# Patient Record
Sex: Female | Born: 1940 | Race: Black or African American | Hispanic: No | Marital: Married | Smoking: Former smoker
Health system: Southern US, Community
[De-identification: ages and names within clinical notes are randomized; demographics above are authoritative.]

## PROBLEM LIST (undated history)

## (undated) DIAGNOSIS — F419 Anxiety disorder, unspecified: Secondary | ICD-10-CM

## (undated) DIAGNOSIS — I2510 Atherosclerotic heart disease of native coronary artery without angina pectoris: Secondary | ICD-10-CM

## (undated) DIAGNOSIS — N186 End stage renal disease: Secondary | ICD-10-CM

## (undated) DIAGNOSIS — I509 Heart failure, unspecified: Secondary | ICD-10-CM

## (undated) DIAGNOSIS — D126 Benign neoplasm of colon, unspecified: Secondary | ICD-10-CM

## (undated) DIAGNOSIS — K648 Other hemorrhoids: Secondary | ICD-10-CM

## (undated) DIAGNOSIS — K449 Diaphragmatic hernia without obstruction or gangrene: Secondary | ICD-10-CM

## (undated) DIAGNOSIS — Z8619 Personal history of other infectious and parasitic diseases: Secondary | ICD-10-CM

## (undated) DIAGNOSIS — I10 Essential (primary) hypertension: Secondary | ICD-10-CM

## (undated) DIAGNOSIS — K5730 Diverticulosis of large intestine without perforation or abscess without bleeding: Secondary | ICD-10-CM

## (undated) DIAGNOSIS — K760 Fatty (change of) liver, not elsewhere classified: Secondary | ICD-10-CM

## (undated) DIAGNOSIS — M5136 Other intervertebral disc degeneration, lumbar region: Secondary | ICD-10-CM

## (undated) DIAGNOSIS — K219 Gastro-esophageal reflux disease without esophagitis: Secondary | ICD-10-CM

## (undated) DIAGNOSIS — I639 Cerebral infarction, unspecified: Secondary | ICD-10-CM

## (undated) DIAGNOSIS — Z8673 Personal history of transient ischemic attack (TIA), and cerebral infarction without residual deficits: Secondary | ICD-10-CM

## (undated) DIAGNOSIS — K429 Umbilical hernia without obstruction or gangrene: Secondary | ICD-10-CM

## (undated) DIAGNOSIS — Z7902 Long term (current) use of antithrombotics/antiplatelets: Secondary | ICD-10-CM

## (undated) DIAGNOSIS — K2970 Gastritis, unspecified, without bleeding: Secondary | ICD-10-CM

## (undated) DIAGNOSIS — G4700 Insomnia, unspecified: Secondary | ICD-10-CM

## (undated) DIAGNOSIS — R430 Anosmia: Secondary | ICD-10-CM

## (undated) DIAGNOSIS — M51369 Other intervertebral disc degeneration, lumbar region without mention of lumbar back pain or lower extremity pain: Secondary | ICD-10-CM

## (undated) DIAGNOSIS — I700 Atherosclerosis of aorta: Secondary | ICD-10-CM

## (undated) DIAGNOSIS — J449 Chronic obstructive pulmonary disease, unspecified: Secondary | ICD-10-CM

## (undated) DIAGNOSIS — E559 Vitamin D deficiency, unspecified: Secondary | ICD-10-CM

## (undated) DIAGNOSIS — E785 Hyperlipidemia, unspecified: Secondary | ICD-10-CM

## (undated) DIAGNOSIS — Z992 Dependence on renal dialysis: Secondary | ICD-10-CM

## (undated) DIAGNOSIS — K222 Esophageal obstruction: Secondary | ICD-10-CM

## (undated) DIAGNOSIS — Z794 Long term (current) use of insulin: Secondary | ICD-10-CM

## (undated) DIAGNOSIS — D631 Anemia in chronic kidney disease: Secondary | ICD-10-CM

## (undated) DIAGNOSIS — C642 Malignant neoplasm of left kidney, except renal pelvis: Secondary | ICD-10-CM

## (undated) DIAGNOSIS — N189 Chronic kidney disease, unspecified: Secondary | ICD-10-CM

## (undated) DIAGNOSIS — D649 Anemia, unspecified: Secondary | ICD-10-CM

## (undated) DIAGNOSIS — M5430 Sciatica, unspecified side: Secondary | ICD-10-CM

## (undated) DIAGNOSIS — E119 Type 2 diabetes mellitus without complications: Secondary | ICD-10-CM

## (undated) DIAGNOSIS — G3184 Mild cognitive impairment, so stated: Secondary | ICD-10-CM

## (undated) DIAGNOSIS — N289 Disorder of kidney and ureter, unspecified: Secondary | ICD-10-CM

## (undated) DIAGNOSIS — C801 Malignant (primary) neoplasm, unspecified: Secondary | ICD-10-CM

## (undated) DIAGNOSIS — I779 Disorder of arteries and arterioles, unspecified: Secondary | ICD-10-CM

## (undated) DIAGNOSIS — D49519 Neoplasm of unspecified behavior of unspecified kidney: Secondary | ICD-10-CM

## (undated) DIAGNOSIS — R0602 Shortness of breath: Secondary | ICD-10-CM

## (undated) DIAGNOSIS — K279 Peptic ulcer, site unspecified, unspecified as acute or chronic, without hemorrhage or perforation: Secondary | ICD-10-CM

## (undated) DIAGNOSIS — N19 Unspecified kidney failure: Secondary | ICD-10-CM

## (undated) DIAGNOSIS — G5690 Unspecified mononeuropathy of unspecified upper limb: Secondary | ICD-10-CM

## (undated) DIAGNOSIS — Z8739 Personal history of other diseases of the musculoskeletal system and connective tissue: Secondary | ICD-10-CM

## (undated) DIAGNOSIS — M545 Low back pain, unspecified: Secondary | ICD-10-CM

## (undated) DIAGNOSIS — E1121 Type 2 diabetes mellitus with diabetic nephropathy: Secondary | ICD-10-CM

## (undated) DIAGNOSIS — G8929 Other chronic pain: Secondary | ICD-10-CM

## (undated) DIAGNOSIS — M109 Gout, unspecified: Secondary | ICD-10-CM

## (undated) DIAGNOSIS — D219 Benign neoplasm of connective and other soft tissue, unspecified: Secondary | ICD-10-CM

## (undated) DIAGNOSIS — C649 Malignant neoplasm of unspecified kidney, except renal pelvis: Secondary | ICD-10-CM

## (undated) DIAGNOSIS — J45909 Unspecified asthma, uncomplicated: Secondary | ICD-10-CM

## (undated) HISTORY — PX: OTHER SURGICAL HISTORY: SHX169

## (undated) HISTORY — DX: Benign neoplasm of connective and other soft tissue, unspecified: D21.9

## (undated) HISTORY — PX: PARTIAL NEPHRECTOMY: SHX414

## (undated) HISTORY — DX: Unspecified mononeuropathy of unspecified upper limb: G56.90

## (undated) HISTORY — DX: Other chronic pain: G89.29

## (undated) HISTORY — DX: Gout, unspecified: M10.9

## (undated) HISTORY — DX: Chronic obstructive pulmonary disease, unspecified: J44.9

## (undated) HISTORY — DX: Personal history of other diseases of the musculoskeletal system and connective tissue: Z87.39

## (undated) HISTORY — DX: Gastro-esophageal reflux disease without esophagitis: K21.9

## (undated) HISTORY — DX: Essential (primary) hypertension: I10

## (undated) HISTORY — DX: Type 2 diabetes mellitus with diabetic nephropathy: E11.21

## (undated) HISTORY — PX: HYSTERECTOMY: SHX81

## (undated) HISTORY — PX: APPENDECTOMY (OPEN): SHX54

## (undated) HISTORY — DX: Hyperlipidemia, unspecified: E78.5

## (undated) HISTORY — DX: Shortness of breath: R06.02

## (undated) HISTORY — DX: Type 2 diabetes mellitus without complications: E11.9

## (undated) HISTORY — DX: Unspecified asthma, uncomplicated: J45.909

## (undated) HISTORY — DX: Low back pain, unspecified: M54.50

## (undated) HISTORY — PX: HERNIA REPAIR: SHX51

## (undated) HISTORY — DX: Heart failure, unspecified: I50.9

## (undated) HISTORY — DX: Umbilical hernia without obstruction or gangrene: K42.9

## (undated) HISTORY — DX: Anosmia: R43.0

## (undated) HISTORY — DX: Gastritis, unspecified, without bleeding: K29.70

## (undated) HISTORY — DX: Personal history of other infectious and parasitic diseases: Z86.19

## (undated) HISTORY — DX: Fatty (change of) liver, not elsewhere classified: K76.0

## (undated) HISTORY — DX: Diverticulosis of large intestine without perforation or abscess without bleeding: K57.30

## (undated) HISTORY — DX: Personal history of transient ischemic attack (TIA), and cerebral infarction without residual deficits: Z86.73

## (undated) HISTORY — PX: APPENDECTOMY: SHX54

## (undated) HISTORY — PX: KIDNEY SURGERY: SHX687

## (undated) HISTORY — DX: Disorder of kidney and ureter, unspecified: N28.9

## (undated) HISTORY — PX: ABDOMINAL HYSTERECTOMY: SHX81

## (undated) HISTORY — PX: HEMORROIDECTOMY: SUR656

## (undated) HISTORY — DX: Esophageal obstruction: K22.2

## (undated) HISTORY — DX: Neoplasm of unspecified behavior of unspecified kidney: D49.519

## (undated) HISTORY — DX: Other hemorrhoids: K64.8

## (undated) HISTORY — DX: Benign neoplasm of colon, unspecified: D12.6

## (undated) NOTE — Progress Notes (Signed)
Formatting of this note might be different from the original.  Spoke to patient over the phone about her diagnosis of cancer and that I have been in touch with her urologist, Dr. Apolinar Junes. Patient has been referred for nephrectomy by Dr. Berneice Heinrich. Patient understands that she will need hemodialysis after this procedure. Patient states she is hesitant to do hemodialysis.     Patient is going to [place] on Saturday. Her hope is to get a second opinion from the surgeon who did her first partial nephrectomy.       Electronically signed by Lamont Dowdy, MD at 05/06/2022  2:58 PM EDT

## (undated) NOTE — Interval H&P Note (Signed)
Formatting of this note might be different from the original.  History and Physical Interval Note:    04/19/2022  10:45 AM    Shelby Barron  has presented today for surgery, with the diagnosis of Bladder Tumor.  The various methods of treatment have been discussed with the patient and family. After consideration of risks, benefits and other options for treatment, the patient has consented to  Procedure(s):  TRANSURETHRAL RESECTION OF BLADDER TUMOR (TURBT) (N/A)  CYSTOSCOPY WITH RETROGRADE PYELOGRAM (Bilateral)  BLADDER INSTILLATION OF GEMCITABINE (N/A) as a surgical intervention.  The patient's history has been reviewed, patient examined, no change in status, stable for surgery.  I have reviewed the patient's chart and labs.  Questions were answered to the patient's satisfaction.      RRR  CTAB    Shelby Barron      Electronically signed by Shelby Scotland, MD at 04/19/2022 10:46 AM EDT    Source Note - Shelby Scotland, MD - 04/07/2022  3:00 PM EDT  Formatting of this note is different from the original.  Images from the original note were not included.      04/07/22    CC:   Chief Complaint   Patient presents with    Cysto     HPI:  49 year old female who presents today for further evaluation of abnormal urinalysis along with painful urination and gross hematuria.  Please see previous notes for details.    Notably, she has been on peritoneal dialysis since 2024.    She is also being followed at [facility] for a renal mass on active surveillance.  They are following her serially with MRI annually.    NED. A&Ox3.    No respiratory distress    Abd soft, NT, ND  Normal external genitalia with patent urethral meatus    Cystoscopy Procedure Note    Patient identification was confirmed, informed consent was obtained, and patient was prepped using Betadine solution.  Lidocaine jelly was administered per urethral meatus.      Procedure:  - Flexible cystoscope introduced, without any difficulty.    - Thorough search of the bladder  revealed:     Initially very cloudy urine.  Urine was aspirated to clear the bladder.  Upon doing so, a large nodular tumor became apparent at the right hemitrigone, right lateral bladder neck which had some superficial calcification and neovascularity.  This is highly suspicious for high-grade neoplasm, measured greater than 2 cm on best estimate.  The trigone was not able to be visualized bilaterally today due to the amount of debris and distortion.    Bimanual exam was performed and there was a nodular area near the location of her tumor on her anterior vaginal wall which is also concerning.    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    Plan to proceed with TURBT, possible bilateral retrograde pyelograms as well as intravesical gemcitabine.  We discussed risk benefits including risk of bleeding, infection, damage surrounding structures, need for further procedures amongst others.    Depending on pathology, she will likely need further staging.  All questions were answered at this time.  Her daughter-in-law was also present by speaker phone today discussing this.    She will need a preop UA/urine culture.  She is also on Plavix will need clearance to hold this.    Shelby Scotland, MD  Electronically signed by Shelby Scotland, MD at 04/07/2022  3:47 PM EDT

## (undated) NOTE — Progress Notes (Signed)
Formatting of this note is different from the original.  Images from the original note were not included.    Perioperative / Anesthesia Services    Pre-Admission Testing Clinical Review / Preoperative Anesthesia Consult    Date: 04/15/22    Patient Demographics:   Name: Shelby Barron  DOB:   Jan 04, 1941  MRN:   [id number]    Planned Surgical Procedure(s):      Case: [id number] Date/Time: 04/19/22 1004    Procedures:       TRANSURETHRAL RESECTION OF BLADDER TUMOR (TURBT)      CYSTOSCOPY WITH RETROGRADE PYELOGRAM (Bilateral)      BLADDER INSTILLATION OF GEMCITABINE    Anesthesia type: General    Pre-op diagnosis: Bladder Tumor    Location: [facility] OR ROOM 10 / [facility] ORS FOR ANESTHESIA GROUP    Surgeons: [name], MD       NOTE: Available PAT nursing documentation and vital signs have been reviewed. Clinical nursing staff has updated patient's PMH/PSHx, current medication list, and drug allergies/intolerances to ensure comprehensive history available to assist in medical decision making as it pertains to the aforementioned surgical procedure and anticipated anesthetic course. Extensive review of available clinical information personally performed. [facility] PMH and PSHx updated with any diagnoses/procedures that  may have been inadvertently omitted during her intake with the pre-admission testing department's nursing staff.    Clinical Discussion:   Shelby Barron is a 71 y.o. female who is submitted for pre-surgical anesthesia review and clearance prior to her undergoing the above procedure. Patient is a Former Smoker (30 pack years; quit 01/2002). 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    Patient is followed by cardiology Darrold Junker, MD). She was last seen in the cardiology clinic on 11/30/2021; notes reviewed. At the time of her clinic visit, patient doing well overall from a cardiovascular perspective.  Patient complaining of exertional dyspnea, which was noted to be stable and at baseline. Patient denied any chest pain, PND, orthopnea, palpitations, significant peripheral edema, weakness, fatigue, vertiginous symptoms, or presyncope/syncope. Patient with a past medical history significant for cardiovascular diagnoses. Documented physical exam was grossly benign, providing no evidence of acute exacerbation and/or decompensation of the patient's known cardiovascular conditions. Of note, complete records regarding patient's past medical history unable for review at time of consult. Information gathered from patient report and from available notes provided by patient's local providers.     Patient suffered a LEFT corona radiata infarct in 2004. Patient was treated at [facility] in [place], [place]. Patient recovered with rehabilitation and therapy. She has no significant lasting neurological deficits following her CVA.     Carotid doppler performed on 11/22/2013 demonstrated MILD (<50%) stenosis of the patient's BILATERAL internal carotid arteries. Vetebral arteries demonstrated antegrade flow.     Patient experienced a TIA in 2017. Again, patient with no residual deficits resulting from neurological event.     Myocardial perfusion imaging study performed on 10/24/2018 revealed a normal left ventricular systolic function with a hyperdynamic LVEF of 71%.  There were no regional wall motion abnormalities.  There was no evidence of stress-induced myocardial ischemia or arrhythmia; no scintigraphic evidence of scar.  Study determined to be normal and low risk.  Most recent TTE was performed on 07/25/2019 revealing a normal left ventricular systolic function with an EF of  60%.  There were no regional wall motion abnormalities.  Right ventricular size and function normal.  Left atrium mildly dilated.  Aortic valve with mildly thickened leaflets and normal excursion.  There was trivial mitral, tricuspid, and pulmonic valve regurgitation.  RVSP normal at 31 mmHg. All transvalvular gradients were noted to be normal providing no evidence suggestive of valvular stenosis.     Following CVA, patient remains on chronic antithrombotic therapy using clopidogrel. Patient is reported to be compliant with therapy with no evidence or reports of GI bleeding. Blood pressure reasonably controlled at 140/78 mmHg on currently prescribed beta-blocker (metoprolol tartrate) and diuretic (spironolactone + torsemide) therapies.  Patient is on atorvastatin for her HLD diagnosis and ASCVD prevention. T2DM loosely controlled on currently prescribed regimen; last HgbA1c was 7.9% when checked on 10/13/2021.  Of note, patient's A1c has been rechecked since she was last seen by cardiology, with worsening to 8.4% on 03/09/2022.  She does not have an OSAH diagnosis. Functional capacity, as defined by DASI, is documented as being >/= 4 METS. No changes were made to her medication regimen during her visit with cardiology.  Patient scheduled to follow-up with outpatient cardiology in 4 months or sooner if needed.    Shelby Barron is scheduled for a TRANSURETHRAL RESECTION OF BLADDER TUMOR (TURBT); CYSTOSCOPY WITH RETROGRADE PYELOGRAM (Bilateral); BLADDER INSTILLATION OF GEMCITABINE on 04/19/2022 with Dr. [name], MD.  Given patient's past medical history significant for cardiovascular diagnoses, presurgical cardiac clearance was sought by the PAT team. Per cardiology, "this patient is optimized for surgery and may proceed with the planned procedural course with a LOW risk of significant perioperative cardiovascular complications".    Again, this patient is on daily antiplatelet therapy. She has been instructed  on recommendations for holding her clopidogrel for 5 days prior to her procedure with plans to restart as soon as postoperative bleeding risk felt to be minimized by her attending surgeon. The patient has been instructed that her last dose of her Clopidogrel should be on 04/13/2022.    Patient denies previous perioperative complications with anesthesia in the past. In review of the available records, it is noted that patient underwent a general anesthetic course at [facility] ([facility]) in 11/2019 without documented complications.       04/13/2022     2:10 PM 02/25/2022     2:17 PM 02/03/2022     1:50 PM   Vitals with BMI   Height 5\' 3"  5\' 3"  5\' 3"    Weight 203 lbs 209 lbs 215 lbs   BMI 35.97 37.03 38.09   Systolic  103 125   Diastolic  60 71   Pulse  83 87     Providers/Specialists:     NOTE: Primary physician provider listed below. Patient may have been seen by APP or partner within same practice.     PROVIDER ROLE / SPECIALTY LAST Jaquelyn Bitter, MD Urology (Surgeon) 04/07/2022   Barbette Reichmann, MD Primary Care Provider 02/24/2022    Marcina Millard, MD Cardiology 11/30/2021   Verdis Frederickson, MD Endocrinology 03/09/2022   Merri Ray, MD Physiatry 11/23/2021   Cristopher Peru, MD Neurology 07/02/2021   Lamont Dowdy, MD Nephrology ???     Allergies:   Crestor [rosuvastatin], Iodinated contrast media, Pioglitazone, Sulfa antibiotics, Sulfonamide derivatives, Losartan, and Sulfasalazine    Current Home Medications:  No current facility-administered medications for this encounter.      acetaminophen (TYLENOL) 500 MG tablet    albuterol (PROVENTIL HFA;VENTOLIN HFA) 108 (90 Base) MCG/ACT inhaler    allopurinol (ZYLOPRIM) 100 MG tablet    ANORO ELLIPTA 62.5-25 MCG/ACT AEPB    atorvastatin (LIPITOR) 40 MG tablet    calcitRIOL (ROCALTROL) 0.25 MCG capsule    ergocalciferol (VITAMIN D2) 50000 UNITS capsule    gentamicin cream (GARAMYCIN) 0.1 %    insulin aspart  (NOVOLOG) 100 UNIT/ML injection    insulin degludec (TRESIBA) 200 UNIT/ML FlexTouch Pen    Magnesium 400 MG TABS    metoprolol tartrate (LOPRESSOR) 25 MG tablet    montelukast (SINGULAIR) 10 MG tablet    multivitamin (RENA-VIT) TABS tablet    mupirocin cream (BACTROBAN) 2 %    nitroGLYCERIN (NITROSTAT) 0.4 MG SL tablet    ondansetron (ZOFRAN-ODT) 4 MG disintegrating tablet    ONETOUCH VERIO test strip    pantoprazole (PROTONIX) 40 MG tablet    PLAVIX 75 MG tablet    potassium chloride (MICRO-K) 10 MEQ CR capsule    pregabalin (LYRICA) 50 MG capsule    sevelamer carbonate (RENVELA) 800 MG tablet    spironolactone (ALDACTONE) 25 MG tablet    torsemide (DEMADEX) 100 MG tablet    zolpidem (AMBIEN) 5 MG tablet    ciprofloxacin (CIPRO) 250 MG tablet    conjugated estrogens (PREMARIN) vaginal cream    hyoscyamine (LEVSIN SL) 0.125 MG SL tablet     History:     Past Medical History:   Diagnosis Date    Adenomatous colon polyp     Allergic rhinitis     Anemia of chronic renal failure     Anxiety     Aortic atherosclerosis (HCC)     Asthma     Bilateral carotid artery disease (HCC)     a.) carotid doppler 11/22/2013: mild (<50%) BICA    CHF (congestive heart failure) (HCC)     a.)TTE 04/13/2012: EF 65-70%, triv MR, G1DD; b.) TTE 03/28/2017: EF >55%, mild LAE, triv AR/MR/PR, mild TR, G1DD; c.) TTE 12/30/2017: EF 60-65%, mild MR, G1DD; d.) TTE 07/25/2019: EF 60%, mild LAE, triv MR/TR/PR    Clear cell renal cell carcinoma, left (HCC)     a.) s/p partial nephrectomy in 2015    COPD (chronic obstructive pulmonary disease) (HCC)     Coronary artery calcification seen on CT scan     DDD (degenerative disc disease), lumbar     Diverticulosis of colon     Esophageal stricture     ESRD on peritoneal dialysis (HCC)     Gastritis     GERD (gastroesophageal reflux disease)     Gout     Hepatic steatosis     Hiatal hernia     History of chickenpox     History of CVA (cerebrovascular accident) 2004    a.) presented with hemiparesis with  slurred speech; imaging revealed LEFT corona radiata small vessel infarct ; carotid doppler and TTE negative; Tx'd at [facility] in [place]; recovered with rehab    Hyperlipidemia     Hypertension     Insomnia     a.) on hyponotic (zolpidem) PRN    Internal hemorrhoids     Long term current use of antithrombotics/antiplatelets     a.) clopidogrel    Loss of smell 2004    due to CVA    Mild cognitive impairment     Periumbilical hernia  a.) s/p repair 11/2019    PUD (peptic ulcer disease)     Sciatica     SOB (shortness of breath)     TIA (transient ischemic attack) 2017    Type 2 diabetes mellitus treated with insulin (HCC)     Vitamin D deficiency      Past Surgical History:   Procedure Laterality Date    ABDOMINAL HYSTERECTOMY      ABDOMINAL HYSTERECTOMY      APPENDECTOMY      CAPD INSERTION N/A 01/31/2019    Procedure: LAPAROSCOPIC INSERTION CONTINUOUS AMBULATORY PERITONEAL DIALYSIS  (CAPD) CATHETER;  Surgeon: Annice Needy, MD;  Location: [facility] ORS;  Service: General;  Laterality: N/A;    CESAREAN SECTION      x 2    COLONOSCOPY WITH PROPOFOL N/A 07/23/2015    Procedure: COLONOSCOPY WITH PROPOFOL;  Surgeon: Scot Jun, MD;  Location: [facility] ENDOSCOPY;  Service: Endoscopy;  Laterality: N/A;    ESOPHAGOGASTRODUODENOSCOPY (EGD) WITH PROPOFOL N/A 07/23/2015    Procedure: ESOPHAGOGASTRODUODENOSCOPY (EGD) WITH PROPOFOL;  Surgeon: Scot Jun, MD;  Location: [facility] ENDOSCOPY;  Service: Endoscopy;  Laterality: N/A;    HEMORROIDECTOMY      LAPAROSCOPIC INCISIONAL / UMBILICAL / VENTRAL HERNIA REPAIR N/A 12/17/2019    Procedure: ROBOTIC SURGICAL SYSTEM  INCISIONAL HERNIA REPAIR; Location[facility]    PARTIAL NEPHRECTOMY Left      Family History   Problem Relation Age of Onset    GER disease Father     Heart failure Father     Colon polyps Father     Ovarian cancer Mother     Colon cancer Other         aunt    Diabetes Other     Colon polyps Other         aunt     CAD Neg Hx      Social History     Tobacco Use    Smoking status: Former     Packs/day: 1.00     Years: 30.00     Additional pack years: 0.00     Total pack years: 30.00     Types: Cigarettes     Quit date: 01/18/2002     Years since quitting: 20.2     Passive exposure: Never    Smokeless tobacco: Never   Vaping Use    Vaping Use: Never used   Substance Use Topics    Alcohol use: No    Drug use: No     Pertinent Clinical Results:   LABS:     Hospital Outpatient Visit on 04/15/2022   Component Date Value Ref Range Status    Sodium 04/15/2022 136  135 - 145 mmol/L Final    Potassium 04/15/2022 4.2  3.5 - 5.1 mmol/L Final    Chloride 04/15/2022 95 (L)  98 - 111 mmol/L Final    CO2 04/15/2022 27  22 - 32 mmol/L Final    Glucose, Bld 04/15/2022 250 (H)  70 - 99 mg/dL Final    Glucose reference range applies only to samples taken after fasting for at least 8 hours.    BUN 04/15/2022 36 (H)  8 - 23 mg/dL Final    Creatinine, Ser 04/15/2022 8.57 (H)  0.44 - 1.00 mg/dL Final    Calcium 16/10/9602 9.3  8.9 - 10.3 mg/dL Final    GFR, Estimated 04/15/2022 4 (L)  >60 mL/min Final    Comment: (NOTE)  Calculated  using the CKD-EPI Creatinine Equation (2021)     Anion gap 04/15/2022 14  5 - 15 Final    Performed at [facility], [address] [place], [place] [address]    WBC 04/15/2022 11.8 (H)  4.0 - 10.5 K/uL Final    RBC 04/15/2022 3.28 (L)  3.87 - 5.11 MIL/uL Final    Hemoglobin 04/15/2022 10.1 (L)  12.0 - 15.0 g/dL Final    HCT 60/45/4098 31.6 (L)  36.0 - 46.0 % Final    MCV 04/15/2022 96.3  80.0 - 100.0 fL Final    MCH 04/15/2022 30.8  26.0 - 34.0 pg Final    MCHC 04/15/2022 32.0  30.0 - 36.0 g/dL Final    RDW 11/91/4782 15.1  11.5 - 15.5 % Final    Platelets 04/15/2022 274  150 - 400 K/uL Final    nRBC 04/15/2022 0.0  0.0 - 0.2 % Final    Performed at [facility], [address] [place], [place] [address]     ECG:  Date: 04/15/2022  Time ECG obtained: 1416 PM  Rate: 84 bpm  Rhythm: normal sinus  Axis  (leads I and aVF): Normal  Intervals: PR 174 ms. QRS 76 ms. QTc 453 ms.  ST segment and T wave changes: No evidence of acute ST segment elevation or depression  Comparison: Similar to previous tracing obtained on 12/09/2020    IMAGING / PROCEDURES:  DG ABD 1 VIEW performed on 01/29/2022  Peritoneal dialysis catheter overlies the LEFT mid abdomen without evidence of kinking.    CT LUMBAR SPINE performed on 12/09/2020  No acute osseous abnormality.  Grade 1 anterolisthesis L4 on L5. Multilevel degenerative changes, worst at L4-L5 and L5-S1. Suspect that there is moderate severe foraminal stenosis left greater than right at L5-S1. There is severe facet arthropathy at L4-L5 and L5-S1.   Hydronephrosis on the right as noted on the prior CT    CT ABDOMEN PELVIS WO CONTRAST performed on 12/09/2020  Moderate right-sided hydronephrosis likely due to a mild UPJ obstruction. No obstructing ureteral calculi or bladder calculi.  Chronic scarring changes involving the left kidney.  Progressive moderate-sized hiatal hernia.  Advanced atherosclerotic calcifications involving the aorta and branch vessels.  Diffuse colonic diverticulosis without findings for acute diverticulitis.  Aortic atherosclerosis   Emphysema    MR ABDOMEN WO CONTRAST performed on 11/28/2020  2 mm lesion in the lower pole left kidney is nearly isointense to renal parenchyma on today's noncontrast exam while this may be a complex cyst, neoplasm could also have this appearance. Close follow-up recommended. MRI with and without contrast using group II gadolinium contrast agent might also be a consideration.  Multiple tiny T2 hyperintensities in both kidneys, incompletely characterized on today's exam without contrast material but likely cysts.  Partial nephrectomy defect left kidney.  Small volume free fluid in the abdomen.    MR LUMBAR SPINE WO CONTRAST performed on 06/06/2020  Moderate L4-L5 and mild L5-S1 spinal canal stenosis due to combination of disc bulge  and severe facet arthrosis.  Severe left L5-S1 neural foraminal stenosis.  Grade 1 L4-5 anterolisthesis due to facet arthrosis.    MR BRAIN WO CONTRAST performed on 06/06/2020  No acute intracranial abnormality.  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    TRANSTHORACIC ECHOCARDIOGRAM performed on 07/25/2019  The left ventricle is normal in size with mildly increased wall thickness.   The left ventricular systolic function is normal, LVEF is  visually estimated at 60%.   The right ventricle is normal in size, with normal systolic function.   The left atrium is mildly dilated in size.   The aortic valve is trileaflet with mildly thickened leaflets with normal excursion.   There is trivial mitral valve regurgitation.   There is trivial tricuspid regurgitation.   Estimated RVSP 31 mmHg.   There is trivial pulmonic regurgitation.   There is no pericardial effusion.     Impression and Plan:   Shelby Barron has been referred for pre-anesthesia review and clearance prior to her undergoing the planned anesthetic and procedural courses. Available labs, pertinent testing, and imaging results were personally reviewed by me in preparation for upcoming operative/procedural course. [facility] medical record has been updated following extensive record review and patient interview with PAT staff.     This patient has been appropriately cleared by cardiology with an overall LOW risk of significant perioperative cardiovascular complications. Based on clinical review performed today (04/15/22), barring any significant acute changes in the patient's overall condition, it is anticipated that she will be able to proceed with the planned surgical intervention. Any acute changes in clinical condition may necessitate her procedure being postponed and/or cancelled. Patient will meet with anesthesia team (MD and/or CRNA) on the day of her procedure for preoperative evaluation/assessment.  Questions regarding anesthetic course will be fielded at that time.     Pre-surgical instructions were reviewed with the patient during her PAT appointment, and questions were fielded to satisfaction by PAT clinical staff. She has been instructed on which medications that she will need to hold prior to surgery, as well as the ones that have been deemed safe/appropriate to take of the day of her procedure. As part of the general education provided by PAT, patient made aware both verbally and in writing, that she would need to abstain from the use of any illegal substances during her perioperative course.  She was advised that failure to follow the provided instructions could necessitate case cancellation or result serious perioperative complications up to and including death. Patient encouraged to contact PAT and/or her surgeon's office to discuss any questions or concerns that may arise prior to surgery; verbalized understanding.     Quentin Mulling, MSN, APRN, FNP-C, CEN  [facility]   Peri-operative Services Nurse Practitioner  Phone: [phone]  Fax: [phone]  04/15/22 3:57 PM    NOTE: This note has been prepared using Dragon dictation software. Despite my best ability to proofread, there is always the potential that unintentional transcriptional errors may still occur from this process.    Electronically signed by Verlee Monte, NP at 04/15/2022  3:57 PM EDT

## (undated) NOTE — Addendum Note (Signed)
Addended by: Consuella Lose on: 04/21/2022 01:23 PM     Modules accepted: Orders      Electronically signed by Consuella Lose, CMA at 04/21/2022  1:23 PM EDT

## (undated) NOTE — Telephone Encounter (Signed)
Formatting of this note might be different from the original.  Pt called office to let you know she has decided to have her surgery in [place].  She said she went for a 2nd opinion.  Electronically signed by Maryan Puls at 05/20/2022  3:58 PM EDT

## (undated) NOTE — Telephone Encounter (Signed)
Formatting of this note might be different from the original.  Received a clearance today from Dr. Marcello Fennel. I Spoke with patient. Patient advised to hold Plavix starting on Wednesday 03/27. Patient verbalized understanding.   Electronically signed by Letta Kocher, CMA at 04/12/2022  1:33 PM EDT

## (undated) NOTE — Anesthesia Procedure Notes (Signed)
Associated Order(s): Anesthesia Airway  Formatting of this note might be different from the original.  Procedure Name: LMA Insertion  Date/Time: 04/19/2022 11:26 AM    Performed by: Merlene Pulling, CRNAPre-anesthesia Checklist: Patient identified, Patient being monitored, Timeout performed, Emergency Drugs available and Suction available  Patient Re-evaluated:Patient Re-evaluated prior to induction  Oxygen Delivery Method: Circle system utilized  Preoxygenation: Pre-oxygenation with 100% oxygen  Induction Type: IV induction  Ventilation: Mask ventilation without difficulty  LMA: LMA inserted  LMA Size: 4.0  Tube type: Oral  Number of attempts: 1  Placement Confirmation: positive ETCO2 and breath sounds checked- equal and bilateral  Tube secured with: Tape  Dental Injury: Teeth and Oropharynx as per pre-operative assessment       Electronically signed by Merlene Pulling, CRNA at 04/19/2022 11:43 AM EDT

## (undated) NOTE — Telephone Encounter (Signed)
Formatting of this note might be different from the original.  Called patient to discuss surgical pathology.  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    Needs CT chest abdomen pelvis unfortunately unable to receive contrast due to renal function.    Will plan to defer her follow-up with me until after her scan is completed.  Please reschedule our postop visit for after the studies completed, hopefully can be expedited.  She is also being presented at tumor board next Thursday.    [name], MD    Electronically signed by [name], MD at 04/21/2022  1:13 PM EDT

## (undated) NOTE — Anesthesia Preprocedure Evaluation (Signed)
Formatting of this note is different from the original.                                    Anesthesia Evaluation   Patient identified by MRN, date of birth, ID band  Patient awake    Reviewed:  Allergy & Precautions, H&P , NPO status , Patient's Chart, lab work & pertinent test results    History of Anesthesia Complications  Negative for: history of anesthetic complications    Airway  Mallampati: III    TM Distance: >3 FB  Neck ROM: full     Dental    (+) Chipped, Dental Advidsory Given    Pulmonary  shortness of breath (for over a year, followed by Pulmonology), with exertion and at rest, asthma , neg sleep apnea, COPD, neg recent URI, former smoker       Cardiovascular  hypertension, (-) angina + CAD and +CHF   (-) Past MI (-) dysrhythmias     10/25/18: normal NM perfusion study    Neuro/Psych  neg Seizures PSYCHIATRIC DISORDERS Anxiety     CVA     GI/Hepatic  Neg liver ROS,GERD  Controlled,,   Endo/Other   diabetes, Insulin Dependent  Morbid obesity   Renal/GU  ESRFRenal disease       Musculoskeletal     Abdominal    Peds   Hematology    (+) Blood dyscrasia, anemia    Anesthesia Other Findings  Past Medical History:  No date: Adenomatous colon polyp  No date: Allergic rhinitis  No date: Asthma  No date: Cancer [facility])      Comment:  kidney  No date: COPD (chronic obstructive pulmonary disease) (HCC)  No date: Diabetes mellitus type II  No date: Diverticulosis of colon  No date: Esophageal stricture  No date: Fatty liver  No date: Gastritis  No date: GERD (gastroesophageal reflux disease)  No date: Gout  No date: History of chickenpox  2004: History of CVA (cerebrovascular accident)  No date: Hyperlipidemia  No date: Hypertension  No date: Internal hemorrhoids  11/2012: Kidney tumor  2004: Loss of smell      Comment:  due to CVA  No date: Periumbilical hernia  No date: Renal insufficiency  No date: Umbilical hernia    Past Surgical History:  No date: ABDOMINAL HYSTERECTOMY  No date: ABDOMINAL HYSTERECTOMY  No date:  APPENDECTOMY  No date: CESAREAN SECTION      Comment:  x 2  07/23/2015: COLONOSCOPY WITH PROPOFOL; N/A      Comment:  Procedure: COLONOSCOPY WITH PROPOFOL;  Surgeon: Scot Jun, MD;  Location: [facility] ENDOSCOPY;  Service:                Endoscopy;  Laterality: N/A;  07/23/2015: ESOPHAGOGASTRODUODENOSCOPY (EGD) WITH PROPOFOL; N/A      Comment:  Procedure: ESOPHAGOGASTRODUODENOSCOPY (EGD) WITH                PROPOFOL;  Surgeon: Scot Jun, MD;  Location: [facility]               ENDOSCOPY;  Service: Endoscopy;  Laterality: N/A;  No date: HEMORROIDECTOMY  No date: KIDNEY SURGERY    BMI     Body Mass Index: 36.67 kg/m        Reproductive/Obstetrics  negative OB ROS  Anesthesia Physical  Anesthesia Plan    ASA: 3    Anesthesia Plan: General     Post-op Pain Management:      Induction: Intravenous    PONV Risk Score and Plan: Ondansetron, Dexamethasone and Treatment may vary due to age or medical condition    Airway Management Planned: LMA and Oral ETT    Additional Equipment:     Intra-op Plan:     Post-operative Plan: Extubation in OR    Informed Consent: I have reviewed the patients History and Physical, chart, labs and discussed the procedure including the risks, benefits and alternatives for the proposed anesthesia with the patient or authorized representative who has indicated his/her understanding and acceptance.     Dental Advisory Given    Plan Discussed with: Anesthesiologist    Anesthesia Plan Comments:      Anesthesia Quick Evaluation    Electronically signed by Lenard Simmer, MD at 04/19/2022  9:11 AM EDT

## (undated) NOTE — Anesthesia Postprocedure Evaluation (Signed)
Formatting of this note is different from the original.   Anesthesia Post Note    Patient: Shelby Barron    Procedure(s) Performed: TRANSURETHRAL RESECTION OF BLADDER TUMOR (TURBT) (Bladder)    Patient location during evaluation: PACU  Anesthesia Type: General  Level of consciousness: awake and alert  Pain management: pain level controlled  Vital Signs Assessment: post-procedure vital signs reviewed and stable  Respiratory status: spontaneous breathing, nonlabored ventilation, respiratory function stable and patient connected to nasal cannula oxygen  Cardiovascular status: blood pressure returned to baseline and stable  Postop Assessment: no apparent nausea or vomiting  Anesthetic complications: no    No notable events documented.    Last Vitals:   Vitals:    04/19/22 1330 04/19/22 1351   BP: (!) 135/58 139/68   Pulse: 70 77   Resp: 14 20   Temp: 36.7 C (!) 36.3 C   SpO2: 96% 96%     Last Pain:   Vitals:    04/19/22 1351   TempSrc: Temporal   PainSc: 0-No pain                   Lenard Simmer      Electronically signed by Lenard Simmer, MD at 04/20/2022 12:59 PM EDT

## (undated) NOTE — Unmapped External Note (Signed)
Formatting of this note is different from the original.  Immediate Anesthesia Transfer of Care Note    Patient: Shelby Barron    Procedure(s) Performed: TRANSURETHRAL RESECTION OF BLADDER TUMOR (TURBT) (Bladder)    Patient Location: PACU    Anesthesia Type:General    Level of Consciousness: awake    Airway & Oxygen Therapy: Patient Spontanous Breathing and Patient connected to face mask oxygen    Post-op Assessment: Report given to RN and Post -op Vital signs reviewed and stable    Post vital signs: Reviewed    Last Vitals:   Vitals Value Taken Time   BP 136/81    Temp 46F    Pulse 78 04/19/22 1251   Resp 15 04/19/22 1251   SpO2 100 % 04/19/22 1251   Vitals shown include unvalidated device data.    Last Pain:   Vitals:    04/19/22 0839   TempSrc: Temporal   PainSc: 0-No pain           Complications: No notable events documented.  Electronically signed by Merlene Pulling, CRNA at 04/19/2022 12:52 PM EDT

## (undated) NOTE — Unmapped External Note (Signed)
Formatting of this note might be different from the original.  Date of procedure: 04/19/22    Preoperative diagnosis:   Bladder tumor, bladder neck and trigone    Postoperative diagnosis:   Same as above    Procedure:  TURBT, large    Surgeon: [name], MD    Anesthesia: General    Complications: None    Intraoperative findings: Very large, greater than 5 cm tumor involving the bladder neck, entirety of the trigone which was necrotic and nodular.  Subtotal resection with residual tumor in.  Bimanual exam highly concerning for T3 disease, nodularity palpable in the anterior vaginal wall.  Unable to identify anatomic structures of the trigone, unable to perform bilateral retrogrades or identify UOs.    EBL: Minimal    Specimens: Bladder tumor    Drains: None    Indication: Shelby Barron is a 51 y.o. patient with .  After reviewing the management options for treatment, he elected to proceed with the above surgical procedure(s). We have discussed the potential benefits and risks of the procedure, side effects of the proposed treatment, the likelihood of the patient achieving the goals of the procedure, and any potential problems that might occur during the procedure or recuperation. Informed consent has been obtained.    Description of procedure:    The patient was taken to the operating room and general anesthesia was induced.  The patient was placed in the dorsal lithotomy position, prepped and draped in the usual sterile fashion, and preoperative antibiotics were administered. A preoperative time-out was performed.     A 26 French resectoscope using blunt angled obturator was introduced per urethra.  Immediately upon entering the bladder, there was a very large nodular tumor identified just within the bladder neck extending from at least the 3:00 to 9 o'clock position invading the trigone.  At first, this appeared to be consistent with a bladder tumor on closer inspection, there was areas where the tumor was  nodular and had a mucosal surface which was somewhat more suspicious for infiltrative or metastatic tumor rather than primary TCC.  There were no appreciable anatomic landmarks with the help identify the trigone.  Bilateral ureteral orifice ease were never able to be identified.    I brought in a bipolar loop and began resecting the tumor.  The tumor was hypervascular to some areas and necrotic and almost a fluffy like appearance in other areas.  There was 1 area of deeper resection and upon resecting in this area, I never did get down to a normal mucosal level.  At this point, I repeated the bimanual exam and nodular tumor was felt at the bladder neck to the vagina which was highly suspicious for T3 disease.  Based on the intraoperative findings as well as physical exam, I was highly suspicious that the tumor had either completely replaced the mucosa or was invading well beyond this.  I did not feel that complete resection would be possible.  As such, it due to the majority of the tumor at least down to 1 level and ended up using a rollerball on the surface of the tumor for hemostasis.  The tumor chips were evacuated from the bladder.  I spent quite a bit of time visualizing in and around right felt that the ureteral orifice ease would be however was never able to identify any anatomy consistent with this as the area appeared to be completely infiltrated.  I was never able to do retrograde pyelograms or place  a ureteral stent.  After reasonable hemostasis was achieved, I elected to abort the remainder of the TURBT.  She was cleaned and dried, repositioned in supine position, or some incision taken the back in stable condition.    I discussed intraoperative findings as well as the concerning exam findings with the patient's daughter.  Will use her pathology to guide further management whether this is for additional resection via staged TURBT, further imaging with staging or PET scan or additional  treatments.    Plan: We will have her follow-up next week.    [name], M.D.    Electronically signed by [name], MD at 04/19/2022  1:17 PM EDT

## (undated) NOTE — Progress Notes (Signed)
Formatting of this note is different from the original.  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  She states she is doing PD without issue, no alarming of her machine or any abnormality.     In fact she has hematuria which is her main complaint, we discussed that she has either a problem with her urethra, bladder or ureter and typically urologists deal with this, in the form of cystoscopy, etc to make a diagnosis.  We discussed the difference, questions were answered.  No issues with her PD.  No issues from my standpoint.    She can follow-up with surgery on a as needed basis.    I have personally spent 45 minutes involved in face-to-face and non-face-to-face activities for this patient on the day of the visit. Professional time spent includes the following activities, in addition to those noted in the documentation: Reviewing notes prior to visit, reviewing all available labs & imaging, then discussing the case with the patient, performing a physical exam, and finally discussing surgical options, postoperative care/diet/management, etc., followed by documentation, completing notes on time and placing any relevant orders.    History of Present Illness:    Reason for Consult: Hematuria    Shelby Barron is a pleasant 57 y.o. year old female who is seen in consultation at the request of Dr. Thedore Mins for evaluation of hematuria.  She is well-known to me, underwent laparoscopic PD catheter placement with omentopexy, and previous umbilical hernia repair.  Had to have recurrent incisional hernia repair repair robotically with mesh definitively, did well from this, approximately 3 years ago.  No  issues and has continued to do PD for all this time.    She continues to do peritoneal dialysis without issue, however apparently she has started to have hematuria.  Seen around Thanksgiving, apparently when she finishes urinating she ends up having blood in her urine.  Placed on antibiotics, thinking this was initially a UTI but she had no resolution of the hematuria.  Seen by urologist with short-term follow-up, to be seen this week per the patient and family.  Referred here for evaluation.    We discussed extensively that she does not in fact have an issue with her peritoneal dialysis, or blood in her dialysate, or issues with her catheter.  In fact she has either a urethral, bladder or ureteral issue, and typically urologist deal with this, in the form of cystoscopy, etc.  We discussed the difference, questions were answered.  No issues with her PD.  No issues from my standpoint.    Past Medical History  Type 2 diabetes  ESRD stage V on dialysis  Hypertension associate with diabetes  Hyperlipidemia, associate with type 2 diabetes  Obesity  Renal cell cancer  GERD  Gout  Asthma  Anxiety  History of stroke    Past Surgical History  C-section x 2  Hysterectomy  Partial nephrectomy  Lap PD catheter omentopexy 2021  Robotic incisional hernia repair with mesh 2021    Allergies  Rosuvastatin, Iodinated diagnostic agents, Losartan, Pioglitazone, Sulfa antibiotics, and Sulfasalazine    Medications  Current Outpatient Medications   Medication Sig Dispense Refill    pantoprazole  sodium (PROTONIX) 40 mg tablet Take one tablet (40 mg dose) by mouth daily.      potassium chloride (MICRO-K) 10 mEq CR capsule Take one capsule (10 mEq dose) by mouth daily.      pregabalin (LYRICA) 100 mg capsule Take one capsule (100 mg dose) by mouth daily.      sevelamer (RENVELA) 800 MG tablet Take two tablets (1,600 mg dose) by mouth 3 (three) times daily with meals.      spironolactone (ALDACTONE) 25 MG tablet Take one tablet (25 mg dose)  by mouth daily.      torsemide (DEMADEX) 100 mg tablet Take one tablet (100 mg dose) by mouth daily.      zolpidem (AMBIEN) 5 MG tablet Take one tablet (5 mg dose) by mouth at bedtime as needed.       No current facility-administered medications for this visit.     Family History  History reviewed. No pertinent family history.    Social History:  Social History     Socioeconomic History    Marital status: Married   Tobacco Use    Smoking status: Never    Smokeless tobacco: Never     Review of Systems:  A complete review of systems was performed; pertinent positives are mentioned in the HPI, otherwise negative.    Vitals:    02/05/22 1523   BP: 125/79   Pulse: 85   Temp: 98 F (36.7 C)     Physical Examination:  General appearance - alert, well appearing, and in no distress  Mental status - alert, oriented to person, place, and time  Chest - clear to auscultation bilaterally  Heart - normal rate, regular rhythm  Abdomen - soft, nontender, nondistended obese, catheter palpated, sign of infection or cellulitis or abnormality, no masses or organomegaly  Neurological - alert, oriented, normal speech, no focal findings or movement disorder noted  Musculoskeletal - no joint tenderness, deformity or swelling  Extremities - peripheral pulses normal, no pedal edema, no clubbing or cyanosis  Skin - normal coloration and turgor, no rashes, no suspicious skin lesions noted  Electronically signed by Lanice Schwab, MD at 02/07/2022  7:59 PM EST

## (undated) NOTE — Progress Notes (Signed)
Formatting of this note is different from the original.  Images from the original note were not included.      I,Amy L Pierron,acting as a scribe for [name], MD.,have documented all relevant documentation on the behalf of [name], MD,as directed by  [name], MD while in the presence of [name], MD.    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     She was taken to the operating room for incomplete TURBT on 04/19/2022. A very large, at least five centimeter, tumor was identified at the bladder neck with an obstructed appearing trigone. UO's were unable to be visualized. Bimanual exam was also abnormal concerning for possible T3 disease.     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 is differentiation invasive into the muscular is appropriate. There was also a decent CIS.     She underwent staging CT scan on 04/29/2022, which showed moderate rate hydroureteronephrosis, which is stable down to the level of the UVJ present for over a year. She had a low density right lower pole renal lesion and small pulmonary nodules measuring up to 5 mm; felt to be likely benign.    She is accompanied today by a few family members both in person and over the phone. She inquired if the liquid in the dialysis tube or any built up toxins would have caused the cancer.     She and her family reports that due to her pain level she is limited with her daily activities and household chores. She is able to bathe and dress herself. For a short time  after the surgery her pain had subsided and she was able to do more chores. Now her pain is back. She mentioned she wants a second opinion from her previous doctor in [place], Dr. Graciela Husbands.     She said after the surgery her back pain has gone away.    PMH:  Past Medical History:   Diagnosis Date    Adenomatous colon polyp     Allergic rhinitis     Anemia of chronic renal failure     Anxiety     Aortic atherosclerosis     Asthma     Bilateral carotid artery disease     a.) carotid doppler 11/22/2013: mild (<50%) BICA    CHF (congestive heart failure)     a.)TTE 04/13/2012: EF 65-70%, triv MR, G1DD; b.) TTE 03/28/2017: EF >55%, mild LAE, triv AR/MR/PR, mild TR, G1DD; c.) TTE 12/30/2017: EF 60-65%, mild MR, G1DD; d.) TTE 07/25/2019: EF 60%, mild LAE, triv MR/TR/PR    Clear cell renal cell carcinoma, left     a.) s/p partial nephrectomy in 2015    COPD (chronic obstructive pulmonary disease)     Coronary artery calcification seen on CT scan     DDD (degenerative disc disease), lumbar     Diverticulosis of colon     Esophageal stricture     ESRD on peritoneal dialysis  Gastritis     GERD (gastroesophageal reflux disease)     Gout     Hepatic steatosis     Hiatal hernia     History of chickenpox     History of CVA (cerebrovascular accident) 2004    a.) presented with hemiparesis with slurred speech; imaging revealed LEFT corona radiata small vessel infarct ; carotid doppler and TTE negative; Tx'd at [facility] in [facility]; recovered with rehab    Hyperlipidemia     Hypertension     Insomnia     a.) on hyponotic (zolpidem) PRN    Internal hemorrhoids     Long term current use of antithrombotics/antiplatelets     a.) clopidogrel    Loss of smell 2004    due to CVA    Mild cognitive impairment     Periumbilical hernia     a.) s/p repair 11/2019    PUD (peptic ulcer disease)     Sciatica     SOB (shortness of breath)     TIA (transient ischemic attack) 2017    Type 2 diabetes mellitus treated with insulin      Vitamin D deficiency      Surgical History:  Past Surgical History:   Procedure Laterality Date    ABDOMINAL HYSTERECTOMY      ABDOMINAL HYSTERECTOMY      APPENDECTOMY      CAPD INSERTION N/A 01/31/2019    Procedure: LAPAROSCOPIC INSERTION CONTINUOUS AMBULATORY PERITONEAL DIALYSIS  (CAPD) CATHETER;  Surgeon: Annice Needy, MD;  Location: [facility] ORS;  Service: General;  Laterality: N/A;    CESAREAN SECTION      x 2    COLONOSCOPY WITH PROPOFOL N/A 07/23/2015    Procedure: COLONOSCOPY WITH PROPOFOL;  Surgeon: Scot Jun, MD;  Location: [facility] ENDOSCOPY;  Service: Endoscopy;  Laterality: N/A;    ESOPHAGOGASTRODUODENOSCOPY (EGD) WITH PROPOFOL N/A 07/23/2015    Procedure: ESOPHAGOGASTRODUODENOSCOPY (EGD) WITH PROPOFOL;  Surgeon: Scot Jun, MD;  Location: [facility] ENDOSCOPY;  Service: Endoscopy;  Laterality: N/A;    HEMORROIDECTOMY      LAPAROSCOPIC INCISIONAL / UMBILICAL / VENTRAL HERNIA REPAIR N/A 12/17/2019    Procedure: ROBOTIC SURGICAL SYSTEM  INCISIONAL HERNIA REPAIR; Location[facility]    PARTIAL NEPHRECTOMY Left     TRANSURETHRAL RESECTION OF BLADDER TUMOR N/A 04/19/2022    Procedure: TRANSURETHRAL RESECTION OF BLADDER TUMOR (TURBT);  Surgeon: [name], MD;  Location: [facility] ORS;  Service: Urology;  Laterality: N/A;     Home Medications:   Allergies as of 05/05/2022         Reactions    Crestor [rosuvastatin]     myopathy    Iodinated Contrast Media Other (See Comments)    Nausea and SOB per patient  On Dialysisi    Pioglitazone     REACTION: weight gain    Sulfa Antibiotics Hives    REACTION: bLISTERS    Sulfonamide Derivatives     REACTION: bLISTERS    Losartan Swelling    Sulfasalazine Hives    REACTION: bLISTERS           Medication List          Accurate as of May 05, 2022  2:23 PM. If you have any questions, ask your nurse or doctor.             acetaminophen 500 MG tablet  Commonly known as: TYLENOL  Take 500 mg by mouth every 6 (six) hours  as  needed.    albuterol 108 (90 Base) MCG/ACT inhaler  Commonly known as: VENTOLIN HFA  2 puffs every 6 hours as needed for wheeze/ cough    allopurinol 100 MG tablet  Commonly known as: ZYLOPRIM  Take 100 mg by mouth daily.    Anoro Ellipta 62.5-25 MCG/ACT Aepb  Generic drug: umeclidinium-vilanterol  TAKE 1 PUFF BY MOUTH EVERY DAY    atorvastatin 40 MG tablet  Commonly known as: LIPITOR  Take 40 mg by mouth at bedtime.    calcitRIOL 0.25 MCG capsule  Commonly known as: ROCALTROL  Take 0.25 mcg by mouth daily.    calcium carbonate 500 MG chewable tablet  Commonly known as: TUMS - dosed in mg elemental calcium  Chew 1 tablet by mouth daily as needed for indigestion or heartburn.    ergocalciferol 1.25 MG (50000 UT) capsule  Commonly known as: VITAMIN D2  Take 50,000 Units by mouth every 30 (thirty) days.    gentamicin cream 0.1 %  Commonly known as: GARAMYCIN  Apply 1 Application topically daily as needed.    HYDROcodone-acetaminophen 5-325 MG tablet  Commonly known as: NORCO/VICODIN  Take 1-2 tablets by mouth every 6 (six) hours as needed for moderate pain.    hyoscyamine 0.125 MG SL tablet  Commonly known as: LEVSIN SL  Place 0.125 mg under the tongue every 6 (six) hours as needed.    insulin aspart 100 UNIT/ML injection  Commonly known as: NovoLOG  Inject 6 Units into the skin daily before supper. Pens  What changed:   when to take this  additional instructions    insulin degludec 200 UNIT/ML FlexTouch Pen  Commonly known as: TRESIBA  Inject 38 Units into the skin daily.    Magnesium 400 MG Tabs  Take 1 tablet by mouth daily.    metoprolol tartrate 25 MG tablet  Commonly known as: LOPRESSOR  Take 12.5 mg by mouth daily.    montelukast 10 MG tablet  Commonly known as: SINGULAIR  TAKE 1 TABLET BY MOUTH EVERYDAY AT BEDTIME    multivitamin Tabs tablet  Take 1 tablet by mouth daily.    mupirocin cream 2 %  Commonly known as: BACTROBAN  2 (two) times daily.    nitroGLYCERIN 0.4 MG SL tablet  Commonly known as:  NITROSTAT  Place 0.4 mg under the tongue every 5 (five) minutes as needed for chest pain.    ondansetron 4 MG disintegrating tablet  Commonly known as: ZOFRAN-ODT  Take 1 tablet (4 mg total) by mouth every 8 (eight) hours as needed.    OneTouch Verio test strip  Generic drug: glucose blood  1 each 3 (three) times daily.    oxybutynin 5 MG tablet  Commonly known as: DITROPAN  Take 1 tablet (5 mg total) by mouth every 8 (eight) hours as needed for bladder spasms.    pantoprazole 40 MG tablet  Commonly known as: PROTONIX  Take 40 mg by mouth 2 (two) times daily.    Plavix 75 MG tablet  Generic drug: clopidogrel  TAKE 1 TABLET BY MOUTH ONCE A DAY    potassium chloride 10 MEQ CR capsule  Commonly known as: MICRO-K  Take 10 mEq by mouth daily.    pregabalin 50 MG capsule  Commonly known as: LYRICA  Take 50 mg by mouth at bedtime.    Premarin vaginal cream  Generic drug: conjugated estrogens  Apply one pea-sized amount around the opening of the urethra daily for 2 weeks, then 3  times weekly moving forward.    sevelamer carbonate 800 MG tablet  Commonly known as: RENVELA  Take 1,600 mg by mouth 2 (two) times daily.    spironolactone 25 MG tablet  Commonly known as: ALDACTONE  Take 25 mg by mouth daily.    torsemide 100 MG tablet  Commonly known as: DEMADEX  Take 100 mg by mouth daily.    zolpidem 5 MG tablet  Commonly known as: AMBIEN  TAKE 1 TABLET BY MOUTH AT BEDTIME AS NEEDED          Allergies:   Allergies   Allergen Reactions    Crestor [Rosuvastatin]      myopathy    Iodinated Contrast Media Other (See Comments)     Nausea and SOB per patient  On Dialysisi    Pioglitazone      REACTION: weight gain    Sulfa Antibiotics Hives     REACTION: bLISTERS    Sulfonamide Derivatives      REACTION: bLISTERS    Losartan Swelling    Sulfasalazine Hives     REACTION: bLISTERS     Family History:  Family History   Problem Relation Age of Onset    GER disease Father     Heart failure Father     Colon polyps Father     Ovarian cancer  Mother     Colon cancer Other         aunt    Diabetes Other     Colon polyps Other         aunt    CAD Neg Hx      Social History:  reports that she quit smoking about 20 years ago. Her smoking use included cigarettes. She has a 30.00 pack-year smoking history. She has never been exposed to tobacco smoke. She has never used smokeless tobacco. She reports that she does not drink alcohol and does not use drugs.    Physical Exam:  BP 109/66   Pulse 76   Ht 5\' 3"  (1.6 m)   Wt 204 lb (92.5 kg)   BMI 36.14 kg/m    Constitutional:  Alert and oriented, No acute distress.  In wheelchair but spry.  HEENT: NC AT, moist mucus membranes.  Trachea midline, no masses.  Neurologic: Grossly intact, no focal deficits, moving all 4 extremities.  Psychiatric: Normal mood and affect.    Pertinent Imaging:    SURGICAL PATHOLOGY   CASE: [id number]  PATIENT: Elfriede Colbaugh    Specimen Submitted:  A. Bladder tumor    Clinical History: Bladder tumor    DIAGNOSIS:  A. 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  - ADJACENT UROTHELIAL CARCINOMA IN SITU.    GROSS DESCRIPTION:  A. Labeled: Bladder tumor  Received: Fresh  Collection time: 12:32 PM on 04/19/2022  Placed into formalin time: 12:38 PM on 04/19/2022  Tissue fragment(s): Multiple  Size: Aggregate, 5.0 x 2.5 x 2.5 cm  Description: Received are tan-brown to pink soft tissue fragments and  blood clot  Entirely submitted in 10 cassettes.    CM 04/19/2022    Final Diagnosis performed by Katherine Mantle, MD.   Electronically signed  04/20/2022 11:22:25AM    Personally reviewed and discussed results with the pathologist.       IMPRESSION:  1. No evidence for metastatic disease in the chest, abdomen, or  pelvis.  2. Moderate right hydroureteronephrosis with ureter dilated down to  the  UVJ. Bladder is decompressed with wall thickening and  irregularity. Gas in the bladder lumen is likely  related to recent  instrumentation. In the absence of recent instrumentation, bladder  infection would be a distinct consideration.  3. 12 mm subcapsular low-density lesion interpolar right kidney is  progressive in the interval. This is most likely a cyst. Continued  attention on restaging studies recommended.  4. Moderate hiatal hernia.  5. Colonic diverticulosis without diverticulitis.  6. Small supraumbilical midline ventral hernia contains only fat.  7. Small bilateral pulmonary nodules measuring up to 5 mm. While  likely benign, continued attention on restaging exam suggested.  8. Aortic Atherosclerosis (ICD10-I70.0) and Emphysema (ICD10-J43.9).      Electronically Signed    By: Kennith Center M.D.    CT chest abdomen pelvis was personally reviewed.  Postsurgical changes appreciated, likely has obstruction of her right distal ureter from malignancy.    Assessment & Plan:      Bladder cancer   - Newly diagnosed. At least T2, probably T3 tcc high-grade with squamous differentiation. Metastatic workup is negative, although in non-contrast study.    - She and her family are curious about causes, mentioned there are various reasons to develop bladder cancer including recurrent infections, extended catheter use, or inflammation of the bladder. In her case none of these reasons seem to pinpoint the cause of hers.   - We discussed given squamous differentiation and her end stage renal disease, chemo and radiation is an inferior treatment option. The family seemed pleased with this since they are aware of adverse side effects to chemo.   - She's pending tumor board discussion on Thursday. Will call her back with our discussion results.    - Have already briefly discussed the case with Dr. Fran Lowes. If she's deemed a surgical candidate he may consider cystectomy and bilateral nephrectomy especially in light of her history of bilateral renal masses and end-stage renal disease to avoid need for ostomy and reconstruction.  Referral given to see him as soon as possible for a consultation.   - Given that she's in end stage renal disease, she'll likely need to transition from peritoneal dialysis to hemodialysis.  Send a message to her nephrologist, Dr. Wynelle Link to make him aware of our discussion today and help facilitate transition to hemodialysis if she is deemed a surgical candidate.   - Her functional status is reasonably good. She's ambulatory. She showers and bathes by herself and can cook small meals. She recently has acquired some assistance at home since she's developed back pain, abdominal pain, presumably from her malignancy.   - Kidney transplant is not an option due to her cancer and not able to be on immunosuppression medication; this was a question brought up by one of her daughters   - She expressed the desire to see her previous urologist in [place] to obtain a second opinion. Explained if he uses Epic he can see all her records. Printed her pathology report and CT scan report for them to have.    - All questions were answered. The family will continue to discuss the options. They will make an appointment with Dr. Berneice Heinrich.     I spent more than 50 total minutes on the day of the encounter including pre-visit review of the medical record, face-to-face time with the patient, and post visit ordering of labs/imaging/tests.    I have reviewed the above documentation for accuracy and completeness, and I agree with the above.  [name], MD    [facility]  [address], [address]  [place], [place] [address]  [phone]      Electronically signed by [name], MD at 05/05/2022  4:46 PM EDT

## (undated) NOTE — Telephone Encounter (Signed)
Formatting of this note might be different from the original.  Spoke to patient and moved her appointment to 05/05/2022. This is after the Ct scan.  Electronically signed by Honor Loh, CMA at 04/21/2022  4:07 PM EDT

---

## 2002-01-18 DIAGNOSIS — R430 Anosmia: Secondary | ICD-10-CM

## 2002-01-18 DIAGNOSIS — Z8673 Personal history of transient ischemic attack (TIA), and cerebral infarction without residual deficits: Secondary | ICD-10-CM

## 2002-01-18 HISTORY — DX: Anosmia: R43.0

## 2002-01-18 HISTORY — DX: Personal history of transient ischemic attack (TIA), and cerebral infarction without residual deficits: Z86.73

## 2003-01-12 DIAGNOSIS — I639 Cerebral infarction, unspecified: Secondary | ICD-10-CM

## 2003-01-12 HISTORY — DX: Cerebral infarction, unspecified: I63.9

## 2003-01-14 ENCOUNTER — Encounter: Payer: Self-pay | Admitting: Interventional Radiology and Diagnostic Radiology

## 2003-01-17 ENCOUNTER — Encounter: Payer: Self-pay | Admitting: Interventional Radiology and Diagnostic Radiology

## 2003-02-05 ENCOUNTER — Ambulatory Visit
Admit: 2003-02-05 | Disposition: A | Discharge status: Home / Self Care | Payer: Self-pay | Source: Ambulatory Visit | Admitting: Internal Medicine

## 2003-08-27 ENCOUNTER — Encounter: Admission: RE | Admit: 2003-08-27 | Discharge: 2003-09-05 | Payer: Self-pay | Admitting: Family Medicine

## 2003-10-02 ENCOUNTER — Encounter: Admission: RE | Admit: 2003-10-02 | Discharge: 2003-10-02 | Payer: Self-pay | Admitting: Endocrinology

## 2003-11-19 ENCOUNTER — Ambulatory Visit: Payer: Self-pay | Admitting: Endocrinology

## 2003-11-21 ENCOUNTER — Encounter: Admission: RE | Admit: 2003-11-21 | Discharge: 2003-11-21 | Payer: Self-pay | Admitting: Family Medicine

## 2003-12-03 ENCOUNTER — Ambulatory Visit: Payer: Self-pay | Admitting: Internal Medicine

## 2003-12-16 ENCOUNTER — Ambulatory Visit: Payer: Self-pay | Admitting: Internal Medicine

## 2004-03-24 ENCOUNTER — Ambulatory Visit: Payer: Self-pay | Admitting: Endocrinology

## 2004-04-07 ENCOUNTER — Ambulatory Visit: Payer: Self-pay | Admitting: Endocrinology

## 2004-04-21 ENCOUNTER — Ambulatory Visit: Payer: Self-pay | Admitting: Internal Medicine

## 2004-06-05 ENCOUNTER — Ambulatory Visit: Payer: Self-pay | Admitting: Internal Medicine

## 2004-06-09 ENCOUNTER — Ambulatory Visit: Payer: Self-pay | Admitting: Internal Medicine

## 2004-06-10 ENCOUNTER — Ambulatory Visit: Payer: Self-pay | Admitting: Internal Medicine

## 2004-06-18 ENCOUNTER — Ambulatory Visit: Payer: Self-pay | Admitting: Internal Medicine

## 2004-06-29 ENCOUNTER — Ambulatory Visit: Payer: Self-pay | Admitting: Internal Medicine

## 2004-07-03 ENCOUNTER — Ambulatory Visit: Payer: Self-pay | Admitting: Internal Medicine

## 2004-07-06 ENCOUNTER — Ambulatory Visit: Payer: Self-pay | Admitting: Internal Medicine

## 2004-07-09 ENCOUNTER — Ambulatory Visit: Payer: Self-pay | Admitting: Internal Medicine

## 2004-07-10 ENCOUNTER — Ambulatory Visit: Payer: Self-pay | Admitting: Cardiology

## 2004-07-10 ENCOUNTER — Ambulatory Visit: Payer: Self-pay | Admitting: Internal Medicine

## 2004-07-15 ENCOUNTER — Ambulatory Visit: Payer: Self-pay | Admitting: Internal Medicine

## 2004-07-22 ENCOUNTER — Ambulatory Visit: Payer: Self-pay | Admitting: Internal Medicine

## 2004-09-04 ENCOUNTER — Ambulatory Visit: Payer: Self-pay | Admitting: Endocrinology

## 2004-09-04 ENCOUNTER — Ambulatory Visit: Payer: Self-pay | Admitting: Internal Medicine

## 2004-09-25 ENCOUNTER — Ambulatory Visit: Payer: Self-pay | Admitting: Internal Medicine

## 2004-09-28 ENCOUNTER — Ambulatory Visit: Payer: Self-pay | Admitting: Internal Medicine

## 2004-10-01 ENCOUNTER — Ambulatory Visit: Payer: Self-pay | Admitting: Internal Medicine

## 2004-10-05 ENCOUNTER — Ambulatory Visit: Payer: Self-pay | Admitting: Internal Medicine

## 2004-10-08 ENCOUNTER — Ambulatory Visit: Payer: Self-pay | Admitting: Internal Medicine

## 2004-10-09 ENCOUNTER — Ambulatory Visit: Payer: Self-pay | Admitting: Endocrinology

## 2004-10-12 ENCOUNTER — Ambulatory Visit: Payer: Self-pay | Admitting: Internal Medicine

## 2004-10-12 ENCOUNTER — Ambulatory Visit: Payer: Self-pay | Admitting: Endocrinology

## 2004-10-15 ENCOUNTER — Ambulatory Visit: Payer: Self-pay | Admitting: Internal Medicine

## 2004-10-19 ENCOUNTER — Ambulatory Visit: Payer: Self-pay | Admitting: Internal Medicine

## 2004-10-23 ENCOUNTER — Ambulatory Visit: Payer: Self-pay | Admitting: Internal Medicine

## 2004-10-26 ENCOUNTER — Ambulatory Visit: Payer: Self-pay | Admitting: Internal Medicine

## 2004-10-29 ENCOUNTER — Ambulatory Visit: Payer: Self-pay | Admitting: Endocrinology

## 2004-10-29 ENCOUNTER — Ambulatory Visit: Payer: Self-pay | Admitting: Internal Medicine

## 2004-11-02 ENCOUNTER — Ambulatory Visit: Payer: Self-pay | Admitting: Internal Medicine

## 2004-11-06 ENCOUNTER — Ambulatory Visit: Payer: Self-pay | Admitting: Internal Medicine

## 2004-11-10 ENCOUNTER — Ambulatory Visit: Payer: Self-pay | Admitting: Internal Medicine

## 2004-11-23 ENCOUNTER — Ambulatory Visit: Payer: Self-pay | Admitting: Internal Medicine

## 2004-11-30 ENCOUNTER — Ambulatory Visit: Payer: Self-pay | Admitting: Internal Medicine

## 2004-12-03 ENCOUNTER — Ambulatory Visit: Payer: Self-pay | Admitting: Internal Medicine

## 2004-12-08 ENCOUNTER — Ambulatory Visit: Payer: Self-pay | Admitting: Endocrinology

## 2004-12-08 ENCOUNTER — Ambulatory Visit: Payer: Self-pay | Admitting: Internal Medicine

## 2004-12-15 ENCOUNTER — Ambulatory Visit: Payer: Self-pay | Admitting: Endocrinology

## 2004-12-15 ENCOUNTER — Ambulatory Visit: Payer: Self-pay | Admitting: Internal Medicine

## 2005-01-08 ENCOUNTER — Ambulatory Visit: Payer: Self-pay | Admitting: Internal Medicine

## 2005-01-12 ENCOUNTER — Ambulatory Visit: Payer: Self-pay | Admitting: Internal Medicine

## 2005-01-15 ENCOUNTER — Ambulatory Visit: Payer: Self-pay | Admitting: Internal Medicine

## 2005-01-20 ENCOUNTER — Ambulatory Visit: Payer: Self-pay | Admitting: Internal Medicine

## 2005-01-22 ENCOUNTER — Ambulatory Visit: Payer: Self-pay | Admitting: Internal Medicine

## 2005-01-22 ENCOUNTER — Ambulatory Visit: Payer: Self-pay | Admitting: Endocrinology

## 2005-02-01 ENCOUNTER — Ambulatory Visit: Payer: Self-pay | Admitting: Internal Medicine

## 2005-02-02 ENCOUNTER — Ambulatory Visit: Payer: Self-pay | Admitting: Pulmonary Disease

## 2005-02-08 ENCOUNTER — Ambulatory Visit: Payer: Self-pay | Admitting: Internal Medicine

## 2005-02-09 ENCOUNTER — Ambulatory Visit: Payer: Self-pay | Admitting: Endocrinology

## 2005-02-16 ENCOUNTER — Ambulatory Visit: Payer: Self-pay | Admitting: Internal Medicine

## 2005-02-23 ENCOUNTER — Ambulatory Visit: Payer: Self-pay | Admitting: Internal Medicine

## 2005-03-04 ENCOUNTER — Ambulatory Visit: Payer: Self-pay | Admitting: Internal Medicine

## 2005-03-09 ENCOUNTER — Ambulatory Visit: Payer: Self-pay | Admitting: Internal Medicine

## 2005-03-16 ENCOUNTER — Ambulatory Visit: Payer: Self-pay | Admitting: Internal Medicine

## 2005-04-19 ENCOUNTER — Ambulatory Visit: Payer: Self-pay | Admitting: Internal Medicine

## 2005-04-20 ENCOUNTER — Ambulatory Visit: Payer: Self-pay | Admitting: Endocrinology

## 2005-04-30 ENCOUNTER — Ambulatory Visit: Payer: Self-pay | Admitting: Internal Medicine

## 2005-06-08 ENCOUNTER — Ambulatory Visit: Payer: Self-pay | Admitting: Internal Medicine

## 2005-06-16 ENCOUNTER — Ambulatory Visit: Payer: Self-pay | Admitting: Internal Medicine

## 2005-06-22 ENCOUNTER — Ambulatory Visit: Payer: Self-pay | Admitting: Internal Medicine

## 2005-07-08 ENCOUNTER — Ambulatory Visit: Payer: Self-pay | Admitting: Internal Medicine

## 2005-07-09 ENCOUNTER — Ambulatory Visit: Payer: Self-pay | Admitting: Endocrinology

## 2005-07-13 ENCOUNTER — Ambulatory Visit: Payer: Self-pay | Admitting: Internal Medicine

## 2005-07-27 ENCOUNTER — Ambulatory Visit: Payer: Self-pay | Admitting: Endocrinology

## 2005-07-29 ENCOUNTER — Ambulatory Visit: Payer: Self-pay | Admitting: Internal Medicine

## 2005-07-30 ENCOUNTER — Ambulatory Visit: Payer: Self-pay | Admitting: Internal Medicine

## 2005-08-06 ENCOUNTER — Ambulatory Visit: Payer: Self-pay | Admitting: Internal Medicine

## 2005-08-10 ENCOUNTER — Ambulatory Visit: Payer: Self-pay | Admitting: Internal Medicine

## 2005-10-21 ENCOUNTER — Ambulatory Visit: Payer: Self-pay | Admitting: Internal Medicine

## 2006-03-11 ENCOUNTER — Ambulatory Visit: Payer: Self-pay | Admitting: Internal Medicine

## 2006-03-16 ENCOUNTER — Ambulatory Visit (HOSPITAL_COMMUNITY): Admission: RE | Admit: 2006-03-16 | Discharge: 2006-03-16 | Payer: Self-pay | Admitting: Internal Medicine

## 2006-03-16 ENCOUNTER — Encounter (INDEPENDENT_AMBULATORY_CARE_PROVIDER_SITE_OTHER): Payer: Self-pay | Admitting: Specialist

## 2006-03-18 ENCOUNTER — Ambulatory Visit: Payer: Self-pay | Admitting: Internal Medicine

## 2006-06-04 ENCOUNTER — Emergency Department: Admit: 2006-06-04 | Payer: Self-pay | Source: Emergency Department

## 2006-06-13 ENCOUNTER — Emergency Department: Payer: Self-pay | Admitting: Emergency Medicine

## 2006-06-16 ENCOUNTER — Ambulatory Visit: Payer: Self-pay | Admitting: Pulmonary Disease

## 2006-08-06 ENCOUNTER — Encounter: Payer: Self-pay | Admitting: Endocrinology

## 2006-08-06 DIAGNOSIS — J45909 Unspecified asthma, uncomplicated: Secondary | ICD-10-CM

## 2006-08-06 DIAGNOSIS — M109 Gout, unspecified: Secondary | ICD-10-CM | POA: Insufficient documentation

## 2006-08-06 DIAGNOSIS — J449 Chronic obstructive pulmonary disease, unspecified: Secondary | ICD-10-CM | POA: Insufficient documentation

## 2006-08-06 DIAGNOSIS — Z8679 Personal history of other diseases of the circulatory system: Secondary | ICD-10-CM | POA: Insufficient documentation

## 2006-08-06 DIAGNOSIS — K5730 Diverticulosis of large intestine without perforation or abscess without bleeding: Secondary | ICD-10-CM | POA: Insufficient documentation

## 2006-08-06 DIAGNOSIS — I129 Hypertensive chronic kidney disease with stage 1 through stage 4 chronic kidney disease, or unspecified chronic kidney disease: Secondary | ICD-10-CM | POA: Insufficient documentation

## 2006-09-15 ENCOUNTER — Ambulatory Visit: Payer: Self-pay | Admitting: Internal Medicine

## 2007-01-18 DIAGNOSIS — J3089 Other allergic rhinitis: Secondary | ICD-10-CM | POA: Insufficient documentation

## 2007-01-18 DIAGNOSIS — J302 Other seasonal allergic rhinitis: Secondary | ICD-10-CM | POA: Insufficient documentation

## 2007-01-20 ENCOUNTER — Ambulatory Visit: Payer: Self-pay | Admitting: Internal Medicine

## 2007-01-26 ENCOUNTER — Ambulatory Visit: Payer: Self-pay | Admitting: Internal Medicine

## 2007-03-21 ENCOUNTER — Encounter: Payer: Self-pay | Admitting: Internal Medicine

## 2007-03-21 ENCOUNTER — Ambulatory Visit: Payer: Self-pay | Admitting: Internal Medicine

## 2007-06-05 ENCOUNTER — Encounter: Payer: Self-pay | Admitting: Endocrinology

## 2007-06-26 ENCOUNTER — Ambulatory Visit: Payer: Self-pay | Admitting: Endocrinology

## 2007-06-26 LAB — CONVERTED CEMR LAB: Hgb A1c MFr Bld: 7 % — ABNORMAL HIGH (ref 4.6–6.0)

## 2007-07-31 LAB — CONVERTED CEMR LAB: Pap Smear: NORMAL

## 2007-08-08 ENCOUNTER — Telehealth: Payer: Self-pay | Admitting: Internal Medicine

## 2007-08-09 ENCOUNTER — Telehealth: Payer: Self-pay | Admitting: Internal Medicine

## 2007-09-06 ENCOUNTER — Encounter: Admission: RE | Admit: 2007-09-06 | Discharge: 2007-09-06 | Payer: Self-pay | Admitting: Family Medicine

## 2007-10-10 ENCOUNTER — Telehealth: Payer: Self-pay | Admitting: Internal Medicine

## 2007-10-16 ENCOUNTER — Telehealth: Payer: Self-pay | Admitting: Internal Medicine

## 2008-01-23 ENCOUNTER — Telehealth: Payer: Self-pay | Admitting: Internal Medicine

## 2008-03-07 ENCOUNTER — Telehealth: Payer: Self-pay | Admitting: Internal Medicine

## 2008-03-14 ENCOUNTER — Telehealth: Payer: Self-pay | Admitting: Internal Medicine

## 2008-03-28 ENCOUNTER — Ambulatory Visit: Payer: Self-pay | Admitting: Internal Medicine

## 2008-03-29 ENCOUNTER — Ambulatory Visit: Payer: Self-pay | Admitting: Internal Medicine

## 2008-03-29 DIAGNOSIS — K222 Esophageal obstruction: Secondary | ICD-10-CM | POA: Insufficient documentation

## 2008-03-29 DIAGNOSIS — Z860100 Personal history of colon polyps, unspecified: Secondary | ICD-10-CM | POA: Insufficient documentation

## 2008-04-02 ENCOUNTER — Ambulatory Visit (HOSPITAL_COMMUNITY): Admission: RE | Admit: 2008-04-02 | Discharge: 2008-04-02 | Payer: Self-pay | Admitting: Internal Medicine

## 2008-04-09 ENCOUNTER — Encounter: Payer: Self-pay | Admitting: Internal Medicine

## 2008-04-09 ENCOUNTER — Ambulatory Visit (HOSPITAL_COMMUNITY): Admission: RE | Admit: 2008-04-09 | Discharge: 2008-04-09 | Payer: Self-pay | Admitting: Internal Medicine

## 2008-04-12 ENCOUNTER — Telehealth (INDEPENDENT_AMBULATORY_CARE_PROVIDER_SITE_OTHER): Payer: Self-pay | Admitting: *Deleted

## 2008-04-29 ENCOUNTER — Ambulatory Visit: Payer: Self-pay | Admitting: Internal Medicine

## 2008-05-15 ENCOUNTER — Encounter: Payer: Self-pay | Admitting: Internal Medicine

## 2008-07-05 LAB — HM COLONOSCOPY

## 2008-07-08 ENCOUNTER — Telehealth (INDEPENDENT_AMBULATORY_CARE_PROVIDER_SITE_OTHER): Payer: Self-pay | Admitting: *Deleted

## 2008-08-19 ENCOUNTER — Telehealth: Payer: Self-pay | Admitting: Internal Medicine

## 2008-08-20 ENCOUNTER — Ambulatory Visit: Payer: Self-pay | Admitting: Gastroenterology

## 2008-08-20 DIAGNOSIS — K219 Gastro-esophageal reflux disease without esophagitis: Secondary | ICD-10-CM | POA: Insufficient documentation

## 2008-08-21 ENCOUNTER — Ambulatory Visit (HOSPITAL_COMMUNITY): Admission: RE | Admit: 2008-08-21 | Discharge: 2008-08-21 | Payer: Self-pay | Admitting: Gastroenterology

## 2008-08-22 ENCOUNTER — Encounter: Payer: Self-pay | Admitting: Physician Assistant

## 2008-08-22 LAB — CONVERTED CEMR LAB
ALT: 24 units/L (ref 0–35)
AST: 31 units/L (ref 0–37)
Alkaline Phosphatase: 57 units/L (ref 39–117)
Basophils Absolute: 0 10*3/uL (ref 0.0–0.1)
Bilirubin Urine: NEGATIVE
CO2: 22 meq/L (ref 19–32)
Creatinine, Ser: 1.6 mg/dL — ABNORMAL HIGH (ref 0.4–1.2)
HCT: 33.6 % — ABNORMAL LOW (ref 36.0–46.0)
Hemoglobin, Urine: NEGATIVE
Hemoglobin: 11.3 g/dL — ABNORMAL LOW (ref 12.0–15.0)
Ketones, ur: NEGATIVE mg/dL
Lymphs Abs: 2.2 10*3/uL (ref 0.7–4.0)
MCHC: 33.6 g/dL (ref 30.0–36.0)
MCV: 86.8 fL (ref 78.0–100.0)
Monocytes Absolute: 0.5 10*3/uL (ref 0.1–1.0)
Monocytes Relative: 6.2 % (ref 3.0–12.0)
Neutro Abs: 5.2 10*3/uL (ref 1.4–7.7)
Nitrite: NEGATIVE
Platelets: 240 10*3/uL (ref 150.0–400.0)
RDW: 14.3 % (ref 11.5–14.6)
Sodium: 138 meq/L (ref 135–145)
Total Bilirubin: 0.6 mg/dL (ref 0.3–1.2)
Total Protein, Urine: NEGATIVE mg/dL
Total Protein: 7.2 g/dL (ref 6.0–8.3)
pH: 5 (ref 5.0–8.0)

## 2008-09-17 ENCOUNTER — Encounter: Payer: Self-pay | Admitting: Internal Medicine

## 2008-09-20 ENCOUNTER — Ambulatory Visit
Admit: 2008-09-20 | Disposition: A | Discharge status: Home / Self Care | Payer: Self-pay | Source: Ambulatory Visit | Admitting: Internal Medicine

## 2008-09-24 ENCOUNTER — Encounter: Payer: Self-pay | Admitting: Internal Medicine

## 2008-10-22 ENCOUNTER — Encounter: Payer: Self-pay | Admitting: Internal Medicine

## 2008-11-21 ENCOUNTER — Telehealth: Payer: Self-pay | Admitting: Internal Medicine

## 2008-11-26 ENCOUNTER — Ambulatory Visit: Payer: Self-pay | Admitting: Internal Medicine

## 2009-04-03 ENCOUNTER — Ambulatory Visit: Payer: Self-pay | Admitting: Internal Medicine

## 2009-04-03 ENCOUNTER — Telehealth (INDEPENDENT_AMBULATORY_CARE_PROVIDER_SITE_OTHER): Payer: Self-pay | Admitting: *Deleted

## 2009-10-30 ENCOUNTER — Ambulatory Visit: Payer: Self-pay | Admitting: Internal Medicine

## 2009-10-30 DIAGNOSIS — E785 Hyperlipidemia, unspecified: Secondary | ICD-10-CM | POA: Insufficient documentation

## 2009-10-30 DIAGNOSIS — F329 Major depressive disorder, single episode, unspecified: Secondary | ICD-10-CM | POA: Insufficient documentation

## 2009-10-30 DIAGNOSIS — N183 Chronic kidney disease, stage 3 unspecified: Secondary | ICD-10-CM | POA: Insufficient documentation

## 2009-10-30 DIAGNOSIS — F3289 Other specified depressive episodes: Secondary | ICD-10-CM | POA: Insufficient documentation

## 2009-10-30 LAB — CONVERTED CEMR LAB
ALT: 16 units/L (ref 0–35)
AST: 16 units/L (ref 0–37)
Albumin: 3.4 g/dL — ABNORMAL LOW (ref 3.5–5.2)
BUN: 23 mg/dL (ref 6–23)
Bilirubin Urine: NEGATIVE
Chloride: 105 meq/L (ref 96–112)
Cholesterol, target level: 200 mg/dL
Cholesterol: 230 mg/dL — ABNORMAL HIGH (ref 0–200)
Creatinine, Ser: 1.4 mg/dL — ABNORMAL HIGH (ref 0.4–1.2)
Direct LDL: 127.7 mg/dL
Glucose, Bld: 78 mg/dL (ref 70–99)
HDL goal, serum: 40 mg/dL
Hemoglobin, Urine: NEGATIVE
Hgb A1c MFr Bld: 6.5 % (ref 4.6–6.5)
Ketones, ur: NEGATIVE mg/dL
Leukocytes, UA: NEGATIVE
Potassium: 5.2 meq/L — ABNORMAL HIGH (ref 3.5–5.1)
Total Protein: 6.6 g/dL (ref 6.0–8.3)
Urobilinogen, UA: 0.2 (ref 0.0–1.0)

## 2009-10-31 ENCOUNTER — Encounter: Payer: Self-pay | Admitting: Internal Medicine

## 2009-11-17 ENCOUNTER — Encounter: Payer: Self-pay | Admitting: Internal Medicine

## 2009-11-19 ENCOUNTER — Telehealth: Payer: Self-pay | Admitting: Internal Medicine

## 2009-12-02 ENCOUNTER — Telehealth (INDEPENDENT_AMBULATORY_CARE_PROVIDER_SITE_OTHER): Payer: Self-pay | Admitting: *Deleted

## 2009-12-05 ENCOUNTER — Ambulatory Visit: Payer: Self-pay | Admitting: Internal Medicine

## 2009-12-24 ENCOUNTER — Ambulatory Visit: Payer: Self-pay | Admitting: Internal Medicine

## 2009-12-24 DIAGNOSIS — R51 Headache: Secondary | ICD-10-CM | POA: Insufficient documentation

## 2009-12-24 DIAGNOSIS — R519 Headache, unspecified: Secondary | ICD-10-CM | POA: Insufficient documentation

## 2009-12-24 DIAGNOSIS — N390 Urinary tract infection, site not specified: Secondary | ICD-10-CM | POA: Insufficient documentation

## 2009-12-24 LAB — CONVERTED CEMR LAB
Calcium: 10.2 mg/dL (ref 8.4–10.5)
Creatinine, Ser: 1.7 mg/dL — ABNORMAL HIGH (ref 0.4–1.2)
GFR calc non Af Amer: 38.11 mL/min — ABNORMAL LOW (ref >60.00)
Glucose, Bld: 102 mg/dL — ABNORMAL HIGH (ref 70–99)
Ketones, urine, test strip: NEGATIVE
Nitrite: NEGATIVE
Protein, U semiquant: >=300
Sed Rate: 51 mm/hr — ABNORMAL HIGH (ref 0–22)
Sodium: 138 meq/L (ref 135–145)
Urobilinogen, UA: 0.2
WBC Urine, dipstick: NEGATIVE
pH: 5

## 2009-12-25 ENCOUNTER — Encounter: Payer: Self-pay | Admitting: Internal Medicine

## 2009-12-25 ENCOUNTER — Telehealth: Payer: Self-pay | Admitting: Internal Medicine

## 2009-12-29 ENCOUNTER — Ambulatory Visit: Payer: Self-pay | Admitting: Internal Medicine

## 2009-12-29 ENCOUNTER — Telehealth: Payer: Self-pay | Admitting: Internal Medicine

## 2009-12-30 ENCOUNTER — Ambulatory Visit (HOSPITAL_COMMUNITY): Admission: RE | Admit: 2009-12-30 | Payer: Self-pay | Source: Home / Self Care | Admitting: Internal Medicine

## 2010-01-02 ENCOUNTER — Ambulatory Visit (HOSPITAL_COMMUNITY)
Admission: RE | Admit: 2010-01-02 | Discharge: 2010-01-02 | Payer: Self-pay | Source: Home / Self Care | Attending: Internal Medicine | Admitting: Internal Medicine

## 2010-01-09 ENCOUNTER — Ambulatory Visit: Payer: Self-pay | Admitting: Internal Medicine

## 2010-01-14 ENCOUNTER — Telehealth: Payer: Self-pay | Admitting: Internal Medicine

## 2010-02-12 ENCOUNTER — Telehealth: Payer: Self-pay | Admitting: Internal Medicine

## 2010-02-17 ENCOUNTER — Telehealth: Payer: Self-pay | Admitting: Internal Medicine

## 2010-02-18 NOTE — Letter (Signed)
Summary: Lipid Letter  [facility]   [place], [place] [address]   Phone: [phone]  Fax: [phone]    10/31/2009  Megan [name] [address] [place],   [address]  Dear Megan [name]:  We have carefully reviewed your last lipid profile from  and the results are noted below with a summary of recommendations for lipid management.    Cholesterol:       230     Goal: <200 too high   HDL "good" Cholesterol:   62.50     Goal: >40   LDL "bad" Cholesterol:   128     Goal: <70   Triglycerides:       354.0     Goal: <150 too high        TLC Diet (Therapeutic Lifestyle Change): Saturated Fats & Transfatty acids should be kept < 7% of total calories ***Reduce Saturated Fats Polyunstaurated Fat can be up to 10% of total calories Monounsaturated Fat Fat can be up to 20% of total calories Total Fat should be no greater than 25-35% of total calories Carbohydrates should be 50-60% of total calories Protein should be approximately 15% of total calories Fiber should be at least 20-30 grams a day ***Increased fiber may help lower LDL Total Cholesterol should be < 200mg /day Consider adding plant stanol/sterols to diet (example: Benacol spread) ***A higher intake of unsaturated fat may reduce Triglycerides and Increase HDL    Adjunctive Measures (may lower LIPIDS and reduce risk of Heart Attack) include: Aerobic Exercise (20-30 minutes 3-4 times a week) Limit Alcohol Consumption Weight Reduction Aspirin 75-81 mg a day by mouth (if not allergic or contraindicated) Dietary Fiber 20-30 grams a day by mouth     Current Medications: 1)    Plavix 75 Mg  Tabs (Clopidogrel bisulfate) .... Take 1 by mouth once daily 2)    Benicar 40 Mg Tabs (Olmesartan medoxomil) .... Take 1 tablet by mouth once a day 3)    Effexor 75 Mg  Tabs (Venlafaxine hcl) .... Take 1 by mouth once daily 4)    Coreg Cr 40 Mg Xr24h-cap (Carvedilol phosphate) .... Take 1 capsule by  mouth once a day 5)    Ambien 5 Mg  Tabs (Zolpidem tartrate) .... Take one at bedtime as needed 6)    Onetouch Ultra Test  Strp (Glucose blood) .... Use as directed to check blood sugar level 7)    Singulair 10 Mg Tabs (Montelukast sodium) .Marland Kitchen.. 1 daily 8)    Pepcid 40 Mg Tabs (Famotidine) .... Take 1 tablet by mouth two times a day 9)    Proventil Hfa 108 (90 Base) Mcg/act Aers (Albuterol sulfate) .... Inhale 2 puffs every four hours as needed 10)    Qvar 80 Mcg/act Aers (Beclomethasone dipropionate) .... 2 puffs two times a day  brush, gargle and rinse after use 11)    Alprazolam 0.5 Mg Tabs (Alprazolam) .... Take 1 tablet by mouth three times a day as needed 12)    Cardizem Cd 300 Mg Xr24h-cap (Diltiazem hcl coated beads) .... Take 1 capsule by mouth once a day 13)    Furosemide 20 Mg Tabs (Furosemide) .... Take 1 tablet by mouth bid 14)    Allopurinol 100 Mg Tabs (Allopurinol) .... One by mouth once daily 15)    Tums  16)    Janumet 50-500 Mg Tabs (Sitagliptin-metformin hcl) .... One by mouth two times a day for diabetes 17)  Crestor 10 Mg Tabs (Rosuvastatin calcium) .... One by mouth once daily for cholesterol  If you have any questions, please call. We appreciate being able to work with you.   Sincerely,    [facility] Janith Lima MD

## 2010-02-18 NOTE — Assessment & Plan Note (Signed)
Summary: URINE/FREQ---STC   Vital Signs:  Patient profile:   70 year old female Menstrual status:  hysterectomy Height:      63 inches Weight:      194 pounds BMI:     34.49 O2 Sat:      96 % on Room air Temp:     97.2 degrees F oral Pulse rate:   63 / minute Pulse rhythm:   regular Resp:     16 per minute BP sitting:   140 / 64  (left arm) Cuff size:   large  Vitals Entered By: Estell Harpin CMA (December 24, 2009 2:56 PM)  Nutrition Counseling: Patient's BMI is greater than 25 and therefore counseled on weight management options.  O2 Flow:  Room air CC: Patient c/o urinary freq and burning x 2wks, also c/o sharp pain on R side of head, Headaches, Dysuria, Lipid Management Is Patient Diabetic? Yes Did you bring your meter with you today? No Pain Assessment Patient in pain? no       Does patient need assistance? Functional Status Self care Ambulation Normal   Primary Care Provider:  Janith Lima MD  CC:  Patient c/o urinary freq and burning x 2wks, also c/o sharp pain on R side of head, Headaches, Dysuria, and Lipid Management.  History of Present Illness:  Headaches      This is a 70 year old woman who presents with Headaches.  The symptoms began >1 year ago.  On a scale of 1 to 10, the intensity is described as a 3.  The patient denies nausea, vomiting, sweats, tearing of eyes, nasal congestion, sinus pain, sinus pressure, photophobia, and phonophobia.  The headache is described as intermittent and sharp.  The location of the pain is unilateral on the right.  High-risk features (red flags) include age >50 years.  The patient denies the following high-risk features: fever, neck pain/stiffness, vision loss or change, focal weakness, altered mental status, rash, trauma, pain worse with exertion, new type of headache, immunosuppression, concomitant infection, and anticoagulation use.  The headaches are precipitated by stress.  Prior treatment has included an  anti-epileptic medication (Lyrica).      Dysuria      The patient also presents with Dysuria.  The symptoms began 2 weeks ago.  The intensity is described as mild.  The patient reports burning with urination, urinary frequency, and urgency, but denies hematuria, vaginal discharge, vaginal itching, and vaginal sores.  The patient denies the following associated symptoms: nausea, vomiting, fever, shaking chills, flank pain, abdominal pain, back pain, and pelvic pain.  Risk factors for urinary tract infection include diabetes.  History is significant for no urinary tract problems.    Her blood sugars have been high 200-300.  Also, she has been having trouble finding words for about one month and is concerned she may have had another CVA.  Lipid Management History:      Positive NCEP/ATP III risk factors include female age 71 years old or older, diabetes, hypertension, and prior stroke (or TIA).  Negative NCEP/ATP III risk factors include HDL cholesterol greater than 60, no family history for ischemic heart disease, non-tobacco-user status, no ASHD (atherosclerotic heart disease), no peripheral vascular disease, and no history of aortic aneurysm.        The patient states that she knows about the "Therapeutic Lifestyle Change" diet.  Her compliance with the TLC diet is poor.  The patient expresses understanding of adjunctive measures for cholesterol lowering.  Adjunctive  measures started by the patient include fiber and limit alcohol consumpton.  She expresses no side effects from her lipid-lowering medication.  The patient denies any symptoms to suggest myopathy or liver disease.     Preventive Screening-Counseling & Management  Alcohol-Tobacco     Alcohol drinks/day: 0     Alcohol Counseling: not indicated; patient does not drink     Smoking Status: quit     Year Quit: 2004     Pack years: 30 years 1 to 2 packs     Tobacco Counseling: to remain off tobacco  products  Hep-HIV-STD-Contraception     Hepatitis Risk: no risk noted     HIV Risk: no risk noted     STD Risk: no risk noted     Dental Visit-last 6 months yes     Dental Care Counseling: to seek dental care; no dental care within six months     SBE monthly: yes     SBE Education/Counseling: to perform regular SBE     Sun Exposure-Excessive: no      Sexual History:  currently monogamous.        Drug Use:  never.        Blood Transfusions:  no.    Clinical Review Panels:  Prevention   Last Mammogram:  normal (03/18/2009)   Last Pap Smear:  Normal (07/31/2007)   Last Colonoscopy:  Normal (06/03/2009)  Immunizations   Last Tetanus Booster:  Historical (01/19/2004)   Last Flu Vaccine:  given (09/18/2009)  Lipid Management   Cholesterol:  230 (10/30/2009)   HDL (good cholesterol):  62.50 (10/30/2009)  Diabetes Management   HgBA1C:  6.5 (10/30/2009)   Creatinine:  1.4 (10/30/2009)   Last Dilated Eye Exam:  normal (11/18/2009)   Last Foot Exam:  yes (12/24/2009)   Last Flu Vaccine:  given (09/18/2009)  CBC   WBC:  8.0 (08/20/2008)   RBC:  3.87 (08/20/2008)   Hgb:  11.3 (08/20/2008)   Hct:  33.6 (08/20/2008)   Platelets:  240.0 (08/20/2008)   MCV  86.8 (08/20/2008)   MCHC  33.6 (08/20/2008)   RDW  14.3 (08/20/2008)   PMN:  64.3 (08/20/2008)   Lymphs:  28.0 (08/20/2008)   Monos:  6.2 (08/20/2008)   Eosinophils:  0.9 (08/20/2008)   Basophil:  0.6 (08/20/2008)  Complete Metabolic Panel   Glucose:  78 (10/30/2009)   Sodium:  137 (10/30/2009)   Potassium:  5.2 (10/30/2009)   Chloride:  105 (10/30/2009)   CO2:  22 (10/30/2009)   BUN:  23 (10/30/2009)   Creatinine:  1.4 (10/30/2009)   Albumin:  3.4 (10/30/2009)   Total Protein:  6.6 (10/30/2009)   Calcium:  9.8 (10/30/2009)   Total Bili:  0.3 (10/30/2009)   Alk Phos:  79 (10/30/2009)   SGPT (ALT):  16 (10/30/2009)   SGOT (AST):  16 (10/30/2009)   Medications Prior to Update: 1)  Plavix 75 Mg  Tabs  (Clopidogrel Bisulfate) .... Take 1 By Mouth Once Daily 2)  Benicar 40 Mg Tabs (Olmesartan Medoxomil) .... Take 1 Tablet By Mouth Once A Day 3)  Effexor 75 Mg  Tabs (Venlafaxine Hcl) .... Take 1 By Mouth Once Daily 4)  Coreg Cr 40 Mg Xr24h-Cap (Carvedilol Phosphate) .... Take 1 Capsule By Mouth Once A Day 5)  Ambien 5 Mg  Tabs (Zolpidem Tartrate) .... Take One At Bedtime As Needed 6)  Onetouch Ultra Test  Strp (Glucose Blood) .... Use As Directed To Check Blood Sugar Level 7)  Singulair  10 Mg Tabs (Montelukast Sodium) .Marland Kitchen.. 1 Daily 8)  Pepcid 40 Mg Tabs (Famotidine) .... Take 1 Tablet By Mouth Two Times A Day 9)  Proventil Hfa 108 (90 Base) Mcg/act Aers (Albuterol Sulfate) .... Inhale 2 Puffs Every Four Hours As Needed 10)  Qvar 80 Mcg/act Aers (Beclomethasone Dipropionate) .... 2 Puffs Two Times A Day  Brush, Gargle and Rinse After Use 11)  Alprazolam 0.5 Mg Tabs (Alprazolam) .... Take 1 Tablet By Mouth Three Times A Day As Needed 12)  Cardizem Cd 300 Mg Xr24h-Cap (Diltiazem Hcl Coated Beads) .... Take 1 Capsule By Mouth Once A Day 13)  Furosemide 20 Mg Tabs (Furosemide) .... Take 1 Tablet By Mouth Bid 14)  Allopurinol 100 Mg Tabs (Allopurinol) .... One By Mouth Once Daily 15)  Tums 16)  Janumet 50-500 Mg Tabs (Sitagliptin-Metformin Hcl) .... One By Mouth Two Times A Day For Diabetes 17)  Crestor 10 Mg Tabs (Rosuvastatin Calcium) .... One By Mouth Once Daily For Cholesterol 18)  Tussionex Pennkinetic Er 10-8 Mg/41ml Lqcr (Hydrocod Polst-Chlorphen Polst) .Marland Kitchen.. 1 Teaspoonful Every 12 Hours As Needed Cough 19)  Zithromax Z-Pak 250 Mg Tabs (Azithromycin) .... 2 Today Then One Daily  Current Medications (verified): 1)  Plavix 75 Mg  Tabs (Clopidogrel Bisulfate) .... Take 1 By Mouth Once Daily 2)  Benicar 40 Mg Tabs (Olmesartan Medoxomil) .... Take 1 Tablet By Mouth Once A Day 3)  Effexor 75 Mg  Tabs (Venlafaxine Hcl) .... Take 1 By Mouth Once Daily 4)  Coreg Cr 40 Mg Xr24h-Cap (Carvedilol Phosphate)  .... Take 1 Capsule By Mouth Once A Day 5)  Ambien 5 Mg  Tabs (Zolpidem Tartrate) .... Take One At Bedtime As Needed 6)  Onetouch Ultra Test  Strp (Glucose Blood) .... Use As Directed To Check Blood Sugar Level 7)  Singulair 10 Mg Tabs (Montelukast Sodium) .Marland Kitchen.. 1 Daily 8)  Pepcid 40 Mg Tabs (Famotidine) .... Take 1 Tablet By Mouth Two Times A Day 9)  Proventil Hfa 108 (90 Base) Mcg/act Aers (Albuterol Sulfate) .... Inhale 2 Puffs Every Four Hours As Needed 10)  Qvar 80 Mcg/act Aers (Beclomethasone Dipropionate) .... 2 Puffs Two Times A Day  Brush, Gargle and Rinse After Use 11)  Cardizem Cd 300 Mg Xr24h-Cap (Diltiazem Hcl Coated Beads) .... Take 1 Capsule By Mouth Once A Day 12)  Furosemide 20 Mg Tabs (Furosemide) .... Take 1 Tablet By Mouth Bid 13)  Allopurinol 100 Mg Tabs (Allopurinol) .... One By Mouth Once Daily As Needed 14)  Tums 15)  Janumet 50-500 Mg Tabs (Sitagliptin-Metformin Hcl) .... One By Mouth Two Times A Day For Diabetes 16)  Crestor 10 Mg Tabs (Rosuvastatin Calcium) .... One By Mouth Once Daily For Cholesterol 17)  Ciprofloxacin Hcl 250 Mg Tabs (Ciprofloxacin Hcl) .... One By Mouth Two Times A Day 18)  Glimepiride 1 Mg Tabs (Glimepiride) .... One By Mouth Once Daily For Diabetes  Allergies (verified): 1)  ! Sulfa 2)  ! 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.1) Hx of ESOPHAGEAL STRICTURE (ICD-530.3) CHICKENPOX, HX OF (ICD-V15.9) ALLERGIC RHINITIS (ICD-477.9) CEREBROVASCULAR ACCIDENT, HX OF (ICD-V12.50) HYPERTENSION (ICD-401.9) GOUT (ICD-274.9) GERD (ICD-530.81) DIVERTICULOSIS, COLON (ICD-562.10) DIABETES MELLITUS, TYPE II (ICD-250.00) COLONIC POLYPS, HYPERPLASTIC, HX OF (ICD-V12.72) ASTHMA (ICD-493.90) Renal insufficiency Hyperlipidemia  Past Surgical History: Last updated:  03/29/2008 Appendectomy Hysterectomy EGD (03/16/2006) Exercise Stress test (12/28/2002) EKG (  211/28/2006) Hemmorhoidectomy C-Section x 2  Family History: Last updated: 2008/04/17 Father had GERD, died of CHF Mother cancer   ovarian Family History of Colon Cancer: Aunt Family History of Colon Polyps: Father, Aunt Family History of Diabetes:   Social History: Last updated: 08/20/2008 Patient is a former smoker. - Stopped in 2004 after CVA Alcohol Use - no Illicit Drug Use - no Occupation: retired Pharmacist, hospital Patient gets regular exercise. Daily Caffeine Use  2-3 per day  Risk Factors: Alcohol Use: 0 (12/24/2009) Exercise: yes (Apr 17, 2008)  Risk Factors: Smoking Status: quit (12/24/2009)  Family History: Reviewed history from 04-17-2008 and no changes required. Father had GERD, died of CHF Mother cancer   ovarian Family History of Colon Cancer: Aunt Family History of Colon Polyps: Father, Aunt Family History of Diabetes:   Social History: Reviewed history from 08/20/2008 and no changes required. Patient is a former smoker. - Stopped in 2004 after CVA Alcohol Use - no Illicit Drug Use - no Occupation: retired Pharmacist, hospital Patient gets regular exercise. Daily Caffeine Use  2-3 per day  Review of Systems       The patient complains of weight gain.  The patient denies anorexia, fever, weight loss, chest pain, syncope, dyspnea on exertion, peripheral edema, prolonged cough, hemoptysis, abdominal pain, hematuria, incontinence, suspicious skin lesions, transient blindness, difficulty walking, depression, and breast masses.   Neuro:  Complains of headaches and memory loss; denies brief paralysis, difficulty with concentration, disturbances in coordination, inability to speak, numbness, poor balance, seizures, sensation of room spinning, tingling, tremors, visual disturbances, and weakness. Endo:  Complains of weight change; denies cold intolerance, excessive hunger, excessive thirst,  excessive urination, heat intolerance, and polyuria.  Physical Exam  General:  alert, well-developed, well-nourished, well-hydrated, appropriate dress, normal appearance, healthy-appearing, cooperative to examination, and overweight-appearing.   Head:  normocephalic, atraumatic, no abnormalities observed, and no abnormalities palpated.   Eyes:  vision grossly intact, pupils equal, pupils round, pupils reactive to light, and no nystagmus.   Mouth:  Oral mucosa and oropharynx without lesions or exudates.  Teeth in good repair. Neck:  supple, full ROM, no masses, no thyromegaly, no JVD, normal carotid upstroke, no carotid bruits, no cervical lymphadenopathy, and no neck tenderness.   Lungs:  normal respiratory effort, no intercostal retractions, no accessory muscle use, normal breath sounds, no dullness, no fremitus, no crackles, and no wheezes.   Heart:  normal rate, regular rhythm, no murmur, no gallop, no rub, and no JVD.   Abdomen:  soft, non-tender, normal bowel sounds, no distention, no masses, no guarding, no rigidity, no rebound tenderness, no abdominal hernia, no inguinal hernia, no hepatomegaly, no splenomegaly, and abdominal scar(s).  No CVAT. Msk:  No deformity or scoliosis noted of thoracic or lumbar spine.   Pulses:  R and L carotid,radial,femoral,dorsalis pedis and posterior tibial pulses are full and equal bilaterally Extremities:  1+ left pedal edema and 1+ right pedal edema.   Neurologic:  No cranial nerve deficits noted. Station and gait are normal. Plantar reflexes are down-going bilaterally. DTRs are symmetrical throughout. Sensory, motor and coordinative functions appear intact. Skin:  turgor normal, color normal, no rashes, no suspicious lesions, no ecchymoses, no petechiae, no purpura, no ulcerations, and no edema.   Cervical Nodes:  no anterior cervical adenopathy and no posterior cervical adenopathy.   Psych:  Cognition and judgment appear intact. Alert and cooperative with  normal attention span and concentration. No apparent delusions, illusions, hallucinations  Diabetes Management Exam:    Foot Exam (with  socks and/or shoes not present):       Sensory-Pinprick/Light touch:          Left medial foot (L-4): normal          Left dorsal foot (L-5): normal          Left lateral foot (S-1): normal          Right medial foot (L-4): normal          Right dorsal foot (L-5): normal          Right lateral foot (S-1): normal       Sensory-Monofilament:          Left foot: normal          Right foot: normal       Inspection:          Left foot: normal          Right foot: normal       Nails:          Left foot: normal          Right foot: normal   Impression & Recommendations:  Problem # 1:  UTI (ICD-599.0) Assessment New  The following medications were removed from the medication list:    Zithromax Z-pak 250 Mg Tabs (Azithromycin) .Marland Kitchen... 2 today then one daily Her updated medication list for this problem includes:    Ciprofloxacin Hcl 250 Mg Tabs (Ciprofloxacin hcl) ..... One by mouth two times a day  Orders: T-Urine Culture (Spectrum Order) [id number])  Problem # 2:  HEADACHE (ICD-784.0) Assessment: New  Her updated medication list for this problem includes:    Coreg Cr 40 Mg Xr24h-cap (Carvedilol phosphate) .Marland Kitchen... Take 1 capsule by mouth once a day  Orders: Venipuncture [id number]) TLB-BMP (Basic Metabolic Panel-BMET) (99991111) TLB-Hemoglobin (Hgb) (85018-HGB) TLB-Sedimentation Rate (ESR) (85652-ESR) Radiology Referral (Radiology)  Problem # 3:  RENAL INSUFFICIENCY (ICD-588.9) Assessment: Unchanged  Orders: Venipuncture [id number]) TLB-BMP (Basic Metabolic Panel-BMET) (99991111) TLB-Hemoglobin (Hgb) (85018-HGB) TLB-Sedimentation Rate (ESR) (85652-ESR)  Problem # 4:  DIABETES MELLITUS, TYPE II (ICD-250.00) Assessment: Deteriorated  The following medications were removed from the medication list:    Actos 15 Mg Tabs (Pioglitazone  hcl) ..... One by mouth once daily Her updated medication list for this problem includes:    Benicar 40 Mg Tabs (Olmesartan medoxomil) .Marland Kitchen... Take 1 tablet by mouth once a day    Janumet 50-500 Mg Tabs (Sitagliptin-metformin hcl) ..... One by mouth two times a day for diabetes    Glimepiride 1 Mg Tabs (Glimepiride) ..... One by mouth once daily for diabetes  Orders: Venipuncture [id number]) TLB-BMP (Basic Metabolic Panel-BMET) (99991111) TLB-Hemoglobin (Hgb) (85018-HGB) TLB-Sedimentation Rate (ESR) (85652-ESR)  Complete Medication List: 1)  Plavix 75 Mg Tabs (Clopidogrel bisulfate) .... Take 1 by mouth once daily 2)  Benicar 40 Mg Tabs (Olmesartan medoxomil) .... Take 1 tablet by mouth once a day 3)  Effexor 75 Mg Tabs (Venlafaxine hcl) .... Take 1 by mouth once daily 4)  Coreg Cr 40 Mg Xr24h-cap (Carvedilol phosphate) .... Take 1 capsule by mouth once a day 5)  Ambien 5 Mg Tabs (Zolpidem tartrate) .... Take one at bedtime as needed 6)  Onetouch Ultra Test Strp (Glucose blood) .... Use as directed to check blood sugar level 7)  Singulair 10 Mg Tabs (Montelukast sodium) .Marland Kitchen.. 1 daily 8)  Pepcid 40 Mg Tabs (Famotidine) .... Take 1 tablet by mouth two times a day 9)  Proventil Hfa 108 (90 Base) Mcg/act Aers (Albuterol sulfate) .Marland KitchenMarland KitchenMarland Kitchen  Inhale 2 puffs every four hours as needed 10)  Qvar 80 Mcg/act Aers (Beclomethasone dipropionate) .... 2 puffs two times a day  brush, gargle and rinse after use 11)  Cardizem Cd 300 Mg Xr24h-cap (Diltiazem hcl coated beads) .... Take 1 capsule by mouth once a day 12)  Furosemide 20 Mg Tabs (Furosemide) .... Take 1 tablet by mouth bid 13)  Allopurinol 100 Mg Tabs (Allopurinol) .... One by mouth once daily as needed 14)  Tums  15)  Janumet 50-500 Mg Tabs (Sitagliptin-metformin hcl) .... One by mouth two times a day for diabetes 16)  Crestor 10 Mg Tabs (Rosuvastatin calcium) .... One by mouth once daily for cholesterol 17)  Ciprofloxacin Hcl 250 Mg Tabs  (Ciprofloxacin hcl) .... One by mouth two times a day 18)  Glimepiride 1 Mg Tabs (Glimepiride) .... One by mouth once daily for diabetes  Other Orders: UA Dipstick w/o Micro (manual) (81002)  Lipid Assessment/Plan:      Based on NCEP/ATP III, the patient's risk factor category is "history of coronary disease, peripheral vascular disease, cerebrovascular disease, or aortic aneurysm along with either diabetes, current smoker, or LDL > 130 plus HDL < 40 plus triglycerides > 200".  The patient's lipid goals are as follows: Total cholesterol goal is 200; LDL cholesterol goal is 70; HDL cholesterol goal is 40; Triglyceride goal is 150.    Patient Instructions: 1)  Please schedule a follow-up appointment in 1 month. 2)  It is important that you exercise regularly at least 20 minutes 5 times a week. If you develop chest pain, have severe difficulty breathing, or feel very tired , stop exercising immediately and seek medical attention. 3)  You need to lose weight. Consider a lower calorie diet and regular exercise.  4)  Check your blood sugars regularly. If your readings are usually above 200 or below 70 you should contact our office. 5)  It is important that your Diabetic A1c level is checked every 3 months. 6)  See your eye doctor yearly to check for diabetic eye damage. 7)  Check your feet each night for sore areas, calluses or signs of infection. 8)  Check your Blood Pressure regularly. If it is above 130/80: you should make an appointment. 9)  Take your antibiotic as prescribed until ALL of it is gone, but stop if you develop a rash or swelling and contact our office as soon as possible. Prescriptions: GLIMEPIRIDE 1 MG TABS (GLIMEPIRIDE) One by mouth once daily for diabetes  #30 x 11   Entered and Authorized by:   Janith Lima MD   Signed by:   Janith Lima MD on 12/24/2009   Method used:   Electronically to        [facility]  [address] [place] (retail)       [place], [place]  [address]       Ph: [phone] or [phone]       Fax: [phone]   RxID:   [id number] CIPROFLOXACIN HCL 250 MG TABS (CIPROFLOXACIN HCL) One by mouth two times a day  #10 x 1   Entered and Authorized by:   Janith Lima MD   Signed by:   Janith Lima MD on 12/24/2009   Method used:   [facility]ically to        [facility]  [phone][address] (retail)       [place],  [place]  [address]       Ph: [phone] or [phone]       Fax: [phone]   [id number] ACTOS 15 MG TABS (PIOGLITAZONE HCL) One by mouth once daily  #84 x 0   Entered and Authorized by:   Janith Lima MD   Signed by:   Janith Lima MD on 12/24/2009   Method used:   Samples Given   RxID:   [id number]    Orders Added: 1)  UA Dipstick w/o Micro (manual) [81002] 2)  T-Urine Culture (Spectrum Order) [id number] 3)  Venipuncture [id number] 4)  TLB-BMP (Basic Metabolic Panel-BMET) [id number] 5)  TLB-Hemoglobin (Hgb) [85018-HGB] 6)  TLB-Sedimentation Rate (ESR) [85652-ESR] 7)  Radiology Referral [Radiology] 8)  Est. Patient Level IV [id number]    Laboratory Results   Urine Tests  Date/Time Received: Estell Harpin CMA  December 24, 2009 3:13 PM  Date/Time Reported: Estell Harpin CMA  December 24, 2009 3:13 PM   Routine Urinalysis   Color: lt. yellow Appearance: Clear Glucose: negative   (Normal Range: Negative) Bilirubin: negative   (Normal Range: Negative) Ketone: negative   (Normal Range: Negative) Spec. Gravity: >=1.030   (Normal Range: 1.003-1.035) Blood: small   (Normal Range: Negative) pH: 5.0   (Normal Range: 5.0-8.0) Protein: >=300   (Normal Range: Negative) Urobilinogen: 0.2   (Normal Range: 0-1) Nitrite: negative   (Normal Range: Negative) Leukocyte Esterace: negative   (Normal Range: Negative)

## 2010-02-18 NOTE — Assessment & Plan Note (Signed)
Summary: NEW /MEDICARE/UHC/TRICARE/ MOVING BACK TO [place]  #   Vital Signs:  Patient profile:   70 year old female Menstrual status:  hysterectomy Height:      63 inches Weight:      187 pounds BMI:     33.25 O2 Sat:      97 % on Room air Temp:     97.2 degrees F oral Pulse rate:   60 / minute Pulse rhythm:   regular Resp:     16 per minute BP sitting:   140 / 72  (left arm) Cuff size:   large  Vitals Entered By: Estell Harpin CMA (October 30, 2009 1:18 PM)  Nutrition Counseling: Patient's BMI is greater than 25 and therefore counseled on weight management options.  O2 Flow:  Room air CC: new to establish, Preventive Care, Lipid Management, Abdominal Pain Is Patient Diabetic? Yes Did you bring your meter with you today? No Pain Assessment Patient in pain? no       Does patient need assistance? Functional Status Self care Ambulation Normal     Menstrual Status hysterectomy Last PAP Result Normal   Primary Care Provider:  Janith Lima MD  CC:  new to establish, Preventive Care, Lipid Management, and Abdominal Pain.  History of Present Illness:  Follow-Up Visit      This is a 70 year old woman who presents for Follow-up visit.  The patient denies chest pain, palpitations, dizziness, syncope, low blood sugar symptoms, high blood sugar symptoms, edema, SOB, DOE, PND, and orthopnea.  Since the last visit the patient notes no new problems or concerns and being seen by a specialist.  The patient reports taking meds as prescribed, monitoring BP, monitoring blood sugars, and dietary compliance.  When questioned about possible medication side effects, the patient notes none.    Dyspepsia History:      She has no alarm features of dyspepsia including no history of melena, hematochezia, dysphagia, persistent vomiting, or involuntary weight loss > 5%.  There is a prior history of GERD.  The patient does not have a prior history of documented ulcer disease.  The dominant  symptom is heartburn or acid reflux.  An H-2 blocker medication is currently being taken.  She notes that the symptoms have improved with the H-2 blocker therapy.  Symptoms have not persisted after 4 weeks of H-2 blocker treatment.  A prior EGD has been done which showed moderate or severe esophagitis.    Lipid Management History:      Positive NCEP/ATP III risk factors include female age 42 years old or older, diabetes, hypertension, and prior stroke (or TIA).  Negative NCEP/ATP III risk factors include no family history for ischemic heart disease, non-tobacco-user status, no ASHD (atherosclerotic heart disease), no peripheral vascular disease, and no history of aortic aneurysm.        The patient states that she knows about the "Therapeutic Lifestyle Change" diet.  Her compliance with the TLC diet is fair.  Adjunctive measures started by the patient include aerobic exercise, fiber, limit alcohol consumpton, and weight reduction.  She expresses no side effects from her lipid-lowering medication.  The patient denies any symptoms to suggest myopathy or liver disease.    Preventive Screening-Counseling & Management  Alcohol-Tobacco     Alcohol drinks/day: 0     Alcohol Counseling: not indicated; patient does not drink     Smoking Status: quit     Year Quit: 2004     Pack years: 91  years 1 to 2 packs     Tobacco Counseling: to remain off tobacco products  Hep-HIV-STD-Contraception     Hepatitis Risk: no risk noted     HIV Risk: no risk noted     STD Risk: no risk noted     Dental Visit-last 6 months yes     Dental Care Counseling: to seek dental care; no dental care within six months     SBE monthly: yes     SBE Education/Counseling: to perform regular SBE     Sun Exposure-Excessive: no  Safety-Violence-Falls     Seat Belt Use: yes     Helmet Use: n/a     Firearms in the Home: no firearms in the home     Smoke Detectors: yes     Violence in the Home: no risk noted     Sexual Abuse:  no      Sexual History:  currently monogamous.        Drug Use:  never.        Blood Transfusions:  no.    Clinical Review Panels:  Prevention   Last Mammogram:  normal (03/18/2009)   Last Pap Smear:  Normal (07/31/2007)   Last Colonoscopy:  Normal (06/03/2009)  Immunizations   Last Tetanus Booster:  Historical (01/19/2004)   Last Flu Vaccine:  given (09/18/2009)  Diabetes Management   HgBA1C:  7.0 (06/26/2007)   Creatinine:  1.6 (08/20/2008)   Last Dilated Eye Exam:  normal (10/08/2008)   Last Foot Exam:  yes (10/30/2009)   Last Flu Vaccine:  given (09/18/2009)  CBC   WBC:  8.0 (08/20/2008)   RBC:  3.87 (08/20/2008)   Hgb:  11.3 (08/20/2008)   Hct:  33.6 (08/20/2008)   Platelets:  240.0 (08/20/2008)   MCV  86.8 (08/20/2008)   MCHC  33.6 (08/20/2008)   RDW  14.3 (08/20/2008)   PMN:  64.3 (08/20/2008)   Lymphs:  28.0 (08/20/2008)   Monos:  6.2 (08/20/2008)   Eosinophils:  0.9 (08/20/2008)   Basophil:  0.6 (08/20/2008)  Complete Metabolic Panel   Glucose:  121 (08/20/2008)   Sodium:  138 (08/20/2008)   Potassium:  4.6 (08/20/2008)   Chloride:  106 (08/20/2008)   CO2:  22 (08/20/2008)   BUN:  28 (08/20/2008)   Creatinine:  1.6 (08/20/2008)   Albumin:  3.7 (08/20/2008)   Total Protein:  7.2 (08/20/2008)   Calcium:  9.3 (08/20/2008)   Total Bili:  0.6 (08/20/2008)   Alk Phos:  57 (08/20/2008)   SGPT (ALT):  24 (08/20/2008)   SGOT (AST):  31 (08/20/2008)   Medications Prior to Update: 1)  Plavix 75 Mg  Tabs (Clopidogrel Bisulfate) .... Take 1 By Mouth Once Daily 2)  Benicar Hct 40-25 Mg  Tabs (Olmesartan Medoxomil-Hctz) .... Take 1 By Mouth Once Daily 3)  Januvia 100 Mg  Tabs (Sitagliptin Phosphate) .... Take 1 By Mouth Once Daily 4)  Glucophage 500 Mg  Tabs (Metformin Hcl) .... Take 2 By Mouth Two Times A Day Qd 5)  Effexor 75 Mg  Tabs (Venlafaxine Hcl) .... Take 1 By Mouth Once Daily 6)  Coreg Cr 40 Mg Xr24h-Cap (Carvedilol Phosphate) .... Take 1 Capsule By  Mouth Once A Day 7)  Ambien 5 Mg  Tabs (Zolpidem Tartrate) .... Take 1/2 At Bedtime As Needed 8)  Onetouch Ultra Test  Strp (Glucose Blood) .... Use As Directed To Check Blood Sugar Level 9)  Singulair 10 Mg Tabs (Montelukast Sodium) .Marland Kitchen.. 1 Daily 10)  Pepcid 40 Mg Tabs (Famotidine) .... Take 1 Tablet By Mouth Two Times A Day 11)  Proventil Hfa 108 (90 Base) Mcg/act Aers (Albuterol Sulfate) .... Inhale 2 Puffs Every Four Hours As Needed 12)  Qvar 80 Mcg/act Aers (Beclomethasone Dipropionate) .... 2 Puffs Two Times A Day  Brush, Gargle and Rinse After Use 13)  Alprazolam 0.5 Mg Tabs (Alprazolam) .... Take 1 Tablet By Mouth Three Times A Day As Needed 14)  Tussionex Pennkinetic Er 8-10 Mg/68ml Lqcr (Chlorpheniramine-Hydrocodone) .... 1/2-1 Tsp Every 12 Hr As Needed Cough 15)  Zithromax Tri-Pak 500 Mg Tabs (Azithromycin) .Marland Kitchen.. 1 By Mouth Once Daily 16)  Cardizem Cd 300 Mg Xr24h-Cap (Diltiazem Hcl Coated Beads) .... Take 1 Capsule By Mouth Once A Day  Current Medications (verified): 1)  Plavix 75 Mg  Tabs (Clopidogrel Bisulfate) .... Take 1 By Mouth Once Daily 2)  Benicar 40 Mg Tabs (Olmesartan Medoxomil) .... Take 1 Tablet By Mouth Once A Day 3)  Effexor 75 Mg  Tabs (Venlafaxine Hcl) .... Take 1 By Mouth Once Daily 4)  Coreg Cr 40 Mg Xr24h-Cap (Carvedilol Phosphate) .... Take 1 Capsule By Mouth Once A Day 5)  Ambien 5 Mg  Tabs (Zolpidem Tartrate) .... Take 1/2 At Bedtime As Needed 6)  Onetouch Ultra Test  Strp (Glucose Blood) .... Use As Directed To Check Blood Sugar Level 7)  Singulair 10 Mg Tabs (Montelukast Sodium) .Marland Kitchen.. 1 Daily 8)  Pepcid 40 Mg Tabs (Famotidine) .... Take 1 Tablet By Mouth Two Times A Day 9)  Proventil Hfa 108 (90 Base) Mcg/act Aers (Albuterol Sulfate) .... Inhale 2 Puffs Every Four Hours As Needed 10)  Qvar 80 Mcg/act Aers (Beclomethasone Dipropionate) .... 2 Puffs Two Times A Day  Brush, Gargle and Rinse After Use 11)  Alprazolam 0.5 Mg Tabs (Alprazolam) .... Take 1 Tablet By  Mouth Three Times A Day As Needed 12)  Cardizem Cd 300 Mg Xr24h-Cap (Diltiazem Hcl Coated Beads) .... Take 1 Capsule By Mouth Once A Day 13)  Furosemide 20 Mg Tabs (Furosemide) .... Take 1 Tablet By Mouth Once A Day 14)  Allopurinol 100 Mg Tabs (Allopurinol) .... As Needed 15)  Tums 16)  Janumet 50-500 Mg Tabs (Sitagliptin-Metformin Hcl) .... One By Mouth Two Times A Day For Diabetes 17)  Crestor 10 Mg Tabs (Rosuvastatin Calcium) .... One By Mouth Once Daily For Cholesterol  Allergies (verified): 1)  ! Sulfa  Past History:  Family History: Last updated: 04-27-2008 Father had GERD, died of CHF Mother cancer   ovarian Family History of Colon Cancer: Aunt Family History of Colon Polyps: Father, Aunt Family History of Diabetes:   Social History: Last updated: 08/20/2008 Patient is a former smoker. - Stopped in 2004 after CVA Alcohol Use - no Illicit Drug Use - no Occupation: retired Pharmacist, hospital Patient gets regular exercise. Daily Caffeine Use  2-3 per day  Risk Factors: Alcohol Use: 0 (10/30/2009) Exercise: yes (27-Apr-2008)  Risk Factors: Smoking Status: quit (10/30/2009)  Past Medical History: Cerebrovascular accident, hx of (2004_ Loss of smell due 2 cva (2004) NonCardiogenic 12/2002) GASTRITIS, HX OF (123XX123) UMBILICAL HERNIA (AB-123456789.1) Hx of ESOPHAGEAL STRICTURE (ICD-530.3) CHICKENPOX, HX OF (ICD-V15.9) ALLERGIC RHINITIS (ICD-477.9) CEREBROVASCULAR ACCIDENT, HX OF (ICD-V12.50) HYPERTENSION (ICD-401.9) GOUT (ICD-274.9) GERD (ICD-530.81) DIVERTICULOSIS, COLON (ICD-562.10) DIABETES MELLITUS, TYPE II (ICD-250.00) COLONIC POLYPS, HYPERPLASTIC, HX OF (ICD-V12.72) ASTHMA (ICD-493.90) Renal insufficiency Hyperlipidemia  Past Surgical History: Reviewed history from April 27, 2008 and no changes required. Appendectomy Hysterectomy EGD (03/16/2006) Exercise Stress test (12/28/2002) EKG (211/28/2006) Hemmorhoidectomy C-Section x  2  Family History: Reviewed history  from 03/29/2008 and no changes required. Father had GERD, died of CHF Mother cancer   ovarian Family History of Colon Cancer: Aunt Family History of Colon Polyps: Father, Aunt Family History of Diabetes:   Social History: Reviewed history from 08/20/2008 and no changes required. Patient is a former smoker. - Stopped in 2004 after CVA Alcohol Use - no Illicit Drug Use - no Occupation: retired Pharmacist, hospital Patient gets regular exercise. Daily Caffeine Use  2-3 per day Hepatitis Risk:  no risk noted HIV Risk:  no risk noted STD Risk:  no risk noted Dental Care w/in 6 mos.:  yes Sun Exposure-Excessive:  no Seat Belt Use:  yes Sexual History:  currently monogamous Drug Use:  never Blood Transfusions:  no  Review of Systems       The patient complains of weight gain.  The patient denies anorexia, fever, weight loss, chest pain, syncope, dyspnea on exertion, peripheral edema, prolonged cough, headaches, hemoptysis, abdominal pain, melena, hematochezia, severe indigestion/heartburn, hematuria, suspicious skin lesions, transient blindness, difficulty walking, unusual weight change, abnormal bleeding, enlarged lymph nodes, and angioedema.   Resp:  Denies chest pain with inspiration, cough, coughing up blood, pleuritic, shortness of breath, sputum productive, and wheezing. Psych:  Complains of anxiety; denies depression, easily angered, easily tearful, irritability, mental problems, panic attacks, suicidal thoughts/plans, thoughts of violence, unusual visions or sounds, and thoughts /plans of harming others. Endo:  Denies cold intolerance, excessive hunger, excessive thirst, excessive urination, heat intolerance, and polyuria.  Physical Exam  General:  alert, well-developed, well-nourished, well-hydrated, appropriate dress, normal appearance, healthy-appearing, cooperative to examination, and overweight-appearing.   Head:  normocephalic, atraumatic, no abnormalities observed, and no abnormalities  palpated.   Eyes:  vision grossly intact, pupils equal, pupils round, and pupils reactive to light.   Mouth:  Oral mucosa and oropharynx without lesions or exudates.  Teeth in good repair. Neck:  supple, full ROM, no masses, no thyromegaly, no JVD, normal carotid upstroke, no carotid bruits, no cervical lymphadenopathy, and no neck tenderness.   Lungs:  normal respiratory effort, no intercostal retractions, no accessory muscle use, normal breath sounds, no dullness, no fremitus, no crackles, and no wheezes.   Heart:  normal rate, regular rhythm, no murmur, no gallop, no rub, and no JVD.   Abdomen:  soft, non-tender, normal bowel sounds, no distention, no masses, no guarding, no rigidity, no rebound tenderness, no abdominal hernia, no inguinal hernia, no hepatomegaly, no splenomegaly, and abdominal scar(s).   Msk:  No deformity or scoliosis noted of thoracic or lumbar spine.   Pulses:  R and L carotid,radial,femoral,dorsalis pedis and posterior tibial pulses are full and equal bilaterally Extremities:  1+ left pedal edema and 1+ right pedal edema.   Neurologic:  No cranial nerve deficits noted. Station and gait are normal. Plantar reflexes are down-going bilaterally. DTRs are symmetrical throughout. Sensory, motor and coordinative functions appear intact. Skin:  turgor normal, color normal, no rashes, no suspicious lesions, no ecchymoses, no petechiae, no purpura, no ulcerations, and no edema.   Cervical Nodes:  no anterior cervical adenopathy and no posterior cervical adenopathy.   Axillary Nodes:  no R axillary adenopathy and no L axillary adenopathy.   Inguinal Nodes:  no R inguinal adenopathy and no L inguinal adenopathy.   Psych:  Cognition and judgment appear intact. Alert and cooperative with normal attention span and concentration. No apparent delusions, illusions, hallucinations  Diabetes Management Exam:    Foot Exam (with socks and/or  shoes not present):       Sensory-Pinprick/Light  touch:          Left medial foot (L-4): normal          Left dorsal foot (L-5): normal          Left lateral foot (S-1): normal          Right medial foot (L-4): normal          Right dorsal foot (L-5): normal          Right lateral foot (S-1): normal       Sensory-Monofilament:          Left foot: normal          Right foot: normal       Inspection:          Left foot: normal          Right foot: normal       Nails:          Left foot: normal          Right foot: normal    Eye Exam:       Eye Exam done elsewhere          Date: 10/08/2008          Results: normal          Done by: Herbert Deaner   Impression & Recommendations:  Problem # 1:  HYPERLIPIDEMIA (ICD-272.4) Assessment New  Her updated medication list for this problem includes:    Crestor 10 Mg Tabs (Rosuvastatin calcium) ..... One by mouth once daily for cholesterol  Labs Reviewed: SGOT: 31 (08/20/2008)   SGPT: 24 (08/20/2008)  Problem # 2:  RENAL INSUFFICIENCY (ICD-588.9) Assessment: New I will have to stop metfromin if her serum creatitine is >1.4 Orders: TLB-Lipid Panel (80061-LIPID) Venipuncture [id number]) TLB-BMP (Basic Metabolic Panel-BMET) (99991111) TLB-Hepatic/Liver Function Pnl (80076-HEPATIC) TLB-A1C / Hgb A1C (Glycohemoglobin) (83036-A1C) TLB-Udip w/ Micro (81001-URINE)  Problem # 3:  GERD (ICD-530.81) Assessment: Improved  Her updated medication list for this problem includes:    Pepcid 40 Mg Tabs (Famotidine) .Marland Kitchen... Take 1 tablet by mouth two times a day  EGD: Location: [facility]   (03/21/2007)  Labs Reviewed: Hgb: 11.3 (08/20/2008)   Hct: 33.6 (08/20/2008)  Problem # 4:  HYPERTENSION (ICD-401.9) Assessment: Unchanged  Her updated medication list for this problem includes:    Benicar 40 Mg Tabs (Olmesartan medoxomil) .Marland Kitchen... Take 1 tablet by mouth once a day    Coreg Cr 40 Mg Xr24h-cap (Carvedilol phosphate) .Marland Kitchen... Take 1 capsule by mouth once a day    Cardizem Cd 300 Mg  Xr24h-cap (Diltiazem hcl coated beads) .Marland Kitchen... Take 1 capsule by mouth once a day    Furosemide 20 Mg Tabs (Furosemide) .Marland Kitchen... Take 1 tablet by mouth bid  Orders: TLB-Lipid Panel (80061-LIPID) Venipuncture [id number]) TLB-BMP (Basic Metabolic Panel-BMET) (99991111) TLB-Hepatic/Liver Function Pnl (80076-HEPATIC) TLB-A1C / Hgb A1C (Glycohemoglobin) (83036-A1C) TLB-Udip w/ Micro (81001-URINE)  BP today: 140/72 Prior BP: 122/78 (04/03/2009)  Labs Reviewed: K+: 4.6 (08/20/2008) Creat: : 1.6 (08/20/2008)     Problem # 5:  DIABETES MELLITUS, TYPE II (ICD-250.00) Assessment: Improved  The following medications were removed from the medication list:    Januvia 100 Mg Tabs (Sitagliptin phosphate) .Marland Kitchen... Take 1 by mouth once daily    Glucophage 500 Mg Tabs (Metformin hcl) .Marland Kitchen... Take 2 by mouth two times a day qd Her updated medication list for this problem includes:    Benicar  40 Mg Tabs (Olmesartan medoxomil) .Marland Kitchen... Take 1 tablet by mouth once a day    Janumet 50-500 Mg Tabs (Sitagliptin-metformin hcl) ..... One by mouth two times a day for diabetes  Orders: Ophthalmology Referral (Ophthalmology) TLB-Lipid Panel (80061-LIPID) Venipuncture [id number]) TLB-BMP (Basic Metabolic Panel-BMET) (99991111) TLB-Hepatic/Liver Function Pnl (80076-HEPATIC) TLB-A1C / Hgb A1C (Glycohemoglobin) (83036-A1C) TLB-Udip w/ Micro (81001-URINE)  Labs Reviewed: Creat: 1.6 (08/20/2008)     Last Eye Exam: normal (10/08/2008) Reviewed HgBA1c results: 7.0 (06/26/2007)  Problem # 6:  DEPRESSIVE DISORDER (ICD-311) Assessment: Improved  Her updated medication list for this problem includes:    Effexor 75 Mg Tabs (Venlafaxine hcl) .Marland Kitchen... Take 1 by mouth once daily    Alprazolam 0.5 Mg Tabs (Alprazolam) .Marland Kitchen... Take 1 tablet by mouth three times a day as needed  Discussed treatment options, including trial of antidpressant medication. Will refer to behavioral health. Follow-up call in in 24-48 hours and recheck in  2 weeks, sooner as needed. Patient agrees to call if any worsening of symptoms or thoughts of doing harm arise. Verified that the patient has no suicidal ideation at this time.   Complete Medication List: 1)  Plavix 75 Mg Tabs (Clopidogrel bisulfate) .... Take 1 by mouth once daily 2)  Benicar 40 Mg Tabs (Olmesartan medoxomil) .... Take 1 tablet by mouth once a day 3)  Effexor 75 Mg Tabs (Venlafaxine hcl) .... Take 1 by mouth once daily 4)  Coreg Cr 40 Mg Xr24h-cap (Carvedilol phosphate) .... Take 1 capsule by mouth once a day 5)  Ambien 5 Mg Tabs (Zolpidem tartrate) .... Take one at bedtime as needed 6)  Onetouch Ultra Test Strp (Glucose blood) .... Use as directed to check blood sugar level 7)  Singulair 10 Mg Tabs (Montelukast sodium) .Marland Kitchen.. 1 daily 8)  Pepcid 40 Mg Tabs (Famotidine) .... Take 1 tablet by mouth two times a day 9)  Proventil Hfa 108 (90 Base) Mcg/act Aers (Albuterol sulfate) .... Inhale 2 puffs every four hours as needed 10)  Qvar 80 Mcg/act Aers (Beclomethasone dipropionate) .... 2 puffs two times a day  brush, gargle and rinse after use 11)  Alprazolam 0.5 Mg Tabs (Alprazolam) .... Take 1 tablet by mouth three times a day as needed 12)  Cardizem Cd 300 Mg Xr24h-cap (Diltiazem hcl coated beads) .... Take 1 capsule by mouth once a day 13)  Furosemide 20 Mg Tabs (Furosemide) .... Take 1 tablet by mouth bid 14)  Allopurinol 100 Mg Tabs (Allopurinol) .... One by mouth once daily 15)  Tums  16)  Janumet 50-500 Mg Tabs (Sitagliptin-metformin hcl) .... One by mouth two times a day for diabetes 17)  Crestor 10 Mg Tabs (Rosuvastatin calcium) .... One by mouth once daily for cholesterol  Lipid Assessment/Plan:      Based on NCEP/ATP III, the patient's risk factor category is "history of coronary disease, peripheral vascular disease, cerebrovascular disease, or aortic aneurysm along with either diabetes, current smoker, or LDL > 130 plus HDL < 40 plus triglycerides > 200".  The patient's  lipid goals are as follows: Total cholesterol goal is 200; LDL cholesterol goal is 70; HDL cholesterol goal is 40; Triglyceride goal is 150.    Colorectal Screening:  Colonoscopy Results:    Date of Exam: 06/03/2009    Results: Normal  PAP Screening:    Hx Cervical Dysplasia in last 5 yrs? No    3 normal PAP smears in last 5 yrs? Yes    Last PAP smear:  07/31/2007  Reviewed PAP smear recommendations:  patient refuses understanding risks of delayed diagnosis  PAP Smear Results:    Date of Exam:  07/31/2007    Results:  Normal  Mammogram Screening:    Last Mammogram:  03/18/2009    Reviewed Mammogram recommendations:  mammogram not due yet  Osteoporosis Risk Assessment:  Risk Factors for Fracture or Low Bone Density:   Smoking status:       quit  Immunization & Chemoprophylaxis:    Tetanus vaccine: Historical  (01/19/2004)    Influenza vaccine: given  (09/18/2009)    Patient Instructions: 1)  Please schedule a follow-up appointment in 2 months. 2)  It is important that you exercise regularly at least 20 minutes 5 times a week. If you develop chest pain, have severe difficulty breathing, or feel very tired , stop exercising immediately and seek medical attention. 3)  You need to lose weight. Consider a lower calorie diet and regular exercise.  4)  Take an Aspirin every day. 5)  Check your blood sugars regularly. If your readings are usually above 200 or below 70 you should contact our office. 6)  It is important that your Diabetic A1c level is checked every 3 months. 7)  See your eye doctor yearly to check for diabetic eye damage. 8)  Check your feet each night for sore areas, calluses or signs of infection. 9)  Check your Blood Pressure regularly. If it is above 130/80: you should make an appointment. Prescriptions: ALPRAZOLAM 0.5 MG TABS (ALPRAZOLAM) Take 1 tablet by mouth three times a day as needed  #100 x 2   Entered and Authorized by:   Janith Lima MD   Signed  by:   Janith Lima MD on 10/30/2009   Method used:   Printed then faxed to ...       [facility] (mail-order)       [place]  [address]       Ph: [phone]       Fax: [phone]   RxID:   [id number] AMBIEN 5 MG  TABS (ZOLPIDEM TARTRATE) TAKE one at bedtime as needed  #90 x 1   Entered and Authorized by:   Janith Lima MD   Signed by:   Janith Lima MD on 10/30/2009   Method used:   Printed then faxed to ...       [facility] (mail-order)       [place]  [address]       Ph: [phone]       Fax: [phone]   RxID:   [id number] CRESTOR 10 MG TABS (ROSUVASTATIN CALCIUM) One by mouth once daily for cholesterol  #90 x 3   Entered and Authorized by:   Janith Lima MD   Signed by:   Janith Lima MD on 10/30/2009   Method used:   Electronically to        [facility] (mail-order)       [place]  [address]       Ph: [phone]       Fax: [phone]   RxID:   [id number] FUROSEMIDE 20 MG TABS (FUROSEMIDE) Take 1 tablet by mouth bid  #180 x 3   Entered and Authorized by:   Janith Lima MD  Signed by:   Janith Lima MD on 10/30/2009   Method used:   Electronically to        [facility] (mail-[place])       [place]  [address]       Ph: [phone]       Fax: [phone]   [id number] ALLOPURINOL 100 MG TABS (ALLOPURINOL) one by mouth once daily  #90 x 3   Entered and Authorized by:   Janith Lima MD   Signed by:   Janith Lima MD on 10/30/2009   Method used:   Electronically to        [facility]* (mail-order)       [place]  [address]       Ph: [phone]       Fax: [phone]   RxIDWG:[id number] CARDIZEM CD 300 MG XR24H-CAP (DILTIAZEM HCL COATED BEADS) Take 1 capsule by mouth once a day  #90 x 3    Entered and Authorized by:   Janith Lima MD   Signed by:   Janith Lima MD on 10/30/2009   Method used:   Electronically to        [facility] (mail-order)       [place]  [address]       Ph: [phone]       Fax: [phone]   [id number] QVAR 80 MCG/ACT AERS (BECLOMETHASONE DIPROPIONATE) 2 puffs two times a day  brush, gargle and rinse after use  #90 day supp x 3   Entered and Authorized by:   Janith Lima MD   Signed by:   Janith Lima MD on 10/30/2009   Method used:   Electronically to        [facility] (mail-order)       [place]  [address]       Ph: [phone]       Fax: [phone]   RxIDTE:[id number] PROVENTIL HFA 108 (90 BASE) MCG/ACT AERS (ALBUTEROL SULFATE) Inhale 2 puffs every four hours as needed  #90 day supp x 3   Entered and Authorized by:   Janith Lima MD   Signed by:   Janith Lima MD on 10/30/2009   Method used:   Electronically to        [facility]* (mail-[place])       [place]  [address]       Ph: [phone]       Fax: [phone]   RxIDKD:[id number] PEPCID 40 MG TABS (FAMOTIDINE) Take 1 tablet by mouth two times a day  #180 x 3   Entered and Authorized by:   Janith Lima MD   Signed by:   Janith Lima MD on 10/30/2009   Method used:   Electronically to        [facility]* (mail-order)       [place]  [address]       Ph: [phone]       Fax: [phone]  [id number] SINGULAIR 10 MG TABS (MONTELUKAST SODIUM) 1 daily  #90 x 3   Entered and Authorized by:   Janith Lima MD   Signed by:   Janith Lima MD on 10/30/2009   Method used:   Electronically to        [facility] (mail-order)       [place]  [address]       Ph: [phone]       Fax: [phone]   RxID:    [id number] COREG CR 40 MG XR24H-CAP (CARVEDILOL PHOSPHATE) Take 1 capsule by mouth once a day  #90 x 3   Entered and Authorized by:   Janith Lima MD   Signed by:   Janith Lima MD on 10/30/2009   Method used:   Electronically to        [facility] (mail-order)       [place]  [address]       Ph: [phone]       Fax: [phone]   [id number] EFFEXOR 75 MG  TABS (VENLAFAXINE HCL) TAKE 1 by mouth once daily  #90 x 3   Entered and Authorized by:   Janith Lima MD   Signed by:   Janith Lima MD on 10/30/2009   Method used:   Electronically to        [facility] (mail-order)       [place]  [address]       Ph: [phone]       Fax: [phone]   [id number] BENICAR 40 MG TABS (OLMESARTAN MEDOXOMIL) Take 1 tablet by mouth once a day  #90 x 3   Entered and Authorized by:   Janith Lima MD   Signed by:   Janith Lima MD on 10/30/2009   Method used:   Electronically to        [facility]* (mail-order)       [place]  [address]       Ph: [phone]       Fax: [phone]   [id number] PLAVIX 75 MG  TABS (CLOPIDOGREL BISULFATE) TAKE 1 by mouth once daily  #90 x 3   Entered and Authorized by:   Janith Lima MD   Signed by:   Janith Lima MD on 10/30/2009   Method used:   Electronically to        [facility] (mail-order)       [place]  [address]       Ph: [phone]       Fax: [phone]   [id number] CRESTOR 10 MG TABS (ROSUVASTATIN CALCIUM) One by mouth once daily for cholesterol  #116 x 0   Entered and Authorized by:   Janith Lima MD   Signed by:   Janith Lima MD on 10/30/2009   Method used:   Samples Given   RxID:   [id number] JANUMET 50-500 MG TABS (SITAGLIPTIN-METFORMIN HCL) One by mouth two times a day for  diabetes  #105 x 0   Entered and Authorized by:  Janith Lima MD   Signed by:   Janith Lima MD on 10/30/2009   Method used:   Samples Given   RxID:   [id number]   Preventive Care Screening  Last Flu Shot:    Date:  09/18/2009    Results:  given   Mammogram:    Date:  03/18/2009    Results:  normal   Last Tetanus Booster:    Date:  01/19/2004    Results:  Historical

## 2010-02-18 NOTE — Assessment & Plan Note (Signed)
Summary: Acute NP office visit - bronchitis   Primary Provider/Referring Provider:  Jerrell Belfast, MD  CC:  sore throat, sinus pressure/congestion, PND, prod cough with thick yellow mucus, and low grade temp x1day - pt going out of town next week.  would also like refills on qvar and tussionex.  History of Present Illness:   01/20/07-  Allergic rhinitis Asthma Cough Hx Urticaria Says thick stickey mucus increasing over past year. Denies seasonal worsening/ indoor heat. Tries Symbicort and as needed alburerol helps 1-2 x/ month. Very active heartburn- Dr. Delfin Edis Asks about MRI/CT seeking reassurance that pains along bilateral lower rib margins and substernal are not serious.   Not much cough as long as she runs air fliter machine in bedroom for sleep. She uses Tussionex for the substernal soreness- still has 1/2 bottle. She stays with family much of each year in [place].  03/28/08- Allergic rhinitis, asthma, cough, hx urticaria Stays with dtr part of year in [place].  Coughs at night, mucus causes stomach upset. Gets breathless, sometimes with wheeze, with exertion. Noticed Symbicort seemed to cause esophageal soreness. Better off using rescue Proventil inhaler just as needed and at night. Noticing more mucus coming up from mid chest, different from overt reflux. Thick sticky mucus may gag her and her ribs get sore when she coughs more. Was exposed to son, who has a cold, this weekend. Today she feels sore in nose- ?from him?  November 26, 2008--Present for follow up - states breathing is doing well but would like refills on her .  Only uses her symbicort  prn wants to change b/c she does like how it makes chest feel. Wants to keep a refill tussionex (states she uses 1 bottle per year.)  April 03, 2009 --Presents for an acute office visit. Complains of sore throat, sinus pressure/congestion, PND, prod cough with thick yellow mucus, low grade temp x1day - pt going out of town next  week.  would also like refills on qvar and tussionex. She is leaving to go to [place] in 1 week.  Husband has similar symptoms, he is seeing his MD tomorrow. She has been doing well until last week changed from Symbicort to QVAR. Denies chest pain, dyspnea, orthopnea, hemoptysis, fever, n/v/d, edema, headache.   Medications Prior to Update: 1)  Plavix 75 Mg  Tabs (Clopidogrel Bisulfate) .... Take 1 By Mouth Once Daily 2)  Benicar Hct 40-25 Mg  Tabs (Olmesartan Medoxomil-Hctz) .... Take 1 By Mouth Once Daily 3)  Januvia 100 Mg  Tabs (Sitagliptin Phosphate) .... Take 1 By Mouth Once Daily 4)  Glucophage 500 Mg  Tabs (Metformin Hcl) .... Take 2 By Mouth Two Times A Day Qd 5)  Effexor 75 Mg  Tabs (Venlafaxine Hcl) .... Take 1 By Mouth Once Daily 6)  Coreg Cr 40 Mg Xr24h-Cap (Carvedilol Phosphate) .... Take 1 Capsule By Mouth Once A Day 7)  Ambien 5 Mg  Tabs (Zolpidem Tartrate) .... Take 1/2 At Bedtime As Needed 8)  Onetouch Ultra Test  Strp (Glucose Blood) .... Use As Directed To Check Blood Sugar Level 9)  Singulair 10 Mg Tabs (Montelukast Sodium) .Marland Kitchen.. 1 Daily 10)  Pepcid 40 Mg Tabs (Famotidine) .... Take 1 Tablet By Mouth Two Times A Day 11)  Proventil Hfa 108 (90 Base) Mcg/act Aers (Albuterol Sulfate) .... Inhale 2 Puffs Every Four Hours As Needed 12)  Qvar 80 Mcg/act Aers (Beclomethasone Dipropionate) .... 2 Puffs Two Times A Day  Brush, Gargle and Rinse After Use  13)  Alprazolam 0.5 Mg Tabs (Alprazolam) .... Take 1 Tablet By Mouth Three Times A Day As Needed 14)  Tussionex Pennkinetic Er 8-10 Mg/32ml Lqcr (Chlorpheniramine-Hydrocodone) .... 1/2-1 Tsp Every 12 Hr As Needed Cough  Current Medications (verified): 1)  Plavix 75 Mg  Tabs (Clopidogrel Bisulfate) .... Take 1 By Mouth Once Daily 2)  Benicar Hct 40-25 Mg  Tabs (Olmesartan Medoxomil-Hctz) .... Take 1 By Mouth Once Daily 3)  Januvia 100 Mg  Tabs (Sitagliptin Phosphate) .... Take 1 By Mouth Once Daily 4)  Glucophage 500 Mg  Tabs  (Metformin Hcl) .... Take 2 By Mouth Two Times A Day Qd 5)  Effexor 75 Mg  Tabs (Venlafaxine Hcl) .... Take 1 By Mouth Once Daily 6)  Coreg Cr 40 Mg Xr24h-Cap (Carvedilol Phosphate) .... Take 1 Capsule By Mouth Once A Day 7)  Ambien 5 Mg  Tabs (Zolpidem Tartrate) .... Take 1/2 At Bedtime As Needed 8)  Onetouch Ultra Test  Strp (Glucose Blood) .... Use As Directed To Check Blood Sugar Level 9)  Singulair 10 Mg Tabs (Montelukast Sodium) .Marland Kitchen.. 1 Daily 10)  Pepcid 40 Mg Tabs (Famotidine) .... Take 1 Tablet By Mouth Two Times A Day 11)  Proventil Hfa 108 (90 Base) Mcg/act Aers (Albuterol Sulfate) .... Inhale 2 Puffs Every Four Hours As Needed 12)  Qvar 80 Mcg/act Aers (Beclomethasone Dipropionate) .... 2 Puffs Two Times A Day  Brush, Gargle and Rinse After Use 13)  Alprazolam 0.5 Mg Tabs (Alprazolam) .... Take 1 Tablet By Mouth Three Times A Day As Needed 14)  Tussionex Pennkinetic Er 8-10 Mg/20ml Lqcr (Chlorpheniramine-Hydrocodone) .... 1/2-1 Tsp Every 12 Hr As Needed Cough 15)  Zithromax Tri-Pak 500 Mg Tabs (Azithromycin) .Marland Kitchen.. 1 By Mouth Once Daily 16)  Cardizem Cd 300 Mg Xr24h-Cap (Diltiazem Hcl Coated Beads) .... Take 1 Capsule By Mouth Once A Day  Allergies (verified): 1)  ! Sulfa  Past History:  Past Medical History: Last updated: 08/20/2008 Cerebrovascular accident, hx of (2004_ Loss of smell due 2 cva (2004) elevated cholestrol NonCardiogenic 12/2002)   GASTRITIS, HX OF (123XX123)  UMBILICAL HERNIA (AB-123456789.1) Hx of ESOPHAGEAL STRICTURE (ICD-530.3) CHICKENPOX, HX OF (ICD-V15.9)   ALLERGIC RHINITIS (ICD-477.9) CEREBROVASCULAR ACCIDENT, HX OF (ICD-V12.50) HYPERTENSION (ICD-401.9) GOUT (ICD-274.9) GERD (ICD-530.81) DIVERTICULOSIS, COLON (ICD-562.10) DIABETES MELLITUS, TYPE II (ICD-250.00) COLONIC POLYPS, HYPERPLASTIC, HX OF (ICD-V12.72) ASTHMA (ICD-493.90)  Past Surgical History: Last updated: 04-27-2008 Appendectomy Hysterectomy EGD (03/16/2006) Exercise Stress test  (12/28/2002) EKG (211/28/2006) Hemmorhoidectomy C-Section x 2  Family History: Last updated: April 27, 2008 Father had GERD, died of CHF Mother cancer   ovarian Family History of Colon Cancer: Aunt Family History of Colon Polyps: Father, Aunt Family History of Diabetes:   Social History: Last updated: 08/20/2008 Patient is a former smoker. - Stopped in 2004 after CVA Alcohol Use - no Illicit Drug Use - no Occupation: retired Pharmacist, hospital Patient gets regular exercise. Daily Caffeine Use  2-3 per day  Risk Factors: Exercise: yes (04/27/08)  Risk Factors: Smoking Status: quit (04/27/08)  Review of Systems      See HPI  Vital Signs:  Patient profile:   70 year old female Height:      63 inches Weight:      189 pounds BMI:     33.60 O2 Sat:      100 % on Room air Temp:     97.1 degrees F oral Pulse rate:   62 / minute BP sitting:   122 / 78  (left arm) Cuff size:  regular  Vitals Entered By: Parke Poisson CNA (April 03, 2009 3:33 PM)  O2 Flow:  Room air CC: sore throat, sinus pressure/congestion, PND, prod cough with thick yellow mucus, low grade temp x1day - pt going out of town next week.  would also like refills on qvar and tussionex Is Patient Diabetic? Yes Comments Medications reviewed with patient Daytime contact number verified with patient. Parke Poisson CNA  April 03, 2009 3:33 PM    Physical Exam  Additional Exam:  General: A/Ox3; pleasant and cooperative, NAD, overweight, talkative SKIN: no rash, lesions NODES: no lymphadenopathy HEENT: [place]/AT, EOM- WNL, Conjuctivae- clear,   TM-WNL, Nose- pale w/ clear discharge  Throat- clear and wnl,  NECK: Supple w/ fair ROM, JVD- none, normal carotid impulses w/o bruits Thyroid- normal to palpation CHEST: Clear to P&A, not coughing or wheezing HEART: RRR, no m/g/r heard ABDOMEN: Soft  [id number], nl pulses, no edema  NEURO: Grossly intact to observation      Impression & Recommendations:  Problem # 1:   UPPER RESPIRATORY INFECTION (ICD-465.9)  Zpack take as directed.  Mucinex DM two times a day as needed cough/congestion  Increase fluids.  Saline nasal rinses as needed  May use Tussionex 1 tsp at bedtime as needed cough, may make you sleepy .  follow up Dr. Annamaria Boots in 4-6 months.  Her updated medication list for this problem includes:    Tussionex Pennkinetic Er 8-10 Mg/74ml Lqcr (Chlorpheniramine-hydrocodone) .Marland Kitchen... 1/2-1 tsp every 12 hr as needed cough  Orders: Est. Patient Level III [id number])  Medications Added to Medication List This Visit: 1)  Zithromax Tri-pak 500 Mg Tabs (Azithromycin) .Marland Kitchen.. 1 by mouth once daily 2)  Cardizem Cd 300 Mg Xr24h-cap (Diltiazem hcl coated beads) .... Take 1 capsule by mouth once a day  Complete Medication List: 1)  Plavix 75 Mg Tabs (Clopidogrel bisulfate) .... Take 1 by mouth once daily 2)  Benicar Hct 40-25 Mg Tabs (Olmesartan medoxomil-hctz) .... Take 1 by mouth once daily 3)  Januvia 100 Mg Tabs (Sitagliptin phosphate) .... Take 1 by mouth once daily 4)  Glucophage 500 Mg Tabs (Metformin hcl) .... Take 2 by mouth two times a day qd 5)  Effexor 75 Mg Tabs (Venlafaxine hcl) .... Take 1 by mouth once daily 6)  Coreg Cr 40 Mg Xr24h-cap (Carvedilol phosphate) .... Take 1 capsule by mouth once a day 7)  Ambien 5 Mg Tabs (Zolpidem tartrate) .... Take 1/2 at bedtime as needed 8)  Onetouch Ultra Test Strp (Glucose blood) .... Use as directed to check blood sugar level 9)  Singulair 10 Mg Tabs (Montelukast sodium) .Marland Kitchen.. 1 daily 10)  Pepcid 40 Mg Tabs (Famotidine) .... Take 1 tablet by mouth two times a day 11)  Proventil Hfa 108 (90 Base) Mcg/act Aers (Albuterol sulfate) .... Inhale 2 puffs every four hours as needed 12)  Qvar 80 Mcg/act Aers (Beclomethasone dipropionate) .... 2 puffs two times a day  brush, gargle and rinse after use 13)  Alprazolam 0.5 Mg Tabs (Alprazolam) .... Take 1 tablet by mouth three times a day as needed 14)  Tussionex Pennkinetic Er  8-10 Mg/58ml Lqcr (Chlorpheniramine-hydrocodone) .... 1/2-1 tsp every 12 hr as needed cough 15)  Zithromax Tri-pak 500 Mg Tabs (Azithromycin) .Marland Kitchen.. 1 by mouth once daily 16)  Cardizem Cd 300 Mg Xr24h-cap (Diltiazem hcl coated beads) .... Take 1 capsule by mouth once a day   Patient Instructions: 1)  Zpack take as directed.  2)  Mucinex DM two times a day  as needed cough/congestion  3)  Increase fluids.  4)  Saline nasal rinses as needed  5)  May use Tussionex 1 tsp at bedtime as needed cough, may make you sleepy .  6)  follow up Dr. Annamaria Boots in 4-6 months.  Prescriptions: TUSSIONEX PENNKINETIC ER 8-10 MG/5ML LQCR (CHLORPHENIRAMINE-HYDROCODONE) 1/2-1 tsp every 12 hr as needed cough  #4 oz x 0   Entered and Authorized by:   Rexene Edison NP   Signed by:   Iness Pangilinan NP on 04/03/2009   Method used:   Print then Give to Patient   RxID:   [id number] ZITHROMAX TRI-PAK 500 MG TABS (AZITHROMYCIN) 1 by mouth once daily  #1 x 0   Entered and Authorized by:   Rexene Edison NP   Signed by:   Lakrisha Iseman NP on 04/03/2009   Method used:   Electronically to        [facility] (retail)       [address]       [place], [place]  [address]       Ph: [phone] or [phone]       Fax: [phone]   RxID:   [id number]

## 2010-02-18 NOTE — Progress Notes (Signed)
     Diabetes Management Exam:    Eye Exam:       Eye Exam done elsewhere          Date: 11/18/2009          Results: normal          Done by: Herbert Deaner

## 2010-02-18 NOTE — Progress Notes (Signed)
Summary: appt  Phone Note Call from Patient Call back at Home Phone [phone]   Caller: Patient Call For: young Reason for Call: Talk to Nurse Summary of Call: sore throat, cough, no fever,  Would like to be seen today or tomorrow CDY or TP Initial call taken by: Zigmund Gottron,  April 03, 2009 12:21 PM  Follow-up for Phone Call        tp has 3:15 open Follow-up by: Maryann Conners CMA,  April 03, 2009 12:24 PM  Additional Follow-up for Phone Call Additional follow up Details #1::        pt coming in @ 3:15 to see TP Additional Follow-up by: Zigmund Gottron,  April 03, 2009 12:27 PM

## 2010-02-18 NOTE — Letter (Signed)
Summary: Results Follow-up Letter  [facility] [address] [place]   [place], [place] [address]   Phone: [phone]  Fax: [phone]    12/25/2009  [address] [place], [place]  [address]  Dear Ms. Marciel,   The following are the results of your recent test(s):  Test     Result     Kidney function   worse- please stop taking JANUMET   _________________________________________________________  Please call for an appointment in 2-3 weeks _________________________________________________________ _________________________________________________________ _________________________________________________________  Sincerely,  Scarlette Calico MD [facility]

## 2010-02-18 NOTE — Letter (Signed)
Summary: [facility]   Imported By: Rise Patience 11/20/2009 14:34:06  _____________________________________________________________________  External Attachment:    Type:   Image     Comment:   External Document

## 2010-02-18 NOTE — Assessment & Plan Note (Signed)
Summary: sob//td   Primary Provider/Referring Provider:  Janith Lima MD  CC:  Acute visit-Increased SOB and wheezing-worse at night when laying down); cough-productive in mornings-lite yellow and thick in color..  History of Present Illness: 03/28/08- Allergic rhinitis, asthma, cough, hx urticaria Stays with dtr part of year in [place].  Coughs at night, mucus causes stomach upset. Gets breathless, sometimes with wheeze, with exertion. Noticed Symbicort seemed to cause esophageal soreness. Better off using rescue Proventil inhaler just as needed and at night. Noticing more mucus coming up from mid chest, different from overt reflux. Thick sticky mucus may gag her and her ribs get sore when she coughs more. Was exposed to son, who has a cold, this weekend. Today she feels sore in nose- ?from him?  November 26, 2008--Present for follow up - states breathing is doing well but would like refills on her .  Only uses her symbicort  prn wants to change b/c she does like how it makes chest feel. Wants to keep a refill tussionex (states she uses 1 bottle per year.)  April 03, 2009 --Presents for an acute office visit. Complains of sore throat, sinus pressure/congestion, PND, prod cough with thick yellow mucus, low grade temp x1day - pt going out of town next week.  would also like refills on qvar and tussionex. She is leaving to go to [place] in 1 week.  Husband has similar symptoms, he is seeing his MD tomorrow. She has been doing well until last week changed from Symbicort to QVAR. Denies chest pain, dyspnea, orthopnea, hemoptysis, fever, n/v/d, edema, headache.   December 05, 2009- Allergic rhinitis, asthma, cough, hx urticaria Nurse-CC: Acute visit-Increased SOB and wheezing-worse at night when laying down); cough-productive in mornings-lite yellow and thick in color. Acute visit w/ more wheeze and cough x 3 weeks, eyes watering, bitemporal headache, some sorethroat, mouth breathing. Morning  cough light yellow- using Tussionex sparingly. Wheezing is very noticable at night. Uses rescue inahler recently at least every night.  Some lightheaded w/o numbness.Fell in bath hitting back of head 6 weeks ago. Saw Dr in [place] "maybe concussion".        Asthma History    Initial Asthma Severity Rating:    Age range: 12+ years    Symptoms: >2 days/week; not daily    Nighttime Awakenings: 0-2/month    Interferes w/ normal activity: no limitations    SABA use (not for EIB): daily    Asthma Severity Assessment: Moderate Persistent   Preventive Screening-Counseling & Management  Alcohol-Tobacco     Alcohol drinks/day: 0     Alcohol Counseling: not indicated; patient does not drink     Smoking Status: quit     Year Quit: 2004     Pack years: 30 years 1 to 2 packs     Tobacco Counseling: to remain off tobacco products  Current Medications (verified): 1)  Plavix 75 Mg  Tabs (Clopidogrel Bisulfate) .... Take 1 By Mouth Once Daily 2)  Benicar 40 Mg Tabs (Olmesartan Medoxomil) .... Take 1 Tablet By Mouth Once A Day 3)  Effexor 75 Mg  Tabs (Venlafaxine Hcl) .... Take 1 By Mouth Once Daily 4)  Coreg Cr 40 Mg Xr24h-Cap (Carvedilol Phosphate) .... Take 1 Capsule By Mouth Once A Day 5)  Ambien 5 Mg  Tabs (Zolpidem Tartrate) .... Take One At Bedtime As Needed 6)  Onetouch Ultra Test  Strp (Glucose Blood) .... Use As Directed To Check Blood Sugar Level 7)  Singulair 10 Mg  Tabs (Montelukast Sodium) .Marland Kitchen.. 1 Daily 8)  Pepcid 40 Mg Tabs (Famotidine) .... Take 1 Tablet By Mouth Two Times A Day 9)  Proventil Hfa 108 (90 Base) Mcg/act Aers (Albuterol Sulfate) .... Inhale 2 Puffs Every Four Hours As Needed 10)  Qvar 80 Mcg/act Aers (Beclomethasone Dipropionate) .... 2 Puffs Two Times A Day  Brush, Gargle and Rinse After Use 11)  Alprazolam 0.5 Mg Tabs (Alprazolam) .... Take 1 Tablet By Mouth Three Times A Day As Needed 12)  Cardizem Cd 300 Mg Xr24h-Cap (Diltiazem Hcl Coated Beads) .... Take 1  Capsule By Mouth Once A Day 13)  Furosemide 20 Mg Tabs (Furosemide) .... Take 1 Tablet By Mouth Bid 14)  Allopurinol 100 Mg Tabs (Allopurinol) .... One By Mouth Once Daily 15)  Tums 16)  Janumet 50-500 Mg Tabs (Sitagliptin-Metformin Hcl) .... One By Mouth Two Times A Day For Diabetes 17)  Crestor 10 Mg Tabs (Rosuvastatin Calcium) .... One By Mouth Once Daily For Cholesterol 18)  Tussionex Pennkinetic Er 10-8 Mg/48ml Lqcr (Hydrocod Polst-Chlorphen Polst) .Marland Kitchen.. 1 Teaspoonful Every 12 Hours As Needed Cough  Allergies (verified): 1)  ! 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.1) Hx of ESOPHAGEAL STRICTURE (ICD-530.3) CHICKENPOX, HX OF (ICD-V15.9) ALLERGIC RHINITIS (ICD-477.9) CEREBROVASCULAR ACCIDENT, HX OF (ICD-V12.50) HYPERTENSION (ICD-401.9) GOUT (ICD-274.9) GERD (ICD-530.81) DIVERTICULOSIS, COLON (ICD-562.10) DIABETES MELLITUS, TYPE II (ICD-250.00) COLONIC POLYPS, HYPERPLASTIC, HX OF (ICD-V12.72) ASTHMA (ICD-493.90) Renal insufficiency Hyperlipidemia  Past Surgical History: Last updated: Apr 09, 2008 Appendectomy Hysterectomy EGD (03/16/2006) Exercise Stress test (12/28/2002) EKG (211/28/2006) Hemmorhoidectomy C-Section x 2  Family History: Last updated: 04-09-2008 Father had GERD, died of CHF Mother cancer   ovarian Family History of Colon Cancer: Aunt Family History of Colon Polyps: Father, Aunt Family History of Diabetes:   Social History: Last updated: 08/20/2008 Patient is a former smoker. - Stopped in 2004 after CVA Alcohol Use - no Illicit Drug Use - no Occupation: retired Pharmacist, hospital Patient gets regular exercise. Daily Caffeine Use  2-3 per day  Risk Factors: Alcohol Use: 0 (12/05/2009) Exercise: yes (04-09-2008)  Risk Factors: Smoking Status: quit (12/05/2009)  Review of  Systems      See HPI       The patient complains of shortness of breath with activity, non-productive cough, and nasal congestion/difficulty breathing through nose.  The patient denies shortness of breath at rest, coughing up blood, chest pain, irregular heartbeats, acid heartburn, indigestion, loss of appetite, weight change, abdominal pain, difficulty swallowing, sore throat, tooth/dental problems, headaches, sneezing, itching, ear ache, rash, and fever.    Vital Signs:  Patient profile:   70 year old female Menstrual status:  hysterectomy Height:      63 inches Weight:      193.38 pounds BMI:     34.38 O2 Sat:      96 % on Room air Pulse rate:   70 / minute BP sitting:   154 / 72  (left arm) Cuff size:   regular  Vitals Entered By: Clayborne Dana CMA (December 05, 2009 11:24 AM)  O2 Flow:  Room air CC: Acute visit-Increased SOB and wheezing-worse at night when laying down); cough-productive in mornings-lite yellow and thick in color.   Physical Exam  Additional Exam:  General: A/Ox3; pleasant and cooperative, NAD, overweight, talkative SKIN: no rash, lesions NODES: no lymphadenopathy HEENT: [place]/AT, EOM- WNL, Conjuctivae- clear,   TM-hearing  aid on right, Nose- mucus bridging  Throat- clear and wnl, Mallampati  II NECK: Supple w/ fair ROM, JVD- none, normal carotid impulses w/o bruits Thyroid- normal to palpation CHEST: Clear to P&A, mild bilateral upper zone wheeze on exhalation HEART: RRR, no m/g/r heard ABDOMEN: Soft  [id number], nl pulses, no edema  NEURO: Grossly intact to observation      Impression & Recommendations:  Problem # 1:  ALLERGIC RHINITIS (ICD-477.9)  This is more likely viral initially than allergic, with a rhinosinusitis/ bronchitis pattern. She watches her blood sugar and isn't due for formal glucose check soon, xo we decided to give a depo shot to treat both nose and chest. Will give neb and Z pak.  Problem # 2:  ASTHMA (ICD-493.90) Asthmatic  bronchitis, part of the rhinitis/ bronchitis pattern I think likely is infectious this time. Depomedrol, fluids and her usual meds.   Medications Added to Medication List This Visit: 1)  Tussionex Pennkinetic Er 10-8 Mg/64ml Lqcr (Hydrocod polst-chlorphen polst) .Marland Kitchen.. 1 teaspoonful every 12 hours as needed cough 2)  Zithromax Z-pak 250 Mg Tabs (Azithromycin) .... 2 today then one daily  Other Orders: Est. Patient Level III [id number]) Admin of Therapeutic Inj  intramuscular or subcutaneous [id number]) Depo- Medrol 80mg  (J1040) Nebulizer Tx [id number])  Patient Instructions: 1)  Please schedule a follow-up appointment in 1 year.- Call sooner if needed. 2)  neb neo nasal  3)  depo 80 4)  script for Z pak sent to [facility] Prescriptions: SINGULAIR 10 MG TABS (MONTELUKAST SODIUM) 1 daily  #30 x 11   Entered by:   Clayborne Dana CMA   Authorized by:   Deneise Lever MD   Signed by:   Clayborne Dana CMA on 12/05/2009   Method used:   Electronically to        [facility] [place] (retail)       [place], [place]  [address]       Ph: [phone] or [phone]       Fax: [phone]   RxID:   [id number] ZITHROMAX Z-PAK 250 MG TABS (AZITHROMYCIN) 2 today then one daily  #1 pak x 0   Entered and Authorized by:   Deneise Lever MD   Signed by:   Deneise Lever MD on 12/05/2009   Method used:   [facility]        [facility] [place] (retail)       [place], [place]  [address]       Ph: [phone] or [phone]       Fax: [phone]   RxID:   [id number]      Medication Administration  Injection # 1:    Medication: Depo- Medrol 80mg     Diagnosis: ALLERGIC RHINITIS (ICD-477.9)    Route: SQ    Site: RUOQ gluteus    Exp Date: 05/2012    Lot #: [id number]    Mfr: Pharmacia    Patient tolerated injection without complications    Given by: Clayborne Dana CMA (December 05, 2009 12:25 PM)  Medication # 1:    Medication:  EMR miscellaneous medications    Diagnosis: ALLERGIC RHINITIS (ICD-477.9)    Dose: 3 drops    Route: intranasal    Exp Date: 04/2011    Lot #: [id number]    Mfr: Bayer    Comments: Neo-Synephrine    Patient tolerated medication without  complications    Given by: Clayborne Dana CMA (December 05, 2009 12:26 PM)  Orders Added: 1)  Est. Patient Level III [id number] 2)  Admin of Therapeutic Inj  intramuscular or subcutaneous [[id number]] 3)  Depo- Medrol 80mg  [J1040] 4)  Nebulizer Tx [id number]

## 2010-02-18 NOTE — Progress Notes (Signed)
Summary: SOB   Phone Note Call from Patient   Caller: Patient Call For: young Summary of Call: Pt c/o sob x3 wks, wants to be seen tomorrow, pls advise.//[facility] Initial call taken by: Netta Neat,  December 02, 2009 3:07 PM  Follow-up for Phone Call        Pt does need ov!- Last seen March 2010- No openings tommorrow with CDY.  LMOMTCB [place]  December 02, 2009 3:57 PM   Additional Follow-up for Phone Call Additional follow up Details #1::        Pt was last seen by CY on 03/2008 and by TP on 03/2009.  Called, spoke with pt.  She states she is "struggling to breath at night,",: having soreness in chest,and cough x 3 wks.  States she started taking tussionex which has helped with the cough -- now only having an occas cough.  Also c/o sore throat x 1 wk.  Pls advise.  Can pt be worked in tomorrow?  Thanks! Additional Follow-up by: Raymondo Band RN,  December 03, 2009 12:20 PM    Additional Follow-up for Phone Call Additional follow up Details #2::    Ok to work in if we can. Follow-up by: Deneise Lever MD,  December 03, 2009 2:37 PM  Additional Follow-up for Phone Call Additional follow up Details #3:: Details for Additional Follow-up Action Taken: LMTCB-see if we can get her to come in Thursday 130pm.[facility] CMA  December 03, 2009 4:34 PM  got in touch with pt she was confused about the msg so did not make it at 1:30 today so we gave appt for 11:30 tomorrow--pt is aware of appt  Additional Follow-up by: Amber,  December 04, 2009 2:18 PM

## 2010-02-19 NOTE — Assessment & Plan Note (Signed)
Summary: issue w/blood sugar, req ov today/cd   Vital Signs:  Patient profile:   70 year old female Menstrual status:  hysterectomy Height:      63 inches Weight:      190.0 pounds BMI:     33.78 O2 Sat:      98 % on Room air Temp:     97.7 degrees F oral Pulse rate:   77 / minute Pulse rhythm:   regular Resp:     16 per minute BP sitting:   178 / 72  (left arm) Cuff size:   regular  Vitals Entered By: Crissie Sickles, CMA (December 29, 2009 3:44 PM)  Nutrition Counseling: Patient's BMI is greater than 25 and therefore counseled on weight management options.  O2 Flow:  Room air CC: Elevated blood sugars Is Patient Diabetic? Yes Did you bring your meter with you today? Yes Comments 229 at 3:25p today   Primary Care Provider:  Janith Lima MD  CC:  Elevated blood sugars.  History of Present Illness:  Follow-Up Visit      This is a 70 year old woman who presents for Follow-up visit.  The patient complains of high blood sugar symptoms, but denies chest pain, palpitations, dizziness, syncope, low blood sugar symptoms, edema, SOB, DOE, PND, and orthopnea.  Since the last visit the patient notes no new problems or concerns.  The patient reports taking meds as prescribed, monitoring BP, monitoring blood sugars, and dietary noncompliance.  When questioned about possible medication side effects, the patient notes none.    Preventive Screening-Counseling & Management  Alcohol-Tobacco     Alcohol drinks/day: 0     Alcohol Counseling: not indicated; patient does not drink     Smoking Status: quit     Year Quit: 2004     Pack years: 30 years 1 to 2 packs     Tobacco Counseling: to remain off tobacco products  Clinical Review Panels:  Prevention   Last Mammogram:  normal (03/18/2009)   Last Pap Smear:  Normal (07/31/2007)   Last Colonoscopy:  Normal (06/03/2009)  Immunizations   Last Tetanus Booster:  Historical (01/19/2004)   Last Flu Vaccine:  given  (09/18/2009)  Lipid Management   Cholesterol:  230 (10/30/2009)   HDL (good cholesterol):  62.50 (10/30/2009)  Diabetes Management   HgBA1C:  6.5 (10/30/2009)   Creatinine:  1.7 (12/24/2009)   Last Dilated Eye Exam:  normal (11/18/2009)   Last Foot Exam:  yes (12/29/2009)   Last Flu Vaccine:  given (09/18/2009)  CBC   WBC:  8.0 (08/20/2008)   RBC:  3.87 (08/20/2008)   Hgb:  12.0 (12/24/2009)   Hct:  33.6 (08/20/2008)   Platelets:  240.0 (08/20/2008)   MCV  86.8 (08/20/2008)   MCHC  33.6 (08/20/2008)   RDW  14.3 (08/20/2008)   PMN:  64.3 (08/20/2008)   Lymphs:  28.0 (08/20/2008)   Monos:  6.2 (08/20/2008)   Eosinophils:  0.9 (08/20/2008)   Basophil:  0.6 (08/20/2008)  Complete Metabolic Panel   Glucose:  102 (12/24/2009)   Sodium:  138 (12/24/2009)   Potassium:  4.8 (12/24/2009)   Chloride:  102 (12/24/2009)   CO2:  26 (12/24/2009)   BUN:  25 (12/24/2009)   Creatinine:  1.7 (12/24/2009)   Albumin:  3.4 (10/30/2009)   Total Protein:  6.6 (10/30/2009)   Calcium:  10.2 (12/24/2009)   Total Bili:  0.3 (10/30/2009)   Alk Phos:  79 (10/30/2009)   SGPT (ALT):  16 (  10/30/2009)   SGOT (AST):  16 (10/30/2009)   Medications Prior to Update: 1)  Plavix 75 Mg  Tabs (Clopidogrel Bisulfate) .... Take 1 By Mouth Once Daily 2)  Benicar 40 Mg Tabs (Olmesartan Medoxomil) .... Take 1 Tablet By Mouth Once A Day 3)  Effexor 75 Mg  Tabs (Venlafaxine Hcl) .... Take 1 By Mouth Once Daily 4)  Coreg Cr 40 Mg Xr24h-Cap (Carvedilol Phosphate) .... Take 1 Capsule By Mouth Once A Day 5)  Ambien 5 Mg  Tabs (Zolpidem Tartrate) .... Take One At Bedtime As Needed 6)  Onetouch Ultra Test  Strp (Glucose Blood) .... Use As Directed To Check Blood Sugar Level 7)  Singulair 10 Mg Tabs (Montelukast Sodium) .Marland Kitchen.. 1 Daily 8)  Pepcid 40 Mg Tabs (Famotidine) .... Take 1 Tablet By Mouth Two Times A Day 9)  Proventil Hfa 108 (90 Base) Mcg/act Aers (Albuterol Sulfate) .... Inhale 2 Puffs Every Four Hours As  Needed 10)  Qvar 80 Mcg/act Aers (Beclomethasone Dipropionate) .... 2 Puffs Two Times A Day  Brush, Gargle and Rinse After Use 11)  Cardizem Cd 300 Mg Xr24h-Cap (Diltiazem Hcl Coated Beads) .... Take 1 Capsule By Mouth Once A Day 12)  Furosemide 20 Mg Tabs (Furosemide) .... Take 1 Tablet By Mouth Bid 13)  Allopurinol 100 Mg Tabs (Allopurinol) .... One By Mouth Once Daily As Needed 14)  Tums 15)  Janumet 50-500 Mg Tabs (Sitagliptin-Metformin Hcl) .... One By Mouth Two Times A Day For Diabetes 16)  Crestor 10 Mg Tabs (Rosuvastatin Calcium) .... One By Mouth Once Daily For Cholesterol 17)  Glimepiride 1 Mg Tabs (Glimepiride) .... One By Mouth Once Daily For Diabetes  Current Medications (verified): 1)  Plavix 75 Mg  Tabs (Clopidogrel Bisulfate) .... Take 1 By Mouth Once Daily 2)  Effexor 75 Mg  Tabs (Venlafaxine Hcl) .... Take 1 By Mouth Once Daily 3)  Coreg Cr 40 Mg Xr24h-Cap (Carvedilol Phosphate) .... Take 1 Capsule By Mouth Once A Day 4)  Ambien 5 Mg  Tabs (Zolpidem Tartrate) .... Take One At Bedtime As Needed 5)  Onetouch Ultra Test  Strp (Glucose Blood) .... Use As Directed To Check Blood Sugar Level 6)  Singulair 10 Mg Tabs (Montelukast Sodium) .Marland Kitchen.. 1 Daily 7)  Pepcid 40 Mg Tabs (Famotidine) .... Take 1 Tablet By Mouth Two Times A Day 8)  Proventil Hfa 108 (90 Base) Mcg/act Aers (Albuterol Sulfate) .... Inhale 2 Puffs Every Four Hours As Needed 9)  Qvar 80 Mcg/act Aers (Beclomethasone Dipropionate) .... 2 Puffs Two Times A Day  Brush, Gargle and Rinse After Use 10)  Cardizem Cd 300 Mg Xr24h-Cap (Diltiazem Hcl Coated Beads) .... Take 1 Capsule By Mouth Once A Day 11)  Allopurinol 100 Mg Tabs (Allopurinol) .... One By Mouth Once Daily As Needed 12)  Tums 13)  Crestor 10 Mg Tabs (Rosuvastatin Calcium) .... One By Mouth Once Daily For Cholesterol 14)  Glimepiride 2 Mg Tabs (Glimepiride) .... One By Mouth Once Daily 15)  Januvia 50 Mg Tabs (Sitagliptin Phosphate) .... One By Mouth Once  Daily 16)  Benicar Hct 40-25 Mg Tabs (Olmesartan Medoxomil-Hctz) .... One By Mouth Once Daily For High Blood Pressure  Allergies (verified): 1)  ! Sulfa 2)  ! 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) Hx of ESOPHAGEAL STRICTURE (ICD-530.3) CHICKENPOX, HX OF (ICD-V15.9) ALLERGIC RHINITIS (  ICD-477.9) CEREBROVASCULAR ACCIDENT, HX OF (ICD-V12.50) HYPERTENSION (ICD-401.9) GOUT (ICD-274.9) GERD (ICD-530.81) DIVERTICULOSIS, COLON (ICD-562.10) DIABETES MELLITUS, TYPE II (ICD-250.00) COLONIC POLYPS, HYPERPLASTIC, HX OF (ICD-V12.72) ASTHMA (ICD-493.90) Renal insufficiency Hyperlipidemia  Past Surgical History: Last updated: 2008-04-10 Appendectomy Hysterectomy EGD (03/16/2006) Exercise Stress test (12/28/2002) EKG (211/28/2006) Hemmorhoidectomy C-Section x 2  Family History: Last updated: 04/10/2008 Father had GERD, died of CHF Mother cancer   ovarian Family History of Colon Cancer: Aunt Family History of Colon Polyps: Father, Aunt Family History of Diabetes:   Social History: Last updated: 08/20/2008 Patient is a former smoker. - Stopped in 2004 after CVA Alcohol Use - no Illicit Drug Use - no Occupation: retired Pharmacist, hospital Patient gets regular exercise. Daily Caffeine Use  2-3 per day  Risk Factors: Alcohol Use: 0 (12/29/2009) Exercise: yes (04/10/08)  Risk Factors: Smoking Status: quit (12/29/2009)  Family History: Reviewed history from 2008-04-10 and no changes required. Father had GERD, died of CHF Mother cancer   ovarian Family History of Colon Cancer: Aunt Family History of Colon Polyps: Father, Aunt Family History of Diabetes:   Social History: Reviewed history from 08/20/2008 and no changes required. Patient is a former smoker. - Stopped in 2004 after CVA Alcohol Use  - no Illicit Drug Use - no Occupation: retired Pharmacist, hospital Patient gets regular exercise. Daily Caffeine Use  2-3 per day  Review of Systems       The patient complains of weight gain.  The patient denies anorexia, fever, weight loss, chest pain, syncope, dyspnea on exertion, peripheral edema, prolonged cough, headaches, hemoptysis, abdominal pain, hematuria, suspicious skin lesions, difficulty walking, and depression.   GU:  Denies decreased libido, discharge, dysuria, hematuria, nocturia, urinary frequency, and urinary hesitancy. Endo:  Denies cold intolerance, excessive hunger, excessive thirst, excessive urination, heat intolerance, polyuria, and weight change.  Physical Exam  General:  alert, well-developed, well-nourished, well-hydrated, appropriate dress, normal appearance, healthy-appearing, cooperative to examination, and overweight-appearing.   Head:  normocephalic and atraumatic.   Mouth:  Oral mucosa and oropharynx without lesions or exudates.  Teeth in good repair. Neck:  supple, full ROM, no masses, no thyromegaly, no JVD, normal carotid upstroke, no carotid bruits, no cervical lymphadenopathy, and no neck tenderness.   Lungs:  normal respiratory effort, no intercostal retractions, no accessory muscle use, normal breath sounds, no dullness, no fremitus, no crackles, and no wheezes.   Heart:  normal rate, regular rhythm, no murmur, no gallop, no rub, and no JVD.   Abdomen:  soft, non-tender, normal bowel sounds, no distention, no masses, no guarding, no rigidity, no rebound tenderness, no abdominal hernia, no inguinal hernia, no hepatomegaly, no splenomegaly, and abdominal scar(s).  No CVAT. Msk:  No deformity or scoliosis noted of thoracic or lumbar spine.   Pulses:  R and L carotid,radial,femoral,dorsalis pedis and posterior tibial pulses are full and equal bilaterally Extremities:  1+ left pedal edema and 1+ right pedal edema.   Neurologic:  No cranial nerve deficits noted.  Station and gait are normal. Plantar reflexes are down-going bilaterally. DTRs are symmetrical throughout. Sensory, motor and coordinative functions appear intact. Skin:  turgor normal, color normal, no rashes, no suspicious lesions, no ecchymoses, no petechiae, no purpura, no ulcerations, and no edema.   Cervical Nodes:  no anterior cervical adenopathy and no posterior cervical adenopathy.   Psych:  Cognition and judgment appear intact. Alert and cooperative with normal attention span and concentration. No apparent delusions, illusions, hallucinations  Diabetes Management Exam:    Foot Exam (with socks and/or shoes  not present):       Sensory-Pinprick/Light touch:          Left medial foot (L-4): normal          Left dorsal foot (L-5): normal          Left lateral foot (S-1): normal          Right medial foot (L-4): normal          Right dorsal foot (L-5): normal          Right lateral foot (S-1): normal       Sensory-Monofilament:          Left foot: normal          Right foot: normal       Inspection:          Left foot: normal          Right foot: normal       Nails:          Left foot: normal          Right foot: normal   Impression & Recommendations:  Problem # 1:  HYPERTENSION (ICD-401.9) Assessment Deteriorated  The following medications were removed from the medication list:    Benicar 40 Mg Tabs (Olmesartan medoxomil) .Marland Kitchen... Take 1 tablet by mouth once a day    Furosemide 20 Mg Tabs (Furosemide) .Marland Kitchen... Take 1 tablet by mouth bid Her updated medication list for this problem includes:    Coreg Cr 40 Mg Xr24h-cap (Carvedilol phosphate) .Marland Kitchen... Take 1 capsule by mouth once a day    Cardizem Cd 300 Mg Xr24h-cap (Diltiazem hcl coated beads) .Marland Kitchen... Take 1 capsule by mouth once a day    Benicar Hct 40-25 Mg Tabs (Olmesartan medoxomil-hctz) ..... One by mouth once daily for high blood pressure  BP today: 178/72 Prior BP: 140/64 (12/24/2009)  Prior 10 Yr Risk Heart Disease: Not  enough information (10/30/2009)  Labs Reviewed: K+: 4.8 (12/24/2009) Creat: : 1.7 (12/24/2009)   Chol: 230 (10/30/2009)   HDL: 62.50 (10/30/2009)   TG: 354.0 (10/30/2009)  Problem # 2:  RENAL INSUFFICIENCY (ICD-588.9) Assessment: Deteriorated  Problem # 3:  DIABETES MELLITUS, TYPE II (ICD-250.00) Assessment: Deteriorated  The following medications were removed from the medication list:    Benicar 40 Mg Tabs (Olmesartan medoxomil) .Marland Kitchen... Take 1 tablet by mouth once a day    Janumet 50-500 Mg Tabs (Sitagliptin-metformin hcl) ..... One by mouth two times a day for diabetes    Glimepiride 1 Mg Tabs (Glimepiride) ..... One by mouth once daily for diabetes Her updated medication list for this problem includes:    Glimepiride 2 Mg Tabs (Glimepiride) ..... One by mouth once daily    Januvia 50 Mg Tabs (Sitagliptin phosphate) ..... One by mouth once daily    Benicar Hct 40-25 Mg Tabs (Olmesartan medoxomil-hctz) ..... One by mouth once daily for high blood pressure  Labs Reviewed: Creat: 1.7 (12/24/2009)     Last Eye Exam: normal (11/18/2009) Reviewed HgBA1c results: 6.5 (10/30/2009)  7.0 (06/26/2007)  Problem # 4:  UTI (ICD-599.0) Assessment: Improved  Complete Medication List: 1)  Plavix 75 Mg Tabs (Clopidogrel bisulfate) .... Take 1 by mouth once daily 2)  Effexor 75 Mg Tabs (Venlafaxine hcl) .... Take 1 by mouth once daily 3)  Coreg Cr 40 Mg Xr24h-cap (Carvedilol phosphate) .... Take 1 capsule by mouth once a day 4)  Ambien 5 Mg Tabs (Zolpidem tartrate) .... Take one at bedtime as needed 5)  Onetouch Ultra Test Strp (Glucose blood) .... Use as directed to check blood sugar level 6)  Singulair 10 Mg Tabs (Montelukast sodium) .Marland Kitchen.. 1 daily 7)  Pepcid 40 Mg Tabs (Famotidine) .... Take 1 tablet by mouth two times a day 8)  Proventil Hfa 108 (90 Base) Mcg/act Aers (Albuterol sulfate) .... Inhale 2 puffs every four hours as needed 9)  Qvar 80 Mcg/act Aers (Beclomethasone dipropionate) ....  2 puffs two times a day  brush, gargle and rinse after use 10)  Cardizem Cd 300 Mg Xr24h-cap (Diltiazem hcl coated beads) .... Take 1 capsule by mouth once a day 11)  Allopurinol 100 Mg Tabs (Allopurinol) .... One by mouth once daily as needed 12)  Tums  13)  Crestor 10 Mg Tabs (Rosuvastatin calcium) .... One by mouth once daily for cholesterol 14)  Glimepiride 2 Mg Tabs (Glimepiride) .... One by mouth once daily 15)  Januvia 50 Mg Tabs (Sitagliptin phosphate) .... One by mouth once daily 16)  Benicar Hct 40-25 Mg Tabs (Olmesartan medoxomil-hctz) .... One by mouth once daily for high blood pressure  Patient Instructions: 1)  Please schedule a follow-up appointment in 2 months. 2)  It is important that you exercise regularly at least 20 minutes 5 times a week. If you develop chest pain, have severe difficulty breathing, or feel very tired , stop exercising immediately and seek medical attention. 3)  You need to lose weight. Consider a lower calorie diet and regular exercise.  4)  Check your blood sugars regularly. If your readings are usually above 200 or below 70 you should contact our office. 5)  It is important that your Diabetic A1c level is checked every 3 months. 6)  See your eye doctor yearly to check for diabetic eye damage. 7)  Check your feet each night for sore areas, calluses or signs of infection. 8)  Check your Blood Pressure regularly. If it is above 130/80: you should make an appointment. Prescriptions: BENICAR HCT 40-25 MG TABS (OLMESARTAN MEDOXOMIL-HCTZ) One by mouth once daily for high blood pressure  #35 x 0   Entered and Authorized by:   Janith Lima MD   Signed by:   Janith Lima MD on 12/29/2009   Method used:   Samples Given   RxID:   [id number] JANUVIA 50 MG TABS (SITAGLIPTIN PHOSPHATE) One by mouth once daily  #90 x 3   Entered and Authorized by:   Janith Lima MD   Signed by:   Janith Lima MD on 12/29/2009   Method used:   Electronically to         [facility]  [address] (retail)       [place], [place]  [address]       Ph: [phone] or [phone]       Fax: [phone]   RxID:   [id number] GLIMEPIRIDE 2 MG TABS (GLIMEPIRIDE) One by mouth once daily  #90 x 3   Entered and Authorized by:   Janith Lima MD   Signed by:   Janith Lima MD on 12/29/2009   Method used:   Elect[facility] to        [facility] (retail)       [address]       [place], [place]  [address]       Ph: [phone] or [phone]       Fax: [phone]   RxID:  [id number] JANUVIA 50 MG TABS (SITAGLIPTIN PHOSPHATE) One by mouth once daily  #30 x 11   Entered and Authorized by:   Janith Lima MD   Signed by:   Janith Lima MD on 12/29/2009   Method used:   Print then Give to Patient   RxID:   [id number] GLIMEPIRIDE 2 MG TABS (GLIMEPIRIDE) One by mouth once daily  #30 x 11   Entered and Authorized by:   Janith Lima MD   Signed by:   Janith Lima MD on 12/29/2009   Method used:   Print then Give to Patient   RxID:   [id number]    Orders Added: 1)  Est. Patient Level IV [id number]

## 2010-02-19 NOTE — Progress Notes (Signed)
Summary: CALL   Phone Note Call from Patient Call back at Home Phone [phone]   Summary of Call: Patient is requesting a call back.  Initial call taken by: Charlsie Quest, CMA,  December 29, 2009 9:26 AM  Follow-up for Phone Call        Pt scheduled to see MD today to discuss CBG problems.  Follow-up by: Charlsie Quest, CMA,  December 29, 2009 1:32 PM

## 2010-02-19 NOTE — Progress Notes (Signed)
  Phone Note From Pharmacy   Caller: Baker Janus [phone] Summary of Call: Recevied fax from pharmacy stating that insurance requires a PA for onetouch testing strips. [employer] company per rep, rx does not need a PA. She suggested that I call pharmacist and have them to rerun it.  Called pharmacy to rerun claim and return back a a cost of $32.18/bx of 50. Phamacy will contact patient.Marland KitchenMarland KitchenEllison Hughs Archie CMA  February 12, 2010 12:13 PM

## 2010-02-19 NOTE — Assessment & Plan Note (Signed)
Summary: FU/NWS   Vital Signs:  Patient profile:   70 year old female Menstrual status:  hysterectomy Height:      63 inches Weight:      190 pounds BMI:     33.78 O2 Sat:      97 % on Room air Temp:     97.4 degrees F oral Pulse rate:   58 / minute Pulse rhythm:   regular Resp:     16 per minute BP sitting:   140 / 70  (left arm) Cuff size:   regular  Vitals Entered By: Estell Harpin CMA (January 09, 2010 11:01 AM)  Nutrition Counseling: Patient's BMI is greater than 25 and therefore counseled on weight management options.  O2 Flow:  Room air CC: follow-up visit, Hypertension Management   Primary Care Provider:  Janith Lima MD  CC:  follow-up visit and Hypertension Management.  History of Present Illness:  Follow-Up Visit      This is a 70 year old woman who presents for Follow-up visit.  The patient denies chest pain, palpitations, dizziness, syncope, low blood sugar symptoms, high blood sugar symptoms, edema, SOB, DOE, PND, and orthopnea.  Since the last visit the patient notes no new problems or concerns.  The patient reports taking meds as prescribed, monitoring BP, monitoring blood sugars, and dietary compliance.  When questioned about possible medication side effects, the patient notes none.    Hypertension History:      She denies headache, chest pain, palpitations, dyspnea with exertion, orthopnea, PND, peripheral edema, visual symptoms, neurologic problems, syncope, and side effects from treatment.  She notes no problems with any antihypertensive medication side effects.        Positive major cardiovascular risk factors include female age 58 years old or older, diabetes, hyperlipidemia, and hypertension.  Negative major cardiovascular risk factors include negative family history for ischemic heart disease and non-tobacco-user status.        Positive history for target organ damage include prior stroke (or TIA).  Further assessment for target organ damage reveals  no history of ASHD, cardiac end-organ damage (CHF/LVH), peripheral vascular disease, renal insufficiency, or hypertensive retinopathy.    Preventive Screening-Counseling & Management  Alcohol-Tobacco     Alcohol drinks/day: 0     Alcohol Counseling: not indicated; patient does not drink     Smoking Status: quit     Year Quit: 2004     Pack years: 30 years 1 to 2 packs     Tobacco Counseling: to remain off tobacco products  Hep-HIV-STD-Contraception     Hepatitis Risk: no risk noted     HIV Risk: no risk noted     STD Risk: no risk noted     Dental Visit-last 6 months yes     Dental Care Counseling: to seek dental care; no dental care within six months     SBE monthly: yes     SBE Education/Counseling: to perform regular SBE     Sun Exposure-Excessive: no      Sexual History:  currently monogamous.        Drug Use:  never.        Blood Transfusions:  no.    Clinical Review Panels:  Prevention   Last Mammogram:  normal (03/18/2009)   Last Pap Smear:  Normal (07/31/2007)   Last Colonoscopy:  Normal (06/03/2009)  Immunizations   Last Tetanus Booster:  Historical (01/19/2004)   Last Flu Vaccine:  given (09/18/2009)  Lipid Management  Cholesterol:  230 (10/30/2009)   HDL (good cholesterol):  62.50 (10/30/2009)  Diabetes Management   HgBA1C:  6.5 (10/30/2009)   Creatinine:  1.7 (12/24/2009)   Last Dilated Eye Exam:  normal (11/18/2009)   Last Foot Exam:  yes (01/09/2010)   Last Flu Vaccine:  given (09/18/2009)  CBC   WBC:  8.0 (08/20/2008)   RBC:  3.87 (08/20/2008)   Hgb:  12.0 (12/24/2009)   Hct:  33.6 (08/20/2008)   Platelets:  240.0 (08/20/2008)   MCV  86.8 (08/20/2008)   MCHC  33.6 (08/20/2008)   RDW  14.3 (08/20/2008)   PMN:  64.3 (08/20/2008)   Lymphs:  28.0 (08/20/2008)   Monos:  6.2 (08/20/2008)   Eosinophils:  0.9 (08/20/2008)   Basophil:  0.6 (08/20/2008)  Complete Metabolic Panel   Glucose:  102 (12/24/2009)   Sodium:  138 (12/24/2009)    Potassium:  4.8 (12/24/2009)   Chloride:  102 (12/24/2009)   CO2:  26 (12/24/2009)   BUN:  25 (12/24/2009)   Creatinine:  1.7 (12/24/2009)   Albumin:  3.4 (10/30/2009)   Total Protein:  6.6 (10/30/2009)   Calcium:  10.2 (12/24/2009)   Total Bili:  0.3 (10/30/2009)   Alk Phos:  79 (10/30/2009)   SGPT (ALT):  16 (10/30/2009)   SGOT (AST):  16 (10/30/2009)   Medications Prior to Update: 1)  Plavix 75 Mg  Tabs (Clopidogrel Bisulfate) .... Take 1 By Mouth Once Daily 2)  Effexor 75 Mg  Tabs (Venlafaxine Hcl) .... Take 1 By Mouth Once Daily 3)  Coreg Cr 40 Mg Xr24h-Cap (Carvedilol Phosphate) .... Take 1 Capsule By Mouth Once A Day 4)  Ambien 5 Mg  Tabs (Zolpidem Tartrate) .... Take One At Bedtime As Needed 5)  Onetouch Ultra Test  Strp (Glucose Blood) .... Use As Directed To Check Blood Sugar Level 6)  Singulair 10 Mg Tabs (Montelukast Sodium) .Marland Kitchen.. 1 Daily 7)  Pepcid 40 Mg Tabs (Famotidine) .... Take 1 Tablet By Mouth Two Times A Day 8)  Proventil Hfa 108 (90 Base) Mcg/act Aers (Albuterol Sulfate) .... Inhale 2 Puffs Every Four Hours As Needed 9)  Qvar 80 Mcg/act Aers (Beclomethasone Dipropionate) .... 2 Puffs Two Times A Day  Brush, Gargle and Rinse After Use 10)  Cardizem Cd 300 Mg Xr24h-Cap (Diltiazem Hcl Coated Beads) .... Take 1 Capsule By Mouth Once A Day 11)  Allopurinol 100 Mg Tabs (Allopurinol) .... One By Mouth Once Daily As Needed 12)  Tums 13)  Crestor 10 Mg Tabs (Rosuvastatin Calcium) .... One By Mouth Once Daily For Cholesterol 14)  Glimepiride 2 Mg Tabs (Glimepiride) .... One By Mouth Once Daily 15)  Januvia 50 Mg Tabs (Sitagliptin Phosphate) .... One By Mouth Once Daily 16)  Benicar Hct 40-25 Mg Tabs (Olmesartan Medoxomil-Hctz) .... One By Mouth Once Daily For High Blood Pressure  Current Medications (verified): 1)  Plavix 75 Mg  Tabs (Clopidogrel Bisulfate) .... Take 1 By Mouth Once Daily 2)  Effexor 75 Mg  Tabs (Venlafaxine Hcl) .... Take 1 By Mouth Once Daily 3)  Coreg  Cr 40 Mg Xr24h-Cap (Carvedilol Phosphate) .... Take 1 Capsule By Mouth Once A Day 4)  Ambien 5 Mg  Tabs (Zolpidem Tartrate) .... Take One At Bedtime As Needed 5)  Onetouch Ultra Test  Strp (Glucose Blood) .... Use As Directed To Check Blood Sugar Level 6)  Singulair 10 Mg Tabs (Montelukast Sodium) .Marland Kitchen.. 1 Daily 7)  Pepcid 40 Mg Tabs (Famotidine) .... Take 1 Tablet By Mouth Two  Times A Day 8)  Proventil Hfa 108 (90 Base) Mcg/act Aers (Albuterol Sulfate) .... Inhale 2 Puffs Every Four Hours As Needed 9)  Qvar 80 Mcg/act Aers (Beclomethasone Dipropionate) .... 2 Puffs Two Times A Day  Brush, Gargle and Rinse After Use As Needed 10)  Cardizem Cd 300 Mg Xr24h-Cap (Diltiazem Hcl Coated Beads) .... Take 1 Capsule By Mouth Once A Day 11)  Tums .... As Needed 12)  Crestor 10 Mg Tabs (Rosuvastatin Calcium) .... One By Mouth Once Daily For Cholesterol 13)  Glimepiride 2 Mg Tabs (Glimepiride) .... One By Mouth Once Daily 14)  Januvia 50 Mg Tabs (Sitagliptin Phosphate) .... One By Mouth Once Daily 15)  Benicar Hct 40-25 Mg Tabs (Olmesartan Medoxomil-Hctz) .... One By Mouth Once Daily For High Blood Pressure  Allergies (verified): 1)  ! Sulfa 2)  ! 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.1) Hx of ESOPHAGEAL STRICTURE (ICD-530.3) CHICKENPOX, HX OF (ICD-V15.9) ALLERGIC RHINITIS (ICD-477.9) CEREBROVASCULAR ACCIDENT, HX OF (ICD-V12.50) HYPERTENSION (ICD-401.9) GOUT (ICD-274.9) GERD (ICD-530.81) DIVERTICULOSIS, COLON (ICD-562.10) DIABETES MELLITUS, TYPE II (ICD-250.00) COLONIC POLYPS, HYPERPLASTIC, HX OF (ICD-V12.72) ASTHMA (ICD-493.90) Renal insufficiency Hyperlipidemia  Past Surgical History: Last updated: 04/06/08 Appendectomy Hysterectomy EGD (03/16/2006) Exercise Stress test (12/28/2002) EKG  (211/28/2006) Hemmorhoidectomy C-Section x 2  Family History: Last updated: Apr 06, 2008 Father had GERD, died of CHF Mother cancer   ovarian Family History of Colon Cancer: Aunt Family History of Colon Polyps: Father, Aunt Family History of Diabetes:   Social History: Last updated: 08/20/2008 Patient is a former smoker. - Stopped in 2004 after CVA Alcohol Use - no Illicit Drug Use - no Occupation: retired Pharmacist, hospital Patient gets regular exercise. Daily Caffeine Use  2-3 per day  Risk Factors: Alcohol Use: 0 (01/09/2010) Exercise: yes (06-Apr-2008)  Risk Factors: Smoking Status: quit (01/09/2010)  Family History: Reviewed history from 2008/04/06 and no changes required. Father had GERD, died of CHF Mother cancer   ovarian Family History of Colon Cancer: Aunt Family History of Colon Polyps: Father, Aunt Family History of Diabetes:   Social History: Reviewed history from 08/20/2008 and no changes required. Patient is a former smoker. - Stopped in 2004 after CVA Alcohol Use - no Illicit Drug Use - no Occupation: retired Pharmacist, hospital Patient gets regular exercise. Daily Caffeine Use  2-3 per day  Review of Systems  The patient denies anorexia, fever, weight loss, weight gain, chest pain, syncope, dyspnea on exertion, peripheral edema, prolonged cough, headaches, hemoptysis, abdominal pain, suspicious skin lesions, transient blindness, difficulty walking, depression, and enlarged lymph nodes.   GU:  Denies dysuria, hematuria, nocturia, urinary frequency, and urinary hesitancy. Neuro:  Denies difficulty with concentration, disturbances in coordination, falling down, headaches, poor balance, sensation of room spinning, tingling, tremors, visual disturbances, and weakness.  Physical Exam  General:  alert, well-developed, well-nourished, well-hydrated, appropriate dress, normal appearance, healthy-appearing, cooperative to examination, and overweight-appearing.   Head:   normocephalic and atraumatic.   Eyes:  vision grossly intact, pupils equal, pupils round, pupils reactive to light, and no nystagmus.   Mouth:  Oral mucosa and oropharynx without lesions or exudates.  Teeth in good repair. Neck:  supple, full ROM, no masses, no thyromegaly, no JVD, normal carotid upstroke, no carotid bruits, no cervical lymphadenopathy, and no neck tenderness.   Lungs:  normal respiratory effort, no intercostal retractions, no accessory muscle use, normal breath sounds,  no dullness, no fremitus, no crackles, and no wheezes.   Heart:  normal rate, regular rhythm, no murmur, no gallop, no rub, and no JVD.   Abdomen:  soft, non-tender, normal bowel sounds, no distention, no masses, no guarding, no rigidity, no rebound tenderness, no abdominal hernia, no inguinal hernia, no hepatomegaly, no splenomegaly, and abdominal scar(s).  No CVAT. Msk:  No deformity or scoliosis noted of thoracic or lumbar spine.   Pulses:  R and L carotid,radial,femoral,dorsalis pedis and posterior tibial pulses are full and equal bilaterally Extremities:  1+ left pedal edema and 1+ right pedal edema.   Neurologic:  No cranial nerve deficits noted. Station and gait are normal. Plantar reflexes are down-going bilaterally. DTRs are symmetrical throughout. Sensory, motor and coordinative functions appear intact. Skin:  turgor normal, color normal, no rashes, no suspicious lesions, no ecchymoses, no petechiae, no purpura, no ulcerations, and no edema.   Cervical Nodes:  no anterior cervical adenopathy and no posterior cervical adenopathy.   Psych:  Cognition and judgment appear intact. Alert and cooperative with normal attention span and concentration. No apparent delusions, illusions, hallucinations  Diabetes Management Exam:    Foot Exam (with socks and/or shoes not present):       Sensory-Pinprick/Light touch:          Left medial foot (L-4): normal          Left dorsal foot (L-5): normal          Left lateral  foot (S-1): normal          Right medial foot (L-4): normal          Right dorsal foot (L-5): normal          Right lateral foot (S-1): normal       Sensory-Monofilament:          Left foot: normal          Right foot: normal       Inspection:          Left foot: normal          Right foot: normal       Nails:          Left foot: normal          Right foot: normal   Impression & Recommendations:  Problem # 1:  UTI (ICD-599.0) Assessment Improved  Problem # 2:  RENAL INSUFFICIENCY (ICD-588.9) Assessment: Deteriorated  Problem # 3:  HYPERTENSION (ICD-401.9) Assessment: Improved  Her updated medication list for this problem includes:    Coreg Cr 40 Mg Xr24h-cap (Carvedilol phosphate) .Marland Kitchen... Take 1 capsule by mouth once a day    Cardizem Cd 300 Mg Xr24h-cap (Diltiazem hcl coated beads) .Marland Kitchen... Take 1 capsule by mouth once a day    Benicar Hct 40-25 Mg Tabs (Olmesartan medoxomil-hctz) ..... One by mouth once daily for high blood pressure  BP today: 140/70 Prior BP: 178/72 (12/29/2009)  Prior 10 Yr Risk Heart Disease: Not enough information (10/30/2009)  Labs Reviewed: K+: 4.8 (12/24/2009) Creat: : 1.7 (12/24/2009)   Chol: 230 (10/30/2009)   HDL: 62.50 (10/30/2009)   TG: 354.0 (10/30/2009)  Problem # 4:  DIABETES MELLITUS, TYPE II (ICD-250.00) Assessment: Unchanged  Her updated medication list for this problem includes:    Glimepiride 2 Mg Tabs (Glimepiride) ..... One by mouth once daily    Januvia 50 Mg Tabs (Sitagliptin phosphate) ..... One by mouth once daily    Benicar Hct 40-25 Mg Tabs (Olmesartan medoxomil-hctz) .Marland KitchenMarland KitchenMarland KitchenMarland Kitchen  One by mouth once daily for high blood pressure  Labs Reviewed: Creat: 1.7 (12/24/2009)     Last Eye Exam: normal (11/18/2009) Reviewed HgBA1c results: 6.5 (10/30/2009)  7.0 (06/26/2007)  Problem # 5:  CEREBROVASCULAR ACCIDENT, HX OF (ICD-V12.50) Assessment: Unchanged  Complete Medication List: 1)  Plavix 75 Mg Tabs (Clopidogrel bisulfate) ....  Take 1 by mouth once daily 2)  Effexor 75 Mg Tabs (Venlafaxine hcl) .... Take 1 by mouth once daily 3)  Coreg Cr 40 Mg Xr24h-cap (Carvedilol phosphate) .... Take 1 capsule by mouth once a day 4)  Ambien 5 Mg Tabs (Zolpidem tartrate) .... Take one at bedtime as needed 5)  Onetouch Ultra Test Strp (Glucose blood) .... Use as directed to check blood sugar level 6)  Singulair 10 Mg Tabs (Montelukast sodium) .Marland Kitchen.. 1 daily 7)  Pepcid 40 Mg Tabs (Famotidine) .... Take 1 tablet by mouth two times a day 8)  Proventil Hfa 108 (90 Base) Mcg/act Aers (Albuterol sulfate) .... Inhale 2 puffs every four hours as needed 9)  Qvar 80 Mcg/act Aers (Beclomethasone dipropionate) .... 2 puffs two times a day  brush, gargle and rinse after use as needed 10)  Cardizem Cd 300 Mg Xr24h-cap (Diltiazem hcl coated beads) .... Take 1 capsule by mouth once a day 11)  Tums  .... As needed 12)  Crestor 10 Mg Tabs (Rosuvastatin calcium) .... One by mouth once daily for cholesterol 13)  Glimepiride 2 Mg Tabs (Glimepiride) .... One by mouth once daily 14)  Januvia 50 Mg Tabs (Sitagliptin phosphate) .... One by mouth once daily 15)  Benicar Hct 40-25 Mg Tabs (Olmesartan medoxomil-hctz) .... One by mouth once daily for high blood pressure  Hypertension Assessment/Plan:      The patient's hypertensive risk group is category C: Target organ damage and/or diabetes.  Today's blood pressure is 140/70.  Her blood pressure goal is < 130/80.   Patient Instructions: 1)  Please schedule a follow-up appointment in 2 months. 2)  It is important that you exercise regularly at least 20 minutes 5 times a week. If you develop chest pain, have severe difficulty breathing, or feel very tired , stop exercising immediately and seek medical attention. 3)  You need to lose weight. Consider a lower calorie diet and regular exercise.  4)  Check your blood sugars regularly. If your readings are usually above 200 or below 70 you should contact our  office. 5)  It is important that your Diabetic A1c level is checked every 3 months. 6)  See your eye doctor yearly to check for diabetic eye damage. 7)  Check your feet each night for sore areas, calluses or signs of infection. 8)  Check your Blood Pressure regularly. If it is above: you should make an appointment.   Orders Added: 1)  Est. Patient Level III [id number]

## 2010-02-19 NOTE — Progress Notes (Signed)
  Prescriptions: BENICAR HCT 40-25 MG TABS (OLMESARTAN MEDOXOMIL-HCTZ) One by mouth once daily for high blood pressure  #35 x 2   Entered by:   Ami Bullins CMA   Authorized by:   Janith Lima MD   Signed by:   Charlynne Cousins CMA on 01/14/2010   Method used:   Electron[facility] to        [facility]  [address] (retail[facility]       [place], [place]  [address]       Ph: [phone] or [phone]       Fax: [phone]   RxID:   [id number] BENICAR HCT 40-25 MG TABS (OLMESARTAN MEDOXOMIL-HCTZ) One by mouth once daily for high blood pressure  #35 x 2   Entered by:   Ami Bullins CMA   Authorized by:   Janith Lima MD   Signed by:   Charlynne Cousins CMA on 01/14/2010   Method used:   [facility]        [facility]  [address] (ret[facility]       [address]       [place], [place]  [address]       Ph: [phone] or [phone]       Fax: [phone]   RxID:   [id number]

## 2010-02-19 NOTE — Progress Notes (Signed)
  Phone Note Outgoing Call   Call placed to: Patient Summary of Call: LA:  please ask her to stop taking Janumet, her kidneys can't handle the metformin Initial call taken by: Janith Lima MD,  December 25, 2009 7:52 AM  Follow-up for Phone Call        left detailed vm on pt's home #, also req that she call office back & confirm she recieved message Follow-up by: Charlsie Quest, CMA,  December 26, 2009 6:30 PM

## 2010-02-25 NOTE — Progress Notes (Signed)
  Phone Note Refill Request Message from:  Fax from Pharmacy on February 17, 2010 11:38 AM  Refills Requested: Medication #1:  BENICAR HCT 40-25 MG TABS One by mouth once daily for high blood pressure.   Dosage confirmed as above?Dosage Confirmed   Supply Requested: 3 months Initial call taken by: Estell Harpin CMA,  February 17, 2010 11:39 AM    Prescriptions: BENICAR HCT 40-25 MG TABS (OLMESARTAN MEDOXOMIL-HCTZ) One by mouth once daily for high blood pressure  #90 x 3   Entered by:   Estell Harpin CMA   Authorized by:   Janith Lima MD   Signed by:   Estell Harpin CMA on 02/17/2010   Method used:   [facility]        [facility]  [address] (ret[facility]       [address]       [place], [place]  [address]       Ph: [phone] or [phone]       Fax: [phone]   RxID:   [id number]

## 2010-03-03 ENCOUNTER — Telehealth (INDEPENDENT_AMBULATORY_CARE_PROVIDER_SITE_OTHER): Payer: Self-pay | Admitting: *Deleted

## 2010-03-03 ENCOUNTER — Telehealth: Payer: Self-pay | Admitting: Internal Medicine

## 2010-03-05 ENCOUNTER — Telehealth: Payer: Self-pay | Admitting: Internal Medicine

## 2010-03-11 NOTE — Progress Notes (Signed)
Summary: PT REQUEST MD SWITCH---  ---- Converted from flag ---- ---- 02/26/2010 3:29 PM, Janith Lima MD wrote: sure  ---- 02/26/2010 3:27 PM, Lucienne Capers wrote: Dr Ronnald Ramp,  Your pt Megan [name] MR# [id number] is requesting to switch to Dr Asa Lente.  Thank you both for your reply. ------------------------------

## 2010-03-11 NOTE — Progress Notes (Signed)
Summary: REFERRAL  Phone Note Call from Patient Call back at [facility] Phone [phone]   Summary of Call: PT WANTS A REFERRAL TO DR [place]. Initial call taken by: Glena Norfolk,  March 03, 2010 2:03 PM

## 2010-03-11 NOTE — Progress Notes (Signed)
Summary: PT'S SWITCH MD REQUEST DENIED---  ---- Converted from flag ---- ---- 02/27/2010 8:20 AM, Rowe Clack MD wrote: no - but thanks for asking  ---- 02/26/2010 3:27 PM, Lucienne Capers wrote: Dr Ronnald Ramp,  Your pt Megan [name] MR# [id number] is requesting to switch to Dr Asa Lente.  Thank you both for your reply. ------------------------------

## 2010-03-17 NOTE — Progress Notes (Signed)
Summary: REQ FOR REFERRAL -aware MD out of office until Monday  Phone Note Call from Patient   Summary of Call: Summary of Call: PT WANTS A REFERRAL TO DR [place]. Initial call taken by: Glena Norfolk,  March 03, 2010 2:03 PM Initial call taken by: Charlsie Quest, [place],  March 05, 2010 10:55 AM

## 2010-05-11 ENCOUNTER — Other Ambulatory Visit: Payer: Self-pay | Admitting: Internal Medicine

## 2010-06-02 NOTE — Assessment & Plan Note (Signed)
[facility]                             PULMONARY OFFICE NOTE   NAME:[name], Megan STROUTH                      MRN:          [id number]  DATE:09/15/2006                            DOB:          Jul 12, 1940    PROBLEMS:  1. Allergic rhinitis.  2. Asthma.  3. Cough.  4. Urticaria.  5. History of chickenpox.  6. Hypertension with cerebrovascular accident.   HISTORY:  She had failed allergy vaccine because she travels too much.  She had developed another rash, like measles, which developed some  time, maybe a day, after she remembers drinking a drink with red food  coloring.  Association is totally unclear and I can not tell if this is  at all similar to the rash we diagnosed as chickenpox a couple of years  ago.  She treated herself with Zyrtec.  Occasionally she will get  itching around her waist but she does not usually see an associated rash  with that.  Morning mucus in throat, sometimes described as yellow and  thick.  She sometimes wheezes a little at night.  She has taken Zeisel  with apparently some benefit but she does not seem to distinguish well  among her antihistamine choices.   MEDICATIONS:  1. Plavix 75 mg.  2. Benicar/HCT 40/25.  3. Januvia 100 mg.  4. Metformin 500 mg two twice daily.  5. Effexor 75 mg.  6. Vytorin 10/80.  7. Cartia 240/24.  8. Glipizide XL 2.5 mg.  9. Coreg 20 mg.  10.Pantoprazole 40 mg twice daily  11.Albuterol inhaler as-needed.  12.Zantac 150 mg.  13.Ambien 5 mg.  14.Flonase.  15.Benadryl or Zeisel.   DRUG INTOLERANCE:  Sulfa with rash.   OBJECTIVE:  Weight 196 pounds.  Blood pressure 138/64.  Pulse 87.  Room  air saturation 99%.   Talkative. I do not see rash now.  I do not find adenopathy.  There is  no significant nasal congestion or drainage.  Chest is clear.  Heart  sounds regular.   IMPRESSION:  Post nasal drainage by description might suggest a low  grade persistent sinusitis. We decided to  try treating that with a  course of Biaxin to see what would happen.  There might also be a mild  asthmatic bronchitis.  Rash sounds most like an episode of urticaria.   PLAN:  1. Biaxin 500 mg twice daily for 7 days.  2. Try sample of Symbicort 160/4.5 two puffs twice daily with      instructions.  3. Schedule return in four months allowing for her travel plans but      earlier as-needed.     Clinton D. Annamaria Boots, MD, [place], [place]  Electronically Signed    CDY/MedQ  DD: 09/19/2006  DT: 09/19/2006  Job #: [id number]   cc:   Janifer Adie, M.D.

## 2010-06-02 NOTE — Assessment & Plan Note (Signed)
[facility]                         GASTROENTEROLOGY OFFICE NOTE   NAME:Megan [name], Megan [name]                      MRN:          [id number]  DATE:01/26/2007                            DOB:          12-22-1940    Megan [name] is a 70 year old African American female with  gastroesophageal reflux disease which is well controlled on a b.i.d.  dose of proton pump inhibitor.  Unfortunately, her insurance company has  allowed her only to have one Protonix a day which does not seem to  control her symptoms.  She has still symptoms of reflux.  She has a history of esophageal stricture which was dilated in November  2005.  Last upper endoscopy was done in February 2008, showing small  hiatal hernia, acute gastritis, no evidence of Barrett's esophagus.  Her  CLO test was negative.  She also has a family history of colon cancer in a direct relative and 2  indirect relatives.  Her last colonoscopy was done in November 2005,  showed colon polyps.  She was due for a repeat colonoscopy in November  2008.  She has known diverticulosis of the left colon.   MEDICATIONS:  1. Plavix 75 mg p.o. daily.  2. Pantoprazole 40 mg p.o. daily.  3. Benicar HCT 40/25 one p.o. daily.  4. Januvia 100 mg p.o. daily.  5. Metformin XR 500 mg two p.o. b.i.d.  6. Effexor 75 mg p.o. daily.  7. Vytorin 10/80, one p.o. daily.  8. Cartia 240/24 one p.o. daily.  9. Glipizide XL 2.5 mg p.o. daily.  10.Coreg 20 mg p.o. daily.  11.Symbicort.  12.Tums.   PHYSICAL EXAMINATION:  VITAL SIGNS:  Blood pressure 154/66, pulse 80,  and weight 198 pounds.  GENERAL:  The patient was alert, oriented, in no distress.  The patient  was no re-examined today.   IMPRESSION:  37. A 70 year old African American female with chronic gastroesophageal      reflux disease with exacerbation and poor control on once a day      proton pump inhibitor.  The patient was well controlled on a twice      a day program.  2. Personal history of colon polyps and family history of colon cancer      in a direct relative.  The patient is due for a colonoscopy.   PLAN:  1. Recommend b.i.d. dose of pantoprazole, samples given.  2. Schedule colonoscopy while on Plavix 75 mg daily.  3. Upper endoscopy scheduled to rule out Barrett esophagus.  4. Antireflux measures.     Megan [name]. Megan Perches, MD  Electronically Signed    DMB/MedQ  DD: 01/26/2007  DT: 01/26/2007  Job #: [id number]   cc:   Megan [name], M.D.

## 2010-06-02 NOTE — Assessment & Plan Note (Signed)
[facility]                             PULMONARY OFFICE NOTE   NAME:Schlotter, LEILANNI LIGGINS                      MRN:          [id number]  DATE:06/16/2006                            DOB:          Jan 02, 1941    HISTORY OF PRESENT ILLNESS:  The patient is a 70 year old African-  American female patient of Dr. Janee Morn who has a known history of asthma  and allergic rhinitis.  She presents today for an acute office visit.  The patient complains over the last 3 days of a productive cough with  thick yellow sputum, nasal congestion.  The patient was seen at [facility] emergency room on Jun 13, 2006 and diagnosed with bronchitis.  She was started on Augmentin, and given some Tussionex for cough.  The  patient reports the symptoms are improving; however, she continues to  have significant coughing.  She denies any hemoptysis, orthopnea, PND,  or leg swelling.   PAST MEDICAL HISTORY:  Reviewed.   CURRENT MEDICATIONS:  Reviewed.   PHYSICAL EXAMINATION:  The patient is a pleasant female in no acute  distress.  She is afebrile with stable vital signs.  O2 saturation is 99% on room  air.  HEENT:  Unremarkable.  NECK:  Supple without cervical adenopathy.  No JVD.  Lung sounds reveal coarse breath sounds bilaterally.  CARDIAC:  Regular rate.  ABDOMEN:  Soft and nontender.  EXTREMITIES:  Warm without any edema.   IMPRESSION/PLAN:  1. Acute tracheobronchitis.  The patient is going to finish Augmentin      as recommended.  May use Robitussin DM as needed for cough.  The      patient may use Tussionex #4 oz 1 tsp every 12 hours as needed for      breakthrough coughing.  The patient is aware of the sedating      effect.  The patient was given a Xopenex nebulizer treatment at the      office.  The patient will return back with Dr. Annamaria Boots as scheduled      or sooner if needed.      Rexene Edison, NP  Electronically Signed      Clinton D. Annamaria Boots, MD,  Shade Flood, FACP  Electronically Signed   TP/MedQ  DD: 06/16/2006  DT: 06/16/2006  Job #: [id number]

## 2010-06-05 NOTE — Assessment & Plan Note (Signed)
[facility]                         GASTROENTEROLOGY OFFICE NOTE   NAME:Kaleta, VENDETTA CADA                      MRN:          [id number]  DATE:03/11/2006                            DOB:          November 18, 1940    Ms. Pickerill is a very nice 70 year old patient of Dr. Loanne Drilling and Dr.  Annamaria Boots with chronic severe gastroesophageal reflux.  She had an upper  endoscopy and esophageal dilation of distal esophageal stricture in  November 2005.  She was evaluated for reflux as far as in 1993.  Since  her endoscopy in 2005 the patient has been on high dose of proton pump  inhibitor Protonix 40 mg twice a day.  Most recently while she developed  an upper respiratory infection she had a lot of coughing, which led to  exacerbation of her gastroesophageal reflux and esophagitis.  The food  was coming back, there was some burning of the throat.  Dr. Annamaria Boots  suggested adding Zantac 75 mg a day which he subsequently increased to  150 mg a day with good results.  Her sister has a similar condition  causing gastroesophageal reflux for which she takes Reglan.  The patient  has been inquiring about using Reglan.  Since patient added Tums and  Zantac to her regimen her symptoms have improved significantly.   MEDICATIONS:  1. Plavix 75 mg p.o. daily.  2. Protonix 40 mg p.o. b.i.d.  3. Zantac 150 mg nightly.  4. Benicar HCT 40/25 one p.o. daily.  5. Januvia 100 mg p.o. daily.  6. Metformin XR 500 mg two tablets b.i.d.  7. Effexor 75 mg one p.o. daily.  8. Vytorin 10/80 one p.o. daily.  9. Cartia 240/24 one daily.  10.Glipizide XL 2.5 mg daily.  11.Furosemide 20 mg daily.  12.Colchicine 0.6 mg daily.   PHYSICAL EXAMINATION:  Blood pressure 134/70, pulse 84, and weight 196  pounds which represents 10 pounds weight loss since last exam.  She was  alert, oriented, in no distress.  LUNGS:  Clear to auscultation.  COR:  Normal S1, normal S2.  ABDOMEN:  Soft, nontender, with  normoactive bowel sounds.  Minimal  discomfort in the subxiphoid area in the midline.  EXTREMITIES:  No edema.   IMPRESSION:  A 70 year old female with severe gastroesophageal reflux  disease exacerbated by recent upper respiratory infection and responding  to increased dose of acid-suppressing agent and Tums.   PLAN:  1. Continue on the Protonix 40 p.o. b.i.d., Zantac, and Tums.  2. Upper endoscopy scheduled to rule out Barrett's esophagus.     Lowella Bandy. Olevia Perches, MD  Electronically Signed    DMB/MedQ  DD: 03/11/2006  DT: 03/11/2006  Job #: [id number]   cc:   Hilliard Clark A. Loanne Drilling, MD  Clinton D. Annamaria Boots, MD, FCCP, FACP

## 2010-06-05 NOTE — Assessment & Plan Note (Signed)
[facility]                             PULMONARY OFFICE NOTE   NAME:Megan [name]                      MRN:          [id number]  DATE:03/11/2006                            DOB:          23-Oct-1940    PULMONARY FOLLOWUP   PROBLEM:  1. Allergic rhinitis.  2. Asthma.  3. Cough.  4. Resolved chicken pox.  5. Esophageal reflux.  6. Hypertension with a history of cerebrovascular accident.   PRIMARY CARE:  Dr. Barney Drain.  Gastroenterology, Dr. Delfin Edis.   HISTORY:  She had a GI viral-type syndrome earlier this winter requiring  Phenergan.  She then took Zithromax.  That has largely resolved, but  during that illness, she developed a congested cough.  Stomach has  settled down, but cough has lingered, nonproductive.  She had some  Phenergan with codeine cough syrup, but it leaked in the bag during an  air flight.  She continues to travel quite a bit.  Currently, no fever,  not particularly short of breath, just aware of persistent chest  congestion.  Chest x-ray October 4 showed stable mild cardiomegaly with  mild peribronchial thickening.   MEDICATIONS:  1. Metformin 500 mg x2 b.i.d.  2. Plavix 75 mg.  3. Protonix 40 mg b.i.d.  4. Vytorin 10/80.  5. Effexor XR 75 mg.  6. Januvia 100 mg.  7. Cardizem CD 240 mg.  8. Benicar 40/25.  9. Glipizide.  10.P.r.n. use of Ambien 5 mg at bedtime.  11.Promethazine with codeine cough syrup.  12.Flonase.  13.Zantac.  14.Benadryl.   DRUG INTOLERANCES:  SULFA, HYZAAR.   OBJECTIVE:  Weight 197 pounds, BP 138/64, pulse regular 73, room air  saturation 99%.  Somewhat raspy breath sounds.  No cough.  Breathing is unlabored.  There  is no dullness.  No pleural rub.  No adenopathy.  No evidence of post-  nasal drainage.  HEART:  Sounds are regular without murmur.   IMPRESSION:  1. Asthma with recent bronchitis.  2. Allergic rhinitis, not currently bothering her.  3. Esophageal reflux, always  the possibility that some of her      respiratory problems represent aspiration events.   PLAN:  1. Xopenex neb treatment 1.25 mg today.  2. Mucinex.  3. Refilled her lost Phenergan with codeine 200 mL 5 mL q.6h p.r.n.      cough.  4. Reflux precautions.  5. Refill Advair 100/50.  6. Refill Flonase 1 spray each nostril daily.  7. Schedule return in 6 months, earlier p.r.n.     Clinton D. Annamaria Boots, MD, [facility] Flood, [place]  Electronically Signed    CDY/MedQ  DD: 03/11/2006  DT: 03/11/2006  Job #: [id number]   cc:   Janifer Adie, M.D.

## 2010-06-05 NOTE — Op Note (Signed)
Megan, [name]               ACCOUNT NO.:  [id number]   MEDICAL RECORD NO.:  [id number]          PATIENT TYPE:  AMB   LOCATION:  ENDO                         FACILITY:  [facility]   PHYSICIAN:  Lowella Bandy. Olevia Perches, MD     DATE OF BIRTH:  31-Oct-1940   DATE OF PROCEDURE:  DATE OF DISCHARGE:  04/09/2008                               OPERATIVE REPORT   PROCEDURE:  Esophageal manometry.   INDICATIONS:  This 69 year old female has failed proton pump inhibitor  therapy, experienced regurgitation of the food, coughing, and nausea on  a daily basis.  She also has been complaining of chest pain.  She has  been on Protonix 40 mg twice a day.  She is undergoing esophageal  manometry to assess the lower esophageal sphincter as well as motility  to see if she is a candidate for Nissen fundoplication.   RESULTS:  Two-channel manometry catheter passed through nares into the  stomach with proximal channel at 36 cm and distal channel at 42 cm from  the incisors.  Total length of the LES was 6 cm.  Lower esophageal  sphincter pressure was 42.3 mmHg, normal being 10 to 45 mmHg.   Lower esophageal sphincter relaxed 69% of the time to the baseline.  Residual pressure was 13 mm.   Peristaltic waves were measured in the lower, mid, and proximal  esophagus.  All amplitude as well as duration of all waves was normal.  There were no repetitive or retrograde  contractions.  Out of 11 wet  swallows, all the contractions were propulsive.  Velocity of the  contractions was normal.   Upper esophageal sphincter was normal and relaxed to the baseline with  each swallow.   IMPRESSION:  This is a normal esophageal manometry report with normal  lower esophageal sphincter and normal peristalsis.      Lowella Bandy. Olevia Perches, MD  Electronically Signed     DMB/MEDQ  D:  04/25/2008  T:  04/26/2008  Job:  [id number]

## 2010-06-05 NOTE — Assessment & Plan Note (Signed)
[facility]                               PULMONARY OFFICE NOTE   NAME:Douthitt, ELLENORE HAMMONS                      MRN:          [id number]  DATE:08/06/2005                            DOB:          04-14-1940    PROBLEM LIST:  1.  Allergic rhinitis.  2.  Asthma.  3.  Cough.  4.  Rash/probable chicken pox (resolved).  5.  Hypertension.   HISTORY:  After she was here in February with a compatible rash, varicella  IgG level was obtained on February 16, 2005, at 3.06, declining on repeat,  March 09, 2005, to 2.83.  Rash has now resolved.  Unusual presentation as  adult chicken pox was discussed with infectious disease.  No specific  therapy needed.  That issue is resolved.  She comes now complaining of a  chest cold with increased cough for the past 2 weeks.  She was given a blood  pressure medication with thiazide diuretic which flared her gout.  We  treated with colchicine, and she developed significant diarrhea which was  resolving but has made it hard for her to assess how her respiratory status  is doing.  She had some codeine cough syrup which helped the cough, and  overall she says her chest feels better.  She did quit her Advair and  Flonase, thinking that they were increasing her cough but realizes now they  probably were not.  She has continued allergy vaccine at 1:50 which she gets  here with no problems, but she is going out of town for 3 months to  [place] to be with her daughter, who was ill there.  She has changed her  primary physician to Dr. Barney Drain.   MEDICATIONS:  1.  Metformin 500 mg x2 b.i.d.  2.  Plavix 75 mg.  3.  Protonix 40 mg b.i.d.  4.  Vytorin 10/80.  5.  Effexor 75 mg.  6.  Allergy vaccine.  7.  Advair 100/50.  8.  Flonase.  9.  Januvia 100 mg.  10. Cardizem 240 mg.  11. Colchicine has been stopped.  12. Benicar 40/25.  13. Lasix 20 mg.   DRUG INTOLERANT:  1.  SULFA.  2.  HYZAAR.   OBJECTIVE:  VITAL  SIGNS:  Weight 206 pounds, BP 130/72, pulse regular 70,  room air saturation 97%.  There is minimal wheeze, unlabored breathing, no  cough.  Heart sounds are regular without murmur.  She is quite talkative and  seems in no distress.  There is no edema.   IMPRESSION:  1.  Recent acute asthmatic episode, resolving.  2.  Background of allergic asthma and allergic rhinitis.   PLAN:  1.  She is going to stop her allergy vaccine for the time she is out of      town.  Consideration can be given to restarting at low dose and building      back to maintenance as appropriate, depending on status when she      returns.  2.  Try changing Advair 100 to Symbicort 160 with sample and instructions.  Use 1 puff b.i.d.  We could increase to 2 puffs b.i.d. later p.r.n.  3.  Decrease Flonase.  4.  Nebulizer treatment today, Xopenex 1.25 mg.  5.  Schedule to return in 4 months but earlier p.r.n.                                   Clinton D. Annamaria Boots, MD, [facility], FACP   CDY/MedQ  DD:  08/07/2005  DT:  08/07/2005  Job #:  [id number]   cc:   Janifer Adie, MD

## 2010-06-05 NOTE — Assessment & Plan Note (Signed)
[facility]                               PULMONARY OFFICE NOTE   NAME:[name], [name]                      MRN:          [id number]  DATE:10/21/2005                            DOB:          October 14, 1940    PROBLEM:  1. Allergic rhinitis.  2. Asthma.  3. Cough.  4. Resolved chicken pox.  5. Esophageal reflux.  6. Hypertension with history of cerebrovascular accident.   HISTORY:  After I saw her in July, she went back to [place], where she is  staying with a family member.  She has remained off of allergy vaccine.  She  thought I would give her an allergy shot today while she was in town for 3  days, and I told her to consider allergy vaccine discontinued.  She says she  had continued to cough and wheeze off and on.  She takes a half teaspoon of  Phenergan with codeine cough syrup at bedtime to help herself rest.  Some  shortness of breath on stairs without change.  She keeps a cool bedroom and  runs a fan for air movement at night.  Some soreness deep to the mid sternum  that may be related to moving, but she says big pills tend to hang  sometimes at mid esophagus, and she has a lot of heartburn and hiccups  despite Protonix b.i.d.  She was surprised at the need to restrict her diet  when I discussed irritating foods, peppermint and caffeine.  She has had a  previous esophageal dilatation by Dr. Olevia Perches.  She has to take Tums when she  wakes nauseated at night.  She is to see Dr. Bradd Burner this afternoon, and  then return to [place] for another 6 weeks before coming back to stay for  a longer time.  We had tried switching her from Advair to Symbicort, and she  says that she did not like that as well, and wishes to go back to Advair,  stating she still has a considerable supply of 100/50.   MEDICATIONS:  1. Metformin 500 mg two b.i.d.  2. Plavix 75 mg.  3. Protonix 40 mg b.i.d.  4. Vytorin 10/80 mg.  5. Effexor XR 75 mg.  6. Advair 100/50  to be resumed.  7. Flonase 2 sprays each nostril to be resumed.  8. Januvia 100 mg.  9. Cardizem CD 240 mg.  10.Benicar 40/25 mg.  11.Promethazine with codeine cough syrup.   ALLERGIES:  Drug intolerant of SULFA and HYZAAR.   OBJECTIVE:  Weight 205 pounds, BP 168/78, pulse regular 81, room air  saturation 98%.  She is quite talkative, obese, apparently comfortable.  HEENT:  Pharynx is not inflamed, there is no postnasal drainage and no  stridor or neck vein distention.  CHEST:  Quiet and clear without cough or wheeze.  Heart sounds regular without murmur or gallop.  EXTREMITIES:  Without edema or cyanosis.   IMPRESSION:  1. Asthma.  2. Allergic rhinitis.  3. Significant gastroesophageal reflux disease.   PLAN:  1. Allergy vaccine is discontinued, as noted.  2. She is  follow up with Dr. Delfin Edis about her reflux, and pending      that will continue Protonix 40 mg b.i.d., adding Zantac OTC b.i.d.  3. Chest x-ray.  4. May resume Advair 100/50 one b.i.d.  5. Schedule return in 4 months.  6. Flu shot is discussed and given today.       Clinton D. Annamaria Boots, MD, [facility], FACP      CDY/MedQ  DD:  10/21/2005  DT:  10/24/2005  Job #:  [id number]   cc:   Janifer Adie, M.D.  Lowella Bandy. Olevia Perches, MD

## 2010-07-31 ENCOUNTER — Ambulatory Visit: Payer: Medicare Other

## 2010-07-31 ENCOUNTER — Ambulatory Visit
Admit: 2010-07-31 | Discharge: 2010-07-31 | Disposition: A | Discharge status: Home / Self Care | Payer: Self-pay | Source: Ambulatory Visit

## 2010-07-31 DIAGNOSIS — IMO0002 Reserved for concepts with insufficient information to code with codable children: Secondary | ICD-10-CM | POA: Insufficient documentation

## 2010-07-31 DIAGNOSIS — E1165 Type 2 diabetes mellitus with hyperglycemia: Secondary | ICD-10-CM | POA: Insufficient documentation

## 2010-07-31 DIAGNOSIS — G5690 Unspecified mononeuropathy of unspecified upper limb: Secondary | ICD-10-CM | POA: Insufficient documentation

## 2010-07-31 DIAGNOSIS — E1129 Type 2 diabetes mellitus with other diabetic kidney complication: Secondary | ICD-10-CM | POA: Insufficient documentation

## 2010-07-31 NOTE — Patient Instructions (Signed)
Pt instructed on how to use lantus pen, amount of units to discard on each use, where to inject, rule of 15 for low blood sugar episodes.  Pt lacks basic diabetes knowledge.

## 2010-08-21 ENCOUNTER — Telehealth: Payer: Self-pay | Admitting: *Deleted

## 2010-08-21 NOTE — Telephone Encounter (Signed)
[facility] called - there is an interaction between Daggett and coreg. OK to fill?

## 2010-08-22 NOTE — Telephone Encounter (Signed)
I don't think I am her doctor anymore

## 2010-08-24 NOTE — Telephone Encounter (Signed)
I have no further comment, I am not her doctor anymore

## 2010-08-24 NOTE — Telephone Encounter (Signed)
Feb 2012 pt req to change from you to DR Asa Lente but she declined so she is still your patient.

## 2010-08-25 NOTE — Telephone Encounter (Signed)
Pt has not tried to fill colcrys at local pharm and Then tried # given for pharm - no answer, no VM. Last RF was test strips from Dr Ronnald Ramp 04/2010. Left pt vm to call office back. Need to know who she thinks is her PCP.

## 2010-08-26 NOTE — Telephone Encounter (Signed)
Patient called [facility] stating that she is returning call back. She would like to be reached at [phone] Thanks

## 2010-10-28 ENCOUNTER — Encounter: Payer: Self-pay | Admitting: Internal Medicine

## 2010-10-29 ENCOUNTER — Ambulatory Visit (INDEPENDENT_AMBULATORY_CARE_PROVIDER_SITE_OTHER): Payer: Medicare Other | Admitting: Internal Medicine

## 2010-10-29 ENCOUNTER — Encounter: Payer: Self-pay | Admitting: Internal Medicine

## 2010-10-29 VITALS — BP 122/80 | HR 61 | Ht 63.0 in | Wt 206.0 lb

## 2010-10-29 DIAGNOSIS — J45909 Unspecified asthma, uncomplicated: Secondary | ICD-10-CM

## 2010-10-29 DIAGNOSIS — G4761 Periodic limb movement disorder: Secondary | ICD-10-CM | POA: Insufficient documentation

## 2010-10-29 DIAGNOSIS — G4733 Obstructive sleep apnea (adult) (pediatric): Secondary | ICD-10-CM

## 2010-10-29 MED ORDER — ALBUTEROL SULFATE HFA 108 (90 BASE) MCG/ACT IN AERS: 2.0000 | INHALATION_SPRAY | RESPIRATORY_TRACT | Status: DC | PRN

## 2010-10-29 MED ORDER — MOMETASONE FURO-FORMOTEROL FUM 100-5 MCG/ACT IN AERO: 2.0000 | INHALATION_SPRAY | Freq: Two times a day (BID) | RESPIRATORY_TRACT | Status: DC

## 2010-10-29 NOTE — Assessment & Plan Note (Signed)
Her cardiologist was concerned about possible OSA in the context of dyspnea, HBP and renal insufficiency We will order and have discussed a standard sleep study

## 2010-10-29 NOTE — Patient Instructions (Addendum)
Order- NPSG split protocol  Dx OSA  Stop Qvar- replace it with sample and script for Dulera 100-5    2 puffs and rinse mouth well, every morning and every evening, every day  Script for Proventil rescue inhaler  OK to use Mucinex to thin mucus.  Consider trying a Neti pot to rinse nasal passages as needed

## 2010-10-29 NOTE — Progress Notes (Signed)
10/29/10- Alllergic rhinitis, asthma, hx urticaria, complicated by DM, renal insufficiency Last here- Dec 05, 2009. Had flu shot She continues to stay with her daughter in [place] for months at a time.  Over the last month has had typical for this season, chest tightness and nasal congestion with wheeze. Qvar not sufficient and she ran out of Singulair. While in [place] she saw cardiologist for dyspnea who did stress test. He recommended she get a sleep study.Treating for HBP and renal insufficiency She is aware of snore, but alone. Denies awareness of daytime sleepiness. Takes alprazolam for occasional insomnia.   ROS Constitutional:   No-   weight loss, night sweats, fevers, chills, fatigue, lassitude. HEENT:   No-  headaches, difficulty swallowing, tooth/dental problems, sore throat,       No-  sneezing, itching, ear ache, nasal congestion, post nasal drip,  CV:  No-   chest pain, orthopnea, PND, swelling in lower extremities, anasarca, +dizziness, No-palpitations Resp: + shortness of breath with exertion or at rest.              +  productive cough,  No non-productive cough,  No-  coughing up of blood.              No-   change in color of mucus.  No- wheezing.   Skin: No-   rash or lesions. GI:  No-   heartburn, indigestion, abdominal pain, nausea, vomiting, diarrhea,                 change in bowel habits, loss of appetite GU: No-   dysuria, change in color of urine, no urgency or frequency.  No- flank pain. MS:  No-   joint pain or swelling.  No- decreased range of motion.  No- back pain. Neuro- grossly normal to observation, Or:  Psych:  No- change in mood or affect. No depression or anxiety.  No memory loss.  OBJ General- Alert, Oriented, Affect-appropriate, Distress- none acute: Obese     Skin- rash-none, lesions- none, excoriation- none Lymphadenopathy- none Head- atraumatic            Eyes- Gross vision intact, PERRLA, conjunctivae clear secretions            Ears- Hearing,  canals-normal            Nose- Clear, no-Septal dev, mucus, polyps, erosion, perforation             Throat- Mallampati III , mucosa clear , drainage- none, tonsils- atrophic Neck- flexible , trachea midline, no stridor , thyroid nl, carotid no bruit Chest - symmetrical excursion , unlabored           Heart/CV- RRR , no murmur , no gallop  , no rub, nl s1 s2                           - JVD- none , edema- none, stasis changes- none, varices- none           Lung- clear to P&A, wheeze- none, cough- none , dullness-none, rub- none           Chest wall-  Abd- tender-no, distended-no, bowel sounds-present, HSM- no Br/ Gen/ Rectal- Not done, not indicated Extrem- cyanosis- none, clubbing, none, atrophy- none, strength- nl Neuro- grossly intact to observation

## 2010-10-31 NOTE — Assessment & Plan Note (Signed)
She  associates the fall season with increased chest tightness, shortness of breath, sometimes cough or wheeze. She doesn't feel she has a cold and this is likely an exacerbation of of her asthma. Plan-refill Proventil, tried Dulera 100-5

## 2010-11-05 ENCOUNTER — Encounter: Payer: Self-pay | Admitting: Internal Medicine

## 2010-11-05 ENCOUNTER — Ambulatory Visit (INDEPENDENT_AMBULATORY_CARE_PROVIDER_SITE_OTHER): Payer: Medicare Other | Admitting: Internal Medicine

## 2010-11-05 VITALS — BP 140/68 | HR 63 | Temp 97.1°F | Wt 205.5 lb

## 2010-11-05 DIAGNOSIS — Z1231 Encounter for screening mammogram for malignant neoplasm of breast: Secondary | ICD-10-CM

## 2010-11-05 DIAGNOSIS — E119 Type 2 diabetes mellitus without complications: Secondary | ICD-10-CM

## 2010-11-05 DIAGNOSIS — I10 Essential (primary) hypertension: Secondary | ICD-10-CM

## 2010-11-05 NOTE — Assessment & Plan Note (Signed)
Continue current meds 

## 2010-11-05 NOTE — Patient Instructions (Signed)
Diabetes, Type 2 Diabetes is a long-lasting (chronic) disease. In type 2 diabetes, the pancreas does not make enough insulin (a hormone), and the body does not respond normally to the insulin that is made. This type of diabetes was also previously called adult-onset diabetes. It usually occurs after the age of 40, but it can occur at any age.  CAUSES  Type 2 diabetes happens because the pancreasis not making enough insulin or your body has trouble using the insulin that your pancreas does make properly. SYMPTOMS   Drinking more than usual.   Urinating more than usual.   Blurred vision.   Dry, itchy skin.   Frequent infections.   Feeling more tired than usual (fatigue).  DIAGNOSIS The diagnosis of type 2 diabetes is usually made by one of the following tests:  Fasting blood glucose test. You will not eat for at least 8 hours and then take a blood test.   Random blood glucose test. Your blood glucose (sugar) is checked at any time of the day regardless of when you ate.   Oral glucose tolerance test (OGTT). Your blood glucose is measured after you have not eaten (fasted) and then after you drink a glucose containing beverage.  TREATMENT   Healthy eating.   Exercise.   Medicine, if needed.   Monitoring blood glucose.   Seeing your caregiver regularly.  HOME CARE INSTRUCTIONS   Check your blood glucose at least once a day. More frequent monitoring may be necessary, depending on your medicines and on how well your diabetes is controlled. Your caregiver will advise you.   Take your medicine as directed by your caregiver.   Do not smoke.   Make wise food choices. Ask your caregiver for information. Weight loss can improve your diabetes.   Learn about low blood glucose (hypoglycemia) and how to treat it.   Get your eyes checked regularly.   Have a yearly physical exam. Have your blood pressure checked and your blood and urine tested.   Wear a pendant or bracelet saying  that you have diabetes.   Check your feet every night for cuts, sores, blisters, and redness. Let your caregiver know if you have any problems.  SEEK MEDICAL CARE IF:   You have problems keeping your blood glucose in target range.   You have problems with your medicines.   You have symptoms of an illness that do not improve after 24 hours.   You have a sore or wound that is not healing.   You notice a change in vision or a new problem with your vision.   You have a fever.  MAKE SURE YOU:  Understand these instructions.   Will watch your condition.   Will get help right away if you are not doing well or get worse.  Document Released: 01/04/2005 Document Revised: 09/17/2010 Document Reviewed: 06/22/2010 [employer] Patient Information 2012 [employer]. 

## 2010-11-05 NOTE — Assessment & Plan Note (Signed)
Her BP is well controlled 

## 2010-11-05 NOTE — Progress Notes (Signed)
Subjective:    Patient ID: Megan [name], female    DOB: 1940-12-09, 70 y.o.   MRN: [id number]  Hypertension This is a chronic problem. The current episode started more than 1 year ago. The problem has been gradually improving since onset. The problem is controlled. Pertinent negatives include no anxiety, blurred vision, chest pain, headaches, neck pain, orthopnea, palpitations, peripheral edema, PND, shortness of breath or sweats. Past treatments include calcium channel blockers, angiotensin blockers and diuretics. The current treatment provides moderate improvement. Compliance problems include diet and exercise.   Diabetes She presents for her follow-up diabetic visit. She has type 2 diabetes mellitus. Her disease course has been stable. There are no hypoglycemic associated symptoms. Pertinent negatives for hypoglycemia include no dizziness, headaches, seizures, speech difficulty, sweats or tremors. Pertinent negatives for diabetes include no blurred vision, no chest pain, no fatigue, no foot paresthesias, no foot ulcerations, no polydipsia, no polyphagia, no polyuria, no visual change, no weakness and no weight loss. There are no hypoglycemic complications. Symptoms are stable. There are no diabetic complications. Current diabetic treatment includes oral agent (triple therapy) and intensive insulin program. She is compliant with treatment all of the time. Her weight is increasing steadily. She is following a generally unhealthy diet. She has not had a previous visit with a dietician. She never participates in exercise. There is no change in her home blood glucose trend. An ACE inhibitor/angiotensin II receptor blocker is being taken. She does not see a podiatrist.Eye exam is current.      Review of Systems  Constitutional: Negative for fever, chills, weight loss, diaphoresis, activity change, appetite change, fatigue and unexpected weight change.  HENT: Negative.  Negative for neck pain.   Eyes:  Negative.  Negative for blurred vision.  Respiratory: Negative for apnea, cough, choking, chest tightness, shortness of breath, wheezing and stridor.   Cardiovascular: Negative for chest pain, palpitations, orthopnea, leg swelling and PND.  Gastrointestinal: Negative for nausea, vomiting, abdominal pain, diarrhea, constipation, abdominal distention and anal bleeding.  Genitourinary: Negative for dysuria, urgency, polyuria, frequency, hematuria, flank pain, decreased urine volume, enuresis, difficulty urinating and dyspareunia.  Musculoskeletal: Negative for myalgias, back pain, joint swelling, arthralgias and gait problem.  Neurological: Negative for dizziness, tremors, seizures, syncope, facial asymmetry, speech difficulty, weakness, light-headedness, numbness and headaches.  Hematological: Negative for polydipsia, polyphagia and adenopathy. Does not bruise/bleed easily.  Psychiatric/Behavioral: Negative.        Objective:   Physical Exam  Vitals reviewed. Constitutional: She is oriented to person, place, and time. She appears well-developed and well-nourished. No distress.  HENT:  Head: Normocephalic and atraumatic.  Mouth/Throat: Oropharynx is clear and moist. No oropharyngeal exudate.  Eyes: Conjunctivae are normal. Right eye exhibits no discharge. Left eye exhibits no discharge. No scleral icterus.  Neck: Normal range of motion. Neck supple. No JVD present. No tracheal deviation present. No thyromegaly present.  Cardiovascular: Normal rate, regular rhythm, normal heart sounds and intact distal pulses.  Exam reveals no gallop and no friction rub.   No murmur heard. Pulmonary/Chest: Effort normal and breath sounds normal. No stridor. No respiratory distress. She has no wheezes. She has no rales. She exhibits no tenderness.  Abdominal: Soft. Bowel sounds are normal. She exhibits no distension and no mass. There is no tenderness. There is no rebound and no guarding.  Musculoskeletal: Normal  range of motion. She exhibits no edema and no tenderness.  Lymphadenopathy:    She has no cervical adenopathy.  Neurological: She is oriented to person,  place, and time. She displays normal reflexes. She exhibits normal muscle tone. Coordination normal.  Skin: Skin is warm and dry. No rash noted. She is not diaphoretic. No erythema. No pallor.  Psychiatric: She has a normal mood and affect. Her behavior is normal. Judgment and thought content normal.          Assessment & Plan:

## 2010-11-12 ENCOUNTER — Encounter (HOSPITAL_BASED_OUTPATIENT_CLINIC_OR_DEPARTMENT_OTHER): Payer: Medicare Other

## 2010-11-18 ENCOUNTER — Other Ambulatory Visit: Payer: Self-pay | Admitting: Internal Medicine

## 2010-11-18 MED ORDER — ROSUVASTATIN CALCIUM 10 MG PO TABS: 10.0000 mg | ORAL_TABLET | Freq: Every day | ORAL | Status: DC

## 2010-11-19 ENCOUNTER — Ambulatory Visit (HOSPITAL_BASED_OUTPATIENT_CLINIC_OR_DEPARTMENT_OTHER): Payer: Medicare Other | Attending: Internal Medicine

## 2010-11-19 DIAGNOSIS — R259 Unspecified abnormal involuntary movements: Secondary | ICD-10-CM | POA: Insufficient documentation

## 2010-11-19 DIAGNOSIS — Z79899 Other long term (current) drug therapy: Secondary | ICD-10-CM | POA: Insufficient documentation

## 2010-11-19 DIAGNOSIS — G4733 Obstructive sleep apnea (adult) (pediatric): Secondary | ICD-10-CM

## 2010-11-19 DIAGNOSIS — G4700 Insomnia, unspecified: Secondary | ICD-10-CM | POA: Insufficient documentation

## 2010-11-21 DIAGNOSIS — Z79899 Other long term (current) drug therapy: Secondary | ICD-10-CM

## 2010-11-21 DIAGNOSIS — R259 Unspecified abnormal involuntary movements: Secondary | ICD-10-CM

## 2010-11-21 DIAGNOSIS — G4730 Sleep apnea, unspecified: Secondary | ICD-10-CM

## 2010-11-21 DIAGNOSIS — G4700 Insomnia, unspecified: Secondary | ICD-10-CM

## 2010-11-21 NOTE — Procedures (Signed)
NAME:  Megan [name], Megan [name]               ACCOUNT NO.:  [id number]  MEDICAL RECORD NO.:  [id number]          PATIENT TYPE:  OUT  LOCATION:  SLEEP CENTER                 FACILITY[facility]  [facility]  PHYSICIAN:  Alistar Mcenery D. Annamaria Boots, MD, FCCP, FACPDATE OF BIRTH:  09-13-39  DATE OF STUDY:  11/19/2010                           NOCTURNAL POLYSOMNOGRAM  REFERRING PHYSICIAN:  Cannie Muckle D. Annamaria Boots, MD, FCCP, FACP  REFERRING PHYSICIAN:  Ashaad Gaertner D. Tyleek Smick, MD, FCCP, FACP  INDICATION FOR STUDY:  Insomnia with sleep apnea.  EPWORTH SLEEPINESS SCORE:  3/24.  BMI 36.3, weight 205 pounds, height 63 inches, neck 15 inches.  Home medications are charted and reviewed.  SLEEP ARCHITECTURE:  Total sleep time 237.5 minutes with sleep efficiency 64%.  Stage I was 18.5%.  Stage II, 81.5%, stages III and REM were absent.  Sleep latency 60.5 minutes.  Awake after sleep onset 61 minutes.  Arousal index 45.2.  BEDTIME MEDICATION:  Famotidine, Effexor, Crestor, Ventolin, Ambien, Colcrys, diltiazem.  RESPIRATORY DATA:  Apnea hypopnea index (AHI) 4.5 per hour.  A total of 18 events was scored including 1 obstructive apnea and 17 hypopneas. The events were all associated with nonsupine sleep.  There was no REM. There were insufficient numbers of events for application of CPAP titration by split protocol.  OXYGEN DATA:  Moderate snoring with oxygen desaturation to a nadir of 88% and a mean oxygen saturation through the study of 91.9% on room air. No time was recorded with saturation less than 88%.  CARDIAC DATA:  Normal sinus rhythm.  MOVEMENT/PARASOMNIA:  Frequent limb jerks with sleep disturbance.  A total of 181 limb jerks were counted, of which 59 were associated with arousals or awakening for periodic limb movement with arousal index of 14.9 per hour.  Bathroom x2.  IMPRESSION/RECOMMENDATION: 1. Sleep architecture was marked by frequent waking throughout the     study night.  There was no REM or delta sleep. 2. Occasional  respiratory events with sleep disturbance, within normal     limits.  AHI 4.5 per hour (normal range for adult is from 0-5     events per hour).  Moderate snoring with oxygen desaturation to a     nadir of 88% and a mean oxygen saturation through the study of     91.9% on room air. 3. Periodic limb movement with arousal syndrome.  A total of 181 limb     jerks were counted, of which 59 were associated with arousals or     awakening for periodic limb movement with arousal index of 14.9 per     hour, which     may be significant as a basis for sleep fragmentation.  Specific     therapy such as ReQuip or Mirapex might be considered if clinically     appropriate.     Idolina Mantell D. Annamaria Boots, MD, [facility], [place], Tax adviser of Sleep Medicine Electronically Signed    CDY/MEDQ  D:  11/21/2010 10:03:14  T:  11/21/2010 22:10:14  Job:  [id number]

## 2010-11-25 ENCOUNTER — Encounter: Payer: Self-pay | Admitting: Internal Medicine

## 2010-11-25 LAB — CBC
Hemoglobin: 12.1 g/dL (ref 12.0–16.0)
MCH: 29.1
MPV: 10.3 fL (ref 7.8–11)
RBC: 4.16

## 2010-11-25 LAB — BASIC METABOLIC PANEL WITH GFR
BUN: 35 mg/dL — AB (ref 4–21)
Creat: 1.9
EGFR: 27.7 mg/dL
Glucose: 195
Potassium: 4.6 mmol/L

## 2010-11-25 LAB — CALCIUM: Calcium: 9.3 mg/dL

## 2010-11-25 LAB — LIPID PANEL
Direct LDL: 73
HDL: 60 mg/dL (ref 35–70)
Triglycerides: 142

## 2010-11-25 LAB — PHOSPHORUS: Hemoglobin-A1c: 8.4

## 2010-12-03 ENCOUNTER — Ambulatory Visit: Payer: Medicare Other | Admitting: Internal Medicine

## 2010-12-06 ENCOUNTER — Other Ambulatory Visit: Payer: Self-pay | Admitting: Internal Medicine

## 2010-12-09 LAB — HM DIABETES EYE EXAM

## 2010-12-15 ENCOUNTER — Telehealth: Payer: Self-pay | Admitting: *Deleted

## 2010-12-15 MED ORDER — FAMOTIDINE 40 MG PO TABS: 40.0000 mg | ORAL_TABLET | Freq: Two times a day (BID) | ORAL | Status: DC

## 2010-12-15 NOTE — Telephone Encounter (Signed)
Pt will be having Bxs on Right Lower Eyelid and they need Pt to be off of Plavix before procedure. Caller would like to know: Is it [facility] for patient to hold Plavix?                                           How long do you recommend Pt to be off of Plavix before procedure?

## 2010-12-15 NOTE — Telephone Encounter (Signed)
Off plavix 5 days before procedure

## 2010-12-15 NOTE — Telephone Encounter (Signed)
Caller informed.  Also filled Pepcid per patient request.

## 2010-12-30 ENCOUNTER — Ambulatory Visit: Payer: Medicare Other | Admitting: Internal Medicine

## 2011-01-05 ENCOUNTER — Ambulatory Visit (INDEPENDENT_AMBULATORY_CARE_PROVIDER_SITE_OTHER): Payer: Medicare Other | Admitting: Internal Medicine

## 2011-01-05 ENCOUNTER — Encounter: Payer: Self-pay | Admitting: Internal Medicine

## 2011-01-05 DIAGNOSIS — J309 Allergic rhinitis, unspecified: Secondary | ICD-10-CM

## 2011-01-05 DIAGNOSIS — G4733 Obstructive sleep apnea (adult) (pediatric): Secondary | ICD-10-CM

## 2011-01-05 DIAGNOSIS — J45909 Unspecified asthma, uncomplicated: Secondary | ICD-10-CM

## 2011-01-05 MED ORDER — AEROCHAMBER MV MISC: Status: AC

## 2011-01-05 MED ORDER — MOMETASONE FUROATE 50 MCG/ACT NA SUSP: 2.0000 | Freq: Every day | NASAL | Status: DC

## 2011-01-05 MED ORDER — BECLOMETHASONE DIPROPIONATE 40 MCG/ACT IN AERS: 2.0000 | INHALATION_SPRAY | Freq: Two times a day (BID) | RESPIRATORY_TRACT | Status: DC

## 2011-01-05 NOTE — Progress Notes (Signed)
10/29/10- Alllergic rhinitis, asthma, hx urticaria, complicated by DM, renal insufficiency Last here- Dec 05, 2009. Had flu shot She continues to stay with her daughter in [place] for months at a time.  Over the last month has had typical for this season, chest tightness and nasal congestion with wheeze. Qvar not sufficient and she ran out of Singulair. While in [place] she saw cardiologist for dyspnea who did stress test. He recommended she get a sleep study.Treating for HBP and renal insufficiency She is aware of snore, but alone. Denies awareness of daytime sleepiness. Takes alprazolam for occasional insomnia.   01/05/11- Alllergic rhinitis, asthma, hx urticaria, complicated by DM, renal insufficiency Husband here Nose became congested overnight but chest ok. Got saline sinus rinse.  Asthma-preferred Qvar to [facility].  Sleep- wakes frequently unless she takes [place]. She doesn't mind the limb jerks. Discussed meds and oxygen for sleep.   ROS Constitutional:   No-   weight loss, night sweats, fevers, chills, fatigue, lassitude. HEENT:   No-  headaches, difficulty swallowing, tooth/dental problems, sore throat,       No-  sneezing, itching, ear ache, +nasal congestion, post nasal drip,  CV:  No-   chest pain, orthopnea, PND, swelling in lower extremities, anasarca, +dizziness, No-palpitations Resp: + shortness of breath with exertion or at rest.              +  productive cough,  No non-productive cough,  No-  coughing up of blood.              No-   change in color of mucus.  No- wheezing.   Skin: No-   rash or lesions. GI:  No-   heartburn, indigestion, abdominal pain, nausea, vomiting, diarrhea,                 change in bowel habits, loss of appetite GU: MS:  No-   joint pain or swelling.  No- decreased range of motion.  No- back pain. Neuro- grossly normal to observation, Or:  Psych:  No- change in mood or affect. No depression or anxiety.  No memory loss.  OBJ General- Alert,  Oriented, Affect-appropriate, Distress- none acute: Obese     Skin- rash-none, lesions- none, excoriation- none Lymphadenopathy- none Head- atraumatic            Eyes- Gross vision intact, PERRLA, conjunctivae clear secretions            Ears- Hearing, canals-normal            Nose- mild turbinate edema, no-Septal dev, mucus, polyps, erosion, perforation             Throat- Mallampati III , mucosa clear , drainage- none, tonsils- atrophic Neck- flexible , trachea midline, no stridor , thyroid nl, carotid no bruit Chest - symmetrical excursion , unlabored           Heart/CV- RRR , no murmur , no gallop  , no rub, nl s1 s2                           - JVD- none , edema- none, stasis changes- none, varices- none           Lung- clear to P&A, wheeze- none, cough- none , dullness-none, rub- none           Chest wall-  Abd- tender-no, distended-no, bowel sounds-present, HSM- no Br/ Gen/ Rectal- Not done, not indicated Extrem- cyanosis- none, clubbing,  none, atrophy- none, strength- nl Neuro- grossly intact to observation

## 2011-01-05 NOTE — Patient Instructions (Signed)
Script for aerochamber to use with Qvar  Scripts sent for Nasonex and Qvar

## 2011-01-08 NOTE — Assessment & Plan Note (Signed)
We are changing Dulera to Qvar with a spacer.

## 2011-01-08 NOTE — Assessment & Plan Note (Signed)
Plan- retry Nasonex

## 2011-01-08 NOTE — Assessment & Plan Note (Signed)
She takes [place] for the fragmented sleep and doesn't care about the leg jerks enough to consider Requip trial.

## 2011-02-04 ENCOUNTER — Ambulatory Visit (INDEPENDENT_AMBULATORY_CARE_PROVIDER_SITE_OTHER): Payer: Medicare Other | Admitting: Internal Medicine

## 2011-02-04 ENCOUNTER — Encounter: Payer: Self-pay | Admitting: Internal Medicine

## 2011-02-04 ENCOUNTER — Other Ambulatory Visit (INDEPENDENT_AMBULATORY_CARE_PROVIDER_SITE_OTHER): Payer: Medicare Other

## 2011-02-04 DIAGNOSIS — N259 Disorder resulting from impaired renal tubular function, unspecified: Secondary | ICD-10-CM

## 2011-02-04 DIAGNOSIS — E785 Hyperlipidemia, unspecified: Secondary | ICD-10-CM

## 2011-02-04 DIAGNOSIS — N390 Urinary tract infection, site not specified: Secondary | ICD-10-CM

## 2011-02-04 DIAGNOSIS — E119 Type 2 diabetes mellitus without complications: Secondary | ICD-10-CM | POA: Diagnosis not present

## 2011-02-04 DIAGNOSIS — I10 Essential (primary) hypertension: Secondary | ICD-10-CM

## 2011-02-04 DIAGNOSIS — K219 Gastro-esophageal reflux disease without esophagitis: Secondary | ICD-10-CM

## 2011-02-04 LAB — URINALYSIS, ROUTINE W REFLEX MICROSCOPIC
Bilirubin Urine: NEGATIVE
Hgb urine dipstick: NEGATIVE
Urine Glucose: NEGATIVE
Urobilinogen, UA: 0.2 (ref 0.0–1.0)

## 2011-02-04 MED ORDER — ESOMEPRAZOLE MAGNESIUM 40 MG PO CPDR: 40.0000 mg | DELAYED_RELEASE_CAPSULE | Freq: Every day | ORAL | Status: DC

## 2011-02-04 NOTE — Progress Notes (Signed)
Subjective:    Patient ID: Megan [name], female    DOB: Jun 29, 1940, 71 y.o.   MRN: [id number]  Gastrophageal Reflux She complains of heartburn. She reports no abdominal pain, no belching, no chest pain, no choking, no coughing, no dysphagia, no early satiety, no globus sensation, no hoarse voice, no nausea, no sore throat, no stridor, no tooth decay, no water brash or no wheezing. This is a chronic problem. The current episode started more than 1 year ago. The problem occurs occasionally. The problem has been gradually worsening. The heartburn duration is several minutes. The heartburn is located in the substernum. The heartburn is of mild intensity. The heartburn does not wake her from sleep. The heartburn does not limit her activity. The heartburn doesn't change with position. The symptoms are aggravated by certain foods. Pertinent negatives include no anemia, fatigue, melena, muscle weakness, orthopnea or weight loss. She has tried a histamine-2 antagonist for the symptoms. The treatment provided mild relief. Past procedures include an EGD.  Hypertension This is a chronic problem. The current episode started more than 1 year ago. The problem has been gradually improving since onset. The problem is controlled. Pertinent negatives include no anxiety, blurred vision, chest pain, headaches, malaise/fatigue, neck pain, orthopnea, palpitations, peripheral edema, PND, shortness of breath or sweats. Past treatments include diuretics, calcium channel blockers and beta blockers. The current treatment provides significant improvement. Compliance problems include exercise.   Diabetes She presents for her follow-up diabetic visit. She has type 2 diabetes mellitus. There are no hypoglycemic associated symptoms. Pertinent negatives for hypoglycemia include no dizziness, headaches, pallor, seizures, speech difficulty, sweats or tremors. Associated symptoms include polyuria. Pertinent negatives for diabetes include no  blurred vision, no chest pain, no fatigue, no foot paresthesias, no foot ulcerations, no polydipsia, no polyphagia, no visual change, no weakness and no weight loss. There are no hypoglycemic complications. Symptoms are stable. There are no diabetic complications. Current diabetic treatment includes intensive insulin program and oral agent (dual therapy). She is compliant with treatment all of the time. Her weight is stable. She is following a generally healthy diet. Meal planning includes avoidance of concentrated sweets. She has not had a previous visit with a dietician. She never participates in exercise. Her home blood glucose trend is increasing steadily. Her breakfast blood glucose range is generally 110-130 mg/dl. Her lunch blood glucose range is generally 140-180 mg/dl. Her dinner blood glucose range is generally >200 mg/dl. Her highest blood glucose is >200 mg/dl. Her overall blood glucose range is 180-200 mg/dl. An ACE inhibitor/angiotensin II receptor blocker is being taken. She does not see a podiatrist.Eye exam is current.      Review of Systems  Constitutional: Negative for fever, chills, weight loss, malaise/fatigue, diaphoresis, activity change, appetite change, fatigue and unexpected weight change.  HENT: Negative.  Negative for sore throat, hoarse voice, neck pain and voice change.   Eyes: Negative.  Negative for blurred vision.  Respiratory: Negative for cough, choking, chest tightness, shortness of breath, wheezing and stridor.   Cardiovascular: Negative for chest pain, palpitations, orthopnea, leg swelling and PND.  Gastrointestinal: Positive for heartburn. Negative for dysphagia, nausea, vomiting, abdominal pain, diarrhea, constipation, blood in stool, melena, abdominal distention, anal bleeding and rectal pain.  Genitourinary: Positive for dysuria and polyuria. Negative for urgency, frequency, hematuria, flank pain, decreased urine volume, enuresis, difficulty urinating and  dyspareunia.  Musculoskeletal: Negative for myalgias, back pain, joint swelling, arthralgias, gait problem and muscle weakness.  Skin: Negative.  Negative for color  change, pallor, rash and wound.  Neurological: Negative for dizziness, tremors, seizures, syncope, facial asymmetry, speech difficulty, weakness, light-headedness, numbness and headaches.  Hematological: Negative for polydipsia, polyphagia and adenopathy. Does not bruise/bleed easily.  Psychiatric/Behavioral: Negative.        Objective:   Physical Exam  Vitals reviewed. Constitutional: She is oriented to person, place, and time. She appears well-developed and well-nourished. No distress.  HENT:  Head: Normocephalic and atraumatic.  Mouth/Throat: Oropharynx is clear and moist. No oropharyngeal exudate.  Eyes: Conjunctivae are normal. Right eye exhibits no discharge. Left eye exhibits no discharge. No scleral icterus.  Neck: Normal range of motion. Neck supple. No JVD present. No tracheal deviation present. No thyromegaly present.  Cardiovascular: Normal rate, regular rhythm, normal heart sounds and intact distal pulses.  Exam reveals no gallop and no friction rub.   No murmur heard. Pulmonary/Chest: Effort normal and breath sounds normal. No stridor. No respiratory distress. She has no wheezes. She has no rales. She exhibits no tenderness.  Abdominal: Soft. Bowel sounds are normal. She exhibits no distension and no mass. There is no tenderness. There is no rebound and no guarding.  Musculoskeletal: Normal range of motion. She exhibits no edema and no tenderness.  Lymphadenopathy:    She has no cervical adenopathy.  Neurological: She is oriented to person, place, and time.  Skin: Skin is warm and dry. No rash noted. She is not diaphoretic. No erythema. No pallor.  Psychiatric: She has a normal mood and affect. Her behavior is normal. Judgment and thought content normal.      Lab Results  Component Value Date   WBC 8.1  10/01/2010   HGB 12.1 10/01/2010   HCT 37 10/01/2010   PLT 208 10/01/2010   GLUCOSE 102* 12/24/2009   CHOL 157 11/25/2010   TRIG 142 11/25/2010   HDL 60 11/25/2010   LDLDIRECT 73 11/25/2010   ALT 16 10/30/2009   AST 16 10/30/2009   NA 136* 10/01/2010   K 4.6 10/01/2010   CL 103 10/01/2010   CREATININE 1.9 10/01/2010   BUN 35* 10/01/2010   CO2 23 10/01/2010   HGBA1C 8.4 10/01/2010      Assessment & Plan:

## 2011-02-04 NOTE — Patient Instructions (Signed)
Diabetes, Type 2 Diabetes is a long-lasting (chronic) disease. In type 2 diabetes, the pancreas does not make enough insulin (a hormone), and the body does not respond normally to the insulin that is made. This type of diabetes was also previously called adult-onset diabetes. It usually occurs after the age of 70, but it can occur at any age.  CAUSES  Type 2 diabetes happens because the pancreasis not making enough insulin or your body has trouble using the insulin that your pancreas does make properly. SYMPTOMS   Drinking more than usual.   Urinating more than usual.   Blurred vision.   Dry, itchy skin.   Frequent infections.   Feeling more tired than usual (fatigue).  DIAGNOSIS The diagnosis of type 2 diabetes is usually made by one of the following tests:  Fasting blood glucose test. You will not eat for at least 8 hours and then take a blood test.   Random blood glucose test. Your blood glucose (sugar) is checked at any time of the day regardless of when you ate.   Oral glucose tolerance test (OGTT). Your blood glucose is measured after you have not eaten (fasted) and then after you drink a glucose containing beverage.  TREATMENT   Healthy eating.   Exercise.   Medicine, if needed.   Monitoring blood glucose.   Seeing your caregiver regularly.  HOME CARE INSTRUCTIONS   Check your blood glucose at least once a day. More frequent monitoring may be necessary, depending on your medicines and on how well your diabetes is controlled. Your caregiver will advise you.   Take your medicine as directed by your caregiver.   Do not smoke.   Make wise food choices. Ask your caregiver for information. Weight loss can improve your diabetes.   Learn about low blood glucose (hypoglycemia) and how to treat it.   Get your eyes checked regularly.   Have a yearly physical exam. Have your blood pressure checked and your blood and urine tested.   Wear a pendant or bracelet saying  that you have diabetes.   Check your feet every night for cuts, sores, blisters, and redness. Let your caregiver know if you have any problems.  SEEK MEDICAL CARE IF:   You have problems keeping your blood glucose in target range.   You have problems with your medicines.   You have symptoms of an illness that do not improve after 24 hours.   You have a sore or wound that is not healing.   You notice a change in vision or a new problem with your vision.   You have a fever.  MAKE SURE YOU:  Understand these instructions.   Will watch your condition.   Will get help right away if you are not doing well or get worse.  Document Released: 01/04/2005 Document Revised: 09/17/2010 Document Reviewed: 06/22/2010 [facility] Patient Information 2012 [place].Gastroesophageal Reflux Disease, Adult Gastroesophageal reflux disease (GERD) happens when acid from your stomach flows up into the esophagus. When acid comes in contact with the esophagus, the acid causes soreness (inflammation) in the esophagus. Over time, GERD may create small holes (ulcers) in the lining of the esophagus. CAUSES   Increased body weight. This puts pressure on the stomach, making acid rise from the stomach into the esophagus.   Smoking. This increases acid production in the stomach.   Drinking alcohol. This causes decreased pressure in the lower esophageal sphincter (valve or ring of muscle between the esophagus and stomach), allowing acid  from the stomach into the esophagus.   Late evening meals and a full stomach. This increases pressure and acid production in the stomach.   A malformed lower esophageal sphincter.  Sometimes, no cause is found. SYMPTOMS   Burning pain in the lower part of the mid-chest behind the breastbone and in the mid-stomach area. This may occur twice a week or more often.   Trouble swallowing.   Sore throat.   Dry cough.   Asthma-like symptoms including chest tightness, shortness  of breath, or wheezing.  DIAGNOSIS  Your caregiver may be able to diagnose GERD based on your symptoms. In some cases, X-rays and other tests may be done to check for complications or to check the condition of your stomach and esophagus. TREATMENT  Your caregiver may recommend over-the-counter or prescription medicines to help decrease acid production. Ask your caregiver before starting or adding any new medicines.  HOME CARE INSTRUCTIONS   Change the factors that you can control. Ask your caregiver for guidance concerning weight loss, quitting smoking, and alcohol consumption.   Avoid foods and drinks that make your symptoms worse, such as:   Caffeine or alcoholic drinks.   Chocolate.   Peppermint or mint flavorings.   Garlic and onions.   Spicy foods.   Citrus fruits, such as oranges, lemons, or limes.   Tomato-based foods such as sauce, chili, salsa, and pizza.   Fried and fatty foods.   Avoid lying down for the 3 hours prior to your bedtime or prior to taking a nap.   Eat small, frequent meals instead of large meals.   Wear loose-fitting clothing. Do not wear anything tight around your waist that causes pressure on your stomach.   Raise the head of your bed 6 to 8 inches with wood blocks to help you sleep. Extra pillows will not help.   Only take over-the-counter or prescription medicines for pain, discomfort, or fever as directed by your caregiver.   Do not take aspirin, ibuprofen, or other nonsteroidal anti-inflammatory drugs (NSAIDs).  SEEK IMMEDIATE MEDICAL CARE IF:   You have pain in your arms, neck, jaw, teeth, or back.   Your pain increases or changes in intensity or duration.   You develop nausea, vomiting, or sweating (diaphoresis).   You develop shortness of breath, or you faint.   Your vomit is green, yellow, black, or looks like coffee grounds or blood.   Your stool is red, bloody, or black.  These symptoms could be signs of other problems, such as  heart disease, gastric bleeding, or esophageal bleeding. MAKE SURE YOU:   Understand these instructions.   Will watch your condition.   Will get help right away if you are not doing well or get worse.  Document Released: 10/14/2004 Document Revised: 09/16/2010 Document Reviewed: 07/24/2010 [facility] Patient Information 2012 [place].

## 2011-02-04 NOTE — Assessment & Plan Note (Signed)
BP is well controlled, I will check her lytes and renal function

## 2011-02-04 NOTE — Assessment & Plan Note (Signed)
FLP today 

## 2011-02-04 NOTE — Assessment & Plan Note (Signed)
I will check her renal function and UA, I have asked her to avoid nsaids

## 2011-02-04 NOTE — Assessment & Plan Note (Signed)
I will check her UA and urine clx today

## 2011-02-04 NOTE — Assessment & Plan Note (Signed)
Change the H2 blocker to nexium for better symptom relief

## 2011-02-04 NOTE — Assessment & Plan Note (Signed)
It sounds like her blood sugars are not well controlled, I will check her a1c and make changes as needed

## 2011-02-05 ENCOUNTER — Encounter: Payer: Self-pay | Admitting: Internal Medicine

## 2011-02-05 LAB — COMPREHENSIVE METABOLIC PANEL
AST: 28 U/L (ref 0–37)
Albumin: 3.7 g/dL (ref 3.5–5.2)
BUN: 32 mg/dL — ABNORMAL HIGH (ref 6–23)
Calcium: 9 mg/dL (ref 8.4–10.5)
Chloride: 102 mEq/L (ref 96–112)
Glucose, Bld: 255 mg/dL — ABNORMAL HIGH (ref 70–99)
Potassium: 4.6 mEq/L (ref 3.5–5.1)

## 2011-02-05 LAB — LIPID PANEL: Cholesterol: 165 mg/dL (ref 0–200)

## 2011-02-06 LAB — CULTURE, URINE COMPREHENSIVE

## 2011-02-09 ENCOUNTER — Telehealth: Payer: Self-pay

## 2011-02-09 NOTE — Telephone Encounter (Signed)
Letter mailed out to patient.

## 2011-02-09 NOTE — Telephone Encounter (Signed)
Patient called requesting a call back from the nurse for lab results, [phone]

## 2011-03-20 ENCOUNTER — Other Ambulatory Visit: Payer: Self-pay | Admitting: Internal Medicine

## 2011-04-09 ENCOUNTER — Telehealth: Payer: Self-pay

## 2011-04-09 DIAGNOSIS — K219 Gastro-esophageal reflux disease without esophagitis: Secondary | ICD-10-CM

## 2011-04-09 MED ORDER — ESOMEPRAZOLE MAGNESIUM 40 MG PO CPDR: 40.0000 mg | DELAYED_RELEASE_CAPSULE | Freq: Every day | ORAL | Status: DC

## 2011-04-09 NOTE — Telephone Encounter (Signed)
Pt request refill of Nexium

## 2011-04-21 DIAGNOSIS — E119 Type 2 diabetes mellitus without complications: Secondary | ICD-10-CM | POA: Diagnosis not present

## 2011-04-21 DIAGNOSIS — L03039 Cellulitis of unspecified toe: Secondary | ICD-10-CM | POA: Diagnosis not present

## 2011-04-21 DIAGNOSIS — M2010 Hallux valgus (acquired), unspecified foot: Secondary | ICD-10-CM | POA: Diagnosis not present

## 2011-05-11 ENCOUNTER — Other Ambulatory Visit: Payer: Self-pay | Admitting: Internal Medicine

## 2011-05-13 ENCOUNTER — Other Ambulatory Visit (INDEPENDENT_AMBULATORY_CARE_PROVIDER_SITE_OTHER): Payer: Medicare Other

## 2011-05-13 ENCOUNTER — Ambulatory Visit (INDEPENDENT_AMBULATORY_CARE_PROVIDER_SITE_OTHER): Payer: Medicare Other | Admitting: Internal Medicine

## 2011-05-13 ENCOUNTER — Encounter: Payer: Self-pay | Admitting: Internal Medicine

## 2011-05-13 VITALS — BP 130/70 | HR 72 | Temp 97.4°F | Resp 16 | Wt 205.0 lb

## 2011-05-13 DIAGNOSIS — I10 Essential (primary) hypertension: Secondary | ICD-10-CM | POA: Diagnosis not present

## 2011-05-13 DIAGNOSIS — N259 Disorder resulting from impaired renal tubular function, unspecified: Secondary | ICD-10-CM

## 2011-05-13 DIAGNOSIS — E119 Type 2 diabetes mellitus without complications: Secondary | ICD-10-CM

## 2011-05-13 DIAGNOSIS — E785 Hyperlipidemia, unspecified: Secondary | ICD-10-CM

## 2011-05-13 DIAGNOSIS — N390 Urinary tract infection, site not specified: Secondary | ICD-10-CM | POA: Diagnosis not present

## 2011-05-13 DIAGNOSIS — R10812 Left upper quadrant abdominal tenderness: Secondary | ICD-10-CM

## 2011-05-13 DIAGNOSIS — M109 Gout, unspecified: Secondary | ICD-10-CM

## 2011-05-13 DIAGNOSIS — K219 Gastro-esophageal reflux disease without esophagitis: Secondary | ICD-10-CM

## 2011-05-13 DIAGNOSIS — Z23 Encounter for immunization: Secondary | ICD-10-CM | POA: Diagnosis not present

## 2011-05-13 DIAGNOSIS — Z1231 Encounter for screening mammogram for malignant neoplasm of breast: Secondary | ICD-10-CM

## 2011-05-13 LAB — CBC WITH DIFFERENTIAL/PLATELET
Basophils Relative: 0.5 % (ref 0.0–3.0)
Eosinophils Relative: 1.7 % (ref 0.0–5.0)
HCT: 38 % (ref 36.0–46.0)
Hemoglobin: 12.6 g/dL (ref 12.0–15.0)
Lymphs Abs: 2.1 10*3/uL (ref 0.7–4.0)
Monocytes Relative: 8.7 % (ref 3.0–12.0)
Neutro Abs: 4.3 10*3/uL (ref 1.4–7.7)
WBC: 7.2 10*3/uL (ref 4.5–10.5)

## 2011-05-13 LAB — URINALYSIS, ROUTINE W REFLEX MICROSCOPIC
Bilirubin Urine: NEGATIVE
Nitrite: NEGATIVE
Specific Gravity, Urine: >=1.03 (ref 1.000–1.030)
Total Protein, Urine: 100
pH: 5.5 (ref 5.0–8.0)

## 2011-05-13 LAB — HEMOGLOBIN A1C: Hgb A1c MFr Bld: 9.7 % — ABNORMAL HIGH (ref 4.6–6.5)

## 2011-05-13 LAB — LIPID PANEL
HDL: 59.3 mg/dL (ref >39.00)
Triglycerides: 210 mg/dL — ABNORMAL HIGH (ref 0.0–149.0)

## 2011-05-13 LAB — LDL CHOLESTEROL, DIRECT: Direct LDL: 91.4 mg/dL

## 2011-05-13 LAB — AMYLASE: Amylase: 92 U/L (ref 27–131)

## 2011-05-13 LAB — CK: Total CK: 126 U/L (ref 7–177)

## 2011-05-13 LAB — HM DIABETES FOOT EXAM: HM Diabetic Foot Exam: NORMAL

## 2011-05-13 MED ORDER — ESOMEPRAZOLE MAGNESIUM 40 MG PO CPDR: 40.0000 mg | DELAYED_RELEASE_CAPSULE | Freq: Every day | ORAL | Status: DC

## 2011-05-13 MED ORDER — COLESEVELAM HCL 3.75 G PO PACK: 1.0000 | PACK | Freq: Every day | ORAL | Status: DC

## 2011-05-13 MED ORDER — INSULIN GLARGINE 100 UNIT/ML ~~LOC~~ SOLN: 50.0000 [IU] | Freq: Every day | SUBCUTANEOUS | Status: DC

## 2011-05-13 MED ORDER — ALPRAZOLAM 0.5 MG PO TABS: 0.5000 mg | ORAL_TABLET | Freq: Three times a day (TID) | ORAL | Status: DC | PRN

## 2011-05-13 NOTE — Patient Instructions (Signed)

## 2011-05-13 NOTE — Progress Notes (Signed)
Subjective:    Patient ID: Megan [name], female    DOB: 08/08/1940, 71 y.o.   MRN: [id number]  Abdominal Pain This is a recurrent problem. The current episode started more than 1 month ago. The onset quality is gradual. The problem occurs intermittently. The problem has been gradually worsening. The pain is located in the LUQ. The pain is at a severity of 2/10. The pain is mild. The quality of the pain is aching. The abdominal pain does not radiate. Associated symptoms include arthralgias (right hip and both shoulders) and diarrhea. Pertinent negatives include no anorexia, belching, constipation, dysuria, fever, flatus, frequency, headaches, hematochezia, hematuria, melena, myalgias, nausea, vomiting or weight loss. The pain is aggravated by nothing. The pain is relieved by nothing. She has tried proton pump inhibitors and H2 blockers for the symptoms. The treatment provided no relief.  Diabetes She presents for her follow-up diabetic visit. She has type 2 diabetes mellitus. Her disease course has been stable. Hypoglycemia symptoms include nervousness/anxiousness. Pertinent negatives for hypoglycemia include no confusion, dizziness, headaches, pallor, seizures, speech difficulty, sweats or tremors. Associated symptoms include polydipsia, polyphagia and polyuria. Pertinent negatives for diabetes include no blurred vision, no chest pain, no fatigue, no foot paresthesias, no foot ulcerations, no visual change, no weakness and no weight loss. There are no hypoglycemic complications. Symptoms are stable. Diabetic complications include nephropathy. She is compliant with treatment all of the time. Her weight is stable. She is following a generally unhealthy diet. When asked about meal planning, she reported none. She has not had a previous visit with a dietician. She never participates in exercise. Her home blood glucose trend is increasing steadily. Her breakfast blood glucose range is generally 140-180 mg/dl.  Her lunch blood glucose range is generally 180-200 mg/dl. Her dinner blood glucose range is generally 180-200 mg/dl. Her highest blood glucose is >200 mg/dl. Her overall blood glucose range is 180-200 mg/dl. An ACE inhibitor/angiotensin II receptor blocker is being taken. She does not see a podiatrist.Eye exam is current.  Hypertension This is a chronic problem. The current episode started more than 1 year ago. The problem is unchanged. The problem is controlled. Associated symptoms include anxiety. Pertinent negatives include no blurred vision, chest pain, headaches, malaise/fatigue, neck pain, orthopnea, palpitations, peripheral edema, PND, shortness of breath or sweats. There is no history of chronic renal disease.  Hyperlipidemia This is a chronic problem. The current episode started more than 1 year ago. The problem is controlled. Recent lipid tests were reviewed and are variable. Exacerbating diseases include diabetes and obesity. She has no history of chronic renal disease, hypothyroidism, liver disease or nephrotic syndrome. Factors aggravating her hyperlipidemia include thiazides and fatty foods. Pertinent negatives include no chest pain, focal sensory loss, focal weakness, leg pain, myalgias or shortness of breath. Current antihyperlipidemic treatment includes statins. The current treatment provides moderate improvement of lipids. Compliance problems include adherence to exercise and adherence to diet.       Review of Systems  Constitutional: Positive for unexpected weight change (some weight gain). Negative for fever, chills, weight loss, malaise/fatigue, diaphoresis, activity change, appetite change and fatigue.  HENT: Negative.  Negative for neck pain.   Eyes: Negative.  Negative for blurred vision.  Respiratory: Negative for apnea, cough, choking, chest tightness, shortness of breath, wheezing and stridor.   Cardiovascular: Negative for chest pain, palpitations, orthopnea, leg swelling  and PND.  Gastrointestinal: Positive for abdominal pain and diarrhea. Negative for nausea, vomiting, constipation, blood in stool, melena, hematochezia, abdominal  distention, anal bleeding, rectal pain, anorexia and flatus.  Genitourinary: Positive for polyuria. Negative for dysuria, urgency, frequency, hematuria, flank pain, decreased urine volume, vaginal bleeding, vaginal discharge, enuresis, difficulty urinating, genital sores, vaginal pain, menstrual problem, pelvic pain and dyspareunia.  Musculoskeletal: Positive for arthralgias (right hip and both shoulders). Negative for myalgias, back pain, joint swelling and gait problem.  Skin: Negative for color change, pallor, rash and wound.  Neurological: Negative for dizziness, tremors, focal weakness, seizures, syncope, facial asymmetry, speech difficulty, weakness, light-headedness, numbness and headaches.  Hematological: Positive for polydipsia and polyphagia. Negative for adenopathy. Does not bruise/bleed easily.  Psychiatric/Behavioral: Negative for suicidal ideas, hallucinations, behavioral problems, confusion, sleep disturbance, self-injury, dysphoric mood, decreased concentration and agitation. The patient is nervous/anxious. The patient is not hyperactive.        Objective:   Physical Exam  Vitals reviewed. Constitutional: She is oriented to person, place, and time. She appears well-developed and well-nourished. No distress.  HENT:  Head: Normocephalic and atraumatic.  Mouth/Throat: Oropharynx is clear and moist. No oropharyngeal exudate.  Eyes: Conjunctivae are normal. Right eye exhibits no discharge. Left eye exhibits no discharge. No scleral icterus.  Neck: Normal range of motion. Neck supple. No JVD present. No tracheal deviation present. No thyromegaly present.  Cardiovascular: Normal rate, regular rhythm, normal heart sounds and intact distal pulses.  Exam reveals no gallop and no friction rub.   No murmur heard. Pulmonary/Chest:  Effort normal and breath sounds normal. No stridor. No respiratory distress. She has no wheezes. She has no rales. She exhibits no tenderness.  Abdominal: Soft. Bowel sounds are normal. She exhibits no distension and no mass. There is no tenderness. There is no rebound and no guarding.  Musculoskeletal: Normal range of motion. She exhibits no edema and no tenderness.  Lymphadenopathy:    She has no cervical adenopathy.  Neurological: She is oriented to person, place, and time.  Skin: Skin is warm and dry. No rash noted. She is not diaphoretic. No erythema. No pallor.  Psychiatric: She has a normal mood and affect. Her behavior is normal. Judgment and thought content normal.     Lab Results  Component Value Date   WBC 7.2 05/13/2011   HGB 12.6 05/13/2011   HCT 38.0 05/13/2011   PLT 192.0 05/13/2011   GLUCOSE 228* 05/13/2011   CHOL 169 05/13/2011   TRIG 210.0* 05/13/2011   HDL 59.30 05/13/2011   LDLDIRECT 91.4 05/13/2011   ALT 30 02/04/2011   AST 28 02/04/2011   NA 137 05/13/2011   K 4.6 05/13/2011   CL 104 05/13/2011   CREATININE 1.7* 05/13/2011   BUN 37* 05/13/2011   CO2 21 05/13/2011   TSH 0.50 05/13/2011   HGBA1C 9.7* 05/13/2011       Assessment & Plan:

## 2011-05-14 LAB — BASIC METABOLIC PANEL
CO2: 21 mEq/L (ref 19–32)
Chloride: 104 mEq/L (ref 96–112)
Potassium: 4.6 mEq/L (ref 3.5–5.1)
Sodium: 137 mEq/L (ref 135–145)

## 2011-05-15 LAB — CULTURE, URINE COMPREHENSIVE: Colony Count: NO GROWTH

## 2011-05-16 NOTE — Assessment & Plan Note (Signed)
Her BP is well controlled 

## 2011-05-16 NOTE — Assessment & Plan Note (Signed)
I will check her CPK level to she if she is having any muscle damage

## 2011-05-16 NOTE — Assessment & Plan Note (Signed)
I will monitor her renal function today 

## 2011-05-16 NOTE — Assessment & Plan Note (Signed)
I will recheck her UA and ur clx today to see if a urinary infection is causing her symptoms

## 2011-05-16 NOTE — Assessment & Plan Note (Signed)
I will check her a1c today and will make changes if needed to get better BP control

## 2011-05-16 NOTE — Assessment & Plan Note (Signed)
I will check her uric acid level today 

## 2011-05-16 NOTE — Assessment & Plan Note (Signed)
I will check her labs today to look for renal stones/failure, infection, hepatitis, spleen issues, etc. And I have asked to get a CT scan to look for mass, large spleen, renal stones, etc

## 2011-05-18 ENCOUNTER — Ambulatory Visit (INDEPENDENT_AMBULATORY_CARE_PROVIDER_SITE_OTHER)
Admission: RE | Admit: 2011-05-18 | Discharge: 2011-05-18 | Disposition: A | Discharge status: Home / Self Care | Payer: Medicare Other | Source: Ambulatory Visit | Attending: Internal Medicine | Admitting: Internal Medicine

## 2011-05-18 DIAGNOSIS — N259 Disorder resulting from impaired renal tubular function, unspecified: Secondary | ICD-10-CM

## 2011-05-18 DIAGNOSIS — R110 Nausea: Secondary | ICD-10-CM | POA: Diagnosis not present

## 2011-05-18 DIAGNOSIS — R10812 Left upper quadrant abdominal tenderness: Secondary | ICD-10-CM | POA: Diagnosis not present

## 2011-05-18 DIAGNOSIS — K449 Diaphragmatic hernia without obstruction or gangrene: Secondary | ICD-10-CM | POA: Diagnosis not present

## 2011-05-18 DIAGNOSIS — R109 Unspecified abdominal pain: Secondary | ICD-10-CM | POA: Diagnosis not present

## 2011-05-18 MED ORDER — IOHEXOL 300 MG/ML  SOLN
80.0000 mL | Freq: Once | INTRAMUSCULAR | Status: AC | PRN
  Administered 2011-05-18: 80 mL via INTRAVENOUS

## 2011-05-21 ENCOUNTER — Telehealth: Payer: Self-pay

## 2011-05-21 NOTE — Telephone Encounter (Signed)
Patient notified

## 2011-05-21 NOTE — Telephone Encounter (Signed)
Patient request CT results, please advise

## 2011-05-21 NOTE — Telephone Encounter (Signed)
normal

## 2011-06-15 DIAGNOSIS — N183 Chronic kidney disease, stage 3 unspecified: Secondary | ICD-10-CM | POA: Diagnosis not present

## 2011-06-15 DIAGNOSIS — E119 Type 2 diabetes mellitus without complications: Secondary | ICD-10-CM | POA: Diagnosis not present

## 2011-06-15 DIAGNOSIS — M109 Gout, unspecified: Secondary | ICD-10-CM | POA: Diagnosis not present

## 2011-07-04 ENCOUNTER — Other Ambulatory Visit: Payer: Self-pay | Admitting: Internal Medicine

## 2011-07-06 DIAGNOSIS — I10 Essential (primary) hypertension: Secondary | ICD-10-CM | POA: Diagnosis not present

## 2011-07-06 DIAGNOSIS — M109 Gout, unspecified: Secondary | ICD-10-CM | POA: Diagnosis not present

## 2011-07-06 DIAGNOSIS — E119 Type 2 diabetes mellitus without complications: Secondary | ICD-10-CM | POA: Diagnosis not present

## 2011-07-06 DIAGNOSIS — N183 Chronic kidney disease, stage 3 unspecified: Secondary | ICD-10-CM | POA: Diagnosis not present

## 2011-08-07 ENCOUNTER — Other Ambulatory Visit: Payer: Self-pay | Admitting: Internal Medicine

## 2011-08-16 ENCOUNTER — Other Ambulatory Visit: Payer: Self-pay | Admitting: Internal Medicine

## 2011-10-04 ENCOUNTER — Telehealth: Payer: Self-pay | Admitting: Internal Medicine

## 2011-10-04 NOTE — Telephone Encounter (Signed)
Last seen 01/05/11. No pending appts. Pt c/o dry cough and increased sob with any activity and she is requesting to come in for an ov. CDT does have an opening at 3:456 on Tues., 10/05/11. Pt will seek emergency help if things become any worse.

## 2011-10-05 ENCOUNTER — Ambulatory Visit (INDEPENDENT_AMBULATORY_CARE_PROVIDER_SITE_OTHER): Payer: Medicare Other | Admitting: Internal Medicine

## 2011-10-05 ENCOUNTER — Ambulatory Visit (INDEPENDENT_AMBULATORY_CARE_PROVIDER_SITE_OTHER)
Admission: RE | Admit: 2011-10-05 | Discharge: 2011-10-05 | Disposition: A | Discharge status: Home / Self Care | Payer: Medicare Other | Source: Ambulatory Visit | Attending: Internal Medicine | Admitting: Internal Medicine

## 2011-10-05 ENCOUNTER — Encounter: Payer: Self-pay | Admitting: Internal Medicine

## 2011-10-05 VITALS — BP 130/70 | HR 65 | Ht 63.0 in | Wt 221.0 lb

## 2011-10-05 DIAGNOSIS — J309 Allergic rhinitis, unspecified: Secondary | ICD-10-CM | POA: Diagnosis not present

## 2011-10-05 DIAGNOSIS — R079 Chest pain, unspecified: Secondary | ICD-10-CM | POA: Diagnosis not present

## 2011-10-05 DIAGNOSIS — J45909 Unspecified asthma, uncomplicated: Secondary | ICD-10-CM

## 2011-10-05 DIAGNOSIS — R0602 Shortness of breath: Secondary | ICD-10-CM | POA: Diagnosis not present

## 2011-10-05 DIAGNOSIS — Z23 Encounter for immunization: Secondary | ICD-10-CM | POA: Diagnosis not present

## 2011-10-05 MED ORDER — FLUTICASONE-SALMETEROL 100-50 MCG/DOSE IN AEPB: 1.0000 | INHALATION_SPRAY | Freq: Two times a day (BID) | RESPIRATORY_TRACT | Status: DC

## 2011-10-05 NOTE — Progress Notes (Signed)
10/29/10- Alllergic rhinitis, asthma, hx urticaria, complicated by DM, renal insufficiency Last here- Dec 05, 2009. Had flu shot She continues to stay with her daughter in [place] for months at a time.  Over the last month has had typical for this season, chest tightness and nasal congestion with wheeze. Qvar not sufficient and she ran out of Singulair. While in [place] she saw cardiologist for dyspnea who did stress test. He recommended she get a sleep study.Treating for HBP and renal insufficiency She is aware of snore, but alone. Denies awareness of daytime sleepiness. Takes alprazolam for occasional insomnia.   01/05/11- Alllergic rhinitis, asthma, hx urticaria, complicated by DM, renal insufficiency Husband here Nose became congested overnight but chest ok. Got saline sinus rinse.  Asthma-preferred Qvar to [facility].  Sleep- wakes frequently unless she takes [place]. She doesn't mind the limb jerks. Discussed meds and oxygen for sleep.  10/05/11-  19 yoF former smoker followed for alllergic rhinitis, asthma/ COPD, hx urticaria, complicated by DM, renal insufficiency, GERD           husband here ACUTE VISIT: Increased SOB worse with activity-worse at night with sleeping--wheezing. Has had increase in weight. Dry cough mostly-through out the day has productive cough-white in color. Never got the sleep study scheduled 11/20/10 last year. CT chest 2005 reported emphysema. Over the past 3 months has had more wheezing shortness of breath. These wake her at night and she feels that she fights for her breath. Wheeze with walking. Has lost some weight. Using Vicks VapoRub which helps. Out of Ventolin which also helped. Noticing nasal congestion. Denies fever, purulent discharge or sore throat. Some swelling in her feet she attributes to gout. Cardiologist in [place] told her "nothing wrong with her heart".  ROS- see HPI Constitutional:   No-   weight loss, night sweats, fevers, chills, fatigue,  lassitude. HEENT:   No-  headaches, difficulty swallowing, tooth/dental problems, sore throat,       No-  sneezing, itching, ear ache, +nasal congestion, post nasal drip,  CV:  No-   chest pain, orthopnea, PND, swelling in lower extremities, anasarca, +dizziness, No-palpitations Resp: + shortness of breath with exertion or at rest.              +  productive cough,  No non-productive cough,  No-  coughing up of blood.              No-   change in color of mucus.  No- wheezing.   Skin: No-   rash or lesions. GI:  No-   heartburn, indigestion, abdominal pain, nausea, vomiting,  GU: MS:  +  joint pain or swelling.  . Neuro- nothing unusual Psych:  No- change in mood or affect. No depression or anxiety.  No memory loss.  OBJ General- Alert, Oriented, Affect-seems a little intense or excited, Distress- none acute: Obese     Skin- rash-none, lesions- none, excoriation- none Lymphadenopathy- none Head- atraumatic            Eyes- Gross vision intact, PERRLA, conjunctivae clear secretions            Ears- Hearing, canals-normal            Nose- mild turbinate edema, no-Septal dev, mucus, polyps, erosion, perforation             Throat- Mallampati III , mucosa clear , drainage- none, tonsils- atrophic Neck- flexible , trachea midline, no stridor , thyroid nl, carotid no bruit Chest - symmetrical excursion ,  unlabored           Heart/CV- RRR , no murmur , no gallop  , no rub, nl s1 s2                           - JVD- none , edema mild swelling of feet she blames on "gout", stasis changes- none, varices- none           Lung- clear to P&A, wheeze- none, cough- none , dullness-none, rub- none           Chest wall-  Abd-  Br/ Gen/ Rectal- Not done, not indicated Extrem- cyanosis- none, clubbing, none, atrophy- none, strength- nl. Negative Homans Neuro- grossly intact to observation

## 2011-10-05 NOTE — Patient Instructions (Addendum)
Use rescue albuterol HFA inhaler- like Ventolin or Proventil      2 puffs, up to every 4 hours only if needed for short of breath, chest tightness, wheeze  Sample Advair 100    1 puff then rinse mouth well, twice daily every day    This is maintenance controller for asthma  Order- CXR    Dx asthma              Schedule PFT and 6 MWT   Dx asthma, dyspnea   Flu vax

## 2011-10-06 NOTE — Progress Notes (Signed)
Quick Note:  Pt aware of results. ______ 

## 2011-10-13 NOTE — Assessment & Plan Note (Signed)
This may be seasonal exacerbation. Discussed antihistamines and available nasal sprays.

## 2011-10-13 NOTE — Assessment & Plan Note (Signed)
Plan-schedule chest x-ray, PFT, 6 minute walk test Refill albuterol Sample Advair

## 2011-10-26 ENCOUNTER — Ambulatory Visit (INDEPENDENT_AMBULATORY_CARE_PROVIDER_SITE_OTHER): Payer: Medicare Other | Admitting: Internal Medicine

## 2011-10-26 DIAGNOSIS — J45909 Unspecified asthma, uncomplicated: Secondary | ICD-10-CM

## 2011-10-26 DIAGNOSIS — R0989 Other specified symptoms and signs involving the circulatory and respiratory systems: Secondary | ICD-10-CM

## 2011-10-26 DIAGNOSIS — R0609 Other forms of dyspnea: Secondary | ICD-10-CM

## 2011-10-26 DIAGNOSIS — R0600 Dyspnea, unspecified: Secondary | ICD-10-CM

## 2011-10-26 LAB — PULMONARY FUNCTION TEST

## 2011-10-26 NOTE — Progress Notes (Signed)
PFT done today. 

## 2011-10-29 ENCOUNTER — Ambulatory Visit (INDEPENDENT_AMBULATORY_CARE_PROVIDER_SITE_OTHER): Payer: Medicare Other | Admitting: Internal Medicine

## 2011-10-29 ENCOUNTER — Encounter: Payer: Self-pay | Admitting: Internal Medicine

## 2011-10-29 VITALS — BP 130/64 | HR 65 | Temp 97.7°F | Resp 16 | Wt 220.5 lb

## 2011-10-29 DIAGNOSIS — J309 Allergic rhinitis, unspecified: Secondary | ICD-10-CM

## 2011-10-29 DIAGNOSIS — I10 Essential (primary) hypertension: Secondary | ICD-10-CM

## 2011-10-29 DIAGNOSIS — E119 Type 2 diabetes mellitus without complications: Secondary | ICD-10-CM | POA: Diagnosis not present

## 2011-10-29 DIAGNOSIS — J45909 Unspecified asthma, uncomplicated: Secondary | ICD-10-CM | POA: Diagnosis not present

## 2011-10-29 DIAGNOSIS — N259 Disorder resulting from impaired renal tubular function, unspecified: Secondary | ICD-10-CM | POA: Diagnosis not present

## 2011-10-29 DIAGNOSIS — E785 Hyperlipidemia, unspecified: Secondary | ICD-10-CM

## 2011-10-29 MED ORDER — MOMETASONE FUROATE 50 MCG/ACT NA SUSP: 2.0000 | Freq: Every day | NASAL | Status: DC

## 2011-10-29 MED ORDER — FLUTICASONE-SALMETEROL 100-50 MCG/DOSE IN AEPB: 1.0000 | INHALATION_SPRAY | Freq: Two times a day (BID) | RESPIRATORY_TRACT | Status: DC

## 2011-10-29 MED ORDER — ZOLPIDEM TARTRATE 5 MG PO TABS: 5.0000 mg | ORAL_TABLET | Freq: Every evening | ORAL | Status: DC | PRN

## 2011-10-29 NOTE — Assessment & Plan Note (Signed)
FLP today 

## 2011-10-29 NOTE — Progress Notes (Signed)
Subjective:    Patient ID: Megan [name], female    DOB: Nov 02, 1940, 71 y.o.   MRN: [id number]  Diabetes She presents for her follow-up diabetic visit. She has type 2 diabetes mellitus. Her disease course has been stable. Hypoglycemia symptoms include nervousness/anxiousness. Pertinent negatives for hypoglycemia include no confusion, dizziness or speech difficulty. Pertinent negatives for diabetes include no blurred vision, no chest pain, no fatigue, no foot paresthesias, no foot ulcerations, no polydipsia, no polyphagia, no polyuria, no visual change, no weakness and no weight loss. There are no hypoglycemic complications. Diabetic complications include a CVA. Current diabetic treatment includes oral agent (triple therapy). She is compliant with treatment all of the time. Her weight is stable. She is following a generally healthy diet. Meal planning includes avoidance of concentrated sweets. She never participates in exercise. There is no change in her home blood glucose trend. An ACE inhibitor/angiotensin II receptor blocker is being taken. She does not see a podiatrist.Eye exam is current.      Review of Systems  Constitutional: Negative for fever, chills, weight loss, diaphoresis, activity change, appetite change, fatigue and unexpected weight change.  HENT: Negative.   Eyes: Negative.  Negative for blurred vision.  Respiratory: Negative for cough, choking, chest tightness, shortness of breath, wheezing and stridor.   Cardiovascular: Negative for chest pain, palpitations and leg swelling.  Gastrointestinal: Negative for nausea, vomiting, abdominal pain, diarrhea, constipation and blood in stool.  Genitourinary: Negative.  Negative for polyuria.  Musculoskeletal: Negative.   Skin: Negative.   Neurological: Negative.  Negative for dizziness, syncope, speech difficulty, weakness and light-headedness.  Hematological: Negative for polydipsia, polyphagia and adenopathy. Does not bruise/bleed  easily.  Psychiatric/Behavioral: Positive for disturbed wake/sleep cycle and dysphoric mood. Negative for suicidal ideas, hallucinations, behavioral problems, confusion, self-injury, decreased concentration and agitation. The patient is nervous/anxious. The patient is not hyperactive.        Objective:   Physical Exam  Vitals reviewed. Constitutional: She is oriented to person, place, and time. She appears well-developed and well-nourished. No distress.  HENT:  Head: Normocephalic and atraumatic.  Mouth/Throat: Oropharynx is clear and moist. No oropharyngeal exudate.  Eyes: Conjunctivae normal are normal. Right eye exhibits no discharge. Left eye exhibits no discharge. No scleral icterus.  Neck: Normal range of motion. Neck supple. No JVD present. No tracheal deviation present. No thyromegaly present.  Cardiovascular: Normal rate, regular rhythm, normal heart sounds and intact distal pulses.  Exam reveals no gallop and no friction rub.   No murmur heard. Pulmonary/Chest: Effort normal and breath sounds normal. No stridor. No respiratory distress. She has no wheezes. She has no rales. She exhibits no tenderness.  Abdominal: Soft. Bowel sounds are normal. She exhibits no distension and no mass. There is no tenderness. There is no rebound and no guarding.  Musculoskeletal: Normal range of motion. She exhibits no edema and no tenderness.  Lymphadenopathy:    She has no cervical adenopathy.  Neurological: She is oriented to person, place, and time.  Skin: Skin is warm and dry. No rash noted. She is not diaphoretic. No erythema. No pallor.  Psychiatric: She has a normal mood and affect. Her speech is normal and behavior is normal. Judgment and thought content normal. Her mood appears not anxious. Her affect is not angry, not blunt, not labile and not inappropriate. Cognition and memory are normal. She does not exhibit a depressed mood.     Lab Results  Component Value Date   WBC 7.2 05/13/2011  HGB 12.6 05/13/2011   HCT 38.0 05/13/2011   PLT 192.0 05/13/2011   GLUCOSE 228* 05/13/2011   CHOL 169 05/13/2011   TRIG 210.0* 05/13/2011   HDL 59.30 05/13/2011   LDLDIRECT 91.4 05/13/2011   ALT 30 02/04/2011   AST 28 02/04/2011   NA 137 05/13/2011   K 4.6 05/13/2011   CL 104 05/13/2011   CREATININE 1.7* 05/13/2011   BUN 37* 05/13/2011   CO2 21 05/13/2011   TSH 0.50 05/13/2011   HGBA1C 9.7* 05/13/2011       Assessment & Plan:

## 2011-10-29 NOTE — Assessment & Plan Note (Signed)
I will recheck her renal function today. °

## 2011-10-29 NOTE — Patient Instructions (Signed)

## 2011-10-29 NOTE — Assessment & Plan Note (Signed)
I will check her a1c today 

## 2011-10-29 NOTE — Assessment & Plan Note (Signed)
Her BP is well controlled, I will check her lytes and renal function 

## 2011-11-01 NOTE — Progress Notes (Signed)
Documentation for 6 minute walk test 

## 2011-11-01 NOTE — Patient Instructions (Addendum)
For documentation of 6 minute walk test

## 2011-11-02 ENCOUNTER — Other Ambulatory Visit: Payer: Self-pay | Admitting: Internal Medicine

## 2011-12-08 DIAGNOSIS — R300 Dysuria: Secondary | ICD-10-CM | POA: Diagnosis not present

## 2011-12-08 DIAGNOSIS — N951 Menopausal and female climacteric states: Secondary | ICD-10-CM | POA: Diagnosis not present

## 2011-12-08 DIAGNOSIS — Z1231 Encounter for screening mammogram for malignant neoplasm of breast: Secondary | ICD-10-CM | POA: Diagnosis not present

## 2011-12-08 DIAGNOSIS — Z01419 Encounter for gynecological examination (general) (routine) without abnormal findings: Secondary | ICD-10-CM | POA: Diagnosis not present

## 2011-12-08 DIAGNOSIS — Z124 Encounter for screening for malignant neoplasm of cervix: Secondary | ICD-10-CM | POA: Diagnosis not present

## 2011-12-08 LAB — HM PAP SMEAR: HM Pap smear: NORMAL

## 2011-12-09 DIAGNOSIS — H2510 Age-related nuclear cataract, unspecified eye: Secondary | ICD-10-CM | POA: Diagnosis not present

## 2011-12-09 DIAGNOSIS — H35039 Hypertensive retinopathy, unspecified eye: Secondary | ICD-10-CM | POA: Diagnosis not present

## 2011-12-09 DIAGNOSIS — H353190 Nonexudative age-related macular degeneration, unspecified eye, stage unspecified: Secondary | ICD-10-CM | POA: Diagnosis not present

## 2011-12-09 DIAGNOSIS — H04129 Dry eye syndrome of unspecified lacrimal gland: Secondary | ICD-10-CM | POA: Diagnosis not present

## 2011-12-09 DIAGNOSIS — H5200 Hypermetropia, unspecified eye: Secondary | ICD-10-CM | POA: Diagnosis not present

## 2011-12-13 ENCOUNTER — Encounter: Payer: Self-pay | Admitting: Internal Medicine

## 2011-12-13 ENCOUNTER — Ambulatory Visit (INDEPENDENT_AMBULATORY_CARE_PROVIDER_SITE_OTHER): Payer: Medicare Other | Admitting: Internal Medicine

## 2011-12-13 VITALS — BP 150/62 | HR 56 | Temp 96.6°F | Resp 22 | Ht 63.0 in | Wt 224.8 lb

## 2011-12-13 DIAGNOSIS — E1129 Type 2 diabetes mellitus with other diabetic kidney complication: Secondary | ICD-10-CM | POA: Diagnosis not present

## 2011-12-13 DIAGNOSIS — I10 Essential (primary) hypertension: Secondary | ICD-10-CM

## 2011-12-13 DIAGNOSIS — N259 Disorder resulting from impaired renal tubular function, unspecified: Secondary | ICD-10-CM

## 2011-12-13 DIAGNOSIS — K219 Gastro-esophageal reflux disease without esophagitis: Secondary | ICD-10-CM | POA: Diagnosis not present

## 2011-12-13 LAB — HM DIABETES FOOT EXAM: HM Diabetic Foot Exam: NORMAL

## 2011-12-13 MED ORDER — CANAGLIFLOZIN 100 MG PO TABS: 1.0000 | ORAL_TABLET | Freq: Every day | ORAL | Status: DC

## 2011-12-13 MED ORDER — GLUCOSE BLOOD VI STRP: ORAL_STRIP | Status: DC

## 2011-12-13 NOTE — Assessment & Plan Note (Signed)
She is doing well on the PPI

## 2011-12-13 NOTE — Assessment & Plan Note (Signed)
She has adequate BP control, today I will check her lytes and renal function

## 2011-12-13 NOTE — Assessment & Plan Note (Signed)
I will recheck her renal function today and have asked her to see nephrology

## 2011-12-13 NOTE — Progress Notes (Signed)
Subjective:    Patient ID: Megan [name], female    DOB: 25-Feb-1940, 71 y.o.   MRN: [id number]  Diabetes She presents for her follow-up diabetic visit. She has type 2 diabetes mellitus. Her disease course has been stable. There are no hypoglycemic associated symptoms. Pertinent negatives for hypoglycemia include no headaches, pallor or sweats. Associated symptoms include polydipsia, polyphagia and polyuria. Pertinent negatives for diabetes include no blurred vision, no chest pain, no fatigue, no foot paresthesias, no foot ulcerations, no visual change, no weakness and no weight loss. There are no hypoglycemic complications. Symptoms are stable. Diabetic complications include a CVA and nephropathy. Current diabetic treatment includes intensive insulin program, insulin injections and oral agent (dual therapy). She is compliant with treatment most of the time. Her weight is increasing steadily. She is following a generally unhealthy diet. When asked about meal planning, she reported none. She never participates in exercise. There is no change in her home blood glucose trend. An ACE inhibitor/angiotensin II receptor blocker is being taken. She does not see a podiatrist.Eye exam is current.  Hypertension This is a chronic problem. The current episode started more than 1 year ago. The problem is unchanged. The problem is controlled. Associated symptoms include peripheral edema. Pertinent negatives include no anxiety, blurred vision, chest pain, headaches, malaise/fatigue, neck pain, orthopnea, palpitations, PND, shortness of breath or sweats. Past treatments include calcium channel blockers, beta blockers, diuretics and angiotensin blockers. The current treatment provides moderate improvement. Compliance problems include exercise and diet.  Hypertensive end-organ damage includes CVA.      Review of Systems  Constitutional: Negative for fever, chills, weight loss, malaise/fatigue, diaphoresis, activity  change, appetite change, fatigue and unexpected weight change.  HENT: Negative.  Negative for neck pain.   Eyes: Negative.  Negative for blurred vision.  Respiratory: Negative for apnea, cough, choking, chest tightness, shortness of breath and stridor.   Cardiovascular: Negative for chest pain, palpitations, orthopnea, leg swelling and PND.  Gastrointestinal: Negative for nausea, vomiting, abdominal pain, diarrhea, constipation and anal bleeding.  Genitourinary: Positive for polyuria.  Musculoskeletal: Negative.  Negative for myalgias, back pain, joint swelling, arthralgias and gait problem.  Skin: Negative for color change, pallor, rash and wound.  Neurological: Negative.  Negative for weakness and headaches.  Hematological: Positive for polydipsia and polyphagia. Negative for adenopathy. Does not bruise/bleed easily.  Psychiatric/Behavioral: Negative.        Objective:   Physical Exam  Vitals reviewed. Constitutional: She is oriented to person, place, and time. She appears well-developed and well-nourished. No distress.  HENT:  Head: Normocephalic and atraumatic.  Mouth/Throat: Oropharynx is clear and moist. No oropharyngeal exudate.  Eyes: Conjunctivae normal are normal. Right eye exhibits no discharge. Left eye exhibits no discharge. No scleral icterus.  Neck: Normal range of motion. Neck supple. No JVD present. No tracheal deviation present. No thyromegaly present.  Cardiovascular: Normal rate, regular rhythm, normal heart sounds and intact distal pulses.  Exam reveals no gallop and no friction rub.   No murmur heard. Pulmonary/Chest: Effort normal and breath sounds normal. No stridor. No respiratory distress. She has no wheezes. She has no rales. She exhibits no tenderness.  Abdominal: Soft. Bowel sounds are normal. She exhibits no distension and no mass. There is no tenderness. There is no rebound and no guarding.  Musculoskeletal: Normal range of motion. She exhibits edema (trace  edema in BLE). She exhibits no tenderness.  Lymphadenopathy:    She has no cervical adenopathy.  Neurological: She is oriented to  person, place, and time.  Skin: Skin is warm and dry. No rash noted. She is not diaphoretic. No erythema. No pallor.  Psychiatric: She has a normal mood and affect. Her behavior is normal. Judgment and thought content normal.      Lab Results  Component Value Date   WBC 7.2 05/13/2011   HGB 12.6 05/13/2011   HCT 38.0 05/13/2011   PLT 192.0 05/13/2011   GLUCOSE 228* 05/13/2011   CHOL 169 05/13/2011   TRIG 210.0* 05/13/2011   HDL 59.30 05/13/2011   LDLDIRECT 91.4 05/13/2011   ALT 30 02/04/2011   AST 28 02/04/2011   NA 137 05/13/2011   K 4.6 05/13/2011   CL 104 05/13/2011   CREATININE 1.7* 05/13/2011   BUN 37* 05/13/2011   CO2 21 05/13/2011   TSH 0.50 05/13/2011   HGBA1C 9.7* 05/13/2011      Assessment & Plan:

## 2011-12-13 NOTE — Assessment & Plan Note (Signed)
I am concerned that her weight gain may be caused by the SU so I have asked her to stop glimeperide Will start invokana for better BS control I will check her a1c and renal function today

## 2011-12-13 NOTE — Patient Instructions (Signed)

## 2011-12-14 DIAGNOSIS — E1169 Type 2 diabetes mellitus with other specified complication: Secondary | ICD-10-CM | POA: Insufficient documentation

## 2011-12-15 DIAGNOSIS — F4322 Adjustment disorder with anxiety: Secondary | ICD-10-CM | POA: Diagnosis not present

## 2011-12-15 DIAGNOSIS — E7800 Pure hypercholesterolemia, unspecified: Secondary | ICD-10-CM | POA: Diagnosis not present

## 2011-12-15 DIAGNOSIS — I10 Essential (primary) hypertension: Secondary | ICD-10-CM | POA: Diagnosis not present

## 2011-12-15 DIAGNOSIS — E119 Type 2 diabetes mellitus without complications: Secondary | ICD-10-CM | POA: Diagnosis not present

## 2011-12-19 DIAGNOSIS — Z9889 Other specified postprocedural states: Secondary | ICD-10-CM | POA: Insufficient documentation

## 2012-01-04 ENCOUNTER — Ambulatory Visit: Payer: Medicare Other | Admitting: Internal Medicine

## 2012-01-20 DIAGNOSIS — E7800 Pure hypercholesterolemia, unspecified: Secondary | ICD-10-CM | POA: Diagnosis not present

## 2012-01-20 DIAGNOSIS — I10 Essential (primary) hypertension: Secondary | ICD-10-CM | POA: Diagnosis not present

## 2012-01-20 DIAGNOSIS — E669 Obesity, unspecified: Secondary | ICD-10-CM | POA: Diagnosis not present

## 2012-01-20 DIAGNOSIS — E119 Type 2 diabetes mellitus without complications: Secondary | ICD-10-CM | POA: Diagnosis not present

## 2012-01-21 ENCOUNTER — Ambulatory Visit: Payer: Medicare Other | Admitting: Internal Medicine

## 2012-02-03 ENCOUNTER — Ambulatory Visit (INDEPENDENT_AMBULATORY_CARE_PROVIDER_SITE_OTHER): Payer: Medicare Other | Admitting: Internal Medicine

## 2012-02-03 ENCOUNTER — Encounter: Payer: Self-pay | Admitting: Internal Medicine

## 2012-02-03 ENCOUNTER — Other Ambulatory Visit (INDEPENDENT_AMBULATORY_CARE_PROVIDER_SITE_OTHER): Payer: Medicare Other

## 2012-02-03 VITALS — BP 138/82 | HR 70 | Temp 98.6°F | Resp 16 | Ht 63.0 in | Wt 222.0 lb

## 2012-02-03 DIAGNOSIS — N259 Disorder resulting from impaired renal tubular function, unspecified: Secondary | ICD-10-CM | POA: Diagnosis not present

## 2012-02-03 DIAGNOSIS — I129 Hypertensive chronic kidney disease with stage 1 through stage 4 chronic kidney disease, or unspecified chronic kidney disease: Secondary | ICD-10-CM | POA: Diagnosis not present

## 2012-02-03 DIAGNOSIS — N390 Urinary tract infection, site not specified: Secondary | ICD-10-CM | POA: Diagnosis not present

## 2012-02-03 DIAGNOSIS — E1165 Type 2 diabetes mellitus with hyperglycemia: Secondary | ICD-10-CM

## 2012-02-03 DIAGNOSIS — N189 Chronic kidney disease, unspecified: Secondary | ICD-10-CM | POA: Diagnosis not present

## 2012-02-03 DIAGNOSIS — I10 Essential (primary) hypertension: Secondary | ICD-10-CM

## 2012-02-03 DIAGNOSIS — E119 Type 2 diabetes mellitus without complications: Secondary | ICD-10-CM | POA: Diagnosis not present

## 2012-02-03 DIAGNOSIS — E1129 Type 2 diabetes mellitus with other diabetic kidney complication: Secondary | ICD-10-CM | POA: Diagnosis not present

## 2012-02-03 DIAGNOSIS — E785 Hyperlipidemia, unspecified: Secondary | ICD-10-CM

## 2012-02-03 LAB — CBC WITH DIFFERENTIAL/PLATELET
Basophils Relative: 0.5 % (ref 0.0–3.0)
Eosinophils Relative: 1.9 % (ref 0.0–5.0)
MCV: 86.2 fl (ref 78.0–100.0)
Monocytes Absolute: 1 10*3/uL (ref 0.1–1.0)
Monocytes Relative: 9.6 % (ref 3.0–12.0)
Neutrophils Relative %: 61.3 % (ref 43.0–77.0)
Platelets: 246 10*3/uL (ref 150.0–400.0)
RBC: 4.31 Mil/uL (ref 3.87–5.11)
WBC: 10.8 10*3/uL — ABNORMAL HIGH (ref 4.5–10.5)

## 2012-02-03 LAB — URINALYSIS, ROUTINE W REFLEX MICROSCOPIC
Hgb urine dipstick: NEGATIVE
Leukocytes, UA: NEGATIVE
Specific Gravity, Urine: 1.02 (ref 1.000–1.030)
Urobilinogen, UA: 0.2 (ref 0.0–1.0)

## 2012-02-03 LAB — COMPREHENSIVE METABOLIC PANEL
Albumin: 3.6 g/dL (ref 3.5–5.2)
Alkaline Phosphatase: 95 U/L (ref 39–117)
BUN: 28 mg/dL — ABNORMAL HIGH (ref 6–23)
CO2: 22 mEq/L (ref 19–32)
GFR: 28.43 mL/min — ABNORMAL LOW (ref >60.00)
Glucose, Bld: 202 mg/dL — ABNORMAL HIGH (ref 70–99)
Potassium: 4.5 mEq/L (ref 3.5–5.1)
Total Bilirubin: 0.6 mg/dL (ref 0.3–1.2)

## 2012-02-03 LAB — HEMOGLOBIN A1C: Hgb A1c MFr Bld: 8.9 % — ABNORMAL HIGH (ref 4.6–6.5)

## 2012-02-03 NOTE — Patient Instructions (Signed)

## 2012-02-03 NOTE — Progress Notes (Signed)
Subjective:    Patient ID: Megan [name], female    DOB: 01-Jun-1940, 72 y.o.   MRN: [id number]  Diabetes She presents for her follow-up diabetic visit. She has type 2 diabetes mellitus. Her disease course has been stable. There are no hypoglycemic associated symptoms. Pertinent negatives for hypoglycemia include no dizziness, headaches, pallor, seizures, speech difficulty or tremors. Associated symptoms include polydipsia, polyphagia and polyuria. Pertinent negatives for diabetes include no blurred vision, no chest pain, no fatigue, no foot paresthesias, no foot ulcerations, no visual change, no weakness and no weight loss. There are no hypoglycemic complications. There are no diabetic complications. Current diabetic treatment includes insulin injections and oral agent (dual therapy). She is compliant with treatment most of the time. Her weight is stable. She is following a generally healthy diet. When asked about meal planning, she reported none. She never participates in exercise. There is no change in her home blood glucose trend. An ACE inhibitor/angiotensin II receptor blocker is being taken. She does not see a podiatrist.Eye exam is current.      Review of Systems  Constitutional: Negative.  Negative for fever, chills, weight loss, diaphoresis, appetite change, fatigue and unexpected weight change.  HENT: Negative.   Eyes: Negative.  Negative for blurred vision.  Respiratory: Negative for apnea, cough, choking, chest tightness, shortness of breath, wheezing and stridor.   Cardiovascular: Negative for chest pain, palpitations and leg swelling.  Gastrointestinal: Positive for constipation. Negative for nausea, vomiting, abdominal pain, diarrhea, blood in stool, abdominal distention, anal bleeding and rectal pain.  Genitourinary: Positive for dysuria, polyuria and frequency. Negative for flank pain, enuresis, difficulty urinating and dyspareunia.  Musculoskeletal: Negative for myalgias, back  pain, joint swelling, arthralgias and gait problem.  Skin: Negative for color change, pallor, rash and wound.  Neurological: Negative for dizziness, tremors, seizures, syncope, facial asymmetry, speech difficulty, weakness, light-headedness, numbness and headaches.  Hematological: Positive for polydipsia and polyphagia. Negative for adenopathy. Does not bruise/bleed easily.  Psychiatric/Behavioral: Negative.        Objective:   Physical Exam  Vitals reviewed. Constitutional: She is oriented to person, place, and time. She appears well-developed and well-nourished. No distress.  HENT:  Head: Normocephalic and atraumatic.  Mouth/Throat: Oropharynx is clear and moist. No oropharyngeal exudate.  Eyes: Conjunctivae normal are normal. Right eye exhibits no discharge. Left eye exhibits no discharge. No scleral icterus.  Neck: Normal range of motion. Neck supple. No JVD present. No tracheal deviation present. No thyromegaly present.  Cardiovascular: Normal rate, regular rhythm, normal heart sounds and intact distal pulses.  Exam reveals no gallop and no friction rub.   No murmur heard. Pulmonary/Chest: Effort normal and breath sounds normal. No stridor. No respiratory distress. She has no wheezes. She has no rales. She exhibits no tenderness.  Abdominal: Soft. Bowel sounds are normal. She exhibits no distension and no mass. There is no tenderness. There is no rebound and no guarding.  Musculoskeletal: Normal range of motion. She exhibits edema (trace pitting edema in BLE). She exhibits no tenderness.  Lymphadenopathy:    She has no cervical adenopathy.  Neurological: She is oriented to person, place, and time.  Skin: Skin is warm and dry. No rash noted. She is not diaphoretic. No erythema. No pallor.  Psychiatric: She has a normal mood and affect. Her behavior is normal. Judgment and thought content normal.      Lab Results  Component Value Date   WBC 7.2 05/13/2011   HGB 12.6 05/13/2011    HCT  38.0 05/13/2011   PLT 192.0 05/13/2011   GLUCOSE 228* 05/13/2011   CHOL 169 05/13/2011   TRIG 210.0* 05/13/2011   HDL 59.30 05/13/2011   LDLDIRECT 91.4 05/13/2011   ALT 30 02/04/2011   AST 28 02/04/2011   NA 137 05/13/2011   K 4.6 05/13/2011   CL 104 05/13/2011   CREATININE 1.7* 05/13/2011   BUN 37* 05/13/2011   CO2 21 05/13/2011   TSH 0.50 05/13/2011   HGBA1C 9.7* 05/13/2011      Assessment & Plan:

## 2012-02-04 ENCOUNTER — Encounter: Payer: Self-pay | Admitting: Internal Medicine

## 2012-02-04 NOTE — Assessment & Plan Note (Addendum)
She will stop welchol due to the constipation but will continue the other meds I will recheck her a1c today

## 2012-02-04 NOTE — Assessment & Plan Note (Signed)
I will recheck her UA and urine culture 

## 2012-02-04 NOTE — Assessment & Plan Note (Signed)
Her BP is well controlled I will recheck her lytes and renal function today 

## 2012-02-04 NOTE — Assessment & Plan Note (Signed)
She is doing well on crestor 

## 2012-02-05 LAB — CULTURE, URINE COMPREHENSIVE: Colony Count: NO GROWTH

## 2012-02-08 ENCOUNTER — Ambulatory Visit (INDEPENDENT_AMBULATORY_CARE_PROVIDER_SITE_OTHER): Payer: Medicare Other | Admitting: Internal Medicine

## 2012-02-08 VITALS — BP 154/68 | HR 70 | Temp 97.4°F | Resp 16 | Ht 63.5 in | Wt 220.0 lb

## 2012-02-08 DIAGNOSIS — R7989 Other specified abnormal findings of blood chemistry: Secondary | ICD-10-CM

## 2012-02-08 DIAGNOSIS — E1165 Type 2 diabetes mellitus with hyperglycemia: Secondary | ICD-10-CM

## 2012-02-08 DIAGNOSIS — R946 Abnormal results of thyroid function studies: Secondary | ICD-10-CM

## 2012-02-08 DIAGNOSIS — E1129 Type 2 diabetes mellitus with other diabetic kidney complication: Secondary | ICD-10-CM

## 2012-02-08 LAB — TSH: TSH: 0.15 u[IU]/mL — ABNORMAL LOW (ref 0.35–5.50)

## 2012-02-08 LAB — MICROALBUMIN / CREATININE URINE RATIO
Creatinine,U: 86.5 mg/dL
Microalb Creat Ratio: 13.6 mg/g (ref 0.0–30.0)
Microalb, Ur: 11.8 mg/dL — ABNORMAL HIGH (ref 0.0–1.9)

## 2012-02-08 LAB — T4, FREE: Free T4: 0.89 ng/dL (ref 0.60–1.60)

## 2012-02-08 MED ORDER — INSULIN GLARGINE 100 UNIT/ML ~~LOC~~ SOLN: 25.0000 [IU] | Freq: Two times a day (BID) | SUBCUTANEOUS | Status: DC

## 2012-02-08 NOTE — Patient Instructions (Addendum)
Please start 2000 units vitamin D.  Please split your Lantus 25 units every 12 hours. You can stop the Welchol. Please bring your sugar log at next appointment. Check your sugars twice a day as we discussed. You can take 10 mg Melatonin for insomnia.   Dietary sources of calcium and vitamin D:  Calcium content (mg) - http://www.niams.MoviePins.co.za  Fortified oatmeal, 1 packet 350  Sardines, canned in oil, with edible bones, 3 oz. 324  Cheddar cheese, 1 oz. shredded 306  Milk, nonfat, 1 cup 302  Milkshake, 1 cup 300  Yogurt, plain, low-fat, 1 cup 300  Soybeans, cooked, 1 cup 261  Tofu, firm, with calcium,  cup 204  Orange juice, fortified with calcium, 6 oz. 200-260 (varies)  Salmon, canned, with edible bones, 3 oz. 181  Pudding, instant, made with 2% milk,  cup 153  Baked beans, 1 cup [place], 1% milk fat, 1 cup 138  Spaghetti, lasagna, 1 cup 125  Frozen yogurt, vanilla, soft-serve,  cup 103  Ready-to-eat cereal, fortified with calcium, 1 cup 100-1,000 (varies)  Cheese pizza, 1 slice 123XX123  Fortified waffles, 2 100  Turnip greens, boiled,  cup 99  Broccoli, raw, 1 cup 90  Ice cream, vanilla,  cup 85  Soy or rice milk, fortified with calcium, 1 cup 80-500 (varies)   Vitamin D content (International Units, IU) - https://www.ars.usda.gov Cod liver oil, 1 tablespoon 1,360  Swordfish, cooked, 3 oz 566  Salmon (sockeye), cooked, 3 oz 447  Tuna fish, canned in water, drained, 3 oz 154  Orange juice fortified with vitamin D, 1 cup (check product labels, as amount of added vitamin D varies) 137  Milk, nonfat, reduced fat, and whole, vitamin D-fortified, 1 cup 115-124  Yogurt, fortified with 20% of the daily value for vitamin D, 6 oz 80  Margarine, fortified, 1 tablespoon 60  Sardines, canned in oil, drained, 2 sardines 46  Liver, beef, cooked, 3 oz 42  Egg, 1 large (vitamin D is found in yolk) 41  Ready-to-eat cereal, fortified with 10% of  the daily value for vitamin D, 0.75-1 cup  40  Cheese, Swiss, 1 oz 6   Some suggestion for healthy eating: - substitute whole grain for white bread or pasta - substitute brown rice for white rice - substitute 90-calorie flat bread pieces for slices of bread when possible - substitute sweet potatoes or yams for white potatoes - substitute humus for margarine - substitute tofu for cheese when possible - substitute almond or rice milk for regular milk (would not drink soy milk daily due to concern for soy estrogen influence on breast cancer risk) - substitute dark chocolate for other sweets when possible - substitute water - can add lemon or orange slices for taste - for diet sodas (artificial sweeteners will trick your body that you can eat sweets without getting calories and will lead you to overeating and weight gain in the long run) - do not skip breakfast or other meals (this will slow down the metabolism and will result in more weight gain over time)  - can try smoothies made from fruit and almond/rice milk in am instead of regular breakfast - can also try old-fashioned (not instant) oatmeal made with almond/rice milk in am - order the dressing on the side when eating salad at a restaurant - eat as little meat as possible - can try juicing, but should not forget that juicing will get rid of the fiber, so would alternate with eating  raw veg./fruits or drinking smoothies - use as little oil as possible, even when using olive oil - can dress a salad with a mix of balsamic vinegar and lemon juice, for e.g. - use agave nectar, stevia sugar, or regular sugar rather than artificial sweateners - steam or broil/roast veggies  - snack on veggies/fruit/nuts (unsalted, preferably) when possible, rather than processed foods - reduce or eliminate aspartame in diet (it is in diet sodas, chewing gum, etc) Read the labels!  Please consider a plant-based diet.  To help you with this, you can start by  watching/reading the following: - lectures (you tube):  Alyssa Grove: "Breaking the Food Seduction"  Doug Lisle: "How to Lose Weight, without Losing Your Mind"  Shari Heritage: "What is Insulin Resistance" https://www.woods-mathews.com/ - documentaries:  [place] [employer], Sick and Nearly Dead  [employer] - books:  Alyssa Grove: "Program for Reversing Diabetes"  Heath Gold: "The [place] Study"  Norma Fredrickson: "Supermarket Vegan" (cookbook)

## 2012-02-08 NOTE — Progress Notes (Signed)
Subjective:     Patient ID: Megan [name], female   DOB: Aug 22, 1940, 72 y.o.   MRN: [id number]  HPI Megan [name] is a pleasant 72 year old woman referred by her PCP, Dr. Ronnald Ramp, for management of DM2, insulin-dependent, uncontrolled, with complications (chronic kidney disease).  She had DM2 since 1995. B/c good nutrition, it was well controlled until 1998. She then started to gain weight through overeating because of stress. 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 She had a stroke in 2004 (no residual deficits now). She started medication tx for DM right after this (Glipizide). She was also on Metformin in the past. She started insulin in 2013.   Latest HbA1C: Lab Results  Component Value Date   HGBA1C 8.9* 02/03/2012   She takes: - 50 units Lantus qhs - Januvia 100 mg - Invokana 100 mg >> started recently, prev. on Glipizide She was on Metformin and that helped tremendously - but she has CKD and she was taken off Metformin. She c/o gaining weight in her abdomen mainly, so that in impedes her breathing (PFTs normal - Dr. Annamaria Boots). She has a Physiological scientist (with which she exercises 3x a week) and a nutritionist at [employer] ([name] - Integrative medicine); will also have a health psychologist.   She does not check sugars out of frustration, but before, she was checking 2-3 x a day: - am: 160 - hs: 240 No lows, has hypoglycemia awareness.   No neuropathy. Last eye exam 2 mo ago - [name]. 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 She had the flu vaccine this season.   She has been told she has Hypothyroidism in the past and was told to increase the iodine intake. Now she was recently found to have a low TSH. She is not on thyroid medication. She has a FH of Graves ds in 2 aunts and a cousin. She has bilat carpal tunnel.  She complains of mid chest pain when she takes a deep breath in and she also feels  shortness of breath. She was seen by a Dr. Annamaria Boots with pulmonary medicine, and PFTs were not abnormal. She also has GERD for which she takes Protonix. She has gout, for which he takes Colcrys when needed.  Review of Systems Constitutional: + weight gain, + fatigue, hot flushes, poor sleep, nocturia x 3-4, burning with urination (recent U/A only showing Glu - expected with Invokana)  Eyes: no blurry vision, no xerophthalmia ENT: no sore throat, no nodules palpated in throat, occas. Dysphagia/ no odynophagia, + hoarseness Cardiovascular: no CP - but pinpoints to mid-chest where she feels discomfort when takes a deep breath/+ SOB/palpitations/leg swelling Respiratory: + cough/+ SOB/+ wheezing Gastrointestinal: + heartburn, + N/no V/+ C Musculoskeletal: + muscle/joint aches Skin: easy bruising - had a bruise from insulin inj., now resolved; hair loss, itching Neurological: no tremors/numbness/tingling/dizziness Psychiatric: no depression/anxiety Low libido Past Medical History  Diagnosis Date  . History of CVA (cerebrovascular accident) 2004  . Loss of smell 2004    due to CVA  . Gastritis   . Umbilical hernia   . Esophageal stricture   . History of chickenpox   . Allergic rhinitis   . Hypertension   . Gout   . GERD (gastroesophageal reflux disease)   . Diverticulosis of colon   . Diabetes mellitus type II   . Colon polyp, hyperplastic   . Asthma   . Renal insufficiency   .  Hyperlipidemia    Past Surgical History  Procedure Date  . Appendectomy   . Vesicovaginal fistula closure w/ tah   . Hemorroidectomy   . Cesarean section     x 2  . Abdominal hysterectomy    History   Social History  . Marital Status: Married    Spouse Name: N/A    Number of Children: 2   Occupational History  . Retired Pharmacist, hospital    Social History Main Topics  . Smoking status: Former Research scientist (life sciences)  . Smokeless tobacco: Never Used     Comment: Quit smoking 2004  . Alcohol Use: No  . Drug Use: No  .  Sexually Active: No   Other Topics Concern  . Not on file   Social History Narrative  . Former Art therapist   Current Outpatient Prescriptions on File Prior to Visit  Medication Sig Dispense Refill  . albuterol (PROVENTIL HFA;VENTOLIN HFA) 108 (90 BASE) MCG/ACT inhaler Inhale 2 puffs into the lungs every 4 (four) hours as needed.      Marland Kitchen BENICAR HCT 40-25 MG per tablet TAKE 1 TABLET BY MOUTH ONCE DAILY FOR HIGH BLOOD PRESSURE  90 tablet  1  . benzonatate (TESSALON) 200 MG capsule Take 200 mg by mouth at bedtime as needed.      . calcium carbonate (TUMS - DOSED IN MG ELEMENTAL CALCIUM) 500 MG chewable tablet Chew 1 tablet by mouth as needed.        . Canagliflozin (INVOKANA) 100 MG TABS Take 1 tablet by mouth daily.  90 tablet  3  . COLCRYS 0.6 MG tablet Take 1 tablet by mouth daily.      Marland Kitchen COREG CR 40 MG 24 hr capsule TAKE 1 CAPSULE BY MOUTH ONCE A DAY  90 capsule  2  . CRESTOR 10 MG tablet TAKE 1 TABLET (10 MG TOTAL) BY MOUTH DAILY.  90 tablet  2  . diltiazem (CARDIZEM CD) 360 MG 24 hr capsule Take 360 mg by mouth daily.        . Fluticasone-Salmeterol (ADVAIR DISKUS) 100-50 MCG/DOSE AEPB Inhale 1 puff into the lungs 2 (two) times daily.  60 each  11  . glucose blood (ONE TOUCH ULTRA TEST) test strip USE BID  100 each  11  . insulin glargine (LANTUS SOLOSTAR) 100 UNIT/ML injection Inject 50 Units into the skin daily.  10 mL  11  . mometasone (NASONEX) 50 MCG/ACT nasal spray Place 2 sprays into the nose daily.  17 g  11  . pantoprazole (PROTONIX) 40 MG tablet Take 1 tablet by mouth daily.      Marland Kitchen PLAVIX 75 MG tablet TAKE 1 TABLET BY MOUTH ONCE A DAY  90 tablet  2  . sitaGLIPtin (JANUVIA) 100 MG tablet Take 100 mg by mouth daily.        Marland Kitchen venlafaxine (EFFEXOR) 75 MG tablet Take 75 mg by mouth daily.        Marland Kitchen zolpidem (AMBIEN) 5 MG tablet Take 1 tablet (5 mg total) by mouth at bedtime as needed.  30 tablet  5   Allergies  Allergen Reactions  . Pioglitazone     REACTION: weight gain  .  Sulfonamide Derivatives     REACTION: bLISTERS   Family History  Problem Relation Age of Onset  . GER disease Father   . Heart failure Father     CHF  . Ovarian cancer Mother   . Colon cancer    . Colon polyps Father   .  Diabetes     Objective:   Physical Exam BP 154/68  Pulse 70  Temp 97.4 F (36.3 C) (Oral)  Resp 16  Ht 5' 3.5" (1.613 m)  Wt 220 lb (99.791 kg)  BMI 38.36 kg/m2 Constitutional: overweight, in NAD Eyes: PERRLA, EOMI, no exophthalmos ENT: moist mucous membranes, no thyromegaly, no cervical lymphadenopathy Cardiovascular: RRR, No MRG Respiratory: CTA B Gastrointestinal: abdomen soft, NT, ND, BS+ Musculoskeletal: no deformities, strength intact in all 4 Skin: moist, warm, no rashes Neurological: no tremor with outstretched hands, DTR normal in all 4     Assessment:     1. DM2, poorly controlled, insulin-dependent, with complications - CKD stage 3  2. Low TSH    Plan:  1. Diabetes mellitus type 2 - The patient has insulin-dependent DM2, with the latest hemoglobin A1c at 8.9%, which is improved from last value, from 9 months ago, 9.7%. - She does not check her sugars, and I advised her to start doing so. I gave her a sugar log and advised her to check sugars in a.m. and one more time during the day, rotating check times. -  She does not have any lows and her sugars are lower in the morning per previous checks. I explained to her that the fact that she is dropping her sugars from 240 at bedtime 116 the morning suggests that she might be taking too much Lantus with not enough mealtime coverage - For now we will split the Lantus in 2 equal doses and continue her Invokana and Januvia - Of note, she cannot take metformin due to her chronic kidney disease - I would see her back in a month, and depending on her sugars, we might need to start her on mealtime insulin - I would check a microalbumin/creatinine ratio today  - We had a long discussion about healthy  eating, and I tried to address her questions about that her food choices and limiting weight gain - I gave her handouts with healthy eating suggestions, sources of calcium and vitamin D in food, and, as she appeared interested, also references for a vegan diet - She appears ready to institute these changes, and she knows that this will impact her diabetes, her weight, her hypertension, or hyperlipidemia, and most other aspects of her health  2. Low TSH - It is unclear whether this is subclinical versus overt hyperthyroidism, I explained this to her, and we decided to recheck her TSH along with free T3 and free T4. If these are normal, she has subclinical hyperthyroidism, which we usually just follow. If the free T3 and free T4 are abnormal, we will investigate further. Possible culprits are Graves' disease (she does have a family history of this), uni-or multinodular goiter, thyroiditis. I do not feel any nodules in her neck at today's exam. She is clinically euthyroid.  Office Visit on 02/08/2012  Component Date Value Range Status  . Microalb, Ur 02/08/2012 11.8* 0.0 - 1.9 mg/dL Final  . Creatinine,U 02/08/2012 86.5   Final  . Microalb Creat Ratio 02/08/2012 13.6  0.0 - 30.0 mg/g Final  . TSH 02/08/2012 0.15* 0.35 - 5.50 uIU/mL Final  . Free T4 02/08/2012 0.89  0.60 - 1.60 ng/dL Final  . T3, Free 02/08/2012 2.4  2.3 - 4.2 pg/mL Final   Subclinical hyperthyroid - would recheck TSH, free T4, and free T3 in 6 months. Will release lab to [place].

## 2012-02-09 ENCOUNTER — Ambulatory Visit: Payer: Medicare Other | Admitting: Internal Medicine

## 2012-02-11 ENCOUNTER — Encounter: Payer: Self-pay | Admitting: Internal Medicine

## 2012-02-11 ENCOUNTER — Ambulatory Visit (INDEPENDENT_AMBULATORY_CARE_PROVIDER_SITE_OTHER): Payer: Medicare Other | Admitting: Internal Medicine

## 2012-02-11 VITALS — BP 116/62 | HR 68 | Ht 63.0 in | Wt 221.0 lb

## 2012-02-11 DIAGNOSIS — J45909 Unspecified asthma, uncomplicated: Secondary | ICD-10-CM | POA: Diagnosis not present

## 2012-02-11 DIAGNOSIS — R0989 Other specified symptoms and signs involving the circulatory and respiratory systems: Secondary | ICD-10-CM

## 2012-02-11 DIAGNOSIS — R061 Stridor: Secondary | ICD-10-CM | POA: Diagnosis not present

## 2012-02-11 DIAGNOSIS — R0609 Other forms of dyspnea: Secondary | ICD-10-CM | POA: Diagnosis not present

## 2012-02-11 DIAGNOSIS — R0600 Dyspnea, unspecified: Secondary | ICD-10-CM

## 2012-02-11 MED ORDER — PROMETHAZINE-CODEINE 6.25-10 MG/5ML PO SYRP: 5.0000 mL | ORAL_SOLUTION | Freq: Four times a day (QID) | ORAL | Status: DC | PRN

## 2012-02-11 MED ORDER — AZITHROMYCIN 250 MG PO TABS: ORAL_TABLET | ORAL | Status: DC

## 2012-02-11 NOTE — Patient Instructions (Addendum)
Order- CT chest no contrast  Dx dyspnea, stridor, question substernal thyroid  Scripts for cough syrup and for an antibiotic to hold  For the leg cramps at night- try an OJ glass of tonic water flavored with tomato or orange juice at supper time

## 2012-02-11 NOTE — Progress Notes (Signed)
10/29/10- Alllergic rhinitis, asthma, hx urticaria, complicated by DM, renal insufficiency Last here- Dec 05, 2009. Had flu shot She continues to stay with her daughter in [place] for months at a time.  Over the last month has had typical for this season, chest tightness and nasal congestion with wheeze. Qvar not sufficient and she ran out of Singulair. While in [place] she saw cardiologist for dyspnea who did stress test. He recommended she get a sleep study.Treating for HBP and renal insufficiency She is aware of snore, but alone. Denies awareness of daytime sleepiness. Takes alprazolam for occasional insomnia.   01/05/11- Alllergic rhinitis, asthma, hx urticaria, complicated by DM, renal insufficiency Husband here Nose became congested overnight but chest ok. Got saline sinus rinse.  Asthma-preferred Qvar to [facility].  Sleep- wakes frequently unless she takes [place]. She doesn't mind the limb jerks. Discussed meds and oxygen for sleep.  10/05/11-  19 yoF former smoker followed for alllergic rhinitis, asthma/ COPD, hx urticaria, complicated by DM, renal insufficiency, GERD           husband here ACUTE VISIT: Increased SOB worse with activity-worse at night with sleeping--wheezing. Has had increase in weight. Dry cough mostly-through out the day has productive cough-white in color. Never got the sleep study scheduled 11/20/10 last year. CT chest 2005 reported emphysema. Over the past 3 months has had more wheezing shortness of breath. These wake her at night and she feels that she fights for her breath. Wheeze with walking. Has lost some weight. Using Vicks VapoRub which helps. Out of Ventolin which also helped. Noticing nasal congestion. Denies fever, purulent discharge or sore throat. Some swelling in her feet she attributes to gout. Cardiologist in [place] told her "nothing wrong with her heart".  02/11/12- 48 yoF former smoker followed for alllergic rhinitis, asthma/ COPD, hx urticaria,  complicated by DM, renal insufficiency, GERD           husband here FOLLOWS FOR: having SOB, wheezing, cough-productive-white in color, and chest congestion. Review PFT and 6MW test with patient Had flu like illness without fever, after flu shot, slowly resolving. This week a deep breath hurts midsternum. Reports stridor with increased physical exertion. PFT 10/26/2011 mild obstructive airways disease, small airways, insignificant response to bronchodilator. Diffusion moderately reduced. FVC 2.46/92%, FEV1 1.76/94%, FEV1/FVC 0.71, FEF 25/75% 1.13/52%. TLC 86%, DLCO 46%. 6 MWT-99%, 97%, 99%, 297.5 m with complaint of right calf pain but no oxygen limitation. CXR 10/06/11 IMPRESSION:  No acute abnormalities.  Original Report Authenticated By: Burnetta Sabin, M.D.  ROS- see HPI Constitutional:   No-   weight loss, night sweats, fevers, chills, fatigue, lassitude. HEENT:   No-  headaches, difficulty swallowing, tooth/dental problems, sore throat,       No-  sneezing, itching, ear ache, +nasal congestion, post nasal drip,  CV:  No-   chest pain, orthopnea, PND, swelling in lower extremities, anasarca, +dizziness, No-palpitations Resp: + shortness of breath with exertion or at rest.              +  productive cough,  No non-productive cough,  No-  coughing up of blood.              No-   change in color of mucus.  No- wheezing.   Skin: No-   rash or lesions. GI:  No-   heartburn, indigestion, abdominal pain, nausea, vomiting,  GU: MS:  +  joint pain or swelling.  . Neuro- nothing unusual Psych:  No- change in  mood or affect. No depression or anxiety.  No memory loss.  OBJ General- Alert, Oriented, Affect-appropriate, Distress- none acute, Obese, talkative    Skin- rash-none, lesions- none, excoriation- none Lymphadenopathy- none Head- atraumatic            Eyes- Gross vision intact, PERRLA, conjunctivae clear secretions            Ears- Hearing, canals-normal            Nose- mild turbinate  edema, no-Septal dev, mucus, polyps, erosion, perforation             Throat- Mallampati III , mucosa clear , drainage- none, tonsils- atrophic Neck- flexible , trachea midline, no stridor , thyroid nl, carotid no bruit Chest - symmetrical excursion , unlabored           Heart/CV- RRR , no murmur , no gallop  , no rub, nl s1 s2                           - JVD- none , edema mild swelling of feet she blames on "gout", stasis changes- none, varices- none           Lung- clear to P&A, wheeze- none, cough- none , dullness-none, rub- none           Chest wall-  Abd-  Br/ Gen/ Rectal- Not done, not indicated Extrem- cyanosis- none, clubbing, none, atrophy- none, strength- nl. Negative Homans Neuro- grossly intact to observation

## 2012-02-16 ENCOUNTER — Other Ambulatory Visit: Payer: Medicare Other

## 2012-02-18 ENCOUNTER — Ambulatory Visit (INDEPENDENT_AMBULATORY_CARE_PROVIDER_SITE_OTHER)
Admission: RE | Admit: 2012-02-18 | Discharge: 2012-02-18 | Disposition: A | Discharge status: Home / Self Care | Payer: Medicare Other | Source: Ambulatory Visit | Attending: Internal Medicine | Admitting: Internal Medicine

## 2012-02-18 DIAGNOSIS — R0989 Other specified symptoms and signs involving the circulatory and respiratory systems: Secondary | ICD-10-CM | POA: Diagnosis not present

## 2012-02-18 DIAGNOSIS — R061 Stridor: Secondary | ICD-10-CM

## 2012-02-18 DIAGNOSIS — R0609 Other forms of dyspnea: Secondary | ICD-10-CM | POA: Diagnosis not present

## 2012-02-18 DIAGNOSIS — J449 Chronic obstructive pulmonary disease, unspecified: Secondary | ICD-10-CM | POA: Diagnosis not present

## 2012-02-18 DIAGNOSIS — R0600 Dyspnea, unspecified: Secondary | ICD-10-CM

## 2012-02-18 DIAGNOSIS — J438 Other emphysema: Secondary | ICD-10-CM | POA: Diagnosis not present

## 2012-02-21 ENCOUNTER — Other Ambulatory Visit: Payer: Self-pay | Admitting: Nephrology

## 2012-02-21 DIAGNOSIS — N2581 Secondary hyperparathyroidism of renal origin: Secondary | ICD-10-CM | POA: Diagnosis not present

## 2012-02-21 DIAGNOSIS — N183 Chronic kidney disease, stage 3 unspecified: Secondary | ICD-10-CM | POA: Diagnosis not present

## 2012-02-21 DIAGNOSIS — I129 Hypertensive chronic kidney disease with stage 1 through stage 4 chronic kidney disease, or unspecified chronic kidney disease: Secondary | ICD-10-CM | POA: Diagnosis not present

## 2012-02-21 DIAGNOSIS — D649 Anemia, unspecified: Secondary | ICD-10-CM | POA: Diagnosis not present

## 2012-02-21 DIAGNOSIS — I10 Essential (primary) hypertension: Secondary | ICD-10-CM | POA: Diagnosis not present

## 2012-02-21 NOTE — Assessment & Plan Note (Signed)
Reduced diffusion capacity is not consistent with simple asthma and this will need to be watched. Plan-CT chest, no contrast because of renal function. Cough syrup, Z-Pak to

## 2012-02-23 ENCOUNTER — Ambulatory Visit
Admission: RE | Admit: 2012-02-23 | Discharge: 2012-02-23 | Disposition: A | Discharge status: Home / Self Care | Payer: Medicare Other | Source: Ambulatory Visit | Attending: Nephrology | Admitting: Nephrology

## 2012-02-23 DIAGNOSIS — N183 Chronic kidney disease, stage 3 unspecified: Secondary | ICD-10-CM | POA: Diagnosis not present

## 2012-02-25 NOTE — Progress Notes (Signed)
Quick Note:  LMTCB ______ 

## 2012-02-25 NOTE — Progress Notes (Signed)
Quick Note:  Pt aware of results. ______ 

## 2012-03-03 ENCOUNTER — Ambulatory Visit: Payer: Medicare Other | Admitting: Internal Medicine

## 2012-03-09 DIAGNOSIS — N289 Disorder of kidney and ureter, unspecified: Secondary | ICD-10-CM | POA: Diagnosis not present

## 2012-03-09 DIAGNOSIS — F4322 Adjustment disorder with anxiety: Secondary | ICD-10-CM | POA: Diagnosis not present

## 2012-03-09 DIAGNOSIS — I10 Essential (primary) hypertension: Secondary | ICD-10-CM | POA: Diagnosis not present

## 2012-03-09 DIAGNOSIS — E119 Type 2 diabetes mellitus without complications: Secondary | ICD-10-CM | POA: Diagnosis not present

## 2012-03-17 ENCOUNTER — Ambulatory Visit (INDEPENDENT_AMBULATORY_CARE_PROVIDER_SITE_OTHER): Payer: Medicare Other | Admitting: Internal Medicine

## 2012-03-17 ENCOUNTER — Other Ambulatory Visit (INDEPENDENT_AMBULATORY_CARE_PROVIDER_SITE_OTHER): Payer: Medicare Other

## 2012-03-17 ENCOUNTER — Encounter: Payer: Self-pay | Admitting: Internal Medicine

## 2012-03-17 VITALS — BP 130/70 | HR 81 | Temp 97.5°F | Resp 16 | Wt 218.2 lb

## 2012-03-17 DIAGNOSIS — I129 Hypertensive chronic kidney disease with stage 1 through stage 4 chronic kidney disease, or unspecified chronic kidney disease: Secondary | ICD-10-CM

## 2012-03-17 DIAGNOSIS — R609 Edema, unspecified: Secondary | ICD-10-CM | POA: Diagnosis not present

## 2012-03-17 DIAGNOSIS — R079 Chest pain, unspecified: Secondary | ICD-10-CM

## 2012-03-17 DIAGNOSIS — N189 Chronic kidney disease, unspecified: Secondary | ICD-10-CM | POA: Diagnosis not present

## 2012-03-17 DIAGNOSIS — I2510 Atherosclerotic heart disease of native coronary artery without angina pectoris: Secondary | ICD-10-CM

## 2012-03-17 LAB — BASIC METABOLIC PANEL
BUN: 33 mg/dL — ABNORMAL HIGH (ref 6–23)
GFR: 28.13 mL/min — ABNORMAL LOW (ref >60.00)
Potassium: 4.3 mEq/L (ref 3.5–5.1)
Sodium: 136 mEq/L (ref 135–145)

## 2012-03-17 LAB — CBC WITH DIFFERENTIAL/PLATELET
Basophils Absolute: 0 10*3/uL (ref 0.0–0.1)
Lymphocytes Relative: 26.2 % (ref 12.0–46.0)
Monocytes Relative: 9.6 % (ref 3.0–12.0)
Neutrophils Relative %: 60.7 % (ref 43.0–77.0)
Platelets: 204 10*3/uL (ref 150.0–400.0)
RDW: 14.4 % (ref 11.5–14.6)

## 2012-03-17 LAB — CARDIAC PANEL
CK-MB: 2.5 ng/mL (ref 0.3–4.0)
Relative Index: 1.7 calc (ref 0.0–2.5)

## 2012-03-17 LAB — TSH: TSH: 1.03 u[IU]/mL (ref 0.35–5.50)

## 2012-03-17 NOTE — Patient Instructions (Signed)
Chest Pain (Nonspecific) It is often hard to give a specific diagnosis for the cause of chest pain. There is always a chance that your pain could be related to something serious, such as a heart attack or a blood clot in the lungs. You need to follow up with your caregiver for further evaluation. CAUSES   Heartburn.  Pneumonia or bronchitis.  Anxiety or stress.  Inflammation around your heart (pericarditis) or lung (pleuritis or pleurisy).  A blood clot in the lung.  A collapsed lung (pneumothorax). It can develop suddenly on its own (spontaneous pneumothorax) or from injury (trauma) to the chest.  Shingles infection (herpes zoster virus). The chest wall is composed of bones, muscles, and cartilage. Any of these can be the source of the pain.  The bones can be bruised by injury.  The muscles or cartilage can be strained by coughing or overwork.  The cartilage can be affected by inflammation and become sore (costochondritis). DIAGNOSIS  Lab tests or other studies, such as X-rays, electrocardiography, stress testing, or cardiac imaging, may be needed to find the cause of your pain.  TREATMENT   Treatment depends on what may be causing your chest pain. Treatment may include:  Acid blockers for heartburn.  Anti-inflammatory medicine.  Pain medicine for inflammatory conditions.  Antibiotics if an infection is present.  You may be advised to change lifestyle habits. This includes stopping smoking and avoiding alcohol, caffeine, and chocolate.  You may be advised to keep your head raised (elevated) when sleeping. This reduces the chance of acid going backward from your stomach into your esophagus.  Most of the time, nonspecific chest pain will improve within 2 to 3 days with rest and mild pain medicine. HOME CARE INSTRUCTIONS   If antibiotics were prescribed, take your antibiotics as directed. Finish them even if you start to feel better.  For the next few days, avoid physical  activities that bring on chest pain. Continue physical activities as directed.  Do not smoke.  Avoid drinking alcohol.  Only take over-the-counter or prescription medicine for pain, discomfort, or fever as directed by your caregiver.  Follow your caregiver's suggestions for further testing if your chest pain does not go away.  Keep any follow-up appointments you made. If you do not go to an appointment, you could develop lasting (chronic) problems with pain. If there is any problem keeping an appointment, you must call to reschedule. SEEK MEDICAL CARE IF:   You think you are having problems from the medicine you are taking. Read your medicine instructions carefully.  Your chest pain does not go away, even after treatment.  You develop a rash with blisters on your chest. SEEK IMMEDIATE MEDICAL CARE IF:   You have increased chest pain or pain that spreads to your arm, neck, jaw, back, or abdomen.  You develop shortness of breath, an increasing cough, or you are coughing up blood.  You have severe back or abdominal pain, feel nauseous, or vomit.  You develop severe weakness, fainting, or chills.  You have a fever. THIS IS AN EMERGENCY. Do not wait to see if the pain will go away. Get medical help at once. Call your local emergency services (911 in [place] Do not drive yourself to the hospital. MAKE SURE YOU:   Understand these instructions.  Will watch your condition.  Will get help right away if you are not doing well or get worse. Document Released: 10/14/2004 Document Revised: 03/29/2011 Document Reviewed: 08/10/2007 Exit[employer] Patient Information 2013 [employer].  

## 2012-03-17 NOTE — Progress Notes (Signed)
Subjective:    Patient ID: Megan [name], female    DOB: 04-12-1940, 72 y.o.   MRN: [id number]  Chest Pain  This is a new problem. Episode onset: 3 weeks ago. The onset quality is gradual. The problem occurs intermittently. The problem has been unchanged. The pain is present in the substernal region and lateral region. The pain is at a severity of 2/10. The pain is mild. The quality of the pain is described as burning. The pain radiates to the left neck. Associated symptoms include a cough (productive of yellow phlegm), dizziness, lower extremity edema, malaise/fatigue and shortness of breath. Pertinent negatives include no abdominal pain, back pain, claudication, diaphoresis, exertional chest pressure, fever, headaches, hemoptysis, irregular heartbeat, leg pain, nausea, near-syncope, numbness, orthopnea, palpitations, PND, sputum production, syncope, vomiting or weakness. The cough is productive. Nothing relieves the cough. The pain is aggravated by coughing, deep breathing, movement and lifting arm. She has tried acetaminophen for the symptoms. The treatment provided mild relief. Risk factors include obesity, being elderly, post-menopausal and sedentary lifestyle. Prior diagnostic workup includes chest x-ray (chest CT scan).      Review of Systems  Constitutional: Positive for malaise/fatigue and fatigue. Negative for fever, chills, diaphoresis, activity change, appetite change and unexpected weight change.  HENT: Negative.   Eyes: Negative.   Respiratory: Positive for cough (productive of yellow phlegm) and shortness of breath. Negative for apnea, hemoptysis, sputum production, choking, chest tightness, wheezing and stridor.   Cardiovascular: Positive for chest pain. Negative for palpitations, orthopnea, claudication, leg swelling, syncope, PND and near-syncope.  Gastrointestinal: Negative for nausea, vomiting, abdominal pain and constipation.  Endocrine: Negative.   Musculoskeletal: Negative  for myalgias, back pain, joint swelling and gait problem.  Skin: Negative for color change, pallor, rash and wound.  Allergic/Immunologic: Negative.   Neurological: Positive for dizziness. Negative for tremors, weakness, numbness and headaches.  Hematological: Negative for adenopathy. Does not bruise/bleed easily.  Psychiatric/Behavioral: Negative.        Objective:   Physical Exam  Vitals reviewed. Constitutional: She is oriented to person, place, and time. She appears well-developed and well-nourished.  Non-toxic appearance. She does not have a sickly appearance. She does not appear ill. No distress.  HENT:  Head: Normocephalic and atraumatic.  Mouth/Throat: Oropharynx is clear and moist. No oropharyngeal exudate.  Eyes: Conjunctivae are normal. Right eye exhibits no discharge. Left eye exhibits no discharge. No scleral icterus.  Neck: Normal range of motion. Neck supple. No JVD present. No tracheal deviation present. No thyromegaly present.  Cardiovascular: Normal rate, regular rhythm, normal heart sounds and intact distal pulses.  Exam reveals no gallop and no friction rub.   No murmur heard. Pulmonary/Chest: Effort normal and breath sounds normal. No stridor. No respiratory distress. She has no wheezes. She has no rales. Chest wall is not dull to percussion. She exhibits tenderness and bony tenderness. She exhibits no mass, no crepitus, no edema and no swelling.    Abdominal: Soft. Bowel sounds are normal. She exhibits no distension and no mass. There is no tenderness. There is no rebound and no guarding.  Musculoskeletal: Normal range of motion. She exhibits edema (trace edema in BLE). She exhibits no tenderness.  Lymphadenopathy:    She has no cervical adenopathy.  Neurological: She is oriented to person, place, and time.  Skin: Skin is warm and dry. No rash noted. She is not diaphoretic. No erythema. No pallor.  Psychiatric: She has a normal mood and affect. Her behavior is  normal. Judgment  and thought content normal.     Lab Results  Component Value Date   WBC 10.8* 02/03/2012   HGB 12.4 02/03/2012   HCT 37.2 02/03/2012   PLT 246.0 02/03/2012   GLUCOSE 202* 02/03/2012   CHOL 169 05/13/2011   TRIG 210.0* 05/13/2011   HDL 59.30 05/13/2011   LDLDIRECT 91.4 05/13/2011   ALT 30 02/03/2012   AST 29 02/03/2012   NA 135 02/03/2012   K 4.5 02/03/2012   CL 102 02/03/2012   CREATININE 2.2* 02/03/2012   BUN 28* 02/03/2012   CO2 22 02/03/2012   TSH 0.15* 02/08/2012   HGBA1C 8.9* 02/03/2012   MICROALBUR 11.8* 02/08/2012   Ct Chest Wo Contrast  02/18/2012  *RADIOLOGY REPORT*  Clinical Data: Dyspnea.  Stridor.  Evaluate for substernal thyroid gland.  Shortness of breath and productive cough.  CT CHEST WITHOUT CONTRAST  Technique:  Multidetector CT imaging of the chest was performed following the standard protocol without IV contrast.  Comparison: Chest CT 10/02/2003.  Findings:  Mediastinum: Heart size is normal. There is no significant pericardial fluid, thickening or pericardial calcification. There is atherosclerosis of the thoracic aorta, the great vessels of the mediastinum and the coronary arteries, including calcified atherosclerotic plaque in the left main, left anterior descending, left circumflex and right coronary arteries. No pathologically enlarged mediastinal or hilar lymph nodes. Please note that accurate exclusion of hilar adenopathy is limited on noncontrast CT scans.There is a small hiatal hernia. Thyroid gland is normal in appearance.  No substernal thyroid extension appreciated.  Lungs/Pleura: 4 mm ground-glass attenuation nodule in the left lower lobe (image 23 of series 3) is unchanged compared to remote prior examination from 10/02/2003, and can be considered radiographically benign requiring no imaging follow-up.  No other larger more suspicious appearing pulmonary nodules or masses are otherwise noted.  No acute consolidative airspace disease.  No pleural effusions.   Mild diffuse bronchial wall thickening with moderate centrilobular emphysema.  Upper Abdomen: Numerous colonic diverticula are noted in the region of the distal transverse colon.  Musculoskeletal: There are no aggressive appearing lytic or blastic lesions noted in the visualized portions of the skeleton.  IMPRESSION: 1.  No acute findings in the thorax to account for the patient's symptoms. 2.  Mild diffuse bronchial wall thickening with moderate centrilobular emphysema; imaging findings compatible with underlying COPD. 3. Atherosclerosis, including left main and three-vessel coronary artery disease.   Assessment for potential risk factor modification, dietary therapy or pharmacologic therapy may be warranted, if clinically indicated. 4.  No evidence of substernal thyroid. 5.  Small hiatal hernia. 6.  Additional incidental findings, as above.   Original Report Authenticated By: Vinnie Langton, M.D.    US Renal  02/23/2012  *RADIOLOGY REPORT*  Clinical Data: Stage III chronic kidney disease.  RENAL/URINARY TRACT ULTRASOUND COMPLETE  Comparison:  CT abdomen and pelvis 05/18/2011.  Findings:  Right Kidney:  No hydronephrosis.  Well-preserved cortex.  Normal size and parenchymal echotexture without focal abnormalities. The maximal length is the 11.2 cm, within normal limits.  Left Kidney:  Left kidney is slightly more echogenic than the right.  The parenchyma is also thinned compared the right.  There is no mass lesion, stone, or hydronephrosis.  Maximal length is 9.2 cm.  Bladder:  Normal  Incidental note is made of fatty infiltration of the liver.  IMPRESSION:  1.  Somewhat echogenic left kidney with thinning of the parenchyma compared to the right.  This is nonspecific, but can be seen in the  setting of chronic medical renal disease. 2.  Normal appearance of the right kidney. 3.  Fatty infiltration of the liver.   Original Report Authenticated By: San Morelle, M.D.      Assessment & Plan:

## 2012-03-18 NOTE — Assessment & Plan Note (Signed)
Her EKG is normal I will check her enzymes today to see if she is having ischemia Her recent CT scan showed atherosclerosis

## 2012-03-18 NOTE — Assessment & Plan Note (Signed)
This was seen on her recent CT scan I have asked her to see cardiology

## 2012-03-18 NOTE — Assessment & Plan Note (Signed)
Her BP is well controlled 

## 2012-03-22 DIAGNOSIS — I10 Essential (primary) hypertension: Secondary | ICD-10-CM | POA: Diagnosis not present

## 2012-03-22 DIAGNOSIS — N183 Chronic kidney disease, stage 3 unspecified: Secondary | ICD-10-CM | POA: Diagnosis not present

## 2012-03-22 DIAGNOSIS — D649 Anemia, unspecified: Secondary | ICD-10-CM | POA: Diagnosis not present

## 2012-03-29 ENCOUNTER — Ambulatory Visit (INDEPENDENT_AMBULATORY_CARE_PROVIDER_SITE_OTHER)
Admission: RE | Admit: 2012-03-29 | Discharge: 2012-03-29 | Disposition: A | Discharge status: Home / Self Care | Payer: Medicare Other | Source: Ambulatory Visit | Attending: Internal Medicine | Admitting: Internal Medicine

## 2012-03-29 ENCOUNTER — Ambulatory Visit (INDEPENDENT_AMBULATORY_CARE_PROVIDER_SITE_OTHER): Payer: Medicare Other | Admitting: Internal Medicine

## 2012-03-29 ENCOUNTER — Encounter: Payer: Self-pay | Admitting: Internal Medicine

## 2012-03-29 VITALS — BP 124/78 | HR 66 | Ht 63.0 in | Wt 220.4 lb

## 2012-03-29 DIAGNOSIS — M542 Cervicalgia: Secondary | ICD-10-CM

## 2012-03-29 DIAGNOSIS — R079 Chest pain, unspecified: Secondary | ICD-10-CM | POA: Diagnosis not present

## 2012-03-29 DIAGNOSIS — J3089 Other allergic rhinitis: Secondary | ICD-10-CM

## 2012-03-29 DIAGNOSIS — J45909 Unspecified asthma, uncomplicated: Secondary | ICD-10-CM

## 2012-03-29 DIAGNOSIS — J302 Other seasonal allergic rhinitis: Secondary | ICD-10-CM

## 2012-03-29 DIAGNOSIS — J309 Allergic rhinitis, unspecified: Secondary | ICD-10-CM

## 2012-03-29 MED ORDER — AZELASTINE-FLUTICASONE 137-50 MCG/ACT NA SUSP: 1.0000 | Freq: Once | NASAL | Status: DC

## 2012-03-29 NOTE — Progress Notes (Signed)
10/29/10- Alllergic rhinitis, asthma, hx urticaria, complicated by DM, renal insufficiency Last here- Dec 05, 2009. Had flu shot She continues to stay with her daughter in [place] for months at a time.  Over the last month has had typical for this season, chest tightness and nasal congestion with wheeze. Qvar not sufficient and she ran out of Singulair. While in [place] she saw cardiologist for dyspnea who did stress test. He recommended she get a sleep study.Treating for HBP and renal insufficiency She is aware of snore, but alone. Denies awareness of daytime sleepiness. Takes alprazolam for occasional insomnia.   01/05/11- Alllergic rhinitis, asthma, hx urticaria, complicated by DM, renal insufficiency Husband here Nose became congested overnight but chest ok. Got saline sinus rinse.  Asthma-preferred Qvar to [facility].  Sleep- wakes frequently unless she takes [place]. She doesn't mind the limb jerks. Discussed meds and oxygen for sleep.  10/05/11-  23 yoF former smoker followed for alllergic rhinitis, asthma/ COPD, hx urticaria, complicated by DM, renal insufficiency, GERD           husband here ACUTE VISIT: Increased SOB worse with activity-worse at night with sleeping--wheezing. Has had increase in weight. Dry cough mostly-through out the day has productive cough-white in color. Never got the sleep study scheduled 11/20/10 last year. CT chest 2005 reported emphysema. Over the past 3 months has had more wheezing shortness of breath. These wake her at night and she feels that she fights for her breath. Wheeze with walking. Has lost some weight. Using Vicks VapoRub which helps. Out of Ventolin which also helped. Noticing nasal congestion. Denies fever, purulent discharge or sore throat. Some swelling in her feet she attributes to gout. Cardiologist in [place] told her "nothing wrong with her heart".  02/11/12- 37 yoF former smoker followed for alllergic rhinitis, asthma/ COPD, hx urticaria,  complicated by DM, renal insufficiency, GERD           husband here FOLLOWS FOR: having SOB, wheezing, cough-productive-white in color, and chest congestion. Review PFT and 6MW test with patient Had flu like illness without fever, after flu shot, slowly resolving. This week a deep breath hurts midsternum. Reports stridor with increased physical exertion. PFT 10/26/2011 mild obstructive airways disease, small airways, insignificant response to bronchodilator. Diffusion moderately reduced. FVC 2.46/92%, FEV1 1.76/94%, FEV1/FVC 0.71, FEF 25/75% 1.13/52%. TLC 86%, DLCO 46%. 6 MWT-99%, 97%, 99%, 297.5 m with complaint of right calf pain but no oxygen limitation. CXR 10/06/11 IMPRESSION:  No acute abnormalities.  Original Report Authenticated By: Burnetta Sabin, M.D.  03/29/12- 39 yoF former smoker followed for alllergic rhinitis, asthma/ COPD, hx urticaria, complicated by DM, renal insufficiency, GERD         Son here.   ACUTE VISIT: still having trouble breathing; still having soreness in chest that raidates to shoulders and back-pulls from sore spot when sneezing. Also, back of tongue not helping with swallowing food as it should; jaw tires easily. Head congestion;-no color when blowing nose.  At last visit we had given Z-Pak and cough syrup. That may have helped at the time. Her left upper anterior chest wall sore spot persists, increased by sneezing or lifting her left arm which causes sharp pain across anterior chest. No trauma. Chest CT showed no obvious cause. She does not describe anything anginal. Now with acute upper respiratory complaints of right ear pain and nasal congestion. She restarted Nasonex 3 days ago. CT chest 02/25/12- images reviewed with her IMPRESSION:  1. No acute findings in the thorax  to account for the patient's  symptoms.  2. Mild diffuse bronchial wall thickening with moderate  centrilobular emphysema; imaging findings compatible with  underlying COPD.  3. Atherosclerosis,  including left main and three-vessel coronary  artery disease. Assessment for potential risk factor  modification, dietary therapy or pharmacologic therapy may be  warranted, if clinically indicated.  4. No evidence of substernal thyroid.  5. Small hiatal hernia.  6. Additional incidental findings, as above.  Original Report Authenticated By: Vinnie Langton, M.D.  ROS- see HPI Constitutional:   No-   weight loss, night sweats, fevers, chills, fatigue, lassitude. HEENT:   No-  headaches, difficulty swallowing, tooth/dental problems, sore throat,       No-  sneezing, itching, ear ache, +nasal congestion, post nasal drip,  CV:  +noncardiac chest pain, no-orthopnea, PND, swelling in lower extremities, anasarca, +dizziness, No-palpitations Resp: + shortness of breath with exertion or at rest.              +  productive cough,  No non-productive cough,  No-  coughing up of blood.              No-   change in color of mucus.  No- wheezing.   Skin: No-   rash or lesions. GI:  No-   heartburn, indigestion, abdominal pain, nausea, vomiting,  GU: MS:  +  joint pain or swelling.  . Neuro- nothing unusual Psych:  No- change in mood or affect. No depression or anxiety.  No memory loss.  OBJ General- Alert, Oriented, Affect-appropriate, Distress- none acute, Obese, talkative    Skin- rash-none, lesions- none, excoriation- none Lymphadenopathy- none Head- atraumatic            Eyes- Gross vision intact, PERRLA, conjunctivae clear secretions            Ears- Hearing, canals-normal. Nonobstructive cerumen.            Nose- +wet pale mucosa, no-Septal dev, +mucus bridging, no-polyps, erosion, perforation             Throat- Mallampati III , mucosa clear , drainage- none, tonsils- atrophic Neck- flexible , trachea midline, no stridor , thyroid nl, carotid no bruit Chest - symmetrical excursion , unlabored           Heart/CV- RRR , no murmur , no gallop  , no rub, nl s1 s2                           -  JVD- none , +trace edema, no-stasis changes- none, varices- none           Lung- clear to P&A, wheeze- none, cough- none , dullness-none, rub- none           Chest wall-  Abd-  Br/ Gen/ Rectal- Not done, not indicated Extrem- cyanosis- none, clubbing, none, atrophy- none, strength- nl. these Neuro- grossly intact to observation

## 2012-03-29 NOTE — Patient Instructions (Addendum)
Sample Dymista nasal spray     1-2 puffs each nostril once every night at bedtime     Try this instead of the Nasonex for now  Order- XRay Cervical spine    Dx neck pain

## 2012-03-31 ENCOUNTER — Encounter: Payer: Self-pay | Admitting: Internal Medicine

## 2012-04-01 NOTE — Assessment & Plan Note (Signed)
Acute bronchitic exacerbation in January responded to treatment then. Now controlled.

## 2012-04-01 NOTE — Assessment & Plan Note (Signed)
Seasonal exacerbation. Plan-sample Dymista with discussion.

## 2012-04-01 NOTE — Assessment & Plan Note (Addendum)
Symptoms strongly suggests musculoskeletal pain affected by moving anterior chest wall or left upper arm. Pattern is not progressive. Nerve root irritation from degenerative spine disease would be a possibility Plan -C-spine x-ray. This will probably indicate if there is significant degenerative disease. We can also get thoracic spine images if needed.

## 2012-04-05 NOTE — Progress Notes (Signed)
Quick Note:  Pt aware of results. ______ 

## 2012-04-05 NOTE — Progress Notes (Signed)
Quick Note:  LMTCB ______ 

## 2012-04-10 ENCOUNTER — Encounter: Payer: Self-pay | Admitting: Cardiovascular Disease

## 2012-04-10 ENCOUNTER — Ambulatory Visit (INDEPENDENT_AMBULATORY_CARE_PROVIDER_SITE_OTHER): Payer: Medicare Other | Admitting: Cardiovascular Disease

## 2012-04-10 VITALS — BP 126/68 | HR 66 | Ht 63.0 in | Wt 222.0 lb

## 2012-04-10 DIAGNOSIS — R079 Chest pain, unspecified: Secondary | ICD-10-CM

## 2012-04-10 DIAGNOSIS — R0989 Other specified symptoms and signs involving the circulatory and respiratory systems: Secondary | ICD-10-CM

## 2012-04-10 DIAGNOSIS — R0600 Dyspnea, unspecified: Secondary | ICD-10-CM

## 2012-04-10 DIAGNOSIS — R0609 Other forms of dyspnea: Secondary | ICD-10-CM | POA: Diagnosis not present

## 2012-04-10 NOTE — Patient Instructions (Addendum)
Your physician recommends that you schedule a follow-up appointment in:  3-4 weeks.   Your physician has requested that you have an echocardiogram. Echocardiography is a painless test that uses sound waves to create images of your heart. It provides your doctor with information about the size and shape of your heart and how well your heart's chambers and valves are working. This procedure takes approximately one hour. There are no restrictions for this procedure.   Your physician has requested that you have a lexiscan myoview. For further information please visit [email or web address]. Please follow instruction sheet, as given.

## 2012-04-10 NOTE — Progress Notes (Signed)
History of Present Illness: 72 yo female with history of CVA, DM, HTN, HLD who is here today for new patient evaluation of chest pain. She was seen most recently by Dr. Ronnald Ramp in primary care 03/17/12 and c/o chest pains. EKG normal on 03/17/12. CT chest January 2014 for stridor and found incidentally to have calcification of the left main and all three major epicardial vessels. She had an evaluation by a cardiologist in [place] in June 2012 and had a normal echo per her report and a normal stress test.   She describes left sided chest pain that began one month ago on 03/17/12. This was left sided pain that radiated to her upper back and neck for three weeks and has now resolved. She notes becoming dyspneic with minimal activity. Her chest pain was positional and worsened with moving her arm. No dizziness, near syncope or near syncope. No prior cardiac history.   Primary Care Physician: Scarlette Calico  Last Lipid Profile:Lipid Panel     Component Value Date/Time   CHOL 169 05/13/2011 1536   TRIG 210.0* 05/13/2011 1536   TRIG 142 11/25/2010 1359   HDL 59.30 05/13/2011 1536   CHOLHDL 3 05/13/2011 1536   VLDL 42.0* 05/13/2011 1536     Past Medical History  Diagnosis Date  . History of CVA (cerebrovascular accident) 2004  . Loss of smell 2004    due to CVA  . Gastritis   . Umbilical hernia   . Esophageal stricture   . History of chickenpox   . Allergic rhinitis   . Hypertension   . Gout   . GERD (gastroesophageal reflux disease)   . Diverticulosis of colon   . Diabetes mellitus type II   . Colon polyp, hyperplastic   . Asthma   . Renal insufficiency   . Hyperlipidemia     Past Surgical History  Procedure Laterality Date  . Appendectomy    . Vesicovaginal fistula closure w/ tah    . Hemorroidectomy    . Cesarean section      x 2  . Abdominal hysterectomy      Current Outpatient Prescriptions  Medication Sig Dispense Refill  . albuterol (PROVENTIL HFA;VENTOLIN HFA) 108 (90 BASE)  MCG/ACT inhaler Inhale 2 puffs into the lungs every 4 (four) hours as needed.      Marland Kitchen amLODipine (NORVASC) 10 MG tablet Take 1 tablet by mouth daily.      . Azelastine-Fluticasone (DYMISTA) 137-50 MCG/ACT SUSP Place 1-2 sprays into the nose once. 1- 2 sprays each nostril at bedtime.  1 Bottle  0  . BENICAR 40 MG tablet Take 40 mg by mouth daily.       . Canagliflozin (INVOKANA) 100 MG TABS Take 1 tablet by mouth daily.  90 tablet  3  . COLCRYS 0.6 MG tablet Take 1 tablet by mouth daily as needed.       . CRESTOR 10 MG tablet TAKE 1 TABLET (10 MG TOTAL) BY MOUTH DAILY.  90 tablet  2  . Fluticasone-Salmeterol (ADVAIR DISKUS) 100-50 MCG/DOSE AEPB Inhale 1 puff into the lungs 2 (two) times daily.  60 each  11  . furosemide (LASIX) 40 MG tablet Take 1 tablet by mouth daily.      Marland Kitchen glucose blood (ONE TOUCH ULTRA TEST) test strip USE BID  100 each  11  . insulin glargine (LANTUS SOLOSTAR) 100 UNIT/ML injection Inject 25 Units into the skin 2 (two) times daily.  10 mL  11  . labetalol (  NORMODYNE) 200 MG tablet Take 1 tablet by mouth 2 (two) times daily.      . pantoprazole (PROTONIX) 40 MG tablet Take 1 tablet by mouth daily.      Marland Kitchen PLAVIX 75 MG tablet TAKE 1 TABLET BY MOUTH ONCE A DAY  90 tablet  2  . sitaGLIPtin (JANUVIA) 100 MG tablet Take 100 mg by mouth daily.        Marland Kitchen venlafaxine (EFFEXOR) 75 MG tablet Take 75 mg by mouth. Every 3 days      . zolpidem (AMBIEN) 5 MG tablet Take 1 tablet (5 mg total) by mouth at bedtime as needed.  30 tablet  5   No current facility-administered medications for this visit.    Allergies  Allergen Reactions  . Pioglitazone     REACTION: weight gain  . Sulfonamide Derivatives     REACTION: bLISTERS    History   Social History  . Marital Status: Married    Spouse Name: N/A    Number of Children: 2  . Years of Education: N/A   Occupational History  . Retired Pharmacist, hospital   .     Social History Main Topics  . Smoking status: Former Smoker -- 1.00 packs/day  for 30 years    Types: Cigarettes    Quit date: 01/18/2002  . Smokeless tobacco: Never Used  . Alcohol Use: No  . Drug Use: No  . Sexually Active: No   Other Topics Concern  . Not on file   Social History Narrative  . No narrative on file    Family History  Problem Relation Age of Onset  . GER disease Father   . Heart failure Father     CHF  . Ovarian cancer Mother   . Colon cancer    . Colon polyps Father   . Diabetes    . CAD Neg Hx     Review of Systems:  As stated in the HPI and otherwise negative.   BP 126/68  Pulse 66  Ht 5\' 3"  (1.6 m)  Wt 222 lb (100.699 kg)  BMI 39.34 kg/m2  SpO2 96%  Physical Examination: General: Well developed, well nourished, NAD HEENT: OP clear, mucus membranes moist SKIN: warm, dry. No rashes. Neuro: No focal deficits Musculoskeletal: Muscle strength 5/5 all ext Psychiatric: Mood and affect normal Neck: No JVD, no carotid bruits, no thyromegaly, no lymphadenopathy. Lungs:Clear bilaterally, no wheezes, rhonci, crackles Cardiovascular: Regular rate and rhythm. No murmurs, gallops or rubs. Abdomen:Soft. Bowel sounds present. Non-tender.  Extremities: No lower extremity edema. Pulses are 2 + in the bilateral DP/PT.  EKG: NSR, rate 66 bpm.   Assessment and Plan:   1. Chest pain: She has multiple risk factors for CAD including HTN, HLD, DM and has had calcium noted in coronaries on non heart dedicated CT chest. Given her recent chest pains, coronary calcfiication on CT and dyspnea along with major risk factors, will arrange stress myoview to exclude ischemia and echo to assess LV function, size and exclude valvular issues. We have discussed a cardiac cath to define PA pressures and exclude CAD but her baseline creatinine is 2.2.

## 2012-04-13 ENCOUNTER — Ambulatory Visit (HOSPITAL_COMMUNITY): Payer: Medicare Other | Attending: Cardiology | Admitting: Radiology

## 2012-04-13 DIAGNOSIS — R0989 Other specified symptoms and signs involving the circulatory and respiratory systems: Secondary | ICD-10-CM | POA: Diagnosis not present

## 2012-04-13 DIAGNOSIS — E119 Type 2 diabetes mellitus without complications: Secondary | ICD-10-CM | POA: Diagnosis not present

## 2012-04-13 DIAGNOSIS — R072 Precordial pain: Secondary | ICD-10-CM

## 2012-04-13 DIAGNOSIS — R079 Chest pain, unspecified: Secondary | ICD-10-CM

## 2012-04-13 DIAGNOSIS — R0609 Other forms of dyspnea: Secondary | ICD-10-CM | POA: Diagnosis not present

## 2012-04-13 DIAGNOSIS — R0600 Dyspnea, unspecified: Secondary | ICD-10-CM

## 2012-04-13 DIAGNOSIS — E785 Hyperlipidemia, unspecified: Secondary | ICD-10-CM | POA: Diagnosis not present

## 2012-04-13 DIAGNOSIS — I10 Essential (primary) hypertension: Secondary | ICD-10-CM | POA: Diagnosis not present

## 2012-04-13 NOTE — Progress Notes (Signed)
Echocardiogram performed.  

## 2012-04-14 ENCOUNTER — Telehealth: Payer: Self-pay | Admitting: Cardiovascular Disease

## 2012-04-14 NOTE — Telephone Encounter (Signed)
Spoke with pt and reviewed echo results with her.

## 2012-04-14 NOTE — Telephone Encounter (Signed)
Follow Up    Pt returning phone call from earlier. Please call.

## 2012-04-17 ENCOUNTER — Ambulatory Visit (HOSPITAL_COMMUNITY): Payer: Medicare Other | Attending: Cardiology | Admitting: Radiology

## 2012-04-17 VITALS — BP 142/54 | Ht 63.0 in | Wt 215.0 lb

## 2012-04-17 DIAGNOSIS — R0989 Other specified symptoms and signs involving the circulatory and respiratory systems: Secondary | ICD-10-CM | POA: Insufficient documentation

## 2012-04-17 DIAGNOSIS — Z87891 Personal history of nicotine dependence: Secondary | ICD-10-CM | POA: Diagnosis not present

## 2012-04-17 DIAGNOSIS — Z8673 Personal history of transient ischemic attack (TIA), and cerebral infarction without residual deficits: Secondary | ICD-10-CM | POA: Diagnosis not present

## 2012-04-17 DIAGNOSIS — R0600 Dyspnea, unspecified: Secondary | ICD-10-CM

## 2012-04-17 DIAGNOSIS — R0609 Other forms of dyspnea: Secondary | ICD-10-CM | POA: Insufficient documentation

## 2012-04-17 DIAGNOSIS — E119 Type 2 diabetes mellitus without complications: Secondary | ICD-10-CM | POA: Insufficient documentation

## 2012-04-17 DIAGNOSIS — R079 Chest pain, unspecified: Secondary | ICD-10-CM | POA: Diagnosis not present

## 2012-04-17 MED ORDER — TECHNETIUM TC 99M SESTAMIBI GENERIC - CARDIOLITE
11.0000 | Freq: Once | INTRAVENOUS | Status: AC | PRN
  Administered 2012-04-17: 11 via INTRAVENOUS

## 2012-04-17 MED ORDER — TECHNETIUM TC 99M SESTAMIBI GENERIC - CARDIOLITE
33.0000 | Freq: Once | INTRAVENOUS | Status: AC | PRN
  Administered 2012-04-17: 33 via INTRAVENOUS

## 2012-04-17 MED ORDER — REGADENOSON 0.4 MG/5ML IV SOLN
0.4000 mg | Freq: Once | INTRAVENOUS | Status: AC
  Administered 2012-04-17: 0.4 mg via INTRAVENOUS

## 2012-04-17 NOTE — Progress Notes (Signed)
[facility] SITE 3 NUCLEAR MED [place], [place] [address] [phone]    Cardiology Nuclear Med Study  Megan [name] is a 72 y.o. female     MRN : [id number]     DOB: 1940/04/20  Procedure Date: 04/17/2012  Nuclear Med Background Indication for Stress Test:  Evaluation for Ischemia History:  Asthma, COPD and 04/13/12 ECHO: EF: 65-70% mild LVH in [place] '12 GXT: [facility], No report 01/14 CT: calcification of (L) main and other coronary arteries Cardiac Risk Factors: CVA, History of Smoking, Hypertension, IDDM Type 2 and Lipids  Symptoms:  Chest Pain (last date of chest discomfort 04/17/2012), DOE and SOB   Nuclear Pre-Procedure Caffeine/Decaff Intake:  None > 12 hrs NPO After: 7:30am   Lungs:  clear O2 Sat: 96% on room air. IV 0.9% NS with Angio Cath:  22g  IV Site: R Antecubital x 1, tolerated well IV Started by:  Irven Baltimore, RN  Chest Size (in):  44 Cup Size: DD  Height: 5\' 3"  (1.6 m)  Weight:  215 lb (97.523 kg)  BMI:  Body mass index is 38.09 kg/(m^2). Tech Comments:  Held Labetalol this am. Fasting CBG @ 7:30am was 114 per patient, and 1/2 dose Lantus Insulin taken with breakfast. No po diabetic medications taken today.    Nuclear Med Study 1 or 2 day study: 1 day  Stress Test Type:  Carlton Adam  Reading MD: Kirk Ruths, MD  Order Authorizing Provider:  Lauree Chandler, MD  Resting Radionuclide: Technetium 3m Sestamibi  Resting Radionuclide Dose: 11.0 mCi   Stress Radionuclide:  Technetium 51m Sestamibi  Stress Radionuclide Dose: 33.0 mCi           Stress Protocol Rest HR: 59 Stress HR: 92  Rest BP: 142/54 Stress BP: 158/58  Exercise Time (min): n/a METS: n/a   Predicted Max HR: 147 bpm % Max HR: 62.59 bpm Rate Pressure Product: 14536   Dose of Adenosine (mg):  n/a Dose of Lexiscan: 0.4 mg  Dose of Atropine (mg): n/a Dose of Dobutamine: n/a mcg/kg/min (at max HR)  Stress Test Technologist: Perrin Maltese, EMT-P  Nuclear Technologist:   Charlton Amor, CNMT     Rest Procedure:  Myocardial perfusion imaging was performed at rest 45 minutes following the intravenous administration of Technetium 62m Sestamibi. Rest ECG: Sinus bradycardia, PRWP  Stress Procedure:  The patient received IV Lexiscan 0.4 mg over 15-seconds.  Technetium 34m Sestamibi injected at 30-seconds. This patient was sob, had chest discomfort, nausea, and a headache with the Lexiscan injection. Quantitative spect images were obtained after a 45 minute delay. Stress ECG: No significant ST segment change suggestive of ischemia.  QPS Raw Data Images:  Acquisition technically good; normal left ventricular size. Stress Images:  There is decreased uptake in the apex. Rest Images:  There is decreased uptake in the apex. Subtraction (SDS):  No evidence of ischemia. Transient Ischemic Dilatation (Normal <1.22):  0.79 Lung/Heart Ratio (Normal <0.45):  0.31  Quantitative Gated Spect Images QGS EDV:  57 ml QGS ESV:  10 ml  Impression Exercise Capacity:  Lexiscan with no exercise. BP Response:  Normal blood pressure response. Clinical Symptoms:  There is chest pain and dyspnea. ECG Impression:  No significant ST segment change suggestive of ischemia. Comparison with Prior Nuclear Study: No images to compare  Overall Impression:  Normal stress nuclear study with a small, mild, fixed apical defect consistent with thinning; no ischemia.  LV Ejection Fraction: 82%.  LV Wall Motion:  NL LV Function; NL Wall Motion  Kirk Ruths

## 2012-04-22 ENCOUNTER — Other Ambulatory Visit: Payer: Self-pay | Admitting: Internal Medicine

## 2012-04-27 ENCOUNTER — Ambulatory Visit: Payer: Medicare Other | Admitting: Cardiovascular Disease

## 2012-05-03 ENCOUNTER — Encounter: Payer: Self-pay | Admitting: Internal Medicine

## 2012-05-03 ENCOUNTER — Ambulatory Visit (INDEPENDENT_AMBULATORY_CARE_PROVIDER_SITE_OTHER): Payer: Medicare Other | Admitting: Internal Medicine

## 2012-05-03 ENCOUNTER — Other Ambulatory Visit (INDEPENDENT_AMBULATORY_CARE_PROVIDER_SITE_OTHER): Payer: Medicare Other

## 2012-05-03 VITALS — BP 130/56 | HR 60 | Temp 96.8°F | Resp 16 | Wt 217.2 lb

## 2012-05-03 DIAGNOSIS — N189 Chronic kidney disease, unspecified: Secondary | ICD-10-CM | POA: Diagnosis not present

## 2012-05-03 DIAGNOSIS — N183 Chronic kidney disease, stage 3 unspecified: Secondary | ICD-10-CM

## 2012-05-03 DIAGNOSIS — I129 Hypertensive chronic kidney disease with stage 1 through stage 4 chronic kidney disease, or unspecified chronic kidney disease: Secondary | ICD-10-CM

## 2012-05-03 DIAGNOSIS — E785 Hyperlipidemia, unspecified: Secondary | ICD-10-CM | POA: Diagnosis not present

## 2012-05-03 DIAGNOSIS — E1129 Type 2 diabetes mellitus with other diabetic kidney complication: Secondary | ICD-10-CM

## 2012-05-03 DIAGNOSIS — E1165 Type 2 diabetes mellitus with hyperglycemia: Secondary | ICD-10-CM | POA: Diagnosis not present

## 2012-05-03 DIAGNOSIS — M542 Cervicalgia: Secondary | ICD-10-CM

## 2012-05-03 LAB — URINALYSIS, ROUTINE W REFLEX MICROSCOPIC
Bilirubin Urine: NEGATIVE
Ketones, ur: NEGATIVE
Total Protein, Urine: NEGATIVE
Urine Glucose: >=1000
pH: 5.5 (ref 5.0–8.0)

## 2012-05-03 LAB — CBC WITH DIFFERENTIAL/PLATELET
Basophils Absolute: 0 10*3/uL (ref 0.0–0.1)
Hemoglobin: 12.2 g/dL (ref 12.0–15.0)
Lymphocytes Relative: 32.6 % (ref 12.0–46.0)
Monocytes Relative: 9.9 % (ref 3.0–12.0)
Neutro Abs: 4.3 10*3/uL (ref 1.4–7.7)
Neutrophils Relative %: 54.7 % (ref 43.0–77.0)
Platelets: 191 10*3/uL (ref 150.0–400.0)
RDW: 14.3 % (ref 11.5–14.6)

## 2012-05-03 LAB — COMPREHENSIVE METABOLIC PANEL
ALT: 45 U/L — ABNORMAL HIGH (ref 0–35)
BUN: 41 mg/dL — ABNORMAL HIGH (ref 6–23)
CO2: 23 mEq/L (ref 19–32)
Calcium: 9.4 mg/dL (ref 8.4–10.5)
Chloride: 103 mEq/L (ref 96–112)
Creatinine, Ser: 2.6 mg/dL — ABNORMAL HIGH (ref 0.4–1.2)
GFR: 23.29 mL/min — ABNORMAL LOW (ref >60.00)

## 2012-05-03 LAB — LIPID PANEL
Cholesterol: 151 mg/dL (ref 0–200)
HDL: 45.4 mg/dL (ref >39.00)
Triglycerides: 196 mg/dL — ABNORMAL HIGH (ref 0.0–149.0)
VLDL: 39.2 mg/dL (ref 0.0–40.0)

## 2012-05-03 MED ORDER — PREGABALIN 50 MG PO CAPS: 50.0000 mg | ORAL_CAPSULE | Freq: Three times a day (TID) | ORAL | Status: DC

## 2012-05-03 NOTE — Progress Notes (Signed)
Subjective:    Patient ID: Megan [name], female    DOB: 10/28/1940, 72 y.o.   MRN: [id number]  HPI  She returns and complains of persistent pain throughout her neck, the pain radiates up into the back of her head. She had tried lyrica with significant relief from the pain. The pain is described as aching and burning.  Review of Systems  Constitutional: Negative.  Negative for fever, chills, diaphoresis, activity change, appetite change, fatigue and unexpected weight change.  HENT: Positive for neck pain. Negative for sore throat, trouble swallowing, neck stiffness and voice change.   Eyes: Negative.   Respiratory: Negative for cough, chest tightness, wheezing and stridor.   Cardiovascular: Negative.  Negative for chest pain, palpitations and leg swelling.  Gastrointestinal: Negative.  Negative for nausea, vomiting, abdominal pain, diarrhea and constipation.  Endocrine: Negative.  Negative for polydipsia, polyphagia and polyuria.  Genitourinary: Negative.  Negative for dysuria, urgency, frequency, hematuria, flank pain and difficulty urinating.  Musculoskeletal: Negative for myalgias, back pain, joint swelling and gait problem.  Skin: Negative.   Allergic/Immunologic: Negative.   Neurological: Negative.  Negative for dizziness, tremors, weakness, light-headedness, numbness and headaches.  Hematological: Negative.  Negative for adenopathy. Does not bruise/bleed easily.  Psychiatric/Behavioral: Negative.        Objective:   Physical Exam  Vitals reviewed. Constitutional: She is oriented to person, place, and time. She appears well-developed and well-nourished. No distress.  HENT:  Head: Normocephalic and atraumatic.  Mouth/Throat: Oropharynx is clear and moist. No oropharyngeal exudate.  Eyes: Conjunctivae are normal. Right eye exhibits no discharge. Left eye exhibits no discharge. No scleral icterus.  Neck: Normal range of motion. Neck supple. No JVD present. No tracheal deviation  present. No thyromegaly present.  Cardiovascular: Normal rate, regular rhythm, normal heart sounds and intact distal pulses.  Exam reveals no gallop and no friction rub.   No murmur heard. Pulmonary/Chest: Effort normal and breath sounds normal. No stridor. No respiratory distress. She has no wheezes. She has no rales. She exhibits no tenderness.  Abdominal: Soft. Bowel sounds are normal. She exhibits no distension and no mass. There is no tenderness. There is no rebound and no guarding.  Musculoskeletal: Normal range of motion. She exhibits no edema and no tenderness.       Cervical back: Normal. She exhibits normal range of motion, no tenderness, no bony tenderness, no swelling, no edema, no deformity, no laceration, no pain, no spasm and normal pulse.  Lymphadenopathy:    She has no cervical adenopathy.  Neurological: She is alert and oriented to person, place, and time. She has normal strength. She displays no atrophy, no tremor and normal reflexes. No cranial nerve deficit or sensory deficit. She exhibits normal muscle tone. She displays a negative Romberg sign. She displays no seizure activity. Coordination and gait normal. She displays no Babinski's sign on the right side. She displays no Babinski's sign on the left side.  Reflex Scores:      Tricep reflexes are 1+ on the right side and 1+ on the left side.      Bicep reflexes are 1+ on the right side and 1+ on the left side.      Brachioradialis reflexes are 1+ on the right side and 1+ on the left side.      Patellar reflexes are 1+ on the right side and 1+ on the left side.      Achilles reflexes are 1+ on the right side and 1+ on the left  side. Skin: Skin is warm and dry. No rash noted. She is not diaphoretic. No erythema. No pallor.  Psychiatric: She has a normal mood and affect. Her behavior is normal. Judgment and thought content normal.      Lab Results  Component Value Date   WBC 8.2 03/17/2012   HGB 12.3 03/17/2012   HCT 37.3  03/17/2012   PLT 204.0 03/17/2012   GLUCOSE 188* 03/17/2012   CHOL 169 05/13/2011   TRIG 210.0* 05/13/2011   HDL 59.30 05/13/2011   LDLDIRECT 91.4 05/13/2011   ALT 30 02/03/2012   AST 29 02/03/2012   NA 136 03/17/2012   K 4.3 03/17/2012   CL 102 03/17/2012   CREATININE 2.2* 03/17/2012   BUN 33* 03/17/2012   CO2 25 03/17/2012   TSH 1.03 03/17/2012   HGBA1C 8.9* 02/03/2012   MICROALBUR 11.8* 02/08/2012      Assessment & Plan:

## 2012-05-03 NOTE — Patient Instructions (Signed)
Chronic Renal Insufficiency Chronic renal insufficiency (also called kidney failure) occurs when there is kidney damage done. The damage prevents the kidneys from working like they should.  The kidneys do many important things. They:  Filter waste out of the blood.  Regulate the amount of water and various salts in the blood stream.  Produce chemicals that:  Prompt the bone marrow to make red blood cells.  Regulate blood pressure.  Keep calcium in balance throughout the bones and the body. When the kidneys are damaged, they can no longer filter waste products out of the blood. These substances build up in the blood, causing illness.  CAUSES   Diabetes.  High blood pressure.  Glomerular diseases: Conditions that damage the tiny blood vessels (glomeruli) within the kidneys, such as:  Membranous nephropathy.  IgA nephropathy.  Focal segmental glomerulosclerosis.  Poisons (such as overdoses or misuse of acetaminophen or NSAIDS, or exposure to other toxic substances).  Kidney injuries.  Kidney cancer or cancer that spreads to the kidney.  Medications such as NSAIDs. These problems are rare.  Kidney stones.  Alport disease.  Polycystic kidneys. SYMPTOMS  Most people do not notice symptoms of kidney failure until their kidney function drops below about 30-40% of normal. Symptoms can include:  Weakness.  Tiredness.  Frequent urination.  Intense need to urinate.  Excess bruising.  Low urine production.  Blood in the urine.  Pain in the kidney area.  Feeling sick to your stomach (nausea).  Vomiting.  Unusual bleeding.  Numbness in hands and feet.  Swelling in legs, arms and face.  Confusion. DIAGNOSIS  Your caregiver will look for signs of kidney failure. Tests to diagnose kidney failure may include:  Urine tests: May reveal the presence of blood, protein or sugar.  Blood tests: May show low red blood cell count (anemia) or high levels of waste  products (BUN and creatinine) that are normally filtered out of the bloodstream by the kidneys.  Imaging tests  These are tests that create pictures of the organs inside the abdomen, such as the kidneys. They may reveal masses growing in the kidneys or blockages to the flow of urine. Possible imaging tests may include:  Ultrasound.  CT scan.  MRI.  Intravenous pyelogram or IVP. This is a test that involves injecting dye into the bloodstream and then taking a series of x-rays of the kidneys. This allows the kidneys and other parts of the urinary system to be viewed more clearly.  Kidney biopsy  A small sample of kidney is removed using a special needle. The sample is examined for abnormalities under a microscope. TREATMENT  Chronic kidney failure cannot usually be cured. The various symptoms are treated, and measures are taken to avoid further kidney damage. Treatment for mild to moderate kidney failure may include:  Medication for high blood pressure.  Good control of diabetes.  Medication and diet change to improve anemia.  A low-sodium, low-potassium, low-protein and/or low-cholesterol diet.  Limiting the quantity of liquids in the diet. Treatment for more severe kidney failure may require:  Dialysis  Mechanical methods of filtering the blood.  Kidney transplant  An operation that removes the diseased kidney and replaces it with a donated kidney. HOME CARE INSTRUCTIONS   Take medication as told by your caregiver.  Quit smoking if you are a smoker. Talk to your caregiver about a smoking cessation program.  Follow your prescribed diet.  If you are prescribed vitamins, take them as told. SEEK IMMEDIATE MEDICAL CARE IF:  You start to produce less urine.  You notice blood in your urine.  You have increased pain.  You have increased weakness, fatigue or confusion.  You notice new swelling.  You develop a fever.  You feel that you are having side effects of medicines  prescribed. Document Released: 10/14/2007 Document Revised: 03/29/2011 Document Reviewed: 01/26/2010 [facility] Patient Information 2013 [place].

## 2012-05-04 ENCOUNTER — Encounter: Payer: Self-pay | Admitting: Internal Medicine

## 2012-05-04 LAB — HEMOGLOBIN A1C: Hgb A1c MFr Bld: 9.3 % — ABNORMAL HIGH (ref 4.6–6.5)

## 2012-05-05 ENCOUNTER — Encounter: Payer: Self-pay | Admitting: Internal Medicine

## 2012-05-05 MED ORDER — COLESEVELAM HCL 3.75 G PO PACK: 1.0000 | PACK | Freq: Every day | ORAL | Status: DC

## 2012-05-05 NOTE — Addendum Note (Signed)
Addended by: Janith Lima on: 05/05/2012 03:55 PM   Modules accepted: Orders

## 2012-05-05 NOTE — Assessment & Plan Note (Signed)
Her renal function appears to be declining I have asked her to touch base with her nephrologist

## 2012-05-05 NOTE — Assessment & Plan Note (Addendum)
Her blood sugars are too high, I have asked her to start welchol  I have asked her to f/up with Endo

## 2012-05-05 NOTE — Assessment & Plan Note (Signed)
She has no s/s of radiculopathy She can continue taking lyrica as needed

## 2012-05-05 NOTE — Assessment & Plan Note (Signed)
She is doing well on crestor 

## 2012-05-07 ENCOUNTER — Other Ambulatory Visit: Payer: Self-pay | Admitting: Internal Medicine

## 2012-05-09 ENCOUNTER — Encounter: Payer: Self-pay | Admitting: Internal Medicine

## 2012-05-09 ENCOUNTER — Ambulatory Visit (INDEPENDENT_AMBULATORY_CARE_PROVIDER_SITE_OTHER): Payer: Medicare Other | Admitting: Internal Medicine

## 2012-05-09 ENCOUNTER — Ambulatory Visit: Payer: Medicare Other | Admitting: Cardiovascular Disease

## 2012-05-09 VITALS — BP 136/78 | HR 77 | Temp 97.3°F | Ht 64.0 in | Wt 219.4 lb

## 2012-05-09 DIAGNOSIS — G8929 Other chronic pain: Secondary | ICD-10-CM

## 2012-05-09 DIAGNOSIS — J302 Other seasonal allergic rhinitis: Secondary | ICD-10-CM

## 2012-05-09 DIAGNOSIS — R0789 Other chest pain: Secondary | ICD-10-CM

## 2012-05-09 DIAGNOSIS — J069 Acute upper respiratory infection, unspecified: Secondary | ICD-10-CM

## 2012-05-09 DIAGNOSIS — J45909 Unspecified asthma, uncomplicated: Secondary | ICD-10-CM | POA: Diagnosis not present

## 2012-05-09 DIAGNOSIS — R071 Chest pain on breathing: Secondary | ICD-10-CM

## 2012-05-09 DIAGNOSIS — J309 Allergic rhinitis, unspecified: Secondary | ICD-10-CM | POA: Diagnosis not present

## 2012-05-09 DIAGNOSIS — J3089 Other allergic rhinitis: Secondary | ICD-10-CM

## 2012-05-09 MED ORDER — IPRATROPIUM BROMIDE 0.03 % NA SOLN: NASAL | Status: DC

## 2012-05-09 NOTE — Progress Notes (Signed)
10/29/10- Alllergic rhinitis, asthma, hx urticaria, complicated by DM, renal insufficiency Last here- Dec 05, 2009. Had flu shot She continues to stay with her daughter in [place] for months at a time.  Over the last month has had typical for this season, chest tightness and nasal congestion with wheeze. Qvar not sufficient and she ran out of Singulair. While in [place] she saw cardiologist for dyspnea who did stress test. He recommended she get a sleep study.Treating for HBP and renal insufficiency She is aware of snore, but alone. Denies awareness of daytime sleepiness. Takes alprazolam for occasional insomnia.   01/05/11- Alllergic rhinitis, asthma, hx urticaria, complicated by DM, renal insufficiency Husband here Nose became congested overnight but chest ok. Got saline sinus rinse.  Asthma-preferred Qvar to [facility].  Sleep- wakes frequently unless she takes [place]. She doesn't mind the limb jerks. Discussed meds and oxygen for sleep.  10/05/11-  23 yoF former smoker followed for alllergic rhinitis, asthma/ COPD, hx urticaria, complicated by DM, renal insufficiency, GERD           husband here ACUTE VISIT: Increased SOB worse with activity-worse at night with sleeping--wheezing. Has had increase in weight. Dry cough mostly-through out the day has productive cough-white in color. Never got the sleep study scheduled 11/20/10 last year. CT chest 2005 reported emphysema. Over the past 3 months has had more wheezing shortness of breath. These wake her at night and she feels that she fights for her breath. Wheeze with walking. Has lost some weight. Using Vicks VapoRub which helps. Out of Ventolin which also helped. Noticing nasal congestion. Denies fever, purulent discharge or sore throat. Some swelling in her feet she attributes to gout. Cardiologist in [place] told her "nothing wrong with her heart".  02/11/12- 37 yoF former smoker followed for alllergic rhinitis, asthma/ COPD, hx urticaria,  complicated by DM, renal insufficiency, GERD           husband here FOLLOWS FOR: having SOB, wheezing, cough-productive-white in color, and chest congestion. Review PFT and 6MW test with patient Had flu like illness without fever, after flu shot, slowly resolving. This week a deep breath hurts midsternum. Reports stridor with increased physical exertion. PFT 10/26/2011 mild obstructive airways disease, small airways, insignificant response to bronchodilator. Diffusion moderately reduced. FVC 2.46/92%, FEV1 1.76/94%, FEV1/FVC 0.71, FEF 25/75% 1.13/52%. TLC 86%, DLCO 46%. 6 MWT-99%, 97%, 99%, 297.5 m with complaint of right calf pain but no oxygen limitation. CXR 10/06/11 IMPRESSION:  No acute abnormalities.  Original Report Authenticated By: Burnetta Sabin, M.D.  03/29/12- 39 yoF former smoker followed for alllergic rhinitis, asthma/ COPD, hx urticaria, complicated by DM, renal insufficiency, GERD         Son here.   ACUTE VISIT: still having trouble breathing; still having soreness in chest that raidates to shoulders and back-pulls from sore spot when sneezing. Also, back of tongue not helping with swallowing food as it should; jaw tires easily. Head congestion;-no color when blowing nose.  At last visit we had given Z-Pak and cough syrup. That may have helped at the time. Her left upper anterior chest wall sore spot persists, increased by sneezing or lifting her left arm which causes sharp pain across anterior chest. No trauma. Chest CT showed no obvious cause. She does not describe anything anginal. Now with acute upper respiratory complaints of right ear pain and nasal congestion. She restarted Nasonex 3 days ago. CT chest 02/25/12- images reviewed with her IMPRESSION:  1. No acute findings in the thorax  to account for the patient's  symptoms.  2. Mild diffuse bronchial wall thickening with moderate  centrilobular emphysema; imaging findings compatible with  underlying COPD.  3. Atherosclerosis,  including left main and three-vessel coronary  artery disease. Assessment for potential risk factor  modification, dietary therapy or pharmacologic therapy may be  warranted, if clinically indicated.  4. No evidence of substernal thyroid.  5. Small hiatal hernia.  6. Additional incidental findings, as above.  Original Report Authenticated By: Vinnie Langton, M.D.  05/09/12- 30 yoF former smoker followed for alllergic rhinitis, asthma/ COPD, hx urticaria, complicated by DM, renal insufficiency, GERD         Son here Head congestion began last night, caught a cold from husband. Increasing cough with near tussive syncope. Sharp tussive pains are still present left upper anterior chest. Possible fever x1 day with yellow phlegm. She likes the way Dymista nasal spray work but didn't like the taste.  ROS- see HPI Constitutional:   No-   weight loss, night sweats, fevers, chills, fatigue, lassitude. HEENT:   No-  headaches, difficulty swallowing, tooth/dental problems, sore throat,       No-  sneezing, itching, ear ache, +nasal congestion, post nasal drip,  CV:  +noncardiac chest pain, no-orthopnea, PND, swelling in lower extremities, anasarca, +dizziness, No-palpitations Resp: + shortness of breath with exertion or at rest.              +  productive cough,  No non-productive cough,  No-  coughing up of blood.              +  change in color of mucus.  No- wheezing.   Skin: No-   rash or lesions. GI:  No-   heartburn, indigestion, abdominal pain, nausea, vomiting,  GU: MS:  +  joint pain or swelling.  . Neuro- nothing unusual Psych:  No- change in mood or affect. No depression or anxiety.  No memory loss.  OBJ General- Alert, Oriented, Affect-appropriate, Distress- none acute, Obese, talkative . Wearing a paper facemask today .  Skin- rash-none, lesions- none, excoriation- none Lymphadenopathy- none Head- atraumatic            Eyes- Gross vision intact, PERRLA, conjunctivae clear  secretions            Ears- Hearing, canals-normal. Nonobstructive cerumen.            Nose- +wet turbinate edema, no-Septal dev, +mucus bridging, no-polyps, erosion, perforation             Throat- Mallampati III , mucosa clear , drainage- none, tonsils- atrophic Neck- flexible , trachea midline, no stridor , thyroid nl, carotid no bruit Chest - symmetrical excursion , unlabored           Heart/CV- RRR , no murmur , no gallop  , no rub, nl s1 s2                           - JVD- none , +trace edema, no-stasis changes- none, varices- none           Lung- clear to P&A, wheeze+ trace, cough- none , dullness-none, rub- none           Chest wall-  Abd-  Br/ Gen/ Rectal- Not done, not indicated Extrem- cyanosis- none, clubbing, none, atrophy- none, strength- nl. these Neuro- grossly intact to observation

## 2012-05-09 NOTE — Patient Instructions (Addendum)
Neb nasal neo  Depo 80  Script for ipratropium nasal spray sent, to use as needed instead of Dymista

## 2012-05-10 DIAGNOSIS — J45909 Unspecified asthma, uncomplicated: Secondary | ICD-10-CM | POA: Diagnosis not present

## 2012-05-10 DIAGNOSIS — J309 Allergic rhinitis, unspecified: Secondary | ICD-10-CM

## 2012-05-10 MED ORDER — PHENYLEPHRINE HCL 1 % NA SOLN
3.0000 [drp] | Freq: Once | NASAL | Status: AC
  Administered 2012-05-10: 3 [drp] via NASAL

## 2012-05-10 MED ORDER — METHYLPREDNISOLONE ACETATE 80 MG/ML IJ SUSP
80.0000 mg | Freq: Once | INTRAMUSCULAR | Status: AC
  Administered 2012-05-10: 80 mg via INTRAMUSCULAR

## 2012-05-11 ENCOUNTER — Ambulatory Visit: Payer: Medicare Other | Admitting: Internal Medicine

## 2012-05-12 ENCOUNTER — Ambulatory Visit: Payer: Medicare Other | Admitting: Internal Medicine

## 2012-05-15 DIAGNOSIS — G8929 Other chronic pain: Secondary | ICD-10-CM | POA: Insufficient documentation

## 2012-05-15 DIAGNOSIS — J069 Acute upper respiratory infection, unspecified: Secondary | ICD-10-CM | POA: Insufficient documentation

## 2012-05-15 NOTE — Assessment & Plan Note (Signed)
This is acting like tussive chest wall pain although it's chronic. Imaging has not shown an obvious cause and we have discussed possible pinched nerve.

## 2012-05-15 NOTE — Assessment & Plan Note (Signed)
Acute bronchitic exacerbation consistent with a viral infection caught from her husband.

## 2012-05-15 NOTE — Assessment & Plan Note (Signed)
We will treat the upper respiratory symptoms and see if the lower respiratory symptoms subsequently improved. Plan-nasal nebulizer decongestant, Depo-Medrol, ipratropium 0.03% nasal spray

## 2012-05-19 ENCOUNTER — Ambulatory Visit: Payer: Medicare Other | Admitting: Endocrinology

## 2012-05-22 ENCOUNTER — Other Ambulatory Visit: Payer: Self-pay | Admitting: Internal Medicine

## 2012-05-22 ENCOUNTER — Encounter: Payer: Self-pay | Admitting: Internal Medicine

## 2012-05-22 ENCOUNTER — Ambulatory Visit (INDEPENDENT_AMBULATORY_CARE_PROVIDER_SITE_OTHER): Payer: Medicare Other | Admitting: Internal Medicine

## 2012-05-22 VITALS — BP 122/62 | HR 56 | Temp 97.6°F | Resp 10 | Wt 212.0 lb

## 2012-05-22 DIAGNOSIS — E1129 Type 2 diabetes mellitus with other diabetic kidney complication: Secondary | ICD-10-CM | POA: Diagnosis not present

## 2012-05-22 DIAGNOSIS — E1165 Type 2 diabetes mellitus with hyperglycemia: Secondary | ICD-10-CM | POA: Diagnosis not present

## 2012-05-22 MED ORDER — INSULIN ASPART 100 UNIT/ML FLEXPEN: PEN_INJECTOR | SUBCUTANEOUS | Status: DC

## 2012-05-22 MED ORDER — "PEN NEEDLES 5/16"" 31G X 8 MM MISC": Status: DC

## 2012-05-22 MED ORDER — GLUCOSE BLOOD VI STRP: ORAL_STRIP | Status: DC

## 2012-05-22 NOTE — Progress Notes (Signed)
Subjective:     Patient ID: Megan [name], female   DOB: 26-Sep-1940, 72 y.o.   MRN: [id number]  HPI Megan [name] is a pleasant 72 year old woman returning for f/u for DM2, dx 1995, insulin-dependent, uncontrolled, with complications (chronic kidney disease). Her son accompanies her today, and Megan [name] takes notes.  Latest HbA1C: Lab Results  Component Value Date   HGBA1C 9.3* 05/03/2012   She takes: - 50 units Lantus qhs - started insulin 2013 - Januvia 100 mg - Invokana 100 mg, prev. on Glipizide She was on Metformin and that helped tremendously - but she has CKD and she was taken off Metformin. She took Welchol in the past, now off.  She checks sugars but does not bring a log.  - am: 160 >> now: 130-165 - hs: 240 >> now: 200-235 No lows, has hypoglycemia awareness.  She tells me she can skip meals until 3-4 pm. Lowest sugar 121.   No neuropathy. Last eye exam 2 mo ago - Megan [name]. 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 She has a Physiological scientist (she was seeing her 3x a week in the past, not in last 3 months - will go starting [employer]) and a nutritionist at [facility] (Megan [name] - [facility]); also has a health psychologist.   She has been told she has hypothyroidism in the past and was told to increase the iodine intake. She had 2 low TSH levels after that, but after she decreased back her iodine intake iodine, TSH normalized: Lab Results  Component Value Date   TSH 0.72 05/03/2012   TSH 1.03 03/17/2012   TSH 0.15* 02/08/2012   TSH 0.33* 02/03/2012   TSH 0.50 05/13/2011  She is not on thyroid medication. She has a FH of Graves ds in 2 aunts and a cousin.   At last visit, she complained of mid chest pain when she took a deep breath in and also shortness of breath. She was seen by a Megan [name] with pulmonary medicine, and PFTs were not abnormal. She also has GERD for which she takes Protonix. She has gout, for which Megan [name] takes Colcrys when needed. Has a  h/o stroke in 2004 (no residual deficits). She c/o gaining weight in her abdomen mainly, so that in impedes her breathing (PFTs normal - Megan [name]).   I reviewed pt's medications, allergies, PMH, social hx, family hx and no changes required, except her BUN and creatinine worsened as below: Lab Results  Component Value Date   BUN 41* 05/03/2012   BUN 33* 03/17/2012   BUN 28* 02/03/2012   BUN 37* 05/13/2011   CREATININE 2.6* 05/03/2012   CREATININE 2.2* 03/17/2012   CREATININE 2.2* 02/03/2012   CREATININE 1.7* 05/13/2011  also, Effexor was tapered down and then stopped, and she started Lyrica. She also started Lasix.  Review of Systems Constitutional: + weight gain, + fatigue, hot flushes, poor sleep Eyes: no blurry vision, no xerophthalmia ENT: no sore throat, no nodules palpated in throat, no dysphagia/ no odynophagia, no hoarseness Cardiovascular: no CP - but pinpoints to mid-chest where she feels discomfort when takes a deep breath/+ SOB/palpitations/leg swelling Respiratory: + cough/+ SOB/+ wheezing Gastrointestinal: no nausea/no V/+ D Musculoskeletal: + muscle/joint aches Skin: no rashes Neurological: no tremors/numbness/tingling/dizziness Psychiatric: no depression/anxiety  Objective:   Physical Exam BP 122/62  Pulse 56  Temp(Src) 97.6 F (36.4 C) (Oral)  Resp 10  Wt 212 lb (96.163 kg)  BMI 36.37 kg/m2  SpO2 97% Wt Readings from  Last 3 Encounters:  05/22/12 212 lb (96.163 kg)  05/09/12 219 lb 6.4 oz (99.519 kg)  05/03/12 217 lb 3.2 oz (98.521 kg)   Constitutional: overweight, in NAD Eyes: PERRLA, EOMI, no exophthalmos ENT: moist mucous membranes, no thyromegaly, no cervical lymphadenopathy Cardiovascular: RRR, No MRG Respiratory: CTA B Gastrointestinal: abdomen soft, NT, ND, BS+ Musculoskeletal: no deformities, strength intact in all 4 Skin: moist, warm, no rashes Neurological: no tremor with outstretched hands, DTR normal in all 4  Assessment:     1. DM2, poorly  controlled, insulin-dependent, with complications - CKD stage 3  2. Low TSH - likely 2/2 increased dietary iodine, resolved    Plan:  1. Diabetes mellitus type 2 - The patient has insulin-dependent DM2, with the latest hemoglobin A1c at 9.3%, worsening, likely due to lack of exercise and relaxing her diet - She checks her sugars twice a day, but unfortunately, she does not bring her log - she still maintains a lower fasting blood sugar and staircase effect, with higher blood sugars before bedtime - We discussed about the fact that we need to stop Invokana, as her kidney function is low - Will continue Januvia for now - continue Lantus at the same dose of 50 units nightly - Will add NovoLog insulin with the biggest meal of the day, which is dinner, as follows: Inject under skin once a day: 5 units  before dinner. Add the following correction: - 170-200: +1 units - 201-230: +2 units - 231-260: +3 units - 261-290: +4 units - 291-320: +5 units ->320: +6 units - I will see her back in a month to evaluate her sugar control  - I encouraged her to continue to see her personal trainer and restart regular exercise - I also encouraged her to include 3 meals a day, not to skip breakfast. Megan [name] is telling me that she is waking up in, so she cannot eat. In this case, I encouraged her to still have 3 meals, 2 smaller and may be one larger. 6  2. Low TSH - normalized TFTs after decreasing her iodine intake back to normal

## 2012-05-22 NOTE — Patient Instructions (Addendum)
Please return in 1 month with your sugar log. Check sugars in am, 15 minutes before dinner, and at bedtime (3x a day) Please add Novolog insulin at 5 units to be taken 15 minutes before dinner. Add the following Novolog correction: - 170-200: +1 units - 201-230: +2 units - 231-260: +3 units - 261-290: +4 units - 291-320: +5 units ->320: +6 units

## 2012-05-30 ENCOUNTER — Ambulatory Visit: Payer: Medicare Other | Admitting: Internal Medicine

## 2012-06-14 ENCOUNTER — Telehealth: Payer: Self-pay | Admitting: *Deleted

## 2012-06-14 ENCOUNTER — Other Ambulatory Visit (INDEPENDENT_AMBULATORY_CARE_PROVIDER_SITE_OTHER): Payer: Medicare Other

## 2012-06-14 ENCOUNTER — Encounter: Payer: Self-pay | Admitting: Physician Assistant

## 2012-06-14 ENCOUNTER — Ambulatory Visit (INDEPENDENT_AMBULATORY_CARE_PROVIDER_SITE_OTHER): Payer: Medicare Other | Admitting: Physician Assistant

## 2012-06-14 ENCOUNTER — Encounter: Payer: Self-pay | Admitting: Internal Medicine

## 2012-06-14 VITALS — BP 118/78 | HR 69 | Ht 64.0 in | Wt 219.0 lb

## 2012-06-14 DIAGNOSIS — R1084 Generalized abdominal pain: Secondary | ICD-10-CM

## 2012-06-14 DIAGNOSIS — Z86010 Personal history of colonic polyps: Secondary | ICD-10-CM | POA: Diagnosis not present

## 2012-06-14 DIAGNOSIS — Z7901 Long term (current) use of anticoagulants: Secondary | ICD-10-CM

## 2012-06-14 DIAGNOSIS — R198 Other specified symptoms and signs involving the digestive system and abdomen: Secondary | ICD-10-CM

## 2012-06-14 DIAGNOSIS — Z800 Family history of malignant neoplasm of digestive organs: Secondary | ICD-10-CM

## 2012-06-14 DIAGNOSIS — R194 Change in bowel habit: Secondary | ICD-10-CM

## 2012-06-14 DIAGNOSIS — Z860101 Personal history of adenomatous and serrated colon polyps: Secondary | ICD-10-CM

## 2012-06-14 DIAGNOSIS — I2510 Atherosclerotic heart disease of native coronary artery without angina pectoris: Secondary | ICD-10-CM | POA: Diagnosis not present

## 2012-06-14 LAB — BASIC METABOLIC PANEL
CO2: 27 mEq/L (ref 19–32)
Glucose, Bld: 126 mg/dL — ABNORMAL HIGH (ref 70–99)
Potassium: 4.6 mEq/L (ref 3.5–5.1)
Sodium: 137 mEq/L (ref 135–145)

## 2012-06-14 MED ORDER — MOVIPREP 100 G PO SOLR: 1.0000 | Freq: Once | ORAL | Status: DC

## 2012-06-14 NOTE — Telephone Encounter (Signed)
Dr Ronnald Ramp faxed me back a letter which I will have scanned in Epic.  He stated the patient can come off the Plavix for 5 days and restart it 2 days after the procedure.  Pt informed and understood instructions.

## 2012-06-14 NOTE — Patient Instructions (Addendum)
Please go to the basement level to have your labs drawn.  Take Align probiotic, 1 capsule daily.  We have given you a coupon for the Align and the Moviprep. We sent the prescription for [facility]. We will call you once we hear back from Dr. Ronnald Ramp.

## 2012-06-14 NOTE — Telephone Encounter (Signed)
I called the pt to inform her that the Dr. Ronnald Ramp wants her to hold the Plavix for 5 days prior to the procedure date of 07-05-2012.  He wants her to restart it 2 days later on 07-07-2012.   Patient understood the instructions.

## 2012-06-14 NOTE — Progress Notes (Signed)
Subjective:    Patient ID: Megan [name], female    DOB: 04-05-1940, 72 y.o.   MRN: [id number]  HPI Megan [name] is a 72 year old African female known to Dr. Delfin [name]. She has history of adenomatous colon polyps and family history of colon cancer in a parent. She last had colonoscopy here in 2009 and at that time had one hyperplastic polyp removed. She had adenomatous colon polyps on colonoscopy in 2005. Patient also had a colonoscopy done in October of 2010 in [place] at which time she had been having chronic problems with diarrhea. She was noted to have diverticulosis but no polyps. Random biopsies were done which were negative for evidence of microscopic colitis. She comes in today to discuss followup colonoscopy and also with complaints of abdominal cramping and changes in her bowel habits . She says she has frequent bouts of diarrhea with liquid stool with multiple bowel movements in one day and then may go to hard stools on another day. She had been taking WelChol for multiple months and says she stopped this about 6 months ago because it causes such back problems with diarrhea but that her bowels haven't been normal sense. She has not noted any melena or hematochezia. She complains of intermittent mid abdominal cramping and pain as well. She also mentions that she's been having extremity cramping both in her upper and lower extremities and had been started on Lasix about a month ago. She says she also feels that there is some issue in her lower bowel or rectum because she has incomplete evacuation frequently. She is maintained on chronic Plavix for a history of [name], also has history of COPD hypertension and chronic kidney disease as well as adult-onset diabetes mellitus.    Review of Systems  Constitutional: Negative.   HENT: Negative.   Eyes: Negative.   Respiratory: Negative.   Cardiovascular: Negative.   Gastrointestinal: Positive for abdominal pain and diarrhea.  Endocrine:  Negative.   Genitourinary: Negative.   Musculoskeletal: Negative.   Skin: Negative.   Allergic/Immunologic: Negative.   Neurological: Negative.   Hematological: Negative.   Psychiatric/Behavioral: Negative.    Outpatient Prescriptions Prior to Visit  Medication Sig Dispense Refill  . albuterol (PROVENTIL HFA;VENTOLIN HFA) 108 (90 BASE) MCG/ACT inhaler Inhale 2 puffs into the lungs every 4 (four) hours as needed.      Marland Kitchen amLODipine (NORVASC) 10 MG tablet Take 1 tablet by mouth daily.      Marland Kitchen BENICAR 40 MG tablet Take 40 mg by mouth daily.       Marland Kitchen COLCRYS 0.6 MG tablet Take 1 tablet by mouth daily as needed.       . CRESTOR 10 MG tablet TAKE 1 TABLET (10 MG TOTAL) BY MOUTH DAILY.  90 tablet  3  . Fluticasone-Salmeterol (ADVAIR DISKUS) 100-50 MCG/DOSE AEPB Inhale 1 puff into the lungs 2 (two) times daily.  60 each  11  . furosemide (LASIX) 40 MG tablet Take 1 tablet by mouth daily.      Marland Kitchen glucose blood (ONE TOUCH ULTRA TEST) test strip USE tID  300 each  3  . insulin aspart (NOVOLOG FLEXPEN) 100 unit/mL SOLN FlexPen Inject under skin up to 10 units daily  15 mL  2  . insulin glargine (LANTUS SOLOSTAR) 100 UNIT/ML injection Inject 25 Units into the skin 2 (two) times daily.  10 mL  11  . Insulin Pen Needle (PEN NEEDLES 31GX5/16") 31G X 8 MM MISC Use bid  200 each  3  . ipratropium (ATROVENT) 0.03 % nasal spray 1-2 puffs each nostril up to every 8 hours if needed  30 mL  12  . labetalol (NORMODYNE) 200 MG tablet Take 1 tablet by mouth 2 (two) times daily.      . pantoprazole (PROTONIX) 40 MG tablet Take 1 tablet by mouth daily.      Marland Kitchen PLAVIX 75 MG tablet TAKE 1 TABLET BY MOUTH ONCE A DAY  90 tablet  2  . sitaGLIPtin (JANUVIA) 100 MG tablet Take 100 mg by mouth daily.        Marland Kitchen zolpidem (AMBIEN) 5 MG tablet Take 1 tablet (5 mg total) by mouth at bedtime as needed.  30 tablet  5  . pregabalin (LYRICA) 50 MG capsule Take 1 capsule (50 mg total) by mouth 3 (three) times daily.  42 capsule  0  .  promethazine-codeine (PHENERGAN WITH CODEINE) 6.25-10 MG/5ML syrup Take 5 mLs by mouth every 6 (six) hours as needed for cough.       No facility-administered medications prior to visit.   Allergies  Allergen Reactions  . Ivp Dye (Iodinated Diagnostic Agents) Other (See Comments)    Nausea and SOB per patient  . Pioglitazone     REACTION: weight gain  . Sulfonamide Derivatives     REACTION: bLISTERS   Patient Active Problem List   Diagnosis Date Noted  . Family hx of colon cancer 06/14/2012  . Hx of adenomatous colonic polyps 06/14/2012  . Acute upper respiratory infections of unspecified site 05/15/2012  . Chest wall pain, chronic 05/15/2012  . Neck pain on right side 05/03/2012  . Coronary atherosclerosis of native coronary artery 03/17/2012  . Edema 03/17/2012  . Other screening mammogram 11/05/2010  . 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 UTI 12/24/2009  . HYPERLIPIDEMIA 10/30/2009  . Chronic renal insufficiency, stage III (moderate) 10/30/2009  . GERD 08/20/2008  . ESOPHAGEAL STRICTURE 03/29/2008  . Seasonal and perennial allergic rhinitis 01/18/2007  . Type II or unspecified type diabetes mellitus with renal manifestations, uncontrolled(250.42) 08/06/2006  . GOUT 08/06/2006  . Hypertension, renal disease 08/06/2006  . Asthma with bronchitis 08/06/2006  . CEREBROVASCULAR ACCIDENT, HX OF 08/06/2006   History  Substance Use Topics  . Smoking status: Former Smoker -- 1.00 packs/day for 30 years    Types: Cigarettes    Quit date: 01/18/2002  . Smokeless tobacco: Never Used  . Alcohol Use: No      family history includes Colon cancer in an unspecified family member; Colon polyps in her father; Diabetes in an unspecified family member; GER disease in her father; Heart failure in her father; and Ovarian cancer in her mother.  There is no history of CAD.  Objective:   Physical Exam well-developed older African American female in no acute distress blood  pressure 118/78 pulse 69 height 5 foot 4 weight 219. HEENT; nontraumatic normocephalic EOMI PERRLA sclera, Neck;Supple no JVD, Cardiovascular; regular rate and rhythm with S1-S2 no murmur or gallop, Pulmonary; clear bilaterally, Abdomen; large soft she does have a ventral hernia in the midline above the umbilicus which is reducible no palpable mass or hepatosplenomegaly and she has no focal tenderness, bowel sounds are present, Rectal; exam not done, Extremities ;no clubbing cyanosis or edema skin warm and dry, Psych; mood and affect normal and appropriate        Assessment & Plan:  #53  72 year old female with a several month history of change in bowel habit with frequent bouts of  diarrhea alternating with days of constipation and associated abdominal cramping very Etiology is unclear. This may be more of a functional issue as previous random biopsies done in 2010 were unremarkable . Rule out underlying colitis  Or occult colon lesion #2 personal history of adenomatous colon polyps-last colonoscopy done 2010 in [place] no polyps found #3 Family  history of colon cancer in parent #4 chronic antiplatelet therapy with Plavix and aspirin #5hx of CVA 2004 #6 hypertension #7 chronic kidney disease  #8  diabetes mellitus #9 COPD  Plan; start a line one by mouth daily We'll check  BMET and magnesium level with complaints of extremity cramping and recent initiation of diuretic therapy Schedule for colonoscopy with Dr. Ranell Patrick cedar discussed in detail with the patient and her husband and they are agreeable to proceed We will obtain consent from Dr. Ronnald Ramp for patient to stop Plavix 5 days prior to her procedure-relative risk benefit of holding antiplatelet therapy also discussed

## 2012-06-14 NOTE — Progress Notes (Signed)
Reviewed and agree.

## 2012-06-16 ENCOUNTER — Other Ambulatory Visit (INDEPENDENT_AMBULATORY_CARE_PROVIDER_SITE_OTHER): Payer: Medicare Other

## 2012-06-16 ENCOUNTER — Encounter: Payer: Self-pay | Admitting: Internal Medicine

## 2012-06-16 ENCOUNTER — Ambulatory Visit (INDEPENDENT_AMBULATORY_CARE_PROVIDER_SITE_OTHER): Payer: Medicare Other | Admitting: Internal Medicine

## 2012-06-16 VITALS — BP 120/48 | HR 67 | Temp 97.1°F | Resp 16 | Wt 218.5 lb

## 2012-06-16 DIAGNOSIS — K760 Fatty (change of) liver, not elsewhere classified: Secondary | ICD-10-CM

## 2012-06-16 DIAGNOSIS — K7689 Other specified diseases of liver: Secondary | ICD-10-CM

## 2012-06-16 DIAGNOSIS — I129 Hypertensive chronic kidney disease with stage 1 through stage 4 chronic kidney disease, or unspecified chronic kidney disease: Secondary | ICD-10-CM

## 2012-06-16 DIAGNOSIS — E559 Vitamin D deficiency, unspecified: Secondary | ICD-10-CM

## 2012-06-16 DIAGNOSIS — E785 Hyperlipidemia, unspecified: Secondary | ICD-10-CM

## 2012-06-16 DIAGNOSIS — I10 Essential (primary) hypertension: Secondary | ICD-10-CM | POA: Diagnosis not present

## 2012-06-16 DIAGNOSIS — I120 Hypertensive chronic kidney disease with stage 5 chronic kidney disease or end stage renal disease: Secondary | ICD-10-CM

## 2012-06-16 DIAGNOSIS — E1165 Type 2 diabetes mellitus with hyperglycemia: Secondary | ICD-10-CM

## 2012-06-16 DIAGNOSIS — IMO0001 Reserved for inherently not codable concepts without codable children: Secondary | ICD-10-CM

## 2012-06-16 DIAGNOSIS — E1129 Type 2 diabetes mellitus with other diabetic kidney complication: Secondary | ICD-10-CM

## 2012-06-16 DIAGNOSIS — N259 Disorder resulting from impaired renal tubular function, unspecified: Secondary | ICD-10-CM

## 2012-06-16 LAB — COMPREHENSIVE METABOLIC PANEL
Albumin: 3.8 g/dL (ref 3.5–5.2)
BUN: 53 mg/dL — ABNORMAL HIGH (ref 6–23)
CO2: 25 mEq/L (ref 19–32)
Calcium: 9.3 mg/dL (ref 8.4–10.5)
Chloride: 106 mEq/L (ref 96–112)
GFR: 22.77 mL/min — ABNORMAL LOW (ref >60.00)
Glucose, Bld: 109 mg/dL — ABNORMAL HIGH (ref 70–99)
Potassium: 4.6 mEq/L (ref 3.5–5.1)
Total Bilirubin: 0.5 mg/dL (ref 0.3–1.2)

## 2012-06-16 LAB — URINALYSIS, ROUTINE W REFLEX MICROSCOPIC
Bilirubin Urine: NEGATIVE
Ketones, ur: NEGATIVE
Urine Glucose: NEGATIVE
Urobilinogen, UA: 0.2 (ref 0.0–1.0)

## 2012-06-16 LAB — HEMOGLOBIN A1C: Hgb A1c MFr Bld: 9.1 % — ABNORMAL HIGH (ref 4.6–6.5)

## 2012-06-16 LAB — SEDIMENTATION RATE: Sed Rate: 37 mm/hr — ABNORMAL HIGH (ref 0–22)

## 2012-06-16 NOTE — Assessment & Plan Note (Signed)
The software blocked me from ordering a Vit D level

## 2012-06-16 NOTE — Assessment & Plan Note (Signed)
She will work on her lifestyle modifications (weight loss, etc.)

## 2012-06-16 NOTE — Assessment & Plan Note (Signed)
Her BP is well controlled 

## 2012-06-16 NOTE — Patient Instructions (Signed)
Hypercholesterolemia High Blood Cholesterol Cholesterol is a white, waxy, fat-like protein needed by your body in small amounts. The liver makes all the cholesterol you need. It is carried from the liver by the blood through the blood vessels. Deposits (plaque) may build up on blood vessel walls. This makes the arteries narrower and stiffer. Plaque increases the risk for heart attack and stroke. You cannot feel your cholesterol level even if it is very high. The only way to know is by a blood test to check your lipid (fats) levels. Once you know your cholesterol levels, you should keep a record of the test results. Work with your caregiver to to keep your levels in the desired range. WHAT THE RESULTS MEAN:  Total cholesterol is a rough measure of all the cholesterol in your blood.  LDL is the so-called bad cholesterol. This is the type that deposits cholesterol in the walls of the arteries. You want this level to be low.  HDL is the good cholesterol because it cleans the arteries and carries the LDL away. You want this level to be high.  Triglycerides are fat that the body can either burn for energy or store. High levels are closely linked to heart disease. DESIRED LEVELS:  Total cholesterol below 200.  LDL below 100 for people at risk, below 70 for very high risk.  HDL above 50 is good, above 60 is best.  Triglycerides below 150. HOW TO LOWER YOUR CHOLESTEROL:  Diet.  Choose fish or white meat chicken and turkey, roasted or baked. Limit fatty cuts of red meat, fried foods, and processed meats, such as sausage and lunch meat.  Eat lots of fresh fruits and vegetables. Choose whole grains, beans, pasta, potatoes and cereals.  Use only small amounts of olive, corn or canola oils. Avoid butter, mayonnaise, shortening or palm kernel oils. Avoid foods with trans-fats.  Use skim/nonfat milk and low-fat/nonfat yogurt and cheeses. Avoid whole milk, cream, ice cream, egg yolks and cheeses.  Healthy desserts include angel food cake, gingersnaps, animal crackers, hard candy, popsicles, and low-fat/nonfat frozen yogurt. Avoid pastries, cakes, pies and cookies.  Exercise.  A regular program helps decrease LDL and raises HDL.  Helps with weight control.  Do things that increase your activity level like gardening, walking, or taking the stairs.  Medication.  May be prescribed by your caregiver to help lowering cholesterol and the risk for heart disease.  You may need medicine even if your levels are normal if you have several risk factors. HOME CARE INSTRUCTIONS   Follow your diet and exercise programs as suggested by your caregiver.  Take medications as directed.  Have blood work done when your caregiver feels it is necessary. MAKE SURE YOU:   Understand these instructions.  Will watch your condition.  Will get help right away if you are not doing well or get worse. Document Released: 01/04/2005 Document Revised: 03/29/2011 Document Reviewed: 06/22/2006 [employer] Patient Information 2014 [employer].  

## 2012-06-16 NOTE — Progress Notes (Signed)
Subjective:    Patient ID: Megan [name], female    DOB: 14-Jun-1940, 72 y.o.   MRN: [id number]  Hyperlipidemia This is a chronic problem. The current episode started more than 1 year ago. The problem is controlled. Recent lipid tests were reviewed and are variable. Exacerbating diseases include diabetes, liver disease and obesity. She has no history of chronic renal disease or nephrotic syndrome. Factors aggravating her hyperlipidemia include fatty foods. Associated symptoms include myalgias. Pertinent negatives include no chest pain, focal sensory loss, focal weakness, leg pain or shortness of breath. Current antihyperlipidemic treatment includes statins. The current treatment provides moderate improvement of lipids. Compliance problems include adherence to exercise, adherence to diet and medication side effects.       Review of Systems  Constitutional: Positive for fatigue. Negative for fever, chills, diaphoresis, activity change, appetite change and unexpected weight change.  HENT: Negative.   Eyes: Negative.   Respiratory: Negative.  Negative for cough, chest tightness, shortness of breath, wheezing and stridor.   Cardiovascular: Negative.  Negative for chest pain, palpitations and leg swelling.  Gastrointestinal: Negative.  Negative for nausea, vomiting, abdominal pain, diarrhea, constipation and blood in stool.  Endocrine: Negative.   Genitourinary: Negative.   Musculoskeletal: Positive for myalgias and arthralgias. Negative for back pain, joint swelling and gait problem.  Skin: Negative.   Allergic/Immunologic: Negative.   Neurological: Negative.  Negative for dizziness, focal weakness, weakness, light-headedness and numbness.  Hematological: Negative.  Negative for adenopathy. Does not bruise/bleed easily.  Psychiatric/Behavioral: Negative.        Objective:   Physical Exam  Vitals reviewed. Constitutional: She is oriented to person, place, and time. She appears well-developed  and well-nourished. No distress.  HENT:  Head: Normocephalic and atraumatic.  Mouth/Throat: Oropharynx is clear and moist. No oropharyngeal exudate.  Eyes: Conjunctivae are normal. Right eye exhibits no discharge. Left eye exhibits no discharge. No scleral icterus.  Neck: Normal range of motion. Neck supple. No JVD present. No tracheal deviation present. No thyromegaly present.  Cardiovascular: Normal rate, regular rhythm, normal heart sounds and intact distal pulses.  Exam reveals no gallop and no friction rub.   No murmur heard. Pulmonary/Chest: Effort normal and breath sounds normal. No stridor. No respiratory distress. She has no rales. She exhibits no tenderness.  Abdominal: Soft. Normal appearance and bowel sounds are normal. She exhibits no distension and no mass. There is no hepatosplenomegaly, splenomegaly or hepatomegaly. There is no tenderness. There is no rebound, no guarding and no CVA tenderness.  Musculoskeletal: Normal range of motion. She exhibits no edema and no tenderness.  Lymphadenopathy:    She has no cervical adenopathy.  Neurological: She is oriented to person, place, and time.  Skin: Skin is warm and dry. No rash noted. She is not diaphoretic. No erythema. No pallor.  Psychiatric: She has a normal mood and affect. Her behavior is normal. Judgment and thought content normal.      Lab Results  Component Value Date   WBC 7.9 05/03/2012   HGB 12.2 05/03/2012   HCT 36.5 05/03/2012   PLT 191.0 05/03/2012   GLUCOSE 126* 06/14/2012   CHOL 151 05/03/2012   TRIG 196.0* 05/03/2012   HDL 45.40 05/03/2012   LDLDIRECT 91.4 05/13/2011   LDLCALC 66 05/03/2012   ALT 45* 05/03/2012   AST 45* 05/03/2012   NA 137 06/14/2012   K 4.6 06/14/2012   CL 105 06/14/2012   CREATININE 2.5* 06/14/2012   BUN 52* 06/14/2012   CO2 27 06/14/2012  TSH 0.72 05/03/2012   HGBA1C 9.3* 05/03/2012   MICROALBUR 11.8* 02/08/2012      Assessment & Plan:

## 2012-06-16 NOTE — Assessment & Plan Note (Signed)
She will stop crestor and colchicine I will check her CK and CMP today to see if she has a myopathy I was not allowed to check her Vit D level today

## 2012-06-18 ENCOUNTER — Encounter: Payer: Self-pay | Admitting: Internal Medicine

## 2012-06-20 ENCOUNTER — Encounter: Payer: Self-pay | Admitting: Internal Medicine

## 2012-06-23 ENCOUNTER — Ambulatory Visit (INDEPENDENT_AMBULATORY_CARE_PROVIDER_SITE_OTHER): Payer: Medicare Other | Admitting: Internal Medicine

## 2012-06-23 ENCOUNTER — Encounter: Payer: Self-pay | Admitting: Internal Medicine

## 2012-06-23 VITALS — BP 124/66 | HR 60 | Temp 97.4°F | Wt 215.0 lb

## 2012-06-23 DIAGNOSIS — E1129 Type 2 diabetes mellitus with other diabetic kidney complication: Secondary | ICD-10-CM | POA: Diagnosis not present

## 2012-06-23 DIAGNOSIS — E1165 Type 2 diabetes mellitus with hyperglycemia: Secondary | ICD-10-CM | POA: Diagnosis not present

## 2012-06-23 NOTE — Progress Notes (Signed)
Subjective:     Patient ID: Megan [name], female   DOB: November 12, 1940, 72 y.o.   MRN: [id number]  HPI Megan [name] is a pleasant 72 year old woman returning for f/u for DM2, dx 1995, insulin-dependent, uncontrolled, with complications (chronic kidney disease). Her daughter accompanies her today.  Latest HbA1C: Lab Results  Component Value Date   HGBA1C 9.1* 06/16/2012  Previous values: 9.3%, 8.9%, 9.7%.  She takes: - 50 units Lantus qhs - Novolog 5 with dinner + SSI target 150, ISF 30 - Januvia 100 mg We stopped Invokana 100 mg at last visit due to CKD,. Was prev. on Glipizide She was on Metformin and that helped tremendously - but had to be taken off Metformin b/c CKD. She took Welchol in the past, now off.  She checks sugars, brings an exemplary log that she put together along with her daughter. Most sugars at goal, but some in the 180-200, clearly dependent on her meals as she also documents her food diary. - am: 160 >> 130-165 >> 95-140, but in last 2 weeks had 4 out of 22 sugars 170-266. She checks at night, too and documents in am - before lunch: now 119-132 - 2h after lunch: now 147 and 256 - before dinner: now 99-102 - 2h after dinner: now 118-211 - hs: 240 >> 200-235 >> 118-227 No lows, has hypoglycemia awareness.  She tells me she can skip meals until 3-4 pm. Lowest sugar 95   No neuropathy. Last eye exam 2 mo ago - [name]. 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  She has a Physiological scientist (she was seeing her 3x a week in the past) and a nutritionist at [employer] ([name] - [facility]); also has a Biomedical scientist. She also has GERD for which she takes Protonix. She has gout. Has a h/o stroke in 2004 (no residual deficits). She has been told she has hypothyroidism in the past and was told to increase the iodine intake. She had 2 low TSH levels after that, but after she decreased back her iodine intake iodine, TSH normalized. Last  TSH 0.72 in 05/03/2012. She is not on thyroid medication. She has a FH of Graves ds in 2 aunts and a cousin.   She c/o mm cramps all over body. These continue even off Crestor. She had a nl K at last BMP check.   I reviewed pt's medications, allergies, PMH, social hx, family hx and no changes required. She started Lyrica and Lasix before last visit. She tapered off Venlafaxine and she is experiencing hot flushes now again. She stopped Crestor and there is muscle cramps, she also stopped colchicine.  Review of Systems Constitutional: + weight gain, + fatigue, + hot flushes/chills, poor sleep,  Eyes: no blurry vision, no xerophthalmia ENT: no sore throat, no nodules palpated in throat, no dysphagia/ no odynophagia, + hoarseness Cardiovascular: still has CP/+ SOB/palpitations/leg swelling Respiratory: no cough/+ SOB/+ wheezing Gastrointestinal: + nausea/no V/+ D/ + GERD Musculoskeletal: + muscle/+ joint aches Skin: + rash, + itching Neurological: no tremors/numbness/tingling/dizziness Psychiatric: no depression/anxiety  Objective:   Physical Exam BP 124/66  Pulse 60  Temp(Src) 97.4 F (36.3 C) (Oral)  Wt 215 lb (97.523 kg)  BMI 36.89 kg/m2  SpO2 98% Wt Readings from Last 3 Encounters:  06/23/12 215 lb (97.523 kg)  06/16/12 218 lb 8 oz (99.111 kg)  06/14/12 219 lb (99.338 kg)   Constitutional: overweight, in NAD Eyes: PERRLA, EOMI, no exophthalmos ENT: moist mucous membranes, no  thyromegaly, no cervical lymphadenopathy Cardiovascular: RRR, No MRG Respiratory: CTA B Gastrointestinal: abdomen soft, NT, ND, BS+ Musculoskeletal: no deformities, strength intact in all 4 Skin: moist, warm, no rashes Neurological: no tremor with outstretched hands, DTR normal in all 4  Assessment:     1. DM2, poorly controlled, insulin-dependent, with complications - CKD stage 3    Plan:  Pt with long-standing insulin-dependent DM2, with the latest hemoglobin A1c at 9.3%, worsening, likely due to  lack of exercise and relaxing her diet. She checks her sugars and brings a good log, which we reviewed together. The sugars are improved compared to last visit, despite increased stress with her husband being in the hospital recently and also with a death in the family. - she still maintains a lower fasting blood sugar and staircase effect, with higher blood sugars before bedtime - we discussed about diet and I recommended that she make some more changes to avoid her sugar spikes. The pattern of the spikes is variable, therefore I would not change the insulin doses for now, but rather have her work on her diet - Will continue Januvia for now - continue Lantus at the same dose of 50 units nightly - continue NovoLog insulin 5 units  before dinner + SSI. She knows how to calculate the dose correctly. - I will see her back in 2 months to evaluate her sugar control  - I also encouraged her to include 3 meals a day, not to skip meals - For muscle cramps she can use Magnesium 250 mg daily and also coenzyme Q 100 mg twice a day, maybe using this, we'll allow to her to get back on Crestor

## 2012-06-23 NOTE — Patient Instructions (Signed)
Please continue the current insulin regimen. Continue Januvia.  Try to work on your diet to decrease the carbs and the fat. Please return in 2 months with your sugar log.

## 2012-06-29 ENCOUNTER — Ambulatory Visit (INDEPENDENT_AMBULATORY_CARE_PROVIDER_SITE_OTHER): Payer: Medicare Other | Admitting: Cardiovascular Disease

## 2012-06-29 ENCOUNTER — Encounter: Payer: Self-pay | Admitting: Cardiovascular Disease

## 2012-06-29 VITALS — BP 119/51 | HR 66 | Ht 63.0 in | Wt 219.8 lb

## 2012-06-29 DIAGNOSIS — R079 Chest pain, unspecified: Secondary | ICD-10-CM

## 2012-06-29 DIAGNOSIS — I2510 Atherosclerotic heart disease of native coronary artery without angina pectoris: Secondary | ICD-10-CM

## 2012-06-29 NOTE — Progress Notes (Signed)
History of Present Illness: 72 yo female with history of CVA, DM, HTN, HLD who is here today for cardiac follow up. I saw her as a new patient 04/10/12 for evaluation of chest pain.  EKG normal on 03/17/12. CT chest January 2014 for stridor and found incidentally to have calcification of the left main and all three major epicardial vessels. She had an evaluation by a cardiologist in [place] in June 2012 and had a normal echo per her report and a normal stress test.  She described left sided chest pain that began on 03/17/12. This was left sided pain that radiated to her upper back and neck. I arranged a stress test and echo. I elected not to proceed with a cardiac cath given her CKD. Stress myoview 04/17/12 with apical thinning, no evidence of ischemia, LVEF 81%. Echo overall normal. She has developed severe cramping in arms and legs. CK was over 2000. Crestor was stopped and her cramps are better.  She continues to have left shoulder and arm pain, worsened with movement. No chest pain or SOB.   Primary Care Physician: Scarlette Calico  Last Lipid Profile:Lipid Panel     Component Value Date/Time   CHOL 151 05/03/2012 1607   TRIG 196.0* 05/03/2012 1607   TRIG 142 11/25/2010 1359   HDL 45.40 05/03/2012 1607   CHOLHDL 3 05/03/2012 1607   VLDL 39.2 05/03/2012 1607   LDLCALC 66 05/03/2012 1607     Past Medical History  Diagnosis Date  . History of CVA (cerebrovascular accident) 2004  . Loss of smell 2004    due to CVA  . Gastritis   . Umbilical hernia   . Esophageal stricture   . History of chickenpox   . Allergic rhinitis   . Hypertension   . Gout   . GERD (gastroesophageal reflux disease)   . Diverticulosis of colon   . Diabetes mellitus type II   . Colon polyp, hyperplastic   . Asthma   . Renal insufficiency   . Hyperlipidemia     Past Surgical History  Procedure Laterality Date  . Appendectomy    . Vesicovaginal fistula closure w/ tah    . Hemorroidectomy    . Cesarean section      x 2  . Abdominal hysterectomy      Current Outpatient Prescriptions  Medication Sig Dispense Refill  . albuterol (PROVENTIL HFA;VENTOLIN HFA) 108 (90 BASE) MCG/ACT inhaler Inhale 2 puffs into the lungs every 4 (four) hours as needed.      Marland Kitchen amLODipine (NORVASC) 10 MG tablet Take 1 tablet by mouth daily.      Marland Kitchen BENICAR 40 MG tablet Take 40 mg by mouth daily.       . clopidogrel (PLAVIX) 75 MG tablet       . Fluticasone-Salmeterol (ADVAIR DISKUS) 100-50 MCG/DOSE AEPB Inhale 1 puff into the lungs 2 (two) times daily.  60 each  11  . furosemide (LASIX) 40 MG tablet Take 1 tablet by mouth daily.      Marland Kitchen glucose blood (ONE TOUCH ULTRA TEST) test strip USE tID  300 each  3  . insulin aspart (NOVOLOG FLEXPEN) 100 unit/mL SOLN FlexPen Inject under skin up to 10 units daily  15 mL  2  . insulin glargine (LANTUS SOLOSTAR) 100 UNIT/ML injection Inject 25 Units into the skin 2 (two) times daily.  10 mL  11  . Insulin Pen Needle (PEN NEEDLES 31GX5/16") 31G X 8 MM MISC Use bid  200 each  3  . ipratropium (ATROVENT) 0.03 % nasal spray 1-2 puffs each nostril up to every 8 hours if needed  30 mL  12  . labetalol (NORMODYNE) 200 MG tablet Take 1 tablet by mouth 2 (two) times daily.      Marland Kitchen MOVIPREP 100 G SOLR Take 1 kit (100 g total) by mouth once. "Pharmacist please use BIN: [id number] GROUP: [id number] ID: [id number] Call [phone] for pharmacy questions "Pt will save $10"  1 kit  0  . pantoprazole (PROTONIX) 40 MG tablet Take 1 tablet by mouth daily.      . pregabalin (LYRICA) 50 MG capsule Take 50 mg by mouth as needed.       . sitaGLIPtin (JANUVIA) 100 MG tablet Take 100 mg by mouth daily.        Marland Kitchen zolpidem (AMBIEN) 5 MG tablet Take 1 tablet (5 mg total) by mouth at bedtime as needed.  30 tablet  5   No current facility-administered medications for this visit.    Allergies  Allergen Reactions  . Crestor (Rosuvastatin)     myopathy  . Ivp Dye (Iodinated Diagnostic Agents) Other (See Comments)     Nausea and SOB per patient  . Pioglitazone     REACTION: weight gain  . Sulfonamide Derivatives     REACTION: bLISTERS    History   Social History  . Marital Status: Married    Spouse Name: N/A    Number of Children: 2  . Years of Education: N/A   Occupational History  . Retired Pharmacist, hospital   .     Social History Main Topics  . Smoking status: Former Smoker -- 1.00 packs/day for 30 years    Types: Cigarettes    Quit date: 01/18/2002  . Smokeless tobacco: Never Used  . Alcohol Use: No  . Drug Use: No  . Sexually Active: No   Other Topics Concern  . Not on file   Social History Narrative  . No narrative on file    Family History  Problem Relation Age of Onset  . GER disease Father   . Heart failure Father     CHF  . Ovarian cancer Mother   . Colon cancer    . Colon polyps Father   . Diabetes    . CAD Neg Hx     Review of Systems:  As stated in the HPI and otherwise negative.   BP 119/51  Pulse 66  Ht 5\' 3"  (1.6 m)  Wt 219 lb 12.8 oz (99.701 kg)  BMI 38.95 kg/m2  Physical Examination: General: Well developed, well nourished, NAD HEENT: OP clear, mucus membranes moist SKIN: warm, dry. No rashes. Neuro: No focal deficits Musculoskeletal: Muscle strength 5/5 all ext Psychiatric: Mood and affect normal Neck: No JVD, no carotid bruits, no thyromegaly, no lymphadenopathy. Lungs:Clear bilaterally, no wheezes, rhonci, crackles Cardiovascular: Regular rate and rhythm. No murmurs, gallops or rubs. Abdomen:Soft. Bowel sounds present. Non-tender.  Extremities: No lower extremity edema. Pulses are 2 + in the bilateral DP/PT.   Stress myoview 04/17/12: Stress Procedure: The patient received IV Lexiscan 0.4 mg over 15-seconds. Technetium 3m Sestamibi injected at 30-seconds. This patient was sob, had chest discomfort, nausea, and a headache with the Lexiscan injection. Quantitative spect images were obtained after a 45 minute delay.  Stress ECG: No significant ST  segment change suggestive of ischemia.  QPS  Raw Data Images: Acquisition technically good; normal left ventricular size.  Stress Images: There is decreased uptake in the  apex.  Rest Images: There is decreased uptake in the apex.  Subtraction (SDS): No evidence of ischemia.  Transient Ischemic Dilatation (Normal <1.22): 0.79  Lung/Heart Ratio (Normal <0.45): 0.31  Quantitative Gated Spect Images  QGS EDV: 57 ml  QGS ESV: 10 ml  Impression  Exercise Capacity: Lexiscan with no exercise.  BP Response: Normal blood pressure response.  Clinical Symptoms: There is chest pain and dyspnea.  ECG Impression: No significant ST segment change suggestive of ischemia.  Comparison with Prior Nuclear Study: No images to compare  Overall Impression: Normal stress nuclear study with a small, mild, fixed apical defect consistent with thinning; no ischemia.  LV Ejection Fraction: 82%. LV Wall Motion: NL LV Function; NL Wall Motion   Echo 04/13/12: Left ventricle: The cavity size was normal. Wall thickness was increased in a pattern of mild LVH. Systolic function was vigorous. The estimated ejection fraction was in the range of 65% to 70%. Wall motion was normal; there were no regional wall motion abnormalities. Doppler parameters are consistent with abnormal left ventricular relaxation (grade 1 diastolic dysfunction).   Assessment and Plan:   1. CAD: Coronary calcification noted on non-cardiac dedicated CT chest. Atypical Chest pain.  Her chest pain does not appear to be cardiac. Her stress test is normal. Echo is normal. Left shoulder pain is likely musculoskeletal. No further cardiac workup. I will see her back in one year.

## 2012-06-29 NOTE — Patient Instructions (Addendum)
Your physician wants you to follow-up in:  12 months.  You will receive a reminder letter in the mail two months in advance. If you don't receive a letter, please call our office to schedule the follow-up appointment.   

## 2012-07-03 ENCOUNTER — Other Ambulatory Visit (INDEPENDENT_AMBULATORY_CARE_PROVIDER_SITE_OTHER): Payer: Medicare Other

## 2012-07-03 ENCOUNTER — Telehealth: Payer: Self-pay | Admitting: Internal Medicine

## 2012-07-03 ENCOUNTER — Encounter: Payer: Self-pay | Admitting: Internal Medicine

## 2012-07-03 DIAGNOSIS — Z86010 Personal history of colonic polyps: Secondary | ICD-10-CM | POA: Diagnosis not present

## 2012-07-03 DIAGNOSIS — R198 Other specified symptoms and signs involving the digestive system and abdomen: Secondary | ICD-10-CM

## 2012-07-03 DIAGNOSIS — O26899 Other specified pregnancy related conditions, unspecified trimester: Secondary | ICD-10-CM

## 2012-07-03 DIAGNOSIS — R109 Unspecified abdominal pain: Secondary | ICD-10-CM | POA: Diagnosis not present

## 2012-07-03 LAB — BASIC METABOLIC PANEL
CO2: 24 mEq/L (ref 19–32)
GFR: 21.54 mL/min — ABNORMAL LOW (ref >60.00)
Glucose, Bld: 109 mg/dL — ABNORMAL HIGH (ref 70–99)
Potassium: 4.9 mEq/L (ref 3.5–5.1)
Sodium: 139 mEq/L (ref 135–145)

## 2012-07-03 NOTE — Telephone Encounter (Signed)
Labs in EPIC. Spoke with patient's husband and he will have patient come for labs.

## 2012-07-03 NOTE — Telephone Encounter (Signed)
Patient scheduled for colonoscopy on 07/05/12. She is calling to report an episode on Saturday with several hours of diarrhea. She also wants to let Nicoletta Ba, PA know the cramping has returned also. States she saw the kidney MD and Dr. Ronnald Ramp for the cramping. Dr. Ronnald Ramp took her off Crestor and Colcry. She has been better until the episode with diarrhea. Now the abdominal and "body" cramping has returned. She wants to be sure it is okay to do the prep for the colonoscopy when she is cramping. Please, advise.

## 2012-07-03 NOTE — Telephone Encounter (Signed)
Would like her to come for a BMET today, and CK- then  as long as that is ok she can take prep -please call her and see if she can come today for labs

## 2012-07-05 ENCOUNTER — Ambulatory Visit (AMBULATORY_SURGERY_CENTER): Payer: Medicare Other | Admitting: Internal Medicine

## 2012-07-05 ENCOUNTER — Encounter: Payer: Self-pay | Admitting: Internal Medicine

## 2012-07-05 VITALS — BP 136/80 | HR 68 | Temp 97.4°F | Resp 16 | Ht 64.0 in | Wt 219.0 lb

## 2012-07-05 DIAGNOSIS — R198 Other specified symptoms and signs involving the digestive system and abdomen: Secondary | ICD-10-CM

## 2012-07-05 DIAGNOSIS — K219 Gastro-esophageal reflux disease without esophagitis: Secondary | ICD-10-CM | POA: Diagnosis not present

## 2012-07-05 DIAGNOSIS — Z800 Family history of malignant neoplasm of digestive organs: Secondary | ICD-10-CM

## 2012-07-05 DIAGNOSIS — M109 Gout, unspecified: Secondary | ICD-10-CM | POA: Diagnosis not present

## 2012-07-05 DIAGNOSIS — K222 Esophageal obstruction: Secondary | ICD-10-CM | POA: Diagnosis not present

## 2012-07-05 DIAGNOSIS — Z86010 Personal history of colonic polyps: Secondary | ICD-10-CM | POA: Diagnosis not present

## 2012-07-05 DIAGNOSIS — R1084 Generalized abdominal pain: Secondary | ICD-10-CM | POA: Diagnosis not present

## 2012-07-05 DIAGNOSIS — D126 Benign neoplasm of colon, unspecified: Secondary | ICD-10-CM

## 2012-07-05 DIAGNOSIS — K700 Alcoholic fatty liver: Secondary | ICD-10-CM | POA: Diagnosis not present

## 2012-07-05 LAB — GLUCOSE, CAPILLARY: Glucose-Capillary: 102 mg/dL — ABNORMAL HIGH (ref 70–99)

## 2012-07-05 LAB — HM COLONOSCOPY

## 2012-07-05 MED ORDER — SODIUM CHLORIDE 0.9 % IV SOLN: 500.0000 mL | INTRAVENOUS | Status: DC

## 2012-07-05 NOTE — Progress Notes (Signed)
Called to room to assist during endoscopic procedure.  Patient ID and intended procedure confirmed with present staff. Received instructions for my participation in the procedure from the performing physician.  

## 2012-07-05 NOTE — Progress Notes (Signed)
Pt stable to RR 

## 2012-07-05 NOTE — Progress Notes (Signed)
Patient did not experience any of the following events: a burn prior to discharge; a fall within the facility; wrong site/side/patient/procedure/implant event; or a hospital transfer or hospital admission upon discharge from the facility. (G8907) Patient did not have preoperative order for IV antibiotic SSI prophylaxis. (G8918)  

## 2012-07-05 NOTE — Op Note (Signed)
[facility]  [address] [place], [id number]   COLONOSCOPY PROCEDURE REPORT  PATIENT: Megan [name], Megan [name]  MR#: [id number] BIRTHDATE: September 14, 1940 , 72  yrs. old GENDER: Female ENDOSCOPIST: Lafayette Dragon, MD REFERRED BY:  Janith Lima, M.D. PROCEDURE DATE:  07/05/2012 PROCEDURE:    Colonoscopy with snare polypectomy, colonoscopy with cold biopsy polypectomy ASA CLASS:   Class III INDICATIONS:Patient's immediate family history of colon cancer. personal hx of adenomatous polyp 2005, hypeplastic polyp in 2010, Parent with colon cancer, pt on Plavix MEDICATIONS: MAC sedation, administered by CRNA and propofol (Diprivan) 300mg  IV  DESCRIPTION OF PROCEDURE:   After the risks and benefits and of the procedure were explained, informed consent was obtained.  A digital rectal exam revealed no abnormalities of the rectum.    The LB [id number]  endoscope was introduced through the anus and advanced to the cecum, which was identified by both the appendix and ileocecal valve .  The quality of the prep was good, using MoviPrep .  The instrument was then slowly withdrawn as the colon was fully examined.     COLON FINDINGS: Five polypoid shaped sessile polyps ranging between 5-54mm in size were found. 3 polyps at 60 cm, 79mm each, 2 polyps at 45 cm removed with cold snare.  A polypectomy was performed with a cold snare.and cold biopsies  The resection was complete and the polyp tissue was completely retrieved. There was moderately severe diverticulosis of the sigmoid colon    Retroflexed views revealed no abnormalities.  There were small internal hemorrhoids   The scope was then withdrawn from the patient and the procedure completed.  COMPLICATIONS: There were no complications. ENDOSCOPIC IMPRESSION: Five sessile polyps ranging between 5-22mm in size were found; polypectomy was performed with a cold snare moderately severe sigmoid diverticulosis  First grade internal  hemorrhoids  RECOMMENDATIONS: 1.  Await pathology results 2.  High fiber diet 3. hold Plavix for 2 weeks   REPEAT EXAM: for Colonoscopy. pending path reports  cc:  _______________________________ eSigned:  Lafayette Dragon, MD 07/05/2012 4:51 PM     PATIENT NAME:  Megan [name], Megan [name] MR#: [id number]

## 2012-07-05 NOTE — Patient Instructions (Addendum)
HOLD PLAVIX FOR 2 WEEKS, UNTIL July 3,2014.    YOU HAD AN ENDOSCOPIC PROCEDURE TODAY AT [facility]: Refer to the procedure report that was given to you for any specific questions about what was found during the examination.  If the procedure report does not answer your questions, please call your gastroenterologist to clarify.  If you requested that your care partner not be given the details of your procedure findings, then the procedure report has been included in a sealed envelope for you to review at your convenience later.  YOU SHOULD EXPECT: Some feelings of bloating in the abdomen. Passage of more gas than usual.  Walking can help get rid of the air that was put into your GI tract during the procedure and reduce the bloating. If you had a lower endoscopy (such as a colonoscopy or flexible sigmoidoscopy) you may notice spotting of blood in your stool or on the toilet paper. If you underwent a bowel prep for your procedure, then you may not have a normal bowel movement for a few days.  DIET: Your first meal following the procedure should be a light meal and then it is ok to progress to your normal diet.  A half-sandwich or bowl of soup is an example of a good first meal.  Heavy or fried foods are harder to digest and may make you feel nauseous or bloated.  Likewise meals heavy in dairy and vegetables can cause extra gas to form and this can also increase the bloating.  Drink plenty of fluids but you should avoid alcoholic beverages for 24 hours.  ACTIVITY: Your care partner should take you home directly after the procedure.  You should plan to take it easy, moving slowly for the rest of the day.  You can resume normal activity the day after the procedure however you should NOT DRIVE or use heavy machinery for 24 hours (because of the sedation medicines used during the test).    SYMPTOMS TO REPORT IMMEDIATELY: A gastroenterologist can be reached at any hour.  During normal business  hours, 8:30 AM to 5:00 PM Monday through Friday, call [phone].  After hours and on weekends, please call the GI answering service at [phone] who will take a message and have the physician on call contact you.   Following lower endoscopy (colonoscopy or flexible sigmoidoscopy):  Excessive amounts of blood in the stool  Significant tenderness or worsening of abdominal pains  Swelling of the abdomen that is new, acute  Fever of 100F or higher  FOLLOW UP: If any biopsies were taken you will be contacted by phone or by letter within the next 1-3 weeks.  Call your gastroenterologist if you have not heard about the biopsies in 3 weeks.  Our staff will call the home number listed on your records the next business day following your procedure to check on you and address any questions or concerns that you may have at that time regarding the information given to you following your procedure. This is a courtesy call and so if there is no answer at the home number and we have not heard from you through the emergency physician on call, we will assume that you have returned to your regular daily activities without incident.  SIGNATURES/CONFIDENTIALITY: You and/or your care partner have signed paperwork which will be entered into your electronic medical record.  These signatures attest to the fact that that the information above on your After Visit Summary has been reviewed  and is understood.  Full responsibility of the confidentiality of this discharge information lies with you and/or your care-partner. 

## 2012-07-06 ENCOUNTER — Telehealth: Payer: Self-pay

## 2012-07-06 NOTE — Telephone Encounter (Signed)
Left message on answering machine. 

## 2012-07-11 ENCOUNTER — Encounter: Payer: Self-pay | Admitting: Internal Medicine

## 2012-07-12 ENCOUNTER — Ambulatory Visit (INDEPENDENT_AMBULATORY_CARE_PROVIDER_SITE_OTHER)
Admission: RE | Admit: 2012-07-12 | Discharge: 2012-07-12 | Disposition: A | Discharge status: Home / Self Care | Payer: Medicare Other | Source: Ambulatory Visit | Attending: Internal Medicine | Admitting: Internal Medicine

## 2012-07-12 ENCOUNTER — Encounter: Payer: Self-pay | Admitting: Internal Medicine

## 2012-07-12 ENCOUNTER — Ambulatory Visit: Payer: Medicare Other | Admitting: Internal Medicine

## 2012-07-12 ENCOUNTER — Ambulatory Visit (INDEPENDENT_AMBULATORY_CARE_PROVIDER_SITE_OTHER): Payer: Medicare Other | Admitting: Internal Medicine

## 2012-07-12 VITALS — BP 112/62 | HR 72 | Temp 97.0°F | Resp 12 | Wt 222.0 lb

## 2012-07-12 DIAGNOSIS — M25569 Pain in unspecified knee: Secondary | ICD-10-CM | POA: Diagnosis not present

## 2012-07-12 DIAGNOSIS — M25512 Pain in left shoulder: Secondary | ICD-10-CM

## 2012-07-12 DIAGNOSIS — M25519 Pain in unspecified shoulder: Secondary | ICD-10-CM | POA: Insufficient documentation

## 2012-07-12 DIAGNOSIS — M25561 Pain in right knee: Secondary | ICD-10-CM

## 2012-07-12 MED ORDER — HYDROCODONE-ACETAMINOPHEN 5-325 MG PO TABS: 1.0000 | ORAL_TABLET | Freq: Four times a day (QID) | ORAL | Status: DC | PRN

## 2012-07-12 NOTE — Progress Notes (Signed)
Subjective:    Patient ID: ABBEE [name], female    DOB: 10-Dec-1940, 72 y.o.   MRN: [id number]  Arthritis Presents for follow-up visit. She complains of pain. She reports no stiffness, joint swelling or joint warmth. The symptoms have been worsening. Affected locations include the left shoulder and right knee. Her pain is at a severity of 4/10. Associated symptoms include pain at night and pain while resting. Pertinent negatives include no diarrhea, dry eyes, dry mouth, dysuria, fatigue, fever, rash, Raynaud's syndrome, uveitis or weight loss. Compliance with total regimen is 51-75%.      Review of Systems  Constitutional: Negative.  Negative for fever, weight loss and fatigue.  HENT: Negative.   Eyes: Negative.   Respiratory: Negative.  Negative for cough, chest tightness, shortness of breath and stridor.   Cardiovascular: Negative.  Negative for chest pain, palpitations and leg swelling.  Gastrointestinal: Negative.  Negative for abdominal pain and diarrhea.  Endocrine: Negative.   Genitourinary: Negative.  Negative for dysuria.  Musculoskeletal: Positive for arthralgias and arthritis. Negative for myalgias, back pain, joint swelling, gait problem and stiffness.  Skin: Negative for rash.  Allergic/Immunologic: Negative.   Neurological: Negative.   Hematological: Negative.   Psychiatric/Behavioral: Negative.        Objective:   Physical Exam  Vitals reviewed. Constitutional: She is oriented to person, place, and time. She appears well-developed and well-nourished. No distress.  HENT:  Head: Normocephalic and atraumatic.  Mouth/Throat: No oropharyngeal exudate.  Eyes: Conjunctivae are normal. Right eye exhibits no discharge. Left eye exhibits no discharge. No scleral icterus.  Neck: Normal range of motion. Neck supple. No JVD present. No tracheal deviation present. No thyromegaly present.  Cardiovascular: Normal rate, regular rhythm, normal heart sounds and intact distal pulses.   Exam reveals no gallop and no friction rub.   No murmur heard. Pulmonary/Chest: Effort normal and breath sounds normal. No stridor. No respiratory distress. She has no wheezes. She has no rales. She exhibits no tenderness.  Abdominal: Soft. Bowel sounds are normal. She exhibits no distension and no mass. There is no tenderness. There is no rebound and no guarding.  Musculoskeletal: She exhibits no edema and no tenderness.       Left shoulder: She exhibits decreased range of motion. She exhibits no tenderness, no bony tenderness, no swelling, no effusion, no crepitus, no laceration, no pain, no spasm, normal pulse and normal strength.       Right knee: She exhibits normal range of motion, no swelling, no effusion, no ecchymosis, no deformity, no laceration, no erythema, normal alignment, no LCL laxity, normal patellar mobility and no bony tenderness. Tenderness found. Medial joint line tenderness noted. No lateral joint line, no MCL, no LCL and no patellar tendon tenderness noted.  Lymphadenopathy:    She has no cervical adenopathy.  Neurological: She is oriented to person, place, and time.  Skin: Skin is warm and dry. No rash noted. She is not diaphoretic. No erythema. No pallor.  Psychiatric: She has a normal mood and affect. Her behavior is normal. Judgment and thought content normal.     Lab Results  Component Value Date   WBC 7.9 05/03/2012   HGB 12.2 05/03/2012   HCT 36.5 05/03/2012   PLT 191.0 05/03/2012   GLUCOSE 109* 07/03/2012   CHOL 151 05/03/2012   TRIG 196.0* 05/03/2012   HDL 45.40 05/03/2012   LDLDIRECT 91.4 05/13/2011   LDLCALC 66 05/03/2012   ALT 48* 06/16/2012   AST 57* 06/16/2012  NA 139 07/03/2012   K 4.9 07/03/2012   CL 106 07/03/2012   CREATININE 2.8* 07/03/2012   BUN 53* 07/03/2012   CO2 24 07/03/2012   TSH 0.72 05/03/2012   HGBA1C 9.1* 06/16/2012   MICROALBUR 11.8* 02/08/2012       Assessment & Plan:

## 2012-07-12 NOTE — Patient Instructions (Signed)
Degenerative Arthritis You have osteoarthritis. This is the wear and tear arthritis that comes with aging. It is also called degenerative arthritis. This is common in people past middle age. It is caused by stress on the joints. The large weight bearing joints of the lower extremities are most often affected. The knees, hips, back, neck, and hands can become painful, swollen, and stiff. This is the most common type of arthritis. It comes on with age, carrying too much weight, or from an injury. Treatment includes resting the sore joint until the pain and swelling improve. Crutches or a walker may be needed for severe flares. Only take over-the-counter or prescription medicines for pain, discomfort, or fever as directed by your caregiver. Local heat therapy may improve motion. Cortisone shots into the joint are sometimes used to reduce pain and swelling during flares. Osteoarthritis is usually not crippling and progresses slowly. There are things you can do to decrease pain:  Avoid high impact activities.  Exercise regularly.  Low impact exercises such as walking, biking and swimming help to keep the muscles strong and keep normal joint function.  Stretching helps to keep your range of motion.  Lose weight if you are overweight. This reduces joint stress. In severe cases when you have pain at rest or increasing disability, joint surgery may be helpful. See your caregiver for follow-up treatment as recommended.  SEEK IMMEDIATE MEDICAL CARE IF:   You have severe joint pain.  Marked swelling and redness in your joint develops.  You develop a high fever. Document Released: 01/04/2005 Document Revised: 03/29/2011 Document Reviewed: 06/06/2006 [employer] Patient Information 2014 [employer].  

## 2012-07-13 ENCOUNTER — Telehealth: Payer: Self-pay | Admitting: Internal Medicine

## 2012-07-13 NOTE — Assessment & Plan Note (Signed)
The xray is normal, she will try norco for the pain I have asked her to see ortho to evaluate her rotator cuff

## 2012-07-13 NOTE — Assessment & Plan Note (Signed)
The xray shows ? Small effusion, I have asked her to start norco for the pain and to see ortho (I question the need for an MRI to see if there is a medial meniscus issue)

## 2012-07-13 NOTE — Telephone Encounter (Signed)
I called to inform pt of her referral appt ,   pt states now her legs, feet, back are swollen she doesn't know what to do. she would like a call back regarding this  Problem

## 2012-07-14 DIAGNOSIS — IMO0002 Reserved for concepts with insufficient information to code with codable children: Secondary | ICD-10-CM | POA: Diagnosis not present

## 2012-07-14 DIAGNOSIS — I10 Essential (primary) hypertension: Secondary | ICD-10-CM | POA: Diagnosis not present

## 2012-07-14 DIAGNOSIS — N183 Chronic kidney disease, stage 3 unspecified: Secondary | ICD-10-CM | POA: Diagnosis not present

## 2012-07-14 DIAGNOSIS — S43429A Sprain of unspecified rotator cuff capsule, initial encounter: Secondary | ICD-10-CM | POA: Diagnosis not present

## 2012-07-20 ENCOUNTER — Other Ambulatory Visit: Payer: Self-pay | Admitting: Internal Medicine

## 2012-07-20 DIAGNOSIS — M19019 Primary osteoarthritis, unspecified shoulder: Secondary | ICD-10-CM | POA: Diagnosis not present

## 2012-07-25 DIAGNOSIS — S43429A Sprain of unspecified rotator cuff capsule, initial encounter: Secondary | ICD-10-CM | POA: Diagnosis not present

## 2012-07-27 DIAGNOSIS — M25819 Other specified joint disorders, unspecified shoulder: Secondary | ICD-10-CM | POA: Diagnosis not present

## 2012-07-27 DIAGNOSIS — S4350XA Sprain of unspecified acromioclavicular joint, initial encounter: Secondary | ICD-10-CM | POA: Diagnosis not present

## 2012-07-27 DIAGNOSIS — M25519 Pain in unspecified shoulder: Secondary | ICD-10-CM | POA: Diagnosis not present

## 2012-07-28 ENCOUNTER — Other Ambulatory Visit (INDEPENDENT_AMBULATORY_CARE_PROVIDER_SITE_OTHER): Payer: Medicare Other

## 2012-07-28 ENCOUNTER — Encounter: Payer: Self-pay | Admitting: Internal Medicine

## 2012-07-28 ENCOUNTER — Telehealth: Payer: Self-pay | Admitting: Internal Medicine

## 2012-07-28 ENCOUNTER — Other Ambulatory Visit: Payer: Self-pay | Admitting: Internal Medicine

## 2012-07-28 ENCOUNTER — Ambulatory Visit (INDEPENDENT_AMBULATORY_CARE_PROVIDER_SITE_OTHER): Payer: Medicare Other | Admitting: Internal Medicine

## 2012-07-28 VITALS — BP 112/64 | HR 67 | Temp 97.5°F | Resp 16 | Ht 63.5 in | Wt 216.0 lb

## 2012-07-28 DIAGNOSIS — E1165 Type 2 diabetes mellitus with hyperglycemia: Secondary | ICD-10-CM | POA: Diagnosis not present

## 2012-07-28 DIAGNOSIS — R109 Unspecified abdominal pain: Secondary | ICD-10-CM | POA: Diagnosis not present

## 2012-07-28 DIAGNOSIS — E1129 Type 2 diabetes mellitus with other diabetic kidney complication: Secondary | ICD-10-CM

## 2012-07-28 DIAGNOSIS — R197 Diarrhea, unspecified: Secondary | ICD-10-CM | POA: Diagnosis not present

## 2012-07-28 LAB — CBC WITH DIFFERENTIAL/PLATELET
Basophils Relative: 0.5 % (ref 0.0–3.0)
Eosinophils Relative: 1.3 % (ref 0.0–5.0)
Lymphocytes Relative: 27.1 % (ref 12.0–46.0)
MCV: 85.8 fl (ref 78.0–100.0)
Monocytes Absolute: 0.8 10*3/uL (ref 0.1–1.0)
Monocytes Relative: 9.6 % (ref 3.0–12.0)
Neutrophils Relative %: 61.5 % (ref 43.0–77.0)
Platelets: 207 10*3/uL (ref 150.0–400.0)
RBC: 3.91 Mil/uL (ref 3.87–5.11)
WBC: 8.1 10*3/uL (ref 4.5–10.5)

## 2012-07-28 LAB — COMPREHENSIVE METABOLIC PANEL
Albumin: 3.8 g/dL (ref 3.5–5.2)
Alkaline Phosphatase: 92 U/L (ref 39–117)
BUN: 48 mg/dL — ABNORMAL HIGH (ref 6–23)
CO2: 22 mEq/L (ref 19–32)
Calcium: 9.2 mg/dL (ref 8.4–10.5)
Chloride: 105 mEq/L (ref 96–112)
GFR: 22.37 mL/min — ABNORMAL LOW (ref >60.00)
Glucose, Bld: 134 mg/dL — ABNORMAL HIGH (ref 70–99)
Potassium: 4.6 mEq/L (ref 3.5–5.1)
Sodium: 135 mEq/L (ref 135–145)
Total Protein: 7.4 g/dL (ref 6.0–8.3)

## 2012-07-28 LAB — BASIC METABOLIC PANEL
BUN: 48 mg/dL — ABNORMAL HIGH (ref 6–23)
Chloride: 105 mEq/L (ref 96–112)
GFR: 22.37 mL/min — ABNORMAL LOW (ref >60.00)
Glucose, Bld: 134 mg/dL — ABNORMAL HIGH (ref 70–99)
Potassium: 4.6 mEq/L (ref 3.5–5.1)
Sodium: 135 mEq/L (ref 135–145)

## 2012-07-28 MED ORDER — INSULIN ASPART 100 UNIT/ML FLEXPEN: PEN_INJECTOR | SUBCUTANEOUS | Status: DC

## 2012-07-28 MED ORDER — INSULIN GLARGINE 100 UNIT/ML ~~LOC~~ SOLN: 20.0000 [IU] | Freq: Two times a day (BID) | SUBCUTANEOUS | Status: DC

## 2012-07-28 NOTE — Patient Instructions (Addendum)
Please decrease Lantus to 20 units twice a day. Use Novolog 5 units 3x a day with all main meals.  Can stop Januvia after you run out of your prescription.

## 2012-07-28 NOTE — Telephone Encounter (Signed)
Please start a Probiotic 1 po qd, also have CBC, b-met in our office. OK to take Peptobismol prn

## 2012-07-28 NOTE — Telephone Encounter (Signed)
Left a message for patient to call me. 

## 2012-07-28 NOTE — Telephone Encounter (Signed)
Patient had a colonoscopy with polyps removed on 07/05/12.Patient states for the last 3 days, she has been waking up during the night with cramping around the navel area, night sweats and diarrhea. States then she starts to freeze. States this goes on every hour during the night. Last night, she took Pepto bismol at supper and at bedtime. She did not have the diarrhea last night but continued to have night sweats alternating with cold feeling, gas and cramping. States she does not have a fever during these episodes. She did check her blood sugar with the first episode last night and it was 84. She ate a banana and drank OJ. Her blood sugar was normal one hour later but she still had the symptoms. Please, advise

## 2012-07-28 NOTE — Telephone Encounter (Signed)
Spoke with patient and gave her recommendations. She will come for labs today.

## 2012-07-28 NOTE — Progress Notes (Signed)
Subjective:     Patient ID: [place], female   DOB: 08/27/1940, 72 y.o.   MRN: [id number]  HPI Megan [name] is a pleasant 72 year old woman returning for f/u for DM2, dx 1995, insulin-dependent, uncontrolled, with complications (chronic kidney disease). She is here with her husband.  She has diarrhea and gas at night then comes back to bed >> feels very hot >> continues every hour. She had a colonoscopy >> 4 polyps removed. Last night at 4:30 she checked her sugars: 84 >> OJ + 1/2 banana >> this am 125. This scenario happened for 2 nights, last night diarrhea better but still hot flushes and gas. She called Megan [name] office and waiting for a call back.  Latest HbA1C: Lab Results  Component Value Date   HGBA1C 9.1* 06/16/2012  Previous values: 9.3% on 05/03/2012.  She takes: - 50 units Lantus qhs - Novolog 5 with dinner + SSI target 150, ISF 30 - Januvia 100 mg She was previously on Invokana 100 mg, but we need to stop it due to CKD. She was also previously on Glipizide. She was previously on Metformin and that helped tremendously - but had to be taken off Metformin b/c CKD.  She took Welchol in the past, now off.  She checks sugars, but did not bring a sugar log at this time. Per her recall, her sugars are: - am: 160 >> 130-165 >> 95-140, but in last 2 weeks had 4 out of 22 sugars 170-266 >> 100 -125 now - before lunch: 119-132 >> 150s - 2h after lunch: 147 and 256 >> not checking - before dinner: now 99-102 >> 125-150 (high around 150 if snacking before) - 2h after dinner: now 118-211 >> not checking - hs: 240 >> 200-235 >> 118-227 >> 180s as she grazes  No lows other than the 84, has hypoglycemia awareness.   No neuropathy. Last eye exam ~5 mo ago - Megan [name]. 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  I reviewed pt's medications, allergies, PMH, social hx, family hx and no changes required except as above.  Review of  Systems Constitutional: no weight gain/loss, + fatigue, + hot flushes/chills, poor sleep, nocturia Eyes: no blurry vision, no xerophthalmia ENT: no sore throat, no nodules palpated in throat, no dysphagia/ no odynophagia, + hoarseness Cardiovascular: still has CP/+ SOB/palpitations/+ leg swelling Respiratory: no cough/+ SOB Gastrointestinal: + nausea/no V/+ D/ + GERD Musculoskeletal: + muscle/+ joint aches Skin: + rash, + itching Neurological: no tremors/numbness/tingling/dizziness  Objective:   Physical Exam BP 112/64  Pulse 67  Temp(Src) 97.5 F (36.4 C) (Oral)  Resp 16  Ht 5' 3.5" (1.613 m)  Wt 216 lb (97.977 kg)  BMI 37.66 kg/m2  SpO2 95% Wt Readings from Last 3 Encounters:  07/28/12 216 lb (97.977 kg)  07/12/12 222 lb (100.699 kg)  07/05/12 219 lb (99.338 kg)   Constitutional: overweight, in NAD Eyes: PERRLA, EOMI, no exophthalmos ENT: moist mucous membranes, no thyromegaly, no cervical lymphadenopathy Cardiovascular: RRR, No MRG Respiratory: CTA B Gastrointestinal: abdomen soft, NT, ND, BS+ Musculoskeletal: no deformities, strength intact in all 4 Skin: moist, warm, no rashes Neurological: no tremor with outstretched hands, DTR normal in all 4  Assessment:     1. DM2, poorly controlled, insulin-dependent, with complications - CKD stage 3    Plan:  Pt with long-standing insulin-dependent DM2, with the latest hemoglobin A1c at 9.3%, worsening, likely due to lack of exercise and relaxing her diet. She checks her sugars  but did not bring a log. Per her recall, the sugars are improved in a.m. compared to last visit, but they are higher in p.m. Compared to last visit, in a staircase pattern - I suggested that she added NovoLog insulin with all of her meals: NovoLog insulin 5 units tid ac + SSI (maintained previous sliding scale). - Will continue Januvia for now, but can stop when running out of the prescription - decrease Lantus to 20 units twice a day - I will see her  back in 2 months to evaluate her sugar control

## 2012-07-31 ENCOUNTER — Telehealth: Payer: Self-pay | Admitting: Internal Medicine

## 2012-07-31 NOTE — Telephone Encounter (Signed)
Spoke with patient and told her Dr. Olevia Perches has not reviewed labs yet. Will call her with results after she reviews.

## 2012-08-02 DIAGNOSIS — M25819 Other specified joint disorders, unspecified shoulder: Secondary | ICD-10-CM | POA: Diagnosis not present

## 2012-08-02 DIAGNOSIS — S43429A Sprain of unspecified rotator cuff capsule, initial encounter: Secondary | ICD-10-CM | POA: Diagnosis not present

## 2012-08-02 DIAGNOSIS — M25519 Pain in unspecified shoulder: Secondary | ICD-10-CM | POA: Diagnosis not present

## 2012-08-04 DIAGNOSIS — M25519 Pain in unspecified shoulder: Secondary | ICD-10-CM | POA: Diagnosis not present

## 2012-08-04 DIAGNOSIS — S43429A Sprain of unspecified rotator cuff capsule, initial encounter: Secondary | ICD-10-CM | POA: Diagnosis not present

## 2012-08-04 DIAGNOSIS — M25819 Other specified joint disorders, unspecified shoulder: Secondary | ICD-10-CM | POA: Diagnosis not present

## 2012-08-07 ENCOUNTER — Telehealth: Payer: Self-pay | Admitting: Internal Medicine

## 2012-08-07 ENCOUNTER — Telehealth: Payer: Self-pay | Admitting: *Deleted

## 2012-08-07 DIAGNOSIS — R197 Diarrhea, unspecified: Secondary | ICD-10-CM

## 2012-08-07 MED ORDER — DICYCLOMINE HCL 10 MG PO CAPS: ORAL_CAPSULE | ORAL | Status: DC

## 2012-08-07 NOTE — Telephone Encounter (Signed)
Please call pt, labs from  7/11 are unchanged: mild kidney impairements, mild LFT elevation,if she is still having diarrhea, please make appointment for her to see me ( she saw Amy in 05/2012)_ and try GES to r/o gastroparesis ( diabetic), add Bentyl 10 mg po bid for diarrhea and urgency, #60, 1 refill

## 2012-08-07 NOTE — Telephone Encounter (Signed)
Spoke with patient and gave her results and recommendations. GES scheduled at [facility] Mood) on 08/11/12 at 1:00 PM. NPO 6 hours prior and no stomach medications 4-6 hours prior. Patient aware. Scheduled OV on 08/11/12 at 3:00 PM(Patient is going out of town) Rx sent to pharmacy.

## 2012-08-07 NOTE — Telephone Encounter (Signed)
Please call pt with stable kidney and liver impairment. Offer an OV with me. Start Bentyl 10 mg, #60, 1 po bid, 1 refill, and obtain GES to r/o gastroparesis ----- Message ----- From: Hulan Saas, RN Sent: 08/07/2012 11:13 AM To: Lafayette Dragon, MD  See previous phone note

## 2012-08-09 ENCOUNTER — Encounter: Payer: Self-pay | Admitting: *Deleted

## 2012-08-09 DIAGNOSIS — S43429A Sprain of unspecified rotator cuff capsule, initial encounter: Secondary | ICD-10-CM | POA: Diagnosis not present

## 2012-08-09 DIAGNOSIS — M25519 Pain in unspecified shoulder: Secondary | ICD-10-CM | POA: Diagnosis not present

## 2012-08-09 DIAGNOSIS — M25819 Other specified joint disorders, unspecified shoulder: Secondary | ICD-10-CM | POA: Diagnosis not present

## 2012-08-11 ENCOUNTER — Ambulatory Visit: Payer: Medicare Other | Admitting: Internal Medicine

## 2012-08-11 ENCOUNTER — Encounter (HOSPITAL_COMMUNITY)
Admission: RE | Admit: 2012-08-11 | Discharge: 2012-08-11 | Disposition: A | Discharge status: Home / Self Care | Payer: Medicare Other | Source: Ambulatory Visit | Attending: Internal Medicine | Admitting: Internal Medicine

## 2012-08-11 ENCOUNTER — Telehealth: Payer: Self-pay | Admitting: *Deleted

## 2012-08-11 DIAGNOSIS — K2990 Gastroduodenitis, unspecified, without bleeding: Secondary | ICD-10-CM | POA: Diagnosis not present

## 2012-08-11 DIAGNOSIS — K2970 Gastritis, unspecified, without bleeding: Secondary | ICD-10-CM | POA: Diagnosis not present

## 2012-08-11 DIAGNOSIS — R197 Diarrhea, unspecified: Secondary | ICD-10-CM | POA: Diagnosis not present

## 2012-08-11 MED ORDER — TECHNETIUM TC 99M SULFUR COLLOID
2.0000 | Freq: Once | INTRAVENOUS | Status: AC | PRN
  Administered 2012-08-11: 2 via INTRAVENOUS

## 2012-08-11 NOTE — Telephone Encounter (Signed)
Patient calling to cancel OV at 3:00 PM because she will not finish the GES before that time. She would like [place] to call her on her mobile with results. She is going out of town until end of August.

## 2012-08-15 ENCOUNTER — Other Ambulatory Visit: Payer: Self-pay | Admitting: *Deleted

## 2012-08-15 MED ORDER — INSULIN ASPART 100 UNIT/ML FLEXPEN: PEN_INJECTOR | SUBCUTANEOUS | Status: DC

## 2012-08-22 ENCOUNTER — Ambulatory Visit: Payer: Medicare Other | Admitting: Internal Medicine

## 2012-09-14 ENCOUNTER — Other Ambulatory Visit: Payer: Self-pay | Admitting: *Deleted

## 2012-09-14 MED ORDER — PANTOPRAZOLE SODIUM 40 MG PO TBEC: 40.0000 mg | DELAYED_RELEASE_TABLET | Freq: Every day | ORAL | Status: DC

## 2012-09-27 DIAGNOSIS — J45909 Unspecified asthma, uncomplicated: Secondary | ICD-10-CM | POA: Diagnosis not present

## 2012-09-27 DIAGNOSIS — I10 Essential (primary) hypertension: Secondary | ICD-10-CM | POA: Diagnosis not present

## 2012-09-27 DIAGNOSIS — Z1231 Encounter for screening mammogram for malignant neoplasm of breast: Secondary | ICD-10-CM | POA: Diagnosis not present

## 2012-09-27 DIAGNOSIS — E119 Type 2 diabetes mellitus without complications: Secondary | ICD-10-CM | POA: Diagnosis not present

## 2012-09-27 DIAGNOSIS — Z79899 Other long term (current) drug therapy: Secondary | ICD-10-CM | POA: Diagnosis not present

## 2012-09-27 LAB — HEMOGLOBIN A1C: Hgb A1c MFr Bld: 6.6 % — AB (ref 4.0–6.0)

## 2012-10-13 DIAGNOSIS — Z23 Encounter for immunization: Secondary | ICD-10-CM | POA: Diagnosis not present

## 2012-10-13 DIAGNOSIS — N184 Chronic kidney disease, stage 4 (severe): Secondary | ICD-10-CM | POA: Diagnosis not present

## 2012-10-13 DIAGNOSIS — N179 Acute kidney failure, unspecified: Secondary | ICD-10-CM | POA: Diagnosis not present

## 2012-10-13 DIAGNOSIS — E1129 Type 2 diabetes mellitus with other diabetic kidney complication: Secondary | ICD-10-CM | POA: Diagnosis not present

## 2012-10-13 DIAGNOSIS — I129 Hypertensive chronic kidney disease with stage 1 through stage 4 chronic kidney disease, or unspecified chronic kidney disease: Secondary | ICD-10-CM | POA: Diagnosis not present

## 2012-10-18 ENCOUNTER — Ambulatory Visit: Payer: Medicare Other | Admitting: Internal Medicine

## 2012-10-19 DIAGNOSIS — I6340 Cerebral infarction due to embolism of unspecified cerebral artery: Secondary | ICD-10-CM | POA: Diagnosis not present

## 2012-10-19 DIAGNOSIS — E782 Mixed hyperlipidemia: Secondary | ICD-10-CM | POA: Diagnosis not present

## 2012-10-19 DIAGNOSIS — E119 Type 2 diabetes mellitus without complications: Secondary | ICD-10-CM | POA: Diagnosis not present

## 2012-10-19 DIAGNOSIS — I10 Essential (primary) hypertension: Secondary | ICD-10-CM | POA: Diagnosis not present

## 2012-10-24 ENCOUNTER — Other Ambulatory Visit: Payer: Self-pay | Admitting: *Deleted

## 2012-10-24 MED ORDER — PREGABALIN 50 MG PO CAPS: 50.0000 mg | ORAL_CAPSULE | ORAL | Status: DC | PRN

## 2012-10-25 DIAGNOSIS — R9389 Abnormal findings on diagnostic imaging of other specified body structures: Secondary | ICD-10-CM | POA: Diagnosis not present

## 2012-10-25 DIAGNOSIS — E119 Type 2 diabetes mellitus without complications: Secondary | ICD-10-CM | POA: Diagnosis not present

## 2012-11-03 ENCOUNTER — Other Ambulatory Visit: Payer: Self-pay

## 2012-11-03 MED ORDER — PANTOPRAZOLE SODIUM 40 MG PO TBEC: 40.0000 mg | DELAYED_RELEASE_TABLET | Freq: Every day | ORAL | Status: DC

## 2012-11-07 ENCOUNTER — Ambulatory Visit: Payer: Medicare Other | Admitting: Internal Medicine

## 2012-11-07 ENCOUNTER — Other Ambulatory Visit: Payer: Self-pay

## 2012-11-07 MED ORDER — PANTOPRAZOLE SODIUM 40 MG PO TBEC: 40.0000 mg | DELAYED_RELEASE_TABLET | Freq: Every day | ORAL | Status: DC

## 2012-11-13 ENCOUNTER — Other Ambulatory Visit: Payer: Self-pay | Admitting: Nephrology

## 2012-11-14 ENCOUNTER — Ambulatory Visit: Payer: Medicare Other | Attending: Nephrology

## 2012-11-14 ENCOUNTER — Other Ambulatory Visit: Payer: Self-pay | Admitting: Nephrology

## 2012-11-14 DIAGNOSIS — N289 Disorder of kidney and ureter, unspecified: Secondary | ICD-10-CM | POA: Diagnosis not present

## 2012-11-14 DIAGNOSIS — R9389 Abnormal findings on diagnostic imaging of other specified body structures: Secondary | ICD-10-CM | POA: Diagnosis not present

## 2012-11-15 DIAGNOSIS — N184 Chronic kidney disease, stage 4 (severe): Secondary | ICD-10-CM | POA: Diagnosis not present

## 2012-11-15 DIAGNOSIS — I129 Hypertensive chronic kidney disease with stage 1 through stage 4 chronic kidney disease, or unspecified chronic kidney disease: Secondary | ICD-10-CM | POA: Diagnosis not present

## 2012-11-15 DIAGNOSIS — N179 Acute kidney failure, unspecified: Secondary | ICD-10-CM | POA: Diagnosis not present

## 2012-11-15 DIAGNOSIS — R809 Proteinuria, unspecified: Secondary | ICD-10-CM | POA: Diagnosis not present

## 2012-11-18 DIAGNOSIS — D49519 Neoplasm of unspecified behavior of unspecified kidney: Secondary | ICD-10-CM

## 2012-11-18 HISTORY — DX: Neoplasm of unspecified behavior of unspecified kidney: D49.519

## 2012-11-20 ENCOUNTER — Inpatient Hospital Stay
Admission: RE | Admit: 2012-11-20 | Discharge: 2012-11-20 | Disposition: A | Discharge status: Home / Self Care | Payer: Medicare Other | Source: Ambulatory Visit | Attending: Urology | Admitting: Urology

## 2012-11-20 DIAGNOSIS — Z7901 Long term (current) use of anticoagulants: Secondary | ICD-10-CM | POA: Diagnosis not present

## 2012-11-20 DIAGNOSIS — D4100 Neoplasm of uncertain behavior of unspecified kidney: Secondary | ICD-10-CM | POA: Diagnosis not present

## 2012-11-20 DIAGNOSIS — D4120 Neoplasm of uncertain behavior of unspecified ureter: Secondary | ICD-10-CM | POA: Insufficient documentation

## 2012-11-20 LAB — PT/INR
PT INR: 1.1 (ref 0.9–1.1)
PT: 13.7 (ref 12.6–15.0)

## 2012-11-20 LAB — BASIC METABOLIC PANEL
BUN: 52 mg/dL — ABNORMAL HIGH (ref 6.0–20.0)
CO2: 22 (ref 21–30)
Calcium: 9.5 mg/dL (ref 7.9–10.6)
Chloride: 107 (ref 96–109)
Creatinine: 2.2 mg/dL — ABNORMAL HIGH (ref 0.4–1.5)
Glucose: 120 mg/dL — ABNORMAL HIGH (ref 70–100)
Potassium: 5 (ref 3.5–5.3)
Sodium: 138 (ref 135–146)

## 2012-11-20 LAB — CBC
Hematocrit: 38.2 % (ref 37.0–47.0)
Hgb: 11.9 g/dL — ABNORMAL LOW (ref 12.0–16.0)
MCH: 27.9 pg — ABNORMAL LOW (ref 28.0–32.0)
MCHC: 31.2 g/dL — ABNORMAL LOW (ref 32.0–36.0)
MCV: 89.7 fL (ref 80.0–100.0)
MPV: 12 fL (ref 9.4–12.3)
Nucleated RBC: 0 (ref 0–1)
Platelets: 240 (ref 140–400)
RBC: 4.26 (ref 4.20–5.40)
RDW: 15 % (ref 12–15)
WBC: 10.58 (ref 3.50–10.80)

## 2012-11-20 LAB — HEMOLYSIS INDEX: Hemolysis Index: 5 (ref 0–9)

## 2012-11-20 LAB — PTT (SOFT): PTT: 26 (ref 23–37)

## 2012-11-20 LAB — GFR: EGFR: 26.4

## 2012-11-23 ENCOUNTER — Telehealth: Payer: Self-pay | Admitting: Interventional Radiology and Diagnostic Radiology

## 2012-11-23 ENCOUNTER — Other Ambulatory Visit: Payer: Self-pay

## 2012-11-23 DIAGNOSIS — N289 Disorder of kidney and ureter, unspecified: Secondary | ICD-10-CM | POA: Diagnosis not present

## 2012-11-23 DIAGNOSIS — I10 Essential (primary) hypertension: Secondary | ICD-10-CM | POA: Diagnosis not present

## 2012-11-23 DIAGNOSIS — Z01818 Encounter for other preprocedural examination: Secondary | ICD-10-CM | POA: Diagnosis not present

## 2012-11-23 DIAGNOSIS — E119 Type 2 diabetes mellitus without complications: Secondary | ICD-10-CM | POA: Diagnosis not present

## 2012-11-23 DIAGNOSIS — N183 Chronic kidney disease, stage 3 unspecified: Secondary | ICD-10-CM | POA: Diagnosis not present

## 2012-11-23 DIAGNOSIS — J449 Chronic obstructive pulmonary disease, unspecified: Secondary | ICD-10-CM | POA: Diagnosis not present

## 2012-11-23 NOTE — Telephone Encounter (Signed)
Message copied by Obie Dredge on Thu Nov 23, 2012  2:06 PM  ------       Message from: Alfonse Flavors       Created: Wed Nov 22, 2012 12:23 PM       Regarding: new consult         Consult from Dr Graciela Husbands for renal mass biopsy.              Can discuss ablation at the time of the consult  but Dr Graciela Husbands wants her to follow up with him after the biopsy prior to scheduling any intervention, he is considering a partial nephrectomy

## 2012-11-27 ENCOUNTER — Ambulatory Visit: Payer: Medicare Other

## 2012-11-28 ENCOUNTER — Ambulatory Visit: Payer: Medicare Other | Attending: Urology | Admitting: Interventional Radiology and Diagnostic Radiology

## 2012-11-28 VITALS — BP 138/55 | HR 60 | Ht 63.0 in | Wt 212.0 lb

## 2012-11-28 DIAGNOSIS — N289 Disorder of kidney and ureter, unspecified: Secondary | ICD-10-CM | POA: Diagnosis not present

## 2012-11-28 DIAGNOSIS — IMO0001 Reserved for inherently not codable concepts without codable children: Secondary | ICD-10-CM | POA: Insufficient documentation

## 2012-11-28 DIAGNOSIS — E119 Type 2 diabetes mellitus without complications: Secondary | ICD-10-CM | POA: Insufficient documentation

## 2012-11-28 DIAGNOSIS — Z8673 Personal history of transient ischemic attack (TIA), and cerebral infarction without residual deficits: Secondary | ICD-10-CM | POA: Insufficient documentation

## 2012-11-28 DIAGNOSIS — Z794 Long term (current) use of insulin: Secondary | ICD-10-CM | POA: Insufficient documentation

## 2012-11-28 DIAGNOSIS — E785 Hyperlipidemia, unspecified: Secondary | ICD-10-CM | POA: Insufficient documentation

## 2012-11-28 DIAGNOSIS — N183 Chronic kidney disease, stage 3 unspecified: Secondary | ICD-10-CM | POA: Insufficient documentation

## 2012-11-28 DIAGNOSIS — J4489 Other specified chronic obstructive pulmonary disease: Secondary | ICD-10-CM | POA: Insufficient documentation

## 2012-11-28 DIAGNOSIS — I129 Hypertensive chronic kidney disease with stage 1 through stage 4 chronic kidney disease, or unspecified chronic kidney disease: Secondary | ICD-10-CM | POA: Insufficient documentation

## 2012-11-28 NOTE — Progress Notes (Addendum)
[facility]  CV[facility]   Consultation Note     Referring Clinician: Dr, Nonda Lou    HPI/Reason for Consultation:   Shelby Barron presents in CVIR for evaluation of a left renal mass. She is a 72 y.o. female with a complex medical history including Stage III renal disease, hypertension, diabetes, cerebrovascular disease s/p CVA, COPD and hyperlipidemia. During routine surveillance, her serum creatinine was elevated and she was referred to see her nephrologist. Renal ultrasound revealed a mass in the left kidney. MRI confirmed the lesion and the patient was sent to CVIR from her urologist for biopsy. She denies any past history of cancer or malignancy. Currently, she denies CP, SOB, abdominal pain, fever/chills, recent infection or UTI, gross hematuria, dysuria or N/V/D. She is with her husband and adult daughter for consultation.    Past Medical History:     Past Medical History   Diagnosis Date   . Hypertension    . CVA (cerebral infarction) Jan 12, 2003   . Hyperlipidemia    . Neuropathy of hand    . Diabetic nephropathy    . H/O: gout    . Acid reflux    . Asthma, well controlled    . SOB (shortness of breath)    . COPD (chronic obstructive pulmonary disease)    . Diabetes    . Fibroids    . Chronic lower back pain    . Gout      Past Surgical History:     Past Surgical History   Procedure Date   . Hernia repair    . Appendectomy    . Hysterectomy      Allergies:     Allergies   Allergen Reactions   . Sulfa Antibiotics      Medications:     Current Outpatient Prescriptions   Medication Sig Dispense Refill   . amLODIPine (NORVASC) 10 MG tablet Take 10 mg by mouth daily.       . clopidogrel (PLAVIX) 75 MG tablet Take 75 mg by mouth daily.         . Febuxostat (ULORIC PO) Take by mouth.       . fluticasone-salmeterol (ADVAIR HFA) 115-21 MCG/ACT inhaler Inhale 2 puffs into the lungs as needed.       . furosemide (LASIX) 20 MG tablet Take 20 mg by mouth 2 (two) times daily.         .  insulin aspart (NOVOLOG) 100 UNIT/ML injection Inject 5-10 Units into the skin every evening.       . insulin glargine (LANTUS) 100 UNIT/ML injection Inject 20 Units into the skin every 12 (twelve) hours.       Marland Kitchen labetalol (NORMODYNE) 200 MG tablet Take 200 mg by mouth 2 (two) times daily.       Marland Kitchen olmesartan (BENICAR) 40 MG tablet Take 40 mg by mouth daily.         . pantoprazole (PROTONIX) 40 MG tablet Take 40 mg by mouth daily.       . pregabalin (LYRICA) 50 MG capsule Take 50 mg by mouth daily.       . sitaGLIPtin (JANUVIA) 100 MG tablet Take 100 mg by mouth daily.         . Zolpidem Tartrate (AMBIEN PO) Take by mouth.         . [DISCONTINUED] Calcium Carbonate Antacid (TUMS PO) Take by mouth continuous prn.         . [  DISCONTINUED] carvedilol (COREG CR) 40 MG 24 hr capsule Take 40 mg by mouth daily.         . [DISCONTINUED] diltiazem (CARDIZEM CD) 360 MG 24 hr capsule Take 360 mg by mouth daily.         . [DISCONTINUED] Famotidine (PEPCID PO) Take by mouth.         . [DISCONTINUED] glipiZIDE (GLUCOTROL) 5 MG tablet Take 5 mg by mouth 2 (two) times daily before meals.         . [DISCONTINUED] rosuvastatin (CRESTOR) 10 MG tablet Take 10 mg by mouth daily.         . [DISCONTINUED] venlafaxine (EFFEXOR) 75 MG tablet Take 75 mg by mouth 2 (two) times daily.           Family History:     Family History   Problem Relation Age of Onset   . Cancer Mother    . Heart disease Father    . Cancer Maternal Aunt    . Diabetes Maternal Aunt    . Cancer Paternal Aunt      Social History:     History     Social History   . Marital Status: Married     Spouse Name: N/A     Number of Children: N/A   . Years of Education: N/A     Occupational History   . Not on file.     Social History Main Topics   . Smoking status: Former Games developer   . Smokeless tobacco: Never Used   . Alcohol Use: No   . Drug Use: No   . Sexually Active: Not on file     Other Topics Concern   . Not on file     Social History Narrative   . No narrative on file     ROS:    History obtained from chart review, patient and adult daughter.    Pertinent items are noted in HPI.    Physical Exam:   General - Well developed, well nourished, well groomed obese female in NAD  Neuro - A+Ox3  Heart - RRR, S1S2  Lungs - CTA bilaterally  Abdomen - Obese, soft, NT/ND, + bowel sounds  Extremities - Warm, no gross edema    Labs:   Dated 11/20/12:    WBC 10.5  HBG 11.9  HCT 38.2  Platelets 240    BUN 52  Creatinine 2.2  GFR 26.4    PT 13.7  INR 1.1    Rads:   MRI Abdomen dated 11/14/12 from an outside facility reveals a left renal mass measuring 2.8 x 3.3 cm.    Assessment:   Left renal mass    Plan:   Imaging reviewed.  We discussed the nature of renal lesions, possibility of malignancy and rationale for medical and conservative treatment. We also discussed more definitive treatment options including surgery and regional tumor targeted therapy.  Prior to planning for definitive treatment, we recommend left renal mass biopsy. Biopsy, it's risks and benefits were discussed at length.   The patient will follow up with her Urologist post biopsy to discuss the results and plan for definitive treatment. If surgery is not an option, we would gladly see Mrs. Cozzolino back in CVIR to review and discuss regional treatment modalities.    I that regard, we have initiated insurance clearance and look forward to scheduling the patient for her renal biopsy at her earliest convenience.      More than half of  the 30 minute visit was spent in discussion and coordination of care.    Signed by: Gilford Raid, NP  CVIR Department   [facility]  [phone]      I have evaluated the patient and agree with the above documented evaluation and plan.    Signed by: Hope Pigeon, MD  CVIR Department  [phone]

## 2012-11-30 NOTE — Telephone Encounter (Signed)
completed

## 2012-12-01 ENCOUNTER — Ambulatory Visit
Admission: RE | Admit: 2012-12-01 | Discharge: 2012-12-01 | Disposition: A | Discharge status: Home / Self Care | Payer: Medicare Other | Source: Ambulatory Visit | Attending: Body Imaging | Admitting: Body Imaging

## 2012-12-01 DIAGNOSIS — N289 Disorder of kidney and ureter, unspecified: Secondary | ICD-10-CM | POA: Diagnosis not present

## 2012-12-01 LAB — TYPE AND SCREEN
AB Screen Gel: NEGATIVE
ABO Rh: O POS

## 2012-12-04 ENCOUNTER — Ambulatory Visit
Admission: RE | Admit: 2012-12-04 | Discharge: 2012-12-04 | Disposition: A | Discharge status: Home / Self Care | Payer: Medicare Other | Source: Ambulatory Visit | Attending: Interventional Radiology and Diagnostic Radiology | Admitting: Interventional Radiology and Diagnostic Radiology

## 2012-12-04 ENCOUNTER — Ambulatory Visit: Payer: Self-pay

## 2012-12-04 ENCOUNTER — Ambulatory Visit: Payer: Medicare Other

## 2012-12-04 ENCOUNTER — Ambulatory Visit: Payer: Medicare Other | Admitting: Interventional Radiology and Diagnostic Radiology

## 2012-12-04 DIAGNOSIS — N289 Disorder of kidney and ureter, unspecified: Secondary | ICD-10-CM | POA: Diagnosis not present

## 2012-12-04 DIAGNOSIS — J449 Chronic obstructive pulmonary disease, unspecified: Secondary | ICD-10-CM | POA: Diagnosis not present

## 2012-12-04 DIAGNOSIS — E785 Hyperlipidemia, unspecified: Secondary | ICD-10-CM | POA: Diagnosis not present

## 2012-12-04 DIAGNOSIS — C649 Malignant neoplasm of unspecified kidney, except renal pelvis: Secondary | ICD-10-CM | POA: Diagnosis not present

## 2012-12-04 DIAGNOSIS — E119 Type 2 diabetes mellitus without complications: Secondary | ICD-10-CM | POA: Diagnosis not present

## 2012-12-04 DIAGNOSIS — I10 Essential (primary) hypertension: Secondary | ICD-10-CM | POA: Diagnosis not present

## 2012-12-04 DIAGNOSIS — J4489 Other specified chronic obstructive pulmonary disease: Secondary | ICD-10-CM | POA: Insufficient documentation

## 2012-12-04 LAB — HEMOGLOBIN AND HEMATOCRIT, BLOOD
Hematocrit: 32.7 % — ABNORMAL LOW (ref 37.0–47.0)
Hematocrit: 35.1 % — ABNORMAL LOW (ref 37.0–47.0)
Hgb: 10.3 g/dL — ABNORMAL LOW (ref 12.0–16.0)
Hgb: 11 g/dL — ABNORMAL LOW (ref 12.0–16.0)

## 2012-12-04 MED ORDER — FENTANYL CITRATE 0.05 MG/ML IJ SOLN
INTRAMUSCULAR | Status: AC | PRN
Start: 2012-12-04 — End: 2012-12-04
  Administered 2012-12-04: 25 ug via INTRAVENOUS

## 2012-12-04 MED ORDER — MIDAZOLAM HCL 2 MG/2ML IJ SOLN
INTRAMUSCULAR | Status: AC | PRN
Start: 2012-12-04 — End: 2012-12-04
  Administered 2012-12-04: .5 mg via INTRAVENOUS

## 2012-12-04 MED ORDER — FENTANYL CITRATE 0.05 MG/ML IJ SOLN
INTRAMUSCULAR | Status: AC
Start: 2012-12-04 — End: 2012-12-04
  Filled 2012-12-04: qty 5

## 2012-12-04 MED ORDER — MIDAZOLAM HCL 5 MG/5ML IJ SOLN
INTRAMUSCULAR | Status: AC
Start: 2012-12-04 — End: 2012-12-04
  Filled 2012-12-04: qty 5

## 2012-12-04 MED ORDER — MIDAZOLAM HCL 2 MG/2ML IJ SOLN
INTRAMUSCULAR | Status: AC | PRN
Start: 2012-12-04 — End: 2012-12-04
  Administered 2012-12-04: 1 mg via INTRAVENOUS

## 2012-12-04 MED ORDER — ATROPINE SULFATE 0.1 MG/ML IJ SOLN
INTRAMUSCULAR | Status: DC
Start: 2012-12-04 — End: 2012-12-04
  Filled 2012-12-04: qty 10

## 2012-12-04 MED ORDER — NALOXONE HCL 0.4 MG/ML IJ SOLN
INTRAMUSCULAR | Status: DC
Start: 2012-12-04 — End: 2012-12-04
  Filled 2012-12-04: qty 1

## 2012-12-04 MED ORDER — LIDOCAINE HCL (PF) 1 % IJ SOLN
INTRAMUSCULAR | Status: AC
Start: 2012-12-04 — End: 2012-12-04
  Filled 2012-12-04: qty 30

## 2012-12-04 MED ORDER — FENTANYL CITRATE 0.05 MG/ML IJ SOLN
INTRAMUSCULAR | Status: AC | PRN
Start: 2012-12-04 — End: 2012-12-04
  Administered 2012-12-04: 50 ug via INTRAVENOUS

## 2012-12-04 MED ORDER — FLUMAZENIL 0.5 MG/5ML IV SOLN
INTRAVENOUS | Status: DC
Start: 2012-12-04 — End: 2012-12-04
  Filled 2012-12-04: qty 5

## 2012-12-04 NOTE — Progress Notes (Signed)
H/H drawn.  Pt A&Ox3.  Pt rates pain 0/10 on pain scale.  Vital signs stable.  Site is dry and intact.  Pt provided with verbal and written discharge instructions.  All questions answered and pt stated an understanding.  Pt ambulated to restroom.  No bleeding noted.  Pt provided with po fluids and crackers.

## 2012-12-04 NOTE — Progress Notes (Signed)
Second h/h drawn and came back stable.  Site remains dry and intact.  Vital signs stable. Pt rates pain 0/10 on pain scale.  Dr papadouris in to speak with pt.  Iv d/c'd without complications.  Pt taken out front via wheelchair and discharged to husband.

## 2012-12-04 NOTE — Discharge Instructions (Signed)
[facility]   Department of Cardiovascular and Interventional Radiology  [phone]    Renal Biopsy      You have been observed overnight following your procedure for any sign of bleeding. Your blood work is stable and there is no indication of any bleeding. Please continue to monitor the biopsy puncture site and urine for any sign of bleeding. Please call us immediately if you observe any sign of bleeding or experience any sudden nausea, dizziness, vomiting, pain or fever > 101.0  at [phone]. If you feel it is a life threatening emergency, please call 911 or go directly to the nearest emergency room.    You may experience mild local discomfort at the biopsy site for a few days. You may take an over the counter analgesic such as Tylenol or Ibuprofen for relief, unless otherwise directed by your physician. Please avoid Aspirin for 72 hours.    Please follow-up with your referring physician for your test results. The pathology results should arrive to their office in approximately 1 to 2 weeks.    You may resume all of your prior medications, and light activity for the next three days. Please refrain from exertion or physical activity for one week.

## 2012-12-04 NOTE — Progress Notes (Signed)
Patient tolerated left renal biopsy procedure well. Stable to [facility] 11. Telephone report called to NP Turker. BS handoff given to [facility] RN Victorino Dike who is assuming care of the patient. No c/o pain or nausea at this time. VSS on NC 2L. Left posterior flank DRSG CDI- patient is lying on left side per MD Papadouris. H&H ordered and timed for 1145. Patient is resting on the monitor with call bell within reach. Family at the [facility]. Plan to continue to observe and monitor hemodynamic stability.

## 2012-12-04 NOTE — Brief Op Note (Signed)
[facility]  CVIR   Brief Op Note     Physician(s): Keeon Zurn C Romell Wolden, MD    Assistant(s):  None    Pre-operative Diagnosis: renal mass    Post-operative Diagnosis: Diagnosis is same as preop diagnosis    Procedure(s) Performed: Image guided renal biopsy                                                                                                                                                                                                                 Anesthesia:  Local with 1% lidocaine local and Moderate Sedation    Complications: None    Estimated Blood Loss:  Minimal    Blood Administered:  None    Fluid Aministered:  Per Nursing    Tubes and Drains: None    Implant(s):  None    Specimen(s): 1 18 gauge kidney biopsy was obtained.    Findings: CT used to guide 18 gauge core biopsies of the left kidney.     Patient was transferred from the procedure room to the recovery area in stable condition without apparent complication.  Procedure note dictated.    Signed by: Hope Pigeon, MD  CVIR Department  [facility] [phone]  [facility] [phone]

## 2012-12-04 NOTE — Progress Notes (Signed)
[facility]  CVIR          I have evaluated the patient and agree with the above documented evaluation and plan.    Signed by: Coley Littles C Shem Plemmons, MD  CVIR Department  [phone]

## 2012-12-04 NOTE — Progress Notes (Signed)
Pt arrived to holding area post procedure.  Pt A&Ox3.  Pt rates pain 0/10 on pain scale.  Vital signs stable.  Site is dry and intact.  Family at bedside.  Will continue to monitor pt.

## 2012-12-04 NOTE — Sedation Documentation (Signed)
1 left renal mass biopsy sample sent to the lab with Wiliam Ke

## 2012-12-04 NOTE — Progress Notes (Signed)
[facility]  [facility]   History and Physical     History and Physical:     The patient was interviewed and examined in the preoperative holding area. The previously documented history and physical from 11/28/12 was reviewed in detail and changes are none.    ASA Classification:   []    ASA 1  Healthy patient  [x]    ASA 2  Mild systemic illness  []    ASA 3  Systemic disease, though not incapacitating  []    ASA 4  Severe systemic disease that is a constant threat to life   []    ASA 5  Moribund condition, patient unexpected to live >24 hours, irrespective of procedure  []    E         Emergent procedure    Mallampati Score:   []  1  [x]  2  []  3  []  4  []  Intubated  []  Tracheostomy    Planned Anesthesia:   []  No sedation  [x]  Local  [x]  Moderate sedation  []  Deep sedation (with Anesthesiology present)  []  General Anesthesia     Airway Assesment:   [x]  Normal  [] Compromised    Diagnosis: left renal mass  Procedure: left renal mass biopsy    The patient is medically stable and appropriate to undergo the planned procedure. All risks and alternatives were discussed with the patient and she agrees to proceed.     Signed by: Roylene Reason, PA  CVIR Department  [facility] [phone]

## 2012-12-07 LAB — LAB USE ONLY - HISTORICAL SURGICAL PATHOLOGY

## 2012-12-11 DIAGNOSIS — D4100 Neoplasm of uncertain behavior of unspecified kidney: Secondary | ICD-10-CM | POA: Diagnosis not present

## 2012-12-12 ENCOUNTER — Emergency Department: Payer: Medicare Other

## 2012-12-12 ENCOUNTER — Emergency Department
Admission: EM | Admit: 2012-12-12 | Discharge: 2012-12-12 | Disposition: A | Discharge status: Home / Self Care | Payer: Medicare Other | Attending: Emergency Medicine | Admitting: Emergency Medicine

## 2012-12-12 DIAGNOSIS — M795 Residual foreign body in soft tissue: Secondary | ICD-10-CM | POA: Diagnosis not present

## 2012-12-12 DIAGNOSIS — IMO0002 Reserved for concepts with insufficient information to code with codable children: Secondary | ICD-10-CM | POA: Diagnosis not present

## 2012-12-12 DIAGNOSIS — M79609 Pain in unspecified limb: Secondary | ICD-10-CM | POA: Diagnosis not present

## 2012-12-12 DIAGNOSIS — S91309A Unspecified open wound, unspecified foot, initial encounter: Secondary | ICD-10-CM | POA: Diagnosis not present

## 2012-12-12 NOTE — ED Provider Notes (Signed)
EMERGENCY DEPARTMENT HISTORY AND PHYSICAL EXAM     Physician/Midlevel provider first contact with patient: 12/12/12 1339         Date: 12/12/2012  Patient Name: Shelby Barron    History of Presenting Illness     Chief Complaint   Patient presents with   . Foot Pain       History Provided By: pt    Chief Complaint: foot pain  Onset: 10 days ago  Location: left foot  Severity: moderate  Modifying Factors: worsens when bearing weight and ambulating  Associated Symptoms: punctured sole of left foot    Additional History: Shelby Barron is a 72 y.o. female presenting to the ED with c/o left foot pain x 10 days ago. Pt reports that she was walking barefoot 10 days ago when she felt something puncture the sole of her left foot. Pt notes that affected area began to itch and is now causing her left foot pain. Pt does not know what she stepped on but thinks that foreign object is now deep in her left sole. Pt reports that sxs worsens when bearing weight and ambulating. Pt has a hx of DM and kidney tumor. Pt also states that she has a Podiatrist in [place] but does not have one in the area.     PCP: Clarnce Flock, MD      No current facility-administered medications for this encounter.     Current Outpatient Prescriptions   Medication Sig Dispense Refill   . amLODIPine (NORVASC) 10 MG tablet Take 10 mg by mouth daily.       . clopidogrel (PLAVIX) 75 MG tablet Take 75 mg by mouth daily.         . Febuxostat (ULORIC PO) Take by mouth.       . fluticasone-salmeterol (ADVAIR HFA) 115-21 MCG/ACT inhaler Inhale 2 puffs into the lungs as needed.       . furosemide (LASIX) 20 MG tablet Take 20 mg by mouth 2 (two) times daily.         . insulin aspart (NOVOLOG) 100 UNIT/ML injection Inject 5-10 Units into the skin every evening.       . insulin glargine (LANTUS) 100 UNIT/ML injection Inject 20 Units into the skin every 12 (twelve) hours.       Marland Kitchen labetalol (NORMODYNE) 200 MG tablet Take 200 mg by mouth 2 (two) times daily.        Marland Kitchen olmesartan (BENICAR) 40 MG tablet Take 40 mg by mouth daily.         . pantoprazole (PROTONIX) 40 MG tablet Take 40 mg by mouth daily.       . pregabalin (LYRICA) 50 MG capsule Take 50 mg by mouth daily.       . sitaGLIPtin (JANUVIA) 100 MG tablet Take 100 mg by mouth daily.         . Zolpidem Tartrate (AMBIEN PO) Take by mouth.             Past History     Past Medical History:  Past Medical History   Diagnosis Date   . Hypertension    . CVA (cerebral infarction) Jan 12, 2003   . Hyperlipidemia    . Neuropathy of hand    . Diabetic nephropathy    . H/O: gout    . Acid reflux    . Asthma, well controlled    . SOB (shortness of breath)    . COPD (chronic obstructive pulmonary  disease)    . Diabetes    . Fibroids    . Chronic lower back pain    . Gout        Past Surgical History:  Past Surgical History   Procedure Date   . Hernia repair    . Appendectomy    . Hysterectomy        Family History:  Family History   Problem Relation Age of Onset   . Cancer Mother    . Heart disease Father    . Cancer Maternal Aunt    . Diabetes Maternal Aunt    . Cancer Paternal Aunt        Social History:  History   Substance Use Topics   . Smoking status: Former Games developer   . Smokeless tobacco: Never Used   . Alcohol Use: No       Allergies:  Allergies   Allergen Reactions   . Sulfa Antibiotics        Review of Systems     Review of Systems   Constitutional: Negative for fever.   HENT: Negative for nosebleeds.    Eyes: Negative for discharge.   Respiratory: Negative for cough.    Cardiovascular: Negative for chest pain.   Gastrointestinal: Negative for diarrhea.   Musculoskeletal:        + left foot pain, punctured sole of left foot   Skin: Positive for itching (sole of left foot (resolved)). Negative for rash.   Neurological: Negative for loss of consciousness.   Endo/Heme/Allergies:        + Sulfa abx allergy         Physical Exam   BP 131/60  Pulse 68  Temp 97.5 F (36.4 C)  Resp 18  Ht 1.613 m  Wt 96.163 kg  BMI 36.96 kg/m2   SpO2 100%    Physical Exam   Nursing note and vitals reviewed.  Constitutional: She is oriented to person, place, and time and well-developed, well-nourished, and in no distress. No distress.   HENT:   Head: Normocephalic and atraumatic.   Mouth/Throat: Oropharynx is clear and moist.   Eyes: EOM are normal. Pupils are equal, round, and reactive to light.   Neck: Normal range of motion. Neck supple.   Pulmonary/Chest: Effort normal. No respiratory distress.   Musculoskeletal: Normal range of motion. She exhibits tenderness. She exhibits no edema.        Palpable FB sensation sole left foot along anterior transverse arch but not superficial.  No cellulitis, no skin breakdown    Bedside U/S shows small hypoechoic site 0.5-1cm deep   Neurological: She is alert and oriented to person, place, and time. Gait normal. GCS score is 15.   Skin: Skin is warm and dry. She is not diaphoretic.   Psychiatric: Mood, memory, affect and judgment normal.         Diagnostic Study Results     Labs -     Results     ** No Results found for the last 24 hours. **          Radiologic Studies -   Radiology Results (24 Hour)     Procedure Component Value Units Date/Time    Foot Left AP Lateral and Oblique [[id number]] Collected:12/12/12 1416    Order Status:Completed  Updated:12/12/12 1421    Narrative:    HISTORY: Pain.    TECHNIQUE: Three views of the left foot were obtained.     PRIORS: 06/04/2006  FINDINGS:       The soft tissues are unremarkable. There is no evidence of  radiopaque foreign bodies. The joint spaces are maintained. There are no  evidence of acute fractures. There is normal bony alignment. There are  no concerning erosive changes. There is an approximately 4 mm plantar  calcaneal spur..      Impression:     No acute findings identified.     Georgana Curio, MD   12/12/2012 2:16 PM      .      Medical Decision Making   I am the first provider for this patient.    I reviewed the vital signs, available nursing notes, past  medical history, past surgical history, family history and social history.    Vital Signs-Reviewed the patient's vital signs.     Patient Vitals for the past 12 hrs:   BP Temp Pulse Resp   12/12/12 1336 131/60 mmHg 97.5 F (36.4 C) 68  18        Pulse Oximetry Analysis - Normal, 100% on RA    Old Medical Records: Nursing notes.     ED Course:     2:37 PM  Discussed results with pt and counseled on diagnosis, f/u plans, and signs and symptoms when to return to ED.  Pt is stable and ready for discharge.      Provider Notes: Pt presents with FB in left foot, xray negative but U/S showed ?FB approx 0.5 deep into plantar surface. Pt diabetic, FB does not appear superficial enough to remove. Referred to Podiatry for removal.       Diagnosis     Clinical Impression:   1. Foreign body in foot, left, initial encounter        _______________________________    Attestations:  This note is prepared by Glenice Laine, acting as Scribe for French Ana, MD.    French Ana, MD: The scribe's documentation has been prepared under my direction and personally reviewed by me in its entirety.  I confirm that the note above accurately reflects all work, treatment, procedures, and medical decision making performed by me.    _______________________________            French Ana, MD  12/12/12 1742

## 2012-12-12 NOTE — ED Notes (Signed)
C/o left foot pain x 10 days. States usually walk barefoot in the house and something punctured her right sole 10 days ago.  First was itchy and burning.  C/o bump and pain.

## 2012-12-12 NOTE — Discharge Instructions (Signed)
Please follow up with Dr. Murrell Converse (Podiatry) for possible removal.    Foreign Body, NOS    You have been seen for a foreign object in your left foot.    Some symptoms of foreign body placement are:   Pain where the foreign body was placed.   Infection in the area where the foreign body was placed. This may cause redness, drainage of blood or pus, swelling and fever.   Injury to the internal organs near where the foreign body was placed.      YOU SHOULD SEEK MEDICAL ATTENTION IMMEDIATELY, EITHER HERE OR AT THE NEAREST EMERGENCY DEPARTMENT, IF ANY OF THE FOLLOWING OCCURS:   You have any other problems or concerns.   You have more pain.   You have any signs of infection. These include redness, pus, pain, swelling or fever (temperature greater than 100.1F or 38C).

## 2012-12-20 ENCOUNTER — Telehealth: Payer: Self-pay | Admitting: Internal Medicine

## 2012-12-20 NOTE — Telephone Encounter (Signed)
Patient has been in [place]. She saw doctors there and had many tests. She had a scan and was found to have a kidney mass that has been biopsied. She is waiting on the results of this. She states she was told to see her GI here for abdominal pain. Explained that Dr. Olevia Perches is our hospital MD this week. She states she will only be here in [place] on Thursday and Friday this week. Scheduled with Nicoletta Ba, PA on 12/21/12 at 11:00 AM. She states she will bring her records from [place] with her.

## 2012-12-20 NOTE — Telephone Encounter (Signed)
Per pt she has been in [place] and has seen a Gi there. They have done multiple tests on her which she is currently waiting on biopsy's for. She is going to be heading back to [place] today around 10:00 a.m and says she has to see Dr. Olevia Perches because she is really sick. She says if you call back after 10 call her cell number.

## 2012-12-20 NOTE — Telephone Encounter (Signed)
OK to see Amy, PA

## 2012-12-20 NOTE — Telephone Encounter (Signed)
.  pat

## 2012-12-21 ENCOUNTER — Encounter: Payer: Self-pay | Admitting: Physician Assistant

## 2012-12-21 ENCOUNTER — Ambulatory Visit (INDEPENDENT_AMBULATORY_CARE_PROVIDER_SITE_OTHER): Payer: Medicare Other | Admitting: Physician Assistant

## 2012-12-21 ENCOUNTER — Ambulatory Visit (INDEPENDENT_AMBULATORY_CARE_PROVIDER_SITE_OTHER): Payer: Medicare Other | Admitting: Internal Medicine

## 2012-12-21 ENCOUNTER — Encounter: Payer: Self-pay | Admitting: Internal Medicine

## 2012-12-21 VITALS — BP 114/58 | HR 64 | Temp 97.4°F | Resp 10 | Wt 218.7 lb

## 2012-12-21 VITALS — BP 102/50 | HR 64 | Ht 63.0 in | Wt 214.0 lb

## 2012-12-21 DIAGNOSIS — R1032 Left lower quadrant pain: Secondary | ICD-10-CM | POA: Diagnosis not present

## 2012-12-21 DIAGNOSIS — M545 Low back pain, unspecified: Secondary | ICD-10-CM

## 2012-12-21 DIAGNOSIS — E559 Vitamin D deficiency, unspecified: Secondary | ICD-10-CM

## 2012-12-21 MED ORDER — DICYCLOMINE HCL 10 MG PO CAPS: 10.0000 mg | ORAL_CAPSULE | ORAL | Status: DC

## 2012-12-21 NOTE — Patient Instructions (Signed)
We have sent the following medications to your pharmacy for you to pick up at your convenience: Bentyl 10 mg before breakfast every morning.  Please continue your prebiotic.  Follow up with Dr Olevia Perches as needed.

## 2012-12-21 NOTE — Progress Notes (Signed)
Subjective:     Patient ID: Megan [name], female   DOB: 10-03-1940, 72 y.o.   MRN: [id number]  HPI Megan [name] is a pleasant 72 year old woman returning for f/u for DM2, dx 1995, insulin-dependent, uncontrolled, with complications (chronic kidney disease). She is here with her husband.  She was dx with L kidney cancer in 10/2012 >> s/p Bx >> pathology inconclusive >> Bx will be resent to [facility] >> pending. She will see Dr Posey Pronto (nephrology) soon.   She still has severe diarrhea and cramps.   Latest HbA1C: 09/27/12: HbA1c 6.6% - per records that she brings from [place] Lab Results  Component Value Date   HGBA1C 9.1* 06/16/2012   HGBA1C 9.3* 05/03/2012   HGBA1C 8.9* 02/03/2012   She takes: - 20 bid units Lantus  - Novolog 5 with dinner + SSI target 150, ISF 30, most time, she forgets to take the insulin  She stopped Januvia.  She was previously on Invokana 100 mg, but we need to stop it due to CKD. She was also previously on Glipizide. She was previously on Metformin and that helped tremendously - but had to be taken off Metformin b/c CKD.  She took Welchol in the past, now off.  She checks sugars, but did not bring a sugar log at this time. Per her recall, her sugars are: - am: 160 >> 130-165 >> 95-140, but in last 2 weeks had 4 out of 22 sugars 170-266 >> 100 -125 >> 83-low 100s - before lunch: 119-132 >> 150s >> n/c - 2h after lunch: 147 and 256 >> not checking - before dinner: now 99-102 >> 125-150 (high around 150 if snacking before) >> n/c - 2h after dinner: now 118-211 >> not checking >> n/c - hs: 240 >> 200-235 >> 118-227 >> 180s as she grazes  No lows other than the 70, has hypoglycemia awareness.   No neuropathy. Last BUN/Cr: 10/19/2012: BUN 34, Cr 1.8, PTH 180  TSH 0.91 09/27/2012: BUN: 78, Cr 2.8  Lab Results  Component Value Date   BUN 48* 07/28/2012   BUN 48* 07/28/2012   CREATININE 2.7* 07/28/2012   CREATININE 2.7* 07/28/2012  Last ACR 38.29 10/19/2012 (  outside tests) Last eye exam ~9 mo ago - [name]. 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  I reviewed pt's medications, allergies, PMH, social hx, family hx and no changes required except as above.  Review of Systems Constitutional: + weight gain, + fatigue, + hot flushes/chills, + poor sleep, + nocturia Eyes: + blurry vision, no xerophthalmia ENT: no sore throat, no nodules palpated in throat, no dysphagia/ no odynophagia, + hoarseness, + tinnitus Cardiovascular: still has CP/+ SOB/palpitations/+ leg swelling Respiratory: no cough/+ SOB/+ wheezing Gastrointestinal: no nausea/no V/+ D Musculoskeletal: + muscle/+ joint aches Skin: + hair loss Neurological: no tremors/numbness/tingling/dizziness Low libido  Objective:   Physical Exam BP 114/58  Pulse 64  Temp(Src) 97.4 F (36.3 C) (Oral)  Resp 10  Wt 218 lb 11.2 oz (99.202 kg)  SpO2 97% Wt Readings from Last 3 Encounters:  12/21/12 218 lb 11.2 oz (99.202 kg)  12/21/12 214 lb (97.07 kg)  07/28/12 216 lb (97.977 kg)   Constitutional: overweight, in NAD Eyes: PERRLA, EOMI, no exophthalmos ENT: moist mucous membranes, no thyromegaly, no cervical lymphadenopathy Cardiovascular: RRR, No MRG Respiratory: CTA B Gastrointestinal: abdomen soft, NT, ND, BS+ Musculoskeletal: no deformities, strength intact in all 4 Skin: moist, warm, no rashes  Assessment:     1. DM2,  poorly controlled, insulin-dependent, with complications - CKD stage 3    Plan:  Pt with long-standing insulin-dependent DM2, with the latest hemoglobin A1c at 6.6%, decreased from 9.1%! Excellent improvement! She checks her sugars but did not bring a log. Per her recall, the sugars are great in am and they increase at bedtime, in a staircase pattern. - at last visit, I suggested that she added NovoLog insulin with all of her meals, but she was only taking it with dinner, if he does not forget. I advised her to - continue NovoLog  insulin 5 units with dinner + SSI (maintained previous sliding scale). I strongly advised her to try not to forget the NovoLog, and to take it with dinner if she cannot remember to take it before. - continue Lantus to 20 units 2x a day - she had the flu vaccine this season - she is up-to-date with flu vaccine - I will see her back in 3 months with her sugar log - She is cleared for kidney surgery, in case she would need to undergo this, from the diabetes point of view

## 2012-12-21 NOTE — Patient Instructions (Addendum)
Please continue: Lantus 20 units 2x a day NovoLog 5 units before dinner Use the following Sliding scale: - 150-180: + 1 unit  - 181-210: + 2 units - 211-240: + 3 units  - >240: + 4 units Please come back for a follow-up appointment in 3 months with your sugar log.  You are cleared for kidney surgery from the diabetes point of view.  HANG IN THERE!

## 2012-12-22 ENCOUNTER — Ambulatory Visit (INDEPENDENT_AMBULATORY_CARE_PROVIDER_SITE_OTHER): Payer: Medicare Other | Admitting: Internal Medicine

## 2012-12-22 ENCOUNTER — Encounter: Payer: Self-pay | Admitting: Physician Assistant

## 2012-12-22 ENCOUNTER — Encounter: Payer: Self-pay | Admitting: Internal Medicine

## 2012-12-22 VITALS — BP 126/64 | HR 75 | Temp 97.5°F | Resp 16 | Ht 63.5 in | Wt 218.0 lb

## 2012-12-22 DIAGNOSIS — N2889 Other specified disorders of kidney and ureter: Secondary | ICD-10-CM

## 2012-12-22 DIAGNOSIS — N189 Chronic kidney disease, unspecified: Secondary | ICD-10-CM

## 2012-12-22 DIAGNOSIS — N289 Disorder of kidney and ureter, unspecified: Secondary | ICD-10-CM

## 2012-12-22 DIAGNOSIS — I129 Hypertensive chronic kidney disease with stage 1 through stage 4 chronic kidney disease, or unspecified chronic kidney disease: Secondary | ICD-10-CM

## 2012-12-22 NOTE — Progress Notes (Signed)
Subjective:    Patient ID: Megan [name], female    DOB: 1940-06-05, 72 y.o.   MRN: [id number]  Hypertension This is a chronic problem. The current episode started more than 1 year ago. The problem has been gradually improving since onset. The problem is controlled. Associated symptoms include anxiety, peripheral edema and shortness of breath. Pertinent negatives include no blurred vision, chest pain, headaches, malaise/fatigue, neck pain, orthopnea, palpitations, PND or sweats. Past treatments include calcium channel blockers, angiotensin blockers and diuretics. The current treatment provides moderate improvement. Compliance problems include diet and exercise.  Hypertensive end-organ damage includes kidney disease. Identifiable causes of hypertension include chronic renal disease.      Review of Systems  Constitutional: Negative for fever, chills, malaise/fatigue, diaphoresis, appetite change and fatigue.  HENT: Negative.   Eyes: Negative.  Negative for blurred vision.  Respiratory: Positive for shortness of breath. Negative for apnea, cough, choking, chest tightness, wheezing and stridor.   Cardiovascular: Negative.  Negative for chest pain, palpitations, orthopnea, leg swelling and PND.  Gastrointestinal: Negative.  Negative for nausea, vomiting, abdominal pain, diarrhea, constipation and blood in stool.  Endocrine: Negative.   Genitourinary: Negative.   Musculoskeletal: Negative.  Negative for arthralgias, myalgias, neck pain and neck stiffness.  Skin: Negative.   Allergic/Immunologic: Negative.   Neurological: Negative.  Negative for dizziness, tremors, speech difficulty, weakness, light-headedness and headaches.  Hematological: Negative.  Negative for adenopathy. Does not bruise/bleed easily.  Psychiatric/Behavioral: Negative.        Objective:   Physical Exam  Vitals reviewed. Constitutional: She is oriented to person, place, and time. She appears well-developed and  well-nourished. No distress.  HENT:  Head: Normocephalic and atraumatic.  Mouth/Throat: Oropharynx is clear and moist. No oropharyngeal exudate.  Eyes: Conjunctivae are normal. Right eye exhibits no discharge. Left eye exhibits no discharge. No scleral icterus.  Neck: Normal range of motion. Neck supple. No JVD present. No tracheal deviation present. No thyromegaly present.  Cardiovascular: Normal rate, regular rhythm, normal heart sounds and intact distal pulses.  Exam reveals no gallop.   No murmur heard. Pulmonary/Chest: Effort normal and breath sounds normal. No stridor. No respiratory distress. She has no wheezes. She has no rales. She exhibits no tenderness.  Abdominal: Soft. Bowel sounds are normal. She exhibits no distension and no mass. There is no tenderness. There is no rebound and no guarding.  Musculoskeletal: Normal range of motion. She exhibits edema (trace pitting edema in BLE). She exhibits no tenderness.  Lymphadenopathy:    She has no cervical adenopathy.  Neurological: She is oriented to person, place, and time.  Skin: Skin is warm and dry. No rash noted. She is not diaphoretic. No erythema. No pallor.  Psychiatric: She has a normal mood and affect. Her behavior is normal. Judgment and thought content normal.     Lab Results  Component Value Date   WBC 8.1 07/28/2012   HGB 11.4* 07/28/2012   HCT 33.6* 07/28/2012   PLT 207.0 07/28/2012   GLUCOSE 134* 07/28/2012   GLUCOSE 134* 07/28/2012   CHOL 151 05/03/2012   TRIG 196.0* 05/03/2012   HDL 45.40 05/03/2012   LDLDIRECT 91.4 05/13/2011   LDLCALC 66 05/03/2012   ALT 72* 07/28/2012   AST 57* 07/28/2012   NA 135 07/28/2012   NA 135 07/28/2012   K 4.6 07/28/2012   K 4.6 07/28/2012   CL 105 07/28/2012   CL 105 07/28/2012   CREATININE 2.7* 07/28/2012   CREATININE 2.7* 07/28/2012   BUN  48* 07/28/2012   BUN 48* 07/28/2012   CO2 22 07/28/2012   CO2 22 07/28/2012   TSH 0.72 05/03/2012   HGBA1C 9.1* 06/16/2012   MICROALBUR 11.8* 02/08/2012         Assessment & Plan:

## 2012-12-22 NOTE — Progress Notes (Signed)
Subjective:    Patient ID: Megan [name], female    DOB: 1940/05/31, 71 y.o.   MRN: [id number]  HPI  Megan [name] is a pleasant 72 year old Afro-American female known to Dr. Delfin Edis She has history of adenomatous colon polyps and family history of colon cancer. Also with chronic GERD history of previous CVA, coronary artery disease, hypertension,and moderate chronic kidney disease. She was last seen in June of 2014 at colonoscopy. She had 5 polyps removed all between 5 and 9 mm and biopsies were consistent with tubular adenomas without dysplasia. She also is moderate left-sided diverticulosis. Patient comes in today to ask about her left-sided back pain and whether this may be associated with her: Or with polyps. She has had intermittent left lower back pain for at least a year and says that she gets bad episodes of back pain as well as extremity cramping whenever she has an episode of diarrhea. She says she's having almost weekly episodes of urgency and then diarrhea which are followed by severe extremity cramping. She says she gets cramping so bad in her legs and pain in her left back that she is curled up in a ball. She takes quinine water and another natural supplement for the cramps. She says in between these episodes her bowel movements are fairly normal. Has not noted any melena or hematochezia and says she has no abdominal pain in between these episodes either. She has undergone recent evaluation through her primary care physician and on abdominal  MRIhas been found to have a focal mass in the medial left kidney highly suspicious for tumor. This measures 3.2 x 2.8 cm. She has had biopsy of this lesion done in [place] where she had this workup and is waiting for results of the biopsy. She has been told that it is unlikely that this renal lesion has anything to do with her left back pain. Patient had been given a prescription for Bentyl earlier this year for her periodic episodes of diarrhea but says  that she's not been taking the medicine regularly but waits until she has an episode of diarrhea and then take stat and Imodium with some relief.   Review of Systems  Constitutional: Negative.   HENT: Negative.   Eyes: Negative.   Respiratory: Negative.   Gastrointestinal: Positive for abdominal pain and diarrhea.  Endocrine: Negative.   Genitourinary: Negative.   Musculoskeletal: Positive for back pain.  Allergic/Immunologic: Negative.   Neurological: Negative.   Hematological: Negative.   Psychiatric/Behavioral: Negative.    Outpatient Prescriptions Prior to Visit  Medication Sig Dispense Refill  . albuterol (PROVENTIL HFA;VENTOLIN HFA) 108 (90 BASE) MCG/ACT inhaler Inhale 2 puffs into the lungs every 4 (four) hours as needed.      Marland Kitchen amLODipine (NORVASC) 10 MG tablet Take 1 tablet by mouth daily.      Marland Kitchen BENICAR 40 MG tablet Take 40 mg by mouth daily.       . clopidogrel (PLAVIX) 75 MG tablet       . furosemide (LASIX) 40 MG tablet Take 1 tablet by mouth daily.      Marland Kitchen glucose blood (ONE TOUCH ULTRA TEST) test strip USE tID  300 each  3  . insulin aspart (NOVOLOG FLEXPEN) 100 UNIT/ML SOPN FlexPen Inject 5 units under the skin 3 times a day before meals daily, plus maintain sliding scale.  15 mL  2  . insulin glargine (LANTUS) 100 UNIT/ML injection Inject 0.2 mLs (20 Units total) into the skin 2 (two)  times daily.  10 mL  11  . Insulin Pen Needle (PEN NEEDLES 31GX5/16") 31G X 8 MM MISC Use bid  200 each  3  . ipratropium (ATROVENT) 0.03 % nasal spray 1-2 puffs each nostril up to every 8 hours if needed  30 mL  12  . labetalol (NORMODYNE) 200 MG tablet Take 1 tablet by mouth 2 (two) times daily.      . pantoprazole (PROTONIX) 40 MG tablet Take 1 tablet (40 mg total) by mouth daily.  90 tablet  3  . PLAVIX 75 MG tablet TAKE 1 TABLET BY MOUTH ONCE A DAY  90 tablet  2  . pregabalin (LYRICA) 50 MG capsule Take 1 capsule (50 mg total) by mouth as needed.  30 capsule  8  . zolpidem (AMBIEN)  5 MG tablet TAKE 1 TABLET BY MOUTH AT BEDTIME AS NEEDED  30 tablet  2  . dicyclomine (BENTYL) 10 MG capsule Take 10 mg po BID for diarrhea, urgency  60 capsule  1  . Fluticasone-Salmeterol (ADVAIR DISKUS) 100-50 MCG/DOSE AEPB Inhale 1 puff into the lungs 2 (two) times daily.  60 each  11  . CRESTOR 10 MG tablet TAKE 1 TABLET (10 MG TOTAL) BY MOUTH DAILY.  90 tablet  2  . HYDROcodone-acetaminophen (NORCO/VICODIN) 5-325 MG per tablet Take 1 tablet by mouth every 6 (six) hours as needed for pain.  65 tablet  3  . MOVIPREP 100 G SOLR       . sitaGLIPtin (JANUVIA) 100 MG tablet Take 100 mg by mouth daily.         No facility-administered medications prior to visit.   Allergies  Allergen Reactions  . Crestor [Rosuvastatin]     myopathy  . Ivp Dye [Iodinated Diagnostic Agents] Other (See Comments)    Nausea and SOB per patient  . Pioglitazone     REACTION: weight gain  . Sulfonamide Derivatives     REACTION: bLISTERS   Patient Active Problem List   Diagnosis Date Noted  . Knee pain, acute 07/12/2012  . Pain in joint, shoulder region 07/12/2012  . Myalgia and myositis 06/16/2012  . Unspecified vitamin D deficiency 06/16/2012  . Fatty liver disease, nonalcoholic [id number]  . Family hx of colon cancer 06/14/2012  . Hx of adenomatous colonic polyps 06/14/2012  . Neck pain on right side 05/03/2012  . Coronary atherosclerosis of native coronary artery 03/17/2012  . Edema 03/17/2012  . Other screening mammogram 11/05/2010  . 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 UTI 12/24/2009  . HYPERLIPIDEMIA 10/30/2009  . Chronic renal insufficiency, stage III (moderate) 10/30/2009  . GERD 08/20/2008  . ESOPHAGEAL STRICTURE 03/29/2008  . Seasonal and perennial allergic rhinitis 01/18/2007  . Type II or unspecified type diabetes mellitus with renal manifestations, uncontrolled(250.42) 08/06/2006  . GOUT 08/06/2006  . Hypertension, renal disease 08/06/2006  . Asthma with bronchitis  08/06/2006  . CEREBROVASCULAR ACCIDENT, HX OF 08/06/2006   History  Substance Use Topics  . Smoking status: Former Smoker -- 1.00 packs/day for 30 years    Types: Cigarettes    Quit date: 01/18/2002  . Smokeless tobacco: Never Used  . Alcohol Use: No   family history includes Colon cancer in an other family member; Colon polyps in her father and another family member; Diabetes in an other family member; GER disease in her father; Heart failure in her father; Ovarian cancer in her mother. There is no history of CAD.     Objective:   Physical  Exam  Well-developed older Afro-American female in no acute distress, pleasant blood pressure 102/50 pulse 64 height 5 foot 3 weight 214 M.D. By her husband. HEENT; nontraumatic normocephalic EOMI PERRLA sclera anicteric, Supple; no JVD, Cardiovascular; regular rate and rhythm with S1-S2 no murmur or gallop, Pulmonary ;clear bilaterally, Abdomen; large soft basically nontender nondistended bowel sounds are active she does have small umbilical hernia which is reducible no palpable mass or hepatosplenomegaly, Rectal; exam not done extremities no clubbing cyanosis or edema skin warm and dry, Psych; mood and affect normal and appropriate        Assessment & Plan:  #72  72 year old African American female with intermittent episodes of urgency and diarrhea most consistent with IBS. She is unaware of any specific food triggers but whenever she gets these episodes there followed by cramping in her lower remedy his in her left lower back. #2 adenomatous colon polyps-last colonoscopy June 2014 #3 family history of colon cancer #4 chronic kidney disease #5 history of CVA #6 coronary artery disease #7 diabetes #8 new left renal lesion rule out renal cell CA biopsy results pending  Plan; I do not think that her left back pain is directly related to her colon however it sounds like she gets extremity cramping along with his left back pain which may represent a  back spasm whenever she has diarrhea. But think her diarrhea is and IBS-type functional diarrhea which is very sporadic. Stirred to take Bentyl 10 mg every morning on a rising to attempt to overt the episodes of diarrhea. She will also start a prediabetic or probiotic on a daily basis. Followup with Dr. Olevia Perches  as needed

## 2012-12-22 NOTE — Progress Notes (Signed)
Pre visit review using our clinic review tool, if applicable. No additional management support is needed unless otherwise documented below in the visit note. 

## 2012-12-22 NOTE — Assessment & Plan Note (Signed)
She is being evaluated in [place] for this She returns next week to get the results of a biopsy and to consider a partial nephrectomy

## 2012-12-22 NOTE — Assessment & Plan Note (Signed)
Her BP is well controlled 

## 2012-12-26 ENCOUNTER — Other Ambulatory Visit: Payer: Self-pay | Admitting: *Deleted

## 2012-12-26 DIAGNOSIS — C649 Malignant neoplasm of unspecified kidney, except renal pelvis: Secondary | ICD-10-CM | POA: Diagnosis not present

## 2013-01-01 DIAGNOSIS — M545 Low back pain, unspecified: Secondary | ICD-10-CM | POA: Diagnosis not present

## 2013-01-01 DIAGNOSIS — Z23 Encounter for immunization: Secondary | ICD-10-CM | POA: Diagnosis not present

## 2013-01-01 DIAGNOSIS — I10 Essential (primary) hypertension: Secondary | ICD-10-CM | POA: Diagnosis not present

## 2013-01-01 DIAGNOSIS — E119 Type 2 diabetes mellitus without complications: Secondary | ICD-10-CM | POA: Diagnosis not present

## 2013-01-01 DIAGNOSIS — J449 Chronic obstructive pulmonary disease, unspecified: Secondary | ICD-10-CM | POA: Diagnosis not present

## 2013-01-01 DIAGNOSIS — N289 Disorder of kidney and ureter, unspecified: Secondary | ICD-10-CM | POA: Diagnosis not present

## 2013-01-01 DIAGNOSIS — N183 Chronic kidney disease, stage 3 unspecified: Secondary | ICD-10-CM | POA: Diagnosis not present

## 2013-01-15 ENCOUNTER — Other Ambulatory Visit: Payer: Self-pay | Admitting: *Deleted

## 2013-01-25 ENCOUNTER — Other Ambulatory Visit: Payer: Self-pay | Admitting: Urology

## 2013-01-25 ENCOUNTER — Ambulatory Visit
Admission: RE | Admit: 2013-01-25 | Discharge: 2013-01-25 | Disposition: A | Discharge status: Home / Self Care | Payer: No Typology Code available for payment source | Source: Ambulatory Visit | Attending: Urology | Admitting: Urology

## 2013-01-25 ENCOUNTER — Ambulatory Visit (HOSPITAL_BASED_OUTPATIENT_CLINIC_OR_DEPARTMENT_OTHER)
Admission: RE | Admit: 2013-01-25 | Discharge: 2013-01-25 | Disposition: A | Discharge status: Home / Self Care | Payer: No Typology Code available for payment source | Source: Ambulatory Visit | Attending: Urology | Admitting: Urology

## 2013-01-25 ENCOUNTER — Ambulatory Visit
Admission: RE | Admit: 2013-01-25 | Discharge: 2013-01-25 | Disposition: A | Discharge status: Home / Self Care | Payer: No Typology Code available for payment source | Source: Ambulatory Visit

## 2013-01-25 DIAGNOSIS — K589 Irritable bowel syndrome without diarrhea: Secondary | ICD-10-CM | POA: Diagnosis not present

## 2013-01-25 DIAGNOSIS — M109 Gout, unspecified: Secondary | ICD-10-CM | POA: Diagnosis not present

## 2013-01-25 DIAGNOSIS — N183 Chronic kidney disease, stage 3 unspecified: Secondary | ICD-10-CM | POA: Diagnosis not present

## 2013-01-25 DIAGNOSIS — I10 Essential (primary) hypertension: Secondary | ICD-10-CM | POA: Diagnosis not present

## 2013-01-25 DIAGNOSIS — C649 Malignant neoplasm of unspecified kidney, except renal pelvis: Secondary | ICD-10-CM | POA: Diagnosis not present

## 2013-01-25 DIAGNOSIS — E119 Type 2 diabetes mellitus without complications: Secondary | ICD-10-CM | POA: Diagnosis not present

## 2013-01-30 ENCOUNTER — Other Ambulatory Visit: Payer: Self-pay | Admitting: Internal Medicine

## 2013-01-31 DIAGNOSIS — R319 Hematuria, unspecified: Secondary | ICD-10-CM | POA: Diagnosis not present

## 2013-01-31 DIAGNOSIS — Z01818 Encounter for other preprocedural examination: Secondary | ICD-10-CM | POA: Diagnosis not present

## 2013-01-31 DIAGNOSIS — M545 Low back pain, unspecified: Secondary | ICD-10-CM | POA: Diagnosis not present

## 2013-01-31 DIAGNOSIS — N183 Chronic kidney disease, stage 3 unspecified: Secondary | ICD-10-CM | POA: Diagnosis not present

## 2013-01-31 DIAGNOSIS — I10 Essential (primary) hypertension: Secondary | ICD-10-CM | POA: Diagnosis not present

## 2013-01-31 DIAGNOSIS — C649 Malignant neoplasm of unspecified kidney, except renal pelvis: Secondary | ICD-10-CM | POA: Diagnosis not present

## 2013-01-31 DIAGNOSIS — M109 Gout, unspecified: Secondary | ICD-10-CM | POA: Diagnosis not present

## 2013-01-31 DIAGNOSIS — E119 Type 2 diabetes mellitus without complications: Secondary | ICD-10-CM | POA: Diagnosis not present

## 2013-01-31 DIAGNOSIS — N390 Urinary tract infection, site not specified: Secondary | ICD-10-CM | POA: Diagnosis not present

## 2013-02-08 DIAGNOSIS — C649 Malignant neoplasm of unspecified kidney, except renal pelvis: Secondary | ICD-10-CM | POA: Insufficient documentation

## 2013-02-08 NOTE — H&P (Signed)
History and Physical reviewed; I have examined the patient and there are no pertinent changes.  Pt is clear to proceed with scheduled open Left Partial Nephrectomy due to left renal cancer.     Greig Right, Marjory Lies, MD   7:46 AM 02/08/2013

## 2013-02-09 DIAGNOSIS — D62 Acute posthemorrhagic anemia: Secondary | ICD-10-CM | POA: Insufficient documentation

## 2013-02-10 DIAGNOSIS — N189 Chronic kidney disease, unspecified: Secondary | ICD-10-CM | POA: Insufficient documentation

## 2013-02-10 DIAGNOSIS — I10 Essential (primary) hypertension: Secondary | ICD-10-CM | POA: Insufficient documentation

## 2013-02-10 DIAGNOSIS — N179 Acute kidney failure, unspecified: Secondary | ICD-10-CM | POA: Insufficient documentation

## 2013-02-14 NOTE — Progress Notes (Signed)
Subjective:   Daily Progress Note: 02/14/2013 1:49 PM    Active Hospital Problems    Diagnosis   . Asthma   . Asthma exacerbation   . DM (diabetes mellitus) (HCC)   . Benign hypertension   . CKD (chronic kidney disease) (HCC)   . AKI (acute kidney injury) (HCC)   . Postoperative anemia due to acute blood loss   . Renal malignant tumor [facility])        Interval History: Pt admitted 02/08/13 with renal cancer left now S/P Open Left Partial Nephrectomy, anemia S/P 1 unit PRBC, AKI on CKD, resolved constipation, SOB, POD #6.        Objective:     BP 129/66  Pulse 66  Temp(Src) 97.4 F (36.3 C)  Resp 18  Ht 5\' 3"  (1.6 m)  Wt 96.163 kg (212 lb)  BMI 37.56 kg/m2  SpO2 97%    Temp (24hrs), Avg:98 F (36.7 C), Min:97.4 F (36.3 C), Max:98.6 F (37 C)      Intake/Output Summary (Last 24 hours) at 02/14/13 1349  Last data filed at 02/14/13 0811   Gross per 24 hour   Intake   1590 ml   Output   3070 ml   Net  -1480 ml         Pt is alert and oriented, NAD, sitting up in chair. Pain 1/10. Voiding. JP LUQ draining serous fluid. + flatus and BM.  SOB with activity. Denies chest pain, calf pain, N&V. Lungs CTA.  CV RRR. Abd soft obese, non tender, active bowel sounds, LUQ to left flank incision staples dry and approximated. No LE edema, TED hose in place.     Data Review:     Recent Labs      02/14/13   0421  02/13/13   0910  02/13/13   0559  02/12/13   0451   WBC  8.6   --   10.5  10.6   RBC  3.88   --   2.94*  3.21*   HEMOGLOBIN  11.2*  9.1*  8.4*  9.3*   HCT  32.9*  27.1*  25.2*  28.1*   MCV  85   --   86  88   MCH  29   --   29  29   MCHC  34   --   33  33   RDW  15.0   --   14.9  15.0   PLATELET  264   --   226  199   MPV  10.6   --   10.6  11.5*     Recent Labs      02/14/13   0421  02/13/13   0559  02/12/13   1048  02/12/13   0451   POTASSIUM  4.8  5.1  5.4  5.4   CHLORIDE  101  99  97*  99   CO2  20  16*  15*  16*   ANIONGAP  17.7  18.1   --   7.5   CALCIUM  8.6  8.5  8.9  8.8   BUN  73*  70*  57*  55*   CREAT  2.8*   3.0*  3.4*  3.0*   GLUCOSE  163*  165*  144*  150*       Hepatic Function    Lab Results   Component Value Date/Time    ALBUMIN 2.8* 02/14/2013  4:21 AM    TOTPR 6.3  02/14/2013  4:21 AM    ALKPHOS 79 02/14/2013  4:21 AM    Lab Results   Component Value Date/Time    SGOTAST 19 02/14/2013  4:21 AM    SGPTALT 16 02/14/2013  4:21 AM    BILIT 0.5 02/14/2013  4:21 AM          Labs reviewed    CXR: 02/14/13  EKG:     Meds reviewed      General:  Stress Ulcer Protocol Active: yes  DVT Protocol Active: yes    Activity  Up ad lib.     Disposition and Family   Dr. Graciela Husbands by to see pt this morning, plan of care reviewed.   Pulmonary and Nephrology recommendations for discharge.  Anticipating discharge home today or tomorrow.    Greig Right, NP/John Graciela Husbands, MD     Patient doing well.   WIll likely d/c soon.    Laureen Ochs, MD

## 2013-03-22 ENCOUNTER — Ambulatory Visit: Payer: Medicare Other | Admitting: Internal Medicine

## 2013-03-28 DIAGNOSIS — C649 Malignant neoplasm of unspecified kidney, except renal pelvis: Secondary | ICD-10-CM | POA: Diagnosis not present

## 2013-03-29 DIAGNOSIS — N183 Chronic kidney disease, stage 3 unspecified: Secondary | ICD-10-CM | POA: Diagnosis not present

## 2013-03-29 DIAGNOSIS — I10 Essential (primary) hypertension: Secondary | ICD-10-CM | POA: Diagnosis not present

## 2013-03-29 DIAGNOSIS — D649 Anemia, unspecified: Secondary | ICD-10-CM | POA: Diagnosis not present

## 2013-03-29 DIAGNOSIS — J45909 Unspecified asthma, uncomplicated: Secondary | ICD-10-CM | POA: Diagnosis not present

## 2013-03-29 DIAGNOSIS — E119 Type 2 diabetes mellitus without complications: Secondary | ICD-10-CM | POA: Diagnosis not present

## 2013-03-29 DIAGNOSIS — C649 Malignant neoplasm of unspecified kidney, except renal pelvis: Secondary | ICD-10-CM | POA: Diagnosis not present

## 2013-03-31 DIAGNOSIS — N184 Chronic kidney disease, stage 4 (severe): Secondary | ICD-10-CM | POA: Diagnosis not present

## 2013-04-05 DIAGNOSIS — M109 Gout, unspecified: Secondary | ICD-10-CM | POA: Diagnosis not present

## 2013-04-05 DIAGNOSIS — R809 Proteinuria, unspecified: Secondary | ICD-10-CM | POA: Diagnosis not present

## 2013-04-05 DIAGNOSIS — N184 Chronic kidney disease, stage 4 (severe): Secondary | ICD-10-CM | POA: Diagnosis not present

## 2013-04-05 DIAGNOSIS — I129 Hypertensive chronic kidney disease with stage 1 through stage 4 chronic kidney disease, or unspecified chronic kidney disease: Secondary | ICD-10-CM | POA: Diagnosis not present

## 2013-04-21 DIAGNOSIS — D2120 Benign neoplasm of connective and other soft tissue of unspecified lower limb, including hip: Secondary | ICD-10-CM | POA: Diagnosis not present

## 2013-04-21 DIAGNOSIS — M6740 Ganglion, unspecified site: Secondary | ICD-10-CM | POA: Diagnosis not present

## 2013-04-21 DIAGNOSIS — M109 Gout, unspecified: Secondary | ICD-10-CM | POA: Diagnosis not present

## 2013-04-21 DIAGNOSIS — IMO0001 Reserved for inherently not codable concepts without codable children: Secondary | ICD-10-CM | POA: Diagnosis not present

## 2013-04-23 IMAGING — CR DG CHEST 2V
2 series · 2 of 2 positions shown · non-contrast
Comparison: None

CLINICAL DATA: Cough, shortness of breath, chest pain, history
smoking, asthma, chronic bronchitis, diabetes, hypertension

CHEST - 2 VIEW

[view not recorded (1 of 2)]
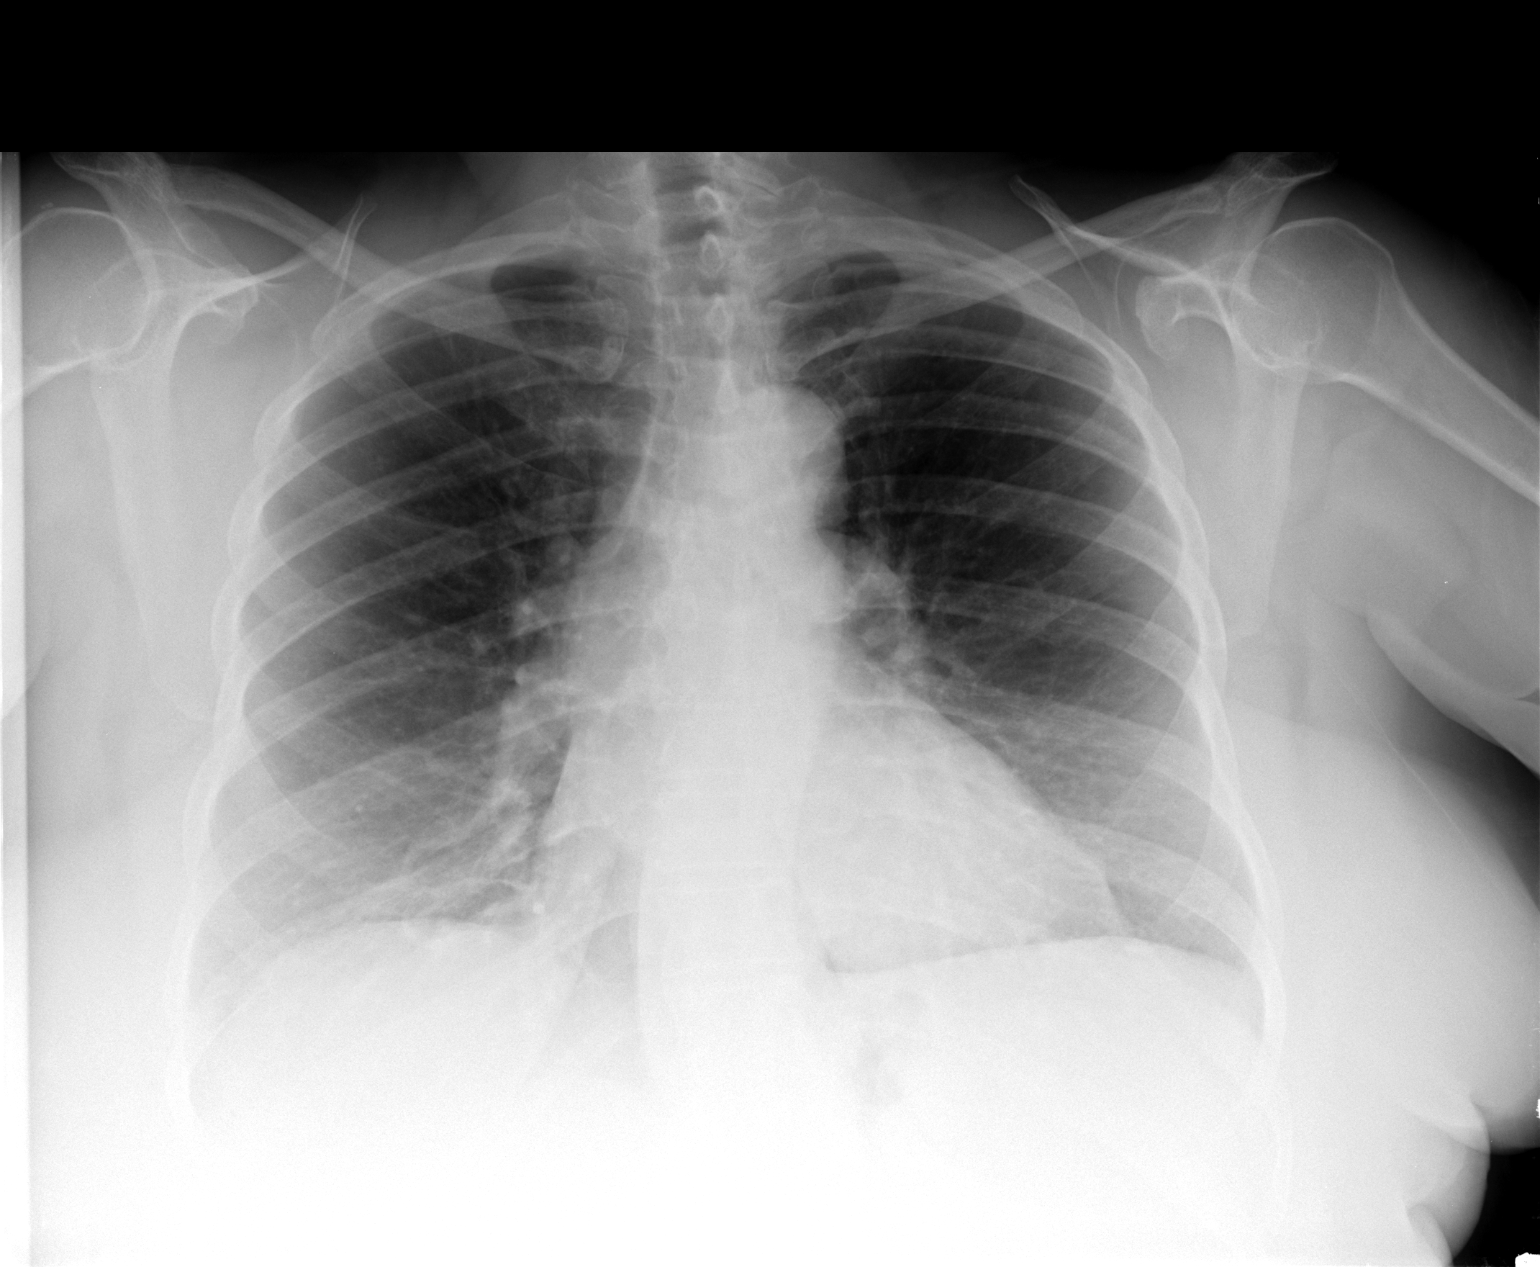

[view not recorded (2 of 2)]
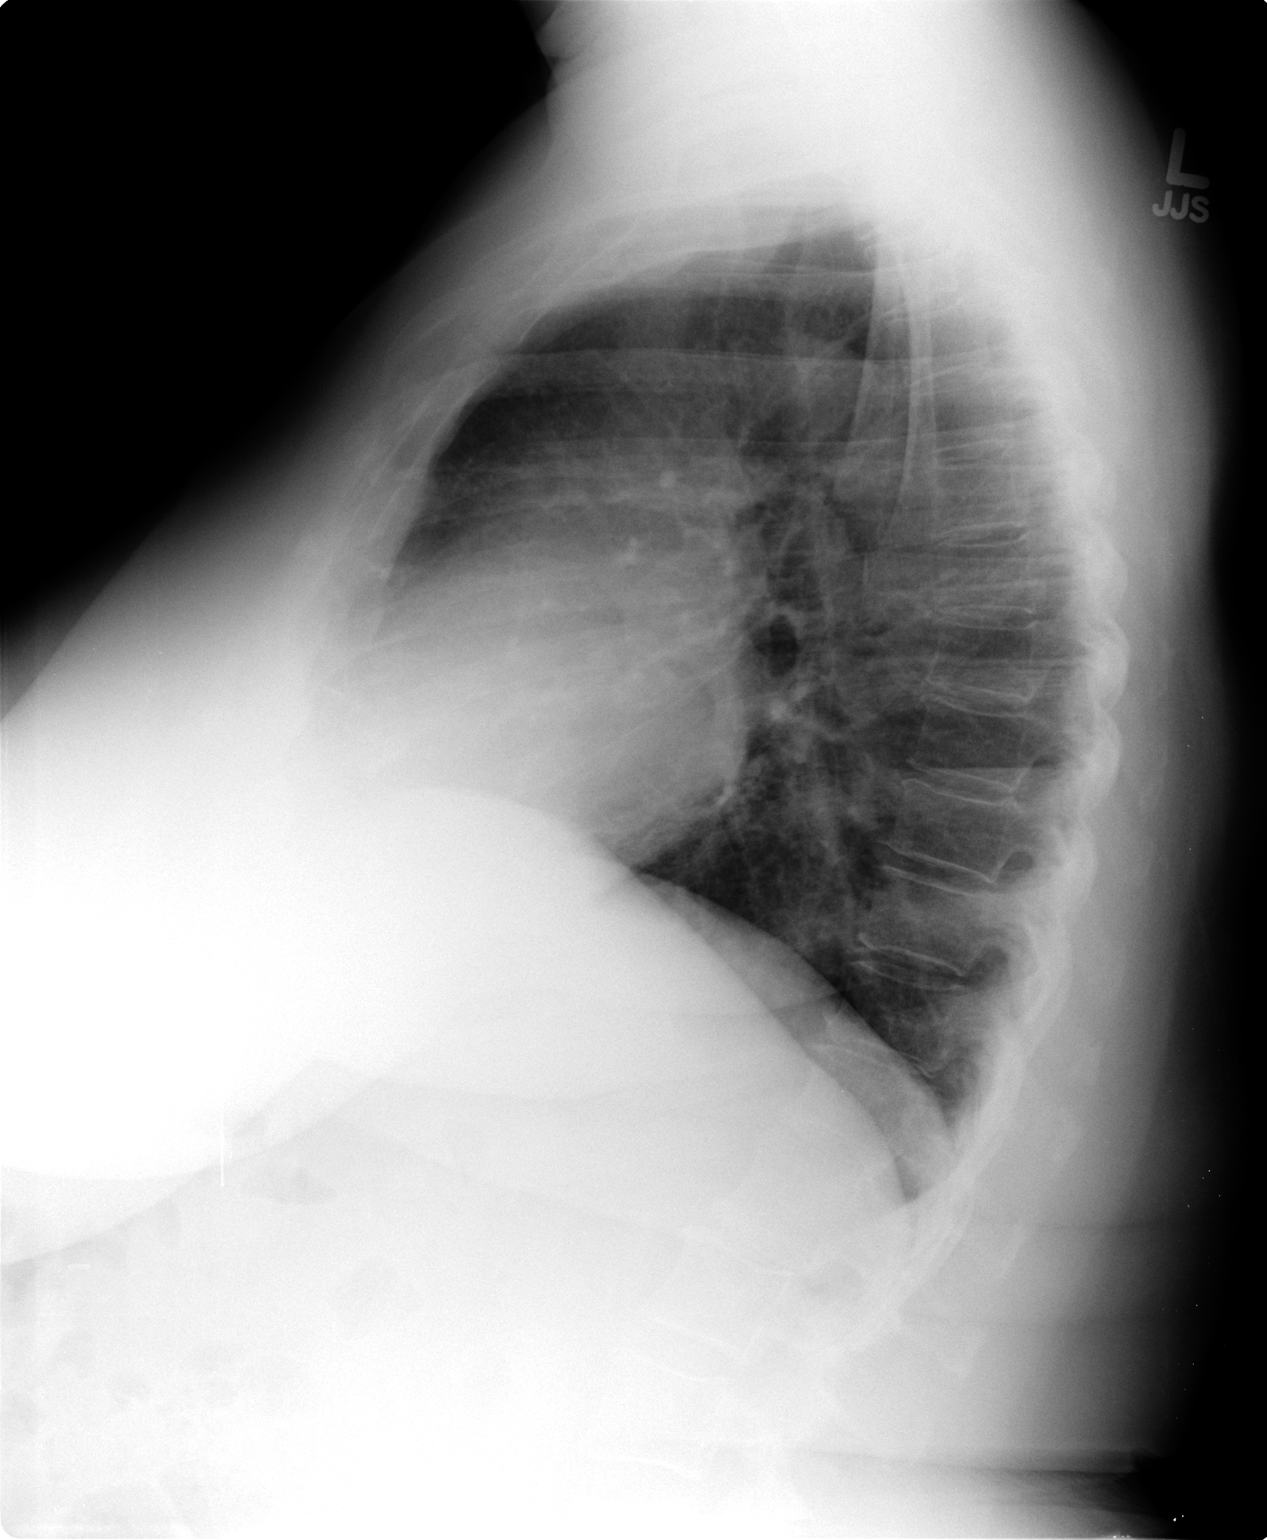

[2 of 2 positions shown; findings below may reference images not displayed]

FINDINGS: Upper-normal size of cardiac silhouette.
Mediastinal contours and pulmonary vascularity normal.
Mild right basilar atelectasis.
Lungs otherwise clear.
No pleural effusion or pneumothorax.
No acute osseous findings.
IMPRESSION: No acute abnormalities.

## 2013-04-28 DIAGNOSIS — N184 Chronic kidney disease, stage 4 (severe): Secondary | ICD-10-CM | POA: Diagnosis not present

## 2013-05-04 LAB — ECG 12-LEAD
Atrial Rate: 58 {beats}/min
P Axis: -5 degrees
P-R Interval: 158 ms
Q-T Interval: 438 ms
QRS Duration: 84 ms
QTC Calculation (Bezet): 429 ms
R Axis: 18 degrees
T Axis: 48 degrees
Ventricular Rate: 58 {beats}/min

## 2013-05-07 ENCOUNTER — Ambulatory Visit
Admission: RE | Admit: 2013-05-07 | Discharge: 2013-05-07 | Disposition: A | Discharge status: Home / Self Care | Payer: Medicare Other | Source: Ambulatory Visit | Attending: Nephrology | Admitting: Nephrology

## 2013-05-07 DIAGNOSIS — I10 Essential (primary) hypertension: Secondary | ICD-10-CM | POA: Diagnosis not present

## 2013-05-07 DIAGNOSIS — E119 Type 2 diabetes mellitus without complications: Secondary | ICD-10-CM | POA: Diagnosis not present

## 2013-05-07 DIAGNOSIS — I6340 Cerebral infarction due to embolism of unspecified cerebral artery: Secondary | ICD-10-CM | POA: Diagnosis not present

## 2013-05-07 DIAGNOSIS — E782 Mixed hyperlipidemia: Secondary | ICD-10-CM | POA: Diagnosis not present

## 2013-05-07 LAB — CBC
Hematocrit: 39.4 % (ref 37.0–47.0)
Hgb: 12.8 g/dL (ref 12.0–16.0)
MCH: 28.1 pg (ref 28.0–32.0)
MCHC: 32.5 g/dL (ref 32.0–36.0)
MCV: 86.6 fL (ref 80.0–100.0)
MPV: 11.4 fL (ref 9.4–12.3)
Nucleated RBC: 0 /100 WBC (ref 0–1)
Platelets: 221 10*3/uL (ref 140–400)
RBC: 4.55 10*6/uL (ref 4.20–5.40)
RDW: 15 % (ref 12–15)
WBC: 6.61 10*3/uL (ref 3.50–10.80)

## 2013-05-07 LAB — IRON PROFILE
Iron Saturation: 44 % (ref 15–50)
Iron: 133 ug/dL (ref 40–145)
TIBC: 304 ug/dL (ref 265–497)
UIBC: 171 ug/dL (ref 126–382)

## 2013-05-07 LAB — LIPID PANEL
Cholesterol / HDL Ratio: 5.7
Cholesterol: 356 mg/dL — ABNORMAL HIGH (ref 0–199)
HDL: 62 mg/dL (ref >40)
LDL Calculated: 258 mg/dL — ABNORMAL HIGH (ref 0–99)
Triglycerides: 182 mg/dL — ABNORMAL HIGH (ref 34–149)
VLDL Calculated: 36 mg/dL (ref 10–40)

## 2013-05-07 LAB — MICROALBUMIN, RANDOM URINE
Urine Creatinine, Random: 192.3
Urine Microalbumin, Random: 53 — ABNORMAL HIGH (ref 0.0–30.0)
Urine Microalbumin/Creatinine Ratio: 28 ug/mg — ABNORMAL LOW (ref 66–165)

## 2013-05-07 LAB — COMPREHENSIVE METABOLIC PANEL
ALT: 35 U/L (ref 0–55)
AST (SGOT): 26 U/L (ref 5–34)
Albumin/Globulin Ratio: 1.1 (ref 0.9–2.2)
Albumin: 3.4 g/dL — ABNORMAL LOW (ref 3.5–5.0)
Alkaline Phosphatase: 90 U/L (ref 37–106)
BUN: 30 mg/dL — ABNORMAL HIGH (ref 7.0–19.0)
Bilirubin, Total: 0.5 mg/dL (ref 0.1–1.2)
CO2: 24 mEq/L (ref 21–30)
Calcium: 9.4 mg/dL (ref 7.9–10.2)
Chloride: 106 mEq/L (ref 100–111)
Creatinine: 2.2 mg/dL — ABNORMAL HIGH (ref 0.4–1.5)
Globulin: 3.2 g/dL (ref 2.0–3.7)
Glucose: 138 mg/dL — ABNORMAL HIGH (ref 70–100)
Potassium: 4.2 mEq/L (ref 3.5–5.3)
Protein, Total: 6.6 g/dL (ref 6.0–8.3)
Sodium: 144 mEq/L (ref 135–146)

## 2013-05-07 LAB — GFR: EGFR: 26.4

## 2013-05-07 LAB — URIC ACID: Uric acid: 7 mg/dL — ABNORMAL HIGH (ref 2.4–6.0)

## 2013-05-07 LAB — HEMOLYSIS INDEX: Hemolysis Index: 4 (ref 0–18)

## 2013-05-07 LAB — PHOSPHORUS: Phosphorus: 3.8 mg/dL (ref 2.3–4.7)

## 2013-05-08 LAB — PTH INTACT: PTH Intact: 210 pg/mL — ABNORMAL HIGH (ref 14–64)

## 2013-05-09 ENCOUNTER — Inpatient Hospital Stay: Payer: Medicare Other | Admitting: Pulmonary Disease

## 2013-05-09 ENCOUNTER — Emergency Department: Payer: Medicare Other

## 2013-05-09 ENCOUNTER — Inpatient Hospital Stay
Admission: EM | Admit: 2013-05-09 | Discharge: 2013-05-14 | DRG: 537 | Disposition: A | Discharge status: Skilled Nursing Facility | Payer: Medicare Other | Attending: Pulmonary Disease | Admitting: Pulmonary Disease

## 2013-05-09 DIAGNOSIS — N289 Disorder of kidney and ureter, unspecified: Secondary | ICD-10-CM | POA: Diagnosis not present

## 2013-05-09 DIAGNOSIS — S79919A Unspecified injury of unspecified hip, initial encounter: Secondary | ICD-10-CM | POA: Diagnosis not present

## 2013-05-09 DIAGNOSIS — R262 Difficulty in walking, not elsewhere classified: Secondary | ICD-10-CM | POA: Diagnosis not present

## 2013-05-09 DIAGNOSIS — M79609 Pain in unspecified limb: Secondary | ICD-10-CM | POA: Diagnosis not present

## 2013-05-09 DIAGNOSIS — X58XXXA Exposure to other specified factors, initial encounter: Secondary | ICD-10-CM | POA: Diagnosis not present

## 2013-05-09 DIAGNOSIS — T1490XA Injury, unspecified, initial encounter: Secondary | ICD-10-CM | POA: Diagnosis not present

## 2013-05-09 DIAGNOSIS — IMO0002 Reserved for concepts with insufficient information to code with codable children: Secondary | ICD-10-CM | POA: Diagnosis not present

## 2013-05-09 DIAGNOSIS — M25559 Pain in unspecified hip: Secondary | ICD-10-CM | POA: Diagnosis not present

## 2013-05-09 DIAGNOSIS — M545 Low back pain, unspecified: Secondary | ICD-10-CM | POA: Diagnosis present

## 2013-05-09 DIAGNOSIS — I129 Hypertensive chronic kidney disease with stage 1 through stage 4 chronic kidney disease, or unspecified chronic kidney disease: Secondary | ICD-10-CM | POA: Diagnosis present

## 2013-05-09 DIAGNOSIS — Y92009 Unspecified place in unspecified non-institutional (private) residence as the place of occurrence of the external cause: Secondary | ICD-10-CM

## 2013-05-09 DIAGNOSIS — Z87891 Personal history of nicotine dependence: Secondary | ICD-10-CM

## 2013-05-09 DIAGNOSIS — S76309A Unspecified injury of muscle, fascia and tendon of the posterior muscle group at thigh level, unspecified thigh, initial encounter: Secondary | ICD-10-CM

## 2013-05-09 DIAGNOSIS — J4489 Other specified chronic obstructive pulmonary disease: Secondary | ICD-10-CM | POA: Diagnosis present

## 2013-05-09 DIAGNOSIS — Z8249 Family history of ischemic heart disease and other diseases of the circulatory system: Secondary | ICD-10-CM

## 2013-05-09 DIAGNOSIS — K219 Gastro-esophageal reflux disease without esophagitis: Secondary | ICD-10-CM | POA: Diagnosis present

## 2013-05-09 DIAGNOSIS — W010XXA Fall on same level from slipping, tripping and stumbling without subsequent striking against object, initial encounter: Secondary | ICD-10-CM | POA: Diagnosis present

## 2013-05-09 DIAGNOSIS — N058 Unspecified nephritic syndrome with other morphologic changes: Secondary | ICD-10-CM | POA: Diagnosis present

## 2013-05-09 DIAGNOSIS — E1149 Type 2 diabetes mellitus with other diabetic neurological complication: Secondary | ICD-10-CM | POA: Diagnosis present

## 2013-05-09 DIAGNOSIS — Z79899 Other long term (current) drug therapy: Secondary | ICD-10-CM

## 2013-05-09 DIAGNOSIS — Z833 Family history of diabetes mellitus: Secondary | ICD-10-CM

## 2013-05-09 DIAGNOSIS — Z794 Long term (current) use of insulin: Secondary | ICD-10-CM

## 2013-05-09 DIAGNOSIS — E1165 Type 2 diabetes mellitus with hyperglycemia: Secondary | ICD-10-CM | POA: Diagnosis present

## 2013-05-09 DIAGNOSIS — Z7902 Long term (current) use of antithrombotics/antiplatelets: Secondary | ICD-10-CM

## 2013-05-09 DIAGNOSIS — D72829 Elevated white blood cell count, unspecified: Secondary | ICD-10-CM | POA: Diagnosis present

## 2013-05-09 DIAGNOSIS — Z8673 Personal history of transient ischemic attack (TIA), and cerebral infarction without residual deficits: Secondary | ICD-10-CM

## 2013-05-09 DIAGNOSIS — E785 Hyperlipidemia, unspecified: Secondary | ICD-10-CM | POA: Diagnosis present

## 2013-05-09 DIAGNOSIS — N184 Chronic kidney disease, stage 4 (severe): Secondary | ICD-10-CM | POA: Diagnosis present

## 2013-05-09 DIAGNOSIS — Z905 Acquired absence of kidney: Secondary | ICD-10-CM

## 2013-05-09 DIAGNOSIS — E872 Acidosis, unspecified: Secondary | ICD-10-CM | POA: Diagnosis present

## 2013-05-09 DIAGNOSIS — J449 Chronic obstructive pulmonary disease, unspecified: Secondary | ICD-10-CM | POA: Diagnosis present

## 2013-05-09 DIAGNOSIS — Z85528 Personal history of other malignant neoplasm of kidney: Secondary | ICD-10-CM

## 2013-05-09 DIAGNOSIS — S76301A Unspecified injury of muscle, fascia and tendon of the posterior muscle group at thigh level, right thigh, initial encounter: Secondary | ICD-10-CM

## 2013-05-09 DIAGNOSIS — M109 Gout, unspecified: Secondary | ICD-10-CM | POA: Diagnosis present

## 2013-05-09 DIAGNOSIS — G8929 Other chronic pain: Secondary | ICD-10-CM | POA: Diagnosis present

## 2013-05-09 DIAGNOSIS — E1142 Type 2 diabetes mellitus with diabetic polyneuropathy: Secondary | ICD-10-CM | POA: Diagnosis present

## 2013-05-09 DIAGNOSIS — Z882 Allergy status to sulfonamides status: Secondary | ICD-10-CM

## 2013-05-09 HISTORY — DX: Malignant neoplasm of unspecified kidney, except renal pelvis: C64.9

## 2013-05-09 LAB — BASIC METABOLIC PANEL
Anion Gap: 15 (ref 5.0–15.0)
BUN: 30 mg/dL — ABNORMAL HIGH (ref 7.0–19.0)
CO2: 20 mEq/L — ABNORMAL LOW (ref 22–29)
Calcium: 10 mg/dL (ref 7.9–10.2)
Chloride: 107 mEq/L (ref 100–111)
Creatinine: 2 mg/dL — ABNORMAL HIGH (ref 0.6–1.0)
Glucose: 190 mg/dL — ABNORMAL HIGH (ref 70–100)
Potassium: 4.5 mEq/L (ref 3.5–5.1)
Sodium: 142 mEq/L (ref 136–145)

## 2013-05-09 LAB — CBC AND DIFFERENTIAL
Basophils Absolute Automated: 0.02 10*3/uL (ref 0.00–0.20)
Basophils Automated: 0 %
Eosinophils Absolute Automated: 0.08 10*3/uL (ref 0.00–0.70)
Eosinophils Automated: 1 %
Hematocrit: 38.6 % (ref 37.0–47.0)
Hgb: 12.6 g/dL (ref 12.0–16.0)
Immature Granulocytes Absolute: 0.03 10*3/uL
Immature Granulocytes: 0 %
Lymphocytes Absolute Automated: 2.25 10*3/uL (ref 0.50–4.40)
Lymphocytes Automated: 19 %
MCH: 28.2 pg (ref 28.0–32.0)
MCHC: 32.6 g/dL (ref 32.0–36.0)
MCV: 86.4 fL (ref 80.0–100.0)
MPV: 11.2 fL (ref 9.4–12.3)
Monocytes Absolute Automated: 0.5 10*3/uL (ref 0.00–1.20)
Monocytes: 4 %
Neutrophils Absolute: 8.84 10*3/uL — ABNORMAL HIGH (ref 1.80–8.10)
Neutrophils: 76 %
Nucleated RBC: 0 /100 WBC (ref 0–1)
Platelets: 220 10*3/uL (ref 140–400)
RBC: 4.47 10*6/uL (ref 4.20–5.40)
RDW: 15 % (ref 12–15)
WBC: 11.69 10*3/uL — ABNORMAL HIGH (ref 3.50–10.80)

## 2013-05-09 LAB — GFR: EGFR: 29.4

## 2013-05-09 LAB — HEMOLYSIS INDEX: Hemolysis Index: 7 (ref 0–18)

## 2013-05-09 LAB — GLUCOSE WHOLE BLOOD - POCT: Whole Blood Glucose POCT: 146 mg/dL — ABNORMAL HIGH (ref 70–100)

## 2013-05-09 MED ORDER — HYDROMORPHONE HCL PF 1 MG/ML IJ SOLN
0.5000 mg | Freq: Once | INTRAMUSCULAR | Status: AC
Start: 2013-05-09 — End: 2013-05-09
  Administered 2013-05-09: 0.5 mg via INTRAVENOUS

## 2013-05-09 MED ORDER — HYDROMORPHONE HCL PF 1 MG/ML IJ SOLN
0.5000 mg | Freq: Once | INTRAMUSCULAR | Status: AC
Start: 2013-05-09 — End: 2013-05-09
  Administered 2013-05-09: 0.5 mg via INTRAVENOUS
  Filled 2013-05-09: qty 1

## 2013-05-09 MED ORDER — ONDANSETRON HCL 4 MG/2ML IJ SOLN
4.0000 mg | Freq: Once | INTRAMUSCULAR | Status: AC
Start: 2013-05-09 — End: 2013-05-09
  Administered 2013-05-09: 4 mg via INTRAVENOUS

## 2013-05-09 MED ORDER — ONDANSETRON HCL 4 MG/2ML IJ SOLN
4.0000 mg | Freq: Once | INTRAMUSCULAR | Status: AC
Start: 2013-05-09 — End: 2013-05-10
  Filled 2013-05-09: qty 2

## 2013-05-09 NOTE — ED Provider Notes (Signed)
EMERGENCY DEPARTMENT HISTORY AND PHYSICAL EXAM    Date Time: 05/10/2013 1:27 AM  Patient Name: Shelby Barron  Attending Physician: Jethro Bastos, MD  Mid-Level: Toney Sang    History of Presenting Illness:   Shelby Barron is a 73 y.o. female   Chief Complaint: right leg pain  History obtained from: Patient.  Onset/Duration: pta  Quality: sharp  Severity:moderate  Aggravating Factors: rom  Alleviating Factors:  none  Associated Symptoms: none   Narrative/Additional Historical Findings: pt reports that she slipped in dog urine and did a splint.  She states that she pulled her right groin area and was unable to get up.  Denies head injury or neck pain.      PCP: Clarnce Flock, MD    Past Medical History:     Past Medical History   Diagnosis Date   . Hypertension    . CVA (cerebral infarction) Jan 12, 2003   . Hyperlipidemia    . Neuropathy of hand    . Diabetic nephropathy    . H/O: gout    . Acid reflux    . Asthma, well controlled    . SOB (shortness of breath)    . COPD (chronic obstructive pulmonary disease)    . Diabetes    . Fibroids    . Chronic lower back pain    . Gout    . Cancer of kidney        Past Surgical History:     Past Surgical History   Procedure Date   . Hernia repair    . Appendectomy    . Hysterectomy    . Tumor removed from kidney cJanuary 2015       Family History:     Family History   Problem Relation Age of Onset   . Cancer Mother    . Heart disease Father    . Cancer Maternal Aunt    . Diabetes Maternal Aunt    . Cancer Paternal Aunt        Social History:     History     Social History   . Marital Status: Married     Spouse Name: N/A     Number of Children: N/A   . Years of Education: N/A     Social History Main Topics   . Smoking status: Former Games developer   . Smokeless tobacco: Never Used   . Alcohol Use: No   . Drug Use: No   . Sexually Active: Not on file     Other Topics Concern   . Not on file     Social History Narrative   . No narrative on file       Allergies:      Allergies   Allergen Reactions   . Sulfa Antibiotics        Medications:     Patient's Medications   New Prescriptions    No medications on file   Previous Medications    AMLODIPINE (NORVASC) 10 MG TABLET    Take 10 mg by mouth daily.    CLOPIDOGREL (PLAVIX) 75 MG TABLET    Take 75 mg by mouth daily.      FEBUXOSTAT (ULORIC PO)    Take by mouth.    FLUTICASONE-SALMETEROL (ADVAIR HFA) 115-21 MCG/ACT INHALER    Inhale 2 puffs into the lungs as needed.    FUROSEMIDE (LASIX) 20 MG TABLET    Take 20 mg by mouth 2 (two) times  daily.      INSULIN ASPART (NOVOLOG) 100 UNIT/ML INJECTION    Inject 5-10 Units into the skin every evening.    INSULIN GLARGINE (LANTUS) 100 UNIT/ML INJECTION    Inject 20 Units into the skin every 12 (twelve) hours.    LABETALOL (NORMODYNE) 200 MG TABLET    Take 200 mg by mouth 2 (two) times daily.    OLMESARTAN (BENICAR) 40 MG TABLET    Take 40 mg by mouth daily.      PANTOPRAZOLE (PROTONIX) 40 MG TABLET    Take 40 mg by mouth daily.    PREGABALIN (LYRICA) 50 MG CAPSULE    Take 50 mg by mouth daily.    SITAGLIPTIN (JANUVIA) 100 MG TABLET    Take 100 mg by mouth daily.      ZOLPIDEM TARTRATE (AMBIEN PO)    Take by mouth.     Modified Medications    No medications on file   Discontinued Medications    No medications on file       Review of Systems:     Constitutional: No fever or chills.  Eyes: No discharge.   ENT: No ear pain or sore throat.  Respiratory: No cough or shortness of breath.  GI: No nausea, vomiting or diarrhea. No abdominal pain.  Musculoskeletal: No neck or back pain.  Skin:No rash or skin lesions.  Neurologic: No headache or dizziness.  Psychiatric: No substance abuse.    Physical Exam:     Filed Vitals:    05/10/13 0116   BP: 201/88   Pulse: 81   Temp: 97.5 F (36.4 C)   Resp: 18   SpO2: 93%       Constitutional: Vital signs reviewed.   Head: Normocephalic, atraumatic  Eyes: No conjunctival injection. No discharge.  ENT: Mucous membranes moist  Neck: Normal range of motion.  Non-tender.  Respiratory/Chest: Clear to auscultation. No respiratory distress.   Cardiovascular: Regular rate and rhythm. No murmur.   Abdomen: Soft and non-tender. No guarding. No masses or hepatosplenomegaly.  Genitourinary: deferred.  Back: No CVA tenderness. No midline tenderness.  UpperExtremity: Grossly normal ROM. 2+ radial pulses.  LowerExtremity: No edema. No cyanosis.  Right medial upper leg with ttp.  Painful rom at right hip.  Neurological: No focal motor deficits by observation. Speech normal. Memory normal.  Skin: Warm and dry. No rash.  Lymphatic: No cervical lymphadenopathy.  Psychiatric: Normal affect. Normal concentration.    Labs:     Results     Procedure Component Value Units Date/Time    Glucose Whole Blood - POCT [[id number]]  (Abnormal) Collected:05/10/13 0018     POCT - Glucose Whole blood 198 (H) mg/dL [id number]:14/78/29 5621    CBC and differential [[id number]]  (Abnormal) Collected:05/09/13 2315    Specimen Information:Blood / Blood Updated:05/09/13 2349     WBC 11.69 (H) x10 3/uL      RBC 4.47 x10 6/uL      Hgb 12.6 g/dL      Hematocrit 96.2 %      MCV 86.4 fL      MCH 28.2 pg      MCHC 32.6 g/dL      RDW 15 %      Platelets 220 x10 3/uL      MPV 11.2 fL      Neutrophils 76 %      Lymphocytes Automated 19 %      Monocytes 4 %      Eosinophils Automated 1 %  Basophils Automated 0 %      Immature Granulocyte 0 %      Nucleated RBC 0 /100 WBC      Neutrophils Absolute 8.84 (H) x10 3/uL      Abs Lymph Automated 2.25 x10 3/uL      Abs Mono Automated 0.50 x10 3/uL      Abs Eos Automated 0.08 x10 3/uL      Absolute Baso Automated 0.02 x10 3/uL      Absolute Immature Granulocyte 0.03 x10 3/uL     Basic Metabolic Panel (BMP) [[id number]]  (Abnormal) Collected:05/09/13 2315    Specimen Information:Blood Updated:05/09/13 2337     Glucose 190 (H) mg/dL      BUN 86.5 (H) mg/dL      Creatinine 2.0 (H) mg/dL      CALCIUM 78.4 mg/dL      Sodium 696 mEq/L      Potassium 4.5 mEq/L      Chloride 107 mEq/L       CO2 20 (L) mEq/L      Anion Gap 15.0     Hemolysis index [[id number]] Collected:05/09/13 2315     Hemolysis Index 7 Updated:05/09/13 2337    GFR [[id number]] Collected:05/09/13 2315     EGFR 29.4 Updated:05/09/13 2337    Glucose Whole Blood - POCT [[id number]]  (Abnormal) Collected:05/09/13 2151     POCT - Glucose Whole blood 146 (H) mg/dL [id number]:87/56/43 3295          Rads:     Radiology Results (24 Hour)     Procedure Component Value Units Date/Time    CT Lower Extremity Right WO Contrast [[id number]] Collected:05/10/13 0046    Order Status:Completed  Updated:05/10/13 0121    Narrative:    EXAM:    CT Right Lower Extremity Without Intravenous Contrast.    CLINICAL HISTORY:    73 years old, female; Right lower leg and hip pain.   Cannot walk.   Normal   xray    TECHNIQUE:    Axial computed tomography images of the right lower extremity without   intravenous contrast.    EXAM DATE/TIME:    Exam ordered 05/10/2013 12:46 AM.    COMPARISON:    DX - XR FEMUR RIGHT FRONT 05/09/2013 8:43:17 PM    FINDINGS:    Soft tissues:  Unremarkable.    Joints: Cystic structures in multiple bones of the foot likely reflect   degenerative disease.  Appearance at the distal aspect of the first metatarsal   suggests the possibility of gouty arthritis.    Bones:  See above.    Other findings:  No acute traumatic abnormality.      Impression:    IMPRESSION:       1. No acute traumatic abnormality.    2. cystic structures in multiple bones of the foot likely reflect degenerative   disease.  Appearance at the distal aspect of the first metatarsal suggests the   possibility of gouty arthritis.        Femur Right AP and Lateral [[id number]] Collected:05/09/13 2120    Order Status:Completed  Updated:05/09/13 2124    Narrative:    INDICATION: Pain following injury.    TECHNIQUE:  2 views of the right femur were obtained.    FINDINGS:  There is no evidence of fracture or dislocation.  No  significant soft tissue abnormality is identified.       Impression:      No evidence of fracture.    [employer],  MD   05/09/2013 9:20 PM      Pelvis AP W Lateral Hip Right [[id number]] Collected:05/09/13 2117    Order Status:Completed  Updated:05/09/13 2122    Narrative:    INDICATION:  Right hip pain     TECHNIQUE:  2 views.    FINDINGS: There is no fracture or destructive process. No significant  degenerative change or soft tissue abnormality is identified.      Impression:     No evidence of acute process.    Aquilla Hacker, MD   05/09/2013 9:18 PM            Procedures:       Assessment/Plan:   Pt unable to ambulate.  Attempts made numerous times with walker and pt broke out into a sweat secondary to pain.  D/w Dr. Butch Penny who will admit.    1. Right hamstring injury, initial encounter    2. Inability to ambulate due to right hip    3. Renal insufficiency        Signed by: Toney Sang, PA-C          Toney Sang, [place]  05/10/13 [phone]

## 2013-05-09 NOTE — ED Notes (Signed)
Report given to [facility].

## 2013-05-09 NOTE — ED Notes (Signed)
Patient slipped on dog pee and did a split resulting in pain to right thigh.  No shortening, denies hip pain.  Given 100 mcg of Fentanyl and 4 mg of Zofran IV by EMS.

## 2013-05-09 NOTE — ED Notes (Signed)
[id number]<AV> Expected date:<BR> Expected time:<BR> Means of arrival:<BR> Comments:<BR> Medic 422

## 2013-05-10 DIAGNOSIS — E872 Acidosis, unspecified: Secondary | ICD-10-CM | POA: Diagnosis not present

## 2013-05-10 DIAGNOSIS — I129 Hypertensive chronic kidney disease with stage 1 through stage 4 chronic kidney disease, or unspecified chronic kidney disease: Secondary | ICD-10-CM | POA: Diagnosis not present

## 2013-05-10 DIAGNOSIS — S76309A Unspecified injury of muscle, fascia and tendon of the posterior muscle group at thigh level, unspecified thigh, initial encounter: Secondary | ICD-10-CM | POA: Insufficient documentation

## 2013-05-10 DIAGNOSIS — IMO0002 Reserved for concepts with insufficient information to code with codable children: Secondary | ICD-10-CM | POA: Diagnosis not present

## 2013-05-10 DIAGNOSIS — I10 Essential (primary) hypertension: Secondary | ICD-10-CM | POA: Diagnosis not present

## 2013-05-10 DIAGNOSIS — N184 Chronic kidney disease, stage 4 (severe): Secondary | ICD-10-CM | POA: Diagnosis not present

## 2013-05-10 DIAGNOSIS — E119 Type 2 diabetes mellitus without complications: Secondary | ICD-10-CM | POA: Diagnosis not present

## 2013-05-10 LAB — GLUCOSE WHOLE BLOOD - POCT
Whole Blood Glucose POCT: 145 mg/dL — ABNORMAL HIGH (ref 70–100)
Whole Blood Glucose POCT: 156 mg/dL — ABNORMAL HIGH (ref 70–100)
Whole Blood Glucose POCT: 198 mg/dL — ABNORMAL HIGH (ref 70–100)
Whole Blood Glucose POCT: 204 mg/dL — ABNORMAL HIGH (ref 70–100)
Whole Blood Glucose POCT: 215 mg/dL — ABNORMAL HIGH (ref 70–100)
Whole Blood Glucose POCT: 227 mg/dL — ABNORMAL HIGH (ref 70–100)

## 2013-05-10 LAB — BASIC METABOLIC PANEL
Anion Gap: 12 (ref 5.0–15.0)
BUN: 35 mg/dL — ABNORMAL HIGH (ref 7.0–19.0)
CO2: 19 mEq/L — ABNORMAL LOW (ref 22–29)
Calcium: 9.4 mg/dL (ref 7.9–10.2)
Chloride: 106 mEq/L (ref 100–111)
Creatinine: 2.2 mg/dL — ABNORMAL HIGH (ref 0.6–1.0)
Glucose: 147 mg/dL — ABNORMAL HIGH (ref 70–100)
Potassium: 4.5 mEq/L (ref 3.5–5.1)
Sodium: 137 mEq/L (ref 136–145)

## 2013-05-10 LAB — URINALYSIS WITH MICROSCOPIC
Bilirubin, UA: NEGATIVE
Blood, UA: NEGATIVE
Glucose, UA: NEGATIVE
Ketones UA: NEGATIVE
Leukocyte Esterase, UA: NEGATIVE
Nitrite, UA: NEGATIVE
Protein, UR: NEGATIVE
Specific Gravity UA: 1.015 (ref 1.001–1.035)
Urine pH: 5 (ref 5.0–8.0)
Urobilinogen, UA: NEGATIVE mg/dL

## 2013-05-10 LAB — GFR: EGFR: 26.4

## 2013-05-10 LAB — HEMOLYSIS INDEX: Hemolysis Index: 14 (ref 0–18)

## 2013-05-10 MED ORDER — LISINOPRIL 5 MG PO TABS
5.0000 mg | ORAL_TABLET | Freq: Two times a day (BID) | ORAL | Status: DC
Start: 2013-05-10 — End: 2013-05-13
  Administered 2013-05-10 – 2013-05-12 (×4): 5 mg via ORAL
  Filled 2013-05-10 (×5): qty 1

## 2013-05-10 MED ORDER — INSULIN ASPART 100 UNIT/ML SC SOLN
1.0000 [IU] | Freq: Three times a day (TID) | SUBCUTANEOUS | Status: DC | PRN
Start: 2013-05-10 — End: 2013-05-14
  Administered 2013-05-10: 1 [IU] via SUBCUTANEOUS
  Administered 2013-05-10: 2 [IU] via SUBCUTANEOUS
  Administered 2013-05-11: 3 [IU] via SUBCUTANEOUS
  Administered 2013-05-12 (×2): 1 [IU] via SUBCUTANEOUS
  Administered 2013-05-12: 3 [IU] via SUBCUTANEOUS
  Administered 2013-05-12: 2 [IU] via SUBCUTANEOUS
  Administered 2013-05-13: 3 [IU] via SUBCUTANEOUS
  Administered 2013-05-13 (×2): 1 [IU] via SUBCUTANEOUS
  Filled 2013-05-10: qty 30
  Filled 2013-05-10 (×2): qty 20
  Filled 2013-05-10: qty 10
  Filled 2013-05-10: qty 30
  Filled 2013-05-10 (×2): qty 10
  Filled 2013-05-10: qty 30
  Filled 2013-05-10 (×2): qty 10

## 2013-05-10 MED ORDER — CLONIDINE HCL 0.1 MG PO TABS
0.2000 mg | ORAL_TABLET | Freq: Once | ORAL | Status: AC
Start: 2013-05-10 — End: 2013-05-10
  Administered 2013-05-10: 0.2 mg via ORAL
  Filled 2013-05-10: qty 2

## 2013-05-10 MED ORDER — ONDANSETRON HCL 4 MG/2ML IJ SOLN
4.0000 mg | INTRAMUSCULAR | Status: DC | PRN
Start: 2013-05-10 — End: 2013-05-14

## 2013-05-10 MED ORDER — ZOLPIDEM TARTRATE 5 MG PO TABS
5.0000 mg | ORAL_TABLET | Freq: Every evening | ORAL | Status: DC | PRN
Start: 2013-05-10 — End: 2013-05-14

## 2013-05-10 MED ORDER — ALBUTEROL SULFATE (2.5 MG/3ML) 0.083% IN NEBU
2.5000 mg | INHALATION_SOLUTION | RESPIRATORY_TRACT | Status: DC | PRN
Start: 2013-05-10 — End: 2013-05-14
  Administered 2013-05-10 – 2013-05-13 (×3): 2.5 mg via RESPIRATORY_TRACT
  Filled 2013-05-10 (×3): qty 3

## 2013-05-10 MED ORDER — LABETALOL HCL 200 MG PO TABS
200.0000 mg | ORAL_TABLET | Freq: Two times a day (BID) | ORAL | Status: DC
Start: 2013-05-10 — End: 2013-05-13
  Administered 2013-05-10 – 2013-05-12 (×6): 200 mg via ORAL
  Filled 2013-05-10 (×7): qty 1

## 2013-05-10 MED ORDER — HYDROMORPHONE HCL PF 1 MG/ML IJ SOLN
1.0000 mg | Freq: Once | INTRAMUSCULAR | Status: AC
Start: 2013-05-10 — End: 2013-05-10
  Administered 2013-05-10: 1 mg via INTRAVENOUS
  Filled 2013-05-10: qty 1

## 2013-05-10 MED ORDER — NALOXONE HCL 0.4 MG/ML IJ SOLN
0.2000 mg | INTRAMUSCULAR | Status: DC | PRN
Start: 2013-05-10 — End: 2013-05-14

## 2013-05-10 MED ORDER — GLUCOSE 40 % PO GEL
15.0000 g | ORAL | Status: DC | PRN
Start: 2013-05-10 — End: 2013-05-14

## 2013-05-10 MED ORDER — GLUCAGON HCL (RDNA) 1 MG IJ SOLR
1.0000 mg | INTRAMUSCULAR | Status: DC | PRN
Start: 2013-05-10 — End: 2013-05-14

## 2013-05-10 MED ORDER — CLOPIDOGREL BISULFATE 75 MG PO TABS
75.0000 mg | ORAL_TABLET | Freq: Once | ORAL | Status: DC
Start: 2013-05-10 — End: 2013-05-10

## 2013-05-10 MED ORDER — SODIUM CHLORIDE 0.9 % IV SOLN
INTRAVENOUS | Status: DC
Start: 2013-05-10 — End: 2013-05-11

## 2013-05-10 MED ORDER — AMLODIPINE BESYLATE 5 MG PO TABS
10.0000 mg | ORAL_TABLET | Freq: Every day | ORAL | Status: DC
Start: 2013-05-10 — End: 2013-05-14
  Administered 2013-05-10 – 2013-05-14 (×5): 10 mg via ORAL
  Filled 2013-05-10 (×5): qty 2

## 2013-05-10 MED ORDER — ENOXAPARIN SODIUM 30 MG/0.3ML SC SOLN
30.0000 mg | Freq: Every day | SUBCUTANEOUS | Status: DC
Start: 2013-05-10 — End: 2013-05-14
  Administered 2013-05-10 – 2013-05-14 (×5): 30 mg via SUBCUTANEOUS
  Filled 2013-05-10 (×5): qty 0.3

## 2013-05-10 MED ORDER — ONDANSETRON HCL 4 MG/2ML IJ SOLN
INTRAMUSCULAR | Status: AC
Start: 2013-05-10 — End: 2013-05-10
  Administered 2013-05-10: 4 mg via INTRAVENOUS
  Filled 2013-05-10: qty 2

## 2013-05-10 MED ORDER — DEXTROSE 50 % IV SOLN
25.0000 mL | INTRAVENOUS | Status: DC | PRN
Start: 2013-05-10 — End: 2013-05-14

## 2013-05-10 MED ORDER — FUROSEMIDE 20 MG PO TABS
20.0000 mg | ORAL_TABLET | Freq: Two times a day (BID) | ORAL | Status: DC
Start: 2013-05-10 — End: 2013-05-14
  Administered 2013-05-10 – 2013-05-14 (×9): 20 mg via ORAL
  Filled 2013-05-10 (×9): qty 1

## 2013-05-10 MED ORDER — PREGABALIN 50 MG PO CAPS
50.0000 mg | ORAL_CAPSULE | Freq: Every day | ORAL | Status: DC
Start: 2013-05-10 — End: 2013-05-14
  Administered 2013-05-10 – 2013-05-14 (×5): 50 mg via ORAL
  Filled 2013-05-10 (×5): qty 1

## 2013-05-10 MED ORDER — HYDROMORPHONE HCL PF 1 MG/ML IJ SOLN
1.0000 mg | INTRAMUSCULAR | Status: DC | PRN
Start: 2013-05-10 — End: 2013-05-14
  Administered 2013-05-10 – 2013-05-13 (×9): 1 mg via INTRAVENOUS
  Filled 2013-05-10 (×9): qty 1

## 2013-05-10 MED ORDER — CLOPIDOGREL BISULFATE 75 MG PO TABS
75.0000 mg | ORAL_TABLET | Freq: Every day | ORAL | Status: DC
Start: 2013-05-10 — End: 2013-05-14
  Administered 2013-05-10 – 2013-05-14 (×5): 75 mg via ORAL
  Filled 2013-05-10 (×5): qty 1

## 2013-05-10 MED ORDER — FUROSEMIDE 20 MG PO TABS
20.0000 mg | ORAL_TABLET | Freq: Every day | ORAL | Status: DC
Start: 2013-05-10 — End: 2013-05-10
  Filled 2013-05-10: qty 1

## 2013-05-10 MED ORDER — INSULIN GLARGINE 100 UNIT/ML SC SOLN
20.0000 [IU] | Freq: Two times a day (BID) | SUBCUTANEOUS | Status: DC
Start: 2013-05-10 — End: 2013-05-14
  Administered 2013-05-10 – 2013-05-14 (×9): 20 [IU] via SUBCUTANEOUS
  Filled 2013-05-10 (×9): qty 20

## 2013-05-10 MED ORDER — SODIUM BICARBONATE 650 MG PO TABS
650.0000 mg | ORAL_TABLET | Freq: Two times a day (BID) | ORAL | Status: DC
Start: 2013-05-10 — End: 2013-05-14
  Administered 2013-05-10 – 2013-05-13 (×7): 650 mg via ORAL
  Filled 2013-05-10 (×7): qty 1

## 2013-05-10 MED ORDER — AMLODIPINE BESYLATE 5 MG PO TABS
10.0000 mg | ORAL_TABLET | Freq: Every day | ORAL | Status: DC
Start: 2013-05-10 — End: 2013-05-10

## 2013-05-10 MED ORDER — PANTOPRAZOLE SODIUM 40 MG PO TBEC
40.0000 mg | DELAYED_RELEASE_TABLET | Freq: Every morning | ORAL | Status: DC
Start: 2013-05-11 — End: 2013-05-14
  Administered 2013-05-11 – 2013-05-14 (×4): 40 mg via ORAL
  Filled 2013-05-10 (×4): qty 1

## 2013-05-10 MED ORDER — LABETALOL HCL 200 MG PO TABS
200.0000 mg | ORAL_TABLET | Freq: Two times a day (BID) | ORAL | Status: DC
Start: 2013-05-10 — End: 2013-05-10

## 2013-05-10 MED ORDER — FLUTICASONE-SALMETEROL 115-21 MCG/ACT IN AERO
2.0000 | INHALATION_SPRAY | Freq: Two times a day (BID) | RESPIRATORY_TRACT | Status: DC
Start: 2013-05-10 — End: 2013-05-14
  Administered 2013-05-10 – 2013-05-14 (×7): 2 via RESPIRATORY_TRACT
  Filled 2013-05-10: qty 8

## 2013-05-10 NOTE — H&P (Signed)
Patient Type: V     ATTENDING PHYSICIAN: Jackquline Berlin, MD     HISTORY OF PRESENT ILLNESS:  This is a 73 year old female with a past medical history significant for  hypertension, cerebrovascular accident, hyperlipidemia, chronic neuropathy  related to diabetes mellitus, gout, gastroesophageal reflux disease,  bronchial asthma, chronic back pain and renal cell cancer, status post  partial left nephrectomy, who presented to the emergency room at [facility] with the chief complaint of right leg pain.   Apparently, the patient had surgery to her left kidney about 3 weeks ago.   She went home and has been doing fairly well.  She was trying to ambulate  around the house yesterday, when apparently her dog urinated on the floor  and she slipped and fell.  She developed severe right leg pain.  She was  not able to ambulate because of the pain.  EMS was called.  Brought into  the emergency room for evaluation.  Seen and examined.  She was somewhat  hypertensive; blood pressure up to 201/88.  X-ray done of her pelvis and  her femur showed no acute fracture.  She underwent a CT of the lower  extremity, which showed no evidence of acute process.  She remained with  severe right thigh pain in the posterior area and unable to control her  pain with oral medication and unable to ambulate; admitted for further  management.  No head injury.  No loss of consciousness.  No chest pain.  No  nausea, vomiting or diarrhea.     REVIEW OF SYSTEMS:  Otherwise unremarkable.     PAST MEDICAL HISTORY:  Again, history of renal cell carcinoma, status post partial left  nephrectomy 3 weeks ago, history of hypertension, cerebrovascular accident,  diabetes mellitus, hyperlipidemia, diabetic neuropathy, gout,  gastroesophageal reflux disease, bronchial asthma, along with chronic lower  back pain.     PAST SURGICAL HISTORY:  Hernia repair, appendectomy, hysterectomy, and partial left nephrectomy.     ALLERGIES:  SULFA.     FAMILY  HISTORY:  Cancer, heart disease and diabetes.     SOCIAL HISTORY:  Currently not a smoker.  No alcohol.  No drug use.     HOME MEDICATIONS:  Multiple medications, including Ambien, Lasix, Plavix, Lyrica, Protonix,  labetalol, Lantus, Advair, Uloric and amlodipine.     PHYSICAL EXAMINATION:  GENERAL:  Seen at bedside.  Awake and alert.  Pleasant, not in any  distress.  She is still complaining of severe right leg pain and limitation  of movement.  VITAL SIGNS:  Blood pressure 147/63, heart rate 70, respirations 18,  temperature 99, saturation 97%.  HEENT AND NECK:  Neck is supple.  No adenopathy.  No carotid bruit.  CARDIAC:  Regular rate and rhythm.  No gallop, no murmur.  LUNGS:  Clear to auscultation.  No rales or wheezing.  She is on room air,  saturation 97%.  ABDOMEN:  Soft, nontender.  Bowel sounds present.  No organomegaly.  She  does have a scar in the posterior part of the left flank, consistent with a  recent nephrectomy.  The wound healed nicely.  EXTREMITIES:  Lower extremities showing trace edema.  There is some  limitation of movement of the right leg.  Does not appear weak, but severe  tenderness on palpation in the posterior aspect of her right thigh and with  range of motion.     LABORATORY AND DIAGNOSTIC DATA:  WBC is 11; hematocrit 38; platelet  count 228; normal differential.  Her BUN  and creatinine are 30 and 2.0, respectively.  Sodium is 142, potassium 4.5,  chloride 107, bicarbonate is 20, glucose is 190.     ASSESSMENT AND PLAN:  1.  This is a 73 year old female with the above medical history, now status  post fall, severe right leg pain.  Negative for fracture.  Most likely a  muscle related injury.  I am not sure if she does have a torn ligament or  muscle versus only a simple muscle injury.  At this time, we will continue  pain control and ask physical therapy to assess her ability to ambulate and  with pain control.  In the meantime, orthopedic surgery consultation has  been obtained to  see if an MRI is needed.  No further intervention  otherwise is recommended.  Awaiting orthopedic input.  2.  Hypertension, accelerated:  May be related to pain.  Improving.  Resume  her home medication and treat her as needed.  3.  Chronic kidney disease:  Creatinine of 2.  Partial left nephrectomy for  renal cell carcinoma.  Will ask nephrology to see her while she is here at  the hospital.  4.  Diabetes mellitus:  Will resume on insulin and start her on sliding  scale and Accu-Chek.  Her home medication will be resumed.  Plan of care  discussed with the patient.           D:  05/10/2013 14:15 PM by Dr. Jordan Likes A. Eston Mould, MD [phone])  T:  05/10/2013 15:33 PM by       Everlean Cherry: [id number]) (Doc ID: [id number])

## 2013-05-10 NOTE — ED Provider Notes (Signed)
I spoke with the pt and examined her.  I discussed the case with the PA.  Vitals reviewed.  Pt with tenderness to right buttock and pain in right hamstring.  Pt able to bear weight, but having difficulty ambulating.    Jethro Bastos, MD  05/10/13 [phone]

## 2013-05-10 NOTE — Consults (Signed)
[facility]  CONSULT    Date Time: 05/10/2013 2:07 PM  Patient Name: Shelby Barron, Shelby Barron  Requesting Physician: [name], MD  Consulting Physician: Alla German, MD    Primary Care Physician: Clarnce Flock, MD    Reason for Consultation: CKD 4       History of Presenting Illness:   Shelby Barron is a 73 y.o. female who presented to the hospital with pain after sustaining a fall. She has a history of CKD stage 4 with a baseline creatinine of 2.0. She also has a history of renal cell cancer and underwent a partial nephrectomy in Jan 2015.    Yesterday, she was coming out of the grocery store when she slipped and fell. She was unable to rise and was brought to the ER where all scans were negative for a fracture. She was determined to have a ham string strain and was admitted.    Over the last 24 hours, pt has voided only twice and small amounts both times. She admits to not being able to drink or eat normally.    Past Medical History:     Past Medical History   Diagnosis Date   . Hypertension    . CVA (cerebral infarction) Jan 12, 2003   . Hyperlipidemia    . Neuropathy of hand    . Diabetic nephropathy    . H/O: gout    . Acid reflux    . Asthma, well controlled    . SOB (shortness of breath)    . COPD (chronic obstructive pulmonary disease)    . Diabetes    . Fibroids    . Chronic lower back pain    . Gout    . Cancer of kidney        Available old records reviewed, including:  labs    Past Surgical History:     Past Surgical History   Procedure Date   . Hernia repair    . Appendectomy    . Hysterectomy    . Tumor removed from kidney cJanuary 2015       Family History:     Family History   Problem Relation Age of Onset   . Cancer Mother    . Heart disease Father    . Cancer Maternal Aunt    . Diabetes Maternal Aunt    . Cancer Paternal Aunt        Social History:     History     Social History   . Marital Status: Married     Spouse Name: N/A     Number of Children: N/A   . Years of  Education: N/A     Social History Main Topics   . Smoking status: Former Games developer   . Smokeless tobacco: Never Used   . Alcohol Use: No   . Drug Use: No   . Sexually Active: Not on file     Other Topics Concern   . Not on file     Social History Narrative   . No narrative on file       Allergies:     Allergies   Allergen Reactions   . Sulfa Antibiotics Hives       Medications:     Prescriptions prior to admission   Medication Sig   . amLODIPine (NORVASC) 10 MG tablet Take 10 mg by mouth daily.   . clopidogrel (PLAVIX) 75 MG tablet Take 75 mg by mouth daily.     Marland Kitchen  Febuxostat (ULORIC PO) Take by mouth.   . fluticasone-salmeterol (ADVAIR HFA) 115-21 MCG/ACT inhaler Inhale 2 puffs into the lungs as needed.   . furosemide (LASIX) 20 MG tablet Take 20 mg by mouth 2 (two) times daily.     . insulin aspart (NOVOLOG) 100 UNIT/ML injection Inject 5-10 Units into the skin every evening.   . insulin glargine (LANTUS) 100 UNIT/ML injection Inject 20 Units into the skin every 12 (twelve) hours.   Marland Kitchen labetalol (NORMODYNE) 200 MG tablet Take 200 mg by mouth 2 (two) times daily.   Marland Kitchen olmesartan (BENICAR) 40 MG tablet Take 40 mg by mouth daily.     . pantoprazole (PROTONIX) 40 MG tablet Take 40 mg by mouth daily.   . pregabalin (LYRICA) 50 MG capsule Take 50 mg by mouth daily.   . sitaGLIPtin (JANUVIA) 100 MG tablet Take 100 mg by mouth daily.     . Zolpidem Tartrate (AMBIEN PO) Take by mouth.          Scheduled Meds: PRN Meds:         amLODIPine 10 mg Oral Daily   [COMPLETED] cloNIDine 0.2 mg Oral Once   clopidogrel 75 mg Oral Daily   enoxaparin 30 mg Subcutaneous Daily   fluticasone-salmeterol 2 puff Inhalation BID   furosemide 20 mg Oral BID   [COMPLETED] HYDROmorphone 0.5 mg Intravenous Once   [COMPLETED] HYDROmorphone 0.5 mg Intravenous Once   [COMPLETED] HYDROmorphone 0.5 mg Intravenous Once   [COMPLETED] HYDROmorphone 1 mg Intravenous Once   insulin glargine 20 Units Subcutaneous Q12H   labetalol 200 mg Oral Q12H SCH   [COMPLETED]  ondansetron 4 mg Intravenous Once   [COMPLETED] ondansetron 4 mg Intravenous Once   [START ON 05/11/2013] pantoprazole 40 mg Oral QAM AC   pregabalin 50 mg Oral Daily   [DISCONTINUED] amLODIPine 10 mg Oral Daily   [DISCONTINUED] clopidogrel 75 mg Oral Once   [DISCONTINUED] furosemide 20 mg Oral Daily   [DISCONTINUED] labetalol 200 mg Oral BID         Continuous Infusions:         dextrose 15 g of glucose PRN   dextrose 25 mL PRN   glucagon (rDNA) 1 mg PRN   HYDROmorphone 1 mg Q4H PRN   insulin aspart 1-5 Units TID AC PRN   naloxone 0.2 mg PRN   ondansetron 4 mg Q4H PRN   zolpidem 5 mg QHS PRN           Review of Systems:   All other systems were reviewed and are negative except pain    Physical Exam:     Filed Vitals:    05/10/13 0116 05/10/13 0212 05/10/13 0706 05/10/13 1110   BP: 201/88 179/81 131/63 147/63   Pulse: 81 73 70 72   Temp: 97.5 F (36.4 C) 96.8 F (36 C) 97.9 F (36.6 C) 99 F (37.2 C)   TempSrc: Oral Oral     Resp: 18 16 18 18    Height:       Weight:       SpO2: 93% 99% 98% 97%       Intake and Output Summary (Last 24 hours) at Date Time  No intake or output data in the 24 hours ending 05/10/13 1407    General: awake, alert, oriented x 3, no acute distress.  HEENT: sclera anicteric, oropharynx clear without lesions, mucous membranes moist  Neck: supple, no lymphadenopathy, no thyromegaly, no JVD, no carotid bruits  Cardiovascular: regular rate and rhythm,  no murmurs, rubs or gallops  Lungs: clear to auscultation bilaterally, without wheezing, rhonchi, or rales  Abdomen: soft, non-tender, non-distended, normoactive bowel sounds, no palpable masses, no hepatosplenomegaly, no rebound or guarding  Extremities: no clubbing, cyanosis, or edema  Neuro: A+O x 3, no gross motor/sensory deficits  Skin: no rashes or lesions noted  Other: No joint swelling      Labs:     Lab 05/09/13 2315 05/07/13 1225   WBC 11.69* 6.61   HGB 12.6 12.8   HCT 38.6 39.4   PLT 220 221       Lab 05/09/13 2315 05/07/13 1225   NA  142 144   K 4.5 4.2   CL 107 106   CO2 20* 24   BUN 30.0* 30.0*   CREAT 2.0* 2.2*   CA 10.0 9.4   ALB -- 3.4*   PHOS -- 3.8   MG -- --   GLU 190* 138*   EGFR 29.4 26.4       No results found for this basename: URINETYPE,COLORUA,CLARITYUA,SPECGRAVUA,URINEPH,LEUKOCYTESUR,NITRITEUA,PROTEINUA,GLUUA,KETONESUA,UROBILIUA,BILIUA,BLOODUA,RBCUA,WBCUA,URINEBACTERI,GRANCASTUA in the last 168 hours    Lab 05/07/13 1225   URINECREATIN --   PROTEINUA --   Leonard Downing --   MICROALBUMIN --   NAUR --   CREATUR 192.3   OSMOLUR --       Imaging personally reviewed, including: CT      Assessment:     Patient Active Problem List   Diagnosis   . Diabetes mellitus type II, uncontrolled   . Diabetes mellitus type II, uncontrolled   . Neuropathy of hand   . Left renal mass   . Hamstring injury       1. CKD stage 4: Currently at baseline  2. Acidosis 2/2 CKD  3. HTN: Uncontrolled - prob due to pain  4. Decreased UOP: ?retention due to pain meds   5. Leucocytosis: ?etiology    Recommendations:     1. Bladder scan  2. Repeat labs  3. IVF   4. Bicarb  5. Avoidance of NSAIDS  6. No Lasix for now  7. Lisinopril for BP  8. Panculture      Al Carlyon Prows, MD, thank you for this consultation.  We will follow the patient with you during this hospitalization.  Please contact me with any questions or issues.    Signed by: Alla German, MD  [facility]  [phone] (office)  [phone])    cc: [name], MD  Clarnce Flock, MD

## 2013-05-10 NOTE — Plan of Care (Signed)
Problem: Safety  Goal: Patient will be free from injury during hospitalization  Outcome: Progressing  Patient S/pa fall at home. Intentional hourly rounding made. Call light and personal belongings within reach. Patient encouraged to call for assistance. Assisted to [facility] with one assist using a walker. Gait steady.     Problem: Pain  Goal: Patient's pain/discomfort is manageable  Outcome: Progressing  Patient medicated once with dilaudid for complaint of pain to right upper thigh and sacrum. Relief reported. Patient moved better and faster after pain medication administered.    Comments:   Labs drawn and iv fluids started. Bladder scan was done and 275 cc residual. Lasix and blood pressure medications given. Blood pressure stable.

## 2013-05-10 NOTE — Consults (Signed)
Consult dictated.  Briefly: 73 yo female slipped on wet surface doing the splits and injuring right medial hamstring muscle.  Physical exam consistent with right mid medial hamstring strain, no gapping or defects.  May be WB as tolerated with no restrictions and activity as tolerated.  Thigh wrapped in ace bandage for support and comfort.  Heating pad.  I will follow while in house.Marland Kitchen

## 2013-05-10 NOTE — Plan of Care (Signed)
Problem: Moderate/High Fall Risk Score >/=15  Goal: Patient will remain free of falls  Outcome: Progressing  Pt safety has been maintained, safety precautions reinforced. Bed locked and in the lowest position with 2/4 bed rails up. Bed alarm set. Instructed pt to call for assistance before getting oob, pt verbalizes understanding. Call bell and personal belongings within reach. Room free of clutter. Bedside commode and walker at bedside. Fall level marked on white board. Will continue hourly rounding per unit protocol.      Problem: Pain  Goal: Patient's pain/discomfort is manageable  Outcome: Progressing  Pt denies need for pain medication because there is "only pain when I move". Availability of medications explained to pt, verbalizes understanding. Notified oncoming nurse to give zofran with dilaudid as she gets nausea.     Comments:   Pt admitted to room 2114 via stretcher without complication. Introduced myself and oriented pt to room, whiteboard and call bell. Personal belongings and call bell at bedside. VSS, pt AAOx4. Pt denies pain and nausea at this time, pain only comes when she moves her R leg. Updated information on whiteboard and will continue to monitor pt. Pt able to ambulate to bedside commode with a walker. PT/OT consult needed. Pt states she is not allergic to "sulfa antibiotics" and that she "doesn't know why it is in the computer".

## 2013-05-10 NOTE — Consults (Signed)
Service Date: 05/10/2013     Patient Type: V     CONSULTING PHYSICIAN: Scharlene Gloss MD     REFERRING PHYSICIAN: [name] MD     CHIEF COMPLAINT:  Right hamstring injury.     HISTORY OF PRESENT ILLNESS:  The patient is a 73 year old female who slipped on a wet surface on the  floor, causing her to do the splits yesterday.  She felt a pull and pain in  her mid hamstring region.  She was asymptomatic prior to this with no  previous significant hamstring injuries.  She had difficulty walking and  putting any weight on the leg, so she was brought to the emergency room  where she was evaluated and has been admitted with severe right pain and  inability to bear weight and walk on the leg.  I am now asked to consult on  her hamstring injury.     PHYSICAL EXAMINATION:  GENERAL:  On admission shows a well-developed, well-nourished female in no  acute distress, lying in bed.  PSYCHOLOGIC:  She is alert and oriented x3 and answers all questions  appropriately.  EXTREMITIES:  Physical examination of her right lower leg shows it to have  some fairly marked tenderness in the mid and distal hamstring muscle  region.  There are no palpable defects noted in the hamstring tendon.   There is no significant swelling or ecchymosis at this time.  She does have  pain with hamstring stretch.     LABORATORY DATA:  X-rays were reviewed of the femur and the hip and knee were essentially  normal without any fracture.     IMPRESSION:  Right medial hamstring strain and tear.     RECOMMENDATIONS:  She may be weightbearing as tolerated.  I will order physical therapy to  work with her for ambulation, although she has been doing some of this  already.  She was also wrapped in Ace bandage for comfort and I will order  a heating pad for her.  I will follow her while she is in the hospital.           D:  05/10/2013 23:02 PM by Dr. Scharlene Gloss, MD [phone])  T:  05/10/2013 23:24 PM by       Everlean Cherry: [id number]) (Doc ID: [id number])

## 2013-05-10 NOTE — Plan of Care (Signed)
Problem: Physical Therapy  Goal: Patient condition is improving per Physical Therapy Treatment Plan  Outcome: Progressing  Discharge Recommendation: Home with home health PT;Home with supervision       Treatment/Interventions: Exercise;Gait training;Functional transfer training;LE strengthening/ROM;Endurance training;Patient/family training;Equipment eval/education;Bed mobility;Compensatory technique education  PT Frequency: 4-5x/wk     Early/Progressive Mobility Protocol Level: Step 7: Ambulate in Hallway (with assistance)  (Please See Therapy Evaluation for device and assistance level needed)    Goals:   Goals  Goal Formulation: With patient  Time for Goal Acheivement: By time of discharge  Goals: Select goal  Pt Will Go Supine To Sit: Independently;Partly met  Pt Will Perform Sit to Stand: Independently;Partly met  Pt Will Ambulate: 51-100 feet;with standard walker;Independently;Partly met  Pt Will Go Up / Down Stairs: 3-5 stairs;With stand by assist;Not met

## 2013-05-10 NOTE — Plan of Care (Signed)
Problem: Occupational Therapy  Goal: Patient condition is improving per Occupational Therapy Treatment Plan  Outcome: Progressing  Discharge Recommendation: Home with supervision (HH f/u)   DME Recommended for Discharge: [facility]    OT Frequency Recommended: 2-3x/wk     Early/Progressive Mobility Protocol Level: Step 6: Ambulate in Room (with assistance)  (Please See Therapy Evaluation for device and assistance level needed)    Treatment Interventions: ADL retraining;Functional transfer training;UE strengthening/ROM;Endurance training;Patient/Family training;Equipment eval/education       Goal Formulation: Patient  Time For Goal Achievement: by time of discharge  ADL Goals  Patient will dress upper body: independently  Patient will dress lower body: modified independently;with AE  Mobility and Transfer Goals  Pt will perform functional transfers: modified independently  Other Goal: Pt will perform bed mobility I as prep for ADLs

## 2013-05-10 NOTE — PT Eval Note (Signed)
[facility]  Physical Therapy Evaluation and Treatment    Patient: Shelby Barron     MRN#: [id number]   Unit: [facility] MEDICAL ONCOLOGY RENAL  Bed: A2114/A2114-01    Time of Evaluation:  Time Calculation  PT Received On: 05/10/2013  Start Time: 1029  Stop Time: 1044  Time Calculation (min): 15    Time of Treatment:  Time Calculation  PT Received On: 05/10/2013  Start Time:  1044  Stop Time:  1025  Time Calculation (min): 41    Consult received for Shelby Barron for PT Evaluation and Treatment.  Patient's medical condition is appropriate for Physical therapy intervention at this time.    Activity Orders: Bedrest, RN cleared patient to participate in full PT evaluation    Precautions and Contraindications:   Precautions  Weight Bearing Status: no restrictions  Other Precautions: Falls    Medical Diagnosis: Renal insufficiency [593.9]  Inability to ambulate due to right hip [719.7]  Right hamstring injury, initial encounter [959.6]  Hamstring injury    History of Present Illness: Shelby Barron is a 73 y.o. female admitted on 05/09/2013 following a slip at home in dog urine. Pt reports she did a 'split' when she fell then had c/o RLE pain.    Patient Active Problem List   Diagnosis   . Diabetes mellitus type II, uncontrolled   . Diabetes mellitus type II, uncontrolled   . Neuropathy of hand   . Left renal mass   . Hamstring injury        Past Medical/Surgical History:  Past Medical History   Diagnosis Date   . Hypertension    . CVA (cerebral infarction) Jan 12, 2003   . Hyperlipidemia    . Neuropathy of hand    . Diabetic nephropathy    . H/O: gout    . Acid reflux    . Asthma, well controlled    . SOB (shortness of breath)    . COPD (chronic obstructive pulmonary disease)    . Diabetes    . Fibroids    . Chronic lower back pain    . Gout    . Cancer of kidney       Past Surgical History   Procedure Date   . Hernia repair    . Appendectomy    . Hysterectomy    . Tumor removed from kidney cJanuary 2015          X-Rays/Tests/Labs:  Lab Results   Component Value Date/Time    HGB 12.6 05/09/2013 11:15 PM    HCT 38.6 05/09/2013 11:15 PM    K 4.5 05/09/2013 11:15 PM    NA 142 05/09/2013 11:15 PM     X-ray pelvis: Negative    X-ray right femur: Negative    CT right lower extremity: 1. No evidence of acute process.   2. Chronic radiolucent abnormalities identified in the foot      Social History:  Prior Level of Function  Prior level of function: Independent with ADLs;Ambulates independently  Baseline Activity Level: Community ambulation  DME Currently at Home: Standard walker (issued in ER)    Home Living Arrangements  Living Arrangements: Spouse/significant other;Children  Type of Home: House  Home Layout: Multi-level;Stairs to enter with rails (add number in comment) ("a set of steps to enter")  Bathroom Shower/Tub: Pension scheme manager: Standard  DME Currently at Home: Standard walker (issued in ER)  Home Living - Notes / Comments: Pt lives in  [place] but currently staying at [employer] in [place]. Above info for daughter's home.    Subjective:"If I lay still it doesn't hurt. But when I move it's bad."  Patient is agreeable to participation in the therapy session. Nursing clears patient for therapy.   Patient Goal: "to go home today"  Pain Assessment  Pain Assessment: Numeric Scale (0-10)  Pain Score: 8-severe pain  POSS Score: Awake and Alert  Pain Location:  (Thigh)  Pain Orientation: Right;Posterior  Pain Descriptors: Sharp  Pain Frequency: Increases with movement;Intermittent (No pain at rest)  Effect of Pain on Daily Activities: severe  Pain Intervention(s): Repositioned;Ambulation/increased activity;Medication (See eMAR)  Multiple Pain Sites: No    Objective:  Observation of Patient/Vital Signs:    Patient received in bed with Intravenous (IV) in place.         Filed Vitals:    05/10/13    BP: 147/63   Pulse: 72   Temp: 99 F (37.2 C)   Resp: 18   SpO2: 97% Room air         Cognitive Status  and Neuro Exam:  Cognition  Arousal/Alertness: Appropriate responses to stimuli  Attention Span: Appears intact  Orientation Level: Oriented X4  Memory: Appears intact  Following Commands: Follows all commands and directions without difficulty  Safety Awareness: minimal verbal instruction  Insights: Fully aware of deficits  Problem Solving: Able to problem solve independently  Neuro Status  Behavior: attentive;calm;cooperative  Motor Planning: intact  Coordination: intact    Musculoskeletal Examination  Gross ROM  Neck/Trunk ROM: within functional limits  Right Lower Extremity ROM: within functional limits (+ pain with full knee extension active or passive)  Left Lower Extremity ROM: within functional limits  Gross Strength  Neck/Trunk Strength: 5/5  Right Lower Extremity Strength: within functional limits (knee flexion/extension not tested due to pain)  Left Lower Extremity Strength: within functional limits  Tone  Tone: within functional limits    Functional Mobility:  Functional Mobility  Rolling: Modified independent (Device) (bedrail)  Supine to Sit: Min assist to left  Scooting to EOB: Stand by assistance  Sit to Supine: Stand by assistance  Sit to Stand: Min assist  Stand to Sit: Min assist     Locomotion  Ambulation: Unable to assess (Comment);maximal assistance;with standard walker  Ambulation Distance (Feet): 0 Feet  Pattern:  (Patient unable to weight bear on RLE to advance LLE)  Stair Management: not attempted     Balance  Balance  Balance: needs focused assessment (with SW)  Standing - Static: Good  Standing - Dynamic: Good    Participation and Activity Tolerance  Participation and Endurance  Participation Effort: good  Endurance: Tolerates 10 - 20 min exercise with multiple rests    Educated the patient to role of physical therapy, plan of care, goals of therapy and safety with mobility and ADLs, home safety.   Patient left in bed with bed alarm in place and call bell within reach. RN at bedside and  notified of session outcome.     Assessment: Shelby Barron is a 73 y.o. female admitted 05/09/2013.    Pt presents with the following impairments: Assessment: Decreased LE strength;Decreased endurance/activity tolerance;Decreased safety/judgement during functional mobility;Decreased functional mobility;Gait impairment.    Pt would benefit from Physical Therapy to allow for maximal safe mobility, decreased risk for falls and improved activity tolerance. Will attempt BID treatment after patient has pain medication to allow for improved weightbearing on RLE.    Treatment: Therapeutic  activities: stand pivot transfer bed<->bedside commode with contact guard assist, verbal and visual cues for proper UE placement and safety. Pt able to stand and perform perineal hygiene in standing with stand by assist only--appeared to weight bear mostly on LLE while standing. Sit<->stand with min to contact guard assist from EOB with cues for proper body mechanics, UE placement and safety. Pt with increased c/o pain when right knee fully extended. Pt advised to avoid full extension during transfers but encouraged to gradually reach full knee extension when supine to prevent knee flexion contracture.      Plan: Treatment/Interventions: Exercise;Gait training;Functional transfer training;LE strengthening/ROM;Endurance training;Patient/family training;Equipment eval/education;Bed mobility;Compensatory technique education   PT Frequency: twice a day   Risks/Benefits/POC Discussed with Pt/Family: With patient     G codes: yes  Mobility, Current Status (Z6109): CK  Mobility, Goal Status (U0454): CI       Goals:   Goals  Goal Formulation: With patient  Time for Goal Acheivement: By time of discharge  Goals: Select goal  Pt Will Go Supine To Sit: Independently  Pt Will Perform Sit to Stand: Independently  Pt Will Ambulate: 51-100 feet;with standard walker;Independently  Pt Will Go Up / Down Stairs: 3-5 stairs;With stand by assist            Discharge Recommendation: Home with home health PT;Home with supervision    Venita Seng, PT, DPT  Spectralink [phone]  1:53 PM 05/10/2013

## 2013-05-10 NOTE — Consults (Signed)
Renal:    Notified by patient's husband of her admission.  She is followed by Dr. [name] for CKD with baseline creatinine of 2.0.  She is at baseline and actually had an appt with him today.  Let us know if renal consult is requested.    Threasa Alpha, MD, FASN  [facility]

## 2013-05-10 NOTE — OT Eval Note (Signed)
[facility]   Occupational Therapy Evaluation and Treatment    Patient: Shelby Barron     MRN#: [id number]   Unit: [facility] MEDICAL ONCOLOGY RENAL  Bed: A2114/A2114-01    Time of Evaluation:  Time Calculation  OT Received On: 05/10/2013  Start Time: 1506  Stop Time: 1524  Time Calculation (min): 18    Time of Treatment:  Time Calculation  OT Received On: 05/10/2013  Start Time: 1525  Stop Time: 1605  Time Calculation (min): 40    Consult received for Shelby Barron for OT Evaluation and Treatment.  Patient's medical condition is appropriate for Occupational therapy intervention at this time.    Activity Orders: bed rest, cleared from RN for OOB activity  Precautions and Contraindications:   Precautions  Weight Bearing Status: no restrictions  Other Precautions: Falls    Medical Diagnosis: Renal insufficiency [593.9]  Inability to ambulate due to right hip [719.7]  Right hamstring injury, initial encounter [959.6]  Hamstring injury    History of Present Illness: Shelby Barron is a 73 y.o. female admitted on 05/09/2013 following a slip at home in dog urine. Pt reports she did a 'split' when she fell then had c/o RLE pain.    Patient Active Problem List   Diagnosis   . Diabetes mellitus type II, uncontrolled   . Diabetes mellitus type II, uncontrolled   . Neuropathy of hand   . Left renal mass   . Hamstring injury        Past Medical/Surgical History:  Past Medical History   Diagnosis Date   . Hypertension    . CVA (cerebral infarction) Jan 12, 2003   . Hyperlipidemia    . Neuropathy of hand    . Diabetic nephropathy    . H/O: gout    . Acid reflux    . Asthma, well controlled    . SOB (shortness of breath)    . COPD (chronic obstructive pulmonary disease)    . Diabetes    . Fibroids    . Chronic lower back pain    . Gout    . Cancer of kidney       Past Surgical History   Procedure Date   . Hernia repair    . Appendectomy    . Hysterectomy    . Tumor removed from kidney cJanuary 2015          X-Rays/Tests/Labs:  Lab Results   Component Value Date/Time    HGB 12.6 05/09/2013 11:15 PM    HCT 38.6 05/09/2013 11:15 PM    K 4.5 05/09/2013 11:15 PM    NA 142 05/09/2013 11:15 PM     X-ray pelvis: Negative   X-ray right femur: Negative   CT right lower extremity: 1. No evidence of acute process.   2. 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 Above info for daughter's home.    Subjective:     Patient is agreeable to participation in the therapy session. Nursing clears patient for therapy.   Patient Goal: get well  Pain Assessment  Pain Assessment:  (C/o pain with activity, didn't rate)      Objective:      Observation of Patient/Vital Signs:vss  Patient received in bed with Intravenous (IV) in place.      Cognitive Status and Neuro Exam:  Cognition  Arousal/Alertness: Appropriate responses to stimuli  Attention Span: Appears intact  Orientation Level: Oriented X4  Memory: Appears intact  Following Commands: independent  Safety Awareness: independent  Insights: Fully aware of deficits  Problem Solving: Able to problem solve independently  Neuro Status  Behavior:  attentive;calm;cooperative  Motor Planning: intact  Coordination: intact  Hand Dominance: right handed  Safety Awareness: intact       Musculoskeletal Examination  Gross ROM  Neck/Trunk ROM: within functional limits  Right Upper Extremity ROM: within functional limits  Left Upper Extremity ROM: within functional limits  Gross Strength  Right Upper Extremity Strength: within functional limits  Left Upper Extremity Strength: within functional limits  Tone  Tone: within functional limits    Sensory/Oculomotor Examination  Sensory  Auditory: intact  Tactile - Light Touch: intact  Visual Acuity: intact         Activities of Daily Living  Self-care and Home Management  Eating: Setup;in bed  Grooming: stand by assistance;standing at sink;wash/dry hands  LE Dressing: maximal assistance;Don/doff R sock;Don/doff L sock  Toileting: independently  Functional Transfers:  (CG A with SW)    Functional Mobility:  Mobility and Transfers  Scooting to EOB: Stand by assistance  Supine to Sit: Stand by assistance (/ CG A, elevated HOB, rails)  Sit to Supine: Stand by assistance (with rails)  Sit to Stand:  (CG A)  Functional Mobility/Ambulation:  (CG A with SW)     Balance  Balance  Static Sitting Balance: independently  Dyanamic Sitting Balance: with supervision  Static Standing Balance: with supervision (with SW)  Dynamic Standing Balance:  (CG A with SW)    Participation and Activity Tolerance  Participation and Endurance  Participation Effort: excellent  Endurance: Tolerates < 10 min exercise, no significant change in vital signs      Educated the patient to role of occupational therapy, plan of care, goals of therapy and safety with mobility and ADLs, energy conservation techniques, home safety.    Patient left in bed with call bell within reach. RN notified of session outcome    Assessment: Shelby Barron is a 73 y.o. female admitted 05/09/2013 presenting with the following Impairments: Assessment: balance deficits;decreased  strength;decreased independence with ADLs;decreased independence with IADLs;decreased safety awareness  Pt would benefit from skilled Occupational Therapy to max I with functional mobility and self care.    Treatment:  Pt completed bed mobility, ambulated to and from bathroom, attempted to complete LB dressing. Increased pain with all activity limiting independence with ADLs. Will con't to benefit from skilled OT services to max I with ADLs and IADLs.     Rehabilitation Potential: Prognosis: Good    Plan: OT Frequency Recommended: 2-3x/wk   Treatment Interventions: ADL retraining;Functional transfer training;UE strengthening/ROM;Endurance training;Patient/Family training;Equipment eval/education     Risks/benefits/POC discussed    G codes: yes  Self Care, Current Status (U0454):  CJ   Self Care, Goal Status (Z6109): CI        Goals: Time For Goal Achievement: by time of discharge  ADL Goals  Patient will dress upper body: independently  Patient will dress lower body: modified independently;with AE  Mobility and Transfer Goals  Pt will perform functional transfers: modified independently  Other Goal: Pt will perform bed mobility I as prep for ADLs             DME Recommended for Discharge: [facility]  Discharge Recommendation: Home with supervision [facility] f/u)         Caryn Section, OTR/L #[id number]  05/10/2013 4:21 PM

## 2013-05-10 NOTE — PT Progress Note (Signed)
[facility]  Physical Therapy Treatment    Patient:  Shelby Barron MRN#:  [id number]  Unit:  [facility] [facility] MEDICAL ONCOLOGY RENAL Room/Bed:  A2114/A2114-01    Time of treatment: Time Calculation  PT Received On: 05/10/13  Start Time: 1145  Stop Time: 1210  Time Calculation (min): 25 min           Visit # 2     Precautions:   Precautions  Weight Bearing Status: no restrictions  Other Precautions: Falls    Updated X-Rays/Tests/Labs:  Lab Results   Component Value Date/Time    HGB 12.6 05/09/2013 11:15 PM    HCT 38.6 05/09/2013 11:15 PM    K 4.5 05/09/2013 11:15 PM    NA 142 05/09/2013 11:15 PM         Subjective: "I have no pain now that I had the pain medicine."  Patient Goal: "to go home today"    Pain Assessment  Pain Assessment: No/denies pain  Pain Score: 8-severe pain  POSS Score: Awake and Alert  Pain Location:  (Thigh)  Pain Orientation: Right;Posterior  Pain Descriptors: Sharp  Pain Frequency: Increases with movement;Intermittent (No pain at rest)  Effect of Pain on Daily Activities: severe  Pain Intervention(s): Repositioned;Ambulation/increased activity;Medication (See eMAR)  Multiple Pain Sites: No    Patient's medical condition is appropriate for Physical Therapy intervention at this time.  Patient is agreeable to participation in the therapy session. Nursing clears patient for therapy.    Objective:  Observation of Patient/Vital Signs:     Patient received in bed with Intravenous (IV) in place. Patient seen for BID treatment following administration of pain medication to allow for improved OOB activity tolerance.    Cognition  Arousal/Alertness: Appropriate responses to stimuli  Attention Span: Appears intact  Orientation Level: Oriented X4  Memory: Appears intact  Following Commands: Follows all commands and directions without difficulty  Safety Awareness: minimal verbal instruction  Insights: Fully aware of deficits  Problem Solving: Able to problem solve independently            Functional Mobility  Rolling: Modified independent (Device) (bedrail)  Supine to Sit: Modified independent (Device) (with HOB elevated)  Scooting to HOB: Independent  Scooting to EOB: Independent  Sit to Supine: Independent  Sit to Stand: Stand by assistance  Stand to Sit: Stand by assistance     Locomotion  Ambulation: stand by assistance;with standard walker  Ambulation Distance (Feet): 30 Feet  Pattern: R decreased stance time;decreased step length;Step to  Stair Management: not attempted (Due to pt c/o feeling nauseos ambulating on level surface)                      Educated the patient to role of physical therapy, plan of care, goals of therapy and safety with mobility and ADLs, home safety.  Patient left in bed with bed alarm in place and call bell within reach. RN notified of session outcome.      Assessment: Patient with significantly improved activity tolerance and pain control secondary to pain medication. She demonstrated a safe steady gait that was limited by onset of nausea. She would benefit from home PT upon D/C to allow for further mobility training and continued PT while admitted to allow for safe return home.        Plan: Treatment/Interventions: Exercise;Gait training;Functional transfer training;LE strengthening/ROM;Endurance training;Patient/family training;Equipment eval/education;Bed mobility;Compensatory technique education      PT Frequency: 4-5x/wk   Continue plan of  care.    Goals: Goals  Goal Formulation: With patient  Time for Goal Acheivement: By time of discharge  Goals: Select goal  Pt Will Go Supine To Sit: Independently;Partly met  Pt Will Perform Sit to Stand: Independently;Partly met  Pt Will Ambulate: 51-100 feet;with standard walker;Independently;Partly met  Pt Will Go Up / Down Stairs: 3-5 stairs;With stand by assist;Not met           Discharge Recommendation: Home with home health PT;Home with supervision    Coburn Knaus, PT, DPT  Spectralink [phone]  2:01 PM  05/10/2013

## 2013-05-11 DIAGNOSIS — M545 Low back pain, unspecified: Secondary | ICD-10-CM | POA: Diagnosis present

## 2013-05-11 DIAGNOSIS — Z85528 Personal history of other malignant neoplasm of kidney: Secondary | ICD-10-CM | POA: Diagnosis not present

## 2013-05-11 DIAGNOSIS — N184 Chronic kidney disease, stage 4 (severe): Secondary | ICD-10-CM | POA: Diagnosis not present

## 2013-05-11 DIAGNOSIS — I120 Hypertensive chronic kidney disease with stage 5 chronic kidney disease or end stage renal disease: Secondary | ICD-10-CM | POA: Diagnosis not present

## 2013-05-11 DIAGNOSIS — Q245 Malformation of coronary vessels: Secondary | ICD-10-CM | POA: Diagnosis not present

## 2013-05-11 DIAGNOSIS — Z79899 Other long term (current) drug therapy: Secondary | ICD-10-CM | POA: Diagnosis not present

## 2013-05-11 DIAGNOSIS — M6281 Muscle weakness (generalized): Secondary | ICD-10-CM | POA: Diagnosis not present

## 2013-05-11 DIAGNOSIS — M109 Gout, unspecified: Secondary | ICD-10-CM | POA: Diagnosis not present

## 2013-05-11 DIAGNOSIS — N058 Unspecified nephritic syndrome with other morphologic changes: Secondary | ICD-10-CM | POA: Diagnosis not present

## 2013-05-11 DIAGNOSIS — K219 Gastro-esophageal reflux disease without esophagitis: Secondary | ICD-10-CM | POA: Diagnosis present

## 2013-05-11 DIAGNOSIS — I129 Hypertensive chronic kidney disease with stage 1 through stage 4 chronic kidney disease, or unspecified chronic kidney disease: Secondary | ICD-10-CM | POA: Diagnosis not present

## 2013-05-11 DIAGNOSIS — E785 Hyperlipidemia, unspecified: Secondary | ICD-10-CM | POA: Diagnosis present

## 2013-05-11 DIAGNOSIS — E1149 Type 2 diabetes mellitus with other diabetic neurological complication: Secondary | ICD-10-CM | POA: Diagnosis present

## 2013-05-11 DIAGNOSIS — Z8249 Family history of ischemic heart disease and other diseases of the circulatory system: Secondary | ICD-10-CM | POA: Diagnosis not present

## 2013-05-11 DIAGNOSIS — E1165 Type 2 diabetes mellitus with hyperglycemia: Secondary | ICD-10-CM | POA: Diagnosis present

## 2013-05-11 DIAGNOSIS — E872 Acidosis, unspecified: Secondary | ICD-10-CM | POA: Diagnosis not present

## 2013-05-11 DIAGNOSIS — Z7902 Long term (current) use of antithrombotics/antiplatelets: Secondary | ICD-10-CM | POA: Diagnosis not present

## 2013-05-11 DIAGNOSIS — Z905 Acquired absence of kidney: Secondary | ICD-10-CM | POA: Diagnosis not present

## 2013-05-11 DIAGNOSIS — Z8673 Personal history of transient ischemic attack (TIA), and cerebral infarction without residual deficits: Secondary | ICD-10-CM | POA: Diagnosis not present

## 2013-05-11 DIAGNOSIS — N183 Chronic kidney disease, stage 3 unspecified: Secondary | ICD-10-CM | POA: Diagnosis not present

## 2013-05-11 DIAGNOSIS — D72829 Elevated white blood cell count, unspecified: Secondary | ICD-10-CM | POA: Diagnosis present

## 2013-05-11 DIAGNOSIS — I10 Essential (primary) hypertension: Secondary | ICD-10-CM | POA: Diagnosis not present

## 2013-05-11 DIAGNOSIS — J449 Chronic obstructive pulmonary disease, unspecified: Secondary | ICD-10-CM | POA: Diagnosis present

## 2013-05-11 DIAGNOSIS — Z794 Long term (current) use of insulin: Secondary | ICD-10-CM | POA: Diagnosis not present

## 2013-05-11 DIAGNOSIS — I69959 Hemiplegia and hemiparesis following unspecified cerebrovascular disease affecting unspecified side: Secondary | ICD-10-CM | POA: Diagnosis not present

## 2013-05-11 DIAGNOSIS — R262 Difficulty in walking, not elsewhere classified: Secondary | ICD-10-CM | POA: Diagnosis not present

## 2013-05-11 DIAGNOSIS — M25569 Pain in unspecified knee: Secondary | ICD-10-CM | POA: Diagnosis not present

## 2013-05-11 DIAGNOSIS — N181 Chronic kidney disease, stage 1: Secondary | ICD-10-CM | POA: Diagnosis not present

## 2013-05-11 DIAGNOSIS — IMO0002 Reserved for concepts with insufficient information to code with codable children: Secondary | ICD-10-CM | POA: Diagnosis present

## 2013-05-11 DIAGNOSIS — E119 Type 2 diabetes mellitus without complications: Secondary | ICD-10-CM | POA: Diagnosis not present

## 2013-05-11 DIAGNOSIS — N179 Acute kidney failure, unspecified: Secondary | ICD-10-CM | POA: Diagnosis not present

## 2013-05-11 DIAGNOSIS — G8929 Other chronic pain: Secondary | ICD-10-CM | POA: Diagnosis present

## 2013-05-11 DIAGNOSIS — G609 Hereditary and idiopathic neuropathy, unspecified: Secondary | ICD-10-CM | POA: Diagnosis not present

## 2013-05-11 DIAGNOSIS — Z882 Allergy status to sulfonamides status: Secondary | ICD-10-CM | POA: Diagnosis not present

## 2013-05-11 DIAGNOSIS — E1142 Type 2 diabetes mellitus with diabetic polyneuropathy: Secondary | ICD-10-CM | POA: Diagnosis present

## 2013-05-11 DIAGNOSIS — E1129 Type 2 diabetes mellitus with other diabetic kidney complication: Secondary | ICD-10-CM | POA: Diagnosis not present

## 2013-05-11 DIAGNOSIS — Z87891 Personal history of nicotine dependence: Secondary | ICD-10-CM | POA: Diagnosis not present

## 2013-05-11 DIAGNOSIS — Z833 Family history of diabetes mellitus: Secondary | ICD-10-CM | POA: Diagnosis not present

## 2013-05-11 LAB — HEMOLYSIS INDEX: Hemolysis Index: 433 — ABNORMAL HIGH (ref 0–18)

## 2013-05-11 LAB — GLUCOSE WHOLE BLOOD - POCT
Whole Blood Glucose POCT: 92 mg/dL (ref 70–100)
Whole Blood Glucose POCT: 113 mg/dL — ABNORMAL HIGH (ref 70–100)
Whole Blood Glucose POCT: 127 mg/dL — ABNORMAL HIGH (ref 70–100)
Whole Blood Glucose POCT: 258 mg/dL — ABNORMAL HIGH (ref 70–100)

## 2013-05-11 MED ORDER — METHYLPREDNISOLONE 4 MG PO KIT
PACK | ORAL | Status: DC
Start: 2013-05-11 — End: 2014-12-17

## 2013-05-11 MED ORDER — METHYLPREDNISOLONE 4 MG PO TABS
4.0000 mg | ORAL_TABLET | Freq: Every evening | ORAL | Status: DC
Start: 2013-05-13 — End: 2013-05-14
  Administered 2013-05-13: 4 mg via ORAL
  Filled 2013-05-11: qty 1

## 2013-05-11 MED ORDER — METHYLPREDNISOLONE 4 MG PO KIT
PACK | ORAL | Status: DC
Start: 2013-05-11 — End: 2013-05-11

## 2013-05-11 MED ORDER — METHYLPREDNISOLONE 4 MG PO TABS
4.0000 mg | ORAL_TABLET | Freq: Every morning | ORAL | Status: DC
Start: 2013-05-12 — End: 2013-05-14
  Administered 2013-05-12 – 2013-05-14 (×3): 4 mg via ORAL
  Filled 2013-05-11 (×2): qty 1

## 2013-05-11 MED ORDER — METHYLPREDNISOLONE SODIUM SUCC 125 MG IJ SOLR
60.0000 mg | Freq: Once | INTRAMUSCULAR | Status: AC
Start: 2013-05-11 — End: 2013-05-11
  Administered 2013-05-11: 60 mg via INTRAVENOUS
  Filled 2013-05-11: qty 2

## 2013-05-11 MED ORDER — METHYLPREDNISOLONE 4 MG PO TABS
8.0000 mg | ORAL_TABLET | Freq: Every morning | ORAL | Status: AC
Start: 2013-05-11 — End: 2013-05-11
  Administered 2013-05-11: 8 mg via ORAL
  Filled 2013-05-11: qty 2

## 2013-05-11 MED ORDER — METHYLPREDNISOLONE 4 MG PO TABS
8.0000 mg | ORAL_TABLET | Freq: Every evening | ORAL | Status: AC
Start: 2013-05-11 — End: 2013-05-12
  Administered 2013-05-11 – 2013-05-12 (×2): 8 mg via ORAL
  Filled 2013-05-11 (×2): qty 2

## 2013-05-11 MED ORDER — METHYLPREDNISOLONE 4 MG PO TABS
4.0000 mg | ORAL_TABLET | Freq: Every day | ORAL | Status: AC
Start: 2013-05-11 — End: 2013-05-13
  Administered 2013-05-11 – 2013-05-13 (×3): 4 mg via ORAL
  Filled 2013-05-11 (×3): qty 1

## 2013-05-11 MED ORDER — OXYCODONE-ACETAMINOPHEN 5-325 MG PO TABS
1.0000 | ORAL_TABLET | Freq: Four times a day (QID) | ORAL | Status: DC | PRN
Start: 2013-05-11 — End: 2013-05-14

## 2013-05-11 MED ORDER — METHYLPREDNISOLONE 4 MG PO TABS
4.0000 mg | ORAL_TABLET | Freq: Every day | ORAL | Status: AC
Start: 2013-05-11 — End: 2013-05-14
  Administered 2013-05-11 – 2013-05-14 (×4): 4 mg via ORAL
  Filled 2013-05-11 (×5): qty 1

## 2013-05-11 NOTE — Progress Notes (Signed)
Alb(UD) given 05/11/13 @0410  hrs.

## 2013-05-11 NOTE — PT Progress Note (Signed)
[facility]  Physical Therapy Treatment    Patient:  Shelby Barron MRN#:  [id number]  Unit:  [place] [facility] MEDICAL ONCOLOGY RENAL Room/Bed:  A2114/A2114-01    Time of treatment: Time Calculation  PT Received On: 05/11/13  Start Time: 1136  Stop Time: 1217  Time Calculation (min): 41 min           Visit # 3     Precautions:   Precautions  Weight Bearing Status: no restrictions  Other Precautions: Falls    Updated X-Rays/Tests/Labs:  Lab Results   Component Value Date/Time    HGB 12.6 05/09/2013 11:15 PM    HCT 38.6 05/09/2013 11:15 PM    K 4.5 05/10/2013  5:21 PM    NA 137 05/10/2013  5:21 PM         Subjective: "The gout pain is mixing with the muscle pain and I can't move."  Patient Goal: "to go home today"    Pain Assessment  Pain Assessment: Numeric Scale (0-10)  Pain Score: 10-severe pain  POSS Score: Awake and Alert  Pain Location: Foot;Leg;Knee  Pain Orientation: Right  Pain Descriptors: Lambert Mody;Constant  Pain Frequency: Continuous;Increases with movement  Effect of Pain on Daily Activities: severe  Pain Intervention(s): Medication (See eMAR);Repositioned  Multiple Pain Sites: No    Patient's medical condition is appropriate for Physical Therapy intervention at this time.  Patient is agreeable to participation in the therapy session. Nursing clears patient to participate with therapy services.    Objective:  Observation of Patient/Vital Signs:     Patient received in bed with Intravenous (IV) in place. RN made aware of patient's c/o gout pain. Dr. Eston Mould in to see patient during treatment and ordered IV dose of Prednisone for gout pain.    Filed Vitals:    05/11/13    BP: 137/58   Pulse: 69   Temp: 98.2 F (36.8 C)   Resp: 16   SpO2: 94% room air       Cognition  Arousal/Alertness: Appropriate responses to stimuli  Attention Span: Appears intact  Orientation Level: Oriented X4  Memory: Appears intact  Following Commands: Follows all commands and directions without difficulty  Safety  Awareness: minimal verbal instruction  Insights: Fully aware of deficits  Problem Solving: Able to problem solve independently           Functional Mobility  Rolling: Modified independent (Device)  Scooting to HOB: Independent  Functional Mobility Deferred (Comment): Pt unable to perform EOB/OOB activity due to severe pain throughout RLE     Locomotion  Ambulation: Unable to assess (Comment) (Due to c/o severe pain)                      Educated the patient to role of physical therapy, plan of care, goals of therapy and safety with mobility and ADLs, home safety.  Patient left in bed with call bell within reach. RN notified of session outcome.      Assessment: Patient with limited tolerance for functional mobility today due to c/o increased right lower extremity pain. Per MD patient will have an IV dose of Prednisone to decrease pain. She would benefit from BID PT treatment session following administration of medication to ensure maximal safe mobility and safe D/C home.        Plan: Treatment/Interventions: Exercise;Gait training;Functional transfer training;LE strengthening/ROM;Endurance training;Patient/family training;Equipment eval/education;Bed mobility;Compensatory technique education      PT Frequency: twice a day (patient needs f/u visit  after pain meds and steroids given )   Continue plan of care.    Goals: Goals  Goal Formulation: With patient  Time for Goal Acheivement: By time of discharge  Goals: Select goal  Pt Will Go Supine To Sit: Independently;Partly met  Pt Will Perform Sit to Stand: Independently;Partly met  Pt Will Ambulate: 51-100 feet;with standard walker;Independently;Partly met  Pt Will Go Up / Down Stairs: 3-5 stairs;With stand by assist;Not met           Discharge Recommendation: Home with home health PT;Home with supervision    Kiel Cockerell, PT, DPT  Spectralink [phone]  12:29 PM 05/11/2013

## 2013-05-11 NOTE — Plan of Care (Signed)
Problem: Safety  Goal: Patient will be free from injury during hospitalization  Outcome: Progressing  Pt bed on lowest position, call bell and side table within reach, non skid foot wear on, and hourly rounds maintained.    Problem: Pain  Goal: Patient's pain/discomfort is manageable  Outcome: Progressing  Pt c/o of pain in the right leg. Dilaudid 1 mg q4h given. Pt states some pain relief. Will continue to monitor Pt.    Comments:   Pt blood glucose within normal range and no insulin coverage per sliding scale needed.

## 2013-05-11 NOTE — Plan of Care (Signed)
Problem: Occupational Therapy  Goal: Patient condition is improving per Occupational Therapy Treatment Plan  Outcome: Progressing  Discharge Recommendation: Home with supervision;Home with home health OT (vs SNF)   DME Recommended for Discharge: [facility]    OT Frequency Recommended: 3-4x/wk     Early/Progressive Mobility Protocol Level: Step 6: Ambulate in Room (with assistance)  (Please See Therapy Evaluation for device and assistance level needed)    Treatment Interventions: ADL retraining;Functional transfer training;UE strengthening/ROM;Endurance training;Patient/Family training;Equipment eval/education       Goal Formulation: Patient;Family  Time For Goal Achievement: by time of discharge  ADL Goals  Patient will dress upper body: independently (not observed)  Patient will dress lower body: modified independently;with AE (not observed; educated on AE)  Mobility and Transfer Goals  Pt will perform functional transfers: modified independently (not observed )  Other Goal: Pt will perform bed mobility I as prep for ADLs (not observed)

## 2013-05-11 NOTE — Plan of Care (Addendum)
Problem: Physical Therapy  Goal: Patient condition is improving per Physical Therapy Treatment Plan  Outcome: Progressing  Discharge Recommendation: SNF       Treatment/Interventions: Exercise;Gait training;Functional transfer training;LE strengthening/ROM;Endurance training;Patient/family training;Equipment eval/education;Bed mobility;Compensatory technique education  PT Frequency: 4-5x/wk     Early/Progressive Mobility Protocol Level: Step 7: Ambulate in Hallway (with assistance)  (Please See Therapy Evaluation for device and assistance level needed)    Goals:   Goals  Goal Formulation: With patient  Time for Goal Acheivement: By time of discharge  Goals: Select goal  Pt Will Go Supine To Sit: Independently;Partly met  Pt Will Perform Sit to Stand: Independently;Partly met  Pt Will Ambulate: 51-100 feet;with standard walker;Independently;Partly met  Pt Will Go Up / Down Stairs: 3-5 stairs;With stand by assist;Not met

## 2013-05-11 NOTE — OT Progress Note (Signed)
Occupational Therapy Note  [facility]  Occupational Therapy Treatment     Patient: Shelby Barron    MRN#: [id number]   Unit: [place] [place] [facility] MEDICAL ONCOLOGY RENAL  Bed: A2114/A2114-01    Time of treatment: Time Calculation  OT Received On: 05/11/13  Start Time: 1342  Stop Time: 1407  Time Calculation (min): 25 min           Visit # 2    Precautions and Contraindications:    Precautions  Weight Bearing Status: no restrictions  Other Precautions: Falls    Updated Labs:  Lab Results   Component Value Date/Time    HGB 12.6 05/09/2013 11:15 PM    HCT 38.6 05/09/2013 11:15 PM    K 4.5 05/10/2013  5:21 PM    NA 137 05/10/2013  5:21 PM       Subjective:  .  "I can't get out of bed, my foot really hurts. Knee and leg is nothing to comparing this foot pain"      Patient's medical condition is appropriate for Occupational Therapy intervention at this time.  Patient is agreeable to participation in the therapy session. Nursing clears patient for therapy.  Pain Assessment  Pain Assessment: Numeric Scale (0-10)  Pain Score: 10-severe pain  POSS Score: Awake and Alert  Pain Location: Leg;Knee;Foot  Pain Orientation: Right  Pain Frequency: Constant/continuous  Effect of Pain on Daily Activities: severe  Pain Intervention(s): Repositioned;Medication (See eMAR)      Objective:  Observation of Patient/Vital Signs:  vss      Patient received in bed with Intravenous (IV) in place.    Cognition  Arousal/Alertness: Appropriate responses to stimuli  Attention Span: Appears intact  Orientation Level: Oriented X4  Memory: Appears intact  Following Commands: Follows all commands and directions without difficulty  Safety Awareness: minimal verbal instruction  Insights: Fully aware of deficits;Educated in safety awareness  Problem Solving: Able to problem solve independently         Self-care and Home Management  Eating: Setup;in bed                                    Educated the patient to role of occupational therapy, plan of  care, goals of therapy and safety with mobility and ADLs, energy conservation techniques, home safety.     Patient left in bed with call bell within reach. RN notified of session outcome      Assessment: Pt received in bed in supine, c/o severe pain 10/10 t/o her R LE, especially R foot. Had received medication but hadn't relieved the pain yet at time of treatment. Pt unable to perform EOB or OOB activity at this time due to increased pain level. However, gladly received education on AE for LB self care. Pt introduced and educated on use of sock aid, reacher, LH sponge, LH shoe horn. Educated on information where to obtain the AE. Verbalized sound understanding and great interest in equipment. Will con't to benefit from skilled OT services to max I with self care and fucntional mobility.       Plan:  Goal Formulation: Patient;Family  OT Plan  Treatment Interventions: ADL retraining;Functional transfer training;UE strengthening/ROM;Endurance training;Patient/Family training;Equipment eval/education  Discharge Recommendation: Home with supervision;Home with home health OT  DME Recommended for Discharge: [facility]  OT Frequency Recommended: 3-4x/wk  OT - Next Visit Recommendation: 05/14/13    Time For Goal Achievement:  by time of discharge  ADL Goals  Patient will dress upper body: independently (not observed)  Patient will dress lower body: modified independently;with AE (not observed; educated on AE)  Mobility and Transfer Goals  Pt will perform functional transfers: modified independently (not observed )  Other Goal: Pt will perform bed mobility I as prep for ADLs (not observed)        Continue plan of care.       DME Recommended for Discharge: [facility]  Discharge Recommendation: Home with supervision;Home with home health OT         Caryn Section, OTR/L #[id number]  05/11/2013 4:32 PM

## 2013-05-11 NOTE — Progress Notes (Signed)
[facility] PROGRESS NOTE    Date Time: 05/11/2013 9:45 AM  Patient Name: Shelby Barron  Attending Physician: [name], MD    CC: crf    Assessment:                                  1. CKD stage 4: Currently at baseline  2. Acidosis 2/2 CKD. On Bicarb.  3. HTN: Better  4. Decreased UOP: Improving.   5. Leucocytosis: ?etiology. Cultures are neg.  6. Hamstring injury on the right. Ortho. Is managing.    Recommendations:   Continue current therapy.  Check labs in the AM.      Case discussed with: Patient  Shelby Baars, MD  [facility]  [phone] (office)  X [phone] [facility])      Subjective: Having pain in her lower right leg relieved with Dilaudid    Review of Systems:   Review of Systems - Respiratory ROS: no cough, shortness of breath, or wheezing  Cardiovascular ROS: no chest pain or dyspnea on exertion  Musculoskeletal ROS: Leg pain as described above      Physical Exam:     Filed Vitals:    05/10/13 0706 05/10/13 1514 05/10/13 2238 05/11/13 0656   BP: 131/63 135/65 147/68 137/58   Pulse: 70 67 65 69   Temp: 97.9 F (36.6 C) 97 F (36.1 C) 98 F (36.7 C) 98.2 F (36.8 C)   TempSrc:   Oral Oral   Resp: 18 18 18 16    Height:       Weight:       SpO2: 98% 99% 99% 94%       Intake and Output Summary (Last 24 hours) at Date Time    Intake/Output Summary (Last 24 hours) at 05/11/13 0945  Last data filed at 05/10/13 1700   Gross per 24 hour   Intake    600 ml   Output      0 ml   Net    600 ml       General: awake, alert, oriented x 3, no acute distress.  Cardiovascular: regular rate and rhythm, no murmurs, rubs or gallops  Lungs: clear to auscultation bilaterally, without wheezing, rhonchi, or rales  Abdomen: soft, non-tender, non-distended, normoactive bowel sounds, no palpable masses, no hepatosplenomegaly, no rebound or guarding  Extremities: no clubbing, cyanosis, or edema      Meds:      Scheduled Meds: PRN Meds:         amLODIPine 10 mg Oral Daily    clopidogrel 75 mg Oral Daily   enoxaparin 30 mg Subcutaneous Daily   fluticasone-salmeterol 2 puff Inhalation BID   furosemide 20 mg Oral BID   insulin glargine 20 Units Subcutaneous Q12H   labetalol 200 mg Oral Q12H SCH   lisinopril 5 mg Oral BID   pantoprazole 40 mg Oral QAM AC   pregabalin 50 mg Oral Daily   sodium bicarbonate 650 mg Oral BID   [DISCONTINUED] amLODIPine 10 mg Oral Daily   [DISCONTINUED] clopidogrel 75 mg Oral Once   [DISCONTINUED] furosemide 20 mg Oral Daily   [DISCONTINUED] labetalol 200 mg Oral BID         Continuous Infusions:       . sodium chloride 125 mL/hr at 05/10/13 1647         albuterol 2.5 mg Q4H WA PRN   dextrose  15 g of glucose PRN   dextrose 25 mL PRN   glucagon (rDNA) 1 mg PRN   HYDROmorphone 1 mg Q4H PRN   insulin aspart 1-5 Units TID AC PRN   naloxone 0.2 mg PRN   ondansetron 4 mg Q4H PRN   zolpidem 5 mg QHS PRN             Labs:     Lab 05/09/13 2315 05/07/13 1225   WBC 11.69* 6.61   HGB 12.6 12.8   HCT 38.6 39.4   PLT 220 221       Lab 05/10/13 1721 05/09/13 2315 05/07/13 1225   NA 137 142 144   K 4.5 4.5 4.2   CL 106 107 106   CO2 19* 20* 24   BUN 35.0* 30.0* 30.0*   CREAT 2.2* 2.0* 2.2*   CA 9.4 10.0 9.4   ALB -- -- 3.4*   PHOS -- -- 3.8   MG -- -- --   GLU 147* 190* 138*   EGFR 26.4 29.4 26.4         Lab 05/10/13 1721   URINETYPE Clean Catch   COLORUA Yellow   CLARITYUA Clear   SPECGRAVUA 1.015   URINEPH 5.0   NITRITEUA Negative   PROTEINUA --   GLUUA --   KETONESUA Negative   UROBILIUA Negative   BILIUA Negative   BLOODUA Negative   RBCUA 0 - 5   WBCUA 0 - 5   URINEBACTERI Rare   GRANCASTUA --       Lab 05/07/13 1225   URINECREATIN --   PROTEINUA --   URINEPROTEIN --   MICROALBUMIN --   NAUR --   CREATUR 192.3   OSMOLUR --       Imaging personally reviewed, including: No results found.        Signed by: Shelby Baars, MD

## 2013-05-11 NOTE — Discharge Summary (Signed)
Discharge Date:      ATTENDING PHYSICIAN:  Jackquline Berlin, MD     DATE OF ADMISSION:  May 10, 2013.     DATE OF DISCHARGE:  May 11, 2013.     HISTORY OF PRESENT ILLNESS:  The patient is a 73 year old female with a history of renal cell carcinoma,  status post partial left nephrectomy, chronic kidney disease, neuropathy,  hyperlipidemia, prior history of cerebrovascular accident, hypertension,  and gout, along with diabetes, admitted to [facility] with a fall,  sustaining severe right leg pain.  Workup in the emergency room was  unremarkable, and she was diagnosed with a right hamstring injury and  admitted for pain control, IV pain medication.  During her stay, she was  seen by orthopedics, and the conclusion was a right medial hamstring muscle  strain, with no other injury.  Recommended weightbearing as tolerated.     Hospital stay was complicated with worsening of her gout symptoms, severe  pain in the right foot, and she was started on IV steroids and colchicine,  along with pain control.  If she is stable, she will be able to be  discharged later today or in the morning, if she is able to ambulate and  her pain is controlled.     DISCHARGE DIAGNOSES:  1.  Fall, right hamstring sprain/strain; no other injury:  Weightbearing as  tolerated.    2.  Acute gout flare:  On medication.  3.  Renal insufficiency, with creatinine around about 2 to 2.2.  4.  Hypertension.     DISCHARGE MEDICATIONS:  Per medication reconciliation.           D:  05/11/2013 14:07 PM by Dr. Jordan Likes A. Eston Mould, MD [phone])  T:  05/11/2013 20:58 PM by       Everlean Cherry: [id number]) (Doc ID: [id number])

## 2013-05-11 NOTE — Plan of Care (Signed)
Problem: Safety  Goal: Patient will be free from injury during hospitalization  Outcome: Progressing  Pt is S/P fall at home, injuring her hamstring , voiced having pain in her BLE with  Movement and Dilaudid given as ordered with good relief.  Plan: continue to assess pain level and medicate as needed.

## 2013-05-11 NOTE — PT Progress Note (Signed)
[facility]  Physical Therapy Treatment    Patient:  Shelby Barron MRN#:  [id number]  Unit:  [place] [facility] MEDICAL ONCOLOGY RENAL Room/Bed:  A2114/A2114-01    Time of treatment: Time Calculation  PT Received On: 05/11/13  Start Time: 1603  Stop Time: 1618  Time Calculation (min): 15 min           Visit # 4     Precautions:   Precautions  Weight Bearing Status: no restrictions  Other Precautions: Falls    Updated X-Rays/Tests/Labs:  Lab Results   Component Value Date/Time    HGB 12.6 05/09/2013 11:15 PM    HCT 38.6 05/09/2013 11:15 PM    K 4.5 05/10/2013  5:21 PM    NA 137 05/10/2013  5:21 PM         Subjective: "I have a little less pain around the toe but it still hurts so bad."  Patient Goal: "get rid of the pain and be able to move again"    Pain Assessment  Pain Assessment: Numeric Scale (0-10)  Pain Score: 8-severe pain  POSS Score: Awake and Alert  Pain Location: Foot;Leg;Knee  Pain Orientation: Right  Pain Descriptors: Sharp  Pain Frequency: Continuous;Increases with movement  Effect of Pain on Daily Activities: severe  Pain Intervention(s): Medication (See eMAR);Repositioned  Multiple Pain Sites: No    Patient's medical condition is appropriate for Physical Therapy intervention at this time.  Patient is agreeable to participation in the therapy session. Nursing clears patient for therapy.    Objective:  Observation of Patient/Vital Signs:     Patient received in bed with Intravenous (IV) in place.    Cognition  Arousal/Alertness: Appropriate responses to stimuli  Attention Span: Appears intact  Orientation Level: Oriented X4  Memory: Appears intact  Following Commands: Follows all commands and directions without difficulty  Safety Awareness: minimal verbal instruction  Insights: Fully aware of deficits;Educated in safety awareness  Problem Solving: Able to problem solve independently           Functional Mobility  Rolling: Modified independent (Device)  Supine to Sit: Dependent;Unable to  assess (Comment) (Patient with c/o increased pain with movement, unable to make it to EOB)  Scooting to [facility]: Independent  Sit to Stand: Unable to assess (Comment) (Pt unable to get to edge of bed due to pain)  Functional Mobility Deferred (Comment): Pt unable to perform EOB/OOB activity due to severe pain throughout RLE     Locomotion  Ambulation: Unable to assess (Comment) (due to increased pain with movement)                      Educated the patient to role of physical therapy, plan of care, goals of therapy and safety with mobility and ADLs.  Patient left in bed with call bell within reach. RN notified of session outcome.      Assessment: Patient continues with c/o increased right lower extremity pain that limits her ability to perform functional mobility. She is unable to sit at EOB due to increased right lower extremity pain. Patient is not appropriate for D/C home at this time and D/C recommendation is changed to SNF rehab. She would benefit from continued PT treatment to allow for maximal safe mobility, decreased risk for falls and improved activity tolerance.         Plan: Treatment/Interventions: Exercise;Gait training;Functional transfer training;LE strengthening/ROM;Endurance training;Patient/family training;Equipment eval/education;Bed mobility;Compensatory technique education      PT Frequency: 4-5x/wk  Continue plan of care.    Goals: Goals  Goal Formulation: With patient  Time for Goal Acheivement: By time of discharge  Goals: Select goal  Pt Will Go Supine To Sit: Independently;Partly met  Pt Will Perform Sit to Stand: Independently;Partly met  Pt Will Ambulate: 51-100 feet;with standard walker;Independently;Partly met  Pt Will Go Up / Down Stairs: 3-5 stairs;With stand by assist;Not met           Discharge Recommendation: SNF    Tomorrow Dehaas, PT, DPT  Spectralink [phone]  4:38 PM 05/11/2013

## 2013-05-12 LAB — COMPREHENSIVE METABOLIC PANEL
ALT: 21 U/L (ref 0–55)
AST (SGOT): 13 U/L (ref 5–34)
Albumin/Globulin Ratio: 0.9 (ref 0.9–2.2)
Albumin: 2.7 g/dL — ABNORMAL LOW (ref 3.5–5.0)
Alkaline Phosphatase: 70 U/L (ref 37–106)
Anion Gap: 9 (ref 5.0–15.0)
BUN: 39 mg/dL — ABNORMAL HIGH (ref 7.0–19.0)
Bilirubin, Total: 0.5 mg/dL (ref 0.2–1.2)
CO2: 20 mEq/L — ABNORMAL LOW (ref 22–29)
Calcium: 8.9 mg/dL (ref 7.9–10.2)
Chloride: 108 mEq/L (ref 100–111)
Creatinine: 2 mg/dL — ABNORMAL HIGH (ref 0.6–1.0)
Globulin: 3 g/dL (ref 2.0–3.6)
Glucose: 293 mg/dL — ABNORMAL HIGH (ref 70–100)
Potassium: 4.2 mEq/L (ref 3.5–5.1)
Protein, Total: 5.7 g/dL — ABNORMAL LOW (ref 6.0–8.3)
Sodium: 137 mEq/L (ref 136–145)

## 2013-05-12 LAB — CBC
Hematocrit: 31.3 % — ABNORMAL LOW (ref 37.0–47.0)
Hgb: 10.2 g/dL — ABNORMAL LOW (ref 12.0–16.0)
MCH: 27.6 pg — ABNORMAL LOW (ref 28.0–32.0)
MCHC: 32.6 g/dL (ref 32.0–36.0)
MCV: 84.8 fL (ref 80.0–100.0)
MPV: 11.5 fL (ref 9.4–12.3)
Nucleated RBC: 0 /100 WBC (ref 0–1)
Platelets: 182 10*3/uL (ref 140–400)
RBC: 3.69 10*6/uL — ABNORMAL LOW (ref 4.20–5.40)
RDW: 15 % (ref 12–15)
WBC: 9.41 10*3/uL (ref 3.50–10.80)

## 2013-05-12 LAB — HEMOLYSIS INDEX: Hemolysis Index: 1 (ref 0–18)

## 2013-05-12 LAB — GFR: EGFR: 29.4

## 2013-05-12 LAB — MAGNESIUM: Magnesium: 1.8 mg/dL (ref 1.6–2.6)

## 2013-05-12 LAB — GLUCOSE WHOLE BLOOD - POCT
Whole Blood Glucose POCT: 281 mg/dL — ABNORMAL HIGH (ref 70–100)
Whole Blood Glucose POCT: 158 mg/dL — ABNORMAL HIGH (ref 70–100)
Whole Blood Glucose POCT: 163 mg/dL — ABNORMAL HIGH (ref 70–100)
Whole Blood Glucose POCT: 224 mg/dL — ABNORMAL HIGH (ref 70–100)

## 2013-05-12 LAB — PHOSPHORUS: Phosphorus: 2.9 mg/dL (ref 2.3–4.7)

## 2013-05-12 MED ORDER — BISACODYL 5 MG PO TBEC
10.0000 mg | DELAYED_RELEASE_TABLET | Freq: Once | ORAL | Status: AC
Start: 2013-05-12 — End: 2013-05-12
  Administered 2013-05-12: 10 mg via ORAL
  Filled 2013-05-12: qty 2

## 2013-05-12 MED ORDER — DOCUSATE SODIUM 100 MG PO CAPS
100.0000 mg | ORAL_CAPSULE | Freq: Every day | ORAL | Status: DC | PRN
Start: 2013-05-12 — End: 2013-05-14
  Administered 2013-05-12: 100 mg via ORAL
  Filled 2013-05-12: qty 1

## 2013-05-12 NOTE — Plan of Care (Signed)
Problem: Safety  Goal: Patient will be free from injury during hospitalization  Outcome: Progressing  Pt. C/O abdominal pain, dilaudid 1 mg IV x1 given with relief. BS 258 mg/dl, coverage given per sliding scale, lantus given as scheduled, snack provided, pt. Been monitored for s&s of hypo/hyperglycemia.

## 2013-05-12 NOTE — PT Progress Note (Signed)
[facility]  Physical Therapy Treatment    Patient:  Shelby Barron MRN#:  [id number]  Unit:  [place] [facility] MEDICAL ONCOLOGY RENAL Room/Bed:  A2114/A2114-01    Time of treatment: Time Calculation  PT Received On: 05/12/13  Start Time: 1100  Stop Time: 1200  Time Calculation (min): 60 min           Visit # 5     Precautions:   Precautions  Weight Bearing Status: no restrictions  Other Precautions: Falls    Updated X-Rays/Tests/Labs:  Lab Results   Component Value Date/Time    HGB 10.2* 05/12/2013  5:00 AM    HCT 31.3* 05/12/2013  5:00 AM    K 4.2 05/12/2013  5:00 AM    NA 137 05/12/2013  5:00 AM    INR 1.1 11/20/2012  2:40 PM         Subjective: I am doing much better       Pain Assessment  Pain Assessment: Numeric Scale (0-10)  Pain Score: 7-severe pain  POSS Score: Awake and Alert  Pain Location: Leg  Pain Orientation: Right    Patient's medical condition is appropriate for Physical Therapy intervention at this time.  Patient is agreeable to participation in the therapy session.    Objective:  Observation of Patient/Vital Signs: vss    Patient received in bed with Intravenous (IV) in place.    Cognition  Arousal/Alertness: Appropriate responses to stimuli  Attention Span: Appears intact  Orientation Level: Oriented X4  Memory: Appears intact  Following Commands: Follows all commands and directions without difficulty  Safety Awareness: minimal verbal instruction  Insights: Fully aware of deficits;Educated in safety awareness  Problem Solving: Able to problem solve independently           Functional Mobility  Sit to Supine: Independent  Sit to Stand: Stand by assistance  Stand to Sit: Stand by assistance     Locomotion  Ambulation: with standard walker;stand by assistance  Ambulation Distance (Feet): 20 Feet (x2)  Pattern: decreased cadence;decreased step length                      Educated the patient to role of physical therapy, plan of care, goals of therapy and safety with mobility and ADLs, energy  conservation techniques.  Patient left in bed with call bell within reach. RN notified of session outcome.      Assessment: Pt making good progress.  Less pain per pt.  Pt will have 24 hour assist at home and is agreeable to home health PT.  Since pt is progressing if she continues to improve she could go home with family and home health services.        Plan: Treatment/Interventions: Gait training;Functional transfer training;Endurance training;Bed mobility      PT Frequency: 4-5x/wk   Continue plan of care.    Goals: Goals  Goal Formulation: With patient  Time for Goal Acheivement: By time of discharge  Goals: Select goal  Pt Will Go Supine To Sit: Independently;Partly met  Pt Will Perform Sit to Stand: Independently;Partly met  Pt Will Ambulate: 51-100 feet;with standard walker;Independently;Partly met  Pt Will Go Up / Down Stairs: 3-5 stairs;With stand by assist;Not met           Discharge Recommendation: Home with supervision;Home with home health PT (vs SNF)  Nicky Pugh [phone]  05/12/2013 12:17 PM  Physical Therapist Assistant

## 2013-05-12 NOTE — PT Progress Note (Signed)
Shelby Barron, PT, DPT [phone]

## 2013-05-12 NOTE — Progress Notes (Signed)
TC received from patient and son requesting that patient be discharged to [facility],    Patient reports that family does not feel that they are equipped to take care of her in her home.  Liaison confirmed that referral had been sent by CM [facility]) and that CM would follow up with her in the AM.

## 2013-05-12 NOTE — Progress Notes (Signed)
Date Time: 05/12/2013 3:37 PM  Patient Name: Shelby Barron  Attending Physician: [name], MD        Assessment:                                  1. seere pain in the right  toe  :2. Acidosis 2/2 CKD. On Bicarb.  3. HTN: Better  4. Gout flare  5. Hamstring injury on the right. Ortho. Is managing.    Recommendations:     Continue prednisone for gout  Pain meds   Leucocytosis resolved today  Hold discharge today      Case discussed with: Patient      Subjective: severe pain in the right  toe unable to bear wt and walk     Review of Systems:   Review of Systems - Respiratory ROS: no cough, shortness of breath, or wheezing  Cardiovascular ROS: no chest pain or dyspnea on exertion  Musculoskeletal ROS: Leg pain as described above      Physical Exam:     Filed Vitals:    05/11/13 1609 05/11/13 1930 05/12/13 0728 05/12/13 1535   BP: 145/66 146/65 165/68 161/69   Pulse: 85 76 71 75   Temp: 98.2 F (36.8 C) 98 F (36.7 C) 96.6 F (35.9 C) 96.3 F (35.7 C)   TempSrc: Oral Oral Oral Oral   Resp: 18 16 16 16    Height:       Weight:       SpO2: 95% 93% 96% 96%       Intake and Output Summary (Last 24 hours) at Date Time  No intake or output data in the 24 hours ending 05/12/13 1537    General: awake, alert, oriented x 3, no acute distress.  Cardiovascular: regular rate and rhythm, no murmurs, rubs or gallops  Lungs: clear to auscultation bilaterally, without wheezing, rhonchi, or rales  Abdomen: soft, non-tender, non-distended, normoactive bowel sounds, no palpable masses, no hepatosplenomegaly, no rebound or guarding  Extremities: no clubbing, cyanosis, or edema tenderness at the right  toe       Meds:      Scheduled Meds: PRN Meds:           amLODIPine 10 mg Oral Daily   clopidogrel 75 mg Oral Daily   enoxaparin 30 mg Subcutaneous Daily   fluticasone-salmeterol 2 puff Inhalation BID   furosemide 20 mg Oral BID   insulin glargine 20 Units Subcutaneous Q12H   labetalol 200 mg Oral Q12H SCH   lisinopril 5 mg  Oral BID   methylprednisolone 4 mg Oral QD after lunch   methylprednisolone 4 mg Oral QD after dinner   methylprednisolone 4 mg Oral QAM AC   [START ON 05/13/2013] methylprednisolone 4 mg Oral QHS   methylprednisolone 8 mg Oral QHS   pantoprazole 40 mg Oral QAM AC   pregabalin 50 mg Oral Daily   sodium bicarbonate 650 mg Oral BID         Continuous Infusions:         albuterol 2.5 mg Q4H WA PRN   dextrose 15 g of glucose PRN   dextrose 25 mL PRN   glucagon (rDNA) 1 mg PRN   HYDROmorphone 1 mg Q4H PRN   insulin aspart 1-5 Units TID AC PRN   naloxone 0.2 mg PRN   ondansetron 4 mg Q4H PRN   oxyCODONE-acetaminophen 1 tablet Q6H PRN  zolpidem 5 mg QHS PRN             Labs:       Lab 05/12/13 0500 05/09/13 2315 05/07/13 1225   WBC 9.41 11.69* 6.61   HGB 10.2* 12.6 12.8   HCT 31.3* 38.6 39.4   PLT 182 220 221       Lab 05/12/13 0500 05/10/13 1721 05/09/13 2315 05/07/13 1225   NA 137 137 142 --   K 4.2 4.5 4.5 --   CL 108 106 107 --   CO2 20* 19* 20* --   BUN 39.0* 35.0* 30.0* --   CREAT 2.0* 2.2* 2.0* --   CA 8.9 9.4 10.0 --   ALB 2.7* -- -- 3.4*   PHOS 2.9 -- -- 3.8   MG 1.8 -- -- --   GLU 293* 147* 190* --   EGFR 29.4 26.4 29.4 --         Lab 05/10/13 1721   URINETYPE Clean Catch   COLORUA Yellow   CLARITYUA Clear   SPECGRAVUA 1.015   URINEPH 5.0   NITRITEUA Negative   PROTEINUA --   GLUUA --   KETONESUA Negative   UROBILIUA Negative   BILIUA Negative   BLOODUA Negative   RBCUA 0 - 5   WBCUA 0 - 5   URINEBACTERI Rare   GRANCASTUA --       Lab 05/07/13 1225   URINECREATIN --   PROTEINUA --   URINEPROTEIN --   MICROALBUMIN --   NAUR --   CREATUR 192.3   OSMOLUR --       Imaging personally reviewed, including: No results found.        Signed by: Cathleen Fears, MD

## 2013-05-12 NOTE — Progress Notes (Signed)
[facility] PROGRESS NOTE    Date Time: 05/12/2013 8:28 AM  Patient Name: Shelby Barron  Attending Physician: [name], MD    CC: crf    Assessment:                                  1. CKD stage 4: Currently at baseline  2. Acidosis 2/2 CKD. On Bicarb.  3. HTN: Better  4. Gout flare  5. Leucocytosis: ?etiology. Cultures are neg.  6. Hamstring injury on the right. Ortho. Is managing.    Recommendations:     Continue prednisone for gout  Avoid NSAIDS    Case discussed with: Patient  Alla German, MD  [facility]  [phone] (office)  [phone] (FFX Spectra-Link)      Subjective: Feels better - less pain    Review of Systems:   Review of Systems - Respiratory ROS: no cough, shortness of breath, or wheezing  Cardiovascular ROS: no chest pain or dyspnea on exertion  Musculoskeletal ROS: Leg pain as described above      Physical Exam:     Filed Vitals:    05/11/13 0656 05/11/13 1609 05/11/13 1930 05/12/13 0728   BP: 137/58 145/66 146/65 165/68   Pulse: 69 85 76 71   Temp: 98.2 F (36.8 C) 98.2 F (36.8 C) 98 F (36.7 C) 96.6 F (35.9 C)   TempSrc: Oral Oral Oral Oral   Resp: 16 18 16 16    Height:       Weight:       SpO2: 94% 95% 93% 96%       Intake and Output Summary (Last 24 hours) at Date Time  No intake or output data in the 24 hours ending 05/12/13 0828    General: awake, alert, oriented x 3, no acute distress.  Cardiovascular: regular rate and rhythm, no murmurs, rubs or gallops  Lungs: clear to auscultation bilaterally, without wheezing, rhonchi, or rales  Abdomen: soft, non-tender, non-distended, normoactive bowel sounds, no palpable masses, no hepatosplenomegaly, no rebound or guarding  Extremities: no clubbing, cyanosis, or edema      Meds:      Scheduled Meds: PRN Meds:           amLODIPine 10 mg Oral Daily   clopidogrel 75 mg Oral Daily   enoxaparin 30 mg Subcutaneous Daily   fluticasone-salmeterol 2 puff Inhalation BID   furosemide 20 mg Oral BID   insulin  glargine 20 Units Subcutaneous Q12H   labetalol 200 mg Oral Q12H SCH   lisinopril 5 mg Oral BID   methylprednisolone 4 mg Oral QD after lunch   methylprednisolone 4 mg Oral QD after dinner   methylprednisolone 4 mg Oral QAM AC   [START ON 05/13/2013] methylprednisolone 4 mg Oral QHS   [COMPLETED] methylprednisolone 8 mg Oral QAM AC   methylprednisolone 8 mg Oral QHS   [COMPLETED] methylprednisolone 60 mg Intravenous Once   pantoprazole 40 mg Oral QAM AC   pregabalin 50 mg Oral Daily   sodium bicarbonate 650 mg Oral BID         Continuous Infusions:       . [DISCONTINUED] sodium chloride 125 mL/hr at 05/10/13 1647         albuterol 2.5 mg Q4H WA PRN   dextrose 15 g of glucose PRN   dextrose 25 mL PRN   glucagon (rDNA) 1 mg PRN  HYDROmorphone 1 mg Q4H PRN   insulin aspart 1-5 Units TID AC PRN   naloxone 0.2 mg PRN   ondansetron 4 mg Q4H PRN   oxyCODONE-acetaminophen 1 tablet Q6H PRN   zolpidem 5 mg QHS PRN             Labs:       Lab 05/12/13 0500 05/09/13 2315 05/07/13 1225   WBC 9.41 11.69* 6.61   HGB 10.2* 12.6 12.8   HCT 31.3* 38.6 39.4   PLT 182 220 221       Lab 05/12/13 0500 05/10/13 1721 05/09/13 2315 05/07/13 1225   NA 137 137 142 --   K 4.2 4.5 4.5 --   CL 108 106 107 --   CO2 20* 19* 20* --   BUN 39.0* 35.0* 30.0* --   CREAT 2.0* 2.2* 2.0* --   CA 8.9 9.4 10.0 --   ALB 2.7* -- -- 3.4*   PHOS 2.9 -- -- 3.8   MG 1.8 -- -- --   GLU 293* 147* 190* --   EGFR 29.4 26.4 29.4 --         Lab 05/10/13 1721   URINETYPE Clean Catch   COLORUA Yellow   CLARITYUA Clear   SPECGRAVUA 1.015   URINEPH 5.0   NITRITEUA Negative   PROTEINUA --   GLUUA --   KETONESUA Negative   UROBILIUA Negative   BILIUA Negative   BLOODUA Negative   RBCUA 0 - 5   WBCUA 0 - 5   URINEBACTERI Rare   GRANCASTUA --       Lab 05/07/13 1225   URINECREATIN --   PROTEINUA --   URINEPROTEIN --   MICROALBUMIN --   NAUR --   CREATUR 192.3   OSMOLUR --       Imaging personally reviewed, including: No results found.        Signed by: Alla German, MD

## 2013-05-12 NOTE — Plan of Care (Signed)
Problem: Safety  Goal: Patient will be free from injury during hospitalization  Outcome: Progressing  Pt bed on lowest position, call bell and side table within reach, non skid foot wear on, and hourly rounds maintained.     Problem: Pain  Goal: Patient's pain/discomfort is manageable  Outcome: Progressing  Pt complains of pain on movement. Dilaudid 1 mg x1 given during shift for PT therapy. Will continue to monitor Pt.

## 2013-05-12 NOTE — Progress Notes (Signed)
Case Management Initial Discharge Planning Assessment    PLAN OF CARE: Writer spoke to pt to complete IDPA. Pt deciding on d/c plan home w/HH PT vs SNF. [name] would like pt to d/c to SNF ([facility]). Writer sent referral in [place]. CM to f/u        Psychosocial/Demographic Information   Name of interviewee: Pt    Healthcare Decision Maker (HDM) (if other than the patient)    HDM - Relationship to Patient    HDM - Contact Information    Pt lives with Alone    Type of residence where patient lives Living Arrangements: Spouse/significant other;Children]  Type of Home: House]  Home Layout: Multi-level;Stairs to enter with rails (add number in comment)]     Prior level of functioning (ambulation & ADLs) Baseline Activity Level: Community ambulation]   ]     Correct Insurance listed on face sheet - verified with the patient/HDM Yes.       Any additional emergency contacts? Extended Emergency Contact Information  Primary Emergency Contact: Shelby Barron   [place]  Home Phone: [phone]  Relation: Daughter   Does the patient have an Advance Directive? <no information>  Advance Directive: Patient has advance directive, copy in chart]     Is the POA/Guardianship documentation in shadow chart? (if applicable)  Healthcare Agent Appointed: No]  Healthcare Agent's Name: no]   ]   Source of Income (SSDI. SSI. Social Security, pension, employment, Catering manager)      Economist in Place  Name of Primary Care Physician verified in patient banner (update in patient banner if not listed).  If no PCP, call [phone] with patient in room to get them connected with a PCP. Shelby Flock, MD  None   What DME does the patient currently own/have at home? (rolling walker, hospital bed, home O2, BiPAP/CPAP, bedside commode, cane, hoyer lift)  ]  Assistive Devices: Walker]  DME Currently at Home: Standard walker]   Has the patient been to an Acute Rehab or SNF in the past?  If so, where? Yes- [facility].    Does the patient currently have home health or hospice/palliative services in place?  If so, list agency name. No    Would the patient benefit from a PT/OT order? If so, is it ordered? Yes    Does the patient already have community dialysis set up?  If so, where? N/a      Readmission Assessment  Current LACE Score Reviewed? Yes/No 11   Is this patient an inpatient to inpatient 30 day readmission? no   Does the patient have difficulty obtaining his/her medications? no   Follow-up appt made with: [facility], [facility] or private pcp? Pcp/ nephrologist    Does the patient have difficulty getting to his/her appointment? no       Anticipated Discharge Plan  Discussed Anticipated Discharge Date and Discharge Disposition Possibilities with: __x_Patient   ___Healthcare Decision Maker  ___Other   Anticipated Disposition: Option A Pt desires to go home. She hopes her son can assist her w/getting up her steps in the home   Anticipated Disposition: Option B [name] would like pt to d/c to SNF for rehab ([facility]?)   Who will transport the patient when ready for discharge? (offer wheelchair [place] service if patient/family cannot identify transport plan) PTS    If applicable, were SNF or Hospice choices provided?    Palliative Care Consult needed? (if yes, contact  attending MD)  no   Geriatrics Consult needed? (if yes, contact attending MD) no   Elderlink Referral needed? (if yes, refer through [facility]) no   TCM Referral needed? (if yes, refer through [facility]) no   PACE Referral needed? (if yes, refer through [facility]) no   Are there any potential barriers to discharge identified?      ___Lack of Insurance  ___Lack of Health Literacy  ___Undocumented  ___No resources for meds or medical care  ___Transportation issues  ___Language/Cultural/Spiritual  ___Cognitive level / capacity  ___Psychiatric or substance abuse issues  ___Co-morbidities  ___Potential abuse or neglect  ___Safety issues in the  home  ___Potential placement issues  ___Pt / family disagreement with d/c plan  ___Lack of family support  ___Lack of extended family / friend support  ___Home Estate agent (multi-level home/access          issues)   ___ NONE     Inpatient Medicare/Medicare HMO Patients Only  Was an initial IMM signed within 24 hours of admission?  (Look in Media Tab, Documents Table or Shadow Chart)  ]       Uninsured Patients Only  If patient has a spouse, does your spouse have insurance under his/her place of employment? na   Did the patient sign up for insurance through the Affordable Care Act? na       COMMENTS: [place] address, she is originally from [place] and re-located to [place] to live w/son and now pt is currently living in [place].

## 2013-05-12 NOTE — Plan of Care (Signed)
Problem: Physical Therapy  Goal: Patient condition is improving per Physical Therapy Treatment Plan  Outcome: Progressing  Discharge Recommendation: Home with supervision;Home with home health PT (vs SNF)       Treatment/Interventions: Gait training;Functional transfer training;Endurance training;Bed mobility  PT Frequency: 4-5x/wk     Early/Progressive Mobility Protocol Level: Step 6: Ambulate in Room (with assistance)  (Please See Therapy Evaluation for device and assistance level needed)    Goals:   Goals  Goal Formulation: With patient  Time for Goal Acheivement: By time of discharge  Goals: Select goal  Pt Will Go Supine To Sit: Independently;Partly met  Pt Will Perform Sit to Stand: Independently;Partly met  Pt Will Ambulate: 51-100 feet;with standard walker;Independently;Partly met  Pt Will Go Up / Down Stairs: 3-5 stairs;With stand by assist;Not met

## 2013-05-13 LAB — COMPREHENSIVE METABOLIC PANEL
ALT: 20 U/L (ref 0–55)
AST (SGOT): 14 U/L (ref 5–34)
Albumin/Globulin Ratio: 0.9 (ref 0.9–2.2)
Albumin: 2.9 g/dL — ABNORMAL LOW (ref 3.5–5.0)
Alkaline Phosphatase: 69 U/L (ref 37–106)
Anion Gap: 12 (ref 5.0–15.0)
BUN: 43 mg/dL — ABNORMAL HIGH (ref 7.0–19.0)
Bilirubin, Total: 0.5 mg/dL (ref 0.2–1.2)
CO2: 20 mEq/L — ABNORMAL LOW (ref 22–29)
Calcium: 9 mg/dL (ref 7.9–10.2)
Chloride: 107 mEq/L (ref 100–111)
Creatinine: 1.9 mg/dL — ABNORMAL HIGH (ref 0.6–1.0)
Globulin: 3.3 g/dL (ref 2.0–3.6)
Glucose: 191 mg/dL — ABNORMAL HIGH (ref 70–100)
Potassium: 4.3 mEq/L (ref 3.5–5.1)
Protein, Total: 6.2 g/dL (ref 6.0–8.3)
Sodium: 139 mEq/L (ref 136–145)

## 2013-05-13 LAB — CBC
Hematocrit: 32.8 % — ABNORMAL LOW (ref 37.0–47.0)
Hgb: 10.4 g/dL — ABNORMAL LOW (ref 12.0–16.0)
MCH: 27.7 pg — ABNORMAL LOW (ref 28.0–32.0)
MCHC: 31.7 g/dL — ABNORMAL LOW (ref 32.0–36.0)
MCV: 87.2 fL (ref 80.0–100.0)
MPV: 11.9 fL (ref 9.4–12.3)
Nucleated RBC: 0 /100 WBC (ref 0–1)
Platelets: 214 10*3/uL (ref 140–400)
RBC: 3.76 10*6/uL — ABNORMAL LOW (ref 4.20–5.40)
RDW: 15 % (ref 12–15)
WBC: 11.1 10*3/uL — ABNORMAL HIGH (ref 3.50–10.80)

## 2013-05-13 LAB — GLUCOSE WHOLE BLOOD - POCT
Whole Blood Glucose POCT: 188 mg/dL — ABNORMAL HIGH (ref 70–100)
Whole Blood Glucose POCT: 153 mg/dL — ABNORMAL HIGH (ref 70–100)
Whole Blood Glucose POCT: 172 mg/dL — ABNORMAL HIGH (ref 70–100)
Whole Blood Glucose POCT: 252 mg/dL — ABNORMAL HIGH (ref 70–100)

## 2013-05-13 LAB — HEMOLYSIS INDEX: Hemolysis Index: 6 (ref 0–18)

## 2013-05-13 LAB — PHOSPHORUS: Phosphorus: 3.6 mg/dL (ref 2.3–4.7)

## 2013-05-13 LAB — MAGNESIUM: Magnesium: 1.9 mg/dL (ref 1.6–2.6)

## 2013-05-13 LAB — GFR: EGFR: 31.2

## 2013-05-13 MED ORDER — LABETALOL HCL 200 MG PO TABS
200.0000 mg | ORAL_TABLET | Freq: Three times a day (TID) | ORAL | Status: DC
Start: 2013-05-13 — End: 2013-05-14
  Administered 2013-05-13 – 2013-05-14 (×4): 200 mg via ORAL
  Filled 2013-05-13 (×4): qty 1

## 2013-05-13 NOTE — Progress Notes (Signed)
[facility] PROGRESS NOTE    Date Time: 05/13/2013 7:21 AM  Patient Name: Shelby Barron  Attending Physician: [name], MD    CC: crf    Assessment:                                  1. CKD stage 4: Currently at baseline  2. HTN: Running high  3. Gout flare  4. Hamstring injury on the right. Ortho. Is managing.    Recommendations:     1) Increase labetalol to TID    Case discussed with: Patient & nurse  Alla German, MD  [facility]  [phone] (office)  [phone] (FFX Spectra-Link)      Subjective: Continues to improve - anxious for discharge to [facility]    Review of Systems:   Review of Systems - Negative except some pain in hip      Physical Exam:     Filed Vitals:    05/12/13 0728 05/12/13 1535 05/12/13 1900 05/13/13 0639   BP: 165/68 161/69 165/72 166/72   Pulse: 71 75 73 71   Temp: 96.6 F (35.9 C) 96.3 F (35.7 C) 96.8 F (36 C) 97.2 F (36.2 C)   TempSrc: Oral Oral Oral Oral   Resp: 16 16 18 16    Height:       Weight:       SpO2: 96% 96% 98% 98%       Intake and Output Summary (Last 24 hours) at Date Time    Intake/Output Summary (Last 24 hours) at 05/13/13 0721  Last data filed at 05/12/13 1700   Gross per 24 hour   Intake    800 ml   Output      0 ml   Net    800 ml       General: awake, alert, oriented x 3, no acute distress.  Cardiovascular: regular rate and rhythm, no murmurs, rubs or gallops  Lungs: clear to auscultation bilaterally, without wheezing, rhonchi, or rales  Abdomen: soft, non-tender, non-distended, normoactive bowel sounds, no palpable masses, no hepatosplenomegaly, no rebound or guarding  Extremities: no clubbing, cyanosis, or edema      Meds:      Scheduled Meds: PRN Meds:           amLODIPine 10 mg Oral Daily   [COMPLETED] bisacodyl 10 mg Oral Once   clopidogrel 75 mg Oral Daily   enoxaparin 30 mg Subcutaneous Daily   fluticasone-salmeterol 2 puff Inhalation BID   furosemide 20 mg Oral BID   insulin glargine 20 Units Subcutaneous Q12H    labetalol 200 mg Oral Q12H SCH   lisinopril 5 mg Oral BID   methylprednisolone 4 mg Oral QD after lunch   methylprednisolone 4 mg Oral QD after dinner   methylprednisolone 4 mg Oral QAM AC   methylprednisolone 4 mg Oral QHS   [COMPLETED] methylprednisolone 8 mg Oral QHS   pantoprazole 40 mg Oral QAM AC   pregabalin 50 mg Oral Daily   sodium bicarbonate 650 mg Oral BID         Continuous Infusions:         albuterol 2.5 mg Q4H WA PRN   dextrose 15 g of glucose PRN   dextrose 25 mL PRN   docusate sodium 100 mg QD PRN   glucagon (rDNA) 1 mg PRN   HYDROmorphone 1 mg Q4H PRN  insulin aspart 1-5 Units TID AC PRN   naloxone 0.2 mg PRN   ondansetron 4 mg Q4H PRN   oxyCODONE-acetaminophen 1 tablet Q6H PRN   zolpidem 5 mg QHS PRN             Labs:       Lab 05/13/13 0433 05/12/13 0500 05/09/13 2315   WBC 11.10* 9.41 11.69*   HGB 10.4* 10.2* 12.6   HCT 32.8* 31.3* 38.6   PLT 214 182 220       Lab 05/13/13 0434 05/12/13 0500 05/10/13 1721 05/07/13 1225   NA 139 137 137 --   K 4.3 4.2 4.5 --   CL 107 108 106 --   CO2 20* 20* 19* --   BUN 43.0* 39.0* 35.0* --   CREAT 1.9* 2.0* 2.2* --   CA 9.0 8.9 9.4 --   ALB 2.9* 2.7* -- 3.4*   PHOS 3.6 2.9 -- 3.8   MG 1.9 1.8 -- --   GLU 191* 293* 147* --   EGFR 31.2 29.4 26.4 --         Lab 05/10/13 1721   URINETYPE Clean Catch   COLORUA Yellow   CLARITYUA Clear   SPECGRAVUA 1.015   URINEPH 5.0   NITRITEUA Negative   PROTEINUA --   GLUUA --   KETONESUA Negative   UROBILIUA Negative   BILIUA Negative   BLOODUA Negative   RBCUA 0 - 5   WBCUA 0 - 5   URINEBACTERI Rare   GRANCASTUA --       Lab 05/07/13 1225   URINECREATIN --   PROTEINUA --   URINEPROTEIN --   MICROALBUMIN --   NAUR --   CREATUR 192.3   OSMOLUR --       Imaging personally reviewed, including: No results found.        Signed by: Alla German, MD

## 2013-05-13 NOTE — Plan of Care (Signed)
Problem: Pain  Goal: Patient's pain/discomfort is manageable  Outcome: Progressing  Patient only required PRN pain medication x 1 this shift.    Comments:   Patient alert and oriented x 3.  Patient hypertensive and hyperglycemic.  Other vital signs WNL.  Patient is very pleasant and talkative.  Showered today with help.  Bed is in the lowest position with side rails x 3.  Call light, side table, and personal belongings within reach.  Hourly rounding explained and performed.

## 2013-05-13 NOTE — Progress Notes (Signed)
Date Time: 05/13/2013 9:06 AM  Patient Name: Shelby Barron  Attending Physician: [name], MD        Assessment:                                  1. Pain in the right toe better  2.. HTN: uncontrolled  3. Gout flare  4. Hamstring injury on the right.  5.difficulty in bearing wt    Recommendations:     Continue prednisone for gout on tapering dose  Pain meds   Increased labetolol to TID  Will be transferred to rehab  Patient will be d/c to [facility] today      Case discussed with: Patient      Subjective: severe pain in the right  toe unable to bear wt and walk     Review of Systems:   Review of Systems - Respiratory ROS: no cough, shortness of breath, or wheezing  Cardiovascular ROS: no chest pain or dyspnea on exertion  Musculoskeletal ROS: Leg pain as described above      Physical Exam:     Filed Vitals:    05/12/13 0728 05/12/13 1535 05/12/13 1900 05/13/13 0639   BP: 165/68 161/69 165/72 166/72   Pulse: 71 75 73 71   Temp: 96.6 F (35.9 C) 96.3 F (35.7 C) 96.8 F (36 C) 97.2 F (36.2 C)   TempSrc: Oral Oral Oral Oral   Resp: 16 16 18 16    Height:       Weight:       SpO2: 96% 96% 98% 98%       Intake and Output Summary (Last 24 hours) at Date Time    Intake/Output Summary (Last 24 hours) at 05/13/13 0906  Last data filed at 05/12/13 1700   Gross per 24 hour   Intake    800 ml   Output      0 ml   Net    800 ml       General: awake, alert, oriented x 3, no acute distress.  Cardiovascular: regular rate and rhythm, no murmurs, rubs or gallops  Lungs: clear to auscultation bilaterally, without wheezing, rhonchi, or rales  Abdomen: soft, non-tender, non-distended, normoactive bowel sounds, no palpable masses, no hepatosplenomegaly, no rebound or guarding  Extremities: no clubbing, cyanosis, or edema tenderness at the right  toe       Meds:      Scheduled Meds: PRN Meds:           amLODIPine 10 mg Oral Daily   [COMPLETED] bisacodyl 10 mg Oral Once   clopidogrel 75 mg Oral Daily   enoxaparin 30  mg Subcutaneous Daily   fluticasone-salmeterol 2 puff Inhalation BID   furosemide 20 mg Oral BID   insulin glargine 20 Units Subcutaneous Q12H   labetalol 200 mg Oral TID   methylprednisolone 4 mg Oral QD after lunch   methylprednisolone 4 mg Oral QD after dinner   methylprednisolone 4 mg Oral QAM AC   methylprednisolone 4 mg Oral QHS   [COMPLETED] methylprednisolone 8 mg Oral QHS   pantoprazole 40 mg Oral QAM AC   pregabalin 50 mg Oral Daily   sodium bicarbonate 650 mg Oral BID   [DISCONTINUED] labetalol 200 mg Oral Q12H [facility]   [DISCONTINUED] lisinopril 5 mg Oral BID         Continuous Infusions:         albuterol  2.5 mg Q4H WA PRN   dextrose 15 g of glucose PRN   dextrose 25 mL PRN   docusate sodium 100 mg QD PRN   glucagon (rDNA) 1 mg PRN   HYDROmorphone 1 mg Q4H PRN   insulin aspart 1-5 Units TID AC PRN   naloxone 0.2 mg PRN   ondansetron 4 mg Q4H PRN   oxyCODONE-acetaminophen 1 tablet Q6H PRN   zolpidem 5 mg QHS PRN             Labs:       Lab 05/13/13 0433 05/12/13 0500 05/09/13 2315   WBC 11.10* 9.41 11.69*   HGB 10.4* 10.2* 12.6   HCT 32.8* 31.3* 38.6   PLT 214 182 220       Lab 05/13/13 0434 05/12/13 0500 05/10/13 1721 05/07/13 1225   NA 139 137 137 --   K 4.3 4.2 4.5 --   CL 107 108 106 --   CO2 20* 20* 19* --   BUN 43.0* 39.0* 35.0* --   CREAT 1.9* 2.0* 2.2* --   CA 9.0 8.9 9.4 --   ALB 2.9* 2.7* -- 3.4*   PHOS 3.6 2.9 -- 3.8   MG 1.9 1.8 -- --   GLU 191* 293* 147* --   EGFR 31.2 29.4 26.4 --         Lab 05/10/13 1721   URINETYPE Clean Catch   COLORUA Yellow   CLARITYUA Clear   SPECGRAVUA 1.015   URINEPH 5.0   NITRITEUA Negative   PROTEINUA --   GLUUA --   KETONESUA Negative   UROBILIUA Negative   BILIUA Negative   BLOODUA Negative   RBCUA 0 - 5   WBCUA 0 - 5   URINEBACTERI Rare   GRANCASTUA --       Lab 05/07/13 1225   URINECREATIN --   PROTEINUA --   URINEPROTEIN --   MICROALBUMIN --   NAUR --   CREATUR 192.3   OSMOLUR --       Imaging personally reviewed, including: No results found.        Signed by:  Cathleen Fears, MD

## 2013-05-13 NOTE — Progress Notes (Signed)
Writer spoke w/ daughter who agreed to complete the application to [employer] today. Time is undetermined as to when family will arrive. Left a message for facility requesting a return call back.

## 2013-05-13 NOTE — Plan of Care (Signed)
Problem: Safety  Goal: Patient will be free from injury during hospitalization  Outcome: Progressing  Pt. One person for ADL, walks to the BR via walker and one person assist, unsteady gait, fall precaution in place, assisted as needed, pt. instructed to call for assist when needed, verbalized understanding, intentional hourly rounding maintained.     Problem: Endocrine Abnormality  Goal: IHS BLOOD GLUCOSE STABLE AT ESTABLISHED GOAL (FENE)  Outcome: Progressing  BS 224 mg/dl at HS, coverage given per sliding scale and scheduled lantus given, snack provided and pt. Been monitered for s&s of hypo/hyperglycemia, will cont. To monitor.     Comments:   Pt. Denies pain or any discomfort during shift, will monitor.

## 2013-05-14 DIAGNOSIS — Q245 Malformation of coronary vessels: Secondary | ICD-10-CM | POA: Diagnosis not present

## 2013-05-14 DIAGNOSIS — I10 Essential (primary) hypertension: Secondary | ICD-10-CM | POA: Diagnosis not present

## 2013-05-14 DIAGNOSIS — M79609 Pain in unspecified limb: Secondary | ICD-10-CM | POA: Diagnosis not present

## 2013-05-14 DIAGNOSIS — D7289 Other specified disorders of white blood cells: Secondary | ICD-10-CM | POA: Diagnosis not present

## 2013-05-14 DIAGNOSIS — M25579 Pain in unspecified ankle and joints of unspecified foot: Secondary | ICD-10-CM | POA: Diagnosis not present

## 2013-05-14 DIAGNOSIS — IMO0002 Reserved for concepts with insufficient information to code with codable children: Secondary | ICD-10-CM | POA: Diagnosis not present

## 2013-05-14 DIAGNOSIS — M25569 Pain in unspecified knee: Secondary | ICD-10-CM | POA: Diagnosis not present

## 2013-05-14 DIAGNOSIS — R609 Edema, unspecified: Secondary | ICD-10-CM | POA: Diagnosis not present

## 2013-05-14 DIAGNOSIS — R300 Dysuria: Secondary | ICD-10-CM | POA: Diagnosis not present

## 2013-05-14 DIAGNOSIS — M109 Gout, unspecified: Secondary | ICD-10-CM | POA: Diagnosis not present

## 2013-05-14 DIAGNOSIS — I129 Hypertensive chronic kidney disease with stage 1 through stage 4 chronic kidney disease, or unspecified chronic kidney disease: Secondary | ICD-10-CM | POA: Diagnosis not present

## 2013-05-14 DIAGNOSIS — E1129 Type 2 diabetes mellitus with other diabetic kidney complication: Secondary | ICD-10-CM | POA: Diagnosis not present

## 2013-05-14 DIAGNOSIS — N183 Chronic kidney disease, stage 3 unspecified: Secondary | ICD-10-CM | POA: Diagnosis not present

## 2013-05-14 DIAGNOSIS — I69959 Hemiplegia and hemiparesis following unspecified cerebrovascular disease affecting unspecified side: Secondary | ICD-10-CM | POA: Diagnosis not present

## 2013-05-14 DIAGNOSIS — R262 Difficulty in walking, not elsewhere classified: Secondary | ICD-10-CM | POA: Diagnosis not present

## 2013-05-14 DIAGNOSIS — N179 Acute kidney failure, unspecified: Secondary | ICD-10-CM | POA: Diagnosis not present

## 2013-05-14 DIAGNOSIS — M19079 Primary osteoarthritis, unspecified ankle and foot: Secondary | ICD-10-CM | POA: Diagnosis not present

## 2013-05-14 DIAGNOSIS — M25559 Pain in unspecified hip: Secondary | ICD-10-CM | POA: Diagnosis not present

## 2013-05-14 DIAGNOSIS — M6281 Muscle weakness (generalized): Secondary | ICD-10-CM | POA: Diagnosis not present

## 2013-05-14 DIAGNOSIS — G609 Hereditary and idiopathic neuropathy, unspecified: Secondary | ICD-10-CM | POA: Diagnosis not present

## 2013-05-14 DIAGNOSIS — K219 Gastro-esophageal reflux disease without esophagitis: Secondary | ICD-10-CM | POA: Diagnosis not present

## 2013-05-14 LAB — BASIC METABOLIC PANEL
Anion Gap: 9 (ref 5.0–15.0)
BUN: 43 mg/dL — ABNORMAL HIGH (ref 7.0–19.0)
CO2: 22 mEq/L (ref 22–29)
Calcium: 9.1 mg/dL (ref 7.9–10.2)
Chloride: 108 mEq/L (ref 100–111)
Creatinine: 1.8 mg/dL — ABNORMAL HIGH (ref 0.6–1.0)
Glucose: 136 mg/dL — ABNORMAL HIGH (ref 70–100)
Potassium: 4.7 mEq/L (ref 3.5–5.1)
Sodium: 139 mEq/L (ref 136–145)

## 2013-05-14 LAB — CBC
Hematocrit: 31.5 % — ABNORMAL LOW (ref 37.0–47.0)
Hgb: 10.1 g/dL — ABNORMAL LOW (ref 12.0–16.0)
MCH: 27.8 pg — ABNORMAL LOW (ref 28.0–32.0)
MCHC: 32.1 g/dL (ref 32.0–36.0)
MCV: 86.8 fL (ref 80.0–100.0)
MPV: 11.6 fL (ref 9.4–12.3)
Nucleated RBC: 0 /100 WBC (ref 0–1)
Platelets: 238 10*3/uL (ref 140–400)
RBC: 3.63 10*6/uL — ABNORMAL LOW (ref 4.20–5.40)
RDW: 15 % (ref 12–15)
WBC: 9.76 10*3/uL (ref 3.50–10.80)

## 2013-05-14 LAB — GFR: EGFR: 33.3

## 2013-05-14 LAB — GLUCOSE WHOLE BLOOD - POCT
Whole Blood Glucose POCT: 107 mg/dL — ABNORMAL HIGH (ref 70–100)
Whole Blood Glucose POCT: 132 mg/dL — ABNORMAL HIGH (ref 70–100)

## 2013-05-14 LAB — HEMOLYSIS INDEX: Hemolysis Index: 1 (ref 0–18)

## 2013-05-14 MED ORDER — OXYCODONE-ACETAMINOPHEN 5-325 MG PO TABS
1.0000 | ORAL_TABLET | Freq: Four times a day (QID) | ORAL | Status: DC | PRN
Start: 2013-05-14 — End: 2014-12-17

## 2013-05-14 MED ORDER — LABETALOL HCL 200 MG PO TABS
200.0000 mg | ORAL_TABLET | Freq: Three times a day (TID) | ORAL | Status: DC
Start: 2013-05-14 — End: 2014-12-17

## 2013-05-14 NOTE — PT Progress Note (Signed)
Physical Therapy Note    [facility]  Physical Therapy Treatment    Patient:  SANYIAH KANZLER MRN#:  [id number]  Unit:  [facility] MEDICAL ONCOLOGY RENAL Room/Bed:  A2114/A2114-01    Time of treatment: Time Calculation  PT Received On: 05/14/13  Start Time: 1435  Stop Time: 1500  Time Calculation (min): 25 min           Visit # 6     Precautions:   Precautions  Weight Bearing Status: no restrictions  Other Precautions: Falls    Updated X-Rays/Tests/Labs:  Lab Results   Component Value Date/Time    HGB 10.1* 05/14/2013  4:18 AM    HCT 31.5* 05/14/2013  4:18 AM    K 4.7 05/14/2013  4:18 AM    NA 139 05/14/2013  4:18 AM         Subjective: "I am ok"       Pain Assessment  Pain Assessment: Numeric Scale (0-10)  Pain Score: 5-moderate pain  POSS Score: Awake and Alert  Pain Location: Leg;Foot  Pain Orientation: Right  Pain Descriptors: Aching  Pain Frequency: Continuous  Pain Intervention(s): Medication (See eMAR)    Patient's medical condition is appropriate for Physical Therapy intervention at this time.  Patient is agreeable to participation in the therapy session. Nursing clears patient for therapy.    Objective:  Observation of Patient/Vital Signs: VSS t/o    Patient received in bed with No Medical Equipment in place.    Cognition  Arousal/Alertness: Appropriate responses to stimuli  Attention Span: Appears intact  Orientation Level: Oriented X4  Memory: Appears intact  Following Commands: Follows all commands and directions without difficulty  Safety Awareness: minimal verbal instruction  Insights: Fully aware of deficits;Educated in safety awareness  Problem Solving: Able to problem solve independently           Functional Mobility  Supine to Sit: Stand by assistance  Scooting to EOB: Independent  Sit to Stand: Stand by assistance  Stand to Sit: Stand by assistance  Transfers  Bed to Chair: Stand by assistance  Locomotion  Ambulation: with standard walker  Ambulation Distance (Feet): 100  Feet  Pattern: R decreased stance time;decreased step length;decreased cadence;Step through  Stair Management: unable to perform (comment) (pt refused 2/2 increased pain with ambulation)                      Educated the patient to role of physical therapy, plan of care, goals of therapy and HEP, safety with mobility and ADLs, energy conservation techniques, pursed lip breathing, home safety.  Patient left in bedside chair with call bell within reach. RN notified of session outcome.      Assessment: pt helped to use bathroom for urination , pt able to mange personal hygiene and clothing management independently. Pt able to ambulate in hallway, c/o increased pain and discomfort during ambulation. Instructed on PLB for energy conservation.pt is limited by decrease in strength and endurence, Pt  would benefit from PT to improve functional mobility.      Plan: Treatment/Interventions: Gait training;Functional transfer training;Endurance training;Bed mobility      PT Frequency: 4-5x/wk   Continue plan of care. Increase ambulation as able to to improve endurence for functional mobility.    Goals: Goals  Goal Formulation: With patient  Time for Goal Acheivement: By time of discharge  Goals: Select goal  Pt Will Go Supine To Sit: Independently;Partly met  Pt Will Perform Sit  to Stand: Independently;Partly met  Pt Will Ambulate: 51-100 feet;with standard walker;Independently;Partly met  Pt Will Go Up / Down Stairs: 3-5 stairs;With stand by assist;Not met           Discharge Recommendation: Home with supervision;Home with home health PT    Alona Bene  [id number]    [facility] PM&R    05/14/2013  4:09 PM

## 2013-05-14 NOTE — Discharge Summary (Signed)
Discharge Date:      ATTENDING PHYSICIAN:  Jackquline Berlin, MD     DATE OF ADMISSION:  May 10, 2013.     DATE OF DISCHARGE:  May 11, 2013.     HISTORY OF PRESENT ILLNESS:  The patient is a 73 year old female with a history of renal cell carcinoma,  status post partial left nephrectomy, chronic kidney disease, neuropathy,  hyperlipidemia, prior history of cerebrovascular accident, hypertension,  and gout, along with diabetes, admitted to [facility] with a fall,  sustaining severe right leg pain.  Workup in the emergency room was  unremarkable, and she was diagnosed with a right hamstring injury and  admitted for pain control, IV pain medication.  During her stay, she was  seen by orthopedics, and the conclusion was a right medial hamstring muscle  strain, with no other injury.  Recommended weightbearing as tolerated.     Hospital stay was complicated with worsening of her gout symptoms, severe  pain in the right foot, and she was started on IV steroids and colchicine,  along with pain control.  If she is stable, she will be able to be  discharged later today or in the morning, if she is able to ambulate and  her pain is controlled.     DISCHARGE DIAGNOSES:  1.  Fall, right hamstring sprain/strain; no other injury:  Weightbearing as  tolerated.    2.  Acute gout flare:  On medication.  3.  Renal insufficiency, with creatinine around about 2 to 2.2.  4.  Hypertension.     DISCHARGE MEDICATIONS:  Per medication reconciliation.           D:  05/11/2013 14:07 PM by Dr. Jordan Likes A. Eston Mould, MD [phone])  T:  05/11/2013 20:58 PM by       Everlean Cherry: [id number]) (Doc ID: [id number])    Add : 05/14/13 : Doing much better, alert, still with right leg right foot pain.  Stable vital signs, labs and review with stable renal insufficiency, normal WBC, and stable hematocrit.  It was December 8 with a walker but the plan for discharge to skilled nursing facility, hopefully today.  Rest of physical exam is unchanged.  Her right foot  gout flare improved.  Plan for discharge later today if bed is available at skilled nursing facility .

## 2013-05-14 NOTE — Progress Notes (Signed)
Patient alert and oriented x 3 with VS WNL.  Discharged to [facility].  Patient left in a wheel chair and transported by the wheel chair van.  Family members accompanied her.  Appropriate documents signed and patient acknowledged comprehension of the discharge instructions.  IV removed with catheter intact.  Personal belongings returned.  Report called into [phone].  Thank you card handed to patient.

## 2013-05-14 NOTE — Plan of Care (Signed)
Adequate for discharge.

## 2013-05-14 NOTE — Progress Notes (Signed)
Case Management Facility [place] Checklist    Name of Receiving Facility and Bed #:  Receiving Facility/Home Health/Hospice/Agency: [facility] Room EV220 Report [phone]      Number for Floor RN to Call Report: Contact Information Provided: Yes     Name and number of discharge nurse and time nurse notified:    Family Member notified of transfer plan: Patient/Family/POA notified of transfer plan: Yes     Fax number of Receiving Facility:    Hard Copy of any narcotic prescriptions in envelope to travel with the patient: Hard copy of narcotic RX sent with patient?: N/A     Hard Copy of Durable DNR and/or advanced directive in envelope to travel with the patient: Hard copy of DNR/Advance Directive sent with patient?: N/A     Phone number and name of attending physician at receiving facility (MD to MD handoff):            Transportation  Mode and Time of Transportation: Mode of Transportation: Wheelchair [place]         SNF Only  I have completed the Medicaid Pre- Screening ([place], DMAS 96 & 97) and faxed it via ECIN/Allscripts to the receiving facility. Medicaid Pre-Screening completed: No     I have completed the DMAS 95 - MI/MR form and faxed it via ECIN/Allscripts to the receiving facility. DMAS 95 [place]/MR completed and faxed: No     Does the DMAS 95 - MI/MR trigger a Level 2 screening? DMAS 95 MI/MR trigger Level 2 Screening?: No       Medicare Only  I have validated that this patient has a 3 midnight inpatient qualifying stay (SNF only). 3 midnight inpatient qualifying stay (SNF only): Yes     I have validated that the patient has received the second Important Message from Medicare (IMM) letter. If LOS 3 days or greater, did patient received 2nd IMM Letter?: Yes       The following was routed via Epic to the Receiving Facility:  Document Routed via Epic on Day of Discharge?   History & Physical    Discharge Medication Reconciliation (.DCMEDLIST in note)    D/C order    Last day of PT/OT notes (if applicable)    D/C  Summary    PICC Line Report (if applicable)    Most Recent Chest Xray    All MD Consults    Isolation Requirement (go to isolation order in other orders section of chart review)    Urine/Wound/Blood Culture Results     Discharge Instructions from AVS (use Pasteboard option to copy & paste from AVS into a progress note)    Tube Feeding or TPN Order (rate & formula) if applicable    Last 3 days of MD Progress Notes

## 2013-05-14 NOTE — Progress Notes (Signed)
[facility] PROGRESS NOTE    Date Time: 05/14/2013 9:57 AM  Patient Name: [name]  Attending Physician: [name], MD    CC: crf    Assessment:                                  1. CKD stage 4: Currently at baseline  2. HTN: still high- steroids and pain in R foot contributing  3. Gout flare  4. Hamstring injury on the right. Ortho. Is managing.    Recommendations:     1) Increase labetalol to 400 BID  2. Taper steroids  3. Stable for d/c planning from my standpoint. Ff-up with Dr [name] in our office in 2 weeks.    Case discussed with: Patient Danie Chandler, MD  [facility]  [phone] (office)  X [phone] [facility] Spectra-Link)      Subjective:  Feels better; concerned about HTN    Review of Systems:   Review of Systems - Negative except some pain in hip      Physical Exam:     Filed Vitals:    05/13/13 0639 05/13/13 1508 05/13/13 2206 05/14/13 0712   BP: 166/72 148/67 178/75 176/79   Pulse: 71 65 68 70   Temp: 97.2 F (36.2 C) 96.8 F (36 C) 98 F (36.7 C) 97.9 F (36.6 C)   TempSrc: Oral Oral Oral    Resp: 16 16 18 18    Height:       Weight:       SpO2: 98% 98% 100% 95%       Intake and Output Summary (Last 24 hours) at Date Time    Intake/Output Summary (Last 24 hours) at 05/14/13 0957  Last data filed at 05/14/13 0555   Gross per 24 hour   Intake    950 ml   Output     10 ml   Net    940 ml       General: awake, alert, oriented x 3, no acute distress.  Cardiovascular: regular rate and rhythm, no murmurs, rubs or gallops  Lungs: clear to auscultation bilaterally, without wheezing, rhonchi, or rales  Abdomen: soft, non-tender, non-distended, normoactive bowel sounds, no palpable masses, no hepatosplenomegaly, no rebound or guarding  Extremities: no clubbing, cyanosis, or edema      Meds:      Scheduled Meds: PRN Meds:           amLODIPine 10 mg Oral Daily   clopidogrel 75 mg Oral Daily   enoxaparin 30 mg Subcutaneous Daily   fluticasone-salmeterol 2 puff Inhalation BID    furosemide 20 mg Oral BID   insulin glargine 20 Units Subcutaneous Q12H   labetalol 200 mg Oral TID   methylprednisolone 4 mg Oral QD after lunch   [COMPLETED] methylprednisolone 4 mg Oral QD after dinner   methylprednisolone 4 mg Oral QAM AC   methylprednisolone 4 mg Oral QHS   pantoprazole 40 mg Oral QAM AC   pregabalin 50 mg Oral Daily   sodium bicarbonate 650 mg Oral BID         Continuous Infusions:         albuterol 2.5 mg Q4H WA PRN   dextrose 15 g of glucose PRN   dextrose 25 mL PRN   docusate sodium 100 mg QD PRN   glucagon (rDNA) 1 mg PRN   HYDROmorphone 1 mg Q4H PRN  insulin aspart 1-5 Units TID AC PRN   naloxone 0.2 mg PRN   ondansetron 4 mg Q4H PRN   oxyCODONE-acetaminophen 1 tablet Q6H PRN   zolpidem 5 mg QHS PRN             Labs:       Lab 05/14/13 0418 05/13/13 0433 05/12/13 0500   WBC 9.76 11.10* 9.41   HGB 10.1* 10.4* 10.2*   HCT 31.5* 32.8* 31.3*   PLT 238 214 182       Lab 05/14/13 0418 05/13/13 0434 05/12/13 0500 05/07/13 1225   NA 139 139 137 --   K 4.7 4.3 4.2 --   CL 108 107 108 --   CO2 22 20* 20* --   BUN 43.0* 43.0* 39.0* --   CREAT 1.8* 1.9* 2.0* --   CA 9.1 9.0 8.9 --   ALB -- 2.9* 2.7* 3.4*   PHOS -- 3.6 2.9 3.8   MG -- 1.9 1.8 --   GLU 136* 191* 293* --   EGFR 33.3 31.2 29.4 --         Lab 05/10/13 1721   URINETYPE Clean Catch   COLORUA Yellow   CLARITYUA Clear   SPECGRAVUA 1.015   URINEPH 5.0   NITRITEUA Negative   PROTEINUA --   GLUUA --   KETONESUA Negative   UROBILIUA Negative   BILIUA Negative   BLOODUA Negative   RBCUA 0 - 5   WBCUA 0 - 5   URINEBACTERI Rare   GRANCASTUA --       Lab 05/07/13 1225   URINECREATIN --   PROTEINUA --   URINEPROTEIN --   MICROALBUMIN --   NAUR --   CREATUR 192.3   OSMOLUR --       Imaging personally reviewed, including: No results found.        Signed by: Danie Chandler, MD

## 2013-05-14 NOTE — Plan of Care (Signed)
Problem: Safety  Goal: Patient will be free from injury during hospitalization  Outcome: Progressing  Pt. Will remain free from fall during this hospitalization, fall percaution in place, assisted to the commode, pt. instructed to call for  Assist when needed, call bell within reach.    Problem: Pain  Goal: Patient's pain/discomfort is manageable  Intervention: Include patient/family/caregiver in decisions related to pain management  Pt. C/O back pain and headache, PRN dilaudid 1mg  IV given with relief. BS 252 mg/dl, coverage given per sliding scale, snack provided, pt been monitored for s&s of hypo/hyperglicemia. Pt. Assisted as needed, all needs addresed, will cont.POC.

## 2013-05-14 NOTE — Discharge Instructions (Signed)
Diabetes and Kidney Disease  Diabetes makes your body less able to use the foods you eat as sources of energy. As a result, glucose (the form of sugar the body uses as fuel) builds up in the blood. Over time, having too much glucose in your blood can damage blood vessels and kidneys. By controlling diabetes, you can maintain a healthy blood glucose level and slow or prevent kidney damage.    Visit your healthcare provider as scheduled.   Follow Your Diet  To get the most energy from the foods you eat and feel your best, you may have to follow a special diet. Work closely with your healthcare team to design a meal plan that is right for you.  You May Also Need To:   Eat less protein.   Drink less fluid.   Limit sodium (salt) intake.   Eat foods that are low in phosphorus and potassium.  Take Insulin and Diabetes Medication as Directed  Insulin is a hormone that helps your body use glucose. You may give yourself insulin to increase your body's supply. Or you may take other medications to help your body release more insulin or use insulin better. The stage of your kidney disease can reduce the amount of insulin your body needs. So your insulin injections or other medication may be adjusted. Talk with your doctor if your blood glucose level is often too low. Monitor your blood glucose with a meter as directed by your doctor.  Stay Active  Exercise helps the body use glucose. For best results:   Talk with your doctor before starting a fitness program.   Ask your doctor how often you should exercise and for how long.   Your doctor may be able to suggest activities that will help you feel your best.   Eat 1-2 hours before you exercise.   [address], [address] [place], [place], [place] [address]. All rights reserved. This information is not intended as a substitute for professional medical care. Always follow your healthcare professional's instructions.      Kidney Disease: Avoiding High-Sodium  Foods  Sodium is a mineral that the body needs in small amounts. When sodium intake is too high, it can increase thirst and cause the body to retain fluid. If you have kidney disease, try not to eat the foods listed here, unless the label states that they are made without salt.     Canned and processed foods, such as gravies, instant cereal, packaged noodles and potato mixes, olives, pickles, soups, vegetables   Cheeses, such as American, Blue, Parmesan, Roquefort   Cured meats, such as bacon, beef jerky, bologna, corned beef, ham, hot dogs, sandwich meats, sausages   Fast foods, such as burritos, fish sandwiches, milk shakes, salted french fries, tacos   Frozen foods, such as meat pies, TV dinners, waffles   Salted snacks, such as chips, crackers, peanut popcorn, pretzels, and nuts   Other packaged items, such as antacids, baking soda, bouillon, catsup, lite salt, relish, salted butter and margarine, soy sauce, steak sauce, vegetable juices   [address], [address] [place], [place] [address]. All rights reserved. This information is not intended as a substitute for professional medical care. Always follow your healthcare professional's instructions.  Date Time: 05/14/2013 3:40 PM  Attending Physician: [name], MD    Date of Admission:   05/09/2013    Reason for Admission:   Renal insufficiency [593.9]  Inability to ambulate due to right hip [  719.7]  Right hamstring injury, initial encounter [959.6]  Hamstring injury  Hamstring injury      Medications:    Your medications have been listed for you on the Medication Reconciliation Discharge Home List. Please bring a copy of all discharge instructions, including your Medication Reconciliation Discharge Home List when you visit your physician.      Continue taking all medications even if you feel well, unless otherwise instructed by physician.    Do not take any over-the-counter medications or herbal supplements without checking with  your pharmacist or doctor.     Activity:    Rise slowly from a sitting or lying position. Increase activity slowly, unless otherwise instructed by physician.    Perform exercises as desginated by Therapist and Physician.    In the event of severe shortness of breath or chest discomfort, call 911. Do NOT drive to the hospital.    Speak with your physician regarding specific driving and/or work restrictions.    Diet:    If you have special diet orders, you have been given printed diet instructions.   ________________________________________________________________________    Tobacco Cessation Counseling:  If you are currently a tobacco user or have used tobacco within the last 12 months, we have provided you with written Tobacco Cessation Counseling.  ________________________________________________________________________    Heart Failure Education:  If you have a diagnosis of a Heart Failure, we have provided you with written Heart Failure Education.     Weigh yourself once a day at the same time. Record and bring the weight record to your next physician appointment.     Call your doctor if you gain more than 3 pounds in one day or 5 pounds in one week, or if you experience shortness of breath, leg swelling and/or chest discomfort.     Enroll in the [facility] Tel-Assurance Program, a heart failure patient support program. Call [phone] for additional details.   ________________________________________________________________________    Diamond Nickel Education:    Call 911 for:    Sudden numbness or weakness of the face    Sudden numbness of the arm or leg especially one side of the body    Sudden confusion, trouble speaking or understanding    Sudden trouble seeing in one or both eyes    Sudden trouble walking or dizziness, loss of balance or coordination        For promotion of your health, we have provided you with personalized written education on risk factors specific to your diagnosis, including but not  limited to:    High Blood Pressure    High Cholesterol    Atrial Fibrillation    Overweight    Diabetes    Smoking           Signed by: Thurmon Fair, RN    I HAVE RECEIVED AND UNDERSTAND THESE DISCHARGE INSTRUCTIONS.

## 2013-05-14 NOTE — Plan of Care (Addendum)
Problem: Physical Therapy  Goal: Patient condition is improving per Physical Therapy Treatment Plan  Outcome: Progressing  Discharge Recommendation: Home with supervision;Home with home health PT       Treatment/Interventions: Gait training;Functional transfer training;Endurance training;Bed mobility  PT Frequency: 4-5x/wk     Early/Progressive Mobility Protocol Level: Step 7: Ambulate in Hallway (with assistance) with Standard Walker.  (Please See Therapy Evaluation for device and assistance level needed)    Goals:   Goals  Goal Formulation: With patient  Time for Goal Acheivement: By time of discharge  Goals: Select goal  Pt Will Go Supine To Sit: Independently;Partly met  Pt Will Perform Sit to Stand: Independently;Partly met  Pt Will Ambulate: 51-100 feet;with standard walker;Independently;Partly met  Pt Will Go Up / Down Stairs: 3-5 stairs;With stand by assist;Not met      Alona Bene  [id number]    [facility] PM&R    05/14/2013  4:10 PM

## 2013-05-15 DIAGNOSIS — I129 Hypertensive chronic kidney disease with stage 1 through stage 4 chronic kidney disease, or unspecified chronic kidney disease: Secondary | ICD-10-CM | POA: Diagnosis not present

## 2013-05-15 DIAGNOSIS — M25569 Pain in unspecified knee: Secondary | ICD-10-CM | POA: Diagnosis not present

## 2013-05-15 DIAGNOSIS — M109 Gout, unspecified: Secondary | ICD-10-CM | POA: Diagnosis not present

## 2013-05-15 DIAGNOSIS — E1129 Type 2 diabetes mellitus with other diabetic kidney complication: Secondary | ICD-10-CM | POA: Diagnosis not present

## 2013-05-16 DIAGNOSIS — I129 Hypertensive chronic kidney disease with stage 1 through stage 4 chronic kidney disease, or unspecified chronic kidney disease: Secondary | ICD-10-CM | POA: Diagnosis not present

## 2013-05-16 DIAGNOSIS — E1129 Type 2 diabetes mellitus with other diabetic kidney complication: Secondary | ICD-10-CM | POA: Diagnosis not present

## 2013-05-16 DIAGNOSIS — M25569 Pain in unspecified knee: Secondary | ICD-10-CM | POA: Diagnosis not present

## 2013-05-22 DIAGNOSIS — M25569 Pain in unspecified knee: Secondary | ICD-10-CM | POA: Diagnosis not present

## 2013-05-22 DIAGNOSIS — E1129 Type 2 diabetes mellitus with other diabetic kidney complication: Secondary | ICD-10-CM | POA: Diagnosis not present

## 2013-05-22 DIAGNOSIS — M109 Gout, unspecified: Secondary | ICD-10-CM | POA: Diagnosis not present

## 2013-05-22 DIAGNOSIS — I129 Hypertensive chronic kidney disease with stage 1 through stage 4 chronic kidney disease, or unspecified chronic kidney disease: Secondary | ICD-10-CM | POA: Diagnosis not present

## 2013-05-23 DIAGNOSIS — M109 Gout, unspecified: Secondary | ICD-10-CM | POA: Diagnosis not present

## 2013-05-23 DIAGNOSIS — M25579 Pain in unspecified ankle and joints of unspecified foot: Secondary | ICD-10-CM | POA: Diagnosis not present

## 2013-05-23 DIAGNOSIS — M79609 Pain in unspecified limb: Secondary | ICD-10-CM | POA: Diagnosis not present

## 2013-05-24 DIAGNOSIS — M109 Gout, unspecified: Secondary | ICD-10-CM | POA: Diagnosis not present

## 2013-05-24 DIAGNOSIS — D7289 Other specified disorders of white blood cells: Secondary | ICD-10-CM | POA: Diagnosis not present

## 2013-05-28 DIAGNOSIS — M109 Gout, unspecified: Secondary | ICD-10-CM | POA: Diagnosis not present

## 2013-05-28 DIAGNOSIS — M25569 Pain in unspecified knee: Secondary | ICD-10-CM | POA: Diagnosis not present

## 2013-05-28 DIAGNOSIS — R609 Edema, unspecified: Secondary | ICD-10-CM | POA: Diagnosis not present

## 2013-05-29 DIAGNOSIS — M109 Gout, unspecified: Secondary | ICD-10-CM | POA: Diagnosis not present

## 2013-05-29 DIAGNOSIS — R300 Dysuria: Secondary | ICD-10-CM | POA: Diagnosis not present

## 2013-05-29 DIAGNOSIS — I129 Hypertensive chronic kidney disease with stage 1 through stage 4 chronic kidney disease, or unspecified chronic kidney disease: Secondary | ICD-10-CM | POA: Diagnosis not present

## 2013-05-29 DIAGNOSIS — E1129 Type 2 diabetes mellitus with other diabetic kidney complication: Secondary | ICD-10-CM | POA: Diagnosis not present

## 2013-06-01 DIAGNOSIS — E1129 Type 2 diabetes mellitus with other diabetic kidney complication: Secondary | ICD-10-CM | POA: Diagnosis not present

## 2013-06-01 DIAGNOSIS — I69959 Hemiplegia and hemiparesis following unspecified cerebrovascular disease affecting unspecified side: Secondary | ICD-10-CM | POA: Diagnosis not present

## 2013-06-01 DIAGNOSIS — M109 Gout, unspecified: Secondary | ICD-10-CM | POA: Diagnosis not present

## 2013-06-01 DIAGNOSIS — R262 Difficulty in walking, not elsewhere classified: Secondary | ICD-10-CM | POA: Diagnosis not present

## 2013-06-05 DIAGNOSIS — E1129 Type 2 diabetes mellitus with other diabetic kidney complication: Secondary | ICD-10-CM | POA: Diagnosis not present

## 2013-06-05 DIAGNOSIS — R300 Dysuria: Secondary | ICD-10-CM | POA: Diagnosis not present

## 2013-06-05 DIAGNOSIS — I129 Hypertensive chronic kidney disease with stage 1 through stage 4 chronic kidney disease, or unspecified chronic kidney disease: Secondary | ICD-10-CM | POA: Diagnosis not present

## 2013-06-05 DIAGNOSIS — M109 Gout, unspecified: Secondary | ICD-10-CM | POA: Diagnosis not present

## 2013-06-12 DIAGNOSIS — M109 Gout, unspecified: Secondary | ICD-10-CM | POA: Diagnosis not present

## 2013-06-12 DIAGNOSIS — R609 Edema, unspecified: Secondary | ICD-10-CM | POA: Diagnosis not present

## 2013-06-12 DIAGNOSIS — M25569 Pain in unspecified knee: Secondary | ICD-10-CM | POA: Diagnosis not present

## 2013-06-13 DIAGNOSIS — E1129 Type 2 diabetes mellitus with other diabetic kidney complication: Secondary | ICD-10-CM | POA: Diagnosis not present

## 2013-06-13 DIAGNOSIS — M109 Gout, unspecified: Secondary | ICD-10-CM | POA: Diagnosis not present

## 2013-06-13 DIAGNOSIS — R262 Difficulty in walking, not elsewhere classified: Secondary | ICD-10-CM | POA: Diagnosis not present

## 2013-06-13 DIAGNOSIS — I129 Hypertensive chronic kidney disease with stage 1 through stage 4 chronic kidney disease, or unspecified chronic kidney disease: Secondary | ICD-10-CM | POA: Diagnosis not present

## 2013-06-21 DIAGNOSIS — R809 Proteinuria, unspecified: Secondary | ICD-10-CM | POA: Diagnosis not present

## 2013-06-21 DIAGNOSIS — I129 Hypertensive chronic kidney disease with stage 1 through stage 4 chronic kidney disease, or unspecified chronic kidney disease: Secondary | ICD-10-CM | POA: Diagnosis not present

## 2013-06-21 DIAGNOSIS — E7800 Pure hypercholesterolemia, unspecified: Secondary | ICD-10-CM | POA: Diagnosis not present

## 2013-06-21 DIAGNOSIS — N184 Chronic kidney disease, stage 4 (severe): Secondary | ICD-10-CM | POA: Diagnosis not present

## 2013-06-26 DIAGNOSIS — E119 Type 2 diabetes mellitus without complications: Secondary | ICD-10-CM | POA: Diagnosis not present

## 2013-06-26 DIAGNOSIS — N183 Chronic kidney disease, stage 3 unspecified: Secondary | ICD-10-CM | POA: Diagnosis not present

## 2013-06-26 DIAGNOSIS — I10 Essential (primary) hypertension: Secondary | ICD-10-CM | POA: Diagnosis not present

## 2013-06-26 DIAGNOSIS — M109 Gout, unspecified: Secondary | ICD-10-CM | POA: Diagnosis not present

## 2013-06-30 DIAGNOSIS — N184 Chronic kidney disease, stage 4 (severe): Secondary | ICD-10-CM | POA: Diagnosis not present

## 2013-07-17 ENCOUNTER — Telehealth: Payer: Self-pay | Admitting: *Deleted

## 2013-07-17 DIAGNOSIS — E1165 Type 2 diabetes mellitus with hyperglycemia: Principal | ICD-10-CM

## 2013-07-17 DIAGNOSIS — E1129 Type 2 diabetes mellitus with other diabetic kidney complication: Secondary | ICD-10-CM

## 2013-07-17 NOTE — Telephone Encounter (Signed)
Due to recent surgery, patient will be travelling back to [place].  Order for lipid and A1c placed.

## 2013-07-18 DIAGNOSIS — Z1231 Encounter for screening mammogram for malignant neoplasm of breast: Secondary | ICD-10-CM | POA: Diagnosis not present

## 2013-07-19 DIAGNOSIS — Z01419 Encounter for gynecological examination (general) (routine) without abnormal findings: Secondary | ICD-10-CM | POA: Diagnosis not present

## 2013-07-19 DIAGNOSIS — Z124 Encounter for screening for malignant neoplasm of cervix: Secondary | ICD-10-CM | POA: Diagnosis not present

## 2013-07-23 ENCOUNTER — Ambulatory Visit: Payer: Medicare Other | Admitting: Internal Medicine

## 2013-07-23 DIAGNOSIS — H353190 Nonexudative age-related macular degeneration, unspecified eye, stage unspecified: Secondary | ICD-10-CM | POA: Diagnosis not present

## 2013-07-23 DIAGNOSIS — H2510 Age-related nuclear cataract, unspecified eye: Secondary | ICD-10-CM | POA: Diagnosis not present

## 2013-07-23 DIAGNOSIS — H35039 Hypertensive retinopathy, unspecified eye: Secondary | ICD-10-CM | POA: Diagnosis not present

## 2013-07-23 DIAGNOSIS — H524 Presbyopia: Secondary | ICD-10-CM | POA: Diagnosis not present

## 2013-07-23 LAB — HM DIABETES EYE EXAM

## 2013-07-24 ENCOUNTER — Encounter: Payer: Self-pay | Admitting: Internal Medicine

## 2013-07-24 DIAGNOSIS — Z7189 Other specified counseling: Secondary | ICD-10-CM | POA: Diagnosis not present

## 2013-07-24 DIAGNOSIS — I10 Essential (primary) hypertension: Secondary | ICD-10-CM | POA: Diagnosis not present

## 2013-07-24 DIAGNOSIS — E785 Hyperlipidemia, unspecified: Secondary | ICD-10-CM | POA: Diagnosis not present

## 2013-07-24 DIAGNOSIS — E119 Type 2 diabetes mellitus without complications: Secondary | ICD-10-CM | POA: Diagnosis not present

## 2013-07-31 DIAGNOSIS — IMO0001 Reserved for inherently not codable concepts without codable children: Secondary | ICD-10-CM | POA: Diagnosis not present

## 2013-07-31 DIAGNOSIS — M109 Gout, unspecified: Secondary | ICD-10-CM | POA: Diagnosis not present

## 2013-07-31 DIAGNOSIS — N183 Chronic kidney disease, stage 3 unspecified: Secondary | ICD-10-CM | POA: Diagnosis not present

## 2013-08-07 DIAGNOSIS — D4120 Neoplasm of uncertain behavior of unspecified ureter: Secondary | ICD-10-CM | POA: Diagnosis not present

## 2013-08-07 DIAGNOSIS — D4100 Neoplasm of uncertain behavior of unspecified kidney: Secondary | ICD-10-CM | POA: Diagnosis not present

## 2013-08-07 DIAGNOSIS — Z85528 Personal history of other malignant neoplasm of kidney: Secondary | ICD-10-CM | POA: Diagnosis not present

## 2013-08-07 DIAGNOSIS — C649 Malignant neoplasm of unspecified kidney, except renal pelvis: Secondary | ICD-10-CM | POA: Diagnosis not present

## 2013-08-14 DIAGNOSIS — C649 Malignant neoplasm of unspecified kidney, except renal pelvis: Secondary | ICD-10-CM | POA: Diagnosis not present

## 2013-08-17 ENCOUNTER — Ambulatory Visit
Admission: RE | Admit: 2013-08-17 | Discharge: 2013-08-17 | Disposition: A | Discharge status: Home / Self Care | Payer: Medicare Other | Source: Ambulatory Visit | Attending: Nephrology | Admitting: Nephrology

## 2013-08-17 DIAGNOSIS — M109 Gout, unspecified: Secondary | ICD-10-CM | POA: Diagnosis not present

## 2013-08-17 DIAGNOSIS — N039 Chronic nephritic syndrome with unspecified morphologic changes: Secondary | ICD-10-CM | POA: Diagnosis not present

## 2013-08-17 DIAGNOSIS — E7800 Pure hypercholesterolemia, unspecified: Secondary | ICD-10-CM | POA: Diagnosis not present

## 2013-08-17 DIAGNOSIS — N184 Chronic kidney disease, stage 4 (severe): Secondary | ICD-10-CM | POA: Diagnosis not present

## 2013-08-17 DIAGNOSIS — E559 Vitamin D deficiency, unspecified: Secondary | ICD-10-CM | POA: Diagnosis not present

## 2013-08-17 DIAGNOSIS — D631 Anemia in chronic kidney disease: Secondary | ICD-10-CM | POA: Diagnosis not present

## 2013-08-17 LAB — BASIC METABOLIC PANEL
BUN: 50 mg/dL — ABNORMAL HIGH (ref 7.0–19.0)
CO2: 24 mEq/L (ref 21–30)
Calcium: 9.6 mg/dL (ref 7.9–10.2)
Chloride: 107 mEq/L (ref 100–111)
Creatinine: 2.5 mg/dL — ABNORMAL HIGH (ref 0.4–1.5)
Glucose: 108 mg/dL — ABNORMAL HIGH (ref 70–100)
Potassium: 4.7 mEq/L (ref 3.5–5.3)
Sodium: 140 mEq/L (ref 135–146)

## 2013-08-17 LAB — CBC
Hematocrit: 36.3 % — ABNORMAL LOW (ref 37.0–47.0)
Hgb: 11.9 g/dL — ABNORMAL LOW (ref 12.0–16.0)
MCH: 28 pg (ref 28.0–32.0)
MCHC: 32.8 g/dL (ref 32.0–36.0)
MCV: 85.4 fL (ref 80.0–100.0)
MPV: 11 fL (ref 9.4–12.3)
Nucleated RBC: 0 /100 WBC (ref 0–1)
Platelets: 256 10*3/uL (ref 140–400)
RBC: 4.25 10*6/uL (ref 4.20–5.40)
RDW: 15 % (ref 12–15)
WBC: 8.05 10*3/uL (ref 3.50–10.80)

## 2013-08-17 LAB — MICROALBUMIN, RANDOM URINE
Urine Creatinine, Random: 204.1
Urine Microalbumin, Random: 7 (ref 0.0–30.0)
Urine Microalbumin/Creatinine Ratio: 3 ug/mg — ABNORMAL LOW (ref 66–165)

## 2013-08-17 LAB — GFR: EGFR: 22.7

## 2013-08-17 LAB — URIC ACID: Uric acid: 5.8 mg/dL (ref 2.4–6.0)

## 2013-08-17 LAB — IRON PROFILE
Iron Saturation: 18 % (ref 15–50)
Iron: 54 ug/dL (ref 40–145)
TIBC: 294 ug/dL (ref 265–497)
UIBC: 240 ug/dL (ref 126–382)

## 2013-08-17 LAB — LIPID PANEL
Cholesterol / HDL Ratio: 4.5
Cholesterol: 209 mg/dL — ABNORMAL HIGH (ref 0–199)
HDL: 46 mg/dL (ref >40)
LDL Calculated: 122 mg/dL — ABNORMAL HIGH (ref 0–99)
Triglycerides: 203 mg/dL — ABNORMAL HIGH (ref 34–149)
VLDL Calculated: 41 mg/dL — ABNORMAL HIGH (ref 10–40)

## 2013-08-17 LAB — HEMOLYSIS INDEX: Hemolysis Index: 0 (ref 0–18)

## 2013-08-17 LAB — PHOSPHORUS: Phosphorus: 4.6 mg/dL (ref 2.3–4.7)

## 2013-08-17 LAB — PTH, INTACT: PTH Intact: 218.9 pg/mL — ABNORMAL HIGH (ref 9.0–72.0)

## 2013-08-17 LAB — VITAMIN D,25 OH,TOTAL: Vitamin D, 25 OH, Total: 16 ng/mL — ABNORMAL LOW (ref 30–100)

## 2013-08-22 DIAGNOSIS — R809 Proteinuria, unspecified: Secondary | ICD-10-CM | POA: Diagnosis not present

## 2013-08-22 DIAGNOSIS — I129 Hypertensive chronic kidney disease with stage 1 through stage 4 chronic kidney disease, or unspecified chronic kidney disease: Secondary | ICD-10-CM | POA: Diagnosis not present

## 2013-08-22 DIAGNOSIS — E7800 Pure hypercholesterolemia, unspecified: Secondary | ICD-10-CM | POA: Diagnosis not present

## 2013-08-22 DIAGNOSIS — N184 Chronic kidney disease, stage 4 (severe): Secondary | ICD-10-CM | POA: Diagnosis not present

## 2013-08-29 DIAGNOSIS — N183 Chronic kidney disease, stage 3 unspecified: Secondary | ICD-10-CM | POA: Diagnosis not present

## 2013-08-29 DIAGNOSIS — E119 Type 2 diabetes mellitus without complications: Secondary | ICD-10-CM | POA: Diagnosis not present

## 2013-08-29 DIAGNOSIS — I10 Essential (primary) hypertension: Secondary | ICD-10-CM | POA: Diagnosis not present

## 2013-08-29 DIAGNOSIS — M109 Gout, unspecified: Secondary | ICD-10-CM | POA: Diagnosis not present

## 2013-09-20 DIAGNOSIS — N184 Chronic kidney disease, stage 4 (severe): Secondary | ICD-10-CM | POA: Diagnosis not present

## 2013-09-20 DIAGNOSIS — I129 Hypertensive chronic kidney disease with stage 1 through stage 4 chronic kidney disease, or unspecified chronic kidney disease: Secondary | ICD-10-CM | POA: Diagnosis not present

## 2013-09-20 DIAGNOSIS — E1129 Type 2 diabetes mellitus with other diabetic kidney complication: Secondary | ICD-10-CM | POA: Diagnosis not present

## 2013-09-20 DIAGNOSIS — N2581 Secondary hyperparathyroidism of renal origin: Secondary | ICD-10-CM | POA: Diagnosis not present

## 2013-09-26 ENCOUNTER — Encounter: Payer: Self-pay | Admitting: Internal Medicine

## 2013-10-18 DIAGNOSIS — N2581 Secondary hyperparathyroidism of renal origin: Secondary | ICD-10-CM | POA: Diagnosis not present

## 2013-10-18 DIAGNOSIS — E1121 Type 2 diabetes mellitus with diabetic nephropathy: Secondary | ICD-10-CM | POA: Diagnosis not present

## 2013-10-18 DIAGNOSIS — I129 Hypertensive chronic kidney disease with stage 1 through stage 4 chronic kidney disease, or unspecified chronic kidney disease: Secondary | ICD-10-CM | POA: Diagnosis not present

## 2013-10-18 DIAGNOSIS — E1165 Type 2 diabetes mellitus with hyperglycemia: Secondary | ICD-10-CM | POA: Diagnosis not present

## 2013-10-18 DIAGNOSIS — N184 Chronic kidney disease, stage 4 (severe): Secondary | ICD-10-CM | POA: Diagnosis not present

## 2013-10-25 DIAGNOSIS — I10 Essential (primary) hypertension: Secondary | ICD-10-CM | POA: Diagnosis not present

## 2013-10-25 DIAGNOSIS — K219 Gastro-esophageal reflux disease without esophagitis: Secondary | ICD-10-CM | POA: Diagnosis not present

## 2013-10-25 DIAGNOSIS — F419 Anxiety disorder, unspecified: Secondary | ICD-10-CM | POA: Diagnosis not present

## 2013-10-25 DIAGNOSIS — J4520 Mild intermittent asthma, uncomplicated: Secondary | ICD-10-CM | POA: Diagnosis not present

## 2013-10-26 ENCOUNTER — Ambulatory Visit: Payer: Self-pay | Admitting: Nephrology

## 2013-10-26 DIAGNOSIS — N189 Chronic kidney disease, unspecified: Secondary | ICD-10-CM | POA: Diagnosis not present

## 2013-10-26 DIAGNOSIS — N184 Chronic kidney disease, stage 4 (severe): Secondary | ICD-10-CM | POA: Diagnosis not present

## 2013-10-30 ENCOUNTER — Other Ambulatory Visit: Payer: Self-pay | Admitting: *Deleted

## 2013-10-30 ENCOUNTER — Ambulatory Visit (INDEPENDENT_AMBULATORY_CARE_PROVIDER_SITE_OTHER): Payer: Medicare Other | Admitting: Internal Medicine

## 2013-10-30 ENCOUNTER — Encounter: Payer: Self-pay | Admitting: Internal Medicine

## 2013-10-30 VITALS — BP 110/44 | HR 66 | Temp 98.2°F | Resp 16 | Ht 63.5 in | Wt 211.2 lb

## 2013-10-30 DIAGNOSIS — E1121 Type 2 diabetes mellitus with diabetic nephropathy: Secondary | ICD-10-CM | POA: Diagnosis not present

## 2013-10-30 MED ORDER — GLIPIZIDE ER 5 MG PO TB24: 5.0000 mg | ORAL_TABLET | Freq: Every day | ORAL | Status: DC

## 2013-10-30 NOTE — Progress Notes (Signed)
Subjective:     Patient ID: Megan [name], female   DOB: Jul 14, 1940, 73 y.o.   MRN: [id number]  Diabetes  Ms. Lenker is a pleasant 73 y.o. woman returning for f/u for DM2, dx 1995, insulin-dependent, uncontrolled, with complications (chronic kidney disease). She is here with her husband. Last visit 10 mo ago.  She was dx with L kidney cancer in 10/2012 >> s/p Bx >> pathology inconclusive >> Bx will be resent to [facility] >> partial nephrectomy in 01/2013.  Latest HbA1C: 09/24/2013: HbA1c 7.3% Lab Results  Component Value Date   HGBA1C 6.6* 09/27/2012   HGBA1C 9.1* 06/16/2012   HGBA1C 9.3* 05/03/2012   She takes: - Lantus 20 units bid - NovoLog SSI target 150 >> 200, ISF 30 >> 50 - changes made in the hospital - Not taking the Novolog 5 with dinner - was told to stop this while she was in the hospital She stopped Januvia.  She was previously on Invokana 100 mg, but we need to stop it due to CKD. She was also previously on Glipizide. She was previously on Metformin and that helped tremendously - but had to be taken off Metformin b/c CKD.  She took Welchol in the past, now off.  She checks sugars, but did not bring a sugar log at this time. Per her recall, her sugars are: - am: 160 >> 130-165 >> 95-140, but in last 2 weeks had 4 out of 22 sugars 170-266 >> 100 -125 >> 83-low 100s >> 86-100 - before lunch: 119-132 >> 150s >> n/c >> 80-100 - 2h after lunch: 147 and 256 >> n/c >> 127-200, 220 - before dinner: now 99-102 >> 125-150 (high around 150 if snacking before) >> n/c >> 103-154 - 2h after dinner: now 118-211 >> not checking >> n/c - hs: 240 >> 200-235 >> 118-227 >> 180s as she grazes >> 122-160 No lows other than the 70, has hypoglycemia awareness.   - No neuropathy.  - Has CKD stage 3-4. Last BUN/Cr (per records from Dr. Juleen China - [facility]): 09/24/2013: BUN 54, Cr 3.01 10/19/2012: BUN 34, Cr 1.8, PTH 180  TSH 0.91 09/27/2012: BUN: 78, Cr 2.8  Lab Results  Component Value Date   BUN 48* 07/28/2012   BUN 48* 07/28/2012   CREATININE 2.7* 07/28/2012   CREATININE 2.7* 07/28/2012  Last ACR 38.29 10/19/2012 ( outside tests) Last eye exam in 06/2013. [name]. 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  I reviewed pt's medications, allergies, PMH, social hx, family hx and no changes required except as above. She started Uloric, vitamin D 50,000 IU weekly  Review of Systems Constitutional: + weight gain, + fatigue, + hot flushes/chills, no poor sleep, no nocturia Eyes: + blurry vision, no xerophthalmia ENT: no sore throat, no nodules palpated in throat, no dysphagia/ no odynophagia, + hoarseness Cardiovascular: still has CP/+ SOB/palpitations/+ leg swelling Respiratory: no cough/+ SOB/+ wheezing Gastrointestinal: no nausea/no V/+ C Musculoskeletal: + muscle/+ joint aches Skin: + hair loss, + itching Neurological: no tremors/numbness/tingling/dizziness  Objective:   Physical Exam BP 110/44  Pulse 66  Temp(Src) 98.2 F (36.8 C)  Resp 16  Ht 5' 3.5" (1.613 m)  Wt 211 lb 3.2 oz (95.8 kg)  BMI 36.82 kg/m2  SpO2 97% Wt Readings from Last 3 Encounters:  10/30/13 211 lb 3.2 oz (95.8 kg)  12/22/12 218 lb (98.884 kg)  12/21/12 218 lb 11.2 oz (99.202 kg)   Constitutional: overweight, in NAD Eyes: PERRLA, EOMI, no exophthalmos  ENT: moist mucous membranes, no thyromegaly, no cervical lymphadenopathy Cardiovascular: RRR, No MRG Respiratory: CTA B Gastrointestinal: abdomen soft, NT, ND, BS+ Musculoskeletal: no deformities, strength intact in all 4 Skin: moist, warm, no rashes  Assessment:     1. DM2, poorly controlled, insulin-dependent, with complications - CKD stage 3    Plan:  Pt with long-standing insulin-dependent DM2, with the latest hemoglobin A1c increased from 6.6% to 7.3% after being told to stop mealtime insulin. She checks her sugars but did not bring a log. She has few values in her notebook. We will add Glipizide XL and  I advised her to call me if she develops low CBGs, in that case, we need to decrease Lantus - I advised her to: Patient Instructions  Please continue Lantus 20 units 2x a day. Start Glipizide XL 5 mg in am Please return in 1 month with your sugar log.  - given more sugar logs - no need for HbA1c - we reviewed the one checked ion 09/24/2013 - I will see her back in 3 months with her sugar log

## 2013-10-30 NOTE — Patient Instructions (Signed)
Please continue Lantus 20 units 2x a day. Start Glipizide XL 5 mg in am Please return in 1 month with your sugar log.

## 2013-11-02 ENCOUNTER — Other Ambulatory Visit: Payer: Self-pay

## 2013-11-16 DIAGNOSIS — J069 Acute upper respiratory infection, unspecified: Secondary | ICD-10-CM | POA: Diagnosis not present

## 2013-11-22 ENCOUNTER — Observation Stay: Payer: Self-pay | Admitting: Internal Medicine

## 2013-11-22 DIAGNOSIS — I129 Hypertensive chronic kidney disease with stage 1 through stage 4 chronic kidney disease, or unspecified chronic kidney disease: Secondary | ICD-10-CM | POA: Diagnosis not present

## 2013-11-22 DIAGNOSIS — G459 Transient cerebral ischemic attack, unspecified: Secondary | ICD-10-CM | POA: Diagnosis not present

## 2013-11-22 DIAGNOSIS — Z85528 Personal history of other malignant neoplasm of kidney: Secondary | ICD-10-CM | POA: Diagnosis not present

## 2013-11-22 DIAGNOSIS — E785 Hyperlipidemia, unspecified: Secondary | ICD-10-CM | POA: Diagnosis not present

## 2013-11-22 DIAGNOSIS — I10 Essential (primary) hypertension: Secondary | ICD-10-CM | POA: Diagnosis not present

## 2013-11-22 DIAGNOSIS — I6523 Occlusion and stenosis of bilateral carotid arteries: Secondary | ICD-10-CM | POA: Diagnosis not present

## 2013-11-22 DIAGNOSIS — Z7902 Long term (current) use of antithrombotics/antiplatelets: Secondary | ICD-10-CM | POA: Diagnosis not present

## 2013-11-22 DIAGNOSIS — R200 Anesthesia of skin: Secondary | ICD-10-CM | POA: Diagnosis not present

## 2013-11-22 DIAGNOSIS — N189 Chronic kidney disease, unspecified: Secondary | ICD-10-CM | POA: Diagnosis not present

## 2013-11-22 DIAGNOSIS — M7989 Other specified soft tissue disorders: Secondary | ICD-10-CM | POA: Diagnosis not present

## 2013-11-22 DIAGNOSIS — E1142 Type 2 diabetes mellitus with diabetic polyneuropathy: Secondary | ICD-10-CM | POA: Diagnosis not present

## 2013-11-22 DIAGNOSIS — M1A9XX0 Chronic gout, unspecified, without tophus (tophi): Secondary | ICD-10-CM | POA: Diagnosis not present

## 2013-11-22 DIAGNOSIS — R29810 Facial weakness: Secondary | ICD-10-CM | POA: Diagnosis not present

## 2013-11-22 DIAGNOSIS — E1140 Type 2 diabetes mellitus with diabetic neuropathy, unspecified: Secondary | ICD-10-CM | POA: Diagnosis not present

## 2013-11-22 DIAGNOSIS — I639 Cerebral infarction, unspecified: Secondary | ICD-10-CM | POA: Diagnosis not present

## 2013-11-22 DIAGNOSIS — Z794 Long term (current) use of insulin: Secondary | ICD-10-CM | POA: Diagnosis not present

## 2013-11-22 DIAGNOSIS — Z79899 Other long term (current) drug therapy: Secondary | ICD-10-CM | POA: Diagnosis not present

## 2013-11-22 DIAGNOSIS — I6789 Other cerebrovascular disease: Secondary | ICD-10-CM | POA: Diagnosis not present

## 2013-11-22 DIAGNOSIS — Z905 Acquired absence of kidney: Secondary | ICD-10-CM | POA: Diagnosis not present

## 2013-11-22 DIAGNOSIS — Z8673 Personal history of transient ischemic attack (TIA), and cerebral infarction without residual deficits: Secondary | ICD-10-CM | POA: Diagnosis not present

## 2013-11-22 DIAGNOSIS — H9311 Tinnitus, right ear: Secondary | ICD-10-CM | POA: Diagnosis not present

## 2013-11-22 LAB — URINALYSIS, COMPLETE
BILIRUBIN, UR: NEGATIVE
Blood: NEGATIVE
GLUCOSE, UR: NEGATIVE mg/dL (ref 0–75)
Ketone: NEGATIVE
Nitrite: NEGATIVE
PH: 5 (ref 4.5–8.0)
Protein: NEGATIVE
Specific Gravity: 1.011 (ref 1.003–1.030)
Squamous Epithelial: 10
WBC UR: 4 /HPF (ref 0–5)

## 2013-11-22 LAB — CBC WITH DIFFERENTIAL/PLATELET
BASOS ABS: 0 10*3/uL (ref 0.0–0.1)
Basophil %: 0.5 %
EOS PCT: 2.6 %
Eosinophil #: 0.2 10*3/uL (ref 0.0–0.7)
HCT: 39.1 % (ref 35.0–47.0)
HGB: 12.4 g/dL (ref 12.0–16.0)
LYMPHS PCT: 25.6 %
Lymphocyte #: 2.2 10*3/uL (ref 1.0–3.6)
MCH: 27.5 pg (ref 26.0–34.0)
MCHC: 31.8 g/dL — AB (ref 32.0–36.0)
MCV: 86 fL (ref 80–100)
MONO ABS: 0.8 x10 3/mm (ref 0.2–0.9)
Monocyte %: 9.5 %
Neutrophil #: 5.4 10*3/uL (ref 1.4–6.5)
Neutrophil %: 61.8 %
PLATELETS: 273 10*3/uL (ref 150–440)
RBC: 4.52 10*6/uL (ref 3.80–5.20)
RDW: 15.3 % — ABNORMAL HIGH (ref 11.5–14.5)
WBC: 8.7 10*3/uL (ref 3.6–11.0)

## 2013-11-22 LAB — BASIC METABOLIC PANEL
ANION GAP: 7 (ref 7–16)
BUN: 38 mg/dL — AB (ref 7–18)
CALCIUM: 9.1 mg/dL (ref 8.5–10.1)
CHLORIDE: 108 mmol/L — AB (ref 98–107)
CREATININE: 2.76 mg/dL — AB (ref 0.60–1.30)
Co2: 25 mmol/L (ref 21–32)
EGFR (African American): 22 — ABNORMAL LOW
EGFR (Non-African Amer.): 18 — ABNORMAL LOW
GLUCOSE: 86 mg/dL (ref 65–99)
Osmolality: 288 (ref 275–301)
Potassium: 4.7 mmol/L (ref 3.5–5.1)
SODIUM: 140 mmol/L (ref 136–145)

## 2013-11-22 LAB — TROPONIN I

## 2013-11-23 DIAGNOSIS — I959 Hypotension, unspecified: Secondary | ICD-10-CM | POA: Diagnosis not present

## 2013-11-23 DIAGNOSIS — G459 Transient cerebral ischemic attack, unspecified: Secondary | ICD-10-CM | POA: Diagnosis not present

## 2013-11-23 DIAGNOSIS — I63542 Cerebral infarction due to unspecified occlusion or stenosis of left cerebellar artery: Secondary | ICD-10-CM | POA: Diagnosis not present

## 2013-11-23 LAB — BASIC METABOLIC PANEL
ANION GAP: 8 (ref 7–16)
BUN: 43 mg/dL — ABNORMAL HIGH (ref 7–18)
CHLORIDE: 108 mmol/L — AB (ref 98–107)
CO2: 26 mmol/L (ref 21–32)
CREATININE: 2.73 mg/dL — AB (ref 0.60–1.30)
Calcium, Total: 8.7 mg/dL (ref 8.5–10.1)
EGFR (Non-African Amer.): 18 — ABNORMAL LOW
GFR CALC AF AMER: 22 — AB
Glucose: 81 mg/dL (ref 65–99)
OSMOLALITY: 293 (ref 275–301)
POTASSIUM: 4.4 mmol/L (ref 3.5–5.1)
SODIUM: 142 mmol/L (ref 136–145)

## 2013-11-23 LAB — CBC WITH DIFFERENTIAL/PLATELET
Basophil #: 0 10*3/uL (ref 0.0–0.1)
Basophil %: 0.4 %
Eosinophil #: 0.2 10*3/uL (ref 0.0–0.7)
Eosinophil %: 2.8 %
HCT: 36.8 % (ref 35.0–47.0)
HGB: 12 g/dL (ref 12.0–16.0)
Lymphocyte #: 3.1 10*3/uL (ref 1.0–3.6)
Lymphocyte %: 38.1 %
MCH: 28.2 pg (ref 26.0–34.0)
MCHC: 32.7 g/dL (ref 32.0–36.0)
MCV: 86 fL (ref 80–100)
MONO ABS: 0.7 x10 3/mm (ref 0.2–0.9)
Monocyte %: 9.3 %
NEUTROS ABS: 4 10*3/uL (ref 1.4–6.5)
Neutrophil %: 49.4 %
Platelet: 243 10*3/uL (ref 150–440)
RBC: 4.27 10*6/uL (ref 3.80–5.20)
RDW: 15.2 % — ABNORMAL HIGH (ref 11.5–14.5)
WBC: 8.1 10*3/uL (ref 3.6–11.0)

## 2013-11-26 DIAGNOSIS — D631 Anemia in chronic kidney disease: Secondary | ICD-10-CM | POA: Diagnosis not present

## 2013-11-26 DIAGNOSIS — N184 Chronic kidney disease, stage 4 (severe): Secondary | ICD-10-CM | POA: Diagnosis not present

## 2013-11-26 DIAGNOSIS — I10 Essential (primary) hypertension: Secondary | ICD-10-CM | POA: Diagnosis not present

## 2013-11-26 DIAGNOSIS — N2581 Secondary hyperparathyroidism of renal origin: Secondary | ICD-10-CM | POA: Diagnosis not present

## 2013-11-26 DIAGNOSIS — E1122 Type 2 diabetes mellitus with diabetic chronic kidney disease: Secondary | ICD-10-CM | POA: Diagnosis not present

## 2013-12-05 ENCOUNTER — Telehealth: Payer: Self-pay

## 2013-12-05 NOTE — Telephone Encounter (Signed)
Called pt per [facility] to see if Dr. Ronnald Ramp is still PCP and if so, will need AWV. Spoke with husband who advised no and that pt can advise more when she is available. After research, pt now sees Dr. Tracie Harrier as PCP.

## 2013-12-06 DIAGNOSIS — I10 Essential (primary) hypertension: Secondary | ICD-10-CM | POA: Diagnosis not present

## 2013-12-06 DIAGNOSIS — Z09 Encounter for follow-up examination after completed treatment for conditions other than malignant neoplasm: Secondary | ICD-10-CM | POA: Diagnosis not present

## 2013-12-06 DIAGNOSIS — J4520 Mild intermittent asthma, uncomplicated: Secondary | ICD-10-CM | POA: Diagnosis not present

## 2013-12-06 DIAGNOSIS — K219 Gastro-esophageal reflux disease without esophagitis: Secondary | ICD-10-CM | POA: Diagnosis not present

## 2014-01-25 DIAGNOSIS — N289 Disorder of kidney and ureter, unspecified: Secondary | ICD-10-CM | POA: Diagnosis not present

## 2014-01-25 DIAGNOSIS — E1122 Type 2 diabetes mellitus with diabetic chronic kidney disease: Secondary | ICD-10-CM | POA: Diagnosis not present

## 2014-01-25 DIAGNOSIS — H65194 Other acute nonsuppurative otitis media, recurrent, right ear: Secondary | ICD-10-CM | POA: Diagnosis not present

## 2014-01-25 DIAGNOSIS — I10 Essential (primary) hypertension: Secondary | ICD-10-CM | POA: Diagnosis not present

## 2014-01-29 DIAGNOSIS — N184 Chronic kidney disease, stage 4 (severe): Secondary | ICD-10-CM | POA: Diagnosis not present

## 2014-01-29 DIAGNOSIS — I10 Essential (primary) hypertension: Secondary | ICD-10-CM | POA: Diagnosis not present

## 2014-01-29 DIAGNOSIS — E1122 Type 2 diabetes mellitus with diabetic chronic kidney disease: Secondary | ICD-10-CM | POA: Diagnosis not present

## 2014-01-29 DIAGNOSIS — D631 Anemia in chronic kidney disease: Secondary | ICD-10-CM | POA: Diagnosis not present

## 2014-01-29 DIAGNOSIS — N2581 Secondary hyperparathyroidism of renal origin: Secondary | ICD-10-CM | POA: Diagnosis not present

## 2014-01-31 ENCOUNTER — Ambulatory Visit: Payer: Medicare Other | Admitting: Internal Medicine

## 2014-02-07 DIAGNOSIS — H6121 Impacted cerumen, right ear: Secondary | ICD-10-CM | POA: Diagnosis not present

## 2014-02-07 DIAGNOSIS — H60391 Other infective otitis externa, right ear: Secondary | ICD-10-CM | POA: Diagnosis not present

## 2014-02-22 ENCOUNTER — Ambulatory Visit (INDEPENDENT_AMBULATORY_CARE_PROVIDER_SITE_OTHER): Payer: Medicare Other | Admitting: Internal Medicine

## 2014-02-22 ENCOUNTER — Encounter: Payer: Self-pay | Admitting: Internal Medicine

## 2014-02-22 VITALS — BP 126/62 | HR 98 | Temp 97.5°F | Resp 12 | Wt 211.8 lb

## 2014-02-22 DIAGNOSIS — E1121 Type 2 diabetes mellitus with diabetic nephropathy: Secondary | ICD-10-CM | POA: Diagnosis not present

## 2014-02-22 LAB — HEMOGLOBIN A1C: HEMOGLOBIN A1C: 8.6 % — AB (ref 4.6–6.5)

## 2014-02-22 NOTE — Progress Notes (Signed)
Subjective:     Patient ID: Megan [name], female   DOB: 13-Aug-1940, 74 y.o.   MRN: [id number]  Diabetes  Megan [name] is a pleasant 74 y.o. woman returning for f/u for DM2, dx 1995, insulin-dependent, uncontrolled, with complications (chronic kidney disease). She is here with her husband. Last visit 4 mo ago.  Latest HbA1C: 09/24/2013: HbA1c 7.3% Lab Results  Component Value Date   HGBA1C 6.6* 09/27/2012   HGBA1C 9.1* 06/16/2012   HGBA1C 9.3* 05/03/2012   She takes: - Lantus 20 units bid - Glipizide XL 5 mg daily - added 10/2013  Off NovoLog now She stopped Januvia.  She was previously on Invokana 100 mg, but we need to stop it due to CKD. She was also previously on Glipizide. She was previously on Metformin and that helped tremendously - but had to be taken off Metformin b/c CKD.  She took Welchol in the past, now off.  She checks sugars, but did not bring a sugar log at this time. Per her recall, her sugars are: - am: 100 -125 >> 83-low 100s >> 86-100 >> 87-106 - 2h after b'fast: 137 - before lunch: 119-132 >> 150s >> n/c >> 80-100 >> 137, 139 - 2h after lunch: 147 and 256 >> n/c >> 127-200, 220 >> 143-220 - before dinner: 99-102 >> 125-150 >> n/c >> 103-154 >> n/c - 2h after dinner: now 118-211 >> not checking >> n/c - hs: 240 >> 200-235 >> 118-227 >> 180s as she grazes >> 122-160 >> 191-312 No lows <80s, has hypoglycemia awareness.   - No neuropathy.  - Has CKD stage 3-4. Last BUN/Cr (per records from Megan [name] - [facility]): 01/29/2014: BUN 39, Cr 2.14 09/24/2013: BUN 54, Cr 3.01 10/19/2012: BUN 34, Cr 1.8, PTH 180  TSH 0.91 09/27/2012: BUN: 78, Cr 2.8  Lab Results  Component Value Date   BUN 48* 07/28/2012   BUN 48* 07/28/2012   CREATININE 2.7* 07/28/2012   CREATININE 2.7* 07/28/2012  Last ACR 38.29 10/19/2012 ( outside tests)  Last eye exam in 06/2013. Megan [name]. 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  I reviewed pt's  medications, allergies, PMH, social hx, family hx and no changes required except as above. She started Uloric, vitamin D 50,000 IU weekly. She was dx with L kidney cancer in 10/2012 >> s/p Bx >> pathology inconclusive >> Bx will be resent to [facility] >> partial nephrectomy in 01/2013.  Review of Systems Constitutional: + weight gain,no  fatigue, + hot flushes/chills, no poor sleep, no nocturia Eyes: no blurry vision, no xerophthalmia ENT: no sore throat, no nodules palpated in throat, no dysphagia/ no odynophagia, + tinnitus Cardiovascular: still has CP/+ SOB/palpitations/+ leg swelling Respiratory: + cough/+ SOB/+ wheezing Gastrointestinal: no nausea/no V/+ C Musculoskeletal: + muscle/+ joint aches Skin: + hair loss, + itching Neurological: no tremors/numbness/tingling/dizziness  Objective:   Physical Exam BP 126/62 mmHg  Pulse 98  Temp(Src) 97.5 F (36.4 C) (Oral)  Resp 12  Wt 211 lb 12.8 oz (96.072 kg)  SpO2 97% Wt Readings from Last 3 Encounters:  02/22/14 211 lb 12.8 oz (96.072 kg)  10/30/13 211 lb 3.2 oz (95.8 kg)  12/22/12 218 lb (98.884 kg)   Constitutional: overweight, in NAD Eyes: PERRLA, EOMI, no exophthalmos ENT: moist mucous membranes, no thyromegaly, no cervical lymphadenopathy Cardiovascular: RRR, No MRG Respiratory: CTA B Gastrointestinal: abdomen soft, NT, ND, BS+ Musculoskeletal: no deformities, strength intact in all 4 Skin: moist, warm, no rashes  Assessment:  1. DM2, poorly controlled, insulin-dependent, with complications - CKD stage 3    Plan:  Pt with long-standing insulin-dependent DM2, with the latest hemoglobin A1c increased from 6.6% to 7.3% after being told to stop mealtime insulin. In the last 3 mo she over-ate and sugars are higher. However, she wants to get back on track with the diet.  Will continue Glipizide XL5 mg in am but I advised her to increase to 10 mg in 1 mo if dietary measures do not help her sugars. We will also decrease Lantus  at bedtime as she drops her sugars significantly from hs to am. - I advised her to: Patient Instructions  Please decrease the Lantus to 20 units in am and 15 units at bedtime.  Please start watching your diet but if the sugars do not improve in 1 month, then increase Glipizide XL to 10 mg daily.  Please stop at the lab.  Please come back for a follow-up appointment in 3 months with your log.  - given more sugar logs - will check a HbA1c - I will see her back in 3 months with her sugar log  Office Visit on 02/22/2014  Component Date Value Ref Range Status  . Hgb A1c MFr Bld 02/22/2014 8.6* 4.6 - 6.5 % Final   Glycemic Control Guidelines for People with Diabetes:Non Diabetic:  <6%Goal of Therapy: <7%Additional Action Suggested:  >8%    Sugars higher >> see plan above to start improving her diet and increase Glipizide as needed after this.

## 2014-02-22 NOTE — Patient Instructions (Addendum)
Please decrease the Lantus to 20 units in am and 15 units at bedtime.  Please start watching your diet but if the sugars do not improve in 1 month, then increase Glipizide XL to 10 mg daily.  Please stop at the lab.  Please come back for a follow-up appointment in 3 months with your log.

## 2014-03-11 DIAGNOSIS — C649 Malignant neoplasm of unspecified kidney, except renal pelvis: Secondary | ICD-10-CM | POA: Diagnosis not present

## 2014-03-15 ENCOUNTER — Other Ambulatory Visit: Payer: Self-pay | Admitting: *Deleted

## 2014-03-15 DIAGNOSIS — H9201 Otalgia, right ear: Secondary | ICD-10-CM | POA: Diagnosis not present

## 2014-03-15 DIAGNOSIS — N183 Chronic kidney disease, stage 3 (moderate): Secondary | ICD-10-CM | POA: Diagnosis not present

## 2014-03-15 DIAGNOSIS — I10 Essential (primary) hypertension: Secondary | ICD-10-CM | POA: Diagnosis not present

## 2014-03-15 DIAGNOSIS — E119 Type 2 diabetes mellitus without complications: Secondary | ICD-10-CM | POA: Diagnosis not present

## 2014-03-21 DIAGNOSIS — H6061 Unspecified chronic otitis externa, right ear: Secondary | ICD-10-CM | POA: Diagnosis not present

## 2014-03-21 DIAGNOSIS — H9113 Presbycusis, bilateral: Secondary | ICD-10-CM | POA: Diagnosis not present

## 2014-03-25 ENCOUNTER — Telehealth: Payer: Self-pay | Admitting: Internal Medicine

## 2014-03-25 NOTE — Telephone Encounter (Signed)
Per CY-lets work patient in. Pt will be here on Tuesday 03-26-14 at 11:15am to see CY. Nothing more needed at this time.

## 2014-03-25 NOTE — Telephone Encounter (Signed)
Spoke with pt, c/o runny nose since November, as well as PND and nonprod cough.  Also c/o itching in ear- has been to 2 ent doctors for this.  Denies fever, chest congestion.   Is requesting recs.  Pt requesting appt this week as she has not been seen since 2014 but no available openings.   Last ov: 04/2012 Next [id number]  CY please advise.  Thanks!  Allergies  Allergen Reactions  . Crestor [Rosuvastatin]     myopathy  . Ivp Dye [Iodinated Diagnostic Agents] Other (See Comments)    Nausea and SOB per patient  . Pioglitazone     REACTION: weight gain  . Sulfonamide Derivatives     REACTION: bLISTERS   Current Outpatient Prescriptions on File Prior to Visit  Medication Sig Dispense Refill  . albuterol (PROVENTIL HFA;VENTOLIN HFA) 108 (90 BASE) MCG/ACT inhaler Inhale 2 puffs into the lungs every 4 (four) hours as needed.    Marland Kitchen amLODipine (NORVASC) 10 MG tablet Take 1 tablet by mouth daily.    Marland Kitchen atorvastatin (LIPITOR) 40 MG tablet Take 40 mg by mouth daily.    Marland Kitchen BENICAR 40 MG tablet Take 40 mg by mouth daily.     Marland Kitchen dicyclomine (BENTYL) 10 MG capsule Take 1 capsule (10 mg total) by mouth every morning. Take 10 mg po BID for diarrhea, urgency 90 capsule 1  . ergocalciferol (VITAMIN D2) 50000 UNITS capsule Take 50,000 Units by mouth every 30 (thirty) days.    . furosemide (LASIX) 40 MG tablet Take 1 tablet by mouth daily.    Marland Kitchen glipiZIDE (GLIPIZIDE XL) 5 MG 24 hr tablet Take 1 tablet (5 mg total) by mouth daily with breakfast. 180 tablet 1  . glucose blood (ONE TOUCH ULTRA TEST) test strip USE tID 300 each 3  . Insulin Glargine (LANTUS SOLOSTAR) 100 UNIT/ML Solostar Pen Inject 20 Units into the skin 2 (two) times daily. 15 mL 6  . insulin glargine (LANTUS) 100 UNIT/ML injection Inject into the skin at bedtime. Inject 20 units in am and 15 units at bedtime    . Insulin Pen Needle (PEN NEEDLES 31GX5/16") 31G X 8 MM MISC Use bid 200 each 3  . ipratropium (ATROVENT) 0.03 % nasal spray 1-2 puffs each  nostril up to every 8 hours if needed 30 mL 12  . labetalol (NORMODYNE) 200 MG tablet Take 1 tablet by mouth 3 (three) times daily.     Marland Kitchen lisinopril (PRINIVIL,ZESTRIL) 5 MG tablet Take 5 mg by mouth daily.    . pantoprazole (PROTONIX) 40 MG tablet Take 1 tablet (40 mg total) by mouth daily. 90 tablet 3  . PLAVIX 75 MG tablet TAKE 1 TABLET BY MOUTH ONCE A DAY 90 tablet 2  . pregabalin (LYRICA) 50 MG capsule Take 1 capsule (50 mg total) by mouth as needed. 30 capsule 8  . ULORIC 40 MG tablet 80 mg.     . zolpidem (AMBIEN) 5 MG tablet TAKE 1 TABLET BY MOUTH AT BEDTIME AS NEEDED 30 tablet 2   No current facility-administered medications on file prior to visit.

## 2014-03-26 ENCOUNTER — Ambulatory Visit (INDEPENDENT_AMBULATORY_CARE_PROVIDER_SITE_OTHER): Payer: Medicare Other | Admitting: Internal Medicine

## 2014-03-26 ENCOUNTER — Encounter: Payer: Self-pay | Admitting: Internal Medicine

## 2014-03-26 VITALS — BP 128/78 | HR 74 | Ht 64.0 in | Wt 213.6 lb

## 2014-03-26 DIAGNOSIS — H60399 Other infective otitis externa, unspecified ear: Secondary | ICD-10-CM | POA: Insufficient documentation

## 2014-03-26 DIAGNOSIS — H60391 Other infective otitis externa, right ear: Secondary | ICD-10-CM

## 2014-03-26 DIAGNOSIS — H6060 Unspecified chronic otitis externa, unspecified ear: Secondary | ICD-10-CM | POA: Diagnosis not present

## 2014-03-26 MED ORDER — FLUCONAZOLE 150 MG PO TABS
150.0000 mg | ORAL_TABLET | Freq: Every day | ORAL | Status: DC
Start: 2014-03-26 — End: 2014-03-26

## 2014-03-26 MED ORDER — FLUCONAZOLE 150 MG PO TABS: 150.0000 mg | ORAL_TABLET | Freq: Every day | ORAL | Status: DC

## 2014-03-26 NOTE — Patient Instructions (Signed)
Script printed diflucan for fungus  Order- refer to [facility]   Dx chronic otitis

## 2014-03-26 NOTE — Assessment & Plan Note (Signed)
She has seen 2 out of town ENTs, one of whom told her she had a fungal otitis. I suggested she settle on one ENT to follow her consistently. She asks referral locally.  Plan- boost antifungal therapy with diflucan while awaiting local ENT appointment

## 2014-03-26 NOTE — Progress Notes (Signed)
10/29/10- Alllergic rhinitis, asthma, hx urticaria, complicated by DM, renal insufficiency Last here- Dec 05, 2009. Had flu shot She continues to stay with her daughter in [place] for months at a time.  Over the last month has had typical for this season, chest tightness and nasal congestion with wheeze. Qvar not sufficient and she ran out of Singulair. While in [place] she saw cardiologist for dyspnea who did stress test. He recommended she get a sleep study.Treating for HBP and renal insufficiency She is aware of snore, but alone. Denies awareness of daytime sleepiness. Takes alprazolam for occasional insomnia.   01/05/11- Alllergic rhinitis, asthma, hx urticaria, complicated by DM, renal insufficiency Husband here Nose became congested overnight but chest ok. Got saline sinus rinse.  Asthma-preferred Qvar to [facility].  Sleep- wakes frequently unless she takes [place]. She doesn't mind the limb jerks. Discussed meds and oxygen for sleep.  10/05/11-  23 yoF former smoker followed for alllergic rhinitis, asthma/ COPD, hx urticaria, complicated by DM, renal insufficiency, GERD           husband here ACUTE VISIT: Increased SOB worse with activity-worse at night with sleeping--wheezing. Has had increase in weight. Dry cough mostly-through out the day has productive cough-white in color. Never got the sleep study scheduled 11/20/10 last year. CT chest 2005 reported emphysema. Over the past 3 months has had more wheezing shortness of breath. These wake her at night and she feels that she fights for her breath. Wheeze with walking. Has lost some weight. Using Vicks VapoRub which helps. Out of Ventolin which also helped. Noticing nasal congestion. Denies fever, purulent discharge or sore throat. Some swelling in her feet she attributes to gout. Cardiologist in [place] told her "nothing wrong with her heart".  02/11/12- 37 yoF former smoker followed for alllergic rhinitis, asthma/ COPD, hx urticaria,  complicated by DM, renal insufficiency, GERD           husband here FOLLOWS FOR: having SOB, wheezing, cough-productive-white in color, and chest congestion. Review PFT and 6MW test with patient Had flu like illness without fever, after flu shot, slowly resolving. This week a deep breath hurts midsternum. Reports stridor with increased physical exertion. PFT 10/26/2011 mild obstructive airways disease, small airways, insignificant response to bronchodilator. Diffusion moderately reduced. FVC 2.46/92%, FEV1 1.76/94%, FEV1/FVC 0.71, FEF 25/75% 1.13/52%. TLC 86%, DLCO 46%. 6 MWT-99%, 97%, 99%, 297.5 m with complaint of right calf pain but no oxygen limitation. CXR 10/06/11 IMPRESSION:  No acute abnormalities.  Original Report Authenticated By: Burnetta Sabin, M.D.  03/29/12- 39 yoF former smoker followed for alllergic rhinitis, asthma/ COPD, hx urticaria, complicated by DM, renal insufficiency, GERD         Son here.   ACUTE VISIT: still having trouble breathing; still having soreness in chest that raidates to shoulders and back-pulls from sore spot when sneezing. Also, back of tongue not helping with swallowing food as it should; jaw tires easily. Head congestion;-no color when blowing nose.  At last visit we had given Z-Pak and cough syrup. That may have helped at the time. Her left upper anterior chest wall sore spot persists, increased by sneezing or lifting her left arm which causes sharp pain across anterior chest. No trauma. Chest CT showed no obvious cause. She does not describe anything anginal. Now with acute upper respiratory complaints of right ear pain and nasal congestion. She restarted Nasonex 3 days ago. CT chest 02/25/12- images reviewed with her IMPRESSION:  1. No acute findings in the thorax  to account for the patient's  symptoms.  2. Mild diffuse bronchial wall thickening with moderate  centrilobular emphysema; imaging findings compatible with  underlying COPD.  3. Atherosclerosis,  including left main and three-vessel coronary  artery disease. Assessment for potential risk factor  modification, dietary therapy or pharmacologic therapy may be  warranted, if clinically indicated.  4. No evidence of substernal thyroid.  5. Small hiatal hernia.  6. Additional incidental findings, as above.  Original Report Authenticated By: Vinnie Langton, M.D.  05/09/12- 26 yoF former smoker followed for alllergic rhinitis, asthma/ COPD, hx urticaria, complicated by DM, renal insufficiency, GERD         Son here Head congestion began last night, caught a cold from husband. Increasing cough with near tussive syncope. Sharp tussive pains are still present left upper anterior chest. Possible fever x1 day with yellow phlegm. She likes the way Dymista nasal spray work but didn't like the taste.  03/26/14-  78 yoF former smoker followed for alllergic rhinitis, asthma/ COPD, hx urticaria, complicated by hx kidney cancer, DM, GERD, hx CVA ACUTE VISIT: 10-2013 had episode of runny nose and ear issues-was given abx and cough syrup--did not get better-week later face started dropping down-was sent to ER for hx of stroke> Bells Palsy. Ears were washed  -was given Rx for ears. Pt is currently having  runny nose and non productive cough.       Husband here Clarification- CVA 2004.MRI brain 2015. Kidney cancer/ partial nephrectomy 2015. Has seen 2 ENT elsewhere. Asks for local referral for R fungal otitis with itching with tinnitus and R eustachian discomfort being Rx'd with clotrimazole oint.  Breathing ok w/o wheeze/ . ROS- see HP. Constitutional:   No-   weight loss, night sweats, fevers, chills, fatigue, lassitude. HEENT:   No-  headaches, difficulty swallowing, tooth/dental problems, sore throat,       No-  sneezing, itching, ear ache, +nasal congestion, post nasal drip,  CV:  noncardiac chest pain, no-orthopnea, PND, swelling in lower extremities, anasarca,                  dizziness,  No-palpitations Resp: + shortness of breath with exertion or at rest.                productive cough,  No non-productive cough,  No-  coughing up of blood.               change in color of mucus.  No- wheezing.   Skin: No-   rash or lesions. GI:  No-   heartburn, indigestion, abdominal pain, nausea, vomiting,  GU: MS:  +  joint pain or swelling.  . Neuro- nothing unusual Psych:  No- change in mood or affect. No depression or anxiety.  No memory loss.  OBJ General- Alert, Oriented, Affect-appropriate, Distress- none acute, Obese, talkative . Wearing a paper facemask today .  Skin- rash-none, lesions- none, excoriation- none Lymphadenopathy- none Head- atraumatic            Eyes- Gross vision intact, PERRLA, conjunctivae clear secretions            Ears- +R canal excoriated, slight scab, not obstructed            Nose- +turbinate edema, no-Septal dev, +mucus bridging, no-polyps, erosion, perforation             Throat- Mallampati III , mucosa clear , drainage- none, tonsils- atrophic Neck- flexible , trachea midline, no stridor , thyroid nl, carotid  no bruit Chest - symmetrical excursion , unlabored           Heart/CV- RRR , no murmur , no gallop  , no rub, nl s1 s2                           - JVD- none , +trace edema, no-stasis changes- none, varices- none           Lung- clear to P&A, wheeze+ trace, cough- none , dullness-none, rub- none           Chest wall-  Abd-  Br/ Gen/ Rectal- Not done, not indicated Extrem- cyanosis- none, clubbing, none, atrophy- none, strength- nl. these Neuro- grossly intact to observation

## 2014-03-27 DIAGNOSIS — L299 Pruritus, unspecified: Secondary | ICD-10-CM | POA: Diagnosis not present

## 2014-04-18 DIAGNOSIS — E559 Vitamin D deficiency, unspecified: Secondary | ICD-10-CM | POA: Diagnosis not present

## 2014-04-18 DIAGNOSIS — E1122 Type 2 diabetes mellitus with diabetic chronic kidney disease: Secondary | ICD-10-CM | POA: Diagnosis not present

## 2014-04-18 DIAGNOSIS — I10 Essential (primary) hypertension: Secondary | ICD-10-CM | POA: Diagnosis not present

## 2014-04-18 DIAGNOSIS — J069 Acute upper respiratory infection, unspecified: Secondary | ICD-10-CM | POA: Diagnosis not present

## 2014-04-30 ENCOUNTER — Telehealth: Payer: Self-pay | Admitting: Internal Medicine

## 2014-04-30 ENCOUNTER — Other Ambulatory Visit: Payer: Self-pay | Admitting: *Deleted

## 2014-04-30 MED ORDER — INSULIN GLARGINE 100 UNIT/ML ~~LOC~~ SOLN: SUBCUTANEOUS | Status: DC

## 2014-04-30 NOTE — Telephone Encounter (Signed)
Need a refill of lantus solostar, [facility]

## 2014-05-11 NOTE — Discharge Summary (Signed)
PATIENT NAME:  Megan [name], Megan [name] MR#:  [id number] DATE OF BIRTH:  July 02, 1940  DATE OF ADMISSION:  11/22/2013 DATE OF DISCHARGE:  11/23/2013  DIAGNOSES AT TIME OF DISCHARGE:  1.  Right-sided facial numbness consistent with transient ischemic attack.  2.  Chronic kidney disease.  3.  Hyperlipidemia.  4.  Hypertension.  5.  Type 2 diabetes.  6.  History of gout.  7.  History of diabetic neuropathy.   CHIEF COMPLAINT: Right-sided facial numbness and fullness.   HISTORY OF PRESENT ILLNESS: Megan [name] is a 74 year old female with a history of hypertension, diabetes and gout, who comes to the ED complaining of right facial numbness and stated that she also has been having a fullness in the right side of the face and ear. She denies any weakness in the arms or legs. No slurred speech.   PAST MEDICAL HISTORY: Significant for hypertension, type 2 diabetes, hyperlipidemia, chronic gout, history of kidney cancer status post left partial nephrectomy and had also experienced cold-like symptoms a few days back for which she received a course of Zithromax.   Please see H and P for full details.   HOSPITAL COURSE: The patient was admitted to [facility] and initially underwent a CT scan of the head, which showed a 4.8 mm low density in the left posterior/anterior parietal white matter, probably a small infarct, age was indeterminate. She subsequently underwent an MRI of the brain, which showed chronic left ventricular periventricular infarct; no acute intracranial abnormalities.  The patient's symptoms gradually improved, and she was keen to go home. She was ambulated and appeared stable at the time of discharge. An ultrasound of the carotids showed mild atherosclerotic disease in the carotid arteries bilaterally. Estimated degree of stenosis of the internal carotid arteries with less than 50% bilaterally.   The patient remained stable during her stay in the hospital, and she was discharged on the following  medications.   DISCHARGE MEDICATIONS: Plavix 75 mg once a day, glipizide 5 mg once a day, famotidine 40 mg once a day, Uloric 80 mg once a day, furosemide 20 mg b.i.d., Lantus SoloStar Pen 20 units subcutaneous twice a day, NovoLog FlexPen t.i.d. as per sliding scale, amlodipine 10 mg once a day, atorvastatin 40 mg once daily, Lyrica 50 mg once at bedtime, Cortisporin Otic solution 2 drops to the affected ear x 10 days.   FOLLOWUP: The patient is advised to follow with me, Dr. Ginette Pitman, in 1-2 weeks' time, and call back with any questions or concerns.   TOTAL TIME SPENT ON DISCHARGING PATIENT: 35 minutes    ____________________________ Tracie Harrier, MD vh:MT D: 11/24/2013 12:03:43 ET T: 11/24/2013 13:26:59 ET JOB#: [id number]  cc: Tracie Harrier, MD, <Dictator> Tracie Harrier MD ELECTRONICALLY SIGNED 12/18/2013 17:47

## 2014-05-11 NOTE — H&P (Signed)
PATIENT NAME:  Megan [name], Megan [name] MR#:  [id number] DATE OF BIRTH:  1940-09-29  DATE OF ADMISSION:  11/22/2013 PMD:DR.Hande  CHIEF COMPLAINT:  Right-sided facial numbness.   HISTORY OF PRESENT ILLNESS:  A 74 year old female with hypertension, diabetes, gout comes in because of right facial numbness and right facial lobe started today morning. The patient did not have any weakness in the legs or hands.  No slurred speech but because of the right-sided facial numbness and the facial droop, which started this morning at around 7:00 or 8:00 a.m. She came to the Emergency Room concerning for possible stroke. The patient is alert, oriented. Did not have any weakness in hands or legs.  The patient is slightly weak from previous stroke on the right side. The patient has no blurred vision. No loss of vision. The patient did not feel any chest pain or palpitations. Significant for hypertension, diabetes, hyperlipidemia, chronic gout.   History of kidney cancer, status post partial nephrectomy on the left side.  The patient recently felt cold symptoms and ringing in the right ear and she came into urgent care a week ago and was given Zithromax.   She does not have any positional vertigo.    ALLERGIES:  SULFA, CRESTOR, IV DYE, ACTOS  MEDICATIONS:  Amlodipine 10 mg p.o. daily, atorvastatin 20 mg p.o. daily, azithromycin, furosemide 20 mg p.o. b.i.d., glipizide XL 5 mg p.o. daily, insulin aspart as needed 3 times a day with meals and according to sliding scale, labetalol 200 mg b.i.d. , Lantus 18 units twice a day, pantoprazole 40 mg p.o. daily, Plavix 75 mg p.o. daily, Lyrica 50 mg at bedtime, (uloric  80 mg daily, Ambien 5 mg as needed, lisinopril 5 mg p.o. daily.    PAST MEDICAL HISTORY:  History of previous stroke with mild right slight residual deficit on the right side.   PAST SURGICAL HISTORY:  Significant for history of left partial nephrectomy this year in January in [place].   REVIEW OF  SYSTEMS: CONSTITUTIONAL:  The patient has no fever. No fatigue.  EYES: No blurred vision.  ENT:  The patient feels ringing in the right ear and the ear wax is cleaned in the ER by ER doctor.  No epistaxis. No  difficulty in swallowing RESPIRATORY:  Feels congested and cough.  The patient finished azithromycin recently.  CARDIOVASCULAR: No chest pain. No orthopnea. No pedal edema.  GASTROINTESTINAL:  No nausea. No vomiting. No abdominal pain.  GENITOURINARY: :  No dysuria.  ENDOCRINE:  No polyuria or polydipsia.  INTEGUMENT:  No skin rashes.  MUSCULOSKELETAL: Denies any joint pain.  NEUROLOGIC:  Felt numb on the right side of the face and right facial droop today.  The patient denies any weakness. No dysarthria.  No epilepsy. No ataxia.  No headache. The patient had history of CVA before and has a still right-sided deficit.  PSYCHIATRIC: Does have a history of anxiety.   PHYSICAL EXAMINATION: VITAL SIGNS: Temperature 97.7, heart rate 55, blood pressure 138/60. Sats 96% on room air.  GENERAL: Alert, awake, oriented, elderly female, not in distress, answering questions appropriately.  HEAD:  Normocephalic, atraumatic.  EYES:  Pupils equal, reacting to light. Extraocular movements are intact.  ENT:  (no tympanic membrane congestion.  No turbinate hypertrophy.  No oropharyngeal edema.  NECK:  Supple.  No JVD.  No carotid bruit.  CARDIOVASCULAR:  S1, S2 regular. No murmurs.  The patient has no peripheral edema.  Femoral pulses and pedal pulses are equal. LUNGS:  Clear  to auscultation.  No wheeze.  No rales.  ABDOMEN:  Soft, nontender, nondistended.  Bowel sounds are present. No hernias.  CARDIOVASCULAR: The patient has no peripheral edema. Femoral pulses and pedal pulses are equal.  Toe.  MUSCULOSKELETAL:  Normal gait and station. Able to move all extremities.  SKIN:  Inspection is normal.  NEUROLOGIC:  Cranial nerves II through XII intact. Power is slightly decreased in the right upper and  lower extremities. Sensations are diminished on the right side of the face. The patient  power   5/5intact on the left side upper and lower extremities.  DTRs 2+ bilaterally upper and lower extremity.  (PSYCHIATRIC:  Mood and affect are within normal limits.   LABORATORY DATA:  WBC 8.7, hemoglobin 12.4, hematocrit 39.1, platelets 273.   Electrolytes:  Sodium is 140, potassium 4.7, chloride 108, bicarbonate 27. BUN 38, creatinine 2.76. Glucose 86.  Troponin less than 0.02.  The patient's creatinine is around 2.7 as per her.   Urine shows leukocyte esterase 1+ and WBC 4.  DIAGNOSTIC DATA:  Head CAT scan shows density in the left posterior frontal and anterior parietal white matter which is due to small infarct, age indeterminate.    ASSESSMENT AND PLAN:   39.  A 74 year old female with right-sided facial numbness.  Admit to observation.  Initial CAT scan did not show any stroke.  The patient with right-sided numbness and facial droop.  Will observe her overnight.  Check MRI of the brain.,carotid sono and echocardiogram, neuro checks every 4 hours and then every 4 hours after that.  The patient has risk factors of hypertension and diabetes, hyperlipidemia, so we are going to evaluate for any possible transient ischemic attack or stroke.  2.  Chronic kidney disease.  History of kidney cancer that is status post nephrectomy. The patient follows up with Dr. (singh,>>.  Avoid nephrotoxic medications.  She is on Lasix and lisinopril. Continue them.  3.  Hyperlipidemia. Continue atorvastatin 20 mg daily.  4.  Hypertension. The patient takes labetalol, amlodipine, lisinopril. Continue them.  5.  Type 2 diabetes mellitus.  She is on glipizide XL and also  on lantus, .  Will do sliding scale with coverage. The patient can be started on glipizide today, but Lantus will be started tomorrow as she said the patient is not eating much  recently due to upper respiratory tract infection.  6.  History of gout. She  takes Uloric, 7.  History of previous stroke.  She takes Plavix 75 mg daily. Continue that.  8.  History of diabetic neuropathy. She is on Lyrica 50 mg p.o. daily. Continue that.    The patient will be signed out to  [facility]. Time spent:  55 minutes    ____________________________ Epifanio Lesches, MD sk:jw D: 11/22/2013 18:43:41 ET T: 11/22/2013 19:06:56 ET JOB#: [id number]  cc: Epifanio Lesches, MD, <Dictator> Epifanio Lesches MD ELECTRONICALLY SIGNED 12/17/2013 18:35

## 2014-05-23 ENCOUNTER — Encounter: Payer: Self-pay | Admitting: Internal Medicine

## 2014-05-23 ENCOUNTER — Ambulatory Visit (INDEPENDENT_AMBULATORY_CARE_PROVIDER_SITE_OTHER): Payer: Medicare Other | Admitting: Internal Medicine

## 2014-05-23 VITALS — BP 112/60 | HR 63 | Temp 97.4°F | Resp 12 | Wt 206.0 lb

## 2014-05-23 DIAGNOSIS — E1121 Type 2 diabetes mellitus with diabetic nephropathy: Secondary | ICD-10-CM | POA: Diagnosis not present

## 2014-05-23 LAB — HEMOGLOBIN A1C: HEMOGLOBIN A1C: 7.7 % — AB (ref 4.6–6.5)

## 2014-05-23 MED ORDER — INSULIN GLARGINE 100 UNIT/ML ~~LOC~~ SOLN
SUBCUTANEOUS | Status: DC
Start: 2014-05-23 — End: 2015-05-28

## 2014-05-23 NOTE — Patient Instructions (Addendum)
Please decrease the Lantus to 15 units in am and 10 units at bedtime. Continue to stay off Glipizide XL.  Please stop at the lab.  Please come back for a follow-up appointment in 3 months with your log.  KEEP UP THE GREAT WORK!!!!

## 2014-05-23 NOTE — Progress Notes (Signed)
Subjective:     Patient ID: Megan [name], female   DOB: [birth year], 74 y.o.   MRN: [id number]  Diabetes  Ms. Carstensen is a pleasant 74 y.o. woman returning for f/u for DM2, dx 1995, insulin-dependent, uncontrolled, with complications (chronic kidney disease). She is here with her husband. Last visit 3 mo ago.  Latest HbA1C: Lab Results  Component Value Date   HGBA1C 8.6* 02/22/2014   HGBA1C 6.6* 09/27/2012   HGBA1C 9.1* 06/16/2012  09/24/2013: HbA1c 7.3%  She takes: - Decreased Lantus to 15 units in am and 15 units in hs Stopped Glipizide XL 5 mg daily b/c low  Off NovoLog now She stopped Januvia.  She was previously on Invokana 100 mg, but we need to stop it due to CKD. She was also previously on Glipizide. She was previously on Metformin and that helped tremendously - but had to be taken off Metformin b/c CKD.  She took Welchol in the past, now off.  She checks sugars, but did not bring a sugar log at this time. Per her recall, her sugars are much improved on a new diet (started Paleo diet 3 weeks ago): - am: 100 -125 >> 83-low 100s >> 86-100 >> 87-106 >> 65-105 - 2h after b'fast: 137 >> 108-145, 210 - before lunch: 119-132 >> 150s >> n/c >> 80-100 >> 137, 139 >> 98-121, 169 - 2h after lunch: 147 and 256 >> n/c >> 127-200, 220 >> 143-220 >> 108-128 - before dinner: 99-102 >> 125-150 >> n/c >> 103-154 >> n/c >> 102-123 - 2h after dinner: now 118-211 >> not checking >> n/c >> 107-168 - hs: 240 >> 200-235 >> 118-227 >> 180s as she grazes >> 122-160 >> 191-312 >> 75-145, 151 +1 low 65, has hypoglycemia awareness.   - No neuropathy.  - Has CKD stage 3-4. Last BUN/Cr (per records from Dr. Juleen China - [facility]): 01/29/2014: BUN 39, Cr 2.14 09/24/2013: BUN 54, Cr 3.01 10/19/2012: BUN 34, Cr 1.8, PTH 180  TSH 0.91 09/27/2012: BUN: 78, Cr 2.8  Lab Results  Component Value Date   BUN 43* 11/23/2013   CREATININE 2.73* 11/23/2013  Last ACR 38.29 10/19/2012 ( outside tests)  Last eye  exam in 06/2013. [name]. 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  I reviewed pt's medications, allergies, PMH, social hx, family hx and no changes required except as above. She started Uloric, vitamin D 50,000 IU weekly. She was dx with L kidney cancer in 10/2012 >> s/p Bx >> pathology inconclusive >> Bx will be resent to [facility] >> partial nephrectomy in 01/2013.  I reviewed pt's medications, allergies, PMH, social hx, family hx, and changes were documented in the history of present illness. Otherwise, unchanged from my initial visit note.  Review of Systems Constitutional: + weight loss, + fatigue, no hot flushes/chills, no poor sleep, + nocturia Eyes: + blurry vision, no xerophthalmia ENT: no sore throat, no nodules palpated in throat, no dysphagia/ no odynophagia, + tinnitus Cardiovascular: still has CP/SOB/palpitations/leg swelling Respiratory: + cough/+ SOB/+ wheezing Gastrointestinal: no nausea/V/D/C Musculoskeletal: no muscle/+ joint aches Skin: + rash, + itching Neurological: no tremors/numbness/tingling/dizziness  Objective:   Physical Exam BP 112/60 mmHg  Pulse 63  Temp(Src) 97.4 F (36.3 C) (Oral)  Resp 12  Wt 206 lb (93.441 kg)  SpO2 95% Body mass index is 35.34 kg/(m^2).  Wt Readings from Last 3 Encounters:  05/23/14 206 lb (93.441 kg)  03/26/14 213 lb 9.6 oz (96.888 kg)  02/22/14 211  lb 12.8 oz (96.072 kg)   Constitutional: overweight, in NAD Eyes: PERRLA, EOMI, no exophthalmos ENT: moist mucous membranes, no thyromegaly, no cervical lymphadenopathy Cardiovascular: RRR, No MRG Respiratory: CTA B Gastrointestinal: abdomen soft, NT, ND, BS+ Musculoskeletal: no deformities, strength intact in all 4 Skin: moist, warm, no rashes  Assessment:     1. DM2, poorly controlled, insulin-dependent, with complications - CKD stage 3    Plan:  Pt with long-standing insulin-dependent DM2, with the latest hemoglobin A1c increased but  with great improvement in last 2 weeks after she started improving her diet! She lost weight and feels great. She is doing it along with her whole family. She was able to come off Glipizide and will need to decrease insulin doses. - I advised her to: Patient Instructions  Please decrease the Lantus to 15 units in am and 10 units at bedtime. Continue to stay off Glipizide XL.  Please stop at the lab.  Please come back for a follow-up appointment in 3 months with your log.  KEEP UP THE GREAT WORK!!!!  - given more sugar logs - will check a HbA1c today - given her letter for traveling (to take insulin on the plane) - I will see her back in 3 months with her sugar log  Office Visit on 05/23/2014  Component Date Value Ref Range Status  . Hgb A1c MFr Bld 05/23/2014 7.7* 4.6 - 6.5 % Final   Glycemic Control Guidelines for People with Diabetes:Non Diabetic:  <6%Goal of Therapy: <7%Additional Action Suggested:  >8%    The HbA1c is already better!

## 2014-05-29 ENCOUNTER — Telehealth: Payer: Self-pay | Admitting: Internal Medicine

## 2014-05-29 NOTE — Telephone Encounter (Signed)
Patient called and would like to know her labs results    Thank you

## 2014-05-29 NOTE — Telephone Encounter (Signed)
Pt requesting lab results.

## 2014-05-29 NOTE — Telephone Encounter (Signed)
Larene Beach, Please check the chart before sending these messages to me.  Whenever patients have an active my chart, it is likely that I have released them already through there. Also, if they don't have an active my chart, many times you already called them and left a message about this. Thank you, C

## 2014-05-29 NOTE — Telephone Encounter (Signed)
Called pt and lvm advising her per Dr Arman Filter result note in [place]. Advised pt her A1c is 7.7.

## 2014-05-31 ENCOUNTER — Telehealth: Payer: Self-pay | Admitting: Internal Medicine

## 2014-05-31 NOTE — Telephone Encounter (Signed)
Pt called and would like copy  Of lab work results mailed to her

## 2014-05-31 NOTE — Telephone Encounter (Signed)
Done

## 2014-06-07 DIAGNOSIS — N2581 Secondary hyperparathyroidism of renal origin: Secondary | ICD-10-CM | POA: Diagnosis not present

## 2014-06-07 DIAGNOSIS — I129 Hypertensive chronic kidney disease with stage 1 through stage 4 chronic kidney disease, or unspecified chronic kidney disease: Secondary | ICD-10-CM | POA: Diagnosis not present

## 2014-06-07 DIAGNOSIS — N184 Chronic kidney disease, stage 4 (severe): Secondary | ICD-10-CM | POA: Diagnosis not present

## 2014-06-07 DIAGNOSIS — E1122 Type 2 diabetes mellitus with diabetic chronic kidney disease: Secondary | ICD-10-CM | POA: Diagnosis not present

## 2014-06-20 DIAGNOSIS — H2513 Age-related nuclear cataract, bilateral: Secondary | ICD-10-CM | POA: Diagnosis not present

## 2014-06-20 DIAGNOSIS — H25019 Cortical age-related cataract, unspecified eye: Secondary | ICD-10-CM | POA: Diagnosis not present

## 2014-06-20 DIAGNOSIS — H3531 Nonexudative age-related macular degeneration: Secondary | ICD-10-CM | POA: Diagnosis not present

## 2014-06-20 DIAGNOSIS — H35033 Hypertensive retinopathy, bilateral: Secondary | ICD-10-CM | POA: Diagnosis not present

## 2014-07-09 DIAGNOSIS — H2512 Age-related nuclear cataract, left eye: Secondary | ICD-10-CM | POA: Diagnosis not present

## 2014-07-15 ENCOUNTER — Other Ambulatory Visit: Payer: Self-pay

## 2014-08-21 DIAGNOSIS — H25011 Cortical age-related cataract, right eye: Secondary | ICD-10-CM | POA: Diagnosis not present

## 2014-08-21 DIAGNOSIS — H2511 Age-related nuclear cataract, right eye: Secondary | ICD-10-CM | POA: Diagnosis not present

## 2014-08-27 DIAGNOSIS — H2511 Age-related nuclear cataract, right eye: Secondary | ICD-10-CM | POA: Diagnosis not present

## 2014-08-30 ENCOUNTER — Ambulatory Visit: Payer: Medicare Other | Admitting: Internal Medicine

## 2014-09-06 DIAGNOSIS — Z1231 Encounter for screening mammogram for malignant neoplasm of breast: Secondary | ICD-10-CM | POA: Diagnosis not present

## 2014-09-18 DIAGNOSIS — I10 Essential (primary) hypertension: Secondary | ICD-10-CM | POA: Diagnosis not present

## 2014-09-18 DIAGNOSIS — M109 Gout, unspecified: Secondary | ICD-10-CM | POA: Diagnosis not present

## 2014-09-18 DIAGNOSIS — M6281 Muscle weakness (generalized): Secondary | ICD-10-CM | POA: Diagnosis not present

## 2014-09-18 DIAGNOSIS — E119 Type 2 diabetes mellitus without complications: Secondary | ICD-10-CM | POA: Diagnosis not present

## 2014-09-18 DIAGNOSIS — N183 Chronic kidney disease, stage 3 (moderate): Secondary | ICD-10-CM | POA: Diagnosis not present

## 2014-09-18 DIAGNOSIS — J45909 Unspecified asthma, uncomplicated: Secondary | ICD-10-CM | POA: Diagnosis not present

## 2014-10-07 ENCOUNTER — Ambulatory Visit: Payer: Medicare Other | Admitting: Internal Medicine

## 2014-12-17 ENCOUNTER — Emergency Department: Payer: Medicare Other

## 2014-12-17 ENCOUNTER — Emergency Department
Admission: EM | Admit: 2014-12-17 | Discharge: 2014-12-17 | Disposition: A | Discharge status: Home / Self Care | Payer: Medicare Other | Attending: Emergency Medicine | Admitting: Emergency Medicine

## 2014-12-17 DIAGNOSIS — J449 Chronic obstructive pulmonary disease, unspecified: Secondary | ICD-10-CM | POA: Diagnosis not present

## 2014-12-17 DIAGNOSIS — S82831A Other fracture of upper and lower end of right fibula, initial encounter for closed fracture: Secondary | ICD-10-CM | POA: Diagnosis not present

## 2014-12-17 DIAGNOSIS — W010XXA Fall on same level from slipping, tripping and stumbling without subsequent striking against object, initial encounter: Secondary | ICD-10-CM | POA: Diagnosis not present

## 2014-12-17 DIAGNOSIS — K219 Gastro-esophageal reflux disease without esophagitis: Secondary | ICD-10-CM | POA: Diagnosis not present

## 2014-12-17 DIAGNOSIS — M7989 Other specified soft tissue disorders: Secondary | ICD-10-CM | POA: Diagnosis not present

## 2014-12-17 DIAGNOSIS — E1121 Type 2 diabetes mellitus with diabetic nephropathy: Secondary | ICD-10-CM | POA: Diagnosis not present

## 2014-12-17 DIAGNOSIS — Z794 Long term (current) use of insulin: Secondary | ICD-10-CM | POA: Diagnosis not present

## 2014-12-17 DIAGNOSIS — E785 Hyperlipidemia, unspecified: Secondary | ICD-10-CM | POA: Diagnosis not present

## 2014-12-17 DIAGNOSIS — Z8673 Personal history of transient ischemic attack (TIA), and cerebral infarction without residual deficits: Secondary | ICD-10-CM | POA: Diagnosis not present

## 2014-12-17 DIAGNOSIS — I10 Essential (primary) hypertension: Secondary | ICD-10-CM | POA: Diagnosis not present

## 2014-12-17 DIAGNOSIS — J45909 Unspecified asthma, uncomplicated: Secondary | ICD-10-CM | POA: Diagnosis not present

## 2014-12-17 DIAGNOSIS — Y9289 Other specified places as the place of occurrence of the external cause: Secondary | ICD-10-CM | POA: Diagnosis not present

## 2014-12-17 DIAGNOSIS — M79604 Pain in right leg: Secondary | ICD-10-CM | POA: Diagnosis not present

## 2014-12-17 NOTE — ED Provider Notes (Signed)
EMERGENCY DEPARTMENT HISTORY AND PHYSICAL EXAM     Physician/Midlevel provider first contact with patient: 12/17/14 1647         Date: 12/17/2014  Patient Name: Shelby Barron    History of Presenting Illness     Chief Complaint   Patient presents with   . Fall   . Leg Pain       History Provided By: Patient    Chief Complaint: Leg pain s/p fall   Onset: 2 days ago  Timing: Constant  Location: R leg   Quality: Achy   Severity: Moderate  Exacerbating factors: None  Alleviating factors: None  Associated Symptoms: None  Pertinent Negatives: Head injury, LOC    Additional History: Shelby Barron is a 74 y.o. female w/ multiple medical problems presenting to the ED with constant R leg pain and swelling s/p a slip and fall that occurred 2 days ago. She has been ambulating with pain and came to ED to get the leg checked out. No blood thinners. The pt denies head injury or LOC. No pain except in the R leg around the R lateral knee area.    PCP: Clarnce Flock, MD    No current facility-administered medications for this encounter.     Current Outpatient Prescriptions   Medication Sig Dispense Refill   . lisinopril (PRINIVIL,ZESTRIL) 5 MG tablet Take 5 mg by mouth daily.     Marland Kitchen amLODIPine (NORVASC) 10 MG tablet Take 10 mg by mouth daily.     . clopidogrel (PLAVIX) 75 MG tablet Take 75 mg by mouth daily.       . Febuxostat (ULORIC PO) Take by mouth.     . fluticasone-salmeterol (ADVAIR HFA) 115-21 MCG/ACT inhaler Inhale 2 puffs into the lungs as needed.     . furosemide (LASIX) 20 MG tablet Take 20 mg by mouth 2 (two) times daily.       . insulin aspart (NOVOLOG) 100 UNIT/ML injection Inject 5-10 Units into the skin every evening.     . insulin glargine (LANTUS) 100 UNIT/ML injection Inject 20 Units into the skin every 12 (twelve) hours.     . pregabalin (LYRICA) 50 MG capsule Take 50 mg by mouth daily.     . Zolpidem Tartrate (AMBIEN PO) Take by mouth.           Past History     Past Medical History:  Past Medical History    Diagnosis Date   . Hypertension    . CVA (cerebral infarction) Jan 12, 2003   . Hyperlipidemia    . Neuropathy of hand    . Diabetic nephropathy    . H/O: gout    . Acid reflux    . Asthma, well controlled    . SOB (shortness of breath)    . COPD (chronic obstructive pulmonary disease)    . Diabetes    . Fibroids    . Chronic lower back pain    . Gout    . Cancer of kidney        Past Surgical History:  Past Surgical History   Procedure Laterality Date   . Hernia repair     . Appendectomy     . Hysterectomy     . Tumor removed from kidney  cJanuary 2015       Family History:  Family History   Problem Relation Age of Onset   . Cancer Mother    . Heart disease Father    .  Cancer Maternal Aunt    . Diabetes Maternal Aunt    . Cancer Paternal Aunt        Social History:  Social History   Substance Use Topics   . Smoking status: Former Games developer   . Smokeless tobacco: Never Used   . Alcohol Use: No       Allergies:  Allergies   Allergen Reactions   . Sulfa Antibiotics Hives       Review of Systems     Review of Systems   Constitutional: Negative for fever and malaise/fatigue.   HENT: Negative for nosebleeds.    Eyes: Negative for discharge.   Respiratory: Negative for cough and wheezing.    Cardiovascular: Positive for leg swelling.   Gastrointestinal: Negative for vomiting and abdominal pain.   Genitourinary: Negative for flank pain.   Musculoskeletal: Positive for myalgias and falls. Negative for back pain and neck pain.   Skin: Negative for rash.   Neurological: Negative for tingling, sensory change, loss of consciousness and headaches.   Endo/Heme/Allergies: Does not bruise/bleed easily.        Allergy: Sulfa abx   Psychiatric/Behavioral: Negative for suicidal ideas.     Physical Exam   BP 153/70 mmHg  Pulse 67  Temp(Src) 97.9 F (36.6 C) (Oral)  Resp 18  Ht 5\' 3"  (1.6 m)  Wt 94.348 kg  BMI 36.85 kg/m2  SpO2 97%    Constitutional: Vital signs reviewed. Well appearing. Obese. No distress.  Head: Normocephalic,  atraumatic  Eyes: Conjunctiva and sclera are normal.  No injection or discharge.  Ears, Nose, Throat:  Normal external examination of the nose and ears.  Mucous membranes moist.  Neck: Normal range of motion. No midline C-spine tenderness. Trachea midline.  Respiratory/Chest: Clear to auscultation. No respiratory distress.   Cardiovascular: Regular rate and rhythm. No murmurs.  Abdomen:  Bowel sounds intact. No rebound or guarding. Soft.  Non-tender.  Back: No midline CTLS spine tenderness to palpation. Stable pelvis. No hip tenderness.   Upper Extremity:  No edema. No cyanosis. Bilateral radial pulses intact and equal. No focal tenderness.   Lower Extremity:  (+) swelling and tenderness over R proximal tibia. No R knee effusion. No instability to valgus/varus stress. No cyanosis. Bilateral calves symmetrical and non-tender. DP and PT pulses intact and equal in the bilateral lower extremities. No focal tenderness except where measured.  Skin: Warm and dry. No rash.  Neuro: A&Ox3. CNII-XII intact to testing. Moves all extremities spontaneously. Walking with limp due to R leg pain.    Psychiatric:  Normal affect.  Normal insight.      Diagnostic Study Results     Labs -     Results     ** No results found for the last 24 hours. **          Radiologic Studies -   Radiology Results (24 Hour)     Procedure Component Value Units Date/Time    Tibia Fibula Right AP and Lateral [[id number]] Collected:  12/17/14 1759    Order Status:  Completed Updated:  12/17/14 1804    Narrative:      Indication: Right tibia and fibular pain after a fall.    FINDINGS: Right tibia and fibula. Two-view study. Nondisplaced fractures  of the proximal aspect of the fibula. Adjacent ligament calcification.  No other fracture seen. The ankle mortise is intact.      Impression:       Proximal fibular fracture without displacement.  Kinnie Feil, MD   12/17/2014 5:59 PM      Knee 4+ Views Right [[id number]] Collected:  12/17/14 1758    Order  Status:  Completed Updated:  12/17/14 1803    Narrative:      Indication: Right knee pain following fall.    FINDINGS: Right knee. 4 views. Fracture through the proximal fibula. No  joint effusion. Mild narrowing of the medial joint compartment. Vascular  calcifications.      Impression:       Fracture through the proximal aspect of the fibula.    Kinnie Feil, MD   12/17/2014 5:59 PM      US Venous Dopp Right Low Extrem [[id number]] Collected:  12/17/14 1724    Order Status:  Completed Updated:  12/17/14 1729    Narrative:      US VENOUS LOW EXTREMA DUPLEX DOPP UNI RIGHT    CLINICAL INDICATION:  Right lower extremity swelling. Deep venous  thrombosis.    TECHNIQUE:  Duplex venous ultrasound examination of the right lower  extremity was performed.    FINDINGS:  There is good compression, augmentation and flow seen in the  deep veins of the lower extremity. No evidence for occlusive thrombus in  the deep veins. No evidence for nonocclusive venous thrombosis.        Impression:        NO EVIDENCE OF DEEP VENOUS THROMBOSIS IN THE RIGHT LOWER  EXTREMITY.        Darnelle Maffucci, MD   12/17/2014 5:25 PM        .    Medical Decision Making   I am the first provider for this patient.    I reviewed the vital signs, available nursing notes, past medical history, past surgical history, family history and social history.    Vital Signs-Reviewed the patient's vital signs.     Patient Vitals for the past 12 hrs:   BP Temp Pulse Resp   12/17/14 1630 153/70 mmHg 97.9 F (36.6 C) 67 18     Pulse Oximetry Analysis - Normal 97% on RA      Procedures:  ------------------ PROCEDURE: INITIAL FRACTURE CARE  ------------------    Performed by the emergency provider  Consent:  Informed consent, after discussion of the risks, benefits, and alternatives to the procedure, was obtained  Indication: The patient has a fracture of the proximal fibula.     Procedure: The fracture was not displaced and did not require manipulation in the ED.    Assessment: The fracture was appropriately immobilized and was neurovascularly intact at discharge.    ------------------- PROCEDURE: SPLINT APPLICATION  -------------------    Applied by Nurse, supervised by emergency provider.   Location: R knee  Procedure: The area of the splint was appropriately positioned.  A knee immobilizer was applied.   Post-procedure: Good position.  Neurovascular status remains intact.  Patient tolerated the procedure well with no immediate complications    Core Measures:   Patient was offered analgesics for pain control of potential long bone fracture. Patient declines.    Old Medical Records: Old medical records.  Nursing notes.     ED Course:   4:49 PM - Discussed plan for XR and [place]. Pt declines pain medication.     6:25 PM - Discussed imaging results w/ pt and husband. Remains in NAD. Will f/u with ortho next available (referral provided). Reviewed red flags for return to ED and pt voiced understanding.  Provider Notes: Pt well appearing, in NAD. Declined pain meds. Placed in knee immobilizer and given crutches, will f/u with ortho.     Diagnosis     Clinical Impression:   1. Closed fracture of proximal end of right fibula, unspecified fracture morphology, initial encounter        Treatment Plan:   ED Disposition     Discharge Shelby Barron discharge to home/self care.    Condition at disposition: Stable          _______________________________    Attestations: This note is prepared by Andre Lefort acting as scribe for Lynnea Ferrier, MD.    Lynnea Ferrier, MD - The scribe's documentation has been prepared under my direction and personally reviewed by me in its entirety. I confirm that the note above accurately reflects all work, treatment, procedures, and medical decision making performed by me.  _______________________________      Maryella Shivers, MD  12/18/14 [phone]

## 2014-12-17 NOTE — ED Notes (Signed)
Right leg is location of pain,

## 2014-12-17 NOTE — ED Notes (Signed)
See quick triage

## 2014-12-19 DIAGNOSIS — S82831A Other fracture of upper and lower end of right fibula, initial encounter for closed fracture: Secondary | ICD-10-CM | POA: Diagnosis not present

## 2015-01-15 ENCOUNTER — Other Ambulatory Visit: Payer: Self-pay | Admitting: Internal Medicine

## 2015-01-15 DIAGNOSIS — J069 Acute upper respiratory infection, unspecified: Secondary | ICD-10-CM | POA: Diagnosis not present

## 2015-01-15 DIAGNOSIS — M25561 Pain in right knee: Secondary | ICD-10-CM | POA: Diagnosis not present

## 2015-01-15 DIAGNOSIS — M79604 Pain in right leg: Secondary | ICD-10-CM | POA: Diagnosis not present

## 2015-01-19 DIAGNOSIS — G459 Transient cerebral ischemic attack, unspecified: Secondary | ICD-10-CM

## 2015-01-19 HISTORY — DX: Transient cerebral ischemic attack, unspecified: G45.9

## 2015-04-15 ENCOUNTER — Ambulatory Visit: Payer: Medicare Other | Admitting: Internal Medicine

## 2015-05-06 ENCOUNTER — Other Ambulatory Visit: Payer: Self-pay | Admitting: Urology

## 2015-05-06 DIAGNOSIS — C642 Malignant neoplasm of left kidney, except renal pelvis: Secondary | ICD-10-CM

## 2015-05-28 ENCOUNTER — Ambulatory Visit (INDEPENDENT_AMBULATORY_CARE_PROVIDER_SITE_OTHER): Payer: Medicare Other | Admitting: Internal Medicine

## 2015-05-28 ENCOUNTER — Encounter: Payer: Self-pay | Admitting: Internal Medicine

## 2015-05-28 VITALS — BP 120/62 | HR 72 | Ht 63.0 in | Wt 211.0 lb

## 2015-05-28 DIAGNOSIS — J019 Acute sinusitis, unspecified: Secondary | ICD-10-CM

## 2015-05-28 DIAGNOSIS — J309 Allergic rhinitis, unspecified: Secondary | ICD-10-CM | POA: Diagnosis not present

## 2015-05-28 DIAGNOSIS — J4531 Mild persistent asthma with (acute) exacerbation: Secondary | ICD-10-CM | POA: Diagnosis not present

## 2015-05-28 DIAGNOSIS — J3089 Other allergic rhinitis: Secondary | ICD-10-CM

## 2015-05-28 DIAGNOSIS — J302 Other seasonal allergic rhinitis: Secondary | ICD-10-CM

## 2015-05-28 MED ORDER — PHENYLEPHRINE HCL 1 % NA SOLN
3.0000 [drp] | Freq: Once | NASAL | Status: AC
  Administered 2015-05-28: 3 [drp] via NASAL

## 2015-05-28 MED ORDER — ALBUTEROL SULFATE HFA 108 (90 BASE) MCG/ACT IN AERS: INHALATION_SPRAY | RESPIRATORY_TRACT | Status: DC

## 2015-05-28 MED ORDER — METHYLPREDNISOLONE ACETATE 80 MG/ML IJ SUSP
80.0000 mg | Freq: Once | INTRAMUSCULAR | Status: AC
  Administered 2015-05-28: 80 mg via INTRAMUSCULAR

## 2015-05-28 NOTE — Assessment & Plan Note (Signed)
Sustained exacerbation following a flu syndrome during the winter. Plan-finished amoxicillin. Depo-Medrol. Mucinex DM. Stay well hydrated.

## 2015-05-28 NOTE — Patient Instructions (Signed)
Script sent refilling your albuterol rescue inhaler  Neb neo nasal     Dx acute rhinosinusitis  Depo 80  Suggest otc Mucinex-DM   To ease cough and make mucus easier to cough out  Stay well- hydrated

## 2015-05-28 NOTE — Progress Notes (Signed)
02/11/12- 21 yoF former smoker followed for alllergic rhinitis, asthma/ COPD, hx urticaria, complicated by DM, renal insufficiency, GERD           husband here FOLLOWS FOR: having SOB, wheezing, cough-productive-white in color, and chest congestion. Review PFT and 6MW test with patient Had flu like illness without fever, after flu shot, slowly resolving. This week a deep breath hurts midsternum. Reports stridor with increased physical exertion. PFT 10/26/2011 mild obstructive airways disease, small airways, insignificant response to bronchodilator. Diffusion moderately reduced. FVC 2.46/92%, FEV1 1.76/94%, FEV1/FVC 0.71, FEF 25/75% 1.13/52%. TLC 86%, DLCO 46%. 6 MWT-99%, 97%, 99%, 297.5 m with complaint of right calf pain but no oxygen limitation. CXR 10/06/11 IMPRESSION:  No acute abnormalities.  Original Report Authenticated By: Burnetta Sabin, M.D.  03/29/12- 89 yoF former smoker followed for alllergic rhinitis, asthma/ COPD, hx urticaria, complicated by DM, renal insufficiency, GERD         Son here.   ACUTE VISIT: still having trouble breathing; still having soreness in chest that raidates to shoulders and back-pulls from sore spot when sneezing. Also, back of tongue not helping with swallowing food as it should; jaw tires easily. Head congestion;-no color when blowing nose.  At last visit we had given Z-Pak and cough syrup. That may have helped at the time. Her left upper anterior chest wall sore spot persists, increased by sneezing or lifting her left arm which causes sharp pain across anterior chest. No trauma. Chest CT showed no obvious cause. She does not describe anything anginal. Now with acute upper respiratory complaints of right ear pain and nasal congestion. She restarted Nasonex 3 days ago. CT chest 02/25/12- images reviewed with her IMPRESSION:  1. No acute findings in the thorax to account for the patient's  symptoms.  2. Mild diffuse bronchial wall thickening with moderate   centrilobular emphysema; imaging findings compatible with  underlying COPD.  3. Atherosclerosis, including left main and three-vessel coronary  artery disease. Assessment for potential risk factor  modification, dietary therapy or pharmacologic therapy may be  warranted, if clinically indicated.  4. No evidence of substernal thyroid.  5. Small hiatal hernia.  6. Additional incidental findings, as above.  Original Report Authenticated By: Vinnie Langton, M.D.  05/09/12- 89 yoF former smoker followed for alllergic rhinitis, asthma/ COPD, hx urticaria, complicated by DM, renal insufficiency, GERD         Son here Head congestion began last night, caught a cold from husband. Increasing cough with near tussive syncope. Sharp tussive pains are still present left upper anterior chest. Possible fever x1 day with yellow phlegm. She likes the way Dymista nasal spray work but didn't like the taste.  03/26/14-  49 yoF former smoker followed for alllergic rhinitis, asthma/ COPD, hx urticaria, complicated by hx kidney cancer, DM, GERD, hx CVA ACUTE VISIT: 10-2013 had episode of runny nose and ear issues-was given abx and cough syrup--did not get better-week later face started dropping down-was sent to ER for hx of stroke> Bells Palsy. Ears were washed  -was given Rx for ears. Pt is currently having  runny nose and non productive cough.       Husband here Clarification- CVA 2004.MRI brain 2015. Kidney cancer/ partial nephrectomy 2015. Has seen 2 ENT elsewhere. Asks for local referral for R fungal otitis with itching with tinnitus and R eustachian discomfort being Rx'd with clotrimazole oint.  Breathing ok w/o wheeze/  05/28/2015-75 year old female former smoker followed for allergic rhinitis, asthma/COPD, history urticaria, complicated  by kidney cancer, DM, GERD, history CVA             husband here FOLLOWS FOR: States that she has been "congested" for the few months. Saw her PCP last week and was given  Ampicillin. Congestion has moved to her chest. Had flu syndrome in January. Since then had persistent head congestion, nasal stuffiness, cough treated by PCP. Symptoms never completely resolved. Now stuffy nose is moving into chest with cough, little sputum. Has been on amoxicillin for 7 of 10 days. Marland Kitchen ROS- see HP. Constitutional:   No-   weight loss, night sweats, fevers, chills, fatigue, lassitude. HEENT:   No-  headaches, difficulty swallowing, tooth/dental problems, sore throat,       No-  sneezing, itching, ear ache, +nasal congestion, post nasal drip,  CV:  noncardiac chest pain, no-orthopnea, PND, swelling in lower extremities, anasarca,                                       dizziness, No-palpitations Resp: + shortness of breath with exertion or at rest.                +productive cough,  No non-productive cough,  No-  coughing up of blood.               change in color of mucus.  No- wheezing.   Skin: No-   rash or lesions. GI:  No-   heartburn, indigestion, abdominal pain, nausea, vomiting,  GU: MS:  +  joint pain or swelling.  . Neuro- nothing unusual Psych:  No- change in mood or affect. No depression or anxiety.  No memory loss.  OBJ General- Alert, Oriented, Affect-appropriate, Distress- none acute, Obese, talkative . Wearing a paper facemask today .  Skin- rash-none, lesions- none, excoriation- none Lymphadenopathy- none Head- atraumatic            Eyes- Gross vision intact, PERRLA, conjunctivae clear secretions            Ears- hearing intact            Nose- +turbinate edema, no-Septal dev, +mucus bridging, no-polyps, erosion, perforation             Throat- Mallampati III , mucosa clear , drainage- none, tonsils- atrophic, + mild hoarseness Neck- flexible , trachea midline, no stridor , thyroid nl, carotid no bruit Chest - symmetrical excursion , unlabored           Heart/CV- RRR , no murmur , no gallop  , no rub, nl s1 s2                           - JVD- none , +trace  edema, no-stasis changes- none, varices- none           Lung- clear to P&A, wheeze-none, cough- none , dullness-none, rub- none           Chest wall-  Abd-  Br/ Gen/ Rectal- Not done, not indicated Extrem- cyanosis- none, clubbing, none, atrophy- none, strength- nl. these Neuro- grossly intact to observation

## 2015-05-28 NOTE — Assessment & Plan Note (Signed)
Probably overlapped now between viral rhinitis/bronchitis syndrome and spring pollen. Plan-Depo-Medrol, Mucinex DM, nasal nebulizer decongestant

## 2015-05-29 ENCOUNTER — Ambulatory Visit: Payer: Medicare Other | Admitting: Gastroenterology

## 2015-06-17 ENCOUNTER — Telehealth: Payer: Self-pay | Admitting: Internal Medicine

## 2015-06-17 MED ORDER — INSULIN GLARGINE 100 UNIT/ML SOLOSTAR PEN: PEN_INJECTOR | SUBCUTANEOUS | Status: DC

## 2015-06-17 NOTE — Telephone Encounter (Signed)
PT needs Lantus called into [facility] on [address]. [employer]

## 2015-06-17 NOTE — Telephone Encounter (Signed)
Please refill for 1 month. She is missing too many appointments. She needs to schedule another appointment but I will need to discharge her if she is not showing up.

## 2015-06-17 NOTE — Telephone Encounter (Signed)
Pt not seen since 05/23/2014. Please advise about refilling Lantus?

## 2015-07-15 ENCOUNTER — Encounter: Payer: Self-pay | Admitting: Internal Medicine

## 2015-07-15 ENCOUNTER — Ambulatory Visit (INDEPENDENT_AMBULATORY_CARE_PROVIDER_SITE_OTHER): Payer: Medicare Other | Admitting: Internal Medicine

## 2015-07-15 VITALS — BP 124/74 | HR 65 | Ht 63.0 in | Wt 216.0 lb

## 2015-07-15 DIAGNOSIS — Z794 Long term (current) use of insulin: Secondary | ICD-10-CM

## 2015-07-15 DIAGNOSIS — E1121 Type 2 diabetes mellitus with diabetic nephropathy: Secondary | ICD-10-CM | POA: Diagnosis not present

## 2015-07-15 MED ORDER — INSULIN GLARGINE 100 UNIT/ML SOLOSTAR PEN: PEN_INJECTOR | SUBCUTANEOUS | Status: DC

## 2015-07-15 MED ORDER — GLIPIZIDE ER 5 MG PO TB24: 5.0000 mg | ORAL_TABLET | Freq: Every day | ORAL | Status: DC

## 2015-07-15 MED ORDER — INSULIN ASPART 100 UNIT/ML ~~LOC~~ SOLN: 6.0000 [IU] | Freq: Every day | SUBCUTANEOUS | Status: DC

## 2015-07-15 NOTE — Patient Instructions (Signed)
Please continue Lantus 20 units in am and 20 units at bedtime.  Start Glipizide XL 5 mg daily in am before b'fast.  Continue Novolog but start taking 6 units before dinner, rather than on a sliding scale.  Please come back for a follow-up appointment in 3 months with your log.

## 2015-07-15 NOTE — Progress Notes (Signed)
Subjective:     Patient ID: Megan [name], female   DOB: [birth year], 75 y.o.   MRN: [id number]  Diabetes  Megan [name] is a pleasant 75 y.o. woman returning for f/u for DM2, dx 1995, insulin-dependent, uncontrolled, with complications (chronic kidney disease). She is here with her husband. Last visit >1 year ago (!).  Since last visit, her husband (who is also my pt) was dx'ed with prostate cancer and she also had a URI >> sugars worse.   Latest HbA1C: 06/30/2015: HbA1c 8.5% - Dr Ginette Pitman 05/17/2015: HbA1c 9.3% - Dr. Juleen China Lab Results  Component Value Date   HGBA1C 7.7* 05/23/2014   HGBA1C 8.6* 02/22/2014   HGBA1C 6.6* 09/27/2012  09/24/2013: HbA1c 7.3%  She takes: - Lantus 20 units in am and 20 units in hs - Novolog: 2-6 units, target 200, ISF 50 (at bedtime) Stopped Glipizide XL 5 mg daily b/c lows She stopped Januvia.  She was previously on Invokana 100 mg, but we need to stop it due to CKD. She was previously on Metformin and that helped tremendously - but had to be taken off Metformin b/c CKD.  She took Welchol in the past, now off.  Her sugars were much better in the past on a Paleo diet, but she is now off the diet.  She checks sugars, but did not bring a sugar log or meter: - am: 100 -125 >> 83-low 100s >> 86-100 >> 87-106 >> 65-105 >> 145-190 - 2h after b'fast: 137 >> 108-145, 210 >> n/c - before lunch: 119-132 >> 150s >> n/c >> 80-100 >> 137, 139 >> 98-121, 169 >> n/c - 2h after lunch: 147 and 256 >> n/c >> 127-200, 220 >> 143-220 >> 108-128 >> n/c - before dinner: 99-102 >> 125-150 >> n/c >> 103-154 >> n/c >> 102-123 >> n/c - 2h after dinner: now 118-211 >> not checking >> n/c >> 107-168 n/c - hs: 240 >> 200-235 >> 118-227 >> 180s as she grazes >> 122-160 >> 191-312 >> 75-145, 151 >> 150-350 No lows, has hypoglycemia awareness.   Eats 2 main meals a day and snacks a lot. "I eat everything under the sun".   - No neuropathy.  - Has CKD stage 3-4. Last BUN/Cr (per  records from Dr. Juleen China - [facility]): 06/30/2015: BUN 35, Cr 1.9, EGFR 31, glucose 129 01/29/2014: BUN 39, Cr 2.14 09/24/2013: BUN 54, Cr 3.01 10/19/2012: BUN 34, Cr 1.8, PTH 180, TSH 0.91 09/27/2012: BUN: 78, Cr 2.8  Lab Results  Component Value Date   BUN 43* 11/23/2013   CREATININE 2.73* 11/23/2013   Last eye exam in 06/2013. [name]. 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  I reviewed pt's medications, allergies, PMH, social hx, family hx and no changes required except as above. She started Uloric, vitamin D 50,000 IU weekly. She was dx with L kidney cancer in 10/2012 >> s/p Bx >> pathology inconclusive >> Bx will be [facility] to [facility] >> partial nephrectomy in 01/2013.  I reviewed pt's medications, allergies, PMH, social hx, family hx, and changes were documented in the history of present illness. Otherwise, unchanged from my initial visit note.  Review of Systems Constitutional: + weight gain, + fatigue, no hot flushes/chills, no poor sleep, + nocturia Eyes: no blurry vision, no xerophthalmia ENT: no sore throat, no nodules palpated in throat, no dysphagia/ no odynophagia, + tinnitus Cardiovascular: still has CP/SOB/palpitations/+ leg swelling Respiratory: + cough/+ SOB/+ wheezing Gastrointestinal: no nausea/V/D/C Musculoskeletal: + muscle/+ joint  aches Skin: no rash, + itching Neurological: no tremors/numbness/tingling/dizziness  Objective:   Physical Exam BP 124/74 mmHg  Pulse 65  Ht _0  (1.6 m)  Wt 216 lb (97.977 kg)  BMI 38.27 kg/m2  SpO2 95% Body mass index is 38.27 kg/(m^2).  Wt Readings from Last 3 Encounters:  07/15/15 216 lb (97.977 kg)  05/28/15 211 lb (95.709 kg)  05/23/14 206 lb (93.441 kg)   Constitutional: overweight, in NAD Eyes: PERRLA, EOMI, no exophthalmos ENT: moist mucous membranes, no thyromegaly, no cervical lymphadenopathy Cardiovascular: RRR, No MRG Respiratory: CTA B Gastrointestinal: abdomen soft, NT, ND,  BS+ Musculoskeletal: no deformities, strength intact in all 4 Skin: moist, warm, no rashes  Assessment:     1. DM2, poorly controlled, insulin-dependent, with complications - CKD stage 3    Plan:  Pt with long-standing insulin-dependent DM2, Uncontrolled, with higher sugars after she stopped the paleo diet. She is also not compliant with her appointments, with last visit being more than a year ago. - Reviewed her previous HbA1c, obtained by her nephrologist, which was higher, at 9.3 %. A repeat this mo: 8.5%. - I advised her to: Patient Instructions  Please continue Lantus 20 units in am and 20 units at bedtime.  Start Glipizide XL 5 mg daily in am before b'fast.  Continue Novolog but start taking 6 units before dinner, rather than on a sliding scale.  Please come back for a follow-up appointment in 3 months with your log.  - I will see her back in 3 months with her sugar log

## 2015-07-21 ENCOUNTER — Encounter: Payer: Self-pay | Admitting: *Deleted

## 2015-07-23 ENCOUNTER — Encounter
Admission: RE | Disposition: A | Discharge status: Home / Self Care | Payer: Self-pay | Source: Ambulatory Visit | Attending: Unknown Physician Specialty

## 2015-07-23 ENCOUNTER — Ambulatory Visit: Payer: Medicare Other | Admitting: Anesthesiology

## 2015-07-23 ENCOUNTER — Ambulatory Visit
Admission: RE | Admit: 2015-07-23 | Discharge: 2015-07-23 | Disposition: A | Discharge status: Home / Self Care | Payer: Medicare Other | Source: Ambulatory Visit | Attending: Unknown Physician Specialty | Admitting: Unknown Physician Specialty

## 2015-07-23 ENCOUNTER — Encounter: Payer: Self-pay | Admitting: Anesthesiology

## 2015-07-23 DIAGNOSIS — K640 First degree hemorrhoids: Secondary | ICD-10-CM | POA: Diagnosis not present

## 2015-07-23 DIAGNOSIS — K222 Esophageal obstruction: Secondary | ICD-10-CM | POA: Diagnosis not present

## 2015-07-23 DIAGNOSIS — J449 Chronic obstructive pulmonary disease, unspecified: Secondary | ICD-10-CM | POA: Diagnosis not present

## 2015-07-23 DIAGNOSIS — Z91041 Radiographic dye allergy status: Secondary | ICD-10-CM | POA: Insufficient documentation

## 2015-07-23 DIAGNOSIS — Z882 Allergy status to sulfonamides status: Secondary | ICD-10-CM | POA: Insufficient documentation

## 2015-07-23 DIAGNOSIS — Z9889 Other specified postprocedural states: Secondary | ICD-10-CM | POA: Diagnosis not present

## 2015-07-23 DIAGNOSIS — Z833 Family history of diabetes mellitus: Secondary | ICD-10-CM | POA: Diagnosis not present

## 2015-07-23 DIAGNOSIS — Z90710 Acquired absence of both cervix and uterus: Secondary | ICD-10-CM | POA: Insufficient documentation

## 2015-07-23 DIAGNOSIS — K5750 Diverticulosis of both small and large intestine without perforation or abscess without bleeding: Secondary | ICD-10-CM | POA: Diagnosis not present

## 2015-07-23 DIAGNOSIS — Z888 Allergy status to other drugs, medicaments and biological substances status: Secondary | ICD-10-CM | POA: Diagnosis not present

## 2015-07-23 DIAGNOSIS — K760 Fatty (change of) liver, not elsewhere classified: Secondary | ICD-10-CM | POA: Diagnosis not present

## 2015-07-23 DIAGNOSIS — I10 Essential (primary) hypertension: Secondary | ICD-10-CM | POA: Diagnosis not present

## 2015-07-23 DIAGNOSIS — M109 Gout, unspecified: Secondary | ICD-10-CM | POA: Diagnosis not present

## 2015-07-23 DIAGNOSIS — Z800 Family history of malignant neoplasm of digestive organs: Secondary | ICD-10-CM | POA: Diagnosis not present

## 2015-07-23 DIAGNOSIS — K259 Gastric ulcer, unspecified as acute or chronic, without hemorrhage or perforation: Secondary | ICD-10-CM | POA: Insufficient documentation

## 2015-07-23 DIAGNOSIS — Z8249 Family history of ischemic heart disease and other diseases of the circulatory system: Secondary | ICD-10-CM | POA: Insufficient documentation

## 2015-07-23 DIAGNOSIS — Z87448 Personal history of other diseases of urinary system: Secondary | ICD-10-CM | POA: Diagnosis not present

## 2015-07-23 DIAGNOSIS — K429 Umbilical hernia without obstruction or gangrene: Secondary | ICD-10-CM | POA: Insufficient documentation

## 2015-07-23 DIAGNOSIS — D123 Benign neoplasm of transverse colon: Secondary | ICD-10-CM | POA: Insufficient documentation

## 2015-07-23 DIAGNOSIS — Z87891 Personal history of nicotine dependence: Secondary | ICD-10-CM | POA: Insufficient documentation

## 2015-07-23 DIAGNOSIS — Z8619 Personal history of other infectious and parasitic diseases: Secondary | ICD-10-CM | POA: Diagnosis not present

## 2015-07-23 DIAGNOSIS — K5900 Constipation, unspecified: Secondary | ICD-10-CM | POA: Insufficient documentation

## 2015-07-23 DIAGNOSIS — K319 Disease of stomach and duodenum, unspecified: Secondary | ICD-10-CM | POA: Diagnosis not present

## 2015-07-23 DIAGNOSIS — Z79899 Other long term (current) drug therapy: Secondary | ICD-10-CM | POA: Insufficient documentation

## 2015-07-23 DIAGNOSIS — K219 Gastro-esophageal reflux disease without esophagitis: Secondary | ICD-10-CM | POA: Diagnosis not present

## 2015-07-23 DIAGNOSIS — Z7902 Long term (current) use of antithrombotics/antiplatelets: Secondary | ICD-10-CM | POA: Insufficient documentation

## 2015-07-23 DIAGNOSIS — R1310 Dysphagia, unspecified: Secondary | ICD-10-CM | POA: Diagnosis present

## 2015-07-23 DIAGNOSIS — R109 Unspecified abdominal pain: Secondary | ICD-10-CM | POA: Diagnosis not present

## 2015-07-23 DIAGNOSIS — K2970 Gastritis, unspecified, without bleeding: Secondary | ICD-10-CM | POA: Insufficient documentation

## 2015-07-23 DIAGNOSIS — Z86010 Personal history of colonic polyps: Secondary | ICD-10-CM | POA: Diagnosis present

## 2015-07-23 DIAGNOSIS — E785 Hyperlipidemia, unspecified: Secondary | ICD-10-CM | POA: Diagnosis not present

## 2015-07-23 DIAGNOSIS — E119 Type 2 diabetes mellitus without complications: Secondary | ICD-10-CM | POA: Insufficient documentation

## 2015-07-23 DIAGNOSIS — D122 Benign neoplasm of ascending colon: Secondary | ICD-10-CM | POA: Insufficient documentation

## 2015-07-23 DIAGNOSIS — D125 Benign neoplasm of sigmoid colon: Secondary | ICD-10-CM | POA: Insufficient documentation

## 2015-07-23 DIAGNOSIS — Z794 Long term (current) use of insulin: Secondary | ICD-10-CM | POA: Insufficient documentation

## 2015-07-23 DIAGNOSIS — Z8041 Family history of malignant neoplasm of ovary: Secondary | ICD-10-CM | POA: Diagnosis not present

## 2015-07-23 DIAGNOSIS — Z1211 Encounter for screening for malignant neoplasm of colon: Secondary | ICD-10-CM | POA: Insufficient documentation

## 2015-07-23 DIAGNOSIS — Z8673 Personal history of transient ischemic attack (TIA), and cerebral infarction without residual deficits: Secondary | ICD-10-CM | POA: Diagnosis not present

## 2015-07-23 HISTORY — PX: COLONOSCOPY WITH PROPOFOL: SHX5780

## 2015-07-23 HISTORY — PX: ESOPHAGOGASTRODUODENOSCOPY (EGD) WITH PROPOFOL: SHX5813

## 2015-07-23 LAB — GLUCOSE, CAPILLARY: Glucose-Capillary: 98 mg/dL (ref 65–99)

## 2015-07-23 SURGERY — COLONOSCOPY WITH PROPOFOL
Anesthesia: General

## 2015-07-23 MED ORDER — MIDAZOLAM HCL 2 MG/2ML IJ SOLN
INTRAMUSCULAR | Status: DC | PRN
  Administered 2015-07-23: 1 mg via INTRAVENOUS

## 2015-07-23 MED ORDER — PHENYLEPHRINE HCL 10 MG/ML IJ SOLN
INTRAMUSCULAR | Status: DC | PRN
  Administered 2015-07-23: 25 ug via INTRAVENOUS

## 2015-07-23 MED ORDER — LIDOCAINE HCL (CARDIAC) 20 MG/ML IV SOLN
INTRAVENOUS | Status: DC | PRN
  Administered 2015-07-23: 30 mg via INTRAVENOUS

## 2015-07-23 MED ORDER — SODIUM CHLORIDE 0.9 % IV SOLN
INTRAVENOUS | Status: DC
  Administered 2015-07-23: 1000 mL via INTRAVENOUS

## 2015-07-23 MED ORDER — FENTANYL CITRATE (PF) 100 MCG/2ML IJ SOLN
INTRAMUSCULAR | Status: DC | PRN
  Administered 2015-07-23: 50 ug via INTRAVENOUS

## 2015-07-23 MED ORDER — SODIUM CHLORIDE 0.9 % IV SOLN: INTRAVENOUS | Status: DC

## 2015-07-23 MED ORDER — PROPOFOL 500 MG/50ML IV EMUL
INTRAVENOUS | Status: DC | PRN
  Administered 2015-07-23: 120 ug/kg/min via INTRAVENOUS

## 2015-07-23 NOTE — H&P (Signed)
Primary Care Physician:  Tracie Harrier, MD Primary Gastroenterologist:  Dr. Vira Agar  Pre-Procedure History & Physical: HPI:  Megan [name] is a 75 y.o. female is here for an endoscopy and colonoscopy.   Past Medical History  Diagnosis Date  . History of CVA (cerebrovascular accident) 2004  . Loss of smell 2004    due to CVA  . Gastritis   . Umbilical hernia   . Esophageal stricture   . History of chickenpox   . Allergic rhinitis   . Hypertension   . Gout   . GERD (gastroesophageal reflux disease)   . Diverticulosis of colon   . Diabetes mellitus type II   . Adenomatous colon polyp   . Asthma   . Renal insufficiency   . Hyperlipidemia   . Fatty liver   . Periumbilical hernia   . COPD (chronic obstructive pulmonary disease) ([place])   . Internal hemorrhoids   . Kidney tumor 11/2012    Past Surgical History  Procedure Laterality Date  . Appendectomy    . Abdominal hysterectomy    . Hemorroidectomy    . Cesarean section      x 2  . Abdominal hysterectomy      Prior to Admission medications   Medication Sig Start Date End Date Taking? Authorizing Provider  albuterol (PROVENTIL HFA;VENTOLIN HFA) 108 (90 Base) MCG/ACT inhaler 2 puffs every 6 hours as needed for wheeze/ cough 05/28/15  Yes Clinton D Young, MD  amLODipine (NORVASC) 10 MG tablet Take 1 tablet by mouth daily. 02/21/12  Yes Historical Provider, MD  atorvastatin (LIPITOR) 40 MG tablet Take 40 mg by mouth daily.   Yes Historical Provider, MD  dicyclomine (BENTYL) 10 MG capsule Take 1 capsule (10 mg total) by mouth every morning. Take 10 mg po BID for diarrhea, urgency 12/21/12  Yes Amy S Esterwood, PA-C  ergocalciferol (VITAMIN D2) 50000 UNITS capsule Take 50,000 Units by mouth every 30 (thirty) days.   Yes Historical Provider, MD  furosemide (LASIX) 40 MG tablet Take 1 tablet by mouth daily. 02/21/12  Yes Historical Provider, MD  glipiZIDE (GLUCOTROL XL) 5 MG 24 hr tablet Take 1 tablet (5 mg total) by mouth daily  with breakfast. 07/15/15  Yes Philemon Kingdom, MD  glucose blood (ONE TOUCH ULTRA TEST) test strip USE tID 05/22/12  Yes Philemon Kingdom, MD  insulin aspart (NOVOLOG) 100 UNIT/ML injection Inject 6 Units into the skin daily before supper. Pens 07/15/15  Yes Philemon Kingdom, MD  Insulin Glargine (LANTUS SOLOSTAR) 100 UNIT/ML Solostar Pen INJECT 20 UNITS IN THE MORNING AND 20 UNITS AT BEDTIME. 07/15/15  Yes Philemon Kingdom, MD  Insulin Pen Needle (PEN NEEDLES 31GX5/16") 31G X 8 MM MISC Use bid 05/22/12  Yes Philemon Kingdom, MD  lisinopril (PRINIVIL,ZESTRIL) 5 MG tablet Take 5 mg by mouth daily. Reported on 07/15/2015   Yes Historical Provider, MD  losartan (COZAAR) 25 MG tablet Take by mouth. 06/12/15 06/11/16 Yes Historical Provider, MD  PLAVIX 75 MG tablet TAKE 1 TABLET BY MOUTH ONCE A DAY 07/28/12  Yes Janith Lima, MD  polyethylene glycol powder (GLYCOLAX/MIRALAX) powder Take by mouth. 05/28/15  Yes Historical Provider, MD  pregabalin (LYRICA) 50 MG capsule Take 1 capsule (50 mg total) by mouth as needed. Patient taking differently: Take 50 mg by mouth daily.  10/24/12  Yes Janith Lima, MD  ULORIC 40 MG tablet Take 80 mg by mouth daily.  11/15/12  Yes Historical Provider, MD  zolpidem (AMBIEN) 5 MG tablet TAKE  1 TABLET BY MOUTH AT BEDTIME AS NEEDED 07/20/12  Yes Janith Lima, MD  famotidine (PEPCID) 40 MG tablet Take 40 mg by mouth daily. Reported on 07/15/2015    Historical Provider, MD    Allergies as of 06/27/2015 - Review Complete 05/28/2015  Allergen Reaction Noted  . Crestor [rosuvastatin]  06/18/2012  . Ivp dye [iodinated diagnostic agents] Other (See Comments) 04/17/2012  . Pioglitazone  12/24/2009  . Sulfonamide derivatives      Family History  Problem Relation Age of Onset  . GER disease Father   . Heart failure Father   . Ovarian cancer Mother   . Colon cancer      aunt  . Colon polyps Father   . Diabetes    . CAD Neg Hx   . Colon polyps      aunt    Social History    Social History  . Marital Status: Married    Spouse Name: N/A  . Number of Children: 2  . Years of Education: N/A   Occupational History  . Retired Pharmacist, hospital   .     Social History Main Topics  . Smoking status: Former Smoker -- 1.00 packs/day for 30 years    Types: Cigarettes    Quit date: 01/18/2002  . Smokeless tobacco: Never Used  . Alcohol Use: No  . Drug Use: No  . Sexual Activity: No   Other Topics Concern  . Not on file   Social History Narrative    Review of Systems: See HPI, otherwise negative ROS  Physical Exam: BP 168/67 mmHg  Pulse 66  Temp(Src) 97.4 F (36.3 C) (Tympanic)  Resp 16  Ht 5\' 3"  (1.6 m)  Wt 96.163 kg (212 lb)  BMI 37.56 kg/m2  SpO2 100% General:   Alert,  pleasant and cooperative in NAD Head:  Normocephalic and atraumatic. Neck:  Supple; no masses or thyromegaly. Lungs:  Clear throughout to auscultation.    Heart:  Regular rate and rhythm. Abdomen:  Soft, nontender and nondistended. Normal bowel sounds, without guarding, and without rebound.  Mild tender in LUQ. Neurologic:  Alert and  oriented x4;  grossly normal neurologically.  Impression/Plan: [name] is here for an endoscopy and colonoscopy to be performed for dysphagia, abdominal pain, PH colon polyps  Risks, benefits, limitations, and alternatives regarding  endoscopy and colonoscopy have been reviewed with the patient.  Questions have been answered.  All parties agreeable.   Gaylyn Cheers, MD  07/23/2015, 7:46 AM

## 2015-07-23 NOTE — Anesthesia Postprocedure Evaluation (Signed)
Anesthesia Post Note  Patient: Megan [name]  Procedure(s) Performed: Procedure(s) (LRB): COLONOSCOPY WITH PROPOFOL (N/A) ESOPHAGOGASTRODUODENOSCOPY (EGD) WITH PROPOFOL (N/A)  Patient location during evaluation: Endoscopy Anesthesia Type: General Level of consciousness: awake and alert Pain management: pain level controlled Vital Signs Assessment: post-procedure vital signs reviewed and stable Respiratory status: spontaneous breathing and respiratory function stable Cardiovascular status: stable Anesthetic complications: no    Last Vitals:  Filed Vitals:   07/23/15 0913 07/23/15 0923  BP:  150/59  Pulse: 67 58  Temp:    Resp: 17 15    Last Pain:  Filed Vitals:   07/23/15 0924  PainSc: 4                  [name]

## 2015-07-23 NOTE — Op Note (Signed)
[facility] Patient Name: Megan [name] Procedure Date: 07/23/2015 7:48 AM MRN: [id number] Account #: [id number] Date of Birth: [birth year] Admit Type: Outpatient Age: 75 Room: [facility] ENDO ROOM 4 Gender: Female Note Status: Finalized Procedure:            Colonoscopy Indications:          High risk colon cancer surveillance: Personal history                        of colonic polyps Providers:            Manya Silvas, MD Referring MD:         Tracie Harrier, MD (Referring MD) Medicines:            Propofol per Anesthesia Complications:        No immediate complications. Procedure:            Pre-Anesthesia Assessment:                       - After reviewing the risks and benefits, the patient                        was deemed in satisfactory condition to undergo the                        procedure.                       After obtaining informed consent, the colonoscope was                        passed under direct vision. Throughout the procedure,                        the patient's blood pressure, pulse, and oxygen                        saturations were monitored continuously. The                        Colonoscope was introduced through the anus and                        advanced to the the cecum, identified by appendiceal                        orifice and ileocecal valve. The colonoscopy was                        somewhat difficult due to a tortuous colon. Successful                        completion of the procedure was aided by applying                        abdominal pressure. The patient tolerated the procedure                        well. The quality of the bowel preparation was adequate  to identify polyps. Findings:      The sigmoid colon was somewhat tortuous.      Five sessile polyps were found in the sigmoid colon, transverse colon       and ascending colon. The polyps were diminutive in size. These  polyps       were removed with a jumbo cold forceps. Resection and retrieval were       complete.      Multiple small-mouthed diverticula were found in the sigmoid colon,       descending colon, transverse colon and ascending colon.      Internal hemorrhoids were found during endoscopy. The hemorrhoids were       small and Grade I (internal hemorrhoids that do not prolapse).      The exam was otherwise without abnormality. Impression:           - Five diminutive polyps in the sigmoid colon, in the                        transverse colon and in the ascending colon, removed                        with a jumbo cold forceps. Resected and retrieved.                       - Diverticulosis in the sigmoid colon, in the                        descending colon, in the transverse colon and in the                        ascending colon.                       - Internal hemorrhoids.                       - The examination was otherwise normal. Recommendation:       - Await pathology results. Manya Silvas, MD 07/23/2015 8:36:42 AM This report has been signed electronically. Number of Addenda: 0 Note Initiated On: 07/23/2015 7:48 AM Scope Withdrawal Time: 0 hours 15 minutes 55 seconds  Total Procedure Duration: 0 hours 22 minutes 58 seconds       [facility]

## 2015-07-23 NOTE — Transfer of Care (Signed)
Immediate Anesthesia Transfer of Care Note  Patient: Megan [name]  Procedure(s) Performed: Procedure(s): COLONOSCOPY WITH PROPOFOL (N/A) ESOPHAGOGASTRODUODENOSCOPY (EGD) WITH PROPOFOL (N/A)  Patient Location: PACU  Anesthesia Type:General  Level of Consciousness: awake and sedated  Airway & Oxygen Therapy: Patient Spontanous Breathing and Patient connected to nasal cannula oxygen  Post-op Assessment: Report given to RN and Post -op Vital signs reviewed and stable  Post vital signs: Reviewed and stable  Last Vitals:  Filed Vitals:   07/23/15 0703  BP: 168/67  Pulse: 66  Temp: 36.3 C  Resp: 16    Last Pain:  Filed Vitals:   07/23/15 0709  PainSc: 4          Complications: No apparent anesthesia complications

## 2015-07-23 NOTE — Anesthesia Procedure Notes (Signed)
Performed by: COOK-MARTIN, Madisson Kulaga Pre-anesthesia Checklist: Patient identified, Emergency Drugs available, Suction available, Patient being monitored and Timeout performed Patient Re-evaluated:Patient Re-evaluated prior to inductionOxygen Delivery Method: Nasal cannula Preoxygenation: Pre-oxygenation with 100% oxygen Intubation Type: IV induction Airway Equipment and Method: Bite block Placement Confirmation: CO2 detector and positive ETCO2     

## 2015-07-23 NOTE — Anesthesia Preprocedure Evaluation (Signed)
Anesthesia Evaluation  Patient identified by MRN, date of birth, ID band Patient awake    Reviewed: Allergy & Precautions, NPO status , Patient's Chart, lab work & pertinent test results  History of Anesthesia Complications Negative for: history of anesthetic complications  Airway Mallampati: III       Dental   Pulmonary neg pulmonary ROS, asthma , COPD,  COPD inhaler, former smoker,           Cardiovascular hypertension, Pt. on medications      Neuro/Psych CVA (r sided weakness), Residual Symptoms negative neurological ROS     GI/Hepatic Neg liver ROS, GERD  Medicated and Poorly Controlled,  Endo/Other  diabetes, Type 2, Oral Hypoglycemic Agents  Renal/GU Renal Insufficiency     Musculoskeletal   Abdominal   Peds  Hematology negative hematology ROS (+)   Anesthesia Other Findings   Reproductive/Obstetrics                             Anesthesia Physical Anesthesia Plan  ASA: III  Anesthesia Plan: General   Post-op Pain Management:    Induction: Intravenous  Airway Management Planned: Nasal Cannula  Additional Equipment:   Intra-op Plan:   Post-operative Plan:   Informed Consent: I have reviewed the patients History and Physical, chart, labs and discussed the procedure including the risks, benefits and alternatives for the proposed anesthesia with the patient or authorized representative who has indicated his/her understanding and acceptance.     Plan Discussed with:   Anesthesia Plan Comments:         Anesthesia Quick Evaluation

## 2015-07-23 NOTE — Op Note (Signed)
[facility] Patient Name: Megan [name] Procedure Date: 07/23/2015 7:49 AM MRN: [id number] Account #: [id number] Date of Birth: Nov 25, 1940 Admit Type: Outpatient Age: 75 Room: [facility] ENDO ROOM 4 Gender: Female Note Status: Finalized Procedure:            Upper GI endoscopy Indications:          Abdominal pain in the left upper quadrant, Dysphagia Providers:            Manya Silvas, MD Referring MD:         Tracie Harrier, MD (Referring MD) Medicines:            Propofol per Anesthesia Complications:        No immediate complications. Procedure:            Pre-Anesthesia Assessment:                       - After reviewing the risks and benefits, the patient                        was deemed in satisfactory condition to undergo the                        procedure.                       After obtaining informed consent, the endoscope was                        passed under direct vision. Throughout the procedure,                        the patient's blood pressure, pulse, and oxygen                        saturations were monitored continuously. The Endoscope                        was introduced through the mouth, and advanced to the                        second part of duodenum. The upper GI endoscopy was                        accomplished without difficulty. The patient tolerated                        the procedure well. Findings:      A mild Schatzki ring (acquired) was found at the gastroesophageal       junction. A guidewire was placed and the scope was withdrawn. Dilation       was performed with a Savary dilator with mild resistance at 16 mm and 17       mm.      Localized mild inflammation characterized by erythema and granularity       was found in the cardia. Biopsies were taken with a cold forceps for       histology.      One non-bleeding cratered and linear gastric ulcer with no stigmata of       bleeding was found in the  gastric antrum. Swollen folds around ulcer.  The lesion was 4 mm in largest dimension. Biopsies were taken with a       cold forceps for histology. Biopsies were taken with a cold forceps for       Helicobacter pylori testing.      A medium non-bleeding diverticulum was found in the second portion of       the duodenum. Impression:           - Mild Schatzki ring. Dilated.                       - Gastritis. Biopsied.                       - Non-bleeding gastric ulcer with no stigmata of                        bleeding. Biopsied.                       - Non-bleeding duodenal diverticulum. Recommendation:       - Await pathology results. Manya Silvas, MD 07/23/2015 8:08:48 AM This report has been signed electronically. Number of Addenda: 0 Note Initiated On: 07/23/2015 7:49 AM      [facility]

## 2015-07-24 ENCOUNTER — Encounter: Payer: Self-pay | Admitting: Unknown Physician Specialty

## 2015-07-27 LAB — SURGICAL PATHOLOGY

## 2015-07-31 ENCOUNTER — Ambulatory Visit
Admission: RE | Admit: 2015-07-31 | Discharge: 2015-07-31 | Disposition: A | Discharge status: Home / Self Care | Payer: Medicare (Managed Care) | Source: Ambulatory Visit | Attending: Urology | Admitting: Urology

## 2015-07-31 DIAGNOSIS — C642 Malignant neoplasm of left kidney, except renal pelvis: Secondary | ICD-10-CM | POA: Insufficient documentation

## 2015-07-31 DIAGNOSIS — Z9889 Other specified postprocedural states: Secondary | ICD-10-CM | POA: Insufficient documentation

## 2015-08-14 ENCOUNTER — Ambulatory Visit: Payer: Medicare Other | Admitting: Internal Medicine

## 2015-10-16 ENCOUNTER — Ambulatory Visit: Payer: Medicare Other | Admitting: Internal Medicine

## 2015-11-03 LAB — HM DIABETES EYE EXAM

## 2015-11-04 ENCOUNTER — Ambulatory Visit (INDEPENDENT_AMBULATORY_CARE_PROVIDER_SITE_OTHER): Payer: Medicare Other

## 2015-11-04 DIAGNOSIS — Z23 Encounter for immunization: Secondary | ICD-10-CM

## 2015-11-25 ENCOUNTER — Ambulatory Visit: Payer: Medicare Other | Admitting: Internal Medicine

## 2015-12-05 ENCOUNTER — Telehealth: Payer: Self-pay | Admitting: Internal Medicine

## 2015-12-05 MED ORDER — INSULIN ASPART 100 UNIT/ML ~~LOC~~ SOLN: 6.0000 [IU] | Freq: Every day | SUBCUTANEOUS | 1 refills | Status: DC

## 2015-12-05 NOTE — Telephone Encounter (Signed)
Patient need a refill of insulin insulin aspart (NOVOLOG) 100 UNIT/ML injection  [facility] [address] [place] - [place] [phone] (Phone) [phone] (Fax)

## 2015-12-05 NOTE — Telephone Encounter (Signed)
Refill submitted per patient's request.  

## 2015-12-08 ENCOUNTER — Encounter (INDEPENDENT_AMBULATORY_CARE_PROVIDER_SITE_OTHER): Payer: Medicare Other | Admitting: Ophthalmology

## 2015-12-08 DIAGNOSIS — H43813 Vitreous degeneration, bilateral: Secondary | ICD-10-CM

## 2015-12-08 DIAGNOSIS — I10 Essential (primary) hypertension: Secondary | ICD-10-CM | POA: Diagnosis not present

## 2015-12-08 DIAGNOSIS — H353111 Nonexudative age-related macular degeneration, right eye, early dry stage: Secondary | ICD-10-CM | POA: Diagnosis not present

## 2015-12-08 DIAGNOSIS — H35033 Hypertensive retinopathy, bilateral: Secondary | ICD-10-CM | POA: Diagnosis not present

## 2015-12-08 DIAGNOSIS — H353122 Nonexudative age-related macular degeneration, left eye, intermediate dry stage: Secondary | ICD-10-CM

## 2015-12-15 ENCOUNTER — Ambulatory Visit (INDEPENDENT_AMBULATORY_CARE_PROVIDER_SITE_OTHER): Payer: Medicare Other | Admitting: Internal Medicine

## 2015-12-15 ENCOUNTER — Encounter: Payer: Self-pay | Admitting: Internal Medicine

## 2015-12-15 VITALS — BP 148/80 | HR 65 | Wt 213.0 lb

## 2015-12-15 DIAGNOSIS — E1121 Type 2 diabetes mellitus with diabetic nephropathy: Secondary | ICD-10-CM | POA: Diagnosis not present

## 2015-12-15 DIAGNOSIS — Z794 Long term (current) use of insulin: Secondary | ICD-10-CM

## 2015-12-15 MED ORDER — INSULIN PEN NEEDLE 32G X 4 MM MISC: 3 refills | Status: DC

## 2015-12-15 NOTE — Patient Instructions (Addendum)
Please continue: - Lantus 20 units in am and 20 units at bedtime. - Glipizide XL 5 mg daily in am before b'fast.  Please start: - Novolog: 4 units before a small dinner 6 units before a large dinner 8 units before a very large dinner or if you have dessert  Please come back for a follow-up appointment in 3 months with your log.

## 2015-12-15 NOTE — Progress Notes (Signed)
Subjective:     Patient ID: Megan [name], female   DOB: 12/03/1940, 75 y.o.   MRN: [id number]  Diabetes   Ms. Reddy is a pleasant 75 y.o. woman returning for f/u for DM2, dx 1995, insulin-dependent, uncontrolled, with complications (chronic kidney disease). She is here with her husband. Last visit 5 mo ago.  Latest HbA1C: 11/06/2015: HbA1c 8.4%  06/30/2015: HbA1c 8.5% - Dr Hande 05/17/2015: HbA1c 9.3% - Dr. Kolluru Lab Results  Component Value Date   HGBA1C 7.7 (H) 05/23/2014   HGBA1C 8.6 (H) 02/22/2014   HGBA1C 6.6 (A) 09/27/2012  09/24/2013: HbA1c 7.3%  She takes: - Lantus 20 units in am and 20 units in hs - Novolog:  - not using this, if sugars at bedtime >200: 2-4 units - Glipizide XL 5 mg in am - restarted 06/2015 She stopped Januvia.  She was previously on Invokana 100 mg, but we need to stop it due to CKD. She was previously on Metformin and that helped tremendously - but had to be taken off Metformin b/c CKD.  She took Welchol in the past, now off.  She checks sugars occasinally, but did not bring a sugar log or meter: - am: 100 -125 >> 83-low 100s >> 86-100 >> 87-106 >> 65-105 >> 145-190 >> n/c - 2h after b'fast: 137 >> 108-145, 210 >> n/c - before lunch: 119-132 >> 150s >> n/c >> 80-100 >> 137, 139 >> 98-121, 169 >> n/c - 2h after lunch: 147 and 256 >> n/c >> 127-200, 220 >> 143-220 >> 108-128 >> n/c - before dinner: 99-102 >> 125-150 >> n/c >> 103-154 >> n/c >> 102-123 >> n/c - 2h after dinner: now 118-211 >> not checking >> n/c >> 107-168 >> n/c - hs: 118-227 >> 180s as she grazes >> 122-160 >> 191-312 >> 75-145, 151 >> 150-350 >> 140-170 No lows, has hypoglycemia awareness.  Highest: 504.  Eats 2 main meals a day and snacks a lot. "I eat everything under the sun".   - No neuropathy.  - Has CKD stage 3-4 - Dr. Kolluru. 11/06/2015: BUN 46, Cr 2.0 06/30/2015: BUN 35, Cr 1.9, EGFR 31, glucose 129 01/29/2014: BUN 39, Cr 2.14 09/24/2013: BUN 54, Cr  3.01 10/19/2012: BUN 34, Cr 1.8, PTH 180, TSH 0.91 09/27/2012: BUN: 78, Cr 2.8 Lab Results  Component Value Date   BUN 43 (H) 11/23/2013   CREATININE 2.73 (H) 11/23/2013   Last eye exam 11/03/2015. Dr. Hecker. 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  I reviewed pt's medications, allergies, PMH, social hx, family hx and no changes required except as above. She started Uloric, vitamin D 50,000 IU weekly. She was dx with L kidney cancer in 10/2012 >> s/p Bx >> pathology inconclusive >> Bx will be resent to [facility] >> partial nephrectomy in 01/2013.  I reviewed pt's medications, allergies, PMH, social hx, family hx, and changes were documented in the history of present illness. Otherwise, unchanged from my initial visit note.  Review of Systems Constitutional: + weight gain, + fatigue, no hot flushes/chills, no poor sleep, + nocturia Eyes: no blurry vision, no xerophthalmia ENT: no sore throat, no nodules palpated in throat, no dysphagia/ no odynophagia Cardiovascular: still has CP/+ SOB/no palpitations/+ leg swelling Respiratory: + cough/+ SOB/+ wheezing Gastrointestinal: no nausea/V/D/C/+heartburn Musculoskeletal: no muscle/+ joint aches Skin: no rash, no itching Neurological: no tremors/numbness/tingling/dizziness  Objective:   Physical Exam BP (!) 148/80   Pulse 65   Wt 213 lb (96.6 kg)     SpO2 99%   BMI 37.73 kg/m  Body mass index is 37.73 kg/m.  Wt Readings from Last 3 Encounters:  12/15/15 213 lb (96.6 kg)  07/23/15 212 lb (96.2 kg)  07/15/15 216 lb (98 kg)   Constitutional: overweight, in NAD Eyes: PERRLA, EOMI, no exophthalmos ENT: moist mucous membranes, no thyromegaly, no cervical lymphadenopathy Cardiovascular: RRR, No MRG Respiratory: CTA B Gastrointestinal: abdomen soft, NT, ND, BS+ Musculoskeletal: no deformities, strength intact in all 4 Skin: moist, warm, no rashes  Assessment:     1. DM2, poorly controlled,  insulin-dependent, with complications - CKD stage 3    Plan:  Pt with long-standing insulin-dependent DM2, Uncontrolled, with higher sugars after she stopped the paleo diet. She is also not compliant with her appointments, sugar checks and recommended regimen. I advised her to start Novolog before dinner at last visit >> did not start.  - Reviewed her previous HbA1c, obtained 1.5 mo ago: 8.4% - I advised her to: Patient Instructions  Please continue: - Lantus 20 units in am and 20 units at bedtime. - Glipizide XL 5 mg daily in am before b'fast.  Please start: - Novolog: 4 units before a small dinner 6 units before a large dinner 8 units before a very large dinner or if you have dessert  Please come back for a follow-up appointment in 3 months with your log  - I will see her back in 3 months with her sugar log   , MD PhD [facility]  

## 2016-01-06 ENCOUNTER — Telehealth: Payer: Self-pay | Admitting: Internal Medicine

## 2016-01-06 NOTE — Telephone Encounter (Signed)
Pt c/o flu-like symptoms: headache, body aches, chills, sore throat, sinus congestion, prod cough with white/yellow mucus.  S/s present Xseveral days- pt states husband is currently being treated for flu.    Pt requesting abx or further recs.   Pt uses [facility] on [address].   Sending to TP as CY has left for the day.  TP please advise.  Thanks!

## 2016-01-06 NOTE — Telephone Encounter (Signed)
Called and spoke to pt. Informed her of the recs per TP. Pt verbalized understanding and denied any further questions or concerns at this time.

## 2016-01-06 NOTE — Telephone Encounter (Signed)
Not seen in 6 month  tx sx with mucinex /fluids /tylenol  Needs ov if not better.  Please contact office for sooner follow up if symptoms do not improve or worsen or seek emergency care

## 2016-02-02 DIAGNOSIS — N183 Chronic kidney disease, stage 3 (moderate): Secondary | ICD-10-CM | POA: Diagnosis not present

## 2016-02-02 DIAGNOSIS — Z0000 Encounter for general adult medical examination without abnormal findings: Secondary | ICD-10-CM | POA: Diagnosis not present

## 2016-02-02 DIAGNOSIS — J209 Acute bronchitis, unspecified: Secondary | ICD-10-CM | POA: Diagnosis not present

## 2016-02-02 DIAGNOSIS — E1122 Type 2 diabetes mellitus with diabetic chronic kidney disease: Secondary | ICD-10-CM | POA: Diagnosis not present

## 2016-02-02 DIAGNOSIS — Z794 Long term (current) use of insulin: Secondary | ICD-10-CM | POA: Diagnosis not present

## 2016-02-02 DIAGNOSIS — I10 Essential (primary) hypertension: Secondary | ICD-10-CM | POA: Diagnosis not present

## 2016-02-03 DIAGNOSIS — E1122 Type 2 diabetes mellitus with diabetic chronic kidney disease: Secondary | ICD-10-CM | POA: Diagnosis not present

## 2016-02-03 DIAGNOSIS — Z0000 Encounter for general adult medical examination without abnormal findings: Secondary | ICD-10-CM | POA: Diagnosis not present

## 2016-02-03 DIAGNOSIS — I10 Essential (primary) hypertension: Secondary | ICD-10-CM | POA: Diagnosis not present

## 2016-02-03 DIAGNOSIS — J209 Acute bronchitis, unspecified: Secondary | ICD-10-CM | POA: Diagnosis not present

## 2016-02-03 DIAGNOSIS — Z794 Long term (current) use of insulin: Secondary | ICD-10-CM | POA: Diagnosis not present

## 2016-02-03 DIAGNOSIS — N183 Chronic kidney disease, stage 3 (moderate): Secondary | ICD-10-CM | POA: Diagnosis not present

## 2016-02-20 DIAGNOSIS — R69 Illness, unspecified: Secondary | ICD-10-CM | POA: Diagnosis not present

## 2016-02-20 DIAGNOSIS — M1A9XX0 Chronic gout, unspecified, without tophus (tophi): Secondary | ICD-10-CM | POA: Diagnosis not present

## 2016-02-20 DIAGNOSIS — Z85528 Personal history of other malignant neoplasm of kidney: Secondary | ICD-10-CM | POA: Diagnosis not present

## 2016-02-20 DIAGNOSIS — E669 Obesity, unspecified: Secondary | ICD-10-CM | POA: Diagnosis not present

## 2016-02-20 DIAGNOSIS — Z6836 Body mass index (BMI) 36.0-36.9, adult: Secondary | ICD-10-CM | POA: Diagnosis not present

## 2016-02-20 DIAGNOSIS — Z7902 Long term (current) use of antithrombotics/antiplatelets: Secondary | ICD-10-CM | POA: Diagnosis not present

## 2016-02-20 DIAGNOSIS — Z79899 Other long term (current) drug therapy: Secondary | ICD-10-CM | POA: Diagnosis not present

## 2016-02-20 DIAGNOSIS — Z8673 Personal history of transient ischemic attack (TIA), and cerebral infarction without residual deficits: Secondary | ICD-10-CM | POA: Diagnosis not present

## 2016-02-20 DIAGNOSIS — Z87891 Personal history of nicotine dependence: Secondary | ICD-10-CM | POA: Diagnosis not present

## 2016-02-20 DIAGNOSIS — I259 Chronic ischemic heart disease, unspecified: Secondary | ICD-10-CM | POA: Diagnosis not present

## 2016-02-20 DIAGNOSIS — G4700 Insomnia, unspecified: Secondary | ICD-10-CM | POA: Diagnosis not present

## 2016-02-20 DIAGNOSIS — I10 Essential (primary) hypertension: Secondary | ICD-10-CM | POA: Diagnosis not present

## 2016-02-20 DIAGNOSIS — E1142 Type 2 diabetes mellitus with diabetic polyneuropathy: Secondary | ICD-10-CM | POA: Diagnosis not present

## 2016-02-20 DIAGNOSIS — Z0000 Encounter for general adult medical examination without abnormal findings: Secondary | ICD-10-CM | POA: Diagnosis not present

## 2016-02-20 DIAGNOSIS — Z7984 Long term (current) use of oral hypoglycemic drugs: Secondary | ICD-10-CM | POA: Diagnosis not present

## 2016-02-20 DIAGNOSIS — J45909 Unspecified asthma, uncomplicated: Secondary | ICD-10-CM | POA: Diagnosis not present

## 2016-02-20 DIAGNOSIS — E559 Vitamin D deficiency, unspecified: Secondary | ICD-10-CM | POA: Diagnosis not present

## 2016-02-20 DIAGNOSIS — Z9181 History of falling: Secondary | ICD-10-CM | POA: Diagnosis not present

## 2016-02-20 DIAGNOSIS — E785 Hyperlipidemia, unspecified: Secondary | ICD-10-CM | POA: Diagnosis not present

## 2016-03-08 DIAGNOSIS — R69 Illness, unspecified: Secondary | ICD-10-CM | POA: Diagnosis not present

## 2016-03-16 ENCOUNTER — Ambulatory Visit: Payer: Medicare Other | Admitting: Internal Medicine

## 2016-03-17 DIAGNOSIS — E1122 Type 2 diabetes mellitus with diabetic chronic kidney disease: Secondary | ICD-10-CM | POA: Diagnosis not present

## 2016-03-17 DIAGNOSIS — N2581 Secondary hyperparathyroidism of renal origin: Secondary | ICD-10-CM | POA: Diagnosis not present

## 2016-03-17 DIAGNOSIS — N184 Chronic kidney disease, stage 4 (severe): Secondary | ICD-10-CM | POA: Diagnosis not present

## 2016-03-17 DIAGNOSIS — I129 Hypertensive chronic kidney disease with stage 1 through stage 4 chronic kidney disease, or unspecified chronic kidney disease: Secondary | ICD-10-CM | POA: Diagnosis not present

## 2016-03-17 DIAGNOSIS — R809 Proteinuria, unspecified: Secondary | ICD-10-CM | POA: Diagnosis not present

## 2016-03-30 DIAGNOSIS — R69 Illness, unspecified: Secondary | ICD-10-CM | POA: Diagnosis not present

## 2016-04-22 DIAGNOSIS — N183 Chronic kidney disease, stage 3 (moderate): Secondary | ICD-10-CM | POA: Diagnosis not present

## 2016-04-22 DIAGNOSIS — Z0000 Encounter for general adult medical examination without abnormal findings: Secondary | ICD-10-CM | POA: Diagnosis not present

## 2016-04-22 DIAGNOSIS — E1122 Type 2 diabetes mellitus with diabetic chronic kidney disease: Secondary | ICD-10-CM | POA: Diagnosis not present

## 2016-04-22 DIAGNOSIS — I10 Essential (primary) hypertension: Secondary | ICD-10-CM | POA: Diagnosis not present

## 2016-04-22 DIAGNOSIS — Z794 Long term (current) use of insulin: Secondary | ICD-10-CM | POA: Diagnosis not present

## 2016-04-22 DIAGNOSIS — J209 Acute bronchitis, unspecified: Secondary | ICD-10-CM | POA: Diagnosis not present

## 2016-04-23 DIAGNOSIS — J209 Acute bronchitis, unspecified: Secondary | ICD-10-CM | POA: Diagnosis not present

## 2016-04-23 DIAGNOSIS — E1122 Type 2 diabetes mellitus with diabetic chronic kidney disease: Secondary | ICD-10-CM | POA: Diagnosis not present

## 2016-04-23 DIAGNOSIS — Z794 Long term (current) use of insulin: Secondary | ICD-10-CM | POA: Diagnosis not present

## 2016-04-23 DIAGNOSIS — I10 Essential (primary) hypertension: Secondary | ICD-10-CM | POA: Diagnosis not present

## 2016-04-23 DIAGNOSIS — Z0000 Encounter for general adult medical examination without abnormal findings: Secondary | ICD-10-CM | POA: Diagnosis not present

## 2016-04-23 DIAGNOSIS — N183 Chronic kidney disease, stage 3 (moderate): Secondary | ICD-10-CM | POA: Diagnosis not present

## 2016-04-29 DIAGNOSIS — Z0000 Encounter for general adult medical examination without abnormal findings: Secondary | ICD-10-CM | POA: Diagnosis not present

## 2016-04-29 DIAGNOSIS — R69 Illness, unspecified: Secondary | ICD-10-CM | POA: Diagnosis not present

## 2016-04-29 DIAGNOSIS — N183 Chronic kidney disease, stage 3 (moderate): Secondary | ICD-10-CM | POA: Diagnosis not present

## 2016-04-29 DIAGNOSIS — D631 Anemia in chronic kidney disease: Secondary | ICD-10-CM | POA: Diagnosis not present

## 2016-04-29 DIAGNOSIS — N184 Chronic kidney disease, stage 4 (severe): Secondary | ICD-10-CM | POA: Diagnosis not present

## 2016-04-29 DIAGNOSIS — Z794 Long term (current) use of insulin: Secondary | ICD-10-CM | POA: Diagnosis not present

## 2016-04-29 DIAGNOSIS — E1122 Type 2 diabetes mellitus with diabetic chronic kidney disease: Secondary | ICD-10-CM | POA: Diagnosis not present

## 2016-05-09 DIAGNOSIS — R69 Illness, unspecified: Secondary | ICD-10-CM | POA: Diagnosis not present

## 2016-05-11 DIAGNOSIS — R69 Illness, unspecified: Secondary | ICD-10-CM | POA: Diagnosis not present

## 2016-06-16 DIAGNOSIS — Z794 Long term (current) use of insulin: Secondary | ICD-10-CM | POA: Diagnosis not present

## 2016-06-16 DIAGNOSIS — E1142 Type 2 diabetes mellitus with diabetic polyneuropathy: Secondary | ICD-10-CM | POA: Diagnosis not present

## 2016-06-16 DIAGNOSIS — N183 Chronic kidney disease, stage 3 (moderate): Secondary | ICD-10-CM | POA: Diagnosis not present

## 2016-06-16 DIAGNOSIS — B351 Tinea unguium: Secondary | ICD-10-CM | POA: Diagnosis not present

## 2016-06-30 DIAGNOSIS — R69 Illness, unspecified: Secondary | ICD-10-CM | POA: Diagnosis not present

## 2016-08-21 DIAGNOSIS — R69 Illness, unspecified: Secondary | ICD-10-CM | POA: Diagnosis not present

## 2016-08-24 DIAGNOSIS — D631 Anemia in chronic kidney disease: Secondary | ICD-10-CM | POA: Diagnosis not present

## 2016-08-24 DIAGNOSIS — E1122 Type 2 diabetes mellitus with diabetic chronic kidney disease: Secondary | ICD-10-CM | POA: Diagnosis not present

## 2016-08-24 DIAGNOSIS — R69 Illness, unspecified: Secondary | ICD-10-CM | POA: Diagnosis not present

## 2016-08-24 DIAGNOSIS — N183 Chronic kidney disease, stage 3 (moderate): Secondary | ICD-10-CM | POA: Diagnosis not present

## 2016-08-24 DIAGNOSIS — Z0000 Encounter for general adult medical examination without abnormal findings: Secondary | ICD-10-CM | POA: Diagnosis not present

## 2016-08-24 DIAGNOSIS — Z794 Long term (current) use of insulin: Secondary | ICD-10-CM | POA: Diagnosis not present

## 2016-08-24 DIAGNOSIS — N184 Chronic kidney disease, stage 4 (severe): Secondary | ICD-10-CM | POA: Diagnosis not present

## 2016-08-26 DIAGNOSIS — I129 Hypertensive chronic kidney disease with stage 1 through stage 4 chronic kidney disease, or unspecified chronic kidney disease: Secondary | ICD-10-CM | POA: Diagnosis not present

## 2016-08-26 DIAGNOSIS — R809 Proteinuria, unspecified: Secondary | ICD-10-CM | POA: Diagnosis not present

## 2016-08-26 DIAGNOSIS — E1122 Type 2 diabetes mellitus with diabetic chronic kidney disease: Secondary | ICD-10-CM | POA: Diagnosis not present

## 2016-08-26 DIAGNOSIS — N184 Chronic kidney disease, stage 4 (severe): Secondary | ICD-10-CM | POA: Diagnosis not present

## 2016-08-26 DIAGNOSIS — N2581 Secondary hyperparathyroidism of renal origin: Secondary | ICD-10-CM | POA: Diagnosis not present

## 2016-08-31 DIAGNOSIS — J069 Acute upper respiratory infection, unspecified: Secondary | ICD-10-CM | POA: Diagnosis not present

## 2016-08-31 DIAGNOSIS — N184 Chronic kidney disease, stage 4 (severe): Secondary | ICD-10-CM | POA: Diagnosis not present

## 2016-08-31 DIAGNOSIS — R110 Nausea: Secondary | ICD-10-CM | POA: Diagnosis not present

## 2016-08-31 DIAGNOSIS — J4530 Mild persistent asthma, uncomplicated: Secondary | ICD-10-CM | POA: Diagnosis not present

## 2016-08-31 DIAGNOSIS — Z794 Long term (current) use of insulin: Secondary | ICD-10-CM | POA: Diagnosis not present

## 2016-08-31 DIAGNOSIS — E1122 Type 2 diabetes mellitus with diabetic chronic kidney disease: Secondary | ICD-10-CM | POA: Diagnosis not present

## 2016-08-31 DIAGNOSIS — D631 Anemia in chronic kidney disease: Secondary | ICD-10-CM | POA: Diagnosis not present

## 2016-08-31 DIAGNOSIS — I10 Essential (primary) hypertension: Secondary | ICD-10-CM | POA: Diagnosis not present

## 2016-09-16 ENCOUNTER — Other Ambulatory Visit: Payer: Self-pay | Admitting: Nephrology

## 2016-09-16 DIAGNOSIS — N184 Chronic kidney disease, stage 4 (severe): Secondary | ICD-10-CM

## 2016-09-21 ENCOUNTER — Ambulatory Visit
Admission: RE | Admit: 2016-09-21 | Discharge: 2016-09-21 | Disposition: A | Discharge status: Home / Self Care | Payer: Medicare HMO | Source: Ambulatory Visit | Attending: Nephrology | Admitting: Nephrology

## 2016-09-21 DIAGNOSIS — N184 Chronic kidney disease, stage 4 (severe): Secondary | ICD-10-CM | POA: Diagnosis not present

## 2016-09-21 DIAGNOSIS — Z905 Acquired absence of kidney: Secondary | ICD-10-CM | POA: Diagnosis not present

## 2016-09-21 DIAGNOSIS — N281 Cyst of kidney, acquired: Secondary | ICD-10-CM | POA: Insufficient documentation

## 2016-09-24 ENCOUNTER — Other Ambulatory Visit: Payer: Self-pay | Admitting: Nephrology

## 2016-09-24 DIAGNOSIS — N184 Chronic kidney disease, stage 4 (severe): Secondary | ICD-10-CM

## 2016-10-05 DIAGNOSIS — R69 Illness, unspecified: Secondary | ICD-10-CM | POA: Diagnosis not present

## 2016-10-06 DIAGNOSIS — L600 Ingrowing nail: Secondary | ICD-10-CM | POA: Diagnosis not present

## 2016-10-06 DIAGNOSIS — E1140 Type 2 diabetes mellitus with diabetic neuropathy, unspecified: Secondary | ICD-10-CM | POA: Diagnosis not present

## 2016-10-06 DIAGNOSIS — B351 Tinea unguium: Secondary | ICD-10-CM | POA: Diagnosis not present

## 2016-10-06 DIAGNOSIS — Z794 Long term (current) use of insulin: Secondary | ICD-10-CM | POA: Diagnosis not present

## 2016-10-11 DIAGNOSIS — H35372 Puckering of macula, left eye: Secondary | ICD-10-CM | POA: Diagnosis not present

## 2016-10-11 DIAGNOSIS — H47392 Other disorders of optic disc, left eye: Secondary | ICD-10-CM | POA: Diagnosis not present

## 2016-10-11 DIAGNOSIS — H3122 Choroidal dystrophy (central areolar) (generalized) (peripapillary): Secondary | ICD-10-CM | POA: Diagnosis not present

## 2016-10-11 DIAGNOSIS — E113293 Type 2 diabetes mellitus with mild nonproliferative diabetic retinopathy without macular edema, bilateral: Secondary | ICD-10-CM | POA: Diagnosis not present

## 2016-10-11 DIAGNOSIS — H31093 Other chorioretinal scars, bilateral: Secondary | ICD-10-CM | POA: Diagnosis not present

## 2016-10-11 DIAGNOSIS — H35352 Cystoid macular degeneration, left eye: Secondary | ICD-10-CM | POA: Diagnosis not present

## 2016-10-20 ENCOUNTER — Other Ambulatory Visit: Payer: Self-pay | Admitting: Internal Medicine

## 2016-10-20 ENCOUNTER — Ambulatory Visit
Admission: RE | Admit: 2016-10-20 | Discharge: 2016-10-20 | Disposition: A | Discharge status: Home / Self Care | Payer: Medicare HMO | Source: Ambulatory Visit | Attending: Internal Medicine | Admitting: Internal Medicine

## 2016-10-20 DIAGNOSIS — I10 Essential (primary) hypertension: Secondary | ICD-10-CM | POA: Diagnosis not present

## 2016-10-20 DIAGNOSIS — E1159 Type 2 diabetes mellitus with other circulatory complications: Secondary | ICD-10-CM | POA: Diagnosis not present

## 2016-10-20 DIAGNOSIS — R600 Localized edema: Secondary | ICD-10-CM

## 2016-10-20 DIAGNOSIS — N184 Chronic kidney disease, stage 4 (severe): Secondary | ICD-10-CM | POA: Diagnosis not present

## 2016-10-20 DIAGNOSIS — E1169 Type 2 diabetes mellitus with other specified complication: Secondary | ICD-10-CM | POA: Diagnosis not present

## 2016-10-20 DIAGNOSIS — J4530 Mild persistent asthma, uncomplicated: Secondary | ICD-10-CM | POA: Diagnosis not present

## 2016-10-20 DIAGNOSIS — M7989 Other specified soft tissue disorders: Secondary | ICD-10-CM | POA: Diagnosis not present

## 2016-10-20 DIAGNOSIS — E1122 Type 2 diabetes mellitus with diabetic chronic kidney disease: Secondary | ICD-10-CM | POA: Diagnosis not present

## 2016-10-20 DIAGNOSIS — Z794 Long term (current) use of insulin: Secondary | ICD-10-CM | POA: Diagnosis not present

## 2016-10-20 DIAGNOSIS — K219 Gastro-esophageal reflux disease without esophagitis: Secondary | ICD-10-CM | POA: Diagnosis not present

## 2016-10-20 DIAGNOSIS — E785 Hyperlipidemia, unspecified: Secondary | ICD-10-CM | POA: Diagnosis not present

## 2016-10-20 DIAGNOSIS — E559 Vitamin D deficiency, unspecified: Secondary | ICD-10-CM | POA: Diagnosis not present

## 2016-10-20 DIAGNOSIS — R69 Illness, unspecified: Secondary | ICD-10-CM | POA: Diagnosis not present

## 2016-11-04 DIAGNOSIS — K219 Gastro-esophageal reflux disease without esophagitis: Secondary | ICD-10-CM | POA: Diagnosis not present

## 2016-11-04 DIAGNOSIS — R197 Diarrhea, unspecified: Secondary | ICD-10-CM | POA: Diagnosis not present

## 2016-11-04 DIAGNOSIS — K5909 Other constipation: Secondary | ICD-10-CM | POA: Diagnosis not present

## 2016-11-10 DIAGNOSIS — M5431 Sciatica, right side: Secondary | ICD-10-CM | POA: Diagnosis not present

## 2016-11-10 DIAGNOSIS — E1159 Type 2 diabetes mellitus with other circulatory complications: Secondary | ICD-10-CM | POA: Diagnosis not present

## 2016-11-10 DIAGNOSIS — J4530 Mild persistent asthma, uncomplicated: Secondary | ICD-10-CM | POA: Diagnosis not present

## 2016-11-10 DIAGNOSIS — I10 Essential (primary) hypertension: Secondary | ICD-10-CM | POA: Diagnosis not present

## 2016-11-11 DIAGNOSIS — R69 Illness, unspecified: Secondary | ICD-10-CM | POA: Diagnosis not present

## 2016-11-27 DIAGNOSIS — R69 Illness, unspecified: Secondary | ICD-10-CM | POA: Diagnosis not present

## 2016-12-07 DIAGNOSIS — M8589 Other specified disorders of bone density and structure, multiple sites: Secondary | ICD-10-CM | POA: Diagnosis not present

## 2016-12-07 DIAGNOSIS — Z780 Asymptomatic menopausal state: Secondary | ICD-10-CM | POA: Diagnosis not present

## 2016-12-07 DIAGNOSIS — Z1231 Encounter for screening mammogram for malignant neoplasm of breast: Secondary | ICD-10-CM | POA: Diagnosis not present

## 2016-12-23 DIAGNOSIS — Z124 Encounter for screening for malignant neoplasm of cervix: Secondary | ICD-10-CM | POA: Diagnosis not present

## 2017-01-14 ENCOUNTER — Other Ambulatory Visit: Payer: Self-pay | Admitting: Internal Medicine

## 2017-01-14 ENCOUNTER — Ambulatory Visit
Admission: RE | Admit: 2017-01-14 | Discharge: 2017-01-14 | Disposition: A | Discharge status: Home / Self Care | Payer: Medicare HMO | Source: Ambulatory Visit | Attending: Internal Medicine | Admitting: Internal Medicine

## 2017-01-14 DIAGNOSIS — R531 Weakness: Secondary | ICD-10-CM

## 2017-01-14 DIAGNOSIS — R600 Localized edema: Secondary | ICD-10-CM | POA: Diagnosis not present

## 2017-01-14 DIAGNOSIS — R2681 Unsteadiness on feet: Secondary | ICD-10-CM

## 2017-01-14 DIAGNOSIS — Z8673 Personal history of transient ischemic attack (TIA), and cerebral infarction without residual deficits: Secondary | ICD-10-CM | POA: Diagnosis not present

## 2017-01-14 DIAGNOSIS — N184 Chronic kidney disease, stage 4 (severe): Secondary | ICD-10-CM | POA: Diagnosis not present

## 2017-01-14 DIAGNOSIS — J4540 Moderate persistent asthma, uncomplicated: Secondary | ICD-10-CM | POA: Diagnosis not present

## 2017-01-14 DIAGNOSIS — W19XXXA Unspecified fall, initial encounter: Secondary | ICD-10-CM

## 2017-01-14 DIAGNOSIS — E1159 Type 2 diabetes mellitus with other circulatory complications: Secondary | ICD-10-CM | POA: Diagnosis not present

## 2017-01-14 DIAGNOSIS — E1122 Type 2 diabetes mellitus with diabetic chronic kidney disease: Secondary | ICD-10-CM | POA: Diagnosis not present

## 2017-01-14 DIAGNOSIS — I10 Essential (primary) hypertension: Secondary | ICD-10-CM | POA: Diagnosis not present

## 2017-01-14 DIAGNOSIS — Z9181 History of falling: Secondary | ICD-10-CM | POA: Diagnosis not present

## 2017-01-14 DIAGNOSIS — Z794 Long term (current) use of insulin: Secondary | ICD-10-CM | POA: Diagnosis not present

## 2017-01-25 DIAGNOSIS — M5440 Lumbago with sciatica, unspecified side: Secondary | ICD-10-CM | POA: Diagnosis not present

## 2017-01-25 DIAGNOSIS — M25551 Pain in right hip: Secondary | ICD-10-CM | POA: Diagnosis not present

## 2017-01-27 DIAGNOSIS — R69 Illness, unspecified: Secondary | ICD-10-CM | POA: Diagnosis not present

## 2017-01-27 DIAGNOSIS — I10 Essential (primary) hypertension: Secondary | ICD-10-CM | POA: Diagnosis not present

## 2017-01-27 DIAGNOSIS — E1169 Type 2 diabetes mellitus with other specified complication: Secondary | ICD-10-CM | POA: Diagnosis not present

## 2017-01-27 DIAGNOSIS — N184 Chronic kidney disease, stage 4 (severe): Secondary | ICD-10-CM | POA: Diagnosis not present

## 2017-01-27 DIAGNOSIS — E1122 Type 2 diabetes mellitus with diabetic chronic kidney disease: Secondary | ICD-10-CM | POA: Diagnosis not present

## 2017-01-27 DIAGNOSIS — E559 Vitamin D deficiency, unspecified: Secondary | ICD-10-CM | POA: Diagnosis not present

## 2017-01-27 DIAGNOSIS — E1159 Type 2 diabetes mellitus with other circulatory complications: Secondary | ICD-10-CM | POA: Diagnosis not present

## 2017-01-27 DIAGNOSIS — Z794 Long term (current) use of insulin: Secondary | ICD-10-CM | POA: Diagnosis not present

## 2017-01-27 DIAGNOSIS — E785 Hyperlipidemia, unspecified: Secondary | ICD-10-CM | POA: Diagnosis not present

## 2017-01-31 DIAGNOSIS — M7061 Trochanteric bursitis, right hip: Secondary | ICD-10-CM | POA: Diagnosis not present

## 2017-01-31 DIAGNOSIS — M25551 Pain in right hip: Secondary | ICD-10-CM | POA: Diagnosis not present

## 2017-02-01 DIAGNOSIS — M7061 Trochanteric bursitis, right hip: Secondary | ICD-10-CM | POA: Insufficient documentation

## 2017-02-08 DIAGNOSIS — M25551 Pain in right hip: Secondary | ICD-10-CM | POA: Diagnosis not present

## 2017-02-08 DIAGNOSIS — Z794 Long term (current) use of insulin: Secondary | ICD-10-CM | POA: Diagnosis not present

## 2017-02-08 DIAGNOSIS — Z9181 History of falling: Secondary | ICD-10-CM | POA: Diagnosis not present

## 2017-02-08 DIAGNOSIS — E1159 Type 2 diabetes mellitus with other circulatory complications: Secondary | ICD-10-CM | POA: Diagnosis not present

## 2017-02-08 DIAGNOSIS — E1122 Type 2 diabetes mellitus with diabetic chronic kidney disease: Secondary | ICD-10-CM | POA: Diagnosis not present

## 2017-02-08 DIAGNOSIS — N184 Chronic kidney disease, stage 4 (severe): Secondary | ICD-10-CM | POA: Diagnosis not present

## 2017-02-08 DIAGNOSIS — J4540 Moderate persistent asthma, uncomplicated: Secondary | ICD-10-CM | POA: Diagnosis not present

## 2017-02-08 DIAGNOSIS — I10 Essential (primary) hypertension: Secondary | ICD-10-CM | POA: Diagnosis not present

## 2017-02-09 ENCOUNTER — Other Ambulatory Visit: Payer: Self-pay | Admitting: Internal Medicine

## 2017-02-09 DIAGNOSIS — M25551 Pain in right hip: Secondary | ICD-10-CM

## 2017-02-18 ENCOUNTER — Ambulatory Visit
Admission: RE | Admit: 2017-02-18 | Discharge: 2017-02-18 | Disposition: A | Discharge status: Home / Self Care | Payer: Medicare HMO | Source: Ambulatory Visit | Attending: Internal Medicine | Admitting: Internal Medicine

## 2017-02-18 DIAGNOSIS — M47897 Other spondylosis, lumbosacral region: Secondary | ICD-10-CM | POA: Diagnosis not present

## 2017-02-18 DIAGNOSIS — M160 Bilateral primary osteoarthritis of hip: Secondary | ICD-10-CM | POA: Diagnosis not present

## 2017-02-18 DIAGNOSIS — M25551 Pain in right hip: Secondary | ICD-10-CM

## 2017-02-18 DIAGNOSIS — K5730 Diverticulosis of large intestine without perforation or abscess without bleeding: Secondary | ICD-10-CM | POA: Insufficient documentation

## 2017-02-28 DIAGNOSIS — N184 Chronic kidney disease, stage 4 (severe): Secondary | ICD-10-CM | POA: Diagnosis not present

## 2017-02-28 DIAGNOSIS — N259 Disorder resulting from impaired renal tubular function, unspecified: Secondary | ICD-10-CM | POA: Diagnosis not present

## 2017-02-28 DIAGNOSIS — M109 Gout, unspecified: Secondary | ICD-10-CM | POA: Diagnosis not present

## 2017-02-28 DIAGNOSIS — E1122 Type 2 diabetes mellitus with diabetic chronic kidney disease: Secondary | ICD-10-CM | POA: Diagnosis not present

## 2017-02-28 DIAGNOSIS — I129 Hypertensive chronic kidney disease with stage 1 through stage 4 chronic kidney disease, or unspecified chronic kidney disease: Secondary | ICD-10-CM | POA: Diagnosis not present

## 2017-03-02 DIAGNOSIS — M5136 Other intervertebral disc degeneration, lumbar region: Secondary | ICD-10-CM | POA: Diagnosis not present

## 2017-03-02 DIAGNOSIS — M47816 Spondylosis without myelopathy or radiculopathy, lumbar region: Secondary | ICD-10-CM | POA: Diagnosis not present

## 2017-03-02 DIAGNOSIS — M5416 Radiculopathy, lumbar region: Secondary | ICD-10-CM | POA: Diagnosis not present

## 2017-03-04 DIAGNOSIS — R69 Illness, unspecified: Secondary | ICD-10-CM | POA: Diagnosis not present

## 2017-03-14 DIAGNOSIS — K5900 Constipation, unspecified: Secondary | ICD-10-CM | POA: Diagnosis not present

## 2017-03-14 DIAGNOSIS — K2970 Gastritis, unspecified, without bleeding: Secondary | ICD-10-CM | POA: Diagnosis not present

## 2017-03-14 DIAGNOSIS — K219 Gastro-esophageal reflux disease without esophagitis: Secondary | ICD-10-CM | POA: Diagnosis not present

## 2017-03-16 DIAGNOSIS — E1122 Type 2 diabetes mellitus with diabetic chronic kidney disease: Secondary | ICD-10-CM | POA: Diagnosis not present

## 2017-03-16 DIAGNOSIS — R011 Cardiac murmur, unspecified: Secondary | ICD-10-CM | POA: Diagnosis not present

## 2017-03-16 DIAGNOSIS — N184 Chronic kidney disease, stage 4 (severe): Secondary | ICD-10-CM | POA: Diagnosis not present

## 2017-03-16 DIAGNOSIS — D631 Anemia in chronic kidney disease: Secondary | ICD-10-CM | POA: Diagnosis not present

## 2017-03-16 DIAGNOSIS — R262 Difficulty in walking, not elsewhere classified: Secondary | ICD-10-CM | POA: Diagnosis not present

## 2017-03-16 DIAGNOSIS — I10 Essential (primary) hypertension: Secondary | ICD-10-CM | POA: Diagnosis not present

## 2017-03-16 DIAGNOSIS — E1159 Type 2 diabetes mellitus with other circulatory complications: Secondary | ICD-10-CM | POA: Diagnosis not present

## 2017-03-16 DIAGNOSIS — Z794 Long term (current) use of insulin: Secondary | ICD-10-CM | POA: Diagnosis not present

## 2017-03-16 DIAGNOSIS — M5441 Lumbago with sciatica, right side: Secondary | ICD-10-CM | POA: Diagnosis not present

## 2017-03-16 DIAGNOSIS — N289 Disorder of kidney and ureter, unspecified: Secondary | ICD-10-CM | POA: Diagnosis not present

## 2017-03-18 DIAGNOSIS — M5416 Radiculopathy, lumbar region: Secondary | ICD-10-CM | POA: Diagnosis not present

## 2017-03-18 DIAGNOSIS — M5136 Other intervertebral disc degeneration, lumbar region: Secondary | ICD-10-CM | POA: Diagnosis not present

## 2017-04-05 DIAGNOSIS — R69 Illness, unspecified: Secondary | ICD-10-CM | POA: Diagnosis not present

## 2017-04-06 DIAGNOSIS — H47392 Other disorders of optic disc, left eye: Secondary | ICD-10-CM | POA: Diagnosis not present

## 2017-04-06 DIAGNOSIS — H35352 Cystoid macular degeneration, left eye: Secondary | ICD-10-CM | POA: Diagnosis not present

## 2017-04-06 DIAGNOSIS — E113293 Type 2 diabetes mellitus with mild nonproliferative diabetic retinopathy without macular edema, bilateral: Secondary | ICD-10-CM | POA: Diagnosis not present

## 2017-04-06 DIAGNOSIS — H31093 Other chorioretinal scars, bilateral: Secondary | ICD-10-CM | POA: Diagnosis not present

## 2017-04-06 DIAGNOSIS — H3122 Choroidal dystrophy (central areolar) (generalized) (peripapillary): Secondary | ICD-10-CM | POA: Diagnosis not present

## 2017-04-06 DIAGNOSIS — H35351 Cystoid macular degeneration, right eye: Secondary | ICD-10-CM | POA: Diagnosis not present

## 2017-04-11 DIAGNOSIS — M5416 Radiculopathy, lumbar region: Secondary | ICD-10-CM | POA: Diagnosis not present

## 2017-04-11 DIAGNOSIS — M5136 Other intervertebral disc degeneration, lumbar region: Secondary | ICD-10-CM | POA: Diagnosis not present

## 2017-04-26 DIAGNOSIS — Z794 Long term (current) use of insulin: Secondary | ICD-10-CM | POA: Diagnosis not present

## 2017-04-26 DIAGNOSIS — E1122 Type 2 diabetes mellitus with diabetic chronic kidney disease: Secondary | ICD-10-CM | POA: Diagnosis not present

## 2017-04-26 DIAGNOSIS — E559 Vitamin D deficiency, unspecified: Secondary | ICD-10-CM | POA: Diagnosis not present

## 2017-04-26 DIAGNOSIS — E1159 Type 2 diabetes mellitus with other circulatory complications: Secondary | ICD-10-CM | POA: Diagnosis not present

## 2017-04-26 DIAGNOSIS — N184 Chronic kidney disease, stage 4 (severe): Secondary | ICD-10-CM | POA: Diagnosis not present

## 2017-04-26 DIAGNOSIS — I10 Essential (primary) hypertension: Secondary | ICD-10-CM | POA: Diagnosis not present

## 2017-04-26 DIAGNOSIS — Z6839 Body mass index (BMI) 39.0-39.9, adult: Secondary | ICD-10-CM | POA: Diagnosis not present

## 2017-04-26 DIAGNOSIS — J45909 Unspecified asthma, uncomplicated: Secondary | ICD-10-CM | POA: Diagnosis not present

## 2017-04-27 DIAGNOSIS — R69 Illness, unspecified: Secondary | ICD-10-CM | POA: Diagnosis not present

## 2017-04-28 DIAGNOSIS — H04123 Dry eye syndrome of bilateral lacrimal glands: Secondary | ICD-10-CM | POA: Diagnosis not present

## 2017-04-28 DIAGNOSIS — H35033 Hypertensive retinopathy, bilateral: Secondary | ICD-10-CM | POA: Diagnosis not present

## 2017-04-28 DIAGNOSIS — E119 Type 2 diabetes mellitus without complications: Secondary | ICD-10-CM | POA: Diagnosis not present

## 2017-04-28 DIAGNOSIS — H353131 Nonexudative age-related macular degeneration, bilateral, early dry stage: Secondary | ICD-10-CM | POA: Diagnosis not present

## 2017-04-28 LAB — HM DIABETES EYE EXAM

## 2017-05-05 DIAGNOSIS — E785 Hyperlipidemia, unspecified: Secondary | ICD-10-CM | POA: Diagnosis not present

## 2017-05-05 DIAGNOSIS — E1169 Type 2 diabetes mellitus with other specified complication: Secondary | ICD-10-CM | POA: Diagnosis not present

## 2017-05-05 DIAGNOSIS — E1122 Type 2 diabetes mellitus with diabetic chronic kidney disease: Secondary | ICD-10-CM | POA: Diagnosis not present

## 2017-05-05 DIAGNOSIS — Z794 Long term (current) use of insulin: Secondary | ICD-10-CM | POA: Diagnosis not present

## 2017-05-05 DIAGNOSIS — I10 Essential (primary) hypertension: Secondary | ICD-10-CM | POA: Diagnosis not present

## 2017-05-05 DIAGNOSIS — R69 Illness, unspecified: Secondary | ICD-10-CM | POA: Diagnosis not present

## 2017-05-05 DIAGNOSIS — E1159 Type 2 diabetes mellitus with other circulatory complications: Secondary | ICD-10-CM | POA: Diagnosis not present

## 2017-05-05 DIAGNOSIS — N184 Chronic kidney disease, stage 4 (severe): Secondary | ICD-10-CM | POA: Diagnosis not present

## 2017-05-26 DIAGNOSIS — E785 Hyperlipidemia, unspecified: Secondary | ICD-10-CM | POA: Diagnosis not present

## 2017-05-26 DIAGNOSIS — E1142 Type 2 diabetes mellitus with diabetic polyneuropathy: Secondary | ICD-10-CM | POA: Diagnosis not present

## 2017-05-26 DIAGNOSIS — E669 Obesity, unspecified: Secondary | ICD-10-CM | POA: Diagnosis not present

## 2017-05-26 DIAGNOSIS — I10 Essential (primary) hypertension: Secondary | ICD-10-CM | POA: Diagnosis not present

## 2017-05-26 DIAGNOSIS — G4700 Insomnia, unspecified: Secondary | ICD-10-CM | POA: Diagnosis not present

## 2017-05-26 DIAGNOSIS — I69351 Hemiplegia and hemiparesis following cerebral infarction affecting right dominant side: Secondary | ICD-10-CM | POA: Diagnosis not present

## 2017-05-26 DIAGNOSIS — G8929 Other chronic pain: Secondary | ICD-10-CM | POA: Diagnosis not present

## 2017-05-26 DIAGNOSIS — E11620 Type 2 diabetes mellitus with diabetic dermatitis: Secondary | ICD-10-CM | POA: Diagnosis not present

## 2017-05-26 DIAGNOSIS — J45909 Unspecified asthma, uncomplicated: Secondary | ICD-10-CM | POA: Diagnosis not present

## 2017-05-26 DIAGNOSIS — Z794 Long term (current) use of insulin: Secondary | ICD-10-CM | POA: Diagnosis not present

## 2017-05-27 DIAGNOSIS — N189 Chronic kidney disease, unspecified: Secondary | ICD-10-CM | POA: Diagnosis not present

## 2017-05-27 DIAGNOSIS — R809 Proteinuria, unspecified: Secondary | ICD-10-CM | POA: Diagnosis not present

## 2017-05-27 DIAGNOSIS — N2581 Secondary hyperparathyroidism of renal origin: Secondary | ICD-10-CM | POA: Diagnosis not present

## 2017-05-27 DIAGNOSIS — E1122 Type 2 diabetes mellitus with diabetic chronic kidney disease: Secondary | ICD-10-CM | POA: Diagnosis not present

## 2017-05-27 DIAGNOSIS — M109 Gout, unspecified: Secondary | ICD-10-CM | POA: Diagnosis not present

## 2017-05-27 DIAGNOSIS — I129 Hypertensive chronic kidney disease with stage 1 through stage 4 chronic kidney disease, or unspecified chronic kidney disease: Secondary | ICD-10-CM | POA: Diagnosis not present

## 2017-05-27 DIAGNOSIS — N184 Chronic kidney disease, stage 4 (severe): Secondary | ICD-10-CM | POA: Diagnosis not present

## 2017-06-03 ENCOUNTER — Ambulatory Visit
Admission: RE | Admit: 2017-06-03 | Discharge: 2017-06-03 | Disposition: A | Discharge status: Home / Self Care | Payer: Medicare HMO | Source: Ambulatory Visit | Attending: Internal Medicine | Admitting: Internal Medicine

## 2017-06-03 ENCOUNTER — Other Ambulatory Visit: Payer: Self-pay | Admitting: Internal Medicine

## 2017-06-03 DIAGNOSIS — M79661 Pain in right lower leg: Secondary | ICD-10-CM | POA: Diagnosis not present

## 2017-06-03 DIAGNOSIS — M7989 Other specified soft tissue disorders: Secondary | ICD-10-CM | POA: Diagnosis not present

## 2017-06-03 DIAGNOSIS — R69 Illness, unspecified: Secondary | ICD-10-CM | POA: Diagnosis not present

## 2017-06-03 DIAGNOSIS — N184 Chronic kidney disease, stage 4 (severe): Secondary | ICD-10-CM | POA: Diagnosis not present

## 2017-06-03 DIAGNOSIS — Z794 Long term (current) use of insulin: Secondary | ICD-10-CM | POA: Diagnosis not present

## 2017-06-03 DIAGNOSIS — Z0000 Encounter for general adult medical examination without abnormal findings: Secondary | ICD-10-CM | POA: Diagnosis not present

## 2017-06-03 DIAGNOSIS — I10 Essential (primary) hypertension: Secondary | ICD-10-CM | POA: Diagnosis not present

## 2017-06-03 DIAGNOSIS — E1122 Type 2 diabetes mellitus with diabetic chronic kidney disease: Secondary | ICD-10-CM | POA: Diagnosis not present

## 2017-06-03 DIAGNOSIS — K219 Gastro-esophageal reflux disease without esophagitis: Secondary | ICD-10-CM | POA: Diagnosis not present

## 2017-06-15 DIAGNOSIS — R69 Illness, unspecified: Secondary | ICD-10-CM | POA: Diagnosis not present

## 2017-07-04 ENCOUNTER — Encounter: Payer: Self-pay | Admitting: Gastroenterology

## 2017-07-04 ENCOUNTER — Ambulatory Visit (INDEPENDENT_AMBULATORY_CARE_PROVIDER_SITE_OTHER): Payer: Medicare HMO | Admitting: Gastroenterology

## 2017-07-04 VITALS — BP 115/69 | HR 84 | Wt 215.0 lb

## 2017-07-04 DIAGNOSIS — R109 Unspecified abdominal pain: Secondary | ICD-10-CM

## 2017-07-04 DIAGNOSIS — K581 Irritable bowel syndrome with constipation: Secondary | ICD-10-CM | POA: Diagnosis not present

## 2017-07-04 DIAGNOSIS — E669 Obesity, unspecified: Secondary | ICD-10-CM | POA: Insufficient documentation

## 2017-07-04 DIAGNOSIS — I639 Cerebral infarction, unspecified: Secondary | ICD-10-CM | POA: Insufficient documentation

## 2017-07-04 NOTE — Addendum Note (Signed)
Addended by: Peggye Ley on: 07/04/2017 04:13 PM   Modules accepted: Orders

## 2017-07-04 NOTE — Progress Notes (Signed)
Jonathon Bellows MD, MRCP(U.K) [address]  [place]  [place], [place] [address]  Main: [phone]  Fax: [phone]   Gastroenterology Consultation  Referring Provider:     Tracie Harrier, MD Primary Care Physician:  Tracie Harrier, MD Primary Gastroenterologist:  Dr. Jonathon Bellows  Reason for Consultation:     Abdominal pain         HPI:   Megan [name] is a 77 y.o. y/o female referred for consultation & management  by Dr. Tracie Harrier, MD.    She has been referred for abdominal pain .  She has been seen by [facility] as early as 02/2017 for dysphagia,GERD,constipation, colon polyps. She had been on Oxycodone,tramadol.  At her last visit the main issue was constipation.   CT abdomen 02/2017-colonic diverticulosis.   07/2015: EGD: Gastric ulcer, schtazkis ring dilated. Colonoscopy -multiple tubular adenomas excised.   She says she had some hydrocodone when she had a fracture on her pelvis which caused severe constipation . Was commenced on miralax, gatorate and had a clean out- never felt right after.    Today .   She says sine last week , no cramping after starting cramping.   Presently , she says that when she has a bowel movement , not able to push hard enough , when she has a bowel movement , its the consistency of soft stool; no blood. No cramping. She also says that she feels dizzy when she tries to push when she is trying to have a bowel movement . She also says that occasionally she has abdominal cramps.   She feels much better after a bowel movement   Past Medical History:  Diagnosis Date  . Adenomatous colon polyp   . Allergic rhinitis   . Asthma   . COPD (chronic obstructive pulmonary disease) ([place])   . Diabetes mellitus type II   . Diverticulosis of colon   . Esophageal stricture   . Fatty liver   . Gastritis   . GERD (gastroesophageal reflux disease)   . Gout   . History of chickenpox   . History of CVA (cerebrovascular accident) 2004    . Hyperlipidemia   . Hypertension   . Internal hemorrhoids   . Kidney tumor 11/2012  . Loss of smell 2004   due to CVA  . Periumbilical hernia   . Renal insufficiency   . Umbilical hernia     Past Surgical History:  Procedure Laterality Date  . ABDOMINAL HYSTERECTOMY    . ABDOMINAL HYSTERECTOMY    . APPENDECTOMY    . CESAREAN SECTION     x 2  . COLONOSCOPY WITH PROPOFOL N/A 07/23/2015   Procedure: COLONOSCOPY WITH PROPOFOL;  Surgeon: Manya Silvas, MD;  Location[facility] ENDOSCOPY;  Service: Endoscopy;  Laterality: N/A;  . ESOPHAGOGASTRODUODENOSCOPY (EGD) WITH PROPOFOL N/A 07/23/2015   Procedure: ESOPHAGOGASTRODUODENOSCOPY (EGD) WITH PROPOFOL;  Surgeon: Manya Silvas, MD;  Location: [facility] ENDOSCOPY;  Service: Endoscopy;  Laterality: N/A;  . HEMORROIDECTOMY      Prior to Admission medications   Medication Sig Start Date End Date Taking? Authorizing Provider  clotrimazole-betamethasone (LOTRISONE) cream Apply topically. 04/26/17  Yes [provider]  colchicine 0.6 MG tablet TAKE TWO TABLETS(1.2MG )BY MOUTH AT THE FIRST SIGN OF GOUT FLARE FOLLOWED BY ONE TABLET(0.6MG ) AFTER ONE HOUR. MAX OF 1.8MG  WITHIN ONE HOUR 03/18/17  Yes [provider]  diclofenac sodium (VOLTAREN) 1 % GEL Apply topically. 02/08/17 02/08/18 Yes [provider]  Fluticasone-Salmeterol (ADVAIR DISKUS)  250-50 MCG/DOSE AEPB Inhale into the lungs. 04/26/17 04/26/18 Yes [provider]  hyoscyamine (LEVSIN SL) 0.125 MG SL tablet Place under the tongue. 06/24/17 07/24/17 Yes [provider]  Insulin Degludec (TRESIBA FLEXTOUCH) 200 UNIT/ML SOPN Inject into the skin. 05/05/17  Yes [provider]  montelukast (SINGULAIR) 10 MG tablet Take by mouth. 06/03/17 06/03/18 Yes [provider]  pantoprazole (PROTONIX) 40 MG tablet Take by mouth. 11/23/16 11/23/17 Yes [provider]  traMADol (ULTRAM) 50 MG tablet Take by mouth. 03/02/17  Yes [provider]   albuterol (PROVENTIL HFA;VENTOLIN HFA) 108 (90 Base) MCG/ACT inhaler 2 puffs every 6 hours as needed for wheeze/ cough 05/28/15   Young, Clinton D, MD  amLODipine (NORVASC) 10 MG tablet Take 1 tablet by mouth daily. 02/21/12   [provider]  atorvastatin (LIPITOR) 40 MG tablet Take 40 mg by mouth daily.    [provider]  cefdinir (OMNICEF) 300 MG capsule  04/26/17   [provider]  dicyclomine (BENTYL) 10 MG capsule Take 1 capsule (10 mg total) by mouth every morning. Take 10 mg po BID for diarrhea, urgency 12/21/12   Esterwood, Amy S, PA-C  ergocalciferol (VITAMIN D2) 50000 UNITS capsule Take 50,000 Units by mouth every 30 (thirty) days.    [provider]  famotidine (PEPCID) 40 MG tablet Take 40 mg by mouth daily. Reported on 07/15/2015    [provider]  furosemide (LASIX) 40 MG tablet Take 1 tablet by mouth daily. 02/21/12   [provider]  glipiZIDE (GLUCOTROL XL) 5 MG 24 hr tablet Take 1 tablet (5 mg total) by mouth daily with breakfast. 07/15/15   Philemon Kingdom, MD  glucose blood (ONE TOUCH ULTRA TEST) test strip USE tID 05/22/12   Philemon Kingdom, MD  insulin aspart (NOVOLOG) 100 UNIT/ML injection Inject 6 Units into the skin daily before supper. [place] 12/05/15   Philemon Kingdom, MD  Insulin Glargine (LANTUS SOLOSTAR) 100 UNIT/ML Solostar Pen INJECT 20 UNITS IN THE MORNING AND 20 UNITS AT BEDTIME. 07/15/15   Philemon Kingdom, MD  Insulin Pen Needle (CAREFINE PEN NEEDLES) 32G X 4 MM MISC Use 2x a day 12/15/15   Philemon Kingdom, MD  labetalol (NORMODYNE) 200 MG tablet Take by mouth.    [provider]  lisinopril (PRINIVIL,ZESTRIL) 5 MG tablet Take 5 mg by mouth daily. Reported on 07/15/2015    [provider]  neomycin-polymyxin b-dexamethasone (MAXITROL) 3.5-10000-0.1 OINT  04/28/17   [provider]  PLAVIX 75 MG tablet TAKE 1 TABLET BY MOUTH ONCE A DAY 07/28/12   Janith Lima, MD  polyethylene glycol  powder (GLYCOLAX/MIRALAX) powder Take by mouth. 05/28/15   [provider]  pregabalin (LYRICA) 50 MG capsule Take 1 capsule (50 mg total) by mouth as needed. Patient taking differently: Take 50 mg by mouth daily.  10/24/12   Janith Lima, MD  ULORIC 40 MG tablet Take 80 mg by mouth daily.  11/15/12   [provider]  zolpidem (AMBIEN) 5 MG tablet TAKE 1 TABLET BY MOUTH AT BEDTIME AS NEEDED 07/20/12   Janith Lima, MD    Family History  Problem Relation Age of Onset  . GER disease Father   . Heart failure Father   . Ovarian cancer Mother   . Colon cancer Unknown        aunt  . Colon polyps Father   . Diabetes Unknown   . CAD Neg Hx   . Colon polyps Unknown  aunt     Social History   Tobacco Use  . Smoking status: Former Smoker    Packs/day: 1.00    Years: 30.00    Pack years: 30.00    Types: Cigarettes    Last attempt to quit: 01/18/2002    Years since quitting: 15.4  . Smokeless tobacco: Never Used  Substance Use Topics  . Alcohol use: No  . Drug use: No    Allergies as of 07/04/2017 - Review Complete 12/15/2015  Allergen Reaction Noted  . Crestor [rosuvastatin]  06/18/2012  . Iodinated diagnostic agents Other (See Comments) 04/17/2012  . Pioglitazone  12/24/2009  . Sulfonamide derivatives    . Losartan Swelling 08/25/2015  . Sulfasalazine Hives 11/04/2016    Review of Systems:    All systems reviewed and negative except where noted in HPI.   Physical Exam:  There were no vitals taken for this visit. No LMP recorded. Patient has had a hysterectomy. Psych:  Alert and cooperative. Normal mood and affect. General:   Alert,  Well-developed, well-nourished, pleasant and cooperative in NAD Head:  Normocephalic and atraumatic. Eyes:  Sclera clear, no icterus.   Conjunctiva pink. Ears:  Normal auditory acuity. Nose:  No deformity, discharge, or lesions. Mouth:  No deformity or lesions,oropharynx pink & moist. Neck:  Supple; no masses or  thyromegaly. Lungs:  Respirations even and unlabored.  Clear throughout to auscultation.   No wheezes, crackles, or rhonchi. No acute distress. Heart:  Regular rate and rhythm; no murmurs, clicks, rubs, or gallops. Abdomen:  Normal bowel sounds.  No bruits.  Soft, non-tender and non-distended without masses, hepatosplenomegaly or hernias noted.  No guarding or rebound tenderness.    Neurologic:  Alert and oriented x3;  grossly normal neurologically. Skin:  Intact without significant lesions or rashes. No jaundice. Lymph Nodes:  No significant cervical adenopathy. Psych:  Alert and cooperative. Normal mood and affect.  Imaging Studies: No results found.  Assessment and Plan:   Megan [name] is a 77 y.o. y/o female has been referred for abdominal discomfort. Symptoms suggestive of IBS-C.   Plan  1. Fiber supplements - samples provided 2. Amitiza 8 mcg - samples provided- if does not work will increase dose 3. IB guard- samples provided 4. Colonoscopy in 2020 for polyp surveillance  5. Stool Hpylori antigen   Follow up in 4 weeks  Dr Jonathon Bellows MD,MRCP(U.K)

## 2017-07-04 NOTE — Patient Instructions (Signed)
1. IBgard for pain as needed. 2. Fiber pills to add fiber to diet and aid in bowel movements. 3. Amitiza: take one in the morning, one in the evening for abdominal issues.  Follow-up in 4 weeks.

## 2017-07-05 DIAGNOSIS — L299 Pruritus, unspecified: Secondary | ICD-10-CM | POA: Diagnosis not present

## 2017-07-05 DIAGNOSIS — H9313 Tinnitus, bilateral: Secondary | ICD-10-CM | POA: Diagnosis not present

## 2017-07-05 DIAGNOSIS — H903 Sensorineural hearing loss, bilateral: Secondary | ICD-10-CM | POA: Diagnosis not present

## 2017-07-05 DIAGNOSIS — H9113 Presbycusis, bilateral: Secondary | ICD-10-CM | POA: Diagnosis not present

## 2017-07-05 DIAGNOSIS — M542 Cervicalgia: Secondary | ICD-10-CM | POA: Diagnosis not present

## 2017-07-07 DIAGNOSIS — M5136 Other intervertebral disc degeneration, lumbar region: Secondary | ICD-10-CM | POA: Diagnosis not present

## 2017-07-07 DIAGNOSIS — M5416 Radiculopathy, lumbar region: Secondary | ICD-10-CM | POA: Diagnosis not present

## 2017-07-11 DIAGNOSIS — R69 Illness, unspecified: Secondary | ICD-10-CM | POA: Diagnosis not present

## 2017-07-27 ENCOUNTER — Other Ambulatory Visit
Admission: RE | Admit: 2017-07-27 | Discharge: 2017-07-27 | Disposition: A | Discharge status: Home / Self Care | Payer: Medicare HMO | Source: Ambulatory Visit | Attending: Gastroenterology | Admitting: Gastroenterology

## 2017-07-27 DIAGNOSIS — N184 Chronic kidney disease, stage 4 (severe): Secondary | ICD-10-CM | POA: Diagnosis not present

## 2017-07-27 DIAGNOSIS — E1122 Type 2 diabetes mellitus with diabetic chronic kidney disease: Secondary | ICD-10-CM | POA: Diagnosis not present

## 2017-07-27 DIAGNOSIS — R109 Unspecified abdominal pain: Secondary | ICD-10-CM | POA: Diagnosis not present

## 2017-07-27 DIAGNOSIS — E1159 Type 2 diabetes mellitus with other circulatory complications: Secondary | ICD-10-CM | POA: Diagnosis not present

## 2017-07-27 DIAGNOSIS — E785 Hyperlipidemia, unspecified: Secondary | ICD-10-CM | POA: Diagnosis not present

## 2017-07-27 DIAGNOSIS — I10 Essential (primary) hypertension: Secondary | ICD-10-CM | POA: Diagnosis not present

## 2017-07-27 DIAGNOSIS — Z794 Long term (current) use of insulin: Secondary | ICD-10-CM | POA: Diagnosis not present

## 2017-07-27 DIAGNOSIS — E1169 Type 2 diabetes mellitus with other specified complication: Secondary | ICD-10-CM | POA: Diagnosis not present

## 2017-07-28 ENCOUNTER — Ambulatory Visit: Payer: Medicare HMO | Admitting: Gastroenterology

## 2017-07-28 LAB — H. PYLORI ANTIGEN, STOOL: H. Pylori Stool Ag, Eia: NEGATIVE

## 2017-07-29 ENCOUNTER — Encounter: Payer: Self-pay | Admitting: Gastroenterology

## 2017-08-01 ENCOUNTER — Ambulatory Visit (INDEPENDENT_AMBULATORY_CARE_PROVIDER_SITE_OTHER): Payer: Medicare HMO | Admitting: Gastroenterology

## 2017-08-01 ENCOUNTER — Encounter: Payer: Self-pay | Admitting: Gastroenterology

## 2017-08-01 ENCOUNTER — Encounter

## 2017-08-01 ENCOUNTER — Ambulatory Visit: Payer: Medicare HMO | Admitting: Gastroenterology

## 2017-08-01 VITALS — BP 132/71 | HR 101 | Wt 207.2 lb

## 2017-08-01 DIAGNOSIS — K581 Irritable bowel syndrome with constipation: Secondary | ICD-10-CM

## 2017-08-01 NOTE — Progress Notes (Signed)
Jonathon Bellows MD, MRCP(U.K) [address]  [place]  [place], [place] [address]  Main: [phone]  Fax: [phone]   Primary Care Physician: Tracie Harrier, MD  Primary Gastroenterologist:  Dr. Jonathon Bellows   No chief complaint on file.   HPI: Megan [name] is a 77 y.o. female   Summary of history :  She is here today to follow up to her initial visit when she was seen for abdominal pain. She has been seen by [facility] as early as 02/2017 for dysphagia,GERD,constipation, colon polyps. She had been on Oxycodone,tramadol.  At her last visit the main issue was constipation.  CT abdomen 02/2017-colonic diverticulosis.  07/2015: EGD: Gastric ulcer, schtazkis ring dilated. Colonoscopy -multiple tubular adenomas excised.      At her initial visit - had some difficulty expelling the stool . Feels much better after a bowel movement .   Interval history   07/04/2017-  08/01/2017  07/26/17- H pylori stool antigen - negative   Says no better since last visit. Says she has had this issue since her teens.    Current Outpatient Medications  Medication Sig Dispense Refill  . albuterol (PROVENTIL HFA;VENTOLIN HFA) 108 (90 Base) MCG/ACT inhaler 2 puffs every 6 hours as needed for wheeze/ cough 1 Inhaler 12  . amLODipine (NORVASC) 5 MG tablet Take 5 mg by mouth daily.  5  . atorvastatin (LIPITOR) 40 MG tablet Take 40 mg by mouth daily.    . calcitRIOL (ROCALTROL) 0.25 MCG capsule TAKE 1 CAPSULE BY MOUTH THREE TIMES A WEEK  5  . cefdinir (OMNICEF) 300 MG capsule   0  . clotrimazole-betamethasone (LOTRISONE) cream Apply topically.    . colchicine 0.6 MG tablet TAKE TWO TABLETS(1.2MG )BY MOUTH AT THE FIRST SIGN OF GOUT FLARE FOLLOWED BY ONE TABLET(0.6MG ) AFTER ONE HOUR. MAX OF 1.8MG  WITHIN ONE HOUR    . diclofenac sodium (VOLTAREN) 1 % GEL Apply topically.    . dicyclomine (BENTYL) 10 MG capsule Take 1 capsule (10 mg total) by mouth every morning. Take 10 mg po BID for diarrhea,  urgency (Patient not taking: Reported on 07/04/2017) 90 capsule 1  . ergocalciferol (VITAMIN D2) 50000 UNITS capsule Take 50,000 Units by mouth every 30 (thirty) days.    . famotidine (PEPCID) 40 MG tablet Take 40 mg by mouth daily. Reported on 07/15/2015    . Fluticasone-Salmeterol (ADVAIR DISKUS) 250-50 MCG/DOSE AEPB Inhale into the lungs.    . furosemide (LASIX) 40 MG tablet Take 1 tablet by mouth daily.    Marland Kitchen glipiZIDE (GLUCOTROL XL) 5 MG 24 hr tablet Take 1 tablet (5 mg total) by mouth daily with breakfast. 90 tablet 1  . glucose blood (ONE TOUCH ULTRA TEST) test strip USE tID 300 each 3  . hyoscyamine (LEVSIN SL) 0.125 MG SL tablet Place under the tongue.    . insulin aspart (NOVOLOG) 100 UNIT/ML injection Inject 6 Units into the skin daily before supper. Pens 15 mL 1  . Insulin Degludec (TRESIBA FLEXTOUCH) 200 UNIT/ML SOPN Inject into the skin.    . Insulin Glargine (LANTUS SOLOSTAR) 100 UNIT/ML Solostar Pen INJECT 20 UNITS IN THE MORNING AND 20 UNITS AT BEDTIME. (Patient not taking: Reported on 07/04/2017) 30 mL 1  . Insulin Pen Needle (CAREFINE PEN NEEDLES) 32G X 4 MM MISC Use 2x a day 200 each 3  . labetalol (NORMODYNE) 200 MG tablet Take by mouth.    Marland Kitchen lisinopril (PRINIVIL,ZESTRIL) 5 MG tablet Take 5 mg by mouth daily. Reported  on 07/15/2015    . montelukast (SINGULAIR) 10 MG tablet Take by mouth.    . neomycin-polymyxin b-dexamethasone (MAXITROL) 3.5-10000-0.1 OINT   0  . pantoprazole (PROTONIX) 40 MG tablet Take by mouth.    Marland Kitchen PLAVIX 75 MG tablet TAKE 1 TABLET BY MOUTH ONCE A DAY 90 tablet 2  . polyethylene glycol powder (GLYCOLAX/MIRALAX) powder Take by mouth.    . pregabalin (LYRICA) 50 MG capsule Take 1 capsule (50 mg total) by mouth as needed. (Patient taking differently: Take 50 mg by mouth daily. ) 30 capsule 8  . traMADol (ULTRAM) 50 MG tablet Take by mouth.    Marland Kitchen ULORIC 40 MG tablet Take 80 mg by mouth daily.     Marland Kitchen zolpidem (AMBIEN) 5 MG tablet TAKE 1 TABLET BY MOUTH AT BEDTIME  AS NEEDED 30 tablet 2   No current facility-administered medications for this visit.     Allergies as of 08/01/2017 - Review Complete 07/04/2017  Allergen Reaction Noted  . Crestor [rosuvastatin]  06/18/2012  . Iodinated diagnostic agents Other (See Comments) 04/17/2012  . Pioglitazone  12/24/2009  . Sulfonamide derivatives    . Losartan Swelling 08/25/2015  . Sulfasalazine Hives 11/04/2016    ROS:  General: Negative for anorexia, weight loss, fever, chills, fatigue, weakness. ENT: Negative for hoarseness, difficulty swallowing , nasal congestion. CV: Negative for chest pain, angina, palpitations, dyspnea on exertion, peripheral edema.  Respiratory: Negative for dyspnea at rest, dyspnea on exertion, cough, sputum, wheezing.  GI: See history of present illness. GU:  Negative for dysuria, hematuria, urinary incontinence, urinary frequency, nocturnal urination.  Endo: Negative for unusual weight change.    Physical Examination:   There were no vitals taken for this visit.  General: Well-nourished, well-developed in no acute distress.  Eyes: No icterus. Conjunctivae pink. Mouth: Oropharyngeal mucosa moist and pink , no lesions erythema or exudate. Lungs: Clear to auscultation bilaterally. Non-labored. Heart: Regular rate and rhythm, no murmurs rubs or gallops.  Abdomen: Bowel sounds are normal, nontender, nondistended, no hepatosplenomegaly or masses, no abdominal bruits or hernia , no rebound or guarding.   Extremities: No lower extremity edema. No clubbing or deformities. Neuro: Alert and oriented x 3.  Grossly intact. Skin: Warm and dry, no jaundice.   Psych: Alert and cooperative, normal mood and affect.   Imaging Studies: No results found.  Assessment and Plan:   Megan [name] is a 77 y.o. y/o female here to follow up for  abdominal discomfort. Symptoms suggestive of IBS-C. Did not respond to low dose amitiza and fiber pills   Plan  1. Fiber supplements - samples  provided 2. Amitiza 16 mcg - samples provided- if does not work will change to trulance  3. Colonoscopy in 2020 for polyp surveillance    Dr Jonathon Bellows  MD,MRCP [facility]) Follow up in 6 weeks

## 2017-08-13 DIAGNOSIS — R69 Illness, unspecified: Secondary | ICD-10-CM | POA: Diagnosis not present

## 2017-09-14 DIAGNOSIS — L304 Erythema intertrigo: Secondary | ICD-10-CM | POA: Diagnosis not present

## 2017-09-15 ENCOUNTER — Ambulatory Visit: Payer: Medicare HMO | Admitting: Gastroenterology

## 2017-10-06 DIAGNOSIS — M7989 Other specified soft tissue disorders: Secondary | ICD-10-CM | POA: Diagnosis not present

## 2017-10-06 DIAGNOSIS — Z794 Long term (current) use of insulin: Secondary | ICD-10-CM | POA: Diagnosis not present

## 2017-10-06 DIAGNOSIS — M79661 Pain in right lower leg: Secondary | ICD-10-CM | POA: Diagnosis not present

## 2017-10-06 DIAGNOSIS — E1122 Type 2 diabetes mellitus with diabetic chronic kidney disease: Secondary | ICD-10-CM | POA: Diagnosis not present

## 2017-10-06 DIAGNOSIS — R69 Illness, unspecified: Secondary | ICD-10-CM | POA: Diagnosis not present

## 2017-10-06 DIAGNOSIS — N184 Chronic kidney disease, stage 4 (severe): Secondary | ICD-10-CM | POA: Diagnosis not present

## 2017-10-06 DIAGNOSIS — K219 Gastro-esophageal reflux disease without esophagitis: Secondary | ICD-10-CM | POA: Diagnosis not present

## 2017-10-13 DIAGNOSIS — E1159 Type 2 diabetes mellitus with other circulatory complications: Secondary | ICD-10-CM | POA: Diagnosis not present

## 2017-10-13 DIAGNOSIS — E1122 Type 2 diabetes mellitus with diabetic chronic kidney disease: Secondary | ICD-10-CM | POA: Diagnosis not present

## 2017-10-13 DIAGNOSIS — Z6837 Body mass index (BMI) 37.0-37.9, adult: Secondary | ICD-10-CM | POA: Diagnosis not present

## 2017-10-13 DIAGNOSIS — N184 Chronic kidney disease, stage 4 (severe): Secondary | ICD-10-CM | POA: Diagnosis not present

## 2017-10-13 DIAGNOSIS — I10 Essential (primary) hypertension: Secondary | ICD-10-CM | POA: Diagnosis not present

## 2017-10-13 DIAGNOSIS — Z794 Long term (current) use of insulin: Secondary | ICD-10-CM | POA: Diagnosis not present

## 2017-10-13 DIAGNOSIS — J4540 Moderate persistent asthma, uncomplicated: Secondary | ICD-10-CM | POA: Diagnosis not present

## 2017-10-13 DIAGNOSIS — N289 Disorder of kidney and ureter, unspecified: Secondary | ICD-10-CM | POA: Diagnosis not present

## 2017-10-13 DIAGNOSIS — R110 Nausea: Secondary | ICD-10-CM | POA: Diagnosis not present

## 2017-10-25 DIAGNOSIS — E1169 Type 2 diabetes mellitus with other specified complication: Secondary | ICD-10-CM | POA: Diagnosis not present

## 2017-10-25 DIAGNOSIS — E1159 Type 2 diabetes mellitus with other circulatory complications: Secondary | ICD-10-CM | POA: Diagnosis not present

## 2017-10-25 DIAGNOSIS — N184 Chronic kidney disease, stage 4 (severe): Secondary | ICD-10-CM | POA: Diagnosis not present

## 2017-10-25 DIAGNOSIS — R69 Illness, unspecified: Secondary | ICD-10-CM | POA: Diagnosis not present

## 2017-10-25 DIAGNOSIS — Z794 Long term (current) use of insulin: Secondary | ICD-10-CM | POA: Diagnosis not present

## 2017-10-25 DIAGNOSIS — I10 Essential (primary) hypertension: Secondary | ICD-10-CM | POA: Diagnosis not present

## 2017-10-25 DIAGNOSIS — E1122 Type 2 diabetes mellitus with diabetic chronic kidney disease: Secondary | ICD-10-CM | POA: Diagnosis not present

## 2017-10-25 DIAGNOSIS — E785 Hyperlipidemia, unspecified: Secondary | ICD-10-CM | POA: Diagnosis not present

## 2017-10-31 DIAGNOSIS — H35363 Drusen (degenerative) of macula, bilateral: Secondary | ICD-10-CM | POA: Diagnosis not present

## 2017-10-31 DIAGNOSIS — E113293 Type 2 diabetes mellitus with mild nonproliferative diabetic retinopathy without macular edema, bilateral: Secondary | ICD-10-CM | POA: Diagnosis not present

## 2017-10-31 DIAGNOSIS — H35352 Cystoid macular degeneration, left eye: Secondary | ICD-10-CM | POA: Diagnosis not present

## 2017-10-31 DIAGNOSIS — Z961 Presence of intraocular lens: Secondary | ICD-10-CM | POA: Diagnosis not present

## 2017-11-03 DIAGNOSIS — R69 Illness, unspecified: Secondary | ICD-10-CM | POA: Diagnosis not present

## 2017-11-06 DIAGNOSIS — R69 Illness, unspecified: Secondary | ICD-10-CM | POA: Diagnosis not present

## 2017-11-09 DIAGNOSIS — R69 Illness, unspecified: Secondary | ICD-10-CM | POA: Diagnosis not present

## 2017-11-11 DIAGNOSIS — R69 Illness, unspecified: Secondary | ICD-10-CM | POA: Diagnosis not present

## 2017-11-23 ENCOUNTER — Encounter: Payer: Self-pay | Admitting: Emergency Medicine

## 2017-11-23 ENCOUNTER — Emergency Department: Payer: Medicare HMO

## 2017-11-23 ENCOUNTER — Other Ambulatory Visit: Payer: Self-pay

## 2017-11-23 ENCOUNTER — Emergency Department
Admission: EM | Admit: 2017-11-23 | Discharge: 2017-11-23 | Disposition: A | Discharge status: Home / Self Care | Payer: Medicare HMO | Attending: Emergency Medicine | Admitting: Emergency Medicine

## 2017-11-23 DIAGNOSIS — Z8673 Personal history of transient ischemic attack (TIA), and cerebral infarction without residual deficits: Secondary | ICD-10-CM | POA: Insufficient documentation

## 2017-11-23 DIAGNOSIS — E119 Type 2 diabetes mellitus without complications: Secondary | ICD-10-CM | POA: Diagnosis not present

## 2017-11-23 DIAGNOSIS — J45909 Unspecified asthma, uncomplicated: Secondary | ICD-10-CM | POA: Insufficient documentation

## 2017-11-23 DIAGNOSIS — Z87891 Personal history of nicotine dependence: Secondary | ICD-10-CM | POA: Insufficient documentation

## 2017-11-23 DIAGNOSIS — I160 Hypertensive urgency: Secondary | ICD-10-CM | POA: Diagnosis not present

## 2017-11-23 DIAGNOSIS — Z794 Long term (current) use of insulin: Secondary | ICD-10-CM | POA: Insufficient documentation

## 2017-11-23 DIAGNOSIS — Z79899 Other long term (current) drug therapy: Secondary | ICD-10-CM | POA: Diagnosis not present

## 2017-11-23 DIAGNOSIS — R0602 Shortness of breath: Secondary | ICD-10-CM | POA: Diagnosis not present

## 2017-11-23 DIAGNOSIS — I10 Essential (primary) hypertension: Secondary | ICD-10-CM | POA: Diagnosis present

## 2017-11-23 LAB — CBC
HEMATOCRIT: 38 % (ref 36.0–46.0)
Hemoglobin: 12.2 g/dL (ref 12.0–15.0)
MCH: 28.4 pg (ref 26.0–34.0)
MCHC: 32.1 g/dL (ref 30.0–36.0)
MCV: 88.6 fL (ref 80.0–100.0)
Platelets: 245 10*3/uL (ref 150–400)
RBC: 4.29 MIL/uL (ref 3.87–5.11)
RDW: 14.2 % (ref 11.5–15.5)
WBC: 8.4 10*3/uL (ref 4.0–10.5)
nRBC: 0 % (ref 0.0–0.2)

## 2017-11-23 LAB — BRAIN NATRIURETIC PEPTIDE: B Natriuretic Peptide: 140 pg/mL — ABNORMAL HIGH (ref 0.0–100.0)

## 2017-11-23 LAB — BASIC METABOLIC PANEL
ANION GAP: 10 (ref 5–15)
BUN: 39 mg/dL — AB (ref 8–23)
CO2: 23 mmol/L (ref 22–32)
Calcium: 9.8 mg/dL (ref 8.9–10.3)
Chloride: 103 mmol/L (ref 98–111)
Creatinine, Ser: 1.85 mg/dL — ABNORMAL HIGH (ref 0.44–1.00)
GFR calc Af Amer: 29 mL/min — ABNORMAL LOW (ref >60)
GFR, EST NON AFRICAN AMERICAN: 25 mL/min — AB (ref >60)
GLUCOSE: 183 mg/dL — AB (ref 70–99)
POTASSIUM: 4.7 mmol/L (ref 3.5–5.1)
Sodium: 136 mmol/L (ref 135–145)

## 2017-11-23 LAB — TROPONIN I

## 2017-11-23 MED ORDER — AMLODIPINE BESYLATE 5 MG PO TABS
10.0000 mg | ORAL_TABLET | ORAL | Status: AC
  Administered 2017-11-23: 10 mg via ORAL
  Filled 2017-11-23: qty 2

## 2017-11-23 NOTE — ED Triage Notes (Signed)
Pt in via POV with multiple complaints; reports intermittent shortness of breath upon exertion over the last few days.  Pt also reports elevated blood pressure and heart rate today after forgetting to take her medication.  Medication was supposed to be taken this morning, pt reports she took them around 1830 today.  Vitals WDL upon arrival, NAD noted at this time.

## 2017-11-23 NOTE — ED Provider Notes (Signed)
[facility] Emergency Department Provider Note   ____________________________________________   First MD Initiated Contact with Patient 11/23/17 2024     (approximate)  I have reviewed the triage vital signs and the nursing notes.   HISTORY  Chief Complaint Hypertension    HPI Megan [name] is a 77 y.o. female presents for evaluation for elevated blood pressure   Patient reports that multiple times in the last few days she is checked her blood pressure noted to be high, is been as high as 932 systolic over 35T on the lower number.  She reports primarily she has not noticed any symptoms with this, but she does get a little bit of a feeling earlier when she checked her blood pressure the pharmacy like her heart was racing a little bit.  No nausea vomiting.  No shortness of breath.  No fevers or chills.  No headache.  Patient reports that she wonders if this could be due to a diet change, normally very careful with her diet for the last 3 weeks however she is reports she is eating poorly, high salt foods, candy and other things that she is not normally taking in because she felt like she wanted to eat those and her husband eats that diet.  She does take lisinopril 10 mg, took that this evening around 630, she had not yet taken her amlodipine today which she normally takes in the evening and currently takes 5 mg, but in the past is taken 10 mg but cut that down about 4 months ago.  No chest pain.  She is noticed just a small amount of swelling that comes and goes about her lower ankles over the last few days as well  Past Medical History:  Diagnosis Date  . Adenomatous colon polyp   . Allergic rhinitis   . Asthma   . COPD (chronic obstructive pulmonary disease) ([place])   . Diabetes mellitus type II   . Diverticulosis of colon   . Esophageal stricture   . Fatty liver   . Gastritis   . GERD (gastroesophageal reflux disease)   . Gout   . History of  chickenpox   . History of CVA (cerebrovascular accident) 2004  . Hyperlipidemia   . Hypertension   . Internal hemorrhoids   . Kidney tumor 11/2012  . Loss of smell 2004   due to CVA  . Periumbilical hernia   . Renal insufficiency   . Umbilical hernia     Patient Active Problem List   Diagnosis Date Noted  . Obesity (BMI 30-39.9) 07/04/2017  . Stroke ([place]) 07/04/2017  . Trochanteric bursitis of right hip 02/01/2017  . Type 2 diabetes mellitus with diabetic nephropathy, with long-term current use of insulin ([place]) 07/15/2015  . Otitis, externa, infective 03/26/2014  . Hamstring injury 05/10/2013  . AKI (acute kidney injury) ([place]) 02/10/2013  . Benign hypertension 02/10/2013  . Postoperative anemia due to acute blood loss 02/09/2013  . 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 Renal mass, left 12/22/2012  . Myalgia and myositis 06/16/2012  . Unspecified vitamin D deficiency 06/16/2012  . Fatty liver disease, nonalcoholic 73/22/0254  . Family hx of colon cancer 06/14/2012  . Hx of adenomatous colonic polyps 06/14/2012  . Neck pain on right side 05/03/2012  . Coronary atherosclerosis of native coronary artery 03/17/2012  . Edema 03/17/2012  . Hx of surgical procedure 12/19/2011  . Hyperlipidemia due to type 2 diabetes mellitus ([place]) 12/14/2011  .  Other screening mammogram 11/05/2010  . 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 Diabetes mellitus type II, uncontrolled ([place]) 07/31/2010  . 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  . UTI 12/24/2009  . HYPERLIPIDEMIA 10/30/2009  . Chronic renal insufficiency, stage III (moderate) ([place]) 10/30/2009  . GERD 08/20/2008  . ESOPHAGEAL STRICTURE 03/29/2008  . Seasonal and perennial allergic rhinitis 01/18/2007  . GOUT 08/06/2006  . Hypertension, renal disease 08/06/2006  . Asthma with bronchitis 08/06/2006  . CEREBROVASCULAR ACCIDENT, HX OF 08/06/2006    Past Surgical History:  Procedure Laterality Date    . ABDOMINAL HYSTERECTOMY    . ABDOMINAL HYSTERECTOMY    . APPENDECTOMY    . CESAREAN SECTION     x 2  . COLONOSCOPY WITH PROPOFOL N/A 07/23/2015   Procedure: COLONOSCOPY WITH PROPOFOL;  Surgeon: Manya Silvas, MD;  Location: [facility] ENDOSCOPY;  Service: Endoscopy;  Laterality: N/A;  . ESOPHAGOGASTRODUODENOSCOPY (EGD) WITH PROPOFOL N/A 07/23/2015   Procedure: ESOPHAGOGASTRODUODENOSCOPY (EGD) WITH PROPOFOL;  Surgeon: Manya Silvas, MD;  Location:  [facility] ENDOSCOPY;  Service: Endoscopy;  Laterality: N/A;  . HEMORROIDECTOMY      Prior to Admission medications   Medication Sig Start Date End Date Taking? Authorizing Provider  albuterol (PROVENTIL HFA;VENTOLIN HFA) 108 (90 Base) MCG/ACT inhaler 2 puffs every 6 hours as needed for wheeze/ cough 05/28/15   Young, Clinton D, MD  amLODipine (NORVASC) 5 MG tablet Take 5 mg by mouth daily. 06/03/17   [provider]  atorvastatin (LIPITOR) 40 MG tablet Take 40 mg by mouth daily.    [provider]  calcitRIOL (ROCALTROL) 0.25 MCG capsule TAKE 1 CAPSULE BY MOUTH THREE TIMES A WEEK 06/29/17   [provider]  cefdinir (OMNICEF) 300 MG capsule  04/26/17   [provider]  clotrimazole-betamethasone (LOTRISONE) cream Apply topically. 04/26/17   [provider]  colchicine 0.6 MG tablet TAKE TWO TABLETS(1.2MG )BY MOUTH AT THE FIRST SIGN OF GOUT FLARE FOLLOWED BY ONE TABLET(0.6MG ) AFTER ONE HOUR. MAX OF 1.8MG  WITHIN ONE HOUR 03/18/17   [provider]  diclofenac sodium (VOLTAREN) 1 % GEL Apply topically. 02/08/17 02/08/18  [provider]  dicyclomine (BENTYL) 10 MG capsule Take 1 capsule (10 mg total) by mouth every morning. Take 10 mg po BID for diarrhea, urgency Patient not taking: Reported on 07/04/2017 12/21/12   Esterwood, Amy S, PA-C  ergocalciferol (VITAMIN D2) 50000 UNITS capsule Take 50,000 Units by mouth every 30 (thirty) days.    [provider]  famotidine (PEPCID) 40 MG tablet Take 40 mg  by mouth daily. Reported on 07/15/2015    [provider]  Fluticasone-Salmeterol (ADVAIR DISKUS) 250-50 MCG/DOSE AEPB Inhale into the lungs. 04/26/17 04/26/18  [provider]  furosemide (LASIX) 40 MG tablet Take 1 tablet by mouth daily. 02/21/12   [provider]  glipiZIDE (GLUCOTROL XL) 5 MG 24 hr tablet Take 1 tablet (5 mg total) by mouth daily with breakfast. 07/15/15   Philemon Kingdom, MD  glucose blood (ONE TOUCH ULTRA TEST) test strip USE tID 05/22/12   Philemon Kingdom, MD  hyoscyamine (LEVSIN SL) 0.125 MG SL tablet Place under the tongue. 06/24/17 07/24/17  [provider]  insulin aspart (NOVOLOG) 100 UNIT/ML injection Inject 6 Units into the skin daily before supper. Pens 12/05/15   Philemon Kingdom, MD  Insulin Degludec (TRESIBA FLEXTOUCH) 200 UNIT/ML SOPN Inject into the skin. 05/05/17   [provider]  Insulin Glargine (LANTUS SOLOSTAR) 100 UNIT/ML Solostar Pen INJECT 20 UNITS IN THE MORNING AND  20 UNITS AT BEDTIME. Patient not taking: Reported on 07/04/2017 07/15/15   Philemon Kingdom, MD  Insulin Pen Needle (CAREFINE PEN NEEDLES) 32G X 4 MM MISC Use 2x a day 12/15/15   Philemon Kingdom, MD  labetalol (NORMODYNE) 200 MG tablet Take by mouth.    [provider]  lisinopril (PRINIVIL,ZESTRIL) 5 MG tablet Take 5 mg by mouth daily. Reported on 07/15/2015    [provider]  montelukast (SINGULAIR) 10 MG tablet Take by mouth. 06/03/17 06/03/18  [provider]  neomycin-polymyxin b-dexamethasone (MAXITROL) 3.5-10000-0.1 OINT  04/28/17   [provider]  pantoprazole (PROTONIX) 40 MG tablet Take by mouth. 11/23/16 11/23/17  [provider]  PLAVIX 75 MG tablet TAKE 1 TABLET BY MOUTH ONCE A DAY 07/28/12   Janith Lima, MD  polyethylene glycol powder (GLYCOLAX/MIRALAX) powder Take by mouth. 05/28/15   [provider]  pregabalin (LYRICA) 50 MG capsule Take 1 capsule (50 mg total) by mouth as  needed. Patient taking differently: Take 50 mg by mouth daily.  10/24/12   Janith Lima, MD  traMADol (ULTRAM) 50 MG tablet Take by mouth. 03/02/17   [provider]  ULORIC 40 MG tablet Take 80 mg by mouth daily.  11/15/12   [provider]  zolpidem (AMBIEN) 5 MG tablet TAKE 1 TABLET BY MOUTH AT BEDTIME AS NEEDED 07/20/12   Janith Lima, MD    Allergies Crestor [rosuvastatin]; Iodinated diagnostic agents; Pioglitazone; Sulfonamide derivatives; Losartan; and Sulfasalazine  Family History  Problem Relation Age of Onset  . GER disease Father   . Heart failure Father   . Colon polyps Father   . Ovarian cancer Mother   . Colon cancer Unknown        aunt  . Diabetes Unknown   . Colon polyps Unknown        aunt  . CAD Neg Hx     Social History Social History   Tobacco Use  . Smoking status: Former Smoker    Packs/day: 1.00    Years: 30.00    Pack years: 30.00    Types: Cigarettes    Last attempt to quit: 01/18/2002    Years since quitting: 15.8  . Smokeless tobacco: Never Used  Substance Use Topics  . Alcohol use: No  . Drug use: No    Review of Systems Constitutional: No fever/chills Eyes: No visual changes. ENT: No sore throat. Cardiovascular: Denies chest pain.  Felt like her hearts been racing a little bit, she took to the pharmacy she reports and noted that on the heart monitor as her heart rate was elevated Respiratory: Denies shortness of breath. Gastrointestinal: No abdominal pain.   Genitourinary: Negative for dysuria. Musculoskeletal: Negative for back pain. Skin: Negative for rash. Neurological: Negative for headaches, areas of focal weakness or numbness.  No visual changes.    ____________________________________________   PHYSICAL EXAM:  VITAL SIGNS: ED Triage Vitals  Enc Vitals Group     BP 11/23/17 1945 (!) 169/78     Pulse Rate 11/23/17 1945 87     Resp 11/23/17 1945 16     Temp 11/23/17 1945 97.9 F (36.6 C)     Temp  Source 11/23/17 1945 Oral     SpO2 11/23/17 1945 98 %     Weight 11/23/17 1946 209 lb (94.8 kg)     Height 11/23/17 1946 5\' 3"  (1.6 m)     Head Circumference --      Peak Flow --  Pain Score 11/23/17 1946 0     Pain Loc --      Pain Edu? --      Excl. in [place]? --     Constitutional: Alert and oriented. Well appearing and in no acute distress. Eyes: Conjunctivae are normal. Head: Atraumatic. Nose: No congestion/rhinnorhea. Mouth/Throat: Mucous membranes are moist. Neck: No stridor.  Cardiovascular: Normal rate, regular rhythm. Grossly normal heart sounds.  Good peripheral circulation. Respiratory: Normal respiratory effort.  No retractions. Lungs CTAB. Gastrointestinal: Soft and nontender. No distention. Musculoskeletal: No lower extremity tenderness this is some very minimal edema in the lower ankles bilaterally Neurologic:  Normal speech and language. No gross focal neurologic deficits are appreciated.  Skin:  Skin is warm, dry and intact. No rash noted. Psychiatric: Mood and affect are normal. Speech and behavior are normal.  ____________________________________________   LABS (all labs ordered are listed, but only abnormal results are displayed)  Labs Reviewed  BASIC METABOLIC PANEL - Abnormal; Notable for the following components:      Result Value   Glucose, Bld 183 (*)    BUN 39 (*)    Creatinine, Ser 1.85 (*)    GFR calc non Af Amer 25 (*)    GFR calc Af Amer 29 (*)    All other components within normal limits  BRAIN NATRIURETIC PEPTIDE - Abnormal; Notable for the following components:   B Natriuretic Peptide 140.0 (*)    All other components within normal limits  CBC  TROPONIN I   ____________________________________________  EKG  ED ECG REPORT I, Delman Kitten, the attending physician, personally viewed and interpreted this ECG.  Date: 11/23/2017 EKG Time: 2000 Rate: 80 Rhythm: normal sinus rhythm QRS Axis: normal Intervals: normal ST/T Wave  abnormalities: normal Narrative Interpretation: no evidence of acute ischemia  ____________________________________________  RADIOLOGY  Dg Chest 2 View  Result Date: 11/23/2017 CLINICAL DATA:  Shortness of breath. EXAM: CHEST - 2 VIEW COMPARISON:  02/18/2012 CT FINDINGS: Midline trachea.  Normal heart size and mediastinal contours. Sharp costophrenic angles.  No pneumothorax.  Clear lungs. Lower lobe predominant interstitial thickening. IMPRESSION: No acute cardiopulmonary disease. Peribronchial thickening which may relate to chronic bronchitis or smoking. Electronically Signed   By: Abigail Miyamoto M.D.   On: 11/23/2017 20:21    ____________________________________________   PROCEDURES  Procedure(s) performed: None  Procedures  Critical Care performed: No  ____________________________________________   INITIAL IMPRESSION / ASSESSMENT AND PLAN / ED COURSE  Pertinent labs & imaging results that were available during my care of the patient were reviewed by me and considered in my medical decision making (see chart for details).   Patient returns for evaluation of elevated blood pressure.  Careful history taking, so she had a notable dietary change in the last 3 weeks including decreased dieting, increased salt intake and I suspect this is likely 1 of the possible etiologies for elevated blood pressure today.  She has no evidence of endorgan failure, her renal function is actually slightly improved from baseline.  She does have some very trace lower extremity edema, but it appears to be dependent in nature and not associated any evidence of CHF or pulmonary edema.  ----------------------------------------- 10:45 PM on 11/23/2017 -----------------------------------------  Troponin is normal.  CBC normal, chemistry demonstrates elevated creatinine, but better than her baseline  After patient was given amlodipine, 10 mg her blood pressure is improved to 163/60.  She reports she feels  well without complaint.  Return precautions and treatment recommendations and follow-up discussed with  the patient who is agreeable with the plan.  Patient will plan to maintain compliance with her medications, will continue on her current lisinopril dose and increase her amlodipine back to 10 mg daily and follow-up closely with her primary care doctor.       ____________________________________________   FINAL CLINICAL IMPRESSION(S) / ED DIAGNOSES  Final diagnoses:  Hypertensive urgency        Note:  This document was prepared using Dragon voice recognition software and may include unintentional dictation errors       Delman Kitten, MD 11/23/17 2308

## 2017-11-23 NOTE — ED Triage Notes (Deleted)
Pt in via POV with complaints of elevated blood pressure and pulse, reports a pulse reading of 141 while at pharmacy.  Pt reports forgetting to take blood pressure medication this morning, taking it tonight around 1830.  Pt also reports intermittent shortness of breath upon exertion x a few days.  Pt with hx of Asthma.  Vitals WDL at this time, NAD noted.

## 2017-11-23 NOTE — ED Notes (Signed)
Patient transported to X-ray 

## 2017-11-23 NOTE — ED Notes (Signed)
Pt taken to restroom.

## 2017-11-30 DIAGNOSIS — N184 Chronic kidney disease, stage 4 (severe): Secondary | ICD-10-CM | POA: Diagnosis not present

## 2017-11-30 DIAGNOSIS — E1122 Type 2 diabetes mellitus with diabetic chronic kidney disease: Secondary | ICD-10-CM | POA: Diagnosis not present

## 2017-11-30 DIAGNOSIS — D631 Anemia in chronic kidney disease: Secondary | ICD-10-CM | POA: Diagnosis not present

## 2017-11-30 DIAGNOSIS — I129 Hypertensive chronic kidney disease with stage 1 through stage 4 chronic kidney disease, or unspecified chronic kidney disease: Secondary | ICD-10-CM | POA: Diagnosis not present

## 2017-11-30 DIAGNOSIS — N2581 Secondary hyperparathyroidism of renal origin: Secondary | ICD-10-CM | POA: Diagnosis not present

## 2017-12-20 ENCOUNTER — Ambulatory Visit: Payer: Medicare HMO | Admitting: Pulmonary Disease

## 2017-12-22 DIAGNOSIS — Z0000 Encounter for general adult medical examination without abnormal findings: Secondary | ICD-10-CM | POA: Diagnosis not present

## 2017-12-23 DIAGNOSIS — R69 Illness, unspecified: Secondary | ICD-10-CM | POA: Diagnosis not present

## 2017-12-26 ENCOUNTER — Encounter: Payer: Self-pay | Admitting: Internal Medicine

## 2017-12-26 ENCOUNTER — Ambulatory Visit (INDEPENDENT_AMBULATORY_CARE_PROVIDER_SITE_OTHER): Payer: Medicare HMO | Admitting: Internal Medicine

## 2017-12-26 VITALS — BP 136/66 | HR 53 | Ht 63.0 in | Wt 223.0 lb

## 2017-12-26 DIAGNOSIS — R0600 Dyspnea, unspecified: Secondary | ICD-10-CM

## 2017-12-26 DIAGNOSIS — I10 Essential (primary) hypertension: Secondary | ICD-10-CM | POA: Diagnosis not present

## 2017-12-26 DIAGNOSIS — C649 Malignant neoplasm of unspecified kidney, except renal pelvis: Secondary | ICD-10-CM

## 2017-12-26 DIAGNOSIS — J45909 Unspecified asthma, uncomplicated: Secondary | ICD-10-CM | POA: Diagnosis not present

## 2017-12-26 LAB — BASIC METABOLIC PANEL
BUN: 38 mg/dL — ABNORMAL HIGH (ref 6–23)
CO2: 24 meq/L (ref 19–32)
Calcium: 9.4 mg/dL (ref 8.4–10.5)
Chloride: 106 mEq/L (ref 96–112)
Creatinine, Ser: 1.9 mg/dL — ABNORMAL HIGH (ref 0.40–1.20)
GFR: 32.8 mL/min — ABNORMAL LOW (ref >60.00)
Glucose, Bld: 79 mg/dL (ref 70–99)
POTASSIUM: 4.4 meq/L (ref 3.5–5.1)
Sodium: 138 mEq/L (ref 135–145)

## 2017-12-26 LAB — BRAIN NATRIURETIC PEPTIDE: Pro B Natriuretic peptide (BNP): 342 pg/mL — ABNORMAL HIGH (ref 0.0–100.0)

## 2017-12-26 MED ORDER — LEVALBUTEROL HCL 0.63 MG/3ML IN NEBU
0.6300 mg | INHALATION_SOLUTION | Freq: Once | RESPIRATORY_TRACT | Status: AC
  Administered 2017-12-26: 0.63 mg via RESPIRATORY_TRACT

## 2017-12-26 NOTE — Patient Instructions (Signed)
Order- schedule echocardiogram, dx essential hypertension, dyspnea  Order- lab- BMET, BNP, D-dimer   Dx Dyspnea, essential hypertension  Order- neb xop 0.63    dX dyspnea  Please make appointments to see your primary doctor and your kidney doctor so they can check out the discomforts and changes you have been noticing

## 2017-12-26 NOTE — Progress Notes (Signed)
HPI female former smoker followed for allergic rhinitis, asthma/COPD, history urticaria, complicated by , DM, GERD, history  CVA 2004.MRI brain 2015. Kidney cancer/ partial nephrectomy 2015  PFT 10/26/2011 mild obstructive airways disease, small airways, insignificant response to bronchodilator. Diffusion moderately reduced. FVC 2.46/92%, FEV1 1.76/94%, FEV1/FVC 0.71, FEF 25/75% 1.13/52%. TLC 86%, DLCO 46%. 6 MWT-99%, 97%, 99%, 297.5 m with complaint of right calf pain but no oxygen limitation -----------------------------------------------------------------------------------------------------------  05/28/2015-77 year old female former smoker followed for allergic rhinitis, asthma/COPD, history urticaria, complicated by kidney cancer, DM, GERD, history CVA             husband here FOLLOWS FOR: States that she has been "congested" for the few months. Saw her PCP last week and was given Ampicillin. Congestion has moved to her chest. Had flu syndrome in January. Since then had persistent head congestion, nasal stuffiness, cough treated by PCP. Symptoms never completely resolved. Now stuffy nose is moving into chest with cough, little sputum. Has been on amoxicillin for 7 of 10 days.  12/26/2017- 77 year old female former smoker followed for allergic rhinitis, asthma/COPD, history urticaria, complicated by kidney cancer, DM2, GERD, history CVA, HBP            husband here -----last seen 05/2015- c/o increased wheezing, sob X few weeks.  denies cough, mucus production.  Advair 250, albuterol HFA, Note also lisinopril, bisoprolol, Daughter was present by phone speaker - mic.Marland Kitchen To ED this fall for HBP. Fairly sudden increased dyspnea on exertion walking room to room.  Nephrologist stopped her bisoprolol.  Sputum turned yellow-brown after flu shot.  She feels she is swelling.  Denies fever.  No history of DVT and she continues Plavix.  Today some vague soreness in the left upper quadrant of her abdomen without  nausea, vomiting or bowel change. CXR 11/23/2017- No acute cardiopulmonary disease. Peribronchial thickening which may relate to chronic bronchitis or smoking. Marland Kitchen ROS- see HPI    + = positive Constitutional:   No-   weight loss, night sweats, fevers, chills, fatigue, lassitude. HEENT:   No-  headaches, difficulty swallowing, tooth/dental problems, sore throat,       No-  sneezing, itching, ear ache, +nasal congestion, post nasal drip,  CV:  noncardiac chest pain, no-orthopnea, PND, swelling in lower extremities, anasarca,                                       dizziness, No-palpitations Resp: + shortness of breath with exertion or at rest.                +productive cough,  No non-productive cough,  No-  coughing up of blood.               change in color of mucus.  No- wheezing.   Skin: No-   rash or lesions. GI:  No-   heartburn, indigestion, abdominal pain, nausea, vomiting,  GU: MS:  +  joint pain or swelling.  . Neuro- nothing unusual Psych:  No- change in mood or affect. No depression or anxiety.  No memory loss.  OBJ General- Alert, Oriented, Affect-appropriate, Distress- none acute, Obese, talkative . Wearing a paper facemask today .  Skin- rash-none, lesions- none, excoriation- none Lymphadenopathy- none Head- atraumatic            Eyes- Gross vision intact, PERRLA, conjunctivae clear secretions  Ears- hearing intact            Nose- +turbinate edema, no-Septal dev, +mucus bridging, no-polyps, erosion, perforation             Throat- Mallampati III , mucosa clear , drainage- none, tonsils- atrophic, + mild hoarseness Neck- flexible , trachea midline, no stridor , thyroid nl, carotid no bruit Chest - symmetrical excursion , unlabored           Heart/CV- RRR , no murmur , no gallop  , no rub, nl s1 s2                           - JVD+full, +1 bilat edema, no-stasis changes- none, varices- none           Lung- clear to P&A, wheeze-none, cough- none , dullness-none, rub-  none           Chest wall-  Abd- ?firmness LUQ w/o guarding Br/ Gen/ Rectal- Not done, not indicated Extrem- cyanosis- none, clubbing, none, atrophy- none, strength- nl. these Neuro- grossly intact to observation

## 2017-12-27 ENCOUNTER — Other Ambulatory Visit: Payer: Self-pay | Admitting: Internal Medicine

## 2017-12-27 ENCOUNTER — Ambulatory Visit
Admission: RE | Admit: 2017-12-27 | Discharge: 2017-12-27 | Disposition: A | Discharge status: Home / Self Care | Payer: Medicare HMO | Source: Ambulatory Visit | Attending: Internal Medicine | Admitting: Internal Medicine

## 2017-12-27 DIAGNOSIS — M7122 Synovial cyst of popliteal space [Baker], left knee: Secondary | ICD-10-CM | POA: Insufficient documentation

## 2017-12-27 DIAGNOSIS — I10 Essential (primary) hypertension: Secondary | ICD-10-CM | POA: Diagnosis not present

## 2017-12-27 DIAGNOSIS — Z794 Long term (current) use of insulin: Secondary | ICD-10-CM | POA: Diagnosis not present

## 2017-12-27 DIAGNOSIS — R600 Localized edema: Secondary | ICD-10-CM

## 2017-12-27 DIAGNOSIS — E1159 Type 2 diabetes mellitus with other circulatory complications: Secondary | ICD-10-CM | POA: Diagnosis not present

## 2017-12-27 DIAGNOSIS — R0602 Shortness of breath: Secondary | ICD-10-CM | POA: Diagnosis not present

## 2017-12-27 DIAGNOSIS — N184 Chronic kidney disease, stage 4 (severe): Secondary | ICD-10-CM | POA: Diagnosis not present

## 2017-12-27 DIAGNOSIS — E1122 Type 2 diabetes mellitus with diabetic chronic kidney disease: Secondary | ICD-10-CM | POA: Diagnosis not present

## 2017-12-27 DIAGNOSIS — J4540 Moderate persistent asthma, uncomplicated: Secondary | ICD-10-CM | POA: Diagnosis not present

## 2017-12-27 LAB — D-DIMER, QUANTITATIVE: D-Dimer, Quant: 0.92 mcg/mL FEU — ABNORMAL HIGH (ref <0.50)

## 2017-12-28 ENCOUNTER — Other Ambulatory Visit: Payer: Self-pay | Admitting: Internal Medicine

## 2017-12-28 DIAGNOSIS — R7989 Other specified abnormal findings of blood chemistry: Secondary | ICD-10-CM

## 2017-12-28 DIAGNOSIS — R69 Illness, unspecified: Secondary | ICD-10-CM | POA: Diagnosis not present

## 2017-12-29 ENCOUNTER — Ambulatory Visit
Admission: RE | Admit: 2017-12-29 | Discharge: 2017-12-29 | Disposition: A | Discharge status: Home / Self Care | Payer: Medicare HMO | Source: Ambulatory Visit | Attending: Internal Medicine | Admitting: Internal Medicine

## 2017-12-29 DIAGNOSIS — R0602 Shortness of breath: Secondary | ICD-10-CM | POA: Diagnosis not present

## 2017-12-29 DIAGNOSIS — R7989 Other specified abnormal findings of blood chemistry: Secondary | ICD-10-CM

## 2017-12-29 DIAGNOSIS — R69 Illness, unspecified: Secondary | ICD-10-CM | POA: Diagnosis not present

## 2017-12-29 MED ORDER — TECHNETIUM TC 99M DIETHYLENETRIAME-PENTAACETIC ACID
33.7400 | Freq: Once | INTRAVENOUS | Status: AC | PRN
  Administered 2017-12-29: 33.74 via INTRAVENOUS

## 2017-12-29 MED ORDER — TECHNETIUM TO 99M ALBUMIN AGGREGATED
4.0000 | Freq: Once | INTRAVENOUS | Status: AC | PRN
  Administered 2017-12-29: 4.7 via INTRAVENOUS

## 2017-12-30 ENCOUNTER — Ambulatory Visit (HOSPITAL_COMMUNITY): Payer: Medicare HMO | Attending: Cardiology

## 2017-12-30 ENCOUNTER — Other Ambulatory Visit: Payer: Self-pay

## 2017-12-30 DIAGNOSIS — R0600 Dyspnea, unspecified: Secondary | ICD-10-CM | POA: Diagnosis not present

## 2017-12-30 DIAGNOSIS — I10 Essential (primary) hypertension: Secondary | ICD-10-CM

## 2018-01-03 ENCOUNTER — Telehealth: Payer: Self-pay | Admitting: Internal Medicine

## 2018-01-03 NOTE — Telephone Encounter (Signed)
Spoke with pt, advised her that her echo was resulted but I would forward it to CY to review. Please advise.

## 2018-01-03 NOTE — Telephone Encounter (Signed)
Attempted to call pt but unable to reach her and unable to leave a VM due to no machine being set up. Will try to call back later.

## 2018-01-03 NOTE — Telephone Encounter (Signed)
Echocardiogram showed normal heart muscle pumping power. The muscle is a little stiff, so it relaxes just a bit slowly. Also one of the heart valves was a little thickened. Neither of these is a major problem, but together they may reduce heart function a bit. There is nothing that needs to be done now.

## 2018-01-04 NOTE — Telephone Encounter (Signed)
Spoke with patient-aware we have made appt for CY on 01-16-18 at 4:30pm. Nothing more needed at this time.

## 2018-01-04 NOTE — Telephone Encounter (Signed)
Called and spoke with pt letting her know the results of the echo. Pt expressed understanding but asked me why she was having a hard time with her breathing.  I asked pt if she wanted to come in for an appt to further discuss this with CY and she stated she did. CY does have some openings tomorrow, 12/19 but pt states she needs at least two days notice to be able to find transportation.  Pt does only want to see CY. Katie and Dr. Annamaria Boots, please advise if there is any way we can get pt in for an appt. Thanks!

## 2018-01-04 NOTE — Telephone Encounter (Signed)
Katie- please see what we can do to see her in the next 2 weeks

## 2018-01-06 DIAGNOSIS — Z794 Long term (current) use of insulin: Secondary | ICD-10-CM | POA: Diagnosis not present

## 2018-01-06 DIAGNOSIS — R110 Nausea: Secondary | ICD-10-CM | POA: Diagnosis not present

## 2018-01-06 DIAGNOSIS — N289 Disorder of kidney and ureter, unspecified: Secondary | ICD-10-CM | POA: Diagnosis not present

## 2018-01-06 DIAGNOSIS — E1122 Type 2 diabetes mellitus with diabetic chronic kidney disease: Secondary | ICD-10-CM | POA: Diagnosis not present

## 2018-01-06 DIAGNOSIS — J4540 Moderate persistent asthma, uncomplicated: Secondary | ICD-10-CM | POA: Diagnosis not present

## 2018-01-06 DIAGNOSIS — I10 Essential (primary) hypertension: Secondary | ICD-10-CM | POA: Diagnosis not present

## 2018-01-06 DIAGNOSIS — E1159 Type 2 diabetes mellitus with other circulatory complications: Secondary | ICD-10-CM | POA: Diagnosis not present

## 2018-01-06 DIAGNOSIS — N184 Chronic kidney disease, stage 4 (severe): Secondary | ICD-10-CM | POA: Diagnosis not present

## 2018-01-06 DIAGNOSIS — Z6837 Body mass index (BMI) 37.0-37.9, adult: Secondary | ICD-10-CM | POA: Diagnosis not present

## 2018-01-13 DIAGNOSIS — R0789 Other chest pain: Secondary | ICD-10-CM | POA: Diagnosis not present

## 2018-01-13 DIAGNOSIS — E1122 Type 2 diabetes mellitus with diabetic chronic kidney disease: Secondary | ICD-10-CM | POA: Diagnosis not present

## 2018-01-13 DIAGNOSIS — Z794 Long term (current) use of insulin: Secondary | ICD-10-CM | POA: Diagnosis not present

## 2018-01-13 DIAGNOSIS — R69 Illness, unspecified: Secondary | ICD-10-CM | POA: Diagnosis not present

## 2018-01-13 DIAGNOSIS — J4540 Moderate persistent asthma, uncomplicated: Secondary | ICD-10-CM | POA: Diagnosis not present

## 2018-01-13 DIAGNOSIS — R0609 Other forms of dyspnea: Secondary | ICD-10-CM | POA: Diagnosis not present

## 2018-01-13 DIAGNOSIS — N184 Chronic kidney disease, stage 4 (severe): Secondary | ICD-10-CM | POA: Diagnosis not present

## 2018-01-13 DIAGNOSIS — Z0000 Encounter for general adult medical examination without abnormal findings: Secondary | ICD-10-CM | POA: Diagnosis not present

## 2018-01-13 DIAGNOSIS — E669 Obesity, unspecified: Secondary | ICD-10-CM | POA: Diagnosis not present

## 2018-01-16 ENCOUNTER — Encounter: Payer: Self-pay | Admitting: Internal Medicine

## 2018-01-16 ENCOUNTER — Ambulatory Visit (INDEPENDENT_AMBULATORY_CARE_PROVIDER_SITE_OTHER): Payer: Medicare HMO | Admitting: Internal Medicine

## 2018-01-16 VITALS — BP 124/74 | HR 68 | Ht 63.0 in | Wt 215.8 lb

## 2018-01-16 DIAGNOSIS — R0609 Other forms of dyspnea: Secondary | ICD-10-CM | POA: Insufficient documentation

## 2018-01-16 DIAGNOSIS — E669 Obesity, unspecified: Secondary | ICD-10-CM | POA: Diagnosis not present

## 2018-01-16 DIAGNOSIS — R0600 Dyspnea, unspecified: Secondary | ICD-10-CM

## 2018-01-16 NOTE — Assessment & Plan Note (Signed)
Orbitally obese and deconditioned.  I explained the mechanics of obesity hypoventilation supported her daughter is interested in encoouraging mother to walk some in the neighborhood.

## 2018-01-16 NOTE — Progress Notes (Signed)
HPI female former smoker followed for allergic rhinitis, asthma/COPD, history urticaria, complicated by , DM, GERD, history  CVA 2004.MRI brain 2015. Kidney cancer/ partial nephrectomy 2015  PFT 10/26/2011 mild obstructive airways disease, small airways, insignificant response to bronchodilator. Diffusion moderately reduced. FVC 2.46/92%, FEV1 1.76/94%, FEV1/FVC 0.71, FEF 25/75% 1.13/52%. TLC 86%, DLCO 46%. 6 MWT-99%, 97%, 99%, 297.5 m with complaint of right calf pain but no oxygen limitation V/Q scan 12/29/17- No appreciable ventilation or perfusion defects. Very low probability of pulmonary embolus. CXR 12/29/17- No acute disease. US venous ultrasound-12/27/17- 1. No femoropopliteal and no calf DVT in the visualized calf veins. If clinical symptoms are inconsistent or if there are persistent or worsening symptoms, further imaging (possibly involving the iliac veins) may be warranted. 2. Small left Baker's cyst. ECHOcardiogram-12/30/2017-EF 62-95%, grade 1 diastolic dysfunction, restricted mobility aortic valve, mild mitral regurgitation.  No pulmonary hypertension. Lab 12/26/2017-BUN 38, creatinine 1.90, BNP-242 H, D-dimer 0.92 H -----------------------------------------------------------------------------------------------------------  05/28/2015-77 year old female former smoker followed for allergic rhinitis, asthma/COPD, history urticaria, complicated by kidney cancer, DM, GERD, history CVA             husband here FOLLOWS FOR: States that she has been "congested" for the few months. Saw her PCP last week and was given Ampicillin. Congestion has moved to her chest. Had flu syndrome in January. Since then had persistent head congestion, nasal stuffiness, cough treated by PCP. Symptoms never completely resolved. Now stuffy nose is moving into chest with cough, little sputum. Has been on amoxicillin for 7 of 10 days.  12/26/2017- 77 year old female former smoker followed for allergic rhinitis,  asthma/COPD, history urticaria, complicated by kidney cancer, DM2, GERD, history CVA, HBP            husband here -----last seen 05/2015- c/o increased wheezing, sob X few weeks.  denies cough, mucus production.  Advair 250, albuterol HFA, Note also lisinopril, bisoprolol, Daughter was present by phone speaker Ronalee Belts.  CXR 11/23/2017- No acute cardiopulmonary disease. Peribronchial thickening which may relate to chronic bronchitis or Smoking.  01/16/2018- 77 year old female former smoker followed for allergic rhinitis, asthma/COPD, history urticaria, complicated by kidney cancer, DM2, GERD, history CVA, HBP   -----Asthma: Pt here to reveiw Echo results in detail.  V/Q scan 12/29/17- No appreciable ventilation or perfusion defects. Very low probability of pulmonary embolus. CXR 12/29/17- No acute disease. US venous ultrasound-12/27/17- 1. No femoropopliteal and no calf DVT in the visualized calf veins. If clinical symptoms are inconsistent or if there are persistent or worsening symptoms, further imaging (possibly involving the iliac veins) may be warranted. 2. Small left Baker's cyst. ECHOcardiogram-12/30/2017-EF 28-41%, grade 1 diastolic dysfunction, restricted mobility aortic valve, mild mitral regurgitation.  No pulmonary hypertension. Lab 12/26/2017-BUN 38, creatinine 1.90, BNP-242 H, D-dimer 0.92 H Since last here has lost 8 pounds consistent with diuresis, and feeling better. Body weight today 215 pounds,  O2 saturation 94% at rest on room air  ROS- see HPI   + = positive Constitutional:  + weight loss, night sweats, fevers, chills, fatigue, lassitude. HEENT:   No-  headaches, difficulty swallowing, tooth/dental problems, sore throat,       No-  sneezing, itching, ear ache, +nasal congestion, post nasal drip,  CV:  noncardiac chest pain, no-orthopnea, PND, swelling in lower extremities, anasarca,                                   dizziness, No-palpitations Resp: +  shortness of breath  with exertion or at rest.                productive cough,  No non-productive cough,  No-  coughing up of blood.               change in color of mucus.  No- wheezing.   Skin: No-   rash or lesions. GI:  No-   heartburn, indigestion, abdominal pain, nausea, vomiting,  GU: MS:  +  joint pain or swelling.  . Neuro- nothing unusual Psych:  No- change in mood or affect. No depression or anxiety.  No memory loss.  OBJ General- Alert, Oriented, Affect-appropriate, Distress- none acute, Obese, talkative ..  Skin- rash-none, lesions- none, excoriation- none Lymphadenopathy- none Head- atraumatic            Eyes- Gross vision intact, PERRLA, conjunctivae clear secretions            Ears- hearing intact            Nose- +turbinate edema, no-Septal dev, +mucus bridging, no-polyps, erosion, perforation             Throat- Mallampati III , mucosa clear , drainage- none, tonsils- atrophic, + mild hoarseness Neck- flexible , trachea midline, no stridor , thyroid nl, carotid no bruit Chest - symmetrical excursion , unlabored           Heart/CV- RRR , no murmur , no gallop  , no rub, nl s1 s2                           - JVD- none ,  Edema- none, stasis changes- none, varices- none           Lung- clear to P&A, wheeze-none, cough- none , dullness-none, rub- none           Chest wall-  Abd-  Br/ Gen/ Rectal- Not done, not indicated Extrem- cyanosis- none, clubbing, none, atrophy- none, strength- nl. these Neuro- grossly intact to observation

## 2018-01-16 NOTE — Assessment & Plan Note (Signed)
Moderately elevated d-dimer was followed up with VQ scan and leg vein ultrasound finding no evidence of clot. Elevated BNP assistant with fluid overload supports impression that rapid weight loss with diuresis was the basis for her improvement in dyspnea at this visit.  I explained that her kidney function requires very careful management of fluid balance for which I am directing her to her nephrologist, primary physician and cardiology.

## 2018-01-16 NOTE — Patient Instructions (Signed)
I'm glad you are feeling better   Please follow closely with your kidney doctor and primary doctor, to keep track of your fluid status, and try to lose a few pounds.  Please call us if needed

## 2018-01-17 DIAGNOSIS — Z1231 Encounter for screening mammogram for malignant neoplasm of breast: Secondary | ICD-10-CM | POA: Diagnosis not present

## 2018-01-23 DIAGNOSIS — D631 Anemia in chronic kidney disease: Secondary | ICD-10-CM | POA: Diagnosis not present

## 2018-01-23 DIAGNOSIS — R0602 Shortness of breath: Secondary | ICD-10-CM | POA: Diagnosis not present

## 2018-01-23 DIAGNOSIS — R079 Chest pain, unspecified: Secondary | ICD-10-CM | POA: Diagnosis not present

## 2018-01-23 DIAGNOSIS — Z794 Long term (current) use of insulin: Secondary | ICD-10-CM | POA: Diagnosis not present

## 2018-01-23 DIAGNOSIS — E785 Hyperlipidemia, unspecified: Secondary | ICD-10-CM | POA: Diagnosis not present

## 2018-01-23 DIAGNOSIS — E1122 Type 2 diabetes mellitus with diabetic chronic kidney disease: Secondary | ICD-10-CM | POA: Diagnosis not present

## 2018-01-23 DIAGNOSIS — R809 Proteinuria, unspecified: Secondary | ICD-10-CM | POA: Diagnosis not present

## 2018-01-23 DIAGNOSIS — N2581 Secondary hyperparathyroidism of renal origin: Secondary | ICD-10-CM | POA: Diagnosis not present

## 2018-01-23 DIAGNOSIS — I129 Hypertensive chronic kidney disease with stage 1 through stage 4 chronic kidney disease, or unspecified chronic kidney disease: Secondary | ICD-10-CM | POA: Diagnosis not present

## 2018-01-23 DIAGNOSIS — E1159 Type 2 diabetes mellitus with other circulatory complications: Secondary | ICD-10-CM | POA: Diagnosis not present

## 2018-01-23 DIAGNOSIS — Z9889 Other specified postprocedural states: Secondary | ICD-10-CM | POA: Diagnosis not present

## 2018-01-23 DIAGNOSIS — I10 Essential (primary) hypertension: Secondary | ICD-10-CM | POA: Diagnosis not present

## 2018-01-23 DIAGNOSIS — E1169 Type 2 diabetes mellitus with other specified complication: Secondary | ICD-10-CM | POA: Diagnosis not present

## 2018-01-23 DIAGNOSIS — N184 Chronic kidney disease, stage 4 (severe): Secondary | ICD-10-CM | POA: Diagnosis not present

## 2018-01-27 DIAGNOSIS — R69 Illness, unspecified: Secondary | ICD-10-CM | POA: Diagnosis not present

## 2018-02-02 ENCOUNTER — Telehealth: Payer: Self-pay | Admitting: Gastroenterology

## 2018-02-02 NOTE — Telephone Encounter (Signed)
Pt is calling to see if Dr. Vicente Males  Would prescribe her rx panpoprazole 40 mg for Acid reflux send to [address]  She will be going out of town for  2 month so she needs this

## 2018-02-02 NOTE — Telephone Encounter (Signed)
Spoke with pt and informed her that we cannot refill the pantoprazole if she has transferred her care back to [place]. I explained to pt that she should contact [place] or her PCP for the medication refill. Pt understands and agrees.

## 2018-02-14 ENCOUNTER — Telehealth: Payer: Self-pay | Admitting: Internal Medicine

## 2018-02-14 DIAGNOSIS — C642 Malignant neoplasm of left kidney, except renal pelvis: Secondary | ICD-10-CM | POA: Diagnosis not present

## 2018-02-14 NOTE — Telephone Encounter (Signed)
Spoke with Engineer, civil (consulting) at [facility]. Pt is requesting to have her last OV note sent to her kidney doctor in [place] note has been faxed. Nothing further was needed.

## 2018-02-14 NOTE — Telephone Encounter (Signed)
Spoke with pt. Advised her that I have refaxed her office visit notes to the number provided.

## 2018-02-21 ENCOUNTER — Other Ambulatory Visit: Payer: Self-pay | Admitting: Urology

## 2018-02-21 DIAGNOSIS — C642 Malignant neoplasm of left kidney, except renal pelvis: Secondary | ICD-10-CM

## 2018-02-22 ENCOUNTER — Ambulatory Visit
Admission: RE | Admit: 2018-02-22 | Discharge: 2018-02-22 | Disposition: A | Discharge status: Home / Self Care | Payer: Medicare (Managed Care) | Source: Ambulatory Visit | Attending: Urology | Admitting: Urology

## 2018-02-22 DIAGNOSIS — N281 Cyst of kidney, acquired: Secondary | ICD-10-CM | POA: Diagnosis not present

## 2018-02-22 DIAGNOSIS — C642 Malignant neoplasm of left kidney, except renal pelvis: Secondary | ICD-10-CM | POA: Diagnosis not present

## 2018-02-27 DIAGNOSIS — C642 Malignant neoplasm of left kidney, except renal pelvis: Secondary | ICD-10-CM | POA: Diagnosis not present

## 2018-03-01 DIAGNOSIS — R69 Illness, unspecified: Secondary | ICD-10-CM | POA: Diagnosis not present

## 2018-03-02 DIAGNOSIS — E7800 Pure hypercholesterolemia, unspecified: Secondary | ICD-10-CM | POA: Diagnosis not present

## 2018-03-02 DIAGNOSIS — I129 Hypertensive chronic kidney disease with stage 1 through stage 4 chronic kidney disease, or unspecified chronic kidney disease: Secondary | ICD-10-CM | POA: Diagnosis not present

## 2018-03-02 DIAGNOSIS — E875 Hyperkalemia: Secondary | ICD-10-CM | POA: Diagnosis not present

## 2018-03-02 DIAGNOSIS — M109 Gout, unspecified: Secondary | ICD-10-CM | POA: Diagnosis not present

## 2018-03-02 DIAGNOSIS — N2581 Secondary hyperparathyroidism of renal origin: Secondary | ICD-10-CM | POA: Diagnosis not present

## 2018-03-02 DIAGNOSIS — E559 Vitamin D deficiency, unspecified: Secondary | ICD-10-CM | POA: Diagnosis not present

## 2018-03-02 DIAGNOSIS — R809 Proteinuria, unspecified: Secondary | ICD-10-CM | POA: Diagnosis not present

## 2018-03-02 DIAGNOSIS — E872 Acidosis: Secondary | ICD-10-CM | POA: Diagnosis not present

## 2018-03-02 DIAGNOSIS — C642 Malignant neoplasm of left kidney, except renal pelvis: Secondary | ICD-10-CM | POA: Diagnosis not present

## 2018-03-02 DIAGNOSIS — N184 Chronic kidney disease, stage 4 (severe): Secondary | ICD-10-CM | POA: Diagnosis not present

## 2018-03-06 DIAGNOSIS — R69 Illness, unspecified: Secondary | ICD-10-CM | POA: Diagnosis not present

## 2018-03-10 DIAGNOSIS — E119 Type 2 diabetes mellitus without complications: Secondary | ICD-10-CM | POA: Diagnosis not present

## 2018-03-10 NOTE — Assessment & Plan Note (Signed)
Recent exacerbation consistent with a bronchitis, noting sputum color change.  Symptoms are nonspecific and I want to rule out PE and CHF as well. Plan-nebulizer treatment Xopenex, echocardiogram, labs for BMET, BNP, D-dimer

## 2018-03-10 NOTE — Assessment & Plan Note (Signed)
Recent nonspecific left upper quadrant discomfort.  I have asked her to follow this up with her PCP and nephrologist given her history.

## 2018-03-13 DIAGNOSIS — R110 Nausea: Secondary | ICD-10-CM | POA: Diagnosis not present

## 2018-03-13 DIAGNOSIS — K219 Gastro-esophageal reflux disease without esophagitis: Secondary | ICD-10-CM | POA: Diagnosis not present

## 2018-03-13 DIAGNOSIS — K5900 Constipation, unspecified: Secondary | ICD-10-CM | POA: Diagnosis not present

## 2018-03-16 DIAGNOSIS — E785 Hyperlipidemia, unspecified: Secondary | ICD-10-CM | POA: Diagnosis not present

## 2018-03-16 DIAGNOSIS — Z794 Long term (current) use of insulin: Secondary | ICD-10-CM | POA: Diagnosis not present

## 2018-03-16 DIAGNOSIS — N184 Chronic kidney disease, stage 4 (severe): Secondary | ICD-10-CM | POA: Diagnosis not present

## 2018-03-16 DIAGNOSIS — E1122 Type 2 diabetes mellitus with diabetic chronic kidney disease: Secondary | ICD-10-CM | POA: Diagnosis not present

## 2018-03-16 DIAGNOSIS — E1169 Type 2 diabetes mellitus with other specified complication: Secondary | ICD-10-CM | POA: Diagnosis not present

## 2018-03-16 DIAGNOSIS — E1159 Type 2 diabetes mellitus with other circulatory complications: Secondary | ICD-10-CM | POA: Diagnosis not present

## 2018-03-16 DIAGNOSIS — I10 Essential (primary) hypertension: Secondary | ICD-10-CM | POA: Diagnosis not present

## 2018-03-20 DIAGNOSIS — Z6837 Body mass index (BMI) 37.0-37.9, adult: Secondary | ICD-10-CM | POA: Diagnosis not present

## 2018-03-20 DIAGNOSIS — J209 Acute bronchitis, unspecified: Secondary | ICD-10-CM | POA: Diagnosis not present

## 2018-03-20 DIAGNOSIS — I10 Essential (primary) hypertension: Secondary | ICD-10-CM | POA: Diagnosis not present

## 2018-03-20 DIAGNOSIS — K219 Gastro-esophageal reflux disease without esophagitis: Secondary | ICD-10-CM | POA: Diagnosis not present

## 2018-03-20 DIAGNOSIS — J4540 Moderate persistent asthma, uncomplicated: Secondary | ICD-10-CM | POA: Diagnosis not present

## 2018-03-20 DIAGNOSIS — E1159 Type 2 diabetes mellitus with other circulatory complications: Secondary | ICD-10-CM | POA: Diagnosis not present

## 2018-03-20 DIAGNOSIS — R0602 Shortness of breath: Secondary | ICD-10-CM | POA: Diagnosis not present

## 2018-03-23 DIAGNOSIS — H47393 Other disorders of optic disc, bilateral: Secondary | ICD-10-CM | POA: Diagnosis not present

## 2018-03-23 DIAGNOSIS — H04123 Dry eye syndrome of bilateral lacrimal glands: Secondary | ICD-10-CM | POA: Diagnosis not present

## 2018-03-23 DIAGNOSIS — H40013 Open angle with borderline findings, low risk, bilateral: Secondary | ICD-10-CM | POA: Diagnosis not present

## 2018-03-23 DIAGNOSIS — H1013 Acute atopic conjunctivitis, bilateral: Secondary | ICD-10-CM | POA: Diagnosis not present

## 2018-03-27 DIAGNOSIS — R69 Illness, unspecified: Secondary | ICD-10-CM | POA: Diagnosis not present

## 2018-03-30 ENCOUNTER — Ambulatory Visit: Payer: Medicare HMO | Admitting: Internal Medicine

## 2018-03-31 DIAGNOSIS — R69 Illness, unspecified: Secondary | ICD-10-CM | POA: Diagnosis not present

## 2018-04-19 DIAGNOSIS — R809 Proteinuria, unspecified: Secondary | ICD-10-CM | POA: Diagnosis not present

## 2018-04-19 DIAGNOSIS — E1122 Type 2 diabetes mellitus with diabetic chronic kidney disease: Secondary | ICD-10-CM | POA: Diagnosis not present

## 2018-04-19 DIAGNOSIS — I129 Hypertensive chronic kidney disease with stage 1 through stage 4 chronic kidney disease, or unspecified chronic kidney disease: Secondary | ICD-10-CM | POA: Diagnosis not present

## 2018-04-19 DIAGNOSIS — N184 Chronic kidney disease, stage 4 (severe): Secondary | ICD-10-CM | POA: Diagnosis not present

## 2018-04-28 DIAGNOSIS — R69 Illness, unspecified: Secondary | ICD-10-CM | POA: Diagnosis not present

## 2018-05-19 DIAGNOSIS — E1122 Type 2 diabetes mellitus with diabetic chronic kidney disease: Secondary | ICD-10-CM | POA: Diagnosis not present

## 2018-05-19 DIAGNOSIS — E1165 Type 2 diabetes mellitus with hyperglycemia: Secondary | ICD-10-CM | POA: Diagnosis not present

## 2018-05-19 DIAGNOSIS — J45909 Unspecified asthma, uncomplicated: Secondary | ICD-10-CM | POA: Diagnosis not present

## 2018-05-19 DIAGNOSIS — E11620 Type 2 diabetes mellitus with diabetic dermatitis: Secondary | ICD-10-CM | POA: Diagnosis not present

## 2018-05-19 DIAGNOSIS — Z794 Long term (current) use of insulin: Secondary | ICD-10-CM | POA: Diagnosis not present

## 2018-05-19 DIAGNOSIS — I129 Hypertensive chronic kidney disease with stage 1 through stage 4 chronic kidney disease, or unspecified chronic kidney disease: Secondary | ICD-10-CM | POA: Diagnosis not present

## 2018-05-19 DIAGNOSIS — E785 Hyperlipidemia, unspecified: Secondary | ICD-10-CM | POA: Diagnosis not present

## 2018-05-19 DIAGNOSIS — I69351 Hemiplegia and hemiparesis following cerebral infarction affecting right dominant side: Secondary | ICD-10-CM | POA: Diagnosis not present

## 2018-05-19 DIAGNOSIS — G4700 Insomnia, unspecified: Secondary | ICD-10-CM | POA: Diagnosis not present

## 2018-05-19 DIAGNOSIS — E1142 Type 2 diabetes mellitus with diabetic polyneuropathy: Secondary | ICD-10-CM | POA: Diagnosis not present

## 2018-05-29 DIAGNOSIS — N184 Chronic kidney disease, stage 4 (severe): Secondary | ICD-10-CM | POA: Diagnosis not present

## 2018-05-29 DIAGNOSIS — I129 Hypertensive chronic kidney disease with stage 1 through stage 4 chronic kidney disease, or unspecified chronic kidney disease: Secondary | ICD-10-CM | POA: Diagnosis not present

## 2018-05-29 DIAGNOSIS — N2581 Secondary hyperparathyroidism of renal origin: Secondary | ICD-10-CM | POA: Diagnosis not present

## 2018-05-29 DIAGNOSIS — D631 Anemia in chronic kidney disease: Secondary | ICD-10-CM | POA: Diagnosis not present

## 2018-05-29 DIAGNOSIS — M109 Gout, unspecified: Secondary | ICD-10-CM | POA: Diagnosis not present

## 2018-05-29 DIAGNOSIS — E1122 Type 2 diabetes mellitus with diabetic chronic kidney disease: Secondary | ICD-10-CM | POA: Diagnosis not present

## 2018-05-31 DIAGNOSIS — E1122 Type 2 diabetes mellitus with diabetic chronic kidney disease: Secondary | ICD-10-CM | POA: Diagnosis not present

## 2018-05-31 DIAGNOSIS — N2581 Secondary hyperparathyroidism of renal origin: Secondary | ICD-10-CM | POA: Diagnosis not present

## 2018-05-31 DIAGNOSIS — N184 Chronic kidney disease, stage 4 (severe): Secondary | ICD-10-CM | POA: Diagnosis not present

## 2018-05-31 DIAGNOSIS — I129 Hypertensive chronic kidney disease with stage 1 through stage 4 chronic kidney disease, or unspecified chronic kidney disease: Secondary | ICD-10-CM | POA: Diagnosis not present

## 2018-05-31 DIAGNOSIS — R809 Proteinuria, unspecified: Secondary | ICD-10-CM | POA: Diagnosis not present

## 2018-06-02 DIAGNOSIS — R69 Illness, unspecified: Secondary | ICD-10-CM | POA: Diagnosis not present

## 2018-06-07 DIAGNOSIS — Z794 Long term (current) use of insulin: Secondary | ICD-10-CM | POA: Diagnosis not present

## 2018-06-07 DIAGNOSIS — E1159 Type 2 diabetes mellitus with other circulatory complications: Secondary | ICD-10-CM | POA: Diagnosis not present

## 2018-06-07 DIAGNOSIS — E1169 Type 2 diabetes mellitus with other specified complication: Secondary | ICD-10-CM | POA: Diagnosis not present

## 2018-06-07 DIAGNOSIS — I10 Essential (primary) hypertension: Secondary | ICD-10-CM | POA: Diagnosis not present

## 2018-06-07 DIAGNOSIS — N184 Chronic kidney disease, stage 4 (severe): Secondary | ICD-10-CM | POA: Diagnosis not present

## 2018-06-07 DIAGNOSIS — E785 Hyperlipidemia, unspecified: Secondary | ICD-10-CM | POA: Diagnosis not present

## 2018-06-07 DIAGNOSIS — E1122 Type 2 diabetes mellitus with diabetic chronic kidney disease: Secondary | ICD-10-CM | POA: Diagnosis not present

## 2018-06-09 DIAGNOSIS — I10 Essential (primary) hypertension: Secondary | ICD-10-CM | POA: Diagnosis not present

## 2018-06-09 DIAGNOSIS — N184 Chronic kidney disease, stage 4 (severe): Secondary | ICD-10-CM | POA: Diagnosis not present

## 2018-06-09 DIAGNOSIS — R69 Illness, unspecified: Secondary | ICD-10-CM | POA: Diagnosis not present

## 2018-06-09 DIAGNOSIS — D631 Anemia in chronic kidney disease: Secondary | ICD-10-CM | POA: Diagnosis not present

## 2018-06-09 DIAGNOSIS — R001 Bradycardia, unspecified: Secondary | ICD-10-CM | POA: Diagnosis not present

## 2018-06-09 DIAGNOSIS — E1159 Type 2 diabetes mellitus with other circulatory complications: Secondary | ICD-10-CM | POA: Diagnosis not present

## 2018-06-09 DIAGNOSIS — E785 Hyperlipidemia, unspecified: Secondary | ICD-10-CM | POA: Diagnosis not present

## 2018-06-09 DIAGNOSIS — E1169 Type 2 diabetes mellitus with other specified complication: Secondary | ICD-10-CM | POA: Diagnosis not present

## 2018-06-19 DIAGNOSIS — K219 Gastro-esophageal reflux disease without esophagitis: Secondary | ICD-10-CM | POA: Diagnosis not present

## 2018-06-19 DIAGNOSIS — J4540 Moderate persistent asthma, uncomplicated: Secondary | ICD-10-CM | POA: Diagnosis not present

## 2018-06-19 DIAGNOSIS — R0602 Shortness of breath: Secondary | ICD-10-CM | POA: Diagnosis not present

## 2018-06-19 DIAGNOSIS — Z6837 Body mass index (BMI) 37.0-37.9, adult: Secondary | ICD-10-CM | POA: Diagnosis not present

## 2018-06-19 DIAGNOSIS — I10 Essential (primary) hypertension: Secondary | ICD-10-CM | POA: Diagnosis not present

## 2018-06-19 DIAGNOSIS — E1159 Type 2 diabetes mellitus with other circulatory complications: Secondary | ICD-10-CM | POA: Diagnosis not present

## 2018-06-19 DIAGNOSIS — J209 Acute bronchitis, unspecified: Secondary | ICD-10-CM | POA: Diagnosis not present

## 2018-06-28 DIAGNOSIS — R69 Illness, unspecified: Secondary | ICD-10-CM | POA: Diagnosis not present

## 2018-06-29 DIAGNOSIS — E0590 Thyrotoxicosis, unspecified without thyrotoxic crisis or storm: Secondary | ICD-10-CM | POA: Diagnosis not present

## 2018-07-18 DIAGNOSIS — E1122 Type 2 diabetes mellitus with diabetic chronic kidney disease: Secondary | ICD-10-CM | POA: Diagnosis not present

## 2018-07-18 DIAGNOSIS — D649 Anemia, unspecified: Secondary | ICD-10-CM | POA: Diagnosis not present

## 2018-07-18 DIAGNOSIS — E785 Hyperlipidemia, unspecified: Secondary | ICD-10-CM | POA: Diagnosis not present

## 2018-07-18 DIAGNOSIS — N184 Chronic kidney disease, stage 4 (severe): Secondary | ICD-10-CM | POA: Diagnosis not present

## 2018-07-18 DIAGNOSIS — D631 Anemia in chronic kidney disease: Secondary | ICD-10-CM | POA: Diagnosis not present

## 2018-07-18 DIAGNOSIS — Z794 Long term (current) use of insulin: Secondary | ICD-10-CM | POA: Diagnosis not present

## 2018-07-18 DIAGNOSIS — E0590 Thyrotoxicosis, unspecified without thyrotoxic crisis or storm: Secondary | ICD-10-CM | POA: Diagnosis not present

## 2018-07-18 DIAGNOSIS — E1169 Type 2 diabetes mellitus with other specified complication: Secondary | ICD-10-CM | POA: Diagnosis not present

## 2018-07-20 DIAGNOSIS — N184 Chronic kidney disease, stage 4 (severe): Secondary | ICD-10-CM | POA: Diagnosis not present

## 2018-07-20 DIAGNOSIS — Z794 Long term (current) use of insulin: Secondary | ICD-10-CM | POA: Diagnosis not present

## 2018-07-20 DIAGNOSIS — E1122 Type 2 diabetes mellitus with diabetic chronic kidney disease: Secondary | ICD-10-CM | POA: Diagnosis not present

## 2018-07-20 DIAGNOSIS — R0600 Dyspnea, unspecified: Secondary | ICD-10-CM | POA: Diagnosis not present

## 2018-07-20 DIAGNOSIS — R7989 Other specified abnormal findings of blood chemistry: Secondary | ICD-10-CM | POA: Diagnosis not present

## 2018-07-20 DIAGNOSIS — R600 Localized edema: Secondary | ICD-10-CM | POA: Diagnosis not present

## 2018-07-20 DIAGNOSIS — D631 Anemia in chronic kidney disease: Secondary | ICD-10-CM | POA: Diagnosis not present

## 2018-07-28 DIAGNOSIS — R69 Illness, unspecified: Secondary | ICD-10-CM | POA: Diagnosis not present

## 2018-08-08 DIAGNOSIS — E872 Acidosis: Secondary | ICD-10-CM | POA: Diagnosis not present

## 2018-08-08 DIAGNOSIS — E1122 Type 2 diabetes mellitus with diabetic chronic kidney disease: Secondary | ICD-10-CM | POA: Diagnosis not present

## 2018-08-08 DIAGNOSIS — I129 Hypertensive chronic kidney disease with stage 1 through stage 4 chronic kidney disease, or unspecified chronic kidney disease: Secondary | ICD-10-CM | POA: Diagnosis not present

## 2018-08-08 DIAGNOSIS — N2581 Secondary hyperparathyroidism of renal origin: Secondary | ICD-10-CM | POA: Diagnosis not present

## 2018-08-08 DIAGNOSIS — N184 Chronic kidney disease, stage 4 (severe): Secondary | ICD-10-CM | POA: Diagnosis not present

## 2018-08-10 ENCOUNTER — Other Ambulatory Visit: Payer: Self-pay | Admitting: Internal Medicine

## 2018-08-10 ENCOUNTER — Other Ambulatory Visit: Payer: Self-pay

## 2018-08-10 ENCOUNTER — Ambulatory Visit
Admission: RE | Admit: 2018-08-10 | Discharge: 2018-08-10 | Disposition: A | Discharge status: Home / Self Care | Payer: Medicare HMO | Source: Ambulatory Visit | Attending: Internal Medicine | Admitting: Internal Medicine

## 2018-08-10 DIAGNOSIS — R079 Chest pain, unspecified: Secondary | ICD-10-CM | POA: Diagnosis not present

## 2018-08-10 DIAGNOSIS — N184 Chronic kidney disease, stage 4 (severe): Secondary | ICD-10-CM | POA: Diagnosis not present

## 2018-08-10 DIAGNOSIS — R0781 Pleurodynia: Secondary | ICD-10-CM | POA: Diagnosis not present

## 2018-08-10 DIAGNOSIS — K219 Gastro-esophageal reflux disease without esophagitis: Secondary | ICD-10-CM | POA: Diagnosis not present

## 2018-08-10 DIAGNOSIS — Z8711 Personal history of peptic ulcer disease: Secondary | ICD-10-CM | POA: Diagnosis not present

## 2018-08-10 DIAGNOSIS — R0602 Shortness of breath: Secondary | ICD-10-CM | POA: Diagnosis not present

## 2018-08-10 DIAGNOSIS — D631 Anemia in chronic kidney disease: Secondary | ICD-10-CM | POA: Diagnosis not present

## 2018-08-11 ENCOUNTER — Other Ambulatory Visit: Payer: Self-pay | Admitting: Internal Medicine

## 2018-08-11 ENCOUNTER — Other Ambulatory Visit: Payer: Self-pay

## 2018-08-11 DIAGNOSIS — Z20822 Contact with and (suspected) exposure to covid-19: Secondary | ICD-10-CM

## 2018-08-11 DIAGNOSIS — R911 Solitary pulmonary nodule: Secondary | ICD-10-CM

## 2018-08-11 DIAGNOSIS — R6889 Other general symptoms and signs: Secondary | ICD-10-CM | POA: Diagnosis not present

## 2018-08-14 LAB — NOVEL CORONAVIRUS, NAA: SARS-CoV-2, NAA: NOT DETECTED

## 2018-08-24 DIAGNOSIS — R69 Illness, unspecified: Secondary | ICD-10-CM | POA: Diagnosis not present

## 2018-09-19 DIAGNOSIS — Z8371 Family history of colonic polyps: Secondary | ICD-10-CM | POA: Diagnosis not present

## 2018-09-19 DIAGNOSIS — Z86010 Personal history of colonic polyps: Secondary | ICD-10-CM | POA: Diagnosis not present

## 2018-09-19 DIAGNOSIS — K219 Gastro-esophageal reflux disease without esophagitis: Secondary | ICD-10-CM | POA: Diagnosis not present

## 2018-09-19 DIAGNOSIS — R110 Nausea: Secondary | ICD-10-CM | POA: Diagnosis not present

## 2018-09-19 DIAGNOSIS — K5909 Other constipation: Secondary | ICD-10-CM | POA: Diagnosis not present

## 2018-09-19 DIAGNOSIS — K2970 Gastritis, unspecified, without bleeding: Secondary | ICD-10-CM | POA: Diagnosis not present

## 2018-09-20 DIAGNOSIS — Z961 Presence of intraocular lens: Secondary | ICD-10-CM | POA: Diagnosis not present

## 2018-09-20 DIAGNOSIS — H353131 Nonexudative age-related macular degeneration, bilateral, early dry stage: Secondary | ICD-10-CM | POA: Diagnosis not present

## 2018-09-20 DIAGNOSIS — H40013 Open angle with borderline findings, low risk, bilateral: Secondary | ICD-10-CM | POA: Diagnosis not present

## 2018-09-20 DIAGNOSIS — E119 Type 2 diabetes mellitus without complications: Secondary | ICD-10-CM | POA: Diagnosis not present

## 2018-09-20 DIAGNOSIS — H524 Presbyopia: Secondary | ICD-10-CM | POA: Diagnosis not present

## 2018-09-28 DIAGNOSIS — R609 Edema, unspecified: Secondary | ICD-10-CM | POA: Diagnosis not present

## 2018-09-28 DIAGNOSIS — E1159 Type 2 diabetes mellitus with other circulatory complications: Secondary | ICD-10-CM | POA: Diagnosis not present

## 2018-09-28 DIAGNOSIS — I2510 Atherosclerotic heart disease of native coronary artery without angina pectoris: Secondary | ICD-10-CM | POA: Diagnosis not present

## 2018-09-28 DIAGNOSIS — I700 Atherosclerosis of aorta: Secondary | ICD-10-CM | POA: Diagnosis not present

## 2018-09-28 DIAGNOSIS — R0602 Shortness of breath: Secondary | ICD-10-CM | POA: Diagnosis not present

## 2018-09-28 DIAGNOSIS — E1169 Type 2 diabetes mellitus with other specified complication: Secondary | ICD-10-CM | POA: Diagnosis not present

## 2018-09-28 DIAGNOSIS — I10 Essential (primary) hypertension: Secondary | ICD-10-CM | POA: Diagnosis not present

## 2018-09-28 DIAGNOSIS — E785 Hyperlipidemia, unspecified: Secondary | ICD-10-CM | POA: Diagnosis not present

## 2018-10-02 DIAGNOSIS — R69 Illness, unspecified: Secondary | ICD-10-CM | POA: Diagnosis not present

## 2018-10-05 ENCOUNTER — Encounter: Payer: Self-pay | Admitting: Internal Medicine

## 2018-10-05 ENCOUNTER — Ambulatory Visit (INDEPENDENT_AMBULATORY_CARE_PROVIDER_SITE_OTHER): Payer: Medicare HMO | Admitting: Internal Medicine

## 2018-10-05 ENCOUNTER — Other Ambulatory Visit: Payer: Self-pay

## 2018-10-05 DIAGNOSIS — R0600 Dyspnea, unspecified: Secondary | ICD-10-CM

## 2018-10-05 DIAGNOSIS — R0609 Other forms of dyspnea: Secondary | ICD-10-CM

## 2018-10-05 DIAGNOSIS — J449 Chronic obstructive pulmonary disease, unspecified: Secondary | ICD-10-CM

## 2018-10-05 DIAGNOSIS — R911 Solitary pulmonary nodule: Secondary | ICD-10-CM

## 2018-10-05 DIAGNOSIS — IMO0001 Reserved for inherently not codable concepts without codable children: Secondary | ICD-10-CM

## 2018-10-05 NOTE — Patient Instructions (Signed)
Come to our front desk to pick up a sample of Anoro        Inhale 1 puff, every day. The sample will last for a week. See if it helps your breathing.  Ok to use your albuterol rescue inhaler every 6 hours as needed  Don't forget your flu shot this fall.  Go ahead with the Cardiology stress test that you have scheduled  Please call if we can help

## 2018-10-05 NOTE — Progress Notes (Signed)
HPI female former smoker followed for allergic rhinitis, asthma/COPD, history urticaria, complicated by , DM, GERD, history  CVA 2004.MRI brain 2015. Kidney cancer/ partial nephrectomy 2015  PFT 10/26/2011 mild obstructive airways disease, small airways, insignificant response to bronchodilator. Diffusion moderately reduced. FVC 2.46/92%, FEV1 1.76/94%, FEV1/FVC 0.71, FEF 25/75% 1.13/52%. TLC 86%, DLCO 46%. 6 MWT-99%, 97%, 99%, 297.5 m with complaint of right calf pain but no oxygen limitation V/Q scan 12/29/17- No appreciable ventilation or perfusion defects. Very low probability of pulmonary embolus. CXR 12/29/17- No acute disease. US venous ultrasound-12/27/17- 1. No femoropopliteal and no calf DVT in the visualized calf veins. If clinical symptoms are inconsistent or if there are persistent or worsening symptoms, further imaging (possibly involving the iliac veins) may be warranted. 2. Small left Baker's cyst. ECHOcardiogram-12/30/2017-EF 16-10%, grade 1 diastolic dysfunction, restricted mobility aortic valve, mild mitral regurgitation.  No pulmonary hypertension. Lab 12/26/2017-BUN 38, creatinine 1.90, BNP-242 H, D-dimer 0.92 H -----------------------------------------------------------------------------------------------   01/16/2018- 78 year old female former smoker followed for allergic rhinitis, asthma/COPD , history urticaria, complicated by kidney cancer, DM2, GERD, history CVA, HBP   -----Asthma: Pt here to reveiw Echo results in detail.  V/Q scan 12/29/17- No appreciable ventilation or perfusion defects. Very low probability of pulmonary embolus. CXR 12/29/17- No acute disease. US venous ultrasound-12/27/17- 1. No femoropopliteal and no calf DVT in the visualized calf veins. If clinical symptoms are inconsistent or if there are persistent or worsening symptoms, further imaging (possibly involving the iliac veins) may be warranted. 2. Small left Baker's  cyst. ECHOcardiogram-12/30/2017-EF 96-04%, grade 1 diastolic dysfunction, restricted mobility aortic valve, mild mitral regurgitation.  No pulmonary hypertension. Lab 12/26/2017-BUN 38, creatinine 1.90, BNP-242 H, D-dimer 0.92 H Since last here has lost 8 pounds consistent with diuresis, and feeling better. Body weight today 215 pounds,  O2 saturation 94% at rest on room air  10/05/2018- Virtual Visit via Telephone Note  I connected with [name] on 10/05/18 at 10:00 AM EDT by telephone and verified that I am speaking with the correct person using two identifiers.  Location: Patient: H Provider: O   I discussed the limitations, risks, security and privacy concerns of performing an evaluation and management service by telephone and the availability of in person appointments. I also discussed with the patient that there may be a patient responsible charge related to this service. The patient expressed understanding and agreed to proceed.   History of Present Illness: 78 year old female former smoker followed for allergic rhinitis, asthma/COPD , history urticaria, complicated by kidney cancer, DM2, GERD, history CVA, HBP, Diastolic Dysfunction Gr1 Covid test neg 7/24 Albuterol hfa,  Complains of a "grinding" respiratory noise when she lies down. Non-productive cough. DOE with brisk or sustained walking but no wheeze.Frequent nasal congestion. Pains L lateral chest- not exertional. Advair no help. Vicks VapoRup on lip at bedtime makes her comfortable to sleep. Admits her weight affects her breathing.  Pending cardiac stress test.  Discussed CT chest from July, new 4 mm nodule.   Observations/Objective: CT chest 08/10/2018- IMPRESSION: 1. No cause for the patient's symptoms identified. 2. There is a new 4 mm nodule in the left lung on series 3, image 70. Several other stable nodules are noted. No follow-up needed if patient is low-risk. Non-contrast chest CT can be considered in  12 months if patient is high-risk. This recommendation follows the consensus statement: Guidelines for Management of Incidental Pulmonary Nodules Detected on CT Images: From the Fleischner Society 2017; Radiology 2017; 284:228-243. 3. Cardiomegaly. 4. Coronary  artery calcifications. Atherosclerotic changes in the aorta. 5. Emphysematous changes in the lungs. 6. Colonic diverticulosis without diverticulitis.  Aortic Atherosclerosis (ICD10-I70.0) and Emphysema (ICD10-J43.9).   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 Ok to use rescue inhaler regularly 2 x daily for trial. Get flu vax. CAD- pending stress test per cardiology   Follow Up Instructions:    I discussed the assessment and treatment plan with the patient. The patient was provided an opportunity to ask questions and all were answered. The patient agreed with the plan and demonstrated an understanding of the instructions.   The patient was advised to call back or seek an in-person evaluation if the symptoms worsen or if the condition fails to improve as anticipated.  I provided 18 minutes of non-face-to-face time during this encounter.   Baird Lyons, MD    ROS- see HPI   + = positive Constitutional:  + weight loss, night sweats, fevers, chills, fatigue, lassitude. HEENT:   No-  headaches, difficulty swallowing, tooth/dental problems, sore throat,       No-  sneezing, itching, ear ache, +nasal congestion, post nasal drip,  CV:  noncardiac chest pain, no-orthopnea, PND, swelling in lower extremities, anasarca,                                   dizziness, No-palpitations Resp: + shortness of breath with exertion or at rest.                productive cough,  No non-productive cough,  No-  coughing up of blood.               change in color of mucus.  No- wheezing.   Skin: No-   rash or lesions. GI:  No-   heartburn, indigestion, abdominal pain,  nausea, vomiting,  GU: MS:  +  joint pain or swelling.  . Neuro- nothing unusual Psych:  No- change in mood or affect. No depression or anxiety.  No memory loss.  OBJ General- Alert, Oriented, Affect-appropriate, Distress- none acute, Obese, talkative ..  Skin- rash-none, lesions- none, excoriation- none Lymphadenopathy- none Head- atraumatic            Eyes- Gross vision intact, PERRLA, conjunctivae clear secretions            Ears- hearing intact            Nose- +turbinate edema, no-Septal dev, +mucus bridging, no-polyps, erosion, perforation             Throat- Mallampati III , mucosa clear , drainage- none, tonsils- atrophic, + mild hoarseness Neck- flexible , trachea midline, no stridor , thyroid nl, carotid no bruit Chest - symmetrical excursion , unlabored           Heart/CV- RRR , no murmur , no gallop  , no rub, nl s1 s2                           - JVD- none ,  Edema- none, stasis changes- none, varices- none           Lung- clear to P&A, wheeze-none, cough- none , dullness-none, rub- none           Chest wall-  Abd-  Br/ Gen/ Rectal- Not done, not indicated Extrem- cyanosis- none, clubbing, none, atrophy- none, strength-  nl. these Neuro- grossly intact to observation

## 2018-10-07 DIAGNOSIS — IMO0001 Reserved for inherently not codable concepts without codable children: Secondary | ICD-10-CM | POA: Insufficient documentation

## 2018-10-07 DIAGNOSIS — R911 Solitary pulmonary nodule: Secondary | ICD-10-CM | POA: Insufficient documentation

## 2018-10-07 NOTE — Assessment & Plan Note (Signed)
Pending cardiac stress test.

## 2018-10-07 NOTE — Assessment & Plan Note (Signed)
Advair not helpful. She will try sample of Anoro. Ok to use rescue inhaler twice daily if helpful. Reminder flu vax.

## 2018-10-07 NOTE — Assessment & Plan Note (Signed)
Former smoker w hx renal cancer, so she will need ongoing long-term surveillance, but concern is not high for current lesion.

## 2018-10-12 DIAGNOSIS — Z794 Long term (current) use of insulin: Secondary | ICD-10-CM | POA: Diagnosis not present

## 2018-10-12 DIAGNOSIS — E1122 Type 2 diabetes mellitus with diabetic chronic kidney disease: Secondary | ICD-10-CM | POA: Diagnosis not present

## 2018-10-12 DIAGNOSIS — N184 Chronic kidney disease, stage 4 (severe): Secondary | ICD-10-CM | POA: Diagnosis not present

## 2018-10-18 ENCOUNTER — Telehealth: Payer: Self-pay | Admitting: Internal Medicine

## 2018-10-18 MED ORDER — ANORO ELLIPTA 62.5-25 MCG/INH IN AEPB: 1.0000 | INHALATION_SPRAY | Freq: Every day | RESPIRATORY_TRACT | 3 refills | Status: DC

## 2018-10-18 NOTE — Telephone Encounter (Signed)
Noted, thank you. Order for Anoro has been placed. Nothing further needed at this time.

## 2018-10-18 NOTE — Telephone Encounter (Signed)
Call returned to patient, she reports she recently had a tele-visit with CY and he told her to get a sample of Anoro. She reports the nurse made her aware that we did not have Anoro in stock and to try calling back next week. Patient states she later received a later stating that she needed to come by the front desk and get a sample. I made this aware that the letter was actually her AVS and we still currently do not have samples of Anoro. The patient requests that we just sent it in to the pharmacy. I made her aware that CY was trying to avoid having her pay for a medication without knowing it if will be beneficial to her. She states just send it in, if it is too expensive or does not work I will call you back. Anoro sent per pt request.   Will route message to nurse as Juluis Rainier.

## 2018-10-24 DIAGNOSIS — R0602 Shortness of breath: Secondary | ICD-10-CM | POA: Diagnosis not present

## 2018-10-24 DIAGNOSIS — I2510 Atherosclerotic heart disease of native coronary artery without angina pectoris: Secondary | ICD-10-CM | POA: Diagnosis not present

## 2018-10-26 DIAGNOSIS — E1122 Type 2 diabetes mellitus with diabetic chronic kidney disease: Secondary | ICD-10-CM | POA: Diagnosis not present

## 2018-10-26 DIAGNOSIS — N2581 Secondary hyperparathyroidism of renal origin: Secondary | ICD-10-CM | POA: Diagnosis not present

## 2018-10-26 DIAGNOSIS — R8290 Unspecified abnormal findings in urine: Secondary | ICD-10-CM | POA: Diagnosis not present

## 2018-10-26 DIAGNOSIS — N184 Chronic kidney disease, stage 4 (severe): Secondary | ICD-10-CM | POA: Diagnosis not present

## 2018-10-26 DIAGNOSIS — M109 Gout, unspecified: Secondary | ICD-10-CM | POA: Diagnosis not present

## 2018-10-26 DIAGNOSIS — D631 Anemia in chronic kidney disease: Secondary | ICD-10-CM | POA: Diagnosis not present

## 2018-10-26 DIAGNOSIS — I129 Hypertensive chronic kidney disease with stage 1 through stage 4 chronic kidney disease, or unspecified chronic kidney disease: Secondary | ICD-10-CM | POA: Diagnosis not present

## 2018-10-27 DIAGNOSIS — I10 Essential (primary) hypertension: Secondary | ICD-10-CM | POA: Diagnosis not present

## 2018-10-27 DIAGNOSIS — E1159 Type 2 diabetes mellitus with other circulatory complications: Secondary | ICD-10-CM | POA: Diagnosis not present

## 2018-10-30 DIAGNOSIS — E875 Hyperkalemia: Secondary | ICD-10-CM | POA: Diagnosis not present

## 2018-10-30 DIAGNOSIS — I129 Hypertensive chronic kidney disease with stage 1 through stage 4 chronic kidney disease, or unspecified chronic kidney disease: Secondary | ICD-10-CM | POA: Diagnosis not present

## 2018-10-30 DIAGNOSIS — E872 Acidosis: Secondary | ICD-10-CM | POA: Diagnosis not present

## 2018-10-30 DIAGNOSIS — N2581 Secondary hyperparathyroidism of renal origin: Secondary | ICD-10-CM | POA: Diagnosis not present

## 2018-10-30 DIAGNOSIS — D631 Anemia in chronic kidney disease: Secondary | ICD-10-CM | POA: Diagnosis not present

## 2018-10-30 DIAGNOSIS — E1122 Type 2 diabetes mellitus with diabetic chronic kidney disease: Secondary | ICD-10-CM | POA: Diagnosis not present

## 2018-10-30 DIAGNOSIS — N184 Chronic kidney disease, stage 4 (severe): Secondary | ICD-10-CM | POA: Diagnosis not present

## 2018-11-03 DIAGNOSIS — J449 Chronic obstructive pulmonary disease, unspecified: Secondary | ICD-10-CM | POA: Diagnosis not present

## 2018-11-03 DIAGNOSIS — I10 Essential (primary) hypertension: Secondary | ICD-10-CM | POA: Diagnosis not present

## 2018-11-03 DIAGNOSIS — R69 Illness, unspecified: Secondary | ICD-10-CM | POA: Diagnosis not present

## 2018-11-03 DIAGNOSIS — I129 Hypertensive chronic kidney disease with stage 1 through stage 4 chronic kidney disease, or unspecified chronic kidney disease: Secondary | ICD-10-CM | POA: Diagnosis not present

## 2018-11-03 DIAGNOSIS — E785 Hyperlipidemia, unspecified: Secondary | ICD-10-CM | POA: Diagnosis not present

## 2018-11-03 DIAGNOSIS — N185 Chronic kidney disease, stage 5: Secondary | ICD-10-CM | POA: Diagnosis not present

## 2018-11-03 DIAGNOSIS — I700 Atherosclerosis of aorta: Secondary | ICD-10-CM | POA: Diagnosis not present

## 2018-11-03 DIAGNOSIS — E1159 Type 2 diabetes mellitus with other circulatory complications: Secondary | ICD-10-CM | POA: Diagnosis not present

## 2018-11-03 DIAGNOSIS — Z0000 Encounter for general adult medical examination without abnormal findings: Secondary | ICD-10-CM | POA: Diagnosis not present

## 2018-11-03 DIAGNOSIS — I2510 Atherosclerotic heart disease of native coronary artery without angina pectoris: Secondary | ICD-10-CM | POA: Diagnosis not present

## 2018-11-03 DIAGNOSIS — N179 Acute kidney failure, unspecified: Secondary | ICD-10-CM | POA: Diagnosis not present

## 2018-11-03 DIAGNOSIS — J4540 Moderate persistent asthma, uncomplicated: Secondary | ICD-10-CM | POA: Diagnosis not present

## 2018-11-03 DIAGNOSIS — E1122 Type 2 diabetes mellitus with diabetic chronic kidney disease: Secondary | ICD-10-CM | POA: Diagnosis not present

## 2018-11-03 DIAGNOSIS — N184 Chronic kidney disease, stage 4 (severe): Secondary | ICD-10-CM | POA: Diagnosis not present

## 2018-11-03 DIAGNOSIS — Z794 Long term (current) use of insulin: Secondary | ICD-10-CM | POA: Diagnosis not present

## 2018-11-03 DIAGNOSIS — K219 Gastro-esophageal reflux disease without esophagitis: Secondary | ICD-10-CM | POA: Diagnosis not present

## 2018-11-09 DIAGNOSIS — R69 Illness, unspecified: Secondary | ICD-10-CM | POA: Diagnosis not present

## 2018-12-01 DIAGNOSIS — R69 Illness, unspecified: Secondary | ICD-10-CM | POA: Diagnosis not present

## 2018-12-04 DIAGNOSIS — I129 Hypertensive chronic kidney disease with stage 1 through stage 4 chronic kidney disease, or unspecified chronic kidney disease: Secondary | ICD-10-CM | POA: Diagnosis not present

## 2018-12-04 DIAGNOSIS — N2581 Secondary hyperparathyroidism of renal origin: Secondary | ICD-10-CM | POA: Diagnosis not present

## 2018-12-04 DIAGNOSIS — D631 Anemia in chronic kidney disease: Secondary | ICD-10-CM | POA: Diagnosis not present

## 2018-12-04 DIAGNOSIS — E872 Acidosis: Secondary | ICD-10-CM | POA: Diagnosis not present

## 2018-12-04 DIAGNOSIS — E875 Hyperkalemia: Secondary | ICD-10-CM | POA: Diagnosis not present

## 2018-12-04 DIAGNOSIS — E1122 Type 2 diabetes mellitus with diabetic chronic kidney disease: Secondary | ICD-10-CM | POA: Diagnosis not present

## 2018-12-04 DIAGNOSIS — N184 Chronic kidney disease, stage 4 (severe): Secondary | ICD-10-CM | POA: Diagnosis not present

## 2018-12-06 ENCOUNTER — Telehealth: Payer: Self-pay

## 2018-12-06 ENCOUNTER — Telehealth: Payer: Self-pay | Admitting: Gastroenterology

## 2018-12-06 DIAGNOSIS — E875 Hyperkalemia: Secondary | ICD-10-CM | POA: Diagnosis not present

## 2018-12-06 DIAGNOSIS — E872 Acidosis: Secondary | ICD-10-CM | POA: Diagnosis not present

## 2018-12-06 DIAGNOSIS — D631 Anemia in chronic kidney disease: Secondary | ICD-10-CM | POA: Diagnosis not present

## 2018-12-06 DIAGNOSIS — I129 Hypertensive chronic kidney disease with stage 1 through stage 4 chronic kidney disease, or unspecified chronic kidney disease: Secondary | ICD-10-CM | POA: Diagnosis not present

## 2018-12-06 DIAGNOSIS — N2581 Secondary hyperparathyroidism of renal origin: Secondary | ICD-10-CM | POA: Diagnosis not present

## 2018-12-06 DIAGNOSIS — N184 Chronic kidney disease, stage 4 (severe): Secondary | ICD-10-CM | POA: Diagnosis not present

## 2018-12-06 DIAGNOSIS — E1122 Type 2 diabetes mellitus with diabetic chronic kidney disease: Secondary | ICD-10-CM | POA: Diagnosis not present

## 2018-12-06 NOTE — Telephone Encounter (Signed)
Patient son is calling because he wants to scheduled patient for  A colonoscopy. Please call sone back at [phone]

## 2018-12-06 NOTE — Telephone Encounter (Signed)
ERROR

## 2018-12-07 ENCOUNTER — Ambulatory Visit: Payer: Medicare HMO | Admitting: Gastroenterology

## 2018-12-07 ENCOUNTER — Telehealth: Payer: Self-pay | Admitting: Gastroenterology

## 2018-12-07 ENCOUNTER — Encounter: Payer: Self-pay | Admitting: Gastroenterology

## 2018-12-07 NOTE — Telephone Encounter (Signed)
Patient was scheduled to be seen today, 12/07/18 with Dr Vicente Males.  When Dr Vicente Males reviewed her chart for today's visit he noticed the patient has been seeing Dr Alice Reichert with [facility].  Due to continuity of care, decision was made to cancel and advised patient to return to [facility].  Patient returned call to find out why her appointment had been canceled.  Was explained by his nurse Rushie Chestnut the information above.  Patient because very agitated and threatening, her daughter is an attorney and asked if we were refusing to give her care.  She was told that we would look into the situation and get back in touch with her.  At that point I contacted the office administrator at [facility] and explained the situation to her and asked her to look into it.  Suanne Marker contacted the patient and the patient's daughter and they are going to make arrangements to have her seen sooner in their office.  She said there was no need for our office to contact her since she spoke with the patient and have taken care of the issue.

## 2018-12-18 DIAGNOSIS — D126 Benign neoplasm of colon, unspecified: Secondary | ICD-10-CM | POA: Diagnosis not present

## 2018-12-18 DIAGNOSIS — Z23 Encounter for immunization: Secondary | ICD-10-CM | POA: Diagnosis not present

## 2018-12-18 DIAGNOSIS — E669 Obesity, unspecified: Secondary | ICD-10-CM | POA: Diagnosis not present

## 2018-12-18 DIAGNOSIS — I2510 Atherosclerotic heart disease of native coronary artery without angina pectoris: Secondary | ICD-10-CM | POA: Diagnosis not present

## 2018-12-18 DIAGNOSIS — K219 Gastro-esophageal reflux disease without esophagitis: Secondary | ICD-10-CM | POA: Diagnosis not present

## 2018-12-18 DIAGNOSIS — K5909 Other constipation: Secondary | ICD-10-CM | POA: Diagnosis not present

## 2019-01-16 DIAGNOSIS — N184 Chronic kidney disease, stage 4 (severe): Secondary | ICD-10-CM | POA: Diagnosis not present

## 2019-01-16 DIAGNOSIS — E872 Acidosis: Secondary | ICD-10-CM | POA: Diagnosis not present

## 2019-01-16 DIAGNOSIS — E875 Hyperkalemia: Secondary | ICD-10-CM | POA: Diagnosis not present

## 2019-01-16 DIAGNOSIS — I129 Hypertensive chronic kidney disease with stage 1 through stage 4 chronic kidney disease, or unspecified chronic kidney disease: Secondary | ICD-10-CM | POA: Diagnosis not present

## 2019-01-16 DIAGNOSIS — N2581 Secondary hyperparathyroidism of renal origin: Secondary | ICD-10-CM | POA: Diagnosis not present

## 2019-01-16 DIAGNOSIS — D631 Anemia in chronic kidney disease: Secondary | ICD-10-CM | POA: Diagnosis not present

## 2019-01-16 DIAGNOSIS — E1122 Type 2 diabetes mellitus with diabetic chronic kidney disease: Secondary | ICD-10-CM | POA: Diagnosis not present

## 2019-01-17 DIAGNOSIS — R69 Illness, unspecified: Secondary | ICD-10-CM | POA: Diagnosis not present

## 2019-01-28 ENCOUNTER — Other Ambulatory Visit: Payer: Self-pay

## 2019-01-28 ENCOUNTER — Inpatient Hospital Stay: Payer: Medicare HMO

## 2019-01-28 ENCOUNTER — Inpatient Hospital Stay
Admission: EM | Admit: 2019-01-28 | Discharge: 2019-02-01 | DRG: 981 | Disposition: A | Discharge status: Home / Self Care | Payer: Medicare HMO | Attending: Family Medicine | Admitting: Family Medicine

## 2019-01-28 DIAGNOSIS — K660 Peritoneal adhesions (postprocedural) (postinfection): Secondary | ICD-10-CM | POA: Diagnosis not present

## 2019-01-28 DIAGNOSIS — Z8249 Family history of ischemic heart disease and other diseases of the circulatory system: Secondary | ICD-10-CM

## 2019-01-28 DIAGNOSIS — R001 Bradycardia, unspecified: Secondary | ICD-10-CM | POA: Diagnosis not present

## 2019-01-28 DIAGNOSIS — R05 Cough: Secondary | ICD-10-CM | POA: Diagnosis not present

## 2019-01-28 DIAGNOSIS — Z86010 Personal history of colonic polyps: Secondary | ICD-10-CM | POA: Diagnosis not present

## 2019-01-28 DIAGNOSIS — N2581 Secondary hyperparathyroidism of renal origin: Secondary | ICD-10-CM | POA: Diagnosis not present

## 2019-01-28 DIAGNOSIS — N184 Chronic kidney disease, stage 4 (severe): Secondary | ICD-10-CM | POA: Diagnosis not present

## 2019-01-28 DIAGNOSIS — N179 Acute kidney failure, unspecified: Secondary | ICD-10-CM

## 2019-01-28 DIAGNOSIS — N189 Chronic kidney disease, unspecified: Secondary | ICD-10-CM

## 2019-01-28 DIAGNOSIS — I120 Hypertensive chronic kidney disease with stage 5 chronic kidney disease or end stage renal disease: Secondary | ICD-10-CM | POA: Diagnosis not present

## 2019-01-28 DIAGNOSIS — J449 Chronic obstructive pulmonary disease, unspecified: Secondary | ICD-10-CM | POA: Diagnosis present

## 2019-01-28 DIAGNOSIS — R059 Cough, unspecified: Secondary | ICD-10-CM

## 2019-01-28 DIAGNOSIS — K429 Umbilical hernia without obstruction or gangrene: Secondary | ICD-10-CM | POA: Diagnosis present

## 2019-01-28 DIAGNOSIS — E11649 Type 2 diabetes mellitus with hypoglycemia without coma: Secondary | ICD-10-CM | POA: Diagnosis present

## 2019-01-28 DIAGNOSIS — R069 Unspecified abnormalities of breathing: Secondary | ICD-10-CM | POA: Diagnosis not present

## 2019-01-28 DIAGNOSIS — E785 Hyperlipidemia, unspecified: Secondary | ICD-10-CM | POA: Diagnosis present

## 2019-01-28 DIAGNOSIS — Z79899 Other long term (current) drug therapy: Secondary | ICD-10-CM

## 2019-01-28 DIAGNOSIS — D631 Anemia in chronic kidney disease: Secondary | ICD-10-CM | POA: Diagnosis present

## 2019-01-28 DIAGNOSIS — Z794 Long term (current) use of insulin: Secondary | ICD-10-CM

## 2019-01-28 DIAGNOSIS — R319 Hematuria, unspecified: Secondary | ICD-10-CM | POA: Diagnosis present

## 2019-01-28 DIAGNOSIS — R1013 Epigastric pain: Secondary | ICD-10-CM

## 2019-01-28 DIAGNOSIS — N186 End stage renal disease: Secondary | ICD-10-CM | POA: Diagnosis not present

## 2019-01-28 DIAGNOSIS — K219 Gastro-esophageal reflux disease without esophagitis: Secondary | ICD-10-CM | POA: Diagnosis present

## 2019-01-28 DIAGNOSIS — E875 Hyperkalemia: Secondary | ICD-10-CM | POA: Diagnosis not present

## 2019-01-28 DIAGNOSIS — Z85528 Personal history of other malignant neoplasm of kidney: Secondary | ICD-10-CM

## 2019-01-28 DIAGNOSIS — M1A9XX0 Chronic gout, unspecified, without tophus (tophi): Secondary | ICD-10-CM | POA: Diagnosis present

## 2019-01-28 DIAGNOSIS — Z833 Family history of diabetes mellitus: Secondary | ICD-10-CM | POA: Diagnosis not present

## 2019-01-28 DIAGNOSIS — I10 Essential (primary) hypertension: Secondary | ICD-10-CM | POA: Diagnosis not present

## 2019-01-28 DIAGNOSIS — E1122 Type 2 diabetes mellitus with diabetic chronic kidney disease: Secondary | ICD-10-CM | POA: Diagnosis not present

## 2019-01-28 DIAGNOSIS — R0602 Shortness of breath: Secondary | ICD-10-CM | POA: Diagnosis not present

## 2019-01-28 DIAGNOSIS — K760 Fatty (change of) liver, not elsewhere classified: Secondary | ICD-10-CM | POA: Diagnosis not present

## 2019-01-28 DIAGNOSIS — E872 Acidosis: Secondary | ICD-10-CM | POA: Diagnosis not present

## 2019-01-28 DIAGNOSIS — Z8673 Personal history of transient ischemic attack (TIA), and cerebral infarction without residual deficits: Secondary | ICD-10-CM

## 2019-01-28 DIAGNOSIS — I129 Hypertensive chronic kidney disease with stage 1 through stage 4 chronic kidney disease, or unspecified chronic kidney disease: Secondary | ICD-10-CM | POA: Diagnosis not present

## 2019-01-28 DIAGNOSIS — R809 Proteinuria, unspecified: Secondary | ICD-10-CM | POA: Diagnosis present

## 2019-01-28 DIAGNOSIS — R519 Headache, unspecified: Secondary | ICD-10-CM | POA: Diagnosis present

## 2019-01-28 DIAGNOSIS — Z20822 Contact with and (suspected) exposure to covid-19: Secondary | ICD-10-CM | POA: Diagnosis not present

## 2019-01-28 DIAGNOSIS — R531 Weakness: Secondary | ICD-10-CM

## 2019-01-28 DIAGNOSIS — Z905 Acquired absence of kidney: Secondary | ICD-10-CM

## 2019-01-28 DIAGNOSIS — Z7902 Long term (current) use of antithrombotics/antiplatelets: Secondary | ICD-10-CM

## 2019-01-28 DIAGNOSIS — Z91041 Radiographic dye allergy status: Secondary | ICD-10-CM

## 2019-01-28 DIAGNOSIS — Z90710 Acquired absence of both cervix and uterus: Secondary | ICD-10-CM

## 2019-01-28 DIAGNOSIS — N185 Chronic kidney disease, stage 5: Secondary | ICD-10-CM | POA: Diagnosis not present

## 2019-01-28 DIAGNOSIS — Z87891 Personal history of nicotine dependence: Secondary | ICD-10-CM

## 2019-01-28 DIAGNOSIS — Z882 Allergy status to sulfonamides status: Secondary | ICD-10-CM

## 2019-01-28 DIAGNOSIS — Z888 Allergy status to other drugs, medicaments and biological substances status: Secondary | ICD-10-CM

## 2019-01-28 HISTORY — DX: Malignant (primary) neoplasm, unspecified: C80.1

## 2019-01-28 LAB — BRAIN NATRIURETIC PEPTIDE: B Natriuretic Peptide: 53 pg/mL (ref 0.0–100.0)

## 2019-01-28 LAB — BASIC METABOLIC PANEL
Anion gap: 11 (ref 5–15)
Anion gap: 10 (ref 5–15)
BUN: 82 mg/dL — ABNORMAL HIGH (ref 8–23)
BUN: 81 mg/dL — ABNORMAL HIGH (ref 8–23)
CO2: 18 mmol/L — ABNORMAL LOW (ref 22–32)
CO2: 19 mmol/L — ABNORMAL LOW (ref 22–32)
Calcium: 9.4 mg/dL (ref 8.9–10.3)
Calcium: 8.7 mg/dL — ABNORMAL LOW (ref 8.9–10.3)
Chloride: 103 mmol/L (ref 98–111)
Chloride: 106 mmol/L (ref 98–111)
Creatinine, Ser: 4.4 mg/dL — ABNORMAL HIGH (ref 0.44–1.00)
Creatinine, Ser: 4.13 mg/dL — ABNORMAL HIGH (ref 0.44–1.00)
GFR calc Af Amer: 10 mL/min — ABNORMAL LOW (ref >60)
GFR calc Af Amer: 11 mL/min — ABNORMAL LOW (ref >60)
GFR calc non Af Amer: 9 mL/min — ABNORMAL LOW (ref >60)
GFR calc non Af Amer: 10 mL/min — ABNORMAL LOW (ref >60)
Glucose, Bld: 191 mg/dL — ABNORMAL HIGH (ref 70–99)
Glucose, Bld: 271 mg/dL — ABNORMAL HIGH (ref 70–99)
Potassium: 6.6 mmol/L (ref 3.5–5.1)
Potassium: 5.9 mmol/L — ABNORMAL HIGH (ref 3.5–5.1)
Sodium: 132 mmol/L — ABNORMAL LOW (ref 135–145)
Sodium: 135 mmol/L (ref 135–145)

## 2019-01-28 LAB — CK: Total CK: 74 U/L (ref 38–234)

## 2019-01-28 LAB — HEPATIC FUNCTION PANEL
ALT: 14 U/L (ref 0–44)
AST: 20 U/L (ref 15–41)
Albumin: 3.5 g/dL (ref 3.5–5.0)
Alkaline Phosphatase: 64 U/L (ref 38–126)
Bilirubin, Direct: 0.3 mg/dL — ABNORMAL HIGH (ref 0.0–0.2)
Indirect Bilirubin: 0.7 mg/dL (ref 0.3–0.9)
Total Bilirubin: 1 mg/dL (ref 0.3–1.2)
Total Protein: 7.4 g/dL (ref 6.5–8.1)

## 2019-01-28 LAB — TROPONIN I (HIGH SENSITIVITY)
Troponin I (High Sensitivity): 4 ng/L (ref <18)
Troponin I (High Sensitivity): 5 ng/L (ref <18)

## 2019-01-28 LAB — URINALYSIS, COMPLETE (UACMP) WITH MICROSCOPIC
Bacteria, UA: NONE SEEN
Bilirubin Urine: NEGATIVE
Glucose, UA: NEGATIVE mg/dL
Hgb urine dipstick: NEGATIVE
Ketones, ur: NEGATIVE mg/dL
Leukocytes,Ua: NEGATIVE
Nitrite: NEGATIVE
Protein, ur: 100 mg/dL — AB
Specific Gravity, Urine: 1.009 (ref 1.005–1.030)
pH: 5 (ref 5.0–8.0)

## 2019-01-28 LAB — POC SARS CORONAVIRUS 2 AG: SARS Coronavirus 2 Ag: NEGATIVE

## 2019-01-28 LAB — CBC
HCT: 37.6 % (ref 36.0–46.0)
Hemoglobin: 12.1 g/dL (ref 12.0–15.0)
MCH: 29.2 pg (ref 26.0–34.0)
MCHC: 32.2 g/dL (ref 30.0–36.0)
MCV: 90.6 fL (ref 80.0–100.0)
Platelets: 276 10*3/uL (ref 150–400)
RBC: 4.15 MIL/uL (ref 3.87–5.11)
RDW: 14.6 % (ref 11.5–15.5)
WBC: 11.3 10*3/uL — ABNORMAL HIGH (ref 4.0–10.5)
nRBC: 0 % (ref 0.0–0.2)

## 2019-01-28 LAB — RESPIRATORY PANEL BY RT PCR (FLU A&B, COVID)
Influenza A by PCR: NEGATIVE
Influenza B by PCR: NEGATIVE
SARS Coronavirus 2 by RT PCR: NEGATIVE

## 2019-01-28 LAB — LIPASE, BLOOD: Lipase: 44 U/L (ref 11–51)

## 2019-01-28 MED ORDER — SODIUM ZIRCONIUM CYCLOSILICATE 10 G PO PACK
10.0000 g | PACK | Freq: Two times a day (BID) | ORAL | Status: DC
  Administered 2019-01-28 – 2019-02-01 (×8): 10 g via ORAL
  Filled 2019-01-28 (×10): qty 1

## 2019-01-28 MED ORDER — CLOPIDOGREL BISULFATE 75 MG PO TABS
75.0000 mg | ORAL_TABLET | Freq: Every day | ORAL | Status: DC
  Administered 2019-01-28 – 2019-02-01 (×5): 75 mg via ORAL
  Filled 2019-01-28 (×5): qty 1

## 2019-01-28 MED ORDER — CALCIUM GLUCONATE-NACL 1-0.675 GM/50ML-% IV SOLN
1.0000 g | Freq: Once | INTRAVENOUS | Status: AC
  Administered 2019-01-28: 1000 mg via INTRAVENOUS
  Filled 2019-01-28: qty 50

## 2019-01-28 MED ORDER — HEPARIN SODIUM (PORCINE) 5000 UNIT/ML IJ SOLN
5000.0000 [IU] | Freq: Three times a day (TID) | INTRAMUSCULAR | Status: DC
  Administered 2019-01-28 – 2019-01-31 (×4): 5000 [IU] via SUBCUTANEOUS
  Filled 2019-01-28 (×2): qty 1

## 2019-01-28 MED ORDER — SODIUM CHLORIDE 0.9 % IV BOLUS
1000.0000 mL | Freq: Once | INTRAVENOUS | Status: AC
  Administered 2019-01-28: 16:00:00 1000 mL via INTRAVENOUS

## 2019-01-28 MED ORDER — PANTOPRAZOLE SODIUM 40 MG PO TBEC
40.0000 mg | DELAYED_RELEASE_TABLET | Freq: Every day | ORAL | Status: DC
  Administered 2019-01-29 – 2019-02-01 (×4): 40 mg via ORAL
  Filled 2019-01-28 (×4): qty 1

## 2019-01-28 MED ORDER — ATORVASTATIN CALCIUM 20 MG PO TABS
40.0000 mg | ORAL_TABLET | Freq: Every day | ORAL | Status: DC
  Administered 2019-01-28 – 2019-02-01 (×5): 40 mg via ORAL
  Filled 2019-01-28 (×5): qty 2

## 2019-01-28 MED ORDER — SODIUM BICARBONATE 8.4 % IV SOLN
50.0000 meq | Freq: Once | INTRAVENOUS | Status: AC
  Administered 2019-01-28: 17:00:00 50 meq via INTRAVENOUS
  Filled 2019-01-28: qty 50

## 2019-01-28 MED ORDER — ONDANSETRON HCL 4 MG/2ML IJ SOLN
4.0000 mg | Freq: Once | INTRAMUSCULAR | Status: AC
  Administered 2019-01-28: 4 mg via INTRAVENOUS
  Filled 2019-01-28: qty 2

## 2019-01-28 MED ORDER — SODIUM CHLORIDE 0.9 % IV SOLN: INTRAVENOUS | Status: DC

## 2019-01-28 MED ORDER — AMLODIPINE BESYLATE 10 MG PO TABS
10.0000 mg | ORAL_TABLET | Freq: Every day | ORAL | Status: DC
  Administered 2019-01-28 – 2019-02-01 (×5): 10 mg via ORAL
  Filled 2019-01-28 (×3): qty 1
  Filled 2019-01-28: qty 2
  Filled 2019-01-28: qty 1

## 2019-01-28 MED ORDER — ONDANSETRON HCL 4 MG PO TABS: 4.0000 mg | ORAL_TABLET | Freq: Four times a day (QID) | ORAL | Status: DC | PRN

## 2019-01-28 MED ORDER — UMECLIDINIUM-VILANTEROL 62.5-25 MCG/INH IN AEPB
1.0000 | INHALATION_SPRAY | Freq: Every day | RESPIRATORY_TRACT | Status: DC
  Administered 2019-01-28 – 2019-02-01 (×5): 1 via RESPIRATORY_TRACT
  Filled 2019-01-28: qty 14

## 2019-01-28 MED ORDER — FEBUXOSTAT 40 MG PO TABS: 40.0000 mg | ORAL_TABLET | Freq: Every day | ORAL | Status: DC

## 2019-01-28 MED ORDER — ALBUTEROL SULFATE HFA 108 (90 BASE) MCG/ACT IN AERS
2.0000 | INHALATION_SPRAY | Freq: Four times a day (QID) | RESPIRATORY_TRACT | Status: DC | PRN
  Administered 2019-01-30: 2 via RESPIRATORY_TRACT
  Filled 2019-01-28: qty 6.7

## 2019-01-28 MED ORDER — CALCITRIOL 0.25 MCG PO CAPS
0.2500 ug | ORAL_CAPSULE | Freq: Every day | ORAL | Status: DC
  Administered 2019-01-29 – 2019-02-01 (×4): 0.25 ug via ORAL
  Filled 2019-01-28 (×4): qty 1

## 2019-01-28 MED ORDER — ALLOPURINOL 100 MG PO TABS
50.0000 mg | ORAL_TABLET | Freq: Every day | ORAL | Status: DC
  Administered 2019-01-29 – 2019-02-01 (×4): 50 mg via ORAL
  Filled 2019-01-28 (×4): qty 0.5

## 2019-01-28 MED ORDER — ONDANSETRON HCL 4 MG/2ML IJ SOLN: 4.0000 mg | Freq: Four times a day (QID) | INTRAMUSCULAR | Status: DC | PRN

## 2019-01-28 MED ORDER — ZOLPIDEM TARTRATE 5 MG PO TABS: 5.0000 mg | ORAL_TABLET | Freq: Every evening | ORAL | Status: DC | PRN

## 2019-01-28 MED ORDER — SODIUM CHLORIDE 0.9 % IV SOLN: 1.0000 g | Freq: Once | INTRAVENOUS | Status: DC

## 2019-01-28 MED ORDER — ACETAMINOPHEN 325 MG PO TABS: 650.0000 mg | ORAL_TABLET | Freq: Four times a day (QID) | ORAL | Status: DC | PRN

## 2019-01-28 MED ORDER — DEXTROSE 50 % IV SOLN
25.0000 g | Freq: Once | INTRAVENOUS | Status: AC
  Administered 2019-01-28: 25 g via INTRAVENOUS
  Filled 2019-01-28: qty 50

## 2019-01-28 MED ORDER — ACETAMINOPHEN 650 MG RE SUPP: 650.0000 mg | Freq: Four times a day (QID) | RECTAL | Status: DC | PRN

## 2019-01-28 MED ORDER — INSULIN ASPART 100 UNIT/ML ~~LOC~~ SOLN
10.0000 [IU] | Freq: Once | SUBCUTANEOUS | Status: AC
  Administered 2019-01-28: 10 [IU] via INTRAVENOUS
  Filled 2019-01-28: qty 1

## 2019-01-28 NOTE — ED Notes (Signed)
Dr Wieting at bedside 

## 2019-01-28 NOTE — Progress Notes (Signed)
[facility]  ROUNDING NOTE   Subjective:     Objective:  Vital signs in last 24 hours:  Temp:  [97.5 F (36.4 C)] 97.5 F (36.4 C) (01/10 1356) Pulse Rate:  [44-58] 50 (01/10 2230) Resp:  [14-21] 15 (01/10 2230) BP: (132-162)/(49-115) 132/53 (01/10 2230) SpO2:  [96 %-99 %] 99 % (01/10 2230) Weight:  [93.4 kg] 93.4 kg (01/10 1357)  Weight change:  Filed Weights   01/28/19 1357  Weight: 93.4 kg    Intake/Output: I/O last 3 completed shifts: In: 88 [IV Piggyback:50] Out: -    Intake/Output this shift:  Total I/O In: 1000 [IV Piggyback:1000] Out: -   Physical Exam: General: NAD,   Head: Normocephalic, atraumatic. Moist oral mucosal membranes  Eyes: Anicteric, PERRL  Neck: Supple, trachea midline  Lungs:  Clear to auscultation  Heart: bradycardia  Abdomen:  Soft, nontender,   Extremities: trace peripheral edema.  Neurologic: Nonfocal, moving all four extremities  Skin: No lesions  Access: none    Basic Metabolic Panel: Recent Labs  Lab 01/28/19 1401 01/28/19 2003  NA 132* 135  K 6.6* 5.9*  CL 103 106  CO2 18* 19*  GLUCOSE 191* 271*  BUN 82* 81*  CREATININE 4.40* 4.13*  CALCIUM 9.4 8.7*    Liver Function Tests: Recent Labs  Lab 01/28/19 1406  AST 20  ALT 14  ALKPHOS 64  BILITOT 1.0  PROT 7.4  ALBUMIN 3.5   Recent Labs  Lab 01/28/19 1406  LIPASE 44   No results for input(s): AMMONIA in the last 168 hours.  CBC: Recent Labs  Lab 01/28/19 1401  WBC 11.3*  HGB 12.1  HCT 37.6  MCV 90.6  PLT 276    Cardiac Enzymes: Recent Labs  Lab 01/28/19 2003  CKTOTAL 74    BNP: Invalid input(s): POCBNP  CBG: No results for input(s): GLUCAP in the last 168 hours.  Microbiology: Results for orders placed or performed during the hospital encounter of 01/28/19  Respiratory Panel by RT PCR (Flu A&B, Covid) - Nasopharyngeal Swab     Status: None   Collection Time: 01/28/19  4:18 PM   Specimen: Nasopharyngeal Swab  Result  Value Ref Range Status   SARS Coronavirus 2 by RT PCR NEGATIVE NEGATIVE Final    Comment: (NOTE) SARS-CoV-2 target nucleic acids are NOT DETECTED. The SARS-CoV-2 RNA is generally detectable in upper respiratoy specimens during the acute phase of infection. The lowest concentration of SARS-CoV-2 viral copies this assay can detect is 131 copies/mL. A negative result does not preclude SARS-Cov-2 infection and should not be used as the sole basis for treatment or other patient management decisions. A negative result may occur with  improper specimen collection/handling, submission of specimen other than nasopharyngeal swab, presence of viral mutation(s) within the areas targeted by this assay, and inadequate number of viral copies (<131 copies/mL). A negative result must be combined with clinical observations, patient history, and epidemiological information. The expected result is Negative. Fact Sheet for Patients:  PinkCheek.be Fact Sheet for Healthcare Providers:  GravelBags.it This test is not yet ap proved or cleared by the [place] FDA and  has been authorized for detection and/or diagnosis of SARS-CoV-2 by FDA under an Emergency Use Authorization (EUA). This EUA will remain  in effect (meaning this test can be used) for the duration of the COVID-19 declaration under Section 564(b)(1) of the Act, 21 U.S.C. section 360bbb-3(b)(1), unless the authorization is terminated or revoked sooner.    Influenza A by  PCR NEGATIVE NEGATIVE Final   Influenza B by PCR NEGATIVE NEGATIVE Final    Comment: (NOTE) The Xpert Xpress SARS-CoV-2/FLU/RSV assay is intended as an aid in  the diagnosis of influenza from Nasopharyngeal swab specimens and  should not be used as a sole basis for treatment. Nasal washings and  aspirates are unacceptable for Xpert Xpress SARS-CoV-2/FLU/RSV  testing. Fact Sheet for  Patients: PinkCheek.be Fact Sheet for Healthcare Providers: GravelBags.it This test is not yet approved or cleared by the [place] FDA and  has been authorized for detection and/or diagnosis of SARS-CoV-2 by  FDA under an Emergency Use Authorization (EUA). This EUA will remain  in effect (meaning this test can be used) for the duration of the  Covid-19 declaration under Section 564(b)(1) of the Act, 21  U.S.C. section 360bbb-3(b)(1), unless the authorization is  terminated or revoked. Performed at [facility], [address] [place],  [address]     Coagulation Studies: No results for input(s): LABPROT, INR in the last 72 hours.  Urinalysis: Recent Labs    01/28/19 1836  COLORURINE YELLOW*  LABSPEC 1.009  PHURINE 5.0  GLUCOSEU NEGATIVE  HGBUR NEGATIVE  BILIRUBINUR NEGATIVE  KETONESUR NEGATIVE  PROTEINUR 100*  NITRITE NEGATIVE  LEUKOCYTESUR NEGATIVE      Imaging: CT HEAD WO CONTRAST  Result Date: 01/28/2019 CLINICAL DATA:  Headache EXAM: CT HEAD WITHOUT CONTRAST TECHNIQUE: Contiguous axial images were obtained from the base of the skull through the vertex without intravenous contrast. COMPARISON:  CT head dated 11/22/2013. FINDINGS: Brain: No evidence of acute infarction, hemorrhage, hydrocephalus, extra-axial collection or mass lesion/mass effect. There is a chronic infarct of the left corona radiata. Periventricular white matter hypoattenuation likely represents chronic small vessel ischemic disease. Vascular: There are vascular calcifications in the carotid siphons. Skull: Normal. Negative for fracture or focal lesion. Sinuses/Orbits: No acute finding. Other: None. IMPRESSION: 1. No acute intracranial process. Electronically Signed   By: Zerita Boers M.D.   On: 01/28/2019 16:21   DG Chest Port 1 View  Result Date: 01/28/2019 CLINICAL DATA:  Shortness of breath. EXAM: PORTABLE CHEST 1 VIEW  COMPARISON:  December 29, 2017 FINDINGS: The heart size is mildly enlarged but stable. The hila and mediastinum are normal. No pneumothorax. No nodules or masses. No focal infiltrates or overt edema. IMPRESSION: No active disease. Electronically Signed   By: Dorise Bullion III M.D   On: 01/28/2019 16:17     Medications:   . sodium chloride 50 mL/hr at 01/28/19 2007   . [START ON 01/29/2019] allopurinol  50 mg Oral Daily  . amLODipine  10 mg Oral Daily  . atorvastatin  40 mg Oral Daily  . [START ON 01/29/2019] calcitRIOL  0.25 mcg Oral Daily  . clopidogrel  75 mg Oral Daily  . heparin  5,000 Units Subcutaneous Q8H  . [START ON 01/29/2019] pantoprazole  40 mg Oral Daily  . sodium zirconium cyclosilicate  10 g Oral BID  . umeclidinium-vilanterol  1 puff Inhalation Daily   acetaminophen **OR** acetaminophen, albuterol, ondansetron **OR** ondansetron (ZOFRAN) IV, zolpidem  Assessment/ Plan:  Ms. Megan [name] is a 79 y.o. black female with diabetes mellitus type II insulin dependent, hypertension, hyperlipidemia, renal cell carcinoma status post resection, CVA, gout, COPD, and GERD    1. Acute renal failure with hyperkalemia on Chronic Kidney Disease stage IV: with metabolic acidosis, proteinuria and hematuria:  Baseline creatinine of 2.39, GFR of 22 on 11/24/2018 secondary to diabetic nephropathy, hypertensive nephrosclerosis and partial  nephrectomy. Acute renal failure with hyperkalemia could be due to overdiuresis versus progression of disease - Hold lisinopril and spironolactone - Discussed dialysis - started on lokelma  2. Hypertension: with edema. Well controlled 132/53. Home regimen of lisinopril, furosemide, spironolactone, bisoprolol and amldoipine.  Holding furosemide, spironolactone and lisinopril Holding bisoprolol due to bradycardia  3. Diabetes mellitus type II: insulin dependent. with chronic kidney disease. With hypoglycemic events - Discontinued glipizide  recently  4. Secondary Hyperparathyroidism: PTH 201 on 10/26/2018 - calcitriol  5. Gout: no acute flares. - Continue Uloric.   LOS: 0 Megan [name] 1/10/202111:35 PM

## 2019-01-28 NOTE — ED Provider Notes (Addendum)
[facility] Emergency Department Provider Note  Time seen: 2:49 PM  I have reviewed the triage vital signs and the nursing notes.   HISTORY  Chief Complaint Nausea and Shortness of Breath   HPI Megan [name] is a 79 y.o. female with a past medical history of asthma, COPD, diabetes, CKD, hypertension, hyperlipidemia, presents to the emergency department for generalized fatigue and nausea.  According to the patient today she has felt extremely fatigued, has been getting nauseated at times and lightheaded/dizzy especially with standing.  Patient denies any known fever cough or shortness of breath.  Denies any vomiting or diarrhea although does state nausea.  Denies any chest pain or abdominal pain.   Past Medical History:  Diagnosis Date  . Adenomatous colon polyp   . Allergic rhinitis   . Asthma   . COPD (chronic obstructive pulmonary disease) ([place])   . Diabetes mellitus type II   . Diverticulosis of colon   . Esophageal stricture   . Fatty liver   . Gastritis   . GERD (gastroesophageal reflux disease)   . Gout   . History of chickenpox   . History of CVA (cerebrovascular accident) 2004  . Hyperlipidemia   . Hypertension   . Internal hemorrhoids   . Kidney tumor 11/2012  . Loss of smell 2004   due to CVA  . Periumbilical hernia   . Renal insufficiency   . Umbilical hernia     Patient Active Problem List   Diagnosis Date Noted  . Lung nodule < 6cm on CT 10/07/2018  . Dyspnea on exertion 01/16/2018  . Obesity (BMI 30-39.9) 07/04/2017  . Stroke ([place]) 07/04/2017  . Trochanteric bursitis of right hip 02/01/2017  . Type 2 diabetes mellitus with diabetic nephropathy, with long-term current use of insulin ([place]) 07/15/2015  . Otitis, externa, infective 03/26/2014  . Hamstring injury 05/10/2013  . AKI (acute kidney injury) ([place]) 02/10/2013  . Benign hypertension 02/10/2013  . Postoperative anemia due to acute blood loss 02/09/2013  . Malignant  neoplasm of kidney excluding renal pelvis ([place]) 02/08/2013  . Renal mass, left 12/22/2012  . Myalgia and myositis 06/16/2012  . Unspecified vitamin D deficiency 06/16/2012  . Fatty liver disease, nonalcoholic 78/58/8502  . Family hx of colon cancer 06/14/2012  . Hx of adenomatous colonic polyps 06/14/2012  . Neck pain on right side 05/03/2012  . Coronary atherosclerosis of native coronary artery 03/17/2012  . Edema 03/17/2012  . Hx of surgical procedure 12/19/2011  . Hyperlipidemia due to type 2 diabetes mellitus ([place]) 12/14/2011  . Other screening mammogram 11/05/2010  . 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 Diabetes mellitus type II, uncontrolled ([place]) 07/31/2010  . 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  . UTI 12/24/2009  . HYPERLIPIDEMIA 10/30/2009  . Chronic renal insufficiency, stage III (moderate) 10/30/2009  . GERD 08/20/2008  . ESOPHAGEAL STRICTURE 03/29/2008  . Seasonal and perennial allergic rhinitis 01/18/2007  . GOUT 08/06/2006  . Hypertension, renal disease 08/06/2006  . Asthma-COPD overlap syndrome ([place]) 08/06/2006  . CEREBROVASCULAR ACCIDENT, HX OF 08/06/2006    Past Surgical History:  Procedure Laterality Date  . ABDOMINAL HYSTERECTOMY    . ABDOMINAL HYSTERECTOMY    . APPENDECTOMY    . CESAREAN SECTION     x 2  . COLONOSCOPY WITH PROPOFOL N/A 07/23/2015   Procedure: COLONOSCOPY WITH PROPOFOL;  Surgeon: Manya Silvas, MD;  Location: [facility] ENDOSCOPY;  Service: Endoscopy;  Laterality: N/A;  . ESOPHAGOGASTRODUODENOSCOPY (EGD) WITH PROPOFOL N/A 07/23/2015  Procedure: ESOPHAGOGASTRODUODENOSCOPY (EGD) WITH PROPOFOL;  Surgeon: Manya Silvas, MD;  Location: [facility] ENDOSCOPY;  Service: Endoscopy;  Laterality: N/A;  . HEMORROIDECTOMY      Prior to Admission medications   Medication Sig Start Date End Date Taking? Authorizing Provider  albuterol (PROVENTIL HFA;VENTOLIN HFA) 108 (90 Base) MCG/ACT inhaler 2 puffs every 6 hours as needed for wheeze/ cough 05/28/15    Young, Tarri Fuller D, MD  allopurinol (ZYLOPRIM) 100 MG tablet Take 100 mg by mouth daily. 06/26/18   [provider]  amLODipine (NORVASC) 5 MG tablet Take 10 mg by mouth daily.  06/03/17   [provider]  atorvastatin (LIPITOR) 40 MG tablet Take 40 mg by mouth daily.    [provider]  bisoprolol (ZEBETA) 5 MG tablet Take 5 mg by mouth daily.    [provider]  calcitRIOL (ROCALTROL) 0.25 MCG capsule TAKE 1 CAPSULE BY MOUTH THREE TIMES A WEEK 06/29/17   [provider]  clotrimazole-betamethasone (LOTRISONE) cream Apply topically. 04/26/17   [provider]  colchicine 0.6 MG tablet TAKE TWO TABLETS(1.2MG )BY MOUTH AT THE FIRST SIGN OF GOUT FLARE FOLLOWED BY ONE TABLET(0.6MG ) AFTER ONE HOUR. MAX OF 1.8MG  WITHIN ONE HOUR 03/18/17   [provider]  ergocalciferol (VITAMIN D2) 50000 UNITS capsule Take 50,000 Units by mouth every 30 (thirty) days.    [provider]  furosemide (LASIX) 40 MG tablet Take 1 tablet by mouth daily. 02/21/12   [provider]  glipiZIDE (GLUCOTROL XL) 5 MG 24 hr tablet Take 1 tablet (5 mg total) by mouth daily with breakfast. 07/15/15   Philemon Kingdom, MD  glucose blood (ONE TOUCH ULTRA TEST) test strip USE tID 05/22/12   Philemon Kingdom, MD  hyoscyamine (LEVSIN SL) 0.125 MG SL tablet Place under the tongue. 06/24/17 07/24/17  [provider]  insulin aspart (NOVOLOG) 100 UNIT/ML injection Inject 6 Units into the skin daily before supper. [place] 12/05/15   Philemon Kingdom, MD  Insulin Degludec (TRESIBA FLEXTOUCH) 200 UNIT/ML SOPN Inject into the skin. 05/05/17   [provider]  Insulin Pen Needle (CAREFINE PEN NEEDLES) 32G X 4 MM MISC Use 2x a day 12/15/15   Philemon Kingdom, MD  lisinopril (PRINIVIL,ZESTRIL) 5 MG tablet Take 5 mg by mouth daily. Reported on 07/15/2015    [provider]  pantoprazole (PROTONIX) 40 MG tablet Take 40 mg by mouth daily.    [provider]   PLAVIX 75 MG tablet TAKE 1 TABLET BY MOUTH ONCE A DAY 07/28/12   Janith Lima, MD  pregabalin (LYRICA) 50 MG capsule Take 1 capsule (50 mg total) by mouth as needed. Patient taking differently: Take 50 mg by mouth daily.  10/24/12   Janith Lima, MD  spironolactone-hydrochlorothiazide (ALDACTAZIDE) 25-25 MG tablet Take by mouth.    [provider]  ULORIC 40 MG tablet Take 80 mg by mouth daily.  11/15/12   [provider]  umeclidinium-vilanterol (ANORO ELLIPTA) 62.5-25 MCG/INH AEPB Inhale 1 puff into the lungs daily. 10/18/18   Deneise Lever, MD  zolpidem (AMBIEN) 5 MG tablet TAKE 1 TABLET BY MOUTH AT BEDTIME AS NEEDED 07/20/12   Janith Lima, MD    Allergies  Allergen Reactions  . Crestor [Rosuvastatin]     myopathy  . Iodinated Diagnostic Agents Other (See Comments)    Nausea and SOB per patient Nausea and SOB per patient  . Pioglitazone     REACTION: weight gain  . Sulfonamide Derivatives  REACTION: bLISTERS  . Losartan Swelling  . Sulfasalazine Hives    REACTION: bLISTERS    Family History  Problem Relation Age of Onset  . GER disease Father   . Heart failure Father   . Colon polyps Father   . Ovarian cancer Mother   . Colon cancer Other        aunt  . Diabetes Other   . Colon polyps Other        aunt  . CAD Neg Hx     Social History Social History   Tobacco Use  . Smoking status: Former Smoker    Packs/day: 1.00    Years: 30.00    Pack years: 30.00    Types: Cigarettes    Quit date: 01/18/2002    Years since quitting: 17.0  . Smokeless tobacco: Never Used  Substance Use Topics  . Alcohol use: No  . Drug use: No    Review of Systems Constitutional: Negative for fever. Cardiovascular: Negative for chest pain. Respiratory: Negative for shortness of breath. Gastrointestinal: Negative for abdominal pain.  Positive for nausea. Genitourinary: Negative for urinary compaints Musculoskeletal: Negative for musculoskeletal  complaints Neurological: Negative for headache All other ROS negative  ____________________________________________   PHYSICAL EXAM:  VITAL SIGNS: ED Triage Vitals  Enc Vitals Group     BP 01/28/19 1356 (!) 140/115     Pulse Rate 01/28/19 1356 (!) 48     Resp 01/28/19 1356 17     Temp 01/28/19 1356 (!) 97.5 F (36.4 C)     Temp Source 01/28/19 1356 Oral     SpO2 01/28/19 1356 99 %     Weight 01/28/19 1357 206 lb (93.4 kg)     Height 01/28/19 1357 5\' 3"  (1.6 m)     Head Circumference --      Peak Flow --      Pain Score 01/28/19 1357 5     Pain Loc --      Pain Edu? --      Excl. in [place]? --    Constitutional: Alert and oriented. Well appearing and in no distress. Eyes: Normal exam ENT      Head: Normocephalic and atraumatic      Mouth/Throat: Mucous membranes are moist. Cardiovascular: Normal rate, regular rhythm. Respiratory: Normal respiratory effort without tachypnea nor retractions. Breath sounds are clear  Gastrointestinal: Soft and nontender. No distention.   Musculoskeletal: Nontender with normal range of motion in all extremities.  Neurologic:  Normal speech and language. No gross focal neurologic deficits Skin:  Skin is warm, dry and intact.  Psychiatric: Mood and affect are normal.   ____________________________________________    EKG  EKG viewed and interpreted by myself shows a sinus bradycardia at 50 bpm with a narrow QRS, normal axis, normal intervals, no concerning ST changes.  ____________________________________________   INITIAL IMPRESSION / ASSESSMENT AND PLAN / ED COURSE  Pertinent labs & imaging results that were available during my care of the patient were reviewed by me and considered in my medical decision making (see chart for details).   Patient presents to the emergency department for nausea weakness lightheadedness especially upon standing.  Patient is rated cardiac as low as 40 bpm at times.  Denies any fever cough or shortness of  breath, no known Covid exposure.  We will check labs and continue to closely monitor.  We will IV hydrate while awaiting results.  Patient's labs have resulted showing a creatinine of 4.4 significantly higher than her baseline  around 2.0.  Patient is also hyperkalemic with a potassium of 6.6 which could possibly be contributing to her intermittent bradycardia.  Given these findings we will dose 1 g of calcium gluconate.  I have ordered 10 units of insulin and a amp of D50.  I spoke to the patient's nephrologist Dr. Juleen China who would also like an amp of bicarb given for the patient.  He will be in to see the patient.  I have admitted the patient to the hospital service.  No Covid symptoms, Covid swab is pending.  Megan [name] was evaluated in Emergency Department on 01/28/2019 for the symptoms described in the history of present illness. She was evaluated in the context of the global COVID-19 pandemic, which necessitated consideration that the patient might be at risk for infection with the SARS-CoV-2 virus that causes COVID-19. Institutional protocols and algorithms that pertain to the evaluation of patients at risk for COVID-19 are in a state of rapid change based on information released by regulatory bodies including the CDC and federal and state organizations. These policies and algorithms were followed during the patient's care in the ED.  CRITICAL CARE Performed by: Harvest Dark   Total critical care time: 30 minutes  Critical care time was exclusive of separately billable procedures and treating other patients.  Critical care was necessary to treat or prevent imminent or life-threatening deterioration.  Critical care was time spent personally by me on the following activities: development of treatment plan with patient and/or surrogate as well as nursing, discussions with consultants, evaluation of patient's response to treatment, examination of patient, obtaining history from patient or  surrogate, ordering and performing treatments and interventions, ordering and review of laboratory studies, ordering and review of radiographic studies, pulse oximetry and re-evaluation of patient's condition.  ____________________________________________   FINAL CLINICAL IMPRESSION(S) / ED DIAGNOSES  Hyperkalemia Weakness Bradycardia   Harvest Dark, MD 01/28/19 1515    Harvest Dark, MD 01/28/19 1515

## 2019-01-28 NOTE — ED Notes (Signed)
Pt taken for scans 

## 2019-01-28 NOTE — H&P (Signed)
[facility] [place] NAME: Megan [name]    MR#:  [id number]  DATE OF BIRTH:  [birth year]  DATE OF ADMISSION:  01/28/2019  PRIMARY CARE PHYSICIAN: Tracie Harrier, MD   REQUESTING/REFERRING PHYSICIAN: Dr Harvest Dark  CHIEF COMPLAINT:   Chief Complaint  Patient presents with  . Nausea  . Shortness of Breath    HISTORY OF PRESENT ILLNESS:  Megan [name]  is a 79 y.o. 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  She states that her Lasix was recently increased to 2 pills daily because she had some swelling on her legs.  Today when she woke up she felt dizzy and did not feel well.  She had to go to the bathroom and had an episode of diarrhea.  She took some Glucerna but her sugar was not low.  They lied her down and put a fan on her and she seemed to get better but she did have sweating with this episode.  No complaints of chest pain or shortness of breath. In the ER, her potassium was found to be 6.6 and her creatinine was 4.4.  Hospitalist services were contacted for further evaluation.  Patient does have a little pain in the back of her head.  PAST MEDICAL HISTORY:   Past Medical History:  Diagnosis Date  . Adenomatous colon polyp   . Allergic rhinitis   . Asthma   . COPD (chronic obstructive pulmonary disease) ([place])   . Diabetes mellitus type II   . Diverticulosis of colon   . Esophageal stricture   . Fatty liver   . Gastritis   . GERD (gastroesophageal reflux disease)   . Gout   . History of chickenpox   . History of CVA (cerebrovascular accident) 2004  . Hyperlipidemia   . Hypertension   . Internal hemorrhoids   . Kidney tumor 11/2012  . Loss of smell 2004   due to CVA  . Periumbilical hernia   . Renal insufficiency   . Umbilical hernia     PAST SURGICAL HISTORY:   Past Surgical History:  Procedure Laterality Date  . ABDOMINAL HYSTERECTOMY    . ABDOMINAL  HYSTERECTOMY    . APPENDECTOMY    . CESAREAN SECTION     x 2  . COLONOSCOPY WITH PROPOFOL N/A 07/23/2015   Procedure: COLONOSCOPY WITH PROPOFOL;  Surgeon: Manya Silvas, MD;  Location: [facility] ENDOSCOPY;  Service: Endoscopy;  Laterality: N/A;  . ESOPHAGOGASTRODUODENOSCOPY (EGD) WITH PROPOFOL N/A 07/23/2015   Procedure: ESOPHAGOGASTRODUODENOSCOPY (EGD) WITH PROPOFOL;  Surgeon: Manya Silvas, MD;  Location: [facility] ENDOSCOPY;  Service: Endoscopy;  Laterality: N/A;  . HEMORROIDECTOMY      SOCIAL HISTORY:   Social History   Tobacco Use  . Smoking status: Former Smoker    Packs/day: 1.00    Years: 30.00    Pack years: 30.00    Types: Cigarettes    Quit date: 01/18/2002    Years since quitting: 17.0  . Smokeless tobacco: Never Used  Substance Use Topics  . Alcohol use: No    FAMILY HISTORY:   Family History  Problem Relation Age of Onset  . GER disease Father   . Heart failure Father   . Colon polyps Father   . Ovarian cancer Mother   . Colon cancer Other        aunt  . Diabetes Other   . Colon polyps Other  aunt  . CAD Neg Hx     DRUG ALLERGIES:   Allergies  Allergen Reactions  . Crestor [Rosuvastatin]     myopathy  . Iodinated Diagnostic Agents Other (See Comments)    Nausea and SOB per patient Nausea and SOB per patient  . Pioglitazone     REACTION: weight gain  . Sulfonamide Derivatives     REACTION: bLISTERS  . Losartan Swelling  . Sulfasalazine Hives    REACTION: bLISTERS    REVIEW OF SYSTEMS:  CONSTITUTIONAL: No fever, chills.  Positive for sweats.  Positive for fatigue.  EYES: No blurred or double vision.  EARS, NOSE, AND THROAT: No tinnitus or ear pain. No sore throat RESPIRATORY: No cough, occasional shortness of breath, no wheezing or hemoptysis.  CARDIOVASCULAR: No chest pain, orthopnea, edema.  GASTROINTESTINAL: Some nausea.no vomiting.  One episode of diarrhea .  Some abdominal pain that went away.  No blood in bowel  movements GENITOURINARY: No dysuria, hematuria.  ENDOCRINE: No polyuria, nocturia,  HEMATOLOGY: No anemia, easy bruising or bleeding SKIN: No rash or lesion. MUSCULOSKELETAL: No joint pain or arthritis.   NEUROLOGIC: No tingling, numbness, weakness.  PSYCHIATRY: No anxiety or depression.   MEDICATIONS AT HOME:   Prior to Admission medications   Medication Sig Start Date End Date Taking? Authorizing Provider  albuterol (PROVENTIL HFA;VENTOLIN HFA) 108 (90 Base) MCG/ACT inhaler 2 puffs every 6 hours as needed for wheeze/ cough 05/28/15   Young, Tarri Fuller D, MD  allopurinol (ZYLOPRIM) 100 MG tablet Take 100 mg by mouth daily. 06/26/18   [provider]  amLODipine (NORVASC) 5 MG tablet Take 10 mg by mouth daily.  06/03/17   [provider]  atorvastatin (LIPITOR) 40 MG tablet Take 40 mg by mouth daily.    [provider]  bisoprolol (ZEBETA) 5 MG tablet Take 5 mg by mouth daily.    [provider]  calcitRIOL (ROCALTROL) 0.25 MCG capsule TAKE 1 CAPSULE BY MOUTH THREE TIMES A WEEK 06/29/17   [provider]  clotrimazole-betamethasone (LOTRISONE) cream Apply topically. 04/26/17   [provider]  colchicine 0.6 MG tablet TAKE TWO TABLETS(1.2MG )BY MOUTH AT THE FIRST SIGN OF GOUT FLARE FOLLOWED BY ONE TABLET(0.6MG ) AFTER ONE HOUR. MAX OF 1.8MG  WITHIN ONE HOUR 03/18/17   [provider]  ergocalciferol (VITAMIN D2) 50000 UNITS capsule Take 50,000 Units by mouth every 30 (thirty) days.    [provider]  furosemide (LASIX) 40 MG tablet Take 1 tablet by mouth daily. 02/21/12   [provider]  glipiZIDE (GLUCOTROL XL) 5 MG 24 hr tablet Take 1 tablet (5 mg total) by mouth daily with breakfast. 07/15/15   Philemon Kingdom, MD  glucose blood (ONE TOUCH ULTRA TEST) test strip USE tID 05/22/12   Philemon Kingdom, MD  hyoscyamine (LEVSIN SL) 0.125 MG SL tablet Place under the tongue. 06/24/17 07/24/17  [provider]  insulin aspart  (NOVOLOG) 100 UNIT/ML injection Inject 6 Units into the skin daily before supper. [place] 12/05/15   Philemon Kingdom, MD  Insulin Degludec (TRESIBA FLEXTOUCH) 200 UNIT/ML SOPN Inject into the skin. 05/05/17   [provider]  Insulin Pen Needle (CAREFINE PEN NEEDLES) 32G X 4 MM MISC Use 2x a day 12/15/15   Philemon Kingdom, MD  lisinopril (PRINIVIL,ZESTRIL) 5 MG tablet Take 5 mg by mouth daily. Reported on 07/15/2015    [provider]  pantoprazole (PROTONIX) 40 MG tablet Take 40 mg by mouth daily.    [provider]  PLAVIX  75 MG tablet TAKE 1 TABLET BY MOUTH ONCE A DAY 07/28/12   Janith Lima, MD  pregabalin (LYRICA) 50 MG capsule Take 1 capsule (50 mg total) by mouth as needed. Patient taking differently: Take 50 mg by mouth daily.  10/24/12   Janith Lima, MD  spironolactone-hydrochlorothiazide (ALDACTAZIDE) 25-25 MG tablet Take by mouth.    [provider]  ULORIC 40 MG tablet Take 80 mg by mouth daily.  11/15/12   [provider]  umeclidinium-vilanterol (ANORO ELLIPTA) 62.5-25 MCG/INH AEPB Inhale 1 puff into the lungs daily. 10/18/18   Baird Lyons D, MD  zolpidem (AMBIEN) 5 MG tablet TAKE 1 TABLET BY MOUTH AT BEDTIME AS NEEDED 07/20/12   Janith Lima, MD      VITAL SIGNS:  Blood pressure (!) 151/57, pulse (!) 45, temperature (!) 97.5 F (36.4 C), temperature source Oral, resp. rate (!) 21, height 5\' 3"  (1.6 m), weight 93.4 kg, SpO2 96 %.  PHYSICAL EXAMINATION:  GENERAL:  79 y.o.-year-old patient lying in the bed with no acute distress.  EYES: Pupils equal, round, reactive to light and accommodation. No scleral icterus. Extraocular muscles intact.  HEENT: Head atraumatic, normocephalic. Oropharynx and nasopharynx clear.  NECK:  Supple, no jugular venous distention. No thyroid enlargement, no tenderness.  LUNGS: Normal breath sounds bilaterally, no wheezing, rales,rhonchi or crepitation. No use of accessory muscles of respiration.   CARDIOVASCULAR: S1, S2 normal. No murmurs, rubs, or gallops.  ABDOMEN: Soft, slight epigastric pain, nondistended. Bowel sounds present. No organomegaly or mass.  EXTREMITIES: No pedal edema, cyanosis, or clubbing.  NEUROLOGIC: Cranial nerves II through XII are intact. Muscle strength 5/5 in all extremities. Sensation intact. Gait not checked.  PSYCHIATRIC: The patient is alert and oriented x 3.  SKIN: No rash, lesion, or ulcer.   LABORATORY PANEL:   CBC Recent Labs  Lab 01/28/19 1401  WBC 11.3*  HGB 12.1  HCT 37.6  PLT 276   ------------------------------------------------------------------------------------------------------------------  Chemistries  Recent Labs  Lab 01/28/19 1401 01/28/19 1406  NA 132*  --   K 6.6*  --   CL 103  --   CO2 18*  --   GLUCOSE 191*  --   BUN 82*  --   CREATININE 4.40*  --   CALCIUM 9.4  --   AST  --  20  ALT  --  14  ALKPHOS  --  64  BILITOT  --  1.0   ------------------------------------------------------------------------------------------------------------------    EKG:   Interpreted by me, sinus bradycardia 50 bpm with no ST-T wave changes  IMPRESSION AND PLAN:   1.  Severe hyperkalemia.  Stat treatment with calcium, bicarb, insulin, D50.  Start Lokelma twice daily. 2.  Acute kidney injury on chronic kidney disease stage IV.  Hold spironolactone hydrochlorothiazide, hold lisinopril.  Gentle IV fluid.  ER physician spoke with nephrologist. 3.  Bradycardia.  Hold Zebeta.  Give calcium IV. 4.  Essential hypertension.  Check orthostatic vital signs.  Hold most of her antihypertensive medications at this time.  Can give Norvasc. 5.  Type 2 diabetes mellitus with chronic kidney disease stage IV.  Hold oral medications and go with sliding scale for now. 6.  GERD on Protonix 7.  History of stroke on Plavix and atorvastatin.  Add on a CPK. 8.  Pain in the back of the head will get a CT of the head without contrast 9.  Covid  swab 10.  History of gout decrease allopurinol dose and Uloric dose  All the records are reviewed and case discussed with ED provider. Management plans discussed with the patient, family and they are in agreement.  CODE STATUS: Full code  TOTAL TIME TAKING CARE OF THIS PATIENT: 50 minutes.    Loletha Grayer M.D on 01/28/2019 at 3:34 PM  Between 7am to 6pm - Pager - [phone]  After 6pm call admission pager [phone]  [facility]  CC: Primary care physician; Tracie Harrier, MD

## 2019-01-28 NOTE — ED Notes (Signed)
Report given to Tom, RN.

## 2019-01-28 NOTE — ED Notes (Signed)
Pt given meal tray.

## 2019-01-28 NOTE — ED Triage Notes (Signed)
Pt arrives to ED from home for SOB with movement and nausea. SOB x weeks. Nausea today. Kidney doctors increased pt lasix recently, pt has lost 8lbs since increase. Original sats 93% on RA, EMS placed pt on 3 L [place] and came up to 100%. No distress noted upon arrival. A&O. Denies cough or fever.

## 2019-01-29 ENCOUNTER — Encounter: Payer: Self-pay | Admitting: Internal Medicine

## 2019-01-29 LAB — CBC
HCT: 34.5 % — ABNORMAL LOW (ref 36.0–46.0)
Hemoglobin: 11.1 g/dL — ABNORMAL LOW (ref 12.0–15.0)
MCH: 29 pg (ref 26.0–34.0)
MCHC: 32.2 g/dL (ref 30.0–36.0)
MCV: 90.1 fL (ref 80.0–100.0)
Platelets: 232 10*3/uL (ref 150–400)
RBC: 3.83 MIL/uL — ABNORMAL LOW (ref 3.87–5.11)
RDW: 14.6 % (ref 11.5–15.5)
WBC: 7.4 10*3/uL (ref 4.0–10.5)
nRBC: 0 % (ref 0.0–0.2)

## 2019-01-29 LAB — HEMOGLOBIN A1C
Hgb A1c MFr Bld: 7.1 % — ABNORMAL HIGH (ref 4.8–5.6)
Mean Plasma Glucose: 157.07 mg/dL

## 2019-01-29 LAB — BASIC METABOLIC PANEL
Anion gap: 10 (ref 5–15)
BUN: 76 mg/dL — ABNORMAL HIGH (ref 8–23)
CO2: 20 mmol/L — ABNORMAL LOW (ref 22–32)
Calcium: 9 mg/dL (ref 8.9–10.3)
Chloride: 107 mmol/L (ref 98–111)
Creatinine, Ser: 3.98 mg/dL — ABNORMAL HIGH (ref 0.44–1.00)
GFR calc Af Amer: 12 mL/min — ABNORMAL LOW (ref >60)
GFR calc non Af Amer: 10 mL/min — ABNORMAL LOW (ref >60)
Glucose, Bld: 153 mg/dL — ABNORMAL HIGH (ref 70–99)
Potassium: 5.3 mmol/L — ABNORMAL HIGH (ref 3.5–5.1)
Sodium: 137 mmol/L (ref 135–145)

## 2019-01-29 LAB — GLUCOSE, CAPILLARY
Glucose-Capillary: 107 mg/dL — ABNORMAL HIGH (ref 70–99)
Glucose-Capillary: 108 mg/dL — ABNORMAL HIGH (ref 70–99)
Glucose-Capillary: 247 mg/dL — ABNORMAL HIGH (ref 70–99)
Glucose-Capillary: 198 mg/dL — ABNORMAL HIGH (ref 70–99)
Glucose-Capillary: 132 mg/dL — ABNORMAL HIGH (ref 70–99)

## 2019-01-29 MED ORDER — INSULIN ASPART 100 UNIT/ML ~~LOC~~ SOLN
SUBCUTANEOUS | Status: AC
  Filled 2019-01-29: qty 1

## 2019-01-29 MED ORDER — INSULIN ASPART 100 UNIT/ML ~~LOC~~ SOLN
SUBCUTANEOUS | Status: AC
  Administered 2019-01-29: 23:00:00 2 [IU] via SUBCUTANEOUS
  Filled 2019-01-29: qty 2

## 2019-01-29 MED ORDER — INSULIN ASPART 100 UNIT/ML ~~LOC~~ SOLN
0.0000 [IU] | Freq: Every day | SUBCUTANEOUS | Status: DC
  Administered 2019-01-31: 3 [IU] via SUBCUTANEOUS

## 2019-01-29 MED ORDER — INSULIN ASPART 100 UNIT/ML ~~LOC~~ SOLN
SUBCUTANEOUS | Status: AC
  Administered 2019-01-29: 3 [IU] via SUBCUTANEOUS
  Filled 2019-01-29: qty 1

## 2019-01-29 MED ORDER — SODIUM BICARBONATE-DEXTROSE 150-5 MEQ/L-% IV SOLN
150.0000 meq | INTRAVENOUS | Status: DC
  Administered 2019-01-29 – 2019-01-31 (×3): 150 meq via INTRAVENOUS
  Filled 2019-01-29 (×5): qty 1000

## 2019-01-29 MED ORDER — HEPARIN SODIUM (PORCINE) 5000 UNIT/ML IJ SOLN
INTRAMUSCULAR | Status: AC
  Administered 2019-01-29: 5000 [IU] via SUBCUTANEOUS
  Filled 2019-01-29: qty 1

## 2019-01-29 MED ORDER — INSULIN ASPART 100 UNIT/ML ~~LOC~~ SOLN
0.0000 [IU] | Freq: Three times a day (TID) | SUBCUTANEOUS | Status: DC
  Administered 2019-01-29 – 2019-01-30 (×2): 5 [IU] via SUBCUTANEOUS
  Administered 2019-01-31: 08:00:00 3 [IU] via SUBCUTANEOUS

## 2019-01-29 NOTE — Progress Notes (Signed)
PROGRESS NOTE                                                                                                                                                                                                             Patient Demographics:    Megan [name], is a 79 y.o. female, DOB - 1940/04/11, CBS:[id number]  Admit date - 01/28/2019   Admitting Physician Loletha Grayer, MD  Outpatient Primary MD for the patient is Tracie Harrier, MD  LOS - 1    Chief Complaint  Patient presents with  . Nausea  . 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  She had an episode of diarrhea and diaphoresis. Her Lasix dose was increased recently due to leg swellings.  In the ED she was hyperkalemic with potassium of 6.6 and creatinine of 4.4.  Also found to have metabolic acidosis.  Given calcium gluconate, IV insulin and sodium bicarb. Admitted for further management.   Subjective:   Patient reports feeling weak but not dizzy or diaphoretic.  No nausea or vomiting.  No overnight events.  Reports some pain in her epigastric bilateral upper quadrants.   Assessment  & Plan :    Active Problems:   Acute kidney injury superimposed on CKD stage IV (HCC) Likely due to overdiuresis versus disease progression.  Associated metabolic acidosis.  On multiple medication including Lasix, lisinopril, HCTZ and Aldactone all have been held.  Gentle hydration. Creatinine slightly improved this morning.  Strict I's/O and daily weight. Nephrology following.  Active problems Hyperkalemia Stable on monitor.  Received calcium gluconate, bicarb, insulin with  dextrose.  Potassium improved this morning.  Started on [place].  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 History of stroke Continue Plavix and statin   Essential hypertension Stable.  Multiple home meds including bisoprolol, amlodipine, lisinopril, Lasix and Aldactone.  Continue amlodipine only..  Beta-blocker held due to bradycardia.  Rest held due to AKI and hyperkalemia.  Secondary hyperparathyroidism On calcitriol.  Chronic gout Continue Uloric.  Epigastric and bilateral  upper quadrant pain and tenderness. LFTs normal.  Will check ultrasound of the abdomen.  Code Status : Full code  Family Communication  : None  Disposition Plan  : Home pending clinical improvement, improved renal function, possibly in the next 48-72 hours  Barriers For Discharge : Active symptoms  Consults  : Nephrology  Procedures  : CT head  DVT Prophylaxis  : Subcu heparin  Lab Results  Component Value Date   PLT 232 01/29/2019    Antibiotics  :    Anti-infectives (From admission, onward)   None        Objective:   Vitals:   01/28/19 2200 01/28/19 2230 01/29/19 0000 01/29/19 0100  BP: (!) 135/49 (!) 132/53 (!) 140/52 (!) 164/61  Pulse: (!) 51 (!) 50 (!) 53 (!) 51  Resp: 17 15 16 18   Temp:    (!) 97.3 F (36.3 C)  TempSrc:    Temporal  SpO2: 97% 99% 98% 100%  Weight:    93.9 kg  Height:    5\' 3"  (1.6 m)    Wt Readings from Last 3 Encounters:  01/29/19 93.9 kg  01/16/18 97.9 kg  12/26/17 101.2 kg     Intake/Output Summary (Last 24 hours) at 01/29/2019 1148 Last data filed at 01/28/2019 1921 Gross per 24 hour  Intake 1050 ml  Output --  Net 1050 ml     Physical Exam  Gen: Elderly female appears fatigued, not in distress HEENT:  moist mucosa, supple neck Chest: clear b/l, no added sounds CVS: N S1&S2, no murmurs,  GI: soft, nondistended, epigastric tenderness to  pressure. Musculoskeletal: warm, no edema     Data Review:    CBC Recent Labs  Lab 01/28/19 1401 01/29/19 0530  WBC 11.3* 7.4  HGB 12.1 11.1*  HCT 37.6 34.5*  PLT 276 232  MCV 90.6 90.1  MCH 29.2 29.0  MCHC 32.2 32.2  RDW 14.6 14.6    Chemistries  Recent Labs  Lab 01/28/19 1401 01/28/19 1406 01/28/19 2003 01/29/19 0530  NA 132*  --  135 137  K 6.6*  --  5.9* 5.3*  CL 103  --  106 107  CO2 18*  --  19* 20*  GLUCOSE 191*  --  271* 153*  BUN 82*  --  81* 76*  CREATININE 4.40*  --  4.13* 3.98*  CALCIUM 9.4  --  8.7* 9.0  AST  --  20  --   --   ALT  --  14  --   --   ALKPHOS  --  64  --   --   BILITOT  --  1.0  --   --    ------------------------------------------------------------------------------------------------------------------ No results for input(s): CHOL, HDL, LDLCALC, TRIG, CHOLHDL, LDLDIRECT in the last 72 hours.  Lab Results  Component Value Date   HGBA1C 7.7 (H) 05/23/2014   ------------------------------------------------------------------------------------------------------------------ No results for input(s): TSH, T4TOTAL, T3FREE, THYROIDAB in the last 72 hours.  Invalid input(s): FREET3 ------------------------------------------------------------------------------------------------------------------ No results for input(s): VITAMINB12, FOLATE, FERRITIN, TIBC, IRON, RETICCTPCT in the last 72 hours.  Coagulation profile No results for input(s): INR, PROTIME in the last 168 hours.  No results for input(s): DDIMER in the last 72 hours.  Cardiac Enzymes No results for input(s): CKMB, TROPONINI, MYOGLOBIN in the last 168 hours.  Invalid input(s): CK ------------------------------------------------------------------------------------------------------------------    Component Value Date/Time   BNP 53.0 01/28/2019 1401    Inpatient Medications  Scheduled Meds: . allopurinol  50 mg Oral Daily  .  amLODipine  10 mg Oral Daily  .  atorvastatin  40 mg Oral Daily  . calcitRIOL  0.25 mcg Oral Daily  . clopidogrel  75 mg Oral Daily  . heparin  5,000 Units Subcutaneous Q8H  . insulin aspart  0-15 Units Subcutaneous TID WC  . insulin aspart  0-5 Units Subcutaneous QHS  . pantoprazole  40 mg Oral Daily  . sodium zirconium cyclosilicate  10 g Oral BID  . umeclidinium-vilanterol  1 puff Inhalation Daily   Continuous Infusions: . sodium bicarbonate 150 mEq in dextrose 5% 1000 mL     PRN Meds:.acetaminophen **OR** acetaminophen, albuterol, ondansetron **OR** ondansetron (ZOFRAN) IV, zolpidem  Micro Results Recent Results (from the past 240 hour(s))  Respiratory Panel by RT PCR (Flu A&B, Covid) - Nasopharyngeal Swab     Status: None   Collection Time: 01/28/19  4:18 PM   Specimen: Nasopharyngeal Swab  Result Value Ref Range Status   SARS Coronavirus 2 by RT PCR NEGATIVE NEGATIVE Final    Comment: (NOTE) SARS-CoV-2 target nucleic acids are NOT DETECTED. The SARS-CoV-2 RNA is generally detectable in upper respiratoy specimens during the acute phase of infection. The lowest concentration of SARS-CoV-2 viral copies this assay can detect is 131 copies/mL. A negative result does not preclude SARS-Cov-2 infection and should not be used as the sole basis for treatment or other patient management decisions. A negative result may occur with  improper specimen collection/handling, submission of specimen other than nasopharyngeal swab, presence of viral mutation(s) within the areas targeted by this assay, and inadequate number of viral copies (<131 copies/mL). A negative result must be combined with clinical observations, patient history, and epidemiological information. The expected result is Negative. Fact Sheet for Patients:  PinkCheek.be Fact Sheet for Healthcare Providers:  GravelBags.it This test is not yet ap proved or cleared by the [place] FDA and  has  been authorized for detection and/or diagnosis of SARS-CoV-2 by FDA under an Emergency Use Authorization (EUA). This EUA will remain  in effect (meaning this test can be used) for the duration of the COVID-19 declaration under Section 564(b)(1) of the Act, 21 U.S.C. section 360bbb-3(b)(1), unless the authorization is terminated or revoked sooner.    Influenza A by PCR NEGATIVE NEGATIVE Final   Influenza B by PCR NEGATIVE NEGATIVE Final    Comment: (NOTE) The Xpert Xpress SARS-CoV-2/FLU/RSV assay is intended as an aid in  the diagnosis of influenza from Nasopharyngeal swab specimens and  should not be used as a sole basis for treatment. Nasal washings and  aspirates are unacceptable for Xpert Xpress SARS-CoV-2/FLU/RSV  testing. Fact Sheet for Patients: PinkCheek.be Fact Sheet for Healthcare Providers: GravelBags.it This test is not yet approved or cleared by the [place] FDA and  has been authorized for detection and/or diagnosis of SARS-CoV-2 by  FDA under an Emergency Use Authorization (EUA). This EUA will remain  in effect (meaning this test can be used) for the duration of the  Covid-19 declaration under Section 564(b)(1) of the Act, 21  U.S.C. section 360bbb-3(b)(1), unless the authorization is  terminated or revoked. Performed at [facility], [address], [place], [place] [address]     Radiology Reports CT HEAD WO CONTRAST  Result Date: 01/28/2019 CLINICAL DATA:  Headache EXAM: CT HEAD WITHOUT CONTRAST TECHNIQUE: Contiguous axial images were obtained from the base of the skull through the vertex without intravenous contrast. COMPARISON:  CT head dated 11/22/2013. FINDINGS: Brain: No evidence of acute infarction, hemorrhage, hydrocephalus, extra-axial collection  or mass lesion/mass effect. There is a chronic infarct of the left corona radiata. Periventricular white matter hypoattenuation likely  represents chronic small vessel ischemic disease. Vascular: There are vascular calcifications in the carotid siphons. Skull: Normal. Negative for fracture or focal lesion. Sinuses/Orbits: No acute finding. Other: None. IMPRESSION: 1. No acute intracranial process. Electronically Signed   By: Zerita Boers M.D.   On: 01/28/2019 16:21   DG Chest Port 1 View  Result Date: 01/28/2019 CLINICAL DATA:  Shortness of breath. EXAM: PORTABLE CHEST 1 VIEW COMPARISON:  December 29, 2017 FINDINGS: The heart size is mildly enlarged but stable. The hila and mediastinum are normal. No pneumothorax. No nodules or masses. No focal infiltrates or overt edema. IMPRESSION: No active disease. Electronically Signed   By: Dorise Bullion III M.D   On: 01/28/2019 16:17    Time Spent in minutes 35   Dontavion Noxon M.D on 01/29/2019 at 11:48 AM  Between 7am to 7pm - Pager - [phone]  After 7pm go to [email or web address] - password [facility]  [facility] -  Office  [phone]

## 2019-01-29 NOTE — ED Notes (Addendum)
ED TO INPATIENT HANDOFF REPORT  ED Nurse Name and Phone #:  Gershon Mussel RN  [phone]  S Name/Age/Gender [name] 79 y.o. female Room/Bed: ED07A/ED07A  Code Status   Code Status: Full Code  Home/SNF/Other Home Patient oriented to: self, place, time and situation Is this baseline? Yes   Triage Complete: Triage complete  Chief Complaint Hyperkalemia [E87.5]  Triage Note Pt arrives to ED from home for SOB with movement and nausea. SOB x weeks. Nausea today. Kidney doctors increased pt lasix recently, pt has lost 8lbs since increase. Original sats 93% on RA, EMS placed pt on 3 L [place] and came up to 100%. No distress noted upon arrival. A&O. Denies cough or fever.     Allergies Allergies  Allergen Reactions  . Crestor [Rosuvastatin]     myopathy  . Iodinated Diagnostic Agents Other (See Comments)    Nausea and SOB per patient Nausea and SOB per patient  . Pioglitazone     REACTION: weight gain  . Sulfa Antibiotics Hives    REACTION: bLISTERS  . Sulfonamide Derivatives     REACTION: bLISTERS  . Losartan Swelling  . Sulfasalazine Hives    REACTION: bLISTERS    Level of Care/Admitting Diagnosis ED Disposition    ED Disposition Condition [facility] Area: [place] [[id number]]  Level of Care: Med-Surg [16]  Covid Evaluation: Asymptomatic Screening Protocol (No Symptoms)  Diagnosis: Hyperkalemia [454098]  Admitting Physician: Loletha Grayer [[id number]]  Attending Physician: Loletha Grayer [phone]  Estimated length of stay: past midnight tomorrow  Certification:: I certify this patient will need inpatient services for at least 2 midnights       B Medical/Surgery History Past Medical History:  Diagnosis Date  . Adenomatous colon polyp   . Allergic rhinitis   . Asthma   . COPD (chronic obstructive pulmonary disease) ([place])   . Diabetes mellitus type II   . Diverticulosis of colon   . Esophageal stricture   . Fatty liver   .  Gastritis   . GERD (gastroesophageal reflux disease)   . Gout   . History of chickenpox   . History of CVA (cerebrovascular accident) 2004  . Hyperlipidemia   . Hypertension   . Internal hemorrhoids   . Kidney tumor 11/2012  . Loss of smell 2004   due to CVA  . Periumbilical hernia   . Renal insufficiency   . Umbilical hernia    Past Surgical History:  Procedure Laterality Date  . ABDOMINAL HYSTERECTOMY    . ABDOMINAL HYSTERECTOMY    . APPENDECTOMY    . CESAREAN SECTION     x 2  . COLONOSCOPY WITH PROPOFOL N/A 07/23/2015   Procedure: COLONOSCOPY WITH PROPOFOL;  Surgeon: Manya Silvas, MD;  Location[facility] ENDOSCOPY;  Service: Endoscopy;  Laterality: N/A;  . ESOPHAGOGASTRODUODENOSCOPY (EGD) WITH PROPOFOL N/A 07/23/2015   Procedure: ESOPHAGOGASTRODUODENOSCOPY (EGD) WITH PROPOFOL;  Surgeon: Manya Silvas, MD;  Location: [facility] ENDOSCOPY;  Service: Endoscopy;  Laterality: N/A;  . HEMORROIDECTOMY       A IV Location/Drains/Wounds Patient Lines/Drains/Airways Status   Active Line/Drains/Airways    Name:   Placement date:   Placement time:   Site:   Days:   Peripheral IV 01/28/19 Left Antecubital   01/28/19    1405    Antecubital   1          Intake/Output Last 24 hours  Intake/Output Summary (Last 24 hours) at 01/29/2019 0016 Last data filed at  01/28/2019 1921 Gross per 24 hour  Intake 1050 ml  Output --  Net 1050 ml    Labs/Imaging Results for orders placed or performed during the hospital encounter of 01/28/19 (from the past 48 hour(s))  CBC     Status: Abnormal   Collection Time: 01/28/19  2:01 PM  Result Value Ref Range   WBC 11.3 (H) 4.0 - 10.5 K/uL   RBC 4.15 3.87 - 5.11 MIL/uL   Hemoglobin 12.1 12.0 - 15.0 g/dL   HCT 37.6 36.0 - 46.0 %   MCV 90.6 80.0 - 100.0 fL   MCH 29.2 26.0 - 34.0 pg   MCHC 32.2 30.0 - 36.0 g/dL   RDW 14.6 11.5 - 15.5 %   Platelets 276 150 - 400 K/uL   nRBC 0.0 0.0 - 0.2 %    Comment: Performed at [facility], [address], [place], [place] [address]  Troponin I (High Sensitivity)     Status: None   Collection Time: 01/28/19  2:01 PM  Result Value Ref Range   Troponin I (High Sensitivity) 4 <18 ng/L    Comment: (NOTE) Elevated high sensitivity troponin I (hsTnI) values and significant  changes across serial measurements may suggest ACS but many other  chronic and acute conditions are known to elevate hsTnI results.  Refer to the "Links" section for chest pain algorithms and additional  guidance. Performed at [facility], [address] [place], [place] [address]   Basic metabolic panel     Status: Abnormal   Collection Time: 01/28/19  2:01 PM  Result Value Ref Range   Sodium 132 (L) 135 - 145 mmol/L   Potassium 6.6 (HH) 3.5 - 5.1 mmol/L    Comment: HEMOLYSIS AT THIS LEVEL MAY AFFECT RESULT CRITICAL RESULT CALLED TO, READ BACK BY AND VERIFIED WITH SUSAN NEAL @1439  01/28/19 MJU    Chloride 103 98 - 111 mmol/L   CO2 18 (L) 22 - 32 mmol/L   Glucose, Bld 191 (H) 70 - 99 mg/dL   BUN 82 (H) 8 - 23 mg/dL   Creatinine, Ser 4.40 (H) 0.44 - 1.00 mg/dL   Calcium 9.4 8.9 - 10.3 mg/dL   GFR calc non Af Amer 9 (L) >60 mL/min   GFR calc Af Amer 10 (L) >60 mL/min   Anion gap 11 5 - 15    Comment: Performed at [facility], [address] [place], [place] [address]  Brain natriuretic peptide     Status: None   Collection Time: 01/28/19  2:01 PM  Result Value Ref Range   B Natriuretic Peptide 53.0 0.0 - 100.0 pg/mL    Comment: Performed at [facility], [address] [place],  [address]  Hepatic function panel     Status: Abnormal   Collection Time: 01/28/19  2:06 PM  Result Value Ref Range   Total Protein 7.4 6.5 - 8.1 g/dL   Albumin 3.5 3.5 - 5.0 g/dL   AST 20 15 - 41 U/L    Comment: HEMOLYSIS AT THIS LEVEL MAY AFFECT RESULT   ALT 14 0 - 44 U/L   Alkaline Phosphatase 64 38 - 126 U/L   Total Bilirubin 1.0 0.3 - 1.2 mg/dL    Comment: HEMOLYSIS AT THIS LEVEL MAY  AFFECT RESULT   Bilirubin, Direct 0.3 (H) 0.0 - 0.2 mg/dL    Comment: HEMOLYSIS AT THIS LEVEL MAY AFFECT RESULT   Indirect Bilirubin 0.7 0.3 - 0.9 mg/dL    Comment:  Performed at [facility], [address] [place], [place] [address]  Lipase, blood     Status: None   Collection Time: 01/28/19  2:06 PM  Result Value Ref Range   Lipase 44 11 - 51 U/L    Comment: Performed at [facility], [address] [place], [place] [address]  Respiratory Panel by RT PCR (Flu A&B, Covid) - Nasopharyngeal Swab     Status: None   Collection Time: 01/28/19  4:18 PM   Specimen: Nasopharyngeal Swab  Result Value Ref Range   SARS Coronavirus 2 by RT PCR NEGATIVE NEGATIVE    Comment: (NOTE) SARS-CoV-2 target nucleic acids are NOT DETECTED. The SARS-CoV-2 RNA is generally detectable in upper respiratoy specimens during the acute phase of infection. The lowest concentration of SARS-CoV-2 viral copies this assay can detect is 131 copies/mL. A negative result does not preclude SARS-Cov-2 infection and should not be used as the sole basis for treatment or other patient management decisions. A negative result may occur with  improper specimen collection/handling, submission of specimen other than nasopharyngeal swab, presence of viral mutation(s) within the areas targeted by this assay, and inadequate number of viral copies (<131 copies/mL). A negative result must be combined with clinical observations, patient history, and epidemiological information. The expected result is Negative. Fact Sheet for Patients:  PinkCheek.be Fact Sheet for Healthcare Providers:  GravelBags.it This test is not yet ap proved or cleared by the [place] FDA and  has been authorized for detection and/or diagnosis of SARS-CoV-2 by FDA under an Emergency Use Authorization (EUA). This EUA will remain  in effect (meaning this test can be used) for the  duration of the COVID-19 declaration under Section 564(b)(1) of the Act, 21 U.S.C. section 360bbb-3(b)(1), unless the authorization is terminated or revoked sooner.    Influenza A by PCR NEGATIVE NEGATIVE   Influenza B by PCR NEGATIVE NEGATIVE    Comment: (NOTE) The Xpert Xpress SARS-CoV-2/FLU/RSV assay is intended as an aid in  the diagnosis of influenza from Nasopharyngeal swab specimens and  should not be used as a sole basis for treatment. Nasal washings and  aspirates are unacceptable for Xpert Xpress SARS-CoV-2/FLU/RSV  testing. Fact Sheet for Patients: PinkCheek.be Fact Sheet for Healthcare Providers: GravelBags.it This test is not yet approved or cleared by the [place] FDA and  has been authorized for detection and/or diagnosis of SARS-CoV-2 by  FDA under an Emergency Use Authorization (EUA). This EUA will remain  in effect (meaning this test can be used) for the duration of the  Covid-19 declaration under Section 564(b)(1) of the Act, 21  U.S.C. section 360bbb-3(b)(1), unless the authorization is  terminated or revoked. Performed at [facility], [place], [place] [address]   POC SARS Coronavirus 2 Ag     Status: None   Collection Time: 01/28/19  4:44 PM  Result Value Ref Range   SARS Coronavirus 2 Ag NEGATIVE NEGATIVE    Comment: (NOTE) SARS-CoV-2 antigen NOT DETECTED.  Negative results are presumptive.  Negative results do not preclude SARS-CoV-2 infection and should not be used as the sole basis for treatment or other patient management decisions, including infection  control decisions, particularly in the presence of clinical signs and  symptoms consistent with COVID-19, or in those who have been in contact with the virus.  Negative results must be combined with clinical observations, patient history, and epidemiological information. The expected result is Negative. Fact Sheet  for Patients: PodPark.tn Fact  Sheet for Healthcare Providers: GiftContent.is This test is not yet approved or cleared by the [place] FDA and  has been authorized for detection and/or diagnosis of SARS-CoV-2 by FDA under an Emergency Use Authorization (EUA).  This EUA will remain in effect (meaning this test can be used) for the duration of  the COVID-19 de claration under Section 564(b)(1) of the Act, 21 U.S.C. section 360bbb-3(b)(1), unless the authorization is terminated or revoked sooner.   Urinalysis, Complete w Microscopic     Status: Abnormal   Collection Time: 01/28/19  6:36 PM  Result Value Ref Range   Color, Urine YELLOW (A) YELLOW   APPearance HAZY (A) CLEAR   Specific Gravity, Urine 1.009 1.005 - 1.030   pH 5.0 5.0 - 8.0   Glucose, UA NEGATIVE NEGATIVE mg/dL   Hgb urine dipstick NEGATIVE NEGATIVE   Bilirubin Urine NEGATIVE NEGATIVE   Ketones, ur NEGATIVE NEGATIVE mg/dL   Protein, ur 100 (A) NEGATIVE mg/dL   Nitrite NEGATIVE NEGATIVE   Leukocytes,Ua NEGATIVE NEGATIVE   RBC / HPF 0-5 0 - 5 RBC/hpf   WBC, UA 0-5 0 - 5 WBC/hpf   Bacteria, UA NONE SEEN NONE SEEN   Squamous Epithelial / LPF 0-5 0 - 5    Comment: Performed at [facility], [place], [place] [address]  Troponin I (High Sensitivity)     Status: None   Collection Time: 01/28/19  8:03 PM  Result Value Ref Range   Troponin I (High Sensitivity) 5 <18 ng/L    Comment: (NOTE) Elevated high sensitivity troponin I (hsTnI) values and significant  changes across serial measurements may suggest ACS but many other  chronic and acute conditions are known to elevate hsTnI results.  Refer to the "Links" section for chest pain algorithms and additional  guidance. Performed at [facility], [address] [place], [place] [address]   CK     Status: None   Collection Time: 01/28/19  8:03 PM  Result Value Ref Range   Total  CK 74 38 - 234 U/L    Comment: Performed at [facility], [address] [place], [place] [address]  Basic metabolic panel     Status: Abnormal   Collection Time: 01/28/19  8:03 PM  Result Value Ref Range   Sodium 135 135 - 145 mmol/L   Potassium 5.9 (H) 3.5 - 5.1 mmol/L   Chloride 106 98 - 111 mmol/L   CO2 19 (L) 22 - 32 mmol/L   Glucose, Bld 271 (H) 70 - 99 mg/dL   BUN 81 (H) 8 - 23 mg/dL   Creatinine, Ser 4.13 (H) 0.44 - 1.00 mg/dL   Calcium 8.7 (L) 8.9 - 10.3 mg/dL   GFR calc non Af Amer 10 (L) >60 mL/min   GFR calc Af Amer 11 (L) >60 mL/min   Anion gap 10 5 - 15    Comment: Performed at [facility], [address] [place], [place] [address]   CT HEAD WO CONTRAST  Result Date: 01/28/2019 CLINICAL DATA:  Headache EXAM: CT HEAD WITHOUT CONTRAST TECHNIQUE: Contiguous axial images were obtained from the base of the skull through the vertex without intravenous contrast. COMPARISON:  CT head dated 11/22/2013. FINDINGS: Brain: No evidence of acute infarction, hemorrhage, hydrocephalus, extra-axial collection or mass lesion/mass effect. There is a chronic infarct of the left corona radiata. Periventricular white matter hypoattenuation likely represents chronic small vessel ischemic disease. Vascular: There are vascular calcifications in the carotid siphons.  Skull: Normal. Negative for fracture or focal lesion. Sinuses/Orbits: No acute finding. Other: None. IMPRESSION: 1. No acute intracranial process. Electronically Signed   By: Zerita Boers M.D.   On: 01/28/2019 16:21   DG Chest Port 1 View  Result Date: 01/28/2019 CLINICAL DATA:  Shortness of breath. EXAM: PORTABLE CHEST 1 VIEW COMPARISON:  December 29, 2017 FINDINGS: The heart size is mildly enlarged but stable. The hila and mediastinum are normal. No pneumothorax. No nodules or masses. No focal infiltrates or overt edema. IMPRESSION: No active disease. Electronically Signed   By: Dorise Bullion III M.D   On:  01/28/2019 16:17    Pending Labs Unresulted Labs (From admission, onward)    Start     Ordered   01/29/19 8786  Basic metabolic panel  Tomorrow morning,   STAT     01/28/19 1525   01/29/19 0500  CBC  Tomorrow morning,   STAT     01/28/19 1525          Vitals/Pain Today's Vitals   01/28/19 1630 01/28/19 1930 01/28/19 2200 01/28/19 2230  BP: (!) 149/64 140/63 (!) 135/49 (!) 132/53  Pulse: (!) 57 (!) 58 (!) 51 (!) 50  Resp: 14 16 17 15   Temp:      TempSrc:      SpO2:  97% 97% 99%  Weight:      Height:      PainSc:  0-No pain      Isolation Precautions No active isolations  Medications Medications  sodium zirconium cyclosilicate (LOKELMA) packet 10 g (10 g Oral Given 01/28/19 1706)  heparin injection 5,000 Units (5,000 Units Subcutaneous Given 01/28/19 1710)  acetaminophen (TYLENOL) tablet 650 mg (has no administration in time range)    Or  acetaminophen (TYLENOL) suppository 650 mg (has no administration in time range)  ondansetron (ZOFRAN) tablet 4 mg (has no administration in time range)    Or  ondansetron (ZOFRAN) injection 4 mg (has no administration in time range)  allopurinol (ZYLOPRIM) tablet 50 mg (has no administration in time range)  amLODipine (NORVASC) tablet 10 mg (10 mg Oral Given 01/28/19 1707)  atorvastatin (LIPITOR) tablet 40 mg (40 mg Oral Given 01/28/19 1706)  zolpidem (AMBIEN) tablet 5 mg (has no administration in time range)  calcitRIOL (ROCALTROL) capsule 0.25 mcg (has no administration in time range)  pantoprazole (PROTONIX) EC tablet 40 mg (has no administration in time range)  clopidogrel (PLAVIX) tablet 75 mg (75 mg Oral Given 01/28/19 1706)  albuterol (VENTOLIN HFA) 108 (90 Base) MCG/ACT inhaler 2 puff (has no administration in time range)  umeclidinium-vilanterol (ANORO ELLIPTA) 62.5-25 MCG/INH 1 puff (1 puff Inhalation Given 01/28/19 1719)  0.9 %  sodium chloride infusion ( Intravenous New Bag/Given 01/28/19 2007)  sodium chloride 0.9 % bolus  1,000 mL (0 mLs Intravenous Stopped 01/28/19 1921)  ondansetron (ZOFRAN) injection 4 mg (4 mg Intravenous Given 01/28/19 1534)  dextrose 50 % solution 25 g (25 g Intravenous Given 01/28/19 1537)  insulin aspart (novoLOG) injection 10 Units (10 Units Intravenous Given 01/28/19 1537)  calcium gluconate 1 g/ 50 mL sodium chloride IVPB (0 g Intravenous Stopped 01/28/19 1648)  sodium bicarbonate injection 50 mEq (50 mEq Intravenous Given 01/28/19 1713)    Mobility walks Low fall risk   Focused Assessments Cardiac Assessment Handoff:  Cardiac Rhythm: Sinus bradycardia Lab Results  Component Value Date   CKTOTAL 74 01/28/2019   CKMB 2.5 03/17/2012   TROPONINI <0.03 11/23/2017   Lab Results  Component Value Date  DDIMER 0.92 (H) 12/26/2017   Does the Patient currently have chest pain? No     R Recommendations: See Admitting Provider Note  Report given to:  Candice RN on Same Day Surgery   Additional Notes:

## 2019-01-29 NOTE — Progress Notes (Signed)
Ch met with patient upon referral from Nurse about completion of an AD. Pt let Ch know that she has already completed an AD, and would actually like prayer. Pt shared with Ch many instances of having overcome many illnesses, but was a bit concerned about the upcoming dialysis. Ch listened to Pt's concerns, prayed with Pt, and left.    01/29/19 1841  Clinical Encounter Type  Visited With Patient  Visit Type Initial  Referral From Nurse  Consult/Referral To Chaplain  Spiritual Encounters  Spiritual Needs Prayer  Stress Factors  Patient Stress Factors Health changes  Advance Directives (For Healthcare)  Does Patient Have a Medical Advance Directive? Yes  Does patient want to make changes to medical advance directive? No - Patient declined

## 2019-01-29 NOTE — Progress Notes (Signed)
[facility]  ROUNDING NOTE   Subjective:   Daughter at bedside.   Patient with abdominal pain, intermittent nausea and poor appetite.   Feels better with IV fluids. However states she has been weak for several weeks.   Objective:  Vital signs in last 24 hours:  Temp:  [95.9 F (35.5 C)-97.6 F (36.4 C)] 95.9 F (35.5 C) (01/11 1216) Pulse Rate:  [50-59] 59 (01/11 1216) Resp:  [14-19] 19 (01/11 1216) BP: (131-164)/(49-75) 131/53 (01/11 1216) SpO2:  [97 %-100 %] 99 % (01/11 1216) Weight:  [93.9 kg] 93.9 kg (01/11 0100)  Weight change:  Filed Weights   01/28/19 1357 01/29/19 0100  Weight: 93.4 kg 93.9 kg    Intake/Output: I/O last 3 completed shifts: In: 1050 [IV Piggyback:1050] Out: -    Intake/Output this shift:  Total I/O In: 840 [P.O.:840] Out: -   Physical Exam: General: NAD,   Head: Normocephalic, atraumatic. Moist oral mucosal membranes  Eyes: Anicteric, PERRL  Neck: Supple, trachea midline  Lungs:  Clear to auscultation  Heart: bradycardia  Abdomen:  Soft, nontender,   Extremities: trace peripheral edema.  Neurologic: Nonfocal, moving all four extremities  Skin: No lesions  Access: none    Basic Metabolic Panel: Recent Labs  Lab 01/28/19 1401 01/28/19 2003 01/29/19 0530  NA 132* 135 137  K 6.6* 5.9* 5.3*  CL 103 106 107  CO2 18* 19* 20*  GLUCOSE 191* 271* 153*  BUN 82* 81* 76*  CREATININE 4.40* 4.13* 3.98*  CALCIUM 9.4 8.7* 9.0    Liver Function Tests: Recent Labs  Lab 01/28/19 1406  AST 20  ALT 14  ALKPHOS 64  BILITOT 1.0  PROT 7.4  ALBUMIN 3.5   Recent Labs  Lab 01/28/19 1406  LIPASE 44   No results for input(s): AMMONIA in the last 168 hours.  CBC: Recent Labs  Lab 01/28/19 1401 01/29/19 0530  WBC 11.3* 7.4  HGB 12.1 11.1*  HCT 37.6 34.5*  MCV 90.6 90.1  PLT 276 232    Cardiac Enzymes: Recent Labs  Lab 01/28/19 2003  CKTOTAL 74    BNP: Invalid input(s): POCBNP  CBG: Recent Labs  Lab  01/29/19 0105 01/29/19 0835 01/29/19 1204  GLUCAP 107* 108* 247*    Microbiology: Results for orders placed or performed during the hospital encounter of 01/28/19  Respiratory Panel by RT PCR (Flu A&B, Covid) - Nasopharyngeal Swab     Status: None   Collection Time: 01/28/19  4:18 PM   Specimen: Nasopharyngeal Swab  Result Value Ref Range Status   SARS Coronavirus 2 by RT PCR NEGATIVE NEGATIVE Final    Comment: (NOTE) SARS-CoV-2 target nucleic acids are NOT DETECTED. The SARS-CoV-2 RNA is generally detectable in upper respiratoy specimens during the acute phase of infection. The lowest concentration of SARS-CoV-2 viral copies this assay can detect is 131 copies/mL. A negative result does not preclude SARS-Cov-2 infection and should not be used as the sole basis for treatment or other patient management decisions. A negative result may occur with  improper specimen collection/handling, submission of specimen other than nasopharyngeal swab, presence of viral mutation(s) within the areas targeted by this assay, and inadequate number of viral copies (<131 copies/mL). A negative result must be combined with clinical observations, patient history, and epidemiological information. The expected result is Negative. Fact Sheet for Patients:  PinkCheek.be Fact Sheet for Healthcare Providers:  GravelBags.it This test is not yet ap proved or cleared by the [place] FDA and  has  been authorized for detection and/or diagnosis of SARS-CoV-2 by FDA under an Emergency Use Authorization (EUA). This EUA will remain  in effect (meaning this test can be used) for the duration of the COVID-19 declaration under Section 564(b)(1) of the Act, 21 U.S.C. section 360bbb-3(b)(1), unless the authorization is terminated or revoked sooner.    Influenza A by PCR NEGATIVE NEGATIVE Final   Influenza B by PCR NEGATIVE NEGATIVE Final    Comment:  (NOTE) The Xpert Xpress SARS-CoV-2/FLU/RSV assay is intended as an aid in  the diagnosis of influenza from Nasopharyngeal swab specimens and  should not be used as a sole basis for treatment. Nasal washings and  aspirates are unacceptable for Xpert Xpress SARS-CoV-2/FLU/RSV  testing. Fact Sheet for Patients: PinkCheek.be Fact Sheet for Healthcare Providers: GravelBags.it This test is not yet approved or cleared by the [place] FDA and  has been authorized for detection and/or diagnosis of SARS-CoV-2 by  FDA under an Emergency Use Authorization (EUA). This EUA will remain  in effect (meaning this test can be used) for the duration of the  Covid-19 declaration under Section 564(b)(1) of the Act, 21  U.S.C. section 360bbb-3(b)(1), unless the authorization is  terminated or revoked. Performed at [facility], [address], [place] [address]     Coagulation Studies: No results for input(s): LABPROT, INR in the last 72 hours.  Urinalysis: Recent Labs    01/28/19 1836  COLORURINE YELLOW*  LABSPEC 1.009  PHURINE 5.0  GLUCOSEU NEGATIVE  HGBUR NEGATIVE  BILIRUBINUR NEGATIVE  KETONESUR NEGATIVE  PROTEINUR 100*  NITRITE NEGATIVE  LEUKOCYTESUR NEGATIVE      Imaging: CT HEAD WO CONTRAST  Result Date: 01/28/2019 CLINICAL DATA:  Headache EXAM: CT HEAD WITHOUT CONTRAST TECHNIQUE: Contiguous axial images were obtained from the base of the skull through the vertex without intravenous contrast. COMPARISON:  CT head dated 11/22/2013. FINDINGS: Brain: No evidence of acute infarction, hemorrhage, hydrocephalus, extra-axial collection or mass lesion/mass effect. There is a chronic infarct of the left corona radiata. Periventricular white matter hypoattenuation likely represents chronic small vessel ischemic disease. Vascular: There are vascular calcifications in the carotid siphons. Skull: Normal. Negative for  fracture or focal lesion. Sinuses/Orbits: No acute finding. Other: None. IMPRESSION: 1. No acute intracranial process. Electronically Signed   By: Zerita Boers M.D.   On: 01/28/2019 16:21   DG Chest Port 1 View  Result Date: 01/28/2019 CLINICAL DATA:  Shortness of breath. EXAM: PORTABLE CHEST 1 VIEW COMPARISON:  December 29, 2017 FINDINGS: The heart size is mildly enlarged but stable. The hila and mediastinum are normal. No pneumothorax. No nodules or masses. No focal infiltrates or overt edema. IMPRESSION: No active disease. Electronically Signed   By: Dorise Bullion III M.D   On: 01/28/2019 16:17     Medications:   . sodium bicarbonate 150 mEq in dextrose 5% 1000 mL 150 mEq (01/29/19 1156)   . allopurinol  50 mg Oral Daily  . amLODipine  10 mg Oral Daily  . atorvastatin  40 mg Oral Daily  . calcitRIOL  0.25 mcg Oral Daily  . clopidogrel  75 mg Oral Daily  . heparin  5,000 Units Subcutaneous Q8H  . insulin aspart  0-15 Units Subcutaneous TID WC  . insulin aspart  0-5 Units Subcutaneous QHS  . pantoprazole  40 mg Oral Daily  . sodium zirconium cyclosilicate  10 g Oral BID  . umeclidinium-vilanterol  1 puff Inhalation Daily   acetaminophen **OR** acetaminophen, albuterol, ondansetron **OR** ondansetron (  ZOFRAN) IV, zolpidem  Assessment/ Plan:  Ms. Megan [name] is a 79 y.o. black female with diabetes mellitus type II insulin dependent, hypertension, hyperlipidemia, renal cell carcinoma status post right nephrectomy, CVA, gout, COPD, and GERD  1. 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  Secondary to diabetic nephropathy and partial nephrectomy This is progression of disease and will most likely benefit from initiating dialysis.  - Consult vascular for peritoneal dialysis placement.  - Low potassium diet - [place] - Dialysis education - Continue IV fluids  2. Hypertension: holding bisoprolol lisinopril,  furosemide and spironolactone. Currently blood pressure at goal   3. Secondary Hyperparathyroidism - Check PTH, and phosphorus  4. Anemia of chronic kidney disease: hemoglobin 11.1    LOS: 1 Teyonna Plaisted 1/11/20213:42 PM

## 2019-01-30 ENCOUNTER — Inpatient Hospital Stay: Payer: Medicare HMO

## 2019-01-30 DIAGNOSIS — R001 Bradycardia, unspecified: Secondary | ICD-10-CM

## 2019-01-30 DIAGNOSIS — N186 End stage renal disease: Secondary | ICD-10-CM

## 2019-01-30 DIAGNOSIS — R05 Cough: Secondary | ICD-10-CM

## 2019-01-30 DIAGNOSIS — E875 Hyperkalemia: Principal | ICD-10-CM

## 2019-01-30 DIAGNOSIS — R1013 Epigastric pain: Secondary | ICD-10-CM

## 2019-01-30 DIAGNOSIS — R531 Weakness: Secondary | ICD-10-CM

## 2019-01-30 LAB — BASIC METABOLIC PANEL
Anion gap: 10 (ref 5–15)
BUN: 63 mg/dL — ABNORMAL HIGH (ref 8–23)
CO2: 25 mmol/L (ref 22–32)
Calcium: 8.5 mg/dL — ABNORMAL LOW (ref 8.9–10.3)
Chloride: 105 mmol/L (ref 98–111)
Creatinine, Ser: 3.34 mg/dL — ABNORMAL HIGH (ref 0.44–1.00)
GFR calc Af Amer: 14 mL/min — ABNORMAL LOW (ref >60)
GFR calc non Af Amer: 12 mL/min — ABNORMAL LOW (ref >60)
Glucose, Bld: 168 mg/dL — ABNORMAL HIGH (ref 70–99)
Potassium: 4.3 mmol/L (ref 3.5–5.1)
Sodium: 140 mmol/L (ref 135–145)

## 2019-01-30 LAB — GLUCOSE, CAPILLARY
Glucose-Capillary: 110 mg/dL — ABNORMAL HIGH (ref 70–99)
Glucose-Capillary: 140 mg/dL — ABNORMAL HIGH (ref 70–99)
Glucose-Capillary: 182 mg/dL — ABNORMAL HIGH (ref 70–99)
Glucose-Capillary: 208 mg/dL — ABNORMAL HIGH (ref 70–99)
Glucose-Capillary: 242 mg/dL — ABNORMAL HIGH (ref 70–99)

## 2019-01-30 LAB — HEPATITIS B SURFACE ANTIGEN: Hepatitis B Surface Ag: NONREACTIVE

## 2019-01-30 LAB — HEPATITIS B SURFACE ANTIBODY,QUALITATIVE: Hep B S Ab: NONREACTIVE

## 2019-01-30 LAB — HEPATITIS B CORE ANTIBODY, IGM: Hep B C IgM: NONREACTIVE

## 2019-01-30 LAB — HEPATITIS C ANTIBODY: HCV Ab: NONREACTIVE

## 2019-01-30 MED ORDER — CHLORHEXIDINE GLUCONATE CLOTH 2 % EX PADS
6.0000 | MEDICATED_PAD | Freq: Every day | CUTANEOUS | Status: DC
  Administered 2019-01-30 – 2019-01-31 (×2): 6 via TOPICAL

## 2019-01-30 MED ORDER — HEPARIN SODIUM (PORCINE) 5000 UNIT/ML IJ SOLN
INTRAMUSCULAR | Status: AC
  Administered 2019-01-30: 5000 [IU] via SUBCUTANEOUS
  Filled 2019-01-30: qty 1

## 2019-01-30 MED ORDER — INSULIN ASPART 100 UNIT/ML ~~LOC~~ SOLN
SUBCUTANEOUS | Status: AC
  Filled 2019-01-30: qty 1

## 2019-01-30 MED ORDER — INSULIN ASPART 100 UNIT/ML ~~LOC~~ SOLN
SUBCUTANEOUS | Status: AC
  Administered 2019-01-30: 3 [IU] via SUBCUTANEOUS
  Filled 2019-01-30: qty 1

## 2019-01-30 MED ORDER — HEPARIN SODIUM (PORCINE) 5000 UNIT/ML IJ SOLN
INTRAMUSCULAR | Status: AC
  Administered 2019-01-30: 10:00:00 5000 [IU] via SUBCUTANEOUS
  Filled 2019-01-30: qty 1

## 2019-01-30 MED ORDER — SODIUM CHLORIDE 0.9 % IV SOLN
INTRAVENOUS | Status: DC
  Administered 2019-01-31: 09:00:00 50 mL/h via INTRAVENOUS

## 2019-01-30 MED ORDER — CEFAZOLIN SODIUM-DEXTROSE 1-4 GM/50ML-% IV SOLN
1.0000 g | INTRAVENOUS | Status: AC
  Administered 2019-01-31: 15:00:00 2 g via INTRAVENOUS

## 2019-01-30 NOTE — Plan of Care (Signed)
Pt. Partipates in recovery efforts.

## 2019-01-30 NOTE — TOC Initial Note (Signed)
Transition of Care [facility]) - Initial/Assessment Note    Patient Details  Name: Megan [name] MRN: [id number] Date of Birth: 01-Aug-1940  [facility] CM/SW Contact:    Shelbie Ammons, RN Phone Number: 01/30/2019, 4:11 PM  Clinical Narrative:     RNCM completed assessment at bedside. Patient is alert and pleasant talking on the phone on arrival. Patient reports to feeling well today and is anxious to get her surgery completed but is not anxious to start Dialysis. Patient reports to living in home with husband 2 dogs and is very talkative about her dogs. Patient reports to being independent at home and plans to return to this independence. Patient reports she does not use any equipment and does not for see needing any. Patient reports she still drives and has no issues getting medications or going to MD appointments.   RNCM will continue to follow for any needs.         Expected Discharge Plan: Home/Self Care Barriers to Discharge: No Barriers Identified   Patient Goals and CMS Choice Patient states their goals for this hospitalization and ongoing recovery are:: I want to get this surgery done and get back home.      Expected Discharge Plan and Services Expected Discharge Plan: Home/Self Care   Discharge Planning Services: CM Consult   Living arrangements for the past 2 months: [place]                                      Prior Living Arrangements/Services Living arrangements for the past 2 months: [place] Lives with:: Pets, Spouse Patient language and need for interpreter reviewed:: Yes Do you feel safe going back to the place where you live?: Yes      Need for Family Participation in Patient Care: Yes (Comment) Care giver support system in place?: Yes (comment)   Criminal Activity/Legal Involvement Pertinent to Current Situation/Hospitalization: No - Comment as needed  Activities of Daily Living Home Assistive  Devices/Equipment: Shower chair without back, Walker (specify type), CBG Meter, Eyeglasses ADL Screening (condition at time of admission) Patient's cognitive ability adequate to safely complete daily activities?: Yes Is the patient deaf or have difficulty hearing?: No Does the patient have difficulty seeing, even when wearing glasses/contacts?: No Does the patient have difficulty concentrating, remembering, or making decisions?: No Patient able to express need for assistance with ADLs?: Yes Does the patient have difficulty dressing or bathing?: No Independently performs ADLs?: Yes (appropriate for developmental age) Does the patient have difficulty walking or climbing stairs?: Yes Weakness of Legs: Both Weakness of Arms/Hands: Right  Permission Sought/Granted                  Emotional Assessment Appearance:: Appears stated age Attitude/Demeanor/Rapport: Engaged Affect (typically observed): Pleasant, Appropriate Orientation: : Oriented to Self, Oriented to Place, Oriented to  Time, Oriented to Situation Alcohol / Substance Use: Never Used Psych Involvement: No (comment)  Admission diagnosis:  Cough [R05] Hyperkalemia [E87.5] Bradycardia [R00.1] Weakness [R53.1] Patient Active Problem List   Diagnosis Date Noted  . Hyperkalemia 01/28/2019  . Bradycardia   . CKD stage 4 due to type 2 diabetes mellitus ([place])   . Lung nodule < 6cm on CT 10/07/2018  . Dyspnea on exertion 01/16/2018  . Obesity (BMI 30-39.9) 07/04/2017  . Stroke ([place]) 07/04/2017  . Trochanteric bursitis of right hip 02/01/2017  . Type 2  diabetes mellitus with diabetic nephropathy, with long-term current use of insulin ([place]) 07/15/2015  . Otitis, externa, infective 03/26/2014  . Hamstring injury 05/10/2013  . Acute kidney injury superimposed on CKD ([place]) 02/10/2013  . Essential hypertension 02/10/2013  . Postoperative anemia due to acute blood loss 02/09/2013  . Malignant neoplasm of kidney excluding renal  pelvis ([place]) 02/08/2013  . Renal mass, left 12/22/2012  . Myalgia and myositis 06/16/2012  . Unspecified vitamin D deficiency 06/16/2012  . Fatty liver disease, nonalcoholic 49/67/5916  . Family hx of colon cancer 06/14/2012  . Hx of adenomatous colonic polyps 06/14/2012  . Neck pain on right side 05/03/2012  . Coronary atherosclerosis of native coronary artery 03/17/2012  . Edema 03/17/2012  . Hx of surgical procedure 12/19/2011  . Hyperlipidemia due to type 2 diabetes mellitus ([place]) 12/14/2011  . Other screening mammogram 11/05/2010  . 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 Diabetes mellitus type II, uncontrolled ([place]) 07/31/2010  . 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  . UTI 12/24/2009  . HYPERLIPIDEMIA 10/30/2009  . Chronic renal insufficiency, stage III (moderate) 10/30/2009  . GERD 08/20/2008  . ESOPHAGEAL STRICTURE 03/29/2008  . Seasonal and perennial allergic rhinitis 01/18/2007  . GOUT 08/06/2006  . Hypertension, renal disease 08/06/2006  . Asthma-COPD overlap syndrome ([place]) 08/06/2006  . CEREBROVASCULAR ACCIDENT, HX OF 08/06/2006   PCP:  Tracie Harrier, MD Pharmacy:   [facility] [address], [place] [address] [place] [address] Phone: [phone] Fax: [phone]     Social Determinants of Health (SDOH) Interventions    Readmission Risk Interventions Readmission Risk Prevention Plan 01/30/2019  Transportation Screening Complete  PCP or Specialist Appt within 3-5 Days Complete  Palliative Care Screening Not Applicable  Medication Review (RN Care Manager) Complete  Some recent data might be hidden

## 2019-01-30 NOTE — OR Nursing (Signed)
Pt. Returned from ultrasound. IV infiltrated. IV stopped and IV d/c and warm compress applied.

## 2019-01-30 NOTE — Progress Notes (Signed)
PROGRESS NOTE                                                                                                                                                                                                             Patient Demographics:    Megan [name], is a 79 y.o. female, DOB - 09/21/1940, HKV:[id number]  Admit date - 01/28/2019   Admitting Physician Loletha Grayer, MD  Outpatient Primary MD for the patient is Tracie Harrier, MD  LOS - 2    Chief Complaint  Patient presents with  . Nausea  . 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  She had an episode of diarrhea and diaphoresis. Her Lasix dose was increased recently due to leg swellings.  In the ED she was hyperkalemic with potassium of 6.6 and creatinine of 4.4.  Also found to have metabolic acidosis.  Given calcium gluconate, IV insulin and sodium bicarb. Admitted for further management.   Subjective:   Reports feeling better and not so weak today.  She is apprehensive about starting dialysis.  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  Associated metabolic acidosis and uremia.  On multiple medication including Lasix, lisinopril, HCTZ and Aldactone all have been held.  Received gentle hydration. Nephrology following and recommends peritoneal dialysis.  Vascular surgery to place laparoscopic peritoneal dialysis catheter on 1/13.   Active problems Hyperkalemia  Received calcium gluconate, bicarb, insulin with dextrose.  Potassium now  stable.  On Lokelma.   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  History of stroke Continue Plavix and statin   Essential hypertension Stable.  Multiple home meds including bisoprolol, amlodipine, lisinopril, Lasix and Aldactone.  Continue amlodipine only..  Beta-blocker held due to bradycardia.  Rest held due to advanced renal failure and hyperkalemia.  Secondary hyperparathyroidism On calcitriol.  Chronic gout Continue Uloric.  Epigastric and bilateral upper quadrant pain and tenderness. LFTs  normal.  Ultrasound abdomen shows hepatic steatosis.  Symptoms currently resolved.  Code Status : Full code  Family Communication  : None  Disposition Plan  : Home possibly next 48 hours after PD catheter placed tomorrow.  Barriers For Discharge : Active symptoms, PD catheter placement on 1/13  Consults  : Nephrology, vascular surgery  Procedures  : CT head, ultrasound abdomen  DVT Prophylaxis  : Subcu heparin  Lab Results  Component Value Date   PLT 232 01/29/2019    Antibiotics  :    Anti-infectives (From admission, onward)   Start     Dose/Rate Route Frequency Ordered Stop   01/31/19 0900  ceFAZolin (ANCEF) IVPB 1 g/50 mL premix    Note to Pharmacy: Send with patient to OR   1 g 100 mL/hr over 30 Minutes Intravenous On call 01/30/19 1245 02/01/19 0900        Objective:   Vitals:   01/30/19 0002 01/30/19 0428 01/30/19 0800 01/30/19 1200  BP: (!) 161/62 (!) 133/53 (!) 145/78 (!) 143/64  Pulse: 63 60 65 (!) 55  Resp: 20 19 17 17   Temp: (!) 97.3 F (36.3 C) 97.6 F (36.4 C) 98.7 F (37.1 C) 97.8 F (36.6 C)  TempSrc: Tympanic Tympanic Tympanic Tympanic  SpO2: 96% 96%  100%  Weight:      Height:        Wt Readings from Last 3 Encounters:  01/29/19 93.9 kg  01/16/18 97.9 kg  12/26/17 101.2 kg     Intake/Output Summary (Last 24 hours) at 01/30/2019 1358 Last data filed at  01/29/2019 1800 Gross per 24 hour  Intake 589.58 ml  Output --  Net 589.58 ml    Physical exam Not in distress HEENT: Moist mucosa, supple neck Chest: Clear CVS: Normal S1-S2 GI: Soft, nondistended, no epigastric tenderness, bowel sounds present Musculoskeletal: Warm, no edema     Data Review:    CBC Recent Labs  Lab 01/28/19 1401 01/29/19 0530  WBC 11.3* 7.4  HGB 12.1 11.1*  HCT 37.6 34.5*  PLT 276 232  MCV 90.6 90.1  MCH 29.2 29.0  MCHC 32.2 32.2  RDW 14.6 14.6    Chemistries  Recent Labs  Lab 01/28/19 1401 01/28/19 1406 01/28/19 2003 01/29/19 0530 01/30/19 0611  NA 132*  --  135 137 140  K 6.6*  --  5.9* 5.3* 4.3  CL 103  --  106 107 105  CO2 18*  --  19* 20* 25  GLUCOSE 191*  --  271* 153* 168*  BUN 82*  --  81* 76* 63*  CREATININE 4.40*  --  4.13* 3.98* 3.34*  CALCIUM 9.4  --  8.7* 9.0 8.5*  AST  --  20  --   --   --   ALT  --  14  --   --   --   ALKPHOS  --  64  --   --   --   BILITOT  --  1.0  --   --   --    ------------------------------------------------------------------------------------------------------------------ No results for input(s): CHOL, HDL, LDLCALC, TRIG, CHOLHDL, LDLDIRECT in the last 72 hours.  Lab Results  Component Value Date   HGBA1C 7.1 (H) 01/29/2019   ------------------------------------------------------------------------------------------------------------------ No results for input(s): TSH, T4TOTAL, T3FREE, THYROIDAB in the last 72 hours.  Invalid input(s): FREET3 ------------------------------------------------------------------------------------------------------------------ No results for input(s): VITAMINB12, FOLATE, FERRITIN, TIBC, IRON, RETICCTPCT in the last 72 hours.  Coagulation profile No results for input(s): INR, PROTIME in the  last 168 hours.  No results for input(s): DDIMER in the last 72 hours.  Cardiac Enzymes No results for input(s): CKMB, TROPONINI, MYOGLOBIN in the last 168  hours.  Invalid input(s): CK ------------------------------------------------------------------------------------------------------------------    Component Value Date/Time   BNP 53.0 01/28/2019 1401    Inpatient Medications  Scheduled Meds: . allopurinol  50 mg Oral Daily  . amLODipine  10 mg Oral Daily  . atorvastatin  40 mg Oral Daily  . calcitRIOL  0.25 mcg Oral Daily  . clopidogrel  75 mg Oral Daily  . heparin  5,000 Units Subcutaneous Q8H  . insulin aspart  0-15 Units Subcutaneous TID WC  . insulin aspart  0-5 Units Subcutaneous QHS  . pantoprazole  40 mg Oral Daily  . sodium zirconium cyclosilicate  10 g Oral BID  . umeclidinium-vilanterol  1 puff Inhalation Daily   Continuous Infusions: . [START ON 01/31/2019]  ceFAZolin (ANCEF) IV    . sodium bicarbonate 150 mEq in dextrose 5% 1000 mL 150 mEq (01/30/19 1146)   PRN Meds:.acetaminophen **OR** acetaminophen, albuterol, ondansetron **OR** ondansetron (ZOFRAN) IV, zolpidem  Micro Results Recent Results (from the past 240 hour(s))  Respiratory Panel by RT PCR (Flu A&B, Covid) - Nasopharyngeal Swab     Status: None   Collection Time: 01/28/19  4:18 PM   Specimen: Nasopharyngeal Swab  Result Value Ref Range Status   SARS Coronavirus 2 by RT PCR NEGATIVE NEGATIVE Final    Comment: (NOTE) SARS-CoV-2 target nucleic acids are NOT DETECTED. The SARS-CoV-2 RNA is generally detectable in upper respiratoy specimens during the acute phase of infection. The lowest concentration of SARS-CoV-2 viral copies this assay can detect is 131 copies/mL. A negative result does not preclude SARS-Cov-2 infection and should not be used as the sole basis for treatment or other patient management decisions. A negative result may occur with  improper specimen collection/handling, submission of specimen other than nasopharyngeal swab, presence of viral mutation(s) within the areas targeted by this assay, and inadequate number of viral  copies (<131 copies/mL). A negative result must be combined with clinical observations, patient history, and epidemiological information. The expected result is Negative. Fact Sheet for Patients:  PinkCheek.be Fact Sheet for Healthcare Providers:  GravelBags.it This test is not yet ap proved or cleared by the [place] FDA and  has been authorized for detection and/or diagnosis of SARS-CoV-2 by FDA under an Emergency Use Authorization (EUA). This EUA will remain  in effect (meaning this test can be used) for the duration of the COVID-19 declaration under Section 564(b)(1) of the Act, 21 U.S.C. section 360bbb-3(b)(1), unless the authorization is terminated or revoked sooner.    Influenza A by PCR NEGATIVE NEGATIVE Final   Influenza B by PCR NEGATIVE NEGATIVE Final    Comment: (NOTE) The Xpert Xpress SARS-CoV-2/FLU/RSV assay is intended as an aid in  the diagnosis of influenza from Nasopharyngeal swab specimens and  should not be used as a sole basis for treatment. Nasal washings and  aspirates are unacceptable for Xpert Xpress SARS-CoV-2/FLU/RSV  testing. Fact Sheet for Patients: PinkCheek.be Fact Sheet for Healthcare Providers: GravelBags.it This test is not yet approved or cleared by the [place] FDA and  has been authorized for detection and/or diagnosis of SARS-CoV-2 by  FDA under an Emergency Use Authorization (EUA). This EUA will remain  in effect (meaning this test can be used) for the duration of the  Covid-19 declaration under Section 564(b)(1) of the Act, 21  U.S.C. section 360bbb-3(b)(1), unless the  authorization is  terminated or revoked. Performed at [facility], [address] [place], [place] [address]     Radiology Reports CT HEAD WO CONTRAST  Result Date: 01/28/2019 CLINICAL DATA:  Headache EXAM: CT HEAD WITHOUT CONTRAST  TECHNIQUE: Contiguous axial images were obtained from the base of the skull through the vertex without intravenous contrast. COMPARISON:  CT head dated 11/22/2013. FINDINGS: Brain: No evidence of acute infarction, hemorrhage, hydrocephalus, extra-axial collection or mass lesion/mass effect. There is a chronic infarct of the left corona radiata. Periventricular white matter hypoattenuation likely represents chronic small vessel ischemic disease. Vascular: There are vascular calcifications in the carotid siphons. Skull: Normal. Negative for fracture or focal lesion. Sinuses/Orbits: No acute finding. Other: None. IMPRESSION: 1. No acute intracranial process. Electronically Signed   By: Zerita Boers M.D.   On: 01/28/2019 16:21   US Abdomen Complete  Result Date: 01/30/2019 CLINICAL DATA:  Epigastric pain. Personal history partial nephrectomy on the left. EXAM: ABDOMEN ULTRASOUND COMPLETE COMPARISON:  Renal ultrasound 09/21/2016. Abdominal ultrasound 08/21/2008. FINDINGS: Gallbladder: No gallstones or wall thickening visualized. No sonographic Murphy sign noted by sonographer. Common bile duct: Diameter: 3.5 mm, within normal limits. Liver: The liver is diffusely echogenic. No discrete lesions are present. Findings are similar the prior study. Portal vein is patent on color Doppler imaging with normal direction of blood flow towards the liver. IVC: No abnormality visualized. Pancreas: Visualized portion unremarkable. Spleen: Size and appearance within normal limits. Right Kidney: Length: 9.8 cm. There is progressive thinning of the renal parenchyma. The parenchyma is diffusely echogenic. No discrete lesions are present. Left Kidney: Length: 8.9 cm. There is progressive thinning of the renal parenchyma. The parenchyma is echogenic. No discrete lesions are present. Abdominal aorta: 2.4 cm, within limits Other findings: None. IMPRESSION: 1. Increased echogenicity of the renal parenchyma. This is nonspecific, but seen  in the setting medical renal disease. 2. Progressive thinning of the renal parenchyma. 3. Hepatic steatosis. 4. No acute or other focal lesion to explain the patient's abdominal pain. Electronically Signed   By: San Morelle M.D.   On: 01/30/2019 09:14   DG Chest Port 1 View  Result Date: 01/28/2019 CLINICAL DATA:  Shortness of breath. EXAM: PORTABLE CHEST 1 VIEW COMPARISON:  December 29, 2017 FINDINGS: The heart size is mildly enlarged but stable. The hila and mediastinum are normal. No pneumothorax. No nodules or masses. No focal infiltrates or overt edema. IMPRESSION: No active disease. Electronically Signed   By: Dorise Bullion III M.D   On: 01/28/2019 16:17    Time Spent in minutes 35   Stefanie Hodgens M.D on 01/30/2019 at 1:58 PM  Between 7am to 7pm - Pager - [phone]  After 7pm go to [email or web address] - password [facility]  [facility] -  Office  [phone]

## 2019-01-30 NOTE — Consult Note (Signed)
[facility] Consult Note  MRN : [id number]  Megan [name] is a 79 y.o. (1940/03/28) female who presents with chief complaint of:   Chief Complaint  Patient presents with  . Nausea  . Shortness of Breath   History of Present Illness:  The patient is a 79 year old female with multiple medical issues (see below) who presented to the [facility] emergency department with chief complaint of progressively worsening shortness of breath and nausea.  Patient resting history of progressively worsening fatigue.  Patient notes intermittent dizziness especially with standing.  Progressively worsening nausea and shortness of breath.  Denies any fever or vomiting.  Denies any chest pain.  Patient with a known history of chronic kidney disease and partial nephrectomy.  Recent increase in Lasix due to worsening swelling of the bilateral legs. In the ER, her potassium was found to be 6.6 and her creatinine was 4.4.   Patient was seen by nephrology he would like to initiate dialysis in the near future.  Patient would like to proceed with peritoneal dialysis and therefore with vascular surgery was consulted by Dr. Juleen China for a peritoneal dialysis catheter placement.  Current Facility-Administered Medications  Medication Dose Route Frequency Provider Last Rate Last Admin  . acetaminophen (TYLENOL) tablet 650 mg  650 mg Oral Q6H PRN Loletha Grayer, MD       Or  . acetaminophen (TYLENOL) suppository 650 mg  650 mg Rectal Q6H PRN Wieting, Richard, MD      . albuterol (VENTOLIN HFA) 108 (90 Base) MCG/ACT inhaler 2 puff  2 puff Inhalation Q6H PRN Loletha Grayer, MD   2 puff at 01/30/19 0020  . allopurinol (ZYLOPRIM) tablet 50 mg  50 mg Oral Daily Loletha Grayer, MD   50 mg at 01/30/19 0949  . amLODipine (NORVASC) tablet 10 mg  10 mg Oral Daily Loletha Grayer, MD   10 mg at 01/30/19 0948  . atorvastatin (LIPITOR) tablet 40 mg  40 mg Oral Daily  Loletha Grayer, MD   40 mg at 01/30/19 0948  . calcitRIOL (ROCALTROL) capsule 0.25 mcg  0.25 mcg Oral Daily Loletha Grayer, MD   0.25 mcg at 01/30/19 0948  . [START ON 01/31/2019] ceFAZolin (ANCEF) IVPB 1 g/50 mL premix  1 g Intravenous On Call Devion Chriscoe A, PA-C      . clopidogrel (PLAVIX) tablet 75 mg  75 mg Oral Daily Loletha Grayer, MD   75 mg at 01/30/19 0947  . heparin injection 5,000 Units  5,000 Units Subcutaneous Q8H Loletha Grayer, MD   5,000 Units at 01/30/19 [phone]  . insulin aspart (novoLOG) injection 0-15 Units  0-15 Units Subcutaneous TID WC Dhungel, Nishant, MD   3 Units at 01/30/19 0815  . insulin aspart (novoLOG) injection 0-5 Units  0-5 Units Subcutaneous QHS Dhungel, Nishant, MD   2 Units at 01/29/19 2258  . ondansetron (ZOFRAN) tablet 4 mg  4 mg Oral Q6H PRN Loletha Grayer, MD       Or  . ondansetron (ZOFRAN) injection 4 mg  4 mg Intravenous Q6H PRN Wieting, Richard, MD      . pantoprazole (PROTONIX) EC tablet 40 mg  40 mg Oral Daily Loletha Grayer, MD   40 mg at 01/30/19 0947  . sodium bicarbonate 150 mEq in dextrose 5% 1000 mL infusion  150 mEq Intravenous Continuous Kolluru, Sarath, MD 50 mL/hr at 01/30/19 1146 150 mEq at 01/30/19 1146  . sodium zirconium cyclosilicate (LOKELMA) packet 10 g  10 g  Oral BID Loletha Grayer, MD   10 g at 01/29/19 2236  . umeclidinium-vilanterol (ANORO ELLIPTA) 62.5-25 MCG/INH 1 puff  1 puff Inhalation Daily Loletha Grayer, MD   1 puff at 01/30/19 0951  . zolpidem (AMBIEN) tablet 5 mg  5 mg Oral QHS PRN Loletha Grayer, MD       Past Medical History:  Diagnosis Date  . Adenomatous colon polyp   . Allergic rhinitis   . Asthma   . Cancer ([place])    kidney  . COPD (chronic obstructive pulmonary disease) ([place])   . Diabetes mellitus type II   . Diverticulosis of colon   . Esophageal stricture   . Fatty liver   . Gastritis   . GERD (gastroesophageal reflux disease)   . Gout   . History of chickenpox   . History of CVA  (cerebrovascular accident) 2004  . Hyperlipidemia   . Hypertension   . Internal hemorrhoids   . Kidney tumor 11/2012  . Loss of smell 2004   due to CVA  . Periumbilical hernia   . Renal insufficiency   . Umbilical hernia    Past Surgical History:  Procedure Laterality Date  . ABDOMINAL HYSTERECTOMY    . ABDOMINAL HYSTERECTOMY    . APPENDECTOMY    . CESAREAN SECTION     x 2  . COLONOSCOPY WITH PROPOFOL N/A 07/23/2015   Procedure: COLONOSCOPY WITH PROPOFOL;  Surgeon: Manya Silvas, MD;  Location: [facility] ENDOSCOPY;  Service: Endoscopy;  Laterality: N/A;  . ESOPHAGOGASTRODUODENOSCOPY (EGD) WITH PROPOFOL N/A 07/23/2015   Procedure: ESOPHAGOGASTRODUODENOSCOPY (EGD) WITH PROPOFOL;  Surgeon: Manya Silvas, MD;  Location: [facility] ENDOSCOPY;  Service: Endoscopy;  Laterality: N/A;  . HEMORROIDECTOMY    . KIDNEY SURGERY     Social History Social History   Tobacco Use  . Smoking status: Former Smoker    Packs/day: 1.00    Years: 30.00    Pack years: 30.00    Types: Cigarettes    Quit date: 01/18/2002    Years since quitting: 17.0  . Smokeless tobacco: Never Used  Substance Use Topics  . Alcohol use: No  . Drug use: No   Family History Family History  Problem Relation Age of Onset  . GER disease Father   . Heart failure Father   . Colon polyps Father   . Ovarian cancer Mother   . Colon cancer Other        aunt  . Diabetes Other   . Colon polyps Other        aunt  . CAD Neg Hx   Denies family history of peripheral artery disease, renal disease or bleeding/clotting disorders.  Allergies  Allergen Reactions  . Crestor [Rosuvastatin]     myopathy  . Iodinated Diagnostic Agents Other (See Comments)    Nausea and SOB per patient Nausea and SOB per patient  . Pioglitazone     REACTION: weight gain  . Sulfa Antibiotics Hives    REACTION: bLISTERS  . Sulfonamide Derivatives     REACTION: bLISTERS  . Losartan Swelling  . Sulfasalazine Hives    REACTION: bLISTERS   REVIEW  OF SYSTEMS (Negative unless checked)  Constitutional: [] Weight loss  [] Fever  [] Chills Cardiac: [] Chest pain   [] Chest pressure   [] Palpitations   [x] Shortness of breath when laying flat   [x] Shortness of breath at rest   [x] Shortness of breath with exertion. Vascular:  [] Pain in legs with walking   [] Pain in legs at rest   [] Pain  in legs when laying flat   [] Claudication   [] Pain in feet when walking  [] Pain in feet at rest  [] Pain in feet when laying flat   [] History of DVT   [] Phlebitis   [x] Swelling in legs   [] Varicose veins   [] Non-healing ulcers Pulmonary:   [] Uses home oxygen   [] Productive cough   [] Hemoptysis   [] Wheeze  [] COPD   [] Asthma Neurologic:  [x] Dizziness  [] Blackouts   [] Seizures   [] History of stroke   [] History of TIA  [] Aphasia   [] Temporary blindness   [] Dysphagia   [] Weakness or numbness in arms   [] Weakness or numbness in legs Musculoskeletal:  [] Arthritis   [] Joint swelling   [] Joint pain   [] Low back pain Hematologic:  [] Easy bruising  [] Easy bleeding   [] Hypercoagulable state   [] Anemic  [] Hepatitis Gastrointestinal:  [] Blood in stool   [] Vomiting blood  [] Gastroesophageal reflux/heartburn   [] Difficulty swallowing. Genitourinary:  [] Chronic kidney disease   [] Difficult urination  [] Frequent urination  [] Burning with urination   [] Blood in urine Skin:  [] Rashes   [] Ulcers   [] Wounds Psychological:  [] History of anxiety   []  History of major depression.  Physical Examination  Vitals:   01/30/19 0002 01/30/19 0428 01/30/19 0800 01/30/19 1200  BP: (!) 161/62 (!) 133/53 (!) 145/78 (!) 143/64  Pulse: 63 60 65 (!) 55  Resp: 20 19 17 17   Temp: (!) 97.3 F (36.3 C) 97.6 F (36.4 C) 98.7 F (37.1 C) 97.8 F (36.6 C)  TempSrc: Tympanic Tympanic Tympanic Tympanic  SpO2: 96% 96%  100%  Weight:      Height:       Body mass index is 36.67 kg/m. Gen:  WD/WN, NAD Head: [place]/AT, No temporalis wasting. Prominent temp pulse not noted. Ear/Nose/Throat: Hearing grossly  intact, nares w/o erythema or drainage, oropharynx w/o Erythema/Exudate Eyes: Sclera non-icteric, conjunctiva clear Neck: Trachea midline.  No JVD.  Pulmonary:  Good air movement, respirations not labored, equal bilaterally.  Cardiac: RRR, normal S1, S2. Vascular:  Vessel Right Left  Radial Palpable Palpable  Ulnar Palpable Palpable  Brachial Palpable Palpable  Carotid Palpable, without bruit Palpable, without bruit  Aorta Not palpable N/A  Femoral Palpable Palpable  Popliteal Palpable Palpable  PT Palpable Palpable  DP Palpable Palpable   Gastrointestinal: soft, non-tender/non-distended. No guarding/reflex.  Musculoskeletal: M/S 5/5 throughout.  Extremities without ischemic changes.  No deformity or atrophy. Moderate edema. Neurologic: Sensation grossly intact in extremities.  Symmetrical.  Speech is fluent. Motor exam as listed above. Psychiatric: Judgment intact, Mood & affect appropriate for pt's clinical situation. Dermatologic: No rashes or ulcers noted.  No cellulitis or open wounds. Lymph : No Cervical, Axillary, or Inguinal lymphadenopathy.  CBC Lab Results  Component Value Date   WBC 7.4 01/29/2019   HGB 11.1 (L) 01/29/2019   HCT 34.5 (L) 01/29/2019   MCV 90.1 01/29/2019   PLT 232 01/29/2019   BMET    Component Value Date/Time   NA 140 01/30/2019 0611   NA 142 11/23/2013 0358   K 4.3 01/30/2019 0611   K 4.4 11/23/2013 0358   K 4.6 10/01/2010 0000   CL 105 01/30/2019 0611   CL 108 (H) 11/23/2013 0358   CL 103 10/01/2010 0000   CO2 25 01/30/2019 0611   CO2 26 11/23/2013 0358   CO2 23 10/01/2010 0000   GLUCOSE 168 (H) 01/30/2019 0611   GLUCOSE 81 11/23/2013 0358   BUN 63 (H) 01/30/2019 0611   BUN 43 (H) 11/23/2013  0358   CREATININE 3.34 (H) 01/30/2019 0611   CREATININE 2.73 (H) 11/23/2013 0358   CREATININE 1.9 10/01/2010 0000   CALCIUM 8.5 (L) 01/30/2019 0611   CALCIUM 8.7 11/23/2013 0358   CALCIUM 9.0 10/01/2010 0000   CALCIUM 9.3 10/01/2010 0000    GFRNONAA 12 (L) 01/30/2019 0611   GFRNONAA 18 (L) 11/23/2013 0358   GFRAA 14 (L) 01/30/2019 0611   GFRAA 22 (L) 11/23/2013 0358   Estimated Creatinine Clearance: 14.6 mL/min (A) (by C-G formula based on SCr of 3.34 mg/dL (H)).  COAG No results found for: INR, PROTIME  Radiology CT HEAD WO CONTRAST  Result Date: 01/28/2019 CLINICAL DATA:  Headache EXAM: CT HEAD WITHOUT CONTRAST TECHNIQUE: Contiguous axial images were obtained from the base of the skull through the vertex without intravenous contrast. COMPARISON:  CT head dated 11/22/2013. FINDINGS: Brain: No evidence of acute infarction, hemorrhage, hydrocephalus, extra-axial collection or mass lesion/mass effect. There is a chronic infarct of the left corona radiata. Periventricular white matter hypoattenuation likely represents chronic small vessel ischemic disease. Vascular: There are vascular calcifications in the carotid siphons. Skull: Normal. Negative for fracture or focal lesion. Sinuses/Orbits: No acute finding. Other: None. IMPRESSION: 1. No acute intracranial process. Electronically Signed   By: Zerita Boers M.D.   On: 01/28/2019 16:21   US Abdomen Complete  Result Date: 01/30/2019 CLINICAL DATA:  Epigastric pain. Personal history partial nephrectomy on the left. EXAM: ABDOMEN ULTRASOUND COMPLETE COMPARISON:  Renal ultrasound 09/21/2016. Abdominal ultrasound 08/21/2008. FINDINGS: Gallbladder: No gallstones or wall thickening visualized. No sonographic Murphy sign noted by sonographer. Common bile duct: Diameter: 3.5 mm, within normal limits. Liver: The liver is diffusely echogenic. No discrete lesions are present. Findings are similar the prior study. Portal vein is patent on color Doppler imaging with normal direction of blood flow towards the liver. IVC: No abnormality visualized. Pancreas: Visualized portion unremarkable. Spleen: Size and appearance within normal limits. Right Kidney: Length: 9.8 cm. There is progressive thinning of  the renal parenchyma. The parenchyma is diffusely echogenic. No discrete lesions are present. Left Kidney: Length: 8.9 cm. There is progressive thinning of the renal parenchyma. The parenchyma is echogenic. No discrete lesions are present. Abdominal aorta: 2.4 cm, within limits Other findings: None. IMPRESSION: 1. Increased echogenicity of the renal parenchyma. This is nonspecific, but seen in the setting medical renal disease. 2. Progressive thinning of the renal parenchyma. 3. Hepatic steatosis. 4. No acute or other focal lesion to explain the patient's abdominal pain. Electronically Signed   By: San Morelle M.D.   On: 01/30/2019 09:14   DG Chest Port 1 View  Result Date: 01/28/2019 CLINICAL DATA:  Shortness of breath. EXAM: PORTABLE CHEST 1 VIEW COMPARISON:  December 29, 2017 FINDINGS: The heart size is mildly enlarged but stable. The hila and mediastinum are normal. No pneumothorax. No nodules or masses. No focal infiltrates or overt edema. IMPRESSION: No active disease. Electronically Signed   By: Dorise Bullion III M.D   On: 01/28/2019 16:17   Assessment/Plan The patient is an 79 year old female with multiple medical issues including known history of chronic kidney disease and partial nephrectomy now presenting with worsening kidney function.   1. Chronic kidney disease: Known history now progressing.  Seen by nephrology who would like to initiate dialysis in the near future.  The patient has decided to move forward with peritoneal dialysis.  We will place a laparoscopic peritoneal dialysis catheter tomorrow.  Procedure, risks and benefits explained to the patient.  All questions answered.  Patient wishes to proceed. 2. Diabetes: On appropriate medications. Encouraged good control as its slows the progression of atherosclerotic disease. 3. Hyperlipidemia: On statin Plavix. Encouraged good control as its slows the progression of atherosclerotic disease.  Discussed with Dr. Mayme Genta, PA-C  01/30/2019 1:55 PM  This note was created with Dragon medical transcription system.  Any error is purely unintentional.

## 2019-01-30 NOTE — Progress Notes (Signed)
[facility]  ROUNDING NOTE   Subjective:   Patient feels much better today. She has no complaints.   Infiltrated IV this morning.   Objective:  Vital signs in last 24 hours:  Temp:  [95.9 F (35.5 C)-99.8 F (37.7 C)] 98.7 F (37.1 C) (01/12 0800) Pulse Rate:  [59-65] 65 (01/12 0800) Resp:  [17-23] 17 (01/12 0800) BP: (108-161)/(51-78) 145/78 (01/12 0800) SpO2:  [96 %-99 %] 96 % (01/12 0428)  Weight change:  Filed Weights   01/28/19 1357 01/29/19 0100  Weight: 93.4 kg 93.9 kg    Intake/Output: I/O last 3 completed shifts: In: 2429.6 [P.O.:1200; I.V.:229.6; IV Piggyback:1000] Out: -    Intake/Output this shift:  No intake/output data recorded.  Physical Exam: General: NAD, laying in bed  Head: Normocephalic, atraumatic. Moist oral mucosal membranes  Eyes: Anicteric, PERRL  Neck: Supple, trachea midline  Lungs:  Clear to auscultation  Heart: bradycardia  Abdomen:  Soft, nontender, obese  Extremities: trace peripheral edema.  Neurologic: Nonfocal, moving all four extremities  Skin: No lesions  Access: none    Basic Metabolic Panel: Recent Labs  Lab 01/28/19 1401 01/28/19 2003 01/29/19 0530 01/30/19 0611  NA 132* 135 137 140  K 6.6* 5.9* 5.3* 4.3  CL 103 106 107 105  CO2 18* 19* 20* 25  GLUCOSE 191* 271* 153* 168*  BUN 82* 81* 76* 63*  CREATININE 4.40* 4.13* 3.98* 3.34*  CALCIUM 9.4 8.7* 9.0 8.5*    Liver Function Tests: Recent Labs  Lab 01/28/19 1406  AST 20  ALT 14  ALKPHOS 64  BILITOT 1.0  PROT 7.4  ALBUMIN 3.5   Recent Labs  Lab 01/28/19 1406  LIPASE 44   No results for input(s): AMMONIA in the last 168 hours.  CBC: Recent Labs  Lab 01/28/19 1401 01/29/19 0530  WBC 11.3* 7.4  HGB 12.1 11.1*  HCT 37.6 34.5*  MCV 90.6 90.1  PLT 276 232    Cardiac Enzymes: Recent Labs  Lab 01/28/19 2003  CKTOTAL 74    BNP: Invalid input(s): POCBNP  CBG: Recent Labs  Lab 01/29/19 1204 01/29/19 1642 01/29/19 2208  01/30/19 0002 01/30/19 0757  GLUCAP 247* 198* 132* 208* 140*    Microbiology: Results for orders placed or performed during the hospital encounter of 01/28/19  Respiratory Panel by RT PCR (Flu A&B, Covid) - Nasopharyngeal Swab     Status: None   Collection Time: 01/28/19  4:18 PM   Specimen: Nasopharyngeal Swab  Result Value Ref Range Status   SARS Coronavirus 2 by RT PCR NEGATIVE NEGATIVE Final    Comment: (NOTE) SARS-CoV-2 target nucleic acids are NOT DETECTED. The SARS-CoV-2 RNA is generally detectable in upper respiratoy specimens during the acute phase of infection. The lowest concentration of SARS-CoV-2 viral copies this assay can detect is 131 copies/mL. A negative result does not preclude SARS-Cov-2 infection and should not be used as the sole basis for treatment or other patient management decisions. A negative result may occur with  improper specimen collection/handling, submission of specimen other than nasopharyngeal swab, presence of viral mutation(s) within the areas targeted by this assay, and inadequate number of viral copies (<131 copies/mL). A negative result must be combined with clinical observations, patient history, and epidemiological information. The expected result is Negative. Fact Sheet for Patients:  PinkCheek.be Fact Sheet for Healthcare Providers:  GravelBags.it This test is not yet ap proved or cleared by the [place] FDA and  has been authorized for detection and/or diagnosis of  SARS-CoV-2 by FDA under an Emergency Use Authorization (EUA). This EUA will remain  in effect (meaning this test can be used) for the duration of the COVID-19 declaration under Section 564(b)(1) of the Act, 21 U.S.C. section 360bbb-3(b)(1), unless the authorization is terminated or revoked sooner.    Influenza A by PCR NEGATIVE NEGATIVE Final   Influenza B by PCR NEGATIVE NEGATIVE Final    Comment:  (NOTE) The Xpert Xpress SARS-CoV-2/FLU/RSV assay is intended as an aid in  the diagnosis of influenza from Nasopharyngeal swab specimens and  should not be used as a sole basis for treatment. Nasal washings and  aspirates are unacceptable for Xpert Xpress SARS-CoV-2/FLU/RSV  testing. Fact Sheet for Patients: PinkCheek.be Fact Sheet for Healthcare Providers: GravelBags.it This test is not yet approved or cleared by the [place] FDA and  has been authorized for detection and/or diagnosis of SARS-CoV-2 by  FDA under an Emergency Use Authorization (EUA). This EUA will remain  in effect (meaning this test can be used) for the duration of the  Covid-19 declaration under Section 564(b)(1) of the Act, 21  U.S.C. section 360bbb-3(b)(1), unless the authorization is  terminated or revoked. Performed at [facility], [address] [place], [place] [address]     Coagulation Studies: No results for input(s): LABPROT, INR in the last 72 hours.  Urinalysis: Recent Labs    01/28/19 1836  COLORURINE YELLOW*  LABSPEC 1.009  PHURINE 5.0  GLUCOSEU NEGATIVE  HGBUR NEGATIVE  BILIRUBINUR NEGATIVE  KETONESUR NEGATIVE  PROTEINUR 100*  NITRITE NEGATIVE  LEUKOCYTESUR NEGATIVE      Imaging: CT HEAD WO CONTRAST  Result Date: 01/28/2019 CLINICAL DATA:  Headache EXAM: CT HEAD WITHOUT CONTRAST TECHNIQUE: Contiguous axial images were obtained from the base of the skull through the vertex without intravenous contrast. COMPARISON:  CT head dated 11/22/2013. FINDINGS: Brain: No evidence of acute infarction, hemorrhage, hydrocephalus, extra-axial collection or mass lesion/mass effect. There is a chronic infarct of the left corona radiata. Periventricular white matter hypoattenuation likely represents chronic small vessel ischemic disease. Vascular: There are vascular calcifications in the carotid siphons. Skull: Normal. Negative for  fracture or focal lesion. Sinuses/Orbits: No acute finding. Other: None. IMPRESSION: 1. No acute intracranial process. Electronically Signed   By: Zerita Boers M.D.   On: 01/28/2019 16:21   US Abdomen Complete  Result Date: 01/30/2019 CLINICAL DATA:  Epigastric pain. Personal history partial nephrectomy on the left. EXAM: ABDOMEN ULTRASOUND COMPLETE COMPARISON:  Renal ultrasound 09/21/2016. Abdominal ultrasound 08/21/2008. FINDINGS: Gallbladder: No gallstones or wall thickening visualized. No sonographic Murphy sign noted by sonographer. Common bile duct: Diameter: 3.5 mm, within normal limits. Liver: The liver is diffusely echogenic. No discrete lesions are present. Findings are similar the prior study. Portal vein is patent on color Doppler imaging with normal direction of blood flow towards the liver. IVC: No abnormality visualized. Pancreas: Visualized portion unremarkable. Spleen: Size and appearance within normal limits. Right Kidney: Length: 9.8 cm. There is progressive thinning of the renal parenchyma. The parenchyma is diffusely echogenic. No discrete lesions are present. Left Kidney: Length: 8.9 cm. There is progressive thinning of the renal parenchyma. The parenchyma is echogenic. No discrete lesions are present. Abdominal aorta: 2.4 cm, within limits Other findings: None. IMPRESSION: 1. Increased echogenicity of the renal parenchyma. This is nonspecific, but seen in the setting medical renal disease. 2. Progressive thinning of the renal parenchyma. 3. Hepatic steatosis. 4. No acute or other focal lesion to explain the patient's abdominal pain. Electronically Signed  By: San Morelle M.D.   On: 01/30/2019 09:14   DG Chest Port 1 View  Result Date: 01/28/2019 CLINICAL DATA:  Shortness of breath. EXAM: PORTABLE CHEST 1 VIEW COMPARISON:  December 29, 2017 FINDINGS: The heart size is mildly enlarged but stable. The hila and mediastinum are normal. No pneumothorax. No nodules or masses. No  focal infiltrates or overt edema. IMPRESSION: No active disease. Electronically Signed   By: Dorise Bullion III M.D   On: 01/28/2019 16:17     Medications:   . sodium bicarbonate 150 mEq in dextrose 5% 1000 mL 150 mEq (01/29/19 1156)   . allopurinol  50 mg Oral Daily  . amLODipine  10 mg Oral Daily  . atorvastatin  40 mg Oral Daily  . calcitRIOL  0.25 mcg Oral Daily  . clopidogrel  75 mg Oral Daily  . heparin  5,000 Units Subcutaneous Q8H  . insulin aspart  0-15 Units Subcutaneous TID WC  . insulin aspart  0-5 Units Subcutaneous QHS  . pantoprazole  40 mg Oral Daily  . sodium zirconium cyclosilicate  10 g Oral BID  . umeclidinium-vilanterol  1 puff Inhalation Daily   acetaminophen **OR** acetaminophen, albuterol, ondansetron **OR** ondansetron (ZOFRAN) IV, zolpidem  Assessment/ Plan:  Megan [name] is a 79 y.o. black female with diabetes mellitus type II insulin dependent, hypertension, hyperlipidemia, renal cell carcinoma status post right nephrectomy, CVA, gout, COPD, and GERD  1. 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  Secondary to diabetic nephropathy and partial nephrectomy This is progression of disease and will most likely benefit from initiating dialysis.  - Consulted vascular for peritoneal dialysis placement. On the schedule for tomorrow.  - Low potassium diet - Lokelma - Dialysis education - Continue IV fluids - Hepatitis panel and quantiferol sent this morning  2. Hypertension: holding bisoprolol lisinopril, furosemide and spironolactone. Currently blood pressure at goal. History of difficult to control.    3. Secondary Hyperparathyroidism - Pending PTH, and phosphorus  4. Anemia of chronic kidney disease: hemoglobin 11.1    LOS: 2 Megan [name] 1/12/202111:04 AM

## 2019-01-31 ENCOUNTER — Inpatient Hospital Stay: Payer: Medicare HMO | Admitting: Anesthesiology

## 2019-01-31 ENCOUNTER — Encounter
Admission: EM | Disposition: A | Discharge status: Home / Self Care | Payer: Self-pay | Source: Home / Self Care | Attending: Internal Medicine

## 2019-01-31 ENCOUNTER — Encounter: Payer: Self-pay | Admitting: Internal Medicine

## 2019-01-31 DIAGNOSIS — N185 Chronic kidney disease, stage 5: Secondary | ICD-10-CM

## 2019-01-31 DIAGNOSIS — K660 Peritoneal adhesions (postprocedural) (postinfection): Secondary | ICD-10-CM

## 2019-01-31 DIAGNOSIS — R1013 Epigastric pain: Secondary | ICD-10-CM

## 2019-01-31 HISTORY — PX: CAPD INSERTION: SHX5233

## 2019-01-31 LAB — GLUCOSE, CAPILLARY
Glucose-Capillary: 188 mg/dL — ABNORMAL HIGH (ref 70–99)
Glucose-Capillary: 162 mg/dL — ABNORMAL HIGH (ref 70–99)
Glucose-Capillary: 91 mg/dL (ref 70–99)
Glucose-Capillary: 96 mg/dL (ref 70–99)
Glucose-Capillary: 142 mg/dL — ABNORMAL HIGH (ref 70–99)
Glucose-Capillary: 168 mg/dL — ABNORMAL HIGH (ref 70–99)
Glucose-Capillary: 191 mg/dL — ABNORMAL HIGH (ref 70–99)

## 2019-01-31 LAB — RENAL FUNCTION PANEL
Albumin: 3.1 g/dL — ABNORMAL LOW (ref 3.5–5.0)
Anion gap: 10 (ref 5–15)
BUN: 45 mg/dL — ABNORMAL HIGH (ref 8–23)
CO2: 27 mmol/L (ref 22–32)
Calcium: 9 mg/dL (ref 8.9–10.3)
Chloride: 104 mmol/L (ref 98–111)
Creatinine, Ser: 2.69 mg/dL — ABNORMAL HIGH (ref 0.44–1.00)
GFR calc Af Amer: 19 mL/min — ABNORMAL LOW (ref >60)
GFR calc non Af Amer: 16 mL/min — ABNORMAL LOW (ref >60)
Glucose, Bld: 179 mg/dL — ABNORMAL HIGH (ref 70–99)
Phosphorus: 2.8 mg/dL (ref 2.5–4.6)
Potassium: 3.9 mmol/L (ref 3.5–5.1)
Sodium: 141 mmol/L (ref 135–145)

## 2019-01-31 LAB — PROTIME-INR
INR: 1 (ref 0.8–1.2)
Prothrombin Time: 13.2 seconds (ref 11.4–15.2)

## 2019-01-31 LAB — TYPE AND SCREEN
ABO/RH(D): O POS
Antibody Screen: NEGATIVE

## 2019-01-31 LAB — MAGNESIUM: Magnesium: 1.7 mg/dL (ref 1.7–2.4)

## 2019-01-31 LAB — CBC
HCT: 33.6 % — ABNORMAL LOW (ref 36.0–46.0)
Hemoglobin: 10.7 g/dL — ABNORMAL LOW (ref 12.0–15.0)
MCH: 28.7 pg (ref 26.0–34.0)
MCHC: 31.8 g/dL (ref 30.0–36.0)
MCV: 90.1 fL (ref 80.0–100.0)
Platelets: 245 10*3/uL (ref 150–400)
RBC: 3.73 MIL/uL — ABNORMAL LOW (ref 3.87–5.11)
RDW: 14.4 % (ref 11.5–15.5)
WBC: 7.8 10*3/uL (ref 4.0–10.5)
nRBC: 0 % (ref 0.0–0.2)

## 2019-01-31 LAB — PARATHYROID HORMONE, INTACT (NO CA): PTH: 245 pg/mL — ABNORMAL HIGH (ref 15–65)

## 2019-01-31 LAB — APTT: aPTT: 36 seconds (ref 24–36)

## 2019-01-31 SURGERY — LAPAROSCOPIC INSERTION CONTINUOUS AMBULATORY PERITONEAL DIALYSIS  (CAPD) CATHETER
Anesthesia: General

## 2019-01-31 MED ORDER — ACETAMINOPHEN 10 MG/ML IV SOLN
INTRAVENOUS | Status: AC
  Filled 2019-01-31: qty 100

## 2019-01-31 MED ORDER — PROPOFOL 10 MG/ML IV BOLUS
INTRAVENOUS | Status: AC
  Filled 2019-01-31: qty 20

## 2019-01-31 MED ORDER — FENTANYL CITRATE (PF) 100 MCG/2ML IJ SOLN
INTRAMUSCULAR | Status: AC
  Administered 2019-01-31: 25 ug via INTRAVENOUS
  Filled 2019-01-31: qty 2

## 2019-01-31 MED ORDER — ACETAMINOPHEN 10 MG/ML IV SOLN
INTRAVENOUS | Status: DC | PRN
  Administered 2019-01-31: 1000 mg via INTRAVENOUS

## 2019-01-31 MED ORDER — CEFAZOLIN SODIUM-DEXTROSE 1-4 GM/50ML-% IV SOLN
INTRAVENOUS | Status: AC
  Filled 2019-01-31: qty 50

## 2019-01-31 MED ORDER — ROCURONIUM BROMIDE 50 MG/5ML IV SOLN
INTRAVENOUS | Status: AC
  Filled 2019-01-31: qty 1

## 2019-01-31 MED ORDER — HEPARIN SODIUM (PORCINE) 5000 UNIT/ML IJ SOLN
INTRAMUSCULAR | Status: AC
  Administered 2019-01-31: 02:00:00 5000 [IU] via SUBCUTANEOUS
  Filled 2019-01-31: qty 1

## 2019-01-31 MED ORDER — ONDANSETRON HCL 4 MG/2ML IJ SOLN
INTRAMUSCULAR | Status: DC | PRN
  Administered 2019-01-31: 4 mg via INTRAVENOUS

## 2019-01-31 MED ORDER — ROCURONIUM BROMIDE 100 MG/10ML IV SOLN
INTRAVENOUS | Status: DC | PRN
  Administered 2019-01-31: 10 mg via INTRAVENOUS
  Administered 2019-01-31: 30 mg via INTRAVENOUS

## 2019-01-31 MED ORDER — SUGAMMADEX SODIUM 200 MG/2ML IV SOLN
INTRAVENOUS | Status: DC | PRN
  Administered 2019-01-31: 400 mg via INTRAVENOUS

## 2019-01-31 MED ORDER — FENTANYL CITRATE (PF) 100 MCG/2ML IJ SOLN
INTRAMUSCULAR | Status: AC
  Filled 2019-01-31: qty 2

## 2019-01-31 MED ORDER — SUCCINYLCHOLINE CHLORIDE 20 MG/ML IJ SOLN
INTRAMUSCULAR | Status: DC | PRN
  Administered 2019-01-31: 100 mg via INTRAVENOUS

## 2019-01-31 MED ORDER — DEXAMETHASONE SODIUM PHOSPHATE 10 MG/ML IJ SOLN
INTRAMUSCULAR | Status: DC | PRN
  Administered 2019-01-31: 10 mg via INTRAVENOUS

## 2019-01-31 MED ORDER — PROPOFOL 10 MG/ML IV BOLUS
INTRAVENOUS | Status: DC | PRN
  Administered 2019-01-31: 100 mg via INTRAVENOUS

## 2019-01-31 MED ORDER — SODIUM CHLORIDE 0.9 % IV SOLN: INTRAVENOUS | Status: DC | PRN

## 2019-01-31 MED ORDER — HEPARIN SODIUM (PORCINE) 5000 UNIT/ML IJ SOLN
INTRAMUSCULAR | Status: AC
  Administered 2019-01-31: 5000 [IU] via SUBCUTANEOUS
  Filled 2019-01-31: qty 1

## 2019-01-31 MED ORDER — BISOPROLOL FUMARATE 5 MG PO TABS
10.0000 mg | ORAL_TABLET | Freq: Every day | ORAL | Status: DC
  Administered 2019-01-31 – 2019-02-01 (×2): 10 mg via ORAL
  Filled 2019-01-31 (×2): qty 2

## 2019-01-31 MED ORDER — FENTANYL CITRATE (PF) 100 MCG/2ML IJ SOLN
25.0000 ug | INTRAMUSCULAR | Status: DC | PRN
  Administered 2019-01-31: 25 ug via INTRAVENOUS

## 2019-01-31 MED ORDER — LABETALOL HCL 5 MG/ML IV SOLN: 10.0000 mg | INTRAVENOUS | Status: DC | PRN

## 2019-01-31 MED ORDER — INSULIN ASPART 100 UNIT/ML ~~LOC~~ SOLN
SUBCUTANEOUS | Status: AC
  Filled 2019-01-31: qty 1

## 2019-01-31 MED ORDER — HEPARIN SODIUM (PORCINE) 5000 UNIT/ML IJ SOLN
INTRAMUSCULAR | Status: AC
  Filled 2019-01-31: qty 1

## 2019-01-31 MED ORDER — DEXAMETHASONE SODIUM PHOSPHATE 10 MG/ML IJ SOLN
INTRAMUSCULAR | Status: AC
  Filled 2019-01-31: qty 1

## 2019-01-31 MED ORDER — ONDANSETRON HCL 4 MG/2ML IJ SOLN
INTRAMUSCULAR | Status: AC
  Filled 2019-01-31: qty 2

## 2019-01-31 MED ORDER — FENTANYL CITRATE (PF) 100 MCG/2ML IJ SOLN
INTRAMUSCULAR | Status: DC | PRN
  Administered 2019-01-31: 50 ug via INTRAVENOUS

## 2019-01-31 MED ORDER — LIDOCAINE HCL (CARDIAC) PF 100 MG/5ML IV SOSY
PREFILLED_SYRINGE | INTRAVENOUS | Status: DC | PRN
  Administered 2019-01-31: 60 mg via INTRAVENOUS

## 2019-01-31 SURGICAL SUPPLY — 38 items
"PENCIL ELECTRO HAND CTR " (MISCELLANEOUS) ×1 IMPLANT
ADAPTER BETA CAP QUINTON DIALY (ADAPTER) IMPLANT
ADAPTER CATH DIALYSIS 18.75 (CATHETERS) ×2 IMPLANT
ADH SKN CLS APL DERMABOND .7 (GAUZE/BANDAGES/DRESSINGS) ×1
ADPR DLYS BCP STRL PRTNL ULTEM (ADAPTER)
APL PRP STRL LF DISP 70% ISPRP (MISCELLANEOUS) ×1
CANISTER SUCT 1200ML W/VALVE (MISCELLANEOUS) ×2 IMPLANT
CATH DLYS SWAN NECK 62.5CM (CATHETERS) ×2 IMPLANT
CHLORAPREP W/TINT 26 (MISCELLANEOUS) ×2 IMPLANT
COVER WAND RF STERILE (DRAPES) ×2 IMPLANT
DERMABOND ADVANCED (GAUZE/BANDAGES/DRESSINGS) ×1
DERMABOND ADVANCED .7 DNX12 (GAUZE/BANDAGES/DRESSINGS) ×1 IMPLANT
ELECT CAUTERY BLADE 6.4 (BLADE) ×2 IMPLANT
ELECT REM PT RETURN 9FT ADLT (ELECTROSURGICAL) ×2
ELECTRODE REM PT RTRN 9FT ADLT (ELECTROSURGICAL) ×1 IMPLANT
GLOVE BIO SURGEON STRL SZ7 (GLOVE) ×4 IMPLANT
GLOVE INDICATOR 7.5 STRL GRN (GLOVE) ×2 IMPLANT
GOWN STRL REUS W/ TWL LRG LVL3 (GOWN DISPOSABLE) ×2 IMPLANT
GOWN STRL REUS W/ TWL XL LVL3 (GOWN DISPOSABLE) ×1 IMPLANT
GOWN STRL REUS W/TWL LRG LVL3 (GOWN DISPOSABLE) ×4
GOWN STRL REUS W/TWL XL LVL3 (GOWN DISPOSABLE) ×2
IV NS 500ML (IV SOLUTION) ×2
IV NS 500ML BAXH (IV SOLUTION) ×1 IMPLANT
KIT TURNOVER KIT A (KITS) ×2 IMPLANT
LABEL OR SOLS (LABEL) ×1 IMPLANT
MINICAP W/POVIDONE IODINE SOL (MISCELLANEOUS) ×2 IMPLANT
PACK LAP CHOLECYSTECTOMY (MISCELLANEOUS) ×2 IMPLANT
PENCIL ELECTRO HAND CTR (MISCELLANEOUS) ×2 IMPLANT
SET CYSTO W/LG BORE CLAMP LF (SET/KITS/TRAYS/PACK) ×2 IMPLANT
SET TRANSFER 6 W/TWIST CLAMP 5 (SET/KITS/TRAYS/PACK) ×2 IMPLANT
SET TUBE SMOKE EVAC HIGH FLOW (TUBING) ×2 IMPLANT
SPONGE DRAIN TRACH 4X4 STRL 2S (GAUZE/BANDAGES/DRESSINGS) ×2 IMPLANT
SUT MNCRL AB 4-0 PS2 18 (SUTURE) ×2 IMPLANT
SUT VIC AB 2-0 UR6 27 (SUTURE) ×2 IMPLANT
SUT VICRYL+ 3-0 36IN CT-1 (SUTURE) ×2 IMPLANT
TROCAR XCEL NON-BLD 11X100MML (ENDOMECHANICALS) ×2 IMPLANT
TROCAR XCEL NON-BLD 5MMX100MML (ENDOMECHANICALS) IMPLANT
TROCAR XCEL UNIV SLVE 11M 100M (ENDOMECHANICALS) ×1 IMPLANT

## 2019-01-31 NOTE — Progress Notes (Signed)
[facility]  ROUNDING NOTE   Subjective:   PD catheter to be placed today.    Objective:  Vital signs in last 24 hours:  Temp:  [97.6 F (36.4 C)-98.3 F (36.8 C)] 98.3 F (36.8 C) (01/13 0800) Pulse Rate:  [55-76] 76 (01/13 0800) Resp:  [17-19] 18 (01/13 0800) BP: (143-187)/(59-74) 187/59 (01/13 0800) SpO2:  [98 %-100 %] 99 % (01/13 0800)  Weight change:  Filed Weights   01/28/19 1357 01/29/19 0100  Weight: 93.4 kg 93.9 kg    Intake/Output: I/O last 3 completed shifts: In: 500 [I.V.:500] Out: -    Intake/Output this shift:  No intake/output data recorded.  Physical Exam: General: NAD, laying in bed  Head: Normocephalic, atraumatic. Moist oral mucosal membranes  Eyes: Anicteric, PERRL  Neck: Supple, trachea midline  Lungs:  Clear to auscultation  Heart: bradycardia  Abdomen:  Soft, nontender, obese  Extremities: trace peripheral edema.  Neurologic: Nonfocal, moving all four extremities  Skin: No lesions  Access: none    Basic Metabolic Panel: Recent Labs  Lab 01/28/19 1401 01/28/19 2003 01/29/19 0530 01/30/19 0611 01/31/19 0632  NA 132* 135 137 140 141  K 6.6* 5.9* 5.3* 4.3 3.9  CL 103 106 107 105 104  CO2 18* 19* 20* 25 27  GLUCOSE 191* 271* 153* 168* 179*  BUN 82* 81* 76* 63* 45*  CREATININE 4.40* 4.13* 3.98* 3.34* 2.69*  CALCIUM 9.4 8.7* 9.0 8.5* 9.0  MG  --   --   --   --  1.7  PHOS  --   --   --   --  2.8    Liver Function Tests: Recent Labs  Lab 01/28/19 1406 01/31/19 0632  AST 20  --   ALT 14  --   ALKPHOS 64  --   BILITOT 1.0  --   PROT 7.4  --   ALBUMIN 3.5 3.1*   Recent Labs  Lab 01/28/19 1406  LIPASE 44   No results for input(s): AMMONIA in the last 168 hours.  CBC: Recent Labs  Lab 01/28/19 1401 01/29/19 0530 01/31/19 0632  WBC 11.3* 7.4 7.8  HGB 12.1 11.1* 10.7*  HCT 37.6 34.5* 33.6*  MCV 90.6 90.1 90.1  PLT 276 232 245    Cardiac Enzymes: Recent Labs  Lab 01/28/19 2003  CKTOTAL 74     BNP: Invalid input(s): POCBNP  CBG: Recent Labs  Lab 01/30/19 1155 01/30/19 1638 01/30/19 2205 01/31/19 0405 01/31/19 0743  GLUCAP 110* 242* 182* 188* 162*    Microbiology: Results for orders placed or performed during the hospital encounter of 01/28/19  Respiratory Panel by RT PCR (Flu A&B, Covid) - Nasopharyngeal Swab     Status: None   Collection Time: 01/28/19  4:18 PM   Specimen: Nasopharyngeal Swab  Result Value Ref Range Status   SARS Coronavirus 2 by RT PCR NEGATIVE NEGATIVE Final    Comment: (NOTE) SARS-CoV-2 target nucleic acids are NOT DETECTED. The SARS-CoV-2 RNA is generally detectable in upper respiratoy specimens during the acute phase of infection. The lowest concentration of SARS-CoV-2 viral copies this assay can detect is 131 copies/mL. A negative result does not preclude SARS-Cov-2 infection and should not be used as the sole basis for treatment or other patient management decisions. A negative result may occur with  improper specimen collection/handling, submission of specimen other than nasopharyngeal swab, presence of viral mutation(s) within the areas targeted by this assay, and inadequate number of viral copies (<131 copies/mL). A negative  result must be combined with clinical observations, patient history, and epidemiological information. The expected result is Negative. Fact Sheet for Patients:  PinkCheek.be Fact Sheet for Healthcare Providers:  GravelBags.it This test is not yet ap proved or cleared by the [place] FDA and  has been authorized for detection and/or diagnosis of SARS-CoV-2 by FDA under an Emergency Use Authorization (EUA). This EUA will remain  in effect (meaning this test can be used) for the duration of the COVID-19 declaration under Section 564(b)(1) of the Act, 21 U.S.C. section 360bbb-3(b)(1), unless the authorization is terminated or revoked sooner.     Influenza A by PCR NEGATIVE NEGATIVE Final   Influenza B by PCR NEGATIVE NEGATIVE Final    Comment: (NOTE) The Xpert Xpress SARS-CoV-2/FLU/RSV assay is intended as an aid in  the diagnosis of influenza from Nasopharyngeal swab specimens and  should not be used as a sole basis for treatment. Nasal washings and  aspirates are unacceptable for Xpert Xpress SARS-CoV-2/FLU/RSV  testing. Fact Sheet for Patients: PinkCheek.be Fact Sheet for Healthcare Providers: GravelBags.it This test is not yet approved or cleared by the [place] FDA and  has been authorized for detection and/or diagnosis of SARS-CoV-2 by  FDA under an Emergency Use Authorization (EUA). This EUA will remain  in effect (meaning this test can be used) for the duration of the  Covid-19 declaration under Section 564(b)(1) of the Act, 21  U.S.C. section 360bbb-3(b)(1), unless the authorization is  terminated or revoked. Performed at [facility], [address] [place], [place] [address]     Coagulation Studies: Recent Labs    01/31/19 9937  LABPROT 13.2  INR 1.0    Urinalysis: Recent Labs    01/28/19 1836  COLORURINE YELLOW*  LABSPEC 1.009  PHURINE 5.0  GLUCOSEU NEGATIVE  HGBUR NEGATIVE  BILIRUBINUR NEGATIVE  KETONESUR NEGATIVE  PROTEINUR 100*  NITRITE NEGATIVE  LEUKOCYTESUR NEGATIVE      Imaging: US Abdomen Complete  Result Date: 01/30/2019 CLINICAL DATA:  Epigastric pain. Personal history partial nephrectomy on the left. EXAM: ABDOMEN ULTRASOUND COMPLETE COMPARISON:  Renal ultrasound 09/21/2016. Abdominal ultrasound 08/21/2008. FINDINGS: Gallbladder: No gallstones or wall thickening visualized. No sonographic Murphy sign noted by sonographer. Common bile duct: Diameter: 3.5 mm, within normal limits. Liver: The liver is diffusely echogenic. No discrete lesions are present. Findings are similar the prior study. Portal vein is patent on  color Doppler imaging with normal direction of blood flow towards the liver. IVC: No abnormality visualized. Pancreas: Visualized portion unremarkable. Spleen: Size and appearance within normal limits. Right Kidney: Length: 9.8 cm. There is progressive thinning of the renal parenchyma. The parenchyma is diffusely echogenic. No discrete lesions are present. Left Kidney: Length: 8.9 cm. There is progressive thinning of the renal parenchyma. The parenchyma is echogenic. No discrete lesions are present. Abdominal aorta: 2.4 cm, within limits Other findings: None. IMPRESSION: 1. Increased echogenicity of the renal parenchyma. This is nonspecific, but seen in the setting medical renal disease. 2. Progressive thinning of the renal parenchyma. 3. Hepatic steatosis. 4. No acute or other focal lesion to explain the patient's abdominal pain. Electronically Signed   By: San Morelle M.D.   On: 01/30/2019 09:14     Medications:   . sodium chloride Stopped (01/30/19 2039)  .  ceFAZolin (ANCEF) IV    . sodium bicarbonate 150 mEq in dextrose 5% 1000 mL 150 mEq (01/30/19 1146)   . allopurinol  50 mg Oral Daily  . amLODipine  10 mg Oral  Daily  . atorvastatin  40 mg Oral Daily  . calcitRIOL  0.25 mcg Oral Daily  . Chlorhexidine Gluconate Cloth  6 each Topical Daily  . clopidogrel  75 mg Oral Daily  . heparin  5,000 Units Subcutaneous Q8H  . insulin aspart      . insulin aspart  0-15 Units Subcutaneous TID WC  . insulin aspart  0-5 Units Subcutaneous QHS  . pantoprazole  40 mg Oral Daily  . sodium zirconium cyclosilicate  10 g Oral BID  . umeclidinium-vilanterol  1 puff Inhalation Daily   acetaminophen **OR** acetaminophen, albuterol, ondansetron **OR** ondansetron (ZOFRAN) IV, zolpidem  Assessment/ Plan:  Ms. Megan [name] is a 79 y.o. black female with diabetes mellitus type II insulin dependent, hypertension, hyperlipidemia, renal cell carcinoma status post right nephrectomy, CVA, gout, COPD, and  GERD  1. 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  Secondary to diabetic nephropathy and partial nephrectomy This is progression of disease and will most likely benefit from initiating dialysis.  - Consulted vascular for peritoneal dialysis placement. On the schedule for later today. Start peritoneal dialysis tomorrow. Then will start home dialysis training next week.  - Low potassium diet - Lokelma - Dialysis education - Discontinue IV fluids - Hepatitis panel and quantiferol sent    2. Hypertension: holding bisoprolol lisinopril, furosemide and spironolactone. Currently blood pressure elevated. History of difficult to control.  - IV labetalol PRN - restart bisoprolol.    3. Secondary Hyperparathyroidism: PTH 245. Phos and calcium at goal.   4. Anemia of chronic kidney disease: hemoglobin 10.7   LOS: 3 Ashaki Frosch 1/13/20219:23 AM

## 2019-01-31 NOTE — Transfer of Care (Signed)
Immediate Anesthesia Transfer of Care Note  Patient: Megan [name]  Procedure(s) Performed: LAPAROSCOPIC INSERTION CONTINUOUS AMBULATORY PERITONEAL DIALYSIS  (CAPD) CATHETER (N/A )  Patient Location: PACU  Anesthesia Type:General  Level of Consciousness: awake, alert  and oriented  Airway & Oxygen Therapy: Patient Spontanous Breathing and Patient connected to face mask oxygen  Post-op Assessment: Report given to RN and Post -op Vital signs reviewed and stable  Post vital signs: Reviewed and stable  Last Vitals:  Vitals Value Taken Time  BP    Temp    Pulse    Resp    SpO2      Last Pain:  Vitals:   01/31/19 1353  TempSrc: Tympanic  PainSc: 0-No pain         Complications: No apparent anesthesia complications

## 2019-01-31 NOTE — Anesthesia Preprocedure Evaluation (Addendum)
Anesthesia Evaluation  Patient identified by MRN, date of birth, ID band Patient awake    Reviewed: Allergy & Precautions, H&P , NPO status , Patient's Chart, lab work & pertinent test results  Airway Mallampati: III  TM Distance: >3 FB Neck ROM: full    Dental  (+) Chipped   Pulmonary shortness of breath (for over a year, followed by Pulmonology), with exertion and at rest, asthma , COPD, former smoker,           Cardiovascular hypertension, (-) angina+ CAD  (-) Past MI (-) dysrhythmias   10/25/18: normal NM perfusion study   Neuro/Psych CVA negative psych ROS   GI/Hepatic Neg liver ROS, GERD  Controlled,  Endo/Other  diabetes, Insulin DependentMorbid obesity  Renal/GU ESRFRenal disease     Musculoskeletal   Abdominal   Peds  Hematology  (+) Blood dyscrasia, anemia ,   Anesthesia Other Findings Past Medical History: No date: Adenomatous colon polyp No date: Allergic rhinitis No date: Asthma No date: Cancer [facility])     Comment:  kidney No date: COPD (chronic obstructive pulmonary disease) (HCC) No date: Diabetes mellitus type II No date: Diverticulosis of colon No date: Esophageal stricture No date: Fatty liver No date: Gastritis No date: GERD (gastroesophageal reflux disease) No date: Gout No date: History of chickenpox 2004: History of CVA (cerebrovascular accident) No date: Hyperlipidemia No date: Hypertension No date: Internal hemorrhoids 11/2012: Kidney tumor 2004: Loss of smell     Comment:  due to CVA No date: Periumbilical hernia No date: Renal insufficiency No date: Umbilical hernia  Past Surgical History: No date: ABDOMINAL HYSTERECTOMY No date: ABDOMINAL HYSTERECTOMY No date: APPENDECTOMY No date: CESAREAN SECTION     Comment:  x 2 07/23/2015: COLONOSCOPY WITH PROPOFOL; N/A     Comment:  Procedure: COLONOSCOPY WITH PROPOFOL;  Surgeon: Manya Silvas, MD;  Location: [place];  Service:               Endoscopy;  Laterality: N/A; 07/23/2015: ESOPHAGOGASTRODUODENOSCOPY (EGD) WITH PROPOFOL; N/A     Comment:  Procedure: ESOPHAGOGASTRODUODENOSCOPY (EGD) WITH               PROPOFOL;  Surgeon: Manya Silvas, MD;  Location: [facility] : [facility]              ENDOSCOPY;  Service: Endoscopy;  Laterality: N/A; No date: HEMORROIDECTOMY No date: KIDNEY SURGERY  BMI    Body Mass Index: 36.67 kg/m      Reproductive/Obstetrics negative OB ROS                           Anesthesia Physical Anesthesia Plan  ASA: III  Anesthesia Plan: General ETT   Post-op Pain Management:    Induction:   PONV Risk Score and Plan: Ondansetron, Dexamethasone and Treatment may vary due to age or medical condition  Airway Management Planned:   Additional Equipment:   Intra-op Plan:   Post-operative Plan:   Informed Consent: I have reviewed the patients History and Physical, chart, labs and discussed the procedure including the risks, benefits and alternatives for the proposed anesthesia with the patient or authorized representative who has indicated his/her understanding and acceptance.     Dental Advisory Given  Plan Discussed with: Anesthesiologist  Anesthesia Plan Comments:        Anesthesia Quick Evaluation

## 2019-01-31 NOTE — Progress Notes (Signed)
PROGRESS NOTE                                                                                                                                                                                                             Patient Demographics:    Megan [name], is a 79 y.o. female, DOB - [birth year], KGM:[id number]  Admit date - 01/28/2019   Admitting Physician Loletha Grayer, MD  Outpatient Primary MD for the patient is Tracie Harrier, MD  LOS - 3    Chief Complaint  Patient presents with  . Nausea  . 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  She had an episode of diarrhea and diaphoresis. Her Lasix dose was increased recently due to leg swellings.  In the ED she was hyperkalemic with potassium of 6.6 and creatinine of 4.4.  Also found to have metabolic acidosis.  Given calcium gluconate, IV insulin and sodium bicarb. Admitted for further management.   Subjective:  Was lying on bed this morning and was asking how her kidney function is.  Awaiting peritoneal dialysis catheter placement this afternoon.  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  Associated metabolic acidosis and uremia.  On multiple medication including Lasix, lisinopril, HCTZ and Aldactone all have been held.  Received gentle hydration. Nephrology following and recommends peritoneal dialysis. -Patient is to receive peritoneal dialysis catheter at this afternoon by vascular surgery  -Patient is to receive her first round of peritoneal  dialysis tomorrow morning per nephrology -Appreciate nephrology care   Active problems Hyperkalemia--resolved  Received calcium gluconate, bicarb, insulin with dextrose.  Potassium now stable.  Continue Lokelma.  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  History of stroke-acute issue Continue Plavix and statin  Essential hypertension Stable.   - Multiple home meds including bisoprolol, amlodipine, lisinopril, Lasix and Aldactone.  Continue amlodipine only.Marland KitchenRest held due to advanced renal failure and hyperkalemia. -.  Bisoprolol 10 mg daily -Resume slowly when a stable  Secondary hyperparathyroidism On calcitriol.  Chronic gout Continue Uloric.  Epigastric and bilateral upper quadrant pain and tenderness. LFTs normal.  Ultrasound abdomen shows hepatic steatosis.  Symptoms currently resolved. -Follow-up outpatient  Code Status : Full code  Family Communication  : None  Disposition Plan  : Home possibly next 48 hours after PD catheter placed tomorrow.  Barriers For Discharge : Active symptoms, PD catheter placement on 1/13  Consults  : Nephrology, vascular surgery  Procedures  : CT head, ultrasound abdomen  DVT Prophylaxis  : Subcu heparin  Lab Results  Component Value Date   PLT 245 01/31/2019    Antibiotics  :    Anti-infectives (From admission, onward)   Start     Dose/Rate Route Frequency Ordered Stop   01/31/19 1405  ceFAZolin (ANCEF) 1-4 GM/50ML-% IVPB    Note to Pharmacy: Nyra Jabs   : cabinet override      01/31/19 1405 01/31/19 1527   01/31/19 0900  [MAR Hold]  ceFAZolin (ANCEF) IVPB 1 g/50 mL premix     (MAR Hold since Wed 01/31/2019 at 1357.Hold Reason: Transfer to a Procedural area.)  Note to Pharmacy: Send with patient to OR   1 g 100 mL/hr over 30 Minutes Intravenous On call 01/30/19 1245 01/31/19 1524   01/31/19 0803  ceFAZolin (ANCEF) 1-4 GM/50ML-% IVPB   Status:  Discontinued    Note to Pharmacy: Lawerance Sabal   : cabinet override      01/31/19 0803 01/31/19 0838        Objective:   Vitals:   01/31/19 1353 01/31/19 1613 01/31/19 1624 01/31/19 1629  BP: (!) 149/47 (!) 151/59  (!) 167/58  Pulse: 64 64 (!) 59 62  Resp: 17 20 15 14   Temp: 98.3 F (36.8 C) 97.7 F (36.5 C)    TempSrc: Tympanic     SpO2: 100% 98% 97% 95%  Weight: 93.9 kg     Height: 5\' 3"  (1.6 m)       Wt Readings from Last 3 Encounters:  01/31/19 93.9 kg  01/16/18 97.9 kg  12/26/17 101.2 kg     Intake/Output Summary (Last 24 hours) at 01/31/2019 1639 Last data filed at 01/31/2019 1611 Gross per 24 hour  Intake 1200 ml  Output --  Net 1200 ml    Physical exam Not in distress HEENT: Moist mucosa, supple neck Chest: Clear CVS: Normal S1-S2 GI: Soft, nondistended, no epigastric tenderness, bowel sounds present Musculoskeletal: Warm, no edema     Data Review:    CBC Recent Labs  Lab 01/28/19 1401 01/29/19 0530 01/31/19 0632  WBC 11.3* 7.4 7.8  HGB 12.1 11.1* 10.7*  HCT 37.6 34.5* 33.6*  PLT 276 232 245  MCV 90.6 90.1 90.1  MCH 29.2 29.0 28.7  MCHC 32.2 32.2 31.8  RDW 14.6 14.6 14.4    Chemistries  Recent Labs  Lab 01/28/19 1401 01/28/19 1406 01/28/19 2003 01/29/19 0530 01/30/19 0611 01/31/19 0632  NA 132*  --  135 137 140 141  K 6.6*  --  5.9* 5.3* 4.3 3.9  CL 103  --  106 107 105 104  CO2 18*  --  19* 20* 25 27  GLUCOSE 191*  --  271* 153* 168* 179*  BUN 82*  --  81* 76* 63* 45*  CREATININE 4.40*  --  4.13* 3.98* 3.34* 2.69*  CALCIUM 9.4  --  8.7* 9.0 8.5* 9.0  MG  --   --   --   --   --  1.7  AST  --  20  --   --   --   --   ALT  --  14  --   --   --   --   ALKPHOS  --  64  --   --   --   --   BILITOT  --  1.0  --   --   --   --    ------------------------------------------------------------------------------------------------------------------ No results for input(s): CHOL, HDL, LDLCALC, TRIG, CHOLHDL, LDLDIRECT in  the last 72 hours.  Lab Results  Component Value Date   HGBA1C 7.1 (H) 01/29/2019   ------------------------------------------------------------------------------------------------------------------ No results for input(s): TSH, T4TOTAL, T3FREE, THYROIDAB in the last 72 hours.  Invalid input(s): FREET3 ------------------------------------------------------------------------------------------------------------------ No results for input(s): VITAMINB12, FOLATE, FERRITIN, TIBC, IRON, RETICCTPCT in the last 72 hours.  Coagulation profile Recent Labs  Lab 01/31/19 0632  INR 1.0    No results for input(s): DDIMER in the last 72 hours.  Cardiac Enzymes No results for input(s): CKMB, TROPONINI, MYOGLOBIN in the last 168 hours.  Invalid input(s): CK ------------------------------------------------------------------------------------------------------------------    Component Value Date/Time   BNP 53.0 01/28/2019 1401    Inpatient Medications  Scheduled Meds: . [MAR Hold] allopurinol  50 mg Oral Daily  . [MAR Hold] amLODipine  10 mg Oral Daily  . [MAR Hold] atorvastatin  40 mg Oral Daily  . [MAR Hold] bisoprolol  10 mg Oral Daily  . [MAR Hold] calcitRIOL  0.25 mcg Oral Daily  . [MAR Hold] Chlorhexidine Gluconate Cloth  6 each Topical Daily  . [MAR Hold] clopidogrel  75 mg Oral Daily  . heparin      . [MAR Hold] heparin  5,000 Units Subcutaneous Q8H  . [MAR Hold] insulin aspart  0-15 Units Subcutaneous TID WC  . [MAR Hold] insulin aspart  0-5 Units Subcutaneous QHS  . [MAR Hold] pantoprazole  40 mg Oral Daily  . [MAR Hold] sodium zirconium cyclosilicate  10 g Oral BID  . [MAR Hold] umeclidinium-vilanterol  1 puff Inhalation Daily   Continuous Infusions: . sodium bicarbonate 150 mEq in dextrose 5% 1000 mL 150 mEq (01/30/19 1146)   PRN Meds:.[MAR Hold] acetaminophen **OR** [MAR Hold] acetaminophen, [MAR Hold] albuterol, fentaNYL (SUBLIMAZE) injection, [MAR Hold] labetalol,  [MAR Hold] ondansetron **OR** [MAR Hold] ondansetron (ZOFRAN) IV, [MAR Hold] zolpidem  Micro Results Recent Results (from the past 240 hour(s))  Respiratory Panel by RT PCR (Flu A&B, Covid) - Nasopharyngeal Swab     Status: None   Collection Time: 01/28/19  4:18 PM   Specimen: Nasopharyngeal Swab  Result Value Ref Range Status   SARS Coronavirus 2 by RT PCR NEGATIVE NEGATIVE Final    Comment: (NOTE) SARS-CoV-2 target nucleic acids are NOT DETECTED. The SARS-CoV-2 RNA is generally detectable in upper respiratoy specimens during the acute phase of infection. The lowest concentration of SARS-CoV-2 viral copies this assay can detect is 131 copies/mL. A negative result does not preclude SARS-Cov-2 infection and should not be used as the sole basis for treatment or other patient management decisions. A negative result may occur with  improper specimen collection/handling, submission of specimen other than nasopharyngeal swab, presence of viral mutation(s) within the areas targeted by this assay, and inadequate number of viral copies (<131 copies/mL). A negative result must be combined with clinical observations, patient history, and epidemiological information. The expected result is Negative. Fact Sheet for Patients:  PinkCheek.be Fact Sheet for Healthcare Providers:  GravelBags.it This test is not yet ap proved or cleared  by the [place] and  has been authorized for detection and/or diagnosis of SARS-CoV-2 by FDA under an Emergency Use Authorization (EUA). This EUA will remain  in effect (meaning this test can be used) for the duration of the COVID-19 declaration under Section 564(b)(1) of the Act, 21 U.S.C. section 360bbb-3(b)(1), unless the authorization is terminated or revoked sooner.    Influenza A by PCR NEGATIVE NEGATIVE Final   Influenza B by PCR NEGATIVE NEGATIVE Final    Comment: (NOTE) The Xpert Xpress  SARS-CoV-2/FLU/RSV assay is intended as an aid in  the diagnosis of influenza from Nasopharyngeal swab specimens and  should not be used as a sole basis for treatment. Nasal washings and  aspirates are unacceptable for Xpert Xpress SARS-CoV-2/FLU/RSV  testing. Fact Sheet for Patients: PinkCheek.be Fact Sheet for Healthcare Providers: GravelBags.it This test is not yet approved or cleared by the [place] FDA and  has been authorized for detection and/or diagnosis of SARS-CoV-2 by  FDA under an Emergency Use Authorization (EUA). This EUA will remain  in effect (meaning this test can be used) for the duration of the  Covid-19 declaration under Section 564(b)(1) of the Act, 21  U.S.C. section 360bbb-3(b)(1), unless the authorization is  terminated or revoked. Performed at [facility], [address] [place], [place] [address]     Radiology Reports CT HEAD WO CONTRAST  Result Date: 01/28/2019 CLINICAL DATA:  Headache EXAM: CT HEAD WITHOUT CONTRAST TECHNIQUE: Contiguous axial images were obtained from the base of the skull through the vertex without intravenous contrast. COMPARISON:  CT head dated 11/22/2013. FINDINGS: Brain: No evidence of acute infarction, hemorrhage, hydrocephalus, extra-axial collection or mass lesion/mass effect. There is a chronic infarct of the left corona radiata. Periventricular white matter hypoattenuation likely represents chronic small vessel ischemic disease. Vascular: There are vascular calcifications in the carotid siphons. Skull: Normal. Negative for fracture or focal lesion. Sinuses/Orbits: No acute finding. Other: None. IMPRESSION: 1. No acute intracranial process. Electronically Signed   By: Zerita Boers M.D.   On: 01/28/2019 16:21   US Abdomen Complete  Result Date: 01/30/2019 CLINICAL DATA:  Epigastric pain. Personal history partial nephrectomy on the left. EXAM: ABDOMEN ULTRASOUND  COMPLETE COMPARISON:  Renal ultrasound 09/21/2016. Abdominal ultrasound 08/21/2008. FINDINGS: Gallbladder: No gallstones or wall thickening visualized. No sonographic Murphy sign noted by sonographer. Common bile duct: Diameter: 3.5 mm, within normal limits. Liver: The liver is diffusely echogenic. No discrete lesions are present. Findings are similar the prior study. Portal vein is patent on color Doppler imaging with normal direction of blood flow towards the liver. IVC: No abnormality visualized. Pancreas: Visualized portion unremarkable. Spleen: Size and appearance within normal limits. Right Kidney: Length: 9.8 cm. There is progressive thinning of the renal parenchyma. The parenchyma is diffusely echogenic. No discrete lesions are present. Left Kidney: Length: 8.9 cm. There is progressive thinning of the renal parenchyma. The parenchyma is echogenic. No discrete lesions are present. Abdominal aorta: 2.4 cm, within limits Other findings: None. IMPRESSION: 1. Increased echogenicity of the renal parenchyma. This is nonspecific, but seen in the setting medical renal disease. 2. Progressive thinning of the renal parenchyma. 3. Hepatic steatosis. 4. No acute or other focal lesion to explain the patient's abdominal pain. Electronically Signed   By: San Morelle M.D.   On: 01/30/2019 09:14   DG Chest Port 1 View  Result Date: 01/28/2019 CLINICAL DATA:  Shortness of breath. EXAM: PORTABLE CHEST 1 VIEW COMPARISON:  December 29, 2017 FINDINGS: The  heart size is mildly enlarged but stable. The hila and mediastinum are normal. No pneumothorax. No nodules or masses. No focal infiltrates or overt edema. IMPRESSION: No active disease. Electronically Signed   By: Dorise Bullion III M.D   On: 01/28/2019 16:17    Time Spent in minutes 35   Thornell Mule M.D on 01/31/2019 at 4:39 PM  Between 7am to 7pm - Pager - [phone]  After 7pm go to [email or web address] - password [email or web address]  [facility] -  Office   [phone]

## 2019-01-31 NOTE — Anesthesia Procedure Notes (Signed)
Procedure Name: Intubation Date/Time: 01/31/2019 3:26 PM Performed by: Nelda Marseille, CRNA Pre-anesthesia Checklist: Patient identified, Patient being monitored, Timeout performed, Emergency Drugs available and Suction available Patient Re-evaluated:Patient Re-evaluated prior to induction Oxygen Delivery Method: Circle system utilized Preoxygenation: Pre-oxygenation with 100% oxygen Induction Type: IV induction Ventilation: Mask ventilation without difficulty Laryngoscope Size: Mac, 3 and McGraph Grade View: Grade III Tube type: Oral Tube size: 7.0 mm Number of attempts: 1 Airway Equipment and Method: Stylet Placement Confirmation: ETT inserted through vocal cords under direct vision,  positive ETCO2 and breath sounds checked- equal and bilateral Secured at: 21 cm Tube secured with: Tape Dental Injury: Teeth and Oropharynx as per pre-operative assessment

## 2019-01-31 NOTE — H&P (Signed)
[facility] History & Physical Update  The patient was interviewed and re-examined.  The patient's previous History and Physical has been reviewed and is unchanged.  There is no change in the plan of care. We plan to proceed with the scheduled procedure.  Leotis Pain, MD  01/31/2019, 2:45 PM

## 2019-01-31 NOTE — Op Note (Signed)
  OPERATIVE NOTE   PROCEDURE: Laparoscopic peritoneal dialysis catheter placement and laparoscopic lysis of adhesions  PRE-OPERATIVE DIAGNOSIS: 1.  Renal failure requiring dialysis, previous abdominal surgery with umbilical hernia   POST-OPERATIVE DIAGNOSIS: Same  SURGEON: Leotis Pain, MD  ASSISTANT(S): None  ANESTHESIA: general  ESTIMATED BLOOD LOSS: Minimal   FINDING(S): 1. None  SPECIMEN(S): None  INDICATIONS:  Patient presents with renal failure. The patient has decided to do peritoneal dialysis for her long-term dialysis. Risks and benefits of placement were discussed and he is agreeable to proceed.  Differences between peritoneal dialysis and hemodialysis were discussed.    DESCRIPTION: After obtaining full informed written consent, the patient was brought back to the operating room and placed supine upon the operating table. The patient received IV antibiotics prior to induction. After obtaining adequate anesthesia, the abdomen was prepped and draped in the standard fashion. A small transverse incision was created just to the left and several centimeters above the umbilicus and we dissected down to the fascia but at this point even going well above the umbilicus and trying to avoid the umbilical hernia, the large hernia sac was present.  I felt it would be in her best interest to go ahead and enter the abdominal cavity to evaluate the abdominal wall adhesions and the hernia. I then entered the peritoneum with an 10mm Optiview trocar placed in the right upper quadrant and insufflated the abdomen with carbon dioxide. I then entered the peritoneum in the left lower quadrant with a 5 mm trocar and used blunt graspers to take down abdominal wall adhesions.  The adhesions were fairly extensive.  She also had adhesions into her pelvis from her previous hysterectomy.  The adhesions were freed to the point where a catheter could be placed into the pelvis, but the periumbilical space was  still filled with hernia and hernia sac and this was clearly not going to be the best place to have the catheter entered.  I upsized to a 10 mm trocar in the left lower quadrant placed the peritoneal dialysis catheter through the trocar and into the pelvis.  The deep cuff was parked just above the fascial edge.  A counterincision was created about 6 to 8 cm superior to this catheter was tunneled out with the superficial cuff between the 2 incisions.  An additional 5 mm trocar was then placed in the curled portion of the catheter was grasped with a blunt grasper. The coiled portion of the catheter was parked into the pelvis under direct laparoscopic guidance. The appropriate distal connectors were placed, and I then placed 500 cc of saline through the catheter into the pelvis. The abdomen was desufflated. Immediately, between 350 and 400 cc of effluent returned through the catheter when the bag was placed to gravity. I took one more look with the camera to ensure that the catheter was in the pelvis and it was. The 57mm trocar was then removed.  The 5 mm trocar was removed.  I then closed the incisions with 3-0 Vicryl and 4-0 Monocryl and placed Dermabond as dressing. Dry dressing was placed around the catheter exit site. The patient was then awakened from anesthesia and taken to the recovery room in stable condition having tolerated the procedure well.  COMPLICATIONS: None  CONDITION: None  Leotis Pain, MD 01/31/2019 4:01 PM   This note was created with Dragon Medical transcription system. Any errors in dictation are purely unintentional.

## 2019-02-01 LAB — GLUCOSE, CAPILLARY: Glucose-Capillary: 215 mg/dL — ABNORMAL HIGH (ref 70–99)

## 2019-02-01 MED ORDER — HEPARIN SODIUM (PORCINE) 5000 UNIT/ML IJ SOLN
INTRAMUSCULAR | Status: AC
  Administered 2019-02-01: 5000 [IU] via SUBCUTANEOUS
  Filled 2019-02-01: qty 1

## 2019-02-01 MED ORDER — ACETAMINOPHEN 325 MG PO TABS: 650.0000 mg | ORAL_TABLET | Freq: Four times a day (QID) | ORAL | 0 refills | Status: DC | PRN

## 2019-02-01 MED ORDER — PSYLLIUM 95 % PO PACK: 1.0000 | PACK | Freq: Every day | ORAL | 0 refills | Status: DC

## 2019-02-01 MED ORDER — INSULIN ASPART 100 UNIT/ML ~~LOC~~ SOLN
SUBCUTANEOUS | Status: AC
  Administered 2019-02-01: 5 [IU] via SUBCUTANEOUS
  Filled 2019-02-01: qty 1

## 2019-02-01 MED ORDER — ONDANSETRON HCL 4 MG PO TABS: 4.0000 mg | ORAL_TABLET | Freq: Four times a day (QID) | ORAL | 0 refills | Status: DC | PRN

## 2019-02-01 MED ORDER — PSYLLIUM 95 % PO PACK
1.0000 | PACK | Freq: Every day | ORAL | Status: DC
  Administered 2019-02-01: 1 via ORAL
  Filled 2019-02-01: qty 1

## 2019-02-01 MED ORDER — BISOPROLOL FUMARATE 10 MG PO TABS: 10.0000 mg | ORAL_TABLET | Freq: Every day | ORAL | 0 refills | Status: DC

## 2019-02-01 MED ORDER — SODIUM ZIRCONIUM CYCLOSILICATE 10 G PO PACK: 10.0000 g | PACK | Freq: Two times a day (BID) | ORAL | 0 refills | Status: DC

## 2019-02-01 NOTE — Discharge Summary (Signed)
Physician Discharge Summary  Patient ID: Megan [name] MRN: [id number] DOB/AGE: [birth year] 79 y.o.  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  She had an episode of diarrhea and diaphoresis. Her Lasix dose was increased recently due to leg swellings.  In the ED she was hyperkalemic with potassium of 6.6 and creatinine of 4.4.  Also found to have metabolic acidosis.  Given calcium gluconate, IV insulin and sodium bicarb. Admitted for further management.  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 - On multiple medication including Lasix, lisinopril, HCTZ and Aldactone all have been held.  Continue to hold on discharge -  Received gentle hydration. - Nephrology consulted and recommended peritoneal dialysis -Patient received peritoneal dialysis catheter on 01/31/2019.  Tolerated the procedure well and dialysis catheter is patent. -Patient is to receive her first round of peritoneal dialysis on 02/02/2019 per nephrology.  Hyperkalemia--resolved  Received calcium gluconate, bicarb, insulin with dextrose.  Potassium now stable.  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   - Multiple home meds  including bisoprolol, amlodipine, lisinopril, Lasix and Aldactone.  Continue amlodipine only and bisoprolol .Marland KitchenRest held due to advanced renal failure and hyperkalemia; tinea to hold on discharge -Blood pressure daily  Secondary hyperparathyroidism On calcitriol.  Chronic gout Continue Uloric.  Epigastric and bilateral upper quadrant pain and tenderness. --Resolved LFTs normal.  Ultrasound abdomen shows hepatic steatosis.  S -Follow-up outpatient  Consults: nephrology  Significant Diagnostic Studies: Radiology and blood work  Treatments: Hospital course and discharge med list  Discharge Exam: Blood pressure (!) 152/57, pulse (!) 58, temperature 99.2 F (37.3 C), temperature source Tympanic, resp. rate 16, height 5\' 3"  (1.6 m), weight 93.9 kg, SpO2 97 %.  Not in distress HEENT: Moist mucosa, supple neck Chest: Clear CVS: Normal S1-S2 GI: Soft, nondistended, no epigastric tenderness, bowel sounds present Musculoskeletal: Warm, no edema  Disposition: Discharge disposition: 01-Home or Self Care     Signs and symptoms explained to patient when she must seek immediate medical attention.  Patient expressed understanding. Advised to follow-up with PCP  Discharge Instructions    Call MD for:  difficulty breathing, headache or visual disturbances   Complete by: As directed    Call MD for:  persistant dizziness or light-headedness   Complete by: As directed    Diet - low sodium heart healthy   Complete by: As directed    Renal Diet   Increase activity slowly   Complete by: As directed      Allergies as of 02/01/2019      Reactions   Crestor [rosuvastatin]    myopathy   Iodinated Diagnostic Agents Other (See Comments)   Nausea and SOB per patient Nausea and SOB per patient   Pioglitazone    REACTION: weight gain   Sulfa Antibiotics Hives   REACTION: bLISTERS  Sulfonamide Derivatives    REACTION: bLISTERS   Losartan Swelling   Sulfasalazine Hives   REACTION:  bLISTERS      Medication List    STOP taking these medications   furosemide 40 MG tablet Commonly known as: LASIX   lisinopril 5 MG tablet Commonly known as: ZESTRIL   spironolactone 25 MG tablet Commonly known as: ALDACTONE     TAKE these medications   acetaminophen 325 MG tablet Commonly known as: TYLENOL Take 2 tablets (650 mg total) by mouth every 6 (six) hours as needed for mild pain (or Fever >/= 101).   albuterol 108 (90 Base) MCG/ACT inhaler Commonly known as: VENTOLIN HFA 2 puffs every 6 hours as needed for wheeze/ cough   allopurinol 100 MG tablet Commonly known as: ZYLOPRIM Take 100 mg by mouth daily.   amLODipine 10 MG tablet Commonly known as: NORVASC Take 10 mg by mouth daily.   Anoro Ellipta 62.5-25 MCG/INH Aepb Generic drug: umeclidinium-vilanterol Inhale 1 puff into the lungs daily.   atorvastatin 40 MG tablet Commonly known as: LIPITOR Take 40 mg by mouth daily.   bisoprolol 10 MG tablet Commonly known as: ZEBETA Take 1 tablet (10 mg total) by mouth daily. Start taking on: February 02, 2019 What changed:   medication strength  how much to take   calcitRIOL 0.25 MCG capsule Commonly known as: ROCALTROL TAKE 1 CAPSULE BY MOUTH THREE TIMES A WEEK   ergocalciferol 1.25 MG (50000 UT) capsule Commonly known as: VITAMIN D2 Take 50,000 Units by mouth every 30 (thirty) days.   hyoscyamine 0.125 MG SL tablet Commonly known as: LEVSIN SL Place 0.125 mg under the tongue every 6 (six) hours as needed.   insulin aspart 100 UNIT/ML injection Commonly known as: NovoLOG Inject 6 Units into the skin daily before supper. Pens What changed:   when to take this  additional instructions   ondansetron 4 MG tablet Commonly known as: ZOFRAN Take 1 tablet (4 mg total) by mouth every 6 (six) hours as needed for nausea.   pantoprazole 40 MG tablet Commonly known as: PROTONIX Take 40 mg by mouth daily.   Plavix 75 MG tablet Generic drug:  clopidogrel TAKE 1 TABLET BY MOUTH ONCE A DAY   pregabalin 50 MG capsule Commonly known as: LYRICA Take 1 capsule (50 mg total) by mouth as needed. What changed: when to take this   psyllium 95 % Pack Commonly known as: HYDROCIL/METAMUCIL Take 1 packet by mouth daily.   sodium zirconium cyclosilicate 10 g Pack packet Commonly known as: LOKELMA Take 10 g by mouth 2 (two) times daily.   Tyler Aas FlexTouch 200 UNIT/ML Sopn Generic drug: Insulin Degludec Inject 28 Units into the skin daily.   zolpidem 5 MG tablet Commonly known as: AMBIEN TAKE 1 TABLET BY MOUTH AT BEDTIME AS NEEDED      Follow-up Information    Hande, Cherlyn Labella, MD Follow up in 7 day(s).   Specialty: Internal Medicine Why: Please follow up in 5-7 days.  Contact information: [place] [address] [phone]        Kris Hartmann, NP Follow up in 1 week(s).   Specialty: Vascular Surgery Why: First post-op check. No studies. Contact information: [place] [address] [phone]           Signed: Thornell Mule 02/01/2019, 3:06 PM

## 2019-02-01 NOTE — Anesthesia Postprocedure Evaluation (Signed)
Anesthesia Post Note  Patient: Megan [name]  Procedure(s) Performed: LAPAROSCOPIC INSERTION CONTINUOUS AMBULATORY PERITONEAL DIALYSIS  (CAPD) CATHETER (N/A )  Patient location during evaluation: PACU Anesthesia Type: General Level of consciousness: awake and alert Pain management: pain level controlled Vital Signs Assessment: post-procedure vital signs reviewed and stable Respiratory status: spontaneous breathing, nonlabored ventilation, respiratory function stable and patient connected to nasal cannula oxygen Cardiovascular status: blood pressure returned to baseline and stable Postop Assessment: no apparent nausea or vomiting Anesthetic complications: no     Last Vitals:  Vitals:   01/31/19 2029 02/01/19 0348  BP: (!) 168/67 (!) 166/77  Pulse: 61 63  Resp: 14 17  Temp: (!) 36.3 C 36.8 C  SpO2: 96% 96%    Last Pain:  Vitals:   02/01/19 0557  TempSrc:   PainSc: 0-No pain                 Precious Haws [name]

## 2019-02-01 NOTE — Care Management (Signed)
RN CM: Discussed case with dialysis navigator who is aware of patient and following. No other needs at this time.

## 2019-02-01 NOTE — Progress Notes (Signed)
[facility]  ROUNDING NOTE   Subjective:   PD catheter placed yesterday.   Objective:  Vital signs in last 24 hours:  Temp:  [97.3 F (36.3 C)-99.2 F (37.3 C)] 99.2 F (37.3 C) (01/14 0745) Pulse Rate:  [55-65] 58 (01/14 0745) Resp:  [12-20] 16 (01/14 0745) BP: (149-175)/(47-77) 152/57 (01/14 1020) SpO2:  [90 %-100 %] 97 % (01/14 0745) Weight:  [93.9 kg] 93.9 kg (01/13 1353)  Weight change:  Filed Weights   01/28/19 1357 01/29/19 0100 01/31/19 1353  Weight: 93.4 kg 93.9 kg 93.9 kg    Intake/Output: I/O last 3 completed shifts: In: 5188 [I.V.:1650; IV Piggyback:100] Out: -    Intake/Output this shift:  No intake/output data recorded.  Physical Exam: General: NAD, laying in bed  Head: Normocephalic, atraumatic. Moist oral mucosal membranes  Eyes: Anicteric, PERRL  Neck: Supple, trachea midline  Lungs:  Clear to auscultation  Heart: bradycardia  Abdomen:  Soft, nontender, obese  Extremities: trace peripheral edema.  Neurologic: Nonfocal, moving all four extremities  Skin: No lesions  Access: Peritoneal dialysis catheter 1/13 Dr. Lucky Cowboy    Basic Metabolic Panel: Recent Labs  Lab 01/28/19 1401 01/28/19 1401 01/28/19 2003 01/28/19 2003 01/29/19 0530 01/30/19 0611 01/31/19 0632  NA 132*  --  135  --  137 140 141  K 6.6*  --  5.9*  --  5.3* 4.3 3.9  CL 103  --  106  --  107 105 104  CO2 18*  --  19*  --  20* 25 27  GLUCOSE 191*  --  271*  --  153* 168* 179*  BUN 82*  --  81*  --  76* 63* 45*  CREATININE 4.40*  --  4.13*  --  3.98* 3.34* 2.69*  CALCIUM 9.4   < > 8.7*   < > 9.0 8.5* 9.0  MG  --   --   --   --   --   --  1.7  PHOS  --   --   --   --   --   --  2.8   < > = values in this interval not displayed.    Liver Function Tests: Recent Labs  Lab 01/28/19 1406 01/31/19 0632  AST 20  --   ALT 14  --   ALKPHOS 64  --   BILITOT 1.0  --   PROT 7.4  --   ALBUMIN 3.5 3.1*   Recent Labs  Lab 01/28/19 1406  LIPASE 44   No results  for input(s): AMMONIA in the last 168 hours.  CBC: Recent Labs  Lab 01/28/19 1401 01/29/19 0530 01/31/19 0632  WBC 11.3* 7.4 7.8  HGB 12.1 11.1* 10.7*  HCT 37.6 34.5* 33.6*  MCV 90.6 90.1 90.1  PLT 276 232 245    Cardiac Enzymes: Recent Labs  Lab 01/28/19 2003  CKTOTAL 74    BNP: Invalid input(s): POCBNP  CBG: Recent Labs  Lab 01/31/19 1403 01/31/19 1625 01/31/19 1832 01/31/19 2248 02/01/19 0747  GLUCAP 96 142* 168* 191* 215*    Microbiology: Results for orders placed or performed during the hospital encounter of 01/28/19  Respiratory Panel by RT PCR (Flu A&B, Covid) - Nasopharyngeal Swab     Status: None   Collection Time: 01/28/19  4:18 PM   Specimen: Nasopharyngeal Swab  Result Value Ref Range Status   SARS Coronavirus 2 by RT PCR NEGATIVE NEGATIVE Final    Comment: (NOTE) SARS-CoV-2 target nucleic acids are NOT  DETECTED. The SARS-CoV-2 RNA is generally detectable in upper respiratoy specimens during the acute phase of infection. The lowest concentration of SARS-CoV-2 viral copies this assay can detect is 131 copies/mL. A negative result does not preclude SARS-Cov-2 infection and should not be used as the sole basis for treatment or other patient management decisions. A negative result may occur with  improper specimen collection/handling, submission of specimen other than nasopharyngeal swab, presence of viral mutation(s) within the areas targeted by this assay, and inadequate number of viral copies (<131 copies/mL). A negative result must be combined with clinical observations, patient history, and epidemiological information. The expected result is Negative. Fact Sheet for Patients:  PinkCheek.be Fact Sheet for Healthcare Providers:  GravelBags.it This test is not yet ap proved or cleared by the [place] FDA and  has been authorized for detection and/or diagnosis of SARS-CoV-2 by FDA  under an Emergency Use Authorization (EUA). This EUA will remain  in effect (meaning this test can be used) for the duration of the COVID-19 declaration under Section 564(b)(1) of the Act, 21 U.S.C. section 360bbb-3(b)(1), unless the authorization is terminated or revoked sooner.    Influenza A by PCR NEGATIVE NEGATIVE Final   Influenza B by PCR NEGATIVE NEGATIVE Final    Comment: (NOTE) The Xpert Xpress SARS-CoV-2/FLU/RSV assay is intended as an aid in  the diagnosis of influenza from Nasopharyngeal swab specimens and  should not be used as a sole basis for treatment. Nasal washings and  aspirates are unacceptable for Xpert Xpress SARS-CoV-2/FLU/RSV  testing. Fact Sheet for Patients: PinkCheek.be Fact Sheet for Healthcare Providers: GravelBags.it This test is not yet approved or cleared by the [place] FDA and  has been authorized for detection and/or diagnosis of SARS-CoV-2 by  FDA under an Emergency Use Authorization (EUA). This EUA will remain  in effect (meaning this test can be used) for the duration of the  Covid-19 declaration under Section 564(b)(1) of the Act, 21  U.S.C. section 360bbb-3(b)(1), unless the authorization is  terminated or revoked. Performed at [facility], [address] [place], [place] [address]     Coagulation Studies: Recent Labs    01/31/19 0998  LABPROT 13.2  INR 1.0    Urinalysis: No results for input(s): COLORURINE, LABSPEC, PHURINE, GLUCOSEU, HGBUR, BILIRUBINUR, KETONESUR, PROTEINUR, UROBILINOGEN, NITRITE, LEUKOCYTESUR in the last 72 hours.  Invalid input(s): APPERANCEUR    Imaging: No results found.   Medications:   . sodium bicarbonate 150 mEq in dextrose 5% 1000 mL 150 mEq (01/31/19 1734)   . allopurinol  50 mg Oral Daily  . amLODipine  10 mg Oral Daily  . atorvastatin  40 mg Oral Daily  . bisoprolol  10 mg Oral Daily  . calcitRIOL  0.25 mcg Oral Daily   . Chlorhexidine Gluconate Cloth  6 each Topical Daily  . clopidogrel  75 mg Oral Daily  . heparin  5,000 Units Subcutaneous Q8H  . insulin aspart  0-15 Units Subcutaneous TID WC  . insulin aspart  0-5 Units Subcutaneous QHS  . pantoprazole  40 mg Oral Daily  . psyllium  1 packet Oral Daily  . sodium zirconium cyclosilicate  10 g Oral BID  . umeclidinium-vilanterol  1 puff Inhalation Daily   acetaminophen **OR** acetaminophen, albuterol, labetalol, ondansetron **OR** ondansetron (ZOFRAN) IV, zolpidem  Assessment/ Plan:  Ms. Megan [name] is a 79 y.o. black female with diabetes mellitus type II insulin dependent, hypertension, hyperlipidemia, renal cell carcinoma status post right nephrectomy, CVA, gout, COPD,  and GERD  1. 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  Secondary to diabetic nephropathy and partial nephrectomy This is progression of disease and will most likely benefit from initiating dialysis.  - start outpatient peritoneal dialysis training tomorrow at [facility].  Patient and family made aware.   2. Hypertension: Home regimen ofbisoprolol lisinopril, furosemide and spironolactone.    3. Secondary Hyperparathyroidism: PTH 245. Phos and calcium at goal. Not on binders.  4. Anemia of chronic kidney disease: hemoglobin at goal.   LOS: 4 Marylynn Rigdon 1/14/20211:25 PM

## 2019-02-01 NOTE — Discharge Instructions (Signed)
Chronic Kidney Disease, Adult Chronic kidney disease (CKD) happens when the kidneys are damaged over a long period of time. The kidneys are two organs that help with:  Getting rid of waste and extra fluid from the blood.  Making hormones that maintain the amount of fluid in your tissues and blood vessels.  Making sure that the body has the right amount of fluids and chemicals. Most of the time, CKD does not go away, but it can usually be controlled. Steps must be taken to slow down the kidney damage or to stop it from getting worse. If this is not done, the kidneys may stop working. Follow these instructions at home: Medicines  Take over-the-counter and prescription medicines only as told by your doctor. You may need to change the amount of medicines you take.  Do not take any new medicines unless your doctor says it is okay. Many medicines can make your kidney damage worse.  Do not take any vitamin and supplements unless your doctor says it is okay. Many vitamins and supplements can make your kidney damage worse. General instructions  Follow a diet as told by your doctor. You may need to stay away from: ? Alcohol. ? Salty foods. ? Foods that are high in:  Potassium.  Calcium.  Protein.  Do not use any products that contain nicotine or tobacco, such as cigarettes and e-cigarettes. If you need help quitting, ask your doctor.  Keep track of your blood pressure at home. Tell your doctor about any changes.  If you have diabetes, keep track of your blood sugar as told by your doctor.  Try to stay at a healthy weight. If you need help, ask your doctor.  Exercise at least 30 minutes a day, 5 days a week.  Stay up-to-date with your shots (immunizations) as told by your doctor.  Keep all follow-up visits as told by your doctor. This is important. Contact a doctor if:  Your symptoms get worse.  You have new symptoms. Get help right away if:  You have symptoms of end-stage  kidney disease. These may include: ? Headaches. ? Numbness in your hands or feet. ? Easy bruising. ? Having hiccups often. ? Chest pain. ? Shortness of breath. ? Stopping of menstrual periods in women.  You have a fever.  You have very little pee (urine).  You have pain or bleeding when you pee. Summary  Chronic kidney disease (CKD) happens when the kidneys are damaged over a long period of time.  Most of the time, this condition does not go away, but it can usually be controlled. Steps must be taken to slow down the kidney damage or to stop it from getting worse.  Treatment may include a combination of medicines and lifestyle changes. This information is not intended to replace advice given to you by your health care provider. Make sure you discuss any questions you have with your health care provider. Document Revised: 12/17/2016 Document Reviewed: 02/09/2016 Elsevier Patient Education  [place]. Hyperkalemia Hyperkalemia is when you have too much potassium in your blood. Potassium helps your body in many ways, but having too much can cause problems. If there is too much potassium in your blood, it can affect how your heart works. Potassium is normally removed from your body by your kidneys. Many things can cause the amount in your blood to be high. Medicines and other treatments can be used to bring the amount to a normal level. Treatment may need to be done  in the hospital. Follow these instructions at home:   Take over-the-counter and prescription medicines only as told by your doctor.  Do not take any of the following unless your doctor says it is okay: ? Supplements. ? Natural products. ? Herbs. ? Vitamins.  Limit how much alcohol you drink as told by your doctor.  Do not use drugs. If you need help quitting, ask your doctor.  If you have kidney disease, you may need to follow a low-potassium diet. A food specialist (dietitian) can help you.  Keep all  follow-up visits as told by your doctor. This is important. Contact a doctor if:  Your heartbeat is not regular or is very slow.  You feel dizzy (light-headed).  You feel weak.  You feel sick to your stomach (nauseous).  You have tingling in your hands or feet.  You lose feeling (have numbness) in your hands or feet. Get help right away if:  You are short of breath.  You have chest pain.  You pass out (faint).  You cannot move your muscles. Summary  Hyperkalemia is when you have too much potassium in your blood.  Take over-the-counter and prescription medicines only as told by your doctor.  Limit how much alcohol you drink as told by your doctor.  Contact a doctor if your heartbeat is not regular. This information is not intended to replace advice given to you by your health care provider. Make sure you discuss any questions you have with your health care provider. Document Revised: 12/20/2016 Document Reviewed: 12/20/2016 Elsevier Patient Education  [place].

## 2019-02-02 LAB — QUANTIFERON-TB GOLD PLUS: QuantiFERON-TB Gold Plus: NEGATIVE

## 2019-02-02 LAB — QUANTIFERON-TB GOLD PLUS (RQFGPL)
QuantiFERON Mitogen Value: >10 IU/mL
QuantiFERON Nil Value: 0.05 IU/mL
QuantiFERON TB1 Ag Value: 0.05 IU/mL
QuantiFERON TB2 Ag Value: 0.04 IU/mL

## 2019-02-05 DIAGNOSIS — Z79899 Other long term (current) drug therapy: Secondary | ICD-10-CM | POA: Diagnosis not present

## 2019-02-05 DIAGNOSIS — Z13220 Encounter for screening for lipoid disorders: Secondary | ICD-10-CM | POA: Diagnosis not present

## 2019-02-05 DIAGNOSIS — Z794 Long term (current) use of insulin: Secondary | ICD-10-CM | POA: Diagnosis not present

## 2019-02-05 DIAGNOSIS — E119 Type 2 diabetes mellitus without complications: Secondary | ICD-10-CM | POA: Diagnosis not present

## 2019-02-05 DIAGNOSIS — N186 End stage renal disease: Secondary | ICD-10-CM | POA: Diagnosis not present

## 2019-02-05 DIAGNOSIS — Z992 Dependence on renal dialysis: Secondary | ICD-10-CM | POA: Diagnosis not present

## 2019-02-06 DIAGNOSIS — N186 End stage renal disease: Secondary | ICD-10-CM | POA: Diagnosis not present

## 2019-02-06 DIAGNOSIS — Z992 Dependence on renal dialysis: Secondary | ICD-10-CM | POA: Diagnosis not present

## 2019-02-07 DIAGNOSIS — Z992 Dependence on renal dialysis: Secondary | ICD-10-CM | POA: Diagnosis not present

## 2019-02-07 DIAGNOSIS — N186 End stage renal disease: Secondary | ICD-10-CM | POA: Diagnosis not present

## 2019-02-08 DIAGNOSIS — I152 Hypertension secondary to endocrine disorders: Secondary | ICD-10-CM | POA: Diagnosis not present

## 2019-02-08 DIAGNOSIS — N186 End stage renal disease: Secondary | ICD-10-CM | POA: Diagnosis not present

## 2019-02-08 DIAGNOSIS — E1122 Type 2 diabetes mellitus with diabetic chronic kidney disease: Secondary | ICD-10-CM | POA: Diagnosis not present

## 2019-02-08 DIAGNOSIS — E1159 Type 2 diabetes mellitus with other circulatory complications: Secondary | ICD-10-CM | POA: Diagnosis not present

## 2019-02-08 DIAGNOSIS — Z992 Dependence on renal dialysis: Secondary | ICD-10-CM | POA: Diagnosis not present

## 2019-02-08 DIAGNOSIS — M545 Low back pain: Secondary | ICD-10-CM | POA: Diagnosis not present

## 2019-02-08 DIAGNOSIS — J209 Acute bronchitis, unspecified: Secondary | ICD-10-CM | POA: Diagnosis not present

## 2019-02-08 DIAGNOSIS — Z794 Long term (current) use of insulin: Secondary | ICD-10-CM | POA: Diagnosis not present

## 2019-02-08 DIAGNOSIS — J4540 Moderate persistent asthma, uncomplicated: Secondary | ICD-10-CM | POA: Diagnosis not present

## 2019-02-08 DIAGNOSIS — Z09 Encounter for follow-up examination after completed treatment for conditions other than malignant neoplasm: Secondary | ICD-10-CM | POA: Diagnosis not present

## 2019-02-09 DIAGNOSIS — Z992 Dependence on renal dialysis: Secondary | ICD-10-CM | POA: Diagnosis not present

## 2019-02-09 DIAGNOSIS — N186 End stage renal disease: Secondary | ICD-10-CM | POA: Diagnosis not present

## 2019-02-10 DIAGNOSIS — N186 End stage renal disease: Secondary | ICD-10-CM | POA: Diagnosis not present

## 2019-02-10 DIAGNOSIS — Z992 Dependence on renal dialysis: Secondary | ICD-10-CM | POA: Diagnosis not present

## 2019-02-11 DIAGNOSIS — N186 End stage renal disease: Secondary | ICD-10-CM | POA: Diagnosis not present

## 2019-02-11 DIAGNOSIS — Z992 Dependence on renal dialysis: Secondary | ICD-10-CM | POA: Diagnosis not present

## 2019-02-12 ENCOUNTER — Ambulatory Visit: Payer: Medicare HMO

## 2019-02-12 DIAGNOSIS — N186 End stage renal disease: Secondary | ICD-10-CM | POA: Diagnosis not present

## 2019-02-12 DIAGNOSIS — Z992 Dependence on renal dialysis: Secondary | ICD-10-CM | POA: Diagnosis not present

## 2019-02-13 DIAGNOSIS — Z992 Dependence on renal dialysis: Secondary | ICD-10-CM | POA: Diagnosis not present

## 2019-02-13 DIAGNOSIS — N186 End stage renal disease: Secondary | ICD-10-CM | POA: Diagnosis not present

## 2019-02-14 DIAGNOSIS — Z992 Dependence on renal dialysis: Secondary | ICD-10-CM | POA: Diagnosis not present

## 2019-02-14 DIAGNOSIS — N186 End stage renal disease: Secondary | ICD-10-CM | POA: Diagnosis not present

## 2019-02-15 DIAGNOSIS — Z992 Dependence on renal dialysis: Secondary | ICD-10-CM | POA: Diagnosis not present

## 2019-02-15 DIAGNOSIS — E1122 Type 2 diabetes mellitus with diabetic chronic kidney disease: Secondary | ICD-10-CM | POA: Diagnosis not present

## 2019-02-15 DIAGNOSIS — N186 End stage renal disease: Secondary | ICD-10-CM | POA: Diagnosis not present

## 2019-02-15 DIAGNOSIS — Z794 Long term (current) use of insulin: Secondary | ICD-10-CM | POA: Diagnosis not present

## 2019-02-15 DIAGNOSIS — N184 Chronic kidney disease, stage 4 (severe): Secondary | ICD-10-CM | POA: Diagnosis not present

## 2019-02-16 DIAGNOSIS — K420 Umbilical hernia with obstruction, without gangrene: Secondary | ICD-10-CM | POA: Diagnosis not present

## 2019-02-16 DIAGNOSIS — Z6837 Body mass index (BMI) 37.0-37.9, adult: Secondary | ICD-10-CM | POA: Diagnosis not present

## 2019-02-16 DIAGNOSIS — Z4902 Encounter for fitting and adjustment of peritoneal dialysis catheter: Secondary | ICD-10-CM | POA: Diagnosis not present

## 2019-02-16 DIAGNOSIS — T85611A Breakdown (mechanical) of intraperitoneal dialysis catheter, initial encounter: Secondary | ICD-10-CM | POA: Diagnosis not present

## 2019-02-16 DIAGNOSIS — Z7901 Long term (current) use of anticoagulants: Secondary | ICD-10-CM | POA: Diagnosis not present

## 2019-02-16 DIAGNOSIS — N186 End stage renal disease: Secondary | ICD-10-CM | POA: Diagnosis not present

## 2019-02-16 DIAGNOSIS — Z992 Dependence on renal dialysis: Secondary | ICD-10-CM | POA: Diagnosis not present

## 2019-02-19 ENCOUNTER — Ambulatory Visit: Admit: 2019-02-19 | Payer: Medicare HMO | Admitting: Internal Medicine

## 2019-02-19 SURGERY — EGD (ESOPHAGOGASTRODUODENOSCOPY)
Anesthesia: General

## 2019-02-20 DIAGNOSIS — R69 Illness, unspecified: Secondary | ICD-10-CM | POA: Diagnosis not present

## 2019-02-21 ENCOUNTER — Other Ambulatory Visit: Payer: Self-pay

## 2019-02-21 ENCOUNTER — Ambulatory Visit
Admission: RE | Admit: 2019-02-21 | Discharge: 2019-02-21 | Disposition: A | Discharge status: Home / Self Care | Payer: Medicare HMO | Source: Ambulatory Visit | Attending: Internal Medicine | Admitting: Internal Medicine

## 2019-02-21 DIAGNOSIS — R918 Other nonspecific abnormal finding of lung field: Secondary | ICD-10-CM | POA: Diagnosis not present

## 2019-02-21 DIAGNOSIS — R911 Solitary pulmonary nodule: Secondary | ICD-10-CM | POA: Diagnosis not present

## 2019-02-21 DIAGNOSIS — Z20822 Contact with and (suspected) exposure to covid-19: Secondary | ICD-10-CM | POA: Diagnosis not present

## 2019-02-21 DIAGNOSIS — Z01812 Encounter for preprocedural laboratory examination: Secondary | ICD-10-CM | POA: Diagnosis not present

## 2019-02-27 DIAGNOSIS — T85611A Breakdown (mechanical) of intraperitoneal dialysis catheter, initial encounter: Secondary | ICD-10-CM | POA: Diagnosis not present

## 2019-02-27 DIAGNOSIS — I120 Hypertensive chronic kidney disease with stage 5 chronic kidney disease or end stage renal disease: Secondary | ICD-10-CM | POA: Diagnosis not present

## 2019-02-27 DIAGNOSIS — E1122 Type 2 diabetes mellitus with diabetic chronic kidney disease: Secondary | ICD-10-CM | POA: Diagnosis not present

## 2019-02-27 DIAGNOSIS — K432 Incisional hernia without obstruction or gangrene: Secondary | ICD-10-CM | POA: Diagnosis not present

## 2019-02-27 DIAGNOSIS — T85898A Other specified complication of other internal prosthetic devices, implants and grafts, initial encounter: Secondary | ICD-10-CM | POA: Diagnosis not present

## 2019-02-27 DIAGNOSIS — Z4902 Encounter for fitting and adjustment of peritoneal dialysis catheter: Secondary | ICD-10-CM | POA: Diagnosis not present

## 2019-02-27 DIAGNOSIS — Z7902 Long term (current) use of antithrombotics/antiplatelets: Secondary | ICD-10-CM | POA: Diagnosis not present

## 2019-02-27 DIAGNOSIS — N186 End stage renal disease: Secondary | ICD-10-CM | POA: Diagnosis not present

## 2019-02-27 DIAGNOSIS — K660 Peritoneal adhesions (postprocedural) (postinfection): Secondary | ICD-10-CM | POA: Diagnosis not present

## 2019-02-27 DIAGNOSIS — Y831 Surgical operation with implant of artificial internal device as the cause of abnormal reaction of the patient, or of later complication, without mention of misadventure at the time of the procedure: Secondary | ICD-10-CM | POA: Diagnosis not present

## 2019-02-27 DIAGNOSIS — Z4901 Encounter for fitting and adjustment of extracorporeal dialysis catheter: Secondary | ICD-10-CM | POA: Diagnosis not present

## 2019-02-27 DIAGNOSIS — K430 Incisional hernia with obstruction, without gangrene: Secondary | ICD-10-CM | POA: Diagnosis not present

## 2019-03-01 DIAGNOSIS — N186 End stage renal disease: Secondary | ICD-10-CM | POA: Diagnosis not present

## 2019-03-01 DIAGNOSIS — Z992 Dependence on renal dialysis: Secondary | ICD-10-CM | POA: Diagnosis not present

## 2019-03-01 DIAGNOSIS — D509 Iron deficiency anemia, unspecified: Secondary | ICD-10-CM | POA: Diagnosis not present

## 2019-03-02 DIAGNOSIS — R52 Pain, unspecified: Secondary | ICD-10-CM | POA: Diagnosis not present

## 2019-03-02 DIAGNOSIS — N186 End stage renal disease: Secondary | ICD-10-CM | POA: Diagnosis not present

## 2019-03-02 DIAGNOSIS — I10 Essential (primary) hypertension: Secondary | ICD-10-CM | POA: Diagnosis not present

## 2019-03-02 DIAGNOSIS — R1084 Generalized abdominal pain: Secondary | ICD-10-CM | POA: Diagnosis not present

## 2019-03-02 DIAGNOSIS — Z992 Dependence on renal dialysis: Secondary | ICD-10-CM | POA: Diagnosis not present

## 2019-03-03 DIAGNOSIS — E1122 Type 2 diabetes mellitus with diabetic chronic kidney disease: Secondary | ICD-10-CM | POA: Diagnosis not present

## 2019-03-03 DIAGNOSIS — Z992 Dependence on renal dialysis: Secondary | ICD-10-CM | POA: Diagnosis not present

## 2019-03-03 DIAGNOSIS — Y838 Other surgical procedures as the cause of abnormal reaction of the patient, or of later complication, without mention of misadventure at the time of the procedure: Secondary | ICD-10-CM | POA: Diagnosis not present

## 2019-03-03 DIAGNOSIS — Z794 Long term (current) use of insulin: Secondary | ICD-10-CM | POA: Diagnosis not present

## 2019-03-03 DIAGNOSIS — K420 Umbilical hernia with obstruction, without gangrene: Secondary | ICD-10-CM | POA: Diagnosis not present

## 2019-03-03 DIAGNOSIS — R1084 Generalized abdominal pain: Secondary | ICD-10-CM | POA: Diagnosis not present

## 2019-03-03 DIAGNOSIS — K521 Toxic gastroenteritis and colitis: Secondary | ICD-10-CM | POA: Diagnosis not present

## 2019-03-03 DIAGNOSIS — R1010 Upper abdominal pain, unspecified: Secondary | ICD-10-CM | POA: Diagnosis not present

## 2019-03-03 DIAGNOSIS — J45909 Unspecified asthma, uncomplicated: Secondary | ICD-10-CM | POA: Diagnosis not present

## 2019-03-03 DIAGNOSIS — E119 Type 2 diabetes mellitus without complications: Secondary | ICD-10-CM | POA: Diagnosis not present

## 2019-03-03 DIAGNOSIS — R112 Nausea with vomiting, unspecified: Secondary | ICD-10-CM | POA: Diagnosis not present

## 2019-03-03 DIAGNOSIS — K56609 Unspecified intestinal obstruction, unspecified as to partial versus complete obstruction: Secondary | ICD-10-CM | POA: Diagnosis not present

## 2019-03-03 DIAGNOSIS — Z4682 Encounter for fitting and adjustment of non-vascular catheter: Secondary | ICD-10-CM | POA: Diagnosis not present

## 2019-03-03 DIAGNOSIS — I10 Essential (primary) hypertension: Secondary | ICD-10-CM | POA: Diagnosis not present

## 2019-03-03 DIAGNOSIS — R197 Diarrhea, unspecified: Secondary | ICD-10-CM | POA: Diagnosis not present

## 2019-03-03 DIAGNOSIS — N186 End stage renal disease: Secondary | ICD-10-CM | POA: Diagnosis not present

## 2019-03-03 DIAGNOSIS — D631 Anemia in chronic kidney disease: Secondary | ICD-10-CM | POA: Diagnosis not present

## 2019-03-03 DIAGNOSIS — L03311 Cellulitis of abdominal wall: Secondary | ICD-10-CM | POA: Diagnosis not present

## 2019-03-03 DIAGNOSIS — D649 Anemia, unspecified: Secondary | ICD-10-CM | POA: Diagnosis not present

## 2019-03-03 DIAGNOSIS — I120 Hypertensive chronic kidney disease with stage 5 chronic kidney disease or end stage renal disease: Secondary | ICD-10-CM | POA: Diagnosis not present

## 2019-03-03 DIAGNOSIS — R0602 Shortness of breath: Secondary | ICD-10-CM | POA: Diagnosis not present

## 2019-03-03 DIAGNOSIS — K219 Gastro-esophageal reflux disease without esophagitis: Secondary | ICD-10-CM | POA: Diagnosis not present

## 2019-03-03 DIAGNOSIS — K432 Incisional hernia without obstruction or gangrene: Secondary | ICD-10-CM | POA: Diagnosis not present

## 2019-03-03 DIAGNOSIS — K429 Umbilical hernia without obstruction or gangrene: Secondary | ICD-10-CM | POA: Diagnosis not present

## 2019-03-03 DIAGNOSIS — K913 Postprocedural intestinal obstruction, unspecified as to partial versus complete: Secondary | ICD-10-CM | POA: Diagnosis not present

## 2019-03-03 DIAGNOSIS — U071 COVID-19: Secondary | ICD-10-CM | POA: Diagnosis not present

## 2019-03-04 DIAGNOSIS — I10 Essential (primary) hypertension: Secondary | ICD-10-CM | POA: Diagnosis not present

## 2019-03-04 DIAGNOSIS — R1084 Generalized abdominal pain: Secondary | ICD-10-CM | POA: Diagnosis not present

## 2019-03-04 DIAGNOSIS — Z794 Long term (current) use of insulin: Secondary | ICD-10-CM | POA: Diagnosis not present

## 2019-03-04 DIAGNOSIS — Z992 Dependence on renal dialysis: Secondary | ICD-10-CM | POA: Diagnosis not present

## 2019-03-04 DIAGNOSIS — K56609 Unspecified intestinal obstruction, unspecified as to partial versus complete obstruction: Secondary | ICD-10-CM | POA: Diagnosis not present

## 2019-03-04 DIAGNOSIS — I120 Hypertensive chronic kidney disease with stage 5 chronic kidney disease or end stage renal disease: Secondary | ICD-10-CM | POA: Diagnosis not present

## 2019-03-04 DIAGNOSIS — N186 End stage renal disease: Secondary | ICD-10-CM | POA: Diagnosis not present

## 2019-03-04 DIAGNOSIS — D649 Anemia, unspecified: Secondary | ICD-10-CM | POA: Diagnosis not present

## 2019-03-04 DIAGNOSIS — J45909 Unspecified asthma, uncomplicated: Secondary | ICD-10-CM | POA: Diagnosis not present

## 2019-03-04 DIAGNOSIS — U071 COVID-19: Secondary | ICD-10-CM | POA: Diagnosis not present

## 2019-03-04 DIAGNOSIS — E1122 Type 2 diabetes mellitus with diabetic chronic kidney disease: Secondary | ICD-10-CM | POA: Diagnosis not present

## 2019-03-05 DIAGNOSIS — U071 COVID-19: Secondary | ICD-10-CM | POA: Diagnosis not present

## 2019-03-05 DIAGNOSIS — N186 End stage renal disease: Secondary | ICD-10-CM | POA: Diagnosis not present

## 2019-03-05 DIAGNOSIS — D649 Anemia, unspecified: Secondary | ICD-10-CM | POA: Diagnosis not present

## 2019-03-05 DIAGNOSIS — J45909 Unspecified asthma, uncomplicated: Secondary | ICD-10-CM | POA: Diagnosis not present

## 2019-03-05 DIAGNOSIS — K56609 Unspecified intestinal obstruction, unspecified as to partial versus complete obstruction: Secondary | ICD-10-CM | POA: Diagnosis not present

## 2019-03-05 DIAGNOSIS — I10 Essential (primary) hypertension: Secondary | ICD-10-CM | POA: Diagnosis not present

## 2019-03-05 DIAGNOSIS — E1122 Type 2 diabetes mellitus with diabetic chronic kidney disease: Secondary | ICD-10-CM | POA: Diagnosis not present

## 2019-03-05 DIAGNOSIS — K219 Gastro-esophageal reflux disease without esophagitis: Secondary | ICD-10-CM | POA: Diagnosis not present

## 2019-03-05 DIAGNOSIS — Z992 Dependence on renal dialysis: Secondary | ICD-10-CM | POA: Diagnosis not present

## 2019-03-05 DIAGNOSIS — R1010 Upper abdominal pain, unspecified: Secondary | ICD-10-CM | POA: Diagnosis not present

## 2019-03-05 DIAGNOSIS — K420 Umbilical hernia with obstruction, without gangrene: Secondary | ICD-10-CM | POA: Diagnosis not present

## 2019-03-05 DIAGNOSIS — I120 Hypertensive chronic kidney disease with stage 5 chronic kidney disease or end stage renal disease: Secondary | ICD-10-CM | POA: Diagnosis not present

## 2019-03-05 DIAGNOSIS — Z794 Long term (current) use of insulin: Secondary | ICD-10-CM | POA: Diagnosis not present

## 2019-03-06 DIAGNOSIS — Z794 Long term (current) use of insulin: Secondary | ICD-10-CM | POA: Diagnosis not present

## 2019-03-06 DIAGNOSIS — E119 Type 2 diabetes mellitus without complications: Secondary | ICD-10-CM | POA: Diagnosis not present

## 2019-03-06 DIAGNOSIS — J45909 Unspecified asthma, uncomplicated: Secondary | ICD-10-CM | POA: Diagnosis not present

## 2019-03-06 DIAGNOSIS — K56609 Unspecified intestinal obstruction, unspecified as to partial versus complete obstruction: Secondary | ICD-10-CM | POA: Diagnosis not present

## 2019-03-06 DIAGNOSIS — U071 COVID-19: Secondary | ICD-10-CM | POA: Diagnosis not present

## 2019-03-06 DIAGNOSIS — I10 Essential (primary) hypertension: Secondary | ICD-10-CM | POA: Diagnosis not present

## 2019-03-06 DIAGNOSIS — D649 Anemia, unspecified: Secondary | ICD-10-CM | POA: Diagnosis not present

## 2019-03-06 DIAGNOSIS — N186 End stage renal disease: Secondary | ICD-10-CM | POA: Diagnosis not present

## 2019-03-07 DIAGNOSIS — E119 Type 2 diabetes mellitus without complications: Secondary | ICD-10-CM | POA: Diagnosis not present

## 2019-03-07 DIAGNOSIS — Z794 Long term (current) use of insulin: Secondary | ICD-10-CM | POA: Diagnosis not present

## 2019-03-07 DIAGNOSIS — D649 Anemia, unspecified: Secondary | ICD-10-CM | POA: Diagnosis not present

## 2019-03-07 DIAGNOSIS — K56609 Unspecified intestinal obstruction, unspecified as to partial versus complete obstruction: Secondary | ICD-10-CM | POA: Diagnosis not present

## 2019-03-07 DIAGNOSIS — J45909 Unspecified asthma, uncomplicated: Secondary | ICD-10-CM | POA: Diagnosis not present

## 2019-03-07 DIAGNOSIS — U071 COVID-19: Secondary | ICD-10-CM | POA: Diagnosis not present

## 2019-03-07 DIAGNOSIS — N186 End stage renal disease: Secondary | ICD-10-CM | POA: Diagnosis not present

## 2019-03-07 DIAGNOSIS — I10 Essential (primary) hypertension: Secondary | ICD-10-CM | POA: Diagnosis not present

## 2019-03-08 DIAGNOSIS — R0602 Shortness of breath: Secondary | ICD-10-CM | POA: Diagnosis not present

## 2019-03-08 DIAGNOSIS — N186 End stage renal disease: Secondary | ICD-10-CM | POA: Diagnosis not present

## 2019-03-08 DIAGNOSIS — K56609 Unspecified intestinal obstruction, unspecified as to partial versus complete obstruction: Secondary | ICD-10-CM | POA: Diagnosis not present

## 2019-03-08 DIAGNOSIS — I10 Essential (primary) hypertension: Secondary | ICD-10-CM | POA: Diagnosis not present

## 2019-03-08 DIAGNOSIS — D649 Anemia, unspecified: Secondary | ICD-10-CM | POA: Diagnosis not present

## 2019-03-08 DIAGNOSIS — J45909 Unspecified asthma, uncomplicated: Secondary | ICD-10-CM | POA: Diagnosis not present

## 2019-03-08 DIAGNOSIS — E119 Type 2 diabetes mellitus without complications: Secondary | ICD-10-CM | POA: Diagnosis not present

## 2019-03-08 DIAGNOSIS — R197 Diarrhea, unspecified: Secondary | ICD-10-CM | POA: Diagnosis not present

## 2019-03-08 DIAGNOSIS — U071 COVID-19: Secondary | ICD-10-CM | POA: Diagnosis not present

## 2019-03-08 DIAGNOSIS — Z794 Long term (current) use of insulin: Secondary | ICD-10-CM | POA: Diagnosis not present

## 2019-03-09 DIAGNOSIS — I10 Essential (primary) hypertension: Secondary | ICD-10-CM | POA: Diagnosis not present

## 2019-03-09 DIAGNOSIS — K56609 Unspecified intestinal obstruction, unspecified as to partial versus complete obstruction: Secondary | ICD-10-CM | POA: Diagnosis not present

## 2019-03-09 DIAGNOSIS — D649 Anemia, unspecified: Secondary | ICD-10-CM | POA: Diagnosis not present

## 2019-03-09 DIAGNOSIS — E119 Type 2 diabetes mellitus without complications: Secondary | ICD-10-CM | POA: Diagnosis not present

## 2019-03-09 DIAGNOSIS — U071 COVID-19: Secondary | ICD-10-CM | POA: Diagnosis not present

## 2019-03-09 DIAGNOSIS — Z794 Long term (current) use of insulin: Secondary | ICD-10-CM | POA: Diagnosis not present

## 2019-03-09 DIAGNOSIS — R197 Diarrhea, unspecified: Secondary | ICD-10-CM | POA: Diagnosis not present

## 2019-03-09 DIAGNOSIS — J45909 Unspecified asthma, uncomplicated: Secondary | ICD-10-CM | POA: Diagnosis not present

## 2019-03-09 DIAGNOSIS — N186 End stage renal disease: Secondary | ICD-10-CM | POA: Diagnosis not present

## 2019-03-10 DIAGNOSIS — I10 Essential (primary) hypertension: Secondary | ICD-10-CM | POA: Diagnosis not present

## 2019-03-10 DIAGNOSIS — Z794 Long term (current) use of insulin: Secondary | ICD-10-CM | POA: Diagnosis not present

## 2019-03-10 DIAGNOSIS — K56609 Unspecified intestinal obstruction, unspecified as to partial versus complete obstruction: Secondary | ICD-10-CM | POA: Diagnosis not present

## 2019-03-10 DIAGNOSIS — N186 End stage renal disease: Secondary | ICD-10-CM | POA: Diagnosis not present

## 2019-03-10 DIAGNOSIS — D631 Anemia in chronic kidney disease: Secondary | ICD-10-CM | POA: Diagnosis not present

## 2019-03-10 DIAGNOSIS — J45909 Unspecified asthma, uncomplicated: Secondary | ICD-10-CM | POA: Diagnosis not present

## 2019-03-10 DIAGNOSIS — E119 Type 2 diabetes mellitus without complications: Secondary | ICD-10-CM | POA: Diagnosis not present

## 2019-03-13 DIAGNOSIS — I129 Hypertensive chronic kidney disease with stage 1 through stage 4 chronic kidney disease, or unspecified chronic kidney disease: Secondary | ICD-10-CM | POA: Diagnosis not present

## 2019-03-13 DIAGNOSIS — J449 Chronic obstructive pulmonary disease, unspecified: Secondary | ICD-10-CM | POA: Diagnosis not present

## 2019-03-13 DIAGNOSIS — Z8719 Personal history of other diseases of the digestive system: Secondary | ICD-10-CM | POA: Diagnosis not present

## 2019-03-13 DIAGNOSIS — K219 Gastro-esophageal reflux disease without esophagitis: Secondary | ICD-10-CM | POA: Diagnosis not present

## 2019-03-13 DIAGNOSIS — I700 Atherosclerosis of aorta: Secondary | ICD-10-CM | POA: Diagnosis not present

## 2019-03-13 DIAGNOSIS — R609 Edema, unspecified: Secondary | ICD-10-CM | POA: Diagnosis not present

## 2019-03-13 DIAGNOSIS — M109 Gout, unspecified: Secondary | ICD-10-CM | POA: Diagnosis not present

## 2019-03-13 DIAGNOSIS — Z9889 Other specified postprocedural states: Secondary | ICD-10-CM | POA: Diagnosis not present

## 2019-03-13 DIAGNOSIS — N184 Chronic kidney disease, stage 4 (severe): Secondary | ICD-10-CM | POA: Diagnosis not present

## 2019-03-13 DIAGNOSIS — E785 Hyperlipidemia, unspecified: Secondary | ICD-10-CM | POA: Diagnosis not present

## 2019-03-13 DIAGNOSIS — E1122 Type 2 diabetes mellitus with diabetic chronic kidney disease: Secondary | ICD-10-CM | POA: Diagnosis not present

## 2019-03-13 DIAGNOSIS — Z794 Long term (current) use of insulin: Secondary | ICD-10-CM | POA: Diagnosis not present

## 2019-03-13 DIAGNOSIS — R8290 Unspecified abnormal findings in urine: Secondary | ICD-10-CM | POA: Diagnosis not present

## 2019-03-13 DIAGNOSIS — Z09 Encounter for follow-up examination after completed treatment for conditions other than malignant neoplasm: Secondary | ICD-10-CM | POA: Diagnosis not present

## 2019-03-28 DIAGNOSIS — N2581 Secondary hyperparathyroidism of renal origin: Secondary | ICD-10-CM | POA: Diagnosis not present

## 2019-03-28 DIAGNOSIS — N184 Chronic kidney disease, stage 4 (severe): Secondary | ICD-10-CM | POA: Diagnosis not present

## 2019-03-28 DIAGNOSIS — N186 End stage renal disease: Secondary | ICD-10-CM | POA: Diagnosis not present

## 2019-03-28 DIAGNOSIS — D631 Anemia in chronic kidney disease: Secondary | ICD-10-CM | POA: Diagnosis not present

## 2019-03-28 DIAGNOSIS — R8290 Unspecified abnormal findings in urine: Secondary | ICD-10-CM | POA: Diagnosis not present

## 2019-03-28 DIAGNOSIS — I129 Hypertensive chronic kidney disease with stage 1 through stage 4 chronic kidney disease, or unspecified chronic kidney disease: Secondary | ICD-10-CM | POA: Diagnosis not present

## 2019-03-28 DIAGNOSIS — E875 Hyperkalemia: Secondary | ICD-10-CM | POA: Diagnosis not present

## 2019-03-28 DIAGNOSIS — E1122 Type 2 diabetes mellitus with diabetic chronic kidney disease: Secondary | ICD-10-CM | POA: Diagnosis not present

## 2019-03-28 DIAGNOSIS — E872 Acidosis: Secondary | ICD-10-CM | POA: Diagnosis not present

## 2019-04-05 DIAGNOSIS — Z992 Dependence on renal dialysis: Secondary | ICD-10-CM | POA: Diagnosis not present

## 2019-04-05 DIAGNOSIS — N186 End stage renal disease: Secondary | ICD-10-CM | POA: Diagnosis not present

## 2019-04-06 DIAGNOSIS — N186 End stage renal disease: Secondary | ICD-10-CM | POA: Diagnosis not present

## 2019-04-06 DIAGNOSIS — Z992 Dependence on renal dialysis: Secondary | ICD-10-CM | POA: Diagnosis not present

## 2019-04-09 DIAGNOSIS — N186 End stage renal disease: Secondary | ICD-10-CM | POA: Diagnosis not present

## 2019-04-09 DIAGNOSIS — Z992 Dependence on renal dialysis: Secondary | ICD-10-CM | POA: Diagnosis not present

## 2019-04-10 DIAGNOSIS — N186 End stage renal disease: Secondary | ICD-10-CM | POA: Diagnosis not present

## 2019-04-10 DIAGNOSIS — D509 Iron deficiency anemia, unspecified: Secondary | ICD-10-CM | POA: Diagnosis not present

## 2019-04-10 DIAGNOSIS — R69 Illness, unspecified: Secondary | ICD-10-CM | POA: Diagnosis not present

## 2019-04-10 DIAGNOSIS — Z992 Dependence on renal dialysis: Secondary | ICD-10-CM | POA: Diagnosis not present

## 2019-04-11 DIAGNOSIS — Z992 Dependence on renal dialysis: Secondary | ICD-10-CM | POA: Diagnosis not present

## 2019-04-11 DIAGNOSIS — N186 End stage renal disease: Secondary | ICD-10-CM | POA: Diagnosis not present

## 2019-04-12 DIAGNOSIS — Z992 Dependence on renal dialysis: Secondary | ICD-10-CM | POA: Diagnosis not present

## 2019-04-12 DIAGNOSIS — N186 End stage renal disease: Secondary | ICD-10-CM | POA: Diagnosis not present

## 2019-04-13 DIAGNOSIS — Z992 Dependence on renal dialysis: Secondary | ICD-10-CM | POA: Diagnosis not present

## 2019-04-13 DIAGNOSIS — N186 End stage renal disease: Secondary | ICD-10-CM | POA: Diagnosis not present

## 2019-04-16 DIAGNOSIS — N186 End stage renal disease: Secondary | ICD-10-CM | POA: Diagnosis not present

## 2019-04-16 DIAGNOSIS — Z992 Dependence on renal dialysis: Secondary | ICD-10-CM | POA: Diagnosis not present

## 2019-04-16 DIAGNOSIS — H40013 Open angle with borderline findings, low risk, bilateral: Secondary | ICD-10-CM | POA: Diagnosis not present

## 2019-04-17 DIAGNOSIS — N186 End stage renal disease: Secondary | ICD-10-CM | POA: Diagnosis not present

## 2019-04-17 DIAGNOSIS — Z992 Dependence on renal dialysis: Secondary | ICD-10-CM | POA: Diagnosis not present

## 2019-04-18 DIAGNOSIS — Z992 Dependence on renal dialysis: Secondary | ICD-10-CM | POA: Diagnosis not present

## 2019-04-18 DIAGNOSIS — N186 End stage renal disease: Secondary | ICD-10-CM | POA: Diagnosis not present

## 2019-04-19 DIAGNOSIS — E785 Hyperlipidemia, unspecified: Secondary | ICD-10-CM | POA: Diagnosis not present

## 2019-04-19 DIAGNOSIS — E1159 Type 2 diabetes mellitus with other circulatory complications: Secondary | ICD-10-CM | POA: Diagnosis not present

## 2019-04-19 DIAGNOSIS — E1122 Type 2 diabetes mellitus with diabetic chronic kidney disease: Secondary | ICD-10-CM | POA: Diagnosis not present

## 2019-04-19 DIAGNOSIS — N186 End stage renal disease: Secondary | ICD-10-CM | POA: Diagnosis not present

## 2019-04-19 DIAGNOSIS — E1169 Type 2 diabetes mellitus with other specified complication: Secondary | ICD-10-CM | POA: Diagnosis not present

## 2019-04-19 DIAGNOSIS — I10 Essential (primary) hypertension: Secondary | ICD-10-CM | POA: Diagnosis not present

## 2019-04-19 DIAGNOSIS — E0590 Thyrotoxicosis, unspecified without thyrotoxic crisis or storm: Secondary | ICD-10-CM | POA: Diagnosis not present

## 2019-04-19 DIAGNOSIS — Z794 Long term (current) use of insulin: Secondary | ICD-10-CM | POA: Diagnosis not present

## 2019-04-19 DIAGNOSIS — Z992 Dependence on renal dialysis: Secondary | ICD-10-CM | POA: Diagnosis not present

## 2019-04-20 DIAGNOSIS — Z992 Dependence on renal dialysis: Secondary | ICD-10-CM | POA: Diagnosis not present

## 2019-04-20 DIAGNOSIS — N186 End stage renal disease: Secondary | ICD-10-CM | POA: Diagnosis not present

## 2019-04-21 DIAGNOSIS — N186 End stage renal disease: Secondary | ICD-10-CM | POA: Diagnosis not present

## 2019-04-21 DIAGNOSIS — Z992 Dependence on renal dialysis: Secondary | ICD-10-CM | POA: Diagnosis not present

## 2019-04-22 DIAGNOSIS — Z992 Dependence on renal dialysis: Secondary | ICD-10-CM | POA: Diagnosis not present

## 2019-04-22 DIAGNOSIS — N186 End stage renal disease: Secondary | ICD-10-CM | POA: Diagnosis not present

## 2019-04-23 DIAGNOSIS — Z992 Dependence on renal dialysis: Secondary | ICD-10-CM | POA: Diagnosis not present

## 2019-04-23 DIAGNOSIS — N186 End stage renal disease: Secondary | ICD-10-CM | POA: Diagnosis not present

## 2019-04-24 DIAGNOSIS — N186 End stage renal disease: Secondary | ICD-10-CM | POA: Diagnosis not present

## 2019-04-24 DIAGNOSIS — Z992 Dependence on renal dialysis: Secondary | ICD-10-CM | POA: Diagnosis not present

## 2019-04-25 ENCOUNTER — Other Ambulatory Visit: Payer: Self-pay | Admitting: Internal Medicine

## 2019-04-25 DIAGNOSIS — J449 Chronic obstructive pulmonary disease, unspecified: Secondary | ICD-10-CM | POA: Diagnosis not present

## 2019-04-25 DIAGNOSIS — Z992 Dependence on renal dialysis: Secondary | ICD-10-CM | POA: Diagnosis not present

## 2019-04-25 DIAGNOSIS — E785 Hyperlipidemia, unspecified: Secondary | ICD-10-CM | POA: Diagnosis not present

## 2019-04-25 DIAGNOSIS — I803 Phlebitis and thrombophlebitis of lower extremities, unspecified: Secondary | ICD-10-CM

## 2019-04-25 DIAGNOSIS — T148XXA Other injury of unspecified body region, initial encounter: Secondary | ICD-10-CM

## 2019-04-25 DIAGNOSIS — N186 End stage renal disease: Secondary | ICD-10-CM | POA: Diagnosis not present

## 2019-04-25 DIAGNOSIS — R69 Illness, unspecified: Secondary | ICD-10-CM | POA: Diagnosis not present

## 2019-04-25 DIAGNOSIS — I120 Hypertensive chronic kidney disease with stage 5 chronic kidney disease or end stage renal disease: Secondary | ICD-10-CM | POA: Diagnosis not present

## 2019-04-25 DIAGNOSIS — Z794 Long term (current) use of insulin: Secondary | ICD-10-CM | POA: Diagnosis not present

## 2019-04-25 DIAGNOSIS — I69392 Facial weakness following cerebral infarction: Secondary | ICD-10-CM | POA: Diagnosis not present

## 2019-04-25 DIAGNOSIS — I80292 Phlebitis and thrombophlebitis of other deep vessels of left lower extremity: Secondary | ICD-10-CM | POA: Diagnosis not present

## 2019-04-25 DIAGNOSIS — E1122 Type 2 diabetes mellitus with diabetic chronic kidney disease: Secondary | ICD-10-CM | POA: Diagnosis not present

## 2019-04-26 ENCOUNTER — Ambulatory Visit
Admission: RE | Admit: 2019-04-26 | Discharge: 2019-04-26 | Disposition: A | Discharge status: Home / Self Care | Payer: Medicare HMO | Source: Ambulatory Visit | Attending: Internal Medicine | Admitting: Internal Medicine

## 2019-04-26 ENCOUNTER — Other Ambulatory Visit: Payer: Self-pay | Admitting: Internal Medicine

## 2019-04-26 ENCOUNTER — Other Ambulatory Visit: Payer: Self-pay

## 2019-04-26 DIAGNOSIS — M7989 Other specified soft tissue disorders: Secondary | ICD-10-CM | POA: Diagnosis not present

## 2019-04-26 DIAGNOSIS — Z1231 Encounter for screening mammogram for malignant neoplasm of breast: Secondary | ICD-10-CM

## 2019-04-26 DIAGNOSIS — N186 End stage renal disease: Secondary | ICD-10-CM | POA: Diagnosis not present

## 2019-04-26 DIAGNOSIS — M79662 Pain in left lower leg: Secondary | ICD-10-CM | POA: Diagnosis not present

## 2019-04-26 DIAGNOSIS — I803 Phlebitis and thrombophlebitis of lower extremities, unspecified: Secondary | ICD-10-CM | POA: Insufficient documentation

## 2019-04-26 DIAGNOSIS — Z794 Long term (current) use of insulin: Secondary | ICD-10-CM | POA: Diagnosis not present

## 2019-04-26 DIAGNOSIS — T148XXA Other injury of unspecified body region, initial encounter: Secondary | ICD-10-CM | POA: Insufficient documentation

## 2019-04-26 DIAGNOSIS — Z992 Dependence on renal dialysis: Secondary | ICD-10-CM | POA: Diagnosis not present

## 2019-04-26 DIAGNOSIS — D509 Iron deficiency anemia, unspecified: Secondary | ICD-10-CM | POA: Diagnosis not present

## 2019-04-26 DIAGNOSIS — E119 Type 2 diabetes mellitus without complications: Secondary | ICD-10-CM | POA: Diagnosis not present

## 2019-04-27 DIAGNOSIS — N186 End stage renal disease: Secondary | ICD-10-CM | POA: Diagnosis not present

## 2019-04-27 DIAGNOSIS — Z992 Dependence on renal dialysis: Secondary | ICD-10-CM | POA: Diagnosis not present

## 2019-04-28 DIAGNOSIS — Z992 Dependence on renal dialysis: Secondary | ICD-10-CM | POA: Diagnosis not present

## 2019-04-28 DIAGNOSIS — N186 End stage renal disease: Secondary | ICD-10-CM | POA: Diagnosis not present

## 2019-04-29 DIAGNOSIS — Z992 Dependence on renal dialysis: Secondary | ICD-10-CM | POA: Diagnosis not present

## 2019-04-29 DIAGNOSIS — N186 End stage renal disease: Secondary | ICD-10-CM | POA: Diagnosis not present

## 2019-04-30 DIAGNOSIS — N186 End stage renal disease: Secondary | ICD-10-CM | POA: Diagnosis not present

## 2019-04-30 DIAGNOSIS — Z992 Dependence on renal dialysis: Secondary | ICD-10-CM | POA: Diagnosis not present

## 2019-05-01 DIAGNOSIS — Z992 Dependence on renal dialysis: Secondary | ICD-10-CM | POA: Diagnosis not present

## 2019-05-01 DIAGNOSIS — N186 End stage renal disease: Secondary | ICD-10-CM | POA: Diagnosis not present

## 2019-05-02 DIAGNOSIS — Z992 Dependence on renal dialysis: Secondary | ICD-10-CM | POA: Diagnosis not present

## 2019-05-02 DIAGNOSIS — N186 End stage renal disease: Secondary | ICD-10-CM | POA: Diagnosis not present

## 2019-05-03 DIAGNOSIS — N186 End stage renal disease: Secondary | ICD-10-CM | POA: Diagnosis not present

## 2019-05-03 DIAGNOSIS — Z1231 Encounter for screening mammogram for malignant neoplasm of breast: Secondary | ICD-10-CM | POA: Diagnosis not present

## 2019-05-03 DIAGNOSIS — Z992 Dependence on renal dialysis: Secondary | ICD-10-CM | POA: Diagnosis not present

## 2019-05-04 DIAGNOSIS — N186 End stage renal disease: Secondary | ICD-10-CM | POA: Diagnosis not present

## 2019-05-04 DIAGNOSIS — Z992 Dependence on renal dialysis: Secondary | ICD-10-CM | POA: Diagnosis not present

## 2019-05-05 DIAGNOSIS — N186 End stage renal disease: Secondary | ICD-10-CM | POA: Diagnosis not present

## 2019-05-05 DIAGNOSIS — Z992 Dependence on renal dialysis: Secondary | ICD-10-CM | POA: Diagnosis not present

## 2019-05-06 DIAGNOSIS — N186 End stage renal disease: Secondary | ICD-10-CM | POA: Diagnosis not present

## 2019-05-06 DIAGNOSIS — Z992 Dependence on renal dialysis: Secondary | ICD-10-CM | POA: Diagnosis not present

## 2019-05-07 DIAGNOSIS — N186 End stage renal disease: Secondary | ICD-10-CM | POA: Diagnosis not present

## 2019-05-07 DIAGNOSIS — E785 Hyperlipidemia, unspecified: Secondary | ICD-10-CM | POA: Diagnosis not present

## 2019-05-07 DIAGNOSIS — I700 Atherosclerosis of aorta: Secondary | ICD-10-CM | POA: Diagnosis not present

## 2019-05-07 DIAGNOSIS — I2510 Atherosclerotic heart disease of native coronary artery without angina pectoris: Secondary | ICD-10-CM | POA: Diagnosis not present

## 2019-05-07 DIAGNOSIS — Z992 Dependence on renal dialysis: Secondary | ICD-10-CM | POA: Diagnosis not present

## 2019-05-07 DIAGNOSIS — N184 Chronic kidney disease, stage 4 (severe): Secondary | ICD-10-CM | POA: Diagnosis not present

## 2019-05-07 DIAGNOSIS — E1122 Type 2 diabetes mellitus with diabetic chronic kidney disease: Secondary | ICD-10-CM | POA: Diagnosis not present

## 2019-05-07 DIAGNOSIS — R0602 Shortness of breath: Secondary | ICD-10-CM | POA: Diagnosis not present

## 2019-05-07 DIAGNOSIS — Z794 Long term (current) use of insulin: Secondary | ICD-10-CM | POA: Diagnosis not present

## 2019-05-07 DIAGNOSIS — R609 Edema, unspecified: Secondary | ICD-10-CM | POA: Diagnosis not present

## 2019-05-07 DIAGNOSIS — E1169 Type 2 diabetes mellitus with other specified complication: Secondary | ICD-10-CM | POA: Diagnosis not present

## 2019-05-08 DIAGNOSIS — N186 End stage renal disease: Secondary | ICD-10-CM | POA: Diagnosis not present

## 2019-05-08 DIAGNOSIS — Z992 Dependence on renal dialysis: Secondary | ICD-10-CM | POA: Diagnosis not present

## 2019-05-09 DIAGNOSIS — I120 Hypertensive chronic kidney disease with stage 5 chronic kidney disease or end stage renal disease: Secondary | ICD-10-CM | POA: Diagnosis not present

## 2019-05-09 DIAGNOSIS — R69 Illness, unspecified: Secondary | ICD-10-CM | POA: Diagnosis not present

## 2019-05-09 DIAGNOSIS — E1122 Type 2 diabetes mellitus with diabetic chronic kidney disease: Secondary | ICD-10-CM | POA: Diagnosis not present

## 2019-05-09 DIAGNOSIS — I5189 Other ill-defined heart diseases: Secondary | ICD-10-CM | POA: Diagnosis not present

## 2019-05-09 DIAGNOSIS — R58 Hemorrhage, not elsewhere classified: Secondary | ICD-10-CM | POA: Diagnosis not present

## 2019-05-09 DIAGNOSIS — Z992 Dependence on renal dialysis: Secondary | ICD-10-CM | POA: Diagnosis not present

## 2019-05-09 DIAGNOSIS — M79605 Pain in left leg: Secondary | ICD-10-CM | POA: Diagnosis not present

## 2019-05-09 DIAGNOSIS — N186 End stage renal disease: Secondary | ICD-10-CM | POA: Diagnosis not present

## 2019-05-09 DIAGNOSIS — I69392 Facial weakness following cerebral infarction: Secondary | ICD-10-CM | POA: Diagnosis not present

## 2019-05-09 DIAGNOSIS — Z794 Long term (current) use of insulin: Secondary | ICD-10-CM | POA: Diagnosis not present

## 2019-05-10 DIAGNOSIS — Z992 Dependence on renal dialysis: Secondary | ICD-10-CM | POA: Diagnosis not present

## 2019-05-10 DIAGNOSIS — N186 End stage renal disease: Secondary | ICD-10-CM | POA: Diagnosis not present

## 2019-05-11 DIAGNOSIS — Z992 Dependence on renal dialysis: Secondary | ICD-10-CM | POA: Diagnosis not present

## 2019-05-11 DIAGNOSIS — N186 End stage renal disease: Secondary | ICD-10-CM | POA: Diagnosis not present

## 2019-05-12 DIAGNOSIS — N186 End stage renal disease: Secondary | ICD-10-CM | POA: Diagnosis not present

## 2019-05-12 DIAGNOSIS — Z992 Dependence on renal dialysis: Secondary | ICD-10-CM | POA: Diagnosis not present

## 2019-05-13 DIAGNOSIS — R69 Illness, unspecified: Secondary | ICD-10-CM | POA: Diagnosis not present

## 2019-05-13 DIAGNOSIS — N186 End stage renal disease: Secondary | ICD-10-CM | POA: Diagnosis not present

## 2019-05-13 DIAGNOSIS — Z992 Dependence on renal dialysis: Secondary | ICD-10-CM | POA: Diagnosis not present

## 2019-05-14 DIAGNOSIS — N186 End stage renal disease: Secondary | ICD-10-CM | POA: Diagnosis not present

## 2019-05-14 DIAGNOSIS — Z992 Dependence on renal dialysis: Secondary | ICD-10-CM | POA: Diagnosis not present

## 2019-05-14 DIAGNOSIS — E1122 Type 2 diabetes mellitus with diabetic chronic kidney disease: Secondary | ICD-10-CM | POA: Diagnosis not present

## 2019-05-14 DIAGNOSIS — Z794 Long term (current) use of insulin: Secondary | ICD-10-CM | POA: Diagnosis not present

## 2019-05-14 DIAGNOSIS — M79605 Pain in left leg: Secondary | ICD-10-CM | POA: Diagnosis not present

## 2019-05-15 DIAGNOSIS — Z992 Dependence on renal dialysis: Secondary | ICD-10-CM | POA: Diagnosis not present

## 2019-05-15 DIAGNOSIS — N186 End stage renal disease: Secondary | ICD-10-CM | POA: Diagnosis not present

## 2019-05-16 DIAGNOSIS — Z992 Dependence on renal dialysis: Secondary | ICD-10-CM | POA: Diagnosis not present

## 2019-05-16 DIAGNOSIS — N186 End stage renal disease: Secondary | ICD-10-CM | POA: Diagnosis not present

## 2019-05-17 DIAGNOSIS — N186 End stage renal disease: Secondary | ICD-10-CM | POA: Diagnosis not present

## 2019-05-17 DIAGNOSIS — Z992 Dependence on renal dialysis: Secondary | ICD-10-CM | POA: Diagnosis not present

## 2019-05-17 DIAGNOSIS — Z298 Encounter for other specified prophylactic measures: Secondary | ICD-10-CM | POA: Diagnosis not present

## 2019-05-18 DIAGNOSIS — N186 End stage renal disease: Secondary | ICD-10-CM | POA: Diagnosis not present

## 2019-05-18 DIAGNOSIS — Z992 Dependence on renal dialysis: Secondary | ICD-10-CM | POA: Diagnosis not present

## 2019-05-19 DIAGNOSIS — N186 End stage renal disease: Secondary | ICD-10-CM | POA: Diagnosis not present

## 2019-05-19 DIAGNOSIS — Z992 Dependence on renal dialysis: Secondary | ICD-10-CM | POA: Diagnosis not present

## 2019-05-20 DIAGNOSIS — Z992 Dependence on renal dialysis: Secondary | ICD-10-CM | POA: Diagnosis not present

## 2019-05-20 DIAGNOSIS — N186 End stage renal disease: Secondary | ICD-10-CM | POA: Diagnosis not present

## 2019-05-21 DIAGNOSIS — Z992 Dependence on renal dialysis: Secondary | ICD-10-CM | POA: Diagnosis not present

## 2019-05-21 DIAGNOSIS — N186 End stage renal disease: Secondary | ICD-10-CM | POA: Diagnosis not present

## 2019-05-22 DIAGNOSIS — Z992 Dependence on renal dialysis: Secondary | ICD-10-CM | POA: Diagnosis not present

## 2019-05-22 DIAGNOSIS — N186 End stage renal disease: Secondary | ICD-10-CM | POA: Diagnosis not present

## 2019-05-23 DIAGNOSIS — Z992 Dependence on renal dialysis: Secondary | ICD-10-CM | POA: Diagnosis not present

## 2019-05-23 DIAGNOSIS — N186 End stage renal disease: Secondary | ICD-10-CM | POA: Diagnosis not present

## 2019-05-24 DIAGNOSIS — Z992 Dependence on renal dialysis: Secondary | ICD-10-CM | POA: Diagnosis not present

## 2019-05-24 DIAGNOSIS — N186 End stage renal disease: Secondary | ICD-10-CM | POA: Diagnosis not present

## 2019-05-25 DIAGNOSIS — N186 End stage renal disease: Secondary | ICD-10-CM | POA: Diagnosis not present

## 2019-05-25 DIAGNOSIS — D509 Iron deficiency anemia, unspecified: Secondary | ICD-10-CM | POA: Diagnosis not present

## 2019-05-25 DIAGNOSIS — Z992 Dependence on renal dialysis: Secondary | ICD-10-CM | POA: Diagnosis not present

## 2019-05-26 DIAGNOSIS — Z992 Dependence on renal dialysis: Secondary | ICD-10-CM | POA: Diagnosis not present

## 2019-05-26 DIAGNOSIS — N186 End stage renal disease: Secondary | ICD-10-CM | POA: Diagnosis not present

## 2019-05-27 DIAGNOSIS — Z992 Dependence on renal dialysis: Secondary | ICD-10-CM | POA: Diagnosis not present

## 2019-05-27 DIAGNOSIS — N186 End stage renal disease: Secondary | ICD-10-CM | POA: Diagnosis not present

## 2019-05-28 DIAGNOSIS — Z992 Dependence on renal dialysis: Secondary | ICD-10-CM | POA: Diagnosis not present

## 2019-05-28 DIAGNOSIS — N186 End stage renal disease: Secondary | ICD-10-CM | POA: Diagnosis not present

## 2019-05-29 DIAGNOSIS — Z992 Dependence on renal dialysis: Secondary | ICD-10-CM | POA: Diagnosis not present

## 2019-05-29 DIAGNOSIS — N186 End stage renal disease: Secondary | ICD-10-CM | POA: Diagnosis not present

## 2019-05-30 DIAGNOSIS — Z992 Dependence on renal dialysis: Secondary | ICD-10-CM | POA: Diagnosis not present

## 2019-05-30 DIAGNOSIS — N186 End stage renal disease: Secondary | ICD-10-CM | POA: Diagnosis not present

## 2019-05-31 DIAGNOSIS — Z992 Dependence on renal dialysis: Secondary | ICD-10-CM | POA: Diagnosis not present

## 2019-05-31 DIAGNOSIS — N186 End stage renal disease: Secondary | ICD-10-CM | POA: Diagnosis not present

## 2019-06-01 DIAGNOSIS — N186 End stage renal disease: Secondary | ICD-10-CM | POA: Diagnosis not present

## 2019-06-01 DIAGNOSIS — Z992 Dependence on renal dialysis: Secondary | ICD-10-CM | POA: Diagnosis not present

## 2019-06-02 DIAGNOSIS — N186 End stage renal disease: Secondary | ICD-10-CM | POA: Diagnosis not present

## 2019-06-02 DIAGNOSIS — Z992 Dependence on renal dialysis: Secondary | ICD-10-CM | POA: Diagnosis not present

## 2019-06-03 DIAGNOSIS — Z992 Dependence on renal dialysis: Secondary | ICD-10-CM | POA: Diagnosis not present

## 2019-06-03 DIAGNOSIS — N186 End stage renal disease: Secondary | ICD-10-CM | POA: Diagnosis not present

## 2019-06-04 DIAGNOSIS — Z298 Encounter for other specified prophylactic measures: Secondary | ICD-10-CM | POA: Diagnosis not present

## 2019-06-04 DIAGNOSIS — Z992 Dependence on renal dialysis: Secondary | ICD-10-CM | POA: Diagnosis not present

## 2019-06-04 DIAGNOSIS — N186 End stage renal disease: Secondary | ICD-10-CM | POA: Diagnosis not present

## 2019-06-05 DIAGNOSIS — Z992 Dependence on renal dialysis: Secondary | ICD-10-CM | POA: Diagnosis not present

## 2019-06-05 DIAGNOSIS — N186 End stage renal disease: Secondary | ICD-10-CM | POA: Diagnosis not present

## 2019-06-06 DIAGNOSIS — M5442 Lumbago with sciatica, left side: Secondary | ICD-10-CM | POA: Diagnosis not present

## 2019-06-06 DIAGNOSIS — N186 End stage renal disease: Secondary | ICD-10-CM | POA: Diagnosis not present

## 2019-06-06 DIAGNOSIS — M545 Low back pain: Secondary | ICD-10-CM | POA: Diagnosis not present

## 2019-06-06 DIAGNOSIS — M79605 Pain in left leg: Secondary | ICD-10-CM | POA: Diagnosis not present

## 2019-06-06 DIAGNOSIS — Z992 Dependence on renal dialysis: Secondary | ICD-10-CM | POA: Diagnosis not present

## 2019-06-07 DIAGNOSIS — Z992 Dependence on renal dialysis: Secondary | ICD-10-CM | POA: Diagnosis not present

## 2019-06-07 DIAGNOSIS — N186 End stage renal disease: Secondary | ICD-10-CM | POA: Diagnosis not present

## 2019-06-08 DIAGNOSIS — Z992 Dependence on renal dialysis: Secondary | ICD-10-CM | POA: Diagnosis not present

## 2019-06-08 DIAGNOSIS — N186 End stage renal disease: Secondary | ICD-10-CM | POA: Diagnosis not present

## 2019-06-09 DIAGNOSIS — R69 Illness, unspecified: Secondary | ICD-10-CM | POA: Diagnosis not present

## 2019-06-09 DIAGNOSIS — Z992 Dependence on renal dialysis: Secondary | ICD-10-CM | POA: Diagnosis not present

## 2019-06-09 DIAGNOSIS — N186 End stage renal disease: Secondary | ICD-10-CM | POA: Diagnosis not present

## 2019-06-10 DIAGNOSIS — Z992 Dependence on renal dialysis: Secondary | ICD-10-CM | POA: Diagnosis not present

## 2019-06-10 DIAGNOSIS — N186 End stage renal disease: Secondary | ICD-10-CM | POA: Diagnosis not present

## 2019-06-11 DIAGNOSIS — Z992 Dependence on renal dialysis: Secondary | ICD-10-CM | POA: Diagnosis not present

## 2019-06-11 DIAGNOSIS — N186 End stage renal disease: Secondary | ICD-10-CM | POA: Diagnosis not present

## 2019-06-12 DIAGNOSIS — Z992 Dependence on renal dialysis: Secondary | ICD-10-CM | POA: Diagnosis not present

## 2019-06-12 DIAGNOSIS — N186 End stage renal disease: Secondary | ICD-10-CM | POA: Diagnosis not present

## 2019-06-13 DIAGNOSIS — Z992 Dependence on renal dialysis: Secondary | ICD-10-CM | POA: Diagnosis not present

## 2019-06-13 DIAGNOSIS — N186 End stage renal disease: Secondary | ICD-10-CM | POA: Diagnosis not present

## 2019-06-14 DIAGNOSIS — Z992 Dependence on renal dialysis: Secondary | ICD-10-CM | POA: Diagnosis not present

## 2019-06-14 DIAGNOSIS — R8290 Unspecified abnormal findings in urine: Secondary | ICD-10-CM | POA: Diagnosis not present

## 2019-06-14 DIAGNOSIS — R399 Unspecified symptoms and signs involving the genitourinary system: Secondary | ICD-10-CM | POA: Diagnosis not present

## 2019-06-14 DIAGNOSIS — N186 End stage renal disease: Secondary | ICD-10-CM | POA: Diagnosis not present

## 2019-06-15 DIAGNOSIS — N186 End stage renal disease: Secondary | ICD-10-CM | POA: Diagnosis not present

## 2019-06-15 DIAGNOSIS — Z992 Dependence on renal dialysis: Secondary | ICD-10-CM | POA: Diagnosis not present

## 2019-06-16 DIAGNOSIS — Z992 Dependence on renal dialysis: Secondary | ICD-10-CM | POA: Diagnosis not present

## 2019-06-16 DIAGNOSIS — N186 End stage renal disease: Secondary | ICD-10-CM | POA: Diagnosis not present

## 2019-06-17 DIAGNOSIS — Z992 Dependence on renal dialysis: Secondary | ICD-10-CM | POA: Diagnosis not present

## 2019-06-17 DIAGNOSIS — N186 End stage renal disease: Secondary | ICD-10-CM | POA: Diagnosis not present

## 2019-06-18 DIAGNOSIS — Z992 Dependence on renal dialysis: Secondary | ICD-10-CM | POA: Diagnosis not present

## 2019-06-18 DIAGNOSIS — N186 End stage renal disease: Secondary | ICD-10-CM | POA: Diagnosis not present

## 2019-06-19 DIAGNOSIS — Z992 Dependence on renal dialysis: Secondary | ICD-10-CM | POA: Diagnosis not present

## 2019-06-19 DIAGNOSIS — N186 End stage renal disease: Secondary | ICD-10-CM | POA: Diagnosis not present

## 2019-06-20 DIAGNOSIS — N186 End stage renal disease: Secondary | ICD-10-CM | POA: Diagnosis not present

## 2019-06-20 DIAGNOSIS — Z992 Dependence on renal dialysis: Secondary | ICD-10-CM | POA: Diagnosis not present

## 2019-06-21 DIAGNOSIS — N186 End stage renal disease: Secondary | ICD-10-CM | POA: Diagnosis not present

## 2019-06-21 DIAGNOSIS — M545 Low back pain: Secondary | ICD-10-CM | POA: Diagnosis not present

## 2019-06-21 DIAGNOSIS — Z992 Dependence on renal dialysis: Secondary | ICD-10-CM | POA: Diagnosis not present

## 2019-06-21 DIAGNOSIS — S76312A Strain of muscle, fascia and tendon of the posterior muscle group at thigh level, left thigh, initial encounter: Secondary | ICD-10-CM | POA: Diagnosis not present

## 2019-06-21 DIAGNOSIS — M4310 Spondylolisthesis, site unspecified: Secondary | ICD-10-CM | POA: Diagnosis not present

## 2019-06-22 DIAGNOSIS — N186 End stage renal disease: Secondary | ICD-10-CM | POA: Diagnosis not present

## 2019-06-22 DIAGNOSIS — Z992 Dependence on renal dialysis: Secondary | ICD-10-CM | POA: Diagnosis not present

## 2019-06-23 DIAGNOSIS — Z992 Dependence on renal dialysis: Secondary | ICD-10-CM | POA: Diagnosis not present

## 2019-06-23 DIAGNOSIS — N186 End stage renal disease: Secondary | ICD-10-CM | POA: Diagnosis not present

## 2019-06-24 DIAGNOSIS — N186 End stage renal disease: Secondary | ICD-10-CM | POA: Diagnosis not present

## 2019-06-24 DIAGNOSIS — Z992 Dependence on renal dialysis: Secondary | ICD-10-CM | POA: Diagnosis not present

## 2019-06-25 DIAGNOSIS — D509 Iron deficiency anemia, unspecified: Secondary | ICD-10-CM | POA: Diagnosis not present

## 2019-06-25 DIAGNOSIS — Z992 Dependence on renal dialysis: Secondary | ICD-10-CM | POA: Diagnosis not present

## 2019-06-25 DIAGNOSIS — N186 End stage renal disease: Secondary | ICD-10-CM | POA: Diagnosis not present

## 2019-06-26 DIAGNOSIS — Z992 Dependence on renal dialysis: Secondary | ICD-10-CM | POA: Diagnosis not present

## 2019-06-26 DIAGNOSIS — N186 End stage renal disease: Secondary | ICD-10-CM | POA: Diagnosis not present

## 2019-06-27 DIAGNOSIS — N186 End stage renal disease: Secondary | ICD-10-CM | POA: Diagnosis not present

## 2019-06-27 DIAGNOSIS — Z992 Dependence on renal dialysis: Secondary | ICD-10-CM | POA: Diagnosis not present

## 2019-06-28 DIAGNOSIS — N186 End stage renal disease: Secondary | ICD-10-CM | POA: Diagnosis not present

## 2019-06-28 DIAGNOSIS — Z992 Dependence on renal dialysis: Secondary | ICD-10-CM | POA: Diagnosis not present

## 2019-06-29 DIAGNOSIS — Z992 Dependence on renal dialysis: Secondary | ICD-10-CM | POA: Diagnosis not present

## 2019-06-29 DIAGNOSIS — N186 End stage renal disease: Secondary | ICD-10-CM | POA: Diagnosis not present

## 2019-06-30 DIAGNOSIS — Z992 Dependence on renal dialysis: Secondary | ICD-10-CM | POA: Diagnosis not present

## 2019-06-30 DIAGNOSIS — N186 End stage renal disease: Secondary | ICD-10-CM | POA: Diagnosis not present

## 2019-07-01 DIAGNOSIS — N186 End stage renal disease: Secondary | ICD-10-CM | POA: Diagnosis not present

## 2019-07-01 DIAGNOSIS — Z992 Dependence on renal dialysis: Secondary | ICD-10-CM | POA: Diagnosis not present

## 2019-07-02 DIAGNOSIS — Z992 Dependence on renal dialysis: Secondary | ICD-10-CM | POA: Diagnosis not present

## 2019-07-02 DIAGNOSIS — N186 End stage renal disease: Secondary | ICD-10-CM | POA: Diagnosis not present

## 2019-07-03 DIAGNOSIS — Z992 Dependence on renal dialysis: Secondary | ICD-10-CM | POA: Diagnosis not present

## 2019-07-03 DIAGNOSIS — N186 End stage renal disease: Secondary | ICD-10-CM | POA: Diagnosis not present

## 2019-07-04 DIAGNOSIS — Z992 Dependence on renal dialysis: Secondary | ICD-10-CM | POA: Diagnosis not present

## 2019-07-04 DIAGNOSIS — Z6837 Body mass index (BMI) 37.0-37.9, adult: Secondary | ICD-10-CM | POA: Diagnosis not present

## 2019-07-04 DIAGNOSIS — K420 Umbilical hernia with obstruction, without gangrene: Secondary | ICD-10-CM | POA: Diagnosis not present

## 2019-07-04 DIAGNOSIS — Z7901 Long term (current) use of anticoagulants: Secondary | ICD-10-CM | POA: Diagnosis not present

## 2019-07-04 DIAGNOSIS — N186 End stage renal disease: Secondary | ICD-10-CM | POA: Diagnosis not present

## 2019-07-05 DIAGNOSIS — Z992 Dependence on renal dialysis: Secondary | ICD-10-CM | POA: Diagnosis not present

## 2019-07-05 DIAGNOSIS — N186 End stage renal disease: Secondary | ICD-10-CM | POA: Diagnosis not present

## 2019-07-05 DIAGNOSIS — M545 Low back pain: Secondary | ICD-10-CM | POA: Diagnosis not present

## 2019-07-06 DIAGNOSIS — N186 End stage renal disease: Secondary | ICD-10-CM | POA: Diagnosis not present

## 2019-07-06 DIAGNOSIS — Z992 Dependence on renal dialysis: Secondary | ICD-10-CM | POA: Diagnosis not present

## 2019-07-07 DIAGNOSIS — Z992 Dependence on renal dialysis: Secondary | ICD-10-CM | POA: Diagnosis not present

## 2019-07-07 DIAGNOSIS — N186 End stage renal disease: Secondary | ICD-10-CM | POA: Diagnosis not present

## 2019-07-08 DIAGNOSIS — Z992 Dependence on renal dialysis: Secondary | ICD-10-CM | POA: Diagnosis not present

## 2019-07-08 DIAGNOSIS — N186 End stage renal disease: Secondary | ICD-10-CM | POA: Diagnosis not present

## 2019-07-09 DIAGNOSIS — Z992 Dependence on renal dialysis: Secondary | ICD-10-CM | POA: Diagnosis not present

## 2019-07-09 DIAGNOSIS — N186 End stage renal disease: Secondary | ICD-10-CM | POA: Diagnosis not present

## 2019-07-10 DIAGNOSIS — Z992 Dependence on renal dialysis: Secondary | ICD-10-CM | POA: Diagnosis not present

## 2019-07-10 DIAGNOSIS — N186 End stage renal disease: Secondary | ICD-10-CM | POA: Diagnosis not present

## 2019-07-10 DIAGNOSIS — M545 Low back pain: Secondary | ICD-10-CM | POA: Diagnosis not present

## 2019-07-11 DIAGNOSIS — N186 End stage renal disease: Secondary | ICD-10-CM | POA: Diagnosis not present

## 2019-07-11 DIAGNOSIS — Z992 Dependence on renal dialysis: Secondary | ICD-10-CM | POA: Diagnosis not present

## 2019-07-12 DIAGNOSIS — M545 Low back pain: Secondary | ICD-10-CM | POA: Diagnosis not present

## 2019-07-12 DIAGNOSIS — N186 End stage renal disease: Secondary | ICD-10-CM | POA: Diagnosis not present

## 2019-07-12 DIAGNOSIS — Z992 Dependence on renal dialysis: Secondary | ICD-10-CM | POA: Diagnosis not present

## 2019-07-13 DIAGNOSIS — N186 End stage renal disease: Secondary | ICD-10-CM | POA: Diagnosis not present

## 2019-07-13 DIAGNOSIS — Z992 Dependence on renal dialysis: Secondary | ICD-10-CM | POA: Diagnosis not present

## 2019-07-14 DIAGNOSIS — Z992 Dependence on renal dialysis: Secondary | ICD-10-CM | POA: Diagnosis not present

## 2019-07-14 DIAGNOSIS — N186 End stage renal disease: Secondary | ICD-10-CM | POA: Diagnosis not present

## 2019-07-15 DIAGNOSIS — Z992 Dependence on renal dialysis: Secondary | ICD-10-CM | POA: Diagnosis not present

## 2019-07-15 DIAGNOSIS — N186 End stage renal disease: Secondary | ICD-10-CM | POA: Diagnosis not present

## 2019-07-16 DIAGNOSIS — Z992 Dependence on renal dialysis: Secondary | ICD-10-CM | POA: Diagnosis not present

## 2019-07-16 DIAGNOSIS — N186 End stage renal disease: Secondary | ICD-10-CM | POA: Diagnosis not present

## 2019-07-16 DIAGNOSIS — R69 Illness, unspecified: Secondary | ICD-10-CM | POA: Diagnosis not present

## 2019-07-17 DIAGNOSIS — Z992 Dependence on renal dialysis: Secondary | ICD-10-CM | POA: Diagnosis not present

## 2019-07-17 DIAGNOSIS — N186 End stage renal disease: Secondary | ICD-10-CM | POA: Diagnosis not present

## 2019-07-18 DIAGNOSIS — Z992 Dependence on renal dialysis: Secondary | ICD-10-CM | POA: Diagnosis not present

## 2019-07-18 DIAGNOSIS — N186 End stage renal disease: Secondary | ICD-10-CM | POA: Diagnosis not present

## 2019-07-19 DIAGNOSIS — Z992 Dependence on renal dialysis: Secondary | ICD-10-CM | POA: Diagnosis not present

## 2019-07-19 DIAGNOSIS — N186 End stage renal disease: Secondary | ICD-10-CM | POA: Diagnosis not present

## 2019-07-20 DIAGNOSIS — Z992 Dependence on renal dialysis: Secondary | ICD-10-CM | POA: Diagnosis not present

## 2019-07-20 DIAGNOSIS — N186 End stage renal disease: Secondary | ICD-10-CM | POA: Diagnosis not present

## 2019-07-21 DIAGNOSIS — N186 End stage renal disease: Secondary | ICD-10-CM | POA: Diagnosis not present

## 2019-07-21 DIAGNOSIS — Z992 Dependence on renal dialysis: Secondary | ICD-10-CM | POA: Diagnosis not present

## 2019-07-22 DIAGNOSIS — Z992 Dependence on renal dialysis: Secondary | ICD-10-CM | POA: Diagnosis not present

## 2019-07-22 DIAGNOSIS — N186 End stage renal disease: Secondary | ICD-10-CM | POA: Diagnosis not present

## 2019-07-23 DIAGNOSIS — Z992 Dependence on renal dialysis: Secondary | ICD-10-CM | POA: Diagnosis not present

## 2019-07-23 DIAGNOSIS — N186 End stage renal disease: Secondary | ICD-10-CM | POA: Diagnosis not present

## 2019-07-24 DIAGNOSIS — I132 Hypertensive heart and chronic kidney disease with heart failure and with stage 5 chronic kidney disease, or end stage renal disease: Secondary | ICD-10-CM | POA: Diagnosis not present

## 2019-07-24 DIAGNOSIS — I5032 Chronic diastolic (congestive) heart failure: Secondary | ICD-10-CM | POA: Diagnosis not present

## 2019-07-24 DIAGNOSIS — R9431 Abnormal electrocardiogram [ECG] [EKG]: Secondary | ICD-10-CM | POA: Diagnosis not present

## 2019-07-24 DIAGNOSIS — K429 Umbilical hernia without obstruction or gangrene: Secondary | ICD-10-CM | POA: Diagnosis not present

## 2019-07-24 DIAGNOSIS — E876 Hypokalemia: Secondary | ICD-10-CM | POA: Diagnosis not present

## 2019-07-24 DIAGNOSIS — N186 End stage renal disease: Secondary | ICD-10-CM | POA: Diagnosis not present

## 2019-07-24 DIAGNOSIS — D122 Benign neoplasm of ascending colon: Secondary | ICD-10-CM | POA: Diagnosis not present

## 2019-07-24 DIAGNOSIS — D649 Anemia, unspecified: Secondary | ICD-10-CM | POA: Diagnosis not present

## 2019-07-24 DIAGNOSIS — K5730 Diverticulosis of large intestine without perforation or abscess without bleeding: Secondary | ICD-10-CM | POA: Diagnosis not present

## 2019-07-24 DIAGNOSIS — K635 Polyp of colon: Secondary | ICD-10-CM | POA: Diagnosis not present

## 2019-07-24 DIAGNOSIS — E1122 Type 2 diabetes mellitus with diabetic chronic kidney disease: Secondary | ICD-10-CM | POA: Diagnosis not present

## 2019-07-24 DIAGNOSIS — E785 Hyperlipidemia, unspecified: Secondary | ICD-10-CM | POA: Diagnosis not present

## 2019-07-24 DIAGNOSIS — J449 Chronic obstructive pulmonary disease, unspecified: Secondary | ICD-10-CM | POA: Diagnosis not present

## 2019-07-24 DIAGNOSIS — I120 Hypertensive chronic kidney disease with stage 5 chronic kidney disease or end stage renal disease: Secondary | ICD-10-CM | POA: Diagnosis not present

## 2019-07-24 DIAGNOSIS — K219 Gastro-esophageal reflux disease without esophagitis: Secondary | ICD-10-CM | POA: Diagnosis not present

## 2019-07-24 DIAGNOSIS — Z992 Dependence on renal dialysis: Secondary | ICD-10-CM | POA: Diagnosis not present

## 2019-07-24 DIAGNOSIS — K625 Hemorrhage of anus and rectum: Secondary | ICD-10-CM | POA: Diagnosis not present

## 2019-07-24 DIAGNOSIS — Z79899 Other long term (current) drug therapy: Secondary | ICD-10-CM | POA: Diagnosis not present

## 2019-07-24 DIAGNOSIS — K5909 Other constipation: Secondary | ICD-10-CM | POA: Diagnosis not present

## 2019-07-24 DIAGNOSIS — Z20822 Contact with and (suspected) exposure to covid-19: Secondary | ICD-10-CM | POA: Diagnosis not present

## 2019-07-25 DIAGNOSIS — N186 End stage renal disease: Secondary | ICD-10-CM | POA: Diagnosis not present

## 2019-07-25 DIAGNOSIS — E1122 Type 2 diabetes mellitus with diabetic chronic kidney disease: Secondary | ICD-10-CM | POA: Diagnosis not present

## 2019-07-25 DIAGNOSIS — K5900 Constipation, unspecified: Secondary | ICD-10-CM | POA: Diagnosis not present

## 2019-07-25 DIAGNOSIS — Z79899 Other long term (current) drug therapy: Secondary | ICD-10-CM | POA: Diagnosis not present

## 2019-07-25 DIAGNOSIS — E876 Hypokalemia: Secondary | ICD-10-CM | POA: Diagnosis not present

## 2019-07-25 DIAGNOSIS — R0989 Other specified symptoms and signs involving the circulatory and respiratory systems: Secondary | ICD-10-CM | POA: Diagnosis not present

## 2019-07-25 DIAGNOSIS — K219 Gastro-esophageal reflux disease without esophagitis: Secondary | ICD-10-CM | POA: Diagnosis not present

## 2019-07-25 DIAGNOSIS — K5909 Other constipation: Secondary | ICD-10-CM | POA: Diagnosis not present

## 2019-07-25 DIAGNOSIS — K429 Umbilical hernia without obstruction or gangrene: Secondary | ICD-10-CM | POA: Diagnosis not present

## 2019-07-25 DIAGNOSIS — R011 Cardiac murmur, unspecified: Secondary | ICD-10-CM | POA: Diagnosis not present

## 2019-07-25 DIAGNOSIS — K635 Polyp of colon: Secondary | ICD-10-CM | POA: Diagnosis not present

## 2019-07-25 DIAGNOSIS — I517 Cardiomegaly: Secondary | ICD-10-CM | POA: Diagnosis not present

## 2019-07-25 DIAGNOSIS — Z992 Dependence on renal dialysis: Secondary | ICD-10-CM | POA: Diagnosis not present

## 2019-07-25 DIAGNOSIS — I2510 Atherosclerotic heart disease of native coronary artery without angina pectoris: Secondary | ICD-10-CM | POA: Diagnosis not present

## 2019-07-25 DIAGNOSIS — I120 Hypertensive chronic kidney disease with stage 5 chronic kidney disease or end stage renal disease: Secondary | ICD-10-CM | POA: Diagnosis not present

## 2019-07-25 DIAGNOSIS — K625 Hemorrhage of anus and rectum: Secondary | ICD-10-CM | POA: Diagnosis not present

## 2019-07-25 DIAGNOSIS — I10 Essential (primary) hypertension: Secondary | ICD-10-CM | POA: Diagnosis not present

## 2019-07-25 DIAGNOSIS — E785 Hyperlipidemia, unspecified: Secondary | ICD-10-CM | POA: Diagnosis not present

## 2019-07-25 DIAGNOSIS — D649 Anemia, unspecified: Secondary | ICD-10-CM | POA: Diagnosis not present

## 2019-07-26 DIAGNOSIS — I2510 Atherosclerotic heart disease of native coronary artery without angina pectoris: Secondary | ICD-10-CM | POA: Diagnosis not present

## 2019-07-26 DIAGNOSIS — D649 Anemia, unspecified: Secondary | ICD-10-CM | POA: Diagnosis not present

## 2019-07-26 DIAGNOSIS — D631 Anemia in chronic kidney disease: Secondary | ICD-10-CM | POA: Diagnosis not present

## 2019-07-26 DIAGNOSIS — Z79899 Other long term (current) drug therapy: Secondary | ICD-10-CM | POA: Diagnosis not present

## 2019-07-26 DIAGNOSIS — K625 Hemorrhage of anus and rectum: Secondary | ICD-10-CM | POA: Diagnosis not present

## 2019-07-26 DIAGNOSIS — E1122 Type 2 diabetes mellitus with diabetic chronic kidney disease: Secondary | ICD-10-CM | POA: Diagnosis not present

## 2019-07-26 DIAGNOSIS — N186 End stage renal disease: Secondary | ICD-10-CM | POA: Diagnosis not present

## 2019-07-26 DIAGNOSIS — I5032 Chronic diastolic (congestive) heart failure: Secondary | ICD-10-CM | POA: Diagnosis not present

## 2019-07-26 DIAGNOSIS — E876 Hypokalemia: Secondary | ICD-10-CM | POA: Diagnosis not present

## 2019-07-26 DIAGNOSIS — K635 Polyp of colon: Secondary | ICD-10-CM | POA: Diagnosis not present

## 2019-07-26 DIAGNOSIS — K5909 Other constipation: Secondary | ICD-10-CM | POA: Diagnosis not present

## 2019-07-26 DIAGNOSIS — I132 Hypertensive heart and chronic kidney disease with heart failure and with stage 5 chronic kidney disease, or end stage renal disease: Secondary | ICD-10-CM | POA: Diagnosis not present

## 2019-07-26 DIAGNOSIS — E785 Hyperlipidemia, unspecified: Secondary | ICD-10-CM | POA: Diagnosis not present

## 2019-07-26 DIAGNOSIS — D122 Benign neoplasm of ascending colon: Secondary | ICD-10-CM | POA: Diagnosis not present

## 2019-07-26 DIAGNOSIS — Z992 Dependence on renal dialysis: Secondary | ICD-10-CM | POA: Diagnosis not present

## 2019-07-26 DIAGNOSIS — I120 Hypertensive chronic kidney disease with stage 5 chronic kidney disease or end stage renal disease: Secondary | ICD-10-CM | POA: Diagnosis not present

## 2019-07-26 DIAGNOSIS — Z01811 Encounter for preprocedural respiratory examination: Secondary | ICD-10-CM | POA: Diagnosis not present

## 2019-07-26 DIAGNOSIS — K5900 Constipation, unspecified: Secondary | ICD-10-CM | POA: Diagnosis not present

## 2019-07-27 DIAGNOSIS — K635 Polyp of colon: Secondary | ICD-10-CM | POA: Diagnosis not present

## 2019-07-27 DIAGNOSIS — Z794 Long term (current) use of insulin: Secondary | ICD-10-CM | POA: Diagnosis not present

## 2019-07-27 DIAGNOSIS — E785 Hyperlipidemia, unspecified: Secondary | ICD-10-CM | POA: Diagnosis not present

## 2019-07-27 DIAGNOSIS — Z992 Dependence on renal dialysis: Secondary | ICD-10-CM | POA: Diagnosis not present

## 2019-07-27 DIAGNOSIS — K625 Hemorrhage of anus and rectum: Secondary | ICD-10-CM | POA: Diagnosis not present

## 2019-07-27 DIAGNOSIS — K5909 Other constipation: Secondary | ICD-10-CM | POA: Diagnosis not present

## 2019-07-27 DIAGNOSIS — D649 Anemia, unspecified: Secondary | ICD-10-CM | POA: Diagnosis not present

## 2019-07-27 DIAGNOSIS — E1122 Type 2 diabetes mellitus with diabetic chronic kidney disease: Secondary | ICD-10-CM | POA: Diagnosis not present

## 2019-07-27 DIAGNOSIS — Z8719 Personal history of other diseases of the digestive system: Secondary | ICD-10-CM | POA: Diagnosis not present

## 2019-07-27 DIAGNOSIS — N186 End stage renal disease: Secondary | ICD-10-CM | POA: Diagnosis not present

## 2019-07-27 DIAGNOSIS — Z79899 Other long term (current) drug therapy: Secondary | ICD-10-CM | POA: Diagnosis not present

## 2019-07-27 DIAGNOSIS — I120 Hypertensive chronic kidney disease with stage 5 chronic kidney disease or end stage renal disease: Secondary | ICD-10-CM | POA: Diagnosis not present

## 2019-07-28 DIAGNOSIS — Z992 Dependence on renal dialysis: Secondary | ICD-10-CM | POA: Diagnosis not present

## 2019-07-28 DIAGNOSIS — N186 End stage renal disease: Secondary | ICD-10-CM | POA: Diagnosis not present

## 2019-07-29 DIAGNOSIS — N186 End stage renal disease: Secondary | ICD-10-CM | POA: Diagnosis not present

## 2019-07-29 DIAGNOSIS — Z992 Dependence on renal dialysis: Secondary | ICD-10-CM | POA: Diagnosis not present

## 2019-07-30 DIAGNOSIS — E119 Type 2 diabetes mellitus without complications: Secondary | ICD-10-CM | POA: Diagnosis not present

## 2019-07-30 DIAGNOSIS — N186 End stage renal disease: Secondary | ICD-10-CM | POA: Diagnosis not present

## 2019-07-30 DIAGNOSIS — D509 Iron deficiency anemia, unspecified: Secondary | ICD-10-CM | POA: Diagnosis not present

## 2019-07-30 DIAGNOSIS — Z992 Dependence on renal dialysis: Secondary | ICD-10-CM | POA: Diagnosis not present

## 2019-07-30 DIAGNOSIS — Z794 Long term (current) use of insulin: Secondary | ICD-10-CM | POA: Diagnosis not present

## 2019-07-31 DIAGNOSIS — N186 End stage renal disease: Secondary | ICD-10-CM | POA: Diagnosis not present

## 2019-07-31 DIAGNOSIS — Z992 Dependence on renal dialysis: Secondary | ICD-10-CM | POA: Diagnosis not present

## 2019-08-01 DIAGNOSIS — E1169 Type 2 diabetes mellitus with other specified complication: Secondary | ICD-10-CM | POA: Diagnosis not present

## 2019-08-01 DIAGNOSIS — E785 Hyperlipidemia, unspecified: Secondary | ICD-10-CM | POA: Diagnosis not present

## 2019-08-01 DIAGNOSIS — I152 Hypertension secondary to endocrine disorders: Secondary | ICD-10-CM | POA: Diagnosis not present

## 2019-08-01 DIAGNOSIS — Z992 Dependence on renal dialysis: Secondary | ICD-10-CM | POA: Diagnosis not present

## 2019-08-01 DIAGNOSIS — N186 End stage renal disease: Secondary | ICD-10-CM | POA: Diagnosis not present

## 2019-08-01 DIAGNOSIS — Z794 Long term (current) use of insulin: Secondary | ICD-10-CM | POA: Diagnosis not present

## 2019-08-01 DIAGNOSIS — E0590 Thyrotoxicosis, unspecified without thyrotoxic crisis or storm: Secondary | ICD-10-CM | POA: Diagnosis not present

## 2019-08-01 DIAGNOSIS — E1122 Type 2 diabetes mellitus with diabetic chronic kidney disease: Secondary | ICD-10-CM | POA: Diagnosis not present

## 2019-08-01 DIAGNOSIS — E1159 Type 2 diabetes mellitus with other circulatory complications: Secondary | ICD-10-CM | POA: Diagnosis not present

## 2019-08-02 DIAGNOSIS — N186 End stage renal disease: Secondary | ICD-10-CM | POA: Diagnosis not present

## 2019-08-02 DIAGNOSIS — Z992 Dependence on renal dialysis: Secondary | ICD-10-CM | POA: Diagnosis not present

## 2019-08-03 DIAGNOSIS — Z992 Dependence on renal dialysis: Secondary | ICD-10-CM | POA: Diagnosis not present

## 2019-08-03 DIAGNOSIS — N186 End stage renal disease: Secondary | ICD-10-CM | POA: Diagnosis not present

## 2019-08-04 DIAGNOSIS — Z992 Dependence on renal dialysis: Secondary | ICD-10-CM | POA: Diagnosis not present

## 2019-08-04 DIAGNOSIS — N186 End stage renal disease: Secondary | ICD-10-CM | POA: Diagnosis not present

## 2019-08-05 DIAGNOSIS — N186 End stage renal disease: Secondary | ICD-10-CM | POA: Diagnosis not present

## 2019-08-05 DIAGNOSIS — Z992 Dependence on renal dialysis: Secondary | ICD-10-CM | POA: Diagnosis not present

## 2019-08-06 DIAGNOSIS — E785 Hyperlipidemia, unspecified: Secondary | ICD-10-CM | POA: Diagnosis not present

## 2019-08-06 DIAGNOSIS — I152 Hypertension secondary to endocrine disorders: Secondary | ICD-10-CM | POA: Diagnosis not present

## 2019-08-06 DIAGNOSIS — E1169 Type 2 diabetes mellitus with other specified complication: Secondary | ICD-10-CM | POA: Diagnosis not present

## 2019-08-06 DIAGNOSIS — N184 Chronic kidney disease, stage 4 (severe): Secondary | ICD-10-CM | POA: Diagnosis not present

## 2019-08-06 DIAGNOSIS — R0602 Shortness of breath: Secondary | ICD-10-CM | POA: Diagnosis not present

## 2019-08-06 DIAGNOSIS — N186 End stage renal disease: Secondary | ICD-10-CM | POA: Diagnosis not present

## 2019-08-06 DIAGNOSIS — E1159 Type 2 diabetes mellitus with other circulatory complications: Secondary | ICD-10-CM | POA: Diagnosis not present

## 2019-08-06 DIAGNOSIS — R609 Edema, unspecified: Secondary | ICD-10-CM | POA: Diagnosis not present

## 2019-08-06 DIAGNOSIS — E876 Hypokalemia: Secondary | ICD-10-CM | POA: Diagnosis not present

## 2019-08-06 DIAGNOSIS — I5032 Chronic diastolic (congestive) heart failure: Secondary | ICD-10-CM | POA: Diagnosis not present

## 2019-08-06 DIAGNOSIS — Z992 Dependence on renal dialysis: Secondary | ICD-10-CM | POA: Diagnosis not present

## 2019-08-07 DIAGNOSIS — Z992 Dependence on renal dialysis: Secondary | ICD-10-CM | POA: Diagnosis not present

## 2019-08-07 DIAGNOSIS — N186 End stage renal disease: Secondary | ICD-10-CM | POA: Diagnosis not present

## 2019-08-08 DIAGNOSIS — N186 End stage renal disease: Secondary | ICD-10-CM | POA: Diagnosis not present

## 2019-08-08 DIAGNOSIS — Z992 Dependence on renal dialysis: Secondary | ICD-10-CM | POA: Diagnosis not present

## 2019-08-09 DIAGNOSIS — N186 End stage renal disease: Secondary | ICD-10-CM | POA: Diagnosis not present

## 2019-08-09 DIAGNOSIS — Z992 Dependence on renal dialysis: Secondary | ICD-10-CM | POA: Diagnosis not present

## 2019-08-10 DIAGNOSIS — N186 End stage renal disease: Secondary | ICD-10-CM | POA: Diagnosis not present

## 2019-08-10 DIAGNOSIS — Z992 Dependence on renal dialysis: Secondary | ICD-10-CM | POA: Diagnosis not present

## 2019-08-11 DIAGNOSIS — N186 End stage renal disease: Secondary | ICD-10-CM | POA: Diagnosis not present

## 2019-08-11 DIAGNOSIS — Z992 Dependence on renal dialysis: Secondary | ICD-10-CM | POA: Diagnosis not present

## 2019-08-12 DIAGNOSIS — Z992 Dependence on renal dialysis: Secondary | ICD-10-CM | POA: Diagnosis not present

## 2019-08-12 DIAGNOSIS — N186 End stage renal disease: Secondary | ICD-10-CM | POA: Diagnosis not present

## 2019-08-13 DIAGNOSIS — N186 End stage renal disease: Secondary | ICD-10-CM | POA: Diagnosis not present

## 2019-08-13 DIAGNOSIS — Z992 Dependence on renal dialysis: Secondary | ICD-10-CM | POA: Diagnosis not present

## 2019-08-14 DIAGNOSIS — D631 Anemia in chronic kidney disease: Secondary | ICD-10-CM | POA: Diagnosis not present

## 2019-08-14 DIAGNOSIS — N186 End stage renal disease: Secondary | ICD-10-CM | POA: Diagnosis not present

## 2019-08-14 DIAGNOSIS — I152 Hypertension secondary to endocrine disorders: Secondary | ICD-10-CM | POA: Diagnosis not present

## 2019-08-14 DIAGNOSIS — Z794 Long term (current) use of insulin: Secondary | ICD-10-CM | POA: Diagnosis not present

## 2019-08-14 DIAGNOSIS — N179 Acute kidney failure, unspecified: Secondary | ICD-10-CM | POA: Diagnosis not present

## 2019-08-14 DIAGNOSIS — E1122 Type 2 diabetes mellitus with diabetic chronic kidney disease: Secondary | ICD-10-CM | POA: Diagnosis not present

## 2019-08-14 DIAGNOSIS — E1159 Type 2 diabetes mellitus with other circulatory complications: Secondary | ICD-10-CM | POA: Diagnosis not present

## 2019-08-14 DIAGNOSIS — D649 Anemia, unspecified: Secondary | ICD-10-CM | POA: Diagnosis not present

## 2019-08-14 DIAGNOSIS — N185 Chronic kidney disease, stage 5: Secondary | ICD-10-CM | POA: Diagnosis not present

## 2019-08-14 DIAGNOSIS — Z992 Dependence on renal dialysis: Secondary | ICD-10-CM | POA: Diagnosis not present

## 2019-08-14 DIAGNOSIS — N184 Chronic kidney disease, stage 4 (severe): Secondary | ICD-10-CM | POA: Diagnosis not present

## 2019-08-15 DIAGNOSIS — K5909 Other constipation: Secondary | ICD-10-CM | POA: Diagnosis not present

## 2019-08-15 DIAGNOSIS — E1165 Type 2 diabetes mellitus with hyperglycemia: Secondary | ICD-10-CM | POA: Diagnosis not present

## 2019-08-15 DIAGNOSIS — Z992 Dependence on renal dialysis: Secondary | ICD-10-CM | POA: Diagnosis not present

## 2019-08-15 DIAGNOSIS — Z6837 Body mass index (BMI) 37.0-37.9, adult: Secondary | ICD-10-CM | POA: Diagnosis not present

## 2019-08-15 DIAGNOSIS — Z4902 Encounter for fitting and adjustment of peritoneal dialysis catheter: Secondary | ICD-10-CM | POA: Diagnosis not present

## 2019-08-15 DIAGNOSIS — N186 End stage renal disease: Secondary | ICD-10-CM | POA: Diagnosis not present

## 2019-08-15 DIAGNOSIS — K420 Umbilical hernia with obstruction, without gangrene: Secondary | ICD-10-CM | POA: Diagnosis not present

## 2019-08-16 DIAGNOSIS — Z992 Dependence on renal dialysis: Secondary | ICD-10-CM | POA: Diagnosis not present

## 2019-08-16 DIAGNOSIS — N186 End stage renal disease: Secondary | ICD-10-CM | POA: Diagnosis not present

## 2019-08-17 DIAGNOSIS — Z992 Dependence on renal dialysis: Secondary | ICD-10-CM | POA: Diagnosis not present

## 2019-08-17 DIAGNOSIS — N186 End stage renal disease: Secondary | ICD-10-CM | POA: Diagnosis not present

## 2019-08-18 DIAGNOSIS — Z992 Dependence on renal dialysis: Secondary | ICD-10-CM | POA: Diagnosis not present

## 2019-08-18 DIAGNOSIS — N186 End stage renal disease: Secondary | ICD-10-CM | POA: Diagnosis not present

## 2019-08-19 DIAGNOSIS — N186 End stage renal disease: Secondary | ICD-10-CM | POA: Diagnosis not present

## 2019-08-19 DIAGNOSIS — Z23 Encounter for immunization: Secondary | ICD-10-CM | POA: Diagnosis not present

## 2019-08-19 DIAGNOSIS — Z992 Dependence on renal dialysis: Secondary | ICD-10-CM | POA: Diagnosis not present

## 2019-08-20 DIAGNOSIS — I120 Hypertensive chronic kidney disease with stage 5 chronic kidney disease or end stage renal disease: Secondary | ICD-10-CM | POA: Diagnosis not present

## 2019-08-20 DIAGNOSIS — M79674 Pain in right toe(s): Secondary | ICD-10-CM | POA: Diagnosis not present

## 2019-08-20 DIAGNOSIS — Z992 Dependence on renal dialysis: Secondary | ICD-10-CM | POA: Diagnosis not present

## 2019-08-20 DIAGNOSIS — B351 Tinea unguium: Secondary | ICD-10-CM | POA: Diagnosis not present

## 2019-08-20 DIAGNOSIS — M79675 Pain in left toe(s): Secondary | ICD-10-CM | POA: Diagnosis not present

## 2019-08-20 DIAGNOSIS — Z23 Encounter for immunization: Secondary | ICD-10-CM | POA: Diagnosis not present

## 2019-08-20 DIAGNOSIS — E1122 Type 2 diabetes mellitus with diabetic chronic kidney disease: Secondary | ICD-10-CM | POA: Diagnosis not present

## 2019-08-20 DIAGNOSIS — L600 Ingrowing nail: Secondary | ICD-10-CM | POA: Diagnosis not present

## 2019-08-20 DIAGNOSIS — Z794 Long term (current) use of insulin: Secondary | ICD-10-CM | POA: Diagnosis not present

## 2019-08-20 DIAGNOSIS — E119 Type 2 diabetes mellitus without complications: Secondary | ICD-10-CM | POA: Diagnosis not present

## 2019-08-20 DIAGNOSIS — N186 End stage renal disease: Secondary | ICD-10-CM | POA: Diagnosis not present

## 2019-08-20 DIAGNOSIS — Z0000 Encounter for general adult medical examination without abnormal findings: Secondary | ICD-10-CM | POA: Diagnosis not present

## 2019-08-20 DIAGNOSIS — J449 Chronic obstructive pulmonary disease, unspecified: Secondary | ICD-10-CM | POA: Diagnosis not present

## 2019-08-21 DIAGNOSIS — Z23 Encounter for immunization: Secondary | ICD-10-CM | POA: Diagnosis not present

## 2019-08-21 DIAGNOSIS — Z992 Dependence on renal dialysis: Secondary | ICD-10-CM | POA: Diagnosis not present

## 2019-08-21 DIAGNOSIS — N186 End stage renal disease: Secondary | ICD-10-CM | POA: Diagnosis not present

## 2019-08-22 DIAGNOSIS — Z992 Dependence on renal dialysis: Secondary | ICD-10-CM | POA: Diagnosis not present

## 2019-08-22 DIAGNOSIS — N186 End stage renal disease: Secondary | ICD-10-CM | POA: Diagnosis not present

## 2019-08-22 DIAGNOSIS — R69 Illness, unspecified: Secondary | ICD-10-CM | POA: Diagnosis not present

## 2019-08-22 DIAGNOSIS — Z23 Encounter for immunization: Secondary | ICD-10-CM | POA: Diagnosis not present

## 2019-08-23 DIAGNOSIS — Z992 Dependence on renal dialysis: Secondary | ICD-10-CM | POA: Diagnosis not present

## 2019-08-23 DIAGNOSIS — N186 End stage renal disease: Secondary | ICD-10-CM | POA: Diagnosis not present

## 2019-08-23 DIAGNOSIS — Z23 Encounter for immunization: Secondary | ICD-10-CM | POA: Diagnosis not present

## 2019-08-24 DIAGNOSIS — Z23 Encounter for immunization: Secondary | ICD-10-CM | POA: Diagnosis not present

## 2019-08-24 DIAGNOSIS — N186 End stage renal disease: Secondary | ICD-10-CM | POA: Diagnosis not present

## 2019-08-24 DIAGNOSIS — Z992 Dependence on renal dialysis: Secondary | ICD-10-CM | POA: Diagnosis not present

## 2019-08-25 DIAGNOSIS — Z992 Dependence on renal dialysis: Secondary | ICD-10-CM | POA: Diagnosis not present

## 2019-08-25 DIAGNOSIS — Z23 Encounter for immunization: Secondary | ICD-10-CM | POA: Diagnosis not present

## 2019-08-25 DIAGNOSIS — N186 End stage renal disease: Secondary | ICD-10-CM | POA: Diagnosis not present

## 2019-08-26 DIAGNOSIS — Z992 Dependence on renal dialysis: Secondary | ICD-10-CM | POA: Diagnosis not present

## 2019-08-26 DIAGNOSIS — N186 End stage renal disease: Secondary | ICD-10-CM | POA: Diagnosis not present

## 2019-08-26 DIAGNOSIS — Z23 Encounter for immunization: Secondary | ICD-10-CM | POA: Diagnosis not present

## 2019-08-27 DIAGNOSIS — Z23 Encounter for immunization: Secondary | ICD-10-CM | POA: Diagnosis not present

## 2019-08-27 DIAGNOSIS — N186 End stage renal disease: Secondary | ICD-10-CM | POA: Diagnosis not present

## 2019-08-27 DIAGNOSIS — D509 Iron deficiency anemia, unspecified: Secondary | ICD-10-CM | POA: Diagnosis not present

## 2019-08-27 DIAGNOSIS — Z992 Dependence on renal dialysis: Secondary | ICD-10-CM | POA: Diagnosis not present

## 2019-08-28 DIAGNOSIS — N186 End stage renal disease: Secondary | ICD-10-CM | POA: Diagnosis not present

## 2019-08-28 DIAGNOSIS — Z23 Encounter for immunization: Secondary | ICD-10-CM | POA: Diagnosis not present

## 2019-08-28 DIAGNOSIS — Z992 Dependence on renal dialysis: Secondary | ICD-10-CM | POA: Diagnosis not present

## 2019-08-29 DIAGNOSIS — Z23 Encounter for immunization: Secondary | ICD-10-CM | POA: Diagnosis not present

## 2019-08-29 DIAGNOSIS — Z992 Dependence on renal dialysis: Secondary | ICD-10-CM | POA: Diagnosis not present

## 2019-08-29 DIAGNOSIS — N186 End stage renal disease: Secondary | ICD-10-CM | POA: Diagnosis not present

## 2019-08-30 DIAGNOSIS — N186 End stage renal disease: Secondary | ICD-10-CM | POA: Diagnosis not present

## 2019-08-30 DIAGNOSIS — Z992 Dependence on renal dialysis: Secondary | ICD-10-CM | POA: Diagnosis not present

## 2019-08-30 DIAGNOSIS — Z23 Encounter for immunization: Secondary | ICD-10-CM | POA: Diagnosis not present

## 2019-08-31 DIAGNOSIS — Z23 Encounter for immunization: Secondary | ICD-10-CM | POA: Diagnosis not present

## 2019-08-31 DIAGNOSIS — N186 End stage renal disease: Secondary | ICD-10-CM | POA: Diagnosis not present

## 2019-08-31 DIAGNOSIS — Z992 Dependence on renal dialysis: Secondary | ICD-10-CM | POA: Diagnosis not present

## 2019-09-01 DIAGNOSIS — Z23 Encounter for immunization: Secondary | ICD-10-CM | POA: Diagnosis not present

## 2019-09-01 DIAGNOSIS — N186 End stage renal disease: Secondary | ICD-10-CM | POA: Diagnosis not present

## 2019-09-01 DIAGNOSIS — Z992 Dependence on renal dialysis: Secondary | ICD-10-CM | POA: Diagnosis not present

## 2019-09-02 DIAGNOSIS — Z23 Encounter for immunization: Secondary | ICD-10-CM | POA: Diagnosis not present

## 2019-09-02 DIAGNOSIS — Z992 Dependence on renal dialysis: Secondary | ICD-10-CM | POA: Diagnosis not present

## 2019-09-02 DIAGNOSIS — N186 End stage renal disease: Secondary | ICD-10-CM | POA: Diagnosis not present

## 2019-09-03 DIAGNOSIS — Z23 Encounter for immunization: Secondary | ICD-10-CM | POA: Diagnosis not present

## 2019-09-03 DIAGNOSIS — Z992 Dependence on renal dialysis: Secondary | ICD-10-CM | POA: Diagnosis not present

## 2019-09-03 DIAGNOSIS — N186 End stage renal disease: Secondary | ICD-10-CM | POA: Diagnosis not present

## 2019-09-04 ENCOUNTER — Other Ambulatory Visit: Payer: Self-pay | Admitting: Internal Medicine

## 2019-09-04 DIAGNOSIS — Z23 Encounter for immunization: Secondary | ICD-10-CM | POA: Diagnosis not present

## 2019-09-04 DIAGNOSIS — N186 End stage renal disease: Secondary | ICD-10-CM | POA: Diagnosis not present

## 2019-09-04 DIAGNOSIS — Z992 Dependence on renal dialysis: Secondary | ICD-10-CM | POA: Diagnosis not present

## 2019-09-05 DIAGNOSIS — Z23 Encounter for immunization: Secondary | ICD-10-CM | POA: Diagnosis not present

## 2019-09-05 DIAGNOSIS — Z992 Dependence on renal dialysis: Secondary | ICD-10-CM | POA: Diagnosis not present

## 2019-09-05 DIAGNOSIS — N186 End stage renal disease: Secondary | ICD-10-CM | POA: Diagnosis not present

## 2019-09-06 DIAGNOSIS — Z992 Dependence on renal dialysis: Secondary | ICD-10-CM | POA: Diagnosis not present

## 2019-09-06 DIAGNOSIS — N186 End stage renal disease: Secondary | ICD-10-CM | POA: Diagnosis not present

## 2019-09-06 DIAGNOSIS — Z23 Encounter for immunization: Secondary | ICD-10-CM | POA: Diagnosis not present

## 2019-09-07 DIAGNOSIS — Z992 Dependence on renal dialysis: Secondary | ICD-10-CM | POA: Diagnosis not present

## 2019-09-07 DIAGNOSIS — Z23 Encounter for immunization: Secondary | ICD-10-CM | POA: Diagnosis not present

## 2019-09-07 DIAGNOSIS — N186 End stage renal disease: Secondary | ICD-10-CM | POA: Diagnosis not present

## 2019-09-08 DIAGNOSIS — N186 End stage renal disease: Secondary | ICD-10-CM | POA: Diagnosis not present

## 2019-09-08 DIAGNOSIS — Z992 Dependence on renal dialysis: Secondary | ICD-10-CM | POA: Diagnosis not present

## 2019-09-08 DIAGNOSIS — Z23 Encounter for immunization: Secondary | ICD-10-CM | POA: Diagnosis not present

## 2019-09-09 DIAGNOSIS — Z992 Dependence on renal dialysis: Secondary | ICD-10-CM | POA: Diagnosis not present

## 2019-09-09 DIAGNOSIS — N186 End stage renal disease: Secondary | ICD-10-CM | POA: Diagnosis not present

## 2019-09-09 DIAGNOSIS — Z23 Encounter for immunization: Secondary | ICD-10-CM | POA: Diagnosis not present

## 2019-09-10 DIAGNOSIS — Z23 Encounter for immunization: Secondary | ICD-10-CM | POA: Diagnosis not present

## 2019-09-10 DIAGNOSIS — Z992 Dependence on renal dialysis: Secondary | ICD-10-CM | POA: Diagnosis not present

## 2019-09-10 DIAGNOSIS — N186 End stage renal disease: Secondary | ICD-10-CM | POA: Diagnosis not present

## 2019-09-11 DIAGNOSIS — Z23 Encounter for immunization: Secondary | ICD-10-CM | POA: Diagnosis not present

## 2019-09-11 DIAGNOSIS — N186 End stage renal disease: Secondary | ICD-10-CM | POA: Diagnosis not present

## 2019-09-11 DIAGNOSIS — Z992 Dependence on renal dialysis: Secondary | ICD-10-CM | POA: Diagnosis not present

## 2019-09-12 DIAGNOSIS — Z23 Encounter for immunization: Secondary | ICD-10-CM | POA: Diagnosis not present

## 2019-09-12 DIAGNOSIS — Z992 Dependence on renal dialysis: Secondary | ICD-10-CM | POA: Diagnosis not present

## 2019-09-12 DIAGNOSIS — N186 End stage renal disease: Secondary | ICD-10-CM | POA: Diagnosis not present

## 2019-09-13 DIAGNOSIS — Z992 Dependence on renal dialysis: Secondary | ICD-10-CM | POA: Diagnosis not present

## 2019-09-13 DIAGNOSIS — N186 End stage renal disease: Secondary | ICD-10-CM | POA: Diagnosis not present

## 2019-09-13 DIAGNOSIS — Z23 Encounter for immunization: Secondary | ICD-10-CM | POA: Diagnosis not present

## 2019-09-14 DIAGNOSIS — Z23 Encounter for immunization: Secondary | ICD-10-CM | POA: Diagnosis not present

## 2019-09-14 DIAGNOSIS — Z992 Dependence on renal dialysis: Secondary | ICD-10-CM | POA: Diagnosis not present

## 2019-09-14 DIAGNOSIS — N186 End stage renal disease: Secondary | ICD-10-CM | POA: Diagnosis not present

## 2019-09-15 DIAGNOSIS — Z23 Encounter for immunization: Secondary | ICD-10-CM | POA: Diagnosis not present

## 2019-09-15 DIAGNOSIS — Z992 Dependence on renal dialysis: Secondary | ICD-10-CM | POA: Diagnosis not present

## 2019-09-15 DIAGNOSIS — N186 End stage renal disease: Secondary | ICD-10-CM | POA: Diagnosis not present

## 2019-09-16 DIAGNOSIS — Z992 Dependence on renal dialysis: Secondary | ICD-10-CM | POA: Diagnosis not present

## 2019-09-16 DIAGNOSIS — N186 End stage renal disease: Secondary | ICD-10-CM | POA: Diagnosis not present

## 2019-09-16 DIAGNOSIS — Z23 Encounter for immunization: Secondary | ICD-10-CM | POA: Diagnosis not present

## 2019-09-17 DIAGNOSIS — N186 End stage renal disease: Secondary | ICD-10-CM | POA: Diagnosis not present

## 2019-09-17 DIAGNOSIS — Z23 Encounter for immunization: Secondary | ICD-10-CM | POA: Diagnosis not present

## 2019-09-17 DIAGNOSIS — Z992 Dependence on renal dialysis: Secondary | ICD-10-CM | POA: Diagnosis not present

## 2019-09-18 DIAGNOSIS — N186 End stage renal disease: Secondary | ICD-10-CM | POA: Diagnosis not present

## 2019-09-18 DIAGNOSIS — Z23 Encounter for immunization: Secondary | ICD-10-CM | POA: Diagnosis not present

## 2019-09-18 DIAGNOSIS — Z992 Dependence on renal dialysis: Secondary | ICD-10-CM | POA: Diagnosis not present

## 2019-09-19 DIAGNOSIS — Z992 Dependence on renal dialysis: Secondary | ICD-10-CM | POA: Diagnosis not present

## 2019-09-19 DIAGNOSIS — N186 End stage renal disease: Secondary | ICD-10-CM | POA: Diagnosis not present

## 2019-09-19 DIAGNOSIS — Z23 Encounter for immunization: Secondary | ICD-10-CM | POA: Diagnosis not present

## 2019-09-20 DIAGNOSIS — Z992 Dependence on renal dialysis: Secondary | ICD-10-CM | POA: Diagnosis not present

## 2019-09-20 DIAGNOSIS — Z23 Encounter for immunization: Secondary | ICD-10-CM | POA: Diagnosis not present

## 2019-09-20 DIAGNOSIS — N186 End stage renal disease: Secondary | ICD-10-CM | POA: Diagnosis not present

## 2019-09-21 DIAGNOSIS — Z992 Dependence on renal dialysis: Secondary | ICD-10-CM | POA: Diagnosis not present

## 2019-09-21 DIAGNOSIS — Z23 Encounter for immunization: Secondary | ICD-10-CM | POA: Diagnosis not present

## 2019-09-21 DIAGNOSIS — N186 End stage renal disease: Secondary | ICD-10-CM | POA: Diagnosis not present

## 2019-09-22 DIAGNOSIS — Z992 Dependence on renal dialysis: Secondary | ICD-10-CM | POA: Diagnosis not present

## 2019-09-22 DIAGNOSIS — N186 End stage renal disease: Secondary | ICD-10-CM | POA: Diagnosis not present

## 2019-09-22 DIAGNOSIS — Z23 Encounter for immunization: Secondary | ICD-10-CM | POA: Diagnosis not present

## 2019-09-23 DIAGNOSIS — Z992 Dependence on renal dialysis: Secondary | ICD-10-CM | POA: Diagnosis not present

## 2019-09-23 DIAGNOSIS — Z23 Encounter for immunization: Secondary | ICD-10-CM | POA: Diagnosis not present

## 2019-09-23 DIAGNOSIS — N186 End stage renal disease: Secondary | ICD-10-CM | POA: Diagnosis not present

## 2019-09-24 DIAGNOSIS — Z23 Encounter for immunization: Secondary | ICD-10-CM | POA: Diagnosis not present

## 2019-09-24 DIAGNOSIS — N186 End stage renal disease: Secondary | ICD-10-CM | POA: Diagnosis not present

## 2019-09-24 DIAGNOSIS — Z992 Dependence on renal dialysis: Secondary | ICD-10-CM | POA: Diagnosis not present

## 2019-09-25 DIAGNOSIS — N186 End stage renal disease: Secondary | ICD-10-CM | POA: Diagnosis not present

## 2019-09-25 DIAGNOSIS — Z23 Encounter for immunization: Secondary | ICD-10-CM | POA: Diagnosis not present

## 2019-09-25 DIAGNOSIS — Z992 Dependence on renal dialysis: Secondary | ICD-10-CM | POA: Diagnosis not present

## 2019-09-26 DIAGNOSIS — N186 End stage renal disease: Secondary | ICD-10-CM | POA: Diagnosis not present

## 2019-09-26 DIAGNOSIS — Z992 Dependence on renal dialysis: Secondary | ICD-10-CM | POA: Diagnosis not present

## 2019-09-26 DIAGNOSIS — Z23 Encounter for immunization: Secondary | ICD-10-CM | POA: Diagnosis not present

## 2019-09-27 DIAGNOSIS — D509 Iron deficiency anemia, unspecified: Secondary | ICD-10-CM | POA: Diagnosis not present

## 2019-09-27 DIAGNOSIS — Z23 Encounter for immunization: Secondary | ICD-10-CM | POA: Diagnosis not present

## 2019-09-27 DIAGNOSIS — Z992 Dependence on renal dialysis: Secondary | ICD-10-CM | POA: Diagnosis not present

## 2019-09-27 DIAGNOSIS — N186 End stage renal disease: Secondary | ICD-10-CM | POA: Diagnosis not present

## 2019-09-28 DIAGNOSIS — N186 End stage renal disease: Secondary | ICD-10-CM | POA: Diagnosis not present

## 2019-09-28 DIAGNOSIS — Z23 Encounter for immunization: Secondary | ICD-10-CM | POA: Diagnosis not present

## 2019-09-28 DIAGNOSIS — Z992 Dependence on renal dialysis: Secondary | ICD-10-CM | POA: Diagnosis not present

## 2019-09-28 DIAGNOSIS — R69 Illness, unspecified: Secondary | ICD-10-CM | POA: Diagnosis not present

## 2019-09-29 DIAGNOSIS — N186 End stage renal disease: Secondary | ICD-10-CM | POA: Diagnosis not present

## 2019-09-29 DIAGNOSIS — Z992 Dependence on renal dialysis: Secondary | ICD-10-CM | POA: Diagnosis not present

## 2019-09-29 DIAGNOSIS — Z23 Encounter for immunization: Secondary | ICD-10-CM | POA: Diagnosis not present

## 2019-09-30 DIAGNOSIS — N186 End stage renal disease: Secondary | ICD-10-CM | POA: Diagnosis not present

## 2019-09-30 DIAGNOSIS — Z23 Encounter for immunization: Secondary | ICD-10-CM | POA: Diagnosis not present

## 2019-09-30 DIAGNOSIS — Z992 Dependence on renal dialysis: Secondary | ICD-10-CM | POA: Diagnosis not present

## 2019-10-01 DIAGNOSIS — N186 End stage renal disease: Secondary | ICD-10-CM | POA: Diagnosis not present

## 2019-10-01 DIAGNOSIS — Z992 Dependence on renal dialysis: Secondary | ICD-10-CM | POA: Diagnosis not present

## 2019-10-01 DIAGNOSIS — Z23 Encounter for immunization: Secondary | ICD-10-CM | POA: Diagnosis not present

## 2019-10-02 DIAGNOSIS — N186 End stage renal disease: Secondary | ICD-10-CM | POA: Diagnosis not present

## 2019-10-02 DIAGNOSIS — Z23 Encounter for immunization: Secondary | ICD-10-CM | POA: Diagnosis not present

## 2019-10-02 DIAGNOSIS — Z992 Dependence on renal dialysis: Secondary | ICD-10-CM | POA: Diagnosis not present

## 2019-10-03 DIAGNOSIS — N186 End stage renal disease: Secondary | ICD-10-CM | POA: Diagnosis not present

## 2019-10-03 DIAGNOSIS — Z23 Encounter for immunization: Secondary | ICD-10-CM | POA: Diagnosis not present

## 2019-10-03 DIAGNOSIS — Z992 Dependence on renal dialysis: Secondary | ICD-10-CM | POA: Diagnosis not present

## 2019-10-04 DIAGNOSIS — Z992 Dependence on renal dialysis: Secondary | ICD-10-CM | POA: Diagnosis not present

## 2019-10-04 DIAGNOSIS — N186 End stage renal disease: Secondary | ICD-10-CM | POA: Diagnosis not present

## 2019-10-04 DIAGNOSIS — Z23 Encounter for immunization: Secondary | ICD-10-CM | POA: Diagnosis not present

## 2019-10-05 DIAGNOSIS — N186 End stage renal disease: Secondary | ICD-10-CM | POA: Diagnosis not present

## 2019-10-05 DIAGNOSIS — Z23 Encounter for immunization: Secondary | ICD-10-CM | POA: Diagnosis not present

## 2019-10-05 DIAGNOSIS — Z992 Dependence on renal dialysis: Secondary | ICD-10-CM | POA: Diagnosis not present

## 2019-10-06 DIAGNOSIS — N186 End stage renal disease: Secondary | ICD-10-CM | POA: Diagnosis not present

## 2019-10-06 DIAGNOSIS — Z23 Encounter for immunization: Secondary | ICD-10-CM | POA: Diagnosis not present

## 2019-10-06 DIAGNOSIS — Z992 Dependence on renal dialysis: Secondary | ICD-10-CM | POA: Diagnosis not present

## 2019-10-07 DIAGNOSIS — Z23 Encounter for immunization: Secondary | ICD-10-CM | POA: Diagnosis not present

## 2019-10-07 DIAGNOSIS — N186 End stage renal disease: Secondary | ICD-10-CM | POA: Diagnosis not present

## 2019-10-07 DIAGNOSIS — Z992 Dependence on renal dialysis: Secondary | ICD-10-CM | POA: Diagnosis not present

## 2019-10-08 DIAGNOSIS — R001 Bradycardia, unspecified: Secondary | ICD-10-CM | POA: Diagnosis not present

## 2019-10-08 DIAGNOSIS — E1169 Type 2 diabetes mellitus with other specified complication: Secondary | ICD-10-CM | POA: Diagnosis not present

## 2019-10-08 DIAGNOSIS — N179 Acute kidney failure, unspecified: Secondary | ICD-10-CM | POA: Diagnosis not present

## 2019-10-08 DIAGNOSIS — N186 End stage renal disease: Secondary | ICD-10-CM | POA: Diagnosis not present

## 2019-10-08 DIAGNOSIS — R609 Edema, unspecified: Secondary | ICD-10-CM | POA: Diagnosis not present

## 2019-10-08 DIAGNOSIS — R0602 Shortness of breath: Secondary | ICD-10-CM | POA: Diagnosis not present

## 2019-10-08 DIAGNOSIS — I700 Atherosclerosis of aorta: Secondary | ICD-10-CM | POA: Diagnosis not present

## 2019-10-08 DIAGNOSIS — R55 Syncope and collapse: Secondary | ICD-10-CM | POA: Diagnosis not present

## 2019-10-08 DIAGNOSIS — Z23 Encounter for immunization: Secondary | ICD-10-CM | POA: Diagnosis not present

## 2019-10-08 DIAGNOSIS — N184 Chronic kidney disease, stage 4 (severe): Secondary | ICD-10-CM | POA: Diagnosis not present

## 2019-10-08 DIAGNOSIS — I152 Hypertension secondary to endocrine disorders: Secondary | ICD-10-CM | POA: Diagnosis not present

## 2019-10-08 DIAGNOSIS — E1159 Type 2 diabetes mellitus with other circulatory complications: Secondary | ICD-10-CM | POA: Diagnosis not present

## 2019-10-08 DIAGNOSIS — Z992 Dependence on renal dialysis: Secondary | ICD-10-CM | POA: Diagnosis not present

## 2019-10-09 DIAGNOSIS — N186 End stage renal disease: Secondary | ICD-10-CM | POA: Diagnosis not present

## 2019-10-09 DIAGNOSIS — Z992 Dependence on renal dialysis: Secondary | ICD-10-CM | POA: Diagnosis not present

## 2019-10-09 DIAGNOSIS — Z23 Encounter for immunization: Secondary | ICD-10-CM | POA: Diagnosis not present

## 2019-10-10 DIAGNOSIS — N186 End stage renal disease: Secondary | ICD-10-CM | POA: Diagnosis not present

## 2019-10-10 DIAGNOSIS — Z23 Encounter for immunization: Secondary | ICD-10-CM | POA: Diagnosis not present

## 2019-10-10 DIAGNOSIS — I120 Hypertensive chronic kidney disease with stage 5 chronic kidney disease or end stage renal disease: Secondary | ICD-10-CM | POA: Diagnosis not present

## 2019-10-10 DIAGNOSIS — R42 Dizziness and giddiness: Secondary | ICD-10-CM | POA: Diagnosis not present

## 2019-10-10 DIAGNOSIS — J449 Chronic obstructive pulmonary disease, unspecified: Secondary | ICD-10-CM | POA: Diagnosis not present

## 2019-10-10 DIAGNOSIS — Z794 Long term (current) use of insulin: Secondary | ICD-10-CM | POA: Diagnosis not present

## 2019-10-10 DIAGNOSIS — K219 Gastro-esophageal reflux disease without esophagitis: Secondary | ICD-10-CM | POA: Diagnosis not present

## 2019-10-10 DIAGNOSIS — Z992 Dependence on renal dialysis: Secondary | ICD-10-CM | POA: Diagnosis not present

## 2019-10-10 DIAGNOSIS — E1122 Type 2 diabetes mellitus with diabetic chronic kidney disease: Secondary | ICD-10-CM | POA: Diagnosis not present

## 2019-10-11 DIAGNOSIS — Z23 Encounter for immunization: Secondary | ICD-10-CM | POA: Diagnosis not present

## 2019-10-11 DIAGNOSIS — Z992 Dependence on renal dialysis: Secondary | ICD-10-CM | POA: Diagnosis not present

## 2019-10-11 DIAGNOSIS — N186 End stage renal disease: Secondary | ICD-10-CM | POA: Diagnosis not present

## 2019-10-12 DIAGNOSIS — Z992 Dependence on renal dialysis: Secondary | ICD-10-CM | POA: Diagnosis not present

## 2019-10-12 DIAGNOSIS — Z23 Encounter for immunization: Secondary | ICD-10-CM | POA: Diagnosis not present

## 2019-10-12 DIAGNOSIS — N186 End stage renal disease: Secondary | ICD-10-CM | POA: Diagnosis not present

## 2019-10-12 DIAGNOSIS — R69 Illness, unspecified: Secondary | ICD-10-CM | POA: Diagnosis not present

## 2019-10-13 DIAGNOSIS — N186 End stage renal disease: Secondary | ICD-10-CM | POA: Diagnosis not present

## 2019-10-13 DIAGNOSIS — Z992 Dependence on renal dialysis: Secondary | ICD-10-CM | POA: Diagnosis not present

## 2019-10-13 DIAGNOSIS — Z23 Encounter for immunization: Secondary | ICD-10-CM | POA: Diagnosis not present

## 2019-10-14 DIAGNOSIS — Z992 Dependence on renal dialysis: Secondary | ICD-10-CM | POA: Diagnosis not present

## 2019-10-14 DIAGNOSIS — Z23 Encounter for immunization: Secondary | ICD-10-CM | POA: Diagnosis not present

## 2019-10-14 DIAGNOSIS — N186 End stage renal disease: Secondary | ICD-10-CM | POA: Diagnosis not present

## 2019-10-15 DIAGNOSIS — N186 End stage renal disease: Secondary | ICD-10-CM | POA: Diagnosis not present

## 2019-10-15 DIAGNOSIS — Z992 Dependence on renal dialysis: Secondary | ICD-10-CM | POA: Diagnosis not present

## 2019-10-15 DIAGNOSIS — Z23 Encounter for immunization: Secondary | ICD-10-CM | POA: Diagnosis not present

## 2019-10-16 DIAGNOSIS — Z23 Encounter for immunization: Secondary | ICD-10-CM | POA: Diagnosis not present

## 2019-10-16 DIAGNOSIS — Z992 Dependence on renal dialysis: Secondary | ICD-10-CM | POA: Diagnosis not present

## 2019-10-16 DIAGNOSIS — N186 End stage renal disease: Secondary | ICD-10-CM | POA: Diagnosis not present

## 2019-10-17 DIAGNOSIS — N186 End stage renal disease: Secondary | ICD-10-CM | POA: Diagnosis not present

## 2019-10-17 DIAGNOSIS — Z992 Dependence on renal dialysis: Secondary | ICD-10-CM | POA: Diagnosis not present

## 2019-10-17 DIAGNOSIS — Z23 Encounter for immunization: Secondary | ICD-10-CM | POA: Diagnosis not present

## 2019-10-18 DIAGNOSIS — Z23 Encounter for immunization: Secondary | ICD-10-CM | POA: Diagnosis not present

## 2019-10-18 DIAGNOSIS — N186 End stage renal disease: Secondary | ICD-10-CM | POA: Diagnosis not present

## 2019-10-18 DIAGNOSIS — Z992 Dependence on renal dialysis: Secondary | ICD-10-CM | POA: Diagnosis not present

## 2019-10-19 DIAGNOSIS — Z23 Encounter for immunization: Secondary | ICD-10-CM | POA: Diagnosis not present

## 2019-10-19 DIAGNOSIS — N186 End stage renal disease: Secondary | ICD-10-CM | POA: Diagnosis not present

## 2019-10-19 DIAGNOSIS — Z992 Dependence on renal dialysis: Secondary | ICD-10-CM | POA: Diagnosis not present

## 2019-10-20 DIAGNOSIS — Z992 Dependence on renal dialysis: Secondary | ICD-10-CM | POA: Diagnosis not present

## 2019-10-20 DIAGNOSIS — N186 End stage renal disease: Secondary | ICD-10-CM | POA: Diagnosis not present

## 2019-10-20 DIAGNOSIS — Z23 Encounter for immunization: Secondary | ICD-10-CM | POA: Diagnosis not present

## 2019-10-21 DIAGNOSIS — N186 End stage renal disease: Secondary | ICD-10-CM | POA: Diagnosis not present

## 2019-10-21 DIAGNOSIS — Z992 Dependence on renal dialysis: Secondary | ICD-10-CM | POA: Diagnosis not present

## 2019-10-21 DIAGNOSIS — Z23 Encounter for immunization: Secondary | ICD-10-CM | POA: Diagnosis not present

## 2019-10-22 DIAGNOSIS — Z992 Dependence on renal dialysis: Secondary | ICD-10-CM | POA: Diagnosis not present

## 2019-10-22 DIAGNOSIS — Z23 Encounter for immunization: Secondary | ICD-10-CM | POA: Diagnosis not present

## 2019-10-22 DIAGNOSIS — N186 End stage renal disease: Secondary | ICD-10-CM | POA: Diagnosis not present

## 2019-10-23 DIAGNOSIS — N186 End stage renal disease: Secondary | ICD-10-CM | POA: Diagnosis not present

## 2019-10-23 DIAGNOSIS — Z992 Dependence on renal dialysis: Secondary | ICD-10-CM | POA: Diagnosis not present

## 2019-10-23 DIAGNOSIS — Z23 Encounter for immunization: Secondary | ICD-10-CM | POA: Diagnosis not present

## 2019-10-24 DIAGNOSIS — Z23 Encounter for immunization: Secondary | ICD-10-CM | POA: Diagnosis not present

## 2019-10-24 DIAGNOSIS — N186 End stage renal disease: Secondary | ICD-10-CM | POA: Diagnosis not present

## 2019-10-24 DIAGNOSIS — Z992 Dependence on renal dialysis: Secondary | ICD-10-CM | POA: Diagnosis not present

## 2019-10-25 DIAGNOSIS — Z992 Dependence on renal dialysis: Secondary | ICD-10-CM | POA: Diagnosis not present

## 2019-10-25 DIAGNOSIS — Z23 Encounter for immunization: Secondary | ICD-10-CM | POA: Diagnosis not present

## 2019-10-25 DIAGNOSIS — N186 End stage renal disease: Secondary | ICD-10-CM | POA: Diagnosis not present

## 2019-10-26 DIAGNOSIS — Z23 Encounter for immunization: Secondary | ICD-10-CM | POA: Diagnosis not present

## 2019-10-26 DIAGNOSIS — Z992 Dependence on renal dialysis: Secondary | ICD-10-CM | POA: Diagnosis not present

## 2019-10-26 DIAGNOSIS — N186 End stage renal disease: Secondary | ICD-10-CM | POA: Diagnosis not present

## 2019-10-27 DIAGNOSIS — Z992 Dependence on renal dialysis: Secondary | ICD-10-CM | POA: Diagnosis not present

## 2019-10-27 DIAGNOSIS — Z23 Encounter for immunization: Secondary | ICD-10-CM | POA: Diagnosis not present

## 2019-10-27 DIAGNOSIS — N186 End stage renal disease: Secondary | ICD-10-CM | POA: Diagnosis not present

## 2019-10-28 DIAGNOSIS — Z23 Encounter for immunization: Secondary | ICD-10-CM | POA: Diagnosis not present

## 2019-10-28 DIAGNOSIS — Z992 Dependence on renal dialysis: Secondary | ICD-10-CM | POA: Diagnosis not present

## 2019-10-28 DIAGNOSIS — N186 End stage renal disease: Secondary | ICD-10-CM | POA: Diagnosis not present

## 2019-10-29 DIAGNOSIS — Z23 Encounter for immunization: Secondary | ICD-10-CM | POA: Diagnosis not present

## 2019-10-29 DIAGNOSIS — N186 End stage renal disease: Secondary | ICD-10-CM | POA: Diagnosis not present

## 2019-10-29 DIAGNOSIS — Z992 Dependence on renal dialysis: Secondary | ICD-10-CM | POA: Diagnosis not present

## 2019-10-30 DIAGNOSIS — N186 End stage renal disease: Secondary | ICD-10-CM | POA: Diagnosis not present

## 2019-10-30 DIAGNOSIS — Z992 Dependence on renal dialysis: Secondary | ICD-10-CM | POA: Diagnosis not present

## 2019-10-30 DIAGNOSIS — Z23 Encounter for immunization: Secondary | ICD-10-CM | POA: Diagnosis not present

## 2019-10-31 DIAGNOSIS — N186 End stage renal disease: Secondary | ICD-10-CM | POA: Diagnosis not present

## 2019-10-31 DIAGNOSIS — Z23 Encounter for immunization: Secondary | ICD-10-CM | POA: Diagnosis not present

## 2019-10-31 DIAGNOSIS — Z992 Dependence on renal dialysis: Secondary | ICD-10-CM | POA: Diagnosis not present

## 2019-11-01 DIAGNOSIS — Z992 Dependence on renal dialysis: Secondary | ICD-10-CM | POA: Diagnosis not present

## 2019-11-01 DIAGNOSIS — N186 End stage renal disease: Secondary | ICD-10-CM | POA: Diagnosis not present

## 2019-11-01 DIAGNOSIS — Z23 Encounter for immunization: Secondary | ICD-10-CM | POA: Diagnosis not present

## 2019-11-02 DIAGNOSIS — Z23 Encounter for immunization: Secondary | ICD-10-CM | POA: Diagnosis not present

## 2019-11-02 DIAGNOSIS — N186 End stage renal disease: Secondary | ICD-10-CM | POA: Diagnosis not present

## 2019-11-02 DIAGNOSIS — Z992 Dependence on renal dialysis: Secondary | ICD-10-CM | POA: Diagnosis not present

## 2019-11-03 DIAGNOSIS — N186 End stage renal disease: Secondary | ICD-10-CM | POA: Diagnosis not present

## 2019-11-03 DIAGNOSIS — Z992 Dependence on renal dialysis: Secondary | ICD-10-CM | POA: Diagnosis not present

## 2019-11-03 DIAGNOSIS — Z23 Encounter for immunization: Secondary | ICD-10-CM | POA: Diagnosis not present

## 2019-11-04 DIAGNOSIS — R69 Illness, unspecified: Secondary | ICD-10-CM | POA: Diagnosis not present

## 2019-11-04 DIAGNOSIS — Z23 Encounter for immunization: Secondary | ICD-10-CM | POA: Diagnosis not present

## 2019-11-04 DIAGNOSIS — Z992 Dependence on renal dialysis: Secondary | ICD-10-CM | POA: Diagnosis not present

## 2019-11-04 DIAGNOSIS — N186 End stage renal disease: Secondary | ICD-10-CM | POA: Diagnosis not present

## 2019-11-05 DIAGNOSIS — Z992 Dependence on renal dialysis: Secondary | ICD-10-CM | POA: Diagnosis not present

## 2019-11-05 DIAGNOSIS — N186 End stage renal disease: Secondary | ICD-10-CM | POA: Diagnosis not present

## 2019-11-05 DIAGNOSIS — Z23 Encounter for immunization: Secondary | ICD-10-CM | POA: Diagnosis not present

## 2019-11-06 DIAGNOSIS — N186 End stage renal disease: Secondary | ICD-10-CM | POA: Diagnosis not present

## 2019-11-06 DIAGNOSIS — Z992 Dependence on renal dialysis: Secondary | ICD-10-CM | POA: Diagnosis not present

## 2019-11-06 DIAGNOSIS — Z23 Encounter for immunization: Secondary | ICD-10-CM | POA: Diagnosis not present

## 2019-11-07 DIAGNOSIS — Z992 Dependence on renal dialysis: Secondary | ICD-10-CM | POA: Diagnosis not present

## 2019-11-07 DIAGNOSIS — N186 End stage renal disease: Secondary | ICD-10-CM | POA: Diagnosis not present

## 2019-11-07 DIAGNOSIS — Z23 Encounter for immunization: Secondary | ICD-10-CM | POA: Diagnosis not present

## 2019-11-08 DIAGNOSIS — N186 End stage renal disease: Secondary | ICD-10-CM | POA: Diagnosis not present

## 2019-11-08 DIAGNOSIS — Z23 Encounter for immunization: Secondary | ICD-10-CM | POA: Diagnosis not present

## 2019-11-08 DIAGNOSIS — Z992 Dependence on renal dialysis: Secondary | ICD-10-CM | POA: Diagnosis not present

## 2019-11-09 DIAGNOSIS — D509 Iron deficiency anemia, unspecified: Secondary | ICD-10-CM | POA: Diagnosis not present

## 2019-11-09 DIAGNOSIS — Z794 Long term (current) use of insulin: Secondary | ICD-10-CM | POA: Diagnosis not present

## 2019-11-09 DIAGNOSIS — N186 End stage renal disease: Secondary | ICD-10-CM | POA: Diagnosis not present

## 2019-11-09 DIAGNOSIS — Z23 Encounter for immunization: Secondary | ICD-10-CM | POA: Diagnosis not present

## 2019-11-09 DIAGNOSIS — Z992 Dependence on renal dialysis: Secondary | ICD-10-CM | POA: Diagnosis not present

## 2019-11-09 DIAGNOSIS — E119 Type 2 diabetes mellitus without complications: Secondary | ICD-10-CM | POA: Diagnosis not present

## 2019-11-10 DIAGNOSIS — N186 End stage renal disease: Secondary | ICD-10-CM | POA: Diagnosis not present

## 2019-11-10 DIAGNOSIS — Z23 Encounter for immunization: Secondary | ICD-10-CM | POA: Diagnosis not present

## 2019-11-10 DIAGNOSIS — Z992 Dependence on renal dialysis: Secondary | ICD-10-CM | POA: Diagnosis not present

## 2019-11-11 DIAGNOSIS — N186 End stage renal disease: Secondary | ICD-10-CM | POA: Diagnosis not present

## 2019-11-11 DIAGNOSIS — Z992 Dependence on renal dialysis: Secondary | ICD-10-CM | POA: Diagnosis not present

## 2019-11-11 DIAGNOSIS — Z23 Encounter for immunization: Secondary | ICD-10-CM | POA: Diagnosis not present

## 2019-11-12 DIAGNOSIS — N186 End stage renal disease: Secondary | ICD-10-CM | POA: Diagnosis not present

## 2019-11-12 DIAGNOSIS — Z992 Dependence on renal dialysis: Secondary | ICD-10-CM | POA: Diagnosis not present

## 2019-11-12 DIAGNOSIS — Z23 Encounter for immunization: Secondary | ICD-10-CM | POA: Diagnosis not present

## 2019-11-13 DIAGNOSIS — Z992 Dependence on renal dialysis: Secondary | ICD-10-CM | POA: Diagnosis not present

## 2019-11-13 DIAGNOSIS — N186 End stage renal disease: Secondary | ICD-10-CM | POA: Diagnosis not present

## 2019-11-13 DIAGNOSIS — Z23 Encounter for immunization: Secondary | ICD-10-CM | POA: Diagnosis not present

## 2019-11-14 DIAGNOSIS — Z992 Dependence on renal dialysis: Secondary | ICD-10-CM | POA: Diagnosis not present

## 2019-11-14 DIAGNOSIS — N186 End stage renal disease: Secondary | ICD-10-CM | POA: Diagnosis not present

## 2019-11-14 DIAGNOSIS — Z23 Encounter for immunization: Secondary | ICD-10-CM | POA: Diagnosis not present

## 2019-11-15 DIAGNOSIS — Z23 Encounter for immunization: Secondary | ICD-10-CM | POA: Diagnosis not present

## 2019-11-15 DIAGNOSIS — N186 End stage renal disease: Secondary | ICD-10-CM | POA: Diagnosis not present

## 2019-11-15 DIAGNOSIS — Z992 Dependence on renal dialysis: Secondary | ICD-10-CM | POA: Diagnosis not present

## 2019-11-16 DIAGNOSIS — Z6837 Body mass index (BMI) 37.0-37.9, adult: Secondary | ICD-10-CM | POA: Diagnosis not present

## 2019-11-16 DIAGNOSIS — T85611D Breakdown (mechanical) of intraperitoneal dialysis catheter, subsequent encounter: Secondary | ICD-10-CM | POA: Diagnosis not present

## 2019-11-16 DIAGNOSIS — E1165 Type 2 diabetes mellitus with hyperglycemia: Secondary | ICD-10-CM | POA: Diagnosis not present

## 2019-11-16 DIAGNOSIS — N186 End stage renal disease: Secondary | ICD-10-CM | POA: Diagnosis not present

## 2019-11-16 DIAGNOSIS — Z23 Encounter for immunization: Secondary | ICD-10-CM | POA: Diagnosis not present

## 2019-11-16 DIAGNOSIS — K420 Umbilical hernia with obstruction, without gangrene: Secondary | ICD-10-CM | POA: Diagnosis not present

## 2019-11-16 DIAGNOSIS — Z992 Dependence on renal dialysis: Secondary | ICD-10-CM | POA: Diagnosis not present

## 2019-11-17 DIAGNOSIS — Z992 Dependence on renal dialysis: Secondary | ICD-10-CM | POA: Diagnosis not present

## 2019-11-17 DIAGNOSIS — N186 End stage renal disease: Secondary | ICD-10-CM | POA: Diagnosis not present

## 2019-11-17 DIAGNOSIS — Z23 Encounter for immunization: Secondary | ICD-10-CM | POA: Diagnosis not present

## 2019-11-18 DIAGNOSIS — Z992 Dependence on renal dialysis: Secondary | ICD-10-CM | POA: Diagnosis not present

## 2019-11-18 DIAGNOSIS — Z23 Encounter for immunization: Secondary | ICD-10-CM | POA: Diagnosis not present

## 2019-11-18 DIAGNOSIS — N186 End stage renal disease: Secondary | ICD-10-CM | POA: Diagnosis not present

## 2019-11-19 DIAGNOSIS — N186 End stage renal disease: Secondary | ICD-10-CM | POA: Diagnosis not present

## 2019-11-19 DIAGNOSIS — Z992 Dependence on renal dialysis: Secondary | ICD-10-CM | POA: Diagnosis not present

## 2019-11-20 DIAGNOSIS — Z992 Dependence on renal dialysis: Secondary | ICD-10-CM | POA: Diagnosis not present

## 2019-11-20 DIAGNOSIS — N186 End stage renal disease: Secondary | ICD-10-CM | POA: Diagnosis not present

## 2019-11-21 DIAGNOSIS — N186 End stage renal disease: Secondary | ICD-10-CM | POA: Diagnosis not present

## 2019-11-21 DIAGNOSIS — Z992 Dependence on renal dialysis: Secondary | ICD-10-CM | POA: Diagnosis not present

## 2019-11-22 DIAGNOSIS — N186 End stage renal disease: Secondary | ICD-10-CM | POA: Diagnosis not present

## 2019-11-22 DIAGNOSIS — Z992 Dependence on renal dialysis: Secondary | ICD-10-CM | POA: Diagnosis not present

## 2019-11-23 DIAGNOSIS — N186 End stage renal disease: Secondary | ICD-10-CM | POA: Diagnosis not present

## 2019-11-23 DIAGNOSIS — D509 Iron deficiency anemia, unspecified: Secondary | ICD-10-CM | POA: Diagnosis not present

## 2019-11-23 DIAGNOSIS — Z992 Dependence on renal dialysis: Secondary | ICD-10-CM | POA: Diagnosis not present

## 2019-11-24 DIAGNOSIS — N186 End stage renal disease: Secondary | ICD-10-CM | POA: Diagnosis not present

## 2019-11-24 DIAGNOSIS — Z992 Dependence on renal dialysis: Secondary | ICD-10-CM | POA: Diagnosis not present

## 2019-11-25 DIAGNOSIS — Z992 Dependence on renal dialysis: Secondary | ICD-10-CM | POA: Diagnosis not present

## 2019-11-25 DIAGNOSIS — N186 End stage renal disease: Secondary | ICD-10-CM | POA: Diagnosis not present

## 2019-11-26 DIAGNOSIS — Z992 Dependence on renal dialysis: Secondary | ICD-10-CM | POA: Diagnosis not present

## 2019-11-26 DIAGNOSIS — N186 End stage renal disease: Secondary | ICD-10-CM | POA: Diagnosis not present

## 2019-11-27 DIAGNOSIS — Z992 Dependence on renal dialysis: Secondary | ICD-10-CM | POA: Diagnosis not present

## 2019-11-27 DIAGNOSIS — N186 End stage renal disease: Secondary | ICD-10-CM | POA: Diagnosis not present

## 2019-11-28 DIAGNOSIS — Z992 Dependence on renal dialysis: Secondary | ICD-10-CM | POA: Diagnosis not present

## 2019-11-28 DIAGNOSIS — N186 End stage renal disease: Secondary | ICD-10-CM | POA: Diagnosis not present

## 2019-11-29 DIAGNOSIS — N186 End stage renal disease: Secondary | ICD-10-CM | POA: Diagnosis not present

## 2019-11-29 DIAGNOSIS — Z992 Dependence on renal dialysis: Secondary | ICD-10-CM | POA: Diagnosis not present

## 2019-11-30 DIAGNOSIS — N186 End stage renal disease: Secondary | ICD-10-CM | POA: Diagnosis not present

## 2019-11-30 DIAGNOSIS — Z992 Dependence on renal dialysis: Secondary | ICD-10-CM | POA: Diagnosis not present

## 2019-12-01 DIAGNOSIS — N186 End stage renal disease: Secondary | ICD-10-CM | POA: Diagnosis not present

## 2019-12-01 DIAGNOSIS — Z992 Dependence on renal dialysis: Secondary | ICD-10-CM | POA: Diagnosis not present

## 2019-12-02 DIAGNOSIS — Z992 Dependence on renal dialysis: Secondary | ICD-10-CM | POA: Diagnosis not present

## 2019-12-02 DIAGNOSIS — N186 End stage renal disease: Secondary | ICD-10-CM | POA: Diagnosis not present

## 2019-12-03 DIAGNOSIS — N186 End stage renal disease: Secondary | ICD-10-CM | POA: Diagnosis not present

## 2019-12-03 DIAGNOSIS — Z992 Dependence on renal dialysis: Secondary | ICD-10-CM | POA: Diagnosis not present

## 2019-12-04 DIAGNOSIS — Z992 Dependence on renal dialysis: Secondary | ICD-10-CM | POA: Diagnosis not present

## 2019-12-04 DIAGNOSIS — N186 End stage renal disease: Secondary | ICD-10-CM | POA: Diagnosis not present

## 2019-12-05 DIAGNOSIS — N186 End stage renal disease: Secondary | ICD-10-CM | POA: Diagnosis not present

## 2019-12-05 DIAGNOSIS — Z992 Dependence on renal dialysis: Secondary | ICD-10-CM | POA: Diagnosis not present

## 2019-12-06 DIAGNOSIS — Z794 Long term (current) use of insulin: Secondary | ICD-10-CM | POA: Diagnosis not present

## 2019-12-06 DIAGNOSIS — I152 Hypertension secondary to endocrine disorders: Secondary | ICD-10-CM | POA: Diagnosis not present

## 2019-12-06 DIAGNOSIS — N186 End stage renal disease: Secondary | ICD-10-CM | POA: Diagnosis not present

## 2019-12-06 DIAGNOSIS — E785 Hyperlipidemia, unspecified: Secondary | ICD-10-CM | POA: Diagnosis not present

## 2019-12-06 DIAGNOSIS — E1122 Type 2 diabetes mellitus with diabetic chronic kidney disease: Secondary | ICD-10-CM | POA: Diagnosis not present

## 2019-12-06 DIAGNOSIS — Z992 Dependence on renal dialysis: Secondary | ICD-10-CM | POA: Diagnosis not present

## 2019-12-06 DIAGNOSIS — E1169 Type 2 diabetes mellitus with other specified complication: Secondary | ICD-10-CM | POA: Diagnosis not present

## 2019-12-06 DIAGNOSIS — E1159 Type 2 diabetes mellitus with other circulatory complications: Secondary | ICD-10-CM | POA: Diagnosis not present

## 2019-12-06 DIAGNOSIS — E559 Vitamin D deficiency, unspecified: Secondary | ICD-10-CM | POA: Diagnosis not present

## 2019-12-07 DIAGNOSIS — N186 End stage renal disease: Secondary | ICD-10-CM | POA: Diagnosis not present

## 2019-12-07 DIAGNOSIS — Z992 Dependence on renal dialysis: Secondary | ICD-10-CM | POA: Diagnosis not present

## 2019-12-08 DIAGNOSIS — Z992 Dependence on renal dialysis: Secondary | ICD-10-CM | POA: Diagnosis not present

## 2019-12-08 DIAGNOSIS — N186 End stage renal disease: Secondary | ICD-10-CM | POA: Diagnosis not present

## 2019-12-09 DIAGNOSIS — Z992 Dependence on renal dialysis: Secondary | ICD-10-CM | POA: Diagnosis not present

## 2019-12-09 DIAGNOSIS — N186 End stage renal disease: Secondary | ICD-10-CM | POA: Diagnosis not present

## 2019-12-10 DIAGNOSIS — N186 End stage renal disease: Secondary | ICD-10-CM | POA: Diagnosis not present

## 2019-12-10 DIAGNOSIS — Z992 Dependence on renal dialysis: Secondary | ICD-10-CM | POA: Diagnosis not present

## 2019-12-11 DIAGNOSIS — N186 End stage renal disease: Secondary | ICD-10-CM | POA: Diagnosis not present

## 2019-12-11 DIAGNOSIS — Z992 Dependence on renal dialysis: Secondary | ICD-10-CM | POA: Diagnosis not present

## 2019-12-12 DIAGNOSIS — N186 End stage renal disease: Secondary | ICD-10-CM | POA: Diagnosis not present

## 2019-12-12 DIAGNOSIS — Z992 Dependence on renal dialysis: Secondary | ICD-10-CM | POA: Diagnosis not present

## 2019-12-12 DIAGNOSIS — K420 Umbilical hernia with obstruction, without gangrene: Secondary | ICD-10-CM | POA: Diagnosis not present

## 2019-12-13 DIAGNOSIS — N186 End stage renal disease: Secondary | ICD-10-CM | POA: Diagnosis not present

## 2019-12-13 DIAGNOSIS — Z992 Dependence on renal dialysis: Secondary | ICD-10-CM | POA: Diagnosis not present

## 2019-12-14 DIAGNOSIS — N186 End stage renal disease: Secondary | ICD-10-CM | POA: Diagnosis not present

## 2019-12-14 DIAGNOSIS — Z992 Dependence on renal dialysis: Secondary | ICD-10-CM | POA: Diagnosis not present

## 2019-12-15 DIAGNOSIS — N186 End stage renal disease: Secondary | ICD-10-CM | POA: Diagnosis not present

## 2019-12-15 DIAGNOSIS — Z992 Dependence on renal dialysis: Secondary | ICD-10-CM | POA: Diagnosis not present

## 2019-12-16 DIAGNOSIS — N186 End stage renal disease: Secondary | ICD-10-CM | POA: Diagnosis not present

## 2019-12-16 DIAGNOSIS — Z992 Dependence on renal dialysis: Secondary | ICD-10-CM | POA: Diagnosis not present

## 2019-12-17 DIAGNOSIS — N186 End stage renal disease: Secondary | ICD-10-CM | POA: Diagnosis not present

## 2019-12-17 DIAGNOSIS — K430 Incisional hernia with obstruction, without gangrene: Secondary | ICD-10-CM | POA: Diagnosis not present

## 2019-12-17 DIAGNOSIS — Z992 Dependence on renal dialysis: Secondary | ICD-10-CM | POA: Diagnosis not present

## 2019-12-17 DIAGNOSIS — E1165 Type 2 diabetes mellitus with hyperglycemia: Secondary | ICD-10-CM | POA: Diagnosis not present

## 2019-12-17 DIAGNOSIS — K420 Umbilical hernia with obstruction, without gangrene: Secondary | ICD-10-CM | POA: Diagnosis not present

## 2019-12-17 HISTORY — PX: LAPAROSCOPIC INCISIONAL / UMBILICAL / VENTRAL HERNIA REPAIR: SUR789

## 2019-12-18 DIAGNOSIS — Z992 Dependence on renal dialysis: Secondary | ICD-10-CM | POA: Diagnosis not present

## 2019-12-18 DIAGNOSIS — N186 End stage renal disease: Secondary | ICD-10-CM | POA: Diagnosis not present

## 2019-12-19 DIAGNOSIS — N186 End stage renal disease: Secondary | ICD-10-CM | POA: Diagnosis not present

## 2019-12-19 DIAGNOSIS — Z992 Dependence on renal dialysis: Secondary | ICD-10-CM | POA: Diagnosis not present

## 2019-12-20 DIAGNOSIS — N186 End stage renal disease: Secondary | ICD-10-CM | POA: Diagnosis not present

## 2019-12-20 DIAGNOSIS — Z992 Dependence on renal dialysis: Secondary | ICD-10-CM | POA: Diagnosis not present

## 2019-12-21 DIAGNOSIS — N186 End stage renal disease: Secondary | ICD-10-CM | POA: Diagnosis not present

## 2019-12-21 DIAGNOSIS — Z992 Dependence on renal dialysis: Secondary | ICD-10-CM | POA: Diagnosis not present

## 2019-12-22 DIAGNOSIS — N186 End stage renal disease: Secondary | ICD-10-CM | POA: Diagnosis not present

## 2019-12-22 DIAGNOSIS — Z992 Dependence on renal dialysis: Secondary | ICD-10-CM | POA: Diagnosis not present

## 2019-12-23 DIAGNOSIS — N186 End stage renal disease: Secondary | ICD-10-CM | POA: Diagnosis not present

## 2019-12-23 DIAGNOSIS — Z992 Dependence on renal dialysis: Secondary | ICD-10-CM | POA: Diagnosis not present

## 2019-12-24 DIAGNOSIS — Z992 Dependence on renal dialysis: Secondary | ICD-10-CM | POA: Diagnosis not present

## 2019-12-24 DIAGNOSIS — R69 Illness, unspecified: Secondary | ICD-10-CM | POA: Diagnosis not present

## 2019-12-24 DIAGNOSIS — N186 End stage renal disease: Secondary | ICD-10-CM | POA: Diagnosis not present

## 2019-12-25 DIAGNOSIS — Z992 Dependence on renal dialysis: Secondary | ICD-10-CM | POA: Diagnosis not present

## 2019-12-25 DIAGNOSIS — N186 End stage renal disease: Secondary | ICD-10-CM | POA: Diagnosis not present

## 2019-12-26 DIAGNOSIS — N186 End stage renal disease: Secondary | ICD-10-CM | POA: Diagnosis not present

## 2019-12-26 DIAGNOSIS — Z992 Dependence on renal dialysis: Secondary | ICD-10-CM | POA: Diagnosis not present

## 2019-12-27 DIAGNOSIS — N186 End stage renal disease: Secondary | ICD-10-CM | POA: Diagnosis not present

## 2019-12-27 DIAGNOSIS — Z992 Dependence on renal dialysis: Secondary | ICD-10-CM | POA: Diagnosis not present

## 2019-12-28 DIAGNOSIS — D509 Iron deficiency anemia, unspecified: Secondary | ICD-10-CM | POA: Diagnosis not present

## 2019-12-28 DIAGNOSIS — Z85828 Personal history of other malignant neoplasm of skin: Secondary | ICD-10-CM | POA: Diagnosis not present

## 2019-12-28 DIAGNOSIS — E1122 Type 2 diabetes mellitus with diabetic chronic kidney disease: Secondary | ICD-10-CM | POA: Diagnosis not present

## 2019-12-28 DIAGNOSIS — Z794 Long term (current) use of insulin: Secondary | ICD-10-CM | POA: Diagnosis not present

## 2019-12-28 DIAGNOSIS — I120 Hypertensive chronic kidney disease with stage 5 chronic kidney disease or end stage renal disease: Secondary | ICD-10-CM | POA: Diagnosis not present

## 2019-12-28 DIAGNOSIS — R0602 Shortness of breath: Secondary | ICD-10-CM | POA: Diagnosis not present

## 2019-12-28 DIAGNOSIS — N186 End stage renal disease: Secondary | ICD-10-CM | POA: Diagnosis not present

## 2019-12-28 DIAGNOSIS — Z992 Dependence on renal dialysis: Secondary | ICD-10-CM | POA: Diagnosis not present

## 2019-12-28 DIAGNOSIS — Z9889 Other specified postprocedural states: Secondary | ICD-10-CM | POA: Diagnosis not present

## 2019-12-28 DIAGNOSIS — J449 Chronic obstructive pulmonary disease, unspecified: Secondary | ICD-10-CM | POA: Diagnosis not present

## 2019-12-28 DIAGNOSIS — Z0000 Encounter for general adult medical examination without abnormal findings: Secondary | ICD-10-CM | POA: Diagnosis not present

## 2019-12-29 DIAGNOSIS — N186 End stage renal disease: Secondary | ICD-10-CM | POA: Diagnosis not present

## 2019-12-29 DIAGNOSIS — Z992 Dependence on renal dialysis: Secondary | ICD-10-CM | POA: Diagnosis not present

## 2019-12-30 DIAGNOSIS — N186 End stage renal disease: Secondary | ICD-10-CM | POA: Diagnosis not present

## 2019-12-30 DIAGNOSIS — Z992 Dependence on renal dialysis: Secondary | ICD-10-CM | POA: Diagnosis not present

## 2019-12-31 DIAGNOSIS — N186 End stage renal disease: Secondary | ICD-10-CM | POA: Diagnosis not present

## 2019-12-31 DIAGNOSIS — Z992 Dependence on renal dialysis: Secondary | ICD-10-CM | POA: Diagnosis not present

## 2020-01-01 DIAGNOSIS — N186 End stage renal disease: Secondary | ICD-10-CM | POA: Diagnosis not present

## 2020-01-01 DIAGNOSIS — Z992 Dependence on renal dialysis: Secondary | ICD-10-CM | POA: Diagnosis not present

## 2020-01-02 DIAGNOSIS — N186 End stage renal disease: Secondary | ICD-10-CM | POA: Diagnosis not present

## 2020-01-02 DIAGNOSIS — Z992 Dependence on renal dialysis: Secondary | ICD-10-CM | POA: Diagnosis not present

## 2020-01-03 DIAGNOSIS — N186 End stage renal disease: Secondary | ICD-10-CM | POA: Diagnosis not present

## 2020-01-03 DIAGNOSIS — Z992 Dependence on renal dialysis: Secondary | ICD-10-CM | POA: Diagnosis not present

## 2020-01-04 DIAGNOSIS — N186 End stage renal disease: Secondary | ICD-10-CM | POA: Diagnosis not present

## 2020-01-04 DIAGNOSIS — Z992 Dependence on renal dialysis: Secondary | ICD-10-CM | POA: Diagnosis not present

## 2020-01-05 DIAGNOSIS — Z992 Dependence on renal dialysis: Secondary | ICD-10-CM | POA: Diagnosis not present

## 2020-01-05 DIAGNOSIS — N186 End stage renal disease: Secondary | ICD-10-CM | POA: Diagnosis not present

## 2020-01-06 DIAGNOSIS — N186 End stage renal disease: Secondary | ICD-10-CM | POA: Diagnosis not present

## 2020-01-06 DIAGNOSIS — Z992 Dependence on renal dialysis: Secondary | ICD-10-CM | POA: Diagnosis not present

## 2020-01-07 DIAGNOSIS — N186 End stage renal disease: Secondary | ICD-10-CM | POA: Diagnosis not present

## 2020-01-07 DIAGNOSIS — Z992 Dependence on renal dialysis: Secondary | ICD-10-CM | POA: Diagnosis not present

## 2020-01-08 DIAGNOSIS — Z992 Dependence on renal dialysis: Secondary | ICD-10-CM | POA: Diagnosis not present

## 2020-01-08 DIAGNOSIS — N186 End stage renal disease: Secondary | ICD-10-CM | POA: Diagnosis not present

## 2020-01-08 DIAGNOSIS — Z4902 Encounter for fitting and adjustment of peritoneal dialysis catheter: Secondary | ICD-10-CM | POA: Diagnosis not present

## 2020-01-08 DIAGNOSIS — T85611D Breakdown (mechanical) of intraperitoneal dialysis catheter, subsequent encounter: Secondary | ICD-10-CM | POA: Diagnosis not present

## 2020-01-09 DIAGNOSIS — Z992 Dependence on renal dialysis: Secondary | ICD-10-CM | POA: Diagnosis not present

## 2020-01-09 DIAGNOSIS — N186 End stage renal disease: Secondary | ICD-10-CM | POA: Diagnosis not present

## 2020-01-10 DIAGNOSIS — N186 End stage renal disease: Secondary | ICD-10-CM | POA: Diagnosis not present

## 2020-01-10 DIAGNOSIS — Z992 Dependence on renal dialysis: Secondary | ICD-10-CM | POA: Diagnosis not present

## 2020-01-11 DIAGNOSIS — Z992 Dependence on renal dialysis: Secondary | ICD-10-CM | POA: Diagnosis not present

## 2020-01-11 DIAGNOSIS — N186 End stage renal disease: Secondary | ICD-10-CM | POA: Diagnosis not present

## 2020-01-12 DIAGNOSIS — N186 End stage renal disease: Secondary | ICD-10-CM | POA: Diagnosis not present

## 2020-01-12 DIAGNOSIS — Z992 Dependence on renal dialysis: Secondary | ICD-10-CM | POA: Diagnosis not present

## 2020-01-13 DIAGNOSIS — N186 End stage renal disease: Secondary | ICD-10-CM | POA: Diagnosis not present

## 2020-01-13 DIAGNOSIS — Z992 Dependence on renal dialysis: Secondary | ICD-10-CM | POA: Diagnosis not present

## 2020-01-14 DIAGNOSIS — N186 End stage renal disease: Secondary | ICD-10-CM | POA: Diagnosis not present

## 2020-01-14 DIAGNOSIS — Z992 Dependence on renal dialysis: Secondary | ICD-10-CM | POA: Diagnosis not present

## 2020-01-15 DIAGNOSIS — Z992 Dependence on renal dialysis: Secondary | ICD-10-CM | POA: Diagnosis not present

## 2020-01-15 DIAGNOSIS — N186 End stage renal disease: Secondary | ICD-10-CM | POA: Diagnosis not present

## 2020-01-16 DIAGNOSIS — N186 End stage renal disease: Secondary | ICD-10-CM | POA: Diagnosis not present

## 2020-01-16 DIAGNOSIS — Z992 Dependence on renal dialysis: Secondary | ICD-10-CM | POA: Diagnosis not present

## 2020-01-17 DIAGNOSIS — Z992 Dependence on renal dialysis: Secondary | ICD-10-CM | POA: Diagnosis not present

## 2020-01-17 DIAGNOSIS — N186 End stage renal disease: Secondary | ICD-10-CM | POA: Diagnosis not present

## 2020-01-18 DIAGNOSIS — Z992 Dependence on renal dialysis: Secondary | ICD-10-CM | POA: Diagnosis not present

## 2020-01-18 DIAGNOSIS — N186 End stage renal disease: Secondary | ICD-10-CM | POA: Diagnosis not present

## 2020-04-23 NOTE — Progress Notes (Signed)
HPI female former smoker followed for allergic rhinitis, asthma/COPD, history urticaria, complicated by , DM, GERD, history  CVA 2004.MRI brain 2015. Kidney cancer/ partial nephrectomy 2015  PFT 10/26/2011 mild obstructive airways disease, small airways, insignificant response to bronchodilator. Diffusion moderately reduced. FVC 2.46/92%, FEV1 1.76/94%, FEV1/FVC 0.71, FEF 25/75% 1.13/52%. TLC 86%, DLCO 46%. 6 MWT-99%, 97%, 99%, 297.5 m with complaint of right calf pain but no oxygen limitation V/Q scan 12/29/17- No appreciable ventilation or perfusion defects. Very low probability of pulmonary embolus. CXR 12/29/17- No acute disease. US venous ultrasound-12/27/17- 1. No femoropopliteal and no calf DVT in the visualized calf veins. If clinical symptoms are inconsistent or if there are persistent or worsening symptoms, further imaging (possibly involving the iliac veins) may be warranted. 2. Small left Baker's cyst. ECHOcardiogram-12/30/2017-EF 28-41%, grade 1 diastolic dysfunction, restricted mobility aortic valve, mild mitral regurgitation.  No pulmonary hypertension. Lab 12/26/2017-BUN 38, creatinine 1.90, BNP-242 H, D-dimer 0.92 H -----------------------------------------------------------------------------------------------   10/05/2018- Virtual Visit via Telephone Note  History of Present Illness: 80 year old female former smoker followed for allergic rhinitis, asthma/COPD , history urticaria, complicated by kidney cancer, DM2, GERD, history CVA, HBP, Diastolic Dysfunction Gr1 Covid test neg 7/24 Albuterol hfa,  Complains of a "grinding" respiratory noise when she lies down. Non-productive cough. DOE with brisk or sustained walking but no wheeze.Frequent nasal congestion. Pains L lateral chest- not exertional. Advair no help. Vicks VapoRup on lip at bedtime makes her comfortable to sleep. Admits her weight affects her breathing.  Pending cardiac stress test.  Discussed CT chest from July,  new 4 mm nodule.   Observations/Objective: CT chest 08/10/2018- IMPRESSION: 1. No cause for the patient's symptoms identified. 2. There is a new 4 mm nodule in the left lung on series 3, image 70. Several other stable nodules are noted. No follow-up needed if patient is low-risk. Non-contrast chest CT can be considered in 12 months if patient is high-risk. This recommendation follows the consensus statement: Guidelines for Management of Incidental Pulmonary Nodules Detected on CT Images: From the Fleischner Society 2017; Radiology 2017; 284:228-243. 3. Cardiomegaly. 4. Coronary artery calcifications. Atherosclerotic changes in the aorta. 5. Emphysematous changes in the lungs. 6. Colonic diverticulosis without diverticulitis. Aortic Atherosclerosis (ICD10-I70.0) and Emphysema (ICD10-J43.9).  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 Ok to use rescue inhaler regularly 2 x daily for trial. Get flu vax. CAD- pending stress test per cardiology   Follow Up Instructions:    04/24/20- 80 year old female former smoker (30 pkyrs) followed for Alergic Rhinitis, Asthma/COPD, Lung Nodule , history urticaria, complicated by Hx Kidney Cancer, DM2, Hyperlipidemia, GERD, history CVA, HTN, Diastolic Dysfunction Gr1, CKD 4,  -Albuterol hfa, Anoro,  Covid vax-3 Moderna                    Husband here Flu vax- had -----Sob improved, productive cough with white mucus, wheezing.  No acute event. Using rescue hfa less than 1x/ week. Anoro helps Wanted to talk about BP- has her home BP diary with occ low diastolic by wrist cuff. Discussed need to stand carefully and advised her to bring this up with her PCP, cardiology or nephrology. Discussed CT and CCXR- small nodules.  CT chest 02/22/19- IMPRESSION: 1. Unchanged small lung nodules. No new abnormality identified in the chest. 2. Aortic Atherosclerosis (ICD10-I70.0) and Emphysema  (ICD10-J43.9).  ROS- see HPI   + = positive Constitutional:  + weight loss, night sweats, fevers, chills,  fatigue, lassitude. HEENT:   No-  headaches, difficulty swallowing, tooth/dental problems, sore throat,       No-  sneezing, itching, ear ache, +nasal congestion, post nasal drip,  CV:  noncardiac chest pain, no-orthopnea, PND, swelling in lower extremities, anasarca,               dizziness, No-palpitations Resp: + shortness of breath with exertion or at rest.               + productive cough,  No non-productive cough,  No-  coughing up of blood.               change in color of mucus.  No- wheezing.   Skin: No-   rash or lesions. GI:  No-   heartburn, indigestion, abdominal pain, nausea, vomiting,  GU: MS:  +  joint pain or swelling.  . Neuro- nothing unusual Psych:  No- change in mood or affect. No depression or anxiety.  No memory loss.  OBJ General- Alert, Oriented, Affect-appropriate, Distress- none acute, +Obese, talkative ..  Skin- rash-none, lesions- none, excoriation- none Lymphadenopathy- none Head- atraumatic            Eyes- Gross vision intact, PERRLA, conjunctivae clear secretions            Ears- hearing intact            Nose- +turbinate edema, no-Septal dev, mucus bridging, no-polyps, erosion, perforation             Throat- Mallampati III , mucosa clear , drainage- none, tonsils- atrophic, Neck- flexible , trachea midline, no stridor , thyroid nl, carotid no bruit Chest - symmetrical excursion , unlabored           Heart/CV- RRR , no murmur , no gallop  , no rub, nl s1 s2                           - JVD- none ,  Edema- none, stasis changes- none, varices- none           Lung- clear to P&A, wheeze-none, cough- none , dullness-none, rub- none           Chest wall-  Abd-  Br/ Gen/ Rectal- Not done, not indicated Extrem- cyanosis- none, clubbing, none, atrophy- none, strength- nl. these Neuro- grossly intact to observation

## 2020-04-24 ENCOUNTER — Ambulatory Visit (INDEPENDENT_AMBULATORY_CARE_PROVIDER_SITE_OTHER): Payer: Medicare HMO

## 2020-04-24 ENCOUNTER — Encounter: Payer: Self-pay | Admitting: Internal Medicine

## 2020-04-24 ENCOUNTER — Other Ambulatory Visit: Payer: Self-pay

## 2020-04-24 ENCOUNTER — Ambulatory Visit (INDEPENDENT_AMBULATORY_CARE_PROVIDER_SITE_OTHER): Payer: Medicare HMO | Admitting: Internal Medicine

## 2020-04-24 VITALS — BP 106/60 | HR 72 | Temp 97.7°F | Ht 63.0 in | Wt 211.0 lb

## 2020-04-24 DIAGNOSIS — J449 Chronic obstructive pulmonary disease, unspecified: Secondary | ICD-10-CM | POA: Diagnosis not present

## 2020-04-24 DIAGNOSIS — R911 Solitary pulmonary nodule: Secondary | ICD-10-CM | POA: Diagnosis not present

## 2020-04-24 DIAGNOSIS — IMO0001 Reserved for inherently not codable concepts without codable children: Secondary | ICD-10-CM

## 2020-04-24 MED ORDER — TRELEGY ELLIPTA 100-62.5-25 MCG/INH IN AEPB: 1.0000 | INHALATION_SPRAY | Freq: Every day | RESPIRATORY_TRACT | 0 refills | Status: DC

## 2020-04-24 NOTE — Patient Instructions (Signed)
Order- CXR    Dx Asthma/ COPD overlap  Order- Sample x 2 Trelegy 100     Inhale 1 puff, then rinse mouth,  Once daily            Try this instead of Anoro. If no better, go back to Anoro. If you like Trelegy better, let us know so we can send a prescription.  Please call if we can help

## 2020-04-24 NOTE — Assessment & Plan Note (Signed)
Low risk, but with hx renal Ca, will image. Plan- CXR

## 2020-04-24 NOTE — Assessment & Plan Note (Signed)
Seems stable clincally but will let her try Trelegy for comparison. Plan- samples Telegy 100.

## 2020-04-28 ENCOUNTER — Encounter: Payer: Self-pay | Admitting: *Deleted

## 2020-05-09 ENCOUNTER — Other Ambulatory Visit: Payer: Self-pay | Admitting: Neurology

## 2020-05-09 ENCOUNTER — Other Ambulatory Visit (HOSPITAL_COMMUNITY): Payer: Self-pay | Admitting: Neurology

## 2020-05-09 DIAGNOSIS — G3184 Mild cognitive impairment, so stated: Secondary | ICD-10-CM

## 2020-05-17 ENCOUNTER — Ambulatory Visit: Payer: Medicare HMO

## 2020-05-21 ENCOUNTER — Ambulatory Visit
Admission: RE | Admit: 2020-05-21 | Discharge: 2020-05-21 | Disposition: A | Discharge status: Home / Self Care | Payer: Medicare HMO | Source: Ambulatory Visit | Attending: Neurology | Admitting: Neurology

## 2020-05-21 ENCOUNTER — Other Ambulatory Visit: Payer: Self-pay

## 2020-05-21 DIAGNOSIS — G3184 Mild cognitive impairment, so stated: Secondary | ICD-10-CM | POA: Insufficient documentation

## 2020-06-06 ENCOUNTER — Emergency Department: Payer: Medicare HMO

## 2020-06-06 ENCOUNTER — Emergency Department
Admission: EM | Admit: 2020-06-06 | Discharge: 2020-06-07 | Disposition: A | Discharge status: Home / Self Care | Payer: Medicare HMO | Attending: Emergency Medicine | Admitting: Emergency Medicine

## 2020-06-06 ENCOUNTER — Other Ambulatory Visit: Payer: Self-pay

## 2020-06-06 ENCOUNTER — Encounter: Payer: Self-pay | Admitting: Emergency Medicine

## 2020-06-06 DIAGNOSIS — I6782 Cerebral ischemia: Secondary | ICD-10-CM | POA: Insufficient documentation

## 2020-06-06 DIAGNOSIS — M545 Low back pain, unspecified: Secondary | ICD-10-CM | POA: Diagnosis not present

## 2020-06-06 DIAGNOSIS — Z85528 Personal history of other malignant neoplasm of kidney: Secondary | ICD-10-CM | POA: Insufficient documentation

## 2020-06-06 DIAGNOSIS — J45909 Unspecified asthma, uncomplicated: Secondary | ICD-10-CM | POA: Diagnosis not present

## 2020-06-06 DIAGNOSIS — R109 Unspecified abdominal pain: Secondary | ICD-10-CM | POA: Insufficient documentation

## 2020-06-06 DIAGNOSIS — E1140 Type 2 diabetes mellitus with diabetic neuropathy, unspecified: Secondary | ICD-10-CM | POA: Diagnosis not present

## 2020-06-06 DIAGNOSIS — J449 Chronic obstructive pulmonary disease, unspecified: Secondary | ICD-10-CM | POA: Diagnosis not present

## 2020-06-06 DIAGNOSIS — Z7952 Long term (current) use of systemic steroids: Secondary | ICD-10-CM | POA: Diagnosis not present

## 2020-06-06 DIAGNOSIS — I2510 Atherosclerotic heart disease of native coronary artery without angina pectoris: Secondary | ICD-10-CM | POA: Insufficient documentation

## 2020-06-06 DIAGNOSIS — Z87891 Personal history of nicotine dependence: Secondary | ICD-10-CM | POA: Insufficient documentation

## 2020-06-06 DIAGNOSIS — Z794 Long term (current) use of insulin: Secondary | ICD-10-CM | POA: Insufficient documentation

## 2020-06-06 DIAGNOSIS — R531 Weakness: Secondary | ICD-10-CM | POA: Diagnosis not present

## 2020-06-06 DIAGNOSIS — E1122 Type 2 diabetes mellitus with diabetic chronic kidney disease: Secondary | ICD-10-CM | POA: Insufficient documentation

## 2020-06-06 DIAGNOSIS — Z79899 Other long term (current) drug therapy: Secondary | ICD-10-CM | POA: Diagnosis not present

## 2020-06-06 DIAGNOSIS — M6281 Muscle weakness (generalized): Secondary | ICD-10-CM | POA: Diagnosis not present

## 2020-06-06 DIAGNOSIS — N184 Chronic kidney disease, stage 4 (severe): Secondary | ICD-10-CM | POA: Insufficient documentation

## 2020-06-06 DIAGNOSIS — I129 Hypertensive chronic kidney disease with stage 1 through stage 4 chronic kidney disease, or unspecified chronic kidney disease: Secondary | ICD-10-CM | POA: Diagnosis not present

## 2020-06-06 DIAGNOSIS — M79604 Pain in right leg: Secondary | ICD-10-CM

## 2020-06-06 LAB — COMPREHENSIVE METABOLIC PANEL
ALT: 20 U/L (ref 0–44)
AST: 20 U/L (ref 15–41)
Albumin: 3.6 g/dL (ref 3.5–5.0)
Alkaline Phosphatase: 64 U/L (ref 38–126)
Anion gap: 14 (ref 5–15)
BUN: 48 mg/dL — ABNORMAL HIGH (ref 8–23)
CO2: 29 mmol/L (ref 22–32)
Calcium: 9.3 mg/dL (ref 8.9–10.3)
Chloride: 95 mmol/L — ABNORMAL LOW (ref 98–111)
Creatinine, Ser: 8.62 mg/dL — ABNORMAL HIGH (ref 0.44–1.00)
GFR, Estimated: 4 mL/min — ABNORMAL LOW (ref >60)
Glucose, Bld: 146 mg/dL — ABNORMAL HIGH (ref 70–99)
Potassium: 3.4 mmol/L — ABNORMAL LOW (ref 3.5–5.1)
Sodium: 138 mmol/L (ref 135–145)
Total Bilirubin: 0.7 mg/dL (ref 0.3–1.2)
Total Protein: 7.5 g/dL (ref 6.5–8.1)

## 2020-06-06 LAB — DIFFERENTIAL
Abs Immature Granulocytes: 0.04 10*3/uL (ref 0.00–0.07)
Basophils Absolute: 0 10*3/uL (ref 0.0–0.1)
Basophils Relative: 0 %
Eosinophils Absolute: 0 10*3/uL (ref 0.0–0.5)
Eosinophils Relative: 0 %
Immature Granulocytes: 1 %
Lymphocytes Relative: 29 %
Lymphs Abs: 2.3 10*3/uL (ref 0.7–4.0)
Monocytes Absolute: 0.7 10*3/uL (ref 0.1–1.0)
Monocytes Relative: 9 %
Neutro Abs: 5.1 10*3/uL (ref 1.7–7.7)
Neutrophils Relative %: 61 %

## 2020-06-06 LAB — APTT: aPTT: 28 seconds (ref 24–36)

## 2020-06-06 LAB — CBC
HCT: 30 % — ABNORMAL LOW (ref 36.0–46.0)
Hemoglobin: 9.9 g/dL — ABNORMAL LOW (ref 12.0–15.0)
MCH: 30.9 pg (ref 26.0–34.0)
MCHC: 33 g/dL (ref 30.0–36.0)
MCV: 93.8 fL (ref 80.0–100.0)
Platelets: 212 10*3/uL (ref 150–400)
RBC: 3.2 MIL/uL — ABNORMAL LOW (ref 3.87–5.11)
RDW: 14.7 % (ref 11.5–15.5)
WBC: 8.2 10*3/uL (ref 4.0–10.5)
nRBC: 0 % (ref 0.0–0.2)

## 2020-06-06 LAB — PROTIME-INR
INR: 1.1 (ref 0.8–1.2)
Prothrombin Time: 13.9 seconds (ref 11.4–15.2)

## 2020-06-06 LAB — CBG MONITORING, ED: Glucose-Capillary: 94 mg/dL (ref 70–99)

## 2020-06-06 MED ORDER — ACETAMINOPHEN 500 MG PO TABS
1000.0000 mg | ORAL_TABLET | Freq: Once | ORAL | Status: AC
  Administered 2020-06-06: 1000 mg via ORAL
  Filled 2020-06-06: qty 2

## 2020-06-06 MED ORDER — LIDOCAINE 5 % EX PTCH
1.0000 | MEDICATED_PATCH | CUTANEOUS | Status: DC
  Administered 2020-06-06: 1 via TRANSDERMAL
  Filled 2020-06-06: qty 1

## 2020-06-06 NOTE — ED Provider Notes (Addendum)
[facility] Emergency Department Provider Note  ____________________________________________   Event Date/Time   First MD Initiated Contact with Patient 06/06/20 1945     (approximate)  I have reviewed the triage vital signs and the nursing notes.   HISTORY  Chief Complaint Weakness    HPI Megan [name] is a 80 y.o. female with peritoneal dialysis who comes in for right leg weakness.  Patient reports for the past 2 days she has had weakness in her right leg where she states that she just could not lift it up.  Denied her initially being any pain but then later stated that she developed some pain in her right groin area.  She also reports some decrease sensation on the right side of her body but states that she is had a prior stroke and that seems to be more at baseline.  Patient's pain is worse with trying to lift the leg up, intermittent, has not take anything to help with pain.  Patient was seen by her primary care doctor today and they sent her to the ER to be evaluated for a stroke.  Patient also reports lower back pain.  Denies any urinating on herself or rectal numbness.  Just of the right leg is a lot weaker  Patient does report a fall a few days ago but does not think she hit her hip.          Past Medical History:  Diagnosis Date  . Adenomatous colon polyp   . Allergic rhinitis   . Asthma   . Cancer ([place])    kidney  . COPD (chronic obstructive pulmonary disease) ([place])   . Diabetes mellitus type II   . Diverticulosis of colon   . Esophageal stricture   . Fatty liver   . Gastritis   . GERD (gastroesophageal reflux disease)   . Gout   . History of chickenpox   . History of CVA (cerebrovascular accident) 2004  . Hyperlipidemia   . Hypertension   . Internal hemorrhoids   . Kidney tumor 11/2012  . Loss of smell 2004   due to CVA  . Periumbilical hernia   . Renal insufficiency   . Umbilical hernia     Patient Active Problem List    Diagnosis Date Noted  . Epigastric pain   . Hyperkalemia 01/28/2019  . Bradycardia   . CKD stage 4 due to type 2 diabetes mellitus ([place])   . Lung nodule < 6cm on CT 10/07/2018  . Dyspnea on exertion 01/16/2018  . Obesity (BMI 30-39.9) 07/04/2017  . Stroke ([place]) 07/04/2017  . Trochanteric bursitis of right hip 02/01/2017  . Type 2 diabetes mellitus with diabetic nephropathy, with long-term current use of insulin ([place]) 07/15/2015  . Otitis, externa, infective 03/26/2014  . Hamstring injury 05/10/2013  . Acute kidney injury superimposed on CKD ([place]) 02/10/2013  . Essential hypertension 02/10/2013  . Postoperative anemia due to acute blood loss 02/09/2013  . 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 ([place]) 02/08/2013  . Renal mass, left 12/22/2012  . Myalgia and myositis 06/16/2012  . Unspecified vitamin D deficiency 06/16/2012  . Fatty liver disease, nonalcoholic 27/51/7001  . Family hx of colon cancer 06/14/2012  . Hx of adenomatous colonic polyps 06/14/2012  . Neck pain on right side 05/03/2012  . Coronary atherosclerosis of native coronary artery 03/17/2012  . Edema 03/17/2012  . Hx of surgical procedure 12/19/2011  . Hyperlipidemia due to type 2 diabetes mellitus ([place]) 12/14/2011  .  Other screening mammogram 11/05/2010  . 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 Diabetes mellitus type II, uncontrolled ([place]) 07/31/2010  . 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  . UTI 12/24/2009  . HYPERLIPIDEMIA 10/30/2009  . Chronic renal insufficiency, stage III (moderate) ([place]) 10/30/2009  . GERD 08/20/2008  . ESOPHAGEAL STRICTURE 03/29/2008  . Seasonal and perennial allergic rhinitis 01/18/2007  . GOUT 08/06/2006  . Hypertension, renal disease 08/06/2006  . Asthma-COPD overlap syndrome ([place]) 08/06/2006  . CEREBROVASCULAR ACCIDENT, HX OF 08/06/2006    Past Surgical History:  Procedure Laterality Date  . ABDOMINAL HYSTERECTOMY    . ABDOMINAL HYSTERECTOMY    . APPENDECTOMY     . CAPD INSERTION N/A 01/31/2019   Procedure: LAPAROSCOPIC INSERTION CONTINUOUS AMBULATORY PERITONEAL DIALYSIS  (CAPD) CATHETER;  Surgeon: Algernon Huxley, MD;  Location: [facility] ORS;  Service: General;  Laterality: N/A;  . CESAREAN SECTION     x 2  . COLONOSCOPY WITH PROPOFOL N/A 07/23/2015   Procedure: COLONOSCOPY WITH PROPOFOL;  Surgeon: Manya Silvas, MD;  Location: [facility] ENDOSCOPY;  Service: Endoscopy;  Laterality: N/A;  . ESOPHAGOGASTRODUODENOSCOPY (EGD) WITH PROPOFOL N/A 07/23/2015   Procedure: ESOPHAGOGASTRODUODENOSCOPY (EGD) WITH PROPOFOL;  Surgeon: Manya Silvas, MD;  Location: [facility] ENDOSCOPY;  Service: Endoscopy;  Laterality: N/A;  . HEMORROIDECTOMY    . KIDNEY SURGERY      Prior to Admission medications   Medication Sig Start Date End Date Taking? Authorizing Provider  acetaminophen (TYLENOL) 325 MG tablet Take 2 tablets (650 mg total) by mouth every 6 (six) hours as needed for mild pain (or Fever >/= 101). 02/01/19   Thornell Mule, MD  albuterol (PROVENTIL HFA;VENTOLIN HFA) 108 (90 Base) MCG/ACT inhaler 2 puffs every 6 hours as needed for wheeze/ cough 05/28/15   Young, Tarri Fuller D, MD  allopurinol (ZYLOPRIM) 100 MG tablet Take 100 mg by mouth daily. 06/26/18   [provider]  amLODipine (NORVASC) 10 MG tablet Take 10 mg by mouth daily.  Patient not taking: Reported on 04/24/2020 06/03/17   [provider]  Celedonio Miyamoto 62.5-25 MCG/INH AEPB Inhale 1 puff by mouth once daily 09/04/19   Baird Lyons D, MD  atorvastatin (LIPITOR) 40 MG tablet Take 40 mg by mouth daily.    [provider]  bisoprolol (ZEBETA) 10 MG tablet Take 1 tablet (10 mg total) by mouth daily. Patient not taking: Reported on 04/24/2020 02/02/19   Thornell Mule, MD  calcitRIOL (ROCALTROL) 0.25 MCG capsule TAKE 1 CAPSULE BY MOUTH THREE TIMES A WEEK 06/29/17   [provider]  ergocalciferol (VITAMIN D2) 50000 UNITS capsule Take 50,000 Units by mouth every 30 (thirty) days.    [provider]  Fluticasone-Umeclidin-Vilant (TRELEGY ELLIPTA) 100-62.5-25 MCG/INH AEPB Inhale 1 puff into the lungs daily. 04/24/20   Baird Lyons D, MD  hyoscyamine (LEVSIN SL) 0.125 MG SL tablet Place 0.125 mg under the tongue every 6 (six) hours as needed.  06/24/17 01/28/19  [provider]  insulin aspart (NOVOLOG) 100 UNIT/ML injection Inject 6 Units into the skin daily before supper. P[place] Patient taking differently: Inject 6 Units into the skin 3 (three) times daily with meals. Sliding scale 12/05/15   Philemon Kingdom, MD  insulin degludec (TRESIBA) 200 UNIT/ML FlexTouch Pen Inject 28 Units into the skin daily.  05/05/17   [provider]  lisinopril (ZESTRIL) 20 MG tablet Take by mouth. 04/11/18   [provider]  metoprolol tartrate (LOPRESSOR) 25 MG tablet Take by mouth. 03/10/19   [provider]  mupirocin cream (BACTROBAN) 2 %  11/14/19   [provider]  ondansetron (ZOFRAN) 4 MG tablet Take 1 tablet (4 mg total) by mouth every 6 (six) hours as needed for nausea. 02/01/19   Thornell Mule, MD  pantoprazole (PROTONIX) 40 MG tablet Take 40 mg by mouth daily.    [provider]  PLAVIX 75 MG tablet TAKE 1 TABLET BY MOUTH ONCE A DAY 07/28/12   Janith Lima, MD  pregabalin (LYRICA) 50 MG capsule Take 1 capsule (50 mg total) by mouth as needed. Patient taking differently: Take 50 mg by mouth daily. 10/24/12   Janith Lima, MD  psyllium (HYDROCIL/METAMUCIL) 95 % PACK Take 1 packet by mouth daily. Patient not taking: Reported on 04/24/2020 02/01/19   Thornell Mule, MD  sevelamer carbonate (RENVELA) 800 MG tablet Take 1,600 mg by mouth 2 (two) times daily. 04/14/20   [provider]  sodium zirconium cyclosilicate (LOKELMA) 10 g PACK packet Take 10 g by mouth 2 (two) times daily. Patient not taking: Reported on 04/24/2020 02/01/19   Thornell Mule, MD  torsemide [facility]) 100 MG tablet  08/17/19   [provider]  zolpidem  (AMBIEN) 5 MG tablet TAKE 1 TABLET BY MOUTH AT BEDTIME AS NEEDED 07/20/12   Janith Lima, MD    Allergies Crestor [rosuvastatin], Iodinated diagnostic agents, Pioglitazone, Sulfa antibiotics, Sulfonamide derivatives, Losartan, and Sulfasalazine  Family History  Problem Relation Age of Onset  . GER disease Father   . Heart failure Father   . Colon polyps Father   . Ovarian cancer Mother   . Colon cancer Other        aunt  . Diabetes Other   . Colon polyps Other        aunt  . CAD Neg Hx     Social History Social History   Tobacco Use  . Smoking status: Former Smoker    Packs/day: 1.00    Years: 30.00    Pack years: 30.00    Types: Cigarettes    Quit date: 01/18/2002    Years since quitting: 18.3  . Smokeless tobacco: Never Used  Vaping Use  . Vaping Use: Never used  Substance Use Topics  . Alcohol use: No  . Drug use: No      Review of Systems Constitutional: No fever/chills Eyes: No visual changes. ENT: No sore throat. Cardiovascular: Denies chest pain. Respiratory: Denies shortness of breath. Gastrointestinal: No abdominal pain.  No nausea, no vomiting.  No diarrhea.  No constipation. Genitourinary: Negative for dysuria. Musculoskeletal: Right leg pain and weakness, back pain Skin: Negative for rash. Neurological: Right-sided weakness most notable in the right leg All other ROS negative ____________________________________________   PHYSICAL EXAM:  VITAL SIGNS: ED Triage Vitals  Enc Vitals Group     BP 06/06/20 1624 131/60     Pulse Rate 06/06/20 1624 67     Resp 06/06/20 1624 20     Temp 06/06/20 1624 97.8 F (36.6 C)     Temp Source 06/06/20 1624 Oral     SpO2 06/06/20 1624 100 %     Weight 06/06/20 1625 210 lb (95.3 kg)     Height 06/06/20 1625 5\' 3"  (1.6 m)     Head Circumference --      Peak Flow --      Pain Score 06/06/20 1624 0     Pain Loc --      Pain Edu? --      Excl. in [place]? --  Constitutional: Alert and oriented. Well  appearing and in no acute distress. Eyes: Conjunctivae are normal. EOMI. Head: Atraumatic. Nose: No congestion/rhinnorhea. Mouth/Throat: Mucous membranes are moist.   Neck: No stridor. Trachea Midline. FROM Cardiovascular: Normal rate, regular rhythm. Grossly normal heart sounds.  Good peripheral circulation. Respiratory: Normal respiratory effort.  No retractions. Lungs CTAB. Gastrointestinal: Soft and nontender. No distention. No abdominal bruits.  Peritoneal dialysis catheter on the left Musculoskeletal: No lower extremity tenderness nor edema.  No joint effusions.  Pain noted in the left inner groin without any obvious bruising.  There is no redness or warmth of the inner thigh. Neurologic:  Normal speech and language.  Weakness in the right leg and sensation changes in the right leg.  Patient is able to lift the right leg up off the bed but not as high as the left leg.  When I lift up the right leg passively patient reports pain in her inner thigh.   Skin:  Skin is warm, dry and intact. No rash noted. Psychiatric: Mood and affect are normal. Speech and behavior are normal. GU: Deferred  Back: Low L-spine tenderness ____________________________________________   LABS (all labs ordered are listed, but only abnormal results are displayed)  Labs Reviewed  CBC - Abnormal; Notable for the following components:      Result Value   RBC 3.20 (*)    Hemoglobin 9.9 (*)    HCT 30.0 (*)    All other components within normal limits  COMPREHENSIVE METABOLIC PANEL - Abnormal; Notable for the following components:   Potassium 3.4 (*)    Chloride 95 (*)    Glucose, Bld 146 (*)    BUN 48 (*)    Creatinine, Ser 8.62 (*)    GFR, Estimated 4 (*)    All other components within normal limits  PROTIME-INR  APTT  DIFFERENTIAL  CBG MONITORING, ED   ____________________________________________   ED ECG REPORT I, Vanessa Leedey, the attending physician, personally viewed and interpreted this  ECG.  Normal sinus rate of 73, no ST elevation, no T wave inversions, normal intervals ____________________________________________  RADIOLOGY   Official radiology report(s): CT HEAD WO CONTRAST  Result Date: 06/06/2020 CLINICAL DATA:  Difficulty moving right leg, initial encounter EXAM: CT HEAD WITHOUT CONTRAST TECHNIQUE: Contiguous axial images were obtained from the base of the skull through the vertex without intravenous contrast. COMPARISON:  05/21/2020 MRI FINDINGS: Brain: No evidence of acute infarction, hemorrhage, hydrocephalus, extra-axial collection or mass lesion/mass effect. Small lacunar infarct is noted adjacent to the left lateral ventricle in the deep white matter similar to that seen on prior examination. Vascular: No hyperdense vessel or unexpected calcification. Skull: Normal. Negative for fracture or focal lesion. Sinuses/Orbits: No acute finding. Other: None. IMPRESSION: Chronic ischemic change in the left corona radiata stable from the prior exam. No acute abnormality noted. Electronically Signed   By: Inez Catalina M.D.   On: 06/06/2020 17:01   US Venous Img Lower Unilateral Right  Result Date: 06/06/2020 CLINICAL DATA:  Right leg pain. EXAM: RIGHT LOWER EXTREMITY VENOUS DOPPLER ULTRASOUND TECHNIQUE: Gray-scale sonography with compression, as well as color and duplex ultrasound, were performed to evaluate the deep venous system(s) from the level of the common femoral vein through the popliteal and proximal calf veins. COMPARISON:  None. FINDINGS: VENOUS Normal compressibility of the RIGHT common femoral, superficial femoral, and popliteal veins, as well as the visualized RIGHT calf veins. Visualized portions of the RIGHT profunda femoral vein and RIGHT great saphenous vein  unremarkable. No filling defects to suggest DVT on grayscale or color Doppler imaging. Doppler waveforms show normal direction of venous flow, normal respiratory plasticity and response to augmentation.  Limited views of the contralateral common femoral vein are unremarkable. OTHER None. Limitations: none IMPRESSION: Negative. Electronically Signed   By: Virgina Norfolk M.D.   On: 06/06/2020 21:39   DG Hip Unilat W or Wo Pelvis 2-3 Views Right  Result Date: 06/06/2020 CLINICAL DATA:  Right leg weakness. EXAM: DG HIP (WITH OR WITHOUT PELVIS) 2-3V RIGHT COMPARISON:  None. FINDINGS: There is no evidence of hip fracture or dislocation. Degenerative changes seen involving both hips in the form of joint space narrowing and acetabular sclerosis. IMPRESSION: Degenerative changes without an acute osseous abnormality. Electronically Signed   By: Virgina Norfolk M.D.   On: 06/06/2020 21:02    ____________________________________________   PROCEDURES  Procedure(s) performed (including Critical Care):  Procedures   ____________________________________________   INITIAL IMPRESSION / ASSESSMENT AND PLAN / ED COURSE  TESSIE ORDAZ was evaluated in Emergency Department on 06/06/2020 for the symptoms described in the history of present illness. She was evaluated in the context of the global COVID-19 pandemic, which necessitated consideration that the patient might be at risk for infection with the SARS-CoV-2 virus that causes COVID-19. Institutional protocols and algorithms that pertain to the evaluation of patients at risk for COVID-19 are in a state of rapid change based on information released by regulatory bodies including the CDC and federal and state organizations. These policies and algorithms were followed during the patient's care in the ED.     Patient is an 80 year old who comes in with worsening right-sided weakness but also seems to be like more like pain on the right leg.  Pain came in for stroke rule out.  Patient stated that initially was just weakness but then the pain started afterwards.  Suspect that patient might of pulled a muscle.  But given that patient initially just had the  weakness without pain consider neurological process as well.  CT head ordered from triage without any evidence of intercranial hemorrhage therefore will order MRI to rule out stroke.  Patient also does report some lower back pain so we will get MRI lumbar to rule out any evidence of cord compression, herniated disc.  Will get ultrasound of the leg to make sure no DVT, x-ray to evaluate for fracture.  Patient has 2+ distal pulse therefore unlikely arterial issue.  We will give patient some Tylenol and lidocaine patch   Blood work is reassuring.  DVT ultrasound.  X-ray negative.  Patient handed off pending MRI  MRI is negative except for some small herniated disc.  Discussed with family and will give neurosurgery follow-up but typically managed conservatively with Tylenol.  Unable to do steroids due to being diabetic.  Will provide some lidocaine patches.  We will give neurosurgery follow-up.  Patient reports that she still able to ambulate with the leg therefore unlikely" fracture from a prior fall.  Patient does not really have any pain when she is just laying there.  When I go to lift the leg up she develops pain in her inner thigh.  Therefore spoke with again this is more likely related to a muscle versus sciatica from the herniated disc.  Discussed return precautions including fevers, swelling of the leg or any other other concerns  I discussed the provisional nature of ED diagnosis, the treatment so far, the ongoing plan of care, follow up appointments and return  precautions with the patient and any family or support people present. They expressed understanding and agreed with the plan, discharged home.       ____________________________________________   FINAL CLINICAL IMPRESSION(S) / ED DIAGNOSES   Final diagnoses:  Right sided weakness  Pain of right lower extremity      MEDICATIONS GIVEN DURING THIS VISIT:  Medications  lidocaine (LIDODERM) 5 % 1 patch (1 patch Transdermal  Patch Applied 06/06/20 2305)  acetaminophen (TYLENOL) tablet 1,000 mg (1,000 mg Oral Given 06/06/20 2306)     ED Discharge Orders    None       Note:  This document was prepared using Dragon voice recognition software and may include unintentional dictation errors.   Vanessa Folsom, MD 06/06/20 2328    Vanessa , MD 06/07/20 [phone]

## 2020-06-06 NOTE — ED Triage Notes (Signed)
Pt states sent to ED from Dr. Linton Ham office. Pt states was sent due to possible stroke, pt states hx of a "big stroke" with R sided deficits from the stroke. Pt states feels like she is unable to lift and hold her R leg, also feels like when she goes to grab something she is unable to pick it up. Pt states the R side felt different on assessment at PCP's office. Pt states the last time she remembers being at baseline was 2 weeks ago.    Pt states is a [facility] patient.

## 2020-06-06 NOTE — ED Notes (Signed)
Delay in medication administration due to this RN being in room 2 with a critical patient that I was unable to leave for quite some time.    RN entered room to give medications that were ordered, but she is currently in MRI.  Will give meds once patient arrives back to unit.

## 2020-06-07 DIAGNOSIS — M6281 Muscle weakness (generalized): Secondary | ICD-10-CM | POA: Diagnosis not present

## 2020-06-07 MED ORDER — LIDOCAINE 5 % EX PTCH: 1.0000 | MEDICATED_PATCH | Freq: Two times a day (BID) | CUTANEOUS | 0 refills | Status: AC

## 2020-06-07 NOTE — Discharge Instructions (Addendum)
Your MRIs are as below.  She can follow-up with neurosurgery for your bulging disc but usually these are managed conservatively by taking Tylenol 1 g every 8 hours and the lidocaine patches.  Return to the ER if develop fevers, worsening pain, inability to ambulate or any other concerns   IMPRESSION:  1. 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  2. Severe left L5-S1 neural foraminal stenosis.  3. Grade 1 L4-5 anterolisthesis due to facet arthrosis.     IMPRESSION: 1. No acute intracranial abnormality. 2. 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

## 2020-09-09 IMAGING — CT CT CHEST W/O CM
2 of 4 series · 15 of 36 positions shown, 18 images · non-contrast
Comparison: 08/10/2018

CLINICAL DATA: Follow-up lung nodule.

EXAM:
CT CHEST WITHOUT CONTRAST
TECHNIQUE: Multidetector CT imaging of the chest was performed following the
standard protocol without IV contrast.

[Series 2: chest 2.00 · axial · 0.65mm/px · z∈[-1087,-845]mm · 12 of 144 slices shown, 15 images]
[im 12/144  mediastinal]
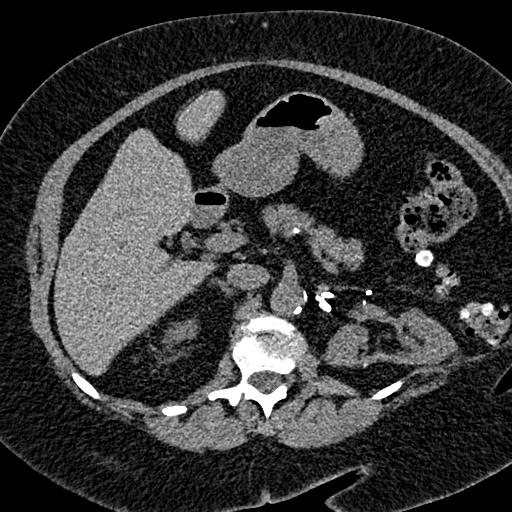
[im 12/144  lung]
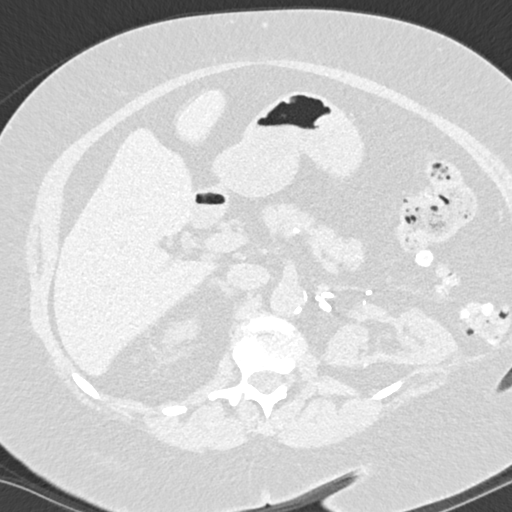
[im 23/144  lung]
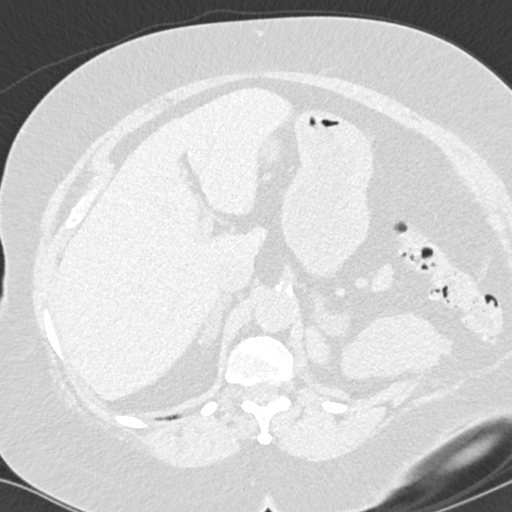
[im 34/144  lung]
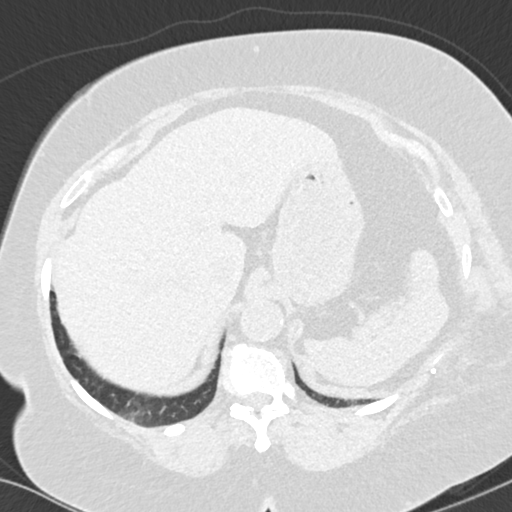
[im 45/144  lung]
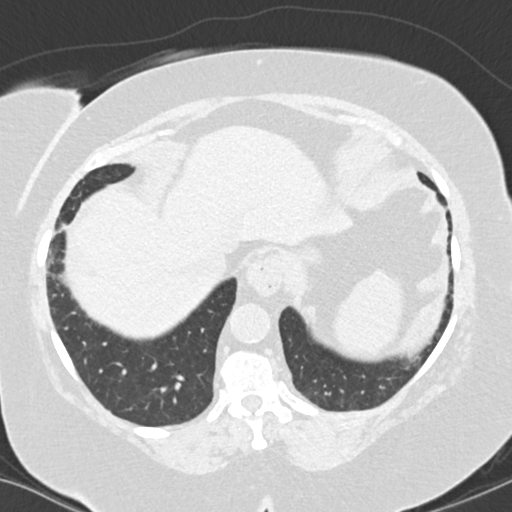
[im 56/144  mediastinal]
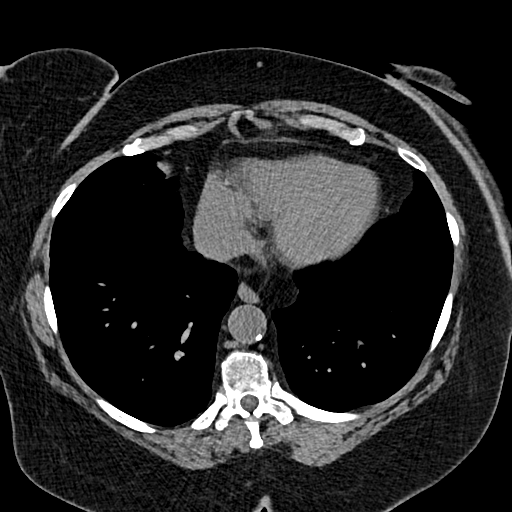
[im 56/144  lung]
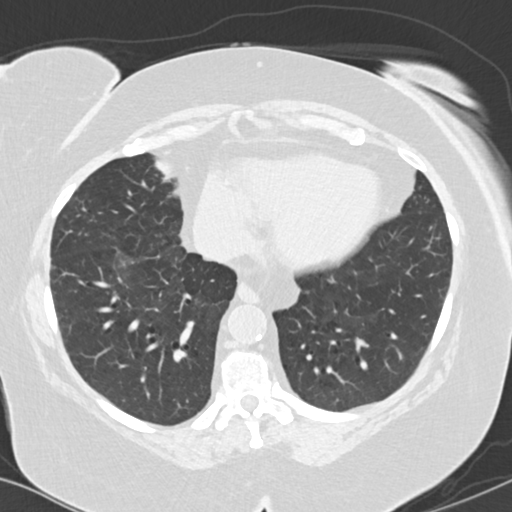
[im 67/144  lung]
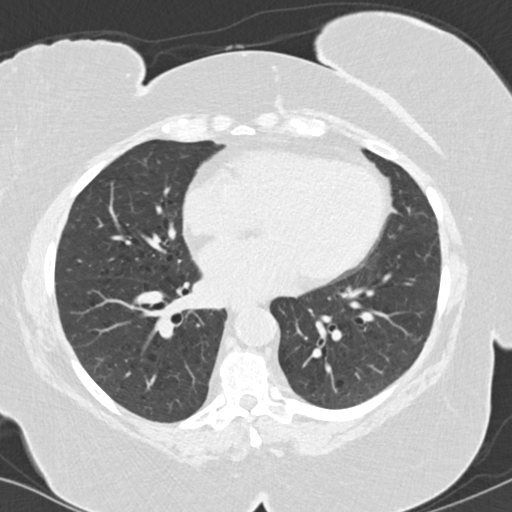
[im 78/144  lung]
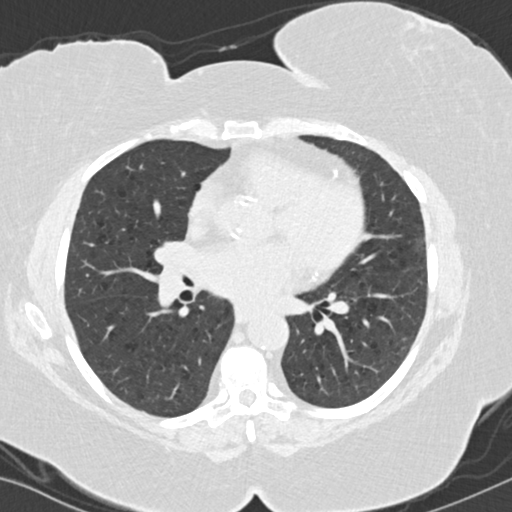
[im 89/144  lung]
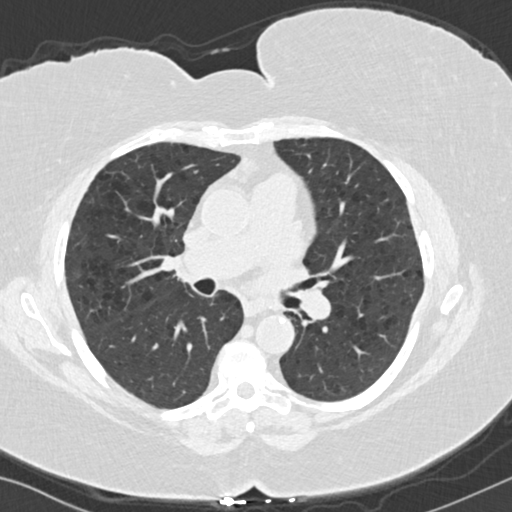
[im 100/144  mediastinal]
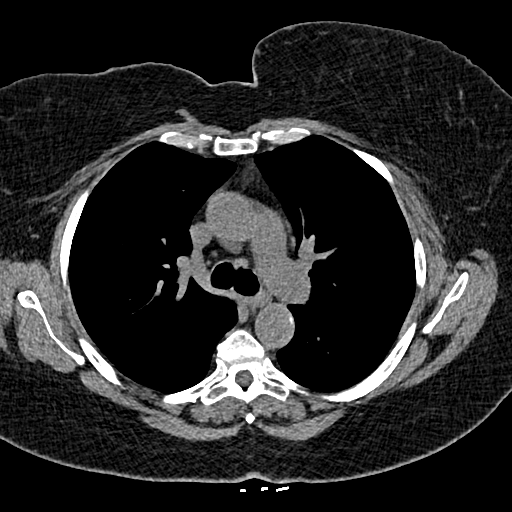
[im 100/144  lung]
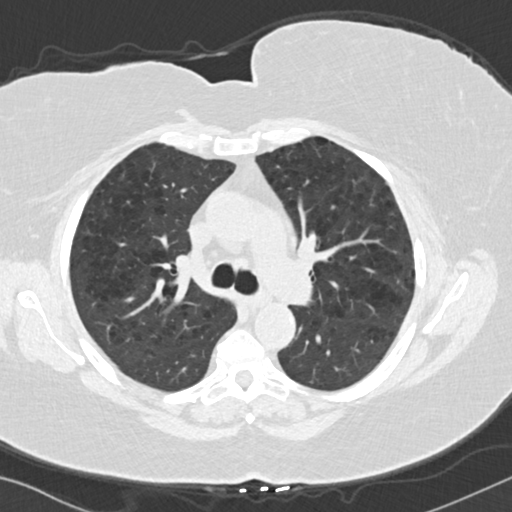
[im 111/144  lung]
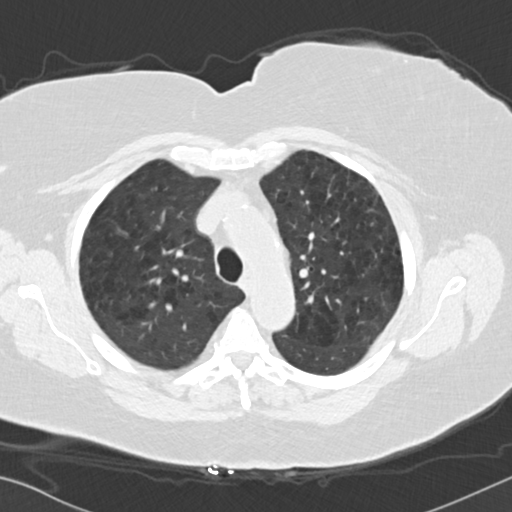
[im 122/144  lung]
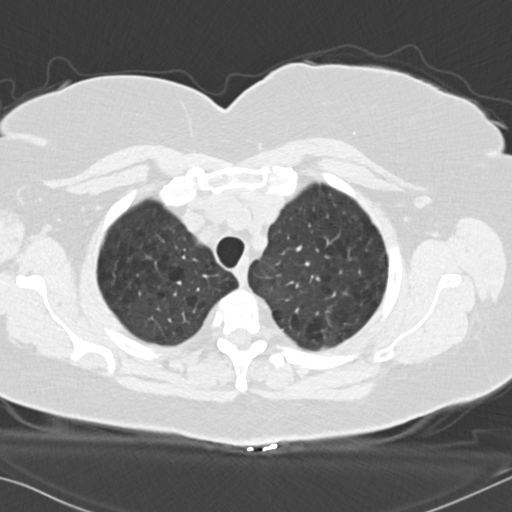
[im 133/144  lung]
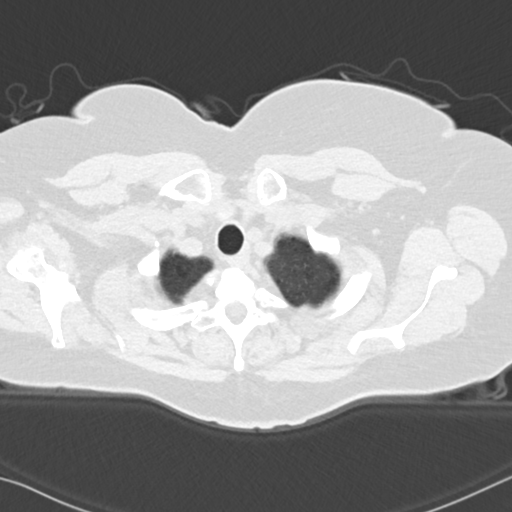

[Series 5: coronals chest 2.00 cor · coronal · 0.56mm/px · 3 of 134 slices shown]
[im 27/134  lung]
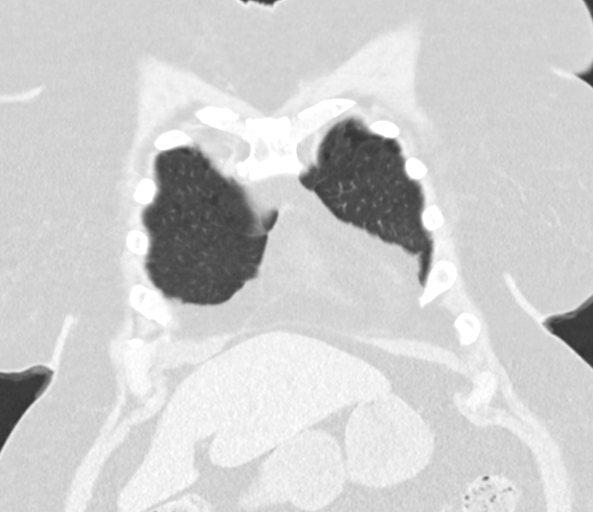
[im 54/134  lung]
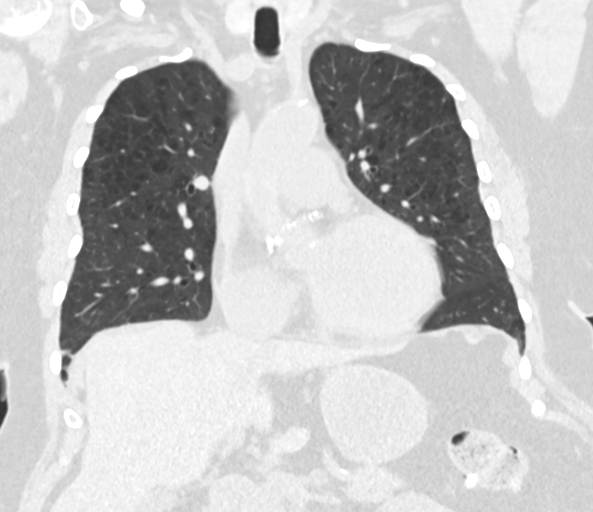
[im 80/134  lung]
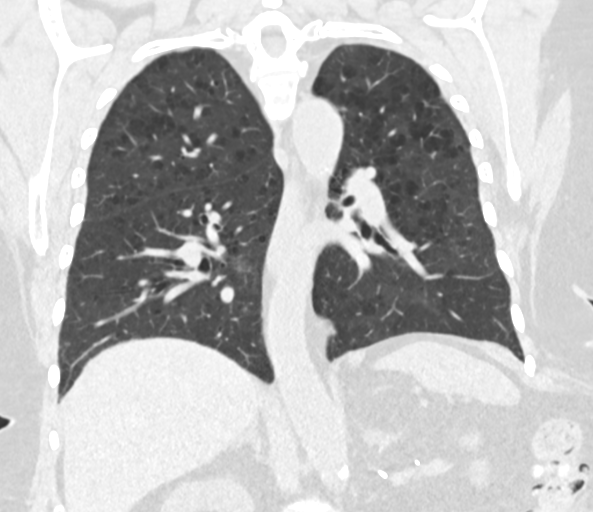

[15 of 36 positions shown; findings below may reference images not displayed]

FINDINGS: Cardiovascular: Thoracic aortic atherosclerosis without aneurysm.
Three-vessel coronary artery atherosclerosis. Normal heart size. No
pericardial effusion.

Mediastinum/Nodes: No enlarged axillary, mediastinal, or hilar lymph
nodes are identified within limitations of noncontrast technique.
Small sliding hiatal hernia. Unremarkable included thyroid.

Lungs/Pleura: No pleural effusion or pneumothorax. Moderate
centrilobular emphysema. A 2 mm subpleural nodule inferolaterally in
the right upper lobe (series 3, image 61), a 4 mm left lower lobe
nodule (series 3, image 65), a second 4 mm left lower lobe nodule
(series 3, image 55), and a 2 mm left lower lobe nodule (series 3,
image 83) are all unchanged. No new nodules are identified. There is
minimal atelectasis in the lung bases.

Upper Abdomen: Partial left nephrectomy. Colonic diverticulosis.

Musculoskeletal: No acute osseous abnormality or suspicious osseous
lesion.
IMPRESSION: 1. Unchanged small lung nodules. No new abnormality identified in
the chest.
2. Aortic Atherosclerosis ([id number]) and Emphysema ([id number]).

## 2020-11-10 ENCOUNTER — Other Ambulatory Visit: Payer: Self-pay | Admitting: Nephrology

## 2020-11-10 DIAGNOSIS — N289 Disorder of kidney and ureter, unspecified: Secondary | ICD-10-CM

## 2020-11-10 DIAGNOSIS — N2889 Other specified disorders of kidney and ureter: Secondary | ICD-10-CM

## 2020-11-24 ENCOUNTER — Telehealth: Payer: Self-pay | Admitting: Internal Medicine

## 2020-11-24 ENCOUNTER — Ambulatory Visit: Payer: Medicare HMO

## 2020-11-24 MED ORDER — TRELEGY ELLIPTA 100-62.5-25 MCG/ACT IN AEPB: 1.0000 | INHALATION_SPRAY | Freq: Every day | RESPIRATORY_TRACT | 11 refills | Status: DC

## 2020-11-24 NOTE — Telephone Encounter (Signed)
Spoke with the pt and notified trey sent to preferred pharm  Nothing further needed

## 2020-11-26 ENCOUNTER — Telehealth: Payer: Self-pay | Admitting: Internal Medicine

## 2020-11-26 MED ORDER — TRELEGY ELLIPTA 100-62.5-25 MCG/ACT IN AEPB: 1.0000 | INHALATION_SPRAY | Freq: Every day | RESPIRATORY_TRACT | 11 refills | Status: DC

## 2020-11-26 NOTE — Telephone Encounter (Signed)
I called the patient to let verify the correct pharmacy that she wanted her medication sent to and she wants it to go to [facility].   I called [facility] to cancel the previous prescription. A new one has been sent to [facility] per patient request. Nothing further needed.

## 2020-11-28 ENCOUNTER — Ambulatory Visit
Admission: RE | Admit: 2020-11-28 | Discharge: 2020-11-28 | Disposition: A | Discharge status: Home / Self Care | Payer: Medicare HMO | Source: Ambulatory Visit | Attending: Nephrology | Admitting: Nephrology

## 2020-11-28 ENCOUNTER — Other Ambulatory Visit: Payer: Self-pay

## 2020-11-28 DIAGNOSIS — N2889 Other specified disorders of kidney and ureter: Secondary | ICD-10-CM | POA: Diagnosis not present

## 2020-11-28 DIAGNOSIS — N289 Disorder of kidney and ureter, unspecified: Secondary | ICD-10-CM | POA: Insufficient documentation

## 2020-11-29 ENCOUNTER — Ambulatory Visit: Payer: Medicare HMO

## 2020-12-09 ENCOUNTER — Other Ambulatory Visit: Payer: Self-pay

## 2020-12-09 ENCOUNTER — Emergency Department (HOSPITAL_COMMUNITY): Payer: Medicare HMO

## 2020-12-09 ENCOUNTER — Emergency Department (HOSPITAL_COMMUNITY)
Admission: EM | Admit: 2020-12-09 | Discharge: 2020-12-10 | Disposition: A | Discharge status: Home / Self Care | Payer: Medicare HMO | Attending: Emergency Medicine | Admitting: Emergency Medicine

## 2020-12-09 DIAGNOSIS — N184 Chronic kidney disease, stage 4 (severe): Secondary | ICD-10-CM | POA: Diagnosis not present

## 2020-12-09 DIAGNOSIS — Z87891 Personal history of nicotine dependence: Secondary | ICD-10-CM | POA: Diagnosis not present

## 2020-12-09 DIAGNOSIS — K458 Other specified abdominal hernia without obstruction or gangrene: Secondary | ICD-10-CM

## 2020-12-09 DIAGNOSIS — J449 Chronic obstructive pulmonary disease, unspecified: Secondary | ICD-10-CM | POA: Insufficient documentation

## 2020-12-09 DIAGNOSIS — E1140 Type 2 diabetes mellitus with diabetic neuropathy, unspecified: Secondary | ICD-10-CM | POA: Diagnosis not present

## 2020-12-09 DIAGNOSIS — Z79899 Other long term (current) drug therapy: Secondary | ICD-10-CM | POA: Diagnosis not present

## 2020-12-09 DIAGNOSIS — G8929 Other chronic pain: Secondary | ICD-10-CM | POA: Insufficient documentation

## 2020-12-09 DIAGNOSIS — Z794 Long term (current) use of insulin: Secondary | ICD-10-CM | POA: Diagnosis not present

## 2020-12-09 DIAGNOSIS — I129 Hypertensive chronic kidney disease with stage 1 through stage 4 chronic kidney disease, or unspecified chronic kidney disease: Secondary | ICD-10-CM | POA: Insufficient documentation

## 2020-12-09 DIAGNOSIS — J45909 Unspecified asthma, uncomplicated: Secondary | ICD-10-CM | POA: Insufficient documentation

## 2020-12-09 DIAGNOSIS — M545 Low back pain, unspecified: Secondary | ICD-10-CM | POA: Diagnosis present

## 2020-12-09 DIAGNOSIS — M48062 Spinal stenosis, lumbar region with neurogenic claudication: Secondary | ICD-10-CM | POA: Diagnosis not present

## 2020-12-09 DIAGNOSIS — Z7951 Long term (current) use of inhaled steroids: Secondary | ICD-10-CM | POA: Insufficient documentation

## 2020-12-09 DIAGNOSIS — E1122 Type 2 diabetes mellitus with diabetic chronic kidney disease: Secondary | ICD-10-CM | POA: Insufficient documentation

## 2020-12-09 DIAGNOSIS — Z85528 Personal history of other malignant neoplasm of kidney: Secondary | ICD-10-CM | POA: Diagnosis not present

## 2020-12-09 DIAGNOSIS — I2510 Atherosclerotic heart disease of native coronary artery without angina pectoris: Secondary | ICD-10-CM | POA: Insufficient documentation

## 2020-12-09 DIAGNOSIS — M549 Dorsalgia, unspecified: Secondary | ICD-10-CM

## 2020-12-09 LAB — CBC WITH DIFFERENTIAL/PLATELET
Abs Immature Granulocytes: 0.05 10*3/uL (ref 0.00–0.07)
Basophils Absolute: 0 10*3/uL (ref 0.0–0.1)
Basophils Relative: 0 %
Eosinophils Absolute: 0 10*3/uL (ref 0.0–0.5)
Eosinophils Relative: 0 %
HCT: 32.5 % — ABNORMAL LOW (ref 36.0–46.0)
Hemoglobin: 10.4 g/dL — ABNORMAL LOW (ref 12.0–15.0)
Immature Granulocytes: 1 %
Lymphocytes Relative: 24 %
Lymphs Abs: 2.2 10*3/uL (ref 0.7–4.0)
MCH: 31 pg (ref 26.0–34.0)
MCHC: 32 g/dL (ref 30.0–36.0)
MCV: 96.7 fL (ref 80.0–100.0)
Monocytes Absolute: 0.8 10*3/uL (ref 0.1–1.0)
Monocytes Relative: 8 %
Neutro Abs: 6.1 10*3/uL (ref 1.7–7.7)
Neutrophils Relative %: 67 %
Platelets: 215 10*3/uL (ref 150–400)
RBC: 3.36 MIL/uL — ABNORMAL LOW (ref 3.87–5.11)
RDW: 15.8 % — ABNORMAL HIGH (ref 11.5–15.5)
WBC: 9.2 10*3/uL (ref 4.0–10.5)
nRBC: 0 % (ref 0.0–0.2)

## 2020-12-09 LAB — BODY FLUID CELL COUNT WITH DIFFERENTIAL
Eos, Fluid: 0 %
Lymphs, Fluid: 84 %
Monocyte-Macrophage-Serous Fluid: 1 % — ABNORMAL LOW (ref 50–90)
Neutrophil Count, Fluid: 15 % (ref 0–25)
Total Nucleated Cell Count, Fluid: 81 cu mm (ref 0–1000)

## 2020-12-09 LAB — COMPREHENSIVE METABOLIC PANEL
ALT: 25 U/L (ref 0–44)
AST: 31 U/L (ref 15–41)
Albumin: 3 g/dL — ABNORMAL LOW (ref 3.5–5.0)
Alkaline Phosphatase: 78 U/L (ref 38–126)
Anion gap: 13 (ref 5–15)
BUN: 46 mg/dL — ABNORMAL HIGH (ref 8–23)
CO2: 25 mmol/L (ref 22–32)
Calcium: 9 mg/dL (ref 8.9–10.3)
Chloride: 99 mmol/L (ref 98–111)
Creatinine, Ser: 5.94 mg/dL — ABNORMAL HIGH (ref 0.44–1.00)
GFR, Estimated: 7 mL/min — ABNORMAL LOW (ref >60)
Glucose, Bld: 152 mg/dL — ABNORMAL HIGH (ref 70–99)
Potassium: 4 mmol/L (ref 3.5–5.1)
Sodium: 137 mmol/L (ref 135–145)
Total Bilirubin: 0.6 mg/dL (ref 0.3–1.2)
Total Protein: 6 g/dL — ABNORMAL LOW (ref 6.5–8.1)

## 2020-12-09 LAB — URINALYSIS, ROUTINE W REFLEX MICROSCOPIC
Bilirubin Urine: NEGATIVE
Glucose, UA: NEGATIVE mg/dL
Hgb urine dipstick: NEGATIVE
Ketones, ur: NEGATIVE mg/dL
Leukocytes,Ua: NEGATIVE
Nitrite: NEGATIVE
Protein, ur: 30 mg/dL — AB
Specific Gravity, Urine: 1.012 (ref 1.005–1.030)
pH: 7 (ref 5.0–8.0)

## 2020-12-09 LAB — LACTIC ACID, PLASMA: Lactic Acid, Venous: 1.6 mmol/L (ref 0.5–1.9)

## 2020-12-09 LAB — LIPASE, BLOOD: Lipase: 37 U/L (ref 11–51)

## 2020-12-09 MED ORDER — MORPHINE SULFATE (PF) 4 MG/ML IV SOLN
4.0000 mg | Freq: Once | INTRAVENOUS | Status: AC
  Administered 2020-12-09: 4 mg via INTRAVENOUS
  Filled 2020-12-09: qty 1

## 2020-12-09 MED ORDER — SODIUM CHLORIDE 0.9 % IV SOLN: 1000.0000 mL | Freq: Once | INTRAVENOUS | Status: DC

## 2020-12-09 MED ORDER — FENTANYL CITRATE PF 50 MCG/ML IJ SOSY
25.0000 ug | PREFILLED_SYRINGE | Freq: Once | INTRAMUSCULAR | Status: AC
  Administered 2020-12-09: 25 ug via INTRAVENOUS
  Filled 2020-12-09: qty 1

## 2020-12-09 MED ORDER — LACTATED RINGERS IV BOLUS
1000.0000 mL | Freq: Once | INTRAVENOUS | Status: AC
  Administered 2020-12-09: 1000 mL via INTRAVENOUS

## 2020-12-09 NOTE — ED Provider Notes (Signed)
[facility] EMERGENCY DEPARTMENT Provider Note   CSN: [id number] Arrival date & time: 12/09/20  1532     History No chief complaint on file.   Megan [name] is a 80 y.o. female.  HPI  80 year old female presenting to the emergency department with left flank pain.  She states that she has had left back pain and left flank pain.  She additionally endorses increased frequency associated with urination and some painful urination.  The pain in her left flank is sharp, shooting and radiates around her abdomen.  She has a right kidney and half of her left kidney due to previous mass in her left kidney that was surgically removed.   Takes hydrocodone at home for chronic pain without relief.  She states that she has chronic left flank pain but this pain has acutely worsened.  She recently had MRI imaging of the abdomen which revealed a 12 mm lesion in the lower pole of the left kidney which could be a complex cyst although neoplasm could also have this appearance with close follow-up recommended.  Multiple tiny hyperintensities were noted in both kidneys could reflect cysts, partial nephrectomy defect of the left kidney noted.  The patient also undergoes peritoneal dialysis.  She denies any cloudiness associated with her peritoneal fluid.  She denies any diffuse abdominal pain, nausea or vomiting.  She denies any fevers or chills.  Past Medical History:  Diagnosis Date   Adenomatous colon polyp    Allergic rhinitis    Asthma    Cancer (HCC)    kidney   COPD (chronic obstructive pulmonary disease) ([place])    Diabetes mellitus type II    Diverticulosis of colon    Esophageal stricture    Fatty liver    Gastritis    GERD (gastroesophageal reflux disease)    Gout    History of chickenpox    History of CVA (cerebrovascular accident) 2004   Hyperlipidemia    Hypertension    Internal hemorrhoids    Kidney tumor 11/2012   Loss of smell 2004   due to CVA   Periumbilical  hernia    Renal insufficiency    Umbilical hernia     Patient Active Problem List   Diagnosis Date Noted   Epigastric pain    Hyperkalemia 01/28/2019   Bradycardia    CKD stage 4 due to type 2 diabetes mellitus ([place])    Lung nodule < 6cm on CT 10/07/2018   Dyspnea on exertion 01/16/2018   Obesity (BMI 30-39.9) 07/04/2017   Stroke ([place]) 07/04/2017   Trochanteric bursitis of right hip 02/01/2017   Type 2 diabetes mellitus with diabetic nephropathy, with long-term current use of insulin ([place]) 07/15/2015   Otitis, externa, infective 03/26/2014   Hamstring injury 05/10/2013   Acute kidney injury superimposed on CKD ([place]) 02/10/2013   Essential hypertension 02/10/2013   Postoperative anemia due to acute blood loss 02/09/2013   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 ([place]) 02/08/2013   Renal mass, left 12/22/2012   Myalgia and myositis 06/16/2012   Unspecified vitamin D deficiency 06/16/2012   Fatty liver disease, nonalcoholic 63/14/9702   Family hx of colon cancer 06/14/2012   Hx of adenomatous colonic polyps 06/14/2012   Neck pain on right side 05/03/2012   Coronary atherosclerosis of native coronary artery 03/17/2012   Edema 03/17/2012   Hx of surgical procedure 12/19/2011   Hyperlipidemia due to type 2 diabetes mellitus ([place]) 12/14/2011   Other screening mammogram 11/05/2010  Periodic limb movement sleep disorder 10/29/2010   Diabetes mellitus type II, uncontrolled 07/31/2010   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   UTI 12/24/2009   HYPERLIPIDEMIA 10/30/2009   Chronic renal insufficiency, stage III (moderate) ([place]) 10/30/2009   GERD 08/20/2008   ESOPHAGEAL STRICTURE 03/29/2008   Seasonal and perennial allergic rhinitis 01/18/2007   GOUT 08/06/2006   Hypertension, renal disease 08/06/2006   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   CEREBROVASCULAR ACCIDENT, HX OF 08/06/2006    Past Surgical History:  Procedure Laterality Date   ABDOMINAL HYSTERECTOMY      ABDOMINAL HYSTERECTOMY     APPENDECTOMY     CAPD INSERTION N/A 01/31/2019   Procedure: LAPAROSCOPIC INSERTION CONTINUOUS AMBULATORY PERITONEAL DIALYSIS  (CAPD) CATHETER;  Surgeon: Algernon Huxley, MD;  Location: [facility] ORS;  Service: General;  Laterality: N/A;   CESAREAN SECTION     x 2   COLONOSCOPY WITH PROPOFOL N/A 07/23/2015   Procedure: COLONOSCOPY WITH PROPOFOL;  Surgeon: Manya Silvas, MD;  Location: [facility] ENDOSCOPY;  Service: Endoscopy;  Laterality: N/A;   ESOPHAGOGASTRODUODENOSCOPY (EGD) WITH PROPOFOL N/A 07/23/2015   Procedure: ESOPHAGOGASTRODUODENOSCOPY (EGD) WITH PROPOFOL;  Surgeon: Manya Silvas, MD;  Location: [facility] ENDOSCOPY;  Service: Endoscopy;  Laterality: N/A;   HEMORROIDECTOMY     KIDNEY SURGERY       OB History   No obstetric history on file.     Family History  Problem Relation Age of Onset   GER disease Father    Heart failure Father    Colon polyps Father    Ovarian cancer Mother    Colon cancer Other        aunt   Diabetes Other    Colon polyps Other        aunt   CAD Neg Hx     Social History   Tobacco Use   Smoking status: Former    Packs/day: 1.00    Years: 30.00    Pack years: 30.00    Types: Cigarettes    Quit date: 01/18/2002    Years since quitting: 18.9   Smokeless tobacco: Never  Vaping Use   Vaping Use: Never used  Substance Use Topics   Alcohol use: No   Drug use: No    Home Medications Prior to Admission medications   Medication Sig Start Date End Date Taking? Authorizing Provider  acetaminophen (TYLENOL) 325 MG tablet Take 2 tablets (650 mg total) by mouth every 6 (six) hours as needed for mild pain (or Fever >/= 101). 02/01/19   Thornell Mule, MD  albuterol (PROVENTIL HFA;VENTOLIN HFA) 108 (90 Base) MCG/ACT inhaler 2 puffs every 6 hours as needed for wheeze/ cough 05/28/15   Young, Tarri Fuller D, MD  allopurinol (ZYLOPRIM) 100 MG tablet Take 100 mg by mouth daily. 06/26/18   [provider]  Celedonio Miyamoto 62.5-25 MCG/INH AEPB  Inhale 1 puff by mouth once daily 09/04/19   Baird Lyons D, MD  atorvastatin (LIPITOR) 40 MG tablet Take 40 mg by mouth daily.    [provider]  calcitRIOL (ROCALTROL) 0.25 MCG capsule TAKE 1 CAPSULE BY MOUTH THREE TIMES A WEEK 06/29/17   [provider]  ergocalciferol (VITAMIN D2) 50000 UNITS capsule Take 50,000 Units by mouth every 30 (thirty) days.    [provider]  Fluticasone-Umeclidin-Vilant (TRELEGY ELLIPTA) 100-62.5-25 MCG/ACT AEPB Inhale 1 puff into the lungs daily. 11/26/20   Baird Lyons D, MD  hyoscyamine (LEVSIN SL) 0.125 MG SL tablet Place 0.125 mg under the tongue every  6 (six) hours as needed.  06/24/17 01/28/19  [provider]  insulin aspart (NOVOLOG) 100 UNIT/ML injection Inject 6 Units into the skin daily before supper. Pens Patient taking differently: Inject 6 Units into the skin 3 (three) times daily with meals. Sliding scale 12/05/15   Philemon Kingdom, MD  insulin degludec (TRESIBA) 200 UNIT/ML FlexTouch Pen Inject 28 Units into the skin daily.  05/05/17   [provider]  lisinopril (ZESTRIL) 20 MG tablet Take by mouth. 04/11/18   [provider]  metoprolol tartrate (LOPRESSOR) 25 MG tablet Take by mouth. 03/10/19   [provider]  mupirocin cream (BACTROBAN) 2 %  11/14/19   [provider]  ondansetron (ZOFRAN) 4 MG tablet Take 1 tablet (4 mg total) by mouth every 6 (six) hours as needed for nausea. 02/01/19   Thornell Mule, MD  pantoprazole (PROTONIX) 40 MG tablet Take 40 mg by mouth daily.    [provider]  PLAVIX 75 MG tablet TAKE 1 TABLET BY MOUTH ONCE A DAY 07/28/12   Janith Lima, MD  pregabalin (LYRICA) 50 MG capsule Take 1 capsule (50 mg total) by mouth as needed. Patient taking differently: Take 50 mg by mouth daily. 10/24/12   Janith Lima, MD  psyllium (HYDROCIL/METAMUCIL) 95 % PACK Take 1 packet by mouth daily. Patient not taking: Reported on 04/24/2020 02/01/19   Thornell Mule, MD  sevelamer carbonate (RENVELA) 800 MG tablet Take 1,600 mg by mouth 2 (two) times daily. 04/14/20   [provider]  torsemide (DEMADEX) 100 MG tablet  08/17/19   [provider]  zolpidem (AMBIEN) 5 MG tablet TAKE 1 TABLET BY MOUTH AT BEDTIME AS NEEDED 07/20/12   Janith Lima, MD    Allergies    Crestor [rosuvastatin], Iodinated diagnostic agents, Pioglitazone, Sulfa antibiotics, Sulfonamide derivatives, Losartan, and Sulfasalazine  Review of Systems   Review of Systems  Constitutional:  Negative for chills and fever.  HENT:  Negative for ear pain and sore throat.   Eyes:  Negative for pain and visual disturbance.  Respiratory:  Negative for cough and shortness of breath.   Cardiovascular:  Negative for chest pain and palpitations.  Gastrointestinal:  Positive for abdominal pain. Negative for vomiting.  Genitourinary:  Positive for flank pain. Negative for dysuria and hematuria.  Musculoskeletal:  Negative for arthralgias and back pain.  Skin:  Negative for color change and rash.  Neurological:  Negative for seizures and syncope.  All other systems reviewed and are negative.  Physical Exam Updated Vital Signs BP (!) 130/59 (BP Location: Left Arm)   Pulse 66   Temp 98.4 F (36.9 C) (Oral)   Resp 17   Ht 5\' 3"  (1.6 m)   Wt 94.8 kg   SpO2 93%   BMI 37.02 kg/m   Physical Exam Vitals and nursing note reviewed.  Constitutional:      General: She is not in acute distress.    Appearance: She is well-developed.  HENT:     Head: Normocephalic and atraumatic.  Eyes:     Conjunctiva/sclera: Conjunctivae normal.  Cardiovascular:     Rate and Rhythm: Normal rate and regular rhythm.     Heart sounds: No murmur heard. Pulmonary:     Effort: Pulmonary effort is normal. No respiratory distress.     Breath sounds: Normal breath sounds.  Abdominal:     Palpations: Abdomen is soft.     Tenderness: There is abdominal tenderness. There is no right CVA  tenderness, left CVA tenderness, guarding or rebound.     Hernia: A hernia is present.     Comments: Left flank tenderness to palpation, left flank hernia palpated, reducible, mildly tender, peritoneal dialysis catheter in place, no surrounding erythema or tenderness, no peritonitis appreciated.  Musculoskeletal:        General: No swelling.     Cervical back: Neck supple.     Comments: Left flank MSK TTP, positive straight leg raise test on the left  Skin:    General: Skin is warm and dry.     Capillary Refill: Capillary refill takes less than 2 seconds.  Neurological:     Mental Status: She is alert.     Comments: MENTAL STATUS EXAM:    Orientation: Alert and oriented to person, place and time.  Memory: Cooperative, follows commands well.  Language: Speech is clear and language is normal.   CRANIAL NERVES:    CN 2 (Optic): Visual fields intact to confrontation.  CN 3,4,6 (EOM): Pupils equal and reactive to light. Full extraocular eye movement without nystagmus.  CN 5 (Trigeminal): Facial sensation is normal, no weakness of masticatory muscles.  CN 7 (Facial): No facial weakness or asymmetry.  CN 8 (Auditory): Auditory acuity grossly normal.  CN 9,10 (Glossophar): The uvula is midline, the palate elevates symmetrically.  CN 11 (spinal access): Normal sternocleidomastoid and trapezius strength.  CN 12 (Hypoglossal): The tongue is midline. No atrophy or fasciculations.Marland Kitchen   MOTOR:  Muscle Strength: 5/5RUE, 5/5LUE, 5/5RLE, 5/5LLE. .   SENSATION:   Intact to light touch all four extremities.     Psychiatric:        Mood and Affect: Mood normal.    ED Results / Procedures / Treatments   Labs (all labs ordered are listed, but only abnormal results are displayed) Labs Reviewed  CBC WITH DIFFERENTIAL/PLATELET - Abnormal; Notable for the following components:      Result Value   RBC 3.36 (*)    Hemoglobin 10.4 (*)    HCT 32.5 (*)    RDW 15.8 (*)    All other components within  normal limits  COMPREHENSIVE METABOLIC PANEL - Abnormal; Notable for the following components:   Glucose, Bld 152 (*)    BUN 46 (*)    Creatinine, Ser 5.94 (*)    Total Protein 6.0 (*)    Albumin 3.0 (*)    GFR, Estimated 7 (*)    All other components within normal limits  URINALYSIS, ROUTINE W REFLEX MICROSCOPIC - Abnormal; Notable for the following components:   Protein, ur 30 (*)    Bacteria, UA RARE (*)    All other components within normal limits  BODY FLUID CELL COUNT WITH DIFFERENTIAL - Abnormal; Notable for the following components:   Color, Fluid COLORLESS (*)    Appearance, Fluid HAZY (*)    Monocyte-Macrophage-Serous Fluid 1 (*)    All other components within normal limits  BODY FLUID CULTURE W GRAM STAIN  URINE CULTURE  LIPASE, BLOOD  LACTIC ACID, PLASMA    EKG EKG Interpretation  Date/Time:  Tuesday December 09 2020 22:05:33 EST Ventricular Rate:  72 PR Interval:  159 QRS Duration: 90 QT Interval:  410 QTC Calculation: 449 R Axis:   66 Text Interpretation: Sinus rhythm Atrial premature complex Low voltage, extremity and precordial leads Abnormal R-wave progression, early transition When compared with ECG of 06/07/2020, Low voltage QRS is now present Confirmed by Delora Fuel ([id number]) on 12/10/2020 12:21:39 AM  Radiology CT ABDOMEN PELVIS  WO CONTRAST  Result Date: 12/09/2020 CLINICAL DATA:  Left flank pain EXAM: CT ABDOMEN AND PELVIS WITHOUT CONTRAST TECHNIQUE: Multidetector CT imaging of the abdomen and pelvis was performed following the standard protocol without IV contrast. COMPARISON:  MRI abdomen 11/28/2020 and prior CT scan from 03/03/2019 FINDINGS: Lower chest: Stable emphysematous changes. No pulmonary lesions or pulmonary infiltrates or pleural effusions. Moderate-sized hiatal hernia is progressive. Stable aortic and coronary artery calcifications. Hepatobiliary: No hepatic lesions or intrahepatic biliary dilatation. The gallbladder is grossly normal. No  common bile duct dilatation. Pancreas: No mass, inflammation or ductal dilatation. Spleen: Normal size.  No focal lesions. Adrenals/Urinary Tract: Adrenal glands are unremarkable and stable. Moderate right-sided hydronephrosis likely due to a mild UPJ obstruction. No obstructing ureteral calculi. No bladder calculi. The left kidney demonstrates chronic scarring changes and is slightly malposition likely due to a bulging left flank hernia. No left renal mass or calculi. There are surgical changes near the kidney. No left-sided ureteral calculi. Small renal lesions are difficult to characterize without contrast. They appears stable when compared to the prior MRI. Stomach/Bowel: The stomach, duodenum, small bowel and colon are grossly normal. No acute inflammatory process, mass lesions or obstructive findings. There is fairly extensive diffuse colonic diverticulosis but no findings for acute diverticulitis. Vascular/Lymphatic: Advanced atherosclerotic calcifications involving the aorta and branch vessels but no aneurysm. No mesenteric or retroperitoneal mass or adenopathy. Reproductive: The uterus is surgically absent. Both ovaries are still present and appear normal. Other: Left lower quadrant/pelvic peritoneal dialysis catheter is noted. Small periumbilical abdominal wall hernia. Musculoskeletal: No significant bony findings. IMPRESSION: 1. Moderate right-sided hydronephrosis likely due to a mild UPJ obstruction. No obstructing ureteral calculi or bladder calculi. 2. Chronic scarring changes involving the left kidney. 3. Progressive moderate-sized hiatal hernia. 4. Advanced atherosclerotic calcifications involving the aorta and branch vessels. 5. Diffuse colonic diverticulosis without findings for acute diverticulitis. Aortic Atherosclerosis (ICD10-I70.0) and Emphysema (ICD10-J43.9). Electronically Signed   By: Marijo Sanes M.D.   On: 12/09/2020 16:36   CT L-SPINE NO CHARGE  Result Date: 12/09/2020 CLINICAL  DATA:  Back pain EXAM: CT LUMBAR SPINE WITHOUT CONTRAST TECHNIQUE: Multidetector CT imaging of the lumbar spine was performed without intravenous contrast administration. Multiplanar CT image reconstructions were also generated. COMPARISON:  CT 12/09/2020, MRI 06/06/2020 FINDINGS: Segmentation: 5 lumbar type vertebrae. Alignment: Grade 1 anterolisthesis L4 on L5. Vertebrae: No acute fracture or focal pathologic process. Paraspinal and other soft tissues: Aortic atherosclerosis. Moderate right hydronephrosis. Disc levels: At T12-L1, maintained disc space. No canal stenosis or foraminal narrowing. At L1-L2, vacuum disc. No canal stenosis. The foramen are patent bilaterally. At L2-L3, maintained disc space. No canal stenosis. The foramen are patent bilaterally. At L3-L4, maintained disc space. Mild diffuse disc bulge. No significant canal stenosis. Moderate facet degenerative changes and mild thickening of the ligamentum flavum. The foramen are patent bilaterally. At L4-L5, mild disc space narrowing with vacuum disc. Diffuse disc bulge. Hypertrophic facet degenerative change with irregular left greater than right facet arthropathy. Thickening of the ligamentum flavum results in moderate canal stenosis. There is mild bilateral foraminal narrowing. At L5-S1, mild disc space narrowing and vacuum discs. Suspected moderate disc bulge with possible extension of disc material to the left foramen. No significant canal stenosis. Advanced irregular facet degenerative changes. Bilateral foraminal narrowing. IMPRESSION: 1. No acute osseous abnormality. 2. Grade 1 anterolisthesis L4 on L5. Multilevel degenerative changes, worst at L4-L5 and L5-S1. Suspect that there is moderate severe foraminal stenosis left greater than right at L5-S1.  There is severe facet arthropathy at L4-L5 and L5-S1. 3. Hydronephrosis on the right as noted on the prior CT Electronically Signed   By: Donavan Foil M.D.   On: 12/09/2020 23:07     Procedures Procedures   Medications Ordered in ED Medications  lactated ringers bolus 1,000 mL (0 mLs Intravenous Stopped 12/09/20 1902)  morphine 4 MG/ML injection 4 mg (4 mg Intravenous Given 12/09/20 1910)  fentaNYL (SUBLIMAZE) injection 25 mcg (25 mcg Intravenous Given 12/09/20 2257)  HYDROcodone-acetaminophen (NORCO/VICODIN) 5-325 MG per tablet 1 tablet (1 tablet Oral Given 12/10/20 0045)    ED Course  I have reviewed the triage vital signs and the nursing notes.  Pertinent labs & imaging results that were available during my care of the patient were reviewed by me and considered in my medical decision making (see chart for details).    MDM Rules/Calculators/A&P                           80 year old female presenting to the emergency department with left flank pain.  She states that she has had left back pain and left flank pain.  She additionally endorses increased frequency associated with urination and some painful urination.  The pain in her left flank is sharp, shooting and radiates around her abdomen.  She has a right kidney and half of her left kidney due to previous mass in her left kidney that was surgically removed.   Takes hydrocodone at home for chronic pain without relief.  She states that she has chronic left flank pain but this pain has acutely worsened.  She recently had MRI imaging of the abdomen which revealed a 12 mm lesion in the lower pole of the left kidney which could be a complex cyst although neoplasm could also have this appearance with close follow-up recommended.  Multiple tiny hyperintensities were noted in both kidneys could reflect cysts, partial nephrectomy defect of the left kidney noted.  The patient also undergoes peritoneal dialysis.  She denies any cloudiness associated with her peritoneal fluid.  She denies any diffuse abdominal pain, nausea or vomiting.  She denies any fevers or chills.  On arrival, the patient was afebrile, hemodynamically  stable, saturating well on room air.  Physical exam significant for left flank tenderness to palpation In the left upper quadrant to the left flank.  No evidence of peritonitis.  We will evaluate for peritoneal fluid infection with cell count of the patient's peritoneal fluid.  Will evaluate for urinary tract infection or pyelonephritis with urinalysis.  Will evaluate for other abdominal emergency with CT of the abdomen pelvis and screening labs.  Lipase normal, lactic acid 1.6, CBC without a leukocytosis, CMP generally unremarkable, creatinine appropriately elevated for patient with known CKD on peritoneal dialysis.  Peritoneal fluid was collected and revealed 81 nucleated cells with a neutrophil count of 15, low concern for peritoneal fluid infection or peritonitis.  Urinalysis without evidence of urinary tract infection with rare bacteria, 6-10 WBCs, negative leukocytes and nitrites.  Low suspicion for pyelonephritis. T of the abdomen pelvis was performed which revealed moderate right-sided hydronephrosis likely due to a mild UPJ obstruction with no obstructing ureteric calculi or bladder calculi noted, chronic scarring changes involving the left kidney, progressive moderate size hiatal hernia, advanced atherosclerotic calcifications involving the aorta and branch vessels with colonic diverticulosis without evidence of diverticulitis.  Additionally noted in the body of the CT read was a bulging left flank hernia with  no left renal mass or calculi.  Surgical changes noted near the kidney.  Of note, the patient's discomfort and pain is located in the vicinity of her hernia.  It is easily reproducible and she has no lactic acidosis.  No concern for incarceration.  Suspect her discomfort is associated with this.  An L-spine CT reformat did reveal multilevel degenerative changes and spinal stenosis.  The patient does have a positive straight leg raise test on reassessment.  She has radicular pain but states that her  pain in her left flank is different from her chronic radicular pain associated with her chronic spinal stenosis.  No red flag symptoms to suggest acute spinal cord compression or need for emergent MRI at this time.    On repeat evaluation, patient has no rash to suggest shingles.  I suspect that her pain or discomfort is associated with this left flank hernia which is likely an incisional hernia in the setting of her prior left kidney surgery.  No evidence for incarceration at this time and the hernia is easily reducible.  No evidence for bowel obstruction.  Overall advised continued outpatient pain control per her outpatient pain regimen.  Stable for discharge.  Final Clinical Impression(s) / ED Diagnoses Final diagnoses:  Back pain  Other specified abdominal hernia without obstruction or gangrene  Spinal stenosis of lumbar region with neurogenic claudication    Rx / DC Orders ED Discharge Orders     None        Regan Lemming, MD 12/10/20 2232

## 2020-12-09 NOTE — Discharge Instructions (Addendum)
You were evaluated in the Emergency Department and after careful evaluation, we did not find any emergent condition requiring admission or further testing in the hospital.  Your exam/testing today was overall reassuring.  Your CT scan did not reveal acute abnormalities.  You have a chronic left flank hernia in the vicinity of your left kidney which does not appear to be obstructed.  You also have spinal stenosis resulting in sciatica.  Recommend you follow-up with your outpatient physicians for continued pain management.  Return to the emergency department if you have severe abdominal pain, nausea or vomiting, inability to pass stool, new fever.   Please return to the Emergency Department if you experience any worsening of your condition.  Thank you for allowing [place] to be a part of your care.

## 2020-12-09 NOTE — ED Notes (Signed)
Pt transported to CT ?

## 2020-12-09 NOTE — ED Triage Notes (Signed)
Pt BIB [facility] for lft back pain radiating to left flank. Pt endorses pain and freq. with urination. PT has right kidney and half of left kidney d/t previous mass on left kidney.  Another mass was recently discovered on remaining left kidney. PT is on daily peritoneal dialysis at home.  PT took hydorocodne around noon and states it did not help.  HEating pas has helped most at this time.

## 2020-12-10 DIAGNOSIS — M48062 Spinal stenosis, lumbar region with neurogenic claudication: Secondary | ICD-10-CM | POA: Diagnosis not present

## 2020-12-10 MED ORDER — HYDROCODONE-ACETAMINOPHEN 5-325 MG PO TABS
1.0000 | ORAL_TABLET | Freq: Once | ORAL | Status: AC
  Administered 2020-12-10: 1 via ORAL
  Filled 2020-12-10: qty 1

## 2020-12-10 NOTE — ED Notes (Signed)
Pt and family stated EDP would prescribe pain medicine, none found with d/c paperwork. Pt and family requesting to see primary RN, Josh, RN notified.

## 2020-12-10 NOTE — ED Notes (Signed)
Explained to pt that she will receive a one time dose of NORCO/VICODIN. She can use OTC Lidoderm patches. Pt was transferred into wheelchair and left with daughter. Two NT assisted pt getting to vehicle.

## 2020-12-11 LAB — URINE CULTURE: Culture: >=100000 — AB

## 2020-12-12 ENCOUNTER — Telehealth: Payer: Self-pay | Admitting: *Deleted

## 2020-12-12 LAB — PATHOLOGIST SMEAR REVIEW

## 2020-12-12 NOTE — Telephone Encounter (Signed)
Post ED Visit - Positive Culture Follow-up  Culture report reviewed by antimicrobial stewardship pharmacist: [place] Team []  Elenor Quinones, Pharm.D. []  Heide Guile, Pharm.D., BCPS AQ-ID []  Parks Neptune, Pharm.D., BCPS []  Alycia Rossetti, Pharm.D., BCPS []  [place], Pharm.D., BCPS, AAHIVP []  Legrand Como, Pharm.D., BCPS, AAHIVP []  Salome Arnt, PharmD, BCPS []  Johnnette Gourd, PharmD, BCPS []  Hughes Better, PharmD, BCPS []  Leeroy Cha, PharmD []  Laqueta Linden, PharmD, BCPS []  Albertina Parr, PharmD  [place] Team []  Leodis Sias, PharmD []  Lindell Spar, PharmD []  Royetta Asal, PharmD []  Graylin Shiver, Rph []  Rema Fendt) Glennon Mac, PharmD []  Arlyn Dunning, PharmD []  Netta Cedars, PharmD []  Dia Sitter, PharmD []  Leone Haven, PharmD []  Gretta Arab, PharmD []  Theodis Shove, PharmD []  Peggyann Juba, PharmD []  Reuel Boom, PharmD   Positive urine culture Do not treat and no further patient follow-up is required at this time. Sophia [place], PA-C  [place] [place] 12/12/2020, 8:27 AM

## 2020-12-13 LAB — BODY FLUID CULTURE W GRAM STAIN: Culture: NO GROWTH

## 2020-12-15 ENCOUNTER — Ambulatory Visit (INDEPENDENT_AMBULATORY_CARE_PROVIDER_SITE_OTHER): Payer: Medicare HMO | Admitting: Urology

## 2020-12-15 ENCOUNTER — Other Ambulatory Visit: Payer: Self-pay

## 2020-12-15 ENCOUNTER — Encounter: Payer: Self-pay | Admitting: Urology

## 2020-12-15 VITALS — BP 122/77 | HR 114 | Ht 63.0 in | Wt 202.0 lb

## 2020-12-15 DIAGNOSIS — M5440 Lumbago with sciatica, unspecified side: Secondary | ICD-10-CM | POA: Diagnosis not present

## 2020-12-15 DIAGNOSIS — N2889 Other specified disorders of kidney and ureter: Secondary | ICD-10-CM | POA: Diagnosis not present

## 2020-12-15 DIAGNOSIS — N1330 Unspecified hydronephrosis: Secondary | ICD-10-CM

## 2020-12-15 NOTE — Progress Notes (Signed)
12/15/2020 1:04 PM   Megan [name] 1940-04-24 [id number]  Referring provider: Lavonia Dana, MD [address] [place],  [place] [address]  Chief Complaint  Patient presents with   renal mass    HPI: Megan [name] is an 80 y.o. female referred for evaluation of a left renal mass.  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  Since her visit she states her pain is now located in her sacral region and she has loss of sensation of rectal urgency/fullness and having fecal soiling.  She also complains of increased urinary frequency, urgency and has previously been oliguric secondary to her ESRD CT abdomen pelvis without contrast was performed 12/09/2020 which showed moderate hydronephrosis   PMH: Past Medical History:  Diagnosis Date   Adenomatous colon polyp    Allergic rhinitis    Asthma    Cancer ([place])    kidney   COPD (chronic obstructive pulmonary disease) (HCC)    Diabetes mellitus type II    Diverticulosis of colon    Esophageal stricture    Fatty liver    Gastritis    GERD (gastroesophageal reflux disease)    Gout    History of chickenpox    History of CVA (cerebrovascular accident) 2004   Hyperlipidemia    Hypertension    Internal hemorrhoids    Kidney tumor 11/2012   Loss of smell 2004   due to CVA   Periumbilical hernia    Renal insufficiency    Umbilical hernia     Surgical History: Past Surgical History:  Procedure Laterality Date   ABDOMINAL HYSTERECTOMY     ABDOMINAL HYSTERECTOMY     APPENDECTOMY     CAPD INSERTION N/A 01/31/2019   Procedure: LAPAROSCOPIC INSERTION CONTINUOUS AMBULATORY PERITONEAL DIALYSIS  (CAPD)  CATHETER;  Surgeon: Algernon Huxley, MD;  Location: [facility] ORS;  Service: General;  Laterality: N/A;   CESAREAN SECTION     x 2   COLONOSCOPY WITH PROPOFOL N/A 07/23/2015   Procedure: COLONOSCOPY WITH PROPOFOL;  Surgeon: Manya Silvas, MD;  Location: [facility] ENDOSCOPY;  Service: Endoscopy;  Laterality: N/A;   ESOPHAGOGASTRODUODENOSCOPY (EGD) WITH PROPOFOL N/A 07/23/2015   Procedure: ESOPHAGOGASTRODUODENOSCOPY (EGD) WITH PROPOFOL;  Surgeon: Manya Silvas, MD;  Location: [facility] ENDOSCOPY;  Service: Endoscopy;  Laterality: N/A;   HEMORROIDECTOMY     KIDNEY SURGERY      Home Medications:  Allergies as of 12/15/2020       Reactions   Crestor [rosuvastatin]    myopathy   Iodinated Diagnostic Agents Other (See Comments)   Nausea and SOB per patient Nausea and SOB per patient   Pioglitazone    REACTION: weight gain   Sulfa Antibiotics Hives   REACTION: bLISTERS   Sulfonamide Derivatives    REACTION: bLISTERS   Losartan Swelling   Sulfasalazine Hives   REACTION: bLISTERS        Medication List        Accurate as of December 15, 2020  1:04 PM. If you have any questions, ask your nurse or doctor.          STOP taking these medications    lisinopril 20 MG tablet Commonly known as: ZESTRIL Stopped  by: Abbie Sons, MD   metoprolol tartrate 25 MG tablet Commonly known as: LOPRESSOR Stopped by: Abbie Sons, MD   psyllium 95 % Pack Commonly known as: HYDROCIL/METAMUCIL Stopped by: Abbie Sons, MD       TAKE these medications    acetaminophen 325 MG tablet Commonly known as: TYLENOL Take 2 tablets (650 mg total) by mouth every 6 (six) hours as needed for mild pain (or Fever >/= 101).   albuterol 108 (90 Base) MCG/ACT inhaler Commonly known as: VENTOLIN HFA 2 puffs every 6 hours as needed for wheeze/ cough   allopurinol 100 MG tablet Commonly known as: ZYLOPRIM Take 100 mg by mouth daily.   Anoro Ellipta 62.5-25 MCG/ACT Aepb Generic drug:  umeclidinium-vilanterol Inhale 1 puff by mouth once daily   atorvastatin 40 MG tablet Commonly known as: LIPITOR Take 40 mg by mouth daily.   calcitRIOL 0.25 MCG capsule Commonly known as: ROCALTROL TAKE 1 CAPSULE BY MOUTH THREE TIMES A WEEK   ergocalciferol 1.25 MG (50000 UT) capsule Commonly known as: VITAMIN D2 Take 50,000 Units by mouth every 30 (thirty) days.   hyoscyamine 0.125 MG SL tablet Commonly known as: LEVSIN SL Place 0.125 mg under the tongue every 6 (six) hours as needed.   insulin aspart 100 UNIT/ML injection Commonly known as: NovoLOG Inject 6 Units into the skin daily before supper. Pens What changed:  when to take this additional instructions   insulin degludec 200 UNIT/ML FlexTouch Pen Commonly known as: TRESIBA Inject 28 Units into the skin daily.   mupirocin cream 2 % Commonly known as: BACTROBAN   nitroGLYCERIN 0.4 MG SL tablet Commonly known as: NITROSTAT Place 0.4 mg under the tongue every 5 (five) minutes as needed for chest pain.   ondansetron 4 MG tablet Commonly known as: ZOFRAN Take 1 tablet (4 mg total) by mouth every 6 (six) hours as needed for nausea.   pantoprazole 40 MG tablet Commonly known as: PROTONIX Take 40 mg by mouth daily.   Plavix 75 MG tablet Generic drug: clopidogrel TAKE 1 TABLET BY MOUTH ONCE A DAY   pregabalin 50 MG capsule Commonly known as: LYRICA Take 1 capsule (50 mg total) by mouth as needed. What changed: when to take this   sevelamer carbonate 800 MG tablet Commonly known as: RENVELA Take 1,600 mg by mouth 2 (two) times daily.   torsemide 100 MG tablet Commonly known as: DEMADEX   Trelegy Ellipta 100-62.5-25 MCG/ACT Aepb Generic drug: Fluticasone-Umeclidin-Vilant Inhale 1 puff into the lungs daily.   zolpidem 5 MG tablet Commonly known as: AMBIEN TAKE 1 TABLET BY MOUTH AT BEDTIME AS NEEDED        Allergies:  Allergies  Allergen Reactions   Crestor [Rosuvastatin]     myopathy    Iodinated Diagnostic Agents Other (See Comments)    Nausea and SOB per patient Nausea and SOB per patient   Pioglitazone     REACTION: weight gain   Sulfa Antibiotics Hives    REACTION: bLISTERS   Sulfonamide Derivatives     REACTION: bLISTERS   Losartan Swelling   Sulfasalazine Hives    REACTION: bLISTERS    Family History: Family History  Problem Relation Age of Onset   GER disease Father    Heart failure Father    Colon polyps Father    Ovarian cancer Mother    Colon cancer Other        aunt   Diabetes Other    Colon polyps Other  aunt   CAD Neg Hx     Social History:  reports that she quit smoking about 18 years ago. Her smoking use included cigarettes. She has a 30.00 pack-year smoking history. She has never used smokeless tobacco. She reports that she does not drink alcohol and does not use drugs.   Physical Exam: BP 122/77   Pulse (!) 114   Ht 5\' 3"  (1.6 m)   Wt 202 lb (91.6 kg)   BMI 35.78 kg/m   Constitutional:  Alert and oriented, No acute distress. HEENT: [place] AT, moist mucus membranes.  Trachea midline, no masses. Cardiovascular: No clubbing, cyanosis, or edema. Respiratory: Normal respiratory effort, no increased work of breathing. Psychiatric: Normal mood and affect.   Assessment & Plan:    1.  Renal mass Noncontrast MRI felt to show 12 mm near isointense [name] pole renal mass We discussed possibility of recurrent renal cell carcinoma.  Based on her significant medical comorbidities I recommended follow-up MRI in ~ 4 months.  Ms. Ziska and her family were not comfortable with surveillance and requested a second opinion at [facility].  Referral was placed  3.  Right hydronephrosis This was also present on prior MRI and not a new finding on CT  2.  Left back pain Do not feel this is renal related and suspicious for a neurologic etiology with loss of fecal sensation and urinary frequency/urgency Recommend appointment at [facility] today with  either her PCP or urgent care if a PCP appointment was not available    Abbie Sons, [place] [address] [place], [place] [address] [phone]

## 2021-01-02 ENCOUNTER — Ambulatory Visit (INDEPENDENT_AMBULATORY_CARE_PROVIDER_SITE_OTHER): Payer: Medicare HMO | Admitting: Nurse Practitioner

## 2021-01-02 ENCOUNTER — Encounter: Payer: Self-pay | Admitting: Nurse Practitioner

## 2021-01-02 ENCOUNTER — Other Ambulatory Visit: Payer: Self-pay

## 2021-01-02 ENCOUNTER — Ambulatory Visit (INDEPENDENT_AMBULATORY_CARE_PROVIDER_SITE_OTHER): Payer: Medicare HMO

## 2021-01-02 VITALS — BP 140/58 | HR 102 | Temp 97.8°F | Ht 63.0 in | Wt 209.2 lb

## 2021-01-02 DIAGNOSIS — J302 Other seasonal allergic rhinitis: Secondary | ICD-10-CM

## 2021-01-02 DIAGNOSIS — R058 Other specified cough: Secondary | ICD-10-CM

## 2021-01-02 DIAGNOSIS — K219 Gastro-esophageal reflux disease without esophagitis: Secondary | ICD-10-CM | POA: Diagnosis not present

## 2021-01-02 DIAGNOSIS — J449 Chronic obstructive pulmonary disease, unspecified: Secondary | ICD-10-CM

## 2021-01-02 DIAGNOSIS — J3089 Other allergic rhinitis: Secondary | ICD-10-CM | POA: Diagnosis not present

## 2021-01-02 MED ORDER — SPACER/AERO-HOLDING CHAMBERS DEVI: 0 refills | Status: DC

## 2021-01-02 MED ORDER — BREZTRI AEROSPHERE 160-9-4.8 MCG/ACT IN AERO: 2.0000 | INHALATION_SPRAY | Freq: Two times a day (BID) | RESPIRATORY_TRACT | 3 refills | Status: DC

## 2021-01-02 MED ORDER — MONTELUKAST SODIUM 10 MG PO TABS
10.0000 mg | ORAL_TABLET | Freq: Every day | ORAL | 11 refills | Status: DC
Start: 2021-01-02 — End: 2021-02-03

## 2021-01-02 MED ORDER — FLUTICASONE PROPIONATE 50 MCG/ACT NA SUSP: 1.0000 | Freq: Every day | NASAL | 2 refills | Status: DC

## 2021-01-02 NOTE — Assessment & Plan Note (Signed)
Poorly controlled. Initiate therapy with Singulair and flonase. See above plan.

## 2021-01-02 NOTE — Assessment & Plan Note (Signed)
Suspect hoarseness and cough related to irritation from Trelegy DPI and post nasal drip. Trial change to Breztri 2 puffs Twice daily with spacer. Optimize allergy treatment with Singulair and Flonase nasal spray. Supportive care with guaifenesin and saline nasal spray. CXR today to ensure no infectious etiology.  Patient Instructions  Change to [facility] inhaler 2 puffs Twice daily, brush tongue and rinse mouth afterwards. Use with spacer   Continue albuterol inhaler 2 puffs as needed for shortness of breath or wheezing  Continue protonix 40 mg daily   -Singulair 10 mg At bedtime  -Flonase  nasal spray 1-2 sprays each nostril daily   -Mucous relief guaifenesin over the counter 600 mg Twice daily -Saline nasal spray 2-3 times a day as needed for nasal congestion/postnasal drip until symptoms improve  Notify if worsening breathlessness, cough, mucus production, fatigue, or wheezing occurs.  Maintain up to date vaccinations, including influenza, COVID, and pneumococcal.  Wash your hands often and avoid sick exposures.  Encouraged masking in crowds.  Avoid triggers, when possible.  Exercise, as tolerated. Notify if worsening symptoms upon exertion occur.  Follow up in one month with Dr. Annamaria Boots, Roxan Diesel, NP, APP. If symptoms do not improve or worsen, please contact office for sooner follow up or seek emergency care.

## 2021-01-02 NOTE — Assessment & Plan Note (Signed)
Continue current regimen with PPI.

## 2021-01-02 NOTE — Assessment & Plan Note (Signed)
Improvement in breathing after initiation with Trelegy. Switch to [facility] with spacer due to upper airway irritation. Consider repeat PFTs if cough persists despite therapy change. See above plan.

## 2021-01-02 NOTE — Progress Notes (Signed)
@Patient  ID: Megan [name], female    DOB: [birth year], 80 y.o.   MRN: [id number]  Chief Complaint  Patient presents with   Follow-up    Feeling better.    Referring provider: Tracie Harrier, MD  HPI: 80 year old female, former smoker (30 pack years) followed for allergic rhinitis, asthma/COPD, and pulmonary nodule. She is a patient of Dr. Janee [name] and was last seen in office on 04/24/2020. 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  TEST/EVENTS:  10/26/2011 PFTs: FVC 2.48 (93), FEV1 1.8 (96), ratio 71, TLC 3.98 (86), DLCO 10.7 (46). Mild obstructive airway disease, normal lung volume, diffusion moderately reduced, no BD 12/30/2017 echocardiogram: LVEF 60 to 65%.  G1 DD.  Mild MV regurgitation.  AVA moderately thickened, moderately calcified, without regurgitation or stenosis. 02/21/2019 CT chest: Atherosclerosis.  No lymphadenopathy.  Small sliding hiatal hernia.  Moderate centrilobular emphysema.  2 mm subpleural nodule inferior laterally in the right upper lobe, 4 mm left [name] lobe nodule, second 4 mm left [name] lobe nodule, and a 2 mm left [name] lobe nodule all unchanged.  Minimal atelectasis in bases. 04/24/2020 CXR 2 view: Both lungs clear.  Small hiatal hernia.  No acute cardiopulmonary findings.  04/24/2020: virtual visit with Dr. Annamaria [name]. DOE, frequent nasal congestion, pains left lateral chest. Previously on Advair and Anoro without relief. Trial Trelegy 100.   01/02/2021: Today - follow up  Patient presents today for follow up. She was previously started on Trelegy inhaler in April and has since developed a cough and hoarseness. The cough has progressively worsened and is described as dry. Her hoarseness comes and goes. She does have some post nasal drip and clear rhinorrhea when her allergies flare. She reports her breathing is better since starting on the Trelegy inhaler. She states that her daughter  occasionally notices her wheezing on the phone, which is relieved with her rescue inhaler. She denies orthopnea, PND, hemoptysis, chest pain, or [name] extremity swelling. She continues on protonix daily. She reports occasional use of her albuterol inhaler. Her renal cancer has returned and she is considering not undergoing surgery or chemo again. She continues on PD dialysis nightly. Overall, she feels better but is concerned about her cough.   Allergies  Allergen Reactions   Crestor [Rosuvastatin]     myopathy   Iodinated Diagnostic Agents Other (See Comments)    Nausea and SOB per patient Nausea and SOB per patient   Pioglitazone     REACTION: weight gain   Sulfa Antibiotics Hives    REACTION: bLISTERS   Sulfonamide Derivatives     REACTION: bLISTERS   Losartan Swelling   Sulfasalazine Hives    REACTION: bLISTERS    Immunization History  Administered Date(s) Administered   Influenza Split 09/19/2010, 10/05/2011, 09/18/2012, 10/15/2013   Influenza Whole 09/18/2009   Influenza, High Dose Seasonal PF 11/04/2015, 11/11/2017, 11/20/2020   Influenza-Unspecified 11/04/2015, 11/24/2016, 10/19/2018, 10/12/2019   Moderna SARS-COV2 Booster Vaccination 10/19/2019, 04/22/2020   Moderna Sars-Covid-2 Vaccination 02/19/2019, 03/12/2019   PFIZER Comirnaty(Gray Top)Covid-19 Tri-Sucrose Vaccine 02/19/2019, 03/12/2019, 10/12/2019   Pneumococcal Polysaccharide-23 01/19/2008, 05/17/2011, 11/17/2015   Pneumococcal-Unspecified 10/19/2018   Td 01/19/2004    Past Medical History:  Diagnosis Date   Adenomatous colon polyp    Allergic rhinitis    Asthma    Cancer ([place])    kidney   COPD (chronic obstructive pulmonary disease) ([place])    Diabetes mellitus type  II    Diverticulosis of colon    Esophageal stricture    Fatty liver    Gastritis    GERD (gastroesophageal reflux disease)    Gout    History of chickenpox    History of CVA (cerebrovascular accident) 2004   Hyperlipidemia     Hypertension    Internal hemorrhoids    Kidney tumor 11/2012   Loss of smell 2004   due to CVA   Periumbilical hernia    Renal insufficiency    Umbilical hernia     Tobacco History: Social History   Tobacco Use  Smoking Status Former   Packs/day: 1.00   Years: 30.00   Pack years: 30.00   Types: Cigarettes   Quit date: 01/18/2002   Years since quitting: 18.9  Smokeless Tobacco Never   Counseling given: Not Answered   Outpatient Medications Prior to Visit  Medication Sig Dispense Refill   acetaminophen (TYLENOL) 325 MG tablet Take 2 tablets (650 mg total) by mouth every 6 (six) hours as needed for mild pain (or Fever >/= 101). 30 tablet 0   albuterol (PROVENTIL HFA;VENTOLIN HFA) 108 (90 Base) MCG/ACT inhaler 2 puffs every 6 hours as needed for wheeze/ cough 1 Inhaler 12   allopurinol (ZYLOPRIM) 100 MG tablet Take 100 mg by mouth daily.     atorvastatin (LIPITOR) 40 MG tablet Take 40 mg by mouth daily.     calcitRIOL (ROCALTROL) 0.25 MCG capsule TAKE 1 CAPSULE BY MOUTH THREE TIMES A WEEK  5   ergocalciferol (VITAMIN D2) 50000 UNITS capsule Take 50,000 Units by mouth every 30 (thirty) days.     HYDROcodone-acetaminophen (NORCO/VICODIN) 5-325 MG tablet      insulin aspart (NOVOLOG) 100 UNIT/ML injection Inject 6 Units into the skin daily before supper. Pens (Patient taking differently: Inject 6 Units into the skin in the morning, at noon, in the evening, and at bedtime. Sliding scale 8 units am 8 units lunch 12 units at dinner 6 units at HS) 15 mL 1   insulin degludec (TRESIBA) 200 UNIT/ML FlexTouch Pen Inject 36 Units into the skin daily.     multivitamin (RENA-VIT) TABS tablet Take 1 tablet by mouth daily.     mupirocin cream (BACTROBAN) 2 %      nitroGLYCERIN (NITROSTAT) 0.4 MG SL tablet Place 0.4 mg under the tongue every 5 (five) minutes as needed for chest pain.     pantoprazole (PROTONIX) 40 MG tablet Take 40 mg by mouth daily.     PLAVIX 75 MG tablet TAKE 1 TABLET BY  MOUTH ONCE A DAY 90 tablet 2   potassium chloride (MICRO-K) 10 MEQ CR capsule Take by mouth.     pregabalin (LYRICA) 50 MG capsule Take 1 capsule (50 mg total) by mouth as needed. (Patient taking differently: Take 50 mg by mouth daily.) 30 capsule 8   sevelamer carbonate (RENVELA) 800 MG tablet Take 1,600 mg by mouth 2 (two) times daily.     torsemide (DEMADEX) 100 MG tablet      Fluticasone-Umeclidin-Vilant (TRELEGY ELLIPTA) 100-62.5-25 MCG/ACT AEPB Inhale 1 puff into the lungs daily. 60 each 11   ANORO ELLIPTA 62.5-25 MCG/INH AEPB Inhale 1 puff by mouth once daily (Patient not taking: Reported on 01/02/2021) 60 each 1   hyoscyamine (LEVSIN SL) 0.125 MG SL tablet Place 0.125 mg under the tongue every 6 (six) hours as needed.      ondansetron (ZOFRAN) 4 MG tablet Take 1 tablet (4 mg total) by mouth every 6 (  six) hours as needed for nausea. (Patient not taking: Reported on 01/02/2021) 20 tablet 0   zolpidem (AMBIEN) 5 MG tablet TAKE 1 TABLET BY MOUTH AT BEDTIME AS NEEDED (Patient not taking: Reported on 01/02/2021) 30 tablet 2   No facility-administered medications prior to visit.     Review of Systems:   Constitutional: No weight loss or gain, night sweats, fevers, chills, fatigue, or lassitude. HEENT: No headaches, difficulty swallowing, tooth/dental problems, or sore throat. No sneezing, itching, ear ache. +occasional nasal congestion, clear rhinorrhea and post nasal drip; hoarseness CV:  No chest pain, orthopnea, PND, swelling in [name] extremities, anasarca, dizziness, palpitations, syncope Resp: +shortness of breath with exertion (improved); non productive cough; occasional wheeze. No excess mucus or change in color of mucus. No hemoptysis.No chest wall deformity GI:  No heartburn, indigestion, abdominal pain, nausea, vomiting, diarrhea, change in bowel habits, loss of appetite, bloody stools.  GU: No dysuria, change in color of urine, urgency or frequency.  No flank pain, no hematuria   Skin: No rash, lesions, ulcerations MSK:  No joint pain or swelling.  No decreased range of motion.  No back pain. Neuro: No dizziness or lightheadedness.  Psych: No depression or anxiety. Mood stable.     Physical Exam:  BP (!) 140/58 (BP Location: Left Arm, Patient Position: Sitting, Cuff Size: Normal)    Pulse (!) 102    Temp 97.8 F (36.6 C) (Oral)    Ht 5\' 3"  (1.6 m)    Wt 209 lb 3.2 oz (94.9 kg)    SpO2 95%    BMI 37.06 kg/m   GEN: Pleasant, interactive, chronically-ill appearing; obese; in no acute distress. HEENT:  Normocephalic and atraumatic. EACs patent bilaterally. TM pearly gray with present light reflex bilaterally. PERRLA. Sclera white. Nasal turbinates pink, moist and patent bilaterally. Clear rhinorrhea present. Oropharynx erythematous and moist, without exudate or edema. No lesions, ulcerations NECK:  Supple w/ fair ROM. No JVD present. Normal carotid impulses w/o bruits. Thyroid symmetrical with no goiter or nodules palpated. No lymphadenopathy.   CV: RRR, no m/r/g, no peripheral edema. Pulses intact, +2 bilaterally. No cyanosis, pallor or clubbing. PULMONARY:  Unlabored, regular breathing. Diminished bilaterally posteriorly w/o wheezes/rales/rhonchi. No accessory muscle use. No dullness to percussion. GI: BS present and normoactive. Soft, non-tender to palpation. No organomegaly or masses detected. No CVA tenderness. MSK: No erythema, warmth or tenderness. Cap refil <2 sec all extrem. No deformities or joint swelling noted.  Neuro: A/Ox3. No focal deficits noted.   Skin: Warm, no lesions or rashe Psych: Normal affect and behavior. Judgement and thought content appropriate.     Lab Results:  CBC    Component Value Date/Time   WBC 9.2 12/09/2020 1748   RBC 3.36 (L) 12/09/2020 1748   HGB 10.4 (L) 12/09/2020 1748   HGB 12.0 11/23/2013 0358   HCT 32.5 (L) 12/09/2020 1748   HCT 36.8 11/23/2013 0358   HCT 37 10/01/2010 0000   PLT 215 12/09/2020 1748   PLT 243  11/23/2013 0358   PLT 208 10/01/2010 0000   MCV 96.7 12/09/2020 1748   MCV 86 11/23/2013 0358   MCH 31.0 12/09/2020 1748   MCHC 32.0 12/09/2020 1748   RDW 15.8 (H) 12/09/2020 1748   RDW 15.2 (H) 11/23/2013 0358   RDW 14.0 10/01/2010 0000   LYMPHSABS 2.2 12/09/2020 1748   LYMPHSABS 3.1 11/23/2013 0358   MONOABS 0.8 12/09/2020 1748   MONOABS 0.7 11/23/2013 0358   EOSABS 0.0 12/09/2020 1748  EOSABS 0.2 11/23/2013 0358   BASOSABS 0.0 12/09/2020 1748   BASOSABS 0.0 11/23/2013 0358    BMET    Component Value Date/Time   NA 137 12/09/2020 1748   NA 142 11/23/2013 0358   K 4.0 12/09/2020 1748   K 4.4 11/23/2013 0358   K 4.6 10/01/2010 0000   CL 99 12/09/2020 1748   CL 108 (H) 11/23/2013 0358   CL 103 10/01/2010 0000   CO2 25 12/09/2020 1748   CO2 26 11/23/2013 0358   CO2 23 10/01/2010 0000   GLUCOSE 152 (H) 12/09/2020 1748   GLUCOSE 81 11/23/2013 0358   BUN 46 (H) 12/09/2020 1748   BUN 43 (H) 11/23/2013 0358   CREATININE 5.94 (H) 12/09/2020 1748   CREATININE 2.73 (H) 11/23/2013 0358   CREATININE 1.9 10/01/2010 0000   CALCIUM 9.0 12/09/2020 1748   CALCIUM 8.7 11/23/2013 0358   CALCIUM 9.0 10/01/2010 0000   CALCIUM 9.3 10/01/2010 0000   GFRNONAA 7 (L) 12/09/2020 1748   GFRNONAA 18 (L) 11/23/2013 0358   GFRAA 19 (L) 01/31/2019 0632   GFRAA 22 (L) 11/23/2013 0358    BNP    Component Value Date/Time   BNP 53.0 01/28/2019 1401     Imaging:  CT ABDOMEN PELVIS WO CONTRAST  Result Date: 12/09/2020 CLINICAL DATA:  Left flank pain EXAM: CT ABDOMEN AND PELVIS WITHOUT CONTRAST TECHNIQUE: Multidetector CT imaging of the abdomen and pelvis was performed following the standard protocol without IV contrast. COMPARISON:  MRI abdomen 11/28/2020 and prior CT scan from 03/03/2019 FINDINGS: [name] chest: Stable emphysematous changes. No pulmonary lesions or pulmonary infiltrates or pleural effusions. Moderate-sized hiatal hernia is progressive. Stable aortic and coronary artery  calcifications. Hepatobiliary: No hepatic lesions or intrahepatic biliary dilatation. The gallbladder is grossly normal. No common bile duct dilatation. Pancreas: No mass, inflammation or ductal dilatation. Spleen: Normal size.  No focal lesions. Adrenals/Urinary Tract: Adrenal glands are unremarkable and stable. Moderate right-sided hydronephrosis likely due to a mild UPJ obstruction. No obstructing ureteral calculi. No bladder calculi. The left kidney demonstrates chronic scarring changes and is slightly malposition likely due to a bulging left flank hernia. No left renal mass or calculi. There are surgical changes near the kidney. No left-sided ureteral calculi. Small renal lesions are difficult to characterize without contrast. They appears stable when compared to the prior MRI. Stomach/Bowel: The stomach, duodenum, small bowel and colon are grossly normal. No acute inflammatory process, mass lesions or obstructive findings. There is fairly extensive diffuse colonic diverticulosis but no findings for acute diverticulitis. Vascular/Lymphatic: Advanced atherosclerotic calcifications involving the aorta and branch vessels but no aneurysm. No mesenteric or retroperitoneal mass or adenopathy. Reproductive: The uterus is surgically absent. Both ovaries are still present and appear normal. Other: Left [name] quadrant/pelvic peritoneal dialysis catheter is noted. Small periumbilical abdominal wall hernia. Musculoskeletal: No significant bony findings. IMPRESSION: 1. Moderate right-sided hydronephrosis likely due to a mild UPJ obstruction. No obstructing ureteral calculi or bladder calculi. 2. Chronic scarring changes involving the left kidney. 3. Progressive moderate-sized hiatal hernia. 4. Advanced atherosclerotic calcifications involving the aorta and branch vessels. 5. Diffuse colonic diverticulosis without findings for acute diverticulitis. Aortic Atherosclerosis (ICD10-I70.0) and Emphysema (ICD10-J43.9).  Electronically Signed   By: Marijo Sanes M.D.   On: 12/09/2020 16:36   CT L-SPINE NO CHARGE  Result Date: 12/09/2020 CLINICAL DATA:  Back pain EXAM: CT LUMBAR SPINE WITHOUT CONTRAST TECHNIQUE: Multidetector CT imaging of the lumbar spine was performed without intravenous contrast administration. Multiplanar CT image reconstructions  were also generated. COMPARISON:  CT 12/09/2020, MRI 06/06/2020 FINDINGS: Segmentation: 5 lumbar type vertebrae. Alignment: Grade 1 anterolisthesis L4 on L5. Vertebrae: No acute fracture or focal pathologic process. Paraspinal and other soft tissues: Aortic atherosclerosis. Moderate right hydronephrosis. Disc levels: At T12-L1, maintained disc space. No canal stenosis or foraminal narrowing. At L1-L2, vacuum disc. No canal stenosis. The foramen are patent bilaterally. At L2-L3, maintained disc space. No canal stenosis. The foramen are patent bilaterally. At L3-L4, maintained disc space. Mild diffuse disc bulge. No significant canal stenosis. Moderate facet degenerative changes and mild thickening of the ligamentum flavum. The foramen are patent bilaterally. At L4-L5, mild disc space narrowing with vacuum disc. Diffuse disc bulge. Hypertrophic facet degenerative change with irregular left greater than right facet arthropathy. Thickening of the ligamentum flavum results in moderate canal stenosis. There is mild bilateral foraminal narrowing. At L5-S1, mild disc space narrowing and vacuum discs. Suspected moderate disc bulge with possible extension of disc material to the left foramen. No significant canal stenosis. Advanced irregular facet degenerative changes. Bilateral foraminal narrowing. IMPRESSION: 1. No acute osseous abnormality. 2. Grade 1 anterolisthesis L4 on L5. Multilevel degenerative changes, worst at L4-L5 and L5-S1. Suspect that there is moderate severe foraminal stenosis left greater than right at L5-S1. There is severe facet arthropathy at L4-L5 and L5-S1. 3.  Hydronephrosis on the right as noted on the prior CT Electronically Signed   By: Donavan Foil M.D.   On: 12/09/2020 23:07      No flowsheet data found.  No results found for: NITRICOXIDE      Assessment & Plan:   Upper airway cough syndrome Suspect hoarseness and cough related to irritation from Trelegy DPI and post nasal drip. Trial change to Breztri 2 puffs Twice daily with spacer. Optimize allergy treatment with Singulair and Flonase nasal spray. Supportive care with guaifenesin and saline nasal spray. CXR today to ensure no infectious etiology.  Patient Instructions  Change to [facility] inhaler 2 puffs Twice daily, brush tongue and rinse mouth afterwards. Use with spacer   Continue albuterol inhaler 2 puffs as needed for shortness of breath or wheezing  Continue protonix 40 mg daily   -Singulair 10 mg At bedtime  -Flonase  nasal spray 1-2 sprays each nostril daily   -Mucous relief guaifenesin over the counter 600 mg Twice daily -Saline nasal spray 2-3 times a day as needed for nasal congestion/postnasal drip until symptoms improve  Notify if worsening breathlessness, cough, mucus production, fatigue, or wheezing occurs.  Maintain up to date vaccinations, including influenza, COVID, and pneumococcal.  Wash your hands often and avoid sick exposures.  Encouraged masking in crowds.  Avoid triggers, when possible.  Exercise, as tolerated. Notify if worsening symptoms upon exertion occur.  Follow up in one month with Dr. Annamaria [name], Roxan Diesel, NP, APP. If symptoms do not improve or worsen, please contact office for sooner follow up or seek emergency care.    Asthma-COPD overlap syndrome ([place]) Improvement in breathing after initiation with Trelegy. Switch to [facility] with spacer due to upper airway irritation. Consider repeat PFTs if cough persists despite therapy change. See above plan.  Seasonal and perennial allergic rhinitis Poorly controlled. Initiate therapy with Singulair and  flonase. See above plan.  GERD Continue current regimen with PPI.      Clayton Bibles, NP 01/02/2021  Pt aware and understands NP's role.

## 2021-01-02 NOTE — Patient Instructions (Addendum)
Stop Trelegy inhaler. Change to Breztri inhaler 2 puffs Twice daily, brush tongue and rinse mouth afterwards. Use with spacer   Continue albuterol inhaler 2 puffs as needed for shortness of breath or wheezing  Continue protonix 40 mg daily   -Singulair 10 mg At bedtime  -Flonase  nasal spray 1-2 sprays each nostril daily   -Mucous relief guaifenesin over the counter 600 mg Twice daily -Saline nasal spray 2-3 times a day as needed for nasal congestion/postnasal drip until symptoms improve  Notify if worsening breathlessness, cough, mucus production, fatigue, or wheezing occurs.  Maintain up to date vaccinations, including influenza, COVID, and pneumococcal.  Wash your hands often and avoid sick exposures.  Encouraged masking in crowds.  Avoid triggers, when possible.  Exercise, as tolerated. Notify if worsening symptoms upon exertion occur.  Follow up in one month with Dr. Annamaria Boots, Roxan Diesel, NP, APP. If symptoms do not improve or worsen, please contact office for sooner follow up or seek emergency care.

## 2021-02-02 NOTE — Progress Notes (Signed)
HPI female former smoker followed for allergic rhinitis, asthma/COPD, history urticaria, complicated by , DM, GERD, history  CVA 2004.MRI brain 2015. Kidney cancer/ partial nephrectomy 2015  PFT 10/26/2011 mild obstructive airways disease, small airways, insignificant response to bronchodilator. Diffusion moderately reduced. FVC 2.46/92%, FEV1 1.76/94%, FEV1/FVC 0.71, FEF 25/75% 1.13/52%. TLC 86%, DLCO 46%. 6 MWT-99%, 97%, 99%, 297.5 m with complaint of right calf pain but no oxygen limitation V/Q scan 12/29/17- No appreciable ventilation or perfusion defects. Very low probability of pulmonary embolus. CXR 12/29/17- No acute disease. US venous ultrasound-12/27/17- 1. No femoropopliteal and no calf DVT in the visualized calf veins. If clinical symptoms are inconsistent or if there are persistent or worsening symptoms, further imaging (possibly involving the iliac veins) may be warranted. 2. Small left Baker's cyst. ECHOcardiogram-12/30/2017-EF 82-50%, grade 1 diastolic dysfunction, restricted mobility aortic valve, mild mitral regurgitation.  No pulmonary hypertension. Lab 12/26/2017-BUN 38, creatinine 1.90, BNP-242 H, D-dimer 0.92 H -----------------------------------------------------------------------------------------------  04/24/20- 81 year old female former smoker (30 pkyrs) followed for Alergic Rhinitis, Asthma/COPD, Lung Nodule , history urticaria, complicated by Hx Kidney Cancer, DM2, Hyperlipidemia, GERD, history CVA, HTN, Diastolic Dysfunction Gr1, CKD 4,  -Albuterol hfa, Anoro,  Covid vax-3 Moderna                    Husband here Flu vax- had -----Sob improved, productive cough with white mucus, wheezing.  No acute event. Using rescue hfa less than 1x/ week. Anoro helps Wanted to talk about BP- has her home BP diary with occ low diastolic by wrist cuff. Discussed need to stand carefully and advised her to bring this up with her PCP, cardiology or nephrology. Discussed CT and CCXR-  small nodules.  CT chest 02/22/19- IMPRESSION: 1. Unchanged small lung nodules. No new abnormality identified in the chest. 2. Aortic Atherosclerosis (ICD10-I70.0) and Emphysema (ICD10-J43.9).  02/03/21- 81 year old female former smoker (30 pkyrs) followed for Allergic Rhinitis, Asthma/COPD, Lung Nodule , history urticaria, complicated by Recurrent Kidney Cancer, DM2, Hyperlipidemia, GERD, history CVA, HTN, Diastolic Dysfunction Gr1, CKD 4/ Peritoneal Dialysis, CAD,  -Albuterol hfa, Anoro,  Covid vax-5 Phizer                     Flu vax- had Saw NP 12/16 for cough and changed from Trelegy to [facility] / spacer for trial. Guaifenesin, Saline nasal spray.Rhinitis addressed w Singulair, flonase. -----Patient wants to talk about inhalers and would like to go back to Anoro. Patient states at night she has a lot of phlegm that comes up. Also has some shortness of breath Never tried [employer]- package insert scared her off. She has continued using Trelegy and blames it for hoarseness. She describes reflux and fluid lying down at night and has just gotten a special pillow for elevation.  We think she is describing reflux from her hiatal hernia. Flonase works well for rhinitis. CXR 01/02/21-  IMPRESSION: No active cardiopulmonary disease.  Hiatal hernia    ROS- see HPI   + = positive Constitutional:  + weight loss, night sweats, fevers, chills, fatigue, lassitude. HEENT:   No-  headaches, difficulty swallowing, tooth/dental problems, sore throat,       No-  sneezing, itching, ear ache, +nasal congestion, post nasal drip,  CV:  noncardiac chest pain, no-orthopnea, PND, swelling in lower extremities, anasarca,                dizziness, No-palpitations Resp: + shortness of breath with exertion or at rest.               +  productive cough,  No non-productive cough,  No-  coughing up of blood.               change in color of mucus.  No- wheezing.   Skin: No-   rash or lesions. GI:  No-   heartburn,  indigestion, abdominal pain, nausea, vomiting,  GU: MS:  +  joint pain or swelling.  . Neuro- nothing unusual Psych:  No- change in mood or affect. No depression or anxiety.  No memory loss.  OBJ General- Alert, Oriented, Affect-appropriate, Distress- none acute, +Obese, talkative ..  Skin- rash-none, lesions- none, excoriation- none Lymphadenopathy- none Head- atraumatic            Eyes- Gross vision intact, PERRLA, conjunctivae clear secretions            Ears- hearing intact            Nose- +turbinate edema, no-Septal dev, mucus bridging, no-polyps, erosion, perforation             Throat- Mallampati III , mucosa clear , drainage- none, tonsils- atrophic, +hoarse Neck- flexible , trachea midline, no stridor , thyroid nl, carotid no bruit Chest - symmetrical excursion , unlabored           Heart/CV- RRR , no murmur , no gallop  , no rub, nl s1 s2                           - JVD- none ,  Edema- none, stasis changes- none, varices- none           Lung- clear to P&A, wheeze-none, cough- none , dullness-none, rub- none           Chest wall-  Abd-  Br/ Gen/ Rectal- Not done, not indicated Extrem- cyanosis- none, clubbing, none, atrophy- none, strength- nl. these Neuro- grossly intact to observation

## 2021-02-03 ENCOUNTER — Other Ambulatory Visit: Payer: Self-pay

## 2021-02-03 ENCOUNTER — Encounter: Payer: Self-pay | Admitting: Internal Medicine

## 2021-02-03 ENCOUNTER — Ambulatory Visit (INDEPENDENT_AMBULATORY_CARE_PROVIDER_SITE_OTHER): Payer: Medicare HMO | Admitting: Internal Medicine

## 2021-02-03 DIAGNOSIS — J302 Other seasonal allergic rhinitis: Secondary | ICD-10-CM | POA: Diagnosis not present

## 2021-02-03 DIAGNOSIS — J3089 Other allergic rhinitis: Secondary | ICD-10-CM

## 2021-02-03 DIAGNOSIS — J449 Chronic obstructive pulmonary disease, unspecified: Secondary | ICD-10-CM | POA: Diagnosis not present

## 2021-02-03 DIAGNOSIS — C649 Malignant neoplasm of unspecified kidney, except renal pelvis: Secondary | ICD-10-CM | POA: Diagnosis not present

## 2021-02-03 MED ORDER — UMECLIDINIUM-VILANTEROL 62.5-25 MCG/ACT IN AEPB: 1.0000 | INHALATION_SPRAY | Freq: Every day | RESPIRATORY_TRACT | 12 refills | Status: DC

## 2021-02-03 NOTE — Assessment & Plan Note (Signed)
Okay to continue Flonase.

## 2021-02-03 NOTE — Patient Instructions (Signed)
Script sent for Anoro     inhale 1 puff, once daily.   This is instead of Trelegy or Breztri.  Ok to continue fluticasone(Flonase) nasal spray

## 2021-02-03 NOTE — Assessment & Plan Note (Signed)
Quite talkative and not obviously dyspneic at this visit.  Chest is very clear. Plan-return to [employer] instead of Trelegy or [employer]

## 2021-02-03 NOTE — Assessment & Plan Note (Signed)
She is deciding whether she wants to do anything about this now.

## 2021-03-11 ENCOUNTER — Other Ambulatory Visit: Payer: Self-pay

## 2021-03-11 ENCOUNTER — Ambulatory Visit (INDEPENDENT_AMBULATORY_CARE_PROVIDER_SITE_OTHER): Payer: Medicare HMO | Admitting: Gastroenterology

## 2021-03-11 DIAGNOSIS — K625 Hemorrhage of anus and rectum: Secondary | ICD-10-CM | POA: Diagnosis not present

## 2021-03-11 NOTE — Patient Instructions (Signed)
Hemorrhoids °Hemorrhoids are swollen veins that may develop: °In the butt (rectum). These are called internal hemorrhoids. °Around the opening of the butt (anus). These are called external hemorrhoids. °Hemorrhoids can cause pain, itching, or bleeding. Most of the time, they do not cause serious problems. They usually get better with diet changes, lifestyle changes, and other home treatments. °What are the causes? °This condition may be caused by: °Having trouble pooping (constipation). °Pushing hard (straining) to poop. °Watery poop (diarrhea). °Pregnancy. °Being very overweight (obese). °Sitting for long periods of time. °Heavy lifting or other activity that causes you to strain. °Anal sex. °Riding a bike for a long period of time. °What are the signs or symptoms? °Symptoms of this condition include: °Pain. °Itching or soreness in the butt. °Bleeding from the butt. °Leaking poop. °Swelling in the area. °One or more lumps around the opening of your butt. °How is this diagnosed? °A doctor can often diagnose this condition by looking at the affected area. The doctor may also: °Do an exam that involves feeling the area with a gloved hand (digital rectal exam). °Examine the area inside your butt using a small tube (anoscope). °Order blood tests. This may be done if you have lost a lot of blood. °Have you get a test that involves looking inside the colon using a flexible tube with a camera on the end (sigmoidoscopy or colonoscopy). °How is this treated? °This condition can usually be treated at home. Your doctor may tell you to change what you eat, make lifestyle changes, or try home treatments. If these do not help, procedures can be done to remove the hemorrhoids or make them smaller. These may involve: °Placing rubber bands at the base of the hemorrhoids to cut off their blood supply. °Injecting medicine into the hemorrhoids to shrink them. °Shining a type of light energy onto the hemorrhoids to cause them to fall  off. °Doing surgery to remove the hemorrhoids or cut off their blood supply. °Follow these instructions at home: °Eating and drinking ° °Eat foods that have a lot of fiber in them. These include whole grains, beans, nuts, fruits, and vegetables. °Ask your doctor about taking products that have added fiber (fibersupplements). °Reduce the amount of fat in your diet. You can do this by: °Eating low-fat dairy products. °Eating less red meat. °Avoiding processed foods. °Drink enough fluid to keep your pee (urine) pale yellow. °Managing pain and swelling ° °Take a warm-water bath (sitz bath) for 20 minutes to ease pain. Do this 3-4 times a day. You may do this in a bathtub or using a portable sitz bath that fits over the toilet. °If told, put ice on the painful area. It may be helpful to use ice between your warm baths. °Put ice in a plastic bag. °Place a towel between your skin and the bag. °Leave the ice on for 20 minutes, 2-3 times a day. °General instructions °Take over-the-counter and prescription medicines only as told by your doctor. °Medicated creams and medicines may be used as told. °Exercise often. Ask your doctor how much and what kind of exercise is best for you. °Go to the bathroom when you have the urge to poop. Do not wait. °Avoid pushing too hard when you poop. °Keep your butt dry and clean. Use wet toilet paper or moist towelettes after pooping. °Do not sit on the toilet for a long time. °Keep all follow-up visits as told by your doctor. This is important. °Contact a doctor if you: °Have pain and   swelling that do not get better with treatment or medicine. °Have trouble pooping. °Cannot poop. °Have pain or swelling outside the area of the hemorrhoids. °Get help right away if you have: °Bleeding that will not stop. °Summary °Hemorrhoids are swollen veins in the butt or around the opening of the butt. °They can cause pain, itching, or bleeding. °Eat foods that have a lot of fiber in them. These include  whole grains, beans, nuts, fruits, and vegetables. °Take a warm-water bath (sitz bath) for 20 minutes to ease pain. Do this 3-4 times a day. °This information is not intended to replace advice given to you by your health care provider. Make sure you discuss any questions you have with your health care provider. °Document Revised: 07/16/2020 Document Reviewed: 07/16/2020 °Elsevier Patient Education © 2022 [employer] Inc[employer] °How to Take a Sitz Bath °A sitz bath is a warm water bath that may be used to care for your rectum, genital area, or the area between your rectum and genitals (perineum). In a sitz bath, the water only comes up to your hips and covers your buttocks. A sitz bath may be done in a bathtub or with a portable sitz bath that fits over the toilet. °Your health care provider may recommend a sitz bath to help: °Relieve pain and discomfort after delivering a baby. °Relieve pain and itching from hemorrhoids or anal fissures. °Relieve pain after certain surgeries. °Relax muscles that are sore or tight. °How to take a sitz bath °Take 3-4 sitz baths a day, or as many as told by your health care provider. °Bathtub sitz bath °To take a sitz bath in a bathtub: °Partially fill a bathtub with warm water. The water should be deep enough to cover your hips and buttocks when you are sitting in the tub. °Follow your health care provider's instructions if you are told to put medicine in the water. °Sit in the water. Open the tub drain a little, and leave it open during your bath. °Turn on the warm water again, enough to replace the water that is draining out. Keep the water running throughout your bath. This helps keep the water at the right level and temperature. °Soak in the water for 15-20 minutes, or as long as told by your health care provider. °When you are done, be careful when you stand up. You may feel dizzy. °After the sitz bath, pat yourself dry. Do not rub your skin to dry it. ° °Over-the-toilet sitz bath °To  take a sitz bath with an over-the-toilet basin: °Follow the manufacturer's instructions. °Fill the basin with warm water. °Follow your health care provider's instructions if you were told to put medicine in the water. °Sit on the seat. Make sure the water covers your buttocks and perineum. °Soak in the water for 15-20 minutes, or as long as told by your health care provider. °After the sitz bath, pat yourself dry. Do not rub your skin to dry it. °Clean and dry the basin between uses. °Discard the basin if it cracks, or according to the manufacturer's instructions. ° °Contact a health care provider if: °Your pain or itching gets worse. Do not continue with sitz baths if your symptoms get worse. °You have new symptoms. Do not continue with sitz baths until you talk with your health care provider. °Summary °A sitz bath is a warm water bath in which the water only comes up to your hips and covers your buttocks. °A sitz bath may help relieve pain and discomfort after delivering   a baby. It also may help with pain and itching from hemorrhoids or anal fissures, or pain after certain surgeries. It can also help to relax muscles that are sore or tight. °Take 3-4 sitz baths a day, or as many as told by your health care provider. Soak in the water for 15-20 minutes. °Do not continue with sitz baths if your symptoms get worse. °This information is not intended to replace advice given to you by your health care provider. Make sure you discuss any questions you have with your health care provider. °Document Revised: 09/20/2019 Document Reviewed: 09/20/2019 °Elsevier Patient Education © 2022 Elsevier Inc. °High-Fiber Eating Plan °Fiber, also called dietary fiber, is a type of carbohydrate. It is found foods such as fruits, vegetables, whole grains, and beans. A high-fiber diet can have many health benefits. Your health care provider may recommend a high-fiber diet to help: °Prevent constipation. Fiber can make your bowel movements  more regular. °Lower your cholesterol. °Relieve the following conditions: °Inflammation of veins in the anus (hemorrhoids). °Inflammation of specific areas of the digestive tract (uncomplicated diverticulosis). °A problem of the large intestine, also called the colon, that sometimes causes pain and diarrhea (irritable bowel syndrome, or IBS). °Prevent overeating as part of a weight-loss plan. °Prevent heart disease, type 2 diabetes, and certain cancers. °What are tips for following this plan? °Reading food labels ° °Check the nutrition facts label on food products for the amount of dietary fiber. Choose foods that have 5 grams of fiber or more per serving. °The goals for recommended daily fiber intake include: °Men (age 50 or younger): 34-38 g. °Men (over age 50): 28-34 g. °Women (age 50 or younger): 25-28 g. °Women (over age 50): 22-25 g. °Your daily fiber goal is _____________ g. °Shopping °Choose whole fruits and vegetables instead of processed forms, such as apple juice or applesauce. °Choose a wide variety of high-fiber foods such as avocados, lentils, oats, and kidney beans. °Read the nutrition facts label of the foods you choose. Be aware of foods with added fiber. These foods often have high sugar and sodium amounts per serving. °Cooking °Use whole-grain flour for baking and cooking. °Cook with brown rice instead of white rice. °Meal planning °Start the day with a breakfast that is high in fiber, such as a cereal that contains 5 g of fiber or more per serving. °Eat breads and cereals that are made with whole-grain flour instead of refined flour or white flour. °Eat brown rice, bulgur wheat, or millet instead of white rice. °Use beans in place of meat in soups, salads, and pasta dishes. °Be sure that half of the grains you eat each day are whole grains. °General information °You can get the recommended daily intake of dietary fiber by: °Eating a variety of fruits, vegetables, grains, nuts, and beans. °Taking  a fiber supplement if you are not able to take in enough fiber in your diet. It is better to get fiber through food than from a supplement. °Gradually increase how much fiber you consume. If you increase your intake of dietary fiber too quickly, you may have bloating, cramping, or gas. °Drink plenty of water to help you digest fiber. °Choose high-fiber snacks, such as berries, raw vegetables, nuts, and popcorn. °What foods should I eat? °Fruits °Berries. Pears. Apples. Oranges. Avocado. Prunes and raisins. Dried figs. °Vegetables °Sweet potatoes. Spinach. Kale. Artichokes. Cabbage. Broccoli. Cauliflower. Green peas. Carrots. Squash. °Grains °Whole-grain breads. Multigrain cereal. Oats and oatmeal. Brown rice. Barley. Bulgur wheat. Millet. Quinoa.   Bran muffins. Popcorn. Rye wafer crackers. °Meats and other proteins °Navy beans, kidney beans, and pinto beans. Soybeans. Split peas. Lentils. Nuts and seeds. °Dairy °Fiber-fortified yogurt. °Beverages °Fiber-fortified soy milk. Fiber-fortified orange juice. °Other foods °Fiber bars. °The items listed above may not be a complete list of recommended foods and beverages. Contact a dietitian for more information. °What foods should I avoid? °Fruits °Fruit juice. Cooked, strained fruit. °Vegetables °Fried potatoes. Canned vegetables. Well-cooked vegetables. °Grains °White bread. Pasta made with refined flour. White rice. °Meats and other proteins °Fatty cuts of meat. Fried chicken or fried fish. °Dairy °Milk. Yogurt. Cream cheese. Sour cream. °Fats and oils °Butters. °Beverages °Soft drinks. °Other foods °Cakes and pastries. °The items listed above may not be a complete list of foods and beverages to avoid. Talk with your dietitian about what choices are best for you. °Summary °Fiber is a type of carbohydrate. It is found in foods such as fruits, vegetables, whole grains, and beans. °A high-fiber diet has many benefits. It can help to prevent constipation, lower blood  cholesterol, aid weight loss, and reduce your risk of heart disease, diabetes, and certain cancers. °Increase your intake of fiber gradually. Increasing fiber too quickly may cause cramping, bloating, and gas. Drink plenty of water while you increase the amount of fiber you consume. °The best sources of fiber include whole fruits and vegetables, whole grains, nuts, seeds, and beans. °This information is not intended to replace advice given to you by your health care provider. Make sure you discuss any questions you have with your health care provider. °Document Revised: 05/10/2019 Document Reviewed: 05/10/2019 °Elsevier Patient Education © 2022 Elsevier Inc. ° °

## 2021-03-11 NOTE — Progress Notes (Signed)
Jonathon Bellows MD, MRCP(U.K) [address]  [place]  [place], [place] [address]  Main: [phone]  Fax: [phone]   Gastroenterology Consultation  Referring Provider:     Tracie Harrier, MD Primary Care Physician:  Tracie Harrier, MD Primary Gastroenterologist:  Dr. Jonathon Bellows  Reason for Consultation:     Rectal bleeding        HPI:   MEGAHN KILLINGS is a 81 y.o. y/o female referred for consultation & management  by Dr. Tracie Harrier, MD.    Rectal bleeding :  Onset and where was blood seen  : A few months back occurring once on 1 single occasion with blood on the tissue paper after bowel movement.  Not recurred since Frequency of bowel movements : Regular Consistency : Soft Change in shape of stool: None Pain associated with bowel movements: None Blood thinner usage: Yes Plavix NSAID's: None Prior colonoscopy : Yes as below Family history of colon cancer or polyps: None Weight loss: None  On epic, last colonoscopy was by Dr. 09/05/2015 5 polyps were taken of the sigmoid transverse and ascending colon.  Internal hemorrhoids were found.There were all adenomas. She admitted in July 2021 to the hospital for a GI bleed anemia constipation abdominal pain.  A colonoscopy on 07/26/2019 and found to have diverticulosis of colon.  Patient was on Plavix at that time     02/27/2021 hemoglobin 10.6 g and MCV of 97 creatinine of 4.7.  Past Medical History:  Diagnosis Date   Adenomatous colon polyp    Allergic rhinitis    Asthma    Cancer (HCC)    kidney   COPD (chronic obstructive pulmonary disease) (HCC)    Diabetes mellitus type II    Diverticulosis of colon    Esophageal stricture    Fatty liver    Gastritis    GERD (gastroesophageal reflux disease)    Gout    History of chickenpox    History of CVA (cerebrovascular accident) 2004   Hyperlipidemia    Hypertension    Internal hemorrhoids    Kidney tumor 11/2012   Loss of smell 2004   due to CVA    Periumbilical hernia    Renal insufficiency    Umbilical hernia     Past Surgical History:  Procedure Laterality Date   ABDOMINAL HYSTERECTOMY     ABDOMINAL HYSTERECTOMY     APPENDECTOMY     CAPD INSERTION N/A 01/31/2019   Procedure: LAPAROSCOPIC INSERTION CONTINUOUS AMBULATORY PERITONEAL DIALYSIS  (CAPD) CATHETER;  Surgeon: Algernon Huxley, MD;  Location: [facility] ORS;  Service: General;  Laterality: N/A;   CESAREAN SECTION     x 2   COLONOSCOPY WITH PROPOFOL N/A 07/23/2015   Procedure: COLONOSCOPY WITH PROPOFOL;  Surgeon: Manya Silvas, MD;  Location: [facility] ENDOSCOPY;  Service: Endoscopy;  Laterality: N/A;   ESOPHAGOGASTRODUODENOSCOPY (EGD) WITH PROPOFOL N/A 07/23/2015   Procedure: ESOPHAGOGASTRODUODENOSCOPY (EGD) WITH PROPOFOL;  Surgeon: Manya Silvas, MD;  Location[facility] ENDOSCOPY;  Service: Endoscopy;  Laterality: N/A;   HEMORROIDECTOMY     KIDNEY SURGERY      Prior to Admission medications   Medication Sig Start Date End Date Taking? Authorizing Provider  acetaminophen (TYLENOL) 325 MG tablet Take 2 tablets (650 mg total) by mouth every 6 (six) hours as needed for mild pain (or Fever >/= 101). 02/01/19   Thornell Mule, MD  albuterol (PROVENTIL HFA;VENTOLIN HFA) 108 (90 Base) MCG/ACT inhaler 2 puffs every 6 hours as needed for wheeze/  cough 05/28/15   Young, Tarri Fuller D, MD  allopurinol (ZYLOPRIM) 100 MG tablet Take 100 mg by mouth daily. 06/26/18   [provider]  atorvastatin (LIPITOR) 40 MG tablet Take 40 mg by mouth daily.    [provider]  calcitRIOL (ROCALTROL) 0.25 MCG capsule TAKE 1 CAPSULE BY MOUTH THREE TIMES A WEEK 06/29/17   [provider]  ergocalciferol (VITAMIN D2) 50000 UNITS capsule Take 50,000 Units by mouth every 30 (thirty) days.    [provider]  fluticasone (FLONASE) 50 MCG/ACT nasal spray Place 1 spray into both nostrils daily. 01/02/21   Clayton Bibles, NP  HYDROcodone-acetaminophen (NORCO/VICODIN) 5-325 MG tablet  12/17/19    [provider]  hyoscyamine (LEVSIN SL) 0.125 MG SL tablet Place 0.125 mg under the tongue every 6 (six) hours as needed.  06/24/17 01/28/19  [provider]  insulin aspart (NOVOLOG) 100 UNIT/ML injection Inject 6 Units into the skin daily before supper. Pens Patient taking differently: Inject 6 Units into the skin in the morning, at noon, in the evening, and at bedtime. Sliding scale 8 units am 8 units lunch 12 units at dinner 6 units at HS 12/05/15   Philemon Kingdom, MD  insulin degludec (TRESIBA) 200 UNIT/ML FlexTouch Pen Inject 36 Units into the skin daily. 05/05/17   [provider]  multivitamin (RENA-VIT) TABS tablet Take 1 tablet by mouth daily. 07/16/19   [provider]  mupirocin cream (BACTROBAN) 2 %  11/14/19   [provider]  nitroGLYCERIN (NITROSTAT) 0.4 MG SL tablet Place 0.4 mg under the tongue every 5 (five) minutes as needed for chest pain.    [provider]  ondansetron (ZOFRAN) 4 MG tablet Take 1 tablet (4 mg total) by mouth every 6 (six) hours as needed for nausea. 02/01/19   Thornell Mule, MD  pantoprazole (PROTONIX) 40 MG tablet Take 40 mg by mouth daily.    [provider]  PLAVIX 75 MG tablet TAKE 1 TABLET BY MOUTH ONCE A DAY 07/28/12   Janith Lima, MD  potassium chloride (MICRO-K) 10 MEQ CR capsule Take by mouth.    [provider]  pregabalin (LYRICA) 50 MG capsule Take 1 capsule (50 mg total) by mouth as needed. Patient taking differently: Take 50 mg by mouth daily. 10/24/12   Janith Lima, MD  sevelamer carbonate (RENVELA) 800 MG tablet Take 1,600 mg by mouth 2 (two) times daily. 04/14/20   [provider]  Spacer/Aero-Holding Dorise Bullion Use with Judithann Sauger. 01/02/21   Clayton Bibles, NP  torsemide (DEMADEX) 100 MG tablet  08/17/19   [provider]  umeclidinium-vilanterol (ANORO ELLIPTA) 62.5-25 MCG/ACT AEPB Inhale 1 puff into the lungs daily. 02/03/21   Deneise Lever, MD  zolpidem (AMBIEN) 5 MG tablet TAKE 1 TABLET BY MOUTH AT BEDTIME AS NEEDED 07/20/12   Janith Lima, MD    Family History  Problem Relation Age of Onset   GER disease Father    Heart failure Father    Colon polyps Father    Ovarian cancer Mother    Colon cancer Other        aunt   Diabetes Other    Colon polyps Other        aunt   CAD Neg Hx      Social History   Tobacco Use   Smoking status: Former    Packs/day: 1.00    Years: 30.00    Pack years: 30.00  Types: Cigarettes    Quit date: 01/18/2002    Years since quitting: 19.1   Smokeless tobacco: Never  Vaping Use   Vaping Use: Never used  Substance Use Topics   Alcohol use: No   Drug use: No    Allergies as of 03/11/2021 - Review Complete 02/03/2021  Allergen Reaction Noted   Crestor [rosuvastatin]  06/18/2012   Iodinated contrast media Other (See Comments) 04/17/2012   Pioglitazone  12/24/2009   Sulfa antibiotics Hives 07/31/2010   Sulfonamide derivatives     Losartan Swelling 08/25/2015   Sulfasalazine Hives 11/04/2016    Review of Systems:    All systems reviewed and negative except where noted in HPI.   Physical Exam:  There were no vitals taken for this visit. No LMP recorded. Patient has had a hysterectomy. Psych:  Alert and cooperative. Normal mood and affect. General:   Alert,  Well-developed, well-nourished, pleasant and cooperative in NAD Head:  Normocephalic and atraumatic. Eyes:  Sclera clear, no icterus.   Conjunctiva pink. Ears:  Normal auditory acuity. Neurologic:  Alert and oriented x3;  grossly normal neurologically. Psych:  Alert and cooperative. Normal mood and affect.  Imaging Studies: No results found.  Assessment and Plan:   BILLEE BALCERZAK is a 81 y.o. y/o female has been referred for rectal bleeding.  Prior history of colonoscopy in 2017 and 2021 where she was found to have diverticulosis of colon and internal hemorrhoids.  She has been on Plavix.  Rectal bleeding has  been on a single occasion I discussed with her the differentials which would highly likely to be internal hemorrhoids.  Explained to her that we have the option of wait and watch and see if this issue recurs then we would need to do a colonoscopy otherwise requested proceed with a colonoscopy at this point of time.  I explained to her that with her comorbidities her relative risk for anesthesia would be higher than average.  I gave her all the options and at this point of time she decided to proceed with conservative management and empirically manage her hemorrhoids if the patient has recurrent bleeding she will call my office right away to be scheduled for a colonoscopy.  We discussed about a high-fiber diet with an aim of 25 g of fiber per day patient information was provided.  Discussed about sitz bath and perianal toileting and hygiene. Follow up in as needed  Dr Jonathon Bellows MD,MRCP(U.K)

## 2021-03-16 ENCOUNTER — Other Ambulatory Visit: Payer: Self-pay | Admitting: Nurse Practitioner

## 2021-03-16 DIAGNOSIS — J302 Other seasonal allergic rhinitis: Secondary | ICD-10-CM

## 2021-03-16 DIAGNOSIS — J3089 Other allergic rhinitis: Secondary | ICD-10-CM

## 2021-04-27 ENCOUNTER — Ambulatory Visit: Payer: Medicare HMO | Admitting: Internal Medicine

## 2021-08-03 NOTE — Progress Notes (Deleted)
HPI female former smoker followed for allergic rhinitis, asthma/COPD, history urticaria, complicated by , DM, GERD, history  CVA 2004.MRI brain 2015. Kidney cancer/ partial nephrectomy 2015  PFT 10/26/2011 mild obstructive airways disease, small airways, insignificant response to bronchodilator. Diffusion moderately reduced. FVC 2.46/92%, FEV1 1.76/94%, FEV1/FVC 0.71, FEF 25/75% 1.13/52%. TLC 86%, DLCO 46%. 6 MWT-99%, 97%, 99%, 297.5 m with complaint of right calf pain but no oxygen limitation V/Q scan 12/29/17- No appreciable ventilation or perfusion defects. Very low probability of pulmonary embolus. CXR 12/29/17- No acute disease. US venous ultrasound-12/27/17- 1. No femoropopliteal and no calf DVT in the visualized calf veins. If clinical symptoms are inconsistent or if there are persistent or worsening symptoms, further imaging (possibly involving the iliac veins) may be warranted. 2. Small left Baker's cyst. ECHOcardiogram-12/30/2017-EF 50-93%, grade 1 diastolic dysfunction, restricted mobility aortic valve, mild mitral regurgitation.  No pulmonary hypertension. Lab 12/26/2017-BUN 38, creatinine 1.90, BNP-242 H, D-dimer 0.92 H -----------------------------------------------------------------------------------------------  02/03/21- 81 year old female former smoker (30 pkyrs) followed for Allergic Rhinitis, Asthma/COPD, Lung Nodule , history urticaria, complicated by Recurrent Kidney Cancer, DM2, Hyperlipidemia, GERD, history CVA, HTN, Diastolic Dysfunction Gr1, CKD 4/ Peritoneal Dialysis, CAD,  -Albuterol hfa, Anoro,  Covid vax-5 Phizer                     Flu vax- had Saw NP 12/16 for cough and changed from Trelegy to [facility] / spacer for trial. Guaifenesin, Saline nasal spray.Rhinitis addressed w Singulair, flonase. -----Patient wants to talk about inhalers and would like to go back to Anoro. Patient states at night she has a lot of phlegm that comes up. Also has some shortness of  breath Never tried [employer]- package insert scared her off. She has continued using Trelegy and blames it for hoarseness. She describes reflux and fluid lying down at night and has just gotten a special pillow for elevation.  We think she is describing reflux from her hiatal hernia. Flonase works well for rhinitis. CXR 01/02/21-  IMPRESSION: No active cardiopulmonary disease.  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 HEENT:   No-  headaches, difficulty swallowing, tooth/dental problems, sore throat,       No-  sneezing, itching, ear ache, +nasal congestion, post nasal drip,  CV:  noncardiac chest pain, no-orthopnea, PND, swelling in lower extremities, anasarca,                dizziness, No-palpitations Resp: + shortness of breath with exertion or at rest.               + productive cough,  No non-productive cough,  No-  coughing up of blood.               change in color of mucus.  No- wheezing.   Skin: No-   rash or lesions. GI:  No-   heartburn, indigestion, abdominal pain, nausea, vomiting,  GU: MS:  +  joint pain or swelling.  . Neuro- nothing unusual Psych:  No- change in mood or affect. No depression or anxiety.  No memory loss.  OBJ General- Alert, Oriented, Affect-appropriate, Distress- none acute, +  Obese, talkative ..  Skin- rash-none, lesions- none, excoriation- none Lymphadenopathy- none Head- atraumatic            Eyes- Gross vision intact, PERRLA, conjunctivae clear secretions            Ears- hearing intact            Nose- +turbinate edema, no-Septal dev, mucus  bridging, no-polyps, erosion, perforation             Throat- Mallampati III , mucosa clear , drainage- none, tonsils- atrophic, +hoarse Neck- flexible , trachea midline, no stridor , thyroid nl, carotid no bruit Chest - symmetrical excursion , unlabored           Heart/CV- RRR , no murmur , no gallop  , no rub, nl s1 s2                           - JVD- none ,  Edema- none, stasis changes- none, varices- none           Lung- clear to P&A, wheeze-none, cough- none , dullness-none, rub- none           Chest wall-  Abd-  Br/ Gen/ Rectal- Not done, not indicated Extrem- cyanosis- none, clubbing, none, atrophy- none, strength- nl. these Neuro- grossly intact to observation

## 2021-08-04 ENCOUNTER — Ambulatory Visit: Payer: Medicare HMO | Admitting: Internal Medicine

## 2021-09-09 ENCOUNTER — Emergency Department (HOSPITAL_BASED_OUTPATIENT_CLINIC_OR_DEPARTMENT_OTHER)
Admission: EM | Admit: 2021-09-09 | Discharge: 2021-09-09 | Disposition: A | Discharge status: Home / Self Care | Payer: Medicare HMO | Attending: Emergency Medicine | Admitting: Emergency Medicine

## 2021-09-09 ENCOUNTER — Other Ambulatory Visit: Payer: Self-pay

## 2021-09-09 ENCOUNTER — Encounter (HOSPITAL_BASED_OUTPATIENT_CLINIC_OR_DEPARTMENT_OTHER): Payer: Self-pay

## 2021-09-09 DIAGNOSIS — Z79899 Other long term (current) drug therapy: Secondary | ICD-10-CM | POA: Insufficient documentation

## 2021-09-09 DIAGNOSIS — Z992 Dependence on renal dialysis: Secondary | ICD-10-CM

## 2021-09-09 DIAGNOSIS — N186 End stage renal disease: Secondary | ICD-10-CM | POA: Insufficient documentation

## 2021-09-09 DIAGNOSIS — Z20822 Contact with and (suspected) exposure to covid-19: Secondary | ICD-10-CM | POA: Diagnosis not present

## 2021-09-09 DIAGNOSIS — Z7902 Long term (current) use of antithrombotics/antiplatelets: Secondary | ICD-10-CM | POA: Insufficient documentation

## 2021-09-09 DIAGNOSIS — Z4902 Encounter for fitting and adjustment of peritoneal dialysis catheter: Secondary | ICD-10-CM | POA: Diagnosis not present

## 2021-09-09 DIAGNOSIS — E1122 Type 2 diabetes mellitus with diabetic chronic kidney disease: Secondary | ICD-10-CM | POA: Insufficient documentation

## 2021-09-09 DIAGNOSIS — I120 Hypertensive chronic kidney disease with stage 5 chronic kidney disease or end stage renal disease: Secondary | ICD-10-CM | POA: Diagnosis not present

## 2021-09-09 DIAGNOSIS — J449 Chronic obstructive pulmonary disease, unspecified: Secondary | ICD-10-CM | POA: Diagnosis not present

## 2021-09-09 DIAGNOSIS — Z794 Long term (current) use of insulin: Secondary | ICD-10-CM | POA: Diagnosis not present

## 2021-09-09 DIAGNOSIS — K529 Noninfective gastroenteritis and colitis, unspecified: Secondary | ICD-10-CM | POA: Insufficient documentation

## 2021-09-09 DIAGNOSIS — Z7951 Long term (current) use of inhaled steroids: Secondary | ICD-10-CM | POA: Diagnosis not present

## 2021-09-09 DIAGNOSIS — R112 Nausea with vomiting, unspecified: Secondary | ICD-10-CM | POA: Diagnosis present

## 2021-09-09 LAB — LIPASE, BLOOD: Lipase: <10 U/L — ABNORMAL LOW (ref 11–51)

## 2021-09-09 LAB — COMPREHENSIVE METABOLIC PANEL
ALT: 20 U/L (ref 0–44)
AST: 22 U/L (ref 15–41)
Albumin: 3.8 g/dL (ref 3.5–5.0)
Alkaline Phosphatase: 53 U/L (ref 38–126)
Anion gap: 16 — ABNORMAL HIGH (ref 5–15)
BUN: 40 mg/dL — ABNORMAL HIGH (ref 8–23)
CO2: 25 mmol/L (ref 22–32)
Calcium: 7.9 mg/dL — ABNORMAL LOW (ref 8.9–10.3)
Chloride: 93 mmol/L — ABNORMAL LOW (ref 98–111)
Creatinine, Ser: 8.63 mg/dL — ABNORMAL HIGH (ref 0.44–1.00)
GFR, Estimated: 4 mL/min — ABNORMAL LOW (ref >60)
Glucose, Bld: 182 mg/dL — ABNORMAL HIGH (ref 70–99)
Potassium: 4.2 mmol/L (ref 3.5–5.1)
Sodium: 134 mmol/L — ABNORMAL LOW (ref 135–145)
Total Bilirubin: 0.6 mg/dL (ref 0.3–1.2)
Total Protein: 7.4 g/dL (ref 6.5–8.1)

## 2021-09-09 LAB — CBC
HCT: 31.8 % — ABNORMAL LOW (ref 36.0–46.0)
Hemoglobin: 10.7 g/dL — ABNORMAL LOW (ref 12.0–15.0)
MCH: 32 pg (ref 26.0–34.0)
MCHC: 33.6 g/dL (ref 30.0–36.0)
MCV: 95.2 fL (ref 80.0–100.0)
Platelets: 213 10*3/uL (ref 150–400)
RBC: 3.34 MIL/uL — ABNORMAL LOW (ref 3.87–5.11)
RDW: 14.2 % (ref 11.5–15.5)
WBC: 8.7 10*3/uL (ref 4.0–10.5)
nRBC: 0.2 % (ref 0.0–0.2)

## 2021-09-09 LAB — RESP PANEL BY RT-PCR (FLU A&B, COVID) ARPGX2
Influenza A by PCR: NEGATIVE
Influenza B by PCR: NEGATIVE
SARS Coronavirus 2 by RT PCR: NEGATIVE

## 2021-09-09 MED ORDER — SODIUM CHLORIDE 0.9 % IV BOLUS
1000.0000 mL | Freq: Once | INTRAVENOUS | Status: AC
  Administered 2021-09-09: 1000 mL via INTRAVENOUS

## 2021-09-09 MED ORDER — ONDANSETRON 4 MG PO TBDP: 4.0000 mg | ORAL_TABLET | Freq: Three times a day (TID) | ORAL | 0 refills | Status: DC | PRN

## 2021-09-09 NOTE — ED Notes (Signed)
Pt is in room with en suite bathroom.  Left side rail on side where visitor is sitting down to allow pt to get up and access toilet as she reports frequent sudden urge to use bathroom.  Pt positioned for comfort.  Hat placed in toilet to catch urine, pt advised that urine sample is needed.  Pt reports that she has had n/v and frequent diarrhea since Monday.

## 2021-09-09 NOTE — ED Provider Notes (Signed)
[facility] EMERGENC[facility] DEPT Provider Note   CSN: [id number] Arrival date & time: 09/09/21  9702     History  Chief Complaint  Patient presents with   Diarrhea    Megan [name] is a 81 y.o. female.   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  The patient states that symptoms have ongoing for the past 3 days.  She only has been unable to tolerate oral intake.  She denies any blood or bile in her emesis.  She denies any blood in her stool.  She states that she has had multiple episodes of watery diarrhea daily.  She denies any severe abdominal pain.  Denies any chest pain, shortness of breath.  She did have a low-grade temperature earlier today but denies any overt fevers at home.  Home Medications Prior to Admission medications   Medication Sig Start Date End Date Taking? Authorizing Provider  ondansetron (ZOFRAN-ODT) 4 MG disintegrating tablet Take 1 tablet (4 mg total) by mouth every 8 (eight) hours as needed. 09/09/21  Yes Regan Lemming, MD  acetaminophen (TYLENOL) 325 MG tablet Take 2 tablets (650 mg total) by mouth every 6 (six) hours as needed for mild pain (or Fever >/= 101). 02/01/19   Thornell Mule, MD  albuterol (PROVENTIL HFA;VENTOLIN HFA) 108 (90 Base) MCG/ACT inhaler 2 puffs every 6 hours as needed for wheeze/ cough 05/28/15   Young, Tarri Fuller D, MD  allopurinol (ZYLOPRIM) 100 MG tablet Take 100 mg by mouth daily. 06/26/18   [provider]  atorvastatin (LIPITOR) 40 MG tablet Take 40 mg by mouth daily.    [provider]  calcitRIOL (ROCALTROL) 0.25 MCG capsule TAKE 1 CAPSULE BY MOUTH THREE TIMES A WEEK 06/29/17    [provider]  cinacalcet (SENSIPAR) 30 MG tablet Take 30 mg by mouth daily. 06/23/21   [provider]  ergocalciferol (VITAMIN D2) 50000 UNITS capsule Take 50,000 Units by mouth every 30 (thirty) days.    [provider]  fluticasone (FLONASE) 50 MCG/ACT nasal spray SPRAY 1 SPRAY INTO BOTH NOSTRILS DAILY. 03/16/21   Cobb, Karie Schwalbe, NP  gabapentin (NEURONTIN) 100 MG capsule Take 100 mg by mouth 2 (two) times daily. 09/01/21   [provider]  HYDROcodone-acetaminophen (NORCO/VICODIN) 5-325 MG tablet  12/17/19   [provider]  hyoscyamine (LEVSIN SL) 0.125 MG SL tablet Place 0.125 mg under the tongue every 6 (six) hours as needed.  06/24/17 01/28/19  [provider]  insulin aspart (NOVOLOG) 100 UNIT/ML injection Inject 6 Units into the skin daily before supper. Pens Patient taking differently: Inject 6 Units into the skin in the morning, at noon, in the evening, and at bedtime. Sliding scale 8 units am 8 units lunch 12 units at dinner 6 units at HS 12/05/15   Philemon Kingdom, MD  insulin degludec (TRESIBA) 200 UNIT/ML FlexTouch Pen Inject 36 Units into the skin daily. 05/05/17   [provider]  metoprolol tartrate (LOPRESSOR) 25 MG tablet Take 12.5 mg by mouth daily. 08/11/21   [provider]  montelukast (SINGULAIR) 10 MG tablet Take 10 mg by mouth at bedtime. 07/19/21   [provider]  multivitamin (RENA-VIT) TABS tablet Take 1 tablet by mouth  daily. 07/16/19   [provider]  mupirocin cream (BACTROBAN) 2 %  11/14/19   [provider]  nitroGLYCERIN (NITROSTAT) 0.4 MG SL tablet Place 0.4 mg under the tongue every 5 (five) minutes as needed for chest pain.    [provider]  [facility] VERIO test strip 1 each 3 (three) times daily. 09/08/21   [provider]  pantoprazole (PROTONIX) 40 MG tablet Take 40 mg by mouth daily.    [provider]  PLAVIX 75 MG tablet TAKE 1  TABLET BY MOUTH ONCE A DAY 07/28/12   Janith Lima, MD  potassium chloride (MICRO-K) 10 MEQ CR capsule Take by mouth.    [provider]  pregabalin (LYRICA) 50 MG capsule Take 1 capsule (50 mg total) by mouth as needed. Patient taking differently: Take 50 mg by mouth daily. 10/24/12   Janith Lima, MD  pregabalin (LYRICA) 75 MG capsule Take 75 mg by mouth daily. 09/09/21   [provider]  sevelamer carbonate (RENVELA) 800 MG tablet Take 1,600 mg by mouth 2 (two) times daily. 04/14/20   [provider]  Spacer/Aero-Holding Dorise Bullion Use with Judithann Sauger. 01/02/21   Cobb, Karie Schwalbe, NP  spironolactone (ALDACTONE) 25 MG tablet Take 25 mg by mouth daily. 07/12/21   [provider]  torsemide (DEMADEX) 100 MG tablet  08/17/19   [provider]  umeclidinium-vilanterol (ANORO ELLIPTA) 62.5-25 MCG/ACT AEPB Inhale 1 puff into the lungs daily. 02/03/21   Deneise Lever, MD  zolpidem (AMBIEN) 5 MG tablet TAKE 1 TABLET BY MOUTH AT BEDTIME AS NEEDED 07/20/12   Janith Lima, MD      Allergies    Crestor [rosuvastatin], Iodinated contrast media, Pioglitazone, Sulfa antibiotics, Sulfonamide derivatives, Losartan, and Sulfasalazine    Review of Systems   Review of Systems  Gastrointestinal:  Positive for diarrhea, nausea and vomiting.  All other systems reviewed and are negative.   Physical Exam Updated Vital Signs BP 124/63   Pulse 77   Temp 98.2 F (36.8 C) (Oral)   Resp 16   Ht '5\' 3"'$  (1.6 m)   Wt 96 kg   SpO2 99%   BMI 37.49 kg/m  Physical Exam Vitals and nursing note reviewed.  Constitutional:      General: She is not in acute distress.    Appearance: She is well-developed.  HENT:     Head: Normocephalic and atraumatic.     Mouth/Throat:     Mouth: Mucous membranes are dry.  Eyes:     Conjunctiva/sclera: Conjunctivae normal.  Cardiovascular:     Rate and Rhythm: Normal rate and regular rhythm.  Pulmonary:     Effort: Pulmonary  effort is normal. No respiratory distress.     Breath sounds: Normal breath sounds.  Abdominal:     Palpations: Abdomen is soft.     Tenderness: There is no abdominal tenderness.     Comments: Peritoneal dialysis catheter in place, no surrounding erythema, abdomen is soft with no tenderness to palpation, no rebound or guarding  Musculoskeletal:        General: No swelling.     Cervical back: Neck supple.  Skin:    General: Skin is warm and dry.     Capillary Refill: Capillary refill takes less than 2 seconds.  Neurological:     Mental Status: She is alert.  Psychiatric:        Mood and Affect: Mood normal.     ED Results / Procedures /  Treatments   Labs (all labs ordered are listed, but only abnormal results are displayed) Labs Reviewed  LIPASE, BLOOD - Abnormal; Notable for the following components:      Result Value   Lipase <10 (*)    All other components within normal limits  COMPREHENSIVE METABOLIC PANEL - Abnormal; Notable for the following components:   Sodium 134 (*)    Chloride 93 (*)    Glucose, Bld 182 (*)    BUN 40 (*)    Creatinine, Ser 8.63 (*)    Calcium 7.9 (*)    GFR, Estimated 4 (*)    Anion gap 16 (*)    All other components within normal limits  CBC - Abnormal; Notable for the following components:   RBC 3.34 (*)    Hemoglobin 10.7 (*)    HCT 31.8 (*)    All other components within normal limits  RESP PANEL BY RT-PCR (FLU A&B, COVID) ARPGX2  URINALYSIS, ROUTINE W REFLEX MICROSCOPIC    EKG None  Radiology No results found.  Procedures Procedures    Medications Ordered in ED Medications  sodium chloride 0.9 % bolus 1,000 mL (1,000 mLs Intravenous New Bag/Given 09/09/21 1736)    ED Course/ Medical Decision Making/ A&P                           Medical Decision Making Amount and/or Complexity of Data Reviewed Labs: ordered.    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  The patient states that symptoms have ongoing for the past 3 days.  She only has been unable to tolerate oral intake.  She denies any blood or bile in her emesis.  She denies any blood in her stool.  She states that she has had multiple episodes of watery diarrhea daily.  She denies any severe abdominal pain.  Denies any chest pain, shortness of breath.  She did have a low-grade temperature earlier today but denies any overt fevers at home.  Arrival, the patient was vitally stable, afebrile, temperature 97.9, not tachycardic or tachypneic, initial soft blood pressures 98/55, subsequently improved to 124/60 5:03 100 cc IV fluid bolus.  Sinus rhythm noted on cardiac telemetry.  Patient physical exam significant for an abdomen that was soft, nontender, nondistended, no rebound or guarding.  Peritoneal dialysis catheter in place with no surrounding erythema.  Low concern for peritonitis at this time.  Symptoms are consistent with likely gastroenteritis.  Patient not endorsing nausea at this time but having difficulty with oral intake at home.  Some concern for dehydration in the setting of a gastroenteritis type presentation.  COVID-19 influenza PCR testing was collected and resulted negative, lipase was normal, CBC without a leukocytosis, stable anemia to 10.7, CMP with no significant electrolyte abnormality, potassium normal at 4.2, blood glucose was elevated to 182 with no acidosis with a bicarb of 25, BUN was mildly elevated to 4 0 and creatinine was elevated to 8.63 but the patient is currently on peritoneal dialysis.  She does state that she still makes urine.  She denies any urinary symptoms.  The patient was administered a total of 500 cc of IV fluids and felt symptomatically  improved.  She was p.o. challenged and  successfully tolerated oral intake.  No immediate concern for need for inpatient hospitalization at this time.  Based on history and physical exam, symptoms are consistent with likely gastroenteritis.  Advised patient to continue her peritoneal dialysis outpatient, advised Zofran and continued oral rehydration at home.  Return precautions provided.  Final Clinical Impression(s) / ED Diagnoses Final diagnoses:  Gastroenteritis  Peritoneal dialysis catheter in place [facility])    Rx / DC Orders ED Discharge Orders          Ordered    ondansetron (ZOFRAN-ODT) 4 MG disintegrating tablet  Every 8 hours PRN        09/09/21 1824              Regan Lemming, MD 09/09/21 1825

## 2021-09-09 NOTE — ED Notes (Signed)
Pt was given 500cc NS vs 1L per verbal order Dr. Armandina Gemma

## 2021-09-09 NOTE — ED Triage Notes (Signed)
Patient here POV from Home with Family.  Endorses N/V/D for approximately 2-3 Days. Associated with ABD Cramping.   History of CKD with PD for approximately 2 years.   NAD Noted during Triage. A&Ox4. GCS 15. BIB Wheelchair.

## 2021-10-20 ENCOUNTER — Other Ambulatory Visit: Payer: Self-pay | Admitting: Nurse Practitioner

## 2021-10-20 DIAGNOSIS — J3089 Other allergic rhinitis: Secondary | ICD-10-CM

## 2021-10-20 DIAGNOSIS — J449 Chronic obstructive pulmonary disease, unspecified: Secondary | ICD-10-CM

## 2021-12-08 ENCOUNTER — Other Ambulatory Visit: Payer: Self-pay | Admitting: Internal Medicine

## 2021-12-09 NOTE — Telephone Encounter (Signed)
Change in therapy. 

## 2022-01-21 ENCOUNTER — Other Ambulatory Visit: Payer: Self-pay | Admitting: Nephrology

## 2022-01-21 DIAGNOSIS — N186 End stage renal disease: Secondary | ICD-10-CM

## 2022-01-29 ENCOUNTER — Ambulatory Visit
Admission: RE | Admit: 2022-01-29 | Discharge: 2022-01-29 | Disposition: A | Discharge status: Home / Self Care | Payer: Medicare Other | Source: Ambulatory Visit | Attending: Nephrology | Admitting: Nephrology

## 2022-01-29 ENCOUNTER — Ambulatory Visit
Admission: RE | Admit: 2022-01-29 | Discharge: 2022-01-29 | Disposition: A | Discharge status: Home / Self Care | Payer: Medicare Other | Attending: Nephrology | Admitting: Nephrology

## 2022-01-29 DIAGNOSIS — N186 End stage renal disease: Secondary | ICD-10-CM | POA: Insufficient documentation

## 2022-01-29 DIAGNOSIS — Z992 Dependence on renal dialysis: Secondary | ICD-10-CM | POA: Diagnosis present

## 2022-02-03 ENCOUNTER — Ambulatory Visit (INDEPENDENT_AMBULATORY_CARE_PROVIDER_SITE_OTHER): Payer: Medicare Other | Admitting: Urology

## 2022-02-03 ENCOUNTER — Encounter: Payer: Self-pay | Admitting: Urology

## 2022-02-03 VITALS — BP 125/71 | HR 87 | Ht 63.0 in | Wt 215.0 lb

## 2022-02-03 DIAGNOSIS — R350 Frequency of micturition: Secondary | ICD-10-CM

## 2022-02-03 DIAGNOSIS — R300 Dysuria: Secondary | ICD-10-CM

## 2022-02-03 LAB — BLADDER SCAN AMB NON-IMAGING

## 2022-02-03 NOTE — Progress Notes (Signed)
I, DeAsia L Maxie,acting as a scribe for Hollice Espy, MD.,have documented all relevant documentation on the behalf of Hollice Espy, MD,as directed by  Hollice Espy, MD while in the presence of Hollice Espy, MD.   I, Gery Pray Plume,acting as a scribe for Hollice Espy, MD.,have documented all relevant documentation on the behalf of Hollice Espy, MD,as directed by  Hollice Espy, MD while in the presence of Hollice Espy, MD.   02/03/2022 4:53 PM   [name] [birth year] [id number]  Referring provider: Murlean Iba, MD [address] [place],  [place] [address]  Chief Complaint  Patient presents with   Acute Visit   Urinary Tract Infection   HPI: 82 year-old female who presents today for further evaluation of urinary issues. She was previously seen by Dr. Bernardo Heater for possible renal mass along with second opinion at [facility] who all agree with surveillance.  She has been on peritoneal dialysis since 2019.   Today she complains of painful urination and blood on toilet tissue with wiping which has been ongoing for a few weeks now. She urinates 2-3x daily. She is currently on Cipro with no relief which was given by Dr. Candiss Norse. She denies any fevers or chills.   Results for orders placed or performed in visit on 02/03/22  Bladder Scan (Post Void Residual) in office  Result Value Ref Range   Scan Result 84m    PMH: Past Medical History:  Diagnosis Date   Adenomatous colon polyp    Allergic rhinitis    Asthma    [place]    [place]   COPD (chronic obstructive pulmonary disease) (HCC)    Diabetes mellitus type II    Diverticulosis of colon    Esophageal stricture    Fatty liver    Gastritis    GERD (gastroesophageal reflux disease)    Gout    History of chickenpox    History of CVA (cerebrovascular accident) 2004   Hyperlipidemia    Hypertension    Internal hemorrhoids    Kidney tumor 11/2012   Loss of smell 2004   due to CVA   Periumbilical  hernia    Renal insufficiency    Umbilical hernia     Surgical History: Past Surgical History:  Procedure Laterality Date   ABDOMINAL HYSTERECTOMY     ABDOMINAL HYSTERECTOMY     APPENDECTOMY     CAPD INSERTION N/A 01/31/2019   Procedure: LAPAROSCOPIC INSERTION CONTINUOUS AMBULATORY PERITONEAL DIALYSIS  (CAPD) CATHETER;  Surgeon: DAlgernon Huxley MD;  Location: [facility] ORS;  Service: General;  Laterality: N/A;   CESAREAN SECTION     x 2   COLONOSCOPY WITH PROPOFOL N/A 07/23/2015   Procedure: COLONOSCOPY WITH PROPOFOL;  Surgeon: RManya Silvas MD;  [facility];  Service: Endoscopy;  Laterality: N/A;   ESOPHAGOGASTRODUODENOSCOPY (EGD) WITH PROPOFOL N/A 07/23/2015   Procedure: ESOPHAGOGASTRODUODENOSCOPY (EGD) WITH PROPOFOL;  Surgeon: RManya Silvas MD;  [facility];  Service: Endoscopy;  Laterality: N/A;   HEMORROIDECTOMY     KIDNEY SURGERY      Home Medications:  Allergies as of 02/03/2022       Reactions   Crestor [rosuvastatin]    myopathy   Iodinated Contrast Media Other (See Comments)   Nausea and SOB per patient Nausea and SOB per patient   Pioglitazone    REACTION: weight gain   Sulfa Antibiotics Hives   REACTION: bLISTERS   Sulfonamide Derivatives    REACTION: bLISTERS   Losartan  Swelling   Sulfasalazine Hives   REACTION: bLISTERS        Medication List        Accurate as of February 03, 2022  4:53 PM. If you have any questions, ask your nurse or doctor.          STOP taking these medications    fluticasone 50 MCG/ACT nasal spray Commonly known as: FLONASE Stopped by: Hollice Espy, MD       TAKE these medications    acetaminophen 325 MG tablet Commonly known as: TYLENOL Take 2 tablets (650 mg total) by mouth every 6 (six) hours as needed for mild pain (or Fever >/= 101).   albuterol 108 (90 Base) MCG/ACT inhaler Commonly known as: VENTOLIN HFA 2 puffs every 6 hours as needed for wheeze/ cough   allopurinol 100 MG  tablet Commonly known as: ZYLOPRIM Take 100 mg by mouth daily.   atorvastatin 40 MG tablet Commonly known as: LIPITOR Take 40 mg by mouth daily.   calcitRIOL 0.25 MCG capsule Commonly known as: ROCALTROL TAKE 1 CAPSULE BY MOUTH THREE TIMES A WEEK   cinacalcet 30 MG tablet Commonly known as: SENSIPAR Take 30 mg by mouth daily.   ergocalciferol 1.25 MG (50000 UT) capsule Commonly known as: VITAMIN D2 Take 50,000 Units by mouth every 30 (thirty) days.   gabapentin 100 MG capsule Commonly known as: NEURONTIN Take 100 mg by mouth 2 (two) times daily.   HYDROcodone-acetaminophen 5-325 MG tablet Commonly known as: NORCO/VICODIN   hyoscyamine 0.125 MG SL tablet Commonly known as: LEVSIN SL Place 0.125 mg under the tongue every 6 (six) hours as needed.   insulin aspart 100 UNIT/ML injection Commonly known as: NovoLOG Inject 6 Units into the skin daily before supper. Pens What changed:  when to take this additional instructions   insulin degludec 200 UNIT/ML FlexTouch Pen Commonly known as: TRESIBA Inject 38 Units into the skin daily.   metoprolol tartrate 25 MG tablet Commonly known as: LOPRESSOR Take 12.5 mg by mouth daily.   montelukast 10 MG tablet Commonly known as: SINGULAIR TAKE 1 TABLET BY MOUTH EVERYDAY AT BEDTIME   multivitamin Tabs tablet Take 1 tablet by mouth daily.   mupirocin cream 2 % Commonly known as: BACTROBAN 2 (two) times daily.   nitroGLYCERIN 0.4 MG SL tablet Commonly known as: NITROSTAT Place 0.4 mg under the tongue every 5 (five) minutes as needed for chest pain.   ondansetron 4 MG disintegrating tablet Commonly known as: ZOFRAN-ODT Take 1 tablet (4 mg total) by mouth every 8 (eight) hours as needed.   OneTouch Verio test strip Generic drug: glucose blood 1 each 3 (three) times daily.   pantoprazole 40 MG tablet Commonly known as: PROTONIX Take 40 mg by mouth daily.   Plavix 75 MG tablet Generic drug: clopidogrel TAKE 1 TABLET  BY MOUTH ONCE A DAY   potassium chloride 10 MEQ CR capsule Commonly known as: MICRO-K Take by mouth.   pregabalin 100 MG capsule Commonly known as: LYRICA Take 100 mg by mouth daily. What changed: Another medication with the same name was removed. Continue taking this medication, and follow the directions you see here. Changed by: Hollice Espy, MD   sevelamer carbonate 800 MG tablet Commonly known as: RENVELA Take 1,600 mg by mouth 2 (two) times daily.   Spacer/Aero-[employer]olding [employer] Use with [employer].   spironolactone 25 MG tablet Commonly known as: ALDACTONE Take 25 mg by mouth daily.   torsemide 100 MG tablet Commonly known  as: DEMADEX   umeclidinium-vilanterol 62.5-25 MCG/ACT Aepb Commonly known as: ANORO ELLIPTA Inhale 1 puff into the lungs daily.   zolpidem 5 MG tablet Commonly known as: AMBIEN TAKE 1 TABLET BY MOUTH AT BEDTIME AS NEEDED        Allergies:  Allergies  Allergen Reactions   Crestor [Rosuvastatin]     myopathy   Iodinated Contrast Media Other (See Comments)    Nausea and SOB per patient Nausea and SOB per patient   Pioglitazone     REACTION: weight gain   Sulfa Antibiotics Hives    REACTION: bLISTERS   Sulfonamide Derivatives     REACTION: bLISTERS   Losartan Swelling   Sulfasalazine Hives    REACTION: bLISTERS    Family History: Family History  Problem Relation Age of Onset   GER disease Father    Heart failure Father    Colon polyps Father    Ovarian cancer Mother    Colon cancer Other        aunt   Diabetes Other    Colon polyps Other        aunt   CAD Neg Hx     Social History:  reports that she quit smoking about 20 years ago. Her smoking use included cigarettes. She has a 30.00 pack-year smoking history. She has never been exposed to tobacco smoke. She has never used smokeless tobacco. She reports that she does not drink alcohol and does not use drugs.   Physical Exam: BP 125/71   Pulse 87   Ht '5\' 3"'$  (1.6 m)    Wt 215 lb (97.5 kg)   BMI 38.09 kg/m   Constitutional:  Alert and oriented, No acute distress.  Accompanied by her daughter today. HEENT: [place] AT, moist mucus membranes.  Trachea midline, no masses. Neurologic: Grossly intact, no focal deficits, moving all 4 extremities. Psychiatric: Normal mood and affect.  Results for orders placed or performed in visit on 02/03/22  Bladder Scan (Post Void Residual) in office  Result Value Ref Range   Scan Result 53m     Assessment & Plan:    1. Dysuria/possible hematuria - This could be from an infection or external irritation based on the nature of what she is describing the bleeding with wiping.  -She is unable to urinate today in the office and additionally, does not have much in her bladder based on bladder scan.  She is currently on antibiotics.   -We'll have her return next week for catheterized urine to ensure a good sterile sample along with pelvic exam to ensure that the blood is not coming from her vagina or to rule out urethral or periurethral issues.   She and her daughter are very agreeable this plan  Return in about 1 week (around 02/10/2022) for with PA next week for pelvic exam and catheterized urine specimen.  [id number] [place] [phone]

## 2022-02-09 ENCOUNTER — Ambulatory Visit: Payer: TRICARE For Life (TFL) | Admitting: Physician Assistant

## 2022-02-25 ENCOUNTER — Ambulatory Visit (INDEPENDENT_AMBULATORY_CARE_PROVIDER_SITE_OTHER): Payer: Medicare Other | Admitting: Physician Assistant

## 2022-02-25 ENCOUNTER — Other Ambulatory Visit: Payer: Self-pay | Admitting: Internal Medicine

## 2022-02-25 ENCOUNTER — Encounter: Payer: Self-pay | Admitting: Physician Assistant

## 2022-02-25 VITALS — BP 103/60 | HR 83 | Ht 63.0 in | Wt 209.0 lb

## 2022-02-25 DIAGNOSIS — R300 Dysuria: Secondary | ICD-10-CM

## 2022-02-25 DIAGNOSIS — N362 Urethral caruncle: Secondary | ICD-10-CM | POA: Diagnosis not present

## 2022-02-25 MED ORDER — PREMARIN 0.625 MG/GM VA CREA: TOPICAL_CREAM | VAGINAL | 4 refills | Status: DC

## 2022-02-25 NOTE — Progress Notes (Signed)
In and Out Catheterization  Patient is present today for a I & O catheterization due to dysuria. Patient was cleaned and prepped in a sterile fashion with betadine . A 14FR cath was inserted no complications were noted , 72m of urine return was noted, urine was medium yellow in color. A clean urine sample was collected for ua. Bladder was drained  And catheter was removed with out difficulty.    Performed by: HMargorie JohnCMA

## 2022-02-25 NOTE — Progress Notes (Signed)
02/25/2022 2:42 PM   Megan [name] 08/06/1940 [id number]  CC: Chief Complaint  Patient presents with   Follow-up   HPI: Megan [name] is a 82 y.o. 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   Today she reports she completed Cipro for possible UTI with no change in her symptoms.  She continues to report severe pain with termination and some blood with wiping.  She is s/p abdominal hysterectomy.  In-office catheterized UA today positive for trace ketones, 3+ blood, 3+ protein, and 2+ leukocytes; urine microscopy with >30 WBCs/HPF, >30 RBCs/HPF, >10 epithelial cells/hpf, and many bacteria.   PMH: Past Medical History:  Diagnosis Date   Adenomatous colon polyp    Allergic rhinitis    Asthma    Cancer ([place])    kidney   COPD (chronic obstructive pulmonary disease) (HCC)    Diabetes mellitus type II    Diverticulosis of colon    Esophageal stricture    Fatty liver    Gastritis    GERD (gastroesophageal reflux disease)    Gout    History of chickenpox    History of CVA (cerebrovascular accident) 2004   Hyperlipidemia    Hypertension    Internal hemorrhoids    Kidney tumor 11/2012   Loss of smell 2004   due to CVA   Periumbilical hernia    Renal insufficiency    Umbilical hernia     Surgical History: Past Surgical History:  Procedure Laterality Date   ABDOMINAL HYSTERECTOMY     ABDOMINAL HYSTERECTOMY     APPENDECTOMY     CAPD INSERTION N/A 01/31/2019   Procedure: LAPAROSCOPIC INSERTION CONTINUOUS AMBULATORY PERITONEAL DIALYSIS  (CAPD) CATHETER;  Surgeon: Algernon Huxley, MD;  Location: [facility] ORS;  Service: General;  Laterality: N/A;   CESAREAN SECTION     x 2   COLONOSCOPY WITH PROPOFOL N/A  07/23/2015   Procedure: COLONOSCOPY WITH PROPOFOL;  Surgeon: Manya Silvas, MD;  Location: [facility]COPY;  Service: Endoscopy;  Laterality: N/A;   ESOPHAGOGASTRODUODENOSCOPY (EGD) WITH PROPOFOL N/A 07/23/2015   Procedure: ESOPHAGOGASTRODUODENOSCOPY (EGD) WITH PROPOFOL;  Surgeon: Manya Silvas, MD;  Location: [facility] ENDOSCOPY;  Service: Endoscopy;  Laterality: N/A;   HEMORROIDECTOMY     KIDNEY SURGERY      Home Medications:  Allergies as of 02/25/2022       Reactions   Crestor [rosuvastatin]    myopathy   Iodinated Contrast Media Other (See Comments)   Nausea and SOB per patient Nausea and SOB per patient   Pioglitazone    REACTION: weight gain   Sulfa Antibiotics Hives   REACTION: bLISTERS   Sulfonamide Derivatives    REACTION: bLISTERS   Losartan Swelling   Sulfasalazine Hives   REACTION: bLISTERS        Medication List        Accurate as of February 25, 2022  2:42 PM. If you have any questions, ask your nurse or doctor.          STOP taking these medications    gabapentin 100 MG capsule Commonly known as: NEURONTIN Stopped by: Debroah Loop, PA-C   HYDROcodone-acetaminophen 5-325 MG tablet Commonly known as: NORCO/VICODIN Stopped by: Debroah Loop, PA-C   [place] by: Aldona Bar  Chiron Campione, PA-C       TAKE these medications    acetaminophen 325 MG tablet Commonly known as: TYLENOL Take 2 tablets (650 mg total) by mouth every 6 (six) hours as needed for mild pain (or Fever >/= 101).   albuterol 108 (90 Base) MCG/ACT inhaler Commonly known as: VENTOLIN HFA 2 puffs every 6 hours as needed for wheeze/ cough   allopurinol 100 MG tablet Commonly known as: ZYLOPRIM Take 100 mg by mouth daily.   atorvastatin 40 MG tablet Commonly known as: LIPITOR Take 40 mg by mouth daily.   calcitRIOL 0.25 MCG capsule Commonly known as: ROCALTROL TAKE 1 CAPSULE BY MOUTH THREE TIMES A WEEK   cinacalcet 30 MG  tablet Commonly known as: SENSIPAR Take 30 mg by mouth daily.   ergocalciferol 1.25 MG (50000 UT) capsule Commonly known as: VITAMIN D2 Take 50,000 Units by mouth every 30 (thirty) days.   hyoscyamine 0.125 MG SL tablet Commonly known as: LEVSIN SL Place 0.125 mg under the tongue every 6 (six) hours as needed.   insulin aspart 100 UNIT/ML injection Commonly known as: NovoLOG Inject 6 Units into the skin daily before supper. Pens What changed:  when to take this additional instructions   insulin degludec 200 UNIT/ML FlexTouch Pen Commonly known as: TRESIBA Inject 38 Units into the skin daily.   metoprolol tartrate 25 MG tablet Commonly known as: LOPRESSOR Take 12.5 mg by mouth daily.   montelukast 10 MG tablet Commonly known as: SINGULAIR TAKE 1 TABLET BY MOUTH EVERYDAY AT BEDTIME   multivitamin Tabs tablet Take 1 tablet by mouth daily.   mupirocin cream 2 % Commonly known as: BACTROBAN 2 (two) times daily.   nitroGLYCERIN 0.4 MG SL tablet Commonly known as: NITROSTAT Place 0.4 mg under the tongue every 5 (five) minutes as needed for chest pain.   ondansetron 4 MG disintegrating tablet Commonly known as: ZOFRAN-ODT Take 1 tablet (4 mg total) by mouth every 8 (eight) hours as needed.   OneTouch Verio test strip Generic drug: glucose blood 1 each 3 (three) times daily.   pantoprazole 40 MG tablet Commonly known as: PROTONIX Take 40 mg by mouth daily.   Plavix 75 MG tablet Generic drug: clopidogrel TAKE 1 TABLET BY MOUTH ONCE A DAY   potassium chloride 10 MEQ CR capsule Commonly known as: MICRO-K Take by mouth.   pregabalin 100 MG capsule Commonly known as: LYRICA Take 100 mg by mouth daily.   Premarin vaginal cream Generic drug: conjugated estrogens Apply one pea-sized amount around the opening of the urethra daily for 2 weeks, then 3 times weekly moving forward. Started by: Debroah Loop, PA-C   sevelamer carbonate 800 MG tablet Commonly  known as: RENVELA Take 1,600 mg by mouth 2 (two) times daily.   spironolactone 25 MG tablet Commonly known as: ALDACTONE Take 25 mg by mouth daily.   torsemide 100 MG tablet Commonly known as: DEMADEX   umeclidinium-vilanterol 62.5-25 MCG/ACT Aepb Commonly known as: ANORO ELLIPTA Inhale 1 puff into the lungs daily.   zolpidem 5 MG tablet Commonly known as: AMBIEN TAKE 1 TABLET BY MOUTH AT BEDTIME AS NEEDED        Allergies:  Allergies  Allergen Reactions   Crestor [Rosuvastatin]     myopathy   Iodinated Contrast Media Other (See Comments)    Nausea and SOB per patient Nausea and SOB per patient   Pioglitazone     REACTION: weight gain   Sulfa Antibiotics Hives    REACTION:  bLISTERS   Sulfonamide Derivatives     REACTION: bLISTERS   Losartan Swelling   Sulfasalazine Hives    REACTION: bLISTERS    Family History: Family History  Problem Relation Age of Onset   GER disease Father    Heart failure Father    Colon polyps Father    Ovarian cancer Mother    Colon cancer Other        aunt   Diabetes Other    Colon polyps Other        aunt   CAD Neg Hx     Social History:   reports that she quit smoking about 20 years ago. Her smoking use included cigarettes. She has a 30.00 pack-year smoking history. She has never been exposed to tobacco smoke. She has never used smokeless tobacco. She reports that she does not drink alcohol and does not use drugs.  Physical Exam: BP 103/60   Pulse 83   Ht 5' 3"$  (1.6 m)   Wt 209 lb (94.8 kg)   BMI 37.02 kg/m   Constitutional:  Alert and oriented, no acute distress, nontoxic appearing HEENT: [place], AT Cardiovascular: No clubbing, cyanosis, or edema Respiratory: Normal respiratory effort, no increased work of breathing GU: Urethral caruncle, inflamed.  No evidence of vaginal bleeding Skin: No rashes, bruises or suspicious lesions Neurologic: Grossly intact, no focal deficits, moving all 4 extremities Psychiatric: Normal  mood and affect  Laboratory Data: Results for orders placed or performed in visit on 02/25/22  Microscopic Examination   Urine  Result Value Ref Range   WBC, UA >30 (A) 0 - 5 /hpf   RBC, Urine >30 (A) 0 - 2 /hpf   Epithelial Cells (non renal) >10 (A) 0 - 10 /hpf   Casts Present (A) None seen /lpf   Cast Type Hyaline casts N/A   Mucus, UA Present (A) Not Estab.   Bacteria, UA Many (A) None seen/Few  Urinalysis, Complete  Result Value Ref Range   Specific Gravity, UA 1.020 1.005 - 1.030   pH, UA 7.0 5.0 - 7.5   Color, UA Yellow Yellow   Appearance Ur Cloudy (A) Clear   Leukocytes,UA 2+ (A) Negative   Protein,UA 3+ (A) Negative/Trace   Glucose, UA Negative Negative   Ketones, UA Trace (A) Negative   RBC, UA 3+ (A) Negative   Bilirubin, UA Negative Negative   Urobilinogen, Ur 0.2 0.2 - 1.0 mg/dL   Nitrite, UA Negative Negative   Microscopic Examination See below:    Assessment & Plan:   1. Dysuria No improvement with Cipro.  Catheterized UA today is suspicious, however we discussed that this can be a normal finding in the setting of ESRD.  Will send for culture for further evaluation.  If urine culture is positive, recommend culture appropriate antibiotics with plans for repeat cath UA to prove resolution of microscopic hematuria.  If culture is negative, recommend hematuria workup given significant micro heme today, felt unlikely to be secondary to urethral caruncle alone. - Urinalysis, Complete - CULTURE, URINE COMPREHENSIVE  2. Urethral caruncle Noted on physical exam today, possible source of blood on tissue paper and pain with termination.  Recommend starting topical vaginal estrogen cream and she agreed. - conjugated estrogens (PREMARIN) vaginal cream; Apply one pea-sized amount around the opening of the urethra daily for 2 weeks, then 3 times weekly moving forward.  Dispense: 30 g; Refill: 4  Return for Will call with results.  Debroah Loop, PA-C  [facility]  [address] [place], [place] [address] [phone]

## 2022-02-25 NOTE — Patient Instructions (Signed)
Today we discussed that you have a urethral caruncle, which is a sign of lost estrogen in the tissue around the bladder and urethra during menopause. This appears to be irritated and is likely the source of the bleeding and pain you're having with urination.  To treat this, I'm prescribing topical vaginal estrogen cream. Please apply one pea-sized amount around the opening of the urethra every day for 2 weeks, then Monday, Wednesday, and Friday forever. This will replace the estrogen in the tissue around your bladder and should resolve your symptoms.

## 2022-02-26 LAB — MICROSCOPIC EXAMINATION
Epithelial Cells (non renal): >10 /hpf — AB (ref 0–10)
RBC, Urine: >30 /hpf — AB (ref 0–2)
WBC, UA: >30 /hpf — AB (ref 0–5)

## 2022-02-26 LAB — URINALYSIS, COMPLETE
Bilirubin, UA: NEGATIVE
Glucose, UA: NEGATIVE
Nitrite, UA: NEGATIVE
Specific Gravity, UA: 1.02 (ref 1.005–1.030)
Urobilinogen, Ur: 0.2 mg/dL (ref 0.2–1.0)
pH, UA: 7 (ref 5.0–7.5)

## 2022-02-26 NOTE — Telephone Encounter (Signed)
Any future refills will require a appointment

## 2022-03-02 ENCOUNTER — Telehealth: Payer: Self-pay | Admitting: Family Medicine

## 2022-03-02 LAB — CULTURE, URINE COMPREHENSIVE

## 2022-03-02 NOTE — Telephone Encounter (Signed)
-----   Message from Debroah Loop, [place] sent at 03/02/2022  9:10 AM EST ----- Urine culture is negative for infection. Her most recent MR abdomen was in September 2023, so I do not think we need to repeat imaging at this time. I do think it would be appropriate to pursue cystoscopy with Dr. Erlene Quan for further evaluation of the blood in her urine, however this could very reasonably be explained by her small left renal mass that is currently being managed with surveillance. I think the urethral caruncle I noted on physical exam is less likely to be the source of the blood in her urine since her recent UA came from a catheterized specimen. Please offer her cysto.

## 2022-03-02 NOTE — Telephone Encounter (Signed)
LMOM for patient to return call.

## 2022-03-03 NOTE — Telephone Encounter (Signed)
LMOM for patient to return call. 2nd attempt

## 2022-03-08 NOTE — Telephone Encounter (Signed)
Patient notified and appointment has been scheduled 

## 2022-04-07 ENCOUNTER — Other Ambulatory Visit: Payer: Self-pay | Admitting: Urology

## 2022-04-07 ENCOUNTER — Ambulatory Visit (INDEPENDENT_AMBULATORY_CARE_PROVIDER_SITE_OTHER): Payer: Medicare Other | Admitting: Urology

## 2022-04-07 DIAGNOSIS — R310 Gross hematuria: Secondary | ICD-10-CM

## 2022-04-07 DIAGNOSIS — R350 Frequency of micturition: Secondary | ICD-10-CM | POA: Diagnosis not present

## 2022-04-07 DIAGNOSIS — D494 Neoplasm of unspecified behavior of bladder: Secondary | ICD-10-CM

## 2022-04-07 DIAGNOSIS — R300 Dysuria: Secondary | ICD-10-CM

## 2022-04-07 NOTE — Progress Notes (Unsigned)
Surgical Physician Order Form [facility]  * Scheduling expectation : Next Available  *Length of Case:   *Clearance needed: yes; hold pavix from prescribing MD  *Anticoagulation Instructions: Hold all anticoagulants  *Aspirin Instructions: Ok to continue Aspirin  *Post-op visit Date/Instructions:   1 week path review  *Diagnosis: Bladder Tumor  *Procedure: bilateral RTG, TURBT 2-5cm [id number]), intravesical chemotherapy   Additional orders: Gemcitabine 2000mg  bladder instillation  -Admit type: OUTpatient  -Anesthesia: General  -VTE Prophylaxis Standing Order SCD's       Other:   -Standing Lab Orders Per Anesthesia    Lab other: UA&Urine Culture  (she has cup, just needs to come and drop it off)  -Standing Test orders EKG/Chest x-ray per Anesthesia       Test other:   - Medications:  Ancef 2gm IV  -Other orders:  N/A

## 2022-04-07 NOTE — H&P (View-Only) (Signed)
   04/07/22  CC:  Chief Complaint  Patient presents with   Cysto    HPI: 82-year-old female who presents today for further evaluation of abnormal urinalysis along with painful urination and gross hematuria.  Please see previous notes for details.  Notably, she has been on peritoneal dialysis since 2024.  She is also being followed at [facility] for a renal mass on active surveillance.  They are following her serially with MRI annually.  NED. A&Ox3.   No respiratory distress   Abd soft, NT, ND Normal external genitalia with patent urethral meatus  Cystoscopy Procedure Note  Patient identification was confirmed, informed consent was obtained, and patient was prepped using Betadine solution.  Lidocaine jelly was administered per urethral meatus.    Procedure: - Flexible cystoscope introduced, without any difficulty.   - Thorough search of the bladder revealed:    Initially very cloudy urine.  Urine was aspirated to clear the bladder.  Upon doing so, a large nodular tumor became apparent at the right hemitrigone, right lateral bladder neck which had some superficial calcification and neovascularity.  This is highly suspicious for high-grade neoplasm, measured greater than 2 cm on best estimate.  The trigone was not able to be visualized bilaterally today due to the amount of debris and distortion.  Bimanual exam was performed and there was a nodular area near the location of her tumor on her anterior vaginal wall which is also concerning.  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  Plan to proceed with TURBT, possible bilateral retrograde pyelograms as well as intravesical gemcitabine.  We discussed risk benefits including risk of bleeding,  infection, damage surrounding structures, need for further procedures amongst others.  Depending on pathology, she will likely need further staging.  All questions were answered at this time.  Her daughter-in-law was also present by speaker phone today discussing this.  She will need a preop UA/urine culture.  She is also on Plavix will need clearance to hold this.   Raiquan Chandler, MD 

## 2022-04-07 NOTE — Progress Notes (Signed)
   04/07/22  CC:  Chief Complaint  Patient presents with   Cysto    HPI: 82 year old female who presents today for further evaluation of abnormal urinalysis along with painful urination and gross hematuria.  Please see previous notes for details.  Notably, she has been on peritoneal dialysis since 2024.  She is also being followed at [facility] for a renal mass on active surveillance.  They are following her serially with MRI annually.  NED. A&Ox3.   No respiratory distress   Abd soft, NT, ND Normal external genitalia with patent urethral meatus  Cystoscopy Procedure Note  Patient identification was confirmed, informed consent was obtained, and patient was prepped using Betadine solution.  Lidocaine jelly was administered per urethral meatus.    Procedure: - Flexible cystoscope introduced, without any difficulty.   - Thorough search of the bladder revealed:    Initially very cloudy urine.  Urine was aspirated to clear the bladder.  Upon doing so, a large nodular tumor became apparent at the right hemitrigone, right lateral bladder neck which had some superficial calcification and neovascularity.  This is highly suspicious for high-grade neoplasm, measured greater than 2 cm on best estimate.  The trigone was not able to be visualized bilaterally today due to the amount of debris and distortion.  Bimanual exam was performed and there was a nodular area near the location of her tumor on her anterior vaginal wall which is also concerning.  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  Plan to proceed with TURBT, possible bilateral retrograde pyelograms as well as intravesical gemcitabine.  We discussed risk benefits including risk of bleeding,  infection, damage surrounding structures, need for further procedures amongst others.  Depending on pathology, she will likely need further staging.  All questions were answered at this time.  Her daughter-in-law was also present by speaker phone today discussing this.  She will need a preop UA/urine culture.  She is also on Plavix will need clearance to hold this.   Hollice Espy, MD

## 2022-04-07 NOTE — Addendum Note (Signed)
Addended by: Kris Mouton on: 04/07/2022 03:58 PM   Modules accepted: Orders

## 2022-04-08 NOTE — Progress Notes (Signed)
   [facility]  Surgery Date: Date: 04/19/2022  Surgeon: Dr. Hollice Espy, MD  Inpt ( No  )   Outpt (Yes)   Obs ( No  )   Diagnosis: D49.4 Bladder Tumor  -CPT: [id number], [id number], [phone]  Surgery: Transurethral Resection of Bladder Tumor with Intravesical Instillation of Gemcitabine with Bilateral Retrograde Pyelograms  Stop Anticoagulations: Yes will need to hold Plavix, may continue ASA  Cardiac/Medical/Pulmonary Clearance needed: yes  Clearance needed from Dr: Ginette Pitman  Clearance request sent on: Date: 04/08/22  *Orders entered into EPIC  Date: 04/08/22   *Case booked in EPIC  Date: 04/08/22  *Notified pt of Surgery: Date: 04/08/22  PRE-OP UA & CX: yes, will obtain in clinic tomorrow 04/09/2022  *Placed into Prior Authorization Work Fabio Bering Date: 04/08/22  Assistant/laser/rep:No

## 2022-04-08 NOTE — Progress Notes (Signed)
  Phone Number: [phone] for Surgical Coordinator Fax Number: [phone]  REQUEST FOR SURGICAL CLEARANCE       Date: Date: 04/08/22  Faxed to: Dr. Ginette Pitman, MD  Surgeon: Dr. Hollice Espy, MD      Date of Surgery: 04/19/2022  Operation: Transurethral Resection of Bladder Tumor with Intravesical Instillation of Gemcitabine with Bilateral Retrograde Pyelograms   Anesthesia Type: General   Diagnosis: Bladder Tumor  Patient Requires:   Medical Clearance : Yes  Reason: We need a clearance to hold her Plavix prior to surgery, Per Dr. Erlene Quan patient may remain on ASA.    Risk Assessment:    Low   []       Moderate   []     High   []           This patient is optimized for surgery  YES []       NO   []    I recommend further assessment/workup prior to surgery. YES []      NO  []   Appointment scheduled for: _______________________   Further recommendations: ____________________________________     Physician Signature:__________________________________   Printed Name: ________________________________________   Date: _________________

## 2022-04-09 ENCOUNTER — Telehealth: Payer: Self-pay

## 2022-04-09 ENCOUNTER — Other Ambulatory Visit: Payer: Medicare Other

## 2022-04-09 DIAGNOSIS — D494 Neoplasm of unspecified behavior of bladder: Secondary | ICD-10-CM

## 2022-04-09 LAB — URINALYSIS, COMPLETE
Bilirubin, UA: NEGATIVE
Glucose, UA: NEGATIVE
Nitrite, UA: NEGATIVE
Specific Gravity, UA: 1.025 (ref 1.005–1.030)
Urobilinogen, Ur: 0.2 mg/dL (ref 0.2–1.0)
pH, UA: 6.5 (ref 5.0–7.5)

## 2022-04-09 LAB — MICROSCOPIC EXAMINATION
RBC, Urine: >30 /hpf — AB (ref 0–2)
WBC, UA: >30 /hpf — AB (ref 0–5)

## 2022-04-09 NOTE — Telephone Encounter (Signed)
I spoke with Megan [name]. We have discussed possible surgery dates and Monday April 1st, 2024 was agreed upon by all parties. Patient given information about surgery date, what to expect pre-operatively and post operatively.  We discussed that a Pre-Admission Testing office will be calling to set up the pre-op visit that will take place prior to surgery, and that these appointments are typically done over the phone with a Pre-Admissions RN. Informed patient that our office will communicate any additional care to be provided after surgery. Patients questions or concerns were discussed during our call. Advised to call our office should there be any additional information, questions or concerns that arise. Patient verbalized understanding.

## 2022-04-09 NOTE — Telephone Encounter (Signed)
Patient notified that Dr Marcello Fennel and his CMA are out of the office this afternoon.  She will await a callback on Monday with recommendations.

## 2022-04-12 ENCOUNTER — Telehealth: Payer: Self-pay

## 2022-04-12 NOTE — Telephone Encounter (Signed)
Received a clearance today from Dr. Ginette Pitman. I Spoke with patient. Patient advised to hold Plavix starting on Wednesday 03/27. Patient verbalized understanding.

## 2022-04-13 ENCOUNTER — Other Ambulatory Visit: Payer: Self-pay

## 2022-04-13 ENCOUNTER — Encounter: Payer: Self-pay | Admitting: Urology

## 2022-04-13 ENCOUNTER — Encounter
Admission: RE | Admit: 2022-04-13 | Discharge: 2022-04-13 | Disposition: A | Discharge status: Home / Self Care | Payer: Medicare Other | Source: Ambulatory Visit | Attending: Urology | Admitting: Urology

## 2022-04-13 NOTE — Patient Instructions (Signed)
Your procedure is scheduled on: Monday 04/19/22 To find out your arrival time, please call [phone] between 1PM - 3PM on:   Friday 04/16/22 Report to the Registration Desk on the 1st floor of the [facility]. Valet parking is available.  If your arrival time is 6:00 am, do not arrive before that time as the [facility] entrance doors do not open until 6:00 am.  REMEMBER: Instructions that are not followed completely may result in serious medical risk, up to and including death; or upon the discretion of your surgeon and anesthesiologist your surgery may need to be rescheduled.  Do not eat food or drink any liquids after midnight the night before surgery.  No gum chewing or hard candies.  One week prior to surgery: Stop Anti-inflammatories (NSAIDS) such as Advil, Aleve, Ibuprofen, Motrin, Naproxen, Naprosyn and Aspirin based products such as Excedrin, Goody's Powder, BC Powder. You may however, continue to take Tylenol if needed for pain up until the day of surgery.  Stop ANY OVER THE COUNTER supplements until after surgery.  Continue taking all prescribed medications with the exception of the following: Stop Plavix on Wednesday 04/14/22  **Follow guidelines for insulin and diabetes medications** Decrease bedtime Novolog insulin sliding scale to half the usual dose. 6 to 3, 8 to 4, 10 to 5, 12 to 6 only the night before surgery. NO insulin the day of surgery.   TAKE ONLY THESE MEDICATIONS THE MORNING OF SURGERY WITH A SIP OF WATER:  allopurinol (ZYLOPRIM) 100 MG tablet  metoprolol tartrate (LOPRESSOR) 12.5 MG tablet  pantoprazole (PROTONIX) 40 MG tablet    Antacid (take one the night before and one on the morning of surgery - helps to prevent nausea after surgery.)  Use inhalers on the day of surgery and bring to the hospital.  No Alcohol for 24 hours before or after surgery.  No Smoking including e-cigarettes for 24 hours before surgery.  No chewable tobacco products for at  least 6 hours before surgery.  No nicotine patches on the day of surgery.  Do not use any "recreational" drugs for at least a week (preferably 2 weeks) before your surgery.  Please be advised that the combination of cocaine and anesthesia may have negative outcomes, up to and including death. If you test positive for cocaine, your surgery will be cancelled.  On the morning of surgery brush your teeth with toothpaste and water, you may rinse your mouth with mouthwash if you wish. Do not swallow any toothpaste or mouthwash.  Use CHG Soap or wipes as directed on instruction sheet.  Do not wear lotions, powders, or perfumes.   Do not shave body hair from the neck down 48 hours before surgery.  Wear comfortable clothing (specific to your surgery type) to the hospital.  Do not wear jewelry, make-up, hairpins, clips or nail polish.  Contact lenses, hearing aids and dentures may not be worn into surgery.  Do not bring valuables to the hospital. [facility] is not responsible for any missing/lost belongings or valuables.   Notify your doctor if there is any change in your medical condition (cold, fever, infection).  If you are being discharged the day of surgery, you will not be allowed to drive home. You will need a responsible individual to drive you home and stay with you for 24 hours after surgery.   If you are taking public transportation, you will need to have a responsible individual with you.  If you are being admitted to the  hospital overnight, leave your suitcase in the car. After surgery it may be brought to your room.  In case of increased patient census, it may be necessary for you, the patient, to continue your postoperative care in the Same Day Surgery department.  After surgery, you can help prevent lung complications by doing breathing exercises.  Take deep breaths and cough every 1-2 hours. Your doctor may order a device called an Incentive Spirometer to help you take  deep breaths. When coughing or sneezing, hold a pillow firmly against your incision with both hands. This is called "splinting." Doing this helps protect your incision. It also decreases belly discomfort.  Surgery Visitation Policy:  Patients undergoing a surgery or procedure may have two family members or support persons with them as long as the person is not COVID-19 positive or experiencing its symptoms.   Inpatient Visitation:    Visiting hours are 7 a.m. to 8 p.m. Up to four visitors are allowed at one time in a patient room. The visitors may rotate out with other people during the day. One designated support person (adult) may remain overnight.   Please call the [facility]. at [phone] if you have any questions about these instructions.     Preparing for Surgery with CHLORHEXIDINE GLUCONATE (CHG) Soap  Chlorhexidine Gluconate (CHG) Soap  o An antiseptic cleaner that kills germs and bonds with the skin to continue killing germs even after washing  o Used for showering the night before surgery and morning of surgery  Before surgery, you can play an important role by reducing the number of germs on your skin.  CHG (Chlorhexidine gluconate) soap is an antiseptic cleanser which kills germs and bonds with the skin to continue killing germs even after washing.  Please do not use if you have an allergy to CHG or antibacterial soaps. If your skin becomes reddened/irritated stop using the CHG.  1. Shower the NIGHT BEFORE SURGERY and the MORNING OF SURGERY with CHG soap.  2. If you choose to wash your hair, wash your hair first as usual with your normal shampoo.  3. After shampooing, rinse your hair and body thoroughly to remove the shampoo.  4. Use CHG as you would any other liquid soap. You can apply CHG directly to the skin and wash gently with a scrungie or a clean washcloth.  5. Apply the CHG soap to your body only from the neck down. Do not use on open  wounds or open sores. Avoid contact with your eyes, ears, mouth, and genitals (private parts). Wash face and genitals (private parts) with your normal soap.  6. Wash thoroughly, paying special attention to the area where your surgery will be performed.  7. Thoroughly rinse your body with warm water.  8. Do not shower/wash with your normal soap after using and rinsing off the CHG soap.  9. Pat yourself dry with a clean towel.  10. Wear clean pajamas to bed the night before surgery.  12. Place clean sheets on your bed the night of your first shower and do not sleep with pets.  13. Shower again with the CHG soap on the day of surgery prior to arriving at the hospital.  14. Do not apply any deodorants/lotions/powders.  15. Please wear clean clothes to the hospital.

## 2022-04-14 ENCOUNTER — Encounter: Payer: Self-pay | Admitting: Urology

## 2022-04-14 LAB — CULTURE, URINE COMPREHENSIVE

## 2022-04-14 NOTE — Progress Notes (Signed)
Perioperative / Anesthesia Services  Pre-Admission Testing Clinical Review / Preoperative Anesthesia Consult  Date: 04/15/22  Patient Demographics:  Name: Megan [name] DOB:   18-May-1940 MRN:   [id number]  Planned Surgical Procedure(s):    Case: [id number] Date/Time: 04/19/22 1004   Procedures:      TRANSURETHRAL RESECTION OF BLADDER TUMOR (TURBT)     CYSTOSCOPY WITH RETROGRADE PYELOGRAM (Bilateral)     BLADDER INSTILLATION OF GEMCITABINE   Anesthesia type: General   Pre-op diagnosis: Bladder Tumor   Location: [facility]   Surgeons: Hollice Espy, MD     NOTE: Available PAT nursing documentation and vital signs have been reviewed. Clinical nursing staff has updated patient's PMH/PSHx, current medication list, and drug allergies/intolerances to ensure comprehensive history available to assist in medical decision making as it pertains to the aforementioned surgical procedure and anticipated anesthetic course. Extensive review of available clinical information personally performed. [facility] and [facility] updated with any diagnoses/procedures that  may have been inadvertently omitted during her intake with the pre-admission testing department's nursing staff.  Clinical Discussion:  Megan [name] is a 82 y.o. female who is submitted for pre-surgical anesthesia review and clearance prior to her undergoing the above procedure. Patient is a Former Smoker (30 pack years; quit 01/2002). 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  Patient is followed by cardiology Saralyn Pilar, MD). She was last seen in the cardiology  clinic on 11/30/2021; notes reviewed. At the time of her clinic visit, patient doing well overall from a cardiovascular perspective.  Patient complaining of exertional dyspnea, which was noted to be stable and at baseline. Patient denied any chest pain, PND, orthopnea, palpitations, significant peripheral edema, weakness, fatigue, vertiginous symptoms, or presyncope/syncope. Patient with a past medical history significant for cardiovascular diagnoses. Documented physical exam was grossly benign, providing no evidence of acute exacerbation and/or decompensation of the patient's known cardiovascular conditions. Of note, complete records regarding patient's past medical history unable for review at time of consult. Information gathered from patient report and from available notes provided by patient's local providers.   Patient suffered a LEFT corona radiata infarct in 2004. Patient was treated at [facility] in [place], [place]. Patient recovered with rehabilitation and therapy. She has no significant lasting neurological deficits following her CVA.   Carotid doppler performed on 11/22/2013 demonstrated MILD (<50%) stenosis of the patient's BILATERAL internal carotid arteries. Vetebral arteries demonstrated antegrade flow.   Patient experienced a TIA in 2017. Again, patient with no residual deficits resulting from neurological event.   Myocardial perfusion imaging study performed on 10/24/2018 revealed a normal left ventricular systolic function with a hyperdynamic LVEF of 71%.  There were no regional wall motion abnormalities.  There was no evidence of stress-induced myocardial ischemia or arrhythmia; no scintigraphic evidence of scar.  Study determined to be normal and low risk.  Most recent TTE was performed on 07/25/2019 revealing a normal left ventricular systolic function with an EF of 60%.  There were no regional wall motion abnormalities.  Right ventricular size and function normal.   Left atrium mildly dilated.  Aortic valve with mildly thickened leaflets and normal excursion.  There was trivial mitral, tricuspid, and  pulmonic valve regurgitation.  RVSP normal at 31 mmHg. All transvalvular gradients were noted to be normal providing no evidence suggestive of valvular stenosis.   Following CVA, patient remains on chronic antithrombotic therapy using clopidogrel. Patient is reported to be compliant with therapy with no evidence or reports of GI bleeding. Blood pressure reasonably controlled at 140/78 mmHg on currently prescribed beta-blocker (metoprolol tartrate) and diuretic (spironolactone + torsemide) therapies.  Patient is on atorvastatin for her HLD diagnosis and ASCVD prevention. T2DM loosely controlled on currently prescribed regimen; last HgbA1c was 7.9% when checked on 10/13/2021.  Of note, patient's A1c has been rechecked since she was last seen by cardiology, with worsening to 8.4% on 03/09/2022.  She does not have an OSAH diagnosis. Functional capacity, as defined by DASI, is documented as being >/= 4 METS. No changes were made to her medication regimen during her visit with cardiology.  Patient scheduled to follow-up with outpatient cardiology in 4 months or sooner if needed.  Megan [name] is scheduled for a TRANSURETHRAL RESECTION OF BLADDER TUMOR (TURBT); CYSTOSCOPY WITH RETROGRADE PYELOGRAM (Bilateral); BLADDER INSTILLATION OF GEMCITABINE on 04/19/2022 with Dr. Hollice Espy, MD.  Given patient's past medical history significant for cardiovascular diagnoses, presurgical cardiac clearance was sought by the PAT team. Per cardiology, "this patient is optimized for surgery and may proceed with the planned procedural course with a LOW risk of significant perioperative cardiovascular complications".  Again, this patient is on daily antiplatelet therapy. She has been instructed on recommendations for holding her clopidogrel for 5 days prior to her procedure with plans to restart  as soon as postoperative bleeding risk felt to be minimized by her attending surgeon. The patient has been instructed that her last dose of her Clopidogrel should be on 04/13/2022.  Patient denies previous perioperative complications with anesthesia in the past. In review of the available records, it is noted that patient underwent a general anesthetic course at [facility] ([facility]) in 11/2019 without documented complications.      04/13/2022    2:10 PM 02/25/2022    2:17 PM 02/03/2022    1:50 PM  Vitals with BMI  Height 5\' 3"  5\' 3"  5\' 3"   Weight 203 lbs 209 lbs 215 lbs  BMI 35.97 [id number]  Systolic  [id number]  Diastolic  60 71  Pulse  83 87    Providers/Specialists:   NOTE: Primary physician provider listed below. Patient may have been seen by APP or partner within same practice.   PROVIDER ROLE / SPECIALTY LAST Lu Duffel, MD Urology (Surgeon) 04/07/2022  Tracie Harrier, MD Primary Care Provider 02/24/2022   Isaias Cowman, MD Cardiology 11/30/2021  Mee Hives, MD Endocrinology 03/09/2022  Sharlet Salina, MD Physiatry 11/23/2021  Jennings Books, MD Neurology 07/02/2021  Lavonia Dana, MD Nephrology ???   Allergies:  Crestor [rosuvastatin], Iodinated contrast media, Pioglitazone, Sulfa antibiotics, Sulfonamide derivatives, Losartan, and Sulfasalazine  Current Home Medications:   No current facility-administered medications for this encounter.    acetaminophen (TYLENOL) 500 MG tablet   albuterol (PROVENTIL HFA;VENTOLIN HFA) 108 (90 Base) MCG/ACT inhaler   allopurinol (ZYLOPRIM) 100 MG tablet   ANORO ELLIPTA 62.5-25 MCG/ACT AEPB   atorvastatin (LIPITOR) 40 MG tablet   calcitRIOL (ROCALTROL) 0.25 MCG capsule   ergocalciferol (VITAMIN D2) 50000 UNITS capsule   gentamicin cream (GARAMYCIN) 0.1 %   insulin aspart (NOVOLOG) 100 UNIT/ML injection   insulin degludec (TRESIBA) 200 UNIT/ML FlexTouch Pen   Magnesium  400  MG TABS   metoprolol tartrate (LOPRESSOR) 25 MG tablet   montelukast (SINGULAIR) 10 MG tablet   multivitamin (RENA-VIT) TABS tablet   mupirocin cream (BACTROBAN) 2 %   nitroGLYCERIN (NITROSTAT) 0.4 MG SL tablet   ondansetron (ZOFRAN-ODT) 4 MG disintegrating tablet   ONETOUCH VERIO test strip   pantoprazole (PROTONIX) 40 MG tablet   PLAVIX 75 MG tablet   potassium chloride (MICRO-K) 10 MEQ CR capsule   pregabalin (LYRICA) 50 MG capsule   sevelamer carbonate (RENVELA) 800 MG tablet   spironolactone (ALDACTONE) 25 MG tablet   torsemide (DEMADEX) 100 MG tablet   zolpidem (AMBIEN) 5 MG tablet   ciprofloxacin (CIPRO) 250 MG tablet   conjugated estrogens (PREMARIN) vaginal cream   hyoscyamine (LEVSIN SL) 0.125 MG SL tablet   History:   Past Medical History:  Diagnosis Date   Adenomatous colon polyp    Allergic rhinitis    Anemia of chronic renal failure    Anxiety    Aortic atherosclerosis (HCC)    Asthma    Bilateral carotid artery disease (HCC)    a.) carotid doppler 11/22/2013: mild (<50%) BICA   CHF (congestive heart failure) ([place])    a.)TTE 04/13/2012: EF 65-70%, triv MR, G1DD; b.) TTE 03/28/2017: EF >55%, mild LAE, triv AR/MR/PR, mild TR, G1DD; c.) TTE 12/30/2017: EF 60-65%, mild MR, G1DD; d.) TTE 07/25/2019: EF 60%, mild LAE, triv MR/TR/PR   Clear cell renal cell carcinoma, left (HCC)    a.) s/p partial nephrectomy in 2015   COPD (chronic obstructive pulmonary disease) (HCC)    Coronary artery calcification seen on CT scan    DDD (degenerative disc disease), lumbar    Diverticulosis of colon    Esophageal stricture    ESRD on peritoneal dialysis ([place])    Gastritis    GERD (gastroesophageal reflux disease)    Gout    Hepatic steatosis    Hiatal hernia    History of chickenpox    History of CVA (cerebrovascular accident) 2004   a.) presented with hemiparesis with slurred speech; imaging revealed LEFT corona radiata small vessel infarct ; carotid doppler and TTE  negative; Tx'd at [facility] in [place]; recovered with rehab   Hyperlipidemia    Hypertension    Insomnia    a.) on hyponotic (zolpidem) PRN   Internal hemorrhoids    Long term current use of antithrombotics/antiplatelets    a.) clopidogrel   Loss of smell 2004   due to CVA   Mild cognitive impairment    Periumbilical hernia    a.) s/p repair 11/2019   PUD (peptic ulcer disease)    Sciatica    SOB (shortness of breath)    TIA (transient ischemic attack) 2017   Type 2 diabetes mellitus treated with insulin ([place])    Vitamin D deficiency    Past Surgical History:  Procedure Laterality Date   ABDOMINAL HYSTERECTOMY     ABDOMINAL HYSTERECTOMY     APPENDECTOMY     CAPD INSERTION N/A 01/31/2019   Procedure: LAPAROSCOPIC INSERTION CONTINUOUS AMBULATORY PERITONEAL DIALYSIS  (CAPD) CATHETER;  Surgeon: Algernon Huxley, MD;  Location: [facility] ORS;  Service: General;  Laterality: N/A;   CESAREAN SECTION     x 2   COLONOSCOPY WITH PROPOFOL N/A 07/23/2015   Procedure: COLONOSCOPY WITH PROPOFOL;  Surgeon: Manya Silvas, MD;  Location: [facility];  Service: Endoscopy;  Laterality: N/A;   ESOPHAGOGASTRODUODENOSCOPY (EGD) WITH PROPOFOL N/A 07/23/2015   Procedure: ESOPHAGOGASTRODUODENOSCOPY (EGD) WITH PROPOFOL;  Surgeon: Manya Silvas, MD;  Location: [facility] ENDOSCOPY;  Service: Endoscopy;  Laterality: N/A;   HEMORROIDECTOMY     LAPAROSCOPIC INCISIONAL / UMBILICAL / VENTRAL HERNIA REPAIR N/A 12/17/2019   Procedure: ROBOTIC SURGICAL SYSTEM  INCISIONAL HERNIA REPAIR; Location: [facility]   PARTIAL NEPHRECTOMY Left    Family History  Problem Relation Age of Onset   GER disease Father    Heart failure Father    Colon polyps Father    Ovarian cancer Mother    Colon cancer Other        aunt   Diabetes Other    Colon polyps Other        aunt   CAD Neg Hx    Social History   Tobacco Use   Smoking status: Former    Packs/day: 1.00     Years: 30.00    Additional pack years: 0.00    Total pack years: 30.00    Types: Cigarettes    Quit date: 01/18/2002    Years since quitting: 20.2    Passive exposure: Never   Smokeless tobacco: Never  Vaping Use   Vaping Use: Never used  Substance Use Topics   Alcohol use: No   Drug use: No    Pertinent Clinical Results:  LABS:   Hospital Outpatient Visit on 04/15/2022  Component Date Value Ref Range Status   Sodium 04/15/2022 136  135 - 145 mmol/L Final   Potassium 04/15/2022 4.2  3.5 - 5.1 mmol/L Final   Chloride 04/15/2022 95 (L)  98 - 111 mmol/L Final   CO2 04/15/2022 27  22 - 32 mmol/L Final   Glucose, Bld 04/15/2022 250 (H)  70 - 99 mg/dL Final   Glucose reference range applies only to samples taken after fasting for at least 8 hours.   BUN 04/15/2022 36 (H)  8 - 23 mg/dL Final   Creatinine, Ser 04/15/2022 8.57 (H)  0.44 - 1.00 mg/dL Final   Calcium 04/15/2022 9.3  8.9 - 10.3 mg/dL Final   GFR, Estimated 04/15/2022 4 (L)  >60 mL/min Final   Comment: (NOTE) Calculated using the CKD-EPI Creatinine Equation (2021)    Anion gap 04/15/2022 14  5 - 15 Final   Performed at [facility], [address] [place], [place] [address]   WBC 04/15/2022 11.8 (H)  4.0 - 10.5 K/uL Final   RBC 04/15/2022 3.28 (L)  3.87 - 5.11 MIL/uL Final   Hemoglobin 04/15/2022 10.1 (L)  12.0 - 15.0 g/dL Final   HCT 04/15/2022 31.6 (L)  36.0 - 46.0 % Final   MCV 04/15/2022 96.3  80.0 - 100.0 fL Final   MCH 04/15/2022 30.8  26.0 - 34.0 pg Final   MCHC 04/15/2022 32.0  30.0 - 36.0 g/dL Final   RDW 04/15/2022 15.1  11.5 - 15.5 % Final   Platelets 04/15/2022 274  150 - 400 K/uL Final   nRBC 04/15/2022 0.0  0.0 - 0.2 % Final   Performed at [facility], [address] [place], [place] [address]    ECG: Date: 04/15/2022 Time ECG obtained: 1416 PM Rate: 84 bpm Rhythm: normal sinus Axis (leads I and aVF): Normal Intervals: PR 174 ms. QRS 76 ms. QTc 453 ms. ST segment and T wave  changes: No evidence of acute ST segment elevation or depression Comparison: Similar to previous tracing obtained on 12/09/2020   IMAGING / PROCEDURES: DG ABD 1 VIEW performed on 01/29/2022 Peritoneal dialysis catheter overlies the  LEFT mid abdomen without evidence of kinking.  CT LUMBAR SPINE performed on 12/09/2020 No acute osseous abnormality. Grade 1 anterolisthesis L4 on L5. Multilevel degenerative changes, worst at L4-L5 and L5-S1. Suspect that there is moderate severe foraminal stenosis left greater than right at L5-S1. There is severe facet arthropathy at L4-L5 and L5-S1.  Hydronephrosis on the right as noted on the prior CT  CT ABDOMEN PELVIS WO CONTRAST performed on 12/09/2020 Moderate right-sided hydronephrosis likely due to a mild UPJ obstruction. No obstructing ureteral calculi or bladder calculi. Chronic scarring changes involving the left kidney. Progressive moderate-sized hiatal hernia. Advanced atherosclerotic calcifications involving the aorta and branch vessels. Diffuse colonic diverticulosis without findings for acute diverticulitis. Aortic atherosclerosis  Emphysema  MR ABDOMEN WO CONTRAST performed on 11/28/2020 2 mm lesion in the [name] pole left kidney is nearly isointense to renal parenchyma on today's noncontrast exam while this may be a complex cyst, neoplasm could also have this appearance. Close follow-up recommended. MRI with and without contrast using group II gadolinium contrast agent might also be a consideration. Multiple tiny T2 hyperintensities in both kidneys, incompletely characterized on today's exam without contrast material but likely cysts. Partial nephrectomy defect left kidney. Small volume free fluid in the abdomen.  MR LUMBAR SPINE WO CONTRAST performed on 06/06/2020 Moderate L4-L5 and mild L5-S1 spinal canal stenosis due to combination of disc bulge and severe facet arthrosis. Severe left L5-S1 neural foraminal stenosis. Grade 1 L4-5  anterolisthesis due to facet arthrosis.  MR BRAIN WO CONTRAST performed on 06/06/2020 No acute intracranial abnormality. 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  TRANSTHORACIC ECHOCARDIOGRAM performed on 07/25/2019 The left ventricle is normal in size with mildly increased wall thickness.  The left ventricular systolic function is normal, LVEF is visually estimated at 60%.  The right ventricle is normal in size, with normal systolic function.  The left atrium is mildly dilated in size.  The aortic valve is trileaflet with mildly thickened leaflets with normal excursion.  There is trivial mitral valve regurgitation.  There is trivial tricuspid regurgitation.  Estimated RVSP 31 mmHg.  There is trivial pulmonic regurgitation.  There is no pericardial effusion.   Impression and Plan:  Megan [name] has been referred for pre-anesthesia review and clearance prior to her undergoing the planned anesthetic and procedural courses. Available labs, pertinent testing, and imaging results were personally reviewed by me in preparation for upcoming operative/procedural course. [facility] medical record has been updated following extensive record review and patient interview with PAT staff.   This patient has been appropriately cleared by cardiology with an overall LOW risk of significant perioperative cardiovascular complications. Based on clinical review performed today (04/15/22), barring any significant acute changes in the patient's overall condition, it is anticipated that she will be able to proceed with the planned surgical intervention. Any acute changes in clinical condition may necessitate her procedure being postponed and/or cancelled. Patient will meet with anesthesia team (MD and/or CRNA) on the day of her procedure for preoperative evaluation/assessment. Questions regarding anesthetic course will be fielded at that time.   Pre-surgical  instructions were reviewed with the patient during her PAT appointment, and questions were fielded to satisfaction by PAT clinical staff. She has been instructed on which medications that she will need to hold prior to surgery, as well as the ones that have been deemed safe/appropriate to take of the day of her procedure. As part of the general education provided by PAT, patient  made aware both verbally and in writing, that she would need to abstain from the use of any illegal substances during her perioperative course.  She was advised that failure to follow the provided instructions could necessitate case cancellation or result serious perioperative complications up to and including death. Patient encouraged to contact PAT and/or her surgeon's office to discuss any questions or concerns that may arise prior to surgery; verbalized understanding.   Honor Loh, MSN, APRN, FNP-C, CEN [facility]  Peri-operative Services Nurse Practitioner Phone: [phone] Fax: [phone] 04/15/22 3:57 PM  NOTE: This note has been prepared using Dragon dictation software. Despite my best ability to proofread, there is always the potential that unintentional transcriptional errors may still occur from this process.

## 2022-04-15 ENCOUNTER — Encounter
Admission: RE | Admit: 2022-04-15 | Discharge: 2022-04-15 | Disposition: A | Discharge status: Home / Self Care | Payer: Medicare Other | Source: Ambulatory Visit | Attending: Urology | Admitting: Urology

## 2022-04-15 ENCOUNTER — Telehealth: Payer: Self-pay | Admitting: *Deleted

## 2022-04-15 ENCOUNTER — Other Ambulatory Visit: Payer: Self-pay | Admitting: Urology

## 2022-04-15 DIAGNOSIS — E875 Hyperkalemia: Secondary | ICD-10-CM

## 2022-04-15 DIAGNOSIS — E1169 Type 2 diabetes mellitus with other specified complication: Secondary | ICD-10-CM | POA: Diagnosis not present

## 2022-04-15 DIAGNOSIS — E785 Hyperlipidemia, unspecified: Secondary | ICD-10-CM | POA: Diagnosis not present

## 2022-04-15 DIAGNOSIS — E1122 Type 2 diabetes mellitus with diabetic chronic kidney disease: Secondary | ICD-10-CM

## 2022-04-15 DIAGNOSIS — Z794 Long term (current) use of insulin: Secondary | ICD-10-CM | POA: Diagnosis not present

## 2022-04-15 DIAGNOSIS — Z01818 Encounter for other preprocedural examination: Secondary | ICD-10-CM | POA: Diagnosis not present

## 2022-04-15 DIAGNOSIS — E1121 Type 2 diabetes mellitus with diabetic nephropathy: Secondary | ICD-10-CM | POA: Insufficient documentation

## 2022-04-15 DIAGNOSIS — I10 Essential (primary) hypertension: Secondary | ICD-10-CM | POA: Insufficient documentation

## 2022-04-15 DIAGNOSIS — N184 Chronic kidney disease, stage 4 (severe): Secondary | ICD-10-CM | POA: Diagnosis not present

## 2022-04-15 DIAGNOSIS — R9431 Abnormal electrocardiogram [ECG] [EKG]: Secondary | ICD-10-CM | POA: Insufficient documentation

## 2022-04-15 DIAGNOSIS — D62 Acute posthemorrhagic anemia: Secondary | ICD-10-CM

## 2022-04-15 LAB — BASIC METABOLIC PANEL
Anion gap: 14 (ref 5–15)
BUN: 36 mg/dL — ABNORMAL HIGH (ref 8–23)
CO2: 27 mmol/L (ref 22–32)
Calcium: 9.3 mg/dL (ref 8.9–10.3)
Chloride: 95 mmol/L — ABNORMAL LOW (ref 98–111)
Creatinine, Ser: 8.57 mg/dL — ABNORMAL HIGH (ref 0.44–1.00)
GFR, Estimated: 4 mL/min — ABNORMAL LOW (ref >60)
Glucose, Bld: 250 mg/dL — ABNORMAL HIGH (ref 70–99)
Potassium: 4.2 mmol/L (ref 3.5–5.1)
Sodium: 136 mmol/L (ref 135–145)

## 2022-04-15 LAB — CBC
HCT: 31.6 % — ABNORMAL LOW (ref 36.0–46.0)
Hemoglobin: 10.1 g/dL — ABNORMAL LOW (ref 12.0–15.0)
MCH: 30.8 pg (ref 26.0–34.0)
MCHC: 32 g/dL (ref 30.0–36.0)
MCV: 96.3 fL (ref 80.0–100.0)
Platelets: 274 10*3/uL (ref 150–400)
RBC: 3.28 MIL/uL — ABNORMAL LOW (ref 3.87–5.11)
RDW: 15.1 % (ref 11.5–15.5)
WBC: 11.8 10*3/uL — ABNORMAL HIGH (ref 4.0–10.5)
nRBC: 0 % (ref 0.0–0.2)

## 2022-04-15 MED ORDER — CIPROFLOXACIN HCL 250 MG PO TABS: 250.0000 mg | ORAL_TABLET | Freq: Two times a day (BID) | ORAL | 0 refills | Status: DC

## 2022-04-15 MED ORDER — AMOXICILLIN 500 MG PO TABS: 250.0000 mg | ORAL_TABLET | Freq: Two times a day (BID) | ORAL | 0 refills | Status: AC

## 2022-04-15 NOTE — Progress Notes (Signed)
Patient scheduled undergo TURBT with Dr. Erlene Quan on 04/19/2022 for bladder tumor.  ESRD on peritoneal dialysis.  Preop urine culture grew >100k Enterococcus, was prescribed Cipro by PA and pharmacy did not feel comfortable filling with her ESRD.  I changed her to amoxicillin 250 mg twice daily based on renal dosing and with her Enterococcus culture.  Discussed with patient, she understands need for amoxicillin antibiotic, does not need to take the Cipro or Keflex that was previously prescribed.  Nickolas Madrid, MD 04/15/2022

## 2022-04-15 NOTE — Telephone Encounter (Signed)
-----   Message from Debroah Loop, [place] sent at 04/15/2022  9:50 AM EDT ----- Please start Cipro 250mg  BID x5 days in advance of surgery.

## 2022-04-15 NOTE — Telephone Encounter (Signed)
Spoke with patient and advised results rx sent to pharmacy by e-script  

## 2022-04-18 MED ORDER — GEMCITABINE CHEMO FOR BLADDER INSTILLATION 2000 MG
2000.0000 mg | Freq: Once | INTRAVENOUS | Status: DC
  Filled 2022-04-18: qty 52.6

## 2022-04-18 MED ORDER — ORAL CARE MOUTH RINSE: 15.0000 mL | Freq: Once | OROMUCOSAL | Status: AC

## 2022-04-18 MED ORDER — SODIUM CHLORIDE 0.9 % IV SOLN: INTRAVENOUS | Status: DC

## 2022-04-18 MED ORDER — CHLORHEXIDINE GLUCONATE 0.12 % MT SOLN: 15.0000 mL | Freq: Once | OROMUCOSAL | Status: AC

## 2022-04-18 MED ORDER — CEFAZOLIN SODIUM-DEXTROSE 2-4 GM/100ML-% IV SOLN
2.0000 g | INTRAVENOUS | Status: AC
  Administered 2022-04-19: 2 g via INTRAVENOUS

## 2022-04-19 ENCOUNTER — Ambulatory Visit: Payer: Medicare Other | Admitting: Urgent Care

## 2022-04-19 ENCOUNTER — Ambulatory Visit
Admission: RE | Admit: 2022-04-19 | Discharge: 2022-04-19 | Disposition: A | Discharge status: Home / Self Care | Payer: Medicare Other | Source: Ambulatory Visit | Attending: Urology | Admitting: Urology

## 2022-04-19 ENCOUNTER — Other Ambulatory Visit: Payer: Self-pay

## 2022-04-19 ENCOUNTER — Ambulatory Visit: Payer: Medicare Other

## 2022-04-19 ENCOUNTER — Encounter: Payer: Self-pay | Admitting: Urology

## 2022-04-19 ENCOUNTER — Encounter
Admission: RE | Disposition: A | Discharge status: Home / Self Care | Payer: Self-pay | Source: Ambulatory Visit | Attending: Urology

## 2022-04-19 DIAGNOSIS — Z8249 Family history of ischemic heart disease and other diseases of the circulatory system: Secondary | ICD-10-CM | POA: Diagnosis not present

## 2022-04-19 DIAGNOSIS — J4489 Other specified chronic obstructive pulmonary disease: Secondary | ICD-10-CM | POA: Insufficient documentation

## 2022-04-19 DIAGNOSIS — Z85528 Personal history of other malignant neoplasm of kidney: Secondary | ICD-10-CM | POA: Insufficient documentation

## 2022-04-19 DIAGNOSIS — Z794 Long term (current) use of insulin: Secondary | ICD-10-CM | POA: Diagnosis not present

## 2022-04-19 DIAGNOSIS — Z6836 Body mass index (BMI) 36.0-36.9, adult: Secondary | ICD-10-CM | POA: Insufficient documentation

## 2022-04-19 DIAGNOSIS — N186 End stage renal disease: Secondary | ICD-10-CM | POA: Insufficient documentation

## 2022-04-19 DIAGNOSIS — Z905 Acquired absence of kidney: Secondary | ICD-10-CM | POA: Insufficient documentation

## 2022-04-19 DIAGNOSIS — C675 Malignant neoplasm of bladder neck: Secondary | ICD-10-CM | POA: Insufficient documentation

## 2022-04-19 DIAGNOSIS — D090 Carcinoma in situ of bladder: Secondary | ICD-10-CM | POA: Diagnosis not present

## 2022-04-19 DIAGNOSIS — I10 Essential (primary) hypertension: Secondary | ICD-10-CM

## 2022-04-19 DIAGNOSIS — C679 Malignant neoplasm of bladder, unspecified: Secondary | ICD-10-CM | POA: Diagnosis not present

## 2022-04-19 DIAGNOSIS — E1122 Type 2 diabetes mellitus with diabetic chronic kidney disease: Secondary | ICD-10-CM | POA: Diagnosis not present

## 2022-04-19 DIAGNOSIS — Z8673 Personal history of transient ischemic attack (TIA), and cerebral infarction without residual deficits: Secondary | ICD-10-CM | POA: Diagnosis not present

## 2022-04-19 DIAGNOSIS — D494 Neoplasm of unspecified behavior of bladder: Secondary | ICD-10-CM

## 2022-04-19 DIAGNOSIS — I132 Hypertensive heart and chronic kidney disease with heart failure and with stage 5 chronic kidney disease, or end stage renal disease: Secondary | ICD-10-CM | POA: Diagnosis not present

## 2022-04-19 DIAGNOSIS — G4700 Insomnia, unspecified: Secondary | ICD-10-CM | POA: Insufficient documentation

## 2022-04-19 DIAGNOSIS — Z992 Dependence on renal dialysis: Secondary | ICD-10-CM | POA: Diagnosis not present

## 2022-04-19 DIAGNOSIS — Z833 Family history of diabetes mellitus: Secondary | ICD-10-CM | POA: Diagnosis not present

## 2022-04-19 DIAGNOSIS — Z87891 Personal history of nicotine dependence: Secondary | ICD-10-CM | POA: Insufficient documentation

## 2022-04-19 DIAGNOSIS — Z7902 Long term (current) use of antithrombotics/antiplatelets: Secondary | ICD-10-CM | POA: Diagnosis not present

## 2022-04-19 DIAGNOSIS — E785 Hyperlipidemia, unspecified: Secondary | ICD-10-CM | POA: Insufficient documentation

## 2022-04-19 DIAGNOSIS — D62 Acute posthemorrhagic anemia: Secondary | ICD-10-CM

## 2022-04-19 DIAGNOSIS — N3289 Other specified disorders of bladder: Secondary | ICD-10-CM | POA: Diagnosis present

## 2022-04-19 DIAGNOSIS — I2510 Atherosclerotic heart disease of native coronary artery without angina pectoris: Secondary | ICD-10-CM | POA: Diagnosis not present

## 2022-04-19 DIAGNOSIS — E1169 Type 2 diabetes mellitus with other specified complication: Secondary | ICD-10-CM

## 2022-04-19 DIAGNOSIS — Z8711 Personal history of peptic ulcer disease: Secondary | ICD-10-CM | POA: Insufficient documentation

## 2022-04-19 DIAGNOSIS — E875 Hyperkalemia: Secondary | ICD-10-CM

## 2022-04-19 DIAGNOSIS — I509 Heart failure, unspecified: Secondary | ICD-10-CM | POA: Diagnosis not present

## 2022-04-19 DIAGNOSIS — K219 Gastro-esophageal reflux disease without esophagitis: Secondary | ICD-10-CM | POA: Diagnosis not present

## 2022-04-19 DIAGNOSIS — D631 Anemia in chronic kidney disease: Secondary | ICD-10-CM | POA: Diagnosis not present

## 2022-04-19 HISTORY — DX: Peptic ulcer, site unspecified, unspecified as acute or chronic, without hemorrhage or perforation: K27.9

## 2022-04-19 HISTORY — DX: Anemia, unspecified: D64.9

## 2022-04-19 HISTORY — DX: Anemia in chronic kidney disease: N18.9

## 2022-04-19 HISTORY — DX: End stage renal disease: Z99.2

## 2022-04-19 HISTORY — DX: Heart failure, unspecified: I50.9

## 2022-04-19 HISTORY — DX: Diaphragmatic hernia without obstruction or gangrene: K44.9

## 2022-04-19 HISTORY — DX: Vitamin D deficiency, unspecified: E55.9

## 2022-04-19 HISTORY — DX: Unspecified kidney failure: N19

## 2022-04-19 HISTORY — DX: Insomnia, unspecified: G47.00

## 2022-04-19 HISTORY — DX: Mild cognitive impairment of uncertain or unknown etiology: G31.84

## 2022-04-19 HISTORY — DX: Cerebral infarction, unspecified: I63.9

## 2022-04-19 HISTORY — DX: Disorder of arteries and arterioles, unspecified: I77.9

## 2022-04-19 HISTORY — DX: Atherosclerotic heart disease of native coronary artery without angina pectoris: I25.10

## 2022-04-19 HISTORY — PX: TRANSURETHRAL RESECTION OF BLADDER TUMOR: SHX2575

## 2022-04-19 HISTORY — DX: Long term (current) use of antithrombotics/antiplatelets: Z79.02

## 2022-04-19 HISTORY — DX: Other intervertebral disc degeneration, lumbar region: M51.36

## 2022-04-19 HISTORY — DX: Type 2 diabetes mellitus without complications: E11.9

## 2022-04-19 HISTORY — DX: Anemia in chronic kidney disease: D63.1

## 2022-04-19 HISTORY — DX: End stage renal disease: N18.6

## 2022-04-19 HISTORY — DX: Shortness of breath: R06.02

## 2022-04-19 HISTORY — DX: Malignant neoplasm of left kidney, except renal pelvis: C64.2

## 2022-04-19 HISTORY — DX: Other intervertebral disc degeneration, lumbar region without mention of lumbar back pain or lower extremity pain: M51.369

## 2022-04-19 HISTORY — DX: Sciatica, unspecified side: M54.30

## 2022-04-19 HISTORY — DX: Anxiety disorder, unspecified: F41.9

## 2022-04-19 HISTORY — DX: Fatty (change of) liver, not elsewhere classified: K76.0

## 2022-04-19 HISTORY — DX: Atherosclerosis of aorta: I70.0

## 2022-04-19 HISTORY — DX: Long term (current) use of insulin: Z79.4

## 2022-04-19 LAB — POCT I-STAT, CHEM 8
BUN: 31 mg/dL — ABNORMAL HIGH (ref 8–23)
Calcium, Ion: 1.15 mmol/L (ref 1.15–1.40)
Chloride: 99 mmol/L (ref 98–111)
Creatinine, Ser: 9.8 mg/dL — ABNORMAL HIGH (ref 0.44–1.00)
Glucose, Bld: 225 mg/dL — ABNORMAL HIGH (ref 70–99)
HCT: 29 % — ABNORMAL LOW (ref 36.0–46.0)
Hemoglobin: 9.9 g/dL — ABNORMAL LOW (ref 12.0–15.0)
Potassium: 3.8 mmol/L (ref 3.5–5.1)
Sodium: 138 mmol/L (ref 135–145)
TCO2: 25 mmol/L (ref 22–32)

## 2022-04-19 SURGERY — TRANSURETHRAL RESECTION OF BLADDER TUMOR (TURBT)
Anesthesia: General | Site: Bladder

## 2022-04-19 MED ORDER — ONDANSETRON HCL 4 MG/2ML IJ SOLN: 4.0000 mg | Freq: Once | INTRAMUSCULAR | Status: DC | PRN

## 2022-04-19 MED ORDER — CEFAZOLIN SODIUM-DEXTROSE 2-4 GM/100ML-% IV SOLN
INTRAVENOUS | Status: AC
  Filled 2022-04-19: qty 100

## 2022-04-19 MED ORDER — OXYBUTYNIN CHLORIDE 5 MG PO TABS: 5.0000 mg | ORAL_TABLET | Freq: Three times a day (TID) | ORAL | 0 refills | Status: DC | PRN

## 2022-04-19 MED ORDER — HYDROCODONE-ACETAMINOPHEN 5-325 MG PO TABS: 1.0000 | ORAL_TABLET | Freq: Four times a day (QID) | ORAL | 0 refills | Status: DC | PRN

## 2022-04-19 MED ORDER — FENTANYL CITRATE (PF) 100 MCG/2ML IJ SOLN
INTRAMUSCULAR | Status: AC
  Filled 2022-04-19: qty 2

## 2022-04-19 MED ORDER — PROPOFOL 10 MG/ML IV BOLUS
INTRAVENOUS | Status: DC | PRN
  Administered 2022-04-19: 50 mg via INTRAVENOUS
  Administered 2022-04-19: 100 mg via INTRAVENOUS
  Administered 2022-04-19: 50 mg via INTRAVENOUS

## 2022-04-19 MED ORDER — IOHEXOL 180 MG/ML  SOLN: INTRAMUSCULAR | Status: DC | PRN

## 2022-04-19 MED ORDER — SODIUM CHLORIDE 0.9 % IR SOLN
Status: DC | PRN
  Administered 2022-04-19: 27000 mL

## 2022-04-19 MED ORDER — FENTANYL CITRATE (PF) 100 MCG/2ML IJ SOLN: 25.0000 ug | INTRAMUSCULAR | Status: DC | PRN

## 2022-04-19 MED ORDER — DEXAMETHASONE SODIUM PHOSPHATE 10 MG/ML IJ SOLN
INTRAMUSCULAR | Status: DC | PRN
  Administered 2022-04-19: 5 mg via INTRAVENOUS

## 2022-04-19 MED ORDER — ACETAMINOPHEN 10 MG/ML IV SOLN
INTRAVENOUS | Status: AC
  Filled 2022-04-19: qty 100

## 2022-04-19 MED ORDER — FENTANYL CITRATE (PF) 100 MCG/2ML IJ SOLN
INTRAMUSCULAR | Status: DC | PRN
  Administered 2022-04-19 (×3): 50 ug via INTRAVENOUS

## 2022-04-19 MED ORDER — LIDOCAINE HCL (CARDIAC) PF 100 MG/5ML IV SOSY
PREFILLED_SYRINGE | INTRAVENOUS | Status: DC | PRN
  Administered 2022-04-19: 100 mg via INTRAVENOUS

## 2022-04-19 MED ORDER — PHENYLEPHRINE 80 MCG/ML (10ML) SYRINGE FOR IV PUSH (FOR BLOOD PRESSURE SUPPORT)
PREFILLED_SYRINGE | INTRAVENOUS | Status: DC | PRN
  Administered 2022-04-19: 160 ug via INTRAVENOUS
  Administered 2022-04-19: 80 ug via INTRAVENOUS
  Administered 2022-04-19: 160 ug via INTRAVENOUS
  Administered 2022-04-19: 80 ug via INTRAVENOUS
  Administered 2022-04-19: 160 ug via INTRAVENOUS

## 2022-04-19 MED ORDER — LACTATED RINGERS IV SOLN: INTRAVENOUS | Status: DC | PRN

## 2022-04-19 MED ORDER — CHLORHEXIDINE GLUCONATE 0.12 % MT SOLN
OROMUCOSAL | Status: AC
  Administered 2022-04-19: 15 mL via OROMUCOSAL
  Filled 2022-04-19: qty 15

## 2022-04-19 MED ORDER — ONDANSETRON HCL 4 MG/2ML IJ SOLN
INTRAMUSCULAR | Status: DC | PRN
  Administered 2022-04-19: 4 mg via INTRAVENOUS

## 2022-04-19 MED ORDER — ACETAMINOPHEN 10 MG/ML IV SOLN
INTRAVENOUS | Status: DC | PRN
  Administered 2022-04-19: 1000 mg via INTRAVENOUS

## 2022-04-19 SURGICAL SUPPLY — 31 items
BAG DRAIN SIEMENS DORNER NS (MISCELLANEOUS) ×4 IMPLANT
BAG DRN NS LF (MISCELLANEOUS) ×3
BRUSH SCRUB EZ 1% IODOPHOR (MISCELLANEOUS) ×4 IMPLANT
CNTNR URN SCR LID CUP LEK RST (MISCELLANEOUS) ×3 IMPLANT
CONT SPEC 4OZ STRL OR WHT (MISCELLANEOUS) ×3
DRAPE UTILITY 15X26 TOWEL STRL (DRAPES) ×4 IMPLANT
DRSG TELFA 3X4 N-ADH STERILE (GAUZE/BANDAGES/DRESSINGS) ×4 IMPLANT
ELECT COAG BIPOLAR CYL 1.2MMM (ELECTROSURGICAL) ×3
ELECT COAG MONO 22-24F ROLLER (MISCELLANEOUS)
ELECT LOOP 22F BIPOLAR SML (ELECTROSURGICAL) ×3
ELECT REM PT RETURN 9FT ADLT (ELECTROSURGICAL)
ELECTRODE COAG BIPLR CYL 1.2MM (ELECTROSURGICAL) IMPLANT
ELECTRODE COAG MONO 22-24F RLR (MISCELLANEOUS) IMPLANT
ELECTRODE LOOP 22F BIPOLAR SML (ELECTROSURGICAL) IMPLANT
ELECTRODE REM PT RTRN 9FT ADLT (ELECTROSURGICAL) IMPLANT
GLOVE BIO SURGEON STRL SZ 6.5 (GLOVE) ×4 IMPLANT
GOWN STRL REUS W/ TWL LRG LVL3 (GOWN DISPOSABLE) ×8 IMPLANT
GOWN STRL REUS W/TWL LRG LVL3 (GOWN DISPOSABLE) ×6
GUIDEWIRE STR DUAL SENSOR (WIRE) ×4 IMPLANT
IV NS IRRIG 3000ML ARTHROMATIC (IV SOLUTION) ×28 IMPLANT
KIT TURNOVER CYSTO (KITS) ×4 IMPLANT
LOOP CUT BIPOLAR 24F LRG (ELECTROSURGICAL) IMPLANT
NDL SAFETY ECLIP 18X1.5 (MISCELLANEOUS) ×4 IMPLANT
PACK CYSTO AR (MISCELLANEOUS) ×4 IMPLANT
PAD ARMBOARD 7.5X6 YLW CONV (MISCELLANEOUS) ×4 IMPLANT
SET CYSTO W/LG BORE CLAMP LF (SET/KITS/TRAYS/PACK) ×4 IMPLANT
SURGILUBE 2OZ TUBE FLIPTOP (MISCELLANEOUS) ×4 IMPLANT
SYR TOOMEY IRRIG 70ML (MISCELLANEOUS) ×3
SYRINGE TOOMEY IRRIG 70ML (MISCELLANEOUS) ×1 IMPLANT
TRAP FLUID SMOKE EVACUATOR (MISCELLANEOUS) ×4 IMPLANT
WATER STERILE IRR 1000ML POUR (IV SOLUTION) ×4 IMPLANT

## 2022-04-19 NOTE — Op Note (Signed)
Date of procedure: 04/19/22  Preoperative diagnosis:  Bladder tumor, bladder neck and trigone  Postoperative diagnosis:  Same as above  Procedure: TURBT, large  Surgeon: Hollice Espy, MD  Anesthesia: General  Complications: None  Intraoperative findings: Very large, greater than 5 cm tumor involving the bladder neck, entirety of the trigone which was necrotic and nodular.  Subtotal resection with residual tumor in.  Bimanual exam highly concerning for T3 disease, nodularity palpable in the anterior vaginal wall.  Unable to identify anatomic structures of the trigone, unable to perform bilateral retrogrades or identify UOs.  EBL: Minimal  Specimens: Bladder tumor  Drains: None  Indication: Megan [name] is a 82 y.o. patient with .  After reviewing the management options for treatment, he elected to proceed with the above surgical procedure(s). We have discussed the potential benefits and risks of the procedure, side effects of the proposed treatment, the likelihood of the patient achieving the goals of the procedure, and any potential problems that might occur during the procedure or recuperation. Informed consent has been obtained.  Description of procedure:  The patient was taken to the operating room and general anesthesia was induced.  The patient was placed in the dorsal lithotomy position, prepped and draped in the usual sterile fashion, and preoperative antibiotics were administered. A preoperative time-out was performed.   A 26 French resectoscope using blunt angled obturator was introduced per urethra.  Immediately upon entering the bladder, there was a very large nodular tumor identified just within the bladder neck extending from at least the 3:00 to 9 o'clock position invading the trigone.  At first, this appeared to be consistent with a bladder tumor on closer inspection, there was areas where the tumor was nodular and had a mucosal surface which was somewhat more  suspicious for infiltrative or metastatic tumor rather than primary TCC.  There were no appreciable anatomic landmarks with the help identify the trigone.  Bilateral ureteral orifice ease were never able to be identified.  I brought in a bipolar loop and began resecting the tumor.  The tumor was hypervascular to some areas and necrotic and almost a fluffy like appearance in other areas.  There was 1 area of deeper resection and upon resecting in this area, I never did get down to a normal mucosal level.  At this point, I repeated the bimanual exam and nodular tumor was felt at the bladder neck to the vagina which was highly suspicious for T3 disease.  Based on the intraoperative findings as well as physical exam, I was highly suspicious that the tumor had either completely replaced the mucosa or was invading well beyond this.  I did not feel that complete resection would be possible.  As such, it due to the majority of the tumor at least down to 1 level and ended up using a rollerball on the surface of the tumor for hemostasis.  The tumor chips were evacuated from the bladder.  I spent quite a bit of time visualizing in and around right felt that the ureteral orifice ease would be however was never able to identify any anatomy consistent with this as the area appeared to be completely infiltrated.  I was never able to do retrograde pyelograms or place a ureteral stent.  After reasonable hemostasis was achieved, I elected to abort the remainder of the TURBT.  She was cleaned and dried, repositioned in supine position, or some incision taken the back in stable condition.  I discussed intraoperative findings as well  as the concerning exam findings with the patient's daughter.  Will use her pathology to guide further management whether this is for additional resection via staged TURBT, further imaging with staging or PET scan or additional treatments.  Plan: We will have her follow-up next week.  Hollice Espy,  M.D.

## 2022-04-19 NOTE — Transfer of Care (Signed)
Immediate Anesthesia Transfer of Care Note  Patient: Megan [name]  Procedure(s) Performed: TRANSURETHRAL RESECTION OF BLADDER TUMOR (TURBT) (Bladder)  Patient Location: PACU  Anesthesia Type:General  Level of Consciousness: awake  Airway & Oxygen Therapy: Patient Spontanous Breathing and Patient connected to face mask oxygen  Post-op Assessment: Report given to RN and Post -op Vital signs reviewed and stable  Post vital signs: Reviewed  Last Vitals:  Vitals Value Taken Time  BP 136/81   Temp 72F   Pulse 78 04/19/22 1251  Resp 15 04/19/22 1251  SpO2 100 % 04/19/22 1251  Vitals shown include unvalidated device data.  Last Pain:  Vitals:   04/19/22 0839  TempSrc: Temporal  PainSc: 0-No pain         Complications: No notable events documented.

## 2022-04-19 NOTE — Discharge Instructions (Addendum)
Transurethral Resection of Bladder Tumor (TURBT) or Bladder Biopsy   Definition:  Transurethral Resection of the Bladder Tumor is a surgical procedure used to diagnose and remove tumors within the bladder. TURBT is the most common treatment for early stage bladder cancer.  General instructions:     Your recent bladder surgery requires very little post hospital care but some definite precautions.  Despite the fact that no skin incisions were used, the area around the bladder incisions are raw and covered with scabs to promote healing and prevent bleeding. Certain precautions are needed to insure that the scabs are not disturbed over the next 2-4 weeks while the healing proceeds.  Because the raw surface inside your bladder and the irritating effects of urine you may expect frequency of urination and/or urgency (a stronger desire to urinate) and perhaps even getting up at night more often. This will usually resolve or improve slowly over the healing period. You may see some blood in your urine over the first 6 weeks. Do not be alarmed, even if the urine was clear for a while. Get off your feet and drink lots of fluids until clearing occurs. If you start to pass clots or don't improve call us.  Diet:  You may return to your normal diet immediately. Because of the raw surface of your bladder, alcohol, spicy foods, foods high in acid and drinks with caffeine may cause irritation or frequency and should be used in moderation. To keep your urine flowing freely and avoid constipation, drink plenty of fluids during the day (8-10 glasses). Tip: Avoid cranberry juice because it is very acidic.  Activity:  Your physical activity doesn't need to be restricted. However, if you are very active, you may see some blood in the urine. We suggest that you reduce your activity under the circumstances until the bleeding has stopped.  Bowels:  It is important to keep your bowels regular during the postoperative  period. Straining with bowel movements can cause bleeding. A bowel movement every other day is reasonable. Use a mild laxative if needed, such as milk of magnesia 2-3 tablespoons, or 2 Dulcolax tablets. Call if you continue to have problems. If you had been taking narcotics for pain, before, during or after your surgery, you may be constipated. Take a laxative if necessary.    Medication:  You should resume your pre-surgery medications unless told not to. In addition you may be given an antibiotic to prevent or treat infection. Antibiotics are not always necessary. All medication should be taken as prescribed until the bottles are finished unless you are having an unusual reaction to one of the drugs.   [facility] , [place] [address] [phone]    AMBULATORY SURGERY  DISCHARGE INSTRUCTIONS   The drugs that you were given will stay in your system until tomorrow so for the next 24 hours you should not:  Drive an automobile Make any legal decisions Drink any alcoholic beverage   You may resume regular meals tomorrow.  Today it is better to start with liquids and gradually work up to solid foods.  You may eat anything you prefer, but it is better to start with liquids, then soup and crackers, and gradually work up to solid foods.   Please notify your doctor immediately if you have any unusual bleeding, trouble breathing, redness and pain at the surgery site, drainage, fever, or pain not relieved by medication.    Additional Instructions:        Please contact   your physician with any problems or Same Day Surgery at [phone], Monday through Friday 6 am to 4 pm, or [place] [facility] [place] Main number at [phone].   

## 2022-04-19 NOTE — Interval H&P Note (Signed)
History and Physical Interval Note:  04/19/2022 10:45 AM  Megan [name]  has presented today for surgery, with the diagnosis of Bladder Tumor.  The various methods of treatment have been discussed with the patient and family. After consideration of risks, benefits and other options for treatment, the patient has consented to  Procedure(s): TRANSURETHRAL RESECTION OF BLADDER TUMOR (TURBT) (N/A) CYSTOSCOPY WITH RETROGRADE PYELOGRAM (Bilateral) BLADDER INSTILLATION OF GEMCITABINE (N/A) as a surgical intervention.  The patient's history has been reviewed, patient examined, no change in status, stable for surgery.  I have reviewed the patient's chart and labs.  Questions were answered to the patient's satisfaction.    [facility] CTAB  Hollice Espy

## 2022-04-19 NOTE — Anesthesia Preprocedure Evaluation (Signed)
Anesthesia Evaluation  Patient identified by MRN, date of birth, ID band Patient awake    Reviewed: Allergy & Precautions, H&P , NPO status , Patient's Chart, lab work & pertinent test results  History of Anesthesia Complications Negative for: history of anesthetic complications  Airway Mallampati: III  TM Distance: >3 FB Neck ROM: full    Dental  (+) Chipped, Dental Advidsory Given   Pulmonary shortness of breath (for over a year, followed by Pulmonology), with exertion and at rest, asthma , neg sleep apnea, COPD, neg recent URI, former smoker          Cardiovascular hypertension, (-) angina + CAD and +CHF  (-) Past MI (-) dysrhythmias   10/25/18: normal NM perfusion study   Neuro/Psych neg Seizures PSYCHIATRIC DISORDERS Anxiety     CVA    GI/Hepatic Neg liver ROS,GERD  Controlled,,  Endo/Other  diabetes, Insulin Dependent  Morbid obesity  Renal/GU ESRFRenal disease     Musculoskeletal   Abdominal   Peds  Hematology  (+) Blood dyscrasia, anemia   Anesthesia Other Findings Past Medical History: No date: Adenomatous colon polyp No date: Allergic rhinitis No date: Asthma No date: Cancer [facility])     Comment:  kidney No date: COPD (chronic obstructive pulmonary disease) (HCC) No date: Diabetes mellitus type II No date: Diverticulosis of colon No date: Esophageal stricture No date: Fatty liver No date: Gastritis No date: GERD (gastroesophageal reflux disease) No date: Gout No date: History of chickenpox 2004: History of CVA (cerebrovascular accident) No date: Hyperlipidemia No date: Hypertension No date: Internal hemorrhoids 11/2012: Kidney tumor 2004: Loss of smell     Comment:  due to CVA No date: Periumbilical hernia No date: Renal insufficiency No date: Umbilical hernia  Past Surgical History: No date: ABDOMINAL HYSTERECTOMY No date: ABDOMINAL HYSTERECTOMY No date: APPENDECTOMY No date: CESAREAN  SECTION     Comment:  x 2 07/23/2015: COLONOSCOPY WITH PROPOFOL; N/A     Comment:  Procedure: COLONOSCOPY WITH PROPOFOL;  Surgeon: Manya Silvas, MD;  Location: [place];  Service:               Endoscopy;  Laterality: N/A; 07/23/2015: ESOPHAGOGASTRODUODENOSCOPY (EGD) WITH PROPOFOL; N/A     Comment:  Procedure: ESOPHAGOGASTRODUODENOSCOPY (EGD) WITH               PROPOFOL;  Surgeon: Manya Silvas, MD;  Location: [facility]              ENDOSCOPY;  Service: Endoscopy;  Laterality: N/A; No date: HEMORROIDECTOMY No date: KIDNEY SURGERY  BMI    Body Mass Index: 36.67 kg/m      Reproductive/Obstetrics negative OB ROS                             Anesthesia Physical Anesthesia Plan  ASA: 3  Anesthesia Plan: General   Post-op Pain Management:    Induction: Intravenous  PONV Risk Score and Plan: Ondansetron, Dexamethasone and Treatment may vary due to age or medical condition  Airway Management Planned: LMA and Oral ETT  Additional Equipment:   Intra-op Plan:   Post-operative Plan: Extubation in OR  Informed Consent: I have reviewed the patients History and Physical, chart, labs and discussed the procedure including the risks, benefits and alternatives for the proposed anesthesia with the patient or authorized representative who has indicated his/her understanding and acceptance.  Dental Advisory Given  Plan Discussed with: Anesthesiologist  Anesthesia Plan Comments:         Anesthesia Quick Evaluation

## 2022-04-19 NOTE — Anesthesia Procedure Notes (Signed)
Procedure Name: LMA Insertion Date/Time: 04/19/2022 11:26 AM  Performed by: Otho Perl, CRNAPre-anesthesia Checklist: Patient identified, Patient being monitored, Timeout performed, Emergency Drugs available and Suction available Patient Re-evaluated:Patient Re-evaluated prior to induction Oxygen Delivery Method: Circle system utilized Preoxygenation: Pre-oxygenation with 100% oxygen Induction Type: IV induction Ventilation: Mask ventilation without difficulty LMA: LMA inserted LMA Size: 4.0 Tube type: Oral Number of attempts: 1 Placement Confirmation: positive ETCO2 and breath sounds checked- equal and bilateral Tube secured with: Tape Dental Injury: Teeth and Oropharynx as per pre-operative assessment

## 2022-04-20 ENCOUNTER — Other Ambulatory Visit: Payer: Self-pay | Admitting: Anatomic Pathology & Clinical Pathology

## 2022-04-20 LAB — SURGICAL PATHOLOGY

## 2022-04-20 NOTE — Anesthesia Postprocedure Evaluation (Signed)
Anesthesia Post Note  Patient: Megan [name]  Procedure(s) Performed: TRANSURETHRAL RESECTION OF BLADDER TUMOR (TURBT) (Bladder)  Patient location during evaluation: PACU Anesthesia Type: General Level of consciousness: awake and alert Pain management: pain level controlled Vital Signs Assessment: post-procedure vital signs reviewed and stable Respiratory status: spontaneous breathing, nonlabored ventilation, respiratory function stable and patient connected to nasal cannula oxygen Cardiovascular status: blood pressure returned to baseline and stable Postop Assessment: no apparent nausea or vomiting Anesthetic complications: no   No notable events documented.   Last Vitals:  Vitals:   04/19/22 1330 04/19/22 1351  BP: (!) 135/58 139/68  Pulse: 70 77  Resp: 14 20  Temp: 36.7 C (!) 36.3 C  SpO2: 96% 96%    Last Pain:  Vitals:   04/19/22 1351  TempSrc: Temporal  PainSc: 0-No pain                 Martha Clan

## 2022-04-21 ENCOUNTER — Telehealth: Payer: Self-pay | Admitting: Urology

## 2022-04-21 DIAGNOSIS — C675 Malignant neoplasm of bladder neck: Secondary | ICD-10-CM

## 2022-04-21 NOTE — Telephone Encounter (Signed)
Called patient to discuss surgical pathology.  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  Needs CT chest abdomen pelvis unfortunately unable to receive contrast due to renal function.  Will plan to defer her follow-up with me until after her scan is completed.  Please reschedule our postop visit for after the studies completed, hopefully can be expedited.  She is also being presented at tumor board next Thursday.  Hollice Espy, MD

## 2022-04-21 NOTE — Addendum Note (Signed)
Addended by: Kris Mouton on: 04/21/2022 01:23 PM   Modules accepted: Orders

## 2022-04-21 NOTE — Telephone Encounter (Signed)
Spoke to patient and moved her appointment to 05/05/2022. This is after the Ct scan.

## 2022-04-27 ENCOUNTER — Ambulatory Visit: Payer: Medicare Other | Admitting: Urology

## 2022-04-29 ENCOUNTER — Ambulatory Visit
Admission: RE | Admit: 2022-04-29 | Discharge: 2022-04-29 | Disposition: A | Discharge status: Home / Self Care | Payer: Medicare Other | Source: Ambulatory Visit | Attending: Urology | Admitting: Urology

## 2022-04-29 DIAGNOSIS — C675 Malignant neoplasm of bladder neck: Secondary | ICD-10-CM | POA: Insufficient documentation

## 2022-05-05 ENCOUNTER — Ambulatory Visit (INDEPENDENT_AMBULATORY_CARE_PROVIDER_SITE_OTHER): Payer: Medicare Other | Admitting: Urology

## 2022-05-05 VITALS — BP 109/66 | HR 76 | Ht 63.0 in | Wt 204.0 lb

## 2022-05-05 DIAGNOSIS — C675 Malignant neoplasm of bladder neck: Secondary | ICD-10-CM

## 2022-05-05 NOTE — Progress Notes (Signed)
I,Amy L Pierron,acting as a scribe for [name], MD.,have documented all relevant documentation on the behalf of [name], MD,as directed by  [name], MD while in the presence of [name], MD.  05/05/2022 2:23 PM   Megan [name] Jun 13, 1940 [id number]  Referring provider: Barbette Reichmann, MD [address] 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   She was taken to the operating room for incomplete TURBT on 04/19/2022. A very large, at least five centimeter, tumor was identified at the bladder neck with an obstructed appearing trigone. UO's were unable to be visualized. Bimanual exam was also abnormal concerning for possible T3 disease.   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 is differentiation invasive into the muscular is appropriate. There was also a decent CIS.   She underwent staging CT scan on 04/29/2022, which showed moderate rate hydroureteronephrosis, which is stable down to the level of the UVJ present for over a year. She had a low density right lower pole renal lesion and small pulmonary nodules measuring up to 5 mm; felt to be likely benign.  She is accompanied today by a few family members both in person and over the phone. She inquired if the liquid in the dialysis tube or any built up toxins would have caused the cancer.   She and her family reports that due to her pain level she is limited with her daily activities and household chores. She is able to bathe and dress herself. For a short time after the surgery her pain had subsided and she was able to do more chores. Now her pain is back. She  mentioned she wants a second opinion from her previous doctor in [place], Dr. Graciela Husbands.   She said after the surgery her back pain has gone away.   PMH: Past Medical History:  Diagnosis Date   Adenomatous colon polyp    Allergic rhinitis    Anemia of chronic renal failure    Anxiety    Aortic atherosclerosis    Asthma    Bilateral carotid artery disease    a.) carotid doppler 11/22/2013: mild (<50%) BICA   CHF (congestive heart failure)    a.)TTE 04/13/2012: EF 65-70%, triv MR, G1DD; b.) TTE 03/28/2017: EF >55%, mild LAE, triv AR/MR/PR, mild TR, G1DD; c.) TTE 12/30/2017: EF 60-65%, mild MR, G1DD; d.) TTE 07/25/2019: EF 60%, mild LAE, triv MR/TR/PR   Clear cell renal cell carcinoma, left    a.) s/p partial nephrectomy in 2015   COPD (chronic obstructive pulmonary disease)    Coronary artery calcification seen on CT scan    DDD (degenerative disc disease), lumbar    Diverticulosis of colon    Esophageal stricture    ESRD on peritoneal dialysis    Gastritis    GERD (gastroesophageal reflux disease)    Gout    Hepatic steatosis    Hiatal hernia    History of chickenpox    History of CVA (cerebrovascular accident) 2004   a.) presented with hemiparesis with slurred speech; imaging revealed LEFT corona radiata small vessel infarct ; carotid doppler and TTE negative; Tx'd at [facility] in [place]; recovered with rehab  Hyperlipidemia    Hypertension    Insomnia    a.) on hyponotic (zolpidem) PRN   Internal hemorrhoids    Long term current use of antithrombotics/antiplatelets    a.) clopidogrel   Loss of smell 2004   due to CVA   Mild cognitive impairment    Periumbilical hernia    a.) s/p repair 11/2019   PUD (peptic ulcer disease)    Sciatica    SOB (shortness of breath)    TIA (transient ischemic attack) 2017   Type 2 diabetes mellitus treated with insulin    Vitamin D deficiency     Surgical History: Past Surgical History:  Procedure Laterality Date    ABDOMINAL HYSTERECTOMY     ABDOMINAL HYSTERECTOMY     APPENDECTOMY     CAPD INSERTION N/A 01/31/2019   Procedure: LAPAROSCOPIC INSERTION CONTINUOUS AMBULATORY PERITONEAL DIALYSIS  (CAPD) CATHETER;  Surgeon: Annice Needy, MD;  Location: [facility] ORS;  Service: General;  Laterality: N/A;   CESAREAN SECTION     x 2   COLONOSCOPY WITH PROPOFOL N/A 07/23/2015   Procedure: COLONOSCOPY WITH PROPOFOL;  Surgeon: Scot Jun, MD;  Location: [facility] ENDOSCOPY;  Service: Endoscopy;  Laterality: N/A;   ESOPHAGOGASTRODUODENOSCOPY (EGD) WITH PROPOFOL N/A 07/23/2015   Procedure: ESOPHAGOGASTRODUODENOSCOPY (EGD) WITH PROPOFOL;  Surgeon: Scot Jun, MD;  Location: [facility] ENDOSCOPY;  Service: Endoscopy;  Laterality: N/A;   HEMORROIDECTOMY     LAPAROSCOPIC INCISIONAL / UMBILICAL / VENTRAL HERNIA REPAIR N/A 12/17/2019   Procedure: ROBOTIC SURGICAL SYSTEM  INCISIONAL HERNIA REPAIR; Location: [facility]   PARTIAL NEPHRECTOMY Left    TRANSURETHRAL RESECTION OF BLADDER TUMOR N/A 04/19/2022   Procedure: TRANSURETHRAL RESECTION OF BLADDER TUMOR (TURBT);  Surgeon: [name], MD;  Location: [facility] ORS;  Service: Urology;  Laterality: N/A;    Home Medications:  Allergies as of 05/05/2022       Reactions   Crestor [rosuvastatin]    myopathy   Iodinated Contrast Media Other (See Comments)   Nausea and SOB per patient On Dialysisi   Pioglitazone    REACTION: weight gain   Sulfa Antibiotics Hives   REACTION: bLISTERS   Sulfonamide Derivatives    REACTION: bLISTERS   Losartan Swelling   Sulfasalazine Hives   REACTION: bLISTERS        Medication List        Accurate as of May 05, 2022  2:23 PM. If you have any questions, ask your nurse or doctor.          acetaminophen 500 MG tablet Commonly known as: TYLENOL Take 500 mg by mouth every 6 (six) hours as needed.   albuterol 108 (90 Base) MCG/ACT inhaler Commonly known as: VENTOLIN HFA 2 puffs every 6  hours as needed for wheeze/ cough   allopurinol 100 MG tablet Commonly known as: ZYLOPRIM Take 100 mg by mouth daily.   Anoro Ellipta 62.5-25 MCG/ACT Aepb Generic drug: umeclidinium-vilanterol TAKE 1 PUFF BY MOUTH EVERY DAY   atorvastatin 40 MG tablet Commonly known as: LIPITOR Take 40 mg by mouth at bedtime.   calcitRIOL 0.25 MCG capsule Commonly known as: ROCALTROL Take 0.25 mcg by mouth daily.   calcium carbonate 500 MG chewable tablet Commonly known as: TUMS - dosed in mg elemental calcium Chew 1 tablet by mouth daily as needed for indigestion or heartburn.   ergocalciferol 1.25 MG (50000 UT) capsule Commonly known as: VITAMIN D2 Take 50,000 Units by mouth every 30 (thirty) days.  gentamicin cream 0.1 % Commonly known as: GARAMYCIN Apply 1 Application topically daily as needed.   HYDROcodone-acetaminophen 5-325 MG tablet Commonly known as: NORCO/VICODIN Take 1-2 tablets by mouth every 6 (six) hours as needed for moderate pain.   hyoscyamine 0.125 MG SL tablet Commonly known as: LEVSIN SL Place 0.125 mg under the tongue every 6 (six) hours as needed.   insulin aspart 100 UNIT/ML injection Commonly known as: NovoLOG Inject 6 Units into the skin daily before supper. Pens What changed:  when to take this additional instructions   insulin degludec 200 UNIT/ML FlexTouch Pen Commonly known as: TRESIBA Inject 38 Units into the skin daily.   Magnesium 400 MG Tabs Take 1 tablet by mouth daily.   metoprolol tartrate 25 MG tablet Commonly known as: LOPRESSOR Take 12.5 mg by mouth daily.   montelukast 10 MG tablet Commonly known as: SINGULAIR TAKE 1 TABLET BY MOUTH EVERYDAY AT BEDTIME   multivitamin Tabs tablet Take 1 tablet by mouth daily.   mupirocin cream 2 % Commonly known as: BACTROBAN 2 (two) times daily.   nitroGLYCERIN 0.4 MG SL tablet Commonly known as: NITROSTAT Place 0.4 mg under the tongue every 5 (five) minutes as needed for chest pain.    ondansetron 4 MG disintegrating tablet Commonly known as: ZOFRAN-ODT Take 1 tablet (4 mg total) by mouth every 8 (eight) hours as needed.   OneTouch Verio test strip Generic drug: glucose blood 1 each 3 (three) times daily.   oxybutynin 5 MG tablet Commonly known as: DITROPAN Take 1 tablet (5 mg total) by mouth every 8 (eight) hours as needed for bladder spasms.   pantoprazole 40 MG tablet Commonly known as: PROTONIX Take 40 mg by mouth 2 (two) times daily.   Plavix 75 MG tablet Generic drug: clopidogrel TAKE 1 TABLET BY MOUTH ONCE A DAY   potassium chloride 10 MEQ CR capsule Commonly known as: MICRO-K Take 10 mEq by mouth daily.   pregabalin 50 MG capsule Commonly known as: LYRICA Take 50 mg by mouth at bedtime.   Premarin vaginal cream Generic drug: conjugated estrogens Apply one pea-sized amount around the opening of the urethra daily for 2 weeks, then 3 times weekly moving forward.   sevelamer carbonate 800 MG tablet Commonly known as: RENVELA Take 1,600 mg by mouth 2 (two) times daily.   spironolactone 25 MG tablet Commonly known as: ALDACTONE Take 25 mg by mouth daily.   torsemide 100 MG tablet Commonly known as: DEMADEX Take 100 mg by mouth daily.   zolpidem 5 MG tablet Commonly known as: AMBIEN TAKE 1 TABLET BY MOUTH AT BEDTIME AS NEEDED        Allergies:  Allergies  Allergen Reactions   Crestor [Rosuvastatin]     myopathy   Iodinated Contrast Media Other (See Comments)    Nausea and SOB per patient On Dialysisi   Pioglitazone     REACTION: weight gain   Sulfa Antibiotics Hives    REACTION: bLISTERS   Sulfonamide Derivatives     REACTION: bLISTERS   Losartan Swelling   Sulfasalazine Hives    REACTION: bLISTERS    Family History: Family History  Problem Relation Age of Onset   GER disease Father    Heart failure Father    Colon polyps Father    Ovarian cancer Mother    Colon cancer Other        aunt   Diabetes Other    Colon  polyps Other  aunt   CAD Neg Hx     Social History:  reports that she quit smoking about 20 years ago. Her smoking use included cigarettes. She has a 30.00 pack-year smoking history. She has never been exposed to tobacco smoke. She has never used smokeless tobacco. She reports that she does not drink alcohol and does not use drugs.   Physical Exam: BP 109/66   Pulse 76   Ht  (1.6 m)   Wt 204 lb (92.5 kg)   BMI 36.14 kg/m   Constitutional:  Alert and oriented, No acute distress.  In wheelchair but spry. HEENT: [place] AT, moist mucus membranes.  Trachea midline, no masses. Neurologic: Grossly intact, no focal deficits, moving all 4 extremities. Psychiatric: Normal mood and affect.   Pertinent Imaging:  SURGICAL PATHOLOGY  CASE: [id number] PATIENT: Megan [name]  Specimen Submitted: A. Bladder tumor  Clinical History: Bladder tumor  DIAGNOSIS: A. 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 - ADJACENT UROTHELIAL CARCINOMA IN SITU.  GROSS DESCRIPTION: A. Labeled: Bladder tumor Received: Fresh Collection time: 12:32 PM on 04/19/2022 Placed into formalin time: 12:38 PM on 04/19/2022 Tissue fragment(s): Multiple Size: Aggregate, 5.0 x 2.5 x 2.5 cm Description: Received are tan-brown to pink soft tissue fragments and blood clot Entirely submitted in 10 cassettes.  CM 04/19/2022  Final Diagnosis performed by Katherine Mantle, MD.   Electronically signed 04/20/2022 11:22:25AM  Personally reviewed and discussed results with the pathologist.      IMPRESSION: 1. No evidence for metastatic disease in the chest, abdomen, or pelvis. 2. Moderate right hydroureteronephrosis with ureter dilated down to the UVJ. Bladder is decompressed with wall thickening and irregularity. Gas in the bladder lumen is likely related to recent instrumentation. In the absence of recent  instrumentation, bladder infection would be a distinct consideration. 3. 12 mm subcapsular low-density lesion interpolar right kidney is progressive in the interval. This is most likely a cyst. Continued attention on restaging studies recommended. 4. Moderate hiatal hernia. 5. Colonic diverticulosis without diverticulitis. 6. Small supraumbilical midline ventral hernia contains only fat. 7. Small bilateral pulmonary nodules measuring up to 5 mm. While likely benign, continued attention on restaging exam suggested. 8. Aortic Atherosclerosis (ICD10-I70.0) and Emphysema (ICD10-J43.9).     Electronically Signed   By: Kennith Center M.D.  CT chest abdomen pelvis was personally reviewed.  Postsurgical changes appreciated, likely has obstruction of her right distal ureter from malignancy.  Assessment & Plan:    Bladder cancer  - Newly diagnosed. At least T2, probably T3 tcc high-grade with squamous differentiation. Metastatic workup is negative, although in non-contrast study.   - She and her family are curious about causes, mentioned there are various reasons to develop bladder cancer including recurrent infections, extended catheter use, or inflammation of the bladder. In her case none of these reasons seem to pinpoint the cause of hers.  - We discussed given squamous differentiation and her end stage renal disease, chemo and radiation is an inferior treatment option. The family seemed pleased with this since they are aware of adverse side effects to chemo.  - She's pending tumor board discussion on Thursday. Will call her back with our discussion results.   - Have already briefly discussed the case with Dr. Fran Lowes. If she's deemed a surgical candidate he may consider cystectomy and bilateral nephrectomy especially in light of her history of bilateral renal masses and end-stage renal disease to avoid need for ostomy and  reconstruction. Referral given to see him as soon as possible for a  consultation.  - Given that she's in end stage renal disease, she'll likely need to transition from peritoneal dialysis to hemodialysis.  Send a message to her nephrologist, Dr. Wynelle Link to make him aware of our discussion today and help facilitate transition to hemodialysis if she is deemed a surgical candidate.  - Her functional status is reasonably good. She's ambulatory. She showers and bathes by herself and can cook small meals. She recently has acquired some assistance at home since she's developed back pain, abdominal pain, presumably from her malignancy.  - Kidney transplant is not an option due to her cancer and not able to be on immunosuppression medication; this was a question brought up by one of her daughters  - She expressed the desire to see her previous urologist in [place] to obtain a second opinion. Explained if he uses Epic he can see all her records. Printed her pathology report and CT scan report for them to have.   - All questions were answered. The family will continue to discuss the options. They will make an appointment with Dr. Berneice Heinrich.   I spent more than 50 total minutes on the day of the encounter including pre-visit review of the medical record, face-to-face time with the patient, and post visit ordering of labs/imaging/tests.  I have reviewed the above documentation for accuracy and completeness, and I agree with the above.   [name], MD   [facility] [address] [place], [place] [address] [phone]

## 2022-05-14 NOTE — Telephone Encounter (Signed)
CCM Time logged and documented.

## 2022-05-20 ENCOUNTER — Telehealth: Payer: Self-pay | Admitting: Urology

## 2022-05-20 NOTE — Telephone Encounter (Signed)
Pt called office to let you know she has decided to have her surgery in [place].  She said she went for a 2nd opinion.

## 2022-05-26 ENCOUNTER — Emergency Department: Payer: No Typology Code available for payment source

## 2022-05-26 ENCOUNTER — Inpatient Hospital Stay
Admission: EM | Admit: 2022-05-26 | Discharge: 2022-06-04 | DRG: 871 | Disposition: A | Discharge status: Home Health | Payer: No Typology Code available for payment source | Attending: Pediatrics | Admitting: Pediatrics

## 2022-05-26 DIAGNOSIS — Z789 Other specified health status: Secondary | ICD-10-CM

## 2022-05-26 DIAGNOSIS — R0602 Shortness of breath: Secondary | ICD-10-CM

## 2022-05-26 DIAGNOSIS — K5900 Constipation, unspecified: Secondary | ICD-10-CM | POA: Diagnosis present

## 2022-05-26 DIAGNOSIS — Z8673 Personal history of transient ischemic attack (TIA), and cerebral infarction without residual deficits: Secondary | ICD-10-CM

## 2022-05-26 DIAGNOSIS — E1165 Type 2 diabetes mellitus with hyperglycemia: Secondary | ICD-10-CM | POA: Diagnosis not present

## 2022-05-26 DIAGNOSIS — I959 Hypotension, unspecified: Secondary | ICD-10-CM | POA: Diagnosis present

## 2022-05-26 DIAGNOSIS — D509 Iron deficiency anemia, unspecified: Secondary | ICD-10-CM | POA: Diagnosis present

## 2022-05-26 DIAGNOSIS — N1330 Unspecified hydronephrosis: Secondary | ICD-10-CM | POA: Diagnosis present

## 2022-05-26 DIAGNOSIS — M109 Gout, unspecified: Secondary | ICD-10-CM | POA: Diagnosis present

## 2022-05-26 DIAGNOSIS — K219 Gastro-esophageal reflux disease without esophagitis: Secondary | ICD-10-CM | POA: Diagnosis present

## 2022-05-26 DIAGNOSIS — E872 Acidosis, unspecified: Secondary | ICD-10-CM | POA: Diagnosis present

## 2022-05-26 DIAGNOSIS — Z87891 Personal history of nicotine dependence: Secondary | ICD-10-CM

## 2022-05-26 DIAGNOSIS — D631 Anemia in chronic kidney disease: Secondary | ICD-10-CM | POA: Diagnosis present

## 2022-05-26 DIAGNOSIS — E1122 Type 2 diabetes mellitus with diabetic chronic kidney disease: Secondary | ICD-10-CM | POA: Diagnosis present

## 2022-05-26 DIAGNOSIS — Y812 Prosthetic and other implants, materials and accessory general- and plastic-surgery devices associated with adverse incidents: Secondary | ICD-10-CM | POA: Diagnosis present

## 2022-05-26 DIAGNOSIS — N390 Urinary tract infection, site not specified: Secondary | ICD-10-CM | POA: Diagnosis present

## 2022-05-26 DIAGNOSIS — A419 Sepsis, unspecified organism: Principal | ICD-10-CM | POA: Diagnosis present

## 2022-05-26 DIAGNOSIS — E43 Unspecified severe protein-calorie malnutrition: Secondary | ICD-10-CM | POA: Diagnosis present

## 2022-05-26 DIAGNOSIS — C679 Malignant neoplasm of bladder, unspecified: Secondary | ICD-10-CM | POA: Diagnosis present

## 2022-05-26 DIAGNOSIS — E785 Hyperlipidemia, unspecified: Secondary | ICD-10-CM | POA: Diagnosis present

## 2022-05-26 DIAGNOSIS — I120 Hypertensive chronic kidney disease with stage 5 chronic kidney disease or end stage renal disease: Secondary | ICD-10-CM | POA: Diagnosis present

## 2022-05-26 DIAGNOSIS — N261 Atrophy of kidney (terminal): Secondary | ICD-10-CM | POA: Diagnosis present

## 2022-05-26 DIAGNOSIS — Z882 Allergy status to sulfonamides status: Secondary | ICD-10-CM

## 2022-05-26 DIAGNOSIS — Z6834 Body mass index (BMI) 34.0-34.9, adult: Secondary | ICD-10-CM

## 2022-05-26 DIAGNOSIS — Z794 Long term (current) use of insulin: Secondary | ICD-10-CM

## 2022-05-26 DIAGNOSIS — E669 Obesity, unspecified: Secondary | ICD-10-CM | POA: Diagnosis present

## 2022-05-26 DIAGNOSIS — Z1152 Encounter for screening for COVID-19: Secondary | ICD-10-CM

## 2022-05-26 DIAGNOSIS — R197 Diarrhea, unspecified: Secondary | ICD-10-CM | POA: Diagnosis not present

## 2022-05-26 DIAGNOSIS — Z992 Dependence on renal dialysis: Secondary | ICD-10-CM

## 2022-05-26 DIAGNOSIS — K659 Peritonitis, unspecified: Principal | ICD-10-CM | POA: Diagnosis present

## 2022-05-26 DIAGNOSIS — Z90710 Acquired absence of both cervix and uterus: Secondary | ICD-10-CM

## 2022-05-26 DIAGNOSIS — Z7902 Long term (current) use of antithrombotics/antiplatelets: Secondary | ICD-10-CM

## 2022-05-26 DIAGNOSIS — R319 Hematuria, unspecified: Secondary | ICD-10-CM | POA: Diagnosis present

## 2022-05-26 DIAGNOSIS — Z85528 Personal history of other malignant neoplasm of kidney: Secondary | ICD-10-CM

## 2022-05-26 DIAGNOSIS — T85611A Breakdown (mechanical) of intraperitoneal dialysis catheter, initial encounter: Secondary | ICD-10-CM | POA: Diagnosis present

## 2022-05-26 DIAGNOSIS — Z905 Acquired absence of kidney: Secondary | ICD-10-CM

## 2022-05-26 DIAGNOSIS — N186 End stage renal disease: Secondary | ICD-10-CM | POA: Diagnosis present

## 2022-05-26 DIAGNOSIS — Z79899 Other long term (current) drug therapy: Secondary | ICD-10-CM

## 2022-05-26 LAB — HIGH SENSITIVITY TROPONIN-I WITH DELTA
hs Troponin-I Delta: -2.2 ng/L
hs Troponin-I: 9.6 ng/L

## 2022-05-26 LAB — COMPREHENSIVE METABOLIC PANEL
ALT: 13 U/L (ref 0–55)
AST (SGOT): 18 U/L (ref 5–41)
Albumin/Globulin Ratio: 0.8 — ABNORMAL LOW (ref 0.9–2.2)
Albumin: 3.2 g/dL — ABNORMAL LOW (ref 3.5–5.0)
Alkaline Phosphatase: 78 U/L (ref 37–117)
Anion Gap: 18 — ABNORMAL HIGH (ref 5.0–15.0)
BUN: 53 mg/dL — ABNORMAL HIGH (ref 7.0–21.0)
Bilirubin, Total: 0.6 mg/dL (ref 0.2–1.2)
CO2: 25 mEq/L (ref 17–29)
Calcium: 10.5 mg/dL — ABNORMAL HIGH (ref 7.9–10.2)
Chloride: 95 mEq/L — ABNORMAL LOW (ref 99–111)
Creatinine: 13.6 mg/dL — ABNORMAL HIGH (ref 0.4–1.0)
Globulin: 4 g/dL — ABNORMAL HIGH (ref 2.0–3.6)
Glucose: 98 mg/dL (ref 70–100)
Potassium: 5 mEq/L (ref 3.5–5.3)
Protein, Total: 7.2 g/dL (ref 6.0–8.3)
Sodium: 138 mEq/L (ref 135–145)
eGFR: 2.4 mL/min/{1.73_m2} — AB (ref >=60)

## 2022-05-26 LAB — CBC AND DIFFERENTIAL
Absolute NRBC: 0.02 10*3/uL — ABNORMAL HIGH (ref 0.00–0.00)
Basophils Absolute Automated: 0.06 10*3/uL (ref 0.00–0.08)
Basophils Automated: 0.3 %
Eosinophils Absolute Automated: 0.01 10*3/uL (ref 0.00–0.44)
Eosinophils Automated: 0.1 %
Hematocrit: 31.5 % — ABNORMAL LOW (ref 34.7–43.7)
Hgb: 10.2 g/dL — ABNORMAL LOW (ref 11.4–14.8)
Immature Granulocytes Absolute: 0.1 10*3/uL — ABNORMAL HIGH (ref 0.00–0.07)
Immature Granulocytes: 0.6 %
Instrument Absolute Neutrophil Count: 13.03 10*3/uL — ABNORMAL HIGH (ref 1.10–6.33)
Lymphocytes Absolute Automated: 2.74 10*3/uL (ref 0.42–3.22)
Lymphocytes Automated: 15.6 %
MCH: 30.4 pg (ref 25.1–33.5)
MCHC: 32.4 g/dL (ref 31.5–35.8)
MCV: 93.8 fL (ref 78.0–96.0)
MPV: 10.3 fL (ref 8.9–12.5)
Monocytes Absolute Automated: 1.6 10*3/uL — ABNORMAL HIGH (ref 0.21–0.85)
Monocytes: 9.1 %
Neutrophils Absolute: 13.03 10*3/uL — ABNORMAL HIGH (ref 1.10–6.33)
Neutrophils: 74.3 %
Nucleated RBC: 0.1 /100 WBC — ABNORMAL HIGH (ref 0.0–0.0)
Platelets: 284 10*3/uL (ref 142–346)
RBC: 3.36 10*6/uL — ABNORMAL LOW (ref 3.90–5.10)
RDW: 15 % (ref 11–15)
WBC: 17.54 10*3/uL — ABNORMAL HIGH (ref 3.10–9.50)

## 2022-05-26 LAB — ECG 12-LEAD
Atrial Rate: 84 {beats}/min
IHS MUSE NARRATIVE AND IMPRESSION: NORMAL
P Axis: -13 degrees
P-R Interval: 158 ms
Q-T Interval: 370 ms
QRS Duration: 76 ms
QTC Calculation (Bezet): 437 ms
R Axis: -12 degrees
T Axis: 24 degrees
Ventricular Rate: 84 {beats}/min

## 2022-05-26 LAB — COVID-19 (SARS-COV-2) & INFLUENZA  A/B, NAA (ROCHE LIAT)
Influenza A: NOT DETECTED
Influenza B: NOT DETECTED
SARS-CoV-2 Overall Result: NOT DETECTED

## 2022-05-26 LAB — WHOLE BLOOD GLUCOSE POCT
Whole Blood Glucose POCT: 79 mg/dL (ref 70–100)
Whole Blood Glucose POCT: 100 mg/dL (ref 70–100)

## 2022-05-26 LAB — NT-PROBNP: NT-proBNP: 696 pg/mL — ABNORMAL HIGH (ref 0–450)

## 2022-05-26 LAB — HIGH SENSITIVITY TROPONIN-I: hs Troponin-I: 11.8 ng/L

## 2022-05-26 LAB — BODY FLUID CELL COUNT

## 2022-05-26 LAB — LACTIC ACID: Lactic Acid: 2 mmol/L (ref 0.2–2.0)

## 2022-05-26 LAB — LIPASE: Lipase: 23 U/L (ref 8–78)

## 2022-05-26 MED ORDER — DEXTROSE 10 % IV BOLUS
12.5000 g | INTRAVENOUS | Status: DC | PRN
Start: 2022-05-26 — End: 2022-06-04

## 2022-05-26 MED ORDER — VANCOMYCIN PHARMACY TO DOSE PLACEHOLDER
INTRAVENOUS | Status: DC
Start: 2022-05-26 — End: 2022-05-27

## 2022-05-26 MED ORDER — PANTOPRAZOLE SODIUM 40 MG PO TBEC
40.0000 mg | DELAYED_RELEASE_TABLET | Freq: Two times a day (BID) | ORAL | Status: DC
Start: 2022-05-26 — End: 2022-06-04
  Administered 2022-05-26 – 2022-06-04 (×17): 40 mg via ORAL
  Filled 2022-05-26 (×18): qty 1

## 2022-05-26 MED ORDER — UMECLIDINIUM-VILANTEROL 62.5-25 MCG/ACT IN AEPB
1.0000 | INHALATION_SPRAY | Freq: Every morning | RESPIRATORY_TRACT | Status: DC
Start: 2022-05-27 — End: 2022-06-04
  Administered 2022-05-27 – 2022-06-04 (×9): 1 via RESPIRATORY_TRACT
  Filled 2022-05-26 (×2): qty 7

## 2022-05-26 MED ORDER — SALINE SPRAY 0.65 % NA SOLN
2.0000 | NASAL | Status: DC | PRN
Start: 2022-05-26 — End: 2022-06-04

## 2022-05-26 MED ORDER — BENZOCAINE-MENTHOL MT LOZG (WRAP)
1.0000 | LOZENGE | OROMUCOSAL | Status: DC | PRN
Start: 2022-05-26 — End: 2022-06-04

## 2022-05-26 MED ORDER — ACETAMINOPHEN 325 MG PO TABS
650.0000 mg | ORAL_TABLET | Freq: Four times a day (QID) | ORAL | Status: DC | PRN
Start: 2022-05-26 — End: 2022-06-04
  Administered 2022-05-26 – 2022-05-29 (×4): 650 mg via ORAL
  Filled 2022-05-26 (×4): qty 2

## 2022-05-26 MED ORDER — INSULIN LISPRO 100 UNIT/ML SOLN (WRAP)
1.0000 [IU] | Freq: Three times a day (TID) | Status: DC
Start: 2022-05-27 — End: 2022-06-04
  Administered 2022-05-27: 1 [IU] via SUBCUTANEOUS
  Administered 2022-05-28 – 2022-05-30 (×2): 2 [IU] via SUBCUTANEOUS
  Administered 2022-06-04: 1 [IU] via SUBCUTANEOUS
  Filled 2022-05-26 (×2): qty 6
  Filled 2022-05-26: qty 9
  Filled 2022-05-26 (×2): qty 3

## 2022-05-26 MED ORDER — MELATONIN 3 MG PO TABS
3.0000 mg | ORAL_TABLET | Freq: Every evening | ORAL | Status: DC | PRN
Start: 2022-05-26 — End: 2022-06-04
  Administered 2022-06-02: 3 mg via ORAL
  Filled 2022-05-26: qty 1

## 2022-05-26 MED ORDER — ATORVASTATIN CALCIUM 40 MG PO TABS
40.0000 mg | ORAL_TABLET | Freq: Every day | ORAL | Status: DC
Start: 2022-05-27 — End: 2022-06-04
  Administered 2022-05-27 – 2022-06-04 (×9): 40 mg via ORAL
  Filled 2022-05-26 (×9): qty 1

## 2022-05-26 MED ORDER — OXYCODONE HCL 5 MG PO TABS
5.0000 mg | ORAL_TABLET | Freq: Four times a day (QID) | ORAL | Status: DC | PRN
Start: 2022-05-26 — End: 2022-06-04
  Administered 2022-05-27 – 2022-06-04 (×7): 5 mg via ORAL
  Filled 2022-05-26 (×8): qty 1

## 2022-05-26 MED ORDER — CARBOXYMETHYLCELLULOSE SODIUM 0.5 % OP SOLN
1.0000 [drp] | Freq: Three times a day (TID) | OPHTHALMIC | Status: DC | PRN
Start: 2022-05-26 — End: 2022-06-04

## 2022-05-26 MED ORDER — STERILE WATER FOR INJECTION IJ/IV SOLN (WRAP)
1.0000 g | INTRAMUSCULAR | Status: DC
Start: 2022-05-27 — End: 2022-05-29
  Administered 2022-05-27 – 2022-05-28 (×2): 1 g via INTRAVENOUS
  Filled 2022-05-26 (×4): qty 1000

## 2022-05-26 MED ORDER — HEPARIN SODIUM (PORCINE) 5000 UNIT/ML IJ SOLN
5000.0000 [IU] | Freq: Three times a day (TID) | INTRAMUSCULAR | Status: AC
Start: 2022-05-26 — End: 2022-05-31
  Administered 2022-05-26 – 2022-05-31 (×15): 5000 [IU] via SUBCUTANEOUS
  Filled 2022-05-26 (×16): qty 1

## 2022-05-26 MED ORDER — METOPROLOL TARTRATE 12.5 MG PO SPLIT TAB
12.5000 mg | ORAL_TABLET | Freq: Every day | ORAL | Status: DC
Start: 2022-05-27 — End: 2022-06-04
  Administered 2022-05-29 – 2022-06-04 (×5): 12.5 mg via ORAL
  Filled 2022-05-26 (×8): qty 1

## 2022-05-26 MED ORDER — ALLOPURINOL 100 MG PO TABS
100.0000 mg | ORAL_TABLET | Freq: Every day | ORAL | Status: DC
Start: 2022-05-27 — End: 2022-06-04
  Administered 2022-05-27 – 2022-06-04 (×9): 100 mg via ORAL
  Filled 2022-05-26 (×9): qty 1

## 2022-05-26 MED ORDER — ONDANSETRON HCL 4 MG/2ML IJ SOLN
4.0000 mg | Freq: Four times a day (QID) | INTRAMUSCULAR | Status: DC | PRN
Start: 2022-05-26 — End: 2022-06-04
  Administered 2022-05-27 – 2022-06-03 (×6): 4 mg via INTRAVENOUS
  Filled 2022-05-26 (×6): qty 2

## 2022-05-26 MED ORDER — STERILE WATER FOR INJECTION IJ/IV SOLN (WRAP)
2.0000 g | Freq: Once | Status: AC
Start: 2022-05-26 — End: 2022-05-26
  Administered 2022-05-26: 2 g via INTRAVENOUS

## 2022-05-26 MED ORDER — CLOPIDOGREL BISULFATE 75 MG PO TABS
75.0000 mg | ORAL_TABLET | Freq: Every day | ORAL | Status: DC
Start: 2022-05-27 — End: 2022-05-28

## 2022-05-26 MED ORDER — STERILE WATER FOR INJECTION IJ/IV SOLN (WRAP)
1.0000 g | INTRAMUSCULAR | Status: DC
Start: 2022-05-26 — End: 2022-05-26

## 2022-05-26 MED ORDER — OXYBUTYNIN CHLORIDE ER 5 MG PO TB24
5.0000 mg | ORAL_TABLET | Freq: Every day | ORAL | Status: DC
Start: 2022-05-27 — End: 2022-06-04
  Administered 2022-05-27 – 2022-06-04 (×6): 5 mg via ORAL
  Filled 2022-05-26 (×9): qty 1

## 2022-05-26 MED ORDER — GLUCOSE 40 % PO GEL (WRAP)
15.0000 g | ORAL | Status: DC | PRN
Start: 2022-05-26 — End: 2022-06-04

## 2022-05-26 MED ORDER — SEVELAMER CARBONATE 800 MG PO TABS
1600.0000 mg | ORAL_TABLET | Freq: Two times a day (BID) | ORAL | Status: DC
Start: 2022-05-27 — End: 2022-06-04
  Administered 2022-05-27 – 2022-06-04 (×14): 1600 mg via ORAL
  Filled 2022-05-26 (×16): qty 2

## 2022-05-26 MED ORDER — NALOXONE HCL 0.4 MG/ML IJ SOLN (WRAP)
0.2000 mg | INTRAMUSCULAR | Status: DC | PRN
Start: 2022-05-26 — End: 2022-06-04

## 2022-05-26 MED ORDER — INSULIN LISPRO 100 UNIT/ML SOLN (WRAP)
1.0000 [IU] | Freq: Every evening | Status: DC
Start: 2022-05-26 — End: 2022-06-04
  Administered 2022-05-27 – 2022-05-30 (×3): 1 [IU] via SUBCUTANEOUS
  Filled 2022-05-26 (×3): qty 3

## 2022-05-26 MED ORDER — POLYETHYLENE GLYCOL 3350 17 G PO PACK
17.0000 g | PACK | Freq: Every day | ORAL | Status: DC | PRN
Start: 2022-05-26 — End: 2022-06-01
  Administered 2022-05-27 – 2022-05-28 (×2): 17 g via ORAL
  Filled 2022-05-26 (×2): qty 1

## 2022-05-26 MED ORDER — ACETAMINOPHEN 325 MG PO TABS
650.0000 mg | ORAL_TABLET | Freq: Once | ORAL | Status: AC
Start: 2022-05-26 — End: 2022-05-26
  Administered 2022-05-26: 650 mg via ORAL
  Filled 2022-05-26: qty 2

## 2022-05-26 MED ORDER — CINACALCET HCL 30 MG PO TABS
30.0000 mg | ORAL_TABLET | Freq: Every day | ORAL | Status: DC
Start: 2022-05-27 — End: 2022-06-02
  Administered 2022-05-27 – 2022-06-01 (×3): 30 mg via ORAL
  Filled 2022-05-26 (×7): qty 1

## 2022-05-26 MED ORDER — DEXTROSE 50 % IV SOLN
12.5000 g | INTRAVENOUS | Status: DC | PRN
Start: 2022-05-26 — End: 2022-06-04

## 2022-05-26 MED ORDER — STERILE WATER FOR INJECTION IJ/IV SOLN (WRAP)
2.0000 g | Freq: Three times a day (TID) | Status: DC
Start: 2022-05-26 — End: 2022-05-26
  Filled 2022-05-26: qty 2

## 2022-05-26 MED ORDER — HYDROMORPHONE HCL 1 MG/ML IJ SOLN
0.2000 mg | INTRAMUSCULAR | Status: DC | PRN
Start: 2022-05-26 — End: 2022-06-04
  Administered 2022-05-28 – 2022-06-04 (×4): 0.2 mg via INTRAVENOUS
  Filled 2022-05-26 (×4): qty 1

## 2022-05-26 MED ORDER — ONDANSETRON 4 MG PO TBDP
4.0000 mg | ORAL_TABLET | Freq: Four times a day (QID) | ORAL | Status: DC | PRN
Start: 2022-05-26 — End: 2022-06-04
  Administered 2022-05-29 – 2022-06-04 (×4): 4 mg via ORAL
  Filled 2022-05-26 (×4): qty 1

## 2022-05-26 MED ORDER — NEPHRO-VITE 0.8 MG PO TABS
1.0000 | ORAL_TABLET | Freq: Every day | ORAL | Status: DC
Start: 2022-05-27 — End: 2022-06-04
  Administered 2022-05-27 – 2022-06-04 (×9): 1 via ORAL
  Filled 2022-05-26 (×9): qty 1

## 2022-05-26 MED ORDER — BENZONATATE 100 MG PO CAPS
100.0000 mg | ORAL_CAPSULE | Freq: Three times a day (TID) | ORAL | Status: DC | PRN
Start: 2022-05-26 — End: 2022-06-04

## 2022-05-26 MED ORDER — SENNOSIDES-DOCUSATE SODIUM 8.6-50 MG PO TABS
2.0000 | ORAL_TABLET | Freq: Every evening | ORAL | Status: DC
Start: 2022-05-26 — End: 2022-06-01
  Administered 2022-05-27 – 2022-05-28 (×2): 2 via ORAL
  Filled 2022-05-26 (×3): qty 2

## 2022-05-26 MED ORDER — VANCOMYCIN HCL 1 G IV SOLR
20.0000 mg/kg | Freq: Once | INTRAVENOUS | Status: AC
Start: 2022-05-26 — End: 2022-05-26
  Administered 2022-05-26: 1750 mg via INTRAVENOUS
  Filled 2022-05-26: qty 2000

## 2022-05-26 MED ORDER — MONTELUKAST SODIUM 10 MG PO TABS
10.0000 mg | ORAL_TABLET | Freq: Every evening | ORAL | Status: DC
Start: 2022-05-26 — End: 2022-06-04
  Administered 2022-05-26 – 2022-06-03 (×9): 10 mg via ORAL
  Filled 2022-05-26 (×9): qty 1

## 2022-05-26 MED ORDER — POLYSACCHARIDE IRON COMPLEX 150 MG PO CAPS
150.0000 mg | ORAL_CAPSULE | Freq: Every day | ORAL | Status: DC
Start: 2022-05-27 — End: 2022-06-04
  Administered 2022-06-01 – 2022-06-04 (×3): 150 mg via ORAL
  Filled 2022-05-26 (×10): qty 1

## 2022-05-26 MED ORDER — STERILE WATER FOR INJECTION IJ/IV SOLN (WRAP)
1.0000 g | Freq: Three times a day (TID) | INTRAMUSCULAR | Status: DC
Start: 2022-05-26 — End: 2022-05-26

## 2022-05-26 MED ORDER — GLUCAGON 1 MG IJ SOLR (WRAP)
1.0000 mg | INTRAMUSCULAR | Status: DC | PRN
Start: 2022-05-26 — End: 2022-06-04

## 2022-05-26 NOTE — ED Provider Notes (Signed)
EMERGENCY DEPARTMENT HISTORY AND PHYSICAL EXAM     None        Date: 05/26/2022  Patient Name: Shelby Barron  Attending Physician: Kari Baars, MD  Advanced Practice Provider: Aretha Parrot, PA    History of Presenting Illness       History Provided By: Patient and Patient's Daughter  Interpreter: None required     Chief Complaint:  Chief Complaint   Patient presents with    Abdominal Pain    Shortness of Breath    Nausea    Cough         Additional History: Shelby Barron is a 82 y.o. female history of diabetes, hypertension, CVA, kidney presenting to the ED with shortness of breath, abdominal pain, and cough.  Patient states that has been having increased abdominal pain since the weekend while doing peritoneal dialysis.  States that has been the lower part of her abdomen is that shooting pain that it new today and SOB.  Patient does peritoneal dialysis at home however last completed dialysis was on Monday.  Patient states that she was at dialysis clinic today but did not receive dialysis and was instructed to come to the emergency department due to the shortness of breath and abd pain that she was experiencing while there.   Patient states that on 1 April,2024  received a bladder biopsy to determine if she had bladder cancer.  Patient's daughter states that she also recently completed a course of antibiotics sometime last week for UTI.  States has not received results for repeated urine culture from primary care.  Patient states that she is compliant with taking her diabetes medication.  Patient is on Plavix for history of CVA. Patient denies any fever, urinary symptoms, diarrhea, incontinence, back pain, trauma    PCP: Pcp, [place]file, MD  SPECIALISTS:    No current facility-administered medications for this encounter.     Current Outpatient Medications   Medication Sig Dispense Refill    amLODIPine (NORVASC) 10 MG tablet Take 10 mg by mouth daily.      clopidogrel (PLAVIX) 75 MG tablet Take 75 mg by  mouth daily.        Febuxostat (ULORIC PO) Take by mouth.      fluticasone-salmeterol (ADVAIR HFA) 115-21 MCG/ACT inhaler Inhale 2 puffs into the lungs as needed.      furosemide (LASIX) 20 MG tablet Take 20 mg by mouth 2 (two) times daily.        insulin aspart (NOVOLOG) 100 UNIT/ML injection Inject 5-10 Units into the skin every evening.      insulin glargine (LANTUS) 100 UNIT/ML injection Inject 20 Units into the skin every 12 (twelve) hours.      lisinopril (PRINIVIL,ZESTRIL) 5 MG tablet Take 5 mg by mouth daily.      pregabalin (LYRICA) 50 MG capsule Take 50 mg by mouth daily.      Zolpidem Tartrate (AMBIEN PO) Take by mouth.             Past History     Past Medical History:  Past Medical History:   Diagnosis Date    Acid reflux     Asthma, well controlled     Cancer of kidney     Chronic lower back pain     COPD (chronic obstructive pulmonary disease)     CVA (cerebral infarction) Jan 12, 2003    Diabetes     Diabetic nephropathy     Fibroids  Gout     H/O: gout     Hyperlipidemia     Hypertension     Neuropathy of hand     SOB (shortness of breath)        Past Surgical History:  Past Surgical History:   Procedure Laterality Date    APPENDECTOMY (OPEN)      HERNIA REPAIR      HYSTERECTOMY      tumor removed from kidney  cJanuary 2015       Family History:  Family History   Problem Relation Age of Onset    Cancer Mother     Heart disease Father     Cancer Maternal Aunt     Diabetes Maternal Aunt     Cancer Paternal Aunt        Social History:  Social History     Tobacco Use    Smoking status: Former    Smokeless tobacco: Never   Advertising account planner    Vaping status: Never Used   Substance Use Topics    Alcohol use: No    Drug use: No       Allergies:  Allergies   Allergen Reactions    Sulfa Antibiotics Hives       Review of Systems     Review of Systems   HENT:  Positive for congestion.    Respiratory:  Positive for cough.    Gastrointestinal:  Positive for abdominal pain and nausea.   Genitourinary:  Negative for  difficulty urinating and dysuria.   Neurological: Negative.    Psychiatric/Behavioral: Negative.         Physical Exam     Vitals:    05/26/22 1649 05/26/22 1800 05/26/22 1830 05/26/22 1900   BP: 106/52 110/53 101/53 101/50   Pulse: 76 73 74 79   Resp: 19 20 17  (!) 24   Temp:       TempSrc:       SpO2: 98% 100% 96% 97%   Weight:       Height:           Physical Exam  Constitutional:       General: She is not in acute distress.     Appearance: She is not ill-appearing or toxic-appearing.   Cardiovascular:      Rate and Rhythm: Normal rate and regular rhythm.   Pulmonary:      Breath sounds: Examination of the right-lower field reveals decreased breath sounds. Examination of the left-lower field reveals decreased breath sounds. Decreased breath sounds present.   Abdominal:      General: Bowel sounds are normal.      Palpations: There is no fluid wave or pulsatile mass.      Tenderness: There is abdominal tenderness in the left upper quadrant and left lower quadrant.          Comments: Pain dialysis access on left side of abdomen no leaking or drainage noted around the area.   Musculoskeletal:      Right lower leg: No edema.      Left lower leg: No edema.   Neurological:      Mental Status: She is alert.         Diagnostic Study Results     Labs -     Results       Procedure Component Value Units Date/Time    High Sensitivity Troponin-I at 2 hrs with calculated Delta [[id number]] Collected: 05/26/22 1837    Specimen: Blood Updated: 05/26/22  1907     hs Troponin-I 9.6 ng/L      hs Troponin-I Delta -2.2 ng/L     High Sensitivity Troponin-I at 0 hrs [[id number]] Collected: 05/26/22 1526    Specimen: Blood Updated: 05/26/22 1607     hs Troponin-I 11.8 ng/L     Culture Blood Aerobic and Anaerobic [[id number]] Collected: 05/26/22 1604    Specimen: Arm from Blood, Venipuncture Updated: 05/26/22 1604    Narrative:      1 BLUE+1 PURPLE    NT-proBNP [[id number]]  (Abnormal) Collected: 05/26/22 1526     Updated: 05/26/22 1604      NT-proBNP 696 pg/mL     COVID-19 (SARS-CoV-2) and Influenza A/B, NAA ([facility]) [[id number]] Collected: 05/26/22 1535    Specimen: Culturette from Nasopharyngeal Updated: 05/26/22 1601     Purpose of COVID testing Diagnostic -PUI     SARS-CoV-2 Specimen Source Nasal Swab     SARS CoV 2 Overall Result Not Detected     Influenza A Not Detected     Influenza B Not Detected    Narrative:      o Collect and clearly label specimen type:  o PREFERRED-Upper respiratory specimen: One Nasal Swab in  Transport Media.  o Hand deliver to laboratory ASAP  Diagnostic -PUI    Lipase [[id number]] Collected: 05/26/22 1526    Specimen: Blood Updated: 05/26/22 1600     Lipase 23 U/L     Comprehensive metabolic panel [[id number]]  (Abnormal) Collected: 05/26/22 1526    Specimen: Blood Updated: 05/26/22 1600     Glucose 98 mg/dL      BUN 53.6 mg/dL      Creatinine 64.4 mg/dL      Sodium 034 mEq/L      Potassium 5.0 mEq/L      Chloride 95 mEq/L      CO2 25 mEq/L      Calcium 10.5 mg/dL      Protein, Total 7.2 g/dL      Albumin 3.2 g/dL      AST (SGOT) 18 U/L      ALT 13 U/L      Alkaline Phosphatase 78 U/L      Bilirubin, Total 0.6 mg/dL      Globulin 4.0 g/dL      Albumin/Globulin Ratio 0.8     Anion Gap 18.0     eGFR 2.4 mL/min/1.73 m2     Lactic Acid [[id number]] Collected: 05/26/22 1526    Specimen: Blood Updated: 05/26/22 1554     Lactic Acid 2.0 mmol/L     CBC and differential [[id number]]  (Abnormal) Collected: 05/26/22 1526    Specimen: Blood Updated: 05/26/22 1541     WBC 17.54 x10 3/uL      Hgb 10.2 g/dL      Hematocrit 18.8 %      Platelets 284 x10 3/uL      RBC 3.36 x10 6/uL      MCV 93.8 fL      MCH 30.4 pg      MCHC 32.4 g/dL      RDW 15 %      MPV 10.3 fL      Instrument Absolute Neutrophil Count 13.03 x10 3/uL      Neutrophils 74.3 %      Lymphocytes Automated 15.6 %      Monocytes 9.1 %      Eosinophils Automated 0.1 %      Basophils Automated 0.3 %      Immature  Granulocytes 0.6 %      Nucleated RBC 0.1 /100 WBC       Neutrophils Absolute 13.03 x10 3/uL      Lymphocytes Absolute Automated 2.74 x10 3/uL      Monocytes Absolute Automated 1.60 x10 3/uL      Eosinophils Absolute Automated 0.01 x10 3/uL      Basophils Absolute Automated 0.06 x10 3/uL      Immature Granulocytes Absolute 0.10 x10 3/uL      Absolute NRBC 0.02 x10 3/uL     Glucose Whole Blood - POCT [[id number]] Collected: 05/26/22 1513     Updated: 05/26/22 1534     Whole Blood Glucose POCT 79 mg/dL     Culture Blood Aerobic and Anaerobic [[id number]] Collected: 05/26/22 1526    Specimen: Arm from Blood, Venipuncture Updated: 05/26/22 1528    Narrative:      1 BLUE+1 PURPLE            Radiologic Studies -   Radiology Results (24 Hour)       Procedure Component Value Units Date/Time    CT Chest Abdomen Pelvis WO Contrast [[id number]] Collected: 05/26/22 1642    Order Status: Completed Updated: 05/26/22 1649    Narrative:      HISTORY:  Abdominal pain left side    COMPARISON: Magnetic resonance imaging abdomen 07/31/2015    TECHNIQUE: CT of the chest, abdomen, and pelvis performed without  intravenous contrast. The following dose reduction techniques were  utilized: automated exposure control and/or adjustment of the mA and/or KV  according to patient size, and the use of an iterative reconstruction  technique.    CONTRAST: None.    FINDINGS: Vascular, hilar, mediastinal, and organ detail is limited without  IV contrast.  Chest:  The chest demonstrate the lungs to be clear. There is moderate  centrilobular emphysema. There is aortic and coronary artery calcification.  There is no adenopathy or aneurysm. There is a moderate to large hiatal  hernia. There is mild scarring in the lung bases  Abdomen and pelvis:  Liver and spleen are unremarkable. The gallbladder is unremarkable. Low  adrenal glands and pancreas are unremarkable. There is moderate to marked  multifocal scarring of the left kidney. There is pronounced grade 3  right-sided hydronephrosis and pronounced diffuse  ureterectasis. Several  regions of focal scarring in the left kidney are noted and there are  several small isodense hypodense masses in the right kidney which may  represent solid masses or hemorrhagic cyst and these were not well seen on  the prior magnetic resonance imaging.    There is aortic and aortic branch calcification. There are scattered  colonic diverticula. Left-sided peritoneal drainage catheter is seen in  situ. There are scattered colonic diverticula without bowel obstruction or  free fluid. Urinary bladder is decompressed. Prostate is unremarkable    Osseous findings: There are degenerative changes through the skeleton as  visualized without bony destructive change    Small supraumbilical fat-containing hernia is noted      Impression:        1. Grade 3 pronounced right-sided hydronephrosis and diffuse ureterectasis  to the level of the right ureterovesical junction of uncertain etiology.  This is new from prior magnetic resonance imaging  2. Several small masses of the right kidney may represent small solid  masses or complex cysts. These could be further evaluated by renal magnetic  resonance imaging when patient is clinically stable    [name], MD  05/26/2022  4:47 PM    Chest AP Only [[id number]] Collected: 05/26/22 1603    Order Status: Completed Updated: 05/26/22 1605    Narrative:      PORTABLE CHEST    HISTORY: Shortness of breath.     COMPARISON: 01/25/2013.     FINDINGS: The study is limited due to the patients body habitus. The lungs  are clear. The cardiomediastinal silhouette is unremarkable.          Impression:        No acute cardiopulmonary disease.    Fonnie Mu, DO  05/26/2022 4:03 PM        .    Medical Decision Making   I am the first provider for this patient.    I reviewed the vital signs, available nursing notes, past medical history, past surgical history, family history and social history.    Vital Signs-Reviewed the patient's vital signs.   Patient Vitals for the past 12  hrs:   BP Temp Pulse Resp   05/26/22 1900 101/50 -- 79 (!) 24   05/26/22 1830 101/53 -- 74 17   05/26/22 1800 110/53 -- 73 20   05/26/22 1649 106/52 -- 76 19   05/26/22 1530 112/57 -- 79 19   05/26/22 1502 92/60 97.8 F (36.6 C) 84 18       Pulse Oximetry Analysis - Normal SpO2: 98 % on RA    EKG:  Interpreted by the EP.   Time Interpreted: 1520   Rate: 84   Rhythm: Normal Sinus Rhythm    Interpretation:normal   Comparison: no significant change     Procedures:   Procedures       Old Medical Records: Old medical records.       ED Medications  Medications   vancomycin (VANCOCIN) 1,750 mg in sodium chloride 0.9 % 500 mL IVPB (0 mg Intravenous Stopped 05/26/22 1843)   acetaminophen (TYLENOL) tablet 650 mg (650 mg Oral Given 05/26/22 1624)   cefepime (MAXIPIME) 2 g in sterile water (preservative free) 10 mL IV push injection (2 g Intravenous Given 05/26/22 1644)       ED Course:   ED Course as of 05/26/22 1932   Wed May 26, 2022   1542 Spoke with Dr.Sothinathan patient's nephrologist.  She stated that had appointment with patient yesterday and then telemedicine appointment today stated that patient was complaining of pain and bloating after doing peritoneal dialysis but then today started complain about pain and shortness of breath without having done her dialysis for the day.  Was concerned about patient having a peritonitis recommended that patient be started on Vanco and cefepime prophylactically and that patient be admitted to [facility] to receive PD cultures. [VB]   1616 Patient was accepted by Dr.Naseem for admission  [VB]      ED Course User Index  [VB] Aretha Parrot, PA         Provider Notes:     Medical Decision Making  82 year old female with history of diabetes, CVA, HTN, bladder cancer, renal cancer, peritoneal dialysis, presents emergency department for acute abdominal pain with SOB starting today.  While here CT showed new right-sided hydronephrosis and absence of stone, spoke with patient's nephrologist  concern for peritonitis.  Started patient on prophylactic IV  vancomycin 1750 mg and cefepime 1 mg.  Patient will be admitted for peritonitis and further evaluation and monitoring and to receive dialysis since she has not received dialysis since 05-24-22.    Differential diagnosis included bursitis,  renal calculus, DKA, bowel obstruction, intra-abdominal abscess, MI, heart failure, pulm edema    Amount and/or Complexity of Data Reviewed  Labs: ordered.     Details: WBC: 17  BUN 53.0  Creatinine 13.6  Bnp 696  Lactic was 2.0  Trop 11.8  second  9.9 delta -2.2  Pending blood cultures        Radiology: ordered.     Details: CT chest, abdomen, pelvis without contrast  1. Grade 3 pronounced right-sided hydronephrosis and diffuse ureterectasis  to the level of the right ureterovesical junction of uncertain etiology.  This is new from prior magnetic resonance imaging  2. Several small masses of the right kidney may represent small solid  masses or complex cysts. These could be further evaluated by renal magnetic  resonance imaging when patient is clinically stable  Chest x-ray was negative for any acute findings elevated white count to 17      ECG/medicine tests: ordered.     Details: EKG:  Interpreted by the EP.   Time Interpreted: 1520   Rate: 84   Rhythm: Normal Sinus Rhythm    Interpretation:normal   Comparison: no significant change    Risk  OTC drugs.  Decision regarding hospitalization.       The patient/guardian voiced understanding of the return precautions. Discussed most common side effects of medications given. Solicited, welcomed and answered all patient/guardian questions.    D/w Kari Baars, MD      Diagnosis     Clinical Impression:   1. Peritonitis    2. SOB (shortness of breath)        Treatment Plan:   ED Disposition       ED Disposition   Admit    Condition   --    Date/Time   Wed May 26, 2022  4:23 PM    Comment   Admitting Physician: Jonni Sanger [[id number]]   Service:: Medicine [[id number]]   Estimated  Length of Stay: > or = to 2 midnights   Tentative Discharge Plan?: Home or Self Care [1]   Does patient need telemetry?: Yes   Is patient 18 yrs or greater?: Yes   Telemetry type (separate Telemetry order is also required):: Adult telemetry                   **Please note, this document was generated using voice recognition software which was designed to generate medical notes and help improve efficiency in patient care.  Unfortunately, the software does generate grammatical errors and typos.  While this note was reviewed and edited where appropriate, its possible that some of these errors may still be present.        _______________________________    CHART OWNERSHIP: I, Caroline More, PA-C, am the primary clinician of record.  _______________________________         Aretha Parrot, PA  05/26/22 1932       Kari Baars, MD  05/28/22 [phone]

## 2022-05-26 NOTE — Plan of Care (Signed)
Problem: Patient Receiving Advanced Renal Therapies  Goal: Therapy access site remains intact  Flowsheets (Taken 05/26/2022 2325)  Therapy access site remains intact: Assess therapy access site

## 2022-05-26 NOTE — Plan of Care (Addendum)
Consult received.   82 yo F with ESRD on PD who was seen in our PD clinic yesterday and via telehealth today for increasing abdominal pain and shortness of breath.     Imaging notable for:  grade 3 pronounced right -sided hydronephrosis and diffuse ureterectasis to the level of the right UVJ - new finding.   Several small masses  of the right kidney    Recs:   S/p vanc and cefepime this afternoon  Redose cefepime tomorrow  Vanc per level, goal 15-20  Will put her on PD tonight   Plan to send PD fluid cell count and culture - if dry, we will send off the sample in the morning after completing PD  Please, consult urology regarding CT findings    She was recently found to have bladder cancer, plan is to undergo cystectomy and nephrectomy with formation of [place] pouch.     Full consult to follow up in AM.   Plan discussed with Dr. Reinaldo Meeker and HD RN.   Shelva Majestic, MD  [facility]

## 2022-05-26 NOTE — ED to IP RN Note (Addendum)
[facility]  ED NURSING NOTE FOR THE RECEIVING INPATIENT NURSE   ED NURSE [place] [phone]   ED CHARGE RN Olegario Messier   ADMISSION INFORMATION   Shelby Barron is a 82 y.o. female admitted with an ED diagnosis of:    1. Peritonitis    2. SOB (shortness of breath)         Isolation: None   Allergies: Sulfa antibiotics   Holding Orders confirmed? Yes   Belongings Documented? N/A   Home medications sent to pharmacy confirmed? N/A   NURSING CARE   Patient Comes From:   Mental Status: Home Independent  alert and oriented   ADL: Needs assistance with ADLs   Ambulation: 1 person assist   Pertinent Information  and Safety Concerns:     [place] Violence Risk Level: Low Worsening severe suprapubic ab pain; pt has recent surgery on bladder in April where pt was dx with cancer. Blood cultures x2 taken, lactic acid neg; vanco in process of being given. Pt is on peritoneal dialysis, still produces urine . Last dialysis session yesterday. Pt OX4, RA, NSR. Initial trop negative.      CT / NIH   CT Head ordered on this patient?  N/A   NIH/Dysphagia assessment done prior to admission? N/A   VITAL SIGNS (at the time of this note)      Vitals:    05/26/22 1530   BP: 112/57   Pulse: 79   Resp: 19   Temp:    SpO2: 100%     Pain Score: 10-severe pain (05/26/22 1502)

## 2022-05-26 NOTE — Progress Notes (Signed)
05/26/22 2318   Initiating APD (Putting the Patient ON)    $APD Start Time 2318   CCPD Total Treatment Time Ordered (hours) 10 hours   Number of Cycles/Exchanges Ordered 5   Total Fill Volume (mL) 10000 mL   Fluid Orders for APD/CCPD   Cycler Bag #1 1.5%, Low Calcium in 5000 mL   Cycler Bag #2 1.5%, Low Calcium in 5000 mL

## 2022-05-26 NOTE — ED Triage Notes (Addendum)
Patient to ED with c/o shortness of breath, lower abdominal pain, nausea, and cough x 4 days. Had a biopsy for bladder cancer on April 1 st. Patient is on dialysis. Recently treated for UTI.

## 2022-05-26 NOTE — Progress Notes (Signed)
Initial Pharmacy Vancomycin Dosing Consult:  Day 1    Patient Parameters  Age: 82 y.o.  Wt Readings from Last 1 Encounters:   05/26/22 88.5 kg (195 lb)         Serum creatinine: 13.6 mg/dL (H) 60/45/40 9811  Estimated creatinine clearance: 3.3 mL/min (A)    Assessment     Vancomycin therapy is for empiric treatment of Other- peritonitis   Vancomycin Monitoring Goal:  15-20mg /dL   Renal function assessment - Vancomycin: not applicable as patient requires peritoneal dialysis   Will dose vancomycin with Single dose then redose vancomycin based on level assessment    Plan:   Give a one time vancomycin dose of 1750 mg (given today at 1600)  Vancomycin random level has been ordered for 5/11 with AM labs (48-72 hours after loading dose per protocol)  Pharmacist will assess daily for additional vancomycin doses       Thank you,  [place] M Cor[place], [facility]  Phone: [phone]

## 2022-05-26 NOTE — H&P (Signed)
[facility]      Patient: Shelby Barron  Date: 05/26/2022   DOB: 02-26-1940  Date of Admission: 05/26/2022   MRN: [id number]  Attending: Dorann Ou, DNP NP         Chief Complaint   Patient presents with    Abdominal Pain    Shortness of Breath    Nausea    Cough        History Gathered From: patient, patient's daughter, prior records    HISTORY AND PHYSICAL     Shelby Barron is a 82 y.o. 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 She underwent staging CT scan on 04/29/2022, which showed moderate right hydroureteronephrosis, which is stable down to the level of the UVJ present for over a year. She had a low density right lower pole renal lesion and small pulmonary nodules measuring up to 5 mm; felt to be likely benign. Given these findings, patient moved here with her daughter and since gotten established with nephrology, Dr. Mirian Capuchin, and [facility] urology. The ultimate plan is for cystectomy and nephrectomy with formation of an [place] pouch, but patient needs cardiac clearance before surgery can be scheduled. Recently, patient began having severe pain with urination. She was placed on a 5 day course of Levaquin which she completed on 5/5. Symptoms, however, have persisted. She has also become extremely weak over the last week and is having constant suprapubic and lower abdominal tenderness. She had a telehealth visit earlier today with nephrology and was referred to the ED for evaluation where she underwent CT showing new finding of grade 3 pronounced right -sided hydronephrosis and diffuse ureterectasis to the level of the right UVJ and several small masses of the right kidney. Labs  show WBC 17.54, hgb 10.2 (at baseline), cr 13.6, BUN 53, calcium 10.5, anion gap 18. Patient was hypotensive in the ED with BP 92/60. Case was discussed with patient's nephrologist, who was concerned about possible peritonitis. She was vancomycin & cefepime in the ED and subsequently admitted to hospitalist service.     Past Medical History:   Diagnosis Date    Acid reflux     Asthma, well controlled     Cancer of kidney     Chronic lower back pain     COPD (chronic obstructive pulmonary disease)     CVA (cerebral infarction) Jan 12, 2003    Diabetes     Diabetic nephropathy     Fibroids     Gout     H/O: gout     Hyperlipidemia     Hypertension     Neuropathy of hand     SOB (shortness of breath)        Past Surgical History:   Procedure Laterality Date    APPENDECTOMY (OPEN)      HERNIA REPAIR      HYSTERECTOMY      tumor removed from kidney  cJanuary 2015     Current Outpatient Medications   Medication Instructions    allopurinol (ZYLOPRIM) 100 mg, Oral, Daily    atorvastatin (LIPITOR) 40 mg, Oral, Daily    B complex-vitamin C-folic acid (NEPHRO-VITE RX) 1 MG tablet 1 tablet, Oral, Every  morning with breakfast    cinacalcet (SENSIPAR) 30 mg, Oral, Daily    clopidogrel (PLAVIX) 75 mg, Oral, Daily,      conjugated estrogens (PREMARIN) vaginal cream Vaginal, Daily    insulin aspart (NOVOLOG) 5-10 Units, Subcutaneous, Every evening    Insulin Degludec (TRESIBA) 40 Units, Subcutaneous, Daily    metoprolol tartrate (LOPRESSOR) 12.5 mg, Oral, Daily    montelukast (SINGULAIR) 10 mg, Oral, At bedtime    oxybutynin XL (DITROPAN-XL) 5 mg, Oral, Daily    pantoprazole (PROTONIX) 40 mg, Oral, 2 times daily    Polysaccharide Iron Complex 150 MG Tab 1 tablet, Oral, Daily    potassium chloride (K-TAB) 10 MEQ CR tablet 10 mEq, Oral, Daily    pregabalin (LYRICA) 50 mg, Daily    sevelamer carbonate (RENVELA) 1,600 mg, Oral, 2 times daily before meals    spironolactone (ALDACTONE) 25 mg, Oral, Daily    torsemide (DEMADEX) 100 mg,  Oral, Daily    umeclidinium-vilanterol (Anoro Ellipta) 62.5-25 MCG/ACT Aerosol Pwdr, Breath Activated 1 puff, Inhalation, Daily    Zolpidem Tartrate (AMBIEN PO) 5 mg, Oral, As needed,           Allergies   Allergen Reactions    Sulfa Antibiotics Hives       Family History   Problem Relation Age of Onset    Cancer Mother     Heart disease Father     Cancer Maternal Aunt     Diabetes Maternal Aunt     Cancer Paternal Aunt        Social History     Tobacco Use    Smoking status: Former    Smokeless tobacco: Never   Vaping Use    Vaping status: Never Used   Substance Use Topics    Alcohol use: No    Drug use: No     REVIEW OF SYSTEMS   Gen: + fatigue. No fever, chills, malaise  HEENT: No vision changes, runny nose, sore throat, difficulty swallowing, or ear pain  Neck: No neck stiffness  Resp: No cough or wheezing. Not on home oxygen.  Cardio: + DOE, poor exercise tolerance. No chest pain, palpitations. No edema, orthopnea, or PND  Abd: + lower abdominal pain. No nausea, vomiting, diarrhea, constipation, changes in bowel habits, melena, hematochezia, or hematemesis  GU: + severe dysuria  MSK: No unusual back pain or joint pain  Derm: No rashes or itching  Neuro: + generalized weakness. No headache, confusion, seizures, syncope, or speech changes  Heme/Lymph: No easy bruising or bleeding tendencies. No swollen lymph nodes  Endo: No heat or cold intolerance. Polydypsia or polyuria  Psych: No acute depression or anxiety.    PHYSICAL EXAM   Vital Signs (most recent): BP 123/63   Pulse 84   Temp 97.3 F (36.3 C) (Oral)   Resp 19   Ht 1.6 m (5\' 3" )   Wt 88.5 kg (195 lb)   SpO2 98%   BMI 34.54 kg/m   Constitutional: No apparent distress. Patient speaks freely in full sentences.   HEENT: normocephalic, atraumatic, PERRL, no scleral icterus, no conjunctival pallor, no nasal discharge  Neck: trachea midline, supple, no JVD  Cardiovascular: RRR, normal S1 S2  Respiratory: Normal rate. No increased work of breathing. Clear  to auscultation, Not on oxygen.  Gastrointestinal: +BS, non-distended, soft, + left side and suprapubic tenderness  Genitourinary: + suprapubic tenderness  Musculoskeletal: ROM and motor strength normal  Ext: No edema, pulses 2+/equal bilaterally  Neurologic: alert, oriented x  4, no focal deficits  Skin: normal color, warm, dry  Psychiatric: calm, cooperative, appropriate mood/affect    LABS & IMAGING     Recent Results (from the past 24 hour(s))   Glucose Whole Blood - POCT    Collection Time: 05/26/22  3:13 PM   Result Value Ref Range    Whole Blood Glucose POCT 79 70 - 100 mg/dL   ECG 12 lead    Collection Time: 05/26/22  3:20 PM   Result Value Ref Range    Ventricular Rate 84 BPM    Atrial Rate 84 BPM    P-R Interval 158 ms    QRS Duration 76 ms    Q-T Interval 370 ms    QTC Calculation (Bezet) 437 ms    P Axis -13 degrees    R Axis -12 degrees    T Axis 24 degrees    IHS MUSE NARRATIVE AND IMPRESSION       NORMAL SINUS RHYTHM  INFERIOR MYOCARDIAL INFARCTION , AGE UNDETERMINED  WHEN COMPARED WITH ECG OF 25-Jan-2013 13:59,  INFERIOR MYOCARDIAL INFARCTION IS NOW PRESENT  Confirmed by PARK, MD, DAVID ([id number]) on 05/26/2022 8:33:34 PM     CBC and differential    Collection Time: 05/26/22  3:26 PM   Result Value Ref Range    WBC 17.54 (H) 3.10 - 9.50 x10 3/uL    Hgb 10.2 (L) 11.4 - 14.8 g/dL    Hematocrit 16.1 (L) 34.7 - 43.7 %    Platelets 284 142 - 346 x10 3/uL    RBC 3.36 (L) 3.90 - 5.10 x10 6/uL    MCV 93.8 78.0 - 96.0 fL    MCH 30.4 25.1 - 33.5 pg    MCHC 32.4 31.5 - 35.8 g/dL    RDW 15 11 - 15 %    MPV 10.3 8.9 - 12.5 fL    Instrument Absolute Neutrophil Count 13.03 (H) 1.10 - 6.33 x10 3/uL    Neutrophils 74.3 None %    Lymphocytes Automated 15.6 None %    Monocytes 9.1 None %    Eosinophils Automated 0.1 None %    Basophils Automated 0.3 None %    Immature Granulocytes 0.6 None %    Nucleated RBC 0.1 (H) 0.0 - 0.0 /100 WBC    Neutrophils Absolute 13.03 (H) 1.10 - 6.33 x10 3/uL    Lymphocytes Absolute Automated  2.74 0.42 - 3.22 x10 3/uL    Monocytes Absolute Automated 1.60 (H) 0.21 - 0.85 x10 3/uL    Eosinophils Absolute Automated 0.01 0.00 - 0.44 x10 3/uL    Basophils Absolute Automated 0.06 0.00 - 0.08 x10 3/uL    Immature Granulocytes Absolute 0.10 (H) 0.00 - 0.07 x10 3/uL    Absolute NRBC 0.02 (H) 0.00 - 0.00 x10 3/uL   Comprehensive metabolic panel    Collection Time: 05/26/22  3:26 PM   Result Value Ref Range    Glucose 98 70 - 100 mg/dL    BUN 09.6 (H) 7.0 - 04.5 mg/dL    Creatinine 40.9 (H) 0.4 - 1.0 mg/dL    Sodium 811 914 - 782 mEq/L    Potassium 5.0 3.5 - 5.3 mEq/L    Chloride 95 (L) 99 - 111 mEq/L    CO2 25 17 - 29 mEq/L    Calcium 10.5 (H) 7.9 - 10.2 mg/dL    Protein, Total 7.2 6.0 - 8.3 g/dL    Albumin 3.2 (L) 3.5 - 5.0 g/dL    AST (SGOT)  18 5 - 41 U/L    ALT 13 0 - 55 U/L    Alkaline Phosphatase 78 37 - 117 U/L    Bilirubin, Total 0.6 0.2 - 1.2 mg/dL    Globulin 4.0 (H) 2.0 - 3.6 g/dL    Albumin/Globulin Ratio 0.8 (L) 0.9 - 2.2    Anion Gap 18.0 (H) 5.0 - 15.0    eGFR 2.4 (A) >=60 mL/min/1.73 m2   Lipase    Collection Time: 2022-06-12  3:26 PM   Result Value Ref Range    Lipase 23 8 - 78 U/L   High Sensitivity Troponin-I at 0 hrs    Collection Time: 06-12-22  3:26 PM   Result Value Ref Range    hs Troponin-I 11.8 SEE BELOW ng/L   NT-proBNP    Collection Time: Jun 12, 2022  3:26 PM   Result Value Ref Range    NT-proBNP 696 (H) 0 - 450 pg/mL   Lactic Acid    Collection Time: 06/12/22  3:26 PM   Result Value Ref Range    Lactic Acid 2.0 0.2 - 2.0 mmol/L   COVID-19 (SARS-CoV-2) and Influenza A/B, NAA (Liat Rapid)    Collection Time: 06/12/2022  3:35 PM    Specimen: Nasopharyngeal; Culturette   Result Value Ref Range    Purpose of COVID testing Diagnostic -PUI     SARS-CoV-2 Specimen Source Nasal Swab     SARS CoV 2 Overall Result Not Detected     Influenza A Not Detected     Influenza B Not Detected    High Sensitivity Troponin-I at 2 hrs with calculated Delta    Collection Time: 06-12-2022  6:37 PM   Result Value Ref  Range    hs Troponin-I 9.6 SEE BELOW ng/L    hs Troponin-I Delta -2.2 ng/L       MICROBIOLOGY:  Blood Culture: pending  Urine Culture: pending  Peritoneal fluid cultures: pending    Radiology:  CT Chest Abdomen Pelvis WO Contrast    Result Date: 06/12/2022  1. Grade 3 pronounced right-sided hydronephrosis and diffuse ureterectasis to the level of the right ureterovesical junction of uncertain etiology. This is new from prior magnetic resonance imaging 2. Several small masses of the right kidney may represent small solid masses or complex cysts. These could be further evaluated by renal magnetic resonance imaging when patient is clinically stable [name], MD 06/12/2022 4:47 PM    Chest AP Only    Result Date: 2022-06-12  No acute cardiopulmonary disease. Fonnie Mu, DO 06/12/2022 4:03 PM       CARDIAC:  EKG Interpretation (upon my review):  NSR @ 84, TWI in V1, no changes from prior    Markers:  Recent Labs   Lab 06-12-22  1837 06-12-22  1526   hs Troponin-I 9.6 11.8   hs Troponin-I Delta -2.2  --        EMERGENCY DEPARTMENT COURSE:  Orders Placed This Encounter   Procedures    Culture Blood Aerobic and Anaerobic    Culture Blood Aerobic and Anaerobic    COVID-19 (SARS-CoV-2) and Influenza A/B, NAA (Liat Rapid)    Chest AP Only    CT Chest Abdomen Pelvis WO Contrast    CBC and differential    Comprehensive metabolic panel    Lipase    High Sensitivity Troponin-I at 0 hrs    High Sensitivity Troponin-I at 2 hrs with calculated Delta    NT-proBNP    Lactic Acid    Urinalysis  Reflex to Microscopic Exam- Reflex to Culture    Adult diet Therapeutic/ Modified; Solid; Regular (IDDSI level 7); Thin (IDDSI level 0); Consistent carbohydrate, Renal    Glucose POC    Vital signs    Pulse Oximetry    Progressive Mobility Protocol    Notify physician    I/O    Height    Weight    Skin assessment    Notify physician (Critical Blood Glucose Value)    Notify physician (Communication: Document Abnormal Blood Glucose)    POCT order  (PRN hypoglycemia)    Adult Hypoglycemia Treatment Algorithm    Place sequential compression device    Maintain sequential compression device    Education: Activity    Education: Disease Process & Condition    Education: Pain Management    Education: Falls Risk    Education: Smoking Cessation    No antipyretics    Straight cath    Full Code    Inpatient consult to Nephrology    Glucose Whole Blood - POCT    ECG 12 lead    Saline lock IV    Adult Admit to Inpatient       ASSESSMENT & PLAN     ANTONETTE HENDRICKS is a 82 y.o. female admitted with sepsis.    Nurses notes, labs, imaging reviewed by me.  Records reviewed in Care Everywhere & from prior encounters.  Case discussed with ED provider.   Diagnostic studies placed by me: UA/culture, CBC, BMP, mag, phos      Patient Active Hospital Problem List:    Sepsis   - as evidenced by leukocytosis, hypotension   - likely d/t UTI, but peritonitis also in DD   - lactic normal   - avoiding aggressive hydration given ESRD   - cultures pending   - continue vanc/cefepime as started in ED    UTI  Bladder cancer  Obstructive uropathy with grade 3 hydronephrosis   - UA/culture obtained on arrival   - culture pending   - Vanc, cefepime as above   - Urology consult placed - please call in a.m.    COPD   - stable   - resume albuterol PRN   - resume anoro ellipta & singulair per home dose    HTN  HLD  H/o CVA   - resume metoprolol per home dose   - resume atorvastatin per home dose   - holding plavix until urology eval in case invasive procedure warranted   - cardiology consult placed for expedited cards clearance/need for surgery - please call in a.m.    ESRD on PD  HAGMA   - HAGMA d/t ESRD   - nephrology consulted and following   - resume nephrovite, sensipar, renvela   - hold torsemide & aldactone given hypotension   - monitor I&O   - follow chemistries    DMII   - glucose 98 & 100   - hold home tresiba for now   - low dose SSI   - carb consistent diet   - check A1C    GERD   - resume  PPI    Gout   - resume allopurinol    Iron deficient anemia  Anemia of chronic disease   - resume iron supplement   - follow h/h        Patient has BMI=Body mass index is 34.54 kg/m.  Diagnosis: Obesity based on BMI criteria         Recent Labs  Lab 05/26/22  1526   Hgb 10.2*   Hematocrit 31.5*   MCV 93.8   WBC 17.54*   Platelets 284         Anemia Diagnosis: Already documented in note    Nutrition: carb consistent, renal diet    DVT/VTE Prophylaxis:   Current Facility-Administered Medications (Includes Only Anticoagulants, Misc. Hematological)   Medication Dose Route Last Admin    heparin (porcine) injection 5,000 Units  5,000 Units Subcutaneous         Code Status: Full Code    Patient Class: INPATIENT. Inpatient status is judged to be reasonable and necessary in order to provide the required intensity of service to ensure the patient's safety. The patient's presenting symptoms, physical exam findings, and initial radiographic and laboratory data in the context of their chronic comorbidities is felt to place them at high risk for further clinical deterioration. Furthermore, it is not anticipated that the patient will be medically stable for discharge from the hospital within 2 midnights of admission. The following factors support the admission status of inpatient: the patient's presenting symptoms of fatige, abdominal pain dysuria, worrisome physical exam findings of hypotension, abdominal tenderness, worrisome laboratory data of leukocytosis, UA + UTI, imaging consistent with grade 3 hydronephrosis, and significant comorbidities including bladder cancer, ESRD, COPD, HTN, HLD, h/o CVA, DMII.    I certify that at the point of admission it is my clinical judgment that the patient will require inpatient hospital care spanning beyond 2 midnights from the point of admission due to high intensity of service, high risk for further deterioration and high frequency of surveillance required.     This patient is seriously  ill with potentially life-threatening condition(s) and the possibility of sudden clinically significant deterioration due to the condition(s) noted in the assessment and plan, which requires a high level of medical decision making by the advance practice provider.  Full attention to the direct care of this patient was provided for the period of time noted below.     Anticipated medical stability for discharge: Greater than 48 Hours      Signed,  Dorann Ou, [place] NP    05/26/2022 8:42 PM  Time Elapsed: 75

## 2022-05-26 NOTE — Progress Notes (Signed)
[facility] started to dwell over night, pt has no discomfort reported,  PD access has no dressing open to air as what the pt wants, unable to collect PD sample pt is empty will collect in AM, report was given to primary nurse.

## 2022-05-27 ENCOUNTER — Encounter: Payer: Self-pay | Admitting: Internal Medicine

## 2022-05-27 LAB — URINALYSIS WITH REFLEX TO MICROSCOPIC EXAM - REFLEX TO CULTURE
Bilirubin, UA: NEGATIVE
Glucose, UA: NEGATIVE
Ketones UA: NEGATIVE
Nitrite, UA: NEGATIVE
Specific Gravity UA: 1.02 (ref 1.001–1.035)
Urine pH: 6.5 (ref 5.0–8.0)
Urobilinogen, UA: NORMAL mg/dL (ref 0.2–2.0)

## 2022-05-27 LAB — CBC AND DIFFERENTIAL
Absolute NRBC: 0 10*3/uL (ref 0.00–0.00)
Basophils Absolute Automated: 0.08 10*3/uL (ref 0.00–0.08)
Basophils Automated: 0.6 %
Eosinophils Absolute Automated: 0.01 10*3/uL (ref 0.00–0.44)
Eosinophils Automated: 0.1 %
Hematocrit: 28.7 % — ABNORMAL LOW (ref 34.7–43.7)
Hgb: 9.5 g/dL — ABNORMAL LOW (ref 11.4–14.8)
Immature Granulocytes Absolute: 0.1 10*3/uL — ABNORMAL HIGH (ref 0.00–0.07)
Immature Granulocytes: 0.7 %
Instrument Absolute Neutrophil Count: 9.11 10*3/uL — ABNORMAL HIGH (ref 1.10–6.33)
Lymphocytes Absolute Automated: 2.77 10*3/uL (ref 0.42–3.22)
Lymphocytes Automated: 20.7 %
MCH: 31.1 pg (ref 25.1–33.5)
MCHC: 33.1 g/dL (ref 31.5–35.8)
MCV: 94.1 fL (ref 78.0–96.0)
MPV: 10.2 fL (ref 8.9–12.5)
Monocytes Absolute Automated: 1.29 10*3/uL — ABNORMAL HIGH (ref 0.21–0.85)
Monocytes: 9.7 %
Neutrophils Absolute: 9.11 10*3/uL — ABNORMAL HIGH (ref 1.10–6.33)
Neutrophils: 68.2 %
Nucleated RBC: 0 /100 WBC (ref 0.0–0.0)
Platelets: 246 10*3/uL (ref 142–346)
RBC: 3.05 10*6/uL — ABNORMAL LOW (ref 3.90–5.10)
RDW: 15 % (ref 11–15)
WBC: 13.36 10*3/uL — ABNORMAL HIGH (ref 3.10–9.50)

## 2022-05-27 LAB — BASIC METABOLIC PANEL
Anion Gap: 15 (ref 5.0–15.0)
BUN: 57 mg/dL — ABNORMAL HIGH (ref 7.0–21.0)
CO2: 23 mEq/L (ref 17–29)
Calcium: 9.5 mg/dL (ref 7.9–10.2)
Chloride: 100 mEq/L (ref 99–111)
Creatinine: 14.4 mg/dL — ABNORMAL HIGH (ref 0.4–1.0)
Glucose: 167 mg/dL — ABNORMAL HIGH (ref 70–100)
Potassium: 4.3 mEq/L (ref 3.5–5.3)
Sodium: 138 mEq/L (ref 135–145)
eGFR: 2.3 mL/min/{1.73_m2} — AB (ref >=60)

## 2022-05-27 LAB — WHOLE BLOOD GLUCOSE POCT
Whole Blood Glucose POCT: 139 mg/dL — ABNORMAL HIGH (ref 70–100)
Whole Blood Glucose POCT: 187 mg/dL — ABNORMAL HIGH (ref 70–100)
Whole Blood Glucose POCT: 120 mg/dL — ABNORMAL HIGH (ref 70–100)
Whole Blood Glucose POCT: 204 mg/dL — ABNORMAL HIGH (ref 70–100)

## 2022-05-27 LAB — PHOSPHORUS: Phosphorus: 6.7 mg/dL — ABNORMAL HIGH (ref 2.3–4.7)

## 2022-05-27 LAB — HEMOGLOBIN A1C
Average Estimated Glucose: 197.3 mg/dL
Hemoglobin A1C: 8.5 % — ABNORMAL HIGH (ref 4.6–5.6)

## 2022-05-27 LAB — MAGNESIUM: Magnesium: 2.5 mg/dL (ref 1.6–2.6)

## 2022-05-27 MED ORDER — DIANEAL LOW CALCIUM/1.5% DEX 344 MOSM/L IP SOLN
5000.0000 mL | INTRAPERITONEAL | Status: DC
Start: 2022-05-27 — End: 2022-05-31
  Filled 2022-05-27 (×2): qty 5000

## 2022-05-27 MED ORDER — DIPHENHYDRAMINE HCL 25 MG PO CAPS
25.0000 mg | ORAL_CAPSULE | Freq: Every evening | ORAL | Status: DC | PRN
Start: 2022-05-27 — End: 2022-06-04
  Administered 2022-05-27: 25 mg via ORAL
  Filled 2022-05-27: qty 1

## 2022-05-27 MED ORDER — DIANEAL LOW CALCIUM/1.5% DEX 344 MOSM/L IP SOLN
2000.0000 mL | INTRAPERITONEAL | Status: DC
Start: 2022-05-27 — End: 2022-05-31
  Filled 2022-05-27 (×2): qty 2000

## 2022-05-27 MED ORDER — INSULIN GLARGINE 100 UNIT/ML SC SOLN
15.0000 [IU] | Freq: Every morning | SUBCUTANEOUS | Status: DC
Start: 2022-05-27 — End: 2022-05-27

## 2022-05-27 MED ORDER — INSULIN GLARGINE 100 UNIT/ML SC SOLN
10.0000 [IU] | Freq: Every morning | SUBCUTANEOUS | Status: DC
Start: 2022-05-27 — End: 2022-06-04
  Administered 2022-05-27 – 2022-06-04 (×8): 10 [IU] via SUBCUTANEOUS
  Filled 2022-05-27 (×8): qty 10

## 2022-05-27 NOTE — UM Notes (Signed)
Review 05/26/22 and 05/27/22      PATIENT NAME: Shelby Barron, Shelby Barron   DOB: Jul 30, 1940     Adult Admit to Inpatient (Order #[id number]) on 05/26/22     Admit Diagnosis: SOB (shortness of breath) [R06.02]  Peritonitis [K65.9]  Facility: [facility]    Reason for Admission   -82 year old female admitted to inpatient at [facility] on 5/8. Patient past medical history GERD, COPD, DMII, RCC s/p partial nephrectomy, gout, ESRD on PD, CVA, HTN, HLD, anemia, underwent cystoscopy on 04/19/22 in [place] and was found to have a large bladder tumor involving the trigone and bladder neck which was resected. She underwent staging CT scan on 04/29/2022, which showed moderate right hydroureteronephrosis, which is stable down to the level of the UVJ present for over a year. She had a low density right lower pole renal lesion and small pulmonary nodules measuring up to 5 mm; felt to be likely benign. The ultimate plan is for cystectomy and nephrectomy with formation of an [place] pouch, but patient needs cardiac clearance before surgery can be scheduled. Recently, patient began having severe pain with urination. She was placed on a 5 day course of Levaquin which she completed on 5/5. Patient presents with pain with urination, weakness and constant suprapubic and lower abdominal tenderness. CT in ED showed  new finding of grade 3 pronounced right -sided hydronephrosis and diffuse ureterectasis to the level of the right UVJ and several small masses of the right kidney. Patient on IV antibiotics.     Vital Signs  Temp 97.8  HR 76  RR 24  BP 92/60  SPO2 98% on RA    Medications  Scheduled Meds:  Current Facility-Administered Medications   Medication Dose Route Frequency    allopurinol  100 mg Oral Daily    atorvastatin  40 mg Oral Daily    b complex-vitamin c-folic acid  1 tablet Oral Daily    cefepime  1 g Intravenous Q24H    cinacalcet  30 mg Oral Daily    [Held by provider] clopidogrel  75 mg Oral Daily    heparin (porcine)  5,000 Units  Subcutaneous Q8H SCH    insulin glargine  10 Units Subcutaneous QAM    insulin lispro  1-3 Units Subcutaneous QHS    insulin lispro  1-5 Units Subcutaneous TID AC    iron polysaccharides  150 mg Oral Daily    metoprolol tartrate  12.5 mg Oral Daily    montelukast  10 mg Oral QHS    oxybutynin XL  5 mg Oral Daily    pantoprazole  40 mg Oral Q12H    senna-docusate  2 tablet Oral QHS    sevelamer carbonate  1,600 mg Oral BID AC    umeclidinium-vilanterol  1 puff Inhalation QAM     Abnormal Labs   Latest Reference Range & Units 05/26/22 15:26 05/26/22 21:49 05/26/22 22:34   WBC 3.10 - 9.50 x10 3/uL 17.54 (H)     Hemoglobin 11.4 - 14.8 g/dL 91.4 (L)     Hematocrit 34.7 - 43.7 % 31.5 (L)     RBC 3.90 - 5.10 x10 6/uL 3.36 (L)     Instrument Absolute Neutrophil Count 1.10 - 6.33 x10 3/uL 13.03 (H)     Monocytes Absolute Automated 0.21 - 0.85 x10 3/uL 1.60 (H)     Immature Granulocytes Absolute 0.00 - 0.07 x10 3/uL 0.10 (H)     Neutrophils Absolute 1.10 - 6.33 x10 3/uL 13.03 (H)  Whole Blood Glucose POCT 70 - 100 mg/dL  161    BUN 7.0 - 09.6 mg/dL 04.5 (H)     Creatinine 0.4 - 1.0 mg/dL 40.9 (H)     eGFR >=81 mL/min/1.73 m2 2.4 !     Albumin 3.5 - 5.0 g/dL 3.2 (L)     Globulin 2.0 - 3.6 g/dL 4.0 (H)     Albumin/Globulin Ratio 0.9 - 2.2  0.8 (L)     NT-proBNP 0 - 450 pg/mL 696 (H)     Color, UA    Orange !   Clarity, UA Clear - Hazy    Turbid !   Leukocyte Esterase, UA Negative    Large !   Protein, UR Negative    600= 3+ !   Blood, UA Negative    Moderate !   RBC UA 0 - 5 /hpf   TNTC !   WBC, UA 0 - 5 /hpf   TNTC !   Squamous Epithelial Cells, Urine 0 - 25 /hpf   26-50 !   WBC Clumps, UA None /hpf   Many !   Hyaline Casts, UA 0 - 5 /lpf   26-50 !       Imaging   CT Chest Abdomen Pelvis WO Contrast    Result Date: 2022/06/01  1. Grade 3 pronounced right-sided hydronephrosis and diffuse ureterectasis to the level of the right ureterovesical junction of uncertain etiology. This is new from prior magnetic resonance imaging 2.  Several small masses of the right kidney may represent small solid masses or complex cysts. These could be further evaluated by renal magnetic resonance imaging when patient is clinically stable [name], MD 2022/06/01 4:47 PM    Chest AP Only    Result Date: Jun 01, 2022  No acute cardiopulmonary disease. Fonnie Mu, DO 2022-06-01 4:03 PM      Plan of Care  Per Medicine  Sepsis   - as evidenced by leukocytosis, hypotension   - likely d/t UTI, but peritonitis also in DD   - lactic normal   - avoiding aggressive hydration given ESRD   - cultures pending   - continue vanc/cefepime as started in ED     UTI  Bladder cancer  Obstructive uropathy with grade 3 hydronephrosis   - UA/culture obtained on arrival   - culture pending   - Vanc, cefepime as above   - Urology consult placed - please call in a.m.     COPD   - stable   - resume albuterol PRN   - resume anoro ellipta & singulair per home dose     HTN  HLD  H/o CVA   - resume metoprolol per home dose   - resume atorvastatin per home dose   - holding plavix until urology eval in case invasive procedure warranted   - cardiology consult placed for expedited cards clearance/need for surgery - please call in a.m.     ESRD on PD  HAGMA   - HAGMA d/t ESRD   - nephrology consulted and following   - resume nephrovite, sensipar, renvela   - hold torsemide & aldactone given hypotension   - monitor I&O   - follow chemistries     DMII   - glucose 98 & 100   - hold home tresiba for now   - low dose SSI   - carb consistent diet   - check A1C     GERD   - resume PPI     Gout   -  resume allopurinol     Iron deficient anemia  Anemia of chronic disease   - resume iron supplement   - follow h/h       Per Nephrology  S/p vanc and cefepime this afternoon  Redose cefepime tomorrow  Vanc per level, goal 15-20  Will put her on PD tonight   Plan to send PD fluid cell count and culture - if dry, we will send off the sample in the morning after completing PD  Please, consult urology regarding CT  findings      Continued Stay Review 05/27/22  Vital Signs  Temp 98.6  HR 88  RR 18  BP 100/51  SPO2 95% on RA      Abnormal Labs   Latest Reference Range & Units 05/27/22 04:31 05/27/22 07:24   WBC 3.10 - 9.50 x10 3/uL 13.36 (H)    Hemoglobin 11.4 - 14.8 g/dL 9.5 (L)    Hematocrit 95.1 - 43.7 % 28.7 (L)    RBC 3.90 - 5.10 x10 6/uL 3.05 (L)    Instrument Absolute Neutrophil Count 1.10 - 6.33 x10 3/uL 9.11 (H)    Monocytes Absolute Automated 0.21 - 0.85 x10 3/uL 1.29 (H)    Immature Granulocytes Absolute 0.00 - 0.07 x10 3/uL 0.10 (H)    Neutrophils Absolute 1.10 - 6.33 x10 3/uL 9.11 (H)    Glucose 70 - 100 mg/dL 884 (H)    Whole Blood Glucose POCT 70 - 100 mg/dL  166 (H)   BUN 7.0 - 06.3 mg/dL 01.6 (H)    Creatinine 0.4 - 1.0 mg/dL 01.0 (H)    eGFR >=93 AT/FTD/3.22 m2 2.3 !    Phosphorus 2.3 - 4.7 mg/dL 6.7 (H)      Plan of Care  Per Medicine  Sepsis 2/2 UTI   - lactic normal, leukocytosis improving    - continue cefepime pending urine cultures     High grade urothelioma bladder CA   Obstructive uropathy with grade 3 hydronephrosis  RCC s/p partial nephrectomy    - Followed by Dr Celine Mans outpatient with plans for cystectomy and nephrectomy   - Urology consulted, await recommendations   - hold plavix pending urology recs   - CTAP with grade 3 R sided hydronephrosis and ureterectasis to the R UVJ. Several small masses of the R kidney which may be solid masses vs complex cysts.     COPD   - stable   - resume albuterol PRN   - resume anoro ellipta & singulair per home dose     HTN  HLD  H/o CVA   - resume metoprolol per home dose   - resume atorvastatin per home dose   - holding plavix until urology eval in case invasive procedure warranted   - cardiology consult placed for expedited cards clearance/need for surgery - please call in a.m.     ESRD on PD  HAGMA 2/2 ESRD   - nephrology consulted, continue PD as ordered   - resume nephrovite, sensipar, renvela   - hold torsemide & aldactone given hypotension   - trend BMP      DMII   - continue reduced dose lantus 10 U daily   - low dose SSI   - carb consistent diet   - A1C 8.5     GERD   - resume PPI     Gout   - resume allopurinol     Iron deficient anemia  Anemia of chronic disease   - resume iron  supplement   - follow h/h     - hold ESA given ca     Per Nephrology  PD as ordered  Cont antibiotics pending cultures  Start phos binders  No ESA given cancer  Await urology consult - sees Dr. Celine Mans as outpt    Primary Coverage:  Payor: MEDICARE [employer] / Plan: UHC MED ADV [phone] / Product Type: MANAGED MEDICARE /     UTILIZATION REVIEW CONTACT: Wendall Stade RN, BSN  Utilization Review   [facility]  [phone]   [phone]  Email: [email or web address]  Tax ID:  [id number]         NOTES TO REVIEWER:    This clinical review is based on/compiled from documentation provided by the treatment team within the patient's medical record.

## 2022-05-27 NOTE — Consults (Signed)
[facility]  CONSULT  [id number]      Date Time: 05/27/22 6:27 AM  Patient Name: Shelby Barron  Requesting Physician: Shelby Pippins, MD  Consulting Physician: Shelby German, MD, MD    Primary Care Physician: Shelby Lager, MD    Reason for Consultation: ESRD      Assessment:     Patient Active Problem List   Diagnosis    Diabetes mellitus type II, uncontrolled    Diabetes mellitus type II, uncontrolled    Neuropathy of hand    Left renal mass    Hamstring injury    Peritonitis       ESRD: PD regimen adjusted and she feels better  Sepsis: given lack of abd findings, suspect her sepsis is due to UTI and not peritonitis  Obstructive uropathy: worsening hydronephrosis  Anemia in CKD: HgB slightly lower than goal  Hyperphosphatemia: due to ESRD  Hypotension: BP still soft    Recommendations:     PD as ordered  Cont antibiotics pending cultures  Start phos binders  No ESA given cancer  Await urology consult - sees Dr. Celine Barron as Shelby Easterly, MD, thank you for this consultation.  We will follow the patient with you during this hospitalization.  Please contact me with any questions or issues.    Signed by: Shelby German, MD  [facility]  [id number] (office)      -----------------------------------------------------------------------------------------------------------        History of Presenting Illness:   Shelby Barron is a 82 y.o. female who presented to the hospital with abd pain.    She has a hx of ESRD on PD, Type 2 DM, COPD, RCC s/p partial nephrectomy. She now has new diagnosis of high grade urothelioma bladder CA with extension up her ureter with plans for cystectomy and nephrectomy soon.    Her PD prescription was recently changed in [place] and she was having abd fullness. She presented to PD clinic and was complaining of abd pain and was sent in for possible peritonitis.     In ER, she was hypotensive with elevated WBC. Started on Vanc and cefepime. This morning  feels better    Past Medical History:     Past Medical History:   Diagnosis Date    Acid reflux     Asthma, well controlled     Cancer of kidney     Chronic lower back pain     COPD (chronic obstructive pulmonary disease)     CVA (cerebral infarction) Jan 12, 2003    Diabetes     Diabetic nephropathy     Fibroids     Gout     H/O: gout     Hyperlipidemia     Hypertension     Neuropathy of hand     SOB (shortness of breath)        Available old records reviewed, including:  labs and notes    Past Surgical History:     Past Surgical History:   Procedure Laterality Date    APPENDECTOMY (OPEN)      HERNIA REPAIR      HYSTERECTOMY      PARTIAL NEPHRECTOMY      tumor removed from kidney  cJanuary 2015    TURBT         Family History:     Family History   Problem Relation Age of Onset    Cancer Mother  Heart disease Father     Cancer Maternal Aunt     Diabetes Maternal Aunt     Cancer Paternal Aunt        Social History:     Social History     Socioeconomic History    Marital status: Married     Spouse name: Not on file    Number of children: Not on file    Years of education: Not on file    Highest education level: Not on file   Occupational History    Not on file   Tobacco Use    Smoking status: Former    Smokeless tobacco: Never   Vaping Use    Vaping status: Never Used   Substance and Sexual Activity    Alcohol use: No    Drug use: No    Sexual activity: Not on file   Other Topics Concern    Not on file   Social History Narrative    Not on file     Social Determinants of Health     Financial Resource Strain: Not on file   Food Insecurity: No Food Insecurity (05/26/2022)    Hunger Vital Sign     Worried About Running Out of Food in the Last Year: Never true     Ran Out of Food in the Last Year: Never true   Transportation Needs: No Transportation Needs (05/26/2022)    PRAPARE - Therapist, art (Medical): No     Lack of Transportation (Non-Medical): No   Physical Activity: Not on file   Stress:  Not on file   Social Connections: Unknown (02/01/2022)    Received from [facility]    Social Network     Social Network: Not on file   Intimate Partner Violence: Not At Risk (05/26/2022)    Humiliation, Afraid, Rape, and Kick questionnaire     Fear of Current or Ex-Partner: No     Emotionally Abused: No     Physically Abused: No     Sexually Abused: No   Housing Stability: Unknown (05/26/2022)    Housing Stability Vital Sign     Unable to Pay for Housing in the Last Year: No     Number of Places Lived in the Last Year: Not on file     Unstable Housing in the Last Year: No       Allergies:     Allergies   Allergen Reactions    Sulfa Antibiotics Hives       Medications:     Medications Prior to Admission   Medication Sig    allopurinol (ZYLOPRIM) 100 MG tablet Take 1 tablet (100 mg) by mouth daily    atorvastatin (LIPITOR) 40 MG tablet Take 1 tablet (40 mg) by mouth daily    clopidogrel (PLAVIX) 75 MG tablet Take 1 tablet (75 mg) by mouth daily    insulin aspart (NOVOLOG) 100 UNIT/ML injection Inject 5-10 Units into the skin every evening    Insulin Degludec 200 UNIT/ML Solution Pen-injector Inject 38 Unit into the skin daily    montelukast (SINGULAIR) 10 MG tablet Take 1 tablet (10 mg) by mouth nightly    pantoprazole (PROTONIX) 40 MG tablet Take 1 tablet (40 mg) by mouth 2 (two) times daily    spironolactone (ALDACTONE) 25 MG tablet Take 1 tablet (25 mg) by mouth daily    B complex-vitamin C-folic acid (NEPHRO-VITE RX) 1 MG tablet Take 1 tablet by mouth every  morning with breakfast    cinacalcet (SENSIPAR) 30 MG tablet Take 1 tablet (30 mg) by mouth daily    conjugated estrogens (PREMARIN) vaginal cream Place vaginally daily    metoprolol tartrate (LOPRESSOR) 25 MG tablet Take 0.5 tablets (12.5 mg) by mouth daily    oxybutynin XL (DITROPAN-XL) 5 MG 24 hr tablet Take 1 tablet (5 mg) by mouth daily    Polysaccharide Iron Complex 150 MG Tab Take 1 tablet (150 mg) by mouth daily    potassium chloride (K-TAB) 10 MEQ CR  tablet Take 1 tablet (10 mEq) by mouth daily    pregabalin (LYRICA) 50 MG capsule Take 50 mg by mouth daily. (Patient not taking: Reported on 05/26/2022)    sevelamer carbonate (RENVELA) 800 MG tablet Take 2 tablets (1,600 mg) by mouth 2 (two) times daily before meals    torsemide (DEMADEX) 100 MG tablet Take 1 tablet (100 mg) by mouth daily    umeclidinium-vilanterol (Anoro Ellipta) 62.5-25 MCG/ACT Aerosol Pwdr, Breath Activated Inhale 1 puff into the lungs daily    Zolpidem Tartrate (AMBIEN PO) Take 5 mg by mouth as needed        Scheduled Meds: PRN Meds:    allopurinol, 100 mg, Oral, Daily  atorvastatin, 40 mg, Oral, Daily  b complex-vitamin c-folic acid, 1 tablet, Oral, Daily  cefepime, 1 g, Intravenous, Q24H  cinacalcet, 30 mg, Oral, Daily  [Held by provider] clopidogrel, 75 mg, Oral, Daily  heparin (porcine), 5,000 Units, Subcutaneous, Q8H SCH  insulin lispro, 1-3 Units, Subcutaneous, QHS  insulin lispro, 1-5 Units, Subcutaneous, TID AC  iron polysaccharides, 150 mg, Oral, Daily  metoprolol tartrate, 12.5 mg, Oral, Daily  montelukast, 10 mg, Oral, QHS  oxybutynin XL, 5 mg, Oral, Daily  pantoprazole, 40 mg, Oral, Q12H  senna-docusate, 2 tablet, Oral, QHS  sevelamer carbonate, 1,600 mg, Oral, BID AC  umeclidinium-vilanterol, 1 puff, Inhalation, QAM  vancomycin, , Intravenous, See Admin Instructions          Continuous Infusions:   acetaminophen, 650 mg, Q6H PRN  benzocaine-menthol, 1 lozenge, Q2H PRN  benzonatate, 100 mg, TID PRN  carboxymethylcellulose sodium, 1 drop, TID PRN  dextrose, 15 g of glucose, PRN   Or  dextrose, 12.5 g, PRN   Or  dextrose, 12.5 g, PRN   Or  glucagon (rDNA), 1 mg, PRN  diphenhydrAMINE, 25 mg, QHS PRN  HYDROmorphone, 0.2 mg, Q4H PRN  melatonin, 3 mg, QHS PRN  naloxone, 0.2 mg, PRN  ondansetron, 4 mg, Q6H PRN   Or  ondansetron, 4 mg, Q6H PRN  oxyCODONE, 5 mg, Q6H PRN  polyethylene glycol, 17 g, Daily PRN  saline, 2 spray, Q4H PRN            Review of Systems:   A comprehensive review of  systems was per the HPI and below:     General ROS: no f/c, no weight changes  HEENT ROS: no blurry vision, no oral lesions, no epistaxis  Allergy/Immunology ROS: no new allergic reactions  Hematological and Lymphatic ROS: no known bleeding/clotting disorders  Respiratory ROS: negative for cough, shortness of breath, or wheezing  Cardiovascular ROS: negative for chest pain or dyspnea on exertion  Gastrointestinal ROS: + for abd/flank pain, change in bowel habits  Genito-Urinary ROS: negative for dysuria, hematuria, difficulty voiding or nocturia  Musculoskeletal ROS:  negative for trauma or falls, arthralgias  Neurological ROS: no focal weakness, no dizziness  Endocrine ROS: no change in libido, no change in hair distribution  Dermatological ROS:  no new rashes or lesions      Physical Exam:     Vitals:    05/26/22 1900 05/26/22 2025 05/27/22 0011 05/27/22 0330   BP: 101/50 123/63 122/68 100/51   Pulse: 79 84 81 83   Resp: (!) 24 19 18 18    Temp:  97.3 F (36.3 C) 98.2 F (36.8 C) 98.4 F (36.9 C)   TempSrc:  Oral Oral Oral   SpO2: 97% 98% 95% 98%   Weight:  88.5 kg (195 lb 1.7 oz)     Height:  1.615 m (5' 3.6")         Intake and Output Summary (Last 24 hours) at Date Time  No intake or output data in the 24 hours ending 05/27/22 1610    Recent weights:      11/28/2012 12/04/2012 12/12/2012 05/09/2013 12/17/2014 05/26/2022   Weight Monitoring   Height 160 cm 160 cm 161.3 cm 160 cm 160 cm 161.5 cm    160 cm   Height Method  Stated Stated  Stated Stated   Weight 96.163 kg 96.163 kg 96.163 kg 95.255 kg 94.348 kg 88.5 kg    88.451 kg   Weight Method  Stated Stated  Stated Standing Scale    Stated   BMI (calculated) 37.6 kg/m2 37.6 kg/m2 37 kg/m2 37.3 kg/m2 36.9 kg/m2 33.9 kg/m2    34.6 kg/m2       General: awake, alert, oriented x 3, no acute distress  HEENT: sclera anicteric, oropharynx clear without lesions, mucous membranes moist  Neck: supple, no lymphadenopathy, no thyromegaly, no JVD, no carotid  bruits  Cardiovascular: regular rate and rhythm, no murmurs, rubs or gallops  Lungs: clear to auscultation bilaterally, without wheezing, rhonchi, or rales  Abdomen: soft, non-tender, non-distended, normoactive bowel sounds, no palpable masses, no hepatosplenomegaly, no rebound or guarding; PD cath site C/D/I  Chest wall: tenderness on left lower ribs  Extremities: no clubbing, cyanosis, or edema  Neuro: A+O x 3, no gross motor/sensory deficits  Skin: no rashes or lesions noted        Labs:     Recent Labs   Lab 05/27/22  0431 05/26/22  1526   WBC 13.36* 17.54*   Hgb 9.5* 10.2*   Hematocrit 28.7* 31.5*   Platelets 246 284     Recent Labs   Lab 05/27/22  0431 05/26/22  1526   Sodium 138 138   Potassium 4.3 5.0   Chloride 100 95*   CO2 23 25   BUN 57.0* 53.0*   Creatinine 14.4* 13.6*   Calcium 9.5 10.5*   Albumin  --  3.2*   Phosphorus 6.7*  --    Magnesium 2.5  --    Glucose 167* 98   eGFR 2.3* 2.4*       Recent Labs   Lab 05/26/22  2234   Urine Type Urine, Catheriz   Color, UA Orange*   Clarity, UA Turbid*   Specific Gravity UA 1.020   Urine pH 6.5   Nitrite, UA Negative   Ketones UA Negative   Urobilinogen, UA Normal   Bilirubin, UA Negative   Blood, UA Moderate*   RBC, UA TNTC*   WBC, UA TNTC*           Imaging personally reviewed, including: CXR      cc: Shelby Pippins, MD  Shelby Lager, MD

## 2022-05-27 NOTE — Plan of Care (Signed)
NURSING SHIFT NOTE     Patient: Shelby Barron  Day: 1      SHIFT EVENTS     Shift Narrative/Significant Events (PRN med administration, fall, RRT, etc.):     Patient admitted to [facility] around 8:20pm, arrived via stretcher. Accompanied by daughter.     Alert and oriented x4. On room air. Afebrile. Urine samples sent. Reporting abdominal pain improved with PRN acetaminophen. PD samples unable to be collected will follow up with morning RN. Continuous PD ongoing. Patient ambulating with x1 assistance with walker as needed. Plan of care discussed with patient. Care ongoing.    Safety and fall precautions remain in place. Purposeful rounding completed.          ASSESSMENT     Changes in assessment from patient's baseline this shift:    Neuro: No  CV: No  Pulm: No  Peripheral Vascular: No  HEENT: No  GI: No  BM during shift: No, Last BM: Last BM Date: 05/26/22  GU: No   Integ: No  MS: No    Pain: Improved  Pain Interventions: Medications and Rest  Medications Utilized: PRN acetaminophen,     Mobility: PMP Activity: Step 6 - Walks in Room of Distance Walked (ft) (Step 6,7): 10 Feet           Lines     Patient Lines/Drains/Airways Status       Active Lines, Drains and Airways       Name Placement date Placement time Site Days    Peripheral IV 05/26/22 20 G Standard Right Antecubital 05/26/22  1532  Antecubital  less than 1    Peritoneal Dialysis Catheter Mid lower abdomen 05/26/22  2000  -- less than 1                         VITAL SIGNS     Vitals:    05/27/22 0330   BP: 100/51   Pulse: 83   Resp: 18   Temp: 98.4 F (36.9 C)   SpO2: 98%       Temp  Min: 97.3 F (36.3 C)  Max: 98.4 F (36.9 C)  Pulse  Min: 73  Max: 84  Resp  Min: 17  Max: 24  BP  Min: 92/60  Max: 123/63  SpO2  Min: 95 %  Max: 100 %    No intake or output data in the 24 hours ending 05/27/22 0432       CARE PLAN     Problem: Pain interferes with ability to perform ADL  Goal: Pain at adequate level as identified by patient  05/27/2022 0239 by Harmon Pier, RN  Outcome: Progressing  Flowsheets (Taken 05/27/2022 0239)  Pain at adequate level as identified by patient:   Identify patient comfort function goal   Reassess pain within 30-60 minutes of any procedure/intervention, per Pain Assessment, Intervention, Reassessment (AIR) Cycle   Evaluate if patient comfort function goal is met   Offer non-pharmacological pain management interventions   Include patient/patient care companion in decisions related to pain management as needed   Assess pain on admission, during daily assessment and/or before any "as needed" intervention(s)  05/27/2022 0238 by Harmon Pier, RN  Outcome: Progressing     Problem: Side Effects from Pain Analgesia  Goal: Patient will experience minimal side effects of analgesic therapy  Outcome: Progressing  Flowsheets (Taken 05/27/2022 0239)  Patient will experience minimal side effects of analgesic therapy: Monitor/assess  patient's respiratory status (RR depth, effort, breath sounds)     Problem: Moderate/High Fall Risk Score >5  Goal: Patient will remain free of falls  Outcome: Progressing  Flowsheets (Taken 05/26/2022 2100)  Moderate Risk (6-13):   MOD-Consider activation of bed alarm if appropriate   MOD-Remain with patient during toileting   MOD-Perform dangle, stand, walk (DSW) prior to mobilization   MOD-Use gait belt when appropriate   MOD-Request PT/OT consult order for patients with gait/mobility impairment     Problem: Patient Receiving Advanced Renal Therapies  Goal: Therapy access site remains intact  Outcome: Progressing  Flowsheets (Taken 05/27/2022 0239)  Therapy access site remains intact: Assess therapy access site     Problem: Compromised Activity/Mobility  Goal: Activity/Mobility Interventions  Outcome: Progressing  Flowsheets (Taken 05/27/2022 0239)  Activity/Mobility Interventions: Pad bony prominences, TAP Seated positioning system when OOB, Promote PMP, Reposition q 2 hrs / turn clock, Offload heels     Problem: Fluid and Electrolyte  Imbalance/ Endocrine  Goal: Fluid and electrolyte balance are achieved/maintained  Outcome: Progressing  Flowsheets (Taken 05/27/2022 0239)  Fluid and electrolyte balance are achieved/maintained:   Monitor/assess lab values and report abnormal values   Assess and reassess fluid and electrolyte status   Monitor for muscle weakness  Goal: Adequate hydration  Outcome: Progressing  Flowsheets (Taken 05/27/2022 0239)  Adequate hydration:   Assess mucus membranes, skin color, turgor, perfusion and presence of edema   Assess for peripheral, sacral, periorbital and abdominal edema   Monitor and assess vital signs and perfusion

## 2022-05-27 NOTE — Progress Notes (Signed)
Ccpd ended 3 out 5 cycle done, total UF - , effleunt clear & yellow,  no fibrin noted, pt keeps having "patient slow flow" error through out the morning, speciment collected, pt awake & alert, denies any discomfort as of now, Meaza RN aware.   05/27/22 1344   Fluid Orders for APD/CCPD   Cycler Bag #1 1.5%, Low Calcium in 5000 mL   Cycler Bag #2 1.5%, Low Calcium in 5000 mL   Completing APD/CCPD (Taking the Patient OFF)    Effluent Appearance Clear;Yellow   Specimen Collected Yes   Peritoneal Dialysis Comments CCPD  Ended

## 2022-05-27 NOTE — Progress Notes (Signed)
CCPD Started, pt aox4, stated feeling sick the whole day and was given oxy & zofran, Report given to [facility] RN.   05/27/22 1900   Fluid Orders for APD/CCPD   Cycler Bag #1 1.5%, Low Calcium in 5000 mL   Cycler Bag #2 1.5%, Low Calcium in 5000 mL   Completing APD/CCPD (Taking the Patient OFF)    Effluent Appearance Yellow;Clear   Specimen Collected Yes   Peritoneal Dialysis Comments CCPD Started

## 2022-05-27 NOTE — Nursing Progress Note (Signed)
4 eyes in 4 hours pressure injury assessment note:      Completed with: Phil Dopp  Unit & Time admitted: [facility], 2020             Bony Prominences: Check appropriate box; if wound is present enter wound assessment in LDA     Occiput:                 [x] WNL  []  Wound present  Face:                     [x] WNL  []  Wound present  Ears:                      [x] WNL  []  Wound present  Spine:                    [x] WNL  []  Wound present  Shoulders:             [x] WNL  []  Wound present  Elbows:                  [x] WNL  []  Wound present  Sacrum/coccyx:     [x] WNL  []  Wound present  Ischial Tuberosity:  [x] WNL  []  Wound present  Trochanter/Hip:      [x] WNL  []  Wound present  Knees:                   [x] WNL  []  Wound present  Ankles:                   [x] WNL  []  Wound present  Heels:                    [x] WNL  []  Wound present  Other pressure areas:  []  Wound location       Device related: []  Device name:         LDA completed if wound present: n/a  Consult WOCN if necessary: n/a    Other skin related issues, ie tears, rash, etc, document in Integumentary flowsheet

## 2022-05-27 NOTE — Consults (Signed)
POTOMAC UROLOGY CONSULTATION      Date of Consultation: 05/27/2022   Patient Name: Shelby Barron     Date of Birth:  12-Sep-1940     MRN:               [id number]     PCP:               Patsy Lager, MD       ASSESSMENT      Muscle invasive bladder cancer  UTI    PLAN       CT chest abdomen pelvis without contrast on 05/26/2022.  Images personally reviewed.  Bilateral atrophic kidneys.  Several small masses of the right kidney.  Moderate right hydroureteronephrosis.  Decompressed bladder  Clinically improving on antibiotics.  Would give a 10-day course of antibiotics.  Follow-up cultures    Signing off.  Reconsult as needed    Will set up outpatient follow-up with this patient    Will need to plan for radical cystectomy with possible nephrectomy      This service required:  detailed history  detailed exam  medical decision making of - MODERATE complexity     The patient was not seen in the Emergency Room.    Rodman Pickle, MD  05/27/2022, 9:30 PM      ===================================================================      CHIEF COMPLAINT:   Chief Complaint   Patient presents with    Abdominal Pain    Shortness of Breath    Nausea    Cough       HISTORY AND PHYSICAL     HISTORY OF PRESENT ILLNESS  Shelby Barron is a 82 y.o. female who was admitted on 05/26/2022  2:58 PM for   Problem List Items Addressed This Visit          Other    * (Principal) Peritonitis - Primary     Other Visit Diagnoses       SOB (shortness of breath)                Urology was consulted for for UTI and bladder cancer and possible renal cancer.  Feeling better on antibiotics.  Likely UTI over peritonitis from peritoneal dialysis    PAST MEDICAL HISTORY  Past Medical History:   Diagnosis Date    Acid reflux     Asthma, well controlled     Cancer of kidney     Chronic lower back pain     COPD (chronic obstructive pulmonary disease)     CVA (cerebral infarction) Jan 12, 2003    Diabetes     Diabetic nephropathy     Fibroids     Gout     H/O: gout      Hyperlipidemia     Hypertension     Neuropathy of hand     SOB (shortness of breath)        PAST SURGICAL HISTORY  Past Surgical History:   Procedure Laterality Date    APPENDECTOMY (OPEN)      HERNIA REPAIR      HYSTERECTOMY      PARTIAL NEPHRECTOMY      tumor removed from kidney  cJanuary 2015    TURBT         SOCIAL HISTORY  Social History     Socioeconomic History    Marital status: Married   Tobacco Use    Smoking status: Former    Smokeless tobacco: Never   Psychologist, educational  Use    Vaping status: Never Used   Substance and Sexual Activity    Alcohol use: No    Drug use: No     Social Determinants of Health     Food Insecurity: No Food Insecurity (05/26/2022)    Hunger Vital Sign     Worried About Running Out of Food in the Last Year: Never true     Ran Out of Food in the Last Year: Never true   Transportation Needs: No Transportation Needs (05/26/2022)    PRAPARE - Therapist, art (Medical): No     Lack of Transportation (Non-Medical): No    Received from [facility]    Social Network   Intimate Partner Violence: Not At Risk (05/26/2022)    Humiliation, Afraid, Rape, and Kick questionnaire     Fear of Current or Ex-Partner: No     Emotionally Abused: No     Physically Abused: No     Sexually Abused: No   Housing Stability: Unknown (05/26/2022)    Housing Stability Vital Sign     Unable to Pay for Housing in the Last Year: No     Unstable Housing in the Last Year: No       ALLERGIES  Allergies   Allergen Reactions    Sulfa Antibiotics Hives       MEDICATIONS  No current facility-administered medications on file prior to encounter.     Current Outpatient Medications on File Prior to Encounter   Medication Sig Dispense Refill    allopurinol (ZYLOPRIM) 100 MG tablet Take 1 tablet (100 mg) by mouth daily      atorvastatin (LIPITOR) 40 MG tablet Take 1 tablet (40 mg) by mouth daily      clopidogrel (PLAVIX) 75 MG tablet Take 1 tablet (75 mg) by mouth daily      insulin aspart (NOVOLOG) 100 UNIT/ML  injection Inject 5-10 Units into the skin every evening      Insulin Degludec 200 UNIT/ML Solution Pen-injector Inject 38 Unit into the skin daily      montelukast (SINGULAIR) 10 MG tablet Take 1 tablet (10 mg) by mouth nightly      pantoprazole (PROTONIX) 40 MG tablet Take 1 tablet (40 mg) by mouth 2 (two) times daily      spironolactone (ALDACTONE) 25 MG tablet Take 1 tablet (25 mg) by mouth daily      B complex-vitamin C-folic acid (NEPHRO-VITE RX) 1 MG tablet Take 1 tablet by mouth every morning with breakfast      cinacalcet (SENSIPAR) 30 MG tablet Take 1 tablet (30 mg) by mouth daily      conjugated estrogens (PREMARIN) vaginal cream Place vaginally daily      metoprolol tartrate (LOPRESSOR) 25 MG tablet Take 0.5 tablets (12.5 mg) by mouth daily      oxybutynin XL (DITROPAN-XL) 5 MG 24 hr tablet Take 1 tablet (5 mg) by mouth daily      Polysaccharide Iron Complex 150 MG Tab Take 1 tablet (150 mg) by mouth daily      potassium chloride (K-TAB) 10 MEQ CR tablet Take 1 tablet (10 mEq) by mouth daily      pregabalin (LYRICA) 50 MG capsule Take 50 mg by mouth daily. (Patient not taking: Reported on 05/26/2022)      sevelamer carbonate (RENVELA) 800 MG tablet Take 2 tablets (1,600 mg) by mouth 2 (two) times daily before meals      torsemide (DEMADEX) 100 MG  tablet Take 1 tablet (100 mg) by mouth daily      umeclidinium-vilanterol (Anoro Ellipta) 62.5-25 MCG/ACT Aerosol Pwdr, Breath Activated Inhale 1 puff into the lungs daily      Zolpidem Tartrate (AMBIEN PO) Take 5 mg by mouth as needed         REVIEW OF SYSTEMS     Ten point review of systems negative or as per HPI and below endorsements.    PHYSICAL EXAM     Vital Signs: BP 108/68   Pulse 100   Temp 97.3 F (36.3 C) (Oral)   Resp 18   Ht 1.615 m (5' 3.6")   Wt 88.5 kg (195 lb 1.7 oz)   SpO2 96%   BMI 33.91 kg/m     TMax: Temp (24hrs), Avg:97.9 F (36.6 C), Min:97.3 F (36.3 C), Max:98.6 F (37 C)    I/Os:   Intake/Output Summary (Last 24 hours) at  05/27/2022 2130  Last data filed at 05/27/2022 0900  Gross per 24 hour   Intake 240 ml   Output --   Net 240 ml       Constitutional: Patient speaks freely in full sentences.   Respiratory: Normal rate. No retractions or increased work of breathing.   Abdomen: non-distended, soft, non-tender, no rebound or guarding  Genitourinary:   Skin: no rashes, jaundice or other lesions  Neurologic: Normal  Psychiatric: AAOx3, affect and mood appropriate. The patient is alert, interactive, appropriate.    LABS & IMAGING     Labs personally reviewed.  CBC  Recent Labs     05/27/22  0431 05/26/22  1526   WBC 13.36* 17.54*   RBC 3.05* 3.36*   Hematocrit 28.7* 31.5*   MCV 94.1 93.8   MCH 31.1 30.4   MCHC 33.1 32.4   RDW 15 15   MPV 10.2 10.3       BMP  Recent Labs     05/27/22  0431 05/26/22  1526   CO2 23 25   Anion Gap 15.0 18.0*   BUN 57.0* 53.0*   Creatinine 14.4* 13.6*       INR  Lab Results   Component Value Date/Time    INR 1.1 11/20/2012 02:40 PM         MICROBIOLOGY  Microbiology Results (last 15 days)       Procedure Component Value Units Date/Time    Culture + Gram Stain,Aerobic, Body Fluid [[id number]] Collected: 05/27/22 1321    Order Status: Completed Specimen: Body Fluid from Peritoneal Fluid (Abdominal) Updated: 05/27/22 1812    Narrative:      ORDER#: [id number]                                    ORDERED BY: Georgiana Shore  SOURCE: Peritoneal Fluid (Abdominal) peritoneal fluidCOLLECTED:  05/27/22 13:21  ANTIBIOTICS AT COLL.:                                RECEIVED :  05/27/22 16:33  Stain, Gram                                FINAL       05/27/22 18:12  05/27/22   Few WBCs             No organisms  seen             Stain performed on Cytospin (concentrated) specimen  Culture and Gram Stain, Aerobic, Body FluidPENDING      Urine culture [[id number]] Collected: 05/26/22 2234    Order Status: No result Specimen: Urine Updated: 05/27/22 1413    Culture + Gram Stain,Aerobic, Body Fluid [[id number]]     Order Status: Sent  Specimen: Body Fluid from Peritoneal Fluid (Abdominal)     Culture, Anaerobic Bacteria [[id number]]     Order Status: Sent Specimen: Other from Peritoneal Fluid (Abdominal)     Culture Blood Aerobic and Anaerobic [[id number]] Collected: 05/26/22 1604    Order Status: Completed Specimen: Arm from Blood, Venipuncture Updated: 05/27/22 2021    Narrative:      ORDER#: [id number]                                    ORDERED BY: Caroline More  SOURCE: Blood, Venipuncture Arm                      COLLECTED:  05/26/22 16:04  ANTIBIOTICS AT COLL.:                                RECEIVED :  05/26/22 19:56  Culture Blood Aerobic and Anaerobic        PRELIM      05/27/22 20:21  05/27/22   No Growth after 1 day/s of incubation.      COVID-19 (SARS-CoV-2) and Influenza A/B, NAA (Liat Rapid) [[id number]] Collected: 05/26/22 1535    Order Status: Completed Specimen: Culturette from Nasopharyngeal Updated: 05/26/22 1601     Purpose of COVID testing Diagnostic -[facility]I     SARS-CoV-2 Specimen Source Nasal Swab     SARS CoV 2 Overall Result Not Detected     Comment: __________________________________________________  -A result of "Detected" indicates POSITIVE for the    presence of SARS CoV-2 RNA  -A result of "Not Detected" indicates NEGATIVE for the    presence of SARS CoV-2 RNA  __________________________________________________________  Test performed using the Roche cobas Liat SARS-CoV-2 assay. This assay is  only for use under the Food and Drug Administration's Emergency Use  Authorization. This is a real-time RT-PCR assay for the qualitative  detection of SARS-CoV-2 RNA. Viral nucleic acids may persist in vivo,  independent of viability. Detection of viral nucleic acid does not imply the  presence of infectious virus, or that virus nucleic acid is the cause of  clinical symptoms. Negative results do not preclude SARS-CoV-2 infection and  should not be used as the sole basis for diagnosis, treatment or other  patient management  decisions. Negative results must be combined with  clinical observations, patient history, and/or epidemiological information.  Invalid results may be due to inhibiting substances in the specimen and  recollection should occur. Please see Fact Sheets for patients and providers  located:  WirelessDSLBlog.no          Influenza A Not Detected     Influenza B Not Detected     Comment: Test performed using the Roche cobas Liat SARS-CoV-2 & Influenza A/B assay.  This assay is only for use under the Food and Drug Administration's  Emergency Use Authorization. This is a multiplex real-time RT-PCR assay  intended for the simultaneous in vitro qualitative detection and  differentiation  of SARS-CoV-2, influenza A, and influenza B virus RNA. Viral  nucleic acids may persist in vivo, independent of viability. Detection of  viral nucleic acid does not imply the presence of infectious virus, or that  virus nucleic acid is the cause of clinical symptoms. Negative results do  not preclude SARS-CoV-2, influenza A, and/or influenza B infection and  should not be used as the sole basis for diagnosis, treatment or other  patient management decisions. Negative results must be combined with  clinical observations, patient history, and/or epidemiological information.  Invalid results may be due to inhibiting substances in the specimen and  recollection should occur. Please see Fact Sheets for patients and providers  located: http://www.rice.biz/.         Narrative:      o Collect and clearly label specimen type:  o PREFERRED-Upper respiratory specimen: One Nasal Swab in  Transport Media.  o Hand deliver to laboratory ASAP  Diagnostic -PUI    Culture Blood Aerobic and Anaerobic [[id number]] Collected: 05/26/22 1526    Order Status: Completed Specimen: Arm from Blood, Venipuncture Updated: 05/27/22 2021    Narrative:      ORDER#: [id number]                                    ORDERED BY: Caroline More  SOURCE: Blood, Venipuncture Arm                      COLLECTED:  05/26/22 15:26  ANTIBIOTICS AT COLL.:                                RECEIVED :  05/26/22 19:56  Culture Blood Aerobic and Anaerobic        PRELIM      05/27/22 20:21  05/27/22   No Growth after 1 day/s of incubation.      Culture Blood Aerobic and Anaerobic [[id number]]     Order Status: Canceled Specimen: Arm from Blood, Venipuncture                IMAGING:  Images and report personally reviewed.  CT Chest Abdomen Pelvis WO Contrast   Final Result      1. Grade 3 pronounced right-sided hydronephrosis and diffuse ureterectasis   to the level of the right ureterovesical junction of uncertain etiology.   This is new from prior magnetic resonance imaging   2. Several small masses of the right kidney may represent small solid   masses or complex cysts. These could be further evaluated by renal magnetic   resonance imaging when patient is clinically stable      [name], MD   05/26/2022 4:47 PM      Chest AP Only   Final Result      No acute cardiopulmonary disease.      Fonnie Mu, DO   05/26/2022 4:03 PM

## 2022-05-27 NOTE — Progress Notes (Signed)
[facility]  PROGRESS NOTE      Patient: Shelby Barron  Date: 05/27/2022   LOS: 1 Days  Admission Date: 05/26/2022   MRN: [id number]  Attending: Lennox Pippins, MD  When on service as the attending, please contact me on Epic Secure Chat from 7 AM - 7 PM for non-urgent issues. For urgent matters use XTend page from 7 AM - 7 PM.       ASSESSMENT/PLAN     Shelby Barron is a 82 y.o. female admitted with Peritonitis    Interval Summary: 82 y.o. 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   Patient was sent to the ER by her nephrologist for dysuria and suprapubic pain peristent despite 5d course of Levaquin.  CT showing new finding of grade 3 pronounced right -sided hydronephrosis and diffuse ureterectasis to the level of the right UVJ and several small masses of the right kidney.     Patient Active Hospital Problem List:  Sepsis 2/2 UTI   - lactic normal, leukocytosis improving    - continue cefepime pending urine cultures     High grade urothelioma bladder CA   Obstructive uropathy with grade 3 hydronephrosis  RCC s/p partial nephrectomy    - Followed by Dr Celine Mans outpatient with plans for cystectomy and nephrectomy   - Urology consulted, since pain improved and no pyelo on imaging likely nothing to do for worsening hyrdo. Will await formal recs however   - hold plavix pending urology recs   - CTAP with grade 3 R sided hydronephrosis and ureterectasis to the R UVJ. Several small masses of the R kidney which may be solid masses vs complex cysts.    COPD   - stable   - resume albuterol PRN   - resume anoro ellipta & singulair per home dose     HTN  HLD  H/o CVA   - resume metoprolol per home dose   - resume atorvastatin per home dose   - holding plavix until urology eval in case invasive  procedure warranted     ESRD on PD  HAGMA 2/2 ESRD   - nephrology consulted, continue PD as ordered   - resume nephrovite, sensipar, renvela   - hold torsemide & aldactone given hypotension   - trend BMP     DMII   - continue reduced dose lantus 10 U daily   - low dose SSI   - carb consistent diet   - A1C 8.5     GERD   - resume PPI     Gout   - resume allopurinol     Iron deficient anemia  Anemia of chronic disease   - resume iron supplement   - follow h/h     - hold ESA given ca        Nutrition: renal diet          Patient has BMI=Body mass index is 33.91 kg/m.  Diagnosis: Obesity based on BMI criteria         Recent Labs   Lab 05/27/22  0431 05/26/22  1526   Hgb 9.5* 10.2*   Hematocrit 28.7* 31.5*   MCV 94.1 93.8   WBC 13.36* 17.54*   Platelets 246 284  Anemia Diagnosis: Chronic: Anemia of Chronic Kidney Disease    Code Status: Full Code    Dispo: pending    Family Contact: none    DVT Prophylaxis:   Current Facility-Administered Medications (Includes Only Anticoagulants, Misc. Hematological)   Medication Dose Route Last Admin    [Held by provider] clopidogrel (PLAVIX) tablet 75 mg  75 mg Oral      heparin (porcine) injection 5,000 Units  5,000 Units Subcutaneous 5,000 Units at 05/27/22 0541          CHART  REVIEW & DISCUSSION     The following chart items were reviewed as of 8:11 AM on 05/27/22:  []  Lab Results []  Imaging Results   []  Problem List  []  Current Orders []  Current Medications  []  Allergies  []  Code Status []  Previous Notes   []  SDoH    The management and plan of care for this patient was discussed with the following specialty consultants:  []  Cardiology  []  Gastroenterology                 []  Infectious Disease  []  Pulmonology []  Neurology                [x]  Nephrology  []  Neurosurgery []  Orthopedic Surgery  []  Heme/Onc  []  General Surgery []  Psychiatry                                   [x]  Urology    SUBJECTIVE     Shelby Barron still some suprapubic pain and dyuria  Otherwise feels  well    MEDICATIONS     Current Facility-Administered Medications   Medication Dose Route Frequency    allopurinol  100 mg Oral Daily    atorvastatin  40 mg Oral Daily    b complex-vitamin c-folic acid  1 tablet Oral Daily    cefepime  1 g Intravenous Q24H    cinacalcet  30 mg Oral Daily    [Held by provider] clopidogrel  75 mg Oral Daily    heparin (porcine)  5,000 Units Subcutaneous Q8H SCH    insulin lispro  1-3 Units Subcutaneous QHS    insulin lispro  1-5 Units Subcutaneous TID AC    iron polysaccharides  150 mg Oral Daily    metoprolol tartrate  12.5 mg Oral Daily    montelukast  10 mg Oral QHS    oxybutynin XL  5 mg Oral Daily    pantoprazole  40 mg Oral Q12H    senna-docusate  2 tablet Oral QHS    sevelamer carbonate  1,600 mg Oral BID AC    umeclidinium-vilanterol  1 puff Inhalation QAM    vancomycin   Intravenous See Admin Instructions       PHYSICAL EXAM     Vitals:    05/27/22 0731   BP: 101/57   Pulse: 89   Resp: 18   Temp: 98.6 F (37 C)   SpO2: 95%       Temperature: Temp  Min: 97.3 F (36.3 C)  Max: 98.6 F (37 C)  Pulse: Pulse  Min: 73  Max: 89  Respiratory: Resp  Min: 17  Max: 24  Non-Invasive BP: BP  Min: 92/60  Max: 123/63  Pulse Oximetry SpO2  Min: 95 %  Max: 100 %    Intake and Output Summary (Last 24 hours) at Date Time  No intake or output data  in the 24 hours ending 05/27/22 0811      GEN APPEARANCE: Normal;  A&OX3  HEENT: PERLA; EOMI; Conjunctiva Clear  NECK: Supple; No bruits  CVS: RRR, S1, S2; No M/G/R  LUNGS: CTAB; No Wheezes; No Rhonchi: No rales  ABD: Soft; No TTP; + Normoactive BS  EXT: No edema; Pulses 2+ and intact  NEURO: CN 2-12 intact; No Focal neurological deficits  MENTAL STATUS:     LABS     Recent Labs   Lab 05/27/22  0431 05/26/22  1526   WBC 13.36* 17.54*   RBC 3.05* 3.36*   Hgb 9.5* 10.2*   Hematocrit 28.7* 31.5*   MCV 94.1 93.8   Platelets 246 284       Recent Labs   Lab 05/27/22  0431 05/26/22  1526   Sodium 138 138   Potassium 4.3 5.0   Chloride 100 95*   CO2 23 25    BUN 57.0* 53.0*   Creatinine 14.4* 13.6*   Glucose 167* 98   Calcium 9.5 10.5*   Magnesium 2.5  --        Recent Labs   Lab 05/26/22  1526   ALT 13   AST (SGOT) 18   Bilirubin, Total 0.6   Albumin 3.2*   Alkaline Phosphatase 78       Recent Labs   Lab 05/26/22  1837 05/26/22  1526   hs Troponin-I 9.6 11.8   hs Troponin-I Delta -2.2  --              Microbiology Results (last 15 days)       Procedure Component Value Units Date/Time    Urine culture [[id number]] Collected: 05/26/22 2234    Order Status: No result Specimen: Urine Updated: 05/27/22 0221    Culture + Gram Stain,Aerobic, Body Fluid [[id number]]     Order Status: Sent Specimen: Body Fluid from Peritoneal Fluid (Abdominal)     Culture + Gram Stain,Aerobic, Body Fluid [[id number]]     Order Status: Sent Specimen: Body Fluid from Peritoneal Fluid (Abdominal)     Culture, Anaerobic Bacteria [[id number]]     Order Status: Sent Specimen: Other from Peritoneal Fluid (Abdominal)     Culture Blood Aerobic and Anaerobic [[id number]] Collected: 05/26/22 1604    Order Status: Sent Specimen: Arm from Blood, Venipuncture Updated: 05/26/22 1956    Narrative:      1 BLUE+1 PURPLE    COVID-19 (SARS-CoV-2) and Influenza A/B, NAA (Liat Rapid) [[id number]] Collected: 05/26/22 1535    Order Status: Completed Specimen: Culturette from Nasopharyngeal Updated: 05/26/22 1601     Purpose of COVID testing Diagnostic -PUI     SARS-CoV-2 Specimen Source Nasal Swab     SARS CoV 2 Overall Result Not Detected     Comment: __________________________________________________  -A result of "Detected" indicates POSITIVE for the    presence of SARS CoV-2 RNA  -A result of "Not Detected" indicates NEGATIVE for the    presence of SARS CoV-2 RNA  __________________________________________________________  Test performed using the Roche cobas Liat SARS-CoV-2 assay. This assay is  only for use under the Food and Drug Administration's Emergency Use  Authorization. This is a real-time RT-PCR assay for  the qualitative  detection of SARS-CoV-2 RNA. Viral nucleic acids may persist in vivo,  independent of viability. Detection of viral nucleic acid does not imply the  presence of infectious virus, or that virus nucleic acid is the cause of  clinical symptoms. Negative results do not preclude SARS-CoV-2  infection and  should not be used as the sole basis for diagnosis, treatment or other  patient management decisions. Negative results must be combined with  clinical observations, patient history, and/or epidemiological information.  Invalid results may be due to inhibiting substances in the specimen and  recollection should occur. Please see Fact Sheets for patients and providers  located:  WirelessDSLBlog.no          Influenza A Not Detected     Influenza B Not Detected     Comment: Test performed using the Roche cobas Liat SARS-CoV-2 & Influenza A/B assay.  This assay is only for use under the Food and Drug Administration's  Emergency Use Authorization. This is a multiplex real-time RT-PCR assay  intended for the simultaneous in vitro qualitative detection and  differentiation of SARS-CoV-2, influenza A, and influenza B virus RNA. Viral  nucleic acids may persist in vivo, independent of viability. Detection of  viral nucleic acid does not imply the presence of infectious virus, or that  virus nucleic acid is the cause of clinical symptoms. Negative results do  not preclude SARS-CoV-2, influenza A, and/or influenza B infection and  should not be used as the sole basis for diagnosis, treatment or other  patient management decisions. Negative results must be combined with  clinical observations, patient history, and/or epidemiological information.  Invalid results may be due to inhibiting substances in the specimen and  recollection should occur. Please see Fact Sheets for patients and providers  located: http://www.rice.biz/.         Narrative:      o Collect and clearly  label specimen type:  o PREFERRED-Upper respiratory specimen: One Nasal Swab in  Transport Media.  o Hand deliver to laboratory ASAP  Diagnostic -PUI    Culture Blood Aerobic and Anaerobic [[id number]] Collected: 02-Jun-2022 1526    Order Status: Sent Specimen: Arm from Blood, Venipuncture Updated: 02-Jun-2022 1956    Narrative:      1 BLUE+1 PURPLE    Culture Blood Aerobic and Anaerobic [[id number]]     Order Status: Canceled Specimen: Arm from Blood, Venipuncture              RADIOLOGY     CT Chest Abdomen Pelvis WO Contrast    Result Date: 06/02/22  1. Grade 3 pronounced right-sided hydronephrosis and diffuse ureterectasis to the level of the right ureterovesical junction of uncertain etiology. This is new from prior magnetic resonance imaging 2. Several small masses of the right kidney may represent small solid masses or complex cysts. These could be further evaluated by renal magnetic resonance imaging when patient is clinically stable [name], MD Jun 02, 2022 4:47 PM    Chest AP Only    Result Date: 02-Jun-2022  No acute cardiopulmonary disease. Fonnie Mu, DO 06/02/2022 4:03 PM   Echo Results       None          No results found for this or any previous visit.    Signed,  Lennox Pippins, MD  8:11 AM 05/27/2022

## 2022-05-27 NOTE — Plan of Care (Signed)
NURSING SHIFT NOTE     Patient: Shelby Barron  Day: 1      SHIFT EVENTS     Shift Narrative/Significant Events (PRN med administration, fall, RRT, etc.):     Patient is alert and oriented x 4. Denies pain. Vitals stable. PO medication taken whole with water and well tolerated.   Safety and fall precautions remain in place. Purposeful rounding completed.          ASSESSMENT     Changes in assessment from patient's baseline this shift:    Neuro: No  CV: No  Pulm: No  Peripheral Vascular: No  HEENT: No  GI: No  BM during shift: No, Last BM: Last BM Date: 05/26/22  GU: No   Integ: No  MS: No    Pain: None  Pain Interventions: None  Medications Utilized: none    Mobility: PMP Activity: Step 6 - Walks in Room of [employer] (ft) (Step 6,7): 10 Feet           Lines     Patient Lines/Drains/Airways Status       Active Lines, Drains and Airways       Name Placement date Placement time Site Days    Peripheral IV 05/26/22 20 G Standard Right Antecubital 05/26/22  1532  Antecubital  1    Peritoneal Dialysis Catheter Mid lower abdomen 05/26/22  2000  -- less than 1                         VITAL SIGNS     Vitals:    05/27/22 1608   BP: 100/57   Pulse: 90   Resp: 18   Temp: 97.3 F (36.3 C)   SpO2: 97%       Temp  Min: 97.3 F (36.3 C)  Max: 98.6 F (37 C)  Pulse  Min: 73  Max: 90  Resp  Min: 17  Max: 24  BP  Min: 100/57  Max: 123/63  SpO2  Min: 95 %  Max: 100 %      Intake/Output Summary (Last 24 hours) at 05/27/2022 1643  Last data filed at 05/27/2022 0900  Gross per 24 hour   Intake 240 ml   Output --   Net 240 ml              CARE PLAN       Problem: Pain interferes with ability to perform ADL  Goal: Pain at adequate level as identified by patient  Outcome: Progressing  Flowsheets (Taken 05/27/2022 1642)  Pain at adequate level as identified by patient: Identify patient comfort function goal     Problem: Side Effects from Pain Analgesia  Goal: Patient will experience minimal side effects of analgesic therapy  Outcome:  Progressing  Flowsheets (Taken 05/27/2022 1642)  Patient will experience minimal side effects of analgesic therapy:   Assess for changes in cognitive function   Monitor/assess patient's respiratory status (RR depth, effort, breath sounds)     Problem: Moderate/High Fall Risk Score >5  Goal: Patient will remain free of falls  Outcome: Progressing  Flowsheets (Taken 05/27/2022 0900 by Corrie Mckusick, RN)  Moderate Risk (6-13):   MOD- Consider video monitoring   MOD-include family in multidisciplinary POC discussions   MOD-Use gait belt when appropriate   MOD-Request PT/OT consult order for patients with gait/mobility impairment   MOD-Perform dangle, stand, walk (DSW) prior to mobilization   MOD-Utilize diversion activities   MOD-Re-orient  confused patients   MOD-Place bedside commode and assistive devices out of sight when not in use   MOD-Floor mat at bedside (where available) if appropriate   MOD-Consider a move closer to Nurses Station   MOD-Remain with patient during toileting   MOD-Apply bed exit alarm if patient is confused   MOD-Consider activation of bed alarm if appropriate     Problem: Patient Receiving Advanced Renal Therapies  Goal: Therapy access site remains intact  Outcome: Progressing  Flowsheets (Taken 05/27/2022 1642)  Therapy access site remains intact: Assess therapy access site     Problem: Fluid and Electrolyte Imbalance/ Endocrine  Goal: Fluid and electrolyte balance are achieved/maintained  Outcome: Progressing  Flowsheets (Taken 05/27/2022 0239 by Harmon Pier, RN)  Fluid and electrolyte balance are achieved/maintained:   Monitor/assess lab values and report abnormal values   Assess and reassess fluid and electrolyte status   Monitor for muscle weakness  Goal: Adequate hydration  Outcome: Progressing  Flowsheets (Taken 05/27/2022 0239 by Harmon Pier, RN)  Adequate hydration:   Assess mucus membranes, skin color, turgor, perfusion and presence of edema   Assess for peripheral, sacral,  periorbital and abdominal edema   Monitor and assess vital signs and perfusion

## 2022-05-27 NOTE — Progress Notes (Signed)
Initial Case Management Assessment and Discharge Planning [facility]   Patient Name: Shelby Barron, Shelby Barron   Date of Birth 04-11-1940   Attending Physician: Lennox Pippins, MD   Primary Care Physician: Patsy Lager, MD   Length of Stay 1   Reason for Consult / Chief Complaint IDPA        Situation   Admission DX:   1. Peritonitis    2. SOB (shortness of breath)        A/O Status: X 3    LACE Score: 4    Patient admitted from: ER  Admission Status: inpatient     Background     Advanced directive:   <no information>        Code Status:   Full Code     Residence: Multi-story home    PCP: PCP Not on File, MD  Patient Contact:   [phone] (home)     [phone] (mobile)     Emergency contact:   Extended Emergency Contact Information  Primary Emergency Contact: Vanetta Shawl, [place] [employer] Phone: [phone]  Relation: Daughter      ADL/IADL's: Independent  Previous Level of function: To be evaluated    DME: Rollator (4 wheeled walker)    Pharmacy:     [id number]ie Pai, [place] - [address] AT [facility] [address]  [address]  [place] [address]  Phone: [phone] Fax: [phone]      Prescription Coverage: Yes    Home Health: The patient is not currently receiving home health services.    Previous SNF/AR: [facility]     COVID Vaccine Status: Unknown    Date First IMM given: Unknown  UAI on file?: No  Transport for discharge? Mode of transportation: Sales executive - Family/Friend to drive patient  Agreeable to Home with family post-discharge:  Yes     Assessment   CM met and spoke with patient at bedside; Verified Face Sheet. Pt lives with her husband in a multilevel house; Self care; Independent. Pt has Hx of HH/SNF/DME use. Pt was staying with her dtr at [address], [place], [place] [address] prior to this hospitalization. Pt will stay with her dtr in [place] after this hospitalization. Pt has PD and [place], [place]  f/u with patient 2x/month. Pt's nephrologist is Dr. Wynelle Link- CM notified Gypsy Decant via secure Epic chat. CM provided Advance Directive Form today to patient  BARRIERS TO DISCHARGE: N/A     Recommendation   D/C Plan A: Home with family    D/C Plan B: Home with home health    D/C Plan C:       Natasha Bence, RN, BSN, CM  RN Care Manager I  [facility]  [phone]

## 2022-05-28 LAB — CBC
Absolute NRBC: 0 10*3/uL (ref 0.00–0.00)
Hematocrit: 29.4 % — ABNORMAL LOW (ref 34.7–43.7)
Hgb: 9.5 g/dL — ABNORMAL LOW (ref 11.4–14.8)
MCH: 30.8 pg (ref 25.1–33.5)
MCHC: 32.3 g/dL (ref 31.5–35.8)
MCV: 95.5 fL (ref 78.0–96.0)
MPV: 10 fL (ref 8.9–12.5)
Nucleated RBC: 0 /100 WBC (ref 0.0–0.0)
Platelets: 251 10*3/uL (ref 142–346)
RBC: 3.08 10*6/uL — ABNORMAL LOW (ref 3.90–5.10)
RDW: 15 % (ref 11–15)
WBC: 12.03 10*3/uL — ABNORMAL HIGH (ref 3.10–9.50)

## 2022-05-28 LAB — BASIC METABOLIC PANEL
Anion Gap: 17 — ABNORMAL HIGH (ref 5.0–15.0)
BUN: 56 mg/dL — ABNORMAL HIGH (ref 7.0–21.0)
CO2: 23 mEq/L (ref 17–29)
Calcium: 9.3 mg/dL (ref 7.9–10.2)
Chloride: 98 mEq/L — ABNORMAL LOW (ref 99–111)
Creatinine: 14.8 mg/dL — ABNORMAL HIGH (ref 0.4–1.0)
Glucose: 185 mg/dL — ABNORMAL HIGH (ref 70–100)
Potassium: 4.5 mEq/L (ref 3.5–5.3)
Sodium: 138 mEq/L (ref 135–145)
eGFR: 2.2 mL/min/{1.73_m2} — AB (ref >=60)

## 2022-05-28 LAB — WHOLE BLOOD GLUCOSE POCT
Whole Blood Glucose POCT: 150 mg/dL — ABNORMAL HIGH (ref 70–100)
Whole Blood Glucose POCT: 227 mg/dL — ABNORMAL HIGH (ref 70–100)
Whole Blood Glucose POCT: 169 mg/dL — ABNORMAL HIGH (ref 70–100)
Whole Blood Glucose POCT: 217 mg/dL — ABNORMAL HIGH (ref 70–100)

## 2022-05-28 MED ORDER — BISACODYL 10 MG RE SUPP
10.0000 mg | Freq: Once | RECTAL | Status: AC
Start: 2022-05-28 — End: 2022-05-28
  Administered 2022-05-28: 10 mg via RECTAL
  Filled 2022-05-28: qty 1

## 2022-05-28 MED ORDER — PROCHLORPERAZINE EDISYLATE 10 MG/2ML IJ SOLN
5.0000 mg | Freq: Once | INTRAMUSCULAR | Status: DC
Start: 2022-05-28 — End: 2022-06-04
  Filled 2022-05-28: qty 2

## 2022-05-28 MED ORDER — MINERAL OIL RE ENEM
1.0000 | ENEMA | Freq: Once | RECTAL | Status: DC
Start: 2022-05-28 — End: 2022-05-28
  Filled 2022-05-28: qty 1

## 2022-05-28 MED ORDER — MINERAL OIL RE ENEM
1.0000 | ENEMA | Freq: Once | RECTAL | Status: AC
Start: 2022-05-28 — End: 2022-05-28
  Administered 2022-05-28: 1 via RECTAL
  Filled 2022-05-28: qty 1

## 2022-05-28 MED ORDER — CLOPIDOGREL BISULFATE 75 MG PO TABS
75.0000 mg | ORAL_TABLET | Freq: Every day | ORAL | Status: DC
Start: 2022-05-28 — End: 2022-06-04
  Administered 2022-05-28 – 2022-06-04 (×7): 75 mg via ORAL
  Filled 2022-05-28 (×8): qty 1

## 2022-05-28 NOTE — Progress Notes (Signed)
CCPD started to dwell overnight, pt has slow flow error on drain phase,  pt site is open to air,  pt still constipated, report was given to primary nurse.       05/28/22 1920   Initiating APD (Putting the Patient ON)    $APD Start Time 1920   CCPD Total Treatment Time Ordered (hours) 10 hours   Number of Cycles/Exchanges Ordered 5   Total Fill Volume (mL) 10000 mL   Fluid Orders for APD/CCPD   Cycler Bag #1 1.5%, Low Calcium in 5000 mL   Cycler Bag #2 1.5%, Low Calcium in 5000 mL

## 2022-05-28 NOTE — Plan of Care (Signed)
NURSING SHIFT NOTE     Patient: Shelby Barron  Day: 2      SHIFT EVENTS     Shift Narrative/Significant Events (PRN med administration, fall, RRT, etc.):     Pt Aox4. VSS. Pt complained of abdominal pain. PRN Dilaudid given with good relief.pt complained of constipation. PRN Miralax, suppository and enema given. Pt did not have BM. MD notified. Scheduled medication given. Educated pt on each medication give. T verbalized understanding. Pt seen by PT today. Daughter visiting and updated. Encouraged pt to use call button for any assistance or questions. Call button within reach. Bed on lowest position.    Safety and fall precautions remain in place. Purposeful rounding completed.          ASSESSMENT     Changes in assessment from patient's baseline this shift:    Neuro: No  CV: No  Pulm: No  Peripheral Vascular: No  HEENT: No  GI: No  BM during shift: No, Last BM: Last BM Date: 05/26/22  GU: No   Integ: No  MS: No    Pain: Abdominal pain  Pain Interventions: Medications  Medications Utilized: Dilaudid intravenous    Mobility: PMP Activity: Step 6 - Walks in Room of Distance Walked (ft) (Step 6,7): 20 Feet           Lines     Patient Lines/Drains/Airways Status       Active Lines, Drains and Airways       Name Placement date Placement time Site Days    Peripheral IV 05/26/22 20 G Standard Right Antecubital 05/26/22  1532  Antecubital  2    Peritoneal Dialysis Catheter Mid lower abdomen 05/26/22  2000  -- 1                         VITAL SIGNS     Vitals:    05/28/22 1639   BP: 136/58   Pulse: (!) 104   Resp:    Temp: 98.4 F (36.9 C)   SpO2: (!) 84%       Temp  Min: 97.2 F (36.2 C)  Max: 98.4 F (36.9 C)  Pulse  Min: 85  Max: 104  Resp  Min: 18  Max: 20  BP  Min: 95/58  Max: 136/58  SpO2  Min: 84 %  Max: 100 %      Intake/Output Summary (Last 24 hours) at 05/28/2022 1734  Last data filed at 05/28/2022 1400  Gross per 24 hour   Intake 468 ml   Output --   Net 468 ml            Problem: Pain interferes with ability to  perform ADL  Goal: Pain at adequate level as identified by patient  Outcome: Progressing  Flowsheets (Taken 05/28/2022 1733)  Pain at adequate level as identified by patient:   Identify patient comfort function goal   Assess for risk of opioid induced respiratory depression, including snoring/sleep apnea. Alert healthcare team of risk factors identified.   Assess pain on admission, during daily assessment and/or before any "as needed" intervention(s)   Evaluate if patient comfort function goal is met   Reassess pain within 30-60 minutes of any procedure/intervention, per Pain Assessment, Intervention, Reassessment (AIR) Cycle   Offer non-pharmacological pain management interventions   Evaluate patient's satisfaction with pain management progress     Problem: Side Effects from Pain Analgesia  Goal: Patient will experience minimal side effects of analgesic  therapy  Outcome: Progressing  Flowsheets (Taken 05/28/2022 1733)  Patient will experience minimal side effects of analgesic therapy:   Monitor/assess patient's respiratory status (RR depth, effort, breath sounds)   Assess for changes in cognitive function     Problem: Moderate/High Fall Risk Score >5  Goal: Patient will remain free of falls  Outcome: Progressing  Flowsheets (Taken 05/28/2022 0800)  Moderate Risk (6-13):   MOD- Consider video monitoring   MOD-include family in multidisciplinary POC discussions   MOD-Use gait belt when appropriate   MOD-Request PT/OT consult order for patients with gait/mobility impairment   MOD-Perform dangle, stand, walk (DSW) prior to mobilization   MOD-Utilize diversion activities   MOD-Re-orient confused patients   MOD-Place bedside commode and assistive devices out of sight when not in use   MOD-Remain with patient during toileting   MOD-Consider a move closer to Nurses Station   MOD-Apply bed exit alarm if patient is confused   MOD-Floor mat at bedside (where available) if appropriate   MOD-Consider activation of bed alarm if  appropriate     Problem: Patient Receiving Advanced Renal Therapies  Goal: Therapy access site remains intact  Outcome: Progressing  Flowsheets (Taken 05/28/2022 1733)  Therapy access site remains intact: Assess therapy access site     Problem: Compromised Activity/Mobility  Goal: Activity/Mobility Interventions  Outcome: Progressing  Flowsheets (Taken 05/28/2022 1733)  Activity/Mobility Interventions: Pad bony prominences, TAP Seated positioning system when OOB, Promote PMP, Reposition q 2 hrs / turn clock, Offload heels     Problem: Fluid and Electrolyte Imbalance/ Endocrine  Goal: Fluid and electrolyte balance are achieved/maintained  Outcome: Progressing  Flowsheets (Taken 05/28/2022 1733)  Fluid and electrolyte balance are achieved/maintained:   Monitor/assess lab values and report abnormal values   Assess and reassess fluid and electrolyte status   Monitor for muscle weakness  Goal: Adequate hydration  Outcome: Progressing  Flowsheets (Taken 05/28/2022 1733)  Adequate hydration:   Assess mucus membranes, skin color, turgor, perfusion and presence of edema   Assess for peripheral, sacral, periorbital and abdominal edema   Monitor and assess vital signs and perfusion       CARE PLAN

## 2022-05-28 NOTE — Plan of Care (Addendum)
NURSING SHIFT NOTE     Patient: Shelby Barron  Day: 2      SHIFT EVENTS     Shift Narrative/Significant Events (PRN med administration, fall, RRT, etc.):     Assumed pt responsibility, axo x4, on room air, scheduled medications given as ordered, not on tele, comfort measures provided to pt. No other changes to report on pts condition. VS stable, pt stable.   2140: daughter called asking about pt not having a bowel movement for 3 days, verified daughter was on mother emergency contact list,  daughter informed me on Monday pt possibly drank a large amount of imodium, which could be the reason she is constipated now. Daughter educated on pts care as far as stool softners, laxatives, and enemas go that the pt has received. Pt also shows possible diverticulitis from abdominal CT. No other changes to report on pts condition,. Monitoring pt for bowel movement Medication needs time to work for pt.   Daughter would also like to discuss with case management, power of attorney paperwork       Safety and fall precautions remain in place. Purposeful rounding completed.          ASSESSMENT     Changes in assessment from patient's baseline this shift:    Neuro: No  CV: No  Pulm: No  Peripheral Vascular: No  HEENT: No  GI: No  BM during shift: No, Last BM: Last BM Date: 05/26/22  GU: No   Integ: No  MS: No    Pain: None  Pain Interventions: None  Medications Utilized:     Mobility: PMP Activity: Step 6 - Walks in Room of Distance Walked (ft) (Step 6,7): 20 Feet           Lines     Patient Lines/Drains/Airways Status       Active Lines, Drains and Airways       Name Placement date Placement time Site Days    Peripheral IV 05/26/22 20 G Standard Right Antecubital 05/26/22  1532  Antecubital  2    Peritoneal Dialysis Catheter Mid lower abdomen 05/26/22  2000  -- 2                         VITAL SIGNS     Vitals:    05/28/22 1939   BP: 125/66   Pulse: 68   Resp: 17   Temp: 97.5 F (36.4 C)   SpO2: 98%       Temp  Min: 97.2 F (36.2  C)  Max: 98.4 F (36.9 C)  Pulse  Min: 68  Max: 104  Resp  Min: 17  Max: 20  BP  Min: 95/58  Max: 136/58  SpO2  Min: 84 %  Max: 100 %      Intake/Output Summary (Last 24 hours) at 05/28/2022 2120  Last data filed at 05/28/2022 1400  Gross per 24 hour   Intake 468 ml   Output --   Net 468 ml            Patient self-proning?: No:      Patient using incentive spirometer?: No:    Amount achieved on incentive spirometer?: No data recorded      CARE PLAN        Problem: Pain interferes with ability to perform ADL  Goal: Pain at adequate level as identified by patient  Outcome: Progressing  Flowsheets (Taken 05/28/2022 1733 by Corrie Mckusick, RN)  Pain at adequate level as identified by patient:   Identify patient comfort function goal   Assess for risk of opioid induced respiratory depression, including snoring/sleep apnea. Alert healthcare team of risk factors identified.   Assess pain on admission, during daily assessment and/or before any "as needed" intervention(s)   Evaluate if patient comfort function goal is met   Reassess pain within 30-60 minutes of any procedure/intervention, per Pain Assessment, Intervention, Reassessment (AIR) Cycle   Offer non-pharmacological pain management interventions   Evaluate patient's satisfaction with pain management progress     Problem: Side Effects from Pain Analgesia  Goal: Patient will experience minimal side effects of analgesic therapy  Outcome: Progressing  Flowsheets (Taken 05/28/2022 1733 by Corrie Mckusick, RN)  Patient will experience minimal side effects of analgesic therapy:   Monitor/assess patient's respiratory status (RR depth, effort, breath sounds)   Assess for changes in cognitive function     Problem: Moderate/High Fall Risk Score >5  Goal: Patient will remain free of falls  Outcome: Progressing  Flowsheets (Taken 05/28/2022 2000)  Moderate Risk (6-13):   MOD-Consider activation of bed alarm if appropriate   MOD-Apply bed exit alarm if patient is  confused   MOD-Floor mat at bedside (where available) if appropriate     Problem: Patient Receiving Advanced Renal Therapies  Goal: Therapy access site remains intact  Outcome: Progressing  Flowsheets (Taken 05/28/2022 1926 by Winfield Rast, RN)  Therapy access site remains intact: Assess therapy access site     Problem: Compromised Activity/Mobility  Goal: Activity/Mobility Interventions  Outcome: Progressing  Flowsheets (Taken 05/28/2022 1733 by Corrie Mckusick, RN)  Activity/Mobility Interventions: Pad bony prominences, TAP Seated positioning system when OOB, Promote PMP, Reposition q 2 hrs / turn clock, Offload heels     Problem: Fluid and Electrolyte Imbalance/ Endocrine  Goal: Fluid and electrolyte balance are achieved/maintained  Outcome: Progressing  Flowsheets (Taken 05/28/2022 1733 by Corrie Mckusick, RN)  Fluid and electrolyte balance are achieved/maintained:   Monitor/assess lab values and report abnormal values   Assess and reassess fluid and electrolyte status   Monitor for muscle weakness  Goal: Adequate hydration  Outcome: Progressing  Flowsheets (Taken 05/28/2022 1733 by Corrie Mckusick, RN)  Adequate hydration:   Assess mucus membranes, skin color, turgor, perfusion and presence of edema   Assess for peripheral, sacral, periorbital and abdominal edema   Monitor and assess vital signs and perfusion

## 2022-05-28 NOTE — Progress Notes (Signed)
[facility]  PROGRESS NOTE      Patient: Shelby Barron  Date: 05/28/2022   LOS: 2 Days  Admission Date: 05/26/2022   MRN: [id number]  Attending: Lennox Pippins, MD  When on service as the attending, please contact me on Epic Secure Chat from 7 AM - 7 PM for non-urgent issues. For urgent matters use XTend page from 7 AM - 7 PM.       ASSESSMENT/PLAN     Shelby Barron is a 82 y.o. female admitted with Peritonitis    Interval Summary: 82 y.o. 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   Patient was sent to the ER by her nephrologist for dysuria and suprapubic pain peristent despite 5d course of Levaquin.  CT showing new finding of grade 3 pronounced right -sided hydronephrosis and diffuse ureterectasis to the level of the right UVJ and several small masses of the right kidney. Urology consulted and did not recommend any acute intervention this admission for worsening hydro, just continue treatment with empiric antibiotics for UTI.     To Do:  Follow up pain and urine cultures  Likely can [place] home tmrw if pain improved & has BM    Patient Active Hospital Problem List:  Sepsis 2/2 UTI resolved   - lactic normal, leukocytosis improving    - continue cefepime (5/9-) pending urine cultures     High grade urothelioma bladder CA   Obstructive uropathy with grade 3 hydronephrosis  RCC s/p partial nephrectomy    - Followed by Dr Shelby Barron outpatient with plans for cystectomy and nephrectomy   - Urology consulted, since no pyelo on imaging likely nothing to do for worsening hyrdo this admission   - CTAP with grade 3 R sided hydronephrosis and ureterectasis to the R UVJ. Several small masses of the R kidney which may be solid masses vs complex cysts.  - Pain control as ordered. Pt does not feel her  pain is well controlled today, required IV pain medication today 5/10    COPD   - stable   - resume albuterol PRN   - resume anoro ellipta & singulair per home dose     HTN  HLD  H/o CVA   - resume metoprolol per home dose   - resume atorvastatin per home dose     ESRD on PD  HAGMA 2/2 ESRD   - nephrology consulted, continue PD as ordered   - resume nephrovite, sensipar, renvela   - hold torsemide & aldactone given soft Bps on admission   - trend BMP     DMII   - continue reduced dose lantus 10 U daily   - low dose SSI   - carb consistent diet   - A1C 8.5     GERD   - resume PPI     Gout   - resume allopurinol     Iron deficient anemia  Anemia of chronic disease   - resume iron supplement   - follow h/h     - hold ESA given ca        Nutrition: renal diet          Patient has BMI=Body mass index is 33.91 kg/m.  Diagnosis: Obesity based on BMI criteria  Recent Labs   Lab 05/27/22  0431 05/26/22  1526   Hgb 9.5* 10.2*   Hematocrit 28.7* 31.5*   MCV 94.1 93.8   WBC 13.36* 17.54*   Platelets 246 284         Anemia Diagnosis: Chronic: Anemia of Chronic Kidney Disease    Code Status: Full Code    Dispo: pending    Family Contact: none    DVT Prophylaxis:   Current Facility-Administered Medications (Includes Only Anticoagulants, Misc. Hematological)   Medication Dose Route Last Admin    [Held by provider] clopidogrel (PLAVIX) tablet 75 mg  75 mg Oral      heparin (porcine) injection 5,000 Units  5,000 Units Subcutaneous 5,000 Units at 05/28/22 0608          CHART  REVIEW & DISCUSSION     The following chart items were reviewed as of 9:16 AM on 05/28/22:  [x]  Lab Results [x]  Imaging Results   []  Problem List  [x]  Current Orders [x]  Current Medications  []  Allergies  [x]  Code Status [x]  Previous Notes   []  SDoH    The management and plan of care for this patient was discussed with the following specialty consultants:  []  Cardiology  []  Gastroenterology                 []  Infectious Disease  []  Pulmonology []   Neurology                [x]  Nephrology  []  Neurosurgery []  Orthopedic Surgery  []  Heme/Onc  []  General Surgery []  Psychiatry                                   [x]  Urology    SUBJECTIVE     Shelby Barron   Barron having abdominal pain LUQ and suprabupic. Does not feel comfortable with [place] today as a result of pain. Also reports she has not had a BM in several days despite miralax, requesting suppository as this may be exacerbating her abdominal pain she feels    MEDICATIONS     Current Facility-Administered Medications   Medication Dose Route Frequency    allopurinol  100 mg Oral Daily    atorvastatin  40 mg Oral Daily    b complex-vitamin c-folic acid  1 tablet Oral Daily    cefepime  1 g Intravenous Q24H    cinacalcet  30 mg Oral Daily    [Held by provider] clopidogrel  75 mg Oral Daily    heparin (porcine)  5,000 Units Subcutaneous Q8H SCH    insulin glargine  10 Units Subcutaneous QAM    insulin lispro  1-3 Units Subcutaneous QHS    insulin lispro  1-5 Units Subcutaneous TID AC    iron polysaccharides  150 mg Oral Daily    metoprolol tartrate  12.5 mg Oral Daily    montelukast  10 mg Oral QHS    oxybutynin XL  5 mg Oral Daily    pantoprazole  40 mg Oral Q12H    senna-docusate  2 tablet Oral QHS    sevelamer carbonate  1,600 mg Oral BID AC    umeclidinium-vilanterol  1 puff Inhalation QAM       PHYSICAL EXAM     Vitals:    05/28/22 0852   BP:    Pulse:    Resp: 18   Temp:    SpO2:  Temperature: Temp  Min: 97.2 F (36.2 C)  Max: 98.1 F (36.7 C)  Pulse: Pulse  Min: 85  Max: 100  Respiratory: Resp  Min: 2  Max: 19  Non-Invasive BP: BP  Min: 95/58  Max: 135/69  Pulse Oximetry SpO2  Min: 93 %  Max: 98 %    Intake and Output Summary (Last 24 hours) at Date Time    Intake/Output Summary (Last 24 hours) at 05/28/2022 0916  Last data filed at 05/27/2022 2211  Gross per 24 hour   Intake 118 ml   Output --   Net 118 ml         GEN APPEARANCE: Normal;  A&OX3  HEENT: PERLA; EOMI; Conjunctiva Clear  NECK: Supple; No  bruits  CVS: RRR, S1, S2; No M/G/R  LUNGS: CTAB; No Wheezes; No Rhonchi: No rales  ABD: Soft; No TTP; + Normoactive BS  EXT: No edema; Pulses 2+ and intact  NEURO: CN 2-12 intact; No Focal neurological deficits  MENTAL STATUS:     LABS     Recent Labs   Lab 05/27/22  0431 05/26/22  1526   WBC 13.36* 17.54*   RBC 3.05* 3.36*   Hgb 9.5* 10.2*   Hematocrit 28.7* 31.5*   MCV 94.1 93.8   Platelets 246 284       Recent Labs   Lab 05/27/22  0431 05/26/22  1526   Sodium 138 138   Potassium 4.3 5.0   Chloride 100 95*   CO2 23 25   BUN 57.0* 53.0*   Creatinine 14.4* 13.6*   Glucose 167* 98   Calcium 9.5 10.5*   Magnesium 2.5  --        Recent Labs   Lab 05/26/22  1526   ALT 13   AST (SGOT) 18   Bilirubin, Total 0.6   Albumin 3.2*   Alkaline Phosphatase 78       Recent Labs   Lab 05/26/22  1837 05/26/22  1526   hs Troponin-I 9.6 11.8   hs Troponin-I Delta -2.2  --              Microbiology Results (last 15 days)       Procedure Component Value Units Date/Time    Culture + Gram Stain,Aerobic, Body Fluid [[id number]] Collected: 05/27/22 1321    Order Status: Completed Specimen: Body Fluid from Peritoneal Fluid (Abdominal) Updated: 05/27/22 1812    Narrative:      ORDER#: [id number]                                    ORDERED BY: Georgiana Shore  SOURCE: Peritoneal Fluid (Abdominal) peritoneal fluidCOLLECTED:  05/27/22 13:21  ANTIBIOTICS AT COLL.:                                RECEIVED :  05/27/22 16:33  Stain, Gram                                FINAL       05/27/22 18:12  05/27/22   Few WBCs             No organisms seen             Stain performed on Cytospin (concentrated) specimen  Culture and Gram Stain,  Aerobic, Body FluidPENDING      Urine culture [[id number]] Collected: 05/26/22 2234    Order Status: No result Specimen: Urine Updated: 05/27/22 1413    Culture + Gram Stain,Aerobic, Body Fluid [[id number]]     Order Status: Sent Specimen: Body Fluid from Peritoneal Fluid (Abdominal)     Culture, Anaerobic Bacteria [[id number]]      Order Status: Sent Specimen: Other from Peritoneal Fluid (Abdominal)     Culture Blood Aerobic and Anaerobic [[id number]] Collected: 05/26/22 1604    Order Status: Completed Specimen: Arm from Blood, Venipuncture Updated: 05/27/22 2021    Narrative:      ORDER#: [id number]                                    ORDERED BY: Caroline More  SOURCE: Blood, Venipuncture Arm                      COLLECTED:  05/26/22 16:04  ANTIBIOTICS AT COLL.:                                RECEIVED :  05/26/22 19:56  Culture Blood Aerobic and Anaerobic        PRELIM      05/27/22 20:21  05/27/22   No Growth after 1 day/s of incubation.      COVID-19 (SARS-CoV-2) and Influenza A/B, NAA (Liat Rapid) [[id number]] Collected: 05/26/22 1535    Order Status: Completed Specimen: Culturette from Nasopharyngeal Updated: 05/26/22 1601     Purpose of COVID testing Diagnostic -[facility]I     SARS-CoV-2 Specimen Source Nasal Swab     SARS CoV 2 Overall Result Not Detected     Comment: __________________________________________________  -A result of "Detected" indicates POSITIVE for the    presence of SARS CoV-2 RNA  -A result of "Not Detected" indicates NEGATIVE for the    presence of SARS CoV-2 RNA  __________________________________________________________  Test performed using the Roche cobas Liat SARS-CoV-2 assay. This assay is  only for use under the Food and Drug Administration's Emergency Use  Authorization. This is a real-time RT-PCR assay for the qualitative  detection of SARS-CoV-2 RNA. Viral nucleic acids may persist in vivo,  independent of viability. Detection of viral nucleic acid does not imply the  presence of infectious virus, or that virus nucleic acid is the cause of  clinical symptoms. Negative results do not preclude SARS-CoV-2 infection and  should not be used as the sole basis for diagnosis, treatment or other  patient management decisions. Negative results must be combined with  clinical observations, patient history, and/or  epidemiological information.  Invalid results may be due to inhibiting substances in the specimen and  recollection should occur. Please see Fact Sheets for patients and providers  located:  WirelessDSLBlog.no          Influenza A Not Detected     Influenza B Not Detected     Comment: Test performed using the Roche cobas Liat SARS-CoV-2 & Influenza A/B assay.  This assay is only for use under the Food and Drug Administration's  Emergency Use Authorization. This is a multiplex real-time RT-PCR assay  intended for the simultaneous in vitro qualitative detection and  differentiation of SARS-CoV-2, influenza A, and influenza B virus RNA. Viral  nucleic acids may persist in vivo, independent of viability. Detection of  viral  nucleic acid does not imply the presence of infectious virus, or that  virus nucleic acid is the cause of clinical symptoms. Negative results do  not preclude SARS-CoV-2, influenza A, and/or influenza B infection and  should not be used as the sole basis for diagnosis, treatment or other  patient management decisions. Negative results must be combined with  clinical observations, patient history, and/or epidemiological information.  Invalid results may be due to inhibiting substances in the specimen and  recollection should occur. Please see Fact Sheets for patients and providers  located: http://www.rice.biz/.         Narrative:      o Collect and clearly label specimen type:  o PREFERRED-Upper respiratory specimen: One Nasal Swab in  Transport Media.  o Hand deliver to laboratory ASAP  Diagnostic -[facility]    Culture Blood Aerobic and Anaerobic [[id number]] Collected: 22-Jun-2022 1526    Order Status: Completed Specimen: Arm from Blood, Venipuncture Updated: 05/27/22 2021    Narrative:      ORDER#: [id number]                                    ORDERED BY: Caroline More  SOURCE: Blood, Venipuncture Arm                      COLLECTED:  2022/06/22 15:26  ANTIBIOTICS  AT COLL.:                                RECEIVED :  22-Jun-2022 19:56  Culture Blood Aerobic and Anaerobic        PRELIM      05/27/22 20:21  05/27/22   No Growth after 1 day/s of incubation.      Culture Blood Aerobic and Anaerobic [[id number]]     Order Status: Canceled Specimen: Arm from Blood, Venipuncture              RADIOLOGY     CT Chest Abdomen Pelvis WO Contrast    Result Date: 06-22-22  1. Grade 3 pronounced right-sided hydronephrosis and diffuse ureterectasis to the level of the right ureterovesical junction of uncertain etiology. This is new from prior magnetic resonance imaging 2. Several small masses of the right kidney may represent small solid masses or complex cysts. These could be further evaluated by renal magnetic resonance imaging when patient is clinically stable [name], MD June 22, 2022 4:47 PM    Chest AP Only    Result Date: 06-22-22  No acute cardiopulmonary disease. Fonnie Mu, DO Jun 22, 2022 4:03 PM   Echo Results       None          No results found for this or any previous visit.    Signed,  Shelby Pippins, MD  9:16 AM 05/28/2022

## 2022-05-28 NOTE — PT Eval Note (Signed)
[facility]  Physical Therapy Attempt Note    Patient:  Shelby Barron MRN#:  [id number]  Unit:  [facility] 4 NORTH Room/Bed:  A4027/A4027.01      Physical Therapy evaluation attempted on 05/28/2022 at 8:11 PM.     PT Visit Cancellation Reason: Testing/Procedure (HD)         Physical and/or occupational therapy will follow up at a later time/date as able.     RN aware.   Rondel Baton, MS, MPT  Physical Therapist 2  Spectra [id number]  Monday 13:30 - 21:00  Tuesday, Wednesday 14:00-21:00  Thursday, Friday 10:30 - 21:00

## 2022-05-28 NOTE — Progress Notes (Signed)
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      Recommendations:     Cont PD as ordered  Cont renvela  Abx x 10 days  GU surgery as outpt  OK for d/c at any time from a renal standpoint      Case discussed with: pt      Debbe Mounts, MD  [facility]  [id number] (office)      Subjective:   No c/o    Review of Systems:   No sob      Physical Exam:     Vitals:    05/28/22 0920 05/28/22 1221 05/28/22 1251 05/28/22 1300   BP: 105/70 114/66  110/90   Pulse: 88 95     Resp:   18    Temp:  98.4 F (36.9 C)     TempSrc:  Oral     SpO2:  100%     Weight:       Height:           Intake and Output Summary (Last 24 hours) at Date Time    Intake/Output Summary (Last 24 hours) at 05/28/2022 1312  Last data filed at 05/27/2022 2211  Gross per 24 hour   Intake 118 ml   Output --   Net 118 ml       General: awake, alert, oriented x 3, no acute distress  Cardiovascular: regular rate and rhythm, no murmurs, rubs or gallops  Lungs: clear to auscultation bilaterally, without wheezing, rhonchi, or rales  Abdomen: soft, non-tender, non-distended, normoactive bowel sounds  Extremities: no edema      Meds:      Scheduled Meds: PRN Meds:    allopurinol, 100 mg, Oral, Daily  atorvastatin, 40 mg, Oral, Daily  b complex-vitamin c-folic acid, 1 tablet, Oral, Daily  cefepime, 1 g, Intravenous, Q24H  cinacalcet, 30 mg, Oral, Daily  clopidogrel, 75 mg, Oral, Daily  heparin (porcine), 5,000 Units, Subcutaneous, Q8H SCH  insulin glargine, 10 Units, Subcutaneous, QAM  insulin lispro, 1-3 Units, Subcutaneous, QHS  insulin lispro, 1-5 Units,  Subcutaneous, TID AC  iron polysaccharides, 150 mg, Oral, Daily  metoprolol tartrate, 12.5 mg, Oral, Daily  montelukast, 10 mg, Oral, QHS  oxybutynin XL, 5 mg, Oral, Daily  pantoprazole, 40 mg, Oral, Q12H  senna-docusate, 2 tablet, Oral, QHS  sevelamer carbonate, 1,600 mg, Oral, BID AC  umeclidinium-vilanterol, 1 puff, Inhalation, QAM          Continuous Infusions:   dianeal low calcium (2.5 mEq/L) 1.5% dextrose      dianeal low calcium (2.5 mEq/L) 1.5% dextrose      dianeal low calcium (2.5 mEq/L) 1.5% dextrose      acetaminophen, 650 mg, Q6H PRN  benzocaine-menthol, 1 lozenge, Q2H PRN  benzonatate, 100 mg, TID PRN  carboxymethylcellulose sodium, 1 drop, TID PRN  dextrose, 15 g of glucose, PRN   Or  dextrose, 12.5 g, PRN   Or  dextrose, 12.5 g, PRN   Or  glucagon (rDNA), 1 mg, PRN  diphenhydrAMINE, 25 mg, QHS PRN  HYDROmorphone, 0.2 mg, Q4H PRN  melatonin, 3 mg, QHS PRN  naloxone, 0.2 mg, PRN  ondansetron, 4 mg, Q6H PRN   Or  ondansetron, 4 mg, Q6H PRN  oxyCODONE, 5 mg, Q6H PRN  polyethylene glycol, 17 g, Daily PRN  saline, 2 spray, Q4H PRN              Labs:     Recent Labs   Lab 05/28/22  0943 05/27/22  0431 05/26/22  1526   WBC 12.03* 13.36* 17.54*   Hgb 9.5* 9.5* 10.2*   Hematocrit 29.4* 28.7* 31.5*   Platelets 251 246 284     Recent Labs   Lab 05/28/22  0943 05/27/22  0431 05/26/22  1526   Sodium 138 138 138   Potassium 4.5 4.3 5.0   Chloride 98* 100 95*   CO2 23 23 25    BUN 56.0* 57.0* 53.0*   Creatinine 14.8* 14.4* 13.6*   Calcium 9.3 9.5 10.5*   Albumin  --   --  3.2*   Phosphorus  --  6.7*  --    Magnesium  --  2.5  --    Glucose 185* 167* 98   eGFR 2.2* 2.3* 2.4*       Recent Labs   Lab 05/26/22  2234   Urine Type Urine, Catheriz   Color, UA Orange*   Clarity, UA Turbid*   Specific Gravity UA 1.020   Urine pH 6.5   Nitrite, UA Negative   Ketones UA Negative   Urobilinogen, UA Normal   Bilirubin, UA Negative   Blood, UA Moderate*   RBC, UA TNTC*   WBC, UA TNTC*           Imaging personally reviewed,  including: No results found.        Signed by: Debbe Mounts, MD

## 2022-05-28 NOTE — Plan of Care (Signed)
NURSING SHIFT NOTE     Patient: Shelby Barron  Day: 2      SHIFT EVENTS     Shift Narrative/Significant Events (PRN med administration, fall, RRT, etc.):       Patient is alert and oriented x4. On room air. Afebrile. Ambulating with x1 assistance. Pain managed with PRN acetaminophen. PD ongoing, denying any discomfort associated. PD machine is alarming at frequent intervals, per on call PD nurse this is unavoidable.  Plan of care discussed with patient, care ongoing.   Safety and fall precautions remain in place. Purposeful rounding completed.          ASSESSMENT     Changes in assessment from patient's baseline this shift:    Neuro: No  CV: No  Pulm: No  Peripheral Vascular: No  HEENT: No  GI: No  BM during shift: No, Last BM: Last BM Date: 05/26/22  GU: No   Integ: No  MS: No    Pain: Improved  Pain Interventions: Medications, Rest, and Positioning  Medications Utilized: Acetaminophen oral    Mobility: PMP Activity: Step 6 - Walks in Room of Distance Walked (ft) (Step 6,7): 20 Feet           Lines     Patient Lines/Drains/Airways Status       Active Lines, Drains and Airways       Name Placement date Placement time Site Days    Peripheral IV 05/26/22 20 G Standard Right Antecubital 05/26/22  1532  Antecubital  1    Peritoneal Dialysis Catheter Mid lower abdomen 05/26/22  2000  -- 1                         VITAL SIGNS     Vitals:    05/28/22 0517   BP: 135/69   Pulse: 96   Resp: 18   Temp: 98.1 F (36.7 C)   SpO2: 93%       Temp  Min: 97.3 F (36.3 C)  Max: 98.6 F (37 C)  Pulse  Min: 85  Max: 100  Resp  Min: 14  Max: 19  BP  Min: 95/58  Max: 135/69  SpO2  Min: 93 %  Max: 98 %      Intake/Output Summary (Last 24 hours) at 05/28/2022 0640  Last data filed at 05/27/2022 2211  Gross per 24 hour   Intake 358 ml   Output --   Net 358 ml          CARE PLAN     Problem: Pain interferes with ability to perform ADL  Goal: Pain at adequate level as identified by patient  Outcome: Progressing  Flowsheets (Taken  05/28/2022 0237)  Pain at adequate level as identified by patient:   Identify patient comfort function goal   Assess for risk of opioid induced respiratory depression, including snoring/sleep apnea. Alert healthcare team of risk factors identified.   Assess pain on admission, during daily assessment and/or before any "as needed" intervention(s)   Reassess pain within 30-60 minutes of any procedure/intervention, per Pain Assessment, Intervention, Reassessment (AIR) Cycle   Evaluate if patient comfort function goal is met   Evaluate patient's satisfaction with pain management progress   Offer non-pharmacological pain management interventions   Include patient/patient care companion in decisions related to pain management as needed     Problem: Side Effects from Pain Analgesia  Goal: Patient will experience minimal side effects of analgesic therapy  Outcome: Progressing  Flowsheets (Taken 05/28/2022 0237)  Patient will experience minimal side effects of analgesic therapy:   Monitor/assess patient's respiratory status (RR depth, effort, breath sounds)   Assess for changes in cognitive function     Problem: Moderate/High Fall Risk Score >5  Goal: Patient will remain free of falls  Outcome: Progressing  Flowsheets (Taken 05/27/2022 2225)  Moderate Risk (6-13):   MOD-Use gait belt when appropriate   MOD-Place bedside commode and assistive devices out of sight when not in use   MOD-Consider activation of bed alarm if appropriate   MOD-Perform dangle, stand, walk (DSW) prior to mobilization     Problem: Patient Receiving Advanced Renal Therapies  Goal: Therapy access site remains intact  Outcome: Progressing  Flowsheets (Taken 05/28/2022 0237)  Therapy access site remains intact: Assess therapy access site     Problem: Compromised Activity/Mobility  Goal: Activity/Mobility Interventions  Outcome: Progressing  Flowsheets (Taken 05/28/2022 0237)  Activity/Mobility Interventions: Pad bony prominences, TAP Seated positioning system  when OOB, Promote PMP, Reposition q 2 hrs / turn clock, Offload heels     Problem: Fluid and Electrolyte Imbalance/ Endocrine  Goal: Fluid and electrolyte balance are achieved/maintained  Outcome: Progressing  Flowsheets (Taken 05/28/2022 0237)  Fluid and electrolyte balance are achieved/maintained:   Monitor/assess lab values and report abnormal values   Assess and reassess fluid and electrolyte status   Monitor for muscle weakness  Goal: Adequate hydration  Outcome: Progressing  Flowsheets (Taken 05/28/2022 0237)  Adequate hydration:   Monitor and assess vital signs and perfusion   Assess for peripheral, sacral, periorbital and abdominal edema   Assess mucus membranes, skin color, turgor, perfusion and presence of edema

## 2022-05-28 NOTE — Progress Notes (Signed)
Pt. Disconnected at 1230. Pt. Having difficulty in all drain cycles. Pt. Is having to push resume multiple times to drain. Pt. Is c/o of constipation and is having pain in back under her left ribs due to ?Marland Kitchen Effluent clear, yellow. -243 ml. Will notify nephrologist. Cindie Laroche RN given report.    05/28/22 1241   Completing APD/CCPD (Taking the Patient OFF)    Effluent Appearance Clear;Yellow   Specimen Collected No   Peritoneal Dialysis Comments cycler with difficulty draining   Peritoneal Dialysis Catheter Mid lower abdomen   Placement Date/Time: 05/26/22 2000   Present on Admission?: Yes  Catheter Location: Mid lower abdomen   Site Assessment Clean;Dry;Intact

## 2022-05-28 NOTE — Plan of Care (Signed)
Problem: Patient Receiving Advanced Renal Therapies  Goal: Therapy access site remains intact  Flowsheets (Taken 05/28/2022 1926)  Therapy access site remains intact: Assess therapy access site

## 2022-05-28 NOTE — PT Eval Note (Signed)
[facility]  Physical Therapy Attempt Note    Patient:  Shelby Barron MRN#:  [id number]  Unit:  [facility] 4 NORTH Room/Bed:  A4027/A4027.01      Physical Therapy evaluation attempted on 05/28/2022 at 3:01 PM.     PT Visit Cancellation Reason: Patient/caregiver declines therapy at this time (pt currently attempting BM. Daughter requesting PT/OT following enema. RN reporting enema is scheduled for 5pm.)         Physical and/or occupational therapy will follow up at a later time/date as able.     RN aware.    Desma Maxim, PT, DPT    Physical Medicine and Rehabilitation   [facility]   [phone]    05/28/2022  3:02 PM

## 2022-05-29 ENCOUNTER — Inpatient Hospital Stay: Payer: No Typology Code available for payment source

## 2022-05-29 LAB — CBC AND DIFFERENTIAL
Absolute NRBC: 0 10*3/uL (ref 0.00–0.00)
Basophils Absolute Automated: 0.07 10*3/uL (ref 0.00–0.08)
Basophils Automated: 0.6 %
Eosinophils Absolute Automated: 0.01 10*3/uL (ref 0.00–0.44)
Eosinophils Automated: 0.1 %
Hematocrit: 28.1 % — ABNORMAL LOW (ref 34.7–43.7)
Hgb: 9 g/dL — ABNORMAL LOW (ref 11.4–14.8)
Immature Granulocytes Absolute: 0.1 10*3/uL — ABNORMAL HIGH (ref 0.00–0.07)
Immature Granulocytes: 0.9 %
Instrument Absolute Neutrophil Count: 7.46 10*3/uL — ABNORMAL HIGH (ref 1.10–6.33)
Lymphocytes Absolute Automated: 2.37 10*3/uL (ref 0.42–3.22)
Lymphocytes Automated: 21 %
MCH: 30.4 pg (ref 25.1–33.5)
MCHC: 32 g/dL (ref 31.5–35.8)
MCV: 94.9 fL (ref 78.0–96.0)
MPV: 10 fL (ref 8.9–12.5)
Monocytes Absolute Automated: 1.29 10*3/uL — ABNORMAL HIGH (ref 0.21–0.85)
Monocytes: 11.4 %
Neutrophils Absolute: 7.46 10*3/uL — ABNORMAL HIGH (ref 1.10–6.33)
Neutrophils: 66 %
Nucleated RBC: 0 /100 WBC (ref 0.0–0.0)
Platelets: 232 10*3/uL (ref 142–346)
RBC: 2.96 10*6/uL — ABNORMAL LOW (ref 3.90–5.10)
RDW: 15 % (ref 11–15)
WBC: 11.3 10*3/uL — ABNORMAL HIGH (ref 3.10–9.50)

## 2022-05-29 LAB — WHOLE BLOOD GLUCOSE POCT
Whole Blood Glucose POCT: 151 mg/dL — ABNORMAL HIGH (ref 70–100)
Whole Blood Glucose POCT: 164 mg/dL — ABNORMAL HIGH (ref 70–100)
Whole Blood Glucose POCT: 125 mg/dL — ABNORMAL HIGH (ref 70–100)
Whole Blood Glucose POCT: 143 mg/dL — ABNORMAL HIGH (ref 70–100)

## 2022-05-29 LAB — VANCOMYCIN, RANDOM: Vancomycin Random: 15.9 ug/mL

## 2022-05-29 MED ORDER — PIPERACILLIN-TAZOBACTAM 2.25 GM MBP (CNR)
2.2500 g | Freq: Three times a day (TID) | INTRAVENOUS | Status: DC
Start: 2022-05-29 — End: 2022-06-01
  Administered 2022-05-29 – 2022-06-01 (×8): 2250 mg via INTRAVENOUS
  Filled 2022-05-29 (×10): qty 2250

## 2022-05-29 NOTE — Plan of Care (Signed)
Problem: Patient Receiving Advanced Renal Therapies  Goal: Therapy access site remains intact  Outcome: Progressing  Flowsheets (Taken 05/29/2022 1835)  Therapy access site remains intact:   Assess therapy access site   Change therapy access site dressing as needed

## 2022-05-29 NOTE — Progress Notes (Signed)
[facility]  PROGRESS NOTE      Patient: Shelby Barron  Date: 05/29/2022   LOS: 3 Days  Admission Date: 05/26/2022   MRN: [id number]  Attending: Gayleen Orem, MD  When on service as the attending, please contact me on Epic Secure Chat from 7 AM - 7 PM for non-urgent issues. For urgent matters use XTend page from 7 AM - 7 PM.       ASSESSMENT/PLAN     Shelby Barron is a 82 y.o. female admitted with Peritonitis    Interval Summary: 82 y.o. 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   Patient was sent to the ER by her nephrologist for dysuria and suprapubic pain peristent despite 5d course of Levaquin.  CT showing new finding of grade 3 pronounced right -sided hydronephrosis and diffuse ureterectasis to the level of the right UVJ and several small masses of the right kidney. Urology consulted and did not recommend any acute intervention this admission for worsening hydro, just continue treatment with empiric antibiotics for UTI.         Patient Active Hospital Problem List:  Sepsis 2/2 UTI resolved   - lactic normal, leukocytosis improving   -Blood culture and urine culture remain negative  -Given patient failed outpatient Levaquin infectious disease consulted recommended to continue Zosyn over the weekend       High grade urothelioma bladder CA   Obstructive uropathy with grade 3 hydronephrosis  RCC s/p partial nephrectomy    - Followed by Dr Celine Mans outpatient with plans for cystectomy and nephrectomy   - Urology consulted, since no pyelo on imaging likely nothing to do for worsening hyrdo this admission   - CTAP with grade 3 R sided hydronephrosis and ureterectasis to the R UVJ. Several small masses of the R kidney which may be solid masses vs complex cysts.  - Pain control  as ordered. Pt does not feel her pain is well controlled today, required IV pain medication today 5/10    Malfunctioning peritoneal dialysis portal  At this point peritoneal dialysis fairly working per nephrology and recommend to continue  Ordered abdominal x-ray to see if it is kinked if not possible manipulation by IR as an outpatient    COPD   - stable   - resume albuterol PRN   - resume anoro ellipta & singulair per home dose     HTN  HLD  H/o CVA  Continue metoprolol/atorvastatin     ESRD on PD  HAGMA 2/2 ESRD   - nephrology consulted, continue PD as ordered   - resume nephrovite, sensipar, renvela   - hold torsemide & aldactone given soft Bps on admission   - trend BMP     DMII   - continue reduced dose lantus 10 U daily   - low dose SSI   - carb consistent diet   - A1C 8.5     GERD   - Continue PPI     Gout   - Continue allopurinol     Iron deficient anemia  Anemia of chronic disease   - resume iron supplement   - follow h/h     - hold ESA given ca        Nutrition: renal  diet          Patient has BMI=Body mass index is 33.91 kg/m.  Diagnosis: Obesity based on BMI criteria             Recent Labs   Lab 05/29/22  0903 05/28/22  0943 05/27/22  0431   Hgb 9.0* 9.5* 9.5*   Hematocrit 28.1* 29.4* 28.7*   MCV 94.9 95.5 94.1   WBC 11.30* 12.03* 13.36*   Platelets 232 251 246         Anemia Diagnosis: Chronic: Anemia of Chronic Kidney Disease    Code Status: Full Code    Dispo: pending    Family Contact: Daughter at the bedside    DVT Prophylaxis:   Current Facility-Administered Medications (Includes Only Anticoagulants, Misc. Hematological)   Medication Dose Route Last Admin    clopidogrel (PLAVIX) tablet 75 mg  75 mg Oral 75 mg at 05/29/22 0804    heparin (porcine) injection 5,000 Units  5,000 Units Subcutaneous 5,000 Units at 05/29/22 8295          CHART  REVIEW & DISCUSSION     The following chart items were reviewed as of 4:52 PM on 05/29/22:  [x]  Lab Results [x]  Imaging Results   []  Problem List  [x]  Current  Orders [x]  Current Medications  []  Allergies  [x]  Code Status [x]  Previous Notes   []  SDoH    The management and plan of care for this patient was discussed with the following specialty consultants:  []  Cardiology  []  Gastroenterology                 []  Infectious Disease  []  Pulmonology []  Neurology                [x]  Nephrology  []  Neurosurgery []  Orthopedic Surgery  []  Heme/Onc  []  General Surgery []  Psychiatry                                   [x]  Urology    SUBJECTIVE     Shelby Barron   Patient reported that she had good bowel movement this morning  Stated that she the peritoneal dialysis is not getting a good amount of fluid  Other than that denies fever, chest pain, lightheadedness, nausea or vomiting    MEDICATIONS     Current Facility-Administered Medications   Medication Dose Route Frequency    allopurinol  100 mg Oral Daily    atorvastatin  40 mg Oral Daily    b complex-vitamin c-folic acid  1 tablet Oral Daily    cinacalcet  30 mg Oral Daily    clopidogrel  75 mg Oral Daily    heparin (porcine)  5,000 Units Subcutaneous Q8H SCH    insulin glargine  10 Units Subcutaneous QAM    insulin lispro  1-3 Units Subcutaneous QHS    insulin lispro  1-5 Units Subcutaneous TID AC    iron polysaccharides  150 mg Oral Daily    metoprolol tartrate  12.5 mg Oral Daily    montelukast  10 mg Oral QHS    oxybutynin XL  5 mg Oral Daily    pantoprazole  40 mg Oral Q12H    piperacillin-tazobactam  2.25 g Intravenous Q8H    prochlorperazine  5 mg Intravenous Once    senna-docusate  2 tablet Oral QHS    sevelamer carbonate  1,600 mg Oral BID AC  umeclidinium-vilanterol  1 puff Inhalation QAM       PHYSICAL EXAM     Vitals:    05/29/22 1556   BP: 128/66   Pulse: 68   Resp: 18   Temp: 97.3 F (36.3 C)   SpO2: 100%       Temperature: Temp  Min: 97.3 F (36.3 C)  Max: 98.2 F (36.8 C)  Pulse: Pulse  Min: 68  Max: 97  Respiratory: Resp  Min: 17  Max: 18  Non-Invasive BP: BP  Min: 105/60  Max: 132/68  Pulse Oximetry SpO2  Min:  95 %  Max: 100 %    Intake and Output Summary (Last 24 hours) at Date Time  No intake or output data in the 24 hours ending 05/29/22 1652        GEN APPEARANCE: Normal;  A&OX3  HEENT: PERLA; EOMI; Conjunctiva Clear  NECK: Supple; No bruits  CVS: RRR, S1, S2; No M/G/R  LUNGS: CTAB; No Wheezes; No Rhonchi: No rales  ABD: Soft; No TTP; + Normoactive BS  EXT: No edema; Pulses 2+ and intact  NEURO: CN 2-12 intact; No Focal neurological deficits  MENTAL STATUS: Conversant and cooperative    LABS     Recent Labs   Lab 05/29/22  0903 05/28/22  0943 05/27/22  0431   WBC 11.30* 12.03* 13.36*   RBC 2.96* 3.08* 3.05*   Hgb 9.0* 9.5* 9.5*   Hematocrit 28.1* 29.4* 28.7*   MCV 94.9 95.5 94.1   Platelets 232 251 246       Recent Labs   Lab 05/28/22  0943 05/27/22  0431 05/26/22  1526   Sodium 138 138 138   Potassium 4.5 4.3 5.0   Chloride 98* 100 95*   CO2 23 23 25    BUN 56.0* 57.0* 53.0*   Creatinine 14.8* 14.4* 13.6*   Glucose 185* 167* 98   Calcium 9.3 9.5 10.5*   Magnesium  --  2.5  --        Recent Labs   Lab 05/26/22  1526   ALT 13   AST (SGOT) 18   Bilirubin, Total 0.6   Albumin 3.2*   Alkaline Phosphatase 78       Recent Labs   Lab 05/26/22  1837 05/26/22  1526   hs Troponin-I 9.6 11.8   hs Troponin-I Delta -2.2  --              Microbiology Results (last 15 days)       Procedure Component Value Units Date/Time    Culture + Gram Azzie Glatter Body Fluid [[id number]] Collected: 05/27/22 1321    Order Status: Completed Specimen: Body Fluid from Peritoneal Fluid (Abdominal) Updated: 05/29/22 1543    Narrative:      ORDER#: [id number]                                    ORDERED BY: Georgiana Shore  SOURCE: Peritoneal Fluid (Abdominal) peritoneal fluidCOLLECTED:  05/27/22 13:21  ANTIBIOTICS AT COLL.:                                RECEIVED :  05/27/22 16:33  Stain, Gram  FINAL       05/27/22 18:12  05/27/22   Few WBCs             No organisms seen             Stain performed on Cytospin (concentrated)  specimen  Culture and Gram Stain, Aerobic, Body FluidPRELIM      05/29/22 15:42  05/28/22   Culture no growth to date. Final report to follow  05/29/22   Culture no growth to date. Final report to follow      Urine culture [[id number]] Collected: 05/26/22 2234    Order Status: Completed Specimen: Bladder Updated: 05/28/22 1742    Narrative:      ORDER#: [id number]                                    ORDERED BY: Leigh Aurora, JODY  SOURCE: Urine                                        COLLECTED:  05/26/22 22:34  ANTIBIOTICS AT COLL.:                                RECEIVED :  05/27/22 14:13  Culture Urine                              FINAL       05/28/22 17:42  05/28/22   10,000 - 30,000 CFU/MLof normal urogenital or skin microbiota, no             further work      Culture + Gram Stain,Aerobic, Body Fluid [[id number]]     Order Status: Sent Specimen: Body Fluid from Peritoneal Fluid (Abdominal)     Culture, Anaerobic Bacteria [[id number]]     Order Status: Sent Specimen: Other from Peritoneal Fluid (Abdominal)     Culture Blood Aerobic and Anaerobic [[id number]] Collected: 05/26/22 1604    Order Status: Completed Specimen: Arm from Blood, Venipuncture Updated: 05/28/22 2021    Narrative:      ORDER#: [id number]                                    ORDERED BY: Caroline More  SOURCE: Blood, Venipuncture Arm                      COLLECTED:  05/26/22 16:04  ANTIBIOTICS AT COLL.:                                RECEIVED :  05/26/22 19:56  Culture Blood Aerobic and Anaerobic        PRELIM      05/28/22 20:21  05/27/22   No Growth after 1 day/s of incubation.  05/28/22   No Growth after 2 day/s of incubation.      COVID-19 (SARS-CoV-2) and Influenza A/B, NAA (Liat Rapid) [[id number]] Collected: 05/26/22 1535    Order Status: Completed Specimen: Culturette from Nasopharyngeal Updated: 05/26/22 1601     Purpose of COVID testing Diagnostic -[facility]     SARS-CoV-2 Specimen  Source Nasal Swab     SARS CoV 2 Overall Result Not Detected     Comment:  __________________________________________________  -A result of "Detected" indicates POSITIVE for the    presence of SARS CoV-2 RNA  -A result of "Not Detected" indicates NEGATIVE for the    presence of SARS CoV-2 RNA  __________________________________________________________  Test performed using the Roche cobas Liat SARS-CoV-2 assay. This assay is  only for use under the Food and Drug Administration's Emergency Use  Authorization. This is a real-time RT-PCR assay for the qualitative  detection of SARS-CoV-2 RNA. Viral nucleic acids may persist in vivo,  independent of viability. Detection of viral nucleic acid does not imply the  presence of infectious virus, or that virus nucleic acid is the cause of  clinical symptoms. Negative results do not preclude SARS-CoV-2 infection and  should not be used as the sole basis for diagnosis, treatment or other  patient management decisions. Negative results must be combined with  clinical observations, patient history, and/or epidemiological information.  Invalid results may be due to inhibiting substances in the specimen and  recollection should occur. Please see Fact Sheets for patients and providers  located:  WirelessDSLBlog.no          Influenza A Not Detected     Influenza B Not Detected     Comment: Test performed using the Roche cobas Liat SARS-CoV-2 & Influenza A/B assay.  This assay is only for use under the Food and Drug Administration's  Emergency Use Authorization. This is a multiplex real-time RT-PCR assay  intended for the simultaneous in vitro qualitative detection and  differentiation of SARS-CoV-2, influenza A, and influenza B virus RNA. Viral  nucleic acids may persist in vivo, independent of viability. Detection of  viral nucleic acid does not imply the presence of infectious virus, or that  virus nucleic acid is the cause of clinical symptoms. Negative results do  not preclude SARS-CoV-2, influenza A, and/or influenza B  infection and  should not be used as the sole basis for diagnosis, treatment or other  patient management decisions. Negative results must be combined with  clinical observations, patient history, and/or epidemiological information.  Invalid results may be due to inhibiting substances in the specimen and  recollection should occur. Please see Fact Sheets for patients and providers  located: http://www.rice.biz/.         Narrative:      o Collect and clearly label specimen type:  o PREFERRED-Upper respiratory specimen: One Nasal Swab in  Transport Media.  o Hand deliver to laboratory ASAP  Diagnostic -[facility]    Culture Blood Aerobic and Anaerobic [[id number]] Collected: 05/26/22 1526    Order Status: Completed Specimen: Arm from Blood, Venipuncture Updated: 05/28/22 2021    Narrative:      ORDER#: [id number]                                    ORDERED BY: Caroline More  SOURCE: Blood, Venipuncture Arm                      COLLECTED:  05/26/22 15:26  ANTIBIOTICS AT COLL.:                                RECEIVED :  05/26/22 19:56  Culture Blood Aerobic and Anaerobic  PRELIM      05/28/22 20:21  05/27/22   No Growth after 1 day/s of incubation.  05/28/22   No Growth after 2 day/s of incubation.      Culture Blood Aerobic and Anaerobic [[id number]]     Order Status: Canceled Specimen: Arm from Blood, Venipuncture              RADIOLOGY     CT Chest Abdomen Pelvis WO Contrast    Result Date: 06/11/22  1. Grade 3 pronounced right-sided hydronephrosis and diffuse ureterectasis to the level of the right ureterovesical junction of uncertain etiology. This is new from prior magnetic resonance imaging 2. Several small masses of the right kidney may represent small solid masses or complex cysts. These could be further evaluated by renal magnetic resonance imaging when patient is clinically stable [name], MD Jun 11, 2022 4:47 PM    Chest AP Only    Result Date: Jun 11, 2022  No acute cardiopulmonary disease.  Fonnie Mu, DO Jun 11, 2022 4:03 PM   Echo Results       None          No results found for this or any previous visit.    Signed,  Gayleen Orem, MD  4:52 PM 05/29/2022

## 2022-05-29 NOTE — Progress Notes (Signed)
[facility] PROGRESS NOTE  [id number]      Date Time: 05/29/22 12:08 PM  Patient Name: Shelby Barron  Attending Physician: Gayleen Orem, MD    CC: follow-up ESRD    Assessment:     ESRD - cont CCPD order though slow drain limiting now (only completed 4/5 drains).  Had BM today.  Will check AXR.  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  If constipated, will need more aggressive bowel regimen.  If not constipated and no kink noted, getting enough PD now that could likely arrange wire manipulation as outpt at [facility]  Cont renvela  Abx per ID (seeing now)      Case discussed with: pt, family, PD RN and IM      Debbe Mounts, MD  [facility]  [id number] (office)      Subjective:   No c/o    Review of Systems:   No sob      Physical Exam:     Vitals:    05/28/22 1939 05/28/22 2344 05/29/22 0737 05/29/22 1000   BP: 125/66 132/68 118/67    Pulse: 68 97 88    Resp: 17 17 18     Temp: 97.5 F (36.4 C) 97.7 F (36.5 C) 98.2 F (36.8 C)    TempSrc: Oral Oral Oral    SpO2: 98% 97% 96% 96%   Weight:       Height:           Intake and Output Summary (Last 24 hours) at Date Time    Intake/Output Summary (Last 24 hours) at 05/29/2022 1208  Last data filed at 05/28/2022 1400  Gross per 24 hour   Intake 150 ml   Output --   Net 150 ml       General: awake, alert, oriented x 3, no acute distress  Cardiovascular: regular rate and rhythm, no murmurs, rubs or gallops  Lungs: clear to auscultation bilaterally, without wheezing, rhonchi, or rales  Abdomen: soft, non-tender, non-distended, normoactive bowel sounds  Extremities: no edema      Meds:      Scheduled Meds: PRN Meds:    allopurinol, 100 mg, Oral,  Daily  atorvastatin, 40 mg, Oral, Daily  b complex-vitamin c-folic acid, 1 tablet, Oral, Daily  cefepime, 1 g, Intravenous, Q24H  cinacalcet, 30 mg, Oral, Daily  clopidogrel, 75 mg, Oral, Daily  heparin (porcine), 5,000 Units, Subcutaneous, Q8H SCH  insulin glargine, 10 Units, Subcutaneous, QAM  insulin lispro, 1-3 Units, Subcutaneous, QHS  insulin lispro, 1-5 Units, Subcutaneous, TID AC  iron polysaccharides, 150 mg, Oral, Daily  metoprolol tartrate, 12.5 mg, Oral, Daily  montelukast, 10 mg, Oral, QHS  oxybutynin XL, 5 mg, Oral, Daily  pantoprazole, 40 mg, Oral, Q12H  prochlorperazine, 5 mg, Intravenous, Once  senna-docusate, 2 tablet, Oral, QHS  sevelamer carbonate, 1,600 mg, Oral, BID AC  umeclidinium-vilanterol, 1 puff, Inhalation, QAM          Continuous Infusions:   dianeal low calcium (2.5 mEq/L) 1.5% dextrose      dianeal low calcium (2.5 mEq/L) 1.5% dextrose      dianeal low calcium (2.5  mEq/L) 1.5% dextrose      acetaminophen, 650 mg, Q6H PRN  benzocaine-menthol, 1 lozenge, Q2H PRN  benzonatate, 100 mg, TID PRN  carboxymethylcellulose sodium, 1 drop, TID PRN  dextrose, 15 g of glucose, PRN   Or  dextrose, 12.5 g, PRN   Or  dextrose, 12.5 g, PRN   Or  glucagon (rDNA), 1 mg, PRN  diphenhydrAMINE, 25 mg, QHS PRN  HYDROmorphone, 0.2 mg, Q4H PRN  melatonin, 3 mg, QHS PRN  naloxone, 0.2 mg, PRN  ondansetron, 4 mg, Q6H PRN   Or  ondansetron, 4 mg, Q6H PRN  oxyCODONE, 5 mg, Q6H PRN  polyethylene glycol, 17 g, Daily PRN  saline, 2 spray, Q4H PRN              Labs:     Recent Labs   Lab 05/29/22  0903 05/28/22  0943 05/27/22  0431   WBC 11.30* 12.03* 13.36*   Hgb 9.0* 9.5* 9.5*   Hematocrit 28.1* 29.4* 28.7*   Platelets 232 251 246     Recent Labs   Lab 05/28/22  0943 05/27/22  0431 05/26/22  1526   Sodium 138 138 138   Potassium 4.5 4.3 5.0   Chloride 98* 100 95*   CO2 23 23 25    BUN 56.0* 57.0* 53.0*   Creatinine 14.8* 14.4* 13.6*   Calcium 9.3 9.5 10.5*   Albumin  --   --  3.2*   Phosphorus  --  6.7*  --     Magnesium  --  2.5  --    Glucose 185* 167* 98   eGFR 2.2* 2.3* 2.4*       Recent Labs   Lab 05/26/22  2234   Urine Type Urine, Catheriz   Color, UA Orange*   Clarity, UA Turbid*   Specific Gravity UA 1.020   Urine pH 6.5   Nitrite, UA Negative   Ketones UA Negative   Urobilinogen, UA Normal   Bilirubin, UA Negative   Blood, UA Moderate*   RBC, UA TNTC*   WBC, UA TNTC*           Imaging personally reviewed, including: No results found.        Signed by: Debbe Mounts, MD

## 2022-05-29 NOTE — Plan of Care (Signed)
NURSING SHIFT NOTE     Patient: Shelby Barron  Day: 3      SHIFT EVENTS     Shift Narrative/Significant Events (PRN med administration, fall, RRT, etc.):     Assumed pt responsibility, axo x4, on room air, VS stable, pt stable, daughter and husband at bedside. Scheduled medications given as ordered, daughter was requesting information on how to get a home nurse due to her not knowing how to work her mom dialysis machine. No other changes to report on pts condiiton.     Safety and fall precautions remain in place. Purposeful rounding completed.          ASSESSMENT     Changes in assessment from patient's baseline this shift:    Neuro: No  CV: No  Pulm: No  Peripheral Vascular: No  HEENT: No  GI: No  BM during shift: No, Last BM: Last BM Date: 05/29/22  GU: No   Integ: No  MS: No    Pain: Improved  Pain Interventions: Medications  Medications Utilized: Percocet oral    Mobility: PMP Activity: Step 6 - Walks in Room of [employer] (ft) (Step 6,7): 20 Feet           Lines     Patient Lines/Drains/Airways Status       Active Lines, Drains and Airways       Name Placement date Placement time Site Days    Peripheral IV 05/29/22 20 G Right Antecubital 05/29/22  1620  Antecubital  less than 1    Peritoneal Dialysis Catheter Mid lower abdomen 05/26/22  2000  -- 3                         VITAL SIGNS     Vitals:    05/29/22 1941   BP: 117/70   Pulse: 78   Resp: 17   Temp: 97.5 F (36.4 C)   SpO2: 98%       Temp  Min: 97.3 F (36.3 C)  Max: 98.2 F (36.8 C)  Pulse  Min: 68  Max: 97  Resp  Min: 17  Max: 18  BP  Min: 117/70  Max: 132/68  SpO2  Min: 96 %  Max: 100 %      Intake/Output Summary (Last 24 hours) at 05/29/2022 2136  Last data filed at 05/29/2022 1230  Gross per 24 hour   Intake 480 ml   Output --   Net 480 ml          Patient self-proning?: yes    Patient using incentive spirometer?: No:    Amount achieved on incentive spirometer?: No data recorded      CARE PLAN        Problem: Pain interferes with ability to  perform ADL  Goal: Pain at adequate level as identified by patient  Outcome: Progressing  Flowsheets (Taken 05/29/2022 0800 by Viann Shove, RN)  Pain at adequate level as identified by patient: Identify patient comfort function goal     Problem: Side Effects from Pain Analgesia  Goal: Patient will experience minimal side effects of analgesic therapy  Outcome: Progressing  Flowsheets (Taken 05/29/2022 0800 by Viann Shove, RN)  Patient will experience minimal side effects of analgesic therapy: Monitor/assess patient's respiratory status (RR depth, effort, breath sounds)     Problem: Moderate/High Fall Risk Score >5  Goal: Patient will remain free of falls  Outcome: Progressing  Flowsheets (Taken 05/28/2022 2000)  Moderate  Risk (6-13):   MOD-Consider activation of bed alarm if appropriate   MOD-Apply bed exit alarm if patient is confused   MOD-Floor mat at bedside (where available) if appropriate     Problem: Patient Receiving Advanced Renal Therapies  Goal: Therapy access site remains intact  Outcome: Progressing  Flowsheets (Taken 05/29/2022 1835 by Silverio Lay, RN)  Therapy access site remains intact:   Assess therapy access site   Change therapy access site dressing as needed     Problem: Compromised Activity/Mobility  Goal: Activity/Mobility Interventions  Outcome: Progressing  Flowsheets (Taken 05/28/2022 1733 by Corrie Mckusick, RN)  Activity/Mobility Interventions: Pad bony prominences, TAP Seated positioning system when OOB, Promote PMP, Reposition q 2 hrs / turn clock, Offload heels     Problem: Fluid and Electrolyte Imbalance/ Endocrine  Goal: Fluid and electrolyte balance are achieved/maintained  Outcome: Progressing  Flowsheets (Taken 05/28/2022 1733 by Corrie Mckusick, RN)  Fluid and electrolyte balance are achieved/maintained:   Monitor/assess lab values and report abnormal values   Assess and reassess fluid and electrolyte status   Monitor for muscle weakness  Goal: Adequate  hydration  Outcome: Progressing  Flowsheets (Taken 05/28/2022 1733 by Corrie Mckusick, RN)  Adequate hydration:   Assess mucus membranes, skin color, turgor, perfusion and presence of edema   Assess for peripheral, sacral, periorbital and abdominal edema   Monitor and assess vital signs and perfusion

## 2022-05-29 NOTE — Plan of Care (Signed)
NURSING SHIFT NOTE     Patient: Shelby Barron  Day: 3      SHIFT EVENTS     Shift Narrative/Significant Events (PRN med administration, fall, RRT, etc.):     Patient is alert and oriented 4. Reports pain in mid abdominal area. Tylenol given with effect. Vitals stable   Safety and fall precautions remain in place. Purposeful rounding completed.          ASSESSMENT     Changes in assessment from patient's baseline this shift:    Neuro: No  CV: No  Pulm: No  Peripheral Vascular: No  HEENT: No  GI: No  BM during shift: No, Last BM: Last BM Date: 05/29/22  GU: No   Integ: No  MS: No    Pain: Improved  Pain Interventions: Medications  Medications Utilized: Tylenol PO    Mobility: PMP Activity: Step 6 - Walks in Room of Distance Walked (ft) (Step 6,7): 20 Feet           Lines     Patient Lines/Drains/Airways Status       Active Lines, Drains and Airways       Name Placement date Placement time Site Days    Peripheral IV 05/28/22 20 G Left;Posterior Forearm 05/28/22  2210  Forearm  less than 1    Peritoneal Dialysis Catheter Mid lower abdomen 05/26/22  2000  -- 2                         VITAL SIGNS     Vitals:    05/29/22 1210   BP: 120/70   Pulse: 71   Resp: 18   Temp: 97.3 F (36.3 C)   SpO2: 96%       Temp  Min: 97.3 F (36.3 C)  Max: 98.4 F (36.9 C)  Pulse  Min: 68  Max: 104  Resp  Min: 17  Max: 18  BP  Min: 105/60  Max: 136/58  SpO2  Min: 84 %  Max: 98 %      Intake/Output Summary (Last 24 hours) at 05/29/2022 1257  Last data filed at 05/28/2022 1400  Gross per 24 hour   Intake 150 ml   Output --   Net 150 ml                CARE PLAN       Problem: Pain interferes with ability to perform ADL  Goal: Pain at adequate level as identified by patient  Outcome: Progressing  Flowsheets (Taken 05/29/2022 0800)  Pain at adequate level as identified by patient: Identify patient comfort function goal     Problem: Side Effects from Pain Analgesia  Goal: Patient will experience minimal side effects of analgesic therapy  Outcome:  Progressing  Flowsheets (Taken 05/29/2022 0800)  Patient will experience minimal side effects of analgesic therapy: Monitor/assess patient's respiratory status (RR depth, effort, breath sounds)     Problem: Moderate/High Fall Risk Score >5  Goal: Patient will remain free of falls  Outcome: Progressing  Flowsheets (Taken 05/28/2022 2000 by Maye Hides, RN)  Moderate Risk (6-13):   MOD-Consider activation of bed alarm if appropriate   MOD-Apply bed exit alarm if patient is confused   MOD-Floor mat at bedside (where available) if appropriate     Problem: Fluid and Electrolyte Imbalance/ Endocrine  Goal: Adequate hydration  Outcome: Progressing

## 2022-05-29 NOTE — Progress Notes (Signed)
CCPD started to continue overnight. Catheter open to air. Still having slow drain.     05/29/22 1800   Initiating APD (Putting the Patient ON)    $APD Start Time 1800   CCPD Total Treatment Time Ordered (hours) 10 hours   Number of Cycles/Exchanges Ordered 5   Total Fill Volume (mL) 10000 mL   Fluid Orders for APD/CCPD   Cycler Bag #1 1.5%, Low Calcium in 5000 mL   Cycler Bag #2 1.5%, Low Calcium in 5000 mL   Completing APD/CCPD (Taking the Patient OFF)    Peritoneal Dialysis Comments CCPD started   Peritoneal Dialysis Catheter Mid lower abdomen   Placement Date/Time: 05/26/22 2000   Present on Admission?: Yes  Catheter Location: Mid lower abdomen   Site Assessment Clean;Dry;Intact   Dressing Open to air (None)

## 2022-05-29 NOTE — Progress Notes (Signed)
Arrived in room, pt alert and oriented.  Family at bedside and dr. Janice Norrie was having conversation with the family and pt.  CCPD ended as ordered by Nephrologist.  Pt had 4 of 5 cycles with difficulty during drain cycles and MD was aware of it.  Total UF removed was 16ml.  Report given to primary RN.       05/29/22 1300   Completing APD/CCPD (Taking the Patient OFF)    Effluent Appearance Clear;Yellow   Specimen Collected No   Peritoneal Dialysis Comments CCPD ended as ordered by Nephrologist with difficulty in drain cycles   Peritoneal Dialysis Catheter Mid lower abdomen   Placement Date/Time: 05/26/22 2000   Present on Admission?: Yes  Catheter Location: Mid lower abdomen   Site Assessment Clean;Dry;Intact

## 2022-05-29 NOTE — OT Eval Note (Signed)
[facility]   Occupational Therapy Attempt Note    Patient:  Shelby Barron MRN#:  [id number]  Unit:  [facility] 4 NORTH Room/Bed:  A4027/A4027.01      Occupational Therapy evaluation attempted on 05/29/2022 at 9:18 AM.    OT Visit Cancellation Reason: Testing/Procedure (Per RN still finishing PD, requesting therapy return at later time.)          Physical and/or occupational therapy will follow up at a later time/date as able.    RN aware.      Lennox Laity, OTR/L   05/29/2022 9:18 AM  PRN Therapist   Please see secure chat for today's working hours and spectralink phone number

## 2022-05-29 NOTE — Consults (Signed)
ID Consult       Office: [phone]    Date Time: 05/29/22 1:13 PM  Patient Name: Shelby Barron  Requesting Physician: Gayleen Orem, MD     Reason for consultation:  Suspected UTI    Problem list:  Acute  Admission with abd pain  Recently recognized bladder cancer with extension to ureter  Pyuria/ hematuria   -- recent dx of "UTI" treated with levaquin   -- prior h/o enterococcal UTI (march 2024)    -- amp sensitive    PD fluid clear  -------------------------------------  Chronic conditions  Diabetes  Hypertension  H/O CVA  ESRD, on peritoneal dialysis  Prior h/o renal cell CA on left  -- s/p partial L nephrectomy  Gout  COPD  Allergy to sulfa  -------------------------------------  Antibiotics, to date:  Cefepime 1 gram IV daily #4  -------------------------------------  Impression:  Elderly lady with bladder cancer with extension into ureters with pyuria/ hematuria. No fever but has leucocytosis.  It may be that the active urine sediment may be due to tumor but she is at risk for infection and has had recent abx, which may not make the current cx acurate.  She does have a prior h/o enterococcus in the urine.    Recommendations:  Change cefepime to zosyn, as empiric coverage with enterococcal activity.  Likely only a short course is needed and we could convert to an oral regimen if no other concerns.  ----------------------------------------------------------------------  ----------------------------------------------------------------------  History of present illness:  Patient, with past history as documented, presents with abd pain in setting of known, recently diagnosed bladder cancer.   She has ESRD, on PD and has recently re-engaged in urology care  for the new malignancy.  She has a past h/o renal cell CA, treated with partial nephrectomy on left.    By report she had a urine sample done 2 weeks ago and started on levaquin empirically. Not clear if a cx was done.  She has a past h/o an  enterococcal UTI in [place], [place] to amp.    On this admit, seen to have pyuria, started on IV abx, but cx negative.  An ID consult was requested.    Past medical history:  Past Medical History:   Diagnosis Date    Acid reflux     Asthma, well controlled     Cancer of kidney     Chronic lower back pain     COPD (chronic obstructive pulmonary disease)     CVA (cerebral infarction) Jan 12, 2003    Diabetes     Diabetic nephropathy     Fibroids     Gout     H/O: gout     Hyperlipidemia     Hypertension     Neuropathy of hand     SOB (shortness of breath)      Past Surgical History:   Procedure Laterality Date    APPENDECTOMY (OPEN)      HERNIA REPAIR      HYSTERECTOMY      PARTIAL NEPHRECTOMY      tumor removed from kidney  cJanuary 2015    TURBT         Review of systems:  Breathing ok on room air. Admits to abd pain. Does night time PD. No rash but some pruritus. No diaphoresis. No headache.      Family history:  Family History   Problem Relation Age of Onset    Cancer Mother  Heart disease Father     Cancer Maternal Aunt     Diabetes Maternal Aunt     Cancer Paternal Aunt        Social history:  Social History     Socioeconomic History    Marital status: Married     Spouse name: Not on file    Number of children: Not on file    Years of education: Not on file    Highest education level: Not on file   Occupational History    Not on file   Tobacco Use    Smoking status: Former    Smokeless tobacco: Never   Vaping Use    Vaping status: Never Used   Substance and Sexual Activity    Alcohol use: No    Drug use: No    Sexual activity: Not on file   Other Topics Concern    Not on file   Social History Narrative    Not on file     Social Determinants of Health     Financial Resource Strain: Not on file   Food Insecurity: No Food Insecurity (05/26/2022)    Hunger Vital Sign     Worried About Running Out of Food in the Last Year: Never true     Ran Out of Food in the Last Year: Never true   Transportation Needs: No  Transportation Needs (05/26/2022)    PRAPARE - Therapist, art (Medical): No     Lack of Transportation (Non-Medical): No   Physical Activity: Not on file   Stress: Not on file   Social Connections: Unknown (02/01/2022)    Received from [facility]    Social Network     Social Network: Not on file   Intimate Partner Violence: Not At Risk (05/26/2022)    Humiliation, Afraid, Rape, and Kick questionnaire     Fear of Current or Ex-Partner: No     Emotionally Abused: No     Physically Abused: No     Sexually Abused: No   Housing Stability: Unknown (05/26/2022)    Housing Stability Vital Sign     Unable to Pay for Housing in the Last Year: No     Number of Places Lived in the Last Year: Not on file     Unstable Housing in the Last Year: No       Exam:  Vitals:    05/29/22 1210   BP: 120/70   Pulse: 71   Resp: 18   Temp: 97.3 F (36.3 C)   SpO2: 96%     Awake, alert, no distress, cooperative.    HENT normal. Mouth moist, face symmetric  Lungs coarse breath sounds, without rales, wheezing  Cv normal without murmur, rub, or gallop  Abdomen soft nontender, PD cath site not inflamed  Extrem normal without phlebitis or edema.   No rash.   Neurologic exam non-focal.   Normal mental status.        Lines:  piv    Labs:    Recent Labs   Lab 05/29/22  0903   WBC 11.30*   Hgb 9.0*   Hematocrit 28.1*   Platelets 232     Recent Labs   Lab 05/28/22  0943 05/27/22  0431 05/26/22  1526   Sodium 138  More results in Results Review 138   Potassium 4.5  More results in Results Review 5.0   Chloride 98*  More results in Results Review 95*  CO2 23  More results in Results Review 25   BUN 56.0*  More results in Results Review 53.0*   Creatinine 14.8*  More results in Results Review 13.6*   Calcium 9.3  More results in Results Review 10.5*   Albumin  --   --  3.2*   Protein, Total  --   --  7.2   Bilirubin, Total  --   --  0.6   Alkaline Phosphatase  --   --  78   ALT  --   --  13   AST (SGOT)  --   --  18   Glucose 185*   More results in Results Review 98   More results in Results Review = values in this interval not displayed.          Radiographic studies:  CT scan May 29, 2022  1. Grade 3 pronounced right-sided hydronephrosis and diffuse ureterectasis  to the level of the right ureterovesical junction of uncertain etiology.  This is new from prior magnetic resonance imaging  2. Several small masses of the right kidney may represent small solid  masses or complex cysts    Attestations:  I have considered the potential drug interactions between antimicrobial agents   I have recommended and other medications required by the patient and adjusted appropriately for renal function.  I have given thought to the complex medical conditions present and have endeavored to balance the interventions required by the acute conditions with the potential toxicities of the medications and procedures on the patient's well being and on the status of the other chronic conditions. A total of  65 minutes were spent in the care of this patient.    Signed by: Guadalupe Maple, MD, MD

## 2022-05-29 NOTE — OT Eval Note (Signed)
Occupational Therapy Eval and Treatment [name]        Post Acute Care Therapy Recommendations   Discharge Recommendations:  Home with supervision, Home with home health OT, Home with home health PT    DME needs IF patient is discharging home: No additional equipment/DME recommended at this time    Therapy discharge recommendations may change with patient status.  Please refer to most recent note for up-to-date recommendations.    Unit: [facility] 4 NORTH  Bed: A4027/A4027.01      ___________________________________________________    Time of Evaluation and Treatment:  Time Calculation  OT Received On: 05/29/22  Start Time: 1340  Stop Time: 1441  Time Calculation (min): 61 min       Chart Review and Collaboration with Care Team: 6 minutes, not included in above time.    Evaluation: 12 minutes  Treatment:  49 minutes    OT Visit Number: 1    Consult received for [name] for OT Evaluation and Treatment.  Patient's medical condition is appropriate for Occupational therapy intervention at this time.    Activity Orders:  OT eval and treat    Precautions and Contraindications:  Precautions  Fall Risks: Medium    Personal Protective Equipment (PPE)  gloves and procedure mask    Medical Diagnosis:  SOB (shortness of breath) [R06.02]  Peritonitis [K65.9]    History of Present Illness:  PENNYE MACARIO is a 82 y.o. female admitted on 05/26/2022 with c/o shortness of breath, lower abdominal pain, nausea, and cough x 4 days. Had a biopsy for bladder cancer on April 1 st. Patient is on PD and was recently treated for UTI     Patient Active Problem List   Diagnosis    Diabetes mellitus type II, uncontrolled    Diabetes mellitus type II, uncontrolled    Neuropathy of hand    Left renal mass    Hamstring injury    Peritonitis        Past Medical/Surgical History:  Past Medical History:   Diagnosis Date    Acid reflux     Asthma, well controlled     Cancer of kidney     Chronic lower back pain     COPD  (chronic obstructive pulmonary disease)     CVA (cerebral infarction) Jan 12, 2003    Diabetes     Diabetic nephropathy     Fibroids     Gout     H/O: gout     Hyperlipidemia     Hypertension     Neuropathy of hand     SOB (shortness of breath)      Past Surgical History:   Procedure Laterality Date    APPENDECTOMY (OPEN)      HERNIA REPAIR      HYSTERECTOMY      PARTIAL NEPHRECTOMY      tumor removed from kidney  cJanuary 2015    TURBT          X-Rays/Tests/Labs  Lab Results   Component Value Date/Time    HGB 9.0 (L) 05/29/2022 09:03 AM    HCT 28.1 (L) 05/29/2022 09:03 AM    K 4.5 05/28/2022 09:43 AM    NA 138 05/28/2022 09:43 AM    TROPI 9.6 05/26/2022 06:37 PM    TROPI -2.2 05/26/2022 06:37 PM    TROPI 11.8 05/26/2022 03:26 PM       All imaging reviewed. Please see chart for details  Social History:  Prior Level of Function  Prior level of function: Ambulates with assistive device, Needs assistance with ADLs  Assistive Device: Four wheel walker, Single point cane  Dressing - Upper Body: independent  Dressing - Lower Body: independent    Home Living Arrangements  Living Arrangements: Children, Spouse/significant other  Type of Home: House  Home Layout: Multi-level (staying on 2nd floor in den transitioned to PD area)  Home Living - Notes / Comments: Recent re-did downstairs bathroom- to change into ADA bathroom      Subjective:  Subjective: "I don't know what's wrong but somethings wrong."   Patient is agreeable to participation in the therapy session. Nursing clears patient for therapy.   Patient Goal: Feel better  Pain Assessment  Pain Assessment:  (c/o feel of constricted breathing. O2 stable)      Objective:      Observation of Patient/Vital Signs: VSS    Cognitive Status and Neuro Exam:  Cognition/Neuro Status  Arousal/Alertness: Appropriate responses to stimuli  Attention Span: Appears intact  Orientation Level: Oriented X4  Behavior: cooperative, calm    Musculoskeletal Examination  Gross ROM  Right  Upper Extremity ROM: within functional limits  Left Upper Extremity ROM: within functional limits  Gross Strength  Right Upper Extremity Strength: 3+/5  Left Upper Extremity Strength: 3+/5       Sensory/Oculomotor Examination  Sensory  Auditory: intact  Tactile - Light Touch:  (numbness bilateral LEs)         Activities of Daily Living  Self-care and Home Management  Eating: Supervision, in bed  Grooming: Supervision  LB Dressing: Stand by Assist, Don/doff R sock, Don/doff L sock, Thread RLE into underwear, Thread LLE into underwear  Toileting: Contact Guard Assist  Functional Transfers: Contact Guard Assist    Functional Mobility:  Mobility and Transfers  Supine to Sit: Minimal Assist  Sit to Supine: Supervision  Sit to Stand: Armed forces logistics/support/administrative officer Mobility/Ambulation: Marketing executive Sitting Balance: Supervision    Participation and Activity Tolerance  Participation and Endurance  Participation Effort: good    Educated the Patient to role of occupational therapy, plan of care, goals of therapy and safety with mobility and ADLs with verbalized understanding .    Patient left in bed with alarm and all other medical equipment in place and call bell and all personal items/needs within reach.  RN notified of session outcome.        Assessment:  SCOTTI KIDDOO is a 82 y.o. female admitted 05/26/2022. OT Assessment: decreased strength;balance deficits;decreased independence with ADLs;decreased independence with IADLs;decreased endurance/activity tolerance      Treatment:  Patient received supine, family present in the room. Patient reporting difficulty breathing, VSS. RN aware. Patient requires increased encouragement, increased time for tasks. Once EOB, patient able don socks with increased time/effort. Stood with Min A initially, progressed to CGA and then supervision for subsequent transfers. Patient ambulated ~ 20 feet in room, returned to EOB and was getting back to bed  when she requested to use the toilet. Daughter assisted with lowering undergarments 2/2 to rushing but afterwards patient able to manage her own underwear change along with pericare/washing up afterward. Patient reports she was too tired to brush teeth sinkside. Offered for patient to sit in rollator seat, but she declined. Returned supine.     Daughter expressed concerns with getting patient home, reports she was working on adapting lower level for patient to  stay there, but it is not ready yet. Discussed stretcher transport to get patient up to second level while daughter finishes the lowest level. At this time, patient requiring supervision to [facility] for tasks, decreased endurance and not likely to tolerate/progress in SNF setting. Recommend home with family assist (discussed daughter speaking with CM about private aides as needed) and [facility] therapy. RN notified      Rehabilitation Potential: Prognosis: Fair, With home health nursing/aide, With family, Patient has multiple medical complication    Plan:  OT Frequency Recommended: 1-2x/wk   Treatment Interventions: ADL retraining, Functional transfer training, UE strengthening/ROM, Patient/Family training, Compensatory technique education     PMP Activity: Step 6 - Walks in Room  Distance Walked (ft) (Step 6,7): 20 Feet    Risks/benefits/POC discussed    Goals:  Time For Goal Achievement: 5 visits  ADL Goals  Patient will groom self: Supervision  Patient will dress lower body: Supervision  Patient will toilet: Supervision  Mobility and Transfer Goals  Pt will perform functional transfers: Supervision, with rolling walker                           Naya Ilagan, OTR/L   05/29/2022 3:06 PM  PRN Therapist   Please see secure chat for today's working hours and spectralink phone number        [facility]  Patient: AVENLEY RAJPUT MRN#: [id number]   Unit: [facility] 4 NORTH Bed: A4027/A4027.01

## 2022-05-30 ENCOUNTER — Inpatient Hospital Stay: Payer: No Typology Code available for payment source

## 2022-05-30 LAB — CBC AND DIFFERENTIAL
Absolute NRBC: 0 10*3/uL (ref 0.00–0.00)
Basophils Absolute Automated: 0.05 10*3/uL (ref 0.00–0.08)
Basophils Automated: 0.4 %
Eosinophils Absolute Automated: 0.01 10*3/uL (ref 0.00–0.44)
Eosinophils Automated: 0.1 %
Hematocrit: 27.5 % — ABNORMAL LOW (ref 34.7–43.7)
Hgb: 8.8 g/dL — ABNORMAL LOW (ref 11.4–14.8)
Immature Granulocytes Absolute: 0.14 10*3/uL — ABNORMAL HIGH (ref 0.00–0.07)
Immature Granulocytes: 1.2 %
Instrument Absolute Neutrophil Count: 7.41 10*3/uL — ABNORMAL HIGH (ref 1.10–6.33)
Lymphocytes Absolute Automated: 2.57 10*3/uL (ref 0.42–3.22)
Lymphocytes Automated: 22.2 %
MCH: 30.4 pg (ref 25.1–33.5)
MCHC: 32 g/dL (ref 31.5–35.8)
MCV: 95.2 fL (ref 78.0–96.0)
MPV: 9.9 fL (ref 8.9–12.5)
Monocytes Absolute Automated: 1.42 10*3/uL — ABNORMAL HIGH (ref 0.21–0.85)
Monocytes: 12.2 %
Neutrophils Absolute: 7.41 10*3/uL — ABNORMAL HIGH (ref 1.10–6.33)
Neutrophils: 63.9 %
Nucleated RBC: 0 /100 WBC (ref 0.0–0.0)
Platelets: 241 10*3/uL (ref 142–346)
RBC: 2.89 10*6/uL — ABNORMAL LOW (ref 3.90–5.10)
RDW: 15 % (ref 11–15)
WBC: 11.6 10*3/uL — ABNORMAL HIGH (ref 3.10–9.50)

## 2022-05-30 LAB — WHOLE BLOOD GLUCOSE POCT
Whole Blood Glucose POCT: 113 mg/dL — ABNORMAL HIGH (ref 70–100)
Whole Blood Glucose POCT: 156 mg/dL — ABNORMAL HIGH (ref 70–100)
Whole Blood Glucose POCT: 173 mg/dL — ABNORMAL HIGH (ref 70–100)
Whole Blood Glucose POCT: 225 mg/dL — ABNORMAL HIGH (ref 70–100)
Whole Blood Glucose POCT: 193 mg/dL — ABNORMAL HIGH (ref 70–100)

## 2022-05-30 LAB — MAGNESIUM: Magnesium: 2.3 mg/dL (ref 1.6–2.6)

## 2022-05-30 LAB — BASIC METABOLIC PANEL
Anion Gap: 17 — ABNORMAL HIGH (ref 5.0–15.0)
BUN: 50 mg/dL — ABNORMAL HIGH (ref 7.0–21.0)
CO2: 24 mEq/L (ref 17–29)
Calcium: 8.7 mg/dL (ref 7.9–10.2)
Chloride: 98 mEq/L — ABNORMAL LOW (ref 99–111)
Creatinine: 14.3 mg/dL — ABNORMAL HIGH (ref 0.4–1.0)
Glucose: 111 mg/dL — ABNORMAL HIGH (ref 70–100)
Potassium: 3.8 mEq/L (ref 3.5–5.3)
Sodium: 139 mEq/L (ref 135–145)
eGFR: 2.3 mL/min/{1.73_m2} — AB (ref >=60)

## 2022-05-30 LAB — PHOSPHORUS: Phosphorus: 6.2 mg/dL — ABNORMAL HIGH (ref 2.3–4.7)

## 2022-05-30 NOTE — Progress Notes (Signed)
[facility]  PROGRESS NOTE      Patient: Shelby Barron  Date: 05/30/2022   LOS: 4 Days  Admission Date: 05/26/2022   MRN: [id number]  Attending: Gayleen Orem, MD  When on service as the attending, please contact me on Epic Secure Chat from 7 AM - 7 PM for non-urgent issues. For urgent matters use XTend page from 7 AM - 7 PM.       ASSESSMENT/PLAN     Shelby Barron is a 82 y.o. female admitted with Peritonitis    Interval Summary: 82 y.o. 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   Patient was sent to the ER by her nephrologist for dysuria and suprapubic pain peristent despite 5d course of Levaquin.  CT showing new finding of grade 3 pronounced right -sided hydronephrosis and diffuse ureterectasis to the level of the right UVJ and several small masses of the right kidney. Urology consulted and did not recommend any acute intervention this admission for worsening hydro, just continue treatment with empiric antibiotics for UTI.         Patient Active Hospital Problem List:  Sepsis 2/2 UTI resolved   - lactic normal, leukocytosis improving   -Blood culture and urine culture remain negative  -Given patient failed outpatient Levaquin infectious disease consulted recommended to continue Zosyn over the weekend  -Pending final recommendation regarding duration of treatment from ID discussed with Dr. Tyler Barron     High grade urothelioma bladder CA   Obstructive uropathy with grade 3 hydronephrosis  RCC s/p partial nephrectomy    - Followed by Dr Shelby Barron outpatient with plans for cystectomy and nephrectomy   - Urology consulted, since no pyelo on imaging likely nothing to do for worsening hyrdo this admission   - CTAP with grade 3 R sided hydronephrosis and ureterectasis to the R UVJ.  Several small masses of the R kidney which may be solid masses vs complex cysts.  - Pain control as ordered. Pt does not feel her pain is well controlled today, required IV pain medication today 5/10    Malfunctioning peritoneal dialysis portal  At this point peritoneal dialysis fairly working per nephrology and recommend to continue  Ordered abdominal x-ray to see if it is kinked if not possible manipulation by IR as an outpatient.  Nephrology is planning IR to evaluate peritoneal dialysis tube on Monday    COPD   - stable, chest x-ray unremarkable   - resume albuterol PRN   - resume anoro ellipta & singulair per home dose     HTN  HLD  H/o CVA  Continue metoprolol/atorvastatin     ESRD on PD  HAGMA 2/2 ESRD   - nephrology consulted, continue PD as ordered   - resume nephrovite, sensipar, renvela   - hold torsemide & aldactone given soft Bps on admission   - trend BMP     DMII   - continue reduced dose lantus 10 U daily   - low dose SSI   - carb consistent diet   - A1C 8.5     GERD   - Continue PPI     Gout   - Continue allopurinol     Iron deficient anemia  Anemia of chronic disease   -  resume iron supplement   - follow h/h     - hold ESA given ca        Nutrition: renal diet          Patient has BMI=Body mass index is 33.91 kg/m.  Diagnosis: Obesity based on BMI criteria               Recent Labs   Lab 05/30/22  0453 05/29/22  0903 05/28/22  0943   Hgb 8.8* 9.0* 9.5*   Hematocrit 27.5* 28.1* 29.4*   MCV 95.2 94.9 95.5   WBC 11.60* 11.30* 12.03*   Platelets 241 232 251         Anemia Diagnosis: Chronic: Anemia of Chronic Kidney Disease    Code Status: Full Code    Dispo: pending    Family Contact: Daughter at the bedside    DVT Prophylaxis:   Current Facility-Administered Medications (Includes Only Anticoagulants, Misc. Hematological)   Medication Dose Route Last Admin    clopidogrel (PLAVIX) tablet 75 mg  75 mg Oral 75 mg at 05/30/22 0857    heparin (porcine) injection 5,000 Units  5,000 Units Subcutaneous 5,000  Units at 05/30/22 1344          CHART  REVIEW & DISCUSSION     The following chart items were reviewed as of 3:49 PM on 05/30/22:  [x]  Lab Results [x]  Imaging Results   []  Problem List  [x]  Current Orders [x]  Current Medications  []  Allergies  [x]  Code Status [x]  Previous Notes   []  SDoH    The management and plan of care for this patient was discussed with the following specialty consultants:  []  Cardiology  []  Gastroenterology                 [x]  Infectious Disease  []  Pulmonology []  Neurology                [x]  Nephrology  []  Neurosurgery []  Orthopedic Surgery  []  Heme/Onc  []  General Surgery []  Psychiatry                                   [x]  Urology    SUBJECTIVE     Shelby Barron   Patient still feels uncomfortable and short of breath when she lies flat likely from peritoneal fluid  Chest x-rays unremarkable  Denies chest pain, fever, chills, abdominal pain or diarrhea  MEDICATIONS     Current Facility-Administered Medications   Medication Dose Route Frequency    allopurinol  100 mg Oral Daily    atorvastatin  40 mg Oral Daily    b complex-vitamin c-folic acid  1 tablet Oral Daily    cinacalcet  30 mg Oral Daily    clopidogrel  75 mg Oral Daily    heparin (porcine)  5,000 Units Subcutaneous Q8H SCH    insulin glargine  10 Units Subcutaneous QAM    insulin lispro  1-3 Units Subcutaneous QHS    insulin lispro  1-5 Units Subcutaneous TID AC    iron polysaccharides  150 mg Oral Daily    metoprolol tartrate  12.5 mg Oral Daily    montelukast  10 mg Oral QHS    oxybutynin XL  5 mg Oral Daily    pantoprazole  40 mg Oral Q12H    piperacillin-tazobactam  2.25 g Intravenous Q8H    prochlorperazine  5 mg Intravenous Once  senna-docusate  2 tablet Oral QHS    sevelamer carbonate  1,600 mg Oral BID AC    umeclidinium-vilanterol  1 puff Inhalation QAM       PHYSICAL EXAM     Vitals:    05/30/22 1532   BP: 103/53   Pulse: 81   Resp: 15   Temp: 98.2 F (36.8 C)   SpO2: 96%       Temperature: Temp  Min: 97.3 F (36.3 C)   Max: 98.4 F (36.9 C)  Pulse: Pulse  Min: 68  Max: 84  Respiratory: Resp  Min: 15  Max: 18  Non-Invasive BP: BP  Min: 103/53  Max: 144/61  Pulse Oximetry SpO2  Min: 93 %  Max: 100 %    Intake and Output Summary (Last 24 hours) at Date Time    Intake/Output Summary (Last 24 hours) at 05/30/2022 1549  Last data filed at 05/30/2022 1100  Gross per 24 hour   Intake 150 ml   Output --   Net 150 ml           GEN APPEARANCE: Normal;  A&OX3  HEENT: PERLA; EOMI; Conjunctiva Clear  NECK: Supple; No bruits  CVS: RRR, S1, S2; No M/G/R  LUNGS: CTAB; No Wheezes; No Rhonchi: No rales  ABD: Soft; No TTP; + Normoactive BS  EXT: No edema; Pulses 2+ and intact  NEURO: CN 2-12 intact; No Focal neurological deficits  MENTAL STATUS: Conversant and cooperative    LABS     Recent Labs   Lab 05/30/22  0453 05/29/22  0903 05/28/22  0943   WBC 11.60* 11.30* 12.03*   RBC 2.89* 2.96* 3.08*   Hgb 8.8* 9.0* 9.5*   Hematocrit 27.5* 28.1* 29.4*   MCV 95.2 94.9 95.5   Platelets 241 232 251       Recent Labs   Lab 05/30/22  0453 05/28/22  0943 05/27/22  0431 05/26/22  1526   Sodium 139 138 138 138   Potassium 3.8 4.5 4.3 5.0   Chloride 98* 98* 100 95*   CO2 24 23 23 25    BUN 50.0* 56.0* 57.0* 53.0*   Creatinine 14.3* 14.8* 14.4* 13.6*   Glucose 111* 185* 167* 98   Calcium 8.7 9.3 9.5 10.5*   Magnesium 2.3  --  2.5  --        Recent Labs   Lab 05/26/22  1526   ALT 13   AST (SGOT) 18   Bilirubin, Total 0.6   Albumin 3.2*   Alkaline Phosphatase 78       Recent Labs   Lab 05/26/22  1837 05/26/22  1526   hs Troponin-I 9.6 11.8   hs Troponin-I Delta -2.2  --              Microbiology Results (last 15 days)       Procedure Component Value Units Date/Time    Culture + Gram Azzie Glatter Body Fluid [[id number]] Collected: 05/27/22 1321    Order Status: Completed Specimen: Body Fluid from Peritoneal Fluid (Abdominal) Updated: 05/30/22 1501    Narrative:      ORDER#: [id number]                                    ORDERED BY: Georgiana Shore  SOURCE: Peritoneal  Fluid (Abdominal) peritoneal fluidCOLLECTED:  05/27/22 13:21  ANTIBIOTICS AT COLL.:  RECEIVED :  05/27/22 16:33  Stain, Gram                                FINAL       05/27/22 18:12  05/27/22   Few WBCs             No organisms seen             Stain performed on Cytospin (concentrated) specimen  Culture and Gram Stain, Aerobic, Body FluidPRELIM      05/30/22 15:01  05/28/22   Culture no growth to date. Final report to follow  05/29/22   Culture no growth to date. Final report to follow  05/30/22   Culture no growth to date. Final report to follow      Urine culture [[id number]] Collected: 05/26/22 2234    Order Status: Completed Specimen: Bladder Updated: 05/28/22 1742    Narrative:      ORDER#: [id number]                                    ORDERED BY: Leigh Aurora, JODY  SOURCE: Urine                                        COLLECTED:  05/26/22 22:34  ANTIBIOTICS AT COLL.:                                RECEIVED :  05/27/22 14:13  Culture Urine                              FINAL       05/28/22 17:42  05/28/22   10,000 - 30,000 CFU/MLof normal urogenital or skin microbiota, no             further work      Culture + Gram Stain,Aerobic, Body Fluid [[id number]]     Order Status: Sent Specimen: Body Fluid from Peritoneal Fluid (Abdominal)     Culture, Anaerobic Bacteria [[id number]]     Order Status: Sent Specimen: Other from Peritoneal Fluid (Abdominal)     Culture Blood Aerobic and Anaerobic [[id number]] Collected: 05/26/22 1604    Order Status: Completed Specimen: Arm from Blood, Venipuncture Updated: 05/29/22 2021    Narrative:      ORDER#: [id number]                                    ORDERED BY: Caroline More  SOURCE: Blood, Venipuncture Arm                      COLLECTED:  05/26/22 16:04  ANTIBIOTICS AT COLL.:                                RECEIVED :  05/26/22 19:56  Culture Blood Aerobic and Anaerobic        PRELIM      05/29/22 20:21  05/27/22   No Growth after 1 day/s of  incubation.  05/28/22   No  Growth after 2 day/s of incubation.  05/29/22   No Growth after 3 day/s of incubation.      COVID-19 (SARS-CoV-2) and Influenza A/B, NAA (Liat Rapid) [[id number]] Collected: 05/26/22 1535    Order Status: Completed Specimen: Culturette from Nasopharyngeal Updated: 05/26/22 1601     Purpose of COVID testing Diagnostic -PUI     SARS-CoV-2 Specimen Source Nasal Swab     SARS CoV 2 Overall Result Not Detected     Comment: __________________________________________________  -A result of "Detected" indicates POSITIVE for the    presence of SARS CoV-2 RNA  -A result of "Not Detected" indicates NEGATIVE for the    presence of SARS CoV-2 RNA  __________________________________________________________  Test performed using the Roche cobas Liat SARS-CoV-2 assay. This assay is  only for use under the Food and Drug Administration's Emergency Use  Authorization. This is a real-time RT-PCR assay for the qualitative  detection of SARS-CoV-2 RNA. Viral nucleic acids may persist in vivo,  independent of viability. Detection of viral nucleic acid does not imply the  presence of infectious virus, or that virus nucleic acid is the cause of  clinical symptoms. Negative results do not preclude SARS-CoV-2 infection and  should not be used as the sole basis for diagnosis, treatment or other  patient management decisions. Negative results must be combined with  clinical observations, patient history, and/or epidemiological information.  Invalid results may be due to inhibiting substances in the specimen and  recollection should occur. Please see Fact Sheets for patients and providers  located:  WirelessDSLBlog.no          Influenza A Not Detected     Influenza B Not Detected     Comment: Test performed using the Roche cobas Liat SARS-CoV-2 & Influenza A/B assay.  This assay is only for use under the Food and Drug Administration's  Emergency Use Authorization. This is a multiplex real-time  RT-PCR assay  intended for the simultaneous in vitro qualitative detection and  differentiation of SARS-CoV-2, influenza A, and influenza B virus RNA. Viral  nucleic acids may persist in vivo, independent of viability. Detection of  viral nucleic acid does not imply the presence of infectious virus, or that  virus nucleic acid is the cause of clinical symptoms. Negative results do  not preclude SARS-CoV-2, influenza A, and/or influenza B infection and  should not be used as the sole basis for diagnosis, treatment or other  patient management decisions. Negative results must be combined with  clinical observations, patient history, and/or epidemiological information.  Invalid results may be due to inhibiting substances in the specimen and  recollection should occur. Please see Fact Sheets for patients and providers  located: http://www.rice.biz/.         Narrative:      o Collect and clearly label specimen type:  o PREFERRED-Upper respiratory specimen: One Nasal Swab in  Transport Media.  o Hand deliver to laboratory ASAP  Diagnostic -PUI    Culture Blood Aerobic and Anaerobic [[id number]] Collected: 05/26/22 1526    Order Status: Completed Specimen: Arm from Blood, Venipuncture Updated: 05/29/22 2021    Narrative:      ORDER#: [id number]                                    ORDERED BY: Caroline More  SOURCE: Blood, Venipuncture Arm  COLLECTED:  06-15-2022 15:26  ANTIBIOTICS AT COLL.:                                RECEIVED :  2022-06-15 19:56  Culture Blood Aerobic and Anaerobic        PRELIM      05/29/22 20:21  05/27/22   No Growth after 1 day/s of incubation.  05/28/22   No Growth after 2 day/s of incubation.  05/29/22   No Growth after 3 day/s of incubation.      Culture Blood Aerobic and Anaerobic [[id number]]     Order Status: Canceled Specimen: Arm from Blood, Venipuncture              RADIOLOGY     XR Chest AP Portable    Result Date: 05/30/2022  No acute cardiopulmonary  disease. No change from XR CHEST AP ONLY with report dated 06-15-2022 4:03 PM. [name], MD 05/30/2022 1:12 PM    XR ABDOMEN 2 VIEW    Result Date: 05/29/2022  No evidence of kinking of the peritoneal dialysis catheter in the lateral left abdomen/pelvis. Carlynn Spry, MD 05/29/2022 5:44 PM    CT Chest Abdomen Pelvis WO Contrast    Result Date: 2022/06/15  1. Grade 3 pronounced right-sided hydronephrosis and diffuse ureterectasis to the level of the right ureterovesical junction of uncertain etiology. This is new from prior magnetic resonance imaging 2. Several small masses of the right kidney may represent small solid masses or complex cysts. These could be further evaluated by renal magnetic resonance imaging when patient is clinically stable [name], MD 06/15/2022 4:47 PM    Chest AP Only    Result Date: 06-15-22  No acute cardiopulmonary disease. Fonnie Mu, DO 06/15/22 4:03 PM   Echo Results       None          No results found for this or any previous visit.    Signed,  Shelby Orem, MD  3:49 PM 05/30/2022

## 2022-05-30 NOTE — Progress Notes (Signed)
[facility] PROGRESS NOTE  [id number]      Date Time: 05/30/22 12:19 PM  Patient Name: Shelby Barron  Attending Physician: Gayleen Orem, MD    CC: follow-up ESRD    Assessment:     ESRD - cont CCPD order though slow drain limiting now (only finished cycle b/c allowed to run 16 hrs).  Not consitpated.  AXR shows no kink - will ask VIR to try wire manipulation to get PD catheter into lower pelvis and clear track/lumen in case clot/fibrin present (no fibrin in effluent).  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 for wire manipulation of PD catheter.  If still with slow drain then, prob need Surg eval though as it is still working (though slowly) this could be done as outpt likely  Cont renvela  Abx per ID       Case discussed with: pt and IM      Debbe Mounts, MD  [facility]  [id number] (office)      Subjective:   Some distention feeling in abd but o/w no c/o    Review of Systems:   No cp      Physical Exam:     Vitals:    05/30/22 0720 05/30/22 0857 05/30/22 1005 05/30/22 1106   BP: 144/61 144/61 111/65 120/58   Pulse: 84 84 70 71   Resp: 18   15   Temp: 97.7 F (36.5 C)   97.3 F (36.3 C)   TempSrc: Oral   Oral   SpO2: 96%  98% 96%   Weight:       Height:           Intake and Output Summary (Last 24 hours) at Date Time    Intake/Output Summary (Last 24 hours) at 05/30/2022 1219  Last data filed at 05/30/2022 1100  Gross per 24 hour   Intake 390 ml   Output --   Net 390 ml       General: awake, alert, no acute distress  Cardiovascular: regular rate and rhythm, no murmurs, rubs or gallops  Lungs: clear to auscultation bilaterally, without wheezing, rhonchi, or rales  Abdomen: soft, non-tender, slightly  distended, normoactive bowel sounds  Extremities: no edema      Meds:      Scheduled Meds: PRN Meds:    allopurinol, 100 mg, Oral, Daily  atorvastatin, 40 mg, Oral, Daily  b complex-vitamin c-folic acid, 1 tablet, Oral, Daily  cinacalcet, 30 mg, Oral, Daily  clopidogrel, 75 mg, Oral, Daily  heparin (porcine), 5,000 Units, Subcutaneous, Q8H SCH  insulin glargine, 10 Units, Subcutaneous, QAM  insulin lispro, 1-3 Units, Subcutaneous, QHS  insulin lispro, 1-5 Units, Subcutaneous, TID AC  iron polysaccharides, 150 mg, Oral, Daily  metoprolol tartrate, 12.5 mg, Oral, Daily  montelukast, 10 mg, Oral, QHS  oxybutynin XL, 5 mg, Oral, Daily  pantoprazole, 40 mg, Oral, Q12H  piperacillin-tazobactam, 2.25 g, Intravenous, Q8H  prochlorperazine, 5 mg, Intravenous, Once  senna-docusate, 2 tablet, Oral, QHS  sevelamer carbonate, 1,600 mg, Oral, BID AC  umeclidinium-vilanterol, 1 puff, Inhalation, QAM          Continuous Infusions:  dianeal low calcium (2.5 mEq/L) 1.5% dextrose      dianeal low calcium (2.5 mEq/L) 1.5% dextrose      dianeal low calcium (2.5 mEq/L) 1.5% dextrose      acetaminophen, 650 mg, Q6H PRN  benzocaine-menthol, 1 lozenge, Q2H PRN  benzonatate, 100 mg, TID PRN  carboxymethylcellulose sodium, 1 drop, TID PRN  dextrose, 15 g of glucose, PRN   Or  dextrose, 12.5 g, PRN   Or  dextrose, 12.5 g, PRN   Or  glucagon (rDNA), 1 mg, PRN  diphenhydrAMINE, 25 mg, QHS PRN  HYDROmorphone, 0.2 mg, Q4H PRN  melatonin, 3 mg, QHS PRN  naloxone, 0.2 mg, PRN  ondansetron, 4 mg, Q6H PRN   Or  ondansetron, 4 mg, Q6H PRN  oxyCODONE, 5 mg, Q6H PRN  polyethylene glycol, 17 g, Daily PRN  saline, 2 spray, Q4H PRN              Labs:     Recent Labs   Lab 05/30/22  0453 05/29/22  0903 05/28/22  0943   WBC 11.60* 11.30* 12.03*   Hgb 8.8* 9.0* 9.5*   Hematocrit 27.5* 28.1* 29.4*   Platelets 241 232 251     Recent Labs   Lab 05/30/22  0453 05/28/22  0943 05/27/22  0431 05/26/22  1526   Sodium 139 138 138 138   Potassium 3.8 4.5 4.3 5.0    Chloride 98* 98* 100 95*   CO2 24 23 23 25    BUN 50.0* 56.0* 57.0* 53.0*   Creatinine 14.3* 14.8* 14.4* 13.6*   Calcium 8.7 9.3 9.5 10.5*   Albumin  --   --   --  3.2*   Phosphorus 6.2*  --  6.7*  --    Magnesium 2.3  --  2.5  --    Glucose 111* 185* 167* 98   eGFR 2.3* 2.2* 2.3* 2.4*       Recent Labs   Lab 05/26/22  2234   Urine Type Urine, Catheriz   Color, UA Orange*   Clarity, UA Turbid*   Specific Gravity UA 1.020   Urine pH 6.5   Nitrite, UA Negative   Ketones UA Negative   Urobilinogen, UA Normal   Bilirubin, UA Negative   Blood, UA Moderate*   RBC, UA TNTC*   WBC, UA TNTC*           Imaging personally reviewed, including: XR ABDOMEN 2 VIEW    Result Date: 05/29/2022  No evidence of kinking of the peritoneal dialysis catheter in the lateral left abdomen/pelvis. Carlynn Spry, MD 05/29/2022 5:44 PM         Signed by: Debbe Mounts, MD

## 2022-05-30 NOTE — Progress Notes (Signed)
CCPD completed, treatment got extended to 16 hr 30 min due to ongoing slow drain issues. Smart drain option used to complete treatment. -273 ml total UF. Report given to Alexus RN.     05/30/22 1100   Completing APD/CCPD (Taking the Patient OFF)    Effluent Appearance Clear;Yellow   Specimen Collected No   Peritoneal Dialysis Comments CCPD completed with smart drain cycle

## 2022-05-30 NOTE — PT Eval Note (Signed)
Physical Therapy Eval and Treatment  [name]      Post Acute Care Therapy Recommendations   Discharge Recommendations:  Home with supervision, Home with home health PT    If recommended discharge disposition is not available,      DME needs IF patient is discharging home: Front wheel walker    Therapy discharge recommendations may change with patient status.  Please refer to most recent note for up-to-date recommendations.    Unit: [facility] 4 NORTH  Bed: A4027/A4027.01    ___________________________________________________    Time of Evaluation and Treatment:  Time Calculation   PT Received On: 05/30/22  Start Time: 1218  Stop Time: 1305  Time Calculation (min): 47 min       Evaluation Time: 12 minutes  Treatment Time: 34 minutes    Chart Review and Collaboration with Care Team: 8 minutes, not included in above time    PT Visit Number: 1    Consult received for [name] for PT Evaluation and Treatment.  Patient's medical condition is appropriate for Physical therapy intervention at this time.    Activity Orders:  PT eval and treat    Precautions and Contraindications:  Precautions  Weight Bearing Status: no restrictions  Fall Risks: Medium  Other Precautions: fall precautions    Personal Protective Equipment (PPE)  gloves and procedure mask    Medical Diagnosis:  SOB (shortness of breath) [R06.02]  Peritonitis [K65.9]    History of Present Illness:  LILYIAN SENECHAL is a 82 y.o. female admitted on 05/26/2022 with c/o shortness of breath, lower abdominal pain, nausea, and cough x 4 days. Had a biopsy for bladder cancer on April 1 st. Patient is on PD and was recently treated for UTI     Patient Active Problem List   Diagnosis    Diabetes mellitus type II, uncontrolled    Diabetes mellitus type II, uncontrolled    Neuropathy of hand    Left renal mass    Hamstring injury    Peritonitis       Past Medical/Surgical History:  Past Medical History:   Diagnosis Date    Acid reflux     Asthma,  well controlled     Cancer of kidney     Chronic lower back pain     COPD (chronic obstructive pulmonary disease)     CVA (cerebral infarction) Jan 12, 2003    Diabetes     Diabetic nephropathy     Fibroids     Gout     H/O: gout     Hyperlipidemia     Hypertension     Neuropathy of hand     SOB (shortness of breath)      Past Surgical History:   Procedure Laterality Date    APPENDECTOMY (OPEN)      HERNIA REPAIR      HYSTERECTOMY      PARTIAL NEPHRECTOMY      tumor removed from kidney  cJanuary 2015    TURBT         X-Rays/Tests/Labs:  Lab Results   Component Value Date/Time    HGB 8.8 (L) 05/30/2022 04:53 AM    HCT 27.5 (L) 05/30/2022 04:53 AM    K 3.8 05/30/2022 04:53 AM    NA 139 05/30/2022 04:53 AM    INR 1.1 11/20/2012 02:40 PM    TROPI 9.6 05/26/2022 06:37 PM    TROPI -2.2 05/26/2022 06:37 PM    TROPI 11.8  05/26/2022 03:26 PM       All imaging reviewed, please see chart for details.    Social History:  Prior Level of Function  Prior level of function: Ambulates with assistive device, Needs assistance with ADLs  Assistive Device: Four wheel walker, Single point cane  Baseline Activity Level: Household ambulation  Ambulated 100 feet or more prior to admission: Yes  Cooking: light meal prep  Employment: Retired    Development worker, international aid  Living Arrangements: Children, Spouse/significant other  Type of Home: House  Home Layout: Multi-level  Home Living - Notes / Comments: Recent re-did downstairs bathroom- to change into ADA bathroom      Subjective:  Patient is agreeable to participation in the therapy session. Nursing clears patient for therapy.  Patient Goal: return home with help as needed  Pain Assessment  Pain Assessment: No/denies pain      Objective:  Observation of Patient/Vital Signs:  VSS    Inspection/Posture: sitting up in bed    Cognitive Status and Neuro Exam:  Cognition/Neuro Status  Arousal/Alertness: Appropriate responses to stimuli  Attention Span: Appears intact  Orientation Level: Oriented  X4  Following Commands: Follows all commands and directions without difficulty  Safety Awareness: independent  Insights: Fully aware of deficits  Behavior: calm;cooperative  Motor Planning: decreased processing speed  Coordination: intact    Musculoskeletal Examination  Gross ROM  Neck/Trunk ROM: within functional limits  Right Lower Extremity ROM: within functional limits  Left Lower Extremity ROM: within functional limits  Gross Strength  Right Lower Extremity Strength: within functional limits  Left Lower Extremity Strength: within functional limits       Functional Mobility:  Functional Mobility  Supine to Sit: Supervision  Scooting to EOB: Contact Guard Assist  Sit to Supine: Stand by Assist  Sit to Stand: Risk analyst  Stand to Sit: Contact Guard Assist  Transfers  Bed to Chair: Research scientist (physical sciences)  Ambulation: Contact Guard Assist  Pattern: Wide BOS;decreased step length;decreased cadence;R steppage  Stair Management: not attempted     Balance  Balance  Balance: needs focused assessment  Sitting - Static: Good  Sitting - Dynamic: Good  Standing - Static: Fair  Standing - Dynamic: Fair    Participation and Activity Tolerance  Participation and Endurance  Participation Effort: good  Endurance: Tolerates 10 - 20 min exercise with multiple rests    Educated the Patient to role of physical therapy, plan of care, goals of therapy and safety with mobility and ADLs, home safety with verbalized understanding .    Patient left in bedside chair with alarm and all other medical equipment in place and call bell and all personal items/needs within reach.  RN notified of session outcome.        Assessment:  JADORE PARSELLS is a 82 y.o. female admitted 05/26/2022.  PT Assessment  Assessment: Decreased endurance/activity tolerance;Decreased functional mobility;Decreased balance;Gait impairment  Prognosis: Good;With continued PT status post acute discharge  Progress: Progressing toward  goals      Treatment:  Patient received sitting up in bed. Pt without family present at time of visit but husband on phone. Pt reports sitting up in chair earlier but started feeling dizzy and returned to bed. Pt also reports SOB when lying down flat. Pt performed scooting to EOB with cga for safety. Pt performed sit to stand from bed with vcs for proper hand placement. Pt performed gait training 30 ft with RW with cga for safety. Pt noted  with fair tolerance although increased fatigue and SOB. Pt noted with level 2 dyspnea after ambulation. Pt returned to EOB for rest breaks and vcs for deep breathing with pursed lips. Pt gait trained again 60 ft with RW with cga for safety. Pt returned to bed side chair with all needs in reach. Therapist discussed need for medical transport for getting to upper level of house. Pt also educated on seated bumping to get up steps to preserve energy. Pt appropriate for [place] to home with family assistance with HHPT with RW.       Plan:  Treatment/Interventions: Investment banker, operational, Stair training, Neuromuscular re-education, Functional transfer training, LE strengthening/ROM, Endurance training, Cognitive reorientation, Patient/family training, Equipment eval/education, Bed mobility, Compensatory technique education, Continued evaluation  PT Frequency: 2-3x/wk  Risks/Benefits/POC Discussed with Pt/Family: With patient    PMP Activity: Step 6 - Walks in Room  Distance Walked (ft) (Step 6,7): 60 Feet      Goals:  Goals  Goal Formulation: With patient  Time for Goal Acheivement: 2 visits  Goals: Select goal  Pt Will Go Supine To Sit: with supervision, to maximize functional mobility and independence  Pt Will Perform Sit to Stand: modified independent, to maximize functional mobility and independence  Pt Will Transfer Bed/Chair: with rolling walker, to maximize functional mobility and independence  Pt Will Ambulate: 151-200 feet, with rolling walker, to maximize functional mobility and  independence  Pt Will Go Up / Down Stairs: 6-10 stairs, with supervision, to maximize functional mobility and independence  Pt Will Perform Home Exer Program: with supervision, to maximize functional mobility and independence    Madelon Lips, PT, DPT   Physical Therapist  License # [id number]  [id number]      [facility]  Patient: RUBEN MANGRAM MRN#: [id number]  Unit: [facility] 4 NORTH Bed: A4027/A4027.01

## 2022-05-30 NOTE — Progress Notes (Signed)
CCPD started to continue overnight. No initial drain, Fill phase have no issues. PD exit site open to air, no signs of any complication.      05/30/22 1800   Initiating APD (Putting the Patient ON)    $APD Start Time 1815   CCPD Total Treatment Time Ordered (hours) 10 hours   Number of Cycles/Exchanges Ordered 5   Total Fill Volume (mL) 10000 mL   Fluid Orders for APD/CCPD   Cycler Bag #1 1.5%, Low Calcium in 5000 mL;1.5%, 3.5 Calcium in 5000 mL   Cycler Bag #2 1.5%, Low Calcium in 5000 mL   Completing APD/CCPD (Taking the Patient OFF)    Peritoneal Dialysis Comments CCPD started   Peritoneal Dialysis Catheter Mid lower abdomen   Placement Date/Time: 05/26/22 2000   Present on Admission?: Yes  Catheter Location: Mid lower abdomen   Site Assessment Clean;Dry;Intact   Status Accessed   Site Condition No complications   Dressing Open to air (None)

## 2022-05-30 NOTE — Plan of Care (Signed)
Problem: Patient Receiving Advanced Renal Therapies  Goal: Therapy access site remains intact  Outcome: Progressing  Flowsheets (Taken 05/30/2022 1834)  Therapy access site remains intact:   Assess therapy access site   Change therapy access site dressing as needed

## 2022-05-30 NOTE — Plan of Care (Signed)
NURSING SHIFT NOTE     Patient: Shelby Barron  Day: 4      SHIFT EVENTS     Shift Narrative/Significant Events (PRN med administration, fall, RRT, etc.):     Assumed pt responsibility, axo x4, on room air, pt VS stable, pt stable, no pain reported by pt, scheduled medications given as ordered, comfort measures provided to pt, pt NPO at midnight. No other changes to report on pts condition.   Pt complained of SOB with no change in SPO2, Nebulizer treatments ordered for pt.   Safety and fall precautions remain in place. Purposeful rounding completed.          ASSESSMENT     Changes in assessment from patient's baseline this shift:    Neuro: No  CV: No  Pulm: No  Peripheral Vascular: No  HEENT: No  GI: No  BM during shift: No, Last BM: Last BM Date: 05/29/22  GU: No   Integ: No  MS: No    Pain: None  Pain Interventions: None  Medications Utilized:     Mobility: PMP Activity: Step 6 - Walks in Room of Distance Walked (ft) (Step 6,7): 60 Feet           Lines     Patient Lines/Drains/Airways Status       Active Lines, Drains and Airways       Name Placement date Placement time Site Days    Peripheral IV 05/29/22 20 G Right Antecubital 05/29/22  1620  Antecubital  1    Peritoneal Dialysis Catheter Mid lower abdomen 05/26/22  2000  -- 4                         VITAL SIGNS     Vitals:    05/30/22 2020   BP: 118/67   Pulse: 81   Resp: 18   Temp: 97.3 F (36.3 C)   SpO2: 94%       Temp  Min: 97.3 F (36.3 C)  Max: 98.4 F (36.9 C)  Pulse  Min: 70  Max: 84  Resp  Min: 15  Max: 18  BP  Min: 103/53  Max: 144/61  SpO2  Min: 93 %  Max: 98 %      Intake/Output Summary (Last 24 hours) at 05/30/2022 2035  Last data filed at 05/30/2022 1532  Gross per 24 hour   Intake 370 ml   Output --   Net 370 ml            Patient self-proning?: No:      Patient using incentive spirometer?: No:    Amount achieved on incentive spirometer?: No data recorded      CARE PLAN        Problem: Pain interferes with ability to perform ADL  Goal: Pain at  adequate level as identified by patient  Outcome: Progressing  Flowsheets (Taken 05/29/2022 0800 by Viann Shove, RN)  Pain at adequate level as identified by patient: Identify patient comfort function goal     Problem: Side Effects from Pain Analgesia  Goal: Patient will experience minimal side effects of analgesic therapy  Outcome: Progressing  Flowsheets (Taken 05/29/2022 0800 by Viann Shove, RN)  Patient will experience minimal side effects of analgesic therapy: Monitor/assess patient's respiratory status (RR depth, effort, breath sounds)     Problem: Moderate/High Fall Risk Score >5  Goal: Patient will remain free of falls  Outcome: Progressing  Flowsheets (Taken 05/30/2022 1100  by Alessandra Bevels, RN)  Moderate Risk (6-13):   MOD-Consider activation of bed alarm if appropriate   MOD-Apply bed exit alarm if patient is confused   MOD-Floor mat at bedside (where available) if appropriate   MOD-Consider a move closer to Nurses Station   MOD-Remain with patient during toileting   MOD-Place bedside commode and assistive devices out of sight when not in use   MOD-Re-orient confused patients   MOD-Utilize diversion activities   MOD-Perform dangle, stand, walk (DSW) prior to mobilization   MOD-Request PT/OT consult order for patients with gait/mobility impairment     Problem: Patient Receiving Advanced Renal Therapies  Goal: Therapy access site remains intact  Outcome: Progressing  Flowsheets (Taken 05/30/2022 1834 by Silverio Lay, RN)  Therapy access site remains intact:   Assess therapy access site   Change therapy access site dressing as needed     Problem: Compromised Activity/Mobility  Goal: Activity/Mobility Interventions  Outcome: Progressing  Flowsheets (Taken 05/30/2022 0800 by Jerry Caras, Alexus, RN)  Activity/Mobility Interventions: Pad bony prominences, TAP Seated positioning system when OOB, Promote PMP, Reposition q 2 hrs / turn clock, Offload heels     Problem: Fluid and Electrolyte Imbalance/  Endocrine  Goal: Fluid and electrolyte balance are achieved/maintained  Outcome: Progressing  Flowsheets (Taken 05/28/2022 1733 by Corrie Mckusick, RN)  Fluid and electrolyte balance are achieved/maintained:   Monitor/assess lab values and report abnormal values   Assess and reassess fluid and electrolyte status   Monitor for muscle weakness  Goal: Adequate hydration  Outcome: Progressing  Flowsheets (Taken 05/28/2022 1733 by Corrie Mckusick, RN)  Adequate hydration:   Assess mucus membranes, skin color, turgor, perfusion and presence of edema   Assess for peripheral, sacral, periorbital and abdominal edema   Monitor and assess vital signs and perfusion     Problem: Compromised Sensory Perception  Goal: Sensory Perception Interventions  Outcome: Progressing  Flowsheets (Taken 05/30/2022 0800 by Jerry Caras, Alexus, RN)  Sensory Perception Interventions: Offload heels, Pad bony prominences, Reposition q 2hrs/turn Clock, Q2 hour skin assessment under devices if present     Problem: Compromised Moisture  Goal: Moisture level Interventions  Outcome: Progressing  Flowsheets (Taken 05/30/2022 0800 by Jerry Caras, Alexus, RN)  Moisture level Interventions: Moisture wicking products, Moisture barrier cream     Problem: Compromised Friction/Shear  Goal: Friction and Shear Interventions  Outcome: Progressing  Flowsheets (Taken 05/30/2022 0800 by Jerry Caras, Alexus, RN)  Friction and Shear Interventions: Pad bony prominences, Off load heels, HOB 30 degrees or less unless contraindicated, Consider: TAP seated positioning, Heel foams

## 2022-05-31 ENCOUNTER — Encounter
Admission: EM | Disposition: A | Discharge status: Home Health | Payer: Self-pay | Source: Home / Self Care | Attending: Pediatrics

## 2022-05-31 ENCOUNTER — Encounter: Payer: Self-pay | Admitting: Internal Medicine

## 2022-05-31 ENCOUNTER — Encounter (INDEPENDENT_AMBULATORY_CARE_PROVIDER_SITE_OTHER): Payer: Self-pay

## 2022-05-31 HISTORY — PX: DRAIN (OTHER): IMG2568

## 2022-05-31 LAB — CBC AND DIFFERENTIAL
Absolute NRBC: 0 10*3/uL (ref 0.00–0.00)
Basophils Absolute Automated: 0.07 10*3/uL (ref 0.00–0.08)
Basophils Automated: 0.6 %
Eosinophils Absolute Automated: 0.01 10*3/uL (ref 0.00–0.44)
Eosinophils Automated: 0.1 %
Hematocrit: 29.6 % — ABNORMAL LOW (ref 34.7–43.7)
Hgb: 9.4 g/dL — ABNORMAL LOW (ref 11.4–14.8)
Immature Granulocytes Absolute: 0.16 10*3/uL — ABNORMAL HIGH (ref 0.00–0.07)
Immature Granulocytes: 1.3 %
Instrument Absolute Neutrophil Count: 7.52 10*3/uL — ABNORMAL HIGH (ref 1.10–6.33)
Lymphocytes Absolute Automated: 3.11 10*3/uL (ref 0.42–3.22)
Lymphocytes Automated: 25 %
MCH: 30.5 pg (ref 25.1–33.5)
MCHC: 31.8 g/dL (ref 31.5–35.8)
MCV: 96.1 fL — ABNORMAL HIGH (ref 78.0–96.0)
MPV: 9.7 fL (ref 8.9–12.5)
Monocytes Absolute Automated: 1.55 10*3/uL — ABNORMAL HIGH (ref 0.21–0.85)
Monocytes: 12.5 %
Neutrophils Absolute: 7.52 10*3/uL — ABNORMAL HIGH (ref 1.10–6.33)
Neutrophils: 60.5 %
Nucleated RBC: 0 /100 WBC (ref 0.0–0.0)
Platelets: 251 10*3/uL (ref 142–346)
RBC: 3.08 10*6/uL — ABNORMAL LOW (ref 3.90–5.10)
RDW: 15 % (ref 11–15)
WBC: 12.42 10*3/uL — ABNORMAL HIGH (ref 3.10–9.50)

## 2022-05-31 LAB — BASIC METABOLIC PANEL
Anion Gap: 18 — ABNORMAL HIGH (ref 5.0–15.0)
BUN: 44 mg/dL — ABNORMAL HIGH (ref 7.0–21.0)
CO2: 24 mEq/L (ref 17–29)
Calcium: 8.9 mg/dL (ref 7.9–10.2)
Chloride: 97 mEq/L — ABNORMAL LOW (ref 99–111)
Creatinine: 13.6 mg/dL — ABNORMAL HIGH (ref 0.4–1.0)
Glucose: 185 mg/dL — ABNORMAL HIGH (ref 70–100)
Potassium: 4 mEq/L (ref 3.5–5.3)
Sodium: 139 mEq/L (ref 135–145)
eGFR: 2.4 mL/min/{1.73_m2} — AB (ref >=60)

## 2022-05-31 LAB — WHOLE BLOOD GLUCOSE POCT
Whole Blood Glucose POCT: 167 mg/dL — ABNORMAL HIGH (ref 70–100)
Whole Blood Glucose POCT: 139 mg/dL — ABNORMAL HIGH (ref 70–100)
Whole Blood Glucose POCT: 133 mg/dL — ABNORMAL HIGH (ref 70–100)
Whole Blood Glucose POCT: 164 mg/dL — ABNORMAL HIGH (ref 70–100)

## 2022-05-31 SURGERY — IR MISC. CASE REQUEST
Anesthesia: IV Sedation | Site: Abdomen

## 2022-05-31 SURGERY — DRAIN (OTHER)
Anesthesia: Local

## 2022-05-31 MED ORDER — FENTANYL CITRATE (PF) 50 MCG/ML IJ SOLN (WRAP)
25.0000 ug | INTRAMUSCULAR | Status: AC | PRN
Start: 2022-05-31 — End: 2022-05-31
  Administered 2022-05-31 (×2): 50 ug via INTRAVENOUS

## 2022-05-31 MED ORDER — NALOXONE HCL 0.4 MG/ML IJ SOLN (WRAP)
0.1000 mg | INTRAMUSCULAR | Status: AC | PRN
Start: 2022-05-31 — End: 2022-05-31

## 2022-05-31 MED ORDER — DIPHENHYDRAMINE HCL 50 MG/ML IJ SOLN
25.0000 mg | Freq: Once | INTRAMUSCULAR | Status: DC | PRN
Start: 2022-05-31 — End: 2022-06-01

## 2022-05-31 MED ORDER — MIDAZOLAM HCL 1 MG/ML IJ SOLN (WRAP)
INTRAMUSCULAR | Status: AC
Start: 2022-05-31 — End: ?
  Filled 2022-05-31: qty 2

## 2022-05-31 MED ORDER — ALBUTEROL-IPRATROPIUM 2.5-0.5 (3) MG/3ML IN SOLN
3.0000 mL | Freq: Four times a day (QID) | RESPIRATORY_TRACT | Status: DC | PRN
Start: 2022-05-31 — End: 2022-06-04

## 2022-05-31 MED ORDER — FENTANYL CITRATE (PF) 50 MCG/ML IJ SOLN (WRAP)
INTRAMUSCULAR | Status: AC
Start: 2022-05-31 — End: ?
  Filled 2022-05-31: qty 2

## 2022-05-31 MED ORDER — ALBUTEROL-IPRATROPIUM 2.5-0.5 (3) MG/3ML IN SOLN
3.0000 mL | Freq: Every evening | RESPIRATORY_TRACT | Status: DC
Start: 2022-05-31 — End: 2022-06-01
  Administered 2022-05-31: 3 mL via RESPIRATORY_TRACT
  Filled 2022-05-31: qty 3

## 2022-05-31 MED ORDER — ALBUTEROL-IPRATROPIUM 2.5-0.5 (3) MG/3ML IN SOLN
3.0000 mL | Freq: Four times a day (QID) | RESPIRATORY_TRACT | Status: DC | PRN
Start: 2022-05-31 — End: 2022-05-31
  Administered 2022-05-31: 3 mL via RESPIRATORY_TRACT
  Filled 2022-05-31: qty 3

## 2022-05-31 MED ORDER — HEPARIN SODIUM (PORCINE) 5000 UNIT/ML IJ SOLN
5000.0000 [IU] | Freq: Three times a day (TID) | INTRAMUSCULAR | Status: DC
Start: 2022-06-02 — End: 2022-06-04
  Administered 2022-06-02 – 2022-06-04 (×7): 5000 [IU] via SUBCUTANEOUS
  Filled 2022-05-31 (×8): qty 1

## 2022-05-31 MED ORDER — IHS HOLD MEDICATION FOR PROCEDURE
Status: AC | PRN
Start: 2022-06-01 — End: 2022-06-02

## 2022-05-31 MED ORDER — FLUMAZENIL 0.5 MG/5ML IV SOLN
0.2000 mg | INTRAVENOUS | Status: DC | PRN
Start: 2022-05-31 — End: 2022-05-31

## 2022-05-31 MED ORDER — MIDAZOLAM HCL 1 MG/ML IJ SOLN (WRAP)
0.5000 mg | INTRAMUSCULAR | Status: AC | PRN
Start: 2022-05-31 — End: 2022-05-31

## 2022-05-31 MED ORDER — ONDANSETRON HCL 4 MG/2ML IJ SOLN
4.0000 mg | Freq: Once | INTRAMUSCULAR | Status: DC
Start: 2022-05-31 — End: 2022-06-04

## 2022-05-31 MED ORDER — ALBUTEROL SULFATE HFA 108 (90 BASE) MCG/ACT IN AERS
2.0000 | INHALATION_SPRAY | Freq: Four times a day (QID) | RESPIRATORY_TRACT | Status: DC | PRN
Start: 2022-05-31 — End: 2022-05-31
  Filled 2022-05-31: qty 8

## 2022-05-31 SURGICAL SUPPLY — 4 items
CATHETER OD5 FR L100 CM RADIOPAQUE BRAID (Catheter Miscellaneous) ×1 IMPLANT
CATHETER OD5 FR L100 CM RADIOPAQUE BRAID ATRAUMATIC TIP ANGLE VERT (Catheter Miscellaneous) IMPLANT
GUIDEWIRE VASCULAR OD.035 IN L180 CM L3 (Guidewire) ×1 IMPLANT
GUIDEWIRE VASCULAR OD.035 IN L180 CM L3 CM GLIDEWIRE STANDARD ANGLE (Guidewire) IMPLANT

## 2022-05-31 NOTE — UM Notes (Signed)
Continued Stay Review 05/30/22    PATIENT NAME: Shelby Barron, Shelby Barron   DOB: [birth year]     Adult Admit to Inpatient (Order #[id number]) on 05/26/22     Admit Diagnosis: SOB (shortness of breath) [R06.02]  Peritonitis [K65.9]  Facility: [facility]    Reason for Continued Stay  -82 year old female admitted to inpatient at [facility] on 5/8. Patient past medical history GERD, COPD, DMII, RCC s/p partial nephrectomy, gout, ESRD on PD, CVA, HTN, HLD, anemia, underwent cystoscopy on 04/19/22 in [place] and was found to have a large bladder tumor involving the trigone and bladder neck which was resected. She underwent staging CT scan on 04/29/2022, which showed moderate right hydroureteronephrosis, which is stable down to the level of the UVJ present for over a year. She had a low density right lower pole renal lesion and small pulmonary nodules measuring up to 5 mm; felt to be likely benign. The ultimate plan is for cystectomy and nephrectomy with formation of an [place] pouch, but patient needs cardiac clearance before surgery can be scheduled. Recently, patient began having severe pain with urination. She was placed on a 5 day course of Levaquin which she completed on 5/5. Patient presents with pain with urination, weakness and constant suprapubic and lower abdominal tenderness. CT in ED showed  new finding of grade 3 pronounced right -sided hydronephrosis and diffuse ureterectasis to the level of the right UVJ and several small masses of the right kidney. Patient remains on IV antibiotics.     Vital Signs  Temp 98.2  HR 81  RR 15  BP 103/53  SPO2 98% on RA    Abnormal Labs   Latest Reference Range & Units 05/30/22 04:53   WBC 3.10 - 9.50 x10 3/uL 11.60 (H)   Hemoglobin 11.4 - 14.8 g/dL 8.8 (L)   Hematocrit 22.0 - 43.7 % 27.5 (L)   RBC 3.90 - 5.10 x10 6/uL 2.89 (L)   Instrument Absolute Neutrophil Count 1.10 - 6.33 x10 3/uL 7.41 (H)   Monocytes Absolute Automated 0.21 - 0.85 x10 3/uL 1.42 (H)   Immature Granulocytes  Absolute 0.00 - 0.07 x10 3/uL 0.14 (H)   Neutrophils Absolute 1.10 - 6.33 x10 3/uL 7.41 (H)   Glucose 70 - 100 mg/dL 254 (H)   BUN 7.0 - 27.0 mg/dL 62.3 (H)   Creatinine 0.4 - 1.0 mg/dL 76.2 (H)   Chloride 99 - 111 mEq/L 98 (L)   Anion Gap 5.0 - 15.0  17.0 (H)   eGFR >=60 mL/min/1.73 m2 2.3 !   Phosphorus 2.3 - 4.7 mg/dL 6.2 (H)       Imaging  XR Chest AP Portable    Result Date: 05/30/2022  No acute cardiopulmonary disease. No change from XR CHEST AP ONLY with report dated 05/26/2022 4:03 PM. [name], MD 05/30/2022 1:12 PM    Plan of Care  Per Nephrology  Cont CCPD as ordered  NPO after midnight - VIR consult in a.m. for wire manipulation of PD catheter.  If still with slow drain then, prob need Surg eval though as it is still working (though slowly) this could be done as outpt likely  Cont renvela  Abx per ID     Per Medicine  Sepsis 2/2 UTI resolved   - lactic normal, leukocytosis improving   -Blood culture and urine culture remain negative  -Given patient failed outpatient Levaquin infectious disease consulted recommended to continue Zosyn over the weekend  -Pending final recommendation regarding duration of  treatment from ID discussed with Dr. Tyler Deis     High grade urothelioma bladder CA   Obstructive uropathy with grade 3 hydronephrosis  RCC s/p partial nephrectomy    - Followed by Dr Celine Mans outpatient with plans for cystectomy and nephrectomy   - Urology consulted, since no pyelo on imaging likely nothing to do for worsening hyrdo this admission   - CTAP with grade 3 R sided hydronephrosis and ureterectasis to the R UVJ. Several small masses of the R kidney which may be solid masses vs complex cysts.  - Pain control as ordered. Pt does not feel her pain is well controlled today, required IV pain medication today 5/10     Malfunctioning peritoneal dialysis portal  At this point peritoneal dialysis fairly working per nephrology and recommend to continue  Ordered abdominal x-ray to see if it is kinked if not  possible manipulation by IR as an outpatient.  Nephrology is planning IR to evaluate peritoneal dialysis tube on Monday     COPD   - stable, chest x-ray unremarkable   - resume albuterol PRN   - resume anoro ellipta & singulair per home dose     HTN  HLD  H/o CVA  Continue metoprolol/atorvastatin     ESRD on PD  HAGMA 2/2 ESRD   - nephrology consulted, continue PD as ordered   - resume nephrovite, sensipar, renvela   - hold torsemide & aldactone given soft Bps on admission   - trend BMP     DMII   - continue reduced dose lantus 10 U daily   - low dose SSI   - carb consistent diet   - A1C 8.5     GERD   - Continue PPI     Gout   - Continue allopurinol     Iron deficient anemia  Anemia of chronic disease   - resume iron supplement   - follow h/h     - hold ESA given ca       Nutrition: renal diet    Medications  Current Facility-Administered Medications   Medication Dose Route Frequency Last Rate Last Admin    acetaminophen (TYLENOL) tablet 650 mg  650 mg Oral Q6H PRN   650 mg at 05/29/22 1155    albuterol-ipratropium (DUO-NEB) 2.5-0.5(3) mg/3 mL nebulizer 3 mL  3 mL Nebulization QPM        albuterol-ipratropium (DUO-NEB) 2.5-0.5(3) mg/3 mL nebulizer 3 mL  3 mL Nebulization Q6H PRN        allopurinol (ZYLOPRIM) tablet 100 mg  100 mg Oral Daily   100 mg at 05/31/22 0811    atorvastatin (LIPITOR) tablet 40 mg  40 mg Oral Daily   40 mg at 05/31/22 0811    b complex-vitamin c-folic acid (NEPHRO-VITE) tablet 1 tablet  1 tablet Oral Daily   1 tablet at 05/31/22 0811    benzocaine-menthol (CEPACOL/CHLORASEPTIC) lozenge 1 lozenge  1 lozenge Buccal Q2H PRN        benzonatate (TESSALON) capsule 100 mg  100 mg Oral TID PRN        carboxymethylcellulose (REFRESH TEARS) 0.5 % ophthalmic solution 1 drop  1 drop Both Eyes TID PRN        cinacalcet (SENSIPAR) tablet 30 mg  30 mg Oral Daily   30 mg at 05/29/22 0805    clopidogrel (PLAVIX) tablet 75 mg  75 mg Oral Daily   75 mg at 05/31/22 0811    dextrose (GLUCOSE) 40 % oral gel  15 g  of glucose  15 g of glucose Oral PRN        Or    dextrose (D10W) 10% bolus 125 mL  12.5 g Intravenous PRN        Or    dextrose 50 % bolus 12.5 g  12.5 g Intravenous PRN        Or    glucagon (rDNA) (GLUCAGEN) injection 1 mg  1 mg Intramuscular PRN        dianeal low calcium (2.5 mEq/L) 1.5% dextrose peritoneal fluid 2,000 mL  2,000 mL Dialysis Continuous        dianeal low calcium (2.5 mEq/L) 1.5% dextrose peritoneal fluid 5,000 mL  5,000 mL Dialysis Continuous        dianeal low calcium (2.5 mEq/L) 1.5% dextrose peritoneal fluid 5,000 mL  5,000 mL Dialysis Continuous        diphenhydrAMINE (BENADRYL) capsule 25 mg  25 mg Oral QHS PRN   25 mg at 05/27/22 0127    heparin (porcine) injection 5,000 Units  5,000 Units Subcutaneous Q8H SCH   5,000 Units at 05/31/22 0612    HYDROmorphone (DILAUDID) injection 0.2 mg  0.2 mg Intravenous Q4H PRN   0.2 mg at 05/31/22 0914    insulin glargine (LANTUS) injection 10 Units  10 Units Subcutaneous QAM   10 Units at 05/31/22 0811    insulin lispro injection 1-3 Units  1-3 Units Subcutaneous QHS   1 Units at 05/30/22 2239    insulin lispro injection 1-5 Units  1-5 Units Subcutaneous TID AC   2 Units at 05/30/22 1610    iron polysaccharides (FERREX 150 / NIFEREX) capsule 150 mg  150 mg Oral Daily        melatonin tablet 3 mg  3 mg Oral QHS PRN        metoprolol tartrate (LOPRESSOR) half tablet 12.5 mg  12.5 mg Oral Daily   12.5 mg at 05/31/22 0811    montelukast (SINGULAIR) tablet 10 mg  10 mg Oral QHS   10 mg at 05/30/22 2226    naloxone (NARCAN) injection 0.2 mg  0.2 mg Intravenous PRN        ondansetron (ZOFRAN-ODT) disintegrating tablet 4 mg  4 mg Oral Q6H PRN   4 mg at 05/29/22 0222    Or    ondansetron (ZOFRAN) injection 4 mg  4 mg Intravenous Q6H PRN   4 mg at 05/31/22 0907    oxybutynin XL (DITROPAN-XL) 24 hr tablet 5 mg  5 mg Oral Daily   5 mg at 05/30/22 0857    oxyCODONE (ROXICODONE) immediate release tablet 5 mg  5 mg Oral Q6H PRN   5 mg at 05/29/22 2117    pantoprazole  (PROTONIX) EC tablet 40 mg  40 mg Oral Q12H   40 mg at 05/31/22 0812    piperacillin-tazobactam (ZOSYN) 2.25 g in sodium chloride 0.9 % 100 mL mini-bag plus  2.25 g Intravenous Q8H   Stopped at 05/31/22 0641    polyethylene glycol (MIRALAX) packet 17 g  17 g Oral Daily PRN   17 g at 05/28/22 1255    prochlorperazine (COMPAZINE) injection 5 mg  5 mg Intravenous Once        saline (OCEAN NASAL SPRAY) 0.65 % nasal solution 2 spray  2 spray Each Nare Q4H PRN        senna-docusate (PERICOLACE) 8.6-50 MG per tablet 2 tablet  2 tablet Oral QHS   2 tablet at 05/28/22  2209    sevelamer carbonate (RENVELA) tablet 1,600 mg  1,600 mg Oral BID AC   1,600 mg at 05/30/22 1610    umeclidinium-vilanterol (ANORO ELLIPTA) 62.5-25 MCG/ACT 1 puff  1 puff Inhalation QAM   1 puff at 05/31/22 0902       Primary Coverage:  Payor: MEDICARE [employer] / Plan: UHC MED ADV [phone] / Product Type: MANAGED MEDICARE /     UTILIZATION REVIEW CONTACT: Wendall Stade RN, BSN  Utilization Review   [facility]  [phone]   [phone]  Email: [email or web address]  Tax ID:  [id number]         NOTES TO REVIEWER:    This clinical review is based on/compiled from documentation provided by the treatment team within the patient's medical record.

## 2022-05-31 NOTE — Progress Notes (Signed)
Office: [phone]  Epic GroupChat: "[facility])"    Date Time: 05/31/22 @NOW   Patient Name: Shelby Barron, Shelby Barron      Problem List:      Admission with abd pain  Recently recognized bladder cancer with extension to ureter  Pyuria/ hematuria              -- recent dx of "UTI" treated with levaquin              -- prior h/o enterococcal UTI (march 2024)                          -- amp sensitive   -- Ucx 5/8 10-30,000 urogenital flora. But after abx   -- bcx NGTD     PD fluid clear  -------------------------------------  Chronic conditions  Diabetes  Hypertension  H/O CVA  ESRD, on peritoneal dialysis  Prior h/o renal cell CA on left  -- s/p partial L nephrectomy  Gout  COPD  Allergy to sulfa      Interval Events/Subjective:   NAEO   Afebrile   WBCs 12    Antimicrobials:     #3 zosyn 5/11-    Prior  Cefepime 1 gram IV daily #4     Estimated Creatinine Clearance: 3.3 mL/min (A) (based on SCr of 13.6 mg/dL (H)).      Assessment:   Elderly lady with bladder cancer with extension into ureters with pyuria/ hematuria. No fever but has leucocytosis. It may be that the active urine sediment may be due to tumor but she is at risk for infection and has had recent abx, which may not make the current cx acurate. She does have a prior h/o enterococcus in the urine.     Plan:   Changed cefepime to zosyn, as empiric coverage with enterococcal activity.     Ucx unrevealing (was on abx). Prior hx of enterococcus. At discharge can switch to PO augmentin to complete 7d course EOT 5/17    Monitoring clinical response and untoward effects of high risk/IV antimicrobials    Lines:     Patient Lines/Drains/Airways Status       Active PICC Line / CVC Line / PIV Line / Drain / Airway / Intraosseous Line / Epidural Line / ART Line / Line / Wound / Pressure Ulcer / NG/OG Tube       Name Placement date Placement time Site Days    Peripheral IV 05/29/22 20 G Right Antecubital 05/29/22  1620  Antecubital  1     Peritoneal Dialysis Catheter Mid lower abdomen 05/26/22  2000  -- 4                    *I have performed a risk-benefit analysis and the patient needs a central line for access and IV medications      Review of Systems:   General ROS: negative for - chills, fevers, night sweats, weight loss   HEENT: negative for - blurry vision, sore throat, thrush   Respiratory ROS: negative for cough, SOB  Cardiovascular ROS: negative for - chest pain, palpitations   Gastrointestinal ROS: negative for - abdominal pain, nausea, vomiting, diarrhea  Genito-Urinary ROS: negative for - dysuria, urinary frequency/urgency   Musculoskeletal ROS: negative for - joint pain, joint stiffness or muscle pain   Dermatological ROS: negative for - rash and skin lesion changes   Neurological ROS: negative for - confusion, headache, dizziness  Hematological ROS: negative for - bruising, bleeding   Psychological ROS: negative for - changes in mood    Physical Exam:     Vitals:    05/31/22 0903   BP:    Pulse:    Resp:    Temp:    SpO2: 97%       Awake, alert, no distress, cooperative.    HENT normal. Mouth moist, face symmetric  Lungs coarse breath sounds, without rales, wheezing  Cv normal without murmur, rub, or gallop  Abdomen soft nontender, PD cath site not inflamed  Extrem normal without phlebitis or edema.   No rash.   Neurologic exam non-focal.   Normal mental status.         Family History:     Family History   Problem Relation Age of Onset    Cancer Mother     Heart disease Father     Cancer Maternal Aunt     Diabetes Maternal Aunt     Cancer Paternal Aunt        Social History:     Social History     Socioeconomic History    Marital status: Married     Spouse name: Not on file    Number of children: Not on file    Years of education: Not on file    Highest education level: Not on file   Occupational History    Not on file   Tobacco Use    Smoking status: Former    Smokeless tobacco: Never   Vaping Use    Vaping status: Never Used    Substance and Sexual Activity    Alcohol use: No    Drug use: No    Sexual activity: Not on file   Other Topics Concern    Not on file   Social History Narrative    Not on file     Social Determinants of Health     Financial Resource Strain: Not on file   Food Insecurity: No Food Insecurity (05/26/2022)    Hunger Vital Sign     Worried About Running Out of Food in the Last Year: Never true     Ran Out of Food in the Last Year: Never true   Transportation Needs: No Transportation Needs (05/26/2022)    PRAPARE - Therapist, art (Medical): No     Lack of Transportation (Non-Medical): No   Physical Activity: Not on file   Stress: Not on file   Social Connections: Unknown (02/01/2022)    [facility]    Social Network     Social Network: Not on file   Intimate Partner Violence: Not At Risk (05/26/2022)    Humiliation, Afraid, Rape, and Kick questionnaire     Fear of Current or Ex-Partner: No     Emotionally Abused: No     Physically Abused: No     Sexually Abused: No   Housing Stability: Unknown (05/26/2022)    Housing Stability Vital Sign     Unable to Pay for Housing in the Last Year: No     Number of Places Lived in the Last Year: Not on file     Unstable Housing in the Last Year: No       Allergies:     Allergies   Allergen Reactions    Sulfa Antibiotics Hives       Labs:     Lab Results   Component Value Date  WBC 12.42 (H) 05/31/2022    HGB 9.4 (L) 05/31/2022    HCT 29.6 (L) 05/31/2022    MCV 96.1 (H) 05/31/2022    PLT 251 05/31/2022     Lab Results   Component Value Date    CREAT 13.6 (H) 05/31/2022     Lab Results   Component Value Date    ALT 13 05/26/2022    AST 18 05/26/2022    ALKPHOS 78 05/26/2022    BILITOTAL 0.6 05/26/2022     Lab Results   Component Value Date    LACTATE 2.0 05/26/2022       Microbiology:     Microbiology Results (last 15 days)       Procedure Component Value Units Date/Time    Culture + Gram Stain,Aerobic, Body Fluid [[id number]] Collected: 05/27/22  1321    Order Status: Completed Specimen: Body Fluid from Peritoneal Fluid (Abdominal) Updated: 05/30/22 1501    Narrative:      ORDER#: [id number]                                    ORDERED BY: Georgiana Shore  SOURCE: Peritoneal Fluid (Abdominal) peritoneal fluidCOLLECTED:  05/27/22 13:21  ANTIBIOTICS AT COLL.:                                RECEIVED :  05/27/22 16:33  Stain, Gram                                FINAL       05/27/22 18:12  05/27/22   Few WBCs             No organisms seen             Stain performed on Cytospin (concentrated) specimen  Culture and Gram Stain, Aerobic, Body FluidPRELIM      05/30/22 15:01  05/28/22   Culture no growth to date. Final report to follow  05/29/22   Culture no growth to date. Final report to follow  05/30/22   Culture no growth to date. Final report to follow      Urine culture [[id number]] Collected: 05/26/22 2234    Order Status: Completed Specimen: Bladder Updated: 05/28/22 1742    Narrative:      ORDER#: [id number]                                    ORDERED BY: Leigh Aurora, JODY  SOURCE: Urine                                        COLLECTED:  05/26/22 22:34  ANTIBIOTICS AT COLL.:                                RECEIVED :  05/27/22 14:13  Culture Urine                              FINAL       05/28/22 17:42  05/28/22   10,000 - 30,000  CFU/MLof normal urogenital or skin microbiota, no             further work      Culture + Gram Stain,Aerobic, Body Fluid [[id number]]     Order Status: Sent Specimen: Body Fluid from Peritoneal Fluid (Abdominal)     Culture, Anaerobic Bacteria [[id number]]     Order Status: Sent Specimen: Other from Peritoneal Fluid (Abdominal)     Culture Blood Aerobic and Anaerobic [[id number]] Collected: 05/26/22 1604    Order Status: Completed Specimen: Arm from Blood, Venipuncture Updated: 05/30/22 2021    Narrative:      ORDER#: [id number]                                    ORDERED BY: Caroline More  SOURCE: Blood, Venipuncture Arm                       COLLECTED:  05/26/22 16:04  ANTIBIOTICS AT COLL.:                                RECEIVED :  05/26/22 19:56  Culture Blood Aerobic and Anaerobic        PRELIM      05/30/22 20:21  05/27/22   No Growth after 1 day/s of incubation.  05/28/22   No Growth after 2 day/s of incubation.  05/29/22   No Growth after 3 day/s of incubation.  05/30/22   No Growth after 4 day/s of incubation.      COVID-19 (SARS-CoV-2) and Influenza A/B, NAA (Liat Rapid) [[id number]] Collected: 05/26/22 1535    Order Status: Completed Specimen: Culturette from Nasopharyngeal Updated: 05/26/22 1601     Purpose of COVID testing Diagnostic -PUI     SARS-CoV-2 Specimen Source Nasal Swab     SARS CoV 2 Overall Result Not Detected     Comment: __________________________________________________  -A result of "Detected" indicates POSITIVE for the    presence of SARS CoV-2 RNA  -A result of "Not Detected" indicates NEGATIVE for the    presence of SARS CoV-2 RNA  __________________________________________________________  Test performed using the Roche cobas Liat SARS-CoV-2 assay. This assay is  only for use under the Food and Drug Administration's Emergency Use  Authorization. This is a real-time RT-PCR assay for the qualitative  detection of SARS-CoV-2 RNA. Viral nucleic acids may persist in vivo,  independent of viability. Detection of viral nucleic acid does not imply the  presence of infectious virus, or that virus nucleic acid is the cause of  clinical symptoms. Negative results do not preclude SARS-CoV-2 infection and  should not be used as the sole basis for diagnosis, treatment or other  patient management decisions. Negative results must be combined with  clinical observations, patient history, and/or epidemiological information.  Invalid results may be due to inhibiting substances in the specimen and  recollection should occur. Please see Fact Sheets for patients and providers  located:  WirelessDSLBlog.no           Influenza A Not Detected     Influenza B Not Detected     Comment: Test performed using the Roche cobas Liat SARS-CoV-2 & Influenza A/B assay.  This assay is only for use under the Food and Drug Administration's  Emergency Use Authorization. This is a multiplex real-time RT-PCR assay  intended for the simultaneous in  vitro qualitative detection and  differentiation of SARS-CoV-2, influenza A, and influenza B virus RNA. Viral  nucleic acids may persist in vivo, independent of viability. Detection of  viral nucleic acid does not imply the presence of infectious virus, or that  virus nucleic acid is the cause of clinical symptoms. Negative results do  not preclude SARS-CoV-2, influenza A, and/or influenza B infection and  should not be used as the sole basis for diagnosis, treatment or other  patient management decisions. Negative results must be combined with  clinical observations, patient history, and/or epidemiological information.  Invalid results may be due to inhibiting substances in the specimen and  recollection should occur. Please see Fact Sheets for patients and providers  located: http://www.rice.biz/.         Narrative:      o Collect and clearly label specimen type:  o PREFERRED-Upper respiratory specimen: One Nasal Swab in  Transport Media.  o Hand deliver to laboratory ASAP  Diagnostic -PUI    Culture Blood Aerobic and Anaerobic [[id number]] Collected: 05/26/22 1526    Order Status: Completed Specimen: Arm from Blood, Venipuncture Updated: 05/30/22 2021    Narrative:      ORDER#: [id number]                                    ORDERED BY: Caroline More  SOURCE: Blood, Venipuncture Arm                      COLLECTED:  05/26/22 15:26  ANTIBIOTICS AT COLL.:                                RECEIVED :  05/26/22 19:56  Culture Blood Aerobic and Anaerobic        PRELIM      05/30/22 20:21  05/27/22   No Growth after 1 day/s of incubation.  05/28/22   No Growth after 2 day/s of  incubation.  05/29/22   No Growth after 3 day/s of incubation.  05/30/22   No Growth after 4 day/s of incubation.      Culture Blood Aerobic and Anaerobic [[id number]]     Order Status: Canceled Specimen: Arm from Blood, Venipuncture             Rads:   XR Chest AP Portable    Result Date: 05/30/2022  No acute cardiopulmonary disease. No change from XR CHEST AP ONLY with report dated 05/26/2022 4:03 PM. [name], MD 05/30/2022 1:12 PM     Thank you for this interesting consult. During the week, please reach out to author of the most recent ID note.     Signed by: Armen Pickup, MD

## 2022-05-31 NOTE — Progress Notes (Signed)
Referral from Dr Sterling Big - pt to have PD cath removed and transition to in-center HD.  Writer spoke with pt by phone, pt indicates that she does not plan to return to [facility] and will d/c to [address] [place]. Pt is unaware of whether change in modality is temp or permanents.  Writer to wait for pt's to get questions answered.     Gypsy Decant, HD Care Coordinator  [phone]

## 2022-05-31 NOTE — Progress Notes (Addendum)
Start [facility] Note    May  20   Initial Evaluation 10:40 AM  Arrive by 10:25 AM  Shelby Barron  [address]  [address]  [place] [address]  [phone]  Home Barron Referral    Referral from  Morral, Darden Amber, RN (Case Manager) for home Barron care upon discharge.    By [employer], the patient has the right to freely choose a home care provider.    A company of the patients choosing. We have supplied the patient with a listing of providers in your area who asked to be included and participate in Medicare.   Shelby Barron, formerly [facility], a home care agency that provides adult home care services and participates in Medicare   The preferred provider of your insurance company. Choosing a home care provider other than your insurance company's preferred provider may affect your insurance coverage.      Home Barron Discharge Information    Your doctor has ordered Skilled Nursing, Physical Therapy, Occupational Therapy, and Home Barron Aide in-home service(s) for you while you recuperate at home, to assist you in the transition from hospital to home.    The agency that you or your representative chose to provide the service:  Name of Home Barron Agency Placement: Other (comment box) ([facility] Phone: [phone])]          The above services were set up by:  Shelby Brunette, RN [facility] Liaison) Phone [phone]      IF YOU HAVE NOT HEARD FROM YOUR HOME YOUR HOME Barron AGENCY WITHIN 24-48 HOURS AFTER DISCHARGE PLEASE CALL YOUR AGENCY TO ARRANGE A TIME FOR YOUR FIRST VISIT. FOR ANY SCHEDULING CONCERNS OR QUESTIONS RELATED TO HOME Barron, SUCH AS TIME OR DATE PLEASE CONTACT YOUR HOME Barron AGENCY AT THE NUMBER LISTED ABOVE.    Additional comments:        START PATIENT REGISTRATION INFORMATION     Order Information  Order Signing Physician: Shelby Orem, MD    Service Ordered RN ?: Yes  Service Ordered PT ?: Yes  Service Ordered OT  ?: Yes  Service Ordered ST ?: No    Service Ordered MSW?: No    Service Ordered HHA?: Yes  Following Physician: Shelby Lager, MD   Following Physician Phone: None   Overseeing Physician: N/A  (Required for Residents only)   Agreeable to Follow?: N/A  Spoke with: N/A  Date/Time of Call: 05/31/22 3:05 PM      Care Coordination   SOC Call from Shelby Barron Required?: no  Same Day [facility]?: no  Primary Care Physician:PCP Not on File, MD  Primary Care Physician Phone:None  Primary Care Physician Address: None  PCP NPI:   Visit Instructions: N/A  Service Discharge Location Type: Home  Service Facility Name: N/A  Service Floor Facility: N/A  Service Room No: N/A    Demographics  Patient Last Name: Shelby Barron   Patient First Name: Shelby Barron  Language/Communication Barrier: no  Service Address: [address] [place] [address]   Service Home Phone: [phone] (home)   Other phone numbers:    Telephone Information:   Mobile [phone]     Emergency Contact: Extended Emergency Contact Information  Primary Emergency Contact: Shelby Barron, [place]  Mobile Phone: [phone]  Relation: Daughter    Admission Information  Admit  Date: 05/26/2022  Patient Status at discharge: Inpatient  Admitting Diagnosis: SOB (shortness of breath) [R06.02]  Peritonitis [K65.9]     Caregiver Information  Caregiver First Name: N/A  Caregiver Last Name: N/A  Caregiver Relationship to Patient: Other  Caregiver Phone Number: N/A  Caregiver Notes: N/A            Data processing manager Information  Primary Subscriber:   Primary Subscriber Relation To Guarantor:   Primary Payor:   Primary Plan:   Primary Group #:    Primary Subscriber ID:    Primary Subscriber DOB:   Secondary Insurance Information  Secondary Subscriber:   Secondary Subscriber Relation To Guarantor:   Secondary Payor:   Secondary Plan:   Secondary Group #:   Secondary Subscriber ID:   Secondary Subscriber DOB:    HITECH  NO      END PATIENT REGISTRATION INFORMATION       Diagnosis: SOB (shortness of breath) [R06.02]  Peritonitis [K65.9]    Start [facility] Summary        Additional Comments:          Home Barron face-to-face (FTF) Encounter (Order [id number])  Consult  Date: 06/04/2022 Department: [facility] [place] Ordering/Authorizing: Shelby Orem, MD     Order Information    Order Date/Time Release Date/Time Start Date/Time End Date/Time   06/04/22 10:08 AM None 06/04/22 10:08 AM 06/04/22 10:08 AM     Order Details    Frequency Duration Priority Order Class   Once 1  occurrence Routine Hospital Performed     Standing Order Information    Remaining Occurrences Interval Last Released     0/1 Once 06/04/2022              Provider Information    Ordering User Ordering Provider Authorizing Provider   Shelby Brunette, RN Yousuf, Maris Berger, MD Shelby Orem, MD   Attending Provider(s) Admitting Provider PCP   Kari Baars, MD; Jonni Sanger, MD; Georgiana Shore, MD; Lennox Pippins, MD; Shelby Orem, MD Jonni Sanger, MD Shelby Lager, MD     Verbal Order Info    Action Created on Order Mode Entered by Responsible Provider Signed by Signed on   Ordering 06/04/22 1008 Telephone with readback Shelby Brunette, RN Yousuf, Maris Berger, MD             Comments    Provider to follow: Shelby Lager, MD -- None    Home nursing required for skilled assessment including cardiopulmonary assessment and dietary education for disease management, and medication instruction. Home PT/OT required for gait and balance training, strengthening, mobility, fall prevention, and ADL training. Home Barron aide required for daily bathing/grooming and all other ADLs as patient transitions from acute care to home.    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  Profound weakness, poor balance/unsteady gait d/t illness/treatment/procedure    C.  Decreased endurance, strength, ROM, cadence, safety/judgment during mobility    F.  Deconditioned due to advance disease process requiring assistance to leave home    G.  Fall risk due to impaired coordination, gait and decreased balance    J.  Advanced age with frailty factors affecting safe ambulation & needs supervision    L.  Transfer/ambulation requires stand by assist and verbal cues to perform safely    M.  Unsteady gait, poor ambulation with history of falls   Medical conditions that necessitate Home Barron care: B.  Functional impairment due to recent hospitalization/procedure/treatment    C.  Risk for complication/infection/pain requiring follow up and monitoring    D.  Chronic illness & risk for re-hospitalization due to unstable disease status    E.  Exacerbation of disease requiring follow up monitoring   Per clinical findings, following services are medically necessary: Skilled Nursing    PT    OT   Clinical findings that support the need for Skilled Nursing. SN will: C. Monitor for signs and symptoms of exacerbation of disease and management    D. Review medication reconciliation, manage and educate on use and side effects    G. Educate on new diagnosis, treatment & management to prevent re-hospitalization    H. Assess cardiopulmonary status and monitor for signs &symptoms of exacerbation    I.  Educate dietary and or fluid restrictions and weight management   Clinical findings that support the need for Physical Therapy. PT will B.  Educate on post-surgical care, restrictions and management of complications    C.  Educate on weight  bearing status, stair/gait training, balance & coordination    D.  Provide services to help restore function, mobility, and releive pain    E.  Educate on functional mobility; bed, chair, sit, stand and transfer activities    F.  Perform home safety assessment & develop safe in home exercise program   OT will provide assistance with: Home program to improve ability to perform ADLs    Recovery and maintenance skills    Basic motor function and reasoning abilities    Restorative program to improve mobility and independence    Strategies to compensate for loss of function   Other (please specify) See comments section for orders/Home Barron Aide                    Process Instructions    Please select Home Care Services medically necessary.    Based on the above findings, I certify that this patient is confined to the home and needs intermittent skilled nursing care, physical therapry and / or speech therapy or continues to need occupational therapy. The patient is under my care, and I have initiated the establishment of the plan of care. This patient will be followed by a physician who will periodically review the plan of care.     Collection Information            Consult Order Info    ID Description Priority Start Date Start Time   [id number] Home Barron face-to-face (FTF) Encounter Routine 06/04/2022 10:08 AM   Provider Specialty Referred to   ______________________________________ _____________________________________                         Verbal Order Info    Action Created on Order Mode Entered by Responsible Provider Signed by Signed on   Ordering 06/04/22 1008 Telephone with readback Toniann Ket  C, RN Shelby Orem, MD             Patient Information    Patient Name  Shelby Barron, Shelby Barron Legal Sex  Female DOB  1940-06-05       Reprint Order Requisition    Home Barron face-to-face (FTF) Encounter (Order #[id number]) on 06/04/22       Additional Information    Associated Reports External References   Priority and  Order Details [employer]           Home Barron face-to-face (FTF) Encounter (Order #[id number]) on 05/31/22       Additional Information    Associated Reports External References   Priority and Order Details [employer]            [facility]        Patient Name: Shelby Barron, Shelby Barron     MRN: [id number]      CSN: [id number]         Account Information     Hosp Acct #   [id number] Patient Class   Inpatient Service  Medicine Accommodation Code  Semi-Private      Admission Information     Admitting Physician:  Attending Physician: Jonni Sanger, MD  Shelby Orem, MD Unit  [facility] [address] [facility]  L&D Status      Admitting Diagnosis: SOB (shortness of breath); Peritonitis Room / Bed  A4027/A4027.01 L&D - Last Menstrual Cycle      Chief Complaint: Abdominal Pain; Shortnes*     Admit Type:  Admit Date/Time:  Discharge Date/Time: Emergency  05/26/2022 / 1458   /  Length of Stay: 5 Days    L&D EDD   Estimated Date of Delivery: None noted.      Patient Information              Home Address: [address] [place] [address] Employer:  Employer Address:       ,     Main Phone: [phone] Employer Phone:     SSN: [id number]       DOB: [birth year] (82 yrs)       Sex: Female Primary Care Physician: Shelby Lager, MD   Marital Status: Married Referring Physician:       No ref. provider found   Race: Black or African American       Ethnicity: Not of Hispanic/Latino/S*       Emergency Contacts  Name Home Phone Work Phone Mobile Phone Relationship Christella Hartigan   Barron,Shelby     [phone] Daughter           Guarantor Information     Guarantor Name: Shelby Barron, Shelby Barron Guarantor ID: [id number]   Guarantor Relationship to Pt: Self Guarantor Type: Personal/Family   Guarantor DOB:    [birth year] Billing Indicator: Nym Unable to Code      Guarantor Address: [address]   [place], [place] [address]          Guarantor Home Phone: [phone] Guarantor Employer:        Guarantor Work Phone:   Pensions consultant Emp Phone:                      [employer] Name: MEDICARE [facility] MED ADV [id number] Subscriber Name: [employer]   Insurance Address:    [address] [place] [address] Subscriber DOB: 12/04/1940      Subscriber ID: [id number]  Insurance Phone: [phone] Pt Relationship to Sub:   Self   Insurance ID:         Group Name:   Preauthorization #: [id number]   Group #: [id number] Preauthorization Days:        Secondary Insurance     Insurance Name: [facility] FOR LIFE Subscriber Name: [facility] Address:    [address]  [place], [place] [address] Subscriber DOB: 03/26/1938      Subscriber ID: [id number]   Insurance Phone: [phone] Pt Relationship to Sub:   Spouse   Insurance ID:         Group Name:   Preauthorization #:     Group #:   Preauthorization Days:        [employer] Name: - Subscriber Name:     Community education officer Address:       ,   Statistician DOB:        Subscriber ID:     Press photographer:   Pt Relationship to Sub:       Insurance ID:         Group Name:   Preauthorization #:     Group #:   Preauthorization Days:           05/31/2022 3:10 PM       End PACC Summary     Discharge Date:  06/04/2022        Referral Source  Signed by: Shelby Brunette, RN  Date Time: 05/31/22 3:05 PM      End PACC Note

## 2022-05-31 NOTE — Progress Notes (Signed)
Pt is alert and oriented. Pt c/o abdominal pain.  CCPD ended d/t slow drain issue. Pt wanted to come off cycler. 4 of 5 cycle done. Notified Dr. Sterling Big and made aware  Total Net Fluid of 32 ml.  Reports were given to Primary nurse.       05/31/22 1030   Completing APD/CCPD (Taking the Patient OFF)    Effluent Appearance Clear;Yellow   Peritoneal Dialysis Comments CCPD ended, Nephrologist notified d/t slow drain.   Peritoneal Dialysis Catheter Mid lower abdomen   Placement Date/Time: 05/26/22 2000   Present on Admission?: Yes  Catheter Location: Mid lower abdomen   Site Assessment Clean;Dry;Intact   Dressing Open to air (None)

## 2022-05-31 NOTE — Sedation Documentation (Signed)
Shelby Barron to F Lab for PD Cath Check with possible interventions with Dr. Papadouris. Patient transferred to table in supine position. No interventions at this time. Patient tolerated procedure well, VSS.    100 mcg Fentanyl given    Pt transferred to unit with hospital transport. Report given to Alexus, RN.

## 2022-05-31 NOTE — Brief Op Note (Signed)
BRIEF VIR PROCEDURE NOTE    Date Time: 05/31/22 2:26 PM    Patient Name:   Shelby Barron    Date of Operation:   05/31/2022    Providers Performing:   Surgeon(s):  Manpreet Strey C, MD    Assistant (s): RT    Operative Procedure:   Procedure(s):  DRAIN (OTHER)    Preoperative Diagnosis:   Pre-Op Diagnosis Codes:     * Peritonitis [K65.9]    Postoperative Diagnosis:   pd cath check    Anesthesia:   IV fentanyl  A safety timeout was performed.   Estimated Blood Loss:   Minimal    Implants:   * No implants in log *    Findings:   Wire placed, PD cath was in the LLQ and unfolded but did not flip into the pelvis.  Recommend attempt at PD tonight and if it fails will need surgical revision.  D/W Dr. Sterling Big    Complications:   None                                                                  Signed by: Hope Pigeon, MD    VIR Department  [facility] [phone]  [facility] [phone]  [facility] [phone] (option 3-outpatient scheduling, option 4-consults, option 5-inpatient procedures)  [facility]:  [phone]  [facility]: [phone]  After hours/Answering service:  [phone]      AX IVR

## 2022-05-31 NOTE — Progress Notes (Signed)
LOS # 5      Summary of Discharge Plan: Home with [facility] PT/OT/SN       Identified Possible Discharge Barriers:medical condition      CM Interventions and Outcome:Received report from medical team for possible D/C today after CVIR. CM sent [facility] referral to [facility] today via secure Epic chat-greatly appreciated the assistance. [facility] referral sent. CM reached out to [facility] to f/u regarding PD and D/C plan via secure Epic chat.       Discussed above Discharge Plan with (patient, family, Care Team, others):yes      Case Management will continue to following on patient's discharge needs.     Natasha Bence, RN, BSN, CM  RN Care Manager I  [facility]  [phone]

## 2022-05-31 NOTE — Progress Notes (Signed)
[facility]  PROGRESS NOTE      Patient: Shelby Barron  Date: 05/31/2022   LOS: 5 Days  Admission Date: 05/26/2022   MRN: [id number]  Attending: Gayleen Orem, MD  When on service as the attending, please contact me on Epic Secure Chat from 7 AM - 7 PM for non-urgent issues. For urgent matters use XTend page from 7 AM - 7 PM.       ASSESSMENT/PLAN     Shelby Barron is a 82 y.o. female admitted with Peritonitis    Interval Summary: 82 y.o. 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   Patient was sent to the ER by her nephrologist for dysuria and suprapubic pain peristent despite 5d course of Levaquin.  CT showing new finding of grade 3 pronounced right -sided hydronephrosis and diffuse ureterectasis to the level of the right UVJ and several small masses of the right kidney. Urology consulted and did not recommend any acute intervention this admission for worsening hydro, just continue treatment with empiric antibiotics for UTI.         Patient Active Hospital Problem List:  Sepsis 2/2 UTI resolved   - lactic normal, leukocytosis improving   -Blood culture and urine culture remain negative  -Given patient failed outpatient Levaquin infectious disease consulted recommended to continue Zosyn over the weekend  -ID recommended discharging patient with Augmentin to complete 7-day course course of antibiotics.     High grade urothelioma bladder CA   Obstructive uropathy with grade 3 hydronephrosis  RCC s/p partial nephrectomy    - Followed by Dr Celine Mans outpatient with plans for cystectomy and nephrectomy   - Urology consulted, since no pyelo on imaging likely nothing to do for worsening hyrdo this admission   - CTAP with grade 3 R sided hydronephrosis and ureterectasis to the R UVJ.  Several small masses of the R kidney which may be solid masses vs complex cysts.  - Pain control as ordered. Pt does not feel her pain is well controlled today, required IV pain medication today 5/10    Malfunctioning peritoneal dialysis portal  At this point peritoneal dialysis fairly working per nephrology and recommend to continue  Ordered abdominal x-ray to see if it is kinked if not possible manipulation by IR as an outpatient.  Status post IR evaluation of the peritoneum post done on 05/31/2022 which is unsuccessful recommended surgical evaluation, nephrology planning to switch to hemodialysis      COPD   - stable, chest x-ray unremarkable   - resume albuterol PRN   - resume anoro ellipta & singulair per home dose     HTN  HLD  H/o CVA  Continue metoprolol/atorvastatin     ESRD on PD  HAGMA 2/2 ESRD   - nephrology consulted, continue PD as ordered   - resume nephrovite, sensipar, renvela   - hold torsemide & aldactone given soft Bps on admission   - Planning to do hemodialysis per nephrology     DMII   - continue reduced dose lantus 10 U daily   - low dose SSI   - carb consistent diet   - A1C 8.5     GERD   - Continue PPI     Gout   -  Continue allopurinol     Iron deficient anemia  Anemia of chronic disease   - resume iron supplement   - follow h/h     - hold ESA given ca        Nutrition: renal diet          Patient has BMI=Body mass index is 33.91 kg/m.  Diagnosis: Obesity based on BMI criteria                 Recent Labs   Lab 05/31/22  0515 05/30/22  0453 05/29/22  0903   Hgb 9.4* 8.8* 9.0*   Hematocrit 29.6* 27.5* 28.1*   MCV 96.1* 95.2 94.9   WBC 12.42* 11.60* 11.30*   Platelets 251 241 232         Anemia Diagnosis: Chronic: Anemia of Chronic Kidney Disease    Code Status: Full Code    Dispo: Pending hemodialysis catheter placement  Family Contact: Updated daughter over the phone  DVT Prophylaxis:   Current Facility-Administered Medications (Includes Only Anticoagulants, Misc. Hematological)   Medication  Dose Route Last Admin    clopidogrel (PLAVIX) tablet 75 mg  75 mg Oral 75 mg at 05/31/22 0811    heparin (porcine) injection 5,000 Units  5,000 Units Subcutaneous 5,000 Units at 05/31/22 1548    [START ON 06/02/2022] heparin (porcine) injection 5,000 Units  5,000 Units Subcutaneous            CHART  REVIEW & DISCUSSION     The following chart items were reviewed as of 4:36 PM on 05/31/22:  [x]  Lab Results [x]  Imaging Results   []  Problem List  [x]  Current Orders [x]  Current Medications  []  Allergies  [x]  Code Status [x]  Previous Notes   []  SDoH    The management and plan of care for this patient was discussed with the following specialty consultants:  []  Cardiology  []  Gastroenterology                 [x]  Infectious Disease  []  Pulmonology []  Neurology                [x]  Nephrology  []  Neurosurgery []  Orthopedic Surgery  []  Heme/Onc  []  General Surgery []  Psychiatry                                   [x]  Urology    SUBJECTIVE     Shelby Barron   No events reported overnight  Denies chest pain, lightheadedness, nausea,    MEDICATIONS     Current Facility-Administered Medications   Medication Dose Route Frequency    albuterol-ipratropium  3 mL Nebulization QPM    allopurinol  100 mg Oral Daily    atorvastatin  40 mg Oral Daily    b complex-vitamin c-folic acid  1 tablet Oral Daily    cinacalcet  30 mg Oral Daily    clopidogrel  75 mg Oral Daily    heparin (porcine)  5,000 Units Subcutaneous Q8H SCH    [START ON 06/02/2022] heparin (porcine)  5,000 Units Subcutaneous Q8H SCH    insulin glargine  10 Units Subcutaneous QAM    insulin lispro  1-3 Units Subcutaneous QHS    insulin lispro  1-5 Units Subcutaneous TID AC    iron polysaccharides  150 mg Oral Daily    metoprolol tartrate  12.5 mg Oral Daily    montelukast  10 mg Oral  QHS    ondansetron  4 mg Intravenous Once    oxybutynin XL  5 mg Oral Daily    pantoprazole  40 mg Oral Q12H    piperacillin-tazobactam  2.25 g Intravenous Q8H    prochlorperazine  5 mg Intravenous  Once    senna-docusate  2 tablet Oral QHS    sevelamer carbonate  1,600 mg Oral BID AC    umeclidinium-vilanterol  1 puff Inhalation QAM       PHYSICAL EXAM     Vitals:    05/31/22 1626   BP: 111/55   Pulse: 78   Resp: 15   Temp: 97.7 F (36.5 C)   SpO2: 98%       Temperature: Temp  Min: 97.3 F (36.3 C)  Max: 97.9 F (36.6 C)  Pulse: Pulse  Min: 62  Max: 90  Respiratory: Resp  Min: 15  Max: 24  Non-Invasive BP: BP  Min: 103/55  Max: 145/64  Pulse Oximetry SpO2  Min: 93 %  Max: 100 %    Intake and Output Summary (Last 24 hours) at Date Time  No intake or output data in the 24 hours ending 05/31/22 1636          GEN APPEARANCE: Normal;  A&OX3  HEENT: PERLA; EOMI; Conjunctiva Clear  NECK: Supple; No bruits  CVS: RRR, S1, S2; No M/G/R  LUNGS: CTAB; No Wheezes; No Rhonchi: No rales  ABD: Soft; No TTP; + Normoactive BS  EXT: No edema; Pulses 2+ and intact  NEURO: CN 2-12 intact; No Focal neurological deficits  MENTAL STATUS: Conversant and cooperative    LABS     Recent Labs   Lab 05/31/22  0515 05/30/22  0453 05/29/22  0903   WBC 12.42* 11.60* 11.30*   RBC 3.08* 2.89* 2.96*   Hgb 9.4* 8.8* 9.0*   Hematocrit 29.6* 27.5* 28.1*   MCV 96.1* 95.2 94.9   Platelets 251 241 232       Recent Labs   Lab 05/31/22  0515 05/30/22  0453 05/28/22  0943 05/27/22  0431 05/26/22  1526   Sodium 139 139 138 138 138   Potassium 4.0 3.8 4.5 4.3 5.0   Chloride 97* 98* 98* 100 95*   CO2 24 24 23 23 25    BUN 44.0* 50.0* 56.0* 57.0* 53.0*   Creatinine 13.6* 14.3* 14.8* 14.4* 13.6*   Glucose 185* 111* 185* 167* 98   Calcium 8.9 8.7 9.3 9.5 10.5*   Magnesium  --  2.3  --  2.5  --        Recent Labs   Lab 05/26/22  1526   ALT 13   AST (SGOT) 18   Bilirubin, Total 0.6   Albumin 3.2*   Alkaline Phosphatase 78       Recent Labs   Lab 05/26/22  1837 05/26/22  1526   hs Troponin-I 9.6 11.8   hs Troponin-I Delta -2.2  --              Microbiology Results (last 15 days)       Procedure Component Value Units Date/Time    Culture + Gram Azzie Glatter  Body Fluid [[id number]] Collected: 05/27/22 1321    Order Status: Completed Specimen: Body Fluid from Peritoneal Fluid (Abdominal) Updated: 05/31/22 1301    Narrative:      ORDER#: [id number]  ORDERED BY: NIKOVA, TEODORA  SOURCE: Peritoneal Fluid (Abdominal) peritoneal fluidCOLLECTED:  05/27/22 13:21  ANTIBIOTICS AT COLL.:                                RECEIVED :  05/27/22 16:33  Stain, Gram                                FINAL       05/27/22 18:12  05/27/22   Few WBCs             No organisms seen             Stain performed on Cytospin (concentrated) specimen  Culture and Gram Stain, Aerobic, Body FluidFINAL       05/31/22 13:01  05/31/22   No growth      Urine culture [[id number]] Collected: 05/26/22 2234    Order Status: Completed Specimen: Bladder Updated: 05/28/22 1742    Narrative:      ORDER#: [id number]                                    ORDERED BY: Leigh Aurora, JODY  SOURCE: Urine                                        COLLECTED:  05/26/22 22:34  ANTIBIOTICS AT COLL.:                                RECEIVED :  05/27/22 14:13  Culture Urine                              FINAL       05/28/22 17:42  05/28/22   10,000 - 30,000 CFU/MLof normal urogenital or skin microbiota, no             further work      Culture + Gram Stain,Aerobic, Body Fluid [[id number]]     Order Status: Sent Specimen: Body Fluid from Peritoneal Fluid (Abdominal)     Culture, Anaerobic Bacteria [[id number]]     Order Status: Sent Specimen: Other from Peritoneal Fluid (Abdominal)     Culture Blood Aerobic and Anaerobic [[id number]] Collected: 05/26/22 1604    Order Status: Completed Specimen: Arm from Blood, Venipuncture Updated: 05/30/22 2021    Narrative:      ORDER#: [id number]                                    ORDERED BY: Caroline More  SOURCE: Blood, Venipuncture Arm                      COLLECTED:  05/26/22 16:04  ANTIBIOTICS AT COLL.:                                RECEIVED :  05/26/22 19:56  Culture Blood  Aerobic and Anaerobic        PRELIM      05/30/22  20:21  05/27/22   No Growth after 1 day/s of incubation.  05/28/22   No Growth after 2 day/s of incubation.  05/29/22   No Growth after 3 day/s of incubation.  05/30/22   No Growth after 4 day/s of incubation.      COVID-19 (SARS-CoV-2) and Influenza A/B, NAA (Liat Rapid) [[id number]] Collected: 05/26/22 1535    Order Status: Completed Specimen: Culturette from Nasopharyngeal Updated: 05/26/22 1601     Purpose of COVID testing Diagnostic -PUI     SARS-CoV-2 Specimen Source Nasal Swab     SARS CoV 2 Overall Result Not Detected     Comment: __________________________________________________  -A result of "Detected" indicates POSITIVE for the    presence of SARS CoV-2 RNA  -A result of "Not Detected" indicates NEGATIVE for the    presence of SARS CoV-2 RNA  __________________________________________________________  Test performed using the Roche cobas Liat SARS-CoV-2 assay. This assay is  only for use under the Food and Drug Administration's Emergency Use  Authorization. This is a real-time RT-PCR assay for the qualitative  detection of SARS-CoV-2 RNA. Viral nucleic acids may persist in vivo,  independent of viability. Detection of viral nucleic acid does not imply the  presence of infectious virus, or that virus nucleic acid is the cause of  clinical symptoms. Negative results do not preclude SARS-CoV-2 infection and  should not be used as the sole basis for diagnosis, treatment or other  patient management decisions. Negative results must be combined with  clinical observations, patient history, and/or epidemiological information.  Invalid results may be due to inhibiting substances in the specimen and  recollection should occur. Please see Fact Sheets for patients and providers  located:  WirelessDSLBlog.no          Influenza A Not Detected     Influenza B Not Detected     Comment: Test performed using the Roche cobas Liat SARS-CoV-2 &  Influenza A/B assay.  This assay is only for use under the Food and Drug Administration's  Emergency Use Authorization. This is a multiplex real-time RT-PCR assay  intended for the simultaneous in vitro qualitative detection and  differentiation of SARS-CoV-2, influenza A, and influenza B virus RNA. Viral  nucleic acids may persist in vivo, independent of viability. Detection of  viral nucleic acid does not imply the presence of infectious virus, or that  virus nucleic acid is the cause of clinical symptoms. Negative results do  not preclude SARS-CoV-2, influenza A, and/or influenza B infection and  should not be used as the sole basis for diagnosis, treatment or other  patient management decisions. Negative results must be combined with  clinical observations, patient history, and/or epidemiological information.  Invalid results may be due to inhibiting substances in the specimen and  recollection should occur. Please see Fact Sheets for patients and providers  located: http://www.rice.biz/.         Narrative:      o Collect and clearly label specimen type:  o PREFERRED-Upper respiratory specimen: One Nasal Swab in  Transport Media.  o Hand deliver to laboratory ASAP  Diagnostic -PUI    Culture Blood Aerobic and Anaerobic [[id number]] Collected: 05/26/22 1526    Order Status: Completed Specimen: Arm from Blood, Venipuncture Updated: 05/30/22 2021    Narrative:      ORDER#: [id number]  ORDERED BY: Caroline More  SOURCE: Blood, Venipuncture Arm                      COLLECTED:  23-Jun-2022 15:26  ANTIBIOTICS AT COLL.:                                RECEIVED :  Jun 23, 2022 19:56  Culture Blood Aerobic and Anaerobic        PRELIM      05/30/22 20:21  05/27/22   No Growth after 1 day/s of incubation.  05/28/22   No Growth after 2 day/s of incubation.  05/29/22   No Growth after 3 day/s of incubation.  05/30/22   No Growth after 4 day/s of incubation.      Culture Blood  Aerobic and Anaerobic [[id number]]     Order Status: Canceled Specimen: Arm from Blood, Venipuncture              RADIOLOGY     XR Chest AP Portable    Result Date: 05/30/2022  No acute cardiopulmonary disease. No change from XR CHEST AP ONLY with report dated 2022/06/23 4:03 PM. [name], MD 05/30/2022 1:12 PM    XR ABDOMEN 2 VIEW    Result Date: 05/29/2022  No evidence of kinking of the peritoneal dialysis catheter in the lateral left abdomen/pelvis. Carlynn Spry, MD 05/29/2022 5:44 PM    CT Chest Abdomen Pelvis WO Contrast    Result Date: 06-23-22  1. Grade 3 pronounced right-sided hydronephrosis and diffuse ureterectasis to the level of the right ureterovesical junction of uncertain etiology. This is new from prior magnetic resonance imaging 2. Several small masses of the right kidney may represent small solid masses or complex cysts. These could be further evaluated by renal magnetic resonance imaging when patient is clinically stable [name], MD 2022-06-23 4:47 PM    Chest AP Only    Result Date: 23-Jun-2022  No acute cardiopulmonary disease. Fonnie Mu, DO 06-23-2022 4:03 PM   Echo Results       None          No results found for this or any previous visit.    Signed,  Gayleen Orem, MD  4:36 PM 05/31/2022

## 2022-05-31 NOTE — Progress Notes (Signed)
[facility] [employer] (previously [facility]):     Received a referral to schedule a follow up appointment with the [facility] for [employer].  Appointment scheduled for Monday 06/07/22 at 10:40 am with NP Zaiter at [place] location.      Clinic address is as follows:    [address]. [place], [address]    Please notify patient to arrive 15 minutes early to the appointment and bring the following materials with them:    Insurance card (if insured) and photo ID  Medications in their original bottles  Glucometer/blood sugar log (if diabetic)  Weight log (if heart failure)  Proof of income (to enroll in medication assistance programs-first two pages of signed 1040 tax forms or last 2 months of pay stubs)      Dawayne Patricia  Patient Access Associate II  [facility] for [employer]   [phone]

## 2022-05-31 NOTE — Progress Notes (Signed)
05/31/22 1556   Medicare Checklist   Is this a Medicare patient? Yes   Patient received 1st IMM Letter? Yes   If LOS 3 days or greater, did patient received 2nd IMM Letter? Yes   Date of 2nd IMM Letter 05/31/22   Time of 2nd IMM letter 1435     Brock Bad, CMS

## 2022-05-31 NOTE — Procedures (Signed)
Shelby Barron to [facility] for PD Cath Check with possible interventions with Dr. Welton Flakes. Patient transferred to table in supine position. No interventions at this time. Patient tolerated procedure well, VSS.    100 mcg Fentanyl given    Pt transferred to unit with hospital transport. Report given to Alexus, RN.

## 2022-05-31 NOTE — Progress Notes (Signed)
CVIR contacted by Dr Sterling Big for placement of tunneled HD catheter to transition patient to HD.     82 y/o female w/ PMH of ESRD on PD.   PD catheter currently dysfunctional.   Patient is s/p PD catheter check in CVIR earlier today, found to be patent, but malpositioned.       Allergies to Sulfa  Antiplatelet use- none anticoagulant use SQ Heparin  INR- None, creatinine 13.6, plts 251, hgb 9.4    Case reviewed, will tentatively plan for tunneled HD catheter placement in CVIR tomorrow.  NPO after midnight.  Hold AM SQ Heparin.  AM coag labs ordered.   Continue medical management per primary team and consultants.    Discussed with Dr. Sterling Big.        Marcha Dutton, NP  CVIR Department  [facility]  [phone]        I have evaluated the patient and agree with the above documented evaluation and plan.    Signed by: Hope Pigeon, MD    [facility]  [facility], Division of Vascular & Interventional Radiology  [facility]- [phone]  [facility] - [phone]  IF[facility]--3570 [facility]--8022 IFOH--3069  After Hours Service [phone]

## 2022-05-31 NOTE — Progress Notes (Addendum)
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  Aggressive bowel regimen as this will help her PD  Dialysis placement at [facility] - CM notified  Cont CCPD 1.5% tonight  Phos level tomorrow        Case discussed with: pt, IR, HD nurse    3 pm addendum - per IR, unable to reposition PD catheter into pelvis, will order permcath tomorrow and transition to iHD. D/w Daughter      Debara Pickett, MD  [facility]  [id number] (office)      Subjective:   PD still with issues draining overnight, total UF 32 mL  Pt having bowel movements daily       Review of Systems:   No cp      Physical Exam:     Vitals:    05/31/22 0015 05/31/22 0505 05/31/22 0658 05/31/22 0811   BP: 108/69 145/64 139/56 139/56   Pulse: 82 90 87 87   Resp: 18 18 15     Temp: 97.7 F (36.5 C) 97.7 F (36.5 C) 97.5 F (36.4 C)    TempSrc: Oral Oral Oral    SpO2: 96% 100% 97%    Weight:       Height:           Intake and Output Summary (Last 24 hours) at Date Time    Intake/Output Summary (Last 24 hours) at 05/31/2022 0859  Last data filed at 05/30/2022 1532  Gross per 24 hour   Intake 370 ml   Output --   Net 370 ml       General: awake, alert, no acute distress  Cardiovascular: regular rate and rhythm, no murmurs, rubs or gallops  Lungs: clear to auscultation bilaterally, without wheezing, rhonchi, or rales  Abdomen: soft, non-tender,  slightly distended, normoactive bowel sounds  Extremities: no edema      Meds:      Scheduled Meds: PRN Meds:    albuterol-ipratropium, 3 mL, Nebulization, QPM  allopurinol, 100 mg, Oral, Daily  atorvastatin, 40 mg, Oral, Daily  b complex-vitamin c-folic acid, 1 tablet, Oral, Daily  cinacalcet, 30 mg, Oral, Daily  clopidogrel, 75 mg, Oral, Daily  heparin (porcine), 5,000 Units, Subcutaneous, Q8H SCH  insulin glargine, 10 Units, Subcutaneous, QAM  insulin lispro, 1-3 Units, Subcutaneous, QHS  insulin lispro, 1-5 Units, Subcutaneous, TID AC  iron polysaccharides, 150 mg, Oral, Daily  metoprolol tartrate, 12.5 mg, Oral, Daily  montelukast, 10 mg, Oral, QHS  oxybutynin XL, 5 mg, Oral, Daily  pantoprazole, 40 mg, Oral, Q12H  piperacillin-tazobactam, 2.25 g, Intravenous, Q8H  prochlorperazine, 5 mg, Intravenous, Once  senna-docusate, 2 tablet, Oral, QHS  sevelamer carbonate, 1,600 mg, Oral, BID AC  umeclidinium-vilanterol, 1 puff, Inhalation, QAM  Continuous Infusions:   dianeal low calcium (2.5 mEq/L) 1.5% dextrose      dianeal low calcium (2.5 mEq/L) 1.5% dextrose      dianeal low calcium (2.5 mEq/L) 1.5% dextrose      acetaminophen, 650 mg, Q6H PRN  albuterol-ipratropium, 3 mL, Q6H PRN  benzocaine-menthol, 1 lozenge, Q2H PRN  benzonatate, 100 mg, TID PRN  carboxymethylcellulose sodium, 1 drop, TID PRN  dextrose, 15 g of glucose, PRN   Or  dextrose, 12.5 g, PRN   Or  dextrose, 12.5 g, PRN   Or  glucagon (rDNA), 1 mg, PRN  diphenhydrAMINE, 25 mg, QHS PRN  HYDROmorphone, 0.2 mg, Q4H PRN  melatonin, 3 mg, QHS PRN  naloxone, 0.2 mg, PRN  ondansetron, 4 mg, Q6H PRN   Or  ondansetron, 4 mg, Q6H PRN  oxyCODONE, 5 mg, Q6H PRN  polyethylene glycol, 17 g, Daily PRN  saline, 2 spray, Q4H PRN              Labs:     Recent Labs   Lab 05/31/22  0515 05/30/22  0453 05/29/22  0903   WBC 12.42* 11.60* 11.30*   Hgb 9.4* 8.8* 9.0*   Hematocrit 29.6* 27.5* 28.1*   Platelets 251 241 232     Recent Labs   Lab 05/31/22  0515  05/30/22  0453 05/28/22  0943 05/27/22  0431 05/26/22  1526   Sodium 139 139 138 138 138   Potassium 4.0 3.8 4.5 4.3 5.0   Chloride 97* 98* 98* 100 95*   CO2 24 24 23 23 25    BUN 44.0* 50.0* 56.0* 57.0* 53.0*   Creatinine 13.6* 14.3* 14.8* 14.4* 13.6*   Calcium 8.9 8.7 9.3 9.5 10.5*   Albumin  --   --   --   --  3.2*   Phosphorus  --  6.2*  --  6.7*  --    Magnesium  --  2.3  --  2.5  --    Glucose 185* 111* 185* 167* 98   eGFR 2.4* 2.3* 2.2* 2.3* 2.4*       Recent Labs   Lab 05/26/22  2234   Urine Type Urine, Catheriz   Color, UA Orange*   Clarity, UA Turbid*   Specific Gravity UA 1.020   Urine pH 6.5   Nitrite, UA Negative   Ketones UA Negative   Urobilinogen, UA Normal   Bilirubin, UA Negative   Blood, UA Moderate*   RBC, UA TNTC*   WBC, UA TNTC*           Imaging personally reviewed, including: XR Chest AP Portable    Result Date: 05/30/2022  No acute cardiopulmonary disease. No change from XR CHEST AP ONLY with report dated 05/26/2022 4:03 PM. [name], MD 05/30/2022 1:12 PM         Signed by: Debara Pickett, MD

## 2022-05-31 NOTE — Consults (Signed)
Date Time: 05/31/22 9:30 AM  Patient Name: Shelby Barron  Attending Physician: Gayleen Orem, MD  Consulting Physician: Elease Etienne, NP,  Farhaan Mabee Welton Flakes, MD    Assessment:   82 y/o female presents w/ slow draining from PD catheter, concern for position and clot vs fibrin sheath.    CVIR consulted for PD catheter check w/ possible intervention.  Plan:   Imaging reviewed, will tentatively plan for PD catheter check in CVIR today.   Continue NPO status.   Medical management per primary Medicine team and consulting specialists.     Discussed with patient, nursing staff, Dr Don Broach, Dr. Sterling Big  HPI/Reason for Consultation:     CVIR Team was asked by Dr. Sterling Big to evaluate Paula Libra Bornemann for PD catheter check.She is a 82 y.o. female w/ PMH of  GERD, COPD, DM2, RCC s/p partial nephrectomy, gout, ESRD on PD, CVA, HTN, HLD, and anemia admitted on 05/26/2022 with complaint of severe pain w/ urination, weakness as well as suprapubic/low abdominal tenderness. On evaluation, found to have likely sepsis r/t UTI refractory to outpatient abx course, currently on IV abx, ID following. 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 Patient pending plan for future cystectomy, nephrectomy w/ [place] pouch formation once she is cleared by Cardiology. During hospital course patient found to have persistent worsening slow drainage from PD catheter. Abdominal X-ray showing no kink in PD catheter. Nephrology following, requesting PD catheter evaluation. Patient currently resting in bed in no distress. She reports that she generally is not feeling well these days, but denies complaints at this time. She states her PD catheter was placed back in 2021. Currently VSS, afebrile. WBC 12.42, H/H 9.4/29.6, plts 251, Cr 13.6, K 4.0.      Past Medical History:     Past Medical History:    Diagnosis Date    Acid reflux     Asthma, well controlled     Cancer of kidney     Chronic lower back pain     COPD (chronic obstructive pulmonary disease)     CVA (cerebral infarction) Jan 12, 2003    Diabetes     Diabetic nephropathy     Fibroids     Gout     H/O: gout     Hyperlipidemia     Hypertension     Neuropathy of hand     SOB (shortness of breath)        Past Surgical History:     Past Surgical History:   Procedure Laterality Date    APPENDECTOMY (OPEN)      HERNIA REPAIR      HYSTERECTOMY      PARTIAL NEPHRECTOMY      tumor removed from kidney  cJanuary 2015    TURBT         Allergies:     Allergies   Allergen Reactions    Sulfa Antibiotics Hives       Medications:      Scheduled Meds: PRN Meds:    albuterol-ipratropium, 3 mL, Nebulization, QPM  allopurinol, 100 mg, Oral, Daily  atorvastatin, 40 mg, Oral, Daily  b complex-vitamin c-folic acid, 1 tablet, Oral, Daily  cinacalcet, 30 mg, Oral, Daily  clopidogrel, 75 mg, Oral, Daily  heparin (porcine), 5,000 Units, Subcutaneous, Q8H SCH  insulin glargine, 10 Units, Subcutaneous, QAM  insulin lispro, 1-3 Units, Subcutaneous, QHS  insulin lispro, 1-5 Units, Subcutaneous, TID AC  iron polysaccharides, 150 mg, Oral, Daily  metoprolol tartrate, 12.5 mg, Oral, Daily  montelukast, 10 mg, Oral, QHS  oxybutynin XL, 5 mg, Oral, Daily  pantoprazole, 40 mg, Oral, Q12H  piperacillin-tazobactam, 2.25 g, Intravenous, Q8H  prochlorperazine, 5 mg, Intravenous, Once  senna-docusate, 2 tablet, Oral, QHS  sevelamer carbonate, 1,600 mg, Oral, BID AC  umeclidinium-vilanterol, 1 puff, Inhalation, QAM        Continuous Infusions:   dianeal low calcium (2.5 mEq/L) 1.5% dextrose      dianeal low calcium (2.5 mEq/L) 1.5% dextrose      dianeal low calcium (2.5 mEq/L) 1.5% dextrose      acetaminophen, 650 mg, Q6H PRN  albuterol-ipratropium, 3 mL, Q6H PRN  benzocaine-menthol, 1 lozenge, Q2H PRN  benzonatate, 100 mg, TID PRN  carboxymethylcellulose sodium, 1 drop, TID PRN  dextrose, 15  g of glucose, PRN   Or  dextrose, 12.5 g, PRN   Or  dextrose, 12.5 g, PRN   Or  glucagon (rDNA), 1 mg, PRN  diphenhydrAMINE, 25 mg, QHS PRN  HYDROmorphone, 0.2 mg, Q4H PRN  melatonin, 3 mg, QHS PRN  naloxone, 0.2 mg, PRN  ondansetron, 4 mg, Q6H PRN   Or  ondansetron, 4 mg, Q6H PRN  oxyCODONE, 5 mg, Q6H PRN  polyethylene glycol, 17 g, Daily PRN  saline, 2 spray, Q4H PRN          Family History:     Family History   Problem Relation Age of Onset    Cancer Mother     Heart disease Father     Cancer Maternal Aunt     Diabetes Maternal Aunt     Cancer Paternal Aunt        Social History:     Social History     Socioeconomic History    Marital status: Married   Tobacco Use    Smoking status: Former    Smokeless tobacco: Never   Advertising account planner    Vaping status: Never Used   Substance and Sexual Activity    Alcohol use: No    Drug use: No     Social Determinants of Health     Food Insecurity: No Food Insecurity (05/26/2022)    Hunger Vital Sign     Worried About Running Out of Food in the Last Year: Never true     Ran Out of Food in the Last Year: Never true   Transportation Needs: No Transportation Needs (05/26/2022)    PRAPARE - Therapist, art (Medical): No     Lack of Transportation (Non-Medical): No    Received from [facility]    Social Network   Intimate Partner Violence: Not At Risk (05/26/2022)    Humiliation, Afraid, Rape, and Kick questionnaire     Fear of Current or Ex-Partner: No     Emotionally Abused: No     Physically Abused: No     Sexually Abused: No   Housing Stability: Unknown (05/26/2022)    Housing Stability Vital Sign     Unable to Pay for Housing in the Last Year: No     Unstable Housing in the Last Year: No       Review of Systems:   History obtained from chart review and the patient    Pertinent items are noted in HPI.    Physical Exam:     Vitals:    05/31/22 0903   BP:  Pulse:    Resp:    Temp:    SpO2: 97%     Temp (24hrs), Avg:97.6 F (36.4 C), Min:97.3 F (36.3 C), Max:98.2  F (36.8 C)      APPEARANCE: ill appearing elderly female in NAD.  NEURO: A+Ox3  HEART: RRR  LUNGS: Diminished at bases bilaterally  ABDOMEN: Soft, ND, TTP in lower abdomen just inferior to left abdominal PD catheter, +BS  EXTREMITIES: Warm and dry bilaterally, palpable DP pulses bilaterally, no edema.     Labs:     Results       Procedure Component Value Units Date/Time    Glucose Whole Blood - POCT [[id number]]  (Abnormal) Collected: 05/31/22 0736     Updated: 05/31/22 0745     Whole Blood Glucose POCT 167 mg/dL     Basic Metabolic Panel [[id number]]  (Abnormal) Collected: 05/31/22 0515    Specimen: Blood Updated: 05/31/22 0545     Glucose 185 mg/dL      BUN 78.2 mg/dL      Creatinine 95.6 mg/dL      Calcium 8.9 mg/dL      Sodium 213 mEq/L      Potassium 4.0 mEq/L      Chloride 97 mEq/L      CO2 24 mEq/L      Anion Gap 18.0     eGFR 2.4 mL/min/1.73 m2     CBC and differential [[id number]]  (Abnormal) Collected: 05/31/22 0515    Specimen: Blood Updated: 05/31/22 0533     WBC 12.42 x10 3/uL      Hgb 9.4 g/dL      Hematocrit 62.9 %      Platelets 251 x10 3/uL      RBC 3.08 x10 6/uL      MCV 96.1 fL      MCH 30.5 pg      MCHC 31.8 g/dL      RDW 15 %      MPV 9.7 fL      Instrument Absolute Neutrophil Count 7.52 x10 3/uL      Neutrophils 60.5 %      Lymphocytes Automated 25.0 %      Monocytes 12.5 %      Eosinophils Automated 0.1 %      Basophils Automated 0.6 %      Immature Granulocytes 1.3 %      Nucleated RBC 0.0 /100 WBC      Neutrophils Absolute 7.52 x10 3/uL      Lymphocytes Absolute Automated 3.11 x10 3/uL      Monocytes Absolute Automated 1.55 x10 3/uL      Eosinophils Absolute Automated 0.01 x10 3/uL      Basophils Absolute Automated 0.07 x10 3/uL      Immature Granulocytes Absolute 0.16 x10 3/uL      Absolute NRBC 0.00 x10 3/uL     Glucose Whole Blood - POCT [[id number]]  (Abnormal) Collected: 05/30/22 2218     Updated: 05/30/22 2229     Whole Blood Glucose POCT 193 mg/dL     Culture Blood Aerobic and Anaerobic  [[id number]] Collected: 05/26/22 1604    Specimen: Arm from Blood, Venipuncture Updated: 05/30/22 2021    Narrative:      ORDER#: [id number]                                    ORDERED BY: Caroline More  SOURCE: Blood, Venipuncture  Arm                      COLLECTED:  05/26/22 16:04  ANTIBIOTICS AT COLL.:                                RECEIVED :  05/26/22 19:56  Culture Blood Aerobic and Anaerobic        PRELIM      05/30/22 20:21  05/27/22   No Growth after 1 day/s of incubation.  05/28/22   No Growth after 2 day/s of incubation.  05/29/22   No Growth after 3 day/s of incubation.  05/30/22   No Growth after 4 day/s of incubation.      Culture Blood Aerobic and Anaerobic [[id number]] Collected: 05/26/22 1526    Specimen: Arm from Blood, Venipuncture Updated: 05/30/22 2021    Narrative:      ORDER#: [id number]                                    ORDERED BY: Caroline More  SOURCE: Blood, Venipuncture Arm                      COLLECTED:  05/26/22 15:26  ANTIBIOTICS AT COLL.:                                RECEIVED :  05/26/22 19:56  Culture Blood Aerobic and Anaerobic        PRELIM      05/30/22 20:21  05/27/22   No Growth after 1 day/s of incubation.  05/28/22   No Growth after 2 day/s of incubation.  05/29/22   No Growth after 3 day/s of incubation.  05/30/22   No Growth after 4 day/s of incubation.      Glucose Whole Blood - POCT [[id number]]  (Abnormal) Collected: 05/30/22 1529     Updated: 05/30/22 1552     Whole Blood Glucose POCT 225 mg/dL     Culture + Gram Stain,Aerobic, Body Fluid [[id number]] Collected: 05/27/22 1321    Specimen: Body Fluid from Peritoneal Fluid (Abdominal) Updated: 05/30/22 1501    Narrative:      ORDER#: [id number]                                    ORDERED BY: Georgiana Shore  SOURCE: Peritoneal Fluid (Abdominal) peritoneal fluidCOLLECTED:  05/27/22 13:21  ANTIBIOTICS AT COLL.:                                RECEIVED :  05/27/22 16:33  Stain, Gram                                FINAL        05/27/22 18:12  05/27/22   Few WBCs             No organisms seen             Stain performed on Cytospin (concentrated) specimen  Culture and Gram Stain, Aerobic, Body FluidPRELIM      05/30/22 15:01  05/28/22   Culture no growth to date. Final report to follow  05/29/22   Culture no growth to date. Final report to follow  05/30/22   Culture no growth to date. Final report to follow      Glucose Whole Blood - POCT [[id number]]  (Abnormal) Collected: 05/30/22 1103     Updated: 05/30/22 1114     Whole Blood Glucose POCT 173 mg/dL     Glucose Whole Blood - POCT [[id number]]  (Abnormal) Collected: 05/30/22 1005     Updated: 05/30/22 1009     Whole Blood Glucose POCT 156 mg/dL             Rads:     Radiology Results (24 Hour)       Procedure Component Value Units Date/Time    XR Chest AP Portable [[id number]] Collected: 05/30/22 1312    Order Status: Completed Updated: 05/30/22 1314    Narrative:      PORTABLE CHEST    HISTORY: Shortness of breath.         FINDINGS: The study is limited due to the patients body habitus. The lungs  are clear. The cardiomediastinal silhouette is unremarkable.          Impression:        No acute cardiopulmonary disease.        No change from XR CHEST AP ONLY with report dated 05/26/2022 4:03 PM.    [name], MD  05/30/2022 1:12 PM                  ASA Classification:   []    ASA 1  Healthy patient  []    ASA 2  Mild systemic illness  [x]    ASA 3  Systemic disease, though not incapacitating  []    ASA 4  Severe systemic disease that is a constant threat to life   []    ASA 5  Moribund condition, patient unexpected to live >24 hours, irrespective of                   procedure  []    E         Emergent procedure    Mallampati Score:   []  1  []  2  [x]  3  []  4  []  Intubated  []  Tracheostomy    Planned Anesthesia:   []  No sedation  [x]  Local  [x]  Moderate sedation  []  Deep sedation (with Anesthesiology present)  []  General Anesthesia     Airway Assesment:   [x]  Normal  [] Compromised    The patient is  medically stable and appropriate to undergo the planned procedure. All risks and alternatives were discussed with the patient and she agrees to proceed.    Signed by: Elease Etienne, NP  CVIR Department  [phone]       I have evaluated the patient and agree with the above documented evaluation and plan.    Signed by: Hope Pigeon, MD    [facility]  [facility], Division of Vascular & Interventional Radiology  [facility]- [phone]  [facility] - [phone]  IF[facility]--[id number] [facility]--8022 IF[facility]--3069  After Hours Service [phone]

## 2022-06-01 ENCOUNTER — Encounter: Payer: Self-pay | Admitting: Interventional Radiology and Diagnostic Radiology

## 2022-06-01 ENCOUNTER — Encounter
Admission: EM | Disposition: A | Discharge status: Home Health | Payer: Self-pay | Source: Home / Self Care | Attending: Pediatrics

## 2022-06-01 HISTORY — PX: TUNNELED CATH PLACEMENT (PERMCATH): IMG2664

## 2022-06-01 LAB — CBC AND DIFFERENTIAL
Absolute NRBC: 0.03 10*3/uL — ABNORMAL HIGH (ref 0.00–0.00)
Basophils Absolute Automated: 0.06 10*3/uL (ref 0.00–0.08)
Basophils Automated: 0.5 %
Eosinophils Absolute Automated: 0.01 10*3/uL (ref 0.00–0.44)
Eosinophils Automated: 0.1 %
Hematocrit: 29.4 % — ABNORMAL LOW (ref 34.7–43.7)
Hgb: 9.7 g/dL — ABNORMAL LOW (ref 11.4–14.8)
Immature Granulocytes Absolute: 0.15 10*3/uL — ABNORMAL HIGH (ref 0.00–0.07)
Immature Granulocytes: 1.2 %
Instrument Absolute Neutrophil Count: 7.99 10*3/uL — ABNORMAL HIGH (ref 1.10–6.33)
Lymphocytes Absolute Automated: 2.96 10*3/uL (ref 0.42–3.22)
Lymphocytes Automated: 23.5 %
MCH: 31.6 pg (ref 25.1–33.5)
MCHC: 33 g/dL (ref 31.5–35.8)
MCV: 95.8 fL (ref 78.0–96.0)
MPV: 9.7 fL (ref 8.9–12.5)
Monocytes Absolute Automated: 1.41 10*3/uL — ABNORMAL HIGH (ref 0.21–0.85)
Monocytes: 11.2 %
Neutrophils Absolute: 7.99 10*3/uL — ABNORMAL HIGH (ref 1.10–6.33)
Neutrophils: 63.5 %
Nucleated RBC: 0.2 /100 WBC — ABNORMAL HIGH (ref 0.0–0.0)
Platelets: 259 10*3/uL (ref 142–346)
RBC: 3.07 10*6/uL — ABNORMAL LOW (ref 3.90–5.10)
RDW: 15 % (ref 11–15)
WBC: 12.58 10*3/uL — ABNORMAL HIGH (ref 3.10–9.50)

## 2022-06-01 LAB — BASIC METABOLIC PANEL
Anion Gap: 17 — ABNORMAL HIGH (ref 5.0–15.0)
BUN: 48 mg/dL — ABNORMAL HIGH (ref 7.0–21.0)
CO2: 24 mEq/L (ref 17–29)
Calcium: 8.7 mg/dL (ref 7.9–10.2)
Chloride: 98 mEq/L — ABNORMAL LOW (ref 99–111)
Creatinine: 14.2 mg/dL — ABNORMAL HIGH (ref 0.4–1.0)
Glucose: 130 mg/dL — ABNORMAL HIGH (ref 70–100)
Potassium: 3.8 mEq/L (ref 3.5–5.3)
Sodium: 139 mEq/L (ref 135–145)
eGFR: 2.3 mL/min/{1.73_m2} — AB (ref >=60)

## 2022-06-01 LAB — HEPATITIS B CORE ANTIBODY, TOTAL: Hepatitis B Core Total Antibody: NONREACTIVE

## 2022-06-01 LAB — HEPATITIS B (HBV) SURFACE ANTIGEN WITH REFLEX TO CONFIRMATION: Hepatitis B Surface Antigen: NONREACTIVE

## 2022-06-01 LAB — MAGNESIUM: Magnesium: 2.2 mg/dL (ref 1.6–2.6)

## 2022-06-01 LAB — WHOLE BLOOD GLUCOSE POCT
Whole Blood Glucose POCT: 136 mg/dL — ABNORMAL HIGH (ref 70–100)
Whole Blood Glucose POCT: 163 mg/dL — ABNORMAL HIGH (ref 70–100)
Whole Blood Glucose POCT: 146 mg/dL — ABNORMAL HIGH (ref 70–100)

## 2022-06-01 LAB — PT AND APTT
PT INR: 1.1 (ref 0.9–1.1)
PT: 12.7 s (ref 10.1–12.9)
PTT: 27 s (ref 27–39)

## 2022-06-01 LAB — HEPATITIS B SURFACE (HBV) ANTIBODY, QUANTITATIVE: HEPATITIS B SURFACE ANTIBODY: 87.34 m[IU]/mL

## 2022-06-01 LAB — PHOSPHORUS: Phosphorus: 6.8 mg/dL — ABNORMAL HIGH (ref 2.3–4.7)

## 2022-06-01 SURGERY — TUNNELED CATH PLACEMENT
Anesthesia: IV Sedation

## 2022-06-01 MED ORDER — LIDOCAINE HCL 1 % IJ SOLN
INTRAMUSCULAR | Status: AC | PRN
Start: 2022-06-01 — End: 2022-06-01
  Administered 2022-06-01: 10 mL

## 2022-06-01 MED ORDER — MIDAZOLAM HCL 1 MG/ML IJ SOLN (WRAP)
0.5000 mg | INTRAMUSCULAR | Status: DC | PRN
Start: 2022-06-01 — End: 2022-06-01
  Administered 2022-06-01 (×4): 0.5 mg via INTRAVENOUS

## 2022-06-01 MED ORDER — RISAQUAD PO CAPS
1.0000 | ORAL_CAPSULE | Freq: Every day | ORAL | Status: DC
Start: 2022-06-01 — End: 2022-06-04
  Administered 2022-06-01 – 2022-06-04 (×4): 1 via ORAL
  Filled 2022-06-01 (×4): qty 1

## 2022-06-01 MED ORDER — SODIUM CHLORIDE 0.9 % IV BOLUS
100.0000 mL | INTRAVENOUS | Status: AC | PRN
Start: 2022-06-01 — End: 2022-06-01

## 2022-06-01 MED ORDER — HYOSCYAMINE SULFATE 0.125 MG PO TABS
0.1250 mg | ORAL_TABLET | ORAL | Status: DC | PRN
Start: 2022-06-01 — End: 2022-06-04
  Administered 2022-06-02: 0.125 mg via ORAL
  Filled 2022-06-01 (×8): qty 1

## 2022-06-01 MED ORDER — MIDAZOLAM HCL 1 MG/ML IJ SOLN (WRAP)
INTRAMUSCULAR | Status: AC
Start: 2022-06-01 — End: ?
  Filled 2022-06-01: qty 2

## 2022-06-01 MED ORDER — SODIUM CHLORIDE 0.9 % IV BOLUS
250.0000 mL | INTRAVENOUS | Status: AC | PRN
Start: 2022-06-01 — End: 2022-06-01

## 2022-06-01 MED ORDER — LIDOCAINE-EPINEPHRINE 2 %-1:200000 IJ SOLN
INTRAMUSCULAR | Status: AC
Start: 2022-06-01 — End: ?
  Filled 2022-06-01: qty 20

## 2022-06-01 MED ORDER — LIDOCAINE HCL 1 % IJ SOLN
INTRAMUSCULAR | Status: AC
Start: 2022-06-01 — End: ?
  Filled 2022-06-01: qty 10

## 2022-06-01 MED ORDER — DIPHENHYDRAMINE HCL 50 MG/ML IJ SOLN
25.0000 mg | Freq: Once | INTRAMUSCULAR | Status: DC | PRN
Start: 2022-06-01 — End: 2022-06-01

## 2022-06-01 MED ORDER — FENTANYL CITRATE (PF) 50 MCG/ML IJ SOLN (WRAP)
INTRAMUSCULAR | Status: AC
Start: 2022-06-01 — End: ?
  Filled 2022-06-01: qty 2

## 2022-06-01 MED ORDER — FENTANYL CITRATE (PF) 50 MCG/ML IJ SOLN (WRAP)
25.0000 ug | INTRAMUSCULAR | Status: DC | PRN
Start: 2022-06-01 — End: 2022-06-01
  Administered 2022-06-01 (×4): 25 ug via INTRAVENOUS

## 2022-06-01 MED ORDER — LEVOFLOXACIN 250 MG PO TABS
250.0000 mg | ORAL_TABLET | Freq: Every day | ORAL | Status: DC
Start: 2022-06-01 — End: 2022-06-02
  Administered 2022-06-01 – 2022-06-02 (×2): 250 mg via ORAL
  Filled 2022-06-01 (×2): qty 1

## 2022-06-01 MED ORDER — NALOXONE HCL 0.4 MG/ML IJ SOLN (WRAP)
0.1000 mg | INTRAMUSCULAR | Status: AC | PRN
Start: 2022-06-01 — End: 2022-06-01

## 2022-06-01 MED ORDER — ALBUMIN HUMAN 25 % IV SOLN
100.0000 mL | INTRAVENOUS | Status: AC | PRN
Start: 2022-06-01 — End: 2022-06-01

## 2022-06-01 MED ORDER — FLUMAZENIL 0.5 MG/5ML IV SOLN
0.2000 mg | INTRAVENOUS | Status: DC | PRN
Start: 2022-06-01 — End: 2022-06-01

## 2022-06-01 MED ORDER — ONDANSETRON HCL 4 MG/2ML IJ SOLN
4.0000 mg | Freq: Once | INTRAMUSCULAR | Status: DC
Start: 2022-06-01 — End: 2022-06-04
  Filled 2022-06-01 (×4): qty 2

## 2022-06-01 MED ORDER — LIDOCAINE-EPINEPHRINE 1 %-1:100000 IJ SOLN
INTRAMUSCULAR | Status: AC | PRN
Start: 2022-06-01 — End: 2022-06-01
  Administered 2022-06-01: 5 mL

## 2022-06-01 SURGICAL SUPPLY — 8 items
CATHETER HEMODIALYSIS OD14.5 FR L28 CM (Catheter) ×1 IMPLANT
CATHETER HEMODIALYSIS OD14.5 FR L28 CM EQUISTREAM L23 CM STRAIGHT (Catheter) IMPLANT
GUIDEWIRE ENDOSCOPIC AMPLATZ ULTRA STIFF OD.035 IN L80 CM L3 CM (Guidewire) IMPLANT
GUIDEWIRE ESCP SS PTFE 3CM STRG TPR (Guidewire) ×1 IMPLANT
GUIDEWIRE VASCULAR OD.035 IN L180 CM L3 (Guidewire) ×1 IMPLANT
GUIDEWIRE VASCULAR OD.035 IN L180 CM L3 CM GLIDEWIRE STANDARD ANGLE (Guidewire) IMPLANT
KIT MICROINTRODUCER L7 CM MAX COAXIAL (Introducer) ×1 IMPLANT
KIT MICROINTRODUCER L7 CM MAX COAXIAL STIFFEN GUIDEWIRE ECHOGENIC (Introducer) IMPLANT

## 2022-06-01 NOTE — Progress Notes (Signed)
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 - OP urology follow up for surgery in the future [facility] urology)      Recommendations:   Appreciate IR assistance  HD today  Will hold cinacalcet for now  Plan to set up OP HD at [facility] - appreciate CM assistance   Plan to maybe return to PD after surgery     Case discussed with: pt        Delice Bison, MD  [facility]  [id number] (office)      Subjective:   S/p [facility] placement today   Reports diarrhea    Review of Systems:   No cp      Physical Exam:     Vitals:    06/01/22 1005 06/01/22 1010 06/01/22 1044 06/01/22 1049   BP: 118/56 140/63  101/71   Pulse: 86 87  82   Resp: 18 20  16    Temp:    97.7 F (36.5 C)   TempSrc:    Oral   SpO2: 99% 98% 98% 96%   Weight:       Height:           Intake and Output Summary (Last 24 hours) at Date Time    Intake/Output Summary (Last 24 hours) at 06/01/2022 1420  Last data filed at 06/01/2022 1200  Gross per 24 hour   Intake 720 ml   Output --   Net 720 ml       General: awake, alert, no acute distress  Cardiovascular: regular rate and rhythm  Lungs: clear to auscultation bilaterally  Abdomen: soft, NT, ND  Extremities: no edema      Meds:      Scheduled Meds: PRN Meds:    allopurinol, 100 mg, Oral, Daily  atorvastatin, 40 mg, Oral, Daily  b complex-vitamin c-folic acid, 1 tablet, Oral, Daily  cinacalcet, 30 mg, Oral, Daily  clopidogrel, 75 mg, Oral, Daily  [START ON 06/02/2022] heparin (porcine), 5,000 Units, Subcutaneous, Q8H SCH  insulin glargine, 10 Units, Subcutaneous, QAM  insulin  lispro, 1-3 Units, Subcutaneous, QHS  insulin lispro, 1-5 Units, Subcutaneous, TID AC  iron polysaccharides, 150 mg, Oral, Daily  levoFLOXacin, 250 mg, Oral, Daily  metoprolol tartrate, 12.5 mg, Oral, Daily  montelukast, 10 mg, Oral, QHS  ondansetron, 4 mg, Intravenous, Once  ondansetron, 4 mg, Intravenous, Once  oxybutynin XL, 5 mg, Oral, Daily  pantoprazole, 40 mg, Oral, Q12H  prochlorperazine, 5 mg, Intravenous, Once  senna-docusate, 2 tablet, Oral, QHS  sevelamer carbonate, 1,600 mg, Oral, BID AC  umeclidinium-vilanterol, 1 puff, Inhalation, QAM          Continuous Infusions:   *HOLD MEDICATION FOR PROCEDURE*      *HOLD MEDICATION FOR PROCEDURE*, , Continuous PRN  acetaminophen, 650 mg, Q6H PRN  albumin human, 100 mL, PRN  albuterol-ipratropium, 3 mL, Q6H PRN  benzocaine-menthol, 1 lozenge, Q2H PRN  benzonatate, 100 mg, TID PRN  carboxymethylcellulose sodium, 1 drop, TID PRN  dextrose,  15 g of glucose, PRN   Or  dextrose, 12.5 g, PRN   Or  dextrose, 12.5 g, PRN   Or  glucagon (rDNA), 1 mg, PRN  diphenhydrAMINE, 25 mg, QHS PRN  HYDROmorphone, 0.2 mg, Q4H PRN  melatonin, 3 mg, QHS PRN  naloxone, 0.2 mg, PRN  ondansetron, 4 mg, Q6H PRN   Or  ondansetron, 4 mg, Q6H PRN  oxyCODONE, 5 mg, Q6H PRN  polyethylene glycol, 17 g, Daily PRN  saline, 2 spray, Q4H PRN  sodium chloride, 100 mL, Q1H PRN  sodium chloride, 250 mL, PRN              Labs:     Recent Labs   Lab 06/01/22  0315 05/31/22  0515 05/30/22  0453   WBC 12.58* 12.42* 11.60*   Hgb 9.7* 9.4* 8.8*   Hematocrit 29.4* 29.6* 27.5*   Platelets 259 251 241     Recent Labs   Lab 06/01/22  0315 05/31/22  0515 05/30/22  0453 05/28/22  0943 05/27/22  0431 05/26/22  1526   Sodium 139 139 139  More results in Results Review 138 138   Potassium 3.8 4.0 3.8  More results in Results Review 4.3 5.0   Chloride 98* 97* 98*  More results in Results Review 100 95*   CO2 24 24 24   More results in Results Review 23 25   BUN 48.0* 44.0* 50.0*  More results in Results Review 57.0*  53.0*   Creatinine 14.2* 13.6* 14.3*  More results in Results Review 14.4* 13.6*   Calcium 8.7 8.9 8.7  More results in Results Review 9.5 10.5*   Albumin  --   --   --   --   --  3.2*   Phosphorus 6.8*  --  6.2*  --  6.7*  --    Magnesium 2.2  --  2.3  --  2.5  --    Glucose 130* 185* 111*  More results in Results Review 167* 98   eGFR 2.3* 2.4* 2.3*  More results in Results Review 2.3* 2.4*   More results in Results Review = values in this interval not displayed.       Recent Labs   Lab 05/26/22  2234   Urine Type Urine, Catheriz   Color, UA Orange*   Clarity, UA Turbid*   Specific Gravity UA 1.020   Urine pH 6.5   Nitrite, UA Negative   Ketones UA Negative   Urobilinogen, UA Normal   Bilirubin, UA Negative   Blood, UA Moderate*   RBC, UA TNTC*   WBC, UA TNTC*           Tunneled Cath Placement (Permcath)    Result Date: 06/01/2022   Technically successful placement of a 23 cm  permcath via the right internal jugular vein with tip in the right atrium. Catheter ready for use. Suszanne Finch, MD 06/01/2022 10:31 AM         Signed by: Delice Bison, MD

## 2022-06-01 NOTE — PT Progress Note (Signed)
Physical Therapy Note    [facility]  Physical Therapy Attempt Note    Patient:  Shelby Barron MRN#:  [id number]  Unit:  [facility] 4 NORTH Room/Bed:  A4027/A4027.01      Physical Therapy treatment attempted on 06/01/2022 at 3:41 PM.     PT Visit Cancellation Reason: Testing/Procedure ; pt off unit for dialysis        Physical therapy will follow up at a later time/date as able.     RN aware.     Phill Mutter, PT [phone])    Physical Medicine and Rehabilitation  [facility]    06/01/2022  3:43 PM

## 2022-06-01 NOTE — Progress Notes (Signed)
HISTORY & PHYSICAL    History and Physical:     The patient was interviewed and examined in the preoperative holding area. Patient presents for tunneled HD catheter placement to transition from PD to HD due to malpositioned PD catheter becoming dysfunctional. Patient currently resting in bed in no distress. No complaints at this time. The previously documented history and physical on 05/31/22 was reviewed in detail there are no significant changes on exam.    ASA Classification:   []    ASA 1  Healthy patient  []    ASA 2  Mild systemic illness  [x]    ASA 3  Systemic disease, though not incapacitating  []    ASA 4  Severe systemic disease that is a constant threat to life   []    ASA 5  Moribund condition, patient unexpected to live >24 hours, irrespective of procedure  []    E         Emergent procedure    Mallampati Score:   []  1  []  2  [x]  3  []  4  []  Intubated  []  Tracheostomy    Planned Anesthesia:   []  No sedation  [x]  Local  [x]  Moderate sedation  []  Deep sedation (with Anesthesiology present)  []  General Anesthesia     Airway Assesment:   [x]  Normal  [] Compromised    Diagnosis: ESRD  Procedure: Tunneled HD cathter placement    The patient is medically stable and appropriate to undergo the planned procedure. All risks and alternatives were discussed with the patient and she agrees to proceed.       Marcha Dutton, NP  CVIR Department  [facility]  [phone]       I have evaluated the patient and agree with the above documented evaluation and plan.    Signed by: Verlee Rossetti, MD    [facility]  [facility], Division of Vascular & Interventional Radiology  [phone]  [facility] - [phone]  [facility]--3570 IL[facility]--8022 IFOH--3069  After Hours Service [phone]

## 2022-06-01 NOTE — Plan of Care (Signed)
Pt alert and oriented and able to make needs known. Pain medications given as ordered with pain relief verbalized. Pt breathing easy on RA. VSS. All belongings within reach of pt. Peritoneal cath in place. Care plans reviewed with pt. Verbalized understanding. NAD. Will continue to monitor with hourly rounding and treat as needed.    Problem: Pain interferes with ability to perform ADL  Goal: Pain at adequate level as identified by patient  Outcome: Progressing  Flowsheets (Taken 06/01/2022 0128)  Pain at adequate level as identified by patient:   Identify patient comfort function goal   Assess for risk of opioid induced respiratory depression, including snoring/sleep apnea. Alert healthcare team of risk factors identified.   Assess pain on admission, during daily assessment and/or before any "as needed" intervention(s)   Evaluate if patient comfort function goal is met   Evaluate patient's satisfaction with pain management progress   Offer non-pharmacological pain management interventions   Include patient/patient care companion in decisions related to pain management as needed     Problem: Side Effects from Pain Analgesia  Goal: Patient will experience minimal side effects of analgesic therapy  Outcome: Progressing  Flowsheets (Taken 06/01/2022 0128)  Patient will experience minimal side effects of analgesic therapy:   Assess for changes in cognitive function   Evaluate for opioid-induced sedation with appropriate assessment tool (i.e. POSS)   Monitor/assess patient's respiratory status (RR depth, effort, breath sounds)     Problem: Moderate/High Fall Risk Score >5  Goal: Patient will remain free of falls  Outcome: Progressing     Problem: Patient Receiving Advanced Renal Therapies  Goal: Therapy access site remains intact  Outcome: Progressing     Problem: Compromised Activity/Mobility  Goal: Activity/Mobility Interventions  Outcome: Progressing  Flowsheets (Taken 05/31/2022 2016)  Activity/Mobility Interventions:  Pad bony prominences, TAP Seated positioning system when OOB, Promote PMP, Reposition q 2 hrs / turn clock, Offload heels     Problem: Fluid and Electrolyte Imbalance/ Endocrine  Goal: Fluid and electrolyte balance are achieved/maintained  Outcome: Progressing  Flowsheets (Taken 06/01/2022 0128)  Fluid and electrolyte balance are achieved/maintained:   Monitor/assess lab values and report abnormal values   Monitor for muscle weakness   Assess and reassess fluid and electrolyte status   Observe for cardiac arrhythmias  Goal: Adequate hydration  Outcome: Progressing  Flowsheets (Taken 06/01/2022 0128)  Adequate hydration:   Assess mucus membranes, skin color, turgor, perfusion and presence of edema   Monitor and assess vital signs and perfusion   Assess for peripheral, sacral, periorbital and abdominal edema     Problem: Compromised Sensory Perception  Goal: Sensory Perception Interventions  Outcome: Progressing     Problem: Compromised Moisture  Goal: Moisture level Interventions  Outcome: Progressing     Problem: Compromised Friction/Shear  Goal: Friction and Shear Interventions  Outcome: Progressing

## 2022-06-01 NOTE — Progress Notes (Signed)
Office: [phone]  Epic GroupChat: "[facility])"    Date Time: 06/01/22 @NOW   Patient Name: Shelby Barron, Shelby Barron      Problem List:      Admission with abd pain  Recently recognized bladder cancer with extension to ureter  Pyuria/ hematuria              -- recent dx of "UTI" treated with levaquin    -- outpt ucx grew "staph species", not aureus              -- prior h/o enterococcal UTI (march 2024)                          -- amp sensitive   -- Ucx 5/8 10-30,000 urogenital flora. But after abx   -- bcx NGTD     PD fluid clear  -------------------------------------  Chronic conditions  Diabetes  Hypertension  H/O CVA  ESRD, on peritoneal dialysis  Prior h/o renal cell CA on left  -- s/p partial L nephrectomy  Gout  COPD  Allergy to sulfa      Interval Events/Subjective:   NAEO   Afebrile   WBCs 12    Antimicrobials:     #4 zosyn 5/11-> stop  #1 levaquin 250mg  daily 5/14-     Prior  Vanc therapeutic 5/8-5/11 at least  Cefepime 1 gram IV daily #4     Estimated Creatinine Clearance: 3.2 mL/min (A) (based on SCr of 14.2 mg/dL (H)).      Assessment:     Elderly lady with bladder cancer with extension into ureters with pyuria/ hematuria. No fever but has leucocytosis. It may be that the active urine sediment may be due to tumor but she is at risk for infection and has had recent abx, which may not make the current cx acurate.     She does have a prior h/o enterococcus in the urine in 03/2022. Also grew "staph species", not aureus, in her outpatient urine (discussed with Dr. Thad Ranger), sensitive to bactrim and levaquin, just prior to admission. So will switch to levaquin for 3 more days.     With this will have received a total of 7d for each of her prior enterococcus (vanc -> zosyn) and the CONS (vanc-> levaquin). Clinical significance of these bacterial cx remains in question, unclear if she has true UTI, as her pyuria may be due to her malignancy.     Plan:     Switch to levaquin  for 3 more days, EOT 5/16    Monitoring clinical response and untoward effects of high risk/IV antimicrobials    Lines:     Patient Lines/Drains/Airways Status       Active PICC Line / CVC Line / PIV Line / Drain / Airway / Intraosseous Line / Epidural Line / ART Line / Line / Wound / Pressure Ulcer / NG/OG Tube       Name Placement date Placement time Site Days    Peripheral IV 05/29/22 20 G Right Antecubital 05/29/22  1620  Antecubital  1    Peritoneal Dialysis Catheter Mid lower abdomen 05/26/22  2000  -- 4                    *I have performed a risk-benefit analysis and the patient needs a central line for access and IV medications      Review of Systems:   General ROS: negative for -  chills, fevers, night sweats, weight loss   HEENT: negative for - blurry vision, sore throat, thrush   Respiratory ROS: negative for cough, SOB  Cardiovascular ROS: negative for - chest pain, palpitations   Gastrointestinal ROS: negative for - abdominal pain, nausea, vomiting, diarrhea  Genito-Urinary ROS: negative for - dysuria, urinary frequency/urgency   Musculoskeletal ROS: negative for - joint pain, joint stiffness or muscle pain   Dermatological ROS: negative for - rash and skin lesion changes   Neurological ROS: negative for - confusion, headache, dizziness  Hematological ROS: negative for - bruising, bleeding   Psychological ROS: negative for - changes in mood    Physical Exam:     Vitals:    06/01/22 1049   BP: 101/71   Pulse: 82   Resp: 16   Temp: 97.7 F (36.5 C)   SpO2: 96%       Awake, alert, no distress, cooperative.    HENT normal. Mouth moist, face symmetric  Lungs coarse breath sounds, without rales, wheezing  Cv normal without murmur, rub, or gallop  Abdomen soft nontender, PD cath site not inflamed  Extrem normal without phlebitis or edema.   No rash.   Neurologic exam non-focal.   Normal mental status.         Family History:     Family History   Problem Relation Age of Onset    Cancer Mother     Heart disease  Father     Cancer Maternal Aunt     Diabetes Maternal Aunt     Cancer Paternal Aunt        Social History:     Social History     Socioeconomic History    Marital status: Married     Spouse name: Not on file    Number of children: Not on file    Years of education: Not on file    Highest education level: Not on file   Occupational History    Not on file   Tobacco Use    Smoking status: Former    Smokeless tobacco: Never   Vaping Use    Vaping status: Never Used   Substance and Sexual Activity    Alcohol use: No    Drug use: No    Sexual activity: Not on file   Other Topics Concern    Not on file   Social History Narrative    Not on file     Social Determinants of Health     Financial Resource Strain: Not on file   Food Insecurity: No Food Insecurity (05/26/2022)    Hunger Vital Sign     Worried About Running Out of Food in the Last Year: Never true     Ran Out of Food in the Last Year: Never true   Transportation Needs: No Transportation Needs (05/26/2022)    PRAPARE - Therapist, art (Medical): No     Lack of Transportation (Non-Medical): No   Physical Activity: Not on file   Stress: Not on file   Social Connections: Unknown (02/01/2022)    Received from [facility]    Social Network     Social Network: Not on file   Intimate Partner Violence: Not At Risk (05/26/2022)    Humiliation, Afraid, Rape, and Kick questionnaire     Fear of Current or Ex-Partner: No     Emotionally Abused: No     Physically Abused: No     Sexually Abused:  No   Housing Stability: Unknown (05/26/2022)    Housing Stability Vital Sign     Unable to Pay for Housing in the Last Year: No     Number of Places Lived in the Last Year: Not on file     Unstable Housing in the Last Year: No       Allergies:     Allergies   Allergen Reactions    Sulfa Antibiotics Hives       Labs:     Lab Results   Component Value Date    WBC 12.58 (H) 06/01/2022    HGB 9.7 (L) 06/01/2022    HCT 29.4 (L) 06/01/2022    MCV 95.8 06/01/2022    PLT 259  06/01/2022     Lab Results   Component Value Date    CREAT 14.2 (H) 06/01/2022     Lab Results   Component Value Date    ALT 13 05/26/2022    AST 18 05/26/2022    ALKPHOS 78 05/26/2022    BILITOTAL 0.6 05/26/2022     Lab Results   Component Value Date    LACTATE 2.0 05/26/2022       Microbiology:     Microbiology Results (last 15 days)       Procedure Component Value Units Date/Time    Culture + Gram Stain,Aerobic, Body Fluid [[id number]] Collected: 05/27/22 1321    Order Status: Completed Specimen: Body Fluid from Peritoneal Fluid (Abdominal) Updated: 05/31/22 1301    Narrative:      ORDER#: [id number]                                    ORDERED BY: Georgiana Shore  SOURCE: Peritoneal Fluid (Abdominal) peritoneal fluidCOLLECTED:  05/27/22 13:21  ANTIBIOTICS AT COLL.:                                RECEIVED :  05/27/22 16:33  Stain, Gram                                FINAL       05/27/22 18:12  05/27/22   Few WBCs             No organisms seen             Stain performed on Cytospin (concentrated) specimen  Culture and Gram Stain, Aerobic, Body FluidFINAL       05/31/22 13:01  05/31/22   No growth      Urine culture [[id number]] Collected: 05/26/22 2234    Order Status: Completed Specimen: Bladder Updated: 05/28/22 1742    Narrative:      ORDER#: [id number]                                    ORDERED BY: Leigh Aurora, JODY  SOURCE: Urine                                        COLLECTED:  05/26/22 22:34  ANTIBIOTICS AT COLL.:  RECEIVED :  05/27/22 14:13  Culture Urine                              FINAL       05/28/22 17:42  05/28/22   10,000 - 30,000 CFU/MLof normal urogenital or skin microbiota, no             further work      Culture + Gram Stain,Aerobic, Body Fluid [[id number]]     Order Status: Canceled Specimen: Body Fluid from Peritoneal Fluid (Abdominal)     Culture, Anaerobic Bacteria [[id number]]     Order Status: Sent Specimen: Other from Peritoneal Fluid (Abdominal)     Culture Blood  Aerobic and Anaerobic [[id number]] Collected: 05/26/22 1604    Order Status: Completed Specimen: Arm from Blood, Venipuncture Updated: 05/31/22 2221    Narrative:      ORDER#: [id number]                                    ORDERED BY: Caroline More  SOURCE: Blood, Venipuncture Arm                      COLLECTED:  05/26/22 16:04  ANTIBIOTICS AT COLL.:                                RECEIVED :  05/26/22 19:56  Culture Blood Aerobic and Anaerobic        FINAL       05/31/22 22:21  05/31/22   No growth after 5 days of incubation.      COVID-19 (SARS-CoV-2) and Influenza A/B, NAA (Liat Rapid) [[id number]] Collected: 05/26/22 1535    Order Status: Completed Specimen: Culturette from Nasopharyngeal Updated: 05/26/22 1601     Purpose of COVID testing Diagnostic -PUI     SARS-CoV-2 Specimen Source Nasal Swab     SARS CoV 2 Overall Result Not Detected     Comment: __________________________________________________  -A result of "Detected" indicates POSITIVE for the    presence of SARS CoV-2 RNA  -A result of "Not Detected" indicates NEGATIVE for the    presence of SARS CoV-2 RNA  __________________________________________________________  Test performed using the Roche cobas Liat SARS-CoV-2 assay. This assay is  only for use under the Food and Drug Administration's Emergency Use  Authorization. This is a real-time RT-PCR assay for the qualitative  detection of SARS-CoV-2 RNA. Viral nucleic acids may persist in vivo,  independent of viability. Detection of viral nucleic acid does not imply the  presence of infectious virus, or that virus nucleic acid is the cause of  clinical symptoms. Negative results do not preclude SARS-CoV-2 infection and  should not be used as the sole basis for diagnosis, treatment or other  patient management decisions. Negative results must be combined with  clinical observations, patient history, and/or epidemiological information.  Invalid results may be due to inhibiting substances in the specimen  and  recollection should occur. Please see Fact Sheets for patients and providers  located:  WirelessDSLBlog.no          Influenza A Not Detected     Influenza B Not Detected     Comment: Test performed using the Roche cobas Liat SARS-CoV-2 & Influenza A/B assay.  This assay is only for use under the  Food and Drug Administration's  Emergency Use Authorization. This is a multiplex real-time RT-PCR assay  intended for the simultaneous in vitro qualitative detection and  differentiation of SARS-CoV-2, influenza A, and influenza B virus RNA. Viral  nucleic acids may persist in vivo, independent of viability. Detection of  viral nucleic acid does not imply the presence of infectious virus, or that  virus nucleic acid is the cause of clinical symptoms. Negative results do  not preclude SARS-CoV-2, influenza A, and/or influenza B infection and  should not be used as the sole basis for diagnosis, treatment or other  patient management decisions. Negative results must be combined with  clinical observations, patient history, and/or epidemiological information.  Invalid results may be due to inhibiting substances in the specimen and  recollection should occur. Please see Fact Sheets for patients and providers  located: http://www.rice.biz/.         Narrative:      o Collect and clearly label specimen type:  o PREFERRED-Upper respiratory specimen: One Nasal Swab in  Transport Media.  o Hand deliver to laboratory ASAP  Diagnostic -PUI    Culture Blood Aerobic and Anaerobic [[id number]] Collected: 05/26/22 1526    Order Status: Completed Specimen: Arm from Blood, Venipuncture Updated: 05/31/22 2221    Narrative:      ORDER#: [id number]                                    ORDERED BY: Caroline More  SOURCE: Blood, Venipuncture Arm                      COLLECTED:  05/26/22 15:26  ANTIBIOTICS AT COLL.:                                RECEIVED :  05/26/22 19:56  Culture Blood Aerobic and  Anaerobic        FINAL       05/31/22 22:21  05/31/22   No growth after 5 days of incubation.      Culture Blood Aerobic and Anaerobic [[id number]]     Order Status: Canceled Specimen: Arm from Blood, Venipuncture             Rads:   Tunneled Cath Placement (Permcath)    Result Date: 06/01/2022   Technically successful placement of a 23 cm  permcath via the right internal jugular vein with tip in the right atrium. Catheter ready for use. Suszanne Finch, MD 06/01/2022 10:31 AM    Drain (Other)    Result Date: 05/31/2022   Unsuccessful attempt at repositioning the left sided PD catheter into the pelvis. Dimitrios Papadouris, MD 05/31/2022 4:58 PM     Thank you for this interesting consult. During the week, please reach out to author of the most recent ID note.     Signed by: Armen Pickup, MD

## 2022-06-01 NOTE — Consults (Signed)
CM received report from medical team for possible D/C today or tomorrow. DCP: Home with OP HD. Pt is newly OP HD. CM notified Gypsy Decant of D/C plan via secure Epic chat-greatly appreciated the assistance.     Natasha Bence, RN, BSN, CM  RN Care Manager I  [facility]  [phone]

## 2022-06-01 NOTE — Plan of Care (Signed)
Problem: Pain interferes with ability to perform ADL  Goal: Pain at adequate level as identified by patient  Outcome: Progressing  Flowsheets (Taken 06/01/2022 1520)  Pain at adequate level as identified by patient:   Identify patient comfort function goal   Assess pain on admission, during daily assessment and/or before any "as needed" intervention(s)   Assess for risk of opioid induced respiratory depression, including snoring/sleep apnea. Alert healthcare team of risk factors identified.   Reassess pain within 30-60 minutes of any procedure/intervention, per Pain Assessment, Intervention, Reassessment (AIR) Cycle   Evaluate if patient comfort function goal is met   Include patient/patient care companion in decisions related to pain management as needed   Consult/collaborate with Pain Service   Consult/collaborate with Physical Therapy, Occupational Therapy, and/or Speech Therapy   Evaluate patient's satisfaction with pain management progress   Offer non-pharmacological pain management interventions     Problem: Patient Receiving Advanced Renal Therapies  Goal: Therapy access site remains intact  Outcome: Progressing  Flowsheets (Taken 06/01/2022 1520)  Therapy access site remains intact:   Assess therapy access site   Change therapy access site dressing as needed     Problem: Fluid and Electrolyte Imbalance/ Endocrine  Goal: Fluid and electrolyte balance are achieved/maintained  Outcome: Progressing  Flowsheets (Taken 06/01/2022 1520)  Fluid and electrolyte balance are achieved/maintained:   Monitor for muscle weakness   Monitor/assess lab values and report abnormal values   Assess and reassess fluid and electrolyte status   Observe for cardiac arrhythmias     Problem: Fluid and Electrolyte Imbalance/ Endocrine  Goal: Adequate hydration  Outcome: Progressing  Flowsheets (Taken 06/01/2022 1520)  Adequate hydration:   Assess mucus membranes, skin color, turgor, perfusion and presence of edema   Monitor and assess  vital signs and perfusion   Assess for peripheral, sacral, periorbital and abdominal edema   Pt A/ox4 follows commands denied pain or discomfort oob to BR with assistance pt RT [place] cath in placed today . Call bell with in reach pt had BM per pt report lose stool . Call bell with in reach pt safety and comfort will continue monitor .

## 2022-06-01 NOTE — Respiratory Progress Note (Signed)
Respiratory Therapy                              Patient Re-Assessment Note/Protocol Order Changes    Patient has been assessed and re-evaluated for the follow therapies:       Respiratory Orders   (From admission, onward)                 Start     Ordered    05/31/22 2000  Nebulizer treatment intermittent  Every evening (RT)      Comments:   All Adult patients ordered for Respiratory Therapy, i.e., inhaled meds, secretion clearance/lung expansion or Oxygen greater than 5 liters/min will be evaluated by a Respiratory Therapist and assessed per Respiratory Therapy Patient Driven Protocol.  Initial assessment and changes made per protocol can be found in the progress note section of the patient chart.   Question:  RESPIRATORY PATIENT DRIVEN PROTOCOL  Answer:  YES    05/31/22 0332    05/27/22 0900  MDI/DPI  Every morning (RT)      Comments:   All Adult patients ordered for Respiratory Therapy, i.e., inhaled meds, secretion clearance/lung expansion or Oxygen greater than 5 liters/min will be evaluated by a Respiratory Therapist and assessed per Respiratory Therapy Patient Driven Protocol.  Initial assessment and changes made per protocol can be found in the progress note section of the patient chart.    05/27/22 0629                  IP Meds - Nasal and Inhaled (From admission, onward)      Start     Stop Status Route Frequency Ordered    05/31/22 2000  albuterol-ipratropium (DUO-NEB) 2.5-0.5(3) mg/3 mL nebulizer 3 mL         -- Dispensed NEBULIZATION RT - Every evening 05/31/22 0331    05/31/22 0331  albuterol-ipratropium (DUO-NEB) 2.5-0.5(3) mg/3 mL nebulizer 3 mL         -- Verified NEBULIZATION RT - Every 6 hours as needed 05/31/22 0331    05/27/22 0900  umeclidinium-vilanterol (ANORO ELLIPTA) 62.5-25 MCG/ACT 1 puff         -- Dispensed IN RT - Every morning 05/26/22 2055                 Current Criteria For Therapy        Medications: Bronchospasms or  documented bronchospasms in the last 24 hrs    Expected Outcomes               Outcomes Met                Reassessment Recommendations  Recommendations: Decrease current treatment plan    Patient's orders have been modified as follows per RT Patient Driven Protocol.    <free text>    If any questions, please contact the Respiratory Therapist assigned to this patient    Thank you

## 2022-06-01 NOTE — Progress Notes (Signed)
[facility]  PROGRESS NOTE      Patient: Shelby Barron  Date: 06/01/2022   LOS: 6 Days  Admission Date: 05/26/2022   MRN: [id number]  Attending: Gayleen Orem, MD  When on service as the attending, please contact me on Epic Secure Chat from 7 AM - 7 PM for non-urgent issues. For urgent matters use XTend page from 7 AM - 7 PM.       ASSESSMENT/PLAN     Shelby Barron is a 82 y.o. female admitted with Peritonitis    Interval Summary: 82 y.o. 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   Patient was sent to the ER by her nephrologist for dysuria and suprapubic pain peristent despite 5d course of Levaquin.  CT showing new finding of grade 3 pronounced right -sided hydronephrosis and diffuse ureterectasis to the level of the right UVJ and several small masses of the right kidney. Urology consulted and did not recommend any acute intervention this admission for worsening hydro, just continue treatment with empiric antibiotics for UTI.         Patient Active Hospital Problem List:  Sepsis 2/2 UTI resolved   - lactic normal, leukocytosis improving   -Blood culture and urine culture remain negative  -Given patient failed outpatient Levaquin infectious disease consulted recommended to continue Zosyn over the weekend  -ID recommended discharging patient with Augmentin to complete 7-day course course of antibiotics.   - antibiotic changed to levquin on  5/14    High grade urothelioma bladder CA   Obstructive uropathy with grade 3 hydronephrosis  RCC s/p partial nephrectomy    - Followed by Dr Celine Mans outpatient with plans for cystectomy and nephrectomy   - Urology consulted, since no pyelo on imaging likely nothing to do for worsening hyrdo this admission   - CTAP with grade 3 R sided  hydronephrosis and ureterectasis to the R UVJ. Several small masses of the R kidney which may be solid masses vs complex cysts.  - pain controlled    Malfunctioning peritoneal dialysis portal  At this point peritoneal dialysis fairly working per nephrology and recommend to continue  Ordered abdominal x-ray to see if it is kinked if not possible manipulation by IR as an outpatient.  Status post IR evaluation of the peritoneum post done on 05/31/2022 which is unsuccessful recommended surgical evaluation, nephrology planning to switch to hemodialysis  S/p tunneled dialysis cath done on 5/14    COPD   - stable, chest x-ray unremarkable   - resume albuterol PRN   - resume anoro ellipta & singulair per home dose     HTN  HLD  H/o CVA  Continue metoprolol/atorvastatin     ESRD on PD  HAGMA 2/2 ESRD   - nephrology consulted, continue PD as ordered   - resume nephrovite, sensipar, renvela   -tunneled cath was placed on 5/14 because of the PD cath dysfunction   - pending clearance from nephro and dialysis seat arrangement.     DMII   - continue reduced dose lantus 10 U daily   - low dose SSI   - carb consistent diet   - A1C 8.5     GERD   - Continue PPI     Gout   -  Continue allopurinol     Iron deficient anemia  Anemia of chronic disease   - resume iron supplement   - follow h/h     - hold ESA given ca        Nutrition: renal diet          Patient has BMI=Body mass index is 33.91 kg/m.  Diagnosis: Obesity based on BMI criteria                   Recent Labs   Lab 06/01/22  0315 05/31/22  0515 05/30/22  0453   Hgb 9.7* 9.4* 8.8*   Hematocrit 29.4* 29.6* 27.5*   MCV 95.8 96.1* 95.2   WBC 12.58* 12.42* 11.60*   Platelets 259 251 241         Anemia Diagnosis: Chronic: Anemia of Chronic Kidney Disease    Code Status: Full Code    Dispo: Pending hemodialysis catheter placement  Family Contact: Updated daughter over the phone  DVT Prophylaxis:   Current Facility-Administered Medications (Includes Only Anticoagulants, Misc.  Hematological)   Medication Dose Route Last Admin    albumin human 25 % bag 25 g  100 mL Intravenous      clopidogrel (PLAVIX) tablet 75 mg  75 mg Oral 75 mg at 05/31/22 0811    [START ON 06/02/2022] heparin (porcine) injection 5,000 Units  5,000 Units Subcutaneous            CHART  REVIEW & DISCUSSION     The following chart items were reviewed as of 2:53 PM on 06/01/22:  [x]  Lab Results [x]  Imaging Results   []  Problem List  [x]  Current Orders [x]  Current Medications  []  Allergies  [x]  Code Status [x]  Previous Notes   []  SDoH    The management and plan of care for this patient was discussed with the following specialty consultants:  []  Cardiology  []  Gastroenterology                 [x]  Infectious Disease  []  Pulmonology []  Neurology                [x]  Nephrology  []  Neurosurgery []  Orthopedic Surgery  []  Heme/Onc  []  General Surgery []  Psychiatry                                   [x]  Urology    SUBJECTIVE     [name]   No events reported overnight  Reported diarrhea.   Denies chest pain, lightheadedness, nausea,    MEDICATIONS     Current Facility-Administered Medications   Medication Dose Route Frequency    allopurinol  100 mg Oral Daily    atorvastatin  40 mg Oral Daily    b complex-vitamin c-folic acid  1 tablet Oral Daily    cinacalcet  30 mg Oral Daily    clopidogrel  75 mg Oral Daily    [START ON 06/02/2022] heparin (porcine)  5,000 Units Subcutaneous Q8H SCH    insulin glargine  10 Units Subcutaneous QAM    insulin lispro  1-3 Units Subcutaneous QHS    insulin lispro  1-5 Units Subcutaneous TID AC    iron polysaccharides  150 mg Oral Daily    lactobacillus/streptococcus  1 capsule Oral Daily    levoFLOXacin  250 mg Oral Daily    metoprolol tartrate  12.5 mg Oral Daily    montelukast  10 mg Oral QHS    ondansetron  4 mg Intravenous Once    ondansetron  4 mg Intravenous Once    oxybutynin XL  5 mg Oral Daily    pantoprazole  40 mg Oral Q12H    prochlorperazine  5 mg Intravenous Once    sevelamer  carbonate  1,600 mg Oral BID AC    umeclidinium-vilanterol  1 puff Inhalation QAM       PHYSICAL EXAM     Vitals:    06/01/22 1421   BP: 106/62   Pulse: (!) 103   Resp: 16   Temp: 98.4 F (36.9 C)   SpO2: 98%       Temperature: Temp  Min: 97.5 F (36.4 C)  Max: 98.4 F (36.9 C)  Pulse: Pulse  Min: 69  Max: 103  Respiratory: Resp  Min: 15  Max: 22  Non-Invasive BP: BP  Min: 101/71  Max: 162/68  Pulse Oximetry SpO2  Min: 95 %  Max: 100 %    Intake and Output Summary (Last 24 hours) at Date Time    Intake/Output Summary (Last 24 hours) at 06/01/2022 1453  Last data filed at 06/01/2022 1200  Gross per 24 hour   Intake 720 ml   Output --   Net 720 ml             GEN APPEARANCE: Normal;  A&OX3  HEENT: PERLA; EOMI; Conjunctiva Clear  NECK: Supple; No bruits  CVS: RRR, S1, S2; No M/G/R  LUNGS: CTAB; No Wheezes; No Rhonchi: No rales  ABD: Soft; No TTP; + Normoactive BS  EXT: No edema; Pulses 2+ and intact  NEURO: CN 2-12 intact; No Focal neurological deficits  MENTAL STATUS: Conversant and cooperative    LABS     Recent Labs   Lab 06/01/22  0315 05/31/22  0515 05/30/22  0453   WBC 12.58* 12.42* 11.60*   RBC 3.07* 3.08* 2.89*   Hgb 9.7* 9.4* 8.8*   Hematocrit 29.4* 29.6* 27.5*   MCV 95.8 96.1* 95.2   Platelets 259 251 241       Recent Labs   Lab 06/01/22  0315 05/31/22  0515 05/30/22  0453 05/28/22  0943 05/27/22  0431   Sodium 139 139 139 138 138   Potassium 3.8 4.0 3.8 4.5 4.3   Chloride 98* 97* 98* 98* 100   CO2 24 24 24 23 23    BUN 48.0* 44.0* 50.0* 56.0* 57.0*   Creatinine 14.2* 13.6* 14.3* 14.8* 14.4*   Glucose 130* 185* 111* 185* 167*   Calcium 8.7 8.9 8.7 9.3 9.5   Magnesium 2.2  --  2.3  --  2.5       Recent Labs   Lab 05/26/22  1526   ALT 13   AST (SGOT) 18   Bilirubin, Total 0.6   Albumin 3.2*   Alkaline Phosphatase 78       Recent Labs   Lab 05/26/22  1837 05/26/22  1526   hs Troponin-I 9.6 11.8   hs Troponin-I Delta -2.2  --        Recent Labs   Lab 06/01/22  0315   PT INR 1.1   PT 12.7   PTT 27        Microbiology Results (last 15 days)       Procedure Component Value Units Date/Time    Culture + Gram Azzie Glatter Body Fluid [[id number]] Collected: 05/27/22 1321    Order Status: Completed Specimen: Body Fluid from  Peritoneal Fluid (Abdominal) Updated: 05/31/22 1301    Narrative:      ORDER#: [id number]                                    ORDERED BY: Georgiana Shore  SOURCE: Peritoneal Fluid (Abdominal) peritoneal fluidCOLLECTED:  05/27/22 13:21  ANTIBIOTICS AT COLL.:                                RECEIVED :  05/27/22 16:33  Stain, Gram                                FINAL       05/27/22 18:12  05/27/22   Few WBCs             No organisms seen             Stain performed on Cytospin (concentrated) specimen  Culture and Gram Stain, Aerobic, Body FluidFINAL       05/31/22 13:01  05/31/22   No growth      Urine culture [[id number]] Collected: 05/26/22 2234    Order Status: Completed Specimen: Bladder Updated: 05/28/22 1742    Narrative:      ORDER#: [id number]                                    ORDERED BY: Leigh Aurora, JODY  SOURCE: Urine                                        COLLECTED:  05/26/22 22:34  ANTIBIOTICS AT COLL.:                                RECEIVED :  05/27/22 14:13  Culture Urine                              FINAL       05/28/22 17:42  05/28/22   10,000 - 30,000 CFU/MLof normal urogenital or skin microbiota, no             further work      Culture + Gram Stain,Aerobic, Body Fluid [[id number]]     Order Status: Canceled Specimen: Body Fluid from Peritoneal Fluid (Abdominal)     Culture, Anaerobic Bacteria [[id number]]     Order Status: Sent Specimen: Other from Peritoneal Fluid (Abdominal)     Culture Blood Aerobic and Anaerobic [[id number]] Collected: 05/26/22 1604    Order Status: Completed Specimen: Arm from Blood, Venipuncture Updated: 05/31/22 2221    Narrative:      ORDER#: [id number]                                    ORDERED BY: Caroline More  SOURCE: Blood, Venipuncture Arm                       COLLECTED:  05/26/22 16:04  ANTIBIOTICS AT COLL.:  RECEIVED :  05/26/22 19:56  Culture Blood Aerobic and Anaerobic        FINAL       05/31/22 22:21  05/31/22   No growth after 5 days of incubation.      COVID-19 (SARS-CoV-2) and Influenza A/B, NAA (Liat Rapid) [[id number]] Collected: 05/26/22 1535    Order Status: Completed Specimen: Culturette from Nasopharyngeal Updated: 05/26/22 1601     Purpose of COVID testing Diagnostic -PUI     SARS-CoV-2 Specimen Source Nasal Swab     SARS CoV 2 Overall Result Not Detected     Comment: __________________________________________________  -A result of "Detected" indicates POSITIVE for the    presence of SARS CoV-2 RNA  -A result of "Not Detected" indicates NEGATIVE for the    presence of SARS CoV-2 RNA  __________________________________________________________  Test performed using the Roche cobas Liat SARS-CoV-2 assay. This assay is  only for use under the Food and Drug Administration's Emergency Use  Authorization. This is a real-time RT-PCR assay for the qualitative  detection of SARS-CoV-2 RNA. Viral nucleic acids may persist in vivo,  independent of viability. Detection of viral nucleic acid does not imply the  presence of infectious virus, or that virus nucleic acid is the cause of  clinical symptoms. Negative results do not preclude SARS-CoV-2 infection and  should not be used as the sole basis for diagnosis, treatment or other  patient management decisions. Negative results must be combined with  clinical observations, patient history, and/or epidemiological information.  Invalid results may be due to inhibiting substances in the specimen and  recollection should occur. Please see Fact Sheets for patients and providers  located:  WirelessDSLBlog.no          Influenza A Not Detected     Influenza B Not Detected     Comment: Test performed using the Roche cobas Liat SARS-CoV-2 & Influenza A/B assay.  This assay  is only for use under the Food and Drug Administration's  Emergency Use Authorization. This is a multiplex real-time RT-PCR assay  intended for the simultaneous in vitro qualitative detection and  differentiation of SARS-CoV-2, influenza A, and influenza B virus RNA. Viral  nucleic acids may persist in vivo, independent of viability. Detection of  viral nucleic acid does not imply the presence of infectious virus, or that  virus nucleic acid is the cause of clinical symptoms. Negative results do  not preclude SARS-CoV-2, influenza A, and/or influenza B infection and  should not be used as the sole basis for diagnosis, treatment or other  patient management decisions. Negative results must be combined with  clinical observations, patient history, and/or epidemiological information.  Invalid results may be due to inhibiting substances in the specimen and  recollection should occur. Please see Fact Sheets for patients and providers  located: http://www.rice.biz/.         Narrative:      o Collect and clearly label specimen type:  o PREFERRED-Upper respiratory specimen: One Nasal Swab in  Transport Media.  o Hand deliver to laboratory ASAP  Diagnostic -PUI    Culture Blood Aerobic and Anaerobic [[id number]] Collected: 05/26/22 1526    Order Status: Completed Specimen: Arm from Blood, Venipuncture Updated: 05/31/22 2221    Narrative:      ORDER#: [id number]                                    ORDERED BY:  BRACERO, VIVIAN  SOURCE: Blood, Venipuncture Arm                      COLLECTED:  09-Jun-2022 15:26  ANTIBIOTICS AT COLL.:                                RECEIVED :  09-Jun-2022 19:56  Culture Blood Aerobic and Anaerobic        FINAL       05/31/22 22:21  05/31/22   No growth after 5 days of incubation.      Culture Blood Aerobic and Anaerobic [[id number]]     Order Status: Canceled Specimen: Arm from Blood, Venipuncture              RADIOLOGY     Tunneled Cath Placement (Permcath)    Result Date: 06/01/2022    Technically successful placement of a 23 cm  permcath via the right internal jugular vein with tip in the right atrium. Catheter ready for use. Suszanne Finch, MD 06/01/2022 10:31 AM    Drain (Other)    Result Date: 05/31/2022   Unsuccessful attempt at repositioning the left sided PD catheter into the pelvis. Dimitrios Papadouris, MD 05/31/2022 4:58 PM    XR Chest AP Portable    Result Date: 05/30/2022  No acute cardiopulmonary disease. No change from XR CHEST AP ONLY with report dated 06-09-22 4:03 PM. [name], MD 05/30/2022 1:12 PM    XR ABDOMEN 2 VIEW    Result Date: 05/29/2022  No evidence of kinking of the peritoneal dialysis catheter in the lateral left abdomen/pelvis. Carlynn Spry, MD 05/29/2022 5:44 PM    CT Chest Abdomen Pelvis WO Contrast    Result Date: 09-Jun-2022  1. Grade 3 pronounced right-sided hydronephrosis and diffuse ureterectasis to the level of the right ureterovesical junction of uncertain etiology. This is new from prior magnetic resonance imaging 2. Several small masses of the right kidney may represent small solid masses or complex cysts. These could be further evaluated by renal magnetic resonance imaging when patient is clinically stable [name], MD 09-Jun-2022 4:47 PM    Chest AP Only    Result Date: Jun 09, 2022  No acute cardiopulmonary disease. Fonnie Mu, DO 06-09-22 4:03 PM   Echo Results       None          No results found for this or any previous visit.    Signed,  Gayleen Orem, MD  2:53 PM 06/01/2022

## 2022-06-01 NOTE — Sedation Documentation (Addendum)
[name] to F- Lab for a tunneled catheter placement under moderate sedation with Dr. Arlyce Dice. Patient transferred to table in supine position. Procedure facilitated under ultrasound and fluoroscopic guidance. 14.17fr/23cm tunneled catheter placed on the right IJ. Secured with sutures to skin. Biopatch, gauze and tegaderm applied- CDI. Patient tolerated procedure well, VSS.  200 mcg Fentanyl given  1 mg midazolam given  Pt transferred to room and called report to Unit RN.

## 2022-06-01 NOTE — Progress Notes (Signed)
HD TX Completed x3hrs, 1.1L fluid removed, tolerated well, aox3,vs wdl, Report given to [facility] RN.  Patient Vitals for the past 4 hrs:   BP Temp Pulse Resp   06/01/22 1827 155/67 98 F (36.7 C) 71 15   06/01/22 1815 128/63 -- 71 --   06/01/22 1800 138/60 -- 75 --   06/01/22 1745 124/60 -- 89 --   06/01/22 1730 122/58 -- 78 --   06/01/22 1715 124/59 -- 78 --   06/01/22 1700 129/63 -- 75 --   06/01/22 1645 134/62 -- 79 --   06/01/22 1630 126/71 -- 77 --   06/01/22 1615 125/60 -- 77 --   06/01/22 1600 126/59 -- 77 --   06/01/22 1545 135/65 -- 93 --   06/01/22 1530 154/70 -- 75 --   06/01/22 1515 154/70 98.1 F (36.7 C) 75 15        06/01/22 1827   Treatment Summary   Time Off Machine 1825   Duration of Treatment (Hours) 3   Treatment Type 1:1   Dialyzer Clearance Moderately streaked   Fluid Volume Off (mL) 1500   Prime Volume (mL) 200   Rinseback Volume (mL) 200   Fluid Given: Normal Saline (mL) 0   Fluid Given: PRBC  0 mL   Fluid Given: Albumin (mL) 0   Fluid Given: Other (mL) 0   Total Fluid Given 400   Hemodialysis Net Fluid Removed 1100   Post Treatment Assessment   Post-Treatment Weight (Kg) -1.1   Patient Response to Treatment tolerated well   Additional Dialyzer Used 0   Permacath Catheter - Tunneled 06/01/22 Internal Jugular Right   Placement Date/Time: 06/01/22 0956   Inserted by: Dr. Arlyce Dice  Access Type: Internal Jugular  Orientation: Right  Central Line Infection Prevention Education provided?: Yes  Hand Hygiene: Alcohol based hand scrub;Soap and water  Line cart used?: No  Li...   Line necessity reviewed? Apheresis/hemodialysis   Site Assessment Clean;Dry;Intact   Catheter Lumen Volume Venous 1.7 mL   Catheter Lumen Volume Arterial 1.7 mL   Dressing Status and Intervention Dressing Intact;Clean & Dry   Tego/Curos Caps on Catheter Yes   NEW Tego/Curos Caps placed (Date) 06/01/22   APHERESIS ONLY:  Access  Red   APHERESIS ONLY: Return Blue   End Caps Free From Blood Yes   Line Care Connections checked and  tightened   Dressing Type Transparent;Biopatch   Dressing Status Clean;Dry;Intact   Dressing/Line Intervention Caps changed   Vitals   Temp 98 F (36.7 C)   Heart Rate 71   Resp Rate 15   BP 155/67   SpO2 98 %   O2 Device None (Room air)   Assessment   Mental Status Alert;Oriented;Cooperative   Cardiac (WDL) WDL   Cardiac Regularity Return to [facility]   Cardiac Rhythm Normal sinus rhythm   Respiratory  WDL   Respiratory Pattern Return to WDL   Bilateral Breath Sounds Diminished   R Breath Sounds Diminished   L Breath Sounds Diminished   Cough Spontaneous   Edema  WDL   General Skin Color Appropriate for ethnicity   Skin Condition/Temp Return to WDL   Gastrointestinal (WDL) X   Abdomen Inspection Rounded   Pain Assessment   Charting Type Assessment   Pain Scale Used Numeric Scale (0-10)   Numeric Pain Scale   Pain Score 0   POSS Score 1   Multiple Pain Sites No   Education   Person taught Patient  Knowledge basis Substantial   Topics taught Procedure   Teaching Tools Explain   Gean Maidens Understanding   Bedside Nurse Communication   Name of bedside RN - post dialysis Jenene Slicker RN

## 2022-06-01 NOTE — Progress Notes (Signed)
HD TX Started via R tunneled CVC, aox4, denies any discomfort as of now, VS wdl, Dirshaye RN aware.   06/01/22 1515   Bedside Nurse Communication   Name of bedside RN - pre dialysis Jenene Slicker RN   Treatment Initiation- With Dialysis Precautions   Time Out/Safety Check Completed Yes   Consent for HD signed for this hospitalization (Date) 06/01/22   Consent for HD signed for this hospitalization (Time) 0316   Preferred language No   Blood Consent Verified N/A   Dialysis Precautions All Connections Secured   Dialysis Treatment Type 1st Time Acute   Is patient diabetic? No   RO/Hemodialysis Cabin crew   Is Total Chlorine less than 0.1 ppm? No   Orignial Total Chlorine Testing Time 1500   At 4 Hour Total Chlorine Testing Time 1900   RO/Hemodialysis Arts development officer Number 6    Machine Serial Number [id number]   RO # 26   RO Serial # [id number]   Water Hardness NA   pH 7.2   Pressure Test Verified Yes   Alarms Verified Passed   Machine Temperature 97.7 F (36.5 C)   Alarms Verified Yes   Na+ mEq (Machine) 138 mEq   Bicarb mEq (Machine) 35 mEq   Hemodialysis Conductivity (Machine) 13.6   Hemodialysis Conductivity (Meter) 13.5   Dialyzer Lot Number [id number]   Tubing Lot Number [id number]   RO Machine Log Completed Yes   Hepatitis Status   HBsAg Date Drawn 06/01/22   HBsAg Repeat Draw Due Date 06/29/22   Dialysis Weight   Pre-Treatment Weight (Kg) 88.5   Scale Type ICU Bed Scale   Vitals   Temp 98.1 F (36.7 C)   Heart Rate 75   Resp Rate 15   BP 154/70   SpO2 98 %   O2 Device None (Room air)   Assessment   Mental Status Alert;Oriented;Cooperative   Cardiac (WDL) WDL   Cardiac Regularity Return to [facility]   Cardiac Rhythm Normal sinus rhythm   Respiratory  WDL   Respiratory Pattern Return to WDL   Bilateral Breath Sounds Diminished   R Breath Sounds Diminished   L Breath Sounds Diminished   Cough Spontaneous   Edema  WDL   General Skin Color Appropriate for ethnicity   Skin Condition/Temp Return to [facility]    Abdomen Inspection Rounded   Mobility Bed   Permacath Catheter - Tunneled 06/01/22 Internal Jugular Right   Placement Date/Time: 06/01/22 0956   Inserted by: Dr. Arlyce Dice  Access Type: Internal Jugular  Orientation: Right  Central Line Infection Prevention Education provided?: Yes  Hand Hygiene: Alcohol based hand scrub;Soap and water  Line cart used?: No  Li...   Line necessity reviewed? Apheresis/hemodialysis   Site Assessment Clean;Dry;Intact   Catheter Lumen Volume Venous 1.7 mL   Catheter Lumen Volume Arterial 1.7 mL   Dressing Status and Intervention Dressing Intact;Clean & Dry   Tego/Curos Caps on Catheter Yes   NEW Tego/Curos Caps placed (Date) 06/01/22   APHERESIS ONLY:  Access  Red   APHERESIS ONLY: Return Blue   End Caps Free From Blood Yes   Line Care Connections checked and tightened   Dressing Type Transparent;Biopatch   Dressing Status Clean;Dry;Intact   Dressing/Line Intervention Caps changed;Dressing changed   Pain Assessment   Charting Type Assessment   Pain Scale Used Numeric Scale (0-10)   Numeric Pain Scale   Pain Score 0   POSS Score 1   Multiple  Pain Sites No   Hemodialysis Comments   Pre-Hemodialysis Comments TIMEOUT & SAFETY CHECKS DONE

## 2022-06-01 NOTE — Brief Op Note (Signed)
BRIEF VIR PROCEDURE NOTE    Date Time: 06/01/22 11:30 AM    Patient Name:     Shelby Barron    Date of Operation:     06/01/2022    Providers Performing:     Surgeon(s):  Suszanne Finch, MD    Assistant (s): RT    Operative Procedure:     Tunneled dialysis catheter placement    Preoperative Diagnosis:     Pre-Op Diagnosis Codes:     * ESRD (end stage renal disease) [N18.6]    Postoperative Diagnosis:     Same as preoperative diagnosis    Anesthesia:     Moderate Sedation, Local with 1% Lidocaine, and Local with 2% Lidocaine with Epinephrine    A safety timeout was performed.     Estimated Blood Loss:     Minimal    Implants:     Implant Name Type Inv. Item Serial No. Manufacturer Lot No. LRB No. Used Action   CATHETER HEMODIALYSIS OD14.5 FR L28 CM - [id number] Catheter CATHETER HEMODIALYSIS OD14.5 FR L28 CM  BARD ACCESS [id number] Right 1 Implanted       Specimens:     None    Findings:     Patent right internal jugular vein. Direct ultrasound and fluoroscopic guidance were used to place a  23 cm Equistream via the right internal jugular vein. The catheter is ready for use.    Complications:     None                                                              Suszanne Finch, MD    [facility]  [facility], Division of Vascular & Interventional Radiology  [facility]- [phone]  [facility] - [phone]  [phone] IFOH--[phone]

## 2022-06-01 NOTE — Progress Notes (Signed)
Patient to initiate dialysis today, hep labs have been ordered. Writer to complete referral when all missing items are avail.  Team updated via chat.    Gypsy Decant, HD Care Coordinator  [phone]

## 2022-06-02 ENCOUNTER — Encounter: Payer: Self-pay | Admitting: Interventional Radiology and Diagnostic Radiology

## 2022-06-02 LAB — BASIC METABOLIC PANEL
Anion Gap: 13 (ref 5.0–15.0)
BUN: 19 mg/dL (ref 7.0–21.0)
CO2: 25 mEq/L (ref 17–29)
Calcium: 8.4 mg/dL (ref 7.9–10.2)
Chloride: 102 mEq/L (ref 99–111)
Creatinine: 7.6 mg/dL — ABNORMAL HIGH (ref 0.4–1.0)
Glucose: 117 mg/dL — ABNORMAL HIGH (ref 70–100)
Potassium: 3.6 mEq/L (ref 3.5–5.3)
Sodium: 140 mEq/L (ref 135–145)
eGFR: 4.9 mL/min/{1.73_m2} — AB (ref >=60)

## 2022-06-02 LAB — CBC AND DIFFERENTIAL
Absolute NRBC: 0.02 10*3/uL — ABNORMAL HIGH (ref 0.00–0.00)
Basophils Absolute Automated: 0.06 10*3/uL (ref 0.00–0.08)
Basophils Automated: 0.5 %
Eosinophils Absolute Automated: 0.01 10*3/uL (ref 0.00–0.44)
Eosinophils Automated: 0.1 %
Hematocrit: 28.1 % — ABNORMAL LOW (ref 34.7–43.7)
Hgb: 8.9 g/dL — ABNORMAL LOW (ref 11.4–14.8)
Immature Granulocytes Absolute: 0.18 10*3/uL — ABNORMAL HIGH (ref 0.00–0.07)
Immature Granulocytes: 1.6 %
Instrument Absolute Neutrophil Count: 7.37 10*3/uL — ABNORMAL HIGH (ref 1.10–6.33)
Lymphocytes Absolute Automated: 1.92 10*3/uL (ref 0.42–3.22)
Lymphocytes Automated: 17.5 %
MCH: 30.9 pg (ref 25.1–33.5)
MCHC: 31.7 g/dL (ref 31.5–35.8)
MCV: 97.6 fL — ABNORMAL HIGH (ref 78.0–96.0)
MPV: 9.6 fL (ref 8.9–12.5)
Monocytes Absolute Automated: 1.42 10*3/uL — ABNORMAL HIGH (ref 0.21–0.85)
Monocytes: 13 %
Neutrophils Absolute: 7.37 10*3/uL — ABNORMAL HIGH (ref 1.10–6.33)
Neutrophils: 67.3 %
Nucleated RBC: 0.2 /100 WBC — ABNORMAL HIGH (ref 0.0–0.0)
Platelets: 224 10*3/uL (ref 142–346)
RBC: 2.88 10*6/uL — ABNORMAL LOW (ref 3.90–5.10)
RDW: 15 % (ref 11–15)
WBC: 10.96 10*3/uL — ABNORMAL HIGH (ref 3.10–9.50)

## 2022-06-02 LAB — IRON PROFILE
Iron Saturation: 43 % (ref 15–50)
Iron: 62 ug/dL (ref 32–157)
TIBC: 145 ug/dL — ABNORMAL LOW (ref 265–497)
UIBC: 83 ug/dL — ABNORMAL LOW (ref 126–382)

## 2022-06-02 LAB — WHOLE BLOOD GLUCOSE POCT
Whole Blood Glucose POCT: 148 mg/dL — ABNORMAL HIGH (ref 70–100)
Whole Blood Glucose POCT: 164 mg/dL — ABNORMAL HIGH (ref 70–100)
Whole Blood Glucose POCT: 150 mg/dL — ABNORMAL HIGH (ref 70–100)
Whole Blood Glucose POCT: 170 mg/dL — ABNORMAL HIGH (ref 70–100)

## 2022-06-02 LAB — PHOSPHORUS: Phosphorus: 4.2 mg/dL (ref 2.3–4.7)

## 2022-06-02 LAB — HEMOLYSIS INDEX(SOFT): Hemolysis Index: 2 Index (ref 0–24)

## 2022-06-02 MED ORDER — LOPERAMIDE HCL 2 MG PO CAPS
2.0000 mg | ORAL_CAPSULE | Freq: Four times a day (QID) | ORAL | 0 refills | Status: DC | PRN
Start: 2022-06-02 — End: 2022-06-04

## 2022-06-02 MED ORDER — LEVOFLOXACIN 250 MG PO TABS
250.0000 mg | ORAL_TABLET | Freq: Every day | ORAL | 0 refills | Status: DC
Start: 2022-06-03 — End: 2022-06-04

## 2022-06-02 MED ORDER — DICYCLOMINE HCL 10 MG PO CAPS
20.0000 mg | ORAL_CAPSULE | Freq: Once | ORAL | Status: AC
Start: 2022-06-02 — End: 2022-06-02
  Administered 2022-06-02: 20 mg via ORAL
  Filled 2022-06-02: qty 2

## 2022-06-02 MED ORDER — INSULIN DEGLUDEC 200 UNIT/ML SC SOPN
12.0000 [IU] | PEN_INJECTOR | Freq: Every day | SUBCUTANEOUS | Status: DC
Start: 2022-06-02 — End: 2022-06-23

## 2022-06-02 MED ORDER — RISAQUAD PO CAPS
1.0000 | ORAL_CAPSULE | Freq: Every day | ORAL | 0 refills | Status: AC
Start: 2022-06-03 — End: 2022-06-08

## 2022-06-02 MED ORDER — VANCOMYCIN HCL 125 MG PO CAPS
125.0000 mg | ORAL_CAPSULE | Freq: Four times a day (QID) | ORAL | Status: DC
Start: 2022-06-02 — End: 2022-06-03
  Administered 2022-06-02 – 2022-06-03 (×4): 125 mg via ORAL
  Filled 2022-06-02 (×6): qty 1

## 2022-06-02 MED ORDER — LOPERAMIDE HCL 2 MG PO CAPS
2.0000 mg | ORAL_CAPSULE | Freq: Four times a day (QID) | ORAL | Status: DC | PRN
Start: 2022-06-02 — End: 2022-06-02
  Administered 2022-06-02: 2 mg via ORAL
  Filled 2022-06-02: qty 1

## 2022-06-02 MED ORDER — HYOSCYAMINE SULFATE 0.125 MG PO TABS
0.1250 mg | ORAL_TABLET | ORAL | 0 refills | Status: DC | PRN
Start: 2022-06-02 — End: 2022-08-05

## 2022-06-02 NOTE — Progress Notes (Signed)
[facility]  PROGRESS NOTE      Patient: Shelby Barron  Date: 06/02/2022   LOS: 7 Days  Admission Date: 05/26/2022   MRN: [id number]  Attending: Gayleen Orem, MD  When on service as the attending, please contact me on Epic Secure Chat from 7 AM - 7 PM for non-urgent issues. For urgent matters use XTend page from 7 AM - 7 PM.       ASSESSMENT/PLAN     Shelby Barron is a 82 y.o. female admitted with Peritonitis    Interval Summary: 82 y.o. 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   Patient was sent to the ER by her nephrologist for dysuria and suprapubic pain peristent despite 5d course of Levaquin.  CT showing new finding of grade 3 pronounced right -sided hydronephrosis and diffuse ureterectasis to the level of the right UVJ and several small masses of the right kidney. Urology consulted and did not recommend any acute intervention this admission for worsening hydro, just continue treatment with empiric antibiotics for UTI.  Stay complicated with frequent watery diarrhea ID would like to rule out C. difficile and started p.o. vancomycin and ordered C. difficile test.    5/15  Patient reported initially this morning 2 episode of watery diarrhea.  By midday she had another 3 episode.  Stool softener has been discontinued  No significant abdominal pain or fever  ID would like to C. difficile infection.    Patient Active Hospital Problem List:  Sepsis 2/2 UTI resolved   - lactic normal, leukocytosis improving   -Blood culture and urine culture remain negative  -Given patient failed outpatient Levaquin infectious disease consulted recommended to continue Zosyn over the weekend, so patient received cefepime for 3 days, Zosyn for 4 days and Levaquin for 1 day  Antibiotic  discontinued on 5/15 because of diarrhea    Frequent watery diarrhea  Initially there was concern for constipation and patient was on stool softener which was discontinued on 5/14, this morning reported 2 episode of diarrhea for me and by mid-day patient reported 5 episodes of diarrhea for infectious disease doctor.  Given high risk for C. difficile ID discontinued antibiotic, ordered oral vancomycin and C. difficile test.  Of note no abdominal pain or leukocytosis in fact leukocytosis resolved.  If C. difficile test remain negative will [place] oral vancomycin and give him more Imodium  Plan discussed with daughter question answered concern addressed    High grade urothelioma bladder CA   Obstructive uropathy with grade 3 hydronephrosis  RCC s/p partial nephrectomy    - Followed by Dr Celine Mans outpatient with plans for cystectomy and nephrectomy   - Urology consulted, since no pyelo on imaging likely nothing to do for worsening hyrdo this admission   - CTAP with grade 3 R sided hydronephrosis and ureterectasis to the R UVJ. Several small masses of the R kidney which may be solid masses vs complex cysts.  - pain controlled    Malfunctioning peritoneal dialysis portal  At this point peritoneal dialysis fairly working per nephrology and recommend to continue  Ordered abdominal x-ray to see if it is kinked if not possible manipulation by IR as an outpatient.  Status post IR evaluation of the peritoneum post  done on 05/31/2022 which is unsuccessful recommended surgical evaluation, nephrology planning to switch to hemodialysis  S/p tunneled dialysis cath done on 5/14.  Outpatient follow-up with nephrology.    COPD  Not in exacerbation   stable, chest x-ray unremarkable  Continue anoro ellipta & singulair per home dose  As needed albuterol for shortness of breath    HTN  HLD  H/o CVA  Continue metoprolol/atorvastatin     ESRD on PD  HAGMA 2/2 ESRD   nephrology consulted, continue PD as ordered  Continue nephrovite, sensipar,  renvela  tunneled cath was placed on 5/14 because of the PD cath dysfunction  Outpatient dialysis seat arranged follow-up with nephrology once discharged.    DMII  A1c is 8.5   continue reduced dose lantus 10 U daily   low dose SSI  POC fairly controlled with the above regimen   carb consistent diet       GERD  Continue PPI     Gout  Continue allopurinol     Iron deficient anemia  Anemia of chronic disease  Continue iron supplement  Outpatient follow-up with PCP/nephrologist      Nutrition: renal diet          Patient has BMI=Body mass index is 33.91 kg/m.  Diagnosis: Obesity based on BMI criteria                     Recent Labs   Lab 06/02/22  0514 06/01/22  0315 05/31/22  0515   Hgb 8.9* 9.7* 9.4*   Hematocrit 28.1* 29.4* 29.6*   MCV 97.6* 95.8 96.1*   WBC 10.96* 12.58* 12.42*   Platelets 224 259 251     Recent Labs   Lab 06/02/22  0514   Iron 62   TIBC 145*   Iron Saturation 43     Anemia Diagnosis: Chronic: Anemia of Chronic Kidney Disease    Code Status: Full Code    Dispo: New onset diarrhea evaluation and treatment  Family Contact: Updated daughter over the phone  DVT Prophylaxis:   Current Facility-Administered Medications (Includes Only Anticoagulants, Misc. Hematological)   Medication Dose Route Last Admin    clopidogrel (PLAVIX) tablet 75 mg  75 mg Oral 75 mg at 06/02/22 0819    heparin (porcine) injection 5,000 Units  5,000 Units Subcutaneous 5,000 Units at 06/02/22 0602          CHART  REVIEW & DISCUSSION     The following chart items were reviewed as of 2:39 PM on 06/02/22:  [x]  Lab Results [x]  Imaging Results   []  Problem List  [x]  Current Orders [x]  Current Medications  []  Allergies  [x]  Code Status [x]  Previous Notes   []  SDoH    The management and plan of care for this patient was discussed with the following specialty consultants:  []  Cardiology  []  Gastroenterology                 [x]  Infectious Disease  []  Pulmonology []  Neurology                [x]  Nephrology  []  Neurosurgery []  Orthopedic  Surgery  []  Heme/Onc  []  General Surgery []  Psychiatry                                   [x]  Urology    SUBJECTIVE     Shelby Barron   Reported  5 episodes of watery diarrhea  Denies chest pain, lightheadedness, nausea,    MEDICATIONS     Current Facility-Administered Medications   Medication Dose Route Frequency    allopurinol  100 mg Oral Daily    atorvastatin  40 mg Oral Daily    b complex-vitamin c-folic acid  1 tablet Oral Daily    clopidogrel  75 mg Oral Daily    heparin (porcine)  5,000 Units Subcutaneous Q8H SCH    insulin glargine  10 Units Subcutaneous QAM    insulin lispro  1-3 Units Subcutaneous QHS    insulin lispro  1-5 Units Subcutaneous TID AC    iron polysaccharides  150 mg Oral Daily    lactobacillus/streptococcus  1 capsule Oral Daily    metoprolol tartrate  12.5 mg Oral Daily    montelukast  10 mg Oral QHS    ondansetron  4 mg Intravenous Once    ondansetron  4 mg Intravenous Once    oxybutynin XL  5 mg Oral Daily    pantoprazole  40 mg Oral Q12H    prochlorperazine  5 mg Intravenous Once    sevelamer carbonate  1,600 mg Oral BID AC    umeclidinium-vilanterol  1 puff Inhalation QAM    vancomycin  125 mg Oral 4 times per day       PHYSICAL EXAM     Vitals:    06/02/22 1204   BP: 106/62   Pulse: 86   Resp: 17   Temp: 97.7 F (36.5 C)   SpO2: 97%       Temperature: Temp  Min: 97.7 F (36.5 C)  Max: 98.2 F (36.8 C)  Pulse: Pulse  Min: 68  Max: 94  Respiratory: Resp  Min: 15  Max: 22  Non-Invasive BP: BP  Min: 106/62  Max: 160/76  Pulse Oximetry SpO2  Min: 94 %  Max: 99 %    Intake and Output Summary (Last 24 hours) at Date Time    Intake/Output Summary (Last 24 hours) at 06/02/2022 1439  Last data filed at 06/02/2022 1202  Gross per 24 hour   Intake 390 ml   Output 1100 ml   Net -710 ml             GEN APPEARANCE: Normal;  A&OX3  HEENT: PERLA; EOMI; Conjunctiva Clear  NECK: Supple; No bruits  CVS: RRR, S1, S2; No M/G/R  LUNGS: CTAB; No Wheezes; No Rhonchi: No rales  ABD: Soft; nontender; +  Normoactive BS  EXT: No edema; Pulses 2+ and intact  NEURO: CN 2-12 intact; No Focal neurological deficits  MENTAL STATUS: Conversant and cooperative    LABS     Recent Labs   Lab 06/02/22  0514 06/01/22  0315 05/31/22  0515   WBC 10.96* 12.58* 12.42*   RBC 2.88* 3.07* 3.08*   Hgb 8.9* 9.7* 9.4*   Hematocrit 28.1* 29.4* 29.6*   MCV 97.6* 95.8 96.1*   Platelets 224 259 251       Recent Labs   Lab 06/02/22  0514 06/01/22  0315 05/31/22  0515 05/30/22  0453 05/28/22  0943 05/27/22  0431   Sodium 140 139 139 139 138 138   Potassium 3.6 3.8 4.0 3.8 4.5 4.3   Chloride 102 98* 97* 98* 98* 100   CO2 25 24 24 24 23 23    BUN 19.0 48.0* 44.0* 50.0* 56.0* 57.0*   Creatinine 7.6* 14.2* 13.6* 14.3* 14.8* 14.4*   Glucose 117* 130* 185* 111*  185* 167*   Calcium 8.4 8.7 8.9 8.7 9.3 9.5   Magnesium  --  2.2  --  2.3  --  2.5       Recent Labs   Lab 05/26/22  1526   ALT 13   AST (SGOT) 18   Bilirubin, Total 0.6   Albumin 3.2*   Alkaline Phosphatase 78       Recent Labs   Lab 05/26/22  1837 05/26/22  1526   hs Troponin-I 9.6 11.8   hs Troponin-I Delta -2.2  --        Recent Labs   Lab 06/01/22  0315   PT INR 1.1   PT 12.7   PTT 27       Microbiology Results (last 15 days)       Procedure Component Value Units Date/Time    Clostridium difficile toxin B PCR [[id number]]     Order Status: Sent Specimen: Stool     Culture + Gram Stain,Aerobic, Body Fluid [[id number]] Collected: 05/27/22 1321    Order Status: Completed Specimen: Body Fluid from Peritoneal Fluid (Abdominal) Updated: 05/31/22 1301    Narrative:      ORDER#: [id number]                                    ORDERED BY: Georgiana Shore  SOURCE: Peritoneal Fluid (Abdominal) peritoneal fluidCOLLECTED:  05/27/22 13:21  ANTIBIOTICS AT COLL.:                                RECEIVED :  05/27/22 16:33  Stain, Gram                                FINAL       05/27/22 18:12  05/27/22   Few WBCs             No organisms seen             Stain performed on Cytospin (concentrated)  specimen  Culture and Gram Stain, Aerobic, Body FluidFINAL       05/31/22 13:01  05/31/22   No growth      Urine culture [[id number]] Collected: 05/26/22 2234    Order Status: Completed Specimen: Bladder Updated: 05/28/22 1742    Narrative:      ORDER#: [id number]                                    ORDERED BY: Leigh Aurora, JODY  SOURCE: Urine                                        COLLECTED:  05/26/22 22:34  ANTIBIOTICS AT COLL.:                                RECEIVED :  05/27/22 14:13  Culture Urine                              FINAL       05/28/22 17:42  05/28/22  10,000 - 30,000 CFU/MLof normal urogenital or skin microbiota, no             further work      Culture + Gram Stain,Aerobic, Body Fluid [[id number]]     Order Status: Canceled Specimen: Body Fluid from Peritoneal Fluid (Abdominal)     Culture, Anaerobic Bacteria [[id number]]     Order Status: Sent Specimen: Other from Peritoneal Fluid (Abdominal)     Culture Blood Aerobic and Anaerobic [[id number]] Collected: 05/26/22 1604    Order Status: Completed Specimen: Arm from Blood, Venipuncture Updated: 05/31/22 2221    Narrative:      ORDER#: [id number]                                    ORDERED BY: Caroline More  SOURCE: Blood, Venipuncture Arm                      COLLECTED:  05/26/22 16:04  ANTIBIOTICS AT COLL.:                                RECEIVED :  05/26/22 19:56  Culture Blood Aerobic and Anaerobic        FINAL       05/31/22 22:21  05/31/22   No growth after 5 days of incubation.      COVID-19 (SARS-CoV-2) and Influenza A/B, NAA (Liat Rapid) [[id number]] Collected: 05/26/22 1535    Order Status: Completed Specimen: Culturette from Nasopharyngeal Updated: 05/26/22 1601     Purpose of COVID testing Diagnostic -PUI     SARS-CoV-2 Specimen Source Nasal Swab     SARS CoV 2 Overall Result Not Detected     Comment: __________________________________________________  -A result of "Detected" indicates POSITIVE for the    presence of SARS CoV-2 RNA  -A result of "Not  Detected" indicates NEGATIVE for the    presence of SARS CoV-2 RNA  __________________________________________________________  Test performed using the Roche cobas Liat SARS-CoV-2 assay. This assay is  only for use under the Food and Drug Administration's Emergency Use  Authorization. This is a real-time RT-PCR assay for the qualitative  detection of SARS-CoV-2 RNA. Viral nucleic acids may persist in vivo,  independent of viability. Detection of viral nucleic acid does not imply the  presence of infectious virus, or that virus nucleic acid is the cause of  clinical symptoms. Negative results do not preclude SARS-CoV-2 infection and  should not be used as the sole basis for diagnosis, treatment or other  patient management decisions. Negative results must be combined with  clinical observations, patient history, and/or epidemiological information.  Invalid results may be due to inhibiting substances in the specimen and  recollection should occur. Please see Fact Sheets for patients and providers  located:  WirelessDSLBlog.no          Influenza A Not Detected     Influenza B Not Detected     Comment: Test performed using the Roche cobas Liat SARS-CoV-2 & Influenza A/B assay.  This assay is only for use under the Food and Drug Administration's  Emergency Use Authorization. This is a multiplex real-time RT-PCR assay  intended for the simultaneous in vitro qualitative detection and  differentiation of SARS-CoV-2, influenza A, and influenza B virus RNA. Viral  nucleic acids may persist in vivo, independent of viability. Detection of  viral  nucleic acid does not imply the presence of infectious virus, or that  virus nucleic acid is the cause of clinical symptoms. Negative results do  not preclude SARS-CoV-2, influenza A, and/or influenza B infection and  should not be used as the sole basis for diagnosis, treatment or other  patient management decisions. Negative results must be combined  with  clinical observations, patient history, and/or epidemiological information.  Invalid results may be due to inhibiting substances in the specimen and  recollection should occur. Please see Fact Sheets for patients and providers  located: http://www.rice.biz/.         Narrative:      o Collect and clearly label specimen type:  o PREFERRED-Upper respiratory specimen: One Nasal Swab in  Transport Media.  o Hand deliver to laboratory ASAP  Diagnostic -PUI    Culture Blood Aerobic and Anaerobic [[id number]] Collected: 06-18-2022 1526    Order Status: Completed Specimen: Arm from Blood, Venipuncture Updated: 05/31/22 2221    Narrative:      ORDER#: [id number]                                    ORDERED BY: Caroline More  SOURCE: Blood, Venipuncture Arm                      COLLECTED:  06-18-2022 15:26  ANTIBIOTICS AT COLL.:                                RECEIVED :  06/18/22 19:56  Culture Blood Aerobic and Anaerobic        FINAL       05/31/22 22:21  05/31/22   No growth after 5 days of incubation.      Culture Blood Aerobic and Anaerobic [[id number]]     Order Status: Canceled Specimen: Arm from Blood, Venipuncture              RADIOLOGY     Tunneled Cath Placement (Permcath)    Result Date: 06/01/2022   Technically successful placement of a 23 cm  permcath via the right internal jugular vein with tip in the right atrium. Catheter ready for use. Suszanne Finch, MD 06/01/2022 10:31 AM    Drain (Other)    Result Date: 05/31/2022   Unsuccessful attempt at repositioning the left sided PD catheter into the pelvis. Dimitrios Papadouris, MD 05/31/2022 4:58 PM    XR Chest AP Portable    Result Date: 05/30/2022  No acute cardiopulmonary disease. No change from XR CHEST AP ONLY with report dated 06/18/2022 4:03 PM. [name], MD 05/30/2022 1:12 PM    XR ABDOMEN 2 VIEW    Result Date: 05/29/2022  No evidence of kinking of the peritoneal dialysis catheter in the lateral left abdomen/pelvis. Carlynn Spry, MD 05/29/2022  5:44 PM    CT Chest Abdomen Pelvis WO Contrast    Result Date: 2022/06/18  1. Grade 3 pronounced right-sided hydronephrosis and diffuse ureterectasis to the level of the right ureterovesical junction of uncertain etiology. This is new from prior magnetic resonance imaging 2. Several small masses of the right kidney may represent small solid masses or complex cysts. These could be further evaluated by renal magnetic resonance imaging when patient is clinically stable [name], MD 2022-06-18 4:47 PM    Chest AP Only    Result Date:  05/26/2022  No acute cardiopulmonary disease. Fonnie Mu, DO 05/26/2022 4:03 PM   Echo Results       None          No results found for this or any previous visit.    Signed,  Gayleen Orem, MD  2:39 PM 06/02/2022

## 2022-06-02 NOTE — Progress Notes (Addendum)
OP HD CHAIR CONFIRMED -   [facility], TTS 7:45am with SOC planned or Thurs 5/16.   Details are also in AVS.     Addendum @ 12noon - daughter pref'd afternoon appt -chair time chgd to 12:45pm TTS.     Gypsy Decant, HD Care Coordinator  [phone]

## 2022-06-02 NOTE — Plan of Care (Signed)
NURSING SHIFT NOTE     Patient: Shelby Barron  Day: 7      SHIFT EVENTS     Shift Narrative/Significant Events (PRN med administration, fall, RRT, etc.):     Assumed pt responsibility, axo x4, on room air, VS stable, pt stable just showing some anxiety at night, pt requested her heart be listened to, scheduled medications given as ordered, No changes to pts condition, comfort measures provided to pt, no other changes to report on pts condition.     Safety and fall precautions remain in place. Purposeful rounding completed.          ASSESSMENT     Changes in assessment from patient's baseline this shift:    Neuro: No  CV: No  Pulm: No  Peripheral Vascular: No  HEENT: No  GI: No  BM during shift: Yes   , Last BM: Last BM Date: 06/01/22  GU: No   Integ: No  MS: No    Pain: None  Pain Interventions: None  Medications Utilized:     Mobility: PMP Activity: Step 6 - Walks in Room of Distance Walked (ft) (Step 6,7): 10 Feet           Lines     Patient Lines/Drains/Airways Status       Active Lines, Drains and Airways       Name Placement date Placement time Site Days    Permacath Catheter - Tunneled 06/01/22 Internal Jugular Right 06/01/22  0956  -- 1    Peripheral IV 05/29/22 20 G Right Antecubital 05/29/22  1620  Antecubital  4    Peritoneal Dialysis Catheter Mid lower abdomen 05/26/22  2000  -- 7                         VITAL SIGNS     Vitals:    06/02/22 2323   BP: 136/65   Pulse: 98   Resp: 18   Temp: 97.5 F (36.4 C)   SpO2: 100%       Temp  Min: 97.5 F (36.4 C)  Max: 98.2 F (36.8 C)  Pulse  Min: 72  Max: 98  Resp  Min: 15  Max: 22  BP  Min: 106/62  Max: 136/65  SpO2  Min: 94 %  Max: 100 %      Intake/Output Summary (Last 24 hours) at 06/02/2022 2325  Last data filed at 06/02/2022 1202  Gross per 24 hour   Intake 390 ml   Output --   Net 390 ml            Patient self-proning?: Yes     Patient using incentive spirometer?: No:    Amount achieved on incentive spirometer?: No data recorded      CARE PLAN         Problem: Pain interferes with ability to perform ADL  Goal: Pain at adequate level as identified by patient  Outcome: Progressing  Flowsheets (Taken 06/02/2022 1804 by Corrie Mckusick, RN)  Pain at adequate level as identified by patient:   Identify patient comfort function goal   Assess for risk of opioid induced respiratory depression, including snoring/sleep apnea. Alert healthcare team of risk factors identified.   Assess pain on admission, during daily assessment and/or before any "as needed" intervention(s)   Reassess pain within 30-60 minutes of any procedure/intervention, per Pain Assessment, Intervention, Reassessment (AIR) Cycle   Evaluate if patient comfort function goal is met  Evaluate patient's satisfaction with pain management progress   Offer non-pharmacological pain management interventions     Problem: Side Effects from Pain Analgesia  Goal: Patient will experience minimal side effects of analgesic therapy  Outcome: Progressing  Flowsheets (Taken 06/02/2022 1804 by Corrie Mckusick, RN)  Patient will experience minimal side effects of analgesic therapy:   Monitor/assess patient's respiratory status (RR depth, effort, breath sounds)   Assess for changes in cognitive function   Prevent/manage side effects per LIP orders (i.e. nausea, vomiting, pruritus, constipation, urinary retention, etc.)   Evaluate for opioid-induced sedation with appropriate assessment tool (i.e. POSS)     Problem: Moderate/High Fall Risk Score >5  Goal: Patient will remain free of falls  Outcome: Progressing  Flowsheets (Taken 06/02/2022 0800 by Corrie Mckusick, RN)  Moderate Risk (6-13):   MOD- Consider video monitoring   MOD-include family in multidisciplinary POC discussions   MOD-Use gait belt when appropriate   MOD-Request PT/OT consult order for patients with gait/mobility impairment   MOD-Perform dangle, stand, walk (DSW) prior to mobilization   MOD-Utilize diversion activities   MOD-Re-orient confused  patients   MOD-Place bedside commode and assistive devices out of sight when not in use   MOD-Remain with patient during toileting   MOD-Floor mat at bedside (where available) if appropriate   MOD-Consider a move closer to Nurses Station   MOD-Apply bed exit alarm if patient is confused   MOD-Consider activation of bed alarm if appropriate     Problem: Patient Receiving Advanced Renal Therapies  Goal: Therapy access site remains intact  Outcome: Progressing  Flowsheets (Taken 06/02/2022 1804 by Corrie Mckusick, RN)  Therapy access site remains intact: Assess therapy access site     Problem: Compromised Activity/Mobility  Goal: Activity/Mobility Interventions  Outcome: Progressing  Flowsheets (Taken 06/02/2022 1804 by Corrie Mckusick, RN)  Activity/Mobility Interventions: Pad bony prominences, TAP Seated positioning system when OOB, Promote PMP, Reposition q 2 hrs / turn clock, Offload heels     Problem: Fluid and Electrolyte Imbalance/ Endocrine  Goal: Fluid and electrolyte balance are achieved/maintained  Outcome: Progressing  Flowsheets (Taken 06/02/2022 1804 by Corrie Mckusick, RN)  Fluid and electrolyte balance are achieved/maintained:   Monitor/assess lab values and report abnormal values   Assess and reassess fluid and electrolyte status   Monitor for muscle weakness  Goal: Adequate hydration  Outcome: Progressing  Flowsheets (Taken 06/02/2022 1804 by Corrie Mckusick, RN)  Adequate hydration:   Assess mucus membranes, skin color, turgor, perfusion and presence of edema   Assess for peripheral, sacral, periorbital and abdominal edema   Monitor and assess vital signs and perfusion     Problem: Compromised Sensory Perception  Goal: Sensory Perception Interventions  Outcome: Progressing  Flowsheets (Taken 06/02/2022 1804 by Corrie Mckusick, RN)  Sensory Perception Interventions: Offload heels, Pad bony prominences, Reposition q 2hrs/turn Clock, Q2 hour skin assessment under devices if  present     Problem: Compromised Moisture  Goal: Moisture level Interventions  Outcome: Progressing  Flowsheets (Taken 06/02/2022 1804 by Corrie Mckusick, RN)  Moisture level Interventions: Moisture wicking products, Moisture barrier cream     Problem: Compromised Friction/Shear  Goal: Friction and Shear Interventions  Outcome: Progressing  Flowsheets (Taken 06/02/2022 1804 by Corrie Mckusick, RN)  Friction and Shear Interventions: Pad bony prominences, Off load heels, HOB 30 degrees or less unless contraindicated, Consider: TAP seated positioning, Heel foams

## 2022-06-02 NOTE — Progress Notes (Signed)
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 - OP urology follow up for surgery in the future [facility] urology)      Recommendations:   S/p Hd yesterday with 1.1l net UF  HD tomorrow  Appreciate CM assistance with obtaining HD chair at [facility]   Continue to hold cinacalcet for now  Will add on phos and iron panel to AM labs   Plan to return to PD after surgery if possible    Case discussed with: pt      Delice Bison, MD  [facility]  [id number] (office)      Subjective:   Ongoing diarrhea     Review of Systems:   No cp      Physical Exam:     Vitals:    06/01/22 2322 06/02/22 0515 06/02/22 0654 06/02/22 0819   BP: 160/76 117/64 120/56 127/59   Pulse: 88 94 76 89   Resp: 22 18 15     Temp: 97.9 F (36.6 C) 97.7 F (36.5 C) 98.2 F (36.8 C)    TempSrc: Oral Oral Oral    SpO2: 97% 94% 99%    Weight:       Height:           Intake and Output Summary (Last 24 hours) at Date Time    Intake/Output Summary (Last 24 hours) at 06/02/2022 0955  Last data filed at 06/01/2022 1827  Gross per 24 hour   Intake 400 ml   Output 1100 ml   Net -700 ml       General: awake, alert, no acute distress  Cardiovascular: regular rate and rhythm  Lungs: clear to auscultation bilaterally  Abdomen: soft, NT, ND  Extremities: no edema    Access: R TDC      Meds:      Scheduled Meds: PRN Meds:    allopurinol, 100 mg, Oral, Daily  atorvastatin, 40 mg, Oral, Daily  b complex-vitamin c-folic acid, 1 tablet, Oral, Daily  cinacalcet, 30 mg, Oral, Daily  clopidogrel, 75 mg, Oral, Daily  heparin (porcine), 5,000  Units, Subcutaneous, Q8H SCH  insulin glargine, 10 Units, Subcutaneous, QAM  insulin lispro, 1-3 Units, Subcutaneous, QHS  insulin lispro, 1-5 Units, Subcutaneous, TID AC  iron polysaccharides, 150 mg, Oral, Daily  lactobacillus/streptococcus, 1 capsule, Oral, Daily  levoFLOXacin, 250 mg, Oral, Daily  metoprolol tartrate, 12.5 mg, Oral, Daily  montelukast, 10 mg, Oral, QHS  ondansetron, 4 mg, Intravenous, Once  ondansetron, 4 mg, Intravenous, Once  oxybutynin XL, 5 mg, Oral, Daily  pantoprazole, 40 mg, Oral, Q12H  prochlorperazine, 5 mg, Intravenous, Once  sevelamer carbonate, 1,600 mg, Oral, BID AC  umeclidinium-vilanterol, 1 puff, Inhalation, QAM          Continuous Infusions:     acetaminophen, 650 mg, Q6H PRN  albuterol-ipratropium, 3 mL, Q6H PRN  benzocaine-menthol, 1 lozenge, Q2H PRN  benzonatate, 100 mg, TID PRN  carboxymethylcellulose sodium, 1 drop, TID PRN  dextrose, 15 g of glucose,  PRN   Or  dextrose, 12.5 g, PRN   Or  dextrose, 12.5 g, PRN   Or  glucagon (rDNA), 1 mg, PRN  diphenhydrAMINE, 25 mg, QHS PRN  HYDROmorphone, 0.2 mg, Q4H PRN  hyoscyamine, 0.125 mg, Q4H PRN  loperamide, 2 mg, 4X Daily PRN  melatonin, 3 mg, QHS PRN  naloxone, 0.2 mg, PRN  ondansetron, 4 mg, Q6H PRN   Or  ondansetron, 4 mg, Q6H PRN  oxyCODONE, 5 mg, Q6H PRN  saline, 2 spray, Q4H PRN              Labs:     Recent Labs   Lab 06/02/22  0514 06/01/22  0315 05/31/22  0515   WBC 10.96* 12.58* 12.42*   Hgb 8.9* 9.7* 9.4*   Hematocrit 28.1* 29.4* 29.6*   Platelets 224 259 251     Recent Labs   Lab 06/02/22  0514 06/01/22  0315 05/31/22  0515 05/30/22  0453 05/28/22  0943 05/27/22  0431 05/26/22  1526   Sodium 140 139 139 139  More results in Results Review 138 138   Potassium 3.6 3.8 4.0 3.8  More results in Results Review 4.3 5.0   Chloride 102 98* 97* 98*  More results in Results Review 100 95*   CO2 25 24 24 24   More results in Results Review 23 25   BUN 19.0 48.0* 44.0* 50.0*  More results in Results Review 57.0* 53.0*   Creatinine  7.6* 14.2* 13.6* 14.3*  More results in Results Review 14.4* 13.6*   Calcium 8.4 8.7 8.9 8.7  More results in Results Review 9.5 10.5*   Albumin  --   --   --   --   --   --  3.2*   Phosphorus  --  6.8*  --  6.2*  --  6.7*  --    Magnesium  --  2.2  --  2.3  --  2.5  --    Glucose 117* 130* 185* 111*  More results in Results Review 167* 98   eGFR 4.9* 2.3* 2.4* 2.3*  More results in Results Review 2.3* 2.4*   More results in Results Review = values in this interval not displayed.       Recent Labs   Lab 05/26/22  2234   Urine Type Urine, Catheriz   Color, UA Orange*   Clarity, UA Turbid*   Specific Gravity UA 1.020   Urine pH 6.5   Nitrite, UA Negative   Ketones UA Negative   Urobilinogen, UA Normal   Bilirubin, UA Negative   Blood, UA Moderate*   RBC, UA TNTC*   WBC, UA TNTC*           Tunneled Cath Placement (Permcath)    Result Date: 06/01/2022   Technically successful placement of a 23 cm  permcath via the right internal jugular vein with tip in the right atrium. Catheter ready for use. Suszanne Finch, MD 06/01/2022 10:31 AM         Signed by: Delice Bison, MD

## 2022-06-02 NOTE — Plan of Care (Signed)
NURSING SHIFT NOTE     Patient: Shelby Barron  Day: 7      SHIFT EVENTS     Shift Narrative/Significant Events (PRN med administration, fall, RRT, etc.):     Pt Aox4. VSS. Pt denies any pain. Pt complained of diarrhea. PRN Imodium given. Pt only have one BM today. Scheduled medication given. Educated pt on each medication given. Pt verbalized understanding. Assisted pt in setting table. Daughter visited and updated. Rounded with Dr. Don Broach. BG checked and covered per sliding scale. Encourage pt to use call button for any assistance or questions. Call button within reach. Bed on lowest position    Safety and fall precautions remain in place. Purposeful rounding completed.          ASSESSMENT     Changes in assessment from patient's baseline this shift:    Neuro: No  CV: No  Pulm: No  Peripheral Vascular: No  HEENT: No  GI: No  BM during shift: No, Last BM: Last BM Date: 06/01/22  GU: No   Integ: No  MS: No    Pain: na  Pain Interventions: None  Medications Utilized: na    Mobility: PMP Activity: Step 6 - Walks in Room of [employer] (ft) (Step 6,7): 10 Feet           Lines     Patient Lines/Drains/Airways Status       Active Lines, Drains and Airways       Name Placement date Placement time Site Days    Permacath Catheter - Tunneled 06/01/22 Internal Jugular Right 06/01/22  0956  -- 1    Peripheral IV 05/29/22 20 G Right Antecubital 05/29/22  1620  Antecubital  4    Peritoneal Dialysis Catheter Mid lower abdomen 05/26/22  2000  -- 6                         VITAL SIGNS     Vitals:    06/02/22 1516   BP: 110/64   Pulse: 72   Resp: 15   Temp: 97.7 F (36.5 C)   SpO2: 97%       Temp  Min: 97.7 F (36.5 C)  Max: 98.2 F (36.8 C)  Pulse  Min: 68  Max: 94  Resp  Min: 15  Max: 22  BP  Min: 106/62  Max: 160/76  SpO2  Min: 94 %  Max: 99 %      Intake/Output Summary (Last 24 hours) at 06/02/2022 1806  Last data filed at 06/02/2022 1202  Gross per 24 hour   Intake 390 ml   Output 1100 ml   Net -710 ml               Problem: Pain interferes with ability to perform ADL  Goal: Pain at adequate level as identified by patient  06/02/2022 1804 by Corrie Mckusick, RN  Outcome: Progressing  Flowsheets (Taken 06/02/2022 1804)  Pain at adequate level as identified by patient:   Identify patient comfort function goal   Assess for risk of opioid induced respiratory depression, including snoring/sleep apnea. Alert healthcare team of risk factors identified.   Assess pain on admission, during daily assessment and/or before any "as needed" intervention(s)   Reassess pain within 30-60 minutes of any procedure/intervention, per Pain Assessment, Intervention, Reassessment (AIR) Cycle   Evaluate if patient comfort function goal is met   Evaluate patient's satisfaction with pain management progress   Offer  non-pharmacological pain management interventions  06/02/2022 1530 by Corrie Mckusick, RN  Outcome: Progressing     Problem: Side Effects from Pain Analgesia  Goal: Patient will experience minimal side effects of analgesic therapy  06/02/2022 1804 by Corrie Mckusick, RN  Outcome: Progressing  Flowsheets (Taken 06/02/2022 1804)  Patient will experience minimal side effects of analgesic therapy:   Monitor/assess patient's respiratory status (RR depth, effort, breath sounds)   Assess for changes in cognitive function   Prevent/manage side effects per LIP orders (i.e. nausea, vomiting, pruritus, constipation, urinary retention, etc.)   Evaluate for opioid-induced sedation with appropriate assessment tool (i.e. POSS)  06/02/2022 1530 by Corrie Mckusick, RN  Outcome: Progressing     Problem: Moderate/High Fall Risk Score >5  Goal: Patient will remain free of falls  06/02/2022 1804 by Corrie Mckusick, RN  Outcome: Progressing  Flowsheets (Taken 06/02/2022 0800)  Moderate Risk (6-13):   MOD- Consider video monitoring   MOD-include family in multidisciplinary POC discussions   MOD-Use gait belt when appropriate    MOD-Request PT/OT consult order for patients with gait/mobility impairment   MOD-Perform dangle, stand, walk (DSW) prior to mobilization   MOD-Utilize diversion activities   MOD-Re-orient confused patients   MOD-Place bedside commode and assistive devices out of sight when not in use   MOD-Remain with patient during toileting   MOD-Floor mat at bedside (where available) if appropriate   MOD-Consider a move closer to Nurses Station   MOD-Apply bed exit alarm if patient is confused   MOD-Consider activation of bed alarm if appropriate  06/02/2022 1530 by Corrie Mckusick, RN  Outcome: Progressing     Problem: Patient Receiving Advanced Renal Therapies  Goal: Therapy access site remains intact  06/02/2022 1804 by Corrie Mckusick, RN  Outcome: Progressing  Flowsheets (Taken 06/02/2022 1804)  Therapy access site remains intact: Assess therapy access site  06/02/2022 1530 by Corrie Mckusick, RN  Outcome: Progressing     Problem: Compromised Activity/Mobility  Goal: Activity/Mobility Interventions  06/02/2022 1804 by Corrie Mckusick, RN  Outcome: Progressing  Flowsheets (Taken 06/02/2022 1804)  Activity/Mobility Interventions: Pad bony prominences, TAP Seated positioning system when OOB, Promote PMP, Reposition q 2 hrs / turn clock, Offload heels  06/02/2022 1530 by Corrie Mckusick, RN  Outcome: Progressing     Problem: Compromised Sensory Perception  Goal: Sensory Perception Interventions  06/02/2022 1804 by Corrie Mckusick, RN  Outcome: Progressing  Flowsheets (Taken 06/02/2022 1804)  Sensory Perception Interventions: Offload heels, Pad bony prominences, Reposition q 2hrs/turn Clock, Q2 hour skin assessment under devices if present  06/02/2022 1530 by Corrie Mckusick, RN  Outcome: Progressing     Problem: Compromised Moisture  Goal: Moisture level Interventions  06/02/2022 1804 by Corrie Mckusick, RN  Outcome: Progressing  Flowsheets (Taken 06/02/2022 1804)  Moisture level  Interventions: Moisture wicking products, Moisture barrier cream  06/02/2022 1530 by Corrie Mckusick, RN  Outcome: Progressing     Problem: Compromised Friction/Shear  Goal: Friction and Shear Interventions  06/02/2022 1804 by Corrie Mckusick, RN  Outcome: Progressing  Flowsheets (Taken 06/02/2022 1804)  Friction and Shear Interventions: Pad bony prominences, Off load heels, HOB 30 degrees or less unless contraindicated, Consider: TAP seated positioning, Heel foams  06/02/2022 1530 by Corrie Mckusick, RN  Outcome: Progressing     Problem: Fluid and Electrolyte Imbalance/ Endocrine  Goal: Fluid and electrolyte balance are achieved/maintained  06/02/2022 1804 by Corrie Mckusick, RN  Outcome:  Progressing  Flowsheets (Taken 06/02/2022 1804)  Fluid and electrolyte balance are achieved/maintained:   Monitor/assess lab values and report abnormal values   Assess and reassess fluid and electrolyte status   Monitor for muscle weakness  06/02/2022 1530 by Corrie Mckusick, RN  Outcome: Progressing  Goal: Adequate hydration  06/02/2022 1804 by Corrie Mckusick, RN  Outcome: Progressing  Flowsheets (Taken 06/02/2022 1804)  Adequate hydration:   Assess mucus membranes, skin color, turgor, perfusion and presence of edema   Assess for peripheral, sacral, periorbital and abdominal edema   Monitor and assess vital signs and perfusion  06/02/2022 1530 by Corrie Mckusick, RN  Outcome: Progressing

## 2022-06-02 NOTE — Discharge Instr - AVS First Page (Addendum)
[facility] DISCHARGE INSTRUCTIONS     Date of Admission: 05/26/2022    Date of Discharge: 06/02/2022    Discharge Physician: Gayleen Orem, MD    Thank you for choosing the [facility]  for your healthcare needs. It was a privilege caring for you. I am genuinely concerned about your health and comfort.      Below, please find detailed instructions regarding your medical care.     You were admitted for Peritonitis. It is important that you make your follow up appointments listed below.  Bring these discharge papers to your follow-up visit with your medical provider.   I cannot stress the importance of follow up enough.      Consultants: Neurology and infectious disease     Pending Studies: None    Discharge Diet: Carb controlled renal diet    Surgery/Procedure: Permacath placement    Further Instructions: Please keep up with your medication and follow-up.  Make sure to continue following up with nephrologist and continue dialysis.  Follow-up with your urologist regarding your bladder tumor. I have adjusted your insuline dose(lantus to 12 units at night) your were not requiring much in the hospital please follow up with your PCP for farther adjustment.     Follow up Appointments:   Follow-up Information       [facility]. Call in 1 day(s).    Specialty: Family Medicine  Why: Needs PCP and TCM to f/u in the community  Contact information:  [address]  [address]  [place] [address]  [phone]  Additional information:  From [address] - take [address] Go west on [address] Go through the intersection with [address]. Destination is on your left.      From [address] - go east on [address] past [employer]. Destination is on your right.             Delice Bison, MD Follow up in 1 week(s).    Specialties: Internal Medicine, Nephrology  Contact information:  [address]  [address] [address]  [phone]                follow up with your urologist Follow up in 1 week(s).                                 Please take your medications as prescribed. As we discussed, please review the attached handouts for more information about important side effects of your medication.    If you have any fever, chest pain, palpitations, shortness of breath or difficulty breathing, worsening nausea and vomiting, or lower leg pain please seek medical attention immediately.     Finally, below please find the results of imaging studies that you had in the hospital. Please review this with your primary care doctor.    You will likely be receiving a survey about the care you received in our hospital. It would mean a lot to me and all of the care team if you could fill that out and return the form. We're always looking to improve and your feedback will help [place] do that.     I wish you good health. Please do not hesitate to contact me if I can be of assistance. I aim to provide excellent care.     Respectfully Yours,      A , MD        If you are unable to obtain an appointment, unable to obtain newly prescribed medications, or are unclear about any of your discharge instructions please contact your discharging physician at [phone] (M-F, 8am-3pm) or weekends and after hours via the hospital operator [phone], hospital case manager, or your primary care physician.    Imaging Studies while hospitalized:  Tunneled Cath Placement (Permcath)    Result Date: 06/01/2022   Technically successful placement of a 23 cm  permcath via the right internal jugular vein with tip in the right atrium. Catheter ready for use. Suszanne Finch, MD 06/01/2022 10:31 AM    Drain (Other)    Result Date: 05/31/2022   Unsuccessful attempt at repositioning the left sided PD catheter into the pelvis. Dimitrios Papadouris, MD 05/31/2022 4:58 PM    XR Chest AP Portable    Result Date: 05/30/2022  No acute cardiopulmonary disease. No change from XR CHEST AP ONLY with  report dated 2022-06-23 4:03 PM. [name], MD 05/30/2022 1:12 PM    XR ABDOMEN 2 VIEW    Result Date: 05/29/2022  No evidence of kinking of the peritoneal dialysis catheter in the lateral left abdomen/pelvis. Carlynn Spry, MD 05/29/2022 5:44 PM    CT Chest Abdomen Pelvis WO Contrast    Result Date: 06/23/2022  1. Grade 3 pronounced right-sided hydronephrosis and diffuse ureterectasis to the level of the right ureterovesical junction of uncertain etiology. This is new from prior magnetic resonance imaging 2. Several small masses of the right kidney may represent small solid masses or complex cysts. These could be further evaluated by renal magnetic resonance imaging when patient is clinically stable [name], MD 23-Jun-2022 4:47 PM    Chest AP Only    Result Date: 2022-06-23  No acute cardiopulmonary disease. Fonnie Mu, DO 06-23-22 4:03 PM   Echo Results       None          No results found for this or any previous visit.    Please be sure to review these imaging studies with your primary medical provider.

## 2022-06-02 NOTE — OT Progress Note (Signed)
Occupational Therapy Treatment  Shelby Barron        Post Acute Care Therapy Recommendations   Discharge Recommendations:  Home with home health OT, Home with home health PT    DME needs IF patient is discharging home: No additional equipment/DME recommended at this time    Therapy discharge recommendations may change with patient status.  Please refer to most recent note for up-to-date recommendations.    Unit: [facility] 4 NORTH  Bed: A4027/A4027.01    ___________________________________________________    Time of treatment:  OT Received On: 06/02/22  Start Time: 1412  Stop Time: 1453  Time Calculation (min): 41 min       Chart Review and Collaboration with Care Team: 3 minutes, not included in above time.    OT Visit Number: 2      Precautions and Contraindications:    Precautions  Weight Bearing Status: no restrictions  Fall Risks: Low  Other Precautions: contact special    Personal Protective Equipment (PPE)  gloves and procedure mask    Updated Labs:  Lab Results   Component Value Date/Time    HGB 8.9 (L) 06/02/2022 05:14 AM    HCT 28.1 (L) 06/02/2022 05:14 AM    K 3.6 06/02/2022 05:14 AM    NA 140 06/02/2022 05:14 AM    INR 1.1 06/01/2022 03:15 AM    TROPI 9.6 05/26/2022 06:37 PM    TROPI -2.2 05/26/2022 06:37 PM    TROPI 11.8 05/26/2022 03:26 PM       All imaging reviewed, please see chart for details.    Subjective:    .  "I am very independent"       Patient's medical condition is appropriate for Occupational Therapy intervention at this time.  Patient is agreeable to participation in the therapy session. Nursing clears patient for therapy.  Pain Assessment  Pain Assessment: No/denies pain      Objective:  Observation of Patient/Vital Signs:     Stable    Cognition/Neuro Status  Arousal/Alertness: Appropriate responses to stimuli  Attention Span: Appears intact  Orientation Level: Oriented X4  Memory: Appears intact  Following Commands: Follows all commands and directions without  difficulty  Safety Awareness: independent  Insights: Fully aware of deficits  Behavior: calm;cooperative  Motor Planning: intact  Coordination: intact    Functional Mobility  Supine to Sit Transfers: Independent  Sit to Supine Transfers: Independent  Sit to Stand Transfers: Stand by Assist  Stand to Sit Transfers: Stand by Assist  Functional Mobility/Ambulation: Stand by Assist (with RW)    Self-care and Home Management  Grooming: Supervision;sitting at sink;teeth care;wash/dry face  Bathing: Supervision;sitting at sink;chest;right arm;left arm;abdomen;perineal area;buttocks;left upper leg;right upper leg;Maximal Assist;right lower leg including foot;left lower leg including foot  UB Dressing: Contact Guard Assist (2/2 gown needing to be snapped)  LB Dressing: Maximal Assist;Don/doff R sock;Don/doff L sock (due to positioning)  Toileting: Supervision;perineal hygiene;clothing management down;clothing management up  Functional Transfers: Stand by Assist;toilet transfer      Educated the Patient to role of occupational therapy, plan of care, goals of therapy and safety with mobility and ADLs, discharge instructions, home safety with verbalized understanding .     Patient left in bed with alarm and all other medical equipment in place and call bell and all personal items/needs within reach.  RN notified of session outcome.         Assessment:   Pt demonstrated excellent progress for ADL and mobility goals. Pt highly  motivated to perform tasks herself and needed encouragement to allow therapist to perform SUP for safety. Assist needed for LB bathing, but Pt reports at home she props her LE up while seated on shower chair. Max a to don socks at this time due to positioning by sink. Discussed d/c from OT caseload. Pt verbalized understanding.           PMP Activity: Step 6 - Walks in Room  Distance Walked (ft) (Step 6,7): 10 Feet      Plan:  OT Frequency Recommended: therapy discontinued  Goal Formulation:  Patient      Time For Goal Achievement: 5 visits  ADL Goals  Patient will groom self: Supervision, Goal met  Patient will dress lower body: Supervision, Not met  Patient will toilet: Supervision, Goal met  Mobility and Transfer Goals  Pt will perform functional transfers: Supervision, with rolling walker, Goal met                       Discharge from OT Acute Care Services.      Beverly Gust, OTR/L    Physical Medicine and Rehabilitation  [facility]  [phone]    06/02/2022  3:16 PM    [facility]  Patient: Shelby Barron MRN#: [id number]   Unit: [facility] 4 NORTH Bed: A4027/A4027.01

## 2022-06-02 NOTE — Progress Notes (Signed)
Hep labs and flow sheet from [facility] have been forwarded to [facility] admissions to complete the referral packet.     Gypsy Decant, HD Care Coordinator  [phone]

## 2022-06-02 NOTE — Progress Notes (Signed)
Office: [phone]  Epic GroupChat: "[facility])"    Date Time: 06/02/22 @NOW   Patient Name: Shelby Barron, Shelby Barron      Problem List:      Admission with abd pain  Recently recognized bladder cancer with extension to ureter  Pyuria/ hematuria              -- recent dx of "UTI" treated with levaquin    -- outpt ucx grew "staph species", not aureus              -- prior h/o enterococcal UTI (march 2024)                          -- amp sensitive   -- Ucx 5/8 10-30,000 urogenital flora. 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      Assessment:     Elderly lady with bladder cancer with extension into ureters with pyuria/ hematuria. No fever but has leucocytosis. It may be that the active urine sediment may be due to tumor but she is at risk for infection and has had recent abx, which may not make the current cx acurate.     She does have a prior h/o enterococcus in the urine in 03/2022. Also grew "staph species", not aureus, in her outpatient urine (discussed with Dr. Thad Ranger), sensitive to bactrim and levaquin, just prior to admission.     Has received a total of 7d for each of her prior enterococcus (vanc -> zosyn) and the CONS (vanc-> levaquin). Clinical significance of these bacterial cx remains in question, unclear if she has true UTI, as her pyuria may be due to her malignancy.     Today however is reporting significant  watery diarrhea, 5-6 bowel movements today. Concern for C Diff. Starting empiric therapy and testing. Stopping her abx as her urinary symptoms have improved.     Plan:     - Stopped levaquin  - Start PO Vanc empirically  - C Diff PCR sent    Monitoring clinical response and untoward effects of high risk/IV antimicrobials    Lines:     Patient Lines/Drains/Airways Status       Active PICC Line / CVC Line / PIV Line / Drain / Airway / Intraosseous Line / Epidural Line / ART Line / Line / Wound / Pressure Ulcer / NG/OG Tube       Name Placement date Placement time Site Days    Peripheral IV 05/29/22 20 G Right Antecubital 05/29/22  1620  Antecubital  1    Peritoneal Dialysis Catheter Mid lower abdomen 05/26/22  2000  -- 4                    *  I have performed a risk-benefit analysis and the patient needs a central line for access and IV medications      Review of Systems:   General ROS: negative for - chills, fevers, night sweats, weight loss   HEENT: negative for - blurry vision, sore throat, thrush   Respiratory ROS: negative for cough, SOB  Cardiovascular ROS: negative for - chest pain, palpitations   Gastrointestinal ROS: negative for - abdominal pain, nausea, vomiting, diarrhea  Genito-Urinary ROS: negative for - dysuria, urinary frequency/urgency   Musculoskeletal ROS: negative for - joint pain, joint stiffness or muscle pain   Dermatological ROS: negative for - rash and skin lesion changes   Neurological ROS: negative for - confusion, headache, dizziness  Hematological ROS: negative for - bruising, bleeding   Psychological ROS: negative for - changes in mood    Physical Exam:     Vitals:    06/02/22 0819   BP: 127/59   Pulse: 89   Resp:    Temp:    SpO2:        Awake, alert, no distress, cooperative.    HENT normal. Mouth moist, face symmetric  Lungs coarse breath sounds, without rales, wheezing  Cv normal without murmur, rub, or gallop  Abdomen soft nontender, PD cath site not inflamed  Extrem normal without  phlebitis or edema.   No rash.   Neurologic exam non-focal.   Normal mental status.         Family History:     Family History   Problem Relation Age of Onset    Cancer Mother     Heart disease Father     Cancer Maternal Aunt     Diabetes Maternal Aunt     Cancer Paternal Aunt        Social History:     Social History     Socioeconomic History    Marital status: Married     Spouse name: Not on file    Number of children: Not on file    Years of education: Not on file    Highest education level: Not on file   Occupational History    Not on file   Tobacco Use    Smoking status: Former    Smokeless tobacco: Never   Vaping Use    Vaping status: Never Used   Substance and Sexual Activity    Alcohol use: No    Drug use: No    Sexual activity: Not on file   Other Topics Concern    Not on file   Social History Narrative    Not on file     Social Determinants of Health     Financial Resource Strain: Not on file   Food Insecurity: No Food Insecurity (05/26/2022)    Hunger Vital Sign     Worried About Running Out of Food in the Last Year: Never true     Ran Out of Food in the Last Year: Never true   Transportation Needs: No Transportation Needs (05/26/2022)    PRAPARE - Therapist, art (Medical): No     Lack of Transportation (Non-Medical): No   Physical Activity: Not on file   Stress: Not on file   Social Connections: Unknown (02/01/2022)    [facility] from [facility]    Social Network     Social Network: Not on file   Intimate Partner Violence: Not At Risk (05/26/2022)    Humiliation, Afraid, Rape, and Kick questionnaire  Fear of Current or Ex-Partner: No     Emotionally Abused: No     Physically Abused: No     Sexually Abused: No   Housing Stability: Unknown (05/26/2022)    Housing Stability Vital Sign     Unable to Pay for Housing in the Last Year: No     Number of Places Lived in the Last Year: Not on file     Unstable Housing in the Last Year: No       Allergies:     Allergies   Allergen Reactions     Sulfa Antibiotics Hives       Labs:     Lab Results   Component Value Date    WBC 10.96 (H) 06/02/2022    HGB 8.9 (L) 06/02/2022    HCT 28.1 (L) 06/02/2022    MCV 97.6 (H) 06/02/2022    PLT 224 06/02/2022     Lab Results   Component Value Date    CREAT 7.6 (H) 06/02/2022     Lab Results   Component Value Date    ALT 13 05/26/2022    AST 18 05/26/2022    ALKPHOS 78 05/26/2022    BILITOTAL 0.6 05/26/2022     Lab Results   Component Value Date    LACTATE 2.0 05/26/2022       Microbiology:     Microbiology Results (last 15 days)       Procedure Component Value Units Date/Time    Culture + Gram Stain,Aerobic, Body Fluid [[id number]] Collected: 05/27/22 1321    Order Status: Completed Specimen: Body Fluid from Peritoneal Fluid (Abdominal) Updated: 05/31/22 1301    Narrative:      ORDER#: [id number]                                    ORDERED BY: Georgiana Shore  SOURCE: Peritoneal Fluid (Abdominal) peritoneal fluidCOLLECTED:  05/27/22 13:21  ANTIBIOTICS AT COLL.:                                RECEIVED :  05/27/22 16:33  Stain, Gram                                FINAL       05/27/22 18:12  05/27/22   Few WBCs             No organisms seen             Stain performed on Cytospin (concentrated) specimen  Culture and Gram Stain, Aerobic, Body FluidFINAL       05/31/22 13:01  05/31/22   No growth      Urine culture [[id number]] Collected: 05/26/22 2234    Order Status: Completed Specimen: Bladder Updated: 05/28/22 1742    Narrative:      ORDER#: [id number]                                    ORDERED BY: Pearletha Furl  SOURCE: Urine                                        COLLECTED:  05/26/22 22:34  ANTIBIOTICS AT COLL.:                                RECEIVED :  05/27/22 14:13  Culture Urine                              FINAL       05/28/22 17:42  05/28/22   10,000 - 30,000 CFU/MLof normal urogenital or skin microbiota, no             further work      Culture + Gram Stain,Aerobic, Body Fluid [[id number]]     Order Status: Canceled  Specimen: Body Fluid from Peritoneal Fluid (Abdominal)     Culture, Anaerobic Bacteria [[id number]]     Order Status: Sent Specimen: Other from Peritoneal Fluid (Abdominal)     Culture Blood Aerobic and Anaerobic [[id number]] Collected: 05/26/22 1604    Order Status: Completed Specimen: Arm from Blood, Venipuncture Updated: 05/31/22 2221    Narrative:      ORDER#: [id number]                                    ORDERED BY: Caroline More  SOURCE: Blood, Venipuncture Arm                      COLLECTED:  05/26/22 16:04  ANTIBIOTICS AT COLL.:                                RECEIVED :  05/26/22 19:56  Culture Blood Aerobic and Anaerobic        FINAL       05/31/22 22:21  05/31/22   No growth after 5 days of incubation.      COVID-19 (SARS-CoV-2) and Influenza A/B, NAA (Liat Rapid) [[id number]] Collected: 05/26/22 1535    Order Status: Completed Specimen: Culturette from Nasopharyngeal Updated: 05/26/22 1601     Purpose of COVID testing Diagnostic -PUI     SARS-CoV-2 Specimen Source Nasal Swab     SARS CoV 2 Overall Result Not Detected     Comment: __________________________________________________  -A result of "Detected" indicates POSITIVE for the    presence of SARS CoV-2 RNA  -A result of "Not Detected" indicates NEGATIVE for the    presence of SARS CoV-2 RNA  __________________________________________________________  Test performed using the Roche cobas Liat SARS-CoV-2 assay. This assay is  only for use under the Food and Drug Administration's Emergency Use  Authorization. This is a real-time RT-PCR assay for the qualitative  detection of SARS-CoV-2 RNA. Viral nucleic acids may persist in vivo,  independent of viability. Detection of viral nucleic acid does not imply the  presence of infectious virus, or that virus nucleic acid is the cause of  clinical symptoms. Negative results do not preclude SARS-CoV-2 infection and  should not be used as the sole basis for diagnosis, treatment or other  patient management  decisions. Negative results must be combined with  clinical observations, patient history, and/or epidemiological information.  Invalid results may be due to inhibiting substances in the specimen and  recollection should occur. Please see Fact Sheets for patients and providers  located:  WirelessDSLBlog.no          Influenza  A Not Detected     Influenza B Not Detected     Comment: Test performed using the Roche cobas Liat SARS-CoV-2 & Influenza A/B assay.  This assay is only for use under the Food and Drug Administration's  Emergency Use Authorization. This is a multiplex real-time RT-PCR assay  intended for the simultaneous in vitro qualitative detection and  differentiation of SARS-CoV-2, influenza A, and influenza B virus RNA. Viral  nucleic acids may persist in vivo, independent of viability. Detection of  viral nucleic acid does not imply the presence of infectious virus, or that  virus nucleic acid is the cause of clinical symptoms. Negative results do  not preclude SARS-CoV-2, influenza A, and/or influenza B infection and  should not be used as the sole basis for diagnosis, treatment or other  patient management decisions. Negative results must be combined with  clinical observations, patient history, and/or epidemiological information.  Invalid results may be due to inhibiting substances in the specimen and  recollection should occur. Please see Fact Sheets for patients and providers  located: http://www.rice.biz/.         Narrative:      o Collect and clearly label specimen type:  o PREFERRED-Upper respiratory specimen: One Nasal Swab in  Transport Media.  o Hand deliver to laboratory ASAP  Diagnostic -PUI    Culture Blood Aerobic and Anaerobic [[id number]] Collected: 05/26/22 1526    Order Status: Completed Specimen: Arm from Blood, Venipuncture Updated: 05/31/22 2221    Narrative:      ORDER#: [id number]                                    ORDERED BY: Caroline More  SOURCE: Blood, Venipuncture Arm                      COLLECTED:  05/26/22 15:26  ANTIBIOTICS AT COLL.:                                RECEIVED :  05/26/22 19:56  Culture Blood Aerobic and Anaerobic        FINAL       05/31/22 22:21  05/31/22   No growth after 5 days of incubation.      Culture Blood Aerobic and Anaerobic [[id number]]     Order Status: Canceled Specimen: Arm from Blood, Venipuncture             Rads:   No results found.    Thank you for this interesting consult. During the week, please reach out to author of the most recent ID note.     Signed by: Armen Pickup, MD

## 2022-06-02 NOTE — UM Notes (Signed)
Continued Stay Review 06/02/22:    PATIENT NAME: Shelby Barron, Shelby Barron   DOB: 01/21/1940     Adult Admit to Inpatient (Order #[id number]) on 05/26/22     Admit Diagnosis: SOB (shortness of breath) [R06.02]  Peritonitis [K65.9]  Facility: [facility]    Reason for Continued Stay  -82 year old female admitted to inpatient at [facility] on 5/8. Patient past medical history GERD, COPD, DMII, RCC s/p partial nephrectomy, gout, ESRD on PD, CVA, HTN, HLD, anemia, underwent cystoscopy on 04/19/22 in [place] and was found to have a large bladder tumor involving the trigone and bladder neck which was resected. She underwent staging CT scan on 04/29/2022, which showed moderate right hydroureteronephrosis, which is stable down to the level of the UVJ present for over a year. She had a low density right lower pole renal lesion and small pulmonary nodules measuring up to 5 mm; felt to be likely benign. The ultimate plan is for cystectomy and nephrectomy with formation of an [place] pouch, but patient needs cardiac clearance before surgery can be scheduled. Recently, patient began having severe pain with urination. She was placed on a 5 day course of Levaquin which she completed on 5/5. Patient presents with pain with urination, weakness and constant suprapubic and lower abdominal tenderness. CT in ED showed  new finding of grade 3 pronounced right -sided hydronephrosis and diffuse ureterectasis to the level of the right UVJ and several small masses of the right kidney.        Vital Signs  Temp 98.3  HR 103  RR 22  SPO2 94% on 6L  BP 162/68    Abnormal Labs   Latest Reference Range & Units 06/02/22 05:14   WBC 3.10 - 9.50 x10 3/uL 10.96 (H)   Hemoglobin 11.4 - 14.8 g/dL 8.9 (L)   Hematocrit 40.9 - 43.7 % 28.1 (L)   RBC 3.90 - 5.10 x10 6/uL 2.88 (L)   MCV 78.0 - 96.0 fL 97.6 (H)   Instrument Absolute Neutrophil Count 1.10 - 6.33 x10 3/uL 7.37 (H)   Monocytes Absolute Automated 0.21 - 0.85 x10 3/uL 1.42 (H)   Immature Granulocytes  Absolute 0.00 - 0.07 x10 3/uL 0.18 (H)   Nucleated RBC 0.0 - 0.0 /100 WBC 0.2 (H)   Neutrophils Absolute 1.10 - 6.33 x10 3/uL 7.37 (H)   Nucleated RBC Absolute 0.00 - 0.00 x10 3/uL 0.02 (H)   Glucose 70 - 100 mg/dL 811 (H)   Creatinine 0.4 - 1.0 mg/dL 7.6 (H)   eGFR >=91 YN/WGN/5.62 m2 4.9 !     Plan of Care  Per Medicine  Sepsis 2/2 UTI resolved   - lactic normal, leukocytosis improving   -Blood culture and urine culture remain negative  -Given patient failed outpatient Levaquin infectious disease consulted recommended to continue Zosyn over the weekend, so patient received cefepime for 3 days, Zosyn for 4 days and Levaquin for 1 day  Antibiotic discontinued on 5/15 because of diarrhea     Frequent watery diarrhea  Initially there was concern for constipation and patient was on stool softener which was discontinued on 5/14, this morning reported 2 episode of diarrhea for me and by mid-day patient reported 5 episodes of diarrhea for infectious disease doctor.  Given high risk for C. difficile ID discontinued antibiotic, ordered oral vancomycin and C. difficile test.  Of note no abdominal pain or leukocytosis in fact leukocytosis resolved.  If C. difficile test remain negative will [place] oral vancomycin and give him  more Imodium  Plan discussed with daughter question answered concern addressed     High grade urothelioma bladder CA   Obstructive uropathy with grade 3 hydronephrosis  RCC s/p partial nephrectomy    - Followed by Dr Celine Mans outpatient with plans for cystectomy and nephrectomy   - Urology consulted, since no pyelo on imaging likely nothing to do for worsening hyrdo this admission   - CTAP with grade 3 R sided hydronephrosis and ureterectasis to the R UVJ. Several small masses of the R kidney which may be solid masses vs complex cysts.  - pain controlled     Malfunctioning peritoneal dialysis portal  At this point peritoneal dialysis fairly working per nephrology and recommend to continue  Ordered abdominal  x-ray to see if it is kinked if not possible manipulation by IR as an outpatient.  Status post IR evaluation of the peritoneum post done on 05/31/2022 which is unsuccessful recommended surgical evaluation, nephrology planning to switch to hemodialysis  S/p tunneled dialysis cath done on 5/14.  Outpatient follow-up with nephrology.     COPD  Not in exacerbation   stable, chest x-ray unremarkable  Continue anoro ellipta & singulair per home dose  As needed albuterol for shortness of breath     HTN  HLD  H/o CVA  Continue metoprolol/atorvastatin     ESRD on PD  HAGMA 2/2 ESRD   nephrology consulted, continue PD as ordered  Continue nephrovite, sensipar, renvela  tunneled cath was placed on 5/14 because of the PD cath dysfunction  Outpatient dialysis seat arranged follow-up with nephrology once discharged.     DMII  A1c is 8.5   continue reduced dose lantus 10 U daily   low dose SSI  POC fairly controlled with the above regimen   carb consistent diet        GERD  Continue PPI     Gout  Continue allopurinol     Iron deficient anemia  Anemia of chronic disease  Continue iron supplement  Outpatient follow-up with PCP/nephrologist        Nutrition: renal diet    Per ID  - Stopped levaquin  - Start PO Vanc empirically  - C Diff PCR sent     Per Nephrology  S/p Hd yesterday with 1.1l net UF  HD tomorrow  Appreciate CM assistance with obtaining HD chair at [facility]   Continue to hold cinacalcet for now  Will add on phos and iron panel to AM labs   Plan to return to PD after surgery if possible       Medications  Current Facility-Administered Medications   Medication Dose Route Frequency Last Rate Last Admin    acetaminophen (TYLENOL) tablet 650 mg  650 mg Oral Q6H PRN   650 mg at 05/29/22 1155    albuterol-ipratropium (DUO-NEB) 2.5-0.5(3) mg/3 mL nebulizer 3 mL  3 mL Nebulization Q6H PRN        allopurinol (ZYLOPRIM) tablet 100 mg  100 mg Oral Daily   100 mg at 06/02/22 0819    atorvastatin (LIPITOR) tablet 40 mg  40 mg  Oral Daily   40 mg at 06/02/22 0819    b complex-vitamin c-folic acid (NEPHRO-VITE) tablet 1 tablet  1 tablet Oral Daily   1 tablet at 06/02/22 0818    benzocaine-menthol (CEPACOL/CHLORASEPTIC) lozenge 1 lozenge  1 lozenge Buccal Q2H PRN        benzonatate (TESSALON) capsule 100 mg  100 mg Oral TID PRN  carboxymethylcellulose (REFRESH TEARS) 0.5 % ophthalmic solution 1 drop  1 drop Both Eyes TID PRN        clopidogrel (PLAVIX) tablet 75 mg  75 mg Oral Daily   75 mg at 06/02/22 0819    dextrose (GLUCOSE) 40 % oral gel 15 g of glucose  15 g of glucose Oral PRN        Or    dextrose (D10W) 10% bolus 125 mL  12.5 g Intravenous PRN        Or    dextrose 50 % bolus 12.5 g  12.5 g Intravenous PRN        Or    glucagon (rDNA) (GLUCAGEN) injection 1 mg  1 mg Intramuscular PRN        diphenhydrAMINE (BENADRYL) capsule 25 mg  25 mg Oral QHS PRN   25 mg at 05/27/22 0127    heparin (porcine) injection 5,000 Units  5,000 Units Subcutaneous Q8H SCH   5,000 Units at 06/02/22 0602    HYDROmorphone (DILAUDID) injection 0.2 mg  0.2 mg Intravenous Q4H PRN   0.2 mg at 05/31/22 0914    hyoscyamine (LEVSIN) tablet 0.125 mg  0.125 mg Oral Q4H PRN   0.125 mg at 06/02/22 0602    insulin glargine (LANTUS) injection 10 Units  10 Units Subcutaneous QAM   10 Units at 06/02/22 0824    insulin lispro injection 1-3 Units  1-3 Units Subcutaneous QHS   1 Units at 05/30/22 2239    insulin lispro injection 1-5 Units  1-5 Units Subcutaneous TID AC   2 Units at 05/30/22 1610    iron polysaccharides (FERREX 150 / NIFEREX) capsule 150 mg  150 mg Oral Daily   150 mg at 06/01/22 1054    lactobacillus/streptococcus (RISAQUAD) capsule 1 capsule  1 capsule Oral Daily   1 capsule at 06/02/22 0819    levoFLOXacin (LEVAQUIN) tablet 250 mg  250 mg Oral Daily   250 mg at 06/02/22 0824    loperamide (IMODIUM) capsule 2 mg  2 mg Oral 4X Daily PRN        melatonin tablet 3 mg  3 mg Oral QHS PRN        metoprolol tartrate (LOPRESSOR) half tablet 12.5 mg  12.5 mg  Oral Daily   12.5 mg at 06/02/22 0819    montelukast (SINGULAIR) tablet 10 mg  10 mg Oral QHS   10 mg at 06/01/22 2055    naloxone (NARCAN) injection 0.2 mg  0.2 mg Intravenous PRN        ondansetron (ZOFRAN-ODT) disintegrating tablet 4 mg  4 mg Oral Q6H PRN   4 mg at 05/29/22 0222    Or    ondansetron (ZOFRAN) injection 4 mg  4 mg Intravenous Q6H PRN   4 mg at 06/01/22 2328    ondansetron (ZOFRAN) injection 4 mg  4 mg Intravenous Once        ondansetron (ZOFRAN) injection 4 mg  4 mg Intravenous Once        oxybutynin XL (DITROPAN-XL) 24 hr tablet 5 mg  5 mg Oral Daily   5 mg at 06/01/22 1050    oxyCODONE (ROXICODONE) immediate release tablet 5 mg  5 mg Oral Q6H PRN   5 mg at 06/02/22 0614    pantoprazole (PROTONIX) EC tablet 40 mg  40 mg Oral Q12H   40 mg at 06/02/22 0830    prochlorperazine (COMPAZINE) injection 5 mg  5 mg Intravenous Once  saline (OCEAN NASAL SPRAY) 0.65 % nasal solution 2 spray  2 spray Each Nare Q4H PRN        sevelamer carbonate (RENVELA) tablet 1,600 mg  1,600 mg Oral BID AC   1,600 mg at 06/02/22 0819    umeclidinium-vilanterol (ANORO ELLIPTA) 62.5-25 MCG/ACT 1 puff  1 puff Inhalation QAM   1 puff at 06/01/22 1040       Primary Coverage:  Payor: MEDICARE [employer] / Plan: UHC MED ADV [id number] / Product Type: MANAGED MEDICARE /     UTILIZATION REVIEW CONTACT: Wendall Stade RN, BSN  Utilization Review   [facility]  [phone]   [phone]  Email: [email or web address]  Tax ID:  [id number]         NOTES TO REVIEWER:    This clinical review is based on/compiled from documentation provided by the treatment team within the patient's medical record.

## 2022-06-02 NOTE — Progress Notes (Signed)
LOS # 7      Summary of Discharge Plan: Home with HH PT/OT/SN and OP HD [facility] 12:45 PM      Identified Possible Discharge Barriers:medical condition.      CM Interventions and Outcome: CM met and reviewed D/C plan and transportation responsibilities with patient and [name] (dtr) at bedside. Pt has transportation benefits from her current insurance. [name] requested for CM to set up stretcher transportation upon D/C and first 3 appointments OP HD.       Discussed above Discharge Plan with (patient, family, Care Team, others):yes      Case Management will continue to following on patient's discharge needs.     Natasha Bence, RN, BSN, CM  RN Care Manager I  [facility]  [phone]

## 2022-06-02 NOTE — Plan of Care (Signed)
Patient reporting pain to new access site to right chest. Relieved with oxycodone.   Also reporting lower abdominal pain and cramping related to diarrhea x4. Probiotic initiated this evening. Hospitalist group contacted and ordered 1x dose of Bentyl. Given with relief.    Problem: Pain interferes with ability to perform ADL  Goal: Pain at adequate level as identified by patient  Outcome: Progressing  Flowsheets (Taken 06/02/2022 0351)  Pain at adequate level as identified by patient:   Identify patient comfort function goal   Assess pain on admission, during daily assessment and/or before any "as needed" intervention(s)   Reassess pain within 30-60 minutes of any procedure/intervention, per Pain Assessment, Intervention, Reassessment (AIR) Cycle   Evaluate patient's satisfaction with pain management progress     Problem: Patient Receiving Advanced Renal Therapies  Goal: Therapy access site remains intact  Outcome: Progressing  Flowsheets (Taken 06/02/2022 0351)  Therapy access site remains intact: Assess therapy access site     Problem: Fluid and Electrolyte Imbalance/ Endocrine  Goal: Fluid and electrolyte balance are achieved/maintained  Outcome: Progressing  Flowsheets (Taken 06/01/2022 1520 by Deyasso, Dirshaye A, RN)  Fluid and electrolyte balance are achieved/maintained:   Monitor for muscle weakness   Monitor/assess lab values and report abnormal values   Assess and reassess fluid and electrolyte status   Observe for cardiac arrhythmias

## 2022-06-03 LAB — WHOLE BLOOD GLUCOSE POCT
Whole Blood Glucose POCT: 147 mg/dL — ABNORMAL HIGH (ref 70–100)
Whole Blood Glucose POCT: 108 mg/dL — ABNORMAL HIGH (ref 70–100)
Whole Blood Glucose POCT: 131 mg/dL — ABNORMAL HIGH (ref 70–100)
Whole Blood Glucose POCT: 121 mg/dL — ABNORMAL HIGH (ref 70–100)

## 2022-06-03 LAB — CBC AND DIFFERENTIAL
Absolute NRBC: 0 10*3/uL (ref 0.00–0.00)
Basophils Absolute Automated: 0.06 10*3/uL (ref 0.00–0.08)
Basophils Automated: 0.5 %
Eosinophils Absolute Automated: 0.01 10*3/uL (ref 0.00–0.44)
Eosinophils Automated: 0.1 %
Hematocrit: 26.8 % — ABNORMAL LOW (ref 34.7–43.7)
Hgb: 8.6 g/dL — ABNORMAL LOW (ref 11.4–14.8)
Immature Granulocytes Absolute: 0.2 10*3/uL — ABNORMAL HIGH (ref 0.00–0.07)
Immature Granulocytes: 1.8 %
Instrument Absolute Neutrophil Count: 7.4 10*3/uL — ABNORMAL HIGH (ref 1.10–6.33)
Lymphocytes Absolute Automated: 2.31 10*3/uL (ref 0.42–3.22)
Lymphocytes Automated: 20.2 %
MCH: 31.3 pg (ref 25.1–33.5)
MCHC: 32.1 g/dL (ref 31.5–35.8)
MCV: 97.5 fL — ABNORMAL HIGH (ref 78.0–96.0)
MPV: 9.1 fL (ref 8.9–12.5)
Monocytes Absolute Automated: 1.44 10*3/uL — ABNORMAL HIGH (ref 0.21–0.85)
Monocytes: 12.6 %
Neutrophils Absolute: 7.4 10*3/uL — ABNORMAL HIGH (ref 1.10–6.33)
Neutrophils: 64.8 %
Nucleated RBC: 0 /100 WBC (ref 0.0–0.0)
Platelets: 196 10*3/uL (ref 142–346)
RBC: 2.75 10*6/uL — ABNORMAL LOW (ref 3.90–5.10)
RDW: 15 % (ref 11–15)
WBC: 11.42 10*3/uL — ABNORMAL HIGH (ref 3.10–9.50)

## 2022-06-03 LAB — BASIC METABOLIC PANEL
Anion Gap: 11 (ref 5.0–15.0)
BUN: 25 mg/dL — ABNORMAL HIGH (ref 7.0–21.0)
CO2: 25 mEq/L (ref 17–29)
Calcium: 8.4 mg/dL (ref 7.9–10.2)
Chloride: 101 mEq/L (ref 99–111)
Creatinine: 9.4 mg/dL — ABNORMAL HIGH (ref 0.4–1.0)
Glucose: 147 mg/dL — ABNORMAL HIGH (ref 70–100)
Potassium: 3.6 mEq/L (ref 3.5–5.3)
Sodium: 137 mEq/L (ref 135–145)
eGFR: 3.8 mL/min/{1.73_m2} — AB (ref >=60)

## 2022-06-03 MED ORDER — SODIUM CHLORIDE 0.9 % IV BOLUS
250.0000 mL | INTRAVENOUS | Status: AC | PRN
Start: 2022-06-03 — End: 2022-06-03

## 2022-06-03 MED ORDER — SODIUM CHLORIDE 0.9 % IV BOLUS
100.0000 mL | INTRAVENOUS | Status: AC | PRN
Start: 2022-06-03 — End: 2022-06-03

## 2022-06-03 MED ORDER — ALBUMIN HUMAN 25 % IV SOLN
100.0000 mL | INTRAVENOUS | Status: AC | PRN
Start: 2022-06-03 — End: 2022-06-03

## 2022-06-03 NOTE — Progress Notes (Signed)
Arrived in room, pt is contact isolation.  PPE done per protocol.  Pt is alert and oriented.  Pt c/o abdominal pain. L permanent catheter access is optimally working. Report received from primary RN pre-dialysis. Timeouts and safety checks done. Will closely monitor the pt.       06/03/22 0942   Bedside Nurse Communication   Name of bedside RN - pre dialysis Renard Matter, RN   Treatment Initiation- With Dialysis Precautions   Time Out/Safety Check Completed Yes   Consent for HD signed for this hospitalization (Date) 06/01/22   Consent for HD signed for this hospitalization (Time) 1516   Blood Consent Verified N/A   Dialysis Precautions All Connections Secured;Saline Line Double Clamped;Venous Parameters Set;Arterial Parameters Set;Air Foam Detecctor Engaged   Dialysis Treatment Type Bedside;Routine   Special Considerations Contact isolation   Is patient diabetic? Yes   RO/Hemodialysis Cabin crew   Is Total Chlorine less than 0.1 ppm? Yes   Orignial Total Chlorine Testing Time 0915   At 4 Hour Total Chlorine Testing Time 1315   RO/Hemodialysis Arts development officer Number 3    Machine Serial Number [id number]   RO # 21   RO Serial # [id number]   pH 7.2   Pressure Test Verified Yes   Alarms Verified Passed   Machine Temperature 98.6 F (37 C)   Alarms Verified Yes   Na+ mEq (Machine) 138 mEq   Bicarb mEq (Machine) 35 mEq   Hemodialysis Conductivity (Machine) 13.8   Hemodialysis Conductivity (Meter) 13.8   Dialyzer Lot Number [id number]   Tubing Lot Number [id number]   RO Machine Log Completed Yes   Hepatitis Status   HBsAg (Antigen) Result Negative   HBsAg Date Drawn 06/01/22   HBsAg Repeat Draw Due Date 06/29/22   Dialysis Weight   Pre-Treatment Weight (Kg) 90.2   Scale Type ICU Bed Scale   Vitals   Temp 97.5 F (36.4 C)   Heart Rate 76   Resp Rate 18   BP 123/78   SpO2 100 %   O2 Device None (Room air)   Assessment   Mental Status Alert;Oriented   Respiratory Pattern Return to WDL   Bilateral Breath  Sounds Diminished   R Breath Sounds Diminished   L Breath Sounds Diminished   Edema  WDL   General Skin Color Appropriate for ethnicity   Skin Condition/Temp Return to [facility]   Abdomen Inspection Rounded   GI Conditions Loss of appetite   Mobility Bed   Permacath Catheter - Tunneled 06/01/22 Internal Jugular Right   Placement Date/Time: 06/01/22 0956   Inserted by: Dr. Arlyce Dice  Access Type: Internal Jugular  Orientation: Right  Central Line Infection Prevention Education provided?: Yes  Hand Hygiene: Alcohol based hand scrub;Soap and water  Line cart used?: No  Li...   Line necessity reviewed? Apheresis/hemodialysis   Site Assessment Clean;Dry;Intact   Catheter Lumen Volume Venous 1.7 mL   Catheter Lumen Volume Arterial 1.7 mL   Dressing Status and Intervention Dressing Intact;Clean & Dry   Tego/Curos Caps on Catheter Yes   Line Care Connections checked and tightened   Dressing Type Transparent;Biopatch   Dressing Status Clean;Dry;Intact   Dressing Change Due 06/07/22   Pain Assessment   Charting Type Assessment   Pain Scale Used Numeric Scale (0-10)   Numeric Pain Scale   Pain Score 8   POSS Score 1   Pain Location Abdomen   Hemodialysis Comments   Pre-Hemodialysis Comments Timeout  and safety checks done

## 2022-06-03 NOTE — Progress Notes (Signed)
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 - OP urology follow up for surgery in the future [facility] [place] [facility])      Recommendations:     HD today  Stable from renal standpoint  Will follow in outpt HD unit    Case discussed with: pt      Alla German, MD  [facility]  [id number] (office)      Subjective:     No further diarrhea but has some cramping    Review of Systems:   No cp      Physical Exam:     Vitals:    06/02/22 1516 06/02/22 1926 06/02/22 2323 06/03/22 0500   BP: 110/64 120/69 136/65 126/67   Pulse: 72 82 98 80   Resp: 15 22 18 18    Temp: 97.7 F (36.5 C) 97.7 F (36.5 C) 97.5 F (36.4 C) 97.6 F (36.4 C)   TempSrc: Oral Oral Oral Oral   SpO2: 97% 96% 100% 100%   Weight:       Height:           Intake and Output Summary (Last 24 hours) at Date Time    Intake/Output Summary (Last 24 hours) at 06/03/2022 0620  Last data filed at 06/02/2022 1202  Gross per 24 hour   Intake 390 ml   Output --   Net 390 ml       General: awake, alert, no acute distress  Cardiovascular: regular rate and rhythm  Lungs: clear to auscultation bilaterally  Abdomen: soft, NT, ND  Extremities: no edema    Access: R TDC      Meds:      Scheduled Meds: PRN Meds:    allopurinol, 100 mg, Oral, Daily  atorvastatin, 40 mg, Oral, Daily  b complex-vitamin c-folic acid, 1 tablet, Oral, Daily  clopidogrel, 75 mg, Oral, Daily  heparin (porcine), 5,000 Units, Subcutaneous, Q8H SCH  insulin glargine, 10 Units, Subcutaneous, QAM  insulin lispro, 1-3 Units, Subcutaneous, QHS  insulin lispro,  1-5 Units, Subcutaneous, TID AC  iron polysaccharides, 150 mg, Oral, Daily  lactobacillus/streptococcus, 1 capsule, Oral, Daily  metoprolol tartrate, 12.5 mg, Oral, Daily  montelukast, 10 mg, Oral, QHS  ondansetron, 4 mg, Intravenous, Once  ondansetron, 4 mg, Intravenous, Once  oxybutynin XL, 5 mg, Oral, Daily  pantoprazole, 40 mg, Oral, Q12H  prochlorperazine, 5 mg, Intravenous, Once  sevelamer carbonate, 1,600 mg, Oral, BID AC  umeclidinium-vilanterol, 1 puff, Inhalation, QAM  vancomycin, 125 mg, Oral, 4 times per day          Continuous Infusions:     acetaminophen, 650 mg, Q6H PRN  albuterol-ipratropium, 3 mL, Q6H PRN  benzocaine-menthol, 1 lozenge, Q2H PRN  benzonatate, 100 mg, TID PRN  carboxymethylcellulose sodium, 1 drop, TID PRN  dextrose, 15 g of glucose, PRN   Or  dextrose, 12.5 g, PRN   Or  dextrose, 12.5 g, PRN   Or  glucagon (rDNA), 1 mg,  PRN  diphenhydrAMINE, 25 mg, QHS PRN  HYDROmorphone, 0.2 mg, Q4H PRN  hyoscyamine, 0.125 mg, Q4H PRN  melatonin, 3 mg, QHS PRN  naloxone, 0.2 mg, PRN  ondansetron, 4 mg, Q6H PRN   Or  ondansetron, 4 mg, Q6H PRN  oxyCODONE, 5 mg, Q6H PRN  saline, 2 spray, Q4H PRN              Labs:     Recent Labs   Lab 06/03/22  0423 06/02/22  0514 06/01/22  0315   WBC 11.42* 10.96* 12.58*   Hgb 8.6* 8.9* 9.7*   Hematocrit 26.8* 28.1* 29.4*   Platelets 196 224 259     Recent Labs   Lab 06/03/22  0423 06/02/22  0514 06/01/22  0315 05/31/22  0515 05/30/22  0453   Sodium 137 140 139  More results in Results Review 139   Potassium 3.6 3.6 3.8  More results in Results Review 3.8   Chloride 101 102 98*  More results in Results Review 98*   CO2 25 25 24   More results in Results Review 24   BUN 25.0* 19.0 48.0*  More results in Results Review 50.0*   Creatinine 9.4* 7.6* 14.2*  More results in Results Review 14.3*   Calcium 8.4 8.4 8.7  More results in Results Review 8.7   Phosphorus  --  4.2 6.8*  --  6.2*   Magnesium  --   --  2.2  --  2.3   Glucose 147* 117* 130*  More results in Results  Review 111*   eGFR 3.8* 4.9* 2.3*  More results in Results Review 2.3*   More results in Results Review = values in this interval not displayed.             Invalid input(s): "LEUKOCYTESUR"          No results found.        Signed by: Alla German, MD

## 2022-06-03 NOTE — Plan of Care (Signed)
NURSING SHIFT NOTE     Patient: Shelby Barron  Day: 8      SHIFT EVENTS     Shift Narrative/Significant Events (PRN med administration, fall, RRT, etc.):     Pt is pleasant 82 yo Female. Alert and Oriented x4. VSS on room air. Pt received hemodialysis today in the room and removed 1.5L. Given PRN pain medication one time during shift with good effect. Blood sugars controled and no insulin given. Daughter at bedside. All other needs expressed and met.     Safety and fall precautions remain in place. Purposeful rounding completed.          ASSESSMENT     Changes in assessment from patient's baseline this shift:    Neuro: No  CV: No  Pulm: No  Peripheral Vascular: No  HEENT: No  GI: No  BM during shift: No, Last BM: Last BM Date: 06/03/22  GU: No   Integ: No  MS: No    Pain: Improved  Pain Interventions: Medications  Medications Utilized: Oxycodone    Mobility: PMP Activity: Step 6 - Walks in Room of Distance Walked (ft) (Step 6,7): 10 Feet           Lines     Patient Lines/Drains/Airways Status       Active Lines, Drains and Airways       Name Placement date Placement time Site Days    Permacath Catheter - Tunneled 06/01/22 Internal Jugular Right 06/01/22  0956  -- 2    Peripheral IV 05/29/22 20 G Right Antecubital 05/29/22  1620  Antecubital  5    Peritoneal Dialysis Catheter Mid lower abdomen 05/26/22  2000  -- 7                         VITAL SIGNS     Vitals:    06/03/22 1440   BP: 114/66   Pulse: 82   Resp: 16   Temp: 97.5 F (36.4 C)   SpO2: 99%       Temp  Min: 97.4 F (36.3 C)  Max: 97.7 F (36.5 C)  Pulse  Min: 61  Max: 98  Resp  Min: 16  Max: 22  BP  Min: 107/57  Max: 145/73  SpO2  Min: 95 %  Max: 100 %      Intake/Output Summary (Last 24 hours) at 06/03/2022 1654  Last data filed at 06/03/2022 1256  Gross per 24 hour   Intake 120 ml   Output 1500 ml   Net -1380 ml            Problem: Pain interferes with ability to perform ADL  Goal: Pain at adequate level as identified by patient  Outcome:  Progressing  Flowsheets (Taken 06/02/2022 1804 by Corrie Mckusick, RN)  Pain at adequate level as identified by patient:   Identify patient comfort function goal   Assess for risk of opioid induced respiratory depression, including snoring/sleep apnea. Alert healthcare team of risk factors identified.   Assess pain on admission, during daily assessment and/or before any "as needed" intervention(s)   Reassess pain within 30-60 minutes of any procedure/intervention, per Pain Assessment, Intervention, Reassessment (AIR) Cycle   Evaluate if patient comfort function goal is met   Evaluate patient's satisfaction with pain management progress   Offer non-pharmacological pain management interventions     Problem: Side Effects from Pain Analgesia  Goal: Patient will experience minimal side effects of analgesic  therapy  Outcome: Progressing  Flowsheets (Taken 06/02/2022 1804 by Corrie Mckusick, RN)  Patient will experience minimal side effects of analgesic therapy:   Monitor/assess patient's respiratory status (RR depth, effort, breath sounds)   Assess for changes in cognitive function   Prevent/manage side effects per LIP orders (i.e. nausea, vomiting, pruritus, constipation, urinary retention, etc.)   Evaluate for opioid-induced sedation with appropriate assessment tool (i.e. POSS)     Problem: Moderate/High Fall Risk Score >5  Goal: Patient will remain free of falls  Outcome: Progressing  Flowsheets (Taken 06/03/2022 0913)  Moderate Risk (6-13):   MOD-Consider activation of bed alarm if appropriate   MOD-Apply bed exit alarm if patient is confused   MOD-Floor mat at bedside (where available) if appropriate   MOD-Consider a move closer to Nurses Station   MOD-Remain with patient during toileting   MOD-Place bedside commode and assistive devices out of sight when not in use   MOD-Re-orient confused patients   MOD-Utilize diversion activities   MOD-Perform dangle, stand, walk (DSW) prior to mobilization   MOD-Request  PT/OT consult order for patients with gait/mobility impairment   MOD-Use gait belt when appropriate   MOD-include family in multidisciplinary POC discussions   MOD- Consider video monitoring     Problem: Renal Instability  Goal: Fluid and electrolyte balance are achieved/maintained  Outcome: Progressing  Flowsheets (Taken 06/03/2022 1005 by Bunnie Philips, RN)  Fluid and electrolyte balance are achieved/maintained:   Monitor for muscle weakness   Follow fluid restrictions/IV/PO parameters   Assess and reassess fluid and electrolyte status   Monitor/assess lab values and report abnormal values     Problem: Patient Receiving Advanced Renal Therapies  Goal: Therapy access site remains intact  Outcome: Progressing  Flowsheets (Taken 06/03/2022 1005 by Bunnie Philips, RN)  Therapy access site remains intact:   Assess therapy access site   Change therapy access site dressing as needed     Problem: Compromised Activity/Mobility  Goal: Activity/Mobility Interventions  Outcome: Progressing  Flowsheets (Taken 06/03/2022 0913)  Activity/Mobility Interventions: Pad bony prominences, TAP Seated positioning system when OOB, Promote PMP, Reposition q 2 hrs / turn clock, Offload heels     Problem: Fluid and Electrolyte Imbalance/ Endocrine  Goal: Fluid and electrolyte balance are achieved/maintained  Outcome: Progressing  Goal: Adequate hydration  Outcome: Progressing     Problem: Compromised Sensory Perception  Goal: Sensory Perception Interventions  Outcome: Progressing     Problem: Compromised Moisture  Goal: Moisture level Interventions  Outcome: Progressing     Problem: Compromised Friction/Shear  Goal: Friction and Shear Interventions  Outcome: Progressing     Problem: Compromised Nutrition  Goal: Nutrition Interventions  Outcome: Progressing

## 2022-06-03 NOTE — Progress Notes (Signed)
[facility] has been notified that pt did not d/c yesterday.  Writer spoke with RN @ [facility], SOC has been pushed out to Sat 5/18.     Gypsy Decant, HD Care Coordinator  [phone]

## 2022-06-03 NOTE — Progress Notes (Signed)
[facility]  PROGRESS NOTE      Patient: Shelby Barron  Date: 06/03/2022   LOS: 8 Days  Admission Date: 05/26/2022   MRN: [id number]  Attending: Gayleen Orem, MD  When on service as the attending, please contact me on Epic Secure Chat from 7 AM - 7 PM for non-urgent issues. For urgent matters use XTend page from 7 AM - 7 PM.       ASSESSMENT/PLAN     Shelby Barron is a 82 y.o. female admitted with Peritonitis    Interval Summary: 82 y.o. 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   Patient was sent to the ER by her nephrologist for dysuria and suprapubic pain peristent despite 5d course of Levaquin.  CT showing new finding of grade 3 pronounced right -sided hydronephrosis and diffuse ureterectasis to the level of the right UVJ and several small masses of the right kidney. Urology consulted and did not recommend any acute intervention this admission for worsening hydro, just continue treatment with empiric antibiotics for UTI.  Stay complicated with frequent watery diarrhea ID would like to rule out C. difficile and started p.o. vancomycin and ordered C. difficile test.    5/15  Patient reported initially this morning 2 episode of watery diarrhea.  By midday she had another 3 episode.  Stool softener has been discontinued  No significant abdominal pain or fever  ID would like to C. difficile infection.  5/16  Since C. difficile order placed in no further diarrhea reported so C. difficile is not done  Test discontinued, vancomycin discontinued will watch her for today  Discussed with the patient as well as case coordinator to facilitate early discharge for tomorrow  I have discussed with the daughter as well.    Patient Active Hospital Problem List:  Sepsis 2/2 UTI  resolved   - lactic normal, leukocytosis improving   -Blood culture and urine culture remain negative  -Given patient failed outpatient Levaquin infectious disease consulted recommended to continue Zosyn over the weekend, so patient received cefepime for 3 days, Zosyn for 4 days and Levaquin for 1 day  Antibiotic discontinued on 5/15 because of diarrhea    Frequent watery diarrhea, resolved  Initially there was concern for constipation and patient was on stool softener which was discontinued on 5/14, this morning reported 2 episode of diarrhea for me and by mid-day patient reported 5 episodes of diarrhea for infectious disease doctor.  Given high risk for C. difficile ID discontinued antibiotic, ordered oral vancomycin and C. difficile test.  Since C. difficile test ordered no diarrhea reported, C. difficile test discontinued 5/16.  ID recommended to discontinue vancomycin as well.  Less likely to be C. difficile.      High grade urothelioma bladder CA   Obstructive uropathy with grade 3 hydronephrosis  RCC s/p partial nephrectomy    - Followed by Dr Celine Mans outpatient with plans for cystectomy and nephrectomy   - Urology consulted, since no pyelo on imaging likely nothing to do for worsening hyrdo this admission   - CTAP with grade 3 R sided hydronephrosis and ureterectasis to the R UVJ. Several small masses of the R kidney which may be solid masses vs complex cysts.  Outpatient follow-up with  her urologist    Malfunctioning peritoneal dialysis portal  At this point peritoneal dialysis fairly working per nephrology and recommend to continue  Ordered abdominal x-ray to see if it is kinked if not possible manipulation by IR as an outpatient.  Status post IR evaluation of the peritoneum post done on 05/31/2022 which is unsuccessful recommended surgical evaluation, nephrology planning to switch to hemodialysis  S/p tunneled dialysis cath done on 5/14.  Outpatient follow-up with nephrology.    COPD  Not in exacerbation    stable, chest x-ray unremarkable  Continue anoro ellipta & singulair per home dose  As needed albuterol for shortness of breath    HTN  HLD  H/o CVA  Continue metoprolol/atorvastatin  Outpatient follow-up with PCP    ESRD on PD  HAGMA 2/2 ESRD   nephrology consulted, continue PD as ordered  Continue nephrovite, sensipar, renvela  tunneled cath was placed on 5/14 because of the PD cath dysfunction  Outpatient dialysis seat arranged follow-up with nephrology once discharged.    DMII  A1c is 8.5   continue reduced dose lantus 10 U daily   low dose SSI  POC fairly controlled with the above regimen   carb consistent diet  Outpatient follow-up with PCP     GERD  Continue PPI     Gout  Continue allopurinol     Iron deficient anemia  Anemia of chronic disease  Continue iron supplement  Outpatient follow-up with PCP/nephrologist      Nutrition: renal diet  Malnutrition Documentation    Severe Malnutrition related to inadequate oral intake in the setting of acute illness as evidenced by Patient meeting </= 50% of estimated energy requirements for >/= 5 days and 4.8% weight loss x 1 week          Patient has BMI=Body mass index is 33.91 kg/m.  Diagnosis: Obesity based on BMI criteria                       Recent Labs   Lab 06/03/22  0423 06/02/22  0514 06/01/22  0315   Hgb 8.6* 8.9* 9.7*   Hematocrit 26.8* 28.1* 29.4*   MCV 97.5* 97.6* 95.8   WBC 11.42* 10.96* 12.58*   Platelets 196 224 259     Recent Labs   Lab 06/02/22  0514   Iron 62   TIBC 145*   Iron Saturation 43     Anemia Diagnosis: Chronic: Anemia of Chronic Kidney Disease    Code Status: Full Code    Dispo: New onset diarrhea evaluation and treatment  Family Contact: Updated daughter over the phone  DVT Prophylaxis:   Current Facility-Administered Medications (Includes Only Anticoagulants, Misc. Hematological)   Medication Dose Route Last Admin    clopidogrel (PLAVIX) tablet 75 mg  75 mg Oral 75 mg at 06/03/22 0914    heparin (porcine) injection 5,000 Units  5,000  Units Subcutaneous 5,000 Units at 06/03/22 0510          CHART  REVIEW & DISCUSSION     The following chart items were reviewed as of 3:51 PM on 06/03/22:  [x]  Lab Results [x]  Imaging Results   []  Problem List  [x]  Current Orders [x]  Current Medications  []  Allergies  [x]  Code Status [x]  Previous Notes   []  SDoH    The management and plan of care for this patient was discussed with the following specialty consultants:  []  Cardiology  []  Gastroenterology                 [  x] Infectious Disease  []  Pulmonology []  Neurology                [x]  Nephrology  []  Neurosurgery []  Orthopedic Surgery  []  Heme/Onc  []  General Surgery []  Psychiatry                                   [x]  Urology    SUBJECTIVE     Shelby Barron   Diarrhea resolved  Patient reported nonspecific pain and discomfort  Other than that no significant events reported  Denies chest pain, fever, chills or diarrhea    MEDICATIONS     Current Facility-Administered Medications   Medication Dose Route Frequency    allopurinol  100 mg Oral Daily    atorvastatin  40 mg Oral Daily    b complex-vitamin c-folic acid  1 tablet Oral Daily    clopidogrel  75 mg Oral Daily    heparin (porcine)  5,000 Units Subcutaneous Q8H SCH    insulin glargine  10 Units Subcutaneous QAM    insulin lispro  1-3 Units Subcutaneous QHS    insulin lispro  1-5 Units Subcutaneous TID AC    iron polysaccharides  150 mg Oral Daily    lactobacillus/streptococcus  1 capsule Oral Daily    metoprolol tartrate  12.5 mg Oral Daily    montelukast  10 mg Oral QHS    ondansetron  4 mg Intravenous Once    ondansetron  4 mg Intravenous Once    oxybutynin XL  5 mg Oral Daily    pantoprazole  40 mg Oral Q12H    prochlorperazine  5 mg Intravenous Once    sevelamer carbonate  1,600 mg Oral BID AC    umeclidinium-vilanterol  1 puff Inhalation QAM       PHYSICAL EXAM     Vitals:    06/03/22 1440   BP: 114/66   Pulse: 82   Resp: 16   Temp: 97.5 F (36.4 C)   SpO2: 99%       Temperature: Temp  Min: 97.4 F  (36.3 C)  Max: 97.7 F (36.5 C)  Pulse: Pulse  Min: 61  Max: 98  Respiratory: Resp  Min: 16  Max: 22  Non-Invasive BP: BP  Min: 107/57  Max: 145/73  Pulse Oximetry SpO2  Min: 95 %  Max: 100 %    Intake and Output Summary (Last 24 hours) at Date Time    Intake/Output Summary (Last 24 hours) at 06/03/2022 1551  Last data filed at 06/03/2022 1256  Gross per 24 hour   Intake 120 ml   Output 1500 ml   Net -1380 ml             GEN APPEARANCE: Normal;  A&OX3  HEENT: PERLA; EOMI; Conjunctiva Clear  NECK: Supple; No bruits  CVS: RRR, S1, S2; No M/G/R  LUNGS: CTAB; No Wheezes; No Rhonchi: No rales  ABD: Soft; nontender; + Normoactive BS  EXT: No edema; Pulses 2+ and intact  NEURO: CN 2-12 intact; No Focal neurological deficits  MENTAL STATUS: Conversant and cooperative    LABS     Recent Labs   Lab 06/03/22  0423 06/02/22  0514 06/01/22  0315   WBC 11.42* 10.96* 12.58*   RBC 2.75* 2.88* 3.07*   Hgb 8.6* 8.9* 9.7*   Hematocrit 26.8* 28.1* 29.4*   MCV 97.5* 97.6* 95.8   Platelets 196 224  259       Recent Labs   Lab 06/03/22  0423 06/02/22  0514 06/01/22  0315 05/31/22  0515 05/30/22  0453   Sodium 137 140 139 139 139   Potassium 3.6 3.6 3.8 4.0 3.8   Chloride 101 102 98* 97* 98*   CO2 25 25 24 24 24    BUN 25.0* 19.0 48.0* 44.0* 50.0*   Creatinine 9.4* 7.6* 14.2* 13.6* 14.3*   Glucose 147* 117* 130* 185* 111*   Calcium 8.4 8.4 8.7 8.9 8.7   Magnesium  --   --  2.2  --  2.3                       Recent Labs   Lab 06/01/22  0315   PT INR 1.1   PT 12.7   PTT 27       Microbiology Results (last 15 days)       Procedure Component Value Units Date/Time    Clostridium difficile toxin B PCR [[id number]]     Order Status: Canceled Specimen: Stool     Culture + Gram Stain,Aerobic, Body Fluid [[id number]] Collected: 05/27/22 1321    Order Status: Completed Specimen: Body Fluid from Peritoneal Fluid (Abdominal) Updated: 05/31/22 1301    Narrative:      ORDER#: [id number]                                    ORDERED BY: Georgiana Shore  SOURCE:  Peritoneal Fluid (Abdominal) peritoneal fluidCOLLECTED:  05/27/22 13:21  ANTIBIOTICS AT COLL.:                                RECEIVED :  05/27/22 16:33  Stain, Gram                                FINAL       05/27/22 18:12  05/27/22   Few WBCs             No organisms seen             Stain performed on Cytospin (concentrated) specimen  Culture and Gram Stain, Aerobic, Body FluidFINAL       05/31/22 13:01  05/31/22   No growth      Urine culture [[id number]] Collected: 05/26/22 2234    Order Status: Completed Specimen: Bladder Updated: 05/28/22 1742    Narrative:      ORDER#: [id number]                                    ORDERED BY: Leigh Aurora, JODY  SOURCE: Urine                                        COLLECTED:  05/26/22 22:34  ANTIBIOTICS AT COLL.:                                RECEIVED :  05/27/22 14:13  Culture Urine  FINAL       05/28/22 17:42  05/28/22   10,000 - 30,000 CFU/MLof normal urogenital or skin microbiota, no             further work      Culture + Gram Stain,Aerobic, Body Fluid [[id number]]     Order Status: Canceled Specimen: Body Fluid from Peritoneal Fluid (Abdominal)     Culture Blood Aerobic and Anaerobic [[id number]] Collected: 05/26/22 1604    Order Status: Completed Specimen: Arm from Blood, Venipuncture Updated: 05/31/22 2221    Narrative:      ORDER#: [id number]                                    ORDERED BY: Caroline More  SOURCE: Blood, Venipuncture Arm                      COLLECTED:  05/26/22 16:04  ANTIBIOTICS AT COLL.:                                RECEIVED :  05/26/22 19:56  Culture Blood Aerobic and Anaerobic        FINAL       05/31/22 22:21  05/31/22   No growth after 5 days of incubation.      COVID-19 (SARS-CoV-2) and Influenza A/B, NAA (Liat Rapid) [[id number]] Collected: 05/26/22 1535    Order Status: Completed Specimen: Culturette from Nasopharyngeal Updated: 05/26/22 1601     Purpose of COVID testing Diagnostic -[facility]     SARS-CoV-2 Specimen Source Nasal  Swab     SARS CoV 2 Overall Result Not Detected     Comment: __________________________________________________  -A result of "Detected" indicates POSITIVE for the    presence of SARS CoV-2 RNA  -A result of "Not Detected" indicates NEGATIVE for the    presence of SARS CoV-2 RNA  __________________________________________________________  Test performed using the Roche cobas Liat SARS-CoV-2 assay. This assay is  only for use under the Food and Drug Administration's Emergency Use  Authorization. This is a real-time RT-PCR assay for the qualitative  detection of SARS-CoV-2 RNA. Viral nucleic acids may persist in vivo,  independent of viability. Detection of viral nucleic acid does not imply the  presence of infectious virus, or that virus nucleic acid is the cause of  clinical symptoms. Negative results do not preclude SARS-CoV-2 infection and  should not be used as the sole basis for diagnosis, treatment or other  patient management decisions. Negative results must be combined with  clinical observations, patient history, and/or epidemiological information.  Invalid results may be due to inhibiting substances in the specimen and  recollection should occur. Please see Fact Sheets for patients and providers  located:  WirelessDSLBlog.no          Influenza A Not Detected     Influenza B Not Detected     Comment: Test performed using the Roche cobas Liat SARS-CoV-2 & Influenza A/B assay.  This assay is only for use under the Food and Drug Administration's  Emergency Use Authorization. This is a multiplex real-time RT-PCR assay  intended for the simultaneous in vitro qualitative detection and  differentiation of SARS-CoV-2, influenza A, and influenza B virus RNA. Viral  nucleic acids may persist in vivo, independent of viability. Detection of  viral nucleic acid does not imply the presence of  infectious virus, or that  virus nucleic acid is the cause of clinical symptoms. Negative results  do  not preclude SARS-CoV-2, influenza A, and/or influenza B infection and  should not be used as the sole basis for diagnosis, treatment or other  patient management decisions. Negative results must be combined with  clinical observations, patient history, and/or epidemiological information.  Invalid results may be due to inhibiting substances in the specimen and  recollection should occur. Please see Fact Sheets for patients and providers  located: http://www.rice.biz/.         Narrative:      o Collect and clearly label specimen type:  o PREFERRED-Upper respiratory specimen: One Nasal Swab in  Transport Media.  o Hand deliver to laboratory ASAP  Diagnostic -[facility]    Culture Blood Aerobic and Anaerobic [[id number]] Collected: 06-07-2022 1526    Order Status: Completed Specimen: Arm from Blood, Venipuncture Updated: 05/31/22 2221    Narrative:      ORDER#: [id number]                                    ORDERED BY: Caroline More  SOURCE: Blood, Venipuncture Arm                      COLLECTED:  2022/06/07 15:26  ANTIBIOTICS AT COLL.:                                RECEIVED :  06-07-2022 19:56  Culture Blood Aerobic and Anaerobic        FINAL       05/31/22 22:21  05/31/22   No growth after 5 days of incubation.      Culture Blood Aerobic and Anaerobic [[id number]]     Order Status: Canceled Specimen: Arm from Blood, Venipuncture     Culture, Anaerobic Bacteria [[id number]] Collected: 06/07/22 0000    Order Status: Canceled Specimen: Other from Peritoneal Fluid (Abdominal)              RADIOLOGY     Tunneled Cath Placement (Permcath)    Result Date: 06/01/2022   Technically successful placement of a 23 cm  permcath via the right internal jugular vein with tip in the right atrium. Catheter ready for use. Suszanne Finch, MD 06/01/2022 10:31 AM    Drain (Other)    Result Date: 05/31/2022   Unsuccessful attempt at repositioning the left sided PD catheter into the pelvis. Dimitrios Papadouris, MD 05/31/2022 4:58  PM    XR Chest AP Portable    Result Date: 05/30/2022  No acute cardiopulmonary disease. No change from XR CHEST AP ONLY with report dated 06-07-22 4:03 PM. [name], MD 05/30/2022 1:12 PM    XR ABDOMEN 2 VIEW    Result Date: 05/29/2022  No evidence of kinking of the peritoneal dialysis catheter in the lateral left abdomen/pelvis. Carlynn Spry, MD 05/29/2022 5:44 PM    CT Chest Abdomen Pelvis WO Contrast    Result Date: Jun 07, 2022  1. Grade 3 pronounced right-sided hydronephrosis and diffuse ureterectasis to the level of the right ureterovesical junction of uncertain etiology. This is new from prior magnetic resonance imaging 2. Several small masses of the right kidney may represent small solid masses or complex cysts. These could be further evaluated by renal magnetic resonance imaging when patient is clinically stable Nitin Lucianne Muss,  MD 05/26/2022 4:47 PM    Chest AP Only    Result Date: 05/26/2022  No acute cardiopulmonary disease. Fonnie Mu, DO 05/26/2022 4:03 PM   Echo Results       None          No results found for this or any previous visit.    Signed,  Gayleen Orem, MD  3:51 PM 06/03/2022

## 2022-06-03 NOTE — Progress Notes (Signed)
06/03/22 1500   Volunteer Chaplain   Visit Type Initial   Source Chaplain Initiated   Present at Visit Patient;Nurse   Spiritual Care Provided to patient   Reason for Visit Spiritual Support   Spiritual Care Interventions Facilitated storytelling of medical narrative   Spiritual Care Outcomes Made a positive connection with patient   Length of Visit 16-30 minutes   Follow-up Follow-up important (4)

## 2022-06-03 NOTE — Malnutrition Assessment (Signed)
Shelby Barron is a 82 y.o. female patient.   [id number]    Malnutrition Assessment   Malnutrition Documentation    Severe Malnutrition related to inadequate oral intake in the setting of acute illness as evidenced by Patient meeting </= 50% of estimated energy requirements for >/= 5 days and 4.8% weight loss x 1 week        Jilda Roche MS, RDN  Clinical Dietitian PRN    If physician disagrees with this assessment see addendum.

## 2022-06-03 NOTE — Progress Notes (Signed)
Nutritional Support Services  Nutrition Assessment    ANQI ROBICHEAUX 82 y.o. female   MRN: [id number]    Summary of Nutrition Recommendations:    Continue cardiac diet - renal diet  Daily weights, standing scale preferable if able  Monitor and document po intake  Add glucerna and nepro daily to optimize nutrition  Each Glucerna shake provides 220 kcal and 10 gm protein.  Each Nepro shake provides 425 kcal, 19 gm protein, 250 mg potassium and 170 mg phosphorus.    -----------------------------------------------------------------------------------------------------------------                                                          Assessment Data:   Referral Source: Census review  Reason for Referral: LOS 8     Nutrition: pt seen at bedside. Daughter in room. Reports poor po since admission. Will take a bite and feel nauseous - been receiving zofran with little help.  Glucerna on table - encouraged to drink. Phos and potassium labs WNL. Will add glucerna and nepro daily. Pt reports weight loss ~10 lbs. Per epic- limited hx. Suspect reported is somewhat accurate as daughter says last weighed 205 at home. 4.8% loss in 1 week. NFPE performed showing no overt s/s of muscle or fat wasting. C/o some nausea currently. Daughter wanting to know what to feed patient at home. Provided handout on consistent carb, heart healthy and high protein diet. Monitor phos and potassium labs. Overall, pt meets malnutrition criteria.     Learning Needs: Provided handout on consistent carb, heart healthy and high protein diet    Hospital Admission: 82 y.o. 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 She underwent staging  CT scan on 04/29/2022, which showed moderate right hydroureteronephrosis, which is stable down to the level of the UVJ present for over a year. She had a low density right lower pole renal lesion and small pulmonary nodules measuring up to 5 mm; felt to be likely benign. Given these findings, patient moved here with her daughter and since gotten established with nephrology, Dr. Mirian Capuchin, and [facility] urology. The ultimate plan is for cystectomy and nephrectomy with formation of an [place] pouch, but patient needs cardiac clearance before surgery can be scheduled. Recently, patient began having severe pain with urination. She was placed on a 5 day course of Levaquin which she completed on 5/5. Symptoms, however, have persisted. She has also become extremely weak over the last week and is having constant suprapubic and lower abdominal tenderness. She had a telehealth visit earlier today with nephrology and was referred to the ED for evaluation where she underwent CT showing new finding of grade 3 pronounced right -sided hydronephrosis and diffuse ureterectasis to the level of the right UVJ and several small masses of the right kidney. Labs show WBC 17.54, hgb 10.2 (at baseline), cr 13.6, BUN 53, calcium 10.5, anion gap 18. Patient was hypotensive in the ED with BP 92/60. Case was discussed with patient's nephrologist, who was concerned about possible peritonitis. She was vancomycin &  cefepime in the ED and subsequently admitted to hospitalist service.     Medical Hx:  has a past medical history of Acid reflux, Asthma, well controlled, Cancer of kidney, Chronic lower back pain, COPD (chronic obstructive pulmonary disease), CVA (cerebral infarction) (Jan 12, 2003), Diabetes, Diabetic nephropathy, Fibroids, Gout, H/O: gout, Hyperlipidemia, Hypertension, Neuropathy of hand, and SOB (shortness of breath).    PSH: has a past surgical history that includes Hernia repair; APPENDECTOMY (OPEN); Hysterectomy; tumor removed from  kidney Marletta Lor 2015); Partial nephrectomy; TURBT; Drain (Other) (N/A, 05/31/2022); and Tunneled Cath Placement (Permcath) (N/A, 06/01/2022).     Orders Placed This Encounter   Procedures    Adult diet Therapeutic/ Modified; Solid; Regular (IDDSI level 7); Thin (IDDSI level 0); Cardiac renal     Intake: per RN flowsheet      05/30/22 1532 05/31/22 1700 06/01/22 0752   Intake (mL)   Percent Meal Consumed (%) 75% 50% NPO   Data Collected by Clinical Technician  --  Clinical Technician      06/01/22 1200   Intake (mL)   Percent Meal Consumed (%) 50%   Data Collected by Clinical Technician       ANTHROPOMETRIC  Height: 161.5 cm (5' 3.6")  Weight: 88.5 kg (195 lb 1.7 oz)     Weight Change: 0.05     Body mass index is 33.91 kg/m.      Weight Monitoring     Weight Weight Method   11/28/2012 96.163 kg     12/04/2012 96.163 kg  Stated    12/12/2012 96.163 kg  Stated    05/09/2013 95.255 kg     12/17/2014 94.348 kg  Stated    05/26/2022 88.451 kg  Stated     88.5 kg  Standing Scale      Weight History Summary: limited weight hx     ESTIMATED NEEDS    Total Daily Energy Needs: 1327.5 to 1770 kcal  Method for Calculating Energy Needs: 15 kcal - 20 kcal per kg  at 88.5 kg (Actual body weight)  Rationale: obese non icu       Total Daily Protein Needs: 80.286 to 107.048 g  Method for Calculating Protein Needs: 1.5 g - 2 g per kg at 53.524 kg (Ideal body weight)  Rationale: ideal BW non icu      Total Daily Fluid Needs: 1327.5 to 1770 ml  Method for Calculating Fluid Needs: 1 ml per kcal energy = 1327.5 to 1770 kcal   Or per MD       Pertinent Medications:  Current Facility-Administered Medications   Medication Dose Route Frequency    allopurinol  100 mg Oral Daily    atorvastatin  40 mg Oral Daily    b complex-vitamin c-folic acid  1 tablet Oral Daily    clopidogrel  75 mg Oral Daily    heparin (porcine)  5,000 Units Subcutaneous Q8H SCH    insulin glargine  10 Units Subcutaneous QAM    insulin lispro  1-3 Units Subcutaneous QHS     insulin lispro  1-5 Units Subcutaneous TID AC    iron polysaccharides  150 mg Oral Daily    lactobacillus/streptococcus  1 capsule Oral Daily    metoprolol tartrate  12.5 mg Oral Daily    montelukast  10 mg Oral QHS    ondansetron  4 mg Intravenous Once    ondansetron  4 mg Intravenous Once    oxybutynin XL  5 mg Oral Daily    pantoprazole  40 mg Oral Q12H    prochlorperazine  5 mg Intravenous Once    sevelamer carbonate  1,600 mg Oral BID AC    umeclidinium-vilanterol  1 puff Inhalation QAM    vancomycin  125 mg Oral 4 times per day     IVF:      Allergies   Allergen Reactions    Sulfa Antibiotics Hives         Pertinent labs:  Recent Labs   Lab 06/03/22  0423 06/02/22  0514 06/01/22  0315 05/31/22  0515 05/30/22  0453   Sodium 137 140 139 139 139   Potassium 3.6 3.6 3.8 4.0 3.8   Chloride 101 102 98* 97* 98*   CO2 25 25 24 24 24    BUN 25.0* 19.0 48.0* 44.0* 50.0*   Creatinine 9.4* 7.6* 14.2* 13.6* 14.3*   Glucose 147* 117* 130* 185* 111*   Calcium 8.4 8.4 8.7 8.9 8.7   Magnesium  --   --  2.2  --  2.3   Phosphorus  --  4.2 6.8*  --  6.2*   eGFR 3.8* 4.9* 2.3* 2.4* 2.3*   WBC 11.42* 10.96* 12.58* 12.42* 11.60*   Hematocrit 26.8* 28.1* 29.4* 29.6* 27.5*   Hgb 8.6* 8.9* 9.7* 9.4* 8.8*     Physical Assessment: 5/16  Head: no overt s/s subcutaneous muscle or fat loss  Upper Body: no overt s/s subcutaneous muscle or fat loss  Lower Body: no s/s subcutaneous fat or muscle loss  Edema: WDL  Skin: bruising scattered and scars scattered   GI function: rounded, tender, hyperactive, LBM 5/16. Gas                                                               Nutrition Diagnosis      Severe Malnutrition related to inadequate oral intake in the setting of acute illness as evidenced by Patient meeting </= 50% of estimated energy requirements for >/= 5 days and 4.8% weight loss x 1 week - new                                                              Intervention     Nutrition recommendation - Please refer to top of note                                                                 Monitoring/Evaluation     Goals:     Patient to meet >75% of estimated needs through meals and ONS on f/u.- new    Nutrition Risk Level: High (will follow up at least 2 times per week and PRN)     Jilda Roche MS, RDN  Clinical Dietitian PRN

## 2022-06-03 NOTE — Plan of Care (Signed)
Problem: Renal Instability  Goal: Fluid and electrolyte balance are achieved/maintained  Outcome: Progressing  Flowsheets (Taken 06/03/2022 1005)  Fluid and electrolyte balance are achieved/maintained:   Monitor for muscle weakness   Follow fluid restrictions/IV/PO parameters   Assess and reassess fluid and electrolyte status   Monitor/assess lab values and report abnormal values     Problem: Patient Receiving Advanced Renal Therapies  Goal: Therapy access site remains intact  Outcome: Progressing  Flowsheets (Taken 06/03/2022 1005)  Therapy access site remains intact:   Assess therapy access site   Change therapy access site dressing as needed

## 2022-06-03 NOTE — Progress Notes (Signed)
LOS # 8      Summary of Discharge Plan: CM talked to Pt daughter for [place] planning.    Daughter stated that she is not ready for Pt to [place] home until 5/17. Daughter stated that Pt needs stretcher and wants insurance to pay for transport.     CM to f/u for [place] needs.       Identified Possible Discharge Barriers:medically stable.       CM Interventions and Outcome: coordination of care to  home.       Discussed above Discharge Plan with (patient, family, Care Team, others):      Case Management will continue to following on patient's discharge needs.       Milus Height, MSW  [facility]  Case Manager Social Worker I

## 2022-06-03 NOTE — Progress Notes (Signed)
Pt is contact isolation, PPE done per protocol.  Pt is alert and oriented. Pt still c/o abdominal pain. R permanent catheter access is optimally working post dialysis. Catheter dressing clean, dry and intact. Hemodialysis ended and pt. able to finish tx.  Able to removed Net Fluid of 1.5L as ordered by MD.  Reports were given to Primary nurse.       06/03/22 1256   Treatment Summary   Time Off Machine 1245   Duration of Treatment (Hours) 3   Treatment Type 1:1   Dialyzer Clearance Moderately streaked   Fluid Volume Off (mL) 1950   Prime Volume (mL) 200   Rinseback Volume (mL) 250   Fluid Given: Normal Saline (mL) 0   Fluid Given: PRBC  0 mL   Fluid Given: Albumin (mL) 0   Fluid Given: Other (mL) 0   Total Fluid Given 450   Hemodialysis Net Fluid Removed 1500   Post Treatment Assessment   Post-Treatment Weight (Kg) -1.5   Patient Response to Treatment tolerated   Additional Dialyzer Used 0   Permacath Catheter - Tunneled 06/01/22 Internal Jugular Right   Placement Date/Time: 06/01/22 0956   Inserted by: Dr. Arlyce Dice  Access Type: Internal Jugular  Orientation: Right  Central Line Infection Prevention Education provided?: Yes  Hand Hygiene: Alcohol based hand scrub;Soap and water  Line cart used?: No  Li...   Line necessity reviewed? Apheresis/hemodialysis   Site Assessment Clean;Dry;Intact   Catheter Lumen Volume Venous 1.7 mL   Catheter Lumen Volume Arterial 1.7 mL   Dressing Status and Intervention Dressing Intact;Clean & Dry   [place]/Curos Caps on Catheter Yes   Dressing Type Transparent;Biopatch   Dressing Status Clean;Dry;Intact   Dressing Change Due 06/07/22   Vitals   Temp 97.4 F (36.3 C)   Heart Rate 82   Resp Rate 18   BP 114/63   SpO2 100 %   O2 Device None (Room air)   Assessment   Mental Status Alert;Oriented   Respiratory Pattern Return to WDL   Bilateral Breath Sounds Diminished   R Breath Sounds Diminished   L Breath Sounds Diminished   Edema  WDL   General Skin Color Appropriate for ethnicity   Skin  Condition/Temp Return to [facility]   Abdomen Inspection Rounded   GI Conditions Loss of appetite   Mobility Bed   Pain Assessment   Charting Type Assessment   Pain Scale Used Numeric Scale (0-10)   Numeric Pain Scale   Pain Score 5   POSS Score 1   Pain Location Abdomen   Education   Person taught Patient   Knowledge basis Substantial   Topics taught Procedure   Teaching Tools Explain   Reponse Verbalizes Understanding   Bedside Nurse Communication   Name of bedside RN - post dialysis Renard Matter, RN

## 2022-06-03 NOTE — Progress Notes (Signed)
Office: [phone]  Epic GroupChat: "[facility])"    Date Time: 06/03/22 @NOW   Patient Name: Shelby Barron, Shelby Barron      Problem List:      Admission with abd pain  Recently recognized bladder cancer with extension to ureter  Pyuria/ hematuria              -- recent dx of "UTI" treated with levaquin    -- outpt ucx grew "staph species", not aureus              -- prior h/o enterococcal UTI (march 2024)                          -- amp sensitive   -- Ucx 5/8 10-30,000 urogenital flora. 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 1 bowel movement today, more formed than yesterday     Antimicrobials:     #2 PO Vanc 5/15-    Prior  #2 levaquin 250mg  daily 5/14- 5/15  #4 zosyn 5/11-> stop  Vanc therapeutic 5/8-5/11 at least  Cefepime 1 gram IV daily #4     Estimated Creatinine Clearance: 4.8 mL/min (A) (based on SCr of 9.4 mg/dL (H)).    Assessment:     Elderly lady with bladder cancer with extension into ureters with pyuria/ hematuria. No fever but has leucocytosis. It may be that the active urine sediment may be due to tumor but she is at risk for infection and has had recent abx, which may not make the current cx acurate.     She does have a prior h/o enterococcus in the urine in 03/2022. Also grew "staph species", not aureus, in her outpatient urine (discussed with Dr. Thad Ranger), sensitive to bactrim and levaquin, just prior to admission.     Has received a total of 7d for each of her prior enterococcus (vanc -> zosyn) and the CONS (vanc-> levaquin). Clinical significance of these bacterial cx remains in question, unclear if she has true UTI, as her pyuria may be due to her malignancy.  Her urinary symptoms seem to have mostly improved, so have stopped abx given her diarrhea (and the fact that she has received an adequate length course).     Yesterday developed significant dirarrhea. So C Diff testing was ordered and PO Vanc started empirically. However her diarrhea appears to have resolved. May have just been abx side effect. SO can stop c diff testing/treatment.     Plan:     - Stopped levaquin  - Can stop PO Vanc  - Defer C Diff testing as her diarrhea has resolved    Monitoring clinical response and untoward effects of high risk/IV antimicrobials    Lines:     Patient Lines/Drains/Airways Status       Active PICC Line / CVC Line / PIV Line / Drain / Airway / Intraosseous Line / Epidural Line / ART Line / Line / Wound / Pressure Ulcer / NG/OG Tube       Name Placement date Placement time Site Days  Peripheral IV 05/29/22 20 G Right Antecubital 05/29/22  1620  Antecubital  1    Peritoneal Dialysis Catheter Mid lower abdomen 05/26/22  2000  -- 4                    *I have performed a risk-benefit analysis and the patient needs a central line for access and IV medications      Review of Systems:   General ROS: negative for - chills, fevers, night sweats, weight loss   HEENT: negative for - blurry vision, sore throat, thrush   Respiratory ROS: negative for cough, SOB  Cardiovascular ROS: negative for - chest pain, palpitations   Gastrointestinal ROS: negative for - abdominal pain, nausea, vomiting, diarrhea  Genito-Urinary ROS: negative for - dysuria, urinary frequency/urgency   Musculoskeletal ROS: negative for - joint pain, joint stiffness or muscle pain   Dermatological ROS: negative for - rash and skin lesion changes   Neurological ROS: negative for - confusion, headache, dizziness  Hematological ROS: negative for - bruising, bleeding   Psychological ROS: negative for - changes in mood    Physical Exam:     Vitals:    06/03/22 1256   BP: 114/63   Pulse: 82   Resp: 18   Temp: 97.4 F (36.3 C)    SpO2: 100%       Awake, alert, no distress, cooperative.    HENT normal. Mouth moist, face symmetric  Lungs coarse breath sounds, without rales, wheezing  Cv normal without murmur, rub, or gallop  Abdomen soft nontender, PD cath site not inflamed  Extrem normal without phlebitis or edema.   No rash.   Neurologic exam non-focal.   Normal mental status.         Family History:     Family History   Problem Relation Age of Onset    Cancer Mother     Heart disease Father     Cancer Maternal Aunt     Diabetes Maternal Aunt     Cancer Paternal Aunt        Social History:     Social History     Socioeconomic History    Marital status: Married     Spouse name: Not on file    Number of children: Not on file    Years of education: Not on file    Highest education level: Not on file   Occupational History    Not on file   Tobacco Use    Smoking status: Former    Smokeless tobacco: Never   Vaping Use    Vaping status: Never Used   Substance and Sexual Activity    Alcohol use: No    Drug use: No    Sexual activity: Not on file   Other Topics Concern    Not on file   Social History Narrative    Not on file     Social Determinants of Health     Financial Resource Strain: Not on file   Food Insecurity: No Food Insecurity (05/26/2022)    Hunger Vital Sign     Worried About Running Out of Food in the Last Year: Never true     Ran Out of Food in the Last Year: Never true   Transportation Needs: No Transportation Needs (05/26/2022)    PRAPARE - Therapist, art (Medical): No     Lack of Transportation (Non-Medical): No   Physical Activity: Not on file  Stress: Not on file   Social Connections: Unknown (02/01/2022)    Received from [facility]    Social Network     Social Network: Not on file   Intimate Partner Violence: Not At Risk (05/26/2022)    Humiliation, Afraid, Rape, and Kick questionnaire     Fear of Current or Ex-Partner: No     Emotionally Abused: No     Physically Abused: No     Sexually Abused: No    Housing Stability: Unknown (05/26/2022)    Housing Stability Vital Sign     Unable to Pay for Housing in the Last Year: No     Number of Places Lived in the Last Year: Not on file     Unstable Housing in the Last Year: No       Allergies:     Allergies   Allergen Reactions    Sulfa Antibiotics Hives       Labs:     Lab Results   Component Value Date    WBC 11.42 (H) 06/03/2022    HGB 8.6 (L) 06/03/2022    HCT 26.8 (L) 06/03/2022    MCV 97.5 (H) 06/03/2022    PLT 196 06/03/2022     Lab Results   Component Value Date    CREAT 9.4 (H) 06/03/2022     Lab Results   Component Value Date    ALT 13 05/26/2022    AST 18 05/26/2022    ALKPHOS 78 05/26/2022    BILITOTAL 0.6 05/26/2022     Lab Results   Component Value Date    LACTATE 2.0 05/26/2022       Microbiology:     Microbiology Results (last 15 days)       Procedure Component Value Units Date/Time    Clostridium difficile toxin B PCR [[id number]]     Order Status: Sent Specimen: Stool     Culture + Gram Stain,Aerobic, Body Fluid [[id number]] Collected: 05/27/22 1321    Order Status: Completed Specimen: Body Fluid from Peritoneal Fluid (Abdominal) Updated: 05/31/22 1301    Narrative:      ORDER#: [id number]                                    ORDERED BY: Georgiana Shore  SOURCE: Peritoneal Fluid (Abdominal) peritoneal fluidCOLLECTED:  05/27/22 13:21  ANTIBIOTICS AT COLL.:                                RECEIVED :  05/27/22 16:33  Stain, Gram                                FINAL       05/27/22 18:12  05/27/22   Few WBCs             No organisms seen             Stain performed on Cytospin (concentrated) specimen  Culture and Gram Stain, Aerobic, Body FluidFINAL       05/31/22 13:01  05/31/22   No growth      Urine culture [[id number]] Collected: 05/26/22 2234    Order Status: Completed Specimen: Bladder Updated: 05/28/22 1742    Narrative:      ORDER#: [id number]  ORDERED BY: GOARD, JODY  SOURCE: Urine                                         COLLECTED:  05/26/22 22:34  ANTIBIOTICS AT COLL.:                                RECEIVED :  05/27/22 14:13  Culture Urine                              FINAL       05/28/22 17:42  05/28/22   10,000 - 30,000 CFU/MLof normal urogenital or skin microbiota, no             further work      Culture + Gram Stain,Aerobic, Body Fluid [[id number]]     Order Status: Canceled Specimen: Body Fluid from Peritoneal Fluid (Abdominal)     Culture Blood Aerobic and Anaerobic [[id number]] Collected: 05/26/22 1604    Order Status: Completed Specimen: Arm from Blood, Venipuncture Updated: 05/31/22 2221    Narrative:      ORDER#: [id number]                                    ORDERED BY: Caroline More  SOURCE: Blood, Venipuncture Arm                      COLLECTED:  05/26/22 16:04  ANTIBIOTICS AT COLL.:                                RECEIVED :  05/26/22 19:56  Culture Blood Aerobic and Anaerobic        FINAL       05/31/22 22:21  05/31/22   No growth after 5 days of incubation.      COVID-19 (SARS-CoV-2) and Influenza A/B, NAA (Liat Rapid) [[id number]] Collected: 05/26/22 1535    Order Status: Completed Specimen: Culturette from Nasopharyngeal Updated: 05/26/22 1601     Purpose of COVID testing Diagnostic -PUI     SARS-CoV-2 Specimen Source Nasal Swab     SARS CoV 2 Overall Result Not Detected     Comment: __________________________________________________  -A result of "Detected" indicates POSITIVE for the    presence of SARS CoV-2 RNA  -A result of "Not Detected" indicates NEGATIVE for the    presence of SARS CoV-2 RNA  __________________________________________________________  Test performed using the Roche cobas Liat SARS-CoV-2 assay. This assay is  only for use under the Food and Drug Administration's Emergency Use  Authorization. This is a real-time RT-PCR assay for the qualitative  detection of SARS-CoV-2 RNA. Viral nucleic acids may persist in vivo,  independent of viability. Detection of viral nucleic acid does not imply  the  presence of infectious virus, or that virus nucleic acid is the cause of  clinical symptoms. Negative results do not preclude SARS-CoV-2 infection and  should not be used as the sole basis for diagnosis, treatment or other  patient management decisions. Negative results must be combined with  clinical observations, patient history, and/or epidemiological information.  Invalid results may be due to inhibiting substances in the specimen  and  recollection should occur. Please see Fact Sheets for patients and providers  located:  WirelessDSLBlog.no          Influenza A Not Detected     Influenza B Not Detected     Comment: Test performed using the Roche cobas Liat SARS-CoV-2 & Influenza A/B assay.  This assay is only for use under the Food and Drug Administration's  Emergency Use Authorization. This is a multiplex real-time RT-PCR assay  intended for the simultaneous in vitro qualitative detection and  differentiation of SARS-CoV-2, influenza A, and influenza B virus RNA. Viral  nucleic acids may persist in vivo, independent of viability. Detection of  viral nucleic acid does not imply the presence of infectious virus, or that  virus nucleic acid is the cause of clinical symptoms. Negative results do  not preclude SARS-CoV-2, influenza A, and/or influenza B infection and  should not be used as the sole basis for diagnosis, treatment or other  patient management decisions. Negative results must be combined with  clinical observations, patient history, and/or epidemiological information.  Invalid results may be due to inhibiting substances in the specimen and  recollection should occur. Please see Fact Sheets for patients and providers  located: http://www.rice.biz/.         Narrative:      o Collect and clearly label specimen type:  o PREFERRED-Upper respiratory specimen: One Nasal Swab in  Transport Media.  o Hand deliver to laboratory ASAP  Diagnostic -PUI    Culture  Blood Aerobic and Anaerobic [[id number]] Collected: 05/26/22 1526    Order Status: Completed Specimen: Arm from Blood, Venipuncture Updated: 05/31/22 2221    Narrative:      ORDER#: [id number]                                    ORDERED BY: Caroline More  SOURCE: Blood, Venipuncture Arm                      COLLECTED:  05/26/22 15:26  ANTIBIOTICS AT COLL.:                                RECEIVED :  05/26/22 19:56  Culture Blood Aerobic and Anaerobic        FINAL       05/31/22 22:21  05/31/22   No growth after 5 days of incubation.      Culture Blood Aerobic and Anaerobic [[id number]]     Order Status: Canceled Specimen: Arm from Blood, Venipuncture     Culture, Anaerobic Bacteria [[id number]] Collected: 05/26/22 0000    Order Status: Canceled Specimen: Other from Peritoneal Fluid (Abdominal)             Rads:   No results found.    Thank you for this interesting consult. During the week, please reach out to author of the most recent ID note.     Signed by: Armen Pickup, MD

## 2022-06-04 LAB — WHOLE BLOOD GLUCOSE POCT
Whole Blood Glucose POCT: 144 mg/dL — ABNORMAL HIGH (ref 70–100)
Whole Blood Glucose POCT: 197 mg/dL — ABNORMAL HIGH (ref 70–100)
Whole Blood Glucose POCT: 107 mg/dL — ABNORMAL HIGH (ref 70–100)

## 2022-06-04 MED ORDER — ONDANSETRON HCL 4 MG PO TABS
4.0000 mg | ORAL_TABLET | Freq: Three times a day (TID) | ORAL | 0 refills | Status: DC | PRN
Start: 2022-06-04 — End: 2022-09-03

## 2022-06-04 NOTE — Progress Notes (Signed)
06/04/22 1100   Volunteer Chaplain   Visit Type Follow-up   Source Chaplain Initiated   Present at Visit Patient   Spiritual Care Provided to patient   Reason for Visit Spiritual Support;Emotional Support   Spiritual Care Interventions Facilitated life-review;Provided emotional support;Provided reflective and compassionate listening;Provided comfort, encouragement,affirmation   Spiritual Care Outcomes Made a positive connection with patient;Patient expressed appreciation of visit   Length of Visit 31-60 minutes   Follow-up Follow-up routine (3)

## 2022-06-04 NOTE — Progress Notes (Signed)
06/04/22 1334   Medicare Checklist   Is this a Medicare patient? Yes   Patient received 1st IMM Letter? Yes   If LOS 3 days or greater, did patient received 2nd IMM Letter? Yes   Date of 2nd IMM Letter 06/04/22   Time of 2nd IMM letter 1115     Brock Bad, CMS

## 2022-06-04 NOTE — Progress Notes (Signed)
HD transportation:    [facility] transportation has been arranged for Saturday, 05/18 pick up time will be 11:05 a.m. from [address] [place] [address], going to [facility]: [address], [place], [place] [address], and then pick up time from HD will be at 04:00 p.m. to go back to [address] [place] [address]. Trip# [phone].  Tuesday, 05/21 pick up time will be between 11:05 a.m. from [address] [place] [address], going to [facility]: [address], [place], [place] [address], and then pick up time from HD will be at 04:00 p.m. to go back to pt's home address. Trip# [phone].   Spoke to rep [place] with [employer] [phone])

## 2022-06-04 NOTE — Progress Notes (Signed)
D/C plan reviewed. [facility] OP HD confirmed for Saturday 06/05/22 at 12:15 PM. [employer] to transport    DCP: Home with [facility] PT/OT/SN/HHA and OP HD [facility]        06/04/22 1043   Discharge Disposition   Patient preference/choice provided? Yes   Physical Discharge Disposition Home, Home Health   Name of Home Health Agency Placement   ([facility])   Mode of Transportation   ([employer] Stretcher transportation)   Patient/Family/POA notified of transfer plan Yes   Patient agreeable to discharge plan/expected d/c date? Yes   Family/POA agreeable to discharge plan/expected d/c date? Yes   Bedside nurse notified of transport plan? Yes   CM Interventions   Follow up appointment scheduled? Yes  ([facility] appointment 06/07/22 at 10:40 AM)   Referral made for home health RN visit? Yes   Multidisciplinary rounds/family meeting before d/c? Yes   Medicare Checklist   Is this a Medicare patient? No     Natasha Bence, RN, BSN, CM  RN Care Manager II  [facility]  [phone]

## 2022-06-04 NOTE — Progress Notes (Signed)
Pt was supposed to be leaving around 1410, IV site removed per protocol. AVS discharge summary printed and reviewed with the pt, pt verbalized understanding. All belongings backed and got pt ready, transport got cancelled and rescheduled for 1630, transport did not show up again. Rescheduled again 2100 pick time tonight. Pt/daughter/case manager/charge nurse and upcoming nurse aware. Pt was anxious and upset, emotional support offered and reassurance provided.

## 2022-06-04 NOTE — Progress Notes (Addendum)
Discharge transportation:    Contacted [employer] [phone]) spoke to [place]. Stretcher transportation going to [address] [place] [address] set up for today at 02:10 p.m. Driver will contact pt's nurse at [phone] if there's an update for pick up time. Trip [id number].

## 2022-06-04 NOTE — Progress Notes (Signed)
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 - OP urology follow up for surgery in the future [facility] urology)      Recommendations:     Stable for discharge from renal perspective  Appreciate CM assisting with OP HD chair  Plan for HD TTS 2nd shift at [facility], starting tomorrow - Dr.Patel (nephrologist) notified       Case discussed with: pt      Delice Bison, MD  [facility]  [id number] (office)      Subjective:     Doing well  Reports pruritus at the site of catheter - skin a bit red but no drainage noted     Review of Systems:   No cp      Physical Exam:     Vitals:    06/04/22 0532 06/04/22 0645 06/04/22 0905 06/04/22 1141   BP: 115/67 122/68 133/59 133/66   Pulse: 82 90 98 85   Resp:  15  15   Temp: 97.7 F (36.5 C) 98.6 F (37 C)  97.3 F (36.3 C)   TempSrc: Oral Oral  Oral   SpO2: 99% 95%  100%   Weight:       Height:           Intake and Output Summary (Last 24 hours) at Date Time    Intake/Output Summary (Last 24 hours) at 06/04/2022 1346  Last data filed at 06/03/2022 1516  Gross per 24 hour   Intake 240 ml   Output --   Net 240 ml       General: awake, alert, no acute distress  Cardiovascular: regular rate and rhythm  Lungs: clear to auscultation bilaterally  Abdomen: soft, NT, ND  Extremities: no edema    Access: R TDC      Meds:      Scheduled Meds: PRN Meds:    allopurinol, 100 mg, Oral, Daily  atorvastatin, 40 mg, Oral, Daily  b complex-vitamin c-folic acid, 1 tablet, Oral, Daily  clopidogrel, 75 mg, Oral, Daily  heparin (porcine),  5,000 Units, Subcutaneous, Q8H SCH  insulin glargine, 10 Units, Subcutaneous, QAM  insulin lispro, 1-3 Units, Subcutaneous, QHS  insulin lispro, 1-5 Units, Subcutaneous, TID AC  iron polysaccharides, 150 mg, Oral, Daily  lactobacillus/streptococcus, 1 capsule, Oral, Daily  metoprolol tartrate, 12.5 mg, Oral, Daily  montelukast, 10 mg, Oral, QHS  ondansetron, 4 mg, Intravenous, Once  ondansetron, 4 mg, Intravenous, Once  oxybutynin XL, 5 mg, Oral, Daily  pantoprazole, 40 mg, Oral, Q12H  prochlorperazine, 5 mg, Intravenous, Once  sevelamer carbonate, 1,600 mg, Oral, BID AC  umeclidinium-vilanterol, 1 puff, Inhalation, QAM          Continuous Infusions:     acetaminophen, 650 mg, Q6H PRN  albuterol-ipratropium, 3 mL, Q6H PRN  benzocaine-menthol, 1 lozenge, Q2H PRN  benzonatate, 100 mg, TID PRN  carboxymethylcellulose sodium, 1 drop, TID PRN  dextrose, 15 g of glucose, PRN   Or  dextrose, 12.5 g, PRN   Or  dextrose, 12.5 g, PRN   Or  glucagon (rDNA), 1 mg, PRN  diphenhydrAMINE, 25 mg, QHS PRN  HYDROmorphone, 0.2 mg, Q4H PRN  hyoscyamine, 0.125 mg, Q4H PRN  melatonin, 3 mg, QHS PRN  naloxone, 0.2 mg, PRN  ondansetron, 4 mg, Q6H PRN   Or  ondansetron, 4 mg, Q6H PRN  oxyCODONE, 5 mg, Q6H PRN  saline, 2 spray, Q4H PRN              Labs:     Recent Labs   Lab 06/03/22  0423 06/02/22  0514 06/01/22  0315   WBC 11.42* 10.96* 12.58*   Hgb 8.6* 8.9* 9.7*   Hematocrit 26.8* 28.1* 29.4*   Platelets 196 224 259     Recent Labs   Lab 06/03/22  0423 06/02/22  0514 06/01/22  0315 05/31/22  0515 05/30/22  0453   Sodium 137 140 139  More results in Results Review 139   Potassium 3.6 3.6 3.8  More results in Results Review 3.8   Chloride 101 102 98*  More results in Results Review 98*   CO2 25 25 24   More results in Results Review 24   BUN 25.0* 19.0 48.0*  More results in Results Review 50.0*   Creatinine 9.4* 7.6* 14.2*  More results in Results Review 14.3*   Calcium 8.4 8.4 8.7  More results in Results Review 8.7   Phosphorus  --  4.2  6.8*  --  6.2*   Magnesium  --   --  2.2  --  2.3   Glucose 147* 117* 130*  More results in Results Review 111*   eGFR 3.8* 4.9* 2.3*  More results in Results Review 2.3*   More results in Results Review = values in this interval not displayed.         Signed by: Delice Bison, MD

## 2022-06-04 NOTE — Plan of Care (Signed)
NURSING SHIFT NOTE     Patient: Shelby Barron  Day: 9      SHIFT EVENTS     Shift Narrative/Significant Events (PRN med administration, fall, RRT, etc.):   Pt AOX4, forgetful and anxious at times. Pain in her abdomen is managed with PRN oxy and nausea managed with PRN Zofran. No sob or respiratory distress noted. VSS and continue to monitor. Appetite remains fair, no hypoglycemia or hyperglycemia noted. Skin care provided. Pt anticipating discharge.   Safety and fall precautions remain in place. Purposeful rounding completed.          ASSESSMENT     Changes in assessment from patient's baseline this shift:    Neuro: No  CV: No  Pulm: No  Peripheral Vascular: No  HEENT: No  GI: No  BM during shift: No, Last BM: Last BM Date: 06/03/22  GU: No   Integ: No  MS: No    Pain: Improved  Pain Interventions: Medications  Medications Utilized: Oxycodone    Mobility: PMP Activity: Step 6 - Walks in Room of Distance Walked (ft) (Step 6,7): 20 Feet           Lines     Patient Lines/Drains/Airways Status       Active Lines, Drains and Airways       Name Placement date Placement time Site Days    Permacath Catheter - Tunneled 06/01/22 Internal Jugular Right 06/01/22  0956  -- 3    Peripheral IV 05/29/22 20 G Right Antecubital 05/29/22  1620  Antecubital  6    Peritoneal Dialysis Catheter Mid lower abdomen 05/26/22  2000  -- 8                         VITAL SIGNS     Vitals:    06/04/22 1700   BP: 104/61   Pulse: 83   Resp: 15   Temp: 98.7 F (37.1 C)   SpO2: 98%       Temp  Min: 97.3 F (36.3 C)  Max: 98.7 F (37.1 C)  Pulse  Min: 82  Max: 98  Resp  Min: 15  Max: 15  BP  Min: 104/61  Max: 133/66  SpO2  Min: 95 %  Max: 100 %    No intake or output data in the 24 hours ending 06/04/22 1951       CARE PLAN     Problem: Pain interferes with ability to perform ADL  Goal: Pain at adequate level as identified by patient  Outcome: Adequate for Discharge  Flowsheets (Taken 06/02/2022 1804 by Corrie Mckusick, RN)  Pain at  adequate level as identified by patient:   Identify patient comfort function goal   Assess for risk of opioid induced respiratory depression, including snoring/sleep apnea. Alert healthcare team of risk factors identified.   Assess pain on admission, during daily assessment and/or before any "as needed" intervention(s)   Reassess pain within 30-60 minutes of any procedure/intervention, per Pain Assessment, Intervention, Reassessment (AIR) Cycle   Evaluate if patient comfort function goal is met   Evaluate patient's satisfaction with pain management progress   Offer non-pharmacological pain management interventions     Problem: Side Effects from Pain Analgesia  Goal: Patient will experience minimal side effects of analgesic therapy  Outcome: Adequate for Discharge     Problem: Moderate/High Fall Risk Score >5  Goal: Patient will remain free of falls  Outcome: Adequate for Discharge  Flowsheets (Taken 06/04/2022 0815)  Moderate Risk (6-13):   MOD-Perform dangle, stand, walk (DSW) prior to mobilization   MOD-Apply bed exit alarm if patient is confused   MOD-Consider activation of bed alarm if appropriate     Problem: Renal Instability  Goal: Fluid and electrolyte balance are achieved/maintained  Outcome: Adequate for Discharge  Flowsheets (Taken 06/03/2022 1005 by Bunnie Philips, RN)  Fluid and electrolyte balance are achieved/maintained:   Monitor for muscle weakness   Follow fluid restrictions/IV/PO parameters   Assess and reassess fluid and electrolyte status   Monitor/assess lab values and report abnormal values     Problem: Patient Receiving Advanced Renal Therapies  Goal: Therapy access site remains intact  Outcome: Adequate for Discharge     Problem: Compromised Activity/Mobility  Goal: Activity/Mobility Interventions  Outcome: Adequate for Discharge  Flowsheets (Taken 06/04/2022 0815)  Activity/Mobility Interventions: Pad bony prominences, TAP Seated positioning system when OOB, Promote PMP, Reposition q 2 hrs /  turn clock, Offload heels     Problem: Fluid and Electrolyte Imbalance/ Endocrine  Goal: Fluid and electrolyte balance are achieved/maintained  Outcome: Adequate for Discharge  Flowsheets (Taken 06/02/2022 1804 by Corrie Mckusick, RN)  Fluid and electrolyte balance are achieved/maintained:   Monitor/assess lab values and report abnormal values   Assess and reassess fluid and electrolyte status   Monitor for muscle weakness  Goal: Adequate hydration  Outcome: Adequate for Discharge  Flowsheets (Taken 06/02/2022 1804 by Corrie Mckusick, RN)  Adequate hydration:   Assess mucus membranes, skin color, turgor, perfusion and presence of edema   Assess for peripheral, sacral, periorbital and abdominal edema   Monitor and assess vital signs and perfusion     Problem: Compromised Sensory Perception  Goal: Sensory Perception Interventions  Outcome: Adequate for Discharge  Flowsheets (Taken 06/04/2022 0815)  Sensory Perception Interventions: Offload heels, Pad bony prominences, Reposition q 2hrs/turn Clock, Q2 hour skin assessment under devices if present     Problem: Compromised Moisture  Goal: Moisture level Interventions  Outcome: Adequate for Discharge  Flowsheets (Taken 06/04/2022 0815)  Moisture level Interventions: Moisture wicking products, Moisture barrier cream     Problem: Compromised Friction/Shear  Goal: Friction and Shear Interventions  Outcome: Adequate for Discharge  Flowsheets (Taken 06/04/2022 0815)  Friction and Shear Interventions: Pad bony prominences, Off load heels, HOB 30 degrees or less unless contraindicated, Consider: TAP seated positioning, Heel foams     Problem: Compromised Nutrition  Goal: Nutrition Interventions  Outcome: Adequate for Discharge  Flowsheets (Taken 06/04/2022 0815)  Nutrition Interventions: Discuss nutrition at Rounds, I&Os, Document % meal eaten, Daily weights

## 2022-06-04 NOTE — Progress Notes (Signed)
Patient discharge home with discharge instruction paper. Patient was stable no complain of pain. IV removed. Patient transported with transport via [facility].  All patient belonging send with patient.

## 2022-06-04 NOTE — PT Progress Note (Signed)
[facility]  Physical Therapy Attempt Note    Patient:  OZIE MEDVEDEV MRN#:  [id number]  Unit:  [facility] NORTH Room/Bed:  A4027/A4027.01      Physical Therapy treatment attempted on 06/04/2022 at 8:54 AM.     PT Visit Cancellation Reason: Patient/caregiver declines therapy at this time (pt currently refusing therapy services and requesting to follow up at a later time)         Physical therapy will follow up at a later time/date as able.     RN aware.    Nicoletta Dress, PT, DPT  06/04/2022  8:54 AM    Physical Therapist  Physical Medicine and Rehabilitation  [place], [place], Thurs 8-4:30pm  [place], Fri 12:30-9:00pm  [phone]

## 2022-06-04 NOTE — Progress Notes (Addendum)
D/C order in place today. DCP: Home with HH PT/OT/SN/HHA and OP HD [facility] at 12:15 PM. CM sent HH referral to [facility] today via secure Epic chat. [facility] referral sent. CM called and spoke with [name] (dtr) to review D/C plan, Transportation and OP HD- [name] agreed with D/C plan. [name] requested for CM to set up stretcher transportation home today with pick up time at 12:30 PM - CM sent transportation request to CMA via secure Epic chat.  [name] also requested for stretcher transportation to be set up for 5/18 and 5/21 from home [address] [place] [address]) to OP HD ( [address], [place], [place] [address]) and back home- CM sent transportation request to CMA. Greatly appreciated the assistance from CMA.     At 10:58 AM received report from CMA that pick up time at 2:10 PM today and patient to book the dialysis appointment; Pt only has hospital discharge benefits; Pt will need to contact her insurance the 'post discharge authorization team' to do the service authorization for dialysis appointments. CM notified [name] of the above - [name] verbalized understanding.      At 11:23 received report from CM that [facility] able to set up transportation to [facility] HD appointment.  Stretcher transportation has been arranged for Saturday, 05/18 pick up time will be 11:05 a.m. from [address] [place] [address], going to [facility]: [address], [place], [place] [address], and then pick up time from HD will be at 04:00 p.m. to go back to [address] [place] [address]. Trip# [phone].  Tuesday, 05/21 pick up time will be between 11:05 a.m. from [address] [place] [address], going to [facility]: [address], [place], [place] [address], and then pick up time from HD will be at 04:00 p.m. to go back to pt's home address. Trip# [id number].   CM called and left VM of the above to [place].     At 2:58 PM CM received report that [facility]  transport not here yet. CM called and spoke with Trula Ore- [employer], per [facility] medical transport to pick up at 4:30 PM today. Trip [id number].     Natasha Bence, RN, BSN, CM  RN Care Manager II  [facility]  [phone]

## 2022-06-04 NOTE — Discharge Summary (Signed)
[facility]      Patient: Shelby Barron  Admission Date: 05/26/2022   DOB: Feb 08, 1940  Discharge Date: 06/04/2022    MRN: [id number]  Discharge Attending: A , MD     Referring Physician: Patsy Lager, MD  PCP: Patsy Lager, MD       DISCHARGE SUMMARY     Discharge Information   Admission Diagnosis:   Peritonitis    Discharge Diagnosis:   Active Hospital Problems    Diagnosis    Peritonitis    ESRD (end stage renal disease)   Sepsis 2/2 UTI resolved  Frequent watery diarrhea, resolved  High grade urothelioma bladder CA   Obstructive uropathy with grade 3 hydronephrosis  RCC s/p partial nephrectomy   Malfunctioning peritoneal dialysis portal  COPD  Not in exacerbation  HTN  HLD  H/o CVA  ESRD on PD  HAGMA 2/2 ESRD  DMII  GERD  Gout  Iron deficient anemia  Anemia of chronic disease    Malnutrition Documentation    Severe Malnutrition related to inadequate oral intake in the setting of acute illness as evidenced by Patient meeting </= 50% of estimated energy requirements for >/= 5 days and 4.8% weight loss x 1 week         Admission Condition: acutely sick  Discharge Condition: stable and improved   Consultants: nephrology and ID  Functional Status: ambulate with out assistance.   Discharged to: home with home health.        Hospital Course   Presentation History   82 y.o. 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   Patient was sent to the ER by her nephrologist for dysuria and suprapubic pain peristent despite 5d course of Levaquin.  CT showing new finding of grade 3 pronounced right -sided hydronephrosis and diffuse ureterectasis to the level of the right UVJ and several small masses of the right kidney. Urology consulted and did not recommend any acute  intervention this admission for worsening hydro, just continue treatment with empiric antibiotics for UTI.  Stay complicated with frequent watery diarrhea ID would like to rule out C. difficile and started p.o. vancomycin and ordered C. difficile test.    5/15  Patient reported initially this morning 2 episode of watery diarrhea.  By midday she had another 3 episode.  Stool softener has been discontinued  No significant abdominal pain or fever  ID would like to C. difficile infection.  5/16  Since C. difficile order placed in no further diarrhea reported so C. difficile is not done  Test discontinued, vancomycin discontinued will watch her for today  Discussed with the patient as well as case coordinator to facilitate early discharge for tomorrow  I have discussed with the daughter as well.  5/17  Patient remained stable for discharge.  Transport arranged.  Per admission H&P:    Hospital Course (9 Days)     Sepsis 2/2 UTI resolved   - lactic normal, leukocytosis improving   -Blood culture and urine culture remain negative  -Given patient failed outpatient Levaquin infectious disease consulted recommended to continue Zosyn over the weekend, so patient received cefepime for 3 days, Zosyn for 4 days and Levaquin for 1 day  Antibiotic discontinued on 5/15 because of diarrhea  Frequent watery diarrhea, resolved  Initially there was concern for constipation and patient was on stool softener which was discontinued on 5/14, this morning reported 2 episode of diarrhea for me and by mid-day patient reported 5 episodes of diarrhea for infectious disease doctor.  Given high risk for C. difficile ID discontinued antibiotic, ordered oral vancomycin and C. difficile test.  Since C. difficile test ordered no diarrhea reported, C. difficile test discontinued 5/16.  ID recommended to discontinue vancomycin as well.  Less likely to be C. difficile.  Outpatient follow-up with PCP.        High grade urothelioma bladder CA    Obstructive uropathy with grade 3 hydronephrosis  RCC s/p partial nephrectomy    - Followed by Dr Celine Mans outpatient with plans for cystectomy and nephrectomy   - Urology consulted, since no pyelo on imaging likely nothing to do for worsening hyrdo this admission   - CTAP with grade 3 R sided hydronephrosis and ureterectasis to the R UVJ. Several small masses of the R kidney which may be solid masses vs complex cysts.  Outpatient follow-up with her urologist     Malfunctioning peritoneal dialysis portal  At this point peritoneal dialysis fairly working per nephrology and recommend to continue  Ordered abdominal x-ray to see if it is kinked if not possible manipulation by IR as an outpatient.  Status post IR evaluation of the peritoneum post done on 05/31/2022 which is unsuccessful recommended surgical evaluation, nephrology planning to switch to hemodialysis  S/p tunneled dialysis cath done on 5/14.  Outpatient follow-up with nephrology.     COPD  Not in exacerbation   stable, chest x-ray unremarkable  Continue anoro ellipta & singulair per home dose  As needed albuterol for shortness of breath  Outpatient follow-up with pulmonologist    HTN  HLD  H/o CVA  Continue metoprolol/atorvastatin  Outpatient follow-up with PCP     ESRD on PD  HAGMA 2/2 ESRD   nephrology consulted, continue PD as ordered  Continue nephrovite, sensipar, renvela  tunneled cath was placed on 5/14 because of the PD cath dysfunction  Outpatient dialysis seat arranged follow-up with nephrology once discharged.     DMII  A1c is 8.5   continue reduced dose lantus 10 U daily   low dose SSI  POC fairly controlled with the above regimen   carb consistent diet  I will discharge her with Lantus 12 units, discussed with patient as well as daughter to follow-up with PCP for further adjustment of Lantus needed.  Outpatient follow-up with PCP     GERD  Continue PPI     Gout  Continue allopurinol     Iron deficient anemia  Anemia of chronic disease  Continue  iron supplement  Outpatient follow-up with PCP/nephrologist      Plan discussed with the patient as well as daughter in detail, question answered concern addressed       Progress Note/Physical Exam at Discharge     Subjective:Patient is stable for discharge.    Vitals:    06/04/22 0532 06/04/22 0645 06/04/22 0905 06/04/22 1141   BP: 115/67 122/68 133/59 133/66   Pulse: 82 90 98 85   Resp:  15  15   Temp: 97.7 F (36.5 C) 98.6 F (37 C)  97.3 F (36.3 C)   TempSrc: Oral Oral  Oral   SpO2: 99% 95%  100%   Weight:       Height:  General: NAD, AAOx3  HEENT: Sclera anicteric, no conjunctival injection, OP: Clear, MMM  Neck: Supple, FROM, no LAD  Cardiovascular: RRR, no m/r/g  Lungs: CTAB, no w/r/r  Abdomen: Soft, +BS, NT/ND, no masses, no g/r  Extremities: No C/C/E  Skin: No rashes or lesions noted  Neuro: No Focal neurological deficits       Diagnostics     Labs/Studies Pending at Discharge: No    Last Labs   Recent Labs   Lab 06/03/22  0423 06/02/22  0514 06/01/22  0315   WBC 11.42* 10.96* 12.58*   RBC 2.75* 2.88* 3.07*   Hgb 8.6* 8.9* 9.7*   Hematocrit 26.8* 28.1* 29.4*   MCV 97.5* 97.6* 95.8   Platelets 196 224 259       Recent Labs   Lab 06/03/22  0423 06/02/22  0514 06/01/22  0315 05/31/22  0515 05/30/22  0453   Sodium 137 140 139 139 139   Potassium 3.6 3.6 3.8 4.0 3.8   Chloride 101 102 98* 97* 98*   CO2 25 25 24 24 24    BUN 25.0* 19.0 48.0* 44.0* 50.0*   Creatinine 9.4* 7.6* 14.2* 13.6* 14.3*   Glucose 147* 117* 130* 185* 111*   Calcium 8.4 8.4 8.7 8.9 8.7   Magnesium  --   --  2.2  --  2.3       Microbiology Results (last 15 days)       Procedure Component Value Units Date/Time    Clostridium difficile toxin B PCR [[id number]]     Order Status: Canceled Specimen: Stool     Culture + Gram Stain,Aerobic, Body Fluid [[id number]] Collected: 05/27/22 1321    Order Status: Completed Specimen: Body Fluid from Peritoneal Fluid (Abdominal) Updated: 05/31/22 1301    Narrative:      ORDER#: [id number]                                     ORDERED BY: Georgiana Shore  SOURCE: Peritoneal Fluid (Abdominal) peritoneal fluidCOLLECTED:  05/27/22 13:21  ANTIBIOTICS AT COLL.:                                RECEIVED :  05/27/22 16:33  Stain, Gram                                FINAL       05/27/22 18:12  05/27/22   Few WBCs             No organisms seen             Stain performed on Cytospin (concentrated) specimen  Culture and Gram Stain, Aerobic, Body FluidFINAL       05/31/22 13:01  05/31/22   No growth      Urine culture [[id number]] Collected: 05/26/22 2234    Order Status: Completed Specimen: Bladder Updated: 05/28/22 1742    Narrative:      ORDER#: [id number]                                    ORDERED BY: Pearletha Furl  SOURCE: Urine  COLLECTED:  05/26/22 22:34  ANTIBIOTICS AT COLL.:                                RECEIVED :  05/27/22 14:13  Culture Urine                              FINAL       05/28/22 17:42  05/28/22   10,000 - 30,000 CFU/MLof normal urogenital or skin microbiota, no             further work      Culture + Gram Stain,Aerobic, Body Fluid [[id number]]     Order Status: Canceled Specimen: Body Fluid from Peritoneal Fluid (Abdominal)     Culture Blood Aerobic and Anaerobic [[id number]] Collected: 05/26/22 1604    Order Status: Completed Specimen: Arm from Blood, Venipuncture Updated: 05/31/22 2221    Narrative:      ORDER#: [id number]                                    ORDERED BY: Caroline More  SOURCE: Blood, Venipuncture Arm                      COLLECTED:  05/26/22 16:04  ANTIBIOTICS AT COLL.:                                RECEIVED :  05/26/22 19:56  Culture Blood Aerobic and Anaerobic        FINAL       05/31/22 22:21  05/31/22   No growth after 5 days of incubation.      COVID-19 (SARS-CoV-2) and Influenza A/B, NAA (Liat Rapid) [[id number]] Collected: 05/26/22 1535    Order Status: Completed Specimen: Culturette from Nasopharyngeal Updated: 05/26/22 1601     Purpose of  COVID testing Diagnostic -[facility]     SARS-CoV-2 Specimen Source Nasal Swab     SARS CoV 2 Overall Result Not Detected     Comment: __________________________________________________  -A result of "Detected" indicates POSITIVE for the    presence of SARS CoV-2 RNA  -A result of "Not Detected" indicates NEGATIVE for the    presence of SARS CoV-2 RNA  __________________________________________________________  Test performed using the Roche cobas Liat SARS-CoV-2 assay. This assay is  only for use under the Food and Drug Administration's Emergency Use  Authorization. This is a real-time RT-PCR assay for the qualitative  detection of SARS-CoV-2 RNA. Viral nucleic acids may persist in vivo,  independent of viability. Detection of viral nucleic acid does not imply the  presence of infectious virus, or that virus nucleic acid is the cause of  clinical symptoms. Negative results do not preclude SARS-CoV-2 infection and  should not be used as the sole basis for diagnosis, treatment or other  patient management decisions. Negative results must be combined with  clinical observations, patient history, and/or epidemiological information.  Invalid results may be due to inhibiting substances in the specimen and  recollection should occur. Please see Fact Sheets for patients and providers  located:  WirelessDSLBlog.no          Influenza A Not Detected     Influenza B Not Detected     Comment: Test performed using the  Roche cobas Liat SARS-CoV-2 & Influenza A/B assay.  This assay is only for use under the Food and Drug Administration's  Emergency Use Authorization. This is a multiplex real-time RT-PCR assay  intended for the simultaneous in vitro qualitative detection and  differentiation of SARS-CoV-2, influenza A, and influenza B virus RNA. Viral  nucleic acids may persist in vivo, independent of viability. Detection of  viral nucleic acid does not imply the presence of infectious virus, or that  virus  nucleic acid is the cause of clinical symptoms. Negative results do  not preclude SARS-CoV-2, influenza A, and/or influenza B infection and  should not be used as the sole basis for diagnosis, treatment or other  patient management decisions. Negative results must be combined with  clinical observations, patient history, and/or epidemiological information.  Invalid results may be due to inhibiting substances in the specimen and  recollection should occur. Please see Fact Sheets for patients and providers  located: http://www.rice.biz/.         Narrative:      o Collect and clearly label specimen type:  o PREFERRED-Upper respiratory specimen: One Nasal Swab in  Transport Media.  o Hand deliver to laboratory ASAP  Diagnostic -[facility]    Culture Blood Aerobic and Anaerobic [[id number]] Collected: 05/26/22 1526    Order Status: Completed Specimen: Arm from Blood, Venipuncture Updated: 05/31/22 2221    Narrative:      ORDER#: [id number]                                    ORDERED BY: Caroline More  SOURCE: Blood, Venipuncture Arm                      COLLECTED:  05/26/22 15:26  ANTIBIOTICS AT COLL.:                                RECEIVED :  05/26/22 19:56  Culture Blood Aerobic and Anaerobic        FINAL       05/31/22 22:21  05/31/22   No growth after 5 days of incubation.      Culture Blood Aerobic and Anaerobic [[id number]]     Order Status: Canceled Specimen: Arm from Blood, Venipuncture     Culture, Anaerobic Bacteria [[id number]] Collected: 05/26/22 0000    Order Status: Canceled Specimen: Other from Peritoneal Fluid (Abdominal)              Patient Instructions   Discharge Diet: Renal carb controlled diet  Discharge Activity: As tolerated    Follow Up Appointment:   Follow-up Information       [facility]. Call in 1 day(s).    Specialty: Family Medicine  Why: Needs PCP and TCM to f/u in the community  Contact information:  [name]  [address]  [place]  [address]  [phone]  Additional information:  From [address] - take [address] Go west on [address]. Go through the intersection with [address]. Destination is on your left.      From [address] - go east on [address] past [employer]. Destination is on your right.             Delice Bison, MD Follow up in 1 week(s).    Specialties: Internal  Medicine, Nephrology  Contact information:  [address]  [address] [address]  [phone]               follow up with your urologist Follow up in 1 week(s).               Pcp, [place], MD .               [facility] Follow up on 06/05/2022.    Specialty: Dialysis Clinic  Why: You have been scheduled for outpatient dialysis every Tuesday, Thurday and Saturday at 12:45pm.  Plan to arrive early enought to allow time for paperwork and registration.  You must present 2 forms of ID, your insurance card & list of medications you are currently taking.  The stafff at the clinic suggest that you wear comfortble, loose fitting clothing, have earphones for your electronic device and a blanket for your personal comfort.  Contact information:  [address]  [place] [address]  [phone]                            Discharge Medications:     Medication List        START taking these medications      hyoscyamine 0.125 MG tablet  Commonly known as: LEVSIN  Take 1 tablet (0.125 mg) by mouth every 4 (four) hours as needed for Cramping     lactobacillus/streptococcus Caps  Take 1 capsule by mouth daily for 5 days     ondansetron 4 MG tablet  Commonly known as: Zofran  Take 1 tablet (4 mg) by mouth every 8 (eight) hours as needed for Nausea            CHANGE how you take these medications      Insulin Degludec 200 UNIT/ML Sopn  Inject 12 Unit into the skin daily  What changed: how much to take            CONTINUE taking these medications      allopurinol 100 MG tablet  Commonly known as: ZYLOPRIM     AMBIEN PO      Anoro Ellipta 62.5-25 MCG/ACT Aepb  Generic drug: umeclidinium-vilanterol     atorvastatin 40 MG tablet  Commonly known as: LIPITOR     B complex-vitamin C-folic acid 1 MG tablet     cinacalcet 30 MG tablet  Commonly known as: SENSIPAR     clopidogrel 75 mg tablet  Commonly known as: PLAVIX     conjugated estrogens vaginal cream  Commonly known as: PREMARIN     insulin aspart 100 UNIT/ML injection  Commonly known as: NovoLOG     metoprolol tartrate 25 MG tablet  Commonly known as: LOPRESSOR     montelukast 10 MG tablet  Commonly known as: SINGULAIR     oxybutynin XL 5 MG 24 hr tablet  Commonly known as: DITROPAN-XL     pantoprazole 40 MG tablet  Commonly known as: PROTONIX     Polysaccharide Iron Complex 150 MG Tabs     sevelamer carbonate 800 MG tablet  Commonly known as: RENVELA     torsemide 100 MG tablet  Commonly known as: DEMADEX            STOP taking these medications      potassium chloride 10 MEQ CR tablet  Commonly known as: K-TAB     spironolactone 25 MG tablet  Commonly known as: ALDACTONE            ASK your doctor about these medications      pregabalin 50 MG capsule  Commonly known as: LYRICA               Where to Get Your Medications        These medications were sent to [id number], [place] - [address]  [address], [place] [address]      Phone: [phone]   hyoscyamine 0.125 MG tablet  lactobacillus/streptococcus Caps  ondansetron 4 MG tablet       Information about where to get these medications is not yet available    Ask your nurse or doctor about these medications  Insulin Degludec 200 UNIT/ML Sopn          Time spent examining patient, discussing with patient/family regarding hospital course, chart review, reconciling medications and discharge planning: 45 minutes.    Signed,  Gayleen Orem, MD    2:52 PM 06/04/2022

## 2022-06-04 NOTE — Plan of Care (Signed)
NURSING SHIFT NOTE     Patient: Shelby Barron  Day: 9      SHIFT EVENTS     Shift Narrative/Significant Events (PRN med administration, fall, RRT, etc.):   Patient complain of sever abdominal pain and nausea prn Dilaudid and Zofran given as ordered. Patient assisted to the bathroom. Safety and fall precautions remain in place. Purposeful rounding completed.          ASSESSMENT     Changes in assessment from patient's baseline this shift:    Neuro: No  CV: No  Pulm: No  Peripheral Vascular: No  HEENT: No  GI: No  BM during shift: No, Last BM: Last BM Date: 06/03/22  GU: No   Integ: No  MS: No    Pain: Improved  Pain Interventions: Medications  Medications Utilized: Dilaudid intravenous    Mobility: PMP Activity: Step 6 - Walks in Room of Distance Walked (ft) (Step 6,7): 20 Feet           Lines     Patient Lines/Drains/Airways Status       Active Lines, Drains and Airways       Name Placement date Placement time Site Days    Permacath Catheter - Tunneled 06/01/22 Internal Jugular Right 06/01/22  0956  -- 2    Peripheral IV 05/29/22 20 G Right Antecubital 05/29/22  1620  Antecubital  5    Peritoneal Dialysis Catheter Mid lower abdomen 05/26/22  2000  -- 8                         VITAL SIGNS     Vitals:    06/03/22 2152   BP: 113/71   Pulse: 91   Resp:    Temp: 98.4 F (36.9 C)   SpO2: 99%       Temp  Min: 97.4 F (36.3 C)  Max: 98.4 F (36.9 C)  Pulse  Min: 61  Max: 95  Resp  Min: 16  Max: 18  BP  Min: 103/48  Max: 145/73  SpO2  Min: 95 %  Max: 100 %      Intake/Output Summary (Last 24 hours) at 06/04/2022 0449  Last data filed at 06/03/2022 1516  Gross per 24 hour   Intake 360 ml   Output 1500 ml   Net -1140 ml          CARE PLAN       Problem: Pain interferes with ability to perform ADL  Goal: Pain at adequate level as identified by patient  Outcome: Progressing     Problem: Side Effects from Pain Analgesia  Goal: Patient will experience minimal side effects of analgesic therapy  Outcome: Progressing     Problem:  Moderate/High Fall Risk Score >5  Goal: Patient will remain free of falls  Outcome: Progressing     Problem: Renal Instability  Goal: Fluid and electrolyte balance are achieved/maintained  Outcome: Progressing     Problem: Patient Receiving Advanced Renal Therapies  Goal: Therapy access site remains intact  Outcome: Progressing     Problem: Compromised Activity/Mobility  Goal: Activity/Mobility Interventions  Outcome: Progressing     Problem: Fluid and Electrolyte Imbalance/ Endocrine  Goal: Fluid and electrolyte balance are achieved/maintained  Outcome: Progressing  Goal: Adequate hydration  Outcome: Progressing     Problem: Compromised Sensory Perception  Goal: Sensory Perception Interventions  Outcome: Progressing     Problem: Compromised Moisture  Goal: Moisture level Interventions  Outcome: Progressing     Problem: Compromised Friction/Shear  Goal: Friction and Shear Interventions  Outcome: Progressing     Problem: Compromised Nutrition  Goal: Nutrition Interventions  Outcome: Progressing

## 2022-06-04 NOTE — Progress Notes (Addendum)
Office: [phone]  Epic GroupChat: "[facility])"    Date Time: 06/04/22 @NOW   Patient Name: Shelby Barron, Shelby Barron      Problem List:      Admission with abd pain  Recently recognized bladder cancer with extension to ureter  Pyuria/ hematuria              -- recent dx of "UTI" treated with levaquin    -- outpt ucx grew "staph species", not aureus              -- prior h/o enterococcal UTI (march 2024)                          -- amp sensitive   -- Ucx 5/8 10-30,000 urogenital flora. But after abx   -- bcx NGTD     PD fluid clear  -------------------------------------  Chronic conditions  Diabetes  Hypertension  H/O CVA  ESRD, on peritoneal dialysis  Prior h/o renal cell CA on left  -- s/p partial L nephrectomy  Gout  COPD  Allergy to sulfa      Interval Events/Subjective:   NAEO   Afebrile   WBCs 11 on last check    Had two soft bowel movements yesterday, but not watery or frequent like prior. None today.    Antimicrobials:     None currently    Prior  #2 PO Vanc 5/15  #2 levaquin 250mg  daily 5/14- 5/15  #4 zosyn 5/11-> stop  Vanc therapeutic 5/8-5/11 at least  Cefepime 1 gram IV daily #4     Estimated Creatinine Clearance: 4.8 mL/min (A) (based on SCr of 9.4 mg/dL (H)).    Assessment:     Elderly lady with bladder cancer with extension into ureters with pyuria/ hematuria. No fever but has leucocytosis. It may be that the active urine sediment may be due to tumor but she is at risk for infection and has had recent abx, which may not make the current cx acurate.     She does have a prior h/o enterococcus in the urine in 03/2022. Also grew "staph species", not aureus, in her outpatient urine (discussed with Dr. Thad Ranger), sensitive to bactrim and levaquin, just prior to admission.     Has received a total of 7d for each of her prior enterococcus (vanc -> zosyn) and the CONS (vanc-> levaquin). Clinical significance of these bacterial cx remains in question, unclear if she has true  UTI, as her pyuria may be due to her malignancy. Her urinary symptoms seem to have mostly improved, so have stopped abx.    2 days ago developed significant dirarrhea. However her diarrhea appears to have resolved after stopping abx with only 2 doses of PO Vanc. Makes C Diff unlikely.     Plan:     - Stopped levaquin  - Stopped PO Vanc  - Defer C Diff testing as her diarrhea has resolved  - No further systemic abx at this time as her dysuria has improved    Thank you for this consult. We will sign off at this time. Please call if any ID issues or questions arise.     Lines:     Patient Lines/Drains/Airways Status       Active PICC Line / CVC Line / PIV Line / Drain / Airway / Intraosseous Line / Epidural Line / ART Line / Line / Wound / Pressure Ulcer / NG/OG Tube  Name Placement date Placement time Site Days    Peripheral IV 05/29/22 20 G Right Antecubital 05/29/22  1620  Antecubital  1    Peritoneal Dialysis Catheter Mid lower abdomen 05/26/22  2000  -- 4                    *I have performed a risk-benefit analysis and the patient needs a central line for access and IV medications      Review of Systems:   General ROS: negative for - chills, fevers, night sweats, weight loss   HEENT: negative for - blurry vision, sore throat, thrush   Respiratory ROS: negative for cough, SOB  Cardiovascular ROS: negative for - chest pain, palpitations   Gastrointestinal ROS: negative for - abdominal pain, nausea, vomiting, diarrhea  Genito-Urinary ROS: negative for - dysuria, urinary frequency/urgency   Musculoskeletal ROS: negative for - joint pain, joint stiffness or muscle pain   Dermatological ROS: negative for - rash and skin lesion changes   Neurological ROS: negative for - confusion, headache, dizziness  Hematological ROS: negative for - bruising, bleeding   Psychological ROS: negative for - changes in mood    Physical Exam:     Vitals:    06/04/22 1141   BP: 133/66   Pulse: 85   Resp: 15   Temp: 97.3 F (36.3 C)    SpO2: 100%       Awake, alert, no distress, cooperative.    HENT normal. Mouth moist, face symmetric  Lungs coarse breath sounds, without rales, wheezing  Cv normal without murmur, rub, or gallop  Abdomen soft nontender, PD cath site not inflamed  Extrem normal without phlebitis or edema.   No rash.   Neurologic exam non-focal.   Normal mental status.         Family History:     Family History   Problem Relation Age of Onset    Cancer Mother     Heart disease Father     Cancer Maternal Aunt     Diabetes Maternal Aunt     Cancer Paternal Aunt        Social History:     Social History     Socioeconomic History    Marital status: Married     Spouse name: Not on file    Number of children: Not on file    Years of education: Not on file    Highest education level: Not on file   Occupational History    Not on file   Tobacco Use    Smoking status: Former    Smokeless tobacco: Never   Vaping Use    Vaping status: Never Used   Substance and Sexual Activity    Alcohol use: No    Drug use: No    Sexual activity: Not on file   Other Topics Concern    Not on file   Social History Narrative    Not on file     Social Determinants of Health     Financial Resource Strain: Not on file   Food Insecurity: No Food Insecurity (05/26/2022)    Hunger Vital Sign     Worried About Running Out of Food in the Last Year: Never true     Ran Out of Food in the Last Year: Never true   Transportation Needs: No Transportation Needs (05/26/2022)    PRAPARE - Transportation     Lack of Transportation (Medical): No     Lack of  Transportation (Non-Medical): No   Physical Activity: Not on file   Stress: Not on file   Social Connections: Unknown (02/01/2022)    Received from [facility]    Social Network     Social Network: Not on file   Intimate Partner Violence: Not At Risk (05/26/2022)    Humiliation, Afraid, Rape, and Kick questionnaire     Fear of Current or Ex-Partner: No     Emotionally Abused: No     Physically Abused: No     Sexually Abused: No    Housing Stability: Unknown (05/26/2022)    Housing Stability Vital Sign     Unable to Pay for Housing in the Last Year: No     Number of Places Lived in the Last Year: Not on file     Unstable Housing in the Last Year: No       Allergies:     Allergies   Allergen Reactions    Sulfa Antibiotics Hives       Labs:     Lab Results   Component Value Date    WBC 11.42 (H) 06/03/2022    HGB 8.6 (L) 06/03/2022    HCT 26.8 (L) 06/03/2022    MCV 97.5 (H) 06/03/2022    PLT 196 06/03/2022     Lab Results   Component Value Date    CREAT 9.4 (H) 06/03/2022     Lab Results   Component Value Date    ALT 13 05/26/2022    AST 18 05/26/2022    ALKPHOS 78 05/26/2022    BILITOTAL 0.6 05/26/2022     Lab Results   Component Value Date    LACTATE 2.0 05/26/2022       Microbiology:     Microbiology Results (last 15 days)       Procedure Component Value Units Date/Time    Clostridium difficile toxin B PCR [[id number]]     Order Status: Canceled Specimen: Stool     Culture + Gram Stain,Aerobic, Body Fluid [[id number]] Collected: 05/27/22 1321    Order Status: Completed Specimen: Body Fluid from Peritoneal Fluid (Abdominal) Updated: 05/31/22 1301    Narrative:      ORDER#: [id number]                                    ORDERED BY: Georgiana Shore  SOURCE: Peritoneal Fluid (Abdominal) peritoneal fluidCOLLECTED:  05/27/22 13:21  ANTIBIOTICS AT COLL.:                                RECEIVED :  05/27/22 16:33  Stain, Gram                                FINAL       05/27/22 18:12  05/27/22   Few WBCs             No organisms seen             Stain performed on Cytospin (concentrated) specimen  Culture and Gram Stain, Aerobic, Body FluidFINAL       05/31/22 13:01  05/31/22   No growth      Urine culture [[id number]] Collected: 05/26/22 2234    Order Status: Completed Specimen: Bladder Updated: 05/28/22 1742    Narrative:  ORDER#: [id number]                                    ORDERED BY: GOARD, JODY  SOURCE: Urine                                         COLLECTED:  05/26/22 22:34  ANTIBIOTICS AT COLL.:                                RECEIVED :  05/27/22 14:13  Culture Urine                              FINAL       05/28/22 17:42  05/28/22   10,000 - 30,000 CFU/MLof normal urogenital or skin microbiota, no             further work      Culture + Gram Stain,Aerobic, Body Fluid [[id number]]     Order Status: Canceled Specimen: Body Fluid from Peritoneal Fluid (Abdominal)     Culture Blood Aerobic and Anaerobic [[id number]] Collected: 05/26/22 1604    Order Status: Completed Specimen: Arm from Blood, Venipuncture Updated: 05/31/22 2221    Narrative:      ORDER#: [id number]                                    ORDERED BY: Caroline More  SOURCE: Blood, Venipuncture Arm                      COLLECTED:  05/26/22 16:04  ANTIBIOTICS AT COLL.:                                RECEIVED :  05/26/22 19:56  Culture Blood Aerobic and Anaerobic        FINAL       05/31/22 22:21  05/31/22   No growth after 5 days of incubation.      COVID-19 (SARS-CoV-2) and Influenza A/B, NAA (Liat Rapid) [[id number]] Collected: 05/26/22 1535    Order Status: Completed Specimen: Culturette from Nasopharyngeal Updated: 05/26/22 1601     Purpose of COVID testing Diagnostic -PUI     SARS-CoV-2 Specimen Source Nasal Swab     SARS CoV 2 Overall Result Not Detected     Comment: __________________________________________________  -A result of "Detected" indicates POSITIVE for the    presence of SARS CoV-2 RNA  -A result of "Not Detected" indicates NEGATIVE for the    presence of SARS CoV-2 RNA  __________________________________________________________  Test performed using the Roche cobas Liat SARS-CoV-2 assay. This assay is  only for use under the Food and Drug Administration's Emergency Use  Authorization. This is a real-time RT-PCR assay for the qualitative  detection of SARS-CoV-2 RNA. Viral nucleic acids may persist in vivo,  independent of viability. Detection of viral nucleic acid does not imply  the  presence of infectious virus, or that virus nucleic acid is the cause of  clinical symptoms. Negative results do not preclude SARS-CoV-2 infection and  should not be used as the  sole basis for diagnosis, treatment or other  patient management decisions. Negative results must be combined with  clinical observations, patient history, and/or epidemiological information.  Invalid results may be due to inhibiting substances in the specimen and  recollection should occur. Please see Fact Sheets for patients and providers  located:  WirelessDSLBlog.no          Influenza A Not Detected     Influenza B Not Detected     Comment: Test performed using the Roche cobas Liat SARS-CoV-2 & Influenza A/B assay.  This assay is only for use under the Food and Drug Administration's  Emergency Use Authorization. This is a multiplex real-time RT-PCR assay  intended for the simultaneous in vitro qualitative detection and  differentiation of SARS-CoV-2, influenza A, and influenza B virus RNA. Viral  nucleic acids may persist in vivo, independent of viability. Detection of  viral nucleic acid does not imply the presence of infectious virus, or that  virus nucleic acid is the cause of clinical symptoms. Negative results do  not preclude SARS-CoV-2, influenza A, and/or influenza B infection and  should not be used as the sole basis for diagnosis, treatment or other  patient management decisions. Negative results must be combined with  clinical observations, patient history, and/or epidemiological information.  Invalid results may be due to inhibiting substances in the specimen and  recollection should occur. Please see Fact Sheets for patients and providers  located: http://www.rice.biz/.         Narrative:      o Collect and clearly label specimen type:  o PREFERRED-Upper respiratory specimen: One Nasal Swab in  Transport Media.  o Hand deliver to laboratory ASAP  Diagnostic -PUI    Culture  Blood Aerobic and Anaerobic [[id number]] Collected: 05/26/22 1526    Order Status: Completed Specimen: Arm from Blood, Venipuncture Updated: 05/31/22 2221    Narrative:      ORDER#: [id number]                                    ORDERED BY: Caroline More  SOURCE: Blood, Venipuncture Arm                      COLLECTED:  05/26/22 15:26  ANTIBIOTICS AT COLL.:                                RECEIVED :  05/26/22 19:56  Culture Blood Aerobic and Anaerobic        FINAL       05/31/22 22:21  05/31/22   No growth after 5 days of incubation.      Culture Blood Aerobic and Anaerobic [[id number]]     Order Status: Canceled Specimen: Arm from Blood, Venipuncture     Culture, Anaerobic Bacteria [[id number]] Collected: 05/26/22 0000    Order Status: Canceled Specimen: Other from Peritoneal Fluid (Abdominal)             Rads:   No results found.    Thank you for this interesting consult. During the week, please reach out to author of the most recent ID note.     Signed by: Armen Pickup, MD

## 2022-06-07 ENCOUNTER — Ambulatory Visit (INDEPENDENT_AMBULATORY_CARE_PROVIDER_SITE_OTHER): Payer: Medicare Other | Admitting: Family

## 2022-06-07 ENCOUNTER — Encounter (INDEPENDENT_AMBULATORY_CARE_PROVIDER_SITE_OTHER): Payer: Self-pay

## 2022-06-21 ENCOUNTER — Ambulatory Visit
Admission: RE | Admit: 2022-06-21 | Discharge: 2022-06-21 | Disposition: A | Discharge status: Home / Self Care | Payer: No Typology Code available for payment source | Source: Ambulatory Visit | Attending: Urology | Admitting: Urology

## 2022-06-21 DIAGNOSIS — C642 Malignant neoplasm of left kidney, except renal pelvis: Secondary | ICD-10-CM | POA: Insufficient documentation

## 2022-06-21 DIAGNOSIS — C679 Malignant neoplasm of bladder, unspecified: Secondary | ICD-10-CM | POA: Insufficient documentation

## 2022-06-21 LAB — URINALYSIS
Bilirubin, UA: NEGATIVE
Glucose, UA: NEGATIVE
Ketones UA: NEGATIVE
Nitrite, UA: NEGATIVE
Specific Gravity UA: 1.014 (ref 1.001–1.035)
Urine pH: 8.5 — AB (ref 5.0–8.0)
Urobilinogen, UA: NORMAL mg/dL

## 2022-06-23 ENCOUNTER — Encounter (INDEPENDENT_AMBULATORY_CARE_PROVIDER_SITE_OTHER): Payer: Self-pay | Admitting: Cardiovascular Disease

## 2022-06-23 ENCOUNTER — Ambulatory Visit (INDEPENDENT_AMBULATORY_CARE_PROVIDER_SITE_OTHER): Payer: No Typology Code available for payment source | Admitting: Cardiovascular Disease

## 2022-06-23 VITALS — BP 128/62 | HR 82 | Ht 62.0 in | Wt 187.0 lb

## 2022-06-23 DIAGNOSIS — I700 Atherosclerosis of aorta: Secondary | ICD-10-CM

## 2022-06-23 DIAGNOSIS — Z01810 Encounter for preprocedural cardiovascular examination: Secondary | ICD-10-CM

## 2022-06-23 DIAGNOSIS — I5032 Chronic diastolic (congestive) heart failure: Secondary | ICD-10-CM

## 2022-06-23 DIAGNOSIS — R0602 Shortness of breath: Secondary | ICD-10-CM

## 2022-06-23 NOTE — Progress Notes (Addendum)
[facility] OFFICE CONSULTATION NOTE    [facility] [place] OFFICE -CARDIOLOGY  [address]  [place] [address]  Dept: [phone]  Dept Fax: [phone]         Patient Name: Shelby Barron, Shelby Barron    Date of Visit:  June 23, 2022  Date of Birth: [birth year]  AGE: 82 y.o.  Medical Record #: [id number]  Requesting Physician: PCP Not on File, MD      CHIEF COMPLAINT:  Pre-op Exam (Kidney & Bladder Removal Surgery/DOS 06/28/22 Surgeon: Dr. Celine Mans & Graciela Husbands ) and Shortness of Breath (New Patient )      HISTORY OF PRESENT ILLNESS    Shelby Barron is a 82 y.o. female being seen today for cardiovascular evaluation. She has history of hypertension, hyperlipidemia, congestive heart failure, [name], type 2 diabetes mellitus, GERD, s/p partial nephrectomy, and ESRD. She has bladder cancer and is on dialysis. She is scheduled for a cystectomy with urinary diversion on June 28, 2022 with urologist, Dr. Nonda Lou. She was living in [place] but has moved to [place] for cancer treatment. [place] her on metoprolol 25 mg. She had an episode recently where she experienced shortness of breath and took nitroglycerin. Shelby Barron has no complaints of chest pain, PND, or orthopnea.      PAST MEDICAL HISTORY: She has a past medical history of Acid reflux, Asthma, well controlled, Cancer of kidney, Chronic lower back pain, Congestive heart disease, COPD (chronic obstructive pulmonary disease), CVA (cerebral infarction) (01/12/2003), Diabetes, Diabetic nephropathy, Fibroids, Gout, H/O: gout, Hyperlipidemia, Hypertension, Neuropathy of hand, and SOB (shortness of breath). She has a past surgical history that includes Hernia repair; APPENDECTOMY (OPEN); Hysterectomy; tumor removed from kidney Marletta Lor 2015); Partial nephrectomy; TURBT; Drain (Other) (N/A, 05/31/2022); and Tunneled Cath Placement (Permcath)  (N/A, 06/01/2022).    ALLERGIES:   Allergies   Allergen Reactions    Sulfa Antibiotics Hives       MEDICATIONS:   Current Outpatient Medications:     allopurinol (ZYLOPRIM) 100 MG tablet, Take 1 tablet (100 mg) by mouth daily, Disp: , Rfl:     atorvastatin (LIPITOR) 40 MG tablet, Take 1 tablet (40 mg) by mouth daily, Disp: , Rfl:     clopidogrel (PLAVIX) 75 MG tablet, Take 1 tablet (75 mg) by mouth daily  PT ON HOLD, Disp: , Rfl:     conjugated estrogens (PREMARIN) vaginal cream, Place vaginally daily, Disp: , Rfl:     hyoscyamine (LEVSIN) 0.125 MG tablet, Take 1 tablet (0.125 mg) by mouth every 4 (four) hours as needed for Cramping, Disp: 5 tablet, Rfl: 0    insulin aspart (NOVOLOG) 100 UNIT/ML injection, Inject 5-10 Units into the skin every evening, Disp: , Rfl:     Insulin Degludec Evaristo Bury) 100 UNIT/ML Solution, Inject into the skin daily, Disp: , Rfl:     metoprolol tartrate (LOPRESSOR) 25 MG tablet, Take 0.5 tablets (12.5 mg) by mouth daily, Disp: , Rfl:     montelukast (SINGULAIR) 10 MG tablet, Take 1 tablet (10 mg) by mouth nightly, Disp: , Rfl:     omeprazole (PriLOSEC) 40 MG capsule, Take 1 capsule (40 mg) by mouth daily, Disp: , Rfl:     ondansetron (Zofran) 4 MG tablet, Take 1 tablet (4 mg) by mouth every 8 (eight) hours as needed for Nausea, Disp: 20 tablet, Rfl: 0    oxybutynin (DITROPAN-XL)  10 MG 24 hr tablet, Take 1 tablet (10 mg) by mouth daily, Disp: , Rfl:     pregabalin (LYRICA) 50 MG capsule, Take 1 capsule (50 mg) by mouth Per pt has not been taking, Disp: , Rfl:     torsemide (DEMADEX) 100 MG tablet, Take 1 tablet (100 mg) by mouth daily, Disp: , Rfl:     umeclidinium-vilanterol (Anoro Ellipta) 62.5-25 MCG/ACT Aerosol Pwdr, Breath Activated, Inhale 1 puff into the lungs daily, Disp: , Rfl:     Zolpidem Tartrate (AMBIEN PO), Take 5 mg by mouth as needed, Disp: , Rfl:      FAMILY HISTORY: family history includes Cancer in her maternal aunt, mother, and paternal aunt; Diabetes in her maternal  aunt; Heart failure in her father.    SOCIAL HISTORY: She reports that she has quit smoking. She has never used smokeless tobacco. She reports that she does not drink alcohol and does not use drugs.    REVIEW OF SYSTEMS:   No data recorded       PHYSICAL EXAMINATION    Visit Vitals  BP 128/62 (BP Site: Left arm, Patient Position: Sitting, Cuff Size: Medium)   Pulse 82   Ht 1.575 m (5\' 2" )   Wt 84.8 kg (187 lb)   SpO2 95%   BMI 34.20 kg/m      General Appearance:  A well-appearing female in no acute distress.    Skin: Warm and dry to touch, no apparent skin lesions, or masses noted.  Head: Normocephalic, normal hair pattern, no masses or tenderness   Eyes: EOMS Intact, PERRL, conjunctivae and lids unremarkable.  ENT: Ears, Nose and throat reveal no gross abnormalities.  No pallor or cyanosis.  Neck: JVP normal, no carotid bruit, thyroid not enlarged   Chest: Clear to auscultation bilaterally with good air movement and respiratory effort and no wheezes, rales, or rhonchi   Cardiovascular: Regular rhythm, S1 normal in intensity, S2 splits normally with respirations, no S3 gallop. No diastolic murmurs or rubs. Apical S4. Grade 2/6 systolic murmur right second intercostal space.   Abdomen: Soft, nontender, nondistended, with normoactive bowel sounds. No organomegaly.  No pulsatile masses, or bruits. PD catheter LLQ. Tunnel catheter in place in right subclavian for dialysis.   Extremities: Warm without edema. No clubbing, or cyanosis. All peripheral pulses are full and equal.   Neuro: Alert and oriented x3. No gross motor or sensory deficits noted, affect appropriate.      EKG: Sinus rhythm, rate 82 bpm, poor R wave progression anteriorly.      LABS:     The following labs were reviewed by me during the visit.    Lab Results   Component Value Date    CHOL 209 (H) 08/17/2013    TRIG 203 (H) 08/17/2013    HDL 46 08/17/2013    LDL 122 (H) 08/17/2013     Lab Results   Component Value Date    GLU 147 (H) 06/03/2022    BUN  25.0 (H) 06/03/2022    CREAT 9.4 (H) 06/03/2022    NA 137 06/03/2022    K 3.6 06/03/2022    CL 101 06/03/2022    CO2 25 06/03/2022    AST 18 05/26/2022    ALT 13 05/26/2022     Lab Results   Component Value Date    WBC 11.42 (H) 06/03/2022    HGB 8.6 (L) 06/03/2022    HCT 26.8 (L) 06/03/2022    PLT 196 06/03/2022  Lab Results   Component Value Date    MG 2.2 06/01/2022    HGBA1C 8.5 (H) 05/27/2022    BNP 696 (H) 05/26/2022       IMPRESSION:     Pre-op exam (Kidney & Bladder Removal Surgery on June 28, 2022)  Hypertensions  Hyperlipidemia  CVA  Type 2 diabetes mellitus  Peritonitis  ESRD  Gout  Iron deficiency anemia      RECOMMENDATIONS:    Continue current mediation  Echocardiogram   If echocardiogram shows normal  left ventricular function she should be okay from a cardiac standpoint to proceed with surgery at moderate but not prohibitive risk.  Office visit in 1 month  General medical follow up with Dr. Olegario Shearer.                                                  Orders Placed This Encounter   Procedures    ECG 12 lead (Normal)    Echo 2D Complete       No orders of the defined types were placed in this encounter.        SIGNED:    Celesta Gentile. Letitia Neri, MD, [facility]        SCRIBE ATTESTATION:    This note has been prepared by Deberah Castle, acting as a scribe for Dr. Letitia Neri, who has reviewed for accuracy and agrees with the documentation above.    I, Dr. Letitia Neri, agree with the above documentation, which was reviewed for accuracy and completeness. Obtaining history, performing the physical exam, and formulating the assessment and plan were performed by me. I am the provider of record.     ADDENDUM 06/27/2022:  Echocardiogram done on 6/7 shows normal LVEF 65% with mild aortic valve stenosis. Would consider Shelby Barron moderate but not prohibitive risk from cardiac standpoint for urologic surgery scheduled for 06/28/2022.     Shelby Barron P. Letitia Neri MD.     This note was generated by the Dragon speech  recognition and may contain errors or omissions not intended by the user. Grammatical errors, random word insertions, deletions, pronoun errors, and incomplete sentences are occasional consequences of this technology due to software limitations. Not all errors are caught or corrected. If there are questions or concerns about the content of this note or information contained within the body of this dictation, they should be addressed directly with the author for clarification.

## 2022-06-24 LAB — ECG 12-LEAD
Atrial Rate: 82 {beats}/min
IHS MUSE NARRATIVE AND IMPRESSION: NORMAL
P Axis: 53 degrees
P-R Interval: 146 ms
Q-T Interval: 412 ms
QRS Duration: 72 ms
QTC Calculation (Bezet): 481 ms
R Axis: 17 degrees
T Axis: 25 degrees
Ventricular Rate: 82 {beats}/min

## 2022-06-25 ENCOUNTER — Ambulatory Visit
Admission: RE | Admit: 2022-06-25 | Discharge: 2022-06-25 | Disposition: A | Discharge status: Home / Self Care | Payer: No Typology Code available for payment source | Source: Ambulatory Visit | Attending: Family Nurse Practitioner | Admitting: Family Nurse Practitioner

## 2022-06-25 ENCOUNTER — Encounter (INDEPENDENT_AMBULATORY_CARE_PROVIDER_SITE_OTHER): Payer: Self-pay | Admitting: Family Nurse Practitioner

## 2022-06-25 ENCOUNTER — Ambulatory Visit (INDEPENDENT_AMBULATORY_CARE_PROVIDER_SITE_OTHER): Payer: No Typology Code available for payment source

## 2022-06-25 ENCOUNTER — Telehealth (INDEPENDENT_AMBULATORY_CARE_PROVIDER_SITE_OTHER): Payer: No Typology Code available for payment source

## 2022-06-25 ENCOUNTER — Encounter (INDEPENDENT_AMBULATORY_CARE_PROVIDER_SITE_OTHER): Payer: Self-pay | Admitting: Cardiovascular Disease

## 2022-06-25 ENCOUNTER — Ambulatory Visit (INDEPENDENT_AMBULATORY_CARE_PROVIDER_SITE_OTHER): Payer: No Typology Code available for payment source | Admitting: Family Nurse Practitioner

## 2022-06-25 VITALS — BP 135/74 | HR 79 | Temp 97.8°F | Resp 18 | Ht 63.0 in | Wt 192.0 lb

## 2022-06-25 DIAGNOSIS — Z794 Long term (current) use of insulin: Secondary | ICD-10-CM

## 2022-06-25 DIAGNOSIS — I5032 Chronic diastolic (congestive) heart failure: Secondary | ICD-10-CM

## 2022-06-25 DIAGNOSIS — Z01810 Encounter for preprocedural cardiovascular examination: Secondary | ICD-10-CM

## 2022-06-25 DIAGNOSIS — Z01818 Encounter for other preprocedural examination: Secondary | ICD-10-CM

## 2022-06-25 DIAGNOSIS — I10 Essential (primary) hypertension: Secondary | ICD-10-CM

## 2022-06-25 DIAGNOSIS — I700 Atherosclerosis of aorta: Secondary | ICD-10-CM

## 2022-06-25 DIAGNOSIS — D72829 Elevated white blood cell count, unspecified: Secondary | ICD-10-CM | POA: Insufficient documentation

## 2022-06-25 DIAGNOSIS — E1122 Type 2 diabetes mellitus with diabetic chronic kidney disease: Secondary | ICD-10-CM

## 2022-06-25 DIAGNOSIS — C642 Malignant neoplasm of left kidney, except renal pelvis: Secondary | ICD-10-CM

## 2022-06-25 DIAGNOSIS — Z992 Dependence on renal dialysis: Secondary | ICD-10-CM

## 2022-06-25 DIAGNOSIS — K219 Gastro-esophageal reflux disease without esophagitis: Secondary | ICD-10-CM

## 2022-06-25 DIAGNOSIS — N186 End stage renal disease: Secondary | ICD-10-CM

## 2022-06-25 DIAGNOSIS — R0602 Shortness of breath: Secondary | ICD-10-CM

## 2022-06-25 DIAGNOSIS — C679 Malignant neoplasm of bladder, unspecified: Secondary | ICD-10-CM

## 2022-06-25 LAB — ECHO ADULT TTE COMPLETE
AV Area (Cont Eq VTI): 1.66
AV Area (Cont Eq VTI): 1.66
AV Mean Gradient: 10
AV Mean Gradient: 11
AV Mean Gradient: 8
AV Mean Gradient: 9
AV Peak Velocity: 1.94
AV Peak Velocity: 1.98
AV Peak Velocity: 2.16
AV Peak Velocity: 2.23
Ao Root Diameter (2D): 2.6
BP Mod LV Ejection Fraction: 65
IVS Diastolic Thickness (2D): 1
LA Dimension (2D): 4.3
LA Volume Index (BP A-L): 33
LVID diastole (2D): 4.3
LVID systole (2D): 2.3
MV Area (PHT): 3.48
MV E/A: 0.9
MV E/A: 0.909
MV E/e' (Average): 13.062
Mitral Valve Findings: NORMAL
Prox Ascending Aorta Diameter: 3
Pulmonary Valve Findings: NORMAL
RV Basal Diastolic Dimension: 2.8
RV Function: NORMAL
Site RA Size (AS): NORMAL
Site RV Size (AS): NORMAL
TAPSE: 1.6
Tricuspid Valve Findings: NORMAL

## 2022-06-25 LAB — TYPE AND SCREEN
AB Screen Gel: NEGATIVE
ABO Rh: O POS

## 2022-06-25 NOTE — Discharge Instructions (Signed)
-  Do not wear jewelry/no valuables day of surgery  -Notify any change in your condition (illness, infection, wound) prior to surgery to your surgeon's office  -Avoid alcohol/tobacco __1 week_____  prior to surgery   -Do not wear anything on your skin including lotions/ointments/deodorant/makeup/nail polish/artificial nails day of surgery  -Do not take Aspirin 7 days before surgery  -No supplements/anti-inflammatories not limited to naproxen, motrin, ibuprofen etc. herbals for 7 days before surgery  -Hold vitamins day of surgery  -ok to take Tylenol

## 2022-06-25 NOTE — PEC In-Person Visit (H&P) (Addendum)
Pre-Anesthesia Evaluation     Pre-op Interview visit requested by:   Reason for pre-op interview visit: Patient anticipating ROBOT XI ASSISTED, LAPAROSCOPIC, CYSTECTOMY, RADICAL, TOTAL, W/URINARY DIVERSION AND CREATION OF ILLEAL LOOP CONDUIT, ROBOT XI ASSISTED, LAPAROSCOPIC, SIMPLE NEPHRECTOMY LEFT procedure.         History of Present Illness/Summary:  Patient presents to the [facility] clinic for a pre-operative evaluation.    Assessment/Plan:    1.  Encounter for pre-operative examination [Z01.818]  Orders Placed This Encounter   Procedures   . Type and Screen     2.  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  Hypertension/CHF, aortic atherosclerosis: Followed by [place] [facility]. Cardiac pre-op by Dr. Letitia Neri. Scheduled for echocardiogram today and will be cleared for surgery after echo result (per patient's daughter)    4.  ESRD: Dialysis days Tues, Thurs and Saturdaty ([place])    5. [name]: Followed by PCP    6. Type 2 diabetes mellitus: A1c 8.5. Will contact surgeon if he is okay to proceed given the necessity of the surgery. The patient reports compliance with medications and currently has no side effects to report     7. GERD: The patient reports compliance with medications and currently has no side effects to report     8. Leukocytosis: WBC seems to be baseline. No ongoing infection. Denies fever, chills or any associated symptoms    Patient is at elevated risk for perioperative cardiovascular complications 2/2 CAD, ESRD, HTN, CHF, DM2 and obesity, however, not prohibitive to proceeding with planned surgery or procedure upon final determination by cardiologist      Patient voiced understanding of all instructions.  All questions and concerns addressed at this time. This assessment will be conveyed to the surgery and anesthesiology teams & the patient will be evaluated the morning of surgery.      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   07/31/2010    Left renal mass N28.89   11/28/2012    Hamstring injury S76.309A   05/10/2013    Peritonitis K65.9   05/26/2022    ESRD (end stage renal disease) N18.6   05/26/2022    Chronic diastolic congestive heart failure I50.32   06/23/2022    Aortic atherosclerosis I70.0   06/23/2022    Malignant neoplasm of urinary bladder, unspecified site C67.9   06/25/2022    Malignant tumor of kidney, left C64.2   06/25/2022    Benign essential hypertension I10   02/10/2013    GERD (gastroesophageal reflux disease) K21.9   06/25/2022    Leukocytosis D72.829   06/25/2022        Medical History   Diagnosis Date   . Acid reflux    . Asthma, well controlled    . Cancer of kidney    . Chronic lower back pain    . Congestive heart disease    . COPD (chronic obstructive pulmonary disease)    .  CVA (cerebral infarction) 01/12/2003   . Diabetes    . Diabetic nephropathy    . Fibroids    . Gout    . H/O: gout    . Hyperlipidemia    . Hypertension    . Neuropathy of hand    . SOB (shortness of breath)      Past Surgical History:   Procedure Laterality Date   . APPENDECTOMY (OPEN)     . DRAIN (OTHER) N/A 05/31/2022    Procedure: DRAIN (OTHER);  Surgeon: Hope Pigeon, MD;  Location: [facility];  Service: Interventional Radiology;  Laterality: N/A;   . HERNIA REPAIR     . HYSTERECTOMY     . PARTIAL NEPHRECTOMY     . tumor removed from kidney  cJanuary 2015   . TUNNELED CATH PLACEMENT (PERMCATH) N/A 06/01/2022    Procedure: TUNNELED CATH PLACEMENT;  Surgeon: Suszanne Finch, MD;  Location: [facility];  Service: Interventional Radiology;  Laterality: N/A;   . TURBT           Medication List            Accurate as of June 25, 2022  2:56 PM. Always use your most recent med list.                allopurinol 100 MG tablet  Take 1 tablet (100 mg) by mouth daily  Commonly known as: ZYLOPRIM  Medication Adjustments for Surgery: Take morning of surgery     AMBIEN PO  Take 5 mg by mouth as needed  Medication Adjustments for Surgery: Take as needed     Anoro Ellipta 62.5-25 MCG/ACT Aepb  Inhale 1 puff into the lungs daily  Generic drug: umeclidinium-vilanterol  Medication Adjustments for Surgery: Take morning of surgery     atorvastatin 40 MG tablet  Take 1 tablet (40 mg) by mouth daily  Commonly known as: LIPITOR  Medication Adjustments for Surgery: Take as prescribed     b complex vitamins capsule  Take 1 capsule by mouth daily  Medication Adjustments for Surgery: Hold day of surgery     cinacalcet 30 MG tablet  Take 1 tablet (30 mg) by mouth daily  Commonly known as: SENSIPAR  Medication Adjustments for Surgery: Take as prescribed     clopidogrel 75 mg tablet  Take 1 tablet (75 mg) by mouth daily  Commonly known as: PLAVIX  Medication Adjustments for Surgery: Stop 7 days before surgery     conjugated estrogens vaginal cream  Place vaginally daily  Commonly known as: PREMARIN  Medication Adjustments for Surgery: Hold day of surgery     hyoscyamine 0.125 MG tablet  Take 1 tablet (0.125 mg) by mouth every 4 (four) hours as needed for Cramping  Commonly known as: LEVSIN  Medication Adjustments for Surgery: Take as needed     insulin aspart 100 UNIT/ML injection  Inject 5-10 Units into the skin as needed  Commonly known as: NovoLOG  Medication Adjustments for Surgery: Hold day of surgery     metoprolol tartrate 25 MG tablet  Take 0.5 tablets (12.5 mg) by mouth daily  Commonly known as: LOPRESSOR  Medication Adjustments for Surgery: Take morning of surgery     montelukast 10 MG tablet  Take 1 tablet (10 mg) by mouth nightly  Commonly known as: SINGULAIR  Medication Adjustments for Surgery: Take as  prescribed     ondansetron 4 MG tablet  Take 1 tablet (4 mg) by mouth every 8 (eight) hours as  needed for Nausea  Commonly known as: Zofran  Medication Adjustments for Surgery: Take as needed     oxybutynin XL 5 MG 24 hr tablet  Take 1 tablet (5 mg) by mouth daily  Commonly known as: DITROPAN-XL  Medication Adjustments for Surgery: Take as prescribed     pantoprazole 40 MG tablet  Take 1 tablet (40 mg) by mouth daily  Commonly known as: PROTONIX  Medication Adjustments for Surgery: Take morning of surgery     pregabalin 50 MG capsule  Take 1 capsule (50 mg) by mouth Per pt has not been taking  Commonly known as: LYRICA  Medication Adjustments for Surgery: Take as prescribed     sevelamer carbonate 800 MG tablet  Take 1 tablet (800 mg) by mouth 2 (two) times daily  Commonly known as: RENVELA  Medication Adjustments for Surgery: Hold day of surgery  Notes to patient: Patient not taking     torsemide 100 MG tablet  Take 1 tablet (100 mg) by mouth daily  Commonly known as: DEMADEX  Medication Adjustments for Surgery: Hold day of surgery     Tresiba 100 UNIT/ML Soln  Inject 36 Units into the skin daily  Generic drug: Insulin Degludec  Medication Adjustments for Surgery: Hold day of surgery            Allergies   Allergen Reactions   . Metrizamide Shortness Of Breath and Nausea Only   . Rosuvastatin Other (See Comments)     myopathy    myopathy   myopathy    myopathy myopathy      myopathy    myopathy, myopathy   . Iodinated Contrast Media Other (See Comments)     Nausea and SOB per patient   On Dialysisi    Nausea and SOB per patient    Nausea and SOB per patient      Nausea and SOB per patient   Nausea and SOB per patient    Nausea and SOB per patient      Nausea and SOB per patient Nausea and SOB per patient      Nausea and SOB per patient Nausea and SOB per patient Nausea and SOB per patient      Nausea and SOB per patient  Nausea and SOB per patient Nausea and SOB per patient    Nausea and SOB per patient   Nausea  and SOB per patient   . Pioglitazone      Weight gain   . Sulfa Antibiotics Hives     blister     Social History     Occupational History   . Not on file   Tobacco Use   . Smoking status: Former   . Smokeless tobacco: Never   Vaping Use   . Vaping status: Never Used   Substance and Sexual Activity   . Alcohol use: No   . Drug use: No   . Sexual activity: Not on file       Menstrual History:   LMP / Status  Postmenopausal     No LMP recorded. Patient is postmenopausal.    Tubal Ligation?  No valid surgical or medical questions entered.           Exam Scores:   SDB score  OSA Risk Category: No Risk        STBUR score       PONV score  Nausea Risk: VERY SEVERE RISK    MST score  MST Score: 2  PEN-FAST score       Frailty score  CFS Score: 4    MICA  MICA %: 4.18    RCRI score  RCRI Score: 3    DASI  DASI Score: 7.25  METs Level: 3.63    EA-DIVA  EA-DIVA Score: 5    CHADsVasc            Visit Vitals  BP 135/74   Pulse 79   Temp 97.8 F (36.6 C)   Resp 18   Ht 1.6 m (5\' 3" )   Wt 87.1 kg (192 lb)   SpO2 100%   BMI 34.01 kg/m       Review of Systems   Constitutional: Negative.    HENT: Negative.     Eyes: Negative.    Respiratory:  Positive for shortness of breath. Negative for stridor.    Cardiovascular: Negative.    Gastrointestinal:  Positive for nausea. Negative for abdominal pain.   Genitourinary: Negative.    Musculoskeletal: Negative.         Uses W/C   Skin: Negative.    Neurological: Negative.    Endo/Heme/Allergies: Negative.    Psychiatric/Behavioral:  The patient is nervous/anxious.        Physical Exam:  Mallampati: III  TM distance: > 3 FB (> 6 cm)  Mouth opening: > 3 FB (> 6 cm)  Neck extension: full  (-) enlarged neck circumference    Normal dentition    Normal neurological exam  Mental status: oriented x3 and alert  Speech: normal    Normal cardiovascular exam  Heart rhythm: regular  no murmur:  Normal S1-S2    Examined Pulses: left carotid - normal; right carotid - normal; left radial - normal; right  radial - normal  (-) a murmur, JVD, a carotid bruit and peripheral edema    Normal pulmonary exam  Breath sounds clear to auscultation bilaterally  (-) rhonchi, , decreased breath sounds, wheezing, rales and stridor    Normal thoracolumbar exam  (-) scoliosis, skin abnormalities,  and kyphosis  Normal extremity exam(-) external extremity abnormalities    Normal abdominal exam.  Patient is not obese.  Scaphoid abdomen not present.  Abdomen: soft    Peritoneal dialysis cath in place  Temp cath Left CW     Recent Labs   CBC (last 180 days) 06/02/22  0514 06/03/22  0423   WBC 10.96* 11.42*   RBC 2.88* 2.75*   Hgb 8.9* 8.6*   Hematocrit 28.1* 26.8*   MCV 97.6* 97.5*   MCH 30.9 31.3   MCHC 31.7 32.1   RDW 15 15   Platelets 224 196   MPV 9.6 9.1   Nucleated RBC 0.2* 0.0   Absolute NRBC 0.02* 0.00     Recent Labs   BMP (last 180 days) 06/02/22  0514 06/03/22  0423   Glucose 117* 147*   BUN 19.0 25.0*   Creatinine 7.6* 9.4*   Sodium 140 137   Potassium 3.6 3.6   Chloride 102 101   CO2 25 25   Calcium 8.4 8.4   Anion Gap 13.0 11.0   eGFR 4.9* 3.8*     Recent Labs   Coag Panel (last 180 days) 06/01/22  0315   PTT 27   PT 12.7   PT INR 1.1     Recent Labs   Other (last 180 days) 05/26/22  1526 05/26/22  1535 05/27/22  0334 06/02/22  0514   Bilirubin, Total 0.6  --   --   --  ALT 13  --   --   --    AST (SGOT) 18  --   --   --    Protein, Total 7.2  --   --   --    Hemoglobin A1C  --   --  8.5*  --    NT-proBNP 696*  --   --   --    Iron  --   --   --  62   Iron Saturation  --   --   --  43   SARS-CoV-2 Specimen Source  --  Nasal Swab  --   --    SARS CoV 2 Overall Result  --  Not Detected  --   --      Recent Labs   Type & Screen (last 34 days) 06/25/22  1342   ABO Rh O POS   AB Screen Gel NEG         Anesthesia Plan:  ASA 3   Anesthetic Options Discussed: General  Potential anesthesia/Perioperative problems: No Problems Identified          A discussion with regarding next steps to prepare for the procedure and the planned  anesthesia care took place during today's visit.  I explained that the patient will meet with their anesthesiology providers on the DOS.  Discussed with Patient      Acceptability of blood products: Accepted  Use of blood products discussed with Patient

## 2022-06-28 ENCOUNTER — Other Ambulatory Visit: Payer: Self-pay

## 2022-06-28 ENCOUNTER — Inpatient Hospital Stay: Payer: No Typology Code available for payment source | Admitting: Anesthesiology

## 2022-06-28 ENCOUNTER — Inpatient Hospital Stay
Admission: RE | Admit: 2022-06-28 | Discharge: 2022-08-18 | DRG: 653 | Disposition: A | Discharge status: Skilled Nursing Facility | Payer: No Typology Code available for payment source | Attending: Internal Medicine | Admitting: Internal Medicine

## 2022-06-28 ENCOUNTER — Encounter
Admission: RE | Disposition: A | Discharge status: Skilled Nursing Facility | Payer: Self-pay | Source: Home / Self Care | Attending: Student in an Organized Health Care Education/Training Program

## 2022-06-28 ENCOUNTER — Inpatient Hospital Stay: Payer: No Typology Code available for payment source

## 2022-06-28 DIAGNOSIS — Z9049 Acquired absence of other specified parts of digestive tract: Secondary | ICD-10-CM

## 2022-06-28 DIAGNOSIS — R000 Tachycardia, unspecified: Secondary | ICD-10-CM

## 2022-06-28 DIAGNOSIS — D631 Anemia in chronic kidney disease: Secondary | ICD-10-CM | POA: Diagnosis present

## 2022-06-28 DIAGNOSIS — I5032 Chronic diastolic (congestive) heart failure: Secondary | ICD-10-CM | POA: Diagnosis present

## 2022-06-28 DIAGNOSIS — Z87891 Personal history of nicotine dependence: Secondary | ICD-10-CM

## 2022-06-28 DIAGNOSIS — K9171 Accidental puncture and laceration of a digestive system organ or structure during a digestive system procedure: Secondary | ICD-10-CM | POA: Diagnosis not present

## 2022-06-28 DIAGNOSIS — J1569 Pneumonia due to other gram-negative bacteria: Secondary | ICD-10-CM | POA: Diagnosis not present

## 2022-06-28 DIAGNOSIS — R571 Hypovolemic shock: Secondary | ICD-10-CM | POA: Diagnosis not present

## 2022-06-28 DIAGNOSIS — D6859 Other primary thrombophilia: Secondary | ICD-10-CM | POA: Diagnosis not present

## 2022-06-28 DIAGNOSIS — J449 Chronic obstructive pulmonary disease, unspecified: Secondary | ICD-10-CM

## 2022-06-28 DIAGNOSIS — E1140 Type 2 diabetes mellitus with diabetic neuropathy, unspecified: Secondary | ICD-10-CM | POA: Diagnosis present

## 2022-06-28 DIAGNOSIS — S31109D Unspecified open wound of abdominal wall, unspecified quadrant without penetration into peritoneal cavity, subsequent encounter: Secondary | ICD-10-CM

## 2022-06-28 DIAGNOSIS — Y658 Other specified misadventures during surgical and medical care: Secondary | ICD-10-CM | POA: Diagnosis not present

## 2022-06-28 DIAGNOSIS — N186 End stage renal disease: Secondary | ICD-10-CM | POA: Diagnosis present

## 2022-06-28 DIAGNOSIS — K219 Gastro-esophageal reflux disease without esophagitis: Secondary | ICD-10-CM | POA: Diagnosis present

## 2022-06-28 DIAGNOSIS — E873 Alkalosis: Secondary | ICD-10-CM | POA: Diagnosis not present

## 2022-06-28 DIAGNOSIS — D649 Anemia, unspecified: Secondary | ICD-10-CM

## 2022-06-28 DIAGNOSIS — K660 Peritoneal adhesions (postprocedural) (postinfection): Secondary | ICD-10-CM | POA: Diagnosis present

## 2022-06-28 DIAGNOSIS — Z992 Dependence on renal dialysis: Secondary | ICD-10-CM

## 2022-06-28 DIAGNOSIS — E8809 Other disorders of plasma-protein metabolism, not elsewhere classified: Secondary | ICD-10-CM | POA: Diagnosis not present

## 2022-06-28 DIAGNOSIS — J9601 Acute respiratory failure with hypoxia: Secondary | ICD-10-CM | POA: Diagnosis not present

## 2022-06-28 DIAGNOSIS — C642 Malignant neoplasm of left kidney, except renal pelvis: Secondary | ICD-10-CM | POA: Diagnosis present

## 2022-06-28 DIAGNOSIS — E1122 Type 2 diabetes mellitus with diabetic chronic kidney disease: Secondary | ICD-10-CM | POA: Diagnosis present

## 2022-06-28 DIAGNOSIS — F05 Delirium due to known physiological condition: Secondary | ICD-10-CM | POA: Diagnosis not present

## 2022-06-28 DIAGNOSIS — I611 Nontraumatic intracerebral hemorrhage in hemisphere, cortical: Secondary | ICD-10-CM | POA: Diagnosis not present

## 2022-06-28 DIAGNOSIS — Z7902 Long term (current) use of antithrombotics/antiplatelets: Secondary | ICD-10-CM

## 2022-06-28 DIAGNOSIS — E43 Unspecified severe protein-calorie malnutrition: Secondary | ICD-10-CM | POA: Diagnosis present

## 2022-06-28 DIAGNOSIS — I480 Paroxysmal atrial fibrillation: Secondary | ICD-10-CM | POA: Diagnosis present

## 2022-06-28 DIAGNOSIS — I639 Cerebral infarction, unspecified: Secondary | ICD-10-CM | POA: Diagnosis not present

## 2022-06-28 DIAGNOSIS — Z8673 Personal history of transient ischemic attack (TIA), and cerebral infarction without residual deficits: Secondary | ICD-10-CM

## 2022-06-28 DIAGNOSIS — C679 Malignant neoplasm of bladder, unspecified: Principal | ICD-10-CM | POA: Diagnosis present

## 2022-06-28 DIAGNOSIS — Z515 Encounter for palliative care: Secondary | ICD-10-CM

## 2022-06-28 DIAGNOSIS — I9581 Postprocedural hypotension: Secondary | ICD-10-CM | POA: Diagnosis not present

## 2022-06-28 DIAGNOSIS — Z6834 Body mass index (BMI) 34.0-34.9, adult: Secondary | ICD-10-CM

## 2022-06-28 DIAGNOSIS — Z794 Long term (current) use of insulin: Secondary | ICD-10-CM

## 2022-06-28 DIAGNOSIS — N2581 Secondary hyperparathyroidism of renal origin: Secondary | ICD-10-CM | POA: Diagnosis present

## 2022-06-28 DIAGNOSIS — D696 Thrombocytopenia, unspecified: Secondary | ICD-10-CM | POA: Diagnosis present

## 2022-06-28 DIAGNOSIS — G928 Other toxic encephalopathy: Secondary | ICD-10-CM | POA: Diagnosis not present

## 2022-06-28 DIAGNOSIS — I132 Hypertensive heart and chronic kidney disease with heart failure and with stage 5 chronic kidney disease, or end stage renal disease: Secondary | ICD-10-CM | POA: Diagnosis present

## 2022-06-28 DIAGNOSIS — Z79899 Other long term (current) drug therapy: Secondary | ICD-10-CM

## 2022-06-28 DIAGNOSIS — R578 Other shock: Secondary | ICD-10-CM | POA: Diagnosis not present

## 2022-06-28 DIAGNOSIS — E871 Hypo-osmolality and hyponatremia: Secondary | ICD-10-CM | POA: Diagnosis not present

## 2022-06-28 DIAGNOSIS — N1330 Unspecified hydronephrosis: Secondary | ICD-10-CM | POA: Diagnosis present

## 2022-06-28 DIAGNOSIS — D62 Acute posthemorrhagic anemia: Secondary | ICD-10-CM | POA: Diagnosis not present

## 2022-06-28 DIAGNOSIS — E785 Hyperlipidemia, unspecified: Secondary | ICD-10-CM | POA: Diagnosis present

## 2022-06-28 DIAGNOSIS — E875 Hyperkalemia: Secondary | ICD-10-CM | POA: Diagnosis present

## 2022-06-28 DIAGNOSIS — E669 Obesity, unspecified: Secondary | ICD-10-CM | POA: Diagnosis present

## 2022-06-28 DIAGNOSIS — Z481 Encounter for planned postprocedural wound closure: Secondary | ICD-10-CM

## 2022-06-28 HISTORY — PX: LAPAROTOMY, NEPHRECTOMY: SHX4627

## 2022-06-28 HISTORY — PX: CLOSURE, ENTEROTOMY, SMALL INTESTINE: SHX00093

## 2022-06-28 HISTORY — PX: WOUND VAC APPLICATION: SHX510117

## 2022-06-28 HISTORY — PX: CYSTECTOMY, ILEOCONDUIT: SHX3543

## 2022-06-28 HISTORY — PX: EXPLORATORY LAPAROTOMY, RESECTION SMALL BOWEL: SHX4081

## 2022-06-28 LAB — LAB USE ONLY - CBC WITH DIFFERENTIAL
Absolute Basophils: 0.04 10*3/uL (ref 0.00–0.08)
Absolute Basophils: 0.04 10*3/uL (ref 0.00–0.08)
Absolute Basophils: 0.05 10*3/uL (ref 0.00–0.08)
Absolute Eosinophils: 0.03 10*3/uL (ref 0.00–0.44)
Absolute Eosinophils: 0.02 10*3/uL (ref 0.00–0.44)
Absolute Eosinophils: 0.03 10*3/uL (ref 0.00–0.44)
Absolute Immature Granulocytes: 0.25 10*3/uL — ABNORMAL HIGH (ref 0.00–0.07)
Absolute Immature Granulocytes: 0.33 10*3/uL — ABNORMAL HIGH (ref 0.00–0.07)
Absolute Immature Granulocytes: 0.65 10*3/uL — ABNORMAL HIGH (ref 0.00–0.07)
Absolute Lymphocytes: 3.04 10*3/uL (ref 0.42–3.22)
Absolute Lymphocytes: 2.11 10*3/uL (ref 0.42–3.22)
Absolute Lymphocytes: 2.47 10*3/uL (ref 0.42–3.22)
Absolute Monocytes: 1.34 10*3/uL — ABNORMAL HIGH (ref 0.21–0.85)
Absolute Monocytes: 1.58 10*3/uL — ABNORMAL HIGH (ref 0.21–0.85)
Absolute Monocytes: 2.17 10*3/uL — ABNORMAL HIGH (ref 0.21–0.85)
Absolute Neutrophils: 13.4 10*3/uL — ABNORMAL HIGH (ref 1.10–6.33)
Absolute Neutrophils: 15.17 10*3/uL — ABNORMAL HIGH (ref 1.10–6.33)
Absolute Neutrophils: 21.17 10*3/uL — ABNORMAL HIGH (ref 1.10–6.33)
Absolute nRBC: 0 10*3/uL (ref <=0.00)
Absolute nRBC: 0.02 10*3/uL — ABNORMAL HIGH (ref <=0.00)
Absolute nRBC: 0.03 10*3/uL — ABNORMAL HIGH (ref <=0.00)
Basophils %: 0.2 %
Basophils %: 0.2 %
Basophils %: 0.2 %
Eosinophils %: 0.2 %
Eosinophils %: 0.1 %
Eosinophils %: 0.1 %
Hematocrit: 28.1 % — ABNORMAL LOW (ref 34.7–43.7)
Hematocrit: 30.1 % — ABNORMAL LOW (ref 34.7–43.7)
Hematocrit: 28.6 % — ABNORMAL LOW (ref 34.7–43.7)
Hemoglobin: 9.1 g/dL — ABNORMAL LOW (ref 11.4–14.8)
Hemoglobin: 9.5 g/dL — ABNORMAL LOW (ref 11.4–14.8)
Hemoglobin: 9.3 g/dL — ABNORMAL LOW (ref 11.4–14.8)
Immature Granulocytes %: 1.4 %
Immature Granulocytes %: 1.7 %
Immature Granulocytes %: 2.4 %
Lymphocytes %: 16.8 %
Lymphocytes %: 11 %
Lymphocytes %: 9.3 %
MCH: 30.5 pg (ref 25.1–33.5)
MCH: 30.3 pg (ref 25.1–33.5)
MCH: 30.1 pg (ref 25.1–33.5)
MCHC: 32.4 g/dL (ref 31.5–35.8)
MCHC: 31.6 g/dL (ref 31.5–35.8)
MCHC: 32.5 g/dL (ref 31.5–35.8)
MCV: 94.3 fL (ref 78.0–96.0)
MCV: 95.9 fL (ref 78.0–96.0)
MCV: 92.6 fL (ref 78.0–96.0)
MPV: 10.5 fL (ref 8.9–12.5)
MPV: 10.4 fL (ref 8.9–12.5)
MPV: 10.6 fL (ref 8.9–12.5)
Monocytes %: 7.4 %
Monocytes %: 8.2 %
Monocytes %: 8.2 %
Neutrophils %: 74 %
Neutrophils %: 78.8 %
Neutrophils %: 79.8 %
Platelet Count: 127 10*3/uL — ABNORMAL LOW (ref 142–346)
Platelet Count: 146 10*3/uL (ref 142–346)
Platelet Count: 144 10*3/uL (ref 142–346)
Preliminary Absolute Neutrophil Count: 13.4 10*3/uL — ABNORMAL HIGH (ref 1.10–6.33)
Preliminary Absolute Neutrophil Count: 15.17 10*3/uL — ABNORMAL HIGH (ref 1.10–6.33)
Preliminary Absolute Neutrophil Count: 21.17 10*3/uL — ABNORMAL HIGH (ref 1.10–6.33)
RBC: 2.98 10*6/uL — ABNORMAL LOW (ref 3.90–5.10)
RBC: 3.14 10*6/uL — ABNORMAL LOW (ref 3.90–5.10)
RBC: 3.09 10*6/uL — ABNORMAL LOW (ref 3.90–5.10)
RDW: 15 % (ref 11–15)
RDW: 15 % (ref 11–15)
RDW: 15 % (ref 11–15)
WBC: 18.1 10*3/uL — ABNORMAL HIGH (ref 3.10–9.50)
WBC: 19.25 10*3/uL — ABNORMAL HIGH (ref 3.10–9.50)
WBC: 26.54 10*3/uL — ABNORMAL HIGH (ref 3.10–9.50)
nRBC %: 0 /100 WBC (ref <=0.0)
nRBC %: 0.1 /100 WBC — ABNORMAL HIGH (ref <=0.0)
nRBC %: 0.1 /100 WBC — ABNORMAL HIGH (ref <=0.0)

## 2022-06-28 LAB — ARTERIAL BLOOD GAS
Arterial Base Excess: 2.2 mEq/L (ref <=2.7)
Arterial Base Excess: -8.3 mEq/L — ABNORMAL LOW (ref <=2.7)
Arterial Base Excess: -9.8 mEq/L — ABNORMAL LOW (ref <=2.7)
Arterial CO2: 26.9 mEq/L (ref 23.0–30.0)
Arterial CO2: 17.5 mEq/L — ABNORMAL LOW (ref 23.0–30.0)
Arterial CO2: 14.3 mEq/L — ABNORMAL LOW (ref 23.0–30.0)
Arterial HCO3: 26.7 mEq/L (ref 23.0–28.0)
Arterial HCO3: 18.2 mEq/L — ABNORMAL LOW (ref 23.0–28.0)
Arterial HCO3: 15 mEq/L — ABNORMAL LOW (ref 23.0–28.0)
Arterial O2 Saturation: 98.8 % — ABNORMAL HIGH (ref 94.0–98.0)
Arterial O2 Saturation: 98.9 % — ABNORMAL HIGH (ref 94.0–98.0)
Arterial O2 Saturation: 99.2 % — ABNORMAL HIGH (ref 94.0–98.0)
Arterial pCO2: 44.2 mmHg (ref 32.0–48.0)
Arterial pCO2: 43.7 mmHg (ref 32.0–48.0)
Arterial pCO2: 29.4 mmHg — ABNORMAL LOW (ref 32.0–48.0)
Arterial pH: 7.395 (ref 7.350–7.450)
Arterial pH: 7.242 — ABNORMAL LOW (ref 7.350–7.450)
Arterial pH: 7.324 — ABNORMAL LOW (ref 7.350–7.450)
Arterial pO2: 153 mmHg — ABNORMAL HIGH (ref 83.0–108.0)
Arterial pO2: 221 mmHg — ABNORMAL HIGH (ref 83.0–108.0)
Arterial pO2: 330 mmHg — ABNORMAL HIGH (ref 83.0–108.0)
FIO2: 100 %
FIO2: 100 %
PEEP: 5 cmH2O
PEEP: 5 cmH2O
Set Rate: 115 {beats}/min
Temperature: 36.5 C
Temperature: 37 C
Temperature: 36.2 C
Tidal Volume: 400 mL
Tidal Volume: 400 mL

## 2022-06-28 LAB — PT AND APTT
INR: 1.3 (ref 0.9–1.1)
INR: 1.2 (ref 0.9–1.1)
PT: 15.1 s — ABNORMAL HIGH (ref 10.1–12.9)
PT: 13.9 s — ABNORMAL HIGH (ref 10.1–12.9)
PTT: 25 s — ABNORMAL LOW (ref 27–39)
PTT: 25 s — ABNORMAL LOW (ref 27–39)

## 2022-06-28 LAB — BASIC METABOLIC PANEL
Anion Gap: 14 (ref 5.0–15.0)
Anion Gap: 14 (ref 5.0–15.0)
Anion Gap: 13 (ref 5.0–15.0)
Anion Gap: 16 — ABNORMAL HIGH (ref 5.0–15.0)
Anion Gap: 18 — ABNORMAL HIGH (ref 5.0–15.0)
BUN: 22 mg/dL — ABNORMAL HIGH (ref 7–21)
BUN: 22 mg/dL — ABNORMAL HIGH (ref 7–21)
BUN: 23 mg/dL — ABNORMAL HIGH (ref 7–21)
BUN: 25 mg/dL — ABNORMAL HIGH (ref 7–21)
BUN: 26 mg/dL — ABNORMAL HIGH (ref 7–21)
CO2: 29 mEq/L (ref 17–29)
CO2: 20 mEq/L (ref 17–29)
CO2: 20 mEq/L (ref 17–29)
CO2: 15 mEq/L — ABNORMAL LOW (ref 17–29)
CO2: 15 mEq/L — ABNORMAL LOW (ref 17–29)
Calcium: 8.8 mg/dL (ref 7.9–10.2)
Calcium: 6.6 mg/dL — ABNORMAL LOW (ref 7.9–10.2)
Calcium: 7.8 mg/dL — ABNORMAL LOW (ref 7.9–10.2)
Calcium: 7.4 mg/dL — ABNORMAL LOW (ref 7.9–10.2)
Calcium: 7.9 mg/dL (ref 7.9–10.2)
Chloride: 94 mEq/L — ABNORMAL LOW (ref 99–111)
Chloride: 104 mEq/L (ref 99–111)
Chloride: 105 mEq/L (ref 99–111)
Chloride: 105 mEq/L (ref 99–111)
Chloride: 103 mEq/L (ref 99–111)
Creatinine: 5.3 mg/dL — ABNORMAL HIGH (ref 0.4–1.0)
Creatinine: 5.9 mg/dL — ABNORMAL HIGH (ref 0.4–1.0)
Creatinine: 6.2 mg/dL — ABNORMAL HIGH (ref 0.4–1.0)
Creatinine: 6.4 mg/dL — ABNORMAL HIGH (ref 0.4–1.0)
Creatinine: 6.5 mg/dL — ABNORMAL HIGH (ref 0.4–1.0)
GFR: 7.5 mL/min/{1.73_m2} — ABNORMAL LOW (ref >=60.0)
GFR: 6.6 mL/min/{1.73_m2} — ABNORMAL LOW (ref >=60.0)
GFR: 6.2 mL/min/{1.73_m2} — ABNORMAL LOW (ref >=60.0)
GFR: 6 mL/min/{1.73_m2} — ABNORMAL LOW (ref >=60.0)
GFR: 5.8 mL/min/{1.73_m2} — ABNORMAL LOW (ref >=60.0)
Glucose: 106 mg/dL — ABNORMAL HIGH (ref 70–100)
Glucose: 306 mg/dL — ABNORMAL HIGH (ref 70–100)
Glucose: 274 mg/dL — ABNORMAL HIGH (ref 70–100)
Glucose: 332 mg/dL — ABNORMAL HIGH (ref 70–100)
Glucose: 324 mg/dL — ABNORMAL HIGH (ref 70–100)
Potassium: 3.4 mEq/L — ABNORMAL LOW (ref 3.5–5.3)
Potassium: 3.6 mEq/L (ref 3.5–5.3)
Potassium: 4.5 mEq/L (ref 3.5–5.3)
Potassium: 4.6 mEq/L (ref 3.5–5.3)
Potassium: 4.2 mEq/L (ref 3.5–5.3)
Sodium: 137 mEq/L (ref 135–145)
Sodium: 138 mEq/L (ref 135–145)
Sodium: 138 mEq/L (ref 135–145)
Sodium: 136 mEq/L (ref 135–145)
Sodium: 136 mEq/L (ref 135–145)

## 2022-06-28 LAB — LACTIC ACID
Whole Blood Lactic Acid: 3.6 mmol/L — ABNORMAL HIGH (ref 0.2–2.0)
Whole Blood Lactic Acid: 3.5 mmol/L — ABNORMAL HIGH (ref 0.2–2.0)
Whole Blood Lactic Acid: 2.5 mmol/L — ABNORMAL HIGH (ref 0.2–2.0)

## 2022-06-28 LAB — PHOSPHORUS
Phosphorus: 8.4 mg/dL — ABNORMAL HIGH (ref 2.3–4.7)
Phosphorus: 8.5 mg/dL — ABNORMAL HIGH (ref 2.3–4.7)
Phosphorus: 8.2 mg/dL — ABNORMAL HIGH (ref 2.3–4.7)

## 2022-06-28 LAB — MAGNESIUM
Magnesium: 1.7 mg/dL (ref 1.6–2.6)
Magnesium: 1.7 mg/dL (ref 1.6–2.6)
Magnesium: 1.6 mg/dL (ref 1.6–2.6)

## 2022-06-28 LAB — CBC
Absolute nRBC: 0 10*3/uL (ref <=0.00)
Hematocrit: 13.4 % — ABNORMAL LOW (ref 34.7–43.7)
Hemoglobin: 4.1 g/dL — CL (ref 11.4–14.8)
MCH: 30.4 pg (ref 25.1–33.5)
MCHC: 30.6 g/dL — ABNORMAL LOW (ref 31.5–35.8)
MCV: 99.3 fL — ABNORMAL HIGH (ref 78.0–96.0)
MPV: 9.7 fL (ref 8.9–12.5)
Platelet Count: 162 10*3/uL (ref 142–346)
RBC: 1.35 10*6/uL — ABNORMAL LOW (ref 3.90–5.10)
RDW: 15 % (ref 11–15)
WBC: 17.05 10*3/uL — ABNORMAL HIGH (ref 3.10–9.50)
nRBC %: 0 /100 WBC (ref <=0.0)

## 2022-06-28 LAB — PT/INR
INR: 1.3 (ref 0.9–1.1)
PT: 15.2 s — ABNORMAL HIGH (ref 10.1–12.9)

## 2022-06-28 LAB — WHOLE BLOOD GLUCOSE POCT
Whole Blood Glucose POCT: 90 mg/dL (ref 70–100)
Whole Blood Glucose POCT: 88 mg/dL (ref 70–100)
Whole Blood Glucose POCT: 145 mg/dL — ABNORMAL HIGH (ref 70–100)
Whole Blood Glucose POCT: 241 mg/dL — ABNORMAL HIGH (ref 70–100)
Whole Blood Glucose POCT: 282 mg/dL — ABNORMAL HIGH (ref 70–100)
Whole Blood Glucose POCT: 287 mg/dL — ABNORMAL HIGH (ref 70–100)

## 2022-06-28 LAB — CALCIUM, IONIZED: Calcium, Ionized: 2.23 mEq/L (ref 2.23–2.56)

## 2022-06-28 SURGERY — CYSTECTOMY, CREATION URETEROILEAL CONDUIT
Anesthesia: Anesthesia General | Site: Pelvis | Wound class: Clean

## 2022-06-28 MED ORDER — METOCLOPRAMIDE HCL 5 MG/ML IJ SOLN
10.0000 mg | Freq: Once | INTRAMUSCULAR | Status: DC | PRN
Start: 2022-06-28 — End: 2022-06-28

## 2022-06-28 MED ORDER — DEXTROSE 10 % IV BOLUS
12.5000 g | INTRAVENOUS | Status: DC | PRN
Start: 2022-06-28 — End: 2022-06-29

## 2022-06-28 MED ORDER — ROCURONIUM BROMIDE 10 MG/ML IV SOLN (WRAP)
INTRAVENOUS | Status: DC | PRN
Start: 2022-06-28 — End: 2022-06-28
  Administered 2022-06-28 (×4): 10 mg via INTRAVENOUS
  Administered 2022-06-28: 20 mg via INTRAVENOUS
  Administered 2022-06-28: 50 mg via INTRAVENOUS

## 2022-06-28 MED ORDER — OXYCODONE HCL 5 MG PO TABS
5.0000 mg | ORAL_TABLET | Freq: Once | ORAL | Status: DC | PRN
Start: 2022-06-28 — End: 2022-06-28

## 2022-06-28 MED ORDER — LACTATED RINGERS IV SOLN
INTRAVENOUS | Status: DC | PRN
Start: 2022-06-28 — End: 2022-06-28

## 2022-06-28 MED ORDER — ALBUMIN HUMAN/BIOSIMILIAR 5% IV SOLN (WRAP)
INTRAVENOUS | Status: DC | PRN
Start: 2022-06-28 — End: 2022-06-28

## 2022-06-28 MED ORDER — DEXMEDETOMIDINE HCL 200 MCG/2ML IV SOLN
INTRAVENOUS | Status: AC
Start: 2022-06-28 — End: ?
  Filled 2022-06-28: qty 2

## 2022-06-28 MED ORDER — SODIUM CHLORIDE 0.9 % IV SOLN
INTRAVENOUS | Status: DC | PRN
Start: 2022-06-28 — End: 2022-06-29

## 2022-06-28 MED ORDER — ALBUTEROL SULFATE HFA 108 (90 BASE) MCG/ACT IN AERS
INHALATION_SPRAY | RESPIRATORY_TRACT | Status: AC
Start: 2022-06-28 — End: ?
  Filled 2022-06-28: qty 8

## 2022-06-28 MED ORDER — PHENYLEPHRINE 100 MCG/ML IV SYRINGE FOR INFUSION (ANESTHESIA)
PREFILLED_SYRINGE | INTRAVENOUS | Status: DC | PRN
Start: 2022-06-28 — End: 2022-06-28
  Administered 2022-06-28: 25 ug/min via INTRAVENOUS

## 2022-06-28 MED ORDER — ALBUMIN HUMAN/BIOSIMILIAR 5% IV SOLN (WRAP)
INTRAVENOUS | Status: AC
Start: 2022-06-28 — End: ?
  Filled 2022-06-28: qty 500

## 2022-06-28 MED ORDER — GLUCOSE 40 % PO GEL (WRAP)
15.0000 g | ORAL | Status: DC | PRN
Start: 2022-06-28 — End: 2022-06-29

## 2022-06-28 MED ORDER — PROPOFOL 10 MG/ML IV EMUL (WRAP)
INTRAVENOUS | Status: AC
Start: 2022-06-28 — End: ?
  Filled 2022-06-28: qty 40

## 2022-06-28 MED ORDER — SODIUM CHLORIDE 0.9 % IV SOLN
INTRAVENOUS | Status: DC | PRN
Start: 2022-06-28 — End: 2022-06-28

## 2022-06-28 MED ORDER — PROPOFOL 10 MG/ML IV EMUL (WRAP)
INTRAVENOUS | Status: DC | PRN
Start: 2022-06-28 — End: 2022-06-28
  Administered 2022-06-28: 100 mg via INTRAVENOUS

## 2022-06-28 MED ORDER — PHENYLEPHRINE 100 MCG/ML IV SOSY (WRAP)
PREFILLED_SYRINGE | INTRAVENOUS | Status: AC
Start: 2022-06-28 — End: ?
  Filled 2022-06-28: qty 10

## 2022-06-28 MED ORDER — ONDANSETRON HCL 4 MG/2ML IJ SOLN
4.0000 mg | Freq: Once | INTRAMUSCULAR | Status: DC | PRN
Start: 2022-06-28 — End: 2022-06-28

## 2022-06-28 MED ORDER — INSULIN LISPRO 100 UNIT/ML SOLN (WRAP)
1.0000 [IU] | Status: DC
Start: 2022-06-28 — End: 2022-06-29
  Administered 2022-06-28: 3 [IU] via SUBCUTANEOUS
  Administered 2022-06-28: 2 [IU] via SUBCUTANEOUS
  Administered 2022-06-28: 3 [IU] via SUBCUTANEOUS
  Filled 2022-06-28: qty 9
  Filled 2022-06-28: qty 6
  Filled 2022-06-28: qty 9

## 2022-06-28 MED ORDER — HYDROMORPHONE HCL 1 MG/ML IJ SOLN
INTRAMUSCULAR | Status: DC | PRN
Start: 2022-06-28 — End: 2022-06-28
  Administered 2022-06-28: .2 mg via INTRAVENOUS

## 2022-06-28 MED ORDER — DEXTROSE 50 % IV SOLN
12.5000 g | INTRAVENOUS | Status: DC | PRN
Start: 2022-06-28 — End: 2022-06-29

## 2022-06-28 MED ORDER — HEPARIN SODIUM (PORCINE) 5000 UNIT/ML IJ SOLN
INTRAMUSCULAR | Status: AC
Start: 2022-06-28 — End: ?
  Filled 2022-06-28: qty 1

## 2022-06-28 MED ORDER — ACETAMINOPHEN 325 MG PO TABS
650.0000 mg | ORAL_TABLET | Freq: Once | ORAL | Status: DC | PRN
Start: 2022-06-28 — End: 2022-06-28

## 2022-06-28 MED ORDER — SENNOSIDES-DOCUSATE SODIUM 8.6-50 MG PO TABS
1.0000 | ORAL_TABLET | Freq: Two times a day (BID) | ORAL | Status: DC
Start: 2022-06-28 — End: 2022-06-28

## 2022-06-28 MED ORDER — VASOPRESSIN 20 UNIT/ML IV SOLN (WRAP)
Status: DC | PRN
Start: 2022-06-28 — End: 2022-06-28
  Administered 2022-06-28 (×3): 1 [IU] via INTRAVENOUS

## 2022-06-28 MED ORDER — GLUCAGON 1 MG IJ SOLR (WRAP)
1.0000 mg | INTRAMUSCULAR | Status: DC | PRN
Start: 2022-06-28 — End: 2022-06-29

## 2022-06-28 MED ORDER — SODIUM CHLORIDE 0.9 % IV SOLN
INTRAVENOUS | Status: DC | PRN
Start: 2022-06-28 — End: 2022-06-28
  Administered 2022-06-28: 8 ug via INTRAVENOUS

## 2022-06-28 MED ORDER — PIPERACILLIN-TAZOBACTAM IN DEX 2-0.25 GM/50ML IV SOLN
2.2500 g | Freq: Two times a day (BID) | INTRAVENOUS | Status: DC
Start: 2022-06-28 — End: 2022-06-28
  Filled 2022-06-28: qty 50

## 2022-06-28 MED ORDER — CALCIUM CHLORIDE 10 % IV SOLN
INTRAVENOUS | Status: DC | PRN
Start: 2022-06-28 — End: 2022-06-28
  Administered 2022-06-28 (×4): 250 mg via INTRAVENOUS

## 2022-06-28 MED ORDER — SODIUM CHLORIDE 0.9 % IV MBP
2.0000 g | Freq: Three times a day (TID) | INTRAVENOUS | Status: DC
Start: 2022-06-28 — End: 2022-06-28
  Administered 2022-06-28 (×2): 2 g via INTRAVENOUS
  Filled 2022-06-28 (×2): qty 2

## 2022-06-28 MED ORDER — LACTATED RINGERS IV SOLN
INTRAVENOUS | Status: DC
Start: 2022-06-28 — End: 2022-06-28

## 2022-06-28 MED ORDER — ALBUMIN HUMAN/BIOSIMILIAR 5% IV SOLN (WRAP)
12.5000 g | Freq: Once | INTRAVENOUS | Status: AC
Start: 2022-06-28 — End: 2022-06-28
  Administered 2022-06-28: 12.5 g via INTRAVENOUS
  Filled 2022-06-28: qty 250

## 2022-06-28 MED ORDER — LIDOCAINE HCL (PF) 1 % IJ SOLN
INTRAMUSCULAR | Status: AC
Start: 2022-06-28 — End: ?
  Filled 2022-06-28: qty 5

## 2022-06-28 MED ORDER — ROCURONIUM BROMIDE 50 MG/5ML IV SOLN
INTRAVENOUS | Status: AC
Start: 2022-06-28 — End: ?
  Filled 2022-06-28: qty 5

## 2022-06-28 MED ORDER — ALBUTEROL SULFATE HFA 108 (90 BASE) MCG/ACT IN AERS
INHALATION_SPRAY | RESPIRATORY_TRACT | Status: DC | PRN
Start: 2022-06-28 — End: 2022-06-28
  Administered 2022-06-28: 2 via RESPIRATORY_TRACT

## 2022-06-28 MED ORDER — FENTANYL CITRATE (PF) 50 MCG/ML IJ SOLN (WRAP)
50.0000 ug | INTRAMUSCULAR | Status: DC | PRN
Start: 2022-06-28 — End: 2022-06-28

## 2022-06-28 MED ORDER — HEPARIN SODIUM (PORCINE) 5000 UNIT/ML IJ SOLN
INTRAMUSCULAR | Status: DC | PRN
Start: 2022-06-28 — End: 2022-06-28
  Administered 2022-06-28: 5000 [IU] via SUBCUTANEOUS

## 2022-06-28 MED ORDER — FENTANYL CITRATE (PF) 50 MCG/ML IJ SOLN (WRAP)
50.0000 ug | INTRAMUSCULAR | Status: DC | PRN
Start: 2022-06-28 — End: 2022-07-01

## 2022-06-28 MED ORDER — EPHEDRINE SULFATE 50 MG/ML IJ/IV SOLN (WRAP)
Status: AC
Start: 2022-06-28 — End: ?
  Filled 2022-06-28: qty 1

## 2022-06-28 MED ORDER — CEFOXITIN SODIUM 1 G IV SOLR
INTRAVENOUS | Status: AC
Start: 2022-06-28 — End: ?
  Filled 2022-06-28: qty 2

## 2022-06-28 MED ORDER — ESMOLOL HCL 100 MG/10ML IV SOLN
INTRAVENOUS | Status: AC
Start: 2022-06-28 — End: ?
  Filled 2022-06-28: qty 10

## 2022-06-28 MED ORDER — DEXTROSE 10 % IV SOLN
INTRAVENOUS | Status: DC | PRN
Start: 2022-06-28 — End: 2022-06-28

## 2022-06-28 MED ORDER — GLYCOPYRROLATE 0.2 MG/ML IJ SOLN (WRAP)
INTRAMUSCULAR | Status: AC
Start: 2022-06-28 — End: ?
  Filled 2022-06-28: qty 1

## 2022-06-28 MED ORDER — PROPOFOL INFUSION 10 MG/ML
0.0000 ug/kg/min | INTRAVENOUS | Status: DC
Start: 2022-06-28 — End: 2022-07-01
  Administered 2022-06-28: 30 ug/kg/min via INTRAVENOUS
  Administered 2022-06-29 – 2022-06-30 (×2): 10 ug/kg/min via INTRAVENOUS
  Administered 2022-06-30: 50 mg via INTRAVENOUS
  Administered 2022-06-30: 10 ug/kg/min via INTRAVENOUS
  Administered 2022-06-30: 50 mg via INTRAVENOUS
  Administered 2022-07-01: 10 ug/kg/min via INTRAVENOUS
  Filled 2022-06-28 (×5): qty 100

## 2022-06-28 MED ORDER — SODIUM CHLORIDE 0.9 % IV SOLN
0.3000 ug/kg | Freq: Once | INTRAVENOUS | Status: AC
Start: 2022-06-28 — End: 2022-06-28
  Administered 2022-06-28: 26.12 ug via INTRAVENOUS
  Filled 2022-06-28: qty 2

## 2022-06-28 MED ORDER — ONDANSETRON HCL 4 MG/2ML IJ SOLN
INTRAMUSCULAR | Status: AC
Start: 2022-06-28 — End: ?
  Filled 2022-06-28: qty 4

## 2022-06-28 MED ORDER — SODIUM CHLORIDE (PF) 0.9 % IJ SOLN
INTRAMUSCULAR | Status: AC
Start: 2022-06-28 — End: ?
  Filled 2022-06-28: qty 10

## 2022-06-28 MED ORDER — ESMOLOL HCL 100 MG/10ML IV SOLN
INTRAVENOUS | Status: DC | PRN
Start: 2022-06-28 — End: 2022-06-28
  Administered 2022-06-28: 20 mg via INTRAVENOUS
  Administered 2022-06-28: 30 mg via INTRAVENOUS

## 2022-06-28 MED ORDER — FENTANYL CITRATE (PF) 50 MCG/ML IJ SOLN (WRAP)
INTRAMUSCULAR | Status: DC | PRN
Start: 2022-06-28 — End: 2022-06-28
  Administered 2022-06-28 (×4): 50 ug via INTRAVENOUS

## 2022-06-28 MED ORDER — EPINEPHRINE HCL 0.1 MG/ML IJ/IV SOSY (WRAP)
PREFILLED_SYRINGE | Status: DC | PRN
Start: 2022-06-28 — End: 2022-06-28
  Administered 2022-06-28: .05 mg via INTRAVENOUS

## 2022-06-28 MED ORDER — PHENYLEPHRINE HCL-NACL 100-0.9 MG/250ML-% IV SOLN
0.0000 ug/min | INTRAVENOUS | Status: DC
Start: 2022-06-28 — End: 2022-06-28
  Filled 2022-06-28: qty 250

## 2022-06-28 MED ORDER — KETAMINE HCL 10 MG/ML IJ/IV SOLN (WRAP)
Status: AC
Start: 2022-06-28 — End: ?
  Filled 2022-06-28: qty 5

## 2022-06-28 MED ORDER — HYDROMORPHONE HCL 1 MG/ML IJ SOLN
INTRAMUSCULAR | Status: AC
Start: 2022-06-28 — End: ?
  Filled 2022-06-28: qty 1

## 2022-06-28 MED ORDER — PHENYLEPHRINE HCL-NACL 100-0.9 MG/250ML-% IV SOLN
INTRAVENOUS | Status: AC
Start: 2022-06-28 — End: ?
  Filled 2022-06-28: qty 250

## 2022-06-28 MED ORDER — NOREPINEPHRINE-DEXTROSE 8-5 MG/250ML-% IV SOLN (IHS)
0.0000 ug/min | INTRAVENOUS | Status: DC
Start: 2022-06-28 — End: 2022-07-01
  Administered 2022-06-28: 6 ug/min via INTRAVENOUS
  Administered 2022-06-29 (×2): 12 ug/min via INTRAVENOUS
  Administered 2022-06-30: 4 ug/min via INTRAVENOUS
  Filled 2022-06-28 (×4): qty 250

## 2022-06-28 MED ORDER — FENTANYL CITRATE (PF) 50 MCG/ML IJ SOLN (WRAP)
INTRAMUSCULAR | Status: AC
Start: 2022-06-28 — End: ?
  Filled 2022-06-28: qty 2

## 2022-06-28 MED ORDER — ONDANSETRON HCL 4 MG/2ML IJ SOLN
INTRAMUSCULAR | Status: DC | PRN
Start: 2022-06-28 — End: 2022-06-28
  Administered 2022-06-28: 4 mg via INTRAVENOUS

## 2022-06-28 MED ORDER — PIPERACILLIN-TAZOBACTAM IN DEX 2-0.25 GM/50ML IV SOLN
2.2500 g | Freq: Three times a day (TID) | INTRAVENOUS | Status: DC
Start: 2022-06-28 — End: 2022-06-29
  Administered 2022-06-28 – 2022-06-29 (×3): 2.25 g via INTRAVENOUS
  Filled 2022-06-28 (×3): qty 50

## 2022-06-28 MED ORDER — KETAMINE HCL 50 MG/ML IJ/IV SOLN (WRAP)
Status: DC | PRN
Start: 2022-06-28 — End: 2022-06-28
  Administered 2022-06-28 (×2): 10 mg via INTRAVENOUS

## 2022-06-28 MED ORDER — SODIUM CHLORIDE 0.9 % IV SOLN
INTRAVENOUS | Status: AC
Start: 2022-06-28 — End: ?
  Filled 2022-06-28: qty 50

## 2022-06-28 MED ORDER — FENTANYL CITRATE-NACL 1-0.9 MG/100ML-% IV SOLN
0.0000 ug/h | INTRAVENOUS | Status: DC
Start: 2022-06-28 — End: 2022-07-01
  Administered 2022-06-28 – 2022-06-30 (×3): 50 ug/h via INTRAVENOUS
  Administered 2022-06-30: 75 ug/h via INTRAVENOUS
  Administered 2022-07-01: 100 ug/h via INTRAVENOUS
  Filled 2022-06-28 (×5): qty 100

## 2022-06-28 MED ORDER — PROPOFOL INFUSION 10 MG/ML
INTRAVENOUS | Status: DC | PRN
Start: 2022-06-28 — End: 2022-06-28
  Administered 2022-06-28: 50 ug/kg/min via INTRAVENOUS

## 2022-06-28 MED ORDER — SEVOFLURANE IN SOLN
RESPIRATORY_TRACT | Status: AC
Start: 2022-06-28 — End: ?
  Filled 2022-06-28: qty 250

## 2022-06-28 MED ORDER — LIDOCAINE HCL (PF) 1 % IJ SOLN
INTRAMUSCULAR | Status: DC | PRN
Start: 2022-06-28 — End: 2022-06-28
  Administered 2022-06-28: 50 mg via INTRAVENOUS

## 2022-06-28 MED ORDER — FENTANYL BOLUS FROM BAG
50.0000 ug | INTRAVENOUS | Status: DC | PRN
Start: 2022-06-28 — End: 2022-07-01
  Administered 2022-06-28 – 2022-07-01 (×17): 50 ug via INTRAVENOUS

## 2022-06-28 MED ORDER — PHENYLEPHRINE 100 MCG/ML IV BOLUS (ANESTHESIA)
PREFILLED_SYRINGE | INTRAVENOUS | Status: DC | PRN
Start: 2022-06-28 — End: 2022-06-28
  Administered 2022-06-28: 50 ug via INTRAVENOUS
  Administered 2022-06-28 (×3): 100 ug via INTRAVENOUS
  Administered 2022-06-28: 200 ug via INTRAVENOUS
  Administered 2022-06-28: 300 ug via INTRAVENOUS
  Administered 2022-06-28: 200 ug via INTRAVENOUS

## 2022-06-28 SURGICAL SUPPLY — 174 items
APPLICATOR CHLORAPREP 26 ML 70% ISOPROPYL ALCOHOL 2% CHLORHEXIDINE (Applicator) ×8 IMPLANT
APPLICATOR PRP 70% ISPRP 2% CHG 26ML (Applicator) ×8 IMPLANT
APPLIER INTERNAL CLIP LARGE L13 IN (Clips) IMPLANT
APPLIER INTERNAL CLIP LARGE L13 IN TITANIUM 13 CLIP LIGATE AUTOMATIC (Clips) IMPLANT
APPLIER INTERNAL CLIP MEDIUM L11.5 IN (Clips) IMPLANT
APPLIER INTERNAL CLIP MEDIUM L11.5 IN TITANIUM 30 AUTOMATIC CLIP (Clips) IMPLANT
BANDAGE ADHESIVE L2 YD X W6 IN STRETCH (Dressing) ×4 IMPLANT
BANDAGE ADHESIVE L2 YD X W6 IN STRETCH NONWOVEN POROUS COVER-ROLL (Dressing) ×4 IMPLANT
BARRIER SKIN NEW IMAGE FORMAFLEX FLAT (Ostomy Supply) IMPLANT
BARRIER SKIN NEW IMAGE FORMAFLEX FLAT OD2 1/4 IN 2 PIECE CUT TO FIT (Ostomy Supply) IMPLANT
BLADE 10 CLASSIC CARBON STEEL SURGICAL (Blade) ×8 IMPLANT
BLADE 15 BARD-PARKER RIB-BACK SAFETYLOCK (Blade) ×4 IMPLANT
BLADE 15 BARD-PARKER RIB-BACK SAFETYLOCK CARBON STEEL SURGICAL (Blade) ×4 IMPLANT
BULB DRAINAGE LIGHTWEIGHT LOW LEVEL (Drain) ×4 IMPLANT
BULB DRAINAGE LIGHTWEIGHT LOW LEVEL SUCTION RELIAVAC SILICONE 100 CC (Drain) ×4 IMPLANT
CATH URETHRAL RED RUBBER 16F (Catheter Urine) ×4 IMPLANT
CATHETER IV OD18 GA L1.88 IN RADIOPAQUE (Catheter Miscellaneous) ×4 IMPLANT
CATHETER IV OD18 GA L1.88 IN RADIOPAQUE DEHP FREE ANGIOCATH STANDARD (Catheter Miscellaneous) ×4 IMPLANT
CATHETER URETHRAL OD18 FR 5 CC FOLEY 2 (Catheter Urine) ×4 IMPLANT
CATHETER URETHRAL OD18 FR 5 CC FOLEY 2 WAY BALLOON ATRAUMATIC (Catheter Urine) ×4 IMPLANT
CLIP INTERNAL LARGE CHEVRON HEART LIGATE (Clips) IMPLANT
CLIP INTERNAL LARGE CHEVRON LIGATE TRIANGULATE CROSS SECTION HEART (Clips) IMPLANT
CLIP INTERNAL MEDIUM CHEVRON 6 CARTRIDGE (Clips) IMPLANT
CLIP INTERNAL MEDIUM CHEVRON 6 CARTRIDGE LIGATE TRIANGULATE CROSS (Clips) IMPLANT
CLIP INTERNAL MEDIUM LARGE CHEVRON 6 (Clips) IMPLANT
CLIP INTERNAL SMALL CHEVRON 6 CARTRIDGE (Clips) IMPLANT
CLIP INTERNAL SMALL CHEVRON 6 CARTRIDGE LIGATE TRIANGULATE CROSS (Clips) IMPLANT
COUNTER 40 COUNT BLOCK MAGNET MEDLINE (Needles) ×4 IMPLANT
COUNTER 40 COUNT BLOCK MAGNET SHARPS FOAM (Needles) ×4 IMPLANT
DEVICE CLOSURE L30 CM 2-0 V-20 V-LOC 180 (Suture) ×4 IMPLANT
DISSECTOR LAPAROSCOPIC L.56 IN X W.25 IN (Instrument) ×4 IMPLANT
DISSECTOR LAPAROSCOPIC L.56 IN X W.25 IN C5 HOLDER SPONGE XRAY (Instrument) ×4 IMPLANT
DRAIN OD.25 IN SAFETY PIN RADIOPAQUE (Drain) IMPLANT
DRAIN OD.25 IN SAFETY PIN RADIOPAQUE PENROSE L12 IN INCISION RUBBER (Drain) IMPLANT
DRAIN OD19 FR ODSEC1/4 IN 4 CHANNEL (Drain) ×4 IMPLANT
DRAIN OD19 FR ODSEC1/4 IN 4 CHANNEL RADIOPAQUE HUBLESS TROCAR BLAKE (Drain) ×4 IMPLANT
DRAIN OD19 FR RADIOPAQUE 4 FREE FLOW (Drain) IMPLANT
DRAIN OD19 FR RADIOPAQUE 4 FREE FLOW CHANNEL TROCAR CHANNEL DRAIN L1/4 (Drain) IMPLANT
DRAPE 44X44IN POLYURETHANE FLUID WARMER TEMPERATURE CONTROL (Drape) ×4 IMPLANT
DRAPE EQP PU ORS 44X44IN LF STRL FLD (Drape) ×4 IMPLANT
DRAPE SRG TBRN CNVRT 122X106X77IN LF (Drape) ×8 IMPLANT
DRAPE SURGICAL IMPERVIOUS REINFORCEMENT FENESTRATE ABSORBENT ARMBOARD (Drape) ×8 IMPLANT
DRESSING FM SIL OPTFM 9X9IN LF STRL ADH (Dressing) ×8 IMPLANT
DRESSING TRANSPARENT L2 3/4 IN X W2 3/8 (Dressing) ×4 IMPLANT
DRESSING TRANSPARENT L2 3/4 IN X W2 3/8 IN POLYURETHANE ADHESIVE (Dressing) ×4 IMPLANT
ELECTRODE ADULT PATIENT RETURN L9 FT REM POLYHESIVE ACRYLIC FOAM (Procedure Accessories) ×8 IMPLANT
ELECTRODE ELECTROSRGCL NDL 2.8IN 3/32IN VALLEYLAB SS 1.1IN STD SHFT (Cautery) ×4 IMPLANT
ELECTRODE ELECTROSURGICAL BLADE L6.5 IN (Cautery) ×8 IMPLANT
ELECTRODE ELECTROSURGICAL BLADE L6.5 IN EDGE (Cautery) ×4 IMPLANT
ELECTRODE ELECTROSURGICAL BLADE L6.5 IN OD3/32 IN VALLEYLAB STAINLESS (Cautery) ×4 IMPLANT
ELECTRODE ELECTROSURGICAL NEEDLE L2.8 IN (Cautery) ×4 IMPLANT
ELECTRODE PATIENT RETURN L9 FT VALLEYLAB (Procedure Accessories) ×8 IMPLANT
GLOVE SURGICAL 8 BIOGEL PI POWDER FREE (Glove) ×8 IMPLANT
GLOVE SURGICAL 8 BIOGEL PI POWDER FREE MICRO ROUGHENED BEAD CUFF (Glove) ×8 IMPLANT
GLOVE SURGICAL 8 BIOGEL PI ULTRATOUCH G (Glove) ×8 IMPLANT
GLOVE SURGICAL 8 BIOGEL PI ULTRATOUCH G POWDER FREE ROUGH BEAD CUFF (Glove) ×8 IMPLANT
HEMOSTAT ABSORBABLE POWDER SURGICEL (Procedure Accessories) ×4 IMPLANT
HEMOSTAT ABSORBABLE POWDER SURGICEL OXIDIZED REGENERATED CELLULOSE (Procedure Accessories) ×4 IMPLANT
KIT RM TURNOVER LF NS DISP (Kits) ×8 IMPLANT
KIT ROOM TURNOVER NONSTERILE LATEX FREE DISPOSABLE (Kits) ×8 IMPLANT
LOOP VSL SIL MAXI ELIP STERION 406X1MM (Procedure Accessories) IMPLANT
LOOP VSL SIL MN ELIP STERION 406X.8MM LF (Procedure Accessories) IMPLANT
NEEDLE SPINAL BD OD25 GA L3 1/2 IN (Needles) ×4 IMPLANT
NEEDLE SPINAL BD OD25 GA L3 1/2 IN REGULAR WALL TIP POLYPROPYLENE BLUE (Needles) ×4 IMPLANT
PAD ABDOMINAL L9 IN X W5 IN ABSORBENT (Procedure Accessories) ×8 IMPLANT
PAD ABDOMINAL L9 IN X W5 IN ABSORBENT NONWOVEN HYDROPHOBIC BACK SEAL (Procedure Accessories) ×8 IMPLANT
PAD TRENDELENBURG LARGE ARM PROTECTOR (Positioning Supplies) ×8 IMPLANT
PAD TRENDELENBURG LARGE ARM PROTECTOR POSITIONING 90912 TRENDELENBURG (Positioning Supplies) ×8 IMPLANT
PENCIL SMOKE EVACUATOR COATED PUSH (Cautery) ×8 IMPLANT
PENCIL SMOKE EVACUATOR COATED PUSH BUTTON NEPTUNE E-SEP (Cautery) ×8 IMPLANT
POUCH UROSTOMY OD2 1/4 IN NEW IMAGE (Ostomy Supply) IMPLANT
POUCH UROSTOMY OD2 1/4 IN NEW IMAGE COMFORTWEAR RED L9 IN TRANSPARENT (Ostomy Supply) IMPLANT
RELOAD STAPLER 2 MM 2.5 MM 3 MM L45 MM (Staplers) ×4 IMPLANT
RELOAD STAPLER 2 MM 2.5 MM 3 MM L45 MM ENDO GIA TITANIUM MEDIUM (Staplers) ×4 IMPLANT
RELOAD STAPLER 3 MM 3.5 MM 4 MM L45 MM (Staplers) ×4 IMPLANT
RELOAD STAPLER 3 MM 3.5 MM 4 MM L45 MM ENDO GIA TITANIUM MEDIUM THICK (Staplers) ×4 IMPLANT
RELOAD STAPLER L60 MM X H3.8 MM GIA (Staplers) ×4 IMPLANT
RELOAD STAPLER L60 MM X H3.8 MM GIA TITANIUM REGULAR TISSUE 4 ROW (Staplers) ×4 IMPLANT
RELOAD STAPLER L80 MM X H3.8 MM GIA (Staplers) ×4 IMPLANT
RELOAD STAPLER L80 MM X H3.8 MM GIA TITANIUM REGULAR TISSUE 4 ROW (Staplers) ×4 IMPLANT
SEALER/DIVIDER ELECTROSURGICAL L18 CM 14 D 180 D L34 MM CURVE JAW OVAL (Instrument) ×8 IMPLANT
SEALER/DIVIDER ESURG 14D 180D 34MM LGSR (Instrument) ×8 IMPLANT
SOLUTION IRRIGATION 0.9% SDM CHLORIDE 500ML PR BTTL ISOTONIC NONPRGNC (Irrigation Solutions) ×8 IMPLANT
SOLUTION IRRIGATION 0.9% SODIUM CHLORIDE (Irrigation Solutions) ×8 IMPLANT
SPONGE LAPAROTOMY L18 IN X W18 IN (Sponge) ×12 IMPLANT
SPONGE LAPAROTOMY L18 IN X W18 IN PREWASH WHITE (Sponge) ×12 IMPLANT
SPONGE SURGICAL L4 IN X W4 IN 16 PLY (Sponge) ×4 IMPLANT
SPONGE SURGICAL L4 IN X W4 IN 16 PLY X-RAY DETECT COTTON (Sponge) ×4 IMPLANT
STAPLER REGULAR TISSUE L60 MM X H3.8 MM (Staplers) ×8 IMPLANT
STAPLER REGULAR TISSUE L60 MM X H3.8 MM 4 ROW LINEAR CUTTER QUICK (Staplers) ×8 IMPLANT
STAPLER REGULAR TISSUE L80 MM X H3.8 MM (Staplers) ×8 IMPLANT
STAPLER REGULAR TISSUE L80 MM X H3.8 MM 4 ROW LINEAR CUTTER QUICK (Staplers) ×8 IMPLANT
STAPLER SKIN 35 COUNT REGULAR STAPLE (Staplers) ×4 IMPLANT
STAPLER SKIN 35 COUNT REGULAR STAPLE CARTRIDGE ERGONOMIC HANDLE (Staplers) ×4 IMPLANT
STAPLER SKIN W4.8 MM X H3.4 MM 35 WIDE (Staplers) IMPLANT
STAPLER THIN VASCULAR TISSUE L6 CM X W4 (Staplers) ×4 IMPLANT
STAPLER THIN VASCULAR TISSUE L6 CM X W4 MM OD12 MM ENDOSCOPIC (Staplers) ×4 IMPLANT
STAPLER TISSUE L16 CM X W4 MM OD12 MM (Staplers) IMPLANT
STAPLER TISSUE L16 CM X W4 MM OD12 MM ENDOSCOPIC ENDO GIA PVC INTERNAL (Staplers) IMPLANT
STOCKING CMPR NYL MED REG THG LGTH TED (Patient Supply) ×4 IMPLANT
STOCKING COMPRESSION MEDIUM REGULAR THIGH LENGTH 2 PLY HEEL POCKET TED (Patient Supply) ×4 IMPLANT
SUTURE CHROMIC GUT CHROMIC 3-0 SH L27 IN (Suture) ×8 IMPLANT
SUTURE CHROMIC GUT CHROMIC 3-0 SH L27 IN MONOFILAMENT BROWN ABSORBABLE (Suture) ×8 IMPLANT
SUTURE CHROMIC GUT CHROMIC 4-0 RB-1 L27 (Suture) ×16 IMPLANT
SUTURE CHROMIC GUT CHROMIC 4-0 RB-1 L27 IN MONOFILAMENT BROWN (Suture) ×16 IMPLANT
SUTURE COATED VICRYL 0 CT-1 L36 IN BRAID (Suture) ×12 IMPLANT
SUTURE COATED VICRYL 0 CT-1 L36 IN BRAID COATED VIOLET ABSORBABLE (Suture) ×12 IMPLANT
SUTURE COATED VICRYL 0 L54 IN BRAID REEL (Suture) ×4 IMPLANT
SUTURE COATED VICRYL 2-0 L54 IN BRAID (Suture) ×4 IMPLANT
SUTURE COATED VICRYL 2-0 L54 IN BRAID REEL COATED VIOLET ABSORBABLE (Suture) ×4 IMPLANT
SUTURE COATED VICRYL 2-0 SH L27 IN BRAID (Suture) ×16 IMPLANT
SUTURE COATED VICRYL 2-0 SH L27 IN BRAID COATED UNDYED ABSORBABLE (Suture) IMPLANT
SUTURE COATED VICRYL 2-0 SH L27 IN BRAID COATED VIOLET ABSORBABLE (Suture) ×16 IMPLANT
SUTURE COATED VICRYL 3-0 SH L27 IN BRAID (Suture) ×16 IMPLANT
SUTURE COATED VICRYL 3-0 SH L27 IN BRAID COATED VIOLET ABSORBABLE (Suture) ×16 IMPLANT
SUTURE COATED VICRYL 4-0 L18 IN BRAID (Suture) ×4 IMPLANT
SUTURE COATED VICRYL PLUS 0 L18 IN BRAID (Suture) ×4 IMPLANT
SUTURE COATED VICRYL PLUS 0 L18 IN BRAID 6 STRAND ANTIBACTERIAL COATED (Suture) ×4 IMPLANT
SUTURE COATED VICRYL PLUS 0 UR-6 L27 IN (Suture) IMPLANT
SUTURE COATED VICRYL PLUS 0 UR-6 L27 IN BRAID ANTIBACTERIAL COATED (Suture) IMPLANT
SUTURE COATED VICRYL PLUS 2-0 L18 IN (Suture) ×8 IMPLANT
SUTURE COATED VICRYL PLUS 2-0 L18 IN BRAID 12 STRAND COATED VIOLET (Suture) ×8 IMPLANT
SUTURE COATED VICRYL PLUS 3-0 L18 IN (Suture) ×8 IMPLANT
SUTURE COATED VICRYL PLUS 3-0 L18 IN BRAID 12 STRAND ANTIBACTERIAL (Suture) ×8 IMPLANT
SUTURE ETHILON BLACK 3-0 PS-2 L18 IN (Suture) ×4 IMPLANT
SUTURE ETHILON BLACK 3-0 PS-2 L18 IN MONOFILAMENT NONABSORBABLE (Suture) ×4 IMPLANT
SUTURE ETHILON BLACK 3-0 PSL L30 IN (Suture) IMPLANT
SUTURE ETHILON BLACK 3-0 PSL L30 IN MONOFILAMENT NONABSORBABLE (Suture) IMPLANT
SUTURE MONOCRYL 4-0 PS-2 L18 IN (Suture) ×8 IMPLANT
SUTURE MONOCRYL 4-0 PS-2 L18 IN MONOFILAMENT UNDYED ABSORBABLE (Suture) ×8 IMPLANT
SUTURE PDS II 1 CT-1 L27 IN MONOFILAMENT (Suture) ×4 IMPLANT
SUTURE PDS II 1 CT-1 L27 IN MONOFILAMENT VIOLET ABSORBABLE (Suture) ×4 IMPLANT
SUTURE PDS II 1 CTX L36 IN MONOFILAMENT (Suture) IMPLANT
SUTURE PDS II 1 CTX L36 IN MONOFILAMENT VIOLET ABSORBABLE (Suture) IMPLANT
SUTURE PDS II 1 TP-1 L96 IN MONOFILAMENT (Suture) ×8 IMPLANT
SUTURE PDS II 1 TP-1 L96 IN MONOFILAMENT LOOP VIOLET ABSORBABLE (Suture) ×8 IMPLANT
SUTURE PDS II 5-0 C-1 L30 IN 2 ARM (Suture) ×8 IMPLANT
SUTURE PDS II 5-0 C-1 L30 IN 2 ARM MONOFILAMENT VIOLET ABSORBABLE (Suture) ×8 IMPLANT
SUTURE POLYPROPYLENE PROLENE BLUE 4-0 (Suture) ×8 IMPLANT
SUTURE POLYPROPYLENE PROLENE BLUE 4-0 RB-1 1/2 CIRLE L36 IN 2 ARM (Suture) ×8 IMPLANT
SUTURE PROLENE BLUE 5-0 RB-1 L36 IN 2 (Suture) IMPLANT
SUTURE PROLENE BLUE 5-0 RB-1 L36 IN 2 ARM MONOFILAMENT NONABSORBABLE (Suture) IMPLANT
SUTURE SILK PERMA HAND BLACK 0 L24 IN (Suture) ×4 IMPLANT
SUTURE SILK PERMA HAND BLACK 0 L24 IN BRAID TIE NONABSORBABLE (Suture) ×4 IMPLANT
SUTURE SILK PERMA HAND BLACK 2-0 FS L18 (Suture) ×4 IMPLANT
SUTURE SILK PERMA HAND BLACK 2-0 FS L18 IN BRAID NONABSORBABLE (Suture) ×4 IMPLANT
SUTURE SILK PERMA HAND BLACK 2-0 L24 IN (Suture) ×4 IMPLANT
SUTURE SILK PERMA HAND BLACK 2-0 L24 IN BRAID 2 STRAND PRECUT (Suture) ×4 IMPLANT
SUTURE SILK PERMA HAND BLACK 2-0 SH L30 (Suture) ×28 IMPLANT
SUTURE SILK PERMA HAND BLACK 2-0 SH L30 IN BRAID NONABSORBABLE (Suture) ×28 IMPLANT
SUTURE SILK PERMA HAND BLACK 3-0 SH L18 (Suture) ×4 IMPLANT
SUTURE SILK PERMA HAND BLACK 3-0 SH L18 IN CONTROL RELEASE BRAID 8 (Suture) ×4 IMPLANT
SUTURE SILK PERMA HAND BLACK 4-0 SH L30 (Suture) ×8 IMPLANT
SUTURE SILK PERMA HAND BLACK 4-0 SH L30 IN BRAID NONABSORBABLE (Suture) ×8 IMPLANT
SUTURE V-LOC 180 2-0 V-20 1/2 CIRCLE L12 IN ABS GREEN (Suture) ×4 IMPLANT
SYRINGE 20 ML LUER LOCK MEDLINE MEDICAL (Syringes, Needles) ×4 IMPLANT
SYRINGE MEDLINE 60 ML LID SOFT BULB TIP (Syringes, Needles) ×4 IMPLANT
SYRINGE MEDLINE 60 ML LID SOFT BULB TIP IRRIGATION TYVEK (Syringes, Needles) ×4 IMPLANT
TIP SCT TPR BLBS MDVC YNKR LF STRL DISP (Procedure Accessories) ×4 IMPLANT
TIP SUCTION MEDI-VAC YANKAUER TAPER BULBOUS CLEAR (Procedure Accessories) ×4 IMPLANT
TOWEL L27 IN X W17 IN COTTON STANDARD (Procedure Accessories) ×8 IMPLANT
TOWEL OR L27 IN X W17 IN STANDARD PREWASH DELINT ABSORBENT COTTON BLUE (Procedure Accessories) ×8 IMPLANT
TRAY CATHETER 1 LAYER FOLEY URINE METER CLOSED SYSTEM PVP SILICONE (Tray) ×4 IMPLANT
TRAY CATHETER MEDLINE 1 LAYER FOLEY (Tray) ×4 IMPLANT
TRAY SKIN SCRUB MEDLINE PVP VINYL COTTON (Prep) ×4 IMPLANT
TRAY SRG MINOR LITHOTOMY IFMC (Pack) IMPLANT
TRAY SURGICAL LAPAROTOMY (Pack) ×8 IMPLANT
TRAY SURGICAL MINOR LITHOTOMY (Pack) IMPLANT
TUBE NG PVC CRTY KNGR 5FR 15IN STRL PED (Tubes) ×4 IMPLANT
TUBE OD5 FR CURITY KANGAROO NASOGASTRIC PVC PEDIATRIC L15 IN (Tubes) ×4 IMPLANT
TUBING SUCTION ID9/32 IN L12 FT (Suction) ×12 IMPLANT
TUBING SUCTION ID9/32 IN L12 FT NONCONDUCTIVE MALE TO MALE CONNECTOR (Suction) ×12 IMPLANT
WASHCLOTH CLEANSING L8 IN X W8 IN PH (Procedure Accessories) ×8 IMPLANT
WASHCLOTH CLEANSING L8 IN X W8 IN PH BALANCE NEEDLE PUNCH (Procedure Accessories) ×8 IMPLANT

## 2022-06-28 NOTE — Anesthesia Preprocedure Evaluation (Signed)
Anesthesia Evaluation    AIRWAY    Mallampati: II    TM distance: >3 FB  Neck ROM: limited  Mouth Opening:full   CARDIOVASCULAR    cardiovascular exam normal       DENTAL                   PULMONARY    clear to auscultation     OTHER FINDINGS    Denies loose teeth                                    Relevant Problems   No relevant active problems               Anesthesia Plan    ASA 3     general               (HTN  HL  CVA 2004 with residual R sided weakness  Asthma - recent symptoms, denies wheezing today  GERD - denies current symptoms  COPD - SaO2 92-94% on RA at home  DM  Renal CA s/p partial nephrectomy  Former smoker  [name] TTS - last HD 6/8  Hx infected PD catheter    ECHO 06/25/2022: EF 65%, mild AS  Recent chest pain requiring nitroglycerine use - patient believes to be anxiety related  Prior to PD catheter infection, could climb up 2 flights of stairs in 04/2022    Denies hx of anesthesia complications)      intravenous induction   Detailed anesthesia plan: general endotracheal and PNB  Monitors/Adjuncts: arterial line and CVP    Post Op: post-op ventilation and ICU plan for postoperative opioid use  Trial extubation is planned.    Post op pain management: per surgeon, PO analgesics and PNB single shot    informed consent obtained    Plan discussed with CRNA.      pertinent labs reviewed               Signed by: Joaquim Lai, MD 06/28/22 9:44 AM

## 2022-06-28 NOTE — Transfer of Care (Signed)
Anesthesia Transfer of Care Note    Patient: Shelby Barron    Procedures performed: Procedure(s):  CYSTECTOMY, RADICAL, TOTAL, W/URINARY DIVERSION AND CREATION OF ILLEAL LOOP CONDUIT  SIMPLE NEPHRECTOMY AND LYSIS OF ASHESION  RESECTION SMALL BOWEL  ABTHERA WOUND VAC APPLICATION    Anesthesia type: General ETT    Patient location:ICU    Last vitals:   Vitals:    06/28/22 1600   BP: 105/72   Pulse: 82   Resp: 14   Temp: 37 C (98.6 F)   SpO2: 99%       Post pain: Patient not complaining of pain, continue current therapy      Mental Status:sedated    Respiratory Function: intubated    Cardiovascular: stable    Nausea/Vomiting: patient not complaining of nausea or vomiting    Hydration Status: adequate    Post assessment: no apparent anesthetic complications, no reportable events, and no evidence of recall    Signed by: Waylan Boga, CRNA  06/28/22 4:13 PM

## 2022-06-28 NOTE — Op Note (Signed)
Date of Operation: 06/28/2022   Patient Name: Shelby Barron     Date of Birth:  1940-02-10     MRN:               [id number]      PREOPERATIVE DIAGNOSIS:   ESRD and bladder cancer, need for bilateral nephrectomies concomitant with cystectomy to avoid diversion    POSTOPERATIVE DIAGNOSIS:   Same    SURGEON:   Nonda Lou, MD     ASSISTANT:   Everardo All, MD  Circulator: Colleen Can., RN  Relief Circulator: Augustin Coupe, RN  Relief Scrub: Louann Liv, RN  Scrub Person: Richardine Service  First Assistant: Lynnda Shields  Preceptor: Finis Bud, RN; Darcus Pester, RN    ANESTHESIA:  General    OPERATION:   1. Bilateral open radical nephrectomies  2. Radical cystectomy - will be separately dictated by Dr. Celine Mans    FINDINGS:   1. Significant scarification and fat necrosis surrounding area of prior left partial nephrectomy    COMPLICATIONS:   none    DRAINS:   none    SPECIMEN:   ID Type Source Tests Collected by Time Destination   1 : small bowel Tissue Small Intestine (Specify if applicable) SURGICAL PATHOLOGY Neldon Newport, MD 06/28/2022 1007    2 : Right kidney Tissue Kidney, Right SURGICAL PATHOLOGY Neldon Newport, MD 06/28/2022 1121    3 : LEFT KIDNEY Tissue Kidney, Left SURGICAL PATHOLOGY Neldon Newport, MD 06/28/2022 1222    4 : BLADDER Tissue Urinary bladder SURGICAL PATHOLOGY Neldon Newport, MD 06/28/2022 1400        BLOOD LOSS:   Total blood loss for both portions of the case was 1 liter      BLOOD PRODUCTS ADMINISTERED:   None    PROCEDURE TIMES:  @ORCASETRK2 @    HISTORY OF PRESENT ILLNESS:   Shelby Barron is a 82 y.o. female with a history of ESRD, and prior partial nephrectomy for left renal tumor. She developed bladder carcinoma, and required cystectomy. Bilateral nephrectomies of her nonfunctional kidneys were planned to avoid the morbidity of urinary diversion. The risks and benefits were discussed and the patient agrees with proceeding to surgery.     PROCEDURE IN DETAIL:  The patient  was identified in the preoperative area and the risks and benefits were discussed. The patient was then brought back to the operative theater and placed supine on the operating room table. Anesthesia was administered along with pre-operative antibiotics. A surgical time out was conducted in the presence of nursing, the surgeon and anesthesia staff, and all agreed to proceed with the procedure as stated.    The patient was positioned in supine position, and the case was begun. She was sterile prepped and draped in our standard fashion.    Patient was opened at the sight of her prior lower midline incision, and there was an enterotomy noted at the site of a bowel loop densely adherent to the mesh. Dr. Ulyess Mort was called in, and see his dictation of the adhesiolysis, and resection of the segment with the small enterotomy.     I began the nephrectomies. Right colon was taken off the abdominal wall at the white line. Terminal ileum was adherent to the retropertoneum, and this was dissected off.     Ureter was identified in the retroperitoneum, which was significantly hydronephrotic.     Posterior plane was dissected off of the psoas, and the gonadal vein was dissected  free of surrounding tissues, and traced back to the IVC. This was doubly clipped, and transected in between. The medial plane between the IVC and the kidney was then dissected out, and the vein and artery located. Buchwalter retractor was placed to increase exposure.     Artery was clipped with metal clips and doubly ligated with 2-0 silk free tie. Vein was ligated using a 2-0 silk suture ligature. Both were then transected on the go side. Adrenal was spared with the ligasure.  Upper pole and medial attachments of the kdiney to the IVC and liver were then bluntly lysed, and hemostasis obtained. The ureter was doubly ligated with free tie, and trasnsected in between. The kidney was removed.     I turned my attention to the left kidney. Left colon was then  medialized by opening the white line. The colon and omentum were significantly scarified to the plane anterior to the kidney, with significant fat necrosis, owing to prior partial nephrectomy. Posterior plane along the psoas was developed with ligasure. Lateral plane was likewise developed.  Medial plane along the renal capsule was then carefully developed, sparing the adrenal. At this point, the kidney was attached only by the ureter, gonadal vein, and thick tissue of the hilum.      The ureter was doubly ligated and transected in between, and tagged for the later cystectomy.     The gonadal was likewise doubly clipped and transected in between.  Using digital pressure, the thickened tissue of the hilum was completely defined, but was too thick and difficult to delineate the vein and artery separately. Large vascular clamp was then used to crush the tissue, and long handled knife used to cut the hilum on the go side. The stay side was then oversewn with running suture of 0 silk, sewn back and forth, then the clamp removed, and suture tied to itself. The hemostasis was excellent. Kidney was then sent off the field.     Dr. Celine Mans then performed the cystectomy with my assistance, which he will dictate.     Wound was left open and vacced, and dr. Ulyess Mort plans to take her back to put her bowel back into circuit and close her wound at a later date. See his dictation.         DISPOSITION/PLAN:   ICU, intubated.  Plan to take back for closure once resuscitated.     Nonda Lou, MD  06/28/2022, 3:33 PM

## 2022-06-28 NOTE — Procedures (Signed)
Shelby Barron is a 82 y.o. female patient.  Principal Problem:    Bladder cancer    Past Medical History:   Diagnosis Date    Acid reflux     Asthma, well controlled     Cancer of kidney     Chronic lower back pain     Congestive heart disease     COPD (chronic obstructive pulmonary disease)     CVA (cerebral infarction) 01/12/2003    Diabetes     Diabetic nephropathy     Fibroids     Gout     H/O: gout     Hyperlipidemia     Hypertension     Neuropathy of hand     SOB (shortness of breath)      Blood pressure 119/54, pulse 82, temperature 98.9 F (37.2 C), temperature source Axillary, resp. rate 14, weight 87.1 kg (192 lb), SpO2 99%.    Central Line    Date/Time: 06/28/2022 5:02 PM    Performed by: Velia Meyer, MD  Authorized by: Velia Meyer, MD  Consent: The procedure was performed in an emergent situation.  Required items: required blood products, implants, devices, and special equipment available  Patient identity confirmed: arm band  Time out: Immediately prior to procedure a "time out" was called to verify the correct patient, procedure, equipment, support staff and site/side marked as required.  Indications: vascular access    Sedation:  Patient sedated: yes  Sedatives: propofol  Vitals: Vital signs were monitored during sedation.    Preparation: skin prepped with 2% chlorhexidine  Skin prep agent dried: skin prep agent completely dried prior to procedure  Sterile barriers: all five maximum sterile barriers used - cap, mask, sterile gown, sterile gloves, and large sterile sheet  Hand hygiene: hand hygiene performed prior to central venous catheter insertion  Location details: right femoral  Patient position: flat  Catheter type: triple lumen  Catheter size: 7 Fr  Pre-procedure: landmarks identified  Ultrasound guidance: yes  Sterile ultrasound techniques: sterile gel and sterile probe covers were used  Number of attempts: 1  Successful placement: yes  Post-procedure: line sutured and dressing  applied  Patient tolerance: patient tolerated the procedure well with no immediate complications  Comments: Arrived to the ICU hypotensive with MAP in the high 40s-50s. Central access urgently placed for volume resuscitation and vasopressors.          Velia Meyer, MD  06/28/2022

## 2022-06-28 NOTE — Op Note (Signed)
FULL OPERATIVE NOTE    Date Time: 06/28/22 3:42 PM  Patient Name: Shelby Barron, Shelby Barron (MRN: [id number])  Attending Physician: Neldon Newport, MD      Date of Operation:   06/28/2022    Providers Performing:   Surgeons and Role:     * Neldon Newport, MD - Primary     * Adele Dan, MD - Resident - Assisting     * Marlaine Hind, MD - Assistant Surgeon     * Nonda Lou, MD    Surgical First Assistant(s):   First Assistant: Lynnda Shields    Operative Procedure:   Radical cystectomy, lysis of adhesions    Preoperative Diagnosis:   Pre-Op Diagnosis Codes:     * Malignant neoplasm of urinary bladder, 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  She is given 5000 units of subcu heparin.  She is prepped and draped in standard sterile fashion of after being placed in low lithotomy position.  Foley catheter was inserted sterilely in the field.  The previous midline incision is open.  The fascia is visualized and the mesh is identified.  In the space below the mesh in the lower abdomen the peritoneum was entered.  Immediately upon entry there is a adhesive bit of small bowel to the posterior surface of the peritoneum and contiguous with the mesh.  An enterotomy was performed.  The rest of the loops of small bowel are mobilized and a general surgical consult was obtained.  Please see Dr. Myrla Halsted note.  The pelvis is explored and an extensive lysis of adhesions was performed.  The sigmoid colon was mobilized.  There are multiple loops of small bowel  that are mobilized from the pelvis.  The space of Retzius is developed.  The right and left ureter identified after reflecting the colon on either side.  On the right side there is significant hydronephrosis.  The ureter is mobilized to the pelvis.  This is done on the left side as well where there is a normal ureter.  The space of Retzius is developed.  The medial umbilical ligaments are divided.  The bladder pedicle on the right and left side is isolated.  The pouch of Riley Lam is visualized.  Attention is then turned to the bilateral nephrectomies performed by Dr. Graciela Husbands.  Please see his dictation.  Once this is complete, attention was turned once again to the pelvis.  The LigaSure was used to control the bladder pedicles that were previously isolated.  Anteriorly and the bladder neck was identified and controlled.  Posteriorly the apex of the vagina is incised and the ligature was used to take a segment of anterior vaginal mucosa.  The bladder neck is controlled with a figure-of-eight suture and then the urethra was divided quite distally.  The dorsal vein is controlled with a suture ligature.  The posterior flap of vagina is developed off the rectum.  This is used to close the pelvic floor defect with a running  V-Loc suture.  There is some bleeding from the right pelvic sidewall that is venous.  This is controlled with a clamp and oversewn.  There is no active bleeding in the pelvis at this point.  At this junction the patient is on pressors and has received multiple units of blood.  Her general surgery is direction they have left the abdomen open for return tomorrow to complete the anastomosis and repeat inspection of the abdomen.  The vaginal incision is checked and there is no oozing or bleeding at this point.  No packing is left in place.              Estimated Blood Loss:   1000 mL    Implants:   None       Drains:   Drains: None      Specimens:     ID Type Source Tests Collected by Time Destination   1 :  small bowel Tissue Small Intestine (Specify if applicable) SURGICAL PATHOLOGY Neldon Newport, MD 06/28/2022 1007    2 : Right kidney Tissue Kidney, Right SURGICAL PATHOLOGY Neldon Newport, MD 06/28/2022 1121    3 : LEFT KIDNEY Tissue Kidney, Left SURGICAL PATHOLOGY Neldon Newport, MD 06/28/2022 1222    4 : BLADDER Tissue Urinary bladder SURGICAL PATHOLOGY Neldon Newport, MD 06/28/2022 1400          Complications:   Unplanned enterotomy due to significant abdominal adhesions    Signed by: Neldon Newport, MD  [facility]

## 2022-06-28 NOTE — H&P (Signed)
[facility] BRIEF H&P      Date of Note: 06/28/2022   Patient Name: Shelby Barron     Date of Birth:  1941/01/15     MRN:               [id number]     PCP:               Patsy Lager, MD     Admitting Diagnosis:  bladder ca and renal failure  Planned Procedure:  cystectomy and bilateral nephrectomy    HISTORY AND PHYSICAL     HISTORY OF PRESENT ILLNESS  Shelby Barron is a 82 y.o. female with a history of bladder cancer and renal failure    PAST MEDICAL HISTORY  Past Medical History:   Diagnosis Date    Acid reflux     Asthma, well controlled     Cancer of kidney     Chronic lower back pain     Congestive heart disease     COPD (chronic obstructive pulmonary disease)     CVA (cerebral infarction) 01/12/2003    Diabetes     Diabetic nephropathy     Fibroids     Gout     H/O: gout     Hyperlipidemia     Hypertension     Neuropathy of hand     SOB (shortness of breath)        PAST SURGICAL HISTORY  Past Surgical History:   Procedure Laterality Date    APPENDECTOMY (OPEN)      DRAIN (OTHER) N/A 05/31/2022    Procedure: DRAIN (OTHER);  Surgeon: Hope Pigeon, MD;  Location: [facility];  Service: Interventional Radiology;  Laterality: N/A;    HERNIA REPAIR      HYSTERECTOMY      PARTIAL NEPHRECTOMY      tumor removed from kidney  cJanuary 2015    TUNNELED CATH PLACEMENT (PERMCATH) N/A 06/01/2022    Procedure: TUNNELED CATH PLACEMENT;  Surgeon: Suszanne Finch, MD;  Location: [facility];  Service: Interventional Radiology;  Laterality: N/A;    TURBT         SOCIAL HISTORY  Social History     Socioeconomic History    Marital status: Married   Tobacco Use    Smoking status: Former    Smokeless tobacco: Never   Advertising account planner    Vaping status: Never Used   Substance and Sexual Activity    Alcohol use: No    Drug use: No     Social Determinants of Health     Food Insecurity: No Food Insecurity (05/26/2022)    Hunger Vital Sign     Worried About Running Out of Food in the Last Year: Never true     Ran Out of Food in the Last  Year: Never true   Transportation Needs: No Transportation Needs (05/26/2022)    PRAPARE - Therapist, art (Medical): No     Lack of Transportation (Non-Medical): No    Received from [facility]    Social Network   Intimate Partner Violence: Not At Risk (05/26/2022)    Humiliation, Afraid, Rape, and Kick questionnaire     Fear of Current or Ex-Partner: No     Emotionally Abused: No     Physically Abused: No     Sexually Abused: No   Housing Stability: Unknown (05/26/2022)    Housing Stability Vital Sign     Unable to Pay  for Housing in the Last Year: No     Unstable Housing in the Last Year: No       ALLERGIES  Allergies   Allergen Reactions    Metrizamide Shortness Of Breath and Nausea Only    Rosuvastatin Other (See Comments)     myopathy    myopathy   myopathy    myopathy myopathy      myopathy    myopathy, myopathy    Iodinated Contrast Media Other (See Comments)     Nausea and SOB per patient   On Dialysisi    Nausea and SOB per patient    Nausea and SOB per patient      Nausea and SOB per patient   Nausea and SOB per patient    Nausea and SOB per patient      Nausea and SOB per patient Nausea and SOB per patient      Nausea and SOB per patient Nausea and SOB per patient Nausea and SOB per patient      Nausea and SOB per patient  Nausea and SOB per patient Nausea and SOB per patient    Nausea and SOB per patient   Nausea and SOB per patient    Pioglitazone      Weight gain    Sulfa Antibiotics Hives     blister       MEDICATIONS  Outpatient Medications Marked as Taking for the 06/28/22 encounter [facility] Encounter)   Medication Sig Dispense Refill    allopurinol (ZYLOPRIM) 100 MG tablet Take 1 tablet (100 mg) by mouth daily      atorvastatin (LIPITOR) 40 MG tablet Take 1 tablet (40 mg) by mouth daily      conjugated estrogens (PREMARIN) vaginal cream Place vaginally daily      insulin aspart (NOVOLOG) 100 UNIT/ML injection Inject 5-10 Units into the skin as needed      Insulin Degludec  Evaristo Bury) 100 UNIT/ML Solution Inject 36 Units into the skin daily      metoprolol tartrate (LOPRESSOR) 25 MG tablet Take 0.5 tablets (12.5 mg) by mouth daily      montelukast (SINGULAIR) 10 MG tablet Take 1 tablet (10 mg) by mouth nightly      ondansetron (Zofran) 4 MG tablet Take 1 tablet (4 mg) by mouth every 8 (eight) hours as needed for Nausea 20 tablet 0    oxybutynin XL (DITROPAN-XL) 5 MG 24 hr tablet Take 1 tablet (5 mg) by mouth daily      oxyCODONE (OXY-IR) 5 MG capsule Take 1 capsule (5 mg) by mouth every 4 (four) hours as needed      pantoprazole (PROTONIX) 40 MG tablet Take 1 tablet (40 mg) by mouth daily      pregabalin (LYRICA) 50 MG capsule Take 1 capsule (50 mg) by mouth Per pt has not been taking      sevelamer carbonate (RENVELA) 800 MG tablet Take 1 tablet (800 mg) by mouth 2 (two) times daily      torsemide (DEMADEX) 100 MG tablet Take 1 tablet (100 mg) by mouth daily      umeclidinium-vilanterol (Anoro Ellipta) 62.5-25 MCG/ACT Aerosol Pwdr, Breath Activated Inhale 1 puff into the lungs daily      Zolpidem Tartrate (AMBIEN PO) Take 5 mg by mouth as needed         FAM HX  Family History   Problem Relation Age of Onset    Cancer Mother     Heart failure Father  Cancer Maternal Aunt     Diabetes Maternal Aunt     Cancer Paternal Aunt        REVIEW OF SYSTEMS     Ten point review of systems negative or as per HPI and below endorsements.    PHYSICAL EXAM     Vital Signs:   Vitals:    06/28/22 0630   BP: 103/64   Pulse: 88   Resp: (!) 30   Temp: 98.2 F (36.8 C)   TempSrc: Oral   SpO2: 93%       Constitutional: Patient speaks freely in full sentences.   Cardio : normal  Respiratory: Normal rate. No retractions or increased work of breathing.   Abdomen: non-distended, soft, non-tender, no rebound or guarding  Skin: no rashes, jaundice or other lesions  Neurologic: Normal  Psychiatric: AAOx3, affect and mood appropriate. The patient is alert, interactive, appropriate.      Patient is clinically  ready for procedure:  Yes    Neldon Newport, MD  06/28/2022, 7:25 AM

## 2022-06-28 NOTE — Plan of Care (Signed)
Pt has bilateral wrist restraints. Pt sedated and vented.    Problem: Moderate/High Fall Risk Score >5  Goal: Patient will remain free of falls  Outcome: Progressing  Flowsheets (Taken 06/28/2022 1700)  Moderate Risk (6-13):   MOD-Consider activation of bed alarm if appropriate   MOD-Apply bed exit alarm if patient is confused   MOD-Remain with patient during toileting   MOD-Re-orient confused patients   LOW-Fall Interventions Appropriate for Low Fall Risk   MOD-Floor mat at bedside (where available) if appropriate   MOD-Consider a move closer to Nurses Station     Problem: Non-Violent Restraints Interdisciplinary Plan  Goal: Will be injury free during the use of non-violent restraints  Outcome: Progressing  Flowsheets (Taken 06/28/2022 1937)  Will be injury free during the use of non-violent restraints:   Attempt all alternatives before use of restraints   Initiate least restrictive type of restraint that is effective   Provide and maintain safe environment   Notify family of initiation of restraints   Include patient/family/caregiver in decisions related to safety   Ensure safety devices are properly applied and maintained   Document observed patient actions according to protocol   Nurse to accompany patient off unit when on restraints   Remove restraints before the indicated maximum length of time when meets criteria for discontinuation   Document significant changes in patient condition   Provide debriefing as soon as possible and appropriate   Reassess need for continued restraints   Ensure that order for restraints has not expired     Problem: Compromised Sensory Perception  Goal: Sensory Perception Interventions  Outcome: Progressing  Flowsheets (Taken 06/28/2022 1700)  Sensory Perception Interventions: Offload heels, Pad bony prominences, Reposition q 2hrs/turn Clock, Q2 hour skin assessment under devices if present     Problem: Compromised Activity/Mobility  Goal: Activity/Mobility Interventions  Outcome:  Progressing  Flowsheets (Taken 06/28/2022 1700)  Activity/Mobility Interventions: Pad bony prominences, TAP Seated positioning system when OOB, Promote PMP, Reposition q 2 hrs / turn clock, Offload heels     Problem: Compromised Nutrition  Goal: Nutrition Interventions  Outcome: Progressing  Flowsheets (Taken 06/28/2022 1700)  Nutrition Interventions: Discuss nutrition at Rounds, I&Os, Document % meal eaten, Daily weights     Problem: Compromised Friction/Shear  Goal: Friction and Shear Interventions  Outcome: Progressing     Problem: SCIP  Goal: SCIP measures are followed  Outcome: Progressing  Flowsheets (Taken 06/28/2022 1937)  SCIP measures are followed: Administer antibiotics as ordered     Problem: Inadequate Airway Clearance  Goal: Patent Airway maintained  Outcome: Progressing  Flowsheets (Taken 06/28/2022 1937)  Patent airway maintained:   Position patient for maximum ventilatory efficiency   Provide adequate fluid intake to liquefy secretions   Reposition patient every 2 hours and as needed unless able to self-reposition   Suction secretions as needed  Goal: Normal respiratory rate/effort achieved/maintained  Outcome: Progressing     Problem: Infection Prevention  Goal: Free from infection  Outcome: Progressing  Flowsheets (Taken 06/28/2022 1937)  Free from infection:   Monitor/assess vital signs   Encourage/assist patient to turn, cough and perform deep breathing every 2 hours   Assess incision for evidence of healing   Monitor/assess output from surgical drain if present   Monitor/assess lab values and report abnormal values   Assess for signs and symptoms of infection     Problem: Constipation  Goal: Fluid and electrolyte balance are achieved/maintained  Outcome: Progressing  Flowsheets (Taken 06/28/2022 1937)  Fluid and electrolyte  balance are achieved/maintained:   Monitor/assess lab values and report abnormal values   Assess and reassess fluid and electrolyte status   Observe for cardiac arrhythmias    Monitor for muscle weakness  Goal: Elimination patterns are normal or improving  Outcome: Progressing     Problem: Safety  Goal: Patient will be free from injury during hospitalization  Outcome: Progressing  Flowsheets (Taken 06/28/2022 1937)  Patient will be free from injury during hospitalization:   Assess patient's risk for falls and implement fall prevention plan of care per policy   Provide and maintain safe environment   Use appropriate transfer methods   Hourly rounding   Include patient/ family/ care giver in decisions related to safety   Ensure appropriate safety devices are available at the bedside   Assess for patients risk for elopement and implement Elopement Risk Plan per policy   Provide alternative method of communication if needed (communication boards, writing)  Goal: Patient will be free from infection during hospitalization  Outcome: Progressing  Flowsheets (Taken 06/28/2022 1937)  Free from Infection during hospitalization:   Assess and monitor for signs and symptoms of infection   Monitor lab/diagnostic results   Monitor all insertion sites (i.e. indwelling lines, tubes, urinary catheters, and drains)   Encourage patient and family to use good hand hygiene technique     Problem: Pain  Goal: Pain at adequate level as identified by patient  Outcome: Progressing  Flowsheets (Taken 06/28/2022 1937)  Pain at adequate level as identified by patient:   Identify patient comfort function goal   Assess for risk of opioid induced respiratory depression, including snoring/sleep apnea. Alert healthcare team of risk factors identified.   Assess pain on admission, during daily assessment and/or before any "as needed" intervention(s)   Reassess pain within 30-60 minutes of any procedure/intervention, per Pain Assessment, Intervention, Reassessment (AIR) Cycle   Evaluate if patient comfort function goal is met   Evaluate patient's satisfaction with pain management progress   Offer non-pharmacological pain  management interventions     Problem: Side Effects from Pain Analgesia  Goal: Patient will experience minimal side effects of analgesic therapy  Outcome: Progressing     Problem: Hemodynamic Status: Cardiac  Goal: Stable vital signs and fluid balance  Outcome: Progressing  Flowsheets (Taken 06/28/2022 1937)  Stable vital signs and fluid balance:   Assess signs and symptoms associated with cardiac rhythm changes   Monitor lab values     Problem: Renal Instability  Goal: Fluid and electrolyte balance are achieved/maintained  Outcome: Progressing  Flowsheets (Taken 06/28/2022 1937)  Fluid and electrolyte balance are achieved/maintained:   Monitor/assess lab values and report abnormal values   Assess and reassess fluid and electrolyte status   Observe for cardiac arrhythmias   Monitor for muscle weakness   Follow fluid restrictions/IV/PO parameters     Problem: Patient Receiving Advanced Renal Therapies  Goal: Therapy access site remains intact  Outcome: Progressing  Flowsheets (Taken 06/28/2022 1937)  Therapy access site remains intact:   Assess therapy access site   Change therapy access site dressing as needed

## 2022-06-28 NOTE — H&P (Addendum)
[facility]  History and Physical      Patient Name: Shelby Barron  MRN: [id number]  Room: [facility]  Code Status: Full Code      Chief Complaint / Primary Reason for MS[facility] Evaluation   Hemorrhagic shock    History of Presenting Illness   Shelby Barron is a 82 y.o. female with history of COPD, [name], type 2 diabetes, renal cell carcinoma status post partial bilateral nephrectomy, [name], new diagnosis of urothelial cancer.      She was recently admitted in May 2024 at which time she was transitioned from PD to HD.  At that time was diagnosed with urothelial cancer.  She presented today for scheduled cystectomy and completion nephrectomy with urology.  Her last Plavix dose was 6/3, her last dialysis was 6/8.  She had preop clearance with cardiology which included an echo.    In the OR course was complicated by  Small bowel enterotomy -General surgery called in enterotomy repaired with initial plan to put patient back into continuity    Later in the case significant bleeding during nephrectomy/cystectomy.  Approximately 1 L EBL.  Given significant bleeding decision made to leave patient in discontinuity and abdomen open with plans to return to the OR tomorrow.    In the OR given  3 units PRBC  3000 mL crystalloid  colloid    Subjective   Past Medical History:     Past Medical History:   Diagnosis Date    Acid reflux     Asthma, well controlled     Cancer of kidney     Chronic lower back pain     Congestive heart disease     COPD (chronic obstructive pulmonary disease)     CVA (cerebral infarction) 01/12/2003    Diabetes     Diabetic nephropathy     Fibroids     Gout     H/O: gout     Hyperlipidemia     Hypertension     Neuropathy of hand     SOB (shortness of breath)        Past Surgical History:     Past Surgical History:   Procedure Laterality Date    APPENDECTOMY (OPEN)      DRAIN (OTHER) N/A 05/31/2022    Procedure: DRAIN (OTHER);  Surgeon: Hope Pigeon, MD;   Location: [facility];  Service: Interventional Radiology;  Laterality: N/A;    HERNIA REPAIR      HYSTERECTOMY      PARTIAL NEPHRECTOMY      tumor removed from kidney  cJanuary 2015    TUNNELED CATH PLACEMENT (PERMCATH) N/A 06/01/2022    Procedure: TUNNELED CATH PLACEMENT;  Surgeon: Suszanne Finch, MD;  Location: [facility];  Service: Interventional Radiology;  Laterality: N/A;    TURBT         Family History:     Family History   Problem Relation Age of Onset    Cancer Mother     Heart failure Father     Cancer Maternal Aunt     Diabetes Maternal Aunt     Cancer Paternal Aunt        Social History:     Social History     Socioeconomic History    Marital status: Married     Spouse name: Not on file    Number of children: Not on file    Years of education: Not on file  Highest education level: Not on file   Occupational History    Not on file   Tobacco Use    Smoking status: Former    Smokeless tobacco: Never   Vaping Use    Vaping status: Never Used   Substance and Sexual Activity    Alcohol use: No    Drug use: No    Sexual activity: Not on file   Other Topics Concern    Not on file   Social History Narrative    Not on file     Social Determinants of Health     Financial Resource Strain: Not on file   Food Insecurity: No Food Insecurity (05/26/2022)    Hunger Vital Sign     Worried About Running Out of Food in the Last Year: Never true     Ran Out of Food in the Last Year: Never true   Transportation Needs: No Transportation Needs (05/26/2022)    PRAPARE - Therapist, art (Medical): No     Lack of Transportation (Non-Medical): No   Physical Activity: Not on file   Stress: Not on file   Social Connections: Unknown (02/01/2022)    Received from [facility]    Social Network     Social Network: Not on file   Intimate Partner Violence: Not At Risk (05/26/2022)    Humiliation, Afraid, Rape, and Kick questionnaire     Fear of Current or Ex-Partner: No     Emotionally Abused: No     Physically Abused: No      Sexually Abused: No   Housing Stability: Unknown (05/26/2022)    Housing Stability Vital Sign     Unable to Pay for Housing in the Last Year: No     Number of Places Lived in the Last Year: Not on file     Unstable Housing in the Last Year: No       Allergies:     Allergies   Allergen Reactions    Metrizamide Shortness Of Breath and Nausea Only    Rosuvastatin Other (See Comments)     myopathy    myopathy   myopathy    myopathy myopathy      myopathy    myopathy, myopathy    Iodinated Contrast Media Other (See Comments)     Nausea and SOB per patient   On Dialysisi    Nausea and SOB per patient    Nausea and SOB per patient      Nausea and SOB per patient   Nausea and SOB per patient    Nausea and SOB per patient      Nausea and SOB per patient Nausea and SOB per patient      Nausea and SOB per patient Nausea and SOB per patient Nausea and SOB per patient      Nausea and SOB per patient  Nausea and SOB per patient Nausea and SOB per patient    Nausea and SOB per patient   Nausea and SOB per patient    Pioglitazone      Weight gain    Sulfa Antibiotics Hives     blister          Objective   Physical Examination   Vitals Temp:  [98.2 F (36.8 C)]   Heart Rate:  [88]   Resp Rate:  [30]   BP: (103)/(64)   SpO2:  [93 %]   Weight:  [87.1 kg (192 lb)]  BMI (calculated):  [34]  // Temperature with 24 range  Vent Settings PEEP/EPAP:  [5 cm H20-8 cm H20] 5 cm H20    General:     Neuro:    intubated and sedated    Lungs:   clear to auscultation, no wheezes, rales or rhonchi, symmetric air entry     Cardiac:   normal rate, regular rhythm, normal S1, S2, no murmurs, rubs, clicks or gallops     Abdomen:    Abthera in place    Extremities:   no pedal edema    Skin:     Warm, dry and intact    Assessment   Shelby Barron is a 82 y.o. female who presents with hemorrhagic shock secondary to intraoperative blood loss and small bowel enterotomy.  Now in discontinuity with open abdomen      Patient has BMI=Body mass index is 34.01  kg/m.  Diagnosis: No additional diagnosis based on BMI criteria          Recent Labs     06/28/22  1341 06/28/22  0702   Potassium 3.6 3.4*     Diagnosis: No additional diagnosis         Recent Labs   Lab 06/28/22  1341   Hemoglobin 4.1*   Hematocrit 13.4*   MCV 99.3*   WBC 17.05*   Platelet Count 162     Recent Labs   Lab 06/02/22  0514   Iron 62   TIBC 145*   Iron Saturation 43     Anemia Diagnosis: No new diagnosis         ICU Checklist  Sedation:   CAM-ICU:     CAM ICU:   Last Documented RASS:     Currently ordered infusions:    sodium chloride 25 mL/hr at 06/28/22 1037    Reviewed: Yes   Mobility:   Current Mobility Level:    Current PT Order: No  Current OT Order: No Reviewed: Yes   Respiratory (n/a if blank):   Ventilator Time:    Last Recorded Vent Mode:  PEEP/EPAP:  [5 cm H20-8 cm H20] 5 cm H20 Reviewed: Yes   Gastrointenstinal  Last Bowel Movement:   Last BM Date: 06/03/22 Reviewed: Yes   CAUTI Prevention (n/a if blank):  Foley Day:        Reviewed: Yes   Blood Steam Infection Prevention (n/a if blank):   Permacath Catheter - Tunneled 06/01/22 Internal Jugular Right Reviewed: Yes   DVT Chemoprophylaxis (none if blank):    Reviewed: Yes            Plan     NEUROLOGICAL:   #History of CVA  Hold home plavix  Sedated with propofol/fentanyl  Titrating for RASS of -2    CARDIOVASCULAR:   #Mixed shock - hemorrhagic vs septic  Had EBL approximately 1 L in the OR also with small bowel enterotomy  Transfused 3 units PRBC in OR  Levophed gtt. for BP support titrating for MAP greater than 65  CBC, coags every 6 hours  Will resuscitate as indicated  Bleeding seems to have abated but will give 1 dose of DDAVP for uremic bleeding  Had TTE as part of preop cardiac clearance  Mild aortic stenosis (AVA 1.7), grade 1 diastolic dysfunction, normal LV/RV systolic function    PULMONARY:   #Acute hypoxemic respiratory failure  #COPD  Adjusted ventilator targeting lung protective ventilation strategy (Tidal volume 4-8 cc/kg,  limiting plateau pressure < 30 ccH2O)  Chest x-ray reviewed ET tube deep lower retract 2 cm  Will plan to keep intubated until return to the OR    RENAL:   #ESRD  On HD Tuesday Thursday Saturday  Last dialysis was 6/8  Labs reviewed no acute indication for RRT will likely need dialysis before considering extubation and ideally before return to the OR for closure  Nephrology consulted    GASTROINTESTINAL:  #S/P Cystectomy and completion nephrectomy  POD0 from cystectomy and nephrectomy complicated by small bowel enterotomy and hemorrhagic shock  Resuscitation as above  Patient in discontinuity with open abdomen, tentative plan for return to the OR tomorrow for closure  Will cover with Zosyn for any intra-abdominal spillage secondary to enterotomy    INFECTIOUS DISEASE:   Zosyn for intra-abdominal spillage    HEMATOLOGY/ONCOLOGY:   #Renal cell carcinoma  #Urothelial carcinoma  - VTE ppx: SCDs alone given bleeding    ENDOCRONOLOGY/RHEUMATOLOGY:   #Diabetes Mellitus type 2  Sliding scale insulin  On Lantus 12U daily at home    FAMILY UPDATE:   Patient's daughter was updated, they were appreciative and all questions were answered.     Advance Care Planning: Open ACP Navigator  Code Status: Full Code  Next of Kin:  Primary Emergency Contact: Weiler,Nicole           ADVANCE CARE PLAN             This patient is critically ill with life-threatening condition(s) and a high probability of sudden clinically significant deterioration due to the condition(s) noted in the assessment and plan, which requires the highest level of physician/advance practice provider preparedness to intervene urgently. Full attention to the direct care of this patient was provided for the period of time noted below. Any critical care time performed today is exclusive of teaching and billable procedures and not overlapping with any other physicians or advance practice providers.    I have personally assessed the patient and based my assessment and  medical decision-making on a review of the patient's history and 24-hour interval events along with medical records, physical examination, vital signs, analysis of recent laboratory results, evaluation of radiology images, monitoring data for potential decompensation, and additional findings found in detail within ICU team notes. The findings and plan of care was discussed with the care team.    Total critical care time: 52 minutes during this encounter.    Velia Meyer, MD   06/28/2022 4:59 PM

## 2022-06-28 NOTE — OR Nursing (Signed)
UPPER AND LOWER ABDOMINAL PORTABLE X-RAY WAS TAKEN. DR. Bernardo Heater THE RADIOLOGIST CONFIRMED THAT THERE IS NO RETAINED NEEDLE OR ANY SURGICAL ITEM FOUND IN THE ABDOMEN. DR. Celine Mans, DR. [facility] AND DR. KLEIN WERE AWARE AND INFORMED.

## 2022-06-28 NOTE — Anesthesia Postprocedure Evaluation (Signed)
Anesthesia Post Evaluation    Patient: Shelby Barron    Procedure(s):  CYSTECTOMY, RADICAL  BILATERAL OPEN RADICAL NEPHRECTOMY AND LYSIS OF ASHESION  RESECTION SMALL BOWEL  ABTHERA WOUND VAC APPLICATION    Anesthesia type: general    Last Vitals:   Vitals Value Taken Time   BP 119/54 06/28/22 1646   Temp 37.2 C (98.9 F) 06/28/22 1625   Pulse 82 06/28/22 1600   Resp 14 06/28/22 1600   SpO2 99 % 06/28/22 1600                 Anesthesia Post Evaluation:     Patient Evaluated: ICU  Patient Participation: complete - patient cannot participate  Level of Consciousness: obtunded/minimal responses    Pain Management: adequate  Multimodal analgesia pain management approach    Airway Patency: patent        Anesthetic complications: No      PONV Status: none    Cardiovascular status: hemodynamically unstable and hypotensive (remains on vasopressors)  Respiratory status: acceptable, ETT and ventilator  Hydration status: hypovolemic          Signed by: Joaquim Lai, MD, 06/28/2022 5:19 PM

## 2022-06-28 NOTE — Consults (Signed)
GENERAL SURGERY   Office:  [phone]    Date Time: 06/28/22 1:17 PM  Patient Name: Shelby Barron  Attending Physician: Marlaine Hind, MD  Consult requested by: Neldon Newport, MD  Reason for consultation: intraoperative evaluation    Assessment:   82 y.o. female who presents with bladder cancer and renal failure evaluated intraoperatively for enterotomy, underwent small bowel resection and negative pressure temporary abdominal closure with  bilateral nephrectomy and cystectomy by urology.     Plan:   - Recommend diet: NPO  - NGT-cLWS  - Wound vac to abethera setting  - Return to OR for abdominal closure 06/29/22  - DVT ppx  - AM labs   - Multimodal pain control  - Plans discussed with Dr. Ulyess Mort  - Plans conveyed to primary team       Signed by:   Adele Dan, MD  General Surgery, PGY4  June 28, 2022    *This note was generated by the Epic EMR system/ Dragon speech recognition and may contain inherent errors or omissions not intended by the user. Grammatical errors, random word insertions, deletions, pronoun errors and incomplete sentences are occasional consequences of this technology due to software limitations. Not all errors are caught or corrected. If there are questions or concerns about the content of this note or information contained within the body of this dictation they should be addressed directly with the author for clarification    Attending Note:    I, Marlaine Hind, MD, have personally seen and examined this patient and have participated in their care. I agree with all clinical information, including the physical exam, patient history, and planning as documented by the physician assistant/resident. All notes, labs, and imaging were reviewed.  The patient was seen on the date the note was written      Marlaine Hind, MD  [facility]  [phone]     History of Present Illness:   Shelby Barron is a 82 y.o. female with PMH of bladder cancer, gerd, CHF, CVA, DM, ESRD  on HD, HLD, HTN. PSH of open appendectomy, hysterectomy, partial nephrectomy, ventral hernia repair with mesh. Presents to the hospital for elective bilateral nephrectomy and cystectomy. General surgery consulted intraoperatively secondary to concern of injury to bowel.     Past Medical History:     Past Medical History:   Diagnosis Date    Acid reflux     Asthma, well controlled     Cancer of kidney     Chronic lower back pain     Congestive heart disease     COPD (chronic obstructive pulmonary disease)     CVA (cerebral infarction) 01/12/2003    Diabetes     Diabetic nephropathy     Fibroids     Gout     H/O: gout     Hyperlipidemia     Hypertension     Neuropathy of hand     SOB (shortness of breath)        Past Surgical History:     Past Surgical History:   Procedure Laterality Date    APPENDECTOMY (OPEN)      DRAIN (OTHER) N/A 05/31/2022    Procedure: DRAIN (OTHER);  Surgeon: Hope Pigeon, MD;  Location: [facility];  Service: Interventional Radiology;  Laterality: N/A;    HERNIA REPAIR      HYSTERECTOMY      PARTIAL NEPHRECTOMY      tumor removed from kidney  cJanuary 2015  TUNNELED CATH PLACEMENT (PERMCATH) N/A 06/01/2022    Procedure: TUNNELED CATH PLACEMENT;  Surgeon: Suszanne Finch, MD;  Location: [facility];  Service: Interventional Radiology;  Laterality: N/A;    TURBT         Family History:     Family History   Problem Relation Age of Onset    Cancer Mother     Heart failure Father     Cancer Maternal Aunt     Diabetes Maternal Aunt     Cancer Paternal Aunt        Social History:     Social History     Socioeconomic History    Marital status: Married     Spouse name: Not on file    Number of children: Not on file    Years of education: Not on file    Highest education level: Not on file   Occupational History    Not on file   Tobacco Use    Smoking status: Former    Smokeless tobacco: Never   Vaping Use    Vaping status: Never Used   Substance and Sexual Activity    Alcohol use: No    Drug use: No     Sexual activity: Not on file   Other Topics Concern    Not on file   Social History Narrative    Not on file     Social Determinants of Health     Financial Resource Strain: Not on file   Food Insecurity: No Food Insecurity (05/26/2022)    Hunger Vital Sign     Worried About Running Out of Food in the Last Year: Never true     Ran Out of Food in the Last Year: Never true   Transportation Needs: No Transportation Needs (05/26/2022)    PRAPARE - Therapist, art (Medical): No     Lack of Transportation (Non-Medical): No   Physical Activity: Not on file   Stress: Not on file   Social Connections: Unknown (02/01/2022)    Received from [facility]    Social Network     Social Network: Not on file   Intimate Partner Violence: Not At Risk (05/26/2022)    Humiliation, Afraid, Rape, and Kick questionnaire     Fear of Current or Ex-Partner: No     Emotionally Abused: No     Physically Abused: No     Sexually Abused: No   Housing Stability: Unknown (05/26/2022)    Housing Stability Vital Sign     Unable to Pay for Housing in the Last Year: No     Number of Places Lived in the Last Year: Not on file     Unstable Housing in the Last Year: No     Allergies:     Allergies   Allergen Reactions    Metrizamide Shortness Of Breath and Nausea Only    Rosuvastatin Other (See Comments)     myopathy    myopathy   myopathy    myopathy myopathy      myopathy    myopathy, myopathy    Iodinated Contrast Media Other (See Comments)     Nausea and SOB per patient   On Dialysisi    Nausea and SOB per patient    Nausea and SOB per patient      Nausea and SOB per patient   Nausea and SOB per patient    Nausea and SOB per patient  Nausea and SOB per patient Nausea and SOB per patient      Nausea and SOB per patient Nausea and SOB per patient Nausea and SOB per patient      Nausea and SOB per patient  Nausea and SOB per patient Nausea and SOB per patient    Nausea and SOB per patient   Nausea and SOB per patient     Pioglitazone      Weight gain    Sulfa Antibiotics Hives     blister       Medications:     Prior to Admission medications    Medication Sig Start Date End Date Taking? Authorizing Provider   allopurinol (ZYLOPRIM) 100 MG tablet Take 1 tablet (100 mg) by mouth daily   Yes [provider]   atorvastatin (LIPITOR) 40 MG tablet Take 1 tablet (40 mg) by mouth daily   Yes [provider]   conjugated estrogens (PREMARIN) vaginal cream Place vaginally daily   Yes [provider]   insulin aspart (NOVOLOG) 100 UNIT/ML injection Inject 5-10 Units into the skin as needed   Yes [provider]   Insulin Degludec Evaristo Bury) 100 UNIT/ML Solution Inject 36 Units into the skin daily   Yes [provider]   metoprolol tartrate (LOPRESSOR) 25 MG tablet Take 0.5 tablets (12.5 mg) by mouth daily   Yes [provider]   montelukast (SINGULAIR) 10 MG tablet Take 1 tablet (10 mg) by mouth nightly   Yes [provider]   ondansetron (Zofran) 4 MG tablet Take 1 tablet (4 mg) by mouth every 8 (eight) hours as needed for Nausea 06/04/22  Yes Yousuf, Munib A, MD   oxybutynin XL (DITROPAN-XL) 5 MG 24 hr tablet Take 1 tablet (5 mg) by mouth daily   Yes [provider]   oxyCODONE (OXY-IR) 5 MG capsule Take 1 capsule (5 mg) by mouth every 4 (four) hours as needed   Yes [provider]   pantoprazole (PROTONIX) 40 MG tablet Take 1 tablet (40 mg) by mouth daily   Yes [provider]   pregabalin (LYRICA) 50 MG capsule Take 1 capsule (50 mg) by mouth Per pt has not been taking   Yes [provider]   sevelamer carbonate (RENVELA) 800 MG tablet Take 1 tablet (800 mg) by mouth 2 (two) times daily   Yes [provider]   torsemide (DEMADEX) 100 MG tablet Take 1 tablet (100 mg) by mouth daily   Yes [provider]   umeclidinium-vilanterol (Anoro Ellipta) 62.5-25 MCG/ACT Aerosol Pwdr, Breath Activated Inhale 1 puff into the lungs daily    Yes [provider]   Zolpidem Tartrate (AMBIEN PO) Take 5 mg by mouth as needed   Yes [provider]   b complex vitamins capsule Take 1 capsule by mouth daily    [provider]   cinacalcet (SENSIPAR) 30 MG tablet Take 1 tablet (30 mg) by mouth daily    [provider]   clopidogrel (PLAVIX) 75 MG tablet Take 1 tablet (75 mg) by mouth daily    [provider]   hyoscyamine (LEVSIN) 0.125 MG tablet Take 1 tablet (0.125 mg) by mouth every 4 (four) hours as needed for Cramping 06/02/22   Yousuf, Munib A, MD       Review of Systems:   Review of Systems   Reason unable to perform ROS: patient intubated and sedated.        Physical Exam:  Vitals:    06/28/22 0630   BP: 103/64   Pulse: 88   Resp: (!) 30   Temp: 98.2 F (36.8 C)   SpO2: 93%       Intake and Output Summary (Last 24 hours) at Date Time    Intake/Output Summary (Last 24 hours) at 06/28/2022 1317  Last data filed at 06/28/2022 1220  Gross per 24 hour   Intake 750 ml   Output 200 ml   Net 550 ml     Unable to perform full physical exam secondary to intraoperative evaluation.  Physical Exam  Vitals reviewed.   Cardiovascular:      Rate and Rhythm: Normal rate and regular rhythm.      Pulses: Normal pulses.   Pulmonary:      Comments: PEEP/EPAP:  [5 cm H20-8 cm H20] 6 cm H20    Abdominal:      Palpations: Abdomen is soft.      Comments: Bowel tethered to abdominal wall, enterotomy    Skin:     General: Skin is warm.           Labs:     Results       Procedure Component Value Units Date/Time    Arterial Blood Gas [[id number]]  (Abnormal) Collected: 06/28/22 1158    Specimen: Blood, Arterial Updated: 06/28/22 1222     Arterial pH 7.395     Arterial pCO2 44.2 mmHg      Arterial pO2 153.0 mmHg      Arterial HCO3 26.7 mEq/L      Arterial CO2 26.9 mEq/L      Arterial Base Excess 2.2 mEq/L      Arterial O2 Saturation 98.8 %      Temperature 36.5 C     Basic Metabolic Panel [[id number]]  (Abnormal) Collected: 06/28/22 0702     Specimen: Blood, Venous Updated: 06/28/22 0726     Glucose 106 mg/dL      BUN 22 mg/dL      Creatinine 5.3 mg/dL      Calcium 8.8 mg/dL      Sodium 027 mEq/L      Potassium 3.4 mEq/L      Chloride 94 mEq/L      CO2 29 mEq/L      Anion Gap 14.0     GFR 7.5 mL/min/1.73 m2               Patient has BMI=Body mass index is 34.01 kg/m.  Diagnosis: Obesity based on BMI criteria          Recent Labs     06/28/22  0702   Potassium 3.4*     Diagnosis: Hypokalemia       Patient has BMI=Body mass index is 34.01 kg/m.  Diagnosis: Obesity based on BMI criteria        Recent Labs     06/28/22  0702   Potassium 3.4*     Diagnosis: Hypokalemia    Rads:     Radiology Results (24 Hour)       ** No results found for the last 24 hours. **            Problem List:     Patient Active Problem List   Diagnosis    Type 2 diabetes mellitus with kidney complication, with long-term current use of insulin    Diabetes mellitus type II, uncontrolled    Neuropathy of hand    Left renal mass    Hamstring  injury    Peritonitis    ESRD (end stage renal disease)    Chronic diastolic congestive heart failure    Aortic atherosclerosis    Malignant neoplasm of urinary bladder, unspecified site    Malignant tumor of kidney, left    Benign essential hypertension    GERD (gastroesophageal reflux disease)    Leukocytosis    Bladder cancer

## 2022-06-28 NOTE — Progress Notes (Signed)
Initial Case Management Assessment and Discharge Planning [facility]   Patient Name: Shelby Barron, Shelby Barron   Date of Birth 1941-01-03   Attending Physician: Velia Meyer, MD   Primary Care Physician: Patsy Lager, MD   Length of Stay 0   Reason for Consult / Chief Complaint IDPA        Situation   Admission DX:   1. Hemorrhagic shock    2. Bladder cancer    3. Malignant neoplasm of urinary bladder, unspecified site    4. Malignant tumor of kidney, left        A/O Status: X 3    LACE Score: 7    Patient admitted from: OR  Admission Status: inpatient   Background     Advanced directive:   <no information>      Code Status:   Full Code     Residence: Multi-story home    PCP: PCP Not on File, MD  Patient Contact:   [phone] (home)     [phone] (mobile)     Emergency contact:   Extended Emergency Contact Information  Primary Emergency Contact: Illes,Nicole  Address: [address]           [place], [place] [address] Darden Amber of [employer] Phone: [phone]  Relation: Daughter      ADL/IADL's: Independent  Previous Level of function: To be evaluated    DME: Rollator (4 wheeled walker)    Pharmacy:     [id number], [place] - [address] AT [facility] [address]  [address]  [place] [address]  Phone: [phone] Fax: [phone]      Prescription Coverage: Yes    Home Health: The patient is not currently receiving home health services.    Previous SNF/AR: [facility]    COVID Vaccine Status: unknown    Date First IMM given: Unknown  UAI on file?: No  Transport for discharge? Mode of transportation: Sales executive - Family/Friend to drive patient  Agreeable to Home with family post-discharge:  Yes     Assessment   CM assessment done per chart review. Pt lives with her husband in a multilevel house; Self care; Independent. Pt has Hx of HH/SNF/DME use. Pt was staying with her dtr at [address], [place], [place] [address] prior to this hospitalization. Pt will  stay with her dtr in [place] after this hospitalization. Pt has PD and [place] f/u with patient 2x/month. Pt's nephrologist is Dr. Wynelle Link. CM to f/u on team recs and to notify dialysis liaison that pt is back in the hospital.   BARRIERS TO DISCHARGE: none     Recommendation   D/C Plan A: Home with family  D/C Plan B: Home with home health    Marylou Mccoy RN BSN CM I  [facility]  [phone]

## 2022-06-28 NOTE — Brief Op Note (Signed)
BRIEF OP NOTE    Date Time: 06/28/22 3:39 PM    Patient Name:   Shelby Barron    Date of Operation:   06/28/2022    Providers Performing:   Surgeon(s) and Role:       Primary: Neldon Newport, MD  Resident - Assisting: Adele Dan, MD  Assistant Surgeon: Marlaine Hind, MD      Operative Procedure:   Procedure(s):  Procedure(s):  CYSTECTOMY, RADICAL, TOTAL  Bilateral nephrectomy  Negative pressure temporary abdominal closure       Preoperative Diagnosis:   Pre-Op Diagnosis Codes:     * Malignant neoplasm of urinary bladder, unspecified site [C67.9]     * Malignant tumor of kidney, left [C64.2]    Postoperative Diagnosis:   Post-Op Diagnosis      Malignant neoplasm of bladder, left kidney, abdominal wall adhesions    Anesthesia:   General    Fluids (I/O):   EBL:  10 mL from general surgery part of case  Crystalloid: 3000 mL   Colloid: 750 mL   Blood Products: 3u PRBC mL    Currently on phenylephrine, receiving vasopressin pushes     Drains:   Drain: none    Specimens:     ID Type Source Tests Collected by Time Destination   1 : small bowel Tissue Small Intestine (Specify if applicable) SURGICAL PATHOLOGY Neldon Newport, MD 06/28/2022 1007    2 : Right kidney Tissue Kidney, Right SURGICAL PATHOLOGY Neldon Newport, MD 06/28/2022 1121    3 : LEFT KIDNEY Tissue Kidney, Left SURGICAL PATHOLOGY Neldon Newport, MD 06/28/2022 1222    4 : BLADDER Tissue Urinary bladder SURGICAL PATHOLOGY Neldon Newport, MD 06/28/2022 1400        Implants:   none    Findings:   Bowel adhered to abdominal wall at mesh, enterotomy found and small bowel section resected. Patient left in discontinuity given pressor requirement and acute blood loss anemia to hemoglobin 4.1.      Wound Class:   Clean-Contaminated    Complications:   none    Plans:   - Diet: NPO IVF  - Follow up post operative labs (CBC, TEG, lactate, BMP, magnesium, phosphorus )  - Resuscitate, transfuse for hemoglobin less than 7  - Return to OR 6/11 for abdominal  closure  - Abx: none indicated from general surgery perspective  - DVT ppx   - OOB/IS   - Abthera    Signed by: Adele Dan, MD  [facility]     Marlaine Hind, MD  General Surgeon  [facility]  [phone]

## 2022-06-29 ENCOUNTER — Encounter
Admission: RE | Disposition: A | Discharge status: Skilled Nursing Facility | Payer: Self-pay | Source: Home / Self Care | Attending: Student in an Organized Health Care Education/Training Program

## 2022-06-29 ENCOUNTER — Encounter: Payer: Self-pay | Admitting: Urology

## 2022-06-29 DIAGNOSIS — J9601 Acute respiratory failure with hypoxia: Secondary | ICD-10-CM

## 2022-06-29 DIAGNOSIS — N186 End stage renal disease: Secondary | ICD-10-CM

## 2022-06-29 DIAGNOSIS — R578 Other shock: Secondary | ICD-10-CM

## 2022-06-29 LAB — LAB USE ONLY - CBC WITH DIFFERENTIAL
Absolute Basophils: 0.04 10*3/uL (ref 0.00–0.08)
Absolute Basophils: 0.04 10*3/uL (ref 0.00–0.08)
Absolute Basophils: 0.04 10*3/uL (ref 0.00–0.08)
Absolute Basophils: 0.05 10*3/uL (ref 0.00–0.08)
Absolute Eosinophils: 0.04 10*3/uL (ref 0.00–0.44)
Absolute Eosinophils: 0.08 10*3/uL (ref 0.00–0.44)
Absolute Eosinophils: 0.06 10*3/uL (ref 0.00–0.44)
Absolute Eosinophils: 0.05 10*3/uL (ref 0.00–0.44)
Absolute Immature Granulocytes: 0.29 10*3/uL — ABNORMAL HIGH (ref 0.00–0.07)
Absolute Immature Granulocytes: 0.29 10*3/uL — ABNORMAL HIGH (ref 0.00–0.07)
Absolute Immature Granulocytes: 0.21 10*3/uL — ABNORMAL HIGH (ref 0.00–0.07)
Absolute Immature Granulocytes: 0.2 10*3/uL — ABNORMAL HIGH (ref 0.00–0.07)
Absolute Lymphocytes: 2.26 10*3/uL (ref 0.42–3.22)
Absolute Lymphocytes: 2.39 10*3/uL (ref 0.42–3.22)
Absolute Lymphocytes: 1.41 10*3/uL (ref 0.42–3.22)
Absolute Lymphocytes: 1.06 10*3/uL (ref 0.42–3.22)
Absolute Monocytes: 1.99 10*3/uL — ABNORMAL HIGH (ref 0.21–0.85)
Absolute Monocytes: 2.01 10*3/uL — ABNORMAL HIGH (ref 0.21–0.85)
Absolute Monocytes: 1.33 10*3/uL — ABNORMAL HIGH (ref 0.21–0.85)
Absolute Monocytes: 1.12 10*3/uL — ABNORMAL HIGH (ref 0.21–0.85)
Absolute Neutrophils: 16.16 10*3/uL — ABNORMAL HIGH (ref 1.10–6.33)
Absolute Neutrophils: 17.11 10*3/uL — ABNORMAL HIGH (ref 1.10–6.33)
Absolute Neutrophils: 13.98 10*3/uL — ABNORMAL HIGH (ref 1.10–6.33)
Absolute Neutrophils: 12.62 10*3/uL — ABNORMAL HIGH (ref 1.10–6.33)
Absolute nRBC: 0 10*3/uL (ref <=0.00)
Absolute nRBC: 0 10*3/uL (ref <=0.00)
Absolute nRBC: 0 10*3/uL (ref <=0.00)
Absolute nRBC: 0 10*3/uL (ref <=0.00)
Basophils %: 0.2 %
Basophils %: 0.2 %
Basophils %: 0.2 %
Basophils %: 0.3 %
Eosinophils %: 0.2 %
Eosinophils %: 0.4 %
Eosinophils %: 0.4 %
Eosinophils %: 0.3 %
Hematocrit: 21 % — ABNORMAL LOW (ref 34.7–43.7)
Hematocrit: 22 % — ABNORMAL LOW (ref 34.7–43.7)
Hematocrit: 24.2 % — ABNORMAL LOW (ref 34.7–43.7)
Hematocrit: 22.3 % — ABNORMAL LOW (ref 34.7–43.7)
Hemoglobin: 7.1 g/dL — ABNORMAL LOW (ref 11.4–14.8)
Hemoglobin: 7.4 g/dL — ABNORMAL LOW (ref 11.4–14.8)
Hemoglobin: 8.3 g/dL — ABNORMAL LOW (ref 11.4–14.8)
Hemoglobin: 7.8 g/dL — ABNORMAL LOW (ref 11.4–14.8)
Immature Granulocytes %: 1.4 %
Immature Granulocytes %: 1.3 %
Immature Granulocytes %: 1.2 %
Immature Granulocytes %: 1.3 %
Lymphocytes %: 10.9 %
Lymphocytes %: 10.9 %
Lymphocytes %: 8.3 %
Lymphocytes %: 7 %
MCH: 30.7 pg (ref 25.1–33.5)
MCH: 30.2 pg (ref 25.1–33.5)
MCH: 30.2 pg (ref 25.1–33.5)
MCH: 30.7 pg (ref 25.1–33.5)
MCHC: 33.8 g/dL (ref 31.5–35.8)
MCHC: 33.6 g/dL (ref 31.5–35.8)
MCHC: 34.3 g/dL (ref 31.5–35.8)
MCHC: 35 g/dL (ref 31.5–35.8)
MCV: 90.9 fL (ref 78.0–96.0)
MCV: 89.8 fL (ref 78.0–96.0)
MCV: 88 fL (ref 78.0–96.0)
MCV: 87.8 fL (ref 78.0–96.0)
MPV: 11.3 fL (ref 8.9–12.5)
MPV: 11.2 fL (ref 8.9–12.5)
MPV: 11.3 fL (ref 8.9–12.5)
MPV: 10.9 fL (ref 8.9–12.5)
Monocytes %: 9.6 %
Monocytes %: 9.2 %
Monocytes %: 7.8 %
Monocytes %: 7.4 %
Neutrophils %: 77.7 %
Neutrophils %: 78 %
Neutrophils %: 82.1 %
Neutrophils %: 83.7 %
Platelet Count: 136 10*3/uL — ABNORMAL LOW (ref 142–346)
Platelet Count: 147 10*3/uL (ref 142–346)
Platelet Count: 140 10*3/uL — ABNORMAL LOW (ref 142–346)
Platelet Count: 122 10*3/uL — ABNORMAL LOW (ref 142–346)
Preliminary Absolute Neutrophil Count: 16.16 10*3/uL — ABNORMAL HIGH (ref 1.10–6.33)
Preliminary Absolute Neutrophil Count: 17.11 10*3/uL — ABNORMAL HIGH (ref 1.10–6.33)
Preliminary Absolute Neutrophil Count: 13.98 10*3/uL — ABNORMAL HIGH (ref 1.10–6.33)
Preliminary Absolute Neutrophil Count: 12.62 10*3/uL — ABNORMAL HIGH (ref 1.10–6.33)
RBC: 2.31 10*6/uL — ABNORMAL LOW (ref 3.90–5.10)
RBC: 2.45 10*6/uL — ABNORMAL LOW (ref 3.90–5.10)
RBC: 2.75 10*6/uL — ABNORMAL LOW (ref 3.90–5.10)
RBC: 2.54 10*6/uL — ABNORMAL LOW (ref 3.90–5.10)
RDW: 15 % (ref 11–15)
RDW: 15 % (ref 11–15)
RDW: 15 % (ref 11–15)
RDW: 15 % (ref 11–15)
WBC: 20.78 10*3/uL — ABNORMAL HIGH (ref 3.10–9.50)
WBC: 21.92 10*3/uL — ABNORMAL HIGH (ref 3.10–9.50)
WBC: 17.03 10*3/uL — ABNORMAL HIGH (ref 3.10–9.50)
WBC: 15.1 10*3/uL — ABNORMAL HIGH (ref 3.10–9.50)
nRBC %: 0 /100 WBC (ref <=0.0)
nRBC %: 0 /100 WBC (ref <=0.0)
nRBC %: 0 /100 WBC (ref <=0.0)
nRBC %: 0 /100 WBC (ref <=0.0)

## 2022-06-29 LAB — CBC
Absolute nRBC: 0.02 10*3/uL — ABNORMAL HIGH (ref <=0.00)
Hematocrit: 25.4 % — ABNORMAL LOW (ref 34.7–43.7)
Hemoglobin: 8.5 g/dL — ABNORMAL LOW (ref 11.4–14.8)
MCH: 29.8 pg (ref 25.1–33.5)
MCHC: 33.5 g/dL (ref 31.5–35.8)
MCV: 89.1 fL (ref 78.0–96.0)
MPV: 11.1 fL (ref 8.9–12.5)
Platelet Count: 138 10*3/uL — ABNORMAL LOW (ref 142–346)
RBC: 2.85 10*6/uL — ABNORMAL LOW (ref 3.90–5.10)
RDW: 15 % (ref 11–15)
WBC: 18.82 10*3/uL — ABNORMAL HIGH (ref 3.10–9.50)
nRBC %: 0.1 /100 WBC — ABNORMAL HIGH (ref <=0.0)

## 2022-06-29 LAB — RENAL FUNCTION PANEL
Albumin: 2.5 g/dL — ABNORMAL LOW (ref 3.5–5.0)
Albumin: 2.3 g/dL — ABNORMAL LOW (ref 3.5–5.0)
Albumin: 2 g/dL — ABNORMAL LOW (ref 3.5–5.0)
Anion Gap: 15 (ref 5.0–15.0)
Anion Gap: 12 (ref 5.0–15.0)
Anion Gap: 9 (ref 5.0–15.0)
BUN: 28 mg/dL — ABNORMAL HIGH (ref 7–21)
BUN: 23 mg/dL — ABNORMAL HIGH (ref 7–21)
BUN: 21 mg/dL (ref 7–21)
CO2: 18 mEq/L (ref 17–29)
CO2: 19 mEq/L (ref 17–29)
CO2: 21 mEq/L (ref 17–29)
Calcium: 8.5 mg/dL (ref 7.9–10.2)
Calcium: 9.2 mg/dL (ref 7.9–10.2)
Calcium: 9.7 mg/dL (ref 7.9–10.2)
Chloride: 102 mEq/L (ref 99–111)
Chloride: 105 mEq/L (ref 99–111)
Chloride: 106 mEq/L (ref 99–111)
Creatinine: 6.9 mg/dL — ABNORMAL HIGH (ref 0.4–1.0)
Creatinine: 5.2 mg/dL — ABNORMAL HIGH (ref 0.4–1.0)
Creatinine: 4.6 mg/dL — ABNORMAL HIGH (ref 0.4–1.0)
GFR: 5.4 mL/min/{1.73_m2} — ABNORMAL LOW (ref >=60.0)
GFR: 7.6 mL/min/{1.73_m2} — ABNORMAL LOW (ref >=60.0)
GFR: 8.8 mL/min/{1.73_m2} — ABNORMAL LOW (ref >=60.0)
Glucose: 197 mg/dL — ABNORMAL HIGH (ref 70–100)
Glucose: 155 mg/dL — ABNORMAL HIGH (ref 70–100)
Glucose: 147 mg/dL — ABNORMAL HIGH (ref 70–100)
Phosphorus: 6.9 mg/dL — ABNORMAL HIGH (ref 2.3–4.7)
Phosphorus: 5.4 mg/dL — ABNORMAL HIGH (ref 2.3–4.7)
Phosphorus: 4.8 mg/dL — ABNORMAL HIGH (ref 2.3–4.7)
Potassium: 3.8 mEq/L (ref 3.5–5.3)
Potassium: 4.7 mEq/L (ref 3.5–5.3)
Potassium: 4.4 mEq/L (ref 3.5–5.3)
Sodium: 135 mEq/L (ref 135–145)
Sodium: 136 mEq/L (ref 135–145)
Sodium: 136 mEq/L (ref 135–145)

## 2022-06-29 LAB — ARTERIAL BLOOD GAS WITH CHEMISTRIES
Arterial Base Excess: -4.5 mEq/L — ABNORMAL LOW (ref <=2.7)
Arterial CO2: 18.6 mEq/L — ABNORMAL LOW (ref 23.0–30.0)
Arterial HCO3: 19.5 mEq/L — ABNORMAL LOW (ref 23.0–28.0)
Arterial O2 Saturation: 98.4 % — ABNORMAL HIGH (ref 94.0–98.0)
Arterial pCO2: 33.7 mmHg (ref 32.0–48.0)
Arterial pH: 7.38 (ref 7.350–7.450)
Arterial pO2: 132 mmHg — ABNORMAL HIGH (ref 83.0–108.0)
Calcium, Ionized: 2.34 mEq/L (ref 2.23–2.56)
Hemoglobin Total: 8.4 g/dL — ABNORMAL LOW (ref 12.0–16.0)
Temperature: 37 C
Whole Blood Chloride: 101 mEq/L (ref 98–107)
Whole Blood Lactic Acid: 1.8 mmol/L (ref 0.2–2.0)
Whole Blood Potassium: 3.6 mEq/L (ref 3.3–4.6)
Whole Blood Sodium: 134 mEq/L — ABNORMAL LOW (ref 136–145)

## 2022-06-29 LAB — WHOLE BLOOD GLUCOSE POCT
Whole Blood Glucose POCT: 297 mg/dL — ABNORMAL HIGH (ref 70–100)
Whole Blood Glucose POCT: 294 mg/dL — ABNORMAL HIGH (ref 70–100)
Whole Blood Glucose POCT: 283 mg/dL — ABNORMAL HIGH (ref 70–100)
Whole Blood Glucose POCT: 263 mg/dL — ABNORMAL HIGH (ref 70–100)
Whole Blood Glucose POCT: 225 mg/dL — ABNORMAL HIGH (ref 70–100)
Whole Blood Glucose POCT: 206 mg/dL — ABNORMAL HIGH (ref 70–100)
Whole Blood Glucose POCT: 194 mg/dL — ABNORMAL HIGH (ref 70–100)
Whole Blood Glucose POCT: 152 mg/dL — ABNORMAL HIGH (ref 70–100)
Whole Blood Glucose POCT: 165 mg/dL — ABNORMAL HIGH (ref 70–100)
Whole Blood Glucose POCT: 116 mg/dL — ABNORMAL HIGH (ref 70–100)
Whole Blood Glucose POCT: 135 mg/dL — ABNORMAL HIGH (ref 70–100)
Whole Blood Glucose POCT: 129 mg/dL — ABNORMAL HIGH (ref 70–100)
Whole Blood Glucose POCT: 131 mg/dL — ABNORMAL HIGH (ref 70–100)
Whole Blood Glucose POCT: 156 mg/dL — ABNORMAL HIGH (ref 70–100)
Whole Blood Glucose POCT: 154 mg/dL — ABNORMAL HIGH (ref 70–100)

## 2022-06-29 LAB — PT AND APTT
INR: 1.2 (ref 0.9–1.1)
INR: 1.1 (ref 0.9–1.1)
INR: 1.1 (ref 0.9–1.1)
INR: 1.1 (ref 0.9–1.1)
PT: 13.6 s — ABNORMAL HIGH (ref 10.1–12.9)
PT: 13 s — ABNORMAL HIGH (ref 10.1–12.9)
PT: 12.5 s (ref 10.1–12.9)
PT: 12.5 s (ref 10.1–12.9)
PTT: 27 s (ref 27–39)
PTT: 26 s — ABNORMAL LOW (ref 27–39)
PTT: 26 s — ABNORMAL LOW (ref 27–39)
PTT: 28 s (ref 27–39)

## 2022-06-29 LAB — ARTERIAL BLOOD GAS
Arterial Base Excess: -4.5 mEq/L — ABNORMAL LOW (ref <=2.7)
Arterial CO2: 18.6 mEq/L — ABNORMAL LOW (ref 23.0–30.0)
Arterial HCO3: 19.5 mEq/L — ABNORMAL LOW (ref 23.0–28.0)
Arterial O2 Saturation: 98.4 % — ABNORMAL HIGH (ref 94.0–98.0)
Arterial pCO2: 33.7 mmHg (ref 32.0–48.0)
Arterial pH: 7.38 (ref 7.350–7.450)
Arterial pO2: 132 mmHg — ABNORMAL HIGH (ref 83.0–108.0)
Temperature: 37 C

## 2022-06-29 LAB — CALCIUM, IONIZED
Calcium, Ionized: 2.16 mEq/L — ABNORMAL LOW (ref 2.23–2.56)
Calcium, Ionized: 2.24 mEq/L (ref 2.23–2.56)
Calcium, Ionized: 2.31 mEq/L (ref 2.23–2.56)
Calcium, Ionized: 2.66 mEq/L — ABNORMAL HIGH (ref 2.23–2.56)

## 2022-06-29 LAB — COMPREHENSIVE METABOLIC PANEL
ALT: 19 U/L (ref 0–55)
AST (SGOT): 38 U/L (ref 5–41)
Albumin/Globulin Ratio: 1.9 (ref 0.9–2.2)
Albumin: 2.9 g/dL — ABNORMAL LOW (ref 3.5–5.0)
Alkaline Phosphatase: 34 U/L — ABNORMAL LOW (ref 37–117)
Anion Gap: 14 (ref 5.0–15.0)
BUN: 27 mg/dL — ABNORMAL HIGH (ref 7–21)
Bilirubin, Total: 0.6 mg/dL (ref 0.2–1.2)
CO2: 18 mEq/L (ref 17–29)
Calcium: 8 mg/dL (ref 7.9–10.2)
Chloride: 102 mEq/L (ref 99–111)
Creatinine: 6.7 mg/dL — ABNORMAL HIGH (ref 0.4–1.0)
GFR: 5.6 mL/min/{1.73_m2} — ABNORMAL LOW (ref >=60.0)
Globulin: 1.5 g/dL — ABNORMAL LOW (ref 2.0–3.6)
Glucose: 338 mg/dL — ABNORMAL HIGH (ref 70–100)
Potassium: 4.2 mEq/L (ref 3.5–5.3)
Protein, Total: 4.4 g/dL — ABNORMAL LOW (ref 6.0–8.3)
Sodium: 134 mEq/L — ABNORMAL LOW (ref 135–145)

## 2022-06-29 LAB — THROMBOELASTOGRAPHY CLOTTING PROFILE
TEG Angle: 75.9 Degree (ref 55.0–78.0)
TEG CI: 4 — ABNORMAL HIGH (ref <=3.0)
TEG K Time: 0.9 min — ABNORMAL LOW (ref 1.0–3.0)
TEG MA: 71.7 mm — ABNORMAL HIGH (ref 51.0–69.0)
TEG R Time: 3.8 min (ref 2.0–8.0)

## 2022-06-29 LAB — TYPE AND SCREEN
ABO Rh: O POS
Antibody Screen: NEGATIVE

## 2022-06-29 LAB — MAGNESIUM
Magnesium: 2.4 mg/dL (ref 1.6–2.6)
Magnesium: 2.2 mg/dL (ref 1.6–2.6)
Magnesium: 2.2 mg/dL (ref 1.6–2.6)
Magnesium: 2.2 mg/dL (ref 1.6–2.6)

## 2022-06-29 LAB — PHOSPHORUS: Phosphorus: 7.1 mg/dL — ABNORMAL HIGH (ref 2.3–4.7)

## 2022-06-29 LAB — APTT: PTT: 26 s — ABNORMAL LOW (ref 27–39)

## 2022-06-29 LAB — PT/INR
INR: 1.1 (ref 0.9–1.1)
PT: 12.6 s (ref 10.1–12.9)

## 2022-06-29 LAB — TRIGLYCERIDES: Triglycerides: 150 mg/dL — ABNORMAL HIGH (ref 34–149)

## 2022-06-29 SURGERY — LAPAROTOMY, EXPLORATORY
Anesthesia: Anesthesia Choice

## 2022-06-29 MED ORDER — GLUCAGON 1 MG IJ SOLR (WRAP)
1.0000 mg | INTRAMUSCULAR | Status: DC | PRN
Start: 2022-06-29 — End: 2022-06-29

## 2022-06-29 MED ORDER — MAGNESIUM SULFATE IN D5W 1-5 GM/100ML-% IV SOLN
1.0000 g | INTRAVENOUS | Status: AC
Start: 2022-06-29 — End: 2022-06-29
  Administered 2022-06-29 (×2): 1 g via INTRAVENOUS
  Filled 2022-06-29: qty 100

## 2022-06-29 MED ORDER — SODIUM CHLORIDE (PF) 0.9 % IJ SOLN
10.0000 mL | INTRAMUSCULAR | Status: DC | PRN
Start: 2022-06-29 — End: 2022-07-03

## 2022-06-29 MED ORDER — PRISMASATE BGK 4/2.5 CRRT FLUID
Status: DC
Start: 2022-06-29 — End: 2022-07-03

## 2022-06-29 MED ORDER — INSULIN LISPRO 100 UNIT/ML SOLN (WRAP)
2.0000 [IU] | Status: DC
Start: 2022-06-29 — End: 2022-06-29
  Administered 2022-06-29: 6 [IU] via SUBCUTANEOUS
  Filled 2022-06-29: qty 18

## 2022-06-29 MED ORDER — INSULIN LISPRO 100 UNIT/ML SOLN (WRAP)
1.0000 [IU] | Status: DC
Start: 2022-06-30 — End: 2022-07-03
  Administered 2022-06-30 – 2022-07-02 (×4): 3 [IU] via SUBCUTANEOUS
  Administered 2022-07-02: 1 [IU] via SUBCUTANEOUS
  Administered 2022-07-02: 3 [IU] via SUBCUTANEOUS
  Administered 2022-07-02: 1 [IU] via SUBCUTANEOUS
  Administered 2022-07-03: 3 [IU] via SUBCUTANEOUS
  Filled 2022-06-29: qty 9
  Filled 2022-06-29: qty 3
  Filled 2022-06-29 (×2): qty 9
  Filled 2022-06-29: qty 3
  Filled 2022-06-29 (×3): qty 9

## 2022-06-29 MED ORDER — DEXTROSE 50 % IV SOLN
12.5000 g | INTRAVENOUS | Status: DC | PRN
Start: 2022-06-29 — End: 2022-08-18

## 2022-06-29 MED ORDER — INSULIN SYRINGES (DISPOSABLE) U-100 1 ML MISC
3.0000 [IU] | Freq: Once | Status: AC
Start: 2022-06-29 — End: 2022-06-29
  Administered 2022-06-29: 3 [IU] via INTRAVENOUS
  Filled 2022-06-29: qty 9

## 2022-06-29 MED ORDER — GLUCAGON 1 MG IJ SOLR (WRAP)
1.0000 mg | INTRAMUSCULAR | Status: DC | PRN
Start: 2022-06-29 — End: 2022-08-18

## 2022-06-29 MED ORDER — INSULIN LISPRO 100 UNIT/ML SOLN (WRAP)
1.0000 [IU] | Status: DC
Start: 2022-06-30 — End: 2022-06-29

## 2022-06-29 MED ORDER — INSULIN SYRINGES (DISPOSABLE) U-100 1 ML MISC
5.0000 [IU] | Freq: Once | Status: AC
Start: 2022-06-29 — End: 2022-06-29

## 2022-06-29 MED ORDER — MAGNESIUM SULFATE IN D5W 1-5 GM/100ML-% IV SOLN
1.0000 g | INTRAVENOUS | Status: DC | PRN
Start: 2022-06-29 — End: 2022-07-03

## 2022-06-29 MED ORDER — DEXTROSE 50 % IV SOLN
12.5000 g | INTRAVENOUS | Status: DC | PRN
Start: 2022-06-29 — End: 2022-06-29

## 2022-06-29 MED ORDER — SODIUM PHOSPHATES 3 MMOLE/ML IV SOLN (WRAP)
20.0000 mmol | INTRAVENOUS | Status: DC | PRN
Start: 2022-06-29 — End: 2022-07-03
  Filled 2022-06-29: qty 6.67

## 2022-06-29 MED ORDER — DEXTROSE 10 % IV BOLUS
12.5000 g | INTRAVENOUS | Status: DC | PRN
Start: 2022-06-29 — End: 2022-06-29

## 2022-06-29 MED ORDER — SODIUM CHLORIDE 0.9 % IV SOLN
0.0000 mL/h | INTRAVENOUS | Status: DC
Start: 2022-06-29 — End: 2022-07-03
  Administered 2022-06-29: 60 mL/h via INTRAVENOUS
  Administered 2022-06-30 – 2022-07-01 (×2): 40 mL/h via INTRAVENOUS
  Administered 2022-07-01: 50 mL/h via INTRAVENOUS
  Administered 2022-07-02: 60 mL/h via INTRAVENOUS
  Filled 2022-06-29 (×7): qty 100

## 2022-06-29 MED ORDER — SODIUM CHLORIDE 0.9 % IV SOLN
INTRAVENOUS | Status: DC | PRN
Start: 2022-06-29 — End: 2022-06-30

## 2022-06-29 MED ORDER — GLUCOSE 40 % PO GEL (WRAP)
15.0000 g | ORAL | Status: DC | PRN
Start: 2022-06-29 — End: 2022-08-18

## 2022-06-29 MED ORDER — ALBUMIN HUMAN/BIOSIMILIAR 5% IV SOLN (WRAP)
12.5000 g | Freq: Once | INTRAVENOUS | Status: AC
Start: 2022-06-29 — End: 2022-06-29
  Administered 2022-06-29: 12.5 g via INTRAVENOUS
  Filled 2022-06-29: qty 250

## 2022-06-29 MED ORDER — POTASSIUM CHLORIDE 10 MEQ/100ML IV SOLN (WRAP)
10.0000 meq | INTRAVENOUS | Status: DC | PRN
Start: 2022-06-29 — End: 2022-06-29
  Administered 2022-06-29: 10 meq via INTRAVENOUS
  Filled 2022-06-29: qty 100

## 2022-06-29 MED ORDER — POTASSIUM CHLORIDE 20 MEQ/50ML IV SOLN
20.0000 meq | INTRAVENOUS | Status: DC | PRN
Start: 2022-06-29 — End: 2022-07-03
  Administered 2022-06-29 – 2022-07-02 (×4): 20 meq via INTRAVENOUS
  Filled 2022-06-29 (×4): qty 50

## 2022-06-29 MED ORDER — SODIUM CHLORIDE 0.9 % IV MBP
4.5000 g | Freq: Three times a day (TID) | INTRAVENOUS | Status: DC
Start: 2022-06-29 — End: 2022-07-01
  Administered 2022-06-29 – 2022-07-01 (×5): 4.5 g via INTRAVENOUS
  Filled 2022-06-29 (×5): qty 20

## 2022-06-29 MED ORDER — INSULIN SYRINGES (DISPOSABLE) U-100 1 ML MISC
7.0000 [IU] | Freq: Once | Status: AC
Start: 2022-06-29 — End: 2022-06-29

## 2022-06-29 MED ORDER — INSULIN REGULAR 100 UNITS IN 100 ML NS (PREMIX)
0.0000 [IU]/h | INTRAVENOUS | Status: DC
Start: 2022-06-29 — End: 2022-06-29
  Administered 2022-06-29: 3 [IU]/h via INTRAVENOUS
  Filled 2022-06-29: qty 100

## 2022-06-29 MED ORDER — CALCIUM GLUCONATE-NACL 1-0.675 GM/50ML-% IV SOLN
1.0000 g | Freq: Once | INTRAVENOUS | Status: AC
Start: 2022-06-29 — End: 2022-06-29
  Administered 2022-06-29: 1 g via INTRAVENOUS
  Filled 2022-06-29: qty 50

## 2022-06-29 MED ORDER — DEXTROSE 10 % IV BOLUS
12.5000 g | INTRAVENOUS | Status: DC | PRN
Start: 2022-06-29 — End: 2022-08-18

## 2022-06-29 MED ORDER — GLUCOSE 40 % PO GEL (WRAP)
15.0000 g | ORAL | Status: DC | PRN
Start: 2022-06-29 — End: 2022-06-29

## 2022-06-29 NOTE — Plan of Care (Addendum)
Assumed care of pt at 1900  Pt on propofol and fentanyl,  titrated to maintain goal RASS 0 to -2,  will follow commands & nod to questions.  Pt in BUE soft wrist restraints to protect tubes & lines - with restlessness and reduction in sedation, patient will actively reach toward ETT.  HR 80-120s,  BP 80-130s - received 5%/12.5G albumin x2,  levophed on and titrated to maintain MAP >65 but tenuous.  Shelby Pert, PA - updated on current Hgb 7.1.  Continue on vent management, FiO2 weaned as tolerated - lungs coarse with small amount of secretions.  NPO, q4 blood sugar check and SSI algorithm changed to high dose.  Abdomen open with wound vac in place at - - serosanguinous drainage, no air leak detected.  Receiving IV zosyn per order.  Electrolytes replaced.  [name] updated by phone earlier in the shift - questions answered. Plan to return to OR for abdominal closure, time unknown but will call pt's daughter Shelby Barron when time is established.  Refer to flowsheets for full assessment  Tilden Dome, RN 5:24 AM    Blood sugars continue to be 290s, Insulin gtt started.    Tilden Dome, RN 6:50 AM    Problem: Non-Violent Restraints Interdisciplinary Plan  Goal: Will be injury free during the use of non-violent restraints  Outcome: Progressing  Flowsheets (Taken 06/28/2022 1937 by Joneen Roach, RN)  Will be injury free during the use of non-violent restraints:   Attempt all alternatives before use of restraints   Initiate least restrictive type of restraint that is effective   Provide and maintain safe environment   Notify family of initiation of restraints   Include patient/family/caregiver in decisions related to safety   Ensure safety devices are properly applied and maintained   Document observed patient actions according to protocol   Nurse to accompany patient off unit when on restraints   Remove restraints before the indicated maximum length of time when meets criteria for discontinuation   Document  significant changes in patient condition   Provide debriefing as soon as possible and appropriate   Reassess need for continued restraints   Ensure that order for restraints has not expired     Problem: Inadequate Airway Clearance  Goal: Patent Airway maintained  Outcome: Progressing  Flowsheets (Taken 06/28/2022 1937 by Joneen Roach, RN)  Patent airway maintained:   Position patient for maximum ventilatory efficiency   Provide adequate fluid intake to liquefy secretions   Reposition patient every 2 hours and as needed unless able to self-reposition   Suction secretions as needed     Problem: Hemodynamic Status: Cardiac  Goal: Stable vital signs and fluid balance  Outcome: Progressing  Flowsheets (Taken 06/28/2022 1937 by Joneen Roach, RN)  Stable vital signs and fluid balance:   Assess signs and symptoms associated with cardiac rhythm changes   Monitor lab values

## 2022-06-29 NOTE — Progress Notes (Cosign Needed)
4 eyes in 4 hours pressure injury assessment note:      Completed with:   Unit & Time admitted:              Bony Prominences: Check appropriate box; if wound is present enter wound assessment in LDA     Occiput:                 [x] WNL  []  Wound present  Face:                     [x] WNL  []  Wound present  Ears:                      [x] WNL  []  Wound present  Spine:                    [x] WNL  []  Wound present  Shoulders:             [x] WNL  []  Wound present  Elbows:                  [x] WNL  []  Wound present  Sacrum/coccyx:     [x] WNL  []  Wound present  Ischial Tuberosity:  [x] WNL  []  Wound present  Trochanter/Hip:      [x] WNL  []  Wound present  Knees:                   [x] WNL  []  Wound present  Ankles:                   [x] WNL  []  Wound present  Heels:                    [x] WNL  []  Wound present  Other pressure areas:  []  Wound location       Device related: [x]  Device name: surgical incision @ abdomen, open abdomen w/ wound vac.         LDA completed if wound present: yes/no  Consult WOCN if necessary    Other skin related issues, ie tears, rash, etc, document in Integumentary flowsheet

## 2022-06-29 NOTE — Progress Notes (Signed)
Nutritional Support Services  Nutrition Assessment    Shelby Barron 82 y.o. female   MRN: [id number]    Summary of Nutrition Recommendations:    1. Once clinically appropriate, consider initiating tube feeding of Promote @ 68ml/h, increase 10ml Q6 to goal rate of 35ml/hr x 24hrs/day   Provides 1440 kcals, 90 g protein, 1208 ml free H2O    2. Flush Prosource 1 pkt daily    Each packet provides 80 kcals and 20g protein     3. Flush tube with minimum of 30 ml Q 4 hours for tube patency- additional fluids for hydration per MD     Total EN regimen provides 1520 kcals, 110g protein, H2O  Meets 100% kcal/protein needs    -----------------------------------------------------------------------------------------------------------------    Patient discussed during rounds                                                         Assessment Data:   Referral Source: Census Review  Reason for Referral: Vent    Nutrition: Patient seen at bedside, intubated on vent with OGT. Propofol @ 5.2 ml/hr (135 kcal/d). MD reporting plan for pt to go back to the OR, no plans for feeding at this time. [name] at bedside, able to provide nutrition hx. Pt with decreased appetite/intake for multiple months but wsa focusing on protein and vegetable intake. Pt with significant decrease in PO intake x 2 weeks PTA, consuming small bites of food and 1-2 Glucerna shakes per day (<50% EER). Pt with significant abd pain over the past 2 weeks contributing to poor intake.    Learning Needs: education not appropriate d/t intubation    Hospital Admission: 82 yo female with PHMx COPD, CVA, DM, renal cell carcinoma s/p partial b/l nephrectomy, [name], new diagnosis of urothelial CA, presenting for scheduled cystectomy and complete nephrectomy. OR course c/b small bowel enterotomy and significant bleeding.     Medical Hx:  has a past medical history of Acid reflux, Asthma, well controlled, Cancer of kidney, Chronic lower back pain, Congestive  heart disease, COPD (chronic obstructive pulmonary disease), CVA (cerebral infarction) (01/12/2003), Diabetes, Diabetic nephropathy, Fibroids, Gout, H/O: gout, Hyperlipidemia, Hypertension, Neuropathy of hand, and SOB (shortness of breath).    PSH: has a past surgical history that includes Hernia repair; APPENDECTOMY (OPEN); Hysterectomy; tumor removed from kidney Marletta Lor 2015); Partial nephrectomy; TURBT; Drain (Other) (N/A, 05/31/2022); Tunneled Cath Placement (Permcath) (N/A, 06/01/2022); CYSTECTOMY, ILEOCONDUIT (N/A, 06/28/2022); LAPAROTOMY, NEPHRECTOMY (Bilateral, 06/28/2022); EXPLORATORY LAPAROTOMY, RESECTION SMALL BOWEL (N/A, 06/28/2022); WOUND VAC APPLICATION (N/A, 06/28/2022); and CLOSURE, ENTEROTOMY, SMALL INTESTINE (06/28/2022).     Orders Placed This Encounter   Procedures    Diet NPO effective now         ANTHROPOMETRIC  Height: 160 cm (5' 2.99")  Weight: 91.3 kg (201 lb 4.5 oz)  Weight Change: 4.83  Body mass index is 35.66 kg/m.      Weight Monitoring     Weight Weight Method   05/26/2022 88.451 kg  Stated     88.5 kg  Standing Scale    06/23/2022 84.823 kg     06/25/2022 87.091 kg     06/28/2022 87.09 kg  Standing Scale    06/29/2022 91.3 kg  Bed Scale         Weight History Summary:  Pt was reportedly weighing ~93kg in late April, with weight loss down to ~84kg. Charted weights not consistent with reported weight hx. Will continue to monitor as able.        ESTIMATED NEEDS    Total Daily Energy Needs: 1305 to 1740 kcal  Method for Calculating Energy Needs: 15 kcal - 20 kcal per kg  at 87 kg (Actual body weight)  Rationale: obese, critical illness, CRRT       Total Daily Protein Needs: 104.4 to 130.5 g  Method for Calculating Protein Needs: 2 g - 2.5 g per kg at 52.2 kg (Ideal body weight)  Rationale: obese, critical illness, CRRT      Total Daily Fluid Needs: 1305 to 1740 ml  Method for Calculating Fluid Needs: 1 ml per kcal energy = 1305 to 1740 kcal  Rationale: bmi, age      Pertinent Medications:  SSI med  dose, zosyn    IVF:     calcium GLUConate CRRT infusion 50 mL/hr (06/29/22 2300)    fentaNYL 50 mcg/hr (06/29/22 2300)    norepinephrine Stopped (06/29/22 1819)    prismaSATE 4/2.5 1,500 mL/hr at 06/29/22 1811    prismaSATE 4/2.5 1,000 mL/hr at 06/29/22 2005    propofol 10 mcg/kg/min (06/29/22 2300)       Allergies   Allergen Reactions    Metrizamide Shortness Of Breath and Nausea Only    Rosuvastatin Other (See Comments)     myopathy    myopathy   myopathy    myopathy myopathy      myopathy    myopathy, myopathy    Iodinated Contrast Media Other (See Comments)     Nausea and SOB per patient   On Dialysisi    Nausea and SOB per patient    Nausea and SOB per patient      Nausea and SOB per patient   Nausea and SOB per patient    Nausea and SOB per patient      Nausea and SOB per patient Nausea and SOB per patient      Nausea and SOB per patient Nausea and SOB per patient Nausea and SOB per patient      Nausea and SOB per patient  Nausea and SOB per patient Nausea and SOB per patient    Nausea and SOB per patient   Nausea and SOB per patient    Pioglitazone      Weight gain    Sulfa Antibiotics Hives     blister         Pertinent labs:  Recent Labs   Lab 06/29/22  1841 06/29/22  1135 06/29/22  0406 06/28/22  2250 06/28/22  2010 06/28/22  1645   Sodium 136 135 134* 136 136 138   Potassium 4.7 3.8 4.2 4.2 4.6 4.5   Chloride 105 102 102 103 105 105   CO2 19 18 18  15* 15* 20   BUN 23* 28* 27* 26* 25* 23*   Creatinine 5.2* 6.9* 6.7* 6.5* 6.4* 6.2*   Glucose 155* 197* 338* 324* 332* 274*   Calcium 9.2 8.5 8.0 7.9 7.4* 7.8*   Magnesium 2.2 2.2 2.4 1.6 1.7 1.7   Phosphorus 5.4* 6.9* 7.1* 8.2* 8.5* 8.4*   GFR 7.6* 5.4* 5.6* 5.8* 6.0* 6.2*   Triglycerides  --   --   --   --   --  150*         Physical Assessment: June 29, 2022 Limitations include pt unable to  participate, intubation equipment  Head: temple region: slight depression with decrease in muscle tone/resistance (mild muscle loss - temporalis) and orbital region:  slightly dark circles, somewhat hollow look, some decrease in bounce back of fat pads (mild fat loss)  Upper Body: clavicle bone region: clavicle may be visible, but not prominent, feel muscle tone/resistance (no wasting observed), shoulder and acromion bone region: slight protrusion of acromion process, decrease in muscle tone/resistance (mild muscle loss - deltoid), upper arm region: ample fat tissue between fingers (no wasting observed), and dorsal hand region: muscle may bulge or be flat, no depression, feel muscle tone/resistance (no wasting observed)  Lower Body: no s/s subcutaneous fat or muscle loss  Edema: edema: no sign of fluid accumulation   Skin: abd surgical incision per flow sheet  GI function:  abd rounded, tender                                                                Nutrition Diagnosis        Moderate Malnutrition related to inadequate nutritional intake in the setting of acute illness as evidenced by <50% EER > 5 days, mild muscle losses (temporalis, deltoid) - new                                                                  Intervention     Nutrition recommendation - Please refer to top of note                                                                  Monitoring/Evaluation     Goals:     1. Patient will meet >80% of nutritional needs via tube feeding by next RD follow up  - new        Nutrition Risk Level: High (will follow up at least 2 times per week and PRN)      Baxter Hire, RD, CNSC  Clinical Dietitian  [phone]

## 2022-06-29 NOTE — Progress Notes (Signed)
[facility]  Progress Note      Patient Name: Shelby Barron  MRN: [id number]  Room: V4098/J1914-78    Reason for Admission: Bladder cancer  ICU Day: 20h    Overview   TAREA RIDDER is a 82 y.o. female who presents with history of PD, CVA, type 2 diabetes presented for elective cystectomy and completion nephrectomy.  OR complicated by small bowel enterotomy and hemorrhagic shock.      Subjective   Hospital Course:  6/10: Admitted - OR for cystectomy. Left abdomen open in discontinuity     Objective   Physical Examination     Last vent settings if on vent:  FiO2:  [29 %-100 %] 30 %  S RR:  [15-22] 22  S VT:  [400 mL] 400 mL  PEEP/EPAP:  [5 cm H20-8 cm H20] 5 cm H20    Vitals Temp:  [96.8 F (36 C)-99.2 F (37.3 C)]   Heart Rate:  [72-121]   Resp Rate:  [0-36]   BP: (70-155)/(40-96)   Arterial Line BP: (72-161)/(32-93)   SpO2:  [90 %-100 %]   Height:  [160 cm (5' 2.99")]   Weight:  [91.3 kg (201 lb 4.5 oz)]   BMI (calculated):  [35.7]  // Temperature with 24 range    Input / Output:   Intake/Output Summary (Last 24 hours) at 06/29/2022 1225  Last data filed at 06/29/2022 1200  Gross per 24 hour   Intake 5114.4 ml   Output 1500 ml   Net 3614.4 ml       General:     Neuro:    intubated and sedated    Lungs:   clear to auscultation, no wheezes, rales or rhonchi, symmetric air entry     Cardiac:   normal rate, regular rhythm, normal S1, S2, no murmurs, rubs, clicks or gallops     Abdomen:    Abthera in place     Extremities:   peripheral pulses normal    Skin:     Warm, dry and intact      MCCSU Checklist  Sedation:   CAM-ICU: Positive or Negative for Delirium: Negative   CAM ICU:   Last Documented RASS: RASS Score: Drowsy   Currently ordered infusions:    sodium chloride 25 mL/hr at 06/29/22 0900    calcium GLUConate CRRT infusion      fentaNYL 50 mcg/hr (06/29/22 1200)    insulin regular 3 Units/hr (06/29/22 1200)    norepinephrine 10 mcg/min (06/29/22 1200)    prismaSATE 4/2.5      prismaSATE 4/2.5       propofol 10 mcg/kg/min (06/29/22 1200)    Reviewed: Yes   Mobility:   Current Mobility Level:   PMP Activity: Step 1 - Bedrest (06/29/2022 12:00 PM)    Current PT Order: No  Current OT Order: No Reviewed: Yes   Respiratory (n/a if blank):   Ventilator Time: 20h   Last Recorded Vent Mode:  FiO2:  [29 %-100 %] 30 %  S RR:  [15-22] 22  S VT:  [400 mL] 400 mL  PEEP/EPAP:  [5 cm H20-8 cm H20] 5 cm H20 Reviewed: Yes   Gastrointenstinal  Last Bowel Movement:   Last BM Date: 06/03/22 Reviewed: Yes   CAUTI Prevention (n/a if blank):  Foley Day:        Reviewed: Yes   Blood Steam Infection Prevention (n/a if blank):   Permacath Catheter - Tunneled 06/01/22 Internal Jugular RightPeripheral Arterial Line 06/28/22  Right RadialCVC Triple Lumen 06/28/22 Right Femoral Reviewed: Yes   DVT Chemoprophylaxis (none if blank):    Reviewed: Yes       Assessment     Active Problems:   #       Patient has BMI=Body mass index is 35.66 kg/m.  Diagnosis: No additional diagnosis based on BMI criteria      Recent Labs     06/29/22  1135 06/29/22  0904 06/29/22  0406 06/28/22  2010 06/28/22  1645 06/28/22  1511 06/28/22  1341   Platelet Count 138* 147 136* 144 146 127* 162     Diagnosis: No additional diagnosis           Recent Labs     06/29/22  1135 06/29/22  0406 06/28/22  2250 06/28/22  2010 06/28/22  1645 06/28/22  1341 06/28/22  0702   Sodium 135 134* 136 136 138 138 137     Diagnosis: No additional diagnosis       Recent Labs     06/29/22  1135 06/29/22  0406 06/28/22  2250 06/28/22  2010 06/28/22  1645 06/28/22  1341 06/28/22  0702   Potassium 3.8 4.2 4.2 4.6 4.5 3.6 3.4*     Diagnosis: No additional diagnosis         Recent Labs   Lab 06/29/22  1135 06/29/22  0904 06/29/22  0406   Hemoglobin 8.5* 7.4* 7.1*   Hematocrit 25.4* 22.0* 21.0*   MCV 89.1 89.8 90.9   WBC 18.82* 21.92* 20.78*   Platelet Count 138* 147 136*     Recent Labs   Lab 06/02/22  0514   Iron 62   TIBC 145*   Iron Saturation 43     Anemia Diagnosis: No new diagnosis               Plan   NEUROLOGICAL:   #History of CVA  Hold home plavix  Sedated with propofol/fentanyl  Titrating for RASS of -2     CARDIOVASCULAR:   #Mixed shock - hemorrhagic vs septic  Had EBL approximately 1 L in the OR also with small bowel enterotomy  Transfused 3 units PRBC in OR  Levophed gtt. for BP support titrating for MAP greater than 65  CBC, coags every 6 hours  Will resuscitate as indicated  Bleeding seems to have abated but will give 1 dose of DDAVP for uremic bleeding  Had TTE as part of preop cardiac clearance  Mild aortic stenosis (AVA 1.7), grade 1 diastolic dysfunction, normal LV/RV systolic function  Transfused 1U PRBC this morning  Some of persistent levophed requirement is owing to calcified radial artery (where art line is) - will titrate to cuff pressures     PULMONARY:   #Acute hypoxemic respiratory failure  #COPD  Adjusted ventilator targeting lung protective ventilation strategy (Tidal volume 4-8 cc/kg, limiting plateau pressure < 30 ccH2O)  Will plan to keep intubated until return to the OR     RENAL:   #ESRD  On HD Tuesday Thursday Saturday  Last dialysis was 6/8  Nephrology consulted  Plan to start CRRT today - will need significant volume removal before extubation     GASTROINTESTINAL:  #S/P Cystectomy and completion nephrectomy  POD1 from cystectomy and nephrectomy complicated by small bowel enterotomy and hemorrhagic shock  Resuscitation as above  Patient in discontinuity with open abdomen, Plan for OR today for closure if reasonably hemodynamically stable  Will cover with Zosyn for any intra-abdominal spillage secondary to  enterotomy     INFECTIOUS DISEASE:   Zosyn for intra-abdominal spillage     HEMATOLOGY/ONCOLOGY:   #Renal cell carcinoma  #Urothelial carcinoma  - VTE ppx: SCDs alone given bleeding     ENDOCRONOLOGY/RHEUMATOLOGY:   #Diabetes Mellitus type 2  Insulin gtt per critical care protocol    Advance Care Planning: Open ACP Navigator  Code Status: Full Code  Next of Kin:   Primary Emergency Contact: Hapke,Nicole           ADVANCE CARE PLAN                 This patient is critically ill with life-threatening condition(s) and a high probability of sudden clinically significant deterioration due to the condition(s) noted in the assessment and plan, which requires the highest level of physician/advance practice provider preparedness to intervene urgently. Full attention to the direct care of this patient was provided for the period of time noted below. Any critical care time performed today is exclusive of teaching and billable procedures and not overlapping with any other physicians or advance practice providers.    I have personally assessed the patient and based my assessment and medical decision-making on a review of the patient's history and 24-hour interval events along with medical records, physical examination, vital signs, analysis of recent laboratory results, evaluation of radiology images, monitoring data for potential decompensation, and additional findings found in detail within ICU team notes. The findings and plan of care was discussed with the care team.    Total critical care time: 41 minutes during this encounter.    Velia Meyer, MD   06/29/2022 12:30 PM

## 2022-06-29 NOTE — Plan of Care (Signed)
Neuro: PT on cont sedation of propofol gtt,  RASS: +1 to -1, able to follow command, can  move all extremities, Prn fentanyl bolus given for pain along w/ cont fentanyl gtt.  Pt can nod and gesture appropriately. Gag and cough present. PERRLA.   Restraint has been cont applied as pt is on high risk of removing life sustaining equipment. Pt's education, documentation,  and care plan has been done accordingly.   CV: Cont on levo gtt for MAP goal >65 on cuff pressure, intact R radial A line. NSR w/ HR 70's to 80's. Palpable pulses. Q6h of CBC, Pt and APTT done. 1 unit of PRBC given for Hbg:7.1, pt tolerated fine. Recheck Hbg: 8.4.   @1819 : Levophed has been stopped.   Resp: Ventilator assisted, w/ ET tube  in place, small inline and oral secretion, diminished B/L lung sound.   GI: surgical open abdomen w/ wound vac. Intact, total output from wounvac: 550 ml. No BM this shift, was on insulin gtt w/ ICU protocol, and q1h BG check, stopped insulin gtt @1415 . NPO. OG in place, and clamped. CRE swab done.   Renal: anuric, started on CRRT @ 0300, no heparin or citrate, net 0.  Pt tolerating fine. Electrolyte has been replaced as per the protocol. Q6h of lab and cont infusion of calcium gluconate.   Skin: surgical incision and open abdomen w/ wound vac.   Plan: possible surgery tomorrow to close abdomen.    Problem: Moderate/High Fall Risk Score >5  Goal: Patient will remain free of falls  06/29/2022 1023 by Luz Brazen, RN  Outcome: Progressing  Flowsheets (Taken 06/29/2022 1000)  High (Greater than 13):   HIGH-Visual cue at entrance to patient's room   HIGH-Bed alarm on at all times while patient in bed   HIGH-Utilize chair pad alarm for patient while in the chair   HIGH-Apply yellow "Fall Risk" arm band   HIGH-Initiate use of floor mats as appropriate   HIGH-Consider use of low bed   HIGH-Pharmacy to initiate evaluation and intervention per protocol  06/29/2022 0919 by Luz Brazen, RN  Outcome: Progressing  Flowsheets  (Taken 06/29/2022 0919)  High (Greater than 13):   HIGH-Utilize chair pad alarm for patient while in the chair   HIGH-Visual cue at entrance to patient's room   HIGH-Apply yellow "Fall Risk" arm band   HIGH-Bed alarm on at all times while patient in bed   HIGH-Initiate use of floor mats as appropriate     Problem: Non-Violent Restraints Interdisciplinary Plan  Goal: Will be injury free during the use of non-violent restraints  06/29/2022 1023 by Luz Brazen, RN  Outcome: Progressing  Flowsheets (Taken 06/29/2022 1023)  Will be injury free during the use of non-violent restraints:   Attempt all alternatives before use of restraints   Provide and maintain safe environment   Include patient/family/caregiver in decisions related to safety   Document observed patient actions according to protocol   Remove restraints before the indicated maximum length of time when meets criteria for discontinuation   Document significant changes in patient condition   Reassess need for continued restraints   Provide debriefing as soon as possible and appropriate   Ensure that order for restraints has not expired   Nurse to accompany patient off unit when on restraints   Ensure safety devices are properly applied and maintained   Notify family of initiation of restraints   Initiate least restrictive type of restraint that is effective  06/29/2022 0919 by Durward Mallard,  Lovett Calender, RN  Outcome: Progressing     Problem: Compromised Sensory Perception  Goal: Sensory Perception Interventions  06/29/2022 1023 by Luz Brazen, RN  Outcome: Progressing  Flowsheets (Taken 06/29/2022 1023)  Sensory Perception Interventions: Offload heels, Pad bony prominences, Reposition q 2hrs/turn Clock, Q2 hour skin assessment under devices if present  06/29/2022 0919 by Luz Brazen, RN  Outcome: Progressing     Problem: Compromised Moisture  Goal: Moisture level Interventions  06/29/2022 1023 by Luz Brazen, RN  Outcome: Progressing  Flowsheets (Taken 06/29/2022  0800)  Moisture level Interventions: Moisture wicking products, Moisture barrier cream  06/29/2022 0919 by Luz Brazen, RN  Outcome: Progressing     Problem: Compromised Activity/Mobility  Goal: Activity/Mobility Interventions  06/29/2022 1023 by Luz Brazen, RN  Outcome: Progressing  Flowsheets (Taken 06/29/2022 0800)  Activity/Mobility Interventions: Pad bony prominences, TAP Seated positioning system when OOB, Promote PMP, Reposition q 2 hrs / turn clock, Offload heels  06/29/2022 0919 by Luz Brazen, RN  Outcome: Progressing     Problem: Compromised Nutrition  Goal: Nutrition Interventions  06/29/2022 1023 by Luz Brazen, RN  Outcome: Progressing  Flowsheets (Taken 06/29/2022 0800)  Nutrition Interventions: Discuss nutrition at Rounds, I&Os, Document % meal eaten, Daily weights  06/29/2022 0919 by Luz Brazen, RN  Outcome: Progressing     Problem: Compromised Friction/Shear  Goal: Friction and Shear Interventions  06/29/2022 1023 by Luz Brazen, RN  Outcome: Progressing  Flowsheets (Taken 06/29/2022 0800)  Friction and Shear Interventions: Pad bony prominences, Off load heels, HOB 30 degrees or less unless contraindicated, Consider: TAP seated positioning, Heel foams  06/29/2022 0919 by Luz Brazen, RN  Outcome: Progressing     Problem: SCIP  Goal: SCIP measures are followed  06/29/2022 1023 by Luz Brazen, RN  Outcome: Progressing  Flowsheets (Taken 06/29/2022 1023)  SCIP measures are followed:   Administer antibiotics as ordered   VTE Prevention: Administer anticoagulant(s) and/or apply anti-embolism stockings/devices as ordered  06/29/2022 0919 by Luz Brazen, RN  Outcome: Progressing     Problem: Inadequate Airway Clearance  Goal: Patent Airway maintained  06/29/2022 1023 by Luz Brazen, RN  Outcome: Progressing  Flowsheets (Taken 06/29/2022 1023)  Patent airway maintained:   Position patient for maximum ventilatory efficiency   Reposition patient every 2 hours and as needed unless  able to self-reposition   Provide adequate fluid intake to liquefy secretions   Suction secretions as needed  06/29/2022 0919 by Luz Brazen, RN  Outcome: Progressing  Goal: Normal respiratory rate/effort achieved/maintained  06/29/2022 1023 by Luz Brazen, RN  Outcome: Progressing  Flowsheets (Taken 06/29/2022 1023)  Normal respiratory rate/effort achieved/maintained: Plan activities to conserve energy: plan rest periods  06/29/2022 0919 by Luz Brazen, RN  Outcome: Progressing     Problem: Infection Prevention  Goal: Free from infection  06/29/2022 1023 by Luz Brazen, RN  Outcome: Progressing  Flowsheets (Taken 06/29/2022 1023)  Free from infection:   Monitor/assess vital signs   Assess incision for evidence of healing   Monitor/assess lab values and report abnormal values   Encourage/assist patient to turn, cough and perform deep breathing every 2 hours   Monitor/assess output from surgical drain if present   Assess for signs and symptoms of infection  06/29/2022 0919 by Luz Brazen, RN  Outcome: Progressing     Problem: Impaired Mobility  Goal: Mobility/Activity is maintained at optimal level for patient  06/29/2022 1023 by Luz Brazen, RN  Outcome: Progressing  Flowsheets (Taken 06/29/2022 1023)  Mobility/activity is maintained at optimal  level for patient:   Encourage independent activity per ability   Consult/collaborate with Physical Therapy and/or Occupational Therapy  06/29/2022 0919 by Luz Brazen, RN  Outcome: Progressing     Problem: Constipation  Goal: Fluid and electrolyte balance are achieved/maintained  06/29/2022 1023 by Luz Brazen, RN  Outcome: Progressing  Flowsheets (Taken 06/29/2022 1023)  Fluid and electrolyte balance are achieved/maintained:   Monitor/assess lab values and report abnormal values   Monitor for muscle weakness   Assess and reassess fluid and electrolyte status   Observe for cardiac arrhythmias  06/29/2022 0919 by Luz Brazen, RN  Outcome:  Progressing  Goal: Elimination patterns are normal or improving  06/29/2022 1023 by Luz Brazen, RN  Outcome: Progressing  06/29/2022 0919 by Luz Brazen, RN  Outcome: Progressing     Problem: Safety  Goal: Patient will be free from injury during hospitalization  06/29/2022 1023 by Luz Brazen, RN  Outcome: Progressing  Flowsheets (Taken 06/29/2022 1023)  Patient will be free from injury during hospitalization:   Assess patient's risk for falls and implement fall prevention plan of care per policy   Use appropriate transfer methods   Include patient/ family/ care giver in decisions related to safety   Assess for patients risk for elopement and implement Elopement Risk Plan per policy   Provide alternative method of communication if needed (communication boards, writing)   Hourly rounding   Ensure appropriate safety devices are available at the bedside   Provide and maintain safe environment  06/29/2022 0919 by Luz Brazen, RN  Outcome: Progressing  Goal: Patient will be free from infection during hospitalization  06/29/2022 1023 by Luz Brazen, RN  Outcome: Progressing  Flowsheets (Taken 06/29/2022 1023)  Free from Infection during hospitalization:   Assess and monitor for signs and symptoms of infection   Monitor all insertion sites (i.e. indwelling lines, tubes, urinary catheters, and drains)   Encourage patient and family to use good hand hygiene technique   Monitor lab/diagnostic results  06/29/2022 0919 by Luz Brazen, RN  Outcome: Progressing     Problem: Pain  Goal: Pain at adequate level as identified by patient  06/29/2022 1023 by Luz Brazen, RN  Outcome: Progressing  Flowsheets (Taken 06/29/2022 0800)  Pain at adequate level as identified by patient:   Offer non-pharmacological pain management interventions   Reassess pain within 30-60 minutes of any procedure/intervention, per Pain Assessment, Intervention, Reassessment (AIR) Cycle  06/29/2022 0919 by Luz Brazen, RN  Outcome:  Progressing     Problem: Side Effects from Pain Analgesia  Goal: Patient will experience minimal side effects of analgesic therapy  06/29/2022 1023 by Luz Brazen, RN  Outcome: Progressing  Flowsheets (Taken 06/29/2022 1023)  Patient will experience minimal side effects of analgesic therapy:   Monitor/assess patient's respiratory status (RR depth, effort, breath sounds)   Prevent/manage side effects per LIP orders (i.e. nausea, vomiting, pruritus, constipation, urinary retention, etc.)   Assess for changes in cognitive function   Evaluate for opioid-induced sedation with appropriate assessment tool (i.e. POSS)  06/29/2022 0919 by Luz Brazen, RN  Outcome: Progressing     Problem: Hemodynamic Status: Cardiac  Goal: Stable vital signs and fluid balance  06/29/2022 1023 by Luz Brazen, RN  Outcome: Progressing  Flowsheets (Taken 06/29/2022 1023)  Stable vital signs and fluid balance:   Assess signs and symptoms associated with cardiac rhythm changes   Monitor lab values  06/29/2022 0919 by Luz Brazen, RN  Outcome: Progressing     Problem: Renal Instability  Goal: Fluid and electrolyte balance are achieved/maintained  06/29/2022 1023 by Luz Brazen, RN  Outcome: Progressing  Flowsheets (Taken 06/29/2022 1023)  Fluid and electrolyte balance are achieved/maintained:   Monitor/assess lab values and report abnormal values   Monitor for muscle weakness   Follow fluid restrictions/IV/PO parameters   Assess and reassess fluid and electrolyte status   Observe for cardiac arrhythmias  06/29/2022 0919 by Luz Brazen, RN  Outcome: Progressing     Problem: Patient Receiving Advanced Renal Therapies  Goal: Therapy access site remains intact  06/29/2022 1023 by Luz Brazen, RN  Outcome: Progressing  Flowsheets (Taken 06/29/2022 1023)  Therapy access site remains intact: Assess therapy access site  06/29/2022 0919 by Luz Brazen, RN  Outcome: Progressing     Problem: Fluid and Electrolyte Imbalance/  Endocrine  Goal: Fluid and electrolyte balance are achieved/maintained  06/29/2022 1023 by Luz Brazen, RN  Outcome: Progressing  Flowsheets (Taken 06/29/2022 1023)  Fluid and electrolyte balance are achieved/maintained:   Monitor/assess lab values and report abnormal values   Monitor for muscle weakness   Assess and reassess fluid and electrolyte status   Observe for cardiac arrhythmias  06/29/2022 0919 by Luz Brazen, RN  Outcome: Progressing  Goal: Adequate hydration  06/29/2022 1023 by Luz Brazen, RN  Outcome: Progressing  Flowsheets (Taken 06/29/2022 1023)  Adequate hydration:   Assess mucus membranes, skin color, turgor, perfusion and presence of edema   Assess for peripheral, sacral, periorbital and abdominal edema   Monitor and assess vital signs and perfusion  06/29/2022 0919 by Luz Brazen, RN  Outcome: Progressing     Problem: Diabetes: Glucose Imbalance  Goal: Blood glucose stable at established goal  06/29/2022 1023 by Luz Brazen, RN  Outcome: Progressing  Flowsheets (Taken 06/29/2022 1023)  Blood glucose stable at established goal:   Monitor lab values   Monitor intake and output.  Notify LIP if urine output is < 30 mL/hour.   Include patient/family in decisions related to nutrition/dietary selections   Monitor/assess vital signs   Ensure adequate hydration   Coordinate medication administration with meals, as indicated   Assess for hypoglycemia /hyperglycemia   Follow fluid restrictions/IV/PO parameters   Ensure appropriate diet and assess tolerance   Ensure patient/family has adequate teaching materials  06/29/2022 0919 by Luz Brazen, RN  Outcome: Progressing     Problem: Inadequate Gas Exchange  Goal: Patent Airway maintained  06/29/2022 1023 by Luz Brazen, RN  Outcome: Progressing  Flowsheets (Taken 06/29/2022 1023)  Patent airway maintained:   Position patient for maximum ventilatory efficiency   Reposition patient every 2 hours and as needed unless able to self-reposition    Provide adequate fluid intake to liquefy secretions   Suction secretions as needed  06/29/2022 0919 by Luz Brazen, RN  Outcome: Progressing  Goal: Adequate oxygenation and improved ventilation  06/29/2022 1023 by Luz Brazen, RN  Outcome: Progressing  Flowsheets (Taken 06/29/2022 1023)  Adequate oxygenation and improved ventilation:   Assess lung sounds   Monitor SpO2 and treat as needed   Provide mechanical and oxygen support to facilitate gas exchange   Teach/reinforce use of incentive spirometer 10 times per hour while awake, cough and deep breath as needed   Plan activities to conserve energy: plan rest periods   Increase activity as tolerated/progressive mobility   Position for maximum ventilatory efficiency   Monitor and treat ETCO2   Consult/collaborate with Respiratory Therapy  06/29/2022 0919 by Luz Brazen, RN  Outcome: Progressing     Problem:  Artificial Airway  Goal: Endotracheal tube will be maintained  06/29/2022 1023 by Luz Brazen, RN  Outcome: Progressing  Flowsheets (Taken 06/29/2022 1023)  Endotracheal  tube will be maintained:   Suction secretions as needed   Perform deep oropharyngeal suctioning at least every 4 hours   Maintain and assess integrity of ETT securing device   Keep head of bed at 30 degrees, unless contraindicated   Apply water-based moisturizer to lips   Support ventilator tubing to avoid pressure from drag of tubing   Utilize ETT securing device   Encourage/perform oral hygiene as appropriate  06/29/2022 0919 by Luz Brazen, RN  Outcome: Progressing

## 2022-06-29 NOTE — Consults (Signed)
[facility]  CONSULT  Shelby Barron, x [phone] Shelby Barron Spectralink)      Date Time: 06/29/22 10:09 AM  Patient Name: Shelby Barron  Requesting Physician: Velia Meyer, MD  Consulting Physician: Delice Bison, MD, MD    Primary Care Physician: Patsy Lager, MD    Reason for Consultation: ESRD, provision of dialysis       Assessment:     Patient Active Problem List   Diagnosis    Type 2 diabetes mellitus with kidney complication, with long-term current use of insulin    Diabetes mellitus type II, uncontrolled    Neuropathy of hand    Left renal mass    Hamstring injury    Peritonitis    ESRD (end stage renal disease)    Chronic diastolic congestive heart failure    Aortic atherosclerosis    Malignant neoplasm of urinary bladder, unspecified site    Malignant tumor of kidney, left    Benign essential hypertension    GERD (gastroesophageal reflux disease)    Leukocytosis    Bladder cancer     ESRD initially maintained on CCPD (catheter placed in [place]) - converted to HD in May 2024 due to inability to reposition catheter into pelvis   - now being dialyzed at [facility]  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 s/p bilateral radical nephrectomies and  cystectomy on 06/28/22  - complicated by small bowel enterotomy and blood loss - patient left in discontinuity   - plan to RTOR today for closure   Hypotension post op - on levophed  RCC s/p partial nephrectomy  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 (approx 1L) - hgb below goal - transfuse as needed  Hyponatremia - mild - due to hyperglycemia  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  Secondary hyperPTH of renal origin      Recommendations:     Will initiate CRRT  Agree with pRBC  Phos binders when taking PO  If hemodynamically stable, will plan to transition to HD tomorrow    Plan discussed with Shelby Barron, Shelby Barron daughter, and ICU team     Velia Meyer, MD, thank you for this consultation.  We will follow the patient with you  during this hospitalization.  Please contact me with any questions or issues.    Signed by: Delice Bison, MD, MD  [facility]  [phone] (office)  x [phone] [facility])      -----------------------------------------------------------------------------------------------------------        History of Presenting Illness:   Shelby Barron is a 82 y.o. female with ESRD initially maintained on PD transitioned to HD in May 2024 due to inability to reposition the PD catheter in the setting of low drains, RCC s/p partial nephrectomy, high grade urothelial bladder CA who presented to the hospital for bilateral nephrectomies and cystectomy which happened on 06/28/22 which OP course complicated by small bowel enterotomy and blood loss (~1L). Nephrology consulted for provision of dialysis.     Patient seen and examined this AM.   Plan for RTOR this afternoon given that pt was left in discontinuity due to small bowel enterotomy and blood loss. She is still requiring levophed, MAP>55 at this time.     Past Medical History:     Past Medical History:   Diagnosis Date    Acid reflux     Asthma, well controlled     Cancer of kidney     Chronic lower back pain  Congestive heart disease     COPD (chronic obstructive pulmonary disease)     CVA (cerebral infarction) 01/12/2003    Diabetes     Diabetic nephropathy     Fibroids     Gout     H/O: gout     Hyperlipidemia     Hypertension     Neuropathy of hand     SOB (shortness of breath)          Past Surgical History:     Past Surgical History:   Procedure Laterality Date    APPENDECTOMY (OPEN)      DRAIN (OTHER) N/A 05/31/2022    Procedure: DRAIN (OTHER);  Surgeon: Hope Pigeon, MD;  Location: [facility];  Service: Interventional Radiology;  Laterality: N/A;    HERNIA REPAIR      HYSTERECTOMY      PARTIAL NEPHRECTOMY      tumor removed from kidney  cJanuary 2015    TUNNELED CATH PLACEMENT (PERMCATH) N/A 06/01/2022    Procedure: TUNNELED CATH PLACEMENT;   Surgeon: Suszanne Finch, MD;  Location: [facility];  Service: Interventional Radiology;  Laterality: N/A;    TURBT         Family History:     Family History   Problem Relation Age of Onset    Cancer Mother     Heart failure Father     Cancer Maternal Aunt     Diabetes Maternal Aunt     Cancer Paternal Aunt        Social History:     Social History     Socioeconomic History    Marital status: Married     Spouse name: Not on file    Number of children: Not on file    Years of education: Not on file    Highest education level: Not on file   Occupational History    Not on file   Tobacco Use    Smoking status: Former    Smokeless tobacco: Never   Vaping Use    Vaping status: Never Used   Substance and Sexual Activity    Alcohol use: No    Drug use: No    Sexual activity: Not on file   Other Topics Concern    Not on file   Social History Narrative    Not on file     Social Determinants of Health     Financial Resource Strain: Not on file   Food Insecurity: No Food Insecurity (05/26/2022)    Hunger Vital Sign     Worried About Running Out of Food in the Last Year: Never true     Ran Out of Food in the Last Year: Never true   Transportation Needs: No Transportation Needs (05/26/2022)    PRAPARE - Therapist, art (Medical): No     Lack of Transportation (Non-Medical): No   Physical Activity: Not on file   Stress: Not on file   Social Connections: Unknown (02/01/2022)    [facility]    Social Network     Social Network: Not on file   Intimate Partner Violence: Not At Risk (05/26/2022)    Humiliation, Afraid, Rape, and Kick questionnaire     Fear of Current or Ex-Partner: No     Emotionally Abused: No     Physically Abused: No     Sexually Abused: No   Housing Stability: Unknown (05/26/2022)    Housing Stability Vital Sign  Unable to Pay for Housing in the Last Year: No     Number of Places Lived in the Last Year: Not on file     Unstable Housing in the Last Year: No       Allergies:      Allergies   Allergen Reactions    Metrizamide Shortness Of Breath and Nausea Only    Rosuvastatin Other (See Comments)     myopathy    myopathy   myopathy    myopathy myopathy      myopathy    myopathy, myopathy    Iodinated Contrast Media Other (See Comments)     Nausea and SOB per patient   On Dialysisi    Nausea and SOB per patient    Nausea and SOB per patient      Nausea and SOB per patient   Nausea and SOB per patient    Nausea and SOB per patient      Nausea and SOB per patient Nausea and SOB per patient      Nausea and SOB per patient Nausea and SOB per patient Nausea and SOB per patient      Nausea and SOB per patient  Nausea and SOB per patient Nausea and SOB per patient    Nausea and SOB per patient   Nausea and SOB per patient    Pioglitazone      Weight gain    Sulfa Antibiotics Hives     blister       Medications:     Medications Prior to Admission   Medication Sig    allopurinol (ZYLOPRIM) 100 MG tablet Take 1 tablet (100 mg) by mouth daily    atorvastatin (LIPITOR) 40 MG tablet Take 1 tablet (40 mg) by mouth daily    conjugated estrogens (PREMARIN) vaginal cream Place vaginally daily    insulin aspart (NOVOLOG) 100 UNIT/ML injection Inject 5-10 Units into the skin as needed    Insulin Degludec Evaristo Bury) 100 UNIT/ML Solution Inject 36 Units into the skin daily    metoprolol tartrate (LOPRESSOR) 25 MG tablet Take 0.5 tablets (12.5 mg) by mouth daily    montelukast (SINGULAIR) 10 MG tablet Take 1 tablet (10 mg) by mouth nightly    ondansetron (Zofran) 4 MG tablet Take 1 tablet (4 mg) by mouth every 8 (eight) hours as needed for Nausea    oxybutynin XL (DITROPAN-XL) 5 MG 24 hr tablet Take 1 tablet (5 mg) by mouth daily    oxyCODONE (OXY-IR) 5 MG capsule Take 1 capsule (5 mg) by mouth every 4 (four) hours as needed    pantoprazole (PROTONIX) 40 MG tablet Take 1 tablet (40 mg) by mouth daily    pregabalin (LYRICA) 50 MG capsule Take 1 capsule (50 mg) by mouth Per pt has not been taking    sevelamer  carbonate (RENVELA) 800 MG tablet Take 1 tablet (800 mg) by mouth 2 (two) times daily    torsemide (DEMADEX) 100 MG tablet Take 1 tablet (100 mg) by mouth daily    umeclidinium-vilanterol (Anoro Ellipta) 62.5-25 MCG/ACT Aerosol Pwdr, Breath Activated Inhale 1 puff into the lungs daily    Zolpidem Tartrate (AMBIEN PO) Take 5 mg by mouth as needed    b complex vitamins capsule Take 1 capsule by mouth daily    cinacalcet (SENSIPAR) 30 MG tablet Take 1 tablet (30 mg) by mouth daily    clopidogrel (PLAVIX) 75 MG tablet Take 1 tablet (75 mg) by mouth daily  hyoscyamine (LEVSIN) 0.125 MG tablet Take 1 tablet (0.125 mg) by mouth every 4 (four) hours as needed for Cramping        Scheduled Meds: PRN Meds:    piperacillin-tazobactam, 2.25 g, Intravenous, Q8H          Continuous Infusions:   sodium chloride 25 mL/hr at 06/29/22 0900    fentaNYL 50 mcg/hr (06/29/22 1000)    insulin regular 3 Units/hr (06/29/22 1000)    norepinephrine 14 mcg/min (06/29/22 1000)    propofol 10 mcg/kg/min (06/29/22 1000)    sodium chloride, , Continuous PRN  sodium chloride, , PRN  sodium chloride, , PRN  dextrose, 15 g of glucose, PRN   Or  dextrose, 12.5 g, PRN   Or  dextrose, 12.5 g, PRN   Or  glucagon (rDNA), 1 mg, PRN  dextrose, 12.5 g, PRN   Or  dextrose, 12.5 g, PRN  fentaNYL (PF), 50 mcg, Q15 Min PRN  fentaNYL, 50 mcg, Q15 Min PRN            Review of Systems:   Unable to obtain, patient is sedated       Physical Exam:     Vitals:    06/29/22 0927 06/29/22 0930 06/29/22 0957 06/29/22 1000   BP:  155/65 133/60 133/60   Pulse:  87  82   Resp:  21  (!) 23   Temp:  98.7 F (37.1 C)     TempSrc:  Axillary     SpO2: 100% 100%  100%   Weight:       Height: 1.6 m (5' 2.99")          Intake and Output Summary (Last 24 hours) at Date Time    Intake/Output Summary (Last 24 hours) at 06/29/2022 1009  Last data filed at 06/29/2022 1000  Gross per 24 hour   Intake 5648.51 ml   Output 1700 ml   Net 3948.51 ml       Recent weights:      05/09/2013  12/17/2014 05/26/2022 06/23/2022 06/25/2022 06/28/2022 06/29/2022   Weight Monitoring   Height 160 cm 160 cm 161.5 cm    160 cm 157.5 cm 160 cm 160 cm 160 cm   Height Method  Stated Stated       Weight 95.255 kg 94.348 kg 88.5 kg    88.451 kg 84.823 kg 87.091 kg 87.09 kg 91.3 kg   Weight Method  Stated Standing Scale    Stated   Standing Scale Bed Scale   BMI (calculated) 37.3 kg/m2 36.9 kg/m2 33.9 kg/m2    34.6 kg/m2 34.2 kg/m2 34 kg/m2 34 kg/m2 35.7 kg/m2       Multiple values from one day are sorted in reverse-chronological order       General: intubated and sedated   HEENT: sclera anicteric  Neck: supple  Cardiovascular: regular rate and rhythm  Lungs: clear to auscultation bilaterally  Abdomen: soft  Extremities: no edema  Neuro: intubated     Labs:     Recent Labs   Lab 06/29/22  0904 06/29/22  0406 06/28/22  2010   WBC 21.92* 20.78* 26.54*   Hemoglobin 7.4* 7.1* 9.3*   Hematocrit 22.0* 21.0* 28.6*   Platelet Count 147 136* 144     Recent Labs   Lab 06/29/22  0406 06/28/22  2250 06/28/22  2010   Sodium 134* 136 136   Potassium 4.2 4.2 4.6   Chloride 102 103 105   CO2 18 15* 15*  BUN 27* 26* 25*   Creatinine 6.7* 6.5* 6.4*   Calcium 8.0 7.9 7.4*   Albumin 2.9*  --   --    Phosphorus 7.1* 8.2* 8.5*   Magnesium 2.4 1.6 1.7   Glucose 338* 324* 332*   GFR 5.6* 5.8* 6.0*         cc: Velia Meyer, MD  Patsy Lager, MD

## 2022-06-29 NOTE — Progress Notes (Signed)
GENERAL SURGERY PROGRESS NOTE  [phone]    Date Time: 06/29/22 10:04 AM  Patient Name: Shelby Barron Day: 1    ASSESSMENT:     Patient Active Problem List   Diagnosis    Type 2 diabetes mellitus with kidney complication, with long-term current use of insulin    Diabetes mellitus type II, uncontrolled    Neuropathy of hand    Left renal mass    Hamstring injury    Peritonitis    ESRD (end stage renal disease)    Chronic diastolic congestive heart failure    Aortic atherosclerosis    Malignant neoplasm of urinary bladder, unspecified site    Malignant tumor of kidney, left    Benign essential hypertension    GERD (gastroesophageal reflux disease)    Leukocytosis    Bladder cancer     82 year old female with bladder cancer and renal failure admitted for cystectomy with bilateral nephrectomy complicated by enterotomy requiring small bowel resection in discontinuity and negative pressure temporary abdominal closure given pressure support and need for resuscitation.  Patient continues to require 15 mcg of norepinephrine, will continue to reevaluate, but most likely undergoing continuous resuscitation and may be reevaluated for abdominal wall closure tomorrow 6/12.  PLAN:   - Diet: N.p.o.  - Abx: Zosyn per primary  - OOB/IS   -ABThera  -Return to OR once patient no longer requires vasopressors    Attending Note:      I, Marlaine Hind, MD, have personally seen and examined this patient and have participated in their care. I agree with all clinical information, including Shelby physical exam, patient history, and planning as documented by Shelby physician assistant/resident. All notes, labs, and imaging were reviewed.  Shelby patient was seen on Shelby date Shelby note was written      Marlaine Hind, MD  [facility]  [phone]     SUBJECTIVE:   Patient intubated and sedated    Objective:   Current Vitals:   Vitals:    06/29/22 1000   BP: 133/60   Pulse: 82   Resp: (!) 23   Temp:    SpO2: 100%        Intake and Output Summary (Last 24 hours):  I/O last 3 completed shifts:  In: 5565.73 [I.V.:3415.73; Blood:650; IV Piggyback:1500]  Out: 1700 [Urine:200; Other:500; Blood:1000]    Labs:     Results       Procedure Component Value Units Date/Time    Calcium, Ionized [[id number]]  (Normal) Collected: 06/29/22 0904    Specimen: Blood, Venous Updated: 06/29/22 0931     Calcium, Ionized 2.24 mEq/L     PT/APTT [[id number]]  (Abnormal) Collected: 06/29/22 0904    Specimen: Blood, Venous Updated: 06/29/22 0926     PT 13.0 sec      INR 1.1     PTT 26 sec     CBC with Differential [[id number]]  (Abnormal) Collected: 06/29/22 0904    Specimen: Blood, Venous Updated: 06/29/22 0938    Narrative:      Shelby following orders were created for panel order CBC with Differential.  Procedure                               Abnormality         Status                     ---------                               -----------         ------  CBC with Differential[[id number]]        Abnormal            Final result                 Please view results for these tests on Shelby individual orders.    CBC with Differential [[id number]]  (Abnormal) Collected: 06/29/22 0904    Specimen: Blood, Venous Updated: 06/29/22 0918     WBC 21.92 x10 3/uL      Hemoglobin 7.4 g/dL      Hematocrit 40.9 %      Platelet Count 147 x10 3/uL      MPV 11.2 fL      RBC 2.45 x10 6/uL      MCV 89.8 fL      MCH 30.2 pg      MCHC 33.6 g/dL      RDW 15 %      nRBC % 0.0 /100 WBC      Absolute nRBC 0.00 x10 3/uL      Preliminary Absolute Neutrophil Count 17.11 x10 3/uL      Neutrophils % 78.0 %      Lymphocytes % 10.9 %      Monocytes % 9.2 %      Eosinophils % 0.4 %      Basophils % 0.2 %      Immature Granulocytes % 1.3 %      Absolute Neutrophils 17.11 x10 3/uL      Absolute Lymphocytes 2.39 x10 3/uL      Absolute Monocytes 2.01 x10 3/uL      Absolute Eosinophils 0.08 x10 3/uL      Absolute Basophils 0.04 x10 3/uL      Absolute Immature Granulocytes 0.29  x10 3/uL     Type and Screen [[id number]] Collected: 06/29/22 0556    Specimen: Blood, Venous Updated: 06/29/22 0701     ABO Rh O Pos     Antibody Screen Negative     Type And Screen Expiration 07/02/2022 23:59    Thromboelastography Clotting Profile [[id number]]  (Abnormal) Collected: 06/28/22 1645    Specimen: Blood, Venous Updated: 06/29/22 0613     TEG R Time 3.8 min      TEG K Time 0.9 min      TEG Angle 75.9 Degree      TEG MA 71.7 mm      TEG CI 4.0    Comprehensive Metabolic Panel [[id number]]  (Abnormal) Collected: 06/29/22 0406    Specimen: Blood, Venous Updated: 06/29/22 0447     Glucose 338 mg/dL      BUN 27 mg/dL      Creatinine 6.7 mg/dL      Sodium 528 mEq/L      Potassium 4.2 mEq/L      Chloride 102 mEq/L      CO2 18 mEq/L      Calcium 8.0 mg/dL      Anion Gap 41.3     GFR 5.6 mL/min/1.73 m2      AST (SGOT) 38 U/L      ALT 19 U/L      Alkaline Phosphatase 34 U/L      Albumin 2.9 g/dL      Protein, Total 4.4 g/dL      Globulin 1.5 g/dL      Albumin/Globulin Ratio 1.9     Bilirubin, Total 0.6 mg/dL     Phosphorus [[id number]]  (Abnormal) Collected: 06/29/22 0406    Specimen: Blood, Venous Updated: 06/29/22  0447     Phosphorus 7.1 mg/dL     Magnesium [[id number]]  (Normal) Collected: 06/29/22 0406    Specimen: Blood, Venous Updated: 06/29/22 0447     Magnesium 2.4 mg/dL     CBC with Differential [[id number]]  (Abnormal) Collected: 06/29/22 0406    Specimen: Blood, Venous Updated: 06/29/22 0440    Narrative:      Shelby following orders were created for panel order CBC with Differential.  Procedure                               Abnormality         Status                     ---------                               -----------         ------                     CBC with Differential[[id number]]        Abnormal            Final result                 Please view results for these tests on Shelby individual orders.    CBC with Differential [[id number]]  (Abnormal) Collected: 06/29/22 0406    Specimen: Blood, Venous  Updated: 06/29/22 0440     WBC 20.78 x10 3/uL      Hemoglobin 7.1 g/dL      Hematocrit 29.5 %      Platelet Count 136 x10 3/uL      MPV 11.3 fL      RBC 2.31 x10 6/uL      MCV 90.9 fL      MCH 30.7 pg      MCHC 33.8 g/dL      RDW 15 %      nRBC % 0.0 /100 WBC      Absolute nRBC 0.00 x10 3/uL      Preliminary Absolute Neutrophil Count 16.16 x10 3/uL      Neutrophils % 77.7 %      Lymphocytes % 10.9 %      Monocytes % 9.6 %      Eosinophils % 0.2 %      Basophils % 0.2 %      Immature Granulocytes % 1.4 %      Absolute Neutrophils 16.16 x10 3/uL      Absolute Lymphocytes 2.26 x10 3/uL      Absolute Monocytes 1.99 x10 3/uL      Absolute Eosinophils 0.04 x10 3/uL      Absolute Basophils 0.04 x10 3/uL      Absolute Immature Granulocytes 0.29 x10 3/uL     Calcium, Ionized [[id number]]  (Abnormal) Collected: 06/29/22 0429    Specimen: Blood, Venous Updated: 06/29/22 0436     Calcium, Ionized 2.16 mEq/L     PT/APTT [[id number]]  (Abnormal) Collected: 06/29/22 0406    Specimen: Blood, Venous Updated: 06/29/22 0434     PT 13.6 sec      INR 1.2     PTT 27 sec     Triglycerides [[id number]]  (Abnormal) Collected: 06/28/22 1645    Specimen: Blood, Venous Updated: 06/29/22 0305     Triglycerides 150 mg/dL  Basic Metabolic Panel [[id number]]  (Abnormal) Collected: 06/28/22 2250    Specimen: Blood, Venous Updated: 06/28/22 2348     Glucose 324 mg/dL      BUN 26 mg/dL      Creatinine 6.5 mg/dL      Calcium 7.9 mg/dL      Sodium 409 mEq/L      Potassium 4.2 mEq/L      Chloride 103 mEq/L      CO2 15 mEq/L      Anion Gap 18.0     GFR 5.8 mL/min/1.73 m2     Magnesium [[id number]]  (Normal) Collected: 06/28/22 2250    Specimen: Blood, Venous Updated: 06/28/22 2348     Magnesium 1.6 mg/dL     Phosphorus [[id number]]  (Abnormal) Collected: 06/28/22 2250    Specimen: Blood, Venous Updated: 06/28/22 2348     Phosphorus 8.2 mg/dL     Lactic Acid [[id number]]  (Abnormal) Collected: 06/28/22 2250    Specimen: Blood, Venous Updated: 06/28/22 2307      Whole Blood Lactic Acid 2.5 mmol/L     Basic Metabolic Panel [[id number]]  (Abnormal) Collected: 06/28/22 2010    Specimen: Blood, Venous Updated: 06/28/22 2303     Glucose 332 mg/dL      BUN 25 mg/dL      Creatinine 6.4 mg/dL      Calcium 7.4 mg/dL      Sodium 010 mEq/L      Potassium 4.6 mEq/L      Chloride 105 mEq/L      CO2 15 mEq/L      Anion Gap 16.0     GFR 6.0 mL/min/1.73 m2     Magnesium [[id number]]  (Normal) Collected: 06/28/22 2010    Specimen: Blood, Venous Updated: 06/28/22 2303     Magnesium 1.7 mg/dL     Phosphorus [[id number]]  (Abnormal) Collected: 06/28/22 2010    Specimen: Blood, Venous Updated: 06/28/22 2303     Phosphorus 8.5 mg/dL     CBC with Differential [[id number]]  (Abnormal) Collected: 06/28/22 2010    Specimen: Blood, Venous Updated: 06/28/22 2038    Narrative:      Shelby following orders were created for panel order CBC with Differential.  Procedure                               Abnormality         Status                     ---------                               -----------         ------                     CBC with Differential[[id number]]        Abnormal            Final result                 Please view results for these tests on Shelby individual orders.    CBC with Differential [[id number]]  (Abnormal) Collected: 06/28/22 2010    Specimen: Blood, Venous Updated: 06/28/22 2038     WBC 26.54 x10 3/uL      Hemoglobin 9.3 g/dL      Hematocrit 60.6 %  Platelet Count 144 x10 3/uL      MPV 10.6 fL      RBC 3.09 x10 6/uL      MCV 92.6 fL      MCH 30.1 pg      MCHC 32.5 g/dL      RDW 15 %      nRBC % 0.1 /100 WBC      Absolute nRBC 0.03 x10 3/uL      Preliminary Absolute Neutrophil Count 21.17 x10 3/uL      Neutrophils % 79.8 %      Lymphocytes % 9.3 %      Monocytes % 8.2 %      Eosinophils % 0.1 %      Basophils % 0.2 %      Immature Granulocytes % 2.4 %      Absolute Neutrophils 21.17 x10 3/uL      Absolute Lymphocytes 2.47 x10 3/uL      Absolute Monocytes 2.17 x10 3/uL      Absolute  Eosinophils 0.03 x10 3/uL      Absolute Basophils 0.05 x10 3/uL      Absolute Immature Granulocytes 0.65 x10 3/uL     PT/APTT [[id number]]  (Abnormal) Collected: 06/28/22 2010    Specimen: Blood, Venous Updated: 06/28/22 2036     PT 13.9 sec      INR 1.2     PTT 25 sec     Lactic Acid [[id number]]  (Abnormal) Collected: 06/28/22 2010    Specimen: Blood, Venous Updated: 06/28/22 2035     Whole Blood Lactic Acid 3.5 mmol/L     Arterial Blood Gas [[id number]]  (Abnormal) Collected: 06/28/22 2011    Specimen: Blood, Arterial Updated: 06/28/22 2031     Arterial pH 7.324     Arterial pCO2 29.4 mmHg      Arterial pO2 330.0 mmHg      Arterial HCO3 15.0 mEq/L      Arterial CO2 14.3 mEq/L      Arterial Base Excess -9.8 mEq/L      Arterial O2 Saturation 99.2 %      Temperature 36.2 C      FIO2 100 %      PEEP 5 cmH2O      Tidal Volume 400 mL     Arterial Blood Gas [[id number]]  (Abnormal) Collected: 06/28/22 1643    Specimen: Blood, Arterial Updated: 06/28/22 1812     Arterial pH 7.242     Arterial pCO2 43.7 mmHg      Arterial pO2 221.0 mmHg      Arterial HCO3 18.2 mEq/L      Arterial CO2 17.5 mEq/L      Arterial Base Excess -8.3 mEq/L      Arterial O2 Saturation 98.9 %      Temperature 37.0 C      FIO2 100 %      Set Rate 115 BPM      Mode prvc     PEEP 5 cmH2O      Tidal Volume 400 mL     Basic Metabolic Panel [[id number]]  (Abnormal) Collected: 06/28/22 1645    Specimen: Blood, Venous Updated: 06/28/22 1748     Glucose 274 mg/dL      BUN 23 mg/dL      Creatinine 6.2 mg/dL      Calcium 7.8 mg/dL      Sodium 301 mEq/L      Potassium 4.5 mEq/L      Chloride 105  mEq/L      CO2 20 mEq/L      Anion Gap 13.0     GFR 6.2 mL/min/1.73 m2     Phosphorus [[id number]]  (Abnormal) Collected: 06/28/22 1645    Specimen: Blood, Venous Updated: 06/28/22 1748     Phosphorus 8.4 mg/dL     Magnesium [[id number]]  (Normal) Collected: 06/28/22 1645    Specimen: Blood, Venous Updated: 06/28/22 1748     Magnesium 1.7 mg/dL     PT/APTT [[id number]]   (Abnormal) Collected: 06/28/22 1645    Specimen: Blood, Venous Updated: 06/28/22 1733     PT 15.1 sec      INR 1.3     PTT 25 sec     CBC with Differential [[id number]]  (Abnormal) Collected: 06/28/22 1645    Specimen: Blood, Venous Updated: 06/28/22 1733    Narrative:      Shelby following orders were created for panel order CBC with Differential.  Procedure                               Abnormality         Status                     ---------                               -----------         ------                     CBC with Differential[[id number]]        Abnormal            Final result                 Please view results for these tests on Shelby individual orders.    CBC with Differential [[id number]]  (Abnormal) Collected: 06/28/22 1645    Specimen: Blood, Venous Updated: 06/28/22 1733     WBC 19.25 x10 3/uL      Hemoglobin 9.5 g/dL      Hematocrit 24.4 %      Platelet Count 146 x10 3/uL      MPV 10.4 fL      RBC 3.14 x10 6/uL      MCV 95.9 fL      MCH 30.3 pg      MCHC 31.6 g/dL      RDW 15 %      nRBC % 0.1 /100 WBC      Absolute nRBC 0.02 x10 3/uL      Preliminary Absolute Neutrophil Count 15.17 x10 3/uL      Neutrophils % 78.8 %      Lymphocytes % 11.0 %      Monocytes % 8.2 %      Eosinophils % 0.1 %      Basophils % 0.2 %      Immature Granulocytes % 1.7 %      Absolute Neutrophils 15.17 x10 3/uL      Absolute Lymphocytes 2.11 x10 3/uL      Absolute Monocytes 1.58 x10 3/uL      Absolute Eosinophils 0.02 x10 3/uL      Absolute Basophils 0.04 x10 3/uL      Absolute Immature Granulocytes 0.33 x10 3/uL     Lactic Acid [[id number]]  (Abnormal) Collected: 06/28/22 1645  Specimen: Blood, Venous Updated: 06/28/22 1727     Whole Blood Lactic Acid 3.6 mmol/L     Calcium, Ionized [[id number]]  (Normal) Collected: 06/28/22 1645    Specimen: Blood, Venous Updated: 06/28/22 1727     Calcium, Ionized 2.23 mEq/L     CBC with Differential [[id number]]  (Abnormal) Collected: 06/28/22 1511    Specimen: Blood, Arterial Updated:  06/28/22 1551    Narrative:      Shelby following orders were created for panel order CBC with Differential.  Procedure                               Abnormality         Status                     ---------                               -----------         ------                     CBC with Differential[[id number]]        Abnormal            Final result                 Please view results for these tests on Shelby individual orders.    CBC with Differential [[id number]]  (Abnormal) Collected: 06/28/22 1511    Specimen: Blood, Arterial Updated: 06/28/22 1551     WBC 18.10 x10 3/uL      Hemoglobin 9.1 g/dL      Hematocrit 08.6 %      Platelet Count 127 x10 3/uL      MPV 10.5 fL      RBC 2.98 x10 6/uL      MCV 94.3 fL      MCH 30.5 pg      MCHC 32.4 g/dL      RDW 15 %      nRBC % 0.0 /100 WBC      Absolute nRBC 0.00 x10 3/uL      Preliminary Absolute Neutrophil Count 13.40 x10 3/uL      Neutrophils % 74.0 %      Lymphocytes % 16.8 %      Monocytes % 7.4 %      Eosinophils % 0.2 %      Basophils % 0.2 %      Immature Granulocytes % 1.4 %      Absolute Neutrophils 13.40 x10 3/uL      Absolute Lymphocytes 3.04 x10 3/uL      Absolute Monocytes 1.34 x10 3/uL      Absolute Eosinophils 0.03 x10 3/uL      Absolute Basophils 0.04 x10 3/uL      Absolute Immature Granulocytes 0.25 x10 3/uL     Basic Metabolic Panel [[id number]]  (Abnormal) Collected: 06/28/22 1341    Specimen: Blood, Venous Updated: 06/28/22 1417     Glucose 306 mg/dL      BUN 22 mg/dL      Creatinine 5.9 mg/dL      Calcium 6.6 mg/dL      Sodium 528 mEq/L      Potassium 3.6 mEq/L      Chloride 104 mEq/L      CO2 20 mEq/L  Anion Gap 14.0     GFR 6.6 mL/min/1.73 m2     RBC O.R. HOLD Blood Product, 2 Units [[id number]] Collected: 06/25/22 1342     Updated: 06/28/22 1412     RBC Leukoreduced RBC LR     Unit Number [id number]     Product Status transfused     Product Code E0336V00     Unit Blood Type O POS     Expiration Date [id number]     RBC Leukoreduced RBC LR      Unit Number [id number]     Product Status transfused     Product Code E0336V00     Unit Blood Type O POS     Expiration Date [id number]    CBC without Differential [[id number]]  (Abnormal) Collected: 06/28/22 1341    Specimen: Blood, Venous Updated: 06/28/22 1404     WBC 17.05 x10 3/uL      Hemoglobin 4.1 g/dL      Hematocrit 56.3 %      Platelet Count 162 x10 3/uL      MPV 9.7 fL      RBC 1.35 x10 6/uL      MCV 99.3 fL      MCH 30.4 pg      MCHC 30.6 g/dL      RDW 15 %      nRBC % 0.0 /100 WBC      Absolute nRBC 0.00 x10 3/uL     PT/INR [[id number]]  (Abnormal) Collected: 06/28/22 1341    Specimen: Blood, Venous Updated: 06/28/22 1402     PT 15.2 sec      INR 1.3    Arterial Blood Gas [[id number]]  (Abnormal) Collected: 06/28/22 1158    Specimen: Blood, Arterial Updated: 06/28/22 1222     Arterial pH 7.395     Arterial pCO2 44.2 mmHg      Arterial pO2 153.0 mmHg      Arterial HCO3 26.7 mEq/L      Arterial CO2 26.9 mEq/L      Arterial Base Excess 2.2 mEq/L      Arterial O2 Saturation 98.8 %      Temperature 36.5 C             Rads:     Radiology Results (24 Hour)       Procedure Component Value Units Date/Time    XR Chest AP Portable [[id number]] Collected: 06/28/22 1649    Order Status: Completed Updated: 06/28/22 1709    Narrative:      HISTORY: Intubation     COMPARISON: 5/12    FINDINGS: Endotracheal tube tip is in Shelby right mainstem bronchus  esophageal sump tube tip in Shelby stomach. Right-sided dialysis catheter tip  extends into Shelby right atrium. Hypoinflated lungs. Small foci of  atelectasis in Shelby lung bases. Moderate hiatal hernia. No definite  pulmonary edema. No visible pneumothorax or pleural effusion.      Impression:       Right mainstem bronchial intubation. Shelby tube should be  withdrawn about 4 cm.    Dr. Tildon Husky is aware.    Wynema Birch, MD  06/28/2022 4:56 PM    XR OR MISCOUNT [[id number]] Collected: 06/28/22 1502    Order Status: Completed Updated: 06/28/22 1505    Narrative:      HISTORY:  Surgical instrument miscount.    COMPARISON: None.    FINDINGS: No unexpected foreign body is identified.      Impression:  No unexpected foreign body.    These critical results were discussed with and acknowledged by Angie R.,  Shelby circulating nurse, who will communicate Shelby findings to Everardo All,  MD on 06/28/2022 3:02 PM.      Bosie Helper, MD  06/28/2022 3:03 PM              Medications:     Current Facility-Administered Medications   Medication Dose Route Frequency    piperacillin-tazobactam  2.25 g Intravenous Q8H      sodium chloride 25 mL/hr at 06/29/22 0900    fentaNYL 50 mcg/hr (06/29/22 0941)    insulin regular 3 Units/hr (06/29/22 0900)    norepinephrine 14 mcg/min (06/29/22 0957)    propofol 10 mcg/kg/min (06/29/22 0941)     sodium chloride, sodium chloride, sodium chloride, dextrose **OR** dextrose **OR** dextrose **OR** glucagon (rDNA), dextrose **OR** dextrose, fentaNYL (PF), fentaNYL **AND** fentaNYL     Physical Exam:   Physical Exam  Vitals reviewed.   Constitutional:       General: She is not in acute distress.     Appearance: She is ill-appearing.   Eyes:      General: No scleral icterus.  Cardiovascular:      Rate and Rhythm: Normal rate and regular rhythm.      Pulses: Normal pulses.      Comments: 15cg norepinephrine  Pulmonary:      Comments: FiO2:  [29 %-100 %] 30 %  S RR:  [15-22] 22  S VT:  [400 mL] 400 mL  PEEP/EPAP:  [5 cm H20-8 cm H20] 5 cm H20    Abdominal:      General: There is no distension.      Palpations: Abdomen is soft.      Comments: ABThera in place holding suction with serosanguineous output   Skin:     General: Skin is warm.      Coloration: Skin is not jaundiced.   Neurological:      Comments: Intubated and sedated          Signed by:   Adele Dan, MD  General Surgery, PGY4  June 29, 2022    *This note was generated by [place] [employer] EMR system/ Dragon speech recognition and may contain inherent errors or omissions not intended by Shelby user. Grammatical errors, random  word insertions, deletions, pronoun errors and incomplete sentences are occasional consequences of this technology due to software limitations. Not all errors are caught or corrected. If there are questions or concerns about Shelby content of this note or information contained within Shelby body of this dictation they should be addressed directly with Shelby author for clarification

## 2022-06-29 NOTE — Malnutrition Assessment (Signed)
Shelby Barron is a 82 y.o. female patient.   [id number]    Malnutrition Assessment   Malnutrition Documentation    Moderate Malnutrition related to inadequate nutritional intake in the setting of acute illness as evidenced by <50% EER > 5 days, mild muscle losses (temporalis, deltoid) - new               Ardath Sax, RDN      If physician disagrees with this assessment see addendum.

## 2022-06-29 NOTE — UM Notes (Signed)
Order [id number]   06/28/22 1446  Admit to Inpatient  Once          Initial Review  Class: Inpatient  Level of Care: Level 1 (ICU)         Patient Name: Shelby Barron, Shelby Barron   Date of Birth 10-23-1940   MRN: [id number]          Summary:  VIKI SCURTO is a 82 y.o. female who presented to the hospital "for scheduled cystectomy and completion nephrectomy with urology.  Her last Plavix dose was 6/3, her last dialysis was 6/8.  She had preop clearance with cardiology which included an echo."    "In the OR course was complicated by  Small bowel enterotomy -General surgery called in enterotomy repaired with initial plan to put patient back into continuity"    "Later in the case significant bleeding during nephrectomy/cystectomy.  Approximately 1 L EBL.  Given significant bleeding decision made to leave patient in discontinuity and abdomen open with plans to return to the OR tomorrow."    "In the OR given  3 units PRBC  3000 mL crystalloid  colloid"      Medical History:  Past Medical History:   Diagnosis Date    Acid reflux     Asthma, well controlled     Cancer of kidney     Chronic lower back pain     Congestive heart disease     COPD (chronic obstructive pulmonary disease)     CVA (cerebral infarction) 01/12/2003    Diabetes     Diabetic nephropathy     Fibroids     Gout     H/O: gout     Hyperlipidemia     Hypertension     Neuropathy of hand     SOB (shortness of breath)            Active Problems:  Patient Active Problem List   Diagnosis    Type 2 diabetes mellitus with kidney complication, with long-term current use of insulin    Diabetes mellitus type II, uncontrolled    Neuropathy of hand    Left renal mass    Hamstring injury    Peritonitis    ESRD (end stage renal disease)    Chronic diastolic congestive heart failure    Aortic atherosclerosis    Malignant neoplasm of urinary bladder, unspecified site    Malignant tumor of kidney, left    Benign essential hypertension    GERD (gastroesophageal reflux disease)     Leukocytosis    Bladder cancer           Care Day - 06/28/2022    Per H&P Note:  Plan      NEUROLOGICAL:   #History of CVA  Hold home plavix  Sedated with propofol/fentanyl  Titrating for RASS of -2     CARDIOVASCULAR:   #Mixed shock - hemorrhagic vs septic  Had EBL approximately 1 L in the OR also with small bowel enterotomy  Transfused 3 units PRBC in OR  Levophed gtt. for BP support titrating for MAP greater than 65  CBC, coags every 6 hours  Will resuscitate as indicated  Bleeding seems to have abated but will give 1 dose of DDAVP for uremic bleeding  Had TTE as part of preop cardiac clearance  Mild aortic stenosis (AVA 1.7), grade 1 diastolic dysfunction, normal LV/RV systolic function     PULMONARY:   #Acute hypoxemic respiratory failure  #COPD  Adjusted ventilator targeting  lung protective ventilation strategy (Tidal volume 4-8 cc/kg, limiting plateau pressure < 30 ccH2O)  Chest x-ray reviewed ET tube deep lower retract 2 cm  Will plan to keep intubated until return to the OR     RENAL:   #ESRD  On HD Tuesday Thursday Saturday  Last dialysis was 6/8  Labs reviewed no acute indication for RRT will likely need dialysis before considering extubation and ideally before return to the OR for closure  Nephrology consulted     GASTROINTESTINAL:  #S/P Cystectomy and completion nephrectomy  POD0 from cystectomy and nephrectomy complicated by small bowel enterotomy and hemorrhagic shock  Resuscitation as above  Patient in discontinuity with open abdomen, tentative plan for return to the OR tomorrow for closure  Will cover with Zosyn for any intra-abdominal spillage secondary to enterotomy     INFECTIOUS DISEASE:   Zosyn for intra-abdominal spillage     HEMATOLOGY/ONCOLOGY:   #Renal cell carcinoma  #Urothelial carcinoma  - VTE ppx: SCDs alone given bleeding     ENDOCRONOLOGY/RHEUMATOLOGY:   #Diabetes Mellitus type 2  Sliding scale insulin  On Lantus 12U daily at home          Per Op Note:  Operative Procedure:   Radical  cystectomy, lysis of adhesions     Preoperative Diagnosis:   Pre-Op Diagnosis Codes:     * Malignant neoplasm of urinary bladder, unspecified site [C67.9]     * Malignant tumor of kidney, left [C64.2]     Postoperative Diagnosis:   Post-Op Diagnosis Codes:     * Malignant neoplasm of urinary bladder, unspecified site [C67.9]     * Malignant tumor of kidney, left [C64.2]     Anesthesia:   general     Findings:   Pelvic adhesions, small bowel adhesions to previous periumbilical herniorrhaphy, bladder cancer, right hydronephrosis     Indications:   Invasive bladder cancer, renal failure    Estimated Blood Loss:   1000 mL     Implants:   None        Drains:   Drains: None       Complications:   Unplanned enterotomy due to significant abdominal adhesions           Per Op Note:  Operative Procedure:   Procedure(s):  Procedure(s):  CYSTECTOMY, RADICAL, TOTAL  Bilateral nephrectomy  Negative pressure temporary abdominal closure         Preoperative Diagnosis:   Pre-Op Diagnosis Codes:     * Malignant neoplasm of urinary bladder, unspecified site [C67.9]     * Malignant tumor of kidney, left [C64.2]     Postoperative Diagnosis:   Post-Op Diagnosis      Malignant neoplasm of bladder, left kidney, abdominal wall adhesions     Anesthesia:   General     Fluids (I/O):   EBL:  10 mL from general surgery part of case  Crystalloid: 3000 mL   Colloid: 750 mL   Blood Products: 3u PRBC mL     Currently on phenylephrine, receiving vasopressin pushes      Drains:   Drain: none    Implants:   none     Findings:   Bowel adhered to abdominal wall at mesh, enterotomy found and small bowel section resected. Patient left in discontinuity given pressor requirement and acute blood loss anemia to hemoglobin 4.1.      Wound Class:   Clean-Contaminated     Complications:   none     Plans:   -  Diet: NPO IVF  - Follow up post operative labs (CBC, TEG, lactate, BMP, magnesium, phosphorus )  - Resuscitate, transfuse for hemoglobin less than 7  -  Return to OR 6/11 for abdominal closure  - Abx: none indicated from general surgery perspective  - DVT ppx   - OOB/IS   - Abthera        Vent Settings:  FiO2:  [29 %-100 %] 30 %  S RR:  [15-22] 22  S VT:  [400 mL] 400 mL  PEEP/EPAP:  [5 cm H20-8 cm H20] 5 cm H20         Vitals:  06/28/22 2350 -- -- 80 22 113/56 100 % --   06/28/22 2345 -- -- 80 22 113/56 100 % --   06/28/22 2335 -- -- 106 Abnormal  25 Abnormal  125/58 100 % --   06/28/22 2330 -- -- 86 22 125/58 100 % --   06/28/22 2315 -- -- 79 20 128/55 100 % --   06/28/22 2300 -- -- 86 21 129/59 100 % --   06/28/22 2245 -- -- 88 23 Abnormal  126/61 100 % --   06/28/22 2230 -- -- 97 26 Abnormal  153/70 100 % --   06/28/22 2215 -- -- 91 36 Abnormal  115/60 100 % --   06/28/22 2200 -- -- 104 Abnormal  22 -- 90 % --   06/28/22 2145 -- -- 92 23 Abnormal  136/67 100 % --   06/28/22 2143 -- -- 90 21 136/67 100 % --   06/28/22 2130 -- -- 93 21 136/58 100 % --   06/28/22 2120 -- -- 97 21 118/62 100 % --   06/28/22 2115 -- -- 101 Abnormal  24 Abnormal  -- 100 % --   06/28/22 2100 -- -- 100 22 107/56 100 % --   06/28/22 2045 -- -- 104 Abnormal  24 Abnormal  104/57 100 % --   06/28/22 2030 -- -- 104 Abnormal  24 Abnormal  96/52 100 % --   06/28/22 2015 -- -- 109 Abnormal  33 Abnormal  102/56 100 % --   06/28/22 2010 -- -- 109 Abnormal  25 Abnormal  102/56 100 % --   06/28/22 2000 97.1 F (36.2 C) -- 104 Abnormal  23 Abnormal  95/54 100 % --   06/28/22 1945 -- -- 109 Abnormal  23 Abnormal  94/57 100 % --   06/28/22 1932 -- -- 111 Abnormal  22 89/53 Abnormal  100 % --   06/28/22 1930 -- -- 111 Abnormal  22 89/53 Abnormal  100 % --   06/28/22 1920 -- -- 120 Abnormal  22 118/61 95 % --   06/28/22 1915 -- -- 121 Abnormal  23 Abnormal  -- 100 % --   06/28/22 1900 -- -- 121 Abnormal  22 115/71 96 % --   06/28/22 1856 -- -- -- -- 78/53 Abnormal  -- --   06/28/22 1845 -- -- 112 Abnormal  22 -- 100 % --   06/28/22 1844 -- -- 112 Abnormal  22 97/55 100 % --   06/28/22 1840 -- -- -- --  -- 100 %  --   06/28/22 1838 -- -- -- -- 118/57 -- --   06/28/22 1830 -- -- 108 Abnormal  16 118/57 100 % --   06/28/22 1823 -- -- -- -- 120/57 -- --   06/28/22 1815 -- -- 104 Abnormal  15 120/57 100 % --   06/28/22  1800 -- -- 102 Abnormal  14 96/52 100 % --   06/28/22 1752 -- -- -- -- 90/43 -- --   06/28/22 1745 -- -- 101 Abnormal  0 Abnormal  95/51 100 % --   06/28/22 1735 -- -- -- 18 -- -- --   06/28/22 1730 -- -- 96 18 86/51 Abnormal  100 % --   06/28/22 1723 -- -- -- 0 Abnormal  -- 100 % --   06/28/22 1715 -- -- 98 15 113/55 100 % --   06/28/22 1700 -- -- 96 16 93/52 100 % --   06/28/22 1646 -- -- -- -- 119/54 -- --   06/28/22 1645 -- -- 88 16 119/54 100 % --   06/28/22 1630 -- -- 85 14 70/40 Abnormal  99 % --   06/28/22 1625 98.9 F (37.2 C) -- -- -- 82/54 Abnormal  -- --   06/28/22 1615 98 F (36.7 C) -- 96 25 Abnormal  110/56 100 % --   06/28/22 1600 98.6 F (37 C) -- 82 14 105/72 99 % --   06/28/22 0630 98.2 F (36.8 C) -- 88 30 Abnormal  103/64 93 % --         SpO2: 100 % (06/29/2022  8:45 AM)  O2 Device: Vent (06/29/2022  8:45 AM)  FiO2: 30 % (06/29/2022  8:45 AM)  Oximetry Probe Site Changed: No (06/29/2022  8:45 AM)      Weight:   Recent Weights for the past 720 hrs (Last 3 readings):   Weight   06/29/22 0630 91.3 kg (201 lb 4.5 oz)   06/28/22 0630 87.1 kg (192 lb)           Labs:   Latest Reference Range & Units 06/28/22 07:02 06/28/22 11:58 06/28/22 13:41 06/28/22 15:11 06/28/22 16:43 06/28/22 16:45 06/28/22 20:10   WBC 3.10 - 9.50 x10 3/uL   17.05 (H) 18.10 (H)  19.25 (H) 26.54 (H)   Hemoglobin 11.4 - 14.8 g/dL   4.1 (LL) 9.1 (L)  9.5 (L) 9.3 (L)   Hematocrit 34.7 - 43.7 %   13.4 (L) 28.1 (L)  30.1 (L) 28.6 (L)   Platelet Count 142 - 346 x10 3/uL   162 127 (L)  146 144   Glucose 70 - 100 mg/dL 161 (H)  096 (H)   045 (H) 332 (H)   BUN 7 - 21 mg/dL 22 (H)  22 (H)   23 (H) 25 (H)   Creatinine 0.4 - 1.0 mg/dL 5.3 (H)  5.9 (H)   6.2 (H) 6.4 (H)   Potassium 3.5 - 5.3 mEq/L 3.4 (L)  3.6   4.5 4.6   Chloride  99 - 111 mEq/L 94 (L)  104   105 105   CO2 17 - 29 mEq/L 29  20   20 15  (L)   Calcium 7.9 - 10.2 mg/dL 8.8  6.6 (L)   7.8 (L) 7.4 (L)   Anion Gap 5.0 - 15.0  14.0  14.0   13.0 16.0 (H)   EGFR >=60.0 mL/min/1.73 m2 7.5 (L)  6.6 (L)   6.2 (L) 6.0 (L)   Phosphorus 2.3 - 4.7 mg/dL      8.4 (H) 8.5 (H)   Whole Blood Lactate 0.2 - 2.0 mmol/L      3.6 (H) 3.5 (H)   Triglycerides 34 - 149 mg/dL      409 (H)    Arterial pH 7.350 - 7.450   7.395   7.242 (L)  pO2, Arterial 83.0 - 108.0 mmHg  153.0 (H)   221.0 (H)     HCO3, Arterial 23.0 - 28.0 mEq/L  26.7   18.2 (L)     Arterial Total CO2 23.0 - 30.0 mEq/L  26.9   17.5 (L)     Base Excess, Arterial -3.2 - 2.7 mEq/L  2.2   -8.3 (L)     O2 Sat, Arterial 94.0 - 98.0 %  98.8 (H)   98.9 (H)     PT 10.1 - 12.9 sec   15.2 (H)   15.1 (H) 13.9 (H)   PTT 27 - 39 sec      25 (L) 25 (L)   TEG CI -3.0 - 3.0       4.0 (H)    TEG K Time 1.0 - 3.0 min      0.9 (L)    TEG MA 51.0 - 69.0 mm      71.7 (H)          Imaging:  XR Chest AP Portable    Result Date: 06/28/2022   Right mainstem bronchial intubation. The tube should be withdrawn about 4 cm. Dr. Tildon Husky is aware. Wynema Birch, MD 06/28/2022 4:56 PM        Medications:  cefOXitin (MEFOXIN) 2 g in sodium chloride 0.9 % 100 mL IVPB mini-bag plus  Dose: 2 g  Freq: Every 8 hours Route: IV  Start: 06/28/22 0800 End: 06/28/22 1644   Order specific questions:       0800   08479   [id number]   1644-D/C'd  11   1644-D/C'd  12              insulin lispro injection 1-5 Units  Dose: 1-5 Units  Freq: Every 4 hours scheduled Route: SC  Start: 06/28/22 1700 End: 06/29/22 0221    [id number]   2041 [C]16   [id number]               piperacillin-tazobactam (ZOSYN) 2.25 g IVPB 50 mL (premix) 2.25 g  Dose: 2.25 g  Freq: Every 8 hours Route: IV  Last Dose: Stopped (06/29/22 0136)  Start: 06/28/22 1730   Order specific questions:       [id number]   401027   [id number]                   One Time Medications:  albumin human 5 % 12.5 g  Dose: 12.5 g  Freq: Once Route: IV  Last Dose:  Stopped (06/28/22 2141)  Start: 06/28/22 2045 End: 06/28/22 2141   Order specific questions:       40347   42595                albumin human 5 % 12.5 g  Dose: 12.5 g  Freq: Once Route: IV  Last Dose: Stopped (06/28/22 1734)  Start: 06/28/22 1645 End: 06/28/22 1734   Order specific questions:       16255   17346                desmopressin (DDAVP) 26.12 mcg in sodium chloride 0.9 % 50 mL IVPB  Dose: 0.3 mcg/kg  Weight Dosing Info: 87.1 kg  Freq: Once Route: IV  Last Dose: Stopped (06/28/22 1750)  Start: 06/28/22 1730 End: 06/28/22 1750    [id number]   175014                    Continuous IV Infusions:  0.9% NaCl  infusion  Rate: 20 mL/hr Freq: Continuous PRN Route: IV  PRN Reason: Other  PRN Comment: KVO for pre-op dialysis patients per protocol  Start: 06/28/22 0735    07201   07332   10373   15454               fentaNYL 1000 mcg in sodium chloride 0.9% infusion 100 mL  Rate: 0-10 mL/hr Dose: 0-100 mcg/hr  Freq: Continuous Route: IV  Last Dose: 50 mcg/hr (06/29/22 0900)  Start: 06/28/22 1630    16577   17008   9   180010   183311   190012   161096   223014          And  fentaNYL (SUBLIMAZE) bolus from bag 50 mcg  Dose: 50 mcg  Freq: Every 15 min PRN Route: IV  PRN Reason: Pain  PRN Comment: CPOT above 2, while on infusion.  Start: 06/28/22 1548    045409   220020   811914               norepinephrine (LEVOPHED) 8 mg in dextrose 5% 250 mL infusion  Rate: 0-37.5 mL/hr Dose: 0-20 mcg/min  Freq: Continuous Route: IV  Last Dose: 17 mcg/min (06/29/22 0900)  Start: 06/28/22 1645   Order specific questions:       [id number]   213086   31   170032   578469   180034   629528   413244   010272   536644     034742   595638   756433   295188   416606             propofol (DIPRIVAN) 10 mg/mL infusion (ADULT)  Rate: 0-15.7 mL/hr Dose: 0-30 mcg/kg/min  Weight Dosing Info: 87.1 kg  Freq: Continuous Route: IV  Last Dose: 10 mcg/kg/min (06/29/22 0900)  Start: 06/28/22 1630    301601   170057   58   180059   180160   183261   093235   184463    190064   573220     254270   623762                        PRN Medications:  And  fentaNYL (SUBLIMAZE) bolus from bag 50 mcg  Dose: 50 mcg  Freq: Every 15 min PRN Route: IV  PRN Reason: Pain  PRN Comment: CPOT above 2, while on infusion.  Start: 06/28/22 1548    [id number]   220020   616073                           ______________________________________________________________________      Care Day - 06/29/2022      Per Surgery Note:  PLAN:   - Diet: N.p.o.  - Abx: Zosyn per primary  - OOB/IS   -ABThera  -Return to OR once patient no longer requires vasopressors        Per Nephrology Note:  Recommendations:      Will initiate CRRT  Agree with pRBC  Phos binders when taking PO  If hemodynamically stable, will plan to transition to HD tomorrow           Vent Settings:  FiO2:  [29 %-100 %] 30 %  S RR:  [15-22] 22  S VT:  [400 mL] 400 mL  PEEP/EPAP:  [5 cm H20-8 cm H20] 5 cm H20  Vitals:  Patient Vitals for the past 24 hrs:   BP Temp Temp src Pulse Resp SpO2 Height Weight   06/29/22 1200 134/62 99.2 F (37.3 C) -- 80 (!) 23 100 % -- --   06/29/22 1130 123/59 99.2 F (37.3 C) Axillary 82 22 100 % -- --   06/29/22 1115 136/63 -- -- 79 (!) 23 100 % -- --   06/29/22 1103 (!) 140/96 -- -- 79 22 100 % -- --   06/29/22 1100 140/63 -- -- 80 22 100 % -- --   06/29/22 1030 127/61 -- -- 79 22 100 % -- --   06/29/22 1000 133/60 -- -- 82 (!) 23 100 % -- --   06/29/22 0957 133/60 -- -- -- -- -- -- --   06/29/22 0930 155/65 98.7 F (37.1 C) Axillary 87 21 100 % -- --   06/29/22 0927 -- -- -- -- -- 100 % 1.6 m (5' 2.99") --   06/29/22 0915 -- -- -- 80 22 100 % -- --   06/29/22 0900 136/63 -- -- 80 22 100 % -- --   06/29/22 0845 145/64 99.1 F (37.3 C) Axillary 81 22 100 % -- --   06/29/22 0830 142/63 -- -- 82 20 100 % -- --   06/29/22 0827 142/63 98.8 F (37.1 C) Axillary 81 21 100 % -- --   06/29/22 0815 -- -- -- 81 22 100 % -- --   06/29/22 0800 145/64 -- -- 79 22 100 % -- --   06/29/22 0756 -- 98.3 F (36.8 C) Axillary --  -- -- -- --   06/29/22 0745 -- -- -- 80 22 100 % -- --   06/29/22 0700 152/66 -- -- 78 22 100 % -- --   06/29/22 0645 -- -- -- 77 22 100 % -- --   06/29/22 0630 151/67 -- -- 76 22 100 % -- 91.3 kg (201 lb 4.5 oz)   06/29/22 0620 151/67 -- -- 75 22 100 % -- --   06/29/22 0600 137/62 -- -- 77 22 100 % -- --   06/29/22 0545 -- -- -- 78 22 100 % -- --   06/29/22 0530 119/56 -- -- 76 (!) 23 100 % -- --   06/29/22 0520 -- -- -- 79 22 100 % -- --   06/29/22 0515 -- -- -- 78 22 100 % -- --   06/29/22 0500 133/61 -- -- 79 22 100 % -- --   06/29/22 0445 -- -- -- 83 (!) 30 100 % -- --   06/29/22 0433 -- -- -- -- -- 100 % -- --   06/29/22 0430 141/65 -- -- 75 22 100 % -- --   06/29/22 0415 -- -- -- 76 22 100 % -- --   06/29/22 0400 137/63 97.6 F (36.4 C) Axillary 72 22 100 % -- --   06/29/22 0345 -- -- -- 72 22 100 % -- --   06/29/22 0330 143/65 -- -- 72 22 100 % -- --   06/29/22 0315 -- -- -- 72 22 100 % -- --   06/29/22 0300 132/60 -- -- 73 22 100 % -- --   06/29/22 0245 136/63 -- -- 72 22 100 % -- --   06/29/22 0230 131/60 -- -- 72 22 100 % -- --   06/29/22 0224 -- -- -- 72 (!) 24 100 % -- --   06/29/22 0220 -- -- -- -- -- 100 % -- --  06/29/22 0215 129/61 -- -- 72 22 100 % -- --   06/29/22 0200 113/56 -- -- 74 22 100 % -- --   06/29/22 0145 111/55 -- -- 75 22 100 % -- --   06/29/22 0130 110/53 -- -- 78 22 100 % -- --   06/29/22 0115 115/57 -- -- 79 (!) 24 100 % -- --   06/29/22 0100 110/57 -- -- 79 22 100 % -- --   06/29/22 0052 -- -- -- -- -- 100 % -- --   06/29/22 0045 117/55 -- -- 78 22 100 % -- --   06/29/22 0034 117/58 -- -- 84 20 100 % -- --   06/29/22 0030 117/58 -- -- 78 22 100 % -- --   06/29/22 0020 115/56 -- -- 79 (!) 23 100 % -- --   06/29/22 0015 115/56 -- -- 78 22 100 % -- --   06/29/22 0009 -- -- -- -- -- 100 % -- --   06/29/22 0003 -- -- -- -- -- 100 % -- --   06/29/22 0000 121/59 (!) 96.8 F (36 C) Axillary 76 22 100 % -- --     SpO2: 100 % (06/29/2022  9:00 AM)  O2 Device: Vent (06/29/2022  8:45  AM)  FiO2: 30 % (06/29/2022  9:00 AM)  Oximetry Probe Site Changed: No (06/29/2022  8:45 AM)      Weight:   Recent Weights for the past 720 hrs (Last 3 readings):   Weight   06/29/22 0630 91.3 kg (201 lb 4.5 oz)   06/28/22 0630 87.1 kg (192 lb)           Labs:   Latest Reference Range & Units 06/29/22 04:06 06/29/22 04:29 06/29/22 09:04   WBC 3.10 - 9.50 x10 3/uL 20.78 (H)  21.92 (H)   Hemoglobin 11.4 - 14.8 g/dL 7.1 (L)  7.4 (L)   Hematocrit 34.7 - 43.7 % 21.0 (L)  22.0 (L)   Platelet Count 142 - 346 x10 3/uL 136 (L)     Glucose 70 - 100 mg/dL 213 (H)     BUN 7 - 21 mg/dL 27 (H)     Creatinine 0.4 - 1.0 mg/dL 6.7 (H)     Sodium 086 - 145 mEq/L 134 (L)     EGFR >=60.0 mL/min/1.73 m2 5.6 (L)     Calcium Ionized 2.23 - 2.56 mEq/L  2.16 (L)    Phosphorus 2.3 - 4.7 mg/dL 7.1 (H)     Alkaline Phosphatase 37 - 117 U/L 34 (L)     Albumin 3.5 - 5.0 g/dL 2.9 (L)     Protein Total 6.0 - 8.3 g/dL 4.4 (L)     Globulin 2.0 - 3.6 g/dL 1.5 (L)     PT 57.8 - 46.9 sec 13.6 (H)  13.0 (H)   PTT 27 - 39 sec 27  26 (L)          Latest Reference Range & Units 06/29/22 11:35   WBC 3.10 - 9.50 x10 3/uL 18.82 (H)   Hemoglobin 11.4 - 14.8 g/dL 8.5 (L)   Hematocrit 62.9 - 43.7 % 25.4 (L)   Platelet Count 142 - 346 x10 3/uL 138 (L)   Glucose 70 - 100 mg/dL 528 (H)   BUN 7 - 21 mg/dL 28 (H)   Creatinine 0.4 - 1.0 mg/dL 6.9 (H)   EGFR >=41.3 KG/MWN/0.27 m2 5.4 (L)   Phosphorus 2.3 - 4.7 mg/dL 6.9 (H)   Albumin 3.5 -  5.0 g/dL 2.5 (L)   PTT 27 - 39 sec 26 (L)           Medications:  Current Facility-Administered Medications   Medication Dose Route Frequency    piperacillin-tazobactam  4.5 g Intravenous Q8H      insulin lispro injection 2-10 Units  Dose: 2-10 Units  Freq: Every 4 hours scheduled Route: SC  Start: 06/29/22 0400 End: 06/29/22 0608    04255   0608-D/C'd                    One Time Medications:  albumin human 5 % 12.5 g  Dose: 12.5 g  Freq: Once Route: IV  Last Dose: Stopped (06/29/22 0236)  Start: 06/29/22 0200 End: 06/29/22 0236    Order specific questions:       84696   29528                  calcium gluconate 1 g in 50 mL premix  Dose: 1 g  Freq: Once Route: IV  Last Dose: Stopped (06/29/22 0657)  Start: 06/29/22 0600 End: 06/29/22 0657    41324   40102                   insulin regular (HumuLIN R) 3 Units intravenous injection  Dose: 3 Units  Freq: Once Route: IV  Start: 06/29/22 0645 End: 06/29/22 7253    66440                   magnesium sulfate 1g in dextrose 5% IVPB (premix)  Dose: 1 g  Freq: Every 1 hour Route: IV  Last Dose: Stopped (06/29/22 0339)  Start: 06/29/22 0200 End: 06/29/22 0339    34742   595638   756433   295188                  Continuous IV Infusions:   sodium chloride 25 mL/hr at 06/29/22 0800    fentaNYL 50 mcg/hr (06/29/22 0900)    insulin regular 3 Units/hr (06/29/22 0900)    norepinephrine 17 mcg/min (06/29/22 0900)    propofol 10 mcg/kg/min (06/29/22 0900)           PRN Medications:  And  fentaNYL (SUBLIMAZE) bolus from bag 50 mcg  Dose: 50 mcg  Freq: Every 15 min PRN Route: IV  PRN Reason: Pain  PRN Comment: CPOT above 2, while on infusion.  Start: 06/28/22 1548    416606   301601                              Doree Albee RN, BSN  UR Case Manager  [facility]  [email or web address]  Phone: [phone]  Fax: [phone]  12:11 PM  06/29/2022            UTILIZATION REVIEW CONTACT: Name:  Doree Albee, RN BSN  Utilization Review  [facility]  Address:  [address] [place], [place] [address]  NPI:   [id number]  Tax ID:  [id number]  Phone: [phone], Option 2  Fax:  [phone]    Please use fax number [phone] to provide authorization for hospital services or to request additional information.        NOTES TO REVIEWER:    This clinical review is based on/compiled from documentation provided by the treatment team within the patient's medical record.

## 2022-06-29 NOTE — Progress Notes (Signed)
@   0830: 1 unit of PRVC ordered, blood received, however, could not scanned @ epic as it shows, unit number is not matched to the scanned  number in the bag, verified w/ blood bank order to use downtime documentation form. No pre transfusion medication given, Blood started @ 0830, and stopped @ 1100, no sign of blood transfusion reaction. Verified w/ Julieanne Cotton RN.

## 2022-06-29 NOTE — Progress Notes (Signed)
[facility] PROGRESS NOTE      Date of Note: 06/29/2022   Patient Name: Shelby Barron     Date of Birth:  1940/03/02     MRN:               [id number]     PCP:               Patsy Lager, MD     I am available on epic chart and Spectra link extension [phone] Monday - Friday 8:30-16:30. If urology consultation is needed outside these hours please contact [facility] at [phone].      ASSESSMENT/  PLAN     82 yo female with hx of renal failure, renal tumor, bladder cancer.    -medical management per primary, nephrology, general surgery    -possibly return to OR tomorrow with general surgery for abdominal wall closure     I will discuss/discussed the plan of care with the following services:  Dr. Frederica Kuster, Urology   Daughter at bedside   Nursing at bedside     I spent a total of 20 minutes involved in this patient's care    Sheldon Silvan, PA  06/29/2022, 2:06 PM    SUBJECTIVE     Interval History:     6/11: in ICU requiring norepinephrine, intubated and sedated      6/10: Radical cystectomy, bilateral  open radical nephrectomies, general surgery consulted intra-op for enterotomy and small bowel resection, patient left in discontinuity as she developed acute blood loss requiring pressors. EBL: 1L.       Patient Active Problem List    Diagnosis Date Noted    Bladder cancer 06/28/2022    Malignant neoplasm of urinary bladder, unspecified site 06/25/2022    Malignant tumor of kidney, left 06/25/2022    GERD (gastroesophageal reflux disease) 06/25/2022    Leukocytosis 06/25/2022    Chronic diastolic congestive heart failure 06/23/2022    Aortic atherosclerosis 06/23/2022    Peritonitis 05/26/2022    ESRD (end stage renal disease) 05/26/2022    Hamstring injury 05/10/2013    Benign essential hypertension 02/10/2013    Left renal mass 11/28/2012    Type 2 diabetes mellitus with kidney complication, with long-term current use of insulin 07/31/2010    Diabetes mellitus type II, uncontrolled 07/31/2010    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 No retractions or increased work of breathing.   Abdomen: non-distended,    LABS & IMAGING     CBC  Recent Labs     06/29/22  1135 06/29/22  0904 06/29/22  0406   WBC 18.82* 21.92* 20.78*   RBC 2.85* 2.45* 2.31*   Hematocrit 25.4* 22.0* 21.0*   MCV 89.1 89.8 90.9   MCH  29.8 30.2 30.7   MCHC 33.5 33.6 33.8   RDW 15 15 15    MPV 11.1 11.2 11.3       BMP  Recent Labs     06/29/22  1135 06/29/22  0406 06/28/22  2250   CO2 18 18 15*   Anion Gap 15.0 14.0 18.0*   BUN 28* 27* 26*   Creatinine 6.9* 6.7* 6.5*       INR  Lab Results   Component Value Date/Time    INR 1.1 06/29/2022 11:35 AM    INR 1.1 06/01/2022 03:15 AM         IMAGING:  XR Chest AP Portable   Final Result    Right mainstem bronchial intubation. The tube should be   withdrawn about 4 cm.      Dr. Tildon Husky is aware.      Wynema Birch, MD   06/28/2022 4:56 PM      XR OR MISCOUNT   Final Result    No unexpected foreign body.      These critical results were discussed with and acknowledged by Angie R.,   the circulating nurse, who will communicate the findings to Everardo All,   MD on 06/28/2022 3:02 PM.         Bosie Helper, MD   06/28/2022 3:03 PM

## 2022-06-29 NOTE — Progress Notes (Signed)
Round w/ Dr. Tildon Husky, ordered to follow the cuff BP, instead of A line, and titration of levophed has been done accordingly w/ goal  MAP >65 on cuff pressor.

## 2022-06-30 ENCOUNTER — Inpatient Hospital Stay: Payer: No Typology Code available for payment source | Admitting: Anesthesiology

## 2022-06-30 ENCOUNTER — Inpatient Hospital Stay: Payer: No Typology Code available for payment source

## 2022-06-30 ENCOUNTER — Encounter
Admission: RE | Disposition: A | Discharge status: Skilled Nursing Facility | Payer: Self-pay | Source: Home / Self Care | Attending: Student in an Organized Health Care Education/Training Program

## 2022-06-30 DIAGNOSIS — J449 Chronic obstructive pulmonary disease, unspecified: Secondary | ICD-10-CM

## 2022-06-30 DIAGNOSIS — D649 Anemia, unspecified: Secondary | ICD-10-CM

## 2022-06-30 DIAGNOSIS — Z481 Encounter for planned postprocedural wound closure: Secondary | ICD-10-CM

## 2022-06-30 HISTORY — PX: EXPLORATORY LAPAROTOMY: SHX4079

## 2022-06-30 HISTORY — PX: WOUND VAC APPLICATION: SHX510117

## 2022-06-30 LAB — LAB USE ONLY - CBC WITH DIFFERENTIAL
Absolute Basophils: 0.02 10*3/uL (ref 0.00–0.08)
Absolute Basophils: 0.03 10*3/uL (ref 0.00–0.08)
Absolute Basophils: 0.05 10*3/uL (ref 0.00–0.08)
Absolute Basophils: 0.05 10*3/uL (ref 0.00–0.08)
Absolute Eosinophils: 0.06 10*3/uL (ref 0.00–0.44)
Absolute Eosinophils: 0.03 10*3/uL (ref 0.00–0.44)
Absolute Eosinophils: 0.04 10*3/uL (ref 0.00–0.44)
Absolute Eosinophils: 0.01 10*3/uL (ref 0.00–0.44)
Absolute Immature Granulocytes: 0.2 10*3/uL — ABNORMAL HIGH (ref 0.00–0.07)
Absolute Immature Granulocytes: 0.38 10*3/uL — ABNORMAL HIGH (ref 0.00–0.07)
Absolute Immature Granulocytes: 0.5 10*3/uL — ABNORMAL HIGH (ref 0.00–0.07)
Absolute Immature Granulocytes: 0.45 10*3/uL — ABNORMAL HIGH (ref 0.00–0.07)
Absolute Lymphocytes: 1.12 10*3/uL (ref 0.42–3.22)
Absolute Lymphocytes: 0.91 10*3/uL (ref 0.42–3.22)
Absolute Lymphocytes: 1.23 10*3/uL (ref 0.42–3.22)
Absolute Lymphocytes: 1.53 10*3/uL (ref 0.42–3.22)
Absolute Monocytes: 1.19 10*3/uL — ABNORMAL HIGH (ref 0.21–0.85)
Absolute Monocytes: 1.14 10*3/uL — ABNORMAL HIGH (ref 0.21–0.85)
Absolute Monocytes: 1.64 10*3/uL — ABNORMAL HIGH (ref 0.21–0.85)
Absolute Monocytes: 1.71 10*3/uL — ABNORMAL HIGH (ref 0.21–0.85)
Absolute Neutrophils: 12.88 10*3/uL — ABNORMAL HIGH (ref 1.10–6.33)
Absolute Neutrophils: 16.9 10*3/uL — ABNORMAL HIGH (ref 1.10–6.33)
Absolute Neutrophils: 22.98 10*3/uL — ABNORMAL HIGH (ref 1.10–6.33)
Absolute Neutrophils: 21.52 10*3/uL — ABNORMAL HIGH (ref 1.10–6.33)
Absolute nRBC: 0 10*3/uL (ref <=0.00)
Absolute nRBC: 0.03 10*3/uL — ABNORMAL HIGH (ref <=0.00)
Absolute nRBC: 0.04 10*3/uL — ABNORMAL HIGH (ref <=0.00)
Absolute nRBC: 0.04 10*3/uL — ABNORMAL HIGH (ref <=0.00)
Basophils %: 0.1 %
Basophils %: 0.2 %
Basophils %: 0.2 %
Basophils %: 0.2 %
Eosinophils %: 0.4 %
Eosinophils %: 0.2 %
Eosinophils %: 0.2 %
Eosinophils %: 0 %
Hematocrit: 20.1 % — ABNORMAL LOW (ref 34.7–43.7)
Hematocrit: 25.8 % — ABNORMAL LOW (ref 34.7–43.7)
Hematocrit: 26.1 % — ABNORMAL LOW (ref 34.7–43.7)
Hematocrit: 25.3 % — ABNORMAL LOW (ref 34.7–43.7)
Hemoglobin: 6.7 g/dL — ABNORMAL LOW (ref 11.4–14.8)
Hemoglobin: 8.7 g/dL — ABNORMAL LOW (ref 11.4–14.8)
Hemoglobin: 8.8 g/dL — ABNORMAL LOW (ref 11.4–14.8)
Hemoglobin: 8.7 g/dL — ABNORMAL LOW (ref 11.4–14.8)
Immature Granulocytes %: 1.3 %
Immature Granulocytes %: 2 %
Immature Granulocytes %: 1.9 %
Immature Granulocytes %: 1.8 %
Lymphocytes %: 7.2 %
Lymphocytes %: 4.7 %
Lymphocytes %: 4.7 %
Lymphocytes %: 6.1 %
MCH: 29.8 pg (ref 25.1–33.5)
MCH: 30 pg (ref 25.1–33.5)
MCH: 30.2 pg (ref 25.1–33.5)
MCH: 30.4 pg (ref 25.1–33.5)
MCHC: 33.3 g/dL (ref 31.5–35.8)
MCHC: 33.7 g/dL (ref 31.5–35.8)
MCHC: 33.7 g/dL (ref 31.5–35.8)
MCHC: 34.4 g/dL (ref 31.5–35.8)
MCV: 89.3 fL (ref 78.0–96.0)
MCV: 89 fL (ref 78.0–96.0)
MCV: 89.7 fL (ref 78.0–96.0)
MCV: 88.5 fL (ref 78.0–96.0)
MPV: 10.4 fL (ref 8.9–12.5)
MPV: 10.9 fL (ref 8.9–12.5)
MPV: 11.1 fL (ref 8.9–12.5)
MPV: 11.1 fL (ref 8.9–12.5)
Monocytes %: 7.7 %
Monocytes %: 5.9 %
Monocytes %: 6.2 %
Monocytes %: 6.8 %
Neutrophils %: 83.3 %
Neutrophils %: 87 %
Neutrophils %: 86.8 %
Neutrophils %: 85.1 %
Platelet Count: 97 10*3/uL — ABNORMAL LOW (ref 142–346)
Platelet Count: 100 10*3/uL — ABNORMAL LOW (ref 142–346)
Platelet Count: 113 10*3/uL — ABNORMAL LOW (ref 142–346)
Platelet Count: 105 10*3/uL — ABNORMAL LOW (ref 142–346)
Preliminary Absolute Neutrophil Count: 12.88 10*3/uL — ABNORMAL HIGH (ref 1.10–6.33)
Preliminary Absolute Neutrophil Count: 16.9 10*3/uL — ABNORMAL HIGH (ref 1.10–6.33)
Preliminary Absolute Neutrophil Count: 22.98 10*3/uL — ABNORMAL HIGH (ref 1.10–6.33)
Preliminary Absolute Neutrophil Count: 21.52 10*3/uL — ABNORMAL HIGH (ref 1.10–6.33)
RBC: 2.25 10*6/uL — ABNORMAL LOW (ref 3.90–5.10)
RBC: 2.9 10*6/uL — ABNORMAL LOW (ref 3.90–5.10)
RBC: 2.91 10*6/uL — ABNORMAL LOW (ref 3.90–5.10)
RBC: 2.86 10*6/uL — ABNORMAL LOW (ref 3.90–5.10)
RDW: 15 % (ref 11–15)
RDW: 15 % (ref 11–15)
RDW: 15 % (ref 11–15)
RDW: 15 % (ref 11–15)
WBC: 15.47 10*3/uL — ABNORMAL HIGH (ref 3.10–9.50)
WBC: 19.39 10*3/uL — ABNORMAL HIGH (ref 3.10–9.50)
WBC: 26.44 10*3/uL — ABNORMAL HIGH (ref 3.10–9.50)
WBC: 25.27 10*3/uL — ABNORMAL HIGH (ref 3.10–9.50)
nRBC %: 0 /100 WBC (ref <=0.0)
nRBC %: 0.2 /100 WBC — ABNORMAL HIGH (ref <=0.0)
nRBC %: 0.2 /100 WBC — ABNORMAL HIGH (ref <=0.0)
nRBC %: 0.2 /100 WBC — ABNORMAL HIGH (ref <=0.0)

## 2022-06-30 LAB — RENAL FUNCTION PANEL
Albumin: 1.7 g/dL — ABNORMAL LOW (ref 3.5–5.0)
Albumin: 1.8 g/dL — ABNORMAL LOW (ref 3.5–5.0)
Albumin: 1.6 g/dL — ABNORMAL LOW (ref 3.5–5.0)
Anion Gap: 11 (ref 5.0–15.0)
Anion Gap: 12 (ref 5.0–15.0)
Anion Gap: 10 (ref 5.0–15.0)
BUN: 23 mg/dL — ABNORMAL HIGH (ref 7–21)
BUN: 26 mg/dL — ABNORMAL HIGH (ref 7–21)
BUN: 21 mg/dL (ref 7–21)
CO2: 17 mEq/L (ref 17–29)
CO2: 16 mEq/L — ABNORMAL LOW (ref 17–29)
CO2: 20 mEq/L (ref 17–29)
Calcium: 9.2 mg/dL (ref 7.9–10.2)
Calcium: 9.2 mg/dL (ref 7.9–10.2)
Calcium: 9.3 mg/dL (ref 7.9–10.2)
Chloride: 108 mEq/L (ref 99–111)
Chloride: 107 mEq/L (ref 99–111)
Chloride: 107 mEq/L (ref 99–111)
Creatinine: 4.6 mg/dL — ABNORMAL HIGH (ref 0.4–1.0)
Creatinine: 4.9 mg/dL — ABNORMAL HIGH (ref 0.4–1.0)
Creatinine: 3.6 mg/dL — ABNORMAL HIGH (ref 0.4–1.0)
GFR: 8.8 mL/min/{1.73_m2} — ABNORMAL LOW (ref >=60.0)
GFR: 8.2 mL/min/{1.73_m2} — ABNORMAL LOW (ref >=60.0)
GFR: 11.9 mL/min/{1.73_m2} — ABNORMAL LOW (ref >=60.0)
Glucose: 185 mg/dL — ABNORMAL HIGH (ref 70–100)
Glucose: 222 mg/dL — ABNORMAL HIGH (ref 70–100)
Glucose: 158 mg/dL — ABNORMAL HIGH (ref 70–100)
Phosphorus: 6 mg/dL — ABNORMAL HIGH (ref 2.3–4.7)
Phosphorus: 5.9 mg/dL — ABNORMAL HIGH (ref 2.3–4.7)
Phosphorus: 4 mg/dL (ref 2.3–4.7)
Potassium: 5.4 mEq/L — ABNORMAL HIGH (ref 3.5–5.3)
Potassium: 5.6 mEq/L — ABNORMAL HIGH (ref 3.5–5.3)
Potassium: 4.9 mEq/L (ref 3.5–5.3)
Sodium: 136 mEq/L (ref 135–145)
Sodium: 135 mEq/L (ref 135–145)
Sodium: 137 mEq/L (ref 135–145)

## 2022-06-30 LAB — PT AND APTT
INR: 1.1 (ref 0.9–1.1)
INR: 1.1 (ref 0.9–1.1)
INR: 1.1 (ref 0.9–1.1)
INR: 1.1 (ref 0.9–1.1)
PT: 13.1 s — ABNORMAL HIGH (ref 10.1–12.9)
PT: 13.1 s — ABNORMAL HIGH (ref 10.1–12.9)
PT: 12.9 s (ref 10.1–12.9)
PT: 12.3 s (ref 10.1–12.9)
PTT: 31 s (ref 27–39)
PTT: 28 s (ref 27–39)
PTT: 27 s (ref 27–39)
PTT: 27 s (ref 27–39)

## 2022-06-30 LAB — COMPREHENSIVE METABOLIC PANEL
ALT: 19 U/L (ref 0–55)
AST (SGOT): 37 U/L (ref 5–41)
Albumin/Globulin Ratio: 1 (ref 0.9–2.2)
Albumin: 1.8 g/dL — ABNORMAL LOW (ref 3.5–5.0)
Alkaline Phosphatase: 47 U/L (ref 37–117)
Anion Gap: 8 (ref 5.0–15.0)
BUN: 20 mg/dL (ref 7–21)
Bilirubin, Total: 0.4 mg/dL (ref 0.2–1.2)
CO2: 21 mEq/L (ref 17–29)
Calcium: 9.6 mg/dL (ref 7.9–10.2)
Chloride: 107 mEq/L (ref 99–111)
Creatinine: 4.1 mg/dL — ABNORMAL HIGH (ref 0.4–1.0)
GFR: 10.2 mL/min/{1.73_m2} — ABNORMAL LOW (ref >=60.0)
Globulin: 1.8 g/dL — ABNORMAL LOW (ref 2.0–3.6)
Glucose: 144 mg/dL — ABNORMAL HIGH (ref 70–100)
Potassium: 5 mEq/L (ref 3.5–5.3)
Protein, Total: 3.6 g/dL — ABNORMAL LOW (ref 6.0–8.3)
Sodium: 136 mEq/L (ref 135–145)

## 2022-06-30 LAB — ECG 12-LEAD
Atrial Rate: 110 {beats}/min
P Axis: 69 degrees
P-R Interval: 148 ms
Q-T Interval: 354 ms
QRS Duration: 68 ms
QTC Calculation (Bezet): 479 ms
R Axis: 35 degrees
T Axis: 70 degrees
Ventricular Rate: 110 {beats}/min

## 2022-06-30 LAB — WHOLE BLOOD GLUCOSE POCT
Whole Blood Glucose POCT: 115 mg/dL — ABNORMAL HIGH (ref 70–100)
Whole Blood Glucose POCT: 135 mg/dL — ABNORMAL HIGH (ref 70–100)
Whole Blood Glucose POCT: 145 mg/dL — ABNORMAL HIGH (ref 70–100)
Whole Blood Glucose POCT: 156 mg/dL — ABNORMAL HIGH (ref 70–100)
Whole Blood Glucose POCT: 170 mg/dL — ABNORMAL HIGH (ref 70–100)
Whole Blood Glucose POCT: 203 mg/dL — ABNORMAL HIGH (ref 70–100)
Whole Blood Glucose POCT: 224 mg/dL — ABNORMAL HIGH (ref 70–100)

## 2022-06-30 LAB — MAGNESIUM
Magnesium: 2.2 mg/dL (ref 1.6–2.6)
Magnesium: 2.3 mg/dL (ref 1.6–2.6)
Magnesium: 2.4 mg/dL (ref 1.6–2.6)
Magnesium: 2.5 mg/dL (ref 1.6–2.6)

## 2022-06-30 LAB — RECTAL SWAB CARBAPENEMASE SURVEILLANCE, PCR
IMP gene sequence: NOT DETECTED
KPC gene sequence: NOT DETECTED
NDM gene sequence: NOT DETECTED
OXA-48 gene sequence: NOT DETECTED
VIM gene sequence: NOT DETECTED

## 2022-06-30 LAB — CALCIUM, IONIZED
Calcium, Ionized: 2.71 mEq/L — ABNORMAL HIGH (ref 2.23–2.56)
Calcium, Ionized: 2.77 mEq/L — ABNORMAL HIGH (ref 2.23–2.56)
Calcium, Ionized: 2.64 mEq/L — ABNORMAL HIGH (ref 2.23–2.56)
Calcium, Ionized: 2.56 mEq/L (ref 2.23–2.56)
Calcium, Ionized: 2.73 mEq/L — ABNORMAL HIGH (ref 2.23–2.56)

## 2022-06-30 LAB — PHOSPHORUS: Phosphorus: 4.7 mg/dL (ref 2.3–4.7)

## 2022-06-30 SURGERY — LAPAROTOMY, EXPLORATORY
Anesthesia: Anesthesia General | Site: Abdomen | Wound class: Contaminated

## 2022-06-30 MED ORDER — ONDANSETRON HCL 4 MG/2ML IJ SOLN
INTRAMUSCULAR | Status: AC
Start: 2022-06-30 — End: ?
  Filled 2022-06-30: qty 2

## 2022-06-30 MED ORDER — SODIUM CHLORIDE 0.9 % IV SOLN
100.0000 mg | INTRAVENOUS | Status: DC
Start: 2022-06-30 — End: 2022-07-01
  Administered 2022-06-30: 100 mg via INTRAVENOUS
  Filled 2022-06-30 (×2): qty 5

## 2022-06-30 MED ORDER — NEOSTIGMINE METHYLSULFATE 1 MG/ML IJ/IV SOLN (WRAP)
Status: DC | PRN
Start: 2022-06-30 — End: 2022-06-30
  Administered 2022-06-30: 2 mg via INTRAVENOUS

## 2022-06-30 MED ORDER — KETAMINE HCL 10 MG/ML IJ/IV SOLN (WRAP)
Status: AC
Start: 2022-06-30 — End: ?
  Filled 2022-06-30: qty 5

## 2022-06-30 MED ORDER — FENTANYL CITRATE (PF) 50 MCG/ML IJ SOLN (WRAP)
INTRAMUSCULAR | Status: DC | PRN
Start: 2022-06-30 — End: 2022-06-30
  Administered 2022-06-30: 50 ug via INTRAVENOUS
  Administered 2022-06-30: 100 ug via INTRAVENOUS
  Administered 2022-06-30: 50 ug via INTRAVENOUS

## 2022-06-30 MED ORDER — SODIUM CHLORIDE 0.9 % IV SOLN
INTRAVENOUS | Status: DC | PRN
Start: 2022-06-30 — End: 2022-06-30

## 2022-06-30 MED ORDER — FENTANYL CITRATE (PF) 50 MCG/ML IJ SOLN (WRAP)
INTRAMUSCULAR | Status: AC
Start: 2022-06-30 — End: ?
  Filled 2022-06-30: qty 2

## 2022-06-30 MED ORDER — SODIUM CHLORIDE 0.9% IJ/IV SOLN (WRAP)
500.0000 [IU]/h | INTRAMUSCULAR | Status: DC
Start: 2022-06-30 — End: 2022-07-03
  Administered 2022-06-30 – 2022-07-02 (×6): 500 [IU]/h via INTRAVENOUS
  Filled 2022-06-30 (×8): qty 2

## 2022-06-30 MED ORDER — ONDANSETRON HCL 4 MG/2ML IJ SOLN
INTRAMUSCULAR | Status: DC | PRN
Start: 2022-06-30 — End: 2022-06-30
  Administered 2022-06-30: 4 mg via INTRAVENOUS

## 2022-06-30 MED ORDER — KETAMINE HCL 50 MG/ML IJ/IV SOLN (WRAP)
Status: DC | PRN
Start: 2022-06-30 — End: 2022-06-30
  Administered 2022-06-30: 50 mg via INTRAVENOUS

## 2022-06-30 MED ORDER — DEXAMETHASONE SODIUM PHOSPHATE 4 MG/ML IJ SOLN (WRAP)
INTRAMUSCULAR | Status: DC | PRN
Start: 2022-06-30 — End: 2022-06-30
  Administered 2022-06-30: 4 mg via INTRAVENOUS

## 2022-06-30 MED ORDER — GLYCOPYRROLATE 0.2 MG/ML IJ SOLN (WRAP)
INTRAMUSCULAR | Status: DC | PRN
Start: 2022-06-30 — End: 2022-06-30
  Administered 2022-06-30: .4 mg via INTRAVENOUS

## 2022-06-30 MED ORDER — ROCURONIUM BROMIDE 10 MG/ML IV SOLN (WRAP)
INTRAVENOUS | Status: DC | PRN
Start: 2022-06-30 — End: 2022-06-30
  Administered 2022-06-30: 50 mg via INTRAVENOUS

## 2022-06-30 SURGICAL SUPPLY — 92 items
ADHESIVE SKIN CLOSURE DERMABOND ADVANCED (Skin Closure) ×2 IMPLANT
ADHESIVE SKIN CLOSURE DERMABOND ADVANCED .7 ML LIQUID APPLICATOR (Skin Closure) ×2 IMPLANT
BARRIER ADH SOD HALUR CMC SPRFLM 6X5IN (Sheet) ×4 IMPLANT
BARRIER ADHESION L6 IN X W5 IN SEPRAFILM SODIUM HYALURONATE (Sheet) ×4 IMPLANT
BLADE 10 CLASSIC CARBON STEEL SURGICAL (Blade) ×2 IMPLANT
BLADE 15 BARD-PARKER RIB-BACK SAFETYLOCK (Blade) IMPLANT
BLADE 15 BARD-PARKER RIB-BACK SAFETYLOCK CARBON STEEL SURGICAL (Blade) IMPLANT
BULB DRAINAGE LIGHTWEIGHT LOW LEVEL (Drain) IMPLANT
BULB DRAINAGE LIGHTWEIGHT LOW LEVEL SUCTION RELIAVAC SILICONE 100 CC (Drain) IMPLANT
DRAIN OD19 FR RADIOPAQUE 4 FREE FLOW (Drain) IMPLANT
DRAIN OD19 FR RADIOPAQUE 4 FREE FLOW CHANNEL TROCAR CHANNEL DRAIN L1/4 (Drain) IMPLANT
DRAIN RADIOPAQUE 4 FREE FLOW CHANNEL (Drain) IMPLANT
DRAIN RADIOPAQUE 4 FREE FLOW CHANNEL BARD L3/8 IN INCISION SILICONE (Drain) IMPLANT
DRESSING TRANSPARENT L4 3/4 IN X W4 IN (Dressing) ×2 IMPLANT
DRESSING TRANSPARENT L4 3/4 IN X W4 IN POLYURETHANE ADHESIVE (Dressing) ×2 IMPLANT
DRESSING WOUND TELFA L8 IN X W3 IN (Dressing) ×2 IMPLANT
DRESSING WOUND TELFA L8 IN X W3 IN ISLAND HYPOALLERGENIC NONADHERENT (Dressing) ×2 IMPLANT
ELECTRODE ADULT PATIENT RETURN L9 FT REM POLYHESIVE ACRYLIC FOAM (Procedure Accessories) ×2 IMPLANT
ELECTRODE ELECTROSURGICAL BLADE L6.5 IN (Cautery) ×2 IMPLANT
ELECTRODE ELECTROSURGICAL BLADE L6.5 IN OD3/32 IN VALLEYLAB STAINLESS (Cautery) ×2 IMPLANT
ELECTRODE PATIENT RETURN L9 FT VALLEYLAB (Procedure Accessories) ×2 IMPLANT
GOWN SURGICAL XL SMARTGOWN LEVEL 4 (Gown) ×4 IMPLANT
GOWN SURGICAL XL SMARTGOWN LEVEL 4 BREATHABLE (Gown) ×4 IMPLANT
HANDPIECE SCT PL LF STRL DISP (Procedure Accessories) ×2 IMPLANT
HANDPIECE SUCTION POOLE SUMP ACTION PINPOINT SUCTION MEDI- VAC (Procedure Accessories) ×2 IMPLANT
HEMOSTAT ABSORBABLE POWDER SURGICEL (Procedure Accessories) ×2 IMPLANT
HEMOSTAT ABSORBABLE POWDER SURGICEL OXIDIZED REGENERATED CELLULOSE (Procedure Accessories) ×2 IMPLANT
KIT DRESSING L17 CM X W14.5 CM THK1.75 (Dressing) ×2 IMPLANT
KIT DRESSING L17 CM X W14.5 CM THK1.75 CM MEDIUM VAC SIMPLACE (Dressing) ×2 IMPLANT
KIT RM TURNOVER LF NS DISP (Kits) ×2 IMPLANT
KIT ROOM TURNOVER NONSTERILE LATEX FREE DISPOSABLE (Kits) ×2 IMPLANT
KIT SURGICAL BASIN 2 FOAK (Procedure Accessories) ×2 IMPLANT
KIT SURGICAL BASIN 2 FOAK MEDLINE INDUSTRIES, INC. (Procedure Accessories) ×2 IMPLANT
PAD ABDOMINAL L9 IN X W5 IN ABSORBENT (Procedure Accessories) IMPLANT
PAD ABDOMINAL L9 IN X W5 IN ABSORBENT NONWOVEN HYDROPHOBIC BACK SEAL (Procedure Accessories) IMPLANT
PENCIL SMOKE EVACUATOR COATED PUSH (Cautery) ×2 IMPLANT
PENCIL SMOKE EVACUATOR COATED PUSH BUTTON NEPTUNE E-SEP (Cautery) ×2 IMPLANT
RELOAD STAPLER 3 MM 3.5 MM 4 MM L45 MM (Staplers) IMPLANT
RELOAD STAPLER 3 MM 3.5 MM 4 MM L45 MM ENDO GIA TITANIUM MEDIUM THICK (Staplers) IMPLANT
RELOAD STAPLER 3 MM 3.5 MM 4 MM L60 MM (Staplers) ×2 IMPLANT
RELOAD STAPLER 3 MM 3.5 MM 4 MM L60 MM ENDO GIA TITANIUM MEDIUM THICK (Staplers) ×2 IMPLANT
RELOAD STAPLER 40 MM H4.7 MM ECHELON (Staplers) IMPLANT
RELOAD STAPLER L60 MM ECHELON ENDOPATH (Staplers) ×2 IMPLANT
RELOAD STAPLER L60 MM ECHELON ENDOPATH REGULAR TISSUE 6 ROW BLUE (Staplers) ×2 IMPLANT
RELOAD STAPLER L60 MM X H3.8 MM GIA (Staplers) IMPLANT
RELOAD STAPLER L60 MM X H3.8 MM GIA TITANIUM REGULAR TISSUE 4 ROW (Staplers) IMPLANT
SEALER/DIVIDER ELECTROSURGICAL L18 CM 14 D 180 D L34 MM CURVE JAW OVAL (Instrument) ×2 IMPLANT
SEALER/DIVIDER ESURG 14D 180D 34MM LGSR (Instrument) ×2 IMPLANT
SPONGE LAPAROTOMY L18 IN X W18 IN (Sponge) ×2 IMPLANT
SPONGE LAPAROTOMY L18 IN X W18 IN PREWASH WHITE (Sponge) ×2 IMPLANT
STAPLER INTNL TI CRV CNTR 40MMX3.5MM LF (Staplers) IMPLANT
STAPLER L340 MM LINEAR CUTTER ECHELON (Staplers) ×2 IMPLANT
STAPLER L340 MM LINEAR CUTTER ECHELON FLEXâ„¢ GST INTERNAL STANDARD L60 (Staplers) ×2 IMPLANT
STAPLER REGULAR TISSUE L40 MM X H3.5 MM 4 ROW LINEAR CUTTER CONTOUR (Staplers) IMPLANT
STAPLER REGULAR TISSUE L60 MM X H4.8 MM (Staplers) IMPLANT
STAPLER REGULAR TISSUE L60 MM X H4.8 MM ENDOSCOPIC 2 ROW LINEAR CUTTER (Staplers) IMPLANT
STAPLER REGULAR TISSUE L80 MM X H4.8 MM (Staplers) IMPLANT
STAPLER REGULAR TISSUE L80 MM X H4.8 MM ENDOSCOPIC 4 ROW LINEAR CUT (Staplers) IMPLANT
STAPLER SKIN W4.8 MM X H3.4 MM 35 WIDE (Staplers) ×2 IMPLANT
STAPLER THICK TISSUE L60 MM X H3.5 MM (Staplers) ×2 IMPLANT
STAPLER THICK TISSUE L60 MM X H3.5 MM ENDOSCOPIC 2 ROW LINEAR CUTTER 2 (Staplers) ×2 IMPLANT
STAPLER THIN VASCULAR TISSUE L6 CM X W4 (Staplers) ×2 IMPLANT
STAPLER THIN VASCULAR TISSUE L6 CM X W4 MM OD12 MM ENDOSCOPIC (Staplers) ×2 IMPLANT
SUTURE COATED VICRYL 2-0 CT-1 L36 IN (Suture) IMPLANT
SUTURE COATED VICRYL 2-0 CT-1 L36 IN BRAID COATED UNDYED ABSORBABLE (Suture) IMPLANT
SUTURE COATED VICRYL 2-0 SH L27 IN BRAID (Suture) IMPLANT
SUTURE COATED VICRYL 2-0 SH L27 IN BRAID COATED UNDYED ABSORBABLE (Suture) IMPLANT
SUTURE COATED VICRYL PLUS 3-0 SH L27 IN (Suture) ×16 IMPLANT
SUTURE COATED VICRYL PLUS 3-0 SH L27 IN BRAID ANTIBACTERIAL COATED (Suture) ×16 IMPLANT
SUTURE ETHILON BLACK 3-0 PS-2 L18 IN (Suture) IMPLANT
SUTURE ETHILON BLACK 3-0 PS-2 L18 IN MONOFILAMENT NONABSORBABLE (Suture) IMPLANT
SUTURE MONOCRYL 3-0 CT-1 L36 IN (Suture) IMPLANT
SUTURE MONOCRYL 3-0 CT-1 L36 IN MONOFILAMENT UNDYED ABSORBABLE (Suture) IMPLANT
SUTURE PDS II 0 CT-2 L27 IN MONOFILAMENT (Suture) ×8 IMPLANT
SUTURE PDS II 0 CT-2 L27 IN MONOFILAMENT VIOLET ABSORBABLE (Suture) ×8 IMPLANT
SUTURE PDS II 1 TP-1 L96 IN MONOFILAMENT (Suture) IMPLANT
SUTURE PDS II 1 TP-1 L96 IN MONOFILAMENT LOOP VIOLET ABSORBABLE (Suture) IMPLANT
SUTURE SILK PERMA HAND BLACK 0 L18 IN (Suture) ×2 IMPLANT
SUTURE SILK PERMA HAND BLACK 0 L18 IN BRAID TIES 6 STRAND PRECUT (Suture) ×2 IMPLANT
SUTURE SILK PERMA HAND BLACK 2-0 L18 IN (Suture) ×2 IMPLANT
SUTURE SILK PERMA HAND BLACK 2-0 L18 IN BRAID TIES 12 STRAND PRECUT (Suture) ×2 IMPLANT
SUTURE SILK PERMA HAND BLACK 2-0 SH L18 (Suture) IMPLANT
SUTURE SILK PERMA HAND BLACK 2-0 SH L30 (Suture) IMPLANT
SUTURE SILK PERMA HAND BLACK 2-0 SH L30 IN BRAID NONABSORBABLE (Suture) IMPLANT
SUTURE SILK PERMA HAND BLACK 3-0 L18 IN (Suture) ×2 IMPLANT
SUTURE SILK PERMA HAND BLACK 3-0 L18 IN BRAID TIES 12 STRAND PRECUT (Suture) ×2 IMPLANT
SUTURE SILK PERMA HAND BLCK 2-0 SH L18 IN CONTROL BRD 8 STRND NNBSRBBL (Suture) IMPLANT
TOWEL L27 IN X W17 IN COTTON STANDARD (Procedure Accessories) ×2 IMPLANT
TOWEL OR L27 IN X W17 IN STANDARD PREWASH DELINT ABSORBENT COTTON BLUE (Procedure Accessories) ×2 IMPLANT
TRAY CATHETER 1 LAYER FOLEY URINE METER CLOSED SYSTEM PVP SILICONE (Tray) ×2 IMPLANT
TRAY CATHETER MEDLINE 1 LAYER FOLEY (Tray) ×2 IMPLANT
TRAY SURGICAL LAPAROTOMY (Pack) ×2 IMPLANT

## 2022-06-30 NOTE — Anesthesia Preprocedure Evaluation (Addendum)
Anesthesia Evaluation    AIRWAY    Mallampati: unable to assess    Neck ROM: full     CARDIOVASCULAR    cardiovascular exam normal, regular and normal       DENTAL                   PULMONARY    pulmonary exam normal and clear to auscultation     OTHER FINDINGS                                        Relevant Problems   PULMONARY   (+) COPD (chronic obstructive pulmonary disease)      CARDIO   (+) Aortic atherosclerosis   (+) Benign essential hypertension   (+) Chronic diastolic congestive heart failure      GI   (+) GERD (gastroesophageal reflux disease)      GU/RENAL   (+) ESRD (end stage renal disease)   (+) Malignant tumor of kidney, left      ENDO   (+) Type 2 diabetes mellitus with kidney complication, with long-term current use of insulin               Anesthesia Plan    ASA 4     general                     intravenous induction   Detailed anesthesia plan: general endotracheal      Post Op: post-op ventilation  Trial extubation is not planned.    Post op pain management: per surgeon    informed consent obtained    Plan discussed with CRNA.    ECG reviewed  pertinent labs reviewed               Signed by: Maryelizabeth Rowan, MD 06/30/22 8:29 AM

## 2022-06-30 NOTE — Transfer of Care (Signed)
Anesthesia Transfer of Care Note    Patient: Shelby Barron    Procedures performed: Procedure(s) with comments:  SMALL BOWEL ANASTOMOSIS, CLOSURE OF MESENTERIC DEFECT, CLOSURE OF ABDOMINAL WALL - **[id number]/ MD AVAIL [id number] OR AFTER 1600**  WOUND VAC APPLICATION    Anesthesia type: General ETT    Patient location:ICU    Last vitals:   Vitals:    06/30/22 1205   BP: (!) 149/104   Pulse: (!) 138   Resp: 16   Temp: (!) 36 C (96.8 F)   SpO2: 99%       Post pain:      Mental Status:awake and sedated    Respiratory Function: intubated    Cardiovascular: stable    Nausea/Vomiting: patient not complaining of nausea or vomiting    Hydration Status: adequate    Post assessment: no apparent anesthetic complications    Signed by: Maryelizabeth Rowan, MD  06/30/22 12:06 PM

## 2022-06-30 NOTE — Progress Notes (Signed)
[facility] PROGRESS NOTE  Myrla Halsted, x [phone] 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  Plan to resume CRRT once she is back from the OR  3.  If hemodynamics allow, we will plan for HD tomorrow  4.  Replete electrolytes per CRRT protocol   5.  Attempt UF if tolerated       Case discussed with: daughter, ICU RN    Delice Bison, MD, MD  [facility]  [phone] (office)  X [phone] [name] Spectra-Link)      Subjective: remains intubated, on low dose levophed  Plan to RTOR this AM for closure  CRRT filter clotted x3 overnight     Review of Systems:   Unable to assess, pt is sedated    Physical Exam:     Vitals:    06/30/22 0723 06/30/22 0733 06/30/22 0746 06/30/22 0800   BP: (!) 79/44 131/61     Pulse:       Resp:       Temp:    98.6 F (37 C)   TempSrc:    Axillary   SpO2:   98%    Weight:       Height:   1.6 m (5' 2.99")        Intake and Output Summary (Last 24 hours) at Date Time    Intake/Output Summary (Last 24 hours) at 06/30/2022 0900  Last data filed at 06/30/2022 0600  Gross per 24  hour   Intake 1637.94 ml   Output 1568 ml   Net 69.94 ml       General: sedated and intubated  Cardiovascular: regular rate and rhythm  Lungs: bilateral air entry  Abdomen: soft  Extremities: edema noted in her hands    Access: TDC    Meds:      Scheduled Meds: PRN Meds:    insulin lispro, 1-8 Units, Subcutaneous, Q4H SCH  piperacillin-tazobactam, 4.5 g, Intravenous, Q8H          Continuous Infusions:   calcium GLUConate CRRT infusion 40 mL/hr (06/30/22 0600)    fentaNYL 50 mcg/hr (06/30/22 0600)    norepinephrine 2 mcg/min (06/30/22 0733)    prismaSATE 4/2.5 1,500 mL/hr at 06/30/22 0445    prismaSATE 4/2.5 1,000 mL/hr at 06/30/22 0445    propofol 10 mcg/kg/min (06/30/22 0600)    sodium chloride, , PRN  dextrose, 15 g of glucose, PRN   Or  dextrose, 12.5 g, PRN   Or  dextrose, 12.5 g, PRN   Or  glucagon (rDNA), 1 mg, PRN  fentaNYL (PF), 50 mcg, Q15 Min PRN  fentaNYL, 50 mcg, Q15 Min PRN  magnesium sulfate, 1 g, PRN  potassium chloride, 20 mEq, PRN  sodium chloride (PF), 10 mL, PRN  sodium phosphates 20 mmol in dextrose 5 % 250 mL IVPB, 20 mmol, PRN              Labs:     Recent Labs   Lab 06/30/22  0544 06/29/22  2313 06/29/22  1841   WBC 15.47* 15.10* 17.03*   Hemoglobin 6.7* 7.8* 8.3*   Hematocrit 20.1* 22.3* 24.2*   Platelet Count 97* 122* 140*     Recent Labs   Lab 06/30/22  0544 06/29/22  2313 06/29/22  1841   Sodium 136 136 136   Potassium 5.0 4.4 4.7   Chloride 107 106 105   CO2 21 21 19    BUN 20 21 23*   Creatinine 4.1* 4.6* 5.2*   Calcium 9.6 9.7 9.2   Albumin 1.8* 2.0* 2.3*   Phosphorus 4.7 4.8* 5.4*   Magnesium 2.2 2.2 2.2   Glucose 144* 147* 155*   GFR 10.2* 8.8* 7.6*           Signed by: Delice Bison, MD, MD

## 2022-06-30 NOTE — Progress Notes (Signed)
GENERAL SURGERY  PROGRESS NOTE    Date Time: 07/01/22 8:08 AM  Patient Name: [facility] Day: 3    ASSESSMENT:     Patient Active Problem List   Diagnosis    Type 2 diabetes mellitus with kidney complication, with long-term current use of insulin    Diabetes mellitus type II, uncontrolled    Neuropathy of hand    Left renal mass    Hamstring injury    Peritonitis    ESRD (end stage renal disease)    Chronic diastolic congestive heart failure    Aortic atherosclerosis    Malignant neoplasm of urinary bladder, unspecified site    Malignant tumor of kidney, left    Benign essential hypertension    GERD (gastroesophageal reflux disease)    Leukocytosis    Bladder cancer    Anemia    COPD (chronic obstructive pulmonary disease)       SHAMONE SAMMIS is a 82 y.o. female with bladder cancer and renal failure admitted for cystectomy with bilateral nephrectomy complicated by enterotomy requiring small bowel resection in discontinuity and negative pressure temporary abdominal closure given pressure support and need for resuscitation. Returned to the OR yesterday (6/12) for re-anastomosis and abdominal closure.     PLAN:   -- NPO, mIVF  -- Monitor for return of bowel function  -- Pain control   -- Transfuse PRN for anemia  -- Abx: zosyn per primary   -- Remainder of care per ICU team     Attending Note:    I, Marlaine Hind, MD, have personally seen and examined this patient and have participated in their care. I agree with all clinical information, including the physical exam, patient history, and planning as documented by the physician assistant/resident. All notes, labs, and imaging were reviewed.  The patient was seen on the date the note was written      Marlaine Hind, MD  [facility]  [phone]     SUBJECTIVE:   No acute events overnight, no further transfusion requirement.   On 4 of levo, on CRRT  Remains intubated and sedated      Objective:   Current Vitals:   Vitals:    07/01/22  0751   BP: 165/74   Pulse:    Resp:    Temp:    SpO2:        Intake and Output Summary (Last 24 hours):  I/O last 3 completed shifts:  In: 2235.48 [I.V.:1868.81; IV Piggyback:366.67]  Out: 1103 [Other:1083; Blood:20]    Labs:     Results       Procedure Component Value Units Date/Time    Hepatic Function Panel (LFT) [[id number]]  (Abnormal) Collected: 07/01/22 0506    Specimen: Blood, Venous Updated: 07/01/22 0554     Bilirubin, Total 0.4 mg/dL      Bilirubin Direct 0.2 mg/dL      Bilirubin Indirect 0.2 mg/dL      AST (SGOT) 25 U/L      ALT 18 U/L      Alkaline Phosphatase 68 U/L      Albumin 1.4 g/dL      Globulin 2.6 g/dL      Albumin/Globulin Ratio 0.5     Protein, Total 4.0 g/dL     Renal Function Panel [[id number]]  (Abnormal) Collected: 07/01/22 0506    Specimen: Blood, Venous Updated: 07/01/22 0554     Glucose 151 mg/dL      Sodium 865 mEq/L  Potassium 4.8 mEq/L      Chloride 105 mEq/L      CO2 21 mEq/L      BUN 18 mg/dL      Calcium 9.3 mg/dL      Creatinine 2.8 mg/dL      Albumin 1.4 g/dL      Phosphorus 3.4 mg/dL      Anion Gap 16.1     GFR 16.1 mL/min/1.73 m2     Magnesium [[id number]]  (Normal) Collected: 07/01/22 0506    Specimen: Blood, Venous Updated: 07/01/22 0554     Magnesium 2.5 mg/dL     PT/APTT [[id number]]  (Normal) Collected: 07/01/22 0506    Specimen: Blood, Venous Updated: 07/01/22 0540     PT 12.1 sec      INR 1.0     PTT 38 sec     CBC with Differential [[id number]]  (Abnormal) Collected: 07/01/22 0506    Specimen: Blood, Venous Updated: 07/01/22 0535    Narrative:      The following orders were created for panel order CBC with Differential.  Procedure                               Abnormality         Status                     ---------                               -----------         ------                     CBC with Differential[[id number]]        Abnormal            Final result                 Please view results for these tests on the individual orders.    CBC with Differential  [[id number]]  (Abnormal) Collected: 07/01/22 0506    Specimen: Blood, Venous Updated: 07/01/22 0535     WBC 23.99 x10 3/uL      Hemoglobin 7.9 g/dL      Hematocrit 52.8 %      Platelet Count 107 x10 3/uL      MPV 11.3 fL      RBC 2.67 x10 6/uL      MCV 89.5 fL      MCH 29.6 pg      MCHC 33.1 g/dL      RDW 16 %      nRBC % 0.2 /100 WBC      Absolute nRBC 0.05 x10 3/uL      Preliminary Absolute Neutrophil Count 19.81 x10 3/uL      Neutrophils % 82.6 %      Lymphocytes % 7.5 %      Monocytes % 7.3 %      Eosinophils % 0.0 %      Basophils % 0.1 %      Immature Granulocytes % 2.5 %      Absolute Neutrophils 19.81 x10 3/uL      Absolute Lymphocytes 1.79 x10 3/uL      Absolute Monocytes 1.75 x10 3/uL      Absolute Eosinophils 0.01 x10 3/uL      Absolute Basophils 0.03 x10 3/uL  Absolute Immature Granulocytes 0.60 x10 3/uL     Calcium, Ionized [[id number]]  (Abnormal) Collected: 07/01/22 0506    Specimen: Blood, Venous Updated: 07/01/22 0520     Calcium, Ionized 2.87 mEq/L     Triglycerides [[id number]] Collected: 07/01/22 0506    Specimen: Blood, Venous Updated: 07/01/22 0518    Prepare / Crossmatch Red Blood Cells:  One Unit, 1 Units [[id number]] Collected: 06/29/22 0556     Updated: 07/01/22 0052     RBC Leukoreduced RBC LR     Unit Number [id number]     Product Status transfused     Product Code [id number]     Unit Blood Type O POS     Expiration Date [id number]    Renal Function Panel [[id number]]  (Abnormal) Collected: 06/30/22 2310    Specimen: Blood, Venous Updated: 06/30/22 2352     Glucose 158 mg/dL      Sodium 756 mEq/L      Potassium 4.9 mEq/L      Chloride 107 mEq/L      CO2 20 mEq/L      BUN 21 mg/dL      Calcium 9.3 mg/dL      Creatinine 3.6 mg/dL      Albumin 1.6 g/dL      Phosphorus 4.0 mg/dL      Anion Gap 43.3     GFR 11.9 mL/min/1.73 m2     Magnesium [[id number]]  (Normal) Collected: 06/30/22 2310    Specimen: Blood, Venous Updated: 06/30/22 2352     Magnesium 2.5 mg/dL     PT/APTT [[id number]]   (Normal) Collected: 06/30/22 2310    Specimen: Blood, Venous Updated: 06/30/22 2351     PT 12.3 sec      INR 1.1     PTT 27 sec     CBC with Differential [[id number]]  (Abnormal) Collected: 06/30/22 2310    Specimen: Blood, Venous Updated: 06/30/22 2345    Narrative:      The following orders were created for panel order CBC with Differential.  Procedure                               Abnormality         Status                     ---------                               -----------         ------                     CBC with Differential[[id number]]        Abnormal            Final result                 Please view results for these tests on the individual orders.    CBC with Differential [[id number]]  (Abnormal) Collected: 06/30/22 2310    Specimen: Blood, Venous Updated: 06/30/22 2345     WBC 25.27 x10 3/uL      Hemoglobin 8.7 g/dL      Hematocrit 62.3 %      Platelet Count 105 x10 3/uL      MPV 11.1 fL      RBC 2.86 x10 6/uL  MCV 88.5 fL      MCH 30.4 pg      MCHC 34.4 g/dL      RDW 15 %      nRBC % 0.2 /100 WBC      Absolute nRBC 0.04 x10 3/uL      Preliminary Absolute Neutrophil Count 21.52 x10 3/uL      Neutrophils % 85.1 %      Lymphocytes % 6.1 %      Monocytes % 6.8 %      Eosinophils % 0.0 %      Basophils % 0.2 %      Immature Granulocytes % 1.8 %      Absolute Neutrophils 21.52 x10 3/uL      Absolute Lymphocytes 1.53 x10 3/uL      Absolute Monocytes 1.71 x10 3/uL      Absolute Eosinophils 0.01 x10 3/uL      Absolute Basophils 0.05 x10 3/uL      Absolute Immature Granulocytes 0.45 x10 3/uL     Calcium, Ionized [[id number]]  (Abnormal) Collected: 06/30/22 2310    Specimen: Blood, Venous Updated: 06/30/22 2329     Calcium, Ionized 2.73 mEq/L     Renal Function Panel [[id number]]  (Abnormal) Collected: 06/30/22 1646    Specimen: Blood, Venous Updated: 06/30/22 1735     Glucose 222 mg/dL      Sodium 782 mEq/L      Potassium 5.6 mEq/L      Chloride 107 mEq/L      CO2 16 mEq/L      BUN 26 mg/dL      Calcium 9.2  mg/dL      Creatinine 4.9 mg/dL      Albumin 1.8 g/dL      Phosphorus 5.9 mg/dL      Anion Gap 95.6     GFR 8.2 mL/min/1.73 m2     Magnesium [[id number]]  (Normal) Collected: 06/30/22 1646    Specimen: Blood, Venous Updated: 06/30/22 1735     Magnesium 2.4 mg/dL     CBC with Differential [[id number]]  (Abnormal) Collected: 06/30/22 1646    Specimen: Blood, Venous Updated: 06/30/22 1728    Narrative:      The following orders were created for panel order CBC with Differential.  Procedure                               Abnormality         Status                     ---------                               -----------         ------                     CBC with Differential[[id number]]        Abnormal            Final result                 Please view results for these tests on the individual orders.    PT/APTT [[id number]]  (Normal) Collected: 06/30/22 1646    Specimen: Blood, Venous Updated: 06/30/22 1728     PT 12.9 sec      INR 1.1  PTT 27 sec     CBC with Differential [[id number]]  (Abnormal) Collected: 06/30/22 1646    Specimen: Blood, Venous Updated: 06/30/22 1728     WBC 26.44 x10 3/uL      Hemoglobin 8.8 g/dL      Hematocrit 40.9 %      Platelet Count 113 x10 3/uL      MPV 11.1 fL      RBC 2.91 x10 6/uL      MCV 89.7 fL      MCH 30.2 pg      MCHC 33.7 g/dL      RDW 15 %      nRBC % 0.2 /100 WBC      Absolute nRBC 0.04 x10 3/uL      Preliminary Absolute Neutrophil Count 22.98 x10 3/uL      Neutrophils % 86.8 %      Lymphocytes % 4.7 %      Monocytes % 6.2 %      Eosinophils % 0.2 %      Basophils % 0.2 %      Immature Granulocytes % 1.9 %      Absolute Neutrophils 22.98 x10 3/uL      Absolute Lymphocytes 1.23 x10 3/uL      Absolute Monocytes 1.64 x10 3/uL      Absolute Eosinophils 0.04 x10 3/uL      Absolute Basophils 0.05 x10 3/uL      Absolute Immature Granulocytes 0.50 x10 3/uL     Calcium, Ionized [[id number]]  (Normal) Collected: 06/30/22 1646    Specimen: Blood, Venous Updated: 06/30/22 1705     Calcium,  Ionized 2.56 mEq/L     PT/APTT [[id number]]  (Abnormal) Collected: 06/30/22 1226    Specimen: Blood, Venous Updated: 06/30/22 1306     PT 13.1 sec      INR 1.1     PTT 28 sec     Renal Function Panel [[id number]]  (Abnormal) Collected: 06/30/22 1226    Specimen: Blood, Venous Updated: 06/30/22 1305     Glucose 185 mg/dL      Sodium 528 mEq/L      Potassium 5.4 mEq/L      Chloride 108 mEq/L      CO2 17 mEq/L      BUN 23 mg/dL      Calcium 9.2 mg/dL      Creatinine 4.6 mg/dL      Albumin 1.7 g/dL      Phosphorus 6.0 mg/dL      Anion Gap 41.3     GFR 8.8 mL/min/1.73 m2     Magnesium [[id number]]  (Normal) Collected: 06/30/22 1226    Specimen: Blood, Venous Updated: 06/30/22 1305     Magnesium 2.3 mg/dL     CBC with Differential [[id number]]  (Abnormal) Collected: 06/30/22 1226    Specimen: Blood, Venous Updated: 06/30/22 1255    Narrative:      The following orders were created for panel order CBC with Differential.  Procedure                               Abnormality         Status                     ---------                               -----------         ------  CBC with Differential[[id number]]        Abnormal            Final result                 Please view results for these tests on the individual orders.    CBC with Differential [[id number]]  (Abnormal) Collected: 06/30/22 1226    Specimen: Blood, Venous Updated: 06/30/22 1255     WBC 19.39 x10 3/uL      Hemoglobin 8.7 g/dL      Hematocrit 82.9 %      Platelet Count 100 x10 3/uL      MPV 10.9 fL      RBC 2.90 x10 6/uL      MCV 89.0 fL      MCH 30.0 pg      MCHC 33.7 g/dL      RDW 15 %      nRBC % 0.2 /100 WBC      Absolute nRBC 0.03 x10 3/uL      Preliminary Absolute Neutrophil Count 16.90 x10 3/uL      Neutrophils % 87.0 %      Lymphocytes % 4.7 %      Monocytes % 5.9 %      Eosinophils % 0.2 %      Basophils % 0.2 %      Immature Granulocytes % 2.0 %      Absolute Neutrophils 16.90 x10 3/uL      Absolute Lymphocytes 0.91 x10 3/uL       Absolute Monocytes 1.14 x10 3/uL      Absolute Eosinophils 0.03 x10 3/uL      Absolute Basophils 0.03 x10 3/uL      Absolute Immature Granulocytes 0.38 x10 3/uL     Calcium, Ionized [[id number]]  (Abnormal) Collected: 06/30/22 1226    Specimen: Blood, Venous Updated: 06/30/22 1249     Calcium, Ionized 2.64 mEq/L             Rads:     Radiology Results (24 Hour)       Procedure Component Value Units Date/Time    XR Chest AP Portable [[id number]] Collected: 06/30/22 1651    Order Status: Completed Updated: 06/30/22 1655    Narrative:      HISTORY: Status post dialysis catheter placement     COMPARISON: Study performed earlier the same day    FINDINGS:   Endotracheal tube is 3.9 cm from the carina. Enteric tube is in the  stomach. Right internal jugular catheter in the cavoatrial junction. Right  internal jugular dual lumen catheter in the right atrium. No pneumothorax  is seen. Bibasilar atelectasis. Cardiac mediastinal silhouette is stable.  There is pulmonary vascular congestion. Trace left pleural effusion.      Impression:          1. Lines and tubes in adequate position. No pneumothorax.  2. Trace left pleural effusion and bibasilar atelectasis.    Jasmine December D'Heureux, MD  06/30/2022 4:53 PM    XR Chest AP Portable [[id number]] Collected: 06/30/22 1403    Order Status: Completed Updated: 06/30/22 1409    Narrative:      HISTORY: Hemodialysis catheter placement.     COMPARISON: 06/28/2022.    FINDINGS:     The endotracheal tube is now in good position 2.7 cm above the carina.  Enteric tube in the stomach.  The dialysis catheter with its tip into right atrium.   Trace left-sided pleural effusion.  No focal  consolidation. No pneumothorax.  Mild interstitial edema.        Impression:        Catheters as described.    Pankaj Dominica, MD  06/30/2022 2:07 PM              Medications:     Current Facility-Administered Medications   Medication Dose Route Frequency    insulin lispro  1-8 Units Subcutaneous Q4H SCH    micafungin  100  mg Intravenous Q24H    piperacillin-tazobactam  4.5 g Intravenous Q8H      calcium GLUConate CRRT infusion 40 mL/hr (07/01/22 0700)    fentaNYL 75 mcg/hr (07/01/22 0700)    heparin 10,000 units in normal saline 20 mL syringe (CRRT) 500 Units/hr (07/01/22 0716)    norepinephrine 3 mcg/min (07/01/22 0751)    prismaSATE 4/2.5 1,500 mL/hr at 07/01/22 0807    prismaSATE 4/2.5 1,000 mL/hr at 07/01/22 0443    propofol 10 mcg/kg/min (07/01/22 0700)     dextrose **OR** dextrose **OR** dextrose **OR** glucagon (rDNA), fentaNYL (PF), fentaNYL **AND** fentaNYL, magnesium sulfate, potassium chloride, sodium chloride (PF), sodium phosphates 20 mmol in dextrose 5 % 250 mL IVPB     Physical Exam:     Gen: intubated, sedated, on CRRT  HEENT: NC/AT  Pulm: No increased work of breathing. ORA   CV: RRR  Abd: soft, mildly distended. No rebound or guarding. Wound vac in place w/ good seal.   Ext: warm and well-perfused   Neuro: UTA  MSK: moving all extremities       Dion Saucier, MD  General Surgery, PGY-3

## 2022-06-30 NOTE — Brief Op Note (Signed)
BRIEF OP NOTE    Date Time: 06/30/22 11:54 AM    Patient Name:   Shelby Barron, Shelby Barron (MRN: [id number])    Date of Operation:   06/30/2022     Providers Performing:   Surgeons and Role:     Marlaine Hind, MD - Primary    Surgical First Assistant(s):   First Assistant: Lynnda Shields    Operative Procedure:   SMALL BOWEL ANASTOMOSIS, CLOSURE OF MESENTERIC DEFECT, CLOSURE OF ABDOMINAL WALL: 54098 (CPT)  WOUND VAC APPLICATION:     Preoperative Diagnosis:   Pre-Op Diagnosis Codes:     * Delayed postoperative wound closure [Z48.1]    Postoperative Diagnosis:   Post-Op Diagnosis Codes:     * Delayed postoperative wound closure [Z48.1]    Findings:   All small bowel  viable, no other small bowel abnormalities noted, side to side stapled small bowel anastomosis, large mesenteric defect in right lower quadrant- closed in running fashion with 3-0 Vicryl, fascia closed with running 0 PDS without any tension, wound vac applied to midline incision     Anesthesia:   No value filed.    Estimated Blood Loss:    20 mL    Implants:     Implant Name Type Inv. Item Serial No. Model No. Manufacturer Lot No. LRB No. Used Action   BARRIER ADH SOD HALUR CMC SPRFLM 6X5IN - [id number] Sheet BARRIER ADH SOD HALUR CMC SPRFLM 6X5IN  [id number] BAXTER [id number] N/A 1 Implanted   BARRIER ADH SOD HALUR CMC SPRFLM 6X5IN - [id number] Sheet BARRIER ADH SOD HALUR CMC SPRFLM 6X5IN  [id number] BAXTER [id number] N/A 1 Implanted           Drains:   Drains: no            Specimens:     ID Type Source Tests Collected by Time Destination   1 : MESH Implanted Device Implanted Device (Specify if applicable) NO PATHOLOGY Marlaine Hind, MD 06/30/2022 1058           Complications:   None     Signed by: Marlaine Hind, MD                                                                           [facility]

## 2022-06-30 NOTE — Procedures (Signed)
Shelby Barron is a 82 y.o. female patient.  Principal Problem:    Bladder cancer  Active Problems:    Anemia    COPD (chronic obstructive pulmonary disease)    Past Medical History:   Diagnosis Date    Acid reflux     Asthma, well controlled     Cancer of kidney     Chronic lower back pain     Congestive heart disease     COPD (chronic obstructive pulmonary disease)     CVA (cerebral infarction) 01/12/2003    Diabetes     Diabetic nephropathy     Fibroids     Gout     H/O: gout     Hyperlipidemia     Hypertension     Neuropathy of hand     SOB (shortness of breath)      Blood pressure 107/40, pulse 83, temperature 98.9 F (37.2 C), temperature source Axillary, resp. rate 22, height 1.6 m (5' 2.99"), weight 96 kg (211 lb 10.3 oz), SpO2 99%.    Temporary dialysis catheter insertion    Date/Time: 06/30/2022 4:30 PM    Performed by: Al Rae Mar, MD  Authorized by: Al Rae Mar, MD  Consent: Verbal consent obtained. Written consent obtained.  Risks and benefits: risks, benefits and alternatives were discussed  Consent given by: power of attorney  Patient understanding: patient states understanding of the procedure being performed  Patient consent: the patient's understanding of the procedure matches consent given  Procedure consent: procedure consent matches procedure scheduled  Patient identity confirmed: arm band  Time out: Immediately prior to procedure a "time out" was called to verify the correct patient, procedure, equipment, support staff and site/side marked as required.  Indications: dialysis, current permacath is not working well.  Anesthesia: local infiltration    Anesthesia:  Local Anesthetic: lidocaine 1% without epinephrine  Anesthetic total: 3 mL  Preparation: skin prepped with 2% chlorhexidine  Skin prep agent dried: skin prep agent completely dried prior to procedure  Sterile barriers: all five maximum sterile barriers used - cap, mask, sterile gown, sterile gloves, and large sterile  sheet  Hand hygiene: hand hygiene performed prior to central venous catheter insertion  Location details: right internal jugular  Patient position: Trendelenburg  Catheter type: double lumen  Pre-procedure: landmarks identified  Ultrasound guidance: yes  Sterile ultrasound techniques: sterile gel and sterile probe covers were used  Number of attempts: 1  Successful placement: yes  Post-procedure: line sutured and dressing applied  Assessment: blood return through all ports, free fluid flow, placement verified by x-ray and no pneumothorax on x-ray  Patient tolerance: patient tolerated the procedure well with no immediate complications          Sol Odor Sudie Bailey, MD  06/30/2022

## 2022-06-30 NOTE — Progress Notes (Signed)
GENERAL SURGERY PROGRESS NOTE  [id number]    Date Time: 06/30/22 8:25 AM  Patient Name: [facility] Day: 2    ASSESSMENT:     Patient Active Problem List   Diagnosis    Type 2 diabetes mellitus with kidney complication, with long-term current use of insulin    Diabetes mellitus type II, uncontrolled    Neuropathy of hand    Left renal mass    Hamstring injury    Peritonitis    ESRD (end stage renal disease)    Chronic diastolic congestive heart failure    Aortic atherosclerosis    Malignant neoplasm of urinary bladder, unspecified site    Malignant tumor of kidney, left    Benign essential hypertension    GERD (gastroesophageal reflux disease)    Leukocytosis    Bladder cancer    Anemia    COPD (chronic obstructive pulmonary disease)     82 year old female with bladder cancer and renal failure admitted for cystectomy with bilateral nephrectomy complicated by enterotomy requiring small bowel resection in discontinuity and negative pressure temporary abdominal closure given pressure support and need for resuscitation.   PLAN:   - Diet: N.p.o.  - Abx: Zosyn per primary  - On of Levo, MAP at 60-70s on BP cuff  - Hb 6.7 this am  - Patient to be transfused 1 u PRBC  - Return to OR today for exploratory laparotomy, small bowel anastomosis, abdominal closure and any other indicated procedures      SUBJECTIVE:   Patient intubated and sedated    Objective:   Current Vitals:   Vitals:    06/30/22 0800   BP:    Pulse:    Resp:    Temp: 98.6 F (37 C)   SpO2:        Intake and Output Summary (Last 24 hours):  I/O last 3 completed shifts:  In: 2905.64 [I.V.:1705.64; IV Piggyback:1200]  Out: 2168 [Other:2168]    Labs:     Results       Procedure Component Value Units Date/Time    Prepare / Crossmatch Red Blood Cells:  One Unit, 1 Units [[id number]] Collected: 06/29/22 0556     Updated: 06/30/22 0816     RBC Leukoreduced RBC LR     Unit Number [id number]     Product Status issued     Product Code E0336V00      Unit Blood Type O POS     Expiration Date [id number]    CBC with Differential [[id number]]  (Abnormal) Collected: 06/30/22 0544    Specimen: Blood, Arterial Updated: 06/30/22 0654    Narrative:      The following orders were created for panel order CBC with Differential.  Procedure                               Abnormality         Status                     ---------                               -----------         ------                     CBC with Differential[[id number]]  Abnormal            Final result                 Please view results for these tests on the individual orders.    CBC with Differential [[id number]]  (Abnormal) Collected: 06/30/22 0544    Specimen: Blood, Arterial Updated: 06/30/22 0654     WBC 15.47 x10 3/uL      Hemoglobin 6.7 g/dL      Hematocrit 40.9 %      Platelet Count 97 x10 3/uL      MPV 10.4 fL      RBC 2.25 x10 6/uL      MCV 89.3 fL      MCH 29.8 pg      MCHC 33.3 g/dL      RDW 15 %      nRBC % 0.0 /100 WBC      Absolute nRBC 0.00 x10 3/uL      Preliminary Absolute Neutrophil Count 12.88 x10 3/uL      Neutrophils % 83.3 %      Lymphocytes % 7.2 %      Monocytes % 7.7 %      Eosinophils % 0.4 %      Basophils % 0.1 %      Immature Granulocytes % 1.3 %      Absolute Neutrophils 12.88 x10 3/uL      Absolute Lymphocytes 1.12 x10 3/uL      Absolute Monocytes 1.19 x10 3/uL      Absolute Eosinophils 0.06 x10 3/uL      Absolute Basophils 0.02 x10 3/uL      Absolute Immature Granulocytes 0.20 x10 3/uL     Comprehensive Metabolic Panel [[id number]]  (Abnormal) Collected: 06/30/22 0544    Specimen: Blood, Arterial Updated: 06/30/22 0640     Glucose 144 mg/dL      BUN 20 mg/dL      Creatinine 4.1 mg/dL      Sodium 956 mEq/L      Potassium 5.0 mEq/L      Chloride 107 mEq/L      CO2 21 mEq/L      Calcium 9.6 mg/dL      Anion Gap 8.0     GFR 10.2 mL/min/1.73 m2      AST (SGOT) 37 U/L      ALT 19 U/L      Alkaline Phosphatase 47 U/L      Albumin 1.8 g/dL      Protein, Total 3.6 g/dL       Globulin 1.8 g/dL      Albumin/Globulin Ratio 1.0     Bilirubin, Total 0.4 mg/dL     Phosphorus [[id number]]  (Normal) Collected: 06/30/22 0544    Specimen: Blood, Arterial Updated: 06/30/22 0640     Phosphorus 4.7 mg/dL     Magnesium [[id number]]  (Normal) Collected: 06/30/22 0544    Specimen: Blood, Arterial Updated: 06/30/22 0640     Magnesium 2.2 mg/dL     PT/APTT [[id number]]  (Abnormal) Collected: 06/30/22 0544    Specimen: Blood, Arterial Updated: 06/30/22 0606     PT 13.1 sec      INR 1.1     PTT 31 sec     Calcium, Ionized [[id number]]  (Abnormal) Collected: 06/30/22 0544    Specimen: Blood, Arterial Updated: 06/30/22 0604     Calcium, Ionized 2.77 mEq/L     Rectal Swab Carbapenemase Surveillance, PCR [[id number]]  (Normal) Collected: 06/29/22 1842  Specimen: Swab from Rectum Updated: 06/30/22 0150     IMP gene sequence Not Detected     VIM gene sequence Not Detected     NDM gene sequence Not Detected     KPC gene sequence Not Detected     OXA-48 gene sequence Not Detected    Narrative:      This test utilizes real-time PCR for the detection and differentiation of gene sequences associated with carbapenem-non-susceptibility in Enterobacterales (Enterobacteriaceae), Pseudomonas aeruginosa and Acinetobacter baumannii. Targets include blaKPC, blaNDM, blaVIM, blaOXA-48 (and blaOXA-181), and blaIMP (excluding IMP-7, IMP-13, IMP-14). Detection of these gene sequences does not indicate the presence of viable organisms. This assay does not report variants of these gene sequences. Mutations or polymorphisms may affect detection of current, new, or unknown variants, resulting in a false negative result. This test is for infection prevention surveillance purposes only and should not be used to make treatment decisions. The performance of the Xpert Carba-R Assay has not been evaluated with rectal swab specimens from pediatric patients.    Calcium, Ionized [[id number]]  (Abnormal) Collected: 06/29/22 2313    Specimen:  Blood, Venous Updated: 06/30/22 0003     Calcium, Ionized 2.71 mEq/L     PT/APTT [[id number]]  (Normal) Collected: 06/29/22 2313    Specimen: Blood, Venous Updated: 06/29/22 2349     PT 12.5 sec      INR 1.1     PTT 28 sec     CBC with Differential [[id number]]  (Abnormal) Collected: 06/29/22 2313    Specimen: Blood, Venous Updated: 06/29/22 2343    Narrative:      The following orders were created for panel order CBC with Differential.  Procedure                               Abnormality         Status                     ---------                               -----------         ------                     CBC with Differential[[id number]]        Abnormal            Final result                 Please view results for these tests on the individual orders.    CBC with Differential [[id number]]  (Abnormal) Collected: 06/29/22 2313    Specimen: Blood, Venous Updated: 06/29/22 2343     WBC 15.10 x10 3/uL      Hemoglobin 7.8 g/dL      Hematocrit 27.0 %      Platelet Count 122 x10 3/uL      MPV 10.9 fL      RBC 2.54 x10 6/uL      MCV 87.8 fL      MCH 30.7 pg      MCHC 35.0 g/dL      RDW 15 %      nRBC % 0.0 /100 WBC      Absolute nRBC 0.00 x10 3/uL      Preliminary Absolute Neutrophil Count 12.62 x10 3/uL  Neutrophils % 83.7 %      Lymphocytes % 7.0 %      Monocytes % 7.4 %      Eosinophils % 0.3 %      Basophils % 0.3 %      Immature Granulocytes % 1.3 %      Absolute Neutrophils 12.62 x10 3/uL      Absolute Lymphocytes 1.06 x10 3/uL      Absolute Monocytes 1.12 x10 3/uL      Absolute Eosinophils 0.05 x10 3/uL      Absolute Basophils 0.05 x10 3/uL      Absolute Immature Granulocytes 0.20 x10 3/uL     Renal Function Panel [[id number]]  (Abnormal) Collected: 06/29/22 2313    Specimen: Blood, Venous Updated: 06/29/22 2340     Glucose 147 mg/dL      Sodium 409 mEq/L      Potassium 4.4 mEq/L      Chloride 106 mEq/L      CO2 21 mEq/L      BUN 21 mg/dL      Calcium 9.7 mg/dL      Creatinine 4.6 mg/dL      Albumin 2.0 g/dL       Phosphorus 4.8 mg/dL      Anion Gap 9.0     GFR 8.8 mL/min/1.73 m2     Magnesium [[id number]]  (Normal) Collected: 06/29/22 2313    Specimen: Blood, Venous Updated: 06/29/22 2340     Magnesium 2.2 mg/dL     Magnesium [[id number]]  (Normal) Collected: 06/29/22 1841    Specimen: Blood, Venous Updated: 06/29/22 1909     Magnesium 2.2 mg/dL     Renal Function Panel [[id number]]  (Abnormal) Collected: 06/29/22 1841    Specimen: Blood, Venous Updated: 06/29/22 1909     Glucose 155 mg/dL      Sodium 528 mEq/L      Potassium 4.7 mEq/L      Chloride 105 mEq/L      CO2 19 mEq/L      BUN 23 mg/dL      Calcium 9.2 mg/dL      Creatinine 5.2 mg/dL      Albumin 2.3 g/dL      Phosphorus 5.4 mg/dL      Anion Gap 41.3     GFR 7.6 mL/min/1.73 m2     CBC with Differential [[id number]]  (Abnormal) Collected: 06/29/22 1841    Specimen: Blood, Venous Updated: 06/29/22 1906    Narrative:      The following orders were created for panel order CBC with Differential.  Procedure                               Abnormality         Status                     ---------                               -----------         ------                     CBC with Differential[[id number]]        Abnormal            Final result  Please view results for these tests on the individual orders.    CBC with Differential [[id number]]  (Abnormal) Collected: 06/29/22 1841    Specimen: Blood, Venous Updated: 06/29/22 1906     WBC 17.03 x10 3/uL      Hemoglobin 8.3 g/dL      Hematocrit 10.2 %      Platelet Count 140 x10 3/uL      MPV 11.3 fL      RBC 2.75 x10 6/uL      MCV 88.0 fL      MCH 30.2 pg      MCHC 34.3 g/dL      RDW 15 %      nRBC % 0.0 /100 WBC      Absolute nRBC 0.00 x10 3/uL      Preliminary Absolute Neutrophil Count 13.98 x10 3/uL      Neutrophils % 82.1 %      Lymphocytes % 8.3 %      Monocytes % 7.8 %      Eosinophils % 0.4 %      Basophils % 0.2 %      Immature Granulocytes % 1.2 %      Absolute Neutrophils 13.98 x10 3/uL      Absolute  Lymphocytes 1.41 x10 3/uL      Absolute Monocytes 1.33 x10 3/uL      Absolute Eosinophils 0.06 x10 3/uL      Absolute Basophils 0.04 x10 3/uL      Absolute Immature Granulocytes 0.21 x10 3/uL     PT/APTT [[id number]]  (Abnormal) Collected: 06/29/22 1841    Specimen: Blood, Venous Updated: 06/29/22 1903     PT 12.5 sec      INR 1.1     PTT 26 sec     Calcium, Ionized [[id number]]  (Abnormal) Collected: 06/29/22 1841    Specimen: Blood, Venous Updated: 06/29/22 1856     Calcium, Ionized 2.66 mEq/L     Arterial Blood Gas with Chemistries [[id number]]  (Abnormal) Collected: 06/29/22 1136    Specimen: Blood, Arterial Updated: 06/29/22 1245     Arterial pH 7.380     Arterial pCO2 33.7 mmHg      Arterial pO2 132.0 mmHg      Arterial HCO3 19.5 mEq/L      Arterial CO2 18.6 mEq/L      Arterial Base Excess -4.5 mEq/L      Arterial O2 Saturation 98.4 %      Temperature 37.0 C      Hemoglobin Total 8.4 g/dL      Hematocrit Total Calculated --     Whole Blood Sodium 134 mEq/L      Whole Blood Potassium 3.6 mEq/L      Whole Blood Chloride 101 mEq/L      Calcium, Ionized 2.34 mEq/L      Whole Blood Glucose --     Whole Blood Lactic Acid 1.8 mmol/L     Renal Function Panel [[id number]]  (Abnormal) Collected: 06/29/22 1135    Specimen: Blood, Venous Updated: 06/29/22 1201     Glucose 197 mg/dL      Sodium 063 mEq/L      Potassium 3.8 mEq/L      Chloride 102 mEq/L      CO2 18 mEq/L      BUN 28 mg/dL      Calcium 8.5 mg/dL      Creatinine 6.9 mg/dL      Albumin 2.5 g/dL  Phosphorus 6.9 mg/dL      Anion Gap 16.1     GFR 5.4 mL/min/1.73 m2     Magnesium [[id number]]  (Normal) Collected: 06/29/22 1135    Specimen: Blood, Venous Updated: 06/29/22 1201     Magnesium 2.2 mg/dL     PT/INR [[id number]]  (Normal) Collected: 06/29/22 1135    Specimen: Blood, Venous Updated: 06/29/22 1154     PT 12.6 sec      INR 1.1    APTT [[id number]]  (Abnormal) Collected: 06/29/22 1135    Specimen: Blood, Venous Updated: 06/29/22 1154     PTT 26 sec      Arterial Blood Gas [[id number]]  (Abnormal) Collected: 06/29/22 1136    Specimen: Blood, Arterial Updated: 06/29/22 1151     Arterial pH 7.380     Arterial pCO2 33.7 mmHg      Arterial pO2 132.0 mmHg      Arterial HCO3 19.5 mEq/L      Arterial CO2 18.6 mEq/L      Arterial Base Excess -4.5 mEq/L      Arterial O2 Saturation 98.4 %      Temperature 37.0 C     CBC without Differential [[id number]]  (Abnormal) Collected: 06/29/22 1135    Specimen: Blood, Venous Updated: 06/29/22 1147     WBC 18.82 x10 3/uL      Hemoglobin 8.5 g/dL      Hematocrit 01.0 %      Platelet Count 138 x10 3/uL      MPV 11.1 fL      RBC 2.85 x10 6/uL      MCV 89.1 fL      MCH 29.8 pg      MCHC 33.5 g/dL      RDW 15 %      nRBC % 0.1 /100 WBC      Absolute nRBC 0.02 x10 3/uL     Calcium, Ionized [[id number]]  (Normal) Collected: 06/29/22 1135    Specimen: Blood, Venous Updated: 06/29/22 1146     Calcium, Ionized 2.31 mEq/L     Calcium, Ionized [[id number]]  (Normal) Collected: 06/29/22 0904    Specimen: Blood, Venous Updated: 06/29/22 0931     Calcium, Ionized 2.24 mEq/L     PT/APTT [[id number]]  (Abnormal) Collected: 06/29/22 0904    Specimen: Blood, Venous Updated: 06/29/22 0926     PT 13.0 sec      INR 1.1     PTT 26 sec     CBC with Differential [[id number]]  (Abnormal) Collected: 06/29/22 0904    Specimen: Blood, Venous Updated: 06/29/22 6063    Narrative:      The following orders were created for panel order CBC with Differential.  Procedure                               Abnormality         Status                     ---------                               -----------         ------                     CBC with Differential[[id number]]        Abnormal  Final result                 Please view results for these tests on the individual orders.    CBC with Differential [[id number]]  (Abnormal) Collected: 06/29/22 0904    Specimen: Blood, Venous Updated: 06/29/22 0918     WBC 21.92 x10 3/uL      Hemoglobin 7.4 g/dL      Hematocrit 25.3 %       Platelet Count 147 x10 3/uL      MPV 11.2 fL      RBC 2.45 x10 6/uL      MCV 89.8 fL      MCH 30.2 pg      MCHC 33.6 g/dL      RDW 15 %      nRBC % 0.0 /100 WBC      Absolute nRBC 0.00 x10 3/uL      Preliminary Absolute Neutrophil Count 17.11 x10 3/uL      Neutrophils % 78.0 %      Lymphocytes % 10.9 %      Monocytes % 9.2 %      Eosinophils % 0.4 %      Basophils % 0.2 %      Immature Granulocytes % 1.3 %      Absolute Neutrophils 17.11 x10 3/uL      Absolute Lymphocytes 2.39 x10 3/uL      Absolute Monocytes 2.01 x10 3/uL      Absolute Eosinophils 0.08 x10 3/uL      Absolute Basophils 0.04 x10 3/uL      Absolute Immature Granulocytes 0.29 x10 3/uL             Rads:     Radiology Results (24 Hour)       ** No results found for the last 24 hours. **              Medications:     Current Facility-Administered Medications   Medication Dose Route Frequency    insulin lispro  1-8 Units Subcutaneous Q4H SCH    piperacillin-tazobactam  4.5 g Intravenous Q8H      calcium GLUConate CRRT infusion 40 mL/hr (06/30/22 0600)    fentaNYL 50 mcg/hr (06/30/22 0600)    norepinephrine 2 mcg/min (06/30/22 0733)    prismaSATE 4/2.5 1,500 mL/hr at 06/30/22 0445    prismaSATE 4/2.5 1,000 mL/hr at 06/30/22 0445    propofol 10 mcg/kg/min (06/30/22 0600)     sodium chloride, dextrose **OR** dextrose **OR** dextrose **OR** glucagon (rDNA), fentaNYL (PF), fentaNYL **AND** fentaNYL, magnesium sulfate, potassium chloride, sodium chloride (PF), sodium phosphates 20 mmol in dextrose 5 % 250 mL IVPB     Physical Exam:   Physical Exam  Vitals reviewed.   Constitutional:       General: She is not in acute distress.     Appearance: She is ill-appearing.   Eyes:      General: No scleral icterus.  Cardiovascular:      Rate and Rhythm: Normal rate and regular rhythm.      Pulses: Normal pulses.      Comments: 15cg norepinephrine  Pulmonary:      Comments: FiO2:  [29 %-100 %] 30 %  S RR:  [15-22] 22  S VT:  [400 mL] 400 mL  PEEP/EPAP:  [5 cm H20-8 cm H20]  5 cm H20    Abdominal:      General: There is no distension.      Palpations:  Abdomen is soft.      Comments: ABThera in place holding suction with serosanguineous output   Skin:     General: Skin is warm.      Coloration: Skin is not jaundiced.   Neurological:      Comments: Intubated and sedated

## 2022-06-30 NOTE — Progress Notes (Signed)
4 eyes in 4 hours pressure injury assessment note:      Completed with:   Unit & Time admitted:              Bony Prominences: Check appropriate box; if wound is present enter wound assessment in LDA     Occiput:                 [x]WNL  [] Wound present  Face:                     [x]WNL  [] Wound present  Ears:                      [x]WNL  [] Wound present  Spine:                    [x]WNL  [] Wound present  Shoulders:             [x]WNL  [] Wound present  Elbows:                  [x]WNL  [] Wound present  Sacrum/coccyx:     [x]WNL  [] Wound present  Ischial Tuberosity:  [x]WNL  [] Wound present  Trochanter/Hip:      [x]WNL  [] Wound present  Knees:                   [x]WNL  [] Wound present  Ankles:                   [x]WNL  [] Wound present  Heels:                    [x]WNL  [] Wound present  Other pressure areas:  [] Wound location       Device related: [] Device name:         LDA completed if wound present: yes/no  Consult WOCN if necessary    Other skin related issues, ie tears, rash, etc, document in Integumentary flowsheet

## 2022-06-30 NOTE — Plan of Care (Addendum)
N: On Fent + Prop gtt. X4 fent bolus d/t pain located in the abdomen per procedure this AM. Maintained Rass -1 to -2. Intermittently follows simple commands. Gag/cough present. BL wrist restraints in place for prevention of removal of life sustaining devices. Order renewed. Q2 checks performed, documentation completed.  CV: Sinus tachycardia/SR. Pulses weak, BP 60s-70s MAP. Confirmed with provider to utilize cuff pressures.  A-line at R. Radial site. Waveform dampened, Re-zeroing and calibration performed.   X1 U PRBC given prior to OR transfer, bolused per provider; Downtime sheet completed as barcodes were unable to read. Charge Fish farm manager and lab made aware.   RESP: Vent-assisted, PRVC mode, R22, VT 400, P5, 30%. Secretions scant.   GI: Patient sent to OR for abdominal closure, site intact, wound vac compression applied to site. Scant drainage. No BM.   GU: Anuric. Placement of RIJ temp pigtail HD cath done today for initiation of CRRT. CRRT initiated at 1845.   SK: Abd wound site and L. Flank site intact with no active bleeding or drainage.    Problem: Non-Violent Restraints Interdisciplinary Plan  Goal: Will be injury free during the use of non-violent restraints  Outcome: Progressing  Flowsheets (Taken 06/30/2022 0601 by Octaviano Batty, RN)  Will be injury free during the use of non-violent restraints:   Attempt all alternatives before use of restraints   Initiate least restrictive type of restraint that is effective   Provide and maintain safe environment   Notify family of initiation of restraints   Include patient/family/caregiver in decisions related to safety   Ensure safety devices are properly applied and maintained   Document observed patient actions according to protocol   Nurse to accompany patient off unit when on restraints   Remove restraints before the indicated maximum length of time when meets criteria for discontinuation   Document significant changes in patient condition    Provide debriefing as soon as possible and appropriate   Reassess need for continued restraints   Ensure that order for restraints has not expired     Problem: Compromised Activity/Mobility  Goal: Activity/Mobility Interventions  Outcome: Progressing  Flowsheets (Taken 06/30/2022 0800)  Activity/Mobility Interventions: Pad bony prominences, TAP Seated positioning system when OOB, Promote PMP, Reposition q 2 hrs / turn clock, Offload heels     Problem: Inadequate Airway Clearance  Goal: Patent Airway maintained  Outcome: Progressing  Flowsheets (Taken 06/29/2022 1023 by Luz Brazen, RN)  Patent airway maintained:   Position patient for maximum ventilatory efficiency   Reposition patient every 2 hours and as needed unless able to self-reposition   Provide adequate fluid intake to liquefy secretions   Suction secretions as needed  Goal: Normal respiratory rate/effort achieved/maintained  Outcome: Progressing  Flowsheets (Taken 06/29/2022 1023 by Luz Brazen, RN)  Normal respiratory rate/effort achieved/maintained: Plan activities to conserve energy: plan rest periods     Problem: Infection Prevention  Goal: Free from infection  Outcome: Progressing  Flowsheets (Taken 06/29/2022 1023 by Luz Brazen, RN)  Free from infection:   Monitor/assess vital signs   Assess incision for evidence of healing   Monitor/assess lab values and report abnormal values   Encourage/assist patient to turn, cough and perform deep breathing every 2 hours   Monitor/assess output from surgical drain if present   Assess for signs and symptoms of infection     Problem: Constipation  Goal: Fluid and electrolyte balance are achieved/maintained  Outcome: Progressing  Flowsheets (Taken 06/30/2022 0601 by Octaviano Batty, RN)  Fluid and electrolyte balance are achieved/maintained:   Monitor/assess lab values and report abnormal values   Assess and reassess fluid and electrolyte status   Observe for cardiac arrhythmias   Monitor for  muscle weakness

## 2022-06-30 NOTE — Progress Notes (Signed)
[facility] PROGRESS NOTE      Date of Note: 06/30/2022   Patient Name: Shelby Barron     Date of Birth:  July 08, 1940     MRN:               [id number]     PCP:               Patsy Lager, MD     I am available on epic chart and [facility] link extension [phone] Monday - Friday 8:30-16:30. If urology consultation is needed outside these hours please contact [facility] at [phone].      ASSESSMENT/  PLAN     82 yo female with hx of renal failure, renal tumor, bladder cancer.    -medical management per primary, nephrology, general surgery    -no additional recs to add from GU standpoint     I will discuss/discussed the plan of care with the following services:  Dr. Graciela Husbands, Urology   Nursing at bedside   Daughter was out of room taking a phone call, will follow up with her tomorrow     I spent a total of 20 minutes involved in this patient's care    Sheldon Silvan, PA  06/30/2022, 3:23 PM    SUBJECTIVE     Interval History:     6/12: back to OR with GS for closure of abd wall/mesenteric defect and small bowel anastomosis     6/11: in ICU requiring norepinephrine, intubated and sedated      6/10: Radical cystectomy, bilateral  open radical nephrectomies, general surgery consulted intra-op for enterotomy and small bowel resection, patient left in discontinuity as she developed acute blood loss requiring pressors. EBL: 1L.       Patient Active Problem List    Diagnosis Date Noted    Anemia 06/30/2022    COPD (chronic obstructive pulmonary disease) 06/30/2022    Bladder cancer 06/28/2022    Malignant neoplasm of urinary bladder, unspecified site 06/25/2022    Malignant tumor of kidney, left 06/25/2022    GERD (gastroesophageal reflux disease) 06/25/2022    Leukocytosis 06/25/2022    Chronic diastolic congestive heart failure 06/23/2022    Aortic atherosclerosis 06/23/2022    Peritonitis 05/26/2022    ESRD (end stage renal disease) 05/26/2022    Hamstring injury 05/10/2013    Benign essential hypertension 02/10/2013     Left renal mass 11/28/2012    Type 2 diabetes mellitus with kidney complication, with long-term current use of insulin 07/31/2010    Diabetes mellitus type II, uncontrolled 07/31/2010    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 No retractions or increased work of breathing.   Abdomen: non-distended,    LABS & IMAGING  CBC  Recent Labs     06/30/22  1226 06/30/22  0544 06/29/22  2313   WBC 19.39* 15.47* 15.10*   RBC 2.90* 2.25* 2.54*   Hematocrit 25.8* 20.1* 22.3*   MCV 89.0 89.3 87.8   MCH 30.0 29.8 30.7   MCHC 33.7 33.3 35.0   RDW 15 15 15    MPV 10.9 10.4 10.9       BMP  Recent Labs     06/30/22  1226 06/30/22  0544 06/29/22  2313   CO2 17 21 21    Anion Gap 11.0 8.0 9.0   BUN 23* 20 21   Creatinine 4.6* 4.1* 4.6*       INR  Lab Results   Component Value Date/Time    INR 1.1 06/30/2022 12:26 PM    INR 1.1 06/01/2022 03:15 AM         IMAGING:  XR Chest AP Portable   Final Result      Catheters as described.      Pankaj Dominica, MD   06/30/2022 2:07 PM      XR Chest AP Portable   Final Result    Right mainstem bronchial intubation. The tube should be   withdrawn about 4 cm.      Dr. Tildon Husky is aware.      Wynema Birch, MD   06/28/2022 4:56 PM      XR OR MISCOUNT   Final Result    No unexpected foreign body.      These critical results were discussed with and acknowledged by Angie R.,   the circulating nurse, who will communicate the findings to Everardo All,   MD on 06/28/2022 3:02 PM.         Bosie Helper, MD    06/28/2022 3:03 PM             Malnutrition Documentation    Moderate Malnutrition related to inadequate nutritional intake in the setting of acute illness as evidenced by <50% EER > 5 days, mild muscle losses (temporalis, deltoid) - new

## 2022-06-30 NOTE — Progress Notes (Signed)
[facility] ([facility])  Progress Note      Patient Name: Shelby Barron  MRN: [id number]  Room: Z3086/V7846-96    Reason for Admission: Bladder cancer  ICU Day: 2d      Overview   Shelby Barron is a 82 y.o. female who presents with history of PD, CVA, type 2 diabetes presented for elective cystectomy and completion nephrectomy.  OR complicated by small bowel enterotomy and hemorrhagic shock.       Interval Events   24 hour events: Remains sedated on pressors, required 1 unit packed RBC transfusion, CRRT started but issues with the dialysis catheter.  The morning of June 12 she went to the OR for small bowel anastomosis, closure of mesenteric defect, closure of abdominal wall with wound VAC application    Subjective   Hospital Course:  6/10: Admitted - OR for cystectomy. Left abdomen open in discontinuity   6/11:Remains sedated on pressors, required 1 unit packed RBC transfusion, CRRT started but issues with the dialysis catheter     Objective   Physical Examination   Physical Exam    Last vent settings if on vent:  FiO2:  [29 %-30 %] 29 %  S RR:  [22] 22  S VT:  [400 mL] 400 mL  PEEP/EPAP:  [5 cm H20] 5 cm H20    Vitals Temp:  [96.3 F (35.7 C)-99.5 F (37.5 C)]   Heart Rate:  [64-138]   Resp Rate:  [16-29]   BP: (79-165)/(40-123)   Arterial Line BP: (85-154)/(31-45)   SpO2:  [70 %-100 %]   Height:  [160 cm (5' 2.99")]   Weight:  [96 kg (211 lb 10.3 oz)]   BMI (calculated):  [37.5]  // Temperature with 24 range    Input / Output:   Intake/Output Summary (Last 24 hours) at 06/30/2022 1630  Last data filed at 06/30/2022 1600  Gross per 24 hour   Intake 1312.19 ml   Output 1328 ml   Net -15.81 ml       Neuro:    Patient sedated, seems to nod appropriately to questions    Lungs:   Decreased air entry bilaterally, fine crepitation    Cardiac:   Normal heart sound no added sound    Abdomen:    Wound VAC in place, mild tenderness, no active bleed    Extremities:   Trace edema  bilaterally    Skin:     No rash        ICU Checklist  Sedation:   CAM-ICU: Positive or Negative for Delirium: Positive   CAM ICU:   Last Documented RASS: RASS Score: Restless   Currently ordered infusions:    calcium GLUConate CRRT infusion 40 mL/hr (06/30/22 0600)    fentaNYL 100 mcg/hr (06/30/22 1600)    heparin 10,000 units in normal saline 20 mL syringe (CRRT) 500 Units/hr (06/30/22 1323)    norepinephrine 6 mcg/min (06/30/22 1601)    prismaSATE 4/2.5 1,500 mL/hr at 06/30/22 1319    prismaSATE 4/2.5 1,000 mL/hr at 06/30/22 1319    propofol 10 mcg/kg/min (06/30/22 1600)    Reviewed: Yes   Mobility:   Current Mobility Level:   PMP Activity: Step 1 - Bedrest (06/30/2022  4:00 PM)    Current PT Order: No  Current OT Order: No Reviewed: Yes   Respiratory (n/a if blank):   Ventilator Time: 2d   Last Recorded Vent Mode:  FiO2:  [29 %-30 %] 29 %  S RR:  [  22] 22  S VT:  [400 mL] 400 mL  PEEP/EPAP:  [5 cm H20] 5 cm H20 Reviewed: Yes   Gastrointenstinal  Last Bowel Movement:   Last BM Date: 06/03/22 Reviewed: Yes   CAUTI Prevention (n/a if blank):  Foley Day:        Reviewed: Yes   Blood Steam Infection Prevention (n/a if blank):   Permacath Catheter - Tunneled 06/01/22 Internal Jugular RightPeripheral Arterial Line 06/28/22 Right RadialCVC Triple Lumen 06/28/22 Right Femoral Reviewed: Yes   DVT Chemoprophylaxis (none if blank):   heparin 10,000 units in normal saline 20 mL syringe (CRRT)  Reviewed: Yes       Assessment   Shelby Barron is a 82 y.o. female who presents with history of ESRD, CVA, type 2 diabetes presented for elective cystectomy and completion nephrectomy.  OR complicated by small bowel enterotomy and hemorrhagic shock.  .        Patient has BMI=Body mass index is 37.5 kg/m.  Diagnosis: Obesity based on BMI criteria      Recent Labs     06/30/22  1226 06/30/22  0544 06/29/22  2313 06/29/22  1841 06/29/22  1135 06/29/22  0904 06/29/22  0406 06/28/22  2010 06/28/22  1645 06/28/22  1511 06/28/22  1341    Platelet Count 100* 97* 122* 140* 138* 147 136* 144 146 127* 162     Diagnosis: Mild Thrombocytopenia              Recent Labs     06/30/22  1226 06/30/22  0544 06/29/22  2313 06/29/22  1841 06/29/22  1135 06/29/22  0406 06/28/22  2250 06/28/22  2010 06/28/22  1645 06/28/22  1341 06/28/22  0702   Potassium 5.4* 5.0 4.4 4.7 3.8 4.2 4.2 4.6 4.5 3.6 3.4*     Diagnosis: Hyperkalemia        Recent Labs   Lab 06/30/22  1226 06/30/22  0544 06/29/22  2313   Hemoglobin 8.7* 6.7* 7.8*   Hematocrit 25.8* 20.1* 22.3*   MCV 89.0 89.3 87.8   WBC 19.39* 15.47* 15.10*   Platelet Count 100* 97* 122*     Recent Labs   Lab 06/02/22  0514   Iron 62   TIBC 145*   Iron Saturation 43     Anemia Diagnosis: Acute: Acute Blood Loss Anemia   Malnutrition Documentation    Moderate Malnutrition related to inadequate nutritional intake in the setting of acute illness as evidenced by <50% EER > 5 days, mild muscle losses (temporalis, deltoid) - new                    Plan   Plan   NEUROLOGICAL:   #History of CVA  Hold home plavix  Sedated with propofol/fentanyl  Titrating for RASS of -2     CARDIOVASCULAR:   #Mixed shock - hemorrhagic vs septic  Had EBL approximately 1 L in the OR on 6/10 also with small bowel enterotomy  Transfused 3 units PRBC in OR. Received another unit on 6/11 and 6/12  Levophed gtt. for BP support titrating for MAP greater than 65  CBC, coags every 6 hours  Will resuscitate as indicated  Had TTE as part of preop cardiac clearance  Mild aortic stenosis (AVA 1.7), grade 1 diastolic dysfunction, normal LV/RV systolic function    Some of persistent levophed requirement is owing to calcified radial artery (where art line is) - will titrate to cuff pressures  PULMONARY:   #Acute hypoxemic respiratory failure  #COPD  Adjusted ventilator targeting lung protective ventilation strategy (Tidal volume 4-8 cc/kg, limiting plateau pressure < 30 ccH2O)  SBT in am     RENAL:   #ESRD  On HD Tuesday Thursday Saturday  Last dialysis was  6/8  Nephrology consulted  Resume CRRT, new [place] catheter inserted as the permacath malfunctioned     GASTROINTESTINAL:  #S/P Cystectomy and completion nephrectomy  6/10 underwent cystectomy and nephrectomy complicated by small bowel enterotomy and hemorrhagic shock  Went to the OR on June 12 for small bowel anastomosis, closure of mesenteric defect, closure of abdominal wall with wound VAC application       INFECTIOUS DISEASE:   Zosyn/micafungin for intra-abdominal spillage  ID consulted and appreciate their input     HEMATOLOGY/ONCOLOGY:   #Renal cell carcinoma  #Urothelial carcinoma  - VTE ppx: SCDs alone given bleeding  Will restart heparin prophylactically when appropriate     ENDOCRONOLOGY/RHEUMATOLOGY:   #Diabetes Mellitus type 2  ISS, goal less than 180    Discussed with daughter at bedside, discussed with general surgery.  Discussed with nephrology.    This patient is critically ill with life-threatening condition(s) and a high probability of sudden clinically significant deterioration due to the condition(s) noted in the assessment and plan, which requires the highest level of physician/advance practice provider preparedness to intervene urgently. Full attention to the direct care of this patient was provided for the period of time noted below. Any critical care time performed today is exclusive of teaching and billable procedures and not overlapping with any other physicians or advance practice providers.    I have personally assessed the patient and based my assessment and medical decision-making on a review of the patient's history and 24-hour interval events along with medical records, physical examination, vital signs, analysis of recent laboratory results, evaluation of radiology images, monitoring data for potential decompensation, and additional findings found in detail within ICU team notes. The findings and plan of care was discussed with the care team.    Total critical care time: 55 minutes  during this encounter.    Shaniquia Brafford Sudie Bailey, MD   06/30/2022 4:54 PM

## 2022-06-30 NOTE — Progress Notes (Incomplete)
Brief Note:    Discussed in rounds earlier today. S/p exploratory laparotomy, small bowel anastomosis, abdominal closure today. Spoke with MD. Most likely going to be NPO x 3 or more days. Has central line for TPN. Past ordering time will place clinimix 1 L today and advance as tolerated tomorrow. Potassium and phos elevated.

## 2022-06-30 NOTE — Nursing Progress Note (Signed)
4 eyes in 4 hours pressure injury assessment note:      Completed with:   Unit & Time admitted:              Bony Prominences: Check appropriate box; if wound is present enter wound assessment in LDA     Occiput:                 [x]WNL  [] Wound present  Face:                     [x]WNL  [] Wound present  Ears:                      [x]WNL  [] Wound present  Spine:                    [x]WNL  [] Wound present  Shoulders:             [x]WNL  [] Wound present  Elbows:                  [x]WNL  [] Wound present  Sacrum/coccyx:     [x]WNL  [] Wound present  Ischial Tuberosity:  [x]WNL  [] Wound present  Trochanter/Hip:      [x]WNL  [] Wound present  Knees:                   [x]WNL  [] Wound present  Ankles:                   [x]WNL  [] Wound present  Heels:                    [x]WNL  [] Wound present  Other pressure areas: [x] Wound location; Abdomen       Device related: [] Device name:         LDA completed if wound present: yes/no  Consult WOCN if necessary    Other skin related issues, ie tears, rash, etc, document in Integumentary flowsheet

## 2022-06-30 NOTE — Plan of Care (Addendum)
Responds to voice, follows commands  RASS -1 to -2. Sedated w/ propofol ggt  CPOT 0 to 3. Fent ggt, fent bolus x1  B/L wrist restraints to prevent pulling on ETT  NSR. Normotensive.  Vent w/ ETT. Small oral/inline secretions  No BM. NPO  Q4 accuechecks  Anuric.  CRRT w/ three instances of filter clotting. Per nephro, stop and will reassess in the AM.  Ca titrated per order. K replaced  Open abdomen w/ wound vac. 350cc serosanginous drainage    Problem: Moderate/High Fall Risk Score >5  Goal: Patient will remain free of falls  Outcome: Progressing     Problem: Non-Violent Restraints Interdisciplinary Plan  Goal: Will be injury free during the use of non-violent restraints  Outcome: Progressing  Flowsheets (Taken 06/30/2022 0601)  Will be injury free during the use of non-violent restraints:   Attempt all alternatives before use of restraints   Initiate least restrictive type of restraint that is effective   Provide and maintain safe environment   Notify family of initiation of restraints   Include patient/family/caregiver in decisions related to safety   Ensure safety devices are properly applied and maintained   Document observed patient actions according to protocol   Nurse to accompany patient off unit when on restraints   Remove restraints before the indicated maximum length of time when meets criteria for discontinuation   Document significant changes in patient condition   Provide debriefing as soon as possible and appropriate   Reassess need for continued restraints   Ensure that order for restraints has not expired     Problem: Constipation  Goal: Fluid and electrolyte balance are achieved/maintained  Outcome: Progressing  Flowsheets (Taken 06/30/2022 0601)  Fluid and electrolyte balance are achieved/maintained:   Monitor/assess lab values and report abnormal values   Assess and reassess fluid and electrolyte status   Observe for cardiac arrhythmias   Monitor for muscle weakness     Problem: Hemodynamic Status:  Cardiac  Goal: Stable vital signs and fluid balance  Outcome: Progressing  Flowsheets (Taken 06/30/2022 0601)  Stable vital signs and fluid balance:   Assess signs and symptoms associated with cardiac rhythm changes   Monitor lab values     Problem: Renal Instability  Goal: Fluid and electrolyte balance are achieved/maintained  Outcome: Progressing  Flowsheets (Taken 06/30/2022 0601)  Fluid and electrolyte balance are achieved/maintained:   Monitor/assess lab values and report abnormal values   Assess and reassess fluid and electrolyte status   Observe for cardiac arrhythmias   Monitor for muscle weakness   Follow fluid restrictions/IV/PO parameters     Problem: Patient Receiving Advanced Renal Therapies  Goal: Therapy access site remains intact  Outcome: Progressing  Flowsheets (Taken 06/30/2022 0601)  Therapy access site remains intact:   Assess therapy access site   Change therapy access site dressing as needed     Problem: Fluid and Electrolyte Imbalance/ Endocrine  Goal: Fluid and electrolyte balance are achieved/maintained  Outcome: Progressing  Flowsheets (Taken 06/30/2022 0601)  Fluid and electrolyte balance are achieved/maintained:   Monitor/assess lab values and report abnormal values   Assess and reassess fluid and electrolyte status   Observe for cardiac arrhythmias   Monitor for muscle weakness     Problem: Diabetes: Glucose Imbalance  Goal: Blood glucose stable at established goal  Outcome: Progressing  Flowsheets (Taken 06/30/2022 0601)  Blood glucose stable at established goal:   Monitor lab values   Monitor intake and output.  Notify LIP if urine output  is < 30 mL/hour.   Follow fluid restrictions/IV/PO parameters   Include patient/family in decisions related to nutrition/dietary selections   Assess for hypoglycemia /hyperglycemia   Monitor/assess vital signs   Coordinate medication administration with meals, as indicated   Ensure appropriate diet and assess tolerance   Ensure adequate hydration   Ensure  appropriate consults are obtained (Nutrition, Diabetes Education, and Case Management)   Ensure patient/family has adequate teaching materials     Problem: Inadequate Gas Exchange  Goal: Adequate oxygenation and improved ventilation  Outcome: Progressing  Flowsheets (Taken 06/30/2022 0601)  Adequate oxygenation and improved ventilation:   Assess lung sounds   Monitor SpO2 and treat as needed   Monitor and treat ETCO2   Provide mechanical and oxygen support to facilitate gas exchange   Position for maximum ventilatory efficiency     Problem: Artificial Airway  Goal: Endotracheal tube will be maintained  Outcome: Progressing  Flowsheets (Taken 06/30/2022 0601)  Endotracheal  tube will be maintained:   Suction secretions as needed   Keep head of bed at 30 degrees, unless contraindicated   Encourage/perform oral hygiene as appropriate   Perform deep oropharyngeal suctioning at least every 4 hours   Apply water-based moisturizer to lips   Utilize ETT securing device   Maintain and assess integrity of ETT securing device   Support ventilator tubing to avoid pressure from drag of tubing

## 2022-07-01 ENCOUNTER — Encounter: Payer: Self-pay | Admitting: Surgery

## 2022-07-01 LAB — LAB USE ONLY - CBC WITH DIFFERENTIAL
Absolute Basophils: 0.03 10*3/uL (ref 0.00–0.08)
Absolute Basophils: 0.03 10*3/uL (ref 0.00–0.08)
Absolute Eosinophils: 0.01 10*3/uL (ref 0.00–0.44)
Absolute Eosinophils: 0.01 10*3/uL (ref 0.00–0.44)
Absolute Immature Granulocytes: 0.6 10*3/uL — ABNORMAL HIGH (ref 0.00–0.07)
Absolute Immature Granulocytes: 0.42 10*3/uL — ABNORMAL HIGH (ref 0.00–0.07)
Absolute Lymphocytes: 1.79 10*3/uL (ref 0.42–3.22)
Absolute Lymphocytes: 1.26 10*3/uL (ref 0.42–3.22)
Absolute Monocytes: 1.75 10*3/uL — ABNORMAL HIGH (ref 0.21–0.85)
Absolute Monocytes: 1.19 10*3/uL — ABNORMAL HIGH (ref 0.21–0.85)
Absolute Neutrophils: 19.81 10*3/uL — ABNORMAL HIGH (ref 1.10–6.33)
Absolute Neutrophils: 15.83 10*3/uL — ABNORMAL HIGH (ref 1.10–6.33)
Absolute nRBC: 0.05 10*3/uL — ABNORMAL HIGH (ref <=0.00)
Absolute nRBC: 0.03 10*3/uL — ABNORMAL HIGH (ref <=0.00)
Basophils %: 0.1 %
Basophils %: 0.2 %
Eosinophils %: 0 %
Eosinophils %: 0.1 %
Hematocrit: 23.9 % — ABNORMAL LOW (ref 34.7–43.7)
Hematocrit: 24.1 % — ABNORMAL LOW (ref 34.7–43.7)
Hemoglobin: 7.9 g/dL — ABNORMAL LOW (ref 11.4–14.8)
Hemoglobin: 8.2 g/dL — ABNORMAL LOW (ref 11.4–14.8)
Immature Granulocytes %: 2.5 %
Immature Granulocytes %: 2.2 %
Lymphocytes %: 7.5 %
Lymphocytes %: 6.7 %
MCH: 29.6 pg (ref 25.1–33.5)
MCH: 30.3 pg (ref 25.1–33.5)
MCHC: 33.1 g/dL (ref 31.5–35.8)
MCHC: 34 g/dL (ref 31.5–35.8)
MCV: 89.5 fL (ref 78.0–96.0)
MCV: 88.9 fL (ref 78.0–96.0)
MPV: 11.3 fL (ref 8.9–12.5)
MPV: 11.1 fL (ref 8.9–12.5)
Monocytes %: 7.3 %
Monocytes %: 6.4 %
Neutrophils %: 82.6 %
Neutrophils %: 84.4 %
Platelet Count: 107 10*3/uL — ABNORMAL LOW (ref 142–346)
Platelet Count: 99 10*3/uL — ABNORMAL LOW (ref 142–346)
Preliminary Absolute Neutrophil Count: 19.81 10*3/uL — ABNORMAL HIGH (ref 1.10–6.33)
Preliminary Absolute Neutrophil Count: 15.83 10*3/uL — ABNORMAL HIGH (ref 1.10–6.33)
RBC: 2.67 10*6/uL — ABNORMAL LOW (ref 3.90–5.10)
RBC: 2.71 10*6/uL — ABNORMAL LOW (ref 3.90–5.10)
RDW: 16 % — ABNORMAL HIGH (ref 11–15)
RDW: 15 % (ref 11–15)
WBC: 23.99 10*3/uL — ABNORMAL HIGH (ref 3.10–9.50)
WBC: 18.74 10*3/uL — ABNORMAL HIGH (ref 3.10–9.50)
nRBC %: 0.2 /100 WBC — ABNORMAL HIGH (ref <=0.0)
nRBC %: 0.2 /100 WBC — ABNORMAL HIGH (ref <=0.0)

## 2022-07-01 LAB — HEPATIC FUNCTION PANEL (LFT)
ALT: 18 U/L (ref 0–55)
AST (SGOT): 25 U/L (ref 5–41)
Albumin/Globulin Ratio: 0.5 — ABNORMAL LOW (ref 0.9–2.2)
Albumin: 1.4 g/dL — ABNORMAL LOW (ref 3.5–5.0)
Alkaline Phosphatase: 68 U/L (ref 37–117)
Bilirubin Direct: 0.2 mg/dL (ref 0.0–0.5)
Bilirubin Indirect: 0.2 mg/dL (ref 0.2–1.0)
Bilirubin, Total: 0.4 mg/dL (ref 0.2–1.2)
Globulin: 2.6 g/dL (ref 2.0–3.6)
Protein, Total: 4 g/dL — ABNORMAL LOW (ref 6.0–8.3)

## 2022-07-01 LAB — WHOLE BLOOD GLUCOSE POCT
Whole Blood Glucose POCT: 135 mg/dL — ABNORMAL HIGH (ref 70–100)
Whole Blood Glucose POCT: 151 mg/dL — ABNORMAL HIGH (ref 70–100)
Whole Blood Glucose POCT: 142 mg/dL — ABNORMAL HIGH (ref 70–100)
Whole Blood Glucose POCT: 128 mg/dL — ABNORMAL HIGH (ref 70–100)
Whole Blood Glucose POCT: 162 mg/dL — ABNORMAL HIGH (ref 70–100)

## 2022-07-01 LAB — RENAL FUNCTION PANEL
Albumin: 1.4 g/dL — ABNORMAL LOW (ref 3.5–5.0)
Albumin: 1.6 g/dL — ABNORMAL LOW (ref 3.5–5.0)
Albumin: 1.5 g/dL — ABNORMAL LOW (ref 3.5–5.0)
Albumin: 1.4 g/dL — ABNORMAL LOW (ref 3.5–5.0)
Anion Gap: 10 (ref 5.0–15.0)
Anion Gap: 10 (ref 5.0–15.0)
Anion Gap: 6 (ref 5.0–15.0)
Anion Gap: 7 (ref 5.0–15.0)
BUN: 18 mg/dL (ref 7–21)
BUN: 16 mg/dL (ref 7–21)
BUN: 14 mg/dL (ref 7–21)
BUN: 15 mg/dL (ref 7–21)
CO2: 21 mEq/L (ref 17–29)
CO2: 22 mEq/L (ref 17–29)
CO2: 24 mEq/L (ref 17–29)
CO2: 22 mEq/L (ref 17–29)
Calcium: 9.3 mg/dL (ref 7.9–10.2)
Calcium: 10 mg/dL (ref 7.9–10.2)
Calcium: 9.5 mg/dL (ref 7.9–10.2)
Calcium: 8.8 mg/dL (ref 7.9–10.2)
Chloride: 105 mEq/L (ref 99–111)
Chloride: 106 mEq/L (ref 99–111)
Chloride: 107 mEq/L (ref 99–111)
Chloride: 107 mEq/L (ref 99–111)
Creatinine: 2.8 mg/dL — ABNORMAL HIGH (ref 0.4–1.0)
Creatinine: 2.2 mg/dL — ABNORMAL HIGH (ref 0.4–1.0)
Creatinine: 1.8 mg/dL — ABNORMAL HIGH (ref 0.4–1.0)
Creatinine: 1.6 mg/dL — ABNORMAL HIGH (ref 0.4–1.0)
GFR: 16.1 mL/min/{1.73_m2} — ABNORMAL LOW (ref >=60.0)
GFR: 21.4 mL/min/{1.73_m2} — ABNORMAL LOW (ref >=60.0)
GFR: 27.3 mL/min/{1.73_m2} — ABNORMAL LOW (ref >=60.0)
GFR: 31.4 mL/min/{1.73_m2} — ABNORMAL LOW (ref >=60.0)
Glucose: 151 mg/dL — ABNORMAL HIGH (ref 70–100)
Glucose: 135 mg/dL — ABNORMAL HIGH (ref 70–100)
Glucose: 132 mg/dL — ABNORMAL HIGH (ref 70–100)
Glucose: 176 mg/dL — ABNORMAL HIGH (ref 70–100)
Phosphorus: 3.4 mg/dL (ref 2.3–4.7)
Phosphorus: 3 mg/dL (ref 2.3–4.7)
Phosphorus: 2.6 mg/dL (ref 2.3–4.7)
Phosphorus: 2.1 mg/dL — ABNORMAL LOW (ref 2.3–4.7)
Potassium: 4.8 mEq/L (ref 3.5–5.3)
Potassium: 4.8 mEq/L (ref 3.5–5.3)
Potassium: 4.9 mEq/L (ref 3.5–5.3)
Potassium: 4.6 mEq/L (ref 3.5–5.3)
Sodium: 136 mEq/L (ref 135–145)
Sodium: 138 mEq/L (ref 135–145)
Sodium: 137 mEq/L (ref 135–145)
Sodium: 136 mEq/L (ref 135–145)

## 2022-07-01 LAB — PT AND APTT
INR: 1 (ref 0.9–1.1)
INR: 1.1 (ref 0.9–1.1)
PT: 12.1 s (ref 10.1–12.9)
PT: 12.6 s (ref 10.1–12.9)
PTT: 38 s (ref 27–39)
PTT: 35 s (ref 27–39)

## 2022-07-01 LAB — CALCIUM, IONIZED
Calcium, Ionized: 2.87 mEq/L — ABNORMAL HIGH (ref 2.23–2.56)
Calcium, Ionized: 2.92 mEq/L — ABNORMAL HIGH (ref 2.23–2.56)
Calcium, Ionized: 2.88 mEq/L — ABNORMAL HIGH (ref 2.23–2.56)
Calcium, Ionized: 2.7 mEq/L — ABNORMAL HIGH (ref 2.23–2.56)

## 2022-07-01 LAB — TRIGLYCERIDES: Triglycerides: 163 mg/dL — ABNORMAL HIGH (ref 34–149)

## 2022-07-01 LAB — MAGNESIUM
Magnesium: 2.5 mg/dL (ref 1.6–2.6)
Magnesium: 2.5 mg/dL (ref 1.6–2.6)
Magnesium: 2.5 mg/dL (ref 1.6–2.6)

## 2022-07-01 MED ORDER — TRAVASOL 10 % IV SOLN
Status: AC
Start: 2022-07-01 — End: 2022-07-02
  Filled 2022-07-01: qty 500

## 2022-07-01 MED ORDER — DEXMEDETOMIDINE HCL IN NACL 400 MCG/100ML IV SOLN
0.0000 ug/kg/h | INTRAVENOUS | Status: DC
Start: 2022-07-01 — End: 2022-07-04
  Administered 2022-07-01: 0.1 ug/kg/h via INTRAVENOUS
  Administered 2022-07-02 (×2): 0.5 ug/kg/h via INTRAVENOUS
  Administered 2022-07-03: 0.3 ug/kg/h via INTRAVENOUS
  Filled 2022-07-01 (×4): qty 100

## 2022-07-01 MED ORDER — STERILE WATER FOR INJECTION IJ/IV SOLN (WRAP)
2.0000 g | Freq: Two times a day (BID) | Status: DC
Start: 2022-07-01 — End: 2022-07-03
  Administered 2022-07-01 – 2022-07-02 (×3): 2 g via INTRAVENOUS
  Filled 2022-07-01 (×3): qty 2

## 2022-07-01 MED ORDER — BENZOCAINE 20% MT SOLN (WRAP)
1.0000 | Freq: Three times a day (TID) | OROMUCOSAL | Status: DC | PRN
Start: 2022-07-01 — End: 2022-08-18
  Administered 2022-07-01 – 2022-07-04 (×2): 1 via OROMUCOSAL
  Filled 2022-07-01 (×2): qty 57

## 2022-07-01 MED ORDER — SMOFLIPID (SOY-MCT-OLIVE-FISH OIL) 20% IV INFUSION (ADULT)
20.0000 g | INTRAVENOUS | Status: AC
Start: 2022-07-01 — End: 2022-07-02
  Administered 2022-07-01: 20 g via INTRAVENOUS
  Filled 2022-07-01: qty 100

## 2022-07-01 MED ORDER — SMOFLIPID (SOY-MCT-OLIVE-FISH OIL) 20% IV INFUSION (ADULT)
20.0000 g | INTRAVENOUS | Status: DC
Start: 2022-07-01 — End: 2022-07-01

## 2022-07-01 MED ORDER — HYDROMORPHONE HCL 1 MG/ML IJ SOLN
0.5000 mg | INTRAMUSCULAR | Status: DC | PRN
Start: 2022-07-01 — End: 2022-07-04
  Administered 2022-07-01 – 2022-07-04 (×9): 0.5 mg via INTRAVENOUS
  Filled 2022-07-01 (×8): qty 1

## 2022-07-01 MED ORDER — HEPARIN SODIUM (PORCINE) 5000 UNIT/ML IJ SOLN
5000.0000 [IU] | Freq: Two times a day (BID) | INTRAMUSCULAR | Status: DC
Start: 2022-07-01 — End: 2022-07-08
  Administered 2022-07-01 – 2022-07-08 (×14): 5000 [IU] via SUBCUTANEOUS
  Filled 2022-07-01 (×14): qty 1

## 2022-07-01 MED ORDER — METRONIDAZOLE IN NACL 500 MG/100 ML IV SOLN (WRAP)
500.0000 mg | Freq: Three times a day (TID) | INTRAVENOUS | Status: DC
Start: 2022-07-01 — End: 2022-07-05
  Administered 2022-07-01 – 2022-07-05 (×13): 500 mg via INTRAVENOUS
  Filled 2022-07-01 (×13): qty 100

## 2022-07-01 MED ORDER — TRAVASOL 10 % IV SOLN
Status: DC
Start: 2022-07-01 — End: 2022-07-01

## 2022-07-01 NOTE — Progress Notes (Signed)
[facility] ([facility])  Progress Note      Patient Name: Shelby Barron  MRN: [id number]  Room: G4010/U7253-66    Reason for Admission: Bladder cancer  ICU Day: 2d 20h      Overview   Shelby Barron is a 82 y.o. female who presents with history of PD, CVA, type 2 diabetes presented for elective cystectomy and completion nephrectomy.  OR complicated by small bowel enterotomy and hemorrhagic shock.    Interval Events   24 hour events: New Quinton inserted to continuous with CRRT.  Weaning down pressors.  No evidence of bleed    Subjective   Hospital Course:  6/10: Admitted - OR for cystectomy. Left abdomen open in discontinuity   6/11:Remains sedated on pressors, required 1 unit packed RBC transfusion, CRRT started but issues with the dialysis catheter  6/12: Remains sedated on pressors, required 1 unit packed RBC transfusion, CRRT started but issues with the dialysis catheter.  The morning of June 12 she went to the OR for small bowel anastomosis, closure of mesenteric defect, closure of abdominal wall with wound VAC application.        Objective   Physical Examination   Physical Exam    Last vent settings if on vent:  FiO2:  [29 %-100 %] 100 %  S RR:  [22] 22  S VT:  [400 mL] 400 mL  PEEP/EPAP:  [5 cm H20] 5 cm H20    Vitals Temp:  [76.1 F (24.5 C)-98.9 F (37.2 C)]   Heart Rate:  [47-121]   Resp Rate:  [17-28]   BP: (82-171)/(40-94)   Arterial Line BP: (74-183)/(37-70)   SpO2:  [89 %-100 %]   Height:  [160 cm (5' 2.99")]   Weight:  [96.7 kg (213 lb 3 oz)]   BMI (calculated):  [37.8]  // Temperature with 24 range    Input / Output:   Intake/Output Summary (Last 24 hours) at 07/01/2022 1232  Last data filed at 07/01/2022 1200  Gross per 24 hour   Intake 1758.89 ml   Output 595 ml   Net 1163.89 ml       Neuro:    Postextubation patient is alert, maintain eye contact, does not follow command most of the time, grunt seems to be trying to talk but and comprehensible, moving 4 limbs  with equal power and generalized weakness    Lungs:   Good air entry bilaterally no wheezing    Cardiac:   Normal heart sound no added sound    Abdomen:    Slightly distended, wound VAC in place, no rebound tenderness    Extremities:   Trace edema    Skin:     No Barron        ICU Checklist  Sedation:   CAM-ICU: Positive or Negative for Delirium: Positive   CAM ICU:   Last Documented RASS: RASS Score: Alert and Calm   Currently ordered infusions:    Adult Central Parenteral Nutrition (TPN) 2-in-1      And    SMOFLIPID (soy-MCT-olive-fish oil)      calcium GLUConate CRRT infusion 30 mL/hr (07/01/22 1200)    heparin 10,000 units in normal saline 20 mL syringe (CRRT) 500 Units/hr (07/01/22 0716)    prismaSATE 4/2.5 1,500 mL/hr at 07/01/22 1129    prismaSATE 4/2.5 1,000 mL/hr at 07/01/22 0808    Reviewed: Yes   Mobility:   Current Mobility Level:   PMP Activity: Step 1 - Bedrest (07/01/2022  12:00 PM)    Current PT Order: No  Current OT Order: No Reviewed: Yes   Respiratory (n/a if blank):   Ventilator Time:    Last Recorded Vent Mode:  FiO2:  [29 %-100 %] 100 %  S RR:  [22] 22  S VT:  [400 mL] 400 mL  PEEP/EPAP:  [5 cm H20] 5 cm H20 Reviewed: Yes   Gastrointenstinal  Last Bowel Movement:   Last BM Date: 06/03/22 Reviewed: Yes   CAUTI Prevention (n/a if blank):  Foley Day:        Reviewed: Yes   Blood Steam Infection Prevention (n/a if blank):   Permacath Catheter - Tunneled 06/01/22 Internal Jugular RightPeripheral Arterial Line 06/28/22 Right RadialCVC Triple Lumen 06/28/22 Right FemoralTemporary Catheter with Pigtail 06/30/22 Temporary Catheter Right Reviewed: Yes   DVT Chemoprophylaxis (none if blank):   heparin 10,000 units in normal saline 20 mL syringe (CRRT)   heparin (porcine) injection 5,000 Units  Reviewed: Yes       Assessment   Shelby Barron is a 82 y.o. female who presents with history of ESRD, CVA, type 2 diabetes presented for elective cystectomy and completion nephrectomy.  OR complicated by small bowel  enterotomy and hemorrhagic shock.       Patient has BMI=Body mass index is 37.77 kg/m.  Diagnosis: No additional diagnosis based on BMI criteria      Recent Labs     07/01/22  1120 07/01/22  0506 06/30/22  2310 06/30/22  1646 06/30/22  1226 06/30/22  0544 06/29/22  2313 06/29/22  1841 06/29/22  1135 06/29/22  0904 06/29/22  0406 06/28/22  2010 06/28/22  1645 06/28/22  1511 06/28/22  1341   Platelet Count 99* 107* 105* 113* 100* 97* 122* 140* 138* 147 136* 144 146 127* 162     Diagnosis: Mild Thrombocytopenia              Recent Labs     07/01/22  1120 07/01/22  0506 06/30/22  2310 06/30/22  1646 06/30/22  1226 06/30/22  0544 06/29/22  2313 06/29/22  1841 06/29/22  1135 06/29/22  0406 06/28/22  2250 06/28/22  2010 06/28/22  1645 06/28/22  1341   Potassium 4.8 4.8 4.9 5.6* 5.4* 5.0 4.4 4.7 3.8 4.2 4.2 4.6 4.5 3.6     Diagnosis: No additional diagnosis         Recent Labs   Lab 07/01/22  1120 07/01/22  0506 06/30/22  2310   Hemoglobin 8.2* 7.9* 8.7*   Hematocrit 24.1* 23.9* 25.3*   MCV 88.9 89.5 88.5   WBC 18.74* 23.99* 25.27*   Platelet Count 99* 107* 105*     Recent Labs   Lab 06/02/22  0514   Iron 62   TIBC 145*   Iron Saturation 43     Anemia Diagnosis: No new diagnosis   Malnutrition Documentation    Moderate Malnutrition related to inadequate nutritional intake in the setting of acute illness as evidenced by <50% EER > 5 days, mild muscle losses (temporalis, deltoid) - new                    Plan   Plan   NEUROLOGICAL:   #History of CVA  #Encephalopathy/delirium  Hold home plavix  Delirium preventing strategy  Avoid benzodiazepine  Pain control, currently using Dilaudid as needed     CARDIOVASCULAR:   #Mixed shock - hemorrhagic vs septic: Resolved  Had EBL approximately 1 L in the OR  on 6/10 also with small bowel enterotomy  Transfused 3 units PRBC in OR. Received another unit on 6/11 and 6/12  MAP greater than 65  Had TTE as part of preop cardiac clearance  Mild aortic stenosis (AVA 1.7), grade 1 diastolic  dysfunction, normal LV/RV systolic function        PULMONARY:   #Acute hypoxemic respiratory failure  #COPD  Did well on SBT  Extubated the morning team, protecting airway, no stridor, good oxygenation ventilation     RENAL:   #ESRD  On HD Tuesday Thursday Saturday  CRRT, appreciate nephrology  Frequent lab     GASTROINTESTINAL:  #S/P Cystectomy and completion nephrectomy  6/10 underwent cystectomy and nephrectomy complicated by small bowel enterotomy and hemorrhagic shock  Went to the OR on June 12 for small bowel anastomosis, closure of mesenteric defect, closure of abdominal wall with wound VAC application  -Discussed with general surgery, given her malnourishment we will go ahead and start TPN.  Will dedicate pigtail use of the Quinton catheter for TPN,        INFECTIOUS DISEASE:   Appreciate ID, discontinuing micafungin  Changing Zosyn to Cefepime/Flagyl given drop in platelet     HEMATOLOGY/ONCOLOGY:   #Renal cell carcinoma  #Urothelial carcinoma  - VTE ppx: SCDs , start subcu heparin prophylactically     ENDOCRONOLOGY/RHEUMATOLOGY:   #Diabetes Mellitus type 2  ISS, goal less than 180     Discussed with daughter at bedside, discussed with general surgery.  Discussed with nephrology.    This patient is critically ill with life-threatening condition(s) and a high probability of sudden clinically significant deterioration due to the condition(s) noted in the assessment and plan, which requires the highest level of physician/advance practice provider preparedness to intervene urgently. Full attention to the direct care of this patient was provided for the period of time noted below. Any critical care time performed today is exclusive of teaching and billable procedures and not overlapping with any other physicians or advance practice providers.    I have personally assessed the patient and based my assessment and medical decision-making on a review of the patient's history and 24-hour interval events along with  medical records, physical examination, vital signs, analysis of recent laboratory results, evaluation of radiology images, monitoring data for potential decompensation, and additional findings found in detail within ICU team notes. The findings and plan of care was discussed with the care team.    Total critical care time: 55 minutes during this encounter.    Elan Brainerd Sudie Bailey, MD   07/01/2022 2:50 PM

## 2022-07-01 NOTE — Treatment Plan (Cosign Needed)
4 eyes in 4 hours pressure injury assessment note:      Completed with:   Unit & Time admitted:              Bony Prominences: Check appropriate box; if wound is present enter wound assessment in LDA     Occiput:                 [x]WNL  [] Wound present  Face:                     [x]WNL  [] Wound present  Ears:                      [x]WNL  [] Wound present  Spine:                    [x]WNL  [] Wound present  Shoulders:             [x]WNL  [] Wound present  Elbows:                  [x]WNL  [] Wound present  Sacrum/coccyx:     [x]WNL  [] Wound present  Ischial Tuberosity:  [x]WNL  [] Wound present  Trochanter/Hip:      [x]WNL  [] Wound present  Knees:                   [x]WNL  [] Wound present  Ankles:                   [x]WNL  [] Wound present  Heels:                    [x]WNL  [] Wound present  Other pressure areas:  [] Wound location       Device related: [] Device name:         LDA completed if wound present: yes/no  Consult WOCN if necessary    Other skin related issues, ie tears, rash, etc, document in Integumentary flowsheet

## 2022-07-01 NOTE — Progress Notes (Signed)
[facility] PROGRESS NOTE      Date of Note: 07/01/2022   Patient Name: Shelby Barron     Date of Birth:  December 28, 1940     MRN:               [id number]     PCP:               Patsy Lager, MD     I am available on epic chart and [employer] link extension [phone] Monday - Friday 8:30-16:30. If urology consultation is needed outside these hours please contact [facility] at [phone].      ASSESSMENT/  PLAN     82 yo female with hx of renal failure, renal tumor, bladder cancer. Converted to HD May 2024.     -medical management per primary, nephrology, general surgery    -diet per general surgery   -daily labs   -transfuse prn   -abx per primary  -no additional recs to add from GU standpoint     I will discuss/discussed the plan of care with the following services:  Dr. Elijio Miles, Urology   Nursing at bedside     I spent a total of 20 minutes involved in this patient's care    Sheldon Silvan, PA  07/01/2022, 10:53 AM    SUBJECTIVE     Interval History:     6/13: extubated. Continues on CRRT.     6/12: back to OR with GS for closure of abd wall/mesenteric defect and small bowel anastomosis. Undergoing CRRT.     6/11: in ICU requiring norepinephrine, intubated and sedated      6/10: Radical cystectomy, bilateral  open radical nephrectomies, general surgery consulted intra-op for enterotomy and small bowel resection, patient left in discontinuity as she developed acute blood loss requiring pressors. EBL: 1L.       Patient Active Problem List    Diagnosis Date Noted    Anemia 06/30/2022    COPD (chronic obstructive pulmonary disease) 06/30/2022    Bladder cancer 06/28/2022    Malignant neoplasm of urinary bladder, unspecified site 06/25/2022    Malignant tumor of kidney, left 06/25/2022    GERD (gastroesophageal reflux disease) 06/25/2022    Leukocytosis 06/25/2022    Chronic diastolic congestive heart failure 06/23/2022    Aortic atherosclerosis 06/23/2022    Peritonitis 05/26/2022    ESRD (end stage renal disease)  05/26/2022    Hamstring injury 05/10/2013    Benign essential hypertension 02/10/2013    Left renal mass 11/28/2012    Type 2 diabetes mellitus with kidney complication, with long-term current use of insulin 07/31/2010    Diabetes mellitus type II, uncontrolled 07/31/2010    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  No retractions or increased work of breathing.   Abdomen: non-distended,    LABS & IMAGING     CBC  Recent Labs     07/01/22  0506 06/30/22  2310 06/30/22  1646   WBC 23.99* 25.27* 26.44*   RBC 2.67* 2.86* 2.91*   Hematocrit 23.9* 25.3* 26.1*   MCV 89.5 88.5 89.7   MCH 29.6 30.4 30.2   MCHC 33.1 34.4 33.7   RDW 16* 15 15   MPV 11.3 11.1 11.1       BMP  Recent Labs     07/01/22  0506 06/30/22  2310 06/30/22  1646   CO2 21 20 16*   Anion Gap 10.0 10.0 12.0   BUN 18 21 26*   Creatinine 2.8* 3.6* 4.9*       INR  Lab Results   Component Value Date/Time    INR 1.0 07/01/2022 05:06 AM    INR 1.1 06/01/2022 03:15 AM         IMAGING:  XR Chest AP Portable   Final Result         1. Lines and tubes in adequate position. No pneumothorax.   2. Trace left pleural effusion and bibasilar atelectasis.      Jasmine December D'Heureux, MD   06/30/2022 4:53 PM      XR Chest AP Portable   Final Result      Catheters as described.      Pankaj Dominica, MD   06/30/2022 2:07 PM      XR Chest AP Portable   Final Result    Right mainstem bronchial intubation. The tube should be   withdrawn about 4 cm.      Dr. Tildon Husky is aware.      Wynema Birch, MD   06/28/2022 4:56 PM      XR OR MISCOUNT   Final Result    No unexpected foreign body.      These critical results were discussed with and acknowledged by Angie R.,   the circulating nurse, who will communicate the findings to Everardo All,   MD on 06/28/2022 3:02 PM.         Bosie Helper, MD   06/28/2022 3:03 PM             Malnutrition Documentation    Moderate Malnutrition related to inadequate nutritional intake in the setting of acute illness as evidenced by <50% EER > 5 days, mild muscle losses (temporalis, deltoid) - new

## 2022-07-01 NOTE — Op Note (Signed)
FULL OPERATIVE NOTE    Date Time: 07/01/22 7:09 AM  Patient Name: Shelby Barron, Shelby Barron (MRN: [id number])  Attending Physician: Winona Legato, Delynn Flavin, MD      Date of Operation:   06/28/2022    Providers Performing:   Surgeons and Role:  Panel 1:     * Neldon Newport, MD - Primary     * Adele Dan, MD - Resident - Assisting     * Marlaine Hind, MD - Assistant Surgeon     * Nonda Lou, MD  Panel 2:     Marlaine Hind, MD - Primary    Surgical First Assistant(s):   First Assistant: Lynnda Shields    Operative Procedure:   Panel 1  CYSTECTOMY, RADICAL: 54098 (CPT)  BILATERAL OPEN RADICAL NEPHRECTOMY AND LYSIS OF ASHESION:   Panel 2  RESECTION SMALL BOWEL:   ABTHERA WOUND VAC APPLICATION:   SEROSAL INJURY REPAIR    Preoperative Diagnosis:   Pre-Op Diagnosis Codes:     * Malignant neoplasm of urinary bladder, unspecified site [C67.9]     * Malignant tumor of kidney, left [C64.2]    Postoperative Diagnosis:   Post-Op Diagnosis Codes:     * Malignant neoplasm of urinary bladder, unspecified site [C67.9]     * Malignant tumor of kidney, left [C64.2]    Anesthesia:   general    Findings:   Bowel adhered to abdominal wall at mesh, enterotomy found and small bowel section resected. 1 cm serosal injury repaired. Patient left in discontinuity given pressor requirement and acute blood loss anemia to hemoglobin 4.1.     Indications:   Please see Dr. Nonda Lou and Dr. Ocie Doyne Desai's operative report for indication of surgery.  General surgery intraoperative consult was requested after an enterotomy was found when attempting to enter the abdomen by Dr. Graciela Husbands and Dr. Celine Mans.    Operative Notes:   Please see Dr. Nonda Lou and Dr. Ocie Doyne Desai's operative report for details of their portion of the surgery.    Patient had a prior umbilical hernia repair with mesh.  Upon entering the abdomen and enterotomy was found along with multiple loops of bowel that were very adherent to the mesh.  Careful lysis of adhesion was performed using  combination of sharp and blunt dissection.  There were also interloop adhesions that were lysed.  Once the small bowel loops were completely freed, the loops were inspected.  The small bowel segment containing the enterotomy was inspected.  Approximately, 15 cm segment of small bowel containing the area of enterotomy appeared to be scarred and bruised.  Decision was made to resect this segment which was in mid ileum.  A point of transection was selected both proximally and distally.  A window was created in the mesentery and the small bowel divided using Endo GIA stapler with purple load.  The mesentery was divided using LigaSure and the small bowel was passed off the specimen.  Rest of the small bowel was inspected and another 1 cm serosal injury noted in proximal ileum which was repaired using 3-0 Vicryl in interrupted fashion.  No other injuries were or abnormalities noted.  Of note, there was no spillage of enteric content from the enterotomy site.    At this point, Dr. Graciela Husbands and Dr. Celine Mans continued with their portion of the procedure and the small bowel was to be anastomosed after the conclusion of the procedure.  Please see their operative report for details.    After cystectomy  and bilateral nephrectomies were performed patient was found to have hemoglobin of 4.1 due to intraoperative blood loss.  In addition, patient had ongoing vasopressor requirement to maintain her blood pressure. After discussion with Dr. Celine Mans and Dr. Graciela Husbands, decision was made to leave the patient in discontinuity with open abdomen with plans to return to OR for reanastomosis, after she has been adequately resuscitated in the ICU.    I scrubbed back in.  Quickly reexamined the abdominal cavity with assistance of Dr. Celine Mans and Dr. Graciela Husbands.  There was minimal oozing noted from surgical bed.  Surgicel powder applied to obtain hemostasis.  The bowel was quickly examined.  There was a large mesenteric defect in the right lower quadrant  otherwise all bowel was viable.  Decision was made to close the mesenteric defect on return to the OR in order to bring patient to the ICU expeditiously for further resuscitation and management.  The small bowel was placed back into the abdomen.  An ABThera wound VAC was placed over the bowel and secured in place in standard fashion, placed on negative pressure.    Patient was kept intubated and brought to the ICU in stable but guarded condition. All instrument and sponge counts were correct.     EBL of 10 ml for General Surgery portion of the case.            Estimated Blood Loss:   1000 mL    Implants:   * No implants in log *       Drains:   Drains: no      Specimens:     ID Type Source Tests Collected by Time Destination   1 : small bowel Tissue Small Intestine (Specify if applicable) SURGICAL PATHOLOGY Neldon Newport, MD 06/28/2022 1007    2 : Right kidney Tissue Kidney, Right SURGICAL PATHOLOGY Neldon Newport, MD 06/28/2022 1121    3 : LEFT KIDNEY Tissue Kidney, Left SURGICAL PATHOLOGY Neldon Newport, MD 06/28/2022 1222    4 : BLADDER Tissue Urinary bladder SURGICAL PATHOLOGY Neldon Newport, MD 06/28/2022 1400          Complications:   None    Signed by: Marlaine Hind, MD  [facility]

## 2022-07-01 NOTE — Progress Notes (Signed)
Nutritional Support Services  Nutrition Follow-up    Shelby Barron 82 y.o. female   MRN: [id number]    Summary of Nutrition Recommendations:    1. Initiate 1L standard TPN @ 13ml/hr + SMOF lipids @ 8.56ml/hr via Central line   Provides 909 kcals, 50g protein   Meets 70% kcal needs, ~45% protein needs    2. Electrolyte management per Pharmacy    3. TPN advancement pending tolerance    4. Monitor GI function and pt's ability to take PO    -----------------------------------------------------------------------------------------------------------------    Patient discussed during rounds                                                        ASSESSMENT DATA     Subjective Nutrition: Pt NPO x 3 days. Spoke with MD, suspecting pt to continue NPO at this time and likely once pt is able to initiate PO, will have poor intake. Noted pt is malnourished. Hypoactive BS, no BM. Noted pt with increased nutritional needs and not appropriate for excessive fluid provision r/t CRRT, TPN is appropriate. MD provided order to initiate TPN. Line access discussed, confirmed that pt can have dedicated line for TPN.       Medical Hx:  has a past medical history of Acid reflux, Asthma, well controlled, Cancer of kidney, Chronic lower back pain, Congestive heart disease, COPD (chronic obstructive pulmonary disease), CVA (cerebral infarction) (01/12/2003), Diabetes, Diabetic nephropathy, Fibroids, Gout, H/O: gout, Hyperlipidemia, Hypertension, Neuropathy of hand, and SOB (shortness of breath).       Orders Placed This Encounter   Procedures    Diet NPO effective now       ANTHROPOMETRIC  Height: 160 cm (5' 2.99")  Weight: 96.7 kg (213 lb 3 oz)  Weight Change: 0.73  Body mass index is 37.77 kg/m.       ESTIMATED NEEDS    Total Daily Energy Needs: 1305 to 1740 kcal  Method for Calculating Energy Needs: 15 kcal - 20 kcal per kg  at 87 kg (Actual body weight)  Rationale: obese, critical illness, CRRT       Total Daily Protein Needs: 104.4 to 130.5  g  Method for Calculating Protein Needs: 2 g - 2.5 g per kg at 52.2 kg (Ideal body weight)  Rationale: obese, critical illness, CRRT      Total Daily Fluid Needs: 1305 to 1740 ml  Method for Calculating Fluid Needs: 1 ml per kcal energy = 1305 to 1740 kcal  Rationale: bmi, age    Pertinent Medications: cefepime, flagyl, SSI med dose  IVF:     calcium GLUConate CRRT infusion 40 mL/hr (07/01/22 1126)    heparin 10,000 units in normal saline 20 mL syringe (CRRT) 500 Units/hr (07/01/22 0716)    prismaSATE 4/2.5 1,500 mL/hr at 07/01/22 1129    prismaSATE 4/2.5 1,000 mL/hr at 07/01/22 0808       Pertinent labs:  Recent Labs     07/01/22  1120 07/01/22  0506 06/30/22  2310 06/30/22  1226 06/30/22  0544 06/29/22  1135 06/29/22  0406 06/28/22  2010 06/28/22  1645   Glucose 135* 151* 158*   < > 144*   < > 338*   < > 274*   Sodium 138 136 137   < > 136   < >  134*   < > 138   Potassium 4.8 4.8 4.9   < > 5.0   < > 4.2   < > 4.5   Calcium 10.0 9.3 9.3   < > 9.6   < > 8.0   < > 7.8*   Magnesium 2.5 2.5 2.5   < > 2.2   < > 2.4   < > 1.7   Phosphorus 3.0 3.4 4.0   < > 4.7   < > 7.1*   < > 8.4*   Chloride 106 105 107   < > 107   < > 102   < > 105   CO2 22 21 20    < > 21   < > 18   < > 20   Triglycerides  --  163*  --   --   --   --   --   --  150*   AST (SGOT)  --  25  --   --  37  --  38  --   --    ALT  --  18  --   --  19  --  19  --   --    Alkaline Phosphatase  --  68  --   --  47  --  34*  --   --    Bilirubin, Total  --  0.4  --   --  0.4  --  0.6  --   --     < > = values in this interval not displayed.                                                          MONITORING/EVALUATION     Goals:    1. Patient will meet >80% of nutritional needs via tube feeding by next RD follow up - d/c, not relevant  2. Patient will tolerate PN initiation/advancement by next RD follow up - new     Nutrition Risk Level: High (will follow up at least 2 times per week and PRN)        Baxter Hire, RD, CNSC  Clinical Dietitian  [phone]

## 2022-07-01 NOTE — Anesthesia Postprocedure Evaluation (Signed)
Anesthesia Post Evaluation    Patient: Shelby Barron    Procedure(s) with comments:  SMALL BOWEL ANASTOMOSIS, CLOSURE OF MESENTERIC DEFECT, CLOSURE OF ABDOMINAL WALL - **IP-2910.01/ MD AVAIL [id number] OR AFTER 1600**  WOUND VAC APPLICATION    Anesthesia type: general    Last Vitals:   Vitals Value Taken Time   BP 152/89 07/01/22 0600   Temp 35.4 C (95.7 F) 07/01/22 0600   Pulse 87 07/01/22 0600   Resp 22 07/01/22 0600   SpO2 93 % 07/01/22 0600                 Anesthesia Post Evaluation:     Patient Evaluated: PACU  Patient Participation: complete - patient participated  Level of Consciousness: sleepy but conscious  Pain Score: 0  Pain Management: satisfactory to patient  Non-opioid multimodal analgesia not used between 6 hours prior to anesthesia start and PACU discharge    Airway Patency: patent        Anesthetic complications: No      PONV Status: none    Cardiovascular status: acceptable  Respiratory status: acceptable  Hydration status: acceptable          Signed by: Maryelizabeth Rowan, MD, 07/01/2022 6:35 AM

## 2022-07-01 NOTE — Progress Notes (Signed)
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 s/p bilateral radical nephrectomies and  cystectomy on 06/28/22  - complicated by small bowel enterotomy and blood loss - patient left in discontinuity   - plan to RTOR this AM  Hypotension post op - on levophed  High grade urothelial bladder CA: s/p bilateral nephrectomies and cystectomy c/b mall bowel enterotomy and blood loss (~1L) on 06/28/22  RCC s/p partial nephrectomy  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 (approx 1L) - hgb below goal - transfuse as needed  Hyponatremia - resolved   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  Secondary hyperPTH of renal origin    Recommendations:     Cont CRRT for now as she ris bradycardic and hypothermic - at high risk of decompensation  Cont current dose of heparin with CRRT  Keep I=O    Case discussed with: daughter, ICU RN    Alla German, MD, MD  [facility]  [phone] (office)  X [phone] (FFX Spectra-Link)      Subjective: intubated and sedated. CRRT running well without any problems overnight    Review of Systems:   Unable to assess, pt is sedated    Physical Exam:     Vitals:    07/01/22 0200 07/01/22 0300 07/01/22 0400 07/01/22 0457   BP: 149/66 162/72     Pulse: (!) 47 (!) 49     Resp: 22 22     Temp: (!) 95.9 F (35.5 C) (!) 96.1 F (35.6 C)     TempSrc: Esophageal Esophageal     SpO2: 100% 100%  100%   Weight:   96.7 kg (213 lb 3 oz)    Height:           Intake and Output Summary (Last 24 hours) at Date Time    Intake/Output Summary (Last 24 hours) at  07/01/2022 0510  Last data filed at 07/01/2022 0300  Gross per 24 hour   Intake 1244.48 ml   Output 330 ml   Net 914.48 ml       General: sedated and intubated  Cardiovascular: regular rate and rhythm  Lungs: bilateral air entry  Abdomen: soft  Extremities: edema noted in her hands    Access: TDC    Meds:      Scheduled Meds: PRN Meds:    insulin lispro, 1-8 Units, Subcutaneous, Q4H SCH  micafungin, 100 mg, Intravenous, Q24H  piperacillin-tazobactam, 4.5 g, Intravenous, Q8H          Continuous Infusions:   calcium GLUConate CRRT infusion 50 mL/hr (07/01/22 0300)    fentaNYL 100 mcg/hr (07/01/22 0318)    heparin 10,000 units in normal saline 20 mL syringe (CRRT) 500 Units/hr (06/30/22 1845)    norepinephrine 4 mcg/min (07/01/22 0300)    prismaSATE 4/2.5 1,500 mL/hr at 07/01/22 0443    prismaSATE 4/2.5 1,000 mL/hr at 07/01/22 0443    propofol 10 mcg/kg/min (07/01/22 0305)    dextrose, 15 g of glucose, PRN   Or  dextrose, 12.5 g, PRN   Or  dextrose, 12.5 g, PRN   Or  glucagon (rDNA), 1 mg, PRN  fentaNYL (PF), 50 mcg, Q15 Min PRN  fentaNYL, 50 mcg, Q15 Min PRN  magnesium sulfate, 1 g, PRN  potassium chloride, 20 mEq, PRN  sodium chloride (PF), 10 mL, PRN  sodium phosphates 20 mmol in dextrose 5 % 250 mL IVPB, 20 mmol, PRN              Labs:     Recent Labs   Lab 06/30/22  2310 06/30/22  1646 06/30/22  1226   WBC 25.27* 26.44* 19.39*   Hemoglobin 8.7* 8.8* 8.7*   Hematocrit 25.3* 26.1* 25.8*   Platelet Count 105* 113* 100*     Recent Labs   Lab 06/30/22  2310 06/30/22  1646 06/30/22  1226   Sodium 137 135 136   Potassium 4.9 5.6* 5.4*   Chloride 107 107 108   CO2 20 16* 17   BUN 21 26* 23*   Creatinine 3.6* 4.9* 4.6*   Calcium 9.3 9.2 9.2   Albumin 1.6* 1.8* 1.7*   Phosphorus 4.0 5.9* 6.0*   Magnesium 2.5 2.4 2.3   Glucose 158* 222* 185*   GFR 11.9* 8.2* 8.8*           Signed by: Alla German, MD, MD

## 2022-07-01 NOTE — Nursing Progress Note (Signed)
4 eyes in 4 hours pressure injury assessment note:      Completed with:   Unit & Time admitted:              Bony Prominences: Check appropriate box; if wound is present enter wound assessment in LDA     Occiput:                 [x] WNL  []  Wound present  Face:                     [x] WNL  []  Wound present  Ears:                      [x] WNL  []  Wound present  Spine:                    [x] WNL  []  Wound present  Shoulders:             [x] WNL  []  Wound present  Elbows:                  [x] WNL  []  Wound present  Sacrum/coccyx:     [x] WNL  []  Wound present  Ischial Tuberosity:  [x] WNL  []  Wound present  Trochanter/Hip:      [x] WNL  []  Wound present  Knees:                   [x] WNL  []  Wound present  Ankles:                   [x] WNL  []  Wound present  Heels:                    [x] WNL  []  Wound present  Other pressure areas: [x]  Wound location; Abdomen       Device related: []  Device name:         LDA completed if wound present: yes/no  Consult WOCN if necessary    Other skin related issues, ie tears, rash, etc, document in Integumentary flowsheet

## 2022-07-01 NOTE — Consults (Addendum)
Infectious Diseases and Tropical Medicine Consult  Shelby Child, MD          Date Time: 07/01/22 1:25 PM  Patient Name: Shelby Barron  Referring Physician: Winona Barron, Shelby Flavin, MD      Reason for Consultation:     Abdominal infection    Assessment:     History of bladder cancer and renal failure  Status post radical cystectomy and lysis of adhesion  S/p bowel resection-awaiting return of bowel function  No evidence of aspiration  Leukocytosis improving  Thrombocytopenia worsening    Recommendations:     Discontinue Zosyn (thrombocytopenia)  Discontinue Mycamine  Start cefepime and Flagyl  Follow-up chest x-ray  Probiotics   Monitor electrolytes and renal functions closely  Monitor clinically          Discussed with patient  Discussed with nursing staff  Discussed with pharmacy  Discussed with Dr. Winona Barron                                                           History of Present Illness:     Shelby Barron is an 82 year old female with a history of hypertension, hyperlipidemia, diabetes mellitus, diabetic nephropathy, COPD/asthma, CVA, history of bladder cancer and renal failure is admitted since 06/28/2022 electively for radical cystectomy and lysis of adhesions.  Patient also underwent small bowel resection and wound VAC placement.  Temperature is 96.9, blood pressure 147/68, pulse is 105 bpm, leukocyte count is 18.7, test for COVID-19 and influenza screen is negative, urine culture is unremarkable, chest x-ray done yesterday shows bibasilar atelectasis and trace left pleural effusion.  Patient is currently on Zosyn and Mycamine.    Past Medical History:   Diagnosis Date    Acid reflux     Asthma, well controlled     Cancer of kidney     Chronic lower back pain     Congestive heart disease     COPD (chronic obstructive pulmonary disease)     CVA (cerebral infarction) 01/12/2003    Diabetes     Diabetic nephropathy     Fibroids     Gout     H/O: gout     Hyperlipidemia     Hypertension     Neuropathy of  hand     SOB (shortness of breath)         Past Surgical History:   Procedure Laterality Date    APPENDECTOMY (OPEN)      CLOSURE, ENTEROTOMY, SMALL INTESTINE  06/28/2022    Procedure: CLOSURE, ENTEROTOMY, SMALL INTESTINE;  Surgeon: Shelby Hind, MD;  Location: [facility];  Service: General;;    CYSTECTOMY, ILEOCONDUIT N/A 06/28/2022    Procedure: CYSTECTOMY, RADICAL;  Surgeon: Shelby Newport, MD;  Location: [facility];  Service: Urology;  Laterality: N/A;    DRAIN (OTHER) N/A 05/31/2022    Procedure: DRAIN (OTHER);  Surgeon: Shelby Pigeon, MD;  Location: [facility];  Service: Interventional Radiology;  Laterality: N/A;    EXPLORATORY LAPAROTOMY N/A 06/30/2022    Procedure: SMALL BOWEL ANASTOMOSIS, CLOSURE OF MESENTERIC DEFECT, CLOSURE OF ABDOMINAL WALL;  Surgeon: Shelby Hind, MD;  Location: [facility];  Service: General;  Laterality: N/A;  **IP-2910.01/ MD AVAIL [id number] OR AFTER 1600**    EXPLORATORY LAPAROTOMY, RESECTION SMALL BOWEL N/A  06/28/2022    Procedure: RESECTION SMALL BOWEL;  Surgeon: Shelby Hind, MD;  Location: [facility];  Service: General;  Laterality: N/A;    HERNIA REPAIR      HYSTERECTOMY      LAPAROTOMY, NEPHRECTOMY Bilateral 06/28/2022    Procedure: BILATERAL OPEN RADICAL NEPHRECTOMY AND LYSIS OF ASHESION;  Surgeon: Shelby Newport, MD;  Location: [facility];  Service: Urology;  Laterality: Bilateral;    PARTIAL NEPHRECTOMY      tumor removed from kidney  cJanuary 2015    TUNNELED CATH PLACEMENT (PERMCATH) N/A 06/01/2022    Procedure: TUNNELED CATH PLACEMENT;  Surgeon: Shelby Finch, MD;  Location: [facility];  Service: Interventional Radiology;  Laterality: N/A;    TURBT      WOUND VAC APPLICATION N/A 06/28/2022    Procedure: Shelby Barron WOUND VAC APPLICATION;  Surgeon: Shelby Hind, MD;  Location: [facility];  Service: General;  Laterality: N/A;    WOUND VAC APPLICATION N/A 06/30/2022    Procedure: WOUND VAC APPLICATION;  Surgeon: Shelby Hind, MD;  Location: [facility];   Service: General;  Laterality: N/A;        Family History   Problem Relation Age of Onset    Cancer Mother     Heart failure Father     Cancer Maternal Aunt     Diabetes Maternal Aunt     Cancer Paternal Aunt         Social History     Socioeconomic History    Marital status: Married     Spouse name: Not on file    Number of children: Not on file    Years of education: Not on file    Highest education level: Not on file   Occupational History    Not on file   Tobacco Use    Smoking status: Former    Smokeless tobacco: Never   Vaping Use    Vaping status: Never Used   Substance and Sexual Activity    Alcohol use: No    Drug use: No    Sexual activity: Not on file   Other Topics Concern    Not on file   Social History Narrative    Not on file     Social Determinants of Health     Financial Resource Strain: Not on file   Food Insecurity: No Food Insecurity (05/26/2022)    Hunger Vital Sign     Worried About Running Out of Food in the Last Year: Never true     Ran Out of Food in the Last Year: Never true   Transportation Needs: No Transportation Needs (05/26/2022)    PRAPARE - Therapist, art (Medical): No     Lack of Transportation (Non-Medical): No   Physical Activity: Not on file   Stress: Not on file   Social Connections: Unknown (02/01/2022)    Received from [facility]    Social Network     Social Network: Not on file   Intimate Partner Violence: Not At Risk (05/26/2022)    Humiliation, Afraid, Rape, and Kick questionnaire     Fear of Current or Ex-Partner: No     Emotionally Abused: No     Physically Abused: No     Sexually Abused: No   Housing Stability: Unknown (05/26/2022)    Housing Stability Vital Sign     Unable to Pay for Housing in the Last Year: No     Number  of Places Lived in the Last Year: Not on file     Unstable Housing in the Last Year: No        Allergies:     Allergies   Allergen Reactions    Metrizamide Shortness Of Breath and Nausea Only    Rosuvastatin Other (See Comments)      myopathy    myopathy   myopathy    myopathy myopathy      myopathy    myopathy, myopathy    Iodinated Contrast Media Other (See Comments)     Nausea and SOB per patient   On Dialysisi    Nausea and SOB per patient    Nausea and SOB per patient      Nausea and SOB per patient   Nausea and SOB per patient    Nausea and SOB per patient      Nausea and SOB per patient Nausea and SOB per patient      Nausea and SOB per patient Nausea and SOB per patient Nausea and SOB per patient      Nausea and SOB per patient  Nausea and SOB per patient Nausea and SOB per patient    Nausea and SOB per patient   Nausea and SOB per patient    Pioglitazone      Weight gain    Sulfa Antibiotics Hives     blister        Review of Systems:     General:  Comfortable, no fevers or chills  HEENT: no runny nose or sore throat  Respiratory: no cough or shortness of breath, no hematemesis or hemoptysis  Cardiac: no chest pain  Abdomen: no abdominal pain, not passing any gas  Neurologic: Awake  Genitourinary: no hematuria  Extremities: no joint pains or swelling  Dermatologic: no skin rashes, no itching    Physical Exam:     Blood pressure 147/68, pulse (!) 105, temperature (!) 96.9 F (36.1 C), temperature source Axillary, resp. rate 20, height 1.6 m (5' 2.99"), weight 96.7 kg (213 lb 3 oz), SpO2 100%.    General Appearance: Appears comfortable  HEENT:  Head is normocephalic, atraumatic, pupils are equal and reactive to light  Neck:    Supple,No neck lymphadenopathy, no thyromegaly  Lungs:  Clear to auscultation   Chest Wall: Symmetric chest wall expansion.   Heart : Tachycardia  Abdomen: Abdomen is distal ended, wound VAC in place, faint bowel sounds, no hepatosplenomegaly  Neurological: awake  Extremities: No edema, no clubbing or cyanosis  Skin:  Warm and dry.No rash or ecchymosis.     Labs:     Recent Labs     07/01/22  1120 07/01/22  0506   WBC 18.74* 23.99*   Hemoglobin 8.2* 7.9*   Hematocrit 24.1* 23.9*   Platelet Count 99* 107*   MCV  88.9 89.5       Recent Labs     07/01/22  1120 07/01/22  0506   Sodium 138 136   Potassium 4.8 4.8   Chloride 106 105   CO2 22 21   BUN 16 18   Creatinine 2.2* 2.8*   Glucose 135* 151*   Calcium 10.0 9.3   Magnesium 2.5 2.5   Phosphorus 3.0 3.4       Recent Labs     07/01/22  1120 07/01/22  0506 06/30/22  1226 06/30/22  0544   AST (SGOT)  --  25  --  37   ALT  --  18  --  19   Alkaline Phosphatase  --  68  --  47   Protein, Total  --  4.0*  --  3.6*   Albumin 1.6* 1.4*  1.4*   < > 1.8*   Bilirubin, Total  --  0.4  --  0.4    < > = values in this interval not displayed.         Imaging studies:           Thanks for the consultation          Signed by: Shelby Child, MD, MD  Date Time: 07/01/22 1:25 PM    Pre-Visit Planning: Review previous office notes, results and consult notes before patient was seen in office. [ 15 min. ] Actual Visit Time: Actual face to face time with patient and/or family [ 30 min. ]. Post-Visit Planning: Total time spent documenting clinical information in the EMR, interpretation of results, care coordination (referrals). [15 min. ] Total time spent for all three categories during date of service: [60 min. ]    *This note was generated by the Epic EMR system/ Dragon speech recognition and may contain inherent errors or omissions not intended by the user. Grammatical errors, random word insertions, deletions, pronoun errors and incomplete sentences are occasional consequences of this technology due to software limitations. Not all errors are caught or corrected. If there are questions or concerns about the content of this note or information contained within the body of this dictation they should be addressed directly with the author for clarification

## 2022-07-01 NOTE — Progress Notes (Signed)
LOS # 3      Summary of Discharge Plan:Pt to discharge Home with [facility] vs TBD ( recs pending)      Identified Possible Discharge Barriers:Medical stability ( pt hypotensive - on levophed; low hemoglobin)      CM Interventions and Outcome: CM reviewed pt's chart      Discussed above Discharge Plan with (patient, family, Care Team, others): Care Team      Case Management will continue to follow for [place] needs that may arise.     Marylou Mccoy RN BSN CM I  [facility]  [phone]

## 2022-07-01 NOTE — Nursing Progress Note (Addendum)
N: Discontinued Fent + Prop gtt in the AM. X1 fent bolus administered for CPOT >3. Maintained Rass -1. Intermittently follows simple commands. Gag/cough present. BL wrist restraints modified post extubation to BL mitts, for prevention of removing medical equipment and lines. Q2 checks performed, documentation completed.  CV: Sinus brady in the AM, became sinus tachy/SR post extubation. A-line at R. Radial site. Waveform appropriate, Re-zeroing and calibration performed. MAP 70s-90s.   HBG remained stable. R. Femoral CVC removed, site WDL.  RESP: Vent-assisted in the morning, extubated successfully at 9am. Placed on 6L NC with good sats, no increased WOB. Weaned to 2L. Tolerating well.   GI: Abdominal closure site intact, wound vac compression on site. Drainage approx. 30cc. No BM. Plan to place on bowel regimen and TPN in the evening.  Confirmed with MD, nutrition, and IV therapy team ok to run TPN/lipids through pigtail cath.   GU: Anuric. On continuous CRRT. Flow rate 180, Net -0.   SK: Abd wound site and L. Flank site intact with no active bleeding or drainage.  ID: ABX regimen changed per MD.       Problem: Non-Violent Restraints Interdisciplinary Plan  Goal: Will be injury free during the use of non-violent restraints  Outcome: Progressing  Flowsheets (Taken 06/30/2022 0601 by Octaviano Batty, RN)  Will be injury free during the use of non-violent restraints:   Attempt all alternatives before use of restraints   Initiate least restrictive type of restraint that is effective   Provide and maintain safe environment   Notify family of initiation of restraints   Include patient/family/caregiver in decisions related to safety   Ensure safety devices are properly applied and maintained   Document observed patient actions according to protocol   Nurse to accompany patient off unit when on restraints   Remove restraints before the indicated maximum length of time when meets criteria for discontinuation    Document significant changes in patient condition   Provide debriefing as soon as possible and appropriate   Reassess need for continued restraints   Ensure that order for restraints has not expired     Problem: Inadequate Airway Clearance  Goal: Patent Airway maintained  Outcome: Progressing  Flowsheets (Taken 06/29/2022 1023 by Luz Brazen, RN)  Patent airway maintained:   Position patient for maximum ventilatory efficiency   Reposition patient every 2 hours and as needed unless able to self-reposition   Provide adequate fluid intake to liquefy secretions   Suction secretions as needed     Problem: Infection Prevention  Goal: Free from infection  Outcome: Progressing  Flowsheets (Taken 06/29/2022 1023 by Luz Brazen, RN)  Free from infection:   Monitor/assess vital signs   Assess incision for evidence of healing   Monitor/assess lab values and report abnormal values   Encourage/assist patient to turn, cough and perform deep breathing every 2 hours   Monitor/assess output from surgical drain if present   Assess for signs and symptoms of infection     Problem: Impaired Mobility  Goal: Mobility/Activity is maintained at optimal level for patient  Outcome: Progressing  Flowsheets (Taken 06/29/2022 1023 by Luz Brazen, RN)  Mobility/activity is maintained at optimal level for patient:   Encourage independent activity per ability   Consult/collaborate with Physical Therapy and/or Occupational Therapy     Problem: Constipation  Goal: Fluid and electrolyte balance are achieved/maintained  Outcome: Progressing  Flowsheets (Taken 06/30/2022 0601 by Octaviano Batty, RN)  Fluid and electrolyte balance are achieved/maintained:  Monitor/assess lab values and report abnormal values   Assess and reassess fluid and electrolyte status   Observe for cardiac arrhythmias   Monitor for muscle weakness     Problem: Pain  Goal: Pain at adequate level as identified by patient  Outcome: Progressing  Flowsheets  (Taken 07/01/2022 0405 by Con Memos, RN)  Pain at adequate level as identified by patient: Identify patient comfort function goal     Problem: Hemodynamic Status: Cardiac  Goal: Stable vital signs and fluid balance  Outcome: Progressing  Flowsheets (Taken 06/30/2022 0601 by Octaviano Batty, RN)  Stable vital signs and fluid balance:   Assess signs and symptoms associated with cardiac rhythm changes   Monitor lab values     Problem: Fluid and Electrolyte Imbalance/ Endocrine  Goal: Fluid and electrolyte balance are achieved/maintained  Outcome: Progressing  Flowsheets (Taken 06/30/2022 0601 by Octaviano Batty, RN)  Fluid and electrolyte balance are achieved/maintained:   Monitor/assess lab values and report abnormal values   Assess and reassess fluid and electrolyte status   Observe for cardiac arrhythmias   Monitor for muscle weakness     Problem: Inadequate Gas Exchange  Goal: Patent Airway maintained  Outcome: Progressing  Flowsheets (Taken 06/29/2022 1023 by Luz Brazen, RN)  Patent airway maintained:   Position patient for maximum ventilatory efficiency   Reposition patient every 2 hours and as needed unless able to self-reposition   Provide adequate fluid intake to liquefy secretions   Suction secretions as needed

## 2022-07-01 NOTE — Plan of Care (Addendum)
Neuro- GCS of 3-1-5 with sedation ongoing of Propofol and Fentanyl. RASS of -2. On soft Limb restraint to help prevent removal of life sustaining equipment.  CV- known bradycardia, maintaining MAP of 65 mmhg With ongoing Levophed at 4 mcg infusing well at Right femoral  Central line.   Hypothermic. Esophageal probe inserted. Bairhugger warmer applied.  Respi- 0n Vent Fio2 30 %, TV 400, RR 22, Peep 5 on PRVC mode. Saturating well with no desaturation.   GI- NPO. Patent OGT.  GU- anuric. On CVVHDF, no fluid removal as per Nephro, labs reviewed, no changes on the CRRT orders.   Skin- with generalized edema.  Clean, dry, intact surgical incision at abdomen noted upon assessment. Black foam dressing checked on the wound with negative pressure applied through VAC.       Problem: Moderate/High Fall Risk Score >5  Goal: Patient will remain free of falls  Outcome: Progressing  Flowsheets (Taken 06/30/2022 2200)  High (Greater than 13): HIGH-Bed alarm on at all times while patient in bed     Problem: Non-Violent Restraints Interdisciplinary Plan  Goal: Will be injury free during the use of non-violent restraints  Outcome: Progressing  Flowsheets (Taken 06/30/2022 0601 by Octaviano Batty, RN)  Will be injury free during the use of non-violent restraints:   Attempt all alternatives before use of restraints   Initiate least restrictive type of restraint that is effective   Provide and maintain safe environment   Notify family of initiation of restraints   Include patient/family/caregiver in decisions related to safety   Ensure safety devices are properly applied and maintained   Document observed patient actions according to protocol   Nurse to accompany patient off unit when on restraints   Remove restraints before the indicated maximum length of time when meets criteria for discontinuation   Document significant changes in patient condition   Provide debriefing as soon as possible and appropriate   Reassess need  for continued restraints   Ensure that order for restraints has not expired     Problem: SCIP  Goal: SCIP measures are followed  Outcome: Progressing  Flowsheets (Taken 06/29/2022 1023 by Luz Brazen, RN)  SCIP measures are followed:   Administer antibiotics as ordered   VTE Prevention: Administer anticoagulant(s) and/or apply anti-embolism stockings/devices as ordered     Problem: Inadequate Airway Clearance  Goal: Patent Airway maintained  Outcome: Progressing  Flowsheets (Taken 06/29/2022 1023 by Luz Brazen, RN)  Patent airway maintained:   Position patient for maximum ventilatory efficiency   Reposition patient every 2 hours and as needed unless able to self-reposition   Provide adequate fluid intake to liquefy secretions   Suction secretions as needed  Goal: Normal respiratory rate/effort achieved/maintained  Outcome: Progressing  Flowsheets (Taken 06/29/2022 1023 by Luz Brazen, RN)  Normal respiratory rate/effort achieved/maintained: Plan activities to conserve energy: plan rest periods     Problem: Infection Prevention  Goal: Free from infection  Outcome: Progressing  Flowsheets (Taken 06/29/2022 1023 by Luz Brazen, RN)  Free from infection:   Monitor/assess vital signs   Assess incision for evidence of healing   Monitor/assess lab values and report abnormal values   Encourage/assist patient to turn, cough and perform deep breathing every 2 hours   Monitor/assess output from surgical drain if present   Assess for signs and symptoms of infection     Problem: Impaired Mobility  Goal: Mobility/Activity is maintained at optimal level for patient  Outcome: Progressing  Flowsheets (Taken 06/29/2022 1023  by Luz Brazen, RN)  Mobility/activity is maintained at optimal level for patient:   Encourage independent activity per ability   Consult/collaborate with Physical Therapy and/or Occupational Therapy     Problem: Constipation  Goal: Fluid and electrolyte balance are achieved/maintained  Outcome:  Progressing  Flowsheets (Taken 06/30/2022 0601 by Octaviano Batty, RN)  Fluid and electrolyte balance are achieved/maintained:   Monitor/assess lab values and report abnormal values   Assess and reassess fluid and electrolyte status   Observe for cardiac arrhythmias   Monitor for muscle weakness  Goal: Elimination patterns are normal or improving  Outcome: Progressing     Problem: Safety  Goal: Patient will be free from injury during hospitalization  Outcome: Progressing  Flowsheets (Taken 06/29/2022 1023 by Luz Brazen, RN)  Patient will be free from injury during hospitalization:   Assess patient's risk for falls and implement fall prevention plan of care per policy   Use appropriate transfer methods   Include patient/ family/ care giver in decisions related to safety   Assess for patients risk for elopement and implement Elopement Risk Plan per policy   Provide alternative method of communication if needed (communication boards, writing)   Hourly rounding   Ensure appropriate safety devices are available at the bedside   Provide and maintain safe environment  Goal: Patient will be free from infection during hospitalization  Outcome: Progressing  Flowsheets (Taken 06/29/2022 1023 by Luz Brazen, RN)  Free from Infection during hospitalization:   Assess and monitor for signs and symptoms of infection   Monitor all insertion sites (i.e. indwelling lines, tubes, urinary catheters, and drains)   Encourage patient and family to use good hand hygiene technique   Monitor lab/diagnostic results     Problem: Pain  Goal: Pain at adequate level as identified by patient  Outcome: Progressing     Problem: Side Effects from Pain Analgesia  Goal: Patient will experience minimal side effects of analgesic therapy  Outcome: Progressing     Problem: Renal Instability  Goal: Fluid and electrolyte balance are achieved/maintained  Outcome: Progressing  Flowsheets (Taken 06/30/2022 0601 by Octaviano Batty, RN)  Fluid and electrolyte balance are achieved/maintained:   Monitor/assess lab values and report abnormal values   Assess and reassess fluid and electrolyte status   Observe for cardiac arrhythmias   Monitor for muscle weakness   Follow fluid restrictions/IV/PO parameters     Problem: Patient Receiving Advanced Renal Therapies  Goal: Therapy access site remains intact  Outcome: Progressing  Flowsheets (Taken 06/30/2022 0601 by Octaviano Batty, RN)  Therapy access site remains intact:   Assess therapy access site   Change therapy access site dressing as needed     Problem: Fluid and Electrolyte Imbalance/ Endocrine  Goal: Fluid and electrolyte balance are achieved/maintained  Outcome: Progressing  Flowsheets (Taken 06/30/2022 0601 by Octaviano Batty, RN)  Fluid and electrolyte balance are achieved/maintained:   Monitor/assess lab values and report abnormal values   Assess and reassess fluid and electrolyte status   Observe for cardiac arrhythmias   Monitor for muscle weakness  Goal: Adequate hydration  Outcome: Progressing  Flowsheets (Taken 06/29/2022 1023 by Luz Brazen, RN)  Adequate hydration:   Assess mucus membranes, skin color, turgor, perfusion and presence of edema   Assess for peripheral, sacral, periorbital and abdominal edema   Monitor and assess vital signs and perfusion     Problem: Diabetes: Glucose Imbalance  Goal: Blood glucose stable at established goal  Outcome: Progressing  Flowsheets (Taken 06/30/2022 0601 by Octaviano Batty, RN)  Blood glucose stable at established goal:   Monitor lab values   Monitor intake and output.  Notify LIP if urine output is < 30 mL/hour.   Follow fluid restrictions/IV/PO parameters   Include patient/family in decisions related to nutrition/dietary selections   Assess for hypoglycemia /hyperglycemia   Monitor/assess vital signs   Coordinate medication administration with meals, as indicated   Ensure appropriate  diet and assess tolerance   Ensure adequate hydration   Ensure appropriate consults are obtained (Nutrition, Diabetes Education, and Case Management)   Ensure patient/family has adequate teaching materials     Problem: Fluid and Electrolyte Imbalance/ Endocrine  Goal: Adequate hydration  Outcome: Progressing  Flowsheets (Taken 06/29/2022 1023 by Luz Brazen, RN)  Adequate hydration:   Assess mucus membranes, skin color, turgor, perfusion and presence of edema   Assess for peripheral, sacral, periorbital and abdominal edema   Monitor and assess vital signs and perfusion     Problem: Diabetes: Glucose Imbalance  Goal: Blood glucose stable at established goal  Outcome: Progressing  Flowsheets (Taken 06/30/2022 0601 by Octaviano Batty, RN)  Blood glucose stable at established goal:   Monitor lab values   Monitor intake and output.  Notify LIP if urine output is < 30 mL/hour.   Follow fluid restrictions/IV/PO parameters   Include patient/family in decisions related to nutrition/dietary selections   Assess for hypoglycemia /hyperglycemia   Monitor/assess vital signs   Coordinate medication administration with meals, as indicated   Ensure appropriate diet and assess tolerance   Ensure adequate hydration   Ensure appropriate consults are obtained (Nutrition, Diabetes Education, and Case Management)   Ensure patient/family has adequate teaching materials     Problem: Inadequate Gas Exchange  Goal: Patent Airway maintained  Outcome: Progressing  Flowsheets (Taken 06/29/2022 1023 by Luz Brazen, RN)  Patent airway maintained:   Position patient for maximum ventilatory efficiency   Reposition patient every 2 hours and as needed unless able to self-reposition   Provide adequate fluid intake to liquefy secretions   Suction secretions as needed  Goal: Adequate oxygenation and improved ventilation  Outcome: Progressing  Flowsheets (Taken 06/30/2022 0601 by Octaviano Batty, RN)  Adequate oxygenation  and improved ventilation:   Assess lung sounds   Monitor SpO2 and treat as needed   Monitor and treat ETCO2   Provide mechanical and oxygen support to facilitate gas exchange   Position for maximum ventilatory efficiency     Problem: Artificial Airway  Goal: Endotracheal tube will be maintained  Outcome: Progressing  Flowsheets (Taken 06/30/2022 0601 by Octaviano Batty, RN)  Endotracheal  tube will be maintained:   Suction secretions as needed   Keep head of bed at 30 degrees, unless contraindicated   Encourage/perform oral hygiene as appropriate   Perform deep oropharyngeal suctioning at least every 4 hours   Apply water-based moisturizer to lips   Utilize ETT securing device   Maintain and assess integrity of ETT securing device   Support ventilator tubing to avoid pressure from drag of tubing

## 2022-07-01 NOTE — Progress Notes (Signed)
Time out pre-procedure pause performed with MD; RN. Physician/APP gave go ahead. All necessary equipment was available. Patient extubated to 6L nc.

## 2022-07-01 NOTE — Consults (Signed)
VARD consult received for a Picc line placement for TPN. PICC OK by Nephrologist (Dr Mirian Capuchin, R ). Pt currently with a femoral CVC, temporary HD catheter, and aTrialysis catheter with Pigtail. Pt currently has 2 HD catheters in the SVC. VARD team and pt's daughter [facility]) had concern about SVC congestion. Team discussed with Dr Doreatha Martin (critical care MD), bedside RN Dahlia Client), and pt's daughter at bedside. We all agree on avoiding a PICC, Using Pigtail for TPN, and placing Midline & USPIV for all other Infusions.    MIDLINE INSERTION PROCEDURE     Barron,Shelby M  07/01/2022    INDICATIONS: Therapy less than 28 days    The midline procedure, risks, benefits were discussed with thepatient and caregiver.  All questions were answered  patient and caregiver verbalized understanding and agreed to proceed. Midline education/instructions provided to patient and caregiver.    PROCEDURE DETAILS:   The patient was positioned and Ultrasound was used to confirm patency of the Left Branchial vein prior to obtaining venous access. The arm was scrubbed with 2% chlorhexidine per guidelines and a maximal sterile field was established for the patient.  The clinician was attired with cap, mask and sterile gown/gloves prior to start.     Arrow Single Lumen Power Midline:  A sterile cover was sheathed to the Ultrasound probe. The vein was then revisualized and 1% lidocaine injected prior to puncture of the Left Branchial vein with a BARD PowerGlide needle under direct sonographic guidance and inserted per manufacturer guidelines. The catheter was flushed with normal saline to confirm brisk blood return and capped. The catheter was stabilized on the skin using a securement device. Antimicrobial disk and sterile transparent occlusive dressing applied using aseptic technique. Dressing dated and initialed.    Patient did tolerate the procedure well.   Catheter Type: Arrow Single Lumen Power Midline:  Insertion Site: Left Branchial vein  Total  length: 15 cm  Internal Length:  15 cm  External Length: 0 cm  UAC: 27 cm    Midline Reference #: [id number]  Midline Kit Lot#: [id number]  Midline Kit expiration date: 2023-04-18    Findings/Conclusions:  No signs of bleeding or symptoms of nerve irritation noted at time of insertion procedure.    Tip location is below the level of the axilla in LEFT Branchial vein. Midline is ready for immediate use.    Midline is ready for immediate use    Morene Rankins, RN

## 2022-07-02 LAB — RENAL FUNCTION PANEL
Albumin: 1.4 g/dL — ABNORMAL LOW (ref 3.5–5.0)
Albumin: 1.4 g/dL — ABNORMAL LOW (ref 3.5–5.0)
Albumin: 1.4 g/dL — ABNORMAL LOW (ref 3.5–5.0)
Anion Gap: 5 (ref 5.0–15.0)
Anion Gap: 7 (ref 5.0–15.0)
Anion Gap: 6 (ref 5.0–15.0)
BUN: 14 mg/dL (ref 7–21)
BUN: 14 mg/dL (ref 7–21)
BUN: 13 mg/dL (ref 7–21)
CO2: 25 mEq/L (ref 17–29)
CO2: 26 mEq/L (ref 17–29)
CO2: 26 mEq/L (ref 17–29)
Calcium: 8.3 mg/dL (ref 7.9–10.2)
Calcium: 10.3 mg/dL — ABNORMAL HIGH (ref 7.9–10.2)
Calcium: 10.6 mg/dL — ABNORMAL HIGH (ref 7.9–10.2)
Chloride: 106 mEq/L (ref 99–111)
Chloride: 105 mEq/L (ref 99–111)
Chloride: 105 mEq/L (ref 99–111)
Creatinine: 1.4 mg/dL — ABNORMAL HIGH (ref 0.4–1.0)
Creatinine: 1.2 mg/dL — ABNORMAL HIGH (ref 0.4–1.0)
Creatinine: 1.2 mg/dL — ABNORMAL HIGH (ref 0.4–1.0)
GFR: 36.9 mL/min/{1.73_m2} — ABNORMAL LOW (ref >=60.0)
GFR: 44.4 mL/min/{1.73_m2} — ABNORMAL LOW (ref >=60.0)
GFR: 44.4 mL/min/{1.73_m2} — ABNORMAL LOW (ref >=60.0)
Glucose: 211 mg/dL — ABNORMAL HIGH (ref 70–100)
Glucose: 197 mg/dL — ABNORMAL HIGH (ref 70–100)
Glucose: 196 mg/dL — ABNORMAL HIGH (ref 70–100)
Phosphorus: 1.6 mg/dL — ABNORMAL LOW (ref 2.3–4.7)
Phosphorus: 1.1 mg/dL — ABNORMAL LOW (ref 2.3–4.7)
Phosphorus: 1.4 mg/dL — ABNORMAL LOW (ref 2.3–4.7)
Potassium: 4.2 mEq/L (ref 3.5–5.3)
Potassium: 4.4 mEq/L (ref 3.5–5.3)
Potassium: 4.5 mEq/L (ref 3.5–5.3)
Sodium: 136 mEq/L (ref 135–145)
Sodium: 138 mEq/L (ref 135–145)
Sodium: 137 mEq/L (ref 135–145)

## 2022-07-02 LAB — HEPATIC FUNCTION PANEL (LFT)
ALT: 18 U/L (ref 0–55)
AST (SGOT): 23 U/L (ref 5–41)
Albumin/Globulin Ratio: 0.6 — ABNORMAL LOW (ref 0.9–2.2)
Albumin: 1.5 g/dL — ABNORMAL LOW (ref 3.5–5.0)
Alkaline Phosphatase: 64 U/L (ref 37–117)
Bilirubin Direct: 0.2 mg/dL (ref 0.0–0.5)
Bilirubin Indirect: 0.1 mg/dL — ABNORMAL LOW (ref 0.2–1.0)
Bilirubin, Total: 0.3 mg/dL (ref 0.2–1.2)
Globulin: 2.5 g/dL (ref 2.0–3.6)
Protein, Total: 4 g/dL — ABNORMAL LOW (ref 6.0–8.3)

## 2022-07-02 LAB — CBC
Absolute nRBC: 0.03 10*3/uL — ABNORMAL HIGH (ref <=0.00)
Hematocrit: 22.8 % — ABNORMAL LOW (ref 34.7–43.7)
Hemoglobin: 7.7 g/dL — ABNORMAL LOW (ref 11.4–14.8)
MCH: 30.6 pg (ref 25.1–33.5)
MCHC: 33.8 g/dL (ref 31.5–35.8)
MCV: 90.5 fL (ref 78.0–96.0)
MPV: 10.9 fL (ref 8.9–12.5)
Platelet Count: 102 10*3/uL — ABNORMAL LOW (ref 142–346)
RBC: 2.52 10*6/uL — ABNORMAL LOW (ref 3.90–5.10)
RDW: 15 % (ref 11–15)
WBC: 14.81 10*3/uL — ABNORMAL HIGH (ref 3.10–9.50)
nRBC %: 0.2 /100 WBC — ABNORMAL HIGH (ref <=0.0)

## 2022-07-02 LAB — WHOLE BLOOD GLUCOSE POCT
Whole Blood Glucose POCT: 171 mg/dL — ABNORMAL HIGH (ref 70–100)
Whole Blood Glucose POCT: 197 mg/dL — ABNORMAL HIGH (ref 70–100)
Whole Blood Glucose POCT: 207 mg/dL — ABNORMAL HIGH (ref 70–100)
Whole Blood Glucose POCT: 215 mg/dL — ABNORMAL HIGH (ref 70–100)
Whole Blood Glucose POCT: 181 mg/dL — ABNORMAL HIGH (ref 70–100)
Whole Blood Glucose POCT: 208 mg/dL — ABNORMAL HIGH (ref 70–100)

## 2022-07-02 LAB — MAGNESIUM
Magnesium: 2.5 mg/dL (ref 1.6–2.6)
Magnesium: 2.5 mg/dL (ref 1.6–2.6)

## 2022-07-02 LAB — CALCIUM, IONIZED
Calcium, Ionized: 2.58 mEq/L — ABNORMAL HIGH (ref 2.23–2.56)
Calcium, Ionized: 2.85 mEq/L — ABNORMAL HIGH (ref 2.23–2.56)
Calcium, Ionized: 2.87 mEq/L — ABNORMAL HIGH (ref 2.23–2.56)
Calcium, Ionized: 2.88 mEq/L — ABNORMAL HIGH (ref 2.23–2.56)

## 2022-07-02 MED ORDER — SMOFLIPID (SOY-MCT-OLIVE-FISH OIL) 20% IV INFUSION (ADULT)
20.0000 g | INTRAVENOUS | Status: AC
Start: 2022-07-02 — End: 2022-07-03
  Administered 2022-07-02: 20 g via INTRAVENOUS
  Filled 2022-07-02: qty 100

## 2022-07-02 MED ORDER — POTASSIUM CHLORIDE 10 MEQ/100ML IV SOLN (WRAP)
10.0000 meq | Freq: Once | INTRAVENOUS | Status: AC
Start: 2022-07-02 — End: 2022-07-02
  Administered 2022-07-02: 10 meq via INTRAVENOUS
  Filled 2022-07-02: qty 100

## 2022-07-02 MED ORDER — NALOXONE HCL 0.4 MG/ML IJ SOLN (WRAP)
0.2000 mg | INTRAMUSCULAR | Status: DC | PRN
Start: 2022-07-02 — End: 2022-08-10
  Administered 2022-08-02: 0.2 mg via INTRAVENOUS
  Filled 2022-07-02 (×3): qty 1

## 2022-07-02 MED ORDER — SODIUM PHOSPHATES 3 MMOLE/ML IV SOLN (WRAP)
10.0000 mmol | Freq: Once | INTRAVENOUS | Status: AC
  Administered 2022-07-02: 10 mmol via INTRAVENOUS
  Filled 2022-07-02: qty 3.33

## 2022-07-02 MED ORDER — TRAVASOL 10 % IV SOLN
Status: AC
Start: 2022-07-02 — End: 2022-07-03
  Filled 2022-07-02: qty 500

## 2022-07-02 NOTE — Progress Notes (Signed)
Nutritional Support Services  Nutrition Note    Shelby Barron 82 y.o. female   MRN: [id number]      Nutrition Recommendations:     1. Continue 1L standard TPN @ 10ml/hr + SMOF lipids @ 8.51ml/hr via Central line              Provides 909 kcals, 50g protein              Meets 70% kcal needs, ~45% protein needs     2. Electrolyte management per Pharmacy     3. TPN advancement pending tolerance     4. Monitor GI function and pt's ability to take PO    Brief Nutrition Note: TPN day 1. Pt receiving TPN at 36ml/hr via permacath. Low phos, potentially d/t no phos in PN vs refeeding. Will plan to add phos to bag today, will keep macros the same and monitor. Pt discussed during rounds. Extubated yesterday. Pt remains on CRRT. NPO. Hypoactive BS, no BM      Nutrition Risk Level: High (will follow up at least 2 times per week and PRN)         Baxter Hire, RD, CNSC  Clinical Dietitian  [phone]

## 2022-07-02 NOTE — UM Notes (Signed)
Continued Stay Review  Class: Inpatient  Level of Care: Level 1 (ICU)         Patient Name: Shelby Barron   Date of Birth Apr 02, 1940   MRN: [id number]          Summary:  Shelby Barron is a 82 y.o. female who presented to the hospital "for scheduled cystectomy and completion nephrectomy with urology.  Her last Plavix dose was 6/3, her last dialysis was 6/8.  She had preop clearance with cardiology which included an echo."     "In the OR course was complicated by  Small bowel enterotomy -General surgery called in enterotomy repaired with initial plan to put patient back into continuity"     "Later in the case significant bleeding during nephrectomy/cystectomy.  Approximately 1 L EBL.  Given significant bleeding decision made to leave patient in discontinuity and abdomen open with plans to return to the OR tomorrow."     "In the OR given  3 units PRBC  3000 mL crystalloid  colloid"        Medical History:  Past Medical History:   Diagnosis Date    Acid reflux     Asthma, well controlled     Cancer of kidney     Chronic lower back pain     Congestive heart disease     COPD (chronic obstructive pulmonary disease)     CVA (cerebral infarction) 01/12/2003    Diabetes     Diabetic nephropathy     Fibroids     Gout     H/O: gout     Hyperlipidemia     Hypertension     Neuropathy of hand     SOB (shortness of breath)            Active Problems:  Patient Active Problem List   Diagnosis    Type 2 diabetes mellitus with kidney complication, with long-term current use of insulin    Diabetes mellitus type II, uncontrolled    Neuropathy of hand    Left renal mass    Hamstring injury    Peritonitis    ESRD (end stage renal disease)    Chronic diastolic congestive heart failure    Aortic atherosclerosis    Malignant neoplasm of urinary bladder, unspecified site    Malignant tumor of kidney, left    Benign essential hypertension    GERD (gastroesophageal reflux disease)    Leukocytosis    Bladder cancer    Anemia    COPD  (chronic obstructive pulmonary disease)           Care Day - 07/02/2022    Per Attending Physician Note:  Plan   NEUROLOGICAL:   #History of CVA  #Encephalopathy/delirium  Hold home plavix  Delirium preventing strategy  Avoid benzodiazepine  Pain control, currently using Dilaudid as needed  Remains delirious but improved. Following commands.  Will say her name.     CARDIOVASCULAR:   #Mixed shock - hemorrhagic vs septic: Resolved  Had EBL approximately 1 L in the OR on 6/10 also with small bowel enterotomy  Transfused 3 units PRBC in OR. Received another unit on 6/11 and 6/12  MAP greater than 65  Had TTE as part of preop cardiac clearance  Mild aortic stenosis (AVA 1.7), grade 1 diastolic dysfunction, normal LV/RV systolic function  Normotensive off of vasopressors        PULMONARY:   #Acute hypoxemic respiratory failure  #COPD  Extubated and doing well.  Comfortable on 2 L nasal cannula     RENAL:   #ESRD  CRRT running have increased UF to net -25 mL/h  Serial labs per CRRT protocol  Nephrology following     GASTROINTESTINAL:  #S/P Cystectomy and completion nephrectomy  6/10 underwent cystectomy and nephrectomy complicated by small bowel enterotomy and hemorrhagic shock  Went to the OR on June 12 for small bowel anastomosis, closure of mesenteric defect, closure of abdominal wall with wound VAC application  -No bowel movements or flatus overnight.  Continue TPN while awaiting return of bowel function     INFECTIOUS DISEASE:   Cefepime/Flagyl per ID     HEMATOLOGY/ONCOLOGY:   #Renal cell carcinoma  #Urothelial carcinoma  #Thrombocytopenia  -Plts 102 this morning.  Question related to Zosyn, sepsis?  Will follow  - VTE ppx: SCDs, subcutaneous heparin     ENDOCRONOLOGY/RHEUMATOLOGY:   #Diabetes Mellitus type 2  ISS, goal less than 180          Urology Note:  -medical management per primary, nephrology, general surgery, ID    -diet per general surgery   -daily labs   -abx per ID  -multimodal pain control  -no additional  recs to add from GU standpoint          Per Nephrology Note:  Recommendations:      Continue CRRT with UF today  Plan to transition to HD tomorrow  If filter clots tonight, do not restart  WBC count coming down - will plan for new permcath next week - has quinton in place as permacath stopped working              Vitals:  Patient Vitals for the past 24 hrs:   BP Temp Temp src Pulse Resp SpO2 Height Weight   07/02/22 1600 129/62 97.5 F (36.4 C) Axillary 76 20 95 % -- --   07/02/22 1500 130/63 -- -- 79 (!) 24 94 % -- --   07/02/22 1414 -- -- -- (!) 105 (!) 24 93 % -- --   07/02/22 1400 (!) 141/91 -- -- 99 (!) 23 93 % -- --   07/02/22 1300 140/60 -- -- 93 21 93 % -- --   07/02/22 1200 126/56 (!) 96.3 F (35.7 C) Axillary 72 21 93 % -- --   07/02/22 1100 122/51 -- -- 74 22 92 % -- --   07/02/22 1018 -- -- -- -- 21 -- -- --   07/02/22 1005 -- -- -- -- (!) 23 -- -- --   07/02/22 1000 -- -- -- 80 22 91 % -- --   07/02/22 0959 136/58 -- -- 77 (!) 23 91 % -- --   07/02/22 0900 135/57 -- -- 76 (!) 23 98 % -- --   07/02/22 0800 126/58 (!) 96 F (35.6 C) Axillary 70 19 100 % -- --   07/02/22 0748 -- -- -- 65 17 99 % 1.575 m (5\' 2" ) 97.6 kg (215 lb 2.7 oz)   07/02/22 0700 108/46 -- -- (!) 59 19 99 % -- --   07/02/22 0600 119/58 97.9 F (36.6 C) Axillary 97 22 96 % -- --   07/02/22 0500 113/53 97.7 F (36.5 C) Axillary 66 (!) 25 98 % -- --   07/02/22 0400 112/49 97.9 F (36.6 C) Axillary 64 (!) 27 100 % -- --   07/02/22 0300 116/54 97.9 F (36.6 C) Axillary 66 (!) 25 100 % -- --   07/02/22 0200 133/58 97.5  F (36.4 C) Axillary 90 (!) 23 99 % -- --   07/02/22 0100 123/58 97.9 F (36.6 C) Axillary 76 20 99 % -- --   07/02/22 0000 112/58 -- Axillary 80 21 99 % -- --   07/01/22 2300 114/50 97.7 F (36.5 C) Axillary (!) 103 (!) 27 96 % -- --   07/01/22 2200 155/55 97.9 F (36.6 C) Oral 96 (!) 27 99 % -- --   07/01/22 2100 122/87 97.9 F (36.6 C) Axillary (!) 115 (!) 31 98 % -- --   07/01/22 2000 131/60 97.6 F (36.4 C)  Axillary (!) 110 22 98 % -- --   07/01/22 1820 -- -- -- (!) 103 16 99 % -- --   07/01/22 1800 -- -- -- (!) 108 18 99 % -- --   07/01/22 1720 (!) 112/92 -- -- 97 16 99 % -- --     SpO2: 95 % (07/02/2022  4:00 PM)  O2 Device: Nasal cannula (07/02/2022  8:00 AM)  FiO2: 100 % (07/01/2022  9:00 AM)  O2 Flow Rate (L/min): 2 L/min (07/02/2022  8:00 AM)  Oximetry Probe Site Changed: No (07/02/2022  6:00 AM)      Weight:   Recent Weights for the past 720 hrs (Last 3 readings):   Weight   07/02/22 0748 97.6 kg (215 lb 2.7 oz)   07/01/22 0400 96.7 kg (213 lb 3 oz)   06/30/22 0000 96 kg (211 lb 10.3 oz)       Labs:   Latest Reference Range & Units 07/02/22 05:02 07/02/22 11:22 07/02/22 15:23   WBC 3.10 - 9.50 x10 3/uL 14.81 (H)     Hemoglobin 11.4 - 14.8 g/dL 7.7 (L)     Hematocrit 34.7 - 43.7 % 22.8 (L)     Platelet Count 142 - 346 x10 3/uL 102 (L)     Glucose 70 - 100 mg/dL 161 (H) 096 (H)    Creatinine 0.4 - 1.0 mg/dL 1.4 (H) 1.2 (H)    Calcium 7.9 - 10.2 mg/dL 8.3 04.5 (H)    EGFR >=40.9 mL/min/1.73 m2 36.9 (L) 44.4 (L)    Calcium Ionized 2.23 - 2.56 mEq/L 2.58 (H)  2.87 (H)   Phosphorus 2.3 - 4.7 mg/dL 1.6 (L) 1.1 (L)    Albumin 3.5 - 5.0 g/dL 1.5 (L)  1.4 (L) 1.4 (L)    Bilirubin Indirect 0.2 - 1.0 mg/dL 0.1 (L)             Medications:  Current Facility-Administered Medications   Medication Dose Route Frequency    cefepime  2 g Intravenous Q12H    heparin (porcine)  5,000 Units Subcutaneous Q12H [facility]    insulin lispro  1-8 Units Subcutaneous Q4H [facility]    metroNIDAZOLE  500 mg Intravenous Q8H         One Time Medications:  potassium chloride 10 mEq in 100 mL IVPB (premix)  Dose: 10 mEq  Freq: Once Route: IV  Last Dose: Stopped (07/02/22 0845)  Start: 07/02/22 0730 End: 07/02/22 0845    [id number]   [id number]   [id number]                 sodium phosphates 10 mmol in dextrose 5 % 250 mL IVPB  Dose: 10 mmol  Freq: Once Route: IV  Last Dose: Stopped (07/02/22 1512)  Start: 07/02/22 1115 End: 07/02/22 1512    [id number]   120018   130019   140020    [id number]  [id number]                Continuous IV Infusions:   Adult Central Parenteral Nutrition (TPN) 2-in-1 42 mL/hr at 07/02/22 1700    And    SMOFLIPID (soy-MCT-olive-fish oil) Stopped (07/02/22 0547)    Adult Central Parenteral Nutrition (TPN) 2-in-1      And    SMOFLIPID (soy-MCT-olive-fish oil)      calcium GLUConate CRRT infusion 40 mL/hr (07/02/22 1700)    dexmedeTOMIDine 0.3 mcg/kg/hr (07/02/22 1700)    heparin 10,000 units in normal saline 20 mL syringe (CRRT) 500 Units/hr (07/02/22 0852)    prismaSATE 4/2.5 1,500 mL/hr at 07/02/22 1049    prismaSATE 4/2.5 1,000 mL/hr at 07/02/22 1156           PRN Medications:  HYDROmorphone (DILAUDID) injection 0.5 mg  Dose: 0.5 mg  Freq: Every 2 hours PRN Route: IV  PRN Reasons: moderate pain,severe pain  Start: 07/01/22 0846    04540   09592   14143                 potassium chloride 20 mEq in 50 mL IVPB (premix)  Dose: 20 mEq  Freq: As needed Route: IV  PRN Reason: Hypokalemia  PRN Comment: CRRT electrolyte replacement protocol  Start: 06/29/22 1536    98119   07005   14782   16217   17008   [id number]                  Doree Albee RN, BSN  UR Case Manager  [facility]  [email or web address]  Phone: [phone]  Fax: [phone]  5:11 PM  07/02/2022                UTILIZATION REVIEW CONTACT: Name:  Doree Albee, RN BSN  Utilization Review  [facility]  Address:  [address], [place], [place] [address]  NPI:   [id number]  Tax ID:  [id number]  Phone: [phone], Option 2  Fax:  [phone]    Please use fax number [phone] to provide authorization for hospital services or to request additional information.        NOTES TO REVIEWER:    This clinical review is based on/compiled from documentation provided by the treatment team within the patient's medical record.

## 2022-07-02 NOTE — Progress Notes (Signed)
GENERAL SURGERY  PROGRESS NOTE    Date Time: 07/02/22 9:40 PM  Patient Name: [facility] Day: 4    ASSESSMENT:     Patient Active Problem List   Diagnosis    Type 2 diabetes mellitus with kidney complication, with long-term current use of insulin    Diabetes mellitus type II, uncontrolled    Neuropathy of hand    Left renal mass    Hamstring injury    Peritonitis    ESRD (end stage renal disease)    Chronic diastolic congestive heart failure    Aortic atherosclerosis    Malignant neoplasm of urinary bladder, unspecified site    Malignant tumor of kidney, left    Benign essential hypertension    GERD (gastroesophageal reflux disease)    Leukocytosis    Bladder cancer    Anemia    COPD (chronic obstructive pulmonary disease)       AMARAH DOME is a 82 y.o. female with bladder cancer and renal failure admitted for cystectomy with bilateral nephrectomy complicated by enterotomy requiring small bowel resection in discontinuity and negative pressure temporary abdominal closure given pressure support and need for resuscitation. Returned to the OR 6/12 for re-anastomosis and abdominal closure.  Patient extubated with NG tube discontinued awaiting return of bowel function.    PLAN:   -- NPO, mIVF  -- Monitor for return of bowel function  -- Pain control   -- Transfuse PRN for anemia  -- Abx: zosyn per primary   -- Remainder of care per ICU team     Attending Note:    I, Marlaine Hind, MD, have personally seen and examined this patient and have participated in their care. I agree with all clinical information, including the physical exam, patient history, and planning as documented by the physician assistant/resident. All notes, labs, and imaging were reviewed.  The patient was seen on the date the note was written      Marlaine Hind, MD  [facility]  [phone]     SUBJECTIVE:   No acute overnight events     No longer requiring pressor support     Objective:   Current Vitals:    Vitals:    07/02/22 2000   BP:    Pulse:    Resp:    Temp: 98.4 F (36.9 C)   SpO2:        Intake and Output Summary (Last 24 hours):  I/O last 3 completed shifts:  In: 3524.92 [I.V.:1365.97; IV Piggyback:988.34]  Out: 3559 [Other:3559]    Labs:     Results       Procedure Component Value Units Date/Time    Calcium, Ionized [[id number]]  (Abnormal) Collected: 07/02/22 2123    Specimen: Blood, Venous Updated: 07/02/22 2135     Calcium, Ionized 2.88 mEq/L     Renal Function Panel [[id number]]  (Abnormal) Collected: 07/02/22 1749    Specimen: Blood, Venous Updated: 07/02/22 1814     Glucose 196 mg/dL      Sodium 629 mEq/L      Potassium 4.5 mEq/L      Chloride 105 mEq/L      CO2 26 mEq/L      BUN 13 mg/dL      Calcium 52.8 mg/dL      Creatinine 1.2 mg/dL      Albumin 1.4 g/dL      Phosphorus 1.4 mg/dL      Anion Gap 6.0  GFR 44.4 mL/min/1.73 m2     Calcium, Ionized [[id number]]  (Abnormal) Collected: 07/02/22 1523    Specimen: Blood, Venous Updated: 07/02/22 1559     Calcium, Ionized 2.87 mEq/L     Renal Function Panel [[id number]]  (Abnormal) Collected: 07/02/22 1122    Specimen: Blood, Venous Updated: 07/02/22 1153     Glucose 197 mg/dL      Sodium 782 mEq/L      Potassium 4.4 mEq/L      Chloride 105 mEq/L      CO2 26 mEq/L      BUN 14 mg/dL      Calcium 95.6 mg/dL      Creatinine 1.2 mg/dL      Albumin 1.4 g/dL      Phosphorus 1.1 mg/dL      Anion Gap 7.0     GFR 44.4 mL/min/1.73 m2     Calcium, Ionized [[id number]]  (Abnormal) Collected: 07/02/22 0904    Specimen: Blood, Venous Updated: 07/02/22 0924     Calcium, Ionized 2.85 mEq/L     Hepatic Function Panel (LFT) [[id number]]  (Abnormal) Collected: 07/02/22 0502    Specimen: Blood, Venous Updated: 07/02/22 0745     Bilirubin, Total 0.3 mg/dL      Bilirubin Direct 0.2 mg/dL      Bilirubin Indirect 0.1 mg/dL      AST (SGOT) 23 U/L      ALT 18 U/L      Alkaline Phosphatase 64 U/L      Albumin 1.5 g/dL      Globulin 2.5 g/dL      Albumin/Globulin Ratio 0.6      Protein, Total 4.0 g/dL     Renal Function Panel [[id number]]  (Abnormal) Collected: 07/02/22 0502    Specimen: Blood, Venous Updated: 07/02/22 0546     Glucose 211 mg/dL      Sodium 272 mEq/L      Potassium 4.2 mEq/L      Chloride 106 mEq/L      CO2 25 mEq/L      BUN 14 mg/dL      Calcium 8.3 mg/dL      Creatinine 1.4 mg/dL      Albumin 1.4 g/dL      Phosphorus 1.6 mg/dL      Anion Gap 5.0     GFR 36.9 mL/min/1.73 m2     CBC without Differential [[id number]]  (Abnormal) Collected: 07/02/22 0502    Specimen: Blood, Venous Updated: 07/02/22 0544     WBC 14.81 x10 3/uL      Hemoglobin 7.7 g/dL      Hematocrit 74.2 %      Platelet Count 102 x10 3/uL      MPV 10.9 fL      RBC 2.52 x10 6/uL      MCV 90.5 fL      MCH 30.6 pg      MCHC 33.8 g/dL      RDW 15 %      nRBC % 0.2 /100 WBC      Absolute nRBC 0.03 x10 3/uL     Calcium, Ionized [[id number]]  (Abnormal) Collected: 07/02/22 0502    Specimen: Blood, Venous Updated: 07/02/22 0520     Calcium, Ionized 2.58 mEq/L     Magnesium [[id number]]  (Normal) Collected: 07/02/22 0053    Specimen: Blood, Venous Updated: 07/02/22 0124     Magnesium 2.5 mg/dL     Magnesium [[id number]]  (Normal) Collected: 07/01/22 2230    Specimen:  Blood, Venous Updated: 07/02/22 0108     Magnesium 2.5 mg/dL     Renal Function Panel [[id number]]  (Abnormal) Collected: 07/01/22 2230    Specimen: Blood, Venous Updated: 07/01/22 2308     Glucose 176 mg/dL      Sodium 409 mEq/L      Potassium 4.6 mEq/L      Chloride 107 mEq/L      CO2 22 mEq/L      BUN 15 mg/dL      Calcium 8.8 mg/dL      Creatinine 1.6 mg/dL      Albumin 1.4 g/dL      Phosphorus 2.1 mg/dL      Anion Gap 7.0     GFR 31.4 mL/min/1.73 m2     Calcium, Ionized [[id number]]  (Abnormal) Collected: 07/01/22 2230    Specimen: Blood, Venous Updated: 07/01/22 2246     Calcium, Ionized 2.70 mEq/L             Rads:     Radiology Results (24 Hour)       ** No results found for the last 24 hours. **              Medications:     Current  Facility-Administered Medications   Medication Dose Route Frequency    cefepime  2 g Intravenous Q12H    heparin (porcine)  5,000 Units Subcutaneous Q12H [facility]    insulin lispro  1-8 Units Subcutaneous Q4H SCH    metroNIDAZOLE  500 mg Intravenous Q8H      Adult Central Parenteral Nutrition (TPN) 2-in-1 42 mL/hr at 07/02/22 1900    And    SMOFLIPID (soy-MCT-olive-fish oil) 8.3 mL/hr at 07/02/22 1900    calcium GLUConate CRRT infusion Stopped (07/02/22 1850)    dexmedeTOMIDine 0.5 mcg/kg/hr (07/02/22 2116)    heparin 10,000 units in normal saline 20 mL syringe (CRRT) Stopped (07/02/22 1850)    prismaSATE 4/2.5 Stopped (07/02/22 1850)    prismaSATE 4/2.5 Stopped (07/02/22 1850)     benzocaine, dextrose **OR** dextrose **OR** dextrose **OR** glucagon (rDNA), HYDROmorphone, magnesium sulfate, naloxone, potassium chloride, sodium chloride (PF), sodium phosphates 20 mmol in dextrose 5 % 250 mL IVPB     Physical Exam:     Physical Exam  Vitals reviewed.   Constitutional:       General: She is not in acute distress.     Appearance: She is not ill-appearing, toxic-appearing or diaphoretic.   Eyes:      General: No scleral icterus.  Cardiovascular:      Rate and Rhythm: Normal rate and regular rhythm.      Pulses: Normal pulses.   Pulmonary:      Effort: Pulmonary effort is normal.   Abdominal:      General: There is no distension.      Palpations: Abdomen is soft.      Tenderness: There is no abdominal tenderness.      Comments: Wound vac in place holding suction, serosanguinous output in canister   Skin:     General: Skin is warm.      Coloration: Skin is not jaundiced.   Neurological:      Comments: GCS 14

## 2022-07-02 NOTE — Progress Notes (Signed)
Parenteral Nutrition Pharmacist Monitoring  PN Day: 2  Indication of Use: SBO    Step 1: Assess Appropriateness of Parenteral Nutrition   Current diet: NPO  Current line access: Permacath - Internal Jugular (right)  DDIs: none  Recommendation: Patient is an appropriate candidate for TPN  If the patient has a functional GI tract, PN is not appropriate. Patients who are well-nourished should be NPO for at least 7 days before initiating PN. Patients at nutritional risk should be NPO for 3-5 days. Malnourished patients should start PN now. If expected duration of use is < 7 days, PPN should be used. PN is not recommended at end-of-life.     Step 2: Assess Electrolytes and Labs  Labs ordered for tomorrow : Yes  TG trend: 150>163  Recent Labs   Lab 07/02/22  0502 07/01/22  2230 07/01/22  1705   Sodium 136 136 137   Potassium 4.2 4.6 4.9   Chloride 106 107 107   CO2 25 22 24    BUN 14 15 14    Creatinine 1.4* 1.6* 1.8*   GFR 36.9* 31.4* 27.3*   Glucose 211* 176* 132*   Calcium 8.3 8.8 9.5       Recent Labs     07/02/22  0053 07/01/22  2230 07/01/22  1705   Magnesium 2.5 2.5 2.5     Recent Labs     07/02/22  0502 07/01/22  2230 07/01/22  1705   Phosphorus 1.6* 2.1* 2.6     Recent Labs   Lab 07/02/22  0502 07/01/22  2230 07/01/22  1705   Albumin 1.5*  1.4* 1.4* 1.5*       Recent Labs   Lab 07/01/22  0506 06/28/22  1645   Triglycerides 163* 150*       Cations WNL? Trend Amount   in current PN Plan   Sodium Yes stable 70 meq same   Potassium Yes trending down 0 20 meq   Calcium Yes (Ionized Ca++high) stable 0 0   Magnesium Yes stable 0 0   Anions       Phosphate No   trending down 0 10 mM (will replace additional 10 mM outside TPN bag today)   Chloride  Yes stable balance balance   Acetate Yes stable balance Balance   Triglycerides No   Trending up SMOF Lipids (20 gm per RD) SMOF Lipids (20 gm per RD)   **Use corrected CA if alb < 4. Use ionized CA if patient is critically ill**  **Use caution if EGFR < 30 ml/min. Consider  decreasing electrolyte concentrations.**      Step 3: Assess Glucose Control  Sliding scale order: Medium  Basal insulin regimen: n/a     Assessment/Recommendation:    Continue same macro-nutrients TPN @ 42 ml/hr per RD  Micro-nutrients as above     Thank you,  Zenda Alpers, PharmD BCCCP

## 2022-07-02 NOTE — Treatment Plan (Cosign Needed)
4 eyes in 4 hours pressure injury assessment note:      Completed with:   Unit & Time admitted:              Bony Prominences: Check appropriate box; if wound is present enter wound assessment in LDA     Occiput:                 [x]WNL  [] Wound present  Face:                     [x]WNL  [] Wound present  Ears:                      [x]WNL  [] Wound present  Spine:                    [x]WNL  [] Wound present  Shoulders:             [x]WNL  [] Wound present  Elbows:                  [x]WNL  [] Wound present  Sacrum/coccyx:     [x]WNL  [] Wound present  Ischial Tuberosity:  [x]WNL  [] Wound present  Trochanter/Hip:      [x]WNL  [] Wound present  Knees:                   [x]WNL  [] Wound present  Ankles:                   [x]WNL  [] Wound present  Heels:                    [x]WNL  [] Wound present  Other pressure areas:  [] Wound location       Device related: [] Device name:         LDA completed if wound present: yes/no  Consult WOCN if necessary    Other skin related issues, ie tears, rash, etc, document in Integumentary flowsheet

## 2022-07-02 NOTE — Plan of Care (Signed)
Problem: Non-Violent Restraints Interdisciplinary Plan  Goal: Will be injury free during the use of non-violent restraints  Outcome: Progressing  Flowsheets (Taken 06/30/2022 0601 by Octaviano Batty, RN)  Will be injury free during the use of non-violent restraints:   Attempt all alternatives before use of restraints   Initiate least restrictive type of restraint that is effective   Provide and maintain safe environment   Notify family of initiation of restraints   Include patient/family/caregiver in decisions related to safety   Ensure safety devices are properly applied and maintained   Document observed patient actions according to protocol   Nurse to accompany patient off unit when on restraints   Remove restraints before the indicated maximum length of time when meets criteria for discontinuation   Document significant changes in patient condition   Provide debriefing as soon as possible and appropriate   Reassess need for continued restraints   Ensure that order for restraints has not expired     Problem: Compromised Sensory Perception  Goal: Sensory Perception Interventions  Outcome: Progressing  Flowsheets (Taken 07/02/2022 0800 by Constance Goltz, RN)  Sensory Perception Interventions: Offload heels, Pad bony prominences, Reposition q 2hrs/turn Clock, Q2 hour skin assessment under devices if present     Problem: Compromised Activity/Mobility  Goal: Activity/Mobility Interventions  Flowsheets (Taken 07/02/2022 0800 by Constance Goltz, RN)  Activity/Mobility Interventions: Pad bony prominences, TAP Seated positioning system when OOB, Promote PMP, Reposition q 2 hrs / turn clock, Offload heels     Problem: Compromised Nutrition  Goal: Nutrition Interventions  Outcome: Progressing  Flowsheets (Taken 07/02/2022 0800 by Constance Goltz, RN)  Nutrition Interventions: Discuss nutrition at Rounds, I&Os, Document % meal eaten, Daily weights     Problem: SCIP  Goal: SCIP measures are followed  Outcome:  Progressing  Flowsheets (Taken 06/29/2022 1023 by Luz Brazen, RN)  SCIP measures are followed:   Administer antibiotics as ordered   VTE Prevention: Administer anticoagulant(s) and/or apply anti-embolism stockings/devices as ordered     Problem: Inadequate Airway Clearance  Goal: Patent Airway maintained  Outcome: Progressing  Flowsheets (Taken 06/29/2022 1023 by Luz Brazen, RN)  Patent airway maintained:   Position patient for maximum ventilatory efficiency   Reposition patient every 2 hours and as needed unless able to self-reposition   Provide adequate fluid intake to liquefy secretions   Suction secretions as needed  Goal: Normal respiratory rate/effort achieved/maintained  Outcome: Progressing  Flowsheets (Taken 06/29/2022 1023 by Luz Brazen, RN)  Normal respiratory rate/effort achieved/maintained: Plan activities to conserve energy: plan rest periods     Problem: Infection Prevention  Goal: Free from infection  Outcome: Progressing  Flowsheets (Taken 06/29/2022 1023 by Luz Brazen, RN)  Free from infection:   Monitor/assess vital signs   Assess incision for evidence of healing   Monitor/assess lab values and report abnormal values   Encourage/assist patient to turn, cough and perform deep breathing every 2 hours   Monitor/assess output from surgical drain if present   Assess for signs and symptoms of infection     Problem: Impaired Mobility  Goal: Mobility/Activity is maintained at optimal level for patient  Outcome: Progressing  Flowsheets (Taken 06/29/2022 1023 by Luz Brazen, RN)  Mobility/activity is maintained at optimal level for patient:   Encourage independent activity per ability   Consult/collaborate with Physical Therapy and/or Occupational Therapy     Problem: Constipation  Goal: Fluid and electrolyte balance are achieved/maintained  Outcome: Progressing  Flowsheets (Taken 06/30/2022 0601 by Bebe Liter  Rimando, RN)  Fluid and electrolyte balance are  achieved/maintained:   Monitor/assess lab values and report abnormal values   Assess and reassess fluid and electrolyte status   Observe for cardiac arrhythmias   Monitor for muscle weakness     Problem: Safety  Goal: Patient will be free from injury during hospitalization  Outcome: Progressing  Flowsheets (Taken 06/29/2022 1023 by Luz Brazen, RN)  Patient will be free from injury during hospitalization:   Assess patient's risk for falls and implement fall prevention plan of care per policy   Use appropriate transfer methods   Include patient/ family/ care giver in decisions related to safety   Assess for patients risk for elopement and implement Elopement Risk Plan per policy   Provide alternative method of communication if needed (communication boards, writing)   Hourly rounding   Ensure appropriate safety devices are available at the bedside   Provide and maintain safe environment  Goal: Patient will be free from infection during hospitalization  Outcome: Progressing  Flowsheets (Taken 06/29/2022 1023 by Luz Brazen, RN)  Free from Infection during hospitalization:   Assess and monitor for signs and symptoms of infection   Monitor all insertion sites (i.e. indwelling lines, tubes, urinary catheters, and drains)   Encourage patient and family to use good hand hygiene technique   Monitor lab/diagnostic results     Problem: Pain  Goal: Pain at adequate level as identified by patient  Outcome: Progressing  Flowsheets (Taken 07/02/2022 0629 by Con Memos, RN)  Pain at adequate level as identified by patient: Identify patient comfort function goal     Problem: Side Effects from Pain Analgesia  Goal: Patient will experience minimal side effects of analgesic therapy  Outcome: Progressing  Flowsheets (Taken 07/02/2022 0629 by Con Memos, RN)  Patient will experience minimal side effects of analgesic therapy: Monitor/assess patient's respiratory status (RR depth, effort, breath sounds)      Problem: Hemodynamic Status: Cardiac  Goal: Stable vital signs and fluid balance  Outcome: Progressing  Flowsheets (Taken 06/30/2022 0601 by Octaviano Batty, RN)  Stable vital signs and fluid balance:   Assess signs and symptoms associated with cardiac rhythm changes   Monitor lab values     Problem: Renal Instability  Goal: Fluid and electrolyte balance are achieved/maintained  Outcome: Progressing  Flowsheets (Taken 06/30/2022 0601 by Octaviano Batty, RN)  Fluid and electrolyte balance are achieved/maintained:   Monitor/assess lab values and report abnormal values   Assess and reassess fluid and electrolyte status   Observe for cardiac arrhythmias   Monitor for muscle weakness   Follow fluid restrictions/IV/PO parameters     Problem: Patient Receiving Advanced Renal Therapies  Goal: Therapy access site remains intact  Outcome: Progressing  Flowsheets (Taken 06/30/2022 0601 by Octaviano Batty, RN)  Therapy access site remains intact:   Assess therapy access site   Change therapy access site dressing as needed     Problem: Fluid and Electrolyte Imbalance/ Endocrine  Goal: Fluid and electrolyte balance are achieved/maintained  Outcome: Progressing  Flowsheets (Taken 06/30/2022 0601 by Octaviano Batty, RN)  Fluid and electrolyte balance are achieved/maintained:   Monitor/assess lab values and report abnormal values   Assess and reassess fluid and electrolyte status   Observe for cardiac arrhythmias   Monitor for muscle weakness  Goal: Adequate hydration  Outcome: Progressing  Flowsheets (Taken 06/29/2022 1023 by Luz Brazen, RN)  Adequate hydration:   Assess mucus membranes, skin  color, turgor, perfusion and presence of edema   Assess for peripheral, sacral, periorbital and abdominal edema   Monitor and assess vital signs and perfusion     Problem: Diabetes: Glucose Imbalance  Goal: Blood glucose stable at established goal  Outcome: Progressing  Flowsheets  (Taken 06/30/2022 0601 by Octaviano Batty, RN)  Blood glucose stable at established goal:   Monitor lab values   Monitor intake and output.  Notify LIP if urine output is < 30 mL/hour.   Follow fluid restrictions/IV/PO parameters   Include patient/family in decisions related to nutrition/dietary selections   Assess for hypoglycemia /hyperglycemia   Monitor/assess vital signs   Coordinate medication administration with meals, as indicated   Ensure appropriate diet and assess tolerance   Ensure adequate hydration   Ensure appropriate consults are obtained (Nutrition, Diabetes Education, and Case Management)   Ensure patient/family has adequate teaching materials     Problem: Inadequate Gas Exchange  Goal: Patent Airway maintained  Outcome: Progressing  Flowsheets (Taken 06/29/2022 1023 by Luz Brazen, RN)  Patent airway maintained:   Position patient for maximum ventilatory efficiency   Reposition patient every 2 hours and as needed unless able to self-reposition   Provide adequate fluid intake to liquefy secretions   Suction secretions as needed  Goal: Adequate oxygenation and improved ventilation  Outcome: Progressing  Flowsheets (Taken 06/30/2022 0601 by Octaviano Batty, RN)  Adequate oxygenation and improved ventilation:   Assess lung sounds   Monitor SpO2 and treat as needed   Monitor and treat ETCO2   Provide mechanical and oxygen support to facilitate gas exchange   Position for maximum ventilatory efficiency     Problem: Artificial Airway  Goal: Endotracheal tube will be maintained  Outcome: Progressing

## 2022-07-02 NOTE — Plan of Care (Addendum)
Neuro- GCS of 4-4-6. With ongoing Precedex at 0.5 mcg.   Still on mitt restraints right and left to prevent removal of life sustaining equipment.  CV- maintaining MAP of >65. With systolic BP from 960'A to 150's. HR ranges from 60's t0 100's with occasional tachycardia.  Respi- On NC at 2L/min . No desaturation noted.   GI- on NPO. On  TPN at 42 cc/hr infusing well.   GU- anuric. On CVVHDF, zero net fluid balance; MCCS suggested -52mL/hr removal.   Skin- with generalized edema. Clean, dry, intact surgical wound in abdomen upon assessment. No discharges noted with no output on VAC.    Problem: Moderate/High Fall Risk Score >5  Goal: Patient will remain free of falls  Outcome: Progressing  Flowsheets (Taken 07/01/2022 2000)  Moderate Risk (6-13): MOD-Consider activation of bed alarm if appropriate  High (Greater than 13): HIGH-Bed alarm on at all times while patient in bed     Problem: Non-Violent Restraints Interdisciplinary Plan  Goal: Will be injury free during the use of non-violent restraints  Outcome: Progressing  Flowsheets (Taken 06/30/2022 0601 by Octaviano Batty, RN)  Will be injury free during the use of non-violent restraints:   Attempt all alternatives before use of restraints   Initiate least restrictive type of restraint that is effective   Provide and maintain safe environment   Notify family of initiation of restraints   Include patient/family/caregiver in decisions related to safety   Ensure safety devices are properly applied and maintained   Document observed patient actions according to protocol   Nurse to accompany patient off unit when on restraints   Remove restraints before the indicated maximum length of time when meets criteria for discontinuation   Document significant changes in patient condition   Provide debriefing as soon as possible and appropriate   Reassess need for continued restraints   Ensure that order for restraints has not expired     Problem: Compromised Sensory  Perception  Goal: Sensory Perception Interventions  Outcome: Progressing     Problem: Compromised Moisture  Goal: Moisture level Interventions  Outcome: Progressing     Problem: Compromised Activity/Mobility  Goal: Activity/Mobility Interventions  Outcome: Progressing     Problem: Compromised Nutrition  Goal: Nutrition Interventions  Outcome: Progressing  Flowsheets (Taken 07/01/2022 2000)  Nutrition Interventions: Discuss nutrition at Rounds, I&Os, Document % meal eaten, Daily weights     Problem: Compromised Friction/Shear  Goal: Friction and Shear Interventions  Outcome: Progressing     Problem: SCIP  Goal: SCIP measures are followed  Outcome: Progressing  Flowsheets (Taken 06/29/2022 1023 by Luz Brazen, RN)  SCIP measures are followed:   Administer antibiotics as ordered   VTE Prevention: Administer anticoagulant(s) and/or apply anti-embolism stockings/devices as ordered     Problem: Inadequate Airway Clearance  Goal: Patent Airway maintained  Outcome: Progressing  Flowsheets (Taken 06/29/2022 1023 by Luz Brazen, RN)  Patent airway maintained:   Position patient for maximum ventilatory efficiency   Reposition patient every 2 hours and as needed unless able to self-reposition   Provide adequate fluid intake to liquefy secretions   Suction secretions as needed  Goal: Normal respiratory rate/effort achieved/maintained  Outcome: Progressing  Flowsheets (Taken 06/29/2022 1023 by Luz Brazen, RN)  Normal respiratory rate/effort achieved/maintained: Plan activities to conserve energy: plan rest periods     Problem: Infection Prevention  Goal: Free from infection  Outcome: Progressing  Flowsheets (Taken 06/29/2022 1023 by Luz Brazen, RN)  Free from infection:   Monitor/assess  vital signs   Assess incision for evidence of healing   Monitor/assess lab values and report abnormal values   Encourage/assist patient to turn, cough and perform deep breathing every 2 hours   Monitor/assess output from surgical  drain if present   Assess for signs and symptoms of infection     Problem: Impaired Mobility  Goal: Mobility/Activity is maintained at optimal level for patient  Outcome: Progressing  Flowsheets (Taken 06/29/2022 1023 by Luz Brazen, RN)  Mobility/activity is maintained at optimal level for patient:   Encourage independent activity per ability   Consult/collaborate with Physical Therapy and/or Occupational Therapy     Problem: Constipation  Goal: Fluid and electrolyte balance are achieved/maintained  Outcome: Progressing  Flowsheets (Taken 06/30/2022 0601 by Octaviano Batty, RN)  Fluid and electrolyte balance are achieved/maintained:   Monitor/assess lab values and report abnormal values   Assess and reassess fluid and electrolyte status   Observe for cardiac arrhythmias   Monitor for muscle weakness  Goal: Elimination patterns are normal or improving  Outcome: Progressing     Problem: Safety  Goal: Patient will be free from injury during hospitalization  Outcome: Progressing  Flowsheets (Taken 06/29/2022 1023 by Luz Brazen, RN)  Patient will be free from injury during hospitalization:   Assess patient's risk for falls and implement fall prevention plan of care per policy   Use appropriate transfer methods   Include patient/ family/ care giver in decisions related to safety   Assess for patients risk for elopement and implement Elopement Risk Plan per policy   Provide alternative method of communication if needed (communication boards, writing)   Hourly rounding   Ensure appropriate safety devices are available at the bedside   Provide and maintain safe environment  Goal: Patient will be free from infection during hospitalization  Outcome: Progressing  Flowsheets (Taken 06/29/2022 1023 by Luz Brazen, RN)  Free from Infection during hospitalization:   Assess and monitor for signs and symptoms of infection   Monitor all insertion sites (i.e. indwelling lines, tubes, urinary catheters, and  drains)   Encourage patient and family to use good hand hygiene technique   Monitor lab/diagnostic results     Problem: Pain  Goal: Pain at adequate level as identified by patient  Outcome: Progressing  Flowsheets (Taken 07/01/2022 2127)  Pain at adequate level as identified by patient: Identify patient comfort function goal     Problem: Hemodynamic Status: Cardiac  Goal: Stable vital signs and fluid balance  Outcome: Progressing  Flowsheets (Taken 06/30/2022 0601 by Octaviano Batty, RN)  Stable vital signs and fluid balance:   Assess signs and symptoms associated with cardiac rhythm changes   Monitor lab values     Problem: Renal Instability  Goal: Fluid and electrolyte balance are achieved/maintained  Outcome: Progressing  Flowsheets (Taken 06/30/2022 0601 by Octaviano Batty, RN)  Fluid and electrolyte balance are achieved/maintained:   Monitor/assess lab values and report abnormal values   Assess and reassess fluid and electrolyte status   Observe for cardiac arrhythmias   Monitor for muscle weakness   Follow fluid restrictions/IV/PO parameters     Problem: Fluid and Electrolyte Imbalance/ Endocrine  Goal: Fluid and electrolyte balance are achieved/maintained  Outcome: Progressing  Flowsheets (Taken 06/30/2022 0601 by Octaviano Batty, RN)  Fluid and electrolyte balance are achieved/maintained:   Monitor/assess lab values and report abnormal values   Assess and reassess fluid and electrolyte status   Observe for cardiac arrhythmias  Monitor for muscle weakness  Goal: Adequate hydration  Outcome: Progressing  Flowsheets (Taken 06/29/2022 1023 by Luz Brazen, RN)  Adequate hydration:   Assess mucus membranes, skin color, turgor, perfusion and presence of edema   Assess for peripheral, sacral, periorbital and abdominal edema   Monitor and assess vital signs and perfusion     Problem: Diabetes: Glucose Imbalance  Goal: Blood glucose stable at established goal  Outcome:  Progressing  Flowsheets (Taken 06/30/2022 0601 by Octaviano Batty, RN)  Blood glucose stable at established goal:   Monitor lab values   Monitor intake and output.  Notify LIP if urine output is < 30 mL/hour.   Follow fluid restrictions/IV/PO parameters   Include patient/family in decisions related to nutrition/dietary selections   Assess for hypoglycemia /hyperglycemia   Monitor/assess vital signs   Coordinate medication administration with meals, as indicated   Ensure appropriate diet and assess tolerance   Ensure adequate hydration   Ensure appropriate consults are obtained (Nutrition, Diabetes Education, and Case Management)   Ensure patient/family has adequate teaching materials     Problem: Inadequate Gas Exchange  Goal: Patent Airway maintained  Outcome: Progressing  Flowsheets (Taken 06/29/2022 1023 by Luz Brazen, RN)  Patent airway maintained:   Position patient for maximum ventilatory efficiency   Reposition patient every 2 hours and as needed unless able to self-reposition   Provide adequate fluid intake to liquefy secretions   Suction secretions as needed     Problem: Diabetes: Glucose Imbalance  Goal: Blood glucose stable at established goal  Outcome: Progressing  Flowsheets (Taken 06/30/2022 0601 by Octaviano Batty, RN)  Blood glucose stable at established goal:   Monitor lab values   Monitor intake and output.  Notify LIP if urine output is < 30 mL/hour.   Follow fluid restrictions/IV/PO parameters   Include patient/family in decisions related to nutrition/dietary selections   Assess for hypoglycemia /hyperglycemia   Monitor/assess vital signs   Coordinate medication administration with meals, as indicated   Ensure appropriate diet and assess tolerance   Ensure adequate hydration   Ensure appropriate consults are obtained (Nutrition, Diabetes Education, and Case Management)   Ensure patient/family has adequate teaching materials     Problem: Inadequate Gas  Exchange  Goal: Patent Airway maintained  Outcome: Progressing  Flowsheets (Taken 06/29/2022 1023 by Luz Brazen, RN)  Patent airway maintained:   Position patient for maximum ventilatory efficiency   Reposition patient every 2 hours and as needed unless able to self-reposition   Provide adequate fluid intake to liquefy secretions   Suction secretions as needed

## 2022-07-02 NOTE — Progress Notes (Signed)
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 s/p bilateral radical nephrectomies and  cystectomy on 06/28/22  - complicated by small bowel enterotomy and blood loss - patient left in discontinuity   Hypotension post op - s/p levo  High grade urothelial bladder CA: s/p bilateral nephrectomies and cystectomy c/b mall bowel enterotomy and blood loss (~1L) on 06/28/22  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 (approx 1L) - hgb below goal - transfuse as needed  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  Secondary hyperPTH of renal origin    Recommendations:     Continue CRRT with UF today  Plan to transition to HD tomorrow  If filter clots tonight, do not restart  WBC count coming down - will plan for new permcath next week - has quinton in place as permacath stopped working     Case discussed with: friend, ICU RN    Delice Bison, MD, MD  [facility]  [phone] (office)  X [phone] ([facility] [employer]Link)      Subjective: confused, on CRRT, tolerating UF  SBP 150-160  Review of Systems:   Unable to assess, patient is confused     Physical Exam:     Vitals:    07/02/22 1200 07/02/22 1300 07/02/22 1400 07/02/22 1414   BP: 126/56 140/60 (!) 141/91    Pulse: 72 93 99 (!) 105   Resp: 21 21 (!) 23 (!) 24   Temp: (!) 96.3 F (35.7 C)      TempSrc: Axillary      SpO2: 93% 93% 93% 93%   Weight:       Height:           Intake and Output Summary (Last 24 hours) at Date Time    Intake/Output Summary (Last 24 hours) at 07/02/2022  1514  Last data filed at 07/02/2022 1500  Gross per 24 hour   Intake 2434.94 ml   Output 2569 ml   Net -134.06 ml       General: confused  Cardiovascular: regular rate and rhythm  Lungs: bilateral air entry  Abdomen: soft  Extremities: edema noted in her hands    Access: TDC    Meds:      Scheduled Meds: PRN Meds:    cefepime, 2 g, Intravenous, Q12H  heparin (porcine), 5,000 Units, Subcutaneous, Q12H SCH  insulin lispro, 1-8 Units, Subcutaneous, Q4H SCH  metroNIDAZOLE, 500 mg, Intravenous, Q8H          Continuous Infusions:   Adult Central Parenteral Nutrition (TPN) 2-in-1 42 mL/hr at 07/02/22 1500    And    SMOFLIPID (soy-MCT-olive-fish oil) Stopped (07/02/22 0547)    Adult Central Parenteral Nutrition (TPN) 2-in-1      And    SMOFLIPID (soy-MCT-olive-fish oil)      calcium GLUConate CRRT infusion 50 mL/hr (07/02/22 1500)    dexmedeTOMIDine 0.3 mcg/kg/hr (07/02/22 1500)    heparin 10,000 units in normal saline 20  mL syringe (CRRT) 500 Units/hr (07/02/22 0852)    prismaSATE 4/2.5 1,500 mL/hr at 07/02/22 1049    prismaSATE 4/2.5 1,000 mL/hr at 07/02/22 1156    benzocaine, 1 spray, TID PRN  dextrose, 15 g of glucose, PRN   Or  dextrose, 12.5 g, PRN   Or  dextrose, 12.5 g, PRN   Or  glucagon (rDNA), 1 mg, PRN  HYDROmorphone, 0.5 mg, Q2H PRN  magnesium sulfate, 1 g, PRN  naloxone, 0.2 mg, PRN  potassium chloride, 20 mEq, PRN  sodium chloride (PF), 10 mL, PRN  sodium phosphates 20 mmol in dextrose 5 % 250 mL IVPB, 20 mmol, PRN              Labs:     Recent Labs   Lab 07/02/22  0502 07/01/22  1120 07/01/22  0506   WBC 14.81* 18.74* 23.99*   Hemoglobin 7.7* 8.2* 7.9*   Hematocrit 22.8* 24.1* 23.9*   Platelet Count 102* 99* 107*     Recent Labs   Lab 07/02/22  1122 07/02/22  0502 07/02/22  0053 07/01/22  2230 07/01/22  1705   Sodium 138 136  --  136 137   Potassium 4.4 4.2  --  4.6 4.9   Chloride 105 106  --  107 107   CO2 26 25  --  22 24   BUN 14 14  --  15 14   Creatinine 1.2* 1.4*  --  1.6* 1.8*   Calcium 10.3* 8.3  --   8.8 9.5   Albumin 1.4* 1.5*  1.4*  --  1.4* 1.5*   Phosphorus 1.1* 1.6*  --  2.1* 2.6   Magnesium  --   --  2.5 2.5 2.5   Glucose 197* 211*  --  176* 132*   GFR 44.4* 36.9*  --  31.4* 27.3*           Signed by: Delice Bison, MD, MD

## 2022-07-02 NOTE — Progress Notes (Signed)
[facility]  Progress Note      Patient Name: Shelby Barron  MRN: [id number]  Room: V4098/J1914-78    Reason for Admission: Bladder cancer  ICU Day: 3d 17h      Overview   Shelby Barron is a 82 y.o. female who presents with history of PD, CVA, type 2 diabetes presented for elective cystectomy and completion nephrectomy.  OR complicated by small bowel enterotomy and hemorrhagic shock.    Interval Events   24 hour events: No major events overnight    Subjective   Hospital Course:  6/10: Admitted - OR for cystectomy. Left abdomen open in discontinuity   6/11:Remains sedated on pressors, required 1 unit packed RBC transfusion, CRRT started but issues with the dialysis catheter  6/12: Remains sedated on pressors, required 1 unit packed RBC transfusion, CRRT started but issues with the dialysis catheter.  The morning of June 12 she went to the OR for small bowel anastomosis, closure of mesenteric defect, closure of abdominal wall with wound VAC application.        Objective   Physical Examination   Physical Exam    Last vent settings if on vent:       Vitals Temp:  [96 F (35.6 C)-97.9 F (36.6 C)]   Heart Rate:  [59-120]   Resp Rate:  [16-31]   BP: (108-160)/(42-92)   Arterial Line BP: (116-173)/(41-64)   SpO2:  [96 %-100 %]   Height:  [157.5 cm (5\' 2" )]   Weight:  [97.6 kg (215 lb 2.7 oz)]   BMI (calculated):  [39.3]  // Temperature with 24 range    Input / Output:   Intake/Output Summary (Last 24 hours) at 07/02/2022 0959  Last data filed at 07/02/2022 0900  Gross per 24 hour   Intake 2056.47 ml   Output 2186 ml   Net -129.53 ml       Neuro:    Postextubation patient is alert, maintain eye contact, does not follow command most of the time, grunt seems to be trying to talk but and comprehensible, moving 4 limbs with equal power and generalized weakness    Lungs:   Good air entry bilaterally no wheezing    Cardiac:   Normal heart sound no added sound    Abdomen:    Slightly  distended, wound VAC in place, no rebound tenderness    Extremities:   Trace edema    Skin:     No rash        ICU Checklist  Sedation:   CAM-ICU: Positive or Negative for Delirium: Positive   CAM ICU:   Last Documented RASS: RASS Score: Drowsy   Currently ordered infusions:    Adult Central Parenteral Nutrition (TPN) 2-in-1 42 mL/hr at 07/02/22 0900    And    SMOFLIPID (soy-MCT-olive-fish oil) Stopped (07/02/22 0547)    calcium GLUConate CRRT infusion 50 mL/hr (07/02/22 0925)    dexmedeTOMIDine 0.2 mcg/kg/hr (07/02/22 0900)    heparin 10,000 units in normal saline 20 mL syringe (CRRT) 500 Units/hr (07/02/22 0852)    prismaSATE 4/2.5 1,500 mL/hr at 07/02/22 0429    prismaSATE 4/2.5 1,000 mL/hr at 07/02/22 0815    Reviewed: Yes   Mobility:   Current Mobility Level:   PMP Activity: Step 3 - Bed Mobility (07/02/2022  8:00 AM)    Current PT Order: No  Current OT Order: No Reviewed: Yes   Respiratory (n/a if blank):   Ventilator Time:  Last Recorded Vent Mode:    Reviewed: Yes   Gastrointenstinal  Last Bowel Movement:   Last BM Date: 06/03/22 Reviewed: Yes   CAUTI Prevention (n/a if blank):  Foley Day:        Reviewed: Yes   Blood Steam Infection Prevention (n/a if blank):   Permacath Catheter - Tunneled 06/01/22 Internal Jugular Right  Peripheral Arterial Line 06/28/22 Right Radial  Temporary Catheter with Pigtail 06/30/22 Temporary Catheter Right Reviewed: Yes   DVT Chemoprophylaxis (none if blank):   heparin 10,000 units in normal saline 20 mL syringe (CRRT)   heparin (porcine) injection 5,000 Units  Reviewed: Yes       Assessment   Shelby Barron is a 82 y.o. female who presents with history of ESRD, CVA, type 2 diabetes presented for elective cystectomy and completion nephrectomy.  OR complicated by small bowel enterotomy and hemorrhagic shock.       Patient has BMI=Body mass index is 39.35 kg/m.  Diagnosis: No additional diagnosis based on BMI criteria          Recent Labs     07/02/22  0502 07/01/22  1120  07/01/22  0506 06/30/22  2310 06/30/22  1646 06/30/22  1226 06/30/22  0544 06/29/22  2313 06/29/22  1841 06/29/22  1135   Platelet Count 102* 99* 107* 105* 113* 100* 97* 122* 140* 138*     Diagnosis: Mild Thrombocytopenia             Recent Labs     07/02/22  0502 07/01/22  2230 07/01/22  1705 07/01/22  1120 07/01/22  0506 06/30/22  2310 06/30/22  1646 06/30/22  1226 06/30/22  0544 06/29/22  2313 06/29/22  1841 06/29/22  1135   Potassium 4.2 4.6 4.9 4.8 4.8 4.9 5.6* 5.4* 5.0 4.4 4.7 3.8     Diagnosis: No additional diagnosis           Recent Labs   Lab 07/02/22  0502 07/01/22  1120 07/01/22  0506   Hemoglobin 7.7* 8.2* 7.9*   Hematocrit 22.8* 24.1* 23.9*   MCV 90.5 88.9 89.5   WBC 14.81* 18.74* 23.99*   Platelet Count 102* 99* 107*     Recent Labs   Lab 06/02/22  0514   Iron 62   TIBC 145*   Iron Saturation 43     Anemia Diagnosis: No new diagnosis   Malnutrition Documentation    Moderate Malnutrition related to inadequate nutritional intake in the setting of acute illness as evidenced by <50% EER > 5 days, mild muscle losses (temporalis, deltoid) - new                    Plan   Plan   NEUROLOGICAL:   #History of CVA  #Encephalopathy/delirium  Hold home plavix  Delirium preventing strategy  Avoid benzodiazepine  Pain control, currently using Dilaudid as needed  Remains delirious but improved. Following commands.  Will say her name.     CARDIOVASCULAR:   #Mixed shock - hemorrhagic vs septic: Resolved  Had EBL approximately 1 L in the OR on 6/10 also with small bowel enterotomy  Transfused 3 units PRBC in OR. Received another unit on 6/11 and 6/12  MAP greater than 65  Had TTE as part of preop cardiac clearance  Mild aortic stenosis (AVA 1.7), grade 1 diastolic dysfunction, normal LV/RV systolic function  Normotensive off of vasopressors        PULMONARY:   #Acute hypoxemic respiratory failure  #  COPD  Extubated and doing well.  Comfortable on 2 L nasal cannula     RENAL:   #ESRD  CRRT running have increased UF to net  -25 mL/h  Serial labs per CRRT protocol  Nephrology following     GASTROINTESTINAL:  #S/P Cystectomy and completion nephrectomy  6/10 underwent cystectomy and nephrectomy complicated by small bowel enterotomy and hemorrhagic shock  Went to the OR on June 12 for small bowel anastomosis, closure of mesenteric defect, closure of abdominal wall with wound VAC application  -No bowel movements or flatus overnight.  Continue TPN while awaiting return of bowel function     INFECTIOUS DISEASE:   Cefepime/Flagyl per ID     HEMATOLOGY/ONCOLOGY:   #Renal cell carcinoma  #Urothelial carcinoma  #Thrombocytopenia  -Plts 102 this morning.  Question related to Zosyn, sepsis?  Will follow  - VTE ppx: SCDs, subcutaneous heparin     ENDOCRONOLOGY/RHEUMATOLOGY:   #Diabetes Mellitus type 2  ISS, goal less than 180     Discussed with the patient's daughter at bedside    This patient is critically ill with life-threatening condition(s) and a high probability of sudden clinically significant deterioration due to the condition(s) noted in the assessment and plan, which requires the highest level of physician/advance practice provider preparedness to intervene urgently. Full attention to the direct care of this patient was provided for the period of time noted below. Any critical care time performed today is exclusive of teaching and billable procedures and not overlapping with any other physicians or advance practice providers.    I have personally assessed the patient and based my assessment and medical decision-making on a review of the patient's history and 24-hour interval events along with medical records, physical examination, vital signs, analysis of recent laboratory results, evaluation of radiology images, monitoring data for potential decompensation, and additional findings found in detail within ICU team notes. The findings and plan of care was discussed with the care team.    Total critical care time: 34 minutes during this  encounter.    Velia Meyer, MD   07/02/2022 9:59 AM

## 2022-07-02 NOTE — Progress Notes (Signed)
[facility] PROGRESS NOTE      Date of Note: 07/02/2022   Patient Name: Shelby Barron     Date of Birth:  1940-02-13     MRN:               [id number]     PCP:               Patsy Lager, MD     I am available on epic chart and [facility] link extension [phone] Monday - Friday 8:30-16:30. If urology consultation is needed outside these hours please contact [facility] at [phone].      ASSESSMENT/  PLAN     82 yo female with hx of renal failure, renal tumor, bladder cancer. Converted to HD May 2024.     -medical management per primary, nephrology, general surgery, ID    -diet per general surgery   -daily labs   -abx per ID  -multimodal pain control  -no additional recs to add from GU standpoint     I will discuss/discussed the plan of care with the following services:  Dr. Modesto Charon, Urology who also evaluated patient   daughter at bedside     Addendum:  patient seen and examined.  Agree with above.    I spent a total of 20 minutes involved in this patient's care    Sheldon Silvan, PA  07/02/2022, 11:21 AM    SUBJECTIVE     Interval History:     6/14: off vasopressors. Continues on CRRT.     6/13: extubated. Continues on CRRT.     6/12: back to OR with GS for closure of abd wall/mesenteric defect and small bowel anastomosis. Undergoing CRRT.     6/11: in ICU requiring norepinephrine, intubated and sedated      6/10: Radical cystectomy, bilateral  open radical nephrectomies, general surgery consulted intra-op for enterotomy and small bowel resection, patient left in discontinuity as she developed acute blood loss requiring pressors. EBL: 1L. Received 3 units      Patient Active Problem List    Diagnosis Date Noted    Anemia 06/30/2022    COPD (chronic obstructive pulmonary disease) 06/30/2022    Bladder cancer 06/28/2022    Malignant neoplasm of urinary bladder, unspecified site 06/25/2022    Malignant tumor of kidney, left 06/25/2022    GERD (gastroesophageal reflux disease) 06/25/2022    Leukocytosis 06/25/2022     Chronic diastolic congestive heart failure 06/23/2022    Aortic atherosclerosis 06/23/2022    Peritonitis 05/26/2022    ESRD (end stage renal disease) 05/26/2022    Hamstring injury 05/10/2013    Benign essential hypertension 02/10/2013    Left renal mass 11/28/2012    Type 2 diabetes mellitus with kidney complication, with long-term current use of insulin 07/31/2010    Diabetes mellitus type II, uncontrolled 07/31/2010    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 No retractions or increased work of breathing.   Abdomen: non-distended, incisions CDI, wound vac in situ    LABS & IMAGING     CBC  Recent Labs     07/02/22  0502 07/01/22  1120 07/01/22  0506   WBC 14.81* 18.74* 23.99*   RBC 2.52* 2.71* 2.67*   Hematocrit 22.8* 24.1* 23.9*   MCV 90.5 88.9 89.5   MCH 30.6 30.3 29.6   MCHC 33.8 34.0 33.1   RDW 15 15 16*   MPV 10.9 11.1 11.3       BMP  Recent Labs     07/02/22  0502 07/01/22  2230 07/01/22  1705   CO2 25 22 24    Anion Gap 5.0 7.0 6.0   BUN 14 15 14    Creatinine 1.4* 1.6* 1.8*       INR  Lab Results   Component Value Date/Time    INR 1.1 07/01/2022 11:20 AM    INR 1.1 06/01/2022 03:15 AM         IMAGING:  XR Chest AP Portable   Final Result         1. Lines and tubes in adequate position. No pneumothorax.   2. Trace left pleural effusion and bibasilar atelectasis.      Jasmine December D'Heureux, MD   06/30/2022 4:53 PM      XR Chest AP Portable   Final Result      Catheters as described.       Pankaj Dominica, MD   06/30/2022 2:07 PM      XR Chest AP Portable   Final Result    Right mainstem bronchial intubation. The tube should be   withdrawn about 4 cm.      Dr. Tildon Husky is aware.      Wynema Birch, MD   06/28/2022 4:56 PM      XR OR MISCOUNT   Final Result    No unexpected foreign body.      These critical results were discussed with and acknowledged by Angie R.,   the circulating nurse, who will communicate the findings to Everardo All,   MD on 06/28/2022 3:02 PM.         Bosie Helper, MD   06/28/2022 3:03 PM             Malnutrition Documentation    Moderate Malnutrition related to inadequate nutritional intake in the setting of acute illness as evidenced by <50% EER > 5 days, mild muscle losses (temporalis, deltoid) - new

## 2022-07-02 NOTE — Progress Notes (Signed)
[facility] Diseases [facility] Medicine  Progress Note    07/02/2022   Shelby Barron CSN:[id number],MRN:[id number] is a 82 y.o. female,       Assessment:     Acute respiratory failure improving  Encephalopathy  History of bladder cancer and renal failure  Status post radical cystectomy and lysis of adhesion  S/p bowel resection-awaiting return of bowel function  Risk of aspiration  Leukocytosis improving  Thrombocytopenia improving    Plan:     Continue cefepime and Flagyl  Follow-up chest x-ray  Continue probiotics  Monitor electrolytes and renal functions closely  Monitor clinically          Discussed with patient's friend in detail  Discussed with nursing staff     ROS:     General:  no fever, awake, confused, receiving CRRT, family at bedside  Respiratory: no cough, shortness of breath, or wheezing    Gastrointestinal: no diarrhea  Genito-Urinary: no hematuria  Musculoskeletal: no edema  Neurological: c/o generalized weakness   Dermatological: No rash    Physical Examination:     Blood pressure 126/58, pulse 70, temperature (!) 96 F (35.6 C), temperature source Axillary, resp. rate 19, height 1.575 m (5\' 2" ), weight 97.6 kg (215 lb 2.7 oz), SpO2 100%.    General Appearance: Delirious, awake  HEENT: Pupils are equal, round, and reactive to light.   Lungs:  Clear to auscultation  Heart:  Regular rate and rhythm  Chest: Symmetric chest wall expansion.   Abdomen: soft, no hepatosplenomegaly, no bowel sounds  Neurological: awake  Extremities: No edema    Laboratory And Diagnostic Studies:     Recent Labs     07/02/22  0502 07/01/22  1120   WBC 14.81* 18.74*   Hemoglobin 7.7* 8.2*   Hematocrit 22.8* 24.1*   Platelet Count 102* 99*     Recent Labs     07/02/22  0502 07/01/22  2230   Sodium 136 136   Potassium 4.2 4.6   Chloride 106 107   CO2 25 22   BUN 14 15   Creatinine 1.4* 1.6*   Glucose 211* 176*   Calcium 8.3 8.8     Recent Labs     07/02/22  0502 07/01/22  2230 07/01/22  1120 07/01/22  0506   AST (SGOT) 23  --    --  25   ALT 18  --   --  18   Alkaline Phosphatase 64  --   --  68   Protein, Total 4.0*  --   --  4.0*   Albumin 1.5*  1.4* 1.4*   < > 1.4*  1.4*    < > = values in this interval not displayed.       Current Meds:      Scheduled Meds: PRN Meds:    cefepime, 2 g, Intravenous, Q12H  heparin (porcine), 5,000 Units, Subcutaneous, Q12H SCH  insulin lispro, 1-8 Units, Subcutaneous, Q4H SCH  metroNIDAZOLE, 500 mg, Intravenous, Q8H        Continuous Infusions:   Adult Central Parenteral Nutrition (TPN) 2-in-1 42 mL/hr at 07/02/22 0900    And    SMOFLIPID (soy-MCT-olive-fish oil) Stopped (07/02/22 0547)    calcium GLUConate CRRT infusion 50 mL/hr (07/02/22 0925)    dexmedeTOMIDine 0.2 mcg/kg/hr (07/02/22 0900)    heparin 10,000 units in normal saline 20 mL syringe (CRRT) 500 Units/hr (07/02/22 0852)    prismaSATE 4/2.5 1,500 mL/hr at 07/02/22 0429    prismaSATE 4/2.5 1,000 mL/hr  at 07/02/22 0815    benzocaine, 1 spray, TID PRN  dextrose, 15 g of glucose, PRN   Or  dextrose, 12.5 g, PRN   Or  dextrose, 12.5 g, PRN   Or  glucagon (rDNA), 1 mg, PRN  HYDROmorphone, 0.5 mg, Q2H PRN  magnesium sulfate, 1 g, PRN  potassium chloride, 20 mEq, PRN  sodium chloride (PF), 10 mL, PRN  sodium phosphates 20 mmol in dextrose 5 % 250 mL IVPB, 20 mmol, PRN          Kanaya Gunnarson A. Janalyn Rouse, M.D.  07/02/2022  9:54 AM

## 2022-07-02 NOTE — Progress Notes (Signed)
LOS # 4      Summary of Discharge Plan:Pt to discharge Home with [facility] services vs TBD ( recs pending)        Identified Possible Discharge Barriers:Medical stability ( pt remains delirious but improved)        CM Interventions and Outcome: CM reviewed pt's chart        Discussed above Discharge Plan with (patient, family, Care Team, others): Care Team        Case Management will continue to follow for [facility] needs that may arise.       Marylou Mccoy RN BSN CM I  [facility]  [phone]

## 2022-07-02 NOTE — Plan of Care (Signed)
Neuro: Patient is confused, follows commands. Bilateral soft mitt restraints to prevent removal of life sustaining devices.   CV: Sinus rhythm HR:70s-90s, BP: 100s-130s systolic   PULM: room air, diminished lung sounds   GI: TPN at 70ml/hr  ENDO:q4 blood glucose check   GU: CRRT, anuric   INTEG: Intact  Problem: Moderate/High Fall Risk Score >5  Goal: Patient will remain free of falls  Flowsheets (Taken 07/02/2022 0800)  High (Greater than 13):   HIGH-Visual cue at entrance to patient's room   HIGH-Bed alarm on at all times while patient in bed   HIGH-Initiate use of floor mats as appropriate     Problem: Non-Violent Restraints Interdisciplinary Plan  Goal: Will be injury free during the use of non-violent restraints  Flowsheets (Taken 06/30/2022 0601 by Octaviano Batty, RN)  Will be injury free during the use of non-violent restraints:   Attempt all alternatives before use of restraints   Initiate least restrictive type of restraint that is effective   Provide and maintain safe environment   Notify family of initiation of restraints   Include patient/family/caregiver in decisions related to safety   Ensure safety devices are properly applied and maintained   Document observed patient actions according to protocol   Nurse to accompany patient off unit when on restraints   Remove restraints before the indicated maximum length of time when meets criteria for discontinuation   Document significant changes in patient condition   Provide debriefing as soon as possible and appropriate   Reassess need for continued restraints   Ensure that order for restraints has not expired     Problem: SCIP  Goal: SCIP measures are followed  Flowsheets (Taken 06/29/2022 1023 by Luz Brazen, RN)  SCIP measures are followed:   Administer antibiotics as ordered   VTE Prevention: Administer anticoagulant(s) and/or apply anti-embolism stockings/devices as ordered     Problem: Infection Prevention  Goal: Free from  infection  Flowsheets (Taken 06/29/2022 1023 by Luz Brazen, RN)  Free from infection:   Monitor/assess vital signs   Assess incision for evidence of healing   Monitor/assess lab values and report abnormal values   Encourage/assist patient to turn, cough and perform deep breathing every 2 hours   Monitor/assess output from surgical drain if present   Assess for signs and symptoms of infection     Problem: Constipation  Goal: Fluid and electrolyte balance are achieved/maintained  Flowsheets (Taken 06/30/2022 0601 by Octaviano Batty, RN)  Fluid and electrolyte balance are achieved/maintained:   Monitor/assess lab values and report abnormal values   Assess and reassess fluid and electrolyte status   Observe for cardiac arrhythmias   Monitor for muscle weakness     Problem: Safety  Goal: Patient will be free from injury during hospitalization  Flowsheets (Taken 06/29/2022 1023 by Luz Brazen, RN)  Patient will be free from injury during hospitalization:   Assess patient's risk for falls and implement fall prevention plan of care per policy   Use appropriate transfer methods   Include patient/ family/ care giver in decisions related to safety   Assess for patients risk for elopement and implement Elopement Risk Plan per policy   Provide alternative method of communication if needed (communication boards, writing)   Hourly rounding   Ensure appropriate safety devices are available at the bedside   Provide and maintain safe environment  Goal: Patient will be free from infection during hospitalization  Flowsheets (Taken 06/29/2022 1023 by Luz Brazen, RN)  Free from Infection during hospitalization:   Assess and monitor for signs and symptoms of infection   Monitor all insertion sites (i.e. indwelling lines, tubes, urinary catheters, and drains)   Encourage patient and family to use good hand hygiene technique   Monitor lab/diagnostic results     Problem: Pain  Goal: Pain at adequate level as  identified by patient  Flowsheets (Taken 07/02/2022 0629 by Con Memos, RN)  Pain at adequate level as identified by patient: Identify patient comfort function goal     Problem: Hemodynamic Status: Cardiac  Goal: Stable vital signs and fluid balance  Flowsheets (Taken 06/30/2022 0601 by Octaviano Batty, RN)  Stable vital signs and fluid balance:   Assess signs and symptoms associated with cardiac rhythm changes   Monitor lab values     Problem: Renal Instability  Goal: Fluid and electrolyte balance are achieved/maintained  Flowsheets (Taken 06/30/2022 0601 by Octaviano Batty, RN)  Fluid and electrolyte balance are achieved/maintained:   Monitor/assess lab values and report abnormal values   Assess and reassess fluid and electrolyte status   Observe for cardiac arrhythmias   Monitor for muscle weakness   Follow fluid restrictions/IV/PO parameters     Problem: Patient Receiving Advanced Renal Therapies  Goal: Therapy access site remains intact  Flowsheets (Taken 06/30/2022 0601 by Octaviano Batty, RN)  Therapy access site remains intact:   Assess therapy access site   Change therapy access site dressing as needed     Problem: Fluid and Electrolyte Imbalance/ Endocrine  Goal: Fluid and electrolyte balance are achieved/maintained  Flowsheets (Taken 06/30/2022 0601 by Octaviano Batty, RN)  Fluid and electrolyte balance are achieved/maintained:   Monitor/assess lab values and report abnormal values   Assess and reassess fluid and electrolyte status   Observe for cardiac arrhythmias   Monitor for muscle weakness  Goal: Adequate hydration  Flowsheets (Taken 06/29/2022 1023 by Luz Brazen, RN)  Adequate hydration:   Assess mucus membranes, skin color, turgor, perfusion and presence of edema   Assess for peripheral, sacral, periorbital and abdominal edema   Monitor and assess vital signs and perfusion     Problem: Diabetes: Glucose Imbalance  Goal: Blood  glucose stable at established goal  Flowsheets (Taken 06/30/2022 0601 by Octaviano Batty, RN)  Blood glucose stable at established goal:   Monitor lab values   Monitor intake and output.  Notify LIP if urine output is < 30 mL/hour.   Follow fluid restrictions/IV/PO parameters   Include patient/family in decisions related to nutrition/dietary selections   Assess for hypoglycemia /hyperglycemia   Monitor/assess vital signs   Coordinate medication administration with meals, as indicated   Ensure appropriate diet and assess tolerance   Ensure adequate hydration   Ensure appropriate consults are obtained (Nutrition, Diabetes Education, and Case Management)   Ensure patient/family has adequate teaching materials

## 2022-07-03 LAB — MAGNESIUM
Magnesium: 2.4 mg/dL (ref 1.6–2.6)
Magnesium: 1.9 mg/dL (ref 1.6–2.6)

## 2022-07-03 LAB — RENAL FUNCTION PANEL
Albumin: 1.4 g/dL — ABNORMAL LOW (ref 3.5–5.0)
Anion Gap: 7 (ref 5.0–15.0)
BUN: 23 mg/dL — ABNORMAL HIGH (ref 7–21)
CO2: 23 mEq/L (ref 17–29)
Calcium: 10.4 mg/dL — ABNORMAL HIGH (ref 7.9–10.2)
Chloride: 106 mEq/L (ref 99–111)
Creatinine: 1.9 mg/dL — ABNORMAL HIGH (ref 0.4–1.0)
GFR: 25.6 mL/min/{1.73_m2} — ABNORMAL LOW (ref >=60.0)
Glucose: 238 mg/dL — ABNORMAL HIGH (ref 70–100)
Phosphorus: 1.3 mg/dL — ABNORMAL LOW (ref 2.3–4.7)
Potassium: 4.3 mEq/L (ref 3.5–5.3)
Sodium: 136 mEq/L (ref 135–145)

## 2022-07-03 LAB — WHOLE BLOOD GLUCOSE POCT
Whole Blood Glucose POCT: 178 mg/dL — ABNORMAL HIGH (ref 70–100)
Whole Blood Glucose POCT: 183 mg/dL — ABNORMAL HIGH (ref 70–100)
Whole Blood Glucose POCT: 215 mg/dL — ABNORMAL HIGH (ref 70–100)
Whole Blood Glucose POCT: 235 mg/dL — ABNORMAL HIGH (ref 70–100)
Whole Blood Glucose POCT: 242 mg/dL — ABNORMAL HIGH (ref 70–100)
Whole Blood Glucose POCT: 248 mg/dL — ABNORMAL HIGH (ref 70–100)
Whole Blood Glucose POCT: 252 mg/dL — ABNORMAL HIGH (ref 70–100)
Whole Blood Glucose POCT: 273 mg/dL — ABNORMAL HIGH (ref 70–100)

## 2022-07-03 LAB — CALCIUM, IONIZED
Calcium, Ionized: 2.82 mEq/L — ABNORMAL HIGH (ref 2.23–2.56)
Calcium, Ionized: 2.38 mEq/L (ref 2.23–2.56)

## 2022-07-03 LAB — CROSSMATCH PRBC, 1 UNIT
Expiration Date: 202407102359
Product Status: TRANSFUSED
Unit Blood Type: O POS

## 2022-07-03 LAB — HEPATITIS B (HBV) SURFACE ANTIGEN WITH REFLEX TO CONFIRMATION: Hepatitis B Surface Antigen: NONREACTIVE

## 2022-07-03 LAB — BASIC METABOLIC PANEL
Anion Gap: 9 (ref 5.0–15.0)
BUN: 15 mg/dL (ref 7–21)
CO2: 25 mEq/L (ref 17–29)
Calcium: 8.3 mg/dL (ref 7.9–10.2)
Chloride: 103 mEq/L (ref 99–111)
Creatinine: 1.4 mg/dL — ABNORMAL HIGH (ref 0.4–1.0)
GFR: 36.9 mL/min/{1.73_m2} — ABNORMAL LOW (ref >=60.0)
Glucose: 269 mg/dL — ABNORMAL HIGH (ref 70–100)
Potassium: 3.5 mEq/L (ref 3.5–5.3)
Sodium: 137 mEq/L (ref 135–145)

## 2022-07-03 LAB — CBC
Absolute nRBC: 0.05 10*3/uL — ABNORMAL HIGH (ref <=0.00)
Hematocrit: 22.7 % — ABNORMAL LOW (ref 34.7–43.7)
Hemoglobin: 7.6 g/dL — ABNORMAL LOW (ref 11.4–14.8)
MCH: 30.5 pg (ref 25.1–33.5)
MCHC: 33.5 g/dL (ref 31.5–35.8)
MCV: 91.2 fL (ref 78.0–96.0)
MPV: 10.5 fL (ref 8.9–12.5)
Platelet Count: 129 10*3/uL — ABNORMAL LOW (ref 142–346)
RBC: 2.49 10*6/uL — ABNORMAL LOW (ref 3.90–5.10)
RDW: 15 % (ref 11–15)
WBC: 13.59 10*3/uL — ABNORMAL HIGH (ref 3.10–9.50)
nRBC %: 0.4 /100 WBC — ABNORMAL HIGH (ref <=0.0)

## 2022-07-03 LAB — HEPATIC FUNCTION PANEL (LFT)
ALT: 16 U/L (ref 0–55)
AST (SGOT): 16 U/L (ref 5–41)
Albumin/Globulin Ratio: 0.4 — ABNORMAL LOW (ref 0.9–2.2)
Albumin: 1.4 g/dL — ABNORMAL LOW (ref 3.5–5.0)
Alkaline Phosphatase: 61 U/L (ref 37–117)
Bilirubin Direct: 0.1 mg/dL (ref 0.0–0.5)
Bilirubin Indirect: 0.1 mg/dL — ABNORMAL LOW (ref 0.2–1.0)
Bilirubin, Total: 0.2 mg/dL (ref 0.2–1.2)
Globulin: 3.7 g/dL — ABNORMAL HIGH (ref 2.0–3.6)
Protein, Total: 5.1 g/dL — ABNORMAL LOW (ref 6.0–8.3)

## 2022-07-03 LAB — PHOSPHORUS: Phosphorus: 1.6 mg/dL — ABNORMAL LOW (ref 2.3–4.7)

## 2022-07-03 MED ORDER — STERILE WATER FOR INJECTION IJ/IV SOLN (WRAP)
1.0000 g | INTRAMUSCULAR | Status: DC
Start: 2022-07-03 — End: 2022-07-05
  Administered 2022-07-03 – 2022-07-05 (×3): 1 g via INTRAVENOUS
  Filled 2022-07-03 (×3): qty 1000

## 2022-07-03 MED ORDER — THIAMINE (VITAMIN B1) 100 MG PO TABS (WRAP)
100.0000 mg | ORAL_TABLET | Freq: Every day | ORAL | Status: DC
Start: 2022-07-03 — End: 2022-07-04

## 2022-07-03 MED ORDER — LABETALOL HCL 5 MG/ML IV SOLN (WRAP)
10.0000 mg | INTRAVENOUS | Status: DC | PRN
Start: 2022-07-03 — End: 2022-07-13
  Administered 2022-07-03 – 2022-07-05 (×8): 10 mg via INTRAVENOUS
  Filled 2022-07-03 (×7): qty 4

## 2022-07-03 MED ORDER — INSULIN LISPRO 100 UNIT/ML SOLN (WRAP)
2.0000 [IU] | Status: DC
Start: 2022-07-03 — End: 2022-07-19
  Administered 2022-07-03 (×3): 4 [IU] via SUBCUTANEOUS
  Administered 2022-07-03: 2 [IU] via SUBCUTANEOUS
  Administered 2022-07-03 (×2): 6 [IU] via SUBCUTANEOUS
  Administered 2022-07-04 (×2): 2 [IU] via SUBCUTANEOUS
  Administered 2022-07-04: 4 [IU] via SUBCUTANEOUS
  Administered 2022-07-05: 2 [IU] via SUBCUTANEOUS
  Administered 2022-07-05 – 2022-07-06 (×4): 4 [IU] via SUBCUTANEOUS
  Administered 2022-07-06: 2 [IU] via SUBCUTANEOUS
  Administered 2022-07-06: 4 [IU] via SUBCUTANEOUS
  Administered 2022-07-06: 2 [IU] via SUBCUTANEOUS
  Administered 2022-07-07: 4 [IU] via SUBCUTANEOUS
  Administered 2022-07-07: 6 [IU] via SUBCUTANEOUS
  Administered 2022-07-07: 4 [IU] via SUBCUTANEOUS
  Administered 2022-07-07: 6 [IU] via SUBCUTANEOUS
  Administered 2022-07-07 (×2): 4 [IU] via SUBCUTANEOUS
  Administered 2022-07-08 – 2022-07-18 (×7): 2 [IU] via SUBCUTANEOUS
  Filled 2022-07-03: qty 18
  Filled 2022-07-03 (×3): qty 6
  Filled 2022-07-03: qty 9
  Filled 2022-07-03 (×3): qty 6
  Filled 2022-07-03 (×3): qty 12
  Filled 2022-07-03: qty 6
  Filled 2022-07-03: qty 18
  Filled 2022-07-03 (×2): qty 12
  Filled 2022-07-03: qty 18
  Filled 2022-07-03: qty 12
  Filled 2022-07-03: qty 3
  Filled 2022-07-03: qty 12
  Filled 2022-07-03: qty 3
  Filled 2022-07-03 (×2): qty 12
  Filled 2022-07-03: qty 6
  Filled 2022-07-03 (×2): qty 12
  Filled 2022-07-03 (×2): qty 6
  Filled 2022-07-03: qty 12
  Filled 2022-07-03 (×2): qty 6
  Filled 2022-07-03: qty 12
  Filled 2022-07-03: qty 18

## 2022-07-03 MED ORDER — SODIUM CHLORIDE 0.9 % IV MBP
0.0000 mg/h | INTRAVENOUS | Status: DC
Start: 2022-07-03 — End: 2022-07-04
  Administered 2022-07-03 – 2022-07-04 (×4): 5 mg/h via INTRAVENOUS
  Filled 2022-07-03 (×4): qty 10

## 2022-07-03 MED ORDER — NOREPINEPHRINE-DEXTROSE 8-5 MG/250ML-% IV SOLN (IHS)
0.0000 ug/min | INTRAVENOUS | Status: DC
Start: 2022-07-03 — End: 2022-07-03
  Administered 2022-07-03: 2 ug/min via INTRAVENOUS
  Filled 2022-07-03: qty 250

## 2022-07-03 MED ORDER — SODIUM CHLORIDE 0.9 % IV BOLUS
250.0000 mL | INTRAVENOUS | Status: AC | PRN
Start: 2022-07-03 — End: 2022-07-03

## 2022-07-03 MED ORDER — SODIUM CHLORIDE 0.9 % IV BOLUS
100.0000 mL | INTRAVENOUS | Status: AC | PRN
Start: 2022-07-03 — End: 2022-07-03

## 2022-07-03 NOTE — Progress Notes (Signed)
GENERAL SURGERY  PROGRESS NOTE    Date Time: 07/03/22 3:50 PM  Patient Name: Shelby Barron: 4    ASSESSMENT:     Patient Active Problem List   Diagnosis    Type 2 diabetes mellitus with kidney complication, with long-term current use of insulin    Diabetes mellitus type II, uncontrolled    Neuropathy of hand    Left renal mass    Hamstring injury    Peritonitis    ESRD (end stage renal disease)    Chronic diastolic congestive heart failure    Aortic atherosclerosis    Malignant neoplasm of urinary bladder, unspecified site    Malignant tumor of kidney, left    Benign essential hypertension    GERD (gastroesophageal reflux disease)    Leukocytosis    Bladder cancer    Anemia    COPD (chronic obstructive pulmonary disease)       Shelby Barron is a 82 y.o. female with bladder cancer and renal failure admitted for cystectomy with bilateral nephrectomy complicated by enterotomy requiring small bowel resection in discontinuity and negative pressure temporary abdominal closure given pressure support and need for resuscitation. Returned to the OR 6/12 for re-anastomosis and abdominal closure.  Wound VAC changed at bedside.    PLAN:   - NPO, mIVF, once no longer on intermittent pressors will consider advancing diet  -Wound care ostomy nursing consultation  -SLP consultation  - Pain control   - Transfuse PRN for anemia  -Wound VAC  - Abx: zosyn per ID  - Remainder of care per primary team     Attending Note:    I, Marlaine Hind, MD, have personally seen and examined this patient and have participated in their care. I agree with all clinical information, including the physical exam, patient history, and planning as documented by the physician assistant/resident. All notes, labs, and imaging were reviewed.  The patient was seen on the date the note was written      Marlaine Hind, MD  [facility]  [phone]     SUBJECTIVE:   NAOE    Pain well controlled. No emesis. + bowel movement. On  pressors for CRRT.     Objective:   Current Vitals:   Vitals:    07/03/22 1540   BP: 180/77   Pulse: 85   Resp: (!) 38   Temp:    SpO2: 96%       Intake and Output Summary (Last 24 hours):  I/O last 3 completed shifts:  In: 3630.57 [I.V.:1063.19; IV Piggyback:871.67]  Out: 2814 [Other:2814]    Labs:     Results       Procedure Component Value Units Date/Time    Basic Metabolic Panel [[id number]]  (Abnormal) Collected: 07/03/22 1412    Specimen: Blood, Venous Updated: 07/03/22 1507     Glucose 269 mg/dL      BUN 15 mg/dL      Creatinine 1.4 mg/dL      Calcium 8.3 mg/dL      Sodium 742 mEq/L      Potassium 3.5 mEq/L      Chloride 103 mEq/L      CO2 25 mEq/L      Anion Gap 9.0     GFR 36.9 mL/min/1.73 m2     Magnesium [[id number]]  (Normal) Collected: 07/03/22 1412    Specimen: Blood, Venous Updated: 07/03/22 1507     Magnesium 1.9 mg/dL     Phosphorus [[id number]]  (  Abnormal) Collected: 07/03/22 1412    Specimen: Blood, Venous Updated: 07/03/22 1507     Phosphorus 1.6 mg/dL     Calcium, Ionized [[id number]]  (Normal) Collected: 07/03/22 1412    Specimen: Blood, Intravenous Line Updated: 07/03/22 1430     Calcium, Ionized 2.38 mEq/L     Hepatitis B (HBV) Surface Antigen with Reflex to Confirmation [[id number]] Collected: 07/03/22 0951    Specimen: Blood, Venous Updated: 07/03/22 0956    Hepatic Function Panel (LFT) [[id number]]  (Abnormal) Collected: 07/03/22 0347    Specimen: Blood, Venous Updated: 07/03/22 0520     Bilirubin, Total 0.2 mg/dL      Bilirubin Direct 0.1 mg/dL      Bilirubin Indirect 0.1 mg/dL      AST (SGOT) 16 U/L      ALT 16 U/L      Alkaline Phosphatase 61 U/L      Albumin 1.4 g/dL      Globulin 3.7 g/dL      Albumin/Globulin Ratio 0.4     Protein, Total 5.1 g/dL     Magnesium [[id number]]  (Normal) Collected: 07/03/22 0347    Specimen: Blood, Venous Updated: 07/03/22 0520     Magnesium 2.4 mg/dL     Renal Function Panel [[id number]]  (Abnormal) Collected: 07/03/22 0347    Specimen: Blood, Venous Updated:  07/03/22 0520     Glucose 238 mg/dL      Sodium 259 mEq/L      Potassium 4.3 mEq/L      Chloride 106 mEq/L      CO2 23 mEq/L      BUN 23 mg/dL      Calcium 56.3 mg/dL      Creatinine 1.9 mg/dL      Albumin 1.4 g/dL      Phosphorus 1.3 mg/dL      Anion Gap 7.0     GFR 25.6 mL/min/1.73 m2     CBC without Differential [[id number]]  (Abnormal) Collected: 07/03/22 0347    Specimen: Blood, Venous Updated: 07/03/22 0409     WBC 13.59 x10 3/uL      Hemoglobin 7.6 g/dL      Hematocrit 51.8 %      Platelet Count 129 x10 3/uL      MPV 10.5 fL      RBC 2.49 x10 6/uL      MCV 91.2 fL      MCH 30.5 pg      MCHC 33.5 g/dL      RDW 15 %      nRBC % 0.4 /100 WBC      Absolute nRBC 0.05 x10 3/uL     Calcium, Ionized [[id number]]  (Abnormal) Collected: 07/03/22 0347    Specimen: Blood, Intravenous Line Updated: 07/03/22 0403     Calcium, Ionized 2.82 mEq/L     Prepare / Crossmatch Red Blood Cells:  One Unit, 1 Units [[id number]] Collected: 06/29/22 0556     Updated: 07/03/22 0057     RBC Leukoreduced RBC LR     Unit Number [id number]     Product Status transfused     Product Code E0336V00     Unit Blood Type O POS     Expiration Date [id number]    Calcium, Ionized [[id number]]  (Abnormal) Collected: 07/02/22 2123    Specimen: Blood, Venous Updated: 07/02/22 2135     Calcium, Ionized 2.88 mEq/L     Renal Function Panel [[id number]]  (Abnormal) Collected: 07/02/22 1749    Specimen: Blood, Venous  Updated: 07/02/22 1814     Glucose 196 mg/dL      Sodium 161 mEq/L      Potassium 4.5 mEq/L      Chloride 105 mEq/L      CO2 26 mEq/L      BUN 13 mg/dL      Calcium 09.6 mg/dL      Creatinine 1.2 mg/dL      Albumin 1.4 g/dL      Phosphorus 1.4 mg/dL      Anion Gap 6.0     GFR 44.4 mL/min/1.73 m2     Calcium, Ionized [[id number]]  (Abnormal) Collected: 07/02/22 1523    Specimen: Blood, Venous Updated: 07/02/22 1559     Calcium, Ionized 2.87 mEq/L             Rads:     Radiology Results (24 Hour)       ** No results found for the last 24 hours. **               Medications:     Current Facility-Administered Medications   Medication Dose Route Frequency    cefepime  1 g Intravenous Q24H    heparin (porcine)  5,000 Units Subcutaneous Q12H SCH    insulin lispro  2-10 Units Subcutaneous Q4H SCH    metroNIDAZOLE  500 mg Intravenous Q8H    thiamine  100 mg Oral Daily      Adult Central Parenteral Nutrition (TPN) 2-in-1 42 mL/hr at 07/03/22 1200    And    SMOFLIPID (soy-MCT-olive-fish oil) Stopped (07/03/22 0502)    dexmedeTOMIDine Stopped (07/03/22 1215)    niCARdipine       benzocaine, dextrose **OR** dextrose **OR** dextrose **OR** glucagon (rDNA), HYDROmorphone, labetalol, naloxone     Physical Exam:     Physical Exam  Vitals reviewed.   Constitutional:       General: She is not in acute distress.     Appearance: She is not ill-appearing, toxic-appearing or diaphoretic.   Eyes:      General: No scleral icterus.  Cardiovascular:      Rate and Rhythm: Normal rate and regular rhythm.      Pulses: Normal pulses.   Pulmonary:      Effort: Pulmonary effort is normal.      Comments: On RA  Abdominal:      General: There is no distension.      Palpations: Abdomen is soft.      Tenderness: There is no abdominal tenderness.      Comments: Wound vac in place holding suction, serosanguinous output in canister   Genitourinary:     Comments: Undergoing CRRT  Skin:     General: Skin is warm.      Coloration: Skin is not jaundiced.

## 2022-07-03 NOTE — Progress Notes (Signed)
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 will plan for new permcath next week - has quinton in place as permacath stopped working     Case discussed with: HD nurse, daughter  Debara Pickett, MD, MD  [facility]  [phone] (office)  X [phone] (FFX Spectra-Link)      Subjective:  on HD, BP stable  Confused, on RA    Review of Systems:   Unable to assess, patient is confused     Physical Exam:     Vitals:    07/03/22 1015 07/03/22 1030 07/03/22 1045 07/03/22 1100   BP: 174/70 154/66 159/75 167/71   Pulse: (!) 109 (!) 106 (!) 114 (!) 113   Resp: (!) 31 (!) 26 (!) 33 (!) 36   Temp:       TempSrc:       SpO2: 99% 99% 99% 99%   Weight:       Height:           Intake and Output Summary (Last 24 hours) at Date Time    Intake/Output Summary (Last 24 hours) at 07/03/2022 1119  Last data filed at 07/03/2022 0900  Gross per 24 hour   Intake 2091.16 ml   Output 1122 ml   Net 969.16 ml       General:  confused  Cardiovascular: regular rate and rhythm  Lungs: bilateral air entry  Abdomen: soft  Extremities: edema noted in her hands    Access: TDC    Meds:      Scheduled Meds: PRN Meds:    cefepime, 1 g, Intravenous, Q24H  heparin (porcine), 5,000 Units, Subcutaneous, Q12H SCH  insulin lispro, 2-10 Units, Subcutaneous, Q4H SCH  metroNIDAZOLE, 500 mg, Intravenous, Q8H  thiamine, 100 mg, Oral, Daily          Continuous Infusions:   Adult Central Parenteral Nutrition (TPN) 2-in-1 42 mL/hr at 07/03/22 0900    And    SMOFLIPID (soy-MCT-olive-fish oil) Stopped (07/03/22 0502)    dexmedeTOMIDine 0.3 mcg/kg/hr (07/03/22 0900)    benzocaine, 1 spray, TID PRN  dextrose, 15 g of glucose, PRN   Or  dextrose, 12.5 g, PRN   Or  dextrose, 12.5 g, PRN   Or  glucagon (rDNA), 1 mg, PRN  HYDROmorphone, 0.5 mg, Q2H PRN  naloxone, 0.2 mg, PRN  sodium chloride, 100 mL, Q1H PRN  sodium  chloride, 250 mL, PRN              Labs:     Recent Labs   Lab 07/03/22  0347 07/02/22  0502 07/01/22  1120   WBC 13.59* 14.81* 18.74*   Hemoglobin 7.6* 7.7* 8.2*   Hematocrit 22.7* 22.8* 24.1*   Platelet Count 129* 102* 99*     Recent Labs   Lab 07/03/22  0347 07/02/22  1749 07/02/22  1122 07/02/22  0502 07/02/22  0053 07/01/22  2230   Sodium 136 137 138  More results in Results Review  --  136   Potassium 4.3 4.5 4.4  More results in Results Review  --  4.6   Chloride 106 105 105  More results in Results Review  --  107   CO2 23 26 26   More results in Results Review  --  22   BUN 23* 13 14  More results in Results Review  --  15   Creatinine 1.9* 1.2* 1.2*  More results in Results Review  --  1.6*   Calcium 10.4* 10.6* 10.3*  More results in Results Review  --  8.8   Albumin 1.4*  1.4* 1.4* 1.4*  More results in Results Review  --  1.4*   Phosphorus 1.3* 1.4* 1.1*  More results in Results Review  --  2.1*   Magnesium 2.4  --   --   --  2.5 2.5   Glucose 238* 196* 197*  More results in Results Review  --  176*   GFR 25.6* 44.4* 44.4*  More results in  Results Review  --  31.4*   More results in Results Review = values in this interval not displayed.           Signed by: Debara Pickett, MD, MD

## 2022-07-03 NOTE — SLP Eval Note (Signed)
[facility]    Speech Therapy Treatment Note Attempt   Patient: Shelby Barron MRN#: [id number]   Unit: MED SURG ICU 1 Room/Bed: A2910/A2910-01       Attempted to see patient at approximately 1144 am. Patient currently on dialysis. Patient lethargic. Currently on TPN.        SLP Order Cancellation Reason: Provider/Medical Hold (comment required) - Provider/RN decision     Will attempt again as schedule permits. Will continue to monitor for appropriateness.       07/03/2022   Madie Reno, CCC-SLP  [phone]

## 2022-07-03 NOTE — Plan of Care (Addendum)
Neuro: Pt awake restless at times. Precedex gtt was stopped around noon. Pt able to move all her extremities.   Non-violent soft restrains remain in place to keep Pt from removing life sustaining equipment.  Cardio: SR/Stach. A line was removed. Nicardipine gtt was started to get SBP less than 180. Pulses are palpable.   Resp: Sat 99-100% on RA  GI: No BM. When changing the pad this RN noted small brown spot  amount absorbed in the pad.  This happened twice. The second time RN saw that this liquid wasn't coming from the rectum but from the vagina. MD made aware.   GU: Pt tolerated HD.   Wound care was changed by MD.   Family updated at bedside.     Problem: Moderate/High Fall Risk Score >5  Goal: Patient will remain free of falls  Flowsheets (Taken 07/02/2022 0600 by Padpad, Kathlene November, RN)  Moderate Risk (6-13): MOD-Consider activation of bed alarm if appropriate     Problem: Non-Violent Restraints Interdisciplinary Plan  Goal: Will be injury free during the use of non-violent restraints  Flowsheets (Taken 06/30/2022 0601 by Octaviano Batty, RN)  Will be injury free during the use of non-violent restraints:   Attempt all alternatives before use of restraints   Initiate least restrictive type of restraint that is effective   Provide and maintain safe environment   Notify family of initiation of restraints   Include patient/family/caregiver in decisions related to safety   Ensure safety devices are properly applied and maintained   Document observed patient actions according to protocol   Nurse to accompany patient off unit when on restraints   Remove restraints before the indicated maximum length of time when meets criteria for discontinuation   Document significant changes in patient condition   Provide debriefing as soon as possible and appropriate   Reassess need for continued restraints   Ensure that order for restraints has not expired     Problem: Constipation  Goal: Fluid and electrolyte  balance are achieved/maintained  Flowsheets (Taken 06/30/2022 0601 by Octaviano Batty, RN)  Fluid and electrolyte balance are achieved/maintained:   Monitor/assess lab values and report abnormal values   Assess and reassess fluid and electrolyte status   Observe for cardiac arrhythmias   Monitor for muscle weakness     Problem: Hemodynamic Status: Cardiac  Goal: Stable vital signs and fluid balance  Flowsheets (Taken 06/30/2022 0601 by Octaviano Batty, RN)  Stable vital signs and fluid balance:   Assess signs and symptoms associated with cardiac rhythm changes   Monitor lab values     Problem: Renal Instability  Goal: Fluid and electrolyte balance are achieved/maintained  Flowsheets (Taken 07/03/2022 0914 by Winfield Rast, RN)  Fluid and electrolyte balance are achieved/maintained:   Monitor/assess lab values and report abnormal values   Observe for cardiac arrhythmias     Problem: Fluid and Electrolyte Imbalance/ Endocrine  Goal: Fluid and electrolyte balance are achieved/maintained  Flowsheets (Taken 06/30/2022 0601 by Octaviano Batty, RN)  Fluid and electrolyte balance are achieved/maintained:   Monitor/assess lab values and report abnormal values   Assess and reassess fluid and electrolyte status   Observe for cardiac arrhythmias   Monitor for muscle weakness

## 2022-07-03 NOTE — Progress Notes (Signed)
4 eyes in 4 hours pressure injury assessment note:      Completed with:   Unit & Time admitted:              Bony Prominences: Check appropriate box; if wound is present enter wound assessment in LDA     Occiput:                 [x]WNL  [] Wound present  Face:                     [x]WNL  [] Wound present  Ears:                      [x]WNL  [] Wound present  Spine:                    [x]WNL  [] Wound present  Shoulders:             [x]WNL  [] Wound present  Elbows:                  [x]WNL  [] Wound present  Sacrum/coccyx:     [x]WNL  [] Wound present  Ischial Tuberosity:  [x]WNL  [] Wound present  Trochanter/Hip:      [x]WNL  [] Wound present  Knees:                   [x]WNL  [] Wound present  Ankles:                   [x]WNL  [] Wound present  Heels:                    [x]WNL  [] Wound present  Other pressure areas:  [] Wound location       Device related: [] Device name:         LDA completed if wound present: yes/no  Consult WOCN if necessary    Other skin related issues, ie tears, rash, etc, document in Integumentary flowsheet

## 2022-07-03 NOTE — Plan of Care (Signed)
Plan of care    Midline abdominal wound VAC changed at bedside.  Prior sponge taken down with wound healthy in appearance.  Black sponge reapplied in 2 layers and secured with transparent tape.  Canister changed as well. Wound VAC connected to suction with good seal.  Patient tolerated procedure well.  Daughter at bedside, all questions and concerns addressed.        Adele Dan, MD  General Surgery, PGY4  July 03, 2022

## 2022-07-03 NOTE — Progress Notes (Signed)
Nutritional Support Services  Nutrition Note    Shelby Barron 82 y.o. female   MRN: [id number]      Nutrition Recommendations:     1. Complete PN bag, no plans for renewal    2. Monitor pt's ability to take PO    3. If pt unable to take PO and feeding tube placed, consider initiating tube feeding of Promote @ 24ml/hr, increase 10ml Q6 to goal rate of 70ml/hr x 24hrs/day, 30ml Q4 H2O flush   Provides 1680 kcals, 105g protein, free H2O   Meets 100% kcal/protein needs    4. Add Thiamine 100mg  PO daily for suspected deficiencies and risk of refeeding syndrome    5. Replete electrolytes as appropriate, continue to check  Phosphorus levels    6. Daily weights        Brief Nutrition Note: TPN day 2, patient receiving TPN at 68ml/hr. Phos level remains low, pt receiving phos in TPN and repletion outside of bag. NP provided order to initiate thiamine d/t refeeding s/s. Noted pt with BM overnight, plan to initiate PO/EN today and complete TPN today. Plan to transition from CRRT to HD today.      Nutrition Risk Level: High (will follow up at least 2 times per week and PRN)         Baxter Hire, RD, CNSC  Clinical Dietitian  [phone]

## 2022-07-03 NOTE — Progress Notes (Signed)
HD completed x3hrs net fluid off 1.8L, tolerated well,BP was trending down last 30 min of tx so had to adjust UF goal, nephrologist was informed,  pt is at baseline when i started HD, BP improved, , report was given to primary nurse.    Patient Vitals for the past 4 hrs:   BP Temp Pulse Resp   07/03/22 1210 169/85 98.7 F (37.1 C) (!) 105 (!) 29   07/03/22 1200 156/63 98.7 F (37.1 C) 90 (!) 24   07/03/22 1150 168/74 -- (!) 106 (!) 25   07/03/22 1145 168/74 -- (!) 110 (!) 27   07/03/22 1140 161/61 -- (!) 111 (!) 26   07/03/22 1130 118/53 -- (!) 109 (!) 30   07/03/22 1120 147/60 -- (!) 110 (!) 30   07/03/22 1115 144/69 -- (!) 112 (!) 36   07/03/22 1110 -- -- (!) 113 (!) 25   07/03/22 1100 167/71 -- (!) 113 (!) 36   07/03/22 1045 159/75 -- (!) 114 (!) 33   07/03/22 1030 154/66 -- (!) 106 (!) 26   07/03/22 1015 174/70 -- (!) 109 (!) 31   07/03/22 1000 164/69 -- (!) 113 19   07/03/22 0945 195/80 -- (!) 112 (!) 36   07/03/22 0930 (!) 212/88 -- (!) 104 (!) 31   07/03/22 0915 196/82 -- 94 (!) 37   07/03/22 0907 (!) 213/81 98.6 F (37 C) (!) 104 (!) 36   07/03/22 0900 -- -- (!) 105 (!) 38   07/03/22 0845 189/75 -- 91 (!) 24   07/03/22 0841 (!) 213/81 98.6 F (37 C) (!) 105 (!) 35           07/03/22 1210   Treatment Summary   Time Off Machine 1210   Duration of Treatment (Hours) 3   Treatment Type 1:1   Dialyzer Clearance Lightly streaked   Fluid Volume Off (mL) 2400   Prime Volume (mL) 200   Rinseback Volume (mL) 200   Fluid Given: Normal Saline (mL) 200   Fluid Given: PRBC  0 mL   Fluid Given: Albumin (mL) 0   Fluid Given: Other (mL) 0   Total Fluid Given 600   Hemodialysis Net Fluid Removed 1800   Post Treatment Assessment   Post-Treatment Weight (Kg) -1.8   Patient Response to Treatment fairly tolerated   Temporary Catheter with Pigtail 06/30/22 Temporary Catheter Right   Placement Date/Time: 06/30/22 1700   Access Type: Temporary Catheter  Orientation: Right  Central Line Infection Prevention Education provided?:  Yes  Hand Hygiene: Chlorhexidine;Alcohol based hand scrub  Line cart used?: Yes  Line kit used?: Yes  Maxi...   Line necessity reviewed? Apheresis/hemodialysis   Site Assessment Clean;Dry;Intact   Catheter Lumen Volume Venous 1.2 mL   Catheter Lumen Volume Arterial 1.2 mL   Distal Lumen Status Infusing   Tego/Curos Caps on Catheter Yes   NEW Tego/Curos Caps placed (Date) 07/03/22   APHERESIS ONLY:  Access  Red   APHERESIS ONLY: Return Blue   End Caps Free From Blood Yes   Line Care Connections checked and tightened   Dressing Type Transparent;Biopatch   Dressing Status Clean;Dry;Intact   Dressing/Line Intervention Dressing reinforced   Line Used For Blood Draw No   Dressing Change Due 07/06/22   Vitals   Temp 98.7 F (37.1 C)   Heart Rate (!) 105   Resp Rate (!) 29   BP 169/85   Arterial Line BP 198/63   Arterial Line MAP (  mmHg) 113 mmHg   SpO2 98 %   Assessment   Cardiac (WDL) WDL   Cardiac Rhythm Normal sinus rhythm   Respiratory  WDL   Respiratory Pattern Return to WDL   R Breath Sounds Diminished   L Breath Sounds Diminished   Cough Spontaneous   Edema  WDL   Generalized Edema Non Pitting Edema   General Skin Color Appropriate for ethnicity   Skin Condition/Temp Return to WDL   Gastrointestinal (WDL) WDL   Mobility Bed   Pain Assessment   Charting Type Reassessment   Pain Scale Used CPOT   Numeric Pain Scale   Pain Score 0   POSS Score 1   Bedside Nurse Communication   Name of bedside RN - post dialysis Wynonia Musty, RN

## 2022-07-03 NOTE — Plan of Care (Signed)
HD ongoing  Problem: Renal Instability  Goal: Fluid and electrolyte balance are achieved/maintained  Flowsheets (Taken 07/03/2022 0914)  Fluid and electrolyte balance are achieved/maintained:   Monitor/assess lab values and report abnormal values   Observe for cardiac arrhythmias     Problem: Patient Receiving Advanced Renal Therapies  Goal: Therapy access site remains intact  Flowsheets (Taken 07/03/2022 0914)  Therapy access site remains intact:   Assess therapy access site   Change therapy access site dressing as needed

## 2022-07-03 NOTE — Treatment Plan (Signed)
4 eyes in 4 hours pressure injury assessment note:      Completed with:   Unit & Time admitted:              Bony Prominences: Check appropriate box; if wound is present enter wound assessment in LDA     Occiput:                 [x]WNL  [] Wound present  Face:                     [x]WNL  [] Wound present  Ears:                      [x]WNL  [] Wound present  Spine:                    [x]WNL  [] Wound present  Shoulders:             [x]WNL  [] Wound present  Elbows:                  [x]WNL  [] Wound present  Sacrum/coccyx:     [x]WNL  [] Wound present  Ischial Tuberosity:  [x]WNL  [] Wound present  Trochanter/Hip:      [x]WNL  [] Wound present  Knees:                   [x]WNL  [] Wound present  Ankles:                   [x]WNL  [] Wound present  Heels:                    [x]WNL  [] Wound present  Other pressure areas:  [] Wound location       Device related: [] Device name:         LDA completed if wound present: yes/no  Consult WOCN if necessary    Other skin related issues, ie tears, rash, etc, document in Integumentary flowsheet

## 2022-07-03 NOTE — Progress Notes (Signed)
[facility] PROGRESS NOTE      Date of Note: 07/03/2022   Patient Name: Shelby Barron     Date of Birth:  Jul 24, 1940     MRN:               [id number]     PCP:               Patsy Lager, MD     PLAN     Postop day #5 status post radical cystectomy and bilateral nephrectomy.  An enterotomy resulted at that time due to adhesions.  Postop day #3 status post abdominal closure, bowel anastomosis, closure of mesenteric tract    I will discuss/discussed the plan of care with the following services:  Nursing    I spent a total of 15 minutes involved in this patient's care    Neldon Newport, MD  07/03/2022, 10:02 AM    SUBJECTIVE     Interval History: Patient is currently undergoing dialysis.  She is awake but less communicative this morning.      Patient Active Problem List    Diagnosis Date Noted    Anemia 06/30/2022    COPD (chronic obstructive pulmonary disease) 06/30/2022    Bladder cancer 06/28/2022    Malignant neoplasm of urinary bladder, unspecified site 06/25/2022    Malignant tumor of kidney, left 06/25/2022    GERD (gastroesophageal reflux disease) 06/25/2022    Leukocytosis 06/25/2022    Chronic diastolic congestive heart failure 06/23/2022    Aortic atherosclerosis 06/23/2022    Peritonitis 05/26/2022    ESRD (end stage renal disease) 05/26/2022    Hamstring injury 05/10/2013    Benign essential hypertension 02/10/2013    Left renal mass 11/28/2012    Type 2 diabetes mellitus with kidney complication, with long-term current use of insulin 07/31/2010    Diabetes mellitus type II, uncontrolled 07/31/2010    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   Respiratory: Normal rate. No retractions or increased work of breathing.   Abdomen: non-distended, soft, non-tender, no rebound or guarding    LABS & IMAGING     CBC  Recent Labs     07/03/22  0347 07/02/22  0502 07/01/22  1120   WBC 13.59* 14.81* 18.74*   RBC 2.49* 2.52* 2.71*   Hematocrit 22.7* 22.8* 24.1*   MCV 91.2 90.5 88.9   MCH 30.5 30.6 30.3   MCHC 33.5 33.8 34.0   RDW 15 15 15    MPV 10.5 10.9 11.1       BMP  Recent Labs     07/03/22  0347 07/02/22  1749 07/02/22  1122   CO2 23 26 26    Anion Gap 7.0 6.0 7.0   BUN 23* 13  14   Creatinine 1.9* 1.2* 1.2*       INR  Lab Results   Component Value Date/Time    INR 1.1 07/01/2022 11:20 AM    INR 1.1 06/01/2022 03:15 AM         IMAGING:  XR Chest AP Portable   Final Result         1. Lines and tubes in adequate position. No pneumothorax.   2. Trace left pleural effusion and bibasilar atelectasis.      Jasmine December D'Heureux, MD   06/30/2022 4:53 PM      XR Chest AP Portable   Final Result      Catheters as described.      Pankaj Dominica, MD   06/30/2022 2:07 PM      XR Chest AP Portable   Final Result    Right mainstem bronchial intubation. The tube should be   withdrawn about 4 cm.      Dr. Tildon Husky is aware.      Wynema Birch, MD   06/28/2022 4:56 PM      XR OR MISCOUNT   Final Result    No unexpected foreign body.      These critical results were discussed with and acknowledged by Angie R.,   the circulating nurse, who will communicate the findings to Everardo All,   MD on 06/28/2022 3:02 PM.         Bosie Helper, MD   06/28/2022 3:03 PM

## 2022-07-03 NOTE — Progress Notes (Addendum)
Arrived pt room 2910,  restless and confused, on moderate sedation, daughter at bedside, hand mitten in place, Started HD via Right IJ working well,  BP on high side, will continue to monitor during HD, report was received from primary nurse.       07/03/22 0841   Bedside Nurse Communication   Name of bedside RN - pre dialysis Wynonia Musty, RN   Treatment Initiation- With Dialysis Precautions   Time Out/Safety Check Completed Yes   Consent for HD signed for this hospitalization (Date) 06/01/22   Consent for HD signed for this hospitalization (Time) 1516   Blood Consent Verified N/A   Dialysis Precautions All Connections Secured   Dialysis Treatment Type Routine   Hepatitis Status   HBsAg (Antigen) Result Unknown   HBsAg Date Drawn 07/03/22   Dialysis Weight   Pre-Treatment Weight (Kg) 93   Vitals   Temp 98.6 F (37 C)   Heart Rate (!) 105   Resp Rate (!) 35   BP (!) 213/81   Assessment   Cardiac (WDL) WDL   Cardiac Rhythm Normal sinus rhythm   Respiratory  WDL   Respiratory Pattern Return to WDL   R Breath Sounds Diminished   L Breath Sounds Diminished   Cough Spontaneous   Edema  WDL   Generalized Edema Non Pitting Edema   Skin Condition/Temp Cool   Temporary Catheter with Pigtail 06/30/22 Temporary Catheter Right   Placement Date/Time: 06/30/22 1700   Access Type: Temporary Catheter  Orientation: Right  Central Line Infection Prevention Education provided?: Yes  Hand Hygiene: Chlorhexidine;Alcohol based hand scrub  Line cart used?: Yes  Line kit used?: Yes  Maxi...   Line necessity reviewed? Apheresis/hemodialysis   Site Assessment Clean;Dry;Intact   Catheter Lumen Volume Venous 1.2 mL   Catheter Lumen Volume Arterial 1.2 mL   Distal Lumen Status Infusing   Tego/Curos Caps on Catheter Yes   NEW Tego/Curos Caps placed (Date) 07/03/22   APHERESIS ONLY:  Access  Red   APHERESIS ONLY: Return [facility] Connections checked and tightened   Dressing Type Transparent;Biopatch   Dressing Status Clean;Dry;Intact    Line Used For Blood Draw No   Dressing Change Due 07/06/22   Pain Assessment   Charting Type Assessment   Pain Scale Used CPOT   Numeric Pain Scale   Pain Score 0   POSS Score 1   Hemodialysis Comments   Pre-Hemodialysis Comments safety check done

## 2022-07-03 NOTE — Progress Notes (Signed)
[facility] ([facility])  Progress Note      Patient Name: Shelby Barron  MRN: [id number]  Room: V4098/J1914-78    Reason for Admission: Bladder cancer  ICU Day: 4d 17h      Overview   Shelby Barron is a 82 y.o. female who presents with history of PD, CVA, type 2 diabetes presented for elective cystectomy and completion nephrectomy.  OR complicated by small bowel enterotomy and hemorrhagic shock.    Interval Events   24 hour events: Brief hypotension now resolved    Subjective   Hospital Course:  6/10: Admitted - OR for cystectomy. Left abdomen open in discontinuity   6/11:Remains sedated on pressors, required 1 unit packed RBC transfusion, CRRT started but issues with the dialysis catheter  6/12: Remains sedated on pressors, required 1 unit packed RBC transfusion, CRRT started but issues with the dialysis catheter.  The morning of June 12 she went to the OR for small bowel anastomosis, closure of mesenteric defect, closure of abdominal wall with wound VAC application.   6/14: Remains delirious       Objective   Physical Examination   Physical Exam    Last vent settings if on vent:       Vitals Temp:  [96.3 F (35.7 C)-99.4 F (37.4 C)]   Heart Rate:  [60-114]   Resp Rate:  [18-49]   BP: (87-213)/(39-91)   Arterial Line BP: (93-203)/(27-74)   SpO2:  [92 %-99 %]   Weight:  [93 kg (205 lb 0.4 oz)]   BMI (calculated):  [37.5]  // Temperature with 24 range    Input / Output:   Intake/Output Summary (Last 24 hours) at 07/03/2022 1007  Last data filed at 07/03/2022 0900  Gross per 24 hour   Intake 2187.96 ml   Output 1223 ml   Net 964.96 ml       Neuro:    Delirious - intermittently following commands. Not talking. Moving all extremities purposefully    Lungs:   Good air entry bilaterally no wheezing    Cardiac:   Normal heart sound no added sound    Abdomen:    Slightly distended, wound VAC in place, no rebound tenderness    Extremities:   Trace edema    Skin:     No rash        ICU  Checklist  Sedation:   CAM-ICU: Positive or Negative for Delirium: Positive   CAM ICU:   Last Documented RASS: RASS Score: Restless   Currently ordered infusions:    Adult Central Parenteral Nutrition (TPN) 2-in-1 42 mL/hr at 07/03/22 0900    And    SMOFLIPID (soy-MCT-olive-fish oil) Stopped (07/03/22 0502)    dexmedeTOMIDine 0.3 mcg/kg/hr (07/03/22 0900)    Reviewed: Yes   Mobility:   Current Mobility Level:   PMP Activity: Step 3 - Bed Mobility (07/03/2022  8:00 AM)    Current PT Order: No  Current OT Order: No Reviewed: Yes   Respiratory (n/a if blank):   Ventilator Time:    Last Recorded Vent Mode:    Reviewed: Yes   Gastrointenstinal  Last Bowel Movement:   Last BM Date: 07/03/22 Reviewed: Yes   CAUTI Prevention (n/a if blank):  Foley Day:        Reviewed: Yes   Blood Steam Infection Prevention (n/a if blank):   Permacath Catheter - Tunneled 06/01/22 Internal Jugular Right  Peripheral Arterial Line 06/28/22 Right Radial  Temporary Catheter with Pigtail  06/30/22 Temporary Catheter Right Reviewed: Yes   DVT Chemoprophylaxis (none if blank):   heparin (porcine) injection 5,000 Units  Reviewed: Yes       Assessment   Shelby Barron is a 82 y.o. female who presents with history of ESRD, CVA, type 2 diabetes presented for elective cystectomy and completion nephrectomy.  OR complicated by small bowel enterotomy and hemorrhagic shock.       Patient has BMI=Body mass index is 37.5 kg/m.  Diagnosis: No additional diagnosis based on BMI criteria            Recent Labs     07/03/22  0347 07/02/22  0502 07/01/22  1120 07/01/22  0506 06/30/22  2310 06/30/22  1646 06/30/22  1226   Platelet Count 129* 102* 99* 107* 105* 113* 100*     Diagnosis: Mild Thrombocytopenia             Recent Labs     07/03/22  0347 07/02/22  1749 07/02/22  1122 07/02/22  0502 07/01/22  2230 07/01/22  1705 07/01/22  1120 07/01/22  0506 06/30/22  2310 06/30/22  1646 06/30/22  1226   Potassium 4.3 4.5 4.4 4.2 4.6 4.9 4.8 4.8 4.9 5.6* 5.4*      Diagnosis: No additional diagnosis             Recent Labs   Lab 07/03/22  0347 07/02/22  0502 07/01/22  1120   Hemoglobin 7.6* 7.7* 8.2*   Hematocrit 22.7* 22.8* 24.1*   MCV 91.2 90.5 88.9   WBC 13.59* 14.81* 18.74*   Platelet Count 129* 102* 99*     Recent Labs   Lab 06/02/22  0514   Iron 62   TIBC 145*   Iron Saturation 43     Anemia Diagnosis: No new diagnosis   Malnutrition Documentation    Moderate Malnutrition related to inadequate nutritional intake in the setting of acute illness as evidenced by <50% EER > 5 days, mild muscle losses (temporalis, deltoid) - new                    Plan   Plan   NEUROLOGICAL:   #History of CVA  #Encephalopathy/delirium  Hold home plavix  Delirium preventing strategy  Avoid benzodiazepine  Pain control, currently using Dilaudid as needed  Intermittently following commands  Precedex at 0.2 - will trial off following HD     CARDIOVASCULAR:   #Mixed shock - hemorrhagic vs septic: Resolved  Had EBL approximately 1 L in the OR on 6/10 also with small bowel enterotomy  Transfused 3 units PRBC in OR. Received another unit on 6/11 and 6/12  MAP greater than 65  Had TTE as part of preop cardiac clearance  Mild aortic stenosis (AVA 1.7), grade 1 diastolic dysfunction, normal LV/RV systolic function  Hypertensive to 190s - will monitor while on HD - if remains hypertensive will consider starting antihypertensives     PULMONARY:   #Acute hypoxemic respiratory failure  #COPD  Extubated and doing well.  Comfortable on 2 L nasal cannula     RENAL:   #ESRD  Transition to intermittent HD per nephrology today  Nephrology following     GASTROINTESTINAL:  #S/P Cystectomy and completion nephrectomy  6/10 underwent cystectomy and nephrectomy complicated by small bowel enterotomy and hemorrhagic shock  Went to the OR on June 12 for small bowel anastomosis, closure of mesenteric defect, closure of abdominal wall with wound VAC application  -1 bowel movement overnight.  SLP evaluation ordered will  stop TPN today.  If unable to swallow we will consider NG placement for enteral tube feeding  -Wound VAC changed today     INFECTIOUS DISEASE:   Cefepime/Flagyl per ID     HEMATOLOGY/ONCOLOGY:   #Renal cell carcinoma  #Urothelial carcinoma  #Thrombocytopenia  -Plts 129  - VTE ppx: SCDs, subcutaneous heparin     ENDOCRONOLOGY/RHEUMATOLOGY:   #Diabetes Mellitus type 2  ISS, goal less than 180     Discussed with the patient's daughter at bedside    This patient is critically ill with life-threatening condition(s) and a high probability of sudden clinically significant deterioration due to the condition(s) noted in the assessment and plan, which requires the highest level of physician/advance practice provider preparedness to intervene urgently. Full attention to the direct care of this patient was provided for the period of time noted below. Any critical care time performed today is exclusive of teaching and billable procedures and not overlapping with any other physicians or advance practice providers.    I have personally assessed the patient and based my assessment and medical decision-making on a review of the patient's history and 24-hour interval events along with medical records, physical examination, vital signs, analysis of recent laboratory results, evaluation of radiology images, monitoring data for potential decompensation, and additional findings found in detail within ICU team notes. The findings and plan of care was discussed with the care team.    Total critical care time: 37 minutes during this encounter.    Velia Meyer, MD   07/03/2022 10:07 AM

## 2022-07-04 DIAGNOSIS — C679 Malignant neoplasm of bladder, unspecified: Secondary | ICD-10-CM

## 2022-07-04 LAB — CBC
Absolute nRBC: 0.03 10*3/uL — ABNORMAL HIGH (ref <=0.00)
Hematocrit: 24.2 % — ABNORMAL LOW (ref 34.7–43.7)
Hemoglobin: 8 g/dL — ABNORMAL LOW (ref 11.4–14.8)
MCH: 30.4 pg (ref 25.1–33.5)
MCHC: 33.1 g/dL (ref 31.5–35.8)
MCV: 92 fL (ref 78.0–96.0)
MPV: 10.2 fL (ref 8.9–12.5)
Platelet Count: 174 10*3/uL (ref 142–346)
RBC: 2.63 10*6/uL — ABNORMAL LOW (ref 3.90–5.10)
RDW: 15 % (ref 11–15)
WBC: 19.25 10*3/uL — ABNORMAL HIGH (ref 3.10–9.50)
nRBC %: 0.2 /100 WBC — ABNORMAL HIGH (ref <=0.0)

## 2022-07-04 LAB — WHOLE BLOOD GLUCOSE POCT
Whole Blood Glucose POCT: 160 mg/dL — ABNORMAL HIGH (ref 70–100)
Whole Blood Glucose POCT: 169 mg/dL — ABNORMAL HIGH (ref 70–100)
Whole Blood Glucose POCT: 170 mg/dL — ABNORMAL HIGH (ref 70–100)
Whole Blood Glucose POCT: 181 mg/dL — ABNORMAL HIGH (ref 70–100)
Whole Blood Glucose POCT: 184 mg/dL — ABNORMAL HIGH (ref 70–100)
Whole Blood Glucose POCT: 217 mg/dL — ABNORMAL HIGH (ref 70–100)

## 2022-07-04 LAB — BASIC METABOLIC PANEL
Anion Gap: 13 (ref 5.0–15.0)
BUN: 29 mg/dL — ABNORMAL HIGH (ref 7–21)
CO2: 24 mEq/L (ref 17–29)
Calcium: 7.9 mg/dL (ref 7.9–10.2)
Chloride: 104 mEq/L (ref 99–111)
Creatinine: 2.6 mg/dL — ABNORMAL HIGH (ref 0.4–1.0)
GFR: 17.5 mL/min/{1.73_m2} — ABNORMAL LOW (ref >=60.0)
Glucose: 206 mg/dL — ABNORMAL HIGH (ref 70–100)
Potassium: 3.9 mEq/L (ref 3.5–5.3)
Sodium: 141 mEq/L (ref 135–145)

## 2022-07-04 LAB — CALCIUM, IONIZED: Calcium, Ionized: 2.37 mEq/L (ref 2.23–2.56)

## 2022-07-04 LAB — MAGNESIUM: Magnesium: 2.1 mg/dL (ref 1.6–2.6)

## 2022-07-04 LAB — PHOSPHORUS: Phosphorus: 2 mg/dL — ABNORMAL LOW (ref 2.3–4.7)

## 2022-07-04 MED ORDER — THIAMINE IV SYRINGE (ADULT)
100.0000 mg | INTRAMUSCULAR | Status: DC
Start: 2022-07-04 — End: 2022-07-22
  Administered 2022-07-04 – 2022-07-22 (×19): 100 mg via INTRAVENOUS
  Filled 2022-07-04 (×2): qty 1
  Filled 2022-07-04: qty 2
  Filled 2022-07-04 (×3): qty 1
  Filled 2022-07-04 (×7): qty 2
  Filled 2022-07-04 (×2): qty 1
  Filled 2022-07-04 (×3): qty 2
  Filled 2022-07-04 (×2): qty 1
  Filled 2022-07-04 (×2): qty 2

## 2022-07-04 NOTE — Plan of Care (Addendum)
N: GCS 13/15- restless at times, RASS 0 to -1  - On Bilateral Mittens, Non Violent soft restraints to prevent from interference of medical treatment    CV: NSR to S. Tachy HR 90's to 100's  - on Nicardipine infusion titrated per Goal SBP less than , at current rate 2.5mg /hr  - T Max: 99.9 C- cold pack rendered    Respi: Room Air saturating well at 95% 99%    G.I: NPO, for Swallowing assessment, OFF Lipids drip  - Post op site at abdomen- dressing intact on wound vac  dressing    G.U: anuric: on HD last HD on 6/15     Skin: post op site at abdomen on vac dressing, LLQ abdomen with staples noted    All needs attended  Nursing care rendered  All daughter's concerns were addressed to ICU team     Problem: Moderate/High Fall Risk Score >5  Goal: Patient will remain free of falls  Outcome: Progressing  Flowsheets (Taken 07/04/2022 0200)  High (Greater than 13):   HIGH-Visual cue at entrance to patient's room   HIGH-Bed alarm on at all times while patient in bed   HIGH-Initiate use of floor mats as appropriate   HIGH-Apply yellow "Fall Risk" arm band     Problem: Non-Violent Restraints Interdisciplinary Plan  Goal: Will be injury free during the use of non-violent restraints  Outcome: Progressing  Flowsheets (Taken 07/04/2022 0312)  Will be injury free during the use of non-violent restraints:   Attempt all alternatives before use of restraints   Initiate least restrictive type of restraint that is effective   Provide and maintain safe environment   Notify family of initiation of restraints   Include patient/family/caregiver in decisions related to safety   Ensure safety devices are properly applied and maintained   Document observed patient actions according to protocol   Remove restraints before the indicated maximum length of time when meets criteria for discontinuation   Nurse to accompany patient off unit when on restraints   Document significant changes in patient condition   Provide debriefing as soon as  possible and appropriate   Reassess need for continued restraints   Ensure that order for restraints has not expired     Problem: Compromised Activity/Mobility  Goal: Activity/Mobility Interventions  Outcome: Progressing  Flowsheets (Taken 07/04/2022 0312)  Activity/Mobility Interventions: Pad bony prominences, TAP Seated positioning system when OOB, Promote PMP, Reposition q 2 hrs / turn clock, Offload heels     Problem: Compromised Friction/Shear  Goal: Friction and Shear Interventions  Outcome: Progressing  Flowsheets (Taken 07/04/2022 0312)  Friction and Shear Interventions: Pad bony prominences, Off load heels, HOB 30 degrees or less unless contraindicated, Consider: TAP seated positioning, Heel foams     Problem: Inadequate Airway Clearance  Goal: Patent Airway maintained  Outcome: Progressing  Flowsheets (Taken 07/04/2022 0312)  Patent airway maintained:   Position patient for maximum ventilatory efficiency   Provide adequate fluid intake to liquefy secretions   Suction secretions as needed   Reinforce use of ordered respiratory interventions (i.e. CPAP, BiPAP, Incentive Spirometer, Acapella, etc.)   Reposition patient every 2 hours and as needed unless able to self-reposition  Goal: Normal respiratory rate/effort achieved/maintained  Outcome: Progressing  Flowsheets (Taken 07/04/2022 0312)  Normal respiratory rate/effort achieved/maintained: Plan activities to conserve energy: plan rest periods     Problem: Infection Prevention  Goal: Free from infection  Outcome: Progressing  Flowsheets (Taken 07/04/2022 0312)  Free from infection:   Monitor/assess  vital signs   Encourage/assist patient to turn, cough and perform deep breathing every 2 hours   Assess incision for evidence of healing   Monitor/assess output from surgical drain if present   Assess for signs and symptoms of infection   Monitor/assess lab values and report abnormal values     Problem: Impaired Mobility  Goal: Mobility/Activity is maintained at  optimal level for patient  Outcome: Progressing  Flowsheets (Taken 07/04/2022 0312)  Mobility/activity is maintained at optimal level for patient:   Encourage independent activity per ability   Consult/collaborate with Physical Therapy and/or Occupational Therapy     Problem: Constipation  Goal: Fluid and electrolyte balance are achieved/maintained  Outcome: Progressing  Flowsheets (Taken 07/04/2022 0312)  Fluid and electrolyte balance are achieved/maintained:   Monitor/assess lab values and report abnormal values   Assess and reassess fluid and electrolyte status   Observe for cardiac arrhythmias   Monitor for muscle weakness     Problem: Safety  Goal: Patient will be free from injury during hospitalization  Outcome: Progressing  Flowsheets (Taken 07/04/2022 5409)  Patient will be free from injury during hospitalization:   Assess patient's risk for falls and implement fall prevention plan of care per policy   Provide and maintain safe environment   Use appropriate transfer methods   Ensure appropriate safety devices are available at the bedside   Include patient/ family/ care giver in decisions related to safety   Hourly rounding   Assess for patients risk for elopement and implement Elopement Risk Plan per policy   Provide alternative method of communication if needed (communication boards, writing)     Problem: Pain  Goal: Pain at adequate level as identified by patient  Outcome: Progressing  Flowsheets (Taken 07/04/2022 0312)  Pain at adequate level as identified by patient:   Identify patient comfort function goal   Assess for risk of opioid induced respiratory depression, including snoring/sleep apnea. Alert healthcare team of risk factors identified.   Assess pain on admission, during daily assessment and/or before any "as needed" intervention(s)   Reassess pain within 30-60 minutes of any procedure/intervention, per Pain Assessment, Intervention, Reassessment (AIR) Cycle   Evaluate if patient comfort function  goal is met   Evaluate patient's satisfaction with pain management progress   Offer non-pharmacological pain management interventions     Problem: Hemodynamic Status: Cardiac  Goal: Stable vital signs and fluid balance  Outcome: Progressing  Flowsheets (Taken 07/04/2022 0312)  Stable vital signs and fluid balance:   Assess signs and symptoms associated with cardiac rhythm changes   Monitor lab values     Problem: Renal Instability  Goal: Fluid and electrolyte balance are achieved/maintained  Outcome: Progressing  Flowsheets (Taken 07/04/2022 0312)  Fluid and electrolyte balance are achieved/maintained:   Monitor/assess lab values and report abnormal values   Assess and reassess fluid and electrolyte status   Observe for cardiac arrhythmias   Follow fluid restrictions/IV/PO parameters     Problem: Fluid and Electrolyte Imbalance/ Endocrine  Goal: Fluid and electrolyte balance are achieved/maintained  Outcome: Progressing  Flowsheets (Taken 07/04/2022 0312)  Fluid and electrolyte balance are achieved/maintained:   Monitor/assess lab values and report abnormal values   Assess and reassess fluid and electrolyte status   Observe for cardiac arrhythmias   Monitor for muscle weakness

## 2022-07-04 NOTE — Progress Notes (Signed)
[facility] ([facility]  Progress Note      Patient Name: Shelby Barron  MRN: [id number]  Room: N5621/H0865-78    Reason for Admission: Bladder cancer  ICU Day: 5d 21h      Overview   Shelby Barron is a 82 y.o. female who presents with history of PD, CVA, type 2 diabetes presented for elective cystectomy and completion nephrectomy.  OR complicated by small bowel enterotomy and hemorrhagic shock.    Interval Events   24 hour events: Had iHD yesterday with 1.8L fluid removal. Off nicardipine gtt. This am, delirious, confused, moving all 4 extremities spontaneously     Subjective   Hospital Course:  6/10: Admitted - OR for cystectomy. Left abdomen open in discontinuity   6/11:Remains sedated on pressors, required 1 unit packed RBC transfusion, CRRT started but issues with the dialysis catheter  6/12: Remains sedated on pressors, required 1 unit packed RBC transfusion, CRRT started but issues with the dialysis catheter.  The morning of June 12 she went to the OR for small bowel anastomosis, closure of mesenteric defect, closure of abdominal wall with wound VAC application.   6/14: Remains delirious  6/15: Brief hypotension, now resolved      Objective   Physical Examination   Physical Exam    Last vent settings if on vent:       Vitals Temp:  [97.3 F (36.3 C)-99.9 F (37.7 C)]   Heart Rate:  [79-112]   Resp Rate:  [18-46]   BP: (133-215)/(60-142)   Arterial Line BP: (179-209)/(52-60)   SpO2:  [95 %-99 %]   Weight:  [87.8 kg (193 lb 9 oz)]   BMI (calculated):  [35.4]  // Temperature with 24 range    Input / Output:   Intake/Output Summary (Last 24 hours) at 07/04/2022 1352  Last data filed at 07/04/2022 1000  Gross per 24 hour   Intake 656.67 ml   Output 0 ml   Net 656.67 ml       Neuro:    AAO x 0, not following commands, moving all extremities     Lungs:   Coarse breath sounds bilaterally     Cardiac:   RRR, normal s1/s2    Abdomen:    Slightly distended, nontender, wound vac in  place     Extremities:   Trace edema     Skin:     Warm ,dry, intact        ICU Checklist  Sedation:   CAM-ICU: Positive or Negative for Delirium: Positive   CAM ICU:   Last Documented RASS: RASS Score: Alert and Calm   Currently ordered infusions:      Reviewed: Yes   Mobility:   Current Mobility Level:   PMP Activity: Step 5 - Chair (07/04/2022 12:00 PM)    Current PT Order: No  Current OT Order: No Reviewed: Yes   Respiratory (n/a if blank):   Ventilator Time:    Last Recorded Vent Mode:    Reviewed: Yes   Gastrointenstinal  Last Bowel Movement:   Last BM Date: 06/03/22 Reviewed: Yes   CAUTI Prevention (n/a if blank):  Foley Day:        Reviewed: Yes   Blood Steam Infection Prevention (n/a if blank):   Permacath Catheter - Tunneled 06/01/22 Internal Jugular Right  Temporary Catheter with Pigtail 06/30/22 Temporary Catheter Right Reviewed: Yes   DVT Chemoprophylaxis (none if blank):   heparin (porcine) injection 5,000 Units  Reviewed: Yes  Assessment   [name] is a 82 y.o. female who presents with history of ESRD, CVA, type 2 diabetes presented for elective cystectomy and completion nephrectomy.  OR complicated by small bowel enterotomy and hemorrhagic shock.       Patient has BMI=Body mass index is 35.4 kg/m.  Diagnosis: No additional diagnosis based on BMI criteria              Recent Labs     07/04/22  0432 07/03/22  0347 07/02/22  0502   Platelet Count 174 129* 102*     Diagnosis: Mild Thrombocytopenia             Recent Labs     07/04/22  0432 07/03/22  1412 07/03/22  0347 07/02/22  1749 07/02/22  1122 07/02/22  0502 07/01/22  2230 07/01/22  1705   Potassium 3.9 3.5 4.3 4.5 4.4 4.2 4.6 4.9     Diagnosis: No additional diagnosis               Recent Labs   Lab 07/04/22  0432 07/03/22  0347 07/02/22  0502   Hemoglobin 8.0* 7.6* 7.7*   Hematocrit 24.2* 22.7* 22.8*   MCV 92.0 91.2 90.5   WBC 19.25* 13.59* 14.81*   Platelet Count 174 129* 102*     Recent Labs   Lab 06/02/22  0514   Iron 62   TIBC 145*    Iron Saturation 43     Anemia Diagnosis: No new diagnosis   Malnutrition Documentation    Moderate Malnutrition related to inadequate nutritional intake in the setting of acute illness as evidenced by <50% EER > 5 days, mild muscle losses (temporalis, deltoid) - new                    Plan   Plan   NEUROLOGICAL:   #History of CVA  #Encephalopathy/delirium  Hold home plavix  - Adhere to day/night cycles and administer constant reorientation.   - CAM ICU monitoring with early initiation of atypical antipsychotics as needed for control of agitated delirium.   - Out of bed to chair with PT/OT consult as indicated.   - CPOT < 3, IV Tylenol; avoid opioids      CARDIOVASCULAR:   #Mixed shock - hemorrhagic vs septic: Resolved  Had EBL approximately 1 L in the OR on 6/10 also with small bowel enterotomy  Transfused 3 units PRBC in OR. Received another unit on 6/11 and 6/12  MAP greater than 65  Had TTE as part of preop cardiac clearance  Mild aortic stenosis (AVA 1.7), grade 1 diastolic dysfunction, normal LV/RV systolic function  Off nicardipine gtt, PRN IV labetalol for SBP > 180      PULMONARY:   #Acute hypoxemic respiratory failure  #COPD  Extubated on 6/13, currently on 2L NC   Maintain SaO2 > 92%     RENAL:   #ESRD  iHD Tues/Thurs/Sat     GASTROINTESTINAL:  #S/P Cystectomy and completion nephrectomy  6/10 underwent cystectomy and nephrectomy complicated by small bowel enterotomy and hemorrhagic shock  Went to the OR on June 12 for small bowel anastomosis, closure of mesenteric defect, closure of abdominal wall with wound VAC application  - SLP eval tomorrow, if fails will start enteral feeding tomorrow  -Wound VAC changed 6/15    INFECTIOUS DISEASE:   Cefepime/Flagyl per ID     HEMATOLOGY/ONCOLOGY:   #Renal cell carcinoma  #Urothelial carcinoma  #Thrombocytopenia  - VTE ppx:  SCDs, subcutaneous heparin     ENDOCRONOLOGY/RHEUMATOLOGY:   #Diabetes Mellitus type 2  ISS, goal less than 180     Discussed with the patient's  daughter at bedside    I have personally assessed the patient and based my assessment and medical decision-making on a review of the patient's history and 24-hour interval events along with medical records, physical examination, vital signs, analysis of recent laboratory results, evaluation of radiology images, and monitoring data. The findings and plan of care was discussed with the care team.      Total care time: 50 minutes    Domingo Dimes, MD  07/04/2022 4:24 PM

## 2022-07-04 NOTE — H&P (Signed)
[facility]      Patient: Shelby Barron  Date: 06/28/2022   DOB: 05-16-1940  Date of Admission: 06/28/2022   MRN: [id number]  Attending: Dorothyann Gibbs, MD         No chief complaint on file.       History Gathered From: Chart, other providers, patient not interactive    HISTORY AND PHYSICAL     Shelby Barron is a [name], [name], type 2 diabetes, renal cell carcinoma status post partial bilateral nephrectomy, [name], new diagnosis of urothelial cancer initally presented on 6/10 for elective cystectomy and completion nephrectomy, [name].      She was recently admitted in May 2024 at which time she was transitioned from PD to HD.  At that time was diagnosed with urothelial cancer.  She presented 06/28/2022 for scheduled cystectomy and completion nephrectomy with urology.  Her last Plavix dose was 6/3, her last dialysis was 6/8. She had preop clearance with cardiology which included an echo. [name], [name], extubated and off pressors since 6/13.     She is hypoactive and delirious. Surgery cleared her for diet however mental status is poor and failed SLP. SLP with try again tomorrow, if not will need to be started on tube feeds. Has [name] T/Th/Sat     Past Medical History:   Diagnosis Date    Acid reflux     Asthma, well controlled     Cancer of kidney     Chronic lower back pain     Congestive heart disease     COPD (chronic obstructive pulmonary disease)     CVA (cerebral infarction) 01/12/2003    Diabetes     Diabetic nephropathy     Fibroids     Gout     H/O: gout     Hyperlipidemia     Hypertension     Neuropathy of hand     SOB (shortness of breath)        Past Surgical History:   Procedure Laterality Date    APPENDECTOMY (OPEN)      CLOSURE, ENTEROTOMY, SMALL INTESTINE  06/28/2022    Procedure:  CLOSURE, ENTEROTOMY, SMALL INTESTINE;  Surgeon: Marlaine Hind, MD;  Location: [facility];  Service: General;;    CYSTECTOMY, ILEOCONDUIT N/A 06/28/2022    Procedure: CYSTECTOMY, RADICAL;  Surgeon: Neldon Newport, MD;  Location: [facility];  Service: Urology;  Laterality: N/A;    DRAIN (OTHER) N/A 05/31/2022    Procedure: DRAIN (OTHER);  Surgeon: Hope Pigeon, MD;  Location: [facility];  Service: Interventional Radiology;  Laterality: N/A;    EXPLORATORY LAPAROTOMY N/A 06/30/2022    Procedure: SMALL BOWEL ANASTOMOSIS, CLOSURE OF MESENTERIC DEFECT, CLOSURE OF ABDOMINAL WALL;  Surgeon: Marlaine Hind, MD;  Location: [facility];  Service: General;  Laterality: N/A;  **[id number]/ MD AVAIL [id number] OR AFTER 1600**    EXPLORATORY LAPAROTOMY, RESECTION SMALL BOWEL N/A 06/28/2022    Procedure: RESECTION SMALL BOWEL;  Surgeon: Marlaine Hind, MD;  Location: [facility];  Service: General;  Laterality: N/A;    HERNIA REPAIR      HYSTERECTOMY      LAPAROTOMY, NEPHRECTOMY Bilateral 06/28/2022    Procedure: BILATERAL OPEN RADICAL NEPHRECTOMY AND LYSIS OF ASHESION;  Surgeon: Neldon Newport,  MD;  Location: [facility];  Service: Urology;  Laterality: Bilateral;    PARTIAL NEPHRECTOMY      tumor removed from kidney  cJanuary 2015    TUNNELED CATH PLACEMENT (PERMCATH) N/A 06/01/2022    Procedure: TUNNELED CATH PLACEMENT;  Surgeon: Suszanne Finch, MD;  Location: [facility];  Service: Interventional Radiology;  Laterality: N/A;    TURBT      WOUND VAC APPLICATION N/A 06/28/2022    Procedure: Tawni Pummel WOUND VAC APPLICATION;  Surgeon: Marlaine Hind, MD;  Location: [facility];  Service: General;  Laterality: N/A;    WOUND VAC APPLICATION N/A 06/30/2022    Procedure: WOUND VAC APPLICATION;  Surgeon: Marlaine Hind, MD;  Location: [facility];  Service: General;  Laterality: N/A;       Prior to Admission medications    Medication Sig Start Date End Date Taking? Authorizing Provider   allopurinol (ZYLOPRIM) 100 MG tablet Take 1  tablet (100 mg) by mouth daily   Yes [provider]   atorvastatin (LIPITOR) 40 MG tablet Take 1 tablet (40 mg) by mouth daily   Yes [provider]   conjugated estrogens (PREMARIN) vaginal cream Place vaginally daily   Yes [provider]   insulin aspart (NOVOLOG) 100 UNIT/ML injection Inject 5-10 Units into the skin as needed   Yes [provider]   Insulin Degludec Evaristo Bury) 100 UNIT/ML Solution Inject 36 Units into the skin daily   Yes [provider]   metoprolol tartrate (LOPRESSOR) 25 MG tablet Take 0.5 tablets (12.5 mg) by mouth daily   Yes [provider]   montelukast (SINGULAIR) 10 MG tablet Take 1 tablet (10 mg) by mouth nightly   Yes [provider]   ondansetron (Zofran) 4 MG tablet Take 1 tablet (4 mg) by mouth every 8 (eight) hours as needed for Nausea 06/04/22  Yes Yousuf, Munib A, MD   oxybutynin XL (DITROPAN-XL) 5 MG 24 hr tablet Take 1 tablet (5 mg) by mouth daily   Yes [provider]   oxyCODONE (OXY-IR) 5 MG capsule Take 1 capsule (5 mg) by mouth every 4 (four) hours as needed   Yes [provider]   pantoprazole (PROTONIX) 40 MG tablet Take 1 tablet (40 mg) by mouth daily   Yes [provider]   pregabalin (LYRICA) 50 MG capsule Take 1 capsule (50 mg) by mouth Per pt has not been taking   Yes [provider]   sevelamer carbonate (RENVELA) 800 MG tablet Take 1 tablet (800 mg) by mouth 2 (two) times daily   Yes [provider]   torsemide (DEMADEX) 100 MG tablet Take 1 tablet (100 mg) by mouth daily   Yes [provider]   umeclidinium-vilanterol (Anoro Ellipta) 62.5-25 MCG/ACT Aerosol Pwdr, Breath Activated Inhale 1 puff into the lungs daily   Yes [provider]   Zolpidem Tartrate (AMBIEN PO) Take 5 mg by mouth as needed   Yes [provider]   b complex vitamins capsule Take 1 capsule by mouth daily    [provider]   cinacalcet (SENSIPAR) 30  MG tablet Take 1 tablet (30 mg) by mouth daily    [provider]   clopidogrel (PLAVIX) 75 MG tablet Take 1 tablet (75 mg) by mouth daily    [provider]   hyoscyamine (LEVSIN) 0.125 MG tablet Take 1 tablet (0.125 mg) by mouth every 4 (four) hours as needed for Cramping 06/02/22   Yousuf,  Munib A, MD       Allergies   Allergen Reactions    Metrizamide Shortness Of Breath and Nausea Only    Rosuvastatin Other (See Comments)     myopathy    myopathy   myopathy    myopathy myopathy      myopathy    myopathy, myopathy    Iodinated Contrast Media Other (See Comments)     Nausea and SOB per patient   On Dialysisi    Nausea and SOB per patient    Nausea and SOB per patient      Nausea and SOB per patient   Nausea and SOB per patient    Nausea and SOB per patient      Nausea and SOB per patient Nausea and SOB per patient      Nausea and SOB per patient Nausea and SOB per patient Nausea and SOB per patient      Nausea and SOB per patient  Nausea and SOB per patient Nausea and SOB per patient    Nausea and SOB per patient   Nausea and SOB per patient    Pioglitazone      Weight gain    Sulfa Antibiotics Hives     blister       Family History   Problem Relation Age of Onset    Cancer Mother     Heart failure Father     Cancer Maternal Aunt     Diabetes Maternal Aunt     Cancer Paternal Aunt        Social History     Tobacco Use    Smoking status: Former    Smokeless tobacco: Never   Advertising account planner    Vaping status: Never Used   Substance Use Topics    Alcohol use: No    Drug use: No       REVIEW OF SYSTEMS   Positive for: Unable to obtain  Negative for: Unable to obtain  All ROS completed and otherwise negative.    PHYSICAL EXAM     Vital Signs (most recent): BP 173/76   Pulse 83   Temp 98.3 F (36.8 C) (Axillary)   Resp 22   Ht 1.575 m (5\' 2" )   Wt 87.8 kg (193 lb 9 oz)   SpO2 96%   BMI 35.40 kg/m   Constitutional: No apparent distress. Patient speaks freely in full sentences.   HEENT: NC/AT, PERRL,  no scleral icterus or conjunctival pallor, no nasal discharge, MMM, oropharynx without erythema or exudate  Neck: trachea midline, supple, no cervical or supraclavicular lymphadenopathy or masses  Cardiovascular: RRR, normal S1 S2, no murmurs, gallops, palpable thrills, no JVD, Non-displaced PMI.  Respiratory: Normal rate. No retractions or increased work of breathing. Clear to auscultation and percussion bilaterally.  Gastrointestinal: +BS, non-distended, soft, non-tender, no rebound or guarding, no hepatosplenomegaly  Genitourinary: no suprapubic or costovertebral angle tenderness  Musculoskeletal: ROM and motor strength grossly normal. No clubbing, edema, or cyanosis. DP and radial pulses 2+ and symmetric.  Neurologic: EOMI, CN 2-12 grossly intact. no gross motor or sensory deficits  Psychiatric: Acutely delirious    LABS & IMAGING     Recent Results (from the past 24 hour(s))   Whole Blood Glucose POCT    Collection Time: 07/03/22  7:42 PM   Result Value Ref Range    Whole Blood Glucose POCT 215 (H) 70 - 100 mg/dL   Whole Blood Glucose POCT    Collection Time: 07/03/22  11:49 PM   Result Value Ref Range    Whole Blood Glucose POCT 178 (H) 70 - 100 mg/dL   Whole Blood Glucose POCT    Collection Time: 07/04/22  3:59 AM   Result Value Ref Range    Whole Blood Glucose POCT 169 (H) 70 - 100 mg/dL   CBC without Differential    Collection Time: 07/04/22  4:32 AM   Result Value Ref Range    WBC 19.25 (H) 3.10 - 9.50 x10 3/uL    Hemoglobin 8.0 (L) 11.4 - 14.8 g/dL    Hematocrit 16.1 (L) 34.7 - 43.7 %    Platelet Count 174 142 - 346 x10 3/uL    MPV 10.2 8.9 - 12.5 fL    RBC 2.63 (L) 3.90 - 5.10 x10 6/uL    MCV 92.0 78.0 - 96.0 fL    MCH 30.4 25.1 - 33.5 pg    MCHC 33.1 31.5 - 35.8 g/dL    RDW 15 11 - 15 %    nRBC % 0.2 (H) <=0.0 /100 WBC    Absolute nRBC 0.03 (H) <=0.00 x10 3/uL   Basic Metabolic Panel    Collection Time: 07/04/22  4:32 AM   Result Value Ref Range    Glucose 206 (H) 70 - 100 mg/dL    BUN 29 (H) 7 - 21  mg/dL    Creatinine 2.6 (H) 0.4 - 1.0 mg/dL    Calcium 7.9 7.9 - 09.6 mg/dL    Sodium 045 409 - 811 mEq/L    Potassium 3.9 3.5 - 5.3 mEq/L    Chloride 104 99 - 111 mEq/L    CO2 24 17 - 29 mEq/L    Anion Gap 13.0 5.0 - 15.0    GFR 17.5 (L) >=60.0 mL/min/1.73 m2   Magnesium    Collection Time: 07/04/22  4:32 AM   Result Value Ref Range    Magnesium 2.1 1.6 - 2.6 mg/dL   Phosphorus    Collection Time: 07/04/22  4:32 AM   Result Value Ref Range    Phosphorus 2.0 (L) 2.3 - 4.7 mg/dL   Calcium, Ionized    Collection Time: 07/04/22  4:32 AM   Result Value Ref Range    Calcium, Ionized 2.37 2.23 - 2.56 mEq/L   Whole Blood Glucose POCT    Collection Time: 07/04/22  7:45 AM   Result Value Ref Range    Whole Blood Glucose POCT 217 (H) 70 - 100 mg/dL   Whole Blood Glucose POCT    Collection Time: 07/04/22 11:40 AM   Result Value Ref Range    Whole Blood Glucose POCT 181 (H) 70 - 100 mg/dL   Whole Blood Glucose POCT    Collection Time: 07/04/22  3:54 PM   Result Value Ref Range    Whole Blood Glucose POCT 184 (H) 70 - 100 mg/dL       MICROBIOLOGY:  Blood Culture: None  Urine Culture: None    CARDIAC:  EKG Interpretation (upon my review): None    Markers:        EMERGENCY DEPARTMENT COURSE:  Orders Placed This Encounter   Procedures    CENTRAL LINE    CENTRAL LINE    Rectal Swab Carbapenemase Surveillance, PCR    Culture, Sputum and Lower Respiratory    XR OR MISCOUNT    XR Chest AP Portable    XR Chest AP Portable    XR Chest AP Portable    OUTSIDE LAB SCAN  Basic Metabolic Panel    Arterial Blood Gas    CBC without Differential    Basic Metabolic Panel    PT/INR    Phosphorus    CBC with Differential    PT/APTT    CBC with Differential    Arterial Blood Gas    Calcium, Ionized    CBC with Differential    Thromboelastography Clotting Profile    Lactic Acid    Basic Metabolic Panel    Phosphorus    Magnesium    CBC with Differential    Lactic Acid    Arterial Blood Gas    Lactic Acid    Basic Metabolic Panel    Magnesium     Phosphorus    CBC with Differential    Basic Metabolic Panel    Magnesium    Phosphorus    Magnesium    CBC with Differential    Type and Screen    CBC with Differential    Arterial Blood Gas    PT/INR    APTT    Arterial Blood Gas with Chemistries    CBC with Differential    CBC with Differential    CBC with Differential    CBC with Differential    CBC with Differential    CBC with Differential    Hepatic Function Panel (LFT)    CBC with Differential    CBC with Differential    CBC without Differential    Magnesium    Magnesium    Hepatitis B (HBV) Surface Antigen with Reflex to Confirmation    Basic Metabolic Panel    Magnesium    Phosphorus    Calcium, Ionized    Calcium, Ionized    Diet NPO effective now    Monitor per unit protocol    Notify physician    Height and weight on Admission    Weight    Critical Care Mobility protocol    Nursing communication    Place sequential compression device    Maintain sequential compression device    Vital signs (with SPO2)    Pain Assessment    Adult Hypoglycemia Treatment Algorithm    [place] Agitation- Sedation Scale (RASS)    Pain Assessment    Notify for positive CAM ICU    Nursing communication: Nursing to evaluate CAM ICU    Nursing communication: Nursing to Add VAP Protocol    Activity as tolerated    Mobility Protocol-ICU    Adult Hypoglycemia Treatment Algorithm    Target Glucose Goals    Nursing communication    NSG Communication: Glucose POCT order (PRN hypoglycemia)    Notify physician (Critical Blood Glucose Value)    Notify physician (Communication: Document Abnormal Blood Glucose)    POCT order (PRN hypoglycemia)    Adult Hypoglycemia Treatment Algorithm    Incentive spirometry nursing    RN remove line    Notify    Hemodialysis Interventions    Catheter care (fistula and graft)    NSG Communication: Glucose POCT order    RN remove line    Full Code    Consult Vas Access Team for Placement PEDS ONLY    Consult to Wound Ostomy Continence Nurse to Evaluate and  treat Reason for consult? Surgical Wound    Extubation    SLP eval and treat    ECG 12 lead    RBC O.R. HOLD Blood Product, 2 Units    Prepare / Crossmatch Red Blood Cells:  One Unit, 1  Units    Saline lock IV    Hemodialysis inpatient    Admit to Inpatient    Supervise For Meals Frequency: All meals    Restraints non-violent or non-self destructive    Restraints non-violent or non-self destructive       ASSESSMENT & PLAN     Shelby Barron is a 82 y.o. female admitted with Bladder cancer.    #S/P Cystectomy and completion nephrectomy  6/10 underwent cystectomy and nephrectomy complicated by small bowel enterotomy and hemorrhagic shock  Went to the OR on June 12 for small bowel anastomosis, closure of mesenteric defect, closure of abdominal wall with wound VAC application  - SLP eval tomorrow, if fails will start enteral feeding tomorrow  -Wound VAC changed 6/15    #Mixed shock - hemorrhagic vs septic: Resolved  Had EBL approximately 1 L in the OR on 6/10 also with small bowel enterotomy  Transfused 3 units PRBC in OR. Received another unit on 6/11 and 6/12  MAP greater than 65  Had TTE as part of preop cardiac clearance  Mild aortic stenosis (AVA 1.7), grade 1 diastolic dysfunction, normal LV/RV systolic function  Off nicardipine gtt, PRN IV labetalol for SBP > 180   -Continue cefepime/Flagyl per ID  -Appreciate ID recs    #History of CVA  #Encephalopathy/delirium  Hold home plavix  - Adhere to day/night cycles and administer constant reorientation.   - CAM ICU monitoring with early initiation of atypical antipsychotics as needed for control of agitated delirium.   - Out of bed to chair with PT/OT consult as indicated.   - CPOT < 3, IV Tylenol; avoid opioids     #Acute hypoxemic respiratory failure  #COPD  Extubated on 6/13, currently on 2L NC   Maintain SaO2 > 92%    #ESRD  iHD Tues/Thurs/Sat    #Renal cell carcinoma  #Urothelial carcinoma  #Thrombocytopenia  - VTE ppx: SCDs, subcutaneous heparin    #Diabetes  Mellitus type 2  -Sliding scale insulin                         Patient has BMI=Body mass index is 35.4 kg/m.  Diagnosis: Obesity based on BMI criteria         Recent Labs   Lab 07/04/22  0432 07/03/22  0347 07/02/22  0502   Hemoglobin 8.0* 7.6* 7.7*   Hematocrit 24.2* 22.7* 22.8*   MCV 92.0 91.2 90.5   WBC 19.25* 13.59* 14.81*   Platelet Count 174 129* 102*     Recent Labs   Lab 06/02/22  0514   Iron 62   TIBC 145*   Iron Saturation 43     Anemia Diagnosis: Unspecified Anemia (currently unable to determine type)    Nutrition: SLP evaluation pending    DVT/VTE Prophylaxis:   Current Facility-Administered Medications (Includes Only Anticoagulants, Misc. Hematological)   Medication Dose Route Last Admin    heparin (porcine) injection 5,000 Units  5,000 Units Subcutaneous 5,000 Units at 07/04/22 1610       Code Status: Full Code    Patient Class: INPATIENT. Inpatient status is judged to be reasonable and necessary in order to provide the required intensity of service to ensure the patient's safety. The patient's presenting symptoms, physical exam findings, and initial radiographic and laboratory data in the context of their chronic comorbidities is felt to place them at high risk for further clinical deterioration. Furthermore, it is not anticipated that the patient  will be medically stable for discharge from the hospital within 2 midnights of admission. The following factors support the admission status of inpatient: the patient's presenting symptoms of acute delirium, mixed shock, worrisome physical exam findings of acute delirium, worrisome laboratory data of anemia, and significant comorbidities including bladder cancer.    I certify that at the point of admission it is my clinical judgment that the patient will require inpatient hospital care spanning beyond 2 midnights from the point of admission due to high intensity of service, high risk for further deterioration and high frequency of surveillance required.      Anticipated medical stability for discharge: Greater than 48 Hours      Signed,  Dorothyann Gibbs, MD    07/04/2022 4:31 PM  Time Elapsed: 45 minutes

## 2022-07-04 NOTE — Plan of Care (Addendum)
[facility] 12/15 E4V2M6 doctor's aware. Has been having incomprehensible sounds from yesterday as handedover by previous nurse and according to the daughter. Obeying commands like squeezing hands  RASS 0  CAM ICU positive  CPOT 0  Looks comfortable sitting on the recliner  With moderate weakness on upper extremities and severe weakness on lower extremities  With non violent restraint on to keep the patient from removing life sustaining equipment. Indication for the use of non violent restraint was explained to the patient and family  Hypertensive  HR 90-100bpm- SR no ectopics  Warm peripheries, afebrile  CRT <3secs, no edema, palpable pulses  Intact pressure sores  @1136H - requested Dr Rosana Hoes to review the patient, ?oral thrush.   Dr Rosana Hoes updated the patient's daughter (as per request) regarding the patient's condition as according to the patient's daughter, patient was only having incomprehensible sounds since yesterday.   @1700H - requested Dr Rosana Hoes to review the patient as the SBP is 181 and  prn labetatol is not due yet and patient looks drowsy. However, after at 1720H- SBP is 179, so no need for prn. And patient became more awake, with spontaneous eye opening during transfer from chair to bed. Pupils equal size and reactive to light. Dr Rosana Hoes aware.  @1832H - SBP 188, another dose of labetalol IV was given  @1900H - updated the patient's daughter about the plan for transfer    Problem: Moderate/High Fall Risk Score >5  Goal: Patient will remain free of falls  Outcome: Not Progressing  Flowsheets (Taken 07/04/2022 0800)  Moderate Risk (6-13): MOD-Apply bed exit alarm if patient is confused  High (Greater than 13): HIGH-Bed alarm on at all times while patient in bed     Problem: Non-Violent Restraints Interdisciplinary Plan  Goal: Will be injury free during the use of non-violent restraints  Outcome: Not Progressing  Flowsheets (Taken 07/04/2022 0312 by Elwyn Reach, RN)  Will be injury free during the use of non-violent  restraints:   Attempt all alternatives before use of restraints   Initiate least restrictive type of restraint that is effective   Provide and maintain safe environment   Notify family of initiation of restraints   Include patient/family/caregiver in decisions related to safety   Ensure safety devices are properly applied and maintained   Document observed patient actions according to protocol   Remove restraints before the indicated maximum length of time when meets criteria for discontinuation   Nurse to accompany patient off unit when on restraints   Document significant changes in patient condition   Provide debriefing as soon as possible and appropriate   Reassess need for continued restraints   Ensure that order for restraints has not expired     Problem: Compromised Sensory Perception  Goal: Sensory Perception Interventions  Outcome: Not Progressing  Flowsheets (Taken 07/04/2022 0800)  Sensory Perception Interventions: Offload heels, Pad bony prominences, Reposition q 2hrs/turn Clock, Q2 hour skin assessment under devices if present     Problem: Compromised Moisture  Goal: Moisture level Interventions  Outcome: Not Progressing  Flowsheets (Taken 07/04/2022 0800)  Moisture level Interventions: Moisture wicking products, Moisture barrier cream     Problem: Compromised Activity/Mobility  Goal: Activity/Mobility Interventions  Outcome: Not Progressing  Flowsheets (Taken 07/04/2022 0800)  Activity/Mobility Interventions: Pad bony prominences, TAP Seated positioning system when OOB, Promote PMP, Reposition q 2 hrs / turn clock, Offload heels     Problem: Compromised Nutrition  Goal: Nutrition Interventions  Outcome: Not Progressing  Flowsheets (Taken 07/04/2022 0800)  Nutrition Interventions:  Discuss nutrition at Rounds, I&Os, Document % meal eaten, Daily weights     Problem: Compromised Friction/Shear  Goal: Friction and Shear Interventions  Outcome: Not Progressing  Flowsheets (Taken 07/04/2022 0800)  Friction and  Shear Interventions: Pad bony prominences, Off load heels, HOB 30 degrees or less unless contraindicated, Consider: TAP seated positioning, Heel foams     Problem: SCIP  Goal: SCIP measures are followed  Outcome: Not Progressing  Flowsheets (Taken 06/29/2022 1023 by Luz Brazen, RN)  SCIP measures are followed:   Administer antibiotics as ordered   VTE Prevention: Administer anticoagulant(s) and/or apply anti-embolism stockings/devices as ordered     Problem: Inadequate Airway Clearance  Goal: Patent Airway maintained  Outcome: Not Progressing  Flowsheets (Taken 07/04/2022 0312 by Elwyn Reach, RN)  Patent airway maintained:   Position patient for maximum ventilatory efficiency   Provide adequate fluid intake to liquefy secretions   Suction secretions as needed   Reinforce use of ordered respiratory interventions (i.e. CPAP, BiPAP, Incentive Spirometer, Acapella, etc.)   Reposition patient every 2 hours and as needed unless able to self-reposition  Goal: Normal respiratory rate/effort achieved/maintained  Outcome: Not Progressing  Flowsheets (Taken 07/04/2022 0312 by Elwyn Reach, RN)  Normal respiratory rate/effort achieved/maintained: Plan activities to conserve energy: plan rest periods     Problem: Infection Prevention  Goal: Free from infection  Outcome: Not Progressing  Flowsheets (Taken 07/04/2022 0312 by Elwyn Reach, RN)  Free from infection:   Monitor/assess vital signs   Encourage/assist patient to turn, cough and perform deep breathing every 2 hours   Assess incision for evidence of healing   Monitor/assess output from surgical drain if present   Assess for signs and symptoms of infection   Monitor/assess lab values and report abnormal values     Problem: Impaired Mobility  Goal: Mobility/Activity is maintained at optimal level for patient  Outcome: Not Progressing  Flowsheets (Taken 07/04/2022 0312 by Elwyn Reach, RN)  Mobility/activity is maintained at optimal level for patient:   Encourage  independent activity per ability   Consult/collaborate with Physical Therapy and/or Occupational Therapy     Problem: Constipation  Goal: Elimination patterns are normal or improving  Outcome: Not Progressing     Problem: Safety  Goal: Patient will be free from injury during hospitalization  Outcome: Not Progressing  Goal: Patient will be free from infection during hospitalization  Outcome: Not Progressing     Problem: Pain  Goal: Pain at adequate level as identified by patient  Outcome: Not Progressing  Flowsheets (Taken 07/04/2022 0312 by Elwyn Reach, RN)  Pain at adequate level as identified by patient:   Identify patient comfort function goal   Assess for risk of opioid induced respiratory depression, including snoring/sleep apnea. Alert healthcare team of risk factors identified.   Assess pain on admission, during daily assessment and/or before any "as needed" intervention(s)   Reassess pain within 30-60 minutes of any procedure/intervention, per Pain Assessment, Intervention, Reassessment (AIR) Cycle   Evaluate if patient comfort function goal is met   Evaluate patient's satisfaction with pain management progress   Offer non-pharmacological pain management interventions     Problem: Side Effects from Pain Analgesia  Goal: Patient will experience minimal side effects of analgesic therapy  Outcome: Not Progressing     Problem: Hemodynamic Status: Cardiac  Goal: Stable vital signs and fluid balance  Outcome: Not Progressing  Flowsheets (Taken 07/04/2022 0312 by Elwyn Reach, RN)  Stable vital signs and fluid balance:   Assess signs and  symptoms associated with cardiac rhythm changes   Monitor lab values     Problem: Renal Instability  Goal: Fluid and electrolyte balance are achieved/maintained  Outcome: Not Progressing  Flowsheets (Taken 07/04/2022 0312 by Elwyn Reach, RN)  Fluid and electrolyte balance are achieved/maintained:   Monitor/assess lab values and report abnormal values   Assess and reassess  fluid and electrolyte status   Observe for cardiac arrhythmias   Follow fluid restrictions/IV/PO parameters     Problem: Patient Receiving Advanced Renal Therapies  Goal: Therapy access site remains intact  Outcome: Not Progressing     Problem: Fluid and Electrolyte Imbalance/ Endocrine  Goal: Adequate hydration  Outcome: Not Progressing     Problem: Diabetes: Glucose Imbalance  Goal: Blood glucose stable at established goal  Outcome: Not Progressing     Problem: Inadequate Gas Exchange  Goal: Patent Airway maintained  Outcome: Not Progressing  Flowsheets (Taken 07/04/2022 0312 by Elwyn Reach, RN)  Patent airway maintained:   Position patient for maximum ventilatory efficiency   Provide adequate fluid intake to liquefy secretions   Suction secretions as needed   Reinforce use of ordered respiratory interventions (i.e. CPAP, BiPAP, Incentive Spirometer, Acapella, etc.)   Reposition patient every 2 hours and as needed unless able to self-reposition  Goal: Adequate oxygenation and improved ventilation  Outcome: Not Progressing

## 2022-07-04 NOTE — Progress Notes (Signed)
[facility] PROGRESS NOTE      Date of Note: 07/04/2022   Patient Name: Shelby Barron     Date of Birth:  07-Sep-1940     MRN:               [id number]     PCP:               Patsy Lager, MD     PLAN     Continue supportive care, appreciate ICU input    I will discuss/discussed the plan of care with the following services:  General surgery    I spent a total of 10 minutes involved in this patient's care    Neldon Newport, MD  07/04/2022, 1:18 PM    SUBJECTIVE     Interval History: Postop day #6 status post radical cystectomy and bilateral nephrectomy with enterotomy  Postop day #4 status post abdominal wound closure, bowel anastamosis,  Continues to improve medically, but still not very communicative.  Nutrition planning.  [name]      Patient Active Problem List    Diagnosis Date Noted    Anemia 06/30/2022    COPD (chronic obstructive pulmonary disease) 06/30/2022    Bladder cancer 06/28/2022    Malignant neoplasm of urinary bladder, unspecified site 06/25/2022    Malignant tumor of kidney, left 06/25/2022    GERD (gastroesophageal reflux disease) 06/25/2022    Leukocytosis 06/25/2022    Chronic diastolic congestive heart failure 06/23/2022    Aortic atherosclerosis 06/23/2022    Peritonitis 05/26/2022    ESRD (end stage renal disease) 05/26/2022    Hamstring injury 05/10/2013    Benign essential hypertension 02/10/2013    Left renal mass 11/28/2012    Type 2 diabetes mellitus with kidney complication, with long-term current use of insulin 07/31/2010    Diabetes mellitus type II, uncontrolled 07/31/2010    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        OBJECTIVE     Vital Signs: BP 174/76   Pulse 91   Temp 98.7 F (37.1 C) (Axillary)   Resp 18   Ht 1.575 m (5\' 2" )   Wt 87.8 kg (193 lb 9 oz)   SpO2 96%   BMI 35.40 kg/m     TMax: Temp (24hrs), Avg:98.7 F (37.1 C), Min:97.3 F (36.3 C), Max:99.9 F (37.7  C)    I/Os:   Intake/Output Summary (Last 24 hours) at 07/04/2022 1318  Last data filed at 07/04/2022 1000  Gross per 24 hour   Intake 656.67 ml   Output 0 ml   Net 656.67 ml             LABS & IMAGING     CBC  Recent Labs     07/04/22  0432 07/03/22  0347 07/02/22  0502   WBC 19.25* 13.59* 14.81*   RBC 2.63* 2.49* 2.52*   Hematocrit 24.2* 22.7* 22.8*   MCV 92.0 91.2 90.5   MCH 30.4 30.5 30.6   MCHC 33.1 33.5 33.8   RDW 15 15 15    MPV 10.2 10.5 10.9       BMP  Recent Labs     07/04/22  0432 07/03/22  1412 07/03/22  0347   CO2 24 25 23    Anion Gap 13.0 9.0 7.0   BUN 29* 15 23*   Creatinine 2.6* 1.4* 1.9*       INR  Lab Results   Component Value Date/Time    INR 1.1 07/01/2022 11:20 AM    INR 1.1 06/01/2022 03:15 AM         IMAGING:  XR Chest AP Portable   Final Result         1. Lines and tubes in adequate position. No pneumothorax.   2. Trace left pleural effusion and bibasilar atelectasis.      Jasmine December D'Heureux, MD   06/30/2022 4:53 PM      XR Chest AP Portable   Final Result      Catheters as described.      Pankaj Dominica, MD   06/30/2022 2:07 PM      XR Chest AP Portable   Final Result    Right mainstem bronchial intubation. The tube should be   withdrawn about 4 cm.      Dr. Tildon Husky is aware.      Wynema Birch, MD   06/28/2022 4:56 PM      XR OR MISCOUNT   Final Result    No unexpected foreign body.      These critical results were discussed with and acknowledged by Angie R.,   the circulating nurse, who will communicate the findings to Everardo All,   MD on 06/28/2022 3:02 PM.         Bosie Helper, MD   06/28/2022 3:03 PM

## 2022-07-04 NOTE — Progress Notes (Signed)
GENERAL SURGERY  PROGRESS NOTE    Date Time: 07/04/22 1:23 PM  Patient Name: Shelby Barron And Shelby Barron Day: 6    ASSESSMENT:     Patient Active Problem List   Diagnosis    Type 2 diabetes mellitus with kidney complication, with long-term current use of insulin    Diabetes mellitus type II, uncontrolled    Neuropathy of hand    Left renal mass    Hamstring injury    Peritonitis    ESRD (end stage renal disease)    Chronic diastolic congestive heart failure    Aortic atherosclerosis    Malignant neoplasm of urinary bladder, unspecified site    Malignant tumor of kidney, left    Benign essential hypertension    GERD (gastroesophageal reflux disease)    Leukocytosis    Bladder cancer    Anemia    COPD (chronic obstructive pulmonary disease)       Shelby Barron is a 82 y.o. female with bladder cancer and renal failure admitted for cystectomy with bilateral nephrectomy complicated by enterotomy requiring small bowel resection in discontinuity and negative pressure temporary abdominal closure given pressure support and need for resuscitation. Returned to the OR 6/12 for re-anastomosis and abdominal closure.  SLP with recommendation for n.p.o.  Concern for vaginal discharge, discussed with neurology as anticipated given partial vaginectomy.  Patient likely has ileus at this time we will continue to monitor, if patient has episode of severe nausea or emesis replace NG tube.  Patient with increased leukocytosis (19 from 13, although afebrile , will continue to monitor for now, if worsens will consider CT abdomen pelvis with p.o. contrast to evaluate for anastomotic    PLAN:   - NPO, mIVF, once no longer on intermittent pressors will consider advancing diet  -Wound care ostomy nursing consultation  - Pain control   -Recommend TPN   - Transfuse PRN for anemia  -Wound VAC  - Abx: zosyn per ID  - Remainder of care per primary team     SUBJECTIVE:   NAOE    Abdominal pin tolerable, -nausea, - emesis, - flatus, - BM.   Patient report mentation.     Objective:   Current Vitals:   Vitals:    07/04/22 1200   BP: 174/76   Pulse: 91   Resp: 18   Temp: 98.7 F (37.1 C)   SpO2: 96%       Intake and Output Summary (Last 24 hours):  I/O last 3 completed shifts:  In: 1724.97 [I.V.:375.3; IV Piggyback:321.67]  Out: 1875 [Other:1875]    Labs:     Results       Procedure Component Value Units Date/Time    Culture, Sputum and Lower Respiratory [[id number]] Collected: 07/04/22 1034    Specimen: Sputum, Suctioned Updated: 07/04/22 1039    Magnesium [[id number]]  (Normal) Collected: 07/04/22 0432    Specimen: Blood, Venous Updated: 07/04/22 0519     Magnesium 2.1 mg/dL     Phosphorus [[id number]]  (Abnormal) Collected: 07/04/22 0432    Specimen: Blood, Venous Updated: 07/04/22 0519     Phosphorus 2.0 mg/dL     Basic Metabolic Panel [[id number]]  (Abnormal) Collected: 07/04/22 0432    Specimen: Blood, Venous Updated: 07/04/22 0519     Glucose 206 mg/dL      BUN 29 mg/dL      Creatinine 2.6 mg/dL      Calcium 7.9 mg/dL      Sodium 440 mEq/L  Potassium 3.9 mEq/L      Chloride 104 mEq/L      CO2 24 mEq/L      Anion Gap 13.0     GFR 17.5 mL/min/1.73 m2     CBC without Differential [[id number]]  (Abnormal) Collected: 07/04/22 0432    Specimen: Blood, Venous Updated: 07/04/22 0450     WBC 19.25 x10 3/uL      Hemoglobin 8.0 g/dL      Hematocrit 24.4 %      Platelet Count 174 x10 3/uL      MPV 10.2 fL      RBC 2.63 x10 6/uL      MCV 92.0 fL      MCH 30.4 pg      MCHC 33.1 g/dL      RDW 15 %      nRBC % 0.2 /100 WBC      Absolute nRBC 0.03 x10 3/uL     Calcium, Ionized [[id number]]  (Normal) Collected: 07/04/22 0432    Specimen: Blood, Venous Updated: 07/04/22 0447     Calcium, Ionized 2.37 mEq/L     Hepatitis B (HBV) Surface Antigen with Reflex to Confirmation [[id number]]  (Normal) Collected: 07/03/22 0951    Specimen: Blood, Venous Updated: 07/03/22 1635     Hepatitis B Surface Antigen Non-Reactive    Basic Metabolic Panel [[id number]]  (Abnormal)  Collected: 07/03/22 1412    Specimen: Blood, Venous Updated: 07/03/22 1507     Glucose 269 mg/dL      BUN 15 mg/dL      Creatinine 1.4 mg/dL      Calcium 8.3 mg/dL      Sodium 433 mEq/L      Potassium 3.5 mEq/L      Chloride 103 mEq/L      CO2 25 mEq/L      Anion Gap 9.0     GFR 36.9 mL/min/1.73 m2     Magnesium [[id number]]  (Normal) Collected: 07/03/22 1412    Specimen: Blood, Venous Updated: 07/03/22 1507     Magnesium 1.9 mg/dL     Phosphorus [[id number]]  (Abnormal) Collected: 07/03/22 1412    Specimen: Blood, Venous Updated: 07/03/22 1507     Phosphorus 1.6 mg/dL     Calcium, Ionized [[id number]]  (Normal) Collected: 07/03/22 1412    Specimen: Blood, Intravenous Line Updated: 07/03/22 1430     Calcium, Ionized 2.38 mEq/L             Rads:     Radiology Results (24 Hour)       ** No results found for the last 24 hours. **              Medications:     Current Facility-Administered Medications   Medication Dose Route Frequency    cefepime  1 g Intravenous Q24H    heparin (porcine)  5,000 Units Subcutaneous Q12H SCH    insulin lispro  2-10 Units Subcutaneous Q4H SCH    metroNIDAZOLE  500 mg Intravenous Q8H    thiamine  100 mg Intravenous Q24H SCH         benzocaine, dextrose **OR** dextrose **OR** dextrose **OR** glucagon (rDNA), labetalol, naloxone     Physical Exam:     Physical Exam  Vitals reviewed.   Constitutional:       General: She is not in acute distress.     Appearance: She is not ill-appearing, toxic-appearing or diaphoretic.   Eyes:      General: No scleral icterus.  Cardiovascular:      Rate and Rhythm: Normal rate and regular rhythm.      Pulses: Normal pulses.   Pulmonary:      Effort: Pulmonary effort is normal.      Comments: On RA  Abdominal:      General: There is no distension.      Palpations: Abdomen is soft.      Tenderness: There is no abdominal tenderness.      Comments: Wound vac in place holding suction, serosanguinous output in canister    No bowel sounds   Genitourinary:     Comments:  Undergoing CRRT  Skin:     General: Skin is warm.      Coloration: Skin is not jaundiced.   Neurological:      Mental Status: She is disoriented.

## 2022-07-04 NOTE — SLP Eval Note (Signed)
[facility]  Speech and Language Therapy Bedside Swallow Evaluation     Patient:  Shelby Barron MRN#:  [id number]  Unit:  MED SURG ICU 1 Room/Bed:  A2910/A2910-01    Time of Treatment:   Time Calculation  SLP Received On: 07/04/22  Start Time: 0826  Stop Time: 0841  Time Calculation (min): 15 min  SLP Cancellation: Order  SLP Order Cancellation Reason: Provider/Medical Hold (comment required) - Provider/RN decision    Consult received for Shelby Barron for SLP Bedside Swallow Evaluation and Treatment.    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 female admitted on 06/28/2022  who presented with history of PD, CVA, type 2 diabetes presented for elective cystectomy and completion nephrectomy. OR complicated by small bowel enterotomy and hemorrhagic shock.   Patient Active Problem List   Diagnosis    Type 2 diabetes mellitus with kidney complication, with long-term current use of insulin    Diabetes mellitus type II, uncontrolled    Neuropathy of hand    Left renal mass    Hamstring injury    Peritonitis    ESRD (end stage renal disease)    Chronic diastolic congestive heart failure    Aortic atherosclerosis    Malignant neoplasm of urinary bladder, unspecified site    Malignant tumor of kidney, left    Benign essential hypertension    GERD (gastroesophageal reflux disease)    Leukocytosis    Bladder cancer    Anemia    COPD (chronic obstructive pulmonary disease)        Past Medical/Surgical History:  Past Medical History:   Diagnosis Date    Acid reflux     Asthma, well controlled     Cancer of kidney     Chronic lower back pain     Congestive heart disease     COPD (chronic obstructive pulmonary disease)     CVA (cerebral infarction) 01/12/2003    Diabetes     Diabetic nephropathy     Fibroids     Gout     H/O: gout     Hyperlipidemia      Hypertension     Neuropathy of hand     SOB (shortness of breath)       Past Surgical History:   Procedure Laterality Date    APPENDECTOMY (OPEN)      CLOSURE, ENTEROTOMY, SMALL INTESTINE  06/28/2022    Procedure: CLOSURE, ENTEROTOMY, SMALL INTESTINE;  Surgeon: Marlaine Hind, MD;  Location: [facility];  Service: General;;    CYSTECTOMY, ILEOCONDUIT N/A 06/28/2022    Procedure: CYSTECTOMY, RADICAL;  Surgeon: Neldon Newport, MD;  Location: [facility];  Service: Urology;  Laterality: N/A;    DRAIN (OTHER) N/A 05/31/2022    Procedure: DRAIN (OTHER);  Surgeon: Hope Pigeon, MD;  Location: [facility];  Service: Interventional Radiology;  Laterality: N/A;    EXPLORATORY LAPAROTOMY N/A 06/30/2022    Procedure: SMALL BOWEL ANASTOMOSIS, CLOSURE OF MESENTERIC DEFECT, CLOSURE OF ABDOMINAL WALL;  Surgeon: Marlaine Hind, MD;  Location: [facility];  Service: General;  Laterality: N/A;  **[id number]/ MD AVAIL [id number] OR AFTER 1600**    EXPLORATORY LAPAROTOMY, RESECTION SMALL BOWEL N/A 06/28/2022    Procedure: RESECTION SMALL BOWEL;  Surgeon: Marlaine Hind, MD;  Location: [facility];  Service: General;  Laterality:  N/A;    HERNIA REPAIR      HYSTERECTOMY      LAPAROTOMY, NEPHRECTOMY Bilateral 06/28/2022    Procedure: BILATERAL OPEN RADICAL NEPHRECTOMY AND LYSIS OF ASHESION;  Surgeon: Neldon Newport, MD;  Location: [facility];  Service: Urology;  Laterality: Bilateral;    PARTIAL NEPHRECTOMY      tumor removed from kidney  cJanuary 2015    TUNNELED CATH PLACEMENT (PERMCATH) N/A 06/01/2022    Procedure: TUNNELED CATH PLACEMENT;  Surgeon: Suszanne Finch, MD;  Location: [facility];  Service: Interventional Radiology;  Laterality: N/A;    TURBT      WOUND VAC APPLICATION N/A 06/28/2022    Procedure: Tawni Pummel WOUND VAC APPLICATION;  Surgeon: Marlaine Hind, MD;  [facility]: [facility];  Service: General;  Laterality: N/A;    WOUND VAC APPLICATION N/A 06/30/2022    Procedure: WOUND VAC APPLICATION;  Surgeon: Marlaine Hind,  MD;  Location: [facility];  Service: General;  Laterality: N/A;         History/Current Status:  Current Status  Respiratory Status: O2 via nasal cannula    Subjective: Patient is agreeable to participation in the therapy session. Patient's medical condition is appropriate for Speech therapy intervention at this time.      Objective:  Observation of Patient/Vital Signs:  Patient is in bed with restraints and telemetry in place.    Oral Motor Skills:  Engineer, maintenance (IT) Skills: unable to assess    Deglutition Skills:  Deglutition Skills  Position: upright 90 degrees  Food(s) Tested: Ice chips, Thin Liquids (IDDSI TN0)  Oral Stage: bolus formation/control reduced, ineffective, suspect impaired swallow initiation  Pharyngeal Stage: immediate weak cough    Assessment:   A swallow evaluation order was acknowledged and completed. Patient was seen sitting upright in bed on NC. Patient was awake/alert, but did not follow commands. Patient would respond to her name (possibly to voice). Unable to completed OME. Patient presented with ice chips and thin liquids via reverse straw. With ice chips, patient immediately closed oral cavity which appeared to have attempt to manipulate but then expectorated. With thin liquids via reverse straw, patient closed lips to withdrawal from straw,however, trial remained in oral cavity despite cueing. Trial eventually expectorated. Difficult to determine if patient initiated a swallow.Trials terminated. Although, patient was awake/alert, current mentation is impacting presentation. NPO with alternative means of nutrition and hydration is recommended. Patient to be placed on caseload. Re-assessment to be completed x1 day. Impressions reviewed with medical team.      Goals:  Will re-assess for readiness for diet initiation.       Plan/Recommendations:  Diet Solids Recommendation: NPO except ice chips  Oral care 2-3x daily  Re-assessment    SLP Frequency Recommended:  4-5x/wk  Administration of Medications: Via alternative means   Aspiration Precautions posted at bedside: Discussion completed with RN.     07/04/2022  Madie Reno, CCC-SLP  [phone]

## 2022-07-04 NOTE — Progress Notes (Signed)
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 s/p bilateral radical nephrectomies and  cystectomy on 06/28/22  - complicated by small bowel enterotomy and blood loss - patient left in discontinuity   Hypotension - resolved, now hypertensive   High grade urothelial bladder CA: s/p bilateral nephrectomies and cystectomy c/b mall bowel enterotomy and blood loss (~1L) on 06/28/22  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 (approx 1L) - stable  Hypophosphatemia   Encephalopathy    Recommendations:   Will need eventual permcath exchange this week  Next HD Tuesday 6/18  Follow phos levels  Start norvasc if BP remains high     Case discussed with: daughter      Debara Pickett, MD, MD  [facility]  [phone] (office)  X [phone] [facility] Spectra-Link)      Subjective:  s/p HD yesterday 1.8L, tolerated well, on RA  hypertensive    Review of Systems:   Unable to assess, patient is confused     Physical Exam:     Vitals:    07/04/22 0500 07/04/22 0600 07/04/22 0700 07/04/22 0832   BP: 154/69 152/69 162/72 160/74   Pulse: (!) 106 95 97    Resp: (!) 26 (!) 26 (!) 28    Temp:       TempSrc:       SpO2: 97% 97% 97%    Weight:       Height:           Intake and Output Summary (Last 24 hours) at Date Time    Intake/Output Summary (Last 24 hours) at 07/04/2022 0848  Last data filed at 07/04/2022 0600  Gross per 24 hour   Intake 852.7 ml   Output 1800 ml   Net -947.3 ml       General: confused  Cardiovascular: regular rate and rhythm  Lungs: bilateral air entry  Abdomen:  soft  Extremities: edema noted in her hands    Access: TDC    Meds:      Scheduled Meds: PRN Meds:    cefepime, 1 g, Intravenous, Q24H  heparin (porcine), 5,000 Units, Subcutaneous, Q12H SCH  insulin lispro, 2-10 Units, Subcutaneous, Q4H SCH  metroNIDAZOLE, 500 mg, Intravenous, Q8H  thiamine, 100 mg, Oral, Daily          Continuous Infusions:     benzocaine, 1 spray, TID PRN  dextrose, 15 g of glucose, PRN   Or  dextrose, 12.5 g, PRN   Or  dextrose, 12.5 g, PRN   Or  glucagon (rDNA), 1 mg, PRN  labetalol, 10 mg, Q2H PRN  naloxone, 0.2 mg, PRN              Labs:     Recent Labs   Lab 07/04/22  0432 07/03/22  0347 07/02/22  0502   WBC 19.25* 13.59* 14.81*   Hemoglobin 8.0* 7.6* 7.7*   Hematocrit 24.2* 22.7* 22.8*   Platelet Count 174 129* 102*  Recent Labs   Lab 07/04/22  0432 07/03/22  1412 07/03/22  0347 07/02/22  1749 07/02/22  1122   Sodium 141 137 136 137 138   Potassium 3.9 3.5 4.3 4.5 4.4   Chloride 104 103 106 105 105   CO2 24 25 23 26 26    BUN 29* 15 23* 13 14   Creatinine 2.6* 1.4* 1.9* 1.2* 1.2*   Calcium 7.9 8.3 10.4* 10.6* 10.3*   Albumin  --   --  1.4*  1.4* 1.4* 1.4*   Phosphorus 2.0* 1.6* 1.3* 1.4* 1.1*   Magnesium 2.1 1.9 2.4  --   --    Glucose 206* 269* 238* 196* 197*   GFR 17.5* 36.9* 25.6* 44.4* 44.4*           Signed by: Debara Pickett, MD, MD

## 2022-07-04 NOTE — Treatment Plan (Cosign Needed)
4 eyes in 4 hours pressure injury assessment note:      Completed with:   Unit & Time admitted:              Bony Prominences: Check appropriate box; if wound is present enter wound assessment in LDA     Occiput:                 [x]WNL  [] Wound present  Face:                     [x]WNL  [] Wound present  Ears:                      [x]WNL  [] Wound present  Spine:                    [x]WNL  [] Wound present  Shoulders:             [x]WNL  [] Wound present  Elbows:                  [x]WNL  [] Wound present  Sacrum/coccyx:     [x]WNL  [] Wound present  Ischial Tuberosity:  [x]WNL  [] Wound present  Trochanter/Hip:      [x]WNL  [] Wound present  Knees:                   [x]WNL  [] Wound present  Ankles:                   [x]WNL  [] Wound present  Heels:                    [x]WNL  [] Wound present  Other pressure areas:  [] Wound location       Device related: [] Device name:         LDA completed if wound present: yes/no  Consult WOCN if necessary    Other skin related issues, ie tears, rash, etc, document in Integumentary flowsheet

## 2022-07-05 ENCOUNTER — Other Ambulatory Visit: Payer: Self-pay

## 2022-07-05 ENCOUNTER — Inpatient Hospital Stay: Payer: No Typology Code available for payment source

## 2022-07-05 LAB — WHOLE BLOOD GLUCOSE POCT
Whole Blood Glucose POCT: 160 mg/dL — ABNORMAL HIGH (ref 70–100)
Whole Blood Glucose POCT: 195 mg/dL — ABNORMAL HIGH (ref 70–100)
Whole Blood Glucose POCT: 207 mg/dL — ABNORMAL HIGH (ref 70–100)
Whole Blood Glucose POCT: 208 mg/dL — ABNORMAL HIGH (ref 70–100)
Whole Blood Glucose POCT: 209 mg/dL — ABNORMAL HIGH (ref 70–100)
Whole Blood Glucose POCT: 215 mg/dL — ABNORMAL HIGH (ref 70–100)

## 2022-07-05 LAB — BASIC METABOLIC PANEL
Anion Gap: 14 (ref 5.0–15.0)
BUN: 50 mg/dL — ABNORMAL HIGH (ref 7–21)
CO2: 23 mEq/L (ref 17–29)
Calcium: 7.6 mg/dL — ABNORMAL LOW (ref 7.9–10.2)
Chloride: 106 mEq/L (ref 99–111)
Creatinine: 4 mg/dL — ABNORMAL HIGH (ref 0.4–1.0)
GFR: 10.5 mL/min/{1.73_m2} — ABNORMAL LOW (ref >=60.0)
Glucose: 200 mg/dL — ABNORMAL HIGH (ref 70–100)
Potassium: 4.4 mEq/L (ref 3.5–5.3)
Sodium: 143 mEq/L (ref 135–145)

## 2022-07-05 LAB — CBC
Absolute nRBC: 0.04 10*3/uL — ABNORMAL HIGH (ref <=0.00)
Hematocrit: 24.4 % — ABNORMAL LOW (ref 34.7–43.7)
Hemoglobin: 7.9 g/dL — ABNORMAL LOW (ref 11.4–14.8)
MCH: 30 pg (ref 25.1–33.5)
MCHC: 32.4 g/dL (ref 31.5–35.8)
MCV: 92.8 fL (ref 78.0–96.0)
MPV: 10 fL (ref 8.9–12.5)
Platelet Count: 213 10*3/uL (ref 142–346)
RBC: 2.63 10*6/uL — ABNORMAL LOW (ref 3.90–5.10)
RDW: 15 % (ref 11–15)
WBC: 22.46 10*3/uL — ABNORMAL HIGH (ref 3.10–9.50)
nRBC %: 0.2 /100 WBC — ABNORMAL HIGH (ref <=0.0)

## 2022-07-05 LAB — CALCIUM, IONIZED: Calcium, Ionized: 2.32 mEq/L (ref 2.23–2.56)

## 2022-07-05 LAB — MAGNESIUM: Magnesium: 2.2 mg/dL (ref 1.6–2.6)

## 2022-07-05 LAB — PHOSPHORUS: Phosphorus: 3.4 mg/dL (ref 2.3–4.7)

## 2022-07-05 MED ORDER — LIDOCAINE HCL URETHRAL/MUCOSAL 2 % EX PRSY
PREFILLED_SYRINGE | Freq: Once | CUTANEOUS | Status: DC | PRN
Start: 2022-07-05 — End: 2022-08-18
  Filled 2022-07-05: qty 6

## 2022-07-05 MED ORDER — ACETAMINOPHEN 325 MG PO TABS
650.0000 mg | ORAL_TABLET | Freq: Four times a day (QID) | ORAL | Status: DC | PRN
Start: 2022-07-05 — End: 2022-07-06
  Administered 2022-07-05 – 2022-07-06 (×3): 650 mg via ORAL
  Filled 2022-07-05 (×3): qty 2

## 2022-07-05 MED ORDER — PHENOL 1.4 % MT LIQD
1.0000 | OROMUCOSAL | Status: DC | PRN
Start: 2022-07-05 — End: 2022-08-18
  Administered 2022-07-19 – 2022-07-22 (×6): 1 via ORAL
  Filled 2022-07-05 (×2): qty 177

## 2022-07-05 MED ORDER — DIPHENHYDRAMINE HCL 25 MG PO CAPS
50.0000 mg | ORAL_CAPSULE | Freq: Once | ORAL | Status: DC
Start: 2022-07-05 — End: 2022-07-06

## 2022-07-05 MED ORDER — PREDNISONE 20 MG PO TABS
50.0000 mg | ORAL_TABLET | Freq: Once | ORAL | Status: AC
Start: 2022-07-05 — End: 2022-07-05
  Administered 2022-07-05: 50 mg via ORAL

## 2022-07-05 MED ORDER — AMLODIPINE BESYLATE 5 MG PO TABS
5.0000 mg | ORAL_TABLET | Freq: Every day | ORAL | Status: DC
Start: 2022-07-05 — End: 2022-07-06
  Administered 2022-07-05: 5 mg via ORAL
  Filled 2022-07-05: qty 1

## 2022-07-05 MED ORDER — DIPHENHYDRAMINE HCL 50 MG/ML IJ SOLN
25.0000 mg | Freq: Four times a day (QID) | INTRAMUSCULAR | Status: DC | PRN
Start: 2022-07-05 — End: 2022-08-18
  Administered 2022-08-15: 25 mg via INTRAVENOUS
  Filled 2022-07-05: qty 1

## 2022-07-05 MED ORDER — PIPERACILLIN-TAZOBACTAM IN DEX 2-0.25 GM/50ML IV SOLN
2.2500 g | Freq: Three times a day (TID) | INTRAVENOUS | Status: DC
Start: 2022-07-05 — End: 2022-07-06
  Administered 2022-07-05 – 2022-07-06 (×3): 2.25 g via INTRAVENOUS
  Filled 2022-07-05 (×3): qty 50

## 2022-07-05 MED ORDER — PREDNISONE 20 MG PO TABS
50.0000 mg | ORAL_TABLET | Freq: Once | ORAL | Status: DC
Start: 2022-07-05 — End: 2022-07-06

## 2022-07-05 MED ORDER — PREDNISONE 20 MG PO TABS
50.0000 mg | ORAL_TABLET | Freq: Once | ORAL | Status: AC
Start: 2022-07-05 — End: 2022-07-05
  Administered 2022-07-05: 50 mg via ORAL
  Filled 2022-07-05 (×2): qty 1

## 2022-07-05 NOTE — Plan of Care (Addendum)
Neuro: Opens eyes spontaneously, does not follow commands, BL mitts in place to prevent removal of central line and feeding tube  C/V: NSR/ST, SBP 150-180s, PRN labetalol give x1, PO BP meds started  Resp: Evidenced of retained secretions, RA, requires frequent suctioning  GI: 1 large BM  GU: Anuric  Skin: Wound vac in place mid abdomen  Plan: HD tomorrow, A/P CT w/wo contrast and head CT tonight after pretreatment for allergy, start TF    Problem: Non-Violent Restraints Interdisciplinary Plan  Goal: Will be injury free during the use of non-violent restraints  Outcome: Progressing  Flowsheets (Taken 07/05/2022 2038)  Will be injury free during the use of non-violent restraints:   Attempt all alternatives before use of restraints   Initiate least restrictive type of restraint that is effective   Provide and maintain safe environment   Include patient/family/caregiver in decisions related to safety   Notify family of initiation of restraints   Ensure safety devices are properly applied and maintained   Document observed patient actions according to protocol   Nurse to accompany patient off unit when on restraints   Remove restraints before the indicated maximum length of time when meets criteria for discontinuation   Document significant changes in patient condition   Provide debriefing as soon as possible and appropriate   Reassess need for continued restraints   Ensure that order for restraints has not expired     Problem: Inadequate Airway Clearance  Goal: Patent Airway maintained  Outcome: Progressing  Flowsheets (Taken 07/05/2022 2038)  Patent airway maintained:   Position patient for maximum ventilatory efficiency   Provide adequate fluid intake to liquefy secretions   Suction secretions as needed   Reinforce use of ordered respiratory interventions (i.e. CPAP, BiPAP, Incentive Spirometer, Acapella, etc.)   Reposition patient every 2 hours and as needed unless able to self-reposition     Problem: Infection  Prevention  Goal: Free from infection  Outcome: Progressing  Flowsheets (Taken 07/05/2022 2038)  Free from infection:   Monitor/assess vital signs   Encourage/assist patient to turn, cough and perform deep breathing every 2 hours   Assess incision for evidence of healing   Assess for signs and symptoms of infection   Monitor/assess output from surgical drain if present   Monitor/assess lab values and report abnormal values     Problem: Hemodynamic Status: Cardiac  Goal: Stable vital signs and fluid balance  Outcome: Progressing  Flowsheets (Taken 07/05/2022 2038)  Stable vital signs and fluid balance:   Assess signs and symptoms associated with cardiac rhythm changes   Monitor lab values     Problem: Renal Instability  Goal: Fluid and electrolyte balance are achieved/maintained  Outcome: Progressing  Flowsheets (Taken 07/05/2022 2038)  Fluid and electrolyte balance are achieved/maintained:   Follow fluid restrictions/IV/PO parameters   Monitor/assess lab values and report abnormal values   Assess and reassess fluid and electrolyte status   Observe for cardiac arrhythmias   Monitor for muscle weakness     Problem: Inadequate Gas Exchange  Goal: Patent Airway maintained  Outcome: Progressing  Flowsheets (Taken 07/05/2022 2038)  Patent airway maintained:   Position patient for maximum ventilatory efficiency   Provide adequate fluid intake to liquefy secretions   Suction secretions as needed   Reinforce use of ordered respiratory interventions (i.e. CPAP, BiPAP, Incentive Spirometer, Acapella, etc.)   Reposition patient every 2 hours and as needed unless able to self-reposition

## 2022-07-05 NOTE — Plan of Care (Signed)
See flowsheets and MARs for interventions and assessments.     Problem: Moderate/High Fall Risk Score >5  Goal: Patient will remain free of falls  Outcome: Progressing  Flowsheets  Taken 07/05/2022 0325  VH High Risk (Greater than 13):   ALL REQUIRED LOW INTERVENTIONS   ALL REQUIRED MODERATE INTERVENTIONS  Taken 07/05/2022 0200  High (Greater than 13): HIGH-Bed alarm on at all times while patient in bed     Problem: Non-Violent Restraints Interdisciplinary Plan  Goal: Will be injury free during the use of non-violent restraints  Outcome: Progressing  Flowsheets (Taken 07/05/2022 0325)  Will be injury free during the use of non-violent restraints:   Attempt all alternatives before use of restraints   Initiate least restrictive type of restraint that is effective   Provide and maintain safe environment   Notify family of initiation of restraints   Include patient/family/caregiver in decisions related to safety   Ensure safety devices are properly applied and maintained   Document observed patient actions according to protocol   Document significant changes in patient condition   Reassess need for continued restraints   Ensure that order for restraints has not expired     Problem: Compromised Activity/Mobility  Goal: Activity/Mobility Interventions  Outcome: Progressing  Flowsheets (Taken 07/04/2022 2000)  Activity/Mobility Interventions: Pad bony prominences, TAP Seated positioning system when OOB, Promote PMP, Reposition q 2 hrs / turn clock, Offload heels     Problem: Infection Prevention  Goal: Free from infection  Outcome: Progressing  Flowsheets (Taken 07/05/2022 0325)  Free from infection:   Monitor/assess vital signs   Encourage/assist patient to turn, cough and perform deep breathing every 2 hours   Assess incision for evidence of healing   Monitor/assess lab values and report abnormal values   Assess for signs and symptoms of infection   Monitor/assess output from surgical drain if present     Problem:  Constipation  Goal: Fluid and electrolyte balance are achieved/maintained  Outcome: Progressing  Flowsheets (Taken 07/05/2022 0325)  Fluid and electrolyte balance are achieved/maintained:   Monitor/assess lab values and report abnormal values   Assess and reassess fluid and electrolyte status   Observe for cardiac arrhythmias   Monitor for muscle weakness     Problem: Safety  Goal: Patient will be free from injury during hospitalization  Outcome: Progressing  Flowsheets (Taken 07/05/2022 0325)  Patient will be free from injury during hospitalization:   Assess patient's risk for falls and implement fall prevention plan of care per policy   Ensure appropriate safety devices are available at the bedside   Provide and maintain safe environment   Use appropriate transfer methods   Hourly rounding   Include patient/ family/ care giver in decisions related to safety   Provide alternative method of communication if needed (communication boards, writing)     Problem: Pain  Goal: Pain at adequate level as identified by patient  Outcome: Progressing  Flowsheets (Taken 07/05/2022 0325)  Pain at adequate level as identified by patient:   Identify patient comfort function goal   Assess pain on admission, during daily assessment and/or before any "as needed" intervention(s)   Reassess pain within 30-60 minutes of any procedure/intervention, per Pain Assessment, Intervention, Reassessment (AIR) Cycle   Evaluate if patient comfort function goal is met   Offer non-pharmacological pain management interventions   Include patient/patient care companion in decisions related to pain management as needed     Problem: Hemodynamic Status: Cardiac  Goal: Stable vital signs and fluid  balance  Outcome: Progressing  Flowsheets (Taken 07/05/2022 0325)  Stable vital signs and fluid balance:   Assess signs and symptoms associated with cardiac rhythm changes   Monitor lab values     Problem: Fluid and Electrolyte Imbalance/ Endocrine  Goal: Fluid and  electrolyte balance are achieved/maintained  Outcome: Progressing  Flowsheets (Taken 07/05/2022 0325)  Fluid and electrolyte balance are achieved/maintained:   Monitor/assess lab values and report abnormal values   Assess and reassess fluid and electrolyte status   Observe for cardiac arrhythmias   Monitor for muscle weakness     Problem: Diabetes: Glucose Imbalance  Goal: Blood glucose stable at established goal  Outcome: Progressing  Flowsheets (Taken 07/05/2022 0325)  Blood glucose stable at established goal:   Monitor intake and output.  Notify LIP if urine output is < 30 mL/hour.   Follow fluid restrictions/IV/PO parameters   Assess for hypoglycemia /hyperglycemia   Monitor/assess vital signs   Ensure appropriate diet and assess tolerance   Ensure adequate hydration   Ensure appropriate consults are obtained (Nutrition, Diabetes Education, and Case Management)   Ensure patient/family has adequate teaching materials     Problem: Inadequate Gas Exchange  Goal: Adequate oxygenation and improved ventilation  Outcome: Progressing  Flowsheets (Taken 07/05/2022 0325)  Adequate oxygenation and improved ventilation:   Assess lung sounds   Monitor SpO2 and treat as needed   Increase activity as tolerated/progressive mobility   Plan activities to conserve energy: plan rest periods

## 2022-07-05 NOTE — Progress Notes (Signed)
Infectious Diseases & Tropical Medicine  Progress Note    07/05/2022   Shelby Barron CSN:[id number],MRN:[id number] is a 82 y.o. female,       Assessment:     Encephalopathy worsening  History of bladder cancer and renal failure  Status post radical cystectomy and lysis of adhesion  S/p bowel resection-awaiting return of bowel function  Risk of aspiration  Leukocytosis-on steroids  Thrombocytopenia resolved    Plan:     Discontinue Flagyl-May be contributing to encephalopathy  Discontinue cefepime  Start Zosyn  Follow up chest x-ray  Continue probiotics  Monitor electrolytes and renal functions closely  Monitor clinically          Discussed with patient's friend in detail  Discussed with nursing staff     ROS:     General:  no fever, unresponsive, NG tube in place  Respiratory: No cough, mouth breathing  Gastrointestinal: no diarrhea  Genito-Urinary: no hematuria  Musculoskeletal: no edema  Neurological: c/o generalized weakness   Dermatological: No rash    Physical Examination:     Blood pressure 165/77, pulse 93, temperature 98.8 F (37.1 C), temperature source Axillary, resp. rate (!) 31, height 1.575 m (5\' 2" ), weight 87.8 kg (193 lb 9 oz), SpO2 97%.    General Appearance: Unresponsive  HEENT: Pupils are equal, round, and reactive to light.   Lungs: Decreased breath sounds  Heart:  Regular rate and rhythm  Chest: Symmetric chest wall expansion.   Abdomen: soft, no hepatosplenomegaly, no bowel sounds  Neurological: unresponsive  Extremities: No edema    Laboratory And Diagnostic Studies:     Recent Labs     07/05/22  0237 07/04/22  0432   WBC 22.46* 19.25*   Hemoglobin 7.9* 8.0*   Hematocrit 24.4* 24.2*   Platelet Count 213 174     Recent Labs     07/05/22  0237 07/04/22  0432   Sodium 143 141   Potassium 4.4 3.9   Chloride 106 104   CO2 23 24   BUN 50* 29*   Creatinine 4.0* 2.6*   Glucose 200* 206*   Calcium 7.6* 7.9     Recent Labs     07/03/22  0347 07/02/22  1749   AST (SGOT) 16  --    ALT 16  --    Alkaline  Phosphatase 61  --    Protein, Total 5.1*  --    Albumin 1.4*  1.4* 1.4*       Current Meds:      Scheduled Meds: PRN Meds:    cefepime, 1 g, Intravenous, Q24H  predniSONE, 50 mg, Oral, Once   Followed by  predniSONE, 50 mg, Oral, Once   Followed by  predniSONE, 50 mg, Oral, Once   Followed by  diphenhydrAMINE, 50 mg, Oral, Once  heparin (porcine), 5,000 Units, Subcutaneous, Q12H SCH  insulin lispro, 2-10 Units, Subcutaneous, Q4H SCH  metroNIDAZOLE, 500 mg, Intravenous, Q8H  thiamine, 100 mg, Intravenous, Q24H SCH        Continuous Infusions:     benzocaine, 1 spray, TID PRN  dextrose, 15 g of glucose, PRN   Or  dextrose, 12.5 g, PRN   Or  dextrose, 12.5 g, PRN   Or  glucagon (rDNA), 1 mg, PRN  diphenhydrAMINE, 25 mg, Q6H PRN  labetalol, 10 mg, Q2H PRN  lidocaine 2% jelly, , Once PRN  naloxone, 0.2 mg, PRN  phenol, 1 spray, Q2H PRN          Gwynneth Aliment  Robinette Haines, M.D.  07/05/2022  9:31 AM

## 2022-07-05 NOTE — Progress Notes (Cosign Needed)
4 eyes in 4 hours pressure injury assessment note:      Completed with:   Unit & Time admitted:              Bony Prominences: Check appropriate box; if wound is present enter wound assessment in LDA     Occiput:                 [x] WNL  []  Wound present  Face:                     [x] WNL  []  Wound present  Ears:                      [x] WNL  []  Wound present  Spine:                    [x] WNL  []  Wound present  Shoulders:             [x] WNL  []  Wound present  Elbows:                  [x] WNL  []  Wound present  Sacrum/coccyx:     [x] WNL  []  Wound present  Ischial Tuberosity:  [x] WNL  []  Wound present  Trochanter/Hip:      [x] WNL  []  Wound present  Knees:                   [x] WNL  []  Wound present  Ankles:                   [x] WNL  []  Wound present  Heels:                    [x] WNL  []  Wound present  Other pressure areas:  [x]  Wound location ABD       Device related: []  Device name:         LDA completed if wound present: yes/no  Consult WOCN if necessary    Other skin related issues, ie tears, rash, etc, document in Integumentary flowsheet

## 2022-07-05 NOTE — UM Notes (Signed)
Continued Stay Review  Class: Inpatient  Level of Care:  Level 2 (Intermediate Care)       Patient Name: Shelby Barron, Shelby Barron   Date of Birth 30-Nov-1940   MRN: [id number]          Summary:  Shelby Barron is a 82 y.o. female who presented to the hospital "for scheduled cystectomy and completion nephrectomy with urology.  Her last Plavix dose was 6/3, her last dialysis was 6/8.  She had preop clearance with cardiology which included an echo."     "In the OR course was complicated by  Small bowel enterotomy -General surgery called in enterotomy repaired with initial plan to put patient back into continuity"     "Later in the case significant bleeding during nephrectomy/cystectomy.  Approximately 1 L EBL.  Given significant bleeding decision made to leave patient in discontinuity and abdomen open with plans to return to the OR tomorrow."     "In the OR given  3 units PRBC  3000 mL crystalloid  colloid"         Medical History:  Past Medical History:   Diagnosis Date    Acid reflux     Asthma, well controlled     Cancer of kidney     Chronic lower back pain     Congestive heart disease     COPD (chronic obstructive pulmonary disease)     CVA (cerebral infarction) 01/12/2003    Diabetes     Diabetic nephropathy     Fibroids     Gout     H/O: gout     Hyperlipidemia     Hypertension     Neuropathy of hand     SOB (shortness of breath)            Active Problems:  Patient Active Problem List   Diagnosis    Type 2 diabetes mellitus with kidney complication, with long-term current use of insulin    Diabetes mellitus type II, uncontrolled    Neuropathy of hand    Left renal mass    Hamstring injury    Peritonitis    ESRD (end stage renal disease)    Chronic diastolic congestive heart failure    Aortic atherosclerosis    Malignant neoplasm of urinary bladder, unspecified site    Malignant tumor of kidney, left    Benign essential hypertension    GERD (gastroesophageal reflux disease)    Leukocytosis    Bladder cancer    Anemia     COPD (chronic obstructive pulmonary disease)           Care Day - 07/05/2022    Per RN Note:  B/L mitts in place to prevent pt from removing or interfering with medical equipment. Pt restless and attempting to remove IV's, Trialysis, Permacath, and interfere with wound vac.         Per Surgery Note:  PLAN:   -NPO/IVF, patient can have tube feeds from surgical perspective, and for p.o. contrast administration given SLP recommendation for n.p.o.  -Follow-up CT abdomen pelvis  -Wound care ostomy nursing consultation  - Pain control   - Transfuse PRN for anemia  - Wound VAC  - Abx: zosyn per ID  - Remainder of care per primary team       Per Attending Physician Note:  She is hypoactive and delirious. Surgery recommended NGT and CTAP today. Plan to start TF today. CTH pending.  Has [name] T/Th/Sat. Nephrology following       #  S/P Cystectomy and completion nephrectomy complicated by small bowel enterotomy   #Mixed shock - hemorrhagic vs septic: Resolved  - 6/10 small bowel enteretomy with 1L EBL. Went to the OR on June 12 for small bowel anastomosis, closure of mesenteric defect, closure of abdominal wall with wound VAC application  - Failed SLP eval yesterday. Will plan to initiate on tube feeds per nutrition recs  -Wound VAC changed 6/15  - Surgery consulted, recommend CTAP with PO contrast to eval for intra-abdominal source of infection given uptrending WBC. Surgery ordered pre-medication with steroid/benadryl given h/o contrast allergy  - trend CBC, transfuse Hgb<7. Transfused 3 units PRBC in OR. Received another unit on 6/11 and 6/12  - TTE as part of preop cardiac clearance which showed Mild aortic stenosis (AVA 1.7), grade 1 diastolic dysfunction, normal LV/RV systolic function  - ID consulted, continue IV cefepime and flagyl     #History of CVA  #Encephalopathy/delirium  - CT head pending  - Hold home plavix  - Adhere to day/night cycles and administer constant reorientation.   - CAM ICU monitoring with  early initiation of atypical antipsychotics as needed for control of agitated delirium.   - Out of bed to chair with PT/OT consult as indicated.      #Acute hypoxemic respiratory failure  #COPD  Extubated on 6/13, currently on 2L NC   Maintain SaO2 > 92%     #ESRD  HD Tues/Thurs/Sat  Will need eventual permcath exchange this week      #HTN  Started on norvasc  Off nicardipine gtt, PRN IV labetalol for SBP > 180      #Renal cell carcinoma  #Urothelial carcinoma  #Thrombocytopenia  - VTE ppx: SCDs, subcutaneous heparin     #Diabetes Mellitus type 2  -Sliding scale insulin        Nutrition: NPO          Per Nephrology Note:  Recommendations:   Will need eventual permcath exchange this week  For CT head and a/p today  HD tomorrow  Start norvasc        Vitals:  Patient Vitals for the past 24 hrs:   BP Temp Temp src Pulse Resp SpO2 Weight   07/05/22 1606 -- 98.4 F (36.9 C) Axillary -- -- -- --   07/05/22 1524 154/70 -- -- -- -- -- --   07/05/22 1519 196/84 -- -- -- -- -- --   07/05/22 1500 155/80 -- -- 100 (!) 33 97 % --   07/05/22 1330 (!) 179/98 -- -- -- -- -- --   07/05/22 1200 179/79 98.3 F (36.8 C) Axillary (!) 109 17 98 % --   07/05/22 1100 185/81 -- -- (!) 103 (!) 32 96 % --   07/05/22 1000 167/79 -- -- 93 (!) 28 98 % --   07/05/22 0900 173/79 -- -- 93 (!) 31 97 % --   07/05/22 0800 163/67 98.8 F (37.1 C) Axillary 92 20 96 % --   07/05/22 0700 165/77 -- -- 93 (!) 31 97 % --   07/05/22 0600 (!) 168/98 -- -- 97 (!) 28 98 % --   07/05/22 0500 167/78 -- -- 92 (!) 29 97 % --   07/05/22 0400 181/77 99.1 F (37.3 C) Axillary 92 (!) 28 97 % 87.8 kg (193 lb 9 oz)   07/05/22 0300 178/77 -- -- 91 (!) 36 98 % --   07/05/22 0200 172/77 -- -- 88 (!) 33 98 % --  07/05/22 0100 178/77 -- -- 86 (!) 35 98 % --   07/05/22 0000 169/78 98.6 F (37 C) Axillary 81 (!) 36 97 % --   07/04/22 2300 139/65 -- -- 84 (!) 30 95 % --   07/04/22 2230 147/67 -- -- 84 (!) 31 97 % --   07/04/22 2200 176/77 -- -- 82 (!) 40 96 % --   07/04/22  2130 180/82 -- -- 87 (!) 34 95 % --   07/04/22 2110 181/84 -- -- 87 (!) 36 97 % --   07/04/22 2100 (!) 208/91 -- -- 91 (!) 50 97 % --   07/04/22 2000 174/74 98.3 F (36.8 C) Axillary 83 (!) 29 98 % --   07/04/22 1900 168/76 -- -- 80 20 97 % --   07/04/22 1830 188/81 -- -- 89 15 98 % --   07/04/22 1800 (!) 191/103 -- -- 88 20 99 % --     SpO2: 97 % (07/05/2022  3:00 PM)  O2 Device: None (Room air) (07/05/2022  8:00 AM)  O2 Flow Rate (L/min): 2 L/min (07/02/2022  8:00 AM)  Oximetry Probe Site Changed: No (07/05/2022  8:00 AM)      Weight:   Recent Weights for the past 720 hrs (Last 3 readings):   Weight   07/05/22 0400 87.8 kg (193 lb 9 oz)   07/04/22 0400 87.8 kg (193 lb 9 oz)   07/03/22 0400 93 kg (205 lb 0.4 oz)         Labs:   Latest Reference Range & Units 07/05/22 02:37   WBC 3.10 - 9.50 x10 3/uL 22.46 (H)   Hemoglobin 11.4 - 14.8 g/dL 7.9 (L)   Hematocrit 16.1 - 43.7 % 24.4 (L)   Glucose 70 - 100 mg/dL 096 (H)   BUN 7 - 21 mg/dL 50 (H)   Creatinine 0.4 - 1.0 mg/dL 4.0 (H)   Calcium 7.9 - 10.2 mg/dL 7.6 (L)   EGFR >=04.5 WU/JWJ/1.91 m2 10.5 (L)         Imaging:  XR Abdomen AP    Result Date: 07/05/2022  Enteric tube terminates in the distal stomach. Elease Etienne, DO 07/05/2022 12:24 PM         Medications:  Current Facility-Administered Medications   Medication Dose Route Frequency    amLODIPine  5 mg Oral Daily    cefepime  1 g Intravenous Q24H    predniSONE  50 mg Oral Once    Followed by    predniSONE  50 mg Oral Once    Followed by    diphenhydrAMINE  50 mg Oral Once    heparin (porcine)  5,000 Units Subcutaneous Q12H [facility]    insulin lispro  2-10 Units Subcutaneous Q4H SCH    metroNIDAZOLE  500 mg Intravenous Q8H    thiamine  100 mg Intravenous Q24H SCH         One Time Medications:   predniSONE (DELTASONE) tablet 50 mg  Dose: 50 mg  Freq: Once Route: PO  Start: 07/05/22 0930 End: 07/05/22 1337    1337 [C]2                     PRN Medications:  labetalol (NORMODYNE,TRANDATE) injection 10 mg  Dose: 10 mg  Freq:  Every 2 hours PRN Route: IV  PRN Reason: High Blood Pressure  PRN Comment: SBP > 180  Start: 07/03/22 1225    1520 [C]1  Doree Albee RN, BSN  UR Case Manager  [facility]  [email or web address]  Phone: [phone]  Fax: [phone]  6:07 PM  07/05/2022              UTILIZATION REVIEW CONTACT: Name:  Doree Albee, RN BSN  Utilization Review  [facility]  Address:  [address] [place], [place] [address]  NPI:   [id number]  Tax ID:  [id number]  Phone: [phone], Option 2  Fax:  [phone]    Please use fax number [phone] to provide authorization for hospital services or to request additional information.        NOTES TO REVIEWER:    This clinical review is based on/compiled from documentation provided by the treatment team within the patient's medical record.

## 2022-07-05 NOTE — Progress Notes (Signed)
LOS # 7       Summary of Discharge Plan:Pt to discharge Home with [facility] vs TBD ( recs pending)        Identified Possible Discharge Barriers:Medical stability ( persistent encephalopathy; now hypertensive; will need permcath exchange this week)        CM Interventions and Outcome: CM reviewed pt's chart        Discussed above Discharge Plan with (patient, family, Care Team, others): Care Team        Case Management will continue to follow for [place] needs that may arise.     Marylou Mccoy RN BSN CM I  [facility]  [phone]

## 2022-07-05 NOTE — Progress Notes (Signed)
[facility]  PROGRESS NOTE      Patient: Shelby Barron  Date: 07/05/2022   LOS: 7 Days  Admission Date: 06/28/2022   MRN: [id number]  Attending: Lennox Pippins, MD  When on service as the attending, please contact me on Epic Secure Chat from 7 AM - 7 PM for non-urgent issues. For urgent matters use XTend page from 7 AM - 7 PM.       ASSESSMENT/PLAN     Shelby Barron is a 82 y.o. female admitted with Bladder cancer    Interval Summary: [name], [name], type 2 diabetes, renal cell carcinoma status post partial bilateral nephrectomy, [name], new diagnosis of urothelial cancer initally presented on 6/10 for elective cystectomy and completion nephrectomy, [name], [name], extubated and off pressors since 6/13.     She is hypoactive and delirious. Surgery recommended NGT and CTAP today. Plan to start TF today. CTH pending.  Has [name] T/Th/Sat. Nephrology following     Patient Active Hospital Problem List:  #S/P Cystectomy and completion nephrectomy complicated by small bowel enterotomy   #Mixed shock - hemorrhagic vs septic: Resolved  - 6/10 small bowel enteretomy with 1L EBL. Went to the OR on June 12 for small bowel anastomosis, closure of mesenteric defect, closure of abdominal wall with wound VAC application  - Failed SLP eval yesterday. Will plan to initiate on tube feeds per nutrition recs  -Wound VAC changed 6/15  - Surgery consulted, recommend CTAP with PO contrast to eval for intra-abdominal source of infection given uptrending WBC. Surgery ordered pre-medication with steroid/benadryl given h/o contrast allergy  - trend CBC, transfuse Hgb<7. Transfused 3 units PRBC in OR. Received another unit on 6/11 and 6/12  - TTE as part of preop cardiac clearance which showed Mild aortic stenosis (AVA 1.7), grade 1 diastolic dysfunction, normal LV/RV systolic function  - ID  consulted, continue IV cefepime and flagyl     #History of CVA  #Encephalopathy/delirium  - CT head pending  - Hold home plavix  - Adhere to day/night cycles and administer constant reorientation.   - CAM ICU monitoring with early initiation of atypical antipsychotics as needed for control of agitated delirium.   - Out of bed to chair with PT/OT consult as indicated.      #Acute hypoxemic respiratory failure  #COPD  Extubated on 6/13, currently on 2L NC   Maintain SaO2 > 92%     #ESRD  HD Tues/Thurs/Sat  Will need eventual permcath exchange this week     #HTN  Started on norvasc  Off nicardipine gtt, PRN IV labetalol for SBP > 180     #Renal cell carcinoma  #Urothelial carcinoma  #Thrombocytopenia  - VTE ppx: SCDs, subcutaneous heparin     #Diabetes Mellitus type 2  -Sliding scale insulin          Nutrition: NPO  Malnutrition Documentation    Moderate Malnutrition related to inadequate nutritional intake in the setting of acute illness as evidenced by <50% EER > 5 days, mild muscle losses (temporalis, deltoid) - new                 Patient has BMI=Body mass index is 35.4 kg/m.  Diagnosis: Obesity based on BMI criteria         Recent Labs   Lab 07/05/22  1610 07/04/22  0432 07/03/22  0347   Hemoglobin 7.9* 8.0* 7.6*   Hematocrit 24.4* 24.2* 22.7*   MCV 92.8 92.0 91.2   WBC 22.46* 19.25* 13.59*   Platelet Count 213 174 129*       Recent Labs   Lab 06/02/22  0514   Iron 62   TIBC 145*   Iron Saturation 43       Code Status: Full Code    Dispo: pending improved mental status    Family Contact: none    DVT Prophylaxis:   Current Facility-Administered Medications (Includes Only Anticoagulants, Misc. Hematological)   Medication Dose Route Last Admin    heparin (porcine) injection 5,000 Units  5,000 Units Subcutaneous 5,000 Units at 07/05/22 1023          CHART  REVIEW & DISCUSSION     The following chart items were reviewed as of 11:42 AM on 07/05/22:  []  Lab Results []  Imaging Results   []  Problem List  []  Current  Orders []  Current Medications  []  Allergies  []  Code Status []  Previous Notes   []  SDoH    The management and plan of care for this patient was discussed with the following specialty consultants:  []  Cardiology  []  Gastroenterology                 []  Infectious Disease  []  Pulmonology []  Neurology                [x]  Nephrology  []  Neurosurgery []  Orthopedic Surgery  []  Heme/Onc  []  General Surgery []  Psychiatry                                   []  Palliative    SUBJECTIVE     [name] moaning, not responding appropriately   Family updated bedside    MEDICATIONS     Current Facility-Administered Medications   Medication Dose Route Frequency    cefepime  1 g Intravenous Q24H    predniSONE  50 mg Oral Once    Followed by    predniSONE  50 mg Oral Once    Followed by    predniSONE  50 mg Oral Once    Followed by    diphenhydrAMINE  50 mg Oral Once    heparin (porcine)  5,000 Units Subcutaneous Q12H [facility]    insulin lispro  2-10 Units Subcutaneous Q4H SCH    metroNIDAZOLE  500 mg Intravenous Q8H    thiamine  100 mg Intravenous Q24H SCH       PHYSICAL EXAM     Vitals:    07/05/22 0800   BP:    Pulse:    Resp:    Temp: 98.8 F (37.1 C)   SpO2:        Temperature: Temp  Min: 98.3 F (36.8 C)  Max: 99.1 F (37.3 C)  Pulse: Pulse  Min: 80  Max: 97  Respiratory: Resp  Min: 15  Max: 50  Non-Invasive BP: BP  Min: 139/65  Max: 208/91  Pulse Oximetry SpO2  Min: 94 %  Max: 99 %    Intake and Output Summary (Last 24 hours) at Date Time    Intake/Output Summary (Last 24 hours) at 07/05/2022 1142  Last data filed at 07/05/2022 0400  Gross per 24 hour   Intake 100 ml   Output 10 ml   Net 90 ml  GEN APPEARANCE: Normal;  A&OX0  HEENT: PERLA; EOMI; Conjunctiva Clear  NECK: Supple; No bruits  CVS: RRR, S1, S2; No M/G/R  LUNGS: CTAB; No Wheezes; No Rhonchi: No rales  ABD: Soft; No TTP; + Normoactive BS  EXT: No edema; Pulses 2+ and intact  NEURO: moving UEs spontaneously, moaning intermittently unable to cooperate fully in MS  exam  MENTAL STATUS:     LABS     Recent Labs   Lab 07/05/22  0237 07/04/22  0432 07/03/22  0347   WBC 22.46* 19.25* 13.59*   RBC 2.63* 2.63* 2.49*   Hemoglobin 7.9* 8.0* 7.6*   Hematocrit 24.4* 24.2* 22.7*   MCV 92.8 92.0 91.2   Platelet Count 213 174 129*       Recent Labs   Lab 07/05/22  0237 07/04/22  0432 07/03/22  1412 07/03/22  0347 07/02/22  1749 07/02/22  0502 07/02/22  0053   Sodium 143 141 137 136 137  More results in Results Review  --    Potassium 4.4 3.9 3.5 4.3 4.5  More results in Results Review  --    Chloride 106 104 103 106 105  More results in Results Review  --    CO2 23 24 25 23 26   More results in Results Review  --    BUN 50* 29* 15 23* 13  More results in Results Review  --    Creatinine 4.0* 2.6* 1.4* 1.9* 1.2*  More results in Results Review  --    Glucose 200* 206* 269* 238* 196*  More results in Results Review  --    Calcium 7.6* 7.9 8.3 10.4* 10.6*  More results in Results Review  --    Magnesium 2.2 2.1 1.9 2.4  --   --  2.5   More results in Results Review = values in this interval not displayed.       Recent Labs   Lab 07/03/22  0347 07/02/22  1749 07/02/22  1122 07/02/22  0502 07/01/22  1120 07/01/22  0506   ALT 16  --   --  18  --  18   AST (SGOT) 16  --   --  23  --  25   Bilirubin, Total 0.2  --   --  0.3  --  0.4   Bilirubin Direct 0.1  --   --  0.2  --  0.2   Albumin 1.4*  1.4* 1.4* 1.4* 1.5*  1.4*  More results in Results Review 1.4*  1.4*   Alkaline Phosphatase 61  --   --  64  --  68   More results in Results Review = values in this interval not displayed.             Recent Labs   Lab 07/01/22  1120 07/01/22  0506 06/30/22  2310   INR 1.1 1.0 1.1   PT 12.6 12.1 12.3   PTT 35 38 27       Microbiology Results (last 15 days)       Procedure Component Value Units Date/Time    Culture, Sputum and Lower Respiratory [[id number]]  (Abnormal) Collected: 07/04/22 1034    Order Status: Completed Specimen: Sputum, Suctioned Updated: 07/04/22 1856     Gram Stain Few WBCs      Rare  Squamous epithelial cells      Few Mixed respiratory flora    Rectal Swab Carbapenemase Surveillance, PCR [[id number]]  (Normal) Collected: 06/29/22 1842  Order Status: Completed Specimen: Swab from Rectum Updated: 06/30/22 0150     IMP gene sequence Not Detected     VIM gene sequence Not Detected     NDM gene sequence Not Detected     KPC gene sequence Not Detected     OXA-48 gene sequence Not Detected    Narrative:      This test utilizes real-time PCR for the detection and differentiation of gene sequences associated with carbapenem-non-susceptibility in Enterobacterales (Enterobacteriaceae), Pseudomonas aeruginosa and Acinetobacter baumannii. Targets include blaKPC, blaNDM, blaVIM, blaOXA-48 (and blaOXA-181), and blaIMP (excluding IMP-7, IMP-13, IMP-14). Detection of these gene sequences does not indicate the presence of viable organisms. This assay does not report variants of these gene sequences. Mutations or polymorphisms may affect detection of current, new, or unknown variants, resulting in a false negative result. This test is for infection prevention surveillance purposes only and should not be used to make treatment decisions. The performance of the Xpert Carba-R Assay has not been evaluated with rectal swab specimens from pediatric patients.    Urine culture [[id number]] Collected: 06/21/22 1614    Order Status: Completed Specimen: Urine, Clean Catch Updated: 06/22/22 1737    Narrative:      ORDER#: [id number]                                    ORDERED BY: Nonda Lou  SOURCE: Urine, Clean Catch Clean Catch               COLLECTED:  06/21/22 16:14  ANTIBIOTICS AT COLL.:                                RECEIVED :  06/21/22 19:20  Culture Urine                              FINAL       06/22/22 17:37  06/22/22   30,000 - 50,000 CFU/ML of normal urogenital or skin microbiota, no             further work               RADIOLOGY     XR Chest AP Portable    Result Date: 06/30/2022  1. Lines and tubes in adequate  position. No pneumothorax. 2. Trace left pleural effusion and bibasilar atelectasis. Jasmine December D'Heureux, MD 06/30/2022 4:53 PM    XR Chest AP Portable    Result Date: 06/30/2022  Catheters as described. Pankaj Dominica, MD 06/30/2022 2:07 PM    XR Chest AP Portable    Result Date: 06/28/2022   Right mainstem bronchial intubation. The tube should be withdrawn about 4 cm. Dr. Tildon Husky is aware. Wynema Birch, MD 06/28/2022 4:56 PM    XR OR MISCOUNT    Result Date: 06/28/2022   No unexpected foreign body. These critical results were discussed with and acknowledged by Angie R., the circulating nurse, who will communicate the findings to Everardo All, MD on 06/28/2022 3:02 PM. Bosie Helper, MD 06/28/2022 3:03 PM   Echo Results       None          No results found for this or any previous visit.    SignedLennox Pippins, MD  11:42 AM 07/05/2022

## 2022-07-05 NOTE — Progress Notes (Signed)
[facility] PROGRESS NOTE      Date of Note: 07/05/2022   Patient Name: Shelby Barron     Date of Birth:  April 09, 1940     MRN:               [id number]     PCP:               Patsy Lager, MD     I am available on epic chart and [employer] link extension [phone] Monday - Friday 8:30-16:30. If urology consultation is needed outside these hours please contact [facility] at [phone].      ASSESSMENT/  PLAN     -Continue supportive care, appreciate ICU and general surgery input  -no additional urology recs    I will discuss/discussed the plan of care with the following services:  Dr. Newt Lukes, Urology   Family at bedside and over the phone    I spent a total of 10 minutes involved in this patient's care    Sheldon Silvan, PA  07/05/2022, 8:24 PM    SUBJECTIVE     Interval History: 6/10 radical cystectomy and bilateral nephrectomy with enterotomy  6/12 abdominal wound closure, bowel anastamosis,    Continues to improve medically, but still not very communicative.  General surgery getting CT given elevated WBC and tachy       Patient Active Problem List    Diagnosis Date Noted    Anemia 06/30/2022    COPD (chronic obstructive pulmonary disease) 06/30/2022    Bladder cancer 06/28/2022    Malignant neoplasm of urinary bladder, unspecified site 06/25/2022    Malignant tumor of kidney, left 06/25/2022    GERD (gastroesophageal reflux disease) 06/25/2022    Leukocytosis 06/25/2022    Chronic diastolic congestive heart failure 06/23/2022    Aortic atherosclerosis 06/23/2022    Peritonitis 05/26/2022    ESRD (end stage renal disease) 05/26/2022    Hamstring injury 05/10/2013    Benign essential hypertension 02/10/2013    Left renal mass 11/28/2012    Type 2 diabetes mellitus with kidney complication, with long-term current use of insulin 07/31/2010    Diabetes mellitus type II, uncontrolled 07/31/2010    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        OBJECTIVE     Vital Signs: BP 150/90   Pulse 97   Temp 98 F (36.7 C) (Axillary)    Resp (!) 27   Ht 1.575 m (5\' 2" )   Wt 87.8 kg (193 lb 9 oz)   SpO2 98%   BMI 35.40 kg/m     TMax: Temp (24hrs), Avg:98.5 F (36.9 C), Min:98 F (36.7 C), Max:99.1 F (37.3 C)    I/Os:   Intake/Output Summary (Last 24 hours) at 07/05/2022 2024  Last data filed at 07/05/2022 0400  Gross per 24 hour   Intake 100 ml   Output 10 ml   Net 90 ml             LABS & IMAGING     CBC  Recent Labs     07/05/22  0237 07/04/22  0432 07/03/22  0347   WBC 22.46* 19.25* 13.59*   RBC 2.63* 2.63* 2.49*   Hematocrit 24.4* 24.2* 22.7*   MCV 92.8 92.0 91.2   MCH 30.0 30.4 30.5   MCHC 32.4 33.1 33.5   RDW 15 15 15    MPV 10.0 10.2 10.5       BMP  Recent Labs     07/05/22  1610 07/04/22  0432 07/03/22  1412   CO2 23 24 25    Anion Gap 14.0 13.0 9.0   BUN 50* 29* 15   Creatinine 4.0* 2.6* 1.4*       INR  Lab Results   Component Value Date/Time    INR 1.1 07/01/2022 11:20 AM    INR 1.1 06/01/2022 03:15 AM         IMAGING:  XR Abdomen AP   Final Result      Enteric tube terminates in the distal stomach.      Elease Etienne, DO   07/05/2022 12:24 PM      XR Chest AP Portable   Final Result         1. Lines and tubes in adequate position. No pneumothorax.   2. Trace left pleural effusion and bibasilar atelectasis.      Jasmine December D'Heureux, MD   06/30/2022 4:53 PM      XR Chest AP Portable   Final Result      Catheters as described.      Pankaj Dominica, MD   06/30/2022 2:07 PM      XR Chest AP Portable   Final Result    Right mainstem bronchial intubation. The tube should be   withdrawn about 4 cm.      Dr. Tildon Husky is aware.      Wynema Birch, MD   06/28/2022 4:56 PM      XR OR MISCOUNT   Final Result    No unexpected foreign body.      These critical results were discussed with and acknowledged by Angie R.,   the circulating nurse, who will communicate the findings to Everardo All,   MD on 06/28/2022 3:02 PM.         Bosie Helper, MD   06/28/2022 3:03 PM      CT Abdomen Pelvis W IV And PO Cont    (Results Pending)   CT Head WO Contrast    (Results  Pending)

## 2022-07-05 NOTE — Progress Notes (Signed)
Nutritional Support Services  Nutrition Follow-up    Shelby Barron 82 y.o. female   MRN: [id number]    Summary of Nutrition Recommendations:    1. Initiate tube feeding of Glucerna 1.5 @ 20ml/hr, increase 10ml Q6 to goal rate of 82ml/hr x 24hrs/day   Provides 1620 kcals, 89g protein, free H2O    2. Flush Prosource 1 pkt daily    Each packet provides 80 kcals, 20 g protein      3. Flush tube with minimum of 100 ml water Q 4 hours for tube patency- additional fluids for hydration per MD     Total EN regimen provides 1700 kcals, 109g protein, H2O  Meets 100% kcal/protein needs      4. Monitor pt's ability to take PO      -----------------------------------------------------------------------------------------------------------------    D/w RN                                                       ASSESSMENT DATA     Subjective Nutrition: Pt seen by SLP yesterday, rec NPO. Plan for EMFT placement today and tube feeding initiation. Pt without nutrition x 1-2 days. LBM 6/15. Surgery ok with tube feeding per note. MD provided order for tube feeding initiation.     Learning Needs: no educational needs    Events of Current Admission:  82 yo female with PHMx COPD, CVA, DM, renal cell carcinoma s/p partial b/l nephrectomy, [name], new diagnosis of urothelial CA, presenting for scheduled cystectomy and complete nephrectomy. OR course c/b small bowel enterotomy and significant bleeding.   6/11: CRRT initiated  6/12: OR for small bowel anastomosis, closure of mesenteric defect, closure of abdominal wall with wound VAC application  6/13: Concern for prolonged NPO status, TPN initiated  6/14: Extubation  6/15: ROBF, TPN completed with plan for transition to PO/EN pending swallow. Transition from CRRT to HD   6/16: SLP eval, rec NPO.    Medical Hx:  has a past medical history of Acid reflux, Asthma, well controlled, Cancer of kidney, Chronic lower back pain, Congestive heart disease, COPD (chronic obstructive  pulmonary disease), CVA (cerebral infarction) (01/12/2003), Diabetes, Diabetic nephropathy, Fibroids, Gout, H/O: gout, Hyperlipidemia, Hypertension, Neuropathy of hand, and SOB (shortness of breath).       Orders Placed This Encounter   Procedures    Diet NPO effective now       ANTHROPOMETRIC  Height: 157.5 cm (5\' 2" )  Weight: 87.8 kg (193 lb 9 oz)  Weight Change: 0  Body mass index is 35.4 kg/m.      Weight Monitoring     Weight Weight Method   05/26/2022 88.451 kg  Stated     88.5 kg  Standing Scale    06/23/2022 84.823 kg     06/25/2022 87.091 kg     06/28/2022 87.09 kg  Standing Scale    06/29/2022 91.3 kg  Bed Scale    06/30/2022 96 kg  Bed Scale    07/01/2022 96.7 kg  Bed Scale    07/02/2022 97.6 kg  Bed Scale    07/03/2022 93 kg  Bed Scale    07/04/2022 87.8 kg  Bed Scale    07/05/2022 87.8 kg  Bed Scale        Weight History Summary: stable weights  ESTIMATED NEEDS    Total Daily Energy Needs: 1305 to 1740 kcal  Method for Calculating Energy Needs: 15 kcal - 20 kcal per kg  at 87 kg (Actual body weight)  Rationale: obese, critical illness, CRRT       Total Daily Protein Needs: 104.4 to 130.5 g  Method for Calculating Protein Needs: 2 g - 2.5 g per kg at 52.2 kg (Ideal body weight)  Rationale: obese, critical illness, CRRT      Total Daily Fluid Needs: 1305 to 1740 ml  Method for Calculating Fluid Needs: 1 ml per kcal energy = 1305 to 1740 kcal  Rationale: bmi, age    Pertinent Medications: prednisone, thiamine, cefepime, SSI high dose, flagyl      Pertinent labs:  Recent Labs   Lab 07/05/22  0237 07/04/22  0432 07/03/22  1412 07/03/22  0347 07/02/22  1749   Sodium 143 141 137 136 137   Potassium 4.4 3.9 3.5 4.3 4.5   Chloride 106 104 103 106 105   CO2 23 24 25 23 26    BUN 50* 29* 15 23* 13   Creatinine 4.0* 2.6* 1.4* 1.9* 1.2*   Glucose 200* 206* 269* 238* 196*   Calcium 7.6* 7.9 8.3 10.4* 10.6*   Magnesium 2.2 2.1 1.9 2.4  --    Phosphorus 3.4 2.0* 1.6* 1.3* 1.4*   GFR 10.5* 17.5* 36.9* 25.6* 44.4*       Physical  Assessment: July 05, 2022 Limitations include pt unable to participate, mouth open skewing buccal region, mitts  Head: temple region: slight depression with decrease in muscle tone/resistance (mild muscle loss - temporalis) and orbital region: slightly dark circles, somewhat hollow look, some decrease in bounce back of fat pads (mild fat loss)  Upper Body: clavicle bone region: some protrusion of the clavicle with decrease in muscle tone/resistance (mild muscle loss - pectoralis major), shoulder and acromion bone region: slight protrusion of acromion process, decrease in muscle tone/resistance (moderate muscle loss - deltoid), and upper arm region: some fat in pinch between fingers, but not ample (mild fat loss)  Lower Body: anterior thigh region: well-rounded, well-developed, patella not prominent, feel muscle tone/resistance in quadriceps to patella (no wasting observed) and posterior calf region: some shape to the bulb, but not well-developed, decrease in muscle tone/resistance (mild muscle loss - gastrocnemius)  Edema: edema: no sign of fluid accumulation   Skin: abd surgical incision per flow sheet  GI function:  abd rounded, tender, hypoactive BS                                                    NUTRITION DIAGNOSIS     Moderate Malnutrition related to inadequate nutritional intake in the setting of acute illness as evidenced by <50% EER > 5 days, mild muscle losses (temporalis, deltoid) - active                                                            INTERVENTION     Nutrition recommendation - Please refer to top of note  MONITORING/EVALUATION     Goals:    1. Patient will tolerate PN initiation/advancement by next RD follow up - d/c, no longer relevant  2. Patient will meet >80% of nutritional needs via tube feeding by next RD follow up  - new    Nutrition Risk Level: High (will follow up at least 2 times per week and PRN)        Baxter Hire, RD,  CNSC  Clinical Dietitian  [phone]

## 2022-07-05 NOTE — Progress Notes (Signed)
B/L mitts in place to prevent pt from removing or interfering with medical equipment. Pt restless and attempting to remove IV's, Trialysis, Permacath, and interfere with wound vac. [name], [name], aware.

## 2022-07-05 NOTE — Progress Notes (Signed)
07/05/22 1400   Volunteer Chaplain   Visit Type Initial   Present at Visit Patient;Family members   Spiritual Care Provided to patient;family members   Reason for Visit Spiritual Support   Spiritual Care Interventions Provided prayer;Provided comfort, encouragement,affirmation   Spiritual Care Outcomes Made a positive connection with family/caregiver;Family expressed appreciation of visit;Patient appears to have appreciated visit   Length of Visit 0-15 minutes   Follow-up Follow-up routine (3)

## 2022-07-05 NOTE — Progress Notes (Signed)
GENERAL SURGERY  PROGRESS NOTE    Date Time: 07/05/22 8:29 AM  Patient Name: [name] Day: 7    ASSESSMENT:     Patient Active Problem List   Diagnosis    Type 2 diabetes mellitus with kidney complication, with long-term current use of insulin    Diabetes mellitus type II, uncontrolled    Neuropathy of hand    Left renal mass    Hamstring injury    Peritonitis    ESRD (end stage renal disease)    Chronic diastolic congestive heart failure    Aortic atherosclerosis    Malignant neoplasm of urinary bladder, unspecified site    Malignant tumor of kidney, left    Benign essential hypertension    GERD (gastroesophageal reflux disease)    Leukocytosis    Bladder cancer    Anemia    COPD (chronic obstructive pulmonary disease)       Shelby Barron is a 82 y.o. female with bladder cancer and renal failure admitted for cystectomy with bilateral nephrectomy complicated by enterotomy requiring small bowel resection in discontinuity and negative pressure temporary abdominal closure given pressure support and need for resuscitation. Returned to the OR 6/12 for re-anastomosis and abdominal closure.  SLP with recommendation for n.p.o.  Concern for vaginal discharge, discussed with urology, this is anticipated given partial vaginectomy.  Patient had about bowel movement yesterday.  Given increasing leukocytosis and episodes of tachycardia will obtain CT abdomen pelvis with IV and p.o. contrast to further evaluate for possible intra-abdominal source.    PLAN:   -NPO/IVF, patient can have tube feeds from surgical perspective, and for p.o. contrast administration given SLP recommendation for n.p.o.  -Follow-up CT abdomen pelvis  -Wound care ostomy nursing consultation  - Pain control   - Transfuse PRN for anemia  - Wound VAC  - Abx: zosyn per ID  - Remainder of care per primary team     SUBJECTIVE:   No acute overnight events Notes    Abdominal pain tolerable no episodes of emesis, 1X bowel movement  overnight.     Objective:   Current Vitals:   Vitals:    07/05/22 0800   BP:    Pulse:    Resp:    Temp: 98.8 F (37.1 C)   SpO2:        Intake and Output Summary (Last 24 hours):  I/O last 3 completed shifts:  In: 756.67 [I.V.:225.67; IV Piggyback:300]  Out: 10 [Other:10]    Labs:     Results       Procedure Component Value Units Date/Time    Basic Metabolic Panel [[id number]]  (Abnormal) Collected: 07/05/22 0237    Specimen: Blood, Venous Updated: 07/05/22 0318     Glucose 200 mg/dL      BUN 50 mg/dL      Creatinine 4.0 mg/dL      Calcium 7.6 mg/dL      Sodium 409 mEq/L      Potassium 4.4 mEq/L      Chloride 106 mEq/L      CO2 23 mEq/L      Anion Gap 14.0     GFR 10.5 mL/min/1.73 m2     Magnesium [[id number]]  (Normal) Collected: 07/05/22 0237    Specimen: Blood, Venous Updated: 07/05/22 0318     Magnesium 2.2 mg/dL     Phosphorus [[id number]]  (Normal) Collected: 07/05/22 0237    Specimen: Blood, Venous Updated: 07/05/22 0318     Phosphorus  3.4 mg/dL     CBC without Differential [[id number]]  (Abnormal) Collected: 07/05/22 0237    Specimen: Blood, Venous Updated: 07/05/22 0251     WBC 22.46 x10 3/uL      Hemoglobin 7.9 g/dL      Hematocrit 40.9 %      Platelet Count 213 x10 3/uL      MPV 10.0 fL      RBC 2.63 x10 6/uL      MCV 92.8 fL      MCH 30.0 pg      MCHC 32.4 g/dL      RDW 15 %      nRBC % 0.2 /100 WBC      Absolute nRBC 0.04 x10 3/uL     Calcium, Ionized [[id number]]  (Normal) Collected: 07/05/22 0237    Specimen: Blood, Intravenous Line Updated: 07/05/22 0250     Calcium, Ionized 2.32 mEq/L     Culture, Sputum and Lower Respiratory [[id number]]  (Abnormal) Collected: 07/04/22 1034    Specimen: Sputum, Suctioned Updated: 07/04/22 1856     Gram Stain Few WBCs      Rare Squamous epithelial cells      Few Mixed respiratory flora            Rads:     Radiology Results (24 Hour)       ** No results found for the last 24 hours. **              Medications:     Current Facility-Administered Medications   Medication  Dose Route Frequency    cefepime  1 g Intravenous Q24H    heparin (porcine)  5,000 Units Subcutaneous Q12H SCH    insulin lispro  2-10 Units Subcutaneous Q4H SCH    metroNIDAZOLE  500 mg Intravenous Q8H    thiamine  100 mg Intravenous Q24H SCH         benzocaine, dextrose **OR** dextrose **OR** dextrose **OR** glucagon (rDNA), labetalol, naloxone     Physical Exam:     Physical Exam  Vitals reviewed.   Constitutional:       General: She is not in acute distress.     Appearance: She is not ill-appearing, toxic-appearing or diaphoretic.   Eyes:      General: No scleral icterus.  Cardiovascular:      Rate and Rhythm: Normal rate and regular rhythm.      Pulses: Normal pulses.   Pulmonary:      Effort: Pulmonary effort is normal.      Comments: On RA  Abdominal:      General: There is no distension.      Palpations: Abdomen is soft.      Tenderness: There is no abdominal tenderness.      Comments: Wound vac in place holding suction, serosanguinous output in canister    wound cdi with staples in place   Skin:     General: Skin is warm.      Coloration: Skin is not jaundiced.   Neurological:      Mental Status: She is disoriented.

## 2022-07-05 NOTE — Progress Notes (Signed)
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 s/p bilateral radical nephrectomies and  cystectomy on 06/28/22  - complicated by small bowel enterotomy and blood loss - patient left in discontinuity   Hypotension - resolved, now hypertensive   High grade urothelial bladder CA: s/p bilateral nephrectomies and cystectomy c/b mall bowel enterotomy and blood loss (~1L) on 06/28/22  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 (approx 1L) - stable  Hypophosphatemia  - better  Encephalopathy - persists    Recommendations:   Will need eventual permcath exchange this week  For CT head and a/p today  HD tomorrow  Start norvasc      Case discussed with: daughter, hospitalist, RN      Debara Pickett, MD, MD  [facility]  [phone] (office)  [phone] (FFX Spectra-Link)      Subjective:  NGT placed  Remains confused; BP high    Review of Systems:   Unable to assess, patient is confused     Physical Exam:     Vitals:    07/05/22 0900 07/05/22 1000 07/05/22 1100 07/05/22 1200   BP: 173/79 167/79 185/81 179/79   Pulse: 93 93 (!) 103 (!) 109   Resp: (!) 31 (!) 28 (!) 32 17   Temp:    98.3 F (36.8 C)   TempSrc:    Axillary   SpO2: 97% 98% 96% 98%   Weight:       Height:           Intake and Output Summary (Last 24 hours) at Date Time    Intake/Output Summary (Last 24 hours) at 07/05/2022 1328  Last data filed at 07/05/2022 0400  Gross per 24 hour   Intake 100 ml   Output 10 ml   Net 90 ml       General: confused  Cardiovascular: regular rate and rhythm  Lungs:  bilateral air entry  Abdomen: soft  Extremities: edema noted in her hands    Access: TDC    Meds:      Scheduled Meds: PRN Meds:    amLODIPine, 5 mg, Oral, Daily  cefepime, 1 g, Intravenous, Q24H  predniSONE, 50 mg, Oral, Once   Followed by  predniSONE, 50 mg, Oral, Once   Followed by  predniSONE, 50 mg, Oral, Once   Followed by  diphenhydrAMINE, 50 mg, Oral, Once  heparin (porcine), 5,000 Units, Subcutaneous, Q12H SCH  insulin lispro, 2-10 Units, Subcutaneous, Q4H SCH  metroNIDAZOLE, 500 mg, Intravenous, Q8H  thiamine, 100 mg, Intravenous, Q24H SCH          Continuous Infusions:     benzocaine, 1 spray, TID PRN  dextrose, 15 g of glucose, PRN   Or  dextrose, 12.5 g, PRN   Or  dextrose, 12.5 g, PRN   Or  glucagon (rDNA), 1 mg, PRN  diphenhydrAMINE, 25 mg, Q6H PRN  labetalol, 10 mg, Q2H PRN  lidocaine 2% jelly, , Once PRN  naloxone,  0.2 mg, PRN  phenol, 1 spray, Q2H PRN              Labs:     Recent Labs   Lab 07/05/22  0237 07/04/22  0432 07/03/22  0347   WBC 22.46* 19.25* 13.59*   Hemoglobin 7.9* 8.0* 7.6*   Hematocrit 24.4* 24.2* 22.7*   Platelet Count 213 174 129*     Recent Labs   Lab 07/05/22  0237 07/04/22  0432 07/03/22  1412 07/03/22  0347 07/02/22  1749 07/02/22  1122   Sodium 143 141 137 136 137 138   Potassium 4.4 3.9 3.5 4.3 4.5 4.4   Chloride 106 104 103 106 105 105   CO2 23 24 25 23 26 26    BUN 50* 29* 15 23* 13 14   Creatinine 4.0* 2.6* 1.4* 1.9* 1.2* 1.2*   Calcium 7.6* 7.9 8.3 10.4* 10.6* 10.3*   Albumin  --   --   --  1.4*  1.4* 1.4* 1.4*   Phosphorus 3.4 2.0* 1.6* 1.3* 1.4* 1.1*   Magnesium 2.2 2.1 1.9 2.4  --   --    Glucose 200* 206* 269* 238* 196* 197*   GFR 10.5* 17.5* 36.9* 25.6* 44.4* 44.4*           Signed by: Debara Pickett, MD, MD

## 2022-07-05 NOTE — Progress Notes (Signed)
[facility] PROGRESS NOTE      Date of Note: 07/05/2022   Patient Name: Shelby Barron     Date of Birth:  [birth year]     MRN:               [id number]     PCP:               Patsy Lager, MD     I am available on epic chart and Spectra link extension [phone] Monday - Friday 8:30-16:30. If urology consultation is needed outside these hours please contact [facility] at [phone].      ASSESSMENT/  PLAN     -Continue supportive care, appreciate ICU and general surgery input  -no additional urology recs    I will discuss/discussed the plan of care with the following services:  Dr. Astrid Drafts, Urology   Family at bedside     I spent a total of 10 minutes involved in this patient's care    Sheldon Silvan, PA  07/05/2022, 3:23 PM    SUBJECTIVE     Interval History: 6/10 radical cystectomy and bilateral nephrectomy with enterotomy  6/12 abdominal wound closure, bowel anastamosis,    Continues to improve medically, but still not very communicative.  Nutrition planning.  [name]      Patient Active Problem List    Diagnosis Date Noted    Anemia 06/30/2022    COPD (chronic obstructive pulmonary disease) 06/30/2022    Bladder cancer 06/28/2022    Malignant neoplasm of urinary bladder, unspecified site 06/25/2022    Malignant tumor of kidney, left 06/25/2022    GERD (gastroesophageal reflux disease) 06/25/2022    Leukocytosis 06/25/2022    Chronic diastolic congestive heart failure 06/23/2022    Aortic atherosclerosis 06/23/2022    Peritonitis 05/26/2022    ESRD (end stage renal disease) 05/26/2022    Hamstring injury 05/10/2013    Benign essential hypertension 02/10/2013    Left renal mass 11/28/2012    Type 2 diabetes mellitus with kidney complication, with long-term current use of insulin 07/31/2010    Diabetes mellitus type II, uncontrolled 07/31/2010    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        OBJECTIVE     Vital Signs: BP  196/84   Pulse 100   Temp 98.3 F (36.8 C) (Axillary)   Resp (!) 33   Ht 1.575 m (5\' 2" )   Wt 87.8 kg (193 lb 9 oz)   SpO2 97%   BMI 35.40 kg/m     TMax: Temp (24hrs), Avg:98.6 F (37 C), Min:98.3 F (36.8 C), Max:99.1 F (37.3 C)    I/Os:   Intake/Output Summary (Last 24 hours) at 07/05/2022 1523  Last data filed at 07/05/2022 0400  Gross per 24 hour   Intake 100 ml   Output 10 ml   Net 90 ml             LABS & IMAGING     CBC  Recent Labs     07/05/22  0237 07/04/22  0432 07/03/22  0347   WBC 22.46* 19.25* 13.59*   RBC 2.63* 2.63* 2.49*   Hematocrit 24.4* 24.2* 22.7*   MCV 92.8 92.0 91.2   MCH 30.0 30.4 30.5   MCHC 32.4 33.1 33.5   RDW 15 15 15    MPV 10.0 10.2 10.5       BMP  Recent Labs     07/05/22  0237 07/04/22  0432 07/03/22  1412   CO2 23 24 25    Anion Gap 14.0 13.0 9.0   BUN 50* 29* 15   Creatinine 4.0* 2.6* 1.4*       INR  Lab Results   Component Value Date/Time    INR 1.1 07/01/2022 11:20 AM    INR 1.1 06/01/2022 03:15 AM         IMAGING:  XR Abdomen AP   Final Result      Enteric tube terminates in the distal stomach.      Elease Etienne, DO   07/05/2022 12:24 PM      XR Chest AP Portable   Final Result         1. Lines and tubes in adequate position. No pneumothorax.   2. Trace left pleural effusion and bibasilar atelectasis.      Jasmine December D'Heureux, MD   06/30/2022 4:53 PM      XR Chest AP Portable   Final Result      Catheters as described.      Pankaj Dominica, MD   06/30/2022 2:07 PM      XR Chest AP Portable   Final Result    Right mainstem bronchial intubation. The tube should be   withdrawn about 4 cm.      Dr. Tildon Husky is aware.      Wynema Birch, MD   06/28/2022 4:56 PM      XR OR MISCOUNT   Final Result    No unexpected foreign body.      These critical results were discussed with and acknowledged by Angie R.,   the circulating nurse, who will communicate the findings to Everardo All,   MD on 06/28/2022 3:02 PM.         Bosie Helper, MD   06/28/2022 3:03 PM      CT Abdomen Pelvis W IV And PO Cont     (Results Pending)   CT Head WO Contrast    (Results Pending)

## 2022-07-05 NOTE — Progress Notes (Addendum)
ENvue Electro-Magnetic Feeding Tube Placement  ENvue Feeding tube placed:   []  Nasogastric [x]  Nasoduodenal []  Nasojejunal  []  Orogastric  []  Oroduodenal  []  Orojejunal         Tube secured at 78 cm. Stylet removed [x]     Achieved desired location based on:   [x]  Frontal view with tube tip at least traverses the RUQ field   [x]  Lateral view demonstrates posterior drop    [x]  Axial view begins to create an oval shape (complete oval only expected if placed into jejunum)   [x]  Negative pressure achieved with syringe suctioning of feeding tube    Securement Method:   []  Tape   []  Nose guard   [x]  AMT Nasal Bridle (contact superuser for removal)      [x]  AXR order placed for confirmation  [x]  Feeding tube placement documented in EHR/Avatar      Feeding tube is ready for use once placement confirmed by XR. For any concerns regarding feeding tube positioning, please call [phone] prior to ordering XR to confirm positioning, equipment can be used to confirm and adjust positioning if needed. If bridle in place, contact Actuary (ICU RD, ICU Charge RN) for removal.

## 2022-07-06 ENCOUNTER — Inpatient Hospital Stay: Payer: No Typology Code available for payment source

## 2022-07-06 DIAGNOSIS — E1121 Type 2 diabetes mellitus with diabetic nephropathy: Secondary | ICD-10-CM

## 2022-07-06 DIAGNOSIS — C642 Malignant neoplasm of left kidney, except renal pelvis: Secondary | ICD-10-CM

## 2022-07-06 DIAGNOSIS — I5032 Chronic diastolic (congestive) heart failure: Secondary | ICD-10-CM

## 2022-07-06 DIAGNOSIS — I480 Paroxysmal atrial fibrillation: Secondary | ICD-10-CM

## 2022-07-06 DIAGNOSIS — Z794 Long term (current) use of insulin: Secondary | ICD-10-CM

## 2022-07-06 LAB — RENAL FUNCTION PANEL
Albumin: 1.3 g/dL — ABNORMAL LOW (ref 3.5–5.0)
Anion Gap: 12 (ref 5.0–15.0)
BUN: 79 mg/dL — ABNORMAL HIGH (ref 7–21)
CO2: 21 mEq/L (ref 17–29)
Calcium: 7.7 mg/dL — ABNORMAL LOW (ref 7.9–10.2)
Chloride: 106 mEq/L (ref 99–111)
Creatinine: 5.3 mg/dL — ABNORMAL HIGH (ref 0.4–1.0)
GFR: 7.5 mL/min/{1.73_m2} — ABNORMAL LOW (ref >=60.0)
Glucose: 210 mg/dL — ABNORMAL HIGH (ref 70–100)
Phosphorus: 4.2 mg/dL (ref 2.3–4.7)
Potassium: 4.5 mEq/L (ref 3.5–5.3)
Sodium: 139 mEq/L (ref 135–145)

## 2022-07-06 LAB — MAGNESIUM: Magnesium: 2.3 mg/dL (ref 1.6–2.6)

## 2022-07-06 LAB — BASIC METABOLIC PANEL
Anion Gap: 13 (ref 5.0–15.0)
BUN: 49 mg/dL — ABNORMAL HIGH (ref 7–21)
CO2: 23 mEq/L (ref 17–29)
Calcium: 7.8 mg/dL — ABNORMAL LOW (ref 7.9–10.2)
Chloride: 103 mEq/L (ref 99–111)
Creatinine: 3.4 mg/dL — ABNORMAL HIGH (ref 0.4–1.0)
GFR: 12.7 mL/min/{1.73_m2} — ABNORMAL LOW (ref >=60.0)
Glucose: 200 mg/dL — ABNORMAL HIGH (ref 70–100)
Potassium: 3.8 mEq/L (ref 3.5–5.3)
Sodium: 139 mEq/L (ref 135–145)

## 2022-07-06 LAB — WHOLE BLOOD GLUCOSE POCT
Whole Blood Glucose POCT: 225 mg/dL — ABNORMAL HIGH (ref 70–100)
Whole Blood Glucose POCT: 183 mg/dL — ABNORMAL HIGH (ref 70–100)
Whole Blood Glucose POCT: 158 mg/dL — ABNORMAL HIGH (ref 70–100)
Whole Blood Glucose POCT: 194 mg/dL — ABNORMAL HIGH (ref 70–100)
Whole Blood Glucose POCT: 179 mg/dL — ABNORMAL HIGH (ref 70–100)

## 2022-07-06 LAB — CBC
Absolute nRBC: 0.04 10*3/uL — ABNORMAL HIGH (ref <=0.00)
Hematocrit: 22.6 % — ABNORMAL LOW (ref 34.7–43.7)
Hemoglobin: 7.4 g/dL — ABNORMAL LOW (ref 11.4–14.8)
MCH: 31 pg (ref 25.1–33.5)
MCHC: 32.7 g/dL (ref 31.5–35.8)
MCV: 94.6 fL (ref 78.0–96.0)
MPV: 10.5 fL (ref 8.9–12.5)
Platelet Count: 281 10*3/uL (ref 142–346)
RBC: 2.39 10*6/uL — ABNORMAL LOW (ref 3.90–5.10)
RDW: 15 % (ref 11–15)
WBC: 23.06 10*3/uL — ABNORMAL HIGH (ref 3.10–9.50)
nRBC %: 0.2 /100 WBC — ABNORMAL HIGH (ref <=0.0)

## 2022-07-06 MED ORDER — DIPHENHYDRAMINE HCL 50 MG/ML IJ SOLN
12.5000 mg | Freq: Once | INTRAMUSCULAR | Status: AC
Start: 2022-07-06 — End: 2022-07-06
  Administered 2022-07-06: 12.5 mg via INTRAVENOUS
  Filled 2022-07-06: qty 1

## 2022-07-06 MED ORDER — ACETAMINOPHEN 325 MG PO TABS
650.0000 mg | ORAL_TABLET | Freq: Four times a day (QID) | ORAL | Status: DC
Start: 2022-07-06 — End: 2022-07-17
  Administered 2022-07-06 – 2022-07-17 (×44): 650 mg via ORAL
  Filled 2022-07-06 (×45): qty 2

## 2022-07-06 MED ORDER — MEROPENEM 500 MG MBP (CNR)
500.0000 mg | Status: AC
Start: 2022-07-06 — End: 2022-07-20
  Administered 2022-07-06 – 2022-07-20 (×15): 500 mg via INTRAVENOUS
  Filled 2022-07-06 (×15): qty 100

## 2022-07-06 MED ORDER — IOHEXOL 350 MG/ML IV SOLN
100.0000 mL | Freq: Once | INTRAVENOUS | Status: AC | PRN
Start: 2022-07-06 — End: 2022-07-06
  Administered 2022-07-06: 100 mL via INTRAVENOUS

## 2022-07-06 MED ORDER — METOPROLOL TARTRATE 5 MG/5ML IV SOLN
5.0000 mg | Freq: Once | INTRAVENOUS | Status: AC
Start: 2022-07-06 — End: 2022-07-06
  Administered 2022-07-06: 5 mg via INTRAVENOUS
  Filled 2022-07-06: qty 5

## 2022-07-06 MED ORDER — METOPROLOL TARTRATE 25 MG PO TABS
25.0000 mg | ORAL_TABLET | Freq: Two times a day (BID) | ORAL | Status: DC
Start: 2022-07-06 — End: 2022-07-08
  Administered 2022-07-06 – 2022-07-07 (×4): 25 mg via ORAL
  Filled 2022-07-06 (×4): qty 1

## 2022-07-06 MED ORDER — ALBUTEROL-IPRATROPIUM 2.5-0.5 (3) MG/3ML IN SOLN
3.0000 mL | Freq: Four times a day (QID) | RESPIRATORY_TRACT | Status: DC | PRN
Start: 2022-07-06 — End: 2022-07-07
  Administered 2022-07-07: 3 mL via RESPIRATORY_TRACT
  Filled 2022-07-06: qty 3

## 2022-07-06 MED ORDER — TRAMADOL HCL 50 MG PO TABS
50.0000 mg | ORAL_TABLET | Freq: Once | ORAL | Status: AC
Start: 2022-07-06 — End: 2022-07-06
  Administered 2022-07-06: 50 mg via ORAL
  Filled 2022-07-06: qty 1

## 2022-07-06 MED ORDER — PREDNISONE 20 MG PO TABS
50.0000 mg | ORAL_TABLET | Freq: Once | ORAL | Status: AC
Start: 2022-07-06 — End: 2022-07-06
  Administered 2022-07-06: 50 mg via ORAL
  Filled 2022-07-06: qty 1

## 2022-07-06 MED ORDER — OXYCODONE HCL 5 MG PO TABS
5.0000 mg | ORAL_TABLET | Freq: Four times a day (QID) | ORAL | Status: DC | PRN
Start: 2022-07-06 — End: 2022-07-11
  Administered 2022-07-06 – 2022-07-11 (×17): 5 mg via ORAL
  Filled 2022-07-06 (×18): qty 1

## 2022-07-06 MED ORDER — SODIUM CHLORIDE 0.9 % IV BOLUS
250.0000 mL | INTRAVENOUS | Status: AC | PRN
Start: 2022-07-06 — End: 2022-07-06

## 2022-07-06 MED ORDER — ALBUMIN HUMAN 25 % IV SOLN
100.0000 mL | INTRAVENOUS | Status: AC | PRN
Start: 2022-07-06 — End: 2022-07-06

## 2022-07-06 MED ORDER — PREDNISONE 20 MG PO TABS
50.0000 mg | ORAL_TABLET | Freq: Every morning | ORAL | Status: DC
Start: 2022-07-06 — End: 2022-07-06

## 2022-07-06 MED ORDER — METOPROLOL TARTRATE 5 MG/5ML IV SOLN
2.5000 mg | Freq: Once | INTRAVENOUS | Status: AC
Start: 2022-07-06 — End: 2022-07-06
  Administered 2022-07-06: 2.5 mg via INTRAVENOUS
  Filled 2022-07-06: qty 5

## 2022-07-06 MED ORDER — ALBUTEROL-IPRATROPIUM 2.5-0.5 (3) MG/3ML IN SOLN
3.0000 mL | Freq: Four times a day (QID) | RESPIRATORY_TRACT | Status: DC
Start: 2022-07-06 — End: 2022-07-06
  Filled 2022-07-06: qty 3

## 2022-07-06 MED ORDER — SODIUM CHLORIDE 0.9 % IV BOLUS
100.0000 mL | INTRAVENOUS | Status: AC | PRN
Start: 2022-07-06 — End: 2022-07-06

## 2022-07-06 NOTE — Treatment Plan (Cosign Needed)
4 eyes in 4 hours pressure injury assessment note:      Completed with:   Unit & Time admitted:              Bony Prominences: Check appropriate box; if wound is present enter wound assessment in LDA     Occiput:                 [x] WNL  []  Wound present  Face:                     [x] WNL  []  Wound present  Ears:                      [x] WNL  []  Wound present  Spine:                    [x] WNL  []  Wound present  Shoulders:             [x] WNL  []  Wound present  Elbows:                  [x] WNL  []  Wound present  Sacrum/coccyx:     [x] WNL  []  Wound present  Ischial Tuberosity:  [x] WNL  []  Wound present  Trochanter/Hip:      [x] WNL  []  Wound present  Knees:                   [x] WNL  []  Wound present  Ankles:                   [x] WNL  []  Wound present  Heels:                    [x] WNL  []  Wound present  Other pressure areas:  []  Wound location  Open Abdomen - Wound Vac      Device related: []  Device name:         LDA completed if wound present: yes/no  Consult WOCN if necessary    Other skin related issues, ie tears, rash, etc, document in Integumentary flowsheet

## 2022-07-06 NOTE — Progress Notes (Signed)
GENERAL SURGERY  PROGRESS NOTE    Date Time: 07/06/22 1:20 PM  Patient Name: [name]    ASSESSMENT:     Patient Active Problem List   Diagnosis    Type 2 diabetes mellitus with kidney complication, with long-term current use of insulin    Diabetes mellitus type II, uncontrolled    Neuropathy of hand    Left renal mass    Hamstring injury    Peritonitis    ESRD (end stage renal disease)    Chronic diastolic congestive heart failure    Aortic atherosclerosis    Malignant neoplasm of urinary bladder, unspecified site    Malignant tumor of kidney, left    Benign essential hypertension    GERD (gastroesophageal reflux disease)    Leukocytosis    Bladder cancer    Anemia    COPD (chronic obstructive pulmonary disease)       Shelby Barron is a 82 y.o. female with bladder cancer and renal failure admitted for cystectomy with bilateral nephrectomy complicated by enterotomy requiring small bowel resection in discontinuity and negative pressure temporary abdominal closure given pressure support and need for resuscitation. Returned to the OR 6/12 for re-anastomosis and abdominal closure.  SLP with recommendation for n.p.o.  Concern for vaginal discharge, discussed with urology, this is anticipated given partial vaginectomy.  Patient had about bowel movement yesterday.  Given increasing leukocytosis and episodes of tachycardia will obtain CT abdomen pelvis with IV and p.o. contrast w/no e/o anastomotic leak or intra-abdominal abscess. Patient currently has a pneumonia.     PLAN:   - Multimodal pain control   - Wound care ostomy nursing consultation  - Nutrition consulted, on tube feedings   - Transfuse PRN for anemia  - Wound VAC  - Abx: zosyn per ID  - Remainder of care per primary team     Addedum: I have personally seen and examined this patient on 07/06/22. I agree with the above information, including the exam, history, and plan as documented.  I have made any necessary corrections to the  note.    [name] [name], MD  [facility]  Office: [phone]      SUBJECTIVE:   No acute overnight events   Remains encephalopathic despite negative head CT   Family concerned her pain is not well controlled      Objective:   Current Vitals:   Vitals:    07/06/22 1317   BP: 120/60   Pulse: (!) 143   Resp:    Temp:    SpO2:        Intake and Output Summary (Last 24 hours):  I/O last 3 completed shifts:  In: 1500 [P.O.:1000; I.V.:20; NG/GT:180; IV Piggyback:300]  Out: 40 [Other:40]    Labs:     Results       Procedure Component Value Units Date/Time    Whole Blood Glucose POCT [[id number]]  (Abnormal) Collected: 07/06/22 1144    Specimen: Blood, Capillary Updated: 07/06/22 1146     Whole Blood Glucose POCT 158 mg/dL     Whole Blood Glucose POCT [[id number]]  (Abnormal) Collected: 07/06/22 0802    Specimen: Blood, Capillary Updated: 07/06/22 0804     Whole Blood Glucose POCT 183 mg/dL     Magnesium [[id number]]  (Normal) Collected: 07/06/22 0341    Specimen: Blood, Venous Updated: 07/06/22 0411     Magnesium 2.3 mg/dL     Renal Function Panel [[id number]]  (Abnormal)  Collected: 07/06/22 0341    Specimen: Blood, Venous Updated: 07/06/22 0411     Glucose 210 mg/dL      Sodium 161 mEq/L      Potassium 4.5 mEq/L      Chloride 106 mEq/L      CO2 21 mEq/L      BUN 79 mg/dL      Calcium 7.7 mg/dL      Creatinine 5.3 mg/dL      Albumin 1.3 g/dL      Phosphorus 4.2 mg/dL      Anion Gap 09.6     GFR 7.5 mL/min/1.73 m2     Whole Blood Glucose POCT [[id number]]  (Abnormal) Collected: 07/06/22 0356    Specimen: Blood, Capillary Updated: 07/06/22 0358     Whole Blood Glucose POCT 225 mg/dL     CBC without Differential [[id number]]  (Abnormal) Collected: 07/06/22 0341    Specimen: Blood, Venous Updated: 07/06/22 0354     WBC 23.06 x10 3/uL      Hemoglobin 7.4 g/dL      Hematocrit 21.3 %      Platelet Count 281 x10 3/uL      MPV 10.5 fL      RBC 2.39 x10 6/uL      MCV 94.6 fL      MCH 31.0 pg      MCHC 32.7 g/dL       RDW 15 %      nRBC % 0.2 /100 WBC      Absolute nRBC 0.04 x10 3/uL     Whole Blood Glucose POCT [[id number]]  (Abnormal) Collected: 07/05/22 2330    Specimen: Blood, Capillary Updated: 07/05/22 2334     Whole Blood Glucose POCT 207 mg/dL     Whole Blood Glucose POCT [[id number]]  (Abnormal) Collected: 07/05/22 1940    Specimen: Blood, Capillary Updated: 07/05/22 1943     Whole Blood Glucose POCT 209 mg/dL     Culture, Sputum and Lower Respiratory [[id number]]  (Abnormal) Collected: 07/04/22 1034    Specimen: Sputum, Suctioned Updated: 07/05/22 1750     Culture Respiratory Heavy growth of Mixed upper respiratory flora      Light growth of Gram negative rod     Gram Stain Few WBCs      Rare Squamous epithelial cells      Few Mixed respiratory flora    Whole Blood Glucose POCT [[id number]]  (Abnormal) Collected: 07/05/22 1606    Specimen: Blood, Capillary Updated: 07/05/22 1610     Whole Blood Glucose POCT 195 mg/dL             Rads:     Radiology Results (24 Hour)       Procedure Component Value Units Date/Time    CT Abdomen Pelvis W IV And PO Cont [[id number]] Collected: 07/06/22 0528    Order Status: Completed Updated: 07/06/22 0553    Narrative:      HISTORY: Evaluate for intra-abdominal pathology. Leukocytosis after small  bowel resection, bilateral nephrectomy and cystectomy.    COMPARISON: CT chest abdomen pelvis 05/26/2022    TECHNIQUE: CT abdomen and pelvis WITH intravenous contrast. The following  dose reduction techniques were utilized: Automated exposure control and/or  adjustment of the mA and/or kV according to patient size, and the use of  iterative reconstruction technique.    CONTRAST: iohexol (OMNIPAQUE) 350 MG/ML injection 100 mL.     FINDINGS:     The lung bases are grossly clear. There may be a trace  right pleural  effusion. There is a moderate size hiatal hernia. The abdominal aorta is  normal caliber. The liver, gallbladder, spleen, pancreas and adrenal glands  show no acute  abnormality.    Postoperative changes from bilateral nephrectomy and cystectomy. There is  slightly mixed density fluid collection in the left nephrectomy bed  measuring 5.0 x 3.1 cm, likely postoperative seroma/hematoma. Small  loculated collections of fluid and gas are seen in the anterior midline  abdomen subjacent to the midline wound. Small amount of ascites is seen  elsewhere within the abdomen and pelvis including some areas that also  appear loculated. No rim-enhancing fluid collections are identified. No  extravasation of oral contrast is seen.     An enteric tube extends to the proximal duodenum. There is no bowel wall  thickening or obstruction. There is diverticulosis. There is fluid  throughout the colon. No suspicious or destructive osseous lesion.        Impression:        1.Postoperative changes from bilateral nephrectomy and cystectomy.   2.Small amount of ascites including some areas which appear loculated.  Small collections of fluid and gas in the anterior midline abdomen may be  postoperative but superimposed infection is not excluded. No rim-enhancing  fluid collections or extravasation of oral contrast to suggest anastomotic  leak.    Demetrios Isaacs, MD  07/06/2022 5:51 AM    CT Head WO Contrast [[id number]] Collected: 07/06/22 0353    Order Status: Completed Updated: 07/06/22 0358    Narrative:      HISTORY: Altered mental status.     COMPARISON: Brain MRI 09/20/2008.    TECHNIQUE: CT of the head performed without intravenous contrast. The  following dose reduction techniques were utilized: automated exposure  control and/or adjustment of the mA and/or KV according to patient size,  and the use of an iterative reconstruction technique.    CONTRAST: None.    FINDINGS:  The ventricles and sulcal pattern are within normal limits for the  patient's stated age. There is mild-moderate supratentorial chronic  ischemic disease. No acute infarct is noted. No intracranial hemorrhage is  seen. There is  minimal paranasal sinus disease. There is moderate right  mastoid opacification. 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      Impression:         1.No acute intracranial abnormality is seen.    Leandro Reasoner, MD  07/06/2022 3:56 AM              Medications:     Current Facility-Administered Medications   Medication Dose Route Frequency    acetaminophen  650 mg Oral 4 times per day    heparin (porcine)  5,000 Units Subcutaneous Q12H [facility]    insulin lispro  2-10 Units Subcutaneous Q4H [facility]    metoprolol tartrate  25 mg Oral Q12H [facility]    piperacillin-tazobactam  2.25 g Intravenous Q8H    thiamine  100 mg Intravenous Q24H [facility]         albumin human, albuterol-ipratropium, benzocaine, dextrose **OR** dextrose **OR** dextrose **OR** glucagon (rDNA), diphenhydrAMINE, labetalol, lidocaine 2% jelly, naloxone, oxyCODONE, phenol, sodium chloride, sodium chloride     Physical Exam:     Physical Exam  Vitals reviewed.   Constitutional:       General: She is not in acute distress.     Appearance: She is ill-appearing. She is not toxic-appearing or diaphoretic.   Eyes:      General: No scleral icterus.  Cardiovascular:  Rate and Rhythm: Regular rhythm. Tachycardia present.      Pulses: Normal pulses.   Pulmonary:      Breath sounds: Wheezing present.      Comments: Increased respiratory effort   On 2L NC   Abdominal:      General: There is no distension.      Palpations: Abdomen is soft.      Tenderness: There is no abdominal tenderness.      Comments: Wound vac in place over midline holding suction, serosanguinous output in canister   Skin:     General: Skin is warm.      Coloration: Skin is not jaundiced.      Findings: No rash.   Neurological:      Mental Status: She is disoriented.       Dion Saucier, MD  General Surgery, PGY-3

## 2022-07-06 NOTE — SLP Eval Note (Signed)
[facility]    Speech Therapy Treatment Note Attempt   Patient: Shelby Barron MRN#: [id number]   Unit: MED SURG ICU 1 Room/Bed: A2910/A2910-01       Attempted to see patient at approximately 1251.        SLP Order Cancellation Reason: Provider/Medical Hold (comment required) - Provider/RN decision . Pt failed SLP on 6/16 (NPO recs made with pt mentation impacting). New eval orders placed as per chart "SLP with try again tomorrow, if not will need to be started on tube feeds."  HCT completed today was negative for acute intracranial abnormality.  New SLP orders received but not completed, as primary RN reports pt with poor arousal levels, not appropriate for SLP swallow f/u.    If appropriate/per medical POC, MD should consider a short-term, alternative means of nutrition if pt's mentation does not improve.  Will attempt again as schedule permits. Will continue to monitor for appropriateness.       07/06/2022   Gerri Lins, MS, CCC-SLP  [phone]

## 2022-07-06 NOTE — Consults (Signed)
Shelby Barron CARDIOLOGY CONSULTATION REPORT    [facility]  MED SURG ICU 1  [address]  [place] [address]  Dept: [phone]  Loc: [phone]      Date Time: 07/06/22 2:41 PM  Patient Name: Shelby Barron  Requesting Physician: Ester Rink, DO    Reason for Consultation:   Atrial fibrillation.    History of Present Illness:   Shelby Barron is a 82 y.o. female admitted on 06/28/2022.  We have been asked by Ester Rink, DO to provide cardiac consultation regarding atrial fibrillation.    The patient presented on 6/10 for scheduled cystectomy and complete nephrectomy in the setting of malignancy. Her course was complicated by intraoperative enterotomy and hemorrhage. She ultimately went back to the OR for bowel resection on 6/12.    Cardiology is consulted for atrial fibrillation, a new diagnosis. She has history of hypertension, Barron failure with preserved ejection fraction, diabetes and stroke. Her CHADS2-vasc score is 7. She was in rapid afib this morning, but converted to sinus rhythm by the time of my evaluation. She is delirious and cannot participate in her exam/interview.     She has worsening encephalopathy and gram negative rods in her respiratory culture.    Cardiology relevant medical records in Epic chart reviewed.    Past Medical/Surgical History:   Patient  has a past medical history of Acid reflux, Asthma, well controlled, Cancer of kidney, Chronic lower back pain, Congestive Barron disease, COPD (chronic obstructive pulmonary disease), CVA (cerebral infarction) (01/12/2003), Diabetes, Diabetic nephropathy, Fibroids, Gout, H/O: gout, Hyperlipidemia, Hypertension, Neuropathy of hand, and SOB (shortness of breath).    Patient  has a past surgical history that includes Hernia repair; APPENDECTOMY (OPEN); Hysterectomy; tumor removed from kidney Marletta Lor 2015); Partial nephrectomy; TURBT; Drain (Other) (N/A, 05/31/2022); Tunneled Cath Placement (Permcath) (N/A, 06/01/2022);  CYSTECTOMY, ILEOCONDUIT (N/A, 06/28/2022); LAPAROTOMY, NEPHRECTOMY (Bilateral, 06/28/2022); EXPLORATORY LAPAROTOMY, RESECTION SMALL BOWEL (N/A, 06/28/2022); WOUND VAC APPLICATION (N/A, 06/28/2022); CLOSURE, ENTEROTOMY, SMALL INTESTINE (06/28/2022); EXPLORATORY LAPAROTOMY (N/A, 06/30/2022); and WOUND VAC APPLICATION (N/A, 06/30/2022).    Family History:   Patient's family history includes Cancer in her maternal aunt, mother, and paternal aunt; Diabetes in her maternal aunt; Barron failure in her father.    Social History:   Patient  reports that she has quit smoking. She has never used smokeless tobacco. She reports that she does not drink alcohol and does not use drugs.    Allergies:     Allergies   Allergen Reactions    Metrizamide Shortness Of Breath and Nausea Only    Rosuvastatin Other (See Comments)     myopathy    myopathy   myopathy    myopathy myopathy      myopathy    myopathy, myopathy    Iodinated Contrast Media Other (See Comments)     Nausea and SOB per patient   On Dialysisi    Nausea and SOB per patient    Nausea and SOB per patient      Nausea and SOB per patient   Nausea and SOB per patient    Nausea and SOB per patient      Nausea and SOB per patient Nausea and SOB per patient      Nausea and SOB per patient Nausea and SOB per patient Nausea and SOB per patient      Nausea and SOB per patient  Nausea and SOB per patient Nausea and SOB per patient    Nausea and SOB per  patient   Nausea and SOB per patient    Pioglitazone      Weight gain    Sulfa Antibiotics Hives     blister       Medications:     Current Outpatient Medications   Medication Instructions    allopurinol (ZYLOPRIM) 100 mg, Oral, Daily    atorvastatin (LIPITOR) 40 mg, Oral, Daily    b complex vitamins capsule 1 capsule, Oral, Daily    cinacalcet (SENSIPAR) 30 mg, Oral, Daily    clopidogrel (PLAVIX) 75 mg, Oral, Daily    conjugated estrogens (PREMARIN) vaginal cream Vaginal, Daily    hyoscyamine (LEVSIN) 0.125 mg, Oral, Every 4 hours PRN     insulin aspart (NOVOLOG) 5-10 Units, Subcutaneous, As needed    Insulin Degludec (TRESIBA) 36 Units, Subcutaneous, Daily    metoprolol tartrate (LOPRESSOR) 12.5 mg, Oral, Daily    montelukast (SINGULAIR) 10 mg, Oral, At bedtime    ondansetron (ZOFRAN) 4 mg, Oral, Every 8 hours PRN    oxybutynin XL (DITROPAN-XL) 5 mg, Oral, Daily    oxyCODONE (OXY-IR) 5 mg, Oral, Every 4 hours PRN    pantoprazole (PROTONIX) 40 mg, Oral, Daily    pregabalin (LYRICA) 50 mg, Oral, Per pt has not been taking     sevelamer carbonate (RENVELA) 800 mg, Oral, 2 times daily    torsemide (DEMADEX) 100 mg, Oral, Daily    umeclidinium-vilanterol (Anoro Ellipta) 62.5-25 MCG/ACT Aerosol Pwdr, Breath Activated 1 puff, Inhalation, Daily    Zolpidem Tartrate (AMBIEN PO) 5 mg, Oral, As needed,         Inpatient Scheduled Meds: PRN Meds:    acetaminophen, 650 mg, Oral, 4 times per day  heparin (porcine), 5,000 Units, Subcutaneous, Q12H [facility]  insulin lispro, 2-10 Units, Subcutaneous, Q4H [facility]  metoprolol tartrate, 25 mg, Oral, Q12H [facility]  piperacillin-tazobactam, 2.25 g, Intravenous, Q8H  thiamine, 100 mg, Intravenous, Q24H [facility]        Continuous Infusions:   albumin human, 100 mL, PRN  albuterol-ipratropium, 3 mL, Q6H PRN  benzocaine, 1 spray, TID PRN  dextrose, 15 g of glucose, PRN   Or  dextrose, 12.5 g, PRN   Or  dextrose, 12.5 g, PRN   Or  glucagon (rDNA), 1 mg, PRN  diphenhydrAMINE, 25 mg, Q6H PRN  labetalol, 10 mg, Q2H PRN  lidocaine 2% jelly, , Once PRN  naloxone, 0.2 mg, PRN  oxyCODONE, 5 mg, Q6H PRN  phenol, 1 spray, Q2H PRN  sodium chloride, 100 mL, Q1H PRN  sodium chloride, 250 mL, PRN          Physical Exam:     Vitals:    07/06/22 1317 07/06/22 1350 07/06/22 1400 07/06/22 1409   BP: 120/60 (!) 89/65 (!) 89/52 105/69   Pulse: (!) 143 (!) 129 (!) 127 (!) 127   Resp:  18 (!) 9 (!) 31   Temp:   98.1 F (36.7 C)    TempSrc:       SpO2:  96% 96% 96%   Weight:       Height:           Constitutional: Awake, not following commands.  Neck: No carotid  bruits, JVP normal.  Cardiac: Regular rhythm, normal S1 and S2; no S3 or S4, No murmur. No rubs, no gallops.  Pulmonary: Normal work of breathing.  Extremities: no edema. Warm.    Lab/Test Findings Reviewed:     Tele: Afib earlier today. Now sinus with PACs.    Recent  Labs     07/06/22  0341   Sodium 139   Potassium 4.5   Chloride 106   CO2 21   BUN 79*   Creatinine 5.3*   Calcium 7.7*   Magnesium 2.3   WBC 23.06*   Hemoglobin 7.4*   Hematocrit 22.6*   Platelet Count 281     Echo 06/25/2022 with normal LV EF.  Mild aortic stenosis.    ASSESSMENT:   Patient is a 82 y.o. female with the following relevant diagnoses:    Paroxysmal atrial fibrillation in the setting of gram negative rod pneumonia and major abdominal surgery within the last week.  ESRD  Type 2 DM  History of CVA 2004  History of congestive Barron failure  History of renal cell carcinoma and urothelial cancer s/p cystectomy and bilateral nephrectomy (06/28/2022) complicated by enterotomy requiring small bowel resection in discontinuity. She returned to the OR on 06/30/2022 for re-anastomosis.  Leukocytosis   Hypoalbuminemia (1.3)  Encephalopathy.    RECOMMENDATIONS:     The patient has seemingly new-onset afib in the setting of major abdominal surgery, malignancy, infection and anemia. Her blood pressure is lower today than in previous days and her white blood cell count is rising (which may be partially due to prednisone she received yesterday), but may also reflect infection or post-op inflammatory state.   Fortunately, she has converted back to afib, but there is a high likelihood that she will revert to afib again. I would tolerate Barron rates into the 120s under these circumstances. Metoprolol can be used for higher rates, but would leave at 25mg  PO BID for now. Her rhythm will hopefully come under better control as her inflammatory state improves.  Her CHADS2-vasc score is elevated (7 - age, prior CVA, HTN, DM, history of HF). Anticoagulation can be  initiated when considered safe from a post-operative standpoint.  Discussed with the patient's daughter, [name], and Dr. Francisco Capuchin.  We will sign off. Please call with additional questions.    Recommendations discussed with primary medical team.  -------------------------------------------------------------------------------------  Signed by:         Elsie Amis, MD         [facility]    [facility] Contact Information   [facility]  Secure Chat (Group):   [place] [facility]    APP Spectralink:  [phone]    MD Spectralink :  [phone]  [phone]    After hours, non urgent consult line:  [phone]    After hours, physician on-call:  [phone] [facility]  Secure Chat (Group):   [place] [facility]    APP Spectralink:  [phone]    MD Spectralink :  [phone]      After hours, non urgent consult line:  [phone]    After hours, physician on-call:  [phone] [facility]  Secure Chat (Group):   [place] [facility]    APP Spectralink:  [phone]    MD Spectralink :  [phone]      After hours, non urgent consult line:  [phone]    After hours, physician on-call:  [phone] [facility]  Secure Chat (Group):   [place] [facility]    APP Spectralink:  [phone]    MD Spectralink :  [phone]      After hours, non urgent consult line:  [phone]    After hours, physician on-call:  [phone]       This note was generated by the Dragon speech recognition and may contain errors or omissions not  intended by the user. Grammatical errors, random word insertions, deletions, pronoun errors, and incomplete sentences are occasional consequences of this technology due to software limitations. Not all errors are caught or corrected. If there are questions or concerns about the content of this note or information contained within the body of this dictation, they should be addressed directly with the author for clarification.

## 2022-07-06 NOTE — Consults (Signed)
[facility] ICU  Consult      Patient Name: Shelby Barron  MRN: [id number]  Room: A2910/A2910-01  Code Status: Full Code      Assessment & Plan   MSICU Attending Assessment/Plan:    I have seen and examined the patient, reviewed labs and imaging and discussed the plan with APP, bedside RN and hospitalist.     The advanced practice provider, Rubin Payor,   performed the substantive portion of this visit by providing more than 50% of the total time. I reviewed and updated the documented findings and plan of care accordingly as noted.    APP time: As documented in the APP portion of the note (seen independently)  Physician time: 30 minutes (independently)  Rounding time: 5 minutes (minutes together by the physician and APP - added to the physician time)    Total care time: 85 minutes   Attending Physician: Domingo Dimes, MD    Date: 07/06/2022 4:44 PM          Billing:             Chief Complaint / Primary Reason for MSICU Evaluation     AF RVR     History of Presenting Illness   Shelby Barron is a 82 y.o. female with Hx of COPD, prior CVA (2004), T2DM, renal cell carcinoma status post partial bilateral nephrectomy, [name], and recent diagnosis of urothelial cancer.  Patient initially admitted on 6/10 for scheduled cystectomy and complete nephrectomy.  OR course was complicated by small bowel enterotomy and significant intraoperative bleeding (1 L EBL), patient was admitted to ICU in discontinuity with open abdomen.  Patient's hemorrhagic shock was treated with blood transfusions, also treated with broad-spectrum antibiotics. Patient went to the OR on 6/12 for small bowel anastomosis and closure with wound VAC placement.  Patient weaned off pressors and extubated to nasal cannula pm 6/13. Initially patient required CRRT due to shock, decision to IHD on 6/15.  Course was further complicated by delirium and encephalopathy.  Patient downgraded out of ICU on 6/16.  Patient with worsening encephalopathy,  respiratory culture positive for GNR's patient treated for pneumonia.    On 6/18 patient was undergoing HD session and developed A-fib RVR with rates to 150s and hypotensive to 89/52 with MAP of 64, dialysis session was terminated, a total of 730 cc of fluid was removed.  Patient was treated with 2.5 mg IV metoprolol and given 25 mg p.o. metoprolol.  ICU called for evaluation.  On evaluation patient is obtunded, eyes are open spontaneously and patient groans in response to noxious stimuli but is otherwise unresponsive, cough is present, POCUS with hyperdynamic cardiac function, difficult to visualize IVC due to abdominal tenderness postoperatively.  Lung sounds are coarse with scattered rhonchi.  Patient is in A-fib with rate 120s to 130s, improved to SBP 130s after HD was terminated.    Subjective   Past Medical History:     Past Medical History:   Diagnosis Date    Acid reflux     Asthma, well controlled     Cancer of kidney     Chronic lower back pain     Congestive heart disease     COPD (chronic obstructive pulmonary disease)     CVA (cerebral infarction) 01/12/2003    Diabetes     Diabetic nephropathy     Fibroids     Gout     H/O: gout     Hyperlipidemia  Hypertension     Neuropathy of hand     SOB (shortness of breath)        Past Surgical History:     Past Surgical History:   Procedure Laterality Date    APPENDECTOMY (OPEN)      CLOSURE, ENTEROTOMY, SMALL INTESTINE  06/28/2022    Procedure: CLOSURE, ENTEROTOMY, SMALL INTESTINE;  Surgeon: Marlaine Hind, MD;  Location: [facility];  Service: General;;    CYSTECTOMY, ILEOCONDUIT N/A 06/28/2022    Procedure: CYSTECTOMY, RADICAL;  Surgeon: Neldon Newport, MD;  Location: [facility];  Service: Urology;  Laterality: N/A;    DRAIN (OTHER) N/A 05/31/2022    Procedure: DRAIN (OTHER);  Surgeon: Hope Pigeon, MD;  Location: [facility];  Service: Interventional Radiology;  Laterality: N/A;    EXPLORATORY LAPAROTOMY N/A 06/30/2022    Procedure: SMALL BOWEL  ANASTOMOSIS, CLOSURE OF MESENTERIC DEFECT, CLOSURE OF ABDOMINAL WALL;  Surgeon: Marlaine Hind, MD;  Location: [facility];  Service: General;  Laterality: N/A;  **IP-2910.01/ MD AVAIL [id number] OR AFTER 1600**    EXPLORATORY LAPAROTOMY, RESECTION SMALL BOWEL N/A 06/28/2022    Procedure: RESECTION SMALL BOWEL;  Surgeon: Marlaine Hind, MD;  Location: [facility];  Service: General;  Laterality: N/A;    HERNIA REPAIR      HYSTERECTOMY      LAPAROTOMY, NEPHRECTOMY Bilateral 06/28/2022    Procedure: BILATERAL OPEN RADICAL NEPHRECTOMY AND LYSIS OF ASHESION;  Surgeon: Neldon Newport, MD;  Location: [facility];  Service: Urology;  Laterality: Bilateral;    PARTIAL NEPHRECTOMY      tumor removed from kidney  cJanuary 2015    TUNNELED CATH PLACEMENT (PERMCATH) N/A 06/01/2022    Procedure: TUNNELED CATH PLACEMENT;  Surgeon: Suszanne Finch, MD;  Location: [facility];  Service: Interventional Radiology;  Laterality: N/A;    TURBT      WOUND VAC APPLICATION N/A 06/28/2022    Procedure: Tawni Pummel WOUND VAC APPLICATION;  Surgeon: Marlaine Hind, MD;  Location: [facility];  Service: General;  Laterality: N/A;    WOUND VAC APPLICATION N/A 06/30/2022    Procedure: WOUND VAC APPLICATION;  Surgeon: Marlaine Hind, MD;  [facility]: [facility];  Service: General;  Laterality: N/A;       Family History:     Family History   Problem Relation Age of Onset    Cancer Mother     Heart failure Father     Cancer Maternal Aunt     Diabetes Maternal Aunt     Cancer Paternal Aunt        Social History:     Social History     Socioeconomic History    Marital status: Married     Spouse name: Not on file    Number of children: Not on file    Years of education: Not on file    Highest education level: Not on file   Occupational History    Not on file   Tobacco Use    Smoking status: Former    Smokeless tobacco: Never   Vaping Use    Vaping status: Never Used   Substance and Sexual Activity    Alcohol use: No    Drug use: No    Sexual activity: Not on  file   Other Topics Concern    Not on file   Social History Narrative    Not on file     Social Determinants of Psychologist, prison and probation services  Strain: Not on file   Food Insecurity: No Food Insecurity (05/26/2022)    Hunger Vital Sign     Worried About Running Out of Food in the Last Year: Never true     Ran Out of Food in the Last Year: Never true   Transportation Needs: No Transportation Needs (05/26/2022)    PRAPARE - Therapist, art (Medical): No     Lack of Transportation (Non-Medical): No   Physical Activity: Not on file   Stress: Not on file   Social Connections: Unknown (02/01/2022)    Received from [facility]    Social Network     Social Network: Not on file   Intimate Partner Violence: Not At Risk (05/26/2022)    Humiliation, Afraid, Rape, and Kick questionnaire     Fear of Current or Ex-Partner: No     Emotionally Abused: No     Physically Abused: No     Sexually Abused: No   Housing Stability: Unknown (05/26/2022)    Housing Stability Vital Sign     Unable to Pay for Housing in the Last Year: No     Number of Places Lived in the Last Year: Not on file     Unstable Housing in the Last Year: No       Allergies:     Allergies   Allergen Reactions    Metrizamide Shortness Of Breath and Nausea Only    Rosuvastatin Other (See Comments)     myopathy    myopathy   myopathy    myopathy myopathy      myopathy    myopathy, myopathy    Iodinated Contrast Media Other (See Comments)     Nausea and SOB per patient   On Dialysisi    Nausea and SOB per patient    Nausea and SOB per patient      Nausea and SOB per patient   Nausea and SOB per patient    Nausea and SOB per patient      Nausea and SOB per patient Nausea and SOB per patient      Nausea and SOB per patient Nausea and SOB per patient Nausea and SOB per patient      Nausea and SOB per patient  Nausea and SOB per patient Nausea and SOB per patient    Nausea and SOB per patient   Nausea and SOB per patient    Pioglitazone      Weight gain     Sulfa Antibiotics Hives     blister          Objective   Physical Examination   Vitals Temp:  [97.8 F (36.6 C)-98.8 F (37.1 C)]   Heart Rate:  [80-183]   Resp Rate:  [9-47]   BP: (89-177)/(52-129)   SpO2:  [95 %-100 %]   Weight:  [93.6 kg (206 lb 5.6 oz)]   BMI (calculated):  [37.7]  // Temperature with 24 range  Vent Settings      General: Ill-appearing female    Neuro:    Obtunded, responsive to noxious stimuli, protecting airway    Lungs:   Coarse lung sounds bilaterally with scattered rhonchi left greater than right    Cardiac:   Tachycardic, irregular    Abdomen:    mild tenderness to palpation, no guarding    Extremities:   Warm, no edema    Skin:     Surgical incision CDI,     Assessment  Patient has BMI=Body mass index is 37.74 kg/m.  Diagnosis: Obesity based on BMI criteria         Recent Labs   Lab 07/06/22  0341 07/05/22  0237 07/04/22  0432   Hemoglobin 7.4* 7.9* 8.0*   Hematocrit 22.6* 24.4* 24.2*   MCV 94.6 92.8 92.0   WBC 23.06* 22.46* 19.25*   Platelet Count 281 213 174     Recent Labs   Lab 06/02/22  0514   Iron 62   TIBC 145*   Iron Saturation 43     Anemia Diagnosis: Acute: Acute Blood Loss Anemia on Chronic: Anemia of Chronic Kidney Disease and Anemia of Neoplastic Disease   Malnutrition Documentation    Moderate Malnutrition related to inadequate nutritional intake in the setting of acute illness as evidenced by <50% EER > 5 days, mild muscle losses (temporalis, deltoid) - new               Neuro   Acute metabolic encephalopathy  Delirium  History of CVA    Cardiovascular   A-fib RVR  Hypotension  History of hypertension    Pulmonary  Pneumonia  Acute hypoxic respiratory failure  History of COPD    Renal  S/p cystectomy and complete nephrectomy  [name]    GI  Enterotomy s/p resection    Infections Disease  Pneumonia    Hematology/Onc  Renal cell carcinoma  Urothelial carcinoma  Thrombocytopenia    Endo/Rheum  Type 2 diabetes    ICU Checklist  Sedation:   CAM-ICU: Positive or Negative  for Delirium: Positive   CAM ICU:   Last Documented RASS: RASS Score: Drowsy   Currently ordered infusions:    Reviewed: Yes   Mobility:   Current Mobility Level:   PMP Activity: Step 3 - Bed Mobility (07/06/2022 12:00 PM)    Current PT Order: No  Current OT Order: No Reviewed: Yes   Respiratory (n/a if blank):   Ventilator Time:    Last Recorded Vent Mode:    Reviewed: Yes   Gastrointenstinal  Last Bowel Movement:   Last BM Date: 07/05/22 Reviewed: Yes   CAUTI Prevention (n/a if blank):  Foley Day:        Reviewed: Yes   Blood Steam Infection Prevention (n/a if blank):   Permacath Catheter - Tunneled 06/01/22 Internal Jugular Right  Temporary Catheter with Pigtail 06/30/22 Temporary Catheter Right Reviewed: Yes   DVT Chemoprophylaxis (none if blank):   heparin (porcine) injection 5,000 Units  Reviewed: Yes            Plan     NEUROLOGICAL:   # Acute metabolic encephalopathy  # Delirium  # History of CVA  CTH 6/18 negative for acute findings.  Worsening encephalopathy in setting of infection and ICU delirium  - Delirium precautions, CAM ICU monitoring   - Home plavix on hold   - Tylenol and PRN oxycodone for pain control, minimize opioids as able give encephalopathy  - Avoid sedating agents  - Palliative care consulted for prolonged hospitalization with multiple co-morbilities (Dr. Phineas Semen)     CARDIOVASCULAR:   # Paroxysmal A-fib RVR  # Hypotension, resolved   # History of hypertension  Brief episode of A-fib RVR during dialysis session, converted back to normal sinus rhythm after 5 mg IV metoprolol given.  Brief episode of hypotension while undergoing HD with AF RVR, SBP now improved. Concern for worsening infection as underlying etiology.   - Continuous telemonitoring  - MAP goal >  65   - PRN anti-HTN, hold PO meds given labile BP   - Does not need for scheduled PO metoprolol given no history of AF, seems to be a provoked paroxysmal episode which is now resolved, patient in NSR rate 70-80s     PULMONARY:   # Acute  hypoxic respiratory failulre   # PNA   CXR with bibasilar opacities  - Abx as below   - 2L NC, wean as able   - Aspiration precautions     RENAL:   # ESRD on iHD   # Renal cell carcinoma  # Urothelial carcinoma  - Nephrology following   - Trend renal function   - HD session 6/18 terminated early     GASTROINTESTINAL:  - NGT for nutrition, failed SLP   - May benefit from bowel regimen, not currently ordered    INFECTIOUS DISEASE:   # Pneumonia   # Worsening leukocytosis   Remains hypoxic, CXR with bibasilar opacities, Resp Cx on 6/16 wwith Light growth of GNR. Worsening leukocytosis despite antibiotics. On scheduled tylenol which may be masking fever. CT AP with no abscess or anastomotic leak.   - ID following   - Send repeat blood and sputum cultures  - Broaden abx to meropenum     HEMATOLOGY/ONCOLOGY:   # Acute on chronic anemia   - Daily CBC   - VTE ppx: SCDs & SQH     ENDOCRONOLOGY/RHEUMATOLOGY:   # T2 DM   - SSI     FAMILY UPDATE:   Patient's daughter was updated, they were appreciative and all questions were answered.     At this time patient does not require ICU level of care, recommend staying in level 2/IMC level of care.  Discussed with Dr. Rosana Hoes and Dr. Francisco Capuchin.  If any worsening changes in clinical status will be happy to reconsult and upgrade to ICU.    This patient is critically ill with life-threatening condition(s) and a high probability of sudden clinically significant deterioration due to the condition(s) noted in the assessment and plan, which requires the highest level of physician/advance practice provider preparedness to intervene urgently. Full attention to the direct care of this patient was provided for the period of time noted below. Any critical care time performed today is exclusive of teaching and billable procedures and not overlapping with any other physicians or advance practice providers.    I have personally assessed the patient and based my assessment and medical decision-making on a  review of the patient's history and 24-hour interval events along with medical records, physical examination, vital signs, analysis of recent laboratory results, evaluation of radiology images, monitoring data for potential decompensation, and additional findings found in detail within ICU team notes. The findings and plan of care was discussed with the care team.    Total critical care time: 50 minutes during this encounter.    Bettina Gavia, [place]   07/06/2022 4:20 PM

## 2022-07-06 NOTE — Respiratory Progress Note (Signed)
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 Yes               Airway: Natural   Mobility: Non-ambulatory, can reposition self       Cough Effort: Weak  Secretion Amount: Large    Sputum Consistency: Thick     Can clear secretions with cough? No  Can clear secretions with suctioning? Yes     Social History     Tobacco Use   Smoking Status Former   Smokeless Tobacco Never        Breath Sounds:  Bilateral Breath Sounds: Rhonchi  R Breath Sounds: Diminished  L Breath Sounds: Diminished    Heart Rate: 92 Resp Rate: (!) 30  SpO2: 99 % O2 Device: Nasal cannula  FiO2: 100 %  O2 Flow Rate (L/min): 2 L/min    Home regimen:              Criteria for therapy:  Secretion Clearance: Unable to clear sec with cough or suctioning     Medications: Hx of COPD, Asthma, Bronchitis or other documented RAD    Recommendations/Interventions:  Recommendations/Interventions: Suction     Expected Outcomes:               Re-Evaluation:  Follow-up Date:   Improving with Therapy: N/A    Plan of Care Recommendations:

## 2022-07-06 NOTE — Progress Notes (Addendum)
Infectious Diseases & Tropical Medicine  Progress Note    07/06/2022   Shelby Barron ZOX:[id number],XBJ:[id number] is a 82 y.o. female,       Assessment:     Encephalopathy  History of bladder cancer and renal failure  Status post radical cystectomy/bilateral nephrectomy, enterectomy and lysis of adhesion (06/28/2022)  S/p small bowel resection  Risk of aspiration  Sputum culture-few gram-negative rods (small quantity)  Leukocytosis-on steroids    Plan:     Continue Zosyn  Follow up chest x-ray  Continue probiotics  Monitor electrolytes and renal functions closely  Monitor clinically  Discussed with nursing staff         Discussed with patient's daughter in detail    ROS:     General:  no fever, NG tube in place, daughter at bedside, receiving hemodialysis  Respiratory: No cough or shortness of breath  Gastrointestinal: no diarrhea  Genito-Urinary: no hematuria  Musculoskeletal: no edema  Neurological: c/o generalized weakness   Dermatological: No rash    Physical Examination:     Blood pressure (!) 169/104, pulse 96, temperature 98.4 F (36.9 C), temperature source Axillary, resp. rate (!) 23, height 1.575 m (5\' 2" ), weight 93.6 kg (206 lb 5.6 oz), SpO2 98%.    General Appearance: Awake, noncommunicative  HEENT: Pupils are equal, round, and reactive to light.   Lungs: Decreased breath sounds  Heart:  Regular rate and rhythm  Chest: Symmetric chest wall expansion.   Abdomen: soft, no hepatosplenomegaly, no bowel sounds  Neurological: noncommunicative  Extremities: No edema    Laboratory And Diagnostic Studies:     Recent Labs     07/06/22  0341 07/05/22  0237   WBC 23.06* 22.46*   Hemoglobin 7.4* 7.9*   Hematocrit 22.6* 24.4*   Platelet Count 281 213     Recent Labs     07/06/22  0341 07/05/22  0237   Sodium 139 143   Potassium 4.5 4.4   Chloride 106 106   CO2 21 23   BUN 79* 50*   Creatinine 5.3* 4.0*   Glucose 210* 200*   Calcium 7.7* 7.6*     Recent Labs     07/06/22  0341   Albumin 1.3*       Current Meds:       Scheduled Meds: PRN Meds:    amLODIPine, 5 mg, Oral, Daily  heparin (porcine), 5,000 Units, Subcutaneous, Q12H SCH  insulin lispro, 2-10 Units, Subcutaneous, Q4H SCH  piperacillin-tazobactam, 2.25 g, Intravenous, Q8H  thiamine, 100 mg, Intravenous, Q24H SCH        Continuous Infusions:     acetaminophen, 650 mg, Q6H PRN  albumin human, 100 mL, PRN  albuterol-ipratropium, 3 mL, Q6H PRN  benzocaine, 1 spray, TID PRN  dextrose, 15 g of glucose, PRN   Or  dextrose, 12.5 g, PRN   Or  dextrose, 12.5 g, PRN   Or  glucagon (rDNA), 1 mg, PRN  diphenhydrAMINE, 25 mg, Q6H PRN  labetalol, 10 mg, Q2H PRN  lidocaine 2% jelly, , Once PRN  naloxone, 0.2 mg, PRN  phenol, 1 spray, Q2H PRN  sodium chloride, 100 mL, Q1H PRN  sodium chloride, 250 mL, PRN          Ermelinda Eckert A. Janalyn Rouse, M.D.  07/06/2022  9:05 AM

## 2022-07-06 NOTE — Progress Notes (Signed)
Pt had HR 167 - 183, tachy/aFib shows on cardiac monitor after 30 mins of HD tx.  Blood was returned per Nephrologist and pause tx and waited for to restart tx.  During HD tx HR was on and off elevated.  UF lowered for pt to tolerate tx. 1 hr before end of tx HR elavated/aFib HR of 155 shows again, returned the blood for the 2nd time. Notified Dr. Lendell Caprice about it.  ICU RN notified and MD ordered for metoprolol IV and tab and after of waiting to stablize HR still high and BP low of  89/65.  Notified Nephrologist and Dr. Halford Chessman replied NOT to restart tx. R temporary catheter access is optimally working post dialysis. Catheter dressing clean, dry and intact.  Hemodialysis ended and pt. able to finish 2.5hrs tx.  Able to removed Net Fluid of . Attending MD was talking to daughter at bedside. Reports were given to Primary nurse.       07/06/22 1400   Treatment Summary   Time Off Machine 1300   Duration of Treatment (Hours) 2.5   Treatment Type 1:1   Dialyzer Clearance Moderately streaked   Fluid Volume Off (mL) 1529   Prime Volume (mL) 200   Rinseback Volume (mL) 200   Fluid Given: Normal Saline (mL) 400   Fluid Given: PRBC  0 mL   Fluid Given: Albumin (mL) 0   Fluid Given: Other (mL) 0   Total Fluid Given 800   Hemodialysis Net Fluid Removed 729   Post Treatment Assessment   Post-Treatment Weight (Kg)   (- .)   Patient Response to Treatment Unable to tolerate HD tx, HR elevated, BP low   Additional Dialyzer Used 0   Temporary Catheter with Pigtail 06/30/22 Temporary Catheter Right   Placement Date/Time: 06/30/22 1700   Access Type: Temporary Catheter  Orientation: Right  Central Line Infection Prevention Education provided?: Yes  Hand Hygiene: Chlorhexidine;Alcohol based hand scrub  Line cart used?: Yes  Line kit used?: Yes  Maxi...   Line necessity reviewed? Apheresis/hemodialysis   Site Assessment Clean;Dry;Intact   Catheter Lumen Volume Venous 1.2 mL   Catheter Lumen Volume Arterial 1.2  mL   Tego/Curos Caps on Catheter Yes   NEW Tego/Curos Caps placed (Date) 07/06/22   End Caps Free From Blood Yes   Line Care Connections checked and tightened   Dressing Type Transparent;Biopatch   Dressing Status Clean;Dry;Intact   Dressing/Line Intervention Caps changed   Line Used For Blood Draw No   Dressing Change Due 07/12/22   Vitals   Temp 98.1 F (36.7 C)   Heart Rate (!) 127   Resp Rate (!) 9   BP (!) 89/52   SpO2 96 %   O2 Device None (Room air)   Assessment   Mental Status Does not follow commands   Cardiac (WDL) WDL   Cardiac Rhythm Atrial fibrillation   Respiratory Pattern Tachypneic   R Breath Sounds Diminished   L Breath Sounds Diminished   Generalized Edema Non Pitting Edema   General Skin Color Appropriate for ethnicity   Gastrointestinal (WDL) X   Abdomen Inspection Distended   Mobility Bed   Pain Assessment   Charting Type Assessment   Pain Scale Used CPOT   Education   Person taught Other (Comments)  (Daughter)   Knowledge basis Substantial   Topics taught Procedure   Teaching Tools Explain   Reponse Needs Reinforcement   Bedside Nurse Communication   Name of bedside RN -  post dialysis Constance Goltz, RN

## 2022-07-06 NOTE — Progress Notes (Signed)
[facility]  PROGRESS NOTE      Patient: Shelby Barron  Date: 07/06/2022   LOS: 8 Days  Admission Date: 06/28/2022   MRN: [id number]  Attending: Ester Rink, DO  When on service as the attending, please contact me on Epic Secure Chat from 7 AM - 7 PM for non-urgent issues. For urgent matters use XTend page from 7 AM - 7 PM.       ASSESSMENT/PLAN     Shelby Barron is a 82 y.o. female admitted with Bladder cancer    Interval Summary: [name], [name], type 2 diabetes, renal cell carcinoma status post partial bilateral nephrectomy, [name], new diagnosis of urothelial cancer initally presented on 6/10 for elective cystectomy and completion nephrectomy, [name], [name], extubated and off pressors since 6/13.     She is hypoactive and delirious. Surgery recommended NGT and CTAP today. Plan to start TF today. CTH pending.  Has [name] T/Th/Sat. Nephrology following     Developed new onset afib RVR today during HD. Stopped norvasc switched to metoprolol and gave additional IV metoprolol. Consulted cardiology. Discussed with ICU requested a consult given deterioration. Recommended repeat blood cultures and broadening antibiotics to meropenem. Palliative care consulted.    Patient Active Hospital Problem List:  #S/P Cystectomy and completion nephrectomy complicated by small bowel enterotomy   #Mixed shock - hemorrhagic vs septic: Resolved  - 6/10 small bowel enteretomy with 1L EBL. Went to the OR on June 12 for small bowel anastomosis, closure of mesenteric defect, closure of abdominal wall with wound VAC application  - Failed SLP eval yesterday. Will plan to initiate on tube feeds per nutrition recs  -Wound VAC changed 6/15  - Surgery consulted, recommend CTAP with PO contrast to eval for intra-abdominal source of infection given uptrending WBC. Surgery ordered pre-medication  with steroid/benadryl given h/o contrast allergy  - trend CBC, transfuse Hgb<7. Transfused 3 units PRBC in OR. Received another unit on 6/11 and 6/12  - TTE as part of preop cardiac clearance which showed Mild aortic stenosis (AVA 1.7), grade 1 diastolic dysfunction, normal LV/RV systolic function  - ID consulted, continue IV cefepime and flagyl     #History of CVA  #Encephalopathy/delirium  - CT head pending  - Hold home plavix  - Adhere to day/night cycles and administer constant reorientation.   - CAM ICU monitoring with early initiation of atypical antipsychotics as needed for control of agitated delirium.   - Out of bed to chair with PT/OT consult as indicated.      #Acute hypoxemic respiratory failure  #COPD  Extubated on 6/13, currently on 2L NC   Maintain SaO2 > 92%     #ESRD  HD Tues/Thurs/Sat  Will need eventual permcath exchange this week     #HTN  Started on norvasc  Off nicardipine gtt, PRN IV labetalol for SBP > 180     #Renal cell carcinoma  #Urothelial carcinoma  #Thrombocytopenia  - VTE ppx: SCDs, subcutaneous heparin     #Diabetes Mellitus type 2  -Sliding scale insulin          Nutrition: NPO  Malnutrition Documentation    Moderate Malnutrition related to inadequate nutritional intake in the setting of acute illness as evidenced by <50% EER > 5 days, mild muscle losses (temporalis, deltoid) - new  Patient has BMI=Body mass index is 37.74 kg/m.  Diagnosis: Obesity based on BMI criteria           Recent Labs   Lab 07/06/22  0341 07/05/22  0237 07/04/22  0432   Hemoglobin 7.4* 7.9* 8.0*   Hematocrit 22.6* 24.4* 24.2*   MCV 94.6 92.8 92.0   WBC 23.06* 22.46* 19.25*   Platelet Count 281 213 174     Recent Labs   Lab 06/02/22  0514   Iron 62   TIBC 145*   Iron Saturation 43       Code Status: Full Code    Dispo: pending improved mental status    Family Contact: none    DVT Prophylaxis:   Current Facility-Administered Medications (Includes Only Anticoagulants, Misc. Hematological)    Medication Dose Route Last Admin    heparin (porcine) injection 5,000 Units  5,000 Units Subcutaneous 5,000 Units at 07/06/22 0841          CHART  REVIEW & DISCUSSION     The following chart items were reviewed as of 6:54 PM on 07/06/22:  []  Lab Results []  Imaging Results   []  Problem List  []  Current Orders []  Current Medications  []  Allergies  []  Code Status []  Previous Notes   []  SDoH    The management and plan of care for this patient was discussed with the following specialty consultants:  []  Cardiology  []  Gastroenterology                 []  Infectious Disease  []  Pulmonology []  Neurology                [x]  Nephrology  []  Neurosurgery []  Orthopedic Surgery  []  Heme/Onc  []  General Surgery []  Psychiatry                                   []  Palliative    SUBJECTIVE     Shelby Barron moaning, not responding appropriately   Family updated bedside    MEDICATIONS     Current Facility-Administered Medications   Medication Dose Route Frequency    acetaminophen  650 mg Oral 4 times per day    heparin (porcine)  5,000 Units Subcutaneous Q12H [facility]    insulin lispro  2-10 Units Subcutaneous Q4H [facility]    meropenem  500 mg Intravenous Q24H    metoprolol tartrate  25 mg Oral Q12H [facility]    thiamine  100 mg Intravenous Q24H [facility]       PHYSICAL EXAM     Vitals:    07/06/22 1800   BP: 124/57   Pulse: 86   Resp: (!) 32   Temp:    SpO2: 98%       Temperature: Temp  Min: 97.8 F (36.6 C)  Max: 98.8 F (37.1 C)  Pulse: Pulse  Min: 80  Max: 183  Respiratory: Resp  Min: 9  Max: 47  Non-Invasive BP: BP  Min: 89/52  Max: 177/78  Pulse Oximetry SpO2  Min: 95 %  Max: 100 %    Intake and Output Summary (Last 24 hours) at Date Time    Intake/Output Summary (Last 24 hours) at 07/06/2022 1854  Last data filed at 07/06/2022 1800  Gross per 24 hour   Intake 1463.33 ml   Output 759 ml   Net 704.33 ml         GEN APPEARANCE:  Normal;  A&OX0  HEENT: PERLA; EOMI; Conjunctiva Clear  NECK: Supple; No bruits  CVS: RRR, S1, S2; No M/G/R  LUNGS: CTAB; No  Wheezes; No Rhonchi: No rales  ABD: Soft; No TTP; + Normoactive BS  EXT: No edema; Pulses 2+ and intact  NEURO: moving UEs spontaneously, moaning intermittently unable to cooperate fully in MS exam  MENTAL STATUS:     LABS     Recent Labs   Lab 07/06/22  0341 07/05/22  0237 07/04/22  0432   WBC 23.06* 22.46* 19.25*   RBC 2.39* 2.63* 2.63*   Hemoglobin 7.4* 7.9* 8.0*   Hematocrit 22.6* 24.4* 24.2*   MCV 94.6 92.8 92.0   Platelet Count 281 213 174       Recent Labs   Lab 07/06/22  1545 07/06/22  0341 07/05/22  0237 07/04/22  0432 07/03/22  1412 07/03/22  0347   Sodium 139 139 143 141 137 136   Potassium 3.8 4.5 4.4 3.9 3.5 4.3   Chloride 103 106 106 104 103 106   CO2 23 21 23 24 25 23    BUN 49* 79* 50* 29* 15 23*   Creatinine 3.4* 5.3* 4.0* 2.6* 1.4* 1.9*   Glucose 200* 210* 200* 206* 269* 238*   Calcium 7.8* 7.7* 7.6* 7.9 8.3 10.4*   Magnesium  --  2.3 2.2 2.1 1.9 2.4       Recent Labs   Lab 07/06/22  0341 07/03/22  0347 07/02/22  1749 07/02/22  1122 07/02/22  0502 07/01/22  1120 07/01/22  0506   ALT  --  16  --   --  18  --  18   AST (SGOT)  --  16  --   --  23  --  25   Bilirubin, Total  --  0.2  --   --  0.3  --  0.4   Bilirubin Direct  --  0.1  --   --  0.2  --  0.2   Albumin 1.3* 1.4*  1.4* 1.4*  More results in Results Review 1.5*  1.4*  More results in Results Review 1.4*  1.4*   Alkaline Phosphatase  --  61  --   --  64  --  68   More results in Results Review = values in this interval not displayed.             Recent Labs   Lab 07/01/22  1120 07/01/22  0506 06/30/22  2310   INR 1.1 1.0 1.1   PT 12.6 12.1 12.3   PTT 35 38 27       Microbiology Results (last 15 days)       Procedure Component Value Units Date/Time    Culture, Blood, Aerobic And Anaerobic [[id number]] Collected: 07/06/22 1837    Order Status: Sent Specimen: Blood, Venous     Culture, Blood, Aerobic And Anaerobic [[id number]]     Order Status: Sent Specimen: Blood, Venous     Culture, Sputum and Lower Respiratory [[id number]]     Order Status:  Sent Specimen: Sputum, Induced     Culture, Sputum and Lower Respiratory [[id number]]  (Abnormal) Collected: 07/04/22 1034    Order Status: Completed Specimen: Sputum, Suctioned Updated: 07/06/22 1646     Culture Respiratory Heavy growth of Mixed upper respiratory flora      Light growth of Gram negative rod     Comment: Questionable significance due to low quantity.  No further work.  Gram Stain Few WBCs      Rare Squamous epithelial cells      Few Mixed respiratory flora    Rectal Swab Carbapenemase Surveillance, PCR [[id number]]  (Normal) Collected: 06/29/22 1842    Order Status: Completed Specimen: Swab from Rectum Updated: 06/30/22 0150     IMP gene sequence Not Detected     VIM gene sequence Not Detected     NDM gene sequence Not Detected     KPC gene sequence Not Detected     OXA-48 gene sequence Not Detected    Narrative:      This test utilizes real-time PCR for the detection and differentiation of gene sequences associated with carbapenem-non-susceptibility in Enterobacterales (Enterobacteriaceae), Pseudomonas aeruginosa and Acinetobacter baumannii. Targets include blaKPC, blaNDM, blaVIM, blaOXA-48 (and blaOXA-181), and blaIMP (excluding IMP-7, IMP-13, IMP-14). Detection of these gene sequences does not indicate the presence of viable organisms. This assay does not report variants of these gene sequences. Mutations or polymorphisms may affect detection of current, new, or unknown variants, resulting in a false negative result. This test is for infection prevention surveillance purposes only and should not be used to make treatment decisions. The performance of the Xpert Carba-R Assay has not been evaluated with rectal swab specimens from pediatric patients.             RADIOLOGY     XR Chest AP Portable    Result Date: 07/06/2022   Hypoventilation with basilar atelectasis. Charlott Rakes, MD 07/06/2022 3:35 PM    CT Abdomen Pelvis W IV And PO Cont    Result Date: 07/06/2022  1.Postoperative changes from  bilateral nephrectomy and cystectomy. 2.Small amount of ascites including some areas which appear loculated. Small collections of fluid and gas in the anterior midline abdomen may be postoperative but superimposed infection is not excluded. No rim-enhancing fluid collections or extravasation of oral contrast to suggest anastomotic leak. Demetrios Isaacs, MD 07/06/2022 5:51 AM    CT Head WO Contrast    Result Date: 07/06/2022   1.No acute intracranial abnormality is seen. Leandro Reasoner, MD 07/06/2022 3:56 AM    XR Abdomen AP    Result Date: 07/05/2022  Enteric tube terminates in the distal stomach. Elease Etienne, DO 07/05/2022 12:24 PM    XR Chest AP Portable    Result Date: 06/30/2022  1. Lines and tubes in adequate position. No pneumothorax. 2. Trace left pleural effusion and bibasilar atelectasis. Jasmine December D'Heureux, MD 06/30/2022 4:53 PM    XR Chest AP Portable    Result Date: 06/30/2022  Catheters as described. Pankaj Dominica, MD 06/30/2022 2:07 PM    XR Chest AP Portable    Result Date: 06/28/2022   Right mainstem bronchial intubation. The tube should be withdrawn about 4 cm. Dr. Tildon Husky is aware. Wynema Birch, MD 06/28/2022 4:56 PM    XR OR MISCOUNT    Result Date: 06/28/2022   No unexpected foreign body. These critical results were discussed with and acknowledged by Angie R., the circulating nurse, who will communicate the findings to Everardo All, MD on 06/28/2022 3:02 PM. Bosie Helper, MD 06/28/2022 3:03 PM   Echo Results       None          No results found for this or any previous visit.    Signed,  Ester Rink, DO  6:54 PM 07/06/2022

## 2022-07-06 NOTE — Progress Notes (Signed)
Arrived in ICU, pt is little bit restless and don't follow commands. Pt has mittens. Daughter at bedside. R temporary catheter access is optimally working.  Catheter dressing clean, dry and intact.  Report received from primary RN pre-dialysis. Timeouts and safety checks done. Will closely monitor the pt.       07/06/22 0937   Bedside Nurse Communication   Name of bedside RN - pre dialysis Constance Goltz, RN   Treatment Initiation- With Dialysis Precautions   Time Out/Safety Check Completed Yes   Consent for HD signed for this hospitalization (Date) 06/01/22   Consent for HD signed for this hospitalization (Time) 1516   Blood Consent Verified N/A   Dialysis Precautions All Connections Secured;Saline Line Double Clamped;Venous Parameters Set;Arterial Parameters Set;Air Foam Detecctor Engaged   Dialysis Treatment Type ICU/CCU;Bedside   Is patient diabetic? Yes   RO/Hemodialysis Cabin crew   Is Total Chlorine less than 0.1 ppm? Yes   Orignial Total Chlorine Testing Time 0920   At 4 Hour Total Chlorine Testing Time 1320   RO/Hemodialysis Arts development officer Number 6    Machine Serial Number [id number]   RO # 21   RO Serial # [id number]   pH 7.2   Pressure Test Verified Yes   Alarms Verified Passed   Machine Temperature 98.6 F (37 C)   Alarms Verified Yes   Na+ mEq (Machine) 138 mEq   Bicarb mEq (Machine) 35 mEq   Hemodialysis Conductivity (Machine) 13.8   Hemodialysis Conductivity (Meter) 13.8   Dialyzer Lot Number [id number]   Tubing Lot Number [id number]   RO Machine Log Completed Yes   Hepatitis Status   HBsAg (Antigen) Result Negative   HBsAg Date Drawn 07/03/22   HBsAg Repeat Draw Due Date 07/31/22   Dialysis Weight   Pre-Treatment Weight (Kg) 93.6   Scale Type ICU Bed Scale   Vitals   Temp 97.8 F (36.6 C)   Heart Rate 100   Resp Rate (!) 26   BP (!) 143/98   SpO2 96 %   O2 Device None (Room air)   Assessment   Mental Status Other (Comment);Does not follow commands   Cardiac Rhythm Atrial  fibrillation   Respiratory Pattern Tachypneic   R Breath Sounds Diminished   L Breath Sounds Diminished   Abdomen Inspection Distended   Mobility Bed   Temporary Catheter with Pigtail 06/30/22 Temporary Catheter Right   Placement Date/Time: 06/30/22 1700   Access Type: Temporary Catheter  Orientation: Right  Central Line Infection Prevention Education provided?: Yes  Hand Hygiene: Chlorhexidine;Alcohol based hand scrub  Line cart used?: Yes  Line kit used?: Yes  Maxi...   Line necessity reviewed? Apheresis/hemodialysis   Site Assessment Clean;Dry;Intact   Catheter Lumen Volume Venous 1.2 mL   Catheter Lumen Volume Arterial 1.2 mL   Tego/Curos Caps on Catheter Yes   End Caps Free From Blood Yes   Line Care Connections checked and tightened   Dressing Type Transparent;Biopatch   Dressing Status Clean;Dry;Intact   Dressing Change Due 07/12/22   Pain Assessment   Charting Type Assessment   Pain Scale Used CPOT   Hemodialysis Comments   Pre-Hemodialysis Comments Timeout and safety checks done

## 2022-07-06 NOTE — Plan of Care (Signed)
Problem: Renal Instability  Goal: Fluid and electrolyte balance are achieved/maintained  Outcome: Progressing  Flowsheets (Taken 07/06/2022 1009)  Fluid and electrolyte balance are achieved/maintained:   Monitor/assess lab values and report abnormal values   Assess and reassess fluid and electrolyte status   Observe for cardiac arrhythmias   Monitor for muscle weakness   Follow fluid restrictions/IV/PO parameters     Problem: Patient Receiving Advanced Renal Therapies  Goal: Therapy access site remains intact  Outcome: Progressing  Flowsheets (Taken 07/06/2022 1009)  Therapy access site remains intact:   Assess therapy access site   Change therapy access site dressing as needed

## 2022-07-06 NOTE — H&P (Deleted)
[facility] ICU  Consult      Patient Name: Shelby Barron  MRN: [id number]  Room: A2910/A2910-01  Code Status: Full Code      Assessment & Plan   MSICU Attending Assessment/Plan:    Billing:             Chief Complaint / Primary Reason for MSICU Evaluation     AF RVR     History of Presenting Illness   Shelby Barron is a 82 y.o. female with Hx of COPD, prior CVA (2004), T2DM, renal cell carcinoma status post partial bilateral nephrectomy, [name], and recent diagnosis of urothelial cancer.  Patient initially admitted on 6/10 for scheduled cystectomy and complete nephrectomy.  OR course was complicated by small bowel enterotomy and significant intraoperative bleeding (1 L EBL), patient was admitted to ICU in discontinuity with open abdomen.  Patient's hemorrhagic shock was treated with blood transfusions, also treated with broad-spectrum antibiotics. Patient went to the OR on 6/12 for small bowel anastomosis and closure with wound VAC placement.  Patient weaned off pressors and extubated to nasal cannula pm 6/13. Initially patient required CRRT due to shock, decision to IHD on 6/15.  Course was further complicated by delirium and encephalopathy.  Patient downgraded out of ICU on 6/16.  Patient with worsening encephalopathy, respiratory culture positive for GNR's patient treated for pneumonia.    On 6/18 patient was undergoing HD session and developed A-fib RVR with rates to 150s and hypotensive to 89/52 with MAP of 64, dialysis session was terminated, a total of 730 cc of fluid was removed.  Patient was treated with 2.5 mg IV metoprolol and given 25 mg p.o. metoprolol.  ICU called for evaluation.  On evaluation patient is obtunded, eyes are open spontaneously and patient groans in response to noxious stimuli but is otherwise unresponsive, cough is present, POCUS with hyperdynamic cardiac function, difficult to visualize IVC due to abdominal tenderness postoperatively.  Lung sounds are coarse with  scattered rhonchi.  Patient is in A-fib with rate 120s to 130s, improved to SBP 130s after HD was terminated.    Subjective   Past Medical History:     Past Medical History:   Diagnosis Date    Acid reflux     Asthma, well controlled     Cancer of kidney     Chronic lower back pain     Congestive heart disease     COPD (chronic obstructive pulmonary disease)     CVA (cerebral infarction) 01/12/2003    Diabetes     Diabetic nephropathy     Fibroids     Gout     H/O: gout     Hyperlipidemia     Hypertension     Neuropathy of hand     SOB (shortness of breath)        Past Surgical History:     Past Surgical History:   Procedure Laterality Date    APPENDECTOMY (OPEN)      CLOSURE, ENTEROTOMY, SMALL INTESTINE  06/28/2022    Procedure: CLOSURE, ENTEROTOMY, SMALL INTESTINE;  Surgeon: Marlaine Hind, MD;  [facility];  Service: General;;    CYSTECTOMY, ILEOCONDUIT N/A 06/28/2022    Procedure: CYSTECTOMY, RADICAL;  Surgeon: Neldon Newport, MD;  [facility];  Service: Urology;  Laterality: N/A;    DRAIN (OTHER) N/A 05/31/2022    Procedure: DRAIN (OTHER);  Surgeon: Hope Pigeon, MD;  [facility];  Service: Interventional Radiology;  Laterality: N/A;    EXPLORATORY LAPAROTOMY N/A 06/30/2022    Procedure: SMALL BOWEL ANASTOMOSIS, CLOSURE OF MESENTERIC DEFECT, CLOSURE OF ABDOMINAL WALL;  Surgeon: Marlaine Hind, MD;  [facility];  Service: General;  Laterality: N/A;  **IP-2910.01/ MD AVAIL [id number] OR AFTER 1600**    EXPLORATORY LAPAROTOMY, RESECTION SMALL BOWEL N/A 06/28/2022    Procedure: RESECTION SMALL BOWEL;  Surgeon: Marlaine Hind, MD;  [facility];  Service: General;  Laterality: N/A;    HERNIA REPAIR      HYSTERECTOMY      LAPAROTOMY, NEPHRECTOMY Bilateral 06/28/2022    Procedure: BILATERAL OPEN RADICAL NEPHRECTOMY AND LYSIS OF ASHESION;  Surgeon: Neldon Newport, MD;  [facility];  Service: Urology;  Laterality: Bilateral;    PARTIAL NEPHRECTOMY       tumor removed from kidney  cJanuary 2015    TUNNELED CATH PLACEMENT (PERMCATH) N/A 06/01/2022    Procedure: TUNNELED CATH PLACEMENT;  Surgeon: Suszanne Finch, MD;  [facility];  Service: Interventional Radiology;  Laterality: N/A;    TURBT      WOUND VAC APPLICATION N/A 06/28/2022    Procedure: Tawni Pummel WOUND VAC APPLICATION;  Surgeon: Marlaine Hind, MD;  [facility];  Service: General;  Laterality: N/A;    WOUND VAC APPLICATION N/A 06/30/2022    Procedure: WOUND VAC APPLICATION;  Surgeon: Marlaine Hind, MD;  [facility];  Service: General;  Laterality: N/A;       Family History:     Family History   Problem Relation Age of Onset    Cancer Mother     Heart failure Father     Cancer Maternal Aunt     Diabetes Maternal Aunt     Cancer Paternal Aunt        Social History:     Social History     Socioeconomic History    Marital status: Married     Spouse name: Not on file    Number of children: Not on file    Years of education: Not on file    Highest education level: Not on file   Occupational History    Not on file   Tobacco Use    Smoking status: Former    Smokeless tobacco: Never   Vaping Use    Vaping status: Never Used   Substance and Sexual Activity    Alcohol use: No    Drug use: No    Sexual activity: Not on file   Other Topics Concern    Not on file   Social History Narrative    Not on file     Social Determinants of Health     Financial Resource Strain: Not on file   Food Insecurity: No Food Insecurity (05/26/2022)    Hunger Vital Sign     Worried About Running Out of Food in the Last Year: Never true     Ran Out of Food in the Last Year: Never true   Transportation Needs: No Transportation Needs (05/26/2022)    PRAPARE - Therapist, art (Medical): No     Lack of Transportation (Non-Medical): No   Physical Activity: Not on file   Stress: Not on file   Social Connections: Unknown (02/01/2022)    Received from [facility]    Social Network     Social Network:  Not on file   Intimate Partner Violence: Not At Risk (05/26/2022)    Humiliation,  Afraid, Rape, and Kick questionnaire     Fear of Current or Ex-Partner: No     Emotionally Abused: No     Physically Abused: No     Sexually Abused: No   Housing Stability: Unknown (05/26/2022)    Housing Stability Vital Sign     Unable to Pay for Housing in the Last Year: No     Number of Places Lived in the Last Year: Not on file     Unstable Housing in the Last Year: No       Allergies:     Allergies   Allergen Reactions    Metrizamide Shortness Of Breath and Nausea Only    Rosuvastatin Other (See Comments)     myopathy    myopathy   myopathy    myopathy myopathy      myopathy    myopathy, myopathy    Iodinated Contrast Media Other (See Comments)     Nausea and SOB per patient   On Dialysisi    Nausea and SOB per patient    Nausea and SOB per patient      Nausea and SOB per patient   Nausea and SOB per patient    Nausea and SOB per patient      Nausea and SOB per patient Nausea and SOB per patient      Nausea and SOB per patient Nausea and SOB per patient Nausea and SOB per patient      Nausea and SOB per patient  Nausea and SOB per patient Nausea and SOB per patient    Nausea and SOB per patient   Nausea and SOB per patient    Pioglitazone      Weight gain    Sulfa Antibiotics Hives     blister          Objective   Physical Examination   Vitals Temp:  [97.8 F (36.6 C)-98.8 F (37.1 C)]   Heart Rate:  [80-183]   Resp Rate:  [9-47]   BP: (89-177)/(52-129)   SpO2:  [95 %-100 %]   Weight:  [93.6 kg (206 lb 5.6 oz)]   BMI (calculated):  [37.7]  // Temperature with 24 range  Vent Settings      General: Ill-appearing female    Neuro:    Obtunded, responsive to noxious stimuli, protecting airway    Lungs:   Coarse lung sounds bilaterally with scattered rhonchi left greater than right    Cardiac:   Tachycardic, irregular    Abdomen:    mild tenderness to palpation, no guarding    Extremities:   Warm, no edema    Skin:     Surgical incision  CDI,     Assessment     Patient has BMI=Body mass index is 37.74 kg/m.  Diagnosis: Obesity based on BMI criteria         Recent Labs   Lab 07/06/22  0341 07/05/22  0237 07/04/22  0432   Hemoglobin 7.4* 7.9* 8.0*   Hematocrit 22.6* 24.4* 24.2*   MCV 94.6 92.8 92.0   WBC 23.06* 22.46* 19.25*   Platelet Count 281 213 174     Recent Labs   Lab 06/02/22  0514   Iron 62   TIBC 145*   Iron Saturation 43     Anemia Diagnosis: Acute: Acute Blood Loss Anemia on Chronic: Anemia of Chronic Kidney Disease and Anemia of Neoplastic Disease   Malnutrition Documentation    Moderate Malnutrition related to inadequate nutritional intake  in the setting of acute illness as evidenced by <50% EER > 5 days, mild muscle losses (temporalis, deltoid) - new               Neuro   Acute metabolic encephalopathy  Delirium  History of CVA    Cardiovascular   A-fib RVR  Hypotension  History of hypertension    Pulmonary  Pneumonia  Acute hypoxic respiratory failure  History of COPD    Renal  S/p cystectomy and complete nephrectomy  [name]    GI  Enterotomy s/p resection    Infections Disease  Pneumonia    Hematology/Onc  Renal cell carcinoma  Urothelial carcinoma  Thrombocytopenia    Endo/Rheum  Type 2 diabetes    ICU Checklist  Sedation:   CAM-ICU: Positive or Negative for Delirium: Positive   CAM ICU:   Last Documented RASS: RASS Score: Drowsy   Currently ordered infusions:    Reviewed: Yes   Mobility:   Current Mobility Level:   PMP Activity: Step 3 - Bed Mobility (07/06/2022 12:00 PM)    Current PT Order: No  Current OT Order: No Reviewed: Yes   Respiratory (n/a if blank):   Ventilator Time:    Last Recorded Vent Mode:    Reviewed: Yes   Gastrointenstinal  Last Bowel Movement:   Last BM Date: 07/05/22 Reviewed: Yes   CAUTI Prevention (n/a if blank):  Foley Day:        Reviewed: Yes   Blood Steam Infection Prevention (n/a if blank):   Permacath Catheter - Tunneled 06/01/22 Internal Jugular Right  Temporary Catheter with Pigtail 06/30/22  Temporary Catheter Right Reviewed: Yes   DVT Chemoprophylaxis (none if blank):   heparin (porcine) injection 5,000 Units  Reviewed: Yes            Plan     NEUROLOGICAL:   # Acute metabolic encephalopathy  # Delirium  # History of CVA  CTH 6/18 negative for acute findings.  Worsening encephalopathy in setting of infection and ICU delirium  - Delirium precautions, CAM ICU monitoring   - Home plavix on hold   - Tylenol and PRN oxycodone for pain control, minimize opioids as able give encephalopathy  - Avoid sedating agents  - Palliative care consulted for prolonged hospitalization with multiple co-morbilities (Dr. Phineas Semen)     CARDIOVASCULAR:   # Paroxysmal A-fib RVR  # Hypotension, resolved   # History of hypertension  Brief episode of A-fib RVR during dialysis session, converted back to normal sinus rhythm after 5 mg IV metoprolol given.  Brief episode of hypotension while undergoing HD with AF RVR, SBP now improved. Concern for worsening infection as underlying etiology.   - Continuous telemonitoring  - MAP goal > 65   - PRN anti-HTN, hold PO meds given labile BP   - Does not need for scheduled PO metoprolol given no history of AF, seems to be a provoked paroxysmal episode which is now resolved, patient in NSR rate 70-80s     PULMONARY:   # Acute hypoxic respiratory failulre   # PNA   CXR with bibasilar opacities  - Abx as below   - 2L NC, wean as able   - Aspiration precautions     RENAL:   # ESRD on iHD   # Renal cell carcinoma  # Urothelial carcinoma  - Nephrology following   - Trend renal function   - HD session 6/18 terminated early     GASTROINTESTINAL:  -  NGT for nutrition, failed SLP   - May benefit from bowel regimen, not currently ordered    INFECTIOUS DISEASE:   # Pneumonia   # Worsening leukocytosis   Remains hypoxic, CXR with bibasilar opacities, Resp Cx on 6/16 wwith Light growth of GNR. Worsening leukocytosis despite antibiotics. On scheduled tylenol which may be masking fever. CT AP with no abscess  or anastomotic leak.   - ID following   - Send repeat blood and sputum cultures  - Broaden abx to meropenum     HEMATOLOGY/ONCOLOGY:   # Acute on chronic anemia   - Daily CBC   - VTE ppx: SCDs & SQH     ENDOCRONOLOGY/RHEUMATOLOGY:   # T2 DM   - SSI     FAMILY UPDATE:   Patient's daughter was updated, they were appreciative and all questions were answered.     At this time patient does not require ICU level of care, recommend staying in level 2/IMC level of care.  Discussed with Dr. Rosana Hoes and Dr. Francisco Capuchin.  If any worsening changes in clinical status will be happy to reconsult and upgrade to ICU.    This patient is critically ill with life-threatening condition(s) and a high probability of sudden clinically significant deterioration due to the condition(s) noted in the assessment and plan, which requires the highest level of physician/advance practice provider preparedness to intervene urgently. Full attention to the direct care of this patient was provided for the period of time noted below. Any critical care time performed today is exclusive of teaching and billable procedures and not overlapping with any other physicians or advance practice providers.    I have personally assessed the patient and based my assessment and medical decision-making on a review of the patient's history and 24-hour interval events along with medical records, physical examination, vital signs, analysis of recent laboratory results, evaluation of radiology images, monitoring data for potential decompensation, and additional findings found in detail within ICU team notes. The findings and plan of care was discussed with the care team.    Total critical care time: 50 minutes during this encounter.    Bettina Gavia, [place]   07/06/2022 4:20 PM

## 2022-07-06 NOTE — Consults (Signed)
Wound Ostomy Continence Consultation    Date Time: 07/06/22 11:29 AM  Patient Name: Shelby Barron, Shelby Barron  Consulting Service: [facility] Day: 9   REASON FOR CONSULT:      Assessment     Assessment:       Date of Operation:   06/28/2022     Providers Performing:   Surgeons and Role:  Panel 1:     * Neldon Newport, MD - Primary     * Adele Dan, MD - Resident - Assisting     * Marlaine Hind, MD - Assistant Surgeon     * Nonda Lou, MD  Panel 2:     Marlaine Hind, MD - Primary     Operative Procedure:   Panel 1  CYSTECTOMY, RADICAL: 16109 (CPT)  BILATERAL OPEN RADICAL NEPHRECTOMY AND LYSIS OF ASHESION:   Panel 2  RESECTION SMALL BOWEL:   ABTHERA WOUND VAC APPLICATION:   SEROSAL INJURY REPAIR    Wound Assessment/Interventions:     Wound Type surgical :     Location- mid abdomen  Measurement 35_cm x _6_cm x 6__cm  Characteristics  WoundBed: full  Thickness tissue loss.   Granulation- 10 %  Non granulation tissue- 70 %  Slough- 20 %  Eschar- % -   Periwound/Edges:  macerate and denuded on lower area   Drainage: Amount small  Drainage color-  serosanguinous noted on the cannister  Drainage odor- none  Undermining: no  Tunneling: none  Pain: Yes        VAC Change:  Vac Serial Number:   Type of Foam used: black simplace and white foam   Number of pieces used: 3 black and 2 white   Contact layer applied: silver non adherent  Continuous negative pressure setting: 125 mm Hg, low, intensity    Plan     Plan/Recommendations:    Wound care to mid abdomen    Area cleansed with wound cleanser/Vashe, patted dry.    Applied Cavilon No-Sting skin barrier film to peri-wound and allowed to dry.   Placed Vac drape around peri-wound window paned style, to prevent maceration and top with Cera ring for more adherent.   Packed two pieces of silver non adherent dressing and top with  two pieces of white foam and 2 pieces of of black foam into a wound  Secured with vac drape.   A quarter size cut hole was cut into the black  foam to fit the trackpad.   Good seal obtained and wound vacuum setting at 125 mm Hg, low intensity continuous.  An aseptic technique utilized seemed to tolerate it well without any pain medication.        1.  Wound care orders entered into EMR.   2.  Care rendered.  3.  RN checks wound VAC Q 2 hr: Sponge contracted;  pump "ON" and plugged in.    4.  If unable to maintain the seal, apply Tegaderm dressing or vac drape to seal. May also contact [facility] at 1 - [phone] to troubleshoot.  5.  If still unable to maintain the seal and vac off or not working for 2 hours, remove all foam and place hydrogel moistened gauze into wound bed, cover with ABD pad secured with tape, and notify Physician and WOC team.          6.  Change two- three times a week.      When patient discharged or VAC discontinued -   Remove all  foam dressing and non adherent dressing and place hydrogel moistened gauze into the wound bed, cover with ABD pad tape to secure.   Wound Vac machine - discard canister and tubing.    Place wound vac in CLEAR plastic bag and place in the soiled utility room. DO NOT USE RED BIOHAZARD BAGS.    Please contact WOC office [phone] and leave a message for WOC to pick up.         Plan:     Discharge VAC Wound Information    Prescribed [facility] VAC Therapy for duration:  4 months     Discharge VAC Wound Information  Surgical   mid abdomen  Age of wound:   Did VAC initiate at the hospital?     yes         Initiated - 06/28/22  Compromised Nutritional status? no Nutrition consulted for wound care needs.  Other therapies previously tried: no.     Is there eschar present in the wound: no  Has wound been debrided in the last ten days:   fresh surgery     Are serial debridements required: no  Location: mid abdomen  Measurements: Date- 07/05/22 see above                            Length-   cm                            Width-  cm                            Depth- cm                            Undermining- no  cm                             Tunneling-  no cm  The appearance of wound bed and color: red, pink   Exudate: serosanguinous  Is wound full-thickness: yes  Is bone, tendon or muscle exposed: no  Is there undermining no  Is there tunneling no       Has the WOCN team attempted other dressings: no  WOCN assessed clinical reasons for the continuation of VAC at home:  Promotes wound healing, Speed granulation tissue, prevent infection          Initiate/ continue pressure prevention bundle:    Head of the bed 30 degrees or less  Positioning device to the bedside  Eliminate/minimize pressure from the area  Float heels with boots or pillows  Turn patient  Pressure redistribution cushion to the chair  Use lift sheets/low friction surface sheets for positioning.  Pad bony prominences  Nutrition consult/Optimize nutrition  Initiate bed algorithm/Specialty bed  Moisture/Incontinence management - Cleanse with incontinence cleansing wipe/water to manage incontinence and protect skin from exposure to urine and stool. Apply skin barrier protection cream. Apply [place]/external or female external urine management system to prevent urinary contamination of a wound. Apply rectal pouch/fecal management system per unit policy, to prevent fecal contamination of a wound or contain diarrhea.                       Wound care orders entered into EMR. Care rendered. WOCN will follow.  Wound Photography:         Objective Findings   Populated from Epic Documentation:  Specialty Bed:  Progressa mattress - Armed forces operational officer, incontinence management systems, and StayInPlace technology to address the five factors of skin breakdown - pressure, shear, friction, heat, and moisture - for optimal wound healing and skin protection.    Braden: Braden Scale Score: 13 (07/06/22 0800)  Braden Subscales:  Sensory Perceptions: Very limited (07/06/22 0800)  Moisture: Rarely moist (07/06/22 0800)  Activity: Bedfast (07/06/22 0800)  Mobility: Very limited  (07/06/22 0800)  Nutrition: Probably inadequate (07/06/22 0800)  Friction and Shear: Potential problem (07/06/22 0800)    Ht Readings from Last 1 Encounters:   07/02/22 1.575 m (5\' 2" )     Wt Readings from Last 3 Encounters:   07/06/22 93.6 kg (206 lb 5.6 oz)   06/25/22 87.1 kg (192 lb)   06/23/22 84.8 kg (187 lb)     Body mass index is 37.74 kg/m.    Current Diet:   Diet NPO effective now  Supervise For Meals Frequency: All meals  Tube feeding-Continuous     History of Present Illness   This is a 82 y.o. female  has a past medical history of Acid reflux, Asthma, well controlled, Cancer of kidney, Chronic lower back pain, Congestive heart disease, COPD (chronic obstructive pulmonary disease), CVA (cerebral infarction) (01/12/2003), Diabetes, Diabetic nephropathy, Fibroids, Gout, H/O: gout, Hyperlipidemia, Hypertension, Neuropathy of hand, and SOB (shortness of breath)..  Admitted with Bladder cancer.      Past Surgical History:   Procedure Laterality Date    APPENDECTOMY (OPEN)      CLOSURE, ENTEROTOMY, SMALL INTESTINE  06/28/2022    Procedure: CLOSURE, ENTEROTOMY, SMALL INTESTINE;  Surgeon: Marlaine Hind, MD;  [facility];  Service: General;;    CYSTECTOMY, ILEOCONDUIT N/A 06/28/2022    Procedure: CYSTECTOMY, RADICAL;  Surgeon: Neldon Newport, MD;  [facility];  Service: Urology;  Laterality: N/A;    DRAIN (OTHER) N/A 05/31/2022    Procedure: DRAIN (OTHER);  Surgeon: Hope Pigeon, MD;  [facility];  Service: Interventional Radiology;  Laterality: N/A;    EXPLORATORY LAPAROTOMY N/A 06/30/2022    Procedure: SMALL BOWEL ANASTOMOSIS, CLOSURE OF MESENTERIC DEFECT, CLOSURE OF ABDOMINAL WALL;  Surgeon: Marlaine Hind, MD;  [facility];  Service: General;  Laterality: N/A;  **IP-2910.01/ MD AVAIL [id number] OR AFTER 1600**    EXPLORATORY LAPAROTOMY, RESECTION SMALL BOWEL N/A 06/28/2022    Procedure: RESECTION SMALL BOWEL;  Surgeon: Marlaine Hind, MD;  [facility];   Service: General;  Laterality: N/A;    HERNIA REPAIR      HYSTERECTOMY      LAPAROTOMY, NEPHRECTOMY Bilateral 06/28/2022    Procedure: BILATERAL OPEN RADICAL NEPHRECTOMY AND LYSIS OF ASHESION;  Surgeon: Neldon Newport, MD;  [facility];  Service: Urology;  Laterality: Bilateral;    PARTIAL NEPHRECTOMY      tumor removed from kidney  cJanuary 2015    TUNNELED CATH PLACEMENT (PERMCATH) N/A 06/01/2022    Procedure: TUNNELED CATH PLACEMENT;  Surgeon: Suszanne Finch, MD;  [facility];  Service: Interventional Radiology;  Laterality: N/A;    TURBT      WOUND VAC APPLICATION N/A 06/28/2022    Procedure: Tawni Pummel WOUND VAC APPLICATION;  Surgeon: Marlaine Hind, MD;  [facility];  Service: General;  Laterality: N/A;    WOUND VAC APPLICATION N/A 06/30/2022    Procedure: WOUND VAC  APPLICATION;  Surgeon: Marlaine Hind, MD;  [facility];  Service: General;  Laterality: N/A;       Procedure(s) with comments:  SMALL BOWEL ANASTOMOSIS, CLOSURE OF MESENTERIC DEFECT, CLOSURE OF ABDOMINAL WALL - **IP-2910.01/ MD AVAIL [id number] OR AFTER 1600**  WOUND VAC APPLICATION    8 Days Post-Op  -------------------  **Canceled**    Procedure(s) with comments:  SMALL BOWEL ANASTOMOSIS, CLOSURE OF MESENTERIC DEFECT, CLOSURE OF ABDOMINAL WALL - **IP-2910.01/ MD AVAIL [id number] OR AFTER 1600**  WOUND VAC APPLICATION    * No surgery date entered *  -------------------    Procedure(s) with comments:  SMALL BOWEL ANASTOMOSIS, CLOSURE OF MESENTERIC DEFECT, CLOSURE OF ABDOMINAL WALL - **IP-2910.01/ MD AVAIL [id number] OR AFTER 1600**  WOUND VAC APPLICATION    6 Days Post-Op  -------------------      Lab   Significant Lab Values:  Recent Labs     07/06/22  0341   WBC 23.06*   RBC 2.39*   Hemoglobin 7.4*   Hematocrit 22.6*   Sodium 139   Potassium 4.5   Chloride 106   CO2 21   BUN 79*   Creatinine 5.3*   Calcium 7.7*   Albumin 1.3*   GFR 7.5*   Glucose 210*           Kanchan Gal "Sunshine" [name] BSN, RN, WOCN  Wound,  Ostomy, and Continence Nurse Coordinator  [facility]  [address], [place], [place] [address]  T [phone] S [phone]  [email or web address]

## 2022-07-06 NOTE — Plan of Care (Addendum)
CNS: GCS of 10/15. Mitts for both arms kept to prevent removal of life sustaining equipments. patient confused and restless.   Tylenol given.   CVS: NSR with HR of  70-80bpm. SBP >150s. Afebrile.   Resp: Room air, copious secretions. Oral suctioning done frequently.   GI: patent ND, Tube feeding to be started.   GU: anuric.   Lines: with patent Trialysis, Permacath, mdiline and peripheral IV,   Skin: Open abdomen to WoundVac, dressing changed by WOCN last night.   0130H seen by Hospitalist, patient restless, Benadryl IV ordered. Staggered Prednisone given.   0200H oral contrast given, tolerated.   0300H CT head and  abdomen done. Awaiting for results.   0500H Tramadol given.   0600H ND feeding not started as per Dr. Lynwood Dawley. CT results available, to be reviewed further this morning.   0800H CVL dressings changed by Loel Dubonnet.     Problem: Non-Violent Restraints Interdisciplinary Plan  Goal: Will be injury free during the use of non-violent restraints  Outcome: Progressing  Flowsheets (Taken 07/06/2022 0656)  Will be injury free during the use of non-violent restraints:   Attempt all alternatives before use of restraints   Initiate least restrictive type of restraint that is effective   Provide and maintain safe environment   Notify family of initiation of restraints   Include patient/family/caregiver in decisions related to safety   Ensure safety devices are properly applied and maintained   Document observed patient actions according to protocol   Nurse to accompany patient off unit when on restraints   Remove restraints before the indicated maximum length of time when meets criteria for discontinuation   Document significant changes in patient condition   Provide debriefing as soon as possible and appropriate   Reassess need for continued restraints   Ensure that order for restraints has not expired      Problem: Inadequate Gas Exchange  Goal: Patent Airway maintained  Outcome: Progressing  Flowsheets (Taken  07/06/2022 0810)  Patent airway maintained:   Position patient for maximum ventilatory efficiency   Provide adequate fluid intake to liquefy secretions   Suction secretions as needed   Reposition patient every 2 hours and as needed unless able to self-reposition     Problem: Diabetes: Glucose Imbalance  Goal: Blood glucose stable at established goal  Outcome: Progressing  Flowsheets (Taken 07/06/2022 0810)  Blood glucose stable at established goal:   Monitor lab values   Monitor intake and output.  Notify LIP if urine output is < 30 mL/hour.   Follow fluid restrictions/IV/PO parameters   Include patient/family in decisions related to nutrition/dietary selections   Assess for hypoglycemia /hyperglycemia   Monitor/assess vital signs   Coordinate medication administration with meals, as indicated   Ensure appropriate diet and assess tolerance   Ensure adequate hydration     Problem: Patient Receiving Advanced Renal Therapies  Goal: Therapy access site remains intact  Outcome: Progressing     Problem: Renal Instability  Goal: Fluid and electrolyte balance are achieved/maintained  Outcome: Progressing     Problem: Hemodynamic Status: Cardiac  Goal: Stable vital signs and fluid balance  Outcome: Progressing  Flowsheets (Taken 07/06/2022 0810)  Stable vital signs and fluid balance:   Assess signs and symptoms associated with cardiac rhythm changes   Monitor lab values     Problem: Pain  Goal: Pain at adequate level as identified by patient  Outcome: Progressing  Flowsheets (Taken 07/06/2022 0810)  Pain at adequate level as identified by  patient:   Identify patient comfort function goal   Assess for risk of opioid induced respiratory depression, including snoring/sleep apnea. Alert healthcare team of risk factors identified.   Assess pain on admission, during daily assessment and/or before any "as needed" intervention(s)   Reassess pain within 30-60 minutes of any procedure/intervention, per Pain Assessment, Intervention,  Reassessment (AIR) Cycle   Evaluate if patient comfort function goal is met   Evaluate patient's satisfaction with pain management progress   Offer non-pharmacological pain management interventions     Problem: Safety  Goal: Patient will be free from injury during hospitalization  Outcome: Progressing  Flowsheets (Taken 07/06/2022 0802)  Patient will be free from injury during hospitalization:   Assess patient's risk for falls and implement fall prevention plan of care per policy   Provide and maintain safe environment   Use appropriate transfer methods   Ensure appropriate safety devices are available at the bedside  Goal: Patient will be free from infection during hospitalization  Outcome: Progressing  Flowsheets (Taken 07/06/2022 0810)  Free from Infection during hospitalization:   Monitor all insertion sites (i.e. indwelling lines, tubes, urinary catheters, and drains)   Monitor lab/diagnostic results   Assess and monitor for signs and symptoms of infection   Encourage patient and family to use good hand hygiene technique     Problem: Impaired Mobility  Goal: Mobility/Activity is maintained at optimal level for patient  Outcome: Progressing  Flowsheets (Taken 07/06/2022 0802)  Mobility/activity is maintained at optimal level for patient:   Encourage independent activity per ability   Consult/collaborate with Physical Therapy and/or Occupational Therapy     Problem: Infection Prevention  Goal: Free from infection  Outcome: Progressing  Flowsheets (Taken 07/06/2022 0802)  Free from infection:   Monitor/assess vital signs   Encourage/assist patient to turn, cough and perform deep breathing every 2 hours   Assess incision for evidence of healing   Monitor/assess output from surgical drain if present   Assess for signs and symptoms of infection   Monitor/assess lab values and report abnormal values     Problem: Inadequate Airway Clearance  Goal: Patent Airway maintained  Outcome: Progressing  Flowsheets (Taken 07/06/2022  0810)  Patent airway maintained:   Position patient for maximum ventilatory efficiency   Provide adequate fluid intake to liquefy secretions   Suction secretions as needed   Reposition patient every 2 hours and as needed unless able to self-reposition     Problem: Non-Violent Restraints Interdisciplinary Plan  Goal: Will be injury free during the use of non-violent restraints  Outcome: Progressing  Flowsheets (Taken 07/06/2022 0656)  Will be injury free during the use of non-violent restraints:   Attempt all alternatives before use of restraints   Initiate least restrictive type of restraint that is effective   Provide and maintain safe environment   Notify family of initiation of restraints   Include patient/family/caregiver in decisions related to safety   Ensure safety devices are properly applied and maintained   Document observed patient actions according to protocol   Nurse to accompany patient off unit when on restraints   Remove restraints before the indicated maximum length of time when meets criteria for discontinuation   Document significant changes in patient condition   Provide debriefing as soon as possible and appropriate   Reassess need for continued restraints   Ensure that order for restraints has not expired     Problem: Moderate/High Fall Risk Score >5  Goal: Patient will remain free of falls  07/06/2022 0810 by Claiborne Rigg, RN  Outcome: Progressing  Flowsheets (Taken 07/05/2022 2200)  High (Greater than 13):   HIGH-Visual cue at entrance to patient's room   HIGH-Bed alarm on at all times while patient in bed   HIGH-Apply yellow "Fall Risk" arm band   HIGH-Utilize chair pad alarm for patient while in the chair   HIGH-Initiate use of floor mats as appropriate   HIGH-Consider use of low bed  07/06/2022 0723 by Claiborne Rigg, RN  Outcome: Progressing  07/06/2022 0656 by Claiborne Rigg, RN  Outcome: Progressing

## 2022-07-06 NOTE — Progress Notes (Signed)
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 and  cystectomy on 06/28/22  - complicated by small bowel enterotomy and blood loss   New onset afib - cardiology consulted   High grade urothelial bladder CA: s/p bilateral nephrectomies and cystectomy c/b mall bowel enterotomy and blood loss (~1L) on 06/28/22  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 (approx 1L) - stable  Hypophosphatemia  - better  Encephalopathy - persists    Recommendations:   S/p 2.5hr HD today with728cc UF - treatment ended early due to afir with RVR and hypotension  Eventual new permacath  Will plan for dialysis on Thursday   Cardiology consulted       Case discussed with: daughter, HR RN      Delice Bison, MD, MD  [facility]  [phone] (office)  X [phone] (FF[facility] Spectra-Link)      Subjective:  NGT placed  Seen at the end of dialysis, HR 120-130s  SBP 110s  Remains encephalopathic   Review of Systems:   Unable to assess, patient is confused     Physical Exam:     Vitals:    07/06/22 1317 07/06/22 1350 07/06/22 1400 07/06/22 1409   BP: 120/60 (!) 89/65 (!) 89/52 105/69   Pulse: (!) 143 (!) 129 (!) 127 (!) 127   Resp:  18 (!) 9 (!) 31   Temp:   98.1 F (36.7 C)    TempSrc:       SpO2:  96% 96% 96%   Weight:       Height:           Intake and Output Summary (Last 24 hours) at Date Time    Intake/Output Summary (Last 24 hours) at 07/06/2022 1509  Last data filed at 07/06/2022 1400  Gross per 24 hour   Intake 1450 ml   Output 759 ml    Net 691 ml       General: confused  Cardiovascular: regular rate and rhythm  Lungs: bilateral air entry  Abdomen: soft  Extremities: edema noted in her hands    Access: R quinton     Meds:      Scheduled Meds: PRN Meds:    acetaminophen, 650 mg, Oral, 4 times per day  heparin (porcine), 5,000 Units, Subcutaneous, Q12H SCH  insulin lispro, 2-10 Units, Subcutaneous, Q4H SCH  metoprolol tartrate, 25 mg, Oral, Q12H SCH  piperacillin-tazobactam, 2.25 g, Intravenous, Q8H  thiamine, 100 mg, Intravenous, Q24H SCH          Continuous Infusions:     albuterol-ipratropium, 3 mL, Q6H PRN  benzocaine, 1 spray, TID PRN  dextrose, 15 g of glucose, PRN   Or  dextrose, 12.5 g, PRN   Or  dextrose, 12.5 g, PRN   Or  glucagon (rDNA), 1 mg, PRN  diphenhydrAMINE, 25 mg, Q6H PRN  labetalol, 10 mg, Q2H PRN  lidocaine 2% jelly, , Once PRN  naloxone,  0.2 mg, PRN  oxyCODONE, 5 mg, Q6H PRN  phenol, 1 spray, Q2H PRN              Labs:     Recent Labs   Lab 07/06/22  0341 07/05/22  0237 07/04/22  0432   WBC 23.06* 22.46* 19.25*   Hemoglobin 7.4* 7.9* 8.0*   Hematocrit 22.6* 24.4* 24.2*   Platelet Count 281 213 174     Recent Labs   Lab 07/06/22  0341 07/05/22  0237 07/04/22  0432 07/03/22  1412 07/03/22  0347 07/02/22  1749   Sodium 139 143 141  More results in Results Review 136 137   Potassium 4.5 4.4 3.9  More results in Results Review 4.3 4.5   Chloride 106 106 104  More results in Results Review 106 105   CO2 21 23 24   More results in Results Review 23 26   BUN 79* 50* 29*  More results in Results Review 23* 13   Creatinine 5.3* 4.0* 2.6*  More results in Results Review 1.9* 1.2*   Calcium 7.7* 7.6* 7.9  More results in Results Review 10.4* 10.6*   Albumin 1.3*  --   --   --  1.4*  1.4* 1.4*   Phosphorus 4.2 3.4 2.0*  More results in Results Review 1.3* 1.4*   Magnesium 2.3 2.2 2.1  More results in Results Review 2.4  --    Glucose 210* 200* 206*  More results in Results Review 238* 196*   GFR 7.5* 10.5* 17.5*  More results in Results  Review 25.6* 44.4*   More results in Results Review = values in this interval not displayed.           Signed by: Delice Bison, MD, MD

## 2022-07-06 NOTE — Plan of Care (Addendum)
Neuro: Patient is responds to pain, does not follow commands. Prn oxycodone and tylenol for pain. Bilateral soft mitt restraints to prevent removal of life sustaining devices   CV: Normal sinus rhythm, BP: 140s-160s systolic, PRN labetalol   PULM: room air dminished lung sounds   GI: Glucerna 1.5 at 15 ml/hr (goal is 46ml/hr) with water flush every 4 hours   ENDO: q4 blood glucose check   GU: Anuric   ID: [id number]   INTEG: Intact, wound vac, wound care following   Plan: Start tube feeding   Problem: Moderate/High Fall Risk Score >5  Goal: Patient will remain free of falls  Flowsheets (Taken 07/06/2022 0800)  High (Greater than 13):   HIGH-Visual cue at entrance to patient's room   HIGH-Bed alarm on at all times while patient in bed   HIGH-Initiate use of floor mats as appropriate     Problem: Non-Violent Restraints Interdisciplinary Plan  Goal: Will be injury free during the use of non-violent restraints  Flowsheets (Taken 07/06/2022 0656 by Claiborne Rigg, RN)  Will be injury free during the use of non-violent restraints:   Attempt all alternatives before use of restraints   Initiate least restrictive type of restraint that is effective   Provide and maintain safe environment   Notify family of initiation of restraints   Include patient/family/caregiver in decisions related to safety   Ensure safety devices are properly applied and maintained   Document observed patient actions according to protocol   Nurse to accompany patient off unit when on restraints   Remove restraints before the indicated maximum length of time when meets criteria for discontinuation   Document significant changes in patient condition   Provide debriefing as soon as possible and appropriate   Reassess need for continued restraints   Ensure that order for restraints has not expired     Problem: SCIP  Goal: SCIP measures are followed  Flowsheets (Taken 06/29/2022 1023 by Luz Brazen, RN)  SCIP measures are followed:   Administer  antibiotics as ordered   VTE Prevention: Administer anticoagulant(s) and/or apply anti-embolism stockings/devices as ordered     Problem: Inadequate Airway Clearance  Goal: Patent Airway maintained  Flowsheets (Taken 07/06/2022 0810 by Claiborne Rigg, RN)  Patent airway maintained:   Position patient for maximum ventilatory efficiency   Provide adequate fluid intake to liquefy secretions   Suction secretions as needed   Reposition patient every 2 hours and as needed unless able to self-reposition  Goal: Normal respiratory rate/effort achieved/maintained  Flowsheets (Taken 07/04/2022 0312 by Elwyn Reach, RN)  Normal respiratory rate/effort achieved/maintained: Plan activities to conserve energy: plan rest periods     Problem: Infection Prevention  Goal: Free from infection  Flowsheets (Taken 07/06/2022 0802 by Claiborne Rigg, RN)  Free from infection:   Monitor/assess vital signs   Encourage/assist patient to turn, cough and perform deep breathing every 2 hours   Assess incision for evidence of healing   Monitor/assess output from surgical drain if present   Assess for signs and symptoms of infection   Monitor/assess lab values and report abnormal values     Problem: Constipation  Goal: Fluid and electrolyte balance are achieved/maintained  Flowsheets (Taken 07/05/2022 0325 by Raynelle Chary, RN)  Fluid and electrolyte balance are achieved/maintained:   Monitor/assess lab values and report abnormal values   Assess and reassess fluid and electrolyte status   Observe for cardiac arrhythmias   Monitor for muscle weakness     Problem: Safety  Goal:  Patient will be free from injury during hospitalization  Flowsheets (Taken 07/06/2022 0802 by Claiborne Rigg, RN)  Patient will be free from injury during hospitalization:   Assess patient's risk for falls and implement fall prevention plan of care per policy   Provide and maintain safe environment   Use appropriate transfer methods   Ensure appropriate safety devices are  available at the bedside  Goal: Patient will be free from infection during hospitalization  Flowsheets (Taken 07/06/2022 0810 by Claiborne Rigg, RN)  Free from Infection during hospitalization:   Monitor all insertion sites (i.e. indwelling lines, tubes, urinary catheters, and drains)   Monitor lab/diagnostic results   Assess and monitor for signs and symptoms of infection   Encourage patient and family to use good hand hygiene technique     Problem: Hemodynamic Status: Cardiac  Goal: Stable vital signs and fluid balance  Flowsheets (Taken 07/06/2022 0810 by Claiborne Rigg, RN)  Stable vital signs and fluid balance:   Assess signs and symptoms associated with cardiac rhythm changes   Monitor lab values     Problem: Renal Instability  Goal: Fluid and electrolyte balance are achieved/maintained  Flowsheets (Taken 07/06/2022 1009 by Bunnie Philips, RN)  Fluid and electrolyte balance are achieved/maintained:   Monitor/assess lab values and report abnormal values   Assess and reassess fluid and electrolyte status   Observe for cardiac arrhythmias   Monitor for muscle weakness   Follow fluid restrictions/IV/PO parameters     Problem: Patient Receiving Advanced Renal Therapies  Goal: Therapy access site remains intact  Flowsheets (Taken 07/06/2022 1009 by Bunnie Philips, RN)  Therapy access site remains intact:   Assess therapy access site   Change therapy access site dressing as needed     Problem: Fluid and Electrolyte Imbalance/ Endocrine  Goal: Fluid and electrolyte balance are achieved/maintained  Flowsheets (Taken 07/05/2022 0325 by Raynelle Chary, RN)  Fluid and electrolyte balance are achieved/maintained:   Monitor/assess lab values and report abnormal values   Assess and reassess fluid and electrolyte status   Observe for cardiac arrhythmias   Monitor for muscle weakness     Problem: Diabetes: Glucose Imbalance  Goal: Blood glucose stable at established goal  Flowsheets (Taken 07/06/2022 0810 by Claiborne Rigg,  RN)  Blood glucose stable at established goal:   Monitor lab values   Monitor intake and output.  Notify LIP if urine output is < 30 mL/hour.   Follow fluid restrictions/IV/PO parameters   Include patient/family in decisions related to nutrition/dietary selections   Assess for hypoglycemia /hyperglycemia   Monitor/assess vital signs   Coordinate medication administration with meals, as indicated   Ensure appropriate diet and assess tolerance   Ensure adequate hydration     Problem: Inadequate Gas Exchange  Goal: Patent Airway maintained  Flowsheets (Taken 07/06/2022 0810 by Claiborne Rigg, RN)  Patent airway maintained:   Position patient for maximum ventilatory efficiency   Provide adequate fluid intake to liquefy secretions   Suction secretions as needed   Reposition patient every 2 hours and as needed unless able to self-reposition

## 2022-07-06 NOTE — Progress Notes (Signed)
LOS # 8       Summary of Discharge Plan:Pt to discharge Home with [facility] services vs LTAC vs TBD        Identified Possible Discharge Barriers:Medical stability ( persistent encephalopathy; now hypertensive; will need permcath exchange this week)        CM Interventions and Outcome: CM reviewed pt's chart and sent referral for LTAC        Discussed above Discharge Plan with (patient, family, Care Team, others): Care Team        Case Management will continue to follow for [place] needs that may arise.       Marylou Mccoy RN BSN CM I  [facility]  [phone]

## 2022-07-07 ENCOUNTER — Inpatient Hospital Stay: Payer: No Typology Code available for payment source

## 2022-07-07 DIAGNOSIS — Z992 Dependence on renal dialysis: Secondary | ICD-10-CM

## 2022-07-07 DIAGNOSIS — Z515 Encounter for palliative care: Secondary | ICD-10-CM

## 2022-07-07 DIAGNOSIS — Z7189 Other specified counseling: Secondary | ICD-10-CM

## 2022-07-07 LAB — BASIC METABOLIC PANEL
Anion Gap: 13 (ref 5.0–15.0)
BUN: 68 mg/dL — ABNORMAL HIGH (ref 7–21)
CO2: 21 mEq/L (ref 17–29)
Calcium: 8.1 mg/dL (ref 7.9–10.2)
Chloride: 104 mEq/L (ref 99–111)
Creatinine: 4.2 mg/dL — ABNORMAL HIGH (ref 0.4–1.0)
GFR: 9.9 mL/min/{1.73_m2} — ABNORMAL LOW (ref >=60.0)
Glucose: 230 mg/dL — ABNORMAL HIGH (ref 70–100)
Potassium: 3.8 mEq/L (ref 3.5–5.3)
Sodium: 138 mEq/L (ref 135–145)

## 2022-07-07 LAB — SURGICAL PATHOLOGY

## 2022-07-07 LAB — CBC WITH MANUAL DIFFERENTIAL
Absolute Bands: 0.31 10*3/uL (ref 0.00–1.00)
Absolute Basophils: 0 10*3/uL (ref 0.00–0.08)
Absolute Eosinophils: 0 10*3/uL (ref 0.00–0.44)
Absolute Lymphocytes: 2.52 10*3/uL (ref 0.42–3.22)
Absolute Monocytes: 1.57 10*3/uL — ABNORMAL HIGH (ref 0.21–0.85)
Absolute Myelocytes: 0.31 10*3/uL — ABNORMAL HIGH (ref <=0.00)
Absolute Neutrophils: 26.72 10*3/uL — ABNORMAL HIGH (ref 1.10–6.33)
Absolute nRBC: 0.18 10*3/uL — ABNORMAL HIGH (ref <=0.00)
Bands %: 1 %
Basophils %: 0 %
Eosinophils %: 0 %
Hematocrit: 21.1 % — ABNORMAL LOW (ref 34.7–43.7)
Hemoglobin: 6.6 g/dL — ABNORMAL LOW (ref 11.4–14.8)
Lymphocytes %: 8 %
MCH: 30 pg (ref 25.1–33.5)
MCHC: 31.3 g/dL — ABNORMAL LOW (ref 31.5–35.8)
MCV: 95.9 fL (ref 78.0–96.0)
MPV: 10.8 fL (ref 8.9–12.5)
Monocytes %: 5 %
Myelocytes %: 1 %
Neutrophils %: 85 %
Platelet Count: 309 10*3/uL (ref 142–346)
Platelet Estimate: NORMAL
RBC Morphology: NORMAL
RBC: 2.2 10*6/uL — ABNORMAL LOW (ref 3.90–5.10)
RDW: 16 % — ABNORMAL HIGH (ref 11–15)
WBC: 31.44 10*3/uL — ABNORMAL HIGH (ref 3.10–9.50)
nRBC %: 0.6 /100 WBC — ABNORMAL HIGH (ref <=0.0)

## 2022-07-07 LAB — WHOLE BLOOD GLUCOSE POCT
Whole Blood Glucose POCT: 197 mg/dL — ABNORMAL HIGH (ref 70–100)
Whole Blood Glucose POCT: 220 mg/dL — ABNORMAL HIGH (ref 70–100)
Whole Blood Glucose POCT: 227 mg/dL — ABNORMAL HIGH (ref 70–100)
Whole Blood Glucose POCT: 232 mg/dL — ABNORMAL HIGH (ref 70–100)
Whole Blood Glucose POCT: 248 mg/dL — ABNORMAL HIGH (ref 70–100)
Whole Blood Glucose POCT: 264 mg/dL — ABNORMAL HIGH (ref 70–100)
Whole Blood Glucose POCT: 286 mg/dL — ABNORMAL HIGH (ref 70–100)

## 2022-07-07 LAB — COMPREHENSIVE METABOLIC PANEL
ALT: 15 U/L (ref 0–55)
AST (SGOT): 23 U/L (ref 5–41)
Albumin/Globulin Ratio: 0.5 — ABNORMAL LOW (ref 0.9–2.2)
Albumin: 1.4 g/dL — ABNORMAL LOW (ref 3.5–5.0)
Alkaline Phosphatase: 69 U/L (ref 37–117)
Anion Gap: 13 (ref 5.0–15.0)
BUN: 94 mg/dL — ABNORMAL HIGH (ref 7–21)
Bilirubin, Total: 0.1 mg/dL — ABNORMAL LOW (ref 0.2–1.2)
CO2: 18 mEq/L (ref 17–29)
Calcium: 8.2 mg/dL (ref 7.9–10.2)
Chloride: 103 mEq/L (ref 99–111)
Creatinine: 5.3 mg/dL — ABNORMAL HIGH (ref 0.4–1.0)
GFR: 7.5 mL/min/{1.73_m2} — ABNORMAL LOW (ref >=60.0)
Globulin: 2.7 g/dL (ref 2.0–3.6)
Glucose: 277 mg/dL — ABNORMAL HIGH (ref 70–100)
Potassium: 4 mEq/L (ref 3.5–5.3)
Protein, Total: 4.1 g/dL — ABNORMAL LOW (ref 6.0–8.3)
Sodium: 134 mEq/L — ABNORMAL LOW (ref 135–145)

## 2022-07-07 LAB — CBC
Absolute nRBC: 0.09 10*3/uL — ABNORMAL HIGH (ref <=0.00)
Absolute nRBC: 0.06 10*3/uL — ABNORMAL HIGH (ref <=0.00)
Hematocrit: 22.2 % — ABNORMAL LOW (ref 34.7–43.7)
Hematocrit: 22.8 % — ABNORMAL LOW (ref 34.7–43.7)
Hemoglobin: 7.3 g/dL — ABNORMAL LOW (ref 11.4–14.8)
Hemoglobin: 7.6 g/dL — ABNORMAL LOW (ref 11.4–14.8)
MCH: 30.5 pg (ref 25.1–33.5)
MCH: 31 pg (ref 25.1–33.5)
MCHC: 32.9 g/dL (ref 31.5–35.8)
MCHC: 33.3 g/dL (ref 31.5–35.8)
MCV: 92.9 fL (ref 78.0–96.0)
MCV: 93.1 fL (ref 78.0–96.0)
MPV: 10.5 fL (ref 8.9–12.5)
MPV: 10.6 fL (ref 8.9–12.5)
Platelet Count: 321 10*3/uL (ref 142–346)
Platelet Count: 358 10*3/uL — ABNORMAL HIGH (ref 142–346)
RBC: 2.39 10*6/uL — ABNORMAL LOW (ref 3.90–5.10)
RBC: 2.45 10*6/uL — ABNORMAL LOW (ref 3.90–5.10)
RDW: 16 % — ABNORMAL HIGH (ref 11–15)
RDW: 16 % — ABNORMAL HIGH (ref 11–15)
WBC: 30.49 10*3/uL — ABNORMAL HIGH (ref 3.10–9.50)
WBC: 31.38 10*3/uL — ABNORMAL HIGH (ref 3.10–9.50)
nRBC %: 0.3 /100 WBC — ABNORMAL HIGH (ref <=0.0)
nRBC %: 0.2 /100 WBC — ABNORMAL HIGH (ref <=0.0)

## 2022-07-07 LAB — LACTIC ACID: Whole Blood Lactic Acid: 3.3 mmol/L — ABNORMAL HIGH (ref 0.2–2.0)

## 2022-07-07 LAB — MAGNESIUM
Magnesium: 2.1 mg/dL (ref 1.6–2.6)
Magnesium: 2.3 mg/dL (ref 1.6–2.6)

## 2022-07-07 LAB — PHOSPHORUS: Phosphorus: 6.5 mg/dL — ABNORMAL HIGH (ref 2.3–4.7)

## 2022-07-07 MED ORDER — ALBUMIN HUMAN/BIOSIMILIAR 5% IV SOLN (WRAP)
25.0000 g | Freq: Once | INTRAVENOUS | Status: DC
Start: 2022-07-07 — End: 2022-07-07

## 2022-07-07 MED ORDER — MIDODRINE HCL 5 MG PO TABS
5.0000 mg | ORAL_TABLET | Freq: Once | ORAL | Status: AC
Start: 2022-07-07 — End: 2022-07-07
  Administered 2022-07-07: 5 mg via ORAL
  Filled 2022-07-07: qty 1

## 2022-07-07 MED ORDER — PANTOPRAZOLE SODIUM 40 MG IV SOLR
40.0000 mg | Freq: Every day | INTRAVENOUS | Status: DC
Start: 2022-07-07 — End: 2022-07-08
  Administered 2022-07-07 – 2022-07-08 (×2): 40 mg via INTRAVENOUS
  Filled 2022-07-07 (×2): qty 40

## 2022-07-07 MED ORDER — SODIUM CHLORIDE 0.9 % IV SOLN
INTRAVENOUS | Status: DC | PRN
Start: 2022-07-07 — End: 2022-07-09

## 2022-07-07 MED ORDER — BUMETANIDE 0.25 MG/ML IJ SOLN
3.0000 mg | Freq: Once | INTRAMUSCULAR | Status: AC
Start: 2022-07-07 — End: 2022-07-07
  Administered 2022-07-07: 3 mg via INTRAVENOUS
  Filled 2022-07-07: qty 12

## 2022-07-07 MED ORDER — ALBUTEROL-IPRATROPIUM 2.5-0.5 (3) MG/3ML IN SOLN
3.0000 mL | RESPIRATORY_TRACT | Status: DC | PRN
Start: 2022-07-07 — End: 2022-07-07
  Administered 2022-07-07: 3 mL via RESPIRATORY_TRACT
  Filled 2022-07-07: qty 3

## 2022-07-07 MED ORDER — ALBUTEROL-IPRATROPIUM 2.5-0.5 (3) MG/3ML IN SOLN
3.0000 mL | Freq: Four times a day (QID) | RESPIRATORY_TRACT | Status: DC
Start: 2022-07-07 — End: 2022-07-10
  Administered 2022-07-07 – 2022-07-10 (×13): 3 mL via RESPIRATORY_TRACT
  Filled 2022-07-07 (×14): qty 3

## 2022-07-07 MED ORDER — ALBUMIN HUMAN/BIOSIMILIAR 5% IV SOLN (WRAP)
25.0000 g | Freq: Once | INTRAVENOUS | Status: AC
Start: 2022-07-07 — End: 2022-07-07
  Administered 2022-07-07: 25 g via INTRAVENOUS
  Filled 2022-07-07: qty 500

## 2022-07-07 MED ORDER — INSULIN GLARGINE 100 UNIT/ML SC SOLN
7.0000 [IU] | Freq: Every morning | SUBCUTANEOUS | Status: DC
Start: 2022-07-07 — End: 2022-07-29
  Administered 2022-07-07 – 2022-07-29 (×19): 7 [IU] via SUBCUTANEOUS
  Filled 2022-07-07 (×20): qty 7

## 2022-07-07 NOTE — Plan of Care (Incomplete Revision)
Bilat soft mitts on to prevent removal of life sustaining equipments, pt daughter, [name] at bedside and verbalized understanding.     Problem: Non-Violent Restraints Interdisciplinary Plan  Goal: Will be injury free during the use of non-violent restraints  Outcome: Progressing  Flowsheets (Taken 07/06/2022 0656 by Claiborne Rigg, RN)  Will be injury free during the use of non-violent restraints:   Attempt all alternatives before use of restraints   Initiate least restrictive type of restraint that is effective   Provide and maintain safe environment   Notify family of initiation of restraints   Include patient/family/caregiver in decisions related to safety   Ensure safety devices are properly applied and maintained   Document observed patient actions according to protocol   Nurse to accompany patient off unit when on restraints   Remove restraints before the indicated maximum length of time when meets criteria for discontinuation   Document significant changes in patient condition   Provide debriefing as soon as possible and appropriate   Reassess need for continued restraints   Ensure that order for restraints has not expired

## 2022-07-07 NOTE — SLP Eval Note (Signed)
[facility]    Speech Therapy Treatment Note Attempt   Patient: ADISA DEEGAN MRN#: [id number]   Unit: MED SURG ICU 1 Room/Bed: A2910/A2910-01       Attempted to see patient at approximately 1033.        SLP Order Cancellation Reason: Not clinically indicated at this time (comment required) - Rehab decision .  SLP orders acknowledged with second attempt to complete swallow eval.  Per cardiology progress note yesterday: "She is delirious and cannot participate in her exam/interview.  She has worsening encephalopathy and gram negative rods in her respiratory culture."  NGT placed this a.m.    Pt encountered b/s, somnolent.  Dtr [name]) at b/s who verbalized comprehension re rationale for NGT but expressed concerns re lack of verbal communication s/p extubation last Thursday.    SLP made decision to again defer swallow eval d/t lack of appropriateness (ie, persistent pt somnolence); pt is currently and appropriately receiving nutritional support via TF.  RN consultation completed (who stated pt has been lethargic x3 days) with agreement to withhold swallow eval at this time.  Dtr educated further with comprehension verbalized.    Please f/u with SLP services as appropriate.        07/07/2022   Gerri Lins, MS, CCC-SLP  [phone]

## 2022-07-07 NOTE — Plan of Care (Deleted)
At this time, Shelby Barron has no further general surgery needs. Surgery to sign off at this time, please reach out with any additional questions or concerns. Remainder of care per primary.     Dion Saucier, MD  General Surgery, PGY-3

## 2022-07-07 NOTE — Plan of Care (Signed)
Bilat soft mitts on to prevent removal of life sustaining equipments, pt daughter at bedside and verbalized understanding. Pt confused & restless.    Problem: Non-Violent Restraints Interdisciplinary Plan  Goal: Will be injury free during the use of non-violent restraints  Outcome: Progressing  Flowsheets (Taken 07/06/2022 0656 by Claiborne Rigg, RN)  Will be injury free during the use of non-violent restraints:   Attempt all alternatives before use of restraints   Initiate least restrictive type of restraint that is effective   Provide and maintain safe environment   Notify family of initiation of restraints   Include patient/family/caregiver in decisions related to safety   Ensure safety devices are properly applied and maintained   Document observed patient actions according to protocol   Nurse to accompany patient off unit when on restraints   Remove restraints before the indicated maximum length of time when meets criteria for discontinuation   Document significant changes in patient condition   Provide debriefing as soon as possible and appropriate   Reassess need for continued restraints   Ensure that order for restraints has not expired

## 2022-07-07 NOTE — Progress Notes (Signed)
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 s/p bilateral radical nephrectomies and  cystectomy on 06/28/22  - complicated by small bowel enterotomy and blood loss   New onset afib - ICU consulted  High grade urothelial bladder CA: s/p bilateral nephrectomies and cystectomy c/b mall bowel enterotomy and blood loss (~1L) on 06/28/22  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 (approx 1L) - stable  Hypophosphatemia  - better  Encephalopathy - persists  Leukocytosis - worse despite being on abx     Recommendations:   Tentative plan for dialysis tomorrow  If bcx are +, would remove both quinton and old permacath and plan for 48hr line holiday - would plan for new quinton on Friday and dialysis   Recommend EKG      Case discussed with: daughter, HR RN, bedside RN, Dr. Francisco Capuchin via epic chat       Delice Bison, MD, MD  [facility]  [phone] (office)  X [phone] (FFX Spectra-Link)      Subjective:  NGT placed  Per chart review, pt converted back to NSR yesterday afternoon; today her HR 110s  Review of Systems:   Unable to assess, patient is confused     Physical Exam:     Vitals:    07/07/22 0400 07/07/22 0600 07/07/22 0601 07/07/22 0802   BP: 142/67  123/59    Pulse: 98  (!) 101    Resp: (!) 33  (!) 40    Temp:    98.6 F (37 C)   TempSrc:    Axillary   SpO2: 97%  97%    Weight:  90.4 kg (199 lb 4.7 oz)     Height:           Intake and Output Summary (Last 24 hours) at Date Time    Intake/Output Summary (Last 24 hours) at 07/07/2022  0834  Last data filed at 07/07/2022 0400  Gross per 24 hour   Intake 175 ml   Output 729 ml   Net -554 ml       General: confused  Cardiovascular: regular rate and rhythm  Lungs: bilateral air entry  Abdomen: soft  Extremities: edema noted in her hands    Access: R quinton, old permacath    Meds:      Scheduled Meds: PRN Meds:    acetaminophen, 650 mg, Oral, 4 times per day  heparin (porcine), 5,000 Units, Subcutaneous, Q12H SCH  insulin lispro, 2-10 Units, Subcutaneous, Q4H SCH  meropenem, 500 mg, Intravenous, Q24H  metoprolol tartrate, 25 mg, Oral, Q12H SCH  thiamine, 100 mg, Intravenous, Q24H SCH          Continuous Infusions:     albuterol-ipratropium, 3 mL, Q6H PRN  benzocaine, 1 spray, TID PRN  dextrose, 15 g of glucose, PRN   Or  dextrose, 12.5 g, PRN   Or  dextrose, 12.5 g, PRN   Or  glucagon (rDNA), 1 mg, PRN  diphenhydrAMINE, 25 mg, Q6H PRN  labetalol, 10 mg, Q2H PRN  lidocaine 2% jelly, , Once PRN  naloxone, 0.2 mg, PRN  oxyCODONE, 5 mg, Q6H PRN  phenol, 1 spray, Q2H PRN              Labs:     Recent Labs   Lab 07/07/22  0358 07/07/22  0044 07/06/22  0341   WBC 31.38* 30.49* 23.06*   Hemoglobin 7.6* 7.3* 7.4*   Hematocrit 22.8* 22.2* 22.6*   Platelet Count 358* 321 281     Recent Labs   Lab 07/07/22  0358 07/06/22  1545 07/06/22  0341 07/05/22  0237 07/04/22  0432 07/03/22  1412 07/03/22  0347 07/02/22  1749   Sodium 138 139 139 143 141  More results in Results Review 136 137   Potassium 3.8 3.8 4.5 4.4 3.9  More results in Results Review 4.3 4.5   Chloride 104 103 106 106 104  More results in Results Review 106 105   CO2 21 23 21 23 24   More results in Results Review 23 26   BUN 68* 49* 79* 50* 29*  More results in Results Review 23* 13   Creatinine 4.2* 3.4* 5.3* 4.0* 2.6*  More results in Results Review 1.9* 1.2*   Calcium 8.1 7.8* 7.7* 7.6* 7.9  More results in Results Review 10.4* 10.6*   Albumin  --   --  1.3*  --   --   --  1.4*  1.4* 1.4*   Phosphorus  --   --  4.2 3.4 2.0*  More results in  Results Review 1.3* 1.4*   Magnesium 2.1  --  2.3 2.2 2.1  More results in Results Review 2.4  --    Glucose 230* 200* 210* 200* 206*  More results in Results Review 238* 196*   GFR 9.9* 12.7* 7.5* 10.5* 17.5*  More results in Results Review 25.6* 44.4*   More results in Results Review = values in this interval not displayed.           Signed by: Delice Bison, MD, MD

## 2022-07-07 NOTE — Plan of Care (Signed)
Bilat soft mitts on to prevent removal of life sustaining equipments, pt daughter at bedside and verbalized understanding. Pt confused & restless. Pain management. NSR/Afib. HR: 80 - 100's. SBP: 120 - 150's. RA, diminished LS. Frequent oral suctioning done. ND, TF running. X3 loose dark tarry stool. Evorn Gong, PA made aware, CBC checked early Hgb 7.3. Anuric. Abdominal wound vac. Purposeful rounding completed. Safety & fall precautions remain in place.    Problem: SCIP  Goal: SCIP measures are followed  Outcome: Progressing  Flowsheets (Taken 06/29/2022 1023 by Luz Brazen, RN)  SCIP measures are followed:   Administer antibiotics as ordered   VTE Prevention: Administer anticoagulant(s) and/or apply anti-embolism stockings/devices as ordered     Problem: Inadequate Airway Clearance  Goal: Patent Airway maintained  Outcome: Progressing  Flowsheets (Taken 07/06/2022 0810 by Claiborne Rigg, RN)  Patent airway maintained:   Position patient for maximum ventilatory efficiency   Provide adequate fluid intake to liquefy secretions   Suction secretions as needed   Reposition patient every 2 hours and as needed unless able to self-reposition     Problem: Constipation  Goal: Fluid and electrolyte balance are achieved/maintained  Outcome: Progressing  Flowsheets (Taken 07/05/2022 0325 by Raynelle Chary, RN)  Fluid and electrolyte balance are achieved/maintained:   Monitor/assess lab values and report abnormal values   Assess and reassess fluid and electrolyte status   Observe for cardiac arrhythmias   Monitor for muscle weakness     Problem: Safety  Goal: Patient will be free from injury during hospitalization  Outcome: Progressing  Flowsheets (Taken 07/06/2022 0802 by Claiborne Rigg, RN)  Patient will be free from injury during hospitalization:   Assess patient's risk for falls and implement fall prevention plan of care per policy   Provide and maintain safe environment   Use appropriate transfer methods   Ensure  appropriate safety devices are available at the bedside     Problem: Pain  Goal: Pain at adequate level as identified by patient  Outcome: Progressing  Flowsheets (Taken 07/06/2022 0810 by Claiborne Rigg, RN)  Pain at adequate level as identified by patient:   Identify patient comfort function goal   Assess for risk of opioid induced respiratory depression, including snoring/sleep apnea. Alert healthcare team of risk factors identified.   Assess pain on admission, during daily assessment and/or before any "as needed" intervention(s)   Reassess pain within 30-60 minutes of any procedure/intervention, per Pain Assessment, Intervention, Reassessment (AIR) Cycle   Evaluate if patient comfort function goal is met   Evaluate patient's satisfaction with pain management progress   Offer non-pharmacological pain management interventions     Problem: Hemodynamic Status: Cardiac  Goal: Stable vital signs and fluid balance  Outcome: Progressing  Flowsheets (Taken 07/06/2022 0810 by Claiborne Rigg, RN)  Stable vital signs and fluid balance:   Assess signs and symptoms associated with cardiac rhythm changes   Monitor lab values     Problem: Renal Instability  Goal: Fluid and electrolyte balance are achieved/maintained  Outcome: Progressing  Flowsheets (Taken 07/06/2022 1009 by Bunnie Philips, RN)  Fluid and electrolyte balance are achieved/maintained:   Monitor/assess lab values and report abnormal values   Assess and reassess fluid and electrolyte status   Observe for cardiac arrhythmias   Monitor for muscle weakness   Follow fluid restrictions/IV/PO parameters     Problem: Fluid and Electrolyte Imbalance/ Endocrine  Goal: Fluid and electrolyte balance are achieved/maintained  Outcome: Progressing  Flowsheets (Taken 07/05/2022 0325 by Raynelle Chary, RN)  Fluid and electrolyte balance are achieved/maintained:   Monitor/assess lab values and report abnormal values   Assess and reassess fluid and electrolyte status   Observe for  cardiac arrhythmias   Monitor for muscle weakness     Problem: Diabetes: Glucose Imbalance  Goal: Blood glucose stable at established goal  Outcome: Progressing  Flowsheets (Taken 07/06/2022 0810 by Claiborne Rigg, RN)  Blood glucose stable at established goal:   Monitor lab values   Monitor intake and output.  Notify LIP if urine output is < 30 mL/hour.   Follow fluid restrictions/IV/PO parameters   Include patient/family in decisions related to nutrition/dietary selections   Assess for hypoglycemia /hyperglycemia   Monitor/assess vital signs   Coordinate medication administration with meals, as indicated   Ensure appropriate diet and assess tolerance   Ensure adequate hydration     Problem: Inadequate Gas Exchange  Goal: Patent Airway maintained  Outcome: Progressing  Flowsheets (Taken 07/06/2022 0810 by Claiborne Rigg, RN)  Patent airway maintained:   Position patient for maximum ventilatory efficiency   Provide adequate fluid intake to liquefy secretions   Suction secretions as needed   Reposition patient every 2 hours and as needed unless able to self-reposition

## 2022-07-07 NOTE — Progress Notes (Cosign Needed)
4 eyes in 4 hours pressure injury assessment note:      Completed with:   Unit & Time admitted:              Bony Prominences: Check appropriate box; if wound is present enter wound assessment in LDA     Occiput:                 [x] WNL  []  Wound present  Face:                     [x] WNL  []  Wound present  Ears:                      [x] WNL  []  Wound present  Spine:                    [x] WNL  []  Wound present  Shoulders:             [x] WNL  []  Wound present  Elbows:                  [x] WNL  []  Wound present  Sacrum/coccyx:     [] WNL  []  Wound present MASD perianal due to LBM  Ischial Tuberosity:  [x] WNL  []  Wound present  Trochanter/Hip:      [x] WNL  []  Wound present  Knees:                   [x] WNL  []  Wound present  Ankles:                   [x] WNL  []  Wound present  Heels:                    [x] WNL  []  Wound present  Other pressure areas:  []  Wound location       Device related: []  Device name:         LDA completed if wound present: yes/no  Consult WOCN if necessary    Other skin related issues, ie tears, rash, etc, document in Integumentary flowsheet

## 2022-07-07 NOTE — Plan of Care (Signed)
Received from night shift and identified as per policy  Assessment done and documented  Pt is awake but does not follow commands, non verbal  Nasal cannula at 2l/min; increased work of breathing and tachypneic, informed RT; and RT did deep suctioning with dark colored content- increased o2 to 4L/min via NC   Afib with HR 80-110s, SBP 100-149mmhg  Enteral feeding of glucerna at 29ml/hr; hold at 1600H due to increased SOB, pulmonary congestion and diarrhea; hospitalist made aware with new orders carried out  Anuric; on regular HD  Rectal tube inserted as ordered due to loose BM  On bilateral mitt wrist restraints to prevent patient from interference of medical treatment.  Skin is not intact; abdominal incision, MASD and scattered bruising     Problem: Moderate/High Fall Risk Score >5  Goal: Patient will remain free of falls  Outcome: Progressing  Flowsheets  Taken 07/07/2022 0800 by Verita Lamb, RN  High (Greater than 13):   HIGH-Visual cue at entrance to patient's room   HIGH-Bed alarm on at all times while patient in bed   HIGH-Apply yellow "Fall Risk" arm band   HIGH-Pharmacy to initiate evaluation and intervention per protocol   HIGH-Consider use of low bed   HIGH-Utilize chair pad alarm for patient while in the chair   HIGH-Initiate use of floor mats as appropriate  Taken 07/05/2022 0325 by Raynelle Chary, RN  VH High Risk (Greater than 13):   ALL REQUIRED LOW INTERVENTIONS   ALL REQUIRED MODERATE INTERVENTIONS  Taken 07/04/2022 0800 by Michaelle Copas, RN  Moderate Risk (6-13): MOD-Apply bed exit alarm if patient is confused     Problem: Non-Violent Restraints Interdisciplinary Plan  Goal: Will be injury free during the use of non-violent restraints  Outcome: Progressing  Flowsheets (Taken 07/06/2022 0656 by Claiborne Rigg, RN)  Will be injury free during the use of non-violent restraints:   Attempt all alternatives before use of restraints   Initiate least restrictive type of restraint that is  effective   Provide and maintain safe environment   Notify family of initiation of restraints   Include patient/family/caregiver in decisions related to safety   Ensure safety devices are properly applied and maintained   Document observed patient actions according to protocol   Nurse to accompany patient off unit when on restraints   Remove restraints before the indicated maximum length of time when meets criteria for discontinuation   Document significant changes in patient condition   Provide debriefing as soon as possible and appropriate   Reassess need for continued restraints   Ensure that order for restraints has not expired     Problem: Inadequate Airway Clearance  Goal: Patent Airway maintained  Outcome: Progressing  Flowsheets (Taken 07/06/2022 0810 by Claiborne Rigg, RN)  Patent airway maintained:   Position patient for maximum ventilatory efficiency   Provide adequate fluid intake to liquefy secretions   Suction secretions as needed   Reposition patient every 2 hours and as needed unless able to self-reposition     Problem: Safety  Goal: Patient will be free from injury during hospitalization  Outcome: Progressing  Flowsheets (Taken 07/06/2022 0802 by Claiborne Rigg, RN)  Patient will be free from injury during hospitalization:   Assess patient's risk for falls and implement fall prevention plan of care per policy   Provide and maintain safe environment   Use appropriate transfer methods   Ensure appropriate safety devices are available at the bedside     Problem: Safety  Goal: Patient  will be free from infection during hospitalization  Outcome: Progressing  Flowsheets (Taken 07/06/2022 0810 by Claiborne Rigg, RN)  Free from Infection during hospitalization:   Monitor all insertion sites (i.e. indwelling lines, tubes, urinary catheters, and drains)   Monitor lab/diagnostic results   Assess and monitor for signs and symptoms of infection   Encourage patient and family to use good hand hygiene technique      Problem: Pain  Goal: Pain at adequate level as identified by patient  Outcome: Progressing  Flowsheets (Taken 07/06/2022 0810 by Claiborne Rigg, RN)  Pain at adequate level as identified by patient:   Identify patient comfort function goal   Assess for risk of opioid induced respiratory depression, including snoring/sleep apnea. Alert healthcare team of risk factors identified.   Assess pain on admission, during daily assessment and/or before any "as needed" intervention(s)   Reassess pain within 30-60 minutes of any procedure/intervention, per Pain Assessment, Intervention, Reassessment (AIR) Cycle   Evaluate if patient comfort function goal is met   Evaluate patient's satisfaction with pain management progress   Offer non-pharmacological pain management interventions     Problem: Hemodynamic Status: Cardiac  Goal: Stable vital signs and fluid balance  Outcome: Progressing  Flowsheets (Taken 07/06/2022 0810 by Claiborne Rigg, RN)  Stable vital signs and fluid balance:   Assess signs and symptoms associated with cardiac rhythm changes   Monitor lab values     Problem: Renal Instability  Goal: Fluid and electrolyte balance are achieved/maintained  Outcome: Progressing  Flowsheets (Taken 07/06/2022 1009 by Bunnie Philips, RN)  Fluid and electrolyte balance are achieved/maintained:   Monitor/assess lab values and report abnormal values   Assess and reassess fluid and electrolyte status   Observe for cardiac arrhythmias   Monitor for muscle weakness   Follow fluid restrictions/IV/PO parameters     Problem: Inadequate Gas Exchange  Goal: Patent Airway maintained  Outcome: Progressing  Flowsheets (Taken 07/06/2022 0810 by Claiborne Rigg, RN)  Patent airway maintained:   Position patient for maximum ventilatory efficiency   Provide adequate fluid intake to liquefy secretions   Suction secretions as needed   Reposition patient every 2 hours and as needed unless able to self-reposition  Goal: Adequate oxygenation and improved  ventilation  Outcome: Progressing  Flowsheets (Taken 07/05/2022 0325 by Raynelle Chary, RN)  Adequate oxygenation and improved ventilation:   Assess lung sounds   Monitor SpO2 and treat as needed   Increase activity as tolerated/progressive mobility   Plan activities to conserve energy: plan rest periods

## 2022-07-07 NOTE — Progress Notes (Signed)
[facility] PROGRESS NOTE      Date of Note: 07/07/2022   Patient Name: Shelby Barron     Date of Birth:  27-Feb-1940     MRN:               [id number]     PCP:               Patsy Lager, MD     I am available on epic chart and Spectra link extension [phone] Monday - Friday 8:30-16:30. If urology consultation is needed outside these hours please contact [facility] at [phone].      ASSESSMENT/  PLAN     -Continue supportive care, appreciate ICU and general surgery input  -no additional urology recs    I will discuss/discussed the plan of care with the following services:  Dr. Astrid Drafts, Urology     I spent a total of 10 minutes involved in this patient's care    Sheldon Silvan, PA  07/07/2022, 2:47 PM    SUBJECTIVE     Interval History: 6/10 radical cystectomy and bilateral nephrectomy with enterotomy  6/12 abdominal wound closure, bowel anastamosis    elevated WBC and tachy. CT no indicative of abscess or leak.       Patient Active Problem List    Diagnosis Date Noted    Anemia 06/30/2022    COPD (chronic obstructive pulmonary disease) 06/30/2022    Bladder cancer 06/28/2022    Malignant neoplasm of urinary bladder, unspecified site 06/25/2022    Malignant tumor of kidney, left 06/25/2022    GERD (gastroesophageal reflux disease) 06/25/2022    Leukocytosis 06/25/2022    Chronic diastolic congestive heart failure 06/23/2022    Aortic atherosclerosis 06/23/2022    Peritonitis 05/26/2022    ESRD (end stage renal disease) 05/26/2022    Hamstring injury 05/10/2013    Benign essential hypertension 02/10/2013    Left renal mass 11/28/2012    Type 2 diabetes mellitus with kidney complication, with long-term current use of insulin 07/31/2010    Diabetes mellitus type II, uncontrolled 07/31/2010    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        OBJECTIVE     Vital Signs: BP 112/56   Pulse 84   Temp 98.2 F (36.8 C) (Axillary)   Resp (!) 35   Ht 1.575 m (5\' 2" )   Wt 90.4 kg (199 lb 4.7 oz)   SpO2 98%   BMI 36.45 kg/m      TMax: Temp (24hrs), Avg:98.3 F (36.8 C), Min:97.6 F (36.4 C), Max:98.8 F (37.1 C)    I/Os:   Intake/Output Summary (Last 24 hours) at 07/07/2022 1447  Last data filed at 07/07/2022 0800  Gross per 24 hour   Intake 210 ml   Output 0 ml   Net 210 ml             LABS & IMAGING     CBC  Recent Labs     07/07/22  0358 07/07/22  0044 07/06/22  0341   WBC 31.38* 30.49* 23.06*   RBC 2.45* 2.39* 2.39*   Hematocrit 22.8* 22.2* 22.6*   MCV 93.1 92.9 94.6   MCH 31.0 30.5 31.0   MCHC 33.3 32.9 32.7   RDW 16* 16* 15   MPV 10.6 10.5 10.5       BMP  Recent Labs     07/07/22  0358 07/06/22  1545 07/06/22  0341   CO2 21 23 21  Anion Gap 13.0 13.0 12.0   BUN 68* 49* 79*   Creatinine 4.2* 3.4* 5.3*       INR  Lab Results   Component Value Date/Time    INR 1.1 07/01/2022 11:20 AM    INR 1.1 06/01/2022 03:15 AM         IMAGING:  XR Chest AP Portable   Final Result    There is mild interstitial prominence suggesting mild   congestive heart failure/fluid overload in the setting of shortness of   breath      [name], MD   07/07/2022 12:32 PM      XR Chest AP Portable   Final Result       Hypoventilation with basilar atelectasis.      Charlott Rakes, MD   07/06/2022 3:35 PM      CT Abdomen Pelvis W IV And PO Cont   Final Result      1.Postoperative changes from bilateral nephrectomy and cystectomy.    2.Small amount of ascites including some areas which appear loculated.   Small collections of fluid and gas in the anterior midline abdomen may be   postoperative but superimposed infection is not excluded. No rim-enhancing   fluid collections or extravasation of oral contrast to suggest anastomotic   leak.      Demetrios Isaacs, MD   07/06/2022 5:51 AM      CT Head WO Contrast   Final Result       1.No acute intracranial abnormality is seen.      Leandro Reasoner, MD   07/06/2022 3:56 AM      XR Abdomen AP   Final Result      Enteric tube terminates in the distal stomach.      Elease Etienne, DO   07/05/2022 12:24 PM      XR Chest AP Portable    Final Result         1. Lines and tubes in adequate position. No pneumothorax.   2. Trace left pleural effusion and bibasilar atelectasis.      Jasmine December D'Heureux, MD   06/30/2022 4:53 PM      XR Chest AP Portable   Final Result      Catheters as described.      Pankaj Dominica, MD   06/30/2022 2:07 PM      XR Chest AP Portable   Final Result    Right mainstem bronchial intubation. The tube should be   withdrawn about 4 cm.      Dr. Tildon Husky is aware.      Wynema Birch, MD   06/28/2022 4:56 PM      XR OR MISCOUNT   Final Result    No unexpected foreign body.      These critical results were discussed with and acknowledged by Angie R.,   the circulating nurse, who will communicate the findings to Everardo All,   MD on 06/28/2022 3:02 PM.         Bosie Helper, MD   06/28/2022 3:03 PM

## 2022-07-07 NOTE — Progress Notes (Addendum)
Infectious Diseases & Tropical Medicine  Progress Note    07/07/2022   Shelby Barron ZOX:[id number] is a 82 y.o. female,       Assessment:     Encephalopathy  History of bladder cancer and renal failure  Status post radical cystectomy/bilateral nephrectomy, enterectomy and lysis of adhesion (06/28/2022)  S/p small bowel resection  Risk of aspiration  Sputum culture-few gram-negative rods (small quantity)-not processed  Leukocytosis-on steroids    Plan:     Switch to meropenem by ICU team  Palliative care following  Follow up chest x-ray  Continue probiotics  Monitor electrolytes and renal functions closely  Monitor clinically  Discussed with nursing staff           ROS:     General:  no fever, unresponsive, NG tube in place  Respiratory: No cough or shortness of breath  Gastrointestinal: no diarrhea  Genito-Urinary: no hematuria  Musculoskeletal: no edema  Neurological: unresponsive  Dermatological: No rash    Physical Examination:     Blood pressure 123/59, pulse (!) 101, temperature 98.6 F (37 C), temperature source Axillary, resp. rate (!) 40, height 1.575 m (5\' 2" ), weight 90.4 kg (199 lb 4.7 oz), SpO2 97%.    General Appearance: Unresponsive  HEENT: Pupils are equal, round, and reactive to light.   Lungs: Decreased breath sounds  Heart: Tachycardia  Chest: Symmetric chest wall expansion.   Abdomen: soft, no hepatosplenomegaly, no bowel sounds  Neurological: unresponsive  Extremities: No edema    Laboratory And Diagnostic Studies:     Recent Labs     07/07/22  0358 07/07/22  0044   WBC 31.38* 30.49*   Hemoglobin 7.6* 7.3*   Hematocrit 22.8* 22.2*   Platelet Count 358* 321     Recent Labs     07/07/22  0358 07/06/22  1545   Sodium 138 139   Potassium 3.8 3.8   Chloride 104 103   CO2 21 23   BUN 68* 49*   Creatinine 4.2* 3.4*   Glucose 230* 200*   Calcium 8.1 7.8*     Recent Labs     07/06/22  0341   Albumin 1.3*       Current Meds:      Scheduled Meds: PRN Meds:    acetaminophen, 650 mg, Oral, 4  times per day  heparin (porcine), 5,000 Units, Subcutaneous, Q12H SCH  insulin lispro, 2-10 Units, Subcutaneous, Q4H SCH  meropenem, 500 mg, Intravenous, Q24H  metoprolol tartrate, 25 mg, Oral, Q12H SCH  thiamine, 100 mg, Intravenous, Q24H SCH        Continuous Infusions:     albuterol-ipratropium, 3 mL, Q6H PRN  benzocaine, 1 spray, TID PRN  dextrose, 15 g of glucose, PRN   Or  dextrose, 12.5 g, PRN   Or  dextrose, 12.5 g, PRN   Or  glucagon (rDNA), 1 mg, PRN  diphenhydrAMINE, 25 mg, Q6H PRN  labetalol, 10 mg, Q2H PRN  lidocaine 2% jelly, , Once PRN  naloxone, 0.2 mg, PRN  oxyCODONE, 5 mg, Q6H PRN  phenol, 1 spray, Q2H PRN          Arianne Klinge A. Janalyn Rouse, M.D.  07/07/2022  9:26 AM

## 2022-07-07 NOTE — Consults (Addendum)
[facility] Palliative Medicine & Comprehensive Care Consult   Service Spectralink: [phone]  Mon-Fri 9a-4p        Palliative Care Consult   Date Time: 07/07/22 4:12 PM   Patient Name: Shelby Barron, Shelby Barron   Location: [id number]   Attending Physician: Ester Rink, DO   Primary Care Physician: Patsy Lager, MD   Consulting Provider: Hermelinda Dellen, MD   Consulting Service: Palliative Medicine and Comprehensive Care  Consult request from Ester Rink, DO to see patient regarding:   Reason for Referral: Clarify goals of care; Psychosocial or spiritual support     Palliative Diagnosis Category: Cancer, Nephrology, and GI  Patient Type: New     Assessment & Plan  Assessment & Plan   Impression      Shelby Barron is a 82 y.o. female with significant PMH of [name] TTS, CVA (2004), HFpEF, RCC s/p partial b/l nephrectomy, and recently diagnosed urothelial cancer who presented on 6/10 for elective cystectomy and completion nephrectomy with surgery complicated by intraoperative enterotomy resulting in hemorrhagic shock requiring vasopressors with patient abdomen left in discontinuity who is now s/p abdominal anastomosis and abdominal wall closure (6/12), with course further complicated by acute metabolic encephalopathy, new atrial fibrillation with RVR, GNR pneumonia, and dysphagia requiring NGT. Palliative Care consulted in the context of goals of care, advance care planning, and support.     #Acute metabolic encephalopathy  #Dysphagia requiring NGT  #[name] with CRRT during current hospitalization now back on iHD TTS  [facility] s/p bilateral nephrectomy  #Urothelial cancer s/p cystectomy  #New atrial fibrillation with RVR now rate controlled  #Acute hypoxic respiratory failure requiring intubation now on [facility]  #Hemorrhagic/hypovolemic shock requiring vasopressors now off vasopressor support  #Advance care planning/goals of care  #Palliative Care by specialist    Estimated Prognosis: Unclear  Thank you for allowing [place]  to participate in the care of this patient.    Recommendations   1. Goals of Care:      #Capacity: patient without any decisional capacity, she is obtunded and encephalopathic     #Medical decision maker(s): husband [place]. Daughter Ciera Vantine assists with medical decision making. No MPOA paperwork.      #Goals discussed:  - Family meeting held with husband Clancy Lay, daughter Yuvonne Koen, and niece Alona Bene (family physician present via telephone).   - Medical update given. Discussed patient's hospitalization. Discussed chronic conditions including [name], her cardiac history, and her oncological history leading to elective surgeries resulting in complications requiring ICU level of care.   - Discussed concept of quantity of life vs quality of life. Discussed continued disease-directed treatments for increased quantity of life. Discussed unsure whether patient will make it out of the hospital. Discussed restorative pathway for increased quantity of life would include PT/OT/rehab at SNF vs home with home health, along with continued dialysis. Discussed a more comfort-focused approach to care focusing on quality of life, discussed hospice philosophy.   - Discussed setting inpatient limits to care. Discussed CODE STATUS. Recommended for DNR status, recommended for no re-intubation should her pulmonary function worsen. Discussed DNR/DNI status does not change the current workup or level of care. Recommended against long term PEG tube, discussed risks and benefits of artificial hydration and nutrition.   Outcomes:  - Family is clear they would like no limits to care and are OK with CPR and re-intubation in a code event as needed. Discussed one option could be for continued optimization towards comfort/hospice  on discharge whenever medically cleared. Family defers any comfort/hospice approach to care on discharge as they are hopeful patient will be able to regain strength via PT/OT/rehab.  Goals are  for continued full restorative care with goals for SNF/rehab vs home with home health, for follow ups with outpatient specialists, and to continue FULL CODE resuscitation status, to set NO LIMITS to care for now.   - Goals clear, we will sign off for now, please do re-consult as needed, discussed with hospitalist and ICU team.       # Code Status:       - Full Code       -Discussed Code status in detail with the family as above      # Advance Care Planning:       - ACP Validation: Advance care planning discussion initialized       - Medical Decision Maker: husband [place]. Daughter Myami Lugardo assists with medical decision making. No MPOA paperwork.        - ACP Document: None      # Hospice Care:       - Family defers comfort-focused hospice care for now. Goals remain for full restorative care.     2. Symptom Management:      # Pain:        - patient is moaning at times, remains obtunded. She may have acute post-surgical pain. ESRD status noted. Recommend non-opioid renally doses analgesics PRN. Also agree with current oxy IR 5 mg q6h PRN moderate pain. Can add dilaudid 0.2-0.4 mg IV q2h PRN severe pain.        - Prescription Drug Monitoring Program (PDMP) reviewed - no prescribing concerns at this time.        - This medication requires intensive monitoring for drug toxicity and will be monitored during the next 24h.       - OMEs calculated using updated opioid conversion table (McPherson ML. Demystifying Opioid Conversion Calculations, A Guide for Effective Dosing, 2nd Edition. ASHP; 2018.)      # Bowel Habits:       - Bowel regimen prn       # Dyspnea:        - on LFNC 2L O2, FULL CODE per family          #Appetite/nutrition:      - NGT in place. Recommended against long term PEG tube in encephalopathic patients.        # At risk for Delirium:    - Delirium Precautions:           * Frequent reorientation by staff and maintaining appropriate day-night cycles          * Maximize visibility of  environmental cues such as clocks, calendars          * Encourage communication and visits with family members and friends          * Allow exposure to natural light during the day, opening curtains to window          * Encourage day time activities, OOB and keeping pt active during day if possible           * Minimize disturbances and reduce light exposure and TV in the evening/night           * Limit non essential care such as blood draws, imaging, etc          * Group blood draws together to limit frequency  of needle sticks           * Optimize nutrition, bladder/bowel regimen. Constipation, urinary retention, dehydration may worsen delirium. Rule out infection such as UTI or PNA which can predispose patients to delirium.          * Minimize medications that may cause/exacerbate delirium: opiates, anticholinergics, antihistamines, benzodiazepines, and other sedatives      # Debility    # Poor Performance Status:       - Generalized weakness       - PT/OT as tolerated    3. Psychosocial: Family support from husband [place], daughter [name] and niece Alona Bene.     4. Spiritual: patient identifies as Baptist   Spiritual care Chaplain was offered, family declined spiritual care services.    5. Outcomes: Family meetings, Improved pain intervention, Improved non-pain symptom therapy, Clarified goals of care, Counseled regarding hospice, Provided end of life assistance, Provided advance care planning, and Provided psychosocial or spiritual support      PC Team follow-up plans: PC to sign off       Discharge Disposition: TBD      Outpatient Follow Up Recommended: No     Discussed case with: hospitalist and ICU team    90 minutes.  Total time today in care of patient including chart review, evaluation, management, counseling & coordination of care with recommendations above >50% of time on floor.       Hermelinda Dellen, MD  Palliative Medicine & Comprehensive Care  [facility]  Service Spectralink: [phone]  Mon-Fri  9a-4p  Palliative Care Office: [phone]  Mon-Fri 9a-4p     History of Presenting Illness   Medical Records Reviewed: [place] Records via Epic (current and past)    Shelby Barron is a 82 y.o. female with significant PMH of [name] TTS, CVA (2004), HFpEF, RCC s/p partial b/l nephrectomy, and recently diagnosed urothelial cancer who presented on 6/10 for elective cystectomy and completion nephrectomy with surgery complicated by intraoperative enterotomy resulting in hemorrhagic shock requiring vasopressors with patient abdomen left in discontinuity who is now s/p abdominal anastomosis and abdominal wall closure (6/12), with course further complicated by acute metabolic encephalopathy, new atrial fibrillation with RVR, GNR pneumonia, and dysphagia requiring NGT. Palliative Care consulted in the context of goals of care, advance care planning, and support.     Patient seen and examined bedside. She is obtunded, encephalopathic, moaning at times, with NGT in place. Daughter and husband bedside.     Following examination of the patient, a family meeting was held with patient's husband, daughter, and niece. Please see details of family meeting above. Shared family decision to set no limits to care for now, goals remain for full restorative care and to continue FULL CODE resuscitation status.     Patient lives with husband and daughter [name] at home. Patient is [place] [employer], family declined spiritual care services.      Goals of Care   CURRENT CPR Status: Full Code       Advanced Care Planning:      Decisional Capacity: no      Advance Directives have been completed in the past: no      Advance Directives are available in chart: no      Discussed this admission: yes      ACP note completed: no       Date:      Advance Care Planning Discussion:   To continue FULL CODE resuscitation status  To  set no limits to inpatient care   Goals remain for restorative care       Palliative Functional and Symptom Assessment      Palliative Performance Scale: 30% - Totally bed bound, unable to do any work, extensive disease, total care, reduced intake, full LOC, drowsy or confusion       Review of Systems   [x]  Unable to obtain secondary to encephalopathy       Past Medical, Surgical and Family History   Past Medical History:  Past Medical History:   Diagnosis Date    Acid reflux     Asthma, well controlled     Cancer of kidney     Chronic lower back pain     Congestive heart disease     COPD (chronic obstructive pulmonary disease)     CVA (cerebral infarction) 01/12/2003    Diabetes     Diabetic nephropathy     Fibroids     Gout     H/O: gout     Hyperlipidemia     Hypertension     Neuropathy of hand     SOB (shortness of breath)         Past Surgical History:  Past Surgical History:   Procedure Laterality Date    APPENDECTOMY (OPEN)      CLOSURE, ENTEROTOMY, SMALL INTESTINE  06/28/2022    Procedure: CLOSURE, ENTEROTOMY, SMALL INTESTINE;  Surgeon: Marlaine Hind, MD;  [facility]: [facility];  Service: General;;    CYSTECTOMY, ILEOCONDUIT N/A 06/28/2022    Procedure: CYSTECTOMY, RADICAL;  Surgeon: Neldon Newport, MD;  [facility]: [facility];  Service: Urology;  Laterality: N/A;    DRAIN (OTHER) N/A 05/31/2022    Procedure: DRAIN (OTHER);  Surgeon: Hope Pigeon, MD;  Location: [facility];  Service: Interventional Radiology;  Laterality: N/A;    EXPLORATORY LAPAROTOMY N/A 06/30/2022    Procedure: SMALL BOWEL ANASTOMOSIS, CLOSURE OF MESENTERIC DEFECT, CLOSURE OF ABDOMINAL WALL;  Surgeon: Marlaine Hind, MD;  Location: [facility];  Service: General;  Laterality: N/A;  **[id number]/ MD AVAIL [id number] OR AFTER 1600**    EXPLORATORY LAPAROTOMY, RESECTION SMALL BOWEL N/A 06/28/2022    Procedure: RESECTION SMALL BOWEL;  Surgeon: Marlaine Hind, MD;  [facility]: [facility];  Service: General;  Laterality: N/A;    HERNIA REPAIR      HYSTERECTOMY      LAPAROTOMY, NEPHRECTOMY Bilateral 06/28/2022    Procedure: BILATERAL OPEN RADICAL NEPHRECTOMY  AND LYSIS OF ASHESION;  Surgeon: Neldon Newport, MD;  [facility]: [facility];  Service: Urology;  Laterality: Bilateral;    PARTIAL NEPHRECTOMY      tumor removed from kidney  cJanuary 2015    TUNNELED CATH PLACEMENT (PERMCATH) N/A 06/01/2022    Procedure: TUNNELED CATH PLACEMENT;  Surgeon: Suszanne Finch, MD;  Location: [facility];  Service: Interventional Radiology;  Laterality: N/A;    TURBT      WOUND VAC APPLICATION N/A 06/28/2022    Procedure: Tawni Pummel WOUND VAC APPLICATION;  Surgeon: Marlaine Hind, MD;  [facility];  Service: General;  Laterality: N/A;    WOUND VAC APPLICATION N/A 06/30/2022    Procedure: WOUND VAC APPLICATION;  Surgeon: Marlaine Hind, MD;  [facility]: [facility];  Service: General;  Laterality: N/A;        Past Family History:  Family History   Problem Relation Age of Onset    Cancer Mother     Heart failure Father     Cancer  Maternal Aunt     Diabetes Maternal Aunt     Cancer Paternal Aunt         I have reviewed the medical records, current labs, imaging studies, progress notes and consults.    Social History   Substance Use:    reports that she has quit smoking. She has never used smokeless tobacco.    reports no history of alcohol use.    reports no history of drug use.     Marital Status: married    Home/Family: lives at home with husband and daughter [name]    Cultural Concerns:    Nurse, learning disability needed: [x]  NO   []  YES                                       Language: English  ID#_________    Spirituality and Importance: Baptist      Medications   Scheduled Meds  Current Facility-Administered Medications   Medication Dose Route Frequency    acetaminophen  650 mg Oral 4 times per day    albuterol-ipratropium  3 mL Nebulization Q6H SCH    heparin (porcine)  5,000 Units Subcutaneous Q12H SCH    insulin glargine  7 Units Subcutaneous QAM    insulin lispro  2-10 Units Subcutaneous Q4H SCH    meropenem  500 mg Intravenous Q24H    metoprolol tartrate  25 mg Oral Q12H SCH    thiamine  100  mg Intravenous Q24H SCH      Drips     PRN Meds  Current Facility-Administered Medications   Medication Dose    benzocaine  1 spray    dextrose  15 g of glucose    Or    dextrose  12.5 g    Or    dextrose  12.5 g    Or    glucagon (rDNA)  1 mg    diphenhydrAMINE  25 mg    labetalol  10 mg    lidocaine 2% jelly      naloxone  0.2 mg    oxyCODONE  5 mg    phenol  1 spray       Allergies   Allergies   Allergen Reactions    Metrizamide Shortness Of Breath and Nausea Only    Rosuvastatin Other (See Comments)     myopathy    myopathy   myopathy    myopathy myopathy      myopathy    myopathy, myopathy    Iodinated Contrast Media Other (See Comments)     Nausea and SOB per patient   On Dialysisi    Nausea and SOB per patient    Nausea and SOB per patient      Nausea and SOB per patient   Nausea and SOB per patient    Nausea and SOB per patient      Nausea and SOB per patient Nausea and SOB per patient      Nausea and SOB per patient Nausea and SOB per patient Nausea and SOB per patient      Nausea and SOB per patient  Nausea and SOB per patient Nausea and SOB per patient    Nausea and SOB per patient   Nausea and SOB per patient    Pioglitazone      Weight gain    Sulfa Antibiotics Hives     blister  Physical Exam   BP 112/56   Pulse 84   Temp 98.4 F (36.9 C) (Axillary)   Resp (!) 35   Ht 1.575 m (5\' 2" )   Wt 90.4 kg (199 lb 4.7 oz)   SpO2 98%   BMI 36.45 kg/m    Physical Exam:  General: laying supine in bed, appears in mild distress, moaning  HEENT: moist mucus membranes, no scleral icterus, nasal cannula in place, NGT in place  Heart: irregularly irregular rhythm and normal rate per monitor  Lungs: no increased work of breathing   Abd: non-distended  Ext: no edema  Skin: no jaundice  Psych: agitated  Neuro: awake, not alert, oriented x 0      Labs / Radiology   Lab and diagnostics: reviewed in Epic  Recent Labs   Lab 07/07/22  0358   WBC 31.38*   Hemoglobin 7.6*   Hematocrit 22.8*   Platelet Count 358*        Recent Labs   Lab 07/01/22  1120   PT 12.6   INR 1.1   PTT 35        Recent Labs   Lab 07/07/22  0358   Sodium 138   Potassium 3.8   Chloride 104   CO2 21   BUN 68*   Creatinine 4.2*   GFR 9.9*   Glucose 230*   Calcium 8.1     Recent Labs   Lab 07/06/22  0341 07/03/22  0347   Bilirubin, Total  --  0.2   Bilirubin Direct  --  0.1   Protein, Total  --  5.1*   Albumin 1.3* 1.4*  1.4*   ALT  --  16   AST (SGOT)  --  16          XR Chest AP Portable    Result Date: 07/07/2022   There is mild interstitial prominence suggesting mild congestive heart failure/fluid overload in the setting of shortness of breath [name], MD 07/07/2022 12:32 PM    XR Chest AP Portable    Result Date: 07/06/2022   Hypoventilation with basilar atelectasis. Charlott Rakes, MD 07/06/2022 3:35 PM    CT Abdomen Pelvis W IV And PO Cont    Result Date: 07/06/2022  1.Postoperative changes from bilateral nephrectomy and cystectomy. 2.Small amount of ascites including some areas which appear loculated. Small collections of fluid and gas in the anterior midline abdomen may be postoperative but superimposed infection is not excluded. No rim-enhancing fluid collections or extravasation of oral contrast to suggest anastomotic leak. Demetrios Isaacs, MD 07/06/2022 5:51 AM    CT Head WO Contrast    Result Date: 07/06/2022   1.No acute intracranial abnormality is seen. Leandro Reasoner, MD 07/06/2022 3:56 AM    XR Abdomen AP    Result Date: 07/05/2022  Enteric tube terminates in the distal stomach. Elease Etienne, DO 07/05/2022 12:24 PM    XR Chest AP Portable    Result Date: 06/30/2022  1. Lines and tubes in adequate position. No pneumothorax. 2. Trace left pleural effusion and bibasilar atelectasis. Jasmine December D'Heureux, MD 06/30/2022 4:53 PM

## 2022-07-07 NOTE — Progress Notes (Addendum)
[facility]  PROGRESS NOTE      Patient: Shelby Barron  Date: 07/07/2022   LOS: 9 Days  Admission Date: 06/28/2022   MRN: [id number]  Attending: Ester Rink, DO  When on service as the attending, please contact me on Epic Secure Chat from 7 AM - 7 PM for non-urgent issues. For urgent matters use XTend page from 7 AM - 7 PM.       ASSESSMENT/PLAN     Shelby Barron is a 82 y.o. female admitted with Bladder cancer    Interval Summary: [name], [name], type 2 diabetes, renal cell carcinoma status post partial bilateral nephrectomy, [name], new diagnosis of urothelial cancer initally presented on 6/10 for elective cystectomy and completion nephrectomy, [name], [name], extubated and off pressors since 6/13.     She is hypoactive and delirious. Surgery recommended NGT and CTAP today. Plan to start TF today. CTH pending.  Has [name] T/Th/Sat. Nephrology following     Developed new onset afib RVR today during HD. Stopped norvasc switched to metoprolol and gave additional IV metoprolol. Consulted cardiology. Discussed with ICU requested a consult given deterioration. Recommended repeat blood cultures and broadening antibiotics to meropenem. Palliative care consulted.    Rising WBC. Awaiting blood cultures. Patient tachypneic CXR with pulm edema. Discussed with nephrology giving 3 mg IV bumex. Had dark emesis. Vitals stable. TFs held, started IV PPI, follow AM labs.    Patient Active Hospital Problem List:  #S/P Cystectomy and completion nephrectomy complicated by small bowel enterotomy   #Mixed shock - hemorrhagic vs septic: Resolved  - 6/10 small bowel enteretomy with 1L EBL. Went to the OR on June 12 for small bowel anastomosis, closure of mesenteric defect, closure of abdominal wall with wound VAC application  - Failed SLP eval yesterday. Will plan to initiate on tube  feeds per nutrition recs  -Wound VAC changed 6/15  - Surgery consulted, recommend CTAP with PO contrast to eval for intra-abdominal source of infection given uptrending WBC. Surgery ordered pre-medication with steroid/benadryl given h/o contrast allergy  - trend CBC, transfuse Hgb<7. Transfused 3 units PRBC in OR. Received another unit on 6/11 and 6/12  - TTE as part of preop cardiac clearance which showed Mild aortic stenosis (AVA 1.7), grade 1 diastolic dysfunction, normal LV/RV systolic function  - ID consulted, continue IV cefepime and flagyl     #History of CVA  #Encephalopathy/delirium  - CT head pending  - Hold home plavix  - Adhere to day/night cycles and administer constant reorientation.   - CAM ICU monitoring with early initiation of atypical antipsychotics as needed for control of agitated delirium.   - Out of bed to chair with PT/OT consult as indicated.      #Acute hypoxemic respiratory failure  #COPD  Extubated on 6/13, currently on 2L NC   Maintain SaO2 > 92%     #ESRD  HD Tues/Thurs/Sat  Will need eventual permcath exchange this week     #HTN  Started on norvasc  Off nicardipine gtt, PRN IV labetalol for SBP > 180     #Renal cell carcinoma  #Urothelial carcinoma  #Thrombocytopenia  - VTE ppx: SCDs, subcutaneous heparin     #Diabetes Mellitus type 2  -Sliding scale insulin          Nutrition: NPO  Malnutrition Documentation    Moderate Malnutrition related to inadequate nutritional intake in the setting of acute illness as evidenced by <50% EER > 5 days, mild muscle losses (temporalis, deltoid) - new                 Patient has BMI=Body mass index is 36.45 kg/m.  Diagnosis: Obesity based on BMI criteria             Recent Labs   Lab 07/07/22  0358 07/07/22  0044 07/06/22  0341   Hemoglobin 7.6* 7.3* 7.4*   Hematocrit 22.8* 22.2* 22.6*   MCV 93.1 92.9 94.6   WBC 31.38* 30.49* 23.06*   Platelet Count 358* 321 281     Recent Labs   Lab 06/02/22  0514   Iron 62   TIBC 145*   Iron Saturation 43        Code Status: Full Code    Dispo: pending improved mental status    Family Contact: none    DVT Prophylaxis:   Current Facility-Administered Medications (Includes Only Anticoagulants, Misc. Hematological)   Medication Dose Route Last Admin    heparin (porcine) injection 5,000 Units  5,000 Units Subcutaneous 5,000 Units at 07/07/22 0930          CHART  REVIEW & DISCUSSION     The following chart items were reviewed as of 6:18 PM on 07/07/22:  []  Lab Results []  Imaging Results   []  Problem List  []  Current Orders []  Current Medications  []  Allergies  []  Code Status []  Previous Notes   []  SDoH    The management and plan of care for this patient was discussed with the following specialty consultants:  []  Cardiology  []  Gastroenterology                 []  Infectious Disease  []  Pulmonology []  Neurology                [x]  Nephrology  []  Neurosurgery []  Orthopedic Surgery  []  Heme/Onc  []  General Surgery []  Psychiatry                                   []  Palliative    SUBJECTIVE     Shelby Barron moaning, not responding appropriately   Family updated bedside    MEDICATIONS     Current Facility-Administered Medications   Medication Dose Route Frequency    acetaminophen  650 mg Oral 4 times per day    albuterol-ipratropium  3 mL Nebulization Q6H SCH    bumetanide  3 mg Intravenous Once    heparin (porcine)  5,000 Units Subcutaneous Q12H [facility]    insulin glargine  7 Units Subcutaneous QAM    insulin lispro  2-10 Units Subcutaneous Q4H SCH    meropenem  500 mg Intravenous Q24H    metoprolol tartrate  25 mg Oral Q12H SCH    pantoprazole  40 mg Intravenous Daily    thiamine  100 mg Intravenous Q24H SCH       PHYSICAL EXAM     Vitals:    07/07/22 1646   BP:    Pulse: 96   Resp: (!) 39   Temp:    SpO2: 99%       Temperature: Temp  Min: 97.6 F (36.4 C)  Max: 98.6 F (37 C)  Pulse: Pulse  Min: 84  Max: 109  Respiratory: Resp  Min: 20  Max: 40  Non-Invasive BP: BP  Min: 100/50  Max: 155/67  Pulse Oximetry SpO2  Min: 94 %   Max: 99 %    Intake and Output Summary (Last 24 hours) at Date Time    Intake/Output Summary (Last 24 hours) at 07/07/2022 1818  Last data filed at 07/07/2022 0800  Gross per 24 hour   Intake 165 ml   Output 0 ml   Net 165 ml         GEN APPEARANCE: Normal;  A&OX0  HEENT: PERLA; EOMI; Conjunctiva Clear  NECK: Supple; No bruits  CVS: RRR, S1, S2; No M/G/R  LUNGS: CTAB; No Wheezes; No Rhonchi: No rales  ABD: Soft; No TTP; + Normoactive BS  EXT: No edema; Pulses 2+ and intact  NEURO: moving UEs spontaneously, moaning intermittently unable to cooperate fully in MS exam  MENTAL STATUS:     LABS     Recent Labs   Lab 07/07/22  0358 07/07/22  0044 07/06/22  0341   WBC 31.38* 30.49* 23.06*   RBC 2.45* 2.39* 2.39*   Hemoglobin 7.6* 7.3* 7.4*   Hematocrit 22.8* 22.2* 22.6*   MCV 93.1 92.9 94.6   Platelet Count 358* 321 281       Recent Labs   Lab 07/07/22  0358 07/06/22  1545 07/06/22  0341 07/05/22  0237 07/04/22  0432 07/03/22  1412   Sodium 138 139 139 143 141 137   Potassium 3.8 3.8 4.5 4.4 3.9 3.5   Chloride 104 103 106 106 104 103   CO2 21 23 21 23 24 25    BUN 68* 49* 79* 50* 29* 15   Creatinine 4.2* 3.4* 5.3* 4.0* 2.6* 1.4*   Glucose 230* 200* 210* 200* 206* 269*   Calcium 8.1 7.8* 7.7* 7.6* 7.9 8.3   Magnesium 2.1  --  2.3 2.2 2.1 1.9       Recent Labs   Lab 07/06/22  0341 07/03/22  0347 07/02/22  1749 07/02/22  1122 07/02/22  0502 07/01/22  1120 07/01/22  0506   ALT  --  16  --   --  18  --  18   AST (SGOT)  --  16  --   --  23  --  25   Bilirubin, Total  --  0.2  --   --  0.3  --  0.4   Bilirubin Direct  --  0.1  --   --  0.2  --  0.2   Albumin 1.3* 1.4*  1.4* 1.4*  More results in Results Review 1.5*  1.4*  More results in Results Review 1.4*  1.4*   Alkaline Phosphatase  --  61  --   --  64  --  68   More results in Results Review = values in this interval not displayed.             Recent Labs   Lab 07/01/22  1120 07/01/22  0506 06/30/22  2310   INR 1.1 1.0 1.1   PT 12.6 12.1 12.3   PTT 35 38 27        Microbiology Results (last 15 days)       Procedure Component Value Units Date/Time    Culture, Blood, Aerobic And Anaerobic [[id number]] Collected: 07/07/22 0355    Order Status: Resulted Specimen: Blood, Venous Updated: 07/07/22 0428    Culture, Blood, Aerobic And Anaerobic [[id number]] Collected: 07/06/22 1837    Order Status: Resulted Specimen:  Blood, Venous Updated: 07/06/22 1921    Culture, Sputum and Lower Respiratory [[id number]]     Order Status: Sent Specimen: Sputum, Induced     Culture, Sputum and Lower Respiratory [[id number]]  (Abnormal) Collected: 07/04/22 1034    Order Status: Completed Specimen: Sputum, Suctioned Updated: 07/07/22 1529     Culture Respiratory Heavy growth of Mixed upper respiratory flora      Light growth of Gram negative rod     Comment: Questionable significance due to low quantity.  No further work.        Gram Stain Few WBCs      Rare Squamous epithelial cells      Few Mixed respiratory flora    Rectal Swab Carbapenemase Surveillance, PCR [[id number]]  (Normal) Collected: 06/29/22 1842    Order Status: Completed Specimen: Swab from Rectum Updated: 06/30/22 0150     IMP gene sequence Not Detected     VIM gene sequence Not Detected     NDM gene sequence Not Detected     KPC gene sequence Not Detected     OXA-48 gene sequence Not Detected    Narrative:      This test utilizes real-time PCR for the detection and differentiation of gene sequences associated with carbapenem-non-susceptibility in Enterobacterales (Enterobacteriaceae), Pseudomonas aeruginosa and Acinetobacter baumannii. Targets include blaKPC, blaNDM, blaVIM, blaOXA-48 (and blaOXA-181), and blaIMP (excluding IMP-7, IMP-13, IMP-14). Detection of these gene sequences does not indicate the presence of viable organisms. This assay does not report variants of these gene sequences. Mutations or polymorphisms may affect detection of current, new, or unknown variants, resulting in a false negative result. This test is for  infection prevention surveillance purposes only and should not be used to make treatment decisions. The performance of the Xpert Carba-R Assay has not been evaluated with rectal swab specimens from pediatric patients.             RADIOLOGY     XR Chest AP Portable    Result Date: 07/07/2022   There is mild interstitial prominence suggesting mild congestive heart failure/fluid overload in the setting of shortness of breath [name], MD 07/07/2022 12:32 PM    XR Chest AP Portable    Result Date: 07/06/2022   Hypoventilation with basilar atelectasis. Charlott Rakes, MD 07/06/2022 3:35 PM    CT Abdomen Pelvis W IV And PO Cont    Result Date: 07/06/2022  1.Postoperative changes from bilateral nephrectomy and cystectomy. 2.Small amount of ascites including some areas which appear loculated. Small collections of fluid and gas in the anterior midline abdomen may be postoperative but superimposed infection is not excluded. No rim-enhancing fluid collections or extravasation of oral contrast to suggest anastomotic leak. Demetrios Isaacs, MD 07/06/2022 5:51 AM    CT Head WO Contrast    Result Date: 07/06/2022   1.No acute intracranial abnormality is seen. Leandro Reasoner, MD 07/06/2022 3:56 AM    XR Abdomen AP    Result Date: 07/05/2022  Enteric tube terminates in the distal stomach. Elease Etienne, DO 07/05/2022 12:24 PM    XR Chest AP Portable    Result Date: 06/30/2022  1. Lines and tubes in adequate position. No pneumothorax. 2. Trace left pleural effusion and bibasilar atelectasis. Jasmine December D'Heureux, MD 06/30/2022 4:53 PM    XR Chest AP Portable    Result Date: 06/30/2022  Catheters as described. Pankaj Dominica, MD 06/30/2022 2:07 PM    XR Chest AP Portable    Result Date: 06/28/2022   Right mainstem bronchial  intubation. The tube should be withdrawn about 4 cm. Dr. Tildon Husky is aware. Wynema Birch, MD 06/28/2022 4:56 PM    XR OR MISCOUNT    Result Date: 06/28/2022   No unexpected foreign body. These critical results were discussed with and  acknowledged by Angie R., the circulating nurse, who will communicate the findings to Everardo All, MD on 06/28/2022 3:02 PM. Bosie Helper, MD 06/28/2022 3:03 PM   Echo Results       None          No results found for this or any previous visit.    Signed,  Ester Rink, DO  6:18 PM 07/07/2022

## 2022-07-07 NOTE — Treatment Plan (Cosign Needed)
4 eyes in 4 hours pressure injury assessment note:      Completed with:   Unit & Time admitted:              Bony Prominences: Check appropriate box; if wound is present enter wound assessment in LDA     Occiput:                 [x]WNL  [] Wound present  Face:                     [x]WNL  [] Wound present  Ears:                      [x]WNL  [] Wound present  Spine:                    [x]WNL  [] Wound present  Shoulders:             [x]WNL  [] Wound present  Elbows:                  [x]WNL  [] Wound present  Sacrum/coccyx:     [x]WNL  [] Wound present  Ischial Tuberosity:  [x]WNL  [] Wound present  Trochanter/Hip:      [x]WNL  [] Wound present  Knees:                   [x]WNL  [] Wound present  Ankles:                   [x]WNL  [] Wound present  Heels:                    [x]WNL  [] Wound present  Other pressure areas:  [] Wound location       Device related: [] Device name:         LDA completed if wound present: yes/no  Consult WOCN if necessary    Other skin related issues, ie tears, rash, etc, document in Integumentary flowsheet

## 2022-07-07 NOTE — Progress Notes (Signed)
GENERAL SURGERY  PROGRESS NOTE    Date Time: 07/07/22 4:35 PM  Patient Name: Shelby Barron Day: 9    ASSESSMENT:     Patient Active Problem List   Diagnosis    Type 2 diabetes mellitus with kidney complication, with long-term current use of insulin    Diabetes mellitus type II, uncontrolled    Neuropathy of hand    Left renal mass    Hamstring injury    Peritonitis    ESRD (end stage renal disease)    Chronic diastolic congestive heart failure    Aortic atherosclerosis    Malignant neoplasm of urinary bladder, unspecified site    Malignant tumor of kidney, left    Benign essential hypertension    GERD (gastroesophageal reflux disease)    Leukocytosis    Bladder cancer    Anemia    COPD (chronic obstructive pulmonary disease)       Shelby Barron is a 82 y.o. female with bladder cancer and renal failure admitted for cystectomy with bilateral nephrectomy complicated by enterotomy requiring small bowel resection in discontinuity and negative pressure temporary abdominal closure given pressure support and need for resuscitation. Returned to the OR 6/12 for re-anastomosis and abdominal closure.  SLP with recommendation for n.p.o.  Concern for vaginal discharge, discussed with urology, this is anticipated given partial vaginectomy.  Patient had about bowel movement yesterday.  Patient currently has a pneumonia.     PLAN:   - CT shows no significant intraabdominal collections or anastomotic leak  - Multimodal pain control   - Wound care ostomy nursing consultation  - Nutrition consulted, on tube feedings- continue as tolerated  - Transfuse PRN for anemia  - Wound VAC  - Abx as per ID  - Remainder of care per primary team  - No additional surgery Recs. Will go on standby. Reconsult PRN.    SUBJECTIVE:   No acute overnight events   Remains encephalopathic despite negative head CT   Family concerned her pain is not well controlled      Objective:   Current Vitals:   Vitals:    07/07/22 1621   BP:    Pulse:     Resp: (!) 35   Temp:    SpO2:        Intake and Output Summary (Last 24 hours):  I/O last 3 completed shifts:  In: 1525 [P.O.:1000; I.V.:40; NG/GT:385; IV Piggyback:100]  Out: 759 [Other:759]    Labs:     Results       Procedure Component Value Units Date/Time    Whole Blood Glucose POCT [[id number]]  (Abnormal) Collected: 07/07/22 1542    Specimen: Blood, Capillary Updated: 07/07/22 1548     Whole Blood Glucose POCT 286 mg/dL     Culture, Sputum and Lower Respiratory [[id number]]  (Abnormal) Collected: 07/04/22 1034    Specimen: Sputum, Suctioned Updated: 07/07/22 1529     Culture Respiratory Heavy growth of Mixed upper respiratory flora      Light growth of Gram negative rod     Gram Stain Few WBCs      Rare Squamous epithelial cells      Few Mixed respiratory flora    Whole Blood Glucose POCT [[id number]]  (Abnormal) Collected: 07/07/22 1220    Specimen: Blood, Capillary Updated: 07/07/22 1222     Whole Blood Glucose POCT 248 mg/dL     Whole Blood Glucose POCT [[id number]]  (Abnormal) Collected: 07/07/22 0734    Specimen: Blood,  Capillary Updated: 07/07/22 0738     Whole Blood Glucose POCT 232 mg/dL     Magnesium [[id number]]  (Normal) Collected: 07/07/22 0358    Specimen: Blood, Venous Updated: 07/07/22 0431     Magnesium 2.1 mg/dL     Basic Metabolic Panel [[id number]]  (Abnormal) Collected: 07/07/22 0358    Specimen: Blood, Venous Updated: 07/07/22 0431     Glucose 230 mg/dL      BUN 68 mg/dL      Creatinine 4.2 mg/dL      Calcium 8.1 mg/dL      Sodium 308 mEq/L      Potassium 3.8 mEq/L      Chloride 104 mEq/L      CO2 21 mEq/L      Anion Gap 13.0     GFR 9.9 mL/min/1.73 m2     Culture, Blood, Aerobic And Anaerobic [[id number]] Collected: 07/07/22 0355    Specimen: Blood, Venous Updated: 07/07/22 0428    CBC without Differential [[id number]]  (Abnormal) Collected: 07/07/22 0358    Specimen: Blood, Venous Updated: 07/07/22 0415     WBC 31.38 x10 3/uL      Hemoglobin 7.6 g/dL      Hematocrit 40.1 %      Platelet Count  358 x10 3/uL      MPV 10.6 fL      RBC 2.45 x10 6/uL      MCV 93.1 fL      MCH 31.0 pg      MCHC 33.3 g/dL      RDW 16 %      nRBC % 0.2 /100 WBC      Absolute nRBC 0.06 x10 3/uL     Whole Blood Glucose POCT [[id number]]  (Abnormal) Collected: 07/07/22 0336    Specimen: Blood, Capillary Updated: 07/07/22 0344     Whole Blood Glucose POCT 227 mg/dL     CBC without Differential [[id number]]  (Abnormal) Collected: 07/07/22 0044    Specimen: Blood, Venous Updated: 07/07/22 0100     WBC 30.49 x10 3/uL      Hemoglobin 7.3 g/dL      Hematocrit 56.3 %      Platelet Count 321 x10 3/uL      MPV 10.5 fL      RBC 2.39 x10 6/uL      MCV 92.9 fL      MCH 30.5 pg      MCHC 32.9 g/dL      RDW 16 %      nRBC % 0.3 /100 WBC      Absolute nRBC 0.09 x10 3/uL     Whole Blood Glucose POCT [[id number]]  (Abnormal) Collected: 07/06/22 2340    Specimen: Blood, Capillary Updated: 07/07/22 0008     Whole Blood Glucose POCT 220 mg/dL     Whole Blood Glucose POCT [[id number]]  (Abnormal) Collected: 07/06/22 2001    Specimen: Blood, Capillary Updated: 07/06/22 2008     Whole Blood Glucose POCT 179 mg/dL     Culture, Blood, Aerobic And Anaerobic [[id number]] Collected: 07/06/22 1837    Specimen: Blood, Venous Updated: 07/06/22 1921            Rads:     Radiology Results (24 Hour)       Procedure Component Value Units Date/Time    XR Chest AP Portable [[id number]] Collected: 07/07/22 1231    Order Status: Completed Updated: 07/07/22 1234    Narrative:      History: sob.  Technique: :XR CHEST AP PORTABLE     Comparison: June 1824    Findings:  There is mild interstitial prominence  There is no pneumothorax.  Dialysis catheter and central venous catheter tips in the right atrium.  Feeding tube extends into the stomach but below bottom of    The  mediastinum is within normal limits.          Impression:       There is mild interstitial prominence suggesting mild  congestive heart failure/fluid overload in the setting of shortness of  breath    Shelby Slimmer, MD  07/07/2022 12:32 PM              Medications:     Current Facility-Administered Medications   Medication Dose Route Frequency    acetaminophen  650 mg Oral 4 times per day    albuterol-ipratropium  3 mL Nebulization Q6H SCH    heparin (porcine)  5,000 Units Subcutaneous Q12H [facility]    insulin glargine  7 Units Subcutaneous QAM    insulin lispro  2-10 Units Subcutaneous Q4H [facility]    meropenem  500 mg Intravenous Q24H    metoprolol tartrate  25 mg Oral Q12H [facility]    thiamine  100 mg Intravenous Q24H [facility]         benzocaine, dextrose **OR** dextrose **OR** dextrose **OR** glucagon (rDNA), diphenhydrAMINE, labetalol, lidocaine 2% jelly, naloxone, oxyCODONE, phenol     Physical Exam:     Physical Exam  Vitals reviewed.   Constitutional:       General: She is not in acute distress.     Appearance: She is ill-appearing. She is not toxic-appearing or diaphoretic.   Eyes:      General: No scleral icterus.  Cardiovascular:      Rate and Rhythm: Regular rhythm. Tachycardia present.      Pulses: Normal pulses.   Pulmonary:      Breath sounds: Wheezing present.      Comments: Increased respiratory effort   On 2L NC   Abdominal:      General: There is no distension.      Palpations: Abdomen is soft.      Tenderness: There is no abdominal tenderness.      Comments: Wound vac in place over midline holding suction, serosanguinous output in canister   Skin:     General: Skin is warm.      Coloration: Skin is not jaundiced.      Findings: No rash.   Neurological:      Mental Status: She is disoriented.

## 2022-07-08 DIAGNOSIS — R000 Tachycardia, unspecified: Secondary | ICD-10-CM

## 2022-07-08 DIAGNOSIS — I4891 Unspecified atrial fibrillation: Secondary | ICD-10-CM

## 2022-07-08 DIAGNOSIS — K920 Hematemesis: Secondary | ICD-10-CM

## 2022-07-08 LAB — WHOLE BLOOD GLUCOSE POCT
Whole Blood Glucose POCT: 157 mg/dL — ABNORMAL HIGH (ref 70–100)
Whole Blood Glucose POCT: 172 mg/dL — ABNORMAL HIGH (ref 70–100)
Whole Blood Glucose POCT: 162 mg/dL — ABNORMAL HIGH (ref 70–100)
Whole Blood Glucose POCT: 143 mg/dL — ABNORMAL HIGH (ref 70–100)
Whole Blood Glucose POCT: 159 mg/dL — ABNORMAL HIGH (ref 70–100)
Whole Blood Glucose POCT: 196 mg/dL — ABNORMAL HIGH (ref 70–100)

## 2022-07-08 LAB — BASIC METABOLIC PANEL
Anion Gap: 14 (ref 5.0–15.0)
BUN: 99 mg/dL — ABNORMAL HIGH (ref 7–21)
CO2: 19 mEq/L (ref 17–29)
Calcium: 8.4 mg/dL (ref 7.9–10.2)
Chloride: 107 mEq/L (ref 99–111)
Creatinine: 5.6 mg/dL — ABNORMAL HIGH (ref 0.4–1.0)
GFR: 7 mL/min/{1.73_m2} — ABNORMAL LOW (ref >=60.0)
Glucose: 179 mg/dL — ABNORMAL HIGH (ref 70–100)
Potassium: 4.1 mEq/L (ref 3.5–5.3)
Sodium: 140 mEq/L (ref 135–145)

## 2022-07-08 LAB — CBC
Absolute nRBC: 0.14 10*3/uL — ABNORMAL HIGH (ref <=0.00)
Hematocrit: 21.6 % — ABNORMAL LOW (ref 34.7–43.7)
Hemoglobin: 7.1 g/dL — ABNORMAL LOW (ref 11.4–14.8)
MCH: 31.1 pg (ref 25.1–33.5)
MCHC: 32.9 g/dL (ref 31.5–35.8)
MCV: 94.7 fL (ref 78.0–96.0)
MPV: 11.2 fL (ref 8.9–12.5)
Platelet Count: 298 10*3/uL (ref 142–346)
RBC: 2.28 10*6/uL — ABNORMAL LOW (ref 3.90–5.10)
RDW: 15 % (ref 11–15)
WBC: 34.08 10*3/uL — ABNORMAL HIGH (ref 3.10–9.50)
nRBC %: 0.4 /100 WBC — ABNORMAL HIGH (ref <=0.0)

## 2022-07-08 LAB — MAGNESIUM: Magnesium: 2.2 mg/dL (ref 1.6–2.6)

## 2022-07-08 LAB — LACTIC ACID
Whole Blood Lactic Acid: 0.9 mmol/L (ref 0.2–2.0)
Whole Blood Lactic Acid: 0.9 mmol/L (ref 0.2–2.0)

## 2022-07-08 LAB — PHOSPHORUS: Phosphorus: 6.2 mg/dL — ABNORMAL HIGH (ref 2.3–4.7)

## 2022-07-08 LAB — TYPE AND SCREEN
ABO Rh: O POS
Antibody Screen: NEGATIVE

## 2022-07-08 MED ORDER — METOPROLOL TARTRATE 5 MG/5ML IV SOLN
INTRAVENOUS | Status: AC
Start: 2022-07-08 — End: 2022-07-09
  Filled 2022-07-08: qty 5

## 2022-07-08 MED ORDER — AMIODARONE HCL IN DEXTROSE 360-4.14 MG/200ML-% IV SOLN
0.5000 mg/min | INTRAVENOUS | Status: DC
Start: 2022-07-08 — End: 2022-07-16
  Administered 2022-07-08 – 2022-07-16 (×17): 0.5 mg/min via INTRAVENOUS
  Filled 2022-07-08 (×16): qty 200

## 2022-07-08 MED ORDER — SODIUM CHLORIDE 0.9 % IV BOLUS
250.0000 mL | INTRAVENOUS | Status: AC | PRN
Start: 2022-07-08 — End: 2022-07-08

## 2022-07-08 MED ORDER — SODIUM CHLORIDE 0.9 % IV BOLUS
100.0000 mL | INTRAVENOUS | Status: AC | PRN
Start: 2022-07-08 — End: 2022-07-08

## 2022-07-08 MED ORDER — ALBUMIN HUMAN 25 % IV SOLN
100.0000 mL | INTRAVENOUS | Status: AC | PRN
Start: 2022-07-08 — End: 2022-07-08

## 2022-07-08 MED ORDER — METOPROLOL TARTRATE 25 MG PO TABS
25.0000 mg | ORAL_TABLET | Freq: Two times a day (BID) | ORAL | Status: DC
Start: 2022-07-08 — End: 2022-07-08

## 2022-07-08 MED ORDER — METOPROLOL TARTRATE 25 MG PO TABS
25.0000 mg | ORAL_TABLET | Freq: Once | ORAL | Status: DC
Start: 2022-07-08 — End: 2022-07-08

## 2022-07-08 MED ORDER — METOPROLOL TARTRATE 5 MG/5ML IV SOLN
5.0000 mg | Freq: Once | INTRAVENOUS | Status: AC
Start: 2022-07-08 — End: 2022-07-08
  Administered 2022-07-08: 5 mg via INTRAVENOUS

## 2022-07-08 MED ORDER — METOPROLOL TARTRATE 12.5 MG PO SPLIT TAB
12.5000 mg | ORAL_TABLET | Freq: Two times a day (BID) | ORAL | Status: DC
Start: 2022-07-08 — End: 2022-07-08

## 2022-07-08 MED ORDER — AMIODARONE HCL IN DEXTROSE 150-4.21 MG/100ML-% IV SOLN
150.0000 mg | Freq: Once | INTRAVENOUS | Status: AC
Start: 2022-07-08 — End: 2022-07-08
  Administered 2022-07-08: 150 mg via INTRAVENOUS
  Filled 2022-07-08: qty 100

## 2022-07-08 MED ORDER — AMIODARONE HCL IN DEXTROSE 360-4.14 MG/200ML-% IV SOLN
1.0000 mg/min | INTRAVENOUS | Status: AC
Start: 2022-07-08 — End: 2022-07-08
  Administered 2022-07-08: 1 mg/min via INTRAVENOUS
  Filled 2022-07-08 (×2): qty 200

## 2022-07-08 MED ORDER — SODIUM CHLORIDE 0.9 % IV SOLN
100.0000 mg | INTRAVENOUS | Status: AC
Start: 2022-07-08 — End: 2022-07-20
  Administered 2022-07-08 – 2022-07-20 (×13): 100 mg via INTRAVENOUS
  Filled 2022-07-08 (×15): qty 5

## 2022-07-08 MED ORDER — AMIODARONE HCL IN DEXTROSE 150-4.21 MG/100ML-% IV SOLN
150.0000 mg | Freq: Once | INTRAVENOUS | Status: DC
Start: 2022-07-08 — End: 2022-07-08

## 2022-07-08 MED ORDER — PANTOPRAZOLE SODIUM 40 MG IV SOLR
40.0000 mg | Freq: Two times a day (BID) | INTRAVENOUS | Status: DC
Start: 2022-07-08 — End: 2022-07-22
  Administered 2022-07-08 – 2022-07-22 (×28): 40 mg via INTRAVENOUS
  Filled 2022-07-08 (×28): qty 40

## 2022-07-08 NOTE — Progress Notes (Signed)
Per Nephrologist, Dr. Mirian Capuchin.  MD ordered to hold HD tx for now.  ICU primary RN and daughter made aware of it.

## 2022-07-08 NOTE — RRT Follow Up Note (Signed)
RRT Attending Note    Late entry due to patient care.  Called to respond to RRT due to atrial fibrillation with RVR which developed during initiation of dialysis.  Per RN at bedside, patient was briefly hypotensive overnight, improved with albumin infusion, after which her metoprolol was held.    Rates in 130s to 140s during my evaluation, with SBP in the 130s.  EKG showed atrial fibrillation with RVR.  Improved to rates in the 110s after metoprolol IV push 5 mg x 1.  Resume metoprolol 25 mg via NG tube.    Patient's primary attending arrived to discuss further with family and care team.    Mickey Farber, MD

## 2022-07-08 NOTE — Nursing Progress Note (Addendum)
Messaged MD to see if he wants to draw another lactic, sepsis screen keeps popping up

## 2022-07-08 NOTE — Progress Notes (Signed)
Nutritional Support Services  Nutrition Follow-up    Shelby Barron 82 y.o. female   MRN: [id number]    Summary of Nutrition Recommendations:    1. Once clinically approved, resume tube feeding of Glucerna 1.5 @ 51ml/hr x 24hrs/day              Provides 1620 kcals, 89g protein, free H2O     2. Flush Prosource 1 pkt daily               Each packet provides 80 kcals, 20 g protein       3. Flush tube with minimum of 100 ml water Q 4 hours for tube patency- additional fluids for hydration per MD      Total EN regimen provides 1700 kcals, 109g protein, H2O  Meets 100% kcal/protein needs        4. Monitor pt's ability to take PO    -----------------------------------------------------------------------------------------------------------------                                                           ASSESSMENT DATA     Subjective Nutrition: Patient seen at bedside, sleeping. Observed TF off at time of visit. Per notes TF held yesterday for increased SOB. Per RN, unclear if TF will resume today. Pt to be seen by GI, will monitor recs.      Medical Hx:  has a past medical history of Acid reflux, Asthma, well controlled, Cancer of kidney, Chronic lower back pain, Congestive heart disease, COPD (chronic obstructive pulmonary disease), CVA (cerebral infarction) (01/12/2003), Diabetes, Diabetic nephropathy, Fibroids, Gout, H/O: gout, Hyperlipidemia, Hypertension, Neuropathy of hand, and SOB (shortness of breath).       Orders Placed This Encounter   Procedures    Diet NPO effective now    Tube feeding-Continuous       Enteral: Glucerna 1.5 @ 63ml/hr x 24hrs/day, Q4 H2O flush + Prosource daily  Total EN regimen provides 1700 kcals, 109g protein, H2O  Meets 100% kcal/protein needs.    Per pump history, pt received:  251 ml x 24 hours (23% of goal volume)  1049 ml x 48 hours (74% of goal volume)  1508 ml x 72 hours (43% of goal volume)  Patient is meeting an average of ~50% of kcal/protein needs with  prosource. Noted TF held overnight which accounts for decreased provision x 24 hrs. And noted TF has been advancing to goal within 24-72 hrs. Will continue to monitor TF at goal rate once resumed.      ANTHROPOMETRIC  Height: 157.5 cm (5\' 2" )  Weight: 93.2 kg (205 lb 7.5 oz)  Weight Change: 3.1  Body mass index is 37.58 kg/m.       ESTIMATED NEEDS    Total Daily Energy Needs: 1566 to 2001 kcal  Method for Calculating Energy Needs: 18 kcal - 23 kcal per kg  at 87 kg (Actual body weight)  Rationale: obese, noncritical, HD       Total Daily Protein Needs: 104.4 to 130.5 g  Method for Calculating Protein Needs: 2 g - 2.5 g per kg at 52.2 kg (Ideal body weight)  Rationale: obese, noncritical, HD      Total Daily Fluid Needs: 1044 to 1305 ml  Method for Calculating Fluid Needs: 20 ml -  25 ml  per kg at 52.2 kg (Ideal body weight)  Rationale: bmi, age    Pertinent Medications: bumex, protonix, lantus, merrem, thiamine, SSI high dose       Pertinent labs:  Recent Labs   Lab 07/08/22  0610 07/07/22  2120 07/07/22  0358 07/06/22  1545 07/06/22  0341 07/05/22  0237 07/04/22  0432   Sodium 140 134* 138 139 139 143 141   Potassium 4.1 4.0 3.8 3.8 4.5 4.4 3.9   Chloride 107 103 104 103 106 106 104   CO2 19 18 21 23 21 23 24    BUN 99* 94* 68* 49* 79* 50* 29*   Creatinine 5.6* 5.3* 4.2* 3.4* 5.3* 4.0* 2.6*   Glucose 179* 277* 230* 200* 210* 200* 206*   Calcium 8.4 8.2 8.1 7.8* 7.7* 7.6* 7.9   Magnesium 2.2 2.3 2.1  --  2.3 2.2 2.1   Phosphorus 6.2* 6.5*  --   --  4.2 3.4 2.0*   GFR 7.0* 7.5* 9.9* 12.7* 7.5* 10.5* 17.5*                                                        MONITORING/EVALUATION     Goals:    1. Patient will meet >80% of nutritional needs via tube feeding by next RD follow up  - active    Nutrition Risk Level: High (will follow up at least 2 times per week and PRN)        Baxter Hire, RD, CNSC  Clinical Dietitian  [phone]

## 2022-07-08 NOTE — Progress Notes (Shared)
[facility] PROGRESS NOTE      Date of Note: 07/08/2022   Patient Name: Shelby Barron     Date of Birth:  [birth year]     MRN:               [id number]     PCP:               Patsy Lager, MD     I am available on epic chart and [employer] link extension [phone] Monday - Friday 8:30-16:30. If urology consultation is needed outside these hours please contact [facility] at [phone].      ASSESSMENT/  PLAN     -Continue supportive care, appreciate ICU and general surgery input  -no additional urology recs    I will discuss/discussed the plan of care with the following services:  Dr. Elijio Miles, Urology     I spent a total of 10 minutes involved in this patient's care    Sheldon Silvan, PA  07/08/2022, 3:27 PM    SUBJECTIVE     Interval History: 6/10 radical cystectomy and bilateral nephrectomy with enterotomy  6/12 abdominal wound closure, bowel anastamosis    elevated WBC and tachy. CT no indicative of abscess or leak.       Patient Active Problem List    Diagnosis Date Noted    Anemia 06/30/2022    COPD (chronic obstructive pulmonary disease) 06/30/2022    Bladder cancer 06/28/2022    Malignant neoplasm of urinary bladder, unspecified site 06/25/2022    Malignant tumor of kidney, left 06/25/2022    GERD (gastroesophageal reflux disease) 06/25/2022    Leukocytosis 06/25/2022    Chronic diastolic congestive heart failure 06/23/2022    Aortic atherosclerosis 06/23/2022    Peritonitis 05/26/2022    ESRD (end stage renal disease) 05/26/2022    Hamstring injury 05/10/2013    Benign essential hypertension 02/10/2013    Left renal mass 11/28/2012    Type 2 diabetes mellitus with kidney complication, with long-term current use of insulin 07/31/2010    Diabetes mellitus type II, uncontrolled 07/31/2010    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        OBJECTIVE     Vital Signs: BP 132/66   Pulse (!) 145   Temp 98.1 F (36.7 C)   Resp (!) 27   Ht 1.575 m (5\' 2" )   Wt 93.2 kg (205 lb 7.5 oz)   SpO2 97%   BMI 37.58 kg/m      TMax: Temp (24hrs), Avg:98.2 F (36.8 C), Min:96.5 F (35.8 C), Max:98.8 F (37.1 C)    I/Os:   Intake/Output Summary (Last 24 hours) at 07/08/2022 1527  Last data filed at 07/08/2022 1200  Gross per 24 hour   Intake 1190 ml   Output 650 ml   Net 540 ml             LABS & IMAGING     CBC  Recent Labs     07/08/22  0610 07/07/22  2120 07/07/22  0358   WBC 34.08* 31.44* 31.38*   RBC 2.28* 2.20* 2.45*   Hematocrit 21.6* 21.1* 22.8*   MCV 94.7 95.9 93.1   MCH 31.1 30.0 31.0   MCHC 32.9 31.3* 33.3   RDW 15 16* 16*   MPV 11.2 10.8 10.6       BMP  Recent Labs     07/08/22  0610 07/07/22  2120 07/07/22  0358   CO2 19 18 21  Anion Gap 14.0 13.0 13.0   BUN 99* 94* 68*   Creatinine 5.6* 5.3* 4.2*       INR  Lab Results   Component Value Date/Time    INR 1.1 07/01/2022 11:20 AM    INR 1.1 06/01/2022 03:15 AM         IMAGING:  XR Chest AP Portable   Final Result    There is mild interstitial prominence suggesting mild   congestive heart failure/fluid overload in the setting of shortness of   breath      [name], MD   07/07/2022 12:32 PM      XR Chest AP Portable   Final Result       Hypoventilation with basilar atelectasis.      Charlott Rakes, MD   07/06/2022 3:35 PM      CT Abdomen Pelvis W IV And PO Cont   Final Result      1.Postoperative changes from bilateral nephrectomy and cystectomy.    2.Small amount of ascites including some areas which appear loculated.   Small collections of fluid and gas in the anterior midline abdomen may be   postoperative but superimposed infection is not excluded. No rim-enhancing   fluid collections or extravasation of oral contrast to suggest anastomotic   leak.      Demetrios Isaacs, MD   07/06/2022 5:51 AM      CT Head WO Contrast   Final Result       1.No acute intracranial abnormality is seen.      Leandro Reasoner, MD   07/06/2022 3:56 AM      XR Abdomen AP   Final Result      Enteric tube terminates in the distal stomach.      Elease Etienne, DO   07/05/2022 12:24 PM      XR Chest AP  Portable   Final Result         1. Lines and tubes in adequate position. No pneumothorax.   2. Trace left pleural effusion and bibasilar atelectasis.      Jasmine December D'Heureux, MD   06/30/2022 4:53 PM      XR Chest AP Portable   Final Result      Catheters as described.      Pankaj Dominica, MD   06/30/2022 2:07 PM      XR Chest AP Portable   Final Result    Right mainstem bronchial intubation. The tube should be   withdrawn about 4 cm.      Dr. Tildon Husky is aware.      Wynema Birch, MD   06/28/2022 4:56 PM      XR OR MISCOUNT   Final Result    No unexpected foreign body.      These critical results were discussed with and acknowledged by Angie R.,   the circulating nurse, who will communicate the findings to Everardo All,   MD on 06/28/2022 3:02 PM.         Bosie Helper, MD   06/28/2022 3:03 PM

## 2022-07-08 NOTE — Progress Notes (Incomplete)
[facility]  [facility] Consult Note    Date Time: 07/08/22 2:25 AM  Admit Date: 06/28/2022 2:25 AM  Patient Name: Shelby Barron  Attending Physician: Ester Rink, DO  Primary Care Physician: Patsy Lager, MD  Location/Room: [id number]   Hospital Day #: 10    [facility] Consult Requested by: ***     Primary Reason for ICU Evaluation:      ***     HPI / Assessment and Plan:      Shelby Barron is a 82 y.o. female with Hx of COPD, prior CVA (2004), T2DM, renal cell carcinoma status post partial bilateral nephrectomy, [name], and recent diagnosis of urothelial cancer. Patient initially admitted on 6/10 for scheduled cystectomy and complete nephrectomy. OR course was complicated by small bowel enterotomy and significant intraoperative bleeding (1 L EBL), patient was admitted to ICU in discontinuity with open abdomen. Patient's hemorrhagic shock was treated with blood transfusions, also treated with broad-spectrum antibiotics. Patient went to the OR on 6/12 for small bowel anastomosis and closure with wound VAC placement. Patient weaned off pressors and extubated to nasal cannula pm 6/13. Initially patient required CRRT due to shock, decision to convert to IHD on 6/15. Course was further complicated by delirium and encephalopathy. Patient downgraded out of ICU on 6/16. Patient with worsening encephalopathy, respiratory culture positive for GNR's patient treated for pneumonia.     ICU consulted on 6/18 for afib with RVR and hypotension during dialysis. BP and HR resolved spontaneously with aborting dialysis. It was felt at this time that afib with RVR and hypotension were a sequelae of worsening sepsis, and she was re-cultured and broadened to meropenem.     On 6/19, patient again became markedly hypotensive around 8pm (SBP 60s). Received 25mg  metop tartrate prior to this. On assessment, she is clinically hypovolemic. She was given 500cc albumin with sustained improvement in blood pressure. Her labs were  largely stable, with the exception of hgb 7.6 -> 6.6, for which 1u RBC was ordered, and lactate was 3.3. Metoprolol was discontinued, and repeat lactate was ordered for 0600.     If hemodynamics remain stable throughout the course of the evening, responds appropriately to blood transfusion (repeat CBC timed for 0800), and lactate improves after receiving albumin and blood, do not anticipate need for escalation to ICU.     Case discussed with Dr. Felipa Furnace of ICU, and Dr. Zada Finders of Hospitalist team. All in agreement.           ---------------------------------------------------------------------------------------------------------------------  This patient has a high probability of sudden clinically significant deterioration which requires the highest level of physician preparedness to intervene urgently. I managed/supervised life or organ supporting interventions that required frequent physician assessments. I devoted my full attention in the ICU to the direct care of this patient for this period of time. See above assessment/plan for organ systems which are failing and require intensive, critical care support. Any critical care time is exclusive of teaching, billable procedures, and not overlapping with any other providers.     Critical Care Time: 35 minutes    Sherlynn Stalls, PA  [facility] Critical Care PA    cc:Pcp, Kathreen Cosier, MD

## 2022-07-08 NOTE — Progress Notes (Addendum)
HD tx ended in , unable to removed fluids, sustaining Afib tachy on tele 140-157s while on dialysis, blood returned, 4L nasal cannula sats 99%, BP wdl, RRT called by [facility] RN.   07/08/22 1530   Treatment Summary   Time Off Machine 1520   Duration of Treatment (Hours)   Treatment Type 1:1   Dialyzer Clearance Moderately streaked   Fluid Volume Off (mL) -197   Prime Volume (mL) 200   Rinseback Volume (mL) 200   Fluid Given: Normal Saline (mL) 0   Fluid Given: PRBC  0 mL   Fluid Given: Albumin (mL) 0   Fluid Given: Other (mL) 0   Total Fluid Given 400   Hemodialysis Net Fluid Removed -597   Post Treatment Assessment   Post-Treatment Weight (Kg) 0   Patient Response to Treatment poorly   Additional Dialyzer Used 0   Permacath Catheter - Tunneled 06/01/22 Internal Jugular Right   Placement Date/Time: 06/01/22 0956   Inserted by: Dr. Arlyce Dice  Access Type: Internal Jugular  Orientation: Right  Central Line Infection Prevention Education provided?: Yes  Hand Hygiene: Alcohol based hand scrub;Soap and water  Line cart used?: No  Li...   Line necessity reviewed? Apheresis/hemodialysis   Site Assessment Clean;Dry;Intact   Catheter Lumen Volume Venous 1.2 mL   Catheter Lumen Volume Arterial 1.2 mL   Dressing Status and Intervention Dressing Intact;Clean & Dry   Tego/Curos Caps on Catheter Yes   NEW Tego/Curos Caps placed (Date) 07/08/22   APHERESIS ONLY:  Access  Red   APHERESIS ONLY: Return Blue   End Caps Free From Blood Yes   Line Care Connections checked and tightened   Dressing Type Transparent;Biopatch   Dressing Status Clean;Dry;Intact   Dressing/Line Intervention Caps changed   Vitals   Temp 98 F (36.7 C)   Heart Rate (!) 139   Resp Rate (!) 30   BP 138/63   SpO2 96 %   Assessment   Mental Status Does not follow commands  (eyes open)   Cardiac (WDL) X   Cardiac Regularity Irregular   Cardiac Rhythm Atrial fibrillation  (RVR)   Respiratory Pattern Tachypneic   Bilateral Breath Sounds Diminished;Rales   R  Breath Sounds Diminished   L Breath Sounds Diminished   Cough Non-productive   Edema  WDL   Generalized Edema +1   General Skin Color Appropriate for ethnicity   Skin Condition/Temp Cool   Gastrointestinal (WDL) X   Abdomen Inspection Distended;Rounded   Mobility Bed   Pain Assessment   Charting Type Reassessment   Pain Scale Used Numeric Scale (0-10)   Numeric Pain Scale   Pain Score 0   POSS Score S   Multiple Pain Sites No   Education   Person taught Patient   Knowledge basis None   Topics taught Procedure   Teaching Tools Inappropriate due to (comments)  (unable to response)   Reponse Unable to Teach Back (see Comments)

## 2022-07-08 NOTE — Progress Notes (Addendum)
HD TX Started via R IJ, BP WDL, HR WDL as of now, nasal cannula sats 99%, eyes open, Report received from [facility] RN.   07/08/22 1500   Bedside Nurse Communication   Name of bedside RN - pre dialysis Roberto Scales RN   Treatment Initiation- With Dialysis Precautions   Time Out/Safety Check Completed Yes   Consent for HD signed for this hospitalization (Date) 06/01/22   Consent for HD signed for this hospitalization (Time) 1516   Preferred language No   Blood Consent Verified N/A   Dialysis Precautions All Connections Secured   Dialysis Treatment Type Routine   Is patient diabetic? Yes   RO/Hemodialysis Cabin crew   Is Total Chlorine less than 0.1 ppm? Yes   Orignial Total Chlorine Testing Time 1500   At 4 Hour Total Chlorine Testing Time 1900   RO/Hemodialysis Arts development officer Number 2    Machine Serial Number [id number]   RO # 21   RO Serial # [id number]   Water Hardness NA   pH 7.2   Pressure Test Verified Yes   Alarms Verified Passed   Machine Temperature 97.7 F (36.5 C)   Alarms Verified Yes   Na+ mEq (Machine) 138 mEq   Bicarb mEq (Machine) 35 mEq   Hemodialysis Conductivity (Machine) 13.6   Hemodialysis Conductivity (Meter) 13.6   Dialyzer Lot Number [id number]   Tubing Lot Number [id number]   RO Machine Log Completed Yes   Hepatitis Status   HBsAg (Antigen) Result Negative   HBsAg Date Drawn 07/03/22   HBsAg Repeat Draw Due Date 07/31/22   Dialysis Weight   Pre-Treatment Weight (Kg) 93.2   Scale Type ICU Bed Scale   Vitals   Temp 98.1 F (36.7 C)   Heart Rate (!) 115   Resp Rate (!) 31   BP 146/72   SpO2 95 %   O2 Device Nasal cannula   Assessment   Mental Status Lethargic, Eyes Open   Cardiac (WDL) X   Cardiac Regularity Irregular   Cardiac Symptoms Return to [facility]   Cardiac Rhythm Atrial fibrillation   Respiratory  WDL   Bilateral Breath Sounds Diminished;Rales   R Breath Sounds Diminished   L Breath Sounds Diminished   Cough Non-productive   Edema  WDL   Generalized Edema +1   General Skin  Color Appropriate for ethnicity   Skin Condition/Temp Cool   Gastrointestinal (WDL) X   Abdomen Inspection Distended;Rounded   Mobility Bed   Permacath Catheter - Tunneled 06/01/22 Internal Jugular Right   Placement Date/Time: 06/01/22 0956   Inserted by: Dr. Arlyce Dice  Access Type: Internal Jugular  Orientation: Right  Central Line Infection Prevention Education provided?: Yes  Hand Hygiene: Alcohol based hand scrub;Soap and water  Line cart used?: No  Li...   Line necessity reviewed? Apheresis/hemodialysis   Site Assessment Clean;Dry;Intact   Catheter Lumen Volume Venous 1.2 mL   Catheter Lumen Volume Arterial 1.2 mL   Dressing Status and Intervention Dressing Intact;Clean & Dry   Tego/Curos Caps on Catheter Yes   NEW Tego/Curos Caps placed (Date) 07/08/22   APHERESIS ONLY:  Access  Red   APHERESIS ONLY: Return Blue   End Caps Free From Blood Yes   Line Care Connections checked and tightened   Dressing Type Transparent;Biopatch   Dressing Status Clean;Dry;Intact   Dressing/Line Intervention Caps changed   Pain Assessment   Charting Type Assessment   Pain Scale Used Numeric Scale (0-10)  Numeric Pain Scale   Pain Score 0   POSS Score 1   Multiple Pain Sites No   Hemodialysis Comments   Pre-Hemodialysis Comments TIMEOUT & SAEFTY CHECKS DONE

## 2022-07-08 NOTE — Plan of Care (Signed)
Patient responds to voices, mitt restraints in place to prevent interference with medical devices, no injury noted to site, blood flow good.  Received oxycodone and tylenol with good effect.    Afib, had episodes of RVR which resolved with rate control meds. Had periods of hypotension. Rrt called, ICU team saw patient and upgrade patient to icu. Given albumin as ordered along with midodrine. Blood pressure gradually improved. Also gave 1 unit of PRBC , Hb improved to 7.1 from 6.6.    On 4L oxygen via nasal cannula    Maintained npo, sugar checks every 4 hours. Rectal tube in place    Patient does not produce urine    Redness to sacrum, scattered bruising, wound vac to abdominal wound, drainage is    Problem: Moderate/High Fall Risk Score >5  Goal: Patient will remain free of falls  Outcome: Progressing  Flowsheets (Taken 07/08/2022 0600)  High (Greater than 13):   MOD-Place Fall Risk level on whiteboard in room   HIGH-Bed alarm on at all times while patient in bed   HIGH-Utilize chair pad alarm for patient while in the chair   HIGH-Pharmacy to initiate evaluation and intervention per protocol   HIGH-Initiate use of floor mats as appropriate   HIGH-Consider use of low bed     Problem: Non-Violent Restraints Interdisciplinary Plan  Goal: Will be injury free during the use of non-violent restraints  Outcome: Progressing  Flowsheets (Taken 07/06/2022 0656 by Claiborne Rigg, RN)  Will be injury free during the use of non-violent restraints:   Attempt all alternatives before use of restraints   Initiate least restrictive type of restraint that is effective   Provide and maintain safe environment   Notify family of initiation of restraints   Include patient/family/caregiver in decisions related to safety   Ensure safety devices are properly applied and maintained   Document observed patient actions according to protocol   Nurse to accompany patient off unit when on restraints   Remove restraints before the indicated  maximum length of time when meets criteria for discontinuation   Document significant changes in patient condition   Provide debriefing as soon as possible and appropriate   Reassess need for continued restraints   Ensure that order for restraints has not expired     Problem: Compromised Sensory Perception  Goal: Sensory Perception Interventions  Outcome: Progressing  Flowsheets (Taken 07/08/2022 0400)  Sensory Perception Interventions: Offload heels, Pad bony prominences, Reposition q 2hrs/turn Clock, Q2 hour skin assessment under devices if present     Problem: Compromised Moisture  Goal: Moisture level Interventions  Outcome: Progressing  Flowsheets (Taken 07/08/2022 0400)  Moisture level Interventions: Moisture wicking products, Moisture barrier cream     Problem: Compromised Activity/Mobility  Goal: Activity/Mobility Interventions  Outcome: Progressing  Flowsheets (Taken 07/08/2022 0400)  Activity/Mobility Interventions: Pad bony prominences, TAP Seated positioning system when OOB, Promote PMP, Reposition q 2 hrs / turn clock, Offload heels     Problem: Compromised Nutrition  Goal: Nutrition Interventions  Outcome: Progressing  Flowsheets (Taken 07/08/2022 0400)  Nutrition Interventions: Discuss nutrition at Rounds, I&Os, Document % meal eaten, Daily weights     Problem: Compromised Friction/Shear  Goal: Friction and Shear Interventions  Outcome: Progressing  Flowsheets (Taken 07/08/2022 0400)  Friction and Shear Interventions: Pad bony prominences, Off load heels, HOB 30 degrees or less unless contraindicated, Consider: TAP seated positioning, Heel foams     Problem: SCIP  Goal: SCIP measures are followed  Outcome: Progressing  Flowsheets (Taken  07/08/2022 0817)  SCIP measures are followed:   Administer antibiotics as ordered   Remove Foley catheter per order/protocol   VTE Prevention: Administer anticoagulant(s) and/or apply anti-embolism stockings/devices as ordered   Provide VTE prophylaxis within 24 hours of  anesthesia end time (Note: SCD to lower extremities)     Problem: Inadequate Airway Clearance  Goal: Patent Airway maintained  Outcome: Progressing  Flowsheets (Taken 07/08/2022 0817)  Patent airway maintained:   Position patient for maximum ventilatory efficiency   Provide adequate fluid intake to liquefy secretions   Suction secretions as needed   Reinforce use of ordered respiratory interventions (i.e. CPAP, BiPAP, Incentive Spirometer, Acapella, etc.)   Reposition patient every 2 hours and as needed unless able to self-reposition     Problem: Safety  Goal: Patient will be free from injury during hospitalization  Outcome: Progressing  Flowsheets (Taken 07/06/2022 0802 by Claiborne Rigg, RN)  Patient will be free from injury during hospitalization:   Assess patient's risk for falls and implement fall prevention plan of care per policy   Provide and maintain safe environment   Use appropriate transfer methods   Ensure appropriate safety devices are available at the bedside     Problem: Pain  Goal: Pain at adequate level as identified by patient  Outcome: Progressing  Flowsheets (Taken 07/08/2022 0400)  Pain at adequate level as identified by patient:   Identify patient comfort function goal   Assess for risk of opioid induced respiratory depression, including snoring/sleep apnea. Alert healthcare team of risk factors identified.   Reassess pain within 30-60 minutes of any procedure/intervention, per Pain Assessment, Intervention, Reassessment (AIR) Cycle   Assess pain on admission, during daily assessment and/or before any "as needed" intervention(s)   Evaluate patient's satisfaction with pain management progress   Offer non-pharmacological pain management interventions   Consult/collaborate with Pain Service     Problem: Hemodynamic Status: Cardiac  Goal: Stable vital signs and fluid balance  Outcome: Progressing  Flowsheets (Taken 07/06/2022 0810 by Claiborne Rigg, RN)  Stable vital signs and fluid balance:   Assess  signs and symptoms associated with cardiac rhythm changes   Monitor lab values     Problem: Renal Instability  Goal: Fluid and electrolyte balance are achieved/maintained  Outcome: Progressing  Flowsheets (Taken 07/08/2022 0817)  Fluid and electrolyte balance are achieved/maintained:   Monitor/assess lab values and report abnormal values   Assess and reassess fluid and electrolyte status   Monitor for muscle weakness   Observe for cardiac arrhythmias   Follow fluid restrictions/IV/PO parameters     Problem: Inadequate Gas Exchange  Goal: Patent Airway maintained  Outcome: Progressing  Flowsheets (Taken 07/08/2022 0817)  Patent airway maintained:   Position patient for maximum ventilatory efficiency   Provide adequate fluid intake to liquefy secretions   Suction secretions as needed   Reinforce use of ordered respiratory interventions (i.e. CPAP, BiPAP, Incentive Spirometer, Acapella, etc.)   Reposition patient every 2 hours and as needed unless able to self-reposition

## 2022-07-08 NOTE — Progress Notes (Signed)
[place] HEART PROGRESS NOTE  [facility]     Date Time: 07/08/22 10:15 AM  Patient Name: Shelby Barron, Shelby Barron  Date of Birth: 07/28/1940  CSN: [id number]  MRN: [id number]       Assessment:   Asked to reevaluate heart rhythm-currently sinus rhythm/sinus tachycardia with heart rate of 100 with recurrent paroxysmal atrial fibrillation noted late last evening with heart rates up to 130.    Echocardiogram June 2024-EF 65%  Encephalopathy-does not respond to verbal interaction and nurse notes she is not responding to pain  Bladder cancer status post bilateral nephrectomies and cystectomy during this admission  History of renal cell carcinoma status post partial nephrectomy previously  End-stage renal disease now maintained on hemodialysis  Anemia  Encephalopathy  Diabetes  History of CVA    Plan:   Would reinitiate beta-blocker  If recurrent atrial arrhythmias with rapid ventricular response noted-would consider initiation of amiodarone  Currently a poor candidate for anticoagulation    Signed by: Sandria Senter, MD    Subjective:   [facility] Medications:      Scheduled Meds: PRN Meds:    acetaminophen, 650 mg, Oral, 4 times per day  albuterol-ipratropium, 3 mL, Nebulization, Q6H SCH  insulin glargine, 7 Units, Subcutaneous, QAM  insulin lispro, 2-10 Units, Subcutaneous, Q4H SCH  meropenem, 500 mg, Intravenous, Q24H  pantoprazole, 40 mg, Intravenous, Daily  thiamine, 100 mg, Intravenous, Q24H SCH        Continuous Infusions:   sodium chloride, , PRN  albumin human, 100 mL, PRN  benzocaine, 1 spray, TID PRN  dextrose, 15 g of glucose, PRN   Or  dextrose, 12.5 g, PRN   Or  dextrose, 12.5 g, PRN   Or  glucagon (rDNA), 1 mg, PRN  diphenhydrAMINE, 25 mg, Q6H PRN  labetalol, 10 mg, Q2H PRN  lidocaine 2% jelly, , Once PRN  naloxone, 0.2 mg, PRN  oxyCODONE, 5 mg, Q6H PRN  phenol, 1 spray, Q2H PRN  sodium chloride, 100 mL, Q1H PRN  sodium chloride, 250 mL, PRN              Home Medications:     Medications  Prior to Admission   Medication Sig Dispense Refill Last Dose    allopurinol (ZYLOPRIM) 100 MG tablet Take 1 tablet (100 mg) by mouth daily   06/28/2022 at 0500    atorvastatin (LIPITOR) 40 MG tablet Take 1 tablet (40 mg) by mouth daily   06/27/2022 at 2300    conjugated estrogens (PREMARIN) vaginal cream Place vaginally daily   Past Week    insulin aspart (NOVOLOG) 100 UNIT/ML injection Inject 5-10 Units into the skin as needed   06/27/2022 at 1800    Insulin Degludec Evaristo Bury) 100 UNIT/ML Solution Inject 36 Units into the skin daily   06/27/2022 at 1800    metoprolol tartrate (LOPRESSOR) 25 MG tablet Take 0.5 tablets (12.5 mg) by mouth daily   Past Month    montelukast (SINGULAIR) 10 MG tablet Take 1 tablet (10 mg) by mouth nightly   Past Week    ondansetron (Zofran) 4 MG tablet Take 1 tablet (4 mg) by mouth every 8 (eight) hours as needed for Nausea 20 tablet 0 06/27/2022 at 1300    oxybutynin XL (DITROPAN-XL) 5 MG 24 hr tablet Take 1 tablet (5 mg) by mouth daily   06/28/2022 at 1800    oxyCODONE (OXY-IR) 5 MG capsule Take 1 capsule (5 mg) by mouth every 4 (four) hours as needed  06/27/2022 at 2200    pantoprazole (PROTONIX) 40 MG tablet Take 1 tablet (40 mg) by mouth daily   Past Week    pregabalin (LYRICA) 50 MG capsule Take 1 capsule (50 mg) by mouth Per pt has not been taking   06/27/2022 at 1800    sevelamer carbonate (RENVELA) 800 MG tablet Take 1 tablet (800 mg) by mouth 2 (two) times daily   Past Week    torsemide (DEMADEX) 100 MG tablet Take 1 tablet (100 mg) by mouth daily   06/27/2022 at 1800    umeclidinium-vilanterol (Anoro Ellipta) 62.5-25 MCG/ACT Aerosol Pwdr, Breath Activated Inhale 1 puff into the lungs daily   06/27/2022 at 1800    Zolpidem Tartrate (AMBIEN PO) Take 5 mg by mouth as needed   Past Week    b complex vitamins capsule Take 1 capsule by mouth daily   06/23/2022    cinacalcet (SENSIPAR) 30 MG tablet Take 1 tablet (30 mg) by mouth daily   More than a month    clopidogrel (PLAVIX) 75 MG tablet Take 1  tablet (75 mg) by mouth daily   06/21/2022    hyoscyamine (LEVSIN) 0.125 MG tablet Take 1 tablet (0.125 mg) by mouth every 4 (four) hours as needed for Cramping 5 tablet 0 06/24/2022         Physical Exam:     VITAL SIGNS   Temp:  [96.5 F (35.8 C)-98.8 F (37.1 C)] 98.5 F (36.9 C)  Heart Rate:  [83-140] 95  Resp Rate:  [24-47] 28  BP: (66-163)/(42-85) 162/72  FiO2:  [32 %] 32 %  POCT Glucose Result (Read Only)  Avg: 193.4  Min: 88  Max: 297  SpO2: 98 %    Intake/Output Summary (Last 24 hours) at 07/08/2022 1015  Last data filed at 07/08/2022 0600  Gross per 24 hour   Intake 1230 ml   Output 1050 ml   Net 180 ml          Constitutional: Eyes open, unresponsive to commands  Neck: No carotid bruits  Cardiac: Regular rate and rhythm, normal S1 and S2; soft systolic murmur   pulmonary: Grossly clear anteriorly  Extremities: no edema.  Vascular: +2 pulses in radial artery bilaterally      Labs:     CBC w/Diff   Recent Labs   Lab 07/08/22  0610 07/07/22  2120 07/07/22  0358   WBC 34.08* 31.44* 31.38*   Hemoglobin 7.1* 6.6* 7.6*   Hematocrit 21.6* 21.1* 22.8*   Platelet Count 298 309 358*          Comprehensive Metabolic Profile   Recent Labs   Lab 07/08/22  0610 07/07/22  2120 07/07/22  0358 07/06/22  1545 07/06/22  0341 07/03/22  1412 07/03/22  0347 07/02/22  1122 07/02/22  0502   Sodium 140 134* 138  More results in Results Review 139  More results in Results Review 136  More results in Results Review 136   Potassium 4.1 4.0 3.8  More results in Results Review 4.5  More results in Results Review 4.3  More results in Results Review 4.2   Chloride 107 103 104  More results in Results Review 106  More results in Results Review 106  More results in Results Review 106   CO2 19 18 21   More results in Results Review 21  More results in Results Review 23  More results in Results Review 25   BUN 99* 94* 68*  More results in  Results Review 31*  More results in Results Review 23*  More results in Results Review 14   Creatinine 5.6*  5.3* 4.2*  More results in Results Review 5.3*  More results in Results Review 1.9*  More results in Results Review 1.4*   Calcium 8.4 8.2 8.1  More results in Results Review 7.7*  More results in Results Review 10.4*  More results in Results Review 8.3   Albumin  --  1.4*  --   --  1.3*  --  1.4*  1.4*  More results in Results Review 1.5*  1.4*   Protein, Total  --  4.1*  --   --   --   --  5.1*  --  4.0*   Bilirubin, Total  --  0.1*  --   --   --   --  0.2  --  0.3   Alkaline Phosphatase  --  69  --   --   --   --  61  --  64   ALT  --  15  --   --   --   --  16  --  18   AST (SGOT)  --  23  --   --   --   --  16  --  23   Glucose 179* 277* 230*  More results in Results Review 210*  More results in Results Review 238*  More results in Results Review 211*   Magnesium 2.2 2.3 2.1  --  2.3  More results in Results Review 2.4  --   --    More results in Results Review = values in this interval not displayed.          Cardiac Enzymes         Lab Results   Component Value Date    BNP 696 (H) 05/26/2022          Thyroid Studies          Invalid input(s): "FREET4"        Lipid Profile            Coagulation Studies   Recent Labs   Lab 07/01/22  1120   PT 12.6   INR 1.1   PTT 35     No results found for: "DDIMER"         Telemetry:   Currently sinus rhythm/sinus tachycardia.  Atrial fibrillation with rapid ventricular response noted late last evening    Rads:   XR Chest AP Portable    Result Date: 07/07/2022  History: sob. Technique: :XR CHEST AP PORTABLE Comparison: June 1824 Findings: There is mild interstitial prominence There is no pneumothorax. Dialysis catheter and central venous catheter tips in the right atrium. Feeding tube extends into the stomach but below bottom of    The mediastinum is within normal limits.      There is mild interstitial prominence suggesting mild congestive heart failure/fluid overload in the setting of shortness of breath [name], MD 07/07/2022 12:32 PM    XR Chest AP Portable    Result  Date: 07/06/2022  HISTORY: Concern for worsening PNA.  COMPARISON: 06/30/2022 FINDINGS: Support tube/line: Enteric tube terminates within the duodenum at the right upper quadrant. There are 2 right-sided central venous catheters with tips terminating over the right atrium. Lungs / pleural space: Lungs are hypoventilated with basilar opacities likely atelectasis. The lungs are clear. No large pleural effusion or pneumothorax. Mediastinum: Air-filled structure in the left heart  likely represents an air-filled hiatal hernia. Borderline cardiac enlargement likely accentuated due to hypoventilation. Bones and other soft tissues: No acute osseous abnormality.      Hypoventilation with basilar atelectasis. Charlott Rakes, MD 07/06/2022 3:35 PM    CT Abdomen Pelvis W IV And PO Cont    Result Date: 07/06/2022  HISTORY: Evaluate for intra-abdominal pathology. Leukocytosis after small bowel resection, bilateral nephrectomy and cystectomy. COMPARISON: CT chest abdomen pelvis 05/26/2022 TECHNIQUE: CT abdomen and pelvis WITH intravenous contrast. The following dose reduction techniques were utilized: Automated exposure control and/or adjustment of the mA and/or kV according to patient size, and the use of iterative reconstruction technique. CONTRAST: iohexol (OMNIPAQUE) 350 MG/ML injection 100 mL. FINDINGS: The lung bases are grossly clear. There may be a trace right pleural effusion. There is a moderate size hiatal hernia. The abdominal aorta is normal caliber. The liver, gallbladder, spleen, pancreas and adrenal glands show no acute abnormality. Postoperative changes from bilateral nephrectomy and cystectomy. There is slightly mixed density fluid collection in the left nephrectomy bed measuring 5.0 x 3.1 cm, likely postoperative seroma/hematoma. Small loculated collections of fluid and gas are seen in the anterior midline abdomen subjacent to the midline wound. Small amount of ascites is seen elsewhere within the abdomen and pelvis  including some areas that also appear loculated. No rim-enhancing fluid collections are identified. No extravasation of oral contrast is seen. An enteric tube extends to the proximal duodenum. There is no bowel wall thickening or obstruction. There is diverticulosis. There is fluid throughout the colon. No suspicious or destructive osseous lesion.     1.Postoperative changes from bilateral nephrectomy and cystectomy. 2.Small amount of ascites including some areas which appear loculated. Small collections of fluid and gas in the anterior midline abdomen may be postoperative but superimposed infection is not excluded. No rim-enhancing fluid collections or extravasation of oral contrast to suggest anastomotic leak. Demetrios Isaacs, MD 07/06/2022 5:51 AM    CT Head WO Contrast    Result Date: 07/06/2022  HISTORY: Altered mental status. COMPARISON: Brain MRI 09/20/2008. TECHNIQUE: CT of the head performed without intravenous contrast. The following dose reduction techniques were utilized: automated exposure control and/or adjustment of the mA and/or KV according to patient size, and the use of an iterative reconstruction technique. CONTRAST: None. FINDINGS: The ventricles and sulcal pattern are within normal limits for the patient's stated age. There is mild-moderate supratentorial chronic ischemic disease. No acute infarct is noted. No intracranial hemorrhage is seen. There is minimal paranasal sinus disease. There is moderate right mastoid opacification. 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      1.No acute intracranial abnormality is seen. Leandro Reasoner, MD 07/06/2022 3:56 AM    XR Abdomen AP    Result Date: 07/05/2022  HISTORY: Feeding tube placement. COMPARISON: None. FINDINGS: Enteric tube tip projects over the distal stomach. Surgical clips project over the upper abdomen. Nonobstructed bowel gas pattern.     Enteric tube terminates in the distal stomach. Elease Etienne, DO 07/05/2022 12:24 PM    XR Chest AP Portable    Result  Date: 06/30/2022  HISTORY: Status post dialysis catheter placement COMPARISON: Study performed earlier the same day FINDINGS: Endotracheal tube is 3.9 cm from the carina. Enteric tube is in the stomach. Right internal jugular catheter in the cavoatrial junction. Right internal jugular dual lumen catheter in the right atrium. No pneumothorax is seen. Bibasilar atelectasis. Cardiac mediastinal silhouette is stable. There is pulmonary vascular congestion. Trace left pleural effusion.  1. Lines and tubes in adequate position. No pneumothorax. 2. Trace left pleural effusion and bibasilar atelectasis. Jasmine December D'Heureux, MD 06/30/2022 4:53 PM    XR Chest AP Portable    Result Date: 06/30/2022  HISTORY: Hemodialysis catheter placement. COMPARISON: 06/28/2022. FINDINGS: The endotracheal tube is now in good position 2.7 cm above the carina. Enteric tube in the stomach. The dialysis catheter with its tip into right atrium. Trace left-sided pleural effusion. No focal consolidation. No pneumothorax. Mild interstitial edema.     Catheters as described. Pankaj Dominica, MD 06/30/2022 2:07 PM    XR Chest AP Portable    Result Date: 06/28/2022  HISTORY: Intubation COMPARISON: 5/12 FINDINGS: Endotracheal tube tip is in the right mainstem bronchus esophageal sump tube tip in the stomach. Right-sided dialysis catheter tip extends into the right atrium. Hypoinflated lungs. Small foci of atelectasis in the lung bases. Moderate hiatal hernia. No definite pulmonary edema. No visible pneumothorax or pleural effusion.      Right mainstem bronchial intubation. The tube should be withdrawn about 4 cm. Dr. Tildon Husky is aware. Wynema Birch, MD 06/28/2022 4:56 PM    XR OR MISCOUNT    Result Date: 06/28/2022  HISTORY: Surgical instrument miscount. COMPARISON: None. FINDINGS: No unexpected foreign body is identified.      No unexpected foreign body. These critical results were discussed with and acknowledged by Angie R., the circulating nurse, who will  communicate the findings to Everardo All, MD on 06/28/2022 3:02 PM. Bosie Helper, MD 06/28/2022 3:03 PM    Echo 2D Complete    Result Date: 06/25/2022  Name:     TARRON OLSSON Age:     54 years DOB:     1940-06-13 Gender:     Female MRN:     [id number] Wt:     192 lb BSA:     2.00 m2 Technical Quality:     Adequate Exam Date/Time:     06/25/2022 2:28 PM Study Site:     [facility] Exam Type:     ECHO ADULT TTE COMPLETE Study Info Indications     Z01.810-Encounter for preprocedural cardiovascular examination -     R06.02-Shortness of breath - Procedure   Complete two-dimensional, color flow and spectral Doppler transthoracic echocardiogram is performed. Reading Physician:     Juan Quam MD, [facility] Staff Sonographer:     Dollene Primrose RDCS Ordering Physician:     Montey Hora MD, [facility] in Prior Cardiac Studies No Prior Echo Available.     -- Summary   * There is concentric left ventricular remodeling.   * Left ventricular systolic function is normal with an ejection fraction by Biplane Method of Discs of  65 %.   * There is Grade I diastolic dysfunction (impaired relaxation pattern).   * There is very mild aortic stenosis with a peak velocity of  2.2 m/s, mean gradient of  11 mmHg, and aortic valve area of  1.7 cm2.   * Normal variant lipomatous hypertrophy of the interatrial septum is present.   * No major change from prior outside study from 2019. Recommendations   * I will share these results via MyChart. There are no markers of high risk for noncardiac surgery seen on this exam. Findings Left Ventricle   The left ventricle is normal in size. There is concentric left ventricular remodeling. Left ventricular systolic function is normal with an ejection fraction by Biplane Method of Discs of  65 %. Left ventricular segmental wall motion is  normal. There is Grade I diastolic dysfunction (impaired relaxation pattern). Right Ventricle   The right ventricular cavity size is normal in size. Normal right ventricular systolic  function. Left Atrium   The left atrium is normal in size. Right Atrium   The right atrium is normal in size. Atrial Septum   No evidence of interatrial shunt by color Doppler. Normal variant lipomatous hypertrophy of the interatrial septum is present. Aortic Valve   The aortic valve is tricuspid. There is no aortic regurgitation. There is moderate thickening and calcification of the aortic leaflets. There is very mild aortic stenosis with a peak velocity of  2.2 m/s, mean gradient of  11 mmHg, and aortic valve area of  1.7 cm2. Pulmonary Valve   The pulmonic valve is structurally normal. There is no pulmonic regurgitation. Mitral Valve   The mitral valve is structurally normal. There is trace mitral regurgitation. Tricuspid Valve   The tricuspid valve is structurally normal. There is trace tricuspid regurgitation. No pulmonary hypertension. Aorta   The aortic root is normal in size at 2.6 cm in diameter. The ascending aorta is normal in size at 3.0 cm in diameter. Inferior Vena Cava   The IVC is normal in size with > 50% respiratory variance consistent with normal RA pressure of 3 mmHg. Pericardium / Pleural Effusion   No pericardial effusion visualized. Measurements 2D Measurements ---------------------------------------------------------------------- Name                                 Value        Normal ---------------------------------------------------------------------- Parasternal 2D ----------------------------------------------------------------------  IVS Diastolic Thickness (2D)                               1.00 cm     0.60-0.90 LVID Diastole (2D)                 4.30 cm     3.80-5.20 LVID Diastolic Index (2D)        2.1 cm/m2       2.3-3.1  LVIW Diastolic Thickness (2D)                               1.00 cm     0.60-0.95 LVID Systole (2D)                  2.30 cm     2.20-3.50 LVOT Diameter                      2.00 cm                LV Fractional Shortening (2D)                                  47 %          27-45 LA Dimension (2D)                  4.30 cm     2.70-3.80 Ao Root Diameter (2D)              2.60 cm     2.70-3.70 Ao Root Diameter (2D) Index     1.30 cm/m2         <  2.20 Prox Asc Ao Diameter               3.00 cm     2.30-3.10 Prox Asc Ao Diameter Index      1.50 cm/m2     1.30-1.90 LV Ejection Fraction 2D ---------------------------------------------------------------------- LV EF (BP MOD)                        65 %         54-74 Apical 2D Dimensions ----------------------------------------------------------------------  RV Basal Diastolic Dimension                          2.80 cm     2.50-4.10 LA Volume (BP MOD)                60.30 ml               LA Volume Index (BP MOD)        30.08 ml/m2   16.00-34.00 RA Area (4C)                     14.30 cm2       <=18.00 M-mode Measurements ---------------------------------------------------------------------- Name                                 Value        Normal ---------------------------------------------------------------------- M-Mode ---------------------------------------------------------------------- TAPSE                              1.60 cm        >=1.60 LVOT/Aortic Valve Doppler Measurements ---------------------------------------------------------------------- Name                                 Value        Normal ---------------------------------------------------------------------- LVOT Doppler ---------------------------------------------------------------------- LVOT VTI                          26.90 cm               LVOT Peak Velocity                1.07 m/s               LVOT SV                              84 ml               LVOT SV Index                    42 ml/m\S\2               AoV Doppler ---------------------------------------------------------------------- AV VTI                            50.90 cm               AV Peak Velocity                  2.23 m/s               AV Peak Gradient  20 mmHg                AV Mean Gradient                   11 mmHg           <20 AV Area Index (Cont Eq Vel)     0.8 cm2/m2               AV Area (Cont Eq VTI)             1.66 cm2        >=3.00 AV Area Index (Cont Eq VTI)     0.83 cm2/m2               AV V1/V2 Ratio                        0.48 RVOT/Pulmonic Valve Doppler Measurements ---------------------------------------------------------------------- Name                                 Value        Normal ---------------------------------------------------------------------- PV Doppler ---------------------------------------------------------------------- PV Peak Velocity                  1.20 m/s Mitral Valve Measurements ---------------------------------------------------------------------- Name                                 Value        Normal ---------------------------------------------------------------------- MV Doppler ---------------------------------------------------------------------- MV E Peak Velocity                1.00 m/s               MV A Peak Velocity                1.10 m/s               MV E/A                                0.91               MV Decel Time                       218 ms               MV Annular TDI ---------------------------------------------------------------------- MV Septal e' Velocity             0.07 m/s        >=0.07 MV Lateral e' Velocity            0.09 m/s        >=0.10 MV E/e' (Average)                    13.06       <=14.00 Tricuspid Valve Measurements ---------------------------------------------------------------------- Name                                 Value        Normal ---------------------------------------------------------------------- TV Annular TDI ---------------------------------------------------------------------- TV Lateral Ann s' Velocity        0.14 m/s     0.10-0.19 Aorta / Venous Measurements ---------------------------------------------------------------------- Name  Value         Normal ---------------------------------------------------------------------- IVC/SVC ---------------------------------------------------------------------- IVC Diameter (Exp 2D)              1.70 cm        <=2.10 Report Signatures Finalized by Juan Quam  MD, [facility] on 06/25/2022 03:21 PM Preliminary amended by Dollene Primrose  RDCS on 06/25/2022 03:11 PM Preliminary by Dollene Primrose  RDCS on 06/25/2022 03:09 PM        [facility]  Secure Chat : [facility]  - Hospital rounding team 7:30 AM-5 PM.   Nonurgent messages/consults left after 5pm will be seen 7:30 AM.  NP Spectralink [phone] (8am-12pm)    After hours, non urgent consult line [phone] or secure chat [facility]  After Hours, urgent consults or to reach on call physician [phone]

## 2022-07-08 NOTE — Nursing Progress Note (Signed)
4 eyes in 4 hours pressure injury assessment note:      Completed with:   Unit & Time admitted:              Bony Prominences: Check appropriate box; if wound is present enter wound assessment in LDA     Occiput:                 [x] WNL  []  Wound present  Face:                     [x] WNL  []  Wound present  Ears:                      [x] WNL  []  Wound present  Spine:                    [x] WNL  []  Wound present  Shoulders:             [x] WNL  []  Wound present  Elbows:                  [x] WNL  []  Wound present  Sacrum/coccyx:     [x] WNL  []  Wound present  Ischial Tuberosity:  [x] WNL  []  Wound present  Trochanter/Hip:      [x] WNL  []  Wound present  Knees:                   [x] WNL  []  Wound present  Ankles:                   [x] WNL  []  Wound present  Heels:                    [x] WNL  []  Wound present  Other pressure areas:  [x]  Wound location-abdomen (wound vac and staples); peri area       Device related: []  Device name:         LDA completed if wound present: yes/no  Consult WOCN if necessary    Other skin related issues, ie tears, rash, etc, document in Integumentary flowsheet

## 2022-07-08 NOTE — Progress Notes (Signed)
Infectious Diseases & Tropical Medicine  Progress Note    07/08/2022   Serafina Emmanuel ZOX:[id number],XBJ:[id number] is a 82 y.o. female,       Assessment:     Encephalopathy  History of bladder cancer and renal failure  Status post radical cystectomy/bilateral nephrectomy, enterectomy and lysis of adhesion (06/28/2022)  S/p small bowel resection  Upper GI bleed-GI following  Risk of aspiration  Sputum culture-few gram-negative rods (small quantity)-not processed  Leukocytosis-on steroids    Plan:     Continue meropenem  Start Mycamine  Follow-up chest x-ray  Continue probiotics  Monitor electrolytes and renal functions closely  Monitor clinically  Discussed with nursing staff           ROS:     General:  no fever, unresponsive  Respiratory: No cough or shortness of breath  Gastrointestinal: Rectal tube in place-dark-colored stools  Genito-Urinary: no hematuria  Musculoskeletal: no edema  Neurological: unresponsive  Dermatological: No rash    Physical Examination:     Blood pressure 147/65, pulse 95, temperature 98.5 F (36.9 C), temperature source Axillary, resp. rate (!) 26, height 1.575 m (5\' 2" ), weight 93.2 kg (205 lb 7.5 oz), SpO2 97%.    General Appearance: NG tube in place  HEENT: Pupils are equal, round, and reactive to light.   Lungs: Decreased breath sounds  Heart: Tachycardia  Chest: Symmetric chest wall expansion.   Abdomen: soft, no hepatosplenomegaly, no bowel sounds  Neurological: unresponsive  Extremities: No edema    Laboratory And Diagnostic Studies:     Recent Labs     07/08/22  0610 07/07/22  2120   WBC 34.08* 31.44*   Hemoglobin 7.1* 6.6*   Hematocrit 21.6* 21.1*   Platelet Count 298 309     Recent Labs     07/08/22  0610 07/07/22  2120   Sodium 140 134*   Potassium 4.1 4.0   Chloride 107 103   CO2 19 18   BUN 99* 94*   Creatinine 5.6* 5.3*   Glucose 179* 277*   Calcium 8.4 8.2     Recent Labs     07/07/22  2120 07/06/22  0341   AST (SGOT) 23  --    ALT 15  --    Alkaline Phosphatase 69  --     Protein, Total 4.1*  --    Albumin 1.4* 1.3*       Current Meds:      Scheduled Meds: PRN Meds:    acetaminophen, 650 mg, Oral, 4 times per day  albuterol-ipratropium, 3 mL, Nebulization, Q6H SCH  heparin (porcine), 5,000 Units, Subcutaneous, Q12H SCH  insulin glargine, 7 Units, Subcutaneous, QAM  insulin lispro, 2-10 Units, Subcutaneous, Q4H SCH  meropenem, 500 mg, Intravenous, Q24H  pantoprazole, 40 mg, Intravenous, Daily  thiamine, 100 mg, Intravenous, Q24H SCH        Continuous Infusions:     sodium chloride, , PRN  albumin human, 100 mL, PRN  benzocaine, 1 spray, TID PRN  dextrose, 15 g of glucose, PRN   Or  dextrose, 12.5 g, PRN   Or  dextrose, 12.5 g, PRN   Or  glucagon (rDNA), 1 mg, PRN  diphenhydrAMINE, 25 mg, Q6H PRN  labetalol, 10 mg, Q2H PRN  lidocaine 2% jelly, , Once PRN  naloxone, 0.2 mg, PRN  oxyCODONE, 5 mg, Q6H PRN  phenol, 1 spray, Q2H PRN  sodium chloride, 100 mL, Q1H PRN  sodium chloride, 250 mL, PRN  Tyberius Ryner A. Janalyn Rouse, M.D.  07/08/2022  9:17 AM

## 2022-07-08 NOTE — Plan of Care (Signed)
Problem: Renal Instability  Goal: Fluid and electrolyte balance are achieved/maintained  Flowsheets (Taken 07/08/2022 1406 by Roberto Scales, RN)  Fluid and electrolyte balance are achieved/maintained:   Monitor/assess lab values and report abnormal values   Assess and reassess fluid and electrolyte status   Observe for cardiac arrhythmias   Monitor for muscle weakness   Follow fluid restrictions/IV/PO parameters     Problem: Patient Receiving Advanced Renal Therapies  Goal: Therapy access site remains intact  Flowsheets (Taken 07/06/2022 1009 by Bunnie Philips, RN)  Therapy access site remains intact:   Assess therapy access site   Change therapy access site dressing as needed

## 2022-07-08 NOTE — Plan of Care (Signed)
Review of Systems  Neuro:  UTA orientation; pt non-responsive  At beginning of shift RUE slightly responded to pain, but throughout shift became completely unresponsive to pain as well    PRN pain med given 1x    Bilateral mitten restraints were used for beginning of the shift to prevent pt from interfering with medical treatment; spouse and daughter aware. Mittens also discontinued.      Cardiac:  Afebrile  HR normal sinus to sinus tachy  HR went into afib RVR after dialysis started-RRT was called and pt given 5mg  of metoprolol IV and started on Amio gtt  HR from 90s to 140s throughout shift  SBP 100 to 160     Respiratory:  4L nasal canula  Diminished breathsounds      GI/GU:  Abdomen distended and tender, bowel sounds present  Tube feed held  Rectal tube in w/60 cc output  Anuric    MD informed of sepsis and mews epic pop up (lactic was drawn and is normal range)    Mouth care and turning of pt completed q2  Continue to monitor pt and follow plan of care  Family visited and called and updated           Problem: Moderate/High Fall Risk Score >5  Goal: Patient will remain free of falls  Outcome: Progressing  Flowsheets (Taken 07/08/2022 0800)  High (Greater than 13):   HIGH-Consider use of low bed   HIGH-Initiate use of floor mats as appropriate   HIGH-Apply yellow "Fall Risk" arm band   HIGH-Bed alarm on at all times while patient in bed   HIGH-Visual cue at entrance to patient's room     Problem: Non-Violent Restraints Interdisciplinary Plan  Goal: Will be injury free during the use of non-violent restraints  Outcome: Progressing  Flowsheets (Taken 07/08/2022 1403)  Will be injury free during the use of non-violent restraints:   Attempt all alternatives before use of restraints   Initiate least restrictive type of restraint that is effective   Notify family of initiation of restraints   Provide and maintain safe environment   Include patient/family/caregiver in decisions related to safety   Ensure safety devices  are properly applied and maintained   Document observed patient actions according to protocol   Nurse to accompany patient off unit when on restraints   Provide debriefing as soon as possible and appropriate   Ensure that order for restraints has not expired   Reassess need for continued restraints   Document significant changes in patient condition   Remove restraints before the indicated maximum length of time when meets criteria for discontinuation     Problem: Compromised Sensory Perception  Goal: Sensory Perception Interventions  Outcome: Progressing  Flowsheets (Taken 07/08/2022 0800)  Sensory Perception Interventions: Offload heels, Pad bony prominences, Reposition q 2hrs/turn Clock, Q2 hour skin assessment under devices if present     Problem: Compromised Moisture  Goal: Moisture level Interventions  Outcome: Progressing  Flowsheets (Taken 07/08/2022 0800)  Moisture level Interventions: Moisture wicking products, Moisture barrier cream     Problem: Compromised Activity/Mobility  Goal: Activity/Mobility Interventions  Outcome: Progressing  Flowsheets (Taken 07/08/2022 0800)  Activity/Mobility Interventions: Pad bony prominences, TAP Seated positioning system when OOB, Promote PMP, Reposition q 2 hrs / turn clock, Offload heels     Problem: Compromised Friction/Shear  Goal: Friction and Shear Interventions  Outcome: Progressing  Flowsheets (Taken 07/08/2022 1406)  Friction and Shear Interventions: Pad bony prominences, Off load heels, HOB 30 degrees  or less unless contraindicated, Consider: TAP seated positioning, Heel foams     Problem: SCIP  Goal: SCIP measures are followed  Outcome: Progressing  Flowsheets (Taken 07/08/2022 1406)  SCIP measures are followed:   Administer antibiotics as ordered   VTE Prevention: Administer anticoagulant(s) and/or apply anti-embolism stockings/devices as ordered     Problem: Infection Prevention  Goal: Free from infection  Outcome: Progressing  Flowsheets (Taken 07/08/2022  1406)  Free from infection:   Monitor/assess vital signs   Encourage/assist patient to turn, cough and perform deep breathing every 2 hours   Assess incision for evidence of healing   Assess for signs and symptoms of infection   Monitor/assess lab values and report abnormal values   Monitor/assess output from surgical drain if present     Problem: Safety  Goal: Patient will be free from injury during hospitalization  Outcome: Progressing  Flowsheets (Taken 07/08/2022 1406)  Patient will be free from injury during hospitalization:   Assess patient's risk for falls and implement fall prevention plan of care per policy   Use appropriate transfer methods   Provide and maintain safe environment   Ensure appropriate safety devices are available at the bedside   Include patient/ family/ care giver in decisions related to safety   Hourly rounding   Assess for patients risk for elopement and implement Elopement Risk Plan per policy   Provide alternative method of communication if needed (communication boards, writing)     Problem: Safety  Goal: Patient will be free from infection during hospitalization  Outcome: Progressing  Flowsheets (Taken 07/08/2022 1406)  Free from Infection during hospitalization:   Assess and monitor for signs and symptoms of infection   Monitor lab/diagnostic results   Monitor all insertion sites (i.e. indwelling lines, tubes, urinary catheters, and drains)   Encourage patient and family to use good hand hygiene technique     Problem: Renal Instability  Goal: Fluid and electrolyte balance are achieved/maintained  Outcome: Progressing  Flowsheets (Taken 07/08/2022 1406)  Fluid and electrolyte balance are achieved/maintained:   Monitor/assess lab values and report abnormal values   Assess and reassess fluid and electrolyte status   Observe for cardiac arrhythmias   Monitor for muscle weakness   Follow fluid restrictions/IV/PO parameters

## 2022-07-08 NOTE — UM Notes (Signed)
Continued Stay Review  Class: Inpatient  Level of Care: Level 2 (Intermediate Care)         Patient Name: Shelby Barron, Shelby Barron   Date of Birth 1941/01/07   MRN: [id number]          Summary:  Shelby Barron is a 82 y.o. female who presented to the hospital "for scheduled cystectomy and completion nephrectomy with urology.  Her last Plavix dose was 6/3, her last dialysis was 6/8.  She had preop clearance with cardiology which included an echo."     "In the OR course was complicated by  Small bowel enterotomy -General surgery called in enterotomy repaired with initial plan to put patient back into continuity"     "Later in the case significant bleeding during nephrectomy/cystectomy.  Approximately 1 L EBL.  Given significant bleeding decision made to leave patient in discontinuity and abdomen open with plans to return to the OR tomorrow."     "In the OR given  3 units PRBC  3000 mL crystalloid  colloid"         Medical History:  Past Medical History:   Diagnosis Date    Acid reflux     Asthma, well controlled     Cancer of kidney     Chronic lower back pain     Congestive heart disease     COPD (chronic obstructive pulmonary disease)     CVA (cerebral infarction) 01/12/2003    Diabetes     Diabetic nephropathy     Fibroids     Gout     H/O: gout     Hyperlipidemia     Hypertension     Neuropathy of hand     SOB (shortness of breath)            Active Problems:  Patient Active Problem List   Diagnosis    Type 2 diabetes mellitus with kidney complication, with long-term current use of insulin    Diabetes mellitus type II, uncontrolled    Neuropathy of hand    Left renal mass    Hamstring injury    Peritonitis    ESRD (end stage renal disease)    Chronic diastolic congestive heart failure    Aortic atherosclerosis    Malignant neoplasm of urinary bladder, unspecified site    Malignant tumor of kidney, left    Benign essential hypertension    GERD (gastroesophageal reflux disease)    Leukocytosis    Bladder cancer     Anemia    COPD (chronic obstructive pulmonary disease)           Care Day - 07/08/2022      Per Nephrology Note:  Assessment:      ESRD initially maintained on CCPD (catheter placed in NC) - converted to HD in May 2024 due to inability to reposition catheter into pelvis   - [facility]TS  - s/p CRRT this admission  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 s/p bilateral radical nephrectomies and  cystectomy on 06/28/22  - complicated by small bowel enterotomy and blood loss   New onset afib - ICU consulted  High grade urothelial bladder CA: s/p bilateral nephrectomies and cystectomy c/b mall bowel enterotomy and blood loss (~1L) on 06/28/22  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 (approx 1L) - stable  Hypophosphatemia  - better  Encephalopathy - persists  Leukocytosis - worse despite being on abx  Recommendations:      WBC remains elevated - BCx pending  Will try HD today with UF  Would benefit from 1 unit PRBC          Per Cardiology Note:  Plan:   Would reinitiate beta-blocker  If recurrent atrial arrhythmias with rapid ventricular response noted-would consider initiation of amiodarone  Currently a poor candidate for anticoagulation          Per GI Note:  Assessment:   # UGI bleed  - coffee grounds in oral cavity  - melena in rectal tube     # S/p cystectomy and completion nephrectomy c/b small bowel enterotomy 6/10  # S/p small bowel anastomosis, closure of mesenteric deveft, closure abdominal wall with wound VAC 6/12  # Encephalopathy/delirium  # Acute hypoxemic respiratory failure (3-4 L NC)  # [name] T/TH/Sat  # Renal cell carcinoma  # Urothelial cell carcinoma  # H/o CVA on plavix PTA        Plan:   Ddx includes stress ulcer, esophagitis +/- anastomotic bleed/ulcer  - Medical mgmt with IV PPI BID for now. Monitor clinical status.   - Patient is high risk for procedures. If able to manage medically, that would be preferred.  - Monitor H/H,  transfuse as needed        Per Rapid Response Physician Note:  RRT Attending Note     Late entry due to patient care.  Called to respond to RRT due to atrial fibrillation with RVR which developed during initiation of dialysis.  Per RN at bedside, patient was briefly hypotensive overnight, improved with albumin infusion, after which her metoprolol was held.     Rates in 130s to 140s during my evaluation, with SBP in the 130s.  EKG showed atrial fibrillation with RVR.  Improved to rates in the 110s after metoprolol IV push 5 mg x 1.  Resume metoprolol 25 mg via NG tube.          Vitals:  Patient Vitals for the past 24 hrs:   BP Temp Temp src Pulse Resp SpO2 Weight   07/08/22 1630 148/68 -- -- (!) 125 (!) 30 94 % --   07/08/22 1622 146/69 -- -- (!) 120 (!) 26 96 % --   07/08/22 1621 -- -- -- (!) 123 (!) 29 98 % --   07/08/22 1620 124/62 -- -- (!) 126 (!) 30 96 % --   07/08/22 1614 120/55 -- -- (!) 126 (!) 26 94 % --   07/08/22 1610 102/59 -- -- (!) 129 (!) 30 98 % --   07/08/22 1600 100/56 98.8 F (37.1 C) Axillary (!) 126 (!) 26 97 % --   07/08/22 1545 120/58 -- -- (!) 118 (!) 25 95 % --   07/08/22 1540 113/56 -- -- (!) 114 (!) 27 95 % --   07/08/22 1539 110/55 -- -- (!) 115 (!) 26 96 % --   07/08/22 1536 104/53 -- -- (!) 110 (!) 26 93 % --   07/08/22 1535 -- -- -- (!) 116 (!) 26 94 % --   07/08/22 1532 123/60 -- -- (!) 123 (!) 26 95 % --   07/08/22 1530 138/63 98 F (36.7 C) -- (!) 139 (!) 30 96 % --   07/08/22 1524 138/58 -- -- (!) 143 (!) 26 95 % --   07/08/22 1520 132/66 98 F (36.7 C) -- (!) 145 (!) 27 97 % --   07/08/22 1518 159/68 -- -- -- -- -- --  07/08/22 1515 159/68 -- -- (!) 101 (!) 25 98 % --   07/08/22 1500 146/72 98.1 F (36.7 C) -- (!) 115 (!) 31 95 % --   07/08/22 1430 137/63 -- -- (!) 110 (!) 27 94 % --   07/08/22 1420 135/62 -- -- (!) 110 (!) 31 97 % --   07/08/22 1400 105/57 -- -- (!) 110 (!) 28 96 % --   07/08/22 1332 140/64 -- -- (!) 102 (!) 26 99 % --   07/08/22 1330 140/64 -- -- (!) 106  (!) 29 96 % --   07/08/22 1312 128/74 -- -- 99 (!) 28 97 % --   07/08/22 1223 -- -- -- 97 (!) 27 96 % --   07/08/22 1206 141/65 -- -- (!) 102 (!) 30 93 % --   07/08/22 1200 -- 98.5 F (36.9 C) Axillary 100 (!) 27 92 % --   07/08/22 1130 164/71 -- -- (!) 113 (!) 32 96 % --   07/08/22 1100 141/66 -- -- (!) 113 (!) 35 95 % --   07/08/22 1030 144/67 -- -- (!) 104 (!) 27 97 % --   07/08/22 1020 123/74 -- -- (!) 102 (!) 29 96 % --   07/08/22 1000 131/62 -- -- 98 (!) 30 94 % --   07/08/22 0930 160/68 -- -- (!) 110 (!) 38 96 % --   07/08/22 0920 162/72 -- -- 95 (!) 28 98 % --   07/08/22 0900 163/83 -- -- (!) 105 (!) 32 97 % --   07/08/22 0830 124/58 -- -- (!) 101 (!) 29 95 % --   07/08/22 0815 155/67 -- -- 99 (!) 30 94 % --   07/08/22 0800 130/85 98.5 F (36.9 C) Axillary 95 (!) 26 95 % --   07/08/22 0730 120/52 -- -- 96 (!) 29 94 % --   07/08/22 0700 147/65 -- -- 95 (!) 26 97 % --   07/08/22 0600 150/69 -- -- (!) 101 (!) 31 -- --   07/08/22 0520 134/61 -- -- 95 (!) 33 96 % --   07/08/22 0500 132/62 -- -- 92 (!) 28 92 % --   07/08/22 0400 101/51 98.4 F (36.9 C) Axillary 91 (!) 30 92 % 93.2 kg (205 lb 7.5 oz)   07/08/22 0345 -- -- -- -- -- 99 % --   07/08/22 0300 145/67 -- -- 92 (!) 27 99 % --   07/08/22 0230 -- (!) 96.5 F (35.8 C) -- -- -- -- --   07/08/22 0200 104/53 -- -- 84 (!) 28 99 % --   07/08/22 0100 93/47 -- -- 83 (!) 28 96 % --   07/08/22 0000 (!) 88/44 98.8 F (37.1 C) Axillary 87 (!) 28 98 % --   07/07/22 2300 (!) 86/44 -- -- 94 (!) 37 97 % --   07/07/22 2200 (!) 84/46 -- -- 89 (!) 32 99 % --   07/07/22 2100 (!) 66/42 -- -- (!) 120 (!) 29 99 % --   07/07/22 2020 (!) 79/53 -- -- (!) 125 (!) 39 99 % --   07/07/22 2000 (!) 84/45 -- -- (!) 130 (!) 41 99 % --   07/07/22 1941 115/55 -- -- (!) 138 -- -- --   07/07/22 1940 111/51 -- -- (!) 140 (!) 47 98 % --   07/07/22 1800 105/57 -- -- (!) 113 (!) 38 96 % --     SpO2: 94 % (07/08/2022  4:30 PM)  O2 Device: Nasal cannula (07/08/2022  4:00 PM)  FiO2: 32 % (07/08/2022   4:45 AM)  O2 Flow Rate (L/min): 4 L/min (07/08/2022  4:00 PM)  Oximetry Probe Site Changed: No (07/08/2022  4:00 PM)      Weight:   Recent Weights for the past 720 hrs (Last 3 readings):   Weight   07/08/22 0400 93.2 kg (205 lb 7.5 oz)   07/07/22 0600 90.4 kg (199 lb 4.7 oz)   07/07/22 0000 90.4 kg (199 lb 4.7 oz)         Labs:   Latest Reference Range & Units 07/08/22 06:10   WBC 3.10 - 9.50 x10 3/uL 34.08 (H)   Hemoglobin 11.4 - 14.8 g/dL 7.1 (L)   Hematocrit 16.1 - 43.7 % 21.6 (L)   Glucose 70 - 100 mg/dL 096 (H)   BUN 7 - 21 mg/dL 99 (H)   Creatinine 0.4 - 1.0 mg/dL 5.6 (H)   EGFR >=04.5 WU/JWJ/1.91 m2 7.0 (L)   Phosphorus 2.3 - 4.7 mg/dL 6.2 (H)         Medications:  Current Facility-Administered Medications   Medication Dose Route Frequency    acetaminophen  650 mg Oral 4 times per day    albuterol-ipratropium  3 mL Nebulization Q6H SCH    insulin glargine  7 Units Subcutaneous QAM    insulin lispro  2-10 Units Subcutaneous Q4H SCH    meropenem  500 mg Intravenous Q24H    metoprolol tartrate        pantoprazole  40 mg Intravenous BID    thiamine  100 mg Intravenous Q24H SCH     heparin (porcine) injection 5,000 Units  Dose: 5,000 Units  Freq: Every 12 hours scheduled Route: SC  Start: 07/01/22 2100 End: 07/08/22 0943    [id number]   0943-D/C'd                pantoprazole (PROTONIX) injection 40 mg  Dose: 40 mg  Freq: Daily Route: IV  Start: 07/07/22 1800 End: 07/08/22 1148   Order specific questions:       [id number]   1148-D/C'd                  One Time Medications:  amiodarone 150 mg in dextrose IVPB (premix)  Dose: 150 mg  Freq: Once Route: IV  Last Dose: Stopped (07/08/22 1624)  Start: 07/08/22 1615 End: 07/08/22 1624    16056   16247                  metoprolol tartrate (LOPRESSOR) injection 5 mg  Dose: 5 mg  Freq: Once Route: IV  Start: 07/08/22 1600 End: 07/08/22 1533    [id number]                       Continuous IV Infusions:   amiodarone 360 mg in dextrose 4.14% 200 mL infusion  Rate: 33.3 mL/hr Dose: 1  mg/min  Freq: Continuous Route: IV  Last Dose: 1 mg/min (07/08/22 1621)  Start: 07/08/22 1615 End: 07/08/22 2220    16211   2   2220-D/C'd  [id number]               Followed by  amiodarone 360 mg in dextrose 4.14% 200 mL infusion  Rate: 16.7 mL/hr Dose: 0.5 mg/min  Freq: Continuous Route: IV  Start: 07/08/22 2221    2221  PRN Medications:  oxyCODONE (ROXICODONE) immediate release tablet 5 mg  Dose: 5 mg  Freq: Every 6 hours PRN Route: PO  PRN Reason: moderate pain  Start: 07/06/22 0911    [id number]   [phone]) [C]3   [id number]                         Doree Albee RN, BSN  UR Case Manager  [facility]  [email or web address]  Phone: [phone]  Fax: [phone]  5:13 PM  07/08/2022      UTILIZATION REVIEW CONTACT: Name:  Doree Albee, RN BSN  Utilization Review  [facility]  Address:  [address], [place], [place] [address]  NPI:   [id number]  Tax ID:  [id number]  Phone: [phone], Option 2  Fax:  [phone]    Please use fax number [phone] to provide authorization for hospital services or to request additional information.        NOTES TO REVIEWER:    This clinical review is based on/compiled from documentation provided by the treatment team within the patient's medical record.

## 2022-07-08 NOTE — Respiratory Progress Note (Signed)
Respiratory Therapy                              Patient Re-Assessment Note/Protocol Order Changes    Patient has been assessed and re-evaluated for the follow therapies:       Respiratory Orders   (From admission, onward)                 Start     Ordered    07/07/22 2000  Nebulizer treatment intermittent  Every 6 hours scheduled (RT)      Comments:   All Adult patients ordered for Respiratory Therapy, i.e., inhaled meds, secretion clearance/lung expansion or Oxygen greater than 5 liters/min will be evaluated by a Respiratory Therapist and assessed per Respiratory Therapy Patient Driven Protocol.  Initial assessment and changes made per protocol can be found in the progress note section of the patient chart.    07/07/22 1729    07/07/22 2000  Resp Re-Assess Adult (RT Use Only)  2 times daily (RT)       07/07/22 1729    Unscheduled  Suctioning  As needed      Question:  Suction Type  Answer:  Nasal Pharyngeal    07/06/22 0134                  IP Meds - Nasal and Inhaled (From admission, onward)      Start     Stop Status Route Frequency Ordered    07/07/22 1645  albuterol-ipratropium (DUO-NEB) 2.5-0.5(3) mg/3 mL nebulizer 3 mL         -- Dispensed NEBULIZATION RT - Every 6 hours scheduled 07/07/22 1607                 Current Criteria For Therapy  Secretion Clearance: Unable to clear sec with cough or suctioning     Medications: Hx of COPD, Asthma, Bronchitis or other documented RAD    Expected Outcomes  Secretion: Improved breath sound, Decreased secretions       Meds: Decreased bronchospasms    Outcomes Met  Secretion: No     Meds: No       Reassessment Recommendations  Recommendations: Continue current treatment plan    Patient's orders have been modified as follows per RT Patient Driven Protocol.    <free text>    If any questions, please contact the Respiratory Therapist assigned to this patient    Thank you

## 2022-07-08 NOTE — Progress Notes (Signed)
07/08/22 1600   Volunteer Chaplain   Visit Type Initial   Source [place] Only-Emergency Code   Present at Visit Patient;Staff/Team Members   Spiritual Care Provided to family members   Reason for Visit Emergency Code   Spiritual Care Interventions Provided silent and supportive presence   Spiritual Care Outcomes Consulted with nurse   Length of Visit 0-15 minutes   Follow-up Follow-up routine (3)

## 2022-07-08 NOTE — Progress Notes (Cosign Needed)
4 eyes in 4 hours pressure injury assessment note:      Completed with:   Unit & Time admitted:              Bony Prominences: Check appropriate box; if wound is present enter wound assessment in LDA     Occiput:                 [x]WNL  [] Wound present  Face:                     [x]WNL  [] Wound present  Ears:                      [x]WNL  [] Wound present  Spine:                    [x]WNL  [] Wound present  Shoulders:             [x]WNL  [] Wound present  Elbows:                  [x]WNL  [] Wound present  Sacrum/coccyx:     []WNL  [x] Wound present  Ischial Tuberosity:  [x]WNL  [] Wound present  Trochanter/Hip:      [x]WNL  [] Wound present  Knees:                   [x]WNL  [] Wound present  Ankles:                   [x]WNL  [] Wound present  Heels:                    [x]WNL  [] Wound present  Other pressure areas:  [] Wound location       Device related: [] Device name:         LDA completed if wound present: yes/no  Consult WOCN if necessary    Other skin related issues, ie tears, rash, etc, document in Integumentary flowsheet

## 2022-07-08 NOTE — Progress Notes (Signed)
Pt was given resp tx as order for SOB. Pt tolerated resp tx well. She will continue to be monitor accordingly.

## 2022-07-08 NOTE — Consults (Signed)
Gastroenterology  CONSULT NOTE  Gastroenterology Consult Service - [facility]  Pager ID: [phone]  Epic Chat (Group): [facility]  [address] [place], [place] [address]  Appointments: [phone]        Date Time: 07/08/22 3:34 PM  Patient Name: Shelby Barron  Consulting Provider: Ester Rink, DO      Reason for Consultation / Chief Complaint:   We are asked to see this patient for coffee ground emesis    Assessment:   # UGI bleed  - coffee grounds in oral cavity  - melena in rectal tube    # S/p cystectomy and completion nephrectomy c/b small bowel enterotomy 6/10  # S/p small bowel anastomosis, closure of mesenteric deveft, closure abdominal wall with wound VAC 6/12  # Encephalopathy/delirium  # Acute hypoxemic respiratory failure (3-4 L NC)  # [name] T/TH/Sat  # Renal cell carcinoma  # Urothelial cell carcinoma  # H/o CVA on plavix PTA      Plan:   Ddx includes stress ulcer, esophagitis +/- anastomotic bleed/ulcer  - Medical mgmt with IV PPI BID for now. Monitor clinical status.   - Patient is high risk for procedures. If able to manage medically, that would be preferred.  - Monitor H/H, transfuse as needed    Thank you for this consult.  Patient seen with Dr. Mack Guise.  Dr. Mack Guise discussed with patient's dtr. Secure chat message sent to hospitalist.    Attending Addendum:     The patient was seen personally with Irven Shelling, PA , who participated in the visit. Together we formulated the assessment and plan, then reviewed and updated it as needed. The total time spent together was 60 minutes. This included face-to-face encounter, reviewing the chart (including labs, imaging, and/or pathology), counseling and coordinating care, and documenting the assessment and plan.         History/HPI   Shelby Barron is a 82 y.o. female with a h/o COPD, CVA, DM2, renal cell carcinoma s/p partial b/l nephrectomy, [name], new urothelial cancer who presents to the hospital on 06/28/2022 for elective cystectomy  and complete nephrectomy. OR course c/b small bowel enterotomy and hemorrhagic shock. Return to OR for Abdominal wall closure 6/12. Patient is encephalopathic. History is obtained from chart and from RN. Noted to have frequent dark stools yesterday. Melena and possible small amount BRB overnight. Today, noted overt melena in rectal tube and coffee grounds in oral cavity. Hgb reduced to 6.6, WBC 34, BUN 99, Cr 5.6.       Past Medical History:     Past Medical History:   Diagnosis Date    Acid reflux     Asthma, well controlled     Cancer of kidney     Chronic lower back pain     Congestive heart disease     COPD (chronic obstructive pulmonary disease)     CVA (cerebral infarction) 01/12/2003    Diabetes     Diabetic nephropathy     Fibroids     Gout     H/O: gout     Hyperlipidemia     Hypertension     Neuropathy of hand     SOB (shortness of breath)          Past Surgical History:     Past Surgical History:   Procedure Laterality Date    APPENDECTOMY (OPEN)      CLOSURE, ENTEROTOMY, SMALL INTESTINE  06/28/2022    Procedure: CLOSURE,  ENTEROTOMY, SMALL INTESTINE;  Surgeon: Marlaine Hind, MD;  Location: [facility];  Service: General;;    CYSTECTOMY, ILEOCONDUIT N/A 06/28/2022    Procedure: CYSTECTOMY, RADICAL;  Surgeon: Neldon Newport, MD;  Location: [facility];  Service: Urology;  Laterality: N/A;    DRAIN (OTHER) N/A 05/31/2022    Procedure: DRAIN (OTHER);  Surgeon: Hope Pigeon, MD;  Location: [facility];  Service: Interventional Radiology;  Laterality: N/A;    EXPLORATORY LAPAROTOMY N/A 06/30/2022    Procedure: SMALL BOWEL ANASTOMOSIS, CLOSURE OF MESENTERIC DEFECT, CLOSURE OF ABDOMINAL WALL;  Surgeon: Marlaine Hind, MD;  Location: [facility];  Service: General;  Laterality: N/A;  **[id number]/ MD AVAIL [id number] OR AFTER 1600**    EXPLORATORY LAPAROTOMY, RESECTION SMALL BOWEL N/A 06/28/2022    Procedure: RESECTION SMALL BOWEL;  Surgeon: Marlaine Hind, MD;  [facility]: [facility];  Service: General;   Laterality: N/A;    HERNIA REPAIR      HYSTERECTOMY      LAPAROTOMY, NEPHRECTOMY Bilateral 06/28/2022    Procedure: BILATERAL OPEN RADICAL NEPHRECTOMY AND LYSIS OF ASHESION;  Surgeon: Neldon Newport, MD;  [facility]: [facility];  Service: Urology;  Laterality: Bilateral;    PARTIAL NEPHRECTOMY      tumor removed from kidney  cJanuary 2015    TUNNELED CATH PLACEMENT (PERMCATH) N/A 06/01/2022    Procedure: TUNNELED CATH PLACEMENT;  Surgeon: Suszanne Finch, MD;  Location: [facility];  Service: Interventional Radiology;  Laterality: N/A;    TURBT      WOUND VAC APPLICATION N/A 06/28/2022    Procedure: Tawni Pummel WOUND VAC APPLICATION;  Surgeon: Marlaine Hind, MD;  [facility]: [facility];  Service: General;  Laterality: N/A;    WOUND VAC APPLICATION N/A 06/30/2022    Procedure: WOUND VAC APPLICATION;  Surgeon: Marlaine Hind, MD;  [facility]: [facility];  Service: General;  Laterality: N/A;        Family History:     Family History   Problem Relation Age of Onset    Cancer Mother     Heart failure Father     Cancer Maternal Aunt     Diabetes Maternal Aunt     Cancer Paternal Aunt        Social History:     Social History     Socioeconomic History    Marital status: Married     Spouse name: Not on file    Number of children: Not on file    Years of education: Not on file    Highest education level: Not on file   Occupational History    Not on file   Tobacco Use    Smoking status: Former    Smokeless tobacco: Never   Vaping Use    Vaping status: Never Used   Substance and Sexual Activity    Alcohol use: No    Drug use: No    Sexual activity: Not on file   Other Topics Concern    Not on file   Social History Narrative    Not on file     Social Determinants of Health     Financial Resource Strain: Not on file   Food Insecurity: No Food Insecurity (05/26/2022)    Hunger Vital Sign     Worried About Running Out of Food in the Last Year: Never true     Ran Out of Food in the Last Year: Never true   Transportation Needs: No  Transportation Needs (05/26/2022)  PRAPARE - Therapist, art (Medical): No     Lack of Transportation (Non-Medical): No   Physical Activity: Not on file   Stress: Not on file   Social Connections: Unknown (02/01/2022)    Received from [facility]    Social Network     Social Network: Not on file   Intimate Partner Violence: Not At Risk (05/26/2022)    Humiliation, Afraid, Rape, and Kick questionnaire     Fear of Current or Ex-Partner: No     Emotionally Abused: No     Physically Abused: No     Sexually Abused: No   Housing Stability: Unknown (05/26/2022)    Housing Stability Vital Sign     Unable to Pay for Housing in the Last Year: No     Number of Places Lived in the Last Year: Not on file     Unstable Housing in the Last Year: No       Allergies:     Allergies   Allergen Reactions    Metrizamide Shortness Of Breath and Nausea Only    Rosuvastatin Other (See Comments)     myopathy    myopathy   myopathy    myopathy myopathy      myopathy    myopathy, myopathy    Iodinated Contrast Media Other (See Comments)     Nausea and SOB per patient   On Dialysisi    Nausea and SOB per patient    Nausea and SOB per patient      Nausea and SOB per patient   Nausea and SOB per patient    Nausea and SOB per patient      Nausea and SOB per patient Nausea and SOB per patient      Nausea and SOB per patient Nausea and SOB per patient Nausea and SOB per patient      Nausea and SOB per patient  Nausea and SOB per patient Nausea and SOB per patient    Nausea and SOB per patient   Nausea and SOB per patient    Pioglitazone      Weight gain    Sulfa Antibiotics Hives     blister       Medications:     Home Medications       Med List Status: Complete Set By: Con Memos, RN at 07/01/2022  1:26 AM              allopurinol (ZYLOPRIM) 100 MG tablet     Take 1 tablet (100 mg) by mouth daily     atorvastatin (LIPITOR) 40 MG tablet     Take 1 tablet (40 mg) by mouth daily     b complex vitamins capsule      Take 1 capsule by mouth daily     cinacalcet (SENSIPAR) 30 MG tablet     Take 1 tablet (30 mg) by mouth daily     clopidogrel (PLAVIX) 75 MG tablet     Take 1 tablet (75 mg) by mouth daily     conjugated estrogens (PREMARIN) vaginal cream     Place vaginally daily     hyoscyamine (LEVSIN) 0.125 MG tablet     Take 1 tablet (0.125 mg) by mouth every 4 (four) hours as needed for Cramping     insulin aspart (NOVOLOG) 100 UNIT/ML injection     Inject 5-10 Units into the skin as needed     Insulin Degludec Evaristo Bury)  100 UNIT/ML Solution     Inject 36 Units into the skin daily     metoprolol tartrate (LOPRESSOR) 25 MG tablet     Take 0.5 tablets (12.5 mg) by mouth daily     montelukast (SINGULAIR) 10 MG tablet     Take 1 tablet (10 mg) by mouth nightly     ondansetron (Zofran) 4 MG tablet     Take 1 tablet (4 mg) by mouth every 8 (eight) hours as needed for Nausea     oxybutynin XL (DITROPAN-XL) 5 MG 24 hr tablet     Take 1 tablet (5 mg) by mouth daily     oxyCODONE (OXY-IR) 5 MG capsule     Take 1 capsule (5 mg) by mouth every 4 (four) hours as needed     pantoprazole (PROTONIX) 40 MG tablet     Take 1 tablet (40 mg) by mouth daily     pregabalin (LYRICA) 50 MG capsule     Take 1 capsule (50 mg) by mouth Per pt has not been taking     sevelamer carbonate (RENVELA) 800 MG tablet     Take 1 tablet (800 mg) by mouth 2 (two) times daily     torsemide (DEMADEX) 100 MG tablet     Take 1 tablet (100 mg) by mouth daily     umeclidinium-vilanterol (Anoro Ellipta) 62.5-25 MCG/ACT Aerosol Pwdr, Breath Activated     Inhale 1 puff into the lungs daily     Zolpidem Tartrate (AMBIEN PO)     Take 5 mg by mouth as needed             Current Facility-Administered Medications   Medication Dose Route Frequency    acetaminophen  650 mg Oral 4 times per day    albuterol-ipratropium  3 mL Nebulization Q6H SCH    amiodarone  150 mg Intravenous Once    insulin glargine  7 Units Subcutaneous QAM    insulin lispro  2-10 Units Subcutaneous Q4H SCH     meropenem  500 mg Intravenous Q24H    metoprolol tartrate        metoprolol tartrate  25 mg Oral Once    pantoprazole  40 mg Intravenous BID    thiamine  100 mg Intravenous Q24H SCH     Current Facility-Administered Medications   Medication Dose Route Frequency Last Rate     Current Facility-Administered Medications   Medication Dose Route    sodium chloride   Intravenous    benzocaine  1 spray Mouth/Throat    dextrose  15 g of glucose Oral    Or    dextrose  12.5 g Intravenous    Or    dextrose  12.5 g Intravenous    Or    glucagon (rDNA)  1 mg Intramuscular    diphenhydrAMINE  25 mg Intravenous    labetalol  10 mg Intravenous    lidocaine 2% jelly   Each Nare    metoprolol tartrate       naloxone  0.2 mg Intravenous    oxyCODONE  5 mg Oral    phenol  1 spray Oral          Review of Systems:     Unable to obtain ROS 2/2 AMS    Physical Exam:   Visit Vitals  BP 132/66   Pulse (!) 145   Temp 98.1 F (36.7 C)   Resp (!) 27   Ht 1.575 m (5\' 2" )  Wt 93.2 kg (205 lb 7.5 oz)   SpO2 97%   BMI 37.58 kg/m       Temp (24hrs), Avg:98.2 F (36.8 C), Min:96.5 F (35.8 C), Max:98.8 F (37.1 C)      GENERAL APPEARANCE: ill appearing, does not respond to questioning.   RESPIRATORY:  Non labored respirations, On supplemental NC.  HENT:  Scleral anicteric.    ABDOMEN: Soft, non-tender, wound vac in place    Lab:     Recent Labs   Lab 07/08/22  0610 07/07/22  2120 07/07/22  0358   WBC 34.08* 31.44* 31.38*   Hemoglobin 7.1* 6.6* 7.6*   Hematocrit 21.6* 21.1* 22.8*   Platelet Count 298 309 358*       Recent Labs   Lab 07/08/22  0610 07/07/22  2120 07/07/22  0358 07/06/22  1545 07/06/22  0341 07/03/22  1412 07/03/22  0347 07/02/22  1122 07/02/22  0502   Sodium 140 134* 138  More results in Results Review 139  More results in Results Review 136  More results in Results Review 136   Potassium 4.1 4.0 3.8  More results in Results Review 4.5  More results in Results Review 4.3  More results in Results Review 4.2   Chloride 107 103  104  More results in Results Review 106  More results in Results Review 106  More results in Results Review 106   CO2 19 18 21   More results in Results Review 21  More results in Results Review 23  More results in Results Review 25   BUN 99* 94* 68*  More results in Results Review 79*  More results in Results Review 23*  More results in Results Review 14   Creatinine 5.6* 5.3* 4.2*  More results in Results Review 5.3*  More results in Results Review 1.9*  More results in Results Review 1.4*   Calcium 8.4 8.2 8.1  More results in Results Review 7.7*  More results in Results Review 10.4*  More results in Results Review 8.3   Albumin  --  1.4*  --   --  1.3*  --  1.4*  1.4*  More results in Results Review 1.5*  1.4*   Protein, Total  --  4.1*  --   --   --   --  5.1*  --  4.0*   Bilirubin, Total  --  0.1*  --   --   --   --  0.2  --  0.3   Alkaline Phosphatase  --  69  --   --   --   --  61  --  64   ALT  --  15  --   --   --   --  16  --  18   AST (SGOT)  --  23  --   --   --   --  16  --  23   Glucose 179* 277* 230*  More results in Results Review 210*  More results in Results Review 238*  More results in Results Review 211*   More results in Results Review = values in this interval not displayed.                   Radiology:     Radiology Results (24 Hour)       ** No results found for the last 24 hours. **          Radiological Procedure within last  24 hours reviewed.    Signed by: Valorie Roosevelt, PA- C  [employer] pager ID: [phone]

## 2022-07-08 NOTE — Progress Notes (Signed)
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 s/p bilateral radical nephrectomies and  cystectomy on 06/28/22  - complicated by small bowel enterotomy and blood loss   New onset afib - ICU consulted  High grade urothelial bladder CA: s/p bilateral nephrectomies and cystectomy c/b mall bowel enterotomy and blood loss (~1L) on 06/28/22  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 (approx 1L) - stable  Hypophosphatemia  - better  Encephalopathy - persists  Leukocytosis - worse despite being on abx     Recommendations:     WBC remains elevated - BCx pending  Will try HD today with UF  Would benefit from 1 unit PRBC    Discussed overall poor prognosis with her daughter    Case discussed with: daughter    Alla German, MD  [facility]  [phone] (office)  X [phone] ([facility] Spectra-Link)      Subjective:  Moves limbs but not directional, NGT in place, mittens on    Review of Systems:   Unable to assess, patient is confused     Physical Exam:     Vitals:    07/08/22 0345 07/08/22 0400 07/08/22 0500 07/08/22 0520   BP:  101/51 132/62 134/61   Pulse:  91 92 95   Resp:  (!) 30 (!) 28 (!) 33   Temp:  98.4 F (36.9 C)     TempSrc:  Axillary     SpO2: 99% 92% 92% 96%   Weight:  93.2 kg (205 lb 7.5 oz)     Height:           Intake and Output Summary (Last 24 hours) at Date Time    Intake/Output Summary (Last 24 hours) at 07/08/2022 0617  Last data filed at 07/07/2022 1900  Gross per 24 hour   Intake 517 ml   Output 800 ml   Net -283 ml       General:  confused  Cardiovascular: regular rate and rhythm  Lungs: bilateral air entry  Abdomen: soft  Extremities: edema noted in her hands    Access: R quinton, old permacath    Meds:      Scheduled Meds: PRN Meds:    acetaminophen, 650 mg, Oral, 4 times per day  albuterol-ipratropium, 3 mL, Nebulization, Q6H SCH  heparin (porcine), 5,000 Units, Subcutaneous, Q12H SCH  insulin glargine, 7 Units, Subcutaneous, QAM  insulin lispro, 2-10 Units, Subcutaneous, Q4H SCH  meropenem, 500 mg, Intravenous, Q24H  pantoprazole, 40 mg, Intravenous, Daily  thiamine, 100 mg, Intravenous, Q24H SCH          Continuous Infusions:     sodium chloride, , PRN  benzocaine, 1 spray, TID PRN  dextrose, 15 g of glucose, PRN   Or  dextrose, 12.5 g, PRN   Or  dextrose, 12.5 g, PRN   Or  glucagon (rDNA), 1 mg, PRN  diphenhydrAMINE, 25 mg, Q6H PRN  labetalol, 10 mg, Q2H PRN  lidocaine 2% jelly, ,  Once PRN  naloxone, 0.2 mg, PRN  oxyCODONE, 5 mg, Q6H PRN  phenol, 1 spray, Q2H PRN              Labs:     Recent Labs   Lab 07/07/22  2120 07/07/22  0358 07/07/22  0044   WBC 31.44* 31.38* 30.49*   Hemoglobin 6.6* 7.6* 7.3*   Hematocrit 21.1* 22.8* 22.2*   Platelet Count 309 358* 321     Recent Labs   Lab 07/07/22  2120 07/07/22  0358 07/06/22  1545 07/06/22  0341 07/05/22  0237 07/03/22  1412 07/03/22  0347   Sodium 134* 138 139 139 143  More results in Results Review 136   Potassium 4.0 3.8 3.8 4.5 4.4  More results in Results Review 4.3   Chloride 103 104 103 106 106  More results in Results Review 106   CO2 18 21 23 21 23   More results in Results Review 23   BUN 94* 68* 49* 79* 50*  More results in Results Review 23*   Creatinine 5.3* 4.2* 3.4* 5.3* 4.0*  More results in Results Review 1.9*   Calcium 8.2 8.1 7.8* 7.7* 7.6*  More results in Results Review 10.4*   Albumin 1.4*  --   --  1.3*  --   --  1.4*  1.4*   Phosphorus 6.5*  --   --  4.2 3.4  More results in Results Review 1.3*   Magnesium 2.3 2.1  --  2.3 2.2  More results in Results Review 2.4    Glucose 277* 230* 200* 210* 200*  More results in Results Review 238*   GFR 7.5* 9.9* 12.7* 7.5* 10.5*  More results in Results Review 25.6*   More results in Results Review = values in this interval not displayed.           Signed by: Alla German, MD, MD

## 2022-07-09 DIAGNOSIS — D649 Anemia, unspecified: Secondary | ICD-10-CM

## 2022-07-09 LAB — CBC
Absolute nRBC: 0.18 10*3/uL — ABNORMAL HIGH (ref <=0.00)
Absolute nRBC: 0.11 10*3/uL — ABNORMAL HIGH (ref <=0.00)
Hematocrit: 20.8 % — ABNORMAL LOW (ref 34.7–43.7)
Hematocrit: 27.3 % — ABNORMAL LOW (ref 34.7–43.7)
Hemoglobin: 6.9 g/dL — ABNORMAL LOW (ref 11.4–14.8)
Hemoglobin: 9.3 g/dL — ABNORMAL LOW (ref 11.4–14.8)
MCH: 30.9 pg (ref 25.1–33.5)
MCH: 30.3 pg (ref 25.1–33.5)
MCHC: 33.2 g/dL (ref 31.5–35.8)
MCHC: 34.1 g/dL (ref 31.5–35.8)
MCV: 93.3 fL (ref 78.0–96.0)
MCV: 88.9 fL (ref 78.0–96.0)
MPV: 10.7 fL (ref 8.9–12.5)
MPV: 10.9 fL (ref 8.9–12.5)
Platelet Count: 270 10*3/uL (ref 142–346)
Platelet Count: 248 10*3/uL (ref 142–346)
RBC: 2.23 10*6/uL — ABNORMAL LOW (ref 3.90–5.10)
RBC: 3.07 10*6/uL — ABNORMAL LOW (ref 3.90–5.10)
RDW: 15 % (ref 11–15)
RDW: 15 % (ref 11–15)
WBC: 30.24 10*3/uL — ABNORMAL HIGH (ref 3.10–9.50)
WBC: 27.83 10*3/uL — ABNORMAL HIGH (ref 3.10–9.50)
nRBC %: 0.6 /100 WBC — ABNORMAL HIGH (ref <=0.0)
nRBC %: 0.4 /100 WBC — ABNORMAL HIGH (ref <=0.0)

## 2022-07-09 LAB — LACTIC ACID: Whole Blood Lactic Acid: 1.1 mmol/L (ref 0.2–2.0)

## 2022-07-09 LAB — HEMOGLOBIN AND HEMATOCRIT
Hematocrit: 20.4 % — ABNORMAL LOW (ref 34.7–43.7)
Hemoglobin: 6.7 g/dL — ABNORMAL LOW (ref 11.4–14.8)

## 2022-07-09 LAB — BASIC METABOLIC PANEL
Anion Gap: 15 (ref 5.0–15.0)
BUN: 97 mg/dL — ABNORMAL HIGH (ref 7–21)
CO2: 20 mEq/L (ref 17–29)
Calcium: 8.5 mg/dL (ref 7.9–10.2)
Chloride: 106 mEq/L (ref 99–111)
Creatinine: 6 mg/dL — ABNORMAL HIGH (ref 0.4–1.0)
GFR: 6.4 mL/min/{1.73_m2} — ABNORMAL LOW (ref >=60.0)
Glucose: 181 mg/dL — ABNORMAL HIGH (ref 70–100)
Potassium: 4.2 mEq/L (ref 3.5–5.3)
Sodium: 141 mEq/L (ref 135–145)

## 2022-07-09 LAB — WHOLE BLOOD GLUCOSE POCT
Whole Blood Glucose POCT: 146 mg/dL — ABNORMAL HIGH (ref 70–100)
Whole Blood Glucose POCT: 154 mg/dL — ABNORMAL HIGH (ref 70–100)
Whole Blood Glucose POCT: 171 mg/dL — ABNORMAL HIGH (ref 70–100)
Whole Blood Glucose POCT: 172 mg/dL — ABNORMAL HIGH (ref 70–100)
Whole Blood Glucose POCT: 173 mg/dL — ABNORMAL HIGH (ref 70–100)
Whole Blood Glucose POCT: 179 mg/dL — ABNORMAL HIGH (ref 70–100)

## 2022-07-09 LAB — PHOSPHORUS: Phosphorus: 6.6 mg/dL — ABNORMAL HIGH (ref 2.3–4.7)

## 2022-07-09 LAB — MAGNESIUM: Magnesium: 2.2 mg/dL (ref 1.6–2.6)

## 2022-07-09 MED ORDER — SODIUM CHLORIDE 0.9 % IV BOLUS
100.0000 mL | INTRAVENOUS | Status: AC | PRN
Start: 2022-07-09 — End: 2022-07-09

## 2022-07-09 MED ORDER — METOPROLOL TARTRATE 12.5 MG PO SPLIT TAB
12.5000 mg | ORAL_TABLET | Freq: Two times a day (BID) | ORAL | Status: DC
Start: 2022-07-09 — End: 2022-07-19
  Administered 2022-07-09 – 2022-07-19 (×20): 12.5 mg via NASOGASTRIC
  Filled 2022-07-09 (×21): qty 1

## 2022-07-09 MED ORDER — SODIUM CHLORIDE 0.9 % IV BOLUS
250.0000 mL | INTRAVENOUS | Status: AC | PRN
Start: 2022-07-09 — End: 2022-07-09

## 2022-07-09 MED ORDER — ALBUMIN HUMAN 25 % IV SOLN
100.0000 mL | INTRAVENOUS | Status: AC | PRN
Start: 2022-07-09 — End: 2022-07-09

## 2022-07-09 NOTE — Progress Notes (Signed)
4 eyes in 4 hours pressure injury assessment note:      Completed with:   Unit & Time admitted:              Bony Prominences: Check appropriate box; if wound is present enter wound assessment in LDA     Occiput:                 [x]WNL  [] Wound present  Face:                     [x]WNL  [] Wound present  Ears:                      [x]WNL  [] Wound present  Spine:                    [x]WNL  [] Wound present  Shoulders:             [x]WNL  [] Wound present  Elbows:                  [x]WNL  [] Wound present  Sacrum/coccyx:     []WNL  [x] Wound present  Ischial Tuberosity:  [x]WNL  [] Wound present  Trochanter/Hip:      [x]WNL  [] Wound present  Knees:                   [x]WNL  [] Wound present  Ankles:                   [x]WNL  [] Wound present  Heels:                    [x]WNL  [] Wound present  Other pressure areas:  [] Wound location       Device related: [] Device name:         LDA completed if wound present: yes/no  Consult WOCN if necessary    Other skin related issues, ie tears, rash, etc, document in Integumentary flowsheet

## 2022-07-09 NOTE — Plan of Care (Signed)
Pt opens eyes spontaneously, withdraws to pain in RUE and RLE. Moans and groans and occasionally incomprehensible speech. CPOT 4. PRN Oxycodone x2 given. NSR/A-fib. HR 70-80's. SBP 120-130's. NC 3L. Lung sounds diminished. Abdominal dressing with wound vac. NPO. ND R nare 78 cm. Q4 BG checks. FMS. Anuric. Redness on sacrum, otherwise skin intact.   Pt has transfer orders in and currently waiting for bed placement. Possible dialysis today.     Problem: Inadequate Airway Clearance  Goal: Patent Airway maintained  Outcome: Progressing  Flowsheets (Taken 07/09/2022 2311)  Patent airway maintained:   Position patient for maximum ventilatory efficiency   Provide adequate fluid intake to liquefy secretions   Suction secretions as needed   Reposition patient every 2 hours and as needed unless able to self-reposition     Problem: Impaired Mobility  Goal: Mobility/Activity is maintained at optimal level for patient  Outcome: Progressing  Flowsheets (Taken 07/09/2022 2311)  Mobility/activity is maintained at optimal level for patient:   Encourage independent activity per ability   Consult/collaborate with Physical Therapy and/or Occupational Therapy     Problem: Constipation  Goal: Fluid and electrolyte balance are achieved/maintained  Outcome: Progressing  Flowsheets (Taken 07/09/2022 2311)  Fluid and electrolyte balance are achieved/maintained:   Monitor/assess lab values and report abnormal values   Observe for cardiac arrhythmias   Monitor for muscle weakness   Assess and reassess fluid and electrolyte status     Problem: Renal Instability  Goal: Fluid and electrolyte balance are achieved/maintained  Outcome: Progressing  Flowsheets (Taken 07/09/2022 2311)  Fluid and electrolyte balance are achieved/maintained:   Monitor/assess lab values and report abnormal values   Observe for cardiac arrhythmias   Assess and reassess fluid and electrolyte status   Monitor for muscle weakness     Problem: Fluid and Electrolyte Imbalance/  Endocrine  Goal: Fluid and electrolyte balance are achieved/maintained  Outcome: Progressing  Flowsheets (Taken 07/09/2022 2311)  Fluid and electrolyte balance are achieved/maintained:   Monitor/assess lab values and report abnormal values   Observe for cardiac arrhythmias   Monitor for muscle weakness   Assess and reassess fluid and electrolyte status     Problem: Inadequate Gas Exchange  Goal: Patent Airway maintained  Outcome: Progressing  Flowsheets (Taken 07/09/2022 2311)  Patent airway maintained:   Position patient for maximum ventilatory efficiency   Provide adequate fluid intake to liquefy secretions   Suction secretions as needed   Reposition patient every 2 hours and as needed unless able to self-reposition

## 2022-07-09 NOTE — Progress Notes (Signed)
Arrived pt room 2902, pt does not follow commands, started HD via Right IJ working well,  dressing intact , report was received from primary nurse.       07/09/22 1419   Vitals   Temp 97.7 F (36.5 C)   Heart Rate 80   Resp Rate (!) 32   BP 146/66   SpO2 99 %   Machine Metrics   $Treatment Started/Capturing Charge Yes   Blood Flow Rate (mL/min) 300 mL/min   Arterial Pressure (mmHg) -94 mmHg   Venous Pressure (mmHg) 27   Dialysate Flow Rate (mL/min) 500 mL/min   Transmembrane Pressure (mmHg) 44 mmHg   Ultrafiltration Rate (mL/Hr) 570 mL/hr   Fluid Bolus (ml) 200 ml   Dialysate K (mEq/L) 2 mEq/L   Dialysate CA (mEq/L) 2.5 mEq/L   Hemodialysis Comments   Arteriovenous Lines Secure Yes   Comments HD started, timeouts done

## 2022-07-09 NOTE — Progress Notes (Addendum)
[facility] CARDIOLOGY PROGRESS NOTE    [facility]  MED SURG ICU 1  [address] [place] [address]  Dept: [phone]  Loc: [phone]    Date Time: 07/09/22 10:47 AM  Patient Name: [facility] Day: 11    Subjective/Cardiac Relevant Events:   Patient is encephalopathic.  Worsening leukocytosis.  Paroxysmal afib and atrial tachycardia.  Worsening anemia    ASSESSMENT:   Patient is a 82 y.o. female with the following relevant diagnoses:    Paroxysmal atrial arrhythmias - AF and AT in the setting of acute medical illness and anemia.   Echocardiogram June 2024-EF 65%  Encephalopathy-does not respond to verbal interaction and nurse notes she is not responding to pain  Bladder cancer status post bilateral nephrectomies and cystectomy during this admission  History of renal cell carcinoma status post partial nephrectomy previously  End-stage renal disease now maintained on hemodialysis  Anemia  Encephalopathy  Diabetes  History of CVA      RECOMMENDATIONS:     Continue metoprolol 25mg  PO BID by NG tube as blood pressure tolerates.  Not a candidate for anticoagulation.  Supportive care through transfusion and antibiotics.  We will see PRN.    Telemetry/Labs Reviewed/Intake-Output:         Recent Labs   Lab 07/07/22  2120 07/06/22  0341 07/03/22  0347   Bilirubin, Total 0.1*  --  0.2   Bilirubin Direct  --   --  0.1   Protein, Total 4.1*  --  5.1*   Albumin 1.4*  More results in Results Review 1.4*  1.4*   ALT 15  --  16   AST (SGOT) 23  --  16   More results in Results Review = values in this interval not displayed.     Recent Labs   Lab 07/09/22  0207   Magnesium 2.2         Recent Labs   Lab 07/09/22  1308 07/09/22  0208 07/08/22  0610 07/07/22  2120   WBC  --  30.24* 34.08* 31.44*   Hemoglobin 6.7* 6.9* 7.1* 6.6*   Hematocrit 20.4* 20.8* 21.6* 21.1*   Platelet Count  --  270 298 309     Recent Labs   Lab 07/09/22  0207 07/08/22  0610 07/07/22  2120   Sodium 141 140 134*    Potassium 4.2 4.1 4.0   Chloride 106 107 103   CO2 20 19 18    BUN 97* 99* 94*   Creatinine 6.0* 5.6* 5.3*   GFR 6.4* 7.0* 7.5*   Glucose 181* 179* 277*   Calcium 8.5 8.4 8.2           Invalid input(s): "FREET4"    .  Lab Results   Component Value Date    BNP 696 (H) 05/26/2022          Intake/Output Summary (Last 24 hours) at 07/09/2022 1047  Last data filed at 07/09/2022 1000  Gross per 24 hour   Intake 787.7 ml   Output -417 ml   Net 1204.7 ml       Medications:      Scheduled Meds: PRN Meds:    acetaminophen, 650 mg, Oral, 4 times per day  albuterol-ipratropium, 3 mL, Nebulization, Q6H SCH  insulin glargine, 7 Units, Subcutaneous, QAM  insulin lispro, 2-10 Units, Subcutaneous, Q4H SCH  meropenem, 500 mg, Intravenous, Q24H  micafungin, 100 mg, Intravenous, Q24H  pantoprazole, 40 mg, Intravenous, BID  thiamine,  100 mg, Intravenous, Q24H SCH        Continuous Infusions:   amiodarone 0.5 mg/min (07/09/22 1000)    albumin human, 100 mL, PRN  benzocaine, 1 spray, TID PRN  dextrose, 15 g of glucose, PRN   Or  dextrose, 12.5 g, PRN   Or  dextrose, 12.5 g, PRN   Or  glucagon (rDNA), 1 mg, PRN  diphenhydrAMINE, 25 mg, Q6H PRN  labetalol, 10 mg, Q2H PRN  lidocaine 2% jelly, , Once PRN  naloxone, 0.2 mg, PRN  oxyCODONE, 5 mg, Q6H PRN  phenol, 1 spray, Q2H PRN  sodium chloride, 100 mL, Q1H PRN  sodium chloride, 250 mL, PRN          Physical Exam:     Vitals:    07/09/22 0743 07/09/22 0800 07/09/22 0856 07/09/22 0900   BP:  125/60 147/67 147/67   Pulse:  87  90   Resp:  (!) 36  (!) 34   Temp:  98.5 F (36.9 C)     TempSrc:  Axillary     SpO2: 100% 99%  97%   Weight:       Height:             07/03/2022 07/04/2022 07/05/2022 07/06/2022 07/07/2022 07/08/2022 07/09/2022   Weight Monitoring   Weight 93 kg 87.8 kg 87.8 kg 93.6 kg 90.4 kg    90.4 kg 93.2 kg 93.1 kg   Weight Method Bed Scale Bed Scale Bed Scale Bed Scale Bed Scale Bed Scale Bed Scale   BMI (calculated) 37.5 kg/m2 35.4 kg/m2 35.4 kg/m2 37.7 kg/m2 36.4 kg/m2    36.4 kg/m2  37.6 kg/m2 37.5 kg/m2       Multiple values from one day are sorted in reverse-chronological order       Constitutional: Encephalopathic.  Neck: Right IJ central line.  Cardiac: Regular rate and rhythm, normal S1 and S2; no S3 or S4, no murmurs, no rubs, no gallops.  Pulmonary: Rales are audible without stethoscope.  Extremities: Warm extremities.    -------------------------------------------------------------------------------------  Signed by:         Elsie Amis, MD         [facility]    [facility] Heart Contact Information   [facility]  Secure Chat (Group):   [facility] [place] [facility]    APP Spectralink:  [phone]    MD Spectralink :  [phone]  [phone]    After hours, non urgent consult line:  [phone]    After hours, physician on-call:  [phone] [facility]  Secure Chat (Group):   [facility] [place] [facility]    APP Spectralink:  [phone]    MD Spectralink :  [phone]      After hours, non urgent consult line:  [phone]    After hours, physician on-call:  [phone] [facility]  Secure Chat (Group):   [facility] [place] [facility]    APP Spectralink:  [phone]    MD Spectralink :  [phone]      After hours, non urgent consult line:  [phone]    After hours, physician on-call:  [phone] [facility]  Secure Chat (Group):   [place]  [facility]    APP Spectralink:  [phone]    MD Spectralink :  [phone]      After hours, non urgent consult line:  [phone]    After hours, physician on-call:  [phone]       This note was generated by the Dragon speech recognition and may contain errors or omissions  not intended by the user. Grammatical errors, random word insertions, deletions, pronoun errors, and incomplete sentences are occasional consequences of this technology due to software limitations. Not all errors are caught or corrected. If there are questions or concerns about the content of this note or information contained within the body of this dictation, they should be  addressed directly with the author for clarification.

## 2022-07-09 NOTE — Progress Notes (Signed)
Wound Ostomy Continence Progress Note/Follow Up    Date Time: 07/09/22 5:53 PM  Patient Name: Shelby Barron, Shelby Barron  Consulting Service: [facility] Day: 12         Assessment      Date of Operation:   06/28/2022     Providers Performing:   Surgeons and Role:  Panel 1:     * Neldon Newport, MD - Primary     * Adele Dan, MD - Resident - Assisting     * Marlaine Hind, MD - Assistant Surgeon     * Nonda Lou, MD  Panel 2:     Marlaine Hind, MD - Primary     Operative Procedure:   Panel 1  CYSTECTOMY, RADICAL: 78295 (CPT)  BILATERAL OPEN RADICAL NEPHRECTOMY AND LYSIS OF ASHESION:   Panel 2  RESECTION SMALL BOWEL:   ABTHERA WOUND VAC APPLICATION:   SEROSAL INJURY REPAIR     Wound Assessment/Interventions:     Wound Type surgical :     Location- mid abdomen  Measuremen  28_cm x _6.5_cm x 6__cm  Characteristics  WoundBed: full  Thickness tissue loss.   Granulation- 50 %  Non granulation tissue- 30 %  Slough- 20 %  Eschar-0 % -   Periwound/Edges:  macerate and denuded on lower area   Drainage: Amount small  Drainage color-  serosanguinous noted on the cannister  Drainage odor- none  Undermining: no  Tunneling: none  Pain: Yes         VAC Change:  Vac Serial Number:   Type of Foam used: black simplace and white foam              Number of pieces used: 3 black and 2 white   Contact layer applied: silver non adherent  Continuous negative pressure setting: 125 mm Hg, low, intensity     Plan      Plan/Recommendations:     Wound care to mid abdomen     Area cleansed with wound cleanser/Vashe, patted dry.    Applied Cavilon No-Sting skin barrier film to peri-wound and allowed to dry.   Placed Vac drape around peri-wound window paned style, to prevent maceration and top with Cera ring for more adherent.   Packed two pieces of silver non adherent dressing and top with  two pieces of white foam and 2 pieces of of black foam into a wound  Secured with vac drape.   A quarter size cut hole was cut into the black foam to  fit the trackpad.   Good seal obtained and wound vacuum setting at 125 mm Hg, low intensity continuous.  An aseptic technique utilized seemed to tolerate it well without any pain medication.         1.  Wound care orders entered into EMR.   2.  Care rendered.  3.  RN checks wound VAC Q 2 hr: Sponge contracted;  pump "ON" and plugged in.    4.  If unable to maintain the seal, apply Tegaderm dressing or vac drape to seal. May also contact [facility] at 1 - [phone] to troubleshoot.  5.  If still unable to maintain the seal and vac off or not working for 2 hours, remove all foam and place hydrogel moistened gauze into wound bed, cover with ABD pad secured with tape, and notify Physician and WOC team.          6.  Change two- three times a week.  When patient discharged or VAC discontinued -   Remove all foam dressing and non adherent dressing and place hydrogel moistened gauze into the wound bed, cover with ABD pad tape to secure.   Wound Vac machine - discard canister and tubing.    Place wound vac in CLEAR plastic bag and place in the soiled utility room. DO NOT USE RED BIOHAZARD BAGS.    Please contact WOC office [phone] and leave a message for WOC to pick up.           Plan:     Discharge VAC Wound Information     Prescribed [facility] VAC Therapy for duration:  4 months      Discharge VAC Wound Information  Surgical   mid abdomen  Age of wound:   Did VAC initiate at the hospital?     yes         Initiated - 06/28/22  Compromised Nutritional status? no Nutrition consulted for wound care needs.  Other therapies previously tried: no.     Is there eschar present in the wound: no  Has wound been debrided in the last ten days:   fresh surgery     Are serial debridements required: no  Location: mid abdomen  Measurements: Date- 07/09/22 see above                            Length-   cm                            Width-  cm                            Depth- cm                            Undermining- no  cm                             Tunneling-  no cm  The appearance of wound bed and color: red, pink, brown  Exudate: serosanguinous  Is wound full-thickness: yes  Is bone, tendon or muscle exposed: no  Is there undermining no  Is there tunneling no       Has the WOCN team attempted other dressings: no  WOCN assessed clinical reasons for the continuation of VAC at home:  Promotes wound healing, Speed granulation tissue, prevent infection          Initiate/ continue pressure prevention bundle:     Head of the bed 30 degrees or less  Positioning device to the bedside  Eliminate/minimize pressure from the area  Float heels with boots or pillows  Turn patient  Pressure redistribution cushion to the chair  Use lift sheets/low friction surface sheets for positioning.  Pad bony prominences  Nutrition consult/Optimize nutrition  Initiate bed algorithm/Specialty bed  Moisture/Incontinence management - Cleanse with incontinence cleansing wipe/water to manage incontinence and protect skin from exposure to urine and stool. Apply skin barrier protection cream. Apply [place]/external or female external urine management system to prevent urinary contamination of a wound. Apply rectal pouch/fecal management system per unit policy, to prevent fecal contamination of a wound or contain diarrhea.  Wound care orders entered into EMR. Care rendered. WOCN will follow.     Wound Photography:         Objective Findings   Populated from Epic Documentation:    Braden: Braden Scale Score: 12 (07/09/22 1400)  Braden Subscales:  Sensory Perceptions: Very limited (07/09/22 1400)  Moisture: Rarely moist (07/09/22 1400)  Activity: Bedfast (07/09/22 1400)  Mobility: Completely immobile (07/09/22 1400)  Nutrition: Probably inadequate (07/09/22 1400)  Friction and Shear: Potential problem (07/09/22 1400)    Ht Readings from Last 1 Encounters:   07/02/22 1.575 m (5\' 2" )     Wt Readings from Last 3 Encounters:   07/09/22 93.1 kg (205 lb 4 oz)   06/25/22 87.1 kg (192  lb)   06/23/22 84.8 kg (187 lb)     Body mass index is 37.54 kg/m.    Current Diet:   Diet NPO effective now  Supervise For Meals Frequency: All meals  Tube feeding-Continuous     History of Present Illness   This is a 82 y.o. female  has a past medical history of Acid reflux, Asthma, well controlled, Cancer of kidney, Chronic lower back pain, Congestive heart disease, COPD (chronic obstructive pulmonary disease), CVA (cerebral infarction) (01/12/2003), Diabetes, Diabetic nephropathy, Fibroids, Gout, H/O: gout, Hyperlipidemia, Hypertension, Neuropathy of hand, and SOB (shortness of breath)..  Admitted with Bladder cancer.      Past Surgical History:   Procedure Laterality Date    APPENDECTOMY (OPEN)      CLOSURE, ENTEROTOMY, SMALL INTESTINE  06/28/2022    Procedure: CLOSURE, ENTEROTOMY, SMALL INTESTINE;  Surgeon: Marlaine Hind, MD;  Location: [facility];  Service: General;;    CYSTECTOMY, ILEOCONDUIT N/A 06/28/2022    Procedure: CYSTECTOMY, RADICAL;  Surgeon: Neldon Newport, MD;  Location: [facility];  Service: Urology;  Laterality: N/A;    DRAIN (OTHER) N/A 05/31/2022    Procedure: DRAIN (OTHER);  Surgeon: Hope Pigeon, MD;  Location: [facility];  Service: Interventional Radiology;  Laterality: N/A;    EXPLORATORY LAPAROTOMY N/A 06/30/2022    Procedure: SMALL BOWEL ANASTOMOSIS, CLOSURE OF MESENTERIC DEFECT, CLOSURE OF ABDOMINAL WALL;  Surgeon: Marlaine Hind, MD;  Location: [facility];  Service: General;  Laterality: N/A;  **IP-2910.01/ MD AVAIL [id number] OR AFTER 1600**    EXPLORATORY LAPAROTOMY, RESECTION SMALL BOWEL N/A 06/28/2022    Procedure: RESECTION SMALL BOWEL;  Surgeon: Marlaine Hind, MD;  Location: [facility];  Service: General;  Laterality: N/A;    HERNIA REPAIR      HYSTERECTOMY      LAPAROTOMY, NEPHRECTOMY Bilateral 06/28/2022    Procedure: BILATERAL OPEN RADICAL NEPHRECTOMY AND LYSIS OF ASHESION;  Surgeon: Neldon Newport, MD;  Location: [facility];  Service: Urology;  Laterality:  Bilateral;    PARTIAL NEPHRECTOMY      tumor removed from kidney  cJanuary 2015    TUNNELED CATH PLACEMENT (PERMCATH) N/A 06/01/2022    Procedure: TUNNELED CATH PLACEMENT;  Surgeon: Suszanne Finch, MD;  Location: [facility];  Service: Interventional Radiology;  Laterality: N/A;    TURBT      WOUND VAC APPLICATION N/A 06/28/2022    Procedure: Tawni Pummel WOUND VAC APPLICATION;  Surgeon: Marlaine Hind, MD;  [facility]: [facility];  Service: General;  Laterality: N/A;    WOUND VAC APPLICATION N/A 06/30/2022    Procedure: WOUND VAC APPLICATION;  Surgeon: Marlaine Hind, MD;  Location: [facility];  Service: General;  Laterality: N/A;       Procedure(s) with  comments:  SMALL BOWEL ANASTOMOSIS, CLOSURE OF MESENTERIC DEFECT, CLOSURE OF ABDOMINAL WALL - **IP-2910.01/ MD AVAIL [id number] OR AFTER 1600**  WOUND VAC APPLICATION    11 Days Post-Op  -------------------  **Canceled**    Procedure(s) with comments:  SMALL BOWEL ANASTOMOSIS, CLOSURE OF MESENTERIC DEFECT, CLOSURE OF ABDOMINAL WALL - **IP-2910.01/ MD AVAIL [id number] OR AFTER 1600**  WOUND VAC APPLICATION    * No surgery date entered *  -------------------    Procedure(s) with comments:  SMALL BOWEL ANASTOMOSIS, CLOSURE OF MESENTERIC DEFECT, CLOSURE OF ABDOMINAL WALL - **IP-2910.01/ MD AVAIL [id number] OR AFTER 1600**  WOUND VAC APPLICATION    9 Days Post-Op  -------------------      Lab   Significant Lab Values:  Recent Labs     07/09/22  0638 07/09/22  0208 07/09/22  0208 07/09/22  0207   WBC  --   --  30.24*  --    RBC  --   --  2.23*  --    Hemoglobin 6.7*   < > 6.9*  --    Hematocrit 20.4*   < > 20.8*  --    Sodium  --   --   --  141   Potassium  --   --   --  4.2   Chloride  --   --   --  106   CO2  --   --   --  20   BUN  --   --   --  97*   Creatinine  --   --   --  6.0*   Calcium  --   --   --  8.5   GFR  --   --   --  6.4*   Glucose  --   --   --  181*    < > = values in this interval not displayed.           Tyrus Wilms "Sunshine" Iysha Mishkin BSN, RN, WOCN  Wound, Ostomy,  and Continence Nurse Coordinator  [facility]  [address] [place], [place] [address]  T [phone] S [phone]  [email or web address]

## 2022-07-09 NOTE — Progress Notes (Signed)
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 s/p bilateral radical nephrectomies and  cystectomy on 06/28/22  - complicated by small bowel enterotomy and blood loss   New onset afib - cardiology following   High grade urothelial bladder CA: s/p bilateral nephrectomies and cystectomy c/b mall bowel enterotomy and blood loss (~1L) on 06/28/22  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 (approx 1L) - stable  Hypophosphatemia  - better  Encephalopathy - persists  Leukocytosis - slowly improving - neg blood cx    Recommendations:     Presently being dialyzed aiming for 2L net UF  If able to complete entire HD session today, would plan for HD on Monday, otherwise tomorrow  Recommend transfusing for hgb<7    Dialysis Treatment Data:  Blood flow rate: 350 mL/min  Dialysate flow rate: 500 mL/min  Ultrafiltration rate: 400 mL/hr    Dialysate potassium: 2 mEq/L  Dialysate calcium: 2.5 mEq/L    Arterial pressure: -139 mmHg  Venous pressure:     Case discussed with: HD RN    Delice Bison, MD  [facility]  [phone] (office)  X [phone] (FF[facility] Spectra-Link)      Subjective:seen during dialysis, hemodynamically stable    Review of Systems:   Unable to assess, patient is confused     Physical Exam:     Vitals:    07/09/22 1450 07/09/22 1500 07/09/22 1507 07/09/22 1515   BP: 115/53 110/56 110/56 104/55   Pulse: 83 84 86 85   Resp: (!) 30 (!) 41 (!) 35 (!) 25   Temp: 97.7 F (36.5 C)  98.4 F (36.9 C)    TempSrc:   Axillary    SpO2: 98% 99% 99%  99%   Weight:       Height:           Intake and Output Summary (Last 24 hours) at Date Time    Intake/Output Summary (Last 24 hours) at 07/09/2022 1527  Last data filed at 07/09/2022 1507  Gross per 24 hour   Intake 1208.67 ml   Output -417 ml   Net 1625.67 ml       General: confused  Cardiovascular: regular rate and rhythm  Lungs: bilateral air entry  Abdomen: soft  Extremities: edema noted in her hands    Access: R quinton, old permacath    Meds:      Scheduled Meds: PRN Meds:    acetaminophen, 650 mg, Oral, 4 times per day  albuterol-ipratropium, 3 mL, Nebulization, Q6H SCH  insulin glargine, 7 Units, Subcutaneous, QAM  insulin lispro, 2-10 Units, Subcutaneous, Q4H SCH  meropenem, 500 mg, Intravenous, Q24H  metoprolol tartrate, 12.5 mg, per NG tube, Q12H SCH  micafungin, 100 mg, Intravenous, Q24H  pantoprazole, 40 mg, Intravenous, BID  thiamine, 100 mg, Intravenous, Q24H SCH          Continuous Infusions:   amiodarone 0.5 mg/min (07/09/22 1400)  benzocaine, 1 spray, TID PRN  dextrose, 15 g of glucose, PRN   Or  dextrose, 12.5 g, PRN   Or  dextrose, 12.5 g, PRN   Or  glucagon (rDNA), 1 mg, PRN  diphenhydrAMINE, 25 mg, Q6H PRN  labetalol, 10 mg, Q2H PRN  lidocaine 2% jelly, , Once PRN  naloxone, 0.2 mg, PRN  oxyCODONE, 5 mg, Q6H PRN  phenol, 1 spray, Q2H PRN              Labs:     Recent Labs   Lab 07/09/22  0638 07/09/22  0208 07/08/22  0610 07/07/22  2120   WBC  --  30.24* 34.08* 31.44*   Hemoglobin 6.7* 6.9* 7.1* 6.6*   Hematocrit 20.4* 20.8* 21.6* 21.1*   Platelet Count  --  270 298 309     Recent Labs   Lab 07/09/22  0207 07/08/22  0610 07/07/22  2120 07/06/22  1545 07/06/22  0341 07/03/22  1412 07/03/22  0347   Sodium 141 140 134*  More results in Results Review 139  More results in Results Review 136   Potassium 4.2 4.1 4.0  More results in Results Review 4.5  More results in Results Review 4.3   Chloride 106 107 103  More results in Results Review 106  More results in Results Review 106   CO2 20 19 18    More results in Results Review 21  More results in Results Review 23   BUN 97* 99* 94*  More results in Results Review 79*  More results in Results Review 23*   Creatinine 6.0* 5.6* 5.3*  More results in Results Review 5.3*  More results in Results Review 1.9*   Calcium 8.5 8.4 8.2  More results in Results Review 7.7*  More results in Results Review 10.4*   Albumin  --   --  1.4*  --  1.3*  --  1.4*  1.4*   Phosphorus 6.6* 6.2* 6.5*  --  4.2  More results in Results Review 1.3*   Magnesium 2.2 2.2 2.3  More results in Results Review 2.3  More results in Results Review 2.4   Glucose 181* 179* 277*  More results in Results Review 210*  More results in Results Review 238*   GFR 6.4* 7.0* 7.5*  More results in Results Review 7.5*  More results in Results Review 25.6*   More results in Results Review = values in this interval not displayed.           Signed by: Delice Bison, MD, MD

## 2022-07-09 NOTE — Consults (Signed)
Office: [phone]  Epic GroupChat: "[facility])"     Date Time: 07/09/22 10:15 PM  Patient Name: Shelby Barron, Shelby Barron  Requesting Physician: Ester Rink, DO     Reason for Consultation:     Leukocytosis  Resp failure  Shock    Problem List:   Acute Problem List:     Admitted 6/10 for scheduled cystectomy and completion nephrectomy    RCC s/p b/l nephrectomy, Newly diagnosed urothelial CA  - Prior RCC s/p partial b/l nephrectomy  - ESRD on PD previously -> HD 05/2022  - 05/2022 newly diagnosed urothelial Ca  - Admitted 6/10 for planned cystectomy, completion nephrectomy    Small bowel enterotomy  - Complication of her 6/10 procedure, repaired by gen surg  - Left in discontinuity given significant bleeding  - 6/12 RTOR for re-anastomosis, closure. Wound vac in place.     Leukocytosis, abdominal fluid collections, ?PNA  - Worsening WBCs since 6/15  - CT- 6/18 - post-op changes, small ascites, small collection of fluid/gas anterior midline abdomen, may be post-op, but superimposed infection cannot be ruled out  - Sputum Cx GNRs    ID History:   - Above    Chronic Conditions:  - COPD  - CVA 2004  - T2DM  - RCC s/p partial b/l nephrectomy  - [name]  - Urothelial cancer    Allergies:  - Hives with sulfa    Antimicrobials:   #11 abx    #4 mero  #2 mica    Prior  #5 cefepime  #5 flagyl  #4 zosyn    Estimated Creatinine Clearance: 7.5 mL/min (A) (based on SCr of 6 mg/dL (H)).    Assessment:     AALIYHA MUMFORD is a 82 y.o. female with PMH RCC s/p partial b/l nephrectomy and more recent urothelial CA (05/2022) admitted for planned cystectomy/completion nephrectomy, c/b enterotomy now s/p repair and closure. We are consulted for rising leukocytosis.    Source is either pulmonary in setting of GNRs in her sputum vs abdominal, given her extensive surgery and abdominal collections on imaging, however these are small anterior midline collections without enhancement so may be post-op uninfected  collections.     WBCs began to trend down after initiation of mero. So will continue this for now and monitor her sputum (GNR) and blood (NGTD) cx.     Recommendations:     - Continue meropenem  - Follow-up sputum cx speciation  - Follow-up bcx  - Can likely de-escalate micafungin in coming days  ______________________________________________________________________  I have discussed my thoughts with the patient and with relevant medical team members I have considered the potential drug interactions between antimicrobial agents   I have recommended and other medications required by the patient and adjusted appropriately for renal function   Thank you for allowing me to participate in the care of this very interesting and pleasant patient    History:   Shelby Barron is a 82 y.o. female with PMH RCC s/p partial b/l nephrectomy and more recent urothelial CA (05/2022) admitted for planned cystectomy/completion nephrectomy, c/b enterotomy now s/p repair and closure. We are consulted for rising leukocytosis.    Admitted 6/10 for scheduled cystectomy and completion nephrectomy    RCC s/p b/l nephrectomy, Newly diagnosed urothelial CA  - Prior RCC s/p partial b/l nephrectomy  - ESRD on PD previously -> HD 05/2022  - 05/2022 newly diagnosed urothelial Ca  - Admitted 6/10 for planned cystectomy,  completion nephrectomy    Small bowel enterotomy  - Complication of her 6/10 procedure, repaired by gen surg  - Left in discontinuity given significant bleeding  - 6/12 RTOR for re-anastomosis, closure. Wound vac in place.     Leukocytosis, abdominal fluid collections, ?PNA  - Worsening WBCs since 6/15  - CT- 6/18 - post-op changes, small ascites, small collection of fluid/gas anterior midline abdomen, may be post-op, but superimposed infection cannot be ruled out  - Sputum Cx GNRs    ID History:   - Above    Chronic Conditions:  - COPD  - CVA 2004  - T2DM  - RCC s/p partial b/l nephrectomy  - [name]  - Urothelial  cancer    Allergies:  - Hives with sulfa    Lines:     Patient Lines/Drains/Airways Status       Active PICC Line / CVC Line / PIV Line / Drain / Airway / Intraosseous Line / Epidural Line / ART Line / Line / Wound / Pressure Ulcer / NG/OG Tube       Name Placement date Placement time Site Days    Temporary Catheter with Pigtail 06/30/22 Temporary Catheter Right 06/30/22  1700  --  9    Permacath Catheter - Tunneled 06/01/22 Internal Jugular Right 06/01/22  0956  -- 38    Peripheral IV 07/01/22 22 G Standard Anterior;Left Forearm 07/01/22  1300  Forearm  8    Midline IV 07/01/22 Anterior;Left Upper Arm 07/01/22  1315  Upper Arm  8    Rectal Tube Without balloon 07/07/22  1400  --  2    Peritoneal Dialysis Catheter Mid lower abdomen 05/26/22  2000  -- 44    Wound 06/28/22 Surgical Incision Abdomen Left GAUZE 4 X 4  10/PACK   06/28/22  0916  Abdomen  11    Wound 06/30/22 Surgical Incision Abdomen Other (Comment) WOUND VAC applied 06/30/22  1152  Abdomen  9    Feeding Tube ND 12 Fr. Right nare 07/05/22  1130  Right nare  4                    *I have performed a risk-benefit analysis and the patient needs a central line for access and IV medications      Review of Systems:   General ROS: negative for - chills, fevers, night sweats, weight loss   HEENT: negative for - blurry vision, sore throat, thrush   Respiratory ROS: negative for cough, SOB  Cardiovascular ROS: negative for - chest pain, palpitations   Gastrointestinal ROS: negative for - abdominal pain, nausea, vomiting, diarrhea  Genito-Urinary ROS: negative for - dysuria, urinary frequency/urgency   Musculoskeletal ROS: negative for - joint pain, joint stiffness or muscle pain   Dermatological ROS: negative for - rash and skin lesion changes   Neurological ROS: negative for - confusion, headache, dizziness  Hematological ROS: negative for - bruising, bleeding   Psychological ROS: negative for - changes in mood      Physical Exam:     Vitals:    07/09/22 2009   BP:  119/56   Pulse: 79   Resp:    Temp:    SpO2:        General Appearance: elderly, ill appearing  Neuro: unresponsive  HEENT: Nasal canula. HD cath  Lungs: diminished breath sounds anteriorly  Cardiac: tachycardic, regular  Abdomen: wound vac in place, midline abdominal incision  Extremities: no pedal edema  Skin: no rash      Past Medical History:     Past Medical History:   Diagnosis Date    Acid reflux     Asthma, well controlled     Cancer of kidney     Chronic lower back pain     Congestive heart disease     COPD (chronic obstructive pulmonary disease)     CVA (cerebral infarction) 01/12/2003    Diabetes     Diabetic nephropathy     Fibroids     Gout     H/O: gout     Hyperlipidemia     Hypertension     Neuropathy of hand     SOB (shortness of breath)        Past Surgical History:     Past Surgical History:   Procedure Laterality Date    APPENDECTOMY (OPEN)      CLOSURE, ENTEROTOMY, SMALL INTESTINE  06/28/2022    Procedure: CLOSURE, ENTEROTOMY, SMALL INTESTINE;  Surgeon: Marlaine Hind, MD;  Location: [facility];  Service: General;;    CYSTECTOMY, ILEOCONDUIT N/A 06/28/2022    Procedure: CYSTECTOMY, RADICAL;  Surgeon: Neldon Newport, MD;  Location: [facility];  Service: Urology;  Laterality: N/A;    DRAIN (OTHER) N/A 05/31/2022    Procedure: DRAIN (OTHER);  Surgeon: Hope Pigeon, MD;  Location: [facility];  Service: Interventional Radiology;  Laterality: N/A;    EXPLORATORY LAPAROTOMY N/A 06/30/2022    Procedure: SMALL BOWEL ANASTOMOSIS, CLOSURE OF MESENTERIC DEFECT, CLOSURE OF ABDOMINAL WALL;  Surgeon: Marlaine Hind, MD;  Location: [facility];  Service: General;  Laterality: N/A;  **[id number]/ MD AVAIL [id number] OR AFTER 1600**    EXPLORATORY LAPAROTOMY, RESECTION SMALL BOWEL N/A 06/28/2022    Procedure: RESECTION SMALL BOWEL;  Surgeon: Marlaine Hind, MD;  Location: [facility];  Service: General;  Laterality: N/A;    HERNIA REPAIR      HYSTERECTOMY      LAPAROTOMY, NEPHRECTOMY Bilateral 06/28/2022     Procedure: BILATERAL OPEN RADICAL NEPHRECTOMY AND LYSIS OF ASHESION;  Surgeon: Neldon Newport, MD;  Location: [facility];  Service: Urology;  Laterality: Bilateral;    PARTIAL NEPHRECTOMY      tumor removed from kidney  cJanuary 2015    TUNNELED CATH PLACEMENT (PERMCATH) N/A 06/01/2022    Procedure: TUNNELED CATH PLACEMENT;  Surgeon: Suszanne Finch, MD;  Location: [facility];  Service: Interventional Radiology;  Laterality: N/A;    TURBT      WOUND VAC APPLICATION N/A 06/28/2022    Procedure: Tawni Pummel WOUND VAC APPLICATION;  Surgeon: Marlaine Hind, MD;  Location: [facility];  Service: General;  Laterality: N/A;    WOUND VAC APPLICATION N/A 06/30/2022    Procedure: WOUND VAC APPLICATION;  Surgeon: Marlaine Hind, MD;  [facility]: [facility];  Service: General;  Laterality: N/A;       Family History:     Family History   Problem Relation Age of Onset    Cancer Mother     Heart failure Father     Cancer Maternal Aunt     Diabetes Maternal Aunt     Cancer Paternal Aunt        Social History:     Social History     Socioeconomic History    Marital status: Married     Spouse name: Not on file    Number of children: Not on file    Years of education: Not on file  Highest education level: Not on file   Occupational History    Not on file   Tobacco Use    Smoking status: Former    Smokeless tobacco: Never   Vaping Use    Vaping status: Never Used   Substance and Sexual Activity    Alcohol use: No    Drug use: No    Sexual activity: Not on file   Other Topics Concern    Not on file   Social History Narrative    Not on file     Social Determinants of Health     Financial Resource Strain: Not on file   Food Insecurity: No Food Insecurity (05/26/2022)    Hunger Vital Sign     Worried About Running Out of Food in the Last Year: Never true     Ran Out of Food in the Last Year: Never true   Transportation Needs: No Transportation Needs (05/26/2022)    PRAPARE - Therapist, art (Medical): No     Lack of  Transportation (Non-Medical): No   Physical Activity: Not on file   Stress: Not on file   Social Connections: Unknown (02/01/2022)    [facility]    Social Network     Social Network: Not on file   Intimate Partner Violence: Not At Risk (05/26/2022)    Humiliation, Afraid, Rape, and Kick questionnaire     Fear of Current or Ex-Partner: No     Emotionally Abused: No     Physically Abused: No     Sexually Abused: No   Housing Stability: Unknown (05/26/2022)    Housing Stability Vital Sign     Unable to Pay for Housing in the Last Year: No     Number of Places Lived in the Last Year: Not on file     Unstable Housing in the Last Year: No       Allergies:     Allergies   Allergen Reactions    Metrizamide Shortness Of Breath and Nausea Only    Rosuvastatin Other (See Comments)     myopathy    myopathy   myopathy    myopathy myopathy      myopathy    myopathy, myopathy    Iodinated Contrast Media Other (See Comments)     Nausea and SOB per patient   On Dialysisi    Nausea and SOB per patient    Nausea and SOB per patient      Nausea and SOB per patient   Nausea and SOB per patient    Nausea and SOB per patient      Nausea and SOB per patient Nausea and SOB per patient      Nausea and SOB per patient Nausea and SOB per patient Nausea and SOB per patient      Nausea and SOB per patient  Nausea and SOB per patient Nausea and SOB per patient    Nausea and SOB per patient   Nausea and SOB per patient    Pioglitazone      Weight gain    Sulfa Antibiotics Hives     blister         Medications:     Current Facility-Administered Medications   Medication Dose Route Frequency    acetaminophen  650 mg Oral 4 times per day    albuterol-ipratropium  3 mL Nebulization Q6H [facility]    insulin glargine  7 Units Subcutaneous QAM  insulin lispro  2-10 Units Subcutaneous Q4H [facility]    meropenem  500 mg Intravenous Q24H    metoprolol tartrate  12.5 mg per NG tube Q12H [facility]    micafungin  100 mg Intravenous Q24H    pantoprazole  40 mg  Intravenous BID    thiamine  100 mg Intravenous Q24H [facility]       Labs:     Lab Results   Component Value Date    WBC 27.83 (H) 07/09/2022    HGB 9.3 (L) 07/09/2022    HCT 27.3 (L) 07/09/2022    MCV 88.9 07/09/2022    PLT 248 07/09/2022     Lab Results   Component Value Date    CREAT 6.0 (H) 07/09/2022     Lab Results   Component Value Date    ALT 15 07/07/2022    AST 23 07/07/2022    ALKPHOS 69 07/07/2022    BILITOTAL 0.1 (L) 07/07/2022     Lab Results   Component Value Date    LACTATE 1.1 07/09/2022    LACTATE 2.0 05/26/2022       Microbiology:     Microbiology Results (last 15 days)       Procedure Component Value Units Date/Time    Culture, Blood, Aerobic And Anaerobic [[id number]] Collected: 07/07/22 0355    Order Status: Completed Specimen: Blood, Venous Updated: 07/09/22 0700     Culture Blood No growth at 2 days    Culture, Blood, Aerobic And Anaerobic [[id number]] Collected: 07/06/22 1837    Order Status: Completed Specimen: Blood, Venous Updated: 07/09/22 0000     Culture Blood No growth at 2 days    Culture, Sputum and Lower Respiratory [[id number]]     Order Status: Sent Specimen: Sputum, Induced     Culture, Sputum and Lower Respiratory [[id number]]  (Abnormal) Collected: 07/04/22 1034    Order Status: Completed Specimen: Sputum, Suctioned Updated: 07/07/22 1529     Culture Respiratory Heavy growth of Mixed upper respiratory flora      Light growth of Gram negative rod     Comment: Questionable significance due to low quantity.  No further work.        Gram Stain Few WBCs      Rare Squamous epithelial cells      Few Mixed respiratory flora    Rectal Swab Carbapenemase Surveillance, PCR [[id number]]  (Normal) Collected: 06/29/22 1842    Order Status: Completed Specimen: Swab from Rectum Updated: 06/30/22 0150     IMP gene sequence Not Detected     VIM gene sequence Not Detected     NDM gene sequence Not Detected     KPC gene sequence Not Detected     OXA-48 gene sequence Not Detected    Narrative:      This test  utilizes real-time PCR for the detection and differentiation of gene sequences associated with carbapenem-non-susceptibility in Enterobacterales (Enterobacteriaceae), Pseudomonas aeruginosa and Acinetobacter baumannii. Targets include blaKPC, blaNDM, blaVIM, blaOXA-48 (and blaOXA-181), and blaIMP (excluding IMP-7, IMP-13, IMP-14). Detection of these gene sequences does not indicate the presence of viable organisms. This assay does not report variants of these gene sequences. Mutations or polymorphisms may affect detection of current, new, or unknown variants, resulting in a false negative result. This test is for infection prevention surveillance purposes only and should not be used to make treatment decisions. The performance of the Xpert Carba-R Assay has not been evaluated with rectal swab specimens from pediatric patients.  Rads:   No results found.    Signed by: Armen Pickup, MD

## 2022-07-09 NOTE — Plan of Care (Signed)
HD ongoing  Problem: Renal Instability  Goal: Fluid and electrolyte balance are achieved/maintained  Flowsheets (Taken 07/09/2022 1423)  Fluid and electrolyte balance are achieved/maintained:   Monitor/assess lab values and report abnormal values   Observe for cardiac arrhythmias     Problem: Patient Receiving Advanced Renal Therapies  Goal: Therapy access site remains intact  Flowsheets (Taken 07/09/2022 1423)  Therapy access site remains intact:   Assess therapy access site   Change therapy access site dressing as needed

## 2022-07-09 NOTE — Progress Notes (Signed)
07/09/22 1350   Bedside Nurse Communication   Name of bedside RN - pre dialysis Constance Goltz, RN   Treatment Initiation- With Dialysis Precautions   Time Out/Safety Check Completed Yes   Consent for HD signed for this hospitalization (Date) 06/01/22   Consent for HD signed for this hospitalization (Time) 1516   Preferred language No   Blood Consent Verified N/A   Dialysis Precautions All Connections Secured   Dialysis Treatment Type Routine   Is patient diabetic? Yes   RO/Hemodialysis Cabin crew   Is Total Chlorine less than 0.1 ppm? Yes   Orignial Total Chlorine Testing Time 1400   RO/Hemodialysis Arts development officer Number 2    Machine Serial Number [id number]   RO # 21   RO Serial # [id number]   Water Hardness NA   pH 7.2   Pressure Test Verified Yes   Alarms Verified Passed   Machine Temperature 98.6 F (37 C)   Alarms Verified Yes   Na+ mEq (Machine) 138 mEq   Bicarb mEq (Machine) 35 mEq   Hemodialysis Conductivity (Machine) 13.7   Hemodialysis Conductivity (Meter) 13.5   Dialyzer Lot Number [id number]   Tubing Lot Number [id number]   RO Machine Log Completed Yes   Hepatitis Status   HBsAg (Antigen) Result Negative   HBsAg Date Drawn 07/03/22   Vitals   Temp 97.7 F (36.5 C)   Heart Rate 80   Resp Rate (!) 32   BP 147/65   SpO2 99 %   Assessment   Mental Status Other (Comment)  (does not follow command)   Cardiac (WDL) X   Cardiac Regularity Irregular   Cardiac Symptoms Return to [facility]   Cardiac Rhythm Normal sinus rhythm   Respiratory  WDL   Bilateral Breath Sounds Diminished   R Breath Sounds Diminished   L Breath Sounds Diminished   Cough Congested   Edema  WDL   Generalized Edema +2   General Skin Color Appropriate for ethnicity   Skin Condition/Temp Cool   Gastrointestinal (WDL) X   Abdomen Inspection Distended   GI Conditions Diarrhea   Mobility Bed   Temporary Catheter with Pigtail 06/30/22 Temporary Catheter Right   Placement Date/Time: 06/30/22 1700   Access Type: Temporary Catheter   Orientation: Right  Central Line Infection Prevention Education provided?: Yes  Hand Hygiene: Chlorhexidine;Alcohol based hand scrub  Line cart used?: Yes  Line kit used?: Yes  Maxi...   Line necessity reviewed? Apheresis/hemodialysis   Site Assessment Clean;Dry;Intact   Catheter Lumen Volume Venous 1.2 mL   Catheter Lumen Volume Arterial 1.2 mL   Distal Lumen Status Infusing   Tego/Curos Caps on Catheter Yes   NEW Tego/Curos Caps placed (Date) 07/09/22   APHERESIS ONLY:  Access  Red   APHERESIS ONLY: Return Blue   End Caps Free From Blood Yes   Line Care Connections checked and tightened   Dressing Type Transparent;Biopatch   Dressing Status Clean;Dry;Intact   Dressing/Line Intervention Caps changed   Line Used For Blood Draw No   Dressing Change Due 07/14/22   Pain Assessment   Charting Type Assessment   Pain Scale Used CPOT   Numeric Pain Scale   Pain Score 0   POSS Score 1  (awake but does not follow command)   Hemodialysis Comments   Pre-Hemodialysis Comments safety check done, timeotus done

## 2022-07-09 NOTE — Progress Notes (Signed)
Gastroenterology  PROGRESS NOTE  Gastroenterology Consult Service - [facility]  Pager ID: [phone]  Epic Chat (Group): [facility]  [address] [place], [place] [address]  Appointments: [phone]    Date Time:    07/09/22    8:27 AM  Patient Name: Shelby Barron    LOS: 11 days     Assessment and Recommendations:   Shelby Barron is a 82 y.o. female with PMH of COPD, CVA, DM2, renal cell carcinoma s/p partial b/l nephrectomy, [name], new urothelial cancer who is admitted for for elective cystectomy and complete nephrectomy. OR course c/b small bowel enterotomy and hemorrhagic shock. Return to OR for Abdominal wall closure 6/12. Consulted for coffee ground emesis.    # UGI bleed  - coffee grounds in oral cavity, improved  - melena in rectal tube, improved    # Acute blood loss anemia  - Hgb 6.7 s/p 1 unit yesterday     # S/p cystectomy and completion nephrectomy c/b small bowel enterotomy 6/10  # S/p small bowel anastomosis, closure of mesenteric deveft, closure abdominal wall with wound VAC 6/12  # Encephalopathy/delirium  # Acute hypoxemic respiratory failure (3-4 L NC)  # [name] T/TH/Sat  # Renal cell carcinoma  # Urothelial cell carcinoma  # H/o CVA on plavix PTA    RECOMMENDATIONS:  Ddx includes stress ulcer, esophagitis +/- anastomotic bleed/ulcer  - Discussed with surgical team. Melena likely 2/2 anastomosis. Coffee grounds in oral cavity is likely due to stress ulcer vs esophagitis. Will continue with medical mgmt unless with ongoing/significant GI bleeding. Monitor clinical status.   - Patient is high risk for procedures. If able to manage medically, that would be preferred.  - Continue IV PPI BID  - Monitor H/H, transfuse as needed  - Diet per surgery    Case has been reviewed and discussed with the GI attending, Dr. Kenney Houseman, MD, and plan of care formulated together.    Please call if clinical status changes and urgent need for GI.  Subjective   Interval History/Subjective:  Minimal dried  blood found in oral cavity. No significant overt GI bleeding overnight.     Review of Systems:     Pertinent positives and negatives noted in Interval History/Subjective.    Physical Exam:   Patient Vitals for the past 24 hrs:   BP Temp Temp src Pulse Resp SpO2 Weight   07/09/22 0800 125/60 -- -- 87 (!) 36 99 % --   07/09/22 0743 -- -- -- -- -- 100 % --   07/09/22 0730 132/62 -- -- 86 (!) 50 98 % --   07/09/22 0720 132/62 -- -- -- -- -- --   07/09/22 0700 -- -- -- 88 (!) 52 98 % --   07/09/22 0400 134/63 98.4 F (36.9 C) Axillary (!) 120 (!) 54 99 % 93.1 kg (205 lb 4 oz)   07/09/22 0233 -- -- -- (!) 103 (!) 46 95 % --   07/09/22 0203 -- -- -- 93 (!) 50 99 % --   07/09/22 0000 140/60 98.6 F (37 C) Axillary 85 (!) 37 100 % --   07/08/22 2221 151/65 -- -- -- -- -- --   07/08/22 2128 -- -- -- 86 (!) 50 97 % --   07/08/22 2058 159/69 -- -- 91 (!) 36 95 % --   07/08/22 2000 159/69 98.3 F (36.8 C) Axillary 89 (!) 49 97 % --   07/08/22 1935 -- -- --  89 (!) 52 99 % --   07/08/22 1845 107/51 -- -- 86 (!) 31 96 % --   07/08/22 1830 109/51 -- -- 84 (!) 25 95 % --   07/08/22 1815 120/56 -- -- 87 (!) 28 95 % --   07/08/22 1800 128/58 -- -- (!) 125 (!) 26 96 % --   07/08/22 1745 133/66 -- -- (!) 126 (!) 35 95 % --   07/08/22 1730 139/71 -- -- (!) 128 (!) 34 95 % --   07/08/22 1715 135/64 -- -- (!) 125 (!) 31 97 % --   07/08/22 1700 111/56 -- -- (!) 123 (!) 26 96 % --   07/08/22 1645 126/63 -- -- (!) 124 (!) 32 95 % --   07/08/22 1630 148/68 -- -- (!) 125 (!) 30 94 % --   07/08/22 1622 146/69 -- -- (!) 120 (!) 26 96 % --   07/08/22 1621 -- -- -- (!) 123 (!) 29 98 % --   07/08/22 1620 124/62 -- -- (!) 126 (!) 30 96 % --   07/08/22 1614 120/55 -- -- (!) 126 (!) 26 94 % --   07/08/22 1610 102/59 -- -- (!) 129 (!) 30 98 % --   07/08/22 1600 100/56 98.8 F (37.1 C) Axillary (!) 126 (!) 26 97 % --   07/08/22 1545 120/58 -- -- (!) 118 (!) 25 95 % --   07/08/22 1540 113/56 -- -- (!) 114 (!) 27 95 % --   07/08/22 1539 110/55 -- -- (!)  115 (!) 26 96 % --   07/08/22 1536 104/53 -- -- (!) 110 (!) 26 93 % --   07/08/22 1535 -- -- -- (!) 116 (!) 26 94 % --   07/08/22 1532 123/60 -- -- (!) 123 (!) 26 95 % --   07/08/22 1530 138/63 98 F (36.7 C) -- (!) 139 (!) 30 96 % --   07/08/22 1524 138/58 -- -- (!) 143 (!) 26 95 % --   07/08/22 1520 132/66 98 F (36.7 C) -- (!) 145 (!) 27 97 % --   07/08/22 1518 159/68 -- -- -- -- -- --   07/08/22 1515 159/68 -- -- (!) 101 (!) 25 98 % --   07/08/22 1500 146/72 98.1 F (36.7 C) -- (!) 115 (!) 31 95 % --   07/08/22 1430 137/63 -- -- (!) 110 (!) 27 94 % --   07/08/22 1420 135/62 -- -- (!) 110 (!) 31 97 % --   07/08/22 1400 105/57 -- -- (!) 110 (!) 28 96 % --   07/08/22 1332 140/64 -- -- (!) 102 (!) 26 99 % --   07/08/22 1330 140/64 -- -- (!) 106 (!) 29 96 % --   07/08/22 1312 128/74 -- -- 99 (!) 28 97 % --   07/08/22 1223 -- -- -- 97 (!) 27 96 % --   07/08/22 1206 141/65 -- -- (!) 102 (!) 30 93 % --   07/08/22 1200 -- 98.5 F (36.9 C) Axillary 100 (!) 27 92 % --   07/08/22 1130 164/71 -- -- (!) 113 (!) 32 96 % --   07/08/22 1100 141/66 -- -- (!) 113 (!) 35 95 % --   07/08/22 1030 144/67 -- -- (!) 104 (!) 27 97 % --   07/08/22 1020 123/74 -- -- (!) 102 (!) 29 96 % --   07/08/22 1000 131/62 -- -- 98 (!) 30 94 % --  07/08/22 0930 160/68 -- -- (!) 110 (!) 38 96 % --   07/08/22 0920 162/72 -- -- 95 (!) 28 98 % --   07/08/22 0900 163/83 -- -- (!) 105 (!) 32 97 % --   07/08/22 0830 124/58 -- -- (!) 101 (!) 29 95 % --       Patient observed - Ill appearing, opens eyes to stimuli.  Respiratory - non labored respirations, no audible wheezing. On supplemental NC.  Abdomen - wound vac in place  Skin - normal color and turgor.     Meds:     Current Facility-Administered Medications   Medication Dose Route Frequency    acetaminophen  650 mg Oral 4 times per day    albuterol-ipratropium  3 mL Nebulization Q6H SCH    insulin glargine  7 Units Subcutaneous QAM    insulin lispro  2-10 Units Subcutaneous Q4H SCH    meropenem  500  mg Intravenous Q24H    micafungin  100 mg Intravenous Q24H    pantoprazole  40 mg Intravenous BID    thiamine  100 mg Intravenous Q24H SCH     Continuous Infusions:   amiodarone 0.5 mg/min (07/09/22 0600)     PRN Meds:.benzocaine, dextrose **OR** dextrose **OR** dextrose **OR** glucagon (rDNA), diphenhydrAMINE, labetalol, lidocaine 2% jelly, naloxone, oxyCODONE, phenol    Labs Reviewed:     Recent Labs   Lab 07/09/22  0638 07/09/22  0208 07/08/22  0610 07/07/22  2120 07/07/22  0358   WBC  --  30.24* 34.08* 31.44* 31.38*   Hemoglobin 6.7* 6.9* 7.1* 6.6* 7.6*   Hematocrit 20.4* 20.8* 21.6* 21.1* 22.8*   Platelet Count  --  270 298 309 358*     Recent Labs   Lab 07/09/22  0207 07/08/22  0610 07/07/22  2120 07/06/22  1545 07/06/22  0341 07/03/22  1412 07/03/22  0347   Sodium 141 140 134*  More results in Results Review 139  More results in Results Review 136   Potassium 4.2 4.1 4.0  More results in Results Review 4.5  More results in Results Review 4.3   Chloride 106 107 103  More results in Results Review 106  More results in Results Review 106   CO2 20 19 18   More results in Results Review 21  More results in Results Review 23   BUN 97* 99* 94*  More results in Results Review 79*  More results in Results Review 23*   Creatinine 6.0* 5.6* 5.3*  More results in Results Review 5.3*  More results in Results Review 1.9*   Calcium 8.5 8.4 8.2  More results in Results Review 7.7*  More results in Results Review 10.4*   Albumin  --   --  1.4*  --  1.3*  --  1.4*  1.4*   Protein, Total  --   --  4.1*  --   --   --  5.1*   Bilirubin, Total  --   --  0.1*  --   --   --  0.2   Alkaline Phosphatase  --   --  69  --   --   --  61   ALT  --   --  15  --   --   --  16   AST (SGOT)  --   --  23  --   --   --  16   Glucose 181* 179* 277*  More results in Results Review  210*  More results in Results Review 238*   More results in Results Review = values in this interval not displayed.               Rads:   GI Radiological Procedures  since admission reviewed.   Radiology Results (24 Hour)       ** No results found for the last 24 hours. **            Signed by: Valorie Roosevelt, PA-C

## 2022-07-09 NOTE — Progress Notes (Signed)
[facility] PROGRESS NOTE      Date of Note: 07/08/2022   Patient Name: Shelby Barron     Date of Birth:  1940-08-10     MRN:               [id number]     PCP:               Patsy Lager, MD     I am available on epic chart and [employer] link extension [phone] Monday - Friday 8:30-16:30. If urology consultation is needed outside these hours please contact [facility] at [phone].      ASSESSMENT/  PLAN     -medical management per cardiology, GI, ID, nephrology, medicine. No additional urology recs.     I will discuss/discussed the plan of care with the following services:  Dr. Modesto Charon, Urology   Daughter at bedside     I spent a total of 10 minutes involved in this patient's care    Sheldon Silvan, PA  07/08/2022, 3:27 PM    SUBJECTIVE     Interval History: 6/10 radical cystectomy and bilateral nephrectomy with enterotomy  6/12 abdominal wound closure, bowel anastamosis. 6/18 CT no indicative of abscess or leak.     GI seeing for melena. Recommended conservative treatment     Patient Active Problem List    Diagnosis Date Noted   . Anemia 06/30/2022   . COPD (chronic obstructive pulmonary disease) 06/30/2022   . Bladder cancer 06/28/2022   . Malignant neoplasm of urinary bladder, unspecified site 06/25/2022   . Malignant tumor of kidney, left 06/25/2022   . GERD (gastroesophageal reflux disease) 06/25/2022   . Leukocytosis 06/25/2022   . Chronic diastolic congestive heart failure 06/23/2022   . Aortic atherosclerosis 06/23/2022   . Peritonitis 05/26/2022   . ESRD (end stage renal disease) 05/26/2022   . Hamstring injury 05/10/2013   . Benign essential hypertension 02/10/2013   . Left renal mass 11/28/2012   . Type 2 diabetes mellitus with kidney complication, with long-term current use of insulin 07/31/2010   . Diabetes mellitus type II, uncontrolled 07/31/2010   . 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        OBJECTIVE     Vital Signs: BP 132/66   Pulse (!) 145   Temp 98.1 F (36.7 C)   Resp (!) 27   Ht 1.575 m (5'  2")   Wt 93.2 kg (205 lb 7.5 oz)   SpO2 97%   BMI 37.58 kg/m     TMax: Temp (24hrs), Avg:98.2 F (36.8 C), Min:96.5 F (35.8 C), Max:98.8 F (37.1 C)    I/Os:   Intake/Output Summary (Last 24 hours) at 07/08/2022 1527  Last data filed at 07/08/2022 1200  Gross per 24 hour   Intake 1190 ml   Output 650 ml   Net 540 ml             LABS & IMAGING     CBC  Recent Labs     07/08/22  0610 07/07/22  2120 07/07/22  0358   WBC 34.08* 31.44* 31.38*   RBC 2.28* 2.20* 2.45*   Hematocrit 21.6* 21.1* 22.8*   MCV 94.7 95.9 93.1   MCH 31.1 30.0 31.0   MCHC 32.9 31.3* 33.3   RDW 15 16* 16*   MPV 11.2 10.8 10.6       BMP  Recent Labs     07/08/22  0610 07/07/22  2120 07/07/22  0358  CO2 19 18 21    Anion Gap 14.0 13.0 13.0   BUN 99* 94* 68*   Creatinine 5.6* 5.3* 4.2*       INR  Lab Results   Component Value Date/Time    INR 1.1 07/01/2022 11:20 AM    INR 1.1 06/01/2022 03:15 AM         IMAGING:  XR Chest AP Portable   Final Result    There is mild interstitial prominence suggesting mild   congestive heart failure/fluid overload in the setting of shortness of   breath      [name], MD   07/07/2022 12:32 PM      XR Chest AP Portable   Final Result       Hypoventilation with basilar atelectasis.      Charlott Rakes, MD   07/06/2022 3:35 PM      CT Abdomen Pelvis W IV And PO Cont   Final Result      1.Postoperative changes from bilateral nephrectomy and cystectomy.    2.Small amount of ascites including some areas which appear loculated.   Small collections of fluid and gas in the anterior midline abdomen may be   postoperative but superimposed infection is not excluded. No rim-enhancing   fluid collections or extravasation of oral contrast to suggest anastomotic   leak.      Demetrios Isaacs, MD   07/06/2022 5:51 AM      CT Head WO Contrast   Final Result       1.No acute intracranial abnormality is seen.      Leandro Reasoner, MD   07/06/2022 3:56 AM      XR Abdomen AP   Final Result      Enteric tube terminates in the distal stomach.       Elease Etienne, DO   07/05/2022 12:24 PM      XR Chest AP Portable   Final Result         1. Lines and tubes in adequate position. No pneumothorax.   2. Trace left pleural effusion and bibasilar atelectasis.      Jasmine December D'Heureux, MD   06/30/2022 4:53 PM      XR Chest AP Portable   Final Result      Catheters as described.      Pankaj Dominica, MD   06/30/2022 2:07 PM      XR Chest AP Portable   Final Result    Right mainstem bronchial intubation. The tube should be   withdrawn about 4 cm.      Dr. Tildon Husky is aware.      Wynema Birch, MD   06/28/2022 4:56 PM      XR OR MISCOUNT   Final Result    No unexpected foreign body.      These critical results were discussed with and acknowledged by Angie R.,   the circulating nurse, who will communicate the findings to Everardo All,   MD on 06/28/2022 3:02 PM.         Bosie Helper, MD   06/28/2022 3:03 PM

## 2022-07-09 NOTE — Progress Notes (Signed)
HD completed x3.5 hrs Net fljuid off 1.1L, unable to reach 2L UF goal due to BP and pulse  unsteady tele sometimes show SVT, Afib that last 5-8sec, nephrologist aware, no change in pt orientation status,, dressing intact post HD, report was given to primary nurse.    Patient Vitals for the past 4 hrs:   BP Temp Pulse Resp   07/09/22 1746 142/65 98 F (36.7 C) (!) 127 22   07/09/22 1730 141/68 -- (!) 132 (!) 24   07/09/22 1715 141/59 -- (!) 126 22   07/09/22 1700 135/62 -- (!) 126 (!) 28   07/09/22 1645 155/68 -- (!) 128 (!) 23   07/09/22 1630 157/75 -- (!) 126 (!) 24   07/09/22 1615 139/62 -- (!) 123 22   07/09/22 1611 139/62 -- (!) 127 22   07/09/22 1605 146/63 -- (!) 125 (!) 23   07/09/22 1600 146/63 97.6 F (36.4 C) 97 22   07/09/22 1556 130/60 -- 77 (!) 25   07/09/22 1545 118/56 -- 78 (!) 30   07/09/22 1530 121/58 -- 80 (!) 28   07/09/22 1515 104/55 -- 85 (!) 25   07/09/22 1507 110/56 98.4 F (36.9 C) 86 (!) 35   07/09/22 1500 110/56 -- 84 (!) 41   07/09/22 1450 115/53 97.7 F (36.5 C) 83 (!) 30   07/09/22 1445 149/68 -- 88 (!) 38   07/09/22 1430 117/60 -- 79 (!) 27   07/09/22 1419 146/66 97.7 F (36.5 C) 80 (!) 32   07/09/22 1400 -- -- 80 (!) 33   07/09/22 1350 147/65 97.7 F (36.5 C) 80 (!) 32       07/09/22 1746   Treatment Summary   Time Off Machine 1746   Duration of Treatment (Hours) 3.5   Treatment Type 1:1   Dialyzer Clearance Moderately streaked   Fluid Volume Off (mL) 1600   Prime Volume (mL) 200   Rinseback Volume (mL) 200   Fluid Given: Normal Saline (mL) 100   Fluid Given: PRBC  0 mL   Fluid Given: Albumin (mL) 0   Fluid Given: Other (mL) 0   Total Fluid Given 500   Hemodialysis Net Fluid Removed 1100   Post Treatment Assessment   Post-Treatment Weight (Kg) -1.1   Patient Response to Treatment fairly tolerated   Temporary Catheter with Pigtail 06/30/22 Temporary Catheter Right   Placement Date/Time: 06/30/22 1700   Access Type: Temporary Catheter  Orientation: Right  Central Line Infection  Prevention Education provided?: Yes  Hand Hygiene: Chlorhexidine;Alcohol based hand scrub  Line cart used?: Yes  Line kit used?: Yes  Maxi...   Line necessity reviewed? Apheresis/hemodialysis   Site Assessment Dry;Clean;Intact   Catheter Lumen Volume Venous 1.2 mL   Catheter Lumen Volume Arterial 1.2 mL   Distal Lumen Status Infusing   Tego/Curos Caps on Catheter Yes   NEW Tego/Curos Caps placed (Date) 07/09/22   Vitals   Temp 98 F (36.7 C)   Heart Rate (!) 127   Resp Rate 22   BP 142/65   SpO2 98 %   Assessment   Cardiac (WDL) WDL   Cardiac Regularity Irregular   Cardiac Rhythm Atrial fibrillation   Bilateral Breath Sounds Diminished   R Breath Sounds Diminished   L Breath Sounds Diminished   Pain Assessment   Charting Type Reassessment   Pain Scale Used CPOT   Numeric Pain Scale   Pain Score 0   POSS Score S  Bedside Nurse Communication   Name of bedside RN - post dialysis Constance Goltz, RN (

## 2022-07-09 NOTE — Plan of Care (Signed)
Neuro: Patient is not interactive and does not follow commands. PRN tylenol and oxycodone for pain   CV: Normal sinus rhythm/A.fib HR: 70s-90s BP:130s-150s systolic PRN labetalol for BP above 180 systolic   PULM: 3L NC crackles   GI: right ND tube at 78cm, clamped. Rectal tube   GU: Anuric   ID: Meropenem   INTEG: abdominal incision, wound vac. Wound care following   Problem: Moderate/High Fall Risk Score >5  Goal: Patient will remain free of falls  Flowsheets (Taken 07/09/2022 1200)  High (Greater than 13):   HIGH-Visual cue at entrance to patient's room   HIGH-Bed alarm on at all times while patient in bed   HIGH-Initiate use of floor mats as appropriate     Problem: SCIP  Goal: SCIP measures are followed  Flowsheets (Taken 07/08/2022 1406 by Roberto Scales, RN)  SCIP measures are followed:   Administer antibiotics as ordered   VTE Prevention: Administer anticoagulant(s) and/or apply anti-embolism stockings/devices as ordered     Problem: Inadequate Airway Clearance  Goal: Patent Airway maintained  Flowsheets (Taken 07/09/2022 0032 by Murrell Redden Cam Nhung, RN)  Patent airway maintained:   Position patient for maximum ventilatory efficiency   Reinforce use of ordered respiratory interventions (i.e. CPAP, BiPAP, Incentive Spirometer, Acapella, etc.)   Provide adequate fluid intake to liquefy secretions     Problem: Infection Prevention  Goal: Free from infection  Flowsheets (Taken 07/08/2022 1406 by Roberto Scales, RN)  Free from infection:   Monitor/assess vital signs   Encourage/assist patient to turn, cough and perform deep breathing every 2 hours   Assess incision for evidence of healing   Assess for signs and symptoms of infection   Monitor/assess lab values and report abnormal values   Monitor/assess output from surgical drain if present     Problem: Safety  Goal: Patient will be free from injury during hospitalization  Flowsheets (Taken 07/08/2022 1406 by Roberto Scales, RN)  Patient will be free from injury  during hospitalization:   Assess patient's risk for falls and implement fall prevention plan of care per policy   Use appropriate transfer methods   Provide and maintain safe environment   Ensure appropriate safety devices are available at the bedside   Include patient/ family/ care giver in decisions related to safety   Hourly rounding   Assess for patients risk for elopement and implement Elopement Risk Plan per policy   Provide alternative method of communication if needed (communication boards, writing)  Goal: Patient will be free from infection during hospitalization  Flowsheets (Taken 07/08/2022 1406 by Roberto Scales, RN)  Free from Infection during hospitalization:   Assess and monitor for signs and symptoms of infection   Monitor lab/diagnostic results   Monitor all insertion sites (i.e. indwelling lines, tubes, urinary catheters, and drains)   Encourage patient and family to use good hand hygiene technique     Problem: Pain  Goal: Pain at adequate level as identified by patient  Flowsheets (Taken 07/09/2022 0730 by Murrell Redden Cam Nhung, RN)  Pain at adequate level as identified by patient:   Evaluate if patient comfort function goal is met   Reassess pain within 30-60 minutes of any procedure/intervention, per Pain Assessment, Intervention, Reassessment (AIR) Cycle   Offer non-pharmacological pain management interventions   Consult/collaborate with Pain Service   Evaluate patient's satisfaction with pain management progress     Problem: Hemodynamic Status: Cardiac  Goal: Stable vital signs and fluid balance  Flowsheets (Taken 07/06/2022 0810 by Raynald Blend,  Samuel Bouche, RN)  Stable vital signs and fluid balance:   Assess signs and symptoms associated with cardiac rhythm changes   Monitor lab values     Problem: Renal Instability  Goal: Fluid and electrolyte balance are achieved/maintained  Flowsheets (Taken 07/08/2022 1406 by Roberto Scales, RN)  Fluid and electrolyte balance are achieved/maintained:   Monitor/assess  lab values and report abnormal values   Assess and reassess fluid and electrolyte status   Observe for cardiac arrhythmias   Monitor for muscle weakness   Follow fluid restrictions/IV/PO parameters     Problem: Patient Receiving Advanced Renal Therapies  Goal: Therapy access site remains intact  Flowsheets (Taken 07/06/2022 1009 by Bunnie Philips, RN)  Therapy access site remains intact:   Assess therapy access site   Change therapy access site dressing as needed     Problem: Diabetes: Glucose Imbalance  Goal: Blood glucose stable at established goal  Flowsheets (Taken 07/09/2022 0032 by Murrell Redden Cam Nhung, RN)  Blood glucose stable at established goal:   Monitor lab values   Monitor intake and output.  Notify LIP if urine output is < 30 mL/hour.   Include patient/family in decisions related to nutrition/dietary selections   Monitor/assess vital signs   Coordinate medication administration with meals, as indicated   Assess for hypoglycemia /hyperglycemia   Follow fluid restrictions/IV/PO parameters   Ensure appropriate consults are obtained (Nutrition, Diabetes Education, and Case Management)   Ensure appropriate diet and assess tolerance   Ensure adequate hydration     Problem: Inadequate Gas Exchange  Goal: Patent Airway maintained  Flowsheets (Taken 07/09/2022 0032 by Murrell Redden Cam Nhung, RN)  Patent airway maintained:   Position patient for maximum ventilatory efficiency   Reinforce use of ordered respiratory interventions (i.e. CPAP, BiPAP, Incentive Spirometer, Acapella, etc.)   Provide adequate fluid intake to liquefy secretions

## 2022-07-09 NOTE — Progress Notes (Signed)
LOS # 11       Summary of Discharge Plan:Pt to discharge Home with [facility] services vs LTAC vs TBD        Identified Possible Discharge Barriers:Medical stability ( pt not interactive and does not follow commands; on 3L NC; IV abx;rectal tube; anuric)        CM Interventions and Outcome: CM reviewed pt's chart         Discussed above Discharge Plan with (patient, family, Care Team, others): Care Team        Case Management will continue to follow for [place] needs that may arise.     Marylou Mccoy RN BSN CM I  [facility]  [phone]

## 2022-07-09 NOTE — Plan of Care (Signed)
Pt opens eyes spontaneously. Withdraws to pain in RUE, flaccid in LUE and BLE. Non-interactive. Moans and groans. CPOT 4. PRN Oxycodone x2 given. NSR to A-fib. HR 80-100's. SBP 130-150's. NC at 3L. Lung sounds diminished. Wound vac to abdomin. Site clean/dry/intact. NPO. Tube feeds held at this time. Q4 BG checks. FMS attached, brown diarrhea. Anuric. Redness on sacrum, otherwise skin intact. Amiodarone 0.5 mg/min running at 16.7 mL/hr. Morning hemoglobin 6.9. Notified provider and was told to repeat H+H within a few hours if vital signs stable.  Pt has transfer orders in and currently waiting for bed placement.     Problem: Inadequate Airway Clearance  Goal: Patent Airway maintained  Outcome: Progressing  Flowsheets (Taken 07/09/2022 0032)  Patent airway maintained:   Position patient for maximum ventilatory efficiency   Reinforce use of ordered respiratory interventions (i.e. CPAP, BiPAP, Incentive Spirometer, Acapella, etc.)   Provide adequate fluid intake to liquefy secretions     Problem: Constipation  Goal: Fluid and electrolyte balance are achieved/maintained  Outcome: Progressing  Flowsheets (Taken 07/09/2022 0032)  Fluid and electrolyte balance are achieved/maintained:   Monitor/assess lab values and report abnormal values   Observe for cardiac arrhythmias   Monitor for muscle weakness   Assess and reassess fluid and electrolyte status     Problem: Fluid and Electrolyte Imbalance/ Endocrine  Goal: Fluid and electrolyte balance are achieved/maintained  Outcome: Progressing  Flowsheets (Taken 07/09/2022 0032)  Fluid and electrolyte balance are achieved/maintained:   Monitor/assess lab values and report abnormal values   Observe for cardiac arrhythmias   Monitor for muscle weakness   Assess and reassess fluid and electrolyte status     Problem: Diabetes: Glucose Imbalance  Goal: Blood glucose stable at established goal  Outcome: Progressing  Flowsheets (Taken 07/09/2022 0032)  Blood glucose stable at established  goal:   Monitor lab values   Monitor intake and output.  Notify LIP if urine output is < 30 mL/hour.   Include patient/family in decisions related to nutrition/dietary selections   Monitor/assess vital signs   Coordinate medication administration with meals, as indicated   Assess for hypoglycemia /hyperglycemia   Follow fluid restrictions/IV/PO parameters   Ensure appropriate consults are obtained (Nutrition, Diabetes Education, and Case Management)   Ensure appropriate diet and assess tolerance   Ensure adequate hydration     Problem: Inadequate Gas Exchange  Goal: Patent Airway maintained  Outcome: Progressing  Flowsheets (Taken 07/09/2022 0032)  Patent airway maintained:   Position patient for maximum ventilatory efficiency   Reinforce use of ordered respiratory interventions (i.e. CPAP, BiPAP, Incentive Spirometer, Acapella, etc.)   Provide adequate fluid intake to liquefy secretions

## 2022-07-09 NOTE — Progress Notes (Signed)
[facility]  PROGRESS NOTE      Patient: Shelby Barron  Date:07/08/2022   LOS: 11 Days  Admission Date: 06/28/2022   MRN: [id number]  Attending: Ester Rink, DO  When on service as the attending, please contact me on Epic Secure Chat from 7 AM - 7 PM for non-urgent issues. For urgent matters use XTend page from 7 AM - 7 PM.       ASSESSMENT/PLAN     DELONNA Barron is a 82 y.o. female admitted with Bladder cancer    Interval Summary: [name], [name], type 2 diabetes, renal cell carcinoma status post partial bilateral nephrectomy, [name], new diagnosis of urothelial cancer initally presented on 6/10 for elective cystectomy and completion nephrectomy, [name], [name], extubated and off pressors since 6/13.     She is hypoactive and delirious. Surgery recommended NGT and CTAP today. Plan to start TF today. CTH pending.  Has [name] T/Th/Sat. Nephrology following     Developed new onset afib RVR today during HD. Stopped norvasc switched to metoprolol and gave additional IV metoprolol. Consulted cardiology. Discussed with ICU requested a consult given deterioration. Recommended repeat blood cultures and broadening antibiotics to meropenem. Palliative care consulted.    Rising WBC. Awaiting blood cultures. Patient tachypneic CXR with pulm edema. Discussed with nephrology giving 3 mg IV bumex. Had dark emesis. Vitals stable. TFs held, started IV PPI, follow AM labs. Discussed with GI and surgery.    Again afib RVR HD canceled, started amiodarone infusion.    Patient Active Hospital Problem List:  #S/P Cystectomy and completion nephrectomy complicated by small bowel enterotomy   #Mixed shock - hemorrhagic vs septic: Resolved  - 6/10 small bowel enteretomy with 1L EBL. Went to the OR on June 12 for small bowel anastomosis, closure of mesenteric defect, closure of abdominal wall  with wound VAC application  - Failed SLP eval yesterday. Will plan to initiate on tube feeds per nutrition recs  -Wound VAC changed 6/15  - Surgery consulted, recommend CTAP with PO contrast to eval for intra-abdominal source of infection given uptrending WBC. Surgery ordered pre-medication with steroid/benadryl given h/o contrast allergy  - trend CBC, transfuse Hgb<7. Transfused 3 units PRBC in OR. Received another unit on 6/11 and 6/12  - TTE as part of preop cardiac clearance which showed Mild aortic stenosis (AVA 1.7), grade 1 diastolic dysfunction, normal LV/RV systolic function  - ID consulted, continue IV cefepime and flagyl     #History of CVA  #Encephalopathy/delirium  - CT head pending  - Hold home plavix  - Adhere to day/night cycles and administer constant reorientation.   - CAM ICU monitoring with early initiation of atypical antipsychotics as needed for control of agitated delirium.   - Out of bed to chair with PT/OT consult as indicated.      #Acute hypoxemic respiratory failure  #COPD  Extubated on 6/13, currently on 2L NC   Maintain SaO2 > 92%     #ESRD  HD Tues/Thurs/Sat  Will need eventual permcath exchange this week     #HTN  Started on norvasc  Off nicardipine gtt, PRN IV labetalol for SBP > 180     #Renal cell carcinoma  #Urothelial carcinoma  #Thrombocytopenia  - VTE ppx: SCDs, subcutaneous heparin     #Diabetes Mellitus type 2  -  Sliding scale insulin          Nutrition: NPO  Malnutrition Documentation    Moderate Malnutrition related to inadequate nutritional intake in the setting of acute illness as evidenced by <50% EER > 5 days, mild muscle losses (temporalis, deltoid) - new                 Patient has BMI=Body mass index is 37.54 kg/m.  Diagnosis: Obesity based on BMI criteria                  Recent Labs   Lab 07/09/22  0638 07/09/22  0208 07/08/22  0610 07/07/22  2120   Hemoglobin 6.7* 6.9* 7.1* 6.6*   Hematocrit 20.4* 20.8* 21.6* 21.1*   MCV  --  93.3 94.7 95.9   WBC  --  30.24*  34.08* 31.44*   Platelet Count  --  270 298 309     Recent Labs   Lab 06/02/22  0514   Iron 62   TIBC 145*   Iron Saturation 43       Code Status: Full Code    Dispo: pending improved mental status    Family Contact: none    DVT Prophylaxis:   Current Facility-Administered Medications (Includes Only Anticoagulants, Misc. Hematological)   Medication Dose Route Last Admin   None          CHART  REVIEW & DISCUSSION     The following chart items were reviewed as of 8:16 AM on 07/09/22:  []  Lab Results []  Imaging Results   []  Problem List  []  Current Orders []  Current Medications  []  Allergies  []  Code Status []  Previous Notes   []  SDoH    The management and plan of care for this patient was discussed with the following specialty consultants:  []  Cardiology  []  Gastroenterology                 []  Infectious Disease  []  Pulmonology []  Neurology                [x]  Nephrology  []  Neurosurgery []  Orthopedic Surgery  []  Heme/Onc  []  General Surgery []  Psychiatry                                   []  Palliative    SUBJECTIVE     [name] moaning, not responding appropriately   Family updated bedside    MEDICATIONS     Current Facility-Administered Medications   Medication Dose Route Frequency    acetaminophen  650 mg Oral 4 times per day    albuterol-ipratropium  3 mL Nebulization Q6H SCH    insulin glargine  7 Units Subcutaneous QAM    insulin lispro  2-10 Units Subcutaneous Q4H SCH    meropenem  500 mg Intravenous Q24H    micafungin  100 mg Intravenous Q24H    pantoprazole  40 mg Intravenous BID    thiamine  100 mg Intravenous Q24H SCH       PHYSICAL EXAM     Vitals:    07/09/22 0800   BP: 125/60   Pulse: 87   Resp: (!) 36   Temp:    SpO2: 99%       Temperature: Temp  Min: 98 F (36.7 C)  Max: 98.8 F (37.1 C)  Pulse: Pulse  Min: 84  Max: 145  Respiratory: Resp  Min:  25  Max: 54  Non-Invasive BP: BP  Min: 100/56  Max: 164/71  Pulse Oximetry SpO2  Min: 92 %  Max: 100 %    Intake and Output Summary (Last 24 hours) at  Date Time    Intake/Output Summary (Last 24 hours) at 07/09/2022 0816  Last data filed at 07/09/2022 0700  Gross per 24 hour   Intake 720.9 ml   Output -417 ml   Net 1137.9 ml         GEN APPEARANCE: Normal;  A&OX0  HEENT: PERLA; EOMI; Conjunctiva Clear  NECK: Supple; No bruits  CVS: RRR, S1, S2; No M/G/R  LUNGS: CTAB; No Wheezes; No Rhonchi: No rales  ABD: Soft; No TTP; + Normoactive BS  EXT: No edema; Pulses 2+ and intact  NEURO: moving UEs spontaneously, moaning intermittently unable to cooperate fully in MS exam  MENTAL STATUS:     LABS     Recent Labs   Lab 07/09/22  0638 07/09/22  0208 07/08/22  0610 07/07/22  2120   WBC  --  30.24* 34.08* 31.44*   RBC  --  2.23* 2.28* 2.20*   Hemoglobin 6.7* 6.9* 7.1* 6.6*   Hematocrit 20.4* 20.8* 21.6* 21.1*   MCV  --  93.3 94.7 95.9   Platelet Count  --  270 298 309       Recent Labs   Lab 07/09/22  0207 07/08/22  0610 07/07/22  2120 07/07/22  0358 07/06/22  1545 07/06/22  0341   Sodium 141 140 134* 138 139 139   Potassium 4.2 4.1 4.0 3.8 3.8 4.5   Chloride 106 107 103 104 103 106   CO2 20 19 18 21 23 21    BUN 97* 99* 94* 68* 49* 79*   Creatinine 6.0* 5.6* 5.3* 4.2* 3.4* 5.3*   Glucose 181* 179* 277* 230* 200* 210*   Calcium 8.5 8.4 8.2 8.1 7.8* 7.7*   Magnesium 2.2 2.2 2.3 2.1  --  2.3       Recent Labs   Lab 07/07/22  2120 07/06/22  0341 07/03/22  0347   ALT 15  --  16   AST (SGOT) 23  --  16   Bilirubin, Total 0.1*  --  0.2   Bilirubin Direct  --   --  0.1   Albumin 1.4* 1.3* 1.4*  1.4*   Alkaline Phosphatase 69  --  61                     Microbiology Results (last 15 days)       Procedure Component Value Units Date/Time    Culture, Blood, Aerobic And Anaerobic [[id number]] Collected: 07/07/22 0355    Order Status: Completed Specimen: Blood, Venous Updated: 07/09/22 0700     Culture Blood No growth at 2 days    Culture, Blood, Aerobic And Anaerobic [[id number]] Collected: 07/06/22 1837    Order Status: Completed Specimen: Blood, Venous Updated: 07/09/22 0000     Culture  Blood No growth at 2 days    Culture, Sputum and Lower Respiratory [[id number]]     Order Status: Sent Specimen: Sputum, Induced     Culture, Sputum and Lower Respiratory [[id number]]  (Abnormal) Collected: 07/04/22 1034    Order Status: Completed Specimen: Sputum, Suctioned Updated: 07/07/22 1529     Culture Respiratory Heavy growth of Mixed upper respiratory flora      Light growth of Gram negative rod     Comment: Questionable significance due to  low quantity.  No further work.        Gram Stain Few WBCs      Rare Squamous epithelial cells      Few Mixed respiratory flora    Rectal Swab Carbapenemase Surveillance, PCR [[id number]]  (Normal) Collected: 06/29/22 1842    Order Status: Completed Specimen: Swab from Rectum Updated: 06/30/22 0150     IMP gene sequence Not Detected     VIM gene sequence Not Detected     NDM gene sequence Not Detected     KPC gene sequence Not Detected     OXA-48 gene sequence Not Detected    Narrative:      This test utilizes real-time PCR for the detection and differentiation of gene sequences associated with carbapenem-non-susceptibility in Enterobacterales (Enterobacteriaceae), Pseudomonas aeruginosa and Acinetobacter baumannii. Targets include blaKPC, blaNDM, blaVIM, blaOXA-48 (and blaOXA-181), and blaIMP (excluding IMP-7, IMP-13, IMP-14). Detection of these gene sequences does not indicate the presence of viable organisms. This assay does not report variants of these gene sequences. Mutations or polymorphisms may affect detection of current, new, or unknown variants, resulting in a false negative result. This test is for infection prevention surveillance purposes only and should not be used to make treatment decisions. The performance of the Xpert Carba-R Assay has not been evaluated with rectal swab specimens from pediatric patients.             RADIOLOGY     XR Chest AP Portable    Result Date: 07/07/2022   There is mild interstitial prominence suggesting mild congestive heart  failure/fluid overload in the setting of shortness of breath [name], MD 07/07/2022 12:32 PM    XR Chest AP Portable    Result Date: 07/06/2022   Hypoventilation with basilar atelectasis. Charlott Rakes, MD 07/06/2022 3:35 PM    CT Abdomen Pelvis W IV And PO Cont    Result Date: 07/06/2022  1.Postoperative changes from bilateral nephrectomy and cystectomy. 2.Small amount of ascites including some areas which appear loculated. Small collections of fluid and gas in the anterior midline abdomen may be postoperative but superimposed infection is not excluded. No rim-enhancing fluid collections or extravasation of oral contrast to suggest anastomotic leak. Demetrios Isaacs, MD 07/06/2022 5:51 AM    CT Head WO Contrast    Result Date: 07/06/2022   1.No acute intracranial abnormality is seen. Leandro Reasoner, MD 07/06/2022 3:56 AM    XR Abdomen AP    Result Date: 07/05/2022  Enteric tube terminates in the distal stomach. Elease Etienne, DO 07/05/2022 12:24 PM    XR Chest AP Portable    Result Date: 06/30/2022  1. Lines and tubes in adequate position. No pneumothorax. 2. Trace left pleural effusion and bibasilar atelectasis. Jasmine December D'Heureux, MD 06/30/2022 4:53 PM    XR Chest AP Portable    Result Date: 06/30/2022  Catheters as described. Pankaj Dominica, MD 06/30/2022 2:07 PM    XR Chest AP Portable    Result Date: 06/28/2022   Right mainstem bronchial intubation. The tube should be withdrawn about 4 cm. Dr. Tildon Husky is aware. Wynema Birch, MD 06/28/2022 4:56 PM    XR OR MISCOUNT    Result Date: 06/28/2022   No unexpected foreign body. These critical results were discussed with and acknowledged by Angie R., the circulating nurse, who will communicate the findings to Everardo All, MD on 06/28/2022 3:02 PM. Bosie Helper, MD 06/28/2022 3:03 PM   Echo Results       None  No results found for this or any previous visit.    Palma Holter, DO  8:16 AM 07/09/2022

## 2022-07-10 ENCOUNTER — Inpatient Hospital Stay: Payer: No Typology Code available for payment source

## 2022-07-10 LAB — WHOLE BLOOD GLUCOSE POCT
Whole Blood Glucose POCT: 124 mg/dL — ABNORMAL HIGH (ref 70–100)
Whole Blood Glucose POCT: 136 mg/dL — ABNORMAL HIGH (ref 70–100)
Whole Blood Glucose POCT: 137 mg/dL — ABNORMAL HIGH (ref 70–100)
Whole Blood Glucose POCT: 181 mg/dL — ABNORMAL HIGH (ref 70–100)
Whole Blood Glucose POCT: 134 mg/dL — ABNORMAL HIGH (ref 70–100)
Whole Blood Glucose POCT: 135 mg/dL — ABNORMAL HIGH (ref 70–100)

## 2022-07-10 LAB — CBC
Absolute nRBC: 0.05 10*3/uL — ABNORMAL HIGH (ref <=0.00)
Hematocrit: 25.2 % — ABNORMAL LOW (ref 34.7–43.7)
Hemoglobin: 8.4 g/dL — ABNORMAL LOW (ref 11.4–14.8)
MCH: 30.1 pg (ref 25.1–33.5)
MCHC: 33.3 g/dL (ref 31.5–35.8)
MCV: 90.3 fL (ref 78.0–96.0)
MPV: 11 fL (ref 8.9–12.5)
Platelet Count: 239 10*3/uL (ref 142–346)
RBC: 2.79 10*6/uL — ABNORMAL LOW (ref 3.90–5.10)
RDW: 16 % — ABNORMAL HIGH (ref 11–15)
WBC: 23.01 10*3/uL — ABNORMAL HIGH (ref 3.10–9.50)
nRBC %: 0.2 /100 WBC — ABNORMAL HIGH (ref <=0.0)

## 2022-07-10 LAB — BASIC METABOLIC PANEL
Anion Gap: 15 (ref 5.0–15.0)
BUN: 41 mg/dL — ABNORMAL HIGH (ref 7–21)
CO2: 23 mEq/L (ref 17–29)
Calcium: 8 mg/dL (ref 7.9–10.2)
Chloride: 103 mEq/L (ref 99–111)
Creatinine: 3.6 mg/dL — ABNORMAL HIGH (ref 0.4–1.0)
GFR: 11.9 mL/min/{1.73_m2} — ABNORMAL LOW (ref >=60.0)
Glucose: 124 mg/dL — ABNORMAL HIGH (ref 70–100)
Potassium: 3.6 mEq/L (ref 3.5–5.3)
Sodium: 141 mEq/L (ref 135–145)

## 2022-07-10 LAB — MAGNESIUM: Magnesium: 1.9 mg/dL (ref 1.6–2.6)

## 2022-07-10 LAB — PHOSPHORUS: Phosphorus: 4.9 mg/dL — ABNORMAL HIGH (ref 2.3–4.7)

## 2022-07-10 MED ORDER — ALBUMIN HUMAN 25 % IV SOLN
100.0000 mL | INTRAVENOUS | Status: AC | PRN
Start: 2022-07-10 — End: 2022-07-10

## 2022-07-10 MED ORDER — SODIUM CHLORIDE 0.9 % IV BOLUS
250.0000 mL | INTRAVENOUS | Status: AC | PRN
Start: 2022-07-10 — End: 2022-07-10

## 2022-07-10 MED ORDER — SODIUM CHLORIDE 0.9 % IV BOLUS
100.0000 mL | INTRAVENOUS | Status: AC | PRN
Start: 2022-07-10 — End: 2022-07-10

## 2022-07-10 MED ORDER — ALBUTEROL-IPRATROPIUM 2.5-0.5 (3) MG/3ML IN SOLN
3.0000 mL | Freq: Four times a day (QID) | RESPIRATORY_TRACT | Status: DC | PRN
Start: 2022-07-10 — End: 2022-08-18

## 2022-07-10 MED ORDER — ALBUTEROL-IPRATROPIUM 2.5-0.5 (3) MG/3ML IN SOLN
3.0000 mL | Freq: Four times a day (QID) | RESPIRATORY_TRACT | Status: DC
Start: 2022-07-11 — End: 2022-07-19
  Administered 2022-07-11 – 2022-07-18 (×21): 3 mL via RESPIRATORY_TRACT
  Filled 2022-07-10 (×20): qty 3

## 2022-07-10 NOTE — Plan of Care (Signed)
Problem: Moderate/High Fall Risk Score >5  Goal: Patient will remain free of falls  Outcome: Progressing  Flowsheets (Taken 07/10/2022 2000)  Moderate Risk (6-13):   MOD- Consider video monitoring   MOD-include family in multidisciplinary POC discussions   MOD-Use gait belt when appropriate   MOD-Request PT/OT consult order for patients with gait/mobility impairment   MOD-Utilize diversion activities   MOD-Perform dangle, stand, walk (DSW) prior to mobilization   MOD-Re-orient confused patients   MOD-Place bedside commode and assistive devices out of sight when not in use   MOD-Consider a move closer to [employer]   MOD-Remain with patient during toileting   MOD-Floor mat at bedside (where available) if appropriate   MOD-Apply bed exit alarm if patient is confused   MOD-Consider activation of bed alarm if appropriate   LOW-Anticoagulation education for injury risk   LOW-Fall Interventions Appropriate for Low Fall Risk     Problem: Compromised Sensory Perception  Goal: Sensory Perception Interventions  Outcome: Progressing  Flowsheets (Taken 07/10/2022 2309)  Sensory Perception Interventions: Offload heels, Pad bony prominences, Reposition q 2hrs/turn Clock, Q2 hour skin assessment under devices if present     Problem: Compromised Moisture  Goal: Moisture level Interventions  Outcome: Progressing  Flowsheets (Taken 07/10/2022 2309)  Moisture level Interventions: Moisture wicking products, Moisture barrier cream     Problem: Compromised Activity/Mobility  Goal: Activity/Mobility Interventions  Outcome: Progressing  Flowsheets (Taken 07/10/2022 2309)  Activity/Mobility Interventions: Pad bony prominences, TAP Seated positioning system when OOB, Promote PMP, Reposition q 2 hrs / turn clock, Offload heels     Problem: Compromised Friction/Shear  Goal: Friction and Shear Interventions  Outcome: Progressing  Flowsheets (Taken 07/10/2022 2309)  Friction and Shear Interventions: Pad bony prominences, Off load heels, HOB 30  degrees or less unless contraindicated, Consider: TAP seated positioning, Heel foams     Problem: Inadequate Airway Clearance  Goal: Patent Airway maintained  Outcome: Progressing  Flowsheets (Taken 07/10/2022 2309)  Patent airway maintained:   Position patient for maximum ventilatory efficiency   Suction secretions as needed   Reinforce use of ordered respiratory interventions (i.e. CPAP, BiPAP, Incentive Spirometer, Acapella, etc.)   Reposition patient every 2 hours and as needed unless able to self-reposition   Provide adequate fluid intake to liquefy secretions  Goal: Normal respiratory rate/effort achieved/maintained  Outcome: Progressing  Flowsheets (Taken 07/10/2022 2309)  Normal respiratory rate/effort achieved/maintained: Plan activities to conserve energy: plan rest periods     Problem: Infection Prevention  Goal: Free from infection  Outcome: Progressing  Flowsheets (Taken 07/10/2022 2309)  Free from infection:   Monitor/assess vital signs   Encourage/assist patient to turn, cough and perform deep breathing every 2 hours   Assess incision for evidence of healing   Assess for signs and symptoms of infection   Monitor/assess output from surgical drain if present   Monitor/assess lab values and report abnormal values     Problem: Impaired Mobility  Goal: Mobility/Activity is maintained at optimal level for patient  Outcome: Progressing  Flowsheets (Taken 07/10/2022 2309)  Mobility/activity is maintained at optimal level for patient: Encourage independent activity per ability     Problem: Constipation  Goal: Fluid and electrolyte balance are achieved/maintained  Outcome: Progressing  Flowsheets (Taken 07/10/2022 2309)  Fluid and electrolyte balance are achieved/maintained:   Monitor/assess lab values and report abnormal values   Observe for cardiac arrhythmias   Monitor for muscle weakness   Assess and reassess fluid and electrolyte status     Problem: Safety  Goal: Patient will be free from injury during  hospitalization  Outcome: Progressing  Flowsheets (Taken 07/10/2022 2309)  Patient will be free from injury during hospitalization:   Assess patient's risk for falls and implement fall prevention plan of care per policy   Use appropriate transfer methods   Include patient/ family/ care giver in decisions related to safety   Ensure appropriate safety devices are available at the bedside   Hourly rounding   Provide and maintain safe environment   Assess for patients risk for elopement and implement Elopement Risk Plan per policy  Goal: Patient will be free from infection during hospitalization  Outcome: Progressing  Flowsheets (Taken 07/10/2022 2309)  Free from Infection during hospitalization:   Assess and monitor for signs and symptoms of infection   Monitor all insertion sites (i.e. indwelling lines, tubes, urinary catheters, and drains)   Encourage patient and family to use good hand hygiene technique   Monitor lab/diagnostic results     Problem: Hemodynamic Status: Cardiac  Goal: Stable vital signs and fluid balance  Outcome: Progressing  Flowsheets (Taken 07/10/2022 2309)  Stable vital signs and fluid balance:   Assess signs and symptoms associated with cardiac rhythm changes   Monitor lab values     Problem: Renal Instability  Goal: Fluid and electrolyte balance are achieved/maintained  Outcome: Progressing  Flowsheets (Taken 07/10/2022 2309)  Fluid and electrolyte balance are achieved/maintained:   Monitor/assess lab values and report abnormal values   Observe for cardiac arrhythmias   Monitor for muscle weakness   Assess and reassess fluid and electrolyte status   Follow fluid restrictions/IV/PO parameters     Problem: Patient Receiving Advanced Renal Therapies  Goal: Therapy access site remains intact  Outcome: Progressing  Flowsheets (Taken 07/10/2022 2309)  Therapy access site remains intact:   Assess therapy access site   Change therapy access site dressing as needed     Problem: Fluid and Electrolyte  Imbalance/ Endocrine  Goal: Fluid and electrolyte balance are achieved/maintained  Outcome: Progressing  Flowsheets (Taken 07/10/2022 2309)  Fluid and electrolyte balance are achieved/maintained:   Monitor/assess lab values and report abnormal values   Observe for cardiac arrhythmias   Monitor for muscle weakness   Assess and reassess fluid and electrolyte status  Goal: Adequate hydration  Outcome: Progressing  Flowsheets (Taken 07/10/2022 2309)  Adequate hydration:   Assess mucus membranes, skin color, turgor, perfusion and presence of edema   Assess for peripheral, sacral, periorbital and abdominal edema   Monitor and assess vital signs and perfusion     Problem: Inadequate Gas Exchange  Goal: Patent Airway maintained  Outcome: Progressing  Flowsheets (Taken 07/10/2022 2309)  Patent airway maintained:   Position patient for maximum ventilatory efficiency   Suction secretions as needed   Reinforce use of ordered respiratory interventions (i.e. CPAP, BiPAP, Incentive Spirometer, Acapella, etc.)   Reposition patient every 2 hours and as needed unless able to self-reposition   Provide adequate fluid intake to liquefy secretions

## 2022-07-10 NOTE — Progress Notes (Signed)
Office: [phone]  Epic GroupChat: "[facility])"    Date Time: 07/10/22 @NOW   Patient Name: Shelby Barron, Shelby Barron      Problem List:      Admitted 6/10 for scheduled cystectomy and completion nephrectomy     RCC s/p b/l nephrectomy, Newly diagnosed urothelial CA  - Prior RCC s/p partial b/l nephrectomy  - ESRD on PD previously -> HD 05/2022  - 05/2022 newly diagnosed urothelial Ca  - Admitted 6/10 for planned cystectomy, completion nephrectomy     Small bowel enterotomy  - Complication of her 6/10 procedure, repaired by gen surg  - Left in discontinuity given significant bleeding  - 6/12 RTOR for re-anastomosis, closure. Wound vac in place.      Leukocytosis, abdominal fluid collections, ?PNA  - Worsening WBCs since 6/15  - CT- 6/18 - post-op changes, small ascites, small collection of fluid/gas anterior midline abdomen, may be post-op, but superimposed infection cannot be ruled out  - Sputum Cx GNRs     ID History:   - Above     Chronic Conditions:  - COPD  - CVA 2004  - T2DM  - RCC s/p partial b/l nephrectomy  - [name]  - Urothelial cancer     Allergies:  - Hives with sulfa    Interval Events/Subjective:     NAEO   Afebrile  WBCs slowly doentrending    Antimicrobials:   #12 abx     #5 mero  #3 mica     Prior  #5 cefepime  #5 flagyl  #4 zosyn    Estimated Creatinine Clearance: 12.6 mL/min (A) (based on SCr of 3.6 mg/dL (H)).      Assessment:   Shelby Barron is a 82 y.o. female with PMH RCC s/p partial b/l nephrectomy and more recent urothelial CA (05/2022) admitted for planned cystectomy/completion nephrectomy, c/b enterotomy now s/p repair and closure. We are consulted for rising leukocytosis.     Source is either pulmonary in setting of GNRs in her sputum (low quantity) vs abdominal, given her extensive surgery and abdominal collections on imaging, however these are small anterior midline collections without enhancement so may be post-op uninfected collections.      WBCs began  to trend down after initiation of mero. So will continue this for now and monitor her blood cultures. Sputum was low growth, no further work-up.     Plan:     - Continue meropenem  - Follow-up bcx  - Can likely de-escalate micafungin in coming days  - If leukocytosis persists in coming days may consider repeat abdominal imaging    Monitoring clinical response and untoward effects of high risk/IV antimicrobials        Lines:     Patient Lines/Drains/Airways Status       Active PICC Line / CVC Line / PIV Line / Drain / Airway / Intraosseous Line / Epidural Line / ART Line / Line / Wound / Pressure Ulcer / NG/OG Tube       Name Placement date Placement time Site Days    Temporary Catheter with Pigtail 06/30/22 Temporary Catheter Right 06/30/22  1700  --  9    Permacath Catheter - Tunneled 06/01/22 Internal Jugular Right 06/01/22  0956  -- 39    Peripheral IV 07/01/22 22 G Standard Anterior;Left Forearm 07/01/22  1300  Forearm  8    Midline IV 07/01/22 Anterior;Left Upper Arm 07/01/22  1315  Upper Arm  8  Rectal Tube Without balloon 07/07/22  1400  --  2    Peritoneal Dialysis Catheter Mid lower abdomen 05/26/22  2000  -- 44    Wound 06/28/22 Surgical Incision Abdomen Left GAUZE 4 X 4  10/PACK   06/28/22  0916  Abdomen  12    Wound 06/30/22 Surgical Incision Abdomen Other (Comment) WOUND VAC applied 06/30/22  1152  Abdomen  10    Feeding Tube ND 12 Fr. Right nare 07/05/22  1130  Right nare  5                    *I have performed a risk-benefit analysis and the patient needs a central line for access and IV medications      Review of Systems:   General ROS: negative for - chills, fevers, night sweats, weight loss   HEENT: negative for - blurry vision, sore throat, thrush   Respiratory ROS: negative for cough, SOB  Cardiovascular ROS: negative for - chest pain, palpitations   Gastrointestinal ROS: negative for - abdominal pain, nausea, vomiting, diarrhea  Genito-Urinary ROS: negative for - dysuria, urinary  frequency/urgency   Musculoskeletal ROS: negative for - joint pain, joint stiffness or muscle pain   Dermatological ROS: negative for - rash and skin lesion changes   Neurological ROS: negative for - confusion, headache, dizziness  Hematological ROS: negative for - bruising, bleeding   Psychological ROS: negative for - changes in mood    Physical Exam:     Vitals:    07/10/22 1230   BP: 124/57   Pulse: 80   Resp: 22   Temp:    SpO2: 98%         General Appearance: elderly, ill appearing  Neuro: unresponsive  HEENT: Nasal canula. HD cath  Lungs: diminished breath sounds anteriorly  Cardiac: tachycardic, regular  Abdomen: wound vac in place, midline abdominal incision  Extremities: no pedal edema  Skin: no rash      Family History:     Family History   Problem Relation Age of Onset    Cancer Mother     Heart failure Father     Cancer Maternal Aunt     Diabetes Maternal Aunt     Cancer Paternal Aunt        Social History:     Social History     Socioeconomic History    Marital status: Married     Spouse name: Not on file    Number of children: Not on file    Years of education: Not on file    Highest education level: Not on file   Occupational History    Not on file   Tobacco Use    Smoking status: Former    Smokeless tobacco: Never   Vaping Use    Vaping status: Never Used   Substance and Sexual Activity    Alcohol use: No    Drug use: No    Sexual activity: Not on file   Other Topics Concern    Not on file   Social History Narrative    Not on file     Social Determinants of Health     Financial Resource Strain: Not on file   Food Insecurity: No Food Insecurity (05/26/2022)    Hunger Vital Sign     Worried About Running Out of Food in the Last Year: Never true     Ran Out of Food in the Last Year: Never true  Transportation Needs: No Transportation Needs (05/26/2022)    PRAPARE - Therapist, art (Medical): No     Lack of Transportation (Non-Medical): No   Physical Activity: Not on file   Stress:  Not on file   Social Connections: Unknown (02/01/2022)    Received from [facility]    Social Network     Social Network: Not on file   Intimate Partner Violence: Not At Risk (05/26/2022)    Humiliation, Afraid, Rape, and Kick questionnaire     Fear of Current or Ex-Partner: No     Emotionally Abused: No     Physically Abused: No     Sexually Abused: No   Housing Stability: Unknown (05/26/2022)    Housing Stability Vital Sign     Unable to Pay for Housing in the Last Year: No     Number of Places Lived in the Last Year: Not on file     Unstable Housing in the Last Year: No       Allergies:     Allergies   Allergen Reactions    Metrizamide Shortness Of Breath and Nausea Only    Rosuvastatin Other (See Comments)     myopathy    myopathy   myopathy    myopathy myopathy      myopathy    myopathy, myopathy    Iodinated Contrast Media Other (See Comments)     Nausea and SOB per patient   On Dialysisi    Nausea and SOB per patient    Nausea and SOB per patient      Nausea and SOB per patient   Nausea and SOB per patient    Nausea and SOB per patient      Nausea and SOB per patient Nausea and SOB per patient      Nausea and SOB per patient Nausea and SOB per patient Nausea and SOB per patient      Nausea and SOB per patient  Nausea and SOB per patient Nausea and SOB per patient    Nausea and SOB per patient   Nausea and SOB per patient    Pioglitazone      Weight gain    Sulfa Antibiotics Hives     blister       Labs:     Lab Results   Component Value Date    WBC 23.01 (H) 07/10/2022    HGB 8.4 (L) 07/10/2022    HCT 25.2 (L) 07/10/2022    MCV 90.3 07/10/2022    PLT 239 07/10/2022     Lab Results   Component Value Date    CREAT 3.6 (H) 07/10/2022     Lab Results   Component Value Date    ALT 15 07/07/2022    AST 23 07/07/2022    ALKPHOS 69 07/07/2022    BILITOTAL 0.1 (L) 07/07/2022     Lab Results   Component Value Date    LACTATE 1.1 07/09/2022    LACTATE 2.0 05/26/2022       Microbiology:     Microbiology Results (last 15  days)       Procedure Component Value Units Date/Time    Culture, Blood, Aerobic And Anaerobic [[id number]] Collected: 07/07/22 0355    Order Status: Completed Specimen: Blood, Venous Updated: 07/10/22 0701     Culture Blood No growth at 3 days    Culture, Blood, Aerobic And Anaerobic [[id number]] Collected: 07/06/22 1837    Order Status: Completed Specimen:  Blood, Venous Updated: 07/10/22 0001     Culture Blood No growth at 3 days    Culture, Sputum and Lower Respiratory [[id number]]     Order Status: Sent Specimen: Sputum, Induced     Culture, Sputum and Lower Respiratory [[id number]]  (Abnormal) Collected: 07/04/22 1034    Order Status: Completed Specimen: Sputum, Suctioned Updated: 07/09/22 2228     Culture Respiratory Heavy growth of Mixed upper respiratory flora      Light growth of Gram negative rod     Comment: Questionable significance due to low quantity.  No further work.        Gram Stain Few WBCs      Rare Squamous epithelial cells      Few Mixed respiratory flora    Rectal Swab Carbapenemase Surveillance, PCR [[id number]]  (Normal) Collected: 06/29/22 1842    Order Status: Completed Specimen: Swab from Rectum Updated: 06/30/22 0150     IMP gene sequence Not Detected     VIM gene sequence Not Detected     NDM gene sequence Not Detected     KPC gene sequence Not Detected     OXA-48 gene sequence Not Detected    Narrative:      This test utilizes real-time PCR for the detection and differentiation of gene sequences associated with carbapenem-non-susceptibility in Enterobacterales (Enterobacteriaceae), Pseudomonas aeruginosa and Acinetobacter baumannii. Targets include blaKPC, blaNDM, blaVIM, blaOXA-48 (and blaOXA-181), and blaIMP (excluding IMP-7, IMP-13, IMP-14). Detection of these gene sequences does not indicate the presence of viable organisms. This assay does not report variants of these gene sequences. Mutations or polymorphisms may affect detection of current, new, or unknown variants, resulting in a  false negative result. This test is for infection prevention surveillance purposes only and should not be used to make treatment decisions. The performance of the Xpert Carba-R Assay has not been evaluated with rectal swab specimens from pediatric patients.            Rads:   No results found.    Thank you for this interesting consult. During the week, please reach out to author of the most recent ID note.     Signed by: Armen Pickup, MD

## 2022-07-10 NOTE — Plan of Care (Signed)
Problem: Renal Instability  Goal: Fluid and electrolyte balance are achieved/maintained  Outcome: Progressing  Flowsheets (Taken 07/10/2022 1004)  Fluid and electrolyte balance are achieved/maintained:   Monitor for muscle weakness   Follow fluid restrictions/IV/PO parameters   Monitor/assess lab values and report abnormal values   Assess and reassess fluid and electrolyte status   Observe for cardiac arrhythmias     Problem: Patient Receiving Advanced Renal Therapies  Goal: Therapy access site remains intact  Outcome: Progressing  Flowsheets (Taken 07/10/2022 1004)  Therapy access site remains intact:   Assess therapy access site   Change therapy access site dressing as needed

## 2022-07-10 NOTE — Progress Notes (Signed)
Pt transferred to unit 2805 via bed. Handoff of amio gtt done w/ bedside RN. No personal belongings for pt, only for visiting. Qora removed prior transfer. 2L NC.     Daughter, [name], updated on transfer.

## 2022-07-10 NOTE — Progress Notes (Signed)
[facility] PROGRESS NOTE  Myrla Halsted, x [phone] 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 s/p bilateral radical nephrectomies and  cystectomy on 06/28/22  - complicated by small bowel enterotomy and blood loss   New onset afib - cardiology following, now on amio gtt + metoprolol  High grade urothelial bladder CA: s/p bilateral nephrectomies and cystectomy c/b mall bowel enterotomy and blood loss (~1L) on 06/28/22  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 (approx 1L) - stable  Hypophosphatemia  - better  Encephalopathy - persists  Leukocytosis - slowly improving - neg blood cx    Recommendations:     HD today, UF 2 L as tolerated  Plan for repeat HD Monday  Will need new permcath with IR next week  Dose medicine for ESRD    Case discussed with: HD RN, pt, daughter, attending via SC    Henrietta Dine, MD  [facility]  [phone] (office)  X [phone] ([facility] Spectra-Link)      Subjective:  HD session yesterday UF 1.1 L, unable to reach goal UF due to tachycardia.     Review of Systems:   Unable to assess, patient is confused     Physical Exam:     Vitals:    07/10/22 0530 07/10/22 0635 07/10/22 0800 07/10/22 0848   BP:  139/63  141/63   Pulse: 89   82   Resp: (!) 25      Temp:   98.4 F (36.9 C)    TempSrc:   Oral    SpO2: 96%      Weight:       Height:           Intake and Output Summary (Last 24 hours) at Date Time    Intake/Output Summary (Last 24 hours) at 07/10/2022 8841  Last data filed at 07/10/2022 0400  Gross per 24 hour   Intake 704.53 ml   Output 1110 ml   Net  -405.47 ml       General: confused, on NC  Cardiovascular: regular rate and rhythm  Lungs: bilateral air entry  Abdomen: soft  Extremities: edema noted in her hands and thighs    Access: R quinton, old RIJ permacath    Meds:      Scheduled Meds: PRN Meds:    acetaminophen, 650 mg, Oral, 4 times per day  albuterol-ipratropium, 3 mL, Nebulization, Q6H SCH  insulin glargine, 7 Units, Subcutaneous, QAM  insulin lispro, 2-10 Units, Subcutaneous, Q4H SCH  meropenem, 500 mg, Intravenous, Q24H  metoprolol tartrate, 12.5 mg, per NG tube, Q12H SCH  micafungin, 100 mg, Intravenous, Q24H  pantoprazole, 40 mg, Intravenous, BID  thiamine, 100 mg, Intravenous, Q24H SCH          Continuous Infusions:   amiodarone 0.5 mg/min (07/10/22 0635)      benzocaine, 1 spray, TID PRN  dextrose, 15 g of glucose, PRN   Or  dextrose, 12.5 g, PRN   Or  dextrose, 12.5 g,  PRN   Or  glucagon (rDNA), 1 mg, PRN  diphenhydrAMINE, 25 mg, Q6H PRN  labetalol, 10 mg, Q2H PRN  lidocaine 2% jelly, , Once PRN  naloxone, 0.2 mg, PRN  oxyCODONE, 5 mg, Q6H PRN  phenol, 1 spray, Q2H PRN              Labs:     Recent Labs   Lab 07/10/22  0136 07/09/22  1824 07/09/22  0638 07/09/22  0208   WBC 23.01* 27.83*  --  30.24*   Hemoglobin 8.4* 9.3* 6.7* 6.9*   Hematocrit 25.2* 27.3* 20.4* 20.8*   Platelet Count 239 248  --  270     Recent Labs   Lab 07/10/22  0136 07/09/22  0207 07/08/22  0610 07/07/22  2120 07/06/22  1545 07/06/22  0341   Sodium 141 141 140 134*  More results in Results Review 139   Potassium 3.6 4.2 4.1 4.0  More results in Results Review 4.5   Chloride 103 106 107 103  More results in Results Review 106   CO2 23 20 19 18   More results in Results Review 21   BUN 41* 97* 99* 94*  More results in Results Review 79*   Creatinine 3.6* 6.0* 5.6* 5.3*  More results in Results Review 5.3*   Calcium 8.0 8.5 8.4 8.2  More results in Results Review 7.7*   Albumin  --   --   --  1.4*  --  1.3*   Phosphorus 4.9* 6.6* 6.2* 6.5*  --  4.2   Magnesium 1.9 2.2 2.2 2.3   More results in Results Review 2.3   Glucose 124* 181* 179* 277*  More results in Results Review 210*   GFR 11.9* 6.4* 7.0* 7.5*  More results in Results Review 7.5*   More results in Results Review = values in this interval not displayed.           Signed by: Henrietta Dine, MD, MD

## 2022-07-10 NOTE — Plan of Care (Addendum)
Non-verbal, not following commands nor tracking. PERRLA. Movement to painful stimuli at RUE and RLE. No movement to pain on other side. Gag and cough there. Groaning. PRN oxycodone and scheduled tylenol given.    Sinus Arrhythmia on tele HR 80s. SBP WDL 130-140s. SCDs on. Pulses palpable. Afebrile. Pt on amio gtt.    2L NC. Coarse LS. Thick secretions orally. Pt also more tachypneic and noticeable coughing. CXR done.    NPO through the night. TF restarted per MD this afternoon 1300. BG Q4. No s/s coverage. Loose BM. FMD in place. Anuric. Pt had HD this AM w/ 2L fluid removal.    Repositioning Q2H and as needed.    Problem: Moderate/High Fall Risk Score >5  Goal: Patient will remain free of falls  Outcome: Progressing  Flowsheets (Taken 07/10/2022 0800)  Moderate Risk (6-13):   MOD-Consider activation of bed alarm if appropriate   MOD-Apply bed exit alarm if patient is confused   MOD-Remain with patient during toileting   MOD-Re-orient confused patients   MOD-Perform dangle, stand, walk (DSW) prior to mobilization   MOD-Request PT/OT consult order for patients with gait/mobility impairment   LOW-Fall Interventions Appropriate for Low Fall Risk   MOD-Floor mat at bedside (where available) if appropriate   MOD-Consider a move closer to Nurses Station   MOD-include family in multidisciplinary POC discussions     Problem: Compromised Sensory Perception  Goal: Sensory Perception Interventions  Outcome: Progressing  Flowsheets (Taken 07/09/2022 2000 by Andy Gauss, RN)  Sensory Perception Interventions: Offload heels, Pad bony prominences, Reposition q 2hrs/turn Clock, Q2 hour skin assessment under devices if present     Problem: Compromised Moisture  Goal: Moisture level Interventions  Outcome: Progressing  Flowsheets (Taken 07/09/2022 2000 by Murrell Redden Cam Nhung, RN)  Moisture level Interventions: Moisture wicking products, Moisture barrier cream     Problem: Compromised Activity/Mobility  Goal:  Activity/Mobility Interventions  Outcome: Progressing  Flowsheets (Taken 07/09/2022 2000 by Murrell Redden Cam Nhung, RN)  Activity/Mobility Interventions: Pad bony prominences, TAP Seated positioning system when OOB, Promote PMP, Reposition q 2 hrs / turn clock, Offload heels     Problem: Compromised Nutrition  Goal: Nutrition Interventions  Outcome: Progressing     Problem: Compromised Friction/Shear  Goal: Friction and Shear Interventions  Outcome: Progressing     Problem: Inadequate Airway Clearance  Goal: Patent Airway maintained  Outcome: Progressing  Flowsheets (Taken 07/09/2022 2311 by Murrell Redden Cam Nhung, RN)  Patent airway maintained:   Position patient for maximum ventilatory efficiency   Provide adequate fluid intake to liquefy secretions   Suction secretions as needed   Reposition patient every 2 hours and as needed unless able to self-reposition  Goal: Normal respiratory rate/effort achieved/maintained  Outcome: Progressing     Problem: Infection Prevention  Goal: Free from infection  Outcome: Progressing  Flowsheets (Taken 07/08/2022 1406 by Roberto Scales, RN)  Free from infection:   Monitor/assess vital signs   Encourage/assist patient to turn, cough and perform deep breathing every 2 hours   Assess incision for evidence of healing   Assess for signs and symptoms of infection   Monitor/assess lab values and report abnormal values   Monitor/assess output from surgical drain if present     Problem: Impaired Mobility  Goal: Mobility/Activity is maintained at optimal level for patient  Outcome: Progressing  Flowsheets (Taken 07/09/2022 2311 by Murrell Redden Cam Nhung, RN)  Mobility/activity is maintained at optimal level for patient:   Encourage independent activity per  ability   Consult/collaborate with Physical Therapy and/or Occupational Therapy     Problem: Safety  Goal: Patient will be free from injury during hospitalization  Outcome: Progressing  Flowsheets (Taken 07/08/2022 1406 by Roberto Scales,  RN)  Patient will be free from injury during hospitalization:   Assess patient's risk for falls and implement fall prevention plan of care per policy   Use appropriate transfer methods   Provide and maintain safe environment   Ensure appropriate safety devices are available at the bedside   Include patient/ family/ care giver in decisions related to safety   Hourly rounding   Assess for patients risk for elopement and implement Elopement Risk Plan per policy   Provide alternative method of communication if needed (communication boards, writing)  Goal: Patient will be free from infection during hospitalization  Outcome: Progressing  Flowsheets (Taken 07/08/2022 1406 by Roberto Scales, RN)  Free from Infection during hospitalization:   Assess and monitor for signs and symptoms of infection   Monitor lab/diagnostic results   Monitor all insertion sites (i.e. indwelling lines, tubes, urinary catheters, and drains)   Encourage patient and family to use good hand hygiene technique     Problem: Pain  Goal: Pain at adequate level as identified by patient  Outcome: Progressing  Flowsheets (Taken 07/10/2022 0530 by Murrell Redden Cam Nhung, RN)  Pain at adequate level as identified by patient:   Evaluate if patient comfort function goal is met   Evaluate patient's satisfaction with pain management progress   Offer non-pharmacological pain management interventions     Problem: Side Effects from Pain Analgesia  Goal: Patient will experience minimal side effects of analgesic therapy  Outcome: Progressing     Problem: Hemodynamic Status: Cardiac  Goal: Stable vital signs and fluid balance  Outcome: Progressing     Problem: Renal Instability  Goal: Fluid and electrolyte balance are achieved/maintained  Outcome: Progressing  Flowsheets (Taken 07/09/2022 2311 by Murrell Redden Cam Nhung, RN)  Fluid and electrolyte balance are achieved/maintained:   Monitor/assess lab values and report abnormal values   Observe for cardiac arrhythmias    Assess and reassess fluid and electrolyte status   Monitor for muscle weakness     Problem: Patient Receiving Advanced Renal Therapies  Goal: Therapy access site remains intact  Outcome: Progressing     Problem: Fluid and Electrolyte Imbalance/ Endocrine  Goal: Fluid and electrolyte balance are achieved/maintained  Outcome: Progressing  Goal: Adequate hydration  Outcome: Progressing     Problem: Diabetes: Glucose Imbalance  Goal: Blood glucose stable at established goal  Outcome: Progressing  Flowsheets (Taken 07/09/2022 0032 by Murrell Redden Cam Nhung, RN)  Blood glucose stable at established goal:   Monitor lab values   Monitor intake and output.  Notify LIP if urine output is < 30 mL/hour.   Include patient/family in decisions related to nutrition/dietary selections   Monitor/assess vital signs   Coordinate medication administration with meals, as indicated   Assess for hypoglycemia /hyperglycemia   Follow fluid restrictions/IV/PO parameters   Ensure appropriate consults are obtained (Nutrition, Diabetes Education, and Case Management)   Ensure appropriate diet and assess tolerance   Ensure adequate hydration     Problem: Inadequate Gas Exchange  Goal: Patent Airway maintained  Outcome: Progressing  Flowsheets (Taken 07/09/2022 2311 by Murrell Redden Cam Nhung, RN)  Patent airway maintained:   Position patient for maximum ventilatory efficiency   Provide adequate fluid intake to liquefy secretions   Suction secretions as needed  Reposition patient every 2 hours and as needed unless able to self-reposition  Goal: Adequate oxygenation and improved ventilation  Outcome: Progressing  Flowsheets (Taken 07/05/2022 0325 by Raynelle Chary, RN)  Adequate oxygenation and improved ventilation:   Assess lung sounds   Monitor SpO2 and treat as needed   Increase activity as tolerated/progressive mobility   Plan activities to conserve energy: plan rest periods     Problem: Artificial Airway  Goal: Endotracheal tube will  be maintained  Outcome: Progressing

## 2022-07-10 NOTE — Progress Notes (Signed)
Ultrafiltration tx ended.  R temporary catheter  access is optimally working post dialysis. Daughter at bedside.  Able to removed Net Fluid of 2L.  Reports were given to Primary nurse.       07/10/22 1300   Treatment Summary   Time Off Machine 1255   Duration of Treatment (Hours) 3   Treatment Type 1:1   Dialyzer Clearance Moderately streaked   Fluid Volume Off (mL) 2400   Prime Volume (mL) 200   Rinseback Volume (mL) 200   Fluid Given: Normal Saline (mL) 0   Fluid Given: PRBC  0 mL   Fluid Given: Albumin (mL) 0   Fluid Given: Other (mL) 0   Total Fluid Given 400   Hemodialysis Net Fluid Removed 2000   Post Treatment Assessment   Post-Treatment Weight (Kg) -2   Patient Response to Treatment fairly tolerated   Additional Dialyzer Used 0   Temporary Catheter with Pigtail 06/30/22 Temporary Catheter Right   Placement Date/Time: 06/30/22 1700   Access Type: Temporary Catheter  Orientation: Right  Central Line Infection Prevention Education provided?: Yes  Hand Hygiene: Chlorhexidine;Alcohol based hand scrub  Line cart used?: Yes  Line kit used?: Yes  Maxi...   Line necessity reviewed? Apheresis/hemodialysis   Site Assessment Clean;Dry;Intact   Catheter Lumen Volume Venous 1.2 mL   Catheter Lumen Volume Arterial 1.2 mL   Tego/Curos Caps on Catheter Yes   Line Care Connections checked and tightened   Dressing Type Transparent;Biopatch   Dressing Status Clean;Dry;Intact   Dressing Change Due 07/13/22   Vitals   Temp 97.8 F (36.6 C)   Heart Rate 79   Resp Rate 20   BP 134/63   SpO2 100 %   O2 Device Nasal cannula   Assessment   Mental Status Other (Comment);Non-Responsive;Does not follow commands   Cardiac Regularity Irregular   Cardiac Rhythm Sinus arrhythmia   Bilateral Breath Sounds Diminished   R Breath Sounds Diminished   L Breath Sounds Diminished   Edema  WDL   General Skin Color Appropriate for ethnicity   Skin Condition/Temp Return to [facility]   Abdomen Inspection Distended   Mobility Bed   Pain Assessment    Charting Type Reassessment   Pain Scale Used CPOT   Education   Person taught Other (Comments)  (Daughter)   Knowledge basis Minimal   Topics taught Procedure   Teaching Tools Explain   Reponse Needs Reinforcement   Bedside Nurse Communication   Name of bedside RN - post dialysis Raynelle Fanning, RN

## 2022-07-10 NOTE — Progress Notes (Signed)
Arrived in ICU, daughter at bedside.  Pt  not following commands. Received report from ICU primary RN pre-dialysis.  R temporary catheter access is optimally working. Nephrologist ordered Ultrafiltration tx. Timeouts and safety checks done. Will closely monitor the pt.       07/10/22 0945   Bedside Nurse Communication   Name of bedside RN - pre dialysis Joneen Roach, RN   Treatment Initiation- With Dialysis Precautions   Time Out/Safety Check Completed Yes   Consent for HD signed for this hospitalization (Date) 06/01/22   Consent for HD signed for this hospitalization (Time) 1516   Blood Consent Verified N/A   Dialysis Precautions All Connections Secured;Saline Line Double Clamped;Venous Parameters Set;Arterial Parameters Set;Air Foam Detecctor Engaged   Dialysis Treatment Type ICU/CCU;Routine   Is patient diabetic? Yes   RO/Hemodialysis Cabin crew   Is Total Chlorine less than 0.1 ppm? Yes   Orignial Total Chlorine Testing Time 0835   At 4 Hour Total Chlorine Testing Time 1235   RO/Hemodialysis Arts development officer Number 6    Machine Serial Number [id number]   RO # 22   RO Serial # [id number]   pH 7.2   Pressure Test Verified Yes   Alarms Verified Passed   Machine Temperature 98.6 F (37 C)   Alarms Verified Yes   Na+ mEq (Machine) 138 mEq   Bicarb mEq (Machine) 35 mEq   Hemodialysis Conductivity (Machine) 13.8   Hemodialysis Conductivity (Meter) 13.8   Dialyzer Lot Number [id number]   Tubing Lot Number [id number]   RO Machine Log Completed Yes   Hepatitis Status   HBsAg (Antigen) Result Negative   HBsAg Date Drawn 07/03/22   HBsAg Repeat Draw Due Date 07/31/22   Dialysis Weight   Pre-Treatment Weight (Kg) 94.2   Scale Type ICU Bed Scale   Vitals   Temp 98.6 F (37 C)   Heart Rate 78   Resp Rate (!) 47   BP 120/57   SpO2 96 %   O2 Device Nasal cannula   Assessment   Mental Status Does not follow commands   Cardiac Rhythm Sinus arrhythmia   Respiratory Pattern Tachypneic   R Breath Sounds  Diminished   L Breath Sounds Diminished   Edema  WDL   General Skin Color Appropriate for ethnicity   Abdomen Inspection Distended   Mobility Bed   Temporary Catheter with Pigtail 06/30/22 Temporary Catheter Right   Placement Date/Time: 06/30/22 1700   Access Type: Temporary Catheter  Orientation: Right  Central Line Infection Prevention Education provided?: Yes  Hand Hygiene: Chlorhexidine;Alcohol based hand scrub  Line cart used?: Yes  Line kit used?: Yes  Maxi...   Line necessity reviewed? Apheresis/hemodialysis   Site Assessment Clean;Dry;Intact   Catheter Lumen Volume Venous 1.2 mL   Catheter Lumen Volume Arterial 1.2 mL   Tego/Curos Caps on Catheter Yes   Pain Assessment   Charting Type Assessment   Pain Scale Used CPOT   Hemodialysis Comments   Pre-Hemodialysis Comments Timeout and safety checks done

## 2022-07-10 NOTE — Progress Notes (Signed)
4 eyes in 4 hours pressure injury assessment note:      Completed with: Gina PCT  Unit & Time admitted: Unit 28; 1950             Bony Prominences: Check appropriate box; if wound is present enter wound assessment in LDA     Occiput:                 [x] WNL  []  Wound present  Face:                     [x] WNL  []  Wound present  Ears:                      [x] WNL  []  Wound present  Spine:                    [x] WNL  []  Wound present  Shoulders:             [x] WNL  []  Wound present  Elbows:                  [x] WNL  []  Wound present  Sacrum/coccyx:     [x] WNL  []  Wound present  Ischial Tuberosity:  [x] WNL  []  Wound present  Trochanter/Hip:      [x] WNL  []  Wound present  Knees:                   [x] WNL  []  Wound present  Ankles:                   [x] WNL  []  Wound present  Heels:                    [x] WNL  []  Wound present  Other pressure areas:  []  Wound location       Device related: []  Device name:         LDA completed if wound present: yes/no  Consult WOCN if necessary    Other skin related issues, ie tears, rash, etc, document in Integumentary flowsheet

## 2022-07-10 NOTE — Respiratory Progress Note (Signed)
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  Initial assessment and changes made per protocol can be found in the progress note section of the patient chart.    07/07/22 1729    07/07/22 2000  Resp Re-Assess Adult (RT Use Only)  2 times daily (RT)       07/07/22 1729    Unscheduled  Suctioning  As needed      Question:  Suction Type  Answer:  Nasal Pharyngeal    07/06/22 0134                   IP Meds - Nasal and Inhaled (From admission, onward)      Start     Stop Status Route Frequency Ordered    07/07/22 1645  albuterol-ipratropium (DUO-NEB) 2.5-0.5(3) mg/3 mL nebulizer 3 mL         -- Dispensed NEBULIZATION RT - Every 6 hours scheduled 07/07/22 1607             Subjective: encephalopathy; unable to manage secretion PT able to take deep breath? Yes Incentive Spirometry Goal (mL): 1000 mL  Incentive Spirometry Achieved (mL): 0 mL          Airway: Natural   Mobility: Non-ambulatory  CXR: Interstitial markings have slightly increased, suggesting minimal edema. No  other change    Cough Effort: Moderate Secretion Amount: Moderate    Sputum Consistency: Thick     Can clear secretions with cough? Yes  Can clear secretions with suctioning? N/A     Social History     Tobacco Use   Smoking Status Former   Smokeless Tobacco  Never        Breath Sounds:  Bilateral Breath Sounds: Diminished (course)  R Breath Sounds: Rhonchi  L Breath Sounds: Rhonchi    Heart Rate: 81 Resp Rate: 18  SpO2: 99 % O2 Device: Nasal cannula  FiO2: 28 %  O2 Flow Rate (L/min): 2 L/min    Home regimen:  Home Treatments: albuterol mdi, anoro  Home Oxygen: none   Home CPAP/BiLevel: none    Criteria for therapy:  Secretion Clearance: None indicated  Lung Expansion: None indicated  Medications: Hx of COPD, Asthma, Bronchitis or other documented RAD    Recommendations/Interventions:  Recommendations/Interventions: Bronchodilators     Expected Outcomes:  Secretion: Improved breath sound     Meds: Return to baseline home regimen      Re-Evaluation:  Follow-up Date:   Improving with Therapy: Yes    Plan of Care Recommendations:  Plan of Care: Change nebs to Q6 WA with a PRN

## 2022-07-10 NOTE — Progress Notes (Signed)
[facility]  PROGRESS NOTE      Patient: Shelby Barron  Date:07/08/2022   LOS: 12 Days  Admission Date: 06/28/2022   MRN: [id number]  Attending: Ester Rink, DO  When on service as the attending, please contact me on Epic Secure Chat from 7 AM - 7 PM for non-urgent issues. For urgent matters use XTend page from 7 AM - 7 PM.       ASSESSMENT/PLAN     Shelby Barron is a 82 y.o. female admitted with Bladder cancer    Interval Summary: [name], [name], type 2 diabetes, renal cell carcinoma status post partial bilateral nephrectomy, [name], new diagnosis of urothelial cancer initally presented on 6/10 for elective cystectomy and completion nephrectomy, [name], [name], extubated and off pressors since 6/13.     She is hypoactive and delirious. Surgery recommended NGT and CTAP today. Plan to start TF today. CTH pending.  Has [name] T/Th/Sat. Nephrology following     Developed new onset afib RVR today during HD. Stopped norvasc switched to metoprolol and gave additional IV metoprolol. Consulted cardiology. Discussed with ICU requested a consult given deterioration. Recommended repeat blood cultures and broadening antibiotics to meropenem. Palliative care consulted.    Rising WBC. Awaiting blood cultures. Patient tachypneic CXR with pulm edema. Discussed with nephrology giving 3 mg IV bumex. Had dark emesis. Vitals stable. TFs held, started IV PPI, follow AM labs. Discussed with GI and surgery.    Again afib RVR HD canceled, started amiodarone infusion.    Patient Active Hospital Problem List:  #S/P Cystectomy and completion nephrectomy complicated by small bowel enterotomy   #Mixed shock - hemorrhagic vs septic: Resolved  - 6/10 small bowel enteretomy with 1L EBL. Went to the OR on June 12 for small bowel anastomosis, closure of mesenteric defect, closure of abdominal wall  with wound VAC application  - Failed SLP eval yesterday. Will plan to initiate on tube feeds per nutrition recs  -Wound VAC changed 6/15  - Surgery consulted, recommend CTAP with PO contrast to eval for intra-abdominal source of infection given uptrending WBC. Surgery ordered pre-medication with steroid/benadryl given h/o contrast allergy  - trend CBC, transfuse Hgb<7. Transfused 3 units PRBC in OR. Received another unit on 6/11 and 6/12  - TTE as part of preop cardiac clearance which showed Mild aortic stenosis (AVA 1.7), grade 1 diastolic dysfunction, normal LV/RV systolic function  - ID consulted, continue IV cefepime and flagyl     #History of CVA  #Encephalopathy/delirium  - CT head pending  - Hold home plavix  - Adhere to day/night cycles and administer constant reorientation.   - CAM ICU monitoring with early initiation of atypical antipsychotics as needed for control of agitated delirium.   - Out of bed to chair with PT/OT consult as indicated.      #Acute hypoxemic respiratory failure  #COPD  Extubated on 6/13, currently on 2L NC   Maintain SaO2 > 92%     #ESRD  HD Tues/Thurs/Sat  Will need eventual permcath exchange this week     #HTN  Started on norvasc  Off nicardipine gtt, PRN IV labetalol for SBP > 180     #Renal cell carcinoma  #Urothelial carcinoma  #Thrombocytopenia  - VTE ppx: SCDs, subcutaneous heparin     #Diabetes Mellitus type 2  -  Sliding scale insulin          Nutrition: NPO  Malnutrition Documentation    Moderate Malnutrition related to inadequate nutritional intake in the setting of acute illness as evidenced by <50% EER > 5 days, mild muscle losses (temporalis, deltoid) - new                 Patient has BMI=Body mass index is 37.58 kg/m.  Diagnosis: Obesity based on BMI criteria                   Recent Labs   Lab 07/10/22  0136 07/09/22  1824 07/09/22  0638 07/09/22  0208   Hemoglobin 8.4* 9.3* 6.7* 6.9*   Hematocrit 25.2* 27.3* 20.4* 20.8*   MCV 90.3 88.9  --  93.3   WBC 23.01* 27.83*   --  30.24*   Platelet Count 239 248  --  270     Recent Labs   Lab 06/02/22  0514   Iron 62   TIBC 145*   Iron Saturation 43       Code Status: Full Code    Dispo: pending improved mental status    Family Contact: none    DVT Prophylaxis:   Current Facility-Administered Medications (Includes Only Anticoagulants, Misc. Hematological)   Medication Dose Route Last Admin    albumin human 25 % bag 25 g  100 mL Intravenous            CHART  REVIEW & DISCUSSION     The following chart items were reviewed as of 11:17 AM on 07/10/22:  []  Lab Results []  Imaging Results   []  Problem List  []  Current Orders []  Current Medications  []  Allergies  []  Code Status []  Previous Notes   []  SDoH    The management and plan of care for this patient was discussed with the following specialty consultants:  []  Cardiology  []  Gastroenterology                 []  Infectious Disease  []  Pulmonology []  Neurology                [x]  Nephrology  []  Neurosurgery []  Orthopedic Surgery  []  Heme/Onc  []  General Surgery []  Psychiatry                                   []  Palliative    SUBJECTIVE     [name] moaning, not responding appropriately   Family updated bedside    MEDICATIONS     Current Facility-Administered Medications   Medication Dose Route Frequency    acetaminophen  650 mg Oral 4 times per day    albuterol-ipratropium  3 mL Nebulization Q6H SCH    insulin glargine  7 Units Subcutaneous QAM    insulin lispro  2-10 Units Subcutaneous Q4H SCH    meropenem  500 mg Intravenous Q24H    metoprolol tartrate  12.5 mg per NG tube Q12H [facility]    micafungin  100 mg Intravenous Q24H    pantoprazole  40 mg Intravenous BID    thiamine  100 mg Intravenous Q24H SCH       PHYSICAL EXAM     Vitals:    07/10/22 1115   BP: 125/58   Pulse: 77   Resp: (!) 43   Temp:    SpO2: 100%       Temperature:  Temp  Min: 97.6 F (36.4 C)  Max: 99 F (37.2 C)  Pulse: Pulse  Min: 74  Max: 132  Respiratory: Resp  Min: 19  Max: 47  Non-Invasive BP: BP  Min: 104/55  Max:  186/79  Pulse Oximetry SpO2  Min: 55 %  Max: 100 %    Intake and Output Summary (Last 24 hours) at Date Time    Intake/Output Summary (Last 24 hours) at 07/10/2022 1117  Last data filed at 07/10/2022 0900  Gross per 24 hour   Intake 944.53 ml   Output 1110 ml   Net -165.47 ml         GEN APPEARANCE: Normal;  A&OX0  HEENT: PERLA; EOMI; Conjunctiva Clear  NECK: Supple; No bruits  CVS: RRR, S1, S2; No M/G/R  LUNGS: CTAB; No Wheezes; No Rhonchi: No rales  ABD: Soft; No TTP; + Normoactive BS  EXT: No edema; Pulses 2+ and intact  NEURO: moving UEs spontaneously, moaning intermittently unable to cooperate fully in MS exam  MENTAL STATUS:     LABS     Recent Labs   Lab 07/10/22  0136 07/09/22  1824 07/09/22  0638 07/09/22  0208   WBC 23.01* 27.83*  --  30.24*   RBC 2.79* 3.07*  --  2.23*   Hemoglobin 8.4* 9.3* 6.7* 6.9*   Hematocrit 25.2* 27.3* 20.4* 20.8*   MCV 90.3 88.9  --  93.3   Platelet Count 239 248  --  270       Recent Labs   Lab 07/10/22  0136 07/09/22  0207 07/08/22  0610 07/07/22  2120 07/07/22  0358   Sodium 141 141 140 134* 138   Potassium 3.6 4.2 4.1 4.0 3.8   Chloride 103 106 107 103 104   CO2 23 20 19 18 21    BUN 41* 97* 99* 94* 68*   Creatinine 3.6* 6.0* 5.6* 5.3* 4.2*   Glucose 124* 181* 179* 277* 230*   Calcium 8.0 8.5 8.4 8.2 8.1   Magnesium 1.9 2.2 2.2 2.3 2.1       Recent Labs   Lab 07/07/22  2120 07/06/22  0341   ALT 15  --    AST (SGOT) 23  --    Bilirubin, Total 0.1*  --    Albumin 1.4* 1.3*   Alkaline Phosphatase 69  --                      Microbiology Results (last 15 days)       Procedure Component Value Units Date/Time    Culture, Blood, Aerobic And Anaerobic [[id number]] Collected: 07/07/22 0355    Order Status: Completed Specimen: Blood, Venous Updated: 07/10/22 0701     Culture Blood No growth at 3 days    Culture, Blood, Aerobic And Anaerobic [[id number]] Collected: 07/06/22 1837    Order Status: Completed Specimen: Blood, Venous Updated: 07/10/22 0001     Culture Blood No growth at 3 days     Culture, Sputum and Lower Respiratory [[id number]]     Order Status: Sent Specimen: Sputum, Induced     Culture, Sputum and Lower Respiratory [[id number]]  (Abnormal) Collected: 07/04/22 1034    Order Status: Completed Specimen: Sputum, Suctioned Updated: 07/09/22 2228     Culture Respiratory Heavy growth of Mixed upper respiratory flora      Light growth of Gram negative rod     Comment: Questionable significance due to low quantity.  No further work.  Gram Stain Few WBCs      Rare Squamous epithelial cells      Few Mixed respiratory flora    Rectal Swab Carbapenemase Surveillance, PCR [[id number]]  (Normal) Collected: 06/29/22 1842    Order Status: Completed Specimen: Swab from Rectum Updated: 06/30/22 0150     IMP gene sequence Not Detected     VIM gene sequence Not Detected     NDM gene sequence Not Detected     KPC gene sequence Not Detected     OXA-48 gene sequence Not Detected    Narrative:      This test utilizes real-time PCR for the detection and differentiation of gene sequences associated with carbapenem-non-susceptibility in Enterobacterales (Enterobacteriaceae), Pseudomonas aeruginosa and Acinetobacter baumannii. Targets include blaKPC, blaNDM, blaVIM, blaOXA-48 (and blaOXA-181), and blaIMP (excluding IMP-7, IMP-13, IMP-14). Detection of these gene sequences does not indicate the presence of viable organisms. This assay does not report variants of these gene sequences. Mutations or polymorphisms may affect detection of current, new, or unknown variants, resulting in a false negative result. This test is for infection prevention surveillance purposes only and should not be used to make treatment decisions. The performance of the Xpert Carba-R Assay has not been evaluated with rectal swab specimens from pediatric patients.             RADIOLOGY     XR Chest AP Portable    Result Date: 07/07/2022   There is mild interstitial prominence suggesting mild congestive heart failure/fluid overload in the  setting of shortness of breath [name], MD 07/07/2022 12:32 PM    XR Chest AP Portable    Result Date: 07/06/2022   Hypoventilation with basilar atelectasis. Charlott Rakes, MD 07/06/2022 3:35 PM    CT Abdomen Pelvis W IV And PO Cont    Result Date: 07/06/2022  1.Postoperative changes from bilateral nephrectomy and cystectomy. 2.Small amount of ascites including some areas which appear loculated. Small collections of fluid and gas in the anterior midline abdomen may be postoperative but superimposed infection is not excluded. No rim-enhancing fluid collections or extravasation of oral contrast to suggest anastomotic leak. Demetrios Isaacs, MD 07/06/2022 5:51 AM    CT Head WO Contrast    Result Date: 07/06/2022   1.No acute intracranial abnormality is seen. Leandro Reasoner, MD 07/06/2022 3:56 AM    XR Abdomen AP    Result Date: 07/05/2022  Enteric tube terminates in the distal stomach. Elease Etienne, DO 07/05/2022 12:24 PM    XR Chest AP Portable    Result Date: 06/30/2022  1. Lines and tubes in adequate position. No pneumothorax. 2. Trace left pleural effusion and bibasilar atelectasis. Jasmine December D'Heureux, MD 06/30/2022 4:53 PM    XR Chest AP Portable    Result Date: 06/30/2022  Catheters as described. Pankaj Dominica, MD 06/30/2022 2:07 PM    XR Chest AP Portable    Result Date: 06/28/2022   Right mainstem bronchial intubation. The tube should be withdrawn about 4 cm. Dr. Tildon Husky is aware. Wynema Birch, MD 06/28/2022 4:56 PM    XR OR MISCOUNT    Result Date: 06/28/2022   No unexpected foreign body. These critical results were discussed with and acknowledged by Angie R., the circulating nurse, who will communicate the findings to Everardo All, MD on 06/28/2022 3:02 PM. Bosie Helper, MD 06/28/2022 3:03 PM   Echo Results       None          No results found for this or any  previous visit.    Signed,  Ester Rink, DO  11:17 AM 07/10/2022

## 2022-07-10 NOTE — Treatment Plan (Cosign Needed)
4 eyes in 4 hours pressure injury assessment note:      Completed with:   Unit & Time admitted:              Bony Prominences: Check appropriate box; if wound is present enter wound assessment in LDA     Occiput:                 [x] WNL  []  Wound present  Face:                     [x] WNL  []  Wound present  Ears:                      [x] WNL  []  Wound present  Spine:                    [x] WNL  []  Wound present  Shoulders:             [x] WNL  []  Wound present  Elbows:                  [x] WNL  []  Wound present  Sacrum/coccyx:     [x] WNL  [x]  Wound present  Ischial Tuberosity:  [x] WNL  []  Wound present  Trochanter/Hip:      [x] WNL  []  Wound present  Knees:                   [x] WNL  []  Wound present  Ankles:                   [x] WNL  []  Wound present  Heels:                    [x] WNL  []  Wound present  Other pressure areas:  []  Wound location       Device related: []  Device name: Wound Vac for Abdomen        LDA completed if wound present: yes/no  Consult WOCN if necessary    Other skin related issues, ie tears, rash, etc, document in Integumentary flowsheet

## 2022-07-10 NOTE — Progress Notes (Signed)
[facility]  PROGRESS NOTE      Patient: Shelby Barron  Date:07/08/2022   LOS: 12 Days  Admission Date: 06/28/2022   MRN: [id number]  Attending: Ester Rink, DO  When on service as the attending, please contact me on Epic Secure Chat from 7 AM - 7 PM for non-urgent issues. For urgent matters use XTend page from 7 AM - 7 PM.       ASSESSMENT/PLAN     Shelby Barron is a 82 y.o. female admitted with Bladder cancer    Interval Summary: [name], [name], type 2 diabetes, renal cell carcinoma status post partial bilateral nephrectomy, [name], new diagnosis of urothelial cancer initally presented on 6/10 for elective cystectomy and completion nephrectomy, [name], [name], extubated and off pressors since 6/13.     She is hypoactive and delirious. Surgery recommended NGT and CTAP today. Plan to start TF today. CTH pending.  Has [name] T/Th/Sat. Nephrology following     Developed new onset afib RVR today during HD. Stopped norvasc switched to metoprolol and gave additional IV metoprolol. Consulted cardiology. Discussed with ICU requested a consult given deterioration. Recommended repeat blood cultures and broadening antibiotics to meropenem. Palliative care consulted.    Rising WBC. Awaiting blood cultures. Patient tachypneic CXR with pulm edema. Discussed with nephrology giving 3 mg IV bumex. Had dark emesis. Vitals stable. TFs held, started IV PPI, follow AM labs. Discussed with GI and surgery.    No further episodes of Afib RVR now on amiodarone gtt and PO metoprolol. Hgb stable. Repeating CXR.    Patient Active Hospital Problem List:  #S/P Cystectomy and completion nephrectomy complicated by small bowel enterotomy   #Mixed shock - hemorrhagic vs septic: Resolved  - 6/10 small bowel enteretomy with 1L EBL. Went to the OR on June 12 for small bowel anastomosis, closure of  mesenteric defect, closure of abdominal wall with wound VAC application  - Failed SLP eval yesterday. Will plan to initiate on tube feeds per nutrition recs  -Wound VAC changed 6/15  - Surgery consulted, recommend CTAP with PO contrast to eval for intra-abdominal source of infection given uptrending WBC. Surgery ordered pre-medication with steroid/benadryl given h/o contrast allergy  - trend CBC, transfuse Hgb<7. Transfused 3 units PRBC in OR. Received another unit on 6/11 and 6/12  - TTE as part of preop cardiac clearance which showed Mild aortic stenosis (AVA 1.7), grade 1 diastolic dysfunction, normal LV/RV systolic function  - ID consulted, continue IV cefepime and flagyl     #History of CVA  #Encephalopathy/delirium  - CT head pending  - Hold home plavix  - Adhere to day/night cycles and administer constant reorientation.   - CAM ICU monitoring with early initiation of atypical antipsychotics as needed for control of agitated delirium.   - Out of bed to chair with PT/OT consult as indicated.      #Acute hypoxemic respiratory failure  #COPD  Extubated on 6/13, currently on 2L NC   Maintain SaO2 > 92%     #ESRD  HD Tues/Thurs/Sat  Will need eventual permcath exchange this week     #HTN  Started on norvasc  Off nicardipine gtt, PRN IV labetalol for SBP > 180     #Renal cell carcinoma  #Urothelial carcinoma  #Thrombocytopenia  - VTE ppx: SCDs, subcutaneous heparin     #  Diabetes Mellitus type 2  -Sliding scale insulin          Nutrition: NPO  Malnutrition Documentation    Moderate Malnutrition related to inadequate nutritional intake in the setting of acute illness as evidenced by <50% EER > 5 days, mild muscle losses (temporalis, deltoid) - new                 Patient has BMI=Body mass index is 37.58 kg/m.  Diagnosis: Obesity based on BMI criteria                     Recent Labs   Lab 07/10/22  0136 07/09/22  1824 07/09/22  0638 07/09/22  0208   Hemoglobin 8.4* 9.3* 6.7* 6.9*   Hematocrit 25.2* 27.3* 20.4* 20.8*    MCV 90.3 88.9  --  93.3   WBC 23.01* 27.83*  --  30.24*   Platelet Count 239 248  --  270     Recent Labs   Lab 06/02/22  0514   Iron 62   TIBC 145*   Iron Saturation 43       Code Status: Full Code    Dispo: pending improved mental status    Family Contact: none    DVT Prophylaxis:   Current Facility-Administered Medications (Includes Only Anticoagulants, Misc. Hematological)   Medication Dose Route Last Admin   None          CHART  REVIEW & DISCUSSION     The following chart items were reviewed as of 4:35 PM on 07/10/22:  []  Lab Results []  Imaging Results   []  Problem List  []  Current Orders []  Current Medications  []  Allergies  []  Code Status []  Previous Notes   []  SDoH    The management and plan of care for this patient was discussed with the following specialty consultants:  []  Cardiology  []  Gastroenterology                 []  Infectious Disease  []  Pulmonology []  Neurology                [x]  Nephrology  []  Neurosurgery []  Orthopedic Surgery  []  Heme/Onc  []  General Surgery []  Psychiatry                                   []  Palliative    SUBJECTIVE     Shelby Barron moaning, not responding appropriately   Family updated bedside    MEDICATIONS     Current Facility-Administered Medications   Medication Dose Route Frequency    acetaminophen  650 mg Oral 4 times per day    albuterol-ipratropium  3 mL Nebulization Q6H SCH    insulin glargine  7 Units Subcutaneous QAM    insulin lispro  2-10 Units Subcutaneous Q4H SCH    meropenem  500 mg Intravenous Q24H    metoprolol tartrate  12.5 mg per NG tube Q12H [facility]    micafungin  100 mg Intravenous Q24H    pantoprazole  40 mg Intravenous BID    thiamine  100 mg Intravenous Q24H SCH       PHYSICAL EXAM     Vitals:    07/10/22 1600   BP: 138/62   Pulse: 93   Resp: (!) 28   Temp: 98.1 F (36.7 C)   SpO2: 97%       Temperature: Temp  Min:  97.8 F (36.6 C)  Max: 98.9 F (37.2 C)  Pulse: Pulse  Min: 74  Max: 132  Respiratory: Resp  Min: 19  Max: 48  Non-Invasive BP: BP   Min: 110/51  Max: 162/68  Pulse Oximetry SpO2  Min: 78 %  Max: 100 %    Intake and Output Summary (Last 24 hours) at Date Time    Intake/Output Summary (Last 24 hours) at 07/10/2022 1635  Last data filed at 07/10/2022 1600  Gross per 24 hour   Intake 922.96 ml   Output 3160 ml   Net -2237.04 ml         GEN APPEARANCE: Normal;  A&OX0  HEENT: PERLA; EOMI; Conjunctiva Clear  NECK: Supple; No bruits  CVS: RRR, S1, S2; No M/G/R  LUNGS: CTAB; No Wheezes; No Rhonchi: No rales  ABD: Soft; No TTP; + Normoactive BS  EXT: No edema; Pulses 2+ and intact  NEURO: moving UEs spontaneously, moaning intermittently unable to cooperate fully in MS exam  MENTAL STATUS:     LABS     Recent Labs   Lab 07/10/22  0136 07/09/22  1824 07/09/22  0638 07/09/22  0208   WBC 23.01* 27.83*  --  30.24*   RBC 2.79* 3.07*  --  2.23*   Hemoglobin 8.4* 9.3* 6.7* 6.9*   Hematocrit 25.2* 27.3* 20.4* 20.8*   MCV 90.3 88.9  --  93.3   Platelet Count 239 248  --  270       Recent Labs   Lab 07/10/22  0136 07/09/22  0207 07/08/22  0610 07/07/22  2120 07/07/22  0358   Sodium 141 141 140 134* 138   Potassium 3.6 4.2 4.1 4.0 3.8   Chloride 103 106 107 103 104   CO2 23 20 19 18 21    BUN 41* 97* 99* 94* 68*   Creatinine 3.6* 6.0* 5.6* 5.3* 4.2*   Glucose 124* 181* 179* 277* 230*   Calcium 8.0 8.5 8.4 8.2 8.1   Magnesium 1.9 2.2 2.2 2.3 2.1       Recent Labs   Lab 07/07/22  2120 07/06/22  0341   ALT 15  --    AST (SGOT) 23  --    Bilirubin, Total 0.1*  --    Albumin 1.4* 1.3*   Alkaline Phosphatase 69  --                      Microbiology Results (last 15 days)       Procedure Component Value Units Date/Time    Culture, Blood, Aerobic And Anaerobic [[id number]] Collected: 07/07/22 0355    Order Status: Completed Specimen: Blood, Venous Updated: 07/10/22 0701     Culture Blood No growth at 3 days    Culture, Blood, Aerobic And Anaerobic [[id number]] Collected: 07/06/22 1837    Order Status: Completed Specimen: Blood, Venous Updated: 07/10/22 0001     Culture Blood No  growth at 3 days    Culture, Sputum and Lower Respiratory [[id number]]     Order Status: Sent Specimen: Sputum, Induced     Culture, Sputum and Lower Respiratory [[id number]]  (Abnormal) Collected: 07/04/22 1034    Order Status: Completed Specimen: Sputum, Suctioned Updated: 07/09/22 2228     Culture Respiratory Heavy growth of Mixed upper respiratory flora      Light growth of Gram negative rod     Comment: Questionable significance due to low quantity.  No further work.  Gram Stain Few WBCs      Rare Squamous epithelial cells      Few Mixed respiratory flora    Rectal Swab Carbapenemase Surveillance, PCR [[id number]]  (Normal) Collected: 06/29/22 1842    Order Status: Completed Specimen: Swab from Rectum Updated: 06/30/22 0150     IMP gene sequence Not Detected     VIM gene sequence Not Detected     NDM gene sequence Not Detected     KPC gene sequence Not Detected     OXA-48 gene sequence Not Detected    Narrative:      This test utilizes real-time PCR for the detection and differentiation of gene sequences associated with carbapenem-non-susceptibility in Enterobacterales (Enterobacteriaceae), Pseudomonas aeruginosa and Acinetobacter baumannii. Targets include blaKPC, blaNDM, blaVIM, blaOXA-48 (and blaOXA-181), and blaIMP (excluding IMP-7, IMP-13, IMP-14). Detection of these gene sequences does not indicate the presence of viable organisms. This assay does not report variants of these gene sequences. Mutations or polymorphisms may affect detection of current, new, or unknown variants, resulting in a false negative result. This test is for infection prevention surveillance purposes only and should not be used to make treatment decisions. The performance of the Xpert Carba-R Assay has not been evaluated with rectal swab specimens from pediatric patients.             RADIOLOGY     XR Chest AP Portable    Result Date: 07/07/2022   There is mild interstitial prominence suggesting mild congestive heart failure/fluid  overload in the setting of shortness of breath [name], MD 07/07/2022 12:32 PM    XR Chest AP Portable    Result Date: 07/06/2022   Hypoventilation with basilar atelectasis. Charlott Rakes, MD 07/06/2022 3:35 PM    CT Abdomen Pelvis W IV And PO Cont    Result Date: 07/06/2022  1.Postoperative changes from bilateral nephrectomy and cystectomy. 2.Small amount of ascites including some areas which appear loculated. Small collections of fluid and gas in the anterior midline abdomen may be postoperative but superimposed infection is not excluded. No rim-enhancing fluid collections or extravasation of oral contrast to suggest anastomotic leak. Demetrios Isaacs, MD 07/06/2022 5:51 AM    CT Head WO Contrast    Result Date: 07/06/2022   1.No acute intracranial abnormality is seen. Leandro Reasoner, MD 07/06/2022 3:56 AM    XR Abdomen AP    Result Date: 07/05/2022  Enteric tube terminates in the distal stomach. Elease Etienne, DO 07/05/2022 12:24 PM    XR Chest AP Portable    Result Date: 06/30/2022  1. Lines and tubes in adequate position. No pneumothorax. 2. Trace left pleural effusion and bibasilar atelectasis. Jasmine December D'Heureux, MD 06/30/2022 4:53 PM    XR Chest AP Portable    Result Date: 06/30/2022  Catheters as described. Pankaj Dominica, MD 06/30/2022 2:07 PM    XR Chest AP Portable    Result Date: 06/28/2022   Right mainstem bronchial intubation. The tube should be withdrawn about 4 cm. Dr. Tildon Husky is aware. Wynema Birch, MD 06/28/2022 4:56 PM    XR OR MISCOUNT    Result Date: 06/28/2022   No unexpected foreign body. These critical results were discussed with and acknowledged by Angie R., the circulating nurse, who will communicate the findings to Everardo All, MD on 06/28/2022 3:02 PM. Bosie Helper, MD 06/28/2022 3:03 PM   Echo Results       None          No results found for this or any  previous visit.    Signed,  Ester Rink, DO  4:35 PM 07/10/2022

## 2022-07-11 DIAGNOSIS — K921 Melena: Secondary | ICD-10-CM

## 2022-07-11 LAB — BASIC METABOLIC PANEL
Anion Gap: 15 (ref 5.0–15.0)
BUN: 58 mg/dL — ABNORMAL HIGH (ref 7–21)
CO2: 21 mEq/L (ref 17–29)
Calcium: 8.4 mg/dL (ref 7.9–10.2)
Chloride: 103 mEq/L (ref 99–111)
Creatinine: 4.9 mg/dL — ABNORMAL HIGH (ref 0.4–1.0)
GFR: 8.2 mL/min/{1.73_m2} — ABNORMAL LOW (ref >=60.0)
Glucose: 156 mg/dL — ABNORMAL HIGH (ref 70–100)
Potassium: 3.6 mEq/L (ref 3.5–5.3)
Sodium: 139 mEq/L (ref 135–145)

## 2022-07-11 LAB — CBC
Absolute nRBC: 0 10*3/uL (ref <=0.00)
Hematocrit: 27.3 % — ABNORMAL LOW (ref 34.7–43.7)
Hemoglobin: 9.4 g/dL — ABNORMAL LOW (ref 11.4–14.8)
MCH: 31 pg (ref 25.1–33.5)
MCHC: 34.4 g/dL (ref 31.5–35.8)
MCV: 90.1 fL (ref 78.0–96.0)
MPV: 11.1 fL (ref 8.9–12.5)
Platelet Count: 267 10*3/uL (ref 142–346)
RBC: 3.03 10*6/uL — ABNORMAL LOW (ref 3.90–5.10)
RDW: 16 % — ABNORMAL HIGH (ref 11–15)
WBC: 20.96 10*3/uL — ABNORMAL HIGH (ref 3.10–9.50)
nRBC %: 0 /100 WBC (ref <=0.0)

## 2022-07-11 LAB — ECG 12-LEAD
Q-T Interval: 304 ms
QRS Duration: 72 ms
QTC Calculation (Bezet): 469 ms
R Axis: 7 degrees
T Axis: -71 degrees
Ventricular Rate: 143 {beats}/min

## 2022-07-11 LAB — WHOLE BLOOD GLUCOSE POCT
Whole Blood Glucose POCT: 159 mg/dL — ABNORMAL HIGH (ref 70–100)
Whole Blood Glucose POCT: 175 mg/dL — ABNORMAL HIGH (ref 70–100)
Whole Blood Glucose POCT: 179 mg/dL — ABNORMAL HIGH (ref 70–100)
Whole Blood Glucose POCT: 179 mg/dL — ABNORMAL HIGH (ref 70–100)
Whole Blood Glucose POCT: 167 mg/dL — ABNORMAL HIGH (ref 70–100)

## 2022-07-11 LAB — PHOSPHORUS: Phosphorus: 6.5 mg/dL — ABNORMAL HIGH (ref 2.3–4.7)

## 2022-07-11 LAB — MAGNESIUM: Magnesium: 2 mg/dL (ref 1.6–2.6)

## 2022-07-11 MED ORDER — SEVELAMER CARBONATE 0.8 G PO PACK
0.8000 g | PACK | Freq: Three times a day (TID) | ORAL | Status: DC
Start: 2022-07-11 — End: 2022-07-16
  Administered 2022-07-11 – 2022-07-16 (×14): 0.8 g via ORAL
  Filled 2022-07-11 (×20): qty 1

## 2022-07-11 MED ORDER — GLYCOPYRROLATE 0.2 MG/ML IJ SOLN (WRAP)
0.2000 mg | Freq: Three times a day (TID) | INTRAMUSCULAR | Status: DC
Start: 2022-07-11 — End: 2022-08-11
  Administered 2022-07-11 – 2022-08-11 (×85): 0.2 mg via INTRAVENOUS
  Filled 2022-07-11 (×88): qty 1

## 2022-07-11 MED ORDER — OXYCODONE HCL 5 MG PO TABS
5.0000 mg | ORAL_TABLET | ORAL | Status: DC | PRN
Start: 2022-07-11 — End: 2022-07-19
  Administered 2022-07-11 – 2022-07-16 (×17): 5 mg via ORAL
  Filled 2022-07-11 (×17): qty 1

## 2022-07-11 NOTE — Progress Notes (Signed)
Gastroenterology  PROGRESS NOTE  Gastroenterology Consult Service - [facility]  Pager ID: [phone]  Epic Chat (Group): [facility]  [address] [place], [place] [address]  Appointments: [phone]    Date Time:    07/11/22    3:57 PM  Patient Name: Shelby Barron    LOS: 13 days     Assessment and Recommendations:   Shelby Barron is a 82 y.o. female with PMH of COPD, CVA, DM2, renal cell carcinoma s/p partial b/l nephrectomy, [name], new urothelial cancer who is admitted for for elective cystectomy and complete nephrectomy. OR course c/b small bowel enterotomy and hemorrhagic shock. Return to OR for Abdominal wall closure 6/12. Consulted for coffee ground emesis.    # UGI bleed  - coffee grounds in oral cavity - no further episodes.   - melena in rectal tube, improved    # Acute blood loss anemia  - Hgb 6.7 s/p 1 unit 07/09/22 and hemoglobin has remained stable.      # S/p cystectomy and completion nephrectomy c/b small bowel enterotomy 6/10  # S/p small bowel anastomosis, closure of mesenteric deveft, closure abdominal wall with wound VAC 6/12  # Encephalopathy/delirium  # Acute hypoxemic respiratory failure (3-4 L NC)  # [name] T/TH/Sat  # Renal cell carcinoma  # Urothelial cell carcinoma  # H/o CVA on plavix PTA    RECOMMENDATIONS:  In setting of patient being high risk for anesthesia/procedures, continue medical management for concern for UGI bleed.  Continue to monitor H/H.  Hgb has been stable >24 hours.   Continue IV PPI 40 mg BID.   Diet per surgery.   GI will go to standby at this time. If further assistance is needed, please re-consult the GI team.     Case has been reviewed and discussed with the GI attending, Dr. Kenney Houseman, MD, and plan of care formulated together.    Please call if clinical status changes and urgent need for GI.  Subjective   Interval History/Subjective:  - No significant overt GI bleeding.  No blood in oral cavity.  NGT with cloudy tan output.   -Hbg stable.     Review  of Systems:     Pertinent positives and negatives noted in Interval History/Subjective.    Physical Exam:   Patient Vitals for the past 24 hrs:   BP Temp Temp src Pulse Resp SpO2 Weight   07/11/22 1500 135/63 -- -- 93 22 96 % --   07/11/22 1400 153/67 -- -- 97 22 95 % --   07/11/22 1200 124/59 -- -- 85 (!) 32 95 % --   07/11/22 1111 133/62 99 F (37.2 C) Axillary 80 (!) 39 95 % --   07/11/22 1020 97/50 -- -- 79 (!) 46 97 % --   07/11/22 1000 98/50 -- -- 78 (!) 41 94 % --   07/11/22 0902 -- -- -- 90 (!) 26 95 % --   07/11/22 0900 124/53 -- -- 89 (!) 26 94 % --   07/11/22 0827 122/60 -- -- 84 -- -- --   07/11/22 0803 131/63 98.7 F (37.1 C) Axillary 92 (!) 33 95 % --   07/11/22 0800 -- -- -- 92 (!) 27 95 % --   07/11/22 0508 159/70 -- -- -- -- -- --   07/11/22 0416 147/63 98.8 F (37.1 C) Axillary 100 20 95 % 88.5 kg (195 lb 1.7 oz)   07/10/22 2324 153/67 98.7 F (  37.1 C) Axillary 81 20 92 % --   07/10/22 2122 130/63 -- -- 81 -- -- --   07/10/22 2047 -- -- -- 80 18 99 % --   07/10/22 1959 137/63 98.6 F (37 C) Axillary 78 20 97 % 88.3 kg (194 lb 10.7 oz)   07/10/22 1900 126/56 -- -- 86 (!) 25 96 % --   07/10/22 1808 141/64 -- -- -- -- -- --   07/10/22 1800 141/64 -- -- 84 (!) 27 98 % --   07/10/22 1700 116/52 -- -- 87 (!) 27 98 % --   07/10/22 1600 138/62 98.1 F (36.7 C) Axillary 93 (!) 28 97 % --     Patient observed - Ill appearing, opens eyes to stimuli.  Respiratory - non labored respirations, no audible wheezing. On supplemental NC.  Abdomen - wound vac in place.  NGT in place without bloody output.   Skin - normal color and turgor.     Meds:     Current Facility-Administered Medications   Medication Dose Route Frequency    acetaminophen  650 mg Oral 4 times per day    albuterol-ipratropium  3 mL Nebulization Q6H WA    glycopyrrolate  0.2 mg Intravenous Q8H    insulin glargine  7 Units Subcutaneous QAM    insulin lispro  2-10 Units Subcutaneous Q4H SCH    meropenem  500 mg Intravenous Q24H    metoprolol  tartrate  12.5 mg per NG tube Q12H [facility]    micafungin  100 mg Intravenous Q24H    pantoprazole  40 mg Intravenous BID    sevelamer carbonate  0.8 g Oral TID MEALS    thiamine  100 mg Intravenous Q24H SCH     Continuous Infusions:   amiodarone 0.5 mg/min (07/11/22 0508)     PRN Meds:.albuterol-ipratropium, benzocaine, dextrose **OR** dextrose **OR** dextrose **OR** glucagon (rDNA), diphenhydrAMINE, labetalol, lidocaine 2% jelly, naloxone, oxyCODONE, phenol    Labs Reviewed:     Recent Labs   Lab 07/11/22  0415 07/10/22  0136 07/09/22  1824 07/09/22  0638 07/09/22  0208   WBC 20.96* 23.01* 27.83*  --  30.24*   Hemoglobin 9.4* 8.4* 9.3* 6.7* 6.9*   Hematocrit 27.3* 25.2* 27.3* 20.4* 20.8*   Platelet Count 267 239 248  --  270     Recent Labs   Lab 07/11/22  0415 07/10/22  0136 07/09/22  0207 07/08/22  0610 07/07/22  2120 07/06/22  1545 07/06/22  0341   Sodium 139 141 141  More results in Results Review 134*  More results in Results Review 139   Potassium 3.6 3.6 4.2  More results in Results Review 4.0  More results in Results Review 4.5   Chloride 103 103 106  More results in Results Review 103  More results in Results Review 106   CO2 21 23 20   More results in Results Review 18  More results in Results Review 21   BUN 58* 41* 97*  More results in Results Review 94*  More results in Results Review 79*   Creatinine 4.9* 3.6* 6.0*  More results in Results Review 5.3*  More results in Results Review 5.3*   Calcium 8.4 8.0 8.5  More results in Results Review 8.2  More results in Results Review 7.7*   Albumin  --   --   --   --  1.4*  --  1.3*   Protein, Total  --   --   --   --  4.1*  --   --    Bilirubin, Total  --   --   --   --  0.1*  --   --    Alkaline Phosphatase  --   --   --   --  69  --   --    ALT  --   --   --   --  15  --   --    AST (SGOT)  --   --   --   --  23  --   --    Glucose 156* 124* 181*  More results in Results Review 277*  More results in Results Review 210*   More results in Results Review = values  in this interval not displayed.               Rads:   GI Radiological Procedures since admission reviewed.   Radiology Results (24 Hour)       Procedure Component Value Units Date/Time    XR Chest AP Portable [[id number]] Collected: 07/10/22 1648    Order Status: Completed Updated: 07/10/22 1652    Narrative:      HISTORY: Cough.     COMPARISON: 07/07/2022.    FINDINGS:   The heart is top normal size with central vascular prominence. Interstitial  markings have slightly increased, suggesting minimal edema. There is  minimal bibasilar atelectasis. Enteric tube and large bore right venous  catheters remain. There is no pneumothorax.      Impression:        Interstitial markings have slightly increased, suggesting minimal edema. No  other change.    Wilmon Pali, MD  07/10/2022 4:49 PM            Signed by: Netta Neat, FNP-BC

## 2022-07-11 NOTE — Progress Notes (Signed)
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   Hypophosphatemia  - now hyperphosphatemic   Encephalopathy - persists  Leukocytosis - post op, improving - neg blood cx    Recommendations:     HD tomorrow  HD Tuesday to get back on TTS schedule   Start phos binder with feeds, recheck phos level tomorrow  Will need new permcath with IR next week - WBC is elevated but coming down   Dose medicine for ESRD    Case discussed with: pt, daughter, attending via SC    Henrietta Dine, MD  [facility]  [phone] (office)  X [phone] (FF[facility] Spectra-Link)      Subjective:  UF session yesterday, removed 2 L without issues  Pt now on tube feeds, remains confused  WBC downtrending     Review of Systems:   Unable to assess, patient is confused     Physical Exam:     Vitals:    07/11/22 0416 07/11/22 0508 07/11/22 0803 07/11/22 0827   BP: 147/63 159/70 131/63 122/60   Pulse: 100  92 84   Resp: 20  (!) 33    Temp: 98.8 F (37.1 C)  98.7 F (37.1 C)    TempSrc: Axillary  Axillary    SpO2: 95%  95%    Weight: 88.5 kg (195 lb 1.7 oz)      Height:            Intake and Output Summary (Last 24 hours) at Date Time    Intake/Output Summary (Last 24 hours) at 07/11/2022 0981  Last data filed at 07/11/2022 0600  Gross per 24 hour   Intake 1013.83 ml   Output 2050 ml   Net -1036.17 ml       General: confused, on NC  Cardiovascular: regular rate and rhythm  Lungs: bilateral air entry  Abdomen: soft  Extremities: edema noted in her hands and thighs  Access: R quinton, old RIJ permacath    Meds:      Scheduled Meds: PRN Meds:    acetaminophen, 650 mg, Oral, 4 times per day  albuterol-ipratropium, 3 mL, Nebulization, Q6H WA  insulin glargine, 7 Units, Subcutaneous, QAM  insulin lispro, 2-10 Units, Subcutaneous, Q4H SCH  meropenem, 500 mg, Intravenous, Q24H  metoprolol tartrate, 12.5 mg, per NG tube, Q12H SCH  micafungin, 100 mg, Intravenous, Q24H  pantoprazole, 40 mg, Intravenous, BID  thiamine, 100 mg, Intravenous, Q24H SCH          Continuous Infusions:   amiodarone 0.5 mg/min (07/11/22 0508)  albuterol-ipratropium, 3 mL, Q6H PRN  benzocaine, 1 spray, TID PRN  dextrose, 15 g of glucose, PRN   Or  dextrose, 12.5 g, PRN   Or  dextrose, 12.5 g, PRN   Or  glucagon (rDNA), 1 mg, PRN  diphenhydrAMINE, 25 mg, Q6H PRN  labetalol, 10 mg, Q2H PRN  lidocaine 2% jelly, , Once PRN  naloxone, 0.2 mg, PRN  oxyCODONE, 5 mg, Q4H PRN  phenol, 1 spray, Q2H PRN              Labs:     Recent Labs   Lab 07/11/22  0415 07/10/22  0136 07/09/22  1824   WBC 20.96* 23.01* 27.83*   Hemoglobin 9.4* 8.4* 9.3*   Hematocrit 27.3* 25.2* 27.3*   Platelet Count 267 239 248     Recent Labs   Lab 07/11/22  0415 07/10/22  0136 07/09/22  0207 07/08/22  0610 07/07/22  2120 07/06/22  1545 07/06/22  0341   Sodium 139 141 141  More results in Results Review 134*  More results in Results Review 139   Potassium 3.6 3.6 4.2  More results in Results Review 4.0  More results in Results Review 4.5   Chloride 103 103 106  More results in Results Review 103  More results in Results Review 106   CO2 21 23 20   More  results in Results Review 18  More results in Results Review 21   BUN 58* 41* 97*  More results in Results Review 94*  More results in Results Review 79*   Creatinine 4.9* 3.6* 6.0*  More results in Results Review 5.3*  More results in Results Review 5.3*   Calcium 8.4 8.0 8.5  More results in Results Review 8.2  More results in Results Review 7.7*   Albumin  --   --   --   --  1.4*  --  1.3*   Phosphorus 6.5* 4.9* 6.6*  More results in Results Review 6.5*  --  4.2   Magnesium 2.0 1.9 2.2  More results in Results Review 2.3  More results in Results Review 2.3   Glucose 156* 124* 181*  More results in Results Review 277*  More results in Results Review 210*   GFR 8.2* 11.9* 6.4*  More results in Results Review 7.5*  More results in Results Review 7.5*   More results in Results Review = values in this interval not displayed.           Signed by: Henrietta Dine, MD, MD

## 2022-07-11 NOTE — Plan of Care (Signed)
Neuro:  pt non verbal,  follows commands, can moves all extremities, overcomes gravity. Gag and cough preset. Intermittently moans, PRN oxycodone has been given for pain and comfort.   CV: A fib to sinus arrhthymias,  70's to 100's, SBP: 120's to 160's. Palpable pulses, BLE edema +1/+2 pitting. Cont infusion amiodarone gtt @ 0.5  Resp: On 3 L NC, small oral secretion, however, evidence of retained secretion @ back of throat, neb treatment given by RT,  coarse  B/L lungs sounds.   GI: No BM this shift, q4h Blood glucose  check. Cont TF via NG tube @ rate of 45, and 100 ml q4h water flushes.   GU: Anuric  Skin: surgical incision @ abdomen, w/ wound vac, no output @ wound vac.     Safety and fall precaution remain in place. Purposeful rounding completed.     Problem: Moderate/High Fall Risk Score >5  Goal: Patient will remain free of falls  Outcome: Progressing  Flowsheets (Taken 07/11/2022 1000)  High (Greater than 13):   HIGH-Visual cue at entrance to patient's room   HIGH-Bed alarm on at all times while patient in bed   HIGH-Utilize chair pad alarm for patient while in the chair   HIGH-Apply yellow "Fall Risk" arm band   HIGH-Initiate use of floor mats as appropriate   HIGH-Consider use of low bed   HIGH-Pharmacy to initiate evaluation and intervention per protocol     Problem: Compromised Sensory Perception  Goal: Sensory Perception Interventions  Outcome: Progressing  Flowsheets (Taken 07/11/2022 0800)  Sensory Perception Interventions: Offload heels, Pad bony prominences, Reposition q 2hrs/turn Clock, Q2 hour skin assessment under devices if present     Problem: Compromised Moisture  Goal: Moisture level Interventions  Outcome: Progressing  Flowsheets (Taken 07/11/2022 0800)  Moisture level Interventions: Moisture wicking products, Moisture barrier cream     Problem: Compromised Activity/Mobility  Goal: Activity/Mobility Interventions  Outcome: Progressing  Flowsheets (Taken 07/11/2022 0800)  Activity/Mobility  Interventions: Pad bony prominences, TAP Seated positioning system when OOB, Promote PMP, Reposition q 2 hrs / turn clock, Offload heels     Problem: Compromised Nutrition  Goal: Nutrition Interventions  Outcome: Progressing  Flowsheets (Taken 07/11/2022 0800)  Nutrition Interventions: Discuss nutrition at Rounds, I&Os, Document % meal eaten, Daily weights     Problem: Compromised Friction/Shear  Goal: Friction and Shear Interventions  Outcome: Progressing  Flowsheets (Taken 07/11/2022 0800)  Friction and Shear Interventions: Pad bony prominences, Off load heels, HOB 30 degrees or less unless contraindicated, Consider: TAP seated positioning, Heel foams     Problem: SCIP  Goal: SCIP measures are followed  Outcome: Progressing  Flowsheets (Taken 07/11/2022 1104)  SCIP measures are followed:   Administer antibiotics as ordered   VTE Prevention: Administer anticoagulant(s) and/or apply anti-embolism stockings/devices as ordered   Provide VTE prophylaxis within 24 hours of anesthesia end time (Note: SCD to lower extremities)   Remove Foley catheter per order/protocol     Problem: Inadequate Airway Clearance  Goal: Patent Airway maintained  Outcome: Progressing  Flowsheets (Taken 07/11/2022 1104)  Patent airway maintained:   Position patient for maximum ventilatory efficiency   Provide adequate fluid intake to liquefy secretions   Reinforce use of ordered respiratory interventions (i.e. CPAP, BiPAP, Incentive Spirometer, Acapella, etc.)   Reposition patient every 2 hours and as needed unless able to self-reposition   Suction secretions as needed  Goal: Normal respiratory rate/effort achieved/maintained  Outcome: Progressing  Flowsheets (Taken 07/11/2022 1104)  Normal respiratory rate/effort achieved/maintained: Plan  activities to conserve energy: plan rest periods     Problem: Infection Prevention  Goal: Free from infection  Outcome: Progressing  Flowsheets (Taken 07/11/2022 1104)  Free from infection:   Monitor/assess vital  signs   Encourage/assist patient to turn, cough and perform deep breathing every 2 hours   Assess incision for evidence of healing   Monitor/assess lab values and report abnormal values   Monitor/assess output from surgical drain if present   Assess for signs and symptoms of infection     Problem: Impaired Mobility  Goal: Mobility/Activity is maintained at optimal level for patient  Outcome: Progressing  Flowsheets (Taken 07/11/2022 1104)  Mobility/activity is maintained at optimal level for patient:   Encourage independent activity per ability   Consult/collaborate with Physical Therapy and/or Occupational Therapy     Problem: Constipation  Goal: Fluid and electrolyte balance are achieved/maintained  Outcome: Progressing  Flowsheets (Taken 07/11/2022 1104)  Fluid and electrolyte balance are achieved/maintained:   Monitor/assess lab values and report abnormal values   Assess and reassess fluid and electrolyte status   Observe for cardiac arrhythmias   Monitor for muscle weakness  Goal: Elimination patterns are normal or improving  Outcome: Progressing     Problem: Safety  Goal: Patient will be free from injury during hospitalization  Outcome: Progressing  Flowsheets (Taken 07/11/2022 1104)  Patient will be free from injury during hospitalization:   Assess patient's risk for falls and implement fall prevention plan of care per policy   Use appropriate transfer methods   Include patient/ family/ care giver in decisions related to safety   Assess for patients risk for elopement and implement Elopement Risk Plan per policy   Hourly rounding   Ensure appropriate safety devices are available at the bedside   Provide and maintain safe environment   Provide alternative method of communication if needed (communication boards, writing)  Goal: Patient will be free from infection during hospitalization  Outcome: Progressing  Flowsheets (Taken 07/11/2022 1104)  Free from Infection during hospitalization:   Assess and monitor for  signs and symptoms of infection   Monitor all insertion sites (i.e. indwelling lines, tubes, urinary catheters, and drains)   Encourage patient and family to use good hand hygiene technique   Monitor lab/diagnostic results     Problem: Pain  Goal: Pain at adequate level as identified by patient  Outcome: Progressing  Flowsheets (Taken 07/11/2022 1104)  Pain at adequate level as identified by patient:   Assess for risk of opioid induced respiratory depression, including snoring/sleep apnea. Alert healthcare team of risk factors identified.   Assess pain on admission, during daily assessment and/or before any "as needed" intervention(s)   Reassess pain within 30-60 minutes of any procedure/intervention, per Pain Assessment, Intervention, Reassessment (AIR) Cycle   Evaluate if patient comfort function goal is met   Offer non-pharmacological pain management interventions   Consult/collaborate with Physical Therapy, Occupational Therapy, and/or Speech Therapy   Include patient/patient care companion in decisions related to pain management as needed   Consult/collaborate with Pain Service   Evaluate patient's satisfaction with pain management progress   Identify patient comfort function goal     Problem: Side Effects from Pain Analgesia  Goal: Patient will experience minimal side effects of analgesic therapy  Outcome: Progressing  Flowsheets (Taken 07/11/2022 1104)  Patient will experience minimal side effects of analgesic therapy:   Monitor/assess patient's respiratory status (RR depth, effort, breath sounds)   Prevent/manage side effects per LIP orders (i.e. nausea, vomiting, pruritus,  constipation, urinary retention, etc.)   Evaluate for opioid-induced sedation with appropriate assessment tool (i.e. POSS)   Assess for changes in cognitive function     Problem: Hemodynamic Status: Cardiac  Goal: Stable vital signs and fluid balance  Outcome: Progressing  Flowsheets (Taken 07/11/2022 1104)  Stable vital signs and fluid  balance:   Assess signs and symptoms associated with cardiac rhythm changes   Monitor lab values     Problem: Renal Instability  Goal: Fluid and electrolyte balance are achieved/maintained  Outcome: Progressing  Flowsheets (Taken 07/11/2022 1104)  Fluid and electrolyte balance are achieved/maintained:   Monitor/assess lab values and report abnormal values   Monitor for muscle weakness   Follow fluid restrictions/IV/PO parameters   Observe for cardiac arrhythmias   Assess and reassess fluid and electrolyte status     Problem: Patient Receiving Advanced Renal Therapies  Goal: Therapy access site remains intact  Outcome: Progressing  Flowsheets (Taken 07/11/2022 1104)  Therapy access site remains intact:   Assess therapy access site   Change therapy access site dressing as needed     Problem: Fluid and Electrolyte Imbalance/ Endocrine  Goal: Fluid and electrolyte balance are achieved/maintained  Outcome: Progressing  Flowsheets (Taken 07/11/2022 1104)  Fluid and electrolyte balance are achieved/maintained:   Monitor/assess lab values and report abnormal values   Assess and reassess fluid and electrolyte status   Observe for cardiac arrhythmias   Monitor for muscle weakness  Goal: Adequate hydration  Outcome: Progressing  Flowsheets (Taken 07/11/2022 1104)  Adequate hydration:   Assess mucus membranes, skin color, turgor, perfusion and presence of edema   Assess for peripheral, sacral, periorbital and abdominal edema   Monitor and assess vital signs and perfusion     Problem: Diabetes: Glucose Imbalance  Goal: Blood glucose stable at established goal  Outcome: Progressing  Flowsheets (Taken 07/11/2022 1104)  Blood glucose stable at established goal:   Monitor lab values   Monitor intake and output.  Notify LIP if urine output is < 30 mL/hour.   Include patient/family in decisions related to nutrition/dietary selections   Monitor/assess vital signs   Ensure adequate hydration   Ensure patient/family has adequate teaching  materials   Ensure appropriate consults are obtained (Nutrition, Diabetes Education, and Case Management)   Assess for hypoglycemia /hyperglycemia   Ensure appropriate diet and assess tolerance   Follow fluid restrictions/IV/PO parameters   Coordinate medication administration with meals, as indicated     Problem: Inadequate Gas Exchange  Goal: Patent Airway maintained  Outcome: Progressing  Flowsheets (Taken 07/11/2022 1104)  Patent airway maintained:   Position patient for maximum ventilatory efficiency   Provide adequate fluid intake to liquefy secretions   Reinforce use of ordered respiratory interventions (i.e. CPAP, BiPAP, Incentive Spirometer, Acapella, etc.)   Reposition patient every 2 hours and as needed unless able to self-reposition   Suction secretions as needed  Goal: Adequate oxygenation and improved ventilation  Outcome: Progressing  Flowsheets (Taken 07/11/2022 1104)  Adequate oxygenation and improved ventilation:   Assess lung sounds   Monitor SpO2 and treat as needed   Plan activities to conserve energy: plan rest periods   Consult/collaborate with Respiratory Therapy   Teach/reinforce use of incentive spirometer 10 times per hour while awake, cough and deep breath as needed   Provide mechanical and oxygen support to facilitate gas exchange   Monitor and treat ETCO2   Position for maximum ventilatory efficiency   Increase activity as tolerated/progressive mobility

## 2022-07-11 NOTE — Progress Notes (Cosign Needed)
4 eyes in 4 hours pressure injury assessment note:      Completed with:   Unit & Time admitted:              Bony Prominences: Check appropriate box; if wound is present enter wound assessment in LDA     Occiput:                 [x] WNL  []  Wound present  Face:                     [x] WNL  []  Wound present  Ears:                      [x] WNL  []  Wound present  Spine:                    [x] WNL  []  Wound present  Shoulders:             [x] WNL  []  Wound present  Elbows:                  [x] WNL  []  Wound present  Sacrum/coccyx:     [x] WNL  []  Wound present  Ischial Tuberosity:  [x] WNL  []  Wound present  Trochanter/Hip:      [x] WNL  []  Wound present  Knees:                   [x] WNL  []  Wound present  Ankles:                   [x] WNL  []  Wound present  Heels:                    [x] WNL  []  Wound present  Other pressure areas:  []  Wound location       Device related: [x]  Device name: surgical incision @ abdomen, w/ wound vac        LDA completed if wound present: yes/no  Consult WOCN if necessary    Other skin related issues, ie tears, rash, etc, document in Integumentary flowsheet

## 2022-07-11 NOTE — Progress Notes (Signed)
[facility] PROGRESS NOTE      Date of Note: 07/11/2022   Patient Name: FRANSHESCA CHIPMAN     Date of Birth:  March 16, 1940     MRN:               [id number]     PCP:               Patsy Lager, MD     PLAN     Leukocytosis improving . 6/18 scan without any abscess or leak  Wound vac   HD per nephrology     I will discuss/discussed the plan of care with the following services:  Nursing     I spent a total of 15 minutes involved in this patient's care    Orene Desanctis, MD  07/11/2022, 1:47 PM    SUBJECTIVE     Interval History:       Patient Active Problem List    Diagnosis Date Noted    Anemia 06/30/2022    COPD (chronic obstructive pulmonary disease) 06/30/2022    Bladder cancer 06/28/2022    Malignant neoplasm of urinary bladder, unspecified site 06/25/2022    Malignant tumor of kidney, left 06/25/2022    GERD (gastroesophageal reflux disease) 06/25/2022    Leukocytosis 06/25/2022    Chronic diastolic congestive heart failure 06/23/2022    Aortic atherosclerosis 06/23/2022    Peritonitis 05/26/2022    ESRD (end stage renal disease) 05/26/2022    Hamstring injury 05/10/2013    Benign essential hypertension 02/10/2013    Left renal mass 11/28/2012    Type 2 diabetes mellitus with kidney complication, with long-term current use of insulin 07/31/2010    Diabetes mellitus type II, uncontrolled 07/31/2010    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   Respiratory: Normal rate. No retractions or increased work of breathing.    Abdomen: non-distended, soft, non-tender, no rebound or guarding    LABS & IMAGING     CBC  Recent Labs     07/11/22  0415 07/10/22  0136 07/09/22  1824   WBC 20.96* 23.01* 27.83*   RBC 3.03* 2.79* 3.07*   Hematocrit 27.3* 25.2* 27.3*   MCV 90.1 90.3 88.9   MCH 31.0 30.1 30.3   MCHC 34.4 33.3 34.1   RDW 16* 16* 15   MPV 11.1 11.0 10.9       BMP  Recent Labs     07/11/22  0415 07/10/22  0136 07/09/22  0207   CO2 21 23 20    Anion Gap 15.0 15.0 15.0   BUN 58* 41* 97*   Creatinine 4.9* 3.6* 6.0*       INR  Lab Results   Component Value Date/Time    INR 1.1  07/01/2022 11:20 AM    INR 1.1 06/01/2022 03:15 AM         IMAGING:  XR Chest AP Portable   Final Result      Interstitial markings have slightly increased, suggesting minimal edema. No   other change.      Wilmon Pali, MD   07/10/2022 4:49 PM      XR Chest AP Portable   Final Result    There is mild interstitial prominence suggesting mild   congestive heart failure/fluid overload in the setting of shortness of   breath      [name], MD   07/07/2022 12:32 PM      XR Chest AP Portable   Final Result       Hypoventilation with basilar atelectasis.      Charlott Rakes, MD   07/06/2022 3:35 PM      CT Abdomen Pelvis W IV And PO Cont   Final Result      1.Postoperative changes from bilateral nephrectomy and cystectomy.    2.Small amount of ascites including some areas which appear loculated.   Small collections of fluid and gas in the anterior midline abdomen may be   postoperative but superimposed infection is not excluded. No rim-enhancing   fluid collections or extravasation of oral contrast to suggest anastomotic   leak.      Demetrios Isaacs, MD   07/06/2022 5:51 AM      CT Head WO Contrast   Final Result       1.No acute intracranial abnormality is seen.      Leandro Reasoner, MD   07/06/2022 3:56 AM      XR Abdomen AP   Final Result      Enteric tube terminates in the distal stomach.      Elease Etienne, DO   07/05/2022 12:24 PM      XR Chest AP Portable   Final Result          1. Lines and tubes in adequate position. No pneumothorax.   2. Trace left pleural effusion and bibasilar atelectasis.      Jasmine December D'Heureux, MD   06/30/2022 4:53 PM      XR Chest AP Portable   Final Result      Catheters as described.      Pankaj Dominica, MD   06/30/2022 2:07 PM      XR Chest AP Portable   Final Result    Right mainstem bronchial intubation. The tube should be   withdrawn about 4 cm.      Dr. Tildon Husky is aware.      Wynema Birch, MD   06/28/2022 4:56 PM      XR OR MISCOUNT   Final Result    No unexpected foreign body.      These critical results were discussed with and acknowledged by Angie R.,   the circulating nurse, who will communicate the findings to Everardo All,   MD on 06/28/2022 3:02 PM.         Bosie Helper, MD   06/28/2022 3:03 PM

## 2022-07-11 NOTE — Plan of Care (Signed)
Problem: Moderate/High Fall Risk Score >5  Goal: Patient will remain free of falls  Outcome: Progressing     Problem: Compromised Sensory Perception  Goal: Sensory Perception Interventions  Outcome: Progressing     Problem: Compromised Moisture  Goal: Moisture level Interventions  Outcome: Progressing     Problem: Compromised Activity/Mobility  Goal: Activity/Mobility Interventions  Outcome: Progressing     Problem: Compromised Nutrition  Goal: Nutrition Interventions  Outcome: Progressing     Problem: Compromised Friction/Shear  Goal: Friction and Shear Interventions  Outcome: Progressing     Problem: SCIP  Goal: SCIP measures are followed  Outcome: Progressing     Problem: Inadequate Airway Clearance  Goal: Patent Airway maintained  Outcome: Progressing  Goal: Normal respiratory rate/effort achieved/maintained  Outcome: Progressing     Problem: Infection Prevention  Goal: Free from infection  Outcome: Progressing     Problem: Impaired Mobility  Goal: Mobility/Activity is maintained at optimal level for patient  Outcome: Progressing     Problem: Constipation  Goal: Fluid and electrolyte balance are achieved/maintained  Outcome: Progressing  Goal: Elimination patterns are normal or improving  Outcome: Progressing     Problem: Safety  Goal: Patient will be free from injury during hospitalization  Outcome: Progressing  Goal: Patient will be free from infection during hospitalization  Outcome: Progressing     Problem: Pain  Goal: Pain at adequate level as identified by patient  Outcome: Progressing     Problem: Side Effects from Pain Analgesia  Goal: Patient will experience minimal side effects of analgesic therapy  Outcome: Progressing     Problem: Hemodynamic Status: Cardiac  Goal: Stable vital signs and fluid balance  Outcome: Progressing     Problem: Renal Instability  Goal: Fluid and electrolyte balance are achieved/maintained  Outcome: Progressing     Problem: Patient Receiving Advanced Renal Therapies  Goal:  Therapy access site remains intact  Outcome: Progressing     Problem: Fluid and Electrolyte Imbalance/ Endocrine  Goal: Fluid and electrolyte balance are achieved/maintained  Outcome: Progressing  Goal: Adequate hydration  Outcome: Progressing     Problem: Diabetes: Glucose Imbalance  Goal: Blood glucose stable at established goal  Outcome: Progressing     Problem: Inadequate Gas Exchange  Goal: Patent Airway maintained  Outcome: Progressing  Goal: Adequate oxygenation and improved ventilation  Outcome: Progressing     Problem: Artificial Airway  Goal: Endotracheal tube will be maintained  Outcome: Progressing

## 2022-07-11 NOTE — Progress Notes (Addendum)
[facility]  PROGRESS NOTE      Patient: Shelby Barron  Date:07/08/2022   LOS: 13 Days  Admission Date: 06/28/2022   MRN: [id number]  Attending: Ester Rink, DO  When on service as the attending, please contact me on Epic Secure Chat from 7 AM - 7 PM for non-urgent issues. For urgent matters use XTend page from 7 AM - 7 PM.       ASSESSMENT/PLAN     ALEXANDERIA Barron is a 82 y.o. female admitted with Bladder cancer    Interval Summary: [name], [name], type 2 diabetes, renal cell carcinoma status post partial bilateral nephrectomy, [name], new diagnosis of urothelial cancer initally presented on 6/10 for elective cystectomy and completion nephrectomy, [name], [name], extubated and off pressors since 6/13.     She is hypoactive and delirious. Surgery recommended NGT and CTAP today. Plan to start TF today. CTH pending.  Has [name] T/Th/Sat. Nephrology following     Developed new onset afib RVR today during HD. Stopped norvasc switched to metoprolol and gave additional IV metoprolol. Consulted cardiology. Discussed with ICU requested a consult given deterioration. Recommended repeat blood cultures and broadening antibiotics to meropenem. Palliative care consulted.    Rising WBC. Awaiting blood cultures. Patient tachypneic CXR with pulm edema. Discussed with nephrology giving 3 mg IV bumex. Had dark emesis. Vitals stable. TFs held, started IV PPI, follow AM labs. Discussed with GI and surgery.    No further episodes of Afib RVR now on amiodarone gtt and PO metoprolol. Hgb stable. Repeating CXR, clear. Started robinul for upper airway secretions/rattling.     New permacath placement planned for early next week.    Patient Active Hospital Problem List:  #S/P Cystectomy and completion nephrectomy complicated by small bowel enterotomy   #Mixed shock - hemorrhagic vs septic:  Resolved  - 6/10 small bowel enteretomy with 1L EBL. Went to the OR on June 12 for small bowel anastomosis, closure of mesenteric defect, closure of abdominal wall with wound VAC application  - Failed SLP eval yesterday. Will plan to initiate on tube feeds per nutrition recs  -Wound VAC changed 6/15  - Surgery consulted, recommend CTAP with PO contrast to eval for intra-abdominal source of infection given uptrending WBC. Surgery ordered pre-medication with steroid/benadryl given h/o contrast allergy  - trend CBC, transfuse Hgb<7. Transfused 3 units PRBC in OR. Received another unit on 6/11 and 6/12  - TTE as part of preop cardiac clearance which showed Mild aortic stenosis (AVA 1.7), grade 1 diastolic dysfunction, normal LV/RV systolic function  - ID consulted, continue IV cefepime and flagyl     #History of CVA  #Encephalopathy/delirium  - CT head pending  - Hold home plavix  - Adhere to day/night cycles and administer constant reorientation.   - CAM ICU monitoring with early initiation of atypical antipsychotics as needed for control of agitated delirium.   - Out of bed to chair with PT/OT consult as indicated.      #Acute hypoxemic respiratory failure  #COPD  Extubated on 6/13, currently on 2L NC   Maintain SaO2 > 92%     #ESRD  HD Tues/Thurs/Sat  Will need eventual permcath exchange this week     #HTN  Started on norvasc  Off nicardipine gtt, PRN IV labetalol for SBP > 180     #  Renal cell carcinoma  #Urothelial carcinoma  #Thrombocytopenia  - VTE ppx: SCDs, subcutaneous heparin     #Diabetes Mellitus type 2  -Sliding scale insulin          Nutrition: NPO  Malnutrition Documentation    Moderate Malnutrition related to inadequate nutritional intake in the setting of acute illness as evidenced by <50% EER > 5 days, mild muscle losses (temporalis, deltoid) - new                 Patient has BMI=Body mass index is 35.69 kg/m.  Diagnosis: Obesity based on BMI criteria                       Recent Labs   Lab  07/11/22  0415 07/10/22  0136 07/09/22  1824   Hemoglobin 9.4* 8.4* 9.3*   Hematocrit 27.3* 25.2* 27.3*   MCV 90.1 90.3 88.9   WBC 20.96* 23.01* 27.83*   Platelet Count 267 239 248     Recent Labs   Lab 06/02/22  0514   Iron 62   TIBC 145*   Iron Saturation 43       Code Status: Full Code    Dispo: pending improved mental status    Family Contact: none    DVT Prophylaxis:   Current Facility-Administered Medications (Includes Only Anticoagulants, Misc. Hematological)   Medication Dose Route Last Admin   None          CHART  REVIEW & DISCUSSION     The following chart items were reviewed as of 1:00 PM on 07/11/22:  []  Lab Results []  Imaging Results   []  Problem List  []  Current Orders []  Current Medications  []  Allergies  []  Code Status []  Previous Notes   []  SDoH    The management and plan of care for this patient was discussed with the following specialty consultants:  []  Cardiology  []  Gastroenterology                 []  Infectious Disease  []  Pulmonology []  Neurology                [x]  Nephrology  []  Neurosurgery []  Orthopedic Surgery  []  Heme/Onc  []  General Surgery []  Psychiatry                                   []  Palliative    SUBJECTIVE     Shelby Barron moaning, not responding appropriately   Family updated bedside    MEDICATIONS     Current Facility-Administered Medications   Medication Dose Route Frequency    acetaminophen  650 mg Oral 4 times per day    albuterol-ipratropium  3 mL Nebulization Q6H WA    glycopyrrolate  0.2 mg Intravenous Q8H    insulin glargine  7 Units Subcutaneous QAM    insulin lispro  2-10 Units Subcutaneous Q4H SCH    meropenem  500 mg Intravenous Q24H    metoprolol tartrate  12.5 mg per NG tube Q12H [facility]    micafungin  100 mg Intravenous Q24H    pantoprazole  40 mg Intravenous BID    sevelamer carbonate  0.8 g Oral TID MEALS    thiamine  100 mg Intravenous Q24H SCH       PHYSICAL EXAM     Vitals:    07/11/22 1200   BP: 124/59   Pulse:  85   Resp: (!) 32   Temp:    SpO2: 95%        Temperature: Temp  Min: 98.1 F (36.7 C)  Max: 99 F (37.2 C)  Pulse: Pulse  Min: 78  Max: 100  Respiratory: Resp  Min: 18  Max: 46  Non-Invasive BP: BP  Min: 97/50  Max: 159/70  Pulse Oximetry SpO2  Min: 92 %  Max: 99 %    Intake and Output Summary (Last 24 hours) at Date Time    Intake/Output Summary (Last 24 hours) at 07/11/2022 1300  Last data filed at 07/11/2022 1200  Gross per 24 hour   Intake 1152.03 ml   Output 0 ml   Net 1152.03 ml         GEN APPEARANCE: Normal;  A&OX0  HEENT: PERLA; EOMI; Conjunctiva Clear  NECK: Supple; No bruits  CVS: RRR, S1, S2; No M/G/R  LUNGS: CTAB; No Wheezes; No Rhonchi: No rales  ABD: Soft; No TTP; + Normoactive BS  EXT: No edema; Pulses 2+ and intact  NEURO: moving UEs spontaneously, moaning intermittently unable to cooperate fully in MS exam  MENTAL STATUS:     LABS     Recent Labs   Lab 07/11/22  0415 07/10/22  0136 07/09/22  1824   WBC 20.96* 23.01* 27.83*   RBC 3.03* 2.79* 3.07*   Hemoglobin 9.4* 8.4* 9.3*   Hematocrit 27.3* 25.2* 27.3*   MCV 90.1 90.3 88.9   Platelet Count 267 239 248       Recent Labs   Lab 07/11/22  0415 07/10/22  0136 07/09/22  0207 07/08/22  0610 07/07/22  2120   Sodium 139 141 141 140 134*   Potassium 3.6 3.6 4.2 4.1 4.0   Chloride 103 103 106 107 103   CO2 21 23 20 19 18    BUN 58* 41* 97* 99* 94*   Creatinine 4.9* 3.6* 6.0* 5.6* 5.3*   Glucose 156* 124* 181* 179* 277*   Calcium 8.4 8.0 8.5 8.4 8.2   Magnesium 2.0 1.9 2.2 2.2 2.3       Recent Labs   Lab 07/07/22  2120 07/06/22  0341   ALT 15  --    AST (SGOT) 23  --    Bilirubin, Total 0.1*  --    Albumin 1.4* 1.3*   Alkaline Phosphatase 69  --                      Microbiology Results (last 15 days)       Procedure Component Value Units Date/Time    Culture, Blood, Aerobic And Anaerobic [[id number]] Collected: 07/07/22 0355    Order Status: Completed Specimen: Blood, Venous Updated: 07/11/22 0701     Culture Blood No growth at 4 days    Culture, Blood, Aerobic And Anaerobic [[id number]] Collected:  07/06/22 1837    Order Status: Completed Specimen: Blood, Venous Updated: 07/11/22 0000     Culture Blood No growth at 4 days    Culture, Sputum and Lower Respiratory [[id number]]     Order Status: Sent Specimen: Sputum, Induced     Culture, Sputum and Lower Respiratory [[id number]]  (Abnormal) Collected: 07/04/22 1034    Order Status: Completed Specimen: Sputum, Suctioned Updated: 07/10/22 2016     Culture Respiratory Heavy growth of Mixed upper respiratory flora      Light growth of Gram negative rod     Comment: Questionable significance due to low quantity.  No  further work.        Gram Stain Few WBCs      Rare Squamous epithelial cells      Few Mixed respiratory flora    Rectal Swab Carbapenemase Surveillance, PCR [[id number]]  (Normal) Collected: 06/29/22 1842    Order Status: Completed Specimen: Swab from Rectum Updated: 06/30/22 0150     IMP gene sequence Not Detected     VIM gene sequence Not Detected     NDM gene sequence Not Detected     KPC gene sequence Not Detected     OXA-48 gene sequence Not Detected    Narrative:      This test utilizes real-time PCR for the detection and differentiation of gene sequences associated with carbapenem-non-susceptibility in Enterobacterales (Enterobacteriaceae), Pseudomonas aeruginosa and Acinetobacter baumannii. Targets include blaKPC, blaNDM, blaVIM, blaOXA-48 (and blaOXA-181), and blaIMP (excluding IMP-7, IMP-13, IMP-14). Detection of these gene sequences does not indicate the presence of viable organisms. This assay does not report variants of these gene sequences. Mutations or polymorphisms may affect detection of current, new, or unknown variants, resulting in a false negative result. This test is for infection prevention surveillance purposes only and should not be used to make treatment decisions. The performance of the Xpert Carba-R Assay has not been evaluated with rectal swab specimens from pediatric patients.             RADIOLOGY     XR Chest AP  Portable    Result Date: 07/10/2022  Interstitial markings have slightly increased, suggesting minimal edema. No other change. Wilmon Pali, MD 07/10/2022 4:49 PM    XR Chest AP Portable    Result Date: 07/07/2022   There is mild interstitial prominence suggesting mild congestive heart failure/fluid overload in the setting of shortness of breath [name], MD 07/07/2022 12:32 PM    XR Chest AP Portable    Result Date: 07/06/2022   Hypoventilation with basilar atelectasis. Charlott Rakes, MD 07/06/2022 3:35 PM    CT Abdomen Pelvis W IV And PO Cont    Result Date: 07/06/2022  1.Postoperative changes from bilateral nephrectomy and cystectomy. 2.Small amount of ascites including some areas which appear loculated. Small collections of fluid and gas in the anterior midline abdomen may be postoperative but superimposed infection is not excluded. No rim-enhancing fluid collections or extravasation of oral contrast to suggest anastomotic leak. Demetrios Isaacs, MD 07/06/2022 5:51 AM    CT Head WO Contrast    Result Date: 07/06/2022   1.No acute intracranial abnormality is seen. Leandro Reasoner, MD 07/06/2022 3:56 AM    XR Abdomen AP    Result Date: 07/05/2022  Enteric tube terminates in the distal stomach. Elease Etienne, DO 07/05/2022 12:24 PM    XR Chest AP Portable    Result Date: 06/30/2022  1. Lines and tubes in adequate position. No pneumothorax. 2. Trace left pleural effusion and bibasilar atelectasis. Jasmine December D'Heureux, MD 06/30/2022 4:53 PM    XR Chest AP Portable    Result Date: 06/30/2022  Catheters as described. Pankaj Dominica, MD 06/30/2022 2:07 PM    XR Chest AP Portable    Result Date: 06/28/2022   Right mainstem bronchial intubation. The tube should be withdrawn about 4 cm. Dr. Tildon Husky is aware. Wynema Birch, MD 06/28/2022 4:56 PM    XR OR MISCOUNT    Result Date: 06/28/2022   No unexpected foreign body. These critical results were discussed with and acknowledged by Angie R., the circulating nurse, who will communicate the findings to  Everardo All, MD on 06/28/2022 3:02 PM. Bosie Helper, MD 06/28/2022 3:03 PM   Echo Results       None          No results found for this or any previous visit.    Signed,  Ester Rink, DO  1:00 PM 07/11/2022

## 2022-07-12 LAB — BASIC METABOLIC PANEL
Anion Gap: 16 — ABNORMAL HIGH (ref 5.0–15.0)
BUN: 77 mg/dL — ABNORMAL HIGH (ref 7–21)
CO2: 21 mEq/L (ref 17–29)
Calcium: 8.4 mg/dL (ref 7.9–10.2)
Chloride: 101 mEq/L (ref 99–111)
Creatinine: 5.9 mg/dL — ABNORMAL HIGH (ref 0.4–1.0)
GFR: 6.6 mL/min/{1.73_m2} — ABNORMAL LOW (ref >=60.0)
Glucose: 150 mg/dL — ABNORMAL HIGH (ref 70–100)
Potassium: 3.9 mEq/L (ref 3.5–5.3)
Sodium: 138 mEq/L (ref 135–145)

## 2022-07-12 LAB — CULTURE BLOOD AEROBIC AND ANAEROBIC
Culture Blood: NO GROWTH
Culture Blood: NO GROWTH

## 2022-07-12 LAB — WHOLE BLOOD GLUCOSE POCT
Whole Blood Glucose POCT: 134 mg/dL — ABNORMAL HIGH (ref 70–100)
Whole Blood Glucose POCT: 143 mg/dL — ABNORMAL HIGH (ref 70–100)
Whole Blood Glucose POCT: 150 mg/dL — ABNORMAL HIGH (ref 70–100)
Whole Blood Glucose POCT: 168 mg/dL — ABNORMAL HIGH (ref 70–100)
Whole Blood Glucose POCT: 174 mg/dL — ABNORMAL HIGH (ref 70–100)
Whole Blood Glucose POCT: 179 mg/dL — ABNORMAL HIGH (ref 70–100)
Whole Blood Glucose POCT: 188 mg/dL — ABNORMAL HIGH (ref 70–100)

## 2022-07-12 LAB — PHOSPHORUS: Phosphorus: 7.2 mg/dL — ABNORMAL HIGH (ref 2.3–4.7)

## 2022-07-12 LAB — CROSSMATCH PRBC, 1 UNIT
Expiration Date: 202407152359
Product Status: TRANSFUSED
Unit Blood Type: O POS

## 2022-07-12 LAB — CBC
Absolute nRBC: 0 10*3/uL (ref <=0.00)
Hematocrit: 24.8 % — ABNORMAL LOW (ref 34.7–43.7)
Hemoglobin: 8.5 g/dL — ABNORMAL LOW (ref 11.4–14.8)
MCH: 31.3 pg (ref 25.1–33.5)
MCHC: 34.3 g/dL (ref 31.5–35.8)
MCV: 91.2 fL (ref 78.0–96.0)
MPV: 11.3 fL (ref 8.9–12.5)
Platelet Count: 282 10*3/uL (ref 142–346)
RBC: 2.72 10*6/uL — ABNORMAL LOW (ref 3.90–5.10)
RDW: 17 % — ABNORMAL HIGH (ref 11–15)
WBC: 17.81 10*3/uL — ABNORMAL HIGH (ref 3.10–9.50)
nRBC %: 0 /100 WBC (ref <=0.0)

## 2022-07-12 LAB — MAGNESIUM: Magnesium: 2 mg/dL (ref 1.6–2.6)

## 2022-07-12 MED ORDER — HYDROMORPHONE HCL 1 MG/ML IJ SOLN
0.4000 mg | INTRAMUSCULAR | Status: DC | PRN
Start: 2022-07-12 — End: 2022-07-12

## 2022-07-12 MED ORDER — SODIUM CHLORIDE 0.9 % IV BOLUS
250.0000 mL | INTRAVENOUS | Status: AC | PRN
Start: 2022-07-12 — End: 2022-07-12

## 2022-07-12 MED ORDER — SODIUM CHLORIDE 0.9 % IV BOLUS
100.0000 mL | INTRAVENOUS | Status: AC | PRN
Start: 2022-07-12 — End: 2022-07-12

## 2022-07-12 MED ORDER — HYDROMORPHONE HCL 2 MG PO TABS
1.0000 mg | ORAL_TABLET | ORAL | Status: DC | PRN
Start: 2022-07-12 — End: 2022-07-19
  Administered 2022-07-12 – 2022-07-16 (×8): 1 mg via ORAL
  Filled 2022-07-12 (×9): qty 1

## 2022-07-12 MED ORDER — ALBUMIN HUMAN 25 % IV SOLN
100.0000 mL | INTRAVENOUS | Status: AC | PRN
Start: 2022-07-12 — End: 2022-07-12

## 2022-07-12 MED ORDER — HYDROMORPHONE HCL 1 MG/ML IJ SOLN
0.2000 mg | Freq: Once | INTRAMUSCULAR | Status: AC
Start: 2022-07-12 — End: 2022-07-12
  Administered 2022-07-12: 0.2 mg via INTRAVENOUS
  Filled 2022-07-12: qty 1

## 2022-07-12 NOTE — Progress Notes (Signed)
Pt's is back from HD, VSS

## 2022-07-12 NOTE — Nursing Progress Note (Addendum)
Hospitalist contacted due to pt's daughter requesting additional pain meds for pt and states she feels and think that the pt is in pain. Explained to daughter that 5mg  of Oxy was given to pt at [facility] as well as scheduled Tylenol given at 2319. Pt has been lethargic and drowsy since beginning of shift. Daughter was requesting for additional 5mg  of Oxy or Morphine to be given to pt. Pt was assessed and was resting in bed. Hospitalist informed and an order for 0.2mg  Dilaudid IV order and given to pt. While on the room, daughter states that she thinks the patient is saying she needs water. Daughter educated on reason why patient cannot have water because of strict NPO order due to the risk of aspiration. Daughter also informed that pt is receiving waster flushes from tube feeding q4hrs. Daughter informed not to given anything to pt without the knowledge of RN. Daughter stated understanding.

## 2022-07-12 NOTE — Progress Notes (Signed)
Arrived in HD room, pt with primary RN and gave report pre-dialysis. Pt on O2@3L /min.  R temporary catheter access optimally working. Daughter with the pt.  Timeouts and safety checks done. Will closely monitor the pt.       07/12/22 0930   Bedside Nurse Communication   Name of bedside RN - pre dialysis Luz Brazen, RN   Treatment Initiation- With Dialysis Precautions   Time Out/Safety Check Completed Yes   Consent for HD signed for this hospitalization (Date) 06/01/22   Blood Consent Verified N/A   Dialysis Precautions All Connections Secured;Saline Line Double Clamped;Venous Parameters Set;Arterial Parameters Set;Air Foam Detecctor Engaged   Dialysis Treatment Type Acute Room   Is patient diabetic? Yes   RO/Hemodialysis Cabin crew   Is Total Chlorine less than 0.1 ppm? Yes   Orignial Total Chlorine Testing Time 0850   At 4 Hour Total Chlorine Testing Time 1250   RO/Hemodialysis Arts development officer Number 7    Machine Serial Number [id number]   RO # 25   RO Serial # [id number]   pH 7.2   Pressure Test Verified Yes   Alarms Verified Passed   Machine Temperature 98.6 F (37 C)   Alarms Verified Yes   Na+ mEq (Machine) 138 mEq   Bicarb mEq (Machine) 35 mEq   Hemodialysis Conductivity (Machine) 13.8   Hemodialysis Conductivity (Meter) 13.8   Dialyzer Lot Number [id number]   Tubing Lot Number [id number]   RO Machine Log Completed Yes   Hepatitis Status   HBsAg (Antigen) Result Negative   HBsAg Date Drawn 07/03/22   HBsAg Repeat Draw Due Date 07/31/22   Dialysis Weight   Pre-Treatment Weight (Kg) 87.8   Scale Type ICU Bed Scale   Vitals   Temp 99 F (37.2 C)   Heart Rate 90   Resp Rate (!) 37   BP 149/68   SpO2 97 %   O2 Device Nasal cannula   Assessment   Mental Status Other (Comment);Does not follow commands   Cardiac Rhythm Atrial fibrillation   R Breath Sounds Diminished   L Breath Sounds Diminished   RLE Edema +1   LLE Edema +1   General Skin Color Appropriate for ethnicity   Skin Condition/Temp  Return to [facility]   Abdomen Inspection Distended   Mobility Bed   Temporary Catheter with Pigtail 06/30/22 Temporary Catheter Right   Placement Date/Time: 06/30/22 1700   Access Type: Temporary Catheter  Orientation: Right  Central Line Infection Prevention Education provided?: Yes  Hand Hygiene: Chlorhexidine;Alcohol based hand scrub  Line cart used?: Yes  Line kit used?: Yes  Maxi...   Line necessity reviewed? Apheresis/hemodialysis   Site Assessment Clean;Dry;Intact   Catheter Lumen Volume Venous 1.2 mL   Catheter Lumen Volume Arterial 1.2 mL   Tego/Curos Caps on Catheter Yes   Dressing Type Transparent;Biopatch   Dressing Status Clean;Dry;Intact   Dressing Change Due 07/13/22   Pain Assessment   Charting Type Assessment   Pain Scale Used CPOT   Hemodialysis Comments   Pre-Hemodialysis Comments Timeout and safety checks done

## 2022-07-12 NOTE — Op Note (Signed)
FULL OPERATIVE NOTE    Date Time: 07/12/22 8:08 PM  Patient Name: Shelby, Barron (MRN: [id number])  Attending Physician: Ester Rink, DO      Date of Operation:   06/30/2022    Providers Performing:   Surgeons and Role:     Marlaine Hind, MD - Primary    Surgical First Assistant(s):   First Assistant: Lynnda Shields    Operative Procedure:   SMALL BOWEL ANASTOMOSIS, CLOSURE OF MESENTERIC DEFECT, CLOSURE OF ABDOMINAL WALL: 38101 (CPT)  WOUND VAC APPLICATION:     Preoperative Diagnosis:   Pre-Op Diagnosis Codes:     * Delayed postoperative wound closure [Z48.1]    Postoperative Diagnosis:   Post-Op Diagnosis Codes:     * Delayed postoperative wound closure [Z48.1]    Anesthesia:   general    Findings:   All small bowel viable, no other small bowel abnormalities noted, side to side stapled small bowel anastomosis, large mesenteric defect in right lower quadrant- closed in running fashion with 3-0 Vicryl, fascia closed with running 0 PDS without any tension, wound vac applied to midline incision     Indications:   Patient left in discontinuity after having small bowel resection secondary to enterotomy at the time of open bilateral nephrectomy and cystectomy when she was found to be hypotensive requiring vasopressor support.  Patient was adequately resuscitated in the ICU and was brought back to the operating room for small bowel anastomosis and closure of the abdomen.    Operative Notes:   After obtaining informed consent, patient was brought to the operating room and placed on the operating table in supine position.  General anesthesia was administered.  The ABThera wound VAC was removed leaving only the plastic covering over the bowel.  The abdomen was prepped and draped in usual sterile fashion.  Preoperative antibiotics were given.  A timeout was performed verifying patient, procedure, site, and special equipment needed for the procedure.    The plastic covering was removed.  The small bowel was eviscerated  and inspected.  The site of prior serosal injury repair was inspected and the repair was found to be intact.  Small bowel was run both proximally and distally in its entirety and no other abnormalities noted.  The bilateral nephrectomy beds and the pelvis were inspected and no bleeding or any other abnormality noted.  However, a large mesenteric defect seen in right lower quadrant that had been created at the time of initial surgery by the primary surgeons.  The mesenteric defect was closed using 3-0 Vicryl in running fashion.    Next, an end-to-end stapled anastomosis was created in a side-to-side fashion.  Enterotomies were made at the antimesenteric borders, Echelon stapler with 60 mm staple load inserted and fired.  The lumen was inspected for hemostasis.  The enterotomies were closed with a single firing of a TA stapler with a blue load.  The mesenteric defect was closed using 3-0 Vicryl in running fashion.  The small bowel was placed back into the abdomen.  Seprafilm was placed over the bowel along the midline incision    Next, the fascia was closed using 0 PDS in running fashion.  An wound VAC with black sponge was placed over the midline incision.  All instrument and sponge counts were correct at the conclusion of the case.    The patient was kept intubated at the conclusion of the case and brought to the ICU in stable condition.  Estimated Blood Loss:   20 mL    Implants:     Implant Name Type Inv. Item Serial No. Model No. Manufacturer Lot No. LRB No. Used Action   BARRIER ADH SOD HALUR CMC SPRFLM 6X5IN - [id number] Sheet BARRIER ADH SOD HALUR CMC SPRFLM 6X5IN  [id number] BAXTER [id number] N/A 1 Implanted   BARRIER ADH SOD HALUR CMC SPRFLM 6X5IN - [id number] Sheet BARRIER ADH SOD HALUR CMC SPRFLM 6X5IN  [id number] BAXTER [id number] N/A 1 Implanted          Drains:   Drains: no      Specimens:     ID Type Source Tests Collected by Time Destination   1 : mesh Implanted Device Implanted Device  (Specify if applicable) NO PATHOLOGY Marlaine Hind, MD 06/30/2022 1058          Complications:   None    Signed by: Marlaine Hind, MD  [facility]

## 2022-07-12 NOTE — Progress Notes (Signed)
Pt is disoriented and do not follow commands.  Pt on O2@3L /min. HR on and off 118 - 130's during HD tx.  R temporary catheter access is optimally working post dialysis.  Hemodialysis ended and pt. to finish tx.  Able to removed Net Fluid of 1.8L.  Reports were given to Primary nurse.       07/12/22 1248   Treatment Summary   Time Off Machine 1235   Duration of Treatment (Hours) 3   Treatment Type 1:1   Dialyzer Clearance Moderately streaked   Fluid Volume Off (mL) 2250   Prime Volume (mL) 200   Rinseback Volume (mL) 250   Fluid Given: Normal Saline (mL) 0   Fluid Given: PRBC  0 mL   Fluid Given: Albumin (mL) 0   Fluid Given: Other (mL) 0   Total Fluid Given 450   Hemodialysis Net Fluid Removed 1800   Post Treatment Assessment   Post-Treatment Weight (Kg) -1.8   Patient Response to Treatment fairly tolerated   Additional Dialyzer Used 0   Temporary Catheter with Pigtail 06/30/22 Temporary Catheter Right   Placement Date/Time: 06/30/22 1700   Access Type: Temporary Catheter  Orientation: Right  Central Line Infection Prevention Education provided?: Yes  Hand Hygiene: Chlorhexidine;Alcohol based hand scrub  Line cart used?: Yes  Line kit used?: Yes  Maxi...   Line necessity reviewed? Apheresis/hemodialysis   Site Assessment Clean;Dry;Intact   Catheter Lumen Volume Venous 1.2 mL   Catheter Lumen Volume Arterial 1.2 mL   Tego/Curos Caps on Catheter Yes   Dressing Type Transparent;Biopatch   Dressing Status Clean;Dry;Intact   Dressing Change Due 07/13/22   Vitals   Temp 98.9 F (37.2 C)   Heart Rate (!) 110   Resp Rate (!) 24   BP 121/60   SpO2 99 %   O2 Device Nasal cannula   Assessment   Mental Status Other (Comment);Does not follow commands;Agitated;Disoriented   Cardiac Rhythm Atrial fibrillation   Respiratory Pattern Dyspnea with exertion   R Breath Sounds Diminished   L Breath Sounds Diminished   RLE Edema +1   LLE Edema +1   General Skin Color Appropriate for ethnicity   Abdomen Inspection Distended   Mobility  Bed   Pain Assessment   Charting Type Reassessment   Pain Scale Used CPOT   Education   Person taught Other (Comments)  (daughter)   Knowledge basis Minimal   Topics taught Procedure   Teaching Tools Explain   Reponse Needs Reinforcement   Bedside Nurse Communication   Name of bedside RN - post dialysis Luz Brazen, RN

## 2022-07-12 NOTE — UM Notes (Signed)
[facility] Utilization Review   NPI #[id number], Tax ID [id number]  Please call Fuller Plan MSN RN CCM @  [phone]  with any questions or concerns.  Email:  [email or web address]  Fax final authorization and requests for additional information to [phone]    CONCURRENT REVIEW FOR: 6/23-6/24    LOC: IMCU    PATIENT NAME: Shelby Barron,Shelby Barron / April 13, 1940 / AGE: 82 y.o.      S/I: LEUKOCYTOSIS; ON 2L-3LMIN NC; FEVER; TACHYPNEA;AMS  SUBJECTIVE: Shelby Barron moaning, not responding appropriately     V/S:    Vital Sign Min/Max (last 24 hours)    Value Min Max   Temp 98.6 F (37 C) 100.3 F (37.9 C)   Heart Rate 81 128 Abnormal    Resp Rate 20 47 Abnormal    BP: Systolic 117 154   BP: Diastolic 54 100 Abnormal    SpO2 93 % 100 %          Labs -   Latest Reference Range & Units 07/11/22 04:15 07/12/22 04:18   WBC 3.10 - 9.50 x10 3/uL 20.96 (H) 17.81 (H)   Hemoglobin 11.4 - 14.8 g/dL 9.4 (L) 8.5 (L)   Hematocrit 34.7 - 43.7 % 27.3 (L) 24.8 (L)   RBC 3.90 - 5.10 x10 6/uL 3.03 (L) 2.72 (L)        Latest Reference Range & Units 07/11/22 04:15   Glucose 70 - 100 mg/dL 403 (H)   BUN 7 - 21 mg/dL 58 (H)   Creatinine 0.4 - 1.0 mg/dL 4.9 (H)   EGFR >=47.4 QV/ZDG/3.87 m2 8.2 (L)   Phosphorus 2.3 - 4.7 mg/dL 6.5 (H)      Latest Reference Range & Units 07/12/22 04:18   Glucose 70 - 100 mg/dL 564 (H)   BUN 7 - 21 mg/dL 77 (H)   Creatinine 0.4 - 1.0 mg/dL 5.9 (H)   Anion Gap 5.0 - 15.0  16.0 (H)   EGFR >=60.0 mL/min/1.73 m2 6.6 (L)   Phosphorus 2.3 - 4.7 mg/dL 7.2 (H)       MD NOTES:  PER NEPHROLOGY  Recommendations:   HD today and tomorrow  HD tomorrow  Permcath when okay with ID and closer to d/c  Goals of care discussion  Start renvela packet q8h   Change tube feeds to Nepro  Wean O2       PER MEDICINE ON 6/24  Patient Active Hospital Problem List:  #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 Went to the OR on June 12 for small bowel anastomosis, closure of mesenteric defect, closure of abdominal wall with wound VAC application  - Failed SLP eval . Wcont tube feeds per nutrition recs  -Wound VAC changed 6/15  - Surgery consulted, recommend CTAP with PO contrast to eval for intra-abdominal source of infection given uptrending WBC. Surgery ordered pre-medication with steroid/benadryl given h/o contrast allergy  - trend CBC, transfuse Hgb<7. Transfused 3 units PRBC in OR. Received another unit on 6/11 and 6/12  - TTE as part of preop cardiac clearance which showed Mild aortic stenosis (AVA 1.7), grade 1 diastolic dysfunction, normal LV/RV systolic function  - ID consulted, on meropenem and micafungin     #History of CVA  #Encephalopathy/delirium  - CT head pending  - Hold home plavix  - Adhere to day/night cycles and administer constant reorientation.   - CAM  ICU monitoring with early initiation of atypical antipsychotics as needed for control of agitated delirium.   - Out of bed to chair with PT/OT consult as indicated.      #Acute hypoxemic respiratory failure  #COPD  Extubated on 6/13, currently on 2L NC   Maintain SaO2 > 92%     #ESRD  HD Tues/Thurs/Sat  Will need eventual permcath exchange this week      #HTN  On metoprolol  Off nicardipine gtt, PRN IV labetalol for SBP > 180      New onset Afib with RVR  - cardiology following  - stable now on amiodarone gtt and metoprolol     #Renal cell carcinoma  #Urothelial carcinoma  #Thrombocytopenia  - VTE ppx: SCDs, subcutaneous heparin     #Diabetes Mellitus type 2  -Sliding scale insulin        Nutrition: NPO, tube feeds  Malnutrition Documentation     Dispo: pending improved mental status    Current Medications:    Scheduled Meds:  Current Facility-Administered Medications   Medication Dose Route Frequency    acetaminophen  650 mg Oral 4 times per day    albuterol-ipratropium  3 mL Nebulization Q6H WA    glycopyrrolate  0.2 mg Intravenous Q8H     insulin glargine  7 Units Subcutaneous QAM    insulin lispro  2-10 Units Subcutaneous Q4H SCH    meropenem  500 mg Intravenous Q24H    metoprolol tartrate  12.5 mg per NG tube Q12H [facility]    micafungin  100 mg Intravenous Q24H    pantoprazole  40 mg Intravenous BID    sevelamer carbonate  0.8 g Oral TID MEALS    thiamine  100 mg Intravenous Q24H SCH     Continuous Infusions:   amiodarone 0.5 mg/min (07/12/22 1343)

## 2022-07-12 NOTE — Plan of Care (Signed)
Major event: HD done today, 1.8 L of fluid has been taken out as per the report, WOCN changed the abdominal wound vac by WOCN. NO major output from the vac. Pt daughter @ bedside, education and care plan has been discussed w/ her.   Neuro:  pt non verbal,  follows commands, can moves all extremities, overcomes gravity. Gag and cough preset. Intermittently moans, PRN oxycodone and dilaudid has been given for pain and comfort.   CV: A fib to sinus arrhthymias,  70's to 100's, SBP: 120's to 180's. Palpable pulses, BLE edema +1/+2 pitting. Cont infusion amiodarone gtt @ 0.5  Resp: On 3 L NC, small oral secretion, evidence of retained secretion @ back of throat however it is getting better, neb treatment given by RT,  coarse  B/L lungs sounds.   GI: 1 x BM this shift, q4h Blood glucose  check. Cont TF via NG tube @ rate of 45, and 100 ml q4h water flushes.   GU: Anuric.   Skin: surgical incision @ abdomen, w/ wound vac, no output @ wound vac.      Safety and fall precaution remain in place. Purposeful rounding completed.   Problem: Moderate/High Fall Risk Score >5  Goal: Patient will remain free of falls  Outcome: Progressing  Flowsheets (Taken 07/12/2022 0800)  High (Greater than 13):   HIGH-Visual cue at entrance to patient's room   HIGH-Bed alarm on at all times while patient in bed   HIGH-Utilize chair pad alarm for patient while in the chair   HIGH-Apply yellow "Fall Risk" arm band   HIGH-Initiate use of floor mats as appropriate   HIGH-Consider use of low bed   HIGH-Pharmacy to initiate evaluation and intervention per protocol     Problem: Compromised Sensory Perception  Goal: Sensory Perception Interventions  Outcome: Progressing  Flowsheets (Taken 07/12/2022 0800)  Sensory Perception Interventions: Offload heels, Pad bony prominences, Reposition q 2hrs/turn Clock, Q2 hour skin assessment under devices if present     Problem: Compromised Moisture  Goal: Moisture level Interventions  Outcome: Progressing  Flowsheets  (Taken 07/12/2022 0800)  Moisture level Interventions: Moisture wicking products, Moisture barrier cream     Problem: Compromised Activity/Mobility  Goal: Activity/Mobility Interventions  Outcome: Progressing  Flowsheets (Taken 07/12/2022 0800)  Activity/Mobility Interventions: Pad bony prominences, TAP Seated positioning system when OOB, Promote PMP, Reposition q 2 hrs / turn clock, Offload heels     Problem: Compromised Nutrition  Goal: Nutrition Interventions  Outcome: Progressing  Flowsheets (Taken 07/12/2022 0800)  Nutrition Interventions: Discuss nutrition at Rounds, I&Os, Document % meal eaten, Daily weights     Problem: Compromised Friction/Shear  Goal: Friction and Shear Interventions  Outcome: Progressing  Flowsheets (Taken 07/12/2022 0800)  Friction and Shear Interventions: Pad bony prominences, Off load heels, HOB 30 degrees or less unless contraindicated, Consider: TAP seated positioning, Heel foams     Problem: SCIP  Goal: SCIP measures are followed  Outcome: Progressing  Flowsheets (Taken 07/11/2022 1104)  SCIP measures are followed:   Administer antibiotics as ordered   VTE Prevention: Administer anticoagulant(s) and/or apply anti-embolism stockings/devices as ordered   Provide VTE prophylaxis within 24 hours of anesthesia end time (Note: SCD to lower extremities)   Remove Foley catheter per order/protocol     Problem: Inadequate Airway Clearance  Goal: Patent Airway maintained  Outcome: Progressing  Flowsheets (Taken 07/11/2022 1104)  Patent airway maintained:   Position patient for maximum ventilatory efficiency   Provide adequate fluid intake to liquefy secretions  Reinforce use of ordered respiratory interventions (i.e. CPAP, BiPAP, Incentive Spirometer, Acapella, etc.)   Reposition patient every 2 hours and as needed unless able to self-reposition   Suction secretions as needed  Goal: Normal respiratory rate/effort achieved/maintained  Outcome: Progressing  Flowsheets (Taken 07/11/2022 1104)  Normal  respiratory rate/effort achieved/maintained: Plan activities to conserve energy: plan rest periods     Problem: Infection Prevention  Goal: Free from infection  Outcome: Progressing  Flowsheets (Taken 07/11/2022 1104)  Free from infection:   Monitor/assess vital signs   Encourage/assist patient to turn, cough and perform deep breathing every 2 hours   Assess incision for evidence of healing   Monitor/assess lab values and report abnormal values   Monitor/assess output from surgical drain if present   Assess for signs and symptoms of infection     Problem: Impaired Mobility  Goal: Mobility/Activity is maintained at optimal level for patient  Outcome: Progressing  Flowsheets (Taken 07/11/2022 1104)  Mobility/activity is maintained at optimal level for patient:   Encourage independent activity per ability   Consult/collaborate with Physical Therapy and/or Occupational Therapy     Problem: Constipation  Goal: Fluid and electrolyte balance are achieved/maintained  Outcome: Progressing  Flowsheets (Taken 07/11/2022 1104)  Fluid and electrolyte balance are achieved/maintained:   Monitor/assess lab values and report abnormal values   Assess and reassess fluid and electrolyte status   Observe for cardiac arrhythmias   Monitor for muscle weakness  Goal: Elimination patterns are normal or improving  Outcome: Progressing     Problem: Safety  Goal: Patient will be free from injury during hospitalization  Outcome: Progressing  Flowsheets (Taken 07/12/2022 1102)  Patient will be free from injury during hospitalization:   Assess patient's risk for falls and implement fall prevention plan of care per policy   Use appropriate transfer methods   Include patient/ family/ care giver in decisions related to safety   Assess for patients risk for elopement and implement Elopement Risk Plan per policy   Provide alternative method of communication if needed (communication boards, writing)   Hourly rounding   Ensure appropriate safety devices are  available at the bedside   Provide and maintain safe environment  Goal: Patient will be free from infection during hospitalization  Outcome: Progressing  Flowsheets (Taken 07/12/2022 1102)  Free from Infection during hospitalization:   Assess and monitor for signs and symptoms of infection   Monitor all insertion sites (i.e. indwelling lines, tubes, urinary catheters, and drains)   Encourage patient and family to use good hand hygiene technique   Monitor lab/diagnostic results     Problem: Pain  Goal: Pain at adequate level as identified by patient  Outcome: Progressing  Flowsheets (Taken 07/12/2022 0800)  Pain at adequate level as identified by patient:   Reassess pain within 30-60 minutes of any procedure/intervention, per Pain Assessment, Intervention, Reassessment (AIR) Cycle   Offer non-pharmacological pain management interventions   Evaluate patient's satisfaction with pain management progress     Problem: Side Effects from Pain Analgesia  Goal: Patient will experience minimal side effects of analgesic therapy  Outcome: Progressing  Flowsheets (Taken 07/12/2022 0800)  Patient will experience minimal side effects of analgesic therapy:   Assess for changes in cognitive function   Prevent/manage side effects per LIP orders (i.e. nausea, vomiting, pruritus, constipation, urinary retention, etc.)     Problem: Hemodynamic Status: Cardiac  Goal: Stable vital signs and fluid balance  Outcome: Progressing  Flowsheets (Taken 07/11/2022 1104)  Stable vital signs and  fluid balance:   Assess signs and symptoms associated with cardiac rhythm changes   Monitor lab values     Problem: Renal Instability  Goal: Fluid and electrolyte balance are achieved/maintained  Outcome: Progressing  Flowsheets (Taken 07/12/2022 1102)  Fluid and electrolyte balance are achieved/maintained:   Monitor/assess lab values and report abnormal values   Observe for cardiac arrhythmias   Monitor for muscle weakness   Assess and reassess fluid and  electrolyte status   Follow fluid restrictions/IV/PO parameters     Problem: Patient Receiving Advanced Renal Therapies  Goal: Therapy access site remains intact  Outcome: Progressing  Flowsheets (Taken 07/12/2022 1102)  Therapy access site remains intact:   Assess therapy access site   Change therapy access site dressing as needed     Problem: Fluid and Electrolyte Imbalance/ Endocrine  Goal: Fluid and electrolyte balance are achieved/maintained  Outcome: Progressing  Flowsheets (Taken 07/11/2022 1104)  Fluid and electrolyte balance are achieved/maintained:   Monitor/assess lab values and report abnormal values   Assess and reassess fluid and electrolyte status   Observe for cardiac arrhythmias   Monitor for muscle weakness  Goal: Adequate hydration  Outcome: Progressing  Flowsheets (Taken 07/11/2022 1104)  Adequate hydration:   Assess mucus membranes, skin color, turgor, perfusion and presence of edema   Assess for peripheral, sacral, periorbital and abdominal edema   Monitor and assess vital signs and perfusion     Problem: Diabetes: Glucose Imbalance  Goal: Blood glucose stable at established goal  Outcome: Progressing  Flowsheets (Taken 07/11/2022 1104)  Blood glucose stable at established goal:   Monitor lab values   Monitor intake and output.  Notify LIP if urine output is < 30 mL/hour.   Include patient/family in decisions related to nutrition/dietary selections   Monitor/assess vital signs   Ensure adequate hydration   Ensure patient/family has adequate teaching materials   Ensure appropriate consults are obtained (Nutrition, Diabetes Education, and Case Management)   Assess for hypoglycemia /hyperglycemia   Ensure appropriate diet and assess tolerance   Follow fluid restrictions/IV/PO parameters   Coordinate medication administration with meals, as indicated     Problem: Inadequate Gas Exchange  Goal: Patent Airway maintained  Outcome: Progressing  Flowsheets (Taken 07/11/2022 1104)  Patent airway maintained:    Position patient for maximum ventilatory efficiency   Provide adequate fluid intake to liquefy secretions   Reinforce use of ordered respiratory interventions (i.e. CPAP, BiPAP, Incentive Spirometer, Acapella, etc.)   Reposition patient every 2 hours and as needed unless able to self-reposition   Suction secretions as needed  Goal: Adequate oxygenation and improved ventilation  Outcome: Progressing  Flowsheets (Taken 07/11/2022 1104)  Adequate oxygenation and improved ventilation:   Assess lung sounds   Monitor SpO2 and treat as needed   Plan activities to conserve energy: plan rest periods   Consult/collaborate with Respiratory Therapy   Teach/reinforce use of incentive spirometer 10 times per hour while awake, cough and deep breath as needed   Provide mechanical and oxygen support to facilitate gas exchange   Monitor and treat ETCO2   Position for maximum ventilatory efficiency   Increase activity as tolerated/progressive mobility     Problem: Neurological Deficit  Goal: Neurological status is stable or improving  Outcome: Progressing  Flowsheets (Taken 07/12/2022 1102)  Neurological status is stable or improving:   Monitor/assess/document neurological assessment (Stroke: every 4 hours)   Perform CAM Assessment     Problem: Potential for Aspiration  Goal: Risk of aspiration will be  minimized  Outcome: Progressing  Flowsheets (Taken 07/12/2022 1102)  Risk of aspiration will be minimized:   Assess/monitor ability to swallow using dysphagia screen: Keep patient NPO if patient fails screening   Place swallow precaution signage above bed and supervise patient during oral intake   Monitor/assess for signs of aspiration (tachypnea, cough, wheezing, clearing throat, hoarseness after eating, decrease in SaO2)   Consult/collaborate/follow recommended modified texture diet/thicken liquids as indicated by Speech Pathologist   Place patient up in chair to eat, if possible/head of bed up 90 degrees to eat if unable to be out of  bed   Instruct patient to take small bites, small single sips of liquid, and do not use a straw

## 2022-07-12 NOTE — Progress Notes (Signed)
Pt is transferred to dialysis unit for dialysis w/ cont infusion of amiodarone gtt and TF, hand off has been given to Dudley, Charity fundraiser

## 2022-07-12 NOTE — Progress Notes (Addendum)
[facility]  PROGRESS NOTE      Patient: Shelby Barron  Date:07/08/2022   LOS: 14 Days  Admission Date: 06/28/2022   MRN: [id number]  Attending: Ester Rink, DO  When on service as the attending, please contact me on Epic Secure Chat from 7 AM - 7 PM for non-urgent issues. For urgent matters use XTend page from 7 AM - 7 PM.       ASSESSMENT/PLAN     Shelby Barron is a 82 y.o. female admitted with Bladder cancer    Interval Summary: [name], [name], type 2 diabetes, renal cell carcinoma status post partial bilateral nephrectomy, [name], new diagnosis of urothelial cancer initally presented on 6/10 for elective cystectomy and completion nephrectomy, [name], [name], extubated and off pressors since 6/13.     She is hypoactive and delirious. Surgery recommended NGT and CTAP today. Plan to start TF today. CTH pending.  Has [name] T/Th/Sat. Nephrology following     Developed new onset afib RVR today during HD. Stopped norvasc switched to metoprolol and gave additional IV metoprolol. Consulted cardiology. Discussed with ICU requested a consult given deterioration. Recommended repeat blood cultures and broadening antibiotics to meropenem. Palliative care consulted.    Rising WBC. Blood cultures NGTD. Patient tachypneic CXR with pulm edema. Discussed with nephrology giving 3 mg IV bumex. Had dark emesis. Vitals stable. TFs held, started IV PPI, follow AM labs. Discussed with GI and surgery.    No further episodes of Afib RVR now on amiodarone gtt and PO metoprolol. Hgb stable. Repeating CXR, clear. Started robinul for upper airway secretions/rattling. Clinically improving.    New permacath placement planned for this week. Then hopefully NGT can come out if patient tolerating PO and discharge planning.    Patient Active Hospital Problem List:  #S/P Cystectomy and completion  nephrectomy complicated by small bowel enterotomy   #Mixed shock - hemorrhagic vs septic: Resolved  - 6/10 small bowel enteretomy with 1L EBL. Went to the OR on June 12 for small bowel anastomosis, closure of mesenteric defect, closure of abdominal wall with wound VAC application  - Failed SLP eval . Wcont tube feeds per nutrition recs  -Wound VAC changed 6/15  - Surgery consulted, recommend CTAP with PO contrast to eval for intra-abdominal source of infection given uptrending WBC. Surgery ordered pre-medication with steroid/benadryl given h/o contrast allergy  - trend CBC, transfuse Hgb<7. Transfused 3 units PRBC in OR. Received another unit on 6/11 and 6/12  - TTE as part of preop cardiac clearance which showed Mild aortic stenosis (AVA 1.7), grade 1 diastolic dysfunction, normal LV/RV systolic function  - ID consulted, on meropenem and micafungin     #History of CVA  #Encephalopathy/delirium  - CT head pending  - Hold home plavix  - Adhere to day/night cycles and administer constant reorientation.   - CAM ICU monitoring with early initiation of atypical antipsychotics as needed for control of agitated delirium.   - Out of bed to chair with PT/OT consult as indicated.      #Acute hypoxemic respiratory failure  #COPD  Extubated on 6/13, currently on 2L NC   Maintain SaO2 > 92%     #ESRD  HD Tues/Thurs/Sat  Will need eventual permcath exchange this week     #HTN  On metoprolol  Off nicardipine gtt,  PRN IV labetalol for SBP > 180     New onset Afib with RVR  - cardiology following  - stable now on amiodarone gtt and metoprolol    #Renal cell carcinoma  #Urothelial carcinoma  #Thrombocytopenia  - VTE ppx: SCDs, subcutaneous heparin     #Diabetes Mellitus type 2  -Sliding scale insulin          Nutrition: NPO, tube feeds  Malnutrition Documentation    Moderate Malnutrition related to inadequate nutritional intake in the setting of acute illness as evidenced by <50% EER > 5 days, mild muscle losses (temporalis, deltoid)  - new                 Patient has BMI=Body mass index is 35.4 kg/m.  Diagnosis: Obesity based on BMI criteria                         Recent Labs   Lab 07/12/22  0418 07/11/22  0415 07/10/22  0136   Hemoglobin 8.5* 9.4* 8.4*   Hematocrit 24.8* 27.3* 25.2*   MCV 91.2 90.1 90.3   WBC 17.81* 20.96* 23.01*   Platelet Count 282 267 239     Recent Labs   Lab 06/02/22  0514   Iron 62   TIBC 145*   Iron Saturation 43       Code Status: Full Code    Dispo: pending improved mental status    Family Contact: none    DVT Prophylaxis:   Current Facility-Administered Medications (Includes Only Anticoagulants, Misc. Hematological)   Medication Dose Route Last Admin   None          CHART  REVIEW & DISCUSSION     The following chart items were reviewed as of 5:06 PM on 07/12/22:  []  Lab Results []  Imaging Results   []  Problem List  []  Current Orders []  Current Medications  []  Allergies  []  Code Status []  Previous Notes   []  SDoH    The management and plan of care for this patient was discussed with the following specialty consultants:  []  Cardiology  []  Gastroenterology                 []  Infectious Disease  []  Pulmonology []  Neurology                [x]  Nephrology  []  Neurosurgery []  Orthopedic Surgery  []  Heme/Onc  []  General Surgery []  Psychiatry                                   []  Palliative    SUBJECTIVE     [name] moaning, not responding appropriately   Family updated bedside    MEDICATIONS     Current Facility-Administered Medications   Medication Dose Route Frequency    acetaminophen  650 mg Oral 4 times per day    albuterol-ipratropium  3 mL Nebulization Q6H WA    glycopyrrolate  0.2 mg Intravenous Q8H    insulin glargine  7 Units Subcutaneous QAM    insulin lispro  2-10 Units Subcutaneous Q4H SCH    meropenem  500 mg Intravenous Q24H    metoprolol tartrate  12.5 mg per NG tube Q12H [facility]    micafungin  100 mg Intravenous Q24H    pantoprazole  40 mg Intravenous BID    sevelamer carbonate  0.8 g Oral TID  MEALS     thiamine  100 mg Intravenous Q24H SCH       PHYSICAL EXAM     Vitals:    07/12/22 1600   BP: 152/71   Pulse: (!) 103   Resp: (!) 32   Temp: 100.3 F (37.9 C)   SpO2: 95%       Temperature: Temp  Min: 98.6 F (37 C)  Max: 100.3 F (37.9 C)  Pulse: Pulse  Min: 81  Max: 128  Respiratory: Resp  Min: 20  Max: 47  Non-Invasive BP: BP  Min: 117/77  Max: 154/75  Pulse Oximetry SpO2  Min: 93 %  Max: 100 %    Intake and Output Summary (Last 24 hours) at Date Time    Intake/Output Summary (Last 24 hours) at 07/12/2022 1706  Last data filed at 07/12/2022 1600  Gross per 24 hour   Intake 425 ml   Output 1800 ml   Net -1375 ml         GEN APPEARANCE: Normal;  A&OX0  HEENT: PERLA; EOMI; Conjunctiva Clear  NECK: Supple; No bruits  CVS: RRR, S1, S2; No M/G/R  LUNGS: CTAB; No Wheezes; No Rhonchi: No rales  ABD: Soft; No TTP; + Normoactive BS  EXT: No edema; Pulses 2+ and intact  NEURO: moving UEs spontaneously, moaning intermittently unable to cooperate fully in MS exam  MENTAL STATUS:     LABS     Recent Labs   Lab 07/12/22  0418 07/11/22  0415 07/10/22  0136   WBC 17.81* 20.96* 23.01*   RBC 2.72* 3.03* 2.79*   Hemoglobin 8.5* 9.4* 8.4*   Hematocrit 24.8* 27.3* 25.2*   MCV 91.2 90.1 90.3   Platelet Count 282 267 239       Recent Labs   Lab 07/12/22  0418 07/11/22  0415 07/10/22  0136 07/09/22  0207 07/08/22  0610   Sodium 138 139 141 141 140   Potassium 3.9 3.6 3.6 4.2 4.1   Chloride 101 103 103 106 107   CO2 21 21 23 20 19    BUN 77* 58* 41* 97* 99*   Creatinine 5.9* 4.9* 3.6* 6.0* 5.6*   Glucose 150* 156* 124* 181* 179*   Calcium 8.4 8.4 8.0 8.5 8.4   Magnesium 2.0 2.0 1.9 2.2 2.2       Recent Labs   Lab 07/07/22  2120 07/06/22  0341   ALT 15  --    AST (SGOT) 23  --    Bilirubin, Total 0.1*  --    Albumin 1.4* 1.3*   Alkaline Phosphatase 69  --                      Microbiology Results (last 15 days)       Procedure Component Value Units Date/Time    Culture, Blood, Aerobic And Anaerobic [[id number]] Collected: 07/07/22 0355     Order Status: Completed Specimen: Blood, Venous Updated: 07/12/22 0700     Culture Blood No growth at 5 days    Culture, Blood, Aerobic And Anaerobic [[id number]] Collected: 07/06/22 1837    Order Status: Completed Specimen: Blood, Venous Updated: 07/12/22 0000     Culture Blood No growth at 5 days    Culture, Sputum and Lower Respiratory [[id number]]     Order Status: Sent Specimen: Sputum, Induced     Culture, Sputum and Lower Respiratory [[id number]]  (Abnormal) Collected: 07/04/22 1034    Order Status: Completed Specimen: Sputum, Suctioned  Updated: 07/10/22 2016     Culture Respiratory Heavy growth of Mixed upper respiratory flora      Light growth of Gram negative rod     Comment: Questionable significance due to low quantity.  No further work.        Gram Stain Few WBCs      Rare Squamous epithelial cells      Few Mixed respiratory flora    Rectal Swab Carbapenemase Surveillance, PCR [[id number]]  (Normal) Collected: 06/29/22 1842    Order Status: Completed Specimen: Swab from Rectum Updated: 06/30/22 0150     IMP gene sequence Not Detected     VIM gene sequence Not Detected     NDM gene sequence Not Detected     KPC gene sequence Not Detected     OXA-48 gene sequence Not Detected    Narrative:      This test utilizes real-time PCR for the detection and differentiation of gene sequences associated with carbapenem-non-susceptibility in Enterobacterales (Enterobacteriaceae), Pseudomonas aeruginosa and Acinetobacter baumannii. Targets include blaKPC, blaNDM, blaVIM, blaOXA-48 (and blaOXA-181), and blaIMP (excluding IMP-7, IMP-13, IMP-14). Detection of these gene sequences does not indicate the presence of viable organisms. This assay does not report variants of these gene sequences. Mutations or polymorphisms may affect detection of current, new, or unknown variants, resulting in a false negative result. This test is for infection prevention surveillance purposes only and should not be used to make treatment  decisions. The performance of the Xpert Carba-R Assay has not been evaluated with rectal swab specimens from pediatric patients.             RADIOLOGY     XR Chest AP Portable    Result Date: 07/10/2022  Interstitial markings have slightly increased, suggesting minimal edema. No other change. Wilmon Pali, MD 07/10/2022 4:49 PM    XR Chest AP Portable    Result Date: 07/07/2022   There is mild interstitial prominence suggesting mild congestive heart failure/fluid overload in the setting of shortness of breath [name], MD 07/07/2022 12:32 PM    XR Chest AP Portable    Result Date: 07/06/2022   Hypoventilation with basilar atelectasis. Charlott Rakes, MD 07/06/2022 3:35 PM    CT Abdomen Pelvis W IV And PO Cont    Result Date: 07/06/2022  1.Postoperative changes from bilateral nephrectomy and cystectomy. 2.Small amount of ascites including some areas which appear loculated. Small collections of fluid and gas in the anterior midline abdomen may be postoperative but superimposed infection is not excluded. No rim-enhancing fluid collections or extravasation of oral contrast to suggest anastomotic leak. Demetrios Isaacs, MD 07/06/2022 5:51 AM    CT Head WO Contrast    Result Date: 07/06/2022   1.No acute intracranial abnormality is seen. Leandro Reasoner, MD 07/06/2022 3:56 AM    XR Abdomen AP    Result Date: 07/05/2022  Enteric tube terminates in the distal stomach. Elease Etienne, DO 07/05/2022 12:24 PM    XR Chest AP Portable    Result Date: 06/30/2022  1. Lines and tubes in adequate position. No pneumothorax. 2. Trace left pleural effusion and bibasilar atelectasis. Jasmine December D'Heureux, MD 06/30/2022 4:53 PM    XR Chest AP Portable    Result Date: 06/30/2022  Catheters as described. Pankaj Dominica, MD 06/30/2022 2:07 PM    XR Chest AP Portable    Result Date: 06/28/2022   Right mainstem bronchial intubation. The tube should be withdrawn about 4 cm. Dr. Tildon Husky is aware. Wynema Birch, MD 06/28/2022  4:56 PM    XR OR MISCOUNT    Result Date:  06/28/2022   No unexpected foreign body. These critical results were discussed with and acknowledged by Angie R., the circulating nurse, who will communicate the findings to Everardo All, MD on 06/28/2022 3:02 PM. Bosie Helper, MD 06/28/2022 3:03 PM   Echo Results       None          No results found for this or any previous visit.    Signed,  Ester Rink, DO  5:06 PM 07/12/2022

## 2022-07-12 NOTE — Progress Notes (Signed)
Nutritional Support Services  Nutrition Follow-up    Shelby Barron 82 y.o. female   MRN: [id number]    Summary of Nutrition Recommendations:    Continue Glucerna 1.5 @ 15ml/hr x 24hrs/day  Provides 1620 kcals, 89g protein, free H2O  Flush Prosource 1 pkt daily- Each packet provides 80 kcals, 20 g protein    Flush tube with minimum of 100 ml water Q 4 hours for tube patency- additional fluids for hydration per MD/DO  Total EN regimen provides 1700 kcals, 109g protein, H2O  Meets 100% kcal/protein needs  Monitor pt's ability to take PO  Monitor wt trends.     -----------------------------------------------------------------------------------------------------------------                                                       ASSESSMENT DATA     Subjective Nutrition:   Pt unable to provide nutrition history due to mentation. Family present and denied any questions/nutrition concerns. Pt tolerating feeds well. Observed TF infusing at goal rate of 45 ml/hr. Per pump hx, pt received 975 ml TF/ 525 ml flush x 24 hrs, 1371 ml TF/ 725 ml flush x 48 hrs; pump cleared prior to 42 hours ago (held for procedure). Daily average TF infusion with Prosource daily met 71% kcal needs and 74% protein needs past 2 days. TF indicates feeds held 2 hours yesterday likely with daily care. Will monitor need to increase rate to account for feeds bring held with daily care if TF continues to be held/meet less than 80% of needs.     Learning Needs: None    Events of Current Admission:    has a past medical history of Acid reflux, Asthma, well controlled, Cancer of kidney, Chronic lower back pain, Congestive heart disease, COPD (chronic obstructive pulmonary disease), CVA (cerebral infarction) (01/12/2003), Diabetes, Diabetic nephropathy, Fibroids, Gout, H/O: gout, Hyperlipidemia, Hypertension, Neuropathy of hand, and SOB (shortness of breath). SLP eval 6/19 -pt unable to participate.     Medical Hx:  has a past medical history of  Acid reflux, Asthma, well controlled, Cancer of kidney, Chronic lower back pain, Congestive heart disease, COPD (chronic obstructive pulmonary disease), CVA (cerebral infarction) (01/12/2003), Diabetes, Diabetic nephropathy, Fibroids, Gout, H/O: gout, Hyperlipidemia, Hypertension, Neuropathy of hand, and SOB (shortness of breath).       Orders Placed This Encounter   Procedures    Diet NPO effective now    Tube feeding-Continuous     Intake:  Current enteral order:  Glucerna 1.5 @ 39ml/hr x 24hrs/day + x1 Prosource daily and 100 ml Q4H [facility]   Total EN regimen provides 1700 kcals, 109g protein, H2O + 600 ml from flushes  Meets 100% kcal/protein needs    ANTHROPOMETRIC  Height: 157.5 cm (5\' 2" )  Weight: 87.8 kg (193 lb 9 oz)     Weight Change: -0.79              Body mass index is 35.4 kg/m.     Weight History Summary   Weight Monitoring     Weight Weight Method   06/28/2022 87.09 kg  Standing Scale    06/29/2022 91.3 kg  Bed Scale    06/30/2022 96 kg  Bed Scale    07/01/2022 96.7 kg  Bed Scale    07/02/2022 97.6 kg  Bed Scale  07/03/2022 93 kg  Bed Scale    07/04/2022 87.8 kg  Bed Scale    07/05/2022 87.8 kg  Bed Scale    07/06/2022 93.6 kg  Bed Scale    07/07/2022 90.4 kg  Bed Scale     90.4 kg     07/08/2022 93.2 kg  Bed Scale    07/09/2022 93.1 kg  Bed Scale    07/10/2022 93.2 kg  Bed Scale     88.3 kg  Bed Scale    07/11/2022 88.5 kg  Bed Scale    07/12/2022 87.8 kg  Bed Scale      Fluctuating wts with dialysis.     ESTIMATED NEEDS    Total Daily Energy Needs: 1566 to 2001 kcal  Method for Calculating Energy Needs: 18 kcal - 23 kcal per kg  at 87 kg (Actual body weight)  Rationale: obese, noncritical, HD       Total Daily Protein Needs: 104.4 to 130.5 g  Method for Calculating Protein Needs: 2 g - 2.5 g per kg at 52.2 kg (Ideal body weight)  Rationale: obese, noncritical, HD      Total Daily Fluid Needs: 1044 to 1305 ml  Method for Calculating Fluid Needs: 20 ml - 25 ml  per kg at 52.2 kg (Ideal body  weight)  Rationale: bmi, age    Pertinent Medications:   acetaminophen, 650 mg, 4 times per day  albuterol-ipratropium, 3 mL, Q6H WA  glycopyrrolate, 0.2 mg, Q8H  insulin glargine, 7 Units, QAM  insulin lispro, 2-10 Units, Q4H SCH  meropenem, 500 mg, Q24H  metoprolol tartrate, 12.5 mg, Q12H SCH  micafungin, 100 mg, Q24H  pantoprazole, 40 mg, BID  sevelamer carbonate, 0.8 g, TID MEALS  thiamine, 100 mg, Q24H SCH       amiodarone 0.5 mg/min (07/12/22 0244)       IVF:     amiodarone 0.5 mg/min (07/12/22 0244)       Patient Lines/Drains/Airways Status       Active PICC Line / CVC Line / PIV Line / Drain / Airway / Intraosseous Line / Epidural Line / ART Line / Line / Wound / Pressure Ulcer / NG/OG Tube       Name Placement date Placement time Site Days    Temporary Catheter with Pigtail 06/30/22 Temporary Catheter Right 06/30/22  1700  --  11    Permacath Catheter - Tunneled 06/01/22 Internal Jugular Right 06/01/22  0956  -- 41    Midline IV 07/01/22 Anterior;Left Upper Arm 07/01/22  1315  Upper Arm  10    Peritoneal Dialysis Catheter Mid lower abdomen 05/26/22  2000  -- 46    Wound 06/28/22 Surgical Incision Abdomen Left GAUZE 4 X 4  10/PACK   06/28/22  0916  Abdomen  14    Wound 06/30/22 Surgical Incision Abdomen Other (Comment) WOUND VAC applied 06/30/22  1152  Abdomen  11    Feeding Tube ND 12 Fr. Right nare 07/05/22  1130  Right nare  6                    Pertinent labs:  Recent Labs   Lab 07/12/22  0418 07/11/22  0415 07/10/22  0136 07/09/22  1824 07/09/22  0981 07/09/22  0208 07/09/22  0207 07/08/22  0610   Sodium 138 139 141  --   --   --  141 140   Potassium 3.9 3.6 3.6  --   --   --  4.2 4.1   Chloride 101 103 103  --   --   --  106 107   CO2 21 21 23   --   --   --  20 19   BUN 77* 58* 41*  --   --   --  97* 99*   Creatinine 5.9* 4.9* 3.6*  --   --   --  6.0* 5.6*   Glucose 150* 156* 124*  --   --   --  181* 179*   Calcium 8.4 8.4 8.0  --   --   --  8.5 8.4   Magnesium 2.0 2.0 1.9  --   --   --  2.2 2.2    Phosphorus 7.2* 6.5* 4.9*  --   --   --  6.6* 6.2*   GFR 6.6* 8.2* 11.9*  --   --   --  6.4* 7.0*   WBC 17.81* 20.96* 23.01* 27.83*  --  30.24*  --  34.08*   Hematocrit 24.8* 27.3* 25.2* 27.3* 20.4* 20.8*  --  21.6*   Hemoglobin 8.5* 9.4* 8.4* 9.3* 6.7* 6.9*  --  7.1*       Physical Assessment: 07/12/22 similar findings/limited to to inability to participate; 07/05/22 noted mild muscle/fat wasting.   Head: temple region: slight depression with decrease in muscle tone/resistance (mild muscle loss - temporalis), orbital region: slightly dark circles, somewhat hollow look, some decrease in bounce back of fat pads (mild fat loss), and buccal region: full, round, filled-out cheeks, ample bounce back of fat pads (no wasting observed)  Upper Body: clavicle bone region: some protrusion of the clavicle with decrease in muscle tone/resistance (mild muscle loss - pectoralis major), shoulder and acromion bone region: slight protrusion of acromion process, decrease in muscle tone/resistance (moderate muscle loss - deltoid), and upper arm region: some fat in pinch between fingers, but not ample (mild fat loss)  Lower Body: anterior thigh region: well-rounded, well-developed, patella not prominent, feel muscle tone/resistance in quadriceps to patella (no wasting observed) and posterior calf region: some shape to the bulb, but not well-developed, decrease in muscle tone/resistance (mild muscle loss - gastrocnemius)  Edema: Non-pitting BUE and 1+ pitting BLE  Skin: Surgical incision on abdomen   GI function:Last BM Date: 07/12/22 (as per the report); +NDT                                                     NUTRITION DIAGNOSIS     Moderate Malnutrition related to inadequate nutritional intake in the setting of acute illness as evidenced by <50% EER > 5 days, mild muscle losses (temporalis, deltoid) - active                                                            INTERVENTION     Nutrition recommendation - Please refer to top of  note  MONITORING/EVALUATION     Goals:     2. Patient will meet >80% of nutritional needs via tube feeding by next RD follow up  - active; not met on average      Nutrition Risk Level: High (will follow up at least 2 times per week and PRN)      Ronalee Red, RD, CNSC  Clinical Dietitian PRN

## 2022-07-12 NOTE — Progress Notes (Signed)
LOS # 14       Summary of Discharge Plan: Pending clinical course. SNF vs LTAC. MMT transport needed.         Identified Possible Discharge Barriers:Patient is hypoactive, delirious,  and unable to follow commands. DCP pending improved mental status and consult sign off.      CM Interventions and Outcome: Chart review         Discussed above Discharge Plan with (patient, family, Care Team, others): Care Team and attending.        Case Management will continue to follow for [place] needs that may arise.     Adine Madura RN, BSN  Case Manager I  [phone]  [email or web address]

## 2022-07-12 NOTE — Progress Notes (Cosign Needed)
4 eyes in 4 hours pressure injury assessment note:      Completed with:   Unit & Time admitted:              Bony Prominences: Check appropriate box; if wound is present enter wound assessment in LDA     Occiput:                 [x] WNL  []  Wound present  Face:                     [x] WNL  []  Wound present  Ears:                      [x] WNL  []  Wound present  Spine:                    [x] WNL  []  Wound present  Shoulders:             [x] WNL  []  Wound present  Elbows:                  [x] WNL  []  Wound present  Sacrum/coccyx:     [x] WNL  []  Wound present  Ischial Tuberosity:  [x] WNL  []  Wound present  Trochanter/Hip:      [x] WNL  []  Wound present  Knees:                   [x] WNL  []  Wound present  Ankles:                   [x] WNL  []  Wound present  Heels:                    [x] WNL  []  Wound present  Other pressure areas:  []  Wound location       Device related: []  Device name: abdomen, surgical incision w/ wound vac.         LDA completed if wound present: yes/no  Consult WOCN if necessary    Other skin related issues, ie tears, rash, etc, document in Integumentary flowsheet

## 2022-07-12 NOTE — Plan of Care (Signed)
Problem: Renal Instability  Goal: Fluid and electrolyte balance are achieved/maintained  Outcome: Progressing  Flowsheets (Taken 07/12/2022 0949)  Fluid and electrolyte balance are achieved/maintained:   Monitor/assess lab values and report abnormal values   Assess and reassess fluid and electrolyte status   Observe for cardiac arrhythmias   Follow fluid restrictions/IV/PO parameters   Monitor for muscle weakness     Problem: Patient Receiving Advanced Renal Therapies  Goal: Therapy access site remains intact  Outcome: Progressing  Flowsheets (Taken 07/12/2022 0949)  Therapy access site remains intact:   Assess therapy access site   Change therapy access site dressing as needed

## 2022-07-12 NOTE — Progress Notes (Signed)
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 s/p bilateral radical nephrectomies and  cystectomy on 06/28/22  - complicated by small bowel enterotomy and blood loss   New onset afib - on amio gtt + metoprolol  High grade urothelial bladder CA: s/p bilateral nephrectomies and cystectomy c/b mall bowel enterotomy and blood loss (~1L) on 06/28/22  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 (approx 1L) - remains low  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  Encephalopathy - persists  Leukocytosis - improving - on meropenem and micafungin    Recommendations:   HD today and tomorrow  HD tomorrow  Permcath when okay with ID and closer to d/c  Goals of care discussion  Start renvela packet q8h   Change tube feeds to Nepro  Wean O2      Case discussed with: pt, daughter    Debara Pickett, MD  [facility]  [phone] (office)  X [phone] (FFX Spectra-Link)      Subjective: awake on 3L NC  S/p HD today 1.8L UF      Review of Systems:   Unable to assess, patient is confused     Physical Exam:     Vitals:    07/12/22 0244 07/12/22 0415 07/12/22 0730 07/12/22 0819   BP: 142/67 120/58 132/85 132/85   Pulse:  86 87 91   Resp:  22 (!) 47    Temp:  98.9 F (37.2 C) 98.6 F (37 C)    TempSrc:  Axillary Axillary    SpO2:  98% 96%    Weight:  87.8 kg (193 lb 9 oz)     Height:           Intake and Output Summary (Last 24 hours) at Date Time    Intake/Output Summary (Last 24 hours) at 07/12/2022 0981  Last data filed at 07/11/2022 1800  Gross per 24 hour   Intake  280 ml   Output --   Net 280 ml       General: confused, on NC  Cardiovascular: regular rate and rhythm  Lungs: bilateral air entry  Abdomen: soft  Extremities: edema noted in her hands and thighs  Access: R quinton, old RIJ permacath    Meds:      Scheduled Meds: PRN Meds:    acetaminophen, 650 mg, Oral, 4 times per day  albuterol-ipratropium, 3 mL, Nebulization, Q6H WA  glycopyrrolate, 0.2 mg, Intravenous, Q8H  insulin glargine, 7 Units, Subcutaneous, QAM  insulin lispro, 2-10 Units, Subcutaneous, Q4H SCH  meropenem, 500 mg, Intravenous, Q24H  metoprolol tartrate, 12.5 mg, per NG tube, Q12H SCH  micafungin, 100 mg, Intravenous, Q24H  pantoprazole, 40 mg, Intravenous, BID  sevelamer carbonate, 0.8 g, Oral, TID MEALS  thiamine, 100 mg, Intravenous, Q24H SCH          Continuous Infusions:   amiodarone 0.5 mg/min (07/12/22 0244)      albumin human, 100 mL, PRN  albuterol-ipratropium, 3  mL, Q6H PRN  benzocaine, 1 spray, TID PRN  dextrose, 15 g of glucose, PRN   Or  dextrose, 12.5 g, PRN   Or  dextrose, 12.5 g, PRN   Or  glucagon (rDNA), 1 mg, PRN  diphenhydrAMINE, 25 mg, Q6H PRN  labetalol, 10 mg, Q2H PRN  lidocaine 2% jelly, , Once PRN  naloxone, 0.2 mg, PRN  oxyCODONE, 5 mg, Q4H PRN  phenol, 1 spray, Q2H PRN  sodium chloride, 100 mL, Q1H PRN  sodium chloride, 250 mL, PRN              Labs:     Recent Labs   Lab 07/12/22  0418 07/11/22  0415 07/10/22  0136   WBC 17.81* 20.96* 23.01*   Hemoglobin 8.5* 9.4* 8.4*   Hematocrit 24.8* 27.3* 25.2*   Platelet Count 282 267 239     Recent Labs   Lab 07/12/22  0418 07/11/22  0415 07/10/22  0136 07/08/22  0610 07/07/22  2120 07/06/22  1545 07/06/22  0341   Sodium 138 139 141  More results in Results Review 134*  More results in Results Review 139   Potassium 3.9 3.6 3.6  More results in Results Review 4.0  More results in Results Review 4.5   Chloride 101 103 103  More results in Results Review 103  More results in Results Review 106   CO2 21 21 23   More results in Results  Review 18  More results in Results Review 21   BUN 77* 58* 41*  More results in Results Review 94*  More results in Results Review 79*   Creatinine 5.9* 4.9* 3.6*  More results in Results Review 5.3*  More results in Results Review 5.3*   Calcium 8.4 8.4 8.0  More results in Results Review 8.2  More results in Results Review 7.7*   Albumin  --   --   --   --  1.4*  --  1.3*   Phosphorus 7.2* 6.5* 4.9*  More results in Results Review 6.5*  --  4.2   Magnesium 2.0 2.0 1.9  More results in Results Review 2.3  More results in Results Review 2.3   Glucose 150* 156* 124*  More results in Results Review 277*  More results in Results Review 210*   GFR 6.6* 8.2* 11.9*  More results in Results Review 7.5*  More results in Results Review 7.5*   More results in Results Review = values in this interval not displayed.           Signed by: Debara Pickett, MD, MD

## 2022-07-12 NOTE — Progress Notes (Signed)
[facility] PROGRESS NOTE      Date of Note: 07/12/2022   Patient Name: Shelby Barron     Date of Birth:  1940/11/18     MRN:               [id number]     PCP:               Patsy Lager, MD     I am available on epic chart and Spectra link extension [phone] Monday - Friday 8:30-16:30. If urology consultation is needed outside these hours please contact [facility] at [phone].      ASSESSMENT/  PLAN     -medical management per cardiology, GI, ID, nephrology, medicine. No additional urology recs.     I will discuss/discussed the plan of care with the following services:  family at bedside   Dr. Astrid Drafts, Urology     I spent a total of 10 minutes involved in this patient's care    Sheldon Silvan, PA  07/12/2022, 10:58 AM    SUBJECTIVE     Interval History: 6/10 radical cystectomy and bilateral nephrectomy with enterotomy  6/12 abdominal wound closure, bowel anastamosis. 6/18 CT no indicative of abscess or leak.     WBC trending down.     Patient Active Problem List    Diagnosis Date Noted    Anemia 06/30/2022    COPD (chronic obstructive pulmonary disease) 06/30/2022    Bladder cancer 06/28/2022    Malignant neoplasm of urinary bladder, unspecified site 06/25/2022    Malignant tumor of kidney, left 06/25/2022    GERD (gastroesophageal reflux disease) 06/25/2022    Leukocytosis 06/25/2022    Chronic diastolic congestive heart failure 06/23/2022    Aortic atherosclerosis 06/23/2022    Peritonitis 05/26/2022    ESRD (end stage renal disease) 05/26/2022    Hamstring injury 05/10/2013    Benign essential hypertension 02/10/2013    Left renal mass 11/28/2012    Type 2 diabetes mellitus with kidney complication, with long-term current use of insulin 07/31/2010    Diabetes mellitus type II, uncontrolled 07/31/2010    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        OBJECTIVE     Vital Signs: BP 120/65   Pulse 92   Temp 99 F (37.2 C)   Resp (!) 28   Ht 1.575 m (5\' 2" )   Wt 87.8 kg (193 lb 9 oz)   SpO2 99%   BMI 35.40  kg/m     TMax: Temp (24hrs), Avg:98.9 F (37.2 C), Min:98.6 F (37 C), Max:99.1 F (37.3 C)    I/Os:   Intake/Output Summary (Last 24 hours) at 07/12/2022 1058  Last data filed at 07/12/2022 0900  Gross per 24 hour   Intake 510 ml   Output 0 ml   Net 510 ml             LABS & IMAGING     CBC  Recent Labs     07/12/22  0418 07/11/22  0415 07/10/22  0136   WBC 17.81* 20.96* 23.01*   RBC 2.72* 3.03* 2.79*   Hematocrit 24.8* 27.3* 25.2*   MCV 91.2 90.1 90.3   MCH 31.3 31.0 30.1   MCHC 34.3 34.4 33.3   RDW 17* 16* 16*   MPV 11.3 11.1 11.0       BMP  Recent Labs     07/12/22  0418 07/11/22  0415 07/10/22  0136   CO2 21 21 23  Anion Gap 16.0* 15.0 15.0   BUN 77* 58* 41*   Creatinine 5.9* 4.9* 3.6*       INR  Lab Results   Component Value Date/Time    INR 1.1 07/01/2022 11:20 AM    INR 1.1 06/01/2022 03:15 AM         IMAGING:  XR Chest AP Portable   Final Result      Interstitial markings have slightly increased, suggesting minimal edema. No   other change.      Wilmon Pali, MD   07/10/2022 4:49 PM      XR Chest AP Portable   Final Result    There is mild interstitial prominence suggesting mild   congestive heart failure/fluid overload in the setting of shortness of   breath      [name], MD   07/07/2022 12:32 PM      XR Chest AP Portable   Final Result       Hypoventilation with basilar atelectasis.      Charlott Rakes, MD   07/06/2022 3:35 PM      CT Abdomen Pelvis W IV And PO Cont   Final Result      1.Postoperative changes from bilateral nephrectomy and cystectomy.    2.Small amount of ascites including some areas which appear loculated.   Small collections of fluid and gas in the anterior midline abdomen may be   postoperative but superimposed infection is not excluded. No rim-enhancing   fluid collections or extravasation of oral contrast to suggest anastomotic   leak.      Demetrios Isaacs, MD   07/06/2022 5:51 AM      CT Head WO Contrast   Final Result       1.No acute intracranial abnormality is seen.      Leandro Reasoner, MD   07/06/2022 3:56 AM      XR Abdomen AP   Final Result      Enteric tube terminates in the distal stomach.      Elease Etienne, DO   07/05/2022 12:24 PM      XR Chest AP Portable   Final Result         1. Lines and tubes in adequate position. No pneumothorax.   2. Trace left pleural effusion and bibasilar atelectasis.      Jasmine December D'Heureux, MD   06/30/2022 4:53 PM      XR Chest AP Portable   Final Result      Catheters as described.      Pankaj Dominica, MD   06/30/2022 2:07 PM      XR Chest AP Portable   Final Result    Right mainstem bronchial intubation. The tube should be   withdrawn about 4 cm.      Dr. Tildon Husky is aware.      Wynema Birch, MD   06/28/2022 4:56 PM      XR OR MISCOUNT   Final Result    No unexpected foreign body.      These critical results were discussed with and acknowledged by Angie R.,   the circulating nurse, who will communicate the findings to Everardo All,   MD on 06/28/2022 3:02 PM.         Bosie Helper, MD   06/28/2022 3:03 PM

## 2022-07-12 NOTE — Progress Notes (Signed)
Office: [phone]  Epic GroupChat: "[facility])"    Date Time: 07/12/22 @NOW   Patient Name: Shelby Barron, Shelby Barron      Problem List:      Admitted 6/10 for scheduled cystectomy and completion nephrectomy     RCC s/p b/l nephrectomy, Newly diagnosed urothelial CA  - Prior RCC s/p partial b/l nephrectomy  - ESRD on PD previously -> HD 05/2022  - 05/2022 newly diagnosed urothelial Ca  - Admitted 6/10 for planned cystectomy, completion nephrectomy     Small bowel enterotomy  - Complication of her 6/10 procedure, repaired by gen surg  - Left in discontinuity given significant bleeding  - 6/12 RTOR for re-anastomosis, closure. Wound vac in place.      Leukocytosis, abdominal fluid collections, ?PNA  - Worsening WBCs since 6/15  - CT- 6/18 - post-op changes, small ascites, small collection of fluid/gas anterior midline abdomen, may be post-op, but superimposed infection cannot be ruled out  - Sputum Cx GNRs     ID History:   - Above     Chronic Conditions:  - COPD  - CVA 2004  - T2DM  - RCC s/p partial b/l nephrectomy  - [name]  - Urothelial cancer     Allergies:  - Hives with sulfa    Interval Events/Subjective:     NAEO   Afebrile  WBCs slowly doentrending    Antimicrobials:   #14 abx     #7 mero  #5 mica     Prior  #5 cefepime  #5 flagyl  #4 zosyn    Estimated Creatinine Clearance: 7.4 mL/min (A) (based on SCr of 5.9 mg/dL (H)).      Assessment:   Shelby Barron is a 82 y.o. female with PMH RCC s/p partial b/l nephrectomy and more recent urothelial CA (05/2022) admitted for planned cystectomy/completion nephrectomy, c/b enterotomy now s/p repair and closure. We are consulted for rising leukocytosis.     Source is either pulmonary in setting of GNRs in her sputum (low quantity) vs abdominal, given her extensive surgery and abdominal collections on imaging, however these are small anterior midline collections without enhancement so may be post-op uninfected collections.      WBCs began  to trend down after initiation of mero. So will continue this for now and monitor her blood cultures. Sputum was low growth, no further work-up.     Plan:     - Continue meropenem  - Consider stopping micafungin  - Follow-up bcx  - Consider repeating abdominal imaging ~6/26, about 10 days since last scan    Monitoring clinical response and untoward effects of high risk/IV antimicrobials        Lines:     Patient Lines/Drains/Airways Status       Active PICC Line / CVC Line / PIV Line / Drain / Airway / Intraosseous Line / Epidural Line / ART Line / Line / Wound / Pressure Ulcer / NG/OG Tube       Name Placement date Placement time Site Days    Temporary Catheter with Pigtail 06/30/22 Temporary Catheter Right 06/30/22  1700  --  9    Permacath Catheter - Tunneled 06/01/22 Internal Jugular Right 06/01/22  0956  -- 39    Peripheral IV 07/01/22 22 G Standard Anterior;Left Forearm 07/01/22  1300  Forearm  8    Midline IV 07/01/22 Anterior;Left Upper Arm 07/01/22  1315  Upper Arm  8    Rectal Tube Without  balloon 07/07/22  1400  --  2    Peritoneal Dialysis Catheter Mid lower abdomen 05/26/22  2000  -- 44    Wound 06/28/22 Surgical Incision Abdomen Left GAUZE 4 X 4  10/PACK   06/28/22  0916  Abdomen  12    Wound 06/30/22 Surgical Incision Abdomen Other (Comment) WOUND VAC applied 06/30/22  1152  Abdomen  10    Feeding Tube ND 12 Fr. Right nare 07/05/22  1130  Right nare  5                    *I have performed a risk-benefit analysis and the patient needs a central line for access and IV medications      Review of Systems:   General ROS: negative for - chills, fevers, night sweats, weight loss   HEENT: negative for - blurry vision, sore throat, thrush   Respiratory ROS: negative for cough, SOB  Cardiovascular ROS: negative for - chest pain, palpitations   Gastrointestinal ROS: negative for - abdominal pain, nausea, vomiting, diarrhea  Genito-Urinary ROS: negative for - dysuria, urinary frequency/urgency   Musculoskeletal  ROS: negative for - joint pain, joint stiffness or muscle pain   Dermatological ROS: negative for - rash and skin lesion changes   Neurological ROS: negative for - confusion, headache, dizziness  Hematological ROS: negative for - bruising, bleeding   Psychological ROS: negative for - changes in mood    Physical Exam:     Vitals:    07/12/22 2125   BP: 123/55   Pulse: 94   Resp:    Temp:    SpO2:          General Appearance: elderly, ill appearing  Neuro: unresponsive  HEENT: Nasal canula. HD cath  Lungs: diminished breath sounds anteriorly  Cardiac: tachycardic, regular  Abdomen: wound vac in place, midline abdominal incision  Extremities: no pedal edema  Skin: no rash      Family History:     Family History   Problem Relation Age of Onset    Cancer Mother     Heart failure Father     Cancer Maternal Aunt     Diabetes Maternal Aunt     Cancer Paternal Aunt        Social History:     Social History     Socioeconomic History    Marital status: Married     Spouse name: Not on file    Number of children: Not on file    Years of education: Not on file    Highest education level: Not on file   Occupational History    Not on file   Tobacco Use    Smoking status: Former    Smokeless tobacco: Never   Vaping Use    Vaping status: Never Used   Substance and Sexual Activity    Alcohol use: No    Drug use: No    Sexual activity: Not on file   Other Topics Concern    Not on file   Social History Narrative    Not on file     Social Determinants of Health     Financial Resource Strain: Not on file   Food Insecurity: No Food Insecurity (05/26/2022)    Hunger Vital Sign     Worried About Running Out of Food in the Last Year: Never true     Ran Out of Food in the Last Year: Never true   Transportation Needs: No  Transportation Needs (05/26/2022)    PRAPARE - Therapist, art (Medical): No     Lack of Transportation (Non-Medical): No   Physical Activity: Not on file   Stress: Not on file   Social Connections:  Unknown (02/01/2022)    Received from [facility]    Social Network     Social Network: Not on file   Intimate Partner Violence: Not At Risk (05/26/2022)    Humiliation, Afraid, Rape, and Kick questionnaire     Fear of Current or Ex-Partner: No     Emotionally Abused: No     Physically Abused: No     Sexually Abused: No   Housing Stability: Unknown (05/26/2022)    Housing Stability Vital Sign     Unable to Pay for Housing in the Last Year: No     Number of Places Lived in the Last Year: Not on file     Unstable Housing in the Last Year: No       Allergies:     Allergies   Allergen Reactions    Metrizamide Shortness Of Breath and Nausea Only    Rosuvastatin Other (See Comments)     myopathy    myopathy   myopathy    myopathy myopathy      myopathy    myopathy, myopathy    Iodinated Contrast Media Other (See Comments)     Nausea and SOB per patient   On Dialysisi    Nausea and SOB per patient    Nausea and SOB per patient      Nausea and SOB per patient   Nausea and SOB per patient    Nausea and SOB per patient      Nausea and SOB per patient Nausea and SOB per patient      Nausea and SOB per patient Nausea and SOB per patient Nausea and SOB per patient      Nausea and SOB per patient  Nausea and SOB per patient Nausea and SOB per patient    Nausea and SOB per patient   Nausea and SOB per patient    Pioglitazone      Weight gain    Sulfa Antibiotics Hives     blister       Labs:     Lab Results   Component Value Date    WBC 17.81 (H) 07/12/2022    HGB 8.5 (L) 07/12/2022    HCT 24.8 (L) 07/12/2022    MCV 91.2 07/12/2022    PLT 282 07/12/2022     Lab Results   Component Value Date    CREAT 5.9 (H) 07/12/2022     Lab Results   Component Value Date    ALT 15 07/07/2022    AST 23 07/07/2022    ALKPHOS 69 07/07/2022    BILITOTAL 0.1 (L) 07/07/2022     Lab Results   Component Value Date    LACTATE 1.1 07/09/2022    LACTATE 2.0 05/26/2022       Microbiology:     Microbiology Results (last 15 days)       Procedure Component Value  Units Date/Time    Culture, Blood, Aerobic And Anaerobic [[id number]] Collected: 07/07/22 0355    Order Status: Completed Specimen: Blood, Venous Updated: 07/12/22 0700     Culture Blood No growth at 5 days    Culture, Blood, Aerobic And Anaerobic [[id number]] Collected: 07/06/22 1837    Order Status: Completed Specimen: Blood, Venous Updated:  07/12/22 0000     Culture Blood No growth at 5 days    Culture, Sputum and Lower Respiratory [[id number]]     Order Status: Sent Specimen: Sputum, Induced     Culture, Sputum and Lower Respiratory [[id number]]  (Abnormal) Collected: 07/04/22 1034    Order Status: Completed Specimen: Sputum, Suctioned Updated: 07/12/22 1756     Culture Respiratory Heavy growth of Mixed upper respiratory flora      Light growth of Gram negative rod     Comment: Questionable significance due to low quantity.  No further work.        Gram Stain Few WBCs      Rare Squamous epithelial cells      Few Mixed respiratory flora    Rectal Swab Carbapenemase Surveillance, PCR [[id number]]  (Normal) Collected: 06/29/22 1842    Order Status: Completed Specimen: Swab from Rectum Updated: 06/30/22 0150     IMP gene sequence Not Detected     VIM gene sequence Not Detected     NDM gene sequence Not Detected     KPC gene sequence Not Detected     OXA-48 gene sequence Not Detected    Narrative:      This test utilizes real-time PCR for the detection and differentiation of gene sequences associated with carbapenem-non-susceptibility in Enterobacterales (Enterobacteriaceae), Pseudomonas aeruginosa and Acinetobacter baumannii. Targets include blaKPC, blaNDM, blaVIM, blaOXA-48 (and blaOXA-181), and blaIMP (excluding IMP-7, IMP-13, IMP-14). Detection of these gene sequences does not indicate the presence of viable organisms. This assay does not report variants of these gene sequences. Mutations or polymorphisms may affect detection of current, new, or unknown variants, resulting in a false negative result. This test is for  infection prevention surveillance purposes only and should not be used to make treatment decisions. The performance of the Xpert Carba-R Assay has not been evaluated with rectal swab specimens from pediatric patients.            Rads:   No results found.    Thank you for this interesting consult. During the week, please reach out to author of the most recent ID note.     Signed by: Armen Pickup, MD

## 2022-07-13 ENCOUNTER — Inpatient Hospital Stay: Payer: No Typology Code available for payment source

## 2022-07-13 DIAGNOSIS — I639 Cerebral infarction, unspecified: Secondary | ICD-10-CM

## 2022-07-13 DIAGNOSIS — R29898 Other symptoms and signs involving the musculoskeletal system: Secondary | ICD-10-CM

## 2022-07-13 LAB — BASIC METABOLIC PANEL
Anion Gap: 15 (ref 5.0–15.0)
BUN: 45 mg/dL — ABNORMAL HIGH (ref 7–21)
CO2: 23 mEq/L (ref 17–29)
Calcium: 8 mg/dL (ref 7.9–10.2)
Chloride: 100 mEq/L (ref 99–111)
Creatinine: 4.1 mg/dL — ABNORMAL HIGH (ref 0.4–1.0)
GFR: 10.2 mL/min/{1.73_m2} — ABNORMAL LOW (ref >=60.0)
Glucose: 182 mg/dL — ABNORMAL HIGH (ref 70–100)
Potassium: 3.5 mEq/L (ref 3.5–5.3)
Sodium: 138 mEq/L (ref 135–145)

## 2022-07-13 LAB — CBC
Absolute nRBC: 0 10*3/uL (ref <=0.00)
Hematocrit: 24.1 % — ABNORMAL LOW (ref 34.7–43.7)
Hemoglobin: 8 g/dL — ABNORMAL LOW (ref 11.4–14.8)
MCH: 30.3 pg (ref 25.1–33.5)
MCHC: 33.2 g/dL (ref 31.5–35.8)
MCV: 91.3 fL (ref 78.0–96.0)
MPV: 11.3 fL (ref 8.9–12.5)
Platelet Count: 249 10*3/uL (ref 142–346)
RBC: 2.64 10*6/uL — ABNORMAL LOW (ref 3.90–5.10)
RDW: 16 % — ABNORMAL HIGH (ref 11–15)
WBC: 15.12 10*3/uL — ABNORMAL HIGH (ref 3.10–9.50)
nRBC %: 0 /100 WBC (ref <=0.0)

## 2022-07-13 LAB — WHOLE BLOOD GLUCOSE POCT
Whole Blood Glucose POCT: 173 mg/dL — ABNORMAL HIGH (ref 70–100)
Whole Blood Glucose POCT: 169 mg/dL — ABNORMAL HIGH (ref 70–100)
Whole Blood Glucose POCT: 163 mg/dL — ABNORMAL HIGH (ref 70–100)
Whole Blood Glucose POCT: 136 mg/dL — ABNORMAL HIGH (ref 70–100)

## 2022-07-13 LAB — PHOSPHORUS: Phosphorus: 4.1 mg/dL (ref 2.3–4.7)

## 2022-07-13 LAB — MAGNESIUM: Magnesium: 1.8 mg/dL (ref 1.6–2.6)

## 2022-07-13 MED ORDER — HYDRALAZINE HCL 20 MG/ML IJ SOLN
10.0000 mg | INTRAMUSCULAR | Status: DC | PRN
Start: 2022-07-13 — End: 2022-08-18
  Administered 2022-07-14 – 2022-07-26 (×5): 10 mg via INTRAVENOUS
  Filled 2022-07-13 (×7): qty 1

## 2022-07-13 MED ORDER — LOPERAMIDE HCL 2 MG PO CAPS
2.0000 mg | ORAL_CAPSULE | Freq: Once | ORAL | Status: AC
Start: 2022-07-13 — End: 2022-07-13
  Administered 2022-07-13: 2 mg via ORAL
  Filled 2022-07-13: qty 1

## 2022-07-13 MED ORDER — ASPIRIN 81 MG PO CHEW
81.0000 mg | CHEWABLE_TABLET | Freq: Every day | ORAL | Status: DC
Start: 2022-07-13 — End: 2022-07-23
  Administered 2022-07-13 – 2022-07-23 (×11): 81 mg via NASOGASTRIC
  Filled 2022-07-13 (×11): qty 1

## 2022-07-13 MED ORDER — SODIUM CHLORIDE 0.9 % IV BOLUS
100.0000 mL | INTRAVENOUS | Status: AC | PRN
Start: 2022-07-13 — End: 2022-07-13

## 2022-07-13 MED ORDER — ALBUMIN HUMAN 25 % IV SOLN
100.0000 mL | INTRAVENOUS | Status: AC | PRN
Start: 2022-07-13 — End: 2022-07-13

## 2022-07-13 MED ORDER — SODIUM CHLORIDE 0.9 % IV BOLUS
250.0000 mL | INTRAVENOUS | Status: AC | PRN
Start: 2022-07-13 — End: 2022-07-13

## 2022-07-13 MED ORDER — LABETALOL HCL 5 MG/ML IV SOLN (WRAP)
10.0000 mg | INTRAVENOUS | Status: DC | PRN
Start: 2022-07-13 — End: 2022-08-18
  Administered 2022-07-14 – 2022-07-23 (×11): 10 mg via INTRAVENOUS
  Filled 2022-07-13 (×8): qty 4

## 2022-07-13 MED ORDER — ALBUMIN HUMAN 25 % IV SOLN
INTRAVENOUS | Status: AC
Start: 2022-07-13 — End: 2022-07-13
  Administered 2022-07-13: 25 g via INTRAVENOUS
  Filled 2022-07-13: qty 100

## 2022-07-13 NOTE — Progress Notes (Signed)
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 s/p bilateral radical nephrectomies and  cystectomy on 06/28/22  - complicated by small bowel enterotomy and blood loss   New onset afib - on amio gtt + metoprolol  High grade urothelial bladder CA: s/p bilateral nephrectomies and cystectomy c/b mall bowel enterotomy and blood loss (~1L) on 06/28/22  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 (approx 1L) - remains low  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  Encephalopathy - persists  Leukocytosis - improving - on meropenem and micafungin    Recommendations:   S/p HD yesterday with 1.8L net UF  Hd again today to continue TTS schedule  Would plan for permacath placement tomorrow - discussed with CVIR  Hold tube feeds at midnight  Goals of care discussion  Continue renvela packet q8h       Case discussed with: pt, primary team, CVIR     Delice Bison, MD  [facility]  [phone] (office)  X [phone] (FFX Spectra-Link)      Subjective: s/p HD yesterday with 1.8L  Seen at the initiation of dialysis, altered       Review of Systems:   Unable to assess, patient is confused     Physical Exam:     Vitals:    07/13/22 0921 07/13/22 1000 07/13/22 1120 07/13/22 1500   BP:  142/67 142/67 151/67   Pulse: 86 86 88 88   Resp: 22 (!) 36 20 15   Temp:   98.6 F (37 C) 98.5 F (36.9 C)   TempSrc:   Axillary    SpO2: 98% 98% 98% 98%   Weight:       Height:           Intake and Output Summary (Last 24 hours) at Date Time    Intake/Output Summary (Last 24  hours) at 07/13/2022 1514  Last data filed at 07/13/2022 1130  Gross per 24 hour   Intake 1708.7 ml   Output --   Net 1708.7 ml       General: confused, on NC  Cardiovascular: regular rate and rhythm  Lungs: bilateral air entry  Abdomen: soft  Extremities: edema noted in her hands and thighs  Access: R quinton, old RIJ permacath    Meds:      Scheduled Meds: PRN Meds:    acetaminophen, 650 mg, Oral, 4 times per day  albuterol-ipratropium, 3 mL, Nebulization, Q6H WA  aspirin, 81 mg, per NG tube, Daily  glycopyrrolate, 0.2 mg, Intravenous, Q8H  insulin glargine, 7 Units, Subcutaneous, QAM  insulin lispro, 2-10 Units, Subcutaneous, Q4H SCH  meropenem, 500 mg, Intravenous, Q24H  metoprolol tartrate, 12.5 mg, per NG tube, Q12H SCH  micafungin, 100 mg, Intravenous, Q24H  pantoprazole, 40 mg, Intravenous, BID  sevelamer carbonate, 0.8 g, Oral, TID MEALS  thiamine, 100 mg, Intravenous, Q24H SCH          Continuous Infusions:   amiodarone  0.5 mg/min (07/13/22 1130)      albumin human, 100 mL, PRN  albuterol-ipratropium, 3 mL, Q6H PRN  benzocaine, 1 spray, TID PRN  dextrose, 15 g of glucose, PRN   Or  dextrose, 12.5 g, PRN   Or  dextrose, 12.5 g, PRN   Or  glucagon (rDNA), 1 mg, PRN  diphenhydrAMINE, 25 mg, Q6H PRN  HYDROmorphone, 1 mg, Q3H PRN  labetalol, 10 mg, Q2H PRN  lidocaine 2% jelly, , Once PRN  naloxone, 0.2 mg, PRN  oxyCODONE, 5 mg, Q4H PRN  phenol, 1 spray, Q2H PRN  sodium chloride, 100 mL, Q1H PRN  sodium chloride, 250 mL, PRN              Labs:     Recent Labs   Lab 07/13/22  0348 07/12/22  0418 07/11/22  0415   WBC 15.12* 17.81* 20.96*   Hemoglobin 8.0* 8.5* 9.4*   Hematocrit 24.1* 24.8* 27.3*   Platelet Count 249 282 267     Recent Labs   Lab 07/13/22  0348 07/12/22  0418 07/11/22  0415 07/08/22  0610 07/07/22  2120   Sodium 138 138 139  More results in Results Review 134*   Potassium 3.5 3.9 3.6  More results in Results Review 4.0   Chloride 100 101 103  More results in Results Review 103   CO2 23 21 21   More  results in Results Review 18   BUN 45* 77* 58*  More results in Results Review 94*   Creatinine 4.1* 5.9* 4.9*  More results in Results Review 5.3*   Calcium 8.0 8.4 8.4  More results in Results Review 8.2   Albumin  --   --   --   --  1.4*   Phosphorus 4.1 7.2* 6.5*  More results in Results Review 6.5*   Magnesium 1.8 2.0 2.0  More results in Results Review 2.3   Glucose 182* 150* 156*  More results in Results Review 277*   GFR 10.2* 6.6* 8.2*  More results in Results Review 7.5*   More results in Results Review = values in this interval not displayed.           Signed by: Delice Bison, MD, MD

## 2022-07-13 NOTE — Consults (Signed)
Consult received from Dr. Lendell Caprice for removal of non-tunneled and tunneled right IJ dialysis catheters and placement of new tunneled HD catheter.     Patient is an 82 y/o female with PMH of COPD, CVA, DM 2, RCC, s/p bilateral nephrectomy, [name], newly diagnosed urothelial cancer.  Patient currently admitted with lung hospital course s/p cystectomy and completion nephrectomy complicated by small bowel enterotomy as well as mixed shock.  She is s/p right IJ tunneled HD catheter placement with CVIR on 06/01/2022.  Per Nephrology, tunneled HD catheter clotted off necessitating placement of a nontunneled right IJ catheter by the ICU team on 06/30/2022.  SS, afebrile, low-grade fever yesterday evening (T 100.6).  Currently labs show leukocytosis WBC 15.12, on IV antibiotics, followed by ID for possible postop infection. ID documents no specific contraindication to tunneled HD catheter placement.      Allergies to metrizamide, rosuvastatin, iodinated contrast media, pioglitazone, sulfa antibiotics  Antiplatelet use-ASA (on hold) anticoagulant use- none  INR-none, creatinine 4.1, plts 249, hgb 8.0    Case and imaging reviewed. Will tentatively plan for removal of right IJ tunneled and non-tunneled lines with plan to place new tunneled HD access in CVIR tomorrow.  NPO after midnight, hold tube feed.  AM coag labs ordered.  Will obtain consent from family.  Continue medical management per primary team and consultants.    Discussed with Dr. Lendell Caprice, Dr. Don Broach, bedside RN    A total of 15 minutes were spent reviewing pertinent imaging studies and in medical consultative discussion with medical team.      Marcha Dutton, NP  CVIR Department  [facility]  [phone]        Signed by: Barnie Alderman, DO    [facility]  [facility] - [facility], Division of Vascular & Interventional Radiology  [facility]- [phone]  [facility] - [phone]  [facility]--[id number] [facility]--8022 [facility]--3069  After Hours Service [phone]

## 2022-07-13 NOTE — Progress Notes (Signed)
Hd tx completed x3.5hrs, 2L fluid removed, albumin 25% given during tx for hypotension, pt is responsive when named called, awake, VS wdl, Afib on tele, HR WDL, changed catheter dressing, Report given to Hyerim RN.   07/13/22 1840   Treatment Summary   Time Off Machine 1840   Duration of Treatment (Hours) 3.5   Treatment Type 2:1   Dialyzer Clearance Moderately streaked   Fluid Volume Off (mL) 2500   Prime Volume (mL) 200   Rinseback Volume (mL) 200   Fluid Given: Normal Saline (mL) 0   Fluid Given: PRBC  0 mL   Fluid Given: Albumin (mL) 100   Fluid Given: Other (mL) 0   Total Fluid Given 500   Hemodialysis Net Fluid Removed 2000   Post Treatment Assessment   Post-Treatment Weight (Kg) -2.1   Patient Response to Treatment tolerated fair   Additional Dialyzer Used 0   Temporary Catheter with Pigtail 06/30/22 Temporary Catheter Right   Placement Date/Time: 06/30/22 1700   Access Type: Temporary Catheter  Orientation: Right  Central Line Infection Prevention Education provided?: Yes  Hand Hygiene: Chlorhexidine;Alcohol based hand scrub  Line cart used?: Yes  Line kit used?: Yes  Maxi...   Line necessity reviewed? Apheresis/hemodialysis   Site Assessment Clean;Dry;Intact   Catheter Lumen Volume Venous 1.2 mL   Catheter Lumen Volume Arterial 1.2 mL   Distal Lumen Status Capped   Tego/Curos Caps on Catheter Yes   NEW Tego/Curos Caps placed (Date) 07/13/22   APHERESIS ONLY:  Access  Red   APHERESIS ONLY: Return Blue   End Caps Free From Blood Yes   Line Care Connections checked and tightened   Dressing Type Transparent;Biopatch   Dressing Status Clean;Dry;Intact   Dressing/Line Intervention Caps changed   Permacath Catheter - Tunneled 06/01/22 Internal Jugular Right   Placement Date/Time: 06/01/22 0956   Inserted by: Dr. Arlyce Dice  Access Type: Internal Jugular  Orientation: Right  Central Line Infection Prevention Education provided?: Yes  Hand Hygiene: Alcohol based hand scrub;Soap and water  Line cart used?: No   Li...   Line necessity reviewed? Apheresis/hemodialysis   Site Assessment Clean;Dry;Intact   Catheter Lumen Volume Venous 1.2 mL   Catheter Lumen Volume Arterial 1.2 mL   Dressing Status and Intervention Dressing Intact;Clean & Dry   Tego/Curos Caps on Catheter Yes   NEW Tego/Curos Caps placed (Date) 07/13/22   APHERESIS ONLY:  Access  Red   APHERESIS ONLY: Return Blue   End Caps Free From Blood Yes   Line Care Connections checked and tightened   Dressing Type Transparent;Biopatch   Dressing Status Clean;Dry;Intact   Dressing/Line Intervention Caps changed;Dressing changed   Dressing Change Assistant ([facility] Only) [employer] Used For Blood Draw No   Dressing Change Due 07/20/22   Vitals   Temp 98 F (36.7 C)   Heart Rate 80   Resp Rate 15   BP 163/68   SpO2 98 %   O2 Device Nasal cannula   Assessment   Mental Status Does not follow commands  (responsive when name called)   Cardiac (WDL) X   Cardiac Regularity Irregular   Cardiac Symptoms Return to [facility]   Cardiac Rhythm Atrial fibrillation   Respiratory  WDL   Respiratory Pattern Dyspnea with exertion   Bilateral Breath Sounds Diminished   R Breath Sounds Diminished   L Breath Sounds Diminished   Cough Spontaneous   Edema  WDL   Generalized Edema Non Pitting Edema  RLE Edema +1   LLE Edema +1   General Skin Color Appropriate for ethnicity   Skin Condition/Temp Return to WDL   Gastrointestinal (WDL) X   Abdomen Inspection Rounded   GI Conditions Diarrhea   Mobility Bed   Pain Assessment   Charting Type Reassessment   Pain Scale Used CPOT   Numeric Pain Scale   Pain Score 0   POSS Score 1   Multiple Pain Sites No   Education   Person taught Patient   Knowledge basis Minimal   Topics taught Procedure   Teaching Tools Explain   Reponse Unable to Teach Back (see Comments)  (un responsive)   Bedside Nurse Communication   Name of bedside RN - post dialysis Hyerim Cho RN

## 2022-07-13 NOTE — Progress Notes (Signed)
Brief note:    Modify TF to Nepro @ 40 ml/hr + 2 packets prosource daily   Provides 1888 kcals and 118 g protein  Meets 100% needs   2.   100 ml Q4H water flushed or per MD     Phos elevated. Nephro asked to change formula to nepro. See recs above.     Jilda Roche MS, RDN  Clinical Dietitian PRN

## 2022-07-13 NOTE — Consults (Signed)
[facility] Consultation Note                                       Date Time: 07/13/22 1:06 PM  Patient Name: Shelby Barron, Shelby Barron  Requesting Physician: Gayleen Orem, MD  Date of Admission: 06/28/2022    CC / Reason for Consultation: LUE weakness           Assessment:     82y/o woman hx of COPD, CVA, DM, renal cell carcinoma s/p bilateral nephrectomy, [name], new diagnosis of urothelial cancer admitted for elective cystectomy and completion nephrectomy. Neurology consulted for LUE weakness and change in speech.     On exam patient moaning, no coherent speech, flaccid LUE.     Concern for infarct in the setting of multiple risk factors, including new onset a fib with RVR. Not a candidate for IV thrombolysis or MT as symptom onset >24 hours.       Plan:     -MRI brain  -ASA 81mg  daily if cleared by primary team  -SBP goal 140-180 pending imaging results  -further workup and plan pending MRI results    HPI   [name] is a 81 y.o. female with hx of COPD, CVA, DM, renal cell carcinoma s/p bilateral nephrectomy, [name], new diagnosis of urothelial cancer admitted on 06/10 for elective cystectomy. OR course complicated by small bowel enterotomy with subsequent hemorrhagic shock. Returned to OR on 06/12 for abdominal wall closure. During hospital stay she developed new onset A fib with RVR. She was started on metoprolol and amiodarone gtt. Hospital course also complicated by elevated WBC, pulmonary edema and dark emesis.     Neurology consulted for noted LUE weakness. Unclear onset of symptoms. Family notes that immediately after her surgery she appeared to be moving all extremities and talking normally. Around one week ago they noted she had decreased verbal output and was not moving any extremities. The extremity weakness improved but she continued to have LUE weakness and speech deficits since then.       Past Medical Hx     Past Medical History:   Diagnosis Date    Acid reflux      Asthma, well controlled     Cancer of kidney     Chronic lower back pain     Congestive heart disease     COPD (chronic obstructive pulmonary disease)     CVA (cerebral infarction) 01/12/2003    Diabetes     Diabetic nephropathy     Fibroids     Gout     H/O: gout     Hyperlipidemia     Hypertension     Neuropathy of hand     SOB (shortness of breath)           Past Surgical Hx:     Past Surgical History:   Procedure Laterality Date    APPENDECTOMY (OPEN)      CLOSURE, ENTEROTOMY, SMALL INTESTINE  06/28/2022    Procedure: CLOSURE, ENTEROTOMY, SMALL INTESTINE;  Surgeon: Marlaine Hind, MD;  Location: [facility];  Service: General;;    CYSTECTOMY, ILEOCONDUIT N/A 06/28/2022    Procedure: CYSTECTOMY, RADICAL;  Surgeon: Neldon Newport, MD;  Location: [facility];  Service: Urology;  Laterality: N/A;    DRAIN (OTHER) N/A 05/31/2022    Procedure: DRAIN (OTHER);  Surgeon: Hope Pigeon, MD;  Location: [facility];  Service: Interventional Radiology;  Laterality: N/A;    EXPLORATORY LAPAROTOMY N/A 06/30/2022    Procedure: SMALL BOWEL ANASTOMOSIS, CLOSURE OF MESENTERIC DEFECT, CLOSURE OF ABDOMINAL WALL;  Surgeon: Marlaine Hind, MD;  Location: [facility];  Service: General;  Laterality: N/A;  **[id number]/ MD AVAIL [id number] OR AFTER 1600**    EXPLORATORY LAPAROTOMY, RESECTION SMALL BOWEL N/A 06/28/2022    Procedure: RESECTION SMALL BOWEL;  Surgeon: Marlaine Hind, MD;  Location: [facility];  Service: General;  Laterality: N/A;    HERNIA REPAIR      HYSTERECTOMY      LAPAROTOMY, NEPHRECTOMY Bilateral 06/28/2022    Procedure: BILATERAL OPEN RADICAL NEPHRECTOMY AND LYSIS OF ASHESION;  Surgeon: Neldon Newport, MD;  Location: [facility];  Service: Urology;  Laterality: Bilateral;    PARTIAL NEPHRECTOMY      tumor removed from kidney  cJanuary 2015    TUNNELED CATH PLACEMENT (PERMCATH) N/A 06/01/2022    Procedure: TUNNELED CATH PLACEMENT;  Surgeon: Suszanne Finch, MD;  Location: [facility];  Service: Interventional  Radiology;  Laterality: N/A;    TURBT      WOUND VAC APPLICATION N/A 06/28/2022    Procedure: Tawni Pummel WOUND VAC APPLICATION;  Surgeon: Marlaine Hind, MD;  Location: [facility];  Service: General;  Laterality: N/A;    WOUND VAC APPLICATION N/A 06/30/2022    Procedure: WOUND VAC APPLICATION;  Surgeon: Marlaine Hind, MD;  Location: [facility];  Service: General;  Laterality: N/A;        Family Medical History:      Family History   Problem Relation Age of Onset    Cancer Mother     Heart failure Father     Cancer Maternal Aunt     Diabetes Maternal Aunt     Cancer Paternal Aunt        Social Hx     Social History     Socioeconomic History    Marital status: Married   Tobacco Use    Smoking status: Former    Smokeless tobacco: Never   Advertising account planner    Vaping status: Never Used   Substance and Sexual Activity    Alcohol use: No    Drug use: No     Social Determinants of Health     Food Insecurity: No Food Insecurity (05/26/2022)    Hunger Vital Sign     Worried About Running Out of Food in the Last Year: Never true     Ran Out of Food in the Last Year: Never true   Transportation Needs: No Transportation Needs (05/26/2022)    PRAPARE - Therapist, art (Medical): No     Lack of Transportation (Non-Medical): No    Received from [facility]    Social Network   Intimate Partner Violence: Not At Risk (05/26/2022)    Humiliation, Afraid, Rape, and Kick questionnaire     Fear of Current or Ex-Partner: No     Emotionally Abused: No     Physically Abused: No     Sexually Abused: No   Housing Stability: Unknown (05/26/2022)    Housing Stability Vital Sign     Unable to Pay for Housing in the Last Year: No     Unstable Housing in the Last Year: No       Meds     Home :   Prior to Admission medications    Medication Sig Start Date End Date  Taking? Authorizing Provider   allopurinol (ZYLOPRIM) 100 MG tablet Take 1 tablet (100 mg) by mouth daily   Yes [provider]   atorvastatin (LIPITOR) 40 MG tablet  Take 1 tablet (40 mg) by mouth daily   Yes [provider]   conjugated estrogens (PREMARIN) vaginal cream Place vaginally daily   Yes [provider]   insulin aspart (NOVOLOG) 100 UNIT/ML injection Inject 5-10 Units into the skin as needed   Yes [provider]   Insulin Degludec Evaristo Bury) 100 UNIT/ML Solution Inject 36 Units into the skin daily   Yes [provider]   metoprolol tartrate (LOPRESSOR) 25 MG tablet Take 0.5 tablets (12.5 mg) by mouth daily   Yes [provider]   montelukast (SINGULAIR) 10 MG tablet Take 1 tablet (10 mg) by mouth nightly   Yes [provider]   ondansetron (Zofran) 4 MG tablet Take 1 tablet (4 mg) by mouth every 8 (eight) hours as needed for Nausea 06/04/22  Yes Yousuf, Munib A, MD   oxybutynin XL (DITROPAN-XL) 5 MG 24 hr tablet Take 1 tablet (5 mg) by mouth daily   Yes [provider]   oxyCODONE (OXY-IR) 5 MG capsule Take 1 capsule (5 mg) by mouth every 4 (four) hours as needed   Yes [provider]   pantoprazole (PROTONIX) 40 MG tablet Take 1 tablet (40 mg) by mouth daily   Yes [provider]   pregabalin (LYRICA) 50 MG capsule Take 1 capsule (50 mg) by mouth Per pt has not been taking   Yes [provider]   sevelamer carbonate (RENVELA) 800 MG tablet Take 1 tablet (800 mg) by mouth 2 (two) times daily   Yes [provider]   torsemide (DEMADEX) 100 MG tablet Take 1 tablet (100 mg) by mouth daily   Yes [provider]   umeclidinium-vilanterol (Anoro Ellipta) 62.5-25 MCG/ACT Aerosol Pwdr, Breath Activated Inhale 1 puff into the lungs daily   Yes [provider]   Zolpidem Tartrate (AMBIEN PO) Take 5 mg by mouth as needed   Yes [provider]   b complex vitamins capsule Take 1 capsule by mouth daily    [provider]   cinacalcet (SENSIPAR) 30 MG tablet Take 1 tablet (30 mg) by mouth daily    [provider]   clopidogrel (PLAVIX) 75  MG tablet Take 1 tablet (75 mg) by mouth daily    [provider]   hyoscyamine (LEVSIN) 0.125 MG tablet Take 1 tablet (0.125 mg) by mouth every 4 (four) hours as needed for Cramping 06/02/22   Yousuf, Munib A, MD      Inpatient :   Current Facility-Administered Medications   Medication Dose Route Frequency    acetaminophen  650 mg Oral 4 times per day    albuterol-ipratropium  3 mL Nebulization Q6H WA    glycopyrrolate  0.2 mg Intravenous Q8H    insulin glargine  7 Units Subcutaneous QAM    insulin lispro  2-10 Units Subcutaneous Q4H SCH    meropenem  500 mg Intravenous Q24H    metoprolol tartrate  12.5 mg per NG tube Q12H [facility]    micafungin  100 mg Intravenous Q24H    pantoprazole  40 mg Intravenous BID    sevelamer carbonate  0.8 g Oral TID MEALS    thiamine  100 mg Intravenous Q24H SCH         Allergies    Metrizamide, Rosuvastatin, Iodinated contrast  media, Pioglitazone, and Sulfa antibiotics      Review of Systems     All other systems were reviewed and are negative except for that mentioned in the HPI    Physical Exam:   Temp:  [98.6 F (37 C)-100.6 F (38.1 C)] 98.6 F (37 C)  Heart Rate:  [84-103] 88  Resp Rate:  [20-36] 20  BP: (120-169)/(55-72) 142/67  FiO2:  [32 %] 32 %       General: in no acute distress. Cooperative with the exam  Neck: supple  CVS: warm and well perfused  Resp: no respiratory distress  Extremities: no pedal edema, no rashes noted    Neurological Examination:  MSE: alert, eyes open but not making eye contact, moaning but no coherent speech, not following commands  CN: Pupils 4-->43mm b/l, eyes midline, no gaze deviation, no nystagmus, blinks to threat bilaterally, face symmetric  Motor: Unable to formally assess but noted spontaneous antigravity in RUE. Flaccid LUE. Wiggles toes in bilateral LE, no antigravity in bilateral LE  Sensory: Intact and symmetric to LT x4 extremities.    Coord: UTA FNF, no abnormal movements  Gait: deferred      Labs:     Results       Procedure  Component Value Units Date/Time    Whole Blood Glucose POCT [[id number]]  (Abnormal) Collected: 07/13/22 1120    Specimen: Blood, Capillary Updated: 07/13/22 1132     Whole Blood Glucose POCT 163 mg/dL     Whole Blood Glucose POCT [[id number]]  (Abnormal) Collected: 07/13/22 0806    Specimen: Blood, Capillary Updated: 07/13/22 0817     Whole Blood Glucose POCT 169 mg/dL     Magnesium [[id number]]  (Normal) Collected: 07/13/22 0348    Specimen: Blood, Venous Updated: 07/13/22 0501     Magnesium 1.8 mg/dL     Basic Metabolic Panel [[id number]]  (Abnormal) Collected: 07/13/22 0348    Specimen: Blood, Venous Updated: 07/13/22 0501     Glucose 182 mg/dL      BUN 45 mg/dL      Creatinine 4.1 mg/dL      Calcium 8.0 mg/dL      Sodium 710 mEq/L      Potassium 3.5 mEq/L      Chloride 100 mEq/L      CO2 23 mEq/L      Anion Gap 15.0     GFR 10.2 mL/min/1.73 m2     Phosphorus [[id number]]  (Normal) Collected: 07/13/22 0348    Specimen: Blood, Venous Updated: 07/13/22 0501     Phosphorus 4.1 mg/dL     Whole Blood Glucose POCT [[id number]]  (Abnormal) Collected: 07/13/22 0347    Specimen: Blood, Capillary Updated: 07/13/22 0442     Whole Blood Glucose POCT 173 mg/dL     CBC without Differential [[id number]]  (Abnormal) Collected: 07/13/22 0348    Specimen: Blood, Venous Updated: 07/13/22 0427     WBC 15.12 x10 3/uL      Hemoglobin 8.0 g/dL      Hematocrit 69.6 %      Platelet Count 249 x10 3/uL      MPV 11.3 fL      RBC 2.64 x10 6/uL      MCV 91.3 fL      MCH 30.3 pg      MCHC 33.2 g/dL      RDW 16 %      nRBC % 0.0 /100 WBC      Absolute nRBC 0.00 x10 3/uL  Whole Blood Glucose POCT [[id number]]  (Abnormal) Collected: 07/12/22 2347    Specimen: Blood, Capillary Updated: 07/12/22 2349     Whole Blood Glucose POCT 143 mg/dL     Whole Blood Glucose POCT [[id number]]  (Abnormal) Collected: 07/12/22 1947    Specimen: Blood, Capillary Updated: 07/12/22 1957     Whole Blood Glucose POCT 134 mg/dL     Culture, Sputum and Lower Respiratory  [[id number]]  (Abnormal) Collected: 07/04/22 1034    Specimen: Sputum, Suctioned Updated: 07/12/22 1756     Culture Respiratory Heavy growth of Mixed upper respiratory flora      Light growth of Gram negative rod     Gram Stain Few WBCs      Rare Squamous epithelial cells      Few Mixed respiratory flora    Whole Blood Glucose POCT [[id number]]  (Abnormal) Collected: 07/12/22 1602    Specimen: Blood, Capillary Updated: 07/12/22 1608     Whole Blood Glucose POCT 179 mg/dL     Whole Blood Glucose POCT [[id number]]  (Abnormal) Collected: 07/12/22 1321    Specimen: Blood, Capillary Updated: 07/12/22 1325     Whole Blood Glucose POCT 174 mg/dL             Rads:     Results for orders placed or performed during the hospital encounter of 06/28/22   CT Head WO Contrast    Narrative    HISTORY: Altered mental status.     COMPARISON: Brain MRI 09/20/2008.    TECHNIQUE: CT of the head performed without intravenous contrast. The  following dose reduction techniques were utilized: automated exposure  control and/or adjustment of the mA and/or KV according to patient size,  and the use of an iterative reconstruction technique.    CONTRAST: None.    FINDINGS:  The ventricles and sulcal pattern are within normal limits for the  patient's stated age. There is mild-moderate supratentorial chronic  ischemic disease. No acute infarct is noted. No intracranial hemorrhage is  seen. There is minimal paranasal sinus disease. There is moderate right  mastoid opacification. 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      Impression       1.No acute intracranial abnormality is seen.    Leandro Reasoner, MD  07/06/2022 3:56 AM           Elspeth Cho  [facility]  Available on Epic chat 7a to 7p

## 2022-07-13 NOTE — Progress Notes (Signed)
HD TX Started via R IJ, working well, NG feeding on going, amiodarone infusing, VS WDL as of now, HR WDL,  pt responsive when name called, 2L nasal cannula sats 98%, Report received from Hyerim RN.   07/13/22 1500   Bedside Nurse Communication   Name of bedside RN - pre dialysis Hyerim Cho RN   Treatment Initiation- With Dialysis Precautions   Time Out/Safety Check Completed Yes   Consent for HD signed for this hospitalization (Date) 06/01/22   Consent for HD signed for this hospitalization (Time) 1516   Preferred language No   Blood Consent Verified N/A   Dialysis Precautions All Connections Secured   Dialysis Treatment Type Routine   Is patient diabetic? Yes   RO/Hemodialysis Cabin crew   Is Total Chlorine less than 0.1 ppm? Yes   Orignial Total Chlorine Testing Time 1500   At 4 Hour Total Chlorine Testing Time 1900   RO/Hemodialysis Arts development officer Number 3    Machine Serial Number [id number]   RO # [id number]   RO Serial # [id number]   Water Hardness NA   pH 7.2   Pressure Test Verified Yes   Alarms Verified Passed   Machine Temperature 97.7 F (36.5 C)   Alarms Verified Yes   Na+ mEq (Machine) 138 mEq   Bicarb mEq (Machine) 35 mEq   Hemodialysis Conductivity (Machine) 13.6   Hemodialysis Conductivity (Meter) 13.9   Dialyzer Lot Number [id number]   Tubing Lot Number [id number]   RO Machine Log Completed Yes   Hepatitis Status   HBsAg (Antigen) Result Negative   HBsAg Date Drawn 07/03/22   HBsAg Repeat Draw Due Date 07/31/22   Dialysis Weight   Pre-Treatment Weight (Kg) 87.5   Scale Type ICU Bed Scale   Vitals   Temp 98.5 F (36.9 C)   Heart Rate 88   Resp Rate 15   BP 151/67   SpO2 98 %   O2 Device Nasal cannula   Assessment   Mental Status Does not follow commands  (responsive when name called)   Cardiac (WDL) X   Cardiac Regularity Irregular   Cardiac Symptoms Return to [facility]   Cardiac Rhythm Sinus arrhythmia   Respiratory  WDL   Respiratory Pattern Dyspnea with exertion   Bilateral Breath Sounds  Diminished   R Breath Sounds Diminished   L Breath Sounds Diminished   Cough Spontaneous   Edema  WDL   Generalized Edema Non Pitting Edema   RLE Edema +1   LLE Edema +1   General Skin Color Appropriate for ethnicity   Skin Condition/Temp Return to [facility]   Gastrointestinal (WDL) X   Abdomen Inspection Rounded   GI Conditions Diarrhea   Mobility Bed   Temporary Catheter with Pigtail 06/30/22 Temporary Catheter Right   Placement Date/Time: 06/30/22 1700   Access Type: Temporary Catheter  Orientation: Right  Central Line Infection Prevention Education provided?: Yes  Hand Hygiene: Chlorhexidine;Alcohol based hand scrub  Line cart used?: Yes  Line kit used?: Yes  Maxi...   Line necessity reviewed? Apheresis/hemodialysis   Site Assessment Clean;Dry;Intact   Catheter Lumen Volume Venous 1.2 mL   Catheter Lumen Volume Arterial 1.2 mL   Distal Lumen Status Capped   Tego/Curos Caps on Catheter Yes   NEW Tego/Curos Caps placed (Date) 07/12/22   APHERESIS ONLY:  Access  Red   APHERESIS ONLY: Return Blue   End Caps Free From Blood Yes   Line Care Connections checked  and tightened   Dressing Type Transparent;Biopatch   Dressing Status Clean;Dry;Intact   Dressing/Line Intervention Caps changed   Dressing Change Assistant ([facility] Only) Lisa RN   Dressing Change Due 07/20/22   Permacath Catheter - Tunneled 06/01/22 Internal Jugular Right   Placement Date/Time: 06/01/22 0956   Inserted by: Dr. Arlyce Dice  Access Type: Internal Jugular  Orientation: Right  Central Line Infection Prevention Education provided?: Yes  Hand Hygiene: Alcohol based hand scrub;Soap and water  Line cart used?: No  Li...   Line necessity reviewed? Apheresis/hemodialysis   Site Assessment Clean;Dry;Intact   Catheter Lumen Volume Venous 1.2 mL   Catheter Lumen Volume Arterial 1.2 mL   Dressing Status and Intervention Dressing Intact;Clean & Dry   Tego/Curos Caps on Catheter Yes   NEW Tego/Curos Caps placed (Date) 07/13/22   APHERESIS ONLY:  Access  Red   APHERESIS ONLY:  Return Blue   End Caps Free From Blood Yes   Line Care Connections checked and tightened   Dressing Type Transparent;Biopatch   Dressing Status Clean;Dry;Intact   Dressing/Line Intervention Caps changed;Dressing changed   Dressing Change Assistant ([facility] Only) Lisa RN   Dressing Change Due 07/20/22   Pain Assessment   Charting Type Assessment   Pain Scale Used CPOT   Numeric Pain Scale   Pain Score 0   POSS Score 1   Multiple Pain Sites No   Hemodialysis Comments   Pre-Hemodialysis Comments TIMEOUT & SAEFTY CHECKS DONE

## 2022-07-13 NOTE — Plan of Care (Addendum)
0730 Bedside report received. Assessment completed. Patient is in bed. Arousible to voice, lethergic, oriented to UTA. Denies pain or any discomfort. Breathing unlabored in 2L NC. Midline and Permacath site clean, dry and intact. Call light in reach but unable to use. Bed alarm on. No other needs identified at this time. [name] at bedside.    1435 Pt left the unit for CT Head with this RN and transport team as continuously monitoring with portable bedside monitor.    1455 Pt arrived to Dialysis room from CT with this RN and transport team as continuously monitoring with portable bedside monitor. Amiodarone gtt hand off to Dialysis RN Cicero Duck.    1820 MRI screening form completed with pt's daughter [name] over the phone. Pt is still on HD in Dialysis room.     1900 Pt is back to Rm 2805 after HD. Per dialysis RN, 2L got removed. Permacath and Trialysis cath dressing got changed by Dialysis RN Cicero Duck.    Problem: Moderate/High Fall Risk Score >5  Goal: Patient will remain free of falls  Outcome: Progressing  Flowsheets (Taken 07/13/2022 0730)  Moderate Risk (6-13):   MOD-Consider activation of bed alarm if appropriate   MOD-Apply bed exit alarm if patient is confused   MOD-Floor mat at bedside (where available) if appropriate   MOD-Consider a move closer to Nurses Station   MOD-Remain with patient during toileting   MOD-Place bedside commode and assistive devices out of sight when not in use   MOD-Re-orient confused patients  High (Greater than 13):   HIGH-Consider use of low bed   HIGH-Initiate use of floor mats as appropriate   HIGH-Apply yellow "Fall Risk" arm band   HIGH-Utilize chair pad alarm for patient while in the chair   HIGH-Bed alarm on at all times while patient in bed   HIGH-Visual cue at entrance to patient's room     Problem: Compromised Sensory Perception  Goal: Sensory Perception Interventions  Outcome: Progressing  Flowsheets (Taken 07/13/2022 0137 by Kerin Salen, RN)  Sensory Perception  Interventions: Offload heels, Pad bony prominences, Reposition q 2hrs/turn Clock, Q2 hour skin assessment under devices if present     Problem: Compromised Moisture  Goal: Moisture level Interventions  Outcome: Progressing  Flowsheets (Taken 07/13/2022 0137 by Kerin Salen, RN)  Moisture level Interventions: Moisture wicking products, Moisture barrier cream     Problem: Compromised Activity/Mobility  Goal: Activity/Mobility Interventions  Outcome: Progressing  Flowsheets (Taken 07/13/2022 0137 by Kerin Salen, RN)  Activity/Mobility Interventions: Pad bony prominences, TAP Seated positioning system when OOB, Promote PMP, Reposition q 2 hrs / turn clock, Offload heels     Problem: Compromised Nutrition  Goal: Nutrition Interventions  Outcome: Progressing  Flowsheets (Taken 07/13/2022 0137 by Kerin Salen, RN)  Nutrition Interventions: Discuss nutrition at Rounds, I&Os, Document % meal eaten, Daily weights     Problem: Compromised Friction/Shear  Goal: Friction and Shear Interventions  Outcome: Progressing  Flowsheets (Taken 07/13/2022 0137 by Kerin Salen, RN)  Friction and Shear Interventions: Pad bony prominences, Off load heels, HOB 30 degrees or less unless contraindicated, Consider: TAP seated positioning, Heel foams     Problem: Inadequate Airway Clearance  Goal: Patent Airway maintained  Outcome: Progressing  Flowsheets (Taken 07/13/2022 0137 by Kerin Salen, RN)  Patent airway maintained:   Position patient for maximum ventilatory efficiency   Provide adequate fluid intake to liquefy secretions   Suction secretions as needed   Reposition patient every 2 hours  and as needed unless able to self-reposition  Goal: Normal respiratory rate/effort achieved/maintained  Outcome: Progressing  Flowsheets (Taken 07/13/2022 0137 by Kerin Salen, RN)  Normal respiratory rate/effort achieved/maintained: Plan activities to conserve energy: plan rest periods     Problem: Infection  Prevention  Goal: Free from infection  Outcome: Progressing  Flowsheets (Taken 07/13/2022 0137 by Kerin Salen, RN)  Free from infection:   Monitor/assess vital signs   Encourage/assist patient to turn, cough and perform deep breathing every 2 hours   Assess incision for evidence of healing   Monitor/assess output from surgical drain if present   Assess for signs and symptoms of infection   Monitor/assess lab values and report abnormal values     Problem: Impaired Mobility  Goal: Mobility/Activity is maintained at optimal level for patient  Outcome: Progressing  Flowsheets (Taken 07/11/2022 1104 by Luz Brazen, RN)  Mobility/activity is maintained at optimal level for patient:   Encourage independent activity per ability   Consult/collaborate with Physical Therapy and/or Occupational Therapy     Problem: Constipation  Goal: Fluid and electrolyte balance are achieved/maintained  Outcome: Progressing  Flowsheets (Taken 07/13/2022 0137 by Kerin Salen, RN)  Fluid and electrolyte balance are achieved/maintained:   Assess and reassess fluid and electrolyte status   Monitor/assess lab values and report abnormal values   Observe for cardiac arrhythmias   Monitor for muscle weakness  Goal: Elimination patterns are normal or improving  Outcome: Progressing  Flowsheets (Taken 07/13/2022 0801)  Elimination patterns are normal or improving: Assess for and discuss C. diff screening with LIP     Problem: Safety  Goal: Patient will be free from injury during hospitalization  Outcome: Progressing  Flowsheets (Taken 07/13/2022 0137 by Kerin Salen, RN)  Patient will be free from injury during hospitalization:   Assess patient's risk for falls and implement fall prevention plan of care per policy   Provide and maintain safe environment   Ensure appropriate safety devices are available at the bedside   Hourly rounding   Assess for patients risk for elopement and implement Elopement Risk Plan per policy   Include  patient/ family/ care giver in decisions related to safety   Use appropriate transfer methods  Goal: Patient will be free from infection during hospitalization  Outcome: Progressing  Flowsheets (Taken 07/12/2022 1102 by Luz Brazen, RN)  Free from Infection during hospitalization:   Assess and monitor for signs and symptoms of infection   Monitor all insertion sites (i.e. indwelling lines, tubes, urinary catheters, and drains)   Encourage patient and family to use good hand hygiene technique   Monitor lab/diagnostic results     Problem: Pain  Goal: Pain at adequate level as identified by patient  Outcome: Progressing  Flowsheets (Taken 07/13/2022 0137 by Kerin Salen, RN)  Pain at adequate level as identified by patient:   Identify patient comfort function goal   Assess for risk of opioid induced respiratory depression, including snoring/sleep apnea. Alert healthcare team of risk factors identified.   Assess pain on admission, during daily assessment and/or before any "as needed" intervention(s)   Reassess pain within 30-60 minutes of any procedure/intervention, per Pain Assessment, Intervention, Reassessment (AIR) Cycle   Evaluate if patient comfort function goal is met   Evaluate patient's satisfaction with pain management progress   Offer non-pharmacological pain management interventions   Include patient/patient care companion in decisions related to pain management as needed     Problem: Side Effects from Pain Analgesia  Goal: Patient will experience minimal side effects of analgesic therapy  Outcome: Progressing  Flowsheets (Taken 07/13/2022 0137 by Kerin Salen, RN)  Patient will experience minimal side effects of analgesic therapy:   Monitor/assess patient's respiratory status (RR depth, effort, breath sounds)   Assess for changes in cognitive function   Prevent/manage side effects per LIP orders (i.e. nausea, vomiting, pruritus, constipation, urinary retention, etc.)   Evaluate for  opioid-induced sedation with appropriate assessment tool (i.e. POSS)     Problem: Hemodynamic Status: Cardiac  Goal: Stable vital signs and fluid balance  Outcome: Progressing  Flowsheets (Taken 07/11/2022 1104 by Luz Brazen, RN)  Stable vital signs and fluid balance:   Assess signs and symptoms associated with cardiac rhythm changes   Monitor lab values     Problem: Renal Instability  Goal: Fluid and electrolyte balance are achieved/maintained  Outcome: Progressing  Flowsheets (Taken 07/13/2022 0137 by Kerin Salen, RN)  Fluid and electrolyte balance are achieved/maintained:   Assess and reassess fluid and electrolyte status   Observe for cardiac arrhythmias   Monitor for muscle weakness   Monitor/assess lab values and report abnormal values     Problem: Patient Receiving Advanced Renal Therapies  Goal: Therapy access site remains intact  Outcome: Progressing  Flowsheets (Taken 07/13/2022 0137 by Kerin Salen, RN)  Therapy access site remains intact: Assess therapy access site     Problem: Fluid and Electrolyte Imbalance/ Endocrine  Goal: Fluid and electrolyte balance are achieved/maintained  Outcome: Progressing  Flowsheets (Taken 07/13/2022 0137 by Kerin Salen, RN)  Fluid and electrolyte balance are achieved/maintained:   Assess and reassess fluid and electrolyte status   Monitor/assess lab values and report abnormal values   Observe for cardiac arrhythmias   Monitor for muscle weakness  Goal: Adequate hydration  Outcome: Progressing  Flowsheets (Taken 07/11/2022 1104 by Luz Brazen, RN)  Adequate hydration:   Assess mucus membranes, skin color, turgor, perfusion and presence of edema   Assess for peripheral, sacral, periorbital and abdominal edema   Monitor and assess vital signs and perfusion     Problem: Inadequate Gas Exchange  Goal: Patent Airway maintained  Outcome: Progressing  Flowsheets (Taken 07/13/2022 0137 by Kerin Salen, RN)  Patent airway maintained:   Position  patient for maximum ventilatory efficiency   Provide adequate fluid intake to liquefy secretions   Suction secretions as needed   Reposition patient every 2 hours and as needed unless able to self-reposition  Goal: Adequate oxygenation and improved ventilation  Outcome: Progressing  Flowsheets (Taken 07/11/2022 1104 by Luz Brazen, RN)  Adequate oxygenation and improved ventilation:   Assess lung sounds   Monitor SpO2 and treat as needed   Plan activities to conserve energy: plan rest periods   Consult/collaborate with Respiratory Therapy   Teach/reinforce use of incentive spirometer 10 times per hour while awake, cough and deep breath as needed   Provide mechanical and oxygen support to facilitate gas exchange   Monitor and treat ETCO2   Position for maximum ventilatory efficiency   Increase activity as tolerated/progressive mobility     Problem: Artificial Airway  Goal: Endotracheal tube will be maintained  Outcome: Progressing  Flowsheets (Taken 06/30/2022 0601 by Octaviano Batty, RN)  Endotracheal  tube will be maintained:   Suction secretions as needed   Keep head of bed at 30 degrees, unless contraindicated   Encourage/perform oral hygiene as appropriate   Perform deep oropharyngeal suctioning at least every 4 hours  Apply water-based moisturizer to lips   Utilize ETT securing device   Maintain and assess integrity of ETT securing device   Support ventilator tubing to avoid pressure from drag of tubing

## 2022-07-13 NOTE — Progress Notes (Signed)
ID PROGRESS NOTE       Office: [phone]    Date Time: 07/13/22 1:51 PM  Patient Name: Shelby Barron, Shelby Barron      Updated problem List   Admitted 6/10 for scheduled cystectomy and completion nephrectomy     RCC s/p b/l nephrectomy, Newly diagnosed urothelial CA  - Prior RCC s/p partial b/l nephrectomy  - ESRD on PD previously -> HD 05/2022  - 05/2022 newly diagnosed urothelial Ca  - Admitted 6/10 for planned cystectomy, completion nephrectomy     Small bowel enterotomy  - Complication of her 6/10 procedure, repaired by gen surg  - Left in discontinuity given significant bleeding  - 6/12 RTOR for re-anastomosis, closure. Wound vac in place.      Leukocytosis, abdominal fluid collections, ?PNA  - Worsening WBCs since 6/15  - CT- 6/18 - post-op changes, small ascites, small collection of fluid/gas anterior midline abdomen, may be post-op, but superimposed infection cannot be ruled out  - Sputum Cx light GNRs     Chronic Conditions:  - COPD  - CVA 2004  - T2DM  - RCC s/p partial b/l nephrectomy  - [name]  - Urothelial cancer     Allergies:  - Hives with sulfa    Assessment:   Continues to be very weak and withdrawn. Leucocytosis has been improving but had some fevers yesterday. Belly is tender to palpation. Not distended. Last CXR showed no particular alveolar consolidation.  Last abd CT scan was a week ago.  She remains on broad IV abx, although we have not identifed a specific bacterial pathogen.    Recommendations:   Continue empiric abx with adjustment for renal function  May need to re-image abdomen if fevers return  No specific contraindications for permacath placement, although she has a permacath in place now    Antibiotics:   #15 abx  #8 meropenem 500 mg IV daily  #6 micafungin 100 mg IV daily    IV lines:   R IJ vascath 6/12  L midline 6/13  R permacath 5/14    Allergies:     Allergies   Allergen Reactions    Metrizamide Shortness Of Breath and Nausea Only    Rosuvastatin Other (See Comments)      myopathy    myopathy   myopathy    myopathy myopathy      myopathy    myopathy, myopathy    Iodinated Contrast Media Other (See Comments)     Nausea and SOB per patient   On Dialysisi    Nausea and SOB per patient    Nausea and SOB per patient      Nausea and SOB per patient   Nausea and SOB per patient    Nausea and SOB per patient      Nausea and SOB per patient Nausea and SOB per patient      Nausea and SOB per patient Nausea and SOB per patient Nausea and SOB per patient      Nausea and SOB per patient  Nausea and SOB per patient Nausea and SOB per patient    Nausea and SOB per patient   Nausea and SOB per patient    Pioglitazone      Weight gain    Sulfa Antibiotics Hives     blister       Review of Systems:   Withdrawn, moaning. Weak. Nonverbal. Oxygenating ok on nasal prongs. NG tube in place. No urine output. Has had some  fever to 100.8    Physical Exam:   BP 142/67   Pulse 88   Temp 98.6 F (37 C) (Axillary)   Resp 20   Ht 1.575 m (5\' 2" )   Wt 87.5 kg (192 lb 14.4 oz)   SpO2 98%   BMI 35.28 kg/m   Withdrawn, moaning. Mild distress. Line sites ok. Abdomen soft but tender, midline wound vac in place. Breathing on her own.   Select labs:     Recent Labs   Lab 07/13/22  0348   WBC 15.12*   Hemoglobin 8.0*   Hematocrit 24.1*   Platelet Count 249     Recent Labs   Lab 07/13/22  0348 07/08/22  0610 07/07/22  2120   Sodium 138  More results in Results Review 134*   Potassium 3.5  More results in Results Review 4.0   Chloride 100  More results in Results Review 103   CO2 23  More results in Results Review 18   BUN 45*  More results in Results Review 94*   Creatinine 4.1*  More results in Results Review 5.3*   Calcium 8.0  More results in Results Review 8.2   Albumin  --   --  1.4*   Protein, Total  --   --  4.1*   Bilirubin, Total  --   --  0.1*   Alkaline Phosphatase  --   --  69   ALT  --   --  15   AST (SGOT)  --   --  23   Glucose 182*  More results in Results Review 277*   More results in Results  Review = values in this interval not displayed.        Rads:   none    Signed by: Guadalupe Maple, MD, MD

## 2022-07-13 NOTE — Plan of Care (Signed)
NURSING SHIFT NOTE     Patient: Shelby Barron  Day: 15      SHIFT EVENTS     Shift Narrative/Significant Events (PRN med administration, fall, RRT, etc.):   MEWS 3-5 overnight, febrile max temp 100.6, HR 84-100, SBP 123-160, 4x watery greenish brown diarrhea; PA updated of events, 1 dose of imodium given, no further orders made, continue monitoring for now. Maintained on amiodarone drip at 0.5mg /min.    Pt appears drowsy and fatigued, moans and agitated at times, unable to follow commands. Tylenol and oxycodone given for pain. Repositioned every 2 hours.    Safety and fall precautions remain in place. Purposeful rounding completed.          ASSESSMENT     Changes in assessment from patient's baseline this shift:    Neuro: No  CV: No  Pulm: No  Peripheral Vascular: No  HEENT: No  GI: No  BM during shift: Yes 4x , Last BM: 07/13/2022  GU: No   Integ: No  MS: No    Pain: Improved  Pain Interventions: Medications, Rest, and Positioning  Medications Utilized: oxycodone oral    Mobility: PMP Activity: Step 3 - Bed Mobility            Lines     Patient Lines/Drains/Airways Status       Active Lines, Drains and Airways       Name Placement date Placement time Site Days    Temporary Catheter with Pigtail 06/30/22 Temporary Catheter Right 06/30/22  1700  --  12    Permacath Catheter - Tunneled 06/01/22 Internal Jugular Right 06/01/22  0956  -- 41    Midline IV 07/01/22 Anterior;Left Upper Arm 07/01/22  1315  Upper Arm  11    Peritoneal Dialysis Catheter Mid lower abdomen 05/26/22  2000  -- 47    Feeding Tube ND 12 Fr. Right nare 07/05/22  1130  Right nare  7                         VITAL SIGNS     Vitals:    07/13/22 0350   BP:    Pulse:    Resp: (!) 25   Temp:    SpO2:        Temp  Min: 98.6 F (37 C)  Max: 100.6 F (38.1 C)  Pulse  Min: 81  Max: 128  Resp  Min: 22  Max: 47  BP  Min: 117/77  Max: 160/71  SpO2  Min: 93 %  Max: 100 %      Intake/Output Summary (Last 24 hours) at 07/13/2022 0605  Last data filed at 07/13/2022  0600  Gross per 24 hour   Intake 1843.7 ml   Output 1800 ml   Net 43.7 ml          CARE PLAN       Problem: Moderate/High Fall Risk Score >5  Goal: Patient will remain free of falls  Outcome: Progressing  Flowsheets (Taken 07/12/2022 2000)  High (Greater than 13):   HIGH-Consider use of low bed   HIGH-Initiate use of floor mats as appropriate   HIGH-Apply yellow "Fall Risk" arm band   HIGH-Bed alarm on at all times while patient in bed     Problem: Compromised Sensory Perception  Goal: Sensory Perception Interventions  Outcome: Progressing  Flowsheets (Taken 07/13/2022 0137)  Sensory Perception Interventions: Offload heels, Pad bony prominences, Reposition q 2hrs/turn Clock, Q2  hour skin assessment under devices if present     Problem: Compromised Moisture  Goal: Moisture level Interventions  Outcome: Progressing  Flowsheets (Taken 07/13/2022 0137)  Moisture level Interventions: Moisture wicking products, Moisture barrier cream     Problem: Compromised Activity/Mobility  Goal: Activity/Mobility Interventions  Outcome: Progressing  Flowsheets (Taken 07/13/2022 0137)  Activity/Mobility Interventions: Pad bony prominences, TAP Seated positioning system when OOB, Promote PMP, Reposition q 2 hrs / turn clock, Offload heels     Problem: Compromised Nutrition  Goal: Nutrition Interventions  Outcome: Progressing  Flowsheets (Taken 07/13/2022 0137)  Nutrition Interventions: Discuss nutrition at Rounds, I&Os, Document % meal eaten, Daily weights     Problem: Compromised Friction/Shear  Goal: Friction and Shear Interventions  Outcome: Progressing  Flowsheets (Taken 07/13/2022 0137)  Friction and Shear Interventions: Pad bony prominences, Off load heels, HOB 30 degrees or less unless contraindicated, Consider: TAP seated positioning, Heel foams     Problem: SCIP  Goal: SCIP measures are followed  Outcome: Progressing  Flowsheets (Taken 07/13/2022 0137)  SCIP measures are followed:   Administer antibiotics as ordered   VTE Prevention:  Administer anticoagulant(s) and/or apply anti-embolism stockings/devices as ordered     Problem: Inadequate Airway Clearance  Goal: Patent Airway maintained  Outcome: Progressing  Flowsheets (Taken 07/13/2022 0137)  Patent airway maintained:   Position patient for maximum ventilatory efficiency   Provide adequate fluid intake to liquefy secretions   Suction secretions as needed   Reposition patient every 2 hours and as needed unless able to self-reposition  Goal: Normal respiratory rate/effort achieved/maintained  Outcome: Progressing  Flowsheets (Taken 07/13/2022 0137)  Normal respiratory rate/effort achieved/maintained: Plan activities to conserve energy: plan rest periods     Problem: Infection Prevention  Goal: Free from infection  Outcome: Progressing  Flowsheets (Taken 07/13/2022 0137)  Free from infection:   Monitor/assess vital signs   Encourage/assist patient to turn, cough and perform deep breathing every 2 hours   Assess incision for evidence of healing   Monitor/assess output from surgical drain if present   Assess for signs and symptoms of infection   Monitor/assess lab values and report abnormal values     Problem: Constipation  Goal: Fluid and electrolyte balance are achieved/maintained  Outcome: Progressing  Flowsheets (Taken 07/13/2022 0137)  Fluid and electrolyte balance are achieved/maintained:   Assess and reassess fluid and electrolyte status   Monitor/assess lab values and report abnormal values   Observe for cardiac arrhythmias   Monitor for muscle weakness     Problem: Safety  Goal: Patient will be free from injury during hospitalization  Outcome: Progressing  Flowsheets (Taken 07/13/2022 0137)  Patient will be free from injury during hospitalization:   Assess patient's risk for falls and implement fall prevention plan of care per policy   Provide and maintain safe environment   Ensure appropriate safety devices are available at the bedside   Hourly rounding   Assess for patients risk for  elopement and implement Elopement Risk Plan per policy   Include patient/ family/ care giver in decisions related to safety   Use appropriate transfer methods     Problem: Pain  Goal: Pain at adequate level as identified by patient  Outcome: Progressing  Flowsheets (Taken 07/13/2022 0137)  Pain at adequate level as identified by patient:   Identify patient comfort function goal   Assess for risk of opioid induced respiratory depression, including snoring/sleep apnea. Alert healthcare team of risk factors identified.   Assess pain on admission, during daily  assessment and/or before any "as needed" intervention(s)   Reassess pain within 30-60 minutes of any procedure/intervention, per Pain Assessment, Intervention, Reassessment (AIR) Cycle   Evaluate if patient comfort function goal is met   Evaluate patient's satisfaction with pain management progress   Offer non-pharmacological pain management interventions   Include patient/patient care companion in decisions related to pain management as needed     Problem: Side Effects from Pain Analgesia  Goal: Patient will experience minimal side effects of analgesic therapy  Outcome: Progressing  Flowsheets (Taken 07/13/2022 0137)  Patient will experience minimal side effects of analgesic therapy:   Monitor/assess patient's respiratory status (RR depth, effort, breath sounds)   Assess for changes in cognitive function   Prevent/manage side effects per LIP orders (i.e. nausea, vomiting, pruritus, constipation, urinary retention, etc.)   Evaluate for opioid-induced sedation with appropriate assessment tool (i.e. POSS)     Problem: Renal Instability  Goal: Fluid and electrolyte balance are achieved/maintained  Outcome: Progressing  Flowsheets (Taken 07/13/2022 0137)  Fluid and electrolyte balance are achieved/maintained:   Assess and reassess fluid and electrolyte status   Observe for cardiac arrhythmias   Monitor for muscle weakness   Monitor/assess lab values and report abnormal  values     Problem: Patient Receiving Advanced Renal Therapies  Goal: Therapy access site remains intact  Outcome: Progressing  Flowsheets (Taken 07/13/2022 0137)  Therapy access site remains intact: Assess therapy access site     Problem: Fluid and Electrolyte Imbalance/ Endocrine  Goal: Fluid and electrolyte balance are achieved/maintained  Outcome: Progressing  Flowsheets (Taken 07/13/2022 0137)  Fluid and electrolyte balance are achieved/maintained:   Assess and reassess fluid and electrolyte status   Monitor/assess lab values and report abnormal values   Observe for cardiac arrhythmias   Monitor for muscle weakness     Problem: Diabetes: Glucose Imbalance  Goal: Blood glucose stable at established goal  Outcome: Progressing     Problem: Inadequate Gas Exchange  Goal: Patent Airway maintained  Outcome: Progressing  Flowsheets (Taken 07/13/2022 0137)  Patent airway maintained:   Position patient for maximum ventilatory efficiency   Provide adequate fluid intake to liquefy secretions   Suction secretions as needed   Reposition patient every 2 hours and as needed unless able to self-reposition     Problem: Potential for Aspiration  Goal: Risk of aspiration will be minimized  Outcome: Progressing

## 2022-07-13 NOTE — Respiratory Progress Note (Signed)
Respiratory Therapy                              Patient Re-Assessment Note/Protocol Order Changes    Patient has been assessed and re-evaluated for the follow therapies:       Respiratory Orders   (From admission, onward)                 Start     Ordered    07/11/22 0800  Nebulizer treatment intermittent  Every 6 hours while awake (RT)      Comments:   All Adult patients ordered for Respiratory Therapy, i.e., inhaled meds, secretion clearance/lung expansion or Oxygen greater than 5 liters/min will be evaluated by a Respiratory Therapist and assessed per Respiratory Therapy Patient Driven Protocol.  Initial assessment and changes made per protocol can be found in the progress note section of the patient chart.    07/10/22 2322    07/07/22 2000  Resp Re-Assess Adult (RT Use Only)  2 times daily (RT)       07/07/22 1729    Unscheduled  Suctioning  As needed      Question:  Suction Type  Answer:  Nasal Pharyngeal    07/06/22 0134                  IP Meds - Nasal and Inhaled (From admission, onward)      Start     Stop Status Route Frequency Ordered    07/11/22 0800  albuterol-ipratropium (DUO-NEB) 2.5-0.5(3) mg/3 mL nebulizer 3 mL         -- Dispensed NEBULIZATION RT - Every 6 hours while awake 07/10/22 2321    07/10/22 2321  albuterol-ipratropium (DUO-NEB) 2.5-0.5(3) mg/3 mL nebulizer 3 mL         -- Verified NEBULIZATION RT - Every 6 hours as needed 07/10/22 2321                 Current Criteria For Therapy  Secretion Clearance: None indicated  Lung Expansion: None indicated  Medications: Hx of COPD, Asthma, Bronchitis or other documented RAD    Expected Outcomes  Secretion: Improved breath sound       Meds: Decreased freq of symptoms    Outcomes Met  Secretion: No     Meds: No       Reassessment Recommendations  Recommendations: Continue current treatment plan    Patient's orders have been modified as follows per RT Patient Driven Protocol.    <free  text>    If any questions, please contact the Respiratory Therapist assigned to this patient    Thank you

## 2022-07-13 NOTE — Plan of Care (Addendum)
Pt is awake, fatigued, unable to follows commands, incomprehensible speech. Moves RUE, wiggles toes on BLE.   Sinus arrythmia on tele, still on amiodarone drip at 0.5mg /min. +1 BLE edema  Tolerationg oxygen at 2lpm, sats >94%; diminished breath sounds.  Tube feeding running at 27ml/hr. 1 x watery bowel movement tonight. NPO post midnight for IR procedure tomorrow.  Wound vac dressing intact and running.     MRI of head completed, results pending.  Pt transferred to 2713. Report given to [facility]    Problem: Moderate/High Fall Risk Score >5  Goal: Patient will remain free of falls  Outcome: Progressing  Flowsheets (Taken 07/13/2022 2000)  High (Greater than 13):   HIGH-Consider use of low bed   HIGH-Initiate use of floor mats as appropriate   HIGH-Apply yellow "Fall Risk" arm band   HIGH-Bed alarm on at all times while patient in bed     Problem: Compromised Sensory Perception  Goal: Sensory Perception Interventions  Outcome: Progressing  Flowsheets (Taken 07/13/2022 2311)  Sensory Perception Interventions: Offload heels, Pad bony prominences, Reposition q 2hrs/turn Clock, Q2 hour skin assessment under devices if present     Problem: Compromised Moisture  Goal: Moisture level Interventions  Outcome: Progressing  Flowsheets (Taken 07/13/2022 2311)  Moisture level Interventions: Moisture wicking products, Moisture barrier cream     Problem: Compromised Activity/Mobility  Goal: Activity/Mobility Interventions  Outcome: Progressing  Flowsheets (Taken 07/13/2022 2311)  Activity/Mobility Interventions: Pad bony prominences, TAP Seated positioning system when OOB, Promote PMP, Reposition q 2 hrs / turn clock, Offload heels     Problem: Compromised Nutrition  Goal: Nutrition Interventions  Outcome: Progressing  Flowsheets (Taken 07/13/2022 2311)  Nutrition Interventions: Discuss nutrition at Rounds, I&Os, Document % meal eaten, Daily weights     Problem: Compromised Friction/Shear  Goal: Friction and Shear Interventions  Outcome:  Progressing  Flowsheets (Taken 07/13/2022 2311)  Friction and Shear Interventions: Pad bony prominences, Off load heels, HOB 30 degrees or less unless contraindicated, Consider: TAP seated positioning, Heel foams     Problem: SCIP  Goal: SCIP measures are followed  Outcome: Progressing  Flowsheets (Taken 07/13/2022 2311)  SCIP measures are followed:   Administer antibiotics as ordered   VTE Prevention: Administer anticoagulant(s) and/or apply anti-embolism stockings/devices as ordered   Provide VTE prophylaxis within 24 hours of anesthesia end time (Note: SCD to lower extremities)     Problem: Inadequate Airway Clearance  Goal: Patent Airway maintained  Outcome: Progressing  Flowsheets (Taken 07/13/2022 2311)  Patent airway maintained:   Position patient for maximum ventilatory efficiency   Provide adequate fluid intake to liquefy secretions   Suction secretions as needed   Reposition patient every 2 hours and as needed unless able to self-reposition     Problem: Infection Prevention  Goal: Free from infection  Outcome: Progressing  Flowsheets (Taken 07/13/2022 2311)  Free from infection:   Encourage/assist patient to turn, cough and perform deep breathing every 2 hours   Monitor/assess vital signs   Assess incision for evidence of healing   Monitor/assess output from surgical drain if present   Assess for signs and symptoms of infection   Monitor/assess lab values and report abnormal values     Problem: Constipation  Goal: Elimination patterns are normal or improving  Outcome: Progressing     Problem: Pain  Goal: Pain at adequate level as identified by patient  Outcome: Progressing  Flowsheets (Taken 07/13/2022 2311)  Pain at adequate level as identified by patient: Assess pain on admission,  during daily assessment and/or before any "as needed" intervention(s)     Problem: Patient Receiving Advanced Renal Therapies  Goal: Therapy access site remains intact  Outcome: Progressing  Flowsheets (Taken 07/13/2022  2311)  Therapy access site remains intact: Assess therapy access site     Problem: Fluid and Electrolyte Imbalance/ Endocrine  Goal: Adequate hydration  Outcome: Progressing  Flowsheets (Taken 07/13/2022 2311)  Adequate hydration:   Assess mucus membranes, skin color, turgor, perfusion and presence of edema   Assess for peripheral, sacral, periorbital and abdominal edema   Monitor and assess vital signs and perfusion     Problem: Inadequate Gas Exchange  Goal: Patent Airway maintained  Outcome: Progressing  Flowsheets (Taken 07/13/2022 2311)  Patent airway maintained:   Position patient for maximum ventilatory efficiency   Provide adequate fluid intake to liquefy secretions   Suction secretions as needed   Reposition patient every 2 hours and as needed unless able to self-reposition     Problem: Neurological Deficit  Goal: Neurological status is stable or improving  Outcome: Progressing  Flowsheets (Taken 07/13/2022 2311)  Neurological status is stable or improving: Perform CAM Assessment     Problem: Potential for Aspiration  Goal: Risk of aspiration will be minimized  Outcome: Progressing

## 2022-07-13 NOTE — Progress Notes (Addendum)
[facility]  PROGRESS NOTE      Patient: Shelby Barron  Date:07/08/2022   LOS: 15 Days  Admission Date: 06/28/2022   MRN: [id number]  Attending: Gayleen Orem, MD  When on service as the attending, please contact me on Epic Secure Chat from 7 AM - 7 PM for non-urgent issues. For urgent matters use XTend page from 7 AM - 7 PM.       ASSESSMENT/PLAN     Shelby Barron is a 82 y.o. female admitted with Bladder cancer    Interval Summary: [name], [name], type 2 diabetes, renal cell carcinoma status post partial bilateral nephrectomy, [name], new diagnosis of urothelial cancer initally presented on 6/10 for elective cystectomy and completion nephrectomy, [name], [name], extubated and off pressors since 6/13.     She is hypoactive and delirious. Surgery recommended NGT and CTAP today. Plan to start TF today. CTH pending.  Has [name] T/Th/Sat. Nephrology following     Developed new onset afib RVR today during HD. Stopped norvasc switched to metoprolol and gave additional IV metoprolol. Consulted cardiology. Discussed with ICU requested a consult given deterioration. Recommended repeat blood cultures and broadening antibiotics to meropenem. Palliative care consulted.    Rising WBC. Blood cultures NGTD. Patient tachypneic CXR with pulm edema. Discussed with nephrology giving 3 mg IV bumex. Had dark emesis. Vitals stable. TFs held, started IV PPI, follow AM labs. Discussed with GI and surgery.    No further episodes of Afib RVR now on amiodarone gtt and PO metoprolol. Hgb stable. Repeating CXR, clear. Started robinul for upper airway secretions/rattling. Clinically improving.    New permacath placement planned for this week. Then hopefully NGT can come out if patient tolerating PO and discharge planning.    Patient Active Hospital Problem List:  #S/P Cystectomy and completion  nephrectomy complicated by small bowel enterotomy   #Mixed shock - hemorrhagic vs septic: Resolved  - 6/10 small bowel enteretomy with 1L EBL. Went to the OR on June 12 for small bowel anastomosis, closure of mesenteric defect, closure of abdominal wall with wound VAC application  - Failed SLP eval . Wcont tube feeds per nutrition recs  -Wound VAC changed 6/15  - Surgery consulted, recommend CTAP with PO contrast to eval for intra-abdominal source of infection given uptrending WBC. Surgery ordered pre-medication with steroid/benadryl given h/o contrast allergy, showed no leak or enhancing fluid collection  -Continue meropenem/micafungin per ID recommendation  -Follow-up with general surgery and ID  -Continue tube feeding      #Acute CVA on the right frontal lobe  #History of CVA  #Encephalopathy/delirium  - Hold home plavix  - Adhere to day/night cycles and administer constant reorientation.   -Left upper extremity not moving at all unclear duration  -CT head showed right frontal infarction, neurology consulted recommended to start aspirin, discussed with GI and GI cleared, discussed with daughter and daughter are agreeable.  -Pending MRI  -Neurology following along  -Discussed with neurology, recommended blood pressure target systolic between 1 20-1 60 and also recommended continue NIH stroke scale monitoring  -Stroke order set placed in     #Acute hypoxemic respiratory failure  #COPD  Extubated on 6/13, currently on 2L NC   Maintain SaO2 > 92%  Continue as needed neb treatment    #ESRD  HD Tues/Thurs/Sat  Will need eventual permcath exchange this week     #Nonfunctioning right permacath  #Right [place] IJ cath  Discussed with nephrology planning to exchange to tunneled catheter  IR on board    #HTN  On metoprolol  Off nicardipine gtt, PRN IV labetalol for SBP > 180     New onset Afib with RVR  - cardiology following  - stable now on amiodarone gtt and metoprolol  -Unable to anticoagulate because of GI  bleeding    #Renal cell carcinoma  #Urothelial carcinoma  #Thrombocytopenia  - VTE ppx: SCDs, subcutaneous heparin     #Diabetes Mellitus type 2  -Sliding scale insulin          Nutrition: NPO, tube feeds  Malnutrition Documentation    Moderate Malnutrition related to inadequate nutritional intake in the setting of acute illness as evidenced by <50% EER > 5 days, mild muscle losses (temporalis, deltoid) - new                 Patient has BMI=Body mass index is 35.28 kg/m.  Diagnosis: Obesity based on BMI criteria                           Recent Labs   Lab 07/13/22  0348 07/12/22  0418 07/11/22  0415   Hemoglobin 8.0* 8.5* 9.4*   Hematocrit 24.1* 24.8* 27.3*   MCV 91.3 91.2 90.1   WBC 15.12* 17.81* 20.96*   Platelet Count 249 282 267     Recent Labs   Lab 06/02/22  0514   Iron 62   TIBC 145*   Iron Saturation 43       Code Status: Full Code    Dispo: pending improved mental status    Family Contact: none    DVT Prophylaxis:   Current Facility-Administered Medications (Includes Only Anticoagulants, Misc. Hematological)   Medication Dose Route Last Admin    albumin human 25 % bag 25 g  100 mL Intravenous 25 g at 07/13/22 1540          CHART  REVIEW & DISCUSSION     The following chart items were reviewed as of 5:09 PM on 07/13/22:  [x]  Lab Results [x]  Imaging Results   [x]  Problem List  [x]  Current Orders [x]  Current Medications  [x]  Allergies  []  Code Status []  Previous Notes   []  SDoH    The management and plan of care for this patient was discussed with the following specialty consultants:  []  Cardiology  []  Gastroenterology                 []  Infectious Disease  []  Pulmonology []  Neurology                [x]  Nephrology  []  Neurosurgery []  Orthopedic Surgery  []  Heme/Onc  []  General Surgery []  Psychiatry                                   []  Palliative    SUBJECTIVE     [name] evaluated at the bedside delirious yelling  Able to say hi back to me  But not able to follow command  Unable to get review of  system    MEDICATIONS     Current Facility-Administered Medications   Medication Dose Route Frequency    acetaminophen  650 mg Oral 4 times per day  albuterol-ipratropium  3 mL Nebulization Q6H WA    aspirin  81 mg per NG tube Daily    glycopyrrolate  0.2 mg Intravenous Q8H    insulin glargine  7 Units Subcutaneous QAM    insulin lispro  2-10 Units Subcutaneous Q4H SCH    meropenem  500 mg Intravenous Q24H    metoprolol tartrate  12.5 mg per NG tube Q12H [facility]    micafungin  100 mg Intravenous Q24H    pantoprazole  40 mg Intravenous BID    sevelamer carbonate  0.8 g Oral TID MEALS    thiamine  100 mg Intravenous Q24H SCH       PHYSICAL EXAM     Vitals:    07/13/22 1700   BP: 136/63   Pulse: 85   Resp:    Temp:    SpO2:        Temperature: Temp  Min: 98.5 F (36.9 C)  Max: 100.6 F (38.1 C)  Pulse: Pulse  Min: 78  Max: 100  Respiratory: Resp  Min: 15  Max: 36  Non-Invasive BP: BP  Min: 75/40  Max: 169/72  Pulse Oximetry SpO2  Min: 95 %  Max: 100 %    Intake and Output Summary (Last 24 hours) at Date Time    Intake/Output Summary (Last 24 hours) at 07/13/2022 1709  Last data filed at 07/13/2022 1130  Gross per 24 hour   Intake 1518.7 ml   Output --   Net 1518.7 ml         GEN APPEARANCE: Normal;  A&OX0  HEENT: PERLA; EOMI; Conjunctiva Clear  NECK: Supple; No bruits  CVS: RRR, S1, S2; No M/G/R  LUNGS: CTAB; wheezing noted, no Rhonchi: No rales  ABD: Soft; No TTP; + Normoactive BS  EXT: No edema; Pulses 2+ and intact  NEURO: moving right upper extremity spontaneously, able to move lower extremities for pain but minimal, no movement at all on the left upper extremity  MENTAL STATUS: Alert oriented x 0, unable to get detailed exam given mental status    LABS     Recent Labs   Lab 07/13/22  0348 07/12/22  0418 07/11/22  0415   WBC 15.12* 17.81* 20.96*   RBC 2.64* 2.72* 3.03*   Hemoglobin 8.0* 8.5* 9.4*   Hematocrit 24.1* 24.8* 27.3*   MCV 91.3 91.2 90.1   Platelet Count 249 282 267       Recent Labs   Lab 07/13/22  0348  07/12/22  0418 07/11/22  0415 07/10/22  0136 07/09/22  0207   Sodium 138 138 139 141 141   Potassium 3.5 3.9 3.6 3.6 4.2   Chloride 100 101 103 103 106   CO2 23 21 21 23 20    BUN 45* 77* 58* 41* 97*   Creatinine 4.1* 5.9* 4.9* 3.6* 6.0*   Glucose 182* 150* 156* 124* 181*   Calcium 8.0 8.4 8.4 8.0 8.5   Magnesium 1.8 2.0 2.0 1.9 2.2       Recent Labs   Lab 07/07/22  2120   ALT 15   AST (SGOT) 23   Bilirubin, Total 0.1*   Albumin 1.4*   Alkaline Phosphatase 69                     Microbiology Results (last 15 days)       Procedure Component Value Units Date/Time    Culture, Blood, Aerobic And Anaerobic [[id number]] Collected: 07/07/22 0355    Order  Status: Completed Specimen: Blood, Venous Updated: 07/12/22 0700     Culture Blood No growth at 5 days    Culture, Blood, Aerobic And Anaerobic [[id number]] Collected: 07/06/22 1837    Order Status: Completed Specimen: Blood, Venous Updated: 07/12/22 0000     Culture Blood No growth at 5 days    Culture, Sputum and Lower Respiratory [[id number]]     Order Status: Sent Specimen: Sputum, Induced     Culture, Sputum and Lower Respiratory [[id number]]  (Abnormal) Collected: 07/04/22 1034    Order Status: Completed Specimen: Sputum, Suctioned Updated: 07/12/22 1756     Culture Respiratory Heavy growth of Mixed upper respiratory flora      Light growth of Gram negative rod     Comment: Questionable significance due to low quantity.  No further work.        Gram Stain Few WBCs      Rare Squamous epithelial cells      Few Mixed respiratory flora    Rectal Swab Carbapenemase Surveillance, PCR [[id number]]  (Normal) Collected: 06/29/22 1842    Order Status: Completed Specimen: Swab from Rectum Updated: 06/30/22 0150     IMP gene sequence Not Detected     VIM gene sequence Not Detected     NDM gene sequence Not Detected     KPC gene sequence Not Detected     OXA-48 gene sequence Not Detected    Narrative:      This test utilizes real-time PCR for the detection and differentiation of gene  sequences associated with carbapenem-non-susceptibility in Enterobacterales (Enterobacteriaceae), Pseudomonas aeruginosa and Acinetobacter baumannii. Targets include blaKPC, blaNDM, blaVIM, blaOXA-48 (and blaOXA-181), and blaIMP (excluding IMP-7, IMP-13, IMP-14). Detection of these gene sequences does not indicate the presence of viable organisms. This assay does not report variants of these gene sequences. Mutations or polymorphisms may affect detection of current, new, or unknown variants, resulting in a false negative result. This test is for infection prevention surveillance purposes only and should not be used to make treatment decisions. The performance of the Xpert Carba-R Assay has not been evaluated with rectal swab specimens from pediatric patients.             RADIOLOGY     CT Head WO Contrast    Result Date: 07/13/2022   1.New region of loss of gray-white matter differentiation in the right frontal lobe, concerning for acute infarction. 2.A few additional areas of poor gray-white matter differentiation for example in the left occipital lobe may be artifactual but could be correlated with MRI. 3.No evidence of acute intracranial hemorrhage or significant mass effect. These urgent results were discussed with and acknowledged by Dr. Don Broach on 07/13/2022 3:20 PM. Vara Guardian, MD 07/13/2022 3:21 PM    XR Chest AP Portable    Result Date: 07/10/2022  Interstitial markings have slightly increased, suggesting minimal edema. No other change. Wilmon Pali, MD 07/10/2022 4:49 PM    XR Chest AP Portable    Result Date: 07/07/2022   There is mild interstitial prominence suggesting mild congestive heart failure/fluid overload in the setting of shortness of breath [name], MD 07/07/2022 12:32 PM    XR Chest AP Portable    Result Date: 07/06/2022   Hypoventilation with basilar atelectasis. Charlott Rakes, MD 07/06/2022 3:35 PM    CT Abdomen Pelvis W IV And PO Cont    Result Date: 07/06/2022  1.Postoperative changes from  bilateral nephrectomy and cystectomy. 2.Small amount of ascites including some areas which appear loculated. Small collections  of fluid and gas in the anterior midline abdomen may be postoperative but superimposed infection is not excluded. No rim-enhancing fluid collections or extravasation of oral contrast to suggest anastomotic leak. Demetrios Isaacs, MD 07/06/2022 5:51 AM    CT Head WO Contrast    Result Date: 07/06/2022   1.No acute intracranial abnormality is seen. Leandro Reasoner, MD 07/06/2022 3:56 AM    XR Abdomen AP    Result Date: 07/05/2022  Enteric tube terminates in the distal stomach. Elease Etienne, DO 07/05/2022 12:24 PM    XR Chest AP Portable    Result Date: 06/30/2022  1. Lines and tubes in adequate position. No pneumothorax. 2. Trace left pleural effusion and bibasilar atelectasis. Jasmine December D'Heureux, MD 06/30/2022 4:53 PM    XR Chest AP Portable    Result Date: 06/30/2022  Catheters as described. Pankaj Dominica, MD 06/30/2022 2:07 PM    XR Chest AP Portable    Result Date: 06/28/2022   Right mainstem bronchial intubation. The tube should be withdrawn about 4 cm. Dr. Tildon Husky is aware. Wynema Birch, MD 06/28/2022 4:56 PM    XR OR MISCOUNT    Result Date: 06/28/2022   No unexpected foreign body. These critical results were discussed with and acknowledged by Angie R., the circulating nurse, who will communicate the findings to Everardo All, MD on 06/28/2022 3:02 PM. Bosie Helper, MD 06/28/2022 3:03 PM   Echo Results       None          No results found for this or any previous visit.    Signed,  Gayleen Orem, MD  5:09 PM 07/13/2022

## 2022-07-13 NOTE — Progress Notes (Signed)
[facility] PROGRESS NOTE      Date of Note: 07/13/2022   Patient Name: Shelby Barron     Date of Birth:  [birth year]     MRN:               [id number]     PCP:               Patsy Lager, MD     I am available on epic chart and [employer] link extension [phone] Monday - Friday 8:30-16:30. If urology consultation is needed outside these hours please contact [facility] at [phone].      ASSESSMENT/  PLAN     -medical management per cardiology, GI, ID, nephrology, medicine, neurology. No additional urology recs.     I will discuss/discussed the plan of care with the following services:  Dr. Frederica Kuster, Urology     I spent a total of 10 minutes involved in this patient's care    Sheldon Silvan, PA  07/13/2022, 1:45 PM    SUBJECTIVE     Interval History: 6/10 radical cystectomy and bilateral nephrectomy with enterotomy 6/12 abdominal wound closure, bowel anastamosis. 6/18 CT no indicative of abscess or leak.     WBC trending down but Tmax 100.66F. getting head imaging for continued AMS     Patient Active Problem List    Diagnosis Date Noted    Anemia 06/30/2022    COPD (chronic obstructive pulmonary disease) 06/30/2022    Bladder cancer 06/28/2022    Malignant neoplasm of urinary bladder, unspecified site 06/25/2022    Malignant tumor of kidney, left 06/25/2022    GERD (gastroesophageal reflux disease) 06/25/2022    Leukocytosis 06/25/2022    Chronic diastolic congestive heart failure 06/23/2022    Aortic atherosclerosis 06/23/2022    Peritonitis 05/26/2022    ESRD (end stage renal disease) 05/26/2022    Hamstring injury 05/10/2013    Benign essential hypertension 02/10/2013    Left renal mass 11/28/2012    Type 2 diabetes mellitus with kidney complication, with long-term current use of insulin 07/31/2010    Diabetes mellitus type II, uncontrolled 07/31/2010    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   Respiratory: Normal rate. No retractions or increased work of breathing.   Abdomen: non-distended, incisions CDI, wound vac in situ    LABS & IMAGING     CBC  Recent Labs     07/13/22  0348 07/12/22  0418 07/11/22  0415   WBC 15.12* 17.81* 20.96*   RBC 2.64* 2.72* 3.03*   Hematocrit 24.1* 24.8* 27.3*   MCV 91.3 91.2 90.1   MCH 30.3 31.3 31.0   MCHC 33.2 34.3 34.4   RDW 16* 17* 16*   MPV 11.3 11.3 11.1       BMP  Recent Labs     07/13/22  0348 07/12/22  0418 07/11/22  0415   CO2 23 21 21    Anion Gap 15.0 16.0* 15.0   BUN 45* 77* 58*   Creatinine 4.1* 5.9* 4.9*       INR  Lab Results   Component Value Date/Time    INR 1.1 07/01/2022 11:20 AM    INR 1.1 06/01/2022 03:15 AM         IMAGING:  XR Chest AP Portable   Final Result      Interstitial markings have slightly increased, suggesting minimal edema. No   other change.      Wilmon Pali, MD   07/10/2022 4:49 PM      XR Chest AP Portable   Final Result    There is mild interstitial prominence suggesting mild   congestive heart failure/fluid overload in the setting of shortness of   breath      [name], MD   07/07/2022 12:32 PM      XR Chest AP Portable   Final Result       Hypoventilation with basilar atelectasis.      Charlott Rakes, MD   07/06/2022 3:35 PM      CT Abdomen Pelvis W IV And PO Cont   Final Result      1.Postoperative changes from bilateral nephrectomy and cystectomy.    2.Small amount of ascites including some areas which appear loculated.   Small collections of fluid and gas in the anterior midline abdomen may be   postoperative but superimposed infection is not excluded. No rim-enhancing   fluid  collections or extravasation of oral contrast to suggest anastomotic   leak.      Demetrios Isaacs, MD   07/06/2022 5:51 AM      CT Head WO Contrast   Final Result       1.No acute intracranial abnormality is seen.      Leandro Reasoner, MD   07/06/2022 3:56 AM      XR Abdomen AP   Final Result      Enteric tube terminates in the distal stomach.      Elease Etienne, DO   07/05/2022 12:24 PM      XR Chest AP Portable   Final Result         1. Lines and tubes in adequate position. No pneumothorax.   2. Trace left pleural effusion and bibasilar atelectasis.      Jasmine December D'Heureux, MD   06/30/2022 4:53 PM      XR Chest AP Portable   Final Result      Catheters as described.      Pankaj Dominica, MD   06/30/2022 2:07 PM      XR Chest AP Portable   Final Result    Right mainstem bronchial intubation. The tube should be   withdrawn about 4 cm.      Dr. Tildon Husky is aware.      Wynema Birch, MD   06/28/2022 4:56 PM      XR OR MISCOUNT   Final Result    No unexpected foreign body.      These critical results were discussed with and acknowledged by Angie R.,   the circulating nurse, who will communicate the findings to Everardo All,   MD on 06/28/2022 3:02 PM.         Bosie Helper, MD   06/28/2022 3:03 PM      CT Head WO Contrast    (  Results Pending)

## 2022-07-14 ENCOUNTER — Encounter
Admission: RE | Disposition: A | Discharge status: Skilled Nursing Facility | Payer: Self-pay | Source: Home / Self Care | Attending: Student in an Organized Health Care Education/Training Program

## 2022-07-14 HISTORY — PX: TUNNELED CATH PLACEMENT (PERMCATH): IMG2664

## 2022-07-14 HISTORY — PX: TUNNELED CATH REMOVAL (PERMCATH): IMG2665

## 2022-07-14 LAB — ECG 12-LEAD
Atrial Rate: 87 {beats}/min
P Axis: -8 degrees
P-R Interval: 152 ms
Q-T Interval: 396 ms
QRS Duration: 82 ms
QTC Calculation (Bezet): 476 ms
R Axis: 25 degrees
T Axis: 58 degrees
Ventricular Rate: 87 {beats}/min

## 2022-07-14 LAB — WHOLE BLOOD GLUCOSE POCT
Whole Blood Glucose POCT: 111 mg/dL — ABNORMAL HIGH (ref 70–100)
Whole Blood Glucose POCT: 112 mg/dL — ABNORMAL HIGH (ref 70–100)
Whole Blood Glucose POCT: 128 mg/dL — ABNORMAL HIGH (ref 70–100)
Whole Blood Glucose POCT: 146 mg/dL — ABNORMAL HIGH (ref 70–100)
Whole Blood Glucose POCT: 148 mg/dL — ABNORMAL HIGH (ref 70–100)
Whole Blood Glucose POCT: 158 mg/dL — ABNORMAL HIGH (ref 70–100)
Whole Blood Glucose POCT: 91 mg/dL (ref 70–100)

## 2022-07-14 LAB — COMPREHENSIVE METABOLIC PANEL
ALT: 11 U/L (ref 0–55)
AST (SGOT): 18 U/L (ref 5–41)
Albumin/Globulin Ratio: 0.6 — ABNORMAL LOW (ref 0.9–2.2)
Albumin: 1.8 g/dL — ABNORMAL LOW (ref 3.5–5.0)
Alkaline Phosphatase: 85 U/L (ref 37–117)
Anion Gap: 11 (ref 5.0–15.0)
BUN: 24 mg/dL — ABNORMAL HIGH (ref 7–21)
Bilirubin, Total: 0.2 mg/dL (ref 0.2–1.2)
CO2: 27 mEq/L (ref 17–29)
Calcium: 8.4 mg/dL (ref 7.9–10.2)
Chloride: 100 mEq/L (ref 99–111)
Creatinine: 2.4 mg/dL — ABNORMAL HIGH (ref 0.4–1.0)
GFR: 19.3 mL/min/{1.73_m2} — ABNORMAL LOW (ref >=60.0)
Globulin: 2.8 g/dL (ref 2.0–3.6)
Glucose: 116 mg/dL — ABNORMAL HIGH (ref 70–100)
Potassium: 3.4 mEq/L — ABNORMAL LOW (ref 3.5–5.3)
Protein, Total: 4.6 g/dL — ABNORMAL LOW (ref 6.0–8.3)
Sodium: 138 mEq/L (ref 135–145)

## 2022-07-14 LAB — LAB USE ONLY - CBC WITH DIFFERENTIAL
Absolute Basophils: 0.04 10*3/uL (ref 0.00–0.08)
Absolute Eosinophils: 0.05 10*3/uL (ref 0.00–0.44)
Absolute Immature Granulocytes: 0.23 10*3/uL — ABNORMAL HIGH (ref 0.00–0.07)
Absolute Lymphocytes: 1.12 10*3/uL (ref 0.42–3.22)
Absolute Monocytes: 1.02 10*3/uL — ABNORMAL HIGH (ref 0.21–0.85)
Absolute Neutrophils: 9.82 10*3/uL — ABNORMAL HIGH (ref 1.10–6.33)
Absolute nRBC: 0 10*3/uL (ref <=0.00)
Basophils %: 0.3 %
Eosinophils %: 0.4 %
Hematocrit: 23.5 % — ABNORMAL LOW (ref 34.7–43.7)
Hemoglobin: 7.7 g/dL — ABNORMAL LOW (ref 11.4–14.8)
Immature Granulocytes %: 1.9 %
Lymphocytes %: 9.1 %
MCH: 30.1 pg (ref 25.1–33.5)
MCHC: 32.8 g/dL (ref 31.5–35.8)
MCV: 91.8 fL (ref 78.0–96.0)
MPV: 10.9 fL (ref 8.9–12.5)
Monocytes %: 8.3 %
Neutrophils %: 80 %
Platelet Count: 224 10*3/uL (ref 142–346)
Preliminary Absolute Neutrophil Count: 9.82 10*3/uL — ABNORMAL HIGH (ref 1.10–6.33)
RBC: 2.56 10*6/uL — ABNORMAL LOW (ref 3.90–5.10)
RDW: 16 % — ABNORMAL HIGH (ref 11–15)
WBC: 12.28 10*3/uL — ABNORMAL HIGH (ref 3.10–9.50)
nRBC %: 0 /100 WBC (ref <=0.0)

## 2022-07-14 LAB — LIPID PANEL
Cholesterol / HDL Ratio: 4 Index
Cholesterol: 80 mg/dL (ref <=199)
HDL: 20 mg/dL — ABNORMAL LOW (ref >=40)
LDL Calculated: 23 mg/dL (ref 0–129)
Triglycerides: 185 mg/dL — ABNORMAL HIGH (ref 34–149)
VLDL Calculated: 37 mg/dL (ref 10–40)

## 2022-07-14 LAB — SURGICAL PATHOLOGY

## 2022-07-14 LAB — PT AND APTT
INR: 1.1 (ref 0.9–1.1)
PT: 12.4 s (ref 10.1–12.9)
PTT: 28 s (ref 27–39)

## 2022-07-14 LAB — HEMOGLOBIN A1C
Average Estimated Glucose: 114 mg/dL
Hemoglobin A1C: 5.6 % (ref 4.6–5.6)

## 2022-07-14 LAB — MAGNESIUM: Magnesium: 1.8 mg/dL (ref 1.6–2.6)

## 2022-07-14 LAB — PHOSPHORUS: Phosphorus: 2.9 mg/dL (ref 2.3–4.7)

## 2022-07-14 LAB — THYROID STIMULATING HORMONE (TSH) WITH REFLEX TO FREE T4: TSH: 1.52 u[IU]/mL (ref 0.35–4.94)

## 2022-07-14 SURGERY — TUNNELED CATH REMOVAL
Anesthesia: Local | Site: Neck | Laterality: Right

## 2022-07-14 SURGERY — TUNNELED CATH PLACEMENT
Anesthesia: Conscious Sedation | Site: Neck | Laterality: Right

## 2022-07-14 SURGERY — TUNNELED CATH PLACEMENT
Site: Chest

## 2022-07-14 MED ORDER — DIPHENHYDRAMINE HCL 50 MG/ML IJ SOLN
25.0000 mg | Freq: Once | INTRAMUSCULAR | Status: DC | PRN
Start: 2022-07-14 — End: 2022-07-18

## 2022-07-14 MED ORDER — LIDOCAINE-EPINEPHRINE 2 %-1:200000 IJ SOLN
INTRAMUSCULAR | Status: AC
Start: 2022-07-14 — End: ?
  Filled 2022-07-14: qty 20

## 2022-07-14 MED ORDER — LIDOCAINE HCL 1 % IJ SOLN
INTRAMUSCULAR | Status: AC
Start: 2022-07-14 — End: ?
  Filled 2022-07-14: qty 10

## 2022-07-14 MED ORDER — NALOXONE HCL 0.4 MG/ML IJ SOLN (WRAP)
0.1000 mg | INTRAMUSCULAR | Status: AC | PRN
Start: 2022-07-14 — End: 2022-07-14

## 2022-07-14 MED ORDER — ONDANSETRON HCL 4 MG/2ML IJ SOLN
4.0000 mg | Freq: Once | INTRAMUSCULAR | Status: DC
Start: 2022-07-14 — End: 2022-07-18

## 2022-07-14 MED ORDER — MIDAZOLAM HCL 1 MG/ML IJ SOLN (WRAP)
0.5000 mg | INTRAMUSCULAR | Status: AC | PRN
Start: 2022-07-14 — End: 2022-07-14
  Administered 2022-07-14 (×2): 0.5 mg via INTRAVENOUS

## 2022-07-14 MED ORDER — FENTANYL CITRATE (PF) 50 MCG/ML IJ SOLN (WRAP)
INTRAMUSCULAR | Status: AC
Start: 2022-07-14 — End: ?
  Filled 2022-07-14: qty 2

## 2022-07-14 MED ORDER — MIDAZOLAM HCL 1 MG/ML IJ SOLN (WRAP)
INTRAMUSCULAR | Status: AC
Start: 2022-07-14 — End: ?
  Filled 2022-07-14: qty 2

## 2022-07-14 MED ORDER — FENTANYL CITRATE (PF) 50 MCG/ML IJ SOLN (WRAP)
25.0000 ug | INTRAMUSCULAR | Status: AC | PRN
Start: 2022-07-14 — End: 2022-07-14
  Administered 2022-07-14 (×2): 25 ug via INTRAVENOUS

## 2022-07-14 MED ORDER — LIDOCAINE-EPINEPHRINE 1 %-1:100000 IJ SOLN
INTRAMUSCULAR | Status: AC | PRN
Start: 2022-07-14 — End: 2022-07-14
  Administered 2022-07-14: 9 mL via INTRADERMAL

## 2022-07-14 MED ORDER — LIDOCAINE HCL 1 % IJ SOLN
INTRAMUSCULAR | Status: AC | PRN
Start: 2022-07-14 — End: 2022-07-14
  Administered 2022-07-14: 10 mL

## 2022-07-14 MED ORDER — FLUMAZENIL 0.5 MG/5ML IV SOLN
0.2000 mg | INTRAVENOUS | Status: AC | PRN
Start: 2022-07-14 — End: 2022-07-14

## 2022-07-14 SURGICAL SUPPLY — 4 items
CATHETER HEMODIALYSIS OD14.5 FR L24 CM (Catheter) ×1 IMPLANT
CATHETER HEMODIALYSIS OD14.5 FR L24 CM EQUISTREAM L19 CM STRAIGHT (Catheter) IMPLANT
GUIDEWIRE VASCULAR OD.035 IN L180 CM L3 (Guidewire) ×1 IMPLANT
GUIDEWIRE VASCULAR OD.035 IN L180 CM L3 CM GLIDEWIRE STANDARD ANGLE (Guidewire) IMPLANT

## 2022-07-14 NOTE — SLP Eval Note (Signed)
[facility]    Speech Therapy Treatment Note Attempt   Patient: Shelby Barron MRN#: [id number]   Unit: [facility] Room/Bed: A2713/A2713-01       Attempted to see patient at approximately 1608.        SLP Order Cancellation Reason: Not clinically indicated at this time (comment required) - Rehab decision .  Pt s/p tunneled dialysis catheter removal and replacement.  RN cleared pt for therapy since per nurse pt no longer NPO.  Pt encountered lethargic with moaning, poorly sustained level of alertness and inconsistent and variably intelligible verbalizations.  Long time friend at b/s concerned about appropriateness for swallow eval - "she's out of it".  SLP in agreement d/t poor alertness levels ? Pain s/p procedure.  Swallow eval will be deferred until a more appropriate time.  RN handoff completed.    Will attempt again as schedule permits. Will continue to monitor for appropriateness.       07/14/2022   Gerri Lins, MS, CCC-SLP  [phone]

## 2022-07-14 NOTE — Progress Notes (Signed)
[facility]  PROGRESS NOTE      Patient: Shelby Barron  Date:07/08/2022   LOS: 16 Days  Admission Date: 06/28/2022   MRN: [id number]  Attending: Gayleen Orem, MD  When on service as the attending, please contact me on Epic Secure Chat from 7 AM - 7 PM for non-urgent issues. For urgent matters use XTend page from 7 AM - 7 PM.       ASSESSMENT/PLAN     Shelby Barron is a 82 y.o. female admitted with Bladder cancer    Interval Summary: [name], [name], type 2 diabetes, renal cell carcinoma status post partial bilateral nephrectomy, [name], new diagnosis of urothelial cancer initally presented on 6/10 for elective cystectomy and completion nephrectomy, [name], [name], extubated and off pressors since 6/13.     She is hypoactive and delirious. Surgery recommended NGT and CTAP today. Plan to start TF today. CTH pending.  Has [name] T/Th/Sat. Nephrology following     Developed new onset afib RVR today during HD. Stopped norvasc switched to metoprolol and gave additional IV metoprolol. Consulted cardiology. Discussed with ICU requested a consult given deterioration. Recommended repeat blood cultures and broadening antibiotics to meropenem. Palliative care consulted.    Rising WBC. Blood cultures NGTD. Patient tachypneic CXR with pulm edema. Discussed with nephrology giving 3 mg IV bumex. Had dark emesis. Vitals stable. TFs held, started IV PPI, follow AM labs. Discussed with GI and surgery.    No further episodes of Afib RVR now on amiodarone gtt and PO metoprolol. Hgb stable. Repeating CXR, clear. Started robinul for upper airway secretions/rattling. Clinically improving.    New permacath placement planned for this week. Then hopefully NGT can come out if patient tolerating PO and discharge planning.    Patient Active Hospital Problem List:  #S/P Cystectomy and completion  nephrectomy complicated by small bowel enterotomy   #Mixed shock - hemorrhagic vs septic: Resolved  - 6/10 small bowel enteretomy with 1L EBL. Went to the OR on June 12 for small bowel anastomosis, closure of mesenteric defect, closure of abdominal wall with wound VAC application  - Failed SLP eval . Wcont tube feeds per nutrition recs  -Wound VAC changed 6/15  - Surgery consulted, recommend CTAP with PO contrast to eval for intra-abdominal source of infection given uptrending WBC. Surgery ordered pre-medication with steroid/benadryl given h/o contrast allergy, showed no leak or enhancing fluid collection  -Continue meropenem/micafungin per ID recommendation  -Follow-up with general surgery and ID  -Continue tube feeding  -WBC is trending down    #Acute CVA on the right frontal lobe  #History of CVA  #Encephalopathy/delirium  - Hold home plavix  - Adhere to day/night cycles and administer constant reorientation.   -Left upper extremity not moving at all unclear duration  -CT head showed right frontal infarction, neurology consulted recommended to start aspirin, discussed with GI and GI cleared, discussed with daughter and daughter are agreeable.  -Pending MRI  -Neurology following along  -Discussed with neurology, recommended blood pressure target systolic between 1 20-1 60 and also recommended continue NIH stroke scale monitoring  -Stroke order set placed in  -Pending carotid Doppler and MRA of the head   -Will discuss benefits risk regarding anticoagulation      #Acute hypoxemic respiratory failure  #COPD  Extubated on 6/13, currently on  2L NC   Maintain SaO2 > 92%  Continue as needed neb treatment    #ESRD  HD Tues/Thurs/Sat  Will need eventual permcath exchange this week     #Nonfunctioning right permacath  #Right [place] IJ cath  Discussed with nephrology planning to exchange to tunneled catheter  IR on board, scheduled for IR exchange on 6/26    #HTN  On metoprolol  Off nicardipine gtt, PRN IV labetalol for  SBP > 180     New onset Afib with RVR  - cardiology following  - stable now on amiodarone gtt and metoprolol  -Unable to anticoagulate because of GI bleeding  -Anticoagulation once GI clears    #Renal cell carcinoma  #Urothelial carcinoma  #Thrombocytopenia  - VTE ppx: SCDs, subcutaneous heparin     #Diabetes Mellitus type 2  -Sliding scale insulin          Nutrition: NPO, tube feeds  Malnutrition Documentation    Moderate Malnutrition related to inadequate nutritional intake in the setting of acute illness as evidenced by <50% EER > 5 days, mild muscle losses (temporalis, deltoid) - new                 Patient has BMI=Body mass index is 33.87 kg/m.  Diagnosis: Obesity based on BMI criteria                                Recent Labs   Lab 07/14/22  0435 07/13/22  0348 07/12/22  0418   Hemoglobin 7.7* 8.0* 8.5*   Hematocrit 23.5* 24.1* 24.8*   MCV 91.8 91.3 91.2   WBC 12.28* 15.12* 17.81*   Platelet Count 224 249 282     Recent Labs   Lab 06/02/22  0514   Iron 62   TIBC 145*   Iron Saturation 43       Code Status: Full Code    Dispo: pending improved mental status    Family Contact: Updated daughter    DVT Prophylaxis:   Current Facility-Administered Medications (Includes Only Anticoagulants, Misc. Hematological)   Medication Dose Route Last Admin   None          CHART  REVIEW & DISCUSSION     The following chart items were reviewed as of 3:52 PM on 07/14/22:  [x]  Lab Results [x]  Imaging Results   [x]  Problem List  [x]  Current Orders [x]  Current Medications  [x]  Allergies  []  Code Status []  Previous Notes   []  SDoH    The management and plan of care for this patient was discussed with the following specialty consultants:  []  Cardiology  []  Gastroenterology                 []  Infectious Disease  []  Pulmonology []  Neurology                [x]  Nephrology  []  Neurosurgery []  Orthopedic Surgery  []  Heme/Onc  []  General Surgery []  Psychiatry                                   []  Palliative    SUBJECTIVE     Shelby Barron  evaluated at the bedside delirious yelling  Per daughter mental status slightly improving daily, but still alert oriented x 0  But not able to follow command  Unable to get review of system  MEDICATIONS     Current Facility-Administered Medications   Medication Dose Route Frequency    acetaminophen  650 mg Oral 4 times per day    albuterol-ipratropium  3 mL Nebulization Q6H WA    aspirin  81 mg per NG tube Daily    glycopyrrolate  0.2 mg Intravenous Q8H    insulin glargine  7 Units Subcutaneous QAM    insulin lispro  2-10 Units Subcutaneous Q4H SCH    meropenem  500 mg Intravenous Q24H    metoprolol tartrate  12.5 mg per NG tube Q12H [facility]    micafungin  100 mg Intravenous Q24H    ondansetron  4 mg Intravenous Once    pantoprazole  40 mg Intravenous BID    sevelamer carbonate  0.8 g Oral TID MEALS    thiamine  100 mg Intravenous Q24H SCH       PHYSICAL EXAM     Vitals:    07/14/22 1415   BP: 160/63   Pulse: 66   Resp: 15   Temp:    SpO2: 100%       Temperature: Temp  Min: 97.4 F (36.3 C)  Max: 98.4 F (36.9 C)  Pulse: Pulse  Min: 63  Max: 87  Respiratory: Resp  Min: 14  Max: 36  Non-Invasive BP: BP  Min: 108/54  Max: 188/84  Pulse Oximetry SpO2  Min: 96 %  Max: 100 %    Intake and Output Summary (Last 24 hours) at Date Time    Intake/Output Summary (Last 24 hours) at 07/14/2022 1552  Last data filed at 07/14/2022 1100  Gross per 24 hour   Intake 739.5 ml   Output 2000 ml   Net -1260.5 ml         GEN APPEARANCE: Normal;  A&OX0  HEENT: PERLA; EOMI; Conjunctiva Clear  NECK: Supple; No bruits  CVS: RRR, S1, S2; No M/G/R  LUNGS: CTAB; wheezing improved, no Rhonchi: No rales  ABD: Soft; No TTP; + Normoactive BS  EXT: No edema; Pulses 2+ and intact  NEURO: moving right upper extremity spontaneously, able to move lower extremities for pain but minimal, no movement at all on the left upper extremity  MENTAL STATUS: Alert oriented x 0, unable to get detailed exam given mental status    LABS     Recent Labs   Lab  07/14/22  0435 07/13/22  0348 07/12/22  0418   WBC 12.28* 15.12* 17.81*   RBC 2.56* 2.64* 2.72*   Hemoglobin 7.7* 8.0* 8.5*   Hematocrit 23.5* 24.1* 24.8*   MCV 91.8 91.3 91.2   Platelet Count 224 249 282       Recent Labs   Lab 07/14/22  0435 07/13/22  0348 07/12/22  0418 07/11/22  0415 07/10/22  0136   Sodium 138 138 138 139 141   Potassium 3.4* 3.5 3.9 3.6 3.6   Chloride 100 100 101 103 103   CO2 27 23 21 21 23    BUN 24* 45* 77* 58* 41*   Creatinine 2.4* 4.1* 5.9* 4.9* 3.6*   Glucose 116* 182* 150* 156* 124*   Calcium 8.4 8.0 8.4 8.4 8.0   Magnesium 1.8 1.8 2.0 2.0 1.9       Recent Labs   Lab 07/14/22  0435 07/07/22  2120   ALT 11 15   AST (SGOT) 18 23   Bilirubin, Total 0.2 0.1*   Albumin 1.8* 1.4*   Alkaline Phosphatase 85 69  Recent Labs   Lab 07/14/22  0435   INR 1.1   PT 12.4   PTT 28         Microbiology Results (last 15 days)       Procedure Component Value Units Date/Time    Culture, Blood, Aerobic And Anaerobic [[id number]] Collected: 07/07/22 0355    Order Status: Completed Specimen: Blood, Venous Updated: 07/12/22 0700     Culture Blood No growth at 5 days    Culture, Blood, Aerobic And Anaerobic [[id number]] Collected: 07/06/22 1837    Order Status: Completed Specimen: Blood, Venous Updated: 07/12/22 0000     Culture Blood No growth at 5 days    Culture, Sputum and Lower Respiratory [[id number]]     Order Status: Sent Specimen: Sputum, Induced     Culture, Sputum and Lower Respiratory [[id number]]  (Abnormal) Collected: 07/04/22 1034    Order Status: Completed Specimen: Sputum, Suctioned Updated: 07/12/22 1756     Culture Respiratory Heavy growth of Mixed upper respiratory flora      Light growth of Gram negative rod     Comment: Questionable significance due to low quantity.  No further work.        Gram Stain Few WBCs      Rare Squamous epithelial cells      Few Mixed respiratory flora    Fungal Identification, Molds [[id number]] Collected: 07/04/22 1034    Order Status: Sent Specimen: Sputum,  Suctioned Updated: 07/14/22 1251    Rectal Swab Carbapenemase Surveillance, PCR [[id number]]  (Normal) Collected: 06/29/22 1842    Order Status: Completed Specimen: Swab from Rectum Updated: 06/30/22 0150     IMP gene sequence Not Detected     VIM gene sequence Not Detected     NDM gene sequence Not Detected     KPC gene sequence Not Detected     OXA-48 gene sequence Not Detected    Narrative:      This test utilizes real-time PCR for the detection and differentiation of gene sequences associated with carbapenem-non-susceptibility in Enterobacterales (Enterobacteriaceae), Pseudomonas aeruginosa and Acinetobacter baumannii. Targets include blaKPC, blaNDM, blaVIM, blaOXA-48 (and blaOXA-181), and blaIMP (excluding IMP-7, IMP-13, IMP-14). Detection of these gene sequences does not indicate the presence of viable organisms. This assay does not report variants of these gene sequences. Mutations or polymorphisms may affect detection of current, new, or unknown variants, resulting in a false negative result. This test is for infection prevention surveillance purposes only and should not be used to make treatment decisions. The performance of the Xpert Carba-R Assay has not been evaluated with rectal swab specimens from pediatric patients.             RADIOLOGY     MRI Brain WO Contrast    Result Date: 07/13/2022   1.Patchy areas of acute/recent infarction involving the cerebral hemispheres bilaterally. The infarcts have a configuration suggesting watershed territory infarcts. 2.Chronic ischemic changes as discussed. Georgann Housekeeper, MD 07/13/2022 10:28 PM    CT Head WO Contrast    Result Date: 07/13/2022   1.New region of loss of gray-white matter differentiation in the right frontal lobe, concerning for acute infarction. 2.A few additional areas of poor gray-white matter differentiation for example in the left occipital lobe may be artifactual but could be correlated with MRI. 3.No evidence of acute intracranial hemorrhage or  significant mass effect. These urgent results were discussed with and acknowledged by Dr. Don Broach on 07/13/2022 3:20 PM. Vara Guardian, MD 07/13/2022 3:21 PM    XR Chest  AP Portable    Result Date: 07/10/2022  Interstitial markings have slightly increased, suggesting minimal edema. No other change. Wilmon Pali, MD 07/10/2022 4:49 PM    XR Chest AP Portable    Result Date: 07/07/2022   There is mild interstitial prominence suggesting mild congestive heart failure/fluid overload in the setting of shortness of breath [name], MD 07/07/2022 12:32 PM    XR Chest AP Portable    Result Date: 07/06/2022   Hypoventilation with basilar atelectasis. Charlott Rakes, MD 07/06/2022 3:35 PM    CT Abdomen Pelvis W IV And PO Cont    Result Date: 07/06/2022  1.Postoperative changes from bilateral nephrectomy and cystectomy. 2.Small amount of ascites including some areas which appear loculated. Small collections of fluid and gas in the anterior midline abdomen may be postoperative but superimposed infection is not excluded. No rim-enhancing fluid collections or extravasation of oral contrast to suggest anastomotic leak. Demetrios Isaacs, MD 07/06/2022 5:51 AM    CT Head WO Contrast    Result Date: 07/06/2022   1.No acute intracranial abnormality is seen. Leandro Reasoner, MD 07/06/2022 3:56 AM    XR Abdomen AP    Result Date: 07/05/2022  Enteric tube terminates in the distal stomach. Elease Etienne, DO 07/05/2022 12:24 PM    XR Chest AP Portable    Result Date: 06/30/2022  1. Lines and tubes in adequate position. No pneumothorax. 2. Trace left pleural effusion and bibasilar atelectasis. Jasmine December D'Heureux, MD 06/30/2022 4:53 PM    XR Chest AP Portable    Result Date: 06/30/2022  Catheters as described. Pankaj Dominica, MD 06/30/2022 2:07 PM    XR Chest AP Portable    Result Date: 06/28/2022   Right mainstem bronchial intubation. The tube should be withdrawn about 4 cm. Dr. Tildon Husky is aware. Wynema Birch, MD 06/28/2022 4:56 PM    XR OR MISCOUNT    Result Date:  06/28/2022   No unexpected foreign body. These critical results were discussed with and acknowledged by Angie R., the circulating nurse, who will communicate the findings to Everardo All, MD on 06/28/2022 3:02 PM. Bosie Helper, MD 06/28/2022 3:03 PM   Echo Results       None          No results found for this or any previous visit.    Signed,  Gayleen Orem, MD  3:52 PM 07/14/2022

## 2022-07-14 NOTE — Plan of Care (Signed)
Problem: Moderate/High Fall Risk Score >5  Goal: Patient will remain free of falls  07/14/2022 0603 by Clearnce Sorrel, RN  Outcome: Progressing  Flowsheets (Taken 07/13/2022 0730 by Theodoro Grist, [place], RN)  Moderate Risk (6-13):   MOD-Consider activation of bed alarm if appropriate   MOD-Apply bed exit alarm if patient is confused   MOD-Floor mat at bedside (where available) if appropriate   MOD-Consider a move closer to Nurses Station   MOD-Remain with patient during toileting   MOD-Place bedside commode and assistive devices out of sight when not in use   MOD-Re-orient confused patients  07/14/2022 0542 by Clearnce Sorrel, RN  Outcome: Progressing  Flowsheets (Taken 07/14/2022 0000)  High (Greater than 13):   HIGH-Consider use of low bed   HIGH-Initiate use of floor mats as appropriate   HIGH-Pharmacy to initiate evaluation and intervention per protocol   HIGH-Apply yellow "Fall Risk" arm band   HIGH-Utilize chair pad alarm for patient while in the chair   HIGH-Bed alarm on at all times while patient in bed   HIGH-Visual cue at entrance to patient's room   MOD-Place Fall Risk level on whiteboard in room   MOD-Include family in multidisciplinary POC discussions   MOD-Request PT/OT consult order for patients with gait/mobility impairment   MOD-Perform dangle, stand, walk (DSW) prior to mobilization   MOD-Utilize diversion activities   MOD-Re-orient confused patients   MOD-Use of assistive devices -Bedside Commode if appropriate   MOD-Remain with patient during toileting   MOD-Consider a move closer to Nurses Station   MOD-Use of chair-pad alarm when appropriate   LOW-Anticoagulation education for injury risk   LOW-Fall Interventions Appropriate for Low Fall Risk  07/14/2022 0541 by Clearnce Sorrel, RN  Outcome: Progressing     Problem: Compromised Sensory Perception  Goal: Sensory Perception Interventions  07/14/2022 0603 by Clearnce Sorrel, RN  Outcome: Progressing  Flowsheets (Taken 07/13/2022 2345)  Sensory Perception  Interventions: Offload heels, Pad bony prominences, Reposition q 2hrs/turn Clock, Q2 hour skin assessment under devices if present  07/14/2022 0542 by Clearnce Sorrel, RN  Outcome: Progressing  Flowsheets (Taken 07/13/2022 2345)  Sensory Perception Interventions: Offload heels, Pad bony prominences, Reposition q 2hrs/turn Clock, Q2 hour skin assessment under devices if present  07/14/2022 0541 by Clearnce Sorrel, RN  Outcome: Progressing     Problem: Compromised Moisture  Goal: Moisture level Interventions  07/14/2022 0603 by Clearnce Sorrel, RN  Outcome: Progressing  Flowsheets (Taken 07/13/2022 2345)  Moisture level Interventions: Moisture wicking products, Moisture barrier cream  07/14/2022 0542 by Clearnce Sorrel, RN  Outcome: Progressing  Flowsheets (Taken 07/13/2022 2345)  Moisture level Interventions: Moisture wicking products, Moisture barrier cream  07/14/2022 0541 by Clearnce Sorrel, RN  Outcome: Progressing     Problem: Compromised Activity/Mobility  Goal: Activity/Mobility Interventions  07/14/2022 0603 by Clearnce Sorrel, RN  Outcome: Progressing  Flowsheets (Taken 07/13/2022 2345)  Activity/Mobility Interventions: Pad bony prominences, TAP Seated positioning system when OOB, Promote PMP, Reposition q 2 hrs / turn clock, Offload heels  07/14/2022 0542 by Clearnce Sorrel, RN  Outcome: Progressing  Flowsheets (Taken 07/13/2022 2345)  Activity/Mobility Interventions: Pad bony prominences, TAP Seated positioning system when OOB, Promote PMP, Reposition q 2 hrs / turn clock, Offload heels  07/14/2022 0541 by Clearnce Sorrel, RN  Outcome: Progressing     Problem: Compromised Nutrition  Goal: Nutrition Interventions  07/14/2022 0603 by Clearnce Sorrel, RN  Outcome: Progressing  Flowsheets (Taken 07/13/2022 2345)  Nutrition Interventions: Discuss nutrition at Rounds, I&Os, Document % meal  eaten, Daily weights  07/14/2022 0542 by Clearnce Sorrel, RN  Outcome: Progressing  Flowsheets (Taken 07/13/2022 2345)  Nutrition Interventions:  Discuss nutrition at Rounds, I&Os, Document % meal eaten, Daily weights  07/14/2022 0541 by Clearnce Sorrel, RN  Outcome: Progressing     Problem: Compromised Friction/Shear  Goal: Friction and Shear Interventions  07/14/2022 0603 by Clearnce Sorrel, RN  Outcome: Progressing  Flowsheets (Taken 07/13/2022 2345)  Friction and Shear Interventions: Pad bony prominences, Off load heels, HOB 30 degrees or less unless contraindicated, Consider: TAP seated positioning, Heel foams  07/14/2022 0542 by Clearnce Sorrel, RN  Outcome: Progressing  Flowsheets (Taken 07/13/2022 2345)  Friction and Shear Interventions: Pad bony prominences, Off load heels, HOB 30 degrees or less unless contraindicated, Consider: TAP seated positioning, Heel foams  07/14/2022 0541 by Clearnce Sorrel, RN  Outcome: Progressing     Problem: SCIP  Goal: SCIP measures are followed  07/14/2022 0603 by Clearnce Sorrel, RN  Outcome: Progressing  Flowsheets (Taken 07/13/2022 2311 by Kerin Salen, RN)  SCIP measures are followed:   Administer antibiotics as ordered   VTE Prevention: Administer anticoagulant(s) and/or apply anti-embolism stockings/devices as ordered   Provide VTE prophylaxis within 24 hours of anesthesia end time (Note: SCD to lower extremities)  07/14/2022 0542 by Clearnce Sorrel, RN  Outcome: Progressing  Flowsheets (Taken 07/13/2022 2311 by Kerin Salen, RN)  SCIP measures are followed:   Administer antibiotics as ordered   VTE Prevention: Administer anticoagulant(s) and/or apply anti-embolism stockings/devices as ordered   Provide VTE prophylaxis within 24 hours of anesthesia end time (Note: SCD to lower extremities)  07/14/2022 0541 by Clearnce Sorrel, RN  Outcome: Progressing     Problem: Inadequate Airway Clearance  Goal: Patent Airway maintained  07/14/2022 0603 by Clearnce Sorrel, RN  Outcome: Progressing  Flowsheets (Taken 07/13/2022 2311 by Kerin Salen, RN)  Patent airway maintained:   Position patient for maximum ventilatory  efficiency   Provide adequate fluid intake to liquefy secretions   Suction secretions as needed   Reposition patient every 2 hours and as needed unless able to self-reposition  07/14/2022 0542 by Clearnce Sorrel, RN  Outcome: Progressing  Flowsheets (Taken 07/13/2022 2311 by Kerin Salen, RN)  Patent airway maintained:   Position patient for maximum ventilatory efficiency   Provide adequate fluid intake to liquefy secretions   Suction secretions as needed   Reposition patient every 2 hours and as needed unless able to self-reposition  07/14/2022 0541 by Clearnce Sorrel, RN  Outcome: Progressing  Goal: Normal respiratory rate/effort achieved/maintained  07/14/2022 0603 by Clearnce Sorrel, RN  Outcome: Progressing  Flowsheets (Taken 07/13/2022 0137 by Kerin Salen, RN)  Normal respiratory rate/effort achieved/maintained: Plan activities to conserve energy: plan rest periods  07/14/2022 0542 by Clearnce Sorrel, RN  Outcome: Progressing  Flowsheets (Taken 07/13/2022 0137 by Kerin Salen, RN)  Normal respiratory rate/effort achieved/maintained: Plan activities to conserve energy: plan rest periods  07/14/2022 0541 by Clearnce Sorrel, RN  Outcome: Progressing     Problem: Infection Prevention  Goal: Free from infection  07/14/2022 0603 by Clearnce Sorrel, RN  Outcome: Progressing  Flowsheets (Taken 07/13/2022 2311 by Kerin Salen, RN)  Free from infection:   Encourage/assist patient to turn, cough and perform deep breathing every 2 hours   Monitor/assess vital signs   Assess incision for evidence of healing   Monitor/assess output from surgical drain if present   Assess for signs and symptoms of infection   Monitor/assess lab values  and report abnormal values  07/14/2022 0542 by Clearnce Sorrel, RN  Outcome: Progressing  Flowsheets (Taken 07/13/2022 2311 by Kerin Salen, RN)  Free from infection:   Encourage/assist patient to turn, cough and perform deep breathing every 2 hours   Monitor/assess vital  signs   Assess incision for evidence of healing   Monitor/assess output from surgical drain if present   Assess for signs and symptoms of infection   Monitor/assess lab values and report abnormal values  07/14/2022 0541 by Clearnce Sorrel, RN  Outcome: Progressing     Problem: Impaired Mobility  Goal: Mobility/Activity is maintained at optimal level for patient  07/14/2022 0603 by Clearnce Sorrel, RN  Outcome: Progressing  Flowsheets (Taken 07/14/2022 0542)  Mobility/activity is maintained at optimal level for patient:   Encourage independent activity per ability   Consult/collaborate with Physical Therapy and/or Occupational Therapy  07/14/2022 0542 by Clearnce Sorrel, RN  Outcome: Progressing  Flowsheets (Taken 07/14/2022 0542)  Mobility/activity is maintained at optimal level for patient:   Encourage independent activity per ability   Consult/collaborate with Physical Therapy and/or Occupational Therapy  07/14/2022 0541 by Clearnce Sorrel, RN  Outcome: Progressing     Problem: Constipation  Goal: Fluid and electrolyte balance are achieved/maintained  07/14/2022 0603 by Clearnce Sorrel, RN  Outcome: Progressing  Flowsheets (Taken 07/13/2022 0137 by Kerin Salen, RN)  Fluid and electrolyte balance are achieved/maintained:   Assess and reassess fluid and electrolyte status   Monitor/assess lab values and report abnormal values   Observe for cardiac arrhythmias   Monitor for muscle weakness  07/14/2022 0542 by Clearnce Sorrel, RN  Outcome: Progressing  Flowsheets (Taken 07/13/2022 0137 by Kerin Salen, RN)  Fluid and electrolyte balance are achieved/maintained:   Assess and reassess fluid and electrolyte status   Monitor/assess lab values and report abnormal values   Observe for cardiac arrhythmias   Monitor for muscle weakness  07/14/2022 0541 by Clearnce Sorrel, RN  Outcome: Progressing  Goal: Elimination patterns are normal or improving  07/14/2022 0603 by Clearnce Sorrel, RN  Outcome: Progressing  Flowsheets (Taken  07/13/2022 0801 by Juel Burrow, RN)  Elimination patterns are normal or improving: Assess for and discuss C. diff screening with LIP  07/14/2022 0542 by Clearnce Sorrel, RN  Outcome: Progressing  Flowsheets (Taken 07/13/2022 0801 by Juel Burrow, RN)  Elimination patterns are normal or improving: Assess for and discuss C. diff screening with LIP  07/14/2022 0541 by Clearnce Sorrel, RN  Outcome: Progressing     Problem: Safety  Goal: Patient will be free from injury during hospitalization  07/14/2022 0603 by Clearnce Sorrel, RN  Outcome: Progressing  Flowsheets (Taken 07/13/2022 0137 by Kerin Salen, RN)  Patient will be free from injury during hospitalization:   Assess patient's risk for falls and implement fall prevention plan of care per policy   Provide and maintain safe environment   Ensure appropriate safety devices are available at the bedside   Hourly rounding   Assess for patients risk for elopement and implement Elopement Risk Plan per policy   Include patient/ family/ care giver in decisions related to safety   Use appropriate transfer methods  07/14/2022 0542 by Clearnce Sorrel, RN  Outcome: Progressing  Flowsheets (Taken 07/13/2022 0137 by Kerin Salen, RN)  Patient will be free from injury during hospitalization:   Assess patient's risk for falls and implement fall prevention plan of care per policy   Provide and maintain safe environment   Ensure appropriate safety  devices are available at the bedside   Hourly rounding   Assess for patients risk for elopement and implement Elopement Risk Plan per policy   Include patient/ family/ care giver in decisions related to safety   Use appropriate transfer methods  07/14/2022 0541 by Clearnce Sorrel, RN  Outcome: Progressing  Goal: Patient will be free from infection during hospitalization  07/14/2022 0603 by Clearnce Sorrel, RN  Outcome: Progressing  Flowsheets (Taken 07/12/2022 1102 by Luz Brazen, RN)  Free from Infection during hospitalization:   Assess and  monitor for signs and symptoms of infection   Monitor all insertion sites (i.e. indwelling lines, tubes, urinary catheters, and drains)   Encourage patient and family to use good hand hygiene technique   Monitor lab/diagnostic results  07/14/2022 0542 by Clearnce Sorrel, RN  Outcome: Progressing  Flowsheets (Taken 07/12/2022 1102 by Luz Brazen, RN)  Free from Infection during hospitalization:   Assess and monitor for signs and symptoms of infection   Monitor all insertion sites (i.e. indwelling lines, tubes, urinary catheters, and drains)   Encourage patient and family to use good hand hygiene technique   Monitor lab/diagnostic results  07/14/2022 0541 by Clearnce Sorrel, RN  Outcome: Progressing     Problem: Pain  Goal: Pain at adequate level as identified by patient  07/14/2022 0603 by Clearnce Sorrel, RN  Outcome: Progressing  Flowsheets (Taken 07/14/2022 0542)  Pain at adequate level as identified by patient:   Identify patient comfort function goal   Assess for risk of opioid induced respiratory depression, including snoring/sleep apnea. Alert healthcare team of risk factors identified.   Assess pain on admission, during daily assessment and/or before any "as needed" intervention(s)   Reassess pain within 30-60 minutes of any procedure/intervention, per Pain Assessment, Intervention, Reassessment (AIR) Cycle   Evaluate if patient comfort function goal is met   Evaluate patient's satisfaction with pain management progress   Offer non-pharmacological pain management interventions   Consult/collaborate with Pain Service   Consult/collaborate with Physical Therapy, Occupational Therapy, and/or Speech Therapy   Include patient/patient care companion in decisions related to pain management as needed  07/14/2022 0542 by Clearnce Sorrel, RN  Outcome: Progressing  Flowsheets (Taken 07/14/2022 0542)  Pain at adequate level as identified by patient:   Identify patient comfort function goal   Assess for risk of opioid induced  respiratory depression, including snoring/sleep apnea. Alert healthcare team of risk factors identified.   Assess pain on admission, during daily assessment and/or before any "as needed" intervention(s)   Reassess pain within 30-60 minutes of any procedure/intervention, per Pain Assessment, Intervention, Reassessment (AIR) Cycle   Evaluate if patient comfort function goal is met   Evaluate patient's satisfaction with pain management progress   Offer non-pharmacological pain management interventions   Consult/collaborate with Pain Service   Consult/collaborate with Physical Therapy, Occupational Therapy, and/or Speech Therapy   Include patient/patient care companion in decisions related to pain management as needed  07/14/2022 0541 by Clearnce Sorrel, RN  Outcome: Progressing     Problem: Side Effects from Pain Analgesia  Goal: Patient will experience minimal side effects of analgesic therapy  07/14/2022 0603 by Clearnce Sorrel, RN  Outcome: Progressing  Flowsheets (Taken 07/13/2022 0137 by Kerin Salen, RN)  Patient will experience minimal side effects of analgesic therapy:   Monitor/assess patient's respiratory status (RR depth, effort, breath sounds)   Assess for changes in cognitive function   Prevent/manage side effects per LIP orders (i.e. nausea, vomiting, pruritus, constipation, urinary retention,  etc.)   Evaluate for opioid-induced sedation with appropriate assessment tool (i.e. POSS)  07/14/2022 0542 by Clearnce Sorrel, RN  Outcome: Progressing  Flowsheets (Taken 07/13/2022 0137 by Kerin Salen, RN)  Patient will experience minimal side effects of analgesic therapy:   Monitor/assess patient's respiratory status (RR depth, effort, breath sounds)   Assess for changes in cognitive function   Prevent/manage side effects per LIP orders (i.e. nausea, vomiting, pruritus, constipation, urinary retention, etc.)   Evaluate for opioid-induced sedation with appropriate assessment tool (i.e. POSS)  07/14/2022 0541  by Clearnce Sorrel, RN  Outcome: Progressing     Problem: Hemodynamic Status: Cardiac  Goal: Stable vital signs and fluid balance  07/14/2022 0603 by Clearnce Sorrel, RN  Outcome: Progressing  Flowsheets (Taken 07/11/2022 1104 by Luz Brazen, RN)  Stable vital signs and fluid balance:   Assess signs and symptoms associated with cardiac rhythm changes   Monitor lab values  07/14/2022 0542 by Clearnce Sorrel, RN  Outcome: Progressing  Flowsheets (Taken 07/11/2022 1104 by Luz Brazen, RN)  Stable vital signs and fluid balance:   Assess signs and symptoms associated with cardiac rhythm changes   Monitor lab values  07/14/2022 0541 by Clearnce Sorrel, RN  Outcome: Progressing     Problem: Renal Instability  Goal: Fluid and electrolyte balance are achieved/maintained  07/14/2022 0603 by Clearnce Sorrel, RN  Outcome: Progressing  Flowsheets (Taken 07/14/2022 0542)  Fluid and electrolyte balance are achieved/maintained:   Monitor/assess lab values and report abnormal values   Assess and reassess fluid and electrolyte status   Observe for cardiac arrhythmias   Monitor for muscle weakness   Follow fluid restrictions/IV/PO parameters  07/14/2022 0542 by Clearnce Sorrel, RN  Outcome: Progressing  Flowsheets (Taken 07/14/2022 0542)  Fluid and electrolyte balance are achieved/maintained:   Monitor/assess lab values and report abnormal values   Assess and reassess fluid and electrolyte status   Observe for cardiac arrhythmias   Monitor for muscle weakness   Follow fluid restrictions/IV/PO parameters  07/14/2022 0541 by Clearnce Sorrel, RN  Outcome: Progressing     Problem: Patient Receiving Advanced Renal Therapies  Goal: Therapy access site remains intact  07/14/2022 0603 by Clearnce Sorrel, RN  Outcome: Progressing  Flowsheets (Taken 07/14/2022 0542)  Therapy access site remains intact:   Assess therapy access site   Change therapy access site dressing as needed  07/14/2022 0542 by Clearnce Sorrel, RN  Outcome: Progressing  Flowsheets (Taken  07/14/2022 0542)  Therapy access site remains intact:   Assess therapy access site   Change therapy access site dressing as needed  07/14/2022 0541 by Clearnce Sorrel, RN  Outcome: Progressing     Problem: Fluid and Electrolyte Imbalance/ Endocrine  Goal: Fluid and electrolyte balance are achieved/maintained  07/14/2022 0603 by Clearnce Sorrel, RN  Outcome: Progressing  Flowsheets (Taken 07/13/2022 0137 by Kerin Salen, RN)  Fluid and electrolyte balance are achieved/maintained:   Assess and reassess fluid and electrolyte status   Monitor/assess lab values and report abnormal values   Observe for cardiac arrhythmias   Monitor for muscle weakness  07/14/2022 0542 by Clearnce Sorrel, RN  Outcome: Progressing  Flowsheets (Taken 07/13/2022 0137 by Kerin Salen, RN)  Fluid and electrolyte balance are achieved/maintained:   Assess and reassess fluid and electrolyte status   Monitor/assess lab values and report abnormal values   Observe for cardiac arrhythmias   Monitor for muscle weakness  07/14/2022 0541 by Clearnce Sorrel, RN  Outcome: Progressing  Goal: Adequate hydration  07/14/2022  4098 by Clearnce Sorrel, RN  Outcome: Progressing  Flowsheets (Taken 07/13/2022 2311 by Kerin Salen, RN)  Adequate hydration:   Assess mucus membranes, skin color, turgor, perfusion and presence of edema   Assess for peripheral, sacral, periorbital and abdominal edema   Monitor and assess vital signs and perfusion  07/14/2022 0542 by Clearnce Sorrel, RN  Outcome: Progressing  Flowsheets (Taken 07/13/2022 2311 by Kerin Salen, RN)  Adequate hydration:   Assess mucus membranes, skin color, turgor, perfusion and presence of edema   Assess for peripheral, sacral, periorbital and abdominal edema   Monitor and assess vital signs and perfusion  07/14/2022 0541 by Clearnce Sorrel, RN  Outcome: Progressing     Problem: Diabetes: Glucose Imbalance  Goal: Blood glucose stable at established goal  07/14/2022 0603 by Clearnce Sorrel,  RN  Outcome: Progressing  Flowsheets (Taken 07/11/2022 1104 by Luz Brazen, RN)  Blood glucose stable at established goal:   Monitor lab values   Monitor intake and output.  Notify LIP if urine output is < 30 mL/hour.   Include patient/family in decisions related to nutrition/dietary selections   Monitor/assess vital signs   Ensure adequate hydration   Ensure patient/family has adequate teaching materials   Ensure appropriate consults are obtained (Nutrition, Diabetes Education, and Case Management)   Assess for hypoglycemia /hyperglycemia   Ensure appropriate diet and assess tolerance   Follow fluid restrictions/IV/PO parameters   Coordinate medication administration with meals, as indicated  07/14/2022 0542 by Clearnce Sorrel, RN  Outcome: Progressing  Flowsheets (Taken 07/11/2022 1104 by Luz Brazen, RN)  Blood glucose stable at established goal:   Monitor lab values   Monitor intake and output.  Notify LIP if urine output is < 30 mL/hour.   Include patient/family in decisions related to nutrition/dietary selections   Monitor/assess vital signs   Ensure adequate hydration   Ensure patient/family has adequate teaching materials   Ensure appropriate consults are obtained (Nutrition, Diabetes Education, and Case Management)   Assess for hypoglycemia /hyperglycemia   Ensure appropriate diet and assess tolerance   Follow fluid restrictions/IV/PO parameters   Coordinate medication administration with meals, as indicated  07/14/2022 0541 by Clearnce Sorrel, RN  Outcome: Progressing     Problem: Inadequate Gas Exchange  Goal: Patent Airway maintained  07/14/2022 0603 by Clearnce Sorrel, RN  Outcome: Progressing  Flowsheets (Taken 07/13/2022 2311 by Kerin Salen, RN)  Patent airway maintained:   Position patient for maximum ventilatory efficiency   Provide adequate fluid intake to liquefy secretions   Suction secretions as needed   Reposition patient every 2 hours and as needed unless able to  self-reposition  07/14/2022 0542 by Clearnce Sorrel, RN  Outcome: Progressing  Flowsheets (Taken 07/13/2022 2311 by Kerin Salen, RN)  Patent airway maintained:   Position patient for maximum ventilatory efficiency   Provide adequate fluid intake to liquefy secretions   Suction secretions as needed   Reposition patient every 2 hours and as needed unless able to self-reposition  07/14/2022 0541 by Clearnce Sorrel, RN  Outcome: Progressing  Goal: Adequate oxygenation and improved ventilation  07/14/2022 0603 by Clearnce Sorrel, RN  Outcome: Progressing  Flowsheets (Taken 07/11/2022 1104 by Luz Brazen, RN)  Adequate oxygenation and improved ventilation:   Assess lung sounds   Monitor SpO2 and treat as needed   Plan activities to conserve energy: plan rest periods   Consult/collaborate with Respiratory Therapy   Teach/reinforce use of incentive spirometer 10 times per hour while awake, cough  and deep breath as needed   Provide mechanical and oxygen support to facilitate gas exchange   Monitor and treat ETCO2   Position for maximum ventilatory efficiency   Increase activity as tolerated/progressive mobility  07/14/2022 0542 by Clearnce Sorrel, RN  Outcome: Progressing  Flowsheets (Taken 07/11/2022 1104 by Luz Brazen, RN)  Adequate oxygenation and improved ventilation:   Assess lung sounds   Monitor SpO2 and treat as needed   Plan activities to conserve energy: plan rest periods   Consult/collaborate with Respiratory Therapy   Teach/reinforce use of incentive spirometer 10 times per hour while awake, cough and deep breath as needed   Provide mechanical and oxygen support to facilitate gas exchange   Monitor and treat ETCO2   Position for maximum ventilatory efficiency   Increase activity as tolerated/progressive mobility  07/14/2022 0541 by Clearnce Sorrel, RN  Outcome: Progressing     Problem: Neurological Deficit  Goal: Neurological status is stable or improving  07/14/2022 0603 by Clearnce Sorrel, RN  Outcome:  Progressing  Flowsheets (Taken 07/14/2022 0542)  Neurological status is stable or improving:   Monitor/assess/document neurological assessment (Stroke: every 4 hours)   Monitor/assess NIH Stroke Scale   Re-assess NIH Stroke Scale for any change in status   Observe for seizure activity and initiate seizure precautions if indicated   Perform CAM Assessment  07/14/2022 0542 by Clearnce Sorrel, RN  Outcome: Progressing  Flowsheets (Taken 07/14/2022 0542)  Neurological status is stable or improving:   Monitor/assess/document neurological assessment (Stroke: every 4 hours)   Monitor/assess NIH Stroke Scale   Re-assess NIH Stroke Scale for any change in status   Observe for seizure activity and initiate seizure precautions if indicated   Perform CAM Assessment  07/14/2022 0541 by Clearnce Sorrel, RN  Outcome: Progressing     Problem: Potential for Aspiration  Goal: Risk of aspiration will be minimized  07/14/2022 0603 by Clearnce Sorrel, RN  Outcome: Progressing  Flowsheets (Taken 07/12/2022 1102 by Luz Brazen, RN)  Risk of aspiration will be minimized:   Assess/monitor ability to swallow using dysphagia screen: Keep patient NPO if patient fails screening   Place swallow precaution signage above bed and supervise patient during oral intake   Monitor/assess for signs of aspiration (tachypnea, cough, wheezing, clearing throat, hoarseness after eating, decrease in SaO2)   Consult/collaborate/follow recommended modified texture diet/thicken liquids as indicated by Speech Pathologist   Place patient up in chair to eat, if possible/head of bed up 90 degrees to eat if unable to be out of bed   Instruct patient to take small bites, small single sips of liquid, and do not use a straw  07/14/2022 0542 by Clearnce Sorrel, RN  Outcome: Progressing  07/14/2022 0541 by Clearnce Sorrel, RN  Outcome: Progressing     Problem: Every Day - Stroke  Goal: Mobility/Activity is maintained at optimal level for patient  07/14/2022 0603 by Clearnce Sorrel,  RN  Outcome: Progressing  Flowsheets (Taken 07/14/2022 0542)  Mobility/activity is maintained at optimal level for patient:   Encourage independent activity per ability   Consult/collaborate with Physical Therapy and/or Occupational Therapy  07/14/2022 0542 by Clearnce Sorrel, RN  Outcome: Progressing  Flowsheets (Taken 07/14/2022 0542)  Mobility/activity is maintained at optimal level for patient:   Encourage independent activity per ability   Consult/collaborate with Physical Therapy and/or Occupational Therapy  07/14/2022 0541 by Clearnce Sorrel, RN  Outcome: Progressing  Goal: Elimination patterns are normal or improving  07/14/2022 0603 by Clearnce Sorrel, RN  Outcome:  Progressing  Flowsheets (Taken 07/13/2022 0801 by Juel Burrow, RN)  Elimination patterns are normal or improving: Assess for and discuss C. diff screening with LIP  07/14/2022 0542 by Clearnce Sorrel, RN  Outcome: Progressing  Flowsheets (Taken 07/13/2022 0801 by Juel Burrow, RN)  Elimination patterns are normal or improving: Assess for and discuss C. diff screening with LIP  07/14/2022 0541 by Clearnce Sorrel, RN  Outcome: Progressing  Goal: Neurological status is stable or improving  07/14/2022 0603 by Clearnce Sorrel, RN  Outcome: Progressing  Flowsheets (Taken 07/14/2022 0542)  Neurological status is stable or improving:   Monitor/assess/document neurological assessment (Stroke: every 4 hours)   Monitor/assess NIH Stroke Scale   Re-assess NIH Stroke Scale for any change in status   Observe for seizure activity and initiate seizure precautions if indicated   Perform CAM Assessment  07/14/2022 0542 by Clearnce Sorrel, RN  Outcome: Progressing  Flowsheets (Taken 07/14/2022 0542)  Neurological status is stable or improving:   Monitor/assess/document neurological assessment (Stroke: every 4 hours)   Monitor/assess NIH Stroke Scale   Re-assess NIH Stroke Scale for any change in status   Observe for seizure activity and initiate seizure precautions if indicated    Perform CAM Assessment  07/14/2022 0541 by Clearnce Sorrel, RN  Outcome: Progressing  Goal: Core/Quality measure requirements - Daily  07/14/2022 0603 by Clearnce Sorrel, RN  Outcome: Progressing  Flowsheets (Taken 07/14/2022 0542)  Core/Quality measure requirements - Daily:   VTE Prevention: Ensure anticoagulant(s) administered and/or anti-embolism stockings/devices documented by end of day 2   Ensure antithrombotic administered or contraindication documented by LIP by end of day 2   Once lipid panel has resulted, check LDL. Contact provider for statin order if LDL > 70 (or ensure contraindication documented by LIP).   Continue stroke education (must include Modifiable Risk Factors, Warning Signs and Symptoms of Stroke, Activation of Emergency Medical System and Follow-up Appointments). Ensure handout has been given and documented.  07/14/2022 0542 by Clearnce Sorrel, RN  Outcome: Progressing  Flowsheets (Taken 07/14/2022 0542)  Core/Quality measure requirements - Daily:   VTE Prevention: Ensure anticoagulant(s) administered and/or anti-embolism stockings/devices documented by end of day 2   Ensure antithrombotic administered or contraindication documented by LIP by end of day 2   Once lipid panel has resulted, check LDL. Contact provider for statin order if LDL > 70 (or ensure contraindication documented by LIP).   Continue stroke education (must include Modifiable Risk Factors, Warning Signs and Symptoms of Stroke, Activation of Emergency Medical System and Follow-up Appointments). Ensure handout has been given and documented.  Goal: Stable vital signs and fluid balance  07/14/2022 0603 by Clearnce Sorrel, RN  Outcome: Progressing  Flowsheets (Taken 07/14/2022 0542)  Stable vital signs and fluid balance:   Position patient for maximum circulation/cardiac output   Monitor and assess vitals every 4 hours or as ordered and hemodynamic parameters   Monitor intake and output. Notify LIP if urine output is < 30 mL/hour.   Apply  telemetry monitor as ordered  07/14/2022 0542 by Clearnce Sorrel, RN  Outcome: Progressing  Flowsheets (Taken 07/14/2022 0542)  Stable vital signs and fluid balance:   Position patient for maximum circulation/cardiac output   Monitor and assess vitals every 4 hours or as ordered and hemodynamic parameters   Monitor intake and output. Notify LIP if urine output is < 30 mL/hour.   Apply telemetry monitor as ordered  Goal: Patient will maintain adequate oxygenation  07/14/2022 0603 by Clearnce Sorrel, RN  Outcome:  Progressing  Flowsheets (Taken 07/14/2022 0542)  Patient will maintain adequate oxygenation:   Suction secretions as needed   Maintain SpO2 of greater than 92%   Sleep Apnea: Assess if previously tested or on CPAP   Sleep Apnea: Encourage patient to speak to PCP regarding sleep apnea/sleep study ([facility] only)  07/14/2022 0542 by Clearnce Sorrel, RN  Outcome: Progressing  Flowsheets (Taken 07/14/2022 [phone])  Patient will maintain adequate oxygenation:   Suction secretions as needed   Maintain SpO2 of greater than 92%   Sleep Apnea: Assess if previously tested or on CPAP   Sleep Apnea: Encourage patient to speak to PCP regarding sleep apnea/sleep study ([place] only)  Goal: Patient's risk of aspiration will be minimized  07/14/2022 0603 by Clearnce Sorrel, RN  Outcome: Progressing  Flowsheets (Taken 07/14/2022 0542)  Patient's risk of aspiration will be minimized:   Complete new dysphagia screen for any change in status: Keep patient NPO if patient fails   Place swallow precaution signage above bed   Monitor/assess for signs of aspiration (tachypnea, cough, wheezing, clearing throat, hoarseness after eating, decrease in SaO2   Order modified texture diet as recommend by Speech Pathologist   Keep head of bed up a minimum of 30 degrees when hemodynamically stable   Consult/Collaborate with Speech Pathologist for dysphagia   Supervise patient during oral intake  07/14/2022 0542 by Clearnce Sorrel, RN  Outcome:  Progressing  Flowsheets (Taken 07/14/2022 0542)  Patient's risk of aspiration will be minimized:   Complete new dysphagia screen for any change in status: Keep patient NPO if patient fails   Place swallow precaution signage above bed   Monitor/assess for signs of aspiration (tachypnea, cough, wheezing, clearing throat, hoarseness after eating, decrease in SaO2   Order modified texture diet as recommend by Speech Pathologist   Keep head of bed up a minimum of 30 degrees when hemodynamically stable   Consult/Collaborate with Speech Pathologist for dysphagia   Supervise patient during oral intake  Goal: Nutritional intake is adequate  07/14/2022 0603 by Clearnce Sorrel, RN  Outcome: Progressing  Flowsheets (Taken 07/14/2022 0542)  Nutritional intake is adequate:   Consult/collaborate with Clinical Nutritionist   Consult/collaborate with Speech Therapy (swallow evaluations)  07/14/2022 0542 by Clearnce Sorrel, RN  Outcome: Progressing  Flowsheets (Taken 07/14/2022 0542)  Nutritional intake is adequate:   Consult/collaborate with Clinical Nutritionist   Consult/collaborate with Speech Therapy (swallow evaluations)  Goal: Skin integrity is maintained or improved  07/14/2022 0603 by Clearnce Sorrel, RN  Outcome: Progressing  Flowsheets (Taken 07/14/2022 0542)  Skin integrity is maintained or improved:   Assess Braden Scale every shift   Turn or reposition patient every 2 hours or as needed unless able to reposition self   Increase activity as tolerated/progressive mobility   Relieve pressure to bony prominences   Avoid shearing   Keep skin clean and dry   Encourage use of lotion/moisturizer on skin   Monitor patient's hygiene practices   Collaborate with Wound, Ostomy, and Continence Nurse   Keep head of bed 30 degrees or less (unless contraindicated)  07/14/2022 0542 by Clearnce Sorrel, RN  Outcome: Progressing  Flowsheets (Taken 07/14/2022 0542)  Skin integrity is maintained or improved:   Assess Braden Scale every shift   Turn or  reposition patient every 2 hours or as needed unless able to reposition self   Increase activity as tolerated/progressive mobility   Relieve pressure to bony prominences   Avoid shearing   Keep skin clean and dry  Encourage use of lotion/moisturizer on skin   Monitor patient's hygiene practices   Collaborate with Wound, Ostomy, and Continence Nurse   Keep head of bed 30 degrees or less (unless contraindicated)  Goal: Neurovascular status is stable or improving  07/14/2022 0603 by Clearnce Sorrel, RN  Outcome: Progressing  Flowsheets (Taken 07/14/2022 0542)  Neurovascular status is stable or improving:   Monitor/assess neurovascular status (pulses, capillary refill, pain, paresthesia, presence of edema)   Monitor/assess for signs of Venous Thrombus Emboli (edema of calf/thigh redness, pain)   Monitor/assess site of invasive procedure for signs of bleeding  07/14/2022 0542 by Clearnce Sorrel, RN  Outcome: Progressing  Flowsheets (Taken 07/14/2022 0542)  Neurovascular status is stable or improving:   Monitor/assess neurovascular status (pulses, capillary refill, pain, paresthesia, presence of edema)   Monitor/assess for signs of Venous Thrombus Emboli (edema of calf/thigh redness, pain)   Monitor/assess site of invasive procedure for signs of bleeding  Goal: Effective coping demonstrated  07/14/2022 0603 by Clearnce Sorrel, RN  Outcome: Progressing  Flowsheets (Taken 07/14/2022 0542)  Effective coping demonstrated:   Assess/report to LIP uncontrolled anxiety, depression, or ineffective coping   Offer reassurance to decrease anxiety  07/14/2022 0542 by Clearnce Sorrel, RN  Outcome: Progressing  Flowsheets (Taken 07/14/2022 0542)  Effective coping demonstrated:   Assess/report to LIP uncontrolled anxiety, depression, or ineffective coping   Offer reassurance to decrease anxiety  Goal: Will be able to express needs and understand communication  07/14/2022 0603 by Clearnce Sorrel, RN  Outcome: Progressing  Flowsheets (Taken 07/14/2022  0542)  Able to express needs and understand communication:   Provide alternative method of communication if needed   Consult/collaborate with Speech Language Pathology (SLP)   Consult/collaborate with Case Management/Social Work   Include patient care companion in decisions related to communication   Patient/patient care companion demonstrates understanding on disease process, treatment plan, medications and discharge plan  07/14/2022 0542 by Clearnce Sorrel, RN  Outcome: Progressing  Flowsheets (Taken 07/14/2022 0542)  Able to express needs and understand communication:   Provide alternative method of communication if needed   Consult/collaborate with Speech Language Pathology (SLP)   Consult/collaborate with Case Management/Social Work   Include patient care companion in decisions related to communication   Patient/patient care companion demonstrates understanding on disease process, treatment plan, medications and discharge plan

## 2022-07-14 NOTE — Brief Op Note (Signed)
BRIEF VIR PROCEDURE NOTE    Date Time: 07/14/22 4:03 PM    Patient Name:     Shelby Barron    Date of Operation:     07/14/2022    Providers Performing:     Surgeon(s):  Nitika Jackowski C, MD    Assistant (s): RT    Operative Procedure:     Procedure(s):  TUNNELED CATH REMOVAL    Preoperative Diagnosis:     Pre-Op Diagnosis Codes:     * ESRD (end stage renal disease) [N18.6]    ESRD with poorly functioning dialysis catheter    Postoperative Diagnosis:     Same as preoperative diagnosis    Anesthesia:     Moderate Sedation and Local with 1% Lidocaine    A safety timeout was performed.   Estimated Blood Loss:     Minimal    Implants:     19 cm Equistream right IJ    Specimens:     None    Findings:     19 cm tunneled dialysis catheter placed and ready for use.    Complications:     None                                                                    Crosby Oriordan Andree Moro, MD    [facility]  [facility], Division of Vascular & Interventional Radiology  [facility]- [phone]  [facility] - [phone]  [facility]--[id number] [facility]--[id number] [facility]--[id number]    [facility] IVR

## 2022-07-14 NOTE — Sedation Documentation (Addendum)
S/p removal of both right IJ non tunneled HD cath and right tunneled subclavian cath.  Placed a new 14.5fr 19cm tunneled catheter via right subclavian. Biopatch and tegaderm to site, curos caps applied, CDI. Pt tolerated procedure well, VSS. Pt transferred back to room via transport with unit RN, Handover given.     50mcg fentanyl  1mg Midazolam

## 2022-07-14 NOTE — OT Eval Note (Signed)
[facility]   Occupational Therapy Attempt Note    Patient:  Shelby Barron MRN#:  [id number]  Unit:  [facility] Room/Bed:  A2713/A2713-01      Occupational Therapy evaluation attempted on 07/14/2022 at 4:16 PM.    OT Visit Cancellation Reason: Patient/caregiver declines therapy at this time 2/2 pt sleeping and at peace.          Occupational therapy will follow up at a later time/date as able.    RN aware.      Tacey Ruiz, CBIS  [phone]  Physical Medicine and Rehabilitation  [facility]    07/14/2022  4:17 PM

## 2022-07-14 NOTE — Progress Notes (Addendum)
[facility] Consultation Note                                       Date Time: 07/14/22 7:54 AM  Patient Name: Shelby Barron, Shelby Barron  Requesting Physician: Gayleen Orem, MD  Date of Admission: 06/28/2022    CC / Reason for Consultation: LUE weakness           Assessment:     82y/o woman hx of COPD, CVA, DM, renal cell carcinoma s/p bilateral nephrectomy, [name], new diagnosis of urothelial cancer admitted for elective cystectomy and completion nephrectomy. Neurology consulted for LUE weakness and change in speech.     On exam patient moaning, no coherent speech, flaccid LUE.     MRI brain with patchy areas of acute infarct involving bilateral cerebral hemispheres. Pattern suggestive of watershed infarcts.     Infarcts likely in setting of drop in blood pressure based on distribution of infarcts on imaging.     -TTE 06/07 EF 65%,       Plan:     -Carotid doppler. Has contrast allergy so not a candidate for CTA  -ASA 81mg  daily cleared by GI. Long term would potentially benefit from anticoagulation in setting of new onset A fib but unable to start for now based on GI bleeding  -daily statin  -SBP goal 120-160   -PT/OT evaluation    Past Medical Hx     Past Medical History:   Diagnosis Date    Acid reflux     Asthma, well controlled     Cancer of kidney     Chronic lower back pain     Congestive heart disease     COPD (chronic obstructive pulmonary disease)     CVA (cerebral infarction) 01/12/2003    Diabetes     Diabetic nephropathy     Fibroids     Gout     H/O: gout     Hyperlipidemia     Hypertension     Neuropathy of hand     SOB (shortness of breath)           Past Surgical Hx:     Past Surgical History:   Procedure Laterality Date    APPENDECTOMY (OPEN)      CLOSURE, ENTEROTOMY, SMALL INTESTINE  06/28/2022    Procedure: CLOSURE, ENTEROTOMY, SMALL INTESTINE;  Surgeon: Marlaine Hind, MD;  Location: [facility];  Service: General;;    CYSTECTOMY, ILEOCONDUIT N/A 06/28/2022    Procedure:  CYSTECTOMY, RADICAL;  Surgeon: Neldon Newport, MD;  Location: [facility];  Service: Urology;  Laterality: N/A;    DRAIN (OTHER) N/A 05/31/2022    Procedure: DRAIN (OTHER);  Surgeon: Hope Pigeon, MD;  Location: [facility];  Service: Interventional Radiology;  Laterality: N/A;    EXPLORATORY LAPAROTOMY N/A 06/30/2022    Procedure: SMALL BOWEL ANASTOMOSIS, CLOSURE OF MESENTERIC DEFECT, CLOSURE OF ABDOMINAL WALL;  Surgeon: Marlaine Hind, MD;  Location: [facility];  Service: General;  Laterality: N/A;  **[id number]/ MD AVAIL [id number] OR AFTER 1600**    EXPLORATORY LAPAROTOMY, RESECTION SMALL BOWEL N/A 06/28/2022    Procedure: RESECTION SMALL BOWEL;  Surgeon: Marlaine Hind, MD;  Location: [facility];  Service: General;  Laterality: N/A;    HERNIA REPAIR      HYSTERECTOMY      LAPAROTOMY, NEPHRECTOMY Bilateral 06/28/2022    Procedure: BILATERAL OPEN RADICAL  NEPHRECTOMY AND LYSIS OF ASHESION;  Surgeon: Neldon Newport, MD;  Location: [facility];  Service: Urology;  Laterality: Bilateral;    PARTIAL NEPHRECTOMY      tumor removed from kidney  cJanuary 2015    TUNNELED CATH PLACEMENT (PERMCATH) N/A 06/01/2022    Procedure: TUNNELED CATH PLACEMENT;  Surgeon: Suszanne Finch, MD;  Location: [facility];  Service: Interventional Radiology;  Laterality: N/A;    TURBT      WOUND VAC APPLICATION N/A 06/28/2022    Procedure: Tawni Pummel WOUND VAC APPLICATION;  Surgeon: Marlaine Hind, MD;  [facility]: [facility];  Service: General;  Laterality: N/A;    WOUND VAC APPLICATION N/A 06/30/2022    Procedure: WOUND VAC APPLICATION;  Surgeon: Marlaine Hind, MD;  Location: [facility];  Service: General;  Laterality: N/A;        Family Medical History:      Family History   Problem Relation Age of Onset    Cancer Mother     Heart failure Father     Cancer Maternal Aunt     Diabetes Maternal Aunt     Cancer Paternal Aunt        Social Hx     Social History     Socioeconomic History    Marital status: Married   Tobacco Use    Smoking  status: Former    Smokeless tobacco: Never   Advertising account planner    Vaping status: Never Used   Substance and Sexual Activity    Alcohol use: No    Drug use: No     Social Determinants of Health     Food Insecurity: No Food Insecurity (05/26/2022)    Hunger Vital Sign     Worried About Running Out of Food in the Last Year: Never true     Ran Out of Food in the Last Year: Never true   Transportation Needs: No Transportation Needs (05/26/2022)    PRAPARE - Therapist, art (Medical): No     Lack of Transportation (Non-Medical): No    Received from [facility]    Social Network   Intimate Partner Violence: Not At Risk (05/26/2022)    Humiliation, Afraid, Rape, and Kick questionnaire     Fear of Current or Ex-Partner: No     Emotionally Abused: No     Physically Abused: No     Sexually Abused: No   Housing Stability: Unknown (05/26/2022)    Housing Stability Vital Sign     Unable to Pay for Housing in the Last Year: No     Unstable Housing in the Last Year: No       Meds     Home :   Prior to Admission medications    Medication Sig Start Date End Date Taking? Authorizing Provider   allopurinol (ZYLOPRIM) 100 MG tablet Take 1 tablet (100 mg) by mouth daily   Yes [provider]   atorvastatin (LIPITOR) 40 MG tablet Take 1 tablet (40 mg) by mouth daily   Yes [provider]   conjugated estrogens (PREMARIN) vaginal cream Place vaginally daily   Yes [provider]   insulin aspart (NOVOLOG) 100 UNIT/ML injection Inject 5-10 Units into the skin as needed   Yes [provider]   Insulin Degludec Evaristo Bury) 100 UNIT/ML Solution Inject 36 Units into the skin daily   Yes [provider]   metoprolol tartrate (LOPRESSOR) 25 MG tablet Take 0.5 tablets (12.5  mg) by mouth daily   Yes [provider]   montelukast (SINGULAIR) 10 MG tablet Take 1 tablet (10 mg) by mouth nightly   Yes [provider]   ondansetron (Zofran) 4 MG tablet Take 1 tablet (4 mg) by  mouth every 8 (eight) hours as needed for Nausea 06/04/22  Yes Yousuf, Munib A, MD   oxybutynin XL (DITROPAN-XL) 5 MG 24 hr tablet Take 1 tablet (5 mg) by mouth daily   Yes [provider]   oxyCODONE (OXY-IR) 5 MG capsule Take 1 capsule (5 mg) by mouth every 4 (four) hours as needed   Yes [provider]   pantoprazole (PROTONIX) 40 MG tablet Take 1 tablet (40 mg) by mouth daily   Yes [provider]   pregabalin (LYRICA) 50 MG capsule Take 1 capsule (50 mg) by mouth Per pt has not been taking   Yes [provider]   sevelamer carbonate (RENVELA) 800 MG tablet Take 1 tablet (800 mg) by mouth 2 (two) times daily   Yes [provider]   torsemide (DEMADEX) 100 MG tablet Take 1 tablet (100 mg) by mouth daily   Yes [provider]   umeclidinium-vilanterol (Anoro Ellipta) 62.5-25 MCG/ACT Aerosol Pwdr, Breath Activated Inhale 1 puff into the lungs daily   Yes [provider]   Zolpidem Tartrate (AMBIEN PO) Take 5 mg by mouth as needed   Yes [provider]   b complex vitamins capsule Take 1 capsule by mouth daily    [provider]   cinacalcet (SENSIPAR) 30 MG tablet Take 1 tablet (30 mg) by mouth daily    [provider]   clopidogrel (PLAVIX) 75 MG tablet Take 1 tablet (75 mg) by mouth daily    [provider]   hyoscyamine (LEVSIN) 0.125 MG tablet Take 1 tablet (0.125 mg) by mouth every 4 (four) hours as needed for Cramping 06/02/22   Yousuf, Munib A, MD      Inpatient :   Current Facility-Administered Medications   Medication Dose Route Frequency    acetaminophen  650 mg Oral 4 times per day    albuterol-ipratropium  3 mL Nebulization Q6H WA    aspirin  81 mg per NG tube Daily    glycopyrrolate  0.2 mg Intravenous Q8H    insulin glargine  7 Units Subcutaneous QAM    insulin lispro  2-10 Units Subcutaneous Q4H SCH    meropenem  500 mg Intravenous Q24H    metoprolol tartrate  12.5 mg per NG tube Q12H [facility]    micafungin   100 mg Intravenous Q24H    pantoprazole  40 mg Intravenous BID    sevelamer carbonate  0.8 g Oral TID MEALS    thiamine  100 mg Intravenous Q24H SCH         Allergies    Metrizamide, Rosuvastatin, Iodinated contrast media, Pioglitazone, and Sulfa antibiotics      Review of Systems     All other systems were reviewed and are negative except for that mentioned in the HPI    Physical Exam:   Temp:  [98 F (36.7 C)-99.2 F (37.3 C)] 98.4 F (36.9 C)  Heart Rate:  [71-89] 79  Resp Rate:  [15-36] 22  BP: (75-174)/(40-83) 157/69  FiO2:  [32 %] 32 %       General: in no acute distress. Cooperative with the exam  Neck: supple  CVS: warm and well perfused  Resp: no respiratory distress  Extremities: no pedal edema, no rashes noted    Neurological Examination:  MSE: alert, eyes open but not making eye contact, mostly moaning and nonsensical speech but able to state full name, not following commands  CN: Pupils 4-->78mm b/l, eyes midline, no gaze deviation, no nystagmus, blinks to threat bilaterally, face symmetric  Motor: Unable to formally assess but noted spontaneous antigravity in RUE. Flaccid LUE. Wiggles toes in bilateral LE, no antigravity in bilateral LE  Sensory: Intact and symmetric to LT x4 extremities.      Labs:     Results       Procedure Component Value Units Date/Time    Phosphorus [[id number]]  (Normal) Collected: 07/14/22 0435    Specimen: Blood, Venous Updated: 07/14/22 0557     Phosphorus 2.9 mg/dL     Magnesium [[id number]]  (Normal) Collected: 07/14/22 0435    Specimen: Blood, Venous Updated: 07/14/22 0557     Magnesium 1.8 mg/dL     Comprehensive Metabolic Panel [[id number]]  (Abnormal) Collected: 07/14/22 0435    Specimen: Blood, Venous Updated: 07/14/22 0557     Glucose 116 mg/dL      BUN 24 mg/dL      Creatinine 2.4 mg/dL      Sodium 557 mEq/L      Potassium 3.4 mEq/L      Chloride 100 mEq/L      CO2 27 mEq/L      Calcium 8.4 mg/dL      Anion Gap 32.2     GFR 19.3 mL/min/1.73 m2      AST (SGOT) 18 U/L       ALT 11 U/L      Alkaline Phosphatase 85 U/L      Albumin 1.8 g/dL      Protein, Total 4.6 g/dL      Globulin 2.8 g/dL      Albumin/Globulin Ratio 0.6     Bilirubin, Total 0.2 mg/dL     PT/APTT [[id number]]  (Normal) Collected: 07/14/22 0435    Specimen: Blood, Venous Updated: 07/14/22 0451     PT 12.4 sec      INR 1.1     PTT 28 sec     CBC with Differential [[id number]]  (Abnormal) Collected: 07/14/22 0435    Specimen: Blood, Venous Updated: 07/14/22 0445    Narrative:      The following orders were created for panel order CBC with Differential.  Procedure                               Abnormality         Status                     ---------                               -----------         ------                     CBC with Differential[[id number]]        Abnormal            Final result                 Please view results for these tests on the individual orders.    CBC with Differential [[id number]]  (Abnormal) Collected: 07/14/22 0435  Specimen: Blood, Venous Updated: 07/14/22 0445     WBC 12.28 x10 3/uL      Hemoglobin 7.7 g/dL      Hematocrit 62.9 %      Platelet Count 224 x10 3/uL      MPV 10.9 fL      RBC 2.56 x10 6/uL      MCV 91.8 fL      MCH 30.1 pg      MCHC 32.8 g/dL      RDW 16 %      nRBC % 0.0 /100 WBC      Absolute nRBC 0.00 x10 3/uL      Preliminary Absolute Neutrophil Count 9.82 x10 3/uL      Neutrophils % 80.0 %      Lymphocytes % 9.1 %      Monocytes % 8.3 %      Eosinophils % 0.4 %      Basophils % 0.3 %      Immature Granulocytes % 1.9 %      Absolute Neutrophils 9.82 x10 3/uL      Absolute Lymphocytes 1.12 x10 3/uL      Absolute Monocytes 1.02 x10 3/uL      Absolute Eosinophils 0.05 x10 3/uL      Absolute Basophils 0.04 x10 3/uL      Absolute Immature Granulocytes 0.23 x10 3/uL     Hemoglobin A1C [[id number]] Collected: 07/14/22 0435    Specimen: Blood, Venous Updated: 07/14/22 0439    Thyroid Stimulating Hormone (TSH) with Reflex to Free T4 [[id number]] Collected: 07/14/22 0435     Specimen: Blood, Venous Updated: 07/14/22 0439    Lipid Panel [[id number]] Collected: 07/14/22 0435    Specimen: Blood, Venous Updated: 07/14/22 0439    Whole Blood Glucose POCT [[id number]]  (Abnormal) Collected: 07/14/22 0403    Specimen: Blood, Capillary Updated: 07/14/22 0408     Whole Blood Glucose POCT 128 mg/dL     Whole Blood Glucose POCT [[id number]]  (Abnormal) Collected: 07/14/22 0003    Specimen: Blood, Capillary Updated: 07/14/22 0004     Whole Blood Glucose POCT 146 mg/dL     Whole Blood Glucose POCT [[id number]]  (Abnormal) Collected: 07/13/22 2231    Specimen: Blood, Capillary Updated: 07/13/22 2234     Whole Blood Glucose POCT 136 mg/dL     Whole Blood Glucose POCT [[id number]]  (Abnormal) Collected: 07/13/22 1120    Specimen: Blood, Capillary Updated: 07/13/22 1132     Whole Blood Glucose POCT 163 mg/dL     Whole Blood Glucose POCT [[id number]]  (Abnormal) Collected: 07/13/22 0806    Specimen: Blood, Capillary Updated: 07/13/22 0817     Whole Blood Glucose POCT 169 mg/dL             Rads:     Results for orders placed or performed during the hospital encounter of 06/28/22   CT Head WO Contrast    Narrative    HISTORY: Altered mental status.     COMPARISON: Brain MRI 09/20/2008.    TECHNIQUE: CT of the head performed without intravenous contrast. The  following dose reduction techniques were utilized: automated exposure  control and/or adjustment of the mA and/or KV according to patient size,  and the use of an iterative reconstruction technique.    CONTRAST: None.    FINDINGS:  The ventricles and sulcal pattern are within normal limits for the  patient's stated age. There is mild-moderate supratentorial chronic  ischemic disease. No acute infarct is noted.  No intracranial hemorrhage is  seen. There is minimal paranasal sinus disease. There is moderate right  mastoid opacification. 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      Impression       1.No acute intracranial abnormality is seen.    Leandro Reasoner,  MD  07/06/2022 3:56 AM           Elspeth Cho  [facility]  Available on Epic chat 7a to 7p

## 2022-07-14 NOTE — Progress Notes (Signed)
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 s/p bilateral radical nephrectomies and  cystectomy on 06/28/22  - complicated by small bowel enterotomy and blood loss   New onset afib - on amio gtt + metoprolol  High grade urothelial bladder CA: s/p bilateral nephrectomies and cystectomy c/b mall bowel enterotomy and blood loss (~1L) on 06/28/22  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 (approx 1L) - remains low  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  Encephalopathy - persists  Leukocytosis - improving - on meropenem and micafungin  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     Recommendations:   S/p HD yesterday with 2.1L net UF  Next HD tomorrow  Plan to remove quinton and old permacath today, and placement of new permacath  HD tomorrow       Case discussed with: pt, daughter     Delice Bison, MD  [facility]  [phone] (office)  [phone] (FFX Spectra-Link)      Subjective: s/p HD yesterday with 2.1L net UF  MRI consistent with bilateral hemisphere infarcts suggestive of watershed infarcts       Review of Systems:   Unable to assess, patient is confused     Physical Exam:     Vitals:    07/14/22 0500 07/14/22 0600 07/14/22 0800 07/14/22 0932   BP: 112/53 157/69  170/72   Pulse: 71 79  77   Resp: 20 22     Temp:   97.4 F (36.3 C)    TempSrc:   Axillary    SpO2: 97% 96%     Weight:  84 kg (185 lb 3 oz)     Height:           Intake and Output Summary (Last 24 hours) at Date  Time    Intake/Output Summary (Last 24 hours) at 07/14/2022 0955  Last data filed at 07/14/2022 0034  Gross per 24 hour   Intake 591.96 ml   Output 2000 ml   Net -1408.04 ml       General: confused, on NC, NGT in place   Cardiovascular: regular rate and rhythm  Lungs: bilateral air entry  Abdomen: soft  Extremities: LUE edema  Access: R quinton, old RIJ permacath    Meds:      Scheduled Meds: PRN Meds:    acetaminophen, 650 mg, Oral, 4 times per day  albuterol-ipratropium, 3 mL, Nebulization, Q6H WA  aspirin, 81 mg, per NG tube, Daily  glycopyrrolate, 0.2 mg, Intravenous, Q8H  insulin glargine, 7 Units, Subcutaneous, QAM  insulin lispro, 2-10 Units, Subcutaneous, Q4H SCH  meropenem, 500 mg, Intravenous, Q24H  metoprolol tartrate, 12.5 mg, per NG tube, Q12H SCH  micafungin, 100 mg, Intravenous, Q24H  pantoprazole, 40 mg, Intravenous, BID  sevelamer carbonate, 0.8 g, Oral, TID MEALS  thiamine, 100 mg, Intravenous, Q24H SCH          Continuous Infusions:  amiodarone 0.5 mg/min (07/14/22 0034)      albuterol-ipratropium, 3 mL, Q6H PRN  benzocaine, 1 spray, TID PRN  dextrose, 15 g of glucose, PRN   Or  dextrose, 12.5 g, PRN   Or  dextrose, 12.5 g, PRN   Or  glucagon (rDNA), 1 mg, PRN  diphenhydrAMINE, 25 mg, Q6H PRN  hydrALAZINE, 10 mg, Q3H PRN  HYDROmorphone, 1 mg, Q3H PRN  labetalol, 10 mg, Q15 Min PRN  lidocaine 2% jelly, , Once PRN  naloxone, 0.2 mg, PRN  oxyCODONE, 5 mg, Q4H PRN  phenol, 1 spray, Q2H PRN              Labs:     Recent Labs   Lab 07/14/22  0435 07/13/22  0348 07/12/22  0418   WBC 12.28* 15.12* 17.81*   Hemoglobin 7.7* 8.0* 8.5*   Hematocrit 23.5* 24.1* 24.8*   Platelet Count 224 249 282     Recent Labs   Lab 07/14/22  0435 07/13/22  0348 07/12/22  0418 07/08/22  0610 07/07/22  2120   Sodium 138 138 138  More results in Results Review 134*   Potassium 3.4* 3.5 3.9  More results in Results Review 4.0   Chloride 100 100 101  More results in Results Review 103   CO2 27 23 21   More results in Results Review  18   BUN 24* 45* 77*  More results in Results Review 94*   Creatinine 2.4* 4.1* 5.9*  More results in Results Review 5.3*   Calcium 8.4 8.0 8.4  More results in Results Review 8.2   Albumin 1.8*  --   --   --  1.4*   Phosphorus 2.9 4.1 7.2*  More results in Results Review 6.5*   Magnesium 1.8 1.8 2.0  More results in Results Review 2.3   Glucose 116* 182* 150*  More results in Results Review 277*   GFR 19.3* 10.2* 6.6*  More results in Results Review 7.5*   More results in Results Review = values in this interval not displayed.           Signed by: Delice Bison, MD, MD

## 2022-07-14 NOTE — Progress Notes (Signed)
HISTORY & PHYSICAL    History and Physical:     The patient was interviewed and examined at bedside.  Patient presently in delirium but calm.  Daughter at bedside.  The previously documented history and physical on 07/04/2022 were reviewed in detail and changes are as follows     NEURO: A+Ox 0-1  HEART: RRR  LUNGS: Diminished at bases bilaterally  ABDOMEN: Soft, TTP, +BS, wound vac dressing in place.  EXTREMITIES: Warm and dry bilaterally, palpable DP pulses bilaterally, wearing prevalon boots.    ASA Classification:   []    ASA 1  Healthy patient  []    ASA 2  Mild systemic illness  [x]    ASA 3  Systemic disease, though not incapacitating  []    ASA 4  Severe systemic disease that is a constant threat to life   []    ASA 5  Moribund condition, patient unexpected to live >24 hours, irrespective of procedure  []    E         Emergent procedure    Mallampati Score:   []  1  []  2  [x]  3  []  4  []  Intubated  []  Tracheostomy    Planned Anesthesia:   []  No sedation  [x]  Local  [x]  Moderate sedation  []  Deep sedation (with Anesthesiology present)  []  General Anesthesia     Airway Assesment:   [x]  Normal  [] Compromised    Diagnosis: ESRD  Procedure: Removal of clotted off right IJ tunneled HD catheter, placement of new tunneled HD catheter    The patient is medically stable and appropriate to undergo the planned procedure. All risks and alternatives were discussed with the patient and she agrees to proceed.       Marcha Dutton, NP  CVIR Department  [facility]  [phone]       I have evaluated the patient and agree with the above documented evaluation and plan.    Signed by: Hope Pigeon, MD    [facility]  [facility], Division of Vascular & Interventional Radiology  [facility]- [phone]  [facility] - [phone]  [facility]--[id number] [facility]--[id number] [facility]--[id number]  After Hours Service [phone]

## 2022-07-14 NOTE — Progress Notes (Addendum)
S/p removal of both right IJ non tunneled HD cath and right tunneled subclavian cath.  Placed a new 14.67fr 19cm tunneled catheter via right subclavian. Biopatch and tegaderm to site, curos caps applied, CDI. Pt tolerated procedure well, VSS. Pt transferred back to room via transport with unit RN, Handover given.     fentanyl  1mg  Midazolam

## 2022-07-14 NOTE — PT Eval Note (Signed)
Physical Therapy Eval and Treatment  Shelby Barron      Post Acute Care Therapy Recommendations   Discharge Recommendations:  SNF  If recommended discharge disposition is not available, patient will require the following assistance: HH aide, Total assist, 24 hour supervision, Assist with OOB activity, Assist with I/ADLs, and HH therapy    DME needs IF patient is discharging home: No additional equipment/DME recommended at this time    Therapy discharge recommendations may change with patient status.  Please refer to most recent note for up-to-date recommendations.    Unit: [facility]  Bed: A2713/A2713-01    ___________________________________________________    Time of Evaluation and Treatment:  Time Calculation   PT Received On: 07/14/22  Start Time: 1245  Stop Time: 1315  Time Calculation (min): 30 min       Evaluation Time: 10 minutes  Treatment Time: 20 minutes    Chart Review and Collaboration with Care Team: 5 minutes, not included in above time    PT Visit Number: 1    Consult received for Shelby Barron for PT Evaluation and Treatment.  Patient's medical condition is appropriate for Physical therapy intervention at this time.    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 female admitted on 06/28/2022 with history of COPD, [name], type 2 diabetes, renal cell carcinoma  status post partial bilateral nephrectomy, [name], new diagnosis of urothelial cancer.            Patient Active Problem List   Diagnosis    Type 2 diabetes mellitus with kidney complication, with long-term current use of insulin    Diabetes mellitus type II, uncontrolled    Neuropathy of hand    Left renal mass    Hamstring injury    Peritonitis    ESRD (end stage renal disease)    Chronic diastolic congestive heart failure    Aortic atherosclerosis    Malignant neoplasm of urinary bladder, unspecified site    Malignant tumor of kidney, left    Benign essential hypertension    GERD (gastroesophageal reflux disease)    Leukocytosis    Bladder cancer    Anemia    COPD (chronic obstructive pulmonary disease)       Past Medical/Surgical History:  Past Medical History:   Diagnosis Date    Acid reflux     Asthma, well controlled     Cancer of kidney     Chronic lower back pain     Congestive heart disease     COPD (chronic obstructive pulmonary disease)     CVA (cerebral infarction) 01/12/2003    Diabetes     Diabetic nephropathy     Fibroids     Gout     H/O: gout     Hyperlipidemia     Hypertension     Neuropathy of hand  SOB (shortness of breath)      Past Surgical History:   Procedure Laterality Date    APPENDECTOMY (OPEN)      CLOSURE, ENTEROTOMY, SMALL INTESTINE  06/28/2022    Procedure: CLOSURE, ENTEROTOMY, SMALL INTESTINE;  Surgeon: Marlaine Hind, MD;  Location: [facility];  Service: General;;    CYSTECTOMY, ILEOCONDUIT N/A 06/28/2022    Procedure: CYSTECTOMY, RADICAL;  Surgeon: Neldon Newport, MD;  Location: [facility];  Service: Urology;  Laterality: N/A;    DRAIN (OTHER) N/A 05/31/2022    Procedure: DRAIN (OTHER);  Surgeon: Hope Pigeon, MD;  Location: [facility];  Service: Interventional Radiology;  Laterality: N/A;    EXPLORATORY LAPAROTOMY N/A 06/30/2022    Procedure: SMALL BOWEL ANASTOMOSIS, CLOSURE OF MESENTERIC DEFECT, CLOSURE OF ABDOMINAL WALL;  Surgeon: Marlaine Hind, MD;  Location:  [facility];  Service: General;  Laterality: N/A;  **[id number]/ MD AVAIL [id number] OR AFTER 1600**    EXPLORATORY LAPAROTOMY, RESECTION SMALL BOWEL N/A 06/28/2022    Procedure: RESECTION SMALL BOWEL;  Surgeon: Marlaine Hind, MD;  Location: [facility];  Service: General;  Laterality: N/A;    HERNIA REPAIR      HYSTERECTOMY      LAPAROTOMY, NEPHRECTOMY Bilateral 06/28/2022    Procedure: BILATERAL OPEN RADICAL NEPHRECTOMY AND LYSIS OF ASHESION;  Surgeon: Neldon Newport, MD;  Location: [facility];  Service: Urology;  Laterality: Bilateral;    PARTIAL NEPHRECTOMY      tumor removed from kidney  cJanuary 2015    TUNNELED CATH PLACEMENT (PERMCATH) N/A 06/01/2022    Procedure: TUNNELED CATH PLACEMENT;  Surgeon: Suszanne Finch, MD;  Location: [facility];  Service: Interventional Radiology;  Laterality: N/A;    TURBT      WOUND VAC APPLICATION N/A 06/28/2022    Procedure: Tawni Pummel WOUND VAC APPLICATION;  Surgeon: Marlaine Hind, MD;  Location: [facility];  Service: General;  Laterality: N/A;    WOUND VAC APPLICATION N/A 06/30/2022    Procedure: WOUND VAC APPLICATION;  Surgeon: Marlaine Hind, MD;  Location: [facility];  Service: General;  Laterality: N/A;       X-Rays/Tests/Labs:  Lab Results   Component Value Date/Time    HGB 7.7 (L) 07/14/2022 04:35 AM    HGB 8.6 (L) 06/03/2022 04:23 AM    HCT 23.5 (L) 07/14/2022 04:35 AM    HCT 26.8 (L) 06/03/2022 04:23 AM    K 3.4 (L) 07/14/2022 04:35 AM    K 3.6 06/03/2022 04:23 AM    NA 138 07/14/2022 04:35 AM    NA 137 06/03/2022 04:23 AM    INR 1.1 07/14/2022 04:35 AM    INR 1.1 06/01/2022 03:15 AM    TROPI 9.6 05/26/2022 06:37 PM    TROPI -2.2 05/26/2022 06:37 PM    TROPI 11.8 05/26/2022 03:26 PM       All imaging reviewed, please see chart for details.    Social History:  Prior Level of Function  Prior level of function: Ambulates with assistive device, Needs assistance with ADLs  Assistive Device: Four wheel walker (rollator)  Baseline Activity Level: Household  ambulation  Ambulated 100 feet or more prior to admission: Yes  Driving: does not drive  DME Currently at Home: Dan Humphreys, [employer]    Home Living Arrangements  Living Arrangements: Children (daughter)  Type of Home: House (townhouse)  Home Layout: Able to live on main level with bedroom/bathroom, Stairs to enter with rails (add number in comment) (9 STE w/  rail + 2 STE to platform w/ rail, 6 stairs to second level bedroom/bathroom)  Bathroom Shower/Tub: Pension scheme manager: Midwife: Grab bars in shower  DME Currently at Home: Environmental consultant, [employer]  Home Living - Notes / Comments: history/PLOF obtained from daughter 2/2 pt cognitive changes. Pts daughter reports that pt resides in [place] w/ husband but traveled to [place] for cancer procedure. Prior to admission, pt was ambulatory w/ rollator and able to perform stairs. Since cancer pain got worse, pt has been experiencing difficulty with her mobility. Pt has a caregiver and will have a caregiver at home as well.      Subjective:  Patient is agreeable to participation in the therapy session.  Patient Goal: Pt unable to participate in goal setting         Objective:  Observation of Patient/Vital Signs:  Vitals:    07/14/22 1348   BP: 127/68   Pulse: 68   Resp: 18   Temp:    SpO2: 99%        Inspection/Posture: pt received supine in bed with caregiver and daughter at bedside    Cognitive Status and Neuro Exam:  Cognition/Neuro Status  Arousal/Alertness: Inconsistent responses to stimuli  Attention Span: Unable to assess  Orientation Level: Disoriented to person;Disoriented to place;Disoriented to situation;Disoriented to time  Memory: Unable to assess  Following Commands: Does not follow commands  Safety Awareness: unable to assess  Insights: Not aware of deficits  Problem Solving: dependent  Behavior:  (alert to stimuli however pt unable to formulate cohesive words/scentences to communicate. A&Ox0)  Motor Planning: decreased initiation;decreased  processing speed  Coordination: GMC impaired;FMC impaired    Musculoskeletal Examination  Gross ROM  Right Upper Extremity ROM: within functional limits (PROM)  Left Upper Extremity ROM: within functional limits (PROM)  Right Lower Extremity ROM: within functional limits (PROM)  Left Lower Extremity ROM: within functional limits (PROM)  Gross Strength  Right Upper Extremity Strength: unable to assess  Left Upper Extremity Strength: unable to assess  Right Lower Extremity Strength: unable to assess  Left Lower Extremity Strength: unable to assess       Functional Mobility:  Functional Mobility  Rolling: Dependent;to Left;to Right          Participation and Activity Tolerance       Educated the Patient, Family, and Caregiver to role of physical therapy, plan of care, goals of therapy and safety with mobility and ADLs, discharge instructions, home safety with verbalized understanding  and demonstrated understanding.    Patient left in bed with alarm and all other medical equipment in place and call bell and all personal items/needs within reach.  RN notified of session outcome. Notified CM team and attending of d/c recommendation via secure chat.      Assessment:  Shelby Barron is a 82 y.o. female admitted 06/28/2022.  PT Assessment  Assessment: Decreased safety/judgement during functional mobility;Decreased cognition;Decreased endurance/activity tolerance;Decreased LE strength;Decreased UE ROM;Decreased LE ROM;Decreased UE strength;Impaired coordination;Gait impairment;Decreased functional mobility;Decreased balance;Impaired motor control  Prognosis: With continued PT status post acute discharge;Fair;Ongoing PT assessment needed  Progress: Slow progress, medical status limitations      Treatment:  Patient  presents supine in bed with daughter and caregiver at bedside. Patient is A&Ox0, inconsistently alert to stimuli and unable to formulate cohesive words/sentences to communicate. PT evaluation/examination limited  2/2 pts cognitive changes. Pt requires dependent assistance for bed mobility/transfers. RN and tech present to perform clean up 2/2 pt  experiences BM during session. PT performed PROM for b/l LE (for hip flexion/knee extension/flexion). Educated pt daughter/caregiver on PT role in acute setting and progressive mobility protocol. Discussed discharge plan for SNF with daughter/caregiver who were agreeable tot plan. Continued PT evaluation required to assess for functional progress towards goals listed below. Pt will continue to benefit from skilled PT to maximize functional goals listed below.       Plan:  Treatment/Interventions: Exercise, Gait training, Stair training, Neuromuscular re-education, Functional transfer training, LE strengthening/ROM, Endurance training, Cognitive reorientation, Patient/family training, Equipment eval/education, Bed mobility, Continued evaluation  PT Frequency: 2-3x/wk  Risks/Benefits/POC Discussed with Pt/Family: With patient    PMP Activity: Step 3 - Bed Mobility  Distance Walked (ft) (Step 6,7): 0 Feet      Goals:  Goals  Time for Goal Acheivement: By time of discharge  Pt Will Go Supine To Sit: to maximize functional mobility and independence, with moderate assist  Pt Will Sit Edge of Bed: 1-2 min, to maximize functional mobility and independence, with moderate assist  Pt Will Perform Sit to Stand: to maximize functional mobility and independence, with moderate assist  Pt Will Transfer Bed/Chair: with moderate assist, to maximize functional mobility and independence  Pt Will Ambulate: 31-50 feet, with rolling walker, with moderate assist, to maximize functional mobility and independence    Rayvon Char PT,DPT  07/14/2022  2:32 PM    Physical Therapist  Physical Medicine and Rehabilitation   [phone]        [facility]  Patient: Shelby Barron MRN#: [id number]  Unit: [facility] Bed: A2713/A2713-01

## 2022-07-14 NOTE — Plan of Care (Signed)
Problem: Moderate/High Fall Risk Score >5  Goal: Patient will remain free of falls  07/14/2022 0542 by Clearnce Sorrel, RN  Outcome: Progressing  Flowsheets (Taken 07/14/2022 0000)  High (Greater than 13):   HIGH-Consider use of low bed   HIGH-Initiate use of floor mats as appropriate   HIGH-Pharmacy to initiate evaluation and intervention per protocol   HIGH-Apply yellow "Fall Risk" arm band   HIGH-Utilize chair pad alarm for patient while in the chair   HIGH-Bed alarm on at all times while patient in bed   HIGH-Visual cue at entrance to patient's room   MOD-Place Fall Risk level on whiteboard in room   MOD-Include family in multidisciplinary POC discussions   MOD-Request PT/OT consult order for patients with gait/mobility impairment   MOD-Perform dangle, stand, walk (DSW) prior to mobilization   MOD-Utilize diversion activities   MOD-Re-orient confused patients   MOD-Use of assistive devices -Bedside Commode if appropriate   MOD-Remain with patient during toileting   MOD-Consider a move closer to Nurses Station   MOD-Use of chair-pad alarm when appropriate   LOW-Anticoagulation education for injury risk   LOW-Fall Interventions Appropriate for Low Fall Risk  07/14/2022 0541 by Clearnce Sorrel, RN  Outcome: Progressing     Problem: Compromised Sensory Perception  Goal: Sensory Perception Interventions  07/14/2022 0542 by Clearnce Sorrel, RN  Outcome: Progressing  Flowsheets (Taken 07/13/2022 2345)  Sensory Perception Interventions: Offload heels, Pad bony prominences, Reposition q 2hrs/turn Clock, Q2 hour skin assessment under devices if present  07/14/2022 0541 by Clearnce Sorrel, RN  Outcome: Progressing     Problem: Compromised Moisture  Goal: Moisture level Interventions  07/14/2022 0542 by Clearnce Sorrel, RN  Outcome: Progressing  Flowsheets (Taken 07/13/2022 2345)  Moisture level Interventions: Moisture wicking products, Moisture barrier cream  07/14/2022 0541 by Clearnce Sorrel, RN  Outcome: Progressing     Problem:  Compromised Activity/Mobility  Goal: Activity/Mobility Interventions  07/14/2022 0542 by Clearnce Sorrel, RN  Outcome: Progressing  Flowsheets (Taken 07/13/2022 2345)  Activity/Mobility Interventions: Pad bony prominences, TAP Seated positioning system when OOB, Promote PMP, Reposition q 2 hrs / turn clock, Offload heels  07/14/2022 0541 by Clearnce Sorrel, RN  Outcome: Progressing     Problem: Compromised Nutrition  Goal: Nutrition Interventions  07/14/2022 0542 by Clearnce Sorrel, RN  Outcome: Progressing  Flowsheets (Taken 07/13/2022 2345)  Nutrition Interventions: Discuss nutrition at Rounds, I&Os, Document % meal eaten, Daily weights  07/14/2022 0541 by Clearnce Sorrel, RN  Outcome: Progressing     Problem: Compromised Friction/Shear  Goal: Friction and Shear Interventions  07/14/2022 0542 by Clearnce Sorrel, RN  Outcome: Progressing  Flowsheets (Taken 07/13/2022 2345)  Friction and Shear Interventions: Pad bony prominences, Off load heels, HOB 30 degrees or less unless contraindicated, Consider: TAP seated positioning, Heel foams  07/14/2022 0541 by Clearnce Sorrel, RN  Outcome: Progressing     Problem: SCIP  Goal: SCIP measures are followed  07/14/2022 0542 by Clearnce Sorrel, RN  Outcome: Progressing  Flowsheets (Taken 07/13/2022 2311 by Kerin Salen, RN)  SCIP measures are followed:   Administer antibiotics as ordered   VTE Prevention: Administer anticoagulant(s) and/or apply anti-embolism stockings/devices as ordered   Provide VTE prophylaxis within 24 hours of anesthesia end time (Note: SCD to lower extremities)  07/14/2022 0541 by Clearnce Sorrel, RN  Outcome: Progressing     Problem: Inadequate Airway Clearance  Goal: Patent Airway maintained  07/14/2022 0542 by Clearnce Sorrel, RN  Outcome: Progressing  Flowsheets (Taken 07/13/2022 2311 by Molli Posey,  Madaline Brilliant, RN)  Patent airway maintained:   Position patient for maximum ventilatory efficiency   Provide adequate fluid intake to liquefy secretions   Suction secretions  as needed   Reposition patient every 2 hours and as needed unless able to self-reposition  07/14/2022 0541 by Clearnce Sorrel, RN  Outcome: Progressing  Goal: Normal respiratory rate/effort achieved/maintained  07/14/2022 0542 by Clearnce Sorrel, RN  Outcome: Progressing  Flowsheets (Taken 07/13/2022 0137 by Kerin Salen, RN)  Normal respiratory rate/effort achieved/maintained: Plan activities to conserve energy: plan rest periods  07/14/2022 0541 by Clearnce Sorrel, RN  Outcome: Progressing     Problem: Infection Prevention  Goal: Free from infection  07/14/2022 0542 by Clearnce Sorrel, RN  Outcome: Progressing  Flowsheets (Taken 07/13/2022 2311 by Kerin Salen, RN)  Free from infection:   Encourage/assist patient to turn, cough and perform deep breathing every 2 hours   Monitor/assess vital signs   Assess incision for evidence of healing   Monitor/assess output from surgical drain if present   Assess for signs and symptoms of infection   Monitor/assess lab values and report abnormal values  07/14/2022 0541 by Clearnce Sorrel, RN  Outcome: Progressing     Problem: Impaired Mobility  Goal: Mobility/Activity is maintained at optimal level for patient  07/14/2022 0542 by Clearnce Sorrel, RN  Outcome: Progressing  Flowsheets (Taken 07/14/2022 0542)  Mobility/activity is maintained at optimal level for patient:   Encourage independent activity per ability   Consult/collaborate with Physical Therapy and/or Occupational Therapy  07/14/2022 0541 by Clearnce Sorrel, RN  Outcome: Progressing     Problem: Constipation  Goal: Fluid and electrolyte balance are achieved/maintained  07/14/2022 0542 by Clearnce Sorrel, RN  Outcome: Progressing  Flowsheets (Taken 07/13/2022 0137 by Kerin Salen, RN)  Fluid and electrolyte balance are achieved/maintained:   Assess and reassess fluid and electrolyte status   Monitor/assess lab values and report abnormal values   Observe for cardiac arrhythmias   Monitor for muscle  weakness  07/14/2022 0541 by Clearnce Sorrel, RN  Outcome: Progressing  Goal: Elimination patterns are normal or improving  07/14/2022 0542 by Clearnce Sorrel, RN  Outcome: Progressing  Flowsheets (Taken 07/13/2022 0801 by Juel Burrow, RN)  Elimination patterns are normal or improving: Assess for and discuss C. diff screening with LIP  07/14/2022 0541 by Clearnce Sorrel, RN  Outcome: Progressing     Problem: Safety  Goal: Patient will be free from injury during hospitalization  07/14/2022 0542 by Clearnce Sorrel, RN  Outcome: Progressing  Flowsheets (Taken 07/13/2022 0137 by Kerin Salen, RN)  Patient will be free from injury during hospitalization:   Assess patient's risk for falls and implement fall prevention plan of care per policy   Provide and maintain safe environment   Ensure appropriate safety devices are available at the bedside   Hourly rounding   Assess for patients risk for elopement and implement Elopement Risk Plan per policy   Include patient/ family/ care giver in decisions related to safety   Use appropriate transfer methods  07/14/2022 0541 by Clearnce Sorrel, RN  Outcome: Progressing  Goal: Patient will be free from infection during hospitalization  07/14/2022 0542 by Clearnce Sorrel, RN  Outcome: Progressing  Flowsheets (Taken 07/12/2022 1102 by Luz Brazen, RN)  Free from Infection during hospitalization:   Assess and monitor for signs and symptoms of infection   Monitor all insertion sites (i.e. indwelling lines, tubes, urinary catheters, and drains)   Encourage patient and family to use  good hand hygiene technique   Monitor lab/diagnostic results  07/14/2022 0541 by Clearnce Sorrel, RN  Outcome: Progressing     Problem: Pain  Goal: Pain at adequate level as identified by patient  07/14/2022 0542 by Clearnce Sorrel, RN  Outcome: Progressing  Flowsheets (Taken 07/14/2022 0542)  Pain at adequate level as identified by patient:   Identify patient comfort function goal   Assess for risk of opioid induced  respiratory depression, including snoring/sleep apnea. Alert healthcare team of risk factors identified.   Assess pain on admission, during daily assessment and/or before any "as needed" intervention(s)   Reassess pain within 30-60 minutes of any procedure/intervention, per Pain Assessment, Intervention, Reassessment (AIR) Cycle   Evaluate if patient comfort function goal is met   Evaluate patient's satisfaction with pain management progress   Offer non-pharmacological pain management interventions   Consult/collaborate with Pain Service   Consult/collaborate with Physical Therapy, Occupational Therapy, and/or Speech Therapy   Include patient/patient care companion in decisions related to pain management as needed  07/14/2022 0541 by Clearnce Sorrel, RN  Outcome: Progressing     Problem: Side Effects from Pain Analgesia  Goal: Patient will experience minimal side effects of analgesic therapy  07/14/2022 0542 by Clearnce Sorrel, RN  Outcome: Progressing  Flowsheets (Taken 07/13/2022 0137 by Kerin Salen, RN)  Patient will experience minimal side effects of analgesic therapy:   Monitor/assess patient's respiratory status (RR depth, effort, breath sounds)   Assess for changes in cognitive function   Prevent/manage side effects per LIP orders (i.e. nausea, vomiting, pruritus, constipation, urinary retention, etc.)   Evaluate for opioid-induced sedation with appropriate assessment tool (i.e. POSS)  07/14/2022 0541 by Clearnce Sorrel, RN  Outcome: Progressing     Problem: Hemodynamic Status: Cardiac  Goal: Stable vital signs and fluid balance  07/14/2022 0542 by Clearnce Sorrel, RN  Outcome: Progressing  Flowsheets (Taken 07/11/2022 1104 by Luz Brazen, RN)  Stable vital signs and fluid balance:   Assess signs and symptoms associated with cardiac rhythm changes   Monitor lab values  07/14/2022 0541 by Clearnce Sorrel, RN  Outcome: Progressing     Problem: Renal Instability  Goal: Fluid and electrolyte balance are  achieved/maintained  07/14/2022 0542 by Clearnce Sorrel, RN  Outcome: Progressing  Flowsheets (Taken 07/14/2022 0542)  Fluid and electrolyte balance are achieved/maintained:   Monitor/assess lab values and report abnormal values   Assess and reassess fluid and electrolyte status   Observe for cardiac arrhythmias   Monitor for muscle weakness   Follow fluid restrictions/IV/PO parameters  07/14/2022 0541 by Clearnce Sorrel, RN  Outcome: Progressing     Problem: Patient Receiving Advanced Renal Therapies  Goal: Therapy access site remains intact  07/14/2022 0542 by Clearnce Sorrel, RN  Outcome: Progressing  Flowsheets (Taken 07/14/2022 0542)  Therapy access site remains intact:   Assess therapy access site   Change therapy access site dressing as needed  07/14/2022 0541 by Clearnce Sorrel, RN  Outcome: Progressing     Problem: Fluid and Electrolyte Imbalance/ Endocrine  Goal: Fluid and electrolyte balance are achieved/maintained  07/14/2022 0542 by Clearnce Sorrel, RN  Outcome: Progressing  Flowsheets (Taken 07/13/2022 0137 by Kerin Salen, RN)  Fluid and electrolyte balance are achieved/maintained:   Assess and reassess fluid and electrolyte status   Monitor/assess lab values and report abnormal values   Observe for cardiac arrhythmias   Monitor for muscle weakness  07/14/2022 0541 by Clearnce Sorrel, RN  Outcome: Progressing  Goal: Adequate hydration  07/14/2022  6301 by Clearnce Sorrel, RN  Outcome: Progressing  Flowsheets (Taken 07/13/2022 2311 by Kerin Salen, RN)  Adequate hydration:   Assess mucus membranes, skin color, turgor, perfusion and presence of edema   Assess for peripheral, sacral, periorbital and abdominal edema   Monitor and assess vital signs and perfusion  07/14/2022 0541 by Clearnce Sorrel, RN  Outcome: Progressing     Problem: Diabetes: Glucose Imbalance  Goal: Blood glucose stable at established goal  07/14/2022 0542 by Clearnce Sorrel, RN  Outcome: Progressing  Flowsheets (Taken 07/11/2022 1104 by  Luz Brazen, RN)  Blood glucose stable at established goal:   Monitor lab values   Monitor intake and output.  Notify LIP if urine output is < 30 mL/hour.   Include patient/family in decisions related to nutrition/dietary selections   Monitor/assess vital signs   Ensure adequate hydration   Ensure patient/family has adequate teaching materials   Ensure appropriate consults are obtained (Nutrition, Diabetes Education, and Case Management)   Assess for hypoglycemia /hyperglycemia   Ensure appropriate diet and assess tolerance   Follow fluid restrictions/IV/PO parameters   Coordinate medication administration with meals, as indicated  07/14/2022 0541 by Clearnce Sorrel, RN  Outcome: Progressing     Problem: Inadequate Gas Exchange  Goal: Patent Airway maintained  07/14/2022 0542 by Clearnce Sorrel, RN  Outcome: Progressing  Flowsheets (Taken 07/13/2022 2311 by Kerin Salen, RN)  Patent airway maintained:   Position patient for maximum ventilatory efficiency   Provide adequate fluid intake to liquefy secretions   Suction secretions as needed   Reposition patient every 2 hours and as needed unless able to self-reposition  07/14/2022 0541 by Clearnce Sorrel, RN  Outcome: Progressing  Goal: Adequate oxygenation and improved ventilation  07/14/2022 0542 by Clearnce Sorrel, RN  Outcome: Progressing  Flowsheets (Taken 07/11/2022 1104 by Luz Brazen, RN)  Adequate oxygenation and improved ventilation:   Assess lung sounds   Monitor SpO2 and treat as needed   Plan activities to conserve energy: plan rest periods   Consult/collaborate with Respiratory Therapy   Teach/reinforce use of incentive spirometer 10 times per hour while awake, cough and deep breath as needed   Provide mechanical and oxygen support to facilitate gas exchange   Monitor and treat ETCO2   Position for maximum ventilatory efficiency   Increase activity as tolerated/progressive mobility  07/14/2022 0541 by Clearnce Sorrel, RN  Outcome: Progressing      Problem: Neurological Deficit  Goal: Neurological status is stable or improving  07/14/2022 0542 by Clearnce Sorrel, RN  Outcome: Progressing  Flowsheets (Taken 07/14/2022 0542)  Neurological status is stable or improving:   Monitor/assess/document neurological assessment (Stroke: every 4 hours)   Monitor/assess NIH Stroke Scale   Re-assess NIH Stroke Scale for any change in status   Observe for seizure activity and initiate seizure precautions if indicated   Perform CAM Assessment  07/14/2022 0541 by Clearnce Sorrel, RN  Outcome: Progressing     Problem: Potential for Aspiration  Goal: Risk of aspiration will be minimized  07/14/2022 0542 by Clearnce Sorrel, RN  Outcome: Progressing  07/14/2022 0541 by Clearnce Sorrel, RN  Outcome: Progressing     Problem: Every Day - Stroke  Goal: Mobility/Activity is maintained at optimal level for patient  07/14/2022 0542 by Clearnce Sorrel, RN  Outcome: Progressing  Flowsheets (Taken 07/14/2022 0542)  Mobility/activity is maintained at optimal level for patient:   Encourage independent activity per ability   Consult/collaborate with Physical Therapy and/or Occupational Therapy  07/14/2022  1610 by Clearnce Sorrel, RN  Outcome: Progressing  Goal: Elimination patterns are normal or improving  07/14/2022 0542 by Clearnce Sorrel, RN  Outcome: Progressing  Flowsheets (Taken 07/13/2022 0801 by Juel Burrow, RN)  Elimination patterns are normal or improving: Assess for and discuss C. diff screening with LIP  07/14/2022 0541 by Clearnce Sorrel, RN  Outcome: Progressing  Goal: Neurological status is stable or improving  07/14/2022 0542 by Clearnce Sorrel, RN  Outcome: Progressing  Flowsheets (Taken 07/14/2022 0542)  Neurological status is stable or improving:   Monitor/assess/document neurological assessment (Stroke: every 4 hours)   Monitor/assess NIH Stroke Scale   Re-assess NIH Stroke Scale for any change in status   Observe for seizure activity and initiate seizure precautions if indicated   Perform CAM  Assessment  07/14/2022 0541 by Clearnce Sorrel, RN  Outcome: Progressing  Goal: Core/Quality measure requirements - Daily  Outcome: Progressing  Flowsheets (Taken 07/14/2022 0542)  Core/Quality measure requirements - Daily:   VTE Prevention: Ensure anticoagulant(s) administered and/or anti-embolism stockings/devices documented by end of day 2   Ensure antithrombotic administered or contraindication documented by LIP by end of day 2   Once lipid panel has resulted, check LDL. Contact provider for statin order if LDL > 70 (or ensure contraindication documented by LIP).   Continue stroke education (must include Modifiable Risk Factors, Warning Signs and Symptoms of Stroke, Activation of Emergency Medical System and Follow-up Appointments). Ensure handout has been given and documented.  Goal: Stable vital signs and fluid balance  Outcome: Progressing  Flowsheets (Taken 07/14/2022 0542)  Stable vital signs and fluid balance:   Position patient for maximum circulation/cardiac output   Monitor and assess vitals every 4 hours or as ordered and hemodynamic parameters   Monitor intake and output. Notify LIP if urine output is < 30 mL/hour.   Apply telemetry monitor as ordered  Goal: Patient will maintain adequate oxygenation  Outcome: Progressing  Flowsheets (Taken 07/14/2022 0542)  Patient will maintain adequate oxygenation:   Suction secretions as needed   Maintain SpO2 of greater than 92%   Sleep Apnea: Assess if previously tested or on CPAP   Sleep Apnea: Encourage patient to speak to PCP regarding sleep apnea/sleep study ([place] only)  Goal: Patient's risk of aspiration will be minimized  Outcome: Progressing  Flowsheets (Taken 07/14/2022 0542)  Patient's risk of aspiration will be minimized:   Complete new dysphagia screen for any change in status: Keep patient NPO if patient fails   Place swallow precaution signage above bed   Monitor/assess for signs of aspiration (tachypnea, cough, wheezing, clearing throat, hoarseness  after eating, decrease in SaO2   Order modified texture diet as recommend by Speech Pathologist   Keep head of bed up a minimum of 30 degrees when hemodynamically stable   Consult/Collaborate with Speech Pathologist for dysphagia   Supervise patient during oral intake  Goal: Nutritional intake is adequate  Outcome: Progressing  Flowsheets (Taken 07/14/2022 0542)  Nutritional intake is adequate:   Consult/collaborate with Clinical Nutritionist   Consult/collaborate with Speech Therapy (swallow evaluations)  Goal: Skin integrity is maintained or improved  Outcome: Progressing  Flowsheets (Taken 07/14/2022 0542)  Skin integrity is maintained or improved:   Assess Braden Scale every shift   Turn or reposition patient every 2 hours or as needed unless able to reposition self   Increase activity as tolerated/progressive mobility   Relieve pressure to bony prominences   Avoid shearing   Keep skin clean and dry   Encourage  use of lotion/moisturizer on skin   Monitor patient's hygiene practices   Collaborate with Wound, Ostomy, and Continence Nurse   Keep head of bed 30 degrees or less (unless contraindicated)  Goal: Neurovascular status is stable or improving  Outcome: Progressing  Flowsheets (Taken 07/14/2022 0542)  Neurovascular status is stable or improving:   Monitor/assess neurovascular status (pulses, capillary refill, pain, paresthesia, presence of edema)   Monitor/assess for signs of Venous Thrombus Emboli (edema of calf/thigh redness, pain)   Monitor/assess site of invasive procedure for signs of bleeding  Goal: Effective coping demonstrated  Outcome: Progressing  Flowsheets (Taken 07/14/2022 0542)  Effective coping demonstrated:   Assess/report to LIP uncontrolled anxiety, depression, or ineffective coping   Offer reassurance to decrease anxiety  Goal: Will be able to express needs and understand communication  Outcome: Progressing  Flowsheets (Taken 07/14/2022 0542)  Able to express needs and understand  communication:   Provide alternative method of communication if needed   Consult/collaborate with Speech Language Pathology (SLP)   Consult/collaborate with Case Management/Social Work   Include patient care companion in decisions related to communication   Patient/patient care companion demonstrates understanding on disease process, treatment plan, medications and discharge plan     Problem: Artificial Airway  Goal: Endotracheal tube will be maintained  07/14/2022 0542 by Clearnce Sorrel, RN  Outcome: Completed  Note: Currently extubated & ETT no longer present.  07/14/2022 0541 by Clearnce Sorrel, RN  Outcome: Progressing     Problem: Day of Admission - Stroke  Goal: Core/Quality measure requirements - Admission  Outcome: Completed  Flowsheets (Taken 07/14/2022 0542)  Core/Quality measure requirements - Admission:   Document NIH Stroke Scale on admission   Document nursing swallow/dysphagia screen on admission. If patient fails, keep patient NPO (follow your hospital protocol on swallowing screening).   VTE Prevention: Ensure anticoagulant(s) administered and/or anti-embolism stockings/devices documented as ordered   Ensure antithrombotic administered or contraindication documented by LIP   If diagnosis or history of Atrial Fib/Atrial Flutter, ensure oral anticoagulation is initiated or contraindication documented by LIP   Ensure lipid panel ordered   Begin stroke education on admission (must include Modifiable Risk Factors, Warning Signs and Symptoms of Stroke, Activation of Emergency Medical System and Follow-up Appointments) Ensure handout has been given and documented.   Ensure PT/OT and/or SLP ordered

## 2022-07-14 NOTE — SLP Progress Note (Signed)
[facility]  Speech Language Pathology Cancellation Note      Patient:  Shelby Barron MRN#:  [id number]      Patient not seen for Speech Therapy on 07/14/2022 secondary to pt  is NPO for  a procedure at noon, will re attempt later in the day if /when schedule permits.   Presley Raddle MS [facility] SLP   07/14/2022 2:13 PM  [phone]

## 2022-07-14 NOTE — Plan of Care (Signed)
NURSING SHIFT NOTE     Patient: Shelby Barron  Day: 16      SHIFT EVENTS     Shift Narrative/Significant Events (PRN med administration, fall, RRT, etc.):     Unable to assess pt orientation, unable to follow commands, intermittently responds appropriately to yes/no questions, incomprehensible speech, at times restless/agitated, LUE flaccid, +response to painful stimuli, continues to have sluggish pupillary response, no further deficits noted this shift, requires close monitoring. Pt moaning/groaning, responds "yes" to "pain", CPOT 4-5 at times, pt received PRN dilaudid/oxycodone and tylenol to good relief. No n/v. Pt running SR/Sinus arrhythmia with HR in the 60s-70s, SBPs 100s-180s, pt received PRN IV hydralazine/labetalol in the AM to good relief, afebrile. Kept pt NPO for permacath placement in the afternoon, post procedure VS/pulse checks completed, TF Nepro restarted via NDT at 53ml/hr with water flushes q4h. Pt anuric. New Permacath to R chest, dressing C/D/I, next HD tomorrow 6/27. Wound vac dressing C/D. Hourly rounds done. Call bell, telephone, and personal items within reach. Will continue to monitor.    Safety and fall precautions remain in place. Purposeful rounding completed.          ASSESSMENT     Changes in assessment from patient's baseline this shift:    Neuro: No  CV: No  Pulm: No  Peripheral Vascular: No  HEENT: No  GI: No  BM during shift: Yes 2 , Last BM: Last BM Date: 07/14/22  GU: No   Integ: No  MS: No    Pain: Improved  Pain Interventions: Medications, Rest, and Positioning  Medications Utilized: Dilaudid oral and oxycodone oral    Mobility: PMP Activity: Step 3 - Bed Mobility of Distance Walked (ft) (Step 6,7): 0 Feet           Lines     Patient Lines/Drains/Airways Status       Active Lines, Drains and Airways       Name Placement date Placement time Site Days    Permacath Catheter - Tunneled 07/14/22 Subclavian vein catheter Right 07/14/22  1404  -- less than 1    Midline IV  07/01/22 Anterior;Left Upper Arm 07/01/22  1315  Upper Arm  13    Peritoneal Dialysis Catheter Mid lower abdomen 05/26/22  2000  -- 49    Feeding Tube ND 12 Fr. Right nare 07/05/22  1130  Right nare  9                         VITAL SIGNS     Vitals:    07/14/22 1900   BP: 151/65   Pulse: 72   Resp: (!) 23   Temp:    SpO2: 98%       Temp  Min: 97.1 F (36.2 C)  Max: 98.4 F (36.9 C)  Pulse  Min: 62  Max: 84  Resp  Min: 14  Max: 75  BP  Min: 106/51  Max: 188/84  SpO2  Min: 96 %  Max: 100 %      Intake/Output Summary (Last 24 hours) at 07/14/2022 2003  Last data filed at 07/14/2022 2000  Gross per 24 hour   Intake 1206.4 ml   Output 170 ml   Net 1036.4 ml          OXYGEN WEANING     Stable to wean?: Yes  Attempted to wean?: Yes     Symptoms:     Oxygen saturation at rest maintained  at:  96-100% at 2 liters/min via nasal cannula          Oxygen saturation with ambulation maintained at:  NA% at NA          Patient self-proning?: No: NA     Patient using incentive spirometer?: No: NA   Amount achieved on incentive spirometer?: No data recorded      CARE PLAN       Problem: Moderate/High Fall Risk Score >5  Goal: Patient will remain free of falls  Outcome: Progressing  Flowsheets (Taken 07/14/2022 0800)  High (Greater than 13):   HIGH-Visual cue at entrance to patient's room   HIGH-Bed alarm on at all times while patient in bed   HIGH-Apply yellow "Fall Risk" arm band   HIGH-Initiate use of floor mats as appropriate   HIGH-Consider use of low bed     Problem: Inadequate Airway Clearance  Goal: Patent Airway maintained  Outcome: Progressing  Flowsheets (Taken 07/14/2022 1859)  Patent airway maintained:   Position patient for maximum ventilatory efficiency   Reinforce use of ordered respiratory interventions (i.e. CPAP, BiPAP, Incentive Spirometer, Acapella, etc.)   Reposition patient every 2 hours and as needed unless able to self-reposition     Problem: Impaired Mobility  Goal: Mobility/Activity is maintained at optimal  level for patient  Outcome: Progressing  Flowsheets (Taken 07/14/2022 1859)  Mobility/activity is maintained at optimal level for patient:   Encourage independent activity per ability   Consult/collaborate with Physical Therapy and/or Occupational Therapy     Problem: Constipation  Goal: Fluid and electrolyte balance are achieved/maintained  Outcome: Progressing  Flowsheets (Taken 07/14/2022 1859)  Fluid and electrolyte balance are achieved/maintained:   Monitor/assess lab values and report abnormal values   Assess and reassess fluid and electrolyte status   Monitor for muscle weakness   Observe for cardiac arrhythmias     Problem: Hemodynamic Status: Cardiac  Goal: Stable vital signs and fluid balance  Outcome: Progressing  Flowsheets (Taken 07/14/2022 1859)  Stable vital signs and fluid balance:   Assess signs and symptoms associated with cardiac rhythm changes   Monitor lab values     Problem: Renal Instability  Goal: Fluid and electrolyte balance are achieved/maintained  Outcome: Progressing  Flowsheets (Taken 07/14/2022 1859)  Fluid and electrolyte balance are achieved/maintained:   Monitor/assess lab values and report abnormal values   Follow fluid restrictions/IV/PO parameters   Assess and reassess fluid and electrolyte status   Observe for cardiac arrhythmias   Monitor for muscle weakness     Problem: Fluid and Electrolyte Imbalance/ Endocrine  Goal: Fluid and electrolyte balance are achieved/maintained  Outcome: Progressing  Flowsheets (Taken 07/14/2022 1859)  Fluid and electrolyte balance are achieved/maintained:   Monitor/assess lab values and report abnormal values   Assess and reassess fluid and electrolyte status   Monitor for muscle weakness   Observe for cardiac arrhythmias     Problem: Inadequate Gas Exchange  Goal: Patent Airway maintained  Outcome: Progressing  Flowsheets (Taken 07/14/2022 1859)  Patent airway maintained:   Position patient for maximum ventilatory efficiency   Reinforce use of ordered  respiratory interventions (i.e. CPAP, BiPAP, Incentive Spirometer, Acapella, etc.)   Reposition patient every 2 hours and as needed unless able to self-reposition     Problem: Neurological Deficit  Goal: Neurological status is stable or improving  Outcome: Progressing  Flowsheets (Taken 07/14/2022 1859)  Neurological status is stable or improving:   Monitor/assess/document neurological assessment (Stroke: every 4 hours)   Observe for  seizure activity and initiate seizure precautions if indicated   Monitor/assess NIH Stroke Scale   Perform CAM Assessment     Problem: Every Day - Stroke  Goal: Mobility/Activity is maintained at optimal level for patient  Outcome: Progressing  Flowsheets (Taken 07/14/2022 1859)  Mobility/activity is maintained at optimal level for patient:   Encourage independent activity per ability   Consult/collaborate with Physical Therapy and/or Occupational Therapy  Goal: Neurological status is stable or improving  Outcome: Progressing  Flowsheets (Taken 07/14/2022 1859)  Neurological status is stable or improving:   Monitor/assess/document neurological assessment (Stroke: every 4 hours)   Observe for seizure activity and initiate seizure precautions if indicated   Monitor/assess NIH Stroke Scale   Perform CAM Assessment  Goal: Core/Quality measure requirements - Daily  Outcome: Progressing  Flowsheets (Taken 07/14/2022 1859)  Core/Quality measure requirements - Daily:   VTE Prevention: Ensure anticoagulant(s) administered and/or anti-embolism stockings/devices documented by end of day 2   Ensure antithrombotic administered or contraindication documented by LIP by end of day 2   Once lipid panel has resulted, check LDL. Contact provider for statin order if LDL > 70 (or ensure contraindication documented by LIP).   Continue stroke education (must include Modifiable Risk Factors, Warning Signs and Symptoms of Stroke, Activation of Emergency Medical System and Follow-up Appointments). Ensure handout has  been given and documented.  Goal: Stable vital signs and fluid balance  Outcome: Progressing  Flowsheets (Taken 07/14/2022 1859)  Stable vital signs and fluid balance:   Position patient for maximum circulation/cardiac output   Monitor intake and output. Notify LIP if urine output is < 30 mL/hour.   Monitor and assess vitals every 4 hours or as ordered and hemodynamic parameters   Encourage oral fluid intake   Apply telemetry monitor as ordered  Goal: Patient's risk of aspiration will be minimized  Outcome: Progressing  Flowsheets (Taken 07/14/2022 1859)  Patient's risk of aspiration will be minimized:   Monitor/assess for signs of aspiration (tachypnea, cough, wheezing, clearing throat, hoarseness after eating, decrease in SaO2   Order modified texture diet as recommend by Speech Pathologist   Keep head of bed up a minimum of 30 degrees when hemodynamically stable

## 2022-07-15 ENCOUNTER — Encounter: Payer: Self-pay | Admitting: Interventional Radiology and Diagnostic Radiology

## 2022-07-15 ENCOUNTER — Inpatient Hospital Stay: Payer: No Typology Code available for payment source

## 2022-07-15 ENCOUNTER — Telehealth (INDEPENDENT_AMBULATORY_CARE_PROVIDER_SITE_OTHER): Payer: Self-pay

## 2022-07-15 LAB — LAB USE ONLY - CBC WITH DIFFERENTIAL
Absolute Basophils: 0.04 10*3/uL (ref 0.00–0.08)
Absolute Eosinophils: 0.04 10*3/uL (ref 0.00–0.44)
Absolute Immature Granulocytes: 0.2 10*3/uL — ABNORMAL HIGH (ref 0.00–0.07)
Absolute Lymphocytes: 1.18 10*3/uL (ref 0.42–3.22)
Absolute Monocytes: 0.94 10*3/uL — ABNORMAL HIGH (ref 0.21–0.85)
Absolute Neutrophils: 9.5 10*3/uL — ABNORMAL HIGH (ref 1.10–6.33)
Absolute nRBC: 0 10*3/uL (ref <=0.00)
Basophils %: 0.3 %
Eosinophils %: 0.3 %
Hematocrit: 24.6 % — ABNORMAL LOW (ref 34.7–43.7)
Hemoglobin: 8.1 g/dL — ABNORMAL LOW (ref 11.4–14.8)
Immature Granulocytes %: 1.7 %
Lymphocytes %: 9.9 %
MCH: 30.2 pg (ref 25.1–33.5)
MCHC: 32.9 g/dL (ref 31.5–35.8)
MCV: 91.8 fL (ref 78.0–96.0)
MPV: 11.6 fL (ref 8.9–12.5)
Monocytes %: 7.9 %
Neutrophils %: 79.9 %
Platelet Count: 236 10*3/uL (ref 142–346)
Preliminary Absolute Neutrophil Count: 9.5 10*3/uL — ABNORMAL HIGH (ref 1.10–6.33)
RBC: 2.68 10*6/uL — ABNORMAL LOW (ref 3.90–5.10)
RDW: 16 % — ABNORMAL HIGH (ref 11–15)
WBC: 11.9 10*3/uL — ABNORMAL HIGH (ref 3.10–9.50)
nRBC %: 0 /100 WBC (ref <=0.0)

## 2022-07-15 LAB — BASIC METABOLIC PANEL
Anion Gap: 14 (ref 5.0–15.0)
BUN: 44 mg/dL — ABNORMAL HIGH (ref 7–21)
CO2: 23 mEq/L (ref 17–29)
Calcium: 8.2 mg/dL (ref 7.9–10.2)
Chloride: 98 mEq/L — ABNORMAL LOW (ref 99–111)
Creatinine: 3.6 mg/dL — ABNORMAL HIGH (ref 0.4–1.0)
GFR: 11.9 mL/min/{1.73_m2} — ABNORMAL LOW (ref >=60.0)
Glucose: 145 mg/dL — ABNORMAL HIGH (ref 70–100)
Potassium: 3.7 mEq/L (ref 3.5–5.3)
Sodium: 135 mEq/L (ref 135–145)

## 2022-07-15 LAB — WHOLE BLOOD GLUCOSE POCT
Whole Blood Glucose POCT: 133 mg/dL — ABNORMAL HIGH (ref 70–100)
Whole Blood Glucose POCT: 142 mg/dL — ABNORMAL HIGH (ref 70–100)
Whole Blood Glucose POCT: 152 mg/dL — ABNORMAL HIGH (ref 70–100)
Whole Blood Glucose POCT: 154 mg/dL — ABNORMAL HIGH (ref 70–100)
Whole Blood Glucose POCT: 175 mg/dL — ABNORMAL HIGH (ref 70–100)
Whole Blood Glucose POCT: 189 mg/dL — ABNORMAL HIGH (ref 70–100)

## 2022-07-15 LAB — PHOSPHORUS: Phosphorus: 3.4 mg/dL (ref 2.3–4.7)

## 2022-07-15 LAB — MAGNESIUM: Magnesium: 1.8 mg/dL (ref 1.6–2.6)

## 2022-07-15 LAB — PROCALCITONIN: Procalcitonin: 2.05 ng/ml — ABNORMAL HIGH (ref 0.00–0.10)

## 2022-07-15 MED ORDER — SODIUM CHLORIDE 0.9 % IV BOLUS
100.0000 mL | INTRAVENOUS | Status: AC | PRN
Start: 2022-07-15 — End: 2022-07-15

## 2022-07-15 MED ORDER — SODIUM CHLORIDE 0.9 % IV BOLUS
250.0000 mL | INTRAVENOUS | Status: AC | PRN
Start: 2022-07-15 — End: 2022-07-15

## 2022-07-15 NOTE — Progress Notes (Signed)
[facility]  PROGRESS NOTE      Patient: Shelby Barron  Date:07/08/2022   LOS: 17 Days  Admission Date: 06/28/2022   MRN: [id number]  Attending: Gayleen Orem, MD  When on service as the attending, please contact me on Epic Secure Chat from 7 AM - 7 PM for non-urgent issues. For urgent matters use XTend page from 7 AM - 7 PM.       ASSESSMENT/PLAN     Shelby Barron is a 82 y.o. female admitted with Bladder cancer    Interval Summary: [name], [name], type 2 diabetes, renal cell carcinoma status post partial bilateral nephrectomy, [name], new diagnosis of urothelial cancer initally presented on 6/10 for elective cystectomy and completion nephrectomy, [name], [name], extubated and off pressors since 6/13.     She is hypoactive and delirious. Surgery recommended NGT and CTAP today. Plan to start TF today. CTH pending.  Has [name] T/Th/Sat. Nephrology following     Developed new onset afib RVR today during HD. Stopped norvasc switched to metoprolol and gave additional IV metoprolol. Consulted cardiology. Discussed with ICU requested a consult given deterioration. Recommended repeat blood cultures and broadening antibiotics to meropenem. Palliative care consulted.    Rising WBC. Blood cultures NGTD. Patient tachypneic CXR with pulm edema. Discussed with nephrology giving 3 mg IV bumex. Had dark emesis. Vitals stable. TFs held, started IV PPI, follow AM labs. Discussed with GI and surgery.    No further episodes of Afib RVR now on amiodarone gtt and PO metoprolol. Hgb stable. Repeating CXR, clear. Started robinul for upper airway secretions/rattling. Clinically improving.    New permacath placement planned for this week. Then hopefully NGT can come out if patient tolerating PO and discharge planning.    Patient Active Hospital Problem List:  #S/P Cystectomy and completion  nephrectomy complicated by small bowel enterotomy   #Mixed shock - hemorrhagic vs septic: Resolved  - 6/10 small bowel enteretomy with 1L EBL. Went to the OR on June 12 for small bowel anastomosis, closure of mesenteric defect, closure of abdominal wall with wound VAC application  - Failed SLP eval . Wcont tube feeds per nutrition recs  -Wound VAC changed 6/15  - Surgery consulted, recommend CTAP with PO contrast to eval for intra-abdominal source of infection given uptrending WBC. Surgery ordered pre-medication with steroid/benadryl given h/o contrast allergy, showed no leak or enhancing fluid collection  -Continue meropenem/micafungin per ID recommendation  -Follow-up with general surgery and ID  -Continue tube feeding  -WBC is trending down    #Acute CVA on the right frontal lobe  #History of CVA  #Encephalopathy/delirium, improving  - Hold home plavix  - Adhere to day/night cycles and administer constant reorientation.   -Left upper extremity not moving at all unclear duration  -CT head showed right frontal infarction, neurology consulted recommended to start aspirin, discussed with GI and GI cleared, discussed with daughter and daughter are agreeable.  -Pending MRI  -Neurology following along  -Discussed with neurology, recommended blood pressure target systolic between 1 20-1 60 and also recommended continue NIH stroke scale monitoring  -Stroke order set placed in  -Carotid Doppler showed plaque with no critical stenosis, MRI head pending  -Will discuss benefits risk regarding anticoagulation  -Discussed with GI cleared for anticoagulation      #Acute hypoxemic  respiratory failure  #COPD  Extubated on 6/13, currently on room air  Maintain SaO2 > 92%  Continue as needed neb treatment    #ESRD  HD Tues/Thurs/Sat  Will need eventual permcath exchange this week     #Nonfunctioning right permacath  #Right [place] IJ cath  I have reviewed the nonfunctional permacath and right [place] IJ cath and replaced with a new  permacath on 6/26.    #HTN  On metoprolol  Off nicardipine gtt, PRN IV labetalol for SBP > 180     New onset Afib with RVR  - cardiology following  - stable now on amiodarone gtt and metoprolol  -Unable to anticoagulate because of GI bleeding  -Anticoagulation once GI clears  -Discussed with cardiology for appropriate switching of the amiodarone to p.o.    #Renal cell carcinoma  #Urothelial carcinoma  #Thrombocytopenia  - VTE ppx: SCDs, subcutaneous heparin     #Diabetes Mellitus type 2  -Sliding scale insulin          Nutrition: NPO, tube feeds  Malnutrition Documentation    Moderate Malnutrition related to inadequate nutritional intake in the setting of acute illness as evidenced by <50% EER > 5 days, mild muscle losses (temporalis, deltoid) - new                 Patient has BMI=Body mass index is 35.6 kg/m.  Diagnosis: Obesity based on BMI criteria                                    Recent Labs   Lab 07/15/22  0427 07/14/22  0435 07/13/22  0348   Hemoglobin 8.1* 7.7* 8.0*   Hematocrit 24.6* 23.5* 24.1*   MCV 91.8 91.8 91.3   WBC 11.90* 12.28* 15.12*   Platelet Count 236 224 249     Recent Labs   Lab 06/02/22  0514   Iron 62   TIBC 145*   Iron Saturation 43       Code Status: Full Code    Dispo: pending improved mental status    Family Contact: Updated daughter    DVT Prophylaxis:   Current Facility-Administered Medications (Includes Only Anticoagulants, Misc. Hematological)   Medication Dose Route Last Admin   None          CHART  REVIEW & DISCUSSION     The following chart items were reviewed as of 2:58 PM on 07/15/22:  [x]  Lab Results [x]  Imaging Results   [x]  Problem List  [x]  Current Orders [x]  Current Medications  [x]  Allergies  []  Code Status []  Previous Notes   []  SDoH    The management and plan of care for this patient was discussed with the following specialty consultants:  []  Cardiology  []  Gastroenterology                 []  Infectious Disease  []  Pulmonology []  Neurology                [x]   Nephrology  []  Neurosurgery []  Orthopedic Surgery  []  Heme/Onc  []  General Surgery []  Psychiatry                                   []  Palliative    SUBJECTIVE     [name] evaluated at the bedside delirious yelling  Per daughter  mental status slightly improving daily, but still alert oriented x 0  But not able to follow command  Unable to get review of system    MEDICATIONS     Current Facility-Administered Medications   Medication Dose Route Frequency    acetaminophen  650 mg Oral 4 times per day    albuterol-ipratropium  3 mL Nebulization Q6H WA    aspirin  81 mg per NG tube Daily    glycopyrrolate  0.2 mg Intravenous Q8H    insulin glargine  7 Units Subcutaneous QAM    insulin lispro  2-10 Units Subcutaneous Q4H SCH    meropenem  500 mg Intravenous Q24H    metoprolol tartrate  12.5 mg per NG tube Q12H [facility]    micafungin  100 mg Intravenous Q24H    ondansetron  4 mg Intravenous Once    pantoprazole  40 mg Intravenous BID    sevelamer carbonate  0.8 g Oral TID MEALS    thiamine  100 mg Intravenous Q24H SCH       PHYSICAL EXAM     Vitals:    07/15/22 1445   BP: 124/63   Pulse: 82   Resp: (!) 33   Temp:    SpO2: 98%       Temperature: Temp  Min: 97.1 F (36.2 C)  Max: 98.5 F (36.9 C)  Pulse: Pulse  Min: 62  Max: 88  Respiratory: Resp  Min: 17  Max: 39  Non-Invasive BP: BP  Min: 106/51  Max: 153/68  Pulse Oximetry SpO2  Min: 96 %  Max: 100 %    Intake and Output Summary (Last 24 hours) at Date Time    Intake/Output Summary (Last 24 hours) at 07/15/2022 1458  Last data filed at 07/15/2022 0515  Gross per 24 hour   Intake 1670.6 ml   Output 50 ml   Net 1620.6 ml         GEN APPEARANCE: Normal;  A&OX0  HEENT: PERLA; EOMI; Conjunctiva Clear  NECK: Supple; No bruits  CVS: RRR, S1, S2; No M/G/R  LUNGS: CTAB; wheezing improved, no Rhonchi: No rales  ABD: Soft; No TTP; + Normoactive BS  EXT: No edema; Pulses 2+ and intact  NEURO: moving right upper extremity spontaneously, able to move lower extremities for pain but  minimal, no movement at all on the left upper extremity  MENTAL STATUS: Alert oriented x 0, able to follow simple commands, unable to get detailed exam given mental status    LABS     Recent Labs   Lab 07/15/22  0427 07/14/22  0435 07/13/22  0348   WBC 11.90* 12.28* 15.12*   RBC 2.68* 2.56* 2.64*   Hemoglobin 8.1* 7.7* 8.0*   Hematocrit 24.6* 23.5* 24.1*   MCV 91.8 91.8 91.3   Platelet Count 236 224 249       Recent Labs   Lab 07/15/22  0427 07/14/22  0435 07/13/22  0348 07/12/22  0418 07/11/22  0415   Sodium 135 138 138 138 139   Potassium 3.7 3.4* 3.5 3.9 3.6   Chloride 98* 100 100 101 103   CO2 23 27 23 21 21    BUN 44* 24* 45* 77* 58*   Creatinine 3.6* 2.4* 4.1* 5.9* 4.9*   Glucose 145* 116* 182* 150* 156*   Calcium 8.2 8.4 8.0 8.4 8.4   Magnesium 1.8 1.8 1.8 2.0 2.0       Recent Labs   Lab 07/14/22  0435  ALT 11   AST (SGOT) 18   Bilirubin, Total 0.2   Albumin 1.8*   Alkaline Phosphatase 85             Recent Labs   Lab 07/14/22  0435   INR 1.1   PT 12.4   PTT 28         Microbiology Results (last 15 days)       Procedure Component Value Units Date/Time    Culture, Blood, Aerobic And Anaerobic [[id number]] Collected: 07/07/22 0355    Order Status: Completed Specimen: Blood, Venous Updated: 07/12/22 0700     Culture Blood No growth at 5 days    Culture, Blood, Aerobic And Anaerobic [[id number]] Collected: 07/06/22 1837    Order Status: Completed Specimen: Blood, Venous Updated: 07/12/22 0000     Culture Blood No growth at 5 days    Culture, Sputum and Lower Respiratory [[id number]]     Order Status: Sent Specimen: Sputum, Induced     Culture, Sputum and Lower Respiratory [[id number]]  (Abnormal) Collected: 07/04/22 1034    Order Status: Completed Specimen: Sputum, Suctioned Updated: 07/12/22 1756     Culture Respiratory Heavy growth of Mixed upper respiratory flora      Light growth of Gram negative rod     Comment: Questionable significance due to low quantity.  No further work.        Gram Stain Few WBCs      Rare  Squamous epithelial cells      Few Mixed respiratory flora    Fungal Identification, Molds [[id number]] Collected: 07/04/22 1034    Order Status: Sent Specimen: Sputum, Suctioned Updated: 07/14/22 1251             RADIOLOGY     US Carotid Duplex Dopp Comp Bilateral    Result Date: 07/15/2022   1. Plaque formation in the right internal carotid artery bulb with elevated velocities. Stenosis is estimated at 50-69%. 2. Plaque in the left internal carotid artery with normal velocities. Any stenosis is less than 50%. Criteria for stenosis and velocity parameters are based on the guidelines of the SRU (Society of Radiologists in Ultrasound). Grant et al: Carotid Artery Stenosis: Grayscale and Doppler Ultrasound Diagnosis-- Society of Radiologists in Ultrasound Consensus Conference Ultrasound Quarterly. Volume 19, Number 4; December, 2003. Recommendations updated October 2021 Verne Carrow is an [facility] accredited facility Suszanne Finch, MD 07/15/2022 9:41 AM    Tunneled Cath Removal (Permcath)    Result Date: 07/14/2022  1. Nonfunctioning tunneled dialysis catheter as described above. 2. Successful placement of a new 19 cm Equistream catheter as described above. Dimitrios Papadouris, MD 07/14/2022 4:06 PM    Tunneled Cath Placement (Permcath)    Result Date: 07/14/2022  1. Nonfunctioning tunneled dialysis catheter as described above. 2. Successful placement of a new 19 cm Equistream catheter as described above. Dimitrios Papadouris, MD 07/14/2022 4:06 PM    MRI Brain WO Contrast    Result Date: 07/13/2022   1.Patchy areas of acute/recent infarction involving the cerebral hemispheres bilaterally. The infarcts have a configuration suggesting watershed territory infarcts. 2.Chronic ischemic changes as discussed. Georgann Housekeeper, MD 07/13/2022 10:28 PM    CT Head WO Contrast    Result Date: 07/13/2022   1.New region of loss of gray-white matter differentiation in the right frontal lobe, concerning for acute infarction. 2.A few additional areas of  poor gray-white matter differentiation for example in the left occipital lobe may be artifactual but could be correlated with MRI. 3.No evidence of  acute intracranial hemorrhage or significant mass effect. These urgent results were discussed with and acknowledged by Dr. Don Broach on 07/13/2022 3:20 PM. Vara Guardian, MD 07/13/2022 3:21 PM    XR Chest AP Portable    Result Date: 07/10/2022  Interstitial markings have slightly increased, suggesting minimal edema. No other change. Wilmon Pali, MD 07/10/2022 4:49 PM    XR Chest AP Portable    Result Date: 07/07/2022   There is mild interstitial prominence suggesting mild congestive heart failure/fluid overload in the setting of shortness of breath [name], MD 07/07/2022 12:32 PM    XR Chest AP Portable    Result Date: 07/06/2022   Hypoventilation with basilar atelectasis. Charlott Rakes, MD 07/06/2022 3:35 PM    CT Abdomen Pelvis W IV And PO Cont    Result Date: 07/06/2022  1.Postoperative changes from bilateral nephrectomy and cystectomy. 2.Small amount of ascites including some areas which appear loculated. Small collections of fluid and gas in the anterior midline abdomen may be postoperative but superimposed infection is not excluded. No rim-enhancing fluid collections or extravasation of oral contrast to suggest anastomotic leak. Demetrios Isaacs, MD 07/06/2022 5:51 AM    CT Head WO Contrast    Result Date: 07/06/2022   1.No acute intracranial abnormality is seen. Leandro Reasoner, MD 07/06/2022 3:56 AM    XR Abdomen AP    Result Date: 07/05/2022  Enteric tube terminates in the distal stomach. Elease Etienne, DO 07/05/2022 12:24 PM    XR Chest AP Portable    Result Date: 06/30/2022  1. Lines and tubes in adequate position. No pneumothorax. 2. Trace left pleural effusion and bibasilar atelectasis. Jasmine December D'Heureux, MD 06/30/2022 4:53 PM    XR Chest AP Portable    Result Date: 06/30/2022  Catheters as described. Pankaj Dominica, MD 06/30/2022 2:07 PM    XR Chest AP Portable    Result Date:  06/28/2022   Right mainstem bronchial intubation. The tube should be withdrawn about 4 cm. Dr. Tildon Husky is aware. Wynema Birch, MD 06/28/2022 4:56 PM    XR OR MISCOUNT    Result Date: 06/28/2022   No unexpected foreign body. These critical results were discussed with and acknowledged by Angie R., the circulating nurse, who will communicate the findings to Everardo All, MD on 06/28/2022 3:02 PM. Bosie Helper, MD 06/28/2022 3:03 PM   Echo Results       None          No results found for this or any previous visit.    Signed,  Gayleen Orem, MD  2:58 PM 07/15/2022

## 2022-07-15 NOTE — Plan of Care (Signed)
HD ongoing      Problem: Renal Instability  Goal: Fluid and electrolyte balance are achieved/maintained  Flowsheets (Taken 07/15/2022 1154)  Fluid and electrolyte balance are achieved/maintained:   Monitor/assess lab values and report abnormal values   Observe for cardiac arrhythmias     Problem: Patient Receiving Advanced Renal Therapies  Goal: Therapy access site remains intact  Flowsheets (Taken 07/15/2022 1154)  Therapy access site remains intact:   Assess therapy access site   Change therapy access site dressing as needed

## 2022-07-15 NOTE — Telephone Encounter (Signed)
[facility] Provider:   Kennyth Lose, PA     Requested follow up:  Please arrange outpt f/u for pt w/ UGIB, may need outpt EGD. ok to f/u 6-8 weeks with anyone.    Chart check:  Insurance coverage: [facility] MED ADV [id number]     [facility] Discharge: INPATIENT     Procedure done inpatient: none    Previous office visit with [facility] Gastroenterology ambulatory provider: none    Navigator action:      Patient is still inpatient and having procedures. Will send request to schedule once patient is discharged.

## 2022-07-15 NOTE — Plan of Care (Signed)
Problem: Compromised Nutrition  Goal: Nutrition Interventions  Outcome: Progressing  Flowsheets (Taken 07/14/2022 2000)  Nutrition Interventions: Discuss nutrition at Rounds, I&Os, Document % meal eaten, Daily weights  Note: Pt is tolerating tube feeds at goal rate.     Problem: Inadequate Gas Exchange  Goal: Adequate oxygenation and improved ventilation  Outcome: Progressing  Flowsheets (Taken 07/15/2022 0342)  Adequate oxygenation and improved ventilation:   Assess lung sounds   Monitor SpO2 and treat as needed   Position for maximum ventilatory efficiency   Plan activities to conserve energy: plan rest periods   Increase activity as tolerated/progressive mobility   Consult/collaborate with Respiratory Therapy  Note: Pt is able to cough but unable to expectorate secretions well. Oropharyngeal suction attempted, but pt tends to bite Y[place]auer. Pt is unable to follow instructions for I.S. use. Sats are adequate on 1.5L NC O2.        Problem: Every Day - Stroke  Goal: Elimination patterns are normal or improving  Outcome: Progressing  Flowsheets (Taken 07/15/2022 0342)  Elimination patterns are normal or improving: Assess for and discuss C. diff screening with LIP  Note: Pt has had one loose stool this shift.     Problem: Every Day - Stroke  Goal: Neurological status is stable or improving  Outcome: Progressing  Flowsheets (Taken 07/15/2022 0342)  Neurological status is stable or improving:   Monitor/assess/document neurological assessment (Stroke: every 4 hours)   Monitor/assess NIH Stroke Scale   Re-assess NIH Stroke Scale for any change in status   Observe for seizure activity and initiate seizure precautions if indicated   Perform CAM Assessment  Note: Pt is beginning to speak more clearly and use phrases appropriate to the situation.     Problem: Every Day - Stroke  Goal: Will be able to express needs and understand communication  Outcome: Progressing

## 2022-07-15 NOTE — UM Notes (Signed)
Continued Stay Review 07/15/22:    PATIENT NAME: Shelby Barron, Shelby Barron   DOB: 02/12/1940     Admit to Inpatient (Order #[id number]) on 06/28/22     Admit Diagnosis: Malignant neoplasm of urinary bladder, unspecified site [C67.9]  Malignant tumor of kidney, left [C64.2]  Bladder cancer [C67.9]  Facility: [facility]    Reason for Continued Stay  -82 year female admitted to inpatient at [facility] on 6/10. Patient past medical COPD, [name], type 2 diabetes, renal cell carcinoma status post partial bilateral nephrectomy, [name], new diagnosis of urothelial cancer.  She was recently admitted in May 2024 at which time she was transitioned from PD to HD.  At that time was diagnosed with urothelial cancer.  Patient presented for scheduled cystectomy and completion nephrectomy with urology. [name], [name], extubated and off pressors since 6/13.        Vital Signs  Temp 98.5  HR 79  RR 33  BP 188/84  SPO2 96% on 6L    Abnormal Labs   Latest Reference Range & Units 07/15/22 04:27   WBC 3.10 - 9.50 x10 3/uL 11.90 (H)   Hemoglobin 11.4 - 14.8 g/dL 8.1 (L)   Hematocrit 33.2 - 43.7 % 24.6 (L)   RBC 3.90 - 5.10 x10 6/uL 2.68 (L)   RDW 11 - 15 % 16 (H)   Instrument Absolute Neutrophil Count 1.10 - 6.33 x10 3/uL 9.50 (H)   Monocytes Absolute Automated 0.21 - 0.85 x10 3/uL 0.94 (H)   Immature Granulocytes Absolute 0.00 - 0.07 x10 3/uL 0.20 (H)   Neutrophils Absolute 1.10 - 6.33 x10 3/uL 9.50 (H)   Glucose 70 - 100 mg/dL 951 (H)   BUN 7 - 21 mg/dL 44 (H)   Creatinine 0.4 - 1.0 mg/dL 3.6 (H)   Chloride 99 - 111 mEq/L 98 (L)   EGFR >=60.0 mL/min/1.73 m2 11.9 (L)   Procalcitonin 0.00 - 0.10 ng/ml 2.05 (H)     Imaging  US Carotid Duplex Dopp Comp Bilateral    Result Date: 07/15/2022   1. Plaque formation in the right internal carotid artery bulb with elevated velocities. Stenosis is estimated at 50-69%.  2. Plaque in the left internal carotid artery with normal velocities. Any stenosis is less than 50%. Criteria for stenosis and velocity parameters are based on the guidelines of the SRU (Society of Radiologists in Ultrasound). Grant et al: Carotid Artery Stenosis: Grayscale and Doppler Ultrasound Diagnosis-- Society of Radiologists in Ultrasound Consensus Conference Ultrasound Quarterly. Volume 19, Number 4; December, 2003. Recommendations updated October 2021 [facility] is an [facility] accredited facility Suszanne Finch, MD 07/15/2022 9:41 AM    Plan of Care  Per Medicine  #S/P Cystectomy and completion nephrectomy complicated by small bowel enterotomy   #Mixed shock - hemorrhagic vs septic: Resolved  - 6/10 small bowel enteretomy with 1L EBL. Went to the OR on June 12 for small bowel anastomosis, closure of mesenteric defect, closure of abdominal wall with wound VAC application  - Failed SLP eval . Wcont tube feeds per nutrition recs  -Wound VAC changed 6/15  - Surgery consulted, recommend CTAP with PO contrast to eval for intra-abdominal source of infection given uptrending WBC. Surgery ordered pre-medication with steroid/benadryl given h/o contrast allergy, showed no leak or enhancing fluid collection  -Continue meropenem/micafungin per ID recommendation  -Follow-up with general surgery and ID  -  Continue tube feeding  -WBC is trending down     #Acute CVA on the right frontal lobe  #History of CVA  #Encephalopathy/delirium, improving  - Hold home plavix  - Adhere to day/night cycles and administer constant reorientation.   -Left upper extremity not moving at all unclear duration  -CT head showed right frontal infarction, neurology consulted recommended to start aspirin, discussed with GI and GI cleared, discussed with daughter and daughter are agreeable.  -Pending MRI  -Neurology following along  -Discussed with neurology, recommended blood pressure target systolic between 1 20-1 60 and also recommended continue NIH stroke  scale monitoring  -Stroke order set placed in  -Carotid Doppler showed plaque with no critical stenosis, MRI head pending  -Will discuss benefits risk regarding anticoagulation  -Discussed with GI cleared for anticoagulation        #Acute hypoxemic respiratory failure  #COPD  Extubated on 6/13, currently on room air  Maintain SaO2 > 92%  Continue as needed neb treatment     #ESRD  HD Tues/Thurs/Sat  Will need eventual permcath exchange this week      #Nonfunctioning right permacath  #Right Quinton IJ cath  I have reviewed the nonfunctional permacath and right Quinton IJ cath and replaced with a new permacath on 6/26.     #HTN  On metoprolol  Off nicardipine gtt, PRN IV labetalol for SBP > 180      New onset Afib with RVR  - cardiology following  - stable now on amiodarone gtt and metoprolol  -Unable to anticoagulate because of GI bleeding  -Anticoagulation once GI clears  -Discussed with cardiology for appropriate switching of the amiodarone to p.o.     #Renal cell carcinoma  #Urothelial carcinoma  #Thrombocytopenia  - VTE ppx: SCDs, subcutaneous heparin     #Diabetes Mellitus type 2  -Sliding scale insulin        Per Nephrology  HD today per routine       Medications  Current Facility-Administered Medications   Medication Dose Route Frequency Last Rate Last Admin    acetaminophen (TYLENOL) tablet 650 mg  650 mg Oral 4 times per day   650 mg at 07/15/22 0514    albuterol-ipratropium (DUO-NEB) 2.5-0.5(3) mg/3 mL nebulizer 3 mL  3 mL Nebulization Q6H WA   3 mL at 07/15/22 0857    albuterol-ipratropium (DUO-NEB) 2.5-0.5(3) mg/3 mL nebulizer 3 mL  3 mL Nebulization Q6H PRN        amiodarone 360 mg in dextrose 4.14% 200 mL infusion  0.5 mg/min Intravenous Continuous 16.7 mL/hr at 07/15/22 0127 0.5 mg/min at 07/15/22 0127    aspirin chewable tablet 81 mg  81 mg per NG tube Daily   81 mg at 07/15/22 0842    benzocaine (HURRICAINE) 20 % mouth spray 1 spray  1 spray Mouth/Throat TID PRN   1 spray at 07/04/22 1102    dextrose  (GLUCOSE) 40 % oral gel 15 g of glucose  15 g of glucose Oral PRN        Or    dextrose (D10W) 10% bolus 125 mL  12.5 g Intravenous PRN        Or    dextrose 50 % bolus 12.5 g  12.5 g Intravenous PRN        Or    glucagon (rDNA) (GLUCAGEN) injection 1 mg  1 mg Intramuscular PRN        diphenhydrAMINE (BENADRYL) injection 25 mg  25 mg Intravenous Q6H PRN  diphenhydrAMINE (BENADRYL) injection 25 mg  25 mg Intravenous Once PRN        glycopyrrolate (ROBINUL) injection 0.2 mg  0.2 mg Intravenous Q8H   0.2 mg at 07/15/22 0419    hydrALAZINE (APRESOLINE) injection 10 mg  10 mg Intravenous Q3H PRN   10 mg at 07/14/22 0938    HYDROmorphone (DILAUDID) tablet 1 mg  1 mg Oral Q3H PRN   1 mg at 07/15/22 0143    insulin glargine (LANTUS) injection 7 Units  7 Units Subcutaneous QAM   7 Units at 07/15/22 0842    insulin lispro injection 2-10 Units  2-10 Units Subcutaneous Q4H SCH   2 Units at 07/12/22 0025    labetalol (NORMODYNE,TRANDATE) injection 10 mg  10 mg Intravenous Q15 Min PRN   10 mg at 07/14/22 1059    lidocaine 2% urethral/mucosal jelly   Each Nare Once PRN        meropenem (MERREM) 500 mg in sodium chloride 0.9 % 100 mL mini-bag plus  500 mg Intravenous Q24H   Stopped at 07/14/22 1725    metoprolol tartrate (LOPRESSOR) half tablet 12.5 mg  12.5 mg per NG tube Q12H SCH   12.5 mg at 07/15/22 0842    micafungin (MYCAMINE) 100 mg in sodium chloride 0.9 % 100 mL IVPB  100 mg Intravenous Q24H   Stopped at 07/14/22 2300    naloxone (NARCAN) injection 0.2 mg  0.2 mg Intravenous PRN        ondansetron (ZOFRAN) injection 4 mg  4 mg Intravenous Once        oxyCODONE (ROXICODONE) immediate release tablet 5 mg  5 mg Oral Q4H PRN   5 mg at 07/15/22 0904    pantoprazole (PROTONIX) injection 40 mg  40 mg Intravenous BID   40 mg at 07/15/22 0842    phenol (CHLORASEPTIC) liquid 1 spray  1 spray Oral Q2H PRN        sevelamer carbonate (RENVELA) packet 0.8 g  0.8 g Oral TID MEALS   0.8 g at 07/15/22 0842    sodium chloride 0.9 %  bolus 100 mL  100 mL Intravenous Q1H PRN        sodium chloride 0.9 % bolus 250 mL  250 mL Intravenous PRN        thiamine (VITAMIN B-1) injection 100 mg  100 mg Intravenous Q24H SCH   100 mg at 07/15/22 7341       Primary Coverage:  Payor: MEDICARE [employer] / Plan: [employer] MED ADV [id number] / Product Type: MANAGED MEDICARE /     UTILIZATION REVIEW CONTACT: Wendall Stade RN, BSN  Utilization Review   [facility]  [phone]   [phone]  Email: [email or web address]  Tax ID:  [id number]         NOTES TO REVIEWER:    This clinical review is based on/compiled from documentation provided by the treatment team within the patient's medical record.

## 2022-07-15 NOTE — OT Eval Note (Signed)
[facility]   Occupational Therapy Attempt Note    Patient:  Shelby Barron MRN#:  [id number]  Unit:  [facility] Room/Bed:  A2713/A2713-01      Occupational Therapy evaluation attempted on 07/15/2022 at 11:33 AM.    OT Visit Cancellation Reason: Testing/Procedure (Pt undergoing dialysis in room.)          Occupational therapy will follow up at a later time/date as able.    RN aware.      Tacey Ruiz, CBIS  [phone]  Physical Medicine and Rehabilitation  [facility]    07/15/2022  11:33 AM

## 2022-07-15 NOTE — Progress Notes (Signed)
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 s/p bilateral radical nephrectomies and  cystectomy on 06/28/22  - complicated by small bowel enterotomy and blood loss   New onset afib - on amio gtt + metoprolol  High grade urothelial bladder CA: s/p bilateral nephrectomies and cystectomy c/b mall bowel enterotomy and blood loss (~1L) on 06/28/22  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 (approx 1L) - remains low  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  Encephalopathy - persists  Leukocytosis - improving - on meropenem and micafungin  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     Recommendations:     HD today per routine      Case discussed with: pt, daughter, RN    Alla German, MD  [facility]  [phone] (office)  X [phone] (FFX Spectra-Link)      Subjective: Opens her eyes and speaks a few words - non purposeful       Review of Systems:   Unable to assess, patient is confused     Physical Exam:     Vitals:    07/15/22 0000 07/15/22 0140 07/15/22 0400 07/15/22 0510   BP: 115/53  113/53    Pulse: 72 71 73 75   Resp: (!) 23 (!) 24 21 22    Temp: 98.4 F (36.9 C)  98.5 F (36.9 C)    TempSrc: Axillary  Axillary    SpO2: 96% 97% 98% 96%   Weight:   88.3 kg (194 lb 10.7 oz)    Height:           Intake and Output Summary (Last 24 hours) at Date Time    Intake/Output Summary (Last 24 hours) at 07/15/2022 0538  Last data filed at 07/15/2022 0515  Gross per 24 hour   Intake 1908.14 ml   Output  50 ml   Net 1858.14 ml       General: confused, on NC, NGT in place   Cardiovascular: regular rate and rhythm  Lungs: bilateral air entry  Abdomen: soft  Extremities: LUE edema  Access: R quinton, old RIJ permacath    Meds:      Scheduled Meds: PRN Meds:    acetaminophen, 650 mg, Oral, 4 times per day  albuterol-ipratropium, 3 mL, Nebulization, Q6H WA  aspirin, 81 mg, per NG tube, Daily  glycopyrrolate, 0.2 mg, Intravenous, Q8H  insulin glargine, 7 Units, Subcutaneous, QAM  insulin lispro, 2-10 Units, Subcutaneous, Q4H SCH  meropenem, 500 mg, Intravenous, Q24H  metoprolol tartrate, 12.5 mg, per NG tube, Q12H SCH  micafungin, 100 mg, Intravenous, Q24H  ondansetron, 4 mg, Intravenous, Once  pantoprazole, 40 mg, Intravenous, BID  sevelamer carbonate, 0.8 g, Oral, TID MEALS  thiamine, 100 mg, Intravenous, Q24H SCH          Continuous Infusions:   amiodarone 0.5 mg/min (07/15/22 0127)      albuterol-ipratropium, 3 mL, Q6H PRN  benzocaine, 1 spray,  TID PRN  dextrose, 15 g of glucose, PRN   Or  dextrose, 12.5 g, PRN   Or  dextrose, 12.5 g, PRN   Or  glucagon (rDNA), 1 mg, PRN  diphenhydrAMINE, 25 mg, Q6H PRN  diphenhydrAMINE, 25 mg, Once PRN  hydrALAZINE, 10 mg, Q3H PRN  HYDROmorphone, 1 mg, Q3H PRN  labetalol, 10 mg, Q15 Min PRN  lidocaine 2% jelly, , Once PRN  naloxone, 0.2 mg, PRN  oxyCODONE, 5 mg, Q4H PRN  phenol, 1 spray, Q2H PRN              Labs:     Recent Labs   Lab 07/15/22  0427 07/14/22  0435 07/13/22  0348   WBC 11.90* 12.28* 15.12*   Hemoglobin 8.1* 7.7* 8.0*   Hematocrit 24.6* 23.5* 24.1*   Platelet Count 236 224 249     Recent Labs   Lab 07/15/22  0427 07/14/22  0435 07/13/22  0348   Sodium 135 138 138   Potassium 3.7 3.4* 3.5   Chloride 98* 100 100   CO2 23 27 23    BUN 44* 24* 45*   Creatinine 3.6* 2.4* 4.1*   Calcium 8.2 8.4 8.0   Albumin  --  1.8*  --    Phosphorus 3.4 2.9 4.1   Magnesium 1.8 1.8 1.8   Glucose 145* 116* 182*   GFR 11.9* 19.3* 10.2*           Signed by: Alla German, MD, MD

## 2022-07-15 NOTE — Respiratory Progress Note (Signed)
Respiratory Therapy                              Patient Re-Assessment Note/Protocol Order Changes    Patient has been assessed and re-evaluated for the follow therapies:       Respiratory Orders   (From admission, onward)                 Start     Ordered    07/11/22 0800  Nebulizer treatment intermittent  Every 6 hours while awake (RT)      Comments:   All Adult patients ordered for Respiratory Therapy, i.e., inhaled meds, secretion clearance/lung expansion or Oxygen greater than 5 liters/min will be evaluated by a Respiratory Therapist and assessed per Respiratory Therapy Patient Driven Protocol.  Initial assessment and changes made per protocol can be found in the progress note section of the patient chart.    07/10/22 2322    07/07/22 2000  Resp Re-Assess Adult (RT Use Only)  2 times daily (RT)       07/07/22 1729    Unscheduled  Suctioning  As needed      Question:  Suction Type  Answer:  Nasal Pharyngeal    07/06/22 0134                  IP Meds - Nasal and Inhaled (From admission, onward)      Start     Stop Status Route Frequency Ordered    07/11/22 0800  albuterol-ipratropium (DUO-NEB) 2.5-0.5(3) mg/3 mL nebulizer 3 mL         -- Dispensed NEBULIZATION RT - Every 6 hours while awake 07/10/22 2321    07/10/22 2321  albuterol-ipratropium (DUO-NEB) 2.5-0.5(3) mg/3 mL nebulizer 3 mL         -- Verified NEBULIZATION RT - Every 6 hours as needed 07/10/22 2321                 Current Criteria For Therapy  Secretion Clearance: None indicated  Lung Expansion: None indicated  Medications: Hx of COPD, Asthma, Bronchitis or other documented RAD    Expected Outcomes  Secretion: Improved breath sound       Meds: Decreased freq of symptoms    Outcomes Met  Secretion: No     Meds: No       Reassessment Recommendations  Recommendations: Continue current treatment plan    Patient's orders have been modified as follows per RT Patient Driven Protocol.    <free  text>    If any questions, please contact the Respiratory Therapist assigned to this patient    Thank you

## 2022-07-15 NOTE — Treatment Plan (Cosign Needed)
FOUR EYES SKIN ASSESSMENT NOTE    Shelby Barron  1940/10/04  [id number]    Braden Scale Score: 11    POC Initiated for Risk for Altered Skin Yes    Patient Assessed for Correct Mattress Surface Yes  *At risk patients with Braden Score less than 12 must be considered for specialty bed    Mepilex or Adhesive Foam Dressing applied to sacrum/heel if any PI risk factors present: Yes    If Wound/Pressure Injury present:    Wound/PI assessment documented in EHR: Yes    Admitting physician notified: Yes    Wound consult ordered: Yes    Loel Ro, RN  July 15, 2022  6:38 PM    Second RN/PCT Name:

## 2022-07-15 NOTE — Plan of Care (Signed)
Problem: Moderate/High Fall Risk Score >5  Goal: Patient will remain free of falls  Outcome: Progressing  Flowsheets (Taken 07/15/2022 1000)  High (Greater than 13):   HIGH-Visual cue at entrance to patient's room   HIGH-Bed alarm on at all times while patient in bed   HIGH-Utilize chair pad alarm for patient while in the chair   HIGH-Apply yellow "Fall Risk" arm band   HIGH-Pharmacy to initiate evaluation and intervention per protocol   HIGH-Initiate use of floor mats as appropriate     Problem: Compromised Sensory Perception  Goal: Sensory Perception Interventions  Outcome: Progressing  Flowsheets (Taken 07/15/2022 1159)  Sensory Perception Interventions: Offload heels, Pad bony prominences, Reposition q 2hrs/turn Clock, Q2 hour skin assessment under devices if present     Problem: Compromised Moisture  Goal: Moisture level Interventions  Outcome: Progressing  Flowsheets (Taken 07/15/2022 1159)  Moisture level Interventions: Moisture wicking products, Moisture barrier cream     Problem: Compromised Activity/Mobility  Goal: Activity/Mobility Interventions  Outcome: Progressing  Flowsheets (Taken 07/15/2022 1159)  Activity/Mobility Interventions: Pad bony prominences, TAP Seated positioning system when OOB, Promote PMP, Reposition q 2 hrs / turn clock, Offload heels     Problem: Compromised Nutrition  Goal: Nutrition Interventions  Outcome: Progressing  Flowsheets (Taken 07/15/2022 1159)  Nutrition Interventions: Discuss nutrition at Rounds, I&Os, Document % meal eaten, Daily weights     Problem: Compromised Friction/Shear  Goal: Friction and Shear Interventions  Outcome: Progressing  Flowsheets (Taken 07/15/2022 1159)  Friction and Shear Interventions: Pad bony prominences, Off load heels, HOB 30 degrees or less unless contraindicated, Consider: TAP seated positioning, Heel foams     Problem: Impaired Mobility  Goal: Mobility/Activity is maintained at optimal level for patient  Outcome: Progressing  Flowsheets (Taken  07/15/2022 1159)  Mobility/activity is maintained at optimal level for patient:   Encourage independent activity per ability   Consult/collaborate with Physical Therapy and/or Occupational Therapy     Problem: Constipation  Goal: Fluid and electrolyte balance are achieved/maintained  Outcome: Progressing  Flowsheets (Taken 07/15/2022 1159)  Fluid and electrolyte balance are achieved/maintained:   Monitor/assess lab values and report abnormal values   Assess and reassess fluid and electrolyte status   Observe for cardiac arrhythmias   Monitor for muscle weakness     Problem: Safety  Goal: Patient will be free from injury during hospitalization  Outcome: Progressing  Flowsheets (Taken 07/15/2022 1159)  Patient will be free from injury during hospitalization:   Assess patient's risk for falls and implement fall prevention plan of care per policy   Provide and maintain safe environment   Use appropriate transfer methods   Hourly rounding   Include patient/ family/ care giver in decisions related to safety   Ensure appropriate safety devices are available at the bedside   Assess for patients risk for elopement and implement Elopement Risk Plan per policy  Goal: Patient will be free from infection during hospitalization  Outcome: Progressing  Flowsheets (Taken 07/15/2022 1159)  Free from Infection during hospitalization:   Assess and monitor for signs and symptoms of infection   Encourage patient and family to use good hand hygiene technique   Monitor lab/diagnostic results   Monitor all insertion sites (i.e. indwelling lines, tubes, urinary catheters, and drains)     Problem: Pain  Goal: Pain at adequate level as identified by patient  Outcome: Progressing  Flowsheets (Taken 07/15/2022 1159)  Pain at adequate level as identified by patient:   Assess for risk of opioid induced  respiratory depression, including snoring/sleep apnea. Alert healthcare team of risk factors identified.   Identify patient comfort function goal    Assess pain on admission, during daily assessment and/or before any "as needed" intervention(s)   Reassess pain within 30-60 minutes of any procedure/intervention, per Pain Assessment, Intervention, Reassessment (AIR) Cycle   Evaluate patient's satisfaction with pain management progress   Evaluate if patient comfort function goal is met   Offer non-pharmacological pain management interventions   Consult/collaborate with Physical Therapy, Occupational Therapy, and/or Speech Therapy   Include patient/patient care companion in decisions related to pain management as needed     Problem: Fluid and Electrolyte Imbalance/ Endocrine  Goal: Fluid and electrolyte balance are achieved/maintained  Outcome: Progressing  Flowsheets (Taken 07/15/2022 1159)  Fluid and electrolyte balance are achieved/maintained:   Monitor/assess lab values and report abnormal values   Assess and reassess fluid and electrolyte status   Observe for cardiac arrhythmias   Monitor for muscle weakness     Problem: Neurological Deficit  Goal: Neurological status is stable or improving  Outcome: Progressing  Flowsheets (Taken 07/15/2022 1159)  Neurological status is stable or improving:   Monitor/assess/document neurological assessment (Stroke: every 4 hours)   Observe for seizure activity and initiate seizure precautions if indicated   Perform CAM Assessment   Monitor/assess NIH Stroke Scale   Re-assess NIH Stroke Scale for any change in status     Problem: Every Day - Stroke  Goal: Mobility/Activity is maintained at optimal level for patient  Outcome: Progressing  Flowsheets (Taken 07/15/2022 1159)  Mobility/activity is maintained at optimal level for patient:   Encourage independent activity per ability   Consult/collaborate with Physical Therapy and/or Occupational Therapy  Goal: Neurological status is stable or improving  Outcome: Progressing  Flowsheets (Taken 07/15/2022 1159)  Neurological status is stable or improving:   Monitor/assess/document  neurological assessment (Stroke: every 4 hours)   Observe for seizure activity and initiate seizure precautions if indicated   Perform CAM Assessment   Monitor/assess NIH Stroke Scale   Re-assess NIH Stroke Scale for any change in status  Goal: Core/Quality measure requirements - Daily  Outcome: Progressing  Flowsheets (Taken 07/15/2022 1159)  Core/Quality measure requirements - Daily:   VTE Prevention: Ensure anticoagulant(s) administered and/or anti-embolism stockings/devices documented by end of day 2   Ensure antithrombotic administered or contraindication documented by LIP by end of day 2   Once lipid panel has resulted, check LDL. Contact provider for statin order if LDL > 70 (or ensure contraindication documented by LIP).   Continue stroke education (must include Modifiable Risk Factors, Warning Signs and Symptoms of Stroke, Activation of Emergency Medical System and Follow-up Appointments). Ensure handout has been given and documented.  Goal: Skin integrity is maintained or improved  Outcome: Progressing  Flowsheets (Taken 07/15/2022 1159)  Skin integrity is maintained or improved:   Assess Braden Scale every shift   Turn or reposition patient every 2 hours or as needed unless able to reposition self   Increase activity as tolerated/progressive mobility   Monitor patient's hygiene practices   Encourage use of lotion/moisturizer on skin   Keep skin clean and dry   Avoid shearing   Relieve pressure to bony prominences   Collaborate with Wound, Ostomy, and Continence Nurse   Keep head of bed 30 degrees or less (unless contraindicated)  Goal: Effective coping demonstrated  Outcome: Progressing  Flowsheets (Taken 07/15/2022 1159)  Effective coping demonstrated:   Assess/report to LIP uncontrolled anxiety, depression, or ineffective coping  Offer reassurance to decrease anxiety     Problem: Pain interferes with ability to perform ADL  Goal: Pain at adequate level as identified by patient  Outcome:  Progressing  Flowsheets (Taken 07/15/2022 1159)  Pain at adequate level as identified by patient:   Assess for risk of opioid induced respiratory depression, including snoring/sleep apnea. Alert healthcare team of risk factors identified.   Identify patient comfort function goal   Assess pain on admission, during daily assessment and/or before any "as needed" intervention(s)   Reassess pain within 30-60 minutes of any procedure/intervention, per Pain Assessment, Intervention, Reassessment (AIR) Cycle   Evaluate patient's satisfaction with pain management progress   Evaluate if patient comfort function goal is met   Offer non-pharmacological pain management interventions   Consult/collaborate with Physical Therapy, Occupational Therapy, and/or Speech Therapy   Include patient/patient care companion in decisions related to pain management as needed

## 2022-07-15 NOTE — Plan of Care (Signed)
GI PLAN OF CARE    Dr. Don Broach requested documentation regarding GI clearance to start [facility].    Reviewed hospitalization/stay, prior GI notes.    Initially presented to the hospital on 06/28/2022 for elective cystectomy and complete nephrectomy for management of new urothelial cancer. OR course c/b small bowel enterotomy and hemorrhagic shock. Return to OR for Abdominal wall closure 6/12.     Pt initially seen 6/20 and last seen 6/23.  She was evaluated for coffee grounds in oral cavity and melena in rectal tube.    No procedures were performed due to high risk for procedures, she was managed medically with BID PPI.  Concern for stress ulcer, esophagitis, +/- anastomotic/surgical bleeding.    As of today, she has not experienced any further bleeding.  Communicated with RN.    Her hemorrhagic shock was not of GI origin but rather was related to her surgery (and earlier in her stay prior to GI evaluation). Hgb stabilized around 8 range.    From a GI perspective, she can start [facility], but would continue her on BID PPI therapy.  Also would ensure surgical team is ok with her starting [facility].    I will arrange f/u in the outpt setting, RN navigator contacted and AVS updated  .  Consideration for outpt EGD depending on pt's status at that time.    Roque Cash, PA  4:04 PM

## 2022-07-15 NOTE — Progress Notes (Signed)
Arrived pt room, pt alert does not follow command but sometimes reply to  Question, daughter at bedside, Started HD via Right perm CVC, working well, report was received from primary nurse.       07/15/22 1129   Bedside Nurse Communication   Name of bedside RN - pre dialysis Loel Ro, RN   Treatment Initiation- With Dialysis Precautions   Time Out/Safety Check Completed Yes   Consent for HD signed for this hospitalization (Date) 06/01/22   Consent for HD signed for this hospitalization (Time) 1516   Preferred language No   Blood Consent Verified N/A   Dialysis Precautions All Connections Secured   Dialysis Treatment Type Routine   Is patient diabetic? Yes   RO/Hemodialysis Cabin crew   Is Total Chlorine less than 0.1 ppm? Yes   Orignial Total Chlorine Testing Time 1030   Hepatitis Status   HBsAg (Antigen) Result Negative   HBsAg Date Drawn 07/03/22   Dialysis Weight   Pre-Treatment Weight (Kg) 88   Vitals   Temp 97.8 F (36.6 C)   Heart Rate 70   BP 140/66   Assessment   Mental Status Does not follow commands   Cardiac (WDL) WDL   Cardiac Regularity Return to [facility]   Cardiac Symptoms Return to [facility]   Cardiac Rhythm Normal sinus rhythm   Respiratory  WDL   Bilateral Breath Sounds Diminished   R Breath Sounds Diminished   L Breath Sounds Diminished   Edema  WDL   Generalized Edema +1   RLE Edema Non Pitting Edema   LLE Edema Non Pitting Edema   General Skin Color Appropriate for ethnicity   Skin Condition/Temp Return to [facility]   Abdomen Inspection Rounded   Mobility Bed   Permacath Catheter - Tunneled 07/14/22 Subclavian vein catheter Right   Placement Date/Time: 07/14/22 1404   Inserted by: Dr. Welton Flakes  Access Type: Subclavian vein catheter  Orientation: Right  Central Line Infection Prevention Education provided?: Yes  Hand Hygiene: Alcohol based hand scrub;Soap and water  Line cart u...   Line necessity reviewed? Apheresis/hemodialysis   Site Assessment Clean;Dry;Intact   Catheter Lumen Volume Venous  1.6 mL   Catheter Lumen Volume Arterial 1.6 mL   Dressing Status and Intervention Dressing Intact   Tego/Curos Caps on Catheter Yes   NEW Tego/Curos Caps placed (Date) 07/15/22   APHERESIS ONLY:  Access  Red   APHERESIS ONLY: Return Blue   End Caps Free From Blood Yes   Line Care Connections checked and tightened   Dressing Type Transparent;Biopatch   Dressing Status Clean;Dry;Intact   Dressing/Line Intervention Caps changed   Line Used For Blood Draw No   Dressing Change Due 07/21/22   Pain Assessment   Charting Type Assessment   Pain Scale Used Numeric Scale (0-10)   Numeric Pain Scale   Pain Score 3   POSS Score 2   Hemodialysis Comments   Pre-Hemodialysis Comments safety check done, timeotus done

## 2022-07-15 NOTE — Progress Notes (Signed)
Nursing Progress Note         Major Shift Events: Wound vac dressing changed by wound care RN.  Dialysis complete. 2L removed.       Review of Systems  Neuro: Alert. Oriented to person. Does not consistently follow commands. RUE moves spontaneously. LUE flaccid. BLE respond to pain. Pupils equal, sluggish.       Cardiac: NSR. SBP goal 120-160. Afebrile. LUE edema. Pulses palpable.       Respiratory: 1L NC. Lungs diminished.       GI/GU: NDT R nare @ 78 cm. Promote @ 40 ml (goal) w/ 100 ml H2O flushes q4h. Prosource BID. LBM 06/26.  Female external catheter in place.       BM this shift? N     Skin Assessment: Open abdomen with wound vac in place. Blanchable redness to sacrum.          LDAs  Midline L upper arm. 20G R hand.

## 2022-07-15 NOTE — Progress Notes (Signed)
Nutritional Support Services  Nutrition Follow-up    Shelby Barron 82 y.o. female   MRN: [id number]    Summary of Nutrition Recommendations:    Continue Nepro via NDT @ 40 ml/hr + 2 pkg Prosource + 100 ml FW flushes Q4H  This rate at goal will provide 1888 kcal, 118 g protein, 698 ml fw (1298 ml w/ flushes) which meets 100% of kcal and protein needs  Monitor PO status/SLP assessments  Daily weights, standing scale if able     -----------------------------------------------------------------------------------------------------------------    D/w RN                                                       ASSESSMENT DATA     Subjective Nutrition: Pt seen at bedside, receiving wound care. Spoke with RN, no issues with tube feeds, has been tolerating rate at goal. See pump hx below. GI abd rounded, hyperactive bowel sounds. LBM 6/26.     Medical Hx:  has a past medical history of Acid reflux, Asthma, well controlled, Cancer of kidney, Chronic lower back pain, Congestive heart disease, COPD (chronic obstructive pulmonary disease), CVA (cerebral infarction) (01/12/2003), Diabetes, Diabetic nephropathy, Fibroids, Gout, H/O: gout, Hyperlipidemia, Hypertension, Neuropathy of hand, and SOB (shortness of breath).       Orders Placed This Encounter   Procedures    Diet NPO time specified - Surgery/Procedure    Tube feeding-Continuous     Enteral: Limited pump hx available in room   Observed TF infusing at goal rate of 40 ml/hr. Per pump hx, pt received 781 ml TF x 24 hrs, 820 ml TF x 48 hrs meeting 62% goal volume x 24, 48 hrs      ANTHROPOMETRIC  Height: 157.5 cm (5\' 2" )  Weight: 88.3 kg (194 lb 10.7 oz)  Body mass index is 35.6 kg/m.     ESTIMATED NEEDS    Total Daily Energy Needs: 1566 to 2001 kcal  Method for Calculating Energy Needs: 18 kcal - 23 kcal per kg  at 87 kg (Actual body weight)  Rationale: obese, noncritical, HD       Total Daily Protein Needs: 104.4 to 130.5 g  Method for Calculating Protein Needs: 2 g - 2.5 g  per kg at 52.2 kg (Ideal body weight)  Rationale: obese, noncritical, HD      Total Daily Fluid Needs: 1044 to 1305 ml  Method for Calculating Fluid Needs: 20 ml - 25 ml  per kg at 52.2 kg (Ideal body weight)  Rationale: bmi, age    Pertinent Medications:   Lantus, SSI, PPI, thiamine   IVF:     amiodarone 0.5 mg/min (07/15/22 0127)       Pertinent labs:  Recent Labs   Lab 07/15/22  0427 07/14/22  0435 07/13/22  0348 07/12/22  0418 07/11/22  0415   Sodium 135 138 138 138 139   Potassium 3.7 3.4* 3.5 3.9 3.6   Chloride 98* 100 100 101 103   CO2 23 27 23 21 21    BUN 44* 24* 45* 77* 58*   Creatinine 3.6* 2.4* 4.1* 5.9* 4.9*   Glucose 145* 116* 182* 150* 156*   Calcium 8.2 8.4 8.0 8.4 8.4   Magnesium 1.8 1.8 1.8 2.0 2.0   Phosphorus 3.4 2.9 4.1 7.2* 6.5*   GFR 11.9* 19.3*  10.2* 6.6* 8.2*   WBC 11.90* 12.28* 15.12* 17.81* 20.96*   Hematocrit 24.6* 23.5* 24.1* 24.8* 27.3*   Hemoglobin 8.1* 7.7* 8.0* 8.5* 9.4*   Triglycerides  --  185*  --   --   --    Hemoglobin A1C  --  5.6  --   --   --                                                       MONITORING/EVALUATION     2. Patient will meet >80% of nutritional needs via tube feeding by next RD follow up  - active    Nutrition Risk Level: High (will follow up at least 2 times per week and PRN)     Marisa Sprinkles, RD, CNSC  Dietitian Clinical Specialist I  [employer] [phone]

## 2022-07-15 NOTE — Progress Notes (Signed)
Wound Ostomy Continence Progress Note    Date Time: 07/15/22 11:43 AM  Patient Name: Shelby Barron, Shelby Barron  Consulting Service: [facility] Day: 18        Assessment   Pertinent skin and/or ostomy history includes  Acid reflux, Asthma, well controlled, Cancer of kidney, Chronic lower back pain, Congestive heart disease, COPD (chronic obstructive pulmonary disease), CVA (cerebral infarction) (01/12/2003), Diabetes, Diabetic nephropathy, Fibroids, Gout, H/O: gout, Hyperlipidemia, Hypertension, Neuropathy of hand, and SOB (shortness of breath)..  Admitted with Bladder cancer.       Assessment:     Wound Type and Location:  Surgical Wound Mid Abdomen  Dimensions: 27 cm x 6.5 cm x 6 cm   Wound Bed:  Full thickness tissue loss with beefy red granulation tissue along wound edges.  Granulation tissue 50% Nongranulation tissue 25% Slough 25%  Undermining: no  Tunneling:  no  Peri Wound / Edges:  macerated and denuded  between 6 and 9 O'clock peri wound, noted some hyperpigmentation  along wound edge, partial thickness skin loss   Drainage:  sero sanguinous   Pain:  yes has prn pain meds  Odor:  none    Wound care to mid abdomen/ Vac Dressing Change     Area cleansed with wound cleanser and  patted dry.    Applied  No-Sting skin barrier film to peri-wound and allowed to dry.   Placed Vac drape around peri-wound window paned style, to prevent maceration and ap[plied Eakin's ring around umbilicus, top of wound and along the pannus and  curvature of pannus for more adherence.    Patient has small wound with partial thickness skin loss from vac drape between 6 and 9 O'clock peri wound.   Area cleansed with normal saline.  Applied no sting barrier film and several two pieces of Maxorb over wound.  Secured with vac drape.    Packed two pieces of silver non adherent dressing and top with  two pieces of white foam and 3 pieces of of black foam into a wound  Secured with vac drape.   A quarter size cut hole was cut into the black  foam to fit the trackpad.   Good seal obtained and wound vacuum setting at 125 mm Hg, low intensity continuous.  An aseptic technique utilized seemed to tolerate it well without any pain medication.   9.   Changed Vac Cannister.  Plan   Plan:   Continue pressure prevention bundle:     Head of the bed 30 degrees or less  Positioning device to the bedside  Eliminate/minimize pressure from the area  Float heels with boots or pillows  Turn patient  Pressure redistribution cushion to the chair  Use lift sheets/low friction surface sheets for positioning.  Pad bony prominences  Nutrition consult/Optimize nutrition  Initiate bed algorithm/Specialty bed  Moisture/Incontinence management - Cleanse with incontinence cleansing wipe/water to manage incontinence and protect skin from exposure to urine and stool. Apply skin barrier protection cream. Apply [place]/external or female external urine management system to prevent urinary contamination of a wound. Apply rectal pouch/fecal management system per unit policy, to prevent fecal contamination of a wound or contain diarrhea.           1.  Wound care orders entered into EMR.   2.  Care rendered.  3.  RN checks wound VAC Q 2 hr: Sponge contracted;  pump "ON" and plugged in.    4.  If unable to maintain the seal, apply Tegaderm  dressing or vac drape to seal. May also contact [facility] at 1 - [phone] to troubleshoot.  5.  If still unable to maintain the seal and vac off or not working for 2 hours, remove all foam and place hydrogel moistened gauze into wound bed, cover with ABD pad secured with tape, and notify Physician and WOC team.          6.  Change two-to three times a week. Next vac change due on Monday.     When patient discharged or VAC discontinued -   Remove all foam dressing and non adherent dressing and place hydrogel moistened gauze into the wound bed, cover with ABD pad tape to secure.   Wound Vac machine - discard canister and tubing.    Place wound vac in CLEAR  plastic bag and place in the soiled utility room. DO NOT USE RED BIOHAZARD BAGS.    Please contact WOC office [phone] and leave a message for WOC to pick up.      Discharge VAC Wound Information     Prescribed [facility] VAC Therapy for duration:  4 months      Discharge VAC Wound Information  Surgical   mid abdomen  Age of wound: 06/28/22  Did VAC initiate at the hospital?     yes         Initiated - 06/28/22  Compromised Nutritional status? no Nutrition consulted for wound care needs.  Other therapies previously tried: no.  Is there eschar present in the wound: no  Has wound been debrided in the last ten days:   no  Are serial debridements required: no  Location: mid abdomen  Measurements: Date- 07/15/22 see above                            Length- 27  cm                            Width- 6.5 cm                            Depth-  6 cm                            Undermining- no  cm                            Tunneling-  no cm  The appearance of wound bed and color: red, pink, brown  Exudate: serosanguinous  Is wound full-thickness: yes  Is bone, tendon or muscle exposed: yes  Is there undermining no  Is there tunneling no  Type of Foam used: black simplace and white foam              Number of pieces used: 3 black and 2 white   Contact layer applied: silver non adherent  Continuous negative pressure setting: 125 mm Hg, low, intensity        Has the WOCN team attempted other dressings: no  WOCN assessed clinical reasons for the continuation of VAC at home:  Promotes wound healing, Speed granulation tissue, prevent infection                     Wound Photography:     Objective Findings   Populated from Epic  Documentation:  Specialty Bed: Progressa mattress - Armed forces operational officer, incontinence management systems, and StayInPlace technology to address the five factors of skin breakdown - pressure, shear, friction, heat, and moisture - for optimal wound healing and skin protection.   Braden: Braden Scale  Score: 11 (07/15/22 0800)  Braden Subscales:  Sensory Perceptions: Very limited (07/15/22 0800)  Moisture: Very moist (07/15/22 0800)  Activity: Bedfast (07/15/22 0800)  Mobility: Very limited (07/15/22 0800)  Nutrition: Adequate (07/15/22 0800)  Friction and Shear: Problem (07/15/22 0800)    Ht Readings from Last 1 Encounters:   07/02/22 1.575 m (5\' 2" )     Wt Readings from Last 3 Encounters:   07/15/22 88.3 kg (194 lb 10.7 oz)   06/25/22 87.1 kg (192 lb)   06/23/22 84.8 kg (187 lb)     Body mass index is 35.6 kg/m.    Current Diet:   Supervise For Meals Frequency: All meals  Tube feeding-Continuous  Diet NPO time specified - Surgery/Procedure     History of Present Illness   This is a 82 y.o. female  has a past medical history of Acid reflux, Asthma, well controlled, Cancer of kidney, Chronic lower back pain, Congestive heart disease, COPD (chronic obstructive pulmonary disease), CVA (cerebral infarction) (01/12/2003), Diabetes, Diabetic nephropathy, Fibroids, Gout, H/O: gout, Hyperlipidemia, Hypertension, Neuropathy of hand, and SOB (shortness of breath)..  Admitted with Bladder cancer.      Past Surgical History:   Procedure Laterality Date    APPENDECTOMY (OPEN)      CLOSURE, ENTEROTOMY, SMALL INTESTINE  06/28/2022    Procedure: CLOSURE, ENTEROTOMY, SMALL INTESTINE;  Surgeon: Marlaine Hind, MD;  Location: [facility];  Service: General;;    CYSTECTOMY, ILEOCONDUIT N/A 06/28/2022    Procedure: CYSTECTOMY, RADICAL;  Surgeon: Neldon Newport, MD;  [facility]: [facility];  Service: Urology;  Laterality: N/A;    DRAIN (OTHER) N/A 05/31/2022    Procedure: DRAIN (OTHER);  Surgeon: Hope Pigeon, MD;  [facility]: [facility];  Service: Interventional Radiology;  Laterality: N/A;    EXPLORATORY LAPAROTOMY N/A 06/30/2022    Procedure: SMALL BOWEL ANASTOMOSIS, CLOSURE OF MESENTERIC DEFECT, CLOSURE OF ABDOMINAL WALL;  Surgeon: Marlaine Hind, MD;  Location: [facility];  Service: General;  Laterality: N/A;   **[id number]/ MD AVAIL [id number] OR AFTER 1600**    EXPLORATORY LAPAROTOMY, RESECTION SMALL BOWEL N/A 06/28/2022    Procedure: RESECTION SMALL BOWEL;  Surgeon: Marlaine Hind, MD;  Location: [facility];  Service: General;  Laterality: N/A;    HERNIA REPAIR      HYSTERECTOMY      LAPAROTOMY, NEPHRECTOMY Bilateral 06/28/2022    Procedure: BILATERAL OPEN RADICAL NEPHRECTOMY AND LYSIS OF ASHESION;  Surgeon: Neldon Newport, MD;  [facility]: [facility];  Service: Urology;  Laterality: Bilateral;    PARTIAL NEPHRECTOMY      tumor removed from kidney  cJanuary 2015    TUNNELED CATH PLACEMENT (PERMCATH) N/A 06/01/2022    Procedure: TUNNELED CATH PLACEMENT;  Surgeon: Suszanne Finch, MD;  [facility]: [facility];  Service: Interventional Radiology;  Laterality: N/A;    TUNNELED CATH PLACEMENT (PERMCATH) Right 07/14/2022    Procedure: TUNNELED CATH PLACEMENT;  Surgeon: Hope Pigeon, MD;  [facility];  Service: Interventional Radiology;  Laterality: Right;  right IJ tunneled HD catheter placement    TUNNELED CATH REMOVAL (PERMCATH) Right 07/14/2022    Procedure: TUNNELED CATH REMOVAL;  Surgeon: Hope Pigeon, MD;  [facility];  Service: Interventional Radiology;  Laterality: Right;  right neck/chest tunneled HD catheter removal    TURBT      WOUND VAC APPLICATION N/A 06/28/2022    Procedure: Tawni Pummel WOUND VAC APPLICATION;  Surgeon: Marlaine Hind, MD;  [facility]: [facility];  Service: General;  Laterality: N/A;    WOUND VAC APPLICATION N/A 06/30/2022    Procedure: WOUND VAC APPLICATION;  Surgeon: Marlaine Hind, MD;  [facility]: [facility];  Service: General;  Laterality: N/A;       Procedure(s) with comments:  TUNNELED CATH REMOVAL - right neck/chest tunneled HD catheter removal    17 Days Post-Op  -------------------  **Canceled**    Procedure(s) with comments:  TUNNELED CATH REMOVAL - right neck/chest tunneled HD catheter removal    * No surgery date entered *  -------------------    Procedure(s) with  comments:  TUNNELED CATH REMOVAL - right neck/chest tunneled HD catheter removal    15 Days Post-Op  -------------------    Procedure(s) with comments:  TUNNELED CATH REMOVAL - right neck/chest tunneled HD catheter removal    1 Day Post-Op  -------------------    Procedure(s) with comments:  TUNNELED CATH REMOVAL - right neck/chest tunneled HD catheter removal    1 Day Post-Op  -------------------      Lab   Significant Lab Values:  Recent Labs     07/15/22  0427   WBC 11.90*   RBC 2.68*   Hemoglobin 8.1*   Hematocrit 24.6*   Sodium 135   Potassium 3.7   Chloride 98*   CO2 23   BUN 44*   Creatinine 3.6*   Calcium 8.2   GFR 11.9*   Glucose 145*           Shamirah Ivan "Pepper" Arthuro Canelo  MSN, RN, WOCN  [facility]  Wound, Ostomy, and  Continence Coordinator  [phone] Office  [phone] [employer]  [email or web address]

## 2022-07-15 NOTE — Progress Notes (Signed)
LOS # [id number]      Summary of Discharge Plan: SNF      Identified Possible Discharge Barriers: Patient on amiodarone drip, IV abx. NDT feeding tube in place. On supplemental oxygen. Permacath replaced on 6/26. Patient has wound vac.      CM Interventions and Outcome: CM called and spoke with the patient's daughter [name] [phone]. CM discussed PT recommendation for SNF. Patient is currently on HD. She was previously on PD however switched to HD a couple weeks prior to admission. Home clinic is [facility]. Daughter was considering bringing her home, however there are stairs at the house and she notes concern about getting her into and out of the house to dialysis in current condition. SNF discussed. Locations discussed that have HD onsite in [place] and these were sent to daughter's email. Daughter notes that patient is from [facility], however will be staying in this area after discharge.   SNF referrals sent.      Discussed above Discharge Plan with (patient, family, Care Team, others): care team, daughter [name]      Case Management will continue to follow for [place] needs that may arise.      Marlan Palau, MSW, ACM-SW  Social Worker Case Manager II  [facility]

## 2022-07-15 NOTE — Progress Notes (Signed)
Dialysis treatment summary    Dialysis completed x 3.5 hrs, Net fluid off 2L, tolerated well, pt is at baseline when I started HD, CVC dressing intact post HD, report was given to primary nurse.    Patient Vitals for the past 4 hrs:   BP Temp Pulse Resp   07/15/22 1506 135/63 98 F (36.7 C) 80 (!) 37   07/15/22 1500 107/59 -- 73 (!) 32   07/15/22 1445 124/63 -- 82 (!) 33   07/15/22 1430 113/59 -- 87 (!) 38   07/15/22 1415 126/59 -- 87 (!) 33   07/15/22 1400 122/59 -- 86 (!) 31   07/15/22 1345 132/58 -- 88 (!) 31   07/15/22 1330 138/60 -- 87 (!) 34   07/15/22 1315 133/63 -- 85 (!) 31   07/15/22 1300 139/65 -- 85 (!) 26   07/15/22 1245 (!) 139/95 -- 85 (!) 39   07/15/22 1230 125/60 -- 80 (!) 27   07/15/22 1215 135/56 -- 80 (!) 29   07/15/22 1200 141/65 97.8 F (36.6 C) 80 (!) 33   07/15/22 1145 133/72 -- 64 (!) 34   07/15/22 1137 140/66 97.8 F (36.6 C) 70 (!) 27   07/15/22 1129 140/66 97.8 F (36.6 C) 70 --              07/15/22 1506   Treatment Summary   Time Off Machine 1506   Duration of Treatment (Hours) 3.5   Treatment Type 1:1   Dialyzer Clearance Lightly streaked   Fluid Volume Off (mL) 2400   Prime Volume (mL) 200   Rinseback Volume (mL) 200   Fluid Given: Normal Saline (mL) 0   Fluid Given: PRBC  0 mL   Fluid Given: Albumin (mL) 0   Fluid Given: Other (mL) 0   Total Fluid Given 400   Hemodialysis Net Fluid Removed 2000   Post Treatment Assessment   Post-Treatment Weight (Kg) -2   Patient Response to Treatment tolerated well   Vitals   Temp 98 F (36.7 C)   Heart Rate 80   Resp Rate (!) 37   BP 135/63   SpO2 97 %   Assessment   Cardiac (WDL) WDL   Cardiac Regularity Return to [facility]   Respiratory  WDL   R Breath Sounds Diminished   L Breath Sounds Diminished   Edema  WDL   Pain Assessment   Charting Type Reassessment   Pain Scale Used Numeric Scale (0-10)   Numeric Pain Scale   Pain Score 3   POSS Score 1   Bedside Nurse Communication   Name of bedside RN - post dialysis [place], RN

## 2022-07-16 LAB — COMPREHENSIVE METABOLIC PANEL
ALT: 11 U/L (ref 0–55)
AST (SGOT): 15 U/L (ref 5–41)
Albumin/Globulin Ratio: 0.6 — ABNORMAL LOW (ref 0.9–2.2)
Albumin: 1.8 g/dL — ABNORMAL LOW (ref 3.5–5.0)
Alkaline Phosphatase: 129 U/L — ABNORMAL HIGH (ref 37–117)
Anion Gap: 9 (ref 5.0–15.0)
BUN: 32 mg/dL — ABNORMAL HIGH (ref 7–21)
Bilirubin, Total: 0.2 mg/dL (ref 0.2–1.2)
CO2: 27 mEq/L (ref 17–29)
Calcium: 8.2 mg/dL (ref 7.9–10.2)
Chloride: 98 mEq/L — ABNORMAL LOW (ref 99–111)
Creatinine: 2.5 mg/dL — ABNORMAL HIGH (ref 0.4–1.0)
GFR: 18.4 mL/min/{1.73_m2} — ABNORMAL LOW (ref >=60.0)
Globulin: 2.8 g/dL (ref 2.0–3.6)
Glucose: 166 mg/dL — ABNORMAL HIGH (ref 70–100)
Potassium: 3.4 mEq/L — ABNORMAL LOW (ref 3.5–5.3)
Protein, Total: 4.6 g/dL — ABNORMAL LOW (ref 6.0–8.3)
Sodium: 134 mEq/L — ABNORMAL LOW (ref 135–145)

## 2022-07-16 LAB — LAB USE ONLY - CBC WITH DIFFERENTIAL
Absolute Basophils: 0.05 10*3/uL (ref 0.00–0.08)
Absolute Eosinophils: 0.03 10*3/uL (ref 0.00–0.44)
Absolute Immature Granulocytes: 0.22 10*3/uL — ABNORMAL HIGH (ref 0.00–0.07)
Absolute Lymphocytes: 1.29 10*3/uL (ref 0.42–3.22)
Absolute Monocytes: 1.07 10*3/uL — ABNORMAL HIGH (ref 0.21–0.85)
Absolute Neutrophils: 10.8 10*3/uL — ABNORMAL HIGH (ref 1.10–6.33)
Absolute nRBC: 0 10*3/uL (ref <=0.00)
Basophils %: 0.4 %
Eosinophils %: 0.2 %
Hematocrit: 24.3 % — ABNORMAL LOW (ref 34.7–43.7)
Hemoglobin: 7.9 g/dL — ABNORMAL LOW (ref 11.4–14.8)
Immature Granulocytes %: 1.6 %
Lymphocytes %: 9.6 %
MCH: 29.7 pg (ref 25.1–33.5)
MCHC: 32.5 g/dL (ref 31.5–35.8)
MCV: 91.4 fL (ref 78.0–96.0)
MPV: 11.1 fL (ref 8.9–12.5)
Monocytes %: 7.9 %
Neutrophils %: 80.3 %
Platelet Count: 242 10*3/uL (ref 142–346)
Preliminary Absolute Neutrophil Count: 10.8 10*3/uL — ABNORMAL HIGH (ref 1.10–6.33)
RBC: 2.66 10*6/uL — ABNORMAL LOW (ref 3.90–5.10)
RDW: 16 % — ABNORMAL HIGH (ref 11–15)
WBC: 13.46 10*3/uL — ABNORMAL HIGH (ref 3.10–9.50)
nRBC %: 0 /100 WBC (ref <=0.0)

## 2022-07-16 LAB — WHOLE BLOOD GLUCOSE POCT
Whole Blood Glucose POCT: 148 mg/dL — ABNORMAL HIGH (ref 70–100)
Whole Blood Glucose POCT: 149 mg/dL — ABNORMAL HIGH (ref 70–100)
Whole Blood Glucose POCT: 162 mg/dL — ABNORMAL HIGH (ref 70–100)
Whole Blood Glucose POCT: 176 mg/dL — ABNORMAL HIGH (ref 70–100)
Whole Blood Glucose POCT: 160 mg/dL — ABNORMAL HIGH (ref 70–100)

## 2022-07-16 LAB — PHOSPHORUS: Phosphorus: 2.1 mg/dL — ABNORMAL LOW (ref 2.3–4.7)

## 2022-07-16 LAB — MAGNESIUM: Magnesium: 1.7 mg/dL (ref 1.6–2.6)

## 2022-07-16 MED ORDER — SODIUM CHLORIDE 0.9 % IV BOLUS
100.0000 mL | INTRAVENOUS | Status: AC | PRN
Start: 2022-07-16 — End: 2022-07-17

## 2022-07-16 MED ORDER — SODIUM CHLORIDE 0.9 % IV SOLN
10.0000 mmol | Freq: Once | INTRAVENOUS | Status: AC
  Administered 2022-07-16: 10 mmol via INTRAVENOUS
  Filled 2022-07-16: qty 3.33

## 2022-07-16 MED ORDER — AMIODARONE HCL 200 MG PO TABS
200.0000 mg | ORAL_TABLET | Freq: Every day | ORAL | Status: DC
Start: 2022-07-16 — End: 2022-08-18
  Administered 2022-07-16 – 2022-08-17 (×32): 200 mg via NASOGASTRIC
  Filled 2022-07-16 (×36): qty 1

## 2022-07-16 MED ORDER — SODIUM CHLORIDE 0.9 % IV BOLUS
250.0000 mL | INTRAVENOUS | Status: AC | PRN
Start: 2022-07-16 — End: 2022-07-17

## 2022-07-16 NOTE — Plan of Care (Signed)
[facility] Consultation Note                                       Date Time: 07/16/22 7:40 AM  Patient Name: Shelby Barron, Shelby Barron  Requesting Physician: Gayleen Orem, MD  Date of Admission: 06/28/2022    CC / Reason for Consultation: LUE weakness           Assessment:     82y/o woman hx of COPD, CVA, DM, renal cell carcinoma s/p bilateral nephrectomy, [name], new diagnosis of urothelial cancer admitted for elective cystectomy and completion nephrectomy. Neurology consulted for LUE weakness and change in speech.     On exam patient moaning, no coherent speech, flaccid LUE.     MRI brain with patchy areas of acute infarct involving bilateral cerebral hemispheres. Pattern suggestive of watershed infarcts. 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    Infarcts likely in setting of drop in blood pressure based on distribution of infarcts on imaging.     -TTE 06/07 EF 65%  -LDL 23, trig 185  -HbA1c 5.6    Plan:     -ASA 81mg  daily cleared by GI. Long term would potentially benefit from anticoagulation in setting of new onset A fib but based on size of infarcts would wait at least 10 days prior to starting anticoagulation  -suggest vascular surgery review of carotid doppler findings  -daily statin  -SBP goal 120-160   -PT/OT evaluation  -Neurology will follow along as needed. Please call with further questions        Elspeth Cho  [facility] Neurology  Available on Epic chat 7a to 7p

## 2022-07-16 NOTE — Plan of Care (Signed)
NURSING SHIFT NOTE     Patient: Shelby Barron  Day: 09      SHIFT EVENTS     Shift Narrative/Significant Events (PRN med administration, fall, RRT, etc.):       Pt is oriented to herself and situation but very forgetful. She is on room air, vss and afebrile. On continuous tube feedings. Family at bedside. Wound vac is suctioning. Meds crushed and given through ng tube. Pt is more alert and nih remains 23. Family is present at bedside.    Safety and fall precautions remain in place. Purposeful rounding completed.          ASSESSMENT     Changes in assessment from patient's baseline this shift:    Neuro: No  CV: No  Pulm: No  Peripheral Vascular: No  HEENT: No  GI: No  BM during shift: No  Last BM: Last BM Date: 07/14/22  GU: No   Integ: No  MS: No    Pain: Generailized pain  Pain Interventions: pain meds  Medications Utilized: tylenol    Mobility: PMP Activity: Step 2 - Supine Exercises of Distance Walked (ft) (Step 6,7): 0 Feet           Lines     Patient Lines/Drains/Airways Status       Active Lines, Drains and Airways       Name Placement date Placement time Site Days    Permacath Catheter - Tunneled 07/14/22 Subclavian vein catheter Right 07/14/22  1404  -- 2    Peripheral IV 07/15/22 20 G Standard Posterior;Right Hand 07/15/22  0419  Hand  1    Midline IV 07/01/22 Anterior;Left Upper Arm 07/01/22  1315  Upper Arm  15    Feeding Tube ND 12 Fr. Right nare 07/05/22  1130  Right nare  11                         VITAL SIGNS     Vitals:    07/16/22 2308   BP:    Pulse: 100   Resp:    Temp:    SpO2:        Temp  Min: 97.9 F (36.6 C)  Max: 99.4 F (37.4 C)  Pulse  Min: 68  Max: 100  Resp  Min: 18  Max: 49  BP  Min: 116/51  Max: 155/63  SpO2  Min: 95 %  Max: 100 %      Intake/Output Summary (Last 24 hours) at 07/16/2022 2332  Last data filed at 07/16/2022 0400  Gross per 24 hour   Intake 130 ml   Output 0 ml   Net 130 ml            CARE PLAN           Problem: Moderate/High Fall Risk Score >5  Goal: Patient will  remain free of falls  Outcome: Progressing  Flowsheets (Taken 07/16/2022 2329)  High (Greater than 13):   HIGH-Consider use of low bed   HIGH-Pharmacy to initiate evaluation and intervention per protocol   HIGH-Utilize chair pad alarm for patient while in the chair   HIGH-Apply yellow "Fall Risk" arm band   HIGH-Bed alarm on at all times while patient in bed     Problem: Compromised Sensory Perception  Goal: Sensory Perception Interventions  Outcome: Progressing  Flowsheets (Taken 07/16/2022 2329)  Sensory Perception Interventions: Offload heels, Pad bony prominences, Reposition q 2hrs/turn Clock, Q2 hour skin  assessment under devices if present     Problem: Compromised Moisture  Goal: Moisture level Interventions  Outcome: Progressing  Flowsheets (Taken 07/16/2022 2329)  Moisture level Interventions: Moisture wicking products, Moisture barrier cream     Problem: Compromised Activity/Mobility  Goal: Activity/Mobility Interventions  Outcome: Progressing  Flowsheets (Taken 07/16/2022 2329)  Activity/Mobility Interventions: Pad bony prominences, TAP Seated positioning system when OOB, Promote PMP, Reposition q 2 hrs / turn clock, Offload heels     Problem: Compromised Nutrition  Goal: Nutrition Interventions  Outcome: Progressing  Flowsheets (Taken 07/16/2022 2329)  Nutrition Interventions: Discuss nutrition at Rounds, I&Os, Document % meal eaten, Daily weights     Problem: Compromised Friction/Shear  Goal: Friction and Shear Interventions  Outcome: Progressing  Flowsheets (Taken 07/16/2022 2329)  Friction and Shear Interventions: Pad bony prominences, Off load heels, HOB 30 degrees or less unless contraindicated, Consider: TAP seated positioning, Heel foams     Problem: Inadequate Airway Clearance  Goal: Patent Airway maintained  Outcome: Progressing  Flowsheets (Taken 07/16/2022 2329)  Patent airway maintained: Suction secretions as needed     Problem: Infection Prevention  Goal: Free from infection  Outcome:  Progressing  Flowsheets (Taken 07/16/2022 2329)  Free from infection:   Monitor/assess vital signs   Encourage/assist patient to turn, cough and perform deep breathing every 2 hours   Assess incision for evidence of healing   Assess for signs and symptoms of infection     Problem: Impaired Mobility  Goal: Mobility/Activity is maintained at optimal level for patient  Outcome: Progressing  Flowsheets (Taken 07/16/2022 2329)  Mobility/activity is maintained at optimal level for patient:   Encourage independent activity per ability   Consult/collaborate with Physical Therapy and/or Occupational Therapy     Problem: Constipation  Goal: Fluid and electrolyte balance are achieved/maintained  Outcome: Progressing  Flowsheets (Taken 07/16/2022 2329)  Fluid and electrolyte balance are achieved/maintained:   Monitor/assess lab values and report abnormal values   Assess and reassess fluid and electrolyte status  Goal: Elimination patterns are normal or improving  Outcome: Progressing     Problem: Safety  Goal: Patient will be free from injury during hospitalization  Outcome: Progressing  Flowsheets (Taken 07/16/2022 2329)  Patient will be free from injury during hospitalization:   Assess patient's risk for falls and implement fall prevention plan of care per policy   Provide and maintain safe environment   Use appropriate transfer methods   Hourly rounding  Goal: Patient will be free from infection during hospitalization  Outcome: Progressing  Flowsheets (Taken 07/16/2022 2329)  Free from Infection during hospitalization:   Assess and monitor for signs and symptoms of infection   Monitor lab/diagnostic results     Problem: Pain  Goal: Pain at adequate level as identified by patient  Outcome: Progressing  Flowsheets (Taken 07/16/2022 2329)  Pain at adequate level as identified by patient:   Assess for risk of opioid induced respiratory depression, including snoring/sleep apnea. Alert healthcare team of risk factors identified.    Identify patient comfort function goal   Assess pain on admission, during daily assessment and/or before any "as needed" intervention(s)     Problem: Side Effects from Pain Analgesia  Goal: Patient will experience minimal side effects of analgesic therapy  Outcome: Progressing  Flowsheets (Taken 07/16/2022 2329)  Patient will experience minimal side effects of analgesic therapy:   Monitor/assess patient's respiratory status (RR depth, effort, breath sounds)   Assess for changes in cognitive function     Problem: Hemodynamic Status:  Cardiac  Goal: Stable vital signs and fluid balance  Outcome: Progressing  Flowsheets (Taken 07/16/2022 2329)  Stable vital signs and fluid balance: Assess signs and symptoms associated with cardiac rhythm changes     Problem: Renal Instability  Goal: Fluid and electrolyte balance are achieved/maintained  Outcome: Progressing  Flowsheets (Taken 07/16/2022 2329)  Fluid and electrolyte balance are achieved/maintained:   Monitor/assess lab values and report abnormal values   Assess and reassess fluid and electrolyte status     Problem: Patient Receiving Advanced Renal Therapies  Goal: Therapy access site remains intact  Outcome: Progressing     Problem: Fluid and Electrolyte Imbalance/ Endocrine  Goal: Fluid and electrolyte balance are achieved/maintained  Outcome: Progressing  Flowsheets (Taken 07/16/2022 2329)  Fluid and electrolyte balance are achieved/maintained:   Monitor/assess lab values and report abnormal values   Assess and reassess fluid and electrolyte status  Goal: Adequate hydration  Outcome: Progressing  Flowsheets (Taken 07/16/2022 2329)  Adequate hydration: Assess mucus membranes, skin color, turgor, perfusion and presence of edema     Problem: Inadequate Gas Exchange  Goal: Patent Airway maintained  Outcome: Progressing  Flowsheets (Taken 07/16/2022 2329)  Patent airway maintained: Suction secretions as needed     Problem: Neurological Deficit  Goal: Neurological status is stable  or improving  Outcome: Progressing  Flowsheets (Taken 07/16/2022 2329)  Neurological status is stable or improving:   Monitor/assess/document neurological assessment (Stroke: every 4 hours)   Monitor/assess NIH Stroke Scale   Re-assess NIH Stroke Scale for any change in status   Observe for seizure activity and initiate seizure precautions if indicated     Problem: Potential for Aspiration  Goal: Risk of aspiration will be minimized  Outcome: Progressing  Flowsheets (Taken 07/16/2022 2329)  Risk of aspiration will be minimized: Assess/monitor ability to swallow using dysphagia screen: Keep patient NPO if patient fails screening     Problem: Every Day - Stroke  Goal: Mobility/Activity is maintained at optimal level for patient  Outcome: Progressing  Flowsheets (Taken 07/16/2022 2329)  Mobility/activity is maintained at optimal level for patient:   Encourage independent activity per ability   Consult/collaborate with Physical Therapy and/or Occupational Therapy  Goal: Elimination patterns are normal or improving  Outcome: Progressing  Goal: Neurological status is stable or improving  Outcome: Progressing  Flowsheets (Taken 07/16/2022 2329)  Neurological status is stable or improving:   Monitor/assess/document neurological assessment (Stroke: every 4 hours)   Monitor/assess NIH Stroke Scale   Re-assess NIH Stroke Scale for any change in status   Observe for seizure activity and initiate seizure precautions if indicated  Goal: Core/Quality measure requirements - Daily  Outcome: Progressing  Flowsheets (Taken 07/16/2022 2329)  Core/Quality measure requirements - Daily: VTE Prevention: Ensure anticoagulant(s) administered and/or anti-embolism stockings/devices documented by end of day 2  Goal: Stable vital signs and fluid balance  Outcome: Progressing  Flowsheets (Taken 07/16/2022 2329)  Stable vital signs and fluid balance:   Position patient for maximum circulation/cardiac output   Monitor and assess vitals every 4 hours or  as ordered and hemodynamic parameters  Goal: Patient will maintain adequate oxygenation  Outcome: Progressing  Flowsheets (Taken 07/16/2022 2329)  Patient will maintain adequate oxygenation:   Suction secretions as needed   Maintain SpO2 of greater than 92%  Goal: Patient's risk of aspiration will be minimized  Outcome: Progressing  Flowsheets (Taken 07/16/2022 2329)  Patient's risk of aspiration will be minimized: Complete new dysphagia screen for any change in status: Keep patient NPO if  patient fails  Goal: Nutritional intake is adequate  Outcome: Progressing  Flowsheets (Taken 07/16/2022 2329)  Nutritional intake is adequate: Consult/collaborate with Clinical Nutritionist  Goal: Skin integrity is maintained or improved  Outcome: Progressing  Flowsheets (Taken 07/16/2022 2329)  Skin integrity is maintained or improved: Assess Braden Scale every shift     Problem: Pain interferes with ability to perform ADL  Goal: Pain at adequate level as identified by patient  Outcome: Progressing  Flowsheets (Taken 07/16/2022 2329)  Pain at adequate level as identified by patient:   Assess for risk of opioid induced respiratory depression, including snoring/sleep apnea. Alert healthcare team of risk factors identified.   Identify patient comfort function goal   Assess pain on admission, during daily assessment and/or before any "as needed" intervention(s)

## 2022-07-16 NOTE — Progress Notes (Addendum)
LOS # [id number]    Summary of Discharge Plan:   SNF - pending choice     Identified Possible Discharge Barriers:   Auth needed prior to placement      CM Interventions and Outcome:    - Voicemail left for [name] (daughter) via phone [phone])    Spoke with [name] (daughter) via phone regarding DCP. She has reviewed the current SNF list choices and intends to visit the facilities. [name] requested alternative SNF options to make a choice ([email or web address]).    - Referral updated via [employer] with request for transportation      Discussed above Discharge Plan with (patient, family, Care Team, others):  Patient, Family, Care team    Case Management will continue to following on patient's discharge needs.    [place], MSW  Social Work Fut[employer] trader I  [facility]  [phone]

## 2022-07-16 NOTE — OT Eval Note (Addendum)
Occupational Therapy Eval Shelby Barron        Post Acute Care Therapy Recommendations   Discharge Recommendations:  SNF    DME needs IF patient is discharging home: No additional equipment/DME recommended at this time (TBD further OOB)    Therapy discharge recommendations may change with patient status.  Please refer to most recent note for up-to-date recommendations.    Unit: [facility]  Bed: Z6109/U0454-U      ___________________________________________________    Time of Evaluation:  Time Calculation  OT Received On: 07/16/22  Start Time: 0909  Stop Time: 0927  Time Calculation (min): 18 min       Chart Review and Collaboration with Care Team: 10 minutes, not included in above time.    OT Visit Number: 1    Consult received for Shelby Barron for OT Evaluation and Treatment.  Patient's medical condition is appropriate for Occupational therapy intervention at this time.      Activity Orders:  OT eval and treat    Precautions and Contraindications:  Precautions  Weight Bearing Status: no restrictions  Aspiration Precautions: see SLP recommendations  Fall Risks: High, Impaired balance/gait, Impaired mobility, Muscle weakness, Mental status change  Seizure Precautions: Yes (comment date of last seizure)  Communication Precautions: Expressive aphasia, Receptive aphasia, Unable to verbalize basic wants/needs/pain  Other Precautions: skin & pressure ulcer; SBP 120-160; L side flaccid    Personal Protective Equipment (PPE)  gloves and procedure mask    Medical Diagnosis:  Malignant neoplasm of urinary bladder, 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 female admitted on 06/28/2022 with new diagnosis of urothelial cancer initally presented on 6/10 for elective cystectomy and completion nephrectomy, [name]. On 6/25 Neurology consulted  for noted LUE weakness. Family notes that immediately after her surgery she appeared to be moving all extremities and talking normally. MRI brain with patchy areas of acute infarct involving bilateral cerebral hemispheres. Pattern suggestive of watershed infarcts.     Patient Active Problem List   Diagnosis    Type 2 diabetes mellitus with kidney complication, with long-term current use of insulin    Diabetes mellitus type II, uncontrolled    Neuropathy of hand    Left renal mass    Hamstring injury    Peritonitis    ESRD (end stage renal disease)    Chronic diastolic congestive heart failure    Aortic atherosclerosis    Malignant neoplasm of urinary bladder, unspecified site    Malignant tumor of kidney, left    Benign essential hypertension    GERD (gastroesophageal reflux disease)    Leukocytosis    Bladder cancer    Anemia    COPD (chronic obstructive pulmonary disease)       Past Medical/Surgical History:  Past Medical History:   Diagnosis Date    Acid reflux     Asthma, well controlled     Cancer of kidney     Chronic lower back pain     Congestive heart disease     COPD (chronic obstructive pulmonary disease)     CVA (cerebral infarction) 01/12/2003    Diabetes     Diabetic nephropathy     Fibroids     Gout     H/O: gout     Hyperlipidemia     Hypertension  Neuropathy of hand     SOB (shortness of breath)       Past Surgical History:   Procedure Laterality Date    APPENDECTOMY (OPEN)      CLOSURE, ENTEROTOMY, SMALL INTESTINE  06/28/2022    Procedure: CLOSURE, ENTEROTOMY, SMALL INTESTINE;  Surgeon: Marlaine Hind, MD;  [facility];  Service: General;;    CYSTECTOMY, ILEOCONDUIT N/A 06/28/2022    Procedure: CYSTECTOMY, RADICAL;  Surgeon: Neldon Newport, MD;  [facility]: [facility];  Service: Urology;  Laterality: N/A;    DRAIN (OTHER) N/A 05/31/2022    Procedure: DRAIN (OTHER);  Surgeon: Hope Pigeon, MD;  Location: [facility];  Service: Interventional Radiology;  Laterality: N/A;     EXPLORATORY LAPAROTOMY N/A 06/30/2022    Procedure: SMALL BOWEL ANASTOMOSIS, CLOSURE OF MESENTERIC DEFECT, CLOSURE OF ABDOMINAL WALL;  Surgeon: Marlaine Hind, MD;  [facility]: [facility];  Service: General;  Laterality: N/A;  **[id number]/ MD AVAIL [id number] OR AFTER 1600**    EXPLORATORY LAPAROTOMY, RESECTION SMALL BOWEL N/A 06/28/2022    Procedure: RESECTION SMALL BOWEL;  Surgeon: Marlaine Hind, MD;  [facility]: [facility];  Service: General;  Laterality: N/A;    HERNIA REPAIR      HYSTERECTOMY      LAPAROTOMY, NEPHRECTOMY Bilateral 06/28/2022    Procedure: BILATERAL OPEN RADICAL NEPHRECTOMY AND LYSIS OF ASHESION;  Surgeon: Neldon Newport, MD;  [facility]: [facility];  Service: Urology;  Laterality: Bilateral;    PARTIAL NEPHRECTOMY      tumor removed from kidney  cJanuary 2015    TUNNELED CATH PLACEMENT (PERMCATH) N/A 06/01/2022    Procedure: TUNNELED CATH PLACEMENT;  Surgeon: Suszanne Finch, MD;  [facility]: [facility];  Service: Interventional Radiology;  Laterality: N/A;    TUNNELED CATH PLACEMENT (PERMCATH) Right 07/14/2022    Procedure: TUNNELED CATH PLACEMENT;  Surgeon: Hope Pigeon, MD;  [facility];  Service: Interventional Radiology;  Laterality: Right;  right IJ tunneled HD catheter placement    TUNNELED CATH REMOVAL (PERMCATH) Right 07/14/2022    Procedure: TUNNELED CATH REMOVAL;  Surgeon: Hope Pigeon, MD;  [facility];  Service: Interventional Radiology;  Laterality: Right;  right neck/chest tunneled HD catheter removal    TURBT      WOUND VAC APPLICATION N/A 06/28/2022    Procedure: Tawni Pummel WOUND VAC APPLICATION;  Surgeon: Marlaine Hind, MD;  [facility]: [facility];  Service: General;  Laterality: N/A;    WOUND VAC APPLICATION N/A 06/30/2022    Procedure: WOUND VAC APPLICATION;  Surgeon: Marlaine Hind, MD;  [facility]: [facility];  Service: General;  Laterality: N/A;         X-Rays/Tests/Labs:  Lab Results   Component Value Date/Time    HGB 7.9 (L) 07/16/2022 02:44 AM     HGB 8.6 (L) 06/03/2022 04:23 AM    HCT 24.3 (L) 07/16/2022 02:44 AM    HCT 26.8 (L) 06/03/2022 04:23 AM    K 3.4 (L) 07/16/2022 02:44 AM    K 3.6 06/03/2022 04:23 AM    NA 134 (L) 07/16/2022 02:44 AM    NA 137 06/03/2022 04:23 AM    INR 1.1 07/14/2022 04:35 AM    INR 1.1 06/01/2022 03:15 AM    TROPI 9.6 05/26/2022 06:37 PM    TROPI -2.2 05/26/2022 06:37 PM    TROPI 11.8 05/26/2022 03:26 PM       All imaging reviewed, please see chart for details.    Social History:  Prior Level  of Function  Prior level of function: Ambulates with assistive device, Needs assistance with ADLs  Assistive Device: Four wheel walker  Baseline Activity Level: Household ambulation  Driving: does not drive  Bathing:  (has assist)  DME Currently at Home: Dan Humphreys, Four Wheel, ADL- Grab Bars, [place], [employer]    Home Living Arrangements  Living Arrangements: Children  Type of Home: House  Home Layout: Able to live on main level with bedroom/bathroom, Stairs to enter with rails (add number in comment) (9 STE w/ rail + 2 STE to platform w/ rail, 6 stairs to second level bedroom/bathroom)  Bathroom Shower/Tub: Pension scheme manager: Midwife: Grab bars in shower  DME Currently at Home: Environmental consultant, [employer], ADL- [employer], [place], [employer]  Home Living - Notes / Comments: history/PLOF obtained from daughter 2/2 pt cognitive changes. Pts daughter reports that pt resides in [place] w/ husband but traveled to [place] for cancer procedure. Prior to admission, pt was ambulatory w/ rollator and able to perform stairs. Since cancer pain got worse, pt has been experiencing difficulty with her mobility. Pt has a caregiver and will have a caregiver at home as well.    Subjective:  Subjective: Pt nonverbal at this time.   Patient is unable to indicate agreement for the therapy session but is able to participate in the selected activities. Nursing clears patient for therapy.  Patient Goal: none stated  Pain Assessment  Pain Assessment:  Wong-Baker FACES  Wong-Baker FACES Pain Rating: Hurts little bit  Pain Location: Shoulder (drg reporting "just got a shot there")  Pain Orientation: Left  Pain Intervention(s): Repositioned, Ambulation/increased activity, Elevated, Rest, Therapeutic presence      Objective:   Inspection/Posture  Inspection/Posture: supine /drg at bedside    Observation of Patient/Vital Signs:vss    Cognitive Status and Neuro Exam:  Cognition/Neuro Status  Arousal/Alertness: Delayed responses to stimuli  Attention Span: Difficulty attending to directions  Orientation Level: Disoriented X4 (pt nonverbal at this time; was responsive to name)  Memory: Decreased short term memory, Decreased long term memory  Following Commands: maximal verbal instruction (intermittently following commands)  Safety Awareness: maximal verbal instruction  Insights: Not aware of deficits  Problem Solving: Unable to assess  Behavior: flat affect, calm, cooperative  Motor Planning: decreased processing speed, decreased initiation  Coordination: FMC impaired, GMC impaired  Hand Dominance: right handed    Musculoskeletal Examination  Gross ROM  Right Upper Extremity ROM: needs focused assessment  Right Upper Extremity Overall ROM % reduced: reduced by 50% (AROM)  Left Upper Extremity ROM:  (flaccid; WFL PROM)  Gross Strength  Right Upper Extremity Strength: unable to assess (2/2 cognition)  Left Upper Extremity Strength: 0/5    Sensory/Oculomotor Examination  Vision - Complex Assessment  Tracking: Requires cues, head turns, or add eye shifts to track (can turn head R/l & make eye contact)    Activities of Daily Living  Self-care and Home Management  Grooming: Maximal Assist  LB Dressing: Dependent  Toileting: Dependent    Functional Mobility:  Mobility and Transfers  Rolling: Dependent, to Left, to Right  Supine to Sit: Unable to assess (Comment) (2/2 dec safety, dec alertness; benefit 2 person)     Participation and Activity Tolerance  Participation and  Endurance  Participation Effort: fair  Endurance: Tolerates 10 - 20 min exercise with multiple rests    Educated the Family to role of occupational therapy, plan of care, goals of therapy and safety with mobility  and ADLs with verbalized understanding .    Patient left in bed with alarm and all other medical equipment in place and call bell and all personal items/needs within reach.  RN notified of session outcome. CM team and attending have been previously notified of d/c recommendation; no updates to d/c recommendation since that time.      Assessment:  CLARISSE DEGENNARO is a 82 y.o. female admitted 06/28/2022. OT Assessment: decreased ROM;decreased strength;balance deficits;decreased independence with ADLs;decreased safety awareness;decreased attention;decreased cognition;decreased endurance/activity tolerance;decreased independence with IADLs    Rehabilitation Potential:  Prognosis: Fair, With continued OT s/p acute discharge, With family, Ongoing OT assessment needed    Plan:  OT Frequency Recommended: 2-3x/wk   Treatment Interventions: ADL retraining, Functional transfer training, UE strengthening/ROM, Patient/Family training, Equipment eval/education, Neuro muscular reeducation, Compensatory technique education, Continued evaluation     PMP Activity: Step 2 - Supine Exercises  Distance Walked (ft) (Step 6,7): 0 Feet      Risks/benefits/POC discussed    Goals:  Time For Goal Achievement: by time of discharge  ADL Goals  Patient will groom self: Moderate Assist (RUE; face washing)     Neuro Re-Ed Goals  Pt will sit at edge of bed: Maximal Assist, for 3 minutes  Musculoskeletal Goals  Pt will perform Home Exercise Program: maximal assist, with caregiver/family assist  Executive Fucntion Goals  Pt will demonstrate attention to task: with minimal assist, to increase ability to complete ADLs                  Shirleen Schirmer, OTR/L  [phone]  Physical Medicine and Rehabilitation  [facility]    07/16/2022  9:56 AM      [facility]  Patient: Shelby Barron MRN#: [id number]   Unit: [facility] Bed: [phone]

## 2022-07-16 NOTE — Plan of Care (Signed)
NURSING SHIFT NOTE     Patient: Shelby Barron  Day: 19      SHIFT EVENTS     Shift Narrative/Significant Events (PRN med administration, fall, RRT, etc.):     Pt transferred to the unit around 8 am this morning. Vital signs taken, 4 eyes skin assessment completed, Tele placed. Pt seemed to be in pain, PRN pain meds given as ordered. NIH 23. NG tube in place running Nepro @40mL /hr. Wound VAC present in the abd. Cont' IV abx and pain management. Amiodarone drip Snover, started on po. Monitor for bleeding. Family visiting and updated. Safety and fall precautions remain in place. Purposeful rounding completed.          ASSESSMENT     Changes in assessment from patient's baseline this shift:    Neuro: No  CV: No  Pulm: No  Peripheral Vascular: No  HEENT: No  GI: No  BM during shift: No, Last BM: Last BM Date: 07/14/22  GU: No   Integ: No  MS: No    Pain: None  Pain Interventions: Rest  Medications Utilized: none    Mobility: PMP Activity: Step 2 - Supine Exercises of Distance Walked (ft) (Step 6,7): 0 Feet           Lines     Patient Lines/Drains/Airways Status       Active Lines, Drains and Airways       Name Placement date Placement time Site Days    Permacath Catheter - Tunneled 07/14/22 Subclavian vein catheter Right 07/14/22  1404  -- 2    Peripheral IV 07/15/22 20 G Standard Posterior;Right Hand 07/15/22  0419  Hand  1    Midline IV 07/01/22 Anterior;Left Upper Arm 07/01/22  1315  Upper Arm  15    Feeding Tube ND 12 Fr. Right nare 07/05/22  1130  Right nare  11                         VITAL SIGNS     Vitals:    07/16/22 1550   BP: 116/51   Pulse: 79   Resp: 20   Temp: 98.2 F (36.8 C)   SpO2: 100%       Temp  Min: 98.2 F (36.8 C)  Max: 99.4 F (37.4 C)  Pulse  Min: 65  Max: 87  Resp  Min: 20  Max: 49  BP  Min: 105/49  Max: 154/65  SpO2  Min: 95 %  Max: 100 %      Intake/Output Summary (Last 24 hours) at 07/16/2022 1622  Last data filed at 07/16/2022 0400  Gross per 24 hour   Intake 820.5 ml   Output 0 ml   Net  820.5 ml          4 eyes in 4 hours pressure injury assessment note:      Completed with: Pam Drown  Unit & Time admitted:  unit 25, 8am 07/16/2022             Bony Prominences: Check appropriate box; if wound is present enter wound assessment in LDA     Occiput:                 [x] WNL  []  Wound present  Face:                     [x] WNL  []  Wound present  Ears:                      [  x]WNL  []  Wound present  Spine:                    [x] WNL  []  Wound present  Shoulders:             [x] WNL  []  Wound present  Elbows:                  [x] WNL  []  Wound present  Sacrum/coccyx:     [x] WNL  []  Wound present  Ischial Tuberosity:  [x] WNL  []  Wound present  Trochanter/Hip:      [x] WNL  []  Wound present  Knees:                   [x] WNL  []  Wound present  Ankles:                   [x] WNL  []  Wound present  Heels:                    [x] WNL  []  Wound present  Other pressure areas:  []  Wound location       Device related: [x]  Device name:  wound VAC present in the abd        LDA completed if wound present: yes/no  Consult WOCN if necessary    Other skin related issues, ie tears, rash, etc, document in Integumentary flowsheet    CARE PLAN     Problem: Moderate/High Fall Risk Score >5  Goal: Patient will remain free of falls  Outcome: Progressing  Flowsheets  Taken 07/13/2022 0730 by Theodoro Grist, [place], RN  Moderate Risk (6-13):   MOD-Consider activation of bed alarm if appropriate   MOD-Apply bed exit alarm if patient is confused   MOD-Floor mat at bedside (where available) if appropriate   MOD-Consider a move closer to Nurses Station   MOD-Remain with patient during toileting   MOD-Place bedside commode and assistive devices out of sight when not in use   MOD-Re-orient confused patients  Taken 07/05/2022 0325 by Raynelle Chary, RN  VH High Risk (Greater than 13):   ALL REQUIRED LOW INTERVENTIONS   ALL REQUIRED MODERATE INTERVENTIONS     Problem: Compromised Sensory Perception  Goal: Sensory Perception Interventions  Outcome:  Progressing  Flowsheets (Taken 07/16/2022 0800)  Sensory Perception Interventions: Offload heels, Pad bony prominences, Reposition q 2hrs/turn Clock, Q2 hour skin assessment under devices if present     Problem: Compromised Moisture  Goal: Moisture level Interventions  Outcome: Progressing  Flowsheets (Taken 07/16/2022 0800)  Moisture level Interventions: Moisture wicking products, Moisture barrier cream     Problem: Compromised Activity/Mobility  Goal: Activity/Mobility Interventions  Outcome: Progressing  Flowsheets (Taken 07/16/2022 0800)  Activity/Mobility Interventions: Pad bony prominences, TAP Seated positioning system when OOB, Promote PMP, Reposition q 2 hrs / turn clock, Offload heels     Problem: Compromised Nutrition  Goal: Nutrition Interventions  Outcome: Progressing  Flowsheets (Taken 07/16/2022 0800)  Nutrition Interventions: Discuss nutrition at Rounds, I&Os, Document % meal eaten, Daily weights     Problem: Compromised Friction/Shear  Goal: Friction and Shear Interventions  Outcome: Progressing  Flowsheets (Taken 07/16/2022 0800)  Friction and Shear Interventions: Pad bony prominences, Off load heels, HOB 30 degrees or less unless contraindicated, Consider: TAP seated positioning, Heel foams     Problem: Inadequate Airway Clearance  Goal: Patent Airway maintained  Outcome: Progressing  Flowsheets (Taken 07/14/2022 1859 by Maxwell Marion, RN)  Patent airway maintained:   Position patient  for maximum ventilatory efficiency   Reinforce use of ordered respiratory interventions (i.e. CPAP, BiPAP, Incentive Spirometer, Acapella, etc.)   Reposition patient every 2 hours and as needed unless able to self-reposition  Goal: Normal respiratory rate/effort achieved/maintained  Outcome: Progressing  Flowsheets (Taken 07/13/2022 0137 by Kerin Salen, RN)  Normal respiratory rate/effort achieved/maintained: Plan activities to conserve energy: plan rest periods     Problem: Impaired Mobility  Goal:  Mobility/Activity is maintained at optimal level for patient  Outcome: Progressing  Flowsheets (Taken 07/15/2022 1159 by Loel Ro, RN)  Mobility/activity is maintained at optimal level for patient:   Encourage independent activity per ability   Consult/collaborate with Physical Therapy and/or Occupational Therapy     Problem: Constipation  Goal: Fluid and electrolyte balance are achieved/maintained  Outcome: Progressing  Flowsheets (Taken 07/15/2022 1159 by Loel Ro, RN)  Fluid and electrolyte balance are achieved/maintained:   Monitor/assess lab values and report abnormal values   Assess and reassess fluid and electrolyte status   Observe for cardiac arrhythmias   Monitor for muscle weakness  Goal: Elimination patterns are normal or improving  Outcome: Progressing     Problem: Safety  Goal: Patient will be free from injury during hospitalization  Outcome: Progressing  Flowsheets (Taken 07/15/2022 1159 by Loel Ro, RN)  Patient will be free from injury during hospitalization:   Assess patient's risk for falls and implement fall prevention plan of care per policy   Provide and maintain safe environment   Use appropriate transfer methods   Hourly rounding   Include patient/ family/ care giver in decisions related to safety   Ensure appropriate safety devices are available at the bedside   Assess for patients risk for elopement and implement Elopement Risk Plan per policy  Goal: Patient will be free from infection during hospitalization  Outcome: Progressing  Flowsheets (Taken 07/15/2022 1159 by Loel Ro, RN)  Free from Infection during hospitalization:   Assess and monitor for signs and symptoms of infection   Encourage patient and family to use good hand hygiene technique   Monitor lab/diagnostic results   Monitor all insertion sites (i.e. indwelling lines, tubes, urinary catheters, and drains)     Problem: Pain  Goal: Pain at adequate level as identified by patient  Outcome: Progressing  Flowsheets (Taken  07/15/2022 1159 by Loel Ro, RN)  Pain at adequate level as identified by patient:   Assess for risk of opioid induced respiratory depression, including snoring/sleep apnea. Alert healthcare team of risk factors identified.   Identify patient comfort function goal   Assess pain on admission, during daily assessment and/or before any "as needed" intervention(s)   Reassess pain within 30-60 minutes of any procedure/intervention, per Pain Assessment, Intervention, Reassessment (AIR) Cycle   Evaluate patient's satisfaction with pain management progress   Evaluate if patient comfort function goal is met   Offer non-pharmacological pain management interventions   Consult/collaborate with Physical Therapy, Occupational Therapy, and/or Speech Therapy   Include patient/patient care companion in decisions related to pain management as needed     Problem: Side Effects from Pain Analgesia  Goal: Patient will experience minimal side effects of analgesic therapy  Outcome: Progressing  Flowsheets (Taken 07/14/2022 2000 by Hannesschlager, Angelique Blonder, RN)  Patient will experience minimal side effects of analgesic therapy:   Monitor/assess patient's respiratory status (RR depth, effort, breath sounds)   Assess for changes in cognitive function   Prevent/manage side effects per LIP orders (i.e. nausea, vomiting, pruritus, constipation, urinary retention, etc.)  Evaluate for opioid-induced sedation with appropriate assessment tool (i.e. POSS)     Problem: Renal Instability  Goal: Fluid and electrolyte balance are achieved/maintained  Outcome: Progressing  Flowsheets (Taken 07/16/2022 0346 by Lia Hopping, RN)  Fluid and electrolyte balance are achieved/maintained:   Monitor/assess lab values and report abnormal values   Assess and reassess fluid and electrolyte status   Observe for cardiac arrhythmias   Monitor for muscle weakness   Follow fluid restrictions/IV/PO parameters     Problem: Hemodynamic Status: Cardiac  Goal: Stable  vital signs and fluid balance  Outcome: Progressing  Flowsheets (Taken 07/14/2022 1859 by Maxwell Marion, RN)  Stable vital signs and fluid balance:   Assess signs and symptoms associated with cardiac rhythm changes   Monitor lab values     Problem: Patient Receiving Advanced Renal Therapies  Goal: Therapy access site remains intact  Outcome: Progressing  Flowsheets (Taken 07/15/2022 1154 by Winfield Rast, RN)  Therapy access site remains intact:   Assess therapy access site   Change therapy access site dressing as needed     Problem: Fluid and Electrolyte Imbalance/ Endocrine  Goal: Fluid and electrolyte balance are achieved/maintained  Outcome: Progressing  Flowsheets (Taken 07/15/2022 1159 by Loel Ro, RN)  Fluid and electrolyte balance are achieved/maintained:   Monitor/assess lab values and report abnormal values   Assess and reassess fluid and electrolyte status   Observe for cardiac arrhythmias   Monitor for muscle weakness  Goal: Adequate hydration  Outcome: Progressing  Flowsheets (Taken 07/13/2022 2311 by Kerin Salen, RN)  Adequate hydration:   Assess mucus membranes, skin color, turgor, perfusion and presence of edema   Assess for peripheral, sacral, periorbital and abdominal edema   Monitor and assess vital signs and perfusion     Problem: Diabetes: Glucose Imbalance  Goal: Blood glucose stable at established goal  Outcome: Progressing  Flowsheets (Taken 07/11/2022 1104 by Luz Brazen, RN)  Blood glucose stable at established goal:   Monitor lab values   Monitor intake and output.  Notify LIP if urine output is < 30 mL/hour.   Include patient/family in decisions related to nutrition/dietary selections   Monitor/assess vital signs   Ensure adequate hydration   Ensure patient/family has adequate teaching materials   Ensure appropriate consults are obtained (Nutrition, Diabetes Education, and Case Management)   Assess for hypoglycemia /hyperglycemia   Ensure appropriate diet and assess tolerance    Follow fluid restrictions/IV/PO parameters   Coordinate medication administration with meals, as indicated     Problem: Inadequate Gas Exchange  Goal: Patent Airway maintained  Outcome: Progressing  Flowsheets (Taken 07/14/2022 1859 by Maxwell Marion, RN)  Patent airway maintained:   Position patient for maximum ventilatory efficiency   Reinforce use of ordered respiratory interventions (i.e. CPAP, BiPAP, Incentive Spirometer, Acapella, etc.)   Reposition patient every 2 hours and as needed unless able to self-reposition  Goal: Adequate oxygenation and improved ventilation  Outcome: Progressing  Flowsheets (Taken 07/15/2022 0342 by Hannesschlager, Angelique Blonder, RN)  Adequate oxygenation and improved ventilation:   Assess lung sounds   Monitor SpO2 and treat as needed   Position for maximum ventilatory efficiency   Plan activities to conserve energy: plan rest periods   Increase activity as tolerated/progressive mobility   Consult/collaborate with Respiratory Therapy     Problem: Neurological Deficit  Goal: Neurological status is stable or improving  Outcome: Progressing  Flowsheets (Taken 07/16/2022 0346 by Lia Hopping, RN)  Neurological status is stable or improving:   Monitor/assess/document neurological  assessment (Stroke: every 4 hours)   Monitor/assess NIH Stroke Scale   Re-assess NIH Stroke Scale for any change in status   Observe for seizure activity and initiate seizure precautions if indicated   Perform CAM Assessment     Problem: Every Day - Stroke  Goal: Mobility/Activity is maintained at optimal level for patient  Outcome: Progressing  Flowsheets (Taken 07/15/2022 1159 by Loel Ro, RN)  Mobility/activity is maintained at optimal level for patient:   Encourage independent activity per ability   Consult/collaborate with Physical Therapy and/or Occupational Therapy  Goal: Elimination patterns are normal or improving  Outcome: Progressing  Goal: Neurological status is stable or improving  Outcome:  Progressing  Flowsheets (Taken 07/16/2022 0346 by Lia Hopping, RN)  Neurological status is stable or improving:   Monitor/assess/document neurological assessment (Stroke: every 4 hours)   Monitor/assess NIH Stroke Scale   Re-assess NIH Stroke Scale for any change in status   Observe for seizure activity and initiate seizure precautions if indicated   Perform CAM Assessment  Goal: Core/Quality measure requirements - Daily  Outcome: Progressing  Flowsheets (Taken 07/16/2022 0346 by Lia Hopping, RN)  Core/Quality measure requirements - Daily: Continue stroke education (must include Modifiable Risk Factors, Warning Signs and Symptoms of Stroke, Activation of Emergency Medical System and Follow-up Appointments). Ensure handout has been given and documented.  Goal: Stable vital signs and fluid balance  Outcome: Progressing  Flowsheets (Taken 07/14/2022 1859 by Maxwell Marion, RN)  Stable vital signs and fluid balance:   Position patient for maximum circulation/cardiac output   Monitor intake and output. Notify LIP if urine output is < 30 mL/hour.   Monitor and assess vitals every 4 hours or as ordered and hemodynamic parameters   Encourage oral fluid intake   Apply telemetry monitor as ordered  Goal: Patient will maintain adequate oxygenation  Outcome: Progressing  Flowsheets (Taken 07/14/2022 0542 by Clearnce Sorrel, RN)  Patient will maintain adequate oxygenation:   Suction secretions as needed   Maintain SpO2 of greater than 92%   Sleep Apnea: Assess if previously tested or on CPAP   Sleep Apnea: Encourage patient to speak to PCP regarding sleep apnea/sleep study ([place] only)  Goal: Patient's risk of aspiration will be minimized  Outcome: Progressing  Flowsheets (Taken 07/14/2022 1859 by Maxwell Marion, RN)  Patient's risk of aspiration will be minimized:   Monitor/assess for signs of aspiration (tachypnea, cough, wheezing, clearing throat, hoarseness after eating, decrease in SaO2   Order modified texture diet  as recommend by Speech Pathologist   Keep head of bed up a minimum of 30 degrees when hemodynamically stable  Goal: Nutritional intake is adequate  Outcome: Progressing  Flowsheets (Taken 07/14/2022 0542 by Clearnce Sorrel, RN)  Nutritional intake is adequate:   Consult/collaborate with Clinical Nutritionist   Consult/collaborate with Speech Therapy (swallow evaluations)  Goal: Skin integrity is maintained or improved  Outcome: Progressing  Flowsheets (Taken 07/15/2022 1159 by Loel Ro, RN)  Skin integrity is maintained or improved:   Assess Braden Scale every shift   Turn or reposition patient every 2 hours or as needed unless able to reposition self   Increase activity as tolerated/progressive mobility   Monitor patient's hygiene practices   Encourage use of lotion/moisturizer on skin   Keep skin clean and dry   Avoid shearing   Relieve pressure to bony prominences   Collaborate with Wound, Ostomy, and Continence Nurse   Keep head of bed 30 degrees or less (unless contraindicated)  Problem: Pain interferes with ability to perform ADL  Goal: Pain at adequate level as identified by patient  Outcome: Progressing  Flowsheets (Taken 07/15/2022 1159 by Loel Ro, RN)  Pain at adequate level as identified by patient:   Assess for risk of opioid induced respiratory depression, including snoring/sleep apnea. Alert healthcare team of risk factors identified.   Identify patient comfort function goal   Assess pain on admission, during daily assessment and/or before any "as needed" intervention(s)   Reassess pain within 30-60 minutes of any procedure/intervention, per Pain Assessment, Intervention, Reassessment (AIR) Cycle   Evaluate patient's satisfaction with pain management progress   Evaluate if patient comfort function goal is met   Offer non-pharmacological pain management interventions   Consult/collaborate with Physical Therapy, Occupational Therapy, and/or Speech Therapy   Include patient/patient care companion in  decisions related to pain management as needed

## 2022-07-16 NOTE — Progress Notes (Addendum)
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 and  cystectomy on 06/28/22  - complicated by small bowel enterotomy and blood loss   New onset afib - on amio gtt + metoprolol  High grade urothelial bladder CA: s/p bilateral nephrectomies and cystectomy c/b mall bowel enterotomy and blood loss (~1L) on 06/28/22  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 Vaughan Basta via epic chat     Delice Bison, MD  [facility]  [phone] (office)  X [phone] (FFX Spectra-Link)      Subjective: awake, responds but remains confused       Review of Systems:   Unable to assess, patient is confused     Physical Exam:     Vitals:    07/16/22 0000 07/16/22 0400 07/16/22 0747 07/16/22 1210   BP: 154/65 153/65 147/65 120/54   Pulse: 75 80 68 79   Resp: (!) 24 (!) 26 (!) 49 22   Temp: 99.4 F (37.4 C) 98.5 F (36.9 C)  98.8 F (37.1 C)   TempSrc: Axillary Axillary  Oral   SpO2: 95% 95% 98% 97%   Weight:  86.5 kg (190 lb 11.2  oz)     Height:           Intake and Output Summary (Last 24 hours) at Date Time    Intake/Output Summary (Last 24 hours) at 07/16/2022 1525  Last data filed at 07/16/2022 0400  Gross per 24 hour   Intake 960.5 ml   Output 0 ml   Net 960.5 ml       General: confused, on NC, NGT in place   Cardiovascular: regular rate and rhythm  Lungs: bilateral air entry  Abdomen: soft  Extremities: LUE edema  Access: R permacath   Meds:      Scheduled Meds: PRN Meds:    acetaminophen, 650 mg, Oral, 4 times per day  albuterol-ipratropium, 3 mL, Nebulization, Q6H WA  aspirin, 81 mg, per NG tube, Daily  glycopyrrolate, 0.2 mg, Intravenous, Q8H  insulin glargine, 7 Units, Subcutaneous, QAM  insulin lispro, 2-10 Units, Subcutaneous, Q4H SCH  meropenem, 500 mg, Intravenous, Q24H  metoprolol tartrate, 12.5 mg, per NG tube, Q12H SCH  micafungin, 100 mg, Intravenous, Q24H  ondansetron, 4 mg, Intravenous, Once  pantoprazole, 40 mg, Intravenous, BID  sevelamer carbonate, 0.8 g, Oral, TID MEALS  thiamine,  100 mg, Intravenous, Q24H SCH          Continuous Infusions:   amiodarone 0.5 mg/min (07/16/22 1057)      albuterol-ipratropium, 3 mL, Q6H PRN  benzocaine, 1 spray, TID PRN  dextrose, 15 g of glucose, PRN   Or  dextrose, 12.5 g, PRN   Or  dextrose, 12.5 g, PRN   Or  glucagon (rDNA), 1 mg, PRN  diphenhydrAMINE, 25 mg, Q6H PRN  diphenhydrAMINE, 25 mg, Once PRN  hydrALAZINE, 10 mg, Q3H PRN  HYDROmorphone, 1 mg, Q3H PRN  labetalol, 10 mg, Q15 Min PRN  lidocaine 2% jelly, , Once PRN  naloxone, 0.2 mg, PRN  oxyCODONE, 5 mg, Q4H PRN  phenol, 1 spray, Q2H PRN              Labs:     Recent Labs   Lab 07/16/22  0244 07/15/22  0427 07/14/22  0435   WBC 13.46* 11.90* 12.28*   Hemoglobin 7.9* 8.1* 7.7*   Hematocrit 24.3* 24.6* 23.5*   Platelet Count 242 236 224     Recent Labs   Lab 07/16/22  0244 07/15/22  0427 07/14/22  0435   Sodium 134* 135 138   Potassium 3.4* 3.7 3.4*   Chloride 98* 98* 100   CO2 27 23 27    BUN 32* 44* 24*   Creatinine 2.5* 3.6* 2.4*    Calcium 8.2 8.2 8.4   Albumin 1.8*  --  1.8*   Phosphorus 2.1* 3.4 2.9   Magnesium 1.7 1.8 1.8   Glucose 166* 145* 116*   GFR 18.4* 11.9* 19.3*           Signed by: Delice Bison, MD, MD

## 2022-07-16 NOTE — SLP Progress Note (Signed)
[facility]  Speech Therapy Treatment Note    Patient:  Shelby Barron MRN#:  [id number]  Unit:  [facility] Room/Bed:  F6213/Y8657-Q      Time of treatment:   SLP Received On: 07/16/22  Start Time: 1100  Stop Time: 1120  Time Calculation (min): 20 min       Personal Protective Equipment (PPE)  Gloves and Procedure Mask with Shield    Subjective:     Pt is alert/ verbal , family present       Objective: Oropharyngeal analysis during PO trials in order to determine LRD,development and training of swallow strategies in order to improve swallow function and decrease risk of penetration/aspiration;  therapeutic feedings by SLP  and safe swallowing precautions education.       Assessment: pt presents with some improvement in mentation and swallow function this session. Pt presents with moderately reduced lip closure , delay in bolus transit and mild delay in pharyngeal swallow trigger with mildly reduced HLE upon subjective digital palpation. Pt had significant lingual coating that was cleared as trials progressed. Pt tolerated 3 oz of applesauce with double swallow per bolus and min to mod lingual residue. Pt tolerated 2/3 cup of thickened juice with one episode of transient throat clearing and a delayed cough that resulted in expectoration of thick yellow phlegm        Plan/Recommendations:  NPO  with only ice chips for oral comfort           Charges Minutes   SLP treat    Swallow treat 20       07/16/2022  Presley Raddle MS [facility] SLP   07/16/2022 1:55 PM  [phone]

## 2022-07-16 NOTE — Progress Notes (Signed)
ID PROGRESS NOTE       Office: [phone]    Date Time: 07/16/22 2:24 PM  Patient Name: Shelby Barron, Shelby Barron      Updated problem List   Admitted 6/10 for scheduled cystectomy and completion nephrectomy     RCC s/p b/l nephrectomy, Newly diagnosed urothelial CA  - Prior RCC s/p partial b/l nephrectomy  - ESRD on PD previously -> HD 05/2022  - 05/2022 newly diagnosed urothelial Ca  - Admitted 6/10 for planned cystectomy, completion nephrectomy     Small bowel enterotomy  - Complication of her 6/10 procedure, repaired by gen surg  - Left in discontinuity given significant bleeding  - 6/12 RTOR for re-anastomosis, closure. Wound vac in place.      Leukocytosis, abdominal fluid collections, ?PNA  - Worsening WBCs since 6/15  - CT- 6/18 - post-op changes, small ascites, small collection of fluid/gas anterior midline abdomen, may be post-op, but superimposed infection cannot be ruled out  - Sputum Cx light GNRs     Chronic Conditions:  - COPD  - CVA 2004  - T2DM  - RCC s/p partial b/l nephrectomy  - [name]  - Urothelial cancer     Allergies:  - Hives with sulfa    Assessment:   Continues to be very weak and withdrawn. Not toxic, WBC improved. No fever. Belly is benign. She has been on almost 3 weeks of IV abx covering gut flora, due to the intra-abdominal collections. Hard to assess clinically but seems to be improving. Tolerating tube feeds making stools. Back on dialysis with new permacath.    Recommendations:   Continue IV abx thrut he weekend and possibly stop after that  Monitor gut function.  Surgery assessing midline wound    Antibiotics:   #18 abx  #11 meropenem 500 mg IV daily  #9 micafungin 100 mg IV daily    IV lines:   R permacath 6/26  L midline 6/13     Allergies:     Allergies   Allergen Reactions    Metrizamide Shortness Of Breath and Nausea Only    Rosuvastatin Other (See Comments)     myopathy    myopathy   myopathy    myopathy myopathy      myopathy    myopathy, myopathy    Iodinated Contrast Media  Other (See Comments)     Nausea and SOB per patient   On Dialysisi    Nausea and SOB per patient    Nausea and SOB per patient      Nausea and SOB per patient   Nausea and SOB per patient    Nausea and SOB per patient      Nausea and SOB per patient Nausea and SOB per patient      Nausea and SOB per patient Nausea and SOB per patient Nausea and SOB per patient      Nausea and SOB per patient  Nausea and SOB per patient Nausea and SOB per patient    Nausea and SOB per patient   Nausea and SOB per patient    Pioglitazone      Weight gain    Sulfa Antibiotics Hives     blister       Review of Systems:   Weak. Nonverbal. Oxygenating ok on nasal prongs. NG tube in place. No urine output. afebrile    Physical Exam:   BP 120/54   Pulse 79   Temp 98.8 F (37.1 C) (Oral)  Resp 22   Ht 1.575 m (5\' 2" )   Wt 86.5 kg (190 lb 11.2 oz)   SpO2 97%   BMI 34.88 kg/m   Weak withdrawn, no distress  HENT with NG tube and with nasal oxygen  Lungs coarse breath sounds, without rales, wheezing  Cv normal without murmur, rub, or gallop  Abdomen obese, soft nontender, with large midline wound vac.  Extrem thick  without phlebitis or edema.   No rash.     Select labs:     Recent Labs   Lab 07/16/22  0244   WBC 13.46*   Hemoglobin 7.9*   Hematocrit 24.3*   Platelet Count 242     Recent Labs   Lab 07/16/22  0244   Sodium 134*   Potassium 3.4*   Chloride 98*   CO2 27   BUN 32*   Creatinine 2.5*   Calcium 8.2   Albumin 1.8*   Protein, Total 4.6*   Bilirubin, Total 0.2   Alkaline Phosphatase 129*   ALT 11   AST (SGOT) 15   Glucose 166*        Rads:   none    Signed by: Guadalupe Maple, MD, MD

## 2022-07-16 NOTE — Plan of Care (Signed)
Problem: Moderate/High Fall Risk Score >5  Goal: Patient will remain free of falls  Outcome: Progressing  Flowsheets (Taken 07/15/2022 2200)  High (Greater than 13):   HIGH-Consider use of low bed   HIGH-Initiate use of floor mats as appropriate   HIGH-Apply yellow "Fall Risk" arm band   HIGH-Bed alarm on at all times while patient in bed     Problem: Renal Instability  Goal: Fluid and electrolyte balance are achieved/maintained  Outcome: Progressing  Flowsheets (Taken 07/16/2022 0346)  Fluid and electrolyte balance are achieved/maintained:   Monitor/assess lab values and report abnormal values   Assess and reassess fluid and electrolyte status   Observe for cardiac arrhythmias   Monitor for muscle weakness   Follow fluid restrictions/IV/PO parameters     Problem: Neurological Deficit  Goal: Neurological status is stable or improving  Outcome: Progressing  Flowsheets (Taken 07/16/2022 0346)  Neurological status is stable or improving:   Monitor/assess/document neurological assessment (Stroke: every 4 hours)   Monitor/assess NIH Stroke Scale   Re-assess NIH Stroke Scale for any change in status   Observe for seizure activity and initiate seizure precautions if indicated   Perform CAM Assessment     Problem: Every Day - Stroke  Goal: Neurological status is stable or improving  Outcome: Progressing  Flowsheets (Taken 07/16/2022 0346)  Neurological status is stable or improving:   Monitor/assess/document neurological assessment (Stroke: every 4 hours)   Monitor/assess NIH Stroke Scale   Re-assess NIH Stroke Scale for any change in status   Observe for seizure activity and initiate seizure precautions if indicated   Perform CAM Assessment  Goal: Core/Quality measure requirements - Daily  Outcome: Progressing  Flowsheets (Taken 07/16/2022 0346)  Core/Quality measure requirements - Daily: Continue stroke education (must include Modifiable Risk Factors, Warning Signs and Symptoms of Stroke, Activation of Emergency Medical  System and Follow-up Appointments). Ensure handout has been given and documented.

## 2022-07-16 NOTE — Progress Notes (Signed)
[facility]  PROGRESS NOTE      Patient: Shelby Barron  Date:07/08/2022   LOS: 18 Days  Admission Date: 06/28/2022   MRN: [id number]  Attending: Gayleen Orem, MD  When on service as the attending, please contact me on Epic Secure Chat from 7 AM - 7 PM for non-urgent issues. For urgent matters use XTend page from 7 AM - 7 PM.       ASSESSMENT/PLAN     Shelby Barron is a 82 y.o. female admitted with Bladder cancer    Interval Summary: [name], [name], type 2 diabetes, renal cell carcinoma status post partial bilateral nephrectomy, [name], new diagnosis of urothelial cancer initally presented on 6/10 for elective cystectomy and completion nephrectomy, [name], [name], extubated and off pressors since 6/13.     She is hypoactive and delirious. Surgery recommended NGT and CTAP today. Plan to start TF today. CTH pending.  Has [name] T/Th/Sat. Nephrology following     Developed new onset afib RVR today during HD. Stopped norvasc switched to metoprolol and gave additional IV metoprolol. Consulted cardiology. Discussed with ICU requested a consult given deterioration. Recommended repeat blood cultures and broadening antibiotics to meropenem. Palliative care consulted.    Rising WBC. Blood cultures NGTD. Patient tachypneic CXR with pulm edema. Discussed with nephrology giving 3 mg IV bumex. Had dark emesis. Vitals stable. TFs held, started IV PPI, follow AM labs. Discussed with GI and surgery.    No further episodes of Afib RVR now on amiodarone gtt and PO metoprolol. Hgb stable. Repeating CXR, clear. Started robinul for upper airway secretions/rattling. Clinically improving.    New permacath placement planned for this week. Then hopefully NGT can come out if patient tolerating PO and discharge planning.    Patient Active Hospital Problem List:  #S/P Cystectomy and completion  nephrectomy complicated by small bowel enterotomy   #Mixed shock - hemorrhagic vs septic: Resolved  - 6/10 small bowel enteretomy with 1L EBL. Went to the OR on June 12 for small bowel anastomosis, closure of mesenteric defect, closure of abdominal wall with wound VAC application  - Failed SLP eval . Wcont tube feeds per nutrition recs  -Wound VAC changed 6/15  - Surgery consulted, recommend CTAP with PO contrast to eval for intra-abdominal source of infection given uptrending WBC. Surgery ordered pre-medication with steroid/benadryl given h/o contrast allergy, showed no leak or enhancing fluid collection  -Continue meropenem/micafungin per ID recommendation, ID will determine duration of treatment  -Follow-up with general surgery and ID  -Continue tube feeding  -Monitor CBC daily    #Acute CVA on the right frontal lobe  #History of CVA  #Encephalopathy/delirium, improving  - Hold home plavix  - Adhere to day/night cycles and administer constant reorientation.   -Left upper extremity not moving at all unclear duration  -CT head showed right frontal infarction, neurology consulted recommended to start aspirin, discussed with GI and GI cleared, discussed with daughter and daughter are agreeable.  -Pending MRI  -Neurology following along  -Discussed with neurology, recommended blood pressure target systolic between 1 20-1 60 and also recommended continue NIH stroke scale monitoring  -Stroke order set placed in  -Carotid Doppler showed plaque with no critical stenosis, MRI head pending  -Neurology recommended to wait 10 days prior to initiation of anticoagulation given the size of  the stroke.    #Acute hypoxemic respiratory failure  #COPD  Extubated on 6/13, currently on room air  Maintain SaO2 > 92%  Continue as needed neb treatment    #ESRD  HD Tues/Thurs/Sat  Continue dialysis per nephrology    #Nonfunctioning right permacath  #Right Quinton IJ cath  I have reviewed the nonfunctional permacath and right Quinton IJ  cath and replaced with a new permacath on 6/26.    #HTN  On metoprolol   PRN IV labetalol for SBP > 180     New onset Afib with RVR  - cardiology following  -CHA2DS2-VASc score is 7, 11% yearly risk of stroke  - stable now on amiodarone gtt and metoprolol, cardiology recommended to switch amiodarone to 200 mg daily on 07/16/2022  -GI and general surgery cleared for anticoagulation  -Neurology recommended to hold anticoagulation for 10 days given the size of stroke      #Renal cell carcinoma  #Urothelial carcinoma  #Thrombocytopenia  - VTE ppx: SCDs, subcutaneous heparin     #Diabetes Mellitus type 2  -Sliding scale insulin          Nutrition: NPO, tube feeds  Malnutrition Documentation    Moderate Malnutrition related to inadequate nutritional intake in the setting of acute illness as evidenced by <50% EER > 5 days, mild muscle losses (temporalis, deltoid) - new                 Patient has BMI=Body mass index is 34.88 kg/m.  Diagnosis: Obesity based on BMI criteria                                           Recent Labs   Lab 07/16/22  0244 07/15/22  0427 07/14/22  0435   Hemoglobin 7.9* 8.1* 7.7*   Hematocrit 24.3* 24.6* 23.5*   MCV 91.4 91.8 91.8   WBC 13.46* 11.90* 12.28*   Platelet Count 242 236 224     Recent Labs   Lab 06/02/22  0514   Iron 62   TIBC 145*   Iron Saturation 43       Code Status: Full Code    Dispo: pending improved mental status    Family Contact: Updated daughter    DVT Prophylaxis:   Current Facility-Administered Medications (Includes Only Anticoagulants, Misc. Hematological)   Medication Dose Route Last Admin   None          CHART  REVIEW & DISCUSSION     The following chart items were reviewed as of 3:19 PM on 07/16/22:  [x]  Lab Results [x]  Imaging Results   [x]  Problem List  [x]  Current Orders [x]  Current Medications  [x]  Allergies  []  Code Status []  Previous Notes   []  SDoH    The management and plan of care for this patient was discussed with the following specialty consultants:  [x]   Cardiology  [x]  Gastroenterology                 []  Infectious Disease  []  Pulmonology [x]  Neurology                [x]  Nephrology  []  Neurosurgery []  Orthopedic Surgery  []  Heme/Onc  []  General Surgery []  Psychiatry                                   []   Palliative    SUBJECTIVE     [name] evaluated after patient was given Dilaudid for pain  Able to respond to her name  Unable to get review of system  No events reported overnight    MEDICATIONS     Current Facility-Administered Medications   Medication Dose Route Frequency    acetaminophen  650 mg Oral 4 times per day    albuterol-ipratropium  3 mL Nebulization Q6H WA    aspirin  81 mg per NG tube Daily    glycopyrrolate  0.2 mg Intravenous Q8H    insulin glargine  7 Units Subcutaneous QAM    insulin lispro  2-10 Units Subcutaneous Q4H SCH    meropenem  500 mg Intravenous Q24H    metoprolol tartrate  12.5 mg per NG tube Q12H [facility]    micafungin  100 mg Intravenous Q24H    ondansetron  4 mg Intravenous Once    pantoprazole  40 mg Intravenous BID    sevelamer carbonate  0.8 g Oral TID MEALS    thiamine  100 mg Intravenous Q24H SCH       PHYSICAL EXAM     Vitals:    07/16/22 1210   BP: 120/54   Pulse: 79   Resp: 22   Temp: 98.8 F (37.1 C)   SpO2: 97%       Temperature: Temp  Min: 98.5 F (36.9 C)  Max: 99.4 F (37.4 C)  Pulse: Pulse  Min: 65  Max: 87  Respiratory: Resp  Min: 22  Max: 49  Non-Invasive BP: BP  Min: 105/49  Max: 154/65  Pulse Oximetry SpO2  Min: 95 %  Max: 98 %    Intake and Output Summary (Last 24 hours) at Date Time    Intake/Output Summary (Last 24 hours) at 07/16/2022 1519  Last data filed at 07/16/2022 0400  Gross per 24 hour   Intake 960.5 ml   Output 0 ml   Net 960.5 ml         GEN APPEARANCE: Normal;  A&OX0  HEENT: PERLA; EOMI; Conjunctiva Clear  NECK: Supple; No bruits  CVS: RRR, S1, S2; No M/G/R  LUNGS: CTAB; wheezing improved, no Rhonchi: No rales  ABD: Soft; No TTP; + Normoactive BS  EXT: No edema; Pulses 2+ and intact  NEURO: moving  right upper extremity spontaneously, able to move lower extremities for pain but minimal, no movement at all on the left upper extremity  MENTAL STATUS: Alert oriented x 0, able to follow simple commands, unable to get detailed exam given mental status    LABS     Recent Labs   Lab 07/16/22  0244 07/15/22  0427 07/14/22  0435   WBC 13.46* 11.90* 12.28*   RBC 2.66* 2.68* 2.56*   Hemoglobin 7.9* 8.1* 7.7*   Hematocrit 24.3* 24.6* 23.5*   MCV 91.4 91.8 91.8   Platelet Count 242 236 224       Recent Labs   Lab 07/16/22  0244 07/15/22  0427 07/14/22  0435 07/13/22  0348 07/12/22  0418   Sodium 134* 135 138 138 138   Potassium 3.4* 3.7 3.4* 3.5 3.9   Chloride 98* 98* 100 100 101   CO2 27 23 27 23 21    BUN 32* 44* 24* 45* 77*   Creatinine 2.5* 3.6* 2.4* 4.1* 5.9*   Glucose 166* 145* 116* 182* 150*   Calcium 8.2 8.2 8.4 8.0 8.4   Magnesium 1.7 1.8 1.8 1.8 2.0  Recent Labs   Lab 07/16/22  0244 07/14/22  0435   ALT 11 11   AST (SGOT) 15 18   Bilirubin, Total 0.2 0.2   Albumin 1.8* 1.8*   Alkaline Phosphatase 129* 85             Recent Labs   Lab 07/14/22  0435   INR 1.1   PT 12.4   PTT 28         Microbiology Results (last 15 days)       Procedure Component Value Units Date/Time    Culture, Blood, Aerobic And Anaerobic [[id number]] Collected: 07/07/22 0355    Order Status: Completed Specimen: Blood, Venous Updated: 07/12/22 0700     Culture Blood No growth at 5 days    Culture, Blood, Aerobic And Anaerobic [[id number]] Collected: 07/06/22 1837    Order Status: Completed Specimen: Blood, Venous Updated: 07/12/22 0000     Culture Blood No growth at 5 days    Culture, Sputum and Lower Respiratory [[id number]]     Order Status: Sent Specimen: Sputum, Induced     Culture, Sputum and Lower Respiratory [[id number]]  (Abnormal) Collected: 07/04/22 1034    Order Status: Completed Specimen: Sputum, Suctioned Updated: 07/12/22 1756     Culture Respiratory Heavy growth of Mixed upper respiratory flora      Light growth of Gram negative  rod     Comment: Questionable significance due to low quantity.  No further work.        Gram Stain Few WBCs      Rare Squamous epithelial cells      Few Mixed respiratory flora    Fungal Identification, Molds [[id number]] Collected: 07/04/22 1034    Order Status: Sent Specimen: Sputum, Suctioned Updated: 07/14/22 1251             RADIOLOGY     US Carotid Duplex Dopp Comp Bilateral    Result Date: 07/15/2022   1. Plaque formation in the right internal carotid artery bulb with elevated velocities. Stenosis is estimated at 50-69%. 2. Plaque in the left internal carotid artery with normal velocities. Any stenosis is less than 50%. Criteria for stenosis and velocity parameters are based on the guidelines of the SRU (Society of Radiologists in Ultrasound). Grant et al: Carotid Artery Stenosis: Grayscale and Doppler Ultrasound Diagnosis-- Society of Radiologists in Ultrasound Consensus Conference Ultrasound Quarterly. Volume 19, Number 4; December, 2003. Recommendations updated October 2021 Verne Carrow is an [facility] accredited facility Suszanne Finch, MD 07/15/2022 9:41 AM    Tunneled Cath Removal (Permcath)    Result Date: 07/14/2022  1. Nonfunctioning tunneled dialysis catheter as described above. 2. Successful placement of a new 19 cm Equistream catheter as described above. Dimitrios Papadouris, MD 07/14/2022 4:06 PM    Tunneled Cath Placement (Permcath)    Result Date: 07/14/2022  1. Nonfunctioning tunneled dialysis catheter as described above. 2. Successful placement of a new 19 cm Equistream catheter as described above. Dimitrios Papadouris, MD 07/14/2022 4:06 PM    MRI Brain WO Contrast    Result Date: 07/13/2022   1.Patchy areas of acute/recent infarction involving the cerebral hemispheres bilaterally. The infarcts have a configuration suggesting watershed territory infarcts. 2.Chronic ischemic changes as discussed. Georgann Housekeeper, MD 07/13/2022 10:28 PM    CT Head WO Contrast    Result Date: 07/13/2022   1.New region of loss of  gray-white matter differentiation in the right frontal lobe, concerning for acute infarction. 2.A few additional areas of poor gray-white matter differentiation for  example in the left occipital lobe may be artifactual but could be correlated with MRI. 3.No evidence of acute intracranial hemorrhage or significant mass effect. These urgent results were discussed with and acknowledged by Dr. Don Broach on 07/13/2022 3:20 PM. Vara Guardian, MD 07/13/2022 3:21 PM    XR Chest AP Portable    Result Date: 07/10/2022  Interstitial markings have slightly increased, suggesting minimal edema. No other change. Wilmon Pali, MD 07/10/2022 4:49 PM    XR Chest AP Portable    Result Date: 07/07/2022   There is mild interstitial prominence suggesting mild congestive heart failure/fluid overload in the setting of shortness of breath [name], MD 07/07/2022 12:32 PM    XR Chest AP Portable    Result Date: 07/06/2022   Hypoventilation with basilar atelectasis. Charlott Rakes, MD 07/06/2022 3:35 PM    CT Abdomen Pelvis W IV And PO Cont    Result Date: 07/06/2022  1.Postoperative changes from bilateral nephrectomy and cystectomy. 2.Small amount of ascites including some areas which appear loculated. Small collections of fluid and gas in the anterior midline abdomen may be postoperative but superimposed infection is not excluded. No rim-enhancing fluid collections or extravasation of oral contrast to suggest anastomotic leak. Demetrios Isaacs, MD 07/06/2022 5:51 AM    CT Head WO Contrast    Result Date: 07/06/2022   1.No acute intracranial abnormality is seen. Leandro Reasoner, MD 07/06/2022 3:56 AM    XR Abdomen AP    Result Date: 07/05/2022  Enteric tube terminates in the distal stomach. Elease Etienne, DO 07/05/2022 12:24 PM    XR Chest AP Portable    Result Date: 06/30/2022  1. Lines and tubes in adequate position. No pneumothorax. 2. Trace left pleural effusion and bibasilar atelectasis. Jasmine December D'Heureux, MD 06/30/2022 4:53 PM    XR Chest AP Portable    Result  Date: 06/30/2022  Catheters as described. Pankaj Dominica, MD 06/30/2022 2:07 PM    XR Chest AP Portable    Result Date: 06/28/2022   Right mainstem bronchial intubation. The tube should be withdrawn about 4 cm. Dr. Tildon Husky is aware. Wynema Birch, MD 06/28/2022 4:56 PM    XR OR MISCOUNT    Result Date: 06/28/2022   No unexpected foreign body. These critical results were discussed with and acknowledged by Angie R., the circulating nurse, who will communicate the findings to Everardo All, MD on 06/28/2022 3:02 PM. Bosie Helper, MD 06/28/2022 3:03 PM   Echo Results       None          No results found for this or any previous visit.    Signed,  Gayleen Orem, MD  3:19 PM 07/16/2022

## 2022-07-17 ENCOUNTER — Inpatient Hospital Stay: Payer: No Typology Code available for payment source

## 2022-07-17 LAB — LAB USE ONLY - CBC WITH DIFFERENTIAL
Absolute Basophils: 0.06 10*3/uL (ref 0.00–0.08)
Absolute Eosinophils: 0.08 10*3/uL (ref 0.00–0.44)
Absolute Immature Granulocytes: 0.22 10*3/uL — ABNORMAL HIGH (ref 0.00–0.07)
Absolute Lymphocytes: 1.45 10*3/uL (ref 0.42–3.22)
Absolute Monocytes: 1.12 10*3/uL — ABNORMAL HIGH (ref 0.21–0.85)
Absolute Neutrophils: 12.1 10*3/uL — ABNORMAL HIGH (ref 1.10–6.33)
Absolute nRBC: 0 10*3/uL (ref <=0.00)
Basophils %: 0.4 %
Eosinophils %: 0.5 %
Hematocrit: 24.8 % — ABNORMAL LOW (ref 34.7–43.7)
Hemoglobin: 7.9 g/dL — ABNORMAL LOW (ref 11.4–14.8)
Immature Granulocytes %: 1.5 %
Lymphocytes %: 9.6 %
MCH: 29.3 pg (ref 25.1–33.5)
MCHC: 31.9 g/dL (ref 31.5–35.8)
MCV: 91.9 fL (ref 78.0–96.0)
MPV: 10.9 fL (ref 8.9–12.5)
Monocytes %: 7.5 %
Neutrophils %: 80.5 %
Platelet Count: 286 10*3/uL (ref 142–346)
Preliminary Absolute Neutrophil Count: 12.1 10*3/uL — ABNORMAL HIGH (ref 1.10–6.33)
RBC: 2.7 10*6/uL — ABNORMAL LOW (ref 3.90–5.10)
RDW: 16 % — ABNORMAL HIGH (ref 11–15)
WBC: 15.03 10*3/uL — ABNORMAL HIGH (ref 3.10–9.50)
nRBC %: 0 /100 WBC (ref <=0.0)

## 2022-07-17 LAB — MAGNESIUM: Magnesium: 1.7 mg/dL (ref 1.6–2.6)

## 2022-07-17 LAB — WHOLE BLOOD GLUCOSE POCT
Whole Blood Glucose POCT: 116 mg/dL — ABNORMAL HIGH (ref 70–100)
Whole Blood Glucose POCT: 144 mg/dL — ABNORMAL HIGH (ref 70–100)
Whole Blood Glucose POCT: 161 mg/dL — ABNORMAL HIGH (ref 70–100)
Whole Blood Glucose POCT: 163 mg/dL — ABNORMAL HIGH (ref 70–100)
Whole Blood Glucose POCT: 168 mg/dL — ABNORMAL HIGH (ref 70–100)
Whole Blood Glucose POCT: 174 mg/dL — ABNORMAL HIGH (ref 70–100)
Whole Blood Glucose POCT: 197 mg/dL — ABNORMAL HIGH (ref 70–100)

## 2022-07-17 LAB — BASIC METABOLIC PANEL
Anion Gap: 14 (ref 5.0–15.0)
BUN: 51 mg/dL — ABNORMAL HIGH (ref 7–21)
CO2: 23 mEq/L (ref 17–29)
Calcium: 8.6 mg/dL (ref 7.9–10.2)
Chloride: 98 mEq/L — ABNORMAL LOW (ref 99–111)
Creatinine: 3.7 mg/dL — ABNORMAL HIGH (ref 0.4–1.0)
GFR: 11.5 mL/min/{1.73_m2} — ABNORMAL LOW (ref >=60.0)
Glucose: 164 mg/dL — ABNORMAL HIGH (ref 70–100)
Potassium: 3.8 mEq/L (ref 3.5–5.3)
Sodium: 135 mEq/L (ref 135–145)

## 2022-07-17 LAB — PHOSPHORUS: Phosphorus: 3.5 mg/dL (ref 2.3–4.7)

## 2022-07-17 MED ORDER — DARBEPOETIN ALFA 40 MCG/0.4ML IJ SOSY
0.4500 ug/kg | PREFILLED_SYRINGE | INTRAMUSCULAR | Status: DC
Start: 2022-07-17 — End: 2022-08-03
  Administered 2022-07-17 – 2022-07-24 (×2): 40 ug via SUBCUTANEOUS
  Filled 2022-07-17 (×4): qty 0.4

## 2022-07-17 MED ORDER — ACETAMINOPHEN 325 MG PO TABS
650.0000 mg | ORAL_TABLET | Freq: Four times a day (QID) | ORAL | Status: DC | PRN
Start: 2022-07-17 — End: 2022-08-11
  Administered 2022-07-17 – 2022-08-06 (×12): 650 mg via ORAL
  Filled 2022-07-17 (×11): qty 2

## 2022-07-17 NOTE — Progress Notes (Signed)
[facility]  PROGRESS NOTE      Patient: Shelby Barron  Date:07/08/2022   LOS: 19 Days  Admission Date: 06/28/2022   MRN: [id number]  Attending: Gayleen Orem, MD  When on service as the attending, please contact me on Epic Secure Chat from 7 AM - 7 PM for non-urgent issues. For urgent matters use XTend page from 7 AM - 7 PM.       ASSESSMENT/PLAN     Shelby Barron is a 82 y.o. female admitted with Bladder cancer    Interval Summary: [name], [name], type 2 diabetes, renal cell carcinoma status post partial bilateral nephrectomy, [name], new diagnosis of urothelial cancer initally presented on 6/10 for elective cystectomy and completion nephrectomy, [name], [name], extubated and off pressors since 6/13.     She is hypoactive and delirious. Surgery recommended NGT and CTAP today. Plan to start TF today. CTH pending.  Has [name] T/Th/Sat. Nephrology following     Developed new onset afib RVR today during HD. Stopped norvasc switched to metoprolol and gave additional IV metoprolol. Consulted cardiology. Discussed with ICU requested a consult given deterioration. Recommended repeat blood cultures and broadening antibiotics to meropenem. Palliative care consulted.    Rising WBC. Blood cultures NGTD. Patient tachypneic CXR with pulm edema. Discussed with nephrology giving 3 mg IV bumex. Had dark emesis. Vitals stable. TFs held, started IV PPI, follow AM labs. Discussed with GI and surgery.    No further episodes of Afib RVR now on amiodarone gtt and PO metoprolol. Hgb stable. Repeating CXR, clear. Started robinul for upper airway secretions/rattling. Clinically improving.    New permacath placement planned for this week. Then hopefully NGT can come out if patient tolerating PO and discharge planning.    Patient Active Hospital Problem List:  #S/P Cystectomy and completion  nephrectomy complicated by small bowel enterotomy   #Mixed shock - hemorrhagic vs septic: Resolved  #Leukocytosis  - 6/10 small bowel enteretomy with 1L EBL. Went to the OR on June 12 for small bowel anastomosis, closure of mesenteric defect, closure of abdominal wall with wound VAC application  - Failed SLP eval . Wcont tube feeds per nutrition recs  -Wound VAC changed 6/15  - Surgery consulted, recommend CTAP with PO contrast to eval for intra-abdominal source of infection given uptrending WBC. Surgery ordered pre-medication with steroid/benadryl given h/o contrast allergy, showed no leak or enhancing fluid collection  -Continue meropenem/micafungin per ID recommendation, ID will determine duration of treatment  -Follow-up with general surgery and ID  -Continue tube feeding  -WBC is trending up  -Repeat chest x-ray showed complete resolution of the interstitial edema    #Acute CVA on the right frontal lobe  #History of CVA  #Encephalopathy/delirium, improving  - Hold home plavix  - Adhere to day/night cycles and administer constant reorientation.   -Left upper extremity not moving at all unclear duration  -CT head showed right frontal infarction, neurology consulted recommended to start aspirin, discussed with GI and GI cleared, discussed with daughter and daughter are agreeable.  -Pending MRI  -Neurology following along  -Discussed with neurology, recommended blood pressure target systolic between 1 20-1 60 and also recommended continue NIH stroke scale monitoring  -Stroke order set placed in  -Carotid Doppler showed plaque with no critical stenosis, MRI head pending  -Neurology  recommended to wait 10 days prior to initiation of anticoagulation given the size of the stroke.  -Repeat head CT done on 6/29 for lethargy showed no acute finding    #Acute hypoxemic respiratory failure  #COPD  Extubated on 6/13, currently on room air  Maintain SaO2 > 92%  Continue as needed neb treatment  X-ray repeated on 6/29 showed  resolution of interstitial edema    #ESRD  HD Tues/Thurs/Sat  Continue dialysis per nephrology    #Nonfunctioning right permacath  #Right [place] IJ cath  I have reviewed the nonfunctional permacath and right [place] IJ cath and replaced with a new permacath on 6/26.    #HTN  On metoprolol   PRN IV labetalol for SBP > 180     New onset Afib with RVR  - cardiology following  -CHA2DS2-VASc score is 7, 11% yearly risk of stroke  - stable now on amiodarone gtt and metoprolol, cardiology recommended to switch amiodarone to 200 mg daily on 07/16/2022  -GI and general surgery cleared for anticoagulation  -Neurology recommended to hold anticoagulation for 10 days given the size of stroke      #Renal cell carcinoma  #Urothelial carcinoma  #Thrombocytopenia  - VTE ppx: SCDs, subcutaneous heparin     #Diabetes Mellitus type 2  -Sliding scale insulin          Nutrition: NPO, tube feeds  Malnutrition Documentation    Moderate Malnutrition related to inadequate nutritional intake in the setting of acute illness as evidenced by <50% EER > 5 days, mild muscle losses (temporalis, deltoid) - new                 Patient has BMI=Body mass index is 39.07 kg/m.  Diagnosis: Obesity based on BMI criteria                                           Recent Labs   Lab 07/17/22  0431 07/16/22  0244 07/15/22  0427   Hemoglobin 7.9* 7.9* 8.1*   Hematocrit 24.8* 24.3* 24.6*   MCV 91.9 91.4 91.8   WBC 15.03* 13.46* 11.90*   Platelet Count 286 242 236     Recent Labs   Lab 06/02/22  0514   Iron 62   TIBC 145*   Iron Saturation 43       Code Status: Full Code    Dispo: pending improved mental status    Family Contact: Updated daughter    DVT Prophylaxis:   Current Facility-Administered Medications (Includes Only Anticoagulants, Misc. Hematological)   Medication Dose Route Last Admin   None          CHART  REVIEW & DISCUSSION     The following chart items were reviewed as of 5:54 PM on 07/17/22:  [x]  Lab Results [x]  Imaging Results   [x]  Problem  List  [x]  Current Orders [x]  Current Medications  [x]  Allergies  []  Code Status []  Previous Notes   []  SDoH    The management and plan of care for this patient was discussed with the following specialty consultants:  [x]  Cardiology  [x]  Gastroenterology                 []  Infectious Disease  []  Pulmonology [x]  Neurology                [x]  Nephrology  []  Neurosurgery []  Orthopedic Surgery  []   Heme/Onc  []  General Surgery []  Psychiatry                                   []  Palliative    SUBJECTIVE     [name] evaluated at the bedside lethargic but easily awake able for verbal stimuli  Unable to get review of systems  RN reported lethargy, other than that no change on neuroexam  CT head no change  MEDICATIONS     Current Facility-Administered Medications   Medication Dose Route Frequency    acetaminophen  650 mg Oral 4 times per day    albuterol-ipratropium  3 mL Nebulization Q6H WA    amiodarone  200 mg per NG tube Daily    aspirin  81 mg per NG tube Daily    darbepoetin alfa  0.45 mcg/kg Subcutaneous Weekly    glycopyrrolate  0.2 mg Intravenous Q8H    insulin glargine  7 Units Subcutaneous QAM    insulin lispro  2-10 Units Subcutaneous Q4H SCH    meropenem  500 mg Intravenous Q24H    metoprolol tartrate  12.5 mg per NG tube Q12H [facility]    micafungin  100 mg Intravenous Q24H    ondansetron  4 mg Intravenous Once    pantoprazole  40 mg Intravenous BID    thiamine  100 mg Intravenous Q24H SCH       PHYSICAL EXAM     Vitals:    07/17/22 1547   BP: 128/64   Pulse: 92   Resp: 18   Temp: 98.4 F (36.9 C)   SpO2: 100%       Temperature: Temp  Min: 97.9 F (36.6 C)  Max: 99.3 F (37.4 C)  Pulse: Pulse  Min: 79  Max: 100  Respiratory: Resp  Min: 15  Max: 18  Non-Invasive BP: BP  Min: 97/57  Max: 172/56  Pulse Oximetry SpO2  Min: 95 %  Max: 100 %    Intake and Output Summary (Last 24 hours) at Date Time    Intake/Output Summary (Last 24 hours) at 07/17/2022 1754  Last data filed at 07/17/2022 1341  Gross per 24 hour    Intake --   Output 2100 ml   Net -2100 ml         GEN APPEARANCE: Normal;  A&OX0  HEENT: PERLA; EOMI; Conjunctiva Clear  NECK: Supple; No bruits  CVS: RRR, S1, S2; No M/G/R  LUNGS: CTAB; wheezing improved, no Rhonchi: No rales  ABD: Soft; No TTP; + Normoactive BS  EXT: No edema; Pulses 2+ and intact  NEURO: moving right upper extremity spontaneously, able to move lower extremities for pain but minimal, no movement at all on the left upper extremity  MENTAL STATUS: Lethargic, oriented x 0, able to follow simple commands, unable to get detailed exam given mental status    LABS     Recent Labs   Lab 07/17/22  0431 07/16/22  0244 07/15/22  0427   WBC 15.03* 13.46* 11.90*   RBC 2.70* 2.66* 2.68*   Hemoglobin 7.9* 7.9* 8.1*   Hematocrit 24.8* 24.3* 24.6*   MCV 91.9 91.4 91.8   Platelet Count 286 242 236       Recent Labs   Lab 07/17/22  0431 07/16/22  0244 07/15/22  0427 07/14/22  0435 07/13/22  0348   Sodium 135 134* 135 138 138   Potassium 3.8 3.4* 3.7 3.4*  3.5   Chloride 98* 98* 98* 100 100   CO2 23 27 23 27 23    BUN 51* 32* 44* 24* 45*   Creatinine 3.7* 2.5* 3.6* 2.4* 4.1*   Glucose 164* 166* 145* 116* 182*   Calcium 8.6 8.2 8.2 8.4 8.0   Magnesium 1.7 1.7 1.8 1.8 1.8       Recent Labs   Lab 07/16/22  0244 07/14/22  0435   ALT 11 11   AST (SGOT) 15 18   Bilirubin, Total 0.2 0.2   Albumin 1.8* 1.8*   Alkaline Phosphatase 129* 85             Recent Labs   Lab 07/14/22  0435   INR 1.1   PT 12.4   PTT 28         Microbiology Results (last 15 days)       Procedure Component Value Units Date/Time    Culture, Blood, Aerobic And Anaerobic [[id number]] Collected: 07/07/22 0355    Order Status: Completed Specimen: Blood, Venous Updated: 07/12/22 0700     Culture Blood No growth at 5 days    Culture, Blood, Aerobic And Anaerobic [[id number]] Collected: 07/06/22 1837    Order Status: Completed Specimen: Blood, Venous Updated: 07/12/22 0000     Culture Blood No growth at 5 days    Culture, Sputum and Lower Respiratory [[id number]]      Order Status: Sent Specimen: Sputum, Induced     Culture, Sputum and Lower Respiratory [[id number]]  (Abnormal) Collected: 07/04/22 1034    Order Status: Completed Specimen: Sputum, Suctioned Updated: 07/12/22 1756     Culture Respiratory Heavy growth of Mixed upper respiratory flora      Light growth of Gram negative rod     Comment: Questionable significance due to low quantity.  No further work.        Gram Stain Few WBCs      Rare Squamous epithelial cells      Few Mixed respiratory flora    Fungal Identification, Molds [[id number]] Collected: 07/04/22 1034    Order Status: Sent Specimen: Sputum, Suctioned Updated: 07/14/22 1251             RADIOLOGY     XR Chest AP Portable    Result Date: 07/17/2022  1.Near-complete resolution of interstitial edema. 2.Improving pulmonary vascular congestion. Legrand Pitts, MD 07/17/2022 2:38 PM    CT Head WO Contrast    Result Date: 07/17/2022   1.No new acute intracranial pathology. 2.There are multiple regions of cytotoxic edema in the right > left frontal and left > right occipital lobes, consistent with known early subacute infarcts. Dannielle Burn 07/17/2022 8:26 AM    US Carotid Duplex Dopp Comp Bilateral    Result Date: 07/15/2022   1. Plaque formation in the right internal carotid artery bulb with elevated velocities. Stenosis is estimated at 50-69%. 2. Plaque in the left internal carotid artery with normal velocities. Any stenosis is less than 50%. Criteria for stenosis and velocity parameters are based on the guidelines of the SRU (Society of Radiologists in Ultrasound). Grant et al: Carotid Artery Stenosis: Grayscale and Doppler Ultrasound Diagnosis-- Society of Radiologists in Ultrasound Consensus Conference Ultrasound Quarterly. Volume 19, Number 4; December, 2003. Recommendations updated October 2021 Verne Carrow is an [facility] accredited facility Suszanne Finch, MD 07/15/2022 9:41 AM    Tunneled Cath Removal (Permcath)    Result Date: 07/14/2022  1. Nonfunctioning tunneled dialysis  catheter as described above. 2. Successful placement of  a new 19 cm Equistream catheter as described above. Dimitrios Papadouris, MD 07/14/2022 4:06 PM    Tunneled Cath Placement (Permcath)    Result Date: 07/14/2022  1. Nonfunctioning tunneled dialysis catheter as described above. 2. Successful placement of a new 19 cm Equistream catheter as described above. Dimitrios Papadouris, MD 07/14/2022 4:06 PM    MRI Brain WO Contrast    Result Date: 07/13/2022   1.Patchy areas of acute/recent infarction involving the cerebral hemispheres bilaterally. The infarcts have a configuration suggesting watershed territory infarcts. 2.Chronic ischemic changes as discussed. Georgann Housekeeper, MD 07/13/2022 10:28 PM    CT Head WO Contrast    Result Date: 07/13/2022   1.New region of loss of gray-white matter differentiation in the right frontal lobe, concerning for acute infarction. 2.A few additional areas of poor gray-white matter differentiation for example in the left occipital lobe may be artifactual but could be correlated with MRI. 3.No evidence of acute intracranial hemorrhage or significant mass effect. These urgent results were discussed with and acknowledged by Dr. Don Broach on 07/13/2022 3:20 PM. Vara Guardian, MD 07/13/2022 3:21 PM    XR Chest AP Portable    Result Date: 07/10/2022  Interstitial markings have slightly increased, suggesting minimal edema. No other change. Wilmon Pali, MD 07/10/2022 4:49 PM    XR Chest AP Portable    Result Date: 07/07/2022   There is mild interstitial prominence suggesting mild congestive heart failure/fluid overload in the setting of shortness of breath [name], MD 07/07/2022 12:32 PM    XR Chest AP Portable    Result Date: 07/06/2022   Hypoventilation with basilar atelectasis. Charlott Rakes, MD 07/06/2022 3:35 PM    CT Abdomen Pelvis W IV And PO Cont    Result Date: 07/06/2022  1.Postoperative changes from bilateral nephrectomy and cystectomy. 2.Small amount of ascites including some areas which appear  loculated. Small collections of fluid and gas in the anterior midline abdomen may be postoperative but superimposed infection is not excluded. No rim-enhancing fluid collections or extravasation of oral contrast to suggest anastomotic leak. Demetrios Isaacs, MD 07/06/2022 5:51 AM    CT Head WO Contrast    Result Date: 07/06/2022   1.No acute intracranial abnormality is seen. Leandro Reasoner, MD 07/06/2022 3:56 AM    XR Abdomen AP    Result Date: 07/05/2022  Enteric tube terminates in the distal stomach. Elease Etienne, DO 07/05/2022 12:24 PM    XR Chest AP Portable    Result Date: 06/30/2022  1. Lines and tubes in adequate position. No pneumothorax. 2. Trace left pleural effusion and bibasilar atelectasis. Jasmine December D'Heureux, MD 06/30/2022 4:53 PM    XR Chest AP Portable    Result Date: 06/30/2022  Catheters as described. Pankaj Dominica, MD 06/30/2022 2:07 PM    XR Chest AP Portable    Result Date: 06/28/2022   Right mainstem bronchial intubation. The tube should be withdrawn about 4 cm. Dr. Tildon Husky is aware. Wynema Birch, MD 06/28/2022 4:56 PM    XR OR MISCOUNT    Result Date: 06/28/2022   No unexpected foreign body. These critical results were discussed with and acknowledged by Angie R., the circulating nurse, who will communicate the findings to Everardo All, MD on 06/28/2022 3:02 PM. Bosie Helper, MD 06/28/2022 3:03 PM   Echo Results       None          No results found for this or any previous visit.    Signed,  Gayleen Orem, MD  5:54 PM 07/17/2022

## 2022-07-17 NOTE — Progress Notes (Signed)
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 s/p bilateral radical nephrectomies and  cystectomy on 06/28/22  - complicated by small bowel enterotomy and blood loss   New onset afib - on amio gtt + metoprolol  High grade urothelial bladder CA: s/p bilateral nephrectomies and cystectomy c/b mall bowel enterotomy and blood loss (~1L) on 06/28/22  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 (approx 1L) - remains low  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  Encephalopathy - persists  Leukocytosis - improving - on meropenem and micafungin  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   Hypophosphatemia - will hold binder     Recommendations:     HD today then Tuesday  Prolonged hospitalization with significant decline. Prognosis appears poor  Check iron  Start aranesp    Case discussed with: pt, HD RN    Threasa Alpha, MD  [facility]  [phone] (office)  X [phone] ([facility] Spectra-Link)      Subjective: awake, responds but remains confused       Review of Systems:   Unable to assess, patient is confused     Physical Exam:     Vitals:    07/17/22 0600 07/17/22 0810 07/17/22 0858 07/17/22 1000   BP:  128/55 97/57 120/57   Pulse:   89 79   Resp:  17  15   Temp:  99.1 F (37.3 C)  98 F (36.7 C)   TempSrc:  Axillary     SpO2:  99%  98%   Weight: 96.9 kg (213 lb 10 oz)      Height:           Intake and Output Summary (Last 24 hours) at Date Time  No intake or output data in the 24 hours ending 07/17/22  1030      General: confused, on NC, NGT in place; chronically ill appearing  Cardiovascular: regular rate and rhythm  Lungs: bilateral air entry  Abdomen: soft  Extremities: LUE edema  Access: R permacath   Meds:      Scheduled Meds: PRN Meds:    acetaminophen, 650 mg, Oral, 4 times per day  albuterol-ipratropium, 3 mL, Nebulization, Q6H WA  amiodarone, 200 mg, per NG tube, Daily  aspirin, 81 mg, per NG tube, Daily  darbepoetin alfa, 0.45 mcg/kg, Subcutaneous, Weekly  glycopyrrolate, 0.2 mg, Intravenous, Q8H  insulin glargine, 7 Units, Subcutaneous, QAM  insulin lispro, 2-10 Units, Subcutaneous, Q4H SCH  meropenem, 500 mg, Intravenous, Q24H  metoprolol tartrate, 12.5 mg, per NG tube, Q12H SCH  micafungin, 100 mg, Intravenous, Q24H  ondansetron, 4 mg, Intravenous, Once  pantoprazole, 40 mg, Intravenous, BID  thiamine, 100 mg, Intravenous, Q24H SCH          Continuous Infusions:       albuterol-ipratropium, 3 mL, Q6H PRN  benzocaine, 1 spray, TID PRN  dextrose, 15 g of glucose, PRN   Or  dextrose,  12.5 g, PRN   Or  dextrose, 12.5 g, PRN   Or  glucagon (rDNA), 1 mg, PRN  diphenhydrAMINE, 25 mg, Q6H PRN  diphenhydrAMINE, 25 mg, Once PRN  hydrALAZINE, 10 mg, Q3H PRN  HYDROmorphone, 1 mg, Q3H PRN  labetalol, 10 mg, Q15 Min PRN  lidocaine 2% jelly, , Once PRN  naloxone, 0.2 mg, PRN  oxyCODONE, 5 mg, Q4H PRN  phenol, 1 spray, Q2H PRN              Labs:     Recent Labs   Lab 07/17/22  0431 07/16/22  0244 07/15/22  0427   WBC 15.03* 13.46* 11.90*   Hemoglobin 7.9* 7.9* 8.1*   Hematocrit 24.8* 24.3* 24.6*   Platelet Count 286 242 236     Recent Labs   Lab 07/17/22  0431 07/16/22  0244 07/15/22  0427 07/14/22  0435   Sodium 135 134* 135 138   Potassium 3.8 3.4* 3.7 3.4*   Chloride 98* 98* 98* 100   CO2 23 27 23 27    BUN 51* 32* 44* 24*   Creatinine 3.7* 2.5* 3.6* 2.4*   Calcium 8.6 8.2 8.2 8.4   Albumin  --  1.8*  --  1.8*   Phosphorus 3.5 2.1* 3.4 2.9   Magnesium 1.7 1.7 1.8 1.8   Glucose 164* 166* 145* 116*   GFR 11.5* 18.4*  11.9* 19.3*           Signed by: Threasa Alpha, MD, FASN

## 2022-07-17 NOTE — Plan of Care (Signed)
Problem: Renal Instability  Goal: Fluid and electrolyte balance are achieved/maintained  Flowsheets (Taken 07/16/2022 2329 by Creig Hines, RN)  Fluid and electrolyte balance are achieved/maintained:   Monitor/assess lab values and report abnormal values   Assess and reassess fluid and electrolyte status     Problem: Patient Receiving Advanced Renal Therapies  Goal: Therapy access site remains intact  Flowsheets (Taken 07/15/2022 1154 by Winfield Rast, RN)  Therapy access site remains intact:   Assess therapy access site   Change therapy access site dressing as needed

## 2022-07-17 NOTE — Progress Notes (Signed)
Nurse notified me that patient is more lethargic  Went to the bedside evaluated her, opened her eyes to verbal stimuli and try to say some more dose  Not completely following commands a bit lethargic  Patient is due for dialysis today  Move her right upper extremity to painful stimuli  Left upper extremity is flaccid  Vital signs stable otherwise  Will get stat head CT  Continue closer monitoring  Plan discussed with RN at the bedside  Patient will get dialysis today as well.

## 2022-07-17 NOTE — Plan of Care (Signed)
NURSING SHIFT NOTE     Patient: Shelby Barron  Day: 78      SHIFT EVENTS     Shift Narrative/Significant Events (PRN med administration, fall, RRT, etc.):     Pt orientation is worsening, she responds to voice but very drowsy, responds to voice but responses are delayed. Her vss and aferbrile saturating in the mid 90 on room air. She continues to receive antibiotics, on continous tube feedings, bs checks q4 and nc and nih completed. She has bm overnight, was repositioned as q2. Wound vac in place and draining. Ct was done earlier in the day and was unchanged.     Safety and fall precautions remain in place. Purposeful rounding completed.          ASSESSMENT     Changes in assessment from patient's baseline this shift:    Neuro: stable  CV: No  Pulm: No  Peripheral Vascular: No  HEENT: No  GI:   BM during shift: No Last BM: Last BM Date: 07/17/22  GU: No  Integ:No  MS: No    Pain: no pain  Pain Interventions: no pain meds  Medications Utilized: no pain meds given    Mobility: PMP Activity: Step 3 - Bed Mobility of Distance Walked (ft) (Step 6,7): 0 Feet           Lines     Patient Lines/Drains/Airways Status       Active Lines, Drains and Airways       Name Placement date Placement time Site Days    Permacath Catheter - Tunneled 07/14/22 Subclavian vein catheter Right 07/14/22  1404  -- 3    Peripheral IV 07/15/22 20 G Standard Posterior;Right Hand 07/15/22  0419  Hand  2    Midline IV 07/01/22 Anterior;Left Upper Arm 07/01/22  1315  Upper Arm  16    Feeding Tube ND 12 Fr. Right nare 07/05/22  1130  Right nare  12                         VITAL SIGNS     Vitals:    07/17/22 2049   BP: 127/65   Pulse: 95   Resp:    Temp:    SpO2:        Temp  Min: 98 F (36.7 C)  Max: 99.3 F (37.4 C)  Pulse  Min: 75  Max: 100  Resp  Min: 15  Max: 23  BP  Min: 97/57  Max: 172/56  SpO2  Min: 92 %  Max: 100 %      Intake/Output Summary (Last 24 hours) at 07/17/2022 2159  Last data filed at 07/17/2022 1341  Gross per 24 hour   Intake  --   Output 2100 ml   Net -2100 ml            CARE PLAN         Problem: Moderate/High Fall Risk Score >5  Goal: Patient will remain free of falls  Outcome: Progressing  Flowsheets (Taken 07/17/2022 2155)  High (Greater than 13):   HIGH-Consider use of low bed   HIGH-Pharmacy to initiate evaluation and intervention per protocol   HIGH-Initiate use of floor mats as appropriate   HIGH-Apply yellow "Fall Risk" arm band     Problem: Compromised Friction/Shear  Goal: Friction and Shear Interventions  Outcome: Progressing  Flowsheets (Taken 07/17/2022 2155)  Friction and Shear Interventions: Pad bony prominences, Off load heels, HOB  30 degrees or less unless contraindicated, Consider: TAP seated positioning, Heel foams     Problem: Inadequate Airway Clearance  Goal: Patent Airway maintained  Outcome: Progressing  Flowsheets (Taken 07/17/2022 2155)  Patent airway maintained:   Position patient for maximum ventilatory efficiency   Provide adequate fluid intake to liquefy secretions   Suction secretions as needed     Problem: Infection Prevention  Goal: Free from infection  Outcome: Progressing  Flowsheets (Taken 07/17/2022 2155)  Free from infection:   Monitor/assess vital signs   Encourage/assist patient to turn, cough and perform deep breathing every 2 hours     Problem: Impaired Mobility  Goal: Mobility/Activity is maintained at optimal level for patient  Outcome: Progressing  Flowsheets (Taken 07/17/2022 2155)  Mobility/activity is maintained at optimal level for patient: Encourage independent activity per ability     Problem: Constipation  Goal: Elimination patterns are normal or improving  Outcome: Progressing     Problem: Safety  Goal: Patient will be free from injury during hospitalization  Outcome: Progressing  Flowsheets (Taken 07/17/2022 2155)  Patient will be free from injury during hospitalization:   Assess patient's risk for falls and implement fall prevention plan of care per policy   Ensure appropriate safety  devices are available at the bedside   Hourly rounding   Use appropriate transfer methods  Goal: Patient will be free from infection during hospitalization  Outcome: Progressing  Flowsheets (Taken 07/17/2022 2155)  Free from Infection during hospitalization:   Assess and monitor for signs and symptoms of infection   Monitor lab/diagnostic results     Problem: Side Effects from Pain Analgesia  Goal: Patient will experience minimal side effects of analgesic therapy  Outcome: Progressing  Flowsheets (Taken 07/17/2022 2155)  Patient will experience minimal side effects of analgesic therapy:   Monitor/assess patient's respiratory status (RR depth, effort, breath sounds)   Assess for changes in cognitive function     Problem: Hemodynamic Status: Cardiac  Goal: Stable vital signs and fluid balance  Outcome: Progressing  Flowsheets (Taken 07/17/2022 2155)  Stable vital signs and fluid balance: Assess signs and symptoms associated with cardiac rhythm changes     Problem: Renal Instability  Goal: Fluid and electrolyte balance are achieved/maintained  Outcome: Progressing  Flowsheets (Taken 07/17/2022 2155)  Fluid and electrolyte balance are achieved/maintained:   Monitor/assess lab values and report abnormal values   Assess and reassess fluid and electrolyte status     Problem: Inadequate Gas Exchange  Goal: Patent Airway maintained  Outcome: Progressing  Flowsheets (Taken 07/17/2022 2155)  Patent airway maintained:   Position patient for maximum ventilatory efficiency   Provide adequate fluid intake to liquefy secretions   Suction secretions as needed     Problem: Neurological Deficit  Goal: Neurological status is stable or improving  Outcome: Progressing  Flowsheets (Taken 07/17/2022 2155)  Neurological status is stable or improving:   Monitor/assess/document neurological assessment (Stroke: every 4 hours)   Monitor/assess NIH Stroke Scale   Re-assess NIH Stroke Scale for any change in status     Problem: Potential for  Aspiration  Goal: Risk of aspiration will be minimized  Outcome: Progressing     Problem: Every Day - Stroke  Goal: Mobility/Activity is maintained at optimal level for patient  Outcome: Progressing  Flowsheets (Taken 07/17/2022 2155)  Mobility/activity is maintained at optimal level for patient: Encourage independent activity per ability  Goal: Elimination patterns are normal or improving  Outcome: Progressing  Goal: Neurological status is stable or improving  Outcome: Progressing  Flowsheets (Taken 07/17/2022 2155)  Neurological status is stable or improving:   Monitor/assess/document neurological assessment (Stroke: every 4 hours)   Monitor/assess NIH Stroke Scale   Re-assess NIH Stroke Scale for any change in status  Goal: Core/Quality measure requirements - Daily  Outcome: Progressing  Flowsheets (Taken 07/17/2022 2155)  Core/Quality measure requirements - Daily: VTE Prevention: Ensure anticoagulant(s) administered and/or anti-embolism stockings/devices documented by end of day 2  Goal: Stable vital signs and fluid balance  Outcome: Progressing  Flowsheets (Taken 07/17/2022 2155)  Stable vital signs and fluid balance:   Position patient for maximum circulation/cardiac output   Monitor and assess vitals every 4 hours or as ordered and hemodynamic parameters  Goal: Patient will maintain adequate oxygenation  Outcome: Progressing  Flowsheets (Taken 07/17/2022 2155)  Patient will maintain adequate oxygenation:   Suction secretions as needed   Maintain SpO2 of greater than 92%

## 2022-07-17 NOTE — Progress Notes (Signed)
Hd tx started via R CVC, working well, wound vac noted, VS WDL as of now, nasal cannula 2L sats 100%, pt open eyes when named called, Report received from [facility] RN.   07/17/22 1000   Bedside Nurse Communication   Name of bedside RN - pre dialysis Lenn Sink RN   Treatment Initiation- With Dialysis Precautions   Time Out/Safety Check Completed Yes   Consent for HD signed for this hospitalization (Date) 06/01/22   Consent for HD signed for this hospitalization (Time) 1516   Preferred language No   Blood Consent Verified N/A   Dialysis Precautions All Connections Secured   Dialysis Treatment Type Routine   Is patient diabetic? Yes   RO/Hemodialysis Cabin crew   Is Total Chlorine less than 0.1 ppm? Yes   Orignial Total Chlorine Testing Time 1000   At 4 Hour Total Chlorine Testing Time 1400   RO/Hemodialysis Arts development officer Number 6    Machine Serial Number [id number]   RO # 27   RO Serial # [id number]   Water Hardness NA   pH 7.2   Pressure Test Verified Yes   Alarms Verified Passed   Machine Temperature 97.7 F (36.5 C)   Alarms Verified Yes   Na+ mEq (Machine) 138 mEq   Bicarb mEq (Machine) 35 mEq   Hemodialysis Conductivity (Machine) 13.6   Hemodialysis Conductivity (Meter) 13.8   Dialyzer Lot Number [id number]   Tubing Lot Number [id number]   RO Machine Log Completed Yes   Hepatitis Status   HBsAg (Antigen) Result Negative   HBsAg Date Drawn 07/03/22   HBsAg Repeat Draw Due Date 07/31/22   Dialysis Weight   Pre-Treatment Weight (Kg) 96.9   Scale Type ICU Bed Scale   Vitals   Temp 98 F (36.7 C)   Heart Rate 79   Resp Rate 15   BP 120/57   SpO2 98 %   O2 Device Nasal cannula   Assessment   Mental Status Does not follow commands  (open eyes when name called)   Cardiac (WDL) X   Cardiac Regularity Irregular   Cardiac Symptoms Return to [facility]   Cardiac Rhythm Normal sinus rhythm   Respiratory  WDL   Bilateral Breath Sounds Diminished   R Breath Sounds Diminished   L Breath Sounds Diminished   Cough  Spontaneous   Edema  WDL   Generalized Edema +1   RLE Edema +1   LLE Edema +1   General Skin Color Appropriate for ethnicity   Skin Condition/Temp Cool   Gastrointestinal (WDL) X   Abdomen Inspection Rounded   GI Conditions   (wound vac)   Mobility Bed   Permacath Catheter - Tunneled 07/14/22 Subclavian vein catheter Right   Placement Date/Time: 07/14/22 1404   Inserted by: Dr. Welton Flakes  Access Type: Subclavian vein catheter  Orientation: Right  Central Line Infection Prevention Education provided?: Yes  Hand Hygiene: Alcohol based hand scrub;Soap and water  Line cart u...   Line necessity reviewed? Apheresis/hemodialysis   Site Assessment Clean;Dry;Intact   Catheter Lumen Volume Venous 1.6 mL   Catheter Lumen Volume Arterial 1.6 mL   Dressing Status and Intervention Dressing Intact;Clean & Dry   Tego/Curos Caps on Catheter Yes   NEW Tego/Curos Caps placed (Date) 07/16/22   APHERESIS ONLY:  Access  Red   APHERESIS ONLY: Return Blue   End Caps Free From Blood Yes   Line Care Connections checked and tightened   Dressing Type  Transparent;Biopatch   Dressing Status Clean;Dry;Intact   Dressing/Line Intervention Caps changed   Pain Assessment   Charting Type Assessment   Pain Scale Used Numeric Scale (0-10)   Numeric Pain Scale   Pain Score 0   POSS Score 1   Multiple Pain Sites No   Hemodialysis Comments   Pre-Hemodialysis Comments timeout & safety checks done

## 2022-07-17 NOTE — Plan of Care (Signed)
Problem: Impaired Mobility  Goal: Mobility/Activity is maintained at optimal level for patient  07/17/2022 1903 by Lenn Sink, RN  Outcome: Progressing  Flowsheets (Taken 07/16/2022 2329 by Creig Hines, RN)  Mobility/activity is maintained at optimal level for patient:   Encourage independent activity per ability   Consult/collaborate with Physical Therapy and/or Occupational Therapy  07/17/2022 1903 by Lenn Sink, RN  Outcome: Progressing     Problem: Constipation  Goal: Elimination patterns are normal or improving  07/17/2022 1903 by Lenn Sink, RN  Outcome: Progressing  Flowsheets (Taken 07/15/2022 0342 by Hannesschlager, Angelique Blonder, RN)  Elimination patterns are normal or improving: Assess for and discuss C. diff screening with LIP  07/17/2022 1903 by Lenn Sink, RN  Outcome: Progressing     Problem: Hemodynamic Status: Cardiac  Goal: Stable vital signs and fluid balance  07/17/2022 1903 by Lenn Sink, RN  Outcome: Progressing  Flowsheets (Taken 07/16/2022 2329 by Creig Hines, RN)  Stable vital signs and fluid balance: Assess signs and symptoms associated with cardiac rhythm changes  07/17/2022 1903 by Lenn Sink, RN  Outcome: Progressing     Problem: Neurological Deficit  Goal: Neurological status is stable or improving  Outcome: Progressing     Problem: Every Day - Stroke  Goal: Mobility/Activity is maintained at optimal level for patient  07/17/2022 1903 by Lenn Sink, RN  Outcome: Progressing  Flowsheets (Taken 07/16/2022 2329 by Creig Hines, RN)  Mobility/activity is maintained at optimal level for patient:   Encourage independent activity per ability   Consult/collaborate with Physical Therapy and/or Occupational Therapy  07/17/2022 1903 by Lenn Sink, RN  Outcome: Progressing  Goal: Elimination patterns are normal or improving  07/17/2022 1903 by Lenn Sink, RN  Outcome: Progressing  Flowsheets (Taken 07/15/2022 0342 by Hannesschlager, Angelique Blonder, RN)  Elimination patterns are normal or improving: Assess for and  discuss C. diff screening with LIP  07/17/2022 1903 by Lenn Sink, RN  Outcome: Progressing  Goal: Neurological status is stable or improving  Outcome: Progressing     NURSING SHIFT NOTE     Patient: Shelby Barron  Day: 19      SHIFT EVENTS     Shift Narrative/Significant Events (PRN med administration, fall, RRT, etc.):     Delayed responses, slurred speech, requires frequent stimulation to follow commands, MD aware see new orders. VSS, labored patterned breathing, moments of rapid and slow breathing with apnea noted. Family visiting and updated.     Safety and fall precautions remain in place. Purposeful rounding completed.          ASSESSMENT     Changes in assessment from patient's baseline this shift:    Neuro: No  CV: No  Pulm: No  Peripheral Vascular: No  HEENT: No  GI: No  BM during shift: Yes   , Last BM: Last BM Date: 07/17/22  GU: No   Integ: No  MS: No    Pain: None  Pain Interventions: N/A  Medications Utilized: N/A    Mobility: PMP Activity: Step 3 - Bed Mobility of Distance Walked (ft) (Step 6,7): 0 Feet           Lines     Patient Lines/Drains/Airways Status       Active Lines, Drains and Airways       Name Placement date Placement time Site Days    Permacath Catheter - Tunneled 07/14/22 Subclavian vein catheter Right 07/14/22  1404  -- 3    Peripheral IV 07/15/22 20 G Standard Posterior;Right  Hand 07/15/22  0419  Hand  2    Midline IV 07/01/22 Anterior;Left Upper Arm 07/01/22  1315  Upper Arm  16    Feeding Tube ND 12 Fr. Right nare 07/05/22  1130  Right nare  12                         VITAL SIGNS     Vitals:    07/17/22 1547   BP: 128/64   Pulse: 92   Resp: 18   Temp: 98.4 F (36.9 C)   SpO2: 100%       Temp  Min: 97.9 F (36.6 C)  Max: 99.3 F (37.4 C)  Pulse  Min: 79  Max: 100  Resp  Min: 15  Max: 18  BP  Min: 97/57  Max: 172/56  SpO2  Min: 95 %  Max: 100 %      Intake/Output Summary (Last 24 hours) at 07/17/2022 1904  Last data filed at 07/17/2022 1341  Gross per 24 hour   Intake --   Output  2100 ml   Net -2100 ml

## 2022-07-17 NOTE — Progress Notes (Signed)
HD tx completed x3.5hrs, 2.1L fluid removed, tolerated well, VS WDL, 2L nasal cannula sats 100%, pt eyes open, occational will respond to name called, Report given to [employer].  Patient Vitals for the past 4 hrs:   BP Temp Pulse Resp   07/17/22 1341 123/55 99.2 F (37.3 C) 91 18   07/17/22 1330 99/51 -- 95 --   07/17/22 1315 107/54 -- 94 --   07/17/22 1300 110/61 -- 93 --   07/17/22 1245 114/56 -- 95 --   07/17/22 1230 106/64 -- 95 --   07/17/22 1215 124/59 -- 94 --   07/17/22 1200 113/66 -- 96 --   07/17/22 1145 149/65 -- 94 --   07/17/22 1130 131/79 -- 88 --   07/17/22 1115 128/85 -- 87 --   07/17/22 1100 128/71 -- 86 --   07/17/22 1045 131/65 -- 82 --   07/17/22 1030 166/79 -- 83 --   07/17/22 1015 172/56 -- 79 --   07/17/22 1000 120/57 98 F (36.7 C) 79 15        07/17/22 1341   Treatment Summary   Time Off Machine 1340   Duration of Treatment (Hours) 3.5   Treatment Type 2:1   Dialyzer Clearance Moderately streaked   Fluid Volume Off (mL) 2500   Prime Volume (mL) 200   Rinseback Volume (mL) 200   Fluid Given: Normal Saline (mL) 0   Fluid Given: PRBC  0 mL   Fluid Given: Albumin (mL) 0   Fluid Given: Other (mL) 0   Total Fluid Given 400   Hemodialysis Net Fluid Removed 2100   Post Treatment Assessment   Post-Treatment Weight (Kg) -2.1   Patient Response to Treatment tolerated well   Additional Dialyzer Used 0   Permacath Catheter - Tunneled 07/14/22 Subclavian vein catheter Right   Placement Date/Time: 07/14/22 1404   Inserted by: Dr. Welton Flakes  Access Type: Subclavian vein catheter  Orientation: Right  Central Line Infection Prevention Education provided?: Yes  Hand Hygiene: Alcohol based hand scrub;Soap and water  Line cart u...   Line necessity reviewed? Apheresis/hemodialysis   Site Assessment Clean;Dry;Intact   Catheter Lumen Volume Venous 1.6 mL   Catheter Lumen Volume Arterial 1.6 mL   Dressing Status and Intervention Dressing Intact;Clean & Dry   Tego/Curos Caps on Catheter Yes   NEW Tego/Curos Caps  placed (Date) 07/17/22   APHERESIS ONLY:  Access  Red   APHERESIS ONLY: Return Blue   End Caps Free From Blood Yes   Line Care Connections checked and tightened   Dressing Type Transparent;Biopatch   Dressing Status Clean;Dry;Intact   Dressing/Line Intervention Caps changed   Vitals   Temp 99.2 F (37.3 C)   Heart Rate 91   Resp Rate 18   BP 123/55   SpO2 100 %   O2 Device Nasal cannula   Assessment   Mental Status Does not follow commands   Cardiac (WDL) X   Cardiac Regularity Irregular   Cardiac Symptoms Return to [facility]   Cardiac Rhythm Normal sinus rhythm   Respiratory  WDL   Bilateral Breath Sounds Diminished   R Breath Sounds Diminished   L Breath Sounds Diminished   Cough Spontaneous   Edema  WDL   Generalized Edema +1   RLE Edema +1   LLE Edema +1   General Skin Color Appropriate for ethnicity   Skin Condition/Temp Cool   Gastrointestinal (WDL) X   Abdomen Inspection Rounded   GI Conditions   (  wound vac)   Mobility Bed   Pain Assessment   Charting Type Reassessment   Pain Scale Used Numeric Scale (0-10)   Numeric Pain Scale   Pain Score 0   POSS Score 1   Multiple Pain Sites No   Education   Person taught Patient   Knowledge basis Substantial   Topics taught Procedure   Teaching Tools Explain   Reponse Unable to Teach Back (see Comments)  (doesnt follow command)   Bedside Nurse Communication   Name of bedside RN - pre dialysis Lenn Sink RN

## 2022-07-17 NOTE — SLP Progress Note (Signed)
[facility]    Speech Therapy Treatment Note Attempt   Patient: Shelby Barron MRN#: [id number]   Unit: [facility] Room/Bed: N8295/A2130-Q       Attempted to see patient at approximately 14:22pm.         SLP Order Cancellation Reason: Not clinically indicated at this time (comment required) - Rehab decision Per RN, pt is not following commands.     Will continue to monitor for appropriateness. Will attempt again on 07/18/22      07/17/2022   Claudie Revering., CCC-SLP  [id number] Language Pathology

## 2022-07-18 ENCOUNTER — Inpatient Hospital Stay: Payer: No Typology Code available for payment source

## 2022-07-18 DIAGNOSIS — R4182 Altered mental status, unspecified: Secondary | ICD-10-CM

## 2022-07-18 DIAGNOSIS — D72829 Elevated white blood cell count, unspecified: Secondary | ICD-10-CM

## 2022-07-18 LAB — LAB USE ONLY - CBC WITH DIFFERENTIAL
Absolute Basophils: 0.07 10*3/uL (ref 0.00–0.08)
Absolute Eosinophils: 0.07 10*3/uL (ref 0.00–0.44)
Absolute Immature Granulocytes: 0.25 10*3/uL — ABNORMAL HIGH (ref 0.00–0.07)
Absolute Lymphocytes: 1.41 10*3/uL (ref 0.42–3.22)
Absolute Monocytes: 1.32 10*3/uL — ABNORMAL HIGH (ref 0.21–0.85)
Absolute Neutrophils: 11.03 10*3/uL — ABNORMAL HIGH (ref 1.10–6.33)
Absolute nRBC: 0 10*3/uL (ref <=0.00)
Basophils %: 0.5 %
Eosinophils %: 0.5 %
Hematocrit: 24.5 % — ABNORMAL LOW (ref 34.7–43.7)
Hemoglobin: 7.8 g/dL — ABNORMAL LOW (ref 11.4–14.8)
Immature Granulocytes %: 1.8 %
Lymphocytes %: 10 %
MCH: 29.3 pg (ref 25.1–33.5)
MCHC: 31.8 g/dL (ref 31.5–35.8)
MCV: 92.1 fL (ref 78.0–96.0)
MPV: 10.9 fL (ref 8.9–12.5)
Monocytes %: 9.3 %
Neutrophils %: 77.9 %
Platelet Count: 323 10*3/uL (ref 142–346)
Preliminary Absolute Neutrophil Count: 11.03 10*3/uL — ABNORMAL HIGH (ref 1.10–6.33)
RBC: 2.66 10*6/uL — ABNORMAL LOW (ref 3.90–5.10)
RDW: 15 % (ref 11–15)
WBC: 14.15 10*3/uL — ABNORMAL HIGH (ref 3.10–9.50)
nRBC %: 0 /100 WBC (ref <=0.0)

## 2022-07-18 LAB — WHOLE BLOOD GLUCOSE POCT
Whole Blood Glucose POCT: 146 mg/dL — ABNORMAL HIGH (ref 70–100)
Whole Blood Glucose POCT: 153 mg/dL — ABNORMAL HIGH (ref 70–100)
Whole Blood Glucose POCT: 166 mg/dL — ABNORMAL HIGH (ref 70–100)
Whole Blood Glucose POCT: 175 mg/dL — ABNORMAL HIGH (ref 70–100)
Whole Blood Glucose POCT: 177 mg/dL — ABNORMAL HIGH (ref 70–100)
Whole Blood Glucose POCT: 183 mg/dL — ABNORMAL HIGH (ref 70–100)

## 2022-07-18 LAB — ARTERIAL BLOOD GAS
Arterial Base Excess: 2.8 mEq/L — ABNORMAL HIGH (ref <=2.7)
Arterial CO2: 24 mEq/L (ref 23.0–30.0)
Arterial HCO3: 25.5 mEq/L (ref 23.0–28.0)
Arterial O2 Saturation: 97.1 % (ref 94.0–98.0)
Arterial pCO2: 32.8 mmHg (ref 32.0–48.0)
Arterial pH: 7.503 — ABNORMAL HIGH (ref 7.350–7.450)
Arterial pO2: 84.2 mmHg (ref 83.0–108.0)
FIO2: 28 %
O2 Liter Flow: 2 L/min
Temperature: 37 C

## 2022-07-18 LAB — COMPREHENSIVE METABOLIC PANEL
ALT: 12 U/L (ref 0–55)
AST (SGOT): 18 U/L (ref 5–41)
Albumin/Globulin Ratio: 0.6 — ABNORMAL LOW (ref 0.9–2.2)
Albumin: 1.8 g/dL — ABNORMAL LOW (ref 3.5–5.0)
Alkaline Phosphatase: 122 U/L — ABNORMAL HIGH (ref 37–117)
Anion Gap: 13 (ref 5.0–15.0)
BUN: 31 mg/dL — ABNORMAL HIGH (ref 7–21)
Bilirubin, Total: 0.2 mg/dL (ref 0.2–1.2)
CO2: 25 mEq/L (ref 17–29)
Calcium: 8.9 mg/dL (ref 7.9–10.2)
Chloride: 101 mEq/L (ref 99–111)
Creatinine: 2.8 mg/dL — ABNORMAL HIGH (ref 0.4–1.0)
GFR: 16.1 mL/min/{1.73_m2} — ABNORMAL LOW (ref >=60.0)
Globulin: 3.1 g/dL (ref 2.0–3.6)
Glucose: 185 mg/dL — ABNORMAL HIGH (ref 70–100)
Potassium: 3.7 mEq/L (ref 3.5–5.3)
Protein, Total: 4.9 g/dL — ABNORMAL LOW (ref 6.0–8.3)
Sodium: 139 mEq/L (ref 135–145)

## 2022-07-18 LAB — IRON PROFILE
Iron Saturation: 18 % (ref 15–50)
Iron: 18 ug/dL — ABNORMAL LOW (ref 32–157)
TIBC: 99 ug/dL — ABNORMAL LOW (ref 265–497)
UIBC: 81 ug/dL — ABNORMAL LOW (ref 126–382)

## 2022-07-18 LAB — FERRITIN: Ferritin: 2300.21 ng/mL — ABNORMAL HIGH (ref 4.60–204.00)

## 2022-07-18 LAB — PHOSPHORUS: Phosphorus: 2.4 mg/dL (ref 2.3–4.7)

## 2022-07-18 LAB — MAGNESIUM: Magnesium: 1.8 mg/dL (ref 1.6–2.6)

## 2022-07-18 MED ORDER — DIPHENHYDRAMINE HCL 25 MG PO CAPS
50.0000 mg | ORAL_CAPSULE | Freq: Once | ORAL | Status: DC
Start: 2022-07-18 — End: 2022-07-18

## 2022-07-18 MED ORDER — VANCOMYCIN HCL IN NACL 1.25-0.9 GM/250ML-% IV SOLN
1250.0000 mg | Freq: Once | INTRAVENOUS | Status: AC
Start: 2022-07-18 — End: 2022-07-18
  Administered 2022-07-18: 1250 mg via INTRAVENOUS
  Filled 2022-07-18: qty 250

## 2022-07-18 MED ORDER — DIPHENHYDRAMINE HCL 25 MG PO CAPS
50.0000 mg | ORAL_CAPSULE | Freq: Once | ORAL | Status: AC
Start: 2022-07-19 — End: 2022-07-19
  Administered 2022-07-19: 50 mg via ORAL
  Filled 2022-07-18: qty 1
  Filled 2022-07-18: qty 2

## 2022-07-18 MED ORDER — HYDROCORTISONE SOD SUC (PF) 100 MG IJ SOLR (WRAP)
200.0000 mg | INTRAMUSCULAR | Status: AC
Start: 2022-07-18 — End: 2022-07-19
  Administered 2022-07-18 – 2022-07-19 (×4): 200 mg via INTRAVENOUS
  Filled 2022-07-18 (×4): qty 4

## 2022-07-18 MED ORDER — MIDAZOLAM HCL 1 MG/ML IJ SOLN (WRAP)
1.0000 mg | Freq: Once | INTRAMUSCULAR | Status: DC
Start: 2022-07-18 — End: 2022-07-18

## 2022-07-18 MED ORDER — HEPARIN SODIUM (PORCINE) 5000 UNIT/ML IJ SOLN
5000.0000 [IU] | Freq: Two times a day (BID) | INTRAMUSCULAR | Status: DC
Start: 2022-07-18 — End: 2022-07-23
  Administered 2022-07-18 – 2022-07-23 (×10): 5000 [IU] via SUBCUTANEOUS
  Filled 2022-07-18 (×10): qty 1

## 2022-07-18 MED ORDER — DIPHENHYDRAMINE HCL 25 MG PO CAPS
50.0000 mg | ORAL_CAPSULE | Freq: Once | ORAL | Status: AC
Start: 2022-07-18 — End: 2022-07-18
  Administered 2022-07-18: 50 mg via ORAL
  Filled 2022-07-18: qty 2

## 2022-07-18 NOTE — Progress Notes (Incomplete)
NEURO:  Not Following commands  Q4hr Neuro checks;   NIH=  24  PERRLA  Po(2 bottles) contrast given per CT  Ct abdomin and pelvics completed    CV:  pulses palpable;   ZO:XWRU; on monitor.   EAV:WUJW 120-160, Hydralazine given x 1, Labetalol given x 3, provider(Kajsa notified)  Generalized +1 pitting edema.    SCDs intact    Respiratory:   Room air    GI:  Diets: NPO   LBM-3bm    GU:  UO=Anuric     SKIN:   Skin/ incontinence care rendered.   Mepilex/Adhesive Foam Dressing applied to sacrum/BL heels    Repositioned at intervals. Kept clean, dry and comfortable.     Safety maintained. Bed placed in the lowest position with call bell in reach, bed alarm on, and hourly rounding completed.

## 2022-07-18 NOTE — Progress Notes (Addendum)
[facility] Consultation Note                                       Date Time: 07/18/22 9:16 AM  Patient Name: Shelby Barron, Shelby Barron  Requesting Physician: Gayleen Orem, MD  Date of Admission: 06/28/2022    CC / Reason for Consultation: LUE weakness           Assessment:     82y/o woman hx of COPD, CVA, DM, renal cell carcinoma s/p bilateral nephrectomy, [name], new diagnosis of urothelial cancer admitted for elective cystectomy and completion nephrectomy. [name]. Hospital course complicated by new onset a fib with RVR. Neurology consulted for LUE weakness and change in speech.     MRI brain with patchy areas of acute infarct involving bilateral cerebral hemispheres. Pattern suggestive of watershed infarcts.     Carotid ultrasound with 50-69% stenosis of R ICA, <50% L ICA.     Infarcts likely in setting of drop in blood pressure based on distribution of infarcts on imaging. Symptoms reportedly first noticed after patient returned from OR.     Now with waxing and waning mental status, noted to be less responsive this morning. Follow up head CT on 06/29 with no acute process. Decline in mental status potentially related to toxic-metabolic encephalopathy vs hospital delirium.     -TTE 06/07 EF 65%,       Plan:     -ASA 81mg  daily cleared by GI. Long term would likely benefit from anticoagulation in setting of new onset A fib but would favor waiting 10 days from onset due to size of infarcts and potential for hemorrhagic conversion  -daily statin  -SBP goal 120-160   -EEG  -Will check MRI brain with noted change in clinical status and apparent right gaze deviation  -Metabolic and infectious workup per primary team. Suggest checking ABG  -PT/OT evaluation    Past Medical Hx     Past Medical History:   Diagnosis Date    Acid reflux     Asthma, well controlled     Cancer of kidney     Chronic lower back pain     Congestive heart disease      COPD (chronic obstructive pulmonary disease)     CVA (cerebral infarction) 01/12/2003    Diabetes     Diabetic nephropathy     Fibroids     Gout     H/O: gout     Hyperlipidemia     Hypertension     Neuropathy of hand     SOB (shortness of breath)           Past Surgical Hx:     Past Surgical History:   Procedure Laterality Date    APPENDECTOMY (OPEN)      CLOSURE, ENTEROTOMY, SMALL INTESTINE  06/28/2022    Procedure: CLOSURE, ENTEROTOMY, SMALL INTESTINE;  Surgeon: Marlaine Hind, MD;  Location: [facility];  Service: General;;    CYSTECTOMY, ILEOCONDUIT N/A 06/28/2022    Procedure: CYSTECTOMY, RADICAL;  Surgeon: Neldon Newport, MD;  Location: [facility];  Service: Urology;  Laterality: N/A;    DRAIN (OTHER) N/A 05/31/2022    Procedure: DRAIN (OTHER);  Surgeon: Hope Pigeon, MD;  Location: [facility];  Service: Interventional Radiology;  Laterality: N/A;    EXPLORATORY LAPAROTOMY N/A 06/30/2022  Procedure: SMALL BOWEL ANASTOMOSIS, CLOSURE OF MESENTERIC DEFECT, CLOSURE OF ABDOMINAL WALL;  Surgeon: Marlaine Hind, MD;  Location: [facility];  Service: General;  Laterality: N/A;  **[id number]/ MD AVAIL [id number] OR AFTER 1600**    EXPLORATORY LAPAROTOMY, RESECTION SMALL BOWEL N/A 06/28/2022    Procedure: RESECTION SMALL BOWEL;  Surgeon: Marlaine Hind, MD;  Location: [facility];  Service: General;  Laterality: N/A;    HERNIA REPAIR      HYSTERECTOMY      LAPAROTOMY, NEPHRECTOMY Bilateral 06/28/2022    Procedure: BILATERAL OPEN RADICAL NEPHRECTOMY AND LYSIS OF ASHESION;  Surgeon: Neldon Newport, MD;  Location: [facility];  Service: Urology;  Laterality: Bilateral;    PARTIAL NEPHRECTOMY      tumor removed from kidney  cJanuary 2015    TUNNELED CATH PLACEMENT (PERMCATH) N/A 06/01/2022    Procedure: TUNNELED CATH PLACEMENT;  Surgeon: Suszanne Finch, MD;  Location: [facility];  Service: Interventional Radiology;  Laterality: N/A;    TUNNELED CATH PLACEMENT (PERMCATH) Right 07/14/2022    Procedure: TUNNELED  CATH PLACEMENT;  Surgeon: Hope Pigeon, MD;  [facility];  Service: Interventional Radiology;  Laterality: Right;  right IJ tunneled HD catheter placement    TUNNELED CATH REMOVAL (PERMCATH) Right 07/14/2022    Procedure: TUNNELED CATH REMOVAL;  Surgeon: Hope Pigeon, MD;  Location: [facility];  Service: Interventional Radiology;  Laterality: Right;  right neck/chest tunneled HD catheter removal    TURBT      WOUND VAC APPLICATION N/A 06/28/2022    Procedure: Tawni Pummel WOUND VAC APPLICATION;  Surgeon: Marlaine Hind, MD;  Location: [facility];  Service: General;  Laterality: N/A;    WOUND VAC APPLICATION N/A 06/30/2022    Procedure: WOUND VAC APPLICATION;  Surgeon: Marlaine Hind, MD;  Location: [facility];  Service: General;  Laterality: N/A;        Family Medical History:      Family History   Problem Relation Age of Onset    Cancer Mother     Heart failure Father     Cancer Maternal Aunt     Diabetes Maternal Aunt     Cancer Paternal Aunt        Social Hx     Social History     Socioeconomic History    Marital status: Married   Tobacco Use    Smoking status: Former    Smokeless tobacco: Never   Advertising account planner    Vaping status: Never Used   Substance and Sexual Activity    Alcohol use: No    Drug use: No     Social Determinants of Health     Food Insecurity: No Food Insecurity (05/26/2022)    Hunger Vital Sign     Worried About Running Out of Food in the Last Year: Never true     Ran Out of Food in the Last Year: Never true   Transportation Needs: No Transportation Needs (05/26/2022)    PRAPARE - Therapist, art (Medical): No     Lack of Transportation (Non-Medical): No    [facility]    Social Network   Intimate Partner Violence: Not At Risk (05/26/2022)    Humiliation, Afraid, Rape, and Kick questionnaire     Fear of Current or Ex-Partner: No     Emotionally Abused: No     Physically Abused: No     Sexually Abused: No   Housing Stability: Unknown (05/26/2022)  Housing Stability Vital Sign     Unable to Pay for Housing in the Last Year: No     Unstable Housing in the Last Year: No       Meds     Home :   Prior to Admission medications    Medication Sig Start Date End Date Taking? Authorizing Provider   allopurinol (ZYLOPRIM) 100 MG tablet Take 1 tablet (100 mg) by mouth daily   Yes [provider]   atorvastatin (LIPITOR) 40 MG tablet Take 1 tablet (40 mg) by mouth daily   Yes [provider]   conjugated estrogens (PREMARIN) vaginal cream Place vaginally daily   Yes [provider]   insulin aspart (NOVOLOG) 100 UNIT/ML injection Inject 5-10 Units into the skin as needed   Yes [provider]   Insulin Degludec Evaristo Bury) 100 UNIT/ML Solution Inject 36 Units into the skin daily   Yes [provider]   metoprolol tartrate (LOPRESSOR) 25 MG tablet Take 0.5 tablets (12.5 mg) by mouth daily   Yes [provider]   montelukast (SINGULAIR) 10 MG tablet Take 1 tablet (10 mg) by mouth nightly   Yes [provider]   ondansetron (Zofran) 4 MG tablet Take 1 tablet (4 mg) by mouth every 8 (eight) hours as needed for Nausea 06/04/22  Yes Yousuf, Munib A, MD   oxybutynin XL (DITROPAN-XL) 5 MG 24 hr tablet Take 1 tablet (5 mg) by mouth daily   Yes [provider]   oxyCODONE (OXY-IR) 5 MG capsule Take 1 capsule (5 mg) by mouth every 4 (four) hours as needed   Yes [provider]   pantoprazole (PROTONIX) 40 MG tablet Take 1 tablet (40 mg) by mouth daily   Yes [provider]   pregabalin (LYRICA) 50 MG capsule Take 1 capsule (50 mg) by mouth Per pt has not been taking   Yes [provider]   sevelamer carbonate (RENVELA) 800 MG tablet Take 1 tablet (800 mg) by mouth 2 (two) times daily   Yes [provider]   torsemide (DEMADEX) 100 MG tablet Take 1 tablet (100 mg) by mouth daily   Yes [provider]   umeclidinium-vilanterol (Anoro Ellipta) 62.5-25 MCG/ACT Aerosol  Pwdr, Breath Activated Inhale 1 puff into the lungs daily   Yes [provider]   Zolpidem Tartrate (AMBIEN PO) Take 5 mg by mouth as needed   Yes [provider]   b complex vitamins capsule Take 1 capsule by mouth daily    [provider]   cinacalcet (SENSIPAR) 30 MG tablet Take 1 tablet (30 mg) by mouth daily    [provider]   clopidogrel (PLAVIX) 75 MG tablet Take 1 tablet (75 mg) by mouth daily    [provider]   hyoscyamine (LEVSIN) 0.125 MG tablet Take 1 tablet (0.125 mg) by mouth every 4 (four) hours as needed for Cramping 06/02/22   Yousuf, Munib A, MD      Inpatient :   Current Facility-Administered Medications   Medication Dose Route Frequency    albuterol-ipratropium  3 mL Nebulization Q6H WA    amiodarone  200 mg per NG tube Daily    aspirin  81 mg per NG tube Daily    darbepoetin alfa  0.45 mcg/kg Subcutaneous Weekly    glycopyrrolate  0.2 mg Intravenous Q8H    insulin glargine  7 Units Subcutaneous QAM    insulin lispro  2-10 Units Subcutaneous Q4H [facility]  meropenem  500 mg Intravenous Q24H    metoprolol tartrate  12.5 mg per NG tube Q12H [facility]    micafungin  100 mg Intravenous Q24H    ondansetron  4 mg Intravenous Once    pantoprazole  40 mg Intravenous BID    thiamine  100 mg Intravenous Q24H [facility]         Allergies    Metrizamide, Rosuvastatin, Iodinated contrast media, Pioglitazone, and Sulfa antibiotics      Review of Systems     All other systems were reviewed and are negative except for that mentioned in the HPI    Physical Exam:   Temp:  [97.4 F (36.3 C)-100.6 F (38.1 C)] 97.4 F (36.3 C)  Heart Rate:  [75-103] 103  Resp Rate:  [15-24] 17  BP: (99-172)/(51-85) 131/68       General: in no acute distress. Cooperative with the exam  Neck: supple  CVS: warm and well perfused  Resp: no respiratory distress  Extremities: no pedal edema, no rashes noted    Neurological Examination:  MSE: Eyes closed, no spontaneous opening, no opening to voice, no  verbal output, not following commands  CN: Pupils 4-->65mm b/l, right gaze deviation, no nystagmus, + Corneal, face symmetric  Motor/Sensory: flaccid in all 4 extremities, no response to nail bed pressure    Labs:     Results       Procedure Component Value Units Date/Time    Whole Blood Glucose POCT [[id number]]  (Abnormal) Collected: 07/18/22 0808    Specimen: Blood, Capillary Updated: 07/18/22 0819     Whole Blood Glucose POCT 177 mg/dL     Magnesium [[id number]]  (Normal) Collected: 07/18/22 0421    Specimen: Blood, Venous Updated: 07/18/22 0507     Magnesium 1.8 mg/dL     Phosphorus [[id number]]  (Normal) Collected: 07/18/22 0421    Specimen: Blood, Venous Updated: 07/18/22 0507     Phosphorus 2.4 mg/dL     Comprehensive Metabolic Panel [[id number]]  (Abnormal) Collected: 07/18/22 0421    Specimen: Blood, Venous Updated: 07/18/22 0507     Glucose 185 mg/dL      BUN 31 mg/dL      Creatinine 2.8 mg/dL      Sodium 629 mEq/L      Potassium 3.7 mEq/L      Chloride 101 mEq/L      CO2 25 mEq/L      Calcium 8.9 mg/dL      Anion Gap 52.8     GFR 16.1 mL/min/1.73 m2      AST (SGOT) 18 U/L      ALT 12 U/L      Alkaline Phosphatase 122 U/L      Albumin 1.8 g/dL      Protein, Total 4.9 g/dL      Globulin 3.1 g/dL      Albumin/Globulin Ratio 0.6     Bilirubin, Total 0.2 mg/dL     CBC with Differential [[id number]]  (Abnormal) Collected: 07/18/22 0421    Specimen: Blood, Venous Updated: 07/18/22 0446    Narrative:      The following orders were created for panel order CBC with Differential.  Procedure                               Abnormality         Status                     ---------                               -----------         ------  CBC with Differential[[id number]]        Abnormal            Final result                 Please view results for these tests on the individual orders.    CBC with Differential [[id number]]  (Abnormal) Collected: 07/18/22 0421    Specimen: Blood, Venous Updated: 07/18/22 0446      WBC 14.15 x10 3/uL      Hemoglobin 7.8 g/dL      Hematocrit 11.9 %      Platelet Count 323 x10 3/uL      MPV 10.9 fL      RBC 2.66 x10 6/uL      MCV 92.1 fL      MCH 29.3 pg      MCHC 31.8 g/dL      RDW 15 %      nRBC % 0.0 /100 WBC      Absolute nRBC 0.00 x10 3/uL      Preliminary Absolute Neutrophil Count 11.03 x10 3/uL      Neutrophils % 77.9 %      Lymphocytes % 10.0 %      Monocytes % 9.3 %      Eosinophils % 0.5 %      Basophils % 0.5 %      Immature Granulocytes % 1.8 %      Absolute Neutrophils 11.03 x10 3/uL      Absolute Lymphocytes 1.41 x10 3/uL      Absolute Monocytes 1.32 x10 3/uL      Absolute Eosinophils 0.07 x10 3/uL      Absolute Basophils 0.07 x10 3/uL      Absolute Immature Granulocytes 0.25 x10 3/uL     Iron Profile [[id number]] Collected: 07/18/22 0421    Specimen: Blood, Venous Updated: 07/18/22 0442    Ferritin [[id number]] Collected: 07/18/22 0421    Specimen: Blood, Venous Updated: 07/18/22 0442    Whole Blood Glucose POCT [[id number]]  (Abnormal) Collected: 07/18/22 0415    Specimen: Blood, Capillary Updated: 07/18/22 0426     Whole Blood Glucose POCT 175 mg/dL     Whole Blood Glucose POCT [[id number]]  (Abnormal) Collected: 07/17/22 2328    Specimen: Blood, Capillary Updated: 07/17/22 2332     Whole Blood Glucose POCT 163 mg/dL     Whole Blood Glucose POCT [[id number]]  (Abnormal) Collected: 07/17/22 1947    Specimen: Blood, Capillary Updated: 07/17/22 2004     Whole Blood Glucose POCT 144 mg/dL     Culture, Sputum and Lower Respiratory [[id number]]  (Abnormal) Collected: 07/04/22 1034    Specimen: Sputum, Suctioned Updated: 07/17/22 1849     Culture Respiratory Heavy growth of Mixed upper respiratory flora      Light growth of Gram negative rod     Gram Stain Few WBCs      Rare Squamous epithelial cells      Few Mixed respiratory flora    Whole Blood Glucose POCT [[id number]]  (Abnormal) Collected: 07/17/22 1548    Specimen: Blood, Capillary Updated: 07/17/22 1610     Whole Blood Glucose  POCT 161 mg/dL     Whole Blood Glucose POCT [[id number]]  (Abnormal) Collected: 07/17/22 1410    Specimen: Blood, Capillary Updated: 07/17/22 1527     Whole Blood Glucose POCT 116 mg/dL             Rads:     Results for orders placed or performed  during the hospital encounter of 06/28/22   CT Head WO Contrast    Narrative    HISTORY: Altered mental status.     COMPARISON: Brain MRI 09/20/2008.    TECHNIQUE: CT of the head performed without intravenous contrast. The  following dose reduction techniques were utilized: automated exposure  control and/or adjustment of the mA and/or KV according to patient size,  and the use of an iterative reconstruction technique.    CONTRAST: None.    FINDINGS:  The ventricles and sulcal pattern are within normal limits for the  patient's stated age. There is mild-moderate supratentorial chronic  ischemic disease. No acute infarct is noted. No intracranial hemorrhage is  seen. There is minimal paranasal sinus disease. There is moderate right  mastoid opacification. 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      Impression       1.No acute intracranial abnormality is seen.    Leandro Reasoner, MD  07/06/2022 3:56 AM           Elspeth Cho  [facility]  Available on Epic chat 7a to 7p

## 2022-07-18 NOTE — Progress Notes (Signed)
Routine EEG completed without activation procedures    Report to follow

## 2022-07-18 NOTE — Nursing Progress Note (Addendum)
Upgraded to CVNICU from U25 @1500 . Handoff NIH 25.     MRI brain completed.     Review of Systems:    NEURO: Q4h neuro checks. GCS 12 (E4, M4, V4). Drowsy/spontaneous eye movements. Oriented to person. Sluggish pupils bilaterally upon admission, not acute according to U25 RN. At handoff pupils were brisk bilaterally. Some comprehensible words. Globally aphasic. Unable to follow commands. NIH 25. R side gaze preference. No signs of pain.     RUE - 2/5  LUE - 1/5  RLE - 1/5  LLE -2/5    CV: NSR, HR 60-70s. SBP goal 120-160. SBP 90-130s. +1 pitting edema BUE. Palpable pulses.     RESP: On room air, clear/diminished lung sounds. spO2=91-95%.     GI: NPO. NDT @ 78, R nare. LBM prior to admission.     GU: Anuric. HD Tu, Thurs, Sat.     Skin: Wound vac abdomen. Output=87mL.     IV Access: LUE midline.

## 2022-07-18 NOTE — Progress Notes (Signed)
ID PROGRESS NOTE       Office: [phone]    Date Time: 07/18/22 12:44 PM  Patient Name: Shelby Barron, Shelby Barron      Updated problem List   Admitted 6/10 for scheduled cystectomy and completion nephrectomy     RCC s/p b/l nephrectomy, Newly diagnosed urothelial CA  - Prior RCC s/p partial b/l nephrectomy  - ESRD on PD previously -> HD 05/2022  - 05/2022 newly diagnosed urothelial Ca  - Admitted 6/10 for planned cystectomy, completion nephrectomy     Small bowel enterotomy  - Complication of her 6/10 procedure, repaired by gen surg  - Left in discontinuity given significant bleeding  - 6/12 RTOR for re-anastomosis, closure. Wound vac in place.      Leukocytosis, abdominal fluid collections, ?PNA  - Worsening WBCs since 6/15  - CT- 6/18 - post-op changes, small ascites, small collection of fluid/gas anterior midline abdomen, may be post-op, but superimposed infection cannot be ruled out  - Sputum Cx light GNRs     Chronic Conditions:  - COPD  - CVA 2004  - T2DM  - RCC s/p partial b/l nephrectomy  - [name]  - Urothelial cancer     Allergies:  - Hives with sulfa    Assessment:    More weak and withdrawn. Mental status seems worse.  WBC improved. Now with a new fever. Belly is soft but she is less responsive. Large wound vac in place. She has been on almost 3 weeks of IV abx covering gut flora, due to the intra-abdominal collections. Hard to assess clinically but seems to be improving. Last CT scan was almost 2 weeks ago. Tolerating tube feeds making stools. Back on dialysis with new permacath.     Given decline, and the fact that she is already on broad IV abx, would like to assess the abdomen.  She is elderly and has undergone a very aggressive surgical procedure.    Recommendations:   Continue meropenem and micafungin  Will add a single dose of vancomycin  Blood cxs today  CT of abdomen, in coordination with renal.  May need to pre-medicate, due to prior h/o dye intolerance  Goals of care may need to be  updated    Antibiotics:   #20 abx  #13 meropenem 500 mg IV daily  #11 micafungin 100 mg IV daily    IV lines:   R permacath 6/26  L midline 6/13     Allergies:     Allergies   Allergen Reactions    Metrizamide Shortness Of Breath and Nausea Only    Rosuvastatin Other (See Comments)     myopathy    myopathy   myopathy    myopathy myopathy      myopathy    myopathy, myopathy    Iodinated Contrast Media Other (See Comments)     Nausea and SOB per patient   On Dialysisi    Nausea and SOB per patient    Nausea and SOB per patient      Nausea and SOB per patient   Nausea and SOB per patient    Nausea and SOB per patient      Nausea and SOB per patient Nausea and SOB per patient      Nausea and SOB per patient Nausea and SOB per patient Nausea and SOB per patient      Nausea and SOB per patient  Nausea and SOB per patient Nausea and SOB per patient  Nausea and SOB per patient   Nausea and SOB per patient    Pioglitazone      Weight gain    Sulfa Antibiotics Hives     blister       Review of Systems:   More withdrawn, Nonverbal. Oxygenating ok on nasal prongs. NG tube in place. No urine output. Afebrile. Large wound vac in abd midline    Physical Exam:   BP 123/61   Pulse 88   Temp 98.6 F (37 C) (Oral)   Resp 17   Ht 1.575 m (5\' 2" )   Wt 101 kg (222 lb 10.6 oz)   SpO2 99%   BMI 40.73 kg/m   Weak withdrawn, no distress  HENT with NG tube and with nasal oxygen  Abdomen obese, soft nontender, with large midline wound vac.  Extrem thick  without phlebitis or edema.   No rash.   Select labs:     Recent Labs   Lab 07/18/22  0421   WBC 14.15*   Hemoglobin 7.8*   Hematocrit 24.5*   Platelet Count 323     Recent Labs   Lab 07/18/22  0421   Sodium 139   Potassium 3.7   Chloride 101   CO2 25   BUN 31*   Creatinine 2.8*   Calcium 8.9   Albumin 1.8*   Protein, Total 4.9*   Bilirubin, Total 0.2   Alkaline Phosphatase 122*   ALT 12   AST (SGOT) 18   Glucose 185*        Rads:   CXR:  1.Near-complete resolution of interstitial  edema.  2.Improving pulmonary vascular congestion.    Signed by: Guadalupe Maple, MD, MD

## 2022-07-18 NOTE — Progress Notes (Signed)
4 eyes in 4 hours pressure injury assessment note:      Completed with:   Unit & Time admitted:              Bony Prominences: Check appropriate box; if wound is present enter wound assessment in LDA     Occiput:                 [x]WNL  [] Wound present  Face:                     [x]WNL  [] Wound present  Ears:                      [x]WNL  [] Wound present  Spine:                    [x]WNL  [] Wound present  Shoulders:             [x]WNL  [] Wound present  Elbows:                  [x]WNL  [] Wound present  Sacrum/coccyx:     []WNL  [x] Wound present  Ischial Tuberosity:  [x]WNL  [] Wound present  Trochanter/Hip:      [x]WNL  [] Wound present  Knees:                   [x]WNL  [] Wound present  Ankles:                   [x]WNL  [] Wound present  Heels:                    [x]WNL  [] Wound present  Other pressure areas:  [] Wound location       Device related: [] Device name:         LDA completed if wound present: yes/no  Consult WOCN if necessary    Other skin related issues, ie tears, rash, etc, document in Integumentary flowsheet

## 2022-07-18 NOTE — Plan of Care (Signed)
Problem: Moderate/High Fall Risk Score >5  Goal: Patient will remain free of falls  Outcome: Progressing  Flowsheets (Taken 07/18/2022 1600)  High (Greater than 13):   HIGH-Visual cue at entrance to patient's room   HIGH-Bed alarm on at all times while patient in bed   HIGH-Apply yellow "Fall Risk" arm band   HIGH-Utilize chair pad alarm for patient while in the chair   HIGH-Pharmacy to initiate evaluation and intervention per protocol   HIGH-Initiate use of floor mats as appropriate   HIGH-Consider use of low bed     Problem: Neurological Deficit  Goal: Neurological status is stable or improving  Outcome: Progressing  Flowsheets (Taken 07/18/2022 1959)  Neurological status is stable or improving:   Monitor/assess/document neurological assessment (Stroke: every 4 hours)   Monitor/assess NIH Stroke Scale   Observe for seizure activity and initiate seizure precautions if indicated   Re-assess NIH Stroke Scale for any change in status   Perform CAM Assessment     Problem: Every Day - Stroke  Goal: Mobility/Activity is maintained at optimal level for patient  Outcome: Progressing  Flowsheets (Taken 07/18/2022 1959)  Mobility/activity is maintained at optimal level for patient:   Encourage independent activity per ability   Consult/collaborate with Physical Therapy and/or Occupational Therapy     Problem: Every Day - Stroke  Goal: Neurological status is stable or improving  Outcome: Progressing  Flowsheets (Taken 07/18/2022 1959)  Neurological status is stable or improving:   Monitor/assess/document neurological assessment (Stroke: every 4 hours)   Monitor/assess NIH Stroke Scale   Observe for seizure activity and initiate seizure precautions if indicated   Re-assess NIH Stroke Scale for any change in status   Perform CAM Assessment     Problem: Compromised Sensory Perception  Goal: Sensory Perception Interventions  Outcome: Progressing  Flowsheets (Taken 07/18/2022 1959)  Sensory Perception Interventions: Offload heels, Pad  bony prominences, Reposition q 2hrs/turn Clock, Q2 hour skin assessment under devices if present     Problem: Compromised Activity/Mobility  Goal: Activity/Mobility Interventions  Outcome: Progressing  Flowsheets (Taken 07/18/2022 1959)  Activity/Mobility Interventions: Pad bony prominences, TAP Seated positioning system when OOB, Promote PMP, Reposition q 2 hrs / turn clock, Offload heels     Problem: Compromised Nutrition  Goal: Nutrition Interventions  Outcome: Progressing  Flowsheets (Taken 07/18/2022 1959)  Nutrition Interventions: Discuss nutrition at Rounds, I&Os, Document % meal eaten, Daily weights     Problem: Compromised Friction/Shear  Goal: Friction and Shear Interventions  Outcome: Progressing  Flowsheets (Taken 07/18/2022 1959)  Friction and Shear Interventions: Pad bony prominences, Off load heels, HOB 30 degrees or less unless contraindicated, Consider: TAP seated positioning, Heel foams     Problem: Inadequate Airway Clearance  Goal: Patent Airway maintained  Outcome: Progressing  Flowsheets (Taken 07/18/2022 1959)  Patent airway maintained:   Position patient for maximum ventilatory efficiency   Provide adequate fluid intake to liquefy secretions   Suction secretions as needed   Reinforce use of ordered respiratory interventions (i.e. CPAP, BiPAP, Incentive Spirometer, Acapella, etc.)   Reposition patient every 2 hours and as needed unless able to self-reposition     Problem: Inadequate Airway Clearance  Goal: Normal respiratory rate/effort achieved/maintained  Outcome: Progressing  Flowsheets (Taken 07/18/2022 1959)  Normal respiratory rate/effort achieved/maintained: Plan activities to conserve energy: plan rest periods     Problem: Infection Prevention  Goal: Free from infection  Outcome: Progressing  Flowsheets (Taken 07/18/2022 1959)  Free from infection:   Monitor/assess vital signs  Encourage/assist patient to turn, cough and perform deep breathing every 2 hours   Assess incision for evidence of  healing   Monitor/assess output from surgical drain if present   Assess for signs and symptoms of infection   Monitor/assess lab values and report abnormal values     Problem: Impaired Mobility  Goal: Mobility/Activity is maintained at optimal level for patient  Outcome: Progressing  Flowsheets (Taken 07/18/2022 1959)  Mobility/activity is maintained at optimal level for patient:   Encourage independent activity per ability   Consult/collaborate with Physical Therapy and/or Occupational Therapy     Problem: Renal Instability  Goal: Fluid and electrolyte balance are achieved/maintained  Outcome: Progressing  Flowsheets (Taken 07/18/2022 1959)  Fluid and electrolyte balance are achieved/maintained:   Assess and reassess fluid and electrolyte status   Observe for cardiac arrhythmias   Monitor/assess lab values and report abnormal values   Monitor for muscle weakness   Follow fluid restrictions/IV/PO parameters     Problem: Patient Receiving Advanced Renal Therapies  Goal: Therapy access site remains intact  Outcome: Progressing  Flowsheets (Taken 07/18/2022 1959)  Therapy access site remains intact:   Assess therapy access site   Change therapy access site dressing as needed

## 2022-07-18 NOTE — Procedures (Signed)
Indication / History:     This electroencephalogram was recorded using both referential and differential montages. Using a digital machine the 18 channel International 10-20 System of electrode placement was used.      Background: The EEG was recorded with the patient awake, drowsy and asleep. The waking background activity in the occipital lobe consists of fairly well developed medium voltage 10-11 hz. activity that attenuates with eye opening. Some eye movement artifact and low voltage fast activity is seen frontally.   Drowsiness heralded by disorganization and slowing of the background. Stage II sleep was obtained with symmetric spindle activity and vertex waves.     Abnormal activity :         Hemispheric asymmetry : none    Photic stimulation produced no abnormality.    IMPRESSION: Abnormal EEG because of generalized low amplitude slowing intermixed with intermittent GPDs. No seizures. Overall picture c/w moderate to severe encephalopathy.              Richrd Humbles, MD. FAES    Director, [facility]  Associate Professor, [facility]  Board Certified,Neurology  Board Certified, Clinical Neurophysiology    [email or web address]

## 2022-07-18 NOTE — Progress Notes (Signed)
Report given to CVNICU RN. All belongings taken w/ pt.

## 2022-07-18 NOTE — Progress Notes (Signed)
[facility]  PROGRESS NOTE      Patient: Shelby Barron  Date:07/08/2022   LOS: 20 Days  Admission Date: 06/28/2022   MRN: [id number]  Attending: Gayleen Orem, MD  When on service as the attending, please contact me on Epic Secure Chat from 7 AM - 7 PM for non-urgent issues. For urgent matters use XTend page from 7 AM - 7 PM.       ASSESSMENT/PLAN     Shelby Barron is a 82 y.o. female admitted with Bladder cancer    Interval Summary: [name], [name], type 2 diabetes, renal cell carcinoma status post partial bilateral nephrectomy, [name], new diagnosis of urothelial cancer initally presented on 6/10 for elective cystectomy and completion nephrectomy, [name], [name], extubated and off pressors since 6/13.     She is hypoactive and delirious. Surgery recommended NGT and CTAP today. Plan to start TF today. CTH pending.  Has [name] T/Th/Sat. Nephrology following     Developed new onset afib RVR today during HD. Stopped norvasc switched to metoprolol and gave additional IV metoprolol. Consulted cardiology. Discussed with ICU requested a consult given deterioration. Recommended repeat blood cultures and broadening antibiotics to meropenem. Palliative care consulted.    Rising WBC. Blood cultures NGTD. Patient tachypneic CXR with pulm edema. Discussed with nephrology giving 3 mg IV bumex. Had dark emesis. Vitals stable. TFs held, started IV PPI, follow AM labs. Discussed with GI and surgery.    No further episodes of Afib RVR now on amiodarone gtt and PO metoprolol. Hgb stable. Repeating CXR, clear. Started robinul for upper airway secretions/rattling. Clinically improving.    New permacath placement planned for this week. Then hopefully NGT can come out if patient tolerating PO and discharge planning.    Patient Active Hospital Problem List:  #Encephalopathy/delirium,    Worsening of lethargy likely toxic metabolic  Patient is less responsive, exhibiting?  Cheyne stokes breathing  CT head was repeated on 6/29, no acute finding  ABG consistent with respiratory alkalosis  Discussed with neurology, recommended to do EEG  Given significant lethargy, episode of fever discussed with ID, blood culture ordered and vancomycin added  General surgery recommended to do CT scan with contrast, patient needs premedication  Patient transferred to ICU  Above plan discussed with intensivist        #S/P Cystectomy and completion nephrectomy complicated by small bowel enterotomy   #Mixed shock - hemorrhagic vs septic: Resolved  #Leukocytosis  - 6/10 small bowel enteretomy with 1L EBL. Went to the OR on June 12 for small bowel anastomosis, closure of mesenteric defect, closure of abdominal wall with wound VAC application  - Failed SLP eval . Wcont tube feeds per nutrition recs  -Wound VAC changed 6/15  - Surgery consulted, recommend CTAP with PO contrast to eval for intra-abdominal source of infection given uptrending WBC. Surgery ordered pre-medication with steroid/benadryl given h/o contrast allergy, showed no leak or enhancing fluid collection  -Continue meropenem/micafungin per ID recommendation, ID will determine duration of treatment  on 07/06/22, noted fever and lethargy after discussing with ID as well as surgical team, recommended to CT of the abdomen with contrast, patient needs premedication, patient transferred to ICU, surgery recommendation discussed with the intensivist.  ID added IV vancomycin and order blood culture  -Follow-up with general surgery and ID  -  Continue tube feeding      #Acute CVA on the right frontal lobe  #History of CVA  - Hold home plavix  - Adhere to day/night cycles and administer constant reorientation.   -Left upper extremity not moving at all unclear duration  -CT head showed right frontal infarction, neurology consulted recommended to start aspirin, discussed  with GI and GI cleared, discussed with daughter and daughter are agreeable.  -Neurology following along  -Discussed with neurology, recommended blood pressure target systolic between 1 20-1 60 and also recommended continue NIH stroke scale monitoring  -Stroke order set placed in  -Carotid Doppler showed plaque with no critical stenosis, MRAhead pending  -Neurology recommended to wait 10 days prior to initiation of anticoagulation given the size of the stroke.  -Repeat head CT done on 6/29 for lethargy showed no acute finding  -Patient becomes more lethargic, will be transferred to ICU for closer monitoring  -EEG and MRI ordered per    #Acute hypoxemic respiratory failure  #COPD  Extubated on 6/13, currently on room air  Maintain SaO2 > 92%  Continue as needed neb treatment  X-ray repeated on 6/29 showed resolution of interstitial edema    #ESRD  HD Tues/Thurs/Sat  Last dialysis on 6/29  Continue dialysis per nephrology  Nephrology following along    #Nonfunctioning right permacath  #Right Quinton IJ cath  IR removed the nonfunctional permacath and right [place] IJ cath and replaced with a new permacath on 6/26.    #HTN  On metoprolol   PRN IV labetalol for SBP > 180     New onset Afib with RVR  - cardiology following  -CHA2DS2-VASc score is 7, 11% yearly risk of stroke  - stable now on amiodarone gtt and metoprolol, cardiology recommended to switch amiodarone to 200 mg daily on 07/16/2022  -GI and general surgery cleared for anticoagulation  -Neurology recommended to hold anticoagulation for 10 days given the size of stroke      #Renal cell carcinoma  #Urothelial carcinoma  #Thrombocytopenia  - VTE ppx: SCDs, subcutaneous heparin     #Diabetes Mellitus type 2  -Sliding scale insulin      Palliative team reconsulted for evaluation, likely they will follow-up on Monday.    Nutrition: NPO, tube feeds  Malnutrition Documentation    Moderate Malnutrition related to inadequate nutritional intake in the setting of acute  illness as evidenced by <50% EER > 5 days, mild muscle losses (temporalis, deltoid) - new                 Patient has BMI=Body mass index is 40.73 kg/m.  Diagnosis: Obesity based on BMI criteria                                             Recent Labs   Lab 07/18/22  0421 07/17/22  0431 07/16/22  0244   Hemoglobin 7.8* 7.9* 7.9*   Hematocrit 24.5* 24.8* 24.3*   MCV 92.1 91.9 91.4   WBC 14.15* 15.03* 13.46*   Platelet Count 323 286 242     Recent Labs   Lab 07/18/22  0421 06/02/22  0514   Ferritin 2,300.21*  --    Iron 18* 62   TIBC 99* 145*   Iron Saturation 18 43       Code Status: Full Code    Dispo: pending improved mental status  Family Contact: Updated daughter    DVT Prophylaxis:   Current Facility-Administered Medications (Includes Only Anticoagulants, Misc. Hematological)   Medication Dose Route Last Admin   None          CHART  REVIEW & DISCUSSION     The following chart items were reviewed as of 2:22 PM on 07/18/22:  [x]  Lab Results [x]  Imaging Results   [x]  Problem List  [x]  Current Orders [x]  Current Medications  [x]  Allergies  []  Code Status []  Previous Notes   []  SDoH    The management and plan of care for this patient was discussed with the following specialty consultants:  [x]  Cardiology  [x]  Gastroenterology                 []  Infectious Disease  []  Pulmonology [x]  Neurology                [x]  Nephrology  []  Neurosurgery []  Orthopedic Surgery  [x]  ICU []  General Surgery []  Psychiatry                                   [x]  Palliative    SUBJECTIVE     Shelby Barron evaluated at the bedside very lethargic  Patient opens eyes for noxious stimuli  Unable to get review of system  MEDICATIONS     Current Facility-Administered Medications   Medication Dose Route Frequency    albuterol-ipratropium  3 mL Nebulization Q6H WA    amiodarone  200 mg per NG tube Daily    aspirin  81 mg per NG tube Daily    darbepoetin alfa  0.45 mcg/kg Subcutaneous Weekly    glycopyrrolate  0.2 mg Intravenous Q8H    insulin  glargine  7 Units Subcutaneous QAM    insulin lispro  2-10 Units Subcutaneous Q4H SCH    meropenem  500 mg Intravenous Q24H    metoprolol tartrate  12.5 mg per NG tube Q12H [facility]    micafungin  100 mg Intravenous Q24H    ondansetron  4 mg Intravenous Once    pantoprazole  40 mg Intravenous BID    thiamine  100 mg Intravenous Q24H SCH    vancomycin  1,250 mg Intravenous Once       PHYSICAL EXAM     Vitals:    07/18/22 1156   BP: 123/61   Pulse: 88   Resp: 17   Temp: 98.6 F (37 C)   SpO2: 99%       Temperature: Temp  Min: 97.4 F (36.3 C)  Max: 100.6 F (38.1 C)  Pulse: Pulse  Min: 75  Max: 103  Respiratory: Resp  Min: 17  Max: 24  Non-Invasive BP: BP  Min: 123/61  Max: 164/71  Pulse Oximetry SpO2  Min: 92 %  Max: 100 %    Intake and Output Summary (Last 24 hours) at Date Time  No intake or output data in the 24 hours ending 07/18/22 1422        GEN APPEARANCE: Normal;  A&OX0  HEENT: PERLA; EOMI; Conjunctiva Clear  NECK: Supple; No bruits  CVS: RRR, S1, S2; No M/G/R  LUNGS: CTAB; wheezing improved, no Rhonchi: No rales  ABD: Soft; No TTP; + Normoactive BS  EXT: No edema; Pulses 2+ and intact  NEURO: moving right upper extremity spontaneously, able to move lower extremities for pain but minimal, no movement at all on the left  upper extremity  MENTAL STATUS: Lethargic, oriented x 0, unable to follow command.  LABS     Recent Labs   Lab 07/18/22  0421 07/17/22  0431 07/16/22  0244   WBC 14.15* 15.03* 13.46*   RBC 2.66* 2.70* 2.66*   Hemoglobin 7.8* 7.9* 7.9*   Hematocrit 24.5* 24.8* 24.3*   MCV 92.1 91.9 91.4   Platelet Count 323 286 242       Recent Labs   Lab 07/18/22  0421 07/17/22  0431 07/16/22  0244 07/15/22  0427 07/14/22  0435   Sodium 139 135 134* 135 138   Potassium 3.7 3.8 3.4* 3.7 3.4*   Chloride 101 98* 98* 98* 100   CO2 25 23 27 23 27    BUN 31* 51* 32* 44* 24*   Creatinine 2.8* 3.7* 2.5* 3.6* 2.4*   Glucose 185* 164* 166* 145* 116*   Calcium 8.9 8.6 8.2 8.2 8.4   Magnesium 1.8 1.7 1.7 1.8 1.8       Recent  Labs   Lab 07/18/22  0421 07/16/22  0244 07/14/22  0435   ALT 12 11 11    AST (SGOT) 18 15 18    Bilirubin, Total 0.2 0.2 0.2   Albumin 1.8* 1.8* 1.8*   Alkaline Phosphatase 122* 129* 85             Recent Labs   Lab 07/14/22  0435   INR 1.1   PT 12.4   PTT 28         Microbiology Results (last 15 days)       Procedure Component Value Units Date/Time    Culture, Blood, Aerobic And Anaerobic [[id number]] Collected: 07/18/22 1400    Order Status: Sent Specimen: Blood, Venous Updated: 07/18/22 1412    Culture, Blood, Aerobic And Anaerobic [[id number]] Collected: 07/18/22 1400    Order Status: Sent Specimen: Blood, Venous Updated: 07/18/22 1416    Culture, Blood, Aerobic And Anaerobic [[id number]] Collected: 07/07/22 0355    Order Status: Completed Specimen: Blood, Venous Updated: 07/12/22 0700     Culture Blood No growth at 5 days    Culture, Blood, Aerobic And Anaerobic [[id number]] Collected: 07/06/22 1837    Order Status: Completed Specimen: Blood, Venous Updated: 07/12/22 0000     Culture Blood No growth at 5 days    Culture, Sputum and Lower Respiratory [[id number]]     Order Status: Sent Specimen: Sputum, Induced     Culture, Sputum and Lower Respiratory [[id number]]  (Abnormal) Collected: 07/04/22 1034    Order Status: Completed Specimen: Sputum, Suctioned Updated: 07/17/22 1849     Culture Respiratory Heavy growth of Mixed upper respiratory flora      Light growth of Gram negative rod     Comment: Questionable significance due to low quantity.  No further work.        Gram Stain Few WBCs      Rare Squamous epithelial cells      Few Mixed respiratory flora    Fungal Identification, Molds [[id number]] Collected: 07/04/22 1034    Order Status: Sent Specimen: Sputum, Suctioned Updated: 07/14/22 1251             RADIOLOGY     XR Chest AP Portable    Result Date: 07/17/2022  1.Near-complete resolution of interstitial edema. 2.Improving pulmonary vascular congestion. Legrand Pitts, MD 07/17/2022 2:38 PM    CT Head WO  Contrast    Result Date: 07/17/2022   1.No new acute intracranial pathology. 2.There are multiple  regions of cytotoxic edema in the right > left frontal and left > right occipital lobes, consistent with known early subacute infarcts. Dannielle Burn 07/17/2022 8:26 AM    US Carotid Duplex Dopp Comp Bilateral    Result Date: 07/15/2022   1. Plaque formation in the right internal carotid artery bulb with elevated velocities. Stenosis is estimated at 50-69%. 2. Plaque in the left internal carotid artery with normal velocities. Any stenosis is less than 50%. Criteria for stenosis and velocity parameters are based on the guidelines of the SRU (Society of Radiologists in Ultrasound). Grant et al: Carotid Artery Stenosis: Grayscale and Doppler Ultrasound Diagnosis-- Society of Radiologists in Ultrasound Consensus Conference Ultrasound Quarterly. Volume 19, Number 4; December, 2003. Recommendations updated October 2021 Verne Carrow is an [facility] accredited facility Suszanne Finch, MD 07/15/2022 9:41 AM    Tunneled Cath Removal (Permcath)    Result Date: 07/14/2022  1. Nonfunctioning tunneled dialysis catheter as described above. 2. Successful placement of a new 19 cm Equistream catheter as described above. Dimitrios Papadouris, MD 07/14/2022 4:06 PM    Tunneled Cath Placement (Permcath)    Result Date: 07/14/2022  1. Nonfunctioning tunneled dialysis catheter as described above. 2. Successful placement of a new 19 cm Equistream catheter as described above. Dimitrios Papadouris, MD 07/14/2022 4:06 PM    MRI Brain WO Contrast    Result Date: 07/13/2022   1.Patchy areas of acute/recent infarction involving the cerebral hemispheres bilaterally. The infarcts have a configuration suggesting watershed territory infarcts. 2.Chronic ischemic changes as discussed. Georgann Housekeeper, MD 07/13/2022 10:28 PM    CT Head WO Contrast    Result Date: 07/13/2022   1.New region of loss of gray-white matter differentiation in the right frontal lobe, concerning for acute  infarction. 2.A few additional areas of poor gray-white matter differentiation for example in the left occipital lobe may be artifactual but could be correlated with MRI. 3.No evidence of acute intracranial hemorrhage or significant mass effect. These urgent results were discussed with and acknowledged by Dr. Don Broach on 07/13/2022 3:20 PM. Vara Guardian, MD 07/13/2022 3:21 PM    XR Chest AP Portable    Result Date: 07/10/2022  Interstitial markings have slightly increased, suggesting minimal edema. No other change. Wilmon Pali, MD 07/10/2022 4:49 PM    XR Chest AP Portable    Result Date: 07/07/2022   There is mild interstitial prominence suggesting mild congestive heart failure/fluid overload in the setting of shortness of breath [name], MD 07/07/2022 12:32 PM    XR Chest AP Portable    Result Date: 07/06/2022   Hypoventilation with basilar atelectasis. Charlott Rakes, MD 07/06/2022 3:35 PM    CT Abdomen Pelvis W IV And PO Cont    Result Date: 07/06/2022  1.Postoperative changes from bilateral nephrectomy and cystectomy. 2.Small amount of ascites including some areas which appear loculated. Small collections of fluid and gas in the anterior midline abdomen may be postoperative but superimposed infection is not excluded. No rim-enhancing fluid collections or extravasation of oral contrast to suggest anastomotic leak. Demetrios Isaacs, MD 07/06/2022 5:51 AM    CT Head WO Contrast    Result Date: 07/06/2022   1.No acute intracranial abnormality is seen. Leandro Reasoner, MD 07/06/2022 3:56 AM    XR Abdomen AP    Result Date: 07/05/2022  Enteric tube terminates in the distal stomach. Elease Etienne, DO 07/05/2022 12:24 PM    XR Chest AP Portable    Result Date: 06/30/2022  1. Lines and tubes in adequate  position. No pneumothorax. 2. Trace left pleural effusion and bibasilar atelectasis. Jasmine December D'Heureux, MD 06/30/2022 4:53 PM    XR Chest AP Portable    Result Date: 06/30/2022  Catheters as described. Pankaj Dominica, MD 06/30/2022 2:07 PM    XR  Chest AP Portable    Result Date: 06/28/2022   Right mainstem bronchial intubation. The tube should be withdrawn about 4 cm. Dr. Tildon Husky is aware. Wynema Birch, MD 06/28/2022 4:56 PM    XR OR MISCOUNT    Result Date: 06/28/2022   No unexpected foreign body. These critical results were discussed with and acknowledged by Angie R., the circulating nurse, who will communicate the findings to Everardo All, MD on 06/28/2022 3:02 PM. Bosie Helper, MD 06/28/2022 3:03 PM   Echo Results       None          No results found for this or any previous visit.    Signed,  Gayleen Orem, MD  2:22 PM 07/18/2022

## 2022-07-18 NOTE — Progress Notes (Signed)
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 s/p bilateral radical nephrectomies and  cystectomy on 06/28/22  - complicated by small bowel enterotomy and blood loss   New onset afib - on amio gtt + metoprolol  High grade urothelial bladder CA: s/p bilateral nephrectomies and cystectomy c/b mall bowel enterotomy and blood loss (~1L) on 06/28/22  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 (approx 1L) - remains low  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 Prognosis appears poor  Iron sat low; if ferritin below 600, the IV iron  Started aranesp    Case discussed with: pt    Threasa Alpha, MD  [facility]  [phone] (office)  X [phone] (FFX Spectra-Link)      Subjective: awake, responds but remains confused       Review of Systems:   Unable to assess, patient is confused     Physical Exam:     Vitals:    07/18/22 0413 07/18/22 0500 07/18/22 0806 07/18/22 0826   BP: 136/69  136/68 131/68   Pulse: 99  (!) 103 (!) 103   Resp: (!) 24  17    Temp: 99.1 F (37.3 C)  (!) 100.6 F (38.1 C) 97.4 F (36.3 C)   TempSrc: Oral  Axillary Oral   SpO2: 98%  94%    Weight:  101 kg (222 lb 10.6 oz)     Height:           Intake and Output Summary (Last 24 hours)  at Date Time    Intake/Output Summary (Last 24 hours) at 07/18/2022 0940  Last data filed at 07/17/2022 1341  Gross per 24 hour   Intake --   Output 2100 ml   Net -2100 ml         General: confused, on NC, NGT in place; chronically ill appearing  Cardiovascular: regular rate and rhythm  Lungs: bilateral air entry  Abdomen: soft  Extremities: LUE edema  Access: R permacath   Meds:      Scheduled Meds: PRN Meds:    albuterol-ipratropium, 3 mL, Nebulization, Q6H WA  amiodarone, 200 mg, per NG tube, Daily  aspirin, 81 mg, per NG tube, Daily  darbepoetin alfa, 0.45 mcg/kg, Subcutaneous, Weekly  glycopyrrolate, 0.2 mg, Intravenous, Q8H  insulin glargine, 7 Units, Subcutaneous, QAM  insulin lispro, 2-10 Units, Subcutaneous, Q4H SCH  meropenem, 500 mg, Intravenous, Q24H  metoprolol tartrate, 12.5 mg, per NG tube, Q12H SCH  micafungin, 100 mg, Intravenous, Q24H  ondansetron, 4 mg, Intravenous, Once  pantoprazole, 40 mg, Intravenous, BID  thiamine, 100 mg, Intravenous, Q24H SCH          Continuous Infusions:  acetaminophen, 650 mg, Q6H PRN  albuterol-ipratropium, 3 mL, Q6H PRN  benzocaine, 1 spray, TID PRN  dextrose, 15 g of glucose, PRN   Or  dextrose, 12.5 g, PRN   Or  dextrose, 12.5 g, PRN   Or  glucagon (rDNA), 1 mg, PRN  diphenhydrAMINE, 25 mg, Q6H PRN  diphenhydrAMINE, 25 mg, Once PRN  hydrALAZINE, 10 mg, Q3H PRN  HYDROmorphone, 1 mg, Q3H PRN  labetalol, 10 mg, Q15 Min PRN  lidocaine 2% jelly, , Once PRN  naloxone, 0.2 mg, PRN  oxyCODONE, 5 mg, Q4H PRN  phenol, 1 spray, Q2H PRN              Labs:     Recent Labs   Lab 07/18/22  0421 07/17/22  0431 07/16/22  0244   WBC 14.15* 15.03* 13.46*   Hemoglobin 7.8* 7.9* 7.9*   Hematocrit 24.5* 24.8* 24.3*   Platelet Count 323 286 242     Recent Labs   Lab 07/18/22  0421 07/17/22  0431 07/16/22  0244 07/15/22  0427 07/14/22  0435   Sodium 139 135 134*  More results in Results Review 138   Potassium 3.7 3.8 3.4*  More results in Results Review 3.4*   Chloride 101 98* 98*  More  results in Results Review 100   CO2 25 23 27   More results in Results Review 27   BUN 31* 51* 32*  More results in Results Review 24*   Creatinine 2.8* 3.7* 2.5*  More results in Results Review 2.4*   Calcium 8.9 8.6 8.2  More results in Results Review 8.4   Albumin 1.8*  --  1.8*  --  1.8*   Phosphorus 2.4 3.5 2.1*  More results in Results Review 2.9   Magnesium 1.8 1.7 1.7  More results in Results Review 1.8   Glucose 185* 164* 166*  More results in Results Review 116*   GFR 16.1* 11.5* 18.4*  More results in Results Review 19.3*   More results in Results Review = values in this interval not displayed.           Signed by: Threasa Alpha, MD, FASN

## 2022-07-18 NOTE — H&P (Addendum)
[facility]- Medical Critical Care Service [facility])      ADMISSION- HISTORY AND PHYSICAL EXAM      Date Time: 07/18/22 2:05 PM  Patient Name: Shelby Barron  Attending Physician: Gayleen Orem, MD  Primary Care Physician: Patsy Lager, MD  Location/Room: [phone]     Chief Complaint / Primary reason for ICU evaluation:  Worsening neurological status    History of Presenting Illness:   Shelby Barron is a 82 y.o. female with Hx of COPD, prior CVA (2004), T2DM, renal cell carcinoma status post partial bilateral nephrectomy, [name], and recent diagnosis of urothelial cancer.  Patient initially admitted on 6/10 for scheduled cystectomy and complete nephrectomy.  OR course was complicated by small bowel enterotomy and significant intraoperative bleeding (1 L EBL), patient was admitted to ICU in discontinuity with open abdomen.  Patient's hemorrhagic shock was treated with blood transfusions, also treated with broad-spectrum antibiotics. Patient went to the OR on 6/12 for small bowel anastomosis and closure with wound VAC placement.  Patient weaned off pressors and extubated to nasal cannula pm 6/13. Initially patient required CRRT due to shock, decision to IHD on 6/15.  Course was further complicated by delirium and encephalopathy.  Patient downgraded out of ICU on 6/16.    Patient continued to be encephalopathic with episode of improvement however on June 25 noticed weakness of left upper extremity and MRI showed areas of recent infarction involving the cerebral hemisphere bilaterally and watershed territory pattern.  Patient continued treatment in the hospital with hemodialysis antibiotic wound care.  She continued on treatment for A-fib with amiodarone and she continued on aspirin as well as.  No therapeutic anticoagulation started yet.  She also continued on multiple antibiotic allergies infectious disease team giving intra-abdominal pathology.  Reportedly starting on June 29 patient is  more lethargic, she moans and not able to say her name which is a change compared to a few days prior to this, still able to move limbs except the left upper extremity.  Also noticed change in breathing pattern which is now resolved and child stops breathing.  Primary team did a complete metabolic workup without clear underlying etiology for his worsening in neurological status  Problem List:   Problem List:   Patient Active Problem List   Diagnosis    Type 2 diabetes mellitus with kidney complication, with long-term current use of insulin    Diabetes mellitus type II, uncontrolled    Neuropathy of hand    Left renal mass    Hamstring injury    Peritonitis    ESRD (end stage renal disease)    Chronic diastolic congestive heart failure    Aortic atherosclerosis    Malignant neoplasm of urinary bladder, unspecified site    Malignant tumor of kidney, left    Benign essential hypertension    GERD (gastroesophageal reflux disease)    Leukocytosis    Bladder cancer    Anemia    COPD (chronic obstructive pulmonary disease)       Plan:   Dispo: Discussed with neurology and primary team.  Will obtain EEG and MRI brain.  Will transfer to ICU for close monitoring.  Discussed with patient's daughter at bedside.  Current working diagnosis is delirium till further workup results are back.    Critical Care services by organ system  NEURO:   -Acute changes in neurological status: Delirium versus other underlying etiology  - Recent watershed stroke  - History of [name]  Discussed with neurology:  - Spot EEG  - MRI brain without contrast  - Neurocheck every 4 hours  - Continue with aspirin, may be able to start systemic anticoagulation on July 5 [10 days post initial diagnosis of stroke as recommended by neurology team]    CV:   Blood pressure goal: MAP above 65  -Paroxysmal A-fib: Currently rate controlled, continue with amiodarone and beta-blocker    PULM:   Maintaining adequate oxygenation and ventilation, currently  protecting airway  ABG showing some respiratory alkalosis, could be secondary to Jarrett Soho- Stokes pattern breathing which may be related to her neurological status    GI:   GI PPX: Pantoprazole  Nutrition: Advance tube feeding as tolerated    RENAL:   -S/P Cystectomy and completion nephrectomy complicated by small bowel enterotomy   Appreciate nephrology, on hemodialysis 3 times a week TTS via newly inserted permacath on June 26    ID:   -Peritonitis and possible intra-abdominal fluid collection status post iatrogenic small bowel enterotomy:  WBC trending down, Tmax 100.6, abdominal exam is benign, wound VAC remains in place,  Appreciate infectious disease recommendation  Will continue with meropenem 500 mg IV daily as well as micafungin 100 mg IV daily  -As recommended by infectious disease team, will arrange for CT abdomen pelvis with contrast after premedication    HEME:   SCD's  Pharmacologic ppx: Subcu heparin    ENDO/RHEUM:   Glucose: goal 100 -180  Lantus and sliding scale    Code Status: Full Code  Patient is currently not able to make medical decisions.      Patients next of kin is daughter and was present.    Discussed current diagnoses, prognosis and goals of care.     Past Medical History:     Past Medical History:   Diagnosis Date    Acid reflux     Asthma, well controlled     Cancer of kidney     Chronic lower back pain     Congestive heart disease     COPD (chronic obstructive pulmonary disease)     CVA (cerebral infarction) 01/12/2003    Diabetes     Diabetic nephropathy     Fibroids     Gout     H/O: gout     Hyperlipidemia     Hypertension     Neuropathy of hand     SOB (shortness of breath)        Past Surgical History:     Past Surgical History:   Procedure Laterality Date    APPENDECTOMY (OPEN)      CLOSURE, ENTEROTOMY, SMALL INTESTINE  06/28/2022    Procedure: CLOSURE, ENTEROTOMY, SMALL INTESTINE;  Surgeon: Marlaine Hind, MD;  Location: [facility];  Service: General;;    CYSTECTOMY, ILEOCONDUIT  N/A 06/28/2022    Procedure: CYSTECTOMY, RADICAL;  Surgeon: Neldon Newport, MD;  Location: [facility];  Service: Urology;  Laterality: N/A;    DRAIN (OTHER) N/A 05/31/2022    Procedure: DRAIN (OTHER);  Surgeon: Hope Pigeon, MD;  Location: [facility];  Service: Interventional Radiology;  Laterality: N/A;    EXPLORATORY LAPAROTOMY N/A 06/30/2022    Procedure: SMALL BOWEL ANASTOMOSIS, CLOSURE OF MESENTERIC DEFECT, CLOSURE OF ABDOMINAL WALL;  Surgeon: Marlaine Hind, MD;  Location: [facility];  Service: General;  Laterality: N/A;  **[id number]/ MD AVAIL [id number] OR AFTER 1600**    EXPLORATORY LAPAROTOMY, RESECTION SMALL BOWEL N/A 06/28/2022    Procedure: RESECTION SMALL BOWEL;  Surgeon: Marlaine Hind, MD;  Location: [facility];  Service: General;  Laterality: N/A;    HERNIA REPAIR      HYSTERECTOMY      LAPAROTOMY, NEPHRECTOMY Bilateral 06/28/2022    Procedure: BILATERAL OPEN RADICAL NEPHRECTOMY AND LYSIS OF ASHESION;  Surgeon: Neldon Newport, MD;  Location: [facility];  Service: Urology;  Laterality: Bilateral;    PARTIAL NEPHRECTOMY      tumor removed from kidney  cJanuary 2015    TUNNELED CATH PLACEMENT (PERMCATH) N/A 06/01/2022    Procedure: TUNNELED CATH PLACEMENT;  Surgeon: Suszanne Finch, MD;  [facility]: [facility];  Service: Interventional Radiology;  Laterality: N/A;    TUNNELED CATH PLACEMENT (PERMCATH) Right 07/14/2022    Procedure: TUNNELED CATH PLACEMENT;  Surgeon: Hope Pigeon, MD;  [facility];  Service: Interventional Radiology;  Laterality: Right;  right IJ tunneled HD catheter placement    TUNNELED CATH REMOVAL (PERMCATH) Right 07/14/2022    Procedure: TUNNELED CATH REMOVAL;  Surgeon: Hope Pigeon, MD;  [facility]: [facility];  Service: Interventional Radiology;  Laterality: Right;  right neck/chest tunneled HD catheter removal    TURBT      WOUND VAC APPLICATION N/A 06/28/2022    Procedure: Tawni Pummel WOUND VAC APPLICATION;  Surgeon: Marlaine Hind, MD;  [facility];  Service: General;  Laterality: N/A;    WOUND VAC APPLICATION N/A 06/30/2022    Procedure: WOUND VAC APPLICATION;  Surgeon: Marlaine Hind, MD;  [facility]: [facility];  Service: General;  Laterality: N/A;       Family History:     Family History   Problem Relation Age of Onset    Cancer Mother     Heart failure Father     Cancer Maternal Aunt     Diabetes Maternal Aunt     Cancer Paternal Aunt        Social History:     Social History     Socioeconomic History    Marital status: Married     Spouse name: Not on file    Number of children: Not on file    Years of education: Not on file    Highest education level: Not on file   Occupational History    Not on file   Tobacco Use    Smoking status: Former    Smokeless tobacco: Never   Vaping Use    Vaping status: Never Used   Substance and Sexual Activity    Alcohol use: No    Drug use: No    Sexual activity: Not on file   Other Topics Concern    Not on file   Social History Narrative    Not on file     Social Determinants of Health     Financial Resource Strain: Not on file   Food Insecurity: No Food Insecurity (05/26/2022)    Hunger Vital Sign     Worried About Running Out of Food in the Last Year: Never true     Ran Out of Food in the Last Year: Never true   Transportation Needs: No Transportation Needs (05/26/2022)    PRAPARE - Therapist, art (Medical): No     Lack of Transportation (Non-Medical): No   Physical Activity: Not on file   Stress: Not on file   Social Connections: Unknown (02/01/2022)    Received from [facility]    Social Network     Social Network: Not on file  Intimate Partner Violence: Not At Risk (05/26/2022)    Humiliation, Afraid, Rape, and Kick questionnaire     Fear of Current or Ex-Partner: No     Emotionally Abused: No     Physically Abused: No     Sexually Abused: No   Housing Stability: Unknown (05/26/2022)    Housing Stability Vital Sign     Unable to Pay for Housing in the Last Year: No     Number of Places Lived in  the Last Year: Not on file     Unstable Housing in the Last Year: No       Allergies:     Allergies   Allergen Reactions    Metrizamide Shortness Of Breath and Nausea Only    Rosuvastatin Other (See Comments)     myopathy    myopathy   myopathy    myopathy myopathy      myopathy    myopathy, myopathy    Iodinated Contrast Media Other (See Comments)     Nausea and SOB per patient   On Dialysisi    Nausea and SOB per patient    Nausea and SOB per patient      Nausea and SOB per patient   Nausea and SOB per patient    Nausea and SOB per patient      Nausea and SOB per patient Nausea and SOB per patient      Nausea and SOB per patient Nausea and SOB per patient Nausea and SOB per patient      Nausea and SOB per patient  Nausea and SOB per patient Nausea and SOB per patient    Nausea and SOB per patient   Nausea and SOB per patient    Pioglitazone      Weight gain    Sulfa Antibiotics Hives     blister       Medications:   Hospital medications:    Current Facility-Administered Medications   Medication Dose Route Frequency    albuterol-ipratropium  3 mL Nebulization Q6H WA    amiodarone  200 mg per NG tube Daily    aspirin  81 mg per NG tube Daily    darbepoetin alfa  0.45 mcg/kg Subcutaneous Weekly    glycopyrrolate  0.2 mg Intravenous Q8H    insulin glargine  7 Units Subcutaneous QAM    insulin lispro  2-10 Units Subcutaneous Q4H SCH    meropenem  500 mg Intravenous Q24H    metoprolol tartrate  12.5 mg per NG tube Q12H [facility]    micafungin  100 mg Intravenous Q24H    ondansetron  4 mg Intravenous Once    pantoprazole  40 mg Intravenous BID    thiamine  100 mg Intravenous Q24H SCH    vancomycin  1,250 mg Intravenous Once        Current Facility-Administered Medications   Medication       Review of Systems:   Due to critical status of the patient at this time, ROS was not obtained.    Physical Exam:   Temp:  [97.4 F (36.3 C)-100.6 F (38.1 C)] 98.6 F (37 C)  Heart Rate:  [75-103] 88  Resp Rate:  [17-24] 17  BP:  (123-164)/(61-71) 123/61    Intake and Output Summary (Last 24 hours) at Date Time  No intake or output data in the 24 hours ending 07/18/22 1405    Vent Settings:       BP 123/61   Pulse 88  Temp 98.6 F (37 C) (Oral)   Resp 17   Ht 1.575 m (5\' 2" )   Wt 101 kg (222 lb 10.6 oz)   SpO2 99%   BMI 40.73 kg/m      General Appearance: Chronically ill looking female not in clear distress, poorly responsive  Mental status: Not oriented, alert  Neuro: Face is symmetrical, questionable right gaze deviation, pupil equal reactive bilaterally, good gag and cough reflex, not coherent speech at times, weak left arm, moving other extremities to painful stimuli but not to command  Lungs: Coarse crepitation bilaterally  Cardiac: Normal heart sound no added sound  Abdomen: Soft and lax nontender, wound VAC in place  Extremities: No edema  Skin:   Did not appreciate pathological rash      Labs:     Results       Procedure Component Value Units Date/Time    Whole Blood Glucose POCT [[id number]]  (Abnormal) Collected: 07/18/22 1158    Specimen: Blood, Capillary Updated: 07/18/22 1200     Whole Blood Glucose POCT 183 mg/dL     Arterial Blood Gas [[id number]]  (Abnormal) Collected: 07/18/22 1014    Specimen: Blood, Arterial Updated: 07/18/22 1023     Arterial pH 7.503     Arterial pCO2 32.8 mmHg      Arterial pO2 84.2 mmHg      Arterial HCO3 25.5 mEq/L      Arterial CO2 24.0 mEq/L      Arterial Base Excess 2.8 mEq/L      Arterial O2 Saturation 97.1 %      Temperature 37.0 C      FIO2 28 %      O2 Delivery Nasal Cannula     O2 Liter Flow 2.0 L/min     Ferritin [[id number]]  (Abnormal) Collected: 07/18/22 0421    Specimen: Blood, Venous Updated: 07/18/22 1018     Ferritin 2,300.21 ng/mL     Iron Profile [[id number]]  (Abnormal) Collected: 07/18/22 0421    Specimen: Blood, Venous Updated: 07/18/22 0938     Iron 18 ug/dL      UIBC 81 ug/dL      TIBC 99 ug/dL      Iron Saturation 18 %     Whole Blood Glucose POCT [[id number]]  (Abnormal)  Collected: 07/18/22 0808    Specimen: Blood, Capillary Updated: 07/18/22 0819     Whole Blood Glucose POCT 177 mg/dL     Magnesium [[id number]]  (Normal) Collected: 07/18/22 0421    Specimen: Blood, Venous Updated: 07/18/22 0507     Magnesium 1.8 mg/dL     Phosphorus [[id number]]  (Normal) Collected: 07/18/22 0421    Specimen: Blood, Venous Updated: 07/18/22 0507     Phosphorus 2.4 mg/dL     Comprehensive Metabolic Panel [[id number]]  (Abnormal) Collected: 07/18/22 0421    Specimen: Blood, Venous Updated: 07/18/22 0507     Glucose 185 mg/dL      BUN 31 mg/dL      Creatinine 2.8 mg/dL      Sodium 188 mEq/L      Potassium 3.7 mEq/L      Chloride 101 mEq/L      CO2 25 mEq/L      Calcium 8.9 mg/dL      Anion Gap 41.6     GFR 16.1 mL/min/1.73 m2      AST (SGOT) 18 U/L      ALT 12 U/L      Alkaline Phosphatase 122 U/L  Albumin 1.8 g/dL      Protein, Total 4.9 g/dL      Globulin 3.1 g/dL      Albumin/Globulin Ratio 0.6     Bilirubin, Total 0.2 mg/dL     CBC with Differential [[id number]]  (Abnormal) Collected: 07/18/22 0421    Specimen: Blood, Venous Updated: 07/18/22 0446    Narrative:      The following orders were created for panel order CBC with Differential.  Procedure                               Abnormality         Status                     ---------                               -----------         ------                     CBC with Differential[[id number]]        Abnormal            Final result                 Please view results for these tests on the individual orders.    CBC with Differential [[id number]]  (Abnormal) Collected: 07/18/22 0421    Specimen: Blood, Venous Updated: 07/18/22 0446     WBC 14.15 x10 3/uL      Hemoglobin 7.8 g/dL      Hematocrit 13.0 %      Platelet Count 323 x10 3/uL      MPV 10.9 fL      RBC 2.66 x10 6/uL      MCV 92.1 fL      MCH 29.3 pg      MCHC 31.8 g/dL      RDW 15 %      nRBC % 0.0 /100 WBC      Absolute nRBC 0.00 x10 3/uL      Preliminary Absolute Neutrophil Count 11.03 x10  3/uL      Neutrophils % 77.9 %      Lymphocytes % 10.0 %      Monocytes % 9.3 %      Eosinophils % 0.5 %      Basophils % 0.5 %      Immature Granulocytes % 1.8 %      Absolute Neutrophils 11.03 x10 3/uL      Absolute Lymphocytes 1.41 x10 3/uL      Absolute Monocytes 1.32 x10 3/uL      Absolute Eosinophils 0.07 x10 3/uL      Absolute Basophils 0.07 x10 3/uL      Absolute Immature Granulocytes 0.25 x10 3/uL     Whole Blood Glucose POCT [[id number]]  (Abnormal) Collected: 07/18/22 0415    Specimen: Blood, Capillary Updated: 07/18/22 0426     Whole Blood Glucose POCT 175 mg/dL     Whole Blood Glucose POCT [[id number]]  (Abnormal) Collected: 07/17/22 2328    Specimen: Blood, Capillary Updated: 07/17/22 2332     Whole Blood Glucose POCT 163 mg/dL     Whole Blood Glucose POCT [[id number]]  (Abnormal) Collected: 07/17/22 1947    Specimen: Blood, Capillary Updated: 07/17/22 2004     Whole Blood Glucose  POCT 144 mg/dL     Culture, Sputum and Lower Respiratory [[id number]]  (Abnormal) Collected: 07/04/22 1034    Specimen: Sputum, Suctioned Updated: 07/17/22 1849     Culture Respiratory Heavy growth of Mixed upper respiratory flora      Light growth of Gram negative rod     Gram Stain Few WBCs      Rare Squamous epithelial cells      Few Mixed respiratory flora    Whole Blood Glucose POCT [[id number]]  (Abnormal) Collected: 07/17/22 1548    Specimen: Blood, Capillary Updated: 07/17/22 1610     Whole Blood Glucose POCT 161 mg/dL     Whole Blood Glucose POCT [[id number]]  (Abnormal) Collected: 07/17/22 1410    Specimen: Blood, Capillary Updated: 07/17/22 1527     Whole Blood Glucose POCT 116 mg/dL             Microbiology Results (last 15 days)       Procedure Component Value Units Date/Time    Culture, Blood, Aerobic And Anaerobic [[id number]] Collected: 07/18/22 1400    Order Status: Sent Specimen: Blood, Venous     Culture, Blood, Aerobic And Anaerobic [[id number]] Collected: 07/18/22 1400    Order Status: Sent Specimen: Blood,  Venous     Culture, Blood, Aerobic And Anaerobic [[id number]] Collected: 07/07/22 0355    Order Status: Completed Specimen: Blood, Venous Updated: 07/12/22 0700     Culture Blood No growth at 5 days    Culture, Blood, Aerobic And Anaerobic [[id number]] Collected: 07/06/22 1837    Order Status: Completed Specimen: Blood, Venous Updated: 07/12/22 0000     Culture Blood No growth at 5 days    Culture, Sputum and Lower Respiratory [[id number]]     Order Status: Sent Specimen: Sputum, Induced     Culture, Sputum and Lower Respiratory [[id number]]  (Abnormal) Collected: 07/04/22 1034    Order Status: Completed Specimen: Sputum, Suctioned Updated: 07/17/22 1849     Culture Respiratory Heavy growth of Mixed upper respiratory flora      Light growth of Gram negative rod     Comment: Questionable significance due to low quantity.  No further work.        Gram Stain Few WBCs      Rare Squamous epithelial cells      Few Mixed respiratory flora    Fungal Identification, Molds [[id number]] Collected: 07/04/22 1034    Order Status: Sent Specimen: Sputum, Suctioned Updated: 07/14/22 1251            Radiology / Imaging:     XR Chest AP Portable   Final Result      1.Near-complete resolution of interstitial edema.   2.Improving pulmonary vascular congestion.      Legrand Pitts, MD   07/17/2022 2:38 PM      CT Head WO Contrast   Final Result       1.No new acute intracranial pathology.   2.There are multiple regions of cytotoxic edema in the right > left frontal   and left > right occipital lobes, consistent with known early subacute   infarcts.      Dannielle Burn   07/17/2022 8:26 AM      US Carotid Duplex Dopp Comp Bilateral   Final Result       1. Plaque formation in the right internal carotid artery bulb with elevated   velocities. Stenosis is estimated at 50-69%.   2. Plaque in the left internal carotid artery with normal velocities. Any  stenosis is less than 50%.      Criteria for stenosis and velocity parameters are based on the  guidelines   of the SRU (Society of Radiologists in Ultrasound).   Grant et al: Carotid Artery Stenosis: Grayscale and Doppler Ultrasound   Diagnosis-- Society of Radiologists in Ultrasound Consensus Conference   Ultrasound Quarterly. Volume 19, Number 4; December, 2003.   Recommendations updated October 2021      [facility] Carrow is an [facility] accredited facility      Suszanne Finch, MD   07/15/2022 9:41 AM      Tunneled Cath Removal (Permcath)   Final Result      1. Nonfunctioning tunneled dialysis catheter as described above.   2. Successful placement of a new 19 cm Equistream catheter as described   above.      Dimitrios Papadouris, MD   07/14/2022 4:06 PM      Tunneled Cath Placement (Permcath)   Final Result      1. Nonfunctioning tunneled dialysis catheter as described above.   2. Successful placement of a new 19 cm Equistream catheter as described   above.      Dimitrios Papadouris, MD   07/14/2022 4:06 PM      MRI Brain WO Contrast   Final Result       1.Patchy areas of acute/recent infarction involving the cerebral   hemispheres bilaterally. The infarcts have a configuration suggesting   watershed territory infarcts.   2.Chronic ischemic changes as discussed.      Georgann Housekeeper, MD   07/13/2022 10:28 PM      CT Head WO Contrast   Final Result       1.New region of loss of gray-white matter differentiation in the right   frontal lobe, concerning for acute infarction.    2.A few additional areas of poor gray-white matter differentiation for   example in the left occipital lobe may be artifactual but could be   correlated with MRI.   3.No evidence of acute intracranial hemorrhage or significant mass effect.      These urgent results were discussed with and acknowledged by Dr. Don Broach on   07/13/2022 3:20 PM.      Vara Guardian, MD   07/13/2022 3:21 PM      XR Chest AP Portable   Final Result      Interstitial markings have slightly increased, suggesting minimal edema. No   other change.      Wilmon Pali, MD   07/10/2022 4:49 PM       XR Chest AP Portable   Final Result    There is mild interstitial prominence suggesting mild   congestive heart failure/fluid overload in the setting of shortness of   breath      [name], MD   07/07/2022 12:32 PM      XR Chest AP Portable   Final Result       Hypoventilation with basilar atelectasis.      Charlott Rakes, MD   07/06/2022 3:35 PM      CT Abdomen Pelvis W IV And PO Cont   Final Result      1.Postoperative changes from bilateral nephrectomy and cystectomy.    2.Small amount of ascites including some areas which appear loculated.   Small collections of fluid and gas in the anterior midline abdomen may be   postoperative but superimposed infection is not excluded. No rim-enhancing   fluid collections or extravasation of oral contrast to suggest anastomotic  leak.      Demetrios Isaacs, MD   07/06/2022 5:51 AM      CT Head WO Contrast   Final Result       1.No acute intracranial abnormality is seen.      Leandro Reasoner, MD   07/06/2022 3:56 AM      XR Abdomen AP   Final Result      Enteric tube terminates in the distal stomach.      Elease Etienne, DO   07/05/2022 12:24 PM      XR Chest AP Portable   Final Result         1. Lines and tubes in adequate position. No pneumothorax.   2. Trace left pleural effusion and bibasilar atelectasis.      Jasmine December D'Heureux, MD   06/30/2022 4:53 PM      XR Chest AP Portable   Final Result      Catheters as described.      Pankaj Dominica, MD   06/30/2022 2:07 PM      XR Chest AP Portable   Final Result    Right mainstem bronchial intubation. The tube should be   withdrawn about 4 cm.      Dr. Tildon Husky is aware.      Wynema Birch, MD   06/28/2022 4:56 PM      XR OR MISCOUNT   Final Result    No unexpected foreign body.      These critical results were discussed with and acknowledged by Angie R.,   the circulating nurse, who will communicate the findings to Everardo All,   MD on 06/28/2022 3:02 PM.         Bosie Helper, MD   06/28/2022 3:03 PM      MRI Brain WO Contrast    (Results  Pending)       I have personally reviewed the patient's history and 24 hour interval events, along with vitals, labs, radiology images, and ventilator settings and additional findings found in detail within ICU team notes from house staff, NPPs and nursing, with their care plans developed and reviewed by me. So far today, I have spent 50 minutes providing critical care for this patient excluding teaching and billable procedures, not overlapping with over providers.     Signed by: Laverda Sorenson, MD  07/18/2022 2:05 PM  Cc:Pcp, Kathreen Cosier, MD      This note was generated by the Epic EMR system/ Dragon speech recognition and may contain inherent errors or omissions not intended by the user. Grammatical errors, random word insertions, deletions, pronoun errors and incomplete sentences are occasional consequences of this technology due to software limitations. Not all errors are caught or corrected. If there are questions or concerns about the content of this note or information contained within the body of this dictation they should be addressed directly with the author for clarification

## 2022-07-18 NOTE — SLP Progress Note (Signed)
[facility]    Speech Therapy Treatment Note Attempt   Patient: Shelby Barron MRN#: [id number]   Unit: [facility] Room/Bed: V4098/J1914-N       Attempted to see patient at approximately 9:00 am.         SLP Order Cancellation Reason: Provider/Medical Hold (comment required) - Provider/RN decision Pt not appropriate for treatment at this time per RN. Will continue to monitor for appropriateness.       07/18/2022   Claudie Revering., CCC-SLP  [id number] Language Pathology

## 2022-07-19 ENCOUNTER — Inpatient Hospital Stay: Payer: No Typology Code available for payment source

## 2022-07-19 DIAGNOSIS — I639 Cerebral infarction, unspecified: Secondary | ICD-10-CM

## 2022-07-19 DIAGNOSIS — E440 Moderate protein-calorie malnutrition: Secondary | ICD-10-CM

## 2022-07-19 DIAGNOSIS — R410 Disorientation, unspecified: Secondary | ICD-10-CM

## 2022-07-19 LAB — BASIC METABOLIC PANEL
Anion Gap: 14 (ref 5.0–15.0)
BUN: 45 mg/dL — ABNORMAL HIGH (ref 7–21)
CO2: 23 mEq/L (ref 17–29)
Calcium: 9 mg/dL (ref 7.9–10.2)
Chloride: 102 mEq/L (ref 99–111)
Creatinine: 3.9 mg/dL — ABNORMAL HIGH (ref 0.4–1.0)
GFR: 10.8 mL/min/{1.73_m2} — ABNORMAL LOW (ref >=60.0)
Glucose: 196 mg/dL — ABNORMAL HIGH (ref 70–100)
Potassium: 4.5 mEq/L (ref 3.5–5.3)
Sodium: 139 mEq/L (ref 135–145)

## 2022-07-19 LAB — CBC
Absolute nRBC: 0 10*3/uL (ref <=0.00)
Hematocrit: 22.5 % — ABNORMAL LOW (ref 34.7–43.7)
Hemoglobin: 7.4 g/dL — ABNORMAL LOW (ref 11.4–14.8)
MCH: 30 pg (ref 25.1–33.5)
MCHC: 32.9 g/dL (ref 31.5–35.8)
MCV: 91.1 fL (ref 78.0–96.0)
MPV: 10.7 fL (ref 8.9–12.5)
Platelet Count: 354 10*3/uL — ABNORMAL HIGH (ref 142–346)
RBC: 2.47 10*6/uL — ABNORMAL LOW (ref 3.90–5.10)
RDW: 15 % (ref 11–15)
WBC: 13.86 10*3/uL — ABNORMAL HIGH (ref 3.10–9.50)
nRBC %: 0 /100 WBC (ref <=0.0)

## 2022-07-19 LAB — WHOLE BLOOD GLUCOSE POCT
Whole Blood Glucose POCT: 164 mg/dL — ABNORMAL HIGH (ref 70–100)
Whole Blood Glucose POCT: 140 mg/dL — ABNORMAL HIGH (ref 70–100)
Whole Blood Glucose POCT: 135 mg/dL — ABNORMAL HIGH (ref 70–100)
Whole Blood Glucose POCT: 146 mg/dL — ABNORMAL HIGH (ref 70–100)

## 2022-07-19 LAB — PHOSPHORUS: Phosphorus: 3.9 mg/dL (ref 2.3–4.7)

## 2022-07-19 LAB — MAGNESIUM: Magnesium: 1.9 mg/dL (ref 1.6–2.6)

## 2022-07-19 MED ORDER — HYDRALAZINE HCL 20 MG/ML IJ SOLN
10.0000 mg | Freq: Once | INTRAMUSCULAR | Status: DC
Start: 2022-07-19 — End: 2022-07-19

## 2022-07-19 MED ORDER — ACETAMINOPHEN 160 MG/5ML PO SUSP/SOLN (WRAP)
650.0000 mg | Freq: Four times a day (QID) | ORAL | Status: DC
Start: 2022-07-19 — End: 2022-08-18
  Administered 2022-07-19 – 2022-08-18 (×62): 650 mg via ORAL
  Filled 2022-07-19 (×63): qty 20.3

## 2022-07-19 MED ORDER — INSULIN LISPRO 100 UNIT/ML SOLN (WRAP)
1.0000 [IU] | Freq: Four times a day (QID) | Status: DC
Start: 2022-07-19 — End: 2022-07-30
  Administered 2022-07-26 – 2022-07-27 (×2): 1 [IU] via SUBCUTANEOUS
  Filled 2022-07-19 (×3): qty 3

## 2022-07-19 MED ORDER — HYDROMORPHONE HCL 2 MG PO TABS
1.0000 mg | ORAL_TABLET | Freq: Three times a day (TID) | ORAL | Status: DC | PRN
Start: 2022-07-19 — End: 2022-07-19
  Administered 2022-07-19: 1 mg via ORAL
  Filled 2022-07-19: qty 1

## 2022-07-19 MED ORDER — IOHEXOL 350 MG/ML IV SOLN
100.0000 mL | Freq: Once | INTRAVENOUS | Status: AC | PRN
Start: 2022-07-19 — End: 2022-07-19
  Administered 2022-07-19: 100 mL via INTRAVENOUS

## 2022-07-19 MED ORDER — SODIUM CHLORIDE 0.9 % IV BOLUS
100.0000 mL | INTRAVENOUS | Status: AC | PRN
Start: 2022-07-19 — End: 2022-07-20

## 2022-07-19 MED ORDER — CARVEDILOL 6.25 MG PO TABS
12.5000 mg | ORAL_TABLET | Freq: Two times a day (BID) | ORAL | Status: DC
Start: 2022-07-19 — End: 2022-08-11
  Administered 2022-07-19 – 2022-08-10 (×40): 12.5 mg via ORAL
  Filled 2022-07-19 (×3): qty 1
  Filled 2022-07-19: qty 2
  Filled 2022-07-19 (×12): qty 1
  Filled 2022-07-19: qty 2
  Filled 2022-07-19 (×10): qty 1
  Filled 2022-07-19 (×2): qty 2
  Filled 2022-07-19: qty 1
  Filled 2022-07-19: qty 2
  Filled 2022-07-19 (×2): qty 1
  Filled 2022-07-19: qty 2
  Filled 2022-07-19 (×7): qty 1
  Filled 2022-07-19: qty 2
  Filled 2022-07-19 (×3): qty 1

## 2022-07-19 MED ORDER — SODIUM CHLORIDE 0.9 % IV BOLUS
250.0000 mL | INTRAVENOUS | Status: AC | PRN
Start: 2022-07-19 — End: 2022-07-20

## 2022-07-19 MED ORDER — ACETAMINOPHEN 325 MG PO TABS
650.0000 mg | ORAL_TABLET | Freq: Four times a day (QID) | ORAL | Status: DC
Start: 2022-07-19 — End: 2022-08-18
  Administered 2022-07-19 – 2022-08-18 (×39): 650 mg via ORAL
  Filled 2022-07-19 (×42): qty 2

## 2022-07-19 MED ORDER — OXYCODONE HCL 5 MG PO TABS
2.5000 mg | ORAL_TABLET | Freq: Four times a day (QID) | ORAL | Status: DC | PRN
Start: 2022-07-19 — End: 2022-07-19

## 2022-07-19 MED ORDER — ALBUMIN HUMAN 25 % IV SOLN
100.0000 mL | INTRAVENOUS | Status: AC | PRN
Start: 2022-07-19 — End: 2022-07-20

## 2022-07-19 NOTE — PT Progress Note (Signed)
Physical Therapy Treatment  Shelby Barron  Post Acute Care Therapy Recommendations   Discharge Recommendations:  SNF    DME needs IF patient is discharging home: No additional equipment/DME recommended at this time    Therapy discharge recommendations may change with patient status.  Please refer to most recent note for up-to-date recommendations.    Unit: [facility] [facility]  Bed: A2715/A2715-01    ___________________________________________________    Time of treatment:  PT Received On: 07/19/22  Start Time: 1330  Stop Time: 1354  Time Calculation (min): 24 min  Total Treatment Time (min): 12 (cotreat w/ OT)    Chart Review and Collaboration with Care Team: 5 minutes, not included in above time.    PT Visit Number: 2    ___________________________________________________    Precautions:   Precautions  Weight Bearing Status: no restrictions  Aspiration Precautions: see SLP recommendations  Fall Risks: High, Impaired balance/gait, Impaired mobility, Mental status change, Muscle weakness  Seizure Precautions: Yes (comment date of last seizure)  Communication Precautions: Expressive aphasia, Receptive aphasia  Other Precautions: skin & pressure ulcer; SBP 120-160; L side flaccid    Personal Protective Equipment (PPE)  gloves and procedure mask    Updated X-Rays/Tests/Labs:  Lab Results   Component Value Date/Time    HGB 7.4 (L) 07/19/2022 02:16 AM    HGB 8.6 (L) 06/03/2022 04:23 AM    HCT 22.5 (L) 07/19/2022 02:16 AM    HCT 26.8 (L) 06/03/2022 04:23 AM    K 4.5 07/19/2022 02:16 AM    K 3.6 06/03/2022 04:23 AM    NA 139 07/19/2022 02:16 AM    NA 137 06/03/2022 04:23 AM    INR 1.1 07/14/2022 04:35 AM    INR 1.1 06/01/2022 03:15 AM    TROPI 9.6 05/26/2022 06:37 PM    TROPI -2.2 05/26/2022 06:37 PM    TROPI 11.8 05/26/2022 03:26 PM       All imaging reviewed, please see chart for details.      Subjective:  Pt appears agreeable to therapy, pt mostly non verbal.     Patient Goal: Pt  unable to participate in goal setting      Patient's medical condition is appropriate for Physical Therapy intervention at this time.  Patient is agreeable to participation in the therapy session. Nursing clears patient for therapy.      Objective:  Observation of Patient/Vital Signs:  Blood pressure 151/67, pulse 65, temperature 97.8 F (36.6 C), temperature source Oral, resp. rate (!) 33, height 1.575 m (5\' 2" ), weight 86.7 kg (191 lb 2.2 oz), SpO2 99%.      Cognition/Neuro Status  Arousal/Alertness: Inconsistent responses to stimuli  Attention Span: Difficulty dividing attention  Orientation Level: Disoriented X4  Memory: Unable to assess  Following Commands: Follows one step commands with repetition;Follows one step commands with increased time;25%  Safety Awareness: unable to assess  Insights: Not aware of deficits  Problem Solving: Unable to assess  Behavior: calm;flat affect  Motor Planning: decreased initiation;decreased processing speed  Coordination: GMC impaired;FMC impaired    Musculoskeletal Examination  Gross ROM  Right Upper Extremity ROM: within functional limits (PROM)  Left Upper Extremity ROM: within functional limits (PROM)  Right Lower Extremity ROM: within functional limits (PROM)  Left Lower Extremity ROM: within functional limits (PROM)        Gross Strength  Right Lower Extremity Strength:  (noted LAQ AROM in response to pain stimulus only)  Left Lower Extremity Strength:  (  pt spontaneously wiggling toes)      Functional Mobility  Supine to Sit:  (pt received in chair via hoyer)  Transfers  Bed to Chair: Dependent (hoyer lift)     Distance Walked (ft) (Step 6,7): 0 Feet       Neuro Re-Ed  Sitting Balance: with support;maximal assist    Educated the Patient to role of physical therapy, plan of care, goals of therapy and safety with mobility and ADLs with no indication of understanding.  Additional education required..    Patient left in bedside chair with alarm and all other medical  equipment in place and call bell and all personal items/needs within reach.  RN notified of session outcome. CM team and attending have been previously notified of d/c recommendation; no updates to d/c recommendation since that time.      Assessment:  Pt continues to display strength, balance, cognition and activity tolerance deficits. Pt received in chair via hoyer lift. Session performed w/ OT for safety and to maximize patient rehab potential. Pt very lethargic t/o session and frequently falling asleep. Pt would wake up when assisted to sit in upright position as opposed to reclined. Max A to perform sitting balance in chair 4x. PT applying pain stimuli to elicit response from RLE. Pt perform LAQ in response to pain stimuli. Further activity deferred 2/2 safety. Pt c/o inc pain in back when assisted to sit in upright position. Pt would continue to benefit from PT to improve functional mobility and prepare for safe d/c.      PMP Activity: Step 5 - Chair (hoyer lift)  Distance Walked (ft) (Step 6,7): 0 Feet    Plan:  Treatment/Interventions: Exercise, Gait training, Stair training, Neuromuscular re-education, Functional transfer training, LE strengthening/ROM, Endurance training, Cognitive reorientation, Patient/family training, Equipment eval/education, Bed mobility, Continued evaluation      PT Frequency: 2-3x/wk   Continue plan of care.    Goals:  Goals  Time for Goal Acheivement: By time of discharge  Pt Will Go Supine To Sit: to maximize functional mobility and independence, with moderate assist, Not met  Pt Will Sit Edge of Bed: 1-2 min, to maximize functional mobility and independence, with moderate assist, Not met  Pt Will Perform Sit to Stand: to maximize functional mobility and independence, with moderate assist, Not met  Pt Will Transfer Bed/Chair: with moderate assist, to maximize functional mobility and independence, Not met (hoyer lift 7/1)  Pt Will Ambulate: 31-50 feet, with rolling walker, with  moderate assist, to maximize functional mobility and independence, Not met      Nicoletta Dress, PT, DPT  07/19/2022  2:17 PM    Physical Therapist  Physical Medicine and Rehabilitation  [place], [place], Thurs 8-4:30pm  [place]ica Mote Fri 12:30-9:00pm  [id number]      [facility]  Patient: Shelby Barron MRN#: [id number]  Unit: [place] [facility] Bed: A2715/A2715-01

## 2022-07-19 NOTE — Progress Notes (Signed)
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 s/p bilateral radical nephrectomies and  cystectomy on 06/28/22  - complicated by small bowel enterotomy and blood loss   New onset afib - on amiodarone  High grade urothelial bladder CA: s/p bilateral nephrectomies and cystectomy c/b mall bowel enterotomy and blood loss (~1L) on 06/28/22  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 - remains low, adequate iron  Encephalopathy - persists  Leukocytosis - improving - on meropenem and micafungin  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   Hypophosphatemia - resolved    Recommendations:   HD tomorrow    Case discussed with: daughter    Debara Pickett, MD  [facility]  [phone] (office)  X [phone] (FFX Spectra-Link)      Subjective: sitting up in chair  NGT in place      Review of Systems:   Unable to assess, patient is confused     Physical Exam:     Vitals:    07/19/22 0610 07/19/22 0705 07/19/22 0800 07/19/22 0840   BP: 133/87 144/65  144/74   Pulse: 73 66  (!) 59   Resp: (!) 30 15     Temp:   97.8 F (36.6 C)    TempSrc:   Oral    SpO2: 98% 100%     Weight:       Height:           Intake and Output Summary (Last 24 hours) at Date Time    Intake/Output Summary (Last 24 hours) at 07/19/2022 1349  Last data filed at 07/19/2022 0600  Gross per 24 hour   Intake 680 ml   Output 0 ml   Net 680 ml         General: confused, on NC, NGT in place; chronically ill appearing  Cardiovascular: regular rate and  rhythm  Lungs: bilateral air entry  Abdomen: soft  Extremities: LUE edema  Access: R permacath   Meds:      Scheduled Meds: PRN Meds:    acetaminophen, 650 mg, Oral, 4 times per day   Or  acetaminophen, 650 mg, Oral, 4 times per day  amiodarone, 200 mg, per NG tube, Daily  aspirin, 81 mg, per NG tube, Daily  carvedilol, 12.5 mg, Oral, Q12H SCH  darbepoetin alfa, 0.45 mcg/kg, Subcutaneous, Weekly  glycopyrrolate, 0.2 mg, Intravenous, Q8H  heparin (porcine), 5,000 Units, Subcutaneous, Q12H SCH  insulin glargine, 7 Units, Subcutaneous, QAM  insulin lispro, 1-8 Units, Subcutaneous, Q6H  meropenem, 500 mg, Intravenous, Q24H  micafungin, 100 mg, Intravenous, Q24H  pantoprazole, 40 mg, Intravenous, BID  thiamine, 100 mg, Intravenous, Q24H SCH          Continuous Infusions:       acetaminophen, 650 mg, Q6H PRN  albuterol-ipratropium, 3 mL, Q6H PRN  benzocaine, 1 spray, TID PRN  dextrose, 15 g of glucose, PRN   Or  dextrose, 12.5  g, PRN   Or  dextrose, 12.5 g, PRN   Or  glucagon (rDNA), 1 mg, PRN  diphenhydrAMINE, 25 mg, Q6H PRN  hydrALAZINE, 10 mg, Q3H PRN  labetalol, 10 mg, Q15 Min PRN  lidocaine 2% jelly, , Once PRN  naloxone, 0.2 mg, PRN  phenol, 1 spray, Q2H PRN              Labs:     Recent Labs   Lab 07/19/22  0216 07/18/22  0421 07/17/22  0431   WBC 13.86* 14.15* 15.03*   Hemoglobin 7.4* 7.8* 7.9*   Hematocrit 22.5* 24.5* 24.8*   Platelet Count 354* 323 286     Recent Labs   Lab 07/19/22  0216 07/18/22  0421 07/17/22  0431 07/16/22  0244 07/15/22  0427 07/14/22  0435   Sodium 139 139 135 134*  More results in Results Review 138   Potassium 4.5 3.7 3.8 3.4*  More results in Results Review 3.4*   Chloride 102 101 98* 98*  More results in Results Review 100   CO2 23 25 23 27   More results in Results Review 27   BUN 45* 31* 51* 32*  More results in Results Review 24*   Creatinine 3.9* 2.8* 3.7* 2.5*  More results in Results Review 2.4*   Calcium 9.0 8.9 8.6 8.2  More results in Results Review 8.4   Albumin  --  1.8*  --   1.8*  --  1.8*   Phosphorus 3.9 2.4 3.5 2.1*  More results in Results Review 2.9   Magnesium 1.9 1.8 1.7 1.7  More results in Results Review 1.8   Glucose 196* 185* 164* 166*  More results in Results Review 116*   GFR 10.8* 16.1* 11.5* 18.4*  More results in Results Review 19.3*   More results in Results Review = values in this interval not displayed.           Signed by: Debara Pickett, MD, FASN

## 2022-07-19 NOTE — Progress Notes (Signed)
Wound Ostomy Continence Progress Note  Date Time: 07/19/22 12:29 PM  Patient Name: PENLEY, LEGREE  Consulting Service: [place] [facility] Day: 22      Assessment   Pertinent skin and/or ostomy history includes   Acid reflux, Asthma, well controlled, Cancer of kidney, Chronic lower back pain, Congestive heart disease, COPD (chronic obstructive pulmonary disease), CVA (cerebral infarction) (01/12/2003), Diabetes, Diabetic nephropathy, Fibroids, Gout, H/O: gout, Hyperlipidemia, Hypertension, Neuropathy of hand, and SOB (shortness of breath)..  Admitted with Bladder cancer.      Assessment:     Wound Type and Location:  Surgical Wound Mid Abdomen  Dimensions: 27 cm x 6 cm x 6 cm  Wound Bed:  Full thickness tissue loss with beefy red granulation tissue along wound edges. Granulation tissue 50% Nongranulation tissue 20% Slough 30%   Undermining:no   Tunneling: no  Peri Wound/ Edges:macerated and denuded between 6 and 9 O'clock peri wound, noted some hyperpigmentation along wound edge, partial thickness skin loss.  Noted increased amount of slough (see wound picture)   Drainage:  brownish serosanguinous  Pain:  yes gets prn med  Odor:  none    Daughter at bedside, Dr. Sedalia Muta at bedside spoke at length regarding pain medication.  Orders given for a dose of pain medication during wound vac changes.    Wound care to mid abdomen/ Vac Dressing Change     Area cleansed with Vashe wash (soaked for 5-10 minutes ) and  patted dry.    Applied  No-Sting skin barrier film to peri-wound and allowed to dry.   Placed Vac drape around peri-wound window paned style, to prevent maceration and ap[plied Eakin's ring around umbilicus, top of wound and along the pannus and  curvature of pannus for more adherence.     Medical Adhesive Related Skin Injury  (MARSI)- Patient has partial thickness skin loss,  peri wound from vac drape between 6 and 9 O'clock peri wound.  Noted that peri wound area around wound is worsening.  Applied Plurogel  directly to wound bed after cleansing and covered with silicone dressing.  This was topped with vac drape.      Packed two pieces of silver non adherent dressing and top with  two pieces of white foam and 3 pieces of of black foam into a wound  Secured with vac drape.   A quarter size cut hole was cut into the black foam to fit the trackpad.   Good seal obtained and wound vacuum setting at 125 mm Hg, low intensity continuous.  An aseptic technique utilized seemed to tolerate it well with pain medication.         Vashe Wound Solution helps to cleanse the wound and accomplish the goals of wound bed preparation in a biocompatible, safe, effective, and natural way. This solution of hypochlorous acid (HOCl) acts as a preservative that inhibits microbial contamination within the solution. Vashe Wound Solution has the highest concentration of pure HOCl.    Vashe comes from supply rooms or central supply.       Plurogel- contains a non -cytotoxic surfactant that aids in maintaining a moist wound healing environment which promotes autolytic debridement. Indicated for use on a partial thickness and full thickness lightly to moderately draining wounds, 1st, and second-degree burn.     Plurogel is in the wound care office.     Moisture Associated Skin Damage- staff reported skin tear. Partial thickness skin loss to Left buttock.  Recommend use of Triad to site.  Partial thickness skin loss reported ( did not visualize) patient in great deal of pain after vac application and with pain med. Recommend treatment per policy .  Wound care:  1. Clean wound with wound cleanser or incontinence wipes.  2. Apply a layer of Triad (thickness of a dime) directly on the wound. If Triad not sticking to wound bed, pat to dry and reapply.  3. Keep open to air.    Triad Hydrophilic Wound Dressing is a Zinc-oxide based hydrophilic paste for light-to- moderate levels of wound exudates. Helps maintain an optimal wound healing environment to  facilitate natural autolytic debridement. Ideal alternative for difficult-to-dress areas and varying wound etiologies.    Triad comes from the supply rooms or central supply.   Plan   Plan:     Wound Photography: Moisture Associated Skin Damage- Left Buttock     Medical Adhesive Related Skin Injury  (MARSI)       *Did not take picture of abdominal wound  with this vac  change please see wound care description (wound soaking with Vashe wash above)    Objective Findings   Populated from Epic Documentation:  Specialty Bed: Progressa mattress - Armed forces operational officer, incontinence management systems, and StayInPlace technology to address the five factors of skin breakdown - pressure, shear, friction, heat, and moisture - for optimal wound healing and skin protection.   Braden: Braden Scale Score: 11 (07/18/22 2000)  Braden Subscales:  Sensory Perceptions: Very limited (07/18/22 2000)  Moisture: Very moist (07/18/22 2000)  Activity: Bedfast (07/18/22 2000)  Mobility: Very limited (07/18/22 2000)  Nutrition: Probably inadequate (07/18/22 2000)  Friction and Shear: Potential problem (07/18/22 2000)    Ht Readings from Last 1 Encounters:   07/18/22 1.575 m (5\' 2" )     Wt Readings from Last 3 Encounters:   07/19/22 86.7 kg (191 lb 2.2 oz)   06/25/22 87.1 kg (192 lb)   06/23/22 84.8 kg (187 lb)     Body mass index is 34.96 kg/m.    Current Diet:   Supervise For Meals Frequency: All meals  Tube feeding-Continuous  Diet NPO effective now     History of Present Illness   This is a 82 y.o. female  has a past medical history of Acid reflux, Asthma, well controlled, Cancer of kidney, Chronic lower back pain, Congestive heart disease, COPD (chronic obstructive pulmonary disease), CVA (cerebral infarction) (01/12/2003), Diabetes, Diabetic nephropathy, Fibroids, Gout, H/O: gout, Hyperlipidemia, Hypertension, Neuropathy of hand, and SOB (shortness of breath)..  Admitted with Bladder cancer.      Past Surgical  History:   Procedure Laterality Date    APPENDECTOMY (OPEN)      CLOSURE, ENTEROTOMY, SMALL INTESTINE  06/28/2022    Procedure: CLOSURE, ENTEROTOMY, SMALL INTESTINE;  Surgeon: Marlaine Hind, MD;  Location: [facility];  Service: General;;    CYSTECTOMY, ILEOCONDUIT N/A 06/28/2022    Procedure: CYSTECTOMY, RADICAL;  Surgeon: Neldon Newport, MD;  Location: [facility];  Service: Urology;  Laterality: N/A;    DRAIN (OTHER) N/A 05/31/2022    Procedure: DRAIN (OTHER);  Surgeon: Hope Pigeon, MD;  Location: [facility];  Service: Interventional Radiology;  Laterality: N/A;    EXPLORATORY LAPAROTOMY N/A 06/30/2022    Procedure: SMALL BOWEL ANASTOMOSIS, CLOSURE OF MESENTERIC DEFECT, CLOSURE OF ABDOMINAL WALL;  Surgeon: Marlaine Hind, MD;  Location: [facility];  Service: General;  Laterality: N/A;  **IP-2910.01/ MD AVAIL [id number] OR AFTER 1600**    EXPLORATORY LAPAROTOMY, RESECTION SMALL BOWEL  N/A 06/28/2022    Procedure: RESECTION SMALL BOWEL;  Surgeon: Marlaine Hind, MD;  Location: [facility];  Service: General;  Laterality: N/A;    HERNIA REPAIR      HYSTERECTOMY      LAPAROTOMY, NEPHRECTOMY Bilateral 06/28/2022    Procedure: BILATERAL OPEN RADICAL NEPHRECTOMY AND LYSIS OF ASHESION;  Surgeon: Neldon Newport, MD;  Location: [facility];  Service: Urology;  Laterality: Bilateral;    PARTIAL NEPHRECTOMY      tumor removed from kidney  cJanuary 2015    TUNNELED CATH PLACEMENT (PERMCATH) N/A 06/01/2022    Procedure: TUNNELED CATH PLACEMENT;  Surgeon: Suszanne Finch, MD;  [facility]: [facility];  Service: Interventional Radiology;  Laterality: N/A;    TUNNELED CATH PLACEMENT (PERMCATH) Right 07/14/2022    Procedure: TUNNELED CATH PLACEMENT;  Surgeon: Hope Pigeon, MD;  [facility]: [facility];  Service: Interventional Radiology;  Laterality: Right;  right IJ tunneled HD catheter placement    TUNNELED CATH REMOVAL (PERMCATH) Right 07/14/2022    Procedure: TUNNELED CATH REMOVAL;  Surgeon: Hope Pigeon, MD;   [facility]: [facility];  Service: Interventional Radiology;  Laterality: Right;  right neck/chest tunneled HD catheter removal    TURBT      WOUND VAC APPLICATION N/A 06/28/2022    Procedure: Tawni Pummel WOUND VAC APPLICATION;  Surgeon: Marlaine Hind, MD;  Location: [facility];  Service: General;  Laterality: N/A;    WOUND VAC APPLICATION N/A 06/30/2022    Procedure: WOUND VAC APPLICATION;  Surgeon: Marlaine Hind, MD;  [facility]: [facility];  Service: General;  Laterality: N/A;       Procedure(s) with comments:  TUNNELED CATH REMOVAL - right neck/chest tunneled HD catheter removal    21 Days Post-Op  -------------------  **Canceled**    Procedure(s) with comments:  TUNNELED CATH REMOVAL - right neck/chest tunneled HD catheter removal    * No surgery date entered *  -------------------    Procedure(s) with comments:  TUNNELED CATH REMOVAL - right neck/chest tunneled HD catheter removal    19 Days Post-Op  -------------------    Procedure(s) with comments:  TUNNELED CATH REMOVAL - right neck/chest tunneled HD catheter removal    5 Days Post-Op  -------------------    Procedure(s) with comments:  TUNNELED CATH REMOVAL - right neck/chest tunneled HD catheter removal    5 Days Post-Op  -------------------      Lab   Significant Lab Values:  Recent Labs     07/19/22  0216   WBC 13.86*   RBC 2.47*   Hemoglobin 7.4*   Hematocrit 22.5*   Sodium 139   Potassium 4.5   Chloride 102   CO2 23   BUN 45*   Creatinine 3.9*   Calcium 9.0   GFR 10.8*   Glucose 196*           Genae Strine "Pepper" Hazley Dezeeuw  MSN, RN, WOCN  [facility]  Wound, Ostomy, and  Continence Coordinator  [phone] Office  [phone] Spectralinks  [email or web address]

## 2022-07-19 NOTE — Plan of Care (Signed)
Major shift events:   - Downgraded from ICU.   - Up to chair with mechanical lift.   - Seen by OT/PT/SLP.     Review of Systems:     NEURO: Q4h neuro checks. GCS 13 (E4, M4, V5). Drowsy/spontaneous eye movements. Oriented to person, able to state correct city once. Seems to be more oriented and talkative when engaging with caregiver Jerrye Beavers,  Sluggish pupils bilaterally upon admission. Unable to follow commands. NIH 25. R side gaze preference.      RUE - 2/5  LUE - 1/5  RLE - 1/5  LLE -2/5    One time dose of Dilaudid 1mg  tab given prior to wound vac dressing changes.   Tylenol scheduled q6h.    Delirium precautions in place.      CV: NSR with frequent PACs, HR 60-70s. SBP goal 120-160. SBP 120-180s. Elevated SBP when in pain during wound care. +1 pitting edema BUE. Palpable pulses.      RESP: On room air, clear/diminished lung sounds. spO2=91-95%.      GI: NPO. NDT @ 78, R nare. LBM 07/19/22.      GU: Anuric. HD Tu, Thurs, Sun.     Skin: Wound vac abdomen. Output=63mL     IV Access: LUE midline.            Problem: Moderate/High Fall Risk Score >5  Goal: Patient will remain free of falls  Outcome: Progressing  Flowsheets (Taken 07/19/2022 0800)  High (Greater than 13):   HIGH-Visual cue at entrance to patient's room   HIGH-Bed alarm on at all times while patient in bed   HIGH-Utilize chair pad alarm for patient while in the chair   HIGH-Apply yellow "Fall Risk" arm band   HIGH-Pharmacy to initiate evaluation and intervention per protocol   HIGH-Initiate use of floor mats as appropriate     Problem: Neurological Deficit  Goal: Neurological status is stable or improving  Outcome: Progressing  Flowsheets (Taken 07/19/2022 2016)  Neurological status is stable or improving:   Monitor/assess NIH Stroke Scale   Re-assess NIH Stroke Scale for any change in status   Monitor/assess/document neurological assessment (Stroke: every 4 hours)   Observe for seizure activity and initiate seizure precautions if indicated   Perform CAM  Assessment     Problem: Every Day - Stroke  Goal: Mobility/Activity is maintained at optimal level for patient  Outcome: Progressing  Flowsheets (Taken 07/19/2022 2016)  Mobility/activity is maintained at optimal level for patient:   Encourage independent activity per ability   Consult/collaborate with Physical Therapy and/or Occupational Therapy      Problem: Renal Instability  Goal: Fluid and electrolyte balance are achieved/maintained  Outcome: Progressing  Flowsheets (Taken 07/19/2022 2016)  Fluid and electrolyte balance are achieved/maintained:   Monitor/assess lab values and report abnormal values   Assess and reassess fluid and electrolyte status   Observe for cardiac arrhythmias   Monitor for muscle weakness   Follow fluid restrictions/IV/PO parameters     Problem: Patient Receiving Advanced Renal Therapies  Goal: Therapy access site remains intact  Outcome: Progressing  Flowsheets (Taken 07/19/2022 2016)  Therapy access site remains intact:   Assess therapy access site   Change therapy access site dressing as needed     Problem: Inadequate Airway Clearance  Goal: Patent Airway maintained  Outcome: Progressing  Flowsheets (Taken 07/18/2022 1959)  Patent airway maintained:   Position patient for maximum ventilatory efficiency   Provide adequate fluid intake to liquefy secretions   Suction  secretions as needed   Reinforce use of ordered respiratory interventions (i.e. CPAP, BiPAP, Incentive Spirometer, Acapella, etc.)   Reposition patient every 2 hours and as needed unless able to self-reposition     Problem: Inadequate Airway Clearance  Goal: Normal respiratory rate/effort achieved/maintained  Outcome: Progressing  Flowsheets (Taken 07/19/2022 2016)  Normal respiratory rate/effort achieved/maintained: Plan activities to conserve energy: plan rest periods     Problem: Infection Prevention  Goal: Free from infection  Outcome: Progressing  Flowsheets (Taken 07/18/2022 1959)  Free from infection:   Monitor/assess vital  signs   Encourage/assist patient to turn, cough and perform deep breathing every 2 hours   Assess incision for evidence of healing   Monitor/assess output from surgical drain if present   Assess for signs and symptoms of infection   Monitor/assess lab values and report abnormal values     Problem: Impaired Mobility  Goal: Mobility/Activity is maintained at optimal level for patient  Outcome: Progressing  Flowsheets (Taken 07/19/2022 2016)  Mobility/activity is maintained at optimal level for patient:   Encourage independent activity per ability   Consult/collaborate with Physical Therapy and/or Occupational Therapy     Problem: Pain interferes with ability to perform ADL  Goal: Pain at adequate level as identified by patient  Outcome: Progressing  Flowsheets (Taken 07/19/2022 2016)  Pain at adequate level as identified by patient:   Identify patient comfort function goal   Assess for risk of opioid induced respiratory depression, including snoring/sleep apnea. Alert healthcare team of risk factors identified.   Assess pain on admission, during daily assessment and/or before any "as needed" intervention(s)   Reassess pain within 30-60 minutes of any procedure/intervention, per Pain Assessment, Intervention, Reassessment (AIR) Cycle   Evaluate if patient comfort function goal is met   Consult/collaborate with Physical Therapy, Occupational Therapy, and/or Speech Therapy

## 2022-07-19 NOTE — UM Notes (Signed)
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 Patient past medical COPD, [name], type 2 diabetes, renal cell carcinoma status post partial bilateral nephrectomy, [name], new diagnosis of urothelial cancer.  She was recently admitted in May 2024 at which time she was transitioned from PD to HD.  At that time was diagnosed with urothelial cancer.  Patient presented for scheduled cystectomy and completion nephrectomy with urology. [name], [name], extubated and off pressors since 6/13. Patient is hypoactive and delirious.       Vital Signs  Temp 100.6  HR 103  RR 29  BP 115/98  SPO2 100% on RA    Abnormal Labs   Latest Reference Range & Units 07/18/22 04:21   WBC 3.10 - 9.50 x10 3/uL 14.15 (H)   Hemoglobin 11.4 - 14.8 g/dL 7.8 (L)   Hematocrit 40.9 - 43.7 % 24.5 (L)   Platelet Count 142 - 346 x10 3/uL 323   RBC 3.90 - 5.10 x10 6/uL 2.66 (L)   Instrument Absolute Neutrophil Count 1.10 - 6.33 x10 3/uL 11.03 (H)   Monocytes Absolute Automated 0.21 - 0.85 x10 3/uL 1.32 (H)   Immature Granulocytes Absolute 0.00 - 0.07 x10 3/uL 0.25 (H)   Neutrophils Absolute 1.10 - 6.33 x10 3/uL 11.03 (H)   Glucose 70 - 100 mg/dL 811 (H)   BUN 7 - 21 mg/dL 31 (H)   Creatinine 0.4 - 1.0 mg/dL 2.8 (H)   EGFR >=91.4 NW/GNF/6.21 m2 16.1 (L)   Alkaline Phosphatase 37 - 117 U/L 122 (H)   Albumin 3.5 - 5.0 g/dL 1.8 (L)   Protein Total 6.0 - 8.3 g/dL 4.9 (L)   Albumin/Globulin Ratio 0.9 - 2.2  0.6 (L)   Iron 32 - 157 ug/dL 18 (L)   TIBC 308 - 657 ug/dL 99  (L)   UIBC 846 - 962 ug/dL 81 (L)   Ferritin 9.52 - 204.00 ng/mL 2,300.21 (H)     Imaging  MRI Brain WO Contrast    Result Date: 07/18/2022   Interval evolution of multifocal recent infarcts in the bilateral cerebral hemispheres. No new infarct. Milan Waunita Schooner, MD 07/18/2022 5:57 PM      Plan of Care  Per Critical Care  NEURO:   -Acute changes in neurological status: Delirium versus other underlying etiology  - Recent watershed stroke  - History of [name]  Discussed with neurology:  - Spot EEG  - MRI brain without contrast  - Neurocheck every 4 hours  - Continue with aspirin, may be able to start systemic anticoagulation on July 5 [10 days post initial diagnosis of stroke as recommended by neurology team]     CV:   Blood pressure goal: MAP above 65  -Paroxysmal A-fib: Currently rate controlled, continue with amiodarone and beta-blocker     PULM:   Maintaining adequate oxygenation and ventilation, currently protecting airway  ABG showing some respiratory alkalosis, could be secondary to Nash- Stokes pattern breathing which may be related  to her neurological status     GI:   GI PPX: Pantoprazole  Nutrition: Advance tube feeding as tolerated     RENAL:   -S/P Cystectomy and completion nephrectomy complicated by small bowel enterotomy   Appreciate nephrology, on hemodialysis 3 times a week TTS via newly inserted permacath on June 26     ID:   -Peritonitis and possible intra-abdominal fluid collection status post iatrogenic small bowel enterotomy:  WBC trending down, Tmax 100.6, abdominal exam is benign, wound VAC remains in place,  Appreciate infectious disease recommendation  Will continue with meropenem 500 mg IV daily as well as micafungin 100 mg IV daily  -As recommended by infectious disease team, will arrange for CT abdomen pelvis with contrast after premedication     HEME:   SCD's  Pharmacologic ppx: Subcu heparin     ENDO/RHEUM:   Glucose: goal 100 -180  Lantus and sliding scale     Code Status:  Full Code  Patient is currently not able to make medical decisions.       Per ID  Continue meropenem and micafungin  Will add a single dose of vancomycin  Blood cxs today  CT of abdomen, in coordination with renal.  May need to pre-medicate, due to prior h/o dye intolerance  Goals of care may need to be updated    Per Nephrology  HD Tuesday  Prolonged hospitalization with significant decline. Prognosis appears poor  Iron sat low; if ferritin below 600, the IV iron  Started aranesp     Medications  Current Facility-Administered Medications   Medication Dose Route Frequency Last Rate Last Admin    acetaminophen (TYLENOL) tablet 650 mg  650 mg Oral 4 times per day        Or    acetaminophen (TYLENOL) 160 MG/5ML oral liquid 650 mg  650 mg Oral 4 times per day        acetaminophen (TYLENOL) tablet 650 mg  650 mg Oral Q6H PRN   650 mg at 07/19/22 0940    albuterol-ipratropium (DUO-NEB) 2.5-0.5(3) mg/3 mL nebulizer 3 mL  3 mL Nebulization Q6H PRN        amiodarone (PACERONE) tablet 200 mg  200 mg per NG tube Daily   200 mg at 07/18/22 1613    aspirin chewable tablet 81 mg  81 mg per NG tube Daily   81 mg at 07/19/22 0839    benzocaine (HURRICAINE) 20 % mouth spray 1 spray  1 spray Mouth/Throat TID PRN   1 spray at 07/04/22 1102    carvedilol (COREG) tablet 12.5 mg  12.5 mg Oral Q12H SCH        darbepoetin alfa (ARANESP) injection 40 mcg  0.45 mcg/kg Subcutaneous Weekly   40 mcg at 07/17/22 1827    dextrose (GLUCOSE) 40 % oral gel 15 g of glucose  15 g of glucose Oral PRN        Or    dextrose (D10W) 10% bolus 125 mL  12.5 g Intravenous PRN        Or    dextrose 50 % bolus 12.5 g  12.5 g Intravenous PRN        Or    glucagon (rDNA) (GLUCAGEN) injection 1 mg  1 mg Intramuscular PRN        diphenhydrAMINE (BENADRYL) injection 25 mg  25 mg Intravenous Q6H PRN        glycopyrrolate (ROBINUL) injection 0.2 mg  0.2 mg Intravenous Q8H   0.2  mg at 07/19/22 0434    heparin (porcine) injection 5,000 Units  5,000 Units Subcutaneous  Q12H SCH   5,000 Units at 07/19/22 0839    hydrALAZINE (APRESOLINE) injection 10 mg  10 mg Intravenous Q3H PRN   10 mg at 07/18/22 2331    insulin glargine (LANTUS) injection 7 Units  7 Units Subcutaneous QAM   7 Units at 07/19/22 0840    insulin lispro injection 1-8 Units  1-8 Units Subcutaneous Q6H        labetalol (NORMODYNE,TRANDATE) injection 10 mg  10 mg Intravenous Q15 Min PRN   10 mg at 07/19/22 0032    lidocaine 2% urethral/mucosal jelly   Each Nare Once PRN        meropenem (MERREM) 500 mg in sodium chloride 0.9 % 100 mL mini-bag plus  500 mg Intravenous Q24H   Stopped at 07/18/22 1921    micafungin (MYCAMINE) 100 mg in sodium chloride 0.9 % 100 mL IVPB  100 mg Intravenous Q24H   Stopped at 07/18/22 2203    naloxone (NARCAN) injection 0.2 mg  0.2 mg Intravenous PRN        pantoprazole (PROTONIX) injection 40 mg  40 mg Intravenous BID   40 mg at 07/19/22 0841    phenol (CHLORASEPTIC) liquid 1 spray  1 spray Oral Q2H PRN        thiamine (VITAMIN B-1) injection 100 mg  100 mg Intravenous Q24H SCH   100 mg at 07/19/22 0940       Primary Coverage:  Payor: MEDICARE [employer] / Plan: [employer] MED ADV 13086 / Product Type: MANAGED MEDICARE /     UTILIZATION REVIEW CONTACT: Wendall Stade RN, BSN  Utilization Review   [facility]  [phone]   [phone]  Email: [email or web address]  Tax ID:  [id number]         NOTES TO REVIEWER:    This clinical review is based on/compiled from documentation provided by the treatment team within the patient's medical record.

## 2022-07-19 NOTE — Progress Notes (Signed)
GENERAL SURGERY PROGRESS NOTE  [facility]    Date Time: 07/19/22 7:26 PM  Patient Name: [facility] Day: 21    ASSESSMENT:     Patient Active Problem List   Diagnosis    Type 2 diabetes mellitus with kidney complication, with long-term current use of insulin    Diabetes mellitus type II, uncontrolled    Neuropathy of hand    Left renal mass    Hamstring injury    Peritonitis    ESRD (end stage renal disease)    Chronic diastolic congestive heart failure    Aortic atherosclerosis    Malignant neoplasm of urinary bladder, unspecified site    Malignant tumor of kidney, left    Benign essential hypertension    GERD (gastroesophageal reflux disease)    Leukocytosis    Bladder cancer    Anemia    COPD (chronic obstructive pulmonary disease)     Patient s/p open cystectomy, bilateral nephrectomies, and resection of small bowel after intra-op enterotomy. Currently in the ICU for worsening mental status. Upon review of imaging, low concern for intra-abdominal process / anastomotic leak given prior study with oral contrast showing no extravasation. Low concern for abscesses - would expect these to have a more loculated appearance in the interim between 6/17 and now.    PLAN:   -- Low concern for intra-abdominal pathology - no surgical intervention indicated for small bowel anastomosis.    Patient seen and discussed with Dr. Ulyess Mort.    Kinnie Scales. Deno Etienne, MD  PGY-4 Surgery    Attending Note:    I, Marlaine Hind, MD, have personally seen and examined this patient and have participated in their care. I agree with all clinical information, including the physical exam, patient history, and planning as documented by the physician assistant/resident. All notes, labs, and imaging were reviewed.  The patient was seen on the date the note was written      Marlaine Hind, MD  [facility]  [phone]     SUBJECTIVE:   Imaging obtained last night concerning for increased intra-abdominal fluid  collections.     Objective:   Current Vitals:   Vitals:    07/19/22 1900   BP: 122/59   Pulse: 74   Resp:    Temp:    SpO2: 98%       Intake and Output Summary (Last 24 hours):  I/O last 3 completed shifts:  In: 980 [IV Piggyback:980]  Out: 0     Labs:     Results       Procedure Component Value Units Date/Time    Whole Blood Glucose POCT [[id number]]  (Abnormal) Collected: 07/19/22 1414    Specimen: Blood, Capillary Updated: 07/19/22 1417     Whole Blood Glucose POCT 135 mg/dL     Fungal Identification, Molds [[id number]] Collected: 07/04/22 1034    Specimen: Sputum, Suctioned Updated: 07/19/22 0948     Scanned Result See Scanned Result    Whole Blood Glucose POCT [[id number]]  (Abnormal) Collected: 07/19/22 0749    Specimen: Blood, Capillary Updated: 07/19/22 0751     Whole Blood Glucose POCT 140 mg/dL     Whole Blood Glucose POCT [[id number]]  (Abnormal) Collected: 07/19/22 0351    Specimen: Blood, Capillary Updated: 07/19/22 0352     Whole Blood Glucose POCT 164 mg/dL     Magnesium [[id number]]  (Normal) Collected: 07/19/22 0216    Specimen: Blood, Venous Updated: 07/19/22 0243  Magnesium 1.9 mg/dL     Basic Metabolic Panel [[id number]]  (Abnormal) Collected: 07/19/22 0216    Specimen: Blood, Venous Updated: 07/19/22 0243     Glucose 196 mg/dL      BUN 45 mg/dL      Creatinine 3.9 mg/dL      Calcium 9.0 mg/dL      Sodium 782 mEq/L      Potassium 4.5 mEq/L      Chloride 102 mEq/L      CO2 23 mEq/L      Anion Gap 14.0     GFR 10.8 mL/min/1.73 m2     Phosphorus [[id number]]  (Normal) Collected: 07/19/22 0216    Specimen: Blood, Venous Updated: 07/19/22 0243     Phosphorus 3.9 mg/dL     CBC without Differential [[id number]]  (Abnormal) Collected: 07/19/22 0216    Specimen: Blood, Venous Updated: 07/19/22 0228     WBC 13.86 x10 3/uL      Hemoglobin 7.4 g/dL      Hematocrit 52.8 %      Platelet Count 354 x10 3/uL      MPV 10.7 fL      RBC 2.47 x10 6/uL      MCV 91.1 fL      MCH 30.0 pg      MCHC 32.9 g/dL      RDW 15 %       nRBC % 0.0 /100 WBC      Absolute nRBC 0.00 x10 3/uL     Whole Blood Glucose POCT [[id number]]  (Abnormal) Collected: 07/18/22 2342    Specimen: Blood, Capillary Updated: 07/18/22 2349     Whole Blood Glucose POCT 166 mg/dL     Whole Blood Glucose POCT [[id number]]  (Abnormal) Collected: 07/18/22 1958    Specimen: Blood, Capillary Updated: 07/18/22 2010     Whole Blood Glucose POCT 146 mg/dL             Rads:     Radiology Results (24 Hour)       Procedure Component Value Units Date/Time    CT Abdomen Pelvis W IV And PO Cont [[id number]] Collected: 07/19/22 0618    Order Status: Completed Updated: 07/19/22 6387    Narrative:      HISTORY: History of diabetes, appendectomy, hysterectomy, bilateral  nephrectomy, cystectomy. Evaluate for infection.    COMPARISON: None available.    TECHNIQUE: CT abdomen and pelvis WITH intravenous contrast. Oral contrast  was administered. The following dose reduction techniques were utilized:  Automated exposure control and/or adjustment of the mA and/or kV according  to patient size, and the use of iterative reconstruction technique.    CONTRAST: iohexol (OMNIPAQUE) 350 MG/ML injection 100 mL.    FINDINGS: Coronary artery calcifications are noted. The lung bases show no  pneumonia. There is a small paraesophageal type hiatal hernia. A small  amount of water density upper abdominal free fluid is again noted. The  liver, spleen, pancreas and adrenal glands show no mass. Both kidneys have  been removed. There is no sign of abdominal hemorrhage. An enlarging  retroperitoneal fluid collection on the left extends into a left posterior  lumbar hernia. On axial image 52 series 2, this retroperitoneal fluid  collection measures 14.2 x 3.9 cm. No retroperitoneal hemorrhage or  significant right-sided retroperitoneal fluid collection is seen.    The gallbladder and bile ducts are unremarkable. The portal vein, SMV and  other central mesenteric veins are patent. There is no CT evidence  of  pancreatitis. The abdominal aorta is calcified but not aneurysmal. No  adenopathy. There is no retroperitoneal adenopathy or mass.     Sigmoid and left colonic diverticulosis are noted without CT evidence of  diverticulitis. There is no bowel dilatation or definite wall thickening.  Corpak catheter has tip in the second duodenal segment. No omental or  mesenteric abnormality is seen. There is no free intraperitoneal air.    The uterus has been removed. There is no appreciable adnexal mass. There is  no pelvic sidewall adenopathy or other definite pelvic mass. The urinary  bladder is unremarkable.    Mild induration of the subcutaneous tissues of the abdominal wall is noted.  The abdominal wall soft tissues are otherwise unremarkable. No acute  appearing bony abnormalities are identified. Spinal degenerative changes  are noted with spinal stenosis. No neoplastic bone lesions are identified.      Impression:           1.Slight interval increase in left-sided retroperitoneal fluid collection  with the other abdominal fluid collections appearing relatively stable.   2. There is no definite abscess or acute inflammatory process.  3. Incidental note is made of a small paraesophageal type hernia.  4. Sigmoid diverticulosis is noted without CT evidence of diverticulitis.   5. Other findings as noted above.        Miguel Dibble, MD  07/19/2022 6:36 AM              Medications:     Current Facility-Administered Medications   Medication Dose Route Frequency    acetaminophen  650 mg Oral 4 times per day    Or    acetaminophen  650 mg Oral 4 times per day    amiodarone  200 mg per NG tube Daily    aspirin  81 mg per NG tube Daily    carvedilol  12.5 mg Oral Q12H SCH    darbepoetin alfa  0.45 mcg/kg Subcutaneous Weekly    glycopyrrolate  0.2 mg Intravenous Q8H    heparin (porcine)  5,000 Units Subcutaneous Q12H SCH    insulin glargine  7 Units Subcutaneous QAM    insulin lispro  1-8 Units Subcutaneous Q6H    meropenem  500 mg  Intravenous Q24H    micafungin  100 mg Intravenous Q24H    pantoprazole  40 mg Intravenous BID    thiamine  100 mg Intravenous Q24H SCH       acetaminophen, albuterol-ipratropium, benzocaine, dextrose **OR** dextrose **OR** dextrose **OR** glucagon (rDNA), diphenhydrAMINE, hydrALAZINE, labetalol, lidocaine 2% jelly, naloxone, phenol     Physical Exam:     General appearance - ill-appearing  Mental status - eyes closed, does not respond to questions  Chest - coarse breath sounds  Heart - normal rate, regular rhythm  Abdomen - soft, midline wound vac in place w/ cloudy appearing output  Extremities - peripheral pulses normal, no pedal edema, no clubbing or cyanosis

## 2022-07-19 NOTE — Progress Notes (Signed)
[facility] Consultation Note                                       Date Time: 07/19/22 10:41 AM  Patient Name: Shelby Barron, Shelby Barron  Requesting Physician: Lynnae Prude, MD  Date of Admission: 06/28/2022    CC / Reason for Consultation: LUE weakness         Assessment:   82y/o woman hx of COPD, CVA, DM, renal cell carcinoma s/p bilateral nephrectomy, [name], new diagnosis of urothelial cancer admitted for elective cystectomy and completion nephrectomy. [name]. Hospital course complicated by new onset A fib with RVR. Neurology consulted for LUE weakness and change in speech.     MRI brain with patchy areas of acute infarct involving bilateral cerebral hemispheres. Pattern suggestive of watershed infarcts. Infarcts likely in setting of drop in blood pressure based on distribution of infarcts on imaging. Symptoms reportedly first noticed after patient returned from OR.     Now with waxing and waning mental status, noted to be less responsive. Repeat MRI and EEG unrevealing. Decline in mental status likely related to toxic-metabolic encephalopathy vs hospital delirium.     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   -cEEG w/ generalized slowing with intermittent GPD's. No seizures     Plan:   -ASA 81mg  daily cleared by GI. Long term would likely benefit from anticoagulation in setting of new onset A fib but would favor waiting 10 days from onset due to size of infarcts and potential for hemorrhagic conversion  -Continue high intensity statin  -SBP goal 120-160   -Metabolic and infectious workup per primary team. Suggest checking ABG  -PT/OT evaluation    Discussed w/ pt's daughter. Discussed w/ Dr. Sedalia Muta    Subjective   No acute events overnight. Pt continues to be sleepy but able to walk up for parts of the exam. MRI brain w/o acute findings. cEEG  without seizures. [name] concerned that her decline occurred due to transfer out of the ICU     Meds     Home :   Prior to Admission medications    Medication Sig Start Date End Date Taking? Authorizing Provider   allopurinol (ZYLOPRIM) 100 MG tablet Take 1 tablet (100 mg) by mouth daily   Yes [provider]   atorvastatin (LIPITOR) 40 MG tablet Take 1 tablet (40 mg) by mouth daily   Yes [provider]   conjugated estrogens (PREMARIN) vaginal cream Place vaginally daily   Yes [provider]   insulin aspart (NOVOLOG) 100 UNIT/ML injection Inject 5-10 Units into the skin as needed   Yes [provider]   Insulin Degludec Evaristo Bury) 100 UNIT/ML Solution Inject 36 Units into the skin daily   Yes [provider]   metoprolol tartrate (LOPRESSOR) 25 MG tablet Take 0.5 tablets (12.5 mg) by mouth daily   Yes [provider]   montelukast (SINGULAIR) 10 MG tablet Take 1 tablet (10 mg) by mouth nightly   Yes [provider]   ondansetron (Zofran) 4 MG tablet Take 1 tablet (4 mg) by mouth every 8 (eight) hours as needed for Nausea 06/04/22  Yes Yousuf, Munib A, MD   oxybutynin XL (DITROPAN-XL) 5 MG 24 hr tablet Take  1 tablet (5 mg) by mouth daily   Yes [provider]   oxyCODONE (OXY-IR) 5 MG capsule Take 1 capsule (5 mg) by mouth every 4 (four) hours as needed   Yes [provider]   pantoprazole (PROTONIX) 40 MG tablet Take 1 tablet (40 mg) by mouth daily   Yes [provider]   pregabalin (LYRICA) 50 MG capsule Take 1 capsule (50 mg) by mouth Per pt has not been taking   Yes [provider]   sevelamer carbonate (RENVELA) 800 MG tablet Take 1 tablet (800 mg) by mouth 2 (two) times daily   Yes [provider]   torsemide (DEMADEX) 100 MG tablet Take 1 tablet (100 mg) by mouth daily   Yes [provider]   umeclidinium-vilanterol (Anoro Ellipta) 62.5-25 MCG/ACT Aerosol Pwdr, Breath Activated Inhale 1  puff into the lungs daily   Yes [provider]   Zolpidem Tartrate (AMBIEN PO) Take 5 mg by mouth as needed   Yes [provider]   b complex vitamins capsule Take 1 capsule by mouth daily    [provider]   cinacalcet (SENSIPAR) 30 MG tablet Take 1 tablet (30 mg) by mouth daily    [provider]   clopidogrel (PLAVIX) 75 MG tablet Take 1 tablet (75 mg) by mouth daily    [provider]   hyoscyamine (LEVSIN) 0.125 MG tablet Take 1 tablet (0.125 mg) by mouth every 4 (four) hours as needed for Cramping 06/02/22   Yousuf, Munib A, MD      Inpatient :   Current Facility-Administered Medications   Medication Dose Route Frequency    albuterol-ipratropium  3 mL Nebulization Q6H WA    amiodarone  200 mg per NG tube Daily    aspirin  81 mg per NG tube Daily    darbepoetin alfa  0.45 mcg/kg Subcutaneous Weekly    glycopyrrolate  0.2 mg Intravenous Q8H    heparin (porcine)  5,000 Units Subcutaneous Q12H SCH    insulin glargine  7 Units Subcutaneous QAM    insulin lispro  2-10 Units Subcutaneous Q4H SCH    meropenem  500 mg Intravenous Q24H    metoprolol tartrate  12.5 mg per NG tube Q12H [facility]    micafungin  100 mg Intravenous Q24H    pantoprazole  40 mg Intravenous BID    thiamine  100 mg Intravenous Q24H [facility]         Physical Exam:   Temp:  [97.8 F (36.6 C)-99.3 F (37.4 C)] 97.8 F (36.6 C)  Heart Rate:  [59-88] 59  Resp Rate:  [15-35] 15  BP: (92-174)/(56-98) 144/74       General: Not fully cooperative with the exam  Neck: supple  CVS: warm and well perfused  Resp: no respiratory distress  Extremities: no pedal edema, no rashes noted    Neurological Examination:  MSE: sleepy but wakes to noxious stimuli, decreased speech output, not cooperating with formal speech exam, follows simple commands  CN: Pupils 4-->67mm b/l, right gaze deviation - unable to cross midline, no nystagmus, dec blink to threat on L, face symmetric  Motor: No movement of LUE. RUE proximally 0/5, distally  3/5. B/l LE with minimal withdrawal to noxious stimuli  Sensory: grimaces to noxious stimuli throughout  Coordination: does not cooperate w/ FTN    Labs:     Results       Procedure Component Value Units Date/Time    Fungal Identification, Molds [  [id number]] Collected: 07/04/22 1034    Specimen: Sputum, Suctioned Updated: 07/19/22 0948     Scanned Result See Scanned Result    Whole Blood Glucose POCT [[id number]]  (Abnormal) Collected: 07/19/22 0749    Specimen: Blood, Capillary Updated: 07/19/22 0751     Whole Blood Glucose POCT 140 mg/dL     Whole Blood Glucose POCT [[id number]]  (Abnormal) Collected: 07/19/22 0351    Specimen: Blood, Capillary Updated: 07/19/22 0352     Whole Blood Glucose POCT 164 mg/dL     Magnesium [[id number]]  (Normal) Collected: 07/19/22 0216    Specimen: Blood, Venous Updated: 07/19/22 0243     Magnesium 1.9 mg/dL     Basic Metabolic Panel [[id number]]  (Abnormal) Collected: 07/19/22 0216    Specimen: Blood, Venous Updated: 07/19/22 0243     Glucose 196 mg/dL      BUN 45 mg/dL      Creatinine 3.9 mg/dL      Calcium 9.0 mg/dL      Sodium 253 mEq/L      Potassium 4.5 mEq/L      Chloride 102 mEq/L      CO2 23 mEq/L      Anion Gap 14.0     GFR 10.8 mL/min/1.73 m2     Phosphorus [[id number]]  (Normal) Collected: 07/19/22 0216    Specimen: Blood, Venous Updated: 07/19/22 0243     Phosphorus 3.9 mg/dL     CBC without Differential [[id number]]  (Abnormal) Collected: 07/19/22 0216    Specimen: Blood, Venous Updated: 07/19/22 0228     WBC 13.86 x10 3/uL      Hemoglobin 7.4 g/dL      Hematocrit 64.3 %      Platelet Count 354 x10 3/uL      MPV 10.7 fL      RBC 2.47 x10 6/uL      MCV 91.1 fL      MCH 30.0 pg      MCHC 32.9 g/dL      RDW 15 %      nRBC % 0.0 /100 WBC      Absolute nRBC 0.00 x10 3/uL     Whole Blood Glucose POCT [[id number]]  (Abnormal) Collected: 07/18/22 2342    Specimen: Blood, Capillary Updated: 07/18/22 2349     Whole Blood Glucose POCT 166 mg/dL     Whole Blood Glucose POCT [[id number]]   (Abnormal) Collected: 07/18/22 1958    Specimen: Blood, Capillary Updated: 07/18/22 2010     Whole Blood Glucose POCT 146 mg/dL     Whole Blood Glucose POCT [[id number]]  (Abnormal) Collected: 07/18/22 1517    Specimen: Blood, Capillary Updated: 07/18/22 1529     Whole Blood Glucose POCT 153 mg/dL     Culture, Blood, Aerobic And Anaerobic [[id number]] Collected: 07/18/22 1400    Specimen: Blood, Venous Updated: 07/18/22 1416    Culture, Blood, Aerobic And Anaerobic [[id number]] Collected: 07/18/22 1400    Specimen: Blood, Venous Updated: 07/18/22 1412    Whole Blood Glucose POCT [[id number]]  (Abnormal) Collected: 07/18/22 1158    Specimen: Blood, Capillary Updated: 07/18/22 1200     Whole Blood Glucose POCT 183 mg/dL             Rads:     Results for orders placed or performed during the hospital encounter of 06/28/22   CT Head WO Contrast    Narrative    HISTORY: Altered mental status.     COMPARISON: Brain MRI 09/20/2008.    TECHNIQUE:  CT of the head performed without intravenous contrast. The  following dose reduction techniques were utilized: automated exposure  control and/or adjustment of the mA and/or KV according to patient size,  and the use of an iterative reconstruction technique.    CONTRAST: None.    FINDINGS:  The ventricles and sulcal pattern are within normal limits for the  patient's stated age. There is mild-moderate supratentorial chronic  ischemic disease. No acute infarct is noted. No intracranial hemorrhage is  seen. There is minimal paranasal sinus disease. There is moderate right  mastoid opacification. 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      Impression       1.No acute intracranial abnormality is seen.    Leandro Reasoner, MD  07/06/2022 3:56 AM         Kelsen Celona Gypsy Balsam, MD  [facility]  Available on Epic chat 7a to 7p

## 2022-07-19 NOTE — Progress Notes (Signed)
[facility] Palliative Medicine & Comprehensive Care  Service Phone Number: Mackie Pai: [phone]  Mon-Fri 9a-4p     Palliative Care Follow Up   Date Time: 07/19/22 9:47 AM   Patient Name: ELLASYN, LIPS   Location: U9811/B1478-29   Attending Physician: Lynnae Prude, MD   Consulting Provider: Ane Payment, PA-C  Consulting Service: Palliative Medicine and Comprehensive Care  Consulted request from [facility], Manfred Arch, MD to see patient regarding:   Reason for Referral: Clarify goals of care; Psychosocial or spiritual support        Assessment & Plan   Impression      CAMREY CAIRNES is a 82 y.o. female with significant PMH of [name] TTS, CVA (2004), HFpEF, RCC s/p partial b/l nephrectomy, and recently diagnosed urothelial cancer. Pt was admitted to [facility] on 06/28/2022 for elective cystectomy and completion nephrectomy with surgery complicated by intraoperative enterotomy resulting in hemorrhagic shock with abdomen left in discontinuity who is now s/p abdominal anastomosis and abdominal wall closure (6/12), with course further complicated by acute metabolic encephalopathy, new atrial fibrillation with RVR, GNR pneumonia, and dysphagia requiring NGT.     Palliative medicine initially consulted 6/19, signed off as goals were clear for continued restorative care with no limits. Palliative medicine re-engaged 7/1 as patient remains encephalopathic, MRI has revealed watershed infarcts, on 6/29 patient more lethargic with change in breathing pattern.     7/1: Daughter at bedside this afternoon. Patient OOB to chair, NGT in place, not interactive, not communicative.    Re-introduced palliative medicine team - daughter recalls speaking with Dr. Phineas Semen earlier in admission. Daughter reports that their goals have not changed regarding code status or restorative care. Daughter was not informed that palliative medicine was re-consulted to discuss goals of care, nothing that "goals have not changed since we last spoke  with your colleague." She is not interested in speaking with palliative team (she also speaks on behalf of her dad/patient's mPOA/husband). Encouraged daughter to reach out and request palliative consult if she and her father ever wish to address patient's GOC/quality of life.     Estimated Prognosis: Unclear     Recommendations   1. Goals of Care:       # Capacity:       - Patient does not have capacity for medical decision making.       # Conversation:       - 7/1: Daughter recalls speaking with Dr. Phineas Semen earlier in admission. Daughter reports that their goals have not changed regarding code status or restorative care. Daughter was not informed that palliative medicine was re-consulted to discuss goals of care, nothing that "goals have not changed since we last spoke with your colleague." She is not interested in speaking with palliative team (she also speaks on behalf of her dad/patient's mPOA/husband). Encouraged daughter to reach out and request palliative consult if she and her father ever wish to address patient's GOC/quality of life.        - 6/19: Family meeting held with husband, daughter, and niece (family physician). Medical update provided, reviewed hospitalization, reviewed chronic conditions including [name], her cardiac history, and her oncological history leading to elective surgeries resulting in complications requiring ICU level of care. Family is clear they would like no limits to care and are OK with CPR and re-intubation in a code event as needed. Discussed one option could be for continued optimization towards comfort/hospice on discharge whenever medically cleared. Family defers any comfort/hospice approach  to care on discharge as they are hopeful patient will be able to regain strength via PT/OT/rehab.  Goals are for continued full restorative care with goals for SNF/rehab vs home with home health, for follow ups with outpatient specialists, and to continue FULL CODE resuscitation  status, to set NO LIMITS to care for now.       # Code Status:       - Full Code      # Advance Directive/Living Will:       - Medical Decision Making Capacity:  no       - ACP Validation: Patient unable to complete       - Medical Decision Maker: husband, Joaquim Lai. Daughter, Rosemarie Rohr assists with medical decision making. No MPOA paperwork.        - ACP Document: None      # Palliative Care:       - Introduced the Palliative Medicine team available here at [facility]. Reviewed that team assists in guiding communication between patients, their families, and the different providers involved in a patient's care to ensure that everyone is on the same page and working toward the same goals.     2. Symptom Management:      # Risk for Delirium:       - Delirium Precautions:           * Frequent reorientation by staff and maintaining appropriate day-night cycles          * Maximize visibility of environmental cues such as clocks, calendars          * Encourage communication and visits with family members and friends          * Allow exposure to natural light during the day, opening curtains to window          * Encourage day time activities, OOB and keeping pt active during day if possible           * Minimize disturbances and reduce light exposure and TV in the evening/night           * Limit non essential care such as blood draws, imaging, etc          * Group blood draws together to limit frequency of needle sticks           * Optimize nutrition, bladder/bowel regimen. Constipation, urinary retention, dehydration may worsen delirium. Rule out infection such as UTI or PNA which can predispose patients to delirium.          * Minimize medications that may cause/exacerbate delirium: opiates, anticholinergics, antihistamines, benzodiazepines, and other sedatives    3. Psychosocial:       # Support:      - Family support from husband [place], daughter [name] and niece Alona Bene.     4. Spiritual:        # [employer]      5. PC Team follow-up plans: Palliative Medicine will sign off as of 07/19/22 as goals are clear for restorative care and family does not wish to engage with palliative care at this time - encouraged daughter to reach out and request palliative consult if she and her father ever wish to address patient's GOC/quality of life. Thank you for allowing [place] the pleasure of caring for RUDELL NAUMANN this admission. Please re-consult palliative should further needs arise.        Discharge Disposition: TBD      Outpatient Follow Up Recommended:  no      Outcomes: Clarified goals of care    40 minutes. Total time on unit today in care of [name] including chart review, face to face evaluation, management, counseling and coordination of care.     Discussed case/recommendations with:   - Dr. Dolores Frame (ICU)      Ane Payment, [facility]  Palliative Medicine Physician Assistant  [place]: [phone], M-F 9a-4p     Subjective/Interval History     7/1: Daughter at bedside this afternoon. Patient OOB to chair, NGT in place, not interactive, not communicative.    Re-introduced palliative medicine team - daughter recalls speaking with Dr. Phineas Semen earlier in admission. Daughter reports that their goals have not changed regarding code status or restorative care. Daughter was not informed that palliative medicine was re-consulted to discuss goals of care, nothing that "goals have not changed since we last spoke with your colleague." She is not interested in speaking with palliative team (she also speaks on behalf of her dad/patient's mPOA/husband). Encouraged daughter to reach out and request palliative consult if she and her father ever wish to address patient's GOC/quality of life.        Palliative Functional and Symptom Assessment     Palliative Performance Scale: 30% - Totally bed bound, unable to do any work, extensive disease, total care, reduced intake, full LOC, drowsy or confusion       Medications   Scheduled  Meds  Current Facility-Administered Medications   Medication Dose Route Frequency    albuterol-ipratropium  3 mL Nebulization Q6H WA    amiodarone  200 mg per NG tube Daily    aspirin  81 mg per NG tube Daily    darbepoetin alfa  0.45 mcg/kg Subcutaneous Weekly    glycopyrrolate  0.2 mg Intravenous Q8H    heparin (porcine)  5,000 Units Subcutaneous Q12H SCH    insulin glargine  7 Units Subcutaneous QAM    insulin lispro  2-10 Units Subcutaneous Q4H SCH    meropenem  500 mg Intravenous Q24H    metoprolol tartrate  12.5 mg per NG tube Q12H [facility]    micafungin  100 mg Intravenous Q24H    pantoprazole  40 mg Intravenous BID    thiamine  100 mg Intravenous Q24H SCH      DRIPS     PRN MEDS  Current Facility-Administered Medications   Medication Dose    acetaminophen  650 mg    albuterol-ipratropium  3 mL    benzocaine  1 spray    dextrose  15 g of glucose    Or    dextrose  12.5 g    Or    dextrose  12.5 g    Or    glucagon (rDNA)  1 mg    diphenhydrAMINE  25 mg    hydrALAZINE  10 mg    HYDROmorphone  1 mg    labetalol  10 mg    lidocaine 2% jelly      naloxone  0.2 mg    oxyCODONE  2.5 mg    phenol  1 spray       Allergies   Allergies   Allergen Reactions    Metrizamide Shortness Of Breath and Nausea Only    Rosuvastatin Other (See Comments)     myopathy    myopathy   myopathy    myopathy myopathy      myopathy    myopathy, myopathy    Iodinated Contrast Media Other (See  Comments)     Nausea and SOB per patient   On Dialysisi    Nausea and SOB per patient    Nausea and SOB per patient      Nausea and SOB per patient   Nausea and SOB per patient    Nausea and SOB per patient      Nausea and SOB per patient Nausea and SOB per patient      Nausea and SOB per patient Nausea and SOB per patient Nausea and SOB per patient      Nausea and SOB per patient  Nausea and SOB per patient Nausea and SOB per patient    Nausea and SOB per patient   Nausea and SOB per patient    Pioglitazone      Weight gain    Sulfa Antibiotics Hives      blister       Physical Exam   Vital Signs:  BP 144/74   Pulse (!) 59   Temp 97.8 F (36.6 C) (Oral)   Resp 15   Ht 1.575 m (5\' 2" )   Wt 86.7 kg (191 lb 2.2 oz)   SpO2 100%   BMI 34.96 kg/m     Intake/Output:    Intake/Output Summary (Last 24 hours) at 07/19/2022 0947  Last data filed at 07/19/2022 0600  Gross per 24 hour   Intake 680 ml   Output 0 ml   Net 680 ml       Physical Exam:  Physical Exam  Constitutional:       General: She is not in acute distress.     Appearance: She is ill-appearing.      Comments: OOB to chair. Not interactive, not communicative. Cachectic   HENT:      Head:      Comments: Bitemporal wasting     Mouth/Throat:      Mouth: Mucous membranes are dry.   Eyes:      Conjunctiva/sclera: Conjunctivae normal.   Cardiovascular:      Rate and Rhythm: Normal rate.   Pulmonary:      Effort: Pulmonary effort is normal. No respiratory distress.      Breath sounds: No stridor.   Skin:     General: Skin is warm and dry.   Neurological:      Mental Status: She is disoriented.            Labs / Radiology   Lab and diagnostics: reviewed in Epic  Recent Labs   Lab 07/19/22  0216   WBC 13.86*   Hemoglobin 7.4*   Hematocrit 22.5*   Platelet Count 354*       Recent Labs   Lab 07/14/22  0435   PT 12.4   INR 1.1   PTT 28        Recent Labs   Lab 07/19/22  0216   Sodium 139   Potassium 4.5   Chloride 102   CO2 23   BUN 45*   Creatinine 3.9*   GFR 10.8*   Glucose 196*   Calcium 9.0     Recent Labs   Lab 07/18/22  0421   Bilirubin, Total 0.2   Protein, Total 4.9*   Albumin 1.8*   ALT 12   AST (SGOT) 18          CT Abdomen Pelvis W IV And PO Cont    Result Date: 07/19/2022   1.Slight interval increase in left-sided retroperitoneal fluid collection with the other  abdominal fluid collections appearing relatively stable. 2. There is no definite abscess or acute inflammatory process. 3. Incidental note is made of a small paraesophageal type hernia. 4. Sigmoid diverticulosis is noted without CT evidence of  diverticulitis. 5. Other findings as noted above. Miguel Dibble, MD 07/19/2022 6:36 AM    MRI Brain WO Contrast    Result Date: 07/18/2022   Interval evolution of multifocal recent infarcts in the bilateral cerebral hemispheres. No new infarct. Milan Waunita Schooner, MD 07/18/2022 5:57 PM    XR Chest AP Portable    Result Date: 07/17/2022  1.Near-complete resolution of interstitial edema. 2.Improving pulmonary vascular congestion. Legrand Pitts, MD 07/17/2022 2:38 PM    CT Head WO Contrast    Result Date: 07/17/2022   1.No new acute intracranial pathology. 2.There are multiple regions of cytotoxic edema in the right > left frontal and left > right occipital lobes, consistent with known early subacute infarcts. Dannielle Burn 07/17/2022 8:26 AM    US Carotid Duplex Dopp Comp Bilateral    Result Date: 07/15/2022   1. Plaque formation in the right internal carotid artery bulb with elevated velocities. Stenosis is estimated at 50-69%. 2. Plaque in the left internal carotid artery with normal velocities. Any stenosis is less than 50%. Criteria for stenosis and velocity parameters are based on the guidelines of the SRU (Society of Radiologists in Ultrasound). Grant et al: Carotid Artery Stenosis: Grayscale and Doppler Ultrasound Diagnosis-- Society of Radiologists in Ultrasound Consensus Conference Ultrasound Quarterly. Volume 19, Number 4; December, 2003. Recommendations updated October 2021 Verne Carrow is an [facility] accredited facility Suszanne Finch, MD 07/15/2022 9:41 AM    Tunneled Cath Removal (Permcath)    Result Date: 07/14/2022  1. Nonfunctioning tunneled dialysis catheter as described above. 2. Successful placement of a new 19 cm Equistream catheter as described above. Dimitrios Papadouris, MD 07/14/2022 4:06 PM    Tunneled Cath Placement (Permcath)    Result Date: 07/14/2022  1. Nonfunctioning tunneled dialysis catheter as described above. 2. Successful placement of a new 19 cm Equistream catheter as described above. Dimitrios  Papadouris, MD 07/14/2022 4:06 PM    MRI Brain WO Contrast    Result Date: 07/13/2022   1.Patchy areas of acute/recent infarction involving the cerebral hemispheres bilaterally. The infarcts have a configuration suggesting watershed territory infarcts. 2.Chronic ischemic changes as discussed. Georgann Housekeeper, MD 07/13/2022 10:28 PM    CT Head WO Contrast    Result Date: 07/13/2022   1.New region of loss of gray-white matter differentiation in the right frontal lobe, concerning for acute infarction. 2.A few additional areas of poor gray-white matter differentiation for example in the left occipital lobe may be artifactual but could be correlated with MRI. 3.No evidence of acute intracranial hemorrhage or significant mass effect. These urgent results were discussed with and acknowledged by Dr. Don Broach on 07/13/2022 3:20 PM. Vara Guardian, MD 07/13/2022 3:21 PM

## 2022-07-19 NOTE — Progress Notes (Signed)
Infectious Diseases & Tropical Medicine  Progress Note    07/19/2022   Shelby Barron:[id number],WNU:[id number] is a 82 y.o. female,       Assessment:     Encephalopathy  Acute CVA  History of bladder cancer and renal failure  Status post radical cystectomy/bilateral nephrectomy, enterectomy and lysis of adhesion (06/28/2022)  S/p small bowel resection  CT scan of abdomen and pelvis-no abscess or acute inflammatory process (07/19/2022)  Severe anemia  Leukocytosis-likely reactive  End-stage renal disease on hemodialysis  Atrial fibrillation  Risk of aspiration    Plan:     Continue meropenem and Mycamine-completing 2 weeks tomorrow  Continue local wound care  Follow-up chest x-ray  Continue probiotics  Monitor electrolytes and renal functions closely  Monitor clinically  Discussed with patient and her sister; and daughter over the telephone        ROS:     General:  no fever, awake and alert, sitting in the chair, sister at bedside  Respiratory: No cough or shortness of breath  Gastrointestinal: Abdominal wall wound VAC in place  Genito-Urinary: no hematuria  Musculoskeletal: no edema  Neurological: awake and alert  Dermatological: No rash    Physical Examination:     Blood pressure 144/74, pulse (!) 59, temperature 97.8 F (36.6 C), temperature source Oral, resp. rate 15, height 1.575 m (5\' 2" ), weight 86.7 kg (191 lb 2.2 oz), SpO2 100%.    General Appearance: Awake and alert  HEENT: Pupils are equal, round, and reactive to light.   Lungs: Decreased breath sounds  Heart: Regular rate and rhythm  Chest: Symmetric chest wall expansion.   Abdomen: soft, no hepatosplenomegaly, no bowel sounds  Neurological: awake  Extremities: No edema    Laboratory And Diagnostic Studies:     Recent Labs     07/19/22  0216 07/18/22  0421   WBC 13.86* 14.15*   Hemoglobin 7.4* 7.8*   Hematocrit 22.5* 24.5*   Platelet Count 354* 323     Recent Labs     07/19/22  0216 07/18/22  0421   Sodium 139 139   Potassium 4.5 3.7   Chloride 102 101    CO2 23 25   BUN 45* 31*   Creatinine 3.9* 2.8*   Glucose 196* 185*   Calcium 9.0 8.9     Recent Labs     07/18/22  0421   AST (SGOT) 18   ALT 12   Alkaline Phosphatase 122*   Protein, Total 4.9*   Albumin 1.8*       Current Meds:      Scheduled Meds: PRN Meds:    acetaminophen, 650 mg, Oral, 4 times per day   Or  acetaminophen, 650 mg, Oral, 4 times per day  amiodarone, 200 mg, per NG tube, Daily  aspirin, 81 mg, per NG tube, Daily  carvedilol, 12.5 mg, Oral, Q12H SCH  darbepoetin alfa, 0.45 mcg/kg, Subcutaneous, Weekly  glycopyrrolate, 0.2 mg, Intravenous, Q8H  heparin (porcine), 5,000 Units, Subcutaneous, Q12H SCH  insulin glargine, 7 Units, Subcutaneous, QAM  insulin lispro, 1-8 Units, Subcutaneous, Q6H  meropenem, 500 mg, Intravenous, Q24H  micafungin, 100 mg, Intravenous, Q24H  pantoprazole, 40 mg, Intravenous, BID  thiamine, 100 mg, Intravenous, Q24H SCH        Continuous Infusions:     acetaminophen, 650 mg, Q6H PRN  albuterol-ipratropium, 3 mL, Q6H PRN  benzocaine, 1 spray, TID PRN  dextrose, 15 g of glucose, PRN   Or  dextrose, 12.5 g, PRN  Or  dextrose, 12.5 g, PRN   Or  glucagon (rDNA), 1 mg, PRN  diphenhydrAMINE, 25 mg, Q6H PRN  hydrALAZINE, 10 mg, Q3H PRN  labetalol, 10 mg, Q15 Min PRN  lidocaine 2% jelly, , Once PRN  naloxone, 0.2 mg, PRN  phenol, 1 spray, Q2H PRN          Shelby Barron, M.D.  07/19/2022  11:43 AM

## 2022-07-19 NOTE — Treatment Plan (Cosign Needed)
4 eyes in 4 hours pressure injury assessment note:      Completed with:   Unit & Time admitted:              Bony Prominences: Check appropriate box; if wound is present enter wound assessment in LDA     Occiput:                 [x] WNL  []  Wound present  Face:                     [x] WNL  []  Wound present  Ears:                      [x] WNL  []  Wound present  Spine:                    [x] WNL  []  Wound present  Shoulders:             [] WNL  []  Wound present  Elbows:                  [x] WNL  []  Wound present  Sacrum/coccyx:     [] WNL  [x]  Wound present - tear  Ischial Tuberosity:  [x] WNL  []  Wound present  Trochanter/Hip:      [x] WNL  []  Wound present  Knees:                   [x] WNL  []  Wound present  Ankles:                   [x] WNL  []  Wound present  Heels:                    [x] WNL  []  Wound present  Other pressure areas:  []  Wound location       Device related: [x]  Device name: Wound vac applied to abdomen        LDA completed if wound present: yes/no  Consult WOCN if necessary    Other skin related issues, ie tears, rash, etc, document in Integumentary flowsheet

## 2022-07-19 NOTE — Progress Notes (Signed)
[facility]  Progress Note    Patient Name: Shelby Barron  MRN: [id number]  Room: A2715/A2715-01    Reason for Admission: Bladder cancer  ICU Day: 16h    Overview   Shelby Barron is a 82 y.o. female with Hx of COPD, prior CVA (2004), T2DM, renal cell carcinoma status post partial bilateral nephrectomy, [name], and recent diagnosis of urothelial cancer.  Patient initially admitted on 6/10 for scheduled cystectomy and complete nephrectomy.  OR course was complicated by small bowel enterotomy and significant intraoperative bleeding (1 L EBL), patient was admitted to ICU in discontinuity with open abdomen.  Patient's hemorrhagic shock was treated with blood transfusions, also treated with broad-spectrum antibiotics. Patient went to the OR on 6/12 for small bowel anastomosis and closure with wound VAC placement.  Patient weaned off pressors and extubated to nasal cannula pm 6/13. Initially patient required CRRT due to shock, decision to IHD on 6/15.  Course was further complicated by delirium and encephalopathy.  Patient downgraded out of ICU on 6/16.    Patient continued to be encephalopathic with episode of improvement however on June 25 noticed weakness of left upper extremity and MRI showed areas of recent infarction involving the cerebral hemisphere bilaterally and watershed territory pattern.  Patient continued treatment in the hospital with hemodialysis antibiotic wound care.  She continued on treatment for A-fib with amiodarone and she continued on aspirin as well as.  No therapeutic anticoagulation started yet.  She also continued on multiple antibiotic allergies infectious disease team giving intra-abdominal pathology.  Reportedly starting on June 29 patient is more lethargic, she moans and not able to say her name which is a change compared to a few days prior. Upgraded to ICU due to concern for worsening encephalopathy.     Subjective   Hospital Course:  7/1: repeat MRI brain negative  for new abnormality, EEG negative for seizure, not hypercarbic, CT Abd/P w/out intra-abdominal abscess or evidence of new infection; Pt. Afebrile w/ downtrending wbc, on prolonged mero & micafungin which will end tomorrow per ID. Concern for prolonged delirium, worsened by prolonged effects of opiates in the setting of her [name]. This morning patient is confused, oriented x 1, speaks intermittently, protecting airway.      Objective   Physical Examination   Last vent settings if on vent:       Vitals Temp:  [97.4 F (36.3 C)-100.6 F (38.1 C)]   Heart Rate:  [60-103]   Resp Rate:  [17-35]   BP: (92-174)/(56-98)   SpO2:  [93 %-100 %]   Height:  [157.5 cm (5\' 2" )]   Weight:  [86.7 kg (191 lb 2.2 oz)-87.5 kg (192 lb 14.4 oz)]   BMI (calculated):  [35-35.3]  // Temperature with 24 range    Input / Output:   Intake/Output Summary (Last 24 hours) at 07/19/2022 0720  Last data filed at 07/19/2022 0600  Gross per 24 hour   Intake 680 ml   Output 0 ml   Net 680 ml     General Appearance: Chronically ill looking female not in distress, awakens to voice  Mental status: Oriented x self only  Neuro: Face is symmetrical, right gaze deviation does not cross midline, pupils equal reactive bilaterally, good gag and cough reflex, not coherent speech at times, weak left arm, moving other extremities to painful stimuli but not to command  Lungs: Clear to auscultation b/l, decreased at bases  Cardiac: Normal rate, S1S2, no appreciable  murmurs  Abdomen: Soft and lax nontender, wound VAC in place  Extremities: No edema  Skin: Has saccral decubitus    [facility] Checklist  Sedation:   CAM-ICU: Positive or Negative for Delirium: Negative   CAM ICU:   Last Documented RASS: RASS Score: Drowsy   Currently ordered infusions:    Reviewed: Yes   Mobility:   Current Mobility Level:   PMP Activity: Step 1 - Bedrest (07/19/2022  6:00 AM)    Current PT Order: Yes  Current OT Order: Yes Reviewed: Yes   Respiratory (n/a if blank):   Ventilator Time:     Last Recorded Vent Mode:    Reviewed: Yes   Gastrointenstinal  Last Bowel Movement:   Last BM Date:  (PTA) Reviewed: Yes   CAUTI Prevention (n/a if blank):  Foley Day:        Reviewed: Yes   Blood Steam Infection Prevention (n/a if blank):   Permacath Catheter - Tunneled 07/14/22 Subclavian vein catheter Right Reviewed: Yes   DVT Chemoprophylaxis (none if blank):   heparin (porcine) injection 5,000 Units  Reviewed: Yes     Assessment   9F w/ COPD, CVA, DM, renal cell carcinoma s/p bilateral nephrectomy, [name], new diagnosis of urothelial cancer admitted for elective cystectomy and completion nephrectomy. [name]. Hospital course complicated by new onset A fib with RVR. Neurology consulted for LUE weakness and change in speech. MRI brain with patchy areas of acute infarct involving bilateral cerebral hemispheres. Pattern suggestive of watershed infarcts. Infarcts likely in setting of drop in blood pressure based on distribution of infarcts on imaging. Symptoms reportedly first noticed after patient returned from OR. She was upgraded to ICU on 6/30 for waxing and waning mental status, repeat MRI negative for new abnormality, EEG negative for seizure, no hypercarbic, CT Abd/P w/out intra-abdominal abscess or evidence of new infection, on prolonged mero & micafungin which will end tomorrow. Her decline in mental status is likely related to toxic-metabolic encephalopathy, prolonged effects of opiates for analgesia in the setting of her [name] & hospital delirium.       Patient has BMI=Body mass index is 34.96 kg/m.  Diagnosis: Obesity based on BMI criteria         Recent Labs   Lab 07/19/22  0216 07/18/22  0421 07/17/22  0431   Hemoglobin 7.4* 7.8* 7.9*   Hematocrit 22.5* 24.5* 24.8*   MCV 91.1 92.1 91.9   WBC 13.86* 14.15* 15.03*   Platelet Count 354* 323 286     Recent Labs   Lab 07/18/22  0421 06/02/22  0514   Ferritin 2,300.21*  --    Iron  18* 62   TIBC 99* 145*   Iron Saturation 18 43     Anemia Diagnosis: Chronic: Anemia of Chronic Disease   Malnutrition Documentation    Moderate Malnutrition related to inadequate nutritional intake in the setting of acute illness as evidenced by <50% EER > 5 days, mild muscle losses (temporalis, deltoid) - new              Plan   NEURO:   -Acute changes in neurological status: likely Delirium, worsened by opiate use  - Recent watershed stroke  - History of [name]  Discussed with neurology:  - Spot EEG negative  - MRI brain without contrast without new changes compared to prior  - Neurocheck every 4 hours  - Continue with aspirin, may be  able to start systemic anticoagulation on July 5 [10 days post initial diagnosis of stroke as recommended by neurology team]   - CAM ICU monitoring per protocol  - Ensure delirium precautions  - Re-orient as needed; Maintain normal day-night/sleep-wake cycle  - PT/OT; OOBTC daily    CV:   Blood pressure goal: MAP above 65  -Paroxysmal A-fib: Currently rate controlled, continue with amiodarone and beta-blocker     PULM:   Maintaining adequate oxygenation and ventilation, currently protecting airway  Strict aspiration precautions  Encourage incentive spirometer use 10 times per hour     GI:   GI PPX: Pantoprazole  Nutrition: Advance tube feeding as tolerated; SLP re- eval today  Bowel regimen if needed     RENAL:   -S/P Cystectomy and completion nephrectomy complicated by small bowel enterotomy   Appreciate nephrology, on hemodialysis 3 times a week TTS via newly inserted permacath on June 26  Avoid nephrotoxic agents  Renally adjust all medication doses     ID:   -Peritonitis and possible intra-abdominal fluid collection status post iatrogenic small bowel enterotomy:  WBC trending down, patient afebrile, abdominal exam is benign, wound VAC remains in place,  Appreciate infectious disease recommendations- continue mero & micafungin until 07/20/2022     HEME:   SCD's  Pharmacologic  ppx: Subcu heparin     ENDO/RHEUM:   Glucose: goal 100 -180; Lantus and sliding scale    FAMILY UPDATE:   Patient's daughter was updated, they are appreciative and all questions were answered.     Advance Care Planning: Open ACP Navigator  Code Status: Full Code  Next of Kin:  Primary Emergency Contact: Whistler,Nicole         ADVANCE CARE PLAN               As the physician signing this note, I have reviewed the history and exam and pertinent imaging results. My addendum revises, addends or confirms the recommendations or plans in this note. I discussed with the plan of care with the APP/Resident and I agree with the findings noted unless revised or updated in my notations. I formulated all of the medical decision making noted in the assessment & plan.    This patient is critically ill with life-threatening condition(s) and a high probability of sudden clinically significant deterioration due to the condition(s) noted in the assessment and plan, which requires the highest level of physician's / advance practice provider's preparedness to intervene urgently.  Full attention to the direct care of this patient was provided for the period of time noted, which includes review of laboratory data, radiology results, discussion with consultant(s) monitoring for potential decompensation and performing a history exam and medical decision making.  All critical care time personally performed today was exclusive of teaching, billable procedures, and not overlapping with any other physicians or advance practice providers.    Total critical care time: 62 minutes   Attending Physician: Lynnae Prude, MD    Date: 07/19/2022  3:25 PM

## 2022-07-19 NOTE — OT Progress Note (Signed)
Occupational Therapy Note    Occupational Therapy Treatment  Shelby Barron        Post Acute Care Therapy Recommendations   Discharge Recommendations:  SNF    DME needs IF patient is discharging home: No additional equipment/DME recommended at this time    Therapy discharge recommendations may change with patient status.  Please refer to most recent note for up-to-date recommendations.    Unit: [place] [facility]  Bed: A2715/A2715-01    ___________________________________________________    Time of treatment:  OT Received On: 07/19/22  Start Time: 1430  Stop Time: 1454  Time Calculation (min): 24 min  Total Treatment Time (min): 12 (Split with PT to maximize potential & safety)    Chart Review and Collaboration with Care Team: 8 minutes, not included in above time.    OT Visit Number: 2      Precautions and Contraindications:    Precautions  Weight Bearing Status: no restrictions  Aspiration Precautions: see SLP recommendations  Fall Risks: High;Impaired balance/gait;Impaired mobility;Muscle weakness;Mental status change  Seizure Precautions: Yes (comment date of last seizure)  Communication Precautions: Expressive aphasia;Receptive aphasia;Unable to verbalize basic wants/needs/pain  Other Precautions: skin & pressure ulcer; SBP 120-160; L side flaccid    Personal Protective Equipment (PPE)  gloves and procedure mask    Updated Labs:  Lab Results   Component Value Date/Time    HGB 7.4 (L) 07/19/2022 02:16 AM    HGB 8.6 (L) 06/03/2022 04:23 AM    HCT 22.5 (L) 07/19/2022 02:16 AM    HCT 26.8 (L) 06/03/2022 04:23 AM    K 4.5 07/19/2022 02:16 AM    K 3.6 06/03/2022 04:23 AM    NA 139 07/19/2022 02:16 AM    NA 137 06/03/2022 04:23 AM    INR 1.1 07/14/2022 04:35 AM    INR 1.1 06/01/2022 03:15 AM    TROPI 9.6 05/26/2022 06:37 PM    TROPI -2.2 05/26/2022 06:37 PM    TROPI 11.8 05/26/2022 03:26 PM       All imaging reviewed, please see chart for details.    Subjective:    "Good." Pt  mostly nonverbal, did speak a few words upon initial entrance into room.  Patient Goal: none stated    Patient's medical condition is appropriate for Occupational Therapy intervention at this time.  Patient is agreeable to participation in the therapy session. Nursing clears patient for therapy.  Pain Assessment  Pain Assessment: Wong-Baker FACES  Wong-Baker FACES Pain Rating: Hurts little more  Pain Location: Back  Pain Intervention(s): Repositioned;Ambulation/increased activity;Rest;Therapeutic presence      Objective:  Observation of Patient/Vital Signs:  vss    Cognition/Neuro Status  Arousal/Alertness: Inconsistent responses to stimuli  Attention Span: Difficulty dividing attention  Orientation Level: Disoriented X4  Memory: Unable to assess  Following Commands: Follows one step commands with repetition;Follows one step commands with increased time;25%  Safety Awareness: unable to assess  Insights: Not aware of deficits  Problem Solving: Unable to assess  Behavior: calm;flat affect    Functional Mobility  Scooting Transfers: Dependent (long sitting in chair, dependent x2)  Sit to Stand Transfers: not tested (2/2 unsafe)    Self-care and Home Management  Grooming: Maximal Assist;in chair;wash/dry face  LB Dressing: Dependent  Toileting: Dependent    Therapeutic Exercises  Shoulder PROM: Flexion;Extension;Sitting (x10e BUE)  Elbow PROM: Flexion;Extension (x10e BUE)  Hand ROM: grasp and release (x3e BUE)    Neuro Re-Ed  Balance Training: Sitting reaching  activities (dependent)    Vision - Complex Assessment  Tracking: Requires cues, head turns, or add eye shifts to track (can turn head R/l & make eye contact w/additional effort & cues)      Educated the Family to role of occupational therapy, plan of care, goals of therapy and HEP, safety with mobility and ADLs with verbalized understanding .     Patient left in bedside chair with alarm and all other medical equipment in place and call bell and all personal  items/needs within reach.  RN notified of session outcome.  CM team and attending have been previously notified of d/c recommendation; no updates to d/c recommendation since that time.      Assessment:  Pt with slow progression towards OT goals, cont to demo deficits in dec BUE&BLE strength, cognition, ax tolerance impacting BADLs & functional mobility. Focus on PROM of BUE to dec swelling, placing BUE in elevated position w/wash cloth for functional grasp. Family in room reporting has been completing PROM of BUE. X4 dependent long sitting in chair, inc alertness but unable to assist. Cont to rec inpatient OT and SNF upon d/c.    PMP Activity: Step 5 - Chair  Distance Walked (ft) (Step 6,7): 0 Feet (dependent hoyer lift trf per RN)      Plan:  OT Frequency Recommended: 2-3x/wk  Goal Formulation: Patient      Time For Goal Achievement: by time of discharge  ADL Goals  Patient will groom self: Moderate Assist, Not met (RUE)     Neuro Re-Ed Goals  Pt will sit at edge of bed: Maximal Assist, for 3 minutes, Not met  Musculoskeletal Goals  Pt will perform Home Exercise Program: maximal assist, with caregiver/family assist, Goal met  Executive Fucntion Goals  Pt will demonstrate attention to task: with minimal assist, to increase ability to complete ADLs, Not met              Continue plan of care.      Sheppard Coil [place], [place]  [id number]  Physical Medicine and Rehabilitation  [facility]    07/19/2022  2:13 PM    [facility]  Patient: Shelby Barron MRN#: [id number]   Unit: [place] [facility] Bed: A2715/A2715-01

## 2022-07-19 NOTE — Progress Notes (Signed)
[facility]  PROGRESS NOTE      Patient: Shelby Barron  Date:07/08/2022   LOS: 21 Days  Admission Date: 06/28/2022   MRN: [id number]  Attending: Gayleen Orem, MD  When on service as the attending, please contact me on Epic Secure Chat from 7 AM - 7 PM for non-urgent issues. For urgent matters use XTend page from 7 AM - 7 PM.       ASSESSMENT/PLAN     Shelby Barron is a 82 y.o. female admitted with Bladder cancer    Interval Summary: [name], [name], type 2 diabetes, renal cell carcinoma status post partial bilateral nephrectomy, [name], new diagnosis of urothelial cancer initally presented on 6/10 for elective cystectomy and completion nephrectomy, [name], [name], extubated and off pressors since 6/13.     She is hypoactive and delirious. Surgery recommended NGT and CTAP today. Plan to start TF today. CTH pending.  Has [name] T/Th/Sat. Nephrology following     Developed new onset afib RVR today during HD. Stopped norvasc switched to metoprolol and gave additional IV metoprolol. Consulted cardiology. Discussed with ICU requested a consult given deterioration. Recommended repeat blood cultures and broadening antibiotics to meropenem. Palliative care consulted.    Rising WBC. Blood cultures NGTD. Patient tachypneic CXR with pulm edema. Discussed with nephrology giving 3 mg IV bumex. Had dark emesis. Vitals stable. TFs held, started IV PPI, follow AM labs. Discussed with GI and surgery.    No further episodes of Afib RVR now on amiodarone gtt and PO metoprolol. Hgb stable. Repeating CXR, clear. Started robinul for upper airway secretions/rattling. Clinically improving.    New permacath placement planned for this week. Then hopefully NGT can come out if patient tolerating PO and discharge planning.      For Tomorrow: Monitor mental status, if patient become more awake  consider speech and swallow evaluation, PT OT, discussed with ID about duration of treatment, follow-up with blood culture    Anticipated Discharge: Greater Than 48 Hours    Barriers to Discharge: Clinical improvement    Family/Social/Case Mgr Assistance: Patient might end up going to rehab  Patient Active Hospital Problem List:  #Encephalopathy/delirium,   Worsening of lethargy likely toxic metabolic  Patient was upgraded to ICU on 6/30 for lethargy and decreased responsiveness  CT head was repeated on 6/29, no acute finding  ABG consistent with respiratory alkalosis  EEG consistent with encephalopathy no seizure, MRI no acute stroke  Culture pending  CT abdomen with and without contrast after premedication did not show any leak or abscess  Patient is improving to day and downgraded  Continue supportive care        #S/P Cystectomy and completion nephrectomy complicated by small bowel enterotomy   #Mixed shock - hemorrhagic vs septic: Resolved  #Leukocytosis  - 6/10 small bowel enteretomy with 1L EBL. Went to the OR on June 12 for small bowel anastomosis, closure of mesenteric defect, closure of abdominal wall with wound VAC application  - Failed SLP eval . Wcont tube feeds per nutrition recs  -Wound VAC changed 6/15  - Surgery consulted, recommend CTAP with PO contrast to eval for intra-abdominal source of infection given uptrending WBC. Surgery ordered pre-medication with steroid/benadryl given h/o contrast allergy, showed no leak or enhancing fluid collection  -Continue meropenem/micafungin per ID recommendation, ID will  determine duration of treatment  on 07/06/22, noted fever and lethargy after discussing with ID as well as surgical team, recommended to CT of the abdomen with contrast, which showed no abscess or leak  Continue antibiotic  Duration of treatment per ID      #Acute CVA on the right frontal lobe  #History of CVA  - Hold home plavix  - Adhere to day/night cycles and administer constant  reorientation.   -Left upper extremity not moving at all unclear duration  -CT head showed right frontal infarction, neurology consulted recommended to start aspirin, discussed with GI and GI cleared, discussed with daughter and daughter are agreeable.  -Neurology following along  -Discussed with neurology, recommended blood pressure target systolic between 1 20-1 60 and also recommended continue NIH stroke scale monitoring  -Stroke order set placed in  -Carotid Doppler showed plaque with no critical stenosis, MRAhead pending  -Neurology recommended to wait 10 days prior to initiation of anticoagulation given the size of the stroke.  Which is 07/23/2022  -Repeat head CT done on 6/29 for lethargy showed no acute finding  -EEG encephalopathic pattern no seizure, repeat MRI no new infarction  -Patient downgraded on 7/1  -Continue reorientation, PT OT  -Speech and swallow evaluation once patient is more awake,    #Acute hypoxemic respiratory failure  #COPD  Extubated on 6/13, currently on room air  Maintain SaO2 > 92%  Continue as needed neb treatment  X-ray repeated on 6/29 showed resolution of interstitial edema    #ESRD  HD Tues/Thurs/Sat  Last dialysis on 6/29  Continue dialysis per nephrology  Nephrology following along    #Nonfunctioning right permacath  #Right [place] IJ cath  IR removed the nonfunctional permacath and right [place] IJ cath and replaced with a new permacath on 6/26.    #HTN  On metoprolol   PRN IV labetalol for SBP > 180     New onset Afib with RVR  - cardiology following  -CHA2DS2-VASc score is 7, 11% yearly risk of stroke  - stable now on amiodarone gtt and metoprolol, cardiology recommended to switch amiodarone to 200 mg daily on 07/16/2022  -GI and general surgery cleared for anticoagulation  -Neurology recommended to hold anticoagulation for 10 days [till 05/23/2022] given the size of stroke      #Renal cell carcinoma  #Urothelial carcinoma  #Thrombocytopenia  - VTE ppx: SCDs, subcutaneous  heparin     #Diabetes Mellitus type 2  -Sliding scale insulin          Nutrition: NPO, tube feeds  Malnutrition Documentation    Moderate Malnutrition related to inadequate nutritional intake in the setting of acute illness as evidenced by <50% EER > 5 days, mild muscle losses (temporalis, deltoid) - new                 Patient has BMI=Body mass index is 34.96 kg/m.  Diagnosis: Obesity based on BMI criteria                                               Recent Labs   Lab 07/19/22  0216 07/18/22  0421 07/17/22  0431   Hemoglobin 7.4* 7.8* 7.9*   Hematocrit 22.5* 24.5* 24.8*   MCV 91.1 92.1 91.9   WBC 13.86* 14.15* 15.03*   Platelet Count 354* 323 286     Recent Labs  Lab 07/18/22  0421 06/02/22  0514   Ferritin 2,300.21*  --    Iron 18* 62   TIBC 99* 145*   Iron Saturation 18 43       Code Status: Full Code    Dispo: pending improved mental status    Family Contact: Updated daughter    DVT Prophylaxis:   Current Facility-Administered Medications (Includes Only Anticoagulants, Misc. Hematological)   Medication Dose Route Last Admin    heparin (porcine) injection 5,000 Units  5,000 Units Subcutaneous 5,000 Units at 07/19/22 0839          CHART  REVIEW & DISCUSSION     The following chart items were reviewed as of 5:07 PM on 07/19/22:  [x]  Lab Results [x]  Imaging Results   [x]  Problem List  [x]  Current Orders [x]  Current Medications  [x]  Allergies  []  Code Status []  Previous Notes   []  SDoH    The management and plan of care for this patient was discussed with the following specialty consultants:  [x]  Cardiology  [x]  Gastroenterology                 []  Infectious Disease  []  Pulmonology [x]  Neurology                [x]  Nephrology  []  Neurosurgery []  Orthopedic Surgery  [x]  ICU []  General Surgery []  Psychiatry                                   [x]  Palliative    SUBJECTIVE     Shelby Barron evaluated in ICU, more alert and able to follow simple commands  No event reported overnight  Still unable to get review of  system  MEDICATIONS     Current Facility-Administered Medications   Medication Dose Route Frequency    acetaminophen  650 mg Oral 4 times per day    Or    acetaminophen  650 mg Oral 4 times per day    amiodarone  200 mg per NG tube Daily    aspirin  81 mg per NG tube Daily    carvedilol  12.5 mg Oral Q12H SCH    darbepoetin alfa  0.45 mcg/kg Subcutaneous Weekly    glycopyrrolate  0.2 mg Intravenous Q8H    heparin (porcine)  5,000 Units Subcutaneous Q12H SCH    insulin glargine  7 Units Subcutaneous QAM    insulin lispro  1-8 Units Subcutaneous Q6H    meropenem  500 mg Intravenous Q24H    micafungin  100 mg Intravenous Q24H    pantoprazole  40 mg Intravenous BID    thiamine  100 mg Intravenous Q24H SCH       PHYSICAL EXAM     Vitals:    07/19/22 1600   BP:    Pulse:    Resp:    Temp: 97.5 F (36.4 C)   SpO2:        Temperature: Temp  Min: 97.5 F (36.4 C)  Max: 99.3 F (37.4 C)  Pulse: Pulse  Min: 59  Max: 84  Respiratory: Resp  Min: 15  Max: 35  Non-Invasive BP: BP  Min: 132/60  Max: 174/75  Pulse Oximetry SpO2  Min: 94 %  Max: 100 %    Intake and Output Summary (Last 24 hours) at Date Time    Intake/Output Summary (Last 24 hours) at 07/19/2022 1707  Last data filed  at 07/19/2022 0600  Gross per 24 hour   Intake 0 ml   Output 0 ml   Net 0 ml           GEN APPEARANCE: Normal; more alert, oriented x 1  HEENT: PERLA; EOMI; Conjunctiva Clear  NECK: Supple; No bruits  CVS: RRR, S1, S2; No M/G/R  LUNGS: CTAB; wheezing improved, no Rhonchi: No rales  ABD: Soft; No TTP; + Normoactive BS  EXT: No edema; Pulses 2+ and intact  NEURO: moving right upper extremity spontaneously, able to move lower extremities for pain but minimal, no movement at all on the left upper extremity  MENTAL STATUS: Lethargic, oriented x 1, able to follow simple commands    LABS     Recent Labs   Lab 07/19/22  0216 07/18/22  0421 07/17/22  0431   WBC 13.86* 14.15* 15.03*   RBC 2.47* 2.66* 2.70*   Hemoglobin 7.4* 7.8* 7.9*   Hematocrit 22.5* 24.5* 24.8*    MCV 91.1 92.1 91.9   Platelet Count 354* 323 286       Recent Labs   Lab 07/19/22  0216 07/18/22  0421 07/17/22  0431 07/16/22  0244 07/15/22  0427   Sodium 139 139 135 134* 135   Potassium 4.5 3.7 3.8 3.4* 3.7   Chloride 102 101 98* 98* 98*   CO2 23 25 23 27 23    BUN 45* 31* 51* 32* 44*   Creatinine 3.9* 2.8* 3.7* 2.5* 3.6*   Glucose 196* 185* 164* 166* 145*   Calcium 9.0 8.9 8.6 8.2 8.2   Magnesium 1.9 1.8 1.7 1.7 1.8       Recent Labs   Lab 07/18/22  0421 07/16/22  0244 07/14/22  0435   ALT 12 11 11    AST (SGOT) 18 15 18    Bilirubin, Total 0.2 0.2 0.2   Albumin 1.8* 1.8* 1.8*   Alkaline Phosphatase 122* 129* 85             Recent Labs   Lab 07/14/22  0435   INR 1.1   PT 12.4   PTT 28         Microbiology Results (last 15 days)       Procedure Component Value Units Date/Time    Culture, Blood, Aerobic And Anaerobic [[id number]] Collected: 07/18/22 1400    Order Status: Resulted Specimen: Blood, Venous Updated: 07/18/22 1412    Culture, Blood, Aerobic And Anaerobic [[id number]] Collected: 07/18/22 1400    Order Status: Resulted Specimen: Blood, Venous Updated: 07/18/22 1416    Culture, Blood, Aerobic And Anaerobic [[id number]] Collected: 07/07/22 0355    Order Status: Completed Specimen: Blood, Venous Updated: 07/12/22 0700     Culture Blood No growth at 5 days    Culture, Blood, Aerobic And Anaerobic [[id number]] Collected: 07/06/22 1837    Order Status: Completed Specimen: Blood, Venous Updated: 07/12/22 0000     Culture Blood No growth at 5 days    Culture, Sputum and Lower Respiratory [[id number]]     Order Status: Sent Specimen: Sputum, Induced              RADIOLOGY     CT Abdomen Pelvis W IV And PO Cont    Result Date: 07/19/2022   1.Slight interval increase in left-sided retroperitoneal fluid collection with the other abdominal fluid collections appearing relatively stable. 2. There is no definite abscess or acute inflammatory process. 3. Incidental note is made of a small paraesophageal type hernia. 4. Sigmoid  diverticulosis is noted without CT evidence of diverticulitis. 5. Other findings as noted above. Miguel Dibble, MD 07/19/2022 6:36 AM    MRI Brain WO Contrast    Result Date: 07/18/2022   Interval evolution of multifocal recent infarcts in the bilateral cerebral hemispheres. No new infarct. Milan Waunita Schooner, MD 07/18/2022 5:57 PM    XR Chest AP Portable    Result Date: 07/17/2022  1.Near-complete resolution of interstitial edema. 2.Improving pulmonary vascular congestion. Legrand Pitts, MD 07/17/2022 2:38 PM    CT Head WO Contrast    Result Date: 07/17/2022   1.No new acute intracranial pathology. 2.There are multiple regions of cytotoxic edema in the right > left frontal and left > right occipital lobes, consistent with known early subacute infarcts. Dannielle Burn 07/17/2022 8:26 AM    US Carotid Duplex Dopp Comp Bilateral    Result Date: 07/15/2022   1. Plaque formation in the right internal carotid artery bulb with elevated velocities. Stenosis is estimated at 50-69%. 2. Plaque in the left internal carotid artery with normal velocities. Any stenosis is less than 50%. Criteria for stenosis and velocity parameters are based on the guidelines of the SRU (Society of Radiologists in Ultrasound). Grant et al: Carotid Artery Stenosis: Grayscale and Doppler Ultrasound Diagnosis-- Society of Radiologists in Ultrasound Consensus Conference Ultrasound Quarterly. Volume 19, Number 4; December, 2003. Recommendations updated October 2021 Verne Carrow is an [facility] accredited facility Suszanne Finch, MD 07/15/2022 9:41 AM    Tunneled Cath Removal (Permcath)    Result Date: 07/14/2022  1. Nonfunctioning tunneled dialysis catheter as described above. 2. Successful placement of a new 19 cm Equistream catheter as described above. Dimitrios Papadouris, MD 07/14/2022 4:06 PM    Tunneled Cath Placement (Permcath)    Result Date: 07/14/2022  1. Nonfunctioning tunneled dialysis catheter as described above. 2. Successful placement of a new 19 cm Equistream  catheter as described above. Dimitrios Papadouris, MD 07/14/2022 4:06 PM    MRI Brain WO Contrast    Result Date: 07/13/2022   1.Patchy areas of acute/recent infarction involving the cerebral hemispheres bilaterally. The infarcts have a configuration suggesting watershed territory infarcts. 2.Chronic ischemic changes as discussed. Georgann Housekeeper, MD 07/13/2022 10:28 PM    CT Head WO Contrast    Result Date: 07/13/2022   1.New region of loss of gray-white matter differentiation in the right frontal lobe, concerning for acute infarction. 2.A few additional areas of poor gray-white matter differentiation for example in the left occipital lobe may be artifactual but could be correlated with MRI. 3.No evidence of acute intracranial hemorrhage or significant mass effect. These urgent results were discussed with and acknowledged by Dr. Don Broach on 07/13/2022 3:20 PM. Vara Guardian, MD 07/13/2022 3:21 PM    XR Chest AP Portable    Result Date: 07/10/2022  Interstitial markings have slightly increased, suggesting minimal edema. No other change. Wilmon Pali, MD 07/10/2022 4:49 PM    XR Chest AP Portable    Result Date: 07/07/2022   There is mild interstitial prominence suggesting mild congestive heart failure/fluid overload in the setting of shortness of breath [name], MD 07/07/2022 12:32 PM    XR Chest AP Portable    Result Date: 07/06/2022   Hypoventilation with basilar atelectasis. Charlott Rakes, MD 07/06/2022 3:35 PM    CT Abdomen Pelvis W IV And PO Cont    Result Date: 07/06/2022  1.Postoperative changes from bilateral nephrectomy and cystectomy. 2.Small amount of ascites including some areas which appear loculated. Small collections of fluid  and gas in the anterior midline abdomen may be postoperative but superimposed infection is not excluded. No rim-enhancing fluid collections or extravasation of oral contrast to suggest anastomotic leak. Demetrios Isaacs, MD 07/06/2022 5:51 AM    CT Head WO Contrast    Result Date: 07/06/2022   1.No  acute intracranial abnormality is seen. Leandro Reasoner, MD 07/06/2022 3:56 AM    XR Abdomen AP    Result Date: 07/05/2022  Enteric tube terminates in the distal stomach. Elease Etienne, DO 07/05/2022 12:24 PM    XR Chest AP Portable    Result Date: 06/30/2022  1. Lines and tubes in adequate position. No pneumothorax. 2. Trace left pleural effusion and bibasilar atelectasis. Jasmine December D'Heureux, MD 06/30/2022 4:53 PM    XR Chest AP Portable    Result Date: 06/30/2022  Catheters as described. Pankaj Dominica, MD 06/30/2022 2:07 PM    XR Chest AP Portable    Result Date: 06/28/2022   Right mainstem bronchial intubation. The tube should be withdrawn about 4 cm. Dr. Tildon Husky is aware. Wynema Birch, MD 06/28/2022 4:56 PM    XR OR MISCOUNT    Result Date: 06/28/2022   No unexpected foreign body. These critical results were discussed with and acknowledged by Angie R., the circulating nurse, who will communicate the findings to Everardo All, MD on 06/28/2022 3:02 PM. Bosie Helper, MD 06/28/2022 3:03 PM   Echo Results       None          No results found for this or any previous visit.    Signed,  Gayleen Orem, MD  5:07 PM 07/19/2022

## 2022-07-19 NOTE — Plan of Care (Signed)
Problem: Moderate/High Fall Risk Score >5  Goal: Patient will remain free of falls  Outcome: Progressing  Flowsheets (Taken 07/19/2022 0000)  High (Greater than 13):   HIGH-Visual cue at entrance to patient's room   HIGH-Utilize chair pad alarm for patient while in the chair   HIGH-Bed alarm on at all times while patient in bed   HIGH-Apply yellow "Fall Risk" arm band   HIGH-Pharmacy to initiate evaluation and intervention per protocol   HIGH-Initiate use of floor mats as appropriate   HIGH-Consider use of low bed     Problem: Compromised Sensory Perception  Goal: Sensory Perception Interventions  Outcome: Progressing  Flowsheets (Taken 07/19/2022 0051)  Sensory Perception Interventions: Offload heels, Pad bony prominences, Reposition q 2hrs/turn Clock, Q2 hour skin assessment under devices if present     Problem: Compromised Moisture  Goal: Moisture level Interventions  Outcome: Progressing  Flowsheets (Taken 07/19/2022 0051)  Moisture level Interventions: Moisture wicking products, Moisture barrier cream     Problem: Compromised Activity/Mobility  Goal: Activity/Mobility Interventions  Outcome: Progressing  Flowsheets (Taken 07/19/2022 0051)  Activity/Mobility Interventions: Pad bony prominences, TAP Seated positioning system when OOB, Promote PMP, Reposition q 2 hrs / turn clock, Offload heels     Problem: Impaired Mobility  Goal: Mobility/Activity is maintained at optimal level for patient  Outcome: Progressing  Flowsheets (Taken 07/19/2022 0051)  Mobility/activity is maintained at optimal level for patient: Consult/collaborate with Physical Therapy and/or Occupational Therapy     Problem: Hemodynamic Status: Cardiac  Goal: Stable vital signs and fluid balance  Outcome: Progressing  Flowsheets (Taken 07/19/2022 0051)  Stable vital signs and fluid balance: Assess signs and symptoms associated with cardiac rhythm changes     Problem: Diabetes: Glucose Imbalance  Goal: Blood glucose stable at established goal  Outcome:  Progressing  Flowsheets (Taken 07/19/2022 0051)  Blood glucose stable at established goal:   Monitor lab values   Monitor intake and output.  Notify LIP if urine output is < 30 mL/hour.   Follow fluid restrictions/IV/PO parameters   Include patient/family in decisions related to nutrition/dietary selections   Assess for hypoglycemia /hyperglycemia   Coordinate medication administration with meals, as indicated   Monitor/assess vital signs     Problem: Neurological Deficit  Goal: Neurological status is stable or improving  Outcome: Progressing  Flowsheets (Taken 07/19/2022 0051)  Neurological status is stable or improving:   Monitor/assess/document neurological assessment (Stroke: every 4 hours)   Monitor/assess NIH Stroke Scale   Re-assess NIH Stroke Scale for any change in status   Observe for seizure activity and initiate seizure precautions if indicated   Perform CAM Assessment     Problem: Every Day - Stroke  Goal: Mobility/Activity is maintained at optimal level for patient  Outcome: Progressing  Flowsheets (Taken 07/19/2022 0051)  Mobility/activity is maintained at optimal level for patient: Consult/collaborate with Physical Therapy and/or Occupational Therapy  Goal: Neurological status is stable or improving  Outcome: Progressing  Flowsheets (Taken 07/19/2022 0051)  Neurological status is stable or improving:   Monitor/assess/document neurological assessment (Stroke: every 4 hours)   Monitor/assess NIH Stroke Scale   Re-assess NIH Stroke Scale for any change in status   Observe for seizure activity and initiate seizure precautions if indicated   Perform CAM Assessment  Goal: Stable vital signs and fluid balance  Outcome: Progressing  Flowsheets (Taken 07/19/2022 0051)  Stable vital signs and fluid balance:   Position patient for maximum circulation/cardiac output   Monitor and assess vitals every 4 hours  or as ordered and hemodynamic parameters   Monitor intake and output. Notify LIP if urine output is < 30  mL/hour.  Goal: Patient will maintain adequate oxygenation  Outcome: Progressing  Flowsheets (Taken 07/19/2022 0051)  Patient will maintain adequate oxygenation:   Suction secretions as needed   Maintain SpO2 of greater than 92%  Goal: Skin integrity is maintained or improved  Outcome: Progressing  Flowsheets (Taken 07/19/2022 0051)  Skin integrity is maintained or improved:   Assess Braden Scale every shift   Turn or reposition patient every 2 hours or as needed unless able to reposition self   Increase activity as tolerated/progressive mobility   Avoid shearing   Keep skin clean and dry   Relieve pressure to bony prominences  Goal: Neurovascular status is stable or improving  Outcome: Progressing  Flowsheets (Taken 07/19/2022 0051)  Neurovascular status is stable or improving: Monitor/assess neurovascular status (pulses, capillary refill, pain, paresthesia, presence of edema)  Goal: Will be able to express needs and understand communication  Outcome: Progressing  Flowsheets (Taken 07/19/2022 0051)  Able to express needs and understand communication: Provide alternative method of communication if needed

## 2022-07-19 NOTE — Progress Notes (Signed)
Nutritional Support Services  Nutrition Follow-up    PERSIA FIFIELD 82 y.o. female   MRN: [id number]    Summary of Nutrition Recommendations:    Continue Nepro via NDT @ 40 ml/hr + 2 pkg Prosource + 100 ml FW flushes Q4H  This rate at goal will provide 1888 kcal, 118 g protein, 698 ml fw (1298 ml w/ flushes) which meets 100% of kcal and protein needs  Monitor PO status/SLP assessments  Daily weights, standing scale if able     -----------------------------------------------------------------------------------------------------------------    Discussed during rounds                                                       ASSESSMENT DATA     Subjective Nutrition: Pt seen at bedside, alert to self. Dtr at bedside. Pt continues to be NPO, NGT is at two week mark, messaged SLP to reassess today for appropriateness of oral diet. TF are off d/t wound care, no pump hx to review at this time.     Learning Needs: N/a    Events of Current Admission: 82 yo female PMH COPD, CVA, DM, RCC s/p  partial bilateral nephrectomy, [name], and recent diagnosis of urothelial cancer. Patient initially admitted on 6/10 for scheduled cystectomy and complete nephrectomy. OR course was complicated by small bowel enterotomy and significant intraoperative bleeding (1 L EBL), patient was admitted to ICU in discontinuity with open abdomen. Patient went to the OR on 6/12 for small bowel anastomosis and closure with wound VAC placement. Patient weaned off pressors and extubated to nasal cannula pm 6/13. Initially patient required CRRT due to shock, decision to IHD on 6/15. Course was further complicated by delirium and encephalopathy. Patient downgraded out of ICU on 6/16. 6/25 weakness of left upper extremity and MRI showed areas of recent infarction involving the cerebral hemisphere bilaterally and watershed territory pattern. 6/29 patient is more lethargic, upgraded back to ICU for close monitoring     Medical Hx:  has a past medical history of  Acid reflux, Asthma, well controlled, Cancer of kidney, Chronic lower back pain, Congestive heart disease, COPD (chronic obstructive pulmonary disease), CVA (cerebral infarction) (01/12/2003), Diabetes, Diabetic nephropathy, Fibroids, Gout, H/O: gout, Hyperlipidemia, Hypertension, Neuropathy of hand, and SOB (shortness of breath).       Orders Placed This Encounter   Procedures    Diet NPO effective now    Tube feeding-Continuous       ANTHROPOMETRIC  Height: 157.5 cm (5\' 2" )  Weight: 86.7 kg (191 lb 2.2 oz)  Body mass index is 34.96 kg/m.     Weight History Summary Weight fluctuations likely 2/2 fluid and/or bed scale method     Weight Monitoring     Weight Weight Method   06/28/2022 87.09 kg  Standing Scale    06/29/2022 91.3 kg  Bed Scale    06/30/2022 96 kg  Bed Scale    07/01/2022 96.7 kg  Bed Scale    07/02/2022 97.6 kg  Bed Scale    07/03/2022 93 kg  Bed Scale    07/04/2022 87.8 kg  Bed Scale    07/05/2022 87.8 kg  Bed Scale    07/06/2022 93.6 kg  Bed Scale    07/07/2022 90.4 kg  Bed Scale    07/08/2022 93.2 kg  Bed Scale  07/09/2022 93.1 kg  Bed Scale    07/10/2022 93.2 kg  Bed Scale    07/11/2022 88.5 kg  Bed Scale    07/12/2022 87.8 kg  Bed Scale    07/13/2022 87.5 kg  Bed Scale    07/14/2022 84 kg     07/15/2022 88.3 kg  Bed Scale    07/16/2022 86.5 kg  Bed Scale    07/17/2022 96.9 kg  Bed Scale    07/19/2022 86.7 kg  Bed Scale        ESTIMATED NEEDS    Total Daily Energy Needs: 1566 to 2001 kcal  Method for Calculating Energy Needs: 18 kcal - 23 kcal per kg  at 87 kg (Actual body weight)  Rationale: obese, noncritical, HD       Total Daily Protein Needs: 104.4 to 130.5 g  Method for Calculating Protein Needs: 2 g - 2.5 g per kg at 52.2 kg (Ideal body weight)  Rationale: obese, noncritical, HD      Total Daily Fluid Needs: 1044 to 1305 ml  Method for Calculating Fluid Needs: 20 ml - 25 ml  per kg at 52.2 kg (Ideal body weight)  Rationale: bmi, age    Pertinent Medications:   Solu-cortef, SSI, PPI    Pertinent labs:  Recent  Labs   Lab 07/19/22  0216 07/18/22  0421 07/17/22  0431 07/16/22  0244 07/15/22  0427 07/14/22  0435   Sodium 139 139 135 134* 135 138   Potassium 4.5 3.7 3.8 3.4* 3.7 3.4*   Chloride 102 101 98* 98* 98* 100   CO2 23 25 23 27 23 27    BUN 45* 31* 51* 32* 44* 24*   Creatinine 3.9* 2.8* 3.7* 2.5* 3.6* 2.4*   Glucose 196* 185* 164* 166* 145* 116*   Calcium 9.0 8.9 8.6 8.2 8.2 8.4   Magnesium 1.9 1.8 1.7 1.7 1.8 1.8   Phosphorus 3.9 2.4 3.5 2.1* 3.4 2.9   GFR 10.8* 16.1* 11.5* 18.4* 11.9* 19.3*   WBC 13.86* 14.15* 15.03* 13.46* 11.90* 12.28*   Hematocrit 22.5* 24.5* 24.8* 24.3* 24.6* 23.5*   Hemoglobin 7.4* 7.8* 7.9* 7.9* 8.1* 7.7*   Triglycerides  --   --   --   --   --  185*   Hemoglobin A1C  --   --   --   --   --  5.6       Physical Assessment: 7/1 Decreased participation   Head: temple region: slight depression with decrease in muscle tone/resistance (mild muscle loss - temporalis), orbital region: slightly dark circles, somewhat hollow look, some decrease in bounce back of fat pads (mild fat loss), and buccal region: slight depression, somewhat sunken appearance, flat cheeks, decrease in bounce back of fat pads (moderate fat loss)  Upper Body: clavicle bone region: some protrusion of the clavicle with decrease in muscle tone/resistance (mild muscle loss - pectoralis major), shoulder and acromion bone region: slight protrusion of acromion process, decrease in muscle tone/resistance (moderate muscle loss - deltoid), and upper arm region: some fat in pinch between fingers, but not ample (mild fat loss)  Lower Body: anterior thigh and patellar region: slight depression along inner/outer thigh, patella slightly prominent, feel decrease in muscle tone/resistance in quadriceps to the patella (mild muscle loss - quadriceps) and posterior calf region: some shape to the bulb, but not well-developed, decrease in muscle tone/resistance (mild muscle loss - gastrocnemius)  Edema: edema: no sign of fluid accumulation   Skin:  abdominal incision  w/ wound vac  GI function: loose BM, no N/v                                                    NUTRITION DIAGNOSIS     Moderate Malnutrition related to inadequate nutritional intake in the setting of acute illness as evidenced by <50% EER > 5 days, mild muscle losses (temporalis, deltoid) - active                                                            INTERVENTION     Nutrition recommendation - Please refer to top of note                                                     MONITORING/EVALUATION     1. Goal:  Patient will meet >80% of nutritional needs via tube feeding by next RD follow up  - active    Nutrition Risk Level: High (will follow up at least 2 times per week and PRN)     Marisa Sprinkles, RD, CNSC  Dietitian Clinical Specialist I  [employer] [phone]

## 2022-07-19 NOTE — Consults (Signed)
Wound Ostomy Continence Progress Note/Follow Up    Date Time: 07/19/22 3:50 PM  Patient Name: Shelby Barron, Shelby Barron  Consulting Service: [facility] Day: 22         Assessment       Date of Operation:   06/28/2022     Providers Performing:   Surgeons and Role:  Panel 1:     * Neldon Newport, MD - Primary     * Adele Dan, MD - Resident - Assisting     * Marlaine Hind, MD - Assistant Surgeon     * Nonda Lou, MD  Panel 2:     Marlaine Hind, MD - Primary     Operative Procedure:   Panel 1  CYSTECTOMY, RADICAL: 16109 (CPT)  BILATERAL OPEN RADICAL NEPHRECTOMY AND LYSIS OF ASHESION:   Panel 2  RESECTION SMALL BOWEL:   ABTHERA WOUND VAC APPLICATION:   SEROSAL INJURY REPAIR     Wound Assessment/Interventions:     Wound Type surgical :     Location- mid abdomen  Measurement 26_cm x _7.5_cm x 6.5__cm  Characteristics  WoundBed: full  Thickness tissue loss.   Granulation- 30 %  Non granulation tissue- 50 %  Slough- 20 %  Eschar- % -   Periwound/Edges:  macerate and denuded on lower area   Drainage: Amount small  Drainage color-  serosanguinous noted on the cannister  Drainage odor- none  Undermining: no  Tunneling: none  Pain: Yes         VAC Change:  Vac Serial Number:   Type of Foam used: black simplace and white foam              Number of pieces used: 3 black and 2 white   Contact layer applied: silver non adherent  Continuous negative pressure setting: 125 mm Hg, low, intensity     Plan      Plan/Recommendations:     Wound care to mid abdomen     Area cleansed with wound cleanser/Vashe, patted dry.    Applied Cavilon No-Sting skin barrier film to peri-wound and allowed to dry.   Placed Vac drape around peri-wound window paned style, to prevent maceration and top with Cera ring for more adherent.   Packed two pieces of silver non adherent dressing and top with  two pieces of white foam and 2 pieces of of black foam into a wound  Secured with vac drape.   A quarter size cut hole was cut into the black foam  to fit the trackpad.   Good seal obtained and wound vacuum setting at 125 mm Hg, low intensity continuous.  An aseptic technique utilized seemed to tolerate it well without any pain medication.         1.  Wound care orders entered into EMR.   2.  Care rendered.  3.  RN checks wound VAC Q 2 hr: Sponge contracted;  pump "ON" and plugged in.    4.  If unable to maintain the seal, apply Tegaderm dressing or vac drape to seal. May also contact [facility] at 1 - [phone] to troubleshoot.  5.  If still unable to maintain the seal and vac off or not working for 2 hours, remove all foam and place hydrogel moistened gauze into wound bed, cover with ABD pad secured with tape, and notify Physician and WOC team.          6.  Change two- three times a week.  When patient discharged or VAC discontinued -   Remove all foam dressing and non adherent dressing and place hydrogel moistened gauze into the wound bed, cover with ABD pad tape to secure.   Wound Vac machine - discard canister and tubing.    Place wound vac in CLEAR plastic bag and place in the soiled utility room. DO NOT USE RED BIOHAZARD BAGS.    Please contact WOC office [phone] and leave a message for WOC to pick up.           Plan:     Discharge VAC Wound Information     Prescribed [facility] VAC Therapy for duration:  4 months      Discharge VAC Wound Information  Surgical   mid abdomen  Age of wound:   Did VAC initiate at the hospital?     yes         Initiated - 06/28/22  Compromised Nutritional status? no Nutrition consulted for wound care needs.  Other therapies previously tried: no.     Is there eschar present in the wound: no  Has wound been debrided in the last ten days:   fresh surgery     Are serial debridements required: no  Location: mid abdomen  Measurements: Date- 6/24//24 see above                            Length-   cm                            Width-  cm                            Depth- cm                            Undermining- no  cm                             Tunneling-  no cm  The appearance of wound bed and color: red, pink   Exudate: serosanguinous  Is wound full-thickness: yes  Is bone, tendon or muscle exposed: no  Is there undermining no  Is there tunneling no       Has the WOCN team attempted other dressings: no  WOCN assessed clinical reasons for the continuation of VAC at home:  Promotes wound healing, Speed granulation tissue, prevent infection          Initiate/ continue pressure prevention bundle:     Head of the bed 30 degrees or less  Positioning device to the bedside  Eliminate/minimize pressure from the area  Float heels with boots or pillows  Turn patient  Pressure redistribution cushion to the chair  Use lift sheets/low friction surface sheets for positioning.  Pad bony prominences  Nutrition consult/Optimize nutrition  Initiate bed algorithm/Specialty bed  Moisture/Incontinence management - Cleanse with incontinence cleansing wipe/water to manage incontinence and protect skin from exposure to urine and stool. Apply skin barrier protection cream. Apply [place]/external or female external urine management system to prevent urinary contamination of a wound. Apply rectal pouch/fecal management system per unit policy, to prevent fecal contamination of a wound or contain diarrhea.  Wound care orders entered into EMR. Care rendered. WOCN will follow.          Wound Photography:         Objective Findings   Populated from Epic Documentation:    Braden: Braden Scale Score: 11 (07/18/22 2000)  Braden Subscales:  Sensory Perceptions: Very limited (07/18/22 2000)  Moisture: Very moist (07/18/22 2000)  Activity: Bedfast (07/18/22 2000)  Mobility: Very limited (07/18/22 2000)  Nutrition: Probably inadequate (07/18/22 2000)  Friction and Shear: Potential problem (07/18/22 2000)    Ht Readings from Last 1 Encounters:   07/18/22 1.575 m (5\' 2" )     Wt Readings from Last 3 Encounters:   07/19/22 86.7 kg (191 lb 2.2 oz)   06/25/22 87.1 kg (192 lb)    06/23/22 84.8 kg (187 lb)     Body mass index is 34.96 kg/m.    Current Diet:   Supervise For Meals Frequency: All meals  Tube feeding-Continuous  Diet NPO effective now     History of Present Illness   This is a 82 y.o. female  has a past medical history of Acid reflux, Asthma, well controlled, Cancer of kidney, Chronic lower back pain, Congestive heart disease, COPD (chronic obstructive pulmonary disease), CVA (cerebral infarction) (01/12/2003), Diabetes, Diabetic nephropathy, Fibroids, Gout, H/O: gout, Hyperlipidemia, Hypertension, Neuropathy of hand, and SOB (shortness of breath)..  Admitted with Bladder cancer.      Past Surgical History:   Procedure Laterality Date    APPENDECTOMY (OPEN)      CLOSURE, ENTEROTOMY, SMALL INTESTINE  06/28/2022    Procedure: CLOSURE, ENTEROTOMY, SMALL INTESTINE;  Surgeon: Marlaine Hind, MD;  Location: [facility];  Service: General;;    CYSTECTOMY, ILEOCONDUIT N/A 06/28/2022    Procedure: CYSTECTOMY, RADICAL;  Surgeon: Neldon Newport, MD;  Location: [facility];  Service: Urology;  Laterality: N/A;    DRAIN (OTHER) N/A 05/31/2022    Procedure: DRAIN (OTHER);  Surgeon: Hope Pigeon, MD;  Location: [facility];  Service: Interventional Radiology;  Laterality: N/A;    EXPLORATORY LAPAROTOMY N/A 06/30/2022    Procedure: SMALL BOWEL ANASTOMOSIS, CLOSURE OF MESENTERIC DEFECT, CLOSURE OF ABDOMINAL WALL;  Surgeon: Marlaine Hind, MD;  Location: [facility];  Service: General;  Laterality: N/A;  **[id number]/ MD AVAIL [id number] OR AFTER 1600**    EXPLORATORY LAPAROTOMY, RESECTION SMALL BOWEL N/A 06/28/2022    Procedure: RESECTION SMALL BOWEL;  Surgeon: Marlaine Hind, MD;  Location: [facility];  Service: General;  Laterality: N/A;    HERNIA REPAIR      HYSTERECTOMY      LAPAROTOMY, NEPHRECTOMY Bilateral 06/28/2022    Procedure: BILATERAL OPEN RADICAL NEPHRECTOMY AND LYSIS OF ASHESION;  Surgeon: Neldon Newport, MD;  Location: [facility];  Service: Urology;  Laterality:  Bilateral;    PARTIAL NEPHRECTOMY      tumor removed from kidney  cJanuary 2015    TUNNELED CATH PLACEMENT (PERMCATH) N/A 06/01/2022    Procedure: TUNNELED CATH PLACEMENT;  Surgeon: Suszanne Finch, MD;  [facility]: [facility];  Service: Interventional Radiology;  Laterality: N/A;    TUNNELED CATH PLACEMENT (PERMCATH) Right 07/14/2022    Procedure: TUNNELED CATH PLACEMENT;  Surgeon: Hope Pigeon, MD;  [facility];  Service: Interventional Radiology;  Laterality: Right;  right IJ tunneled HD catheter placement    TUNNELED CATH REMOVAL (PERMCATH) Right 07/14/2022    Procedure: TUNNELED CATH REMOVAL;  Surgeon: Hope Pigeon, MD;  Location: [facility];  Service: Interventional Radiology;  Laterality:  Right;  right neck/chest tunneled HD catheter removal    TURBT      WOUND VAC APPLICATION N/A 06/28/2022    Procedure: Tawni Pummel WOUND VAC APPLICATION;  Surgeon: Marlaine Hind, MD;  Location: [facility];  Service: General;  Laterality: N/A;    WOUND VAC APPLICATION N/A 06/30/2022    Procedure: WOUND VAC APPLICATION;  Surgeon: Marlaine Hind, MD;  Location: [facility];  Service: General;  Laterality: N/A;       Procedure(s) with comments:  TUNNELED CATH REMOVAL - right neck/chest tunneled HD catheter removal    21 Days Post-Op  -------------------  **Canceled**    Procedure(s) with comments:  TUNNELED CATH REMOVAL - right neck/chest tunneled HD catheter removal    * No surgery date entered *  -------------------    Procedure(s) with comments:  TUNNELED CATH REMOVAL - right neck/chest tunneled HD catheter removal    19 Days Post-Op  -------------------    Procedure(s) with comments:  TUNNELED CATH REMOVAL - right neck/chest tunneled HD catheter removal    5 Days Post-Op  -------------------    Procedure(s) with comments:  TUNNELED CATH REMOVAL - right neck/chest tunneled HD catheter removal    5 Days Post-Op  -------------------      Lab   Significant Lab Values:  Recent Labs     07/19/22  0216   WBC 13.86*    RBC 2.47*   Hemoglobin 7.4*   Hematocrit 22.5*   Sodium 139   Potassium 4.5   Chloride 102   CO2 23   BUN 45*   Creatinine 3.9*   Calcium 9.0   GFR 10.8*   Glucose 196*           Meghin Thivierge "Sunshine" Katriel Cutsforth BSN, RN, WOCN  Wound, Ostomy, and Continence Nurse Coordinator  [facility]  [address], [place], [place] [address]  T [phone] S [phone]  [email or web address]

## 2022-07-19 NOTE — SLP Progress Note (Signed)
[facility] [place] [facility]  Speech Therapy Treatment Note    Patient:  Shelby Barron MRN#:  [id number]  Unit:  [facility] Room/Bed:  A2715/A2715-01      Time of treatment:   SLP Received On: 07/19/22  Start Time: 1227  Stop Time: 1250  Time Calculation (min): 23 min       Personal Protective Equipment (PPE)  Gloves and Procedure Mask with Shield    Subjective: Pt awake/verbal and seated in bedside chair. Family at bedside. Pt able to follow commands with mod-max cues. NGT in place.         Objective: Oropharyngeal analysis during PO trials in order to determine LRD,development and training of swallow strategies in order to improve swallow function and decrease risk of penetration/aspiration;  therapeutic feedings by SLP  and safe swallowing precautions education.       Assessment: Pt continues to present with mod oral pharyngeal dysphagia characterized by reduced mouth opening, poor coordination with bolus retrieval from spoon, delayed in a-p bolus transit, and delayed pharyngeal swallow. Pre-mature spillage is suspected. Pt with reduced HLE when consuming moderately thicken liquids via spoon with digital palpation. Pt required max verbal cues to swallow. Pt swallow initiation improved in 2/6 trials with cues. Throat clearing was observed in 3/8 trials with moderately thick liquids. Pt observed to demonstrate SOB during trials and provided a break. Further PO trials were discontinued.     Family and patient were educated on aspiration risk and precautions, along with reasoning for diet recommendation. Continue NPO with therapeutic trials with SLP.         Plan/Recommendations:  NPO , ice chips for oral comfort ONLY.      Charges Minutes   SLP treat    Swallow treat 23       07/19/2022  Claudie Revering., CCC-SLP  [id number] Language Pathology

## 2022-07-20 LAB — COMPREHENSIVE METABOLIC PANEL
ALT: 9 U/L (ref 0–55)
AST (SGOT): 10 U/L (ref 5–41)
Albumin/Globulin Ratio: 0.5 — ABNORMAL LOW (ref 0.9–2.2)
Albumin: 1.6 g/dL — ABNORMAL LOW (ref 3.5–5.0)
Alkaline Phosphatase: 97 U/L (ref 37–117)
Anion Gap: 13 (ref 5.0–15.0)
BUN: 57 mg/dL — ABNORMAL HIGH (ref 7–21)
Bilirubin, Total: 0.2 mg/dL (ref 0.2–1.2)
CO2: 22 mEq/L (ref 17–29)
Calcium: 9 mg/dL (ref 7.9–10.2)
Chloride: 100 mEq/L (ref 99–111)
Creatinine: 4.8 mg/dL — ABNORMAL HIGH (ref 0.4–1.0)
GFR: 8.4 mL/min/{1.73_m2} — ABNORMAL LOW (ref >=60.0)
Globulin: 3 g/dL (ref 2.0–3.6)
Glucose: 79 mg/dL (ref 70–100)
Potassium: 4.2 mEq/L (ref 3.5–5.3)
Protein, Total: 4.6 g/dL — ABNORMAL LOW (ref 6.0–8.3)
Sodium: 135 mEq/L (ref 135–145)

## 2022-07-20 LAB — LAB USE ONLY - CBC WITH DIFFERENTIAL
Absolute Basophils: 0.04 10*3/uL (ref 0.00–0.08)
Absolute Eosinophils: 0.05 10*3/uL (ref 0.00–0.44)
Absolute Immature Granulocytes: 0.2 10*3/uL — ABNORMAL HIGH (ref 0.00–0.07)
Absolute Lymphocytes: 2.16 10*3/uL (ref 0.42–3.22)
Absolute Monocytes: 0.97 10*3/uL — ABNORMAL HIGH (ref 0.21–0.85)
Absolute Neutrophils: 9.17 10*3/uL — ABNORMAL HIGH (ref 1.10–6.33)
Absolute nRBC: 0 10*3/uL (ref <=0.00)
Basophils %: 0.3 %
Eosinophils %: 0.4 %
Hematocrit: 21.6 % — ABNORMAL LOW (ref 34.7–43.7)
Hemoglobin: 7.2 g/dL — ABNORMAL LOW (ref 11.4–14.8)
Immature Granulocytes %: 1.6 %
Lymphocytes %: 17.2 %
MCH: 30.1 pg (ref 25.1–33.5)
MCHC: 33.3 g/dL (ref 31.5–35.8)
MCV: 90.4 fL (ref 78.0–96.0)
MPV: 10.5 fL (ref 8.9–12.5)
Monocytes %: 7.7 %
Neutrophils %: 72.8 %
Platelet Count: 403 10*3/uL — ABNORMAL HIGH (ref 142–346)
Preliminary Absolute Neutrophil Count: 9.17 10*3/uL — ABNORMAL HIGH (ref 1.10–6.33)
RBC: 2.39 10*6/uL — ABNORMAL LOW (ref 3.90–5.10)
RDW: 15 % (ref 11–15)
WBC: 12.59 10*3/uL — ABNORMAL HIGH (ref 3.10–9.50)
nRBC %: 0 /100 WBC (ref <=0.0)

## 2022-07-20 LAB — WHOLE BLOOD GLUCOSE POCT
Whole Blood Glucose POCT: 100 mg/dL (ref 70–100)
Whole Blood Glucose POCT: 72 mg/dL (ref 70–100)
Whole Blood Glucose POCT: 83 mg/dL (ref 70–100)
Whole Blood Glucose POCT: 88 mg/dL (ref 70–100)
Whole Blood Glucose POCT: 90 mg/dL (ref 70–100)

## 2022-07-20 LAB — MAGNESIUM: Magnesium: 2 mg/dL (ref 1.6–2.6)

## 2022-07-20 LAB — PHOSPHORUS: Phosphorus: 4.2 mg/dL (ref 2.3–4.7)

## 2022-07-20 MED ORDER — OXYCODONE HCL 5 MG PO TABS
2.5000 mg | ORAL_TABLET | Freq: Once | ORAL | Status: AC
Start: 2022-07-20 — End: 2022-07-20
  Administered 2022-07-20: 2.5 mg via ORAL
  Filled 2022-07-20: qty 1

## 2022-07-20 MED ORDER — OXYCODONE HCL 5 MG PO TABS
2.5000 mg | ORAL_TABLET | ORAL | Status: DC | PRN
Start: 2022-07-20 — End: 2022-08-11
  Administered 2022-07-21 – 2022-08-10 (×26): 2.5 mg via ORAL
  Filled 2022-07-20 (×26): qty 1

## 2022-07-20 NOTE — Progress Notes (Signed)
[facility]  PROGRESS NOTE      Patient: Shelby Barron  Date:07/08/2022   LOS: 22 Days  Admission Date: 06/28/2022   MRN: [id number]  Attending: Lennox Pippins, MD  When on service as the attending, please contact me on Epic Secure Chat from 7 AM - 7 PM for non-urgent issues. For urgent matters use XTend page from 7 AM - 7 PM.       ASSESSMENT/PLAN     Shelby Barron is a 82 y.o. female admitted with Bladder cancer    Interval Summary: [name], [name], type 2 diabetes, renal cell carcinoma status post partial bilateral nephrectomy, [name], new diagnosis of urothelial cancer initally presented on 6/10 for elective cystectomy and completion nephrectomy, [name], [name], extubated and off pressors since 6/13.     She is hypoactive and delirious. Surgery recommended NGT and start TF.  Has [name] T/Th/Sat. Nephrology following     Developed new onset afib RVR during HD. Stopped norvasc switched to metoprolol and gave additional IV metoprolol. Consulted cardiology. Discussed with ICU requested a consult given deterioration. Recommended repeat blood cultures and broadening antibiotics to meropenem. Palliative care consulted.    No further episodes of Afib RVR now on amiodarone gtt and PO metoprolol. Hgb stable. Repeating CXR, clear. Started robinul for upper airway secretions/rattling. Clinically improving.    Anticipated Discharge: Greater Than 48 Hours    Barriers to Discharge: Clinical improvement    Family/Social/Case Mgr Assistance: Patient might end up going to rehab  Patient Active Hospital Problem List:  #Encephalopathy/delirium,   Worsening of lethargy likely toxic metabolic  Patient was upgraded to ICU on 6/30 for lethargy and decreased responsiveness  CT head was repeated on 6/29, no acute finding  ABG consistent with respiratory alkalosis  EEG consistent with  encephalopathy no seizure, MRI no acute stroke  Culture pending  CT abdomen with and without contrast after premedication did not show any leak or abscess  Continue supportive care  - Failed SLP eval . Wcont tube feeds per nutrition recs. SLP advanced from NPO to clear liquid mildly thick  - Palliative consulted:  Family is clear they would like no limits to care and patient should remain full code.    #S/P Cystectomy and completion nephrectomy complicated by small bowel enterotomy   #Mixed shock - hemorrhagic vs septic: Resolved  #Leukocytosis  - 6/10 small bowel enteretomy with 1L EBL. Went to the OR on June 12 for small bowel anastomosis, closure of mesenteric defect, closure of abdominal wall with wound VAC application. Wound VAC changed 6/15  - Surgery consulted, recommend CTAP with PO contrast that showed no leak or enhancing fluid collection  - ID consulted, Continue meropenem and Mycamine-completing 2 weeks on 7/3    #Acute CVA on the right frontal lobe  #History of CVA  - Hold home plavix  -Left upper extremity not moving at all unclear duration  -CT head showed right frontal infarction  - Neurology consulted, started ASA 81mg  daily cleared by GI. Long term would likely benefit from anticoagulation in setting of new onset A fib but would favor waiting 10 days from onset due to size of infarcts and potential for hemorrhagic conversion which is 07/23/2022  - SBP goal 120-160  -Carotid Doppler showed plaque with no critical stenosis  -Repeat head CT done on  6/29 for lethargy showed no acute finding  -EEG encephalopathic pattern no seizure, repeat MRI no new infarction  -Continue reorientation, PT OT recommend SNF    #Acute hypoxemic respiratory failure  #COPD  Extubated on 6/13, currently on room air  Maintain SaO2 > 92%  Continue as needed neb treatment  X-ray repeated on 6/29 showed resolution of interstitial edema    #ESRD  HD Tues/Thurs/Sat  Continue dialysis per nephrology  Nephrology following  along    #Nonfunctioning right permacath  #Right Quinton IJ cath  IR removed the nonfunctional permacath and right Quinton IJ cath and replaced with a new permacath on 6/26.    #HTN  On metoprolol   PRN IV labetalol for SBP > 180     New onset Afib with RVR  - cardiology following  -CHA2DS2-VASc score is 7, 11% yearly risk of stroke  - stable now on amiodarone gtt and metoprolol, cardiology recommended to switch amiodarone to 200 mg daily on 07/16/2022  -GI and general surgery cleared for anticoagulation  -Neurology recommended to hold anticoagulation for 10 days [till 07/23/2022] given the size of stroke      #Renal cell carcinoma  #Urothelial carcinoma  #Thrombocytopenia  - VTE ppx: SCDs, subcutaneous heparin     #Diabetes Mellitus type 2  -Sliding scale insulin          Nutrition: NPO, tube feeds  Malnutrition Documentation    Moderate Malnutrition related to inadequate nutritional intake in the setting of acute illness as evidenced by <50% EER > 5 days, mild muscle losses (temporalis, deltoid) - new                 Patient has BMI=Body mass index is 36.45 kg/m.  Diagnosis: Obesity based on BMI criteria                                                 Recent Labs   Lab 07/20/22  0414 07/19/22  0216 07/18/22  0421   Hemoglobin 7.2* 7.4* 7.8*   Hematocrit 21.6* 22.5* 24.5*   MCV 90.4 91.1 92.1   WBC 12.59* 13.86* 14.15*   Platelet Count 403* 354* 323     Recent Labs   Lab 07/18/22  0421 06/02/22  0514   Ferritin 2,300.21*  --    Iron 18* 62   TIBC 99* 145*   Iron Saturation 18 43       Code Status: Full Code    Dispo: pending improved mental status    Family Contact: Updated daughter    DVT Prophylaxis:   Current Facility-Administered Medications (Includes Only Anticoagulants, Misc. Hematological)   Medication Dose Route Last Admin    heparin (porcine) injection 5,000 Units  5,000 Units Subcutaneous 5,000 Units at 07/19/22 2011          CHART  REVIEW & DISCUSSION     The following chart items were reviewed as of 9:44  AM on 07/20/22:  [x]  Lab Results [x]  Imaging Results   [x]  Problem List  [x]  Current Orders [x]  Current Medications  [x]  Allergies  []  Code Status []  Previous Notes   []  SDoH    The management and plan of care for this patient was discussed with the following specialty consultants:  [x]  Cardiology  [x]  Gastroenterology                 []   Infectious Disease  []  Pulmonology [x]  Neurology                [x]  Nephrology  []  Neurosurgery []  Orthopedic Surgery  [x]  ICU []  General Surgery []  Psychiatry                                   [x]  Palliative    SUBJECTIVE     [name]   Seen in dialysis  Aox1, sleepy but arousable with stimulation    MEDICATIONS     Current Facility-Administered Medications   Medication Dose Route Frequency    acetaminophen  650 mg Oral 4 times per day    Or    acetaminophen  650 mg Oral 4 times per day    amiodarone  200 mg per NG tube Daily    aspirin  81 mg per NG tube Daily    carvedilol  12.5 mg Oral Q12H SCH    darbepoetin alfa  0.45 mcg/kg Subcutaneous Weekly    glycopyrrolate  0.2 mg Intravenous Q8H    heparin (porcine)  5,000 Units Subcutaneous Q12H SCH    insulin glargine  7 Units Subcutaneous QAM    insulin lispro  1-8 Units Subcutaneous Q6H    meropenem  500 mg Intravenous Q24H    micafungin  100 mg Intravenous Q24H    pantoprazole  40 mg Intravenous BID    thiamine  100 mg Intravenous Q24H SCH       PHYSICAL EXAM     Vitals:    07/20/22 0930   BP: 126/60   Pulse: 68   Resp: (!) 29   Temp: 98.3 F (36.8 C)   SpO2: 96%       Temperature: Temp  Min: 97.2 F (36.2 C)  Max: 98.5 F (36.9 C)  Pulse: Pulse  Min: 62  Max: 81  Respiratory: Resp  Min: 18  Max: 42  Non-Invasive BP: BP  Min: 118/66  Max: 186/77  Pulse Oximetry SpO2  Min: 95 %  Max: 99 %    Intake and Output Summary (Last 24 hours) at Date Time    Intake/Output Summary (Last 24 hours) at 07/20/2022 0944  Last data filed at 07/20/2022 0600  Gross per 24 hour   Intake 165 ml   Output 112 ml   Net 53 ml           GEN APPEARANCE:  Normal; more alert, oriented x 1  HEENT: PERLA; EOMI; Conjunctiva Clear  NECK: Supple; No bruits  CVS: RRR, S1, S2; No M/G/R  LUNGS: CTAB; wheezing improved, no Rhonchi: No rales  ABD: Soft; No TTP; + Normoactive BS  EXT: No edema; Pulses 2+ and intact  NEURO: moving right upper extremity spontaneously, able to move lower extremities for pain but minimal, no movement at all on the left upper extremity  MENTAL STATUS: Lethargic, oriented x 1, able to follow simple commands    LABS     Recent Labs   Lab 07/20/22  0414 07/19/22  0216 07/18/22  0421   WBC 12.59* 13.86* 14.15*   RBC 2.39* 2.47* 2.66*   Hemoglobin 7.2* 7.4* 7.8*   Hematocrit 21.6* 22.5* 24.5*   MCV 90.4 91.1 92.1   Platelet Count 403* 354* 323       Recent Labs   Lab 07/20/22  0414 07/19/22  0216 07/18/22  0421 07/17/22  0431 07/16/22  0244  Sodium 135 139 139 135 134*   Potassium 4.2 4.5 3.7 3.8 3.4*   Chloride 100 102 101 98* 98*   CO2 22 23 25 23 27    BUN 57* 45* 31* 51* 32*   Creatinine 4.8* 3.9* 2.8* 3.7* 2.5*   Glucose 79 196* 185* 164* 166*   Calcium 9.0 9.0 8.9 8.6 8.2   Magnesium 2.0 1.9 1.8 1.7 1.7       Recent Labs   Lab 07/20/22  0414 07/18/22  0421 07/16/22  0244   ALT 9 12 11    AST (SGOT) 10 18 15    Bilirubin, Total 0.2 0.2 0.2   Albumin 1.6* 1.8* 1.8*   Alkaline Phosphatase 97 122* 129*             Recent Labs   Lab 07/14/22  0435   INR 1.1   PT 12.4   PTT 28         Microbiology Results (last 15 days)       Procedure Component Value Units Date/Time    Culture, Blood, Aerobic And Anaerobic [[id number]] Collected: 07/18/22 1400    Order Status: Completed Specimen: Blood, Venous Updated: 07/19/22 2200     Culture Blood No growth at 1 day    Culture, Blood, Aerobic And Anaerobic [[id number]] Collected: 07/18/22 1400    Order Status: Completed Specimen: Blood, Venous Updated: 07/19/22 2200     Culture Blood No growth at 1 day    Culture, Blood, Aerobic And Anaerobic [[id number]] Collected: 07/07/22 0355    Order Status: Completed Specimen: Blood,  Venous Updated: 07/12/22 0700     Culture Blood No growth at 5 days    Culture, Blood, Aerobic And Anaerobic [[id number]] Collected: 07/06/22 1837    Order Status: Completed Specimen: Blood, Venous Updated: 07/12/22 0000     Culture Blood No growth at 5 days    Culture, Sputum and Lower Respiratory [[id number]]     Order Status: Sent Specimen: Sputum, Induced              RADIOLOGY     CT Abdomen Pelvis W IV And PO Cont    Result Date: 07/19/2022   1.Slight interval increase in left-sided retroperitoneal fluid collection with the other abdominal fluid collections appearing relatively stable. 2. There is no definite abscess or acute inflammatory process. 3. Incidental note is made of a small paraesophageal type hernia. 4. Sigmoid diverticulosis is noted without CT evidence of diverticulitis. 5. Other findings as noted above. Miguel Dibble, MD 07/19/2022 6:36 AM    MRI Brain WO Contrast    Result Date: 07/18/2022   Interval evolution of multifocal recent infarcts in the bilateral cerebral hemispheres. No new infarct. Milan Waunita Schooner, MD 07/18/2022 5:57 PM    XR Chest AP Portable    Result Date: 07/17/2022  1.Near-complete resolution of interstitial edema. 2.Improving pulmonary vascular congestion. Legrand Pitts, MD 07/17/2022 2:38 PM    CT Head WO Contrast    Result Date: 07/17/2022   1.No new acute intracranial pathology. 2.There are multiple regions of cytotoxic edema in the right > left frontal and left > right occipital lobes, consistent with known early subacute infarcts. Dannielle Burn 07/17/2022 8:26 AM    US Carotid Duplex Dopp Comp Bilateral    Result Date: 07/15/2022   1. Plaque formation in the right internal carotid artery bulb with elevated velocities. Stenosis is estimated at 50-69%. 2. Plaque in the left internal carotid artery with normal velocities. Any stenosis is less than  50%. Criteria for stenosis and velocity parameters are based on the guidelines of the SRU (Society of Radiologists in Ultrasound). Grant et  al: Carotid Artery Stenosis: Grayscale and Doppler Ultrasound Diagnosis-- Society of Radiologists in Ultrasound Consensus Conference Ultrasound Quarterly. Volume 19, Number 4; December, 2003. Recommendations updated October 2021 Verne Carrow is an [facility] accredited facility Suszanne Finch, MD 07/15/2022 9:41 AM    Tunneled Cath Removal (Permcath)    Result Date: 07/14/2022  1. Nonfunctioning tunneled dialysis catheter as described above. 2. Successful placement of a new 19 cm Equistream catheter as described above. Dimitrios Papadouris, MD 07/14/2022 4:06 PM    Tunneled Cath Placement (Permcath)    Result Date: 07/14/2022  1. Nonfunctioning tunneled dialysis catheter as described above. 2. Successful placement of a new 19 cm Equistream catheter as described above. Dimitrios Papadouris, MD 07/14/2022 4:06 PM    MRI Brain WO Contrast    Result Date: 07/13/2022   1.Patchy areas of acute/recent infarction involving the cerebral hemispheres bilaterally. The infarcts have a configuration suggesting watershed territory infarcts. 2.Chronic ischemic changes as discussed. Georgann Housekeeper, MD 07/13/2022 10:28 PM    CT Head WO Contrast    Result Date: 07/13/2022   1.New region of loss of gray-white matter differentiation in the right frontal lobe, concerning for acute infarction. 2.A few additional areas of poor gray-white matter differentiation for example in the left occipital lobe may be artifactual but could be correlated with MRI. 3.No evidence of acute intracranial hemorrhage or significant mass effect. These urgent results were discussed with and acknowledged by Dr. Don Broach on 07/13/2022 3:20 PM. Vara Guardian, MD 07/13/2022 3:21 PM    XR Chest AP Portable    Result Date: 07/10/2022  Interstitial markings have slightly increased, suggesting minimal edema. No other change. Wilmon Pali, MD 07/10/2022 4:49 PM    XR Chest AP Portable    Result Date: 07/07/2022   There is mild interstitial prominence suggesting mild congestive heart failure/fluid  overload in the setting of shortness of breath [name], MD 07/07/2022 12:32 PM    XR Chest AP Portable    Result Date: 07/06/2022   Hypoventilation with basilar atelectasis. Charlott Rakes, MD 07/06/2022 3:35 PM    CT Abdomen Pelvis W IV And PO Cont    Result Date: 07/06/2022  1.Postoperative changes from bilateral nephrectomy and cystectomy. 2.Small amount of ascites including some areas which appear loculated. Small collections of fluid and gas in the anterior midline abdomen may be postoperative but superimposed infection is not excluded. No rim-enhancing fluid collections or extravasation of oral contrast to suggest anastomotic leak. Demetrios Isaacs, MD 07/06/2022 5:51 AM    CT Head WO Contrast    Result Date: 07/06/2022   1.No acute intracranial abnormality is seen. Leandro Reasoner, MD 07/06/2022 3:56 AM    XR Abdomen AP    Result Date: 07/05/2022  Enteric tube terminates in the distal stomach. Elease Etienne, DO 07/05/2022 12:24 PM    XR Chest AP Portable    Result Date: 06/30/2022  1. Lines and tubes in adequate position. No pneumothorax. 2. Trace left pleural effusion and bibasilar atelectasis. Jasmine December D'Heureux, MD 06/30/2022 4:53 PM    XR Chest AP Portable    Result Date: 06/30/2022  Catheters as described. Pankaj Dominica, MD 06/30/2022 2:07 PM    XR Chest AP Portable    Result Date: 06/28/2022   Right mainstem bronchial intubation. The tube should be withdrawn about 4 cm. Dr. Tildon Husky is aware. Wynema Birch, MD 06/28/2022 4:56 PM  XR OR MISCOUNT    Result Date: 06/28/2022   No unexpected foreign body. These critical results were discussed with and acknowledged by Angie R., the circulating nurse, who will communicate the findings to Everardo All, MD on 06/28/2022 3:02 PM. Bosie Helper, MD 06/28/2022 3:03 PM   Echo Results       None          No results found for this or any previous visit.    Signed,  Lennox Pippins, MD  9:44 AM 07/20/2022

## 2022-07-20 NOTE — Progress Notes (Addendum)
Hd tx started via R CVC, C/D/I, VS WDL, pt open eyes & responsive when called, wound vac noted, room air sats 98%, Report received from [facility].   07/20/22 0930   Bedside Nurse Communication   Name of bedside RN - pre dialysis Orbie Pyo RN   Treatment Initiation- With Dialysis Precautions   Time Out/Safety Check Completed Yes   Consent for HD signed for this hospitalization (Date) 06/01/22   Consent for HD signed for this hospitalization (Time) 1516   Preferred language No   Blood Consent Verified N/A   Dialysis Precautions All Connections Secured   Dialysis Treatment Type Routine   Is patient diabetic? Yes   RO/Hemodialysis Cabin crew   Is Total Chlorine less than 0.1 ppm? Yes   Orignial Total Chlorine Testing Time 0930   At 4 Hour Total Chlorine Testing Time 1330   RO/Hemodialysis Arts development officer Number 6    Machine Serial Number [id number]   RO # 27   RO Serial # [id number]   Water Hardness NA   pH 7.2   Pressure Test Verified Yes   Alarms Verified Passed   Machine Temperature 97.7 F (36.5 C)   Alarms Verified Yes   Na+ mEq (Machine) 138 mEq   Bicarb mEq (Machine) 35 mEq   Hemodialysis Conductivity (Machine) 13.6   Hemodialysis Conductivity (Meter) 13.6   Dialyzer Lot Number [id number]   Tubing Lot Number [id number]   RO Machine Log Completed Yes   Hepatitis Status   HBsAg (Antigen) Result Negative   HBsAg Date Drawn 07/03/22   HBsAg Repeat Draw Due Date 07/31/22   Dialysis Weight   Pre-Treatment Weight (Kg) 90.2   Scale Type ICU Bed Scale   Vitals   Temp 98.1   Heart Rate 68   Resp Rate (!) 29   BP 126/60   SpO2 96 %   O2 Device None (Room air)   Assessment   Mental Status Cooperative;Lethargic   Cardiac (WDL) X   Cardiac Regularity Irregular   Cardiac Symptoms Return to [facility]   Cardiac Rhythm Normal sinus rhythm   Respiratory  WDL   Respiratory Pattern Return to WDL   R Breath Sounds Diminished   L Breath Sounds Diminished   Cough Spontaneous   Edema  WDL   Generalized Edema Non Pitting  Edema   RLE Edema +1   LLE Edema +1   General Skin Color Appropriate for ethnicity   Skin Condition/Temp Cool   Gastrointestinal (WDL) X   Abdomen Inspection   (WOUND VAC)   Mobility Bed   Permacath Catheter - Tunneled 07/14/22 Subclavian vein catheter Right   Placement Date/Time: 07/14/22 1404   Inserted by: Dr. Welton Flakes  Access Type: Subclavian vein catheter  Orientation: Right  Central Line Infection Prevention Education provided?: Yes  Hand Hygiene: Alcohol based hand scrub;Soap and water  Line cart u...   Line necessity reviewed? Apheresis/hemodialysis   Site Assessment Clean;Dry;Intact   Catheter Lumen Volume Venous 1.6 mL   Catheter Lumen Volume Arterial 1.6 mL   Dressing Status and Intervention Dressing Intact;Clean & Dry   Tego/Curos Caps on Catheter Yes   NEW Tego/Curos Caps placed (Date) 07/20/22   APHERESIS ONLY:  Access  Red   APHERESIS ONLY: Return Blue   End Caps Free From Blood Yes   Line Care Connections checked and tightened   Dressing Type Transparent;Biopatch   Dressing Status Clean;Dry;Intact   Dressing/Line Intervention Caps changed   Pain Assessment  Charting Type Assessment   Pain Scale Used CPOT   Numeric Pain Scale   Pain Score 0   POSS Score 2   Multiple Pain Sites No   Hemodialysis Comments   Pre-Hemodialysis Comments timeout & safety checks done

## 2022-07-20 NOTE — Progress Notes (Signed)
Nutritional Support Services  Nutrition Follow-up    Shelby Barron 82 y.o. female   MRN: [id number]    Summary of Nutrition Recommendations:    Continue Nepro via NDT @ 40 ml/hr + 2 pkg Prosource + 100 ml FW flushes Q4H  This rate at goal will provide 1888 kcal, 118 g protein, 698 ml fw (1298 ml w/ flushes) which meets 100% of kcal and protein needs  Monitor PO status/SLP assessments  Daily weights, standing scale if able   -----------------------------------------------------------------------------------------------------------------                                                           ASSESSMENT DATA     Subjective Nutrition: Pt seen at bedside just returned from HD. Discussed pts nutritional progress with RN, whom states that speech just came by and said that pt is doing much better. Plan to allow pleasure feeds for now with clear liquids,  advancing diet as tolerated. TF to be resumed today, plan to continue feeding until adequacy/ tolerance to PO can be established. Noted that TF has been off since previous assessment d/t RN miscommunication and HD. No pump hx available. Drop in glucose noted.     Medical Hx:  has a past medical history of Acid reflux, Asthma, well controlled, Cancer of kidney, Chronic lower back pain, Congestive heart disease, COPD (chronic obstructive pulmonary disease), CVA (cerebral infarction) (01/12/2003), Diabetes, Diabetic nephropathy, Fibroids, Gout, H/O: gout, Hyperlipidemia, Hypertension, Neuropathy of hand, and SOB (shortness of breath).       Orders Placed This Encounter   Procedures    Diet NPO effective now    Tube feeding-Continuous       ANTHROPOMETRIC  Height: 157.5 cm (5\' 2" )  Weight: 90.4 kg (199 lb 4.7 oz)     Weight Change: 4.27     Body mass index is 36.45 kg/m.         ESTIMATED NEEDS    Total Daily Energy Needs: 1566 to 2001 kcal  Method for Calculating Energy Needs: 18 kcal - 23 kcal per kg  at 87 kg (Actual body weight)  Rationale: obese, noncritical, HD        Total Daily Protein Needs: 104.4 to 130.5 g  Method for Calculating Protein Needs: 2 g - 2.5 g per kg at 52.2 kg (Ideal body weight)  Rationale: obese, noncritical, HD      Total Daily Fluid Needs: 1044 to 1305 ml  Method for Calculating Fluid Needs: 20 ml - 25 ml  per kg at 52.2 kg (Ideal body weight)  Rationale: bmi, age    Pertinent Medications:   IVF:      Pertinent labs:  Recent Labs   Lab 07/20/22  0414 07/19/22  0216 07/18/22  0421 07/17/22  0431 07/16/22  0244 07/15/22  0427 07/14/22  0435   Sodium 135 139 139 135 134*  More results in Results Review 138   Potassium 4.2 4.5 3.7 3.8 3.4*  More results in Results Review 3.4*   Chloride 100 102 101 98* 98*  More results in Results Review 100   CO2 22 23 25 23 27   More results in Results Review 27   BUN 57* 45* 31* 51* 32*  More results in Results Review 24*   Creatinine 4.8* 3.9* 2.8* 3.7*  2.5*  More results in Results Review 2.4*   Glucose 79 196* 185* 164* 166*  More results in Results Review 116*   Calcium 9.0 9.0 8.9 8.6 8.2  More results in Results Review 8.4   Magnesium 2.0 1.9 1.8 1.7 1.7  More results in Results Review 1.8   Phosphorus 4.2 3.9 2.4 3.5 2.1*  More results in Results Review 2.9   GFR 8.4* 10.8* 16.1* 11.5* 18.4*  More results in Results Review 19.3*   WBC 12.59* 13.86* 14.15* 15.03* 13.46*  More results in Results Review 12.28*   Hematocrit 21.6* 22.5* 24.5* 24.8* 24.3*  More results in Results Review 23.5*   Hemoglobin 7.2* 7.4* 7.8* 7.9* 7.9*  More results in Results Review 7.7*   Triglycerides  --   --   --   --   --   --  185*   Hemoglobin A1C  --   --   --   --   --   --  5.6   More results in Results Review = values in this interval not displayed.                                                        MONITORING/EVALUATION   Monitor PO intake, weights, labs, GI function, medical treatment plan    Goal: Patient will meet >80% of nutritional needs via tube feeding by next RD follow up - active       Nutrition Risk Level: High (will  follow up at least 2 times per week and PRN)       Levin Erp, RDN  Clinical Dietitian PRN

## 2022-07-20 NOTE — Plan of Care (Signed)
Problem: Impaired Mobility  Goal: Mobility/Activity is maintained at optimal level for patient  Outcome: Progressing  Flowsheets (Taken 07/20/2022 2218)  Mobility/activity is maintained at optimal level for patient:   Encourage independent activity per ability   Consult/collaborate with Physical Therapy and/or Occupational Therapy     Problem: Constipation  Goal: Fluid and electrolyte balance are achieved/maintained  Outcome: Progressing  Flowsheets (Taken 07/20/2022 2218)  Fluid and electrolyte balance are achieved/maintained:   Monitor/assess lab values and report abnormal values   Observe for cardiac arrhythmias   Monitor for muscle weakness   Assess and reassess fluid and electrolyte status  Goal: Elimination patterns are normal or improving  Outcome: Progressing  Flowsheets (Taken 07/20/2022 2218)  Elimination patterns are normal or improving: Assess for and discuss C. diff screening with LIP     Problem: Renal Instability  Goal: Fluid and electrolyte balance are achieved/maintained  Outcome: Progressing  Flowsheets (Taken 07/20/2022 2218)  Fluid and electrolyte balance are achieved/maintained:   Monitor/assess lab values and report abnormal values   Assess and reassess fluid and electrolyte status   Monitor for muscle weakness   Observe for cardiac arrhythmias   Follow fluid restrictions/IV/PO parameters     Problem: Patient Receiving Advanced Renal Therapies  Goal: Therapy access site remains intact  Outcome: Progressing  Flowsheets (Taken 07/20/2022 2218)  Therapy access site remains intact: Assess therapy access site     Problem: Fluid and Electrolyte Imbalance/ Endocrine  Goal: Fluid and electrolyte balance are achieved/maintained  Outcome: Progressing  Flowsheets (Taken 07/20/2022 2218)  Fluid and electrolyte balance are achieved/maintained:   Monitor/assess lab values and report abnormal values   Observe for cardiac arrhythmias   Monitor for muscle weakness   Assess and reassess fluid and electrolyte status      Problem: Neurological Deficit  Goal: Neurological status is stable or improving  Outcome: Progressing  Flowsheets (Taken 07/20/2022 2218)  Neurological status is stable or improving:   Monitor/assess NIH Stroke Scale   Monitor/assess/document neurological assessment (Stroke: every 4 hours)   Re-assess NIH Stroke Scale for any change in status   Observe for seizure activity and initiate seizure precautions if indicated   Perform CAM Assessment     Problem: Every Day - Stroke  Goal: Mobility/Activity is maintained at optimal level for patient  Outcome: Progressing  Flowsheets (Taken 07/20/2022 2218)  Mobility/activity is maintained at optimal level for patient:   Encourage independent activity per ability   Consult/collaborate with Physical Therapy and/or Occupational Therapy  Goal: Elimination patterns are normal or improving  Outcome: Progressing  Flowsheets (Taken 07/20/2022 2218)  Elimination patterns are normal or improving: Assess for and discuss C. diff screening with LIP  Goal: Neurological status is stable or improving  Outcome: Progressing  Flowsheets (Taken 07/20/2022 2218)  Neurological status is stable or improving:   Monitor/assess NIH Stroke Scale   Monitor/assess/document neurological assessment (Stroke: every 4 hours)   Re-assess NIH Stroke Scale for any change in status   Observe for seizure activity and initiate seizure precautions if indicated   Perform CAM Assessment

## 2022-07-20 NOTE — SLP Progress Note (Signed)
[facility]  Speech Therapy Treatment Note    Patient:  DANIELLE GAMBINO MRN#:  [id number]  Unit:  [facility] Room/Bed:  A2702/A2702-01      Time of treatment:   SLP Received On: 07/20/22  Start Time: 1340  Stop Time: 1400  Time Calculation (min): 20 min       Personal Protective Equipment (PPE)  Gloves and Procedure Mask with Shield    Subjective:       Pt is drowsy initially but able to follow some commands with max prompt     Objective: Oropharyngeal analysis during PO trials in order to determine LRD,development and training of swallow strategies in order to improve swallow function and decrease risk of penetration/aspiration;  therapeutic feedings by SLP  and safe swallowing precautions education.       Assessment: pt demonstrated some improvement in swallow function. Pt presents with suspect mod pharyngeal dysphagia characterized by delay in swallow trigger with great variability .  Delay in swallow worsened with pureed trial. Pt tolerated thickened liquid via spoon without overt s/s aspiration or penetration.         Plan/Recommendations:  clear liquid   and Mildly thick  MT2  Slow feeding rate, small bites / sips , upright position during and after the meals , Reflux precautions , full assistance with meals , and check oral cavity for residue         Charges Minutes   SLP treat    Swallow treat 20       07/20/2022  Presley Raddle MS [facility] SLP   07/20/2022 3:26 PM  [phone]

## 2022-07-20 NOTE — Plan of Care (Signed)
NURSING SHIFT NOTE     Patient: Shelby Barron  Day: 22      SHIFT EVENTS     Shift Narrative/Significant Events (PRN med administration, fall, RRT, etc.):     Pt drowsy and oriented to self only, Sinus brady with intermittent PVC on tele, and stable at this time.    Neurological checks obtained Q 4 hour and NIHHS obtained. Pt underwent SLP evaluation and fed clear liquids by daughter. Pt tolerated 25% of meal well.  NG tube initiated with permission from hospitalist and surgeon.    Pt underwent dialysis this shift.    Tylenol given for pain throughout shift. MD recommended not giving pain meds that may cause increase delirium, such as narcotics.    Safety and fall precautions remain in place. Purposeful rounding completed.          ASSESSMENT     Changes in assessment from patient's baseline this shift:    Neuro: No  CV: No  Pulm: No  Peripheral Vascular: No  HEENT: No  GI: No  BM during shift: Yes   , Last BM: Last BM Date: 07/20/22  GU: No   Integ: No  MS: No    Pain: No change  Pain Interventions: Medications  Medications Utilized: Tylenol    Mobility: PMP Activity: Step 1 - Bedrest of Distance Walked (ft) (Step 6,7): 0 Feet           Lines     Patient Lines/Drains/Airways Status       Active Lines, Drains and Airways       Name Placement date Placement time Site Days    Permacath Catheter - Tunneled 07/14/22 Subclavian vein catheter Right 07/14/22  1404  -- 6    Midline IV 07/01/22 Anterior;Left Upper Arm 07/01/22  1315  Upper Arm  19    Feeding Tube ND 12 Fr. Right nare 07/05/22  1130  Right nare  15                         VITAL SIGNS     Vitals:    07/20/22 2000   BP:    Pulse:    Resp:    Temp: 99.3 F (37.4 C)   SpO2:        Temp  Min: 97.2 F (36.2 C)  Max: 99.3 F (37.4 C)  Pulse  Min: 51  Max: 74  Resp  Min: 17  Max: 42  BP  Min: 104/52  Max: 140/61  SpO2  Min: 95 %  Max: 99 %      Intake/Output Summary (Last 24 hours) at 07/20/2022 2014  Last data filed at 07/20/2022 1900  Gross per 24 hour   Intake  380 ml   Output 2212 ml   Net -1832 ml          4 eyes in 4 hours pressure injury assessment note:      Completed with:   Unit & Time admitted:              Bony Prominences: Check appropriate box; if wound is present enter wound assessment in LDA     Occiput:                 [x] WNL  []  Wound present  Face:                     [x] WNL  []  Wound present  Ears:                      [  x]WNL  []  Wound present  Spine:                    [x] WNL  []  Wound present  Shoulders:             [x] WNL  []  Wound present  Elbows:                  [x] WNL  []  Wound present  Sacrum/coccyx:     [] WNL  [x]  Wound present  Ischial Tuberosity:  [x] WNL  []  Wound present  Trochanter/Hip:      [x] WNL  []  Wound present  Knees:                   [x] WNL  []  Wound present  Ankles:                   [x] WNL  []  Wound present  Heels:                    [x] WNL  []  Wound present  Other pressure areas:  []  Wound location    Abdominal incision       Device related: []  Device name:         LDA completed if wound present: yes/no  Consult WOCN if necessary    Other skin related issues, ie tears, rash, etc, document in Integumentary flowsheet            CARE PLAN                   Problem: Compromised Sensory Perception  Goal: Sensory Perception Interventions  Outcome: Progressing  Flowsheets (Taken 07/20/2022 0800)  Sensory Perception Interventions: Offload heels, Pad bony prominences, Reposition q 2hrs/turn Clock, Q2 hour skin assessment under devices if present     Problem: Compromised Moisture  Goal: Moisture level Interventions  Outcome: Progressing  Flowsheets (Taken 07/20/2022 1132)  Moisture level Interventions: Moisture wicking products, Moisture barrier cream     Problem: Compromised Activity/Mobility  Goal: Activity/Mobility Interventions  Outcome: Progressing  Flowsheets (Taken 07/20/2022 0800)  Activity/Mobility Interventions: Pad bony prominences, TAP Seated positioning system when OOB, Promote PMP, Reposition q 2 hrs / turn clock, Offload heels      Problem: Inadequate Airway Clearance  Goal: Patent Airway maintained  Outcome: Progressing  Flowsheets (Taken 07/20/2022 1132)  Patent airway maintained: Position patient for maximum ventilatory efficiency     Problem: Impaired Mobility  Goal: Mobility/Activity is maintained at optimal level for patient  Outcome: Progressing  Flowsheets (Taken 07/20/2022 0643 by Jillyn Ledger, RN)  Mobility/activity is maintained at optimal level for patient:   Encourage independent activity per ability   Consult/collaborate with Physical Therapy and/or Occupational Therapy     Problem: Safety  Goal: Patient will be free from injury during hospitalization  Outcome: Progressing  Flowsheets (Taken 07/17/2022 2155 by Creig Hines, RN)  Patient will be free from injury during hospitalization:   Assess patient's risk for falls and implement fall prevention plan of care per policy   Ensure appropriate safety devices are available at the bedside   Hourly rounding   Use appropriate transfer methods     Problem: Renal Instability  Goal: Fluid and electrolyte balance are achieved/maintained  Outcome: Progressing  Flowsheets (Taken 07/20/2022 0643 by Jillyn Ledger, RN)  Fluid and electrolyte balance are achieved/maintained:   Observe for cardiac arrhythmias   Assess and reassess fluid and electrolyte status   Monitor for muscle weakness  Follow fluid restrictions/IV/PO parameters   Monitor/assess lab values and report abnormal values     Problem: Patient Receiving Advanced Renal Therapies  Goal: Therapy access site remains intact  Outcome: Progressing  Flowsheets (Taken 07/20/2022 1132)  Therapy access site remains intact: Assess therapy access site     Problem: Inadequate Gas Exchange  Goal: Patent Airway maintained  Outcome: Progressing  Flowsheets (Taken 07/20/2022 1132)  Patent airway maintained: Position patient for maximum ventilatory efficiency  Goal: Adequate oxygenation and improved ventilation  Outcome: Progressing  Flowsheets  (Taken 07/20/2022 1132)  Adequate oxygenation and improved ventilation:   Assess lung sounds   Monitor SpO2 and treat as needed     Problem: Neurological Deficit  Goal: Neurological status is stable or improving  Outcome: Progressing  Flowsheets (Taken 07/20/2022 0643 by Jillyn Ledger, RN)  Neurological status is stable or improving:   Monitor/assess/document neurological assessment (Stroke: every 4 hours)   Re-assess NIH Stroke Scale for any change in status   Observe for seizure activity and initiate seizure precautions if indicated   Perform CAM Assessment   Monitor/assess NIH Stroke Scale     Problem: Every Day - Stroke  Goal: Mobility/Activity is maintained at optimal level for patient  Outcome: Progressing  Flowsheets (Taken 07/20/2022 0643 by Jillyn Ledger, RN)  Mobility/activity is maintained at optimal level for patient:   Encourage independent activity per ability   Consult/collaborate with Physical Therapy and/or Occupational Therapy  Goal: Neurological status is stable or improving  Outcome: Progressing  Flowsheets (Taken 07/20/2022 0643 by Jillyn Ledger, RN)  Neurological status is stable or improving:   Monitor/assess/document neurological assessment (Stroke: every 4 hours)   Re-assess NIH Stroke Scale for any change in status   Observe for seizure activity and initiate seizure precautions if indicated   Perform CAM Assessment   Monitor/assess NIH Stroke Scale  Goal: Stable vital signs and fluid balance  Outcome: Progressing  Flowsheets (Taken 07/19/2022 0051 by Biagio Quint Simdi, RN)  Stable vital signs and fluid balance:   Position patient for maximum circulation/cardiac output   Monitor and assess vitals every 4 hours or as ordered and hemodynamic parameters   Monitor intake and output. Notify LIP if urine output is < 30 mL/hour.  Goal: Skin integrity is maintained or improved  Outcome: Progressing  Flowsheets (Taken 07/19/2022 0051 by Biagio Quint Simdi, RN)  Skin integrity is maintained or  improved:   Assess Braden Scale every shift   Turn or reposition patient every 2 hours or as needed unless able to reposition self   Increase activity as tolerated/progressive mobility   Avoid shearing   Keep skin clean and dry   Relieve pressure to bony prominences  Goal: Neurovascular status is stable or improving  Outcome: Progressing  Flowsheets (Taken 07/19/2022 0051 by Biagio Quint Simdi, RN)  Neurovascular status is stable or improving: Monitor/assess neurovascular status (pulses, capillary refill, pain, paresthesia, presence of edema)

## 2022-07-20 NOTE — Plan of Care (Signed)
Problem: Moderate/High Fall Risk Score >5  Goal: Patient will remain free of falls  Outcome: Progressing  Flowsheets (Taken 07/19/2022 2200)  High (Greater than 13):   HIGH-Visual cue at entrance to patient's room   HIGH-Bed alarm on at all times while patient in bed   HIGH-Apply yellow "Fall Risk" arm band   HIGH-Initiate use of floor mats as appropriate   HIGH-Consider use of low bed     Problem: Compromised Sensory Perception  Goal: Sensory Perception Interventions  Outcome: Progressing  Flowsheets (Taken 07/20/2022 0643)  Sensory Perception Interventions: Offload heels, Pad bony prominences, Reposition q 2hrs/turn Clock, Q2 hour skin assessment under devices if present     Problem: Compromised Moisture  Goal: Moisture level Interventions  Outcome: Progressing  Flowsheets (Taken 07/20/2022 0643)  Moisture level Interventions: Moisture wicking products, Moisture barrier cream     Problem: Compromised Activity/Mobility  Goal: Activity/Mobility Interventions  Outcome: Progressing  Flowsheets (Taken 07/20/2022 0643)  Activity/Mobility Interventions: Pad bony prominences, TAP Seated positioning system when OOB, Promote PMP, Reposition q 2 hrs / turn clock, Offload heels     Problem: Compromised Nutrition  Goal: Nutrition Interventions  Outcome: Progressing  Flowsheets (Taken 07/20/2022 0643)  Nutrition Interventions: Discuss nutrition at Rounds, I&Os, Document % meal eaten, Daily weights     Problem: Compromised Friction/Shear  Goal: Friction and Shear Interventions  Outcome: Progressing  Flowsheets (Taken 07/20/2022 0643)  Friction and Shear Interventions: Pad bony prominences, Off load heels, HOB 30 degrees or less unless contraindicated, Consider: TAP seated positioning, Heel foams     Problem: Impaired Mobility  Goal: Mobility/Activity is maintained at optimal level for patient  Outcome: Progressing  Flowsheets (Taken 07/20/2022 0643)  Mobility/activity is maintained at optimal level for patient:   Encourage independent  activity per ability   Consult/collaborate with Physical Therapy and/or Occupational Therapy     Problem: Renal Instability  Goal: Fluid and electrolyte balance are achieved/maintained  Outcome: Progressing  Flowsheets (Taken 07/20/2022 0643)  Fluid and electrolyte balance are achieved/maintained:   Observe for cardiac arrhythmias   Assess and reassess fluid and electrolyte status   Monitor for muscle weakness   Follow fluid restrictions/IV/PO parameters   Monitor/assess lab values and report abnormal values     Problem: Neurological Deficit  Goal: Neurological status is stable or improving  Outcome: Progressing  Flowsheets (Taken 07/20/2022 0643)  Neurological status is stable or improving:   Monitor/assess/document neurological assessment (Stroke: every 4 hours)   Re-assess NIH Stroke Scale for any change in status   Observe for seizure activity and initiate seizure precautions if indicated   Perform CAM Assessment   Monitor/assess NIH Stroke Scale     Problem: Every Day - Stroke  Goal: Mobility/Activity is maintained at optimal level for patient  Outcome: Progressing  Flowsheets (Taken 07/20/2022 [phone])  Mobility/activity is maintained at optimal level for patient:   Encourage independent activity per ability   Consult/collaborate with Physical Therapy and/or Occupational Therapy  Goal: Neurological status is stable or improving  Outcome: Progressing  Flowsheets (Taken 07/20/2022 0643)  Neurological status is stable or improving:   Monitor/assess/document neurological assessment (Stroke: every 4 hours)   Re-assess NIH Stroke Scale for any change in status   Observe for seizure activity and initiate seizure precautions if indicated   Perform CAM Assessment   Monitor/assess NIH Stroke Scale  Goal: Core/Quality measure requirements - Daily  Outcome: Progressing  Flowsheets (Taken 07/20/2022 0643)  Core/Quality measure requirements - Daily:   VTE Prevention: Ensure anticoagulant(s) administered  and/or anti-embolism  stockings/devices documented by end of day 2   Ensure antithrombotic administered or contraindication documented by LIP by end of day 2   Once lipid panel has resulted, check LDL. Contact provider for statin order if LDL > 70 (or ensure contraindication documented by LIP).   Continue stroke education (must include Modifiable Risk Factors, Warning Signs and Symptoms of Stroke, Activation of Emergency Medical System and Follow-up Appointments). Ensure handout has been given and documented.

## 2022-07-20 NOTE — Progress Notes (Signed)
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 and  cystectomy on 06/28/22  - complicated by small bowel enterotomy and blood loss   New onset afib - on amiodarone  High grade urothelial bladder CA: s/p bilateral nephrectomies and cystectomy c/b mall bowel enterotomy and blood loss (~1L) on 06/28/22  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 - remains low, adequate iron  Encephalopathy - better   Leukocytosis - improving - on meropenem and micafungin  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   Hypophosphatemia - resolved    Recommendations:   S/p HD today with 2L net UF   Next HD on Thursday    Case discussed with: daughter    Delice Bison, MD  [facility]  [phone] (office)  X [phone] (FF[facility] Spectra-Link)      Subjective: transferred to the ICU earlier today  Awake, NGT in place  Tolerated HD well, hemodynamically stable       Review of Systems:   Awake, smiled     Physical Exam:     Vitals:    07/20/22 1245 07/20/22 1300 07/20/22 1315 07/20/22 1320   BP: 110/53 129/62 134/59 134/59   Pulse: 64 70 (!) 56 69   Resp:   21 21   Temp:   98.2 F (36.8 C) 98.2 F (36.8 C)   TempSrc:       SpO2:   99% 99%   Weight:       Height:           Intake and Output Summary (Last 24 hours) at Date Time    Intake/Output Summary (Last 24 hours) at 07/20/2022 1554  Last data filed at 07/20/2022 1320  Gross per 24 hour   Intake 165 ml   Output 2212 ml   Net -2047 ml          General: awake, in no distress, awaiting to be suctioned   Cardiovascular: regular rate and rhythm  Lungs: bilateral air entry  Abdomen: soft  Extremities: LUE edema  Access: R permacath     Meds:      Scheduled Meds: PRN Meds:    acetaminophen, 650 mg, Oral, 4 times per day   Or  acetaminophen, 650 mg, Oral, 4 times per day  amiodarone, 200 mg, per NG tube, Daily  aspirin, 81 mg, per NG tube, Daily  carvedilol, 12.5 mg, Oral, Q12H SCH  darbepoetin alfa, 0.45 mcg/kg, Subcutaneous, Weekly  glycopyrrolate, 0.2 mg, Intravenous, Q8H  heparin (porcine), 5,000 Units, Subcutaneous, Q12H SCH  insulin glargine, 7 Units, Subcutaneous, QAM  insulin lispro, 1-8 Units, Subcutaneous, Q6H  meropenem, 500 mg, Intravenous, Q24H  micafungin, 100 mg, Intravenous, Q24H  pantoprazole, 40 mg, Intravenous, BID  thiamine, 100 mg, Intravenous, Q24H SCH          Continuous Infusions:       acetaminophen, 650 mg, Q6H PRN  albuterol-ipratropium, 3  mL, Q6H PRN  benzocaine, 1 spray, TID PRN  dextrose, 15 g of glucose, PRN   Or  dextrose, 12.5 g, PRN   Or  dextrose, 12.5 g, PRN   Or  glucagon (rDNA), 1 mg, PRN  diphenhydrAMINE, 25 mg, Q6H PRN  hydrALAZINE, 10 mg, Q3H PRN  labetalol, 10 mg, Q15 Min PRN  lidocaine 2% jelly, , Once PRN  naloxone, 0.2 mg, PRN  phenol, 1 spray, Q2H PRN              Labs:     Recent Labs   Lab 07/20/22  0414 07/19/22  0216 07/18/22  0421   WBC 12.59* 13.86* 14.15*   Hemoglobin 7.2* 7.4* 7.8*   Hematocrit 21.6* 22.5* 24.5*   Platelet Count 403* 354* 323     Recent Labs   Lab 07/20/22  0414 07/19/22  0216 07/18/22  0421 07/17/22  0431 07/16/22  0244   Sodium 135 139 139  More results in Results Review 134*   Potassium 4.2 4.5 3.7  More results in Results Review 3.4*   Chloride 100 102 101  More results in Results Review 98*   CO2 22 23 25   More results in Results Review 27   BUN 57* 45* 31*  More results in Results Review 32*   Creatinine 4.8* 3.9* 2.8*  More results in Results Review 2.5*   Calcium 9.0 9.0 8.9   More results in Results Review 8.2   Albumin 1.6*  --  1.8*  --  1.8*   Phosphorus 4.2 3.9 2.4  More results in Results Review 2.1*   Magnesium 2.0 1.9 1.8  More results in Results Review 1.7   Glucose 79 196* 185*  More results in Results Review 166*   GFR 8.4* 10.8* 16.1*  More results in Results Review 18.4*   More results in Results Review = values in this interval not displayed.           Signed by: Delice Bison, MD, FASN

## 2022-07-20 NOTE — Progress Notes (Signed)
Hd tx completed x3.5hrs, 2L fluid removed, VS stable during tx,  pt is awake & responsive when name called, room air sats 99%, Report given to [facility] RN  Patient Vitals for the past 4 hrs:   BP Temp Pulse Resp   07/20/22 1320 134/59 98.2 F (36.8 C) 69 21   07/20/22 1315 134/59 98.2 F (36.8 C) (!) 56 21   07/20/22 1300 129/62 -- 70 --   07/20/22 1245 110/53 -- 64 --   07/20/22 1230 119/58 -- (!) 59 --   07/20/22 1215 137/60 -- 68 --   07/20/22 1200 132/61 -- 66 --   07/20/22 1145 121/58 -- 60 --   07/20/22 1130 123/57 -- 60 --   07/20/22 1115 138/65 -- 67 --   07/20/22 1100 127/62 -- (!) 51 --   07/20/22 1045 120/56 -- (!) 56 --   07/20/22 1030 123/60 -- 60 --   07/20/22 1015 117/58 -- (!) 55 --   07/20/22 1000 117/56 -- 62 --   07/20/22 0945 118/58 -- 67 --   07/20/22 0930 126/60 98.1 F (36.7 C) 68 (!) 29        07/20/22 1320   Treatment Summary   Time Off Machine 1320   Duration of Treatment (Hours) 3.5   Treatment Type 1:1   Dialyzer Clearance Moderately streaked   Fluid Volume Off (mL) 2500   Prime Volume (mL) 200   Rinseback Volume (mL) 200   Fluid Given: Normal Saline (mL) 0   Fluid Given: PRBC  0 mL   Fluid Given: Albumin (mL) 0   Fluid Given: Other (mL) 0   Total Fluid Given 400   Hemodialysis Net Fluid Removed 2100   Post Treatment Assessment   Post-Treatment Weight (Kg) -2.1   Patient Response to Treatment tolerated well   Additional Dialyzer Used 0   Permacath Catheter - Tunneled 07/14/22 Subclavian vein catheter Right   Placement Date/Time: 07/14/22 1404   Inserted by: Dr. Welton Flakes  Access Type: Subclavian vein catheter  Orientation: Right  Central Line Infection Prevention Education provided?: Yes  Hand Hygiene: Alcohol based hand scrub;Soap and water  Line cart u...   Line necessity reviewed? Apheresis/hemodialysis   Site Assessment Clean;Dry;Intact   Catheter Lumen Volume Venous 1.6 mL   Catheter Lumen Volume Arterial 1.6 mL   Dressing Status and Intervention Dressing Intact;Clean & Dry    Tego/Curos Caps on Catheter Yes   NEW Tego/Curos Caps placed (Date) 07/20/22   APHERESIS ONLY:  Access  Red   APHERESIS ONLY: Return Blue   End Caps Free From Blood Yes   Line Care Connections checked and tightened   Dressing Type Transparent;Biopatch   Dressing Status Clean;Dry;Intact   Dressing/Line Intervention Caps changed   Vitals   Temp 98.2 F (36.8 C)   Heart Rate 69   Resp Rate 21   BP 134/59   SpO2 99 %   O2 Device None (Room air)   Assessment   Mental Status Cooperative;Lethargic  (responds to name called)   Cardiac (WDL) X   Cardiac Regularity Irregular   Cardiac Symptoms Return to [facility]   Cardiac Rhythm Normal sinus rhythm   Respiratory  WDL   Respiratory Pattern Return to WDL   R Breath Sounds Diminished   L Breath Sounds Diminished   Cough Spontaneous   Edema  WDL   Generalized Edema Non Pitting Edema   RLE Edema +1   LLE Edema +1   General Skin Color Appropriate for  ethnicity   Skin Condition/Temp Cool   Gastrointestinal (WDL) X   Abdomen Inspection   (wound vac)   Mobility Bed   Pain Assessment   Charting Type Reassessment   Pain Scale Used Numeric Scale (0-10)   Numeric Pain Scale   Pain Score 0   POSS Score 2   Multiple Pain Sites No   Education   Person taught Patient   Knowledge basis Substantial   Topics taught Procedure   Teaching Tools Explain   Reponse Unable to Teach Back (see Comments)  (unable to follow commands)   Bedside Nurse Communication   Name of bedside RN - post dialysis Orbie Pyo RN

## 2022-07-20 NOTE — Progress Notes (Signed)
Infectious Diseases & Tropical Medicine  Progress Note    07/20/2022   Genessis Fielder YQM:[id number],WUX:[id number] is a 82 y.o. female,       Assessment:     Encephalopathy  Acute CVA  History of bladder cancer and renal failure  Status post radical cystectomy/bilateral nephrectomy, enterectomy and lysis of adhesion (06/28/2022)  S/p small bowel resection  CT scan of abdomen and pelvis-no abscess or acute inflammatory process (07/19/2022)  Severe anemia  Leukocytosis improving  End-stage renal disease on hemodialysis  Atrial fibrillation  Risk of aspiration    Plan:     Discontinue meropenem and Mycamine after today's last dose  Continue local wound care  Follow-up chest x-ray  Continue probiotics  Monitor electrolytes and renal functions closely  Monitor clinically  Discussed with nursing staff    ROS:     General:  no fever, awake, weak and lethargic, receiving hemodialysis  Respiratory: No cough or shortness of breath  Gastrointestinal: Abdominal wall wound VAC in place  Genito-Urinary: no hematuria  Musculoskeletal: no edema  Neurological: awake and alert  Dermatological: No rash    Physical Examination:     Blood pressure 140/61, pulse 62, temperature 97.7 F (36.5 C), temperature source Axillary, resp. rate (!) 33, height 1.575 m (5\' 2" ), weight 90.4 kg (199 lb 4.7 oz), SpO2 95%.    General Appearance: Awake, confused  HEENT: Pupils are equal, round, and reactive to light.   Lungs: Decreased breath sounds  Heart: Regular rate and rhythm  Chest: Symmetric chest wall expansion.   Abdomen: soft, no hepatosplenomegaly, no bowel sounds  Neurological: awake  Extremities: No edema    Laboratory And Diagnostic Studies:     Recent Labs     07/20/22  0414 07/19/22  0216   WBC 12.59* 13.86*   Hemoglobin 7.2* 7.4*   Hematocrit 21.6* 22.5*   Platelet Count 403* 354*     Recent Labs     07/20/22  0414 07/19/22  0216   Sodium 135 139   Potassium 4.2 4.5   Chloride 100 102   CO2 22 23   BUN 57* 45*   Creatinine 4.8* 3.9*    Glucose 79 196*   Calcium 9.0 9.0     Recent Labs     07/20/22  0414 07/18/22  0421   AST (SGOT) 10 18   ALT 9 12   Alkaline Phosphatase 97 122*   Protein, Total 4.6* 4.9*   Albumin 1.6* 1.8*       Current Meds:      Scheduled Meds: PRN Meds:    acetaminophen, 650 mg, Oral, 4 times per day   Or  acetaminophen, 650 mg, Oral, 4 times per day  amiodarone, 200 mg, per NG tube, Daily  aspirin, 81 mg, per NG tube, Daily  carvedilol, 12.5 mg, Oral, Q12H SCH  darbepoetin alfa, 0.45 mcg/kg, Subcutaneous, Weekly  glycopyrrolate, 0.2 mg, Intravenous, Q8H  heparin (porcine), 5,000 Units, Subcutaneous, Q12H SCH  insulin glargine, 7 Units, Subcutaneous, QAM  insulin lispro, 1-8 Units, Subcutaneous, Q6H  meropenem, 500 mg, Intravenous, Q24H  micafungin, 100 mg, Intravenous, Q24H  pantoprazole, 40 mg, Intravenous, BID  thiamine, 100 mg, Intravenous, Q24H SCH        Continuous Infusions:     acetaminophen, 650 mg, Q6H PRN  albuterol-ipratropium, 3 mL, Q6H PRN  benzocaine, 1 spray, TID PRN  dextrose, 15 g of glucose, PRN   Or  dextrose, 12.5 g, PRN   Or  dextrose, 12.5 g, PRN  Or  glucagon (rDNA), 1 mg, PRN  diphenhydrAMINE, 25 mg, Q6H PRN  hydrALAZINE, 10 mg, Q3H PRN  labetalol, 10 mg, Q15 Min PRN  lidocaine 2% jelly, , Once PRN  naloxone, 0.2 mg, PRN  phenol, 1 spray, Q2H PRN          Jarae Panas A. Janalyn Rouse, M.D.  07/20/2022  8:21 AM

## 2022-07-20 NOTE — Plan of Care (Signed)
Problem: Renal Instability  Goal: Fluid and electrolyte balance are achieved/maintained  Flowsheets (Taken 07/20/2022 0643 by Jillyn Ledger, RN)  Fluid and electrolyte balance are achieved/maintained:   Observe for cardiac arrhythmias   Assess and reassess fluid and electrolyte status   Monitor for muscle weakness   Follow fluid restrictions/IV/PO parameters   Monitor/assess lab values and report abnormal values     Problem: Patient Receiving Advanced Renal Therapies  Goal: Therapy access site remains intact  Flowsheets (Taken 07/19/2022 2016 by Lyda Jester, RN)  Therapy access site remains intact:   Assess therapy access site   Change therapy access site dressing as needed

## 2022-07-20 NOTE — Progress Notes (Signed)
[facility] Consultation Note                                       Date Time: 07/20/22 10:55 AM  Patient Name: Shelby Barron  Requesting Physician: Lennox Pippins, MD  Date of Admission: 06/28/2022    CC / Reason for Consultation: LUE weakness         Assessment:   82y/o woman hx of COPD, CVA, DM, renal cell carcinoma s/p bilateral nephrectomy, [name], new diagnosis of urothelial cancer admitted for elective cystectomy and completion nephrectomy. [name]. Hospital course complicated by new onset A fib with RVR. Neurology consulted for LUE weakness and change in speech.     MRI brain with patchy areas of acute infarct involving bilateral cerebral hemispheres. Pattern suggestive of watershed infarcts. Infarcts likely in setting of drop in blood pressure based on distribution of infarcts on imaging. Symptoms reportedly first noticed after patient returned from OR.     Now with waxing and waning mental status, noted to be less responsive. Repeat MRI and EEG unrevealing. Decline in mental status likely related to toxic-metabolic encephalopathy vs hospital delirium.     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   -cEEG w/ generalized slowing with intermittent GPD's. No seizures     Plan:   -ASA 81mg  daily cleared by GI. Long term would likely benefit from anticoagulation in setting of new onset A fib but would favor waiting 10 days from onset due to size of infarcts and potential for hemorrhagic conversion  -Continue high intensity statin  -SBP goal 120-160   -PT/OT evaluation    Discussed w/ pt's daughter.     Subjective   Mental status improving slowly     Meds     Home :   Prior to Admission medications    Medication Sig Start Date End Date Taking? Authorizing Provider   allopurinol (ZYLOPRIM) 100 MG tablet Take 1 tablet (100 mg) by mouth daily   Yes  [provider]   atorvastatin (LIPITOR) 40 MG tablet Take 1 tablet (40 mg) by mouth daily   Yes [provider]   conjugated estrogens (PREMARIN) vaginal cream Place vaginally daily   Yes [provider]   insulin aspart (NOVOLOG) 100 UNIT/ML injection Inject 5-10 Units into the skin as needed   Yes [provider]   Insulin Degludec Evaristo Bury) 100 UNIT/ML Solution Inject 36 Units into the skin daily   Yes [provider]   metoprolol tartrate (LOPRESSOR) 25 MG tablet Take 0.5 tablets (12.5 mg) by mouth daily   Yes [provider]   montelukast (SINGULAIR) 10 MG tablet Take 1 tablet (10 mg) by mouth nightly   Yes [provider]   ondansetron (Zofran) 4 MG tablet Take 1 tablet (4 mg) by mouth every 8 (eight) hours as needed for Nausea 06/04/22  Yes Yousuf, Munib A, MD   oxybutynin XL (DITROPAN-XL) 5 MG 24 hr tablet Take 1 tablet (5 mg) by mouth daily   Yes [provider]   oxyCODONE (OXY-IR) 5 MG capsule Take 1 capsule (5 mg) by mouth every 4 (four) hours as needed   Yes [provider]   pantoprazole (PROTONIX) 40 MG tablet Take 1 tablet (40 mg) by  mouth daily   Yes [provider]   pregabalin (LYRICA) 50 MG capsule Take 1 capsule (50 mg) by mouth Per pt has not been taking   Yes [provider]   sevelamer carbonate (RENVELA) 800 MG tablet Take 1 tablet (800 mg) by mouth 2 (two) times daily   Yes [provider]   torsemide (DEMADEX) 100 MG tablet Take 1 tablet (100 mg) by mouth daily   Yes [provider]   umeclidinium-vilanterol (Anoro Ellipta) 62.5-25 MCG/ACT Aerosol Pwdr, Breath Activated Inhale 1 puff into the lungs daily   Yes [provider]   Zolpidem Tartrate (AMBIEN PO) Take 5 mg by mouth as needed   Yes [provider]   b complex vitamins capsule Take 1 capsule by mouth daily    [provider]   cinacalcet (SENSIPAR) 30 MG tablet Take 1 tablet (30 mg) by  mouth daily    [provider]   clopidogrel (PLAVIX) 75 MG tablet Take 1 tablet (75 mg) by mouth daily    [provider]   hyoscyamine (LEVSIN) 0.125 MG tablet Take 1 tablet (0.125 mg) by mouth every 4 (four) hours as needed for Cramping 06/02/22   Yousuf, Munib A, MD      Inpatient :   Current Facility-Administered Medications   Medication Dose Route Frequency    acetaminophen  650 mg Oral 4 times per day    Or    acetaminophen  650 mg Oral 4 times per day    amiodarone  200 mg per NG tube Daily    aspirin  81 mg per NG tube Daily    carvedilol  12.5 mg Oral Q12H SCH    darbepoetin alfa  0.45 mcg/kg Subcutaneous Weekly    glycopyrrolate  0.2 mg Intravenous Q8H    heparin (porcine)  5,000 Units Subcutaneous Q12H SCH    insulin glargine  7 Units Subcutaneous QAM    insulin lispro  1-8 Units Subcutaneous Q6H    meropenem  500 mg Intravenous Q24H    micafungin  100 mg Intravenous Q24H    pantoprazole  40 mg Intravenous BID    thiamine  100 mg Intravenous Q24H SCH         Physical Exam:   Temp:  [97.2 F (36.2 C)-98.5 F (36.9 C)] 98.1 F (36.7 C)  Heart Rate:  [55-81] 56  Resp Rate:  [18-42] 29  BP: (117-181)/(56-102) 120/56       General: Not fully cooperative with the exam  Neck: supple  CVS: warm and well perfused  Resp: no respiratory distress  Extremities: no pedal edema, no rashes noted    Neurological Examination:  MSE: sleepy but wakes to verbal stimuli, oriented to person, place. follows simple commands  CN: Pupils 4-->35mm b/l, R gaze preference, no nystagmus, ?dec blink to threat on L, face symmetric  Motor: No movement of LUE. RUE proximally 0/5, distally 3/5. B/l LE with minimal withdrawal to noxious stimuli  Sensory: grimaces to noxious stimuli throughout  Coordination: does not cooperate w/ FTN    Labs:     Results       Procedure Component Value Units Date/Time    Whole Blood Glucose POCT [[id number]]  (Normal) Collected: 07/20/22 0641    Specimen: Blood, Capillary Updated: 07/20/22  0644     Whole Blood Glucose POCT 90 mg/dL     Magnesium [[id number]]  (Normal) Collected: 07/20/22 0414    Specimen: Blood, Venous Updated: 07/20/22 0456  Magnesium 2.0 mg/dL     Phosphorus [[id number]]  (Normal) Collected: 07/20/22 0414    Specimen: Blood, Venous Updated: 07/20/22 0456     Phosphorus 4.2 mg/dL     Comprehensive Metabolic Panel [[id number]]  (Abnormal) Collected: 07/20/22 0414    Specimen: Blood, Venous Updated: 07/20/22 0456     Glucose 79 mg/dL      BUN 57 mg/dL      Creatinine 4.8 mg/dL      Sodium 782 mEq/L      Potassium 4.2 mEq/L      Chloride 100 mEq/L      CO2 22 mEq/L      Calcium 9.0 mg/dL      Anion Gap 95.6     GFR 8.4 mL/min/1.73 m2      AST (SGOT) 10 U/L      ALT 9 U/L      Alkaline Phosphatase 97 U/L      Albumin 1.6 g/dL      Protein, Total 4.6 g/dL      Globulin 3.0 g/dL      Albumin/Globulin Ratio 0.5     Bilirubin, Total 0.2 mg/dL     CBC with Differential [[id number]]  (Abnormal) Collected: 07/20/22 0414    Specimen: Blood, Venous Updated: 07/20/22 0437    Narrative:      The following orders were created for panel order CBC with Differential.  Procedure                               Abnormality         Status                     ---------                               -----------         ------                     CBC with Differential[[id number]]        Abnormal            Final result                 Please view results for these tests on the individual orders.    CBC with Differential [[id number]]  (Abnormal) Collected: 07/20/22 0414    Specimen: Blood, Venous Updated: 07/20/22 0437     WBC 12.59 x10 3/uL      Hemoglobin 7.2 g/dL      Hematocrit 41.3 %      Platelet Count 403 x10 3/uL      MPV 10.5 fL      RBC 2.39 x10 6/uL      MCV 90.4 fL      MCH 30.1 pg      MCHC 33.3 g/dL      RDW 15 %      nRBC % 0.0 /100 WBC      Absolute nRBC 0.00 x10 3/uL      Preliminary Absolute Neutrophil Count 9.17 x10 3/uL      Neutrophils % 72.8 %      Lymphocytes % 17.2 %      Monocytes % 7.7 %       Eosinophils % 0.4 %      Basophils % 0.3 %  Immature Granulocytes % 1.6 %      Absolute Neutrophils 9.17 x10 3/uL      Absolute Lymphocytes 2.16 x10 3/uL      Absolute Monocytes 0.97 x10 3/uL      Absolute Eosinophils 0.05 x10 3/uL      Absolute Basophils 0.04 x10 3/uL      Absolute Immature Granulocytes 0.20 x10 3/uL     Whole Blood Glucose POCT [[id number]]  (Normal) Collected: 07/20/22 0328    Specimen: Blood, Capillary Updated: 07/20/22 0400     Whole Blood Glucose POCT 83 mg/dL     Whole Blood Glucose POCT [[id number]]  (Normal) Collected: 07/20/22 0030    Specimen: Blood, Capillary Updated: 07/20/22 0039     Whole Blood Glucose POCT 88 mg/dL     Culture, Blood, Aerobic And Anaerobic [[id number]] Collected: 07/18/22 1400    Specimen: Blood, Venous Updated: 07/19/22 2200     Culture Blood No growth at 1 day    Culture, Blood, Aerobic And Anaerobic [[id number]] Collected: 07/18/22 1400    Specimen: Blood, Venous Updated: 07/19/22 2200     Culture Blood No growth at 1 day    Whole Blood Glucose POCT [[id number]]  (Abnormal) Collected: 07/19/22 1938    Specimen: Blood, Capillary Updated: 07/19/22 1942     Whole Blood Glucose POCT 146 mg/dL     Whole Blood Glucose POCT [[id number]]  (Abnormal) Collected: 07/19/22 1414    Specimen: Blood, Capillary Updated: 07/19/22 1417     Whole Blood Glucose POCT 135 mg/dL             Rads:     Results for orders placed or performed during the hospital encounter of 06/28/22   CT Head WO Contrast    Narrative    HISTORY: Altered mental status.     COMPARISON: Brain MRI 09/20/2008.    TECHNIQUE: CT of the head performed without intravenous contrast. The  following dose reduction techniques were utilized: automated exposure  control and/or adjustment of the mA and/or KV according to patient size,  and the use of an iterative reconstruction technique.    CONTRAST: None.    FINDINGS:  The ventricles and sulcal pattern are within normal limits for the  patient's stated age. There is  mild-moderate supratentorial chronic  ischemic disease. No acute infarct is noted. No intracranial hemorrhage is  seen. There is minimal paranasal sinus disease. There is moderate right  mastoid opacification. 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      Impression       1.No acute intracranial abnormality is seen.    Leandro Reasoner, MD  07/06/2022 3:56 AM         Arvis Miguez Gypsy Balsam, MD  [facility]  Available on Epic chat 7a to 7p

## 2022-07-21 LAB — MAGNESIUM: Magnesium: 1.8 mg/dL (ref 1.6–2.6)

## 2022-07-21 LAB — CBC
Absolute nRBC: 0 10*3/uL (ref <=0.00)
Hematocrit: 23.9 % — ABNORMAL LOW (ref 34.7–43.7)
Hemoglobin: 7.8 g/dL — ABNORMAL LOW (ref 11.4–14.8)
MCH: 29.9 pg (ref 25.1–33.5)
MCHC: 32.6 g/dL (ref 31.5–35.8)
MCV: 91.6 fL (ref 78.0–96.0)
MPV: 10.2 fL (ref 8.9–12.5)
Platelet Count: 405 10*3/uL — ABNORMAL HIGH (ref 142–346)
RBC: 2.61 10*6/uL — ABNORMAL LOW (ref 3.90–5.10)
RDW: 15 % (ref 11–15)
WBC: 11.81 10*3/uL — ABNORMAL HIGH (ref 3.10–9.50)
nRBC %: 0 /100 WBC (ref <=0.0)

## 2022-07-21 LAB — BASIC METABOLIC PANEL
Anion Gap: 12 (ref 5.0–15.0)
BUN: 29 mg/dL — ABNORMAL HIGH (ref 7–21)
CO2: 24 mEq/L (ref 17–29)
Calcium: 8.4 mg/dL (ref 7.9–10.2)
Chloride: 101 mEq/L (ref 99–111)
Creatinine: 2.9 mg/dL — ABNORMAL HIGH (ref 0.4–1.0)
GFR: 15.4 mL/min/{1.73_m2} — ABNORMAL LOW (ref >=60.0)
Glucose: 135 mg/dL — ABNORMAL HIGH (ref 70–100)
Potassium: 3.4 mEq/L — ABNORMAL LOW (ref 3.5–5.3)
Sodium: 137 mEq/L (ref 135–145)

## 2022-07-21 LAB — PHOSPHORUS: Phosphorus: 2.9 mg/dL (ref 2.3–4.7)

## 2022-07-21 LAB — WHOLE BLOOD GLUCOSE POCT
Whole Blood Glucose POCT: 124 mg/dL — ABNORMAL HIGH (ref 70–100)
Whole Blood Glucose POCT: 139 mg/dL — ABNORMAL HIGH (ref 70–100)
Whole Blood Glucose POCT: 145 mg/dL — ABNORMAL HIGH (ref 70–100)
Whole Blood Glucose POCT: 152 mg/dL — ABNORMAL HIGH (ref 70–100)
Whole Blood Glucose POCT: 156 mg/dL — ABNORMAL HIGH (ref 70–100)

## 2022-07-21 MED ORDER — POTASSIUM CHLORIDE 20 MEQ PO PACK
40.0000 meq | PACK | Freq: Once | ORAL | Status: AC
Start: 2022-07-21 — End: 2022-07-21
  Administered 2022-07-21: 40 meq via ORAL
  Filled 2022-07-21: qty 2

## 2022-07-21 NOTE — Progress Notes (Signed)
(  Level 2)    Neuro: A/Ox3 (disoriented to situation and month/year sometimes), responds to voice, pupils equal round and sluggish, 3/5 upper extremity and 1/5 rest of extremities (flickers to painful stimulus)    CV: Blood pressure PRN labetolol x2 today for BP above 160, NSR, generalized edema, pulses palpable, afebrile    Respiratory: RA, sounds diminished, nonproductive cough    GI: moderately thick, full liquid; tube feeds Nepro ND route 66ml/hr with 100 ml flushes Q4, 1 loose BM     GU: aneuric, dialysis patient (treatments on tues, thurs, and Saturday)    Skin: surgical incision w/ wound vac on abdomen, tear on L buttock and L thigh

## 2022-07-21 NOTE — OT Progress Note (Signed)
Occupational Therapy Note  Occupational Therapy Treatment  [name]        Post Acute Care Therapy Recommendations   Discharge Recommendations:  SNF    If recommended discharge disposition is not available, patient will require the following assistance: HH aide, Total assist, 24 hour supervision, Assist with OOB activity, Assist with I/ADLs, and HH therapy    DME needs IF patient is discharging home: Hospital bed, [facility] Lift    Therapy discharge recommendations may change with patient status.  Please refer to most recent note for up-to-date recommendations.    Unit: [facility]  Bed: A2702/A2702-01    ___________________________________________________    Time of treatment:  OT Received On: 07/21/22  Start Time: 1309  Stop Time: 1346  Time Calculation (min): 37 min       Chart Review and Collaboration with Care Team: 4 minutes, not included in above time.    OT Visit Number: 3      Precautions and Contraindications:    Precautions  Weight Bearing Status: no restrictions    Personal Protective Equipment (PPE)  gloves and procedure mask    Updated Labs:  Lab Results   Component Value Date/Time    HGB 7.8 (L) 07/21/2022 02:20 AM    HGB 8.6 (L) 06/03/2022 04:23 AM    HCT 23.9 (L) 07/21/2022 02:20 AM    HCT 26.8 (L) 06/03/2022 04:23 AM    K 3.4 (L) 07/21/2022 02:20 AM    K 3.6 06/03/2022 04:23 AM    NA 137 07/21/2022 02:20 AM    NA 137 06/03/2022 04:23 AM    INR 1.1 07/14/2022 04:35 AM    INR 1.1 06/01/2022 03:15 AM    TROPI 9.6 05/26/2022 06:37 PM    TROPI -2.2 05/26/2022 06:37 PM    TROPI 11.8 05/26/2022 03:26 PM       All imaging reviewed, please see chart for details.    Subjective:    .  "Okay, okay"       Patient's medical condition is appropriate for Occupational Therapy intervention at this time.  Family and/or guardian are agreeable to patient's participation in the therapy session. Nursing clears patient for therapy.  Pain Assessment  Pain Assessment:  (pt  reports pain at site of wound vac but unable to rate pain on scale)      Objective:  Observation of Patient/Vital Signs:    Stable    Cognition/Neuro Status  Arousal/Alertness: Delayed responses to stimuli  Attention Span: Difficulty attending to directions  Orientation Level: Oriented to person;Oriented to place;Disoriented to time;Disoriented to situation  Memory: Decreased recall of recent events  Following Commands: Follows one step commands with repetition;Follows one step commands with increased time (less than 25%)  Safety Awareness: maximal verbal instruction  Insights: Educated in safety awareness  Problem Solving: dependent  Behavior: flat affect (lethargic)  Motor Planning: decreased initiation;decreased processing speed  Coordination: FMC impaired;GMC impaired    Functional Mobility  Supine to Sit Transfers: Dependent;Maximal Assist (of 2)  Sit to Supine Transfers: Dependent  Sit to Stand Transfers: not tested    Self-care and Home Management  Eating: NPO (has NG tube)  Grooming: Dependent  Bathing: Dependent  UB Dressing: Dependent  LB Dressing: Moderate Assist  Toileting: Moderate Assist  Functional Transfers:  (UTA due to poor sitting balance, dec alertness, dec B LE strength)    Therapeutic Exercises  Shoulder PROM: Flexion     Pt tolerated EOB sitting X 3-4  mins with max assist to min assist X 5-10 second intervals- tends to lose balance to the left side    Education done with daughter at bedside regarding cognition, exercises, sitting on left side as pt seems to be ignoring that side                          Educated the Family to role of occupational therapy, plan of care, goals of therapy and HEP, safety with mobility and ADLs, home safety, cognition with verbalized understanding .     Patient left in bed with alarm and all other medical equipment in place and call bell and all personal items/needs within reach.  RN notified of session outcome.  CM team and attending have been previously notified  of d/c recommendation; no updates to d/c recommendation since that time.      Assessment:  Pt has deficits in all areas of ADL's and functional mobility along with decreased alertness and dec cognition- weak in all 4 extremities and will need SNF for rehab due to significant deconditioning related to he medical issues.        PMP Activity: Step 2 - Supine Exercises  Distance Walked (ft) (Step 6,7): 0 Feet      Plan:  OT Frequency Recommended: 2-3x/wk  Goal Formulation: Patient      Time For Goal Achievement: by time of discharge  ADL Goals  Patient will groom self: Moderate Assist, Not met     Neuro Re-Ed Goals  Pt will sit at edge of bed: Maximal Assist, for 3 minutes, Partly met  Other Goal: Pt will follow one step v commands 50% of the time (not met)  Musculoskeletal Goals  Pt will perform Home Exercise Program: maximal assist, with caregiver/family assist, Goal met  Executive Fucntion Goals  Pt will demonstrate attention to task: with minimal assist, to increase ability to complete ADLs, Not met  Other Goal: Pt will be oriented X 3 75% of the time              Continue plan of care.      Vernona Rieger Camilo Mander OTR/L   07/21/2022 1:58 PM    [facility]  Patient: Shelby Barron MRN#: [id number]   Unit: [facility] Bed: [id number]/V7846-96

## 2022-07-21 NOTE — Progress Notes (Signed)
[facility]  PROGRESS NOTE      Patient: Shelby Barron  Date:07/08/2022   LOS: 23 Days  Admission Date: 06/28/2022   MRN: [id number]  Attending: Lennox Pippins, MD  When on service as the attending, please contact me on Epic Secure Chat from 7 AM - 7 PM for non-urgent issues. For urgent matters use XTend page from 7 AM - 7 PM.       ASSESSMENT/PLAN     Shelby Barron is a 82 y.o. female admitted with Bladder cancer    Interval Summary: [name], [name], type 2 diabetes, renal cell carcinoma status post partial bilateral nephrectomy, [name], new diagnosis of urothelial cancer initally presented on 6/10 for elective cystectomy and completion nephrectomy, [name], [name], extubated and off pressors since 6/13.     She is hypoactive and delirious. Surgery recommended NGT and start TF.  Has [name] T/Th/Sat. Nephrology following     Developed new onset afib RVR during HD. Stopped norvasc switched to metoprolol and gave additional IV metoprolol. Consulted cardiology. Discussed with ICU requested a consult given deterioration. Recommended repeat blood cultures and broadening antibiotics to meropenem. Palliative care consulted.    No further episodes of Afib RVR now on amiodarone gtt and PO metoprolol. Hgb stable. Repeating CXR, clear. Started robinul for upper airway secretions/rattling. Clinically improving.    Anticipated Discharge: Greater Than 48 Hours    Barriers to Discharge: Clinical improvement    Family/Social/Case Mgr Assistance: Patient might end up going to rehab  Patient Active Hospital Problem List:  #Toxic metabolic Encephalopathy/delirium  Patient was upgraded to ICU on 6/30 for lethargy and decreased responsiveness  CT head was repeated on 6/29, no acute finding  ABG consistent with respiratory alkalosis  EEG consistent with encephalopathy no seizure, MRI no acute  stroke  Culture pending  CT abdomen with and without contrast after premedication did not show any leak or abscess  Continue supportive care  - Failed SLP eval . Wcont tube feeds per nutrition recs. SLP advanced from NPO to clear liquid mildly thick on 7/2  - Palliative consulted:  Family is clear they would like no limits to care and patient should remain full code.    #s/p open cystectomy, bilateral nephrectomies, and resection of small bowel after intra-op enterotomy    #Mixed shock - hemorrhagic vs septic: Resolved  #Leukocytosis  - 6/10 small bowel enteretomy with 1L EBL. Went to the OR on June 12 for small bowel anastomosis, closure of mesenteric defect, closure of abdominal wall with wound VAC application. Wound VAC changed 6/15  - Surgery consulted, recommend CTAP with PO contrast that showed no anastomotic leak. Low concern for abscesses as they'd expect these to have a more loculated appearance in the interim between 6/17 and 7/1 imaging  - ID consulted, completed meropenem and Mycamine for 2 weeks on 7/3    #Acute CVA on the right frontal lobe  #History of CVA  - Hold home plavix  -Left upper extremity not moving at all unclear duration  -CT head showed right frontal infarction  - Neurology consulted, started ASA 81mg  daily cleared by GI. Long term would likely benefit from anticoagulation in setting of new onset A fib but would favor waiting 10 days from onset due to size of infarcts and potential for hemorrhagic conversion which is 07/23/2022  -  SBP goal 120-160  -Carotid Doppler showed plaque with no critical stenosis  -Repeat head CT done on 6/29 for lethargy showed no acute finding  -EEG encephalopathic pattern no seizure, repeat MRI no new infarction  -Continue reorientation, PT OT recommend SNF    #Acute hypoxemic respiratory failure resolved  #COPD  Extubated on 6/13, currently on room air  Maintain SaO2 > 92%  Continue as needed neb treatment  X-ray repeated on 6/29 showed resolution of  interstitial edema    #ESRD  - s/p CRRT this admission, now on HD TTS  Continue dialysis per nephrology  Nephrology following along    #Nonfunctioning right permacath  IR removed the nonfunctional permacath and right [place] IJ cath and replaced with a new permacath on 6/26.    #HTN  On metoprolol   PRN IV labetalol for SBP > 180     New onset Afib with RVR  - cardiology following  -CHA2DS2-VASc score is 7, 11% yearly risk of stroke  - stable now on amiodarone gtt and metoprolol, cardiology recommended to switch amiodarone to 200 mg daily on 07/16/2022  -GI and general surgery cleared for anticoagulation  -Neurology recommended to hold anticoagulation for 10 days [till 07/23/2022] given the size of stroke      #Renal cell carcinoma  #Urothelial carcinoma  #Thrombocytopenia  - VTE ppx: SCDs, subcutaneous heparin     #Diabetes Mellitus type 2  -Sliding scale insulin          Nutrition: Tube feeds  Malnutrition Documentation    Moderate Malnutrition related to inadequate nutritional intake in the setting of acute illness as evidenced by <50% EER > 5 days, mild muscle losses (temporalis, deltoid) - new                 Patient has BMI=Body mass index is 36.45 kg/m.  Diagnosis: Obesity based on BMI criteria                                                      Recent Labs   Lab 07/21/22  0220 07/20/22  0414 07/19/22  0216   Hemoglobin 7.8* 7.2* 7.4*   Hematocrit 23.9* 21.6* 22.5*   MCV 91.6 90.4 91.1   WBC 11.81* 12.59* 13.86*   Platelet Count 405* 403* 354*     Recent Labs   Lab 07/18/22  0421 06/02/22  0514   Ferritin 2,300.21*  --    Iron 18* 62   TIBC 99* 145*   Iron Saturation 18 43       Code Status: Full Code    Dispo: pending improved mental status    Family Contact: Updated daughter    DVT Prophylaxis:   Current Facility-Administered Medications (Includes Only Anticoagulants, Misc. Hematological)   Medication Dose Route Last Admin    heparin (porcine) injection 5,000 Units  5,000 Units Subcutaneous 5,000 Units at  07/21/22 0859          CHART  REVIEW & DISCUSSION     The following chart items were reviewed as of 9:15 AM on 07/21/22:  [x]  Lab Results [x]  Imaging Results   [x]  Problem List  [x]  Current Orders [x]  Current Medications  [x]  Allergies  []  Code Status []  Previous Notes   []  SDoH    The management and plan of care for this patient was  discussed with the following specialty consultants:  [x]  Cardiology  [x]  Gastroenterology                 []  Infectious Disease  []  Pulmonology [x]  Neurology                [x]  Nephrology  []  Neurosurgery []  Orthopedic Surgery  [x]  ICU []  General Surgery []  Psychiatry                                   [x]  Palliative    SUBJECTIVE     [name]     Aox1, sleepy   Daughter updated bedside    MEDICATIONS     Current Facility-Administered Medications   Medication Dose Route Frequency    acetaminophen  650 mg Oral 4 times per day    Or    acetaminophen  650 mg Oral 4 times per day    amiodarone  200 mg per NG tube Daily    aspirin  81 mg per NG tube Daily    carvedilol  12.5 mg Oral Q12H SCH    darbepoetin alfa  0.45 mcg/kg Subcutaneous Weekly    glycopyrrolate  0.2 mg Intravenous Q8H    heparin (porcine)  5,000 Units Subcutaneous Q12H SCH    insulin glargine  7 Units Subcutaneous QAM    insulin lispro  1-8 Units Subcutaneous Q6H    pantoprazole  40 mg Intravenous BID    thiamine  100 mg Intravenous Q24H SCH       PHYSICAL EXAM     Vitals:    07/21/22 0859   BP: 150/67   Pulse: 80   Resp:    Temp:    SpO2:        Temperature: Temp  Min: 97.8 F (36.6 C)  Max: 99.3 F (37.4 C)  Pulse: Pulse  Min: 51  Max: 83  Respiratory: Resp  Min: 17  Max: 36  Non-Invasive BP: BP  Min: 91/48  Max: 150/67  Pulse Oximetry SpO2  Min: 93 %  Max: 99 %    Intake and Output Summary (Last 24 hours) at Date Time    Intake/Output Summary (Last 24 hours) at 07/21/2022 0915  Last data filed at 07/21/2022 0600  Gross per 24 hour   Intake 1075 ml   Output 2100 ml   Net -1025 ml           GEN APPEARANCE: Normal; more  alert, oriented x 1  HEENT: PERLA; EOMI; Conjunctiva Clear  NECK: Supple; No bruits  CVS: RRR, S1, S2; No M/G/R  LUNGS: CTAB; wheezing improved, no Rhonchi: No rales  ABD: Soft; No TTP; + Normoactive BS  EXT: No edema; Pulses 2+ and intact  NEURO: moving right upper extremity spontaneously, able to move lower extremities for pain but minimal, no movement at all on the left upper extremity  MENTAL STATUS: Lethargic, oriented x 1, able to follow simple commands    LABS     Recent Labs   Lab 07/21/22  0220 07/20/22  0414 07/19/22  0216   WBC 11.81* 12.59* 13.86*   RBC 2.61* 2.39* 2.47*   Hemoglobin 7.8* 7.2* 7.4*   Hematocrit 23.9* 21.6* 22.5*   MCV 91.6 90.4 91.1   Platelet Count 405* 403* 354*       Recent Labs   Lab 07/21/22  0220 07/20/22  0414 07/19/22  0216 07/18/22  0421 07/17/22  0431   Sodium 137 135 139 139 135   Potassium 3.4* 4.2 4.5 3.7 3.8   Chloride 101 100 102 101 98*   CO2 24 22 23 25 23    BUN 29* 57* 45* 31* 51*   Creatinine 2.9* 4.8* 3.9* 2.8* 3.7*   Glucose 135* 79 196* 185* 164*   Calcium 8.4 9.0 9.0 8.9 8.6   Magnesium 1.8 2.0 1.9 1.8 1.7       Recent Labs   Lab 07/20/22  0414 07/18/22  0421 07/16/22  0244   ALT 9 12 11    AST (SGOT) 10 18 15    Bilirubin, Total 0.2 0.2 0.2   Albumin 1.6* 1.8* 1.8*   Alkaline Phosphatase 97 122* 129*                       Microbiology Results (last 15 days)       Procedure Component Value Units Date/Time    Culture, Blood, Aerobic And Anaerobic [[id number]] Collected: 07/18/22 1400    Order Status: Completed Specimen: Blood, Venous Updated: 07/20/22 2200     Culture Blood No growth at 2 days    Culture, Blood, Aerobic And Anaerobic [[id number]] Collected: 07/18/22 1400    Order Status: Completed Specimen: Blood, Venous Updated: 07/20/22 2200     Culture Blood No growth at 2 days    Culture, Blood, Aerobic And Anaerobic [[id number]] Collected: 07/07/22 0355    Order Status: Completed Specimen: Blood, Venous Updated: 07/12/22 0700     Culture Blood No growth at 5 days     Culture, Blood, Aerobic And Anaerobic [[id number]] Collected: 07/06/22 1837    Order Status: Completed Specimen: Blood, Venous Updated: 07/12/22 0000     Culture Blood No growth at 5 days    Culture, Sputum and Lower Respiratory [[id number]]     Order Status: Sent Specimen: Sputum, Induced              RADIOLOGY     CT Abdomen Pelvis W IV And PO Cont    Result Date: 07/19/2022   1.Slight interval increase in left-sided retroperitoneal fluid collection with the other abdominal fluid collections appearing relatively stable. 2. There is no definite abscess or acute inflammatory process. 3. Incidental note is made of a small paraesophageal type hernia. 4. Sigmoid diverticulosis is noted without CT evidence of diverticulitis. 5. Other findings as noted above. Miguel Dibble, MD 07/19/2022 6:36 AM    MRI Brain WO Contrast    Result Date: 07/18/2022   Interval evolution of multifocal recent infarcts in the bilateral cerebral hemispheres. No new infarct. Milan Waunita Schooner, MD 07/18/2022 5:57 PM    XR Chest AP Portable    Result Date: 07/17/2022  1.Near-complete resolution of interstitial edema. 2.Improving pulmonary vascular congestion. Legrand Pitts, MD 07/17/2022 2:38 PM    CT Head WO Contrast    Result Date: 07/17/2022   1.No new acute intracranial pathology. 2.There are multiple regions of cytotoxic edema in the right > left frontal and left > right occipital lobes, consistent with known early subacute infarcts. Dannielle Burn 07/17/2022 8:26 AM    US Carotid Duplex Dopp Comp Bilateral    Result Date: 07/15/2022   1. Plaque formation in the right internal carotid artery bulb with elevated velocities. Stenosis is estimated at 50-69%. 2. Plaque in the left internal carotid artery with normal velocities. Any stenosis is less than 50%. Criteria for stenosis and velocity parameters are based on the guidelines  of the SRU (Society of Radiologists in Ultrasound). Grant et al: Carotid Artery Stenosis: Grayscale and Doppler Ultrasound  Diagnosis-- Society of Radiologists in Ultrasound Consensus Conference Ultrasound Quarterly. Volume 19, Number 4; December, 2003. Recommendations updated October 2021 Verne Carrow is an [facility] accredited facility Suszanne Finch, MD 07/15/2022 9:41 AM    Tunneled Cath Removal (Permcath)    Result Date: 07/14/2022  1. Nonfunctioning tunneled dialysis catheter as described above. 2. Successful placement of a new 19 cm Equistream catheter as described above. Dimitrios Papadouris, MD 07/14/2022 4:06 PM    Tunneled Cath Placement (Permcath)    Result Date: 07/14/2022  1. Nonfunctioning tunneled dialysis catheter as described above. 2. Successful placement of a new 19 cm Equistream catheter as described above. Dimitrios Papadouris, MD 07/14/2022 4:06 PM    MRI Brain WO Contrast    Result Date: 07/13/2022   1.Patchy areas of acute/recent infarction involving the cerebral hemispheres bilaterally. The infarcts have a configuration suggesting watershed territory infarcts. 2.Chronic ischemic changes as discussed. Georgann Housekeeper, MD 07/13/2022 10:28 PM    CT Head WO Contrast    Result Date: 07/13/2022   1.New region of loss of gray-white matter differentiation in the right frontal lobe, concerning for acute infarction. 2.A few additional areas of poor gray-white matter differentiation for example in the left occipital lobe may be artifactual but could be correlated with MRI. 3.No evidence of acute intracranial hemorrhage or significant mass effect. These urgent results were discussed with and acknowledged by Dr. Don Broach on 07/13/2022 3:20 PM. Vara Guardian, MD 07/13/2022 3:21 PM    XR Chest AP Portable    Result Date: 07/10/2022  Interstitial markings have slightly increased, suggesting minimal edema. No other change. Wilmon Pali, MD 07/10/2022 4:49 PM    XR Chest AP Portable    Result Date: 07/07/2022   There is mild interstitial prominence suggesting mild congestive heart failure/fluid overload in the setting of shortness of breath [name], MD  07/07/2022 12:32 PM    XR Chest AP Portable    Result Date: 07/06/2022   Hypoventilation with basilar atelectasis. Charlott Rakes, MD 07/06/2022 3:35 PM    CT Abdomen Pelvis W IV And PO Cont    Result Date: 07/06/2022  1.Postoperative changes from bilateral nephrectomy and cystectomy. 2.Small amount of ascites including some areas which appear loculated. Small collections of fluid and gas in the anterior midline abdomen may be postoperative but superimposed infection is not excluded. No rim-enhancing fluid collections or extravasation of oral contrast to suggest anastomotic leak. Demetrios Isaacs, MD 07/06/2022 5:51 AM    CT Head WO Contrast    Result Date: 07/06/2022   1.No acute intracranial abnormality is seen. Leandro Reasoner, MD 07/06/2022 3:56 AM    XR Abdomen AP    Result Date: 07/05/2022  Enteric tube terminates in the distal stomach. Elease Etienne, DO 07/05/2022 12:24 PM    XR Chest AP Portable    Result Date: 06/30/2022  1. Lines and tubes in adequate position. No pneumothorax. 2. Trace left pleural effusion and bibasilar atelectasis. Jasmine December D'Heureux, MD 06/30/2022 4:53 PM    XR Chest AP Portable    Result Date: 06/30/2022  Catheters as described. Pankaj Dominica, MD 06/30/2022 2:07 PM    XR Chest AP Portable    Result Date: 06/28/2022   Right mainstem bronchial intubation. The tube should be withdrawn about 4 cm. Dr. Tildon Husky is aware. Wynema Birch, MD 06/28/2022 4:56 PM    XR OR MISCOUNT    Result Date: 06/28/2022  No unexpected foreign body. These critical results were discussed with and acknowledged by Angie R., the circulating nurse, who will communicate the findings to Everardo All, MD on 06/28/2022 3:02 PM. Bosie Helper, MD 06/28/2022 3:03 PM   Echo Results       None          No results found for this or any previous visit.    Signed,  Lennox Pippins, MD  9:15 AM 07/21/2022

## 2022-07-21 NOTE — Progress Notes (Cosign Needed)
4 eyes in 4 hours pressure injury assessment note:      Completed with:   Unit & Time admitted:              Bony Prominences: Check appropriate box; if wound is present enter wound assessment in LDA     Occiput:                 [x] WNL  []  Wound present  Face:                     [x] WNL  []  Wound present  Ears:                      [x] WNL  []  Wound present  Spine:                    [x] WNL  []  Wound present  Shoulders:             [x] WNL  []  Wound present  Elbows:                  [x] WNL  []  Wound present  Sacrum/coccyx:     [] WNL  [x]  Wound present (sacral redness)  Ischial Tuberosity:  [x] WNL  []  Wound present  Trochanter/Hip:      [x] WNL  []  Wound present  Knees:                   [x] WNL  []  Wound present  Ankles:                   [x] WNL  []  Wound present  Heels:                    [x] WNL  []  Wound present  Other pressure areas:  [x]  Wound location (tear on left thigh and left buttock)       Device related: []  Device name:         LDA completed if wound present: yes  Consult WOCN if necessary    Other skin related issues, ie tears, rash, etc, document in Integumentary flowsheet

## 2022-07-21 NOTE — Progress Notes (Signed)
[facility] PROGRESS NOTE      Date of Note: 07/21/2022   Patient Name: Shelby Barron     Date of Birth:  03-15-1940     MRN:               [id number]     PCP:               Patsy Lager, MD     I am available on epic chart and Spectra link extension [phone] Monday - Friday 8:30-16:30. If urology consultation is needed outside these hours please contact [facility] at [phone].      ASSESSMENT/  PLAN     -medical management per cardiology, GI, ID, nephrology, medicine, neurology. No additional urology recs.     I will discuss/discussed the plan of care with the following services:  Dr. Graciela Husbands, Urology   Daughter at bedside     I spent a total of 20 minutes involved in this patient's care    Sheldon Silvan, PA  07/21/2022, 1:02 PM    SUBJECTIVE     Interval History: 6/10 radical cystectomy and bilateral nephrectomy with enterotomy 6/12 abdominal wound closure, bowel anastamosis. 6/18 CT no indicative of abscess or leak.     MRI brain with patchy areas of acute infarct involving bilateral cerebral hemispheres. Pattern suggestive of watershed infarcts.     Patient Active Problem List    Diagnosis Date Noted    Anemia 06/30/2022    COPD (chronic obstructive pulmonary disease) 06/30/2022    Bladder cancer 06/28/2022    Malignant neoplasm of urinary bladder, unspecified site 06/25/2022    Malignant tumor of kidney, left 06/25/2022    GERD (gastroesophageal reflux disease) 06/25/2022    Leukocytosis 06/25/2022    Chronic diastolic congestive heart failure 06/23/2022    Aortic atherosclerosis 06/23/2022    Peritonitis 05/26/2022    ESRD (end stage renal disease) 05/26/2022    Hamstring injury 05/10/2013    Benign essential hypertension 02/10/2013    Left renal mass 11/28/2012    Type 2 diabetes mellitus with kidney complication, with long-term current use of insulin 07/31/2010    Diabetes mellitus type II, uncontrolled 07/31/2010    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   Respiratory: Normal rate. No retractions or increased work of breathing.   Abdomen: non-distended, incisions CDI, wound vac in situ    LABS & IMAGING     CBC  Recent Labs     07/21/22  0220 07/20/22  0414 07/19/22  0216   WBC 11.81* 12.59* 13.86*   RBC 2.61* 2.39* 2.47*   Hematocrit 23.9* 21.6* 22.5*   MCV 91.6 90.4 91.1   MCH 29.9 30.1 30.0   MCHC 32.6 33.3 32.9   RDW 15 15 15  MPV 10.2 10.5 10.7       BMP  Recent Labs     07/21/22  0220 07/20/22  0414 07/19/22  0216   CO2 24 22 23    Anion Gap 12.0 13.0 14.0   BUN 29* 57* 45*   Creatinine 2.9* 4.8* 3.9*       INR  Lab Results   Component Value Date/Time    INR 1.1 07/14/2022 04:35 AM    INR 1.1 06/01/2022 03:15 AM         IMAGING:  CT Abdomen Pelvis W IV And PO Cont   Final Result          1.Slight interval increase in left-sided retroperitoneal fluid collection   with the other abdominal fluid collections appearing relatively stable.    2. There is no definite abscess or acute inflammatory process.   3. Incidental note is made of a small paraesophageal type hernia.   4. Sigmoid diverticulosis is noted without CT evidence of diverticulitis.    5. Other findings as noted above.            Miguel Dibble, MD   07/19/2022 6:36 AM      MRI Brain WO Contrast   Final Result          Interval evolution of multifocal recent infarcts in the bilateral cerebral   hemispheres. No new infarct.      Milan Waunita Schooner, MD   07/18/2022 5:57 PM      XR Chest AP Portable   Final Result      1.Near-complete resolution of interstitial edema.   2.Improving  pulmonary vascular congestion.      Legrand Pitts, MD   07/17/2022 2:38 PM      CT Head WO Contrast   Final Result       1.No new acute intracranial pathology.   2.There are multiple regions of cytotoxic edema in the right > left frontal   and left > right occipital lobes, consistent with known early subacute   infarcts.      Dannielle Burn   07/17/2022 8:26 AM      US Carotid Duplex Dopp Comp Bilateral   Final Result       1. Plaque formation in the right internal carotid artery bulb with elevated   velocities. Stenosis is estimated at 50-69%.   2. Plaque in the left internal carotid artery with normal velocities. Any   stenosis is less than 50%.      Criteria for stenosis and velocity parameters are based on the guidelines   of the SRU (Society of Radiologists in Ultrasound).   Grant et al: Carotid Artery Stenosis: Grayscale and Doppler Ultrasound   Diagnosis-- Society of Radiologists in Ultrasound Consensus Conference   Ultrasound Quarterly. Volume 19, Number 4; December, 2003.   Recommendations updated October 2021      [facility] is an [facility] accredited facility      Suszanne Finch, MD   07/15/2022 9:41 AM      Tunneled Cath Removal (Permcath)   Final Result      1. Nonfunctioning tunneled dialysis catheter as described above.   2. Successful placement of a new 19 cm Equistream catheter as described   above.      Dimitrios Papadouris, MD   07/14/2022 4:06 PM      Tunneled Cath Placement (Permcath)   Final Result      1. Nonfunctioning tunneled dialysis catheter as described above.   2. Successful placement  of a new 19 cm Equistream catheter as described   above.      Dimitrios Papadouris, MD   07/14/2022 4:06 PM      MRI Brain WO Contrast   Final Result       1.Patchy areas of acute/recent infarction involving the cerebral   hemispheres bilaterally. The infarcts have a configuration suggesting   watershed territory infarcts.   2.Chronic ischemic changes as discussed.      Georgann Housekeeper, MD   07/13/2022 10:28 PM      CT Head WO  Contrast   Final Result       1.New region of loss of gray-white matter differentiation in the right   frontal lobe, concerning for acute infarction.    2.A few additional areas of poor gray-white matter differentiation for   example in the left occipital lobe may be artifactual but could be   correlated with MRI.   3.No evidence of acute intracranial hemorrhage or significant mass effect.      These urgent results were discussed with and acknowledged by Dr. Don Broach on   07/13/2022 3:20 PM.      Vara Guardian, MD   07/13/2022 3:21 PM      XR Chest AP Portable   Final Result      Interstitial markings have slightly increased, suggesting minimal edema. No   other change.      Wilmon Pali, MD   07/10/2022 4:49 PM      XR Chest AP Portable   Final Result    There is mild interstitial prominence suggesting mild   congestive heart failure/fluid overload in the setting of shortness of   breath      [name], MD   07/07/2022 12:32 PM      XR Chest AP Portable   Final Result       Hypoventilation with basilar atelectasis.      Charlott Rakes, MD   07/06/2022 3:35 PM      CT Abdomen Pelvis W IV And PO Cont   Final Result      1.Postoperative changes from bilateral nephrectomy and cystectomy.    2.Small amount of ascites including some areas which appear loculated.   Small collections of fluid and gas in the anterior midline abdomen may be   postoperative but superimposed infection is not excluded. No rim-enhancing   fluid collections or extravasation of oral contrast to suggest anastomotic   leak.      Demetrios Isaacs, MD   07/06/2022 5:51 AM      CT Head WO Contrast   Final Result       1.No acute intracranial abnormality is seen.      Leandro Reasoner, MD   07/06/2022 3:56 AM      XR Abdomen AP   Final Result      Enteric tube terminates in the distal stomach.      Elease Etienne, DO   07/05/2022 12:24 PM      XR Chest AP Portable   Final Result         1. Lines and tubes in adequate position. No pneumothorax.   2. Trace left pleural  effusion and bibasilar atelectasis.      Jasmine December D'Heureux, MD   06/30/2022 4:53 PM      XR Chest AP Portable   Final Result      Catheters as described.      Pankaj Dominica, MD   06/30/2022 2:07 PM  XR Chest AP Portable   Final Result    Right mainstem bronchial intubation. The tube should be   withdrawn about 4 cm.      Dr. Tildon Husky is aware.      Wynema Birch, MD   06/28/2022 4:56 PM      XR OR MISCOUNT   Final Result    No unexpected foreign body.      These critical results were discussed with and acknowledged by Angie R.,   the circulating nurse, who will communicate the findings to Everardo All,   MD on 06/28/2022 3:02 PM.         Bosie Helper, MD   06/28/2022 3:03 PM

## 2022-07-21 NOTE — UM Notes (Signed)
Continued Stay Review 07/21/22:    PATIENT NAME: Shelby Barron, Shelby Barron   DOB: [birth year]     Admit to Inpatient (Order #[id number]) on 06/28/22     Admit Diagnosis: Malignant neoplasm of urinary bladder, unspecified site [C67.9]  Malignant tumor of kidney, left [C64.2]  Bladder cancer [C67.9]  Facility: [facility]    Reason for Continued Stay  -82 year female admitted to inpatient at [facility] on 6/10. Patient past medical COPD, [name], type 2 diabetes, renal cell carcinoma status post partial bilateral nephrectomy, [name], new diagnosis of urothelial cancer.  She was recently admitted in May 2024 at which time she was transitioned from PD to HD.  At that time was diagnosed with urothelial cancer.  Patient presented for scheduled cystectomy and completion nephrectomy with urology. [name], [name], extubated and off pressors since 6/13. Patient remains for continued monitoring.    Vital Signs  Temp 99.3  HR 51  RR 36  BP 91/48  SPO2 93% on RA    Abnormal Labs   Latest Reference Range & Units 07/21/22 02:20   WBC 3.10 - 9.50 x10 3/uL 11.81 (H)   Hemoglobin 11.4 - 14.8 g/dL 7.8 (L)   Hematocrit 64.3 - 43.7 % 23.9 (L)   Platelet Count 142 - 346 x10 3/uL 405 (H)   RBC 3.90 - 5.10 x10 6/uL 2.61 (L)   Glucose 70 - 100 mg/dL 329 (H)   BUN 7 - 21 mg/dL 29 (H)   Creatinine 0.4 - 1.0 mg/dL 2.9 (H)   Potassium 3.5 - 5.3 mEq/L 3.4 (L)   EGFR >=60.0 mL/min/1.73 m2 15.4 (L)       Plan of Care  Per Medicine  #Toxic metabolic Encephalopathy/delirium  Patient was upgraded to ICU on 6/30 for lethargy and decreased responsiveness  CT head was repeated on 6/29, no acute finding  ABG consistent with respiratory alkalosis  EEG consistent with encephalopathy no seizure, MRI no acute stroke  Culture pending  CT abdomen with and without contrast after premedication did not show any leak or  abscess  Continue supportive care  - Failed SLP eval . Wcont tube feeds per nutrition recs. SLP advanced from NPO to clear liquid mildly thick on 7/2  - Palliative consulted:  Family is clear they would like no limits to care and patient should remain full code.     #s/p open cystectomy, bilateral nephrectomies, and resection of small bowel after intra-op enterotomy    #Mixed shock - hemorrhagic vs septic: Resolved  #Leukocytosis  - 6/10 small bowel enteretomy with 1L EBL. Went to the OR on June 12 for small bowel anastomosis, closure of mesenteric defect, closure of abdominal wall with wound VAC application. Wound VAC changed 6/15  - Surgery consulted, recommend CTAP with PO contrast that showed no anastomotic leak. Low concern for abscesses as they'd expect these to have a more loculated appearance in the interim between 6/17 and 7/1 imaging  - ID consulted, completed meropenem and Mycamine for 2 weeks on 7/3     #Acute CVA on the right frontal lobe  #History of CVA  - Hold home plavix  -Left upper extremity not moving at all unclear duration  -CT head showed right frontal infarction  - Neurology consulted, started ASA 81mg  daily cleared by GI. Long term would likely benefit from anticoagulation in setting of new onset A  fib but would favor waiting 10 days from onset due to size of infarcts and potential for hemorrhagic conversion which is 07/23/2022  - SBP goal 120-160  -Carotid Doppler showed plaque with no critical stenosis  -Repeat head CT done on 6/29 for lethargy showed no acute finding  -EEG encephalopathic pattern no seizure, repeat MRI no new infarction  -Continue reorientation, PT OT recommend SNF     #Acute hypoxemic respiratory failure  #COPD  Extubated on 6/13, currently on room air  Maintain SaO2 > 92%  Continue as needed neb treatment  X-ray repeated on 6/29 showed resolution of interstitial edema     #ESRD  HD Tues/Thurs/Sat  Continue dialysis per nephrology  Nephrology following along      #Nonfunctioning right permacath  #Right Quinton IJ cath  IR removed the nonfunctional permacath and right Quinton IJ cath and replaced with a new permacath on 6/26.     #HTN  On metoprolol   PRN IV labetalol for SBP > 180      New onset Afib with RVR  - cardiology following  -CHA2DS2-VASc score is 7, 11% yearly risk of stroke  - stable now on amiodarone gtt and metoprolol, cardiology recommended to switch amiodarone to 200 mg daily on 07/16/2022  -GI and general surgery cleared for anticoagulation  -Neurology recommended to hold anticoagulation for 10 days [till 07/23/2022] given the size of stroke        #Renal cell carcinoma  #Urothelial carcinoma  #Thrombocytopenia  - VTE ppx: SCDs, subcutaneous heparin     #Diabetes Mellitus type 2  -Sliding scale insulin     Medications  Current Facility-Administered Medications   Medication Dose Route Frequency Last Rate Last Admin    acetaminophen (TYLENOL) tablet 650 mg  650 mg Oral 4 times per day   650 mg at 07/19/22 1208    Or    acetaminophen (TYLENOL) 160 MG/5ML oral liquid 650 mg  650 mg Oral 4 times per day   650 mg at 07/21/22 0507    acetaminophen (TYLENOL) tablet 650 mg  650 mg Oral Q6H PRN   650 mg at 07/19/22 0940    albuterol-ipratropium (DUO-NEB) 2.5-0.5(3) mg/3 mL nebulizer 3 mL  3 mL Nebulization Q6H PRN        amiodarone (PACERONE) tablet 200 mg  200 mg per NG tube Daily   200 mg at 07/20/22 1826    aspirin chewable tablet 81 mg  81 mg per NG tube Daily   81 mg at 07/21/22 0859    benzocaine (HURRICAINE) 20 % mouth spray 1 spray  1 spray Mouth/Throat TID PRN   1 spray at 07/04/22 1102    carvedilol (COREG) tablet 12.5 mg  12.5 mg Oral Q12H SCH   12.5 mg at 07/21/22 0859    darbepoetin alfa (ARANESP) injection 40 mcg  0.45 mcg/kg Subcutaneous Weekly   40 mcg at 07/17/22 1827    dextrose (GLUCOSE) 40 % oral gel 15 g of glucose  15 g of glucose Oral PRN        Or    dextrose (D10W) 10% bolus 125 mL  12.5 g Intravenous PRN        Or    dextrose 50 % bolus 12.5 g   12.5 g Intravenous PRN        Or    glucagon (rDNA) (GLUCAGEN) injection 1 mg  1 mg Intramuscular PRN        diphenhydrAMINE (BENADRYL) injection 25 mg  25 mg  Intravenous Q6H PRN        glycopyrrolate (ROBINUL) injection 0.2 mg  0.2 mg Intravenous Q8H   0.2 mg at 07/21/22 0351    heparin (porcine) injection 5,000 Units  5,000 Units Subcutaneous Q12H SCH   5,000 Units at 07/21/22 0859    hydrALAZINE (APRESOLINE) injection 10 mg  10 mg Intravenous Q3H PRN   10 mg at 07/18/22 2331    insulin glargine (LANTUS) injection 7 Units  7 Units Subcutaneous QAM   7 Units at 07/21/22 0903    insulin lispro injection 1-8 Units  1-8 Units Subcutaneous Q6H        labetalol (NORMODYNE,TRANDATE) injection 10 mg  10 mg Intravenous Q15 Min PRN   10 mg at 07/19/22 0032    lidocaine 2% urethral/mucosal jelly   Each Nare Once PRN        naloxone (NARCAN) injection 0.2 mg  0.2 mg Intravenous PRN        oxyCODONE (ROXICODONE) immediate release tablet 2.5 mg  2.5 mg Oral Q4H PRN   2.5 mg at 07/21/22 0211    pantoprazole (PROTONIX) injection 40 mg  40 mg Intravenous BID   40 mg at 07/21/22 0859    phenol (CHLORASEPTIC) liquid 1 spray  1 spray Oral Q2H PRN   1 spray at 07/21/22 0558    thiamine (VITAMIN B-1) injection 100 mg  100 mg Intravenous Q24H SCH   100 mg at 07/21/22 1610       Primary Coverage:  Payor: MEDICARE [employer] / Plan: UHC MED ADV [id number] / Product Type: MANAGED MEDICARE /     UTILIZATION REVIEW CONTACT: Wendall Stade RN, BSN  Utilization Review   [facility]  [phone]   [phone]  Email: [email or web address]  Tax ID:  [id number]         NOTES TO REVIEWER:    This clinical review is based on/compiled from documentation provided by the treatment team within the patient's medical record.

## 2022-07-21 NOTE — Progress Notes (Signed)
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 s/p bilateral radical nephrectomies and  cystectomy on 06/28/22  - complicated by small bowel enterotomy and blood loss   New onset afib - on amiodarone  High grade urothelial bladder CA: s/p bilateral nephrectomies and cystectomy c/b mall bowel enterotomy and blood loss (~1L) on 06/28/22  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 - remains low, adequate iron  Encephalopathy - better   Leukocytosis - improving - on meropenem and micafungin  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   Hypophosphatemia - persists     Recommendations:   HD tomorrow     Case discussed with: no one   Delice Bison, MD  [facility]  [phone] (office)  X [phone] (FFX Spectra-Link)      Subjective: comfortably resting      Review of Systems:   Resting     Physical Exam:     Vitals:    07/21/22 0200 07/21/22 0400 07/21/22 0800 07/21/22 0859   BP: 134/63 143/65  150/67   Pulse: 75 83  80   Resp: (!) 36 (!) 27     Temp:  99 F (37.2 C) 97.8 F (36.6 C)    TempSrc:  Axillary Axillary    SpO2: 98% 97%     Weight:  90.4 kg (199 lb 4.7 oz)     Height:           Intake and Output Summary (Last 24 hours) at Date Time    Intake/Output Summary (Last 24 hours) at 07/21/2022 1017  Last data filed at 07/21/2022 0600  Gross per 24 hour   Intake 1075 ml   Output 2100 ml   Net -1025 ml         General: asleep  Cardiovascular: regular rate and rhythm  Lungs: bilateral air  entry  Abdomen: soft  Extremities: LUE edema  Access: R permacath     Meds:      Scheduled Meds: PRN Meds:    acetaminophen, 650 mg, Oral, 4 times per day   Or  acetaminophen, 650 mg, Oral, 4 times per day  amiodarone, 200 mg, per NG tube, Daily  aspirin, 81 mg, per NG tube, Daily  carvedilol, 12.5 mg, Oral, Q12H SCH  darbepoetin alfa, 0.45 mcg/kg, Subcutaneous, Weekly  glycopyrrolate, 0.2 mg, Intravenous, Q8H  heparin (porcine), 5,000 Units, Subcutaneous, Q12H SCH  insulin glargine, 7 Units, Subcutaneous, QAM  insulin lispro, 1-8 Units, Subcutaneous, Q6H  pantoprazole, 40 mg, Intravenous, BID  thiamine, 100 mg, Intravenous, Q24H SCH          Continuous Infusions:       acetaminophen, 650 mg, Q6H PRN  albuterol-ipratropium, 3 mL, Q6H PRN  benzocaine, 1 spray, TID PRN  dextrose, 15 g of glucose, PRN   Or  dextrose, 12.5 g, PRN   Or  dextrose, 12.5 g, PRN   Or  glucagon (rDNA), 1 mg,  PRN  diphenhydrAMINE, 25 mg, Q6H PRN  hydrALAZINE, 10 mg, Q3H PRN  labetalol, 10 mg, Q15 Min PRN  lidocaine 2% jelly, , Once PRN  naloxone, 0.2 mg, PRN  oxyCODONE, 2.5 mg, Q4H PRN  phenol, 1 spray, Q2H PRN              Labs:     Recent Labs   Lab 07/21/22  0220 07/20/22  0414 07/19/22  0216   WBC 11.81* 12.59* 13.86*   Hemoglobin 7.8* 7.2* 7.4*   Hematocrit 23.9* 21.6* 22.5*   Platelet Count 405* 403* 354*     Recent Labs   Lab 07/21/22  0220 07/20/22  0414 07/19/22  0216 07/18/22  0421 07/17/22  0431 07/16/22  0244   Sodium 137 135 139 139  More results in Results Review 134*   Potassium 3.4* 4.2 4.5 3.7  More results in Results Review 3.4*   Chloride 101 100 102 101  More results in Results Review 98*   CO2 24 22 23 25   More results in Results Review 27   BUN 29* 57* 45* 31*  More results in Results Review 32*   Creatinine 2.9* 4.8* 3.9* 2.8*  More results in Results Review 2.5*   Calcium 8.4 9.0 9.0 8.9  More results in Results Review 8.2   Albumin  --  1.6*  --  1.8*  --  1.8*   Phosphorus 2.9 4.2 3.9 2.4  More results in Results  Review 2.1*   Magnesium 1.8 2.0 1.9 1.8  More results in Results Review 1.7   Glucose 135* 79 196* 185*  More results in Results Review 166*   GFR 15.4* 8.4* 10.8* 16.1*  More results in Results Review 18.4*   More results in Results Review = values in this interval not displayed.           Signed by: Delice Bison, MD, FASN

## 2022-07-21 NOTE — Plan of Care (Signed)
Problem: Moderate/High Fall Risk Score >5  Goal: Patient will remain free of falls  Outcome: Progressing  Flowsheets (Taken 07/21/2022 2200)  High (Greater than 13):   HIGH-Consider use of low bed   HIGH-Initiate use of floor mats as appropriate   HIGH-Apply yellow "Fall Risk" arm band   HIGH-Bed alarm on at all times while patient in bed     Problem: Safety  Goal: Patient will be free from injury during hospitalization  Outcome: Progressing  Flowsheets (Taken 07/21/2022 2249)  Patient will be free from injury during hospitalization:   Assess patient's risk for falls and implement fall prevention plan of care per policy   Provide and maintain safe environment   Use appropriate transfer methods   Ensure appropriate safety devices are available at the bedside   Include patient/ family/ care giver in decisions related to safety   Hourly rounding   Assess for patients risk for elopement and implement Elopement Risk Plan per policy     Problem: Neurological Deficit  Goal: Neurological status is stable or improving  Outcome: Progressing  Flowsheets (Taken 07/21/2022 2249)  Neurological status is stable or improving:   Monitor/assess/document neurological assessment (Stroke: every 4 hours)   Monitor/assess NIH Stroke Scale   Re-assess NIH Stroke Scale for any change in status   Observe for seizure activity and initiate seizure precautions if indicated   Perform CAM Assessment     Problem: Every Day - Stroke  Goal: Neurological status is stable or improving  Outcome: Progressing  Flowsheets (Taken 07/21/2022 2249)  Neurological status is stable or improving:   Monitor/assess/document neurological assessment (Stroke: every 4 hours)   Monitor/assess NIH Stroke Scale   Re-assess NIH Stroke Scale for any change in status   Observe for seizure activity and initiate seizure precautions if indicated   Perform CAM Assessment

## 2022-07-21 NOTE — Progress Notes (Signed)
Infectious Diseases & Tropical Medicine  Progress Note    07/21/2022   Shelby Barron ZOX:[id number] is a 82 y.o. female,       Assessment:     Encephalopathy improving  Acute CVA  History of bladder cancer and renal failure  Status post radical cystectomy/bilateral nephrectomy, enterectomy and lysis of adhesion (06/28/2022)  S/p small bowel resection  CT scan of abdomen and pelvis-no abscess or acute inflammatory process (07/19/2022)  Severe anemia improving  Leukocytosis improving-likely reactive  End-stage renal disease on hemodialysis  Atrial fibrillation  Risk of aspiration    Plan:     Monitor without antibiotics  Continue local wound care  Follow-up chest x-ray  Continue probiotics  Monitor electrolytes and renal functions closely  Monitor clinically  Discussed with nursing staff  Discussed with patient's daughter in detail    ROS:     General:  no fever, awake, comfortable, daughter at bedside  HEENT: NG tube in place  Respiratory: No cough or shortness of breath  Gastrointestinal: Abdominal wall wound VAC in place, positive diarrhea per nursing staff  Genito-Urinary: no hematuria  Musculoskeletal: no edema  Neurological: awake and alert  Dermatological: No rash    Physical Examination:     Blood pressure 150/67, pulse 80, temperature 97.8 F (36.6 C), temperature source Axillary, resp. rate (!) 27, height 1.575 m (5\' 2" ), weight 90.4 kg (199 lb 4.7 oz), SpO2 97%.    General Appearance: Awake, weak, confused  HEENT: Pupils are equal, round, and reactive to light.   Lungs: Decreased breath sounds  Heart: Regular rate and rhythm  Chest: Symmetric chest wall expansion.   Abdomen: soft, no hepatosplenomegaly, no bowel sounds  Neurological: awake  Extremities: No edema    Laboratory And Diagnostic Studies:     Recent Labs     07/21/22  0220 07/20/22  0414   WBC 11.81* 12.59*   Hemoglobin 7.8* 7.2*   Hematocrit 23.9* 21.6*   Platelet Count 405* 403*     Recent Labs     07/21/22  0220 07/20/22  0414    Sodium 137 135   Potassium 3.4* 4.2   Chloride 101 100   CO2 24 22   BUN 29* 57*   Creatinine 2.9* 4.8*   Glucose 135* 79   Calcium 8.4 9.0     Recent Labs     07/20/22  0414   AST (SGOT) 10   ALT 9   Alkaline Phosphatase 97   Protein, Total 4.6*   Albumin 1.6*       Current Meds:      Scheduled Meds: PRN Meds:    acetaminophen, 650 mg, Oral, 4 times per day   Or  acetaminophen, 650 mg, Oral, 4 times per day  amiodarone, 200 mg, per NG tube, Daily  aspirin, 81 mg, per NG tube, Daily  carvedilol, 12.5 mg, Oral, Q12H SCH  darbepoetin alfa, 0.45 mcg/kg, Subcutaneous, Weekly  glycopyrrolate, 0.2 mg, Intravenous, Q8H  heparin (porcine), 5,000 Units, Subcutaneous, Q12H SCH  insulin glargine, 7 Units, Subcutaneous, QAM  insulin lispro, 1-8 Units, Subcutaneous, Q6H  pantoprazole, 40 mg, Intravenous, BID  thiamine, 100 mg, Intravenous, Q24H SCH        Continuous Infusions:     acetaminophen, 650 mg, Q6H PRN  albuterol-ipratropium, 3 mL, Q6H PRN  benzocaine, 1 spray, TID PRN  dextrose, 15 g of glucose, PRN   Or  dextrose, 12.5 g, PRN   Or  dextrose, 12.5 g, PRN   Or  glucagon (rDNA), 1 mg, PRN  diphenhydrAMINE, 25 mg, Q6H PRN  hydrALAZINE, 10 mg, Q3H PRN  labetalol, 10 mg, Q15 Min PRN  lidocaine 2% jelly, , Once PRN  naloxone, 0.2 mg, PRN  oxyCODONE, 2.5 mg, Q4H PRN  phenol, 1 spray, Q2H PRN          Terisha Losasso A. Janalyn Rouse, M.D.  07/21/2022  9:15 AM

## 2022-07-21 NOTE — Plan of Care (Signed)
Problem: Moderate/High Fall Risk Score >5  Goal: Patient will remain free of falls  Outcome: Progressing  Flowsheets (Taken 07/21/2022 1947)  High (Greater than 13):   HIGH-Consider use of low bed   HIGH-Pharmacy to initiate evaluation and intervention per protocol   HIGH-Utilize chair pad alarm for patient while in the chair   HIGH-Visual cue at entrance to patient's room   HIGH-Apply yellow "Fall Risk" arm band   HIGH-Initiate use of floor mats as appropriate   HIGH-Bed alarm on at all times while patient in bed     Problem: Compromised Moisture  Goal: Moisture level Interventions  Outcome: Progressing  Flowsheets (Taken 07/21/2022 1947)  Moisture level Interventions: Moisture wicking products, Moisture barrier cream     Problem: Compromised Activity/Mobility  Goal: Activity/Mobility Interventions  Outcome: Progressing  Flowsheets (Taken 07/21/2022 1947)  Activity/Mobility Interventions: Pad bony prominences, TAP Seated positioning system when OOB, Promote PMP, Reposition q 2 hrs / turn clock, Offload heels     Problem: Compromised Nutrition  Goal: Nutrition Interventions  Outcome: Progressing  Flowsheets (Taken 07/21/2022 1947)  Nutrition Interventions: Discuss nutrition at Rounds, I&Os, Document % meal eaten, Daily weights     Problem: Compromised Friction/Shear  Goal: Friction and Shear Interventions  Outcome: Progressing  Flowsheets (Taken 07/21/2022 1947)  Friction and Shear Interventions: Pad bony prominences, Off load heels, HOB 30 degrees or less unless contraindicated, Consider: TAP seated positioning, Heel foams     Problem: Inadequate Airway Clearance  Goal: Patent Airway maintained  Outcome: Progressing  Flowsheets (Taken 07/21/2022 1947)  Patent airway maintained:   Provide adequate fluid intake to liquefy secretions   Reposition patient every 2 hours and as needed unless able to self-reposition   Suction secretions as needed     Problem: Infection Prevention  Goal: Free from infection  Outcome:  Progressing  Flowsheets (Taken 07/21/2022 1947)  Free from infection:   Encourage/assist patient to turn, cough and perform deep breathing every 2 hours   Monitor/assess vital signs   Monitor/assess output from surgical drain if present   Assess for signs and symptoms of infection   Monitor/assess lab values and report abnormal values   Assess incision for evidence of healing     Problem: Impaired Mobility  Goal: Mobility/Activity is maintained at optimal level for patient  Outcome: Progressing  Flowsheets (Taken 07/21/2022 1947)  Mobility/activity is maintained at optimal level for patient:   Encourage independent activity per ability   Consult/collaborate with Physical Therapy and/or Occupational Therapy     Problem: Constipation  Goal: Fluid and electrolyte balance are achieved/maintained  Outcome: Progressing  Flowsheets (Taken 07/21/2022 1947)  Fluid and electrolyte balance are achieved/maintained:   Monitor/assess lab values and report abnormal values   Assess and reassess fluid and electrolyte status  Goal: Elimination patterns are normal or improving  Outcome: Progressing  Flowsheets (Taken 07/21/2022 1947)  Elimination patterns are normal or improving: Assess for and discuss C. diff screening with LIP     Problem: Safety  Goal: Patient will be free from injury during hospitalization  Outcome: Progressing  Flowsheets (Taken 07/21/2022 1947)  Patient will be free from injury during hospitalization:   Assess patient's risk for falls and implement fall prevention plan of care per policy   Ensure appropriate safety devices are available at the bedside   Assess for patients risk for elopement and implement Elopement Risk Plan per policy   Provide alternative method of communication if needed ([employer], writing)   Include patient/ family/ care giver  in decisions related to safety   Provide and maintain safe environment   Use appropriate transfer methods   Hourly rounding     Problem: Pain  Goal: Pain at adequate  level as identified by patient  Outcome: Progressing  Flowsheets (Taken 07/21/2022 1947)  Pain at adequate level as identified by patient:   Identify patient comfort function goal   Assess for risk of opioid induced respiratory depression, including snoring/sleep apnea. Alert healthcare team of risk factors identified.   Reassess pain within 30-60 minutes of any procedure/intervention, per Pain Assessment, Intervention, Reassessment (AIR) Cycle   Evaluate patient's satisfaction with pain management progress   Consult/collaborate with Physical Therapy, Occupational Therapy, and/or Speech Therapy   Include patient/patient care companion in decisions related to pain management as needed   Assess pain on admission, during daily assessment and/or before any "as needed" intervention(s)   Offer non-pharmacological pain management interventions   Consult/collaborate with Pain Service   Evaluate if patient comfort function goal is met     Problem: Side Effects from Pain Analgesia  Goal: Patient will experience minimal side effects of analgesic therapy  Outcome: Progressing  Flowsheets (Taken 07/21/2022 1947)  Patient will experience minimal side effects of analgesic therapy:   Monitor/assess patient's respiratory status (RR depth, effort, breath sounds)   Prevent/manage side effects per LIP orders (i.e. nausea, vomiting, pruritus, constipation, urinary retention, etc.)   Evaluate for opioid-induced sedation with appropriate assessment tool (i.e. POSS)   Assess for changes in cognitive function     Problem: Hemodynamic Status: Cardiac  Goal: Stable vital signs and fluid balance  Outcome: Progressing  Flowsheets (Taken 07/21/2022 1947)  Stable vital signs and fluid balance:   Monitor lab values   Assess signs and symptoms associated with cardiac rhythm changes     Problem: Renal Instability  Goal: Fluid and electrolyte balance are achieved/maintained  Outcome: Progressing  Flowsheets (Taken 07/21/2022 1947)  Fluid and electrolyte  balance are achieved/maintained:   Monitor/assess lab values and report abnormal values   Assess and reassess fluid and electrolyte status   Observe for cardiac arrhythmias   Monitor for muscle weakness   Follow fluid restrictions/IV/PO parameters     Problem: Fluid and Electrolyte Imbalance/ Endocrine  Goal: Fluid and electrolyte balance are achieved/maintained  Outcome: Progressing  Flowsheets (Taken 07/21/2022 1947)  Fluid and electrolyte balance are achieved/maintained:   Monitor/assess lab values and report abnormal values   Assess and reassess fluid and electrolyte status     Problem: Diabetes: Glucose Imbalance  Goal: Blood glucose stable at established goal  Outcome: Progressing  Flowsheets (Taken 07/21/2022 1947)  Blood glucose stable at established goal:   Monitor lab values   Monitor intake and output.  Notify LIP if urine output is < 30 mL/hour.   Include patient/family in decisions related to nutrition/dietary selections   Coordinate medication administration with meals, as indicated   Ensure appropriate consults are obtained (Nutrition, Diabetes Education, and Case Management)   Ensure appropriate diet and assess tolerance   Ensure adequate hydration   Monitor/assess vital signs   Ensure patient/family has adequate teaching materials   Assess for hypoglycemia /hyperglycemia   Follow fluid restrictions/IV/PO parameters     Problem: Inadequate Gas Exchange  Goal: Patent Airway maintained  Outcome: Progressing  Flowsheets (Taken 07/21/2022 1947)  Patent airway maintained:   Provide adequate fluid intake to liquefy secretions   Reposition patient every 2 hours and as needed unless able to self-reposition   Suction secretions as needed  Problem: Neurological Deficit  Goal: Neurological status is stable or improving  Outcome: Progressing  Flowsheets (Taken 07/21/2022 1947)  Neurological status is stable or improving:   Monitor/assess/document neurological assessment (Stroke: every 4 hours)   Observe for seizure  activity and initiate seizure precautions if indicated   Perform CAM Assessment   Monitor/assess NIH Stroke Scale   Re-assess NIH Stroke Scale for any change in status     Problem: Potential for Aspiration  Goal: Risk of aspiration will be minimized  Outcome: Progressing  Flowsheets (Taken 07/21/2022 1947)  Risk of aspiration will be minimized:   Assess/monitor ability to swallow using dysphagia screen: Keep patient NPO if patient fails screening   Place swallow precaution signage above bed and supervise patient during oral intake   Monitor/assess for signs of aspiration (tachypnea, cough, wheezing, clearing throat, hoarseness after eating, decrease in SaO2)   Consult/collaborate/follow recommended modified texture diet/thicken liquids as indicated by Speech Pathologist   Place patient up in chair to eat, if possible/head of bed up 90 degrees to eat if unable to be out of bed   Instruct patient to take small bites, small single sips of liquid, and do not use a straw     Problem: Every Day - Stroke  Goal: Mobility/Activity is maintained at optimal level for patient  Outcome: Progressing  Flowsheets (Taken 07/21/2022 1947)  Mobility/activity is maintained at optimal level for patient:   Encourage independent activity per ability   Consult/collaborate with Physical Therapy and/or Occupational Therapy  Goal: Elimination patterns are normal or improving  Outcome: Progressing  Flowsheets (Taken 07/21/2022 1947)  Elimination patterns are normal or improving: Assess for and discuss C. diff screening with LIP  Goal: Neurological status is stable or improving  Outcome: Progressing  Flowsheets (Taken 07/21/2022 1947)  Neurological status is stable or improving:   Monitor/assess/document neurological assessment (Stroke: every 4 hours)   Observe for seizure activity and initiate seizure precautions if indicated   Perform CAM Assessment   Monitor/assess NIH Stroke Scale   Re-assess NIH Stroke Scale for any change in status  Goal:  Core/Quality measure requirements - Daily  Outcome: Progressing  Flowsheets (Taken 07/21/2022 1947)  Core/Quality measure requirements - Daily:   VTE Prevention: Ensure anticoagulant(s) administered and/or anti-embolism stockings/devices documented by end of day 2   Ensure antithrombotic administered or contraindication documented by LIP by end of day 2   Once lipid panel has resulted, check LDL. Contact provider for statin order if LDL > 70 (or ensure contraindication documented by LIP).   Continue stroke education (must include Modifiable Risk Factors, Warning Signs and Symptoms of Stroke, Activation of Emergency Medical System and Follow-up Appointments). Ensure handout has been given and documented.  Goal: Stable vital signs and fluid balance  Outcome: Progressing  Flowsheets (Taken 07/21/2022 1947)  Stable vital signs and fluid balance:   Position patient for maximum circulation/cardiac output   Monitor intake and output. Notify LIP if urine output is < 30 mL/hour.   Encourage oral fluid intake   Monitor and assess vitals every 4 hours or as ordered and hemodynamic parameters   Apply telemetry monitor as ordered  Goal: Patient will maintain adequate oxygenation  Outcome: Progressing  Flowsheets (Taken 07/21/2022 1947)  Patient will maintain adequate oxygenation:   Suction secretions as needed   Sleep Apnea: Encourage patient to speak to PCP regarding sleep apnea/sleep study ([place] only)   Maintain SpO2 of greater than 92%   Sleep Apnea: Assess if previously tested or on CPAP  Goal: Patient's risk of aspiration will be minimized  Outcome: Progressing  Flowsheets (Taken 07/21/2022 1947)  Patient's risk of aspiration will be minimized:   Complete new dysphagia screen for any change in status: Keep patient NPO if patient fails   Monitor/assess for signs of aspiration (tachypnea, cough, wheezing, clearing throat, hoarseness after eating, decrease in SaO2   Order modified texture diet as recommend by Speech Pathologist    Instruct patient to take small bites   Supervise patient during oral intake   Thicken liquids as recommended by Speech Pathologist   Keep head of bed up a minimum of 30 degrees when hemodynamically stable   Instruct patient to take small single sips of liquid   Do not use a straw   HOB up 90 degrees to eat if unable to be OOB   Place patient up in chair to eat, if possible   Assess and monitor ability to swallow   Consult/Collaborate with Speech Pathologist for dysphagia   Place swallow precaution signage above bed  Goal: Nutritional intake is adequate  Outcome: Progressing  Flowsheets (Taken 07/21/2022 1947)  Nutritional intake is adequate:   Consult/collaborate with Clinical Nutritionist   Consult/collaborate with Speech Therapy (swallow evaluations)  Goal: Skin integrity is maintained or improved  Outcome: Progressing  Flowsheets (Taken 07/21/2022 1947)  Skin integrity is maintained or improved:   Assess Braden Scale every shift   Turn or reposition patient every 2 hours or as needed unless able to reposition self   Keep skin clean and dry   Relieve pressure to bony prominences   Collaborate with Wound, Ostomy, and Continence Nurse   Avoid shearing   Utilize specialty bed   Keep head of bed 30 degrees or less (unless contraindicated)   Encourage use of lotion/moisturizer on skin   Monitor patient's hygiene practices   Increase activity as tolerated/progressive mobility  Goal: Neurovascular status is stable or improving  Outcome: Progressing  Flowsheets (Taken 07/21/2022 1947)  Neurovascular status is stable or improving:   Monitor/assess neurovascular status (pulses, capillary refill, pain, paresthesia, presence of edema)   Monitor/assess for signs of Venous Thrombus Emboli (edema of calf/thigh redness, pain)   Monitor/assess site of invasive procedure for signs of bleeding  Goal: Effective coping demonstrated  Outcome: Progressing  Flowsheets (Taken 07/21/2022 1947)  Effective coping demonstrated:   Assess/report to LIP  uncontrolled anxiety, depression, or ineffective coping   Offer reassurance to decrease anxiety  Goal: Will be able to express needs and understand communication  Outcome: Progressing  Flowsheets (Taken 07/21/2022 1947)  Able to express needs and understand communication:   Provide alternative method of communication if needed   Include patient care companion in decisions related to communication   Patient/patient care companion demonstrates understanding on disease process, treatment plan, medications and discharge plan   Consult/collaborate with Speech Language Pathology (SLP)   Consult/collaborate with Case Management/Social Work     Problem: Pain interferes with ability to perform ADL  Goal: Pain at adequate level as identified by patient  Outcome: Progressing  Flowsheets (Taken 07/21/2022 1947)  Pain at adequate level as identified by patient:   Identify patient comfort function goal   Assess for risk of opioid induced respiratory depression, including snoring/sleep apnea. Alert healthcare team of risk factors identified.   Reassess pain within 30-60 minutes of any procedure/intervention, per Pain Assessment, Intervention, Reassessment (AIR) Cycle   Evaluate patient's satisfaction with pain management progress   Consult/collaborate with Physical Therapy, Occupational Therapy, and/or Speech Therapy  Include patient/patient care companion in decisions related to pain management as needed   Assess pain on admission, during daily assessment and/or before any "as needed" intervention(s)   Offer non-pharmacological pain management interventions   Consult/collaborate with Pain Service   Evaluate if patient comfort function goal is met

## 2022-07-21 NOTE — SLP Progress Note (Signed)
[facility]  Speech Therapy Treatment Note    Patient:  Shelby Barron MRN#:  [id number]  Unit:  [place] [facility]ARDIOV[facility]URO INTENSIVE C[facility] Room/Bed:  A2702/A2702-01      Time of treatment:   SLP Received On: 07/21/22  Start Time: 1400  Stop Time: 1420  Time Calculation (min): 20 min       Personal Protective Equipment (PPE)  Gloves and Procedure Mask with Shield    Subjective:     Pt is alert/ some verbal responses , confused at times       Objective: Oropharyngeal analysis during PO trials in order to determine LRD,development and training of swallow strategies in order to improve swallow function and decrease risk of penetration/aspiration;  therapeutic feedings by SLP  and safe swallowing precautions education.       Assessment: pt  presents with some improvement in swallow function as evidenced by mild delay in swallow trigger and mild delay in bolus transit times with efficient HLE upon subjective digital palpation. Pt tolerated 3 oz of pudding and 4 fl oz of nectar thickened liquid via straw / some retrograde back flow of liquid trial into the oral cavity noted if bolus presentation was paced is noted.         Plan/Recommendations:  full liquid   and Mildly thick  MT2  Slow feeding rate, small bites / sips , upright position during and after the meals , Reflux precautions , full assistance with meals , and liquid wash        Charges Minutes   SLP treat    Swallow treat 20       07/21/2022  Presley Raddle MS [facility] SLP   07/21/2022 3:28 PM  [phone]

## 2022-07-22 LAB — BASIC METABOLIC PANEL
Anion Gap: 14 (ref 5.0–15.0)
BUN: 43 mg/dL — ABNORMAL HIGH (ref 7–21)
CO2: 21 mEq/L (ref 17–29)
Calcium: 8.8 mg/dL (ref 7.9–10.2)
Chloride: 101 mEq/L (ref 99–111)
Creatinine: 3.9 mg/dL — ABNORMAL HIGH (ref 0.4–1.0)
GFR: 10.8 mL/min/{1.73_m2} — ABNORMAL LOW (ref >=60.0)
Glucose: 151 mg/dL — ABNORMAL HIGH (ref 70–100)
Potassium: 3.9 mEq/L (ref 3.5–5.3)
Sodium: 136 mEq/L (ref 135–145)

## 2022-07-22 LAB — CBC
Absolute nRBC: 0.02 10*3/uL — ABNORMAL HIGH (ref <=0.00)
Hematocrit: 22.3 % — ABNORMAL LOW (ref 34.7–43.7)
Hemoglobin: 7.3 g/dL — ABNORMAL LOW (ref 11.4–14.8)
MCH: 30 pg (ref 25.1–33.5)
MCHC: 32.7 g/dL (ref 31.5–35.8)
MCV: 91.8 fL (ref 78.0–96.0)
MPV: 9.8 fL (ref 8.9–12.5)
Platelet Count: 401 10*3/uL — ABNORMAL HIGH (ref 142–346)
RBC: 2.43 10*6/uL — ABNORMAL LOW (ref 3.90–5.10)
RDW: 15 % (ref 11–15)
WBC: 12.23 10*3/uL — ABNORMAL HIGH (ref 3.10–9.50)
nRBC %: 0.2 /100 WBC — ABNORMAL HIGH (ref <=0.0)

## 2022-07-22 LAB — PHOSPHORUS: Phosphorus: 3.5 mg/dL (ref 2.3–4.7)

## 2022-07-22 LAB — MAGNESIUM: Magnesium: 1.8 mg/dL (ref 1.6–2.6)

## 2022-07-22 LAB — WHOLE BLOOD GLUCOSE POCT
Whole Blood Glucose POCT: 114 mg/dL — ABNORMAL HIGH (ref 70–100)
Whole Blood Glucose POCT: 118 mg/dL — ABNORMAL HIGH (ref 70–100)
Whole Blood Glucose POCT: 136 mg/dL — ABNORMAL HIGH (ref 70–100)

## 2022-07-22 MED ORDER — SODIUM CHLORIDE 0.9 % IV BOLUS
250.0000 mL | INTRAVENOUS | Status: AC | PRN
Start: 2022-07-22 — End: 2022-07-22

## 2022-07-22 MED ORDER — SODIUM CHLORIDE 0.9 % IV BOLUS
100.0000 mL | INTRAVENOUS | Status: AC | PRN
Start: 2022-07-22 — End: 2022-07-22

## 2022-07-22 MED ORDER — THIAMINE (VITAMIN B1) 100 MG PO TABS (WRAP)
100.0000 mg | ORAL_TABLET | Freq: Every day | ORAL | Status: DC
Start: 2022-07-23 — End: 2022-08-18
  Administered 2022-07-23 – 2022-08-18 (×24): 100 mg via NASOGASTRIC
  Filled 2022-07-22 (×28): qty 1

## 2022-07-22 MED ORDER — PANTOPRAZOLE ORAL SUSPENSION 40 MG/20 ML UD
40.0000 mg | Freq: Two times a day (BID) | ORAL | Status: DC
Start: 2022-07-22 — End: 2022-08-18
  Administered 2022-07-22 – 2022-08-18 (×45): 40 mg via NASOGASTRIC
  Filled 2022-07-22 (×53): qty 20

## 2022-07-22 MED ORDER — PANTOPRAZOLE ORAL SUSPENSION 40 MG/20 ML UD
40.0000 mg | Freq: Two times a day (BID) | ORAL | Status: DC
Start: 2022-07-22 — End: 2022-07-22

## 2022-07-22 NOTE — Plan of Care (Addendum)
Problem: Moderate/High Fall Risk Score >5  Goal: Patient will remain free of falls  Outcome: Progressing  Flowsheets (Taken 07/22/2022 2019)  High (Greater than 13):   HIGH-Apply yellow "Fall Risk" arm band   HIGH-Initiate use of floor mats as appropriate   HIGH-Consider use of low bed     Problem: Infection Prevention  Goal: Free from infection  Outcome: Progressing  Flowsheets (Taken 07/22/2022 2019)  Free from infection:   Monitor/assess vital signs   Encourage/assist patient to turn, cough and perform deep breathing every 2 hours   Assess incision for evidence of healing   Monitor/assess output from surgical drain if present   Monitor/assess lab values and report abnormal values   Assess for signs and symptoms of infection     Problem: Neurological Deficit  Goal: Neurological status is stable or improving  Outcome: Progressing  Flowsheets (Taken 07/22/2022 2019)  Neurological status is stable or improving:   Monitor/assess/document neurological assessment (Stroke: every 4 hours)   Monitor/assess NIH Stroke Scale   Re-assess NIH Stroke Scale for any change in status   Observe for seizure activity and initiate seizure precautions if indicated   Perform CAM Assessment     Problem: Every Day - Stroke  Goal: Neurological status is stable or improving  Outcome: Progressing  Flowsheets (Taken 07/22/2022 2019)  Neurological status is stable or improving:   Monitor/assess/document neurological assessment (Stroke: every 4 hours)   Monitor/assess NIH Stroke Scale   Re-assess NIH Stroke Scale for any change in status   Observe for seizure activity and initiate seizure precautions if indicated   Perform CAM Assessment

## 2022-07-22 NOTE — Plan of Care (Signed)
Problem: Renal Instability  Goal: Fluid and electrolyte balance are achieved/maintained  Flowsheets (Taken 07/21/2022 1947 by Natale Lay, RN)  Fluid and electrolyte balance are achieved/maintained:   Monitor/assess lab values and report abnormal values   Assess and reassess fluid and electrolyte status   Observe for cardiac arrhythmias   Monitor for muscle weakness   Follow fluid restrictions/IV/PO parameters     Problem: Patient Receiving Advanced Renal Therapies  Goal: Therapy access site remains intact  Flowsheets (Taken 07/20/2022 2218 by Annalee Genta, RN)  Therapy access site remains intact: Assess therapy access site

## 2022-07-22 NOTE — Treatment Plan (Cosign Needed)
4 eyes in 4 hours pressure injury assessment note:      Completed with:   Unit & Time admitted:              Bony Prominences: Check appropriate box; if wound is present enter wound assessment in LDA     Occiput:                 [x]WNL  [] Wound present  Face:                     [x]WNL  [] Wound present  Ears:                      [x]WNL  [] Wound present  Spine:                    [x]WNL  [] Wound present  Shoulders:             [x]WNL  [] Wound present  Elbows:                  [x]WNL  [] Wound present  Sacrum/coccyx:     []WNL  [x] Wound present  Ischial Tuberosity:  [x]WNL  [] Wound present  Trochanter/Hip:      [x]WNL  [] Wound present  Knees:                   [x]WNL  [] Wound present  Ankles:                   [x]WNL  [] Wound present  Heels:                    [x]WNL  [] Wound present  Other pressure areas:  [] Wound location       Device related: [] Device name:         LDA completed if wound present: Yes  Consult WOCN if necessary    Other skin related issues, ie tears, rash, etc, document in Integumentary flowsheet

## 2022-07-22 NOTE — Progress Notes (Signed)
HD TX Started via R CVC, working well, alert & awake, responsive, VS WDL, sats 100 room air, wound vac noted, Report received from Raetavia RN.   07/22/22 1000   Bedside Nurse Communication   Name of bedside RN - pre dialysis Rogelia Rohrer RN   Treatment Initiation- With Dialysis Precautions   Time Out/Safety Check Completed Yes   Consent for HD signed for this hospitalization (Date) 06/01/22   Consent for HD signed for this hospitalization (Time) 1516   Preferred language No   Blood Consent Verified N/A   Dialysis Precautions All Connections Secured   Dialysis Treatment Type Routine   Is patient diabetic? Yes   RO/Hemodialysis Cabin crew   Is Total Chlorine less than 0.1 ppm? Yes   Orignial Total Chlorine Testing Time 1000   At 4 Hour Total Chlorine Testing Time 1400   RO/Hemodialysis Arts development officer Number 5    Machine Serial Number [id number]   RO # [id number]   RO Serial # [id number]   Water Hardness NA   pH 7.2   Pressure Test Verified Yes   Alarms Verified Passed   Machine Temperature 97.7 F (36.5 C)   Alarms Verified Yes   Na+ mEq (Machine) 138 mEq   Bicarb mEq (Machine) 35 mEq   Hemodialysis Conductivity (Machine) 13.6   Hemodialysis Conductivity (Meter) 13.8   Dialyzer Lot Number [id number]   Tubing Lot Number [id number]   RO Machine Log Completed Yes   Hepatitis Status   HBsAg (Antigen) Result Negative   HBsAg Date Drawn 07/03/22   HBsAg Repeat Draw Due Date 07/31/22   Dialysis Weight   Pre-Treatment Weight (Kg) 90   Scale Type ICU Bed Scale   Vitals   Temp 99.2 F (37.3 C)   Heart Rate 66   Resp Rate (!) 37   BP 165/72   SpO2 99 %   O2 Device None (Room air)   Assessment   Mental Status Does not follow commands   Cardiac (WDL) X   Cardiac Regularity Irregular   Cardiac Symptoms Return to [facility]   Cardiac Rhythm Normal sinus rhythm   Respiratory  WDL   Respiratory Pattern Return to WDL   Bilateral Breath Sounds Diminished   R Breath Sounds Diminished   L Breath Sounds Diminished   Cough  Spontaneous   Edema  WDL   Generalized Edema Non Pitting Edema   RLE Edema +1   LLE Edema +1   General Skin Color Appropriate for ethnicity   Skin Condition/Temp Return to [facility]   Gastrointestinal (WDL) X   Abdomen Inspection   (WOUND VAC)   GI Conditions Diarrhea   Mobility Bed   Permacath Catheter - Tunneled 07/14/22 Subclavian vein catheter Right   Placement Date/Time: 07/14/22 1404   Inserted by: Dr. Welton Flakes  Access Type: Subclavian vein catheter  Orientation: Right  Central Line Infection Prevention Education provided?: Yes  Hand Hygiene: Alcohol based hand scrub;Soap and water  Line cart u...   Line necessity reviewed? Apheresis/hemodialysis   Site Assessment Clean;Dry   Catheter Lumen Volume Venous 1.6 mL   Catheter Lumen Volume Arterial 1.6 mL   Dressing Status and Intervention Dressing Intact;Clean & Dry   Tego/Curos Caps on Catheter Yes   NEW Tego/Curos Caps placed (Date) 07/22/22   APHERESIS ONLY:  Access  Red   APHERESIS ONLY: Return Blue   End Caps Free From Blood Yes   Line Care Connections checked and tightened   Dressing  Type Transparent;Biopatch   Dressing Status Clean;Intact;Dry   Dressing/Line Intervention Caps changed   Pain Assessment   Charting Type Assessment   Pain Scale Used CPOT   Numeric Pain Scale   Pain Score 0   Multiple Pain Sites No   Hemodialysis Comments   Pre-Hemodialysis Comments TIMEOUT & SAEFTY CHECKS DONE

## 2022-07-22 NOTE — Progress Notes (Signed)
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 and  cystectomy on 06/28/22  - complicated by small bowel enterotomy and blood loss   New onset afib - on amiodarone  High grade urothelial bladder CA: s/p bilateral nephrectomies and cystectomy c/b mall bowel enterotomy and blood loss (~1L) on 06/28/22  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 - remains low, adequate iron - on aranesp  Encephalopathy - better   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   Hypophosphatemia - better    Recommendations:   HD today    Case discussed with: no one   Delice Bison, MD  [facility]  [phone] (office)  X [phone] (FFX Spectra-Link)      Subjective: asleep      Review of Systems:   Resting     Physical Exam:     Vitals:    07/21/22 2018 07/22/22 0000 07/22/22 0145 07/22/22 0400   BP: 160/69 179/70 155/71 176/81   Pulse: 72 73 65 75   Resp:  (!) 26 (!) 33 (!) 35   Temp:  99.4 F (37.4 C)  99.4 F (37.4 C)   TempSrc:  Axillary  Axillary   SpO2:  97% 99% 98%   Weight:    90.4 kg (199 lb 4.7 oz)   Height:           Intake and Output Summary (Last 24 hours) at Date Time    Intake/Output Summary (Last 24 hours) at 07/22/2022 0634  Last data filed at 07/22/2022 0600  Gross per 24 hour   Intake 570 ml   Output 25 ml   Net 545 ml         General: asleep  Cardiovascular: regular rate and rhythm  Lungs: bilateral air entry  Abdomen: soft  Extremities: LUE edema  Access: R  permacath     Meds:      Scheduled Meds: PRN Meds:    acetaminophen, 650 mg, Oral, 4 times per day   Or  acetaminophen, 650 mg, Oral, 4 times per day  amiodarone, 200 mg, per NG tube, Daily  aspirin, 81 mg, per NG tube, Daily  carvedilol, 12.5 mg, Oral, Q12H SCH  darbepoetin alfa, 0.45 mcg/kg, Subcutaneous, Weekly  glycopyrrolate, 0.2 mg, Intravenous, Q8H  heparin (porcine), 5,000 Units, Subcutaneous, Q12H SCH  insulin glargine, 7 Units, Subcutaneous, QAM  insulin lispro, 1-8 Units, Subcutaneous, Q6H  pantoprazole, 40 mg, Intravenous, BID  thiamine, 100 mg, Intravenous, Q24H SCH          Continuous Infusions:       acetaminophen, 650 mg, Q6H PRN  albuterol-ipratropium, 3 mL, Q6H PRN  benzocaine, 1 spray, TID PRN  dextrose, 15 g of glucose, PRN   Or  dextrose, 12.5 g, PRN   Or  dextrose, 12.5 g, PRN   Or  glucagon (rDNA), 1 mg, PRN  diphenhydrAMINE, 25 mg, Q6H PRN  hydrALAZINE, 10 mg, Q3H PRN  labetalol, 10 mg, Q15 Min PRN  lidocaine 2% jelly, , Once PRN  naloxone, 0.2 mg, PRN  oxyCODONE, 2.5 mg, Q4H PRN  phenol, 1 spray, Q2H PRN              Labs:     Recent Labs   Lab 07/22/22  0218 07/21/22  0220 07/20/22  0414   WBC 12.23* 11.81* 12.59*   Hemoglobin 7.3* 7.8* 7.2*   Hematocrit 22.3* 23.9* 21.6*   Platelet Count 401* 405* 403*     Recent Labs   Lab 07/22/22  0219 07/21/22  0220 07/20/22  0414 07/19/22  0216 07/18/22  0421 07/17/22  0431 07/16/22  0244   Sodium 136 137 135  More results in Results Review 139  More results in Results Review 134*   Potassium 3.9 3.4* 4.2  More results in Results Review 3.7  More results in Results Review 3.4*   Chloride 101 101 100  More results in Results Review 101  More results in Results Review 98*   CO2 21 24 22   More results in Results Review 25  More results in Results Review 27   BUN 43* 29* 57*  More results in Results Review 31*  More results in Results Review 32*   Creatinine 3.9* 2.9* 4.8*  More results in Results Review 2.8*  More results in Results Review 2.5*   Calcium  8.8 8.4 9.0  More results in Results Review 8.9  More results in Results Review 8.2   Albumin  --   --  1.6*  --  1.8*  --  1.8*   Phosphorus 3.5 2.9 4.2  More results in Results Review 2.4  More results in Results Review 2.1*   Magnesium 1.8 1.8 2.0  More results in Results Review 1.8  More results in Results Review 1.7   Glucose 151* 135* 79  More results in Results Review 185*  More results in Results Review 166*   GFR 10.8* 15.4* 8.4*  More results in Results Review 16.1*  More results in Results Review 18.4*   More results in Results Review = values in this interval not displayed.           Signed by: Delice Bison, MD, FASN

## 2022-07-22 NOTE — Progress Notes (Signed)
Brief Nutrition Support Note  [facility] Clinical Nutrition   Name: Shelby Barron 82 y.o. female    MRN: [id number]    Start Nepro via NDT @ 60 ml/hr over 12 hrs (6pm-6am) + 100 ml FW flushes Q4H + 1 pkg Prosource    This will provide 1376 kcal, 78 g protein, 523 ml free water (1123 ml w/ flushes) which meets 88% kcal and 75% of protein needs   Glucerna TID   - ONS provides 220 kcal, 10 g protein per serving   Monitor diet order/SLP assessments  Encourage oral intake    -----------------------------------------------------------------------------------------------------------------    Pt diet advanced to full liquids, had half of oatmeal this morning. Spoke with SLP who stated pt is improving with swallow functioning. Plan to transition to nocturnal tube feeds to promote appetite during the day, will also add Glucerna TID.    ESTIMATED NEEDS    Total Daily Energy Needs: 1566 to 2001 kcal  Method for Calculating Energy Needs: 18 kcal - 23 kcal per kg  at 87 kg (Actual body weight)  Rationale: obese, noncritical, HD       Total Daily Protein Needs: 104.4 to 130.5 g  Method for Calculating Protein Needs: 2 g - 2.5 g per kg at 52.2 kg (Ideal body weight)  Rationale: obese, noncritical, HD      Total Daily Fluid Needs: 1044 to 1305 ml  Method for Calculating Fluid Needs: 20 ml - 25 ml  per kg at 52.2 kg (Ideal body weight)  Rationale: bmi, age      Pertinent labs:  Recent Labs   Lab 07/22/22  0219 07/22/22  0218 07/21/22  0220 07/20/22  0414 07/19/22  0216 07/18/22  0421   Sodium 136  --  137 135 139 139   Potassium 3.9  --  3.4* 4.2 4.5 3.7   Chloride 101  --  101 100 102 101   CO2 21  --  24 22 23 25    BUN 43*  --  29* 57* 45* 31*   Creatinine 3.9*  --  2.9* 4.8* 3.9* 2.8*   Glucose 151*  --  135* 79 196* 185*   Calcium 8.8  --  8.4 9.0 9.0 8.9   Magnesium 1.8  --  1.8 2.0 1.9 1.8   Phosphorus 3.5  --  2.9 4.2 3.9 2.4   GFR 10.8*  --  15.4* 8.4* 10.8* 16.1*   WBC  --  12.23* 11.81* 12.59* 13.86* 14.15*   Hematocrit  --  22.3*  23.9* 21.6* 22.5* 24.5*   Hemoglobin  --  7.3* 7.8* 7.2* 7.4* 7.8*     Marisa Sprinkles, RD, CNSC  Dietitian Clinical Specialist I  Spectralink [phone]

## 2022-07-22 NOTE — Plan of Care (Incomplete)
See flowsheets and MAR for treatment and Intervention  Problem: Compromised Activity/Mobility  Goal: Activity/Mobility Interventions  Outcome: Progressing  Flowsheets (Taken 07/22/2022 0800)  Activity/Mobility Interventions: Pad bony prominences, TAP Seated positioning system when OOB, Promote PMP, Reposition q 2 hrs / turn clock, Offload heels     Problem: Compromised Friction/Shear  Goal: Friction and Shear Interventions  Outcome: Progressing  Flowsheets (Taken 07/22/2022 0800)  Friction and Shear Interventions: Pad bony prominences, Off load heels, HOB 30 degrees or less unless contraindicated, Consider: TAP seated positioning, Heel foams     Problem: Constipation  Goal: Fluid and electrolyte balance are achieved/maintained  Outcome: Progressing  Flowsheets (Taken 07/22/2022 1136)  Fluid and electrolyte balance are achieved/maintained:   Monitor/assess lab values and report abnormal values   Assess and reassess fluid and electrolyte status   Observe for cardiac arrhythmias   Monitor for muscle weakness     Problem: Fluid and Electrolyte Imbalance/ Endocrine  Goal: Fluid and electrolyte balance are achieved/maintained  Outcome: Progressing  Flowsheets (Taken 07/22/2022 1136)  Fluid and electrolyte balance are achieved/maintained:   Monitor/assess lab values and report abnormal values   Assess and reassess fluid and electrolyte status   Observe for cardiac arrhythmias   Monitor for muscle weakness     Problem: Neurological Deficit  Goal: Neurological status is stable or improving  Outcome: Progressing  Flowsheets (Taken 07/22/2022 1136)  Neurological status is stable or improving:   Monitor/assess/document neurological assessment (Stroke: every 4 hours)   Monitor/assess NIH Stroke Scale   Observe for seizure activity and initiate seizure precautions if indicated   Re-assess NIH Stroke Scale for any change in status   Perform CAM Assessment     Problem: Every Day - Stroke  Goal: Neurological status is stable or  improving  Outcome: Progressing  Flowsheets (Taken 07/22/2022 1136)  Neurological status is stable or improving:   Monitor/assess/document neurological assessment (Stroke: every 4 hours)   Monitor/assess NIH Stroke Scale   Observe for seizure activity and initiate seizure precautions if indicated   Re-assess NIH Stroke Scale for any change in status   Perform CAM Assessment  Goal: Core/Quality measure requirements - Daily  Outcome: Progressing  Flowsheets (Taken 07/22/2022 1136)  Core/Quality measure requirements - Daily: Continue stroke education (must include Modifiable Risk Factors, Warning Signs and Symptoms of Stroke, Activation of Emergency Medical System and Follow-up Appointments). Ensure handout has been given and documented.  Goal: Patient will maintain adequate oxygenation  Outcome: Progressing  Flowsheets (Taken 07/22/2022 1136)  Patient will maintain adequate oxygenation:   Suction secretions as needed   Maintain SpO2 of greater than 92%

## 2022-07-22 NOTE — Progress Notes (Signed)
HD TX Completed x3.5hrs, 1.6L fluid removed, unable to removed desired goal due to pt occasional sinus brady on tele between 44-50, awake & responsive when name called, BP WDL, wound vac noted, had 1 watery BM, cleaned and changed, Report given to [facility].  Patient Vitals for the past 4 hrs:   BP Temp Pulse Resp   07/22/22 1345 140/63 98 F (36.7 C) (!) 59 (!) 43   07/22/22 1330 120/59 98 F (36.7 C) 71 21   07/22/22 1315 120/66 -- 72 --   07/22/22 1300 161/88 -- 64 --   07/22/22 1245 188/73 -- 66 --   07/22/22 1230 145/67 -- 64 --   07/22/22 1215 162/71 -- (!) 59 --   07/22/22 1200 162/70 -- (!) 53 --   07/22/22 1145 176/75 -- (!) 51 --   07/22/22 1130 146/63 -- (!) 51 --   07/22/22 1115 150/64 -- (!) 50 --   07/22/22 1100 131/62 -- (!) 51 --   07/22/22 1045 132/62 -- (!) 59 (!) 29   07/22/22 1030 110/57 -- (!) 53 --        07/22/22 1345   Treatment Summary   Time Off Machine 1330   Duration of Treatment (Hours) 3.5   Treatment Type 2:1   Dialyzer Clearance Moderately streaked   Fluid Volume Off (mL) 2000   Prime Volume (mL) 200   Rinseback Volume (mL) 200   Fluid Given: Normal Saline (mL) 0   Fluid Given: PRBC  0 mL   Fluid Given: Albumin (mL) 0   Fluid Given: Other (mL) 0   Total Fluid Given 400   Hemodialysis Net Fluid Removed 1600   Post Treatment Assessment   Post-Treatment Weight (Kg) -1.6   Patient Response to Treatment tolerated fair   Additional Dialyzer Used 0   Permacath Catheter - Tunneled 07/14/22 Subclavian vein catheter Right   Placement Date/Time: 07/14/22 1404   Inserted by: Dr. Welton Flakes  Access Type: Subclavian vein catheter  Orientation: Right  Central Line Infection Prevention Education provided?: Yes  Hand Hygiene: Alcohol based hand scrub;Soap and water  Line cart u...   Line necessity reviewed? Apheresis/hemodialysis   Site Assessment Clean;Dry;Intact   Catheter Lumen Volume Venous 1.6 mL   Catheter Lumen Volume Arterial 1.6 mL   Dressing Status and Intervention Dressing Intact;Clean  & Dry   Tego/Curos Caps on Catheter Yes   NEW Tego/Curos Caps placed (Date) 07/22/22   APHERESIS ONLY:  Access  Red   APHERESIS ONLY: Return Blue   End Caps Free From Blood Yes   Line Care Connections checked and tightened   Dressing Type Transparent;Biopatch   Dressing Status Clean;Dry;Intact   Dressing/Line Intervention Caps changed   Vitals   Temp 98 F (36.7 C)   Heart Rate (!) 59   Resp Rate (!) 43   BP 140/63   SpO2 99 %   O2 Device None (Room air)   Assessment   Mental Status Alert  (eyes open, responsive when name called)   Cardiac (WDL) X   Cardiac Regularity Irregular   Cardiac Symptoms Return to [facility]   Cardiac Rhythm Normal sinus rhythm;Sinus bradycardia   Respiratory  WDL   Respiratory Pattern Return to WDL   Bilateral Breath Sounds Diminished   R Breath Sounds Diminished   L Breath Sounds Diminished   Cough Spontaneous   Edema  WDL   Generalized Edema Non Pitting Edema   RLE Edema +1   LLE Edema +1  General Skin Color Appropriate for ethnicity   Skin Condition/Temp Return to WDL   Gastrointestinal (WDL) X   Abdomen Inspection   (wound vac)   GI Conditions Diarrhea   Mobility Bed   Pain Assessment   Charting Type Reassessment   Pain Scale Used CPOT   Numeric Pain Scale   Pain Score 0   POSS Score 1   Multiple Pain Sites No   Education   Person taught Patient   Knowledge basis Substantial   Topics taught Procedure   Teaching Tools Explain   Reponse Unable to Teach Back (see Comments)   Bedside Nurse Communication   Name of bedside RN - post dialysis Rogelia Rohrer RN

## 2022-07-22 NOTE — Progress Notes (Signed)
[facility]  PROGRESS NOTE      Patient: Shelby Barron  Date:07/08/2022   LOS: 24 Days  Admission Date: 06/28/2022   MRN: [id number]  Attending: Lennox Pippins, MD  When on service as the attending, please contact me on Epic Secure Chat from 7 AM - 7 PM for non-urgent issues. For urgent matters use XTend page from 7 AM - 7 PM.       ASSESSMENT/PLAN     Shelby Barron is a 82 y.o. female admitted with Bladder cancer    Interval Summary: [name], [name], type 2 diabetes, renal cell carcinoma status post partial bilateral nephrectomy, [name], new diagnosis of urothelial cancer initally presented on 6/10 for elective cystectomy and completion nephrectomy, [name], [name], extubated and off pressors since 6/13.     She is hypoactive and delirious. Surgery recommended NGT and start TF.  Has [name] T/Th/Sat. Nephrology following     Developed new onset afib RVR during HD. Stopped norvasc switched to metoprolol and gave additional IV metoprolol. Consulted cardiology. Discussed with ICU requested a consult given deterioration. Recommended repeat blood cultures and broadening antibiotics to meropenem. Palliative care consulted.    No further episodes of Afib RVR now on amiodarone gtt and PO metoprolol. Hgb stable. Repeating CXR, clear. Started robinul for upper airway secretions/rattling. Clinically improving.    Anticipated Discharge: Greater Than 48 Hours    Barriers to Discharge: Clinical improvement    Family/Social/Case Mgr Assistance: Patient might end up going to rehab  Patient Active Hospital Problem List:    #Toxic metabolic Encephalopathy/delirium  Patient was upgraded to ICU on 6/30 for lethargy and decreased responsiveness  CT head was repeated on 6/29, no acute finding  ABG consistent with respiratory alkalosis  EEG consistent with encephalopathy no seizure, MRI no acute  stroke  Culture pending  CT abdomen with and without contrast after premedication did not show any leak or abscess  Continue supportive care  - SLP advancing diet slowly. Switched to nocturnal tube feeds and will wean as able.  - Palliative consulted:  Family is clear they would like no limits to care and patient should remain full code.    #s/p open cystectomy, bilateral nephrectomies, and resection of small bowel after intra-op enterotomy    #Mixed shock - hemorrhagic vs septic: Resolved  #Leukocytosis  - 6/10 small bowel enteretomy with 1L EBL. Went to the OR on June 12 for small bowel anastomosis, closure of mesenteric defect, closure of abdominal wall with wound VAC application. Wound VAC changed 6/15  - Surgery consulted, recommend CTAP with PO contrast that showed no anastomotic leak. Low concern for abscesses as they'd expect these to have a more loculated appearance in the interim between 6/17 and 7/1 imaging  - ID consulted, completed meropenem and Mycamine for 2 weeks on 7/3    #Acute CVA on the right frontal lobe  #History of CVA  - Hold home plavix  - Left upper extremity not moving at all unclear duration  - CT head showed right frontal infarction  - Neurology consulted, started ASA 81mg  daily cleared by GI. Long term would likely benefit from anticoagulation in setting of new onset A fib but would favor waiting 10 days from onset due to size of infarcts and potential for hemorrhagic conversion which would be on 07/23/2022  -  SBP goal 120-160  - Carotid Doppler showed plaque with no critical stenosis  - Repeat head CT done on 6/29 for lethargy showed no acute finding  - EEG encephalopathic pattern no seizure, repeat MRI no new infarction  - PT OT recommend SNF    #Acute hypoxemic respiratory failure resolved  #COPD  Extubated on 6/13, currently on room air  Maintain SaO2 > 92%  Continue as needed neb treatment  X-ray repeated on 6/29 showed resolution of interstitial edema    #ESRD  S/p CRRT this  admission, now on HD TTS  Continue dialysis per nephrology  IR removed the nonfunctional permacath and right [place] IJ cath and replaced with a new permacath on 6/26.    #HTN  On metoprolol   PRN IV labetalol for SBP > 180     New onset Afib with RVR, improved  - CHA2DS2-VASc score is 7  - Status post amiodarone drip. Now stable on oral amiodarone and coreg per cardiology recommendations  - GI and general surgery cleared for anticoagulation. Neurology recommended to hold anticoagulation initiation for 10 days [till 07/23/2022] given the size of stroke      #Renal cell carcinoma  #Urothelial carcinoma  #Thrombocytopenia  - VTE ppx: SCDs, subcutaneous heparin     #Diabetes Mellitus type 2  -Sliding scale insulin          Nutrition: Tube feeds  Malnutrition Documentation    Moderate Malnutrition related to inadequate nutritional intake in the setting of acute illness as evidenced by <50% EER > 5 days, mild muscle losses (temporalis, deltoid) - new                 Patient has BMI=Body mass index is 36.45 kg/m.  Diagnosis: Obesity based on BMI criteria                                                          Recent Labs   Lab 07/22/22  0218 07/21/22  0220 07/20/22  0414   Hemoglobin 7.3* 7.8* 7.2*   Hematocrit 22.3* 23.9* 21.6*   MCV 91.8 91.6 90.4   WBC 12.23* 11.81* 12.59*   Platelet Count 401* 405* 403*     Recent Labs   Lab 07/18/22  0421 06/02/22  0514   Ferritin 2,300.21*  --    Iron 18* 62   TIBC 99* 145*   Iron Saturation 18 43       Code Status: Full Code    Dispo: pending improved mental status    Family Contact: Updated daughter    DVT Prophylaxis:   Current Facility-Administered Medications (Includes Only Anticoagulants, Misc. Hematological)   Medication Dose Route Last Admin    heparin (porcine) injection 5,000 Units  5,000 Units Subcutaneous 5,000 Units at 07/22/22 1610          CHART  REVIEW & DISCUSSION     The following chart items were reviewed as of 1:37 PM on 07/22/22:  [x]  Lab Results [x]  Imaging  Results   [x]  Problem List  [x]  Current Orders [x]  Current Medications  [x]  Allergies  []  Code Status []  Previous Notes   []  SDoH    The management and plan of care for this patient was discussed with the following specialty consultants:  [x]  Cardiology  [x]  Gastroenterology                 []   Infectious Disease  []  Pulmonology [x]  Neurology                [x]  Nephrology  []  Neurosurgery []  Orthopedic Surgery  [x]  ICU []  General Surgery []  Psychiatry                                   [x]  Palliative    SUBJECTIVE     Shelby Barron   Mental status improving - able to tell me she is in the hospital, otherwise unable to answer other orientation questions  Advanced diet to fulls    MEDICATIONS     Current Facility-Administered Medications   Medication Dose Route Frequency    acetaminophen  650 mg Oral 4 times per day    Or    acetaminophen  650 mg Oral 4 times per day    amiodarone  200 mg per NG tube Daily    aspirin  81 mg per NG tube Daily    carvedilol  12.5 mg Oral Q12H SCH    darbepoetin alfa  0.45 mcg/kg Subcutaneous Weekly    glycopyrrolate  0.2 mg Intravenous Q8H    heparin (porcine)  5,000 Units Subcutaneous Q12H SCH    insulin glargine  7 Units Subcutaneous QAM    insulin lispro  1-8 Units Subcutaneous Q6H    pantoprazole  40 mg per NG tube Q12H [facility]    [START ON 07/23/2022] thiamine  100 mg per NG tube Daily       PHYSICAL EXAM     Vitals:    07/22/22 1230   BP: 145/67   Pulse: 64   Resp:    Temp:    SpO2: 98%       Temperature: Temp  Min: 98.5 F (36.9 C)  Max: 99.4 F (37.4 C)  Pulse: Pulse  Min: 50  Max: 76  Respiratory: Resp  Min: 22  Max: 44  Non-Invasive BP: BP  Min: 110/57  Max: 181/72  Pulse Oximetry SpO2  Min: 97 %  Max: 100 %    Intake and Output Summary (Last 24 hours) at Date Time    Intake/Output Summary (Last 24 hours) at 07/22/2022 1337  Last data filed at 07/22/2022 1000  Gross per 24 hour   Intake 390 ml   Output 25 ml   Net 365 ml           GEN APPEARANCE: Normal; more alert, oriented x  1  HEENT: PERLA; EOMI; Conjunctiva Clear  NECK: Supple; No bruits  CVS: RRR, S1, S2; No M/G/R  LUNGS: CTAB; wheezing improved, no Rhonchi: No rales  ABD: Soft; No TTP; + Normoactive BS  EXT: No edema; Pulses 2+ and intact  NEURO: moving right upper extremity spontaneously, able to move lower extremities for pain but minimal, no movement at all on the left upper extremity  MENTAL STATUS: Lethargic, oriented x 1, able to follow simple commands    LABS     Recent Labs   Lab 07/22/22  0218 07/21/22  0220 07/20/22  0414   WBC 12.23* 11.81* 12.59*   RBC 2.43* 2.61* 2.39*   Hemoglobin 7.3* 7.8* 7.2*   Hematocrit 22.3* 23.9* 21.6*   MCV 91.8 91.6 90.4   Platelet Count 401* 405* 403*       Recent Labs   Lab 07/22/22  0219 07/21/22  0220 07/20/22  0414 07/19/22  0216 07/18/22  0421  Sodium 136 137 135 139 139   Potassium 3.9 3.4* 4.2 4.5 3.7   Chloride 101 101 100 102 101   CO2 21 24 22 23 25    BUN 43* 29* 57* 45* 31*   Creatinine 3.9* 2.9* 4.8* 3.9* 2.8*   Glucose 151* 135* 79 196* 185*   Calcium 8.8 8.4 9.0 9.0 8.9   Magnesium 1.8 1.8 2.0 1.9 1.8       Recent Labs   Lab 07/20/22  0414 07/18/22  0421 07/16/22  0244   ALT 9 12 11    AST (SGOT) 10 18 15    Bilirubin, Total 0.2 0.2 0.2   Albumin 1.6* 1.8* 1.8*   Alkaline Phosphatase 97 122* 129*                       Microbiology Results (last 15 days)       Procedure Component Value Units Date/Time    Culture, Blood, Aerobic And Anaerobic [[id number]] Collected: 07/18/22 1400    Order Status: Completed Specimen: Blood, Venous Updated: 07/21/22 2200     Culture Blood No growth at 3 days    Culture, Blood, Aerobic And Anaerobic [[id number]] Collected: 07/18/22 1400    Order Status: Completed Specimen: Blood, Venous Updated: 07/21/22 2200     Culture Blood No growth at 3 days             RADIOLOGY     CT Abdomen Pelvis W IV And PO Cont    Result Date: 07/19/2022   1.Slight interval increase in left-sided retroperitoneal fluid collection with the other abdominal fluid collections  appearing relatively stable. 2. There is no definite abscess or acute inflammatory process. 3. Incidental note is made of a small paraesophageal type hernia. 4. Sigmoid diverticulosis is noted without CT evidence of diverticulitis. 5. Other findings as noted above. Miguel Dibble, MD 07/19/2022 6:36 AM    MRI Brain WO Contrast    Result Date: 07/18/2022   Interval evolution of multifocal recent infarcts in the bilateral cerebral hemispheres. No new infarct. Milan Waunita Schooner, MD 07/18/2022 5:57 PM    XR Chest AP Portable    Result Date: 07/17/2022  1.Near-complete resolution of interstitial edema. 2.Improving pulmonary vascular congestion. Legrand Pitts, MD 07/17/2022 2:38 PM    CT Head WO Contrast    Result Date: 07/17/2022   1.No new acute intracranial pathology. 2.There are multiple regions of cytotoxic edema in the right > left frontal and left > right occipital lobes, consistent with known early subacute infarcts. Dannielle Burn 07/17/2022 8:26 AM    US Carotid Duplex Dopp Comp Bilateral    Result Date: 07/15/2022   1. Plaque formation in the right internal carotid artery bulb with elevated velocities. Stenosis is estimated at 50-69%. 2. Plaque in the left internal carotid artery with normal velocities. Any stenosis is less than 50%. Criteria for stenosis and velocity parameters are based on the guidelines of the SRU (Society of Radiologists in Ultrasound). Grant et al: Carotid Artery Stenosis: Grayscale and Doppler Ultrasound Diagnosis-- Society of Radiologists in Ultrasound Consensus Conference Ultrasound Quarterly. Volume 19, Number 4; December, 2003. Recommendations updated October 2021 Verne Carrow is an [facility] accredited facility Suszanne Finch, MD 07/15/2022 9:41 AM    Tunneled Cath Removal (Permcath)    Result Date: 07/14/2022  1. Nonfunctioning tunneled dialysis catheter as described above. 2. Successful placement of a new 19 cm Equistream catheter as described above. Dimitrios Papadouris, MD 07/14/2022 4:06 PM    Tunneled  Cath Placement (Permcath)    Result Date: 07/14/2022  1. Nonfunctioning tunneled dialysis catheter as described above. 2. Successful placement of a new 19 cm Equistream catheter as described above. Dimitrios Papadouris, MD 07/14/2022 4:06 PM    MRI Brain WO Contrast    Result Date: 07/13/2022   1.Patchy areas of acute/recent infarction involving the cerebral hemispheres bilaterally. The infarcts have a configuration suggesting watershed territory infarcts. 2.Chronic ischemic changes as discussed. Georgann Housekeeper, MD 07/13/2022 10:28 PM    CT Head WO Contrast    Result Date: 07/13/2022   1.New region of loss of gray-white matter differentiation in the right frontal lobe, concerning for acute infarction. 2.A few additional areas of poor gray-white matter differentiation for example in the left occipital lobe may be artifactual but could be correlated with MRI. 3.No evidence of acute intracranial hemorrhage or significant mass effect. These urgent results were discussed with and acknowledged by Dr. Don Broach on 07/13/2022 3:20 PM. Vara Guardian, MD 07/13/2022 3:21 PM    XR Chest AP Portable    Result Date: 07/10/2022  Interstitial markings have slightly increased, suggesting minimal edema. No other change. Wilmon Pali, MD 07/10/2022 4:49 PM    XR Chest AP Portable    Result Date: 07/07/2022   There is mild interstitial prominence suggesting mild congestive heart failure/fluid overload in the setting of shortness of breath [name], MD 07/07/2022 12:32 PM    XR Chest AP Portable    Result Date: 07/06/2022   Hypoventilation with basilar atelectasis. Charlott Rakes, MD 07/06/2022 3:35 PM    CT Abdomen Pelvis W IV And PO Cont    Result Date: 07/06/2022  1.Postoperative changes from bilateral nephrectomy and cystectomy. 2.Small amount of ascites including some areas which appear loculated. Small collections of fluid and gas in the anterior midline abdomen may be postoperative but superimposed infection is not excluded. No rim-enhancing fluid  collections or extravasation of oral contrast to suggest anastomotic leak. Demetrios Isaacs, MD 07/06/2022 5:51 AM    CT Head WO Contrast    Result Date: 07/06/2022   1.No acute intracranial abnormality is seen. Leandro Reasoner, MD 07/06/2022 3:56 AM    XR Abdomen AP    Result Date: 07/05/2022  Enteric tube terminates in the distal stomach. Elease Etienne, DO 07/05/2022 12:24 PM    XR Chest AP Portable    Result Date: 06/30/2022  1. Lines and tubes in adequate position. No pneumothorax. 2. Trace left pleural effusion and bibasilar atelectasis. Jasmine December D'Heureux, MD 06/30/2022 4:53 PM    XR Chest AP Portable    Result Date: 06/30/2022  Catheters as described. Pankaj Dominica, MD 06/30/2022 2:07 PM    XR Chest AP Portable    Result Date: 06/28/2022   Right mainstem bronchial intubation. The tube should be withdrawn about 4 cm. Dr. Tildon Husky is aware. Wynema Birch, MD 06/28/2022 4:56 PM    XR OR MISCOUNT    Result Date: 06/28/2022   No unexpected foreign body. These critical results were discussed with and acknowledged by Angie R., the circulating nurse, who will communicate the findings to Everardo All, MD on 06/28/2022 3:02 PM. Bosie Helper, MD 06/28/2022 3:03 PM   Echo Results       None          No results found for this or any previous visit.    Signed,  Lennox Pippins, MD  1:37 PM 07/22/2022

## 2022-07-22 NOTE — Progress Notes (Signed)
Infectious Diseases & Tropical Medicine  Progress Note    07/22/2022   Shelby Barron [id number] is a 82 y.o. female,       Assessment:     Encephalopathy  Acute CVA  History of bladder cancer and renal failure  Status post radical cystectomy/bilateral nephrectomy, enterectomy and lysis of adhesion (06/28/2022)  S/p small bowel resection  CT scan of abdomen and pelvis-no abscess or acute inflammatory process (07/19/2022)  Anemia  Leukocytosis likely reactive  End-stage renal disease on hemodialysis  Atrial fibrillation  Risk of aspiration    Plan:     Continue monitoring without antibiotics  Continue local wound care  Follow-up chest x-ray  Continue probiotics  Monitor electrolytes and renal functions closely  Monitor clinically    ROS:     General:  no fever, sleepy but arousable, receiving hemodialysis  HEENT: NG tube in place  Respiratory: No cough or shortness of breath  Gastrointestinal: Abdominal wall wound VAC in place  Genito-Urinary: no hematuria  Musculoskeletal: no edema  Neurological: Sleepy  Dermatological: No rash    Physical Examination:     Blood pressure 153/70, pulse 75, temperature 99.4 F (37.4 C), temperature source Axillary, resp. rate (!) 35, height 1.575 m (5\' 2" ), weight 90.4 kg (199 lb 4.7 oz), SpO2 98%.    General Appearance: Comfortable  HEENT: Pupils are equal, round, and reactive to light.   Lungs: Decreased breath sounds  Heart: Regular rate and rhythm  Chest: Symmetric chest wall expansion.   Abdomen: soft, no hepatosplenomegaly, no bowel sounds  Neurological: Sleepy  Extremities: No edema    Laboratory And Diagnostic Studies:     Recent Labs     07/22/22  0218 07/21/22  0220   WBC 12.23* 11.81*   Hemoglobin 7.3* 7.8*   Hematocrit 22.3* 23.9*   Platelet Count 401* 405*     Recent Labs     07/22/22  0219 07/21/22  0220   Sodium 136 137   Potassium 3.9 3.4*   Chloride 101 101   CO2 21 24   BUN 43* 29*   Creatinine 3.9* 2.9*   Glucose 151* 135*   Calcium 8.8 8.4      Recent Labs     07/20/22  0414   AST (SGOT) 10   ALT 9   Alkaline Phosphatase 97   Protein, Total 4.6*   Albumin 1.6*       Current Meds:      Scheduled Meds: PRN Meds:    acetaminophen, 650 mg, Oral, 4 times per day   Or  acetaminophen, 650 mg, Oral, 4 times per day  amiodarone, 200 mg, per NG tube, Daily  aspirin, 81 mg, per NG tube, Daily  carvedilol, 12.5 mg, Oral, Q12H SCH  darbepoetin alfa, 0.45 mcg/kg, Subcutaneous, Weekly  glycopyrrolate, 0.2 mg, Intravenous, Q8H  heparin (porcine), 5,000 Units, Subcutaneous, Q12H SCH  insulin glargine, 7 Units, Subcutaneous, QAM  insulin lispro, 1-8 Units, Subcutaneous, Q6H  pantoprazole, 40 mg, Intravenous, BID  thiamine, 100 mg, Intravenous, Q24H SCH        Continuous Infusions:     acetaminophen, 650 mg, Q6H PRN  albuterol-ipratropium, 3 mL, Q6H PRN  benzocaine, 1 spray, TID PRN  dextrose, 15 g of glucose, PRN   Or  dextrose, 12.5 g, PRN   Or  dextrose, 12.5 g, PRN   Or  glucagon (rDNA), 1 mg, PRN  diphenhydrAMINE, 25 mg, Q6H PRN  hydrALAZINE, 10 mg, Q3H PRN  labetalol, 10 mg, Q15 Min  PRN  lidocaine 2% jelly, , Once PRN  naloxone, 0.2 mg, PRN  oxyCODONE, 2.5 mg, Q4H PRN  phenol, 1 spray, Q2H PRN  sodium chloride, 100 mL, Q1H PRN  sodium chloride, 250 mL, PRN          Shelby Barron, M.D.  07/22/2022  8:58 AM

## 2022-07-23 LAB — CBC
Absolute nRBC: 0 10*3/uL (ref <=0.00)
Hematocrit: 23.3 % — ABNORMAL LOW (ref 34.7–43.7)
Hemoglobin: 7.6 g/dL — ABNORMAL LOW (ref 11.4–14.8)
MCH: 30 pg (ref 25.1–33.5)
MCHC: 32.6 g/dL (ref 31.5–35.8)
MCV: 92.1 fL (ref 78.0–96.0)
MPV: 9.8 fL (ref 8.9–12.5)
Platelet Count: 418 10*3/uL — ABNORMAL HIGH (ref 142–346)
RBC: 2.53 10*6/uL — ABNORMAL LOW (ref 3.90–5.10)
RDW: 15 % (ref 11–15)
WBC: 15.06 10*3/uL — ABNORMAL HIGH (ref 3.10–9.50)
nRBC %: 0 /100 WBC (ref <=0.0)

## 2022-07-23 LAB — WHOLE BLOOD GLUCOSE POCT
Whole Blood Glucose POCT: 162 mg/dL — ABNORMAL HIGH (ref 70–100)
Whole Blood Glucose POCT: 111 mg/dL — ABNORMAL HIGH (ref 70–100)
Whole Blood Glucose POCT: 119 mg/dL — ABNORMAL HIGH (ref 70–100)
Whole Blood Glucose POCT: 95 mg/dL (ref 70–100)

## 2022-07-23 LAB — BASIC METABOLIC PANEL
Anion Gap: 12 (ref 5.0–15.0)
BUN: 25 mg/dL — ABNORMAL HIGH (ref 7–21)
CO2: 24 mEq/L (ref 17–29)
Calcium: 8.8 mg/dL (ref 7.9–10.2)
Chloride: 103 mEq/L (ref 99–111)
Creatinine: 2.7 mg/dL — ABNORMAL HIGH (ref 0.4–1.0)
GFR: 16.8 mL/min/{1.73_m2} — ABNORMAL LOW (ref >=60.0)
Glucose: 141 mg/dL — ABNORMAL HIGH (ref 70–100)
Potassium: 3.9 mEq/L (ref 3.5–5.3)
Sodium: 139 mEq/L (ref 135–145)

## 2022-07-23 LAB — MAGNESIUM: Magnesium: 1.8 mg/dL (ref 1.6–2.6)

## 2022-07-23 LAB — CULTURE BLOOD AEROBIC AND ANAEROBIC
Culture Blood: NO GROWTH
Culture Blood: NO GROWTH

## 2022-07-23 LAB — PHOSPHORUS: Phosphorus: 2.6 mg/dL (ref 2.3–4.7)

## 2022-07-23 MED ORDER — APIXABAN 5 MG PO TABS
5.0000 mg | ORAL_TABLET | Freq: Two times a day (BID) | ORAL | Status: DC
Start: 2022-07-23 — End: 2022-07-23

## 2022-07-23 MED ORDER — HYDROMORPHONE HCL 1 MG/ML IJ SOLN
0.4000 mg | Freq: Once | INTRAMUSCULAR | Status: AC | PRN
Start: 2022-07-23 — End: 2022-07-26
  Administered 2022-07-26: 0.4 mg via INTRAVENOUS
  Filled 2022-07-23: qty 1

## 2022-07-23 MED ORDER — APIXABAN 2.5 MG PO TABS
2.5000 mg | ORAL_TABLET | Freq: Two times a day (BID) | ORAL | Status: DC
Start: 2022-07-23 — End: 2022-07-26
  Administered 2022-07-23 – 2022-07-25 (×5): 2.5 mg via ORAL
  Filled 2022-07-23 (×5): qty 1

## 2022-07-23 NOTE — Progress Notes (Signed)
Infectious Diseases & Tropical Medicine  Progress Note    07/23/2022   Shelby Barron [id number] is a 82 y.o. female,       Assessment:     Encephalopathy  Acute CVA  History of bladder cancer and renal failure  Status post radical cystectomy/bilateral nephrectomy, enterectomy and lysis of adhesion (06/28/2022)  S/p small bowel resection  CT scan of abdomen and pelvis-no abscess or acute inflammatory process (07/19/2022)  Anemia  Mild leukocytosis-likely reactive  End-stage renal disease on hemodialysis  Atrial fibrillation  Risk of aspiration    Plan:     Continue monitoring without antibiotics  Monitor leukocyte counts closely  Wound care nurse following   Follow-up chest x-ray  Continue probiotics  Monitor electrolytes and renal functions closely  Monitor clinically  Discussed with patient's daughter    ROS:     General:  no fever, awake and alert, daughter at bedside  HEENT: NG tube in place  Respiratory: No cough or shortness of breath  Gastrointestinal: Abdominal wall wound/wound VAC in place  Genito-Urinary: no hematuria  Musculoskeletal: no edema  Neurological: awake  Dermatological: No rash    Physical Examination:     Blood pressure 156/67, pulse 79, temperature 98.2 F (36.8 C), temperature source Oral, resp. rate (!) 30, height 1.575 m (5\' 2" ), weight 90.4 kg (199 lb 4.7 oz), SpO2 98%.    General Appearance: Appears comfortable  HEENT: Pupils are equal, round, and reactive to light.   Lungs: Decreased breath sounds  Heart: Regular rate and rhythm  Chest: Symmetric chest wall expansion.   Abdomen: soft, no hepatosplenomegaly, no bowel sounds, abdominal wall wound (pictures reviewed)  Neurological: awake  Extremities: No edema    Laboratory And Diagnostic Studies:     Recent Labs     07/23/22  0242 07/22/22  0218   WBC 15.06* 12.23*   Hemoglobin 7.6* 7.3*   Hematocrit 23.3* 22.3*   Platelet Count 418* 401*     Recent Labs     07/23/22  0242 07/22/22  0219   Sodium 139 136   Potassium 3.9  3.9   Chloride 103 101   CO2 24 21   BUN 25* 43*   Creatinine 2.7* 3.9*   Glucose 141* 151*   Calcium 8.8 8.8     No results for input(s): "AST", "ALT", "ALKPHOS", "PROT", "ALB" in the last 72 hours.      Current Meds:      Scheduled Meds: PRN Meds:    acetaminophen, 650 mg, Oral, 4 times per day   Or  acetaminophen, 650 mg, Oral, 4 times per day  amiodarone, 200 mg, per NG tube, Daily  aspirin, 81 mg, per NG tube, Daily  carvedilol, 12.5 mg, Oral, Q12H SCH  darbepoetin alfa, 0.45 mcg/kg, Subcutaneous, Weekly  glycopyrrolate, 0.2 mg, Intravenous, Q8H  heparin (porcine), 5,000 Units, Subcutaneous, Q12H SCH  insulin glargine, 7 Units, Subcutaneous, QAM  insulin lispro, 1-8 Units, Subcutaneous, Q6H  pantoprazole, 40 mg, per NG tube, Q12H SCH  thiamine, 100 mg, per NG tube, Daily        Continuous Infusions:     acetaminophen, 650 mg, Q6H PRN  albuterol-ipratropium, 3 mL, Q6H PRN  benzocaine, 1 spray, TID PRN  dextrose, 15 g of glucose, PRN   Or  dextrose, 12.5 g, PRN   Or  dextrose, 12.5 g, PRN   Or  glucagon (rDNA), 1 mg, PRN  diphenhydrAMINE, 25 mg, Q6H PRN  hydrALAZINE, 10 mg, Q3H PRN  labetalol, 10  mg, Q15 Min PRN  lidocaine 2% jelly, , Once PRN  naloxone, 0.2 mg, PRN  oxyCODONE, 2.5 mg, Q4H PRN  phenol, 1 spray, Q2H PRN          Shelby Barron, M.D.  07/23/2022  10:21 AM

## 2022-07-23 NOTE — Plan of Care (Addendum)
Problem: Everyday - Heart Failure  Goal: Mobility/Activity is Maintained at Optimal Level for Patient  Outcome: Progressing  Flowsheets (Taken 07/23/2022 0201)  Mobility/Activity is Maintained at Optimal Level for Patient:   Increase mobility as tolerated/progressive mobility protocol   Maintain SCD's as Ordered   Perform active/passive ROM   Reposition patient every 2 hours and as needed unless able to reposition self   Assess for changes in respiratory status, level of consciousness and/or development of fatigue   Consult with Physical Therapy and/or Occupational Therapy     Problem: Everyday - Heart Failure  Goal: Nutritional Intake is Adequate  Outcome: Progressing  Flowsheets (Taken 07/23/2022 0201)  Nutritional Intake is Adequate:   Cardiac diet-2 gm Sodium   Fluid Restricction if needed   Consult/Collaborate with Nutritionist   Patient and family teaching on low sodium diet   Assess appetite,anorexia and amount of meal/food tolerated   Encourage/perform oral hygiene as appropriate

## 2022-07-23 NOTE — Progress Notes (Signed)
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 s/p bilateral radical nephrectomies and  cystectomy on 06/28/22  - complicated by small bowel enterotomy and blood loss   New onset afib - on amiodarone  High grade urothelial bladder CA: s/p bilateral nephrectomies and cystectomy c/b mall bowel enterotomy and blood loss (~1L) on 06/28/22  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 - remains low, adequate iron - on aranesp  Encephalopathy - better   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   Hypophosphatemia - better    Recommendations:   S/p HD yesterday with 1.6Lnet removed; unable to remove 2L due to occasional bradycardia   HD tomorrow per routine HD    Case discussed with: HD RN  Delice Bison, MD  [facility]  [phone] (office)  [phone] (FFX Spectra-Link)      Subjective: asleep      Review of Systems:   Resting     Physical Exam:     Vitals:    07/23/22 0600 07/23/22 0700 07/23/22 0800 07/23/22 0814   BP: 149/67   171/75   Pulse: 71 77  80   Resp: (!) 23 (!) 25     Temp:   98.2 F (36.8 C)    TempSrc:   Oral    SpO2: 98% 98%     Weight:       Height:           Intake and Output Summary (Last 24 hours) at Date Time    Intake/Output Summary (Last 24 hours) at 07/23/2022 0850  Last data filed at 07/23/2022 0600  Gross per 24 hour   Intake 1000 ml   Output 1650 ml   Net -650 ml         General: asleep  Cardiovascular: regular rate and rhythm  Lungs: bilateral air entry  Abdomen:  soft  Extremities: LUE edema  Access: R permacath     Meds:      Scheduled Meds: PRN Meds:    acetaminophen, 650 mg, Oral, 4 times per day   Or  acetaminophen, 650 mg, Oral, 4 times per day  amiodarone, 200 mg, per NG tube, Daily  aspirin, 81 mg, per NG tube, Daily  carvedilol, 12.5 mg, Oral, Q12H SCH  darbepoetin alfa, 0.45 mcg/kg, Subcutaneous, Weekly  glycopyrrolate, 0.2 mg, Intravenous, Q8H  heparin (porcine), 5,000 Units, Subcutaneous, Q12H SCH  insulin glargine, 7 Units, Subcutaneous, QAM  insulin lispro, 1-8 Units, Subcutaneous, Q6H  pantoprazole, 40 mg, per NG tube, Q12H SCH  thiamine, 100 mg, per NG tube, Daily          Continuous Infusions:       acetaminophen, 650 mg, Q6H PRN  albuterol-ipratropium, 3 mL, Q6H PRN  benzocaine, 1 spray, TID PRN  dextrose, 15 g of glucose, PRN   Or  dextrose, 12.5 g, PRN   Or  dextrose, 12.5 g, PRN   Or  glucagon (rDNA), 1 mg, PRN  diphenhydrAMINE, 25 mg, Q6H PRN  hydrALAZINE, 10 mg, Q3H PRN  labetalol, 10 mg, Q15 Min PRN  lidocaine 2% jelly, , Once PRN  naloxone, 0.2 mg, PRN  oxyCODONE, 2.5 mg, Q4H PRN  phenol, 1 spray, Q2H PRN              Labs:     Recent Labs   Lab 07/23/22  0242 07/22/22  0218 07/21/22  0220   WBC 15.06* 12.23* 11.81*   Hemoglobin 7.6* 7.3* 7.8*   Hematocrit 23.3* 22.3* 23.9*   Platelet Count 418* 401* 405*     Recent Labs   Lab 07/23/22  0242 07/22/22  0219 07/21/22  0220 07/20/22  0414 07/19/22  0216 07/18/22  0421   Sodium 139 136 137 135  More results in Results Review 139   Potassium 3.9 3.9 3.4* 4.2  More results in Results Review 3.7   Chloride 103 101 101 100  More results in Results Review 101   CO2 24 21 24 22   More results in Results Review 25   BUN 25* 43* 29* 57*  More results in Results Review 31*   Creatinine 2.7* 3.9* 2.9* 4.8*  More results in Results Review 2.8*   Calcium 8.8 8.8 8.4 9.0  More results in Results Review 8.9   Albumin  --   --   --  1.6*  --  1.8*   Phosphorus 2.6 3.5 2.9 4.2  More results in Results Review 2.4    Magnesium 1.8 1.8 1.8 2.0  More results in Results Review 1.8   Glucose 141* 151* 135* 79  More results in Results Review 185*   GFR 16.8* 10.8* 15.4* 8.4*  More results in Results Review 16.1*   More results in Results Review = values in this interval not displayed.           Signed by: Delice Bison, MD, FASN

## 2022-07-23 NOTE — Progress Notes (Signed)
LOS # 25      Summary of Discharge Plan: SNF with HD capability/transportation options      Identified Possible Discharge Barriers: Patient still has feeding tube, working with SLP to advance diet. Patient has wound vac.  Patient will need insurance authorization for SNF.      CM Interventions and Outcome: CM spoke with the patient's daughter [name] at bedside. She is planning to tour the SNF options prior to making a decision. She has not been able to do so yet due to patient's decline, but plans to try to tour tomorrow.      Discussed above Discharge Plan with (patient, family, Care Team, others): care team, daughter [name]      Case Management will continue to follow for [place] needs that may arise.      Marlan Palau, MSW, ACM-SW  Social Worker Case Manager II  [facility]

## 2022-07-23 NOTE — Plan of Care (Signed)
Problem: Compromised Sensory Perception  Goal: Sensory Perception Interventions  Outcome: Progressing  Flowsheets (Taken 07/23/2022 0800)  Sensory Perception Interventions: Offload heels, Pad bony prominences, Reposition q 2hrs/turn Clock, Q2 hour skin assessment under devices if present     Problem: Inadequate Airway Clearance  Goal: Patent Airway maintained  Outcome: Progressing  Flowsheets (Taken 07/21/2022 1947 by Natale Lay, RN)  Patent airway maintained:   Provide adequate fluid intake to liquefy secretions   Reposition patient every 2 hours and as needed unless able to self-reposition   Suction secretions as needed     Problem: Fluid and Electrolyte Imbalance/ Endocrine  Goal: Fluid and electrolyte balance are achieved/maintained  Outcome: Progressing  Flowsheets (Taken 07/22/2022 1136 by Rogelia Rohrer, RN)  Fluid and electrolyte balance are achieved/maintained:   Monitor/assess lab values and report abnormal values   Assess and reassess fluid and electrolyte status   Observe for cardiac arrhythmias   Monitor for muscle weakness     Problem: Inadequate Gas Exchange  Goal: Patent Airway maintained  Outcome: Progressing  Flowsheets (Taken 07/21/2022 1947 by Natale Lay, RN)  Patent airway maintained:   Provide adequate fluid intake to liquefy secretions   Reposition patient every 2 hours and as needed unless able to self-reposition   Suction secretions as needed     Problem: Every Day - Stroke  Goal: Neurological status is stable or improving  Outcome: Progressing  Flowsheets (Taken 07/22/2022 2019 by Mar Daring, RN)  Neurological status is stable or improving:   Monitor/assess/document neurological assessment (Stroke: every 4 hours)   Monitor/assess NIH Stroke Scale   Re-assess NIH Stroke Scale for any change in status   Observe for seizure activity and initiate seizure precautions if indicated   Perform CAM Assessment  Goal: Stable vital signs and fluid balance  Outcome: Progressing  Flowsheets  (Taken 07/21/2022 1947 by Natale Lay, RN)  Stable vital signs and fluid balance:   Position patient for maximum circulation/cardiac output   Monitor intake and output. Notify LIP if urine output is < 30 mL/hour.   Encourage oral fluid intake   Monitor and assess vitals every 4 hours or as ordered and hemodynamic parameters   Apply telemetry monitor as ordered  Goal: Neurovascular status is stable or improving  Outcome: Progressing  Flowsheets (Taken 07/21/2022 1947 by Natale Lay, RN)  Neurovascular status is stable or improving:   Monitor/assess neurovascular status (pulses, capillary refill, pain, paresthesia, presence of edema)   Monitor/assess for signs of Venous Thrombus Emboli (edema of calf/thigh redness, pain)   Monitor/assess site of invasive procedure for signs of bleeding     NURSING SHIFT NOTE     Patient: Shelby Barron  Day: 25      SHIFT EVENTS     Shift Narrative/Significant Events (PRN med administration, fall, RRT, etc.):     Pt A&O to self and intermittently place. Following commands, tolerating diet. VSS, RA, family visiting and updated. C/O abdominal pain, PRN pain medications given w/ good effect. BM this shift.     Safety and fall precautions remain in place. Purposeful rounding completed.          ASSESSMENT     Changes in assessment from patient's baseline this shift:    Neuro: No  CV: No  Pulm: No  Peripheral Vascular: No  HEENT: No  GI: No  BM during shift: Yes   , Last BM: Last BM Date: 07/23/22  GU: No   Integ: No  MS: No  Pain: None  Pain Interventions: N/A  Medications Utilized: N/A    Mobility: PMP Activity: Step 3 - Bed Mobility of Distance Walked (ft) (Step 6,7): 0 Feet           Lines     Patient Lines/Drains/Airways Status       Active Lines, Drains and Airways       Name Placement date Placement time Site Days    Permacath Catheter - Tunneled 07/14/22 Subclavian vein catheter Right 07/14/22  1404  -- 9    Midline IV 07/01/22 Anterior;Left Upper Arm 07/01/22  1315  Upper  Arm  22    Feeding Tube ND 12 Fr. Right nare 07/05/22  1130  Right nare  18                         VITAL SIGNS     Vitals:    07/23/22 1700   BP: 184/80   Pulse: 67   Resp: 19   Temp:    SpO2: 99%       Temp  Min: 98.2 F (36.8 C)  Max: 99.8 F (37.7 C)  Pulse  Min: 58  Max: 90  Resp  Min: 17  Max: 30  BP  Min: 134/63  Max: 188/82  SpO2  Min: 94 %  Max: 100 %      Intake/Output Summary (Last 24 hours) at 07/23/2022 1849  Last data filed at 07/23/2022 1200  Gross per 24 hour   Intake 1140 ml   Output 50 ml   Net 1090 ml

## 2022-07-23 NOTE — Progress Notes (Signed)
[facility]  PROGRESS NOTE      Patient: Shelby Barron  Date:07/08/2022   LOS: 25 Days  Admission Date: 06/28/2022   MRN: [id number]  Attending: Lennox Pippins, MD  When on service as the attending, please contact me on Epic Secure Chat from 7 AM - 7 PM for non-urgent issues. For urgent matters use XTend page from 7 AM - 7 PM.       ASSESSMENT/PLAN     Shelby Barron is a 82 y.o. female admitted with Bladder cancer    Interval Summary: [name], [name], type 2 diabetes, renal cell carcinoma status post partial bilateral nephrectomy, [name], new diagnosis of urothelial cancer initally presented on 6/10 for elective cystectomy and completion nephrectomy, [name], [name], extubated and off pressors since 6/13.     She is hypoactive and delirious. Surgery recommended NGT and start TF.  Has [name] T/Th/Sat. Nephrology following     Developed new onset afib RVR during HD. Stopped norvasc switched to metoprolol and gave additional IV metoprolol. Consulted cardiology. Discussed with ICU requested a consult given deterioration. Recommended repeat blood cultures and broadening antibiotics to meropenem. Palliative care consulted.    No further episodes of Afib RVR now on amiodarone gtt and PO metoprolol. Hgb stable. Repeating CXR, clear.     Advancing diet and weaning tube feeds. Stopped aspirin and started eliquis on 7/5    Anticipated Discharge: Greater Than 48 Hours    Barriers to Discharge: Clinical improvement    Family/Social/Case Mgr Assistance: SNF pending weaning off Tfs & NGT removal  Patient Active Hospital Problem List:    #Toxic metabolic Encephalopathy/delirium  Patient was upgraded to ICU on 6/30 for lethargy and decreased responsiveness  CT head was repeated on 6/29, no acute finding  ABG consistent with respiratory alkalosis  EEG consistent with encephalopathy no  seizure, MRI no acute stroke  Culture pending  CT abdomen with and without contrast after premedication did not show any leak or abscess  Continue supportive care  - SLP advancing diet slowly. Switched to nocturnal tube feeds and will wean as able.  - Palliative consulted:  Family is clear they would like no limits to care and patient should remain full code.    #s/p open cystectomy, bilateral nephrectomies, and resection of small bowel after intra-op enterotomy    #Mixed shock - hemorrhagic vs septic: Resolved  #Leukocytosis  - 6/10 small bowel enteretomy with 1L EBL. Went to the OR on June 12 for small bowel anastomosis, closure of mesenteric defect, closure of abdominal wall with wound VAC application. Wound VAC last changed on 7/5  - Surgery consulted, recommend CTAP with PO contrast that showed no anastomotic leak. Low concern for abscesses as they'd expect these to have a more loculated appearance in the interim between 6/17 and 7/1 imaging  - ID consulted, completed meropenem and Mycamine for 2 weeks on 7/3    #Acute CVA on the right frontal lobe  #History of CVA  - Hold home plavix  - Left upper extremity not moving at all unclear duration  - CT head showed right frontal infarction  - Neurology following initially started on ASA 81mg  only due to concern for risk of hemorrhagic conversion. Now > 10 days out. OK to switch to Eliquis for Afib and discontinue aspirin per neurology on 7/5  -  SBP goal 120-160  - Carotid Doppler showed plaque with no critical stenosis  - Repeat head CT done on 6/29 for lethargy showed no acute finding  - EEG encephalopathic pattern no seizure, repeat MRI no new infarction  - PT OT recommend SNF    #Acute hypoxemic respiratory failure resolved  #COPD  Extubated on 6/13, currently on room air  Maintain SaO2 > 92%  Continue as needed neb treatment  X-ray repeated on 6/29 showed resolution of interstitial edema    #ESRD  S/p CRRT this admission, now on HD TTS  Continue dialysis per  nephrology  IR removed the nonfunctional permacath and right [place] IJ cath and replaced with a new permacath on 6/26.    #HTN  On metoprolol   PRN IV labetalol for SBP > 180     New onset Afib with RVR, improved  - CHA2DS2-VASc score is 7  - Status post amiodarone drip. Now stable on oral amiodarone and coreg per cardiology recommendations  - GI and general surgery cleared for anticoagulation. Neurology recommended to hold anticoagulation initiation for 10 days given the size of stroke. Now started on Eliquis since 07/23/22      #Renal cell carcinoma  #Urothelial carcinoma  #Thrombocytopenia  - VTE ppx: SCDs, subcutaneous heparin     #Diabetes Mellitus type 2  -Sliding scale insulin          Nutrition: Tube feeds  Malnutrition Documentation    Moderate Malnutrition related to inadequate nutritional intake in the setting of acute illness as evidenced by <50% EER > 5 days, mild muscle losses (temporalis, deltoid) - new                 Patient has BMI=Body mass index is 36.45 kg/m.  Diagnosis: Obesity based on BMI criteria                                                           Recent Labs   Lab 07/23/22  0242 07/22/22  0218 07/21/22  0220   Hemoglobin 7.6* 7.3* 7.8*   Hematocrit 23.3* 22.3* 23.9*   MCV 92.1 91.8 91.6   WBC 15.06* 12.23* 11.81*   Platelet Count 418* 401* 405*     Recent Labs   Lab 07/18/22  0421 06/02/22  0514   Ferritin 2,300.21*  --    Iron 18* 62   TIBC 99* 145*   Iron Saturation 18 43       Code Status: Full Code    Dispo: pending improved mental status    Family Contact: Updated daughter    DVT Prophylaxis:   Current Facility-Administered Medications (Includes Only Anticoagulants, Misc. Hematological)   Medication Dose Route Last Admin    heparin (porcine) injection 5,000 Units  5,000 Units Subcutaneous 5,000 Units at 07/23/22 0815          CHART  REVIEW & DISCUSSION     The following chart items were reviewed as of 10:12 AM on 07/23/22:  [x]  Lab Results [x]  Imaging Results   [x]  Problem  List  [x]  Current Orders [x]  Current Medications  [x]  Allergies  []  Code Status []  Previous Notes   []  SDoH    The management and plan of care for this patient was discussed with the following specialty consultants:  [x]  Cardiology  [  x] Gastroenterology                 []  Infectious Disease  []  Pulmonology [x]  Neurology                [x]  Nephrology  []  Neurosurgery []  Orthopedic Surgery  [x]  ICU []  General Surgery []  Psychiatry                                   [x]  Palliative    SUBJECTIVE     Shelby Barron   Alert, oriented x1  Daughter updated at bedside, she is helping encourage meals throughtout the day    MEDICATIONS     Current Facility-Administered Medications   Medication Dose Route Frequency    acetaminophen  650 mg Oral 4 times per day    Or    acetaminophen  650 mg Oral 4 times per day    amiodarone  200 mg per NG tube Daily    aspirin  81 mg per NG tube Daily    carvedilol  12.5 mg Oral Q12H [facility]    darbepoetin alfa  0.45 mcg/kg Subcutaneous Weekly    glycopyrrolate  0.2 mg Intravenous Q8H    heparin (porcine)  5,000 Units Subcutaneous Q12H [facility]    insulin glargine  7 Units Subcutaneous QAM    insulin lispro  1-8 Units Subcutaneous Q6H    pantoprazole  40 mg per NG tube Q12H [facility]    thiamine  100 mg per NG tube Daily       PHYSICAL EXAM     Vitals:    07/23/22 0900   BP: 156/67   Pulse: 79   Resp: (!) 30   Temp:    SpO2: 98%       Temperature: Temp  Min: 98 F (36.7 C)  Max: 99.8 F (37.7 C)  Pulse: Pulse  Min: 50  Max: 90  Respiratory: Resp  Min: 18  Max: 43  Non-Invasive BP: BP  Min: 110/57  Max: 197/80  Pulse Oximetry SpO2  Min: 94 %  Max: 100 %    Intake and Output Summary (Last 24 hours) at Date Time    Intake/Output Summary (Last 24 hours) at 07/23/2022 1012  Last data filed at 07/23/2022 0600  Gross per 24 hour   Intake 900 ml   Output 1650 ml   Net -750 ml           GEN APPEARANCE: Normal; more alert, oriented x 1  HEENT: PERLA; EOMI; Conjunctiva Clear  NECK: Supple; No bruits  CVS: RRR, S1, S2; No  M/G/R  LUNGS: CTAB; wheezing improved, no Rhonchi: No rales  ABD: Soft; No TTP; + Normoactive BS  EXT: No edema; Pulses 2+ and intact  NEURO: moving right upper extremity spontaneously, able to move lower extremities for pain but minimal, no movement at all on the left upper extremity  MENTAL STATUS: Lethargic, oriented x 1, able to follow simple commands    LABS     Recent Labs   Lab 07/23/22  0242 07/22/22  0218 07/21/22  0220   WBC 15.06* 12.23* 11.81*   RBC 2.53* 2.43* 2.61*   Hemoglobin 7.6* 7.3* 7.8*   Hematocrit 23.3* 22.3* 23.9*   MCV 92.1 91.8 91.6   Platelet Count 418* 401* 405*       Recent Labs   Lab 07/23/22  0242 07/22/22  0219 07/21/22  0220  07/20/22  0414 07/19/22  0216   Sodium 139 136 137 135 139   Potassium 3.9 3.9 3.4* 4.2 4.5   Chloride 103 101 101 100 102   CO2 24 21 24 22 23    BUN 25* 43* 29* 57* 45*   Creatinine 2.7* 3.9* 2.9* 4.8* 3.9*   Glucose 141* 151* 135* 79 196*   Calcium 8.8 8.8 8.4 9.0 9.0   Magnesium 1.8 1.8 1.8 2.0 1.9       Recent Labs   Lab 07/20/22  0414 07/18/22  0421   ALT 9 12   AST (SGOT) 10 18   Bilirubin, Total 0.2 0.2   Albumin 1.6* 1.8*   Alkaline Phosphatase 97 122*                       Microbiology Results (last 15 days)       Procedure Component Value Units Date/Time    Culture, Blood, Aerobic And Anaerobic [[id number]] Collected: 07/18/22 1400    Order Status: Completed Specimen: Blood, Venous Updated: 07/22/22 2200     Culture Blood No growth at 4 days    Culture, Blood, Aerobic And Anaerobic [[id number]] Collected: 07/18/22 1400    Order Status: Completed Specimen: Blood, Venous Updated: 07/22/22 2200     Culture Blood No growth at 4 days             RADIOLOGY     CT Abdomen Pelvis W IV And PO Cont    Result Date: 07/19/2022   1.Slight interval increase in left-sided retroperitoneal fluid collection with the other abdominal fluid collections appearing relatively stable. 2. There is no definite abscess or acute inflammatory process. 3. Incidental note is made of a  small paraesophageal type hernia. 4. Sigmoid diverticulosis is noted without CT evidence of diverticulitis. 5. Other findings as noted above. Miguel Dibble, MD 07/19/2022 6:36 AM    MRI Brain WO Contrast    Result Date: 07/18/2022   Interval evolution of multifocal recent infarcts in the bilateral cerebral hemispheres. No new infarct. Milan Waunita Schooner, MD 07/18/2022 5:57 PM    XR Chest AP Portable    Result Date: 07/17/2022  1.Near-complete resolution of interstitial edema. 2.Improving pulmonary vascular congestion. Legrand Pitts, MD 07/17/2022 2:38 PM    CT Head WO Contrast    Result Date: 07/17/2022   1.No new acute intracranial pathology. 2.There are multiple regions of cytotoxic edema in the right > left frontal and left > right occipital lobes, consistent with known early subacute infarcts. Dannielle Burn 07/17/2022 8:26 AM    US Carotid Duplex Dopp Comp Bilateral    Result Date: 07/15/2022   1. Plaque formation in the right internal carotid artery bulb with elevated velocities. Stenosis is estimated at 50-69%. 2. Plaque in the left internal carotid artery with normal velocities. Any stenosis is less than 50%. Criteria for stenosis and velocity parameters are based on the guidelines of the SRU (Society of Radiologists in Ultrasound). Grant et al: Carotid Artery Stenosis: Grayscale and Doppler Ultrasound Diagnosis-- Society of Radiologists in Ultrasound Consensus Conference Ultrasound Quarterly. Volume 19, Number 4; December, 2003. Recommendations updated October 2021 Verne Carrow is an [facility] accredited facility Suszanne Finch, MD 07/15/2022 9:41 AM    Tunneled Cath Removal (Permcath)    Result Date: 07/14/2022  1. Nonfunctioning tunneled dialysis catheter as described above. 2. Successful placement of a new 19 cm Equistream catheter as described above. Dimitrios Papadouris, MD 07/14/2022 4:06 PM    Tunneled  Cath Placement (Permcath)    Result Date: 07/14/2022  1. Nonfunctioning tunneled dialysis catheter as described above. 2.  Successful placement of a new 19 cm Equistream catheter as described above. Dimitrios Papadouris, MD 07/14/2022 4:06 PM    MRI Brain WO Contrast    Result Date: 07/13/2022   1.Patchy areas of acute/recent infarction involving the cerebral hemispheres bilaterally. The infarcts have a configuration suggesting watershed territory infarcts. 2.Chronic ischemic changes as discussed. Georgann Housekeeper, MD 07/13/2022 10:28 PM    CT Head WO Contrast    Result Date: 07/13/2022   1.New region of loss of gray-white matter differentiation in the right frontal lobe, concerning for acute infarction. 2.A few additional areas of poor gray-white matter differentiation for example in the left occipital lobe may be artifactual but could be correlated with MRI. 3.No evidence of acute intracranial hemorrhage or significant mass effect. These urgent results were discussed with and acknowledged by Dr. Don Broach on 07/13/2022 3:20 PM. Vara Guardian, MD 07/13/2022 3:21 PM    XR Chest AP Portable    Result Date: 07/10/2022  Interstitial markings have slightly increased, suggesting minimal edema. No other change. Wilmon Pali, MD 07/10/2022 4:49 PM    XR Chest AP Portable    Result Date: 07/07/2022   There is mild interstitial prominence suggesting mild congestive heart failure/fluid overload in the setting of shortness of breath [name], MD 07/07/2022 12:32 PM    XR Chest AP Portable    Result Date: 07/06/2022   Hypoventilation with basilar atelectasis. Charlott Rakes, MD 07/06/2022 3:35 PM    CT Abdomen Pelvis W IV And PO Cont    Result Date: 07/06/2022  1.Postoperative changes from bilateral nephrectomy and cystectomy. 2.Small amount of ascites including some areas which appear loculated. Small collections of fluid and gas in the anterior midline abdomen may be postoperative but superimposed infection is not excluded. No rim-enhancing fluid collections or extravasation of oral contrast to suggest anastomotic leak. Demetrios Isaacs, MD 07/06/2022 5:51 AM    CT Head  WO Contrast    Result Date: 07/06/2022   1.No acute intracranial abnormality is seen. Leandro Reasoner, MD 07/06/2022 3:56 AM    XR Abdomen AP    Result Date: 07/05/2022  Enteric tube terminates in the distal stomach. Elease Etienne, DO 07/05/2022 12:24 PM    XR Chest AP Portable    Result Date: 06/30/2022  1. Lines and tubes in adequate position. No pneumothorax. 2. Trace left pleural effusion and bibasilar atelectasis. Jasmine December D'Heureux, MD 06/30/2022 4:53 PM    XR Chest AP Portable    Result Date: 06/30/2022  Catheters as described. Pankaj Dominica, MD 06/30/2022 2:07 PM    XR Chest AP Portable    Result Date: 06/28/2022   Right mainstem bronchial intubation. The tube should be withdrawn about 4 cm. Dr. Tildon Husky is aware. Wynema Birch, MD 06/28/2022 4:56 PM    XR OR MISCOUNT    Result Date: 06/28/2022   No unexpected foreign body. These critical results were discussed with and acknowledged by Angie R., the circulating nurse, who will communicate the findings to Everardo All, MD on 06/28/2022 3:02 PM. Bosie Helper, MD 06/28/2022 3:03 PM   Echo Results       None          No results found for this or any previous visit.    Signed,  Lennox Pippins, MD  10:12 AM 07/23/2022

## 2022-07-23 NOTE — Treatment Plan (Deleted)
FOUR EYES SKIN ASSESSMENT NOTE    Shelby Barron  12-20-1940  [id number]    Braden Scale Score: 11    POC Initiated for Risk for Altered Skin Yes    Patient Assessed for Correct Mattress Surface Yes  *At risk patients with Braden Score less than 12 must be considered for specialty bed    Mepilex or Adhesive Foam Dressing applied to sacrum/heel if any PI risk factors present: Yes    If Wound/Pressure Injury present:    Wound/PI assessment documented in EHR: Yes    Admitting physician notified: Yes    Wound consult ordered: Yes    Loel Ro, RN  July 23, 2022  5:28 PM    Second RN/PCT Name:

## 2022-07-23 NOTE — Treatment Plan (Cosign Needed)
4 eyes in 4 hours pressure injury assessment note:      Completed with:   Unit & Time admitted:              Bony Prominences: Check appropriate box; if wound is present enter wound assessment in LDA     Occiput:                 [x]WNL  [] Wound present  Face:                     [x]WNL  [] Wound present  Ears:                      [x]WNL  [] Wound present  Spine:                    [x]WNL  [] Wound present  Shoulders:             [x]WNL  [] Wound present  Elbows:                  [x]WNL  [] Wound present  Sacrum/coccyx:     []WNL  [x] Wound present  Ischial Tuberosity:  [x]WNL  [] Wound present  Trochanter/Hip:      [x]WNL  [] Wound present  Knees:                   [x]WNL  [] Wound present  Ankles:                   [x]WNL  [] Wound present  Heels:                    [x]WNL  [] Wound present  Other pressure areas:  [] Wound location       Device related: [] Device name:         LDA completed if wound present: yes/no  Consult WOCN if necessary    Other skin related issues, ie tears, rash, etc, document in Integumentary flowsheet

## 2022-07-23 NOTE — Progress Notes (Signed)
Wound Ostomy Continence Consultation / Progress Note    Date Time: 07/23/22 2:39 PM  Patient Name: MARAN, SATO  Consulting Service: [facility] Day: 26     Reason for Consult / Follow Up   Vac change    Assessment & Plan   Assessment:   Pt assessed in her room resting in bed accompanied by daughter. She is awake and does verbalize. She answers some questions, but incorrectly (when asked about her sibling's names, she named her spouse and her daughter). She does somewhat follow commands. She endorses pain with vac change and often says "help me [name]" (her custodial aunt), which daughter says is new this admission (was given oxycodone). She is moving her right arm, did not notice her left arm move.    Wound Type and Location:  Surgical Wound Mid Abdomen  Dimensions: 25 cm x 7 cm x 6 cm (anterior abdominal wound ~7x7 cm)  Wound Bed:  Full thickness tissue loss with beefy red granulation tissue along wound edges, primarily proximal aspect of wound. Granulation tissue 50% Nongranulation tissue 20% Slough 30%. A few sutures are noted in the midline incision around the umbilicus   Undermining:no   Tunneling: no  Peri Wound/ Edges: Right lower abdominal skin continues to have necrotic wound bed. There is induration noted, and unsure if the anterior aspect of the wound is an evolution of tissue as the right lower portion of the abdominal incision continues to have nonviable tissue (also left side, but not as marked and no induration). There is seropurulent drainage from this area.   Drainage:  brownish serosanguinous  Pain:  yes gets prn med  Odor:  none     Daughter at bedside, Dr. Sedalia Muta at bedside spoke at length regarding pain medication.  Orders given for a dose of pain medication during wound vac changes.     Wound care to mid abdomen/ Vac Dressing Change     Area cleansed with Vashe wash (soaked for 5-10 minutes ) and  patted dry.    Applied  No-Sting skin barrier film to peri-wound and allowed to dry.    Placed Vac drape around peri-wound window paned style, to prevent maceration and applied Eakin's ring around umbilicus, top of wound and along the pannus and  curvature of pannus for more adherence.     Medical Adhesive Related Skin Injury  (MARSI) vs necrosis of other cause - Patient has partial thickness skin loss,  peri wound from vac drape between 6 and 9 O'clock peri wound.  Noted that peri wound area around wound is worsening.  Applied Plurogel directly to wound bed after cleansing and covered with silicone dressing.  This was topped with vac drape.      Packed 4 pieces of silver non adherent dressing and top with  two pieces of white foam and 2 pieces of of black foam into a wound  Secured with vac drape.   A quarter size cut hole was cut into the black foam to fit the trackpad.   Good seal obtained and wound vacuum setting at 125 mm Hg, low intensity continuous.  An aseptic technique utilized seemed to tolerate it well with pain medication.       Vashe Wound Solution helps to cleanse the wound and accomplish the goals of wound bed preparation in a biocompatible, safe, effective, and natural way. This solution of hypochlorous acid (HOCl) acts as a preservative that inhibits microbial contamination within the solution. Vashe Wound Solution has the  highest concentration of pure HOCl.     Vashe comes from supply rooms or central supply.         Plurogel- contains a non -cytotoxic surfactant that aids in maintaining a moist wound healing environment which promotes autolytic debridement. Indicated for use on a partial thickness and full thickness lightly to moderately draining wounds, 1st, and second-degree burn.      Plurogel is in the wound care office.      Moisture Associated Skin Damage- staff reported skin tear. Partial thickness skin loss to Left buttock.  Recommend use of Triad to site.  Partial thickness skin loss reported ( did not visualize) patient in great deal of pain after vac application and with  pain med. Recommend treatment per policy .  Wound care:  1. Clean wound with wound cleanser or incontinence wipes.  2. Apply a layer of Triad (thickness of a dime) directly on the wound. If Triad not sticking to wound bed, pat to dry and reapply.  3. Keep open to air.     Triad Hydrophilic Wound Dressing is a Zinc-oxide based hydrophilic paste for light-to- moderate levels of wound exudates. Helps maintain an optimal wound healing environment to facilitate natural autolytic debridement. Ideal alternative for difficult-to-dress areas and varying wound etiologies.     Initiate/ continue pressure prevention bundle:  Head of the bed 30 degrees or less  Positioning device to the bedside  Eliminate/minimize pressure from the area  Float heels with boots or pillows  Turn patient  Pressure redistribution cushion to the chair  Use lift sheets/low friction surface sheets for positioning  Pad bony prominences  Nutrition consult/Optimize nutrition  Initiate bed algorithm/Specialty bed  Moisture/Incontinence management - Cleanse with incontinence cleansing wipe/water to manage incontinence and to protect skin from exposure to urine and stool. Apply skin barrier protection cream. Apply [place]/external or female external urine management system to prevent urinary contamination of a wound. Apply rectal pouch/fecal management system per unit policy, to prevent fecal contamination of a wound or to contain diarrhea.      Wound Photography:     Upper abdominal wound      Lower aspect      Specialty Bed: Progressa mattress - combines Engineer, civil (consulting), incontinence management systems, and StayInPlace technology to address the five factors of skin breakdown - pressure, shear, friction, heat, and moisture - for optimal wound healing and skin protection.     History of Present Illness   This is a 82 y.o. female  has a past medical history of Acid reflux, Asthma, well controlled, Cancer of kidney, Chronic lower back pain,  Congestive heart disease, COPD (chronic obstructive pulmonary disease), CVA (cerebral infarction) (01/12/2003), Diabetes, Diabetic nephropathy, Fibroids, Gout, H/O: gout, Hyperlipidemia, Hypertension, Neuropathy of hand, and SOB (shortness of breath)..  Admitted with Bladder cancer.        Procedure(s) with comments:  TUNNELED CATH REMOVAL - right neck/chest tunneled HD catheter removal    25 Days Post-Op  -------------------  **Canceled**    Procedure(s) with comments:  TUNNELED CATH REMOVAL - right neck/chest tunneled HD catheter removal    * No surgery date entered *  -------------------    Procedure(s) with comments:  TUNNELED CATH REMOVAL - right neck/chest tunneled HD catheter removal    23 Days Post-Op  -------------------    Procedure(s) with comments:  TUNNELED CATH REMOVAL - right neck/chest tunneled HD catheter removal    9 Days Post-Op  -------------------    Procedure(s) with comments:  TUNNELED  CATH REMOVAL - right neck/chest tunneled HD catheter removal    9 Days Post-Op  -------------------        From Nursing/Other Documentation:   Bowel Incontinence: Yes (07/22/22 2000)  Last BM Date: 07/23/22 (07/23/22 0800)  Urinary Incontinence: No (07/23/22 0400)    Ht Readings from Last 1 Encounters:   07/18/22 1.575 m (5\' 2" )     Wt Readings from Last 3 Encounters:   07/23/22 90.4 kg (199 lb 4.7 oz)   06/25/22 87.1 kg (192 lb)   06/23/22 84.8 kg (187 lb)     Body mass index is 36.45 kg/m.    Recent Labs     07/23/22  0242   WBC 15.06*   RBC 2.53*   Hemoglobin 7.6*   Hematocrit 23.3*   Sodium 139   Potassium 3.9   Chloride 103   CO2 24   BUN 25*   Creatinine 2.7*   Calcium 8.8   GFR 16.8*   Glucose 141*     Lab Results   Component Value Date    HGBA1C 5.6 07/14/2022    HGBA1C 8.5 (H) 05/27/2022    GLU 141 (H) 07/23/2022    LDL 23 07/14/2022    CREAT 2.7 (H) 07/23/2022     Recent Labs   Lab 07/23/22  1152 07/23/22  0820 07/23/22  0510 07/22/22  2338 07/22/22  1758 07/22/22  0517 07/21/22  2331 07/21/22  1800  07/21/22  1144 07/21/22  0555   Whole Blood Glucose POCT 119* 111* 162* 114* 118* 136* 145* 139* 152* 156*     Fanny Skates, MSN, RN, [employer], CFCN  Inpatient Wound, Ostomy, and Continence Nurse Coordinator  [facility]  [phone]

## 2022-07-24 LAB — MAGNESIUM: Magnesium: 1.8 mg/dL (ref 1.6–2.6)

## 2022-07-24 LAB — BASIC METABOLIC PANEL
Anion Gap: 12 (ref 5.0–15.0)
BUN: 21 mg/dL (ref 7–21)
CO2: 26 mEq/L (ref 17–29)
Calcium: 8.4 mg/dL (ref 7.9–10.2)
Chloride: 102 mEq/L (ref 99–111)
Creatinine: 2 mg/dL — ABNORMAL HIGH (ref 0.4–1.0)
GFR: 24 mL/min/{1.73_m2} — ABNORMAL LOW (ref >=60.0)
Glucose: 99 mg/dL (ref 70–100)
Potassium: 3.7 mEq/L (ref 3.5–5.3)
Sodium: 140 mEq/L (ref 135–145)

## 2022-07-24 LAB — CBC
Absolute nRBC: 0 10*3/uL (ref <=0.00)
Hematocrit: 23 % — ABNORMAL LOW (ref 34.7–43.7)
Hemoglobin: 7.5 g/dL — ABNORMAL LOW (ref 11.4–14.8)
MCH: 29.9 pg (ref 25.1–33.5)
MCHC: 32.6 g/dL (ref 31.5–35.8)
MCV: 91.6 fL (ref 78.0–96.0)
MPV: 9.6 fL (ref 8.9–12.5)
Platelet Count: 444 10*3/uL — ABNORMAL HIGH (ref 142–346)
RBC: 2.51 10*6/uL — ABNORMAL LOW (ref 3.90–5.10)
RDW: 15 % (ref 11–15)
WBC: 15.15 10*3/uL — ABNORMAL HIGH (ref 3.10–9.50)
nRBC %: 0 /100 WBC (ref <=0.0)

## 2022-07-24 LAB — WHOLE BLOOD GLUCOSE POCT
Whole Blood Glucose POCT: 106 mg/dL — ABNORMAL HIGH (ref 70–100)
Whole Blood Glucose POCT: 158 mg/dL — ABNORMAL HIGH (ref 70–100)
Whole Blood Glucose POCT: 164 mg/dL — ABNORMAL HIGH (ref 70–100)
Whole Blood Glucose POCT: 87 mg/dL (ref 70–100)

## 2022-07-24 LAB — PHOSPHORUS: Phosphorus: 1.8 mg/dL — ABNORMAL LOW (ref 2.3–4.7)

## 2022-07-24 MED ORDER — SODIUM CHLORIDE 0.9 % IV BOLUS
100.0000 mL | INTRAVENOUS | Status: AC | PRN
Start: 2022-07-24 — End: 2022-07-24

## 2022-07-24 MED ORDER — SODIUM CHLORIDE 0.9 % IV BOLUS
250.0000 mL | INTRAVENOUS | Status: AC | PRN
Start: 2022-07-24 — End: 2022-07-24

## 2022-07-24 NOTE — SLP Progress Note (Signed)
[facility]  Speech Therapy Treatment Note    Patient:  Shelby Barron MRN#:  [id number]  Unit:  [place] CARDIOVASCULAR & NEURO INTENSIVE CARE Room/Bed:  A2716/A2716-01      Time of treatment:   SLP Received On: 07/24/22  Start Time: 1517  Stop Time: 1536  Time Calculation (min): 19 min       Personal Protective Equipment (PPE)  Gloves and Procedure Mask with Shield    Subjective: Pt asleep upon arrival and aroused with verbal and tactile cues. Minimally verbal. Family at bedside. RN cleared pt for treatment.         Objective: Oropharyngeal analysis during PO trials in order to determine LRD,development and training of swallow strategies in order to improve swallow function and decrease risk of penetration/aspiration;  therapeutic feedings by SLP  and safe swallowing precautions education.       Assessment: Pt drowsy following dialysis. Pt with secretions in oral cavity at baseline. Yankauer suction set up in room. Pt required oral suction prior to PO trials d/t poor secretion management. The pt required max verbal and visual cues to open oral cavity to accept PO trials visa spoon. Pt given 2oz of moderately thickened juice, and 1oz of mildly thickened juice. Pt with significant delay in swallow trigger and mild delay in bolus transit times with decreased HLE subjectively via palpation.  Pt with no overt /sx of aspiration although premature spillage and penetration is suspected d/t pt's delayed swallow trigger. Pt with no cough or throat clear reflex. When asked to cough pt would respond "yes".   Further PO trials were discontinued.         Plan/Recommendations:  full liquid   and Moderately thick MO3  Slow feeding rate, small bites / sips , upright position during and after the meals , full assistance with meals , liquid wash, and check oral cavity for residue         Charges Minutes   SLP treat    Swallow treat 19       07/24/2022  Claudie Revering., CCC-SLP  [id number] Language  Pathology

## 2022-07-24 NOTE — SLP Progress Note (Signed)
[facility]    Speech Therapy Treatment Note Attempt   Patient: SRA CASTLES MRN#: [id number]   Unit: [place] CARDIOVASCULAR & NEURO INTENSIVE CARE Room/Bed: A2716/A2716-01       Attempted to see patient at approximately 09:48 am.     SLP Visit Cancellation Reason: Testing/Procedure, Pt in dialysis.     Will attempt again as schedule permits.     07/24/2022   Claudie Revering., CCC-SLP  [id number] Language Pathology

## 2022-07-24 NOTE — Progress Notes (Signed)
HD TX Completed x3.5hrs, 2.1L fluid removed, tolerated well, alert & responsive, VS WDL, stable during tx Report given to [facility] RN.  Patient Vitals for the past 4 hrs:   BP Temp Pulse Resp   07/24/22 1335 146/63 98 F (36.7 C) 71 20   07/24/22 1334 111/56 97.8 F (36.6 C) 71 20   07/24/22 1330 111/56 -- 75 --   07/24/22 1315 112/59 -- 75 --   07/24/22 1300 118/60 -- 74 --   07/24/22 1245 116/57 -- 72 --   07/24/22 1230 110/58 -- 72 --   07/24/22 1215 117/56 -- 73 --   07/24/22 1200 114/54 -- 70 --   07/24/22 1145 -- -- 68 --   07/24/22 1130 126/56 -- 69 --   07/24/22 1115 144/67 -- 67 --   07/24/22 1100 139/62 -- 69 --   07/24/22 1045 159/70 -- 63 --   07/24/22 1030 155/71 -- 66 --   07/24/22 1015 171/75 -- 61 --   07/24/22 1000 199/78 -- 65 --   07/24/22 0959 169/74 98.2 F (36.8 C) 63 (!) 24        07/24/22 1335   Treatment Summary   Time Off Machine 1335   Duration of Treatment (Hours) 3.5   Treatment Type 2:1   Dialyzer Clearance Moderately streaked   Fluid Volume Off (mL) 2500   Prime Volume (mL) 200   Rinseback Volume (mL) 200   Fluid Given: Normal Saline (mL) 0   Fluid Given: PRBC  0 mL   Fluid Given: Albumin (mL) 0   Fluid Given: Other (mL) 0   Total Fluid Given 400   Hemodialysis Net Fluid Removed 2100   Post Treatment Assessment   Post-Treatment Weight (Kg) -2.1   Patient Response to Treatment tolerated well   Additional Dialyzer Used 0   Permacath Catheter - Tunneled 07/14/22 Subclavian vein catheter Right   Placement Date/Time: 07/14/22 1404   Inserted by: Dr. Welton Flakes  Access Type: Subclavian vein catheter  Orientation: Right  Central Line Infection Prevention Education provided?: Yes  Hand Hygiene: Alcohol based hand scrub;Soap and water  Line cart u...   Line necessity reviewed? Apheresis/hemodialysis   Site Assessment Clean;Dry;Intact   Catheter Lumen Volume Venous 1.6 mL   Catheter Lumen Volume Arterial 1.6 mL   Dressing Status and Intervention Dressing Intact;Clean & Dry   Tego/Curos Caps on  Catheter Yes   NEW Tego/Curos Caps placed (Date) 07/24/22   APHERESIS ONLY:  Access  Red   APHERESIS ONLY: Return Blue   End Caps Free From Blood Yes   Line Care Connections checked and tightened   Dressing Type Transparent;Biopatch   Dressing Status Clean;Dry;Intact   Dressing/Line Intervention Caps changed   Vitals   Temp 98 F (36.7 C)   Heart Rate 71   Resp Rate 20   BP 146/63   SpO2 99 %   O2 Device None (Room air)   Assessment   Mental Status Alert;Oriented;Cooperative   Cardiac (WDL) WDL   Cardiac Regularity Return to [facility]   Cardiac Symptoms Return to [facility]   Cardiac Rhythm Normal sinus rhythm   Respiratory  WDL   Respiratory Pattern Return to WDL   Bilateral Breath Sounds Diminished   R Breath Sounds Diminished   L Breath Sounds Diminished   Cough Spontaneous   Edema  WDL   Generalized Edema Non Pitting Edema   RLE Edema Non Pitting Edema   LLE Edema Non Pitting Edema   General Skin  Color Appropriate for ethnicity   Skin Condition/Temp Return to WDL   Gastrointestinal (WDL) X   Abdomen Inspection   (wound vac)   Mobility Bed   Pain Assessment   Charting Type Reassessment   Pain Scale Used Numeric Scale (0-10)   Numeric Pain Scale   Pain Score 0   POSS Score 1   Multiple Pain Sites No   Education   Person taught Patient   Knowledge basis Substantial   Topics taught Procedure   Teaching Tools Explain   Reponse Unable to Teach Back (see Comments)   Bedside Nurse Communication   Name of bedside RN - post dialysis Alene Peagler RN

## 2022-07-24 NOTE — Progress Notes (Signed)
HD started via R CVC, working well, pt awake & responsive, VS wdl, Report received from Alene RN   07/24/22 0959   Bedside Nurse Communication   Name of bedside RN - pre dialysis Alene Peagler RN   Treatment Initiation- With Dialysis Precautions   Time Out/Safety Check Completed Yes   Consent for HD signed for this hospitalization (Date) 06/01/22   Consent for HD signed for this hospitalization (Time) 1516   Preferred language No   Blood Consent Verified N/A   Dialysis Precautions All Connections Secured   Dialysis Treatment Type Routine   Is patient diabetic? Yes   RO/Hemodialysis Cabin crew   Is Total Chlorine less than 0.1 ppm? Yes   Orignial Total Chlorine Testing Time 0930   At 4 Hour Total Chlorine Testing Time 1330   RO/Hemodialysis Arts development officer Number 6    Machine Serial Number [id number]   RO # 27   RO Serial # [id number]   Water Hardness NA   pH 7.2   Pressure Test Verified Yes   Alarms Verified Passed   Machine Temperature 97.7 F (36.5 C)   Alarms Verified Yes   Na+ mEq (Machine) 138 mEq   Bicarb mEq (Machine) 35 mEq   Hemodialysis Conductivity (Machine) 13.6   Hemodialysis Conductivity (Meter) 13.8   Dialyzer Lot Number [id number]   Tubing Lot Number [id number]   RO Machine Log Completed Yes   Hepatitis Status   HBsAg (Antigen) Result Negative   HBsAg Date Drawn 07/03/22   HBsAg Repeat Draw Due Date 07/31/22   Dialysis Weight   Pre-Treatment Weight (Kg) 87.5   Scale Type ICU Bed Scale   Vitals   Temp 98.2 F (36.8 C)   Heart Rate 63   Resp Rate (!) 24   BP 169/74   SpO2 98 %   O2 Device None (Room air)   Assessment   Mental Status Alert;Cooperative   Cardiac (WDL) WDL   Cardiac Regularity Irregular   Cardiac Symptoms Return to [facility]   Cardiac Rhythm Normal sinus rhythm   Respiratory  WDL   Respiratory Pattern Return to WDL   Bilateral Breath Sounds Rhonchi   R Breath Sounds Diminished   L Breath Sounds Diminished   Cough Spontaneous   Edema  WDL   Generalized Edema Non Pitting  Edema   RLE Edema Non Pitting Edema   LLE Edema Non Pitting Edema   General Skin Color Appropriate for ethnicity   Skin Condition/Temp Return to [facility]   Gastrointestinal (WDL) X   Abdomen Inspection   (WOUND VAC)   Mobility Bed   Permacath Catheter - Tunneled 07/14/22 Subclavian vein catheter Right   Placement Date/Time: 07/14/22 1404   Inserted by: Dr. Welton Flakes  Access Type: Subclavian vein catheter  Orientation: Right  Central Line Infection Prevention Education provided?: Yes  Hand Hygiene: Alcohol based hand scrub;Soap and water  Line cart u...   Line necessity reviewed? Apheresis/hemodialysis   Site Assessment Clean;Dry;Intact   Catheter Lumen Volume Venous 1.6 mL   Catheter Lumen Volume Arterial 1.6 mL   Dressing Status and Intervention Dressing Intact;Clean & Dry   Tego/Curos Caps on Catheter Yes   NEW Tego/Curos Caps placed (Date) 07/24/22   APHERESIS ONLY:  Access  Red   APHERESIS ONLY: Return Blue   End Caps Free From Blood Yes   Line Care Connections checked and tightened   Dressing Type Transparent;Biopatch   Dressing Status Clean;Dry;Intact   Dressing/Line Intervention Caps  changed   Pain Assessment   Charting Type Assessment   Pain Scale Used Numeric Scale (0-10)   Numeric Pain Scale   Pain Score 0   POSS Score 1   Multiple Pain Sites No   Hemodialysis Comments   Pre-Hemodialysis Comments timeout & safety checks

## 2022-07-24 NOTE — Plan of Care (Signed)
Problem: Renal Instability  Goal: Fluid and electrolyte balance are achieved/maintained  Flowsheets (Taken 07/21/2022 1947 by Nesmith, Camryn, RN)  Fluid and electrolyte balance are achieved/maintained:   Monitor/assess lab values and report abnormal values   Assess and reassess fluid and electrolyte status   Observe for cardiac arrhythmias   Monitor for muscle weakness   Follow fluid restrictions/IV/PO parameters     Problem: Patient Receiving Advanced Renal Therapies  Goal: Therapy access site remains intact  Flowsheets (Taken 07/20/2022 2218 by Duenas, Lonna, RN)  Therapy access site remains intact: Assess therapy access site

## 2022-07-24 NOTE — Treatment Plan (Incomplete)
4 eyes in 4 hours pressure injury assessment note:      Completed with:   Unit & Time admitted:              Bony Prominences: Check appropriate box; if wound is present enter wound assessment in LDA     Occiput:                 [x] WNL  []  Wound present  Face:                     [x] WNL  []  Wound present  Ears:                      [x] WNL  []  Wound present  Spine:                    [x] WNL  []  Wound present  Shoulders:             [x] WNL  []  Wound present  Elbows:                  [x] WNL  []  Wound present  Sacrum/coccyx:     [x] WNL  []  Wound present  Ischial Tuberosity:  [x] WNL  []  Wound present  Trochanter/Hip:      [x] WNL  []  Wound present  Knees:                   [x] WNL  []  Wound present  Ankles:                   [x] WNL  []  Wound present  Heels:                    [x] WNL  []  Wound present  Other pressure areas:  [x]  Wound location---abdomen with wound VAC       Device related: []  Device name:         LDA completed if wound present: yes  Consult WOCN if necessary    Other skin related issues, ie tears, rash, etc, document in Integumentary flowsheet

## 2022-07-24 NOTE — Plan of Care (Addendum)
Patient opens eyes to voice, follows commands on RUE and  L-LE. Pt is A&OX1 to self only,sometimes forgetful with inappropriate speech. Tube feeing nepro running at 60cc/hr with 100 cc water flushes Q 4  hrs.  Patient is turned and repositioned Q 2hrs.  Patient's daughter called and  updated with POC.   Could not draw AM labs from midline.  Midline not drawing back blood. Tech notified. Pt is hard stick.  Labs will be drawn during HD today.     Will continue to monitor, Q 4 neuro checks, NIHSS Q shift, turn and reposition Q 2 hr,wound care.  Problem: Moderate/High Fall Risk Score >5  Goal: Patient will remain free of falls  Outcome: Progressing  Flowsheets (Taken 07/23/2022 2100)  Moderate Risk (6-13):   MOD-Request PT/OT consult order for patients with gait/mobility impairment   MOD-Perform dangle, stand, walk (DSW) prior to mobilization   MOD-Remain with patient during toileting   MOD-Floor mat at bedside (where available) if appropriate   MOD-Apply bed exit alarm if patient is confused   LOW-Fall Interventions Appropriate for Low Fall Risk   MOD-include family in multidisciplinary POC discussions     Problem: Compromised Sensory Perception  Goal: Sensory Perception Interventions  Outcome: Progressing  Flowsheets (Taken 07/23/2022 2000)  Sensory Perception Interventions: Offload heels, Pad bony prominences, Reposition q 2hrs/turn Clock, Q2 hour skin assessment under devices if present     Problem: Compromised Moisture  Goal: Moisture level Interventions  Outcome: Progressing  Flowsheets (Taken 07/23/2022 2000)  Moisture level Interventions: Moisture wicking products, Moisture barrier cream

## 2022-07-24 NOTE — Progress Notes (Signed)
[facility]  PROGRESS NOTE      Patient: Shelby Barron  Date:07/08/2022   LOS: 26 Days  Admission Date: 06/28/2022   MRN: [id number]  Attending: Gwyndolyn Kaufman, MD  When on service as the attending, please contact me on Epic Secure Chat from 7 AM - 7 PM for non-urgent issues. For urgent matters use XTend page from 7 AM - 7 PM.       ASSESSMENT/PLAN     Shelby Barron is a 82 y.o. female admitted with Bladder cancer    Interval Summary: [name], [name], type 2 diabetes, renal cell carcinoma status post partial bilateral nephrectomy, [name], new diagnosis of urothelial cancer initally presented on 6/10 for elective cystectomy and completion nephrectomy, [name], [name], extubated and off pressors since 6/13.     She is hypoactive and delirious. Surgery recommended NGT and start TF.  Has [name] T/Th/Sat. Nephrology following     Developed new onset afib RVR during HD. Stopped norvasc switched to metoprolol and gave additional IV metoprolol. Consulted cardiology. Discussed with ICU requested a consult given deterioration. Recommended repeat blood cultures and broadening antibiotics to meropenem. Palliative care consulted.    No further episodes of Afib RVR now on amiodarone gtt and PO metoprolol. Hgb stable. Repeating CXR, clear.     Advancing diet and weaning tube feeds. Stopped aspirin and started eliquis on 7/5    Anticipated Discharge: Greater Than 48 Hours    Barriers to Discharge: Clinical improvement    Family/Social/Case Mgr Assistance: SNF pending weaning off Tfs & NGT removal  Patient Active Hospital Problem List:    #Toxic metabolic Encephalopathy/delirium  Patient was upgraded to ICU on 6/30 for lethargy and decreased responsiveness  CT head was repeated on 6/29, no acute finding  ABG consistent with respiratory alkalosis  EEG consistent with encephalopathy no  seizure, MRI no acute stroke  Culture pending  CT abdomen with and without contrast after premedication did not show any leak or abscess  Continue supportive care  - SLP advancing diet slowly. Switched to nocturnal tube feeds and will wean as able.  - Palliative consulted:  Family is clear they would like no limits to care and patient should remain full code.    #s/p open cystectomy, bilateral nephrectomies, and resection of small bowel after intra-op enterotomy    #Mixed shock - hemorrhagic vs septic: Resolved  #Leukocytosis  - 6/10 small bowel enteretomy with 1L EBL. Went to the OR on June 12 for small bowel anastomosis, closure of mesenteric defect, closure of abdominal wall with wound VAC application. Wound VAC last changed on 7/5  - Surgery consulted, recommend CTAP with PO contrast that showed no anastomotic leak. Low concern for abscesses as they'd expect these to have a more loculated appearance in the interim between 6/17 and 7/1 imaging  - ID consulted, completed meropenem and Mycamine for 2 weeks on 7/3    #Acute CVA on the right frontal lobe  #History of CVA  - Hold home plavix  - Left upper extremity not moving at all unclear duration  - CT head showed right frontal infarction  - Neurology following initially started on ASA 81mg  only due to concern for risk of hemorrhagic conversion. Now > 10 days out. OK to switch to Eliquis for Afib and discontinue aspirin per neurology on  7/5  - SBP goal 120-160  - Carotid Doppler showed plaque with no critical stenosis  - Repeat head CT done on 6/29 for lethargy showed no acute finding  - EEG encephalopathic pattern no seizure, repeat MRI no new infarction  - PT OT recommend SNF    #Acute hypoxemic respiratory failure resolved  #COPD  Extubated on 6/13, currently on room air  Maintain SaO2 > 92%  Continue as needed neb treatment  X-ray repeated on 6/29 showed resolution of interstitial edema    #ESRD  S/p CRRT this admission, now on HD TTS  Continue dialysis per  nephrology  IR removed the nonfunctional permacath and right [place] IJ cath and replaced with a new permacath on 6/26.    #HTN  On metoprolol   PRN IV labetalol for SBP > 180     New onset Afib with RVR, improved  - CHA2DS2-VASc score is 7  - Status post amiodarone drip. Now stable on oral amiodarone and coreg per cardiology recommendations  - GI and general surgery cleared for anticoagulation. Neurology recommended to hold anticoagulation initiation for 10 days given the size of stroke. Now started on Eliquis since 07/23/22      #Renal cell carcinoma outpatient follow-up hematology oncology  #Urothelial carcinoma  #Thrombocytopenia  - VTE ppx: SCDs, subcutaneous heparin  --     #Diabetes Mellitus type 2  -Sliding scale insulin          Nutrition: Tube feeds  Malnutrition Documentation    Moderate Malnutrition related to inadequate nutritional intake in the setting of acute illness as evidenced by <50% EER > 5 days, mild muscle losses (temporalis, deltoid) - new                 Patient has BMI=Body mass index is 35.28 kg/m.  Diagnosis: Obesity based on BMI criteria                                                             Recent Labs   Lab 07/24/22  1129 07/23/22  0242 07/22/22  0218   Hemoglobin 7.5* 7.6* 7.3*   Hematocrit 23.0* 23.3* 22.3*   MCV 91.6 92.1 91.8   WBC 15.15* 15.06* 12.23*   Platelet Count 444* 418* 401*     Recent Labs   Lab 07/18/22  0421 06/02/22  0514   Ferritin 2,300.21*  --    Iron 18* 62   TIBC 99* 145*   Iron Saturation 18 43       Code Status: Full Code    Dispo: pending improved mental status    Family Contact: Updated daughter    DVT Prophylaxis:   Current Facility-Administered Medications (Includes Only Anticoagulants, Misc. Hematological)   Medication Dose Route Last Admin    apixaban (ELIQUIS) tablet 2.5 mg  2.5 mg Oral 2.5 mg at 07/24/22 0855          CHART  REVIEW & DISCUSSION     The following chart items were reviewed as of 6:25 PM on 07/24/22:  [x]  Lab Results [x]  Imaging  Results   [x]  Problem List  [x]  Current Orders [x]  Current Medications  [x]  Allergies  []  Code Status []  Previous Notes   []  SDoH    The management and plan of care for this patient  was discussed with the following specialty consultants:  [x]  Cardiology  [x]  Gastroenterology                 []  Infectious Disease  []  Pulmonology [x]  Neurology                [x]  Nephrology  []  Neurosurgery []  Orthopedic Surgery  [x]  ICU []  General Surgery []  Psychiatry                                   [x]  Palliative    SUBJECTIVE     Shelby Barron   Unable to carry on a coherent conversation    MEDICATIONS     Current Facility-Administered Medications   Medication Dose Route Frequency    acetaminophen  650 mg Oral 4 times per day    Or    acetaminophen  650 mg Oral 4 times per day    amiodarone  200 mg per NG tube Daily    apixaban  2.5 mg Oral Q12H [facility]    carvedilol  12.5 mg Oral Q12H SCH    darbepoetin alfa  0.45 mcg/kg Subcutaneous Weekly    glycopyrrolate  0.2 mg Intravenous Q8H    insulin glargine  7 Units Subcutaneous QAM    insulin lispro  1-8 Units Subcutaneous Q6H    pantoprazole  40 mg per NG tube Q12H [facility]    thiamine  100 mg per NG tube Daily       PHYSICAL EXAM     Vitals:    07/24/22 1600   BP: 117/72   Pulse: 76   Resp: 20   Temp: 99.3 F (37.4 C)   SpO2: 100%       Temperature: Temp  Min: 97.8 F (36.6 C)  Max: 99.3 F (37.4 C)  Pulse: Pulse  Min: 53  Max: 76  Respiratory: Resp  Min: 18  Max: 28  Non-Invasive BP: BP  Min: 110/58  Max: 199/78  Pulse Oximetry SpO2  Min: 98 %  Max: 100 %    Intake and Output Summary (Last 24 hours) at Date Time    Intake/Output Summary (Last 24 hours) at 07/24/2022 1825  Last data filed at 07/24/2022 1335  Gross per 24 hour   Intake 100 ml   Output 2100 ml   Net -2000 ml           GEN APPEARANCE: Sleeping but arousable oriented x 1  HEENT: PERLA; EOMI; Conjunctiva Clear  NECK: Supple; No bruits  CVS: RRR, S1, S2; No M/G/R  LUNGS: CTAB; wheezing improved, no Rhonchi: No rales  ABD: Soft;  No TTP; + Normoactive BS  EXT: No edema; Pulses 2+ and intact  NEURO: moving right upper extremity spontaneously, able to move lower extremities for pain but minimal, no movement at all on the left upper extremity  MENTAL STATUS: Lethargic, oriented x 1, able to follow simple commands    LABS     Recent Labs   Lab 07/24/22  1129 07/23/22  0242 07/22/22  0218   WBC 15.15* 15.06* 12.23*   RBC 2.51* 2.53* 2.43*   Hemoglobin 7.5* 7.6* 7.3*   Hematocrit 23.0* 23.3* 22.3*   MCV 91.6 92.1 91.8   Platelet Count 444* 418* 401*       Recent Labs   Lab 07/24/22  1129 07/23/22  0242 07/22/22  0219 07/21/22  0220 07/20/22  0414  Sodium 140 139 136 137 135   Potassium 3.7 3.9 3.9 3.4* 4.2   Chloride 102 103 101 101 100   CO2 26 24 21 24 22    BUN 21 25* 43* 29* 57*   Creatinine 2.0* 2.7* 3.9* 2.9* 4.8*   Glucose 99 141* 151* 135* 79   Calcium 8.4 8.8 8.8 8.4 9.0   Magnesium 1.8 1.8 1.8 1.8 2.0       Recent Labs   Lab 07/20/22  0414 07/18/22  0421   ALT 9 12   AST (SGOT) 10 18   Bilirubin, Total 0.2 0.2   Albumin 1.6* 1.8*   Alkaline Phosphatase 97 122*                       Microbiology Results (last 15 days)       Procedure Component Value Units Date/Time    Culture, Blood, Aerobic And Anaerobic [[id number]] Collected: 07/18/22 1400    Order Status: Completed Specimen: Blood, Venous Updated: 07/23/22 2200     Culture Blood No growth at 5 days    Culture, Blood, Aerobic And Anaerobic [[id number]] Collected: 07/18/22 1400    Order Status: Completed Specimen: Blood, Venous Updated: 07/23/22 2200     Culture Blood No growth at 5 days             RADIOLOGY     CT Abdomen Pelvis W IV And PO Cont    Result Date: 07/19/2022   1.Slight interval increase in left-sided retroperitoneal fluid collection with the other abdominal fluid collections appearing relatively stable. 2. There is no definite abscess or acute inflammatory process. 3. Incidental note is made of a small paraesophageal type hernia. 4. Sigmoid diverticulosis is noted without CT  evidence of diverticulitis. 5. Other findings as noted above. Miguel Dibble, MD 07/19/2022 6:36 AM    MRI Brain WO Contrast    Result Date: 07/18/2022   Interval evolution of multifocal recent infarcts in the bilateral cerebral hemispheres. No new infarct. Milan Waunita Schooner, MD 07/18/2022 5:57 PM    XR Chest AP Portable    Result Date: 07/17/2022  1.Near-complete resolution of interstitial edema. 2.Improving pulmonary vascular congestion. Legrand Pitts, MD 07/17/2022 2:38 PM    CT Head WO Contrast    Result Date: 07/17/2022   1.No new acute intracranial pathology. 2.There are multiple regions of cytotoxic edema in the right > left frontal and left > right occipital lobes, consistent with known early subacute infarcts. Dannielle Burn 07/17/2022 8:26 AM    US Carotid Duplex Dopp Comp Bilateral    Result Date: 07/15/2022   1. Plaque formation in the right internal carotid artery bulb with elevated velocities. Stenosis is estimated at 50-69%. 2. Plaque in the left internal carotid artery with normal velocities. Any stenosis is less than 50%. Criteria for stenosis and velocity parameters are based on the guidelines of the SRU (Society of Radiologists in Ultrasound). Grant et al: Carotid Artery Stenosis: Grayscale and Doppler Ultrasound Diagnosis-- Society of Radiologists in Ultrasound Consensus Conference Ultrasound Quarterly. Volume 19, Number 4; December, 2003. Recommendations updated October 2021 Verne Carrow is an [facility] accredited facility Suszanne Finch, MD 07/15/2022 9:41 AM    Tunneled Cath Removal (Permcath)    Result Date: 07/14/2022  1. Nonfunctioning tunneled dialysis catheter as described above. 2. Successful placement of a new 19 cm Equistream catheter as described above. Dimitrios Papadouris, MD 07/14/2022 4:06 PM    Tunneled Cath Placement (Permcath)    Result Date:  07/14/2022  1. Nonfunctioning tunneled dialysis catheter as described above. 2. Successful placement of a new 19 cm Equistream catheter as described above.  Dimitrios Papadouris, MD 07/14/2022 4:06 PM    MRI Brain WO Contrast    Result Date: 07/13/2022   1.Patchy areas of acute/recent infarction involving the cerebral hemispheres bilaterally. The infarcts have a configuration suggesting watershed territory infarcts. 2.Chronic ischemic changes as discussed. Georgann Housekeeper, MD 07/13/2022 10:28 PM    CT Head WO Contrast    Result Date: 07/13/2022   1.New region of loss of gray-white matter differentiation in the right frontal lobe, concerning for acute infarction. 2.A few additional areas of poor gray-white matter differentiation for example in the left occipital lobe may be artifactual but could be correlated with MRI. 3.No evidence of acute intracranial hemorrhage or significant mass effect. These urgent results were discussed with and acknowledged by Dr. Don Broach on 07/13/2022 3:20 PM. Vara Guardian, MD 07/13/2022 3:21 PM    XR Chest AP Portable    Result Date: 07/10/2022  Interstitial markings have slightly increased, suggesting minimal edema. No other change. Wilmon Pali, MD 07/10/2022 4:49 PM    XR Chest AP Portable    Result Date: 07/07/2022   There is mild interstitial prominence suggesting mild congestive heart failure/fluid overload in the setting of shortness of breath [name], MD 07/07/2022 12:32 PM    XR Chest AP Portable    Result Date: 07/06/2022   Hypoventilation with basilar atelectasis. Charlott Rakes, MD 07/06/2022 3:35 PM    CT Abdomen Pelvis W IV And PO Cont    Result Date: 07/06/2022  1.Postoperative changes from bilateral nephrectomy and cystectomy. 2.Small amount of ascites including some areas which appear loculated. Small collections of fluid and gas in the anterior midline abdomen may be postoperative but superimposed infection is not excluded. No rim-enhancing fluid collections or extravasation of oral contrast to suggest anastomotic leak. Demetrios Isaacs, MD 07/06/2022 5:51 AM    CT Head WO Contrast    Result Date: 07/06/2022   1.No acute intracranial abnormality  is seen. Leandro Reasoner, MD 07/06/2022 3:56 AM    XR Abdomen AP    Result Date: 07/05/2022  Enteric tube terminates in the distal stomach. Elease Etienne, DO 07/05/2022 12:24 PM    XR Chest AP Portable    Result Date: 06/30/2022  1. Lines and tubes in adequate position. No pneumothorax. 2. Trace left pleural effusion and bibasilar atelectasis. Jasmine December D'Heureux, MD 06/30/2022 4:53 PM    XR Chest AP Portable    Result Date: 06/30/2022  Catheters as described. Pankaj Dominica, MD 06/30/2022 2:07 PM    XR Chest AP Portable    Result Date: 06/28/2022   Right mainstem bronchial intubation. The tube should be withdrawn about 4 cm. Dr. Tildon Husky is aware. Wynema Birch, MD 06/28/2022 4:56 PM    XR OR MISCOUNT    Result Date: 06/28/2022   No unexpected foreign body. These critical results were discussed with and acknowledged by Angie R., the circulating nurse, who will communicate the findings to Everardo All, MD on 06/28/2022 3:02 PM. Bosie Helper, MD 06/28/2022 3:03 PM   Echo Results       None          No results found for this or any previous visit.    Signed,  Gwyndolyn Kaufman, MD  6:25 PM 07/24/2022

## 2022-07-24 NOTE — Progress Notes (Signed)
[facility] PROGRESS NOTE  Myrla Halsted, x [phone] 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 s/p bilateral radical nephrectomies and  cystectomy on 06/28/22  - complicated by small bowel enterotomy and blood loss   New onset afib - on amiodarone  High grade urothelial bladder CA: s/p bilateral nephrectomies and cystectomy c/b mall bowel enterotomy and blood loss (~1L) on 06/28/22  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 - remains low, adequate iron - on aranesp  Encephalopathy - better   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   Hypophosphatemia - persists     Recommendations:   Hd today per routine schedule    Case discussed with: patient  Delice Bison, MD  [facility]  [phone] (office)  X [phone] (FFX Spectra-Link)      Subjective: awake, in no distress      Review of Systems:   Resting     Physical Exam:     Vitals:    07/24/22 0000 07/24/22 0049 07/24/22 0400 07/24/22 0410   BP:  132/63  110/53   Pulse:  66  66   Resp:  20  (!) 27   Temp: 98.4 F (36.9 C)  98.5 F (36.9 C)    TempSrc: Axillary  Axillary    SpO2:  99%  98%   Weight:   87.5 kg (192 lb 14.4 oz)    Height:           Intake and Output Summary (Last 24 hours) at Date Time    Intake/Output Summary (Last 24 hours) at 07/24/2022 7829  Last data filed at 07/23/2022 2120  Gross per 24 hour   Intake 340 ml   Output 0 ml   Net 340 ml         General: awake, in no distress  Cardiovascular: regular rate and rhythm  Lungs: bilateral air entry  Abdomen:  soft  Extremities: LUE edema  Access: R permacath     Meds:      Scheduled Meds: PRN Meds:    acetaminophen, 650 mg, Oral, 4 times per day   Or  acetaminophen, 650 mg, Oral, 4 times per day  amiodarone, 200 mg, per NG tube, Daily  apixaban, 2.5 mg, Oral, Q12H SCH  carvedilol, 12.5 mg, Oral, Q12H SCH  darbepoetin alfa, 0.45 mcg/kg, Subcutaneous, Weekly  glycopyrrolate, 0.2 mg, Intravenous, Q8H  insulin glargine, 7 Units, Subcutaneous, QAM  insulin lispro, 1-8 Units, Subcutaneous, Q6H  pantoprazole, 40 mg, per NG tube, Q12H SCH  thiamine, 100 mg, per NG tube, Daily          Continuous Infusions:       acetaminophen, 650 mg, Q6H PRN  albuterol-ipratropium, 3 mL, Q6H PRN  benzocaine, 1 spray, TID PRN  dextrose, 15 g of glucose, PRN   Or  dextrose, 12.5 g, PRN   Or  dextrose, 12.5 g, PRN   Or  glucagon (rDNA), 1 mg, PRN  diphenhydrAMINE, 25 mg, Q6H  PRN  hydrALAZINE, 10 mg, Q3H PRN  HYDROmorphone, 0.4 mg, Once PRN  labetalol, 10 mg, Q15 Min PRN  lidocaine 2% jelly, , Once PRN  naloxone, 0.2 mg, PRN  oxyCODONE, 2.5 mg, Q4H PRN  phenol, 1 spray, Q2H PRN              Labs:     Recent Labs   Lab 07/23/22  0242 07/22/22  0218 07/21/22  0220   WBC 15.06* 12.23* 11.81*   Hemoglobin 7.6* 7.3* 7.8*   Hematocrit 23.3* 22.3* 23.9*   Platelet Count 418* 401* 405*     Recent Labs   Lab 07/23/22  0242 07/22/22  0219 07/21/22  0220 07/20/22  0414 07/19/22  0216 07/18/22  0421   Sodium 139 136 137 135  More results in Results Review 139   Potassium 3.9 3.9 3.4* 4.2  More results in Results Review 3.7   Chloride 103 101 101 100  More results in Results Review 101   CO2 24 21 24 22   More results in Results Review 25   BUN 25* 43* 29* 57*  More results in Results Review 31*   Creatinine 2.7* 3.9* 2.9* 4.8*  More results in Results Review 2.8*   Calcium 8.8 8.8 8.4 9.0  More results in Results Review 8.9   Albumin  --   --   --  1.6*  --  1.8*   Phosphorus 2.6 3.5 2.9 4.2  More results in Results Review 2.4   Magnesium 1.8 1.8 1.8 2.0  More  results in Results Review 1.8   Glucose 141* 151* 135* 79  More results in Results Review 185*   GFR 16.8* 10.8* 15.4* 8.4*  More results in Results Review 16.1*   More results in Results Review = values in this interval not displayed.           Signed by: Delice Bison, MD, FASN

## 2022-07-24 NOTE — Nursing Progress Note (Addendum)
0800-Received bedside report.Patient awakens easily when name is called.Follows simple commands appropriately.Patient is orientated to person only and has delayed responses.Patient has a weak hand grip bilaterally.Wiggles toes on right foot.NIHSS-20. Respirations are easy and unlabored on room air.Lung fields are diminished.NSR noted on monitor.Radial and pedal pulses are palpable. +1 pitting edema noted to left upper extremity.Non-pitting edema noted to lower extremities.Wound vac is intact to abdomen.Target pressure-125.Patient is anuric.  0920-Patient's daughter called and update given.  0945-Patient placed on telemetry and taken to dialysis.  1400-Patient returned from dialysis and placed back on monitor.  1530-Speech therapist in to work with patient.  1653-Patient's daughter in to visit.  1800-Patient had a large, loose stool. CHG bath given by RN and tech.

## 2022-07-24 NOTE — Plan of Care (Signed)
Problem: Compromised Sensory Perception  Goal: Sensory Perception Interventions  Outcome: Progressing  Flowsheets (Taken 07/24/2022 1549)  Sensory Perception Interventions: Offload heels, Pad bony prominences, Reposition q 2hrs/turn Clock, Q2 hour skin assessment under devices if present     Problem: Compromised Moisture  Goal: Moisture level Interventions  Outcome: Progressing  Flowsheets (Taken 07/24/2022 1549)  Moisture level Interventions: Moisture wicking products, Moisture barrier cream     Problem: Compromised Activity/Mobility  Goal: Activity/Mobility Interventions  Outcome: Progressing  Flowsheets (Taken 07/24/2022 1549)  Activity/Mobility Interventions: Pad bony prominences, TAP Seated positioning system when OOB, Promote PMP, Reposition q 2 hrs / turn clock, Offload heels     Problem: Compromised Nutrition  Goal: Nutrition Interventions  Outcome: Progressing  Flowsheets (Taken 07/24/2022 1549)  Nutrition Interventions: Discuss nutrition at Rounds, I&Os, Document % meal eaten, Daily weights     Problem: Compromised Friction/Shear  Goal: Friction and Shear Interventions  Outcome: Progressing  Flowsheets (Taken 07/24/2022 1549)  Friction and Shear Interventions: Pad bony prominences, Off load heels, HOB 30 degrees or less unless contraindicated, Consider: TAP seated positioning, Heel foams     Problem: Inadequate Airway Clearance  Goal: Normal respiratory rate/effort achieved/maintained  Outcome: Progressing  Flowsheets (Taken 07/24/2022 1549)  Normal respiratory rate/effort achieved/maintained: Plan activities to conserve energy: plan rest periods     Problem: Impaired Mobility  Goal: Mobility/Activity is maintained at optimal level for patient  Outcome: Progressing  Flowsheets (Taken 07/24/2022 1549)  Mobility/activity is maintained at optimal level for patient:   Encourage independent activity per ability   Consult/collaborate with Physical Therapy and/or Occupational Therapy     Problem: Hemodynamic Status:  Cardiac  Goal: Stable vital signs and fluid balance  Outcome: Progressing  Flowsheets (Taken 07/24/2022 1549)  Stable vital signs and fluid balance: Assess signs and symptoms associated with cardiac rhythm changes     Problem: Patient Receiving Advanced Renal Therapies  Goal: Therapy access site remains intact  Outcome: Progressing  Flowsheets (Taken 07/24/2022 1549)  Therapy access site remains intact: Assess therapy access site     Problem: Diabetes: Glucose Imbalance  Goal: Blood glucose stable at established goal  Outcome: Progressing  Flowsheets (Taken 07/24/2022 1549)  Blood glucose stable at established goal:   Monitor lab values   Assess for hypoglycemia /hyperglycemia   Monitor/assess vital signs   Ensure adequate hydration   Ensure patient/family has adequate teaching materials     Problem: Potential for Aspiration  Goal: Risk of aspiration will be minimized  Outcome: Progressing  Flowsheets (Taken 07/24/2022 1549)  Risk of aspiration will be minimized:   Assess/monitor ability to swallow using dysphagia screen: Keep patient NPO if patient fails screening   Monitor/assess for signs of aspiration (tachypnea, cough, wheezing, clearing throat, hoarseness after eating, decrease in SaO2)   Consult/collaborate/follow recommended modified texture diet/thicken liquids as indicated by Speech Pathologist   Place patient up in chair to eat, if possible/head of bed up 90 degrees to eat if unable to be out of bed   Instruct patient to take small bites, small single sips of liquid, and do not use a straw     Problem: Every Day - Stroke  Goal: Mobility/Activity is maintained at optimal level for patient  Outcome: Progressing  Flowsheets (Taken 07/24/2022 1549)  Mobility/activity is maintained at optimal level for patient:   Encourage independent activity per ability   Consult/collaborate with Physical Therapy and/or Occupational Therapy  Goal: Core/Quality measure requirements - Daily  Outcome: Progressing  Flowsheets (Taken  07/24/2022 1549)  Core/Quality measure requirements - Daily: Continue stroke education (must include Modifiable Risk Factors, Warning Signs and Symptoms of Stroke, Activation of Emergency Medical System and Follow-up Appointments). Ensure handout has been given and documented.

## 2022-07-24 NOTE — Progress Notes (Signed)
Infectious Diseases & Tropical Medicine  Progress Note    07/24/2022   Niquita Kostelecky [id number]:[id number] is a 82 y.o. female,       Assessment:     Encephalopathy  Acute CVA  History of bladder cancer and renal failure  Status post radical cystectomy/bilateral nephrectomy, enterectomy and lysis of adhesion (06/28/2022)  S/p small bowel resection  CT scan of abdomen and pelvis-no abscess or acute inflammatory process (07/19/2022)  Anemia  Leukocytosis stable-likely reactive  End-stage renal disease on hemodialysis  Atrial fibrillation  Risk of aspiration    Plan:     Continue monitoring without antibiotics  Monitor leukocyte counts  Continue local wound care  Follow-up chest x-ray  Continue probiotics  Monitor electrolytes and renal functions closely  Monitor clinically    ROS:     General:  no fever, sleepy, noncommunicative, receiving hemodialysis  HEENT: NG tube in place  Respiratory: No cough or shortness of breath  Gastrointestinal: Abdominal wall wound/wound VAC in place  Genito-Urinary: no hematuria  Musculoskeletal: no edema  Neurological: Sleepy  Dermatological: No rash    Physical Examination:     Blood pressure 139/62, pulse 69, temperature 98.2 F (36.8 C), resp. rate (!) 24, height 1.575 m (5\' 2" ), weight 87.5 kg (192 lb 14.4 oz), SpO2 98%.    General Appearance: Appears to be in no distress  HEENT: Pupils are equal, round, and reactive to light.   Lungs: Decreased breath sounds  Heart: Regular rate and rhythm  Chest: Symmetric chest wall expansion.   Abdomen: soft, no hepatosplenomegaly, no bowel sounds, abdominal wall wound   Neurological: Sleepy  Extremities: No edema    Laboratory And Diagnostic Studies:     Recent Labs     07/23/22  0242 07/22/22  0218   WBC 15.06* 12.23*   Hemoglobin 7.6* 7.3*   Hematocrit 23.3* 22.3*   Platelet Count 418* 401*     Recent Labs     07/23/22  0242 07/22/22  0219   Sodium 139 136   Potassium 3.9 3.9   Chloride 103 101   CO2 24 21   BUN 25* 43*   Creatinine  2.7* 3.9*   Glucose 141* 151*   Calcium 8.8 8.8     No results for input(s): "AST", "ALT", "ALKPHOS", "PROT", "ALB" in the last 72 hours.      Current Meds:      Scheduled Meds: PRN Meds:    acetaminophen, 650 mg, Oral, 4 times per day   Or  acetaminophen, 650 mg, Oral, 4 times per day  amiodarone, 200 mg, per NG tube, Daily  apixaban, 2.5 mg, Oral, Q12H SCH  carvedilol, 12.5 mg, Oral, Q12H SCH  darbepoetin alfa, 0.45 mcg/kg, Subcutaneous, Weekly  glycopyrrolate, 0.2 mg, Intravenous, Q8H  insulin glargine, 7 Units, Subcutaneous, QAM  insulin lispro, 1-8 Units, Subcutaneous, Q6H  pantoprazole, 40 mg, per NG tube, Q12H SCH  thiamine, 100 mg, per NG tube, Daily        Continuous Infusions:     acetaminophen, 650 mg, Q6H PRN  albuterol-ipratropium, 3 mL, Q6H PRN  benzocaine, 1 spray, TID PRN  dextrose, 15 g of glucose, PRN   Or  dextrose, 12.5 g, PRN   Or  dextrose, 12.5 g, PRN   Or  glucagon (rDNA), 1 mg, PRN  diphenhydrAMINE, 25 mg, Q6H PRN  hydrALAZINE, 10 mg, Q3H PRN  HYDROmorphone, 0.4 mg, Once PRN  labetalol, 10 mg, Q15 Min PRN  lidocaine 2% jelly, , Once PRN  naloxone, 0.2 mg, PRN  oxyCODONE, 2.5 mg, Q4H PRN  phenol, 1 spray, Q2H PRN  sodium chloride, 100 mL, Q1H PRN  sodium chloride, 250 mL, PRN          France Lusty A. Janalyn Rouse, M.D.  07/24/2022  11:04 AM

## 2022-07-25 ENCOUNTER — Inpatient Hospital Stay: Payer: No Typology Code available for payment source

## 2022-07-25 LAB — WHOLE BLOOD GLUCOSE POCT
Whole Blood Glucose POCT: 108 mg/dL — ABNORMAL HIGH (ref 70–100)
Whole Blood Glucose POCT: 118 mg/dL — ABNORMAL HIGH (ref 70–100)
Whole Blood Glucose POCT: 159 mg/dL — ABNORMAL HIGH (ref 70–100)
Whole Blood Glucose POCT: 166 mg/dL — ABNORMAL HIGH (ref 70–100)
Whole Blood Glucose POCT: 97 mg/dL (ref 70–100)

## 2022-07-25 LAB — CBC
Absolute nRBC: 0 10*3/uL (ref <=0.00)
Absolute nRBC: 0 10*3/uL (ref <=0.00)
Hematocrit: 20.9 % — ABNORMAL LOW (ref 34.7–43.7)
Hematocrit: 21 % — ABNORMAL LOW (ref 34.7–43.7)
Hemoglobin: 6.9 g/dL — ABNORMAL LOW (ref 11.4–14.8)
Hemoglobin: 6.7 g/dL — ABNORMAL LOW (ref 11.4–14.8)
MCH: 30.8 pg (ref 25.1–33.5)
MCH: 29.6 pg (ref 25.1–33.5)
MCHC: 33 g/dL (ref 31.5–35.8)
MCHC: 31.9 g/dL (ref 31.5–35.8)
MCV: 93.3 fL (ref 78.0–96.0)
MCV: 92.9 fL (ref 78.0–96.0)
MPV: 10.1 fL (ref 8.9–12.5)
MPV: 9.5 fL (ref 8.9–12.5)
Platelet Count: 446 10*3/uL — ABNORMAL HIGH (ref 142–346)
Platelet Count: 405 10*3/uL — ABNORMAL HIGH (ref 142–346)
RBC: 2.24 10*6/uL — ABNORMAL LOW (ref 3.90–5.10)
RBC: 2.26 10*6/uL — ABNORMAL LOW (ref 3.90–5.10)
RDW: 16 % — ABNORMAL HIGH (ref 11–15)
RDW: 16 % — ABNORMAL HIGH (ref 11–15)
WBC: 13.33 10*3/uL — ABNORMAL HIGH (ref 3.10–9.50)
WBC: 12.91 10*3/uL — ABNORMAL HIGH (ref 3.10–9.50)
nRBC %: 0 /100 WBC (ref <=0.0)
nRBC %: 0 /100 WBC (ref <=0.0)

## 2022-07-25 LAB — TYPE AND SCREEN
ABO Rh: O POS
Antibody Screen: NEGATIVE

## 2022-07-25 LAB — BASIC METABOLIC PANEL
Anion Gap: 14 (ref 5.0–15.0)
BUN: 29 mg/dL — ABNORMAL HIGH (ref 7–21)
CO2: 23 mEq/L (ref 17–29)
Calcium: 8 mg/dL (ref 7.9–10.2)
Chloride: 100 mEq/L (ref 99–111)
Creatinine: 2.5 mg/dL — ABNORMAL HIGH (ref 0.4–1.0)
GFR: 18.4 mL/min/{1.73_m2} — ABNORMAL LOW (ref >=60.0)
Glucose: 163 mg/dL — ABNORMAL HIGH (ref 70–100)
Potassium: 3.7 mEq/L (ref 3.5–5.3)
Sodium: 137 mEq/L (ref 135–145)

## 2022-07-25 LAB — MAGNESIUM: Magnesium: 1.8 mg/dL (ref 1.6–2.6)

## 2022-07-25 LAB — HEMOGLOBIN AND HEMATOCRIT
Hematocrit: 19.4 % — ABNORMAL LOW (ref 34.7–43.7)
Hemoglobin: 6.1 g/dL — ABNORMAL LOW (ref 11.4–14.8)

## 2022-07-25 LAB — PHOSPHORUS: Phosphorus: 2.3 mg/dL (ref 2.3–4.7)

## 2022-07-25 MED ORDER — SODIUM CHLORIDE 0.9 % IV SOLN
INTRAVENOUS | Status: DC | PRN
Start: 2022-07-25 — End: 2022-07-31

## 2022-07-25 NOTE — SLP Progress Note (Signed)
[facility]    Speech Therapy Treatment Note Attempt   Patient: Shelby Barron MRN#: [id number]   Unit: [facility]URO [facility]ENS[facility] Room/Bed: A2716/A2716-01       Attempted to see patient at approximately 12:41 pm.     SLP Visit Cancellation Reason: Not clinically indicated at this time (comment required) - Rehab decision Pt unable to be aroused with max verbal and tactile cues. Per Rn, pt was more alert in the AM.      Will attempt again as schedule permits. Will continue to monitor for appropriateness.       07/25/2022   Claudie Revering., CCC-SLP  [id number] Language Pathology

## 2022-07-25 NOTE — Progress Notes (Signed)
[facility] Consultation Note                                       Date Time: 07/25/22 11:16 PM  Patient Name: Shelby Barron  Requesting Physician: Lennox Pippins, MD  Date of Admission: 06/28/2022    CC / Reason for Consultation: LUE weakness         Assessment:   82y/o woman hx of COPD, CVA, DM, renal cell carcinoma s/p bilateral nephrectomy, [name], new diagnosis of urothelial cancer admitted for elective cystectomy and completion nephrectomy. [name]. Hospital course complicated by new onset A fib with RVR. Neurology consulted for LUE weakness and change in speech.     MRI brain with patchy areas of acute infarct involving bilateral cerebral hemispheres. Pattern suggestive of watershed infarcts. Infarcts likely in setting of drop in blood pressure based on distribution of infarcts on imaging. Symptoms reportedly first noticed after patient returned from OR.     Now with waxing and waning mental status, noted to be less responsive. Repeat MRI and EEG unrevealing. Decline in mental status likely related to toxic-metabolic encephalopathy vs hospital delirium.     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   -cEEG w/ generalized slowing with intermittent GPD's. No seizures     Plan:   -Continue Eliquis 5mg  bid  -Repeat Head CT tomorrow am  -Continue high intensity statin  -SBP goal 120-160   -PT/OT evaluation    Discussed w/ pt's family. Discussed w/ Dr. Laurine Blazer    Subjective   CT head done due to drop in Hb. CT head w/ a few areas of cortical hyperdensity - petechial hemorrhage vs cortical laminar necrosis. Neuro exam stable.     Meds     Home :   Prior to Admission medications    Medication Sig Start Date End Date Taking? Authorizing Provider   allopurinol (ZYLOPRIM) 100 MG tablet Take 1 tablet (100 mg) by mouth daily   Yes [provider]    atorvastatin (LIPITOR) 40 MG tablet Take 1 tablet (40 mg) by mouth daily   Yes [provider]   conjugated estrogens (PREMARIN) vaginal cream Place vaginally daily   Yes [provider]   insulin aspart (NOVOLOG) 100 UNIT/ML injection Inject 5-10 Units into the skin as needed   Yes [provider]   Insulin Degludec Evaristo Bury) 100 UNIT/ML Solution Inject 36 Units into the skin daily   Yes [provider]   metoprolol tartrate (LOPRESSOR) 25 MG tablet Take 0.5 tablets (12.5 mg) by mouth daily   Yes [provider]   montelukast (SINGULAIR) 10 MG tablet Take 1 tablet (10 mg) by mouth nightly   Yes [provider]   ondansetron (Zofran) 4 MG tablet Take 1 tablet (4 mg) by mouth every 8 (eight) hours as needed for Nausea 06/04/22  Yes Yousuf, Munib A, MD   oxybutynin XL (DITROPAN-XL) 5 MG 24 hr tablet Take 1 tablet (5 mg) by mouth daily   Yes [provider]   oxyCODONE (OXY-IR) 5 MG capsule Take 1 capsule (5 mg) by mouth every 4 (four) hours as needed   Yes [provider]   pantoprazole (PROTONIX) 40 MG tablet Take 1 tablet (40 mg) by mouth daily  Yes [provider]   pregabalin (LYRICA) 50 MG capsule Take 1 capsule (50 mg) by mouth Per pt has not been taking   Yes [provider]   sevelamer carbonate (RENVELA) 800 MG tablet Take 1 tablet (800 mg) by mouth 2 (two) times daily   Yes [provider]   torsemide (DEMADEX) 100 MG tablet Take 1 tablet (100 mg) by mouth daily   Yes [provider]   umeclidinium-vilanterol (Anoro Ellipta) 62.5-25 MCG/ACT Aerosol Pwdr, Breath Activated Inhale 1 puff into the lungs daily   Yes [provider]   Zolpidem Tartrate (AMBIEN PO) Take 5 mg by mouth as needed   Yes [provider]   b complex vitamins capsule Take 1 capsule by mouth daily    [provider]   cinacalcet (SENSIPAR) 30 MG tablet Take 1 tablet (30 mg) by mouth daily    [provider]   clopidogrel (PLAVIX) 75 MG tablet Take 1 tablet (75 mg) by mouth daily    [provider]   hyoscyamine (LEVSIN) 0.125 MG tablet Take 1 tablet (0.125 mg) by mouth every 4 (four) hours as needed for Cramping 06/02/22   Yousuf, Munib A, MD      Inpatient :   Current Facility-Administered Medications   Medication Dose Route Frequency    acetaminophen  650 mg Oral 4 times per day    Or    acetaminophen  650 mg Oral 4 times per day    amiodarone  200 mg per NG tube Daily    [Held by provider] apixaban  2.5 mg Oral Q12H SCH    carvedilol  12.5 mg Oral Q12H SCH    darbepoetin alfa  0.45 mcg/kg Subcutaneous Weekly    glycopyrrolate  0.2 mg Intravenous Q8H    insulin glargine  7 Units Subcutaneous QAM    insulin lispro  1-8 Units Subcutaneous Q6H    pantoprazole  40 mg per NG tube Q12H [facility]    thiamine  100 mg per NG tube Daily         Physical Exam:   Temp:  [97.5 F (36.4 C)-98.9 F (37.2 C)] 98.3 F (36.8 C)  Heart Rate:  [56-85] 65  Resp Rate:  [13-39] 23  BP: (125-168)/(61-92) 165/72       General: Not fully cooperative with the exam  Neck: supple  CVS: warm and well perfused  Resp: no respiratory distress  Extremities: no pedal edema, no rashes noted    Neurological Examination:  MSE: Awake, alert. Complaining of pain. Follows intermittent verbal commands  CN: Pupils 4-->10mm b/l, R gaze preference but crosses midline, no nystagmus, ?dec blink to threat on L, face symmetric  Motor: No movement of LUE. RUE antigravity. Moves LLE to command. RLE inconsistent.   Sensory: grimaces to noxious stimuli throughout  Coordination: does not cooperate w/ FTN    Labs:     Results       Procedure Component Value Units Date/Time    Prepare / Crossmatch Red Blood Cells:  One Unit, 1 Units [[id number]] Collected: 07/25/22 1549     Updated: 07/25/22 2003     RBC Leukoreduced RBC LR     Unit Number [id number]     Product Status transfused     Product Code E0336V00     Unit Blood Type O POS     Expiration Date  [id number]    Whole Blood Glucose POCT [[id number]]  (Normal) Collected: 07/25/22 1757  Specimen: Blood, Capillary Updated: 07/25/22 1800     Whole Blood Glucose POCT 97 mg/dL     Culture, Sputum and Lower Respiratory [[id number]]  (Abnormal) Collected: 07/04/22 1034    Specimen: Sputum, Suctioned Updated: 07/25/22 1746     Culture Respiratory Heavy growth of Mixed upper respiratory flora      Light growth of Gram negative rod     Gram Stain Few WBCs      Rare Squamous epithelial cells      Few Mixed respiratory flora    Type and Screen [[id number]] Collected: 07/25/22 1549    Specimen: Blood, Venous Updated: 07/25/22 1641     ABO Rh O Pos     Antibody Screen Negative     Type And Screen Expiration 07/28/2022 23:59    CBC without Differential [[id number]]  (Abnormal) Collected: 07/25/22 1421    Specimen: Blood, Venous Updated: 07/25/22 1436     WBC 12.91 x10 3/uL      Hemoglobin 6.7 g/dL      Hematocrit 08.6 %      Platelet Count 405 x10 3/uL      MPV 9.5 fL      RBC 2.26 x10 6/uL      MCV 92.9 fL      MCH 29.6 pg      MCHC 31.9 g/dL      RDW 16 %      nRBC % 0.0 /100 WBC      Absolute nRBC 0.00 x10 3/uL     Whole Blood Glucose POCT [[id number]]  (Abnormal) Collected: 07/25/22 1151    Specimen: Blood, Capillary Updated: 07/25/22 1230     Whole Blood Glucose POCT 108 mg/dL     Hemoglobin and Hematocrit [[id number]]  (Abnormal) Collected: 07/25/22 0705    Specimen: Blood, Venous Updated: 07/25/22 0748     Hemoglobin 6.1 g/dL      Hematocrit 01.0 %     Whole Blood Glucose POCT [[id number]]  (Abnormal) Collected: 07/25/22 0544    Specimen: Blood, Capillary Updated: 07/25/22 0548     Whole Blood Glucose POCT 166 mg/dL     Basic Metabolic Panel [[id number]]  (Abnormal) Collected: 07/25/22 0351    Specimen: Blood, Venous Updated: 07/25/22 0420     Glucose 163 mg/dL      BUN 29 mg/dL      Creatinine 2.5 mg/dL      Calcium 8.0 mg/dL      Sodium 638 mEq/L      Potassium 3.7 mEq/L      Chloride 100 mEq/L      CO2 23 mEq/L       Anion Gap 14.0     GFR 18.4 mL/min/1.73 m2     Magnesium [[id number]]  (Normal) Collected: 07/25/22 0351    Specimen: Blood, Venous Updated: 07/25/22 0420     Magnesium 1.8 mg/dL     Phosphorus [[id number]]  (Normal) Collected: 07/25/22 0351    Specimen: Blood, Venous Updated: 07/25/22 0420     Phosphorus 2.3 mg/dL     CBC without Differential [[id number]]  (Abnormal) Collected: 07/25/22 0351    Specimen: Blood, Venous Updated: 07/25/22 0402     WBC 13.33 x10 3/uL      Hemoglobin 6.9 g/dL      Hematocrit 23.5 %      Platelet Count 446 x10 3/uL      MPV 10.1 fL      RBC 2.24 x10 6/uL      MCV 93.3 fL  MCH 30.8 pg      MCHC 33.0 g/dL      RDW 16 %      nRBC % 0.0 /100 WBC      Absolute nRBC 0.00 x10 3/uL     Whole Blood Glucose POCT [[id number]]  (Abnormal) Collected: 07/25/22 0035    Specimen: Blood, Capillary Updated: 07/25/22 0041     Whole Blood Glucose POCT 159 mg/dL             Rads:     Results for orders placed or performed during the hospital encounter of 06/28/22   CT Head WO Contrast    Narrative    HISTORY: Altered mental status.     COMPARISON: Brain MRI 09/20/2008.    TECHNIQUE: CT of the head performed without intravenous contrast. The  following dose reduction techniques were utilized: automated exposure  control and/or adjustment of the mA and/or KV according to patient size,  and the use of an iterative reconstruction technique.    CONTRAST: None.    FINDINGS:  The ventricles and sulcal pattern are within normal limits for the  patient's stated age. There is mild-moderate supratentorial chronic  ischemic disease. No acute infarct is noted. No intracranial hemorrhage is  seen. There is minimal paranasal sinus disease. There is moderate right  mastoid opacification. 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      Impression       1.No acute intracranial abnormality is seen.    Leandro Reasoner, MD  07/06/2022 3:56 AM         Warrene Kapfer Gypsy Balsam, MD  [facility]  Available on Epic chat 7a to 7p

## 2022-07-25 NOTE — Progress Notes (Signed)
Infectious Diseases & Tropical Medicine  Progress Note    07/25/2022   Shelby Barron VQQ:[id number] is a 82 y.o. female,       Assessment:     Encephalopathy  Acute CVA  History of bladder cancer and renal failure  Status post radical cystectomy/bilateral nephrectomy, enterectomy and lysis of adhesion (06/28/2022)  S/p small bowel resection  CT scan of abdomen and pelvis-no abscess or acute inflammatory process (07/19/2022)  Anemia improving post blood transfusion  Leukocytosis improving-likely reactive  End-stage renal disease on hemodialysis  Atrial fibrillation  Risk of aspiration    Plan:     Continue monitoring without antibiotics  Monitor leukocyte counts  Continue local wound care  Follow-up chest x-ray  Continue probiotics  Monitor electrolytes and renal functions closely  Monitor clinically  Discussed with patient's daughter in detail    ROS:     General: No fever, comfortable, sleepy, daughter at bedside  HEENT: NG tube in place  Respiratory: No cough or shortness of breath  Gastrointestinal: Abdominal wall wound/wound VAC in place  Genito-Urinary: no hematuria  Musculoskeletal: no edema  Neurological: Sleepy  Dermatological: No rash    Physical Examination:     Blood pressure 128/63, pulse 71, temperature 98.9 F (37.2 C), temperature source Axillary, resp. rate 16, height 1.575 m (5\' 2" ), weight 86.9 kg (191 lb 9.3 oz), SpO2 97%.    General Appearance: Comfortable  HEENT: Pupils are equal, round, and reactive to light.   Lungs: Clear to auscultation  Heart: Regular rate and rhythm  Chest: Symmetric chest wall expansion.   Abdomen: soft, no hepatosplenomegaly, no bowel sounds, abdominal wall wound   Neurological: Sleepy  Extremities: No edema    Laboratory And Diagnostic Studies:     Recent Labs     07/25/22  0705 07/25/22  0351 07/24/22  1129   WBC  --  13.33* 15.15*   Hemoglobin 6.1* 6.9* 7.5*   Hematocrit 19.4* 20.9* 23.0*   Platelet Count  --  446* 444*     Recent Labs     07/25/22  0351  07/24/22  1129   Sodium 137 140   Potassium 3.7 3.7   Chloride 100 102   CO2 23 26   BUN 29* 21   Creatinine 2.5* 2.0*   Glucose 163* 99   Calcium 8.0 8.4     No results for input(s): "AST", "ALT", "ALKPHOS", "PROT", "ALB" in the last 72 hours.      Current Meds:      Scheduled Meds: PRN Meds:    acetaminophen, 650 mg, Oral, 4 times per day   Or  acetaminophen, 650 mg, Oral, 4 times per day  amiodarone, 200 mg, per NG tube, Daily  apixaban, 2.5 mg, Oral, Q12H SCH  carvedilol, 12.5 mg, Oral, Q12H SCH  darbepoetin alfa, 0.45 mcg/kg, Subcutaneous, Weekly  glycopyrrolate, 0.2 mg, Intravenous, Q8H  insulin glargine, 7 Units, Subcutaneous, QAM  insulin lispro, 1-8 Units, Subcutaneous, Q6H  pantoprazole, 40 mg, per NG tube, Q12H SCH  thiamine, 100 mg, per NG tube, Daily        Continuous Infusions:     acetaminophen, 650 mg, Q6H PRN  albuterol-ipratropium, 3 mL, Q6H PRN  benzocaine, 1 spray, TID PRN  dextrose, 15 g of glucose, PRN   Or  dextrose, 12.5 g, PRN   Or  dextrose, 12.5 g, PRN   Or  glucagon (rDNA), 1 mg, PRN  diphenhydrAMINE, 25 mg, Q6H PRN  hydrALAZINE, 10 mg, Q3H PRN  HYDROmorphone,  0.4 mg, Once PRN  labetalol, 10 mg, Q15 Min PRN  lidocaine 2% jelly, , Once PRN  naloxone, 0.2 mg, PRN  oxyCODONE, 2.5 mg, Q4H PRN  phenol, 1 spray, Q2H PRN          Jef Futch A. Janalyn Rouse, M.D.  07/25/2022  10:08 AM

## 2022-07-25 NOTE — Progress Notes (Signed)
[facility]  PROGRESS NOTE      Patient: Shelby Barron  Date:07/08/2022   LOS: 27 Days  Admission Date: 06/28/2022   MRN: [id number]  Attending: Lennox Pippins, MD  When on service as the attending, please contact me on Epic Secure Chat from 7 AM - 7 PM for non-urgent issues. For urgent matters use XTend page from 7 AM - 7 PM.       ASSESSMENT/PLAN     Shelby Barron is a 82 y.o. female admitted with Bladder cancer    Interval Summary: [name], [name], type 2 diabetes, renal cell carcinoma status post partial bilateral nephrectomy, [name], new diagnosis of urothelial cancer initally presented on 6/10 for elective cystectomy and completion nephrectomy, [name], [name], extubated and off pressors since 6/13.     She is hypoactive and delirious. Surgery recommended NGT and start TF.  Has [name] T/Th/Sat. Nephrology following     Developed new onset afib RVR during HD. Stopped norvasc switched to metoprolol and gave additional IV metoprolol. Consulted cardiology. Discussed with ICU requested a consult given deterioration. Recommended repeat blood cultures and broadening antibiotics to meropenem. Palliative care consulted.    No further episodes of Afib RVR now on amiodarone gtt and PO metoprolol. Hgb stable. Repeating CXR, clear.     Advancing diet and weaning tube feeds. Stopped aspirin and started eliquis on 7/5    Anticipated Discharge: Greater Than 48 Hours    Barriers to Discharge: Clinical improvement    Family/Social/Case Mgr Assistance: SNF pending weaning off Tfs & NGT removal  Patient Active Hospital Problem List:    #Toxic metabolic Encephalopathy/delirium  Patient was upgraded to ICU on 6/30 for lethargy and decreased responsiveness  CT head was repeated on 6/29, no acute finding  ABG consistent with respiratory alkalosis  EEG consistent with encephalopathy no  seizure, MRI no acute stroke  Culture pending  CT abdomen with and without contrast after premedication did not show any leak or abscess  Continue supportive care  - SLP advancing diet slowly. Switched to nocturnal tube feeds and will wean as able.  - Palliative consulted:  Family is clear they would like no limits to care and patient should remain full code.    #Anemia, 2/2 CKD and blood loss during OR  - TSAT 18%  - continue Aranesp per nephrology  - Trend CBC, transfuse Hgb<7.   - Ordered for 1U pRBC for Hgb 6.1 on 7/7. Hold eliquis pending CTH, no overt s/s of bleeding at this time. Possibly iso CKD    #s/p open cystectomy, bilateral nephrectomies, and resection of small bowel after intra-op enterotomy    #Mixed shock - hemorrhagic vs septic: Resolved  #Leukocytosis  - 6/10 small bowel enteretomy with 1L EBL. Went to the OR on June 12 for small bowel anastomosis, closure of mesenteric defect, closure of abdominal wall with wound VAC application. Wound VAC last changed on 7/5  - Surgery consulted, recommend CTAP with PO contrast that showed no anastomotic leak. Low concern for abscesses as they'd expect these to have a more loculated appearance in the interim between 6/17 and 7/1 imaging  - ID consulted, completed meropenem and Mycamine for 2 weeks on 7/3.     #Acute CVA on the right frontal lobe  #History of CVA  - Hold home plavix  -  Left upper extremity not moving at all unclear duration  - CT head showed right frontal infarction  - Neurology following initially started on ASA 81mg  only due to concern for risk of hemorrhagic conversion. Now > 10 days out. OK to switch to Eliquis for Afib and discontinue aspirin per neurology on 7/5. Held pending anemia workup as above.  - SBP goal 120-160  - Carotid Doppler showed plaque with no critical stenosis  - Repeat head CT done on 6/29 for lethargy showed no acute finding  - EEG encephalopathic pattern no seizure, repeat MRI no new infarction  - PT OT recommend  SNF    #Acute hypoxemic respiratory failure resolved  #COPD  Extubated on 6/13, currently on room air  Maintain SaO2 > 92%  Continue as needed neb treatment  X-ray repeated on 6/29 showed resolution of interstitial edema    #ESRD  S/p CRRT this admission, now on HD TTS  Continue dialysis per nephrology  IR removed the nonfunctional permacath and right [place] IJ cath and replaced with a new permacath on 6/26.    #HTN  On metoprolol   PRN IV labetalol for SBP > 180     New onset Afib with RVR, improved  - CHA2DS2-VASc score is 7  - Status post amiodarone drip. Now stable on oral amiodarone and coreg per cardiology recommendations  - GI and general surgery cleared for anticoagulation. Neurology recommended to hold anticoagulation initiation for 10 days given the size of stroke. Now started on Eliquis since 07/23/22      #Renal cell carcinoma outpatient follow-up hematology oncology  #Urothelial carcinoma  #Thrombocytopenia  - VTE ppx: SCDs, subcutaneous heparin  --     #Diabetes Mellitus type 2  -Sliding scale insulin          Nutrition: Tube feeds  Malnutrition Documentation    Moderate Malnutrition related to inadequate nutritional intake in the setting of acute illness as evidenced by <50% EER > 5 days, mild muscle losses (temporalis, deltoid) - new                 Patient has BMI=Body mass index is 35.04 kg/m.  Diagnosis: Obesity based on BMI criteria                                                               Recent Labs   Lab 07/25/22  0705 07/25/22  0351 07/24/22  1129 07/23/22  0242   Hemoglobin 6.1* 6.9* 7.5* 7.6*   Hematocrit 19.4* 20.9* 23.0* 23.3*   MCV  --  93.3 91.6 92.1   WBC  --  13.33* 15.15* 15.06*   Platelet Count  --  446* 444* 418*     Recent Labs   Lab 07/18/22  0421 06/02/22  0514   Ferritin 2,300.21*  --    Iron 18* 62   TIBC 99* 145*   Iron Saturation 18 43       Code Status: Full Code    Dispo: pending improved mental status    Family Contact: Updated daughter    DVT Prophylaxis:   Current  Facility-Administered Medications (Includes Only Anticoagulants, Misc. Hematological)   Medication Dose Route Last Admin    apixaban (ELIQUIS) tablet 2.5 mg  2.5 mg Oral 2.5 mg at 07/25/22 0900  CHART  REVIEW & DISCUSSION     The following chart items were reviewed as of 9:42 AM on 07/25/22:  [x]  Lab Results [x]  Imaging Results   [x]  Problem List  [x]  Current Orders [x]  Current Medications  [x]  Allergies  []  Code Status []  Previous Notes   []  SDoH    The management and plan of care for this patient was discussed with the following specialty consultants:  [x]  Cardiology  [x]  Gastroenterology                 []  Infectious Disease  []  Pulmonology [x]  Neurology                [x]  Nephrology  []  Neurosurgery []  Orthopedic Surgery  [x]  ICU []  General Surgery []  Psychiatry                                   [x]  Palliative    SUBJECTIVE     Shelby Barron   Unable to carry on a coherent conversation, unchanged mental status today     MEDICATIONS     Current Facility-Administered Medications   Medication Dose Route Frequency    acetaminophen  650 mg Oral 4 times per day    Or    acetaminophen  650 mg Oral 4 times per day    amiodarone  200 mg per NG tube Daily    apixaban  2.5 mg Oral Q12H SCH    carvedilol  12.5 mg Oral Q12H SCH    darbepoetin alfa  0.45 mcg/kg Subcutaneous Weekly    glycopyrrolate  0.2 mg Intravenous Q8H    insulin glargine  7 Units Subcutaneous QAM    insulin lispro  1-8 Units Subcutaneous Q6H    pantoprazole  40 mg per NG tube Q12H [facility]    thiamine  100 mg per NG tube Daily       PHYSICAL EXAM     Vitals:    07/25/22 0800   BP: 128/63   Pulse: 71   Resp: 16   Temp: 98.9 F (37.2 C)   SpO2: 97%       Temperature: Temp  Min: 97.8 F (36.6 C)  Max: 99.3 F (37.4 C)  Pulse: Pulse  Min: 61  Max: 82  Respiratory: Resp  Min: 16  Max: 39  Non-Invasive BP: BP  Min: 110/58  Max: 199/78  Pulse Oximetry SpO2  Min: 96 %  Max: 100 %    Intake and Output Summary (Last 24 hours) at Date Time    Intake/Output  Summary (Last 24 hours) at 07/25/2022 0942  Last data filed at 07/25/2022 0400  Gross per 24 hour   Intake 740 ml   Output 2100 ml   Net -1360 ml           GEN APPEARANCE: Sleeping but arousable oriented x 1  HEENT: PERLA; EOMI; Conjunctiva Clear  NECK: Supple; No bruits  CVS: RRR, S1, S2; No M/G/R  LUNGS: CTAB; wheezing improved, no Rhonchi: No rales  ABD: Soft; No TTP; + Normoactive BS  EXT: No edema; Pulses 2+ and intact  NEURO: moving right upper extremity spontaneously, able to move lower extremities for pain but minimal, no movement at all on the left upper extremity  MENTAL STATUS: Lethargic, oriented x 1, able to follow simple commands    LABS     Recent Labs   Lab 07/25/22  0705  07/25/22  0351 07/24/22  1129 07/23/22  0242   WBC  --  13.33* 15.15* 15.06*   RBC  --  2.24* 2.51* 2.53*   Hemoglobin 6.1* 6.9* 7.5* 7.6*   Hematocrit 19.4* 20.9* 23.0* 23.3*   MCV  --  93.3 91.6 92.1   Platelet Count  --  446* 444* 418*       Recent Labs   Lab 07/25/22  0351 07/24/22  1129 07/23/22  0242 07/22/22  0219 07/21/22  0220   Sodium 137 140 139 136 137   Potassium 3.7 3.7 3.9 3.9 3.4*   Chloride 100 102 103 101 101   CO2 23 26 24 21 24    BUN 29* 21 25* 43* 29*   Creatinine 2.5* 2.0* 2.7* 3.9* 2.9*   Glucose 163* 99 141* 151* 135*   Calcium 8.0 8.4 8.8 8.8 8.4   Magnesium 1.8 1.8 1.8 1.8 1.8       Recent Labs   Lab 07/20/22  0414   ALT 9   AST (SGOT) 10   Bilirubin, Total 0.2   Albumin 1.6*   Alkaline Phosphatase 97                       Microbiology Results (last 15 days)       Procedure Component Value Units Date/Time    Culture, Blood, Aerobic And Anaerobic [[id number]] Collected: 07/18/22 1400    Order Status: Completed Specimen: Blood, Venous Updated: 07/23/22 2200     Culture Blood No growth at 5 days    Culture, Blood, Aerobic And Anaerobic [[id number]] Collected: 07/18/22 1400    Order Status: Completed Specimen: Blood, Venous Updated: 07/23/22 2200     Culture Blood No growth at 5 days             RADIOLOGY     CT  Abdomen Pelvis W IV And PO Cont    Result Date: 07/19/2022   1.Slight interval increase in left-sided retroperitoneal fluid collection with the other abdominal fluid collections appearing relatively stable. 2. There is no definite abscess or acute inflammatory process. 3. Incidental note is made of a small paraesophageal type hernia. 4. Sigmoid diverticulosis is noted without CT evidence of diverticulitis. 5. Other findings as noted above. Miguel Dibble, MD 07/19/2022 6:36 AM    MRI Brain WO Contrast    Result Date: 07/18/2022   Interval evolution of multifocal recent infarcts in the bilateral cerebral hemispheres. No new infarct. Milan Waunita Schooner, MD 07/18/2022 5:57 PM    XR Chest AP Portable    Result Date: 07/17/2022  1.Near-complete resolution of interstitial edema. 2.Improving pulmonary vascular congestion. Legrand Pitts, MD 07/17/2022 2:38 PM    CT Head WO Contrast    Result Date: 07/17/2022   1.No new acute intracranial pathology. 2.There are multiple regions of cytotoxic edema in the right > left frontal and left > right occipital lobes, consistent with known early subacute infarcts. Dannielle Burn 07/17/2022 8:26 AM    US Carotid Duplex Dopp Comp Bilateral    Result Date: 07/15/2022   1. Plaque formation in the right internal carotid artery bulb with elevated velocities. Stenosis is estimated at 50-69%. 2. Plaque in the left internal carotid artery with normal velocities. Any stenosis is less than 50%. Criteria for stenosis and velocity parameters are based on the guidelines of the SRU (Society of Radiologists in Ultrasound). Grant et al: Carotid Artery Stenosis: Grayscale and Doppler Ultrasound Diagnosis-- Society of Radiologists in Ultrasound  Consensus Conference Ultrasound Quarterly. Volume 19, Number 4; December, 2003. Recommendations updated October 2021 Verne Carrow is an [facility] accredited facility Suszanne Finch, MD 07/15/2022 9:41 AM    Tunneled Cath Removal (Permcath)    Result Date: 07/14/2022  1. Nonfunctioning  tunneled dialysis catheter as described above. 2. Successful placement of a new 19 cm Equistream catheter as described above. Dimitrios Papadouris, MD 07/14/2022 4:06 PM    Tunneled Cath Placement (Permcath)    Result Date: 07/14/2022  1. Nonfunctioning tunneled dialysis catheter as described above. 2. Successful placement of a new 19 cm Equistream catheter as described above. Dimitrios Papadouris, MD 07/14/2022 4:06 PM    MRI Brain WO Contrast    Result Date: 07/13/2022   1.Patchy areas of acute/recent infarction involving the cerebral hemispheres bilaterally. The infarcts have a configuration suggesting watershed territory infarcts. 2.Chronic ischemic changes as discussed. Georgann Housekeeper, MD 07/13/2022 10:28 PM    CT Head WO Contrast    Result Date: 07/13/2022   1.New region of loss of gray-white matter differentiation in the right frontal lobe, concerning for acute infarction. 2.A few additional areas of poor gray-white matter differentiation for example in the left occipital lobe may be artifactual but could be correlated with MRI. 3.No evidence of acute intracranial hemorrhage or significant mass effect. These urgent results were discussed with and acknowledged by Dr. Don Broach on 07/13/2022 3:20 PM. Vara Guardian, MD 07/13/2022 3:21 PM    XR Chest AP Portable    Result Date: 07/10/2022  Interstitial markings have slightly increased, suggesting minimal edema. No other change. Wilmon Pali, MD 07/10/2022 4:49 PM    XR Chest AP Portable    Result Date: 07/07/2022   There is mild interstitial prominence suggesting mild congestive heart failure/fluid overload in the setting of shortness of breath [name], MD 07/07/2022 12:32 PM    XR Chest AP Portable    Result Date: 07/06/2022   Hypoventilation with basilar atelectasis. Charlott Rakes, MD 07/06/2022 3:35 PM    CT Abdomen Pelvis W IV And PO Cont    Result Date: 07/06/2022  1.Postoperative changes from bilateral nephrectomy and cystectomy. 2.Small amount of ascites including some  areas which appear loculated. Small collections of fluid and gas in the anterior midline abdomen may be postoperative but superimposed infection is not excluded. No rim-enhancing fluid collections or extravasation of oral contrast to suggest anastomotic leak. Demetrios Isaacs, MD 07/06/2022 5:51 AM    CT Head WO Contrast    Result Date: 07/06/2022   1.No acute intracranial abnormality is seen. Leandro Reasoner, MD 07/06/2022 3:56 AM    XR Abdomen AP    Result Date: 07/05/2022  Enteric tube terminates in the distal stomach. Elease Etienne, DO 07/05/2022 12:24 PM    XR Chest AP Portable    Result Date: 06/30/2022  1. Lines and tubes in adequate position. No pneumothorax. 2. Trace left pleural effusion and bibasilar atelectasis. Jasmine December D'Heureux, MD 06/30/2022 4:53 PM    XR Chest AP Portable    Result Date: 06/30/2022  Catheters as described. Pankaj Dominica, MD 06/30/2022 2:07 PM    XR Chest AP Portable    Result Date: 06/28/2022   Right mainstem bronchial intubation. The tube should be withdrawn about 4 cm. Dr. Tildon Husky is aware. Wynema Birch, MD 06/28/2022 4:56 PM    XR OR MISCOUNT    Result Date: 06/28/2022   No unexpected foreign body. These critical results were discussed with and acknowledged by Angie R., the circulating nurse, who will communicate the  findings to Everardo All, MD on 06/28/2022 3:02 PM. Bosie Helper, MD 06/28/2022 3:03 PM   Echo Results       None          No results found for this or any previous visit.    SignedLennox Pippins, MD  9:42 AM 07/25/2022

## 2022-07-25 NOTE — Progress Notes (Signed)
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 s/p bilateral radical nephrectomies and  cystectomy on 06/28/22  - complicated by small bowel enterotomy and blood loss   New onset afib - on amiodarone  High grade urothelial bladder CA: s/p bilateral nephrectomies and cystectomy c/b mall bowel enterotomy and blood loss (~1L) on 06/28/22  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 - remains low, adequate iron - on aranesp  Encephalopathy - better   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   Hypophosphatemia - persists     Recommendations:   Tolerated dialysis well  Received aranesp yesterday   Plan for next HD on Tuesday     Case discussed with: patient  Delice Bison, MD  [facility]  [phone] (office)  [phone] [facility] Spectra-Link)      Subjective: resting      Review of Systems:   Resting     Physical Exam:     Vitals:    07/24/22 2000 07/24/22 2153 07/25/22 0000 07/25/22 0400   BP: 131/60 131/60 136/61 137/63   Pulse: 82 75 75 74   Resp: (!) 37  (!) 23 (!) 39   Temp: 99 F (37.2 C)  98.6 F (37 C) 98 F (36.7 C)   TempSrc: Axillary  Axillary Axillary   SpO2: 98%  96% 97%   Weight:    86.9 kg (191 lb 9.3 oz)   Height:           Intake and Output Summary (Last 24 hours) at Date Time    Intake/Output Summary (Last 24 hours) at 07/25/2022 0702  Last data filed at 07/25/2022 0400  Gross per 24 hour   Intake 740 ml   Output 2100 ml   Net -1360 ml         General: resting  Cardiovascular:  regular rate and rhythm  Lungs: bilateral air entry  Abdomen: soft  Extremities: trace edema  Access: R permacath     Meds:      Scheduled Meds: PRN Meds:    acetaminophen, 650 mg, Oral, 4 times per day   Or  acetaminophen, 650 mg, Oral, 4 times per day  amiodarone, 200 mg, per NG tube, Daily  apixaban, 2.5 mg, Oral, Q12H SCH  carvedilol, 12.5 mg, Oral, Q12H SCH  darbepoetin alfa, 0.45 mcg/kg, Subcutaneous, Weekly  glycopyrrolate, 0.2 mg, Intravenous, Q8H  insulin glargine, 7 Units, Subcutaneous, QAM  insulin lispro, 1-8 Units, Subcutaneous, Q6H  pantoprazole, 40 mg, per NG tube, Q12H SCH  thiamine, 100 mg, per NG tube, Daily          Continuous Infusions:       acetaminophen, 650 mg, Q6H PRN  albuterol-ipratropium, 3 mL, Q6H PRN  benzocaine, 1 spray, TID PRN  dextrose, 15 g of glucose, PRN   Or  dextrose, 12.5 g, PRN   Or  dextrose, 12.5 g, PRN   Or  glucagon (  rDNA), 1 mg, PRN  diphenhydrAMINE, 25 mg, Q6H PRN  hydrALAZINE, 10 mg, Q3H PRN  HYDROmorphone, 0.4 mg, Once PRN  labetalol, 10 mg, Q15 Min PRN  lidocaine 2% jelly, , Once PRN  naloxone, 0.2 mg, PRN  oxyCODONE, 2.5 mg, Q4H PRN  phenol, 1 spray, Q2H PRN              Labs:     Recent Labs   Lab 07/25/22  0351 07/24/22  1129 07/23/22  0242   WBC 13.33* 15.15* 15.06*   Hemoglobin 6.9* 7.5* 7.6*   Hematocrit 20.9* 23.0* 23.3*   Platelet Count 446* 444* 418*     Recent Labs   Lab 07/25/22  0351 07/24/22  1129 07/23/22  0242 07/21/22  0220 07/20/22  0414   Sodium 137 140 139  More results in Results Review 135   Potassium 3.7 3.7 3.9  More results in Results Review 4.2   Chloride 100 102 103  More results in Results Review 100   CO2 23 26 24   More results in Results Review 22   BUN 29* 21 25*  More results in Results Review 57*   Creatinine 2.5* 2.0* 2.7*  More results in Results Review 4.8*   Calcium 8.0 8.4 8.8  More results in Results Review 9.0   Albumin  --   --   --   --  1.6*   Phosphorus 2.3 1.8* 2.6  More results in Results Review 4.2   Magnesium 1.8 1.8 1.8   More results in Results Review 2.0   Glucose 163* 99 141*  More results in Results Review 79   GFR 18.4* 24.0* 16.8*  More results in Results Review 8.4*   More results in Results Review = values in this interval not displayed.           Signed by: Delice Bison, MD, FASN

## 2022-07-25 NOTE — Plan of Care (Signed)
Problem: Moderate/High Fall Risk Score >5  Goal: Patient will remain free of falls  Outcome: Progressing  Flowsheets (Taken 07/25/2022 1000)  High (Greater than 13):   HIGH-Visual cue at entrance to patient's room   HIGH-Bed alarm on at all times while patient in bed   HIGH-Utilize chair pad alarm for patient while in the chair   HIGH-Apply yellow "Fall Risk" arm band   HIGH-Initiate use of floor mats as appropriate     Problem: Compromised Sensory Perception  Goal: Sensory Perception Interventions  Outcome: Progressing  Flowsheets (Taken 07/25/2022 0400 by Mar Daring, RN)  Sensory Perception Interventions: Offload heels, Pad bony prominences, Reposition q 2hrs/turn Clock, Q2 hour skin assessment under devices if present     Problem: Compromised Moisture  Goal: Moisture level Interventions  Outcome: Progressing  Flowsheets (Taken 07/25/2022 0400 by Mar Daring, RN)  Moisture level Interventions: Moisture wicking products, Moisture barrier cream     Problem: Compromised Activity/Mobility  Goal: Activity/Mobility Interventions  Outcome: Progressing  Flowsheets (Taken 07/25/2022 0400 by Mar Daring, RN)  Activity/Mobility Interventions: Pad bony prominences, TAP Seated positioning system when OOB, Promote PMP, Reposition q 2 hrs / turn clock, Offload heels     Problem: Compromised Nutrition  Goal: Nutrition Interventions  Outcome: Progressing  Flowsheets (Taken 07/25/2022 0400 by Mar Daring, RN)  Nutrition Interventions: Discuss nutrition at Rounds, I&Os, Document % meal eaten, Daily weights     Problem: Compromised Friction/Shear  Goal: Friction and Shear Interventions  Outcome: Progressing  Flowsheets (Taken 07/25/2022 0400 by Mar Daring, RN)  Friction and Shear Interventions: Pad bony prominences, Off load heels, HOB 30 degrees or less unless contraindicated, Consider: TAP seated positioning, Heel foams     Problem: Inadequate Airway Clearance  Goal: Patent Airway maintained  Outcome:  Progressing  Flowsheets (Taken 07/21/2022 1947 by Natale Lay, RN)  Patent airway maintained:   Provide adequate fluid intake to liquefy secretions   Reposition patient every 2 hours and as needed unless able to self-reposition   Suction secretions as needed  Goal: Normal respiratory rate/effort achieved/maintained  Outcome: Progressing  Flowsheets (Taken 07/24/2022 1549 by Peagler, Fannie Knee, RN)  Normal respiratory rate/effort achieved/maintained: Plan activities to conserve energy: plan rest periods     Problem: Impaired Mobility  Goal: Mobility/Activity is maintained at optimal level for patient  Outcome: Progressing  Flowsheets (Taken 07/24/2022 1549 by Peagler, Fannie Knee, RN)  Mobility/activity is maintained at optimal level for patient:   Encourage independent activity per ability   Consult/collaborate with Physical Therapy and/or Occupational Therapy     Problem: Constipation  Goal: Fluid and electrolyte balance are achieved/maintained  Outcome: Progressing  Flowsheets (Taken 07/22/2022 1136 by Rogelia Rohrer, RN)  Fluid and electrolyte balance are achieved/maintained:   Monitor/assess lab values and report abnormal values   Assess and reassess fluid and electrolyte status   Observe for cardiac arrhythmias   Monitor for muscle weakness     Problem: Safety  Goal: Patient will be free from injury during hospitalization  Outcome: Progressing  Flowsheets (Taken 07/25/2022 0434 by Mar Daring, RN)  Patient will be free from injury during hospitalization:   Assess patient's risk for falls and implement fall prevention plan of care per policy   Provide and maintain safe environment   Use appropriate transfer methods   Ensure appropriate safety devices are available at the bedside   Include patient/ family/ care giver in decisions related to safety   Hourly rounding   Assess for patients  risk for elopement and implement Elopement Risk Plan per policy   Provide alternative method of communication if needed (communication  boards, writing)  Goal: Patient will be free from infection during hospitalization  Outcome: Progressing  Flowsheets (Taken 07/17/2022 2155 by Creig Hines, RN)  Free from Infection during hospitalization:   Assess and monitor for signs and symptoms of infection   Monitor lab/diagnostic results     Problem: Pain  Goal: Pain at adequate level as identified by patient  Outcome: Progressing  Flowsheets (Taken 07/21/2022 1947 by Natale Lay, RN)  Pain at adequate level as identified by patient:   Identify patient comfort function goal   Assess for risk of opioid induced respiratory depression, including snoring/sleep apnea. Alert healthcare team of risk factors identified.   Reassess pain within 30-60 minutes of any procedure/intervention, per Pain Assessment, Intervention, Reassessment (AIR) Cycle   Evaluate patient's satisfaction with pain management progress   Consult/collaborate with Physical Therapy, Occupational Therapy, and/or Speech Therapy   Include patient/patient care companion in decisions related to pain management as needed   Assess pain on admission, during daily assessment and/or before any "as needed" intervention(s)   Offer non-pharmacological pain management interventions   Consult/collaborate with Pain Service   Evaluate if patient comfort function goal is met     Problem: Hemodynamic Status: Cardiac  Goal: Stable vital signs and fluid balance  Outcome: Progressing  Flowsheets (Taken 07/24/2022 1549 by Peagler, Fannie Knee, RN)  Stable vital signs and fluid balance: Assess signs and symptoms associated with cardiac rhythm changes     Problem: Renal Instability  Goal: Fluid and electrolyte balance are achieved/maintained  Outcome: Progressing  Flowsheets (Taken 07/21/2022 1947 by Natale Lay, RN)  Fluid and electrolyte balance are achieved/maintained:   Monitor/assess lab values and report abnormal values   Assess and reassess fluid and electrolyte status   Observe for cardiac arrhythmias   Monitor for  muscle weakness   Follow fluid restrictions/IV/PO parameters     Problem: Patient Receiving Advanced Renal Therapies  Goal: Therapy access site remains intact  Outcome: Progressing  Flowsheets (Taken 07/24/2022 1549 by Peagler, Fannie Knee, RN)  Therapy access site remains intact: Assess therapy access site     Problem: Fluid and Electrolyte Imbalance/ Endocrine  Goal: Fluid and electrolyte balance are achieved/maintained  Outcome: Progressing  Flowsheets (Taken 07/22/2022 1136 by Rogelia Rohrer, RN)  Fluid and electrolyte balance are achieved/maintained:   Monitor/assess lab values and report abnormal values   Assess and reassess fluid and electrolyte status   Observe for cardiac arrhythmias   Monitor for muscle weakness  Goal: Adequate hydration  Outcome: Progressing  Flowsheets (Taken 07/16/2022 2329 by Creig Hines, RN)  Adequate hydration: Assess mucus membranes, skin color, turgor, perfusion and presence of edema     Problem: Inadequate Gas Exchange  Goal: Patent Airway maintained  Outcome: Progressing  Flowsheets (Taken 07/21/2022 1947 by Natale Lay, RN)  Patent airway maintained:   Provide adequate fluid intake to liquefy secretions   Reposition patient every 2 hours and as needed unless able to self-reposition   Suction secretions as needed     Problem: Neurological Deficit  Goal: Neurological status is stable or improving  Outcome: Progressing  Flowsheets (Taken 07/22/2022 2019 by Mar Daring, RN)  Neurological status is stable or improving:   Monitor/assess/document neurological assessment (Stroke: every 4 hours)   Monitor/assess NIH Stroke Scale   Re-assess NIH Stroke Scale for any change in status   Observe for seizure activity and initiate seizure precautions  if indicated   Perform CAM Assessment     Problem: Every Day - Stroke  Goal: Mobility/Activity is maintained at optimal level for patient  Outcome: Progressing  Flowsheets (Taken 07/24/2022 1549 by Peagler, Fannie Knee, RN)  Mobility/activity is maintained at  optimal level for patient:   Encourage independent activity per ability   Consult/collaborate with Physical Therapy and/or Occupational Therapy  Goal: Neurological status is stable or improving  Outcome: Progressing  Flowsheets (Taken 07/22/2022 2019 by Mar Daring, RN)  Neurological status is stable or improving:   Monitor/assess/document neurological assessment (Stroke: every 4 hours)   Monitor/assess NIH Stroke Scale   Re-assess NIH Stroke Scale for any change in status   Observe for seizure activity and initiate seizure precautions if indicated   Perform CAM Assessment  Goal: Stable vital signs and fluid balance  Outcome: Progressing  Flowsheets (Taken 07/21/2022 1947 by Natale Lay, RN)  Stable vital signs and fluid balance:   Position patient for maximum circulation/cardiac output   Monitor intake and output. Notify LIP if urine output is < 30 mL/hour.   Encourage oral fluid intake   Monitor and assess vitals every 4 hours or as ordered and hemodynamic parameters   Apply telemetry monitor as ordered  Goal: Patient will maintain adequate oxygenation  Outcome: Progressing  Flowsheets (Taken 07/22/2022 1136 by Rogelia Rohrer, RN)  Patient will maintain adequate oxygenation:   Suction secretions as needed   Maintain SpO2 of greater than 92%  Goal: Skin integrity is maintained or improved  Outcome: Progressing  Flowsheets (Taken 07/21/2022 1947 by Natale Lay, RN)  Skin integrity is maintained or improved:   Assess Braden Scale every shift   Turn or reposition patient every 2 hours or as needed unless able to reposition self   Keep skin clean and dry   Relieve pressure to bony prominences   Collaborate with Wound, Ostomy, and Continence Nurse   Avoid shearing   Utilize specialty bed   Keep head of bed 30 degrees or less (unless contraindicated)   Encourage use of lotion/moisturizer on skin   Monitor patient's hygiene practices   Increase activity as tolerated/progressive mobility  Goal: Neurovascular status  is stable or improving  Outcome: Progressing  Flowsheets (Taken 07/21/2022 1947 by Natale Lay, RN)  Neurovascular status is stable or improving:   Monitor/assess neurovascular status (pulses, capillary refill, pain, paresthesia, presence of edema)   Monitor/assess for signs of Venous Thrombus Emboli (edema of calf/thigh redness, pain)   Monitor/assess site of invasive procedure for signs of bleeding     Problem: Pain interferes with ability to perform ADL  Goal: Pain at adequate level as identified by patient  Outcome: Progressing  Flowsheets (Taken 07/21/2022 1947 by Natale Lay, RN)  Pain at adequate level as identified by patient:   Identify patient comfort function goal   Assess for risk of opioid induced respiratory depression, including snoring/sleep apnea. Alert healthcare team of risk factors identified.   Reassess pain within 30-60 minutes of any procedure/intervention, per Pain Assessment, Intervention, Reassessment (AIR) Cycle   Evaluate patient's satisfaction with pain management progress   Consult/collaborate with Physical Therapy, Occupational Therapy, and/or Speech Therapy   Include patient/patient care companion in decisions related to pain management as needed   Assess pain on admission, during daily assessment and/or before any "as needed" intervention(s)   Offer non-pharmacological pain management interventions   Consult/collaborate with Pain Service   Evaluate if patient comfort function goal is met

## 2022-07-26 ENCOUNTER — Inpatient Hospital Stay: Payer: No Typology Code available for payment source

## 2022-07-26 LAB — RED BLOOD CELLS OR HOLD
Expiration Date: 202407082359
Expiration Date: 202407082359
Expiration Date: 202407102359
Expiration Date: 202407112359
Product Status: TRANSFUSED
Product Status: TRANSFUSED
Product Status: TRANSFUSED
Unit Blood Type: O POS
Unit Blood Type: O POS
Unit Blood Type: O POS
Unit Blood Type: O POS

## 2022-07-26 LAB — CBC
Absolute nRBC: 0 10*3/uL (ref <=0.00)
Hematocrit: 26.6 % — ABNORMAL LOW (ref 34.7–43.7)
Hemoglobin: 8.7 g/dL — ABNORMAL LOW (ref 11.4–14.8)
MCH: 29.5 pg (ref 25.1–33.5)
MCHC: 32.7 g/dL (ref 31.5–35.8)
MCV: 90.2 fL (ref 78.0–96.0)
MPV: 9.5 fL (ref 8.9–12.5)
Platelet Count: 445 10*3/uL — ABNORMAL HIGH (ref 142–346)
RBC: 2.95 10*6/uL — ABNORMAL LOW (ref 3.90–5.10)
RDW: 17 % — ABNORMAL HIGH (ref 11–15)
WBC: 13.02 10*3/uL — ABNORMAL HIGH (ref 3.10–9.50)
nRBC %: 0 /100 WBC (ref <=0.0)

## 2022-07-26 LAB — BASIC METABOLIC PANEL
Anion Gap: 16 — ABNORMAL HIGH (ref 5.0–15.0)
BUN: 45 mg/dL — ABNORMAL HIGH (ref 7–21)
CO2: 23 mEq/L (ref 17–29)
Calcium: 8.7 mg/dL (ref 7.9–10.2)
Chloride: 99 mEq/L (ref 99–111)
Creatinine: 3.8 mg/dL — ABNORMAL HIGH (ref 0.4–1.0)
GFR: 11.1 mL/min/{1.73_m2} — ABNORMAL LOW (ref >=60.0)
Glucose: 170 mg/dL — ABNORMAL HIGH (ref 70–100)
Potassium: 4 mEq/L (ref 3.5–5.3)
Sodium: 138 mEq/L (ref 135–145)

## 2022-07-26 LAB — MAGNESIUM: Magnesium: 2 mg/dL (ref 1.6–2.6)

## 2022-07-26 LAB — CROSSMATCH PRBC, 1 UNIT
Expiration Date: 202407152359
Unit Blood Type: O POS

## 2022-07-26 LAB — PHOSPHORUS: Phosphorus: 2.9 mg/dL (ref 2.3–4.7)

## 2022-07-26 LAB — WHOLE BLOOD GLUCOSE POCT
Whole Blood Glucose POCT: 155 mg/dL — ABNORMAL HIGH (ref 70–100)
Whole Blood Glucose POCT: 199 mg/dL — ABNORMAL HIGH (ref 70–100)
Whole Blood Glucose POCT: 135 mg/dL — ABNORMAL HIGH (ref 70–100)

## 2022-07-26 MED ORDER — APIXABAN 2.5 MG PO TABS
2.5000 mg | ORAL_TABLET | Freq: Two times a day (BID) | ORAL | Status: DC
Start: 2022-07-26 — End: 2022-07-31
  Administered 2022-07-26 – 2022-07-28 (×4): 2.5 mg via ORAL
  Filled 2022-07-26 (×6): qty 1

## 2022-07-26 MED ORDER — ONDANSETRON HCL 4 MG/2ML IJ SOLN
4.0000 mg | Freq: Four times a day (QID) | INTRAMUSCULAR | Status: DC | PRN
Start: 2022-07-26 — End: 2022-08-18
  Administered 2022-07-26 – 2022-08-15 (×4): 4 mg via INTRAVENOUS
  Filled 2022-07-26 (×4): qty 2

## 2022-07-26 MED ORDER — APIXABAN 2.5 MG PO TABS
2.5000 mg | ORAL_TABLET | Freq: Two times a day (BID) | ORAL | Status: DC
Start: 2022-07-26 — End: 2022-07-26

## 2022-07-26 NOTE — Progress Notes (Signed)
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 s/p bilateral radical nephrectomies and  cystectomy on 06/28/22  - complicated by small bowel enterotomy and blood loss   New onset afib - on amiodarone and eliquis  High grade urothelial bladder CA: s/p bilateral nephrectomies and cystectomy c/b mall bowel enterotomy and blood loss (~1L) on 06/28/22  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 - remains low, adequate iron - on aranesp  Encephalopathy - better   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   Hypophosphatemia -  better  HTN - BP high     Recommendations:   HD tomorrow UF goal 2-3L  Monitor BP     Case discussed with: patient, daughter  Debara Pickett, MD  [facility]  [phone] (office)  [phone] (FFX Spectra-Link)      Subjective: resting      Review of Systems:   Resting     Physical Exam:     Vitals:    07/26/22 0000 07/26/22 0400 07/26/22 0800 07/26/22 0839   BP:   196/79 196/79   Pulse:   76 70   Resp:   (!) 30    Temp: 98.3 F (36.8 C) 98.3 F (36.8 C) 97.7 F (36.5 C)    TempSrc: Axillary Axillary Oral    SpO2:   97%    Weight:  86.9 kg (191 lb 9.3 oz)     Height:           Intake and Output Summary (Last 24 hours) at Date Time    Intake/Output Summary (Last 24 hours) at 07/26/2022 1005  Last data filed at 07/26/2022 0800  Gross per 24 hour   Intake 723.75 ml   Output 0 ml   Net 723.75 ml         General: resting  Cardiovascular: regular rate and rhythm  Lungs: bilateral air entry  Abdomen: soft  Extremities: trace  edema  Access: R permacath     Meds:      Scheduled Meds: PRN Meds:    acetaminophen, 650 mg, Oral, 4 times per day   Or  acetaminophen, 650 mg, Oral, 4 times per day  amiodarone, 200 mg, per NG tube, Daily  apixaban, 2.5 mg, Oral, Q12H SCH  carvedilol, 12.5 mg, Oral, Q12H SCH  darbepoetin alfa, 0.45 mcg/kg, Subcutaneous, Weekly  glycopyrrolate, 0.2 mg, Intravenous, Q8H  insulin glargine, 7 Units, Subcutaneous, QAM  insulin lispro, 1-8 Units, Subcutaneous, Q6H  pantoprazole, 40 mg, per NG tube, Q12H SCH  thiamine, 100 mg, per NG tube, Daily          Continuous Infusions:       sodium chloride, , PRN  acetaminophen, 650 mg, Q6H PRN  albuterol-ipratropium, 3 mL, Q6H PRN  benzocaine, 1 spray, TID PRN  dextrose, 15 g of glucose, PRN   Or  dextrose, 12.5 g, PRN   Or  dextrose, 12.5 g, PRN   Or  glucagon (rDNA), 1 mg, PRN  diphenhydrAMINE, 25 mg, Q6H PRN  hydrALAZINE, 10 mg, Q3H PRN  HYDROmorphone, 0.4 mg, Once PRN  labetalol, 10 mg, Q15 Min PRN  lidocaine 2% jelly, , Once PRN  naloxone, 0.2 mg, PRN  oxyCODONE, 2.5 mg, Q4H PRN  phenol, 1 spray, Q2H PRN              Labs:     Recent Labs   Lab 07/26/22  0558 07/25/22  1421 07/25/22  0705 07/25/22  0351   WBC 13.02* 12.91*  --  13.33*   Hemoglobin 8.7* 6.7* 6.1* 6.9*   Hematocrit 26.6* 21.0* 19.4* 20.9*   Platelet Count 445* 405*  --  446*     Recent Labs   Lab 07/26/22  0558 07/25/22  0351 07/24/22  1129 07/21/22  0220 07/20/22  0414   Sodium 138 137 140  More results in Results Review 135   Potassium 4.0 3.7 3.7  More results in Results Review 4.2   Chloride 99 100 102  More results in Results Review 100   CO2 23 23 26   More results in Results Review 22   BUN 45* 29* 21  More results in Results Review 57*   Creatinine 3.8* 2.5* 2.0*  More results in Results Review 4.8*   Calcium 8.7 8.0 8.4  More results in Results Review 9.0   Albumin  --   --   --   --  1.6*   Phosphorus 2.9 2.3 1.8*  More results in Results Review 4.2   Magnesium 2.0 1.8 1.8  More results in Results  Review 2.0   Glucose 170* 163* 99  More results in Results Review 79   GFR 11.1* 18.4* 24.0*  More results in Results Review 8.4*   More results in Results Review = values in this interval not displayed.           Signed by: Debara Pickett, MD, FASN

## 2022-07-26 NOTE — OT Progress Note (Signed)
Occupational Therapy Treatment  Shelby Barron        Post Acute Care Therapy Recommendations   Discharge Recommendations:  SNF      DME needs IF patient is discharging home: Hospital bed, [facility] Lift    Therapy discharge recommendations may change with patient status.  Please refer to most recent note for up-to-date recommendations.    Unit: [facility]ARDIOVASCULAR & NEURO INTENSIVE CARE  Bed: A2716/A2716-01    ___________________________________________________    Time of treatment:  OT Received On: 07/26/22  Start Time: 1425  Stop Time: 1459  Time Calculation (min): 34 min  Total Treatment Time (min): 17 (cotreat w/ PT to maximize safety)    Chart Review and Collaboration with Care Team: 5 minutes, not included in above time.    OT Visit Number: 4      Precautions and Contraindications:    Precautions  Weight Bearing Status: no restrictions  Other Precautions: skin & pressure ulcer; SBP 120-160; L side flaccid    Personal Protective Equipment (PPE)  gloves and procedure mask    Updated Labs:  Lab Results   Component Value Date/Time    HGB 8.7 (L) 07/26/2022 05:58 AM    HGB 8.6 (L) 06/03/2022 04:23 AM    HCT 26.6 (L) 07/26/2022 05:58 AM    HCT 26.8 (L) 06/03/2022 04:23 AM    K 4.0 07/26/2022 05:58 AM    K 3.6 06/03/2022 04:23 AM    NA 138 07/26/2022 05:58 AM    NA 137 06/03/2022 04:23 AM    INR 1.1 07/14/2022 04:35 AM    INR 1.1 06/01/2022 03:15 AM    TROPI 9.6 05/26/2022 06:37 PM    TROPI -2.2 05/26/2022 06:37 PM    TROPI 11.8 05/26/2022 03:26 PM       All imaging reviewed, please see chart for details.    Subjective: "Oh heck."          Patient's medical condition is appropriate for Occupational Therapy intervention at this time.  Patient is agreeable to participation in the therapy session. Nursing clears patient for therapy.  Pain Assessment  Pain Assessment: No/denies pain      Objective:  Observation of Patient/Vital Signs:  Stable      Cognition/Neuro Status  Arousal/Alertness: Delayed responses to  stimuli  Attention Span: Difficulty attending to directions  Orientation Level: Oriented to person;Oriented to place;Disoriented to time;Disoriented to situation  Memory: Decreased recall of recent events  Following Commands: Follows one step commands with repetition;Follows one step commands with increased time  Safety Awareness: maximal verbal instruction  Insights: Educated in Engineer, building services  Problem Solving: dependent         Self-care and Home Management  UB Dressing: Maximal Assist;in bed  Toileting: Dependent;perineal hygiene        Educated the Patient to role of occupational therapy, plan of care, goals of therapy and safety with mobility and ADLs, energy conservation techniques with verbalized understanding  and demonstrated understanding.     Patient left in bed with alarm and all other medical equipment in place and call bell and all personal items/needs within reach.  RN notified of session outcome.         Assessment: Pt is progressing towards OT goals. Main limitations are decreased activity endurance, decreased functional mobility, weakness and fatigue limiting participation in functional mobility and ADL tasks. Pt has demonstrated an increase in sitting balance. Pt would benefit from continued OT services in order to maximize potential. Therapist continues  to recommend [facility] to SNF.               PMP Activity: Step 4 - Dangle at Bedside  Distance Walked (ft) (Step 6,7): 0 Feet      Plan:  OT Frequency Recommended: 2-3x/wk  Goal Formulation: Patient      Time For Goal Achievement: by time of discharge  ADL Goals  Patient will groom self: Moderate Assist, Not met     Neuro Re-Ed Goals  Pt will sit at edge of bed: Maximal Assist, for 3 minutes, Partly met  Other Goal: Pt will follow one step v commands 50% of the time  Musculoskeletal Goals  Pt will perform Home Exercise Program: maximal assist, with caregiver/family assist, Goal met  Executive Fucntion Goals  Pt will demonstrate attention to task: with  minimal assist, to increase ability to complete ADLs, Not met  Other Goal: Pt will be oriented X 3 75% of the time              Continue plan of care.      Gerhard Munch, COTA/L    Physical Medicine and Rehabilitation  [facility]  [phone]    07/26/2022  4:23 PM    [facility]  Patient: Shelby Barron MRN#: [id number]   Unit: [facility]O INTENSIVE CARE Bed: A2716/A2716-01

## 2022-07-26 NOTE — Progress Notes (Signed)
Infectious Diseases & Tropical Medicine  Progress Note    07/26/2022   Shelby Barron GMW:[id number],IHK:[id number] is a 82 y.o. female,       Assessment:     Encephalopathy  Acute CVA  CT scan of the head-no significant change in multifocal areas of subacute infarction, unchanged small area of petechial hemorrhage in left parietal and occipital lobes (07/26/2022)  History of bladder cancer and renal failure  Status post radical cystectomy/bilateral nephrectomy, enterectomy and lysis of adhesion (06/28/2022)  S/p small bowel resection  CT scan of abdomen and pelvis-no abscess or acute inflammatory process (07/19/2022)  Anemia improving post blood transfusion  Leukocytosis  End-stage renal disease on hemodialysis  Atrial fibrillation  Risk of aspiration    Plan:     Continue monitoring without antibiotics  Monitor leukocyte counts  Wound care nurse following  Follow-up chest x-ray  Continue probiotics  Monitor electrolytes and renal functions closely  Monitor clinically    ROS:     General: No fever, weak and lethargic, sleepy  HEENT: NG tube in place  Respiratory: No cough or shortness of breath  Gastrointestinal: Abdominal wall wound/wound VAC in place  Genito-Urinary: no hematuria  Musculoskeletal: no edema  Neurological: Sleepy  Dermatological: No rash    Physical Examination:     Blood pressure 196/79, pulse 70, temperature 97.7 F (36.5 C), temperature source Oral, resp. rate (!) 30, height 1.575 m (5\' 2" ), weight 86.9 kg (191 lb 9.3 oz), SpO2 97%.    General Appearance: Sleepy  HEENT: Pupils are equal, round, and reactive to light.   Lungs: Clear to auscultation  Heart: Regular rate and rhythm  Chest: Symmetric chest wall expansion.   Abdomen: soft, no hepatosplenomegaly, no bowel sounds, abdominal wall wound   Neurological: Sleepy  Extremities: No edema    Laboratory And Diagnostic Studies:     Recent Labs     07/26/22  0558 07/25/22  1421   WBC 13.02* 12.91*   Hemoglobin 8.7* 6.7*   Hematocrit 26.6* 21.0*    Platelet Count 445* 405*     Recent Labs     07/26/22  0558 07/25/22  0351   Sodium 138 137   Potassium 4.0 3.7   Chloride 99 100   CO2 23 23   BUN 45* 29*   Creatinine 3.8* 2.5*   Glucose 170* 163*   Calcium 8.7 8.0     No results for input(s): "AST", "ALT", "ALKPHOS", "PROT", "ALB" in the last 72 hours.      Current Meds:      Scheduled Meds: PRN Meds:    acetaminophen, 650 mg, Oral, 4 times per day   Or  acetaminophen, 650 mg, Oral, 4 times per day  amiodarone, 200 mg, per NG tube, Daily  [Held by provider] apixaban, 2.5 mg, Oral, Q12H SCH  carvedilol, 12.5 mg, Oral, Q12H SCH  darbepoetin alfa, 0.45 mcg/kg, Subcutaneous, Weekly  glycopyrrolate, 0.2 mg, Intravenous, Q8H  insulin glargine, 7 Units, Subcutaneous, QAM  insulin lispro, 1-8 Units, Subcutaneous, Q6H  pantoprazole, 40 mg, per NG tube, Q12H SCH  thiamine, 100 mg, per NG tube, Daily        Continuous Infusions:     sodium chloride, , PRN  acetaminophen, 650 mg, Q6H PRN  albuterol-ipratropium, 3 mL, Q6H PRN  benzocaine, 1 spray, TID PRN  dextrose, 15 g of glucose, PRN   Or  dextrose, 12.5 g, PRN   Or  dextrose, 12.5 g, PRN   Or  glucagon (rDNA), 1 mg,  PRN  diphenhydrAMINE, 25 mg, Q6H PRN  hydrALAZINE, 10 mg, Q3H PRN  HYDROmorphone, 0.4 mg, Once PRN  labetalol, 10 mg, Q15 Min PRN  lidocaine 2% jelly, , Once PRN  naloxone, 0.2 mg, PRN  oxyCODONE, 2.5 mg, Q4H PRN  phenol, 1 spray, Q2H PRN          Julien Oscar A. Janalyn Rouse, M.D.  07/26/2022  9:34 AM

## 2022-07-26 NOTE — Plan of Care (Incomplete)
NURSING SHIFT NOTE     Patient: Shelby Barron  Day: 28      SHIFT EVENTS     Shift Narrative/Significant Events (PRN med administration, fall, RRT, etc.):     ***    Safety and fall precautions remain in place. Purposeful rounding completed.          ASSESSMENT     Changes in assessment from patient's baseline this shift:    Neuro: {yes:21351::"No"}  CV: {yes:21351::"No"}  Pulm: {yes:21351::"No"}  Peripheral Vascular: {yes:21351::"No"}  HEENT: {yes:21351::"No"}  GI: {yes:21351::"No"}  BM during shift: {yes:21351::"No"}, Last BM: Last BM Date: 07/26/22  GU: {yes:21351::"No"}   Integ: {yes:21351::"No"}  MS: {yes:21351::"No"}    Pain: {Pain at site:39313::"None"}  Pain Interventions: {RELIEVING FACTORS:28901}  Medications Utilized: {Meds; pain:60255}    Mobility: PMP Activity: Step 3 - Bed Mobility of Distance Walked (ft) (Step 6,7): 0 Feet           Lines     Patient Lines/Drains/Airways Status       Active Lines, Drains and Airways       Name Placement date Placement time Site Days    Permacath Catheter - Tunneled 07/14/22 Subclavian vein catheter Right 07/14/22  1404  -- 11    Peripheral IV 07/25/22 20 G Right Antecubital 07/25/22  1700  Antecubital  less than 1    Peripheral IV 07/25/22 22 G Anterior;Left Forearm 07/25/22  1700  Forearm  less than 1    Midline IV 07/01/22 Anterior;Left Upper Arm 07/01/22  1315  Upper Arm  24    Feeding Tube ND 12 Fr. Right nare 07/05/22  1130  Right nare  20                         VITAL SIGNS     Vitals:    07/26/22 0000   BP:    Pulse:    Resp:    Temp: 98.3 F (36.8 C)   SpO2:        Temp  Min: 97.5 F (36.4 C)  Max: 98.9 F (37.2 C)  Pulse  Min: 56  Max: 85  Resp  Min: 13  Max: 39  BP  Min: 125/61  Max: 168/69  SpO2  Min: 97 %  Max: 99 %      Intake/Output Summary (Last 24 hours) at 07/26/2022 0122  Last data filed at 07/25/2022 2001  Gross per 24 hour   Intake 933.75 ml   Output 0 ml   Net 933.75 ml            ***Remove Oxygen Weaning section if not applicable.***  OXYGEN  WEANING     Stable to wean?: {YES/NO:21936::"Yes"}  Attempted to wean?: {YES/NO:21936::"Yes"}     Symptoms:     Oxygen saturation at rest maintained at:  ***% at {oxygen delivery:30093::"room air"}          Oxygen saturation with ambulation maintained at:  ***% at {oxygen delivery:30093::"room air"}          Patient self-proning?: {yes no:315493}     Patient using incentive spirometer?: {yes no:315493}   Amount achieved on incentive spirometer?: No data recorded      CARE PLAN       Problem: Neurological Deficit  Goal: Neurological status is stable or improving  Outcome: Progressing  Flowsheets (Taken 07/22/2022 2019 by Mar Daring, RN)  Neurological status is stable or improving:   Monitor/assess/document neurological assessment (Stroke: every 4 hours)  Monitor/assess NIH Stroke Scale   Re-assess NIH Stroke Scale for any change in status   Observe for seizure activity and initiate seizure precautions if indicated   Perform CAM Assessment     Problem: Every Day - Stroke  Goal: Neurological status is stable or improving  Outcome: Progressing  Flowsheets (Taken 07/22/2022 2019 by Mar Daring, RN)  Neurological status is stable or improving:   Monitor/assess/document neurological assessment (Stroke: every 4 hours)   Monitor/assess NIH Stroke Scale   Re-assess NIH Stroke Scale for any change in status   Observe for seizure activity and initiate seizure precautions if indicated   Perform CAM Assessment  Goal: Stable vital signs and fluid balance  Outcome: Progressing  Flowsheets (Taken 07/26/2022 0121)  Stable vital signs and fluid balance:   Apply telemetry monitor as ordered   Monitor and assess vitals every 4 hours or as ordered and hemodynamic parameters

## 2022-07-26 NOTE — Progress Notes (Signed)
Wound Ostomy Continence Consultation / Progress Note    Date Time: 07/26/22 2:51 PM  Patient Name: Shelby Barron, Shelby Barron  Consulting Service: [facility] Day: 29     Reason for Consult / Follow Up   Vac change    Assessment & Plan   Assessment:     Pt assessed in her room resting in bed accompanied by friend. She is awake and does verbalize. She does somewhat follow commands. She endorses pain with vac change and often says "help me [name]" (her childhood custodial aunt). She is moving her right arm, did not notice her left arm move.    She was given pain medicine to help mitigate pain from vac change.     Wound Type and Location:  Surgical Wound Mid Abdomen  Dimensions: 26 cm x 7 cm x 7 cm (anterior abdominal wound ~9x9 cm)  Wound Bed:  Full thickness tissue loss with beefy red granulation tissue along wound edges, primarily proximal aspect of wound. Granulation tissue 50% Nongranulation tissue 20% Slough (black and yellow) 30%. A few sutures are noted in the midline incision around the umbilicus   Undermining:no   Tunneling: no  Peri Wound/ Edges: Right lower abdominal skin continues to have necrotic wound bed. There is induration noted, and unsure if the anterior aspect of the wound is an evolution of tissue as the right lower portion of the abdominal incision continues to have nonviable tissue (also left side, but not as marked and no induration). There is seropurulent drainage from this area. Spoke with primary.   Drainage:  brownish serosanguinous  Pain:  yes gets prn med  Odor:  none      Wound care to mid abdomen/ Vac Dressing Change     Area cleansed with Vashe wash (soaked for 5-10 minutes ) and  patted dry.    Applied  No-Sting skin barrier film to peri-wound and allowed to dry.   Placed Vac drape around peri-wound window paned style, to prevent maceration and applied Eakin's ring around umbilicus, top of wound and along the pannus and  curvature of pannus for more adherence.     Medical Adhesive  Related Skin Injury  (MARSI) vs necrosis of other cause - Patient has partial thickness skin loss,  peri wound from vac drape between 6 and 9 O'clock peri wound.  Noted that peri wound area around wound is worsening.  Applied Plurogel directly to wound bed after cleansing and covered with silicone dressing.  This was topped with vac drape.      Packed 4 pieces of silver non adherent dressing and top with  two pieces of white foam and 2 pieces of of black foam into a wound  Secured with vac drape.   A quarter size cut hole was cut into the black foam to fit the trackpad.   Good seal obtained and wound vacuum setting at 125 mm Hg, low intensity continuous.  An aseptic technique utilized seemed to tolerate it well with pain medication.       Vashe Wound Solution helps to cleanse the wound and accomplish the goals of wound bed preparation in a biocompatible, safe, effective, and natural way. This solution of hypochlorous acid (HOCl) acts as a preservative that inhibits microbial contamination within the solution. Vashe Wound Solution has the highest concentration of pure HOCl.     Vashe comes from supply rooms or central supply.       Plurogel- contains a non -cytotoxic surfactant that aids in maintaining  a moist wound healing environment which promotes autolytic debridement. Indicated for use on a partial thickness and full thickness lightly to moderately draining wounds, 1st, and second-degree burn.      Plurogel is in the wound care office.      Moisture Associated Skin Damage- staff reported skin tear. Partial thickness skin loss to Left buttock.  Recommend use of Triad to site.  Partial thickness skin loss reported ( did not visualize) patient in great deal of pain after vac application and with pain med. Recommend treatment per policy .  Wound care:  1. Clean wound with wound cleanser or incontinence wipes.  2. Apply a layer of Triad (thickness of a dime) directly on the wound. If Triad not sticking to wound bed,  pat to dry and reapply.  3. Keep open to air.     Triad Hydrophilic Wound Dressing is a Zinc-oxide based hydrophilic paste for light-to- moderate levels of wound exudates. Helps maintain an optimal wound healing environment to facilitate natural autolytic debridement. Ideal alternative for difficult-to-dress areas and varying wound etiologies.     Initiate/ continue pressure prevention bundle:  Head of the bed 30 degrees or less  Positioning device to the bedside  Eliminate/minimize pressure from the area  Float heels with boots or pillows  Turn patient  Pressure redistribution cushion to the chair  Use lift sheets/low friction surface sheets for positioning  Pad bony prominences  Nutrition consult/Optimize nutrition  Initiate bed algorithm/Specialty bed  Moisture/Incontinence management - Cleanse with incontinence cleansing wipe/water to manage incontinence and to protect skin from exposure to urine and stool. Apply skin barrier protection cream. Apply [place]/external or Barron external urine management system to prevent urinary contamination of a wound. Apply rectal pouch/fecal management system per unit policy, to prevent fecal contamination of a wound or to contain diarrhea.    Wound Photography:       Patient Education:  Discussed with the patient, very confused.     Specialty Bed: Progressa mattress - Armed forces operational officer, incontinence management systems, and StayInPlace technology to address the five factors of skin breakdown - pressure, shear, friction, heat, and moisture - for optimal wound healing and skin protection.     History of Present Illness   This is a Shelby Barron  has a past medical history of Acid reflux, Asthma, well controlled, Cancer of kidney, Chronic lower back pain, Congestive heart disease, COPD (chronic obstructive pulmonary disease), CVA (cerebral infarction) (01/12/2003), Diabetes, Diabetic nephropathy, Fibroids, Gout, H/O: gout, Hyperlipidemia, Hypertension,  Neuropathy of hand, and SOB (shortness of breath)..  Admitted with Bladder cancer.        Procedure(s) with comments:  TUNNELED CATH REMOVAL - right neck/chest tunneled HD catheter removal    28 Days Post-Op  -------------------  **Canceled**    Procedure(s) with comments:  TUNNELED CATH REMOVAL - right neck/chest tunneled HD catheter removal    * No surgery date entered *  -------------------    Procedure(s) with comments:  TUNNELED CATH REMOVAL - right neck/chest tunneled HD catheter removal    26 Days Post-Op  -------------------    Procedure(s) with comments:  TUNNELED CATH REMOVAL - right neck/chest tunneled HD catheter removal    12 Days Post-Op  -------------------    Procedure(s) with comments:  TUNNELED CATH REMOVAL - right neck/chest tunneled HD catheter removal    12 Days Post-Op  -------------------        From Nursing/Other Documentation:   Bowel Incontinence: Yes (07/26/22 1200)  Last BM  Date: 07/26/22 (07/26/22 0800)  Urinary Incontinence: No (07/26/22 1200)    Ht Readings from Last 1 Encounters:   07/18/22 1.575 m (5\' 2" )     Wt Readings from Last 3 Encounters:   07/26/22 86.9 kg (191 lb 9.3 oz)   06/25/22 87.1 kg (192 lb)   06/23/22 84.8 kg (187 lb)     Body mass index is 35.04 kg/m.    Recent Labs     07/26/22  0558   WBC 13.02*   RBC 2.95*   Hemoglobin 8.7*   Hematocrit 26.6*   Sodium 138   Potassium 4.0   Chloride 99   CO2 23   BUN 45*   Creatinine 3.8*   Calcium 8.7   GFR 11.1*   Glucose 170*     Lab Results   Component Value Date    HGBA1C 5.6 07/14/2022    HGBA1C 8.5 (H) 05/27/2022    GLU 170 (H) 07/26/2022    LDL 23 07/14/2022    CREAT 3.8 (H) 07/26/2022     Recent Labs   Lab 07/26/22  1213 07/26/22  0524 07/25/22  2331 07/25/22  1757 07/25/22  1151 07/25/22  0544 07/25/22  0035 07/24/22  1806 07/24/22  1551 07/24/22  0559   Whole Blood Glucose POCT 199* 155* 118* 97 108* 166* 159* 87 106* 164*     Fanny Skates, MSN, RN, [employer], [facility]  Inpatient Wound, Ostomy, and Continence Nurse  Coordinator  [facility]  [phone]

## 2022-07-26 NOTE — UM Notes (Signed)
Continued Stay Review 07/26/22:    PATIENT NAME: Shelby Barron, Shelby Barron   DOB: [birth year]     Admit to Inpatient (Order #[id number]) on 06/28/22     Admit Diagnosis: Malignant neoplasm of urinary bladder, unspecified site [C67.9]  Malignant tumor of kidney, left [C64.2]  Bladder cancer [C67.9]  Facility: [facility]    Reason for Continued Stay  -82 year female admitted to inpatient at [facility] on 6/10. Patient past medical COPD, [name], type 2 diabetes, renal cell carcinoma status post partial bilateral nephrectomy, [name], new diagnosis of urothelial cancer.  She was recently admitted in May 2024 at which time she was transitioned from PD to HD.  At that time was diagnosed with urothelial cancer.  Patient presented for scheduled cystectomy and completion nephrectomy with urology. [name], [name], extubated and off pressors since 6/13. Patient remains for continued monitoring.     Vital Signs  Temp 98  HR 75  RR 26  BP 121/60  SPO2 99% on RA    Abnormal Labs   Latest Reference Range & Units 07/24/22 11:29   WBC 3.10 - 9.50 x10 3/uL 15.15 (H)   Hemoglobin 11.4 - 14.8 g/dL 7.5 (L)   Hematocrit 47.8 - 43.7 % 23.0 (L)   Platelet Count 142 - 346 x10 3/uL 444 (H)   RBC 3.90 - 5.10 x10 6/uL 2.51 (L)   Creatinine 0.4 - 1.0 mg/dL 2.0 (H)   EGFR >=29.5 AO/ZHY/8.65 m2 24.0 (L)         Plan of Care  Per Medicine  #Toxic metabolic Encephalopathy/delirium  Patient was upgraded to ICU on 6/30 for lethargy and decreased responsiveness  CT head was repeated on 6/29, no acute finding  ABG consistent with respiratory alkalosis  EEG consistent with encephalopathy no seizure, MRI no acute stroke  Culture pending  CT abdomen with and without contrast after premedication did not show any leak or abscess  Continue supportive care  - SLP advancing diet slowly. Switched to nocturnal tube feeds and will  wean as able.  - Palliative consulted:  Family is clear they would like no limits to care and patient should remain full code.     #s/p open cystectomy, bilateral nephrectomies, and resection of small bowel after intra-op enterotomy    #Mixed shock - hemorrhagic vs septic: Resolved  #Leukocytosis  - 6/10 small bowel enteretomy with 1L EBL. Went to the OR on June 12 for small bowel anastomosis, closure of mesenteric defect, closure of abdominal wall with wound VAC application. Wound VAC last changed on 7/5  - Surgery consulted, recommend CTAP with PO contrast that showed no anastomotic leak. Low concern for abscesses as they'd expect these to have a more loculated appearance in the interim between 6/17 and 7/1 imaging  - ID consulted, completed meropenem and Mycamine for 2 weeks on 7/3     #Acute CVA on the right frontal lobe  #History of CVA  - Hold home plavix  - Left upper extremity not moving at all unclear duration  - CT head showed right frontal infarction  - Neurology following initially started on ASA 81mg  only due to concern for risk of hemorrhagic conversion. Now > 10 days out. OK to switch to Eliquis for Afib and discontinue aspirin per neurology on 7/5  - SBP goal 120-160  - Carotid Doppler showed plaque with no critical  stenosis  - Repeat head CT done on 6/29 for lethargy showed no acute finding  - EEG encephalopathic pattern no seizure, repeat MRI no new infarction  - PT OT recommend SNF     #Acute hypoxemic respiratory failure resolved  #COPD  Extubated on 6/13, currently on room air  Maintain SaO2 > 92%  Continue as needed neb treatment  X-ray repeated on 6/29 showed resolution of interstitial edema     #ESRD  S/p CRRT this admission, now on HD TTS  Continue dialysis per nephrology  IR removed the nonfunctional permacath and right [place] IJ cath and replaced with a new permacath on 6/26.     #HTN  On metoprolol   PRN IV labetalol for SBP > 180      New onset Afib with RVR, improved  - CHA2DS2-VASc  score is 7  - Status post amiodarone drip. Now stable on oral amiodarone and coreg per cardiology recommendations  - GI and general surgery cleared for anticoagulation. Neurology recommended to hold anticoagulation initiation for 10 days given the size of stroke. Now started on Eliquis since 07/23/22        #Renal cell carcinoma outpatient follow-up hematology oncology  #Urothelial carcinoma  #Thrombocytopenia  - VTE ppx: SCDs, subcutaneous heparin  --     #Diabetes Mellitus type 2  -Sliding scale insulin             Medications  Current Facility-Administered Medications   Medication Dose Route Frequency Last Rate Last Admin    0.9% NaCl infusion   Intravenous PRN        acetaminophen (TYLENOL) tablet 650 mg  650 mg Oral 4 times per day   650 mg at 07/24/22 1801    Or    acetaminophen (TYLENOL) 160 MG/5ML oral liquid 650 mg  650 mg Oral 4 times per day   650 mg at 07/26/22 1112    acetaminophen (TYLENOL) tablet 650 mg  650 mg Oral Q6H PRN   650 mg at 07/23/22 1421    albuterol-ipratropium (DUO-NEB) 2.5-0.5(3) mg/3 mL nebulizer 3 mL  3 mL Nebulization Q6H PRN        amiodarone (PACERONE) tablet 200 mg  200 mg per NG tube Daily   200 mg at 07/25/22 1753    apixaban (ELIQUIS) tablet 2.5 mg  2.5 mg Oral Q12H SCH        benzocaine (HURRICAINE) 20 % mouth spray 1 spray  1 spray Mouth/Throat TID PRN   1 spray at 07/04/22 1102    carvedilol (COREG) tablet 12.5 mg  12.5 mg Oral Q12H SCH   12.5 mg at 07/26/22 0839    darbepoetin alfa (ARANESP) injection 40 mcg  0.45 mcg/kg Subcutaneous Weekly   40 mcg at 07/24/22 2240    dextrose (GLUCOSE) 40 % oral gel 15 g of glucose  15 g of glucose Oral PRN        Or    dextrose (D10W) 10% bolus 125 mL  12.5 g Intravenous PRN        Or    dextrose 50 % bolus 12.5 g  12.5 g Intravenous PRN        Or    glucagon (rDNA) (GLUCAGEN) injection 1 mg  1 mg Intramuscular PRN        diphenhydrAMINE (BENADRYL) injection 25 mg  25 mg Intravenous Q6H PRN        glycopyrrolate (ROBINUL) injection 0.2 mg   0.2 mg Intravenous Q8H   0.2 mg at  07/26/22 1112    hydrALAZINE (APRESOLINE) injection 10 mg  10 mg Intravenous Q3H PRN   10 mg at 07/26/22 0840    insulin glargine (LANTUS) injection 7 Units  7 Units Subcutaneous QAM   7 Units at 07/26/22 0840    insulin lispro injection 1-8 Units  1-8 Units Subcutaneous Q6H   1 Units at 07/26/22 1255    labetalol (NORMODYNE,TRANDATE) injection 10 mg  10 mg Intravenous Q15 Min PRN   10 mg at 07/23/22 0517    lidocaine 2% urethral/mucosal jelly   Each Nare Once PRN        naloxone (NARCAN) injection 0.2 mg  0.2 mg Intravenous PRN        ondansetron (ZOFRAN) injection 4 mg  4 mg Intravenous Q6H PRN   4 mg at 07/26/22 1112    oxyCODONE (ROXICODONE) immediate release tablet 2.5 mg  2.5 mg Oral Q4H PRN   2.5 mg at 07/26/22 1028    pantoprazole (PROTONIX) 40 mg/20 mL oral suspension 40 mg  40 mg per NG tube Q12H SCH   40 mg at 07/26/22 0839    phenol (CHLORASEPTIC) liquid 1 spray  1 spray Oral Q2H PRN   1 spray at 07/22/22 1610    thiamine (VITAMIN B1) tablet 100 mg  100 mg per NG tube Daily   100 mg at 07/26/22 9604       Primary Coverage:  Payor: MEDICARE [employer] / Plan: UHC MED ADV [id number] / Product Type: MANAGED MEDICARE /     UTILIZATION REVIEW CONTACT: Wendall Stade RN, BSN  Utilization Review   [facility]  [phone]   [phone]  Email: [email or web address]  Tax ID:  [id number]         NOTES TO REVIEWER:    This clinical review is based on/compiled from documentation provided by the treatment team within the patient's medical record.

## 2022-07-26 NOTE — Progress Notes (Signed)
[facility]  PROGRESS NOTE      Patient: Shelby Barron  Date:07/08/2022   LOS: 28 Days  Admission Date: 06/28/2022   MRN: [id number]  Attending: Lennox Pippins, MD  When on service as the attending, please contact me on Epic Secure Chat from 7 AM - 7 PM for non-urgent issues. For urgent matters use XTend page from 7 AM - 7 PM.       ASSESSMENT/PLAN     Shelby Barron is a 82 y.o. female admitted with Bladder cancer    Interval Summary: [name], [name], type 2 diabetes, renal cell carcinoma status post partial bilateral nephrectomy, [name], new diagnosis of urothelial cancer initally presented on 6/10 for elective cystectomy and completion nephrectomy, [name], [name], extubated and off pressors since 6/13.     She is hypoactive and delirious. Surgery recommended NGT and start TF.  Has [name] T/Th/Sat. Nephrology following     Developed new onset afib RVR during HD. Stopped norvasc switched to metoprolol and gave additional IV metoprolol. Consulted cardiology. Discussed with ICU requested a consult given deterioration. Recommended repeat blood cultures and broadening antibiotics to meropenem. Palliative care consulted.    No further episodes of Afib RVR now on amiodarone gtt and PO metoprolol. Hgb stable. Repeating CXR, clear.     Updates as of 7/8:  Stopped aspirin and started eliquis on 7/5.  H/H drop but no overt bleeding on 7/7. Transfused 1U pRBCs. Also started on Aranesp. CTH showed few areas of cortical hyperdensity - petechial hemorrhage vs cortical laminar necrosis. OK to continue with eliquis per neuro given stable neuro exam. Repeat CTH today pending.  Advancing diet and weaning tube feeds. Likely [place] to SNF once NGT removed vs PEG if unable to wean feeds later this week    For tomorrow:  Follow up interval CT head / neuro recs  Wean TF as able - follow up with  SLP/nutrition. If no significant progress in the next few days, need to consider PEG tube placement. Pt was advanced to pureed diet today.    Anticipated Discharge: 48+ hours    Barriers to Discharge: Clinical improvement    Family/Social/Case Mgr Assistance: SNF pending weaning off Tfs & NGT removal  Patient Active Hospital Problem List:    #Toxic metabolic Encephalopathy/delirium  - Patient was upgraded to ICU on 6/30 for lethargy and decreased responsiveness  - CT head was repeated on 6/29, no acute finding  - ABG consistent with respiratory alkalosis  - EEG consistent with encephalopathy no seizure, MRI no acute stroke  - CT abdomen with and without contrast after premedication did not show any leak or abscess  - SLP advanced diet to pureed today. Currently on nocturnal tube feeds and will wean as able. May need to consider PEG placement if within GOC/no significant progress this week.  - Palliative consulted:  Family is clear they would like no limits to care and patient should remain full code.    #Anemia, 2/2 CKD and blood loss during OR  - TSAT 18%  - continue Aranesp per nephrology  - Trend CBC, transfuse Hgb<7. Status post 1U pRBC for Hgb 6.1 on 7/7 with improvement    #s/p open cystectomy, bilateral nephrectomies, and resection of small bowel after intra-op enterotomy    #Mixed shock - hemorrhagic vs septic: Resolved  #  Leukocytosis  - 6/10 small bowel enteretomy with 1L EBL. Went to the OR on June 12 for small bowel anastomosis, closure of mesenteric defect, closure of abdominal wall with wound VAC application. Wound VAC last changed on 7/8  - Surgery consulted, recommend CTAP with PO contrast that showed no anastomotic leak. Low concern for abscesses as they'd expect these to have a more loculated appearance in the interim between 6/17 and 7/1 imaging  - ID consulted, completed meropenem and Mycamine for 2 weeks on 7/3.     #Acute CVA on the right frontal lobe  #History of CVA  #Petechial hemorrhage vs  cortical laminar necrosis on CTH  - Left upper extremity not moving at all unclear duration  - Neurology following, initially started on ASA 81mg  only due to concern for risk of hemorrhagic conversion. Now OK to switch to Eliquis for Afib and discontinue aspirin per neurology on 7/5 as > 10d post event.  - SBP goal 120-160  - Carotid Doppler showed plaque with no critical stenosis  - Head CT on 7/7 w/ a few areas of cortical hyperdensity - petechial hemorrhage vs cortical laminar necrosis. Discussed with neuro OK to continue eliquis for now. Plan for repeat CTH today. Neuro exam stable.  - EEG encephalopathic pattern no seizure, repeat MRI no new infarction  - PT OT recommend SNF    #Acute hypoxemic respiratory failure resolved  #COPD  Extubated on 6/13, currently on room air  Maintain SaO2 > 92%  Continue as needed neb treatment  X-ray repeated on 6/29 showed resolution of interstitial edema    #ESRD  S/p CRRT this admission, now on HD TTS  Continue dialysis per nephrology  IR removed the nonfunctional permacath and right [place] IJ cath and replaced with a new permacath on 6/26.    #HTN  On metoprolol   PRN IV labetalol for SBP > 180     New onset Afib with RVR, improved  - CHA2DS2-VASc score is 7  - Status post amiodarone drip. Now stable on oral amiodarone and coreg per cardiology recommendations  - GI and general surgery cleared for anticoagulation. Neurology recommended to hold anticoagulation initiation for 10 days given the size of stroke. Now started on Eliquis since 07/23/22      #Renal cell carcinoma outpatient follow-up hematology oncology  #Urothelial carcinoma  #Thrombocytopenia  - VTE ppx: SCDs, eliquis     #Diabetes Mellitus type 2  -Sliding scale insulin          Nutrition: Tube feeds  Malnutrition Documentation    Moderate Malnutrition related to inadequate nutritional intake in the setting of acute illness as evidenced by <50% EER > 5 days, mild muscle losses (temporalis, deltoid) - new                  Patient has BMI=Body mass index is 35.04 kg/m.  Diagnosis: Obesity based on BMI criteria                                                                 Recent Labs   Lab 07/26/22  0558 07/25/22  1421 07/25/22  0705 07/25/22  0351   Hemoglobin 8.7* 6.7* 6.1* 6.9*   Hematocrit 26.6* 21.0* 19.4* 20.9*   MCV 90.2 92.9  --  93.3   WBC 13.02* 12.91*  --  13.33*   Platelet Count 445* 405*  --  446*     Recent Labs   Lab 07/18/22  0421 06/02/22  0514   Ferritin 2,300.21*  --    Iron 18* 62   TIBC 99* 145*   Iron Saturation 18 43       Code Status: Full Code    Dispo: pending improved mental status    Family Contact: Updated daughter    DVT Prophylaxis:   Current Facility-Administered Medications (Includes Only Anticoagulants, Misc. Hematological)   Medication Dose Route Last Admin    [Held by provider] apixaban (ELIQUIS) tablet 2.5 mg  2.5 mg Oral 2.5 mg at 07/25/22 0900          CHART  REVIEW & DISCUSSION     The following chart items were reviewed as of 9:55 AM on 07/26/22:  [x]  Lab Results [x]  Imaging Results   [x]  Problem List  [x]  Current Orders [x]  Current Medications  [x]  Allergies  []  Code Status []  Previous Notes   []  SDoH    The management and plan of care for this patient was discussed with the following specialty consultants:  [x]  Cardiology  [x]  Gastroenterology                 []  Infectious Disease  []  Pulmonology [x]  Neurology                [x]  Nephrology  []  Neurosurgery []  Orthopedic Surgery  [x]  ICU []  General Surgery []  Psychiatry                                   [x]  Palliative    SUBJECTIVE     Shelby Barron   Alert  Friend visiting bedside  Wound vac change underway on my arrival, see images from wound care note- some areas of necrosis    MEDICATIONS     Current Facility-Administered Medications   Medication Dose Route Frequency    acetaminophen  650 mg Oral 4 times per day    Or    acetaminophen  650 mg Oral 4 times per day    amiodarone  200 mg per NG tube Daily    [Held by provider]  apixaban  2.5 mg Oral Q12H [facility]    carvedilol  12.5 mg Oral Q12H SCH    darbepoetin alfa  0.45 mcg/kg Subcutaneous Weekly    glycopyrrolate  0.2 mg Intravenous Q8H    insulin glargine  7 Units Subcutaneous QAM    insulin lispro  1-8 Units Subcutaneous Q6H    pantoprazole  40 mg per NG tube Q12H [facility]    thiamine  100 mg per NG tube Daily       PHYSICAL EXAM     Vitals:    07/26/22 0839   BP: 196/79   Pulse: 70   Resp:    Temp:    SpO2:        Temperature: Temp  Min: 97.5 F (36.4 C)  Max: 98.6 F (37 C)  Pulse: Pulse  Min: 56  Max: 85  Respiratory: Resp  Min: 13  Max: 31  Non-Invasive BP: BP  Min: 125/61  Max: 196/79  Pulse Oximetry SpO2  Min: 97 %  Max: 99 %    Intake and Output Summary (Last 24 hours) at Date Time    Intake/Output Summary (Last 24  hours) at 07/26/2022 0955  Last data filed at 07/26/2022 0800  Gross per 24 hour   Intake 723.75 ml   Output 0 ml   Net 723.75 ml           GEN APPEARANCE: Sleeping but arousable oriented x 1  HEENT: PERLA; EOMI; Conjunctiva Clear  NECK: Supple; No bruits  CVS: RRR, S1, S2; No M/G/R  LUNGS: CTAB; wheezing improved, no Rhonchi: No rales  ABD: Soft; No TTP; + Normoactive BS  EXT: No edema; Pulses 2+ and intact  NEURO: moving right upper extremity spontaneously, able to move lower extremities for pain but minimal, no movement at all on the left upper extremity  MENTAL STATUS: Lethargic, oriented x 1, able to follow simple commands    LABS     Recent Labs   Lab 07/26/22  0558 07/25/22  1421 07/25/22  0705 07/25/22  0351   WBC 13.02* 12.91*  --  13.33*   RBC 2.95* 2.26*  --  2.24*   Hemoglobin 8.7* 6.7* 6.1* 6.9*   Hematocrit 26.6* 21.0* 19.4* 20.9*   MCV 90.2 92.9  --  93.3   Platelet Count 445* 405*  --  446*       Recent Labs   Lab 07/26/22  0558 07/25/22  0351 07/24/22  1129 07/23/22  0242 07/22/22  0219   Sodium 138 137 140 139 136   Potassium 4.0 3.7 3.7 3.9 3.9   Chloride 99 100 102 103 101   CO2 23 23 26 24 21    BUN 45* 29* 21 25* 43*   Creatinine 3.8* 2.5* 2.0* 2.7* 3.9*    Glucose 170* 163* 99 141* 151*   Calcium 8.7 8.0 8.4 8.8 8.8   Magnesium 2.0 1.8 1.8 1.8 1.8       Recent Labs   Lab 07/20/22  0414   ALT 9   AST (SGOT) 10   Bilirubin, Total 0.2   Albumin 1.6*   Alkaline Phosphatase 97                       Microbiology Results (last 15 days)       Procedure Component Value Units Date/Time    Culture, Blood, Aerobic And Anaerobic [[id number]] Collected: 07/18/22 1400    Order Status: Completed Specimen: Blood, Venous Updated: 07/23/22 2200     Culture Blood No growth at 5 days    Culture, Blood, Aerobic And Anaerobic [[id number]] Collected: 07/18/22 1400    Order Status: Completed Specimen: Blood, Venous Updated: 07/23/22 2200     Culture Blood No growth at 5 days             RADIOLOGY     CT Head WO Contrast    Result Date: 07/25/2022   1.Redemonstration of multifocal areas of bilateral acute infarctions. A few areas of cortical hyperdensity likely represent petechial hemorrhage. No mass occupying hematoma. No significant mass effect or midline shift. 2.Redemonstrated right mastoid and right middle ear cavity effusion with areas of bony thinning versus dehiscence of the right tegmen tympani and right tegmen mastoideum. Vara Guardian, MD 07/25/2022 2:19 PM    CT Abdomen Pelvis W IV And PO Cont    Result Date: 07/19/2022   1.Slight interval increase in left-sided retroperitoneal fluid collection with the other abdominal fluid collections appearing relatively stable. 2. There is no definite abscess or acute inflammatory process. 3. Incidental note is made of a small paraesophageal type hernia. 4. Sigmoid diverticulosis is noted  without CT evidence of diverticulitis. 5. Other findings as noted above. Miguel Dibble, MD 07/19/2022 6:36 AM    MRI Brain WO Contrast    Result Date: 07/18/2022   Interval evolution of multifocal recent infarcts in the bilateral cerebral hemispheres. No new infarct. Milan Waunita Schooner, MD 07/18/2022 5:57 PM    XR Chest AP Portable    Result Date:  07/17/2022  1.Near-complete resolution of interstitial edema. 2.Improving pulmonary vascular congestion. Legrand Pitts, MD 07/17/2022 2:38 PM    CT Head WO Contrast    Result Date: 07/17/2022   1.No new acute intracranial pathology. 2.There are multiple regions of cytotoxic edema in the right > left frontal and left > right occipital lobes, consistent with known early subacute infarcts. Dannielle Burn 07/17/2022 8:26 AM    US Carotid Duplex Dopp Comp Bilateral    Result Date: 07/15/2022   1. Plaque formation in the right internal carotid artery bulb with elevated velocities. Stenosis is estimated at 50-69%. 2. Plaque in the left internal carotid artery with normal velocities. Any stenosis is less than 50%. Criteria for stenosis and velocity parameters are based on the guidelines of the SRU (Society of Radiologists in Ultrasound). Grant et al: Carotid Artery Stenosis: Grayscale and Doppler Ultrasound Diagnosis-- Society of Radiologists in Ultrasound Consensus Conference Ultrasound Quarterly. Volume 19, Number 4; December, 2003. Recommendations updated October 2021 Verne Carrow is an [facility] accredited facility Suszanne Finch, MD 07/15/2022 9:41 AM    Tunneled Cath Removal (Permcath)    Result Date: 07/14/2022  1. Nonfunctioning tunneled dialysis catheter as described above. 2. Successful placement of a new 19 cm Equistream catheter as described above. Dimitrios Papadouris, MD 07/14/2022 4:06 PM    Tunneled Cath Placement (Permcath)    Result Date: 07/14/2022  1. Nonfunctioning tunneled dialysis catheter as described above. 2. Successful placement of a new 19 cm Equistream catheter as described above. Dimitrios Papadouris, MD 07/14/2022 4:06 PM    MRI Brain WO Contrast    Result Date: 07/13/2022   1.Patchy areas of acute/recent infarction involving the cerebral hemispheres bilaterally. The infarcts have a configuration suggesting watershed territory infarcts. 2.Chronic ischemic changes as discussed. Georgann Housekeeper, MD 07/13/2022 10:28 PM    CT  Head WO Contrast    Result Date: 07/13/2022   1.New region of loss of gray-white matter differentiation in the right frontal lobe, concerning for acute infarction. 2.A few additional areas of poor gray-white matter differentiation for example in the left occipital lobe may be artifactual but could be correlated with MRI. 3.No evidence of acute intracranial hemorrhage or significant mass effect. These urgent results were discussed with and acknowledged by Dr. Don Broach on 07/13/2022 3:20 PM. Vara Guardian, MD 07/13/2022 3:21 PM    XR Chest AP Portable    Result Date: 07/10/2022  Interstitial markings have slightly increased, suggesting minimal edema. No other change. Wilmon Pali, MD 07/10/2022 4:49 PM    XR Chest AP Portable    Result Date: 07/07/2022   There is mild interstitial prominence suggesting mild congestive heart failure/fluid overload in the setting of shortness of breath [name], MD 07/07/2022 12:32 PM    XR Chest AP Portable    Result Date: 07/06/2022   Hypoventilation with basilar atelectasis. Charlott Rakes, MD 07/06/2022 3:35 PM    CT Abdomen Pelvis W IV And PO Cont    Result Date: 07/06/2022  1.Postoperative changes from bilateral nephrectomy and cystectomy. 2.Small amount of ascites including some areas which appear loculated. Small collections of fluid and gas  in the anterior midline abdomen may be postoperative but superimposed infection is not excluded. No rim-enhancing fluid collections or extravasation of oral contrast to suggest anastomotic leak. Demetrios Isaacs, MD 07/06/2022 5:51 AM    CT Head WO Contrast    Result Date: 07/06/2022   1.No acute intracranial abnormality is seen. Leandro Reasoner, MD 07/06/2022 3:56 AM    XR Abdomen AP    Result Date: 07/05/2022  Enteric tube terminates in the distal stomach. Elease Etienne, DO 07/05/2022 12:24 PM    XR Chest AP Portable    Result Date: 06/30/2022  1. Lines and tubes in adequate position. No pneumothorax. 2. Trace left pleural effusion and bibasilar atelectasis.  Jasmine December D'Heureux, MD 06/30/2022 4:53 PM    XR Chest AP Portable    Result Date: 06/30/2022  Catheters as described. Pankaj Dominica, MD 06/30/2022 2:07 PM    XR Chest AP Portable    Result Date: 06/28/2022   Right mainstem bronchial intubation. The tube should be withdrawn about 4 cm. Dr. Tildon Husky is aware. Wynema Birch, MD 06/28/2022 4:56 PM    XR OR MISCOUNT    Result Date: 06/28/2022   No unexpected foreign body. These critical results were discussed with and acknowledged by Angie R., the circulating nurse, who will communicate the findings to Everardo All, MD on 06/28/2022 3:02 PM. Bosie Helper, MD 06/28/2022 3:03 PM   Echo Results       None          No results found for this or any previous visit.    Signed,  Lennox Pippins, MD  9:55 AM 07/26/2022

## 2022-07-26 NOTE — Progress Notes (Signed)
Nutritional Support Services  Nutrition Follow-up    Shelby Barron 82 y.o. female   MRN: [id number]    Summary of Nutrition Recommendations:    Start Nepro via NDT @ 60 ml/hr over 10 hrs (6pm-4am) + 100 ml FW flushes Q4H   This will provide 900 kcal, 49 g protein, 436 ml free water (1036 ml w/ flushes) which meets 57% kcal and 47% of protein needs   Glucerna TID   - ONS provides 220 kcal, 10 g protein per serving   Monitor diet order/SLP assessments  Encourage oral intake    -----------------------------------------------------------------------------------------------------------------    D/w RN, MD, SLP                                                       ASSESSMENT DATA     Subjective Nutrition: Pt met at bedside with daughter and RN. Pt continues on full liquids, per daughter patient has mostly had broth and juice. She has not been drinking the ONS daily. SLP saw pt today, diet advanced to pureed. Will see how she eats over the next few days on this diet, TF adjusted to meet half of needs nocturnally to promote oral intake. If she does not meet her needs orally family to consider PEG.    Events of Current Admission:  83 yo female PMH COPD, CVA, DM, RCC s/p  partial bilateral nephrectomy, [name], and recent diagnosis of urothelial cancer. Patient initially admitted on 6/10 for scheduled cystectomy and complete nephrectomy. OR course was complicated by small bowel enterotomy and significant intraoperative bleeding (1 L EBL), patient was admitted to ICU in discontinuity with open abdomen. Patient went to the OR on 6/12 for small bowel anastomosis and closure with wound VAC placement. Patient weaned off pressors and extubated to nasal cannula pm 6/13. Initially patient required CRRT due to shock, decision to IHD on 6/15. Course was further complicated by delirium and encephalopathy. Patient downgraded out of ICU on 6/16. 6/25 weakness of left upper extremity and MRI showed areas of recent infarction involving  the cerebral hemisphere bilaterally and watershed territory pattern. 6/29 patient is more lethargic, upgraded back to ICU for close monitoring        Medical Hx:  has a past medical history of Acid reflux, Asthma, well controlled, Cancer of kidney, Chronic lower back pain, Congestive heart disease, COPD (chronic obstructive pulmonary disease), CVA (cerebral infarction) (01/12/2003), Diabetes, Diabetic nephropathy, Fibroids, Gout, H/O: gout, Hyperlipidemia, Hypertension, Neuropathy of hand, and SOB (shortness of breath).       Orders Placed This Encounter   Procedures    Adult diet Therapeutic/ Modified; Solid; Pureed (IDDSI level 4); Mildly thick (IDDSI level 2); Room service: No    Glucerna Shake (8 Oz Cans) Quantity: A. One; Frequency: TID (3 times a day) with meals    Tube feeding-Nocturnal     Intake:  None recorded    ANTHROPOMETRIC  Height: 157.5 cm (5\' 2" )  Weight: 86.9 kg (191 lb 9.3 oz)  Body mass index is 35.04 kg/m.     Weight History Summary  Stable     ESTIMATED NEEDS    Total Daily Energy Needs: 1566 to 2001 kcal  Method for Calculating Energy Needs: 18 kcal - 23 kcal per kg  at 87 kg (Actual body weight)  Rationale: obese, noncritical,  HD       Total Daily Protein Needs: 104.4 to 130.5 g  Method for Calculating Protein Needs: 2 g - 2.5 g per kg at 52.2 kg (Ideal body weight)  Rationale: obese, noncritical, HD      Total Daily Fluid Needs: 1044 to 1305 ml  Method for Calculating Fluid Needs: 20 ml - 25 ml  per kg at 52.2 kg (Ideal body weight)  Rationale: bmi, age    Pertinent Medications:   Lantus, SSI, PPI, thiamine, zofran    Pertinent labs:  Recent Labs   Lab 07/26/22  0558 07/25/22  1421 07/25/22  0705 07/25/22  0351 07/24/22  1129 07/23/22  0242 07/22/22  0219   Sodium 138  --   --  137 140 139 136   Potassium 4.0  --   --  3.7 3.7 3.9 3.9   Chloride 99  --   --  100 102 103 101   CO2 23  --   --  23 26 24 21    BUN 45*  --   --  29* 21 25* 43*   Creatinine 3.8*  --   --  2.5* 2.0* 2.7* 3.9*    Glucose 170*  --   --  163* 99 141* 151*   Calcium 8.7  --   --  8.0 8.4 8.8 8.8   Magnesium 2.0  --   --  1.8 1.8 1.8 1.8   Phosphorus 2.9  --   --  2.3 1.8* 2.6 3.5   GFR 11.1*  --   --  18.4* 24.0* 16.8* 10.8*   WBC 13.02* 12.91*  --  13.33* 15.15* 15.06*  --    Hematocrit 26.6* 21.0* 19.4* 20.9* 23.0* 23.3*  --    Hemoglobin 8.7* 6.7* 6.1* 6.9* 7.5* 7.6*  --        Physical Assessment: deferred d/t receiving nursing care  Edema: generalized non pitting edema per flowsheets   Skin: abdominal incision w/ wound vac   GI function: WNL                                                    NUTRITION DIAGNOSIS     Moderate Malnutrition related to inadequate nutritional intake in the setting of acute illness as evidenced by <50% EER > 5 days, mild muscle losses (temporalis, deltoid) - active                                                            INTERVENTION     Nutrition recommendation - Please refer to top of note                                                     MONITORING/EVALUATION     1. Goal: Patient will meet >80% of nutritional needs via tube feeding by next RD follow up - active   2. Goal: Pt will consume >75% of meals and ONS by follow up - new  Nutrition Risk Level: High (will follow up at least 2 times per week and PRN)     Marisa Sprinkles, RD, CNSC  Dietitian Clinical Specialist I  Spectralink [phone]

## 2022-07-26 NOTE — SLP Progress Note (Signed)
[facility]  Speech Therapy Treatment Note    Patient:  Shelby Barron MRN#:  [id number]  Unit:  [facility] Room/Bed:  A2716/A2716-01      Time of treatment:   SLP Received On: 07/26/22  Start Time: 1022  Stop Time: 1038  Time Calculation (min): 16 min       Personal Protective Equipment (PPE)  Gloves and Procedure Mask with Shield    Subjective:   Pt is alert/ verbal , tangential speech , daughter is present during this assessment, continued to have NG tube / tray at bedside untouched         Objective: Oropharyngeal analysis during PO trials in order to determine LRD,development and training of swallow strategies in order to improve swallow function and decrease risk of penetration/aspiration;  therapeutic feedings by SLP  and safe swallowing precautions education.       Assessment: pt presents with mild oral pharyngeal dysphagia characterized by delay in bolus transit , delay in pharyngeal swallow trigger and fairly adequate HLE upon subjective digital palpation. Pt tolerated 50% of oatmeal, 100%  of yogurt ,100% of applesauce and 3 fl oz of nectar thickened liquid via straw without s/s overt  aspiration or penetration        Plan/Recommendations:  IDDSI Pureed - PU4  and Mildly thick  MT2  Slow feeding rate, small bites / sips , upright position during and after the meals , Alternate solid and liquid , full assistance with meals , liquid wash, and check oral cavity for residue         Charges Minutes   SLP treat    Swallow treat 16       07/26/2022  Presley Raddle MS [facility] SLP   07/26/2022 2:24 PM  [phone]

## 2022-07-26 NOTE — Plan of Care (Signed)
Problem: Moderate/High Fall Risk Score >5  Goal: Patient will remain free of falls  Outcome: Progressing  Flowsheets (Taken 07/26/2022 0800)  High (Greater than 13):   MOD-Re-orient confused patients   MOD-Request PT/OT consult order for patients with gait/mobility impairment   MOD-Include family in multidisciplinary POC discussions   MOD-Place Fall Risk level on whiteboard in room   HIGH-Bed alarm on at all times while patient in bed   HIGH-Apply yellow "Fall Risk" arm band     Problem: Compromised Sensory Perception  Goal: Sensory Perception Interventions  Outcome: Progressing  Flowsheets (Taken 07/26/2022 0800)  Sensory Perception Interventions: Offload heels, Pad bony prominences, Reposition q 2hrs/turn Clock, Q2 hour skin assessment under devices if present     Problem: Compromised Moisture  Goal: Moisture level Interventions  Outcome: Progressing  Flowsheets (Taken 07/26/2022 0800)  Moisture level Interventions: Moisture wicking products, Moisture barrier cream     Problem: Compromised Activity/Mobility  Goal: Activity/Mobility Interventions  Outcome: Progressing  Flowsheets (Taken 07/26/2022 0800)  Activity/Mobility Interventions: Pad bony prominences, TAP Seated positioning system when OOB, Promote PMP, Reposition q 2 hrs / turn clock, Offload heels     Problem: Compromised Nutrition  Goal: Nutrition Interventions  Outcome: Progressing  Flowsheets (Taken 07/26/2022 0800)  Nutrition Interventions: Discuss nutrition at Rounds, I&Os, Document % meal eaten, Daily weights     Problem: Compromised Friction/Shear  Goal: Friction and Shear Interventions  Outcome: Progressing  Flowsheets (Taken 07/26/2022 0800)  Friction and Shear Interventions: Pad bony prominences, Off load heels, HOB 30 degrees or less unless contraindicated, Consider: TAP seated positioning, Heel foams     Problem: Inadequate Airway Clearance  Goal: Patent Airway maintained  Outcome: Progressing  Flowsheets (Taken 07/21/2022 1947 by Natale Lay,  RN)  Patent airway maintained:   Provide adequate fluid intake to liquefy secretions   Reposition patient every 2 hours and as needed unless able to self-reposition   Suction secretions as needed  Goal: Normal respiratory rate/effort achieved/maintained  Outcome: Progressing  Flowsheets (Taken 07/24/2022 1549 by Peagler, Alene, RN)  Normal respiratory rate/effort achieved/maintained: Plan activities to conserve energy: plan rest periods     Problem: Infection Prevention  Goal: Free from infection  Outcome: Progressing  Flowsheets (Taken 07/22/2022 2019 by Mar Daring, RN)  Free from infection:   Monitor/assess vital signs   Encourage/assist patient to turn, cough and perform deep breathing every 2 hours   Assess incision for evidence of healing   Monitor/assess output from surgical drain if present   Monitor/assess lab values and report abnormal values   Assess for signs and symptoms of infection     Problem: Impaired Mobility  Goal: Mobility/Activity is maintained at optimal level for patient  Outcome: Progressing  Flowsheets (Taken 07/24/2022 1549 by Peagler, Alene, RN)  Mobility/activity is maintained at optimal level for patient:   Encourage independent activity per ability   Consult/collaborate with Physical Therapy and/or Occupational Therapy     Problem: Constipation  Goal: Elimination patterns are normal or improving  Outcome: Progressing  Flowsheets (Taken 07/21/2022 1947 by Natale Lay, RN)  Elimination patterns are normal or improving: Assess for and discuss C. diff screening with LIP     Problem: Safety  Goal: Patient will be free from injury during hospitalization  Outcome: Progressing  Flowsheets (Taken 07/25/2022 0434 by Mar Daring, RN)  Patient will be free from injury during hospitalization:   Assess patient's risk for falls and implement fall prevention plan of care per policy   Provide and  maintain safe environment   Use appropriate transfer methods   Ensure appropriate safety devices are  available at the bedside   Include patient/ family/ care giver in decisions related to safety   Hourly rounding   Assess for patients risk for elopement and implement Elopement Risk Plan per policy   Provide alternative method of communication if needed (communication boards, writing)  Goal: Patient will be free from infection during hospitalization  Outcome: Progressing  Flowsheets (Taken 07/17/2022 2155 by Creig Hines, RN)  Free from Infection during hospitalization:   Assess and monitor for signs and symptoms of infection   Monitor lab/diagnostic results     Problem: Pain  Goal: Pain at adequate level as identified by patient  Outcome: Progressing  Flowsheets (Taken 07/26/2022 0800)  Pain at adequate level as identified by patient:   Identify patient comfort function goal   Assess for risk of opioid induced respiratory depression, including snoring/sleep apnea. Alert healthcare team of risk factors identified.   Assess pain on admission, during daily assessment and/or before any "as needed" intervention(s)   Reassess pain within 30-60 minutes of any procedure/intervention, per Pain Assessment, Intervention, Reassessment (AIR) Cycle   Evaluate if patient comfort function goal is met   Evaluate patient's satisfaction with pain management progress   Offer non-pharmacological pain management interventions   Consult/collaborate with Pain Service   Consult/collaborate with Physical Therapy, Occupational Therapy, and/or Speech Therapy   Include patient/patient care companion in decisions related to pain management as needed     Problem: Side Effects from Pain Analgesia  Goal: Patient will experience minimal side effects of analgesic therapy  Outcome: Progressing  Flowsheets (Taken 07/26/2022 0800)  Patient will experience minimal side effects of analgesic therapy:   Monitor/assess patient's respiratory status (RR depth, effort, breath sounds)   Assess for changes in cognitive function   Prevent/manage side effects per LIP  orders (i.e. nausea, vomiting, pruritus, constipation, urinary retention, etc.)   Evaluate for opioid-induced sedation with appropriate assessment tool (i.e. POSS)     Problem: Hemodynamic Status: Cardiac  Goal: Stable vital signs and fluid balance  Outcome: Progressing  Flowsheets (Taken 07/24/2022 1549 by Peagler, Alene, RN)  Stable vital signs and fluid balance: Assess signs and symptoms associated with cardiac rhythm changes     Problem: Renal Instability  Goal: Fluid and electrolyte balance are achieved/maintained  Outcome: Progressing  Flowsheets (Taken 07/21/2022 1947 by Natale Lay, RN)  Fluid and electrolyte balance are achieved/maintained:   Monitor/assess lab values and report abnormal values   Assess and reassess fluid and electrolyte status   Observe for cardiac arrhythmias   Monitor for muscle weakness   Follow fluid restrictions/IV/PO parameters     Problem: Patient Receiving Advanced Renal Therapies  Goal: Therapy access site remains intact  Outcome: Progressing  Flowsheets (Taken 07/24/2022 1549 by Peagler, Fannie Knee, RN)  Therapy access site remains intact: Assess therapy access site     Problem: Fluid and Electrolyte Imbalance/ Endocrine  Goal: Fluid and electrolyte balance are achieved/maintained  Outcome: Progressing  Flowsheets (Taken 07/22/2022 1136 by Rogelia Rohrer, RN)  Fluid and electrolyte balance are achieved/maintained:   Monitor/assess lab values and report abnormal values   Assess and reassess fluid and electrolyte status   Observe for cardiac arrhythmias   Monitor for muscle weakness  Goal: Adequate hydration  Outcome: Progressing     Problem: Diabetes: Glucose Imbalance  Goal: Blood glucose stable at established goal  Outcome: Progressing  Flowsheets (Taken 07/24/2022 1549 by Ludwig Clarks, RN)  Blood glucose stable at established goal:   Monitor lab values   Assess for hypoglycemia /hyperglycemia   Monitor/assess vital signs   Ensure adequate hydration   Ensure patient/family has adequate  teaching materials     Problem: Inadequate Gas Exchange  Goal: Patent Airway maintained  Outcome: Progressing  Flowsheets (Taken 07/21/2022 1947 by Natale Lay, RN)  Patent airway maintained:   Provide adequate fluid intake to liquefy secretions   Reposition patient every 2 hours and as needed unless able to self-reposition   Suction secretions as needed  Goal: Adequate oxygenation and improved ventilation  Outcome: Progressing  Flowsheets (Taken 07/20/2022 1132 by Orbie Pyo, RN)  Adequate oxygenation and improved ventilation:   Assess lung sounds   Monitor SpO2 and treat as needed     Problem: Neurological Deficit  Goal: Neurological status is stable or improving  Outcome: Progressing  Flowsheets (Taken 07/22/2022 2019 by Mar Daring, RN)  Neurological status is stable or improving:   Monitor/assess/document neurological assessment (Stroke: every 4 hours)   Monitor/assess NIH Stroke Scale   Re-assess NIH Stroke Scale for any change in status   Observe for seizure activity and initiate seizure precautions if indicated   Perform CAM Assessment     Problem: Potential for Aspiration  Goal: Risk of aspiration will be minimized  Outcome: Progressing  Flowsheets (Taken 07/24/2022 1549 by Peagler, Fannie Knee, RN)  Risk of aspiration will be minimized:   Assess/monitor ability to swallow using dysphagia screen: Keep patient NPO if patient fails screening   Monitor/assess for signs of aspiration (tachypnea, cough, wheezing, clearing throat, hoarseness after eating, decrease in SaO2)   Consult/collaborate/follow recommended modified texture diet/thicken liquids as indicated by Speech Pathologist   Place patient up in chair to eat, if possible/head of bed up 90 degrees to eat if unable to be out of bed   Instruct patient to take small bites, small single sips of liquid, and do not use a straw     Problem: Every Day - Stroke  Goal: Mobility/Activity is maintained at optimal level for patient  Outcome: Progressing  Flowsheets  (Taken 07/24/2022 1549 by Peagler, Fannie Knee, RN)  Mobility/activity is maintained at optimal level for patient:   Encourage independent activity per ability   Consult/collaborate with Physical Therapy and/or Occupational Therapy  Goal: Elimination patterns are normal or improving  Outcome: Progressing  Flowsheets (Taken 07/21/2022 1947 by Natale Lay, RN)  Elimination patterns are normal or improving: Assess for and discuss C. diff screening with LIP  Goal: Neurological status is stable or improving  Outcome: Progressing  Flowsheets (Taken 07/22/2022 2019 by Mar Daring, RN)  Neurological status is stable or improving:   Monitor/assess/document neurological assessment (Stroke: every 4 hours)   Monitor/assess NIH Stroke Scale   Re-assess NIH Stroke Scale for any change in status   Observe for seizure activity and initiate seizure precautions if indicated   Perform CAM Assessment  Goal: Core/Quality measure requirements - Daily  Outcome: Progressing  Flowsheets (Taken 07/24/2022 1549 by Peagler, Fannie Knee, RN)  Core/Quality measure requirements - Daily: Continue stroke education (must include Modifiable Risk Factors, Warning Signs and Symptoms of Stroke, Activation of Emergency Medical System and Follow-up Appointments). Ensure handout has been given and documented.  Goal: Stable vital signs and fluid balance  Outcome: Progressing  Flowsheets (Taken 07/26/2022 0121 by Lanna Poche, RN)  Stable vital signs and fluid balance:   Apply telemetry monitor as ordered   Monitor and assess vitals every 4 hours or as ordered  and hemodynamic parameters  Goal: Patient will maintain adequate oxygenation  Outcome: Progressing  Flowsheets (Taken 07/22/2022 1136 by Rogelia Rohrer, RN)  Patient will maintain adequate oxygenation:   Suction secretions as needed   Maintain SpO2 of greater than 92%  Goal: Patient's risk of aspiration will be minimized  Outcome: Progressing  Flowsheets (Taken 07/21/2022 1947 by Natale Lay, RN)  Patient's risk  of aspiration will be minimized:   Complete new dysphagia screen for any change in status: Keep patient NPO if patient fails   Monitor/assess for signs of aspiration (tachypnea, cough, wheezing, clearing throat, hoarseness after eating, decrease in SaO2   Order modified texture diet as recommend by Speech Pathologist   Instruct patient to take small bites   Supervise patient during oral intake   Thicken liquids as recommended by Speech Pathologist   Keep head of bed up a minimum of 30 degrees when hemodynamically stable   Instruct patient to take small single sips of liquid   Do not use a straw   HOB up 90 degrees to eat if unable to be OOB   Place patient up in chair to eat, if possible   Assess and monitor ability to swallow   Consult/Collaborate with Speech Pathologist for dysphagia   Place swallow precaution signage above bed  Goal: Nutritional intake is adequate  Outcome: Progressing  Flowsheets (Taken 07/21/2022 1947 by Natale Lay, RN)  Nutritional intake is adequate:   Consult/collaborate with Clinical Nutritionist   Consult/collaborate with Speech Therapy (swallow evaluations)  Goal: Skin integrity is maintained or improved  Outcome: Progressing  Flowsheets (Taken 07/21/2022 1947 by Natale Lay, RN)  Skin integrity is maintained or improved:   Assess Braden Scale every shift   Turn or reposition patient every 2 hours or as needed unless able to reposition self   Keep skin clean and dry   Relieve pressure to bony prominences   Collaborate with Wound, Ostomy, and Continence Nurse   Avoid shearing   Utilize specialty bed   Keep head of bed 30 degrees or less (unless contraindicated)   Encourage use of lotion/moisturizer on skin   Monitor patient's hygiene practices   Increase activity as tolerated/progressive mobility  Goal: Neurovascular status is stable or improving  Outcome: Progressing  Flowsheets (Taken 07/21/2022 1947 by Natale Lay, RN)  Neurovascular status is stable or improving:    Monitor/assess neurovascular status (pulses, capillary refill, pain, paresthesia, presence of edema)   Monitor/assess for signs of Venous Thrombus Emboli (edema of calf/thigh redness, pain)   Monitor/assess site of invasive procedure for signs of bleeding  Goal: Effective coping demonstrated  Outcome: Progressing  Flowsheets (Taken 07/21/2022 1947 by Natale Lay, RN)  Effective coping demonstrated:   Assess/report to LIP uncontrolled anxiety, depression, or ineffective coping   Offer reassurance to decrease anxiety  Goal: Will be able to express needs and understand communication  Outcome: Progressing  Flowsheets (Taken 07/21/2022 1947 by Natale Lay, RN)  Able to express needs and understand communication:   Provide alternative method of communication if needed   Include patient care companion in decisions related to communication   Patient/patient care companion demonstrates understanding on disease process, treatment plan, medications and discharge plan   Consult/collaborate with Speech Language Pathology (SLP)   Consult/collaborate with Case Management/Social Work     Problem: Pain interferes with ability to perform ADL  Goal: Pain at adequate level as identified by patient  Outcome: Progressing  Flowsheets (Taken 07/26/2022 0800)  Pain at adequate level as identified  by patient:   Identify patient comfort function goal   Assess for risk of opioid induced respiratory depression, including snoring/sleep apnea. Alert healthcare team of risk factors identified.   Assess pain on admission, during daily assessment and/or before any "as needed" intervention(s)   Reassess pain within 30-60 minutes of any procedure/intervention, per Pain Assessment, Intervention, Reassessment (AIR) Cycle   Evaluate if patient comfort function goal is met   Evaluate patient's satisfaction with pain management progress   Offer non-pharmacological pain management interventions   Consult/collaborate with Pain Service   Consult/collaborate  with Physical Therapy, Occupational Therapy, and/or Speech Therapy   Include patient/patient care companion in decisions related to pain management as needed     Problem: Everyday - Heart Failure  Goal: Mobility/Activity is Maintained at Optimal Level for Patient  Outcome: Progressing  Flowsheets (Taken 07/23/2022 0201 by Mar Daring, RN)  Mobility/Activity is Maintained at Optimal Level for Patient:   Increase mobility as tolerated/progressive mobility protocol   Maintain SCD's as Ordered   Perform active/passive ROM   Reposition patient every 2 hours and as needed unless able to reposition self   Assess for changes in respiratory status, level of consciousness and/or development of fatigue   Consult with Physical Therapy and/or Occupational Therapy  Goal: Nutritional Intake is Adequate  Outcome: Progressing  Flowsheets (Taken 07/23/2022 0201 by Mar Daring, RN)  Nutritional Intake is Adequate:   Cardiac diet-2 gm Sodium   Fluid Restricction if needed   Consult/Collaborate with Nutritionist   Patient and family teaching on low sodium diet   Assess appetite,anorexia and amount of meal/food tolerated   Encourage/perform oral hygiene as appropriate

## 2022-07-26 NOTE — PT Progress Note (Signed)
Physical Therapy Treatment  [name]  Post Acute Care Therapy Recommendations   Discharge Recommendations:  SNF    DME needs IF patient is discharging home: No additional equipment/DME recommended at this time    Therapy discharge recommendations may change with patient status.  Please refer to most recent note for up-to-date recommendations.    Unit: [facility]  Bed: A2716/A2716-01    ___________________________________________________    Time of treatment:  PT Received On: 07/26/22  Start Time: 1425  Stop Time: 1459  Time Calculation (min): 34 min  Total Treatment Time (min): 17 (co tx with OT) to maximize safe and efficient transfers to EOB     Chart Review and Collaboration with Care Team: 5 minutes, not included in above time.    PT Visit Number: 3    ___________________________________________________    Precautions:   Precautions  Weight Bearing Status: no restrictions  Aspiration Precautions: see SLP recommendations  Fall Risks: High, Impaired balance/gait, Impaired mobility, Mental status change, Muscle weakness, History of fall(s)  Seizure Precautions: Yes (comment date of last seizure)  Communication Precautions: Expressive aphasia, Receptive aphasia  Other Precautions: skin & pressure ulcer; SBP 120-160; L side flaccid    Personal Protective Equipment (PPE)  gloves    Updated X-Rays/Tests/Labs:  Lab Results   Component Value Date/Time    HGB 8.7 (L) 07/26/2022 05:58 AM    HGB 8.6 (L) 06/03/2022 04:23 AM    HCT 26.6 (L) 07/26/2022 05:58 AM    HCT 26.8 (L) 06/03/2022 04:23 AM    K 4.0 07/26/2022 05:58 AM    K 3.6 06/03/2022 04:23 AM    NA 138 07/26/2022 05:58 AM    NA 137 06/03/2022 04:23 AM    INR 1.1 07/14/2022 04:35 AM    INR 1.1 06/01/2022 03:15 AM    TROPI 9.6 05/26/2022 06:37 PM    TROPI -2.2 05/26/2022 06:37 PM    TROPI 11.8 05/26/2022 03:26 PM       All imaging reviewed, please see chart for details.      Subjective:  "Aunt Maddy?"    Patient Goal:  pt does not state goal                Patient's medical condition is appropriate for Physical Therapy intervention at this time.  Patient is agreeable to participation in the therapy session. Family and/or guardian are agreeable to patient's participation in the therapy session.      Objective:  Observation of Patient/Vital Signs:  BP 146/66   Pulse (!) 56   Temp 98.1 F (36.7 C) (Oral)   Resp 19   Ht 1.575 m (5\' 2" )   Wt 86.9 kg (191 lb 9.3 oz)   SpO2 99%   BMI 35.04 kg/m   VSS t/o; initial BP taken supine prior to activity systolic read 172, afterwards Bps taken fell within range of 120-160     Cognition/Neuro Status  Arousal/Alertness: Delayed responses to stimuli  Attention Span: Difficulty attending to directions  Orientation Level: Oriented to person;Disoriented to time;Disoriented to situation;Disoriented to place  Memory: Unable to assess  Following Commands: Follows one step commands with repetition;25%  Safety Awareness: maximal verbal instruction  Insights: Educated in Engineer, building services  Problem Solving: dependent  Behavior: flat affect;cooperative;perseverative (perseverative on aunt's name Maddy throughout session)  Motor Planning: decreased initiation;decreased processing speed  Coordination: [facility] impaired;[facility] impaired    Musculoskeletal Examination         Functional Mobility  Rolling: Dependent;to Left;to Right  Supine to Sit: Maximal Assist;Increased Effort;Increased Time;to Right (x 2)  Scooting to HOB: Dependent  Scooting to EOB: Maximal Assist  Sit to Supine: Maximal Assist;to Left  Sit to Stand: Unable to assess (Comment) (poor sitting balance + weakness)     Locomotion  Ambulation: Unable to assess (Comment)  Distance Walked (ft) (Step 6,7): 0 Feet    Therapeutic Exercise  Knee AROM : attempted LAQs for extended period of time in sitting EOB, pt able to perform to complete ROM but only randomly/sporadically, and with significant verbal cueing     Neuro Re-Ed  Sitting Balance: with  support;maximal assist           Educated the Patient and Family to role of physical therapy, plan of care, goals of therapy and safety with mobility and ADLs, energy conservation techniques, discharge instructions, home safety with limited indication of understanding.  Additional education required..    Patient left in bed with alarm and all other medical equipment in place and call bell and all personal items/needs within reach.  RN notified of session outcome. CM team and attending have been previously notified of d/c recommendation; no updates to d/c recommendation since that time.      Assessment:  Pt follows commands for initiating RLE movement very sparingly. When not prompted observed to perform heelslides/knee flexion in supine position, and to lift R arm up to partial Rom. Sitting EOB pt requires maxA to maintain; performs LAQs for ~8-10 reps over a period of several minutes. Standing not attempted at this time 2/2 very poor balance and weakness. Pt remains suitable for [place] to SNF.         PMP Activity: Step 4 - Dangle at Bedside  Distance Walked (ft) (Step 6,7): 0 Feet    Plan:  Treatment/Interventions: Exercise, Gait training, Stair training, Neuromuscular re-education, Functional transfer training, LE strengthening/ROM, Endurance training, Cognitive reorientation, Patient/family training, Equipment eval/education, Bed mobility, Continued evaluation      PT Frequency: 2-3x/wk   Continue plan of care.    Goals:  Goals  Goal Formulation: With patient/family  Time for Goal Acheivement: By time of discharge  Goals: Select goal  Pt Will Go Supine To Sit: to maximize functional mobility and independence, with moderate assist, Not met  Pt Will Sit Edge of Bed: 1-2 min, to maximize functional mobility and independence, with moderate assist, Partly met  Pt Will Perform Sit to Stand: to maximize functional mobility and independence, with moderate assist, Not met  Pt Will Transfer Bed/Chair: with moderate assist, to  maximize functional mobility and independence, Not met  Pt Will Ambulate: 31-50 feet, with rolling walker, with moderate assist, to maximize functional mobility and independence, Not met      Ulice Dash, PT, DPT   [place] License #[id number]  [facility]  M 10-6:30, T 10-4:30, W 10-5:30, Th 10-8:30 F 11-8:30  Spectralink [id number]  07/26/2022 4:15 PM      [facility]  Patient: MESA MEHRHOFF MRN#: [id number]  Unit: [facility] Bed: A2716/A2716-01

## 2022-07-26 NOTE — Progress Notes (Signed)
Gave RN Alvy Beal report via phone at approx 2000. Pt transported with topical meds, wound vac, and tube feed to room 2516A. Pt family friend, Jerrye Beavers, at bedside and updated pt daughter of new room and pt condition. Bedside handoff of NIH performed with RN on unit 25. Smart guard and pt records transferred along with pt and noted to RN receiving pt.

## 2022-07-27 LAB — CBC
Absolute nRBC: 0 10*3/uL (ref <=0.00)
Hematocrit: 25.2 % — ABNORMAL LOW (ref 34.7–43.7)
Hemoglobin: 8.5 g/dL — ABNORMAL LOW (ref 11.4–14.8)
MCH: 30.4 pg (ref 25.1–33.5)
MCHC: 33.7 g/dL (ref 31.5–35.8)
MCV: 90 fL (ref 78.0–96.0)
MPV: 9.8 fL (ref 8.9–12.5)
Platelet Count: 436 10*3/uL — ABNORMAL HIGH (ref 142–346)
RBC: 2.8 10*6/uL — ABNORMAL LOW (ref 3.90–5.10)
RDW: 17 % — ABNORMAL HIGH (ref 11–15)
WBC: 11.67 10*3/uL — ABNORMAL HIGH (ref 3.10–9.50)
nRBC %: 0 /100 WBC (ref <=0.0)

## 2022-07-27 LAB — BASIC METABOLIC PANEL
Anion Gap: 16 — ABNORMAL HIGH (ref 5.0–15.0)
BUN: 61 mg/dL — ABNORMAL HIGH (ref 7–21)
CO2: 23 mEq/L (ref 17–29)
Calcium: 8.9 mg/dL (ref 7.9–10.2)
Chloride: 99 mEq/L (ref 99–111)
Creatinine: 4.7 mg/dL — ABNORMAL HIGH (ref 0.4–1.0)
GFR: 8.6 mL/min/{1.73_m2} — ABNORMAL LOW (ref >=60.0)
Glucose: 182 mg/dL — ABNORMAL HIGH (ref 70–100)
Potassium: 4 mEq/L (ref 3.5–5.3)
Sodium: 138 mEq/L (ref 135–145)

## 2022-07-27 LAB — WHOLE BLOOD GLUCOSE POCT
Whole Blood Glucose POCT: 108 mg/dL — ABNORMAL HIGH (ref 70–100)
Whole Blood Glucose POCT: 136 mg/dL — ABNORMAL HIGH (ref 70–100)
Whole Blood Glucose POCT: 138 mg/dL — ABNORMAL HIGH (ref 70–100)
Whole Blood Glucose POCT: 142 mg/dL — ABNORMAL HIGH (ref 70–100)
Whole Blood Glucose POCT: 185 mg/dL — ABNORMAL HIGH (ref 70–100)

## 2022-07-27 LAB — MAGNESIUM: Magnesium: 2 mg/dL (ref 1.6–2.6)

## 2022-07-27 LAB — PHOSPHORUS: Phosphorus: 3.2 mg/dL (ref 2.3–4.7)

## 2022-07-27 MED ORDER — SODIUM CHLORIDE 0.9 % IV BOLUS
250.0000 mL | INTRAVENOUS | Status: AC | PRN
Start: 2022-07-27 — End: 2022-07-27

## 2022-07-27 MED ORDER — ALBUMIN HUMAN 25 % IV SOLN
100.0000 mL | INTRAVENOUS | Status: AC | PRN
Start: 2022-07-27 — End: 2022-07-27
  Administered 2022-07-27: 25 g via INTRAVENOUS
  Filled 2022-07-27: qty 100

## 2022-07-27 MED ORDER — SODIUM CHLORIDE 0.9 % IV BOLUS
100.0000 mL | INTRAVENOUS | Status: AC | PRN
Start: 2022-07-27 — End: 2022-07-27

## 2022-07-27 NOTE — Plan of Care (Signed)
Transfer patient (Order [id number])     Downgrade orders are in.  Patient still in dialysis at this time.  Report called in to Herma Ard RN of Unit [address]  [facility] Dialysis RN notified of the downgrade and will transfer patient to 2323A after Hemodialysis inpatient (Order [id number]). All questions/concerns addressed.

## 2022-07-27 NOTE — Plan of Care (Signed)
Problem: Renal Instability  Goal: Fluid and electrolyte balance are achieved/maintained  Outcome: Progressing  Flowsheets (Taken 07/27/2022 1016)  Fluid and electrolyte balance are achieved/maintained:   Monitor for muscle weakness   Assess and reassess fluid and electrolyte status   Monitor/assess lab values and report abnormal values   Observe for cardiac arrhythmias   Follow fluid restrictions/IV/PO parameters     Problem: Patient Receiving Advanced Renal Therapies  Goal: Therapy access site remains intact  Outcome: Progressing  Flowsheets (Taken 07/27/2022 1016)  Therapy access site remains intact:   Assess therapy access site   Change therapy access site dressing as needed

## 2022-07-27 NOTE — Progress Notes (Signed)
Arrived in HD room, pt is alert and awake. R permanent catheter access is optimally working. Report received from primary RN pre-dialysis. Timeouts and safety checks done. Will closely monitor the pt.       07/27/22 0925   Bedside Nurse Communication   Name of bedside RN - pre dialysis Daiva Nakayama, RN   Treatment Initiation- With Dialysis Precautions   Time Out/Safety Check Completed Yes   Consent for HD signed for this hospitalization (Date) 06/01/22   Consent for HD signed for this hospitalization (Time) 1516   Blood Consent Verified N/A   Dialysis Precautions All Connections Secured;Saline Line Double Clamped;Venous Parameters Set;Arterial Parameters Set;Air Foam Detecctor Engaged   Dialysis Treatment Type Routine   Is patient diabetic? Yes   RO/Hemodialysis Cabin crew   Is Total Chlorine less than 0.1 ppm? Yes   Orignial Total Chlorine Testing Time 0845   At 4 Hour Total Chlorine Testing Time 1245   RO/Hemodialysis Arts development officer Number 4    Machine Serial Number [id number]   RO # 20   RO Serial # [id number]   pH 7.2   Pressure Test Verified Yes   Alarms Verified Passed   Machine Temperature 98.6 F (37 C)   Alarms Verified Yes   Na+ mEq (Machine) 138 mEq   Bicarb mEq (Machine) 35 mEq   Hemodialysis Conductivity (Machine) 13.8   Hemodialysis Conductivity (Meter) 13.8   Dialyzer Lot Number [id number]   Tubing Lot Number [id number]   RO Machine Log Completed Yes   Hepatitis Status   HBsAg (Antigen) Result Negative   HBsAg Date Drawn 07/03/22   HBsAg Repeat Draw Due Date 07/31/22   Dialysis Weight   Pre-Treatment Weight (Kg) 95.2   Scale Type ICU Bed Scale   Vitals   Heart Rate 63   Resp Rate 20   BP 136/63   SpO2 98 %   O2 Device None (Room air)   Assessment   Mental Status Other (Comment)  (aleert and awake)   Cardiac Regularity Return to [facility]   Cardiac Rhythm Normal sinus rhythm   Respiratory Pattern Retractions   Bilateral Breath Sounds Diminished   R Breath Sounds Diminished   L Breath  Sounds Diminished   RLE Edema Non Pitting Edema   LLE Edema Non Pitting Edema   General Skin Color Appropriate for ethnicity   Skin Condition/Temp Return to [facility]   Abdomen Inspection Rounded   Mobility Bed   Permacath Catheter - Tunneled 07/14/22 Subclavian vein catheter Right   Placement Date/Time: 07/14/22 1404   Inserted by: Dr. Welton Flakes  Access Type: Subclavian vein catheter  Orientation: Right  Central Line Infection Prevention Education provided?: Yes  Hand Hygiene: Alcohol based hand scrub;Soap and water  Line cart u...   Line necessity reviewed? Apheresis/hemodialysis   Site Assessment Clean;Dry;Intact   Catheter Lumen Volume Venous 1.6 mL   Catheter Lumen Volume Arterial 1.6 mL   Dressing Status and Intervention Dressing Intact;Clean & Dry   Pain Assessment   Charting Type Assessment   Pain Scale Used PAINAD (Advanced Dementia)   Hemodialysis Comments   Pre-Hemodialysis Comments Timeout and safety nchecks done

## 2022-07-27 NOTE — Plan of Care (Signed)
Problem: Pain  Goal: Pain at adequate level as identified by patient  Flowsheets (Taken 07/27/2022 0800)  Pain at adequate level as identified by patient:   Identify patient comfort function goal   Assess for risk of opioid induced respiratory depression, including snoring/sleep apnea. Alert healthcare team of risk factors identified.   Assess pain on admission, during daily assessment and/or before any "as needed" intervention(s)         RN thoroughly assessed patient's learning abilities.    Patient Status: Patient has no complaints of pain at this time ( 0/10 ). No evidence of grimace and any other signs of pain nor any kind of discomfort.     Individualized Plan of Care for today:  Continue to monitor pain level and medicate if needed. RN will continue to monitor pain status. Patient educated about about pain management and was asked to seek assistance if necessary. Patient was notified that the RN will conduct a purposeful hourly rounding through out the entire shift to attend her needs. Hand out for pain management was given to the patient and explained on layman's term. Today's plan of care and goals were discussed with the patient, who agrees to it and was able to verbalize and demonstrate understanding of the disease process, treatment plan, medications and consequences of noncompliance. All questions and concerns were addressed.     Significant plans for this shift:     Nocturnal TF  Wound care  WoundVac care  Q6 blood glucose checks  HD T TH S  Neuro checks with vitals q4

## 2022-07-27 NOTE — SLP Progress Note (Signed)
[facility]  Speech Therapy Treatment Note    Patient:  Shelby Barron MRN#:  [id number]  Unit:  [facility] [address] [facility] Room/Bed:  V4098/J1914.A      Time of treatment:   SLP Received On: 07/27/22  Start Time: 1430  Stop Time: 1500  Time Calculation (min): 30 min       Personal Protective Equipment (PPE)  Gloves and Procedure Mask with Shield    Subjective:   Pt is alert/ verbal, confused and unable to follow commands seen with both lunch and breakfast         Objective: Oropharyngeal analysis during PO trials in order to determine LRD,development and training of swallow strategies in order to improve swallow function and decrease risk of penetration/aspiration;  therapeutic feedings by SLP  and safe swallowing precautions education.       Assessment:  pt presents with mild oral pharyngeal dysphagia characterized by delay in bolus transit , delay in pharyngeal swallow trigger and fairly adequate HLE upon subjective digital palpation. Pt tolerated 50% of breakfast , 50% of lunch  and 3 fl oz of nectar thickened liquid via straw without s/s overt aspiration or penetration         Plan/Recommendations:  IDDSI Pureed - PU4  and Mildly thick  MT2 initiate the meal with liquid to improve participation.   Slow feeding rate, small bites / sips , upright position during and after the meals , Alternate solid and liquid , Reflux precautions , full assistance with meals , liquid wash, and check oral cavity for residue         Charges Minutes   SLP treat    Swallow treat 30       07/27/2022  Presley Raddle MS [facility] SLP   07/27/2022 3:11 PM  [phone]

## 2022-07-27 NOTE — Plan of Care (Signed)
All patient belongings was moved to patient's new room 2323A.

## 2022-07-27 NOTE — Progress Notes (Signed)
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 s/p bilateral radical nephrectomies and  cystectomy on 06/28/22  - complicated by small bowel enterotomy and blood loss   New onset afib - on amiodarone and eliquis  High grade urothelial bladder CA: s/p bilateral nephrectomies and cystectomy c/b mall bowel enterotomy and blood loss (~1L) on 06/28/22  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 - remains low, adequate iron - on aranesp  Encephalopathy - same  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   Hypophosphatemia -  better  HTN - BP high     Recommendations:   S/p Hd today with 1.8L net UF given soft BP  Monitor BP   Next HD on Thursday    Case discussed with: patient, friend at bedside    Delice Bison, MD  [facility]  [phone] (office)  [phone] (FFX Spectra-Link)      Subjective: resting      Review of Systems:   Resting     Physical Exam:     Vitals:    07/27/22 1245 07/27/22 1300 07/27/22 1303 07/27/22 1400   BP: 121/61 120/55 138/64 146/62   Pulse: 69 68 78 80   Resp:  18 18 19    Temp:   97.9 F (36.6 C) 98.6 F (37 C)   TempSrc:    Oral   SpO2: 100% 100% 100% 98%   Weight:       Height:           Intake and Output Summary (Last 24 hours) at Date Time    Intake/Output Summary (Last 24 hours) at 07/27/2022 1533  Last data filed at 07/27/2022 1303  Gross per 24 hour   Intake 15 ml   Output 1800 ml   Net -1785 ml         General: resting  Cardiovascular: regular rate and rhythm  Lungs: bilateral air entry  Abdomen:  soft  Extremities: no edema  Access: R permacath     Meds:      Scheduled Meds: PRN Meds:    acetaminophen, 650 mg, Oral, 4 times per day   Or  acetaminophen, 650 mg, Oral, 4 times per day  amiodarone, 200 mg, per NG tube, Daily  apixaban, 2.5 mg, Oral, Q12H SCH  carvedilol, 12.5 mg, Oral, Q12H SCH  darbepoetin alfa, 0.45 mcg/kg, Subcutaneous, Weekly  glycopyrrolate, 0.2 mg, Intravenous, Q8H  insulin glargine, 7 Units, Subcutaneous, QAM  insulin lispro, 1-8 Units, Subcutaneous, Q6H  pantoprazole, 40 mg, per NG tube, Q12H SCH  thiamine, 100 mg, per NG tube, Daily          Continuous Infusions:       sodium chloride, , PRN  acetaminophen, 650 mg, Q6H PRN  albuterol-ipratropium, 3 mL, Q6H PRN  benzocaine, 1 spray, TID PRN  dextrose, 15 g of glucose, PRN   Or  dextrose, 12.5 g, PRN   Or  dextrose, 12.5 g, PRN  Or  glucagon (rDNA), 1 mg, PRN  diphenhydrAMINE, 25 mg, Q6H PRN  hydrALAZINE, 10 mg, Q3H PRN  labetalol, 10 mg, Q15 Min PRN  lidocaine 2% jelly, , Once PRN  naloxone, 0.2 mg, PRN  ondansetron, 4 mg, Q6H PRN  oxyCODONE, 2.5 mg, Q4H PRN  phenol, 1 spray, Q2H PRN              Labs:     Recent Labs   Lab 07/27/22  0338 07/26/22  0558 07/25/22  1421   WBC 11.67* 13.02* 12.91*   Hemoglobin 8.5* 8.7* 6.7*   Hematocrit 25.2* 26.6* 21.0*   Platelet Count 436* 445* 405*     Recent Labs   Lab 07/27/22  0338 07/26/22  0558 07/25/22  0351   Sodium 138 138 137   Potassium 4.0 4.0 3.7   Chloride 99 99 100   CO2 23 23 23    BUN 61* 45* 29*   Creatinine 4.7* 3.8* 2.5*   Calcium 8.9 8.7 8.0   Phosphorus 3.2 2.9 2.3   Magnesium 2.0 2.0 1.8   Glucose 182* 170* 163*   GFR 8.6* 11.1* 18.4*           Signed by: Delice Bison, MD, FASN

## 2022-07-27 NOTE — Plan of Care (Signed)
Patient OFF THE FLOOR ( [id number] ) at this time for Hemodialysis inpatient (Order [id number]) via bed.

## 2022-07-27 NOTE — Progress Notes (Signed)
[facility] Diseases [facility]  Progress Note    07/27/2022   Shelby Barron ZOX:[id number] is a 82 y.o. female,       Assessment:     Encephalopathy  Acute CVA  CT scan of the head-no significant change in multifocal areas of subacute infarction, unchanged small area of petechial hemorrhage in left parietal and occipital lobes (07/26/2022)  History of bladder cancer and renal failure  Status post radical cystectomy/bilateral nephrectomy, enterectomy and lysis of adhesion (06/28/2022)  S/p small bowel resection  CT scan of abdomen and pelvis-no abscess or acute inflammatory process (07/19/2022)  Leukocytosis improving  End-stage renal disease on hemodialysis  Atrial fibrillation  Risk of aspiration    Plan:     Continue monitoring without antibiotics  Continue local wound care  Follow-up chest x-ray  Continue probiotics  Monitor electrolytes and renal functions closely  Monitor clinically    ROS:     General: No fever, sleepy, weak, receiving hemodialysis  HEENT: NG tube in place  Respiratory: No cough or shortness of breath  Gastrointestinal: Abdominal wall wound/wound VAC in place  Genito-Urinary: no hematuria  Musculoskeletal: no edema  Neurological: Sleepy  Dermatological: No rash    Physical Examination:     Blood pressure 155/84, pulse 66, temperature 98.6 F (37 C), temperature source Axillary, resp. rate 20, height 1.575 m (5\' 2" ), weight 95.2 kg (209 lb 14.1 oz), SpO2 96%.    General Appearance: Appears comfortable  HEENT: Pupils are equal, round, and reactive to light.  NG tube in place  Lungs: Clear to auscultation  Heart: Regular rate and rhythm  Chest: Symmetric chest wall expansion.   Abdomen: soft, no hepatosplenomegaly, no bowel sounds, abdominal wall wound   Neurological: Sleepy  Extremities: No edema    Laboratory And Diagnostic Studies:     Recent Labs     07/27/22  0338 07/26/22  0558   WBC 11.67* 13.02*   Hemoglobin 8.5* 8.7*   Hematocrit 25.2* 26.6*   Platelet Count 436*  445*     Recent Labs     07/27/22  0338 07/26/22  0558   Sodium 138 138   Potassium 4.0 4.0   Chloride 99 99   CO2 23 23   BUN 61* 45*   Creatinine 4.7* 3.8*   Glucose 182* 170*   Calcium 8.9 8.7     No results for input(s): "AST", "ALT", "ALKPHOS", "PROT", "ALB" in the last 72 hours.      Current Meds:      Scheduled Meds: PRN Meds:    acetaminophen, 650 mg, Oral, 4 times per day   Or  acetaminophen, 650 mg, Oral, 4 times per day  amiodarone, 200 mg, per NG tube, Daily  apixaban, 2.5 mg, Oral, Q12H SCH  carvedilol, 12.5 mg, Oral, Q12H SCH  darbepoetin alfa, 0.45 mcg/kg, Subcutaneous, Weekly  glycopyrrolate, 0.2 mg, Intravenous, Q8H  insulin glargine, 7 Units, Subcutaneous, QAM  insulin lispro, 1-8 Units, Subcutaneous, Q6H  pantoprazole, 40 mg, per NG tube, Q12H SCH  thiamine, 100 mg, per NG tube, Daily        Continuous Infusions:     sodium chloride, , PRN  acetaminophen, 650 mg, Q6H PRN  albumin human, 100 mL, PRN  albuterol-ipratropium, 3 mL, Q6H PRN  benzocaine, 1 spray, TID PRN  dextrose, 15 g of glucose, PRN   Or  dextrose, 12.5 g, PRN   Or  dextrose, 12.5 g, PRN   Or  glucagon (rDNA), 1 mg, PRN  diphenhydrAMINE, 25 mg, Q6H PRN  hydrALAZINE, 10 mg, Q3H PRN  labetalol, 10 mg, Q15 Min PRN  lidocaine 2% jelly, , Once PRN  naloxone, 0.2 mg, PRN  ondansetron, 4 mg, Q6H PRN  oxyCODONE, 2.5 mg, Q4H PRN  phenol, 1 spray, Q2H PRN  sodium chloride, 100 mL, Q1H PRN  sodium chloride, 250 mL, PRN          Yuval Nolet A. Janalyn Rouse, M.D.  07/27/2022  9:39 AM

## 2022-07-27 NOTE — Plan of Care (Signed)
Problem: Moderate/High Fall Risk Score >5  Goal: Patient will remain free of falls  Outcome: Progressing  Flowsheets (Taken 07/27/2022 2000)  High (Greater than 13):   HIGH-Consider use of low bed   HIGH-Bed alarm on at all times while patient in bed     Problem: Compromised Sensory Perception  Goal: Sensory Perception Interventions  Outcome: Progressing  Flowsheets (Taken 07/27/2022 2000)  Sensory Perception Interventions: Offload heels, Pad bony prominences, Reposition q 2hrs/turn Clock, Q2 hour skin assessment under devices if present     Problem: Compromised Moisture  Goal: Moisture level Interventions  Outcome: Progressing  Flowsheets (Taken 07/27/2022 2000)  Moisture level Interventions: Moisture wicking products, Moisture barrier cream     Problem: Compromised Activity/Mobility  Goal: Activity/Mobility Interventions  Outcome: Progressing  Flowsheets (Taken 07/27/2022 2000)  Activity/Mobility Interventions: Pad bony prominences, TAP Seated positioning system when OOB, Promote PMP, Reposition q 2 hrs / turn clock, Offload heels     Problem: Compromised Nutrition  Goal: Nutrition Interventions  Outcome: Progressing  Flowsheets (Taken 07/27/2022 2000)  Nutrition Interventions: Discuss nutrition at Rounds, I&Os, Document % meal eaten, Daily weights     Problem: Compromised Friction/Shear  Goal: Friction and Shear Interventions  Outcome: Progressing  Flowsheets (Taken 07/27/2022 2000)  Friction and Shear Interventions: Pad bony prominences, Off load heels, HOB 30 degrees or less unless contraindicated, Consider: TAP seated positioning, Heel foams     Problem: Inadequate Airway Clearance  Goal: Patent Airway maintained  Outcome: Progressing  Flowsheets (Taken 07/21/2022 1947 by Natale Lay, RN)  Patent airway maintained:   Provide adequate fluid intake to liquefy secretions   Reposition patient every 2 hours and as needed unless able to self-reposition   Suction secretions as needed  Goal: Normal respiratory rate/effort  achieved/maintained  Outcome: Progressing  Flowsheets (Taken 07/24/2022 1549 by Peagler, Alene, RN)  Normal respiratory rate/effort achieved/maintained: Plan activities to conserve energy: plan rest periods     Problem: Infection Prevention  Goal: Free from infection  Outcome: Progressing  Flowsheets (Taken 07/22/2022 2019 by Mar Daring, RN)  Free from infection:   Monitor/assess vital signs   Encourage/assist patient to turn, cough and perform deep breathing every 2 hours   Assess incision for evidence of healing   Monitor/assess output from surgical drain if present   Monitor/assess lab values and report abnormal values   Assess for signs and symptoms of infection     Problem: Impaired Mobility  Goal: Mobility/Activity is maintained at optimal level for patient  Outcome: Progressing  Flowsheets (Taken 07/24/2022 1549 by Peagler, Fannie Knee, RN)  Mobility/activity is maintained at optimal level for patient:   Encourage independent activity per ability   Consult/collaborate with Physical Therapy and/or Occupational Therapy     Problem: Constipation  Goal: Fluid and electrolyte balance are achieved/maintained  Outcome: Progressing  Flowsheets (Taken 07/27/2022 0130 by Delphia Grates, Mireida, RN)  Fluid and electrolyte balance are achieved/maintained:   Monitor/assess lab values and report abnormal values   Assess and reassess fluid and electrolyte status   Observe for cardiac arrhythmias   Monitor for muscle weakness  Goal: Elimination patterns are normal or improving  Outcome: Progressing  Flowsheets (Taken 07/21/2022 1947 by Natale Lay, RN)  Elimination patterns are normal or improving: Assess for and discuss C. diff screening with LIP     Problem: Safety  Goal: Patient will be free from injury during hospitalization  Outcome: Progressing  Flowsheets (Taken 07/27/2022 0130 by Delphia Grates, Mireida, RN)  Patient will be free from  injury during hospitalization:   Assess patient's risk for falls and implement fall prevention  plan of care per policy   Provide and maintain safe environment   Use appropriate transfer methods   Ensure appropriate safety devices are available at the bedside   Include patient/ family/ care giver in decisions related to safety   Hourly rounding  Goal: Patient will be free from infection during hospitalization  Outcome: Progressing  Flowsheets (Taken 07/27/2022 0130 by Delphia Grates, Mireida, RN)  Free from Infection during hospitalization:   Assess and monitor for signs and symptoms of infection   Monitor lab/diagnostic results   Monitor all insertion sites (i.e. indwelling lines, tubes, urinary catheters, and drains)   Encourage patient and family to use good hand hygiene technique     Problem: Pain  Goal: Pain at adequate level as identified by patient  Outcome: Progressing  Flowsheets (Taken 07/27/2022 0800 by Lavina Hamman, RN)  Pain at adequate level as identified by patient:   Identify patient comfort function goal   Assess for risk of opioid induced respiratory depression, including snoring/sleep apnea. Alert healthcare team of risk factors identified.   Assess pain on admission, during daily assessment and/or before any "as needed" intervention(s)     Problem: Side Effects from Pain Analgesia  Goal: Patient will experience minimal side effects of analgesic therapy  Outcome: Progressing  Flowsheets (Taken 07/27/2022 0130 by Delphia Grates, Mireida, RN)  Patient will experience minimal side effects of analgesic therapy:   Monitor/assess patient's respiratory status (RR depth, effort, breath sounds)   Assess for changes in cognitive function   Prevent/manage side effects per LIP orders (i.e. nausea, vomiting, pruritus, constipation, urinary retention, etc.)   Evaluate for opioid-induced sedation with appropriate assessment tool (i.e. POSS)     Problem: Hemodynamic Status: Cardiac  Goal: Stable vital signs and fluid balance  Outcome: Progressing  Flowsheets (Taken 07/27/2022 0130 by Delphia Grates, Mireida,  RN)  Stable vital signs and fluid balance:   Assess signs and symptoms associated with cardiac rhythm changes   Monitor lab values     Problem: Renal Instability  Goal: Fluid and electrolyte balance are achieved/maintained  Outcome: Progressing  Flowsheets (Taken 07/27/2022 1016 by Bunnie Philips, RN)  Fluid and electrolyte balance are achieved/maintained:   Monitor for muscle weakness   Assess and reassess fluid and electrolyte status   Monitor/assess lab values and report abnormal values   Observe for cardiac arrhythmias   Follow fluid restrictions/IV/PO parameters     Problem: Patient Receiving Advanced Renal Therapies  Goal: Therapy access site remains intact  Outcome: Progressing  Flowsheets (Taken 07/27/2022 1016 by Bunnie Philips, RN)  Therapy access site remains intact:   Assess therapy access site   Change therapy access site dressing as needed     Problem: Fluid and Electrolyte Imbalance/ Endocrine  Goal: Fluid and electrolyte balance are achieved/maintained  Outcome: Progressing  Flowsheets (Taken 07/27/2022 0130 by Delphia Grates, Mireida, RN)  Fluid and electrolyte balance are achieved/maintained:   Monitor/assess lab values and report abnormal values   Assess and reassess fluid and electrolyte status   Observe for cardiac arrhythmias   Monitor for muscle weakness  Goal: Adequate hydration  Outcome: Progressing  Flowsheets (Taken 07/27/2022 0130 by Delphia Grates, Mireida, RN)  Adequate hydration:   Assess mucus membranes, skin color, turgor, perfusion and presence of edema   Assess for peripheral, sacral, periorbital and abdominal edema   Monitor and assess vital signs and perfusion  Problem: Diabetes: Glucose Imbalance  Goal: Blood glucose stable at established goal  Outcome: Progressing  Flowsheets (Taken 07/24/2022 1549 by Peagler, Fannie Knee, RN)  Blood glucose stable at established goal:   Monitor lab values   Assess for hypoglycemia /hyperglycemia   Monitor/assess vital signs   Ensure adequate hydration   Ensure  patient/family has adequate teaching materials     Problem: Inadequate Gas Exchange  Goal: Patent Airway maintained  Outcome: Progressing  Flowsheets (Taken 07/21/2022 1947 by Natale Lay, RN)  Patent airway maintained:   Provide adequate fluid intake to liquefy secretions   Reposition patient every 2 hours and as needed unless able to self-reposition   Suction secretions as needed  Goal: Adequate oxygenation and improved ventilation  Outcome: Progressing  Flowsheets (Taken 07/20/2022 1132 by Orbie Pyo, RN)  Adequate oxygenation and improved ventilation:   Assess lung sounds   Monitor SpO2 and treat as needed     Problem: Neurological Deficit  Goal: Neurological status is stable or improving  Outcome: Progressing  Flowsheets (Taken 07/27/2022 0130 by Delphia Grates, Mireida, RN)  Neurological status is stable or improving:   Monitor/assess/document neurological assessment (Stroke: every 4 hours)   Monitor/assess NIH Stroke Scale   Re-assess NIH Stroke Scale for any change in status   Perform CAM Assessment     Problem: Potential for Aspiration  Goal: Risk of aspiration will be minimized  Outcome: Progressing  Flowsheets (Taken 07/27/2022 0130 by Delphia Grates, Mireida, RN)  Risk of aspiration will be minimized:   Assess/monitor ability to swallow using dysphagia screen: Keep patient NPO if patient fails screening   Monitor/assess for signs of aspiration (tachypnea, cough, wheezing, clearing throat, hoarseness after eating, decrease in SaO2)     Problem: Every Day - Stroke  Goal: Mobility/Activity is maintained at optimal level for patient  Outcome: Progressing  Flowsheets (Taken 07/24/2022 1549 by Peagler, Fannie Knee, RN)  Mobility/activity is maintained at optimal level for patient:   Encourage independent activity per ability   Consult/collaborate with Physical Therapy and/or Occupational Therapy  Goal: Elimination patterns are normal or improving  Outcome: Progressing  Flowsheets (Taken 07/21/2022 1947 by Natale Lay,  RN)  Elimination patterns are normal or improving: Assess for and discuss C. diff screening with LIP  Goal: Neurological status is stable or improving  Outcome: Progressing  Flowsheets (Taken 07/27/2022 0130 by Delphia Grates, Mireida, RN)  Neurological status is stable or improving:   Monitor/assess/document neurological assessment (Stroke: every 4 hours)   Monitor/assess NIH Stroke Scale   Re-assess NIH Stroke Scale for any change in status   Perform CAM Assessment  Goal: Core/Quality measure requirements - Daily  Outcome: Progressing  Flowsheets (Taken 07/27/2022 0130 by Delphia Grates, Mireida, RN)  Core/Quality measure requirements - Daily:   VTE Prevention: Ensure anticoagulant(s) administered and/or anti-embolism stockings/devices documented by end of day 2   Ensure antithrombotic administered or contraindication documented by LIP by end of day 2   Once lipid panel has resulted, check LDL. Contact provider for statin order if LDL > 70 (or ensure contraindication documented by LIP).   Continue stroke education (must include Modifiable Risk Factors, Warning Signs and Symptoms of Stroke, Activation of Emergency Medical System and Follow-up Appointments). Ensure handout has been given and documented.  Goal: Stable vital signs and fluid balance  Outcome: Progressing  Flowsheets (Taken 07/27/2022 0130 by Delphia Grates, Mireida, RN)  Stable vital signs and fluid balance:   Position patient for maximum circulation/cardiac output   Monitor and assess vitals every  4 hours or as ordered and hemodynamic parameters   Monitor intake and output. Notify LIP if urine output is < 30 mL/hour.   Apply telemetry monitor as ordered  Goal: Patient will maintain adequate oxygenation  Outcome: Progressing  Flowsheets (Taken 07/22/2022 1136 by Rogelia Rohrer, RN)  Patient will maintain adequate oxygenation:   Suction secretions as needed   Maintain SpO2 of greater than 92%  Goal: Patient's risk of aspiration will be minimized  Outcome:  Progressing  Flowsheets (Taken 07/27/2022 0130 by Delphia Grates, Mireida, RN)  Patient's risk of aspiration will be minimized:   Complete new dysphagia screen for any change in status: Keep patient NPO if patient fails   Monitor/assess for signs of aspiration (tachypnea, cough, wheezing, clearing throat, hoarseness after eating, decrease in SaO2   Assess and monitor ability to swallow   Keep head of bed up a minimum of 30 degrees when hemodynamically stable   Thicken liquids as recommended by Speech Pathologist   Order modified texture diet as recommend by Speech Pathologist   HOB up 90 degrees to eat if unable to be OOB   Consult/Collaborate with Speech Pathologist for dysphagia   Supervise patient during oral intake  Goal: Nutritional intake is adequate  Outcome: Progressing  Flowsheets (Taken 07/21/2022 1947 by Natale Lay, RN)  Nutritional intake is adequate:   Consult/collaborate with Clinical Nutritionist   Consult/collaborate with Speech Therapy (swallow evaluations)  Goal: Skin integrity is maintained or improved  Outcome: Progressing  Flowsheets (Taken 07/27/2022 0130 by Delphia Grates, Mireida, RN)  Skin integrity is maintained or improved:   Assess Braden Scale every shift   Turn or reposition patient every 2 hours or as needed unless able to reposition self   Increase activity as tolerated/progressive mobility   Relieve pressure to bony prominences   Avoid shearing   Keep skin clean and dry   Encourage use of lotion/moisturizer on skin   Monitor patient's hygiene practices   Collaborate with Wound, Ostomy, and Continence Nurse   Keep head of bed 30 degrees or less (unless contraindicated)  Goal: Neurovascular status is stable or improving  Outcome: Progressing  Flowsheets (Taken 07/27/2022 0130 by Delphia Grates, Mireida, RN)  Neurovascular status is stable or improving:   Monitor/assess neurovascular status (pulses, capillary refill, pain, paresthesia, presence of edema)   Monitor/assess for signs of Venous  Thrombus Emboli (edema of calf/thigh redness, pain)   Monitor/assess site of invasive procedure for signs of bleeding  Goal: Effective coping demonstrated  Outcome: Progressing  Flowsheets (Taken 07/21/2022 1947 by Natale Lay, RN)  Effective coping demonstrated:   Assess/report to LIP uncontrolled anxiety, depression, or ineffective coping   Offer reassurance to decrease anxiety  Goal: Will be able to express needs and understand communication  Outcome: Progressing  Flowsheets (Taken 07/21/2022 1947 by Natale Lay, RN)  Able to express needs and understand communication:   Provide alternative method of communication if needed   Include patient care companion in decisions related to communication   Patient/patient care companion demonstrates understanding on disease process, treatment plan, medications and discharge plan   Consult/collaborate with Speech Language Pathology (SLP)   Consult/collaborate with Case Management/Social Work     Problem: Pain interferes with ability to perform ADL  Goal: Pain at adequate level as identified by patient  Outcome: Progressing  Flowsheets (Taken 07/27/2022 0800 by Lavina Hamman, RN)  Pain at adequate level as identified by patient:   Identify patient comfort function goal   Assess for risk  of opioid induced respiratory depression, including snoring/sleep apnea. Alert healthcare team of risk factors identified.   Assess pain on admission, during daily assessment and/or before any "as needed" intervention(s)     Problem: Everyday - Heart Failure  Goal: Mobility/Activity is Maintained at Optimal Level for Patient  Outcome: Progressing  Flowsheets (Taken 07/27/2022 0130 by Delphia Grates, Mireida, RN)  Mobility/Activity is Maintained at Optimal Level for Patient:   Maintain SCD's as Ordered   Increase mobility as tolerated/progressive mobility protocol   Perform active/passive ROM   Reposition patient every 2 hours and as needed unless able to reposition self   Assess for  changes in respiratory status, level of consciousness and/or development of fatigue   Consult with Physical Therapy and/or Occupational Therapy  Goal: Nutritional Intake is Adequate  Outcome: Progressing  Flowsheets (Taken 07/23/2022 0201 by Mar Daring, RN)  Nutritional Intake is Adequate:   Cardiac diet-2 gm Sodium   Fluid Restricction if needed   Consult/Collaborate with Nutritionist   Patient and family teaching on low sodium diet   Assess appetite,anorexia and amount of meal/food tolerated   Encourage/perform oral hygiene as appropriate

## 2022-07-27 NOTE — Progress Notes (Signed)
Pt is awake. R permanent catheter access is optimally working post dialysis.  Catheter dressing changed.  BP low during tx, albumin 25% given and UF lowered.  Hemodialysis ended and pt. able to finish 3.5hrs  tx.  Able to removed Net Fluid of 1.8L.  Reports were given to Primary nurse       07/27/22 1303   Treatment Summary   Time Off Machine 1300   Duration of Treatment (Hours) 3.5   Treatment Type 2:1   Dialyzer Clearance Moderately streaked   Fluid Volume Off (mL) 2300   Prime Volume (mL) 200   Rinseback Volume (mL) 200   Fluid Given: Normal Saline (mL) 0   Fluid Given: PRBC  0 mL   Fluid Given: Albumin (mL) 100   Fluid Given: Other (mL) 0   Total Fluid Given 500   Hemodialysis Net Fluid Removed 1800   Post Treatment Assessment   Post-Treatment Weight (Kg) -1.8   Patient Response to Treatment fairly tolerated   Additional Dialyzer Used 0\   Permacath Catheter - Tunneled 07/14/22 Subclavian vein catheter Right   Placement Date/Time: 07/14/22 1404   Inserted by: Dr. Welton Flakes  Access Type: Subclavian vein catheter  Orientation: Right  Central Line Infection Prevention Education provided?: Yes  Hand Hygiene: Alcohol based hand scrub;Soap and water  Line cart u...   Line necessity reviewed? Apheresis/hemodialysis   Site Assessment Clean;Dry;Intact   Catheter Lumen Volume Venous 1.6 mL   Catheter Lumen Volume Arterial 1.6 mL   Dressing Status and Intervention Dressing Intact;Clean & Dry   Tego/Curos Caps on Catheter Yes   Dressing Type Transparent;Biopatch   Dressing Status Clean;Dry;Intact   Dressing Change Due 08/02/22   Vitals   Temp 97.9 F (36.6 C)   Heart Rate 78   Resp Rate 18   BP 138/64   SpO2 100 %   O2 Device None (Room air)   Assessment   Mental Status Other (Comment)  (pt alert and awake)   Cardiac Regularity Return to [facility]   Cardiac Rhythm Normal sinus rhythm   Respiratory  WDL   Respiratory Pattern Return to WDL   Bilateral Breath Sounds Diminished   R Breath Sounds Diminished   L Breath Sounds  Diminished   RLE Edema Non Pitting Edema   LLE Edema Non Pitting Edema   General Skin Color Appropriate for ethnicity   Skin Condition/Temp Return to [facility]   Abdomen Inspection Rounded   Mobility Bed   Pain Assessment   Charting Type Reassessment   Pain Scale Used PAINAD (Advanced Dementia)   Education   Person taught Other (Comments)  (daughter)   Knowledge basis Substantial   Topics taught Procedure   Teaching Tools Explain   Reponse Verbalizes Understanding   Bedside Nurse Communication   Name of bedside RN - post dialysis Daiva Nakayama, RN

## 2022-07-27 NOTE — Progress Notes (Addendum)
LOS # [id number]    Summary of Discharge Plan:   SNF - [facility]      Identified Possible Discharge Barriers:   No SNF choice has been provided   Auth needed prior to placement   HD patient; family willing to set up transport     CM Interventions and Outcome:    Spoke with [name] (daughter) via phone regarding DCP. CM encouraged the family to make a SNF choice to begin insurance authorization for services.     - Referral updated in [employer]   - Updated list with family setting up transport to be sent to [email or web address]   - CM to follow up in the afternoon    [name] was able to speak with [facility] liaison about SNF options.     Discussed above Discharge Plan with (patient, family, Care Team, others):  Family, Care team    Case Management will continue to following on patient's discharge needs.    [place], MSW  Social Work Futures trader I  [facility]  [phone]

## 2022-07-27 NOTE — Progress Notes (Signed)
[facility]  PROGRESS NOTE      Patient: Shelby Barron  Date: 07/27/2022   LOS: 29 Days  Admission Date: 06/28/2022   MRN: [id number]  Attending: Melina Fiddler, MD  When on service as the attending, please contact me on Epic Secure Chat from 7 AM - 7 PM for non-urgent issues. For urgent matters use XTend page from 7 AM - 7 PM.       ASSESSMENT/PLAN     [name] is a 82 y.o. female admitted with Bladder cancer    Toxic metabolic Encephalopathy/delirium resolving  - Patient was upgraded to ICU on 6/30 for lethargy and decreased responsiveness  - CT head was repeated on 6/29, no acute finding  - ABG consistent with respiratory alkalosis  - EEG consistent with encephalopathy no seizure, MRI no acute stroke  - CT abdomen with and without contrast after premedication did not show any leak or abscess  - SLP advanced diet to pureed.   -I discussed with SLP today patient had more than 75% of her meal this morning and lunch.  -Remove NG feeding and monitor  - Palliative consulted:  Family is clear they would like no limits to care and patient should remain full code.     Anemia, 2/2 CKD and blood loss during OR  - TSAT 18%  - continue Aranesp per nephrology  - Trend CBC, transfuse Hgb<7. Status post 1U pRBC for Hgb 6.1 on 7/7 with improvement   -H&H has been stable around 8.5  No active signs of bleeding    s/p open cystectomy, bilateral nephrectomies, and resection of small bowel after intra-op enterotomy    Mixed shock - hemorrhagic vs septic: Resolved  Leukocytosis  - 6/10 small bowel enteretomy with 1L EBL. Went to the OR on June 12 for small bowel anastomosis, closure of mesenteric defect, closure of abdominal wall with wound VAC application. Wound VAC last changed on 7/8  - Surgery consulted, recommend CTAP with PO contrast that showed no anastomotic leak. Low concern for abscesses as they'd expect these to have a more loculated appearance in the interim between 6/17 and 7/1 imaging  - ID consulted,  completed meropenem and Mycamine for 2 weeks on 7/3.    -Discussed with ID and they recommend to monitor off antibiotic for now      Acute CVA on the right frontal lobe most likely from hypotension and shock  History of CVA  Petechial hemorrhage vs cortical laminar necrosis on CTH  - Left upper extremity not moving at all unclear duration  - Neurology following, initially started on ASA 81mg  only due to concern for risk of hemorrhagic conversion. Now OK to switch to Eliquis for Afib and discontinue aspirin per neurology on 7/5 as > 10d post event.  - SBP goal 120-160  - Carotid Doppler showed plaque with no critical stenosis  - Head CT on 7/7 w/ a few areas of cortical hyperdensity - petechial hemorrhage vs cortical laminar necrosis. Discussed with neuro OK to continue eliquis for now. Plan for repeat CTH today. Neuro exam stable.  - EEG encephalopathic pattern no seizure, repeat MRI no new infarction  - PT OT recommend SNF  -Repeat CT head on 07/26/2022 stable     Acute hypoxemic respiratory failure resolved  COPD  Extubated on 6/13, currently on room air  Maintain SaO2 > 92%  Continue as needed neb treatment  X-ray repeated on 6/29 showed resolution of interstitial edema  ESRD  S/p CRRT this admission, now on HD TTS  Continue dialysis per nephrology  IR removed the nonfunctional permacath and right [place] IJ cath and replaced with a new permacath on 6/26.     HTN  On metoprolol   PRN IV labetalol for SBP > 180      New onset Afib with RVR, improved  - CHA2DS2-VASc score is 7  - Status post amiodarone drip. Now stable on oral amiodarone and coreg per cardiology recommendations  - GI and general surgery cleared for anticoagulation. Neurology recommended to hold anticoagulation initiation for 10 days given the size of stroke.   -  started on Eliquis since 07/23/22        Renal cell carcinoma outpatient follow-up hematology oncology  Urothelial carcinoma  Thrombocytopenia  - VTE ppx: SCDs, eliquis     Diabetes  Mellitus type 2  -Sliding scale insulin                        Nutrition: Pured diet as tolerated  Malnutrition Documentation    Moderate Malnutrition related to inadequate nutritional intake in the setting of acute illness as evidenced by <50% EER > 5 days, mild muscle losses (temporalis, deltoid) - new                 Patient has BMI=Body mass index is 38.39 kg/m.  Diagnosis: Obesity based on BMI criteria         Recent Labs   Lab 07/27/22  0338 07/26/22  0558 07/25/22  1421   Hemoglobin 8.5* 8.7* 6.7*   Hematocrit 25.2* 26.6* 21.0*   MCV 90.0 90.2 92.9   WBC 11.67* 13.02* 12.91*   Platelet Count 436* 445* 405*       Recent Labs   Lab 07/18/22  0421 06/02/22  0514   Ferritin 2,300.21*  --    Iron 18* 62   TIBC 99* 145*   Iron Saturation 18 43     Anemia Diagnosis: Already documented in note    Code Status: Full Code    Dispo: SNF    Family Contact: Daughter [name] and [place]    DVT Prophylaxis:   Current Facility-Administered Medications (Includes Only Anticoagulants, Misc. Hematological)   Medication Dose Route Last Admin    apixaban (ELIQUIS) tablet 2.5 mg  2.5 mg Oral 2.5 mg at 07/27/22 0800          CHART  REVIEW & DISCUSSION     The following chart items were reviewed as of 5:09 PM on 07/27/22:  [x]  Lab Results [x]  Imaging Results   [x]  Problem List  [x]  Current Orders [x]  Current Medications  []  Allergies  [x]  Code Status [x]  Previous Notes   []  SDoH    The management and plan of care for this patient was discussed with the following specialty consultants:  []  Cardiology  []  Gastroenterology                 [x]  Infectious Disease  []  Pulmonology []  Neurology                []  Nephrology  []  Neurosurgery []  Orthopedic Surgery  []  Heme/Onc  []  General Surgery []  Psychiatry                                   []  Palliative    SUBJECTIVE     Shelby Libra  Barron report feeling well denies any headache dizziness chest pain shortness of breath she reports mild abdominal pain and she reports being hungry and she wants to  eat    MEDICATIONS     Current Facility-Administered Medications   Medication Dose Route Frequency    acetaminophen  650 mg Oral 4 times per day    Or    acetaminophen  650 mg Oral 4 times per day    amiodarone  200 mg per NG tube Daily    apixaban  2.5 mg Oral Q12H [facility]    carvedilol  12.5 mg Oral Q12H [facility]    darbepoetin alfa  0.45 mcg/kg Subcutaneous Weekly    glycopyrrolate  0.2 mg Intravenous Q8H    insulin glargine  7 Units Subcutaneous QAM    insulin lispro  1-8 Units Subcutaneous Q6H    pantoprazole  40 mg per NG tube Q12H [facility]    thiamine  100 mg per NG tube Daily       PHYSICAL EXAM     Vitals:    07/27/22 1400   BP: 146/62   Pulse: 80   Resp: 19   Temp: 98.6 F (37 C)   SpO2: 98%       Temperature: Temp  Min: 97.9 F (36.6 C)  Max: 98.6 F (37 C)  Pulse: Pulse  Min: 53  Max: 80  Respiratory: Resp  Min: 17  Max: 20  Non-Invasive BP: BP  Min: 104/52  Max: 174/70  Pulse Oximetry SpO2  Min: 96 %  Max: 100 %    Intake and Output Summary (Last 24 hours) at Date Time    Intake/Output Summary (Last 24 hours) at 07/27/2022 1709  Last data filed at 07/27/2022 1303  Gross per 24 hour   Intake 15 ml   Output 1800 ml   Net -1785 ml         GEN APPEARANCE: Alert, awake and oriented x 1  HEENT: PERLA; EOMI; Conjunctiva Clear  NECK: Supple; No bruits  CVS: RRR, S1, S2; No M/G/R  LUNGS: CTAB; , no Rhonchi: No rales  ABD: Soft; No TTP; + Normoactive BS  EXT: No edema; Pulses 2+ and intact  NEURO: moving right upper extremity spontaneously, able to move lower extremities for pain but minimal, no movement at all on the left upper extremity      LABS     Recent Labs   Lab 07/27/22  0338 07/26/22  0558 07/25/22  1421   WBC 11.67* 13.02* 12.91*   RBC 2.80* 2.95* 2.26*   Hemoglobin 8.5* 8.7* 6.7*   Hematocrit 25.2* 26.6* 21.0*   MCV 90.0 90.2 92.9   Platelet Count 436* 445* 405*       Recent Labs   Lab 07/27/22  0338 07/26/22  0558 07/25/22  0351 07/24/22  1129 07/23/22  0242   Sodium 138 138 137 140 139   Potassium 4.0 4.0 3.7 3.7  3.9   Chloride 99 99 100 102 103   CO2 23 23 23 26 24    BUN 61* 45* 29* 21 25*   Creatinine 4.7* 3.8* 2.5* 2.0* 2.7*   Glucose 182* 170* 163* 99 141*   Calcium 8.9 8.7 8.0 8.4 8.8   Magnesium 2.0 2.0 1.8 1.8 1.8                         Microbiology Results (last 15 days)       Procedure Component Value  Units Date/Time    Culture, Blood, Aerobic And Anaerobic [[id number]] Collected: 07/18/22 1400    Order Status: Completed Specimen: Blood, Venous Updated: 07/23/22 2200     Culture Blood No growth at 5 days    Culture, Blood, Aerobic And Anaerobic [[id number]] Collected: 07/18/22 1400    Order Status: Completed Specimen: Blood, Venous Updated: 07/23/22 2200     Culture Blood No growth at 5 days             RADIOLOGY     CT Head WO Contrast    Result Date: 07/26/2022   1.No significant change in multifocal areas of subacute infarction within bilateral cerebral hemispheres. 2.Small areas of petechial hemorrhage in the left parietal and occipital lobes are unchanged. No new hemorrhage or measurable parenchymal hematoma. 3.Persistent complete opacification of the right mastoid air cells and middle ear cavity. Garnette Gunner, MD 07/26/2022 12:27 PM    CT Head WO Contrast    Result Date: 07/25/2022   1.Redemonstration of multifocal areas of bilateral acute infarctions. A few areas of cortical hyperdensity likely represent petechial hemorrhage. No mass occupying hematoma. No significant mass effect or midline shift. 2.Redemonstrated right mastoid and right middle ear cavity effusion with areas of bony thinning versus dehiscence of the right tegmen tympani and right tegmen mastoideum. Vara Guardian, MD 07/25/2022 2:19 PM    CT Abdomen Pelvis W IV And PO Cont    Result Date: 07/19/2022   1.Slight interval increase in left-sided retroperitoneal fluid collection with the other abdominal fluid collections appearing relatively stable. 2. There is no definite abscess or acute inflammatory process. 3. Incidental note is made of a small  paraesophageal type hernia. 4. Sigmoid diverticulosis is noted without CT evidence of diverticulitis. 5. Other findings as noted above. Miguel Dibble, MD 07/19/2022 6:36 AM    MRI Brain WO Contrast    Result Date: 07/18/2022   Interval evolution of multifocal recent infarcts in the bilateral cerebral hemispheres. No new infarct. Milan Waunita Schooner, MD 07/18/2022 5:57 PM    XR Chest AP Portable    Result Date: 07/17/2022  1.Near-complete resolution of interstitial edema. 2.Improving pulmonary vascular congestion. Legrand Pitts, MD 07/17/2022 2:38 PM    CT Head WO Contrast    Result Date: 07/17/2022   1.No new acute intracranial pathology. 2.There are multiple regions of cytotoxic edema in the right > left frontal and left > right occipital lobes, consistent with known early subacute infarcts. Dannielle Burn 07/17/2022 8:26 AM    US Carotid Duplex Dopp Comp Bilateral    Result Date: 07/15/2022   1. Plaque formation in the right internal carotid artery bulb with elevated velocities. Stenosis is estimated at 50-69%. 2. Plaque in the left internal carotid artery with normal velocities. Any stenosis is less than 50%. Criteria for stenosis and velocity parameters are based on the guidelines of the SRU (Society of Radiologists in Ultrasound). Grant et al: Carotid Artery Stenosis: Grayscale and Doppler Ultrasound Diagnosis-- Society of Radiologists in Ultrasound Consensus Conference Ultrasound Quarterly. Volume 19, Number 4; December, 2003. Recommendations updated October 2021 Verne Carrow is an [facility] accredited facility Suszanne Finch, MD 07/15/2022 9:41 AM    Tunneled Cath Removal (Permcath)    Result Date: 07/14/2022  1. Nonfunctioning tunneled dialysis catheter as described above. 2. Successful placement of a new 19 cm Equistream catheter as described above. Dimitrios Papadouris, MD 07/14/2022 4:06 PM    Tunneled Cath Placement (Permcath)    Result Date: 07/14/2022  1. Nonfunctioning tunneled dialysis catheter as  described above. 2. Successful  placement of a new 19 cm Equistream catheter as described above. Dimitrios Papadouris, MD 07/14/2022 4:06 PM    MRI Brain WO Contrast    Result Date: 07/13/2022   1.Patchy areas of acute/recent infarction involving the cerebral hemispheres bilaterally. The infarcts have a configuration suggesting watershed territory infarcts. 2.Chronic ischemic changes as discussed. Georgann Housekeeper, MD 07/13/2022 10:28 PM    CT Head WO Contrast    Result Date: 07/13/2022   1.New region of loss of gray-white matter differentiation in the right frontal lobe, concerning for acute infarction. 2.A few additional areas of poor gray-white matter differentiation for example in the left occipital lobe may be artifactual but could be correlated with MRI. 3.No evidence of acute intracranial hemorrhage or significant mass effect. These urgent results were discussed with and acknowledged by Dr. Don Broach on 07/13/2022 3:20 PM. Vara Guardian, MD 07/13/2022 3:21 PM    XR Chest AP Portable    Result Date: 07/10/2022  Interstitial markings have slightly increased, suggesting minimal edema. No other change. Wilmon Pali, MD 07/10/2022 4:49 PM    XR Chest AP Portable    Result Date: 07/07/2022   There is mild interstitial prominence suggesting mild congestive heart failure/fluid overload in the setting of shortness of breath [name], MD 07/07/2022 12:32 PM    XR Chest AP Portable    Result Date: 07/06/2022   Hypoventilation with basilar atelectasis. Charlott Rakes, MD 07/06/2022 3:35 PM    CT Abdomen Pelvis W IV And PO Cont    Result Date: 07/06/2022  1.Postoperative changes from bilateral nephrectomy and cystectomy. 2.Small amount of ascites including some areas which appear loculated. Small collections of fluid and gas in the anterior midline abdomen may be postoperative but superimposed infection is not excluded. No rim-enhancing fluid collections or extravasation of oral contrast to suggest anastomotic leak. Demetrios Isaacs, MD 07/06/2022 5:51 AM    CT Head WO  Contrast    Result Date: 07/06/2022   1.No acute intracranial abnormality is seen. Leandro Reasoner, MD 07/06/2022 3:56 AM    XR Abdomen AP    Result Date: 07/05/2022  Enteric tube terminates in the distal stomach. Elease Etienne, DO 07/05/2022 12:24 PM    XR Chest AP Portable    Result Date: 06/30/2022  1. Lines and tubes in adequate position. No pneumothorax. 2. Trace left pleural effusion and bibasilar atelectasis. Jasmine December D'Heureux, MD 06/30/2022 4:53 PM    XR Chest AP Portable    Result Date: 06/30/2022  Catheters as described. Pankaj Dominica, MD 06/30/2022 2:07 PM    XR Chest AP Portable    Result Date: 06/28/2022   Right mainstem bronchial intubation. The tube should be withdrawn about 4 cm. Dr. Tildon Husky is aware. Wynema Birch, MD 06/28/2022 4:56 PM    XR OR MISCOUNT    Result Date: 06/28/2022   No unexpected foreign body. These critical results were discussed with and acknowledged by Angie R., the circulating nurse, who will communicate the findings to Everardo All, MD on 06/28/2022 3:02 PM. Bosie Helper, MD 06/28/2022 3:03 PM   Echo Results       None          Results for orders placed or performed during the hospital encounter of 06/28/22   MRI Brain WO Contrast    Narrative    HISTORY: Declining mental status, right gaze deviation.     COMPARISON: CT head 07/17/2022 and MRI brain 07/13/2022.     TECHNIQUE: MRI of the brain  performed on a 1.5 Tesla scanner without  intravenous contrast.    CONTRAST: None.    FINDINGS:     Interval evolution of multifocal recent infarcts with associated edema in  the bilateral cerebral hemispheres. No new infarct. Mild chronic small  vessel ischemic changes within the cerebral white matter. Mild generalized  cerebral volume loss.     No acute hemorrhage, extra-axial fluid collection, or midline shift. No  mass lesion or vasogenic edema.     Normal appearance of the sella. Cerebellar tonsils are appropriately  positioned. No acute hydrocephalus. Patent basal cisterns. Skull base flow  voids are  intact.    No osseous abnormality. No acute orbital abnormality. Status post bilateral  lens replacement. No significant paranasal sinus mucosal disease. Severe  opacification of the right mastoid air cells and middle ear cavity. Mild  opacification of left mastoid air cells.       Impression         Interval evolution of multifocal recent infarcts in the bilateral cerebral  hemispheres. No new infarct.    Milan Waunita Schooner, MD  07/18/2022 5:57 PM   CT Head WO Contrast    Narrative    HISTORY: Petechial hemorrhage follow up.     COMPARISON: Multiple priors most recent CT 07/25/2022    TECHNIQUE: CT of the head performed without intravenous contrast. The  following dose reduction techniques were utilized: automated exposure  control and/or adjustment of the mA and/or KV according to patient size,  and the use of an iterative reconstruction technique.    CONTRAST: None.    FINDINGS:  Multifocal areas of subacute infarction within bilateral cerebral  hemispheres remain similar to prior. Small areas of petechial hemorrhage in  the left parietal and occipital lobes are unchanged. No new hemorrhage or  measurable parenchymal hematoma.    There is a background moderate chronic microangiopathic changes in the  periventricular white matter. Moderate generalized volume loss. No  extra-axial fluid collection or midline shift. Ventricles and basal  cisterns are patent.    Persistent complete opacification of the right mastoid air cells and middle  ear cavity. Paranasal sinuses are clear.      Impression       1.No significant change in multifocal areas of subacute infarction within  bilateral cerebral hemispheres.  2.Small areas of petechial hemorrhage in the left parietal and occipital  lobes are unchanged. No new hemorrhage or measurable parenchymal hematoma.  3.Persistent complete opacification of the right mastoid air cells and  middle ear cavity.    Garnette Gunner, MD  07/26/2022 12:27 PM       Signed,  Melina Fiddler,  MD  5:09 PM 07/27/2022

## 2022-07-27 NOTE — Plan of Care (Signed)
L buttock & peri anal area was cleansed with wound cleanser, patted dry, Triad applied and was left open to air.    Patient was turned and repositioned for comfort.  Gown/Linens changed.  Patient tolerated the procedure well.

## 2022-07-27 NOTE — Nursing Progress Note (Signed)
Spoke with Dr Hosie Poisson for  neuro clearance for downgrading the patient, Dr, Hosie Poisson clearly stated w can d/c NIH but keep neuro checks.    Downgrade order in place, waiting for bed placement.  Dr.Al Najafi in the [facility] agreed when I asked her about downgrading the patient she said OK  to downgarde if cleared by Neuro.  No other complaint.

## 2022-07-27 NOTE — Plan of Care (Signed)
NURSING SHIFT NOTE     Patient: Shelby Barron  Day: 29      SHIFT EVENTS     Shift Narrative/Significant Events (PRN med administration, fall, RRT, etc.):     Patient transferred from ICU during shift. She is A&Ox1-2 to self and place. She is on room air. NSR/sinus brady on tele. PAINAD score between 0-2 during shift. Scheduled tylenol administered for pain. Nocturnal tube feeding administered as ordered. Wound care performed. Neuro checks performed. NIHSS score 18. Scheduled medications administered as ordered.     Safety and fall precautions remain in place. Purposeful rounding completed.          ASSESSMENT     Changes in assessment from patient's baseline this shift:    Neuro: No  CV: No  Pulm: No  Peripheral Vascular: No  HEENT: No  GI: No  BM during shift: Yes, Last BM: Last BM Date: 07/26/22  GU: No   Integ: No  MS: No    Pain: improved  Pain Interventions: Medications and Rest  Medications Utilized: scheduled tylenol    Mobility: PMP Activity: Step 3 - Bed Mobility of Distance Walked (ft) (Step 6,7): 0 Feet           Lines     Patient Lines/Drains/Airways Status       Active Lines, Drains and Airways       Name Placement date Placement time Site Days    Permacath Catheter - Tunneled 07/14/22 Subclavian vein catheter Right 07/14/22  1404  -- 12    Peripheral IV 07/25/22 20 G Right Antecubital 07/25/22  1700  Antecubital  1    Peripheral IV 07/25/22 22 G Anterior;Left Forearm 07/25/22  1700  Forearm  1    Midline IV 07/01/22 Anterior;Left Upper Arm 07/01/22  1315  Upper Arm  25    Feeding Tube ND 12 Fr. Right nare 07/05/22  1130  Right nare  21                         VITAL SIGNS     Vitals:    07/26/22 2345   BP: 148/57   Pulse: (!) 58   Resp: 18   Temp: 98.1 F (36.7 C)   SpO2: 97%       Temp  Min: 97.6 F (36.4 C)  Max: 98.3 F (36.8 C)  Pulse  Min: 53  Max: 76  Resp  Min: 14  Max: 30  BP  Min: 121/65  Max: 196/79  SpO2  Min: 97 %  Max: 100 %      Intake/Output Summary (Last 24 hours) at 07/27/2022  0136  Last data filed at 07/26/2022 2000  Gross per 24 hour   Intake 490 ml   Output 250 ml   Net 240 ml              CARE PLAN        4 eyes in 4 hours pressure injury assessment note:      Completed with:   Unit & Time admitted:              Bony Prominences: Check appropriate box; if wound is present enter wound assessment in LDA     Occiput:                 [x] WNL  []  Wound present  Face:                     [  x]WNL  []  Wound present  Ears:                      [x] WNL  []  Wound present  Spine:                    [x] WNL  []  Wound present  Shoulders:             [x] WNL  []  Wound present  Elbows:                  [x] WNL  []  Wound present  Sacrum/coccyx:     [] WNL  [x]  Wound present  Ischial Tuberosity:  [x] WNL  []  Wound present  Trochanter/Hip:      [x] WNL  []  Wound present  Knees:                   [x] WNL  []  Wound present  Ankles:                   [x] WNL  []  Wound present  Heels:                    [x] WNL  []  Wound present  Other pressure areas:  []  Wound location       Device related: []  Device name:         LDA completed if wound present: yes/no  Consult WOCN if necessary    Other skin related issues, ie tears, rash, etc, document in Integumentary flowsheet       Problem: Moderate/High Fall Risk Score >5  Goal: Patient will remain free of falls  Outcome: Progressing  Flowsheets (Taken 07/26/2022 2130)  High (Greater than 13):   HIGH-Visual cue at entrance to patient's room   HIGH-Bed alarm on at all times while patient in bed   HIGH-Utilize chair pad alarm for patient while in the chair   HIGH-Apply yellow "Fall Risk" arm band   HIGH-Pharmacy to initiate evaluation and intervention per protocol   HIGH-Initiate use of floor mats as appropriate   HIGH-Consider use of low bed     Problem: Compromised Sensory Perception  Goal: Sensory Perception Interventions  Outcome: Progressing  Flowsheets (Taken 07/26/2022 2130)  Sensory Perception Interventions: Offload heels, Pad bony prominences, Reposition q 2hrs/turn Clock, Q2  hour skin assessment under devices if present     Problem: Compromised Moisture  Goal: Moisture level Interventions  Outcome: Progressing  Flowsheets (Taken 07/26/2022 2130)  Moisture level Interventions: Moisture wicking products, Moisture barrier cream     Problem: Compromised Activity/Mobility  Goal: Activity/Mobility Interventions  Outcome: Progressing  Flowsheets (Taken 07/26/2022 2130)  Activity/Mobility Interventions: Pad bony prominences, TAP Seated positioning system when OOB, Promote PMP, Reposition q 2 hrs / turn clock, Offload heels     Problem: Compromised Nutrition  Goal: Nutrition Interventions  Outcome: Progressing  Flowsheets (Taken 07/26/2022 2130)  Nutrition Interventions: Discuss nutrition at Rounds, I&Os, Document % meal eaten, Daily weights     Problem: Compromised Friction/Shear  Goal: Friction and Shear Interventions  Outcome: Progressing  Flowsheets (Taken 07/26/2022 2130)  Friction and Shear Interventions: Pad bony prominences, Off load heels, HOB 30 degrees or less unless contraindicated, Consider: TAP seated positioning, Heel foams     Problem: Impaired Mobility  Goal: Mobility/Activity is maintained at optimal level for patient  Outcome: Progressing  Flowsheets (Taken 07/24/2022 1549 by Peagler, Alene, RN)  Mobility/activity is maintained at optimal level for patient:   Encourage independent activity per  ability   Consult/collaborate with Physical Therapy and/or Occupational Therapy     Problem: Constipation  Goal: Fluid and electrolyte balance are achieved/maintained  Outcome: Progressing  Flowsheets (Taken 07/27/2022 0130)  Fluid and electrolyte balance are achieved/maintained:   Monitor/assess lab values and report abnormal values   Assess and reassess fluid and electrolyte status   Observe for cardiac arrhythmias   Monitor for muscle weakness     Problem: Safety  Goal: Patient will be free from injury during hospitalization  Outcome: Progressing  Flowsheets (Taken 07/27/2022 0130)  Patient will be  free from injury during hospitalization:   Assess patient's risk for falls and implement fall prevention plan of care per policy   Provide and maintain safe environment   Use appropriate transfer methods   Ensure appropriate safety devices are available at the bedside   Include patient/ family/ care giver in decisions related to safety   Hourly rounding  Goal: Patient will be free from infection during hospitalization  Outcome: Progressing  Flowsheets (Taken 07/27/2022 0130)  Free from Infection during hospitalization:   Assess and monitor for signs and symptoms of infection   Monitor lab/diagnostic results   Monitor all insertion sites (i.e. indwelling lines, tubes, urinary catheters, and drains)   Encourage patient and family to use good hand hygiene technique     Problem: Pain  Goal: Pain at adequate level as identified by patient  Outcome: Progressing  Flowsheets (Taken 07/27/2022 0130)  Pain at adequate level as identified by patient:   Identify patient comfort function goal   Assess for risk of opioid induced respiratory depression, including snoring/sleep apnea. Alert healthcare team of risk factors identified.   Assess pain on admission, during daily assessment and/or before any "as needed" intervention(s)   Reassess pain within 30-60 minutes of any procedure/intervention, per Pain Assessment, Intervention, Reassessment (AIR) Cycle   Evaluate if patient comfort function goal is met   Evaluate patient's satisfaction with pain management progress   Offer non-pharmacological pain management interventions   Include patient/patient care companion in decisions related to pain management as needed     Problem: Side Effects from Pain Analgesia  Goal: Patient will experience minimal side effects of analgesic therapy  Outcome: Progressing  Flowsheets (Taken 07/27/2022 0130)  Patient will experience minimal side effects of analgesic therapy:   Monitor/assess patient's respiratory status (RR depth, effort, breath sounds)    Assess for changes in cognitive function   Prevent/manage side effects per LIP orders (i.e. nausea, vomiting, pruritus, constipation, urinary retention, etc.)   Evaluate for opioid-induced sedation with appropriate assessment tool (i.e. POSS)     Problem: Hemodynamic Status: Cardiac  Goal: Stable vital signs and fluid balance  Outcome: Progressing  Flowsheets (Taken 07/27/2022 0130)  Stable vital signs and fluid balance:   Assess signs and symptoms associated with cardiac rhythm changes   Monitor lab values     Problem: Renal Instability  Goal: Fluid and electrolyte balance are achieved/maintained  Outcome: Progressing  Flowsheets (Taken 07/27/2022 0130)  Fluid and electrolyte balance are achieved/maintained:   Monitor/assess lab values and report abnormal values   Assess and reassess fluid and electrolyte status   Observe for cardiac arrhythmias   Monitor for muscle weakness     Problem: Patient Receiving Advanced Renal Therapies  Goal: Therapy access site remains intact  Outcome: Progressing  Flowsheets (Taken 07/27/2022 0130)  Therapy access site remains intact: Assess therapy access site     Problem: Fluid and Electrolyte Imbalance/ Endocrine  Goal: Fluid and  electrolyte balance are achieved/maintained  Outcome: Progressing  Flowsheets (Taken 07/27/2022 0130)  Fluid and electrolyte balance are achieved/maintained:   Monitor/assess lab values and report abnormal values   Assess and reassess fluid and electrolyte status   Observe for cardiac arrhythmias   Monitor for muscle weakness  Goal: Adequate hydration  Outcome: Progressing  Flowsheets (Taken 07/27/2022 0130)  Adequate hydration:   Assess mucus membranes, skin color, turgor, perfusion and presence of edema   Assess for peripheral, sacral, periorbital and abdominal edema   Monitor and assess vital signs and perfusion     Problem: Inadequate Gas Exchange  Goal: Adequate oxygenation and improved ventilation  Outcome: Progressing  Flowsheets (Taken 07/20/2022 1132 by Orbie Pyo, RN)  Adequate oxygenation and improved ventilation:   Assess lung sounds   Monitor SpO2 and treat as needed     Problem: Neurological Deficit  Goal: Neurological status is stable or improving  Outcome: Progressing  Flowsheets (Taken 07/27/2022 0130)  Neurological status is stable or improving:   Monitor/assess/document neurological assessment (Stroke: every 4 hours)   Monitor/assess NIH Stroke Scale   Re-assess NIH Stroke Scale for any change in status   Perform CAM Assessment     Problem: Potential for Aspiration  Goal: Risk of aspiration will be minimized  Outcome: Progressing  Flowsheets (Taken 07/27/2022 0130)  Risk of aspiration will be minimized:   Assess/monitor ability to swallow using dysphagia screen: Keep patient NPO if patient fails screening   Monitor/assess for signs of aspiration (tachypnea, cough, wheezing, clearing throat, hoarseness after eating, decrease in SaO2)     Problem: Every Day - Stroke  Goal: Mobility/Activity is maintained at optimal level for patient  Outcome: Progressing  Flowsheets (Taken 07/24/2022 1549 by Peagler, Fannie Knee, RN)  Mobility/activity is maintained at optimal level for patient:   Encourage independent activity per ability   Consult/collaborate with Physical Therapy and/or Occupational Therapy  Goal: Neurological status is stable or improving  Outcome: Progressing  Flowsheets (Taken 07/27/2022 0130)  Neurological status is stable or improving:   Monitor/assess/document neurological assessment (Stroke: every 4 hours)   Monitor/assess NIH Stroke Scale   Re-assess NIH Stroke Scale for any change in status   Perform CAM Assessment  Goal: Core/Quality measure requirements - Daily  Outcome: Progressing  Flowsheets (Taken 07/27/2022 0130)  Core/Quality measure requirements - Daily:   VTE Prevention: Ensure anticoagulant(s) administered and/or anti-embolism stockings/devices documented by end of day 2   Ensure antithrombotic administered or contraindication documented by LIP by end of  day 2   Once lipid panel has resulted, check LDL. Contact provider for statin order if LDL > 70 (or ensure contraindication documented by LIP).   Continue stroke education (must include Modifiable Risk Factors, Warning Signs and Symptoms of Stroke, Activation of Emergency Medical System and Follow-up Appointments). Ensure handout has been given and documented.  Goal: Stable vital signs and fluid balance  Outcome: Progressing  Flowsheets (Taken 07/27/2022 0130)  Stable vital signs and fluid balance:   Position patient for maximum circulation/cardiac output   Monitor and assess vitals every 4 hours or as ordered and hemodynamic parameters   Monitor intake and output. Notify LIP if urine output is < 30 mL/hour.   Apply telemetry monitor as ordered  Goal: Patient's risk of aspiration will be minimized  Outcome: Progressing  Flowsheets (Taken 07/27/2022 0130)  Patient's risk of aspiration will be minimized:   Complete new dysphagia screen for any change in status: Keep patient NPO if patient fails   Monitor/assess  for signs of aspiration (tachypnea, cough, wheezing, clearing throat, hoarseness after eating, decrease in SaO2   Assess and monitor ability to swallow   Keep head of bed up a minimum of 30 degrees when hemodynamically stable   Thicken liquids as recommended by Speech Pathologist   Order modified texture diet as recommend by Speech Pathologist   HOB up 90 degrees to eat if unable to be OOB   Consult/Collaborate with Speech Pathologist for dysphagia   Supervise patient during oral intake  Goal: Nutritional intake is adequate  Outcome: Progressing  Flowsheets (Taken 07/21/2022 1947 by Natale Lay, RN)  Nutritional intake is adequate:   Consult/collaborate with Clinical Nutritionist   Consult/collaborate with Speech Therapy (swallow evaluations)  Goal: Skin integrity is maintained or improved  Outcome: Progressing  Flowsheets (Taken 07/27/2022 0130)  Skin integrity is maintained or improved:   Assess Braden Scale  every shift   Turn or reposition patient every 2 hours or as needed unless able to reposition self   Increase activity as tolerated/progressive mobility   Relieve pressure to bony prominences   Avoid shearing   Keep skin clean and dry   Encourage use of lotion/moisturizer on skin   Monitor patient's hygiene practices   Collaborate with Wound, Ostomy, and Continence Nurse   Keep head of bed 30 degrees or less (unless contraindicated)  Goal: Neurovascular status is stable or improving  Outcome: Progressing  Flowsheets (Taken 07/27/2022 0130)  Neurovascular status is stable or improving:   Monitor/assess neurovascular status (pulses, capillary refill, pain, paresthesia, presence of edema)   Monitor/assess for signs of Venous Thrombus Emboli (edema of calf/thigh redness, pain)   Monitor/assess site of invasive procedure for signs of bleeding  Goal: Effective coping demonstrated  Outcome: Progressing  Flowsheets (Taken 07/21/2022 1947 by Natale Lay, RN)  Effective coping demonstrated:   Assess/report to LIP uncontrolled anxiety, depression, or ineffective coping   Offer reassurance to decrease anxiety  Goal: Will be able to express needs and understand communication  Outcome: Progressing  Flowsheets (Taken 07/21/2022 1947 by Natale Lay, RN)  Able to express needs and understand communication:   Provide alternative method of communication if needed   Include patient care companion in decisions related to communication   Patient/patient care companion demonstrates understanding on disease process, treatment plan, medications and discharge plan   Consult/collaborate with Speech Language Pathology (SLP)   Consult/collaborate with Case Management/Social Work     Problem: Pain interferes with ability to perform ADL  Goal: Pain at adequate level as identified by patient  Outcome: Progressing  Flowsheets (Taken 07/27/2022 0130)  Pain at adequate level as identified by patient:   Identify patient comfort function goal   Assess  for risk of opioid induced respiratory depression, including snoring/sleep apnea. Alert healthcare team of risk factors identified.   Assess pain on admission, during daily assessment and/or before any "as needed" intervention(s)   Reassess pain within 30-60 minutes of any procedure/intervention, per Pain Assessment, Intervention, Reassessment (AIR) Cycle   Evaluate if patient comfort function goal is met   Evaluate patient's satisfaction with pain management progress   Offer non-pharmacological pain management interventions   Include patient/patient care companion in decisions related to pain management as needed     Problem: Everyday - Heart Failure  Goal: Mobility/Activity is Maintained at Optimal Level for Patient  Outcome: Progressing  Flowsheets (Taken 07/27/2022 0130)  Mobility/Activity is Maintained at Optimal Level for Patient:   Maintain SCD's as Ordered   Increase mobility as tolerated/progressive  mobility protocol   Perform active/passive ROM   Reposition patient every 2 hours and as needed unless able to reposition self   Assess for changes in respiratory status, level of consciousness and/or development of fatigue   Consult with Physical Therapy and/or Occupational Therapy  Goal: Nutritional Intake is Adequate  Outcome: Progressing  Flowsheets (Taken 07/23/2022 0201 by Mar Daring, RN)  Nutritional Intake is Adequate:   Cardiac diet-2 gm Sodium   Fluid Restricction if needed   Consult/Collaborate with Nutritionist   Patient and family teaching on low sodium diet   Assess appetite,anorexia and amount of meal/food tolerated   Encourage/perform oral hygiene as appropriate

## 2022-07-27 NOTE — Progress Notes (Signed)
Received pt from dialysis. Pt alert and oriented  to person. Skin warm and dry. Respirations eupnea. No s/sx of sob. Ngt intact. Would vac on abd intact.incontinent of bowel noted large watery bm. Four eyes skin assessment rendered and noted stage II on left upper thigh and bilat buttocks.  Tx applied. Trace edema in lower extremities. No c/o pain or discomfort. Plan. Will continue to monitor.

## 2022-07-28 LAB — CBC
Absolute nRBC: 0 10*3/uL (ref <=0.00)
Absolute nRBC: 0 10*3/uL (ref <=0.00)
Hematocrit: 22.1 % — ABNORMAL LOW (ref 34.7–43.7)
Hematocrit: 23.2 % — ABNORMAL LOW (ref 34.7–43.7)
Hemoglobin: 7.2 g/dL — ABNORMAL LOW (ref 11.4–14.8)
Hemoglobin: 7.7 g/dL — ABNORMAL LOW (ref 11.4–14.8)
MCH: 29.9 pg (ref 25.1–33.5)
MCH: 30.1 pg (ref 25.1–33.5)
MCHC: 32.6 g/dL (ref 31.5–35.8)
MCHC: 33.2 g/dL (ref 31.5–35.8)
MCV: 91.7 fL (ref 78.0–96.0)
MCV: 90.6 fL (ref 78.0–96.0)
MPV: 9.6 fL (ref 8.9–12.5)
MPV: 9.8 fL (ref 8.9–12.5)
Platelet Count: 338 10*3/uL (ref 142–346)
Platelet Count: 386 10*3/uL — ABNORMAL HIGH (ref 142–346)
RBC: 2.41 10*6/uL — ABNORMAL LOW (ref 3.90–5.10)
RBC: 2.56 10*6/uL — ABNORMAL LOW (ref 3.90–5.10)
RDW: 16 % — ABNORMAL HIGH (ref 11–15)
RDW: 16 % — ABNORMAL HIGH (ref 11–15)
WBC: 10.31 10*3/uL — ABNORMAL HIGH (ref 3.10–9.50)
WBC: 10.43 10*3/uL — ABNORMAL HIGH (ref 3.10–9.50)
nRBC %: 0 /100 WBC (ref <=0.0)
nRBC %: 0 /100 WBC (ref <=0.0)

## 2022-07-28 LAB — BASIC METABOLIC PANEL
Anion Gap: 15 (ref 5.0–15.0)
BUN: 41 mg/dL — ABNORMAL HIGH (ref 7–21)
CO2: 25 mEq/L (ref 17–29)
Calcium: 8.7 mg/dL (ref 7.9–10.2)
Chloride: 103 mEq/L (ref 99–111)
Creatinine: 3.9 mg/dL — ABNORMAL HIGH (ref 0.4–1.0)
GFR: 10.8 mL/min/{1.73_m2} — ABNORMAL LOW (ref >=60.0)
Glucose: 81 mg/dL (ref 70–100)
Potassium: 3.9 mEq/L (ref 3.5–5.3)
Sodium: 143 mEq/L (ref 135–145)

## 2022-07-28 LAB — WHOLE BLOOD GLUCOSE POCT
Whole Blood Glucose POCT: 91 mg/dL (ref 70–100)
Whole Blood Glucose POCT: 88 mg/dL (ref 70–100)
Whole Blood Glucose POCT: 160 mg/dL — ABNORMAL HIGH (ref 70–100)
Whole Blood Glucose POCT: 163 mg/dL — ABNORMAL HIGH (ref 70–100)
Whole Blood Glucose POCT: 109 mg/dL — ABNORMAL HIGH (ref 70–100)

## 2022-07-28 LAB — APTT: PTT: 31 s (ref 27–39)

## 2022-07-28 LAB — MAGNESIUM: Magnesium: 1.9 mg/dL (ref 1.6–2.6)

## 2022-07-28 LAB — ANTI-XA,UFH: Anti-Xa, UFH: 0.62 IU/mL

## 2022-07-28 LAB — PHOSPHORUS: Phosphorus: 2.7 mg/dL (ref 2.3–4.7)

## 2022-07-28 MED ORDER — HEPARIN (PORCINE) IN D5W 50-5 UNIT/ML-% IV SOLN (UNITS/KG/HR ONLY)
11.0000 [IU]/kg/h | INTRAVENOUS | Status: AC
Start: 2022-07-28 — End: 2022-08-01
  Administered 2022-07-28: 11 [IU]/kg/h via INTRAVENOUS
  Administered 2022-07-29: 9 [IU]/kg/h via INTRAVENOUS
  Filled 2022-07-28 (×2): qty 500

## 2022-07-28 MED ORDER — HEPARIN (PORCINE) IN D5W 50-5 UNIT/ML-% IV SOLN (UNITS/KG/HR ONLY)
11.0000 [IU]/kg/h | INTRAVENOUS | Status: DC
Start: 2022-07-28 — End: 2022-07-28

## 2022-07-28 NOTE — UM Notes (Signed)
** This review is compiled from documentation provided by the treatment team within the patient's medical record. **     Janace Litten, RN, BSN  Clinical Case Manager - Utilization Review  [facility]  [address]  [place], [place] [address]  UR Main no: [phone]  Fax: [phone]  [email or web address]  System Tax ID [id number]  [facility]            NPI [id number]  [place]      NPI [id number]  Einar Gip        NPI [id number]  [place]         NPI [id number]  [place]     NPI [id number]        Please use fax number to provide Authorization or to request additional information.      DATE OF REVIEW: 7/9    Patient Name: Shelby Barron       DOB: [birth year]  MRN: [id number]    PATIENT CLASS:  Patient placed to Inpatient on 06/28/2022    -Patient remains on general care unit  -Continuous telemetry monitoring  -Patient A&O to self, delayed responses, drowsy, forgetful, poor safety awareness  -Neuro checks every 4 hours  -Vital signs  -Wound care  -PT/OT    ASSESSMENT/PLAN      Shelby Barron is a 82 y.o. female admitted with Bladder cancer     Toxic metabolic Encephalopathy/delirium resolving  - Patient was upgraded to ICU on 6/30 for lethargy and decreased responsiveness  - CT head was repeated on 6/29, no acute finding  - ABG consistent with respiratory alkalosis  - EEG consistent with encephalopathy no seizure, MRI no acute stroke  - CT abdomen with and without contrast after premedication did not show any leak or abscess  - SLP advanced diet to pureed.   -I discussed with SLP today patient had more than 75% of her meal this morning and lunch.  -Remove NG feeding and monitor  - Palliative consulted:  Family is clear they would like no limits to care and patient should remain full code.     Anemia, 2/2 CKD and blood loss during OR  - TSAT 18%  - continue Aranesp per nephrology  - Trend CBC, transfuse Hgb<7. Status post 1U pRBC for Hgb 6.1 on 7/7 with improvement   -H&H has  been stable around 8.5  No active signs of bleeding     s/p open cystectomy, bilateral nephrectomies, and resection of small bowel after intra-op enterotomy    Mixed shock - hemorrhagic vs septic: Resolved  Leukocytosis  - 6/10 small bowel enteretomy with 1L EBL. Went to the OR on June 12 for small bowel anastomosis, closure of mesenteric defect, closure of abdominal wall with wound VAC application. Wound VAC last changed on 7/8  - Surgery consulted, recommend CTAP with PO contrast that showed no anastomotic leak. Low concern for abscesses as they'd expect these to have a more loculated appearance in the interim between 6/17 and 7/1 imaging  - ID consulted, completed meropenem and Mycamine for 2 weeks on 7/3.    -Discussed with ID and they recommend to monitor off antibiotic for now        Acute CVA on the right frontal lobe most likely from hypotension and shock  History of CVA  Petechial hemorrhage vs cortical laminar necrosis on CTH  - Left upper extremity not moving at all unclear duration  - Neurology following, initially started on ASA  81mg  only due to concern for risk of hemorrhagic conversion. Now OK to switch to Eliquis for Afib and discontinue aspirin per neurology on 7/5 as > 10d post event.  - SBP goal 120-160  - Carotid Doppler showed plaque with no critical stenosis  - Head CT on 7/7 w/ a few areas of cortical hyperdensity - petechial hemorrhage vs cortical laminar necrosis. Discussed with neuro OK to continue eliquis for now. Plan for repeat CTH today. Neuro exam stable.  - EEG encephalopathic pattern no seizure, repeat MRI no new infarction  - PT OT recommend SNF  -Repeat CT head on 07/26/2022 stable     Acute hypoxemic respiratory failure resolved  COPD  Extubated on 6/13, currently on room air  Maintain SaO2 > 92%  Continue as needed neb treatment  X-ray repeated on 6/29 showed resolution of interstitial edema     ESRD  S/p CRRT this admission, now on HD TTS  Continue dialysis per nephrology  IR  removed the nonfunctional permacath and right [place] IJ cath and replaced with a new permacath on 6/26.     HTN  On metoprolol   PRN IV labetalol for SBP > 180      New onset Afib with RVR, improved  - CHA2DS2-VASc score is 7  - Status post amiodarone drip. Now stable on oral amiodarone and coreg per cardiology recommendations  - GI and general surgery cleared for anticoagulation. Neurology recommended to hold anticoagulation initiation for 10 days given the size of stroke.   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 s/p bilateral radical nephrectomies and  cystectomy on 06/28/22  - complicated by small bowel enterotomy and blood loss   New onset afib - on amiodarone and eliquis  High grade urothelial bladder CA: s/p bilateral nephrectomies and cystectomy c/b mall bowel enterotomy and blood loss (~1L) on 06/28/22  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 - remains low, adequate iron - on aranesp  Encephalopathy - same  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   Hypophosphatemia -  better  HTN - BP high      Recommendations:   S/p Hd today with 1.8L net UF given soft BP  Monitor BP   Next HD on Thursday    Per ID:  Assessment:      Encephalopathy  Acute CVA  CT scan of the head-no significant change in multifocal areas of subacute infarction, unchanged small area of petechial hemorrhage in left parietal and occipital lobes (07/26/2022)  History of bladder cancer and renal failure  Status post radical  cystectomy/bilateral nephrectomy, enterectomy and lysis of adhesion (06/28/2022)  S/p small bowel resection  CT scan of abdomen and pelvis-no abscess or acute inflammatory process (07/19/2022)  Leukocytosis improving  End-stage renal disease on hemodialysis  Atrial fibrillation  Risk of aspiration     Plan:  Continue monitoring without antibiotics  Continue local wound care  Follow-up chest x-ray  Continue probiotics  Monitor electrolytes and renal functions closely  Monitor clinically    HD note:  Pt is awake. R permanent catheter access is optimally working post dialysis.  Catheter dressing changed.  BP low during tx, albumin 25% given and UF lowered.  Hemodialysis ended and pt. able to finish 3.5hrs  tx.  Able to removed Net Fluid of 1.8L.     Current Facility-Administered Medications   Medication Dose Route Frequency    acetaminophen  650 mg Oral 4 times per day     Or    acetaminophen  650 mg Oral 4 times per day    amiodarone  200 mg per NG tube Daily    apixaban  2.5 mg Oral Q12H SCH    carvedilol  12.5 mg Oral Q12H SCH    darbepoetin alfa  0.45 mcg/kg Subcutaneous Weekly    glycopyrrolate  0.2 mg Intravenous Q8H    insulin glargine  7 Units Subcutaneous QAM    insulin lispro  1-8 Units Subcutaneous Q6H    pantoprazole  40 mg per NG tube Q12H [facility]    thiamine  100 mg per NG tube Daily     07/27/22 1215 -- -- 64 -- 105/52 100 % -- --   07/27/22 1208 -- -- 74 18 105/54 100 % -- --   07/27/22 1200 -- -- 63 -- 110/54 100 % -- --   07/27/22 1145 -- -- 71 18 104/52 100 % -- --   07/27/22 1130 -- -- 67 -- 117/58 100 % -- --   07/27/22 1115 -- -- 61 18 134/61 100 %        Latest Reference Range & Units 07/27/22 03:38   WBC 3.10 - 9.50 x10 3/uL 11.67 (H)   Hemoglobin 11.4 - 14.8 g/dL 8.5 (L)   Hematocrit 16.1 - 43.7 % 25.2 (L)   Platelet Count 142 - 346 x10 3/uL 436 (H)   (H): Data is abnormally high  (L): Data is abnormally low   Latest Reference Range & Units 07/27/22 03:38   BUN 7 - 21 mg/dL 61 (H)   Creatinine  0.4 - 1.0 mg/dL 4.7 (H)   (H): Data is abnormally high   Latest Reference Range & Units 07/27/22 03:38   Anion Gap 5.0 - 15.0  16.0 (H)   EGFR >=60.0 mL/min/1.73 m2 8.6 (L)   (H): Data is abnormally high  (L): Data is abnormally low

## 2022-07-28 NOTE — Plan of Care (Signed)
Surgery Plan of Care Note:  Re-engaged out of concern for unhealthy tissue in patient's midline wound as noticed by Wound care team. Will plan for bedside debridement in two days to allow for Eliquis wash-out. Will defer to Primary Team regarding transitioning to heparin gtt to reduce time off anticoagulation.    Shelby Barron. Deno Etienne, MD  PGY-4 Surgery

## 2022-07-28 NOTE — Progress Notes (Signed)
07/28/22 1350   Medicare Checklist   Is this a Medicare patient? Yes   Patient received 1st IMM Letter? Yes   If LOS 3 days or greater, did patient received 2nd IMM Letter? Yes   Date of 2nd IMM Letter 07/28/22   Time of 2nd IMM letter 1110     Brock Bad, CMS

## 2022-07-28 NOTE — Progress Notes (Signed)
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 s/p bilateral radical nephrectomies and  cystectomy on 06/28/22  - complicated by small bowel enterotomy and blood loss   New onset afib - on amiodarone and eliquis  High grade urothelial bladder CA: s/p bilateral nephrectomies and cystectomy c/b mall bowel enterotomy and blood loss (~1L) on 06/28/22  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 - remains low, adequate iron - on aranesp  Encephalopathy - same  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   Hypophosphatemia -  better  HTN - BP high     Recommendations:   HD tomorrow  Encourage PO intake     Case discussed with: patient    Delice Bison, MD  [facility]  [phone] (office)  [phone] (FFX Spectra-Link)      Subjective: awake, in no distress  NGT removed       Review of Systems:   Resting     Physical Exam:     Vitals:    07/27/22 2032 07/27/22 2352 07/28/22 0525 07/28/22 0801   BP: 109/52 152/60 147/68 149/56   Pulse: 90 78 80 80   Resp:  16  18   Temp:  98.2 F (36.8 C) 99 F (37.2 C) 99 F (37.2 C)   TempSrc:  Oral Oral Oral   SpO2:  95% 98% 93%   Weight:       Height:           Intake and Output Summary (Last 24 hours) at Date Time    Intake/Output Summary (Last 24 hours) at 07/28/2022 1478  Last data filed at 07/27/2022 1744  Gross per 24 hour   Intake 350 ml   Output 1800 ml   Net -1450 ml         General: awake, in no distress  Cardiovascular: regular rate and rhythm  Lungs: bilateral air entry  Abdomen:  soft  Extremities: no edema  Access: R permacath     Meds:      Scheduled Meds: PRN Meds:    acetaminophen, 650 mg, Oral, 4 times per day   Or  acetaminophen, 650 mg, Oral, 4 times per day  amiodarone, 200 mg, per NG tube, Daily  apixaban, 2.5 mg, Oral, Q12H SCH  carvedilol, 12.5 mg, Oral, Q12H SCH  darbepoetin alfa, 0.45 mcg/kg, Subcutaneous, Weekly  glycopyrrolate, 0.2 mg, Intravenous, Q8H  insulin glargine, 7 Units, Subcutaneous, QAM  insulin lispro, 1-8 Units, Subcutaneous, Q6H  pantoprazole, 40 mg, per NG tube, Q12H SCH  thiamine, 100 mg, per NG tube, Daily          Continuous Infusions:       sodium chloride, , PRN  acetaminophen, 650 mg, Q6H PRN  albuterol-ipratropium, 3 mL, Q6H PRN  benzocaine, 1 spray, TID PRN  dextrose, 15 g of glucose, PRN   Or  dextrose, 12.5 g, PRN   Or  dextrose, 12.5 g, PRN   Or  glucagon (rDNA), 1 mg, PRN  diphenhydrAMINE, 25 mg, Q6H PRN  hydrALAZINE, 10 mg, Q3H PRN  labetalol, 10 mg, Q15 Min PRN  lidocaine 2% jelly, , Once PRN  naloxone, 0.2 mg, PRN  ondansetron, 4 mg, Q6H PRN  oxyCODONE, 2.5 mg, Q4H PRN  phenol, 1 spray, Q2H PRN              Labs:     Recent Labs   Lab 07/28/22  0537 07/27/22  0338 07/26/22  0558   WBC 10.31* 11.67* 13.02*   Hemoglobin 7.2* 8.5* 8.7*   Hematocrit 22.1* 25.2* 26.6*   Platelet Count 338 436* 445*     Recent Labs   Lab 07/28/22  0537 07/27/22  0338 07/26/22  0558   Sodium 143 138 138   Potassium 3.9 4.0 4.0   Chloride 103 99 99   CO2 25 23 23    BUN 41* 61* 45*   Creatinine 3.9* 4.7* 3.8*   Calcium 8.7 8.9 8.7   Phosphorus 2.7 3.2 2.9   Magnesium 1.9 2.0 2.0   Glucose 81 182* 170*   GFR 10.8* 8.6* 11.1*           Signed by: Delice Bison, MD, FASN

## 2022-07-28 NOTE — Progress Notes (Signed)
[facility] Diseases [facility]  Progress Note    07/28/2022   Shelby Barron ZOX:[id number]:[id number] is a 82 y.o. female,       Assessment:     Encephalopathy  Acute CVA  History of bladder cancer and renal failure  Status post radical cystectomy/bilateral nephrectomy, enterectomy and lysis of adhesion (06/28/2022)  S/p small bowel resection  No fever or leukocytosis  End-stage renal disease on hemodialysis  Atrial fibrillation  Risk of aspiration    Plan:     Continue monitoring without antibiotics  Continue local wound care  Follow-up chest x-ray  Continue probiotics  Monitor electrolytes and renal functions closely  Monitor clinically  Discussed with patient's daughter in detail    ROS:     General: No fever, sleepy, daughter at bedside  HEENT: NG tube removed  Respiratory: No cough or shortness of breath  Gastrointestinal: Abdominal wall wound/wound VAC in place  Genito-Urinary: no hematuria  Musculoskeletal: no edema  Neurological: Sleepy  Dermatological: No rash    Physical Examination:     Blood pressure 149/56, pulse 80, temperature 99 F (37.2 C), temperature source Oral, resp. rate 18, height 1.575 m (5\' 2" ), weight 95.2 kg (209 lb 14.1 oz), SpO2 93%.    General Appearance: Comfortable  HEENT: Pupils are equal, round, and reactive to light.   Lungs: Clear to auscultation  Heart: Regular rate and rhythm  Chest: Symmetric chest wall expansion.   Abdomen: soft, no hepatosplenomegaly, no bowel sounds, abdominal wall wound   Neurological: Sleepy  Extremities: No edema    Laboratory And Diagnostic Studies:     Recent Labs     07/28/22  0537 07/27/22  0338   WBC 10.31* 11.67*   Hemoglobin 7.2* 8.5*   Hematocrit 22.1* 25.2*   Platelet Count 338 436*     Recent Labs     07/28/22  0537 07/27/22  0338   Sodium 143 138   Potassium 3.9 4.0   Chloride 103 99   CO2 25 23   BUN 41* 61*   Creatinine 3.9* 4.7*   Glucose 81 182*   Calcium 8.7 8.9     No results for input(s): "AST", "ALT", "ALKPHOS", "PROT",  "ALB" in the last 72 hours.      Current Meds:      Scheduled Meds: PRN Meds:    acetaminophen, 650 mg, Oral, 4 times per day   Or  acetaminophen, 650 mg, Oral, 4 times per day  amiodarone, 200 mg, per NG tube, Daily  apixaban, 2.5 mg, Oral, Q12H SCH  carvedilol, 12.5 mg, Oral, Q12H SCH  darbepoetin alfa, 0.45 mcg/kg, Subcutaneous, Weekly  glycopyrrolate, 0.2 mg, Intravenous, Q8H  insulin glargine, 7 Units, Subcutaneous, QAM  insulin lispro, 1-8 Units, Subcutaneous, Q6H  pantoprazole, 40 mg, per NG tube, Q12H SCH  thiamine, 100 mg, per NG tube, Daily        Continuous Infusions:     sodium chloride, , PRN  acetaminophen, 650 mg, Q6H PRN  albuterol-ipratropium, 3 mL, Q6H PRN  benzocaine, 1 spray, TID PRN  dextrose, 15 g of glucose, PRN   Or  dextrose, 12.5 g, PRN   Or  dextrose, 12.5 g, PRN   Or  glucagon (rDNA), 1 mg, PRN  diphenhydrAMINE, 25 mg, Q6H PRN  hydrALAZINE, 10 mg, Q3H PRN  labetalol, 10 mg, Q15 Min PRN  lidocaine 2% jelly, , Once PRN  naloxone, 0.2 mg, PRN  ondansetron, 4 mg, Q6H PRN  oxyCODONE, 2.5 mg, Q4H PRN  phenol, 1 spray, Q2H PRN          Leontyne Manville A. Janalyn Rouse, M.D.  07/28/2022  8:26 AM

## 2022-07-28 NOTE — Progress Notes (Addendum)
[facility]  PROGRESS NOTE      Patient: Shelby Barron  Date: 07/28/2022   LOS: 30 Days  Admission Date: 06/28/2022   MRN: [id number]  Attending: Melina Fiddler, MD  When on service as the attending, please contact me on Epic Secure Chat from 7 AM - 7 PM for non-urgent issues. For urgent matters use XTend page from 7 AM - 7 PM.       ASSESSMENT/PLAN     Shelby Barron is a 82 y.o. female admitted with Bladder cancer    Toxic metabolic Encephalopathy/delirium resolving  - Patient was upgraded to ICU on 6/30 for lethargy and decreased responsiveness  - CT head was repeated on 6/29, no acute finding  - ABG consistent with respiratory alkalosis  - EEG consistent with encephalopathy no seizure, MRI no acute stroke  - CT abdomen with and without contrast after premedication did not show any leak or abscess  - SLP advanced diet to pureed.   -NG tube removed 7/9  -SLP following daily  - Palliative consulted:  Family is clear they would like no limits to care and patient should remain full code.     Anemia, 2/2 CKD and blood loss during OR  - TSAT 18%  - continue Aranesp per nephrology  - Trend CBC, transfuse Hgb<7. Status post 1U pRBC for Hgb 6.1 on 7/7 with improvement   -H&H has been stable around 8.5  No active signs of bleeding    s/p open cystectomy, bilateral nephrectomies, and resection of small bowel after intra-op enterotomy    Mixed shock - hemorrhagic vs septic: Resolved  Leukocytosis  ?  Necrotic wound tissue  -I reviewed the images from last wound care note on July 8 which showed some necrotic tissue    -I discussed with general surgery who agreed the patient needs debridement but needs to hold Eliquis for 2 days prior  Will hold Eliquis starting now and switch to heparin drip   - 6/10 small bowel enteretomy with 1L EBL. Went to the OR on June 12 for small bowel anastomosis, closure of mesenteric defect, closure of abdominal wall with wound VAC application. Wound VAC last changed on 7/8  - Surgery  consulted, recommend CTAP with PO contrast that showed no anastomotic leak. Low concern for abscesses as they'd expect these to have a more loculated appearance in the interim between 6/17 and 7/1 imaging  - ID consulted, completed meropenem and Mycamine for 2 weeks on 7/3.    -Discussed with ID and they recommend to monitor off antibiotic for now      Acute CVA on the right frontal lobe most likely from hypotension and shock  History of CVA  Petechial hemorrhage vs cortical laminar necrosis on CTH  - Left upper extremity not moving at all unclear duration  - Neurology following, initially started on ASA 81mg  only due to concern for risk of hemorrhagic conversion. Now OK to switch to Eliquis for Afib and discontinue aspirin per neurology on 7/5 as > 10d post event.  - SBP goal 120-160  - Carotid Doppler showed plaque with no critical stenosis  - Head CT on 7/7 w/ a few areas of cortical hyperdensity - petechial hemorrhage vs cortical laminar necrosis. Discussed with neuro OK to continue eliquis for now. Plan for repeat CTH today. Neuro exam stable.  - EEG encephalopathic pattern no seizure, repeat MRI no new infarction  - PT OT recommend SNF  -Repeat CT head on 07/26/2022  stable     Acute hypoxemic respiratory failure resolved  COPD  Extubated on 6/13, currently on room air  Maintain SaO2 > 92%  Continue as needed neb treatment  X-ray repeated on 6/29 showed resolution of interstitial edema     ESRD  S/p CRRT this admission, now on HD TTS  Continue dialysis per nephrology  IR removed the nonfunctional permacath and right Quinton IJ cath and replaced with a new permacath on 6/26.     HTN  On metoprolol   PRN IV labetalol for SBP > 180      New onset Afib with RVR, improved  - CHA2DS2-VASc score is 7  - Status post amiodarone drip. Now stable on oral amiodarone and coreg per cardiology recommendations  - GI and general surgery cleared for anticoagulation. Neurology recommended to hold anticoagulation initiation for 10  days given the size of stroke.   -  started on Eliquis since 07/23/22  Hold Eliquis starting today 7/10 for 2 days prior to wound debridement     Renal cell carcinoma outpatient follow-up hematology oncology  Urothelial carcinoma  Thrombocytopenia  - VTE ppx: SCDs, eliquis     Diabetes Mellitus type 2  -Sliding scale insulin                        Nutrition: Pured diet as tolerated  Malnutrition Documentation    Moderate Malnutrition related to inadequate nutritional intake in the setting of acute illness as evidenced by <50% EER > 5 days, mild muscle losses (temporalis, deltoid) - new                 Patient has BMI=Body mass index is 36.49 kg/m.  Diagnosis: Obesity based on BMI criteria           Recent Labs   Lab 07/28/22  0537 07/27/22  0338 07/26/22  0558   Hemoglobin 7.2* 8.5* 8.7*   Hematocrit 22.1* 25.2* 26.6*   MCV 91.7 90.0 90.2   WBC 10.31* 11.67* 13.02*   Platelet Count 338 436* 445*     Recent Labs   Lab 07/18/22  0421 06/02/22  0514   Ferritin 2,300.21*  --    Iron 18* 62   TIBC 99* 145*   Iron Saturation 18 43     Anemia Diagnosis: Already documented in note    Code Status: Full Code    Dispo: SNF    Family Contact: Daughter [name]     DVT Prophylaxis:   Current Facility-Administered Medications (Includes Only Anticoagulants, Misc. Hematological)   Medication Dose Route Last Admin    [Held by provider] apixaban (ELIQUIS) tablet 2.5 mg  2.5 mg Oral 2.5 mg at 07/28/22 0957          CHART  REVIEW & DISCUSSION     The following chart items were reviewed as of 3:44 PM on 07/28/22:  [x]  Lab Results [x]  Imaging Results   [x]  Problem List  [x]  Current Orders [x]  Current Medications  []  Allergies  [x]  Code Status [x]  Previous Notes   []  SDoH    The management and plan of care for this patient was discussed with the following specialty consultants:  []  Cardiology  []  Gastroenterology                 [x]  Infectious Disease  []  Pulmonology []  Neurology                [x]  Nephrology  []  Neurosurgery []   Orthopedic  Surgery  []  Heme/Onc  [x]  General Surgery []  Psychiatry                                   []  Palliative    SUBJECTIVE     Shelby Barron reports she has abdominal pain no fever no chills no shortness of breath no chest pain no cough no headache no dizziness  MEDICATIONS     Current Facility-Administered Medications   Medication Dose Route Frequency    acetaminophen  650 mg Oral 4 times per day    Or    acetaminophen  650 mg Oral 4 times per day    amiodarone  200 mg per NG tube Daily    [Held by provider] apixaban  2.5 mg Oral Q12H [facility]    carvedilol  12.5 mg Oral Q12H [facility]    darbepoetin alfa  0.45 mcg/kg Subcutaneous Weekly    glycopyrrolate  0.2 mg Intravenous Q8H    insulin glargine  7 Units Subcutaneous QAM    insulin lispro  1-8 Units Subcutaneous Q6H    pantoprazole  40 mg per NG tube Q12H [facility]    thiamine  100 mg per NG tube Daily       PHYSICAL EXAM     Vitals:    07/28/22 1418   BP:    Pulse: 85   Resp:    Temp:    SpO2:        Temperature: Temp  Min: 98.1 F (36.7 C)  Max: 99.1 F (37.3 C)  Pulse: Pulse  Min: 78  Max: 92  Respiratory: Resp  Min: 16  Max: 20  Non-Invasive BP: BP  Min: 109/57  Max: 152/60  Pulse Oximetry SpO2  Min: 93 %  Max: 98 %    Intake and Output Summary (Last 24 hours) at Date Time    Intake/Output Summary (Last 24 hours) at 07/28/2022 1544  Last data filed at 07/27/2022 1744  Gross per 24 hour   Intake 350 ml   Output --   Net 350 ml         GEN APPEARANCE: Alert, awake and oriented x 1  HEENT: PERLA; EOMI; Conjunctiva Clear  NECK: Supple; No bruits  CVS: RRR, S1, S2; No M/G/R  LUNGS: CTAB; , no Rhonchi: No rales  ABD: Soft; No TTP; + Normoactive BS vertical abdominal wound and wound VAC in place  EXT: No edema; Pulses 2+ and intact  NEURO: moving right upper extremity spontaneously, able to move lower extremities for pain but minimal, no movement at all on the left upper extremity      LABS     Recent Labs   Lab 07/28/22  0537 07/27/22  0338 07/26/22  0558   WBC 10.31* 11.67* 13.02*    RBC 2.41* 2.80* 2.95*   Hemoglobin 7.2* 8.5* 8.7*   Hematocrit 22.1* 25.2* 26.6*   MCV 91.7 90.0 90.2   Platelet Count 338 436* 445*       Recent Labs   Lab 07/28/22  0537 07/27/22  0338 07/26/22  0558 07/25/22  0351 07/24/22  1129   Sodium 143 138 138 137 140   Potassium 3.9 4.0 4.0 3.7 3.7   Chloride 103 99 99 100 102   CO2 25 23 23 23 26    BUN 41* 61* 45* 29* 21   Creatinine 3.9* 4.7* 3.8* 2.5* 2.0*   Glucose 81 182*  170* 163* 99   Calcium 8.7 8.9 8.7 8.0 8.4   Magnesium 1.9 2.0 2.0 1.8 1.8                         Microbiology Results (last 15 days)       Procedure Component Value Units Date/Time    Culture, Blood, Aerobic And Anaerobic [[id number]] Collected: 07/18/22 1400    Order Status: Completed Specimen: Blood, Venous Updated: 07/23/22 2200     Culture Blood No growth at 5 days    Culture, Blood, Aerobic And Anaerobic [[id number]] Collected: 07/18/22 1400    Order Status: Completed Specimen: Blood, Venous Updated: 07/23/22 2200     Culture Blood No growth at 5 days             RADIOLOGY     CT Head WO Contrast    Result Date: 07/26/2022   1.No significant change in multifocal areas of subacute infarction within bilateral cerebral hemispheres. 2.Small areas of petechial hemorrhage in the left parietal and occipital lobes are unchanged. No new hemorrhage or measurable parenchymal hematoma. 3.Persistent complete opacification of the right mastoid air cells and middle ear cavity. Garnette Gunner, MD 07/26/2022 12:27 PM    CT Head WO Contrast    Result Date: 07/25/2022   1.Redemonstration of multifocal areas of bilateral acute infarctions. A few areas of cortical hyperdensity likely represent petechial hemorrhage. No mass occupying hematoma. No significant mass effect or midline shift. 2.Redemonstrated right mastoid and right middle ear cavity effusion with areas of bony thinning versus dehiscence of the right tegmen tympani and right tegmen mastoideum. Vara Guardian, MD 07/25/2022 2:19 PM    CT Abdomen Pelvis W IV And PO  Cont    Result Date: 07/19/2022   1.Slight interval increase in left-sided retroperitoneal fluid collection with the other abdominal fluid collections appearing relatively stable. 2. There is no definite abscess or acute inflammatory process. 3. Incidental note is made of a small paraesophageal type hernia. 4. Sigmoid diverticulosis is noted without CT evidence of diverticulitis. 5. Other findings as noted above. Miguel Dibble, MD 07/19/2022 6:36 AM    MRI Brain WO Contrast    Result Date: 07/18/2022   Interval evolution of multifocal recent infarcts in the bilateral cerebral hemispheres. No new infarct. Milan Waunita Schooner, MD 07/18/2022 5:57 PM    XR Chest AP Portable    Result Date: 07/17/2022  1.Near-complete resolution of interstitial edema. 2.Improving pulmonary vascular congestion. Legrand Pitts, MD 07/17/2022 2:38 PM    CT Head WO Contrast    Result Date: 07/17/2022   1.No new acute intracranial pathology. 2.There are multiple regions of cytotoxic edema in the right > left frontal and left > right occipital lobes, consistent with known early subacute infarcts. Dannielle Burn 07/17/2022 8:26 AM    US Carotid Duplex Dopp Comp Bilateral    Result Date: 07/15/2022   1. Plaque formation in the right internal carotid artery bulb with elevated velocities. Stenosis is estimated at 50-69%. 2. Plaque in the left internal carotid artery with normal velocities. Any stenosis is less than 50%. Criteria for stenosis and velocity parameters are based on the guidelines of the SRU (Society of Radiologists in Ultrasound). Grant et al: Carotid Artery Stenosis: Grayscale and Doppler Ultrasound Diagnosis-- Society of Radiologists in Ultrasound Consensus Conference Ultrasound Quarterly. Volume 19, Number 4; December, 2003. Recommendations updated October 2021 Verne Carrow is an [facility] accredited facility Suszanne Finch, MD 07/15/2022 9:41 AM    Tunneled  Cath Removal (Permcath)    Result Date: 07/14/2022  1. Nonfunctioning tunneled dialysis catheter as  described above. 2. Successful placement of a new 19 cm Equistream catheter as described above. Dimitrios Papadouris, MD 07/14/2022 4:06 PM    Tunneled Cath Placement (Permcath)    Result Date: 07/14/2022  1. Nonfunctioning tunneled dialysis catheter as described above. 2. Successful placement of a new 19 cm Equistream catheter as described above. Dimitrios Papadouris, MD 07/14/2022 4:06 PM    MRI Brain WO Contrast    Result Date: 07/13/2022   1.Patchy areas of acute/recent infarction involving the cerebral hemispheres bilaterally. The infarcts have a configuration suggesting watershed territory infarcts. 2.Chronic ischemic changes as discussed. Georgann Housekeeper, MD 07/13/2022 10:28 PM    CT Head WO Contrast    Result Date: 07/13/2022   1.New region of loss of gray-white matter differentiation in the right frontal lobe, concerning for acute infarction. 2.A few additional areas of poor gray-white matter differentiation for example in the left occipital lobe may be artifactual but could be correlated with MRI. 3.No evidence of acute intracranial hemorrhage or significant mass effect. These urgent results were discussed with and acknowledged by Dr. Don Broach on 07/13/2022 3:20 PM. Vara Guardian, MD 07/13/2022 3:21 PM    XR Chest AP Portable    Result Date: 07/10/2022  Interstitial markings have slightly increased, suggesting minimal edema. No other change. Wilmon Pali, MD 07/10/2022 4:49 PM    XR Chest AP Portable    Result Date: 07/07/2022   There is mild interstitial prominence suggesting mild congestive heart failure/fluid overload in the setting of shortness of breath [name], MD 07/07/2022 12:32 PM    XR Chest AP Portable    Result Date: 07/06/2022   Hypoventilation with basilar atelectasis. Charlott Rakes, MD 07/06/2022 3:35 PM    CT Abdomen Pelvis W IV And PO Cont    Result Date: 07/06/2022  1.Postoperative changes from bilateral nephrectomy and cystectomy. 2.Small amount of ascites including some areas which appear loculated.  Small collections of fluid and gas in the anterior midline abdomen may be postoperative but superimposed infection is not excluded. No rim-enhancing fluid collections or extravasation of oral contrast to suggest anastomotic leak. Demetrios Isaacs, MD 07/06/2022 5:51 AM    CT Head WO Contrast    Result Date: 07/06/2022   1.No acute intracranial abnormality is seen. Leandro Reasoner, MD 07/06/2022 3:56 AM    XR Abdomen AP    Result Date: 07/05/2022  Enteric tube terminates in the distal stomach. Elease Etienne, DO 07/05/2022 12:24 PM    XR Chest AP Portable    Result Date: 06/30/2022  1. Lines and tubes in adequate position. No pneumothorax. 2. Trace left pleural effusion and bibasilar atelectasis. Jasmine December D'Heureux, MD 06/30/2022 4:53 PM    XR Chest AP Portable    Result Date: 06/30/2022  Catheters as described. Pankaj Dominica, MD 06/30/2022 2:07 PM    XR Chest AP Portable    Result Date: 06/28/2022   Right mainstem bronchial intubation. The tube should be withdrawn about 4 cm. Dr. Tildon Husky is aware. Wynema Birch, MD 06/28/2022 4:56 PM   Echo Results       None          Results for orders placed or performed during the hospital encounter of 06/28/22   MRI Brain WO Contrast    Narrative    HISTORY: Declining mental status, right gaze deviation.     COMPARISON: CT head 07/17/2022 and MRI brain 07/13/2022.  TECHNIQUE: MRI of the brain performed on a 1.5 Tesla scanner without  intravenous contrast.    CONTRAST: None.    FINDINGS:     Interval evolution of multifocal recent infarcts with associated edema in  the bilateral cerebral hemispheres. No new infarct. Mild chronic small  vessel ischemic changes within the cerebral white matter. Mild generalized  cerebral volume loss.     No acute hemorrhage, extra-axial fluid collection, or midline shift. No  mass lesion or vasogenic edema.     Normal appearance of the sella. Cerebellar tonsils are appropriately  positioned. No acute hydrocephalus. Patent basal cisterns. Skull base flow  voids are  intact.    No osseous abnormality. No acute orbital abnormality. Status post bilateral  lens replacement. No significant paranasal sinus mucosal disease. Severe  opacification of the right mastoid air cells and middle ear cavity. Mild  opacification of left mastoid air cells.       Impression         Interval evolution of multifocal recent infarcts in the bilateral cerebral  hemispheres. No new infarct.    Milan Waunita Schooner, MD  07/18/2022 5:57 PM   CT Head WO Contrast    Narrative    HISTORY: Petechial hemorrhage follow up.     COMPARISON: Multiple priors most recent CT 07/25/2022    TECHNIQUE: CT of the head performed without intravenous contrast. The  following dose reduction techniques were utilized: automated exposure  control and/or adjustment of the mA and/or KV according to patient size,  and the use of an iterative reconstruction technique.    CONTRAST: None.    FINDINGS:  Multifocal areas of subacute infarction within bilateral cerebral  hemispheres remain similar to prior. Small areas of petechial hemorrhage in  the left parietal and occipital lobes are unchanged. No new hemorrhage or  measurable parenchymal hematoma.    There is a background moderate chronic microangiopathic changes in the  periventricular white matter. Moderate generalized volume loss. No  extra-axial fluid collection or midline shift. Ventricles and basal  cisterns are patent.    Persistent complete opacification of the right mastoid air cells and middle  ear cavity. Paranasal sinuses are clear.      Impression       1.No significant change in multifocal areas of subacute infarction within  bilateral cerebral hemispheres.  2.Small areas of petechial hemorrhage in the left parietal and occipital  lobes are unchanged. No new hemorrhage or measurable parenchymal hematoma.  3.Persistent complete opacification of the right mastoid air cells and  middle ear cavity.    Garnette Gunner, MD  07/26/2022 12:27 PM       Signed,  Melina Fiddler,  MD  3:44 PM 07/28/2022

## 2022-07-28 NOTE — Plan of Care (Signed)
NURSING SHIFT NOTE     Patient: Shelby Barron  Day: 30      SHIFT EVENTS     Shift Narrative/Significant Events (PRN med administration, fall, RRT, etc.):     Pt A&Ox1, able to follow commands, responds to voice. RA, on tele showing NSR. Pt does not look like in any acute distress this shift, PRN oxycodone given once, pt looks more comfortable after administration. PRN tylenol given once for temp of 99, temp WNL after reassessment. Incontinence care and wound care complete. Surgery consulted pt. Heparin drip started. Family updated with plan of care. Plan of care ongoing    Safety and fall precautions remain in place. Purposeful rounding completed.          ASSESSMENT     Changes in assessment from patient's baseline this shift:    Neuro: No  CV: No  Pulm: No  Peripheral Vascular: No  HEENT: No  GI: No  BM during shift: Yes   , Last BM: Last BM Date: 07/26/22  GU: No   Integ: No  MS: No    Pain: Improved  Pain Interventions: Medications, Rest  Medications Utilized:     Mobility: PMP Activity: Step 1 - Bedrest of Distance Walked (ft) (Step 6,7): 0 Feet           Lines     Patient Lines/Drains/Airways Status       Active Lines, Drains and Airways       Name Placement date Placement time Site Days    Permacath Catheter - Tunneled 07/14/22 Subclavian vein catheter Right 07/14/22  1404  -- 14    Peripheral IV 07/25/22 20 G Right Antecubital 07/25/22  1700  Antecubital  3    Peripheral IV 07/25/22 22 G Anterior;Left Forearm 07/25/22  1700  Forearm  3    Midline IV 07/01/22 Anterior;Left Upper Arm 07/01/22  1315  Upper Arm  27                         VITAL SIGNS     Vitals:    07/28/22 1602   BP: 125/64   Pulse: 84   Resp: 22   Temp: 98.6 F (37 C)   SpO2: 95%       Temp  Min: 98.1 F (36.7 C)  Max: 99 F (37.2 C)  Pulse  Min: 78  Max: 90  Resp  Min: 16  Max: 22  BP  Min: 109/57  Max: 152/60  SpO2  Min: 93 %  Max: 98 %      Intake/Output Summary (Last 24 hours) at 07/28/2022 2013  Last data filed at 07/28/2022  1835  Gross per 24 hour   Intake 250 ml   Output 350 ml   Net -100 ml              CARE PLAN        Problem: Moderate/High Fall Risk Score >5  Goal: Patient will remain free of falls  Outcome: Progressing  Flowsheets (Taken 07/28/2022 1519)  High (Greater than 13):   HIGH-Consider use of low bed   HIGH-Apply yellow "Fall Risk" arm band   HIGH-Bed alarm on at all times while patient in bed     Problem: Compromised Sensory Perception  Goal: Sensory Perception Interventions  Outcome: Progressing  Flowsheets (Taken 07/28/2022 1519)  Sensory Perception Interventions: Offload heels, Pad bony prominences, Reposition q 2hrs/turn Clock, Q2 hour skin assessment under devices if  present     Problem: Compromised Moisture  Goal: Moisture level Interventions  Outcome: Progressing  Flowsheets (Taken 07/28/2022 1519)  Moisture level Interventions: Moisture wicking products, Moisture barrier cream     Problem: Compromised Activity/Mobility  Goal: Activity/Mobility Interventions  Outcome: Progressing  Flowsheets (Taken 07/28/2022 1519)  Activity/Mobility Interventions: Pad bony prominences, TAP Seated positioning system when OOB, Promote PMP, Reposition q 2 hrs / turn clock, Offload heels     Problem: Compromised Nutrition  Goal: Nutrition Interventions  Outcome: Progressing  Flowsheets (Taken 07/28/2022 1519)  Nutrition Interventions: Discuss nutrition at Rounds, I&Os, Document % meal eaten, Daily weights     Problem: Compromised Friction/Shear  Goal: Friction and Shear Interventions  Outcome: Progressing     Problem: Inadequate Airway Clearance  Goal: Patent Airway maintained  Flowsheets (Taken 07/28/2022 1519)  Patent airway maintained: Position patient for maximum ventilatory efficiency  Goal: Normal respiratory rate/effort achieved/maintained  Outcome: Progressing     Problem: Infection Prevention  Goal: Free from infection  Outcome: Pr  Problem: Pain  Goal: Pain at adequate level as identified by patient  Outcome:  Progressing  Flowsheets (Taken 07/28/2022 1519)  Pain at adequate level as identified by patient:   Identify patient comfort function goal   Assess for risk of opioid induced respiratory depression, including snoring/sleep apnea. Alert healthcare team of risk factors identified.   Assess pain on admission, during daily assessment and/or before any "as needed" intervention(s)   Reassess pain within 30-60 minutes of any procedure/intervention, per Pain Assessment, Intervention, Reassessment (AIR) Cycle   Evaluate if patient comfort function goal is met     Problem: Side Effects from Pain Analgesia  Goal: Patient will experience minimal side effects of analgesic therapy  Outcome: Progressing     Problem: Hemodynamic Status: Cardiac  Goal: Stable vital signs and fluid balance  Outcome: Progressing     Problem: Every Day - Stroke  Goal: Mobility/Activity is maintained at optimal level for patient  Outcome: Progressing    Problem: Impaired Mobility  Goal: Mobility/Activity is maintained at optimal level for patient  Outcome: Progressing     Problem: Constipation  Goal: Fluid and electrolyte balance are achieved/maintained  Outcome: Progressing  Goal: Elimination patterns are normal or improving  Outcome: Progressing     Problem: Safety  Goal: Patient will be free from injury during hospitalization  Outcome: Progressing  Goal: Patient will be free from infection during hospitalization  Outcome: Progressing

## 2022-07-28 NOTE — SLP Progress Note (Signed)
[facility]  Speech Therapy Treatment Note    Patient:  Shelby Barron MRN#:  [id number]  Unit:  [facility] [address] [facility] Room/Bed:  W4132/G4010.A      Time of treatment:   SLP Received On: 07/28/22  Start Time: 0920  Stop Time: 0940  Time Calculation (min): 20 min       Personal Protective Equipment (PPE)  Gloves and Procedure Mask with Shield    Subjective:   Pt is alert/ verbal , confused , occasionally will follow some commands         Objective: Oropharyngeal analysis during PO trials in order to determine LRD,development and training of swallow strategies in order to improve swallow function and decrease risk of penetration/aspiration;  therapeutic feedings by SLP  and safe swallowing precautions education.       Assessment: pt presents with mild oral pharyngeal dysphagia characterized by delay in bolus transit , delay in pharyngeal swallow trigger and fairly adequate HLE upon subjective digital palpation. Pt tolerated 100% of breakfast and 4 fl oz of Ensure via straw without s/s overt aspiration or penetration         Plan/Recommendations:  IDDSI Pureed - PU4  and Mildly thick  MT2  Slow feeding rate, small bites / sips , upright position during and after the meals , Alternate solid and liquid , Reflux precautions , full assistance with meals , and check oral cavity for residue         Charges Minutes   SLP treat    Swallow treat 20       07/28/2022  Presley Raddle MS [facility] SLP   07/28/2022 2:40 PM  [phone]

## 2022-07-29 LAB — BASIC METABOLIC PANEL
Anion Gap: 14 (ref 5.0–15.0)
BUN: 59 mg/dL — ABNORMAL HIGH (ref 7–21)
CO2: 25 mEq/L (ref 17–29)
Calcium: 9.5 mg/dL (ref 7.9–10.2)
Chloride: 103 mEq/L (ref 99–111)
Creatinine: 5.3 mg/dL — ABNORMAL HIGH (ref 0.4–1.0)
GFR: 7.5 mL/min/{1.73_m2} — ABNORMAL LOW (ref >=60.0)
Glucose: 105 mg/dL — ABNORMAL HIGH (ref 70–100)
Potassium: 4.4 mEq/L (ref 3.5–5.3)
Sodium: 142 mEq/L (ref 135–145)

## 2022-07-29 LAB — CROSSMATCH PRBC, 1 UNIT
Expiration Date: 202408022359
Product Status: TRANSFUSED
Unit Blood Type: O POS

## 2022-07-29 LAB — APTT
PTT: 78 s — ABNORMAL HIGH (ref 27–39)
PTT: 50 s — ABNORMAL HIGH (ref 27–39)
PTT: 43 s — ABNORMAL HIGH (ref 27–39)

## 2022-07-29 LAB — WHOLE BLOOD GLUCOSE POCT
Whole Blood Glucose POCT: 106 mg/dL — ABNORMAL HIGH (ref 70–100)
Whole Blood Glucose POCT: 105 mg/dL — ABNORMAL HIGH (ref 70–100)
Whole Blood Glucose POCT: 147 mg/dL — ABNORMAL HIGH (ref 70–100)
Whole Blood Glucose POCT: 116 mg/dL — ABNORMAL HIGH (ref 70–100)
Whole Blood Glucose POCT: 96 mg/dL (ref 70–100)

## 2022-07-29 LAB — CBC
Absolute nRBC: 0 10*3/uL (ref <=0.00)
Hematocrit: 22.7 % — ABNORMAL LOW (ref 34.7–43.7)
Hemoglobin: 7.4 g/dL — ABNORMAL LOW (ref 11.4–14.8)
MCH: 30 pg (ref 25.1–33.5)
MCHC: 32.6 g/dL (ref 31.5–35.8)
MCV: 91.9 fL (ref 78.0–96.0)
MPV: 9.8 fL (ref 8.9–12.5)
Platelet Count: 365 10*3/uL — ABNORMAL HIGH (ref 142–346)
RBC: 2.47 10*6/uL — ABNORMAL LOW (ref 3.90–5.10)
RDW: 16 % — ABNORMAL HIGH (ref 11–15)
WBC: 10.85 10*3/uL — ABNORMAL HIGH (ref 3.10–9.50)
nRBC %: 0 /100 WBC (ref <=0.0)

## 2022-07-29 LAB — MAGNESIUM: Magnesium: 2.1 mg/dL (ref 1.6–2.6)

## 2022-07-29 LAB — ANTI-XA,UFH
Anti-Xa, UFH: 0.93 IU/mL
Anti-Xa, UFH: 0.63 IU/mL
Anti-Xa, UFH: 0.42 IU/mL

## 2022-07-29 LAB — PHOSPHORUS: Phosphorus: 3.3 mg/dL (ref 2.3–4.7)

## 2022-07-29 MED ORDER — SODIUM CHLORIDE 0.9 % IV BOLUS
250.0000 mL | INTRAVENOUS | Status: AC | PRN
Start: 2022-07-29 — End: 2022-07-29

## 2022-07-29 MED ORDER — SODIUM CHLORIDE 0.9 % IV BOLUS
100.0000 mL | INTRAVENOUS | Status: AC | PRN
Start: 2022-07-29 — End: 2022-07-29

## 2022-07-29 MED ORDER — PRENATAL PLUS IRON 29-1 MG PO TABS
1.0000 | ORAL_TABLET | Freq: Every day | ORAL | Status: DC
Start: 2022-07-29 — End: 2022-07-30
  Administered 2022-07-29: 1 via ORAL
  Filled 2022-07-29 (×3): qty 1

## 2022-07-29 MED ORDER — INSULIN GLARGINE 100 UNIT/ML SC SOLN
5.0000 [IU] | Freq: Every morning | SUBCUTANEOUS | Status: DC
Start: 2022-07-30 — End: 2022-08-05
  Administered 2022-08-02: 5 [IU] via SUBCUTANEOUS
  Filled 2022-07-29 (×2): qty 5

## 2022-07-29 MED ORDER — ALBUMIN HUMAN 25 % IV SOLN
100.0000 mL | INTRAVENOUS | Status: AC | PRN
Start: 2022-07-29 — End: 2022-07-29

## 2022-07-29 NOTE — Progress Notes (Signed)
Wound Ostomy Continence Progress Note    Date Time: 07/29/22 10:14 AM  Patient Name: Shelby Barron, Shelby Barron  Consulting Service: [facility] Day: 32        Vac change    Pertinent skin and/or ostomy history includes   Acid reflux, Asthma, well controlled, Cancer of kidney, Chronic lower back pain, Congestive heart disease, COPD (chronic obstructive pulmonary disease), CVA (cerebral infarction) (01/12/2003), Diabetes, Diabetic nephropathy, Fibroids, Gout, H/O: gout, Hyperlipidemia, Hypertension, Neuropathy of hand, and SOB (shortness of breath)..  Admitted with Bladder cancer.       Assessment:      Wound Type and Location:  Surgical Wound Mid Abdomen  Dimensions: 26 cm x 6 cm x 5.8 cm  Wound Bed:  Full thickness tissue loss with beefy red granulation tissue along wound edges. Granulation tissue 60% Nongranulation tissue 15% Slough 35%   Undermining:no   Tunneling: no  Peri Wound/ Edges:Right lower abdomen wound between 6 and 9 O'clock peri wound, partial thickness skin loss.  Noted decrease in slough/eschar in wound bed  area measuring 4 cm x 6 cm (originally wound from skin tear from vac drape)   Drainage:  brownish serosanguinous  Pain:  yes gets prn med  Odor:  none       Dr.  at bedside spoke at length regarding necrotic/slough in wound bed and peri wound. Stated that Lesia Hausen has been on hold and although patient getting heparin hope to have surgeon come and debride wound bed and peri wound. Patient received prn  dose of pain medication for use during wound vac changes.     Wound care to mid abdomen/ Vac Dressing Change     Area cleansed with Vashe wash (soaked for 5-10 minutes ) and  patted dry.    Applied  No-Sting skin barrier film to peri-wound and allowed to dry.   Placed Vac drape around peri-wound window paned style, to prevent maceration and ap[plied Eakin's ring around umbilicus, top of wound and along the pannus and  curvature of pannus for more adherence.     Medical Adhesive Related Skin Injury   (MARSI)- Patient has partial thickness skin loss,  peri wound from vac drape between 6 and 9 O'clock peri wound.  Noted that peri wound area around wound is worsening.  Applied Plurogel directly to wound bed after cleansing and covered with silicone dressing.  This was topped with vac drape.      Packed two pieces of silver non adherent dressing and top with  two pieces of white foam and 2 pieces of of black foam into a wound  Secured with vac drape.   A quarter size cut hole was cut into the black foam to fit the trackpad.   Good seal obtained and wound vacuum setting at 125 mm Hg, low intensity continuous.  Aseptic technique utilized patient called out for  "Aunt Mattie "able to calm and comfort patient.  PRN pain medication given.        Vashe Wound Solution helps to cleanse the wound and accomplish the goals of wound bed preparation in a biocompatible, safe, effective, and natural way. This solution of hypochlorous acid (HOCl) acts as a preservative that inhibits microbial contamination within the solution. Vashe Wound Solution has the highest concentration of pure HOCl.     Vashe comes from supply rooms or central supply.         Plurogel- contains a non -cytotoxic surfactant that aids in maintaining a moist wound healing environment which  promotes autolytic debridement. Indicated for use on a partial thickness and full thickness lightly to moderately draining wounds, 1st, and second-degree burn.      Plurogel is in the wound care office.      Moisture Associated Skin Damage- staff reported skin tear. Partial thickness skin loss to Left buttock.  Recommend use of Triad to site.  Partial thickness skin loss reported ( did not visualize) patient in great deal of pain after vac application and with pain med. Recommend treatment per policy .  Wound care:  1. Clean wound with wound cleanser or incontinence wipes.  2. Apply a layer of Triad (thickness of a dime) directly on the wound. If Triad not sticking to wound  bed, pat to dry and reapply.  3. Keep open to air.     Triad Hydrophilic Wound Dressing is a Zinc-oxide based hydrophilic paste for light-to- moderate levels of wound exudates. Helps maintain an optimal wound healing environment to facilitate natural autolytic debridement. Ideal alternative for difficult-to-dress areas and varying wound etiologies.     Triad comes from the supply rooms or central supply.     Plan     Plan:   Continue pressure prevention bundle:     Head of the bed 30 degrees or less  Positioning device to the bedside  Eliminate/minimize pressure from the area  Float heels with boots or pillows  Turn patient  Pressure redistribution cushion to the chair  Use lift sheets/low friction surface sheets for positioning.  Pad bony prominences  Nutrition consult/Optimize nutrition  Initiate bed algorithm/Specialty bed  Moisture/Incontinence management - Cleanse with incontinence cleansing wipe/water to manage incontinence and protect skin from exposure to urine and stool. Apply skin barrier protection cream. Apply [place]/external or female external urine management system to prevent urinary contamination of a wound. Apply rectal pouch/fecal management system per unit policy, to prevent fecal contamination of a wound or contain diarrhea.            1.  Wound care orders entered into EMR.   2.  Care rendered.  3.  RN checks wound VAC Q 2 hr: Sponge contracted;  pump "ON" and plugged in.    4.  If unable to maintain the seal, apply Tegaderm dressing or vac drape to seal. May also contact [facility] at 1 - [phone] to troubleshoot.  5.  If still unable to maintain the seal and vac off or not working for 2 hours, remove all foam and place hydrogel moistened gauze into wound bed, cover with ABD pad secured with tape, and notify Physician and WOC team.          6.  Change two-to three times a week. Next vac change due on Monday.     When patient discharged or VAC discontinued -   Remove all foam dressing and non  adherent dressing and place hydrogel moistened gauze into the wound bed, cover with ABD pad tape to secure.   Wound Vac machine - discard canister and tubing.    Place wound vac in CLEAR plastic bag and place in the soiled utility room. DO NOT USE RED BIOHAZARD BAGS.    Please contact WOC office [phone] and leave a message for WOC to pick up.        Discharge VAC Wound Information     Prescribed [facility] VAC Therapy for duration:  4 months      Discharge VAC Wound Information  Surgical   mid abdomen  Age of wound: 06/28/22  Did VAC initiate at the hospital?     yes         Initiated - 06/28/22  Compromised Nutritional status? no Nutrition consulted for wound care needs.  Other therapies previously tried: no.  Is there eschar present in the wound: no  Has wound been debrided in the last ten days:   no  Are serial debridements required: no  Location: mid abdomen  Measurements: Date- 07/29/22 see above                            Length- 26  cm                            Width- 6 cm                            Depth-  5.8 cm                            Undermining- no  cm                            Tunneling-  no cm  The appearance of wound bed and color: red, pink, brown  Exudate: serosanguinous  Is wound full-thickness: yes  Is bone, tendon or muscle exposed: yes  Is there undermining no  Is there tunneling no  Type of Foam used: black simplace and white foam              Number of pieces used: 2 black and 2 white   Contact layer applied: silver non adherent  Continuous negative pressure setting: 125 mm Hg, low, intensity       Has the WOCN team attempted other dressings: no  WOCN assessed clinical reasons for the continuation of VAC at home:  Promotes wound healing, Speed granulation tissue, prevent infection       Wound Photography:     Objective Findings   Populated from Epic Documentation:  Specialty Bed: .Centrella Mattress (Med-Surg) - Innovative support surfaces help manage pressure, shear and moisture to deliver  optimal wound prevention and healing.     Braden: Braden Scale Score: 12 (07/28/22 2320)  Braden Subscales:  Sensory Perceptions: Very limited (07/28/22 2320)  Moisture: Occasionally moist (07/28/22 2320)  Activity: Bedfast (07/28/22 2320)  Mobility: Very limited (07/28/22 2320)  Nutrition: Adequate (07/28/22 2320)  Friction and Shear: Problem (07/28/22 2320)    Ht Readings from Last 1 Encounters:   07/18/22 1.575 m (5\' 2" )     Wt Readings from Last 3 Encounters:   07/29/22 97.4 kg (214 lb 11.2 oz)   06/25/22 87.1 kg (192 lb)   06/23/22 84.8 kg (187 lb)     Body mass index is 39.27 kg/m.    Current Diet:   Supervise For Meals Frequency: All meals  Glucerna Shake (8 Oz Cans) Quantity: A. One; Frequency: TID (3 times a day) with meals  Adult diet Therapeutic/ Modified; Solid; Pureed (IDDSI level 4); Mildly thick (IDDSI level 2); Room service: No     History of Present Illness   This is a 82 y.o. female  has a past medical history of Acid reflux, Asthma, well controlled, Cancer of kidney, Chronic lower back pain, Congestive heart disease, COPD (chronic obstructive pulmonary disease), CVA (cerebral infarction) (  01/12/2003), Diabetes, Diabetic nephropathy, Fibroids, Gout, H/O: gout, Hyperlipidemia, Hypertension, Neuropathy of hand, and SOB (shortness of breath)..  Admitted with Bladder cancer.      Past Surgical History:   Procedure Laterality Date    APPENDECTOMY (OPEN)      CLOSURE, ENTEROTOMY, SMALL INTESTINE  06/28/2022    Procedure: CLOSURE, ENTEROTOMY, SMALL INTESTINE;  Surgeon: Marlaine Hind, MD;  [facility]: [facility];  Service: General;;    CYSTECTOMY, ILEOCONDUIT N/A 06/28/2022    Procedure: CYSTECTOMY, RADICAL;  Surgeon: Neldon Newport, MD;  [facility]: [facility];  Service: Urology;  Laterality: N/A;    DRAIN (OTHER) N/A 05/31/2022    Procedure: DRAIN (OTHER);  Surgeon: Hope Pigeon, MD;  [facility]: [facility];  Service: Interventional Radiology;  Laterality: N/A;    EXPLORATORY LAPAROTOMY N/A  06/30/2022    Procedure: SMALL BOWEL ANASTOMOSIS, CLOSURE OF MESENTERIC DEFECT, CLOSURE OF ABDOMINAL WALL;  Surgeon: Marlaine Hind, MD;  [facility]: [facility];  Service: General;  Laterality: N/A;  **IP-2910.01/ MD AVAIL [id number] OR AFTER 1600**    EXPLORATORY LAPAROTOMY, RESECTION SMALL BOWEL N/A 06/28/2022    Procedure: RESECTION SMALL BOWEL;  Surgeon: Marlaine Hind, MD;  [facility]: [facility];  Service: General;  Laterality: N/A;    HERNIA REPAIR      HYSTERECTOMY      LAPAROTOMY, NEPHRECTOMY Bilateral 06/28/2022    Procedure: BILATERAL OPEN RADICAL NEPHRECTOMY AND LYSIS OF ASHESION;  Surgeon: Neldon Newport, MD;  [facility]: [facility];  Service: Urology;  Laterality: Bilateral;    PARTIAL NEPHRECTOMY      tumor removed from kidney  cJanuary 2015    TUNNELED CATH PLACEMENT (PERMCATH) N/A 06/01/2022    Procedure: TUNNELED CATH PLACEMENT;  Surgeon: Suszanne Finch, MD;  [facility]: [facility];  Service: Interventional Radiology;  Laterality: N/A;    TUNNELED CATH PLACEMENT (PERMCATH) Right 07/14/2022    Procedure: TUNNELED CATH PLACEMENT;  Surgeon: Hope Pigeon, MD;  [facility];  Service: Interventional Radiology;  Laterality: Right;  right IJ tunneled HD catheter placement    TUNNELED CATH REMOVAL (PERMCATH) Right 07/14/2022    Procedure: TUNNELED CATH REMOVAL;  Surgeon: Hope Pigeon, MD;  [facility]: [facility];  Service: Interventional Radiology;  Laterality: Right;  right neck/chest tunneled HD catheter removal    TURBT      WOUND VAC APPLICATION N/A 06/28/2022    Procedure: Tawni Pummel WOUND VAC APPLICATION;  Surgeon: Marlaine Hind, MD;  [facility]: [facility];  Service: General;  Laterality: N/A;    WOUND VAC APPLICATION N/A 06/30/2022    Procedure: WOUND VAC APPLICATION;  Surgeon: Marlaine Hind, MD;  [facility]: [facility];  Service: General;  Laterality: N/A;       Procedure(s) with comments:  TUNNELED CATH REMOVAL - right neck/chest tunneled HD catheter removal    31 Days  Post-Op  -------------------  **Canceled**    Procedure(s) with comments:  TUNNELED CATH REMOVAL - right neck/chest tunneled HD catheter removal    * No surgery date entered *  -------------------    Procedure(s) with comments:  TUNNELED CATH REMOVAL - right neck/chest tunneled HD catheter removal    29 Days Post-Op  -------------------    Procedure(s) with comments:  TUNNELED CATH REMOVAL - right neck/chest tunneled HD catheter removal    15 Days Post-Op  -------------------    Procedure(s) with comments:  TUNNELED CATH REMOVAL - right neck/chest tunneled HD catheter removal    15 Days Post-Op  -------------------  Lab   Significant Lab Values:  Recent Labs     07/29/22  0350   WBC 10.85*   RBC 2.47*   Hemoglobin 7.4*   Hematocrit 22.7*   Sodium 142   Potassium 4.4   Chloride 103   CO2 25   BUN 59*   Creatinine 5.3*   Calcium 9.5   GFR 7.5*   Glucose 105*           Mallika Sanmiguel "Pepper" Janeen Watson  MSN, RN, WOCN  [facility]  Wound, Ostomy, and  Continence Coordinator  [phone] Office  [phone] Spectral[employer]  [email or web address]

## 2022-07-29 NOTE — Progress Notes (Signed)
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 s/p bilateral radical nephrectomies and  cystectomy on 06/28/22  - complicated by small bowel enterotomy and blood loss   New onset afib - on amiodarone and eliquis  High grade urothelial bladder CA: s/p bilateral nephrectomies and cystectomy c/b mall bowel enterotomy and blood loss (~1L) on 06/28/22   - may have necrotic wound and needs I&D  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 Shakes her head to questions      Review of Systems:     No CP or abd pain    Physical Exam:     Vitals:    07/28/22 1602 07/28/22 2014 07/28/22 2323 07/29/22 0319   BP: 125/64 153/55 152/69 145/58   Pulse: 84 84 86 79   Resp: 22 17 18 17    Temp: 98.6 F (37 C) 98.6 F (37 C) 99 F (37.2 C) 99.5 F (37.5 C)   TempSrc: Oral Axillary Axillary Axillary   SpO2: 95% 93% 94% 96%   Weight:       Height:           Intake and Output Summary (Last 24 hours) at Date Time    Intake/Output Summary (Last 24 hours) at 07/29/2022 0513  Last data filed at 07/28/2022 1835  Gross per 24 hour   Intake 250 ml    Output 350 ml   Net -100 ml         General: awake, in no distress  Cardiovascular: regular rate and rhythm  Lungs: bilateral air entry  Abdomen: soft  Extremities: no edema  Access: R permacath     Meds:      Scheduled Meds: PRN Meds:    acetaminophen, 650 mg, Oral, 4 times per day   Or  acetaminophen, 650 mg, Oral, 4 times per day  amiodarone, 200 mg, per NG tube, Daily  [Held by provider] apixaban, 2.5 mg, Oral, Q12H SCH  carvedilol, 12.5 mg, Oral, Q12H SCH  darbepoetin alfa, 0.45 mcg/kg, Subcutaneous, Weekly  glycopyrrolate, 0.2 mg, Intravenous, Q8H  insulin glargine, 7 Units, Subcutaneous, QAM  insulin lispro, 1-8 Units, Subcutaneous, Q6H  pantoprazole, 40 mg, per NG tube, Q12H SCH  thiamine, 100 mg, per NG tube, Daily          Continuous Infusions:   heparin infusion 25,000 units/500 mL (Cardiac/Low Intensity) 11 Units/kg/hr (07/28/22 1755)  sodium chloride, , PRN  acetaminophen, 650 mg, Q6H PRN  albuterol-ipratropium, 3 mL, Q6H PRN  benzocaine, 1 spray, TID PRN  dextrose, 15 g of glucose, PRN   Or  dextrose, 12.5 g, PRN   Or  dextrose, 12.5 g, PRN   Or  glucagon (rDNA), 1 mg, PRN  diphenhydrAMINE, 25 mg, Q6H PRN  hydrALAZINE, 10 mg, Q3H PRN  labetalol, 10 mg, Q15 Min PRN  lidocaine 2% jelly, , Once PRN  naloxone, 0.2 mg, PRN  ondansetron, 4 mg, Q6H PRN  oxyCODONE, 2.5 mg, Q4H PRN  phenol, 1 spray, Q2H PRN              Labs:     Recent Labs   Lab 07/29/22  0350 07/28/22  1710 07/28/22  0537   WBC 10.85* 10.43* 10.31*   Hemoglobin 7.4* 7.7* 7.2*   Hematocrit 22.7* 23.2* 22.1*   Platelet Count 365* 386* 338     Recent Labs   Lab 07/29/22  0350 07/28/22  0537 07/27/22  0338   Sodium 142 143 138   Potassium 4.4 3.9 4.0   Chloride 103 103 99   CO2 25 25 23    BUN 59* 41* 61*   Creatinine 5.3* 3.9* 4.7*   Calcium 9.5 8.7 8.9   Phosphorus 3.3 2.7 3.2   Magnesium 2.1 1.9 2.0   Glucose 105* 81 182*   GFR 7.5* 10.8* 8.6*           Signed by: Alla German, MD, FASN

## 2022-07-29 NOTE — Plan of Care (Signed)
Problem: Renal Instability  Goal: Fluid and electrolyte balance are achieved/maintained  Outcome: Progressing  Flowsheets (Taken 07/29/2022 1100)  Fluid and electrolyte balance are achieved/maintained:   Follow fluid restrictions/IV/PO parameters   Monitor/assess lab values and report abnormal values   Assess and reassess fluid and electrolyte status   Observe for cardiac arrhythmias     Problem: Patient Receiving Advanced Renal Therapies  Goal: Therapy access site remains intact  Outcome: Progressing  Flowsheets (Taken 07/29/2022 1100)  Therapy access site remains intact:   Assess therapy access site   Change therapy access site dressing as needed

## 2022-07-29 NOTE — Plan of Care (Addendum)
Day: 31      SHIFT EVENTS     Shift Narrative/Significant Events (PRN med administration, fall, RRT, etc.):   Patient is alert and oriented to self. On room air, scheduled mediation administered, pt tolerated well. On Heparin drip infusion, titrated per PTT result. Repositioned q2 hrs, and assisted with ADL's.  Safety and fall precautions remain in place. Purposeful rounding completed.        Problem: Moderate/High Fall Risk Score >5  Goal: Patient will remain free of falls  Flowsheets (Taken 07/29/2022 0019)  High (Greater than 13):   HIGH-Bed alarm on at all times while patient in bed   HIGH-Visual cue at entrance to patient's room   HIGH-Utilize chair pad alarm for patient while in the chair   HIGH-Apply yellow "Fall Risk" arm band     Problem: Compromised Sensory Perception  Goal: Sensory Perception Interventions  Flowsheets (Taken 07/28/2022 2320)  Sensory Perception Interventions: Offload heels, Pad bony prominences, Reposition q 2hrs/turn Clock, Q2 hour skin assessment under devices if present     Problem: Compromised Moisture  Goal: Moisture level Interventions  Flowsheets (Taken 07/28/2022 2320)  Moisture level Interventions: Moisture wicking products, Moisture barrier cream     Problem: Compromised Nutrition  Goal: Nutrition Interventions  Flowsheets (Taken 07/28/2022 2320)  Nutrition Interventions: Discuss nutrition at Rounds, I&Os, Document % meal eaten, Daily weights     Problem: Compromised Friction/Shear  Goal: Friction and Shear Interventions  Flowsheets (Taken 07/28/2022 2320)  Friction and Shear Interventions: Pad bony prominences, Off load heels, HOB 30 degrees or less unless contraindicated, Consider: TAP seated positioning, Heel foams     Problem: Infection Prevention  Goal: Free from infection  Flowsheets (Taken 07/29/2022 0019)  Free from infection:   Monitor/assess vital signs   Assess incision for evidence of healing   Assess for signs and symptoms of infection     Problem: Impaired  Mobility  Goal: Mobility/Activity is maintained at optimal level for patient  Flowsheets (Taken 07/29/2022 0019)  Mobility/activity is maintained at optimal level for patient: Encourage independent activity per ability     Problem: Constipation  Goal: Fluid and electrolyte balance are achieved/maintained  Flowsheets (Taken 07/29/2022 0019)  Fluid and electrolyte balance are achieved/maintained:   Monitor/assess lab values and report abnormal values   Assess and reassess fluid and electrolyte status     Problem: Safety  Goal: Patient will be free from injury during hospitalization  Flowsheets (Taken 07/29/2022 0019)  Patient will be free from injury during hospitalization:   Assess patient's risk for falls and implement fall prevention plan of care per policy   Provide and maintain safe environment   Use appropriate transfer methods   Ensure appropriate safety devices are available at the bedside   Include patient/ family/ care giver in decisions related to safety   Hourly rounding  Goal: Patient will be free from infection during hospitalization  Flowsheets (Taken 07/29/2022 0019)  Free from Infection during hospitalization:   Assess and monitor for signs and symptoms of infection   Monitor all insertion sites (i.e. indwelling lines, tubes, urinary catheters, and drains)   Encourage patient and family to use good hand hygiene technique     Problem: Hemodynamic Status: Cardiac  Goal: Stable vital signs and fluid balance  Flowsheets (Taken 07/29/2022 0019)  Stable vital signs and fluid balance:   Assess signs and symptoms associated with cardiac rhythm changes   Monitor lab values     Problem: Renal Instability  Goal: Fluid and electrolyte  balance are achieved/maintained  Flowsheets (Taken 07/29/2022 0019)  Fluid and electrolyte balance are achieved/maintained:   Monitor/assess lab values and report abnormal values   Observe for cardiac arrhythmias     Problem: Every Day - Stroke  Goal: Mobility/Activity is maintained at  optimal level for patient  Flowsheets (Taken 07/29/2022 0019)  Mobility/activity is maintained at optimal level for patient: Encourage independent activity per ability  Goal: Skin integrity is maintained or improved  Flowsheets (Taken 07/29/2022 0019)  Skin integrity is maintained or improved:   Assess Braden Scale every shift   Turn or reposition patient every 2 hours or as needed unless able to reposition self   Relieve pressure to bony prominences   Keep skin clean and dry   Increase activity as tolerated/progressive mobility     Problem: Fluid and Electrolyte Imbalance/ Endocrine  Goal: Fluid and electrolyte balance are achieved/maintained  Flowsheets (Taken 07/29/2022 0019)  Fluid and electrolyte balance are achieved/maintained:   Monitor/assess lab values and report abnormal values   Assess and reassess fluid and electrolyte status     NURSING SHIFT NOTE         ASSESSMENT     Changes in assessment from patient's baseline this shift:    Neuro:   CV:   Pulm:   Peripheral Vascular:   HEENT:   GI:   BM during shift: , Last BM: Last BM Date: 07/26/22  GU:    Integ:   MS:     Pain:   Pain Interventions:   Medications Utilized:     Mobility: PMP Activity: Step 1 - Bedrest of Distance Walked (ft) (Step 6,7): 0 Feet           Lines     Patient Lines/Drains/Airways Status       Active Lines, Drains and Airways       Name Placement date Placement time Site Days    Permacath Catheter - Tunneled 07/14/22 Subclavian vein catheter Right 07/14/22  1404  -- 14    Peripheral IV 07/25/22 20 G Right Antecubital 07/25/22  1700  Antecubital  3    Peripheral IV 07/25/22 22 G Anterior;Left Forearm 07/25/22  1700  Forearm  3    Midline IV 07/01/22 Anterior;Left Upper Arm 07/01/22  1315  Upper Arm  27                         VITAL SIGNS

## 2022-07-29 NOTE — Progress Notes (Signed)
[facility]  PROGRESS NOTE      Patient: Shelby Barron  Date: 07/29/2022   LOS: 31 Days  Admission Date: 06/28/2022   MRN: [id number]  Attending: Melina Fiddler, MD  When on service as the attending, please contact me on Epic Secure Chat from 7 AM - 7 PM for non-urgent issues. For urgent matters use XTend page from 7 AM - 7 PM.       ASSESSMENT/PLAN     Shelby Barron is a 82 y.o. female admitted with Bladder cancer    Toxic metabolic Encephalopathy/delirium resolving  - Patient was upgraded to ICU on 6/30 for lethargy and decreased responsiveness  - CT head was repeated on 6/29, no acute finding  - ABG consistent with respiratory alkalosis  - EEG consistent with encephalopathy no seizure, MRI no acute stroke  - CT abdomen with and without contrast after premedication did not show any leak or abscess  - SLP advanced diet to pureed.   -NG tube removed 7/9  -SLP following daily  - Palliative consulted:  Family is clear they would like no limits to care and patient should remain full code.     Anemia, 2/2 CKD and blood loss during OR  - TSAT 18%  - continue Aranesp per nephrology  - Trend CBC, transfuse Hgb<7. Status post 1U pRBC for Hgb 6.1 on 7/7 with improvement   -H&H has been stable around 8.5  No active signs of bleeding    s/p open cystectomy, bilateral nephrectomies, and resection of small bowel after intra-op enterotomy    Mixed shock - hemorrhagic vs septic: Resolved  Leukocytosis  ?  Necrotic wound tissue  -I reviewed the images from last wound care note on July 8 which showed some necrotic tissue    -I discussed with general surgery who agreed the patient needs debridement but needs to hold Eliquis for 2 days prior  Will hold Eliquis starting now and switch to heparin drip   As per my discussion with general surgery today plan for bedside debridement tomorrow.  Will hold heparin in the morning  I discussed with ID and plan for short course of antibiotic with debridement.  - 6/10 small bowel  enteretomy with 1L EBL. Went to the OR on June 12 for small bowel anastomosis, closure of mesenteric defect, closure of abdominal wall with wound VAC application. Wound VAC last changed on 7/11  - Surgery consulted, recommend CTAP with PO contrast that showed no anastomotic leak. Low concern for abscesses as they'd expect these to have a more loculated appearance in the interim between 6/17 and 7/1 imaging  - ID consulted, completed meropenem and Mycamine for 2 weeks on 7/3.    -Discussed with ID and they recommend to monitor off antibiotic for now      Acute CVA on the right frontal lobe most likely from hypotension and shock  History of CVA  Petechial hemorrhage vs cortical laminar necrosis on CTH  - Left upper extremity not moving at all unclear duration  - Neurology following, initially started on ASA 81mg  only due to concern for risk of hemorrhagic conversion. Now OK to switch to Eliquis for Afib and discontinue aspirin per neurology on 7/5 as > 10d post event.  - SBP goal 120-160  - Carotid Doppler showed plaque with no critical stenosis  - Head CT on 7/7 w/ a few areas of cortical hyperdensity - petechial hemorrhage vs cortical laminar necrosis. Discussed with neuro OK to continue  eliquis for now. Plan for repeat CTH today. Neuro exam stable.  - EEG encephalopathic pattern no seizure, repeat MRI no new infarction  - PT OT recommend SNF  -Repeat CT head on 07/26/2022 stable     Acute hypoxemic respiratory failure resolved  COPD  Extubated on 6/13, currently on room air  Maintain SaO2 > 92%  Continue as needed neb treatment  X-ray repeated on 6/29 showed resolution of interstitial edema     ESRD  S/p CRRT this admission, now on HD TTS  Continue dialysis per nephrology  IR removed the nonfunctional permacath and right Quinton IJ cath and replaced with a new permacath on 6/26.     HTN  On metoprolol   PRN IV labetalol for SBP > 180      New onset Afib with RVR, improved  - CHA2DS2-VASc score is 7  - Status post  amiodarone drip. Now stable on oral amiodarone and coreg per cardiology recommendations  - GI and general surgery cleared for anticoagulation. Neurology recommended to hold anticoagulation initiation for 10 days given the size of stroke.   -  started on Eliquis since 07/23/22  Hold Eliquis starting today 7/10 for 2 days prior to wound debridement     Renal cell carcinoma outpatient follow-up hematology oncology  Urothelial carcinoma  Thrombocytopenia  - VTE ppx: SCDs, eliquis     Diabetes Mellitus type 2  -Sliding scale insulin                        Nutrition: Pured diet as tolerated  Malnutrition Documentation    Moderate Malnutrition related to inadequate nutritional intake in the setting of acute illness as evidenced by <50% EER > 5 days, mild muscle losses (temporalis, deltoid) - new                 Patient has BMI=Body mass index is 39.27 kg/m.  Diagnosis: Obesity based on BMI criteria             Recent Labs   Lab 07/29/22  0350 07/28/22  1710 07/28/22  0537   Hemoglobin 7.4* 7.7* 7.2*   Hematocrit 22.7* 23.2* 22.1*   MCV 91.9 90.6 91.7   WBC 10.85* 10.43* 10.31*   Platelet Count 365* 386* 338     Recent Labs   Lab 07/18/22  0421 06/02/22  0514   Ferritin 2,300.21*  --    Iron 18* 62   TIBC 99* 145*   Iron Saturation 18 43     Anemia Diagnosis: Already documented in note    Code Status: Full Code    Dispo: SNF    Family Contact: Daughter [name]     DVT Prophylaxis:   Current Facility-Administered Medications (Includes Only Anticoagulants, Misc. Hematological)   Medication Dose Route Last Admin    albumin human 25 % bag 25 g  100 mL Intravenous      [Held by provider] apixaban (ELIQUIS) tablet 2.5 mg  2.5 mg Oral 2.5 mg at 07/28/22 0957    heparin 25,000 units in dextrose 5% 500 mL infusion (premix)  11 Units/kg/hr Intravenous 9 Units/kg/hr at 07/29/22 0521          CHART  REVIEW & DISCUSSION     The following chart items were reviewed as of 2:44 PM on 07/29/22:  [x]  Lab Results [x]  Imaging Results   [x]   Problem List  [x]  Current Orders [x]  Current Medications  []  Allergies  [x]  Code Status [x]   Previous Notes   []  SDoH    The management and plan of care for this patient was discussed with the following specialty consultants:  []  Cardiology  []  Gastroenterology                 [x]  Infectious Disease  []  Pulmonology []  Neurology                [x]  Nephrology  []  Neurosurgery []  Orthopedic Surgery  []  Heme/Onc  [x]  General Surgery []  Psychiatry                                   []  Palliative    SUBJECTIVE     [name] reports she has abdominal pain no fever no chills no shortness of breath no chest pain no cough no headache no dizziness  MEDICATIONS     Current Facility-Administered Medications   Medication Dose Route Frequency    acetaminophen  650 mg Oral 4 times per day    Or    acetaminophen  650 mg Oral 4 times per day    amiodarone  200 mg per NG tube Daily    [Held by provider] apixaban  2.5 mg Oral Q12H SCH    carvedilol  12.5 mg Oral Q12H SCH    darbepoetin alfa  0.45 mcg/kg Subcutaneous Weekly    glycopyrrolate  0.2 mg Intravenous Q8H    [START ON 07/30/2022] insulin glargine  5 Units Subcutaneous QAM    insulin lispro  1-8 Units Subcutaneous Q6H    pantoprazole  40 mg per NG tube Q12H SCH    prenatal vitamin  1 tablet Oral Daily    thiamine  100 mg per NG tube Daily       PHYSICAL EXAM     Vitals:    07/29/22 1407   BP: 111/53   Pulse: 77   Resp: 18   Temp:    SpO2: 99%       Temperature: Temp  Min: 97.9 F (36.6 C)  Max: 99.5 F (37.5 C)  Pulse: Pulse  Min: 77  Max: 92  Respiratory: Resp  Min: 17  Max: 22  Non-Invasive BP: BP  Min: 102/55  Max: 171/78  Pulse Oximetry SpO2  Min: 93 %  Max: 99 %    Intake and Output Summary (Last 24 hours) at Date Time    Intake/Output Summary (Last 24 hours) at 07/29/2022 1444  Last data filed at 07/29/2022 1407  Gross per 24 hour   Intake 110 ml   Output 2251 ml   Net -2141 ml         GEN APPEARANCE: Alert, awake and oriented x 1  HEENT: PERLA; EOMI; Conjunctiva  Clear  NECK: Supple; No bruits  CVS: RRR, S1, S2; No M/G/R  LUNGS: CTAB; , no Rhonchi: No rales  ABD: Soft; No TTP; + Normoactive BS vertical abdominal wound and wound VAC in place  EXT: No edema; Pulses 2+ and intact  NEURO: moving right upper extremity spontaneously, able to move lower extremities for pain but minimal, no movement at all on the left upper extremity      LABS     Recent Labs   Lab 07/29/22  0350 07/28/22  1710 07/28/22  0537   WBC 10.85* 10.43* 10.31*   RBC 2.47* 2.56* 2.41*   Hemoglobin 7.4* 7.7* 7.2*   Hematocrit 22.7* 23.2* 22.1*   MCV  91.9 90.6 91.7   Platelet Count 365* 386* 338       Recent Labs   Lab 07/29/22  0350 07/28/22  0537 07/27/22  0338 07/26/22  0558 07/25/22  0351   Sodium 142 143 138 138 137   Potassium 4.4 3.9 4.0 4.0 3.7   Chloride 103 103 99 99 100   CO2 25 25 23 23 23    BUN 59* 41* 61* 45* 29*   Creatinine 5.3* 3.9* 4.7* 3.8* 2.5*   Glucose 105* 81 182* 170* 163*   Calcium 9.5 8.7 8.9 8.7 8.0   Magnesium 2.1 1.9 2.0 2.0 1.8                   Recent Labs   Lab 07/29/22  1200 07/29/22  0350 07/28/22  1713   PTT 50* 78* 31       Microbiology Results (last 15 days)       Procedure Component Value Units Date/Time    Culture, Blood, Aerobic And Anaerobic [[id number]] Collected: 07/18/22 1400    Order Status: Completed Specimen: Blood, Venous Updated: 07/23/22 2200     Culture Blood No growth at 5 days    Culture, Blood, Aerobic And Anaerobic [[id number]] Collected: 07/18/22 1400    Order Status: Completed Specimen: Blood, Venous Updated: 07/23/22 2200     Culture Blood No growth at 5 days             RADIOLOGY     CT Head WO Contrast    Result Date: 07/26/2022   1.No significant change in multifocal areas of subacute infarction within bilateral cerebral hemispheres. 2.Small areas of petechial hemorrhage in the left parietal and occipital lobes are unchanged. No new hemorrhage or measurable parenchymal hematoma. 3.Persistent complete opacification of the right mastoid air cells and  middle ear cavity. Garnette Gunner, MD 07/26/2022 12:27 PM    CT Head WO Contrast    Result Date: 07/25/2022   1.Redemonstration of multifocal areas of bilateral acute infarctions. A few areas of cortical hyperdensity likely represent petechial hemorrhage. No mass occupying hematoma. No significant mass effect or midline shift. 2.Redemonstrated right mastoid and right middle ear cavity effusion with areas of bony thinning versus dehiscence of the right tegmen tympani and right tegmen mastoideum. Vara Guardian, MD 07/25/2022 2:19 PM    CT Abdomen Pelvis W IV And PO Cont    Result Date: 07/19/2022   1.Slight interval increase in left-sided retroperitoneal fluid collection with the other abdominal fluid collections appearing relatively stable. 2. There is no definite abscess or acute inflammatory process. 3. Incidental note is made of a small paraesophageal type hernia. 4. Sigmoid diverticulosis is noted without CT evidence of diverticulitis. 5. Other findings as noted above. Miguel Dibble, MD 07/19/2022 6:36 AM    MRI Brain WO Contrast    Result Date: 07/18/2022   Interval evolution of multifocal recent infarcts in the bilateral cerebral hemispheres. No new infarct. Milan Waunita Schooner, MD 07/18/2022 5:57 PM    XR Chest AP Portable    Result Date: 07/17/2022  1.Near-complete resolution of interstitial edema. 2.Improving pulmonary vascular congestion. Legrand Pitts, MD 07/17/2022 2:38 PM    CT Head WO Contrast    Result Date: 07/17/2022   1.No new acute intracranial pathology. 2.There are multiple regions of cytotoxic edema in the right > left frontal and left > right occipital lobes, consistent with known early subacute infarcts. Dannielle Burn 07/17/2022 8:26 AM    US Carotid Duplex Dopp Comp Bilateral  Result Date: 07/15/2022   1. Plaque formation in the right internal carotid artery bulb with elevated velocities. Stenosis is estimated at 50-69%. 2. Plaque in the left internal carotid artery with normal velocities. Any stenosis is less  than 50%. Criteria for stenosis and velocity parameters are based on the guidelines of the SRU (Society of Radiologists in Ultrasound). Grant et al: Carotid Artery Stenosis: Grayscale and Doppler Ultrasound Diagnosis-- Society of Radiologists in Ultrasound Consensus Conference Ultrasound Quarterly. Volume 19, Number 4; December, 2003. Recommendations updated October 2021 Verne Carrow is an [facility] accredited facility Suszanne Finch, MD 07/15/2022 9:41 AM    Tunneled Cath Removal (Permcath)    Result Date: 07/14/2022  1. Nonfunctioning tunneled dialysis catheter as described above. 2. Successful placement of a new 19 cm Equistream catheter as described above. Dimitrios Papadouris, MD 07/14/2022 4:06 PM    Tunneled Cath Placement (Permcath)    Result Date: 07/14/2022  1. Nonfunctioning tunneled dialysis catheter as described above. 2. Successful placement of a new 19 cm Equistream catheter as described above. Dimitrios Papadouris, MD 07/14/2022 4:06 PM    MRI Brain WO Contrast    Result Date: 07/13/2022   1.Patchy areas of acute/recent infarction involving the cerebral hemispheres bilaterally. The infarcts have a configuration suggesting watershed territory infarcts. 2.Chronic ischemic changes as discussed. Georgann Housekeeper, MD 07/13/2022 10:28 PM    CT Head WO Contrast    Result Date: 07/13/2022   1.New region of loss of gray-white matter differentiation in the right frontal lobe, concerning for acute infarction. 2.A few additional areas of poor gray-white matter differentiation for example in the left occipital lobe may be artifactual but could be correlated with MRI. 3.No evidence of acute intracranial hemorrhage or significant mass effect. These urgent results were discussed with and acknowledged by Dr. Don Broach on 07/13/2022 3:20 PM. Vara Guardian, MD 07/13/2022 3:21 PM    XR Chest AP Portable    Result Date: 07/10/2022  Interstitial markings have slightly increased, suggesting minimal edema. No other change. Wilmon Pali, MD 07/10/2022  4:49 PM    XR Chest AP Portable    Result Date: 07/07/2022   There is mild interstitial prominence suggesting mild congestive heart failure/fluid overload in the setting of shortness of breath [name], MD 07/07/2022 12:32 PM    XR Chest AP Portable    Result Date: 07/06/2022   Hypoventilation with basilar atelectasis. Charlott Rakes, MD 07/06/2022 3:35 PM    CT Abdomen Pelvis W IV And PO Cont    Result Date: 07/06/2022  1.Postoperative changes from bilateral nephrectomy and cystectomy. 2.Small amount of ascites including some areas which appear loculated. Small collections of fluid and gas in the anterior midline abdomen may be postoperative but superimposed infection is not excluded. No rim-enhancing fluid collections or extravasation of oral contrast to suggest anastomotic leak. Demetrios Isaacs, MD 07/06/2022 5:51 AM    CT Head WO Contrast    Result Date: 07/06/2022   1.No acute intracranial abnormality is seen. Leandro Reasoner, MD 07/06/2022 3:56 AM    XR Abdomen AP    Result Date: 07/05/2022  Enteric tube terminates in the distal stomach. Elease Etienne, DO 07/05/2022 12:24 PM    XR Chest AP Portable    Result Date: 06/30/2022  1. Lines and tubes in adequate position. No pneumothorax. 2. Trace left pleural effusion and bibasilar atelectasis. Jasmine December D'Heureux, MD 06/30/2022 4:53 PM    XR Chest AP Portable    Result Date: 06/30/2022  Catheters as described. Pankaj Dominica, MD 06/30/2022  2:07 PM   Echo Results       None          Results for orders placed or performed during the hospital encounter of 06/28/22   MRI Brain WO Contrast    Narrative    HISTORY: Declining mental status, right gaze deviation.     COMPARISON: CT head 07/17/2022 and MRI brain 07/13/2022.     TECHNIQUE: MRI of the brain performed on a 1.5 Tesla scanner without  intravenous contrast.    CONTRAST: None.    FINDINGS:     Interval evolution of multifocal recent infarcts with associated edema in  the bilateral cerebral hemispheres. No new infarct. Mild chronic  small  vessel ischemic changes within the cerebral white matter. Mild generalized  cerebral volume loss.     No acute hemorrhage, extra-axial fluid collection, or midline shift. No  mass lesion or vasogenic edema.     Normal appearance of the sella. Cerebellar tonsils are appropriately  positioned. No acute hydrocephalus. Patent basal cisterns. Skull base flow  voids are intact.    No osseous abnormality. No acute orbital abnormality. Status post bilateral  lens replacement. No significant paranasal sinus mucosal disease. Severe  opacification of the right mastoid air cells and middle ear cavity. Mild  opacification of left mastoid air cells.       Impression         Interval evolution of multifocal recent infarcts in the bilateral cerebral  hemispheres. No new infarct.    Milan Waunita Schooner, MD  07/18/2022 5:57 PM   CT Head WO Contrast    Narrative    HISTORY: Petechial hemorrhage follow up.     COMPARISON: Multiple priors most recent CT 07/25/2022    TECHNIQUE: CT of the head performed without intravenous contrast. The  following dose reduction techniques were utilized: automated exposure  control and/or adjustment of the mA and/or KV according to patient size,  and the use of an iterative reconstruction technique.    CONTRAST: None.    FINDINGS:  Multifocal areas of subacute infarction within bilateral cerebral  hemispheres remain similar to prior. Small areas of petechial hemorrhage in  the left parietal and occipital lobes are unchanged. No new hemorrhage or  measurable parenchymal hematoma.    There is a background moderate chronic microangiopathic changes in the  periventricular white matter. Moderate generalized volume loss. No  extra-axial fluid collection or midline shift. Ventricles and basal  cisterns are patent.    Persistent complete opacification of the right mastoid air cells and middle  ear cavity. Paranasal sinuses are clear.      Impression       1.No significant change in multifocal areas of  subacute infarction within  bilateral cerebral hemispheres.  2.Small areas of petechial hemorrhage in the left parietal and occipital  lobes are unchanged. No new hemorrhage or measurable parenchymal hematoma.  3.Persistent complete opacification of the right mastoid air cells and  middle ear cavity.    Garnette Gunner, MD  07/26/2022 12:27 PM       Signed,  Melina Fiddler, MD  2:44 PM 07/29/2022

## 2022-07-29 NOTE — SLP Progress Note (Signed)
[facility]  Speech Therapy Treatment Note    Patient:  Shelby Barron MRN#:  [id number]  Unit:  [facility] [address] [facility] Room/Bed:  V4098/J1914.A      Time of treatment:   SLP Received On: 07/29/22  Start Time: 0740  Stop Time: 0800  Time Calculation (min): 20 min       Personal Protective Equipment (PPE)  Gloves and Procedure Mask with Shield    Subjective:   Pt is alert/ verbal , confused         Objective: Oropharyngeal analysis during PO trials in order to determine LRD,development and training of swallow strategies in order to improve swallow function and decrease risk of penetration/aspiration;  therapeutic feedings by SLP  and safe swallowing precautions education.       Assessment: pt presents with mild oral pharyngeal dysphagia characterized by delay in bolus transit , delay in pharyngeal swallow trigger and fairly adequate HLE upon subjective digital palpation. Pt tolerated 50% of breakfast and 4 fl oz of Ensure via straw without s/s overt aspiration or penetration            Plan/Recommendations:  IDDSI Pureed - PU4  and Mildly thick  MT2  Slow feeding rate, small bites / sips , upright position during and after the meals , Alternate solid and liquid , full assistance with meals , liquid wash, and check oral cavity for residue         Charges Minutes   SLP treat    Swallow treat 20       07/29/2022  Presley Raddle MS [facility] SLP   07/29/2022 2:50 PM  [phone]

## 2022-07-29 NOTE — Plan of Care (Addendum)
NURSING SHIFT NOTE     Patient: Shelby Barron  Day: 31      SHIFT EVENTS     Shift Narrative/Significant Events (PRN med administration, fall, RRT, etc.):     Patient alert and oriented to self, on room air. Tolerate schedule meds well. Bs check no coverage given. Dialysis completed during shift. Heparin drip in progress, ptt done during shift with no changes in heparin rate/dose. No evidence of bleeding noted. Wound care done by wound nurse, wound vac clean, dry and intact with scant amount of drainage noted. Four eye skin assessment done with CT Naomi. Skin and incontinent care provided. Appetite fair, but continue to encourage patient to eat and reinforce for her to swallow .Patient and daughter updated on the current plan of care, plan of care in progress.     Safety and fall precautions remain in place. Purposeful rounding completed.          ASSESSMENT     Changes in assessment from patient's baseline this shift:    Neuro: No  CV: No  Pulm: No  Peripheral Vascular: No  HEENT: No  GI: No  BM during shift: No, Last BM: Last BM Date: 07/29/22  GU: No   Integ: No  MS: No    Pain: None  Pain Interventions:   Medications Utilized:     Mobility: PMP Activity: Step 4 - Dangle at Bedside of Distance Walked (ft) (Step 6,7): 0 Feet           Lines     Patient Lines/Drains/Airways Status       Active Lines, Drains and Airways       Name Placement date Placement time Site Days    Permacath Catheter - Tunneled 07/14/22 Subclavian vein catheter Right 07/14/22  1404  -- 15    Peripheral IV 07/25/22 20 G Right Antecubital 07/25/22  1700  Antecubital  4    Peripheral IV 07/25/22 22 G Anterior;Left Forearm 07/25/22  1700  Forearm  4    Midline IV 07/01/22 Anterior;Left Upper Arm 07/01/22  1315  Upper Arm  28                         VITAL SIGNS     Vitals:    07/29/22 1543   BP: 120/67   Pulse: 80   Resp: 18   Temp: 97.7 F (36.5 C)   SpO2: 100%       Temp  Min: 97.7 F (36.5 C)  Max: 99.5 F (37.5 C)  Pulse  Min: 77  Max:  92  Resp  Min: 17  Max: 19  BP  Min: 102/55  Max: 171/78  SpO2  Min: 93 %  Max: 100 %      Intake/Output Summary (Last 24 hours) at 07/29/2022 1800  Last data filed at 07/29/2022 1407  Gross per 24 hour   Intake 110 ml   Output 2251 ml   Net -2141 ml                  CARE PLAN       Problem: Moderate/High Fall Risk Score >5  Goal: Patient will remain free of falls  Outcome: Progressing  Flowsheets (Taken 07/29/2022 1755)  VH High Risk (Greater than 13):   RED "HIGH FALL RISK" SIGNAGE   BED ALARM WILL BE ACTIVATED WHEN THE PATEINT IS IN BED WITH SIGNAGE "RESET BED ALARM"   A CHAIR PAD ALARM  WILL BE USED WHEN PATIENT IS UP SITTING IN A CHAIR   A safety companion may be used when deemed appropriate by the Primary RN and Clinical Administrator   PATIENT IS TO BE SUPERVISED FOR ALL TOILETING ACTIVITIES   Keep door open for better visibility   Include family/significant other in multidisciplinary discussion regarding plan of care as appropriate   Request PT/OT therapy consult order from physician for patients with gait/mobility impairment   Use assistive devices   Use chair-pad alarm device   Use of floor mat   Use of roll guard     Problem: Compromised Sensory Perception  Goal: Sensory Perception Interventions  Outcome: Progressing  Flowsheets (Taken 07/29/2022 1755)  Sensory Perception Interventions: Offload heels, Pad bony prominences, Reposition q 2hrs/turn Clock, Q2 hour skin assessment under devices if present     Problem: Compromised Moisture  Goal: Moisture level Interventions  Outcome: Progressing  Flowsheets (Taken 07/29/2022 1755)  Moisture level Interventions: Moisture wicking products, Moisture barrier cream     Problem: Compromised Activity/Mobility  Goal: Activity/Mobility Interventions  Outcome: Progressing  Flowsheets (Taken 07/29/2022 1755)  Activity/Mobility Interventions: Pad bony prominences, TAP Seated positioning system when OOB, Promote PMP, Reposition q 2 hrs / turn clock, Offload heels     Problem:  Compromised Nutrition  Goal: Nutrition Interventions  Outcome: Progressing  Flowsheets (Taken 07/29/2022 1755)  Nutrition Interventions: Discuss nutrition at Rounds, I&Os, Document % meal eaten, Daily weights     Problem: Compromised Friction/Shear  Goal: Friction and Shear Interventions  Outcome: Progressing  Flowsheets (Taken 07/29/2022 1755)  Friction and Shear Interventions: Pad bony prominences, Off load heels, HOB 30 degrees or less unless contraindicated, Consider: TAP seated positioning, Heel foams     Problem: Inadequate Airway Clearance  Goal: Patent Airway maintained  Outcome: Progressing  Flowsheets (Taken 07/29/2022 1755)  Patent airway maintained:   Position patient for maximum ventilatory efficiency   Provide adequate fluid intake to liquefy secretions   Suction secretions as needed   Reinforce use of ordered respiratory interventions (i.e. CPAP, BiPAP, Incentive Spirometer, Acapella, etc.)   Reposition patient every 2 hours and as needed unless able to self-reposition     Problem: Impaired Mobility  Goal: Mobility/Activity is maintained at optimal level for patient  Outcome: Progressing  Flowsheets (Taken 07/29/2022 1755)  Mobility/activity is maintained at optimal level for patient:   Consult/collaborate with Physical Therapy and/or Occupational Therapy   Encourage independent activity per ability     Problem: Constipation  Goal: Fluid and electrolyte balance are achieved/maintained  Outcome: Progressing  Flowsheets (Taken 07/29/2022 1755)  Fluid and electrolyte balance are achieved/maintained:   Monitor/assess lab values and report abnormal values   Assess and reassess fluid and electrolyte status   Monitor for muscle weakness   Observe for cardiac arrhythmias  Goal: Elimination patterns are normal or improving  Outcome: Progressing  Flowsheets (Taken 07/29/2022 1755)  Elimination patterns are normal or improving: Assess for and discuss C. diff screening with LIP     Problem: Safety  Goal: Patient will  be free from injury during hospitalization  Outcome: Progressing  Flowsheets (Taken 07/29/2022 1755)  Patient will be free from injury during hospitalization:   Assess patient's risk for falls and implement fall prevention plan of care per policy   Provide and maintain safe environment   Use appropriate transfer methods   Ensure appropriate safety devices are available at the bedside   Include patient/ family/ care giver in decisions related to safety   Assess for  patients risk for elopement and implement Elopement Risk Plan per policy   Provide alternative method of communication if needed (communication boards, writing)   Hourly rounding  Goal: Patient will be free from infection during hospitalization  Outcome: Progressing  Flowsheets (Taken 07/29/2022 1755)  Free from Infection during hospitalization:   Assess and monitor for signs and symptoms of infection   Monitor all insertion sites (i.e. indwelling lines, tubes, urinary catheters, and drains)   Encourage patient and family to use good hand hygiene technique   Monitor lab/diagnostic results     Problem: Pain  Goal: Pain at adequate level as identified by patient  Outcome: Progressing  Flowsheets (Taken 07/29/2022 1755)  Pain at adequate level as identified by patient:   Identify patient comfort function goal   Assess for risk of opioid induced respiratory depression, including snoring/sleep apnea. Alert healthcare team of risk factors identified.   Assess pain on admission, during daily assessment and/or before any "as needed" intervention(s)   Reassess pain within 30-60 minutes of any procedure/intervention, per Pain Assessment, Intervention, Reassessment (AIR) Cycle   Evaluate if patient comfort function goal is met   Evaluate patient's satisfaction with pain management progress   Offer non-pharmacological pain management interventions   Consult/collaborate with Pain Service   Consult/collaborate with Physical Therapy, Occupational Therapy, and/or Speech  Therapy   Include patient/patient care companion in decisions related to pain management as needed     Problem: Side Effects from Pain Analgesia  Goal: Patient will experience minimal side effects of analgesic therapy  Outcome: Progressing  Flowsheets (Taken 07/29/2022 1755)  Patient will experience minimal side effects of analgesic therapy:   Monitor/assess patient's respiratory status (RR depth, effort, breath sounds)   Assess for changes in cognitive function   Prevent/manage side effects per LIP orders (i.e. nausea, vomiting, pruritus, constipation, urinary retention, etc.)   Evaluate for opioid-induced sedation with appropriate assessment tool (i.e. POSS)     Problem: Hemodynamic Status: Cardiac  Goal: Stable vital signs and fluid balance  Outcome: Progressing  Flowsheets (Taken 07/29/2022 1755)  Stable vital signs and fluid balance:   Assess signs and symptoms associated with cardiac rhythm changes   Monitor lab values     Problem: Renal Instability  Goal: Fluid and electrolyte balance are achieved/maintained  Outcome: Progressing  Flowsheets (Taken 07/29/2022 1755)  Fluid and electrolyte balance are achieved/maintained:   Monitor/assess lab values and report abnormal values   Monitor for muscle weakness   Assess and reassess fluid and electrolyte status   Follow fluid restrictions/IV/PO parameters   Observe for cardiac arrhythmias     Problem: Fluid and Electrolyte Imbalance/ Endocrine  Goal: Fluid and electrolyte balance are achieved/maintained  Outcome: Progressing  Flowsheets (Taken 07/29/2022 1755)  Fluid and electrolyte balance are achieved/maintained:   Monitor/assess lab values and report abnormal values   Assess and reassess fluid and electrolyte status   Monitor for muscle weakness   Observe for cardiac arrhythmias  Goal: Adequate hydration  Outcome: Progressing  Flowsheets (Taken 07/29/2022 1755)  Adequate hydration:   Assess mucus membranes, skin color, turgor, perfusion and presence of edema   Assess  for peripheral, sacral, periorbital and abdominal edema   Monitor and assess vital signs and perfusion     Problem: Diabetes: Glucose Imbalance  Goal: Blood glucose stable at established goal  Outcome: Progressing  Flowsheets (Taken 07/29/2022 1755)  Blood glucose stable at established goal:   Assess for hypoglycemia /hyperglycemia   Include patient/family in decisions related to nutrition/dietary selections  Follow fluid restrictions/IV/PO parameters   Monitor lab values   Monitor intake and output.  Notify LIP if urine output is < 30 mL/hour.   Monitor/assess vital signs   Coordinate medication administration with meals, as indicated   Ensure appropriate diet and assess tolerance   Ensure appropriate consults are obtained (Nutrition, Diabetes Education, and Case Management)   Ensure patient/family has adequate teaching materials   Ensure adequate hydration     Problem: Inadequate Gas Exchange  Goal: Patent Airway maintained  Outcome: Progressing  Flowsheets (Taken 07/29/2022 1755)  Patent airway maintained:   Position patient for maximum ventilatory efficiency   Provide adequate fluid intake to liquefy secretions   Suction secretions as needed   Reinforce use of ordered respiratory interventions (i.e. CPAP, BiPAP, Incentive Spirometer, Acapella, etc.)   Reposition patient every 2 hours and as needed unless able to self-reposition  Goal: Adequate oxygenation and improved ventilation  Outcome: Progressing  Flowsheets (Taken 07/29/2022 1755)  Adequate oxygenation and improved ventilation:   Assess lung sounds   Monitor SpO2 and treat as needed   Provide mechanical and oxygen support to facilitate gas exchange   Monitor and treat ETCO2   Teach/reinforce use of incentive spirometer 10 times per hour while awake, cough and deep breath as needed   Position for maximum ventilatory efficiency   Plan activities to conserve energy: plan rest periods   Consult/collaborate with Respiratory Therapy   Increase activity as  tolerated/progressive mobility     Problem: Neurological Deficit  Goal: Neurological status is stable or improving  Outcome: Progressing  Flowsheets (Taken 07/29/2022 1755)  Neurological status is stable or improving:   Monitor/assess/document neurological assessment (Stroke: every 4 hours)   Monitor/assess NIH Stroke Scale   Observe for seizure activity and initiate seizure precautions if indicated   Perform CAM Assessment   Re-assess NIH Stroke Scale for any change in status     Problem: Potential for Aspiration  Goal: Risk of aspiration will be minimized  Outcome: Progressing  Flowsheets (Taken 07/29/2022 1755)  Risk of aspiration will be minimized:   Assess/monitor ability to swallow using dysphagia screen: Keep patient NPO if patient fails screening   Monitor/assess for signs of aspiration (tachypnea, cough, wheezing, clearing throat, hoarseness after eating, decrease in SaO2)   Place swallow precaution signage above bed and supervise patient during oral intake   Consult/collaborate/follow recommended modified texture diet/thicken liquids as indicated by Speech Pathologist   Place patient up in chair to eat, if possible/head of bed up 90 degrees to eat if unable to be out of bed   Instruct patient to take small bites, small single sips of liquid, and do not use a straw     Problem: Every Day - Stroke  Goal: Mobility/Activity is maintained at optimal level for patient  Outcome: Progressing  Flowsheets (Taken 07/29/2022 1755)  Mobility/activity is maintained at optimal level for patient:   Consult/collaborate with Physical Therapy and/or Occupational Therapy   Encourage independent activity per ability  Goal: Elimination patterns are normal or improving  Outcome: Progressing  Flowsheets (Taken 07/29/2022 1755)  Elimination patterns are normal or improving: Assess for and discuss C. diff screening with LIP  Goal: Neurological status is stable or improving  Outcome: Progressing  Flowsheets (Taken 07/29/2022  1755)  Neurological status is stable or improving:   Monitor/assess/document neurological assessment (Stroke: every 4 hours)   Monitor/assess NIH Stroke Scale   Observe for seizure activity and initiate seizure precautions if indicated   Perform CAM Assessment  Re-assess NIH Stroke Scale for any change in status  Goal: Core/Quality measure requirements - Daily  Outcome: Progressing  Flowsheets (Taken 07/29/2022 1755)  Core/Quality measure requirements - Daily:   VTE Prevention: Ensure anticoagulant(s) administered and/or anti-embolism stockings/devices documented by end of day 2   Ensure antithrombotic administered or contraindication documented by LIP by end of day 2   Once lipid panel has resulted, check LDL. Contact provider for statin order if LDL > 70 (or ensure contraindication documented by LIP).   Continue stroke education (must include Modifiable Risk Factors, Warning Signs and Symptoms of Stroke, Activation of Emergency Medical System and Follow-up Appointments). Ensure handout has been given and documented.  Goal: Stable vital signs and fluid balance  Outcome: Progressing  Flowsheets (Taken 07/29/2022 1755)  Stable vital signs and fluid balance:   Position patient for maximum circulation/cardiac output   Monitor and assess vitals every 4 hours or as ordered and hemodynamic parameters   Monitor intake and output. Notify LIP if urine output is < 30 mL/hour.   Encourage oral fluid intake   Apply telemetry monitor as ordered  Goal: Patient will maintain adequate oxygenation  Outcome: Progressing  Flowsheets (Taken 07/29/2022 1755)  Patient will maintain adequate oxygenation:   Sleep Apnea: Assess if previously tested or on CPAP   Suction secretions as needed   Maintain SpO2 of greater than 92%   Sleep Apnea: Encourage patient to speak to PCP regarding sleep apnea/sleep study ([place] only)  Goal: Patient's risk of aspiration will be minimized  Outcome: Progressing  Flowsheets (Taken 07/29/2022 1755)  Patient's  risk of aspiration will be minimized:   Complete new dysphagia screen for any change in status: Keep patient NPO if patient fails   Place swallow precaution signage above bed   Monitor/assess for signs of aspiration (tachypnea, cough, wheezing, clearing throat, hoarseness after eating, decrease in SaO2   Assess and monitor ability to swallow   Thicken liquids as recommended by Speech Pathologist   Order modified texture diet as recommend by Speech Pathologist   Keep head of bed up a minimum of 30 degrees when hemodynamically stable   Place patient up in chair to eat, if possible   Instruct patient to take small bites   Do not use a straw   Supervise patient during oral intake   Consult/Collaborate with Speech Pathologist for dysphagia   HOB up 90 degrees to eat if unable to be OOB  Goal: Nutritional intake is adequate  Outcome: Progressing  Flowsheets (Taken 07/29/2022 1755)  Nutritional intake is adequate:   Consult/collaborate with Clinical Nutritionist   Consult/collaborate with Speech Therapy (swallow evaluations)  Goal: Skin integrity is maintained or improved  Outcome: Progressing  Flowsheets (Taken 07/29/2022 1755)  Skin integrity is maintained or improved:   Assess Braden Scale every shift   Turn or reposition patient every 2 hours or as needed unless able to reposition self   Increase activity as tolerated/progressive mobility   Avoid shearing   Keep skin clean and dry   Monitor patient's hygiene practices   Utilize specialty bed   Keep head of bed 30 degrees or less (unless contraindicated)   Collaborate with Wound, Ostomy, and Continence Nurse   Encourage use of lotion/moisturizer on skin   Relieve pressure to bony prominences  Goal: Neurovascular status is stable or improving  Outcome: Progressing  Flowsheets (Taken 07/29/2022 1755)  Neurovascular status is stable or improving:   Monitor/assess neurovascular status (pulses, capillary refill, pain, paresthesia, presence of edema)  Monitor/assess site of  invasive procedure for signs of bleeding   Monitor/assess for signs of Venous Thrombus Emboli (edema of calf/thigh redness, pain)  Goal: Effective coping demonstrated  Outcome: Progressing  Flowsheets (Taken 07/29/2022 1755)  Effective coping demonstrated:   Offer reassurance to decrease anxiety   Assess/report to LIP uncontrolled anxiety, depression, or ineffective coping  Goal: Will be able to express needs and understand communication  Outcome: Progressing  Flowsheets (Taken 07/29/2022 1755)  Able to express needs and understand communication:   Provide alternative method of communication if needed   Consult/collaborate with Speech Language Pathology (SLP)   Consult/collaborate with Case Management/Social Work   Include patient care companion in decisions related to communication   Patient/patient care companion demonstrates understanding on disease process, treatment plan, medications and discharge plan     Problem: Pain interferes with ability to perform ADL  Goal: Pain at adequate level as identified by patient  Outcome: Progressing  Flowsheets (Taken 07/29/2022 1755)  Pain at adequate level as identified by patient:   Identify patient comfort function goal   Assess for risk of opioid induced respiratory depression, including snoring/sleep apnea. Alert healthcare team of risk factors identified.   Assess pain on admission, during daily assessment and/or before any "as needed" intervention(s)   Reassess pain within 30-60 minutes of any procedure/intervention, per Pain Assessment, Intervention, Reassessment (AIR) Cycle   Evaluate if patient comfort function goal is met   Evaluate patient's satisfaction with pain management progress   Offer non-pharmacological pain management interventions   Consult/collaborate with Pain Service   Consult/collaborate with Physical Therapy, Occupational Therapy, and/or Speech Therapy   Include patient/patient care companion in decisions related to pain management as needed      Problem: Everyday - Heart Failure  Goal: Mobility/Activity is Maintained at Optimal Level for Patient  Outcome: Progressing  Flowsheets (Taken 07/29/2022 1755)  Mobility/Activity is Maintained at Optimal Level for Patient:   Perform active/passive ROM   Reposition patient every 2 hours and as needed unless able to reposition self   Increase mobility as tolerated/progressive mobility protocol   Maintain SCD's as Ordered   Consult with Physical Therapy and/or Occupational Therapy   Assess for changes in respiratory status, level of consciousness and/or development of fatigue  Goal: Nutritional Intake is Adequate  Outcome: Progressing  Flowsheets (Taken 07/29/2022 1755)  Nutritional Intake is Adequate:   Cardiac diet-2 gm Sodium   Consult/Collaborate with Nutritionist   Assess appetite,anorexia and amount of meal/food tolerated   Encourage/perform oral hygiene as appropriate   Patient and family teaching on low sodium diet   Fluid Restricction if needed

## 2022-07-29 NOTE — Progress Notes (Signed)
Nutritional Support Services  Nutrition Follow-up    Shelby Barron 82 y.o. female   MRN: [id number]    Summary of Nutrition Recommendations:  Continue Puree, mild thick diet per SLP recommendation  Nepro BID to help pt meet estimated needs. Each Nepro shake provides 425 kcal, 19 gm protein, 250 mg potassium and 170 mg phosphorus.  Monitor wt trends, PO intake, and lab values    -----------------------------------------------------------------------------------------------------------------                                                       ASSESSMENT DATA     Subjective Nutrition:   Pt in HD. A/ox1. Discussed pt with RN, states not drinking Glucerna regularly but did okay with breakfast today. NGT removed 7/9. Will adjust to renal ONS of Nepro BID to help pt meet needs. Pt with overall variable PO intake. If pt consistently consuming <50% of trays, may consider use of appetite stimulant if medically appropriate. Non pitting generalized edema noted in flowsheet. LBM 7/8.     Events of Current Admission: 82 yo female PMH COPD, CVA, DM, RCC s/p  partial bilateral nephrectomy, [name], and recent diagnosis of urothelial cancer. Patient initially admitted on 6/10 for scheduled cystectomy and complete nephrectomy. OR course was complicated by small bowel enterotomy and significant intraoperative bleeding (1 L EBL), patient was admitted to ICU in discontinuity with open abdomen. Patient went to the OR on 6/12 for small bowel anastomosis and closure with wound VAC placement. Patient weaned off pressors and extubated to nasal cannula pm 6/13. Initially patient required CRRT due to shock, decision to IHD on 6/15. Course was further complicated by delirium and encephalopathy. Patient downgraded out of ICU on 6/16. 6/25 weakness of left upper extremity and MRI showed areas of recent infarction involving the cerebral hemisphere bilaterally and watershed territory pattern.  CT abdomen with and without contrast after  premedication did not show any leak or abscess     Medical Hx:  has a past medical history of Acid reflux, Asthma, well controlled, Cancer of kidney, Chronic lower back pain, Congestive heart disease, COPD (chronic obstructive pulmonary disease), CVA (cerebral infarction) (01/12/2003), Diabetes, Diabetic nephropathy, Fibroids, Gout, H/O: gout, Hyperlipidemia, Hypertension, Neuropathy of hand, and SOB (shortness of breath).     Orders Placed This Encounter   Procedures    Adult diet Therapeutic/ Modified; Solid; Pureed (IDDSI level 4); Mildly thick (IDDSI level 2); Room service: No    Nepro Shake Quantity: A. One; Frequency: BID (2 times a day) - with Lunch and Dinner      07/24/22 2200 07/25/22 2200 07/26/22 0800   Intake (mL)   Percent Meal Consumed (%)  --   --   --    Data Collected by  --   --   --    Supplement Source Prosource TF Glucerna Glucerna   Percent Supplement Consumed (%) 0% 0% 0%  (pt refused)      07/26/22 1200 07/27/22 1744 07/28/22 1000   Intake (mL)   Percent Meal Consumed (%) 75% 75% 75%   Data Collected by  --  Clinical Technician  --    Supplement Source  --   --  Glucerna   Percent Supplement Consumed (%) 0%  --  0%      07/28/22 1400 07/28/22 1835 07/28/22  2200   Intake (mL)   Percent Meal Consumed (%) 75% 75%  --    Data Collected by  --   --   --    Supplement Source  --   --  Ensure Plus Hi Protein - Chocolate   Percent Supplement Consumed (%)  --   --  0%     ANTHROPOMETRIC  Height: 157.5 cm (5\' 2" )  Weight: 97.4 kg (214 lb 11.2 oz)  Body mass index is 39.27 kg/m.     ESTIMATED NEEDS  Total Daily Energy Needs: 1566 to 2001 kcal  Method for Calculating Energy Needs: 18 kcal - 23 kcal per kg  at 87 kg (Actual body weight)  Rationale: obese, noncritical, HD     Total Daily Protein Needs: 104.4 to 130.5 g  Method for Calculating Protein Needs: 2 g - 2.5 g per kg at 52.2 kg (Ideal body weight)  Rationale: obese, noncritical, HD    Total Daily Fluid Needs: 1044 to 1305 ml  Method for  Calculating Fluid Needs: 20 ml - 25 ml  per kg at 52.2 kg (Ideal body weight)  Rationale: bmi, age    Pertinent Medications:   Current Facility-Administered Medications   Medication Dose Route Frequency    acetaminophen  650 mg Oral 4 times per day    Or    acetaminophen  650 mg Oral 4 times per day    amiodarone  200 mg per NG tube Daily    [Held by provider] apixaban  2.5 mg Oral Q12H SCH    carvedilol  12.5 mg Oral Q12H SCH    darbepoetin alfa  0.45 mcg/kg Subcutaneous Weekly    glycopyrrolate  0.2 mg Intravenous Q8H    [START ON 07/30/2022] insulin glargine  5 Units Subcutaneous QAM    insulin lispro  1-8 Units Subcutaneous Q6H    pantoprazole  40 mg per NG tube Q12H [facility]    prenatal vitamin  1 tablet Oral Daily    thiamine  100 mg per NG tube Daily      heparin infusion 25,000 units/500 mL (Cardiac/Low Intensity) 9 Units/kg/hr (07/29/22 0521)     PRN Meds given in the past 48 hours: zofran 7/8    Pertinent labs:  Recent Labs   Lab 07/29/22  0350 07/28/22  0537 07/27/22  0338 07/26/22  0558 07/25/22  0351   Sodium 142 143 138 138 137   Potassium 4.4 3.9 4.0 4.0 3.7   Chloride 103 103 99 99 100   CO2 25 25 23 23 23    BUN 59* 41* 61* 45* 29*   Creatinine 5.3* 3.9* 4.7* 3.8* 2.5*   Glucose 105* 81 182* 170* 163*   Calcium 9.5 8.7 8.9 8.7 8.0   Magnesium 2.1 1.9 2.0 2.0 1.8   Phosphorus 3.3 2.7 3.2 2.9 2.3   GFR 7.5* 10.8* 8.6* 11.1* 18.4*     Recent Labs   Lab 07/29/22  0753 07/29/22  0530 07/28/22  2319 07/28/22  1843 07/28/22  1124   Whole Blood Glucose POCT 105* 106* 109* 163* 160*                                                      MONITORING/EVALUATION     1. Goal: Patient will meet >80% of nutritional needs via tube feeding by  next RD follow up - complete   2. Goal: Pt will consume >75% of meals and ONS by follow up - active     Nutrition Risk Level: High (will follow up at least 2 times per week and PRN)     Vashawn Ekstein S. [name] MS, RD, CNSC  Clinical Dietitian

## 2022-07-29 NOTE — Progress Notes (Signed)
Infectious Diseases & Tropical Medicine  Progress Note    07/29/2022   Shelby Barron ZOX:[id number]:[id number] is a 82 y.o. female,       Assessment:     Encephalopathy  Acute CVA  History of bladder cancer and renal failure  Status post radical cystectomy/bilateral nephrectomy, enterectomy and lysis of adhesion (06/28/2022)  S/p small bowel resection  No fever or leukocytosis  End-stage renal disease on hemodialysis  Atrial fibrillation  Risk of aspiration    Plan:     Continue monitoring without antibiotics  Continue local wound care  Awaiting debridement of abdominal wound after INR is stabilized  Follow-up chest x-ray  Continue probiotics  Monitor electrolytes and renal functions closely  Monitor clinically  Discussed with Dr.Al Najafi    ROS:     General: No fever, awake, comfortable  HEENT: NG tube removed  Respiratory: No cough or shortness of breath  Gastrointestinal: Abdominal wall wound/wound VAC in place  Genito-Urinary: no hematuria  Musculoskeletal: no edema  Neurological: awake  Dermatological: No rash    Physical Examination:     Blood pressure 145/64, pulse 87, temperature 98.4 F (36.9 C), temperature source Oral, resp. rate 19, height 1.575 m (5\' 2" ), weight 97.4 kg (214 lb 11.2 oz), SpO2 97%.    General Appearance: Comfortable  HEENT: Pupils are equal, round, and reactive to light.   Lungs: Clear to auscultation  Heart: Regular rate and rhythm  Chest: Symmetric chest wall expansion.   Abdomen: soft, no hepatosplenomegaly, no bowel sounds, abdominal wall wound   Neurological: awake  Extremities: No edema    Laboratory And Diagnostic Studies:     Recent Labs     07/29/22  0350 07/28/22  1710   WBC 10.85* 10.43*   Hemoglobin 7.4* 7.7*   Hematocrit 22.7* 23.2*   Platelet Count 365* 386*     Recent Labs     07/29/22  0350 07/28/22  0537   Sodium 142 143   Potassium 4.4 3.9   Chloride 103 103   CO2 25 25   BUN 59* 41*   Creatinine 5.3* 3.9*   Glucose 105* 81   Calcium 9.5 8.7     No results for  input(s): "AST", "ALT", "ALKPHOS", "PROT", "ALB" in the last 72 hours.      Current Meds:      Scheduled Meds: PRN Meds:    acetaminophen, 650 mg, Oral, 4 times per day   Or  acetaminophen, 650 mg, Oral, 4 times per day  amiodarone, 200 mg, per NG tube, Daily  [Held by provider] apixaban, 2.5 mg, Oral, Q12H SCH  carvedilol, 12.5 mg, Oral, Q12H SCH  darbepoetin alfa, 0.45 mcg/kg, Subcutaneous, Weekly  glycopyrrolate, 0.2 mg, Intravenous, Q8H  [START ON 07/30/2022] insulin glargine, 5 Units, Subcutaneous, QAM  insulin lispro, 1-8 Units, Subcutaneous, Q6H  pantoprazole, 40 mg, per NG tube, Q12H SCH  prenatal vitamin, 1 tablet, Oral, Daily  thiamine, 100 mg, per NG tube, Daily        Continuous Infusions:   heparin infusion 25,000 units/500 mL (Cardiac/Low Intensity) 9 Units/kg/hr (07/29/22 0521)      sodium chloride, , PRN  acetaminophen, 650 mg, Q6H PRN  albumin human, 100 mL, PRN  albuterol-ipratropium, 3 mL, Q6H PRN  benzocaine, 1 spray, TID PRN  dextrose, 15 g of glucose, PRN   Or  dextrose, 12.5 g, PRN   Or  dextrose, 12.5 g, PRN   Or  glucagon (rDNA), 1 mg, PRN  diphenhydrAMINE, 25 mg, Q6H  PRN  hydrALAZINE, 10 mg, Q3H PRN  labetalol, 10 mg, Q15 Min PRN  lidocaine 2% jelly, , Once PRN  naloxone, 0.2 mg, PRN  ondansetron, 4 mg, Q6H PRN  oxyCODONE, 2.5 mg, Q4H PRN  phenol, 1 spray, Q2H PRN  sodium chloride, 100 mL, Q1H PRN  sodium chloride, 250 mL, PRN          Mellie Buccellato A. Janalyn Rouse, M.D.  07/29/2022  9:47 AM

## 2022-07-29 NOTE — PT Progress Note (Signed)
Physical Therapy Treatment  Shelby Barron  Post Acute Care Therapy Recommendations   Discharge Recommendations:  SNF    DME needs IF patient is discharging home: No additional equipment/DME recommended at this time    Therapy discharge recommendations may change with patient status.  Please refer to most recent note for up-to-date recommendations.    Unit: [facility] [address] [facility]  Bed: [phone]    ___________________________________________________    Time of treatment:  PT Received On: 07/29/22  Start Time: 1700  Stop Time: 1730  Time Calculation (min): 30 min  Total Treatment Time (min): 15 (co tx with OT) to maximize safety during EOB activity, decr risk of injury on therapists    Chart Review and Collaboration with Care Team: 5 minutes, not included in above time.    PT Visit Number: 4    ___________________________________________________    Precautions:   Precautions  Weight Bearing Status: no restrictions  Aspiration Precautions: see SLP recommendations  Fall Risks: High, Impaired balance/gait, Impaired mobility, Muscle weakness, Mental status change, History of fall(s)  Seizure Precautions: Yes (comment date of last seizure)  Communication Precautions: Expressive aphasia, Receptive aphasia  Other Precautions: skin & pressure ulcer; SBP 120-160; L side flaccid    Personal Protective Equipment (PPE)  gloves and procedure mask    Updated X-Rays/Tests/Labs:  Lab Results   Component Value Date/Time    HGB 7.4 (L) 07/29/2022 03:50 AM    HGB 8.6 (L) 06/03/2022 04:23 AM    HCT 22.7 (L) 07/29/2022 03:50 AM    HCT 26.8 (L) 06/03/2022 04:23 AM    K 4.4 07/29/2022 03:50 AM    K 3.6 06/03/2022 04:23 AM    NA 142 07/29/2022 03:50 AM    NA 137 06/03/2022 04:23 AM    INR 1.1 07/14/2022 04:35 AM    INR 1.1 06/01/2022 03:15 AM    TROPI 9.6 05/26/2022 06:37 PM    TROPI -2.2 05/26/2022 06:37 PM    TROPI 11.8 05/26/2022 03:26 PM       All imaging reviewed, please see chart for details.      Subjective:  "[name]."     Patient Goal: pt does not state goal    Pain Assessment  Pain Assessment: No/denies pain           Patient's medical condition is appropriate for Physical Therapy intervention at this time.  Family and/or guardian are agreeable to patient's participation in the therapy session. Patient is unable to indicate agreement for the therapy session but is able to participate in the selected activities. Nursing clears patient for therapy.      Objective:  Observation of Patient/Vital Signs:  BP 120/67   Pulse 80   Temp 97.7 F (36.5 C) (Oral)   Resp 18   Ht 1.575 m (5\' 2" )   Wt 97.4 kg (214 lb 11.2 oz)   SpO2 100%   BMI 39.27 kg/m   VSS     Cognition/Neuro Status  Arousal/Alertness: Delayed responses to stimuli  Attention Span: Difficulty attending to directions  Orientation Level: Oriented to person;Oriented to place;Disoriented to time;Disoriented to situation  Memory: Decreased recall of recent events  Following Commands: Follows one step commands with increased time;25%  Safety Awareness: maximal verbal instruction  Insights: Educated in Engineer, building services  Problem Solving: dependent  Behavior: flat affect  Motor Planning: decreased initiation;decreased processing speed    Musculoskeletal Examination          Functional Mobility  Supine to  Sit: Maximal Assist;to Right  Scooting to HOB: Maximal Assist (x 2)  Scooting to EOB: Maximal Assist  Sit to Supine: Maximal Assist;to Left  Sit to Stand: Unable to assess (Comment)     Locomotion  Ambulation: Unable to assess (Comment)  Distance Walked (ft) (Step 6,7): 0 Feet    Therapeutic Exercise  Knee AROM : able to perform LAQs spontaneouson on RLE  Ankle Pumps: shows very mild ankle PF/DF, toe flexion/extension on L foot while EOB     Neuro Re-Ed  Sitting Balance: with support;maximal assist         mRS Scale - mRS Certified Providers  I am certified in mRS: Yes  mRS Assessment Source: Patient  mRS Value: 5 - Severe disability        Educated the Patient and  Family to role of physical therapy, plan of care, goals of therapy and safety with mobility and ADLs, energy conservation techniques, discharge instructions, home safety with limited indication of understanding.  Additional education required..    Patient left in bed with alarm and all other medical equipment in place and call bell and all personal items/needs within reach.  RN notified of session outcome. CM team and attending have been previously notified of d/c recommendation; no updates to d/c recommendation since that time.      Assessment:  Pt with improved ability to move/activate musculature on LLE today. Exhibits some slight ankle/toe ROM. Poor head/trunk control, unable to hold head up to midline, significant rounded shoulders/posture in sitting EOB. Hands placed at either side to initiate incr WB, minimal carryover at this time. Rec SNF d/t general weakness, reduced fxl mobility, deconditioning.                 PMP Activity: Step 4 - Dangle at Bedside  Distance Walked (ft) (Step 6,7): 0 Feet    Plan:  Treatment/Interventions: Exercise, Gait training, Stair training, Neuromuscular re-education, Functional transfer training, LE strengthening/ROM, Endurance training, Cognitive reorientation, Patient/family training, Equipment eval/education, Bed mobility, Continued evaluation      PT Frequency: 2-3x/wk   Continue plan of care.    Goals:  Goals  Goal Formulation: With patient/family  Time for Goal Acheivement: By time of discharge  Goals: Select goal  Pt Will Go Supine To Sit: to maximize functional mobility and independence, with moderate assist, Not met  Pt Will Sit Edge of Bed: 1-2 min, to maximize functional mobility and independence, with moderate assist, Partly met  Pt Will Perform Sit to Stand: to maximize functional mobility and independence, with moderate assist, Not met  Pt Will Transfer Bed/Chair: with moderate assist, to maximize functional mobility and independence, Not met  Pt Will Ambulate:  31-50 feet, with rolling walker, with moderate assist, to maximize functional mobility and independence, Not met      Ulice Dash, PT, DPT   [place] License #[id number]  [facility]  M 10-6:30, T 10-4:30, W 10-5:30, Th 10-8:30 F 11-8:30  Spectralink [id number]  07/29/2022 5:58 PM      [facility]  Patient: Shelby Barron MRN#: [id number]  Unit: [facility] [address] Bed: [phone]

## 2022-07-29 NOTE — Progress Notes (Signed)
Pt is awake and restless during tx. R permanent catheter access is optimally working post dialysis. BP soft during tx. UF lowered  Hemodialysis ended and pt. able to finish 3.5hrs  tx.  Able to removed Net Fluid of 1.7L.  Reports were given to Primary nurse.       07/29/22 1407   Treatment Summary   Time Off Machine 1405   Duration of Treatment (Hours) 3.5   Treatment Type 2:1   Dialyzer Clearance Moderately streaked   Fluid Volume Off (mL) 2100   Prime Volume (mL) 200   Rinseback Volume (mL) 200   Fluid Given: Normal Saline (mL) 0   Fluid Given: PRBC  0 mL   Fluid Given: Albumin (mL) 0   Fluid Given: Other (mL) 0   Total Fluid Given 400   Hemodialysis Net Fluid Removed 1700   Post Treatment Assessment   Post-Treatment Weight (Kg) -1.7   Patient Response to Treatment fairly tolerated   Additional Dialyzer Used 0   Permacath Catheter - Tunneled 07/14/22 Subclavian vein catheter Right   Placement Date/Time: 07/14/22 1404   Inserted by: Dr. Welton Flakes  Access Type: Subclavian vein catheter  Orientation: Right  Central Line Infection Prevention Education provided?: Yes  Hand Hygiene: Alcohol based hand scrub;Soap and water  Line cart u...   Line necessity reviewed? Apheresis/hemodialysis   Site Assessment Clean;Dry;Intact   Catheter Lumen Volume Venous 1.6 mL   Catheter Lumen Volume Arterial 1.6 mL   Dressing Status and Intervention Dressing Intact;Clean & Dry   Tego/Curos Caps on Catheter Yes   Dressing Type Transparent;Biopatch   Dressing Status Clean;Dry;Intact   Dressing Change Due 08/02/22   Vitals   Heart Rate 77   Resp Rate 18   BP 111/53   SpO2 99 %   O2 Device None (Room air)   Assessment   Mental Status Other (Comment);Restless;Does not follow commands   Cardiac Regularity Return to [facility]   Cardiac Rhythm Normal sinus rhythm   Respiratory  WDL   Bilateral Breath Sounds Diminished   R Breath Sounds Diminished   L Breath Sounds Diminished   RLE Edema Non Pitting Edema   LLE Edema Non Pitting Edema   General  Skin Color Appropriate for ethnicity   Skin Condition/Temp Return to [facility]   Abdomen Inspection Rounded   Pain Assessment   Charting Type Reassessment   Pain Scale Used CPOT   Education   Person taught Patient  (pt don't follw commands)   Knowledge basis None   Topics taught Procedure   Teaching Tools Explain   Reponse Needs Reinforcement;No Evidence of Learning

## 2022-07-29 NOTE — Progress Notes (Signed)
Arrived in HD room, and received report pre-dialysis. Pt is awake. R permanent catheter access is optimally working. Timeouts and safety checks done. Will closely monitor the pt.       07/29/22 1030   Bedside Nurse Communication   Name of bedside RN - pre dialysis Lorriane Shire, RN   Treatment Initiation- With Dialysis Precautions   Time Out/Safety Check Completed Yes   Consent for HD signed for this hospitalization (Date) 06/01/22   Consent for HD signed for this hospitalization (Time) 1516   Blood Consent Verified N/A   Dialysis Precautions All Connections Secured;Saline Line Double Clamped;Venous Parameters Set;Arterial Parameters Set;Air Foam Detecctor Engaged   Dialysis Treatment Type Routine   Is patient diabetic? Yes   RO/Hemodialysis Cabin crew   Is Total Chlorine less than 0.1 ppm? Yes   Orignial Total Chlorine Testing Time 0915   At 4 Hour Total Chlorine Testing Time 1315   RO/Hemodialysis Arts development officer Number 3    Machine Serial Number [id number]   RO # [id number]   RO Serial # [id number]   pH 7.2   Pressure Test Verified Yes   Alarms Verified Passed   Machine Temperature 98.6 F (37 C)   Alarms Verified Yes   Na+ mEq (Machine) 138 mEq   Bicarb mEq (Machine) 35 mEq   Hemodialysis Conductivity (Machine) 13.8   Hemodialysis Conductivity (Meter) 13.8   Dialyzer Lot Number [id number]   Tubing Lot Number [id number]   RO Machine Log Completed Yes   Hepatitis Status   HBsAg (Antigen) Result Negative   HBsAg Date Drawn 07/03/22   HBsAg Repeat Draw Due Date 07/31/22   Dialysis Weight   Pre-Treatment Weight (Kg) 97.4   Scale Type ICU Bed Scale   Vitals   Temp 97.9 F (36.6 C)   Heart Rate 92   Resp Rate 18   BP 166/72   SpO2 99 %   O2 Device None (Room air)   Assessment   Mental Status Other (Comment);Does not follow commands  (Pt awake)   Cardiac Regularity Return to [facility]   Cardiac Rhythm Normal sinus rhythm   Respiratory Pattern Return to WDL   Bilateral Breath Sounds Diminished   R Breath Sounds  Diminished   L Breath Sounds Diminished   RLE Edema Non Pitting Edema   LLE Edema Non Pitting Edema   General Skin Color Appropriate for ethnicity   Skin Condition/Temp Return to [facility]   Abdomen Inspection Rounded   Mobility Bed   Permacath Catheter - Tunneled 07/14/22 Subclavian vein catheter Right   Placement Date/Time: 07/14/22 1404   Inserted by: Dr. Welton Flakes  Access Type: Subclavian vein catheter  Orientation: Right  Central Line Infection Prevention Education provided?: Yes  Hand Hygiene: Alcohol based hand scrub;Soap and water  Line cart u...   Line necessity reviewed? Apheresis/hemodialysis   Site Assessment Clean;Dry;Intact   Catheter Lumen Volume Venous 1.6 mL   Catheter Lumen Volume Arterial 1.6 mL   Dressing Status and Intervention Dressing Intact;Clean & Dry   Tego/Curos Caps on Catheter Yes   Dressing Type Transparent;Biopatch   Dressing Status Clean;Dry;Intact   Dressing Change Due 08/02/22   Pain Assessment   Charting Type Assessment   Pain Scale Used CPOT   Hemodialysis Comments   Pre-Hemodialysis Comments Timout and safety checks done

## 2022-07-29 NOTE — OT Progress Note (Signed)
Occupational Therapy Treatment  Shelby Barron        Post Acute Care Therapy Recommendations   Discharge Recommendations:  SNF      DME needs IF patient is discharging home: Hospital bed, [facility]    Therapy discharge recommendations may change with patient status.  Please refer to most recent note for up-to-date recommendations.    Unit: [facility]  Bed: [phone].[id number]    ___________________________________________________    Time of treatment:  OT Received On: 07/29/22  Start Time: 1700  Stop Time: 1730  Time Calculation (min): 30 min       Chart Review and Collaboration with Care Team: 5 minutes, not included in above time.    OT Visit Number: 5      Precautions and Contraindications:    Precautions  Weight Bearing Status: no restrictions  Aspiration Precautions: see SLP recommendations  Fall Risks: High;Impaired balance/gait;Impaired mobility;Muscle weakness;Mental status change;History of fall(s)  Seizure Precautions: Yes (comment date of last seizure)  Communication Precautions: Expressive aphasia;Receptive aphasia  Other Precautions: skin & pressure ulcer; SBP 120-160; L side flaccid    Personal Protective Equipment (PPE)  gloves and procedure mask    Updated Labs:  Lab Results   Component Value Date/Time    HGB 7.4 (L) 07/29/2022 03:50 AM    HGB 8.6 (L) 06/03/2022 04:23 AM    HCT 22.7 (L) 07/29/2022 03:50 AM    HCT 26.8 (L) 06/03/2022 04:23 AM    K 4.4 07/29/2022 03:50 AM    K 3.6 06/03/2022 04:23 AM    NA 142 07/29/2022 03:50 AM    NA 137 06/03/2022 04:23 AM    INR 1.1 07/14/2022 04:35 AM    INR 1.1 06/01/2022 03:15 AM    TROPI 9.6 05/26/2022 06:37 PM    TROPI -2.2 05/26/2022 06:37 PM    TROPI 11.8 05/26/2022 03:26 PM       All imaging reviewed, please see chart for details.    Subjective: "No."         Patient's medical condition is appropriate for Occupational Therapy intervention at this time.  Patient is agreeable to participation in the therapy session. Nursing clears patient  for therapy.  Pain Assessment  Pain Assessment: No/denies pain      Objective:  Observation of Patient/Vital Signs:  Stable      Cognition/Neuro Status  Arousal/Alertness: Delayed responses to stimuli  Attention Span: Difficulty attending to directions  Orientation Level: Oriented to person;Oriented to place;Disoriented to time;Disoriented to situation  Memory: Decreased recall of recent events  Following Commands: Follows one step commands with increased time;25%  Safety Awareness: maximal verbal instruction  Insights: Educated in Engineer, building services  Problem Solving: dependent  Behavior: flat affect  Motor Planning: decreased initiation;decreased processing speed    Functional Mobility  Scooting Transfers: Maximal Assist;additional time (X2)  Supine to Sit Transfers: Maximal Assist;additional time (X2)  Sit to Supine Transfers: Maximal Assist;additional time (X2)  Sit to Stand Transfers: not tested    Self-care and Home Management  Grooming: Maximal Assist;edge of bed;wash/dry face;wash/dry hands      Educated the Patient and Family to role of occupational therapy, plan of care, goals of therapy and safety with mobility and ADLs, energy conservation techniques with verbalized understanding  and demonstrated understanding.     Patient left in bed with alarm and all other medical equipment in place and call bell and all personal items/needs within reach.  RN notified of session outcome.  Assessment: Pt is progressing towards OT goals. Main limitations are decreased activity endurance, decreased functional mobility, weakness and fatigue limiting participation in functional mobility and ADL tasks. Pt has demonstrated an increase in sitting balance. Pt would benefit from continued OT services in order to maximize potential. Therapist continues to recommend [place] to SNF.               PMP Activity: Step 4 - Dangle at Bedside  Distance Walked (ft) (Step 6,7): 0 Feet      Plan:  OT Frequency Recommended: 2-3x/wk  Goal  Formulation: Patient      Time For Goal Achievement: by time of discharge  ADL Goals  Patient will groom self: Moderate Assist, Not met     Neuro Re-Ed Goals  Pt will sit at edge of bed: Maximal Assist, for 3 minutes, Partly met  Other Goal: Pt will follow one step v commands 50% of the time  Musculoskeletal Goals  Pt will perform Home Exercise Program: maximal assist, with caregiver/family assist, Goal met  Executive Fucntion Goals  Pt will demonstrate attention to task: with minimal assist, to increase ability to complete ADLs, Not met  Other Goal: Pt will be oriented X 3 75% of the time              Continue plan of care.      Gerhard Munch, COTA/L    Physical Medicine and Rehabilitation  [facility]  [phone]    07/29/2022  5:54 PM    [facility]  Patient: Shelby Barron MRN#: [id number]   Unit: [facility] [address] [facility] Bed: [phone]

## 2022-07-29 NOTE — Plan of Care (Incomplete)
Problem: Moderate/High Fall Risk Score >5  Goal: Patient will remain free of falls  Flowsheets (Taken 07/29/2022 0019)  High (Greater than 13):  Marland Kitchen HIGH-Bed alarm on at all times while patient in bed  . HIGH-Visual cue at entrance to patient's room  . HIGH-Utilize chair pad alarm for patient while in the chair  . HIGH-Apply yellow "Fall Risk" arm band     Problem: Compromised Sensory Perception  Goal: Sensory Perception Interventions  Flowsheets (Taken 07/28/2022 2320)  Sensory Perception Interventions: Offload heels, Pad bony prominences, Reposition q 2hrs/turn Clock, Q2 hour skin assessment under devices if present     Problem: Compromised Moisture  Goal: Moisture level Interventions  Flowsheets (Taken 07/28/2022 2320)  Moisture level Interventions: Moisture wicking products, Moisture barrier cream     Problem: Compromised Nutrition  Goal: Nutrition Interventions  Flowsheets (Taken 07/28/2022 2320)  Nutrition Interventions: Discuss nutrition at Rounds, I&Os, Document % meal eaten, Daily weights     Problem: Compromised Friction/Shear  Goal: Friction and Shear Interventions  Flowsheets (Taken 07/28/2022 2320)  Friction and Shear Interventions: Pad bony prominences, Off load heels, HOB 30 degrees or less unless contraindicated, Consider: TAP seated positioning, Heel foams     Problem: Infection Prevention  Goal: Free from infection  Flowsheets (Taken 07/29/2022 0019)  Free from infection:  . Monitor/assess vital signs  . Assess incision for evidence of healing  . Assess for signs and symptoms of infection     Problem: Impaired Mobility  Goal: Mobility/Activity is maintained at optimal level for patient  Flowsheets (Taken 07/29/2022 0019)  Mobility/activity is maintained at optimal level for patient: Encourage independent activity per ability     Problem: Constipation  Goal: Fluid and electrolyte balance are achieved/maintained  Flowsheets (Taken 07/29/2022 0019)  Fluid and electrolyte balance are achieved/maintained:  .  Monitor/assess lab values and report abnormal values  . Assess and reassess fluid and electrolyte status     Problem: Safety  Goal: Patient will be free from injury during hospitalization  Flowsheets (Taken 07/29/2022 0019)  Patient will be free from injury during hospitalization:  . Assess patient's risk for falls and implement fall prevention plan of care per policy  . Provide and maintain safe environment  . Use appropriate transfer methods  . Ensure appropriate safety devices are available at the bedside  . Include patient/ family/ care giver in decisions related to safety  . Hourly rounding  Goal: Patient will be free from infection during hospitalization  Flowsheets (Taken 07/29/2022 0019)  Free from Infection during hospitalization:  . Assess and monitor for signs and symptoms of infection  . Monitor all insertion sites (i.e. indwelling lines, tubes, urinary catheters, and drains)  . Encourage patient and family to use good hand hygiene technique     Problem: Hemodynamic Status: Cardiac  Goal: Stable vital signs and fluid balance  Flowsheets (Taken 07/29/2022 0019)  Stable vital signs and fluid balance:  . Assess signs and symptoms associated with cardiac rhythm changes  . Monitor lab values     Problem: Renal Instability  Goal: Fluid and electrolyte balance are achieved/maintained  Flowsheets (Taken 07/29/2022 0019)  Fluid and electrolyte balance are achieved/maintained:  . Monitor/assess lab values and report abnormal values  . Observe for cardiac arrhythmias     Problem: Every Day - Stroke  Goal: Mobility/Activity is maintained at optimal level for patient  Flowsheets (Taken 07/29/2022 0019)  Mobility/activity is maintained at optimal level for patient: Encourage independent activity per ability  Goal: Skin integrity is  maintained or improved  Flowsheets (Taken 07/29/2022 0019)  Skin integrity is maintained or improved:  Marland Kitchen Assess Braden Scale every shift  . Turn or reposition patient every 2 hours or as needed  unless able to reposition self  . Relieve pressure to bony prominences  . Keep skin clean and dry  . Increase activity as tolerated/progressive mobility     Problem: Fluid and Electrolyte Imbalance/ Endocrine  Goal: Fluid and electrolyte balance are achieved/maintained  Flowsheets (Taken 07/29/2022 0019)  Fluid and electrolyte balance are achieved/maintained:  . Monitor/assess lab values and report abnormal values  . Assess and reassess fluid and electrolyte status     NURSING SHIFT NOTE     Patient: Shelby Barron  Day: 31      SHIFT EVENTS     Shift Narrative/Significant Events (PRN med administration, fall, RRT, etc.):   Patient is alert and oriented to self. On room air, scheduled mediation administered, pt tolerated well. On Heparin drip. Repositioned q2 hrs, assisted with ADL's  Safety and fall precautions remain in place. Purposeful rounding completed.          ASSESSMENT     Changes in assessment from patient's baseline this shift:    Neuro:   CV:   Pulm:   Peripheral Vascular:   HEENT:   GI:   BM during shift: , Last BM: Last BM Date: 07/26/22  GU:    Integ:   MS:     Pain:   Pain Interventions:   Medications Utilized:     Mobility: PMP Activity: Step 1 - Bedrest of Distance Walked (ft) (Step 6,7): 0 Feet           Lines     Patient Lines/Drains/Airways Status       Active Lines, Drains and Airways       Name Placement date Placement time Site Days    Permacath Catheter - Tunneled 07/14/22 Subclavian vein catheter Right 07/14/22  1404  -- 14    Peripheral IV 07/25/22 20 G Right Antecubital 07/25/22  1700  Antecubital  3    Peripheral IV 07/25/22 22 G Anterior;Left Forearm 07/25/22  1700  Forearm  3    Midline IV 07/01/22 Anterior;Left Upper Arm 07/01/22  1315  Upper Arm  27                         VITAL SIGNS

## 2022-07-30 LAB — WHOLE BLOOD GLUCOSE POCT
Whole Blood Glucose POCT: 91 mg/dL (ref 70–100)
Whole Blood Glucose POCT: 81 mg/dL (ref 70–100)
Whole Blood Glucose POCT: 82 mg/dL (ref 70–100)
Whole Blood Glucose POCT: 98 mg/dL (ref 70–100)
Whole Blood Glucose POCT: 104 mg/dL — ABNORMAL HIGH (ref 70–100)

## 2022-07-30 LAB — BASIC METABOLIC PANEL
Anion Gap: 11 (ref 5.0–15.0)
BUN: 31 mg/dL — ABNORMAL HIGH (ref 7–21)
CO2: 26 mEq/L (ref 17–29)
Calcium: 8.5 mg/dL (ref 7.9–10.2)
Chloride: 100 mEq/L (ref 99–111)
Creatinine: 3.2 mg/dL — ABNORMAL HIGH (ref 0.4–1.0)
GFR: 13.7 mL/min/{1.73_m2} — ABNORMAL LOW (ref >=60.0)
Glucose: 85 mg/dL (ref 70–100)
Potassium: 4.2 mEq/L (ref 3.5–5.3)
Sodium: 137 mEq/L (ref 135–145)

## 2022-07-30 LAB — CBC
Absolute nRBC: 0 10*3/uL (ref <=0.00)
Hematocrit: 22.1 % — ABNORMAL LOW (ref 34.7–43.7)
Hemoglobin: 7.1 g/dL — ABNORMAL LOW (ref 11.4–14.8)
MCH: 29.5 pg (ref 25.1–33.5)
MCHC: 32.1 g/dL (ref 31.5–35.8)
MCV: 91.7 fL (ref 78.0–96.0)
MPV: 9.8 fL (ref 8.9–12.5)
Platelet Count: 347 10*3/uL — ABNORMAL HIGH (ref 142–346)
RBC: 2.41 10*6/uL — ABNORMAL LOW (ref 3.90–5.10)
RDW: 16 % — ABNORMAL HIGH (ref 11–15)
WBC: 12.61 10*3/uL — ABNORMAL HIGH (ref 3.10–9.50)
nRBC %: 0 /100 WBC (ref <=0.0)

## 2022-07-30 LAB — PHOSPHORUS: Phosphorus: 2.7 mg/dL (ref 2.3–4.7)

## 2022-07-30 LAB — APTT
PTT: 46 s — ABNORMAL HIGH (ref 27–39)
PTT: 28 s (ref 27–39)

## 2022-07-30 LAB — ANTI-XA,UFH
Anti-Xa, UFH: 0.35 IU/mL
Anti-Xa, UFH: 0.2 IU/mL

## 2022-07-30 LAB — MAGNESIUM: Magnesium: 1.8 mg/dL (ref 1.6–2.6)

## 2022-07-30 MED ORDER — INSULIN LISPRO 100 UNIT/ML SOLN (WRAP)
1.0000 [IU] | Freq: Every evening | Status: DC
Start: 2022-07-30 — End: 2022-08-07

## 2022-07-30 MED ORDER — ATORVASTATIN CALCIUM 40 MG PO TABS
40.0000 mg | ORAL_TABLET | Freq: Every evening | ORAL | Status: DC
Start: 2022-07-30 — End: 2022-08-18
  Administered 2022-07-30 – 2022-08-17 (×19): 40 mg via ORAL
  Filled 2022-07-30 (×19): qty 1

## 2022-07-30 MED ORDER — VITAMINS/MINERALS PO TABS
1.0000 | ORAL_TABLET | Freq: Every day | ORAL | Status: DC
Start: 2022-07-30 — End: 2022-08-05
  Administered 2022-07-30 – 2022-08-04 (×5): 1 via ORAL
  Filled 2022-07-30 (×6): qty 1

## 2022-07-30 MED ORDER — UMECLIDINIUM-VILANTEROL 62.5-25 MCG/ACT IN AEPB
1.0000 | INHALATION_SPRAY | Freq: Every morning | RESPIRATORY_TRACT | Status: DC
Start: 2022-07-30 — End: 2022-08-18
  Administered 2022-08-03 – 2022-08-18 (×10): 1 via RESPIRATORY_TRACT
  Filled 2022-07-30: qty 7

## 2022-07-30 MED ORDER — INSULIN LISPRO 100 UNIT/ML SOLN (WRAP)
1.0000 [IU] | Freq: Three times a day (TID) | Status: DC
Start: 2022-07-30 — End: 2022-08-07

## 2022-07-30 NOTE — Progress Notes (Signed)
CM met with daughter for 30 mins. And spoke with her regarding d/c planning at length.  Per daughter, she's in the process of touring both [facility] and [facility].  Per daughter she was not impressed with the SNF'S she has visited to date.  CM further educated her on alternative if SNF is not feasible at this time.  Daughter is open to a Home Plan, however, is willing to consider short term SNF pending adjusting home to pt's needs.    CM encouraged daughter to make a decision on SNF choice to start the auth process.  CM will continue to follow and coordinate all d/c needs.

## 2022-07-30 NOTE — Progress Notes (Signed)
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 s/p bilateral radical nephrectomies and  cystectomy on 06/28/22  - complicated by small bowel enterotomy and blood loss   New onset afib - on amiodarone and eliquis  - eliquis on hold  High grade urothelial bladder CA: s/p bilateral nephrectomies and cystectomy c/b mall bowel enterotomy and blood loss (~1L) on 06/28/22  - eliquis on hold, on heparin gtt given plan for bedside debridement of necrotic wound planned today   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 - remains low, adequate iron - on aranesp  Encephalopathy - same  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   Hypophosphatemia -  better  HTN - BP high     Recommendations:     S/p HD yesterday with 1.7L net UF  Plan for HD tomorrow per routine schedule  Encourage nutrition - NGT removed  On heparin gtt    Case discussed with: patient, family friend    Delice Bison, MD  [facility]  [phone] (office)  X [phone] (FFX Spectra-Link)      Subjective: Awake, not talking much today       Review of Systems:     No CP or abd pain    Physical Exam:     Vitals:    07/30/22 0307 07/30/22 0503 07/30/22 0705 07/30/22 1112   BP: 149/61 147/63 117/67 122/67   Pulse: 80 78 75 71   Resp: 17  19 18    Temp: 98.4 F (36.9 C)  98.6 F (37 C) 98.6 F (37 C)   TempSrc: Axillary  Oral Oral   SpO2: 94%  100% 100%   Weight: 89.6 kg (197 lb 8.5 oz)      Height:           Intake and Output Summary (Last 24 hours) at Date Time    Intake/Output  Summary (Last 24 hours) at 07/30/2022 1451  Last data filed at 07/30/2022 1301  Gross per 24 hour   Intake 190 ml   Output --   Net 190 ml         General: awake, in no distress  Cardiovascular: regular rate and rhythm  Lungs: bilateral air entry  Abdomen: soft  Extremities: no edema  Access: R permacath     Meds:      Scheduled Meds: PRN Meds:    acetaminophen, 650 mg, Oral, 4 times per day   Or  acetaminophen, 650 mg, Oral, 4 times per day  amiodarone, 200 mg, per NG tube, Daily  [Held by provider] apixaban, 2.5 mg, Oral, Q12H SCH  atorvastatin, 40 mg, Oral, QHS  carvedilol, 12.5 mg, Oral, Q12H SCH  darbepoetin alfa, 0.45 mcg/kg, Subcutaneous, Weekly  glycopyrrolate, 0.2 mg, Intravenous, Q8H  insulin glargine, 5 Units, Subcutaneous, QAM  insulin lispro, 1-3 Units, Subcutaneous, QHS  insulin lispro, 1-5 Units, Subcutaneous, TID AC  pantoprazole, 40 mg, per NG tube, Q12H SCH  thiamine, 100 mg,  per NG tube, Daily  umeclidinium-vilanterol, 1 puff, Inhalation, QAM  vitamins/minerals, 1 tablet, Oral, Daily          Continuous Infusions:   [Held by provider] heparin infusion 25,000 units/500 mL (Cardiac/Low Intensity) Stopped (07/30/22 0459)        sodium chloride, , PRN  acetaminophen, 650 mg, Q6H PRN  albuterol-ipratropium, 3 mL, Q6H PRN  benzocaine, 1 spray, TID PRN  dextrose, 15 g of glucose, PRN   Or  dextrose, 12.5 g, PRN   Or  dextrose, 12.5 g, PRN   Or  glucagon (rDNA), 1 mg, PRN  diphenhydrAMINE, 25 mg, Q6H PRN  hydrALAZINE, 10 mg, Q3H PRN  labetalol, 10 mg, Q15 Min PRN  lidocaine 2% jelly, , Once PRN  naloxone, 0.2 mg, PRN  ondansetron, 4 mg, Q6H PRN  oxyCODONE, 2.5 mg, Q4H PRN  phenol, 1 spray, Q2H PRN              Labs:     Recent Labs   Lab 07/30/22  0317 07/29/22  0350 07/28/22  1710   WBC 12.61* 10.85* 10.43*   Hemoglobin 7.1* 7.4* 7.7*   Hematocrit 22.1* 22.7* 23.2*   Platelet Count 347* 365* 386*     Recent Labs   Lab 07/30/22  0317 07/29/22  0350 07/28/22  0537   Sodium 137 142 143   Potassium 4.2 4.4 3.9    Chloride 100 103 103   CO2 26 25 25    BUN 31* 59* 41*   Creatinine 3.2* 5.3* 3.9*   Calcium 8.5 9.5 8.7   Phosphorus 2.7 3.3 2.7   Magnesium 1.8 2.1 1.9   Glucose 85 105* 81   GFR 13.7* 7.5* 10.8*           Signed by: Delice Bison, MD, FASN

## 2022-07-30 NOTE — UM Notes (Signed)
[facility] Utilization Review  NPI: [id number], Tax ID: [id number]  Please Call [phone] with any questions or concerns. Confidential voicemail.  Fax final authorizations and requests for additional information Fax to [phone]    Patient: Shelby Barron  Date: 07/30/2022   LOS: 32 Days  Admission Date: 06/28/2022     CSR 07/27/22    Bladder cancer   s/p open cystectomy,   Eliquis on hold    heparin 25,000 units in dextrose 5% 500 mL infusion (premix)  Rate: 19.9 mL/hr Dose: 11 Units/kg/hr  Weight Dosing Info: 90.5 kg  Freq: Continuous Route: IV     ASSESSMENT/PLAN      KRISTEN FRANGELLA is a 82 y.o. female admitted with Bladder cancer     Toxic metabolic Encephalopathy/delirium resolving  - Patient was upgraded to ICU on 6/30 for lethargy and decreased responsiveness  - CT head was repeated on 6/29, no acute finding  - ABG consistent with respiratory alkalosis  - EEG consistent with encephalopathy no seizure, MRI no acute stroke  - CT abdomen with and without contrast after premedication did not show any leak or abscess  - SLP advanced diet to pureed.   -NG tube removed 7/9  -SLP following daily  - Palliative consulted:  Family is clear they would like no limits to care and patient should remain full code.     Anemia, 2/2 CKD and blood loss during OR  - TSAT 18%  - continue Aranesp per nephrology  - Trend CBC, transfuse Hgb<7. Status post 1U pRBC for Hgb 6.1 on 7/7 with improvement   -H&H has been stable around 8.5  No active signs of bleeding     s/p open cystectomy, bilateral nephrectomies, and resection of small bowel after intra-op enterotomy    Mixed shock - hemorrhagic vs septic: Resolved  Leukocytosis  ?  Necrotic wound tissue  -I reviewed the images from last wound care note on July 8 which showed some necrotic tissue     -I discussed with general surgery who agreed the patient needs debridement but needs to hold Eliquis for 2 days prior  Will hold Eliquis starting now and switch to heparin  drip   Plan for bedside debridement by general surgery team today  Heparin held since a.m. resumed after debridement  I discussed with ID and plan for short course of antibiotic with debridement.  - 6/10 small bowel enteretomy with 1L EBL. Went to the OR on June 12 for small bowel anastomosis, closure of mesenteric defect, closure of abdominal wall with wound VAC application. Wound VAC last changed on 7/11  - Surgery consulted, recommend CTAP with PO contrast that showed no anastomotic leak. Low concern for abscesses as they'd expect these to have a more loculated appearance in the interim between 6/17 and 7/1 imaging  - ID consulted, completed meropenem and Mycamine for 2 weeks on 7/3.    -Discussed with ID and they recommend to monitor off antibiotic for now     Acute CVA on the right frontal lobe most likely from hypotension and shock  History of CVA  Petechial hemorrhage vs cortical laminar necrosis on CTH  - Left upper extremity not moving at all unclear duration  - Neurology following, initially started on ASA 81mg  only due to concern for risk of hemorrhagic conversion. Now OK to switch to Eliquis for Afib and discontinue aspirin per neurology on 7/5 as > 10d post event.  - SBP goal 120-160  - Carotid Doppler showed  plaque with no critical stenosis  - Head CT on 7/7 w/ a few areas of cortical hyperdensity - petechial hemorrhage vs cortical laminar necrosis. Discussed with neuro OK to continue eliquis for now. Plan for repeat CTH today. Neuro exam stable.  - EEG encephalopathic pattern no seizure, repeat MRI no new infarction  - PT OT recommend SNF  -Repeat CT head on 07/26/2022 stable     Acute hypoxemic respiratory failure resolved  COPD  Extubated on 6/13, currently on room air  Maintain SaO2 > 92%  Continue as needed neb treatment  X-ray repeated on 6/29 showed resolution of interstitial edema     ESRD  S/p CRRT this admission, now on HD TTS  Continue dialysis per nephrology  IR removed the nonfunctional  permacath and right Quinton IJ cath and replaced with a new permacath on 6/26.     HTN  On metoprolol   PRN IV labetalol for SBP > 180      New onset Afib with RVR, improved  - CHA2DS2-VASc score is 7  - Status post amiodarone drip. Now stable on oral amiodarone and coreg per cardiology recommendations  - GI and general surgery cleared for anticoagulation. Neurology recommended to hold anticoagulation initiation for 10 days given the size of stroke.   -  started on Eliquis since 07/23/22  Hold Eliquis starting today 7/10 for 2 days prior to wound debridement     Renal cell carcinoma outpatient follow-up hematology oncology  Urothelial carcinoma  Thrombocytopenia  - VTE ppx: SCDs, eliquis     Diabetes Mellitus type 2  -Sliding scale insulin     Nutrition: Pured diet as tolerated  Malnutrition Documentation     Moderate Malnutrition related to inadequate nutritional intake in the setting of acute illness as evidenced by <50% EER > 5 days, mild muscle losses (temporalis, deltoid) - new          Patient has BMI=Body mass index is 36.13 kg/m.  Diagnosis: Obesity based on BMI criteria        Lab 07/30/22  0317 07/29/22  0350 07/28/22  1710   Hemoglobin 7.1* 7.4* 7.7*   Hematocrit 22.1* 22.7* 23.2*   MCV 91.7 91.9 90.6   WBC 12.61* 10.85* 10.43*   Platelet Count 347* 365* 386*           Recent Labs   Lab 07/18/22  0421 06/02/22  0514   Ferritin 2,300.21*  --    Iron 18* 62   TIBC 99* 145*   Iron Saturation 18 43      Anemia Diagnosis: Already documented in note  Code Status: Full Code  Dispo: SNF  Family Contact: Daughter [name]      DVT Prophylaxis:          Current Facility-Administered Medications (Includes Only Anticoagulants, Misc. Hematological)   Medication Dose Route Last Admin    [Held by provider] apixaban (ELIQUIS) tablet 2.5 mg  2.5 mg Oral 2.5 mg at 07/28/22 0957    [Held by provider] heparin 25,000 units in dextrose 5% 500 mL infusion (premix)  11 Units/kg/hr Intravenous Stopped at 07/30/22 0459              07/30/22 1112   BP: 122/67   Pulse: 71   Resp: 18   Temp: 98.6 F (37 C)   SpO2: 100%   NEURO: moving right upper extremity spontaneously, able to move lower extremities for pain but minimal, no movement at all on the left upper extremity  Lab 07/30/22  0317 07/29/22  0350 07/28/22  1710   WBC 12.61* 10.85* 10.43*   RBC 2.41* 2.47* 2.56*   Hemoglobin 7.1* 7.4* 7.7*   Hematocrit 22.1* 22.7* 23.2*   MCV 91.7 91.9 90.6   Platelet Count 347* 365* 386*       Lab 07/30/22  0317 07/29/22  2053 07/29/22  1200   PTT 46* 43* 50*    Scheduled Meds:  Current Facility-Administered Medications   Medication Dose Route Frequency    acetaminophen  650 mg Oral 4 times per day    Or    acetaminophen  650 mg Oral 4 times per day    amiodarone  200 mg per NG tube Daily    [Held by provider] apixaban  2.5 mg Oral Q12H SCH    atorvastatin  40 mg Oral QHS    carvedilol  12.5 mg Oral Q12H SCH    darbepoetin alfa  0.45 mcg/kg Subcutaneous Weekly    glycopyrrolate  0.2 mg Intravenous Q8H    insulin glargine  5 Units Subcutaneous QAM    insulin lispro  1-3 Units Subcutaneous QHS    insulin lispro  1-5 Units Subcutaneous TID AC    pantoprazole  40 mg per NG tube Q12H [facility]    thiamine  100 mg per NG tube Daily    umeclidinium-vilanterol  1 puff Inhalation QAM    vitamins/minerals  1 tablet Oral Daily     Continuous Infusions:   heparin infusion 25,000 units/500 mL (Cardiac/Low Intensity) Stopped (07/30/22 0459)     PRN Meds:.sodium chloride, acetaminophen, albuterol-ipratropium, benzocaine, dextrose **OR** dextrose **OR** dextrose **OR** glucagon (rDNA), diphenhydrAMINE, hydrALAZINE, labetalol, lidocaine 2% jelly, naloxone, ondansetron, oxyCODONE, phenol

## 2022-07-30 NOTE — Progress Notes (Signed)
[facility]  PROGRESS NOTE      Patient: Shelby Barron  Date: 07/30/2022   LOS: 32 Days  Admission Date: 06/28/2022   MRN: [id number]  Attending: Melina Fiddler, MD  When on service as the attending, please contact me on Epic Secure Chat from 7 AM - 7 PM for non-urgent issues. For urgent matters use XTend page from 7 AM - 7 PM.       ASSESSMENT/PLAN     DATRA SCHRAUBEN is a 82 y.o. female admitted with Bladder cancer    Toxic metabolic Encephalopathy/delirium resolving  - Patient was upgraded to ICU on 6/30 for lethargy and decreased responsiveness  - CT head was repeated on 6/29, no acute finding  - ABG consistent with respiratory alkalosis  - EEG consistent with encephalopathy no seizure, MRI no acute stroke  - CT abdomen with and without contrast after premedication did not show any leak or abscess  - SLP advanced diet to pureed.   -NG tube removed 7/9  -SLP following daily  - Palliative consulted:  Family is clear they would like no limits to care and patient should remain full code.     Anemia, 2/2 CKD and blood loss during OR  - TSAT 18%  - continue Aranesp per nephrology  - Trend CBC, transfuse Hgb<7. Status post 1U pRBC for Hgb 6.1 on 7/7 with improvement   -H&H has been stable around 8.5  No active signs of bleeding    s/p open cystectomy, bilateral nephrectomies, and resection of small bowel after intra-op enterotomy    Mixed shock - hemorrhagic vs septic: Resolved  Leukocytosis  ?  Necrotic wound tissue  -I reviewed the images from last wound care note on July 8 which showed some necrotic tissue    -I discussed with general surgery who agreed the patient needs debridement but needs to hold Eliquis for 2 days prior  Will hold Eliquis starting now and switch to heparin drip   Plan for bedside debridement by general surgery team today  Heparin held since a.m. resumed after debridement  I discussed with ID and plan for short course of antibiotic with debridement.  - 6/10 small bowel enteretomy with 1L  EBL. Went to the OR on June 12 for small bowel anastomosis, closure of mesenteric defect, closure of abdominal wall with wound VAC application. Wound VAC last changed on 7/11  - Surgery consulted, recommend CTAP with PO contrast that showed no anastomotic leak. Low concern for abscesses as they'd expect these to have a more loculated appearance in the interim between 6/17 and 7/1 imaging  - ID consulted, completed meropenem and Mycamine for 2 weeks on 7/3.    -Discussed with ID and they recommend to monitor off antibiotic for now      Acute CVA on the right frontal lobe most likely from hypotension and shock  History of CVA  Petechial hemorrhage vs cortical laminar necrosis on CTH  - Left upper extremity not moving at all unclear duration  - Neurology following, initially started on ASA 81mg  only due to concern for risk of hemorrhagic conversion. Now OK to switch to Eliquis for Afib and discontinue aspirin per neurology on 7/5 as > 10d post event.  - SBP goal 120-160  - Carotid Doppler showed plaque with no critical stenosis  - Head CT on 7/7 w/ a few areas of cortical hyperdensity - petechial hemorrhage vs cortical laminar necrosis. Discussed with neuro OK to continue eliquis for now.  Plan for repeat CTH today. Neuro exam stable.  - EEG encephalopathic pattern no seizure, repeat MRI no new infarction  - PT OT recommend SNF  -Repeat CT head on 07/26/2022 stable     Acute hypoxemic respiratory failure resolved  COPD  Extubated on 6/13, currently on room air  Maintain SaO2 > 92%  Continue as needed neb treatment  X-ray repeated on 6/29 showed resolution of interstitial edema     ESRD  S/p CRRT this admission, now on HD TTS  Continue dialysis per nephrology  IR removed the nonfunctional permacath and right Quinton IJ cath and replaced with a new permacath on 6/26.     HTN  On metoprolol   PRN IV labetalol for SBP > 180      New onset Afib with RVR, improved  - CHA2DS2-VASc score is 7  - Status post amiodarone drip. Now  stable on oral amiodarone and coreg per cardiology recommendations  - GI and general surgery cleared for anticoagulation. Neurology recommended to hold anticoagulation initiation for 10 days given the size of stroke.   -  started on Eliquis since 07/23/22  Hold Eliquis starting today 7/10 for 2 days prior to wound debridement     Renal cell carcinoma outpatient follow-up hematology oncology  Urothelial carcinoma  Thrombocytopenia  - VTE ppx: SCDs, eliquis     Diabetes Mellitus type 2  -Sliding scale insulin                        Nutrition: Pured diet as tolerated  Malnutrition Documentation    Moderate Malnutrition related to inadequate nutritional intake in the setting of acute illness as evidenced by <50% EER > 5 days, mild muscle losses (temporalis, deltoid) - new                 Patient has BMI=Body mass index is 36.13 kg/m.  Diagnosis: Obesity based on BMI criteria               Recent Labs   Lab 07/30/22  0317 07/29/22  0350 07/28/22  1710   Hemoglobin 7.1* 7.4* 7.7*   Hematocrit 22.1* 22.7* 23.2*   MCV 91.7 91.9 90.6   WBC 12.61* 10.85* 10.43*   Platelet Count 347* 365* 386*     Recent Labs   Lab 07/18/22  0421 06/02/22  0514   Ferritin 2,300.21*  --    Iron 18* 62   TIBC 99* 145*   Iron Saturation 18 43     Anemia Diagnosis: Already documented in note    Code Status: Full Code    Dispo: SNF    Family Contact: Daughter [name]     DVT Prophylaxis:   Current Facility-Administered Medications (Includes Only Anticoagulants, Misc. Hematological)   Medication Dose Route Last Admin    [Held by provider] apixaban (ELIQUIS) tablet 2.5 mg  2.5 mg Oral 2.5 mg at 07/28/22 0957    [Held by provider] heparin 25,000 units in dextrose 5% 500 mL infusion (premix)  11 Units/kg/hr Intravenous Stopped at 07/30/22 0459          CHART  REVIEW & DISCUSSION     The following chart items were reviewed as of 2:16 PM on 07/30/22:  [x]  Lab Results [x]  Imaging Results   [x]  Problem List  [x]  Current Orders [x]  Current Medications  []   Allergies  [x]  Code Status [x]  Previous Notes   []  SDoH    The management and plan of care  for this patient was discussed with the following specialty consultants:  []  Cardiology  []  Gastroenterology                 [x]  Infectious Disease  []  Pulmonology []  Neurology                [x]  Nephrology  []  Neurosurgery []  Orthopedic Surgery  []  Heme/Onc  [x]  General Surgery []  Psychiatry                                   []  Palliative    SUBJECTIVE     Shelby Barron reports she has abdominal pain no fever no chills no shortness of breath no chest pain no cough no headache no dizziness  MEDICATIONS     Current Facility-Administered Medications   Medication Dose Route Frequency    acetaminophen  650 mg Oral 4 times per day    Or    acetaminophen  650 mg Oral 4 times per day    amiodarone  200 mg per NG tube Daily    [Held by provider] apixaban  2.5 mg Oral Q12H SCH    atorvastatin  40 mg Oral QHS    carvedilol  12.5 mg Oral Q12H SCH    darbepoetin alfa  0.45 mcg/kg Subcutaneous Weekly    glycopyrrolate  0.2 mg Intravenous Q8H    insulin glargine  5 Units Subcutaneous QAM    insulin lispro  1-3 Units Subcutaneous QHS    insulin lispro  1-5 Units Subcutaneous TID AC    pantoprazole  40 mg per NG tube Q12H [facility]    thiamine  100 mg per NG tube Daily    umeclidinium-vilanterol  1 puff Inhalation QAM    vitamins/minerals  1 tablet Oral Daily       PHYSICAL EXAM     Vitals:    07/30/22 1112   BP: 122/67   Pulse: 71   Resp: 18   Temp: 98.6 F (37 C)   SpO2: 100%       Temperature: Temp  Min: 97.7 F (36.5 C)  Max: 99 F (37.2 C)  Pulse: Pulse  Min: 71  Max: 80  Respiratory: Resp  Min: 17  Max: 19  Non-Invasive BP: BP  Min: 117/67  Max: 153/69  Pulse Oximetry SpO2  Min: 94 %  Max: 100 %    Intake and Output Summary (Last 24 hours) at Date Time    Intake/Output Summary (Last 24 hours) at 07/30/2022 1416  Last data filed at 07/30/2022 1301  Gross per 24 hour   Intake 190 ml   Output --   Net 190 ml         GEN APPEARANCE: Alert,  awake and oriented x 1  HEENT: PERLA; EOMI; Conjunctiva Clear  NECK: Supple; No bruits  CVS: RRR, S1, S2; No M/G/R  LUNGS: CTAB; , no Rhonchi: No rales  ABD: Soft; No TTP; + Normoactive BS vertical abdominal wound and wound VAC in place  EXT: No edema; Pulses 2+ and intact  NEURO: moving right upper extremity spontaneously, able to move lower extremities for pain but minimal, no movement at all on the left upper extremity      LABS     Recent Labs   Lab 07/30/22  0317 07/29/22  0350 07/28/22  1710   WBC 12.61* 10.85* 10.43*   RBC 2.41* 2.47* 2.56*   Hemoglobin  7.1* 7.4* 7.7*   Hematocrit 22.1* 22.7* 23.2*   MCV 91.7 91.9 90.6   Platelet Count 347* 365* 386*       Recent Labs   Lab 07/30/22  0317 07/29/22  0350 07/28/22  0537 07/27/22  0338 07/26/22  0558   Sodium 137 142 143 138 138   Potassium 4.2 4.4 3.9 4.0 4.0   Chloride 100 103 103 99 99   CO2 26 25 25 23 23    BUN 31* 59* 41* 61* 45*   Creatinine 3.2* 5.3* 3.9* 4.7* 3.8*   Glucose 85 105* 81 182* 170*   Calcium 8.5 9.5 8.7 8.9 8.7   Magnesium 1.8 2.1 1.9 2.0 2.0                   Recent Labs   Lab 07/30/22  0317 07/29/22  2053 07/29/22  1200   PTT 46* 43* 50*       Microbiology Results (last 15 days)       Procedure Component Value Units Date/Time    Culture, Blood, Aerobic And Anaerobic [[id number]] Collected: 07/18/22 1400    Order Status: Completed Specimen: Blood, Venous Updated: 07/23/22 2200     Culture Blood No growth at 5 days    Culture, Blood, Aerobic And Anaerobic [[id number]] Collected: 07/18/22 1400    Order Status: Completed Specimen: Blood, Venous Updated: 07/23/22 2200     Culture Blood No growth at 5 days             RADIOLOGY     CT Head WO Contrast    Result Date: 07/26/2022   1.No significant change in multifocal areas of subacute infarction within bilateral cerebral hemispheres. 2.Small areas of petechial hemorrhage in the left parietal and occipital lobes are unchanged. No new hemorrhage or measurable parenchymal hematoma. 3.Persistent  complete opacification of the right mastoid air cells and middle ear cavity. Garnette Gunner, MD 07/26/2022 12:27 PM    CT Head WO Contrast    Result Date: 07/25/2022   1.Redemonstration of multifocal areas of bilateral acute infarctions. A few areas of cortical hyperdensity likely represent petechial hemorrhage. No mass occupying hematoma. No significant mass effect or midline shift. 2.Redemonstrated right mastoid and right middle ear cavity effusion with areas of bony thinning versus dehiscence of the right tegmen tympani and right tegmen mastoideum. Vara Guardian, MD 07/25/2022 2:19 PM    CT Abdomen Pelvis W IV And PO Cont    Result Date: 07/19/2022   1.Slight interval increase in left-sided retroperitoneal fluid collection with the other abdominal fluid collections appearing relatively stable. 2. There is no definite abscess or acute inflammatory process. 3. Incidental note is made of a small paraesophageal type hernia. 4. Sigmoid diverticulosis is noted without CT evidence of diverticulitis. 5. Other findings as noted above. Miguel Dibble, MD 07/19/2022 6:36 AM    MRI Brain WO Contrast    Result Date: 07/18/2022   Interval evolution of multifocal recent infarcts in the bilateral cerebral hemispheres. No new infarct. Milan Waunita Schooner, MD 07/18/2022 5:57 PM    XR Chest AP Portable    Result Date: 07/17/2022  1.Near-complete resolution of interstitial edema. 2.Improving pulmonary vascular congestion. Legrand Pitts, MD 07/17/2022 2:38 PM    CT Head WO Contrast    Result Date: 07/17/2022   1.No new acute intracranial pathology. 2.There are multiple regions of cytotoxic edema in the right > left frontal and left > right occipital lobes, consistent with known early subacute infarcts. Dannielle Burn  07/17/2022 8:26 AM    US Carotid Duplex Dopp Comp Bilateral    Result Date: 07/15/2022   1. Plaque formation in the right internal carotid artery bulb with elevated velocities. Stenosis is estimated at 50-69%. 2. Plaque in the left internal  carotid artery with normal velocities. Any stenosis is less than 50%. Criteria for stenosis and velocity parameters are based on the guidelines of the SRU (Society of Radiologists in Ultrasound). Grant et al: Carotid Artery Stenosis: Grayscale and Doppler Ultrasound Diagnosis-- Society of Radiologists in Ultrasound Consensus Conference Ultrasound Quarterly. Volume 19, Number 4; December, 2003. Recommendations updated October 2021 Verne Carrow is an [facility] accredited facility Suszanne Finch, MD 07/15/2022 9:41 AM    Tunneled Cath Removal (Permcath)    Result Date: 07/14/2022  1. Nonfunctioning tunneled dialysis catheter as described above. 2. Successful placement of a new 19 cm Equistream catheter as described above. Dimitrios Papadouris, MD 07/14/2022 4:06 PM    Tunneled Cath Placement (Permcath)    Result Date: 07/14/2022  1. Nonfunctioning tunneled dialysis catheter as described above. 2. Successful placement of a new 19 cm Equistream catheter as described above. Dimitrios Papadouris, MD 07/14/2022 4:06 PM    MRI Brain WO Contrast    Result Date: 07/13/2022   1.Patchy areas of acute/recent infarction involving the cerebral hemispheres bilaterally. The infarcts have a configuration suggesting watershed territory infarcts. 2.Chronic ischemic changes as discussed. Georgann Housekeeper, MD 07/13/2022 10:28 PM    CT Head WO Contrast    Result Date: 07/13/2022   1.New region of loss of gray-white matter differentiation in the right frontal lobe, concerning for acute infarction. 2.A few additional areas of poor gray-white matter differentiation for example in the left occipital lobe may be artifactual but could be correlated with MRI. 3.No evidence of acute intracranial hemorrhage or significant mass effect. These urgent results were discussed with and acknowledged by Dr. Don Broach on 07/13/2022 3:20 PM. Vara Guardian, MD 07/13/2022 3:21 PM    XR Chest AP Portable    Result Date: 07/10/2022  Interstitial markings have slightly increased, suggesting  minimal edema. No other change. Wilmon Pali, MD 07/10/2022 4:49 PM    XR Chest AP Portable    Result Date: 07/07/2022   There is mild interstitial prominence suggesting mild congestive heart failure/fluid overload in the setting of shortness of breath [name], MD 07/07/2022 12:32 PM    XR Chest AP Portable    Result Date: 07/06/2022   Hypoventilation with basilar atelectasis. Charlott Rakes, MD 07/06/2022 3:35 PM    CT Abdomen Pelvis W IV And PO Cont    Result Date: 07/06/2022  1.Postoperative changes from bilateral nephrectomy and cystectomy. 2.Small amount of ascites including some areas which appear loculated. Small collections of fluid and gas in the anterior midline abdomen may be postoperative but superimposed infection is not excluded. No rim-enhancing fluid collections or extravasation of oral contrast to suggest anastomotic leak. Demetrios Isaacs, MD 07/06/2022 5:51 AM    CT Head WO Contrast    Result Date: 07/06/2022   1.No acute intracranial abnormality is seen. Leandro Reasoner, MD 07/06/2022 3:56 AM    XR Abdomen AP    Result Date: 07/05/2022  Enteric tube terminates in the distal stomach. Elease Etienne, DO 07/05/2022 12:24 PM    XR Chest AP Portable    Result Date: 06/30/2022  1. Lines and tubes in adequate position. No pneumothorax. 2. Trace left pleural effusion and bibasilar atelectasis. Jasmine December D'Heureux, MD 06/30/2022 4:53 PM   Echo Results  None          Results for orders placed or performed during the hospital encounter of 06/28/22   MRI Brain WO Contrast    Narrative    HISTORY: Declining mental status, right gaze deviation.     COMPARISON: CT head 07/17/2022 and MRI brain 07/13/2022.     TECHNIQUE: MRI of the brain performed on a 1.5 Tesla scanner without  intravenous contrast.    CONTRAST: None.    FINDINGS:     Interval evolution of multifocal recent infarcts with associated edema in  the bilateral cerebral hemispheres. No new infarct. Mild chronic small  vessel ischemic changes within the cerebral white  matter. Mild generalized  cerebral volume loss.     No acute hemorrhage, extra-axial fluid collection, or midline shift. No  mass lesion or vasogenic edema.     Normal appearance of the sella. Cerebellar tonsils are appropriately  positioned. No acute hydrocephalus. Patent basal cisterns. Skull base flow  voids are intact.    No osseous abnormality. No acute orbital abnormality. Status post bilateral  lens replacement. No significant paranasal sinus mucosal disease. Severe  opacification of the right mastoid air cells and middle ear cavity. Mild  opacification of left mastoid air cells.       Impression         Interval evolution of multifocal recent infarcts in the bilateral cerebral  hemispheres. No new infarct.    Milan Waunita Schooner, MD  07/18/2022 5:57 PM   CT Head WO Contrast    Narrative    HISTORY: Petechial hemorrhage follow up.     COMPARISON: Multiple priors most recent CT 07/25/2022    TECHNIQUE: CT of the head performed without intravenous contrast. The  following dose reduction techniques were utilized: automated exposure  control and/or adjustment of the mA and/or KV according to patient size,  and the use of an iterative reconstruction technique.    CONTRAST: None.    FINDINGS:  Multifocal areas of subacute infarction within bilateral cerebral  hemispheres remain similar to prior. Small areas of petechial hemorrhage in  the left parietal and occipital lobes are unchanged. No new hemorrhage or  measurable parenchymal hematoma.    There is a background moderate chronic microangiopathic changes in the  periventricular white matter. Moderate generalized volume loss. No  extra-axial fluid collection or midline shift. Ventricles and basal  cisterns are patent.    Persistent complete opacification of the right mastoid air cells and middle  ear cavity. Paranasal sinuses are clear.      Impression       1.No significant change in multifocal areas of subacute infarction within  bilateral cerebral  hemispheres.  2.Small areas of petechial hemorrhage in the left parietal and occipital  lobes are unchanged. No new hemorrhage or measurable parenchymal hematoma.  3.Persistent complete opacification of the right mastoid air cells and  middle ear cavity.    Garnette Gunner, MD  07/26/2022 12:27 PM       Signed,  Melina Fiddler, MD  2:16 PM 07/30/2022

## 2022-07-30 NOTE — Plan of Care (Signed)
NURSING SHIFT NOTE     Patient: Shelby Barron  Day: 32      SHIFT EVENTS     Shift Narrative/Significant Events (PRN med administration, fall, RRT, etc.):     -Pt alert and unable to assess orientation on RA  -Incontinence and wound care done  -Some scheduled meds refused by pt  -Heparin drip restarted, called pharamacy, pharmacy stated to continue at previous rate and no need to draw labs  -Call bell within reach     Safety and fall precautions remain in place. Purposeful rounding completed.          ASSESSMENT     Changes in assessment from patient's baseline this shift:    Neuro: No  CV: No  Pulm: No  Peripheral Vascular: No  HEENT: No  GI: No  BM during shift: No, Last BM: Last BM Date: 07/29/22  GU: No   Integ: No  MS: No    Pain: No change  Pain Interventions: Medications  Medications Utilized: Scheduled tylenol    Mobility: PMP Activity: Step 3 - Bed Mobility of Distance Walked (ft) (Step 6,7): 0 Feet           Lines     Patient Lines/Drains/Airways Status       Active Lines, Drains and Airways       Name Placement date Placement time Site Days    Permacath Catheter - Tunneled 07/14/22 Subclavian vein catheter Right 07/14/22  1404  -- 16    Midline IV 07/01/22 Anterior;Left Upper Arm 07/01/22  1315  Upper Arm  29                         VITAL SIGNS     Vitals:    07/30/22 1946   BP: 144/64   Pulse: 68   Resp: 16   Temp: 98.4 F (36.9 C)   SpO2: 99%       Temp  Min: 98.4 F (36.9 C)  Max: 99 F (37.2 C)  Pulse  Min: 68  Max: 80  Resp  Min: 16  Max: 19  BP  Min: 117/67  Max: 153/69  SpO2  Min: 94 %  Max: 100 %      Intake/Output Summary (Last 24 hours) at 07/30/2022 2146  Last data filed at 07/30/2022 1700  Gross per 24 hour   Intake 290 ml   Output --   Net 290 ml                CARE PLAN        Problem: Moderate/High Fall Risk Score >5  Goal: Patient will remain free of falls  Outcome: Progressing  Flowsheets (Taken 07/29/2022 2200 by Koleen Distance, RN)  High (Greater than 13):   HIGH-Visual cue at  entrance to patient's room   HIGH-Bed alarm on at all times while patient in bed   HIGH-Apply yellow "Fall Risk" arm band   HIGH-Utilize chair pad alarm for patient while in the chair   HIGH-Pharmacy to initiate evaluation and intervention per protocol   HIGH-Initiate use of floor mats as appropriate   HIGH-Consider use of low bed     Problem: Compromised Sensory Perception  Goal: Sensory Perception Interventions  Outcome: Progressing  Flowsheets (Taken 07/30/2022 0800)  Sensory Perception Interventions: Offload heels, Pad bony prominences, Reposition q 2hrs/turn Clock, Q2 hour skin assessment under devices if present     Problem: Compromised Moisture  Goal: Moisture  level Interventions  Outcome: Progressing  Flowsheets (Taken 07/30/2022 0800)  Moisture level Interventions: Moisture wicking products, Moisture barrier cream     Problem: Compromised Activity/Mobility  Goal: Activity/Mobility Interventions  Outcome: Progressing  Flowsheets (Taken 07/30/2022 0800)  Activity/Mobility Interventions: Pad bony prominences, TAP Seated positioning system when OOB, Promote PMP, Reposition q 2 hrs / turn clock, Offload heels     Problem: Compromised Nutrition  Goal: Nutrition Interventions  Outcome: Progressing  Flowsheets (Taken 07/30/2022 0800)  Nutrition Interventions: Discuss nutrition at Rounds, I&Os, Document % meal eaten, Daily weights     Problem: Compromised Friction/Shear  Goal: Friction and Shear Interventions  Outcome: Progressing  Flowsheets (Taken 07/30/2022 0800)  Friction and Shear Interventions: Pad bony prominences, Off load heels, HOB 30 degrees or less unless contraindicated, Consider: TAP seated positioning, Heel foams     Problem: Inadequate Airway Clearance  Goal: Patent Airway maintained  Outcome: Progressing  Flowsheets (Taken 07/29/2022 1755 by Lorriane Shire, RN)  Patent airway maintained:   Position patient for maximum ventilatory efficiency   Provide adequate fluid intake to liquefy secretions   Suction  secretions as needed   Reinforce use of ordered respiratory interventions (i.e. CPAP, BiPAP, Incentive Spirometer, Acapella, etc.)   Reposition patient every 2 hours and as needed unless able to self-reposition  Goal: Normal respiratory rate/effort achieved/maintained  Outcome: Progressing  Flowsheets (Taken 07/24/2022 1549 by Peagler, Alene, RN)  Normal respiratory rate/effort achieved/maintained: Plan activities to conserve energy: plan rest periods     Problem: Infection Prevention  Goal: Free from infection  Outcome: Progressing  Flowsheets (Taken 07/29/2022 0019 by Kathrynn Ducking, RN)  Free from infection:   Monitor/assess vital signs   Assess incision for evidence of healing   Assess for signs and symptoms of infection     Problem: Impaired Mobility  Goal: Mobility/Activity is maintained at optimal level for patient  Outcome: Progressing  Flowsheets (Taken 07/29/2022 1755 by Lorriane Shire, RN)  Mobility/activity is maintained at optimal level for patient:   Consult/collaborate with Physical Therapy and/or Occupational Therapy   Encourage independent activity per ability     Problem: Constipation  Goal: Fluid and electrolyte balance are achieved/maintained  Outcome: Progressing  Flowsheets (Taken 07/29/2022 1755 by Lorriane Shire, RN)  Fluid and electrolyte balance are achieved/maintained:   Monitor/assess lab values and report abnormal values   Assess and reassess fluid and electrolyte status   Monitor for muscle weakness   Observe for cardiac arrhythmias  Goal: Elimination patterns are normal or improving  Outcome: Progressing     Problem: Safety  Goal: Patient will be free from injury during hospitalization  Outcome: Progressing  Flowsheets (Taken 07/29/2022 1755 by Lorriane Shire, RN)  Patient will be free from injury during hospitalization:   Assess patient's risk for falls and implement fall prevention plan of care per policy   Provide and maintain safe environment   Use appropriate transfer methods   Ensure  appropriate safety devices are available at the bedside   Include patient/ family/ care giver in decisions related to safety   Assess for patients risk for elopement and implement Elopement Risk Plan per policy   Provide alternative method of communication if needed (communication boards, writing)   Hourly rounding  Goal: Patient will be free from infection during hospitalization  Outcome: Progressing  Flowsheets (Taken 07/29/2022 1755 by Lorriane Shire, RN)  Free from Infection during hospitalization:   Assess and monitor for signs and symptoms of infection   Monitor all insertion sites (i.e. indwelling lines,  tubes, urinary catheters, and drains)   Encourage patient and family to use good hand hygiene technique   Monitor lab/diagnostic results     Problem: Pain  Goal: Pain at adequate level as identified by patient  Outcome: Progressing  Flowsheets (Taken 07/29/2022 1755 by Lorriane Shire, RN)  Pain at adequate level as identified by patient:   Identify patient comfort function goal   Assess for risk of opioid induced respiratory depression, including snoring/sleep apnea. Alert healthcare team of risk factors identified.   Assess pain on admission, during daily assessment and/or before any "as needed" intervention(s)   Reassess pain within 30-60 minutes of any procedure/intervention, per Pain Assessment, Intervention, Reassessment (AIR) Cycle   Evaluate if patient comfort function goal is met   Evaluate patient's satisfaction with pain management progress   Offer non-pharmacological pain management interventions   Consult/collaborate with Pain Service   Consult/collaborate with Physical Therapy, Occupational Therapy, and/or Speech Therapy   Include patient/patient care companion in decisions related to pain management as needed     Problem: Side Effects from Pain Analgesia  Goal: Patient will experience minimal side effects of analgesic therapy  Outcome: Progressing  Flowsheets (Taken 07/29/2022 1755 by Lorriane Shire,  RN)  Patient will experience minimal side effects of analgesic therapy:   Monitor/assess patient's respiratory status (RR depth, effort, breath sounds)   Assess for changes in cognitive function   Prevent/manage side effects per LIP orders (i.e. nausea, vomiting, pruritus, constipation, urinary retention, etc.)   Evaluate for opioid-induced sedation with appropriate assessment tool (i.e. POSS)     Problem: Hemodynamic Status: Cardiac  Goal: Stable vital signs and fluid balance  Outcome: Progressing  Flowsheets (Taken 07/29/2022 1755 by Lorriane Shire, RN)  Stable vital signs and fluid balance:   Assess signs and symptoms associated with cardiac rhythm changes   Monitor lab values     Problem: Renal Instability  Goal: Fluid and electrolyte balance are achieved/maintained  Outcome: Progressing  Flowsheets (Taken 07/29/2022 1755 by Lorriane Shire, RN)  Fluid and electrolyte balance are achieved/maintained:   Monitor/assess lab values and report abnormal values   Monitor for muscle weakness   Assess and reassess fluid and electrolyte status   Follow fluid restrictions/IV/PO parameters   Observe for cardiac arrhythmias     Problem: Patient Receiving Advanced Renal Therapies  Goal: Therapy access site remains intact  Outcome: Progressing     Problem: Fluid and Electrolyte Imbalance/ Endocrine  Goal: Fluid and electrolyte balance are achieved/maintained  Outcome: Progressing  Flowsheets (Taken 07/29/2022 1755 by Lorriane Shire, RN)  Fluid and electrolyte balance are achieved/maintained:   Monitor/assess lab values and report abnormal values   Assess and reassess fluid and electrolyte status   Monitor for muscle weakness   Observe for cardiac arrhythmias  Goal: Adequate hydration  Outcome: Progressing  Flowsheets (Taken 07/29/2022 1755 by Lorriane Shire, RN)  Adequate hydration:   Assess mucus membranes, skin color, turgor, perfusion and presence of edema   Assess for peripheral, sacral, periorbital and abdominal edema   Monitor and  assess vital signs and perfusion     Problem: Diabetes: Glucose Imbalance  Goal: Blood glucose stable at established goal  Outcome: Progressing  Flowsheets (Taken 07/29/2022 1755 by Lorriane Shire, RN)  Blood glucose stable at established goal:   Assess for hypoglycemia /hyperglycemia   Include patient/family in decisions related to nutrition/dietary selections   Follow fluid restrictions/IV/PO parameters   Monitor lab values   Monitor intake and output.  Notify LIP if urine output  is < 30 mL/hour.   Monitor/assess vital signs   Coordinate medication administration with meals, as indicated   Ensure appropriate diet and assess tolerance   Ensure appropriate consults are obtained (Nutrition, Diabetes Education, and Case Management)   Ensure patient/family has adequate teaching materials   Ensure adequate hydration     Problem: Inadequate Gas Exchange  Goal: Patent Airway maintained  Outcome: Progressing  Flowsheets (Taken 07/29/2022 1755 by Lorriane Shire, RN)  Patent airway maintained:   Position patient for maximum ventilatory efficiency   Provide adequate fluid intake to liquefy secretions   Suction secretions as needed   Reinforce use of ordered respiratory interventions (i.e. CPAP, BiPAP, Incentive Spirometer, Acapella, etc.)   Reposition patient every 2 hours and as needed unless able to self-reposition  Goal: Adequate oxygenation and improved ventilation  Outcome: Progressing  Flowsheets (Taken 07/29/2022 1755 by Lorriane Shire, RN)  Adequate oxygenation and improved ventilation:   Assess lung sounds   Monitor SpO2 and treat as needed   Provide mechanical and oxygen support to facilitate gas exchange   Monitor and treat ETCO2   Teach/reinforce use of incentive spirometer 10 times per hour while awake, cough and deep breath as needed   Position for maximum ventilatory efficiency   Plan activities to conserve energy: plan rest periods   Consult/collaborate with Respiratory Therapy   Increase activity as  tolerated/progressive mobility     Problem: Neurological Deficit  Goal: Neurological status is stable or improving  Outcome: Progressing  Flowsheets (Taken 07/29/2022 1755 by Lorriane Shire, RN)  Neurological status is stable or improving:   Monitor/assess/document neurological assessment (Stroke: every 4 hours)   Monitor/assess NIH Stroke Scale   Observe for seizure activity and initiate seizure precautions if indicated   Perform CAM Assessment   Re-assess NIH Stroke Scale for any change in status     Problem: Potential for Aspiration  Goal: Risk of aspiration will be minimized  Outcome: Progressing     Problem: Every Day - Stroke  Goal: Mobility/Activity is maintained at optimal level for patient  Outcome: Progressing  Flowsheets (Taken 07/29/2022 1755 by Lorriane Shire, RN)  Mobility/activity is maintained at optimal level for patient:   Consult/collaborate with Physical Therapy and/or Occupational Therapy   Encourage independent activity per ability  Goal: Elimination patterns are normal or improving  Outcome: Progressing  Goal: Neurological status is stable or improving  Outcome: Progressing  Flowsheets (Taken 07/29/2022 1755 by Lorriane Shire, RN)  Neurological status is stable or improving:   Monitor/assess/document neurological assessment (Stroke: every 4 hours)   Monitor/assess NIH Stroke Scale   Observe for seizure activity and initiate seizure precautions if indicated   Perform CAM Assessment   Re-assess NIH Stroke Scale for any change in status  Goal: Core/Quality measure requirements - Daily  Outcome: Progressing  Goal: Stable vital signs and fluid balance  Outcome: Progressing  Goal: Patient will maintain adequate oxygenation  Outcome: Progressing  Goal: Patient's risk of aspiration will be minimized  Outcome: Progressing  Goal: Nutritional intake is adequate  Outcome: Progressing  Goal: Skin integrity is maintained or improved  Outcome: Progressing  Goal: Neurovascular status is stable or improving  Outcome:  Progressing  Goal: Effective coping demonstrated  Outcome: Progressing  Goal: Will be able to express needs and understand communication  Outcome: Progressing     Problem: Day of Discharge - Stroke  Goal: Able to express understanding of discharge instructions and stroke education  Outcome: Progressing  Goal: Core/Quality measures - Discharge  Outcome: Progressing     Problem: Pain interferes with ability to perform ADL  Goal: Pain at adequate level as identified by patient  Outcome: Progressing  Flowsheets (Taken 07/29/2022 1755 by Lorriane Shire, RN)  Pain at adequate level as identified by patient:   Identify patient comfort function goal   Assess for risk of opioid induced respiratory depression, including snoring/sleep apnea. Alert healthcare team of risk factors identified.   Assess pain on admission, during daily assessment and/or before any "as needed" intervention(s)   Reassess pain within 30-60 minutes of any procedure/intervention, per Pain Assessment, Intervention, Reassessment (AIR) Cycle   Evaluate if patient comfort function goal is met   Evaluate patient's satisfaction with pain management progress   Offer non-pharmacological pain management interventions   Consult/collaborate with Pain Service   Consult/collaborate with Physical Therapy, Occupational Therapy, and/or Speech Therapy   Include patient/patient care companion in decisions related to pain management as needed     Problem: Everyday - Heart Failure  Goal: Mobility/Activity is Maintained at Optimal Level for Patient  Outcome: Progressing  Flowsheets (Taken 07/29/2022 1755 by Lorriane Shire, RN)  Mobility/Activity is Maintained at Optimal Level for Patient:   Perform active/passive ROM   Reposition patient every 2 hours and as needed unless able to reposition self   Increase mobility as tolerated/progressive mobility protocol   Maintain SCD's as Ordered   Consult with Physical Therapy and/or Occupational Therapy   Assess for changes in respiratory  status, level of consciousness and/or development of fatigue  Goal: Nutritional Intake is Adequate  Outcome: Progressing  Flowsheets (Taken 07/29/2022 1755 by Lorriane Shire, RN)  Nutritional Intake is Adequate:   Cardiac diet-2 gm Sodium   Consult/Collaborate with Nutritionist   Assess appetite,anorexia and amount of meal/food tolerated   Encourage/perform oral hygiene as appropriate   Patient and family teaching on low sodium diet   Fluid Restricction if needed  4 eyes in 4 hours pressure injury assessment note:      Completed with:   Unit & Time admitted:              Bony Prominences: Check appropriate box; if wound is present enter wound assessment in LDA     Occiput:                 [x] WNL  []  Wound present  Face:                     [x] WNL  []  Wound present  Ears:                      [x] WNL  []  Wound present  Spine:                    [x] WNL  []  Wound present  Shoulders:             [x] WNL  []  Wound present  Elbows:                  [x] WNL  []  Wound present  Sacrum/coccyx:     [] WNL  [x]  Wound present  Ischial Tuberosity:  [] WNL  [x]  Wound present  Trochanter/Hip:      [x] WNL  []  Wound present  Knees:                   [x] WNL  []  Wound present  Ankles:                   [  x]WNL  []  Wound present  Heels:                    [x] WNL  []  Wound present  Other pressure areas:  []  Wound location       Device related: []  Device name:         LDA completed if wound present: yes/no  Consult WOCN if necessary    Other skin related issues, ie tears, rash, etc, document in Integumentary flowsheet

## 2022-07-30 NOTE — Nursing Progress Note (Signed)
4 eyes in 4 hours pressure injury assessment note:      Completed with: Tyvella, tech  Unit & Time admitted:              Bony Prominences: Check appropriate box; if wound is present enter wound assessment in LDA     Occiput:                 [x] WNL  []  Wound present  Face:                     [x] WNL  []  Wound present  Ears:                      [x] WNL  []  Wound present  Spine:                    [x] WNL  []  Wound present  Shoulders:             [x] WNL  []  Wound present  Elbows:                  [x] WNL  []  Wound present  Sacrum/coccyx:     [] WNL  [x]  Wound present  Ischial Tuberosity:  [] WNL  [x]  Wound present  Trochanter/Hip:      [x] WNL  []  Wound present  Knees:                   [x] WNL  []  Wound present  Ankles:                   [x] WNL  []  Wound present  Heels:                    [x] WNL  []  Wound present  Other pressure areas:  []  Wound location       Device related: []  Device name:         LDA completed if wound present: yes  Consult WOCN if necessary    Other skin related issues, ie tears, rash, etc, document in Integumentary flowsheet

## 2022-07-30 NOTE — Progress Notes (Incomplete)
[facility]  Progress Note    07/30/2022   Shelby Barron [id number]:[id number] is a 82 y.o. female,       Assessment:     Encephalopathy  Acute CVA  History of bladder cancer and renal failure  Status post radical cystectomy/bilateral nephrectomy, enterectomy and lysis of adhesion (06/28/2022)  S/p small bowel resection  No fever or leukocytosis  End-stage renal disease on hemodialysis  Atrial fibrillation  Risk of aspiration    Plan:     Scheduled for wound debridement tomorrow  Start meropenem in preparation for debridement tomorrow morning  Continue local wound care  Follow-up chest x-ray  Continue probiotics  Monitor electrolytes and renal functions closely  Monitor clinically  Discussed with Dr.Al Najafi    ROS:     General: No fever, awake and alert, confused  HEENT: NG tube removed  Respiratory: No cough or shortness of breath  Gastrointestinal: Abdominal wall wound  Genito-Urinary: no hematuria  Musculoskeletal: no edema  Neurological: awake  Dermatological: No rash    Physical Examination:     Blood pressure 117/67, pulse 75, temperature 98.6 F (37 C), temperature source Oral, resp. rate 19, height 1.575 m (5\' 2" ), weight 89.6 kg (197 lb 8.5 oz), SpO2 100%.    General Appearance: Appears comfortable  HEENT: Pupils are equal, round, and reactive to light.   Lungs: Clear to auscultation  Heart: Regular rate and rhythm  Chest: Symmetric chest wall expansion.   Abdomen: soft, no hepatosplenomegaly, no bowel sounds, abdominal wall wound stable  Neurological: awake  Extremities: No edema    Laboratory And Diagnostic Studies:     Recent Labs     07/30/22  0317 07/29/22  0350   WBC 12.61* 10.85*   Hemoglobin 7.1* 7.4*   Hematocrit 22.1* 22.7*   Platelet Count 347* 365*     Recent Labs     07/30/22  0317 07/29/22  0350   Sodium 137 142   Potassium 4.2 4.4   Chloride 100 103   CO2 26 25   BUN 31* 59*   Creatinine 3.2* 5.3*   Glucose 85 105*   Calcium 8.5 9.5     No results for  input(s): "AST", "ALT", "ALKPHOS", "PROT", "ALB" in the last 72 hours.      Current Meds:      Scheduled Meds: PRN Meds:    acetaminophen, 650 mg, Oral, 4 times per day   Or  acetaminophen, 650 mg, Oral, 4 times per day  amiodarone, 200 mg, per NG tube, Daily  [Held by provider] apixaban, 2.5 mg, Oral, Q12H SCH  carvedilol, 12.5 mg, Oral, Q12H SCH  darbepoetin alfa, 0.45 mcg/kg, Subcutaneous, Weekly  glycopyrrolate, 0.2 mg, Intravenous, Q8H  insulin glargine, 5 Units, Subcutaneous, QAM  insulin lispro, 1-8 Units, Subcutaneous, Q6H  pantoprazole, 40 mg, per NG tube, Q12H SCH  prenatal vitamin, 1 tablet, Oral, Daily  thiamine, 100 mg, per NG tube, Daily        Continuous Infusions:   [Held by provider] heparin infusion 25,000 units/500 mL (Cardiac/Low Intensity) Stopped (07/30/22 0459)      sodium chloride, , PRN  acetaminophen, 650 mg, Q6H PRN  albuterol-ipratropium, 3 mL, Q6H PRN  benzocaine, 1 spray, TID PRN  dextrose, 15 g of glucose, PRN   Or  dextrose, 12.5 g, PRN   Or  dextrose, 12.5 g, PRN   Or  glucagon (rDNA), 1 mg, PRN  diphenhydrAMINE, 25 mg, Q6H PRN  hydrALAZINE, 10 mg, Q3H PRN  labetalol, 10 mg, Q15 Min PRN  lidocaine 2% jelly, , Once PRN  naloxone, 0.2 mg, PRN  ondansetron, 4 mg, Q6H PRN  oxyCODONE, 2.5 mg, Q4H PRN  phenol, 1 spray, Q2H PRN          Edger Husain A. Janalyn Rouse, M.D.  07/30/2022  9:20 AM

## 2022-07-30 NOTE — Plan of Care (Signed)
Surgery Plan of Care:    Will plan for debridement 7/13, discussed with primary team. May resume heparin and hold at 0500.     Susy Manor, MD  General Surgery, PGY-3

## 2022-07-30 NOTE — Plan of Care (Signed)
NURSING SHIFT NOTE     Patient: Shelby Barron  Day: 32      SHIFT EVENTS     Shift Narrative/Significant Events (PRN med administration, fall, RRT, etc.):     Patient A&O x 1 (self). VSS on room air. Q2 repositioning. Scheduled meds given, tolerated well.    Bed locked in lowest position. Call light within reach. Safety measures in place. Purposeful rounding completed.    Continue plan of care for Q6 AccuChecks, Heparin drip, incontinence care, PT/OT/Surgery/ID/Neuro/ consults, wound debridement at bedside.    Discharge TBD, SNF.       ASSESSMENT     Changes in assessment from patient's baseline this shift:    Neuro: No  CV: No  Pulm: No  Peripheral Vascular: No  HEENT: No  GI: No  BM during shift: No, Last BM Date: 07/29/22  GU: No   Integ: No  MS: No    Pain: None    Mobility: PMP Activity: Step 3 - Bed Mobility of Distance Walked (ft) (Step 6,7): 0 Feet         Lines     Patient Lines/Drains/Airways Status       Active Lines, Drains and Airways       Name Placement date Placement time Site Days    Permacath Catheter - Tunneled 07/14/22 Subclavian vein catheter Right 07/14/22  1404  -- 15    Peripheral IV 07/25/22 20 G Right Antecubital 07/25/22  1700  Antecubital  4    Peripheral IV 07/25/22 22 G Anterior;Left Forearm 07/25/22  1700  Forearm  4    Midline IV 07/01/22 Anterior;Left Upper Arm 07/01/22  1315  Upper Arm  28                       VITAL SIGNS     Vitals:    07/29/22 2313   BP: 153/69   Pulse: 76   Resp: 18   Temp: 99 F (37.2 C)   SpO2: 100%       Temp  Min: 97.7 F (36.5 C)  Max: 99.5 F (37.5 C)  Pulse  Min: 72  Max: 92  Resp  Min: 17  Max: 19  BP  Min: 102/55  Max: 171/78  SpO2  Min: 95 %  Max: 100 %      Intake/Output Summary (Last 24 hours) at 07/30/2022 0004  Last data filed at 07/29/2022 1931  Gross per 24 hour   Intake 60 ml   Output 1901 ml   Net -1841 ml          CARE PLAN        Problem: Moderate/High Fall Risk Score >5  Goal: Patient will remain free of falls  Outcome:  Progressing  Flowsheets (Taken 07/29/2022 2200)  High (Greater than 13):   HIGH-Visual cue at entrance to patient's room   HIGH-Bed alarm on at all times while patient in bed   HIGH-Apply yellow "Fall Risk" arm band   HIGH-Utilize chair pad alarm for patient while in the chair   HIGH-Pharmacy to initiate evaluation and intervention per protocol   HIGH-Initiate use of floor mats as appropriate   HIGH-Consider use of low bed

## 2022-07-31 LAB — PHOSPHORUS: Phosphorus: 3.8 mg/dL (ref 2.3–4.7)

## 2022-07-31 LAB — HEPATITIS B (HBV) SURFACE ANTIGEN WITH REFLEX TO CONFIRMATION: Hepatitis B Surface Antigen: NONREACTIVE

## 2022-07-31 LAB — APTT: PTT: 52 s — ABNORMAL HIGH (ref 27–39)

## 2022-07-31 LAB — CBC
Absolute nRBC: 0 10*3/uL (ref <=0.00)
Absolute nRBC: 0 10*3/uL (ref <=0.00)
Hematocrit: 21.4 % — ABNORMAL LOW (ref 34.7–43.7)
Hematocrit: 22.5 % — ABNORMAL LOW (ref 34.7–43.7)
Hemoglobin: 6.8 g/dL — ABNORMAL LOW (ref 11.4–14.8)
Hemoglobin: 7.2 g/dL — ABNORMAL LOW (ref 11.4–14.8)
MCH: 29.2 pg (ref 25.1–33.5)
MCH: 29.3 pg (ref 25.1–33.5)
MCHC: 31.8 g/dL (ref 31.5–35.8)
MCHC: 32 g/dL (ref 31.5–35.8)
MCV: 91.8 fL (ref 78.0–96.0)
MCV: 91.5 fL (ref 78.0–96.0)
MPV: 9.6 fL (ref 8.9–12.5)
MPV: 9.8 fL (ref 8.9–12.5)
Platelet Count: 333 10*3/uL (ref 142–346)
Platelet Count: 345 10*3/uL (ref 142–346)
RBC: 2.33 10*6/uL — ABNORMAL LOW (ref 3.90–5.10)
RBC: 2.46 10*6/uL — ABNORMAL LOW (ref 3.90–5.10)
RDW: 16 % — ABNORMAL HIGH (ref 11–15)
RDW: 16 % — ABNORMAL HIGH (ref 11–15)
WBC: 11.63 10*3/uL — ABNORMAL HIGH (ref 3.10–9.50)
WBC: 11.91 10*3/uL — ABNORMAL HIGH (ref 3.10–9.50)
nRBC %: 0 /100 WBC (ref <=0.0)
nRBC %: 0 /100 WBC (ref <=0.0)

## 2022-07-31 LAB — BASIC METABOLIC PANEL
Anion Gap: 13 (ref 5.0–15.0)
BUN: 43 mg/dL — ABNORMAL HIGH (ref 7–21)
CO2: 24 mEq/L (ref 17–29)
Calcium: 8.6 mg/dL (ref 7.9–10.2)
Chloride: 99 mEq/L (ref 99–111)
Creatinine: 4.5 mg/dL — ABNORMAL HIGH (ref 0.4–1.0)
GFR: 9.1 mL/min/{1.73_m2} — ABNORMAL LOW (ref >=60.0)
Glucose: 82 mg/dL (ref 70–100)
Potassium: 4.7 mEq/L (ref 3.5–5.3)
Sodium: 136 mEq/L (ref 135–145)

## 2022-07-31 LAB — WHOLE BLOOD GLUCOSE POCT
Whole Blood Glucose POCT: 88 mg/dL (ref 70–100)
Whole Blood Glucose POCT: 98 mg/dL (ref 70–100)
Whole Blood Glucose POCT: 87 mg/dL (ref 70–100)
Whole Blood Glucose POCT: 98 mg/dL (ref 70–100)

## 2022-07-31 LAB — TYPE AND SCREEN
ABO Rh: O POS
Antibody Screen: NEGATIVE

## 2022-07-31 LAB — MAGNESIUM: Magnesium: 1.9 mg/dL (ref 1.6–2.6)

## 2022-07-31 LAB — ANTI-XA,UFH: Anti-Xa, UFH: 0.44 IU/mL

## 2022-07-31 MED ORDER — SODIUM CHLORIDE 0.9 % IV BOLUS
250.0000 mL | INTRAVENOUS | Status: AC | PRN
Start: 2022-07-31 — End: 2022-07-31

## 2022-07-31 MED ORDER — SODIUM CHLORIDE 0.9 % IV SOLN
INTRAVENOUS | Status: DC | PRN
Start: 2022-07-31 — End: 2022-08-18

## 2022-07-31 MED ORDER — SODIUM CHLORIDE 0.9 % IV MBP
500.0000 mg | Freq: Two times a day (BID) | INTRAVENOUS | Status: DC
Start: 2022-07-31 — End: 2022-07-31
  Filled 2022-07-31: qty 0.5

## 2022-07-31 MED ORDER — SODIUM CHLORIDE 0.9 % IV BOLUS
100.0000 mL | INTRAVENOUS | Status: AC | PRN
Start: 2022-07-31 — End: 2022-07-31

## 2022-07-31 MED ORDER — LIDOCAINE HCL 1 % IJ SOLN
20.0000 mL | Freq: Once | INTRAMUSCULAR | Status: DC
Start: 2022-07-31 — End: 2022-08-02

## 2022-07-31 MED ORDER — SODIUM CHLORIDE 0.9 % IV MBP
500.0000 mg | INTRAVENOUS | Status: DC
Start: 2022-07-31 — End: 2022-08-02
  Administered 2022-07-31 – 2022-08-01 (×2): 500 mg via INTRAVENOUS
  Filled 2022-07-31: qty 0.5

## 2022-07-31 NOTE — Plan of Care (Signed)
Problem: Renal Instability  Goal: Fluid and electrolyte balance are achieved/maintained  Flowsheets (Taken 07/29/2022 1755 by Lorriane Shire, RN)  Fluid and electrolyte balance are achieved/maintained:   Monitor/assess lab values and report abnormal values   Monitor for muscle weakness   Assess and reassess fluid and electrolyte status   Follow fluid restrictions/IV/PO parameters   Observe for cardiac arrhythmias     Problem: Patient Receiving Advanced Renal Therapies  Goal: Therapy access site remains intact  Flowsheets (Taken 07/29/2022 1100 by Bunnie Philips, RN)  Therapy access site remains intact:   Assess therapy access site   Change therapy access site dressing as needed

## 2022-07-31 NOTE — Progress Notes (Signed)
[facility] PROGRESS NOTE  Myrla Halsted, x [phone] 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 s/p bilateral radical nephrectomies and  cystectomy on 06/28/22  - complicated by small bowel enterotomy and blood loss   New onset afib - on amiodarone and eliquis  - eliquis on hold  High grade urothelial bladder CA: s/p bilateral nephrectomies and cystectomy c/b mall bowel enterotomy and blood loss (~1L) on 06/28/22  - eliquis and heparin gtt on hold; planned for debridement today  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 - remains low, adequate iron - on aranesp  Encephalopathy - same  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      Review of Systems:   Unable to obtain due to AMS    Physical Exam:     Vitals:    07/31/22 1115 07/31/22 1126 07/31/22 1130 07/31/22 1145   BP: 152/74 185/78 185/78 144/70   Pulse: 78 84 84 85   Resp:  21     Temp:  98.3 F (36.8 C)  98.3 F (36.8 C)   TempSrc:       SpO2:       Weight:        Height:           Intake and Output Summary (Last 24 hours) at Date Time    Intake/Output Summary (Last 24 hours) at 07/31/2022 1154  Last data filed at 07/31/2022 1000  Gross per 24 hour   Intake 350 ml   Output 0 ml   Net 350 ml         General: awake, in no distress  Cardiovascular: regular rate and rhythm  Lungs: bilateral air entry  Abdomen: soft  Extremities: no edema  Access: R permacath     Meds:      Scheduled Meds: PRN Meds:    acetaminophen, 650 mg, Oral, 4 times per day   Or  acetaminophen, 650 mg, Oral, 4 times per day  amiodarone, 200 mg, per NG tube, Daily  apixaban, 2.5 mg, Oral, Q12H SCH  atorvastatin, 40 mg, Oral, QHS  carvedilol, 12.5 mg, Oral, Q12H SCH  darbepoetin alfa, 0.45 mcg/kg, Subcutaneous, Weekly  glycopyrrolate, 0.2 mg, Intravenous, Q8H  insulin glargine, 5 Units, Subcutaneous, QAM  insulin lispro, 1-3 Units, Subcutaneous, QHS  insulin lispro, 1-5 Units,  Subcutaneous, TID AC  meropenem, 500 mg, Intravenous, Q24H  pantoprazole, 40 mg, per NG tube, Q12H SCH  thiamine, 100 mg, per NG tube, Daily  umeclidinium-vilanterol, 1 puff, Inhalation, QAM  vitamins/minerals, 1 tablet, Oral, Daily          Continuous Infusions:   [Held by provider] heparin infusion 25,000 units/500 mL (Cardiac/Low Intensity) Stopped (07/31/22 0529)        sodium chloride, , PRN  acetaminophen, 650 mg, Q6H PRN  albuterol-ipratropium, 3 mL, Q6H PRN  benzocaine, 1 spray, TID PRN  dextrose, 15 g of glucose, PRN   Or  dextrose, 12.5 g, PRN   Or  dextrose, 12.5 g, PRN   Or  glucagon (rDNA), 1 mg, PRN  diphenhydrAMINE, 25 mg, Q6H PRN  hydrALAZINE, 10 mg, Q3H PRN  labetalol, 10 mg, Q15 Min PRN  lidocaine 2% jelly, , Once PRN  naloxone, 0.2 mg, PRN  ondansetron, 4 mg, Q6H PRN  oxyCODONE, 2.5 mg, Q4H PRN  phenol, 1 spray, Q2H PRN  sodium chloride, 100 mL, Q1H PRN  sodium chloride, 250 mL, PRN              Labs:     Recent Labs   Lab 07/31/22  0754 07/31/22  0449 07/30/22  0317   WBC 11.91* 11.63* 12.61*   Hemoglobin 7.2* 6.8*  7.1*   Hematocrit 22.5* 21.4* 22.1*   Platelet Count 345 333 347*     Recent Labs   Lab 07/31/22  0449 07/30/22  0317 07/29/22  0350   Sodium 136 137 142   Potassium 4.7 4.2 4.4   Chloride 99 100 103   CO2 24 26 25    BUN 43* 31* 59*   Creatinine 4.5* 3.2* 5.3*   Calcium 8.6 8.5 9.5   Phosphorus 3.8 2.7 3.3   Magnesium 1.9 1.8 2.1   Glucose 82 85 105*   GFR 9.1* 13.7* 7.5*           Signed by: Joetta Manners, MD

## 2022-07-31 NOTE — Plan of Care (Addendum)
NURSING SHIFT NOTE     Patient: Shelby Barron  Day: 33      SHIFT EVENTS     Shift Narrative/Significant Events (PRN med administration, fall, RRT, etc.):     -Pt a&ox1 to self on RA  -Incontinence and wound care done  -Scheduled meds given and tolerated by pt  -Dialysis and blood transfusion done by dialysis RN  -Neuro checks done  -Pt family stated they would not like pt to have surgery at bedside on a day of dialysis   -Call bell within reach     Safety and fall precautions remain in place. Purposeful rounding completed.          ASSESSMENT     Changes in assessment from patient's baseline this shift:    Neuro: No  CV: No  Pulm: No  Peripheral Vascular: No  HEENT: No  GI: No  BM during shift: No, Last BM: Last BM Date: 07/29/22  GU: No   Integ: No  MS: No    Pain: Improved  Pain Interventions: Medications  Medications Utilized: Scheduled acetaminophen    Mobility: PMP Activity: Step 3 - Bed Mobility of Distance Walked (ft) (Step 6,7): 0 Feet           Lines     Patient Lines/Drains/Airways Status       Active Lines, Drains and Airways       Name Placement date Placement time Site Days    Permacath Catheter - Tunneled 07/14/22 Subclavian vein catheter Right 07/14/22  1404  -- 17    Midline IV 07/01/22 Anterior;Left Upper Arm 07/01/22  1315  Upper Arm  30                         VITAL SIGNS     Vitals:    07/31/22 1913   BP: 138/66   Pulse: 87   Resp: 20   Temp: 99 F (37.2 C)   SpO2: 95%       Temp  Min: 98 F (36.7 C)  Max: 99 F (37.2 C)  Pulse  Min: 71  Max: 89  Resp  Min: 15  Max: 21  BP  Min: 117/75  Max: 185/78  SpO2  Min: 95 %  Max: 96 %    \               CARE PLAN        Problem: Moderate/High Fall Risk Score >5  Goal: Patient will remain free of falls  Outcome: Progressing  Flowsheets (Taken 07/29/2022 1755 by Lorriane Shire, RN)  VH High Risk (Greater than 13):   RED "HIGH FALL RISK" SIGNAGE   BED ALARM WILL BE ACTIVATED WHEN THE PATEINT IS IN BED WITH SIGNAGE "RESET BED ALARM"   A CHAIR PAD ALARM  WILL BE USED WHEN PATIENT IS UP SITTING IN A CHAIR   A safety companion may be used when deemed appropriate by the Primary RN and Clinical Administrator   PATIENT IS TO BE SUPERVISED FOR ALL TOILETING ACTIVITIES   Keep door open for better visibility   Include family/significant other in multidisciplinary discussion regarding plan of care as appropriate   Request PT/OT therapy consult order from physician for patients with gait/mobility impairment   Use assistive devices   Use chair-pad alarm device   Use of floor mat   Use of roll guard     Problem: Compromised Sensory Perception  Goal: Sensory Perception Interventions  Outcome: Progressing  Flowsheets (Taken 07/31/2022 0800)  Sensory Perception Interventions: Offload heels, Pad bony prominences, Reposition q 2hrs/turn Clock, Q2 hour skin assessment under devices if present     Problem: Compromised Moisture  Goal: Moisture level Interventions  Outcome: Progressing  Flowsheets (Taken 07/31/2022 0800)  Moisture level Interventions: Moisture wicking products, Moisture barrier cream     Problem: Compromised Activity/Mobility  Goal: Activity/Mobility Interventions  Outcome: Progressing  Flowsheets (Taken 07/31/2022 0800)  Activity/Mobility Interventions: Pad bony prominences, TAP Seated positioning system when OOB, Promote PMP, Reposition q 2 hrs / turn clock, Offload heels     Problem: Compromised Nutrition  Goal: Nutrition Interventions  Outcome: Progressing  Flowsheets (Taken 07/31/2022 0800)  Nutrition Interventions: Discuss nutrition at Rounds, I&Os, Document % meal eaten, Daily weights     Problem: Compromised Friction/Shear  Goal: Friction and Shear Interventions  Outcome: Progressing  Flowsheets (Taken 07/30/2022 2124 by Connye Burkitt, RN)  Friction and Shear Interventions: Pad bony prominences, Off load heels, HOB 30 degrees or less unless contraindicated, Consider: TAP seated positioning, Heel foams     Problem: Inadequate Airway Clearance  Goal: Patent Airway  maintained  Outcome: Progressing  Flowsheets (Taken 07/29/2022 1755 by Lorriane Shire, RN)  Patent airway maintained:   Position patient for maximum ventilatory efficiency   Provide adequate fluid intake to liquefy secretions   Suction secretions as needed   Reinforce use of ordered respiratory interventions (i.e. CPAP, BiPAP, Incentive Spirometer, Acapella, etc.)   Reposition patient every 2 hours and as needed unless able to self-reposition  Goal: Normal respiratory rate/effort achieved/maintained  Outcome: Progressing  Flowsheets (Taken 07/24/2022 1549 by Peagler, Fannie Knee, RN)  Normal respiratory rate/effort achieved/maintained: Plan activities to conserve energy: plan rest periods     Problem: Infection Prevention  Goal: Free from infection  Outcome: Progressing  Flowsheets (Taken 07/29/2022 0019 by Kathrynn Ducking, RN)  Free from infection:   Monitor/assess vital signs   Assess incision for evidence of healing   Assess for signs and symptoms of infection     Problem: Impaired Mobility  Goal: Mobility/Activity is maintained at optimal level for patient  Outcome: Progressing  Flowsheets (Taken 07/29/2022 1755 by Lorriane Shire, RN)  Mobility/activity is maintained at optimal level for patient:   Consult/collaborate with Physical Therapy and/or Occupational Therapy   Encourage independent activity per ability     Problem: Constipation  Goal: Fluid and electrolyte balance are achieved/maintained  Outcome: Progressing  Goal: Elimination patterns are normal or improving  Outcome: Progressing     Problem: Safety  Goal: Patient will be free from injury during hospitalization  Outcome: Progressing  Goal: Patient will be free from infection during hospitalization  Outcome: Progressing     Problem: Pain  Goal: Pain at adequate level as identified by patient  Outcome: Progressing     Problem: Side Effects from Pain Analgesia  Goal: Patient will experience minimal side effects of analgesic therapy  Outcome: Progressing     Problem:  Hemodynamic Status: Cardiac  Goal: Stable vital signs and fluid balance  Outcome: Progressing     Problem: Renal Instability  Goal: Fluid and electrolyte balance are achieved/maintained  Outcome: Progressing     Problem: Patient Receiving Advanced Renal Therapies  Goal: Therapy access site remains intact  Outcome: Progressing     Problem: Fluid and Electrolyte Imbalance/ Endocrine  Goal: Fluid and electrolyte balance are achieved/maintained  Outcome: Progressing  Goal: Adequate hydration  Outcome: Progressing     Problem: Diabetes: Glucose Imbalance  Goal: Blood glucose  stable at established goal  Outcome: Progressing     Problem: Inadequate Gas Exchange  Goal: Patent Airway maintained  Outcome: Progressing  Flowsheets (Taken 07/29/2022 1755 by Lorriane Shire, RN)  Patent airway maintained:   Position patient for maximum ventilatory efficiency   Provide adequate fluid intake to liquefy secretions   Suction secretions as needed   Reinforce use of ordered respiratory interventions (i.e. CPAP, BiPAP, Incentive Spirometer, Acapella, etc.)   Reposition patient every 2 hours and as needed unless able to self-reposition  Goal: Adequate oxygenation and improved ventilation  Outcome: Progressing     Problem: Neurological Deficit  Goal: Neurological status is stable or improving  Outcome: Progressing     Problem: Potential for Aspiration  Goal: Risk of aspiration will be minimized  Outcome: Progressing     Problem: Every Day - Stroke  Goal: Mobility/Activity is maintained at optimal level for patient  Outcome: Progressing  Flowsheets (Taken 07/29/2022 1755 by Lorriane Shire, RN)  Mobility/activity is maintained at optimal level for patient:   Consult/collaborate with Physical Therapy and/or Occupational Therapy   Encourage independent activity per ability  Goal: Elimination patterns are normal or improving  Outcome: Progressing  Goal: Neurological status is stable or improving  Outcome: Progressing  Goal: Core/Quality measure  requirements - Daily  Outcome: Progressing  Goal: Stable vital signs and fluid balance  Outcome: Progressing  Goal: Patient will maintain adequate oxygenation  Outcome: Progressing  Goal: Patient's risk of aspiration will be minimized  Outcome: Progressing  Goal: Nutritional intake is adequate  Outcome: Progressing  Goal: Skin integrity is maintained or improved  Outcome: Progressing  Goal: Neurovascular status is stable or improving  Outcome: Progressing  Goal: Effective coping demonstrated  Outcome: Progressing  Goal: Will be able to express needs and understand communication  Outcome: Progressing     Problem: Day of Discharge - Stroke  Goal: Able to express understanding of discharge instructions and stroke education  Outcome: Progressing  Goal: Core/Quality measures - Discharge  Outcome: Progressing     Problem: Pain interferes with ability to perform ADL  Goal: Pain at adequate level as identified by patient  Outcome: Progressing     Problem: Everyday - Heart Failure  Goal: Mobility/Activity is Maintained at Optimal Level for Patient  Outcome: Progressing  Goal: Nutritional Intake is Adequate  Outcome: Progressing   4 eyes in 4 hours pressure injury assessment note:      Completed with: Tina  [address]             Bony Prominences: Check appropriate box; if wound is present enter wound assessment in LDA     Occiput:                 [x] WNL  []  Wound present  Face:                     [x] WNL  []  Wound present  Ears:                      [x] WNL  []  Wound present  Spine:                    [x] WNL  []  Wound present  Shoulders:             [x] WNL  []  Wound present  Elbows:                  [x] WNL  []  Wound present  Sacrum/coccyx:     []   WNL  [x]  Wound present  Ischial Tuberosity:  [] WNL  [x]  Wound present  Trochanter/Hip:      [x] WNL  []  Wound present  Knees:                   [x] WNL  []  Wound present  Ankles:                   [x] WNL  []  Wound present  Heels:                    [x] WNL  []  Wound present  Other pressure  areas:  []  Wound location       Device related: []  Device name:         LDA completed if wound present: yes/no  Consult WOCN if necessary    Other skin related issues, ie tears, rash, etc, document in Integumentary flowsheet

## 2022-07-31 NOTE — Progress Notes (Signed)
Hd tx started via R CVC, working well,  VS WDL, pt awake & alert, Report received from [facility] RN.   07/31/22 1045   Bedside Nurse Communication   Name of bedside RN - pre dialysis [facility] RN   Treatment Initiation- With Dialysis Precautions   Time Out/Safety Check Completed Yes   Consent for HD signed for this hospitalization (Date) 06/01/22   Consent for HD signed for this hospitalization (Time) 1516   Preferred language No   Blood Consent Verified N/A   Dialysis Precautions All Connections Secured   Dialysis Treatment Type Routine   Is patient diabetic? Yes   RO/Hemodialysis Cabin crew   Is Total Chlorine less than 0.1 ppm? Yes   Orignial Total Chlorine Testing Time 1030   At 4 Hour Total Chlorine Testing Time 1430   RO/Hemodialysis Arts development officer Number 3    Machine Serial Number [id number]   RO # [id number]   RO Serial # [id number]   Water Hardness NA   pH 7.2   Pressure Test Verified Yes   Alarms Verified Passed   Machine Temperature 97.7 F (36.5 C)   Alarms Verified Yes   Na+ mEq (Machine) 138 mEq   Bicarb mEq (Machine) 35 mEq   Hemodialysis Conductivity (Machine) 13.6   Hemodialysis Conductivity (Meter) 13.5   Dialyzer Lot Number [id number]   Tubing Lot Number [id number]   RO Machine Log Completed Yes   Hepatitis Status   HBsAg (Antigen) Result Negative   HBsAg Date Drawn 07/03/22   HBsAg Repeat Draw Due Date 07/31/22   Dialysis Weight   Pre-Treatment Weight (Kg) 87.3   Scale Type ICU Bed Scale   Vitals   Temp 98 F (36.7 C)   Heart Rate 87   Resp Rate 15   BP 180/76   O2 Device None (Room air)   Assessment   Mental Status Alert;Cooperative   Cardiac (WDL) WDL   Cardiac Regularity Return to [facility]   Cardiac Symptoms Return to [facility]   Cardiac Rhythm Normal sinus rhythm   Respiratory  WDL   Respiratory Pattern Return to WDL   Bilateral Breath Sounds Diminished   R Breath Sounds Diminished   L Breath Sounds Diminished   Cough Spontaneous   Edema  WDL   Generalized Edema Non Pitting Edema    RLE Edema Non Pitting Edema   LLE Edema Non Pitting Edema   General Skin Color Appropriate for ethnicity   Skin Condition/Temp Return to [facility]   Gastrointestinal (WDL) X   Abdomen Inspection   (WOUND VAC)   Mobility Bed   Permacath Catheter - Tunneled 07/14/22 Subclavian vein catheter Right   Placement Date/Time: 07/14/22 1404   Inserted by: Dr. Welton Flakes  Access Type: Subclavian vein catheter  Orientation: Right  Central Line Infection Prevention Education provided?: Yes  Hand Hygiene: Alcohol based hand scrub;Soap and water  Line cart u...   Line necessity reviewed? Apheresis/hemodialysis   Site Assessment Clean;Dry;Intact   Catheter Lumen Volume Venous 1.6 mL   Catheter Lumen Volume Arterial 1.6 mL   Dressing Status and Intervention Dressing Intact;Clean & Dry   Tego/Curos Caps on Catheter Yes   NEW Tego/Curos Caps placed (Date) 07/31/22   APHERESIS ONLY:  Access  Red   APHERESIS ONLY: Return Blue   End Caps Free From Blood Yes   Line Care Connections checked and tightened   Dressing Type Transparent;Biopatch   Dressing Status Clean;Dry;Intact   Dressing/Line Intervention Caps changed;Dressing  changed   Pain Assessment   Charting Type Assessment   Pain Scale Used CPOT   Numeric Pain Scale   Pain Score 0   POSS Score 1   Multiple Pain Sites No   Hemodialysis Comments   Pre-Hemodialysis Comments timeout & safety checks done

## 2022-07-31 NOTE — Plan of Care (Signed)
[facility] Plan of Care:    Informed that patient and family prefer to defer wound debridement today. Please reach out once patient would like to proceed, and hold hep gtt for at least 4hrs prior. Ideally can coordinately with WOCN vac change.     Susy Manor, MD  General Surgery, PGY-3

## 2022-07-31 NOTE — Plan of Care (Addendum)
SHIFT EVENTS     Shift Narrative/Significant Events (PRN med administration, fall, RRT, etc.):     Patient alert unable to assess orientation,   Patient needs  assistance for all ADLS  Patient Hgb 6.8 MD notified,  New orders to repeat H/H incoming nurse informed.  Scheduled meds given  Plan of care ongoing  Safety and fall precautions remain in place. Purposeful rounding completed.          CARE PLAN         Problem: Moderate/High Fall Risk Score >5  Goal: Patient will remain free of falls  Outcome: Progressing  Flowsheets (Taken 07/31/2022 0618)  VH Moderate Risk (6-13): YELLOW "FALL RISK" ARM BAND     Problem: Impaired Mobility  Goal: Mobility/Activity is maintained at optimal level for patient  Outcome: Progressing     Problem: Constipation  Goal: Fluid and electrolyte balance are achieved/maintained  Outcome: Progressing  Flowsheets (Taken 07/31/2022 0618)  Fluid and electrolyte balance are achieved/maintained: Assess and reassess fluid and electrolyte status     Problem: Safety  Goal: Patient will be free from injury during hospitalization  Outcome: Progressing  Flowsheets (Taken 07/31/2022 0618)  Patient will be free from injury during hospitalization: Include patient/ family/ care giver in decisions related to safety     Problem: Pain  Goal: Pain at adequate level as identified by patient  Outcome: Progressing  Flowsheets (Taken 07/31/2022 0618)  Pain at adequate level as identified by patient:   Reassess pain within 30-60 minutes of any procedure/intervention, per Pain Assessment, Intervention, Reassessment (AIR) Cycle   Evaluate if patient comfort function goal is met   Evaluate patient's satisfaction with pain management progress     Problem: Side Effects from Pain Analgesia  Goal: Patient will experience minimal side effects of analgesic therapy  Outcome: Progressing  Flowsheets (Taken 07/31/2022 1610)  Patient will experience minimal side effects of analgesic therapy: Assess for changes in cognitive  function     Problem: Fluid and Electrolyte Imbalance/ Endocrine  Goal: Fluid and electrolyte balance are achieved/maintained  Outcome: Progressing  Flowsheets (Taken 07/31/2022 0618)  Fluid and electrolyte balance are achieved/maintained: Assess and reassess fluid and electrolyte status     Problem: Every Day - Stroke  Goal: Mobility/Activity is maintained at optimal level for patient  Outcome: Progressing     Problem: Pain interferes with ability to perform ADL  Goal: Pain at adequate level as identified by patient  Outcome: Progressing  Flowsheets (Taken 07/31/2022 0618)  Pain at adequate level as identified by patient:   Reassess pain within 30-60 minutes of any procedure/intervention, per Pain Assessment, Intervention, Reassessment (AIR) Cycle   Evaluate if patient comfort function goal is met   Evaluate patient's satisfaction with pain management progress

## 2022-07-31 NOTE — Progress Notes (Signed)
[facility]  PROGRESS NOTE      Patient: Shelby Barron  Date: 07/31/2022   LOS: 33 Days  Admission Date: 06/28/2022   MRN: [id number]  Attending: Melina Fiddler, MD  When on service as the attending, please contact me on Epic Secure Chat from 7 AM - 7 PM for non-urgent issues. For urgent matters use XTend page from 7 AM - 7 PM.       ASSESSMENT/PLAN     Shelby Barron is a 82 y.o. female admitted with Bladder cancer    Toxic metabolic Encephalopathy/delirium resolving  - Patient was upgraded to ICU on 6/30 for lethargy and decreased responsiveness  - CT head was repeated on 6/29, no acute finding  - ABG consistent with respiratory alkalosis  - EEG consistent with encephalopathy no seizure, MRI no acute stroke  - CT abdomen with and without contrast after premedication did not show any leak or abscess  - SLP advanced diet to pureed.   -NG tube removed 7/9  -SLP following daily  - Palliative consulted:  Family is clear they would like no limits to care and patient should remain full code.     Anemia, 2/2 CKD and blood loss during OR  H&H 6.8/21  Will transfuse 1 units of PRBC during hemodialysis today  - TSAT 18%  - continue Aranesp per nephrology  - Trend CBC, transfuse Hgb<7. .  No active signs of bleeding    s/p open cystectomy, bilateral nephrectomies, and resection of small bowel after intra-op enterotomy    Mixed shock - hemorrhagic vs septic: Resolved  Leukocytosis  ?  Necrotic wound tissue  -I reviewed the images from last wound care note on July 8 which showed some necrotic tissue    -I discussed with general surgery who agreed the patient needs debridement but needs to hold Eliquis for 2 days prior  Will hold Eliquis starting now and switch to heparin drip currently on hold pending debridement at bedside by general surgery team  Plan for bedside debridement by general surgery team today  Heparin held since a.m. resumed after debridement  I discussed with ID and plan for short course of antibiotic  with debridement.  Short course of meropenem  - 6/10 small bowel enteretomy with 1L EBL. Went to the OR on June 12 for small bowel anastomosis, closure of mesenteric defect, closure of abdominal wall with wound VAC application. Wound VAC last changed on 7/11  - Surgery consulted, recommend CTAP with PO contrast that showed no anastomotic leak. Low concern for abscesses as they'd expect these to have a more loculated appearance in the interim between 6/17 and 7/1 imaging  - ID consulted, completed meropenem and Mycamine for 2 weeks on 7/3.         Acute CVA on the right frontal lobe most likely from hypotension and shock  History of CVA  Petechial hemorrhage vs cortical laminar necrosis on CTH  - Left upper extremity not moving at all unclear duration  - Neurology following, initially started on ASA 81mg  only due to concern for risk of hemorrhagic conversion. Now OK to switch to Eliquis for Afib and discontinue aspirin per neurology on 7/5 as > 10d post event.  - SBP goal 120-160  - Carotid Doppler showed plaque with no critical stenosis  - Head CT on 7/7 w/ a few areas of cortical hyperdensity - petechial hemorrhage vs cortical laminar necrosis. Discussed with neuro OK to continue eliquis for now. Plan for  repeat CTH today. Neuro exam stable.  - EEG encephalopathic pattern no seizure, repeat MRI no new infarction  - PT OT recommend SNF  -Repeat CT head on 07/26/2022 stable     Acute hypoxemic respiratory failure resolved  COPD  Extubated on 6/13, currently on room air  Maintain SaO2 > 92%  Continue as needed neb treatment  X-ray repeated on 6/29 showed resolution of interstitial edema     ESRD  S/p CRRT this admission, now on HD TTS  Continue dialysis per nephrology  IR removed the nonfunctional permacath and right Quinton IJ cath and replaced with a new permacath on 6/26.     HTN  On metoprolol   PRN IV labetalol for SBP > 180      New onset Afib with RVR, improved  - CHA2DS2-VASc score is 7  - Status post amiodarone  drip. Now stable on oral amiodarone and coreg per cardiology recommendations  - GI and general surgery cleared for anticoagulation. Neurology recommended to hold anticoagulation initiation for 10 days given the size of stroke.   -  started on Eliquis since 07/23/22  Hold Eliquis starting today 7/10 for 2 days prior to wound debridement     Renal cell carcinoma outpatient follow-up hematology oncology  Urothelial carcinoma  Thrombocytopenia  - VTE ppx: SCDs, eliquis     Diabetes Mellitus type 2  -Sliding scale insulin                        Nutrition: Pured diet as tolerated  Malnutrition Documentation    Moderate Malnutrition related to inadequate nutritional intake in the setting of acute illness as evidenced by <50% EER > 5 days, mild muscle losses (temporalis, deltoid) - new                 Patient has BMI=Body mass index is 35.21 kg/m.  Diagnosis: Obesity based on BMI criteria                 Recent Labs   Lab 07/31/22  0754 07/31/22  0449 07/30/22  0317   Hemoglobin 7.2* 6.8* 7.1*   Hematocrit 22.5* 21.4* 22.1*   MCV 91.5 91.8 91.7   WBC 11.91* 11.63* 12.61*   Platelet Count 345 333 347*     Recent Labs   Lab 07/18/22  0421 06/02/22  0514   Ferritin 2,300.21*  --    Iron 18* 62   TIBC 99* 145*   Iron Saturation 18 43     Anemia Diagnosis: Already documented in note    Code Status: Full Code    Dispo: SNF    Family Contact: Daughter [name]     DVT Prophylaxis:   Current Facility-Administered Medications (Includes Only Anticoagulants, Misc. Hematological)   Medication Dose Route Last Admin    [Held by provider] heparin 25,000 units in dextrose 5% 500 mL infusion (premix)  11 Units/kg/hr Intravenous Stopped at 07/31/22 0529          CHART  REVIEW & DISCUSSION     The following chart items were reviewed as of 12:36 PM on 07/31/22:  [x]  Lab Results [x]  Imaging Results   [x]  Problem List  [x]  Current Orders [x]  Current Medications  []  Allergies  [x]  Code Status [x]  Previous Notes   []  SDoH    The management and plan  of care for this patient was discussed with the following specialty consultants:  []  Cardiology  []  Gastroenterology                 [  x] Infectious Disease  []  Pulmonology []  Neurology                [x]  Nephrology  []  Neurosurgery []  Orthopedic Surgery  []  Heme/Onc  [x]  General Surgery []  Psychiatry                                   []  Palliative    SUBJECTIVE     Shelby Barron reports feeling okay except for abdominal pain and she was requesting pain medication she denies any fever any chills any headache any dizziness any blurry vision any nausea any vomiting any chest pain and shortness of breath  MEDICATIONS     Current Facility-Administered Medications   Medication Dose Route Frequency    acetaminophen  650 mg Oral 4 times per day    Or    acetaminophen  650 mg Oral 4 times per day    amiodarone  200 mg per NG tube Daily    atorvastatin  40 mg Oral QHS    carvedilol  12.5 mg Oral Q12H SCH    darbepoetin alfa  0.45 mcg/kg Subcutaneous Weekly    glycopyrrolate  0.2 mg Intravenous Q8H    insulin glargine  5 Units Subcutaneous QAM    insulin lispro  1-3 Units Subcutaneous QHS    insulin lispro  1-5 Units Subcutaneous TID AC    meropenem  500 mg Intravenous Q24H    pantoprazole  40 mg per NG tube Q12H [facility]    thiamine  100 mg per NG tube Daily    umeclidinium-vilanterol  1 puff Inhalation QAM    vitamins/minerals  1 tablet Oral Daily       PHYSICAL EXAM     Vitals:    07/31/22 1215   BP: 167/71   Pulse: 80   Resp:    Temp:    SpO2:        Temperature: Temp  Min: 98 F (36.7 C)  Max: 99 F (37.2 C)  Pulse: Pulse  Min: 68  Max: 87  Respiratory: Resp  Min: 15  Max: 21  Non-Invasive BP: BP  Min: 131/75  Max: 185/78  Pulse Oximetry SpO2  Min: 96 %  Max: 99 %    Intake and Output Summary (Last 24 hours) at Date Time    Intake/Output Summary (Last 24 hours) at 07/31/2022 1236  Last data filed at 07/31/2022 1000  Gross per 24 hour   Intake 350 ml   Output 0 ml   Net 350 ml         GEN APPEARANCE: Alert, awake and oriented  x 1  HEENT: PERLA; EOMI; Conjunctiva Clear  NECK: Supple; No bruits  CVS: RRR, S1, S2; No M/G/R  LUNGS: CTAB; , no Rhonchi: No rales  ABD: Soft; No TTP; + Normoactive BS vertical abdominal wound and wound VAC in place  EXT: No edema; Pulses 2+ and intact  NEURO: moving right upper extremity spontaneously, able to move lower extremities for pain but minimal, no movement at all on the left upper extremity      LABS     Recent Labs   Lab 07/31/22  0754 07/31/22  0449 07/30/22  0317   WBC 11.91* 11.63* 12.61*   RBC 2.46* 2.33* 2.41*   Hemoglobin 7.2* 6.8* 7.1*   Hematocrit 22.5* 21.4* 22.1*   MCV 91.5 91.8 91.7   Platelet Count 345 333 347*  Recent Labs   Lab 07/31/22  0449 07/30/22  0317 07/29/22  0350 07/28/22  0537 07/27/22  0338   Sodium 136 137 142 143 138   Potassium 4.7 4.2 4.4 3.9 4.0   Chloride 99 100 103 103 99   CO2 24 26 25 25 23    BUN 43* 31* 59* 41* 61*   Creatinine 4.5* 3.2* 5.3* 3.9* 4.7*   Glucose 82 85 105* 81 182*   Calcium 8.6 8.5 9.5 8.7 8.9   Magnesium 1.9 1.8 2.1 1.9 2.0                   Recent Labs   Lab 07/31/22  0449 07/30/22  2008 07/30/22  0317   PTT 52* 28 46*       Microbiology Results (last 15 days)       Procedure Component Value Units Date/Time    Culture, Blood, Aerobic And Anaerobic [[id number]] Collected: 07/18/22 1400    Order Status: Completed Specimen: Blood, Venous Updated: 07/23/22 2200     Culture Blood No growth at 5 days    Culture, Blood, Aerobic And Anaerobic [[id number]] Collected: 07/18/22 1400    Order Status: Completed Specimen: Blood, Venous Updated: 07/23/22 2200     Culture Blood No growth at 5 days             RADIOLOGY     CT Head WO Contrast    Result Date: 07/26/2022   1.No significant change in multifocal areas of subacute infarction within bilateral cerebral hemispheres. 2.Small areas of petechial hemorrhage in the left parietal and occipital lobes are unchanged. No new hemorrhage or measurable parenchymal hematoma. 3.Persistent complete opacification of the  right mastoid air cells and middle ear cavity. Garnette Gunner, MD 07/26/2022 12:27 PM    CT Head WO Contrast    Result Date: 07/25/2022   1.Redemonstration of multifocal areas of bilateral acute infarctions. A few areas of cortical hyperdensity likely represent petechial hemorrhage. No mass occupying hematoma. No significant mass effect or midline shift. 2.Redemonstrated right mastoid and right middle ear cavity effusion with areas of bony thinning versus dehiscence of the right tegmen tympani and right tegmen mastoideum. Vara Guardian, MD 07/25/2022 2:19 PM    CT Abdomen Pelvis W IV And PO Cont    Result Date: 07/19/2022   1.Slight interval increase in left-sided retroperitoneal fluid collection with the other abdominal fluid collections appearing relatively stable. 2. There is no definite abscess or acute inflammatory process. 3. Incidental note is made of a small paraesophageal type hernia. 4. Sigmoid diverticulosis is noted without CT evidence of diverticulitis. 5. Other findings as noted above. Miguel Dibble, MD 07/19/2022 6:36 AM    MRI Brain WO Contrast    Result Date: 07/18/2022   Interval evolution of multifocal recent infarcts in the bilateral cerebral hemispheres. No new infarct. Milan Waunita Schooner, MD 07/18/2022 5:57 PM    XR Chest AP Portable    Result Date: 07/17/2022  1.Near-complete resolution of interstitial edema. 2.Improving pulmonary vascular congestion. Legrand Pitts, MD 07/17/2022 2:38 PM    CT Head WO Contrast    Result Date: 07/17/2022   1.No new acute intracranial pathology. 2.There are multiple regions of cytotoxic edema in the right > left frontal and left > right occipital lobes, consistent with known early subacute infarcts. Dannielle Burn 07/17/2022 8:26 AM    US Carotid Duplex Dopp Comp Bilateral    Result Date: 07/15/2022   1. Plaque formation in the right internal carotid  artery bulb with elevated velocities. Stenosis is estimated at 50-69%. 2. Plaque in the left internal carotid artery with normal  velocities. Any stenosis is less than 50%. Criteria for stenosis and velocity parameters are based on the guidelines of the SRU (Society of Radiologists in Ultrasound). Grant et al: Carotid Artery Stenosis: Grayscale and Doppler Ultrasound Diagnosis-- Society of Radiologists in Ultrasound Consensus Conference Ultrasound Quarterly. Volume 19, Number 4; December, 2003. Recommendations updated October 2021 Verne Carrow is an [facility] accredited facility Suszanne Finch, MD 07/15/2022 9:41 AM    Tunneled Cath Removal (Permcath)    Result Date: 07/14/2022  1. Nonfunctioning tunneled dialysis catheter as described above. 2. Successful placement of a new 19 cm Equistream catheter as described above. Dimitrios Papadouris, MD 07/14/2022 4:06 PM    Tunneled Cath Placement (Permcath)    Result Date: 07/14/2022  1. Nonfunctioning tunneled dialysis catheter as described above. 2. Successful placement of a new 19 cm Equistream catheter as described above. Dimitrios Papadouris, MD 07/14/2022 4:06 PM    MRI Brain WO Contrast    Result Date: 07/13/2022   1.Patchy areas of acute/recent infarction involving the cerebral hemispheres bilaterally. The infarcts have a configuration suggesting watershed territory infarcts. 2.Chronic ischemic changes as discussed. Georgann Housekeeper, MD 07/13/2022 10:28 PM    CT Head WO Contrast    Result Date: 07/13/2022   1.New region of loss of gray-white matter differentiation in the right frontal lobe, concerning for acute infarction. 2.A few additional areas of poor gray-white matter differentiation for example in the left occipital lobe may be artifactual but could be correlated with MRI. 3.No evidence of acute intracranial hemorrhage or significant mass effect. These urgent results were discussed with and acknowledged by Dr. Don Broach on 07/13/2022 3:20 PM. Vara Guardian, MD 07/13/2022 3:21 PM    XR Chest AP Portable    Result Date: 07/10/2022  Interstitial markings have slightly increased, suggesting minimal edema. No other  change. Wilmon Pali, MD 07/10/2022 4:49 PM    XR Chest AP Portable    Result Date: 07/07/2022   There is mild interstitial prominence suggesting mild congestive heart failure/fluid overload in the setting of shortness of breath [name], MD 07/07/2022 12:32 PM    XR Chest AP Portable    Result Date: 07/06/2022   Hypoventilation with basilar atelectasis. Charlott Rakes, MD 07/06/2022 3:35 PM    CT Abdomen Pelvis W IV And PO Cont    Result Date: 07/06/2022  1.Postoperative changes from bilateral nephrectomy and cystectomy. 2.Small amount of ascites including some areas which appear loculated. Small collections of fluid and gas in the anterior midline abdomen may be postoperative but superimposed infection is not excluded. No rim-enhancing fluid collections or extravasation of oral contrast to suggest anastomotic leak. Demetrios Isaacs, MD 07/06/2022 5:51 AM    CT Head WO Contrast    Result Date: 07/06/2022   1.No acute intracranial abnormality is seen. Leandro Reasoner, MD 07/06/2022 3:56 AM    XR Abdomen AP    Result Date: 07/05/2022  Enteric tube terminates in the distal stomach. Elease Etienne, DO 07/05/2022 12:24 PM   Echo Results       None          Results for orders placed or performed during the hospital encounter of 06/28/22   MRI Brain WO Contrast    Narrative    HISTORY: Declining mental status, right gaze deviation.     COMPARISON: CT head 07/17/2022 and MRI brain 07/13/2022.     TECHNIQUE: MRI of  the brain performed on a 1.5 Tesla scanner without  intravenous contrast.    CONTRAST: None.    FINDINGS:     Interval evolution of multifocal recent infarcts with associated edema in  the bilateral cerebral hemispheres. No new infarct. Mild chronic small  vessel ischemic changes within the cerebral white matter. Mild generalized  cerebral volume loss.     No acute hemorrhage, extra-axial fluid collection, or midline shift. No  mass lesion or vasogenic edema.     Normal appearance of the sella. Cerebellar tonsils are  appropriately  positioned. No acute hydrocephalus. Patent basal cisterns. Skull base flow  voids are intact.    No osseous abnormality. No acute orbital abnormality. Status post bilateral  lens replacement. No significant paranasal sinus mucosal disease. Severe  opacification of the right mastoid air cells and middle ear cavity. Mild  opacification of left mastoid air cells.       Impression         Interval evolution of multifocal recent infarcts in the bilateral cerebral  hemispheres. No new infarct.    Milan Waunita Schooner, MD  07/18/2022 5:57 PM   CT Head WO Contrast    Narrative    HISTORY: Petechial hemorrhage follow up.     COMPARISON: Multiple priors most recent CT 07/25/2022    TECHNIQUE: CT of the head performed without intravenous contrast. The  following dose reduction techniques were utilized: automated exposure  control and/or adjustment of the mA and/or KV according to patient size,  and the use of an iterative reconstruction technique.    CONTRAST: None.    FINDINGS:  Multifocal areas of subacute infarction within bilateral cerebral  hemispheres remain similar to prior. Small areas of petechial hemorrhage in  the left parietal and occipital lobes are unchanged. No new hemorrhage or  measurable parenchymal hematoma.    There is a background moderate chronic microangiopathic changes in the  periventricular white matter. Moderate generalized volume loss. No  extra-axial fluid collection or midline shift. Ventricles and basal  cisterns are patent.    Persistent complete opacification of the right mastoid air cells and middle  ear cavity. Paranasal sinuses are clear.      Impression       1.No significant change in multifocal areas of subacute infarction within  bilateral cerebral hemispheres.  2.Small areas of petechial hemorrhage in the left parietal and occipital  lobes are unchanged. No new hemorrhage or measurable parenchymal hematoma.  3.Persistent complete opacification of the right mastoid air  cells and  middle ear cavity.    Garnette Gunner, MD  07/26/2022 12:27 PM       Signed,  Melina Fiddler, MD  12:36 PM 07/31/2022

## 2022-07-31 NOTE — Progress Notes (Signed)
HD TX completed x3.5hrs, -2.2L fluid removed, tolerated well, 1 unit RBC given during tx, BT done no reaction noted, VS WDL, awake & responsive, Report given to [facility] RN.   07/31/22 1420   Treatment Summary   Time Off Machine 1415   Duration of Treatment (Hours) 3.5   Treatment Type 2:1   Dialyzer Clearance Moderately streaked   Fluid Volume Off (mL) 3000   Prime Volume (mL) 200   Rinseback Volume (mL) 200   Fluid Given: Normal Saline (mL) 0   Fluid Given: PRBC  325 mL   Fluid Given: Albumin (mL) 0   Fluid Given: Other (mL) 0   Total Fluid Given 725   Hemodialysis Net Fluid Removed 2275   Post Treatment Assessment   Post-Treatment Weight (Kg) 2.2   Patient Response to Treatment tolerated well   Additional Dialyzer Used 0   Permacath Catheter - Tunneled 07/14/22 Subclavian vein catheter Right   Placement Date/Time: 07/14/22 1404   Inserted by: Dr. Welton Flakes  Access Type: Subclavian vein catheter  Orientation: Right  Central Line Infection Prevention Education provided?: Yes  Hand Hygiene: Alcohol based hand scrub;Soap and water  Line cart u...   Line necessity reviewed? Apheresis/hemodialysis   Site Assessment Clean;Dry;Intact   Catheter Lumen Volume Venous 1.6 mL   Catheter Lumen Volume Arterial 1.6 mL   Dressing Status and Intervention Dressing Intact;Clean & Dry   Tego/Curos Caps on Catheter Yes   NEW Tego/Curos Caps placed (Date) 07/31/22   APHERESIS ONLY:  Access  Red   APHERESIS ONLY: Return Blue   End Caps Free From Blood Yes   Line Care Connections checked and tightened   Dressing Type Transparent;Biopatch   Dressing Status Clean;Dry;Intact   Dressing/Line Intervention Caps changed   Vitals   Temp 98.1 F (36.7 C)   Heart Rate 80   Resp Rate 15   BP 134/63   O2 Device None (Room air)   Assessment   Mental Status Alert;Oriented;Cooperative   Cardiac (WDL) WDL   Cardiac Regularity Return to [facility]   Cardiac Symptoms Return to [facility]   Cardiac Rhythm Normal sinus rhythm   Respiratory  WDL   Respiratory Pattern  Return to WDL   Bilateral Breath Sounds Diminished   R Breath Sounds Diminished   L Breath Sounds Diminished   Cough Spontaneous   Edema  WDL   Generalized Edema Non Pitting Edema   RLE Edema Non Pitting Edema   LLE Edema Non Pitting Edema   General Skin Color Appropriate for ethnicity   Skin Condition/Temp Return to WDL   Gastrointestinal (WDL) X   Abdomen Inspection   (wound vac)   Mobility Bed   Pain Assessment   Charting Type Reassessment   Pain Scale Used CPOT   Numeric Pain Scale   Pain Score 0   POSS Score 1   Multiple Pain Sites No   Education   Person taught Patient   Knowledge basis Substantial   Topics taught Procedure   Teaching Tools Explain   Reponse Needs Reinforcement   Bedside Nurse Communication   Name of bedside RN - post dialysis Cleotis Lema RN

## 2022-08-01 LAB — COMPREHENSIVE METABOLIC PANEL
ALT: 10 U/L (ref 0–55)
AST (SGOT): 16 U/L (ref 5–41)
Albumin/Globulin Ratio: 0.6 — ABNORMAL LOW (ref 0.9–2.2)
Albumin: 1.7 g/dL — ABNORMAL LOW (ref 3.5–5.0)
Alkaline Phosphatase: 100 U/L (ref 37–117)
Anion Gap: 12 (ref 5.0–15.0)
BUN: 23 mg/dL — ABNORMAL HIGH (ref 7–21)
Bilirubin, Total: 0.3 mg/dL (ref 0.2–1.2)
CO2: 25 mEq/L (ref 17–29)
Calcium: 8.4 mg/dL (ref 7.9–10.2)
Chloride: 103 mEq/L (ref 99–111)
Creatinine: 3.2 mg/dL — ABNORMAL HIGH (ref 0.4–1.0)
GFR: 13.7 mL/min/{1.73_m2} — ABNORMAL LOW (ref >=60.0)
Globulin: 3 g/dL (ref 2.0–3.6)
Glucose: 94 mg/dL (ref 70–100)
Potassium: 4.3 mEq/L (ref 3.5–5.3)
Protein, Total: 4.7 g/dL — ABNORMAL LOW (ref 6.0–8.3)
Sodium: 140 mEq/L (ref 135–145)

## 2022-08-01 LAB — CBC
Absolute nRBC: 0 10*3/uL (ref <=0.00)
Hematocrit: 28 % — ABNORMAL LOW (ref 34.7–43.7)
Hemoglobin: 9.4 g/dL — ABNORMAL LOW (ref 11.4–14.8)
MCH: 30 pg (ref 25.1–33.5)
MCHC: 33.6 g/dL (ref 31.5–35.8)
MCV: 89.5 fL (ref 78.0–96.0)
MPV: 9.5 fL (ref 8.9–12.5)
Platelet Count: 300 10*3/uL (ref 142–346)
RBC: 3.13 10*6/uL — ABNORMAL LOW (ref 3.90–5.10)
RDW: 15 % (ref 11–15)
WBC: 11.72 10*3/uL — ABNORMAL HIGH (ref 3.10–9.50)
nRBC %: 0 /100 WBC (ref <=0.0)

## 2022-08-01 LAB — WHOLE BLOOD GLUCOSE POCT
Whole Blood Glucose POCT: 95 mg/dL (ref 70–100)
Whole Blood Glucose POCT: 119 mg/dL — ABNORMAL HIGH (ref 70–100)
Whole Blood Glucose POCT: 127 mg/dL — ABNORMAL HIGH (ref 70–100)
Whole Blood Glucose POCT: 149 mg/dL — ABNORMAL HIGH (ref 70–100)

## 2022-08-01 LAB — ANTI-XA,UFH
Anti-Xa, UFH: 0.39 IU/mL
Anti-Xa, UFH: 0.36 IU/mL

## 2022-08-01 LAB — CULTURE, SPUTUM AND LOWER RESPIRATORY

## 2022-08-01 LAB — MAGNESIUM: Magnesium: 2 mg/dL (ref 1.6–2.6)

## 2022-08-01 LAB — APTT
PTT: 56 s — ABNORMAL HIGH (ref 27–39)
PTT: 48 s — ABNORMAL HIGH (ref 27–39)
PTT: 52 s — ABNORMAL HIGH (ref 27–39)

## 2022-08-01 LAB — LAB USE ONLY - FUNGAL IDENTIFICATION, MOLDS

## 2022-08-01 LAB — PHOSPHORUS: Phosphorus: 3.1 mg/dL (ref 2.3–4.7)

## 2022-08-01 MED ORDER — HEPARIN (PORCINE) IN D5W 50-5 UNIT/ML-% IV SOLN (UNITS/KG/HR ONLY)
11.0000 [IU]/kg/h | INTRAVENOUS | Status: DC
Start: 2022-08-01 — End: 2022-08-02
  Administered 2022-08-01: 11 [IU]/kg/h via INTRAVENOUS
  Filled 2022-08-01: qty 500

## 2022-08-01 MED ORDER — SODIUM CHLORIDE 0.9 % IV BOLUS
1000.0000 mL | Freq: Once | INTRAVENOUS | Status: DC
Start: 2022-08-01 — End: 2022-08-01

## 2022-08-01 MED ORDER — CALCIUM CARBONATE ANTACID 500 MG PO CHEW
500.0000 mg | CHEWABLE_TABLET | Freq: Three times a day (TID) | ORAL | Status: DC
Start: 2022-08-01 — End: 2022-08-02
  Administered 2022-08-01 – 2022-08-02 (×3): 500 mg via ORAL
  Filled 2022-08-01 (×3): qty 1

## 2022-08-01 NOTE — Progress Notes (Signed)
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 s/p bilateral radical nephrectomies and  cystectomy on 06/28/22  - complicated by small bowel enterotomy and blood loss   New onset afib - on amiodarone and eliquis  - eliquis on hold; on heparin gtt  High grade urothelial bladder CA: s/p bilateral nephrectomies and cystectomy c/b mall bowel enterotomy and blood loss (~1L) on 06/28/22  - awaiting wound debridement   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      Review of Systems:   + abdominal pain    Physical Exam:     Vitals:    08/01/22 0516 08/01/22 0517 08/01/22 0714 08/01/22 1125   BP: 129/70  155/64 145/74   Pulse: 75  82 75   Resp:   16 21   Temp:   98.8 F (37.1 C) 98.4 F (36.9 C)   TempSrc:   Oral Oral   SpO2: 95%  95% 97%   Weight:  85.6 kg (188 lb 11.4 oz)     Height:           Intake and Output Summary (Last 24 hours) at Date Time    Intake/Output Summary (Last 24 hours) at 08/01/2022 1147  Last data filed at 08/01/2022 0859  Gross per 24 hour   Intake 365 ml   Output 2275 ml   Net -1910 ml          General: awake, in no distress  Cardiovascular: regular rate and rhythm  Lungs: bilateral air entry  Abdomen: soft  Extremities: no edema  Access: R permacath     Meds:      Scheduled Meds: PRN Meds:    acetaminophen, 650 mg, Oral, 4 times per day   Or  acetaminophen, 650 mg, Oral, 4 times per day  amiodarone, 200 mg, per NG tube, Daily  atorvastatin, 40 mg, Oral, QHS  carvedilol, 12.5 mg, Oral, Q12H SCH  darbepoetin alfa, 0.45 mcg/kg, Subcutaneous, Weekly  glycopyrrolate, 0.2 mg, Intravenous, Q8H  insulin glargine, 5 Units, Subcutaneous, QAM  insulin lispro, 1-3 Units, Subcutaneous, QHS  insulin lispro, 1-5 Units, Subcutaneous, TID AC  lidocaine, 20 mL, Infiltration, Once  meropenem, 500 mg, Intravenous, Q24H  pantoprazole, 40 mg, per NG tube, Q12H SCH  thiamine, 100 mg, per NG tube, Daily  umeclidinium-vilanterol, 1 puff, Inhalation, QAM  vitamins/minerals, 1 tablet, Oral, Daily          Continuous Infusions:  heparin infusion 25,000 units/500 mL (Cardiac/Low Intensity) 11 Units/kg/hr (08/01/22 0824)        sodium chloride, , PRN  acetaminophen, 650 mg, Q6H PRN  albuterol-ipratropium, 3 mL, Q6H PRN  benzocaine, 1 spray, TID PRN  dextrose, 15 g of glucose, PRN   Or  dextrose, 12.5 g, PRN   Or  dextrose, 12.5 g, PRN   Or  glucagon (rDNA), 1 mg, PRN  diphenhydrAMINE, 25 mg, Q6H PRN  hydrALAZINE, 10 mg, Q3H PRN  labetalol, 10 mg, Q15 Min PRN  lidocaine 2% jelly, , Once PRN  naloxone, 0.2 mg, PRN  ondansetron, 4 mg, Q6H PRN  oxyCODONE, 2.5 mg, Q4H PRN  phenol, 1 spray, Q2H PRN              Labs:     Recent Labs   Lab 08/01/22  0320 07/31/22  0754 07/31/22  0449   WBC 11.72* 11.91* 11.63*   Hemoglobin 9.4* 7.2* 6.8*   Hematocrit 28.0* 22.5* 21.4*   Platelet Count 300 345 333     Recent Labs   Lab 08/01/22  0320 07/31/22  0449 07/30/22  0317   Sodium 140 136 137   Potassium 4.3 4.7 4.2   Chloride 103 99 100   CO2 25 24 26    BUN 23* 43* 31*   Creatinine 3.2* 4.5* 3.2*   Calcium 8.4 8.6 8.5   Albumin 1.7*  --   --     Phosphorus 3.1 3.8 2.7   Magnesium 2.0 1.9 1.8   Glucose 94 82 85   GFR 13.7* 9.1* 13.7*           Signed by: Joetta Manners, MD

## 2022-08-01 NOTE — Plan of Care (Signed)
NURSING SHIFT NOTE     Patient: Shelby Barron  Day: 16      SHIFT EVENTS     Shift Narrative/Significant Events (PRN med administration, fall, RRT, etc.):     - A&O to self and place on and off, on RA, delayed responses, able to follow command most of times  - Denies chest pain, dizziness, SOB, N/V  - VSS. Scheduled meds given in apple sauce. IV abx.  - Per Deno Etienne, MD plan for wound debridement tmr 7/15 along w WOCN wound vac change. Heparin gtt initiated 0824, aPTT 52, maintained at current dose. Per MD Monte Fantasia, hold heparin gtt tmr 7/15 @0600  for procedure  - Bed mobility, anuric. Skin care and incontinence care provided. Assist w feeding, fair appetite. Update given to pt's daughter by phone, verbalized understanding.      Safety and fall precautions remain in place. Purposeful rounding completed.          ASSESSMENT     Changes in assessment from patient's baseline this shift:    Neuro: No  CV: No  Pulm: No  Peripheral Vascular: No  HEENT: No  GI: No  BM during shift: No, Last BM: Last BM Date: 08/01/22  GU: No   Integ: No  MS: No    Pain: None  Pain Interventions: Rest  Medications Utilized: n/a    Mobility: PMP Activity: Step 3 - Bed Mobility of Distance Walked (ft) (Step 6,7): 0 Feet           Lines     Patient Lines/Drains/Airways Status       Active Lines, Drains and Airways       Name Placement date Placement time Site Days    Permacath Catheter - Tunneled 07/14/22 Subclavian vein catheter Right 07/14/22  1404  -- 17    Midline IV 07/01/22 Anterior;Left Upper Arm 07/01/22  1315  Upper Arm  30                         VITAL SIGNS     Vitals:    08/01/22 1125   BP: 145/74   Pulse: 75   Resp: 21   Temp: 98.4 F (36.9 C)   SpO2: 97%       Temp  Min: 97.9 F (36.6 C)  Max: 99.1 F (37.3 C)  Pulse  Min: 74  Max: 89  Resp  Min: 15  Max: 23  BP  Min: 129/70  Max: 165/74  SpO2  Min: 91 %  Max: 97 %      Intake/Output Summary (Last 24 hours) at 08/01/2022 1304  Last data filed at 08/01/2022 0859  Gross per 24 hour    Intake 40 ml   Output 2275 ml   Net -2235 ml          CARE PLAN        Problem: Moderate/High Fall Risk Score >5  Goal: Patient will remain free of falls  Outcome: Progressing  Flowsheets (Taken 08/01/2022 1050)  High (Greater than 13):   HIGH-Visual cue at entrance to patient's room   HIGH-Bed alarm on at all times while patient in bed   HIGH-Utilize chair pad alarm for patient while in the chair   HIGH-Apply yellow "Fall Risk" arm band   HIGH-Initiate use of floor mats as appropriate   HIGH-Consider use of low bed     Problem: Inadequate Airway Clearance  Goal: Patent Airway maintained  Outcome: Progressing  Flowsheets (Taken 07/29/2022 1755 by Lorriane Shire, RN)  Patent airway maintained:   Position patient for maximum ventilatory efficiency   Provide adequate fluid intake to liquefy secretions   Suction secretions as needed   Reinforce use of ordered respiratory interventions (i.e. CPAP, BiPAP, Incentive Spirometer, Acapella, etc.)   Reposition patient every 2 hours and as needed unless able to self-reposition     Problem: Impaired Mobility  Goal: Mobility/Activity is maintained at optimal level for patient  Outcome: Progressing  Flowsheets (Taken 07/29/2022 1755 by Lorriane Shire, RN)  Mobility/activity is maintained at optimal level for patient:   Consult/collaborate with Physical Therapy and/or Occupational Therapy   Encourage independent activity per ability     Problem: Constipation  Goal: Elimination patterns are normal or improving  Outcome: Progressing  Flowsheets (Taken 07/29/2022 1755 by Lorriane Shire, RN)  Elimination patterns are normal or improving: Assess for and discuss C. diff screening with LIP     Problem: Safety  Goal: Patient will be free from injury during hospitalization  Outcome: Progressing  Flowsheets (Taken 07/31/2022 0618 by Connye Burkitt, RN)  Patient will be free from injury during hospitalization: Include patient/ family/ care giver in decisions related to safety     Problem: Renal  Instability  Goal: Fluid and electrolyte balance are achieved/maintained  Outcome: Progressing  Flowsheets (Taken 07/29/2022 1755 by Lorriane Shire, RN)  Fluid and electrolyte balance are achieved/maintained:   Monitor/assess lab values and report abnormal values   Monitor for muscle weakness   Assess and reassess fluid and electrolyte status   Follow fluid restrictions/IV/PO parameters   Observe for cardiac arrhythmias     Problem: Inadequate Gas Exchange  Goal: Patent Airway maintained  Outcome: Progressing  Flowsheets (Taken 07/29/2022 1755 by Lorriane Shire, RN)  Patent airway maintained:   Position patient for maximum ventilatory efficiency   Provide adequate fluid intake to liquefy secretions   Suction secretions as needed   Reinforce use of ordered respiratory interventions (i.e. CPAP, BiPAP, Incentive Spirometer, Acapella, etc.)   Reposition patient every 2 hours and as needed unless able to self-reposition     Problem: Neurological Deficit  Goal: Neurological status is stable or improving  Outcome: Progressing  Flowsheets (Taken 07/29/2022 1755 by Lorriane Shire, RN)  Neurological status is stable or improving:   Monitor/assess/document neurological assessment (Stroke: every 4 hours)   Monitor/assess NIH Stroke Scale   Observe for seizure activity and initiate seizure precautions if indicated   Perform CAM Assessment   Re-assess NIH Stroke Scale for any change in status     Problem: Every Day - Stroke  Goal: Mobility/Activity is maintained at optimal level for patient  Outcome: Progressing  Flowsheets (Taken 07/29/2022 1755 by Lorriane Shire, RN)  Mobility/activity is maintained at optimal level for patient:   Consult/collaborate with Physical Therapy and/or Occupational Therapy   Encourage independent activity per ability  Goal: Elimination patterns are normal or improving  Outcome: Progressing  Flowsheets (Taken 07/29/2022 1755 by Lorriane Shire, RN)  Elimination patterns are normal or improving: Assess for and discuss  C. diff screening with LIP  Goal: Neurological status is stable or improving  Outcome: Progressing  Flowsheets (Taken 07/29/2022 1755 by Lorriane Shire, RN)  Neurological status is stable or improving:   Monitor/assess/document neurological assessment (Stroke: every 4 hours)   Monitor/assess NIH Stroke Scale   Observe for seizure activity and initiate seizure precautions if indicated   Perform CAM Assessment   Re-assess NIH Stroke Scale for any change in status  Goal: Core/Quality measure requirements - Daily  Outcome: Progressing  Flowsheets (Taken 07/29/2022 1755 by Lorriane Shire, RN)  Core/Quality measure requirements - Daily:   VTE Prevention: Ensure anticoagulant(s) administered and/or anti-embolism stockings/devices documented by end of day 2   Ensure antithrombotic administered or contraindication documented by LIP by end of day 2   Once lipid panel has resulted, check LDL. Contact provider for statin order if LDL > 70 (or ensure contraindication documented by LIP).   Continue stroke education (must include Modifiable Risk Factors, Warning Signs and Symptoms of Stroke, Activation of Emergency Medical System and Follow-up Appointments). Ensure handout has been given and documented.  Goal: Stable vital signs and fluid balance  Outcome: Progressing  Flowsheets (Taken 07/29/2022 1755 by Lorriane Shire, RN)  Stable vital signs and fluid balance:   Position patient for maximum circulation/cardiac output   Monitor and assess vitals every 4 hours or as ordered and hemodynamic parameters   Monitor intake and output. Notify LIP if urine output is < 30 mL/hour.   Encourage oral fluid intake   Apply telemetry monitor as ordered  Goal: Patient will maintain adequate oxygenation  Outcome: Progressing  Flowsheets (Taken 07/29/2022 1755 by Lorriane Shire, RN)  Patient will maintain adequate oxygenation:   Sleep Apnea: Assess if previously tested or on CPAP   Suction secretions as needed   Maintain SpO2 of greater than 92%   Sleep  Apnea: Encourage patient to speak to PCP regarding sleep apnea/sleep study ([place] only)  Goal: Patient's risk of aspiration will be minimized  Outcome: Progressing  Flowsheets (Taken 07/29/2022 1755 by Lorriane Shire, RN)  Patient's risk of aspiration will be minimized:   Complete new dysphagia screen for any change in status: Keep patient NPO if patient fails   Place swallow precaution signage above bed   Monitor/assess for signs of aspiration (tachypnea, cough, wheezing, clearing throat, hoarseness after eating, decrease in SaO2   Assess and monitor ability to swallow   Thicken liquids as recommended by Speech Pathologist   Order modified texture diet as recommend by Speech Pathologist   Keep head of bed up a minimum of 30 degrees when hemodynamically stable   Place patient up in chair to eat, if possible   Instruct patient to take small bites   Do not use a straw   Supervise patient during oral intake   Consult/Collaborate with Speech Pathologist for dysphagia   HOB up 90 degrees to eat if unable to be OOB  Goal: Nutritional intake is adequate  Outcome: Progressing  Flowsheets (Taken 07/29/2022 1755 by Lorriane Shire, RN)  Nutritional intake is adequate:   Consult/collaborate with Clinical Nutritionist   Consult/collaborate with Speech Therapy (swallow evaluations)  Goal: Skin integrity is maintained or improved  Outcome: Progressing  Flowsheets (Taken 07/29/2022 1755 by Lorriane Shire, RN)  Skin integrity is maintained or improved:   Assess Braden Scale every shift   Turn or reposition patient every 2 hours or as needed unless able to reposition self   Increase activity as tolerated/progressive mobility   Avoid shearing   Keep skin clean and dry   Monitor patient's hygiene practices   Utilize specialty bed   Keep head of bed 30 degrees or less (unless contraindicated)   Collaborate with Wound, Ostomy, and Continence Nurse   Encourage use of lotion/moisturizer on skin   Relieve pressure to bony prominences  Goal:  Neurovascular status is stable or improving  Outcome: Progressing  Flowsheets (Taken 07/29/2022 1755 by Lorriane Shire, RN)  Neurovascular  status is stable or improving:   Monitor/assess neurovascular status (pulses, capillary refill, pain, paresthesia, presence of edema)   Monitor/assess site of invasive procedure for signs of bleeding   Monitor/assess for signs of Venous Thrombus Emboli (edema of calf/thigh redness, pain)  Goal: Effective coping demonstrated  Outcome: Progressing  Flowsheets (Taken 07/29/2022 1755 by Lorriane Shire, RN)  Effective coping demonstrated:   Offer reassurance to decrease anxiety   Assess/report to LIP uncontrolled anxiety, depression, or ineffective coping  Goal: Will be able to express needs and understand communication  Outcome: Progressing  Flowsheets (Taken 07/29/2022 1755 by Lorriane Shire, RN)  Able to express needs and understand communication:   Provide alternative method of communication if needed   Consult/collaborate with Speech Language Pathology (SLP)   Consult/collaborate with Case Management/Social Work   Include patient care companion in decisions related to communication   Patient/patient care companion demonstrates understanding on disease process, treatment plan, medications and discharge plan     Problem: Everyday - Heart Failure  Goal: Mobility/Activity is Maintained at Optimal Level for Patient  Outcome: Progressing  Flowsheets (Taken 07/29/2022 1755 by Lorriane Shire, RN)  Mobility/Activity is Maintained at Optimal Level for Patient:   Perform active/passive ROM   Reposition patient every 2 hours and as needed unless able to reposition self   Increase mobility as tolerated/progressive mobility protocol   Maintain SCD's as Ordered   Consult with Physical Therapy and/or Occupational Therapy   Assess for changes in respiratory status, level of consciousness and/or development of fatigue  Goal: Nutritional Intake is Adequate  Outcome: Progressing  Flowsheets (Taken 07/29/2022  1755 by Lorriane Shire, RN)  Nutritional Intake is Adequate:   Cardiac diet-2 gm Sodium   Consult/Collaborate with Nutritionist   Assess appetite,anorexia and amount of meal/food tolerated   Encourage/perform oral hygiene as appropriate   Patient and family teaching on low sodium diet   Fluid Restricction if needed

## 2022-08-01 NOTE — Plan of Care (Signed)
NURSING SHIFT NOTE     Patient: Shelby Barron  Day: 16      SHIFT EVENTS     Shift Narrative/Significant Events (PRN med administration, fall, RRT, etc.):     - patient is AOX1 to self only, on room air, seems fatigue and exhausted   - Scheduled medications given and tolerated well  - Patient do not seems in distress  - Neuro check done  - Heparin drip started from 2048 as per MD order  - Wound care, incontinent care done.  - Wound vac is working well  - Frequent positioning and rounding done  - Anti Xa done per protocol, no any changes in rate seen. Heparin stooped at 5:00 A.M for the procedure as per Dr order    Safety and fall precautions remain in place. Purposeful rounding completed.          ASSESSMENT     Changes in assessment from patient's baseline this shift:    Neuro: No  CV: No  Pulm: No  Peripheral Vascular: No  HEENT: No  GI: No  BM during shift: No, Last BM: Last BM Date: 07/29/22  GU: No   Integ: No  MS: No    Pain: No change  Pain Interventions: Medications, Rest, and Positioning  Medications Utilized:     Mobility: PMP Activity: Step 3 - Bed Mobility of Distance Walked (ft) (Step 6,7): 0 Feet           Lines     Patient Lines/Drains/Airways Status       Active Lines, Drains and Airways       Name Placement date Placement time Site Days    Permacath Catheter - Tunneled 07/14/22 Subclavian vein catheter Right 07/14/22  1404  -- 17    Midline IV 07/01/22 Anterior;Left Upper Arm 07/01/22  1315  Upper Arm  30                         VITAL SIGNS     Vitals:    07/31/22 2312   BP: 133/71   Pulse: 74   Resp: 17   Temp: 99.1 F (37.3 C)   SpO2: 91%       Temp  Min: 98 F (36.7 C)  Max: 99.1 F (37.3 C)  Pulse  Min: 74  Max: 89  Resp  Min: 15  Max: 21  BP  Min: 117/75  Max: 185/78  SpO2  Min: 91 %  Max: 96 %      Intake/Output Summary (Last 24 hours) at 08/01/2022 0100  Last data filed at 07/31/2022 1420  Gross per 24 hour   Intake 475 ml   Output 2275 ml   Net -1800 ml                  CARE PLAN         Problem: Moderate/High Fall Risk Score >5  Goal: Patient will remain free of falls  Outcome: Progressing  Flowsheets  Taken 07/31/2022 0618 by Connye Burkitt, RN  VH Moderate Risk (6-13): YELLOW "FALL RISK" ARM BAND  Taken 07/30/2022 2125 by Connye Burkitt, RN  High (Greater than 13): HIGH-Apply yellow "Fall Risk" arm band  Taken 07/29/2022 1755 by Lorriane Shire, RN  VH High Risk (Greater than 13):   RED "HIGH FALL RISK" SIGNAGE   BED ALARM WILL BE ACTIVATED WHEN THE PATEINT IS IN BED WITH SIGNAGE "RESET BED ALARM"   A CHAIR  PAD ALARM WILL BE USED WHEN PATIENT IS UP SITTING IN A CHAIR   A safety companion may be used when deemed appropriate by the Primary RN and Clinical Administrator   PATIENT IS TO BE SUPERVISED FOR ALL TOILETING ACTIVITIES   Keep door open for better visibility   Include family/significant other in multidisciplinary discussion regarding plan of care as appropriate   Request PT/OT therapy consult order from physician for patients with gait/mobility impairment   Use assistive devices   Use chair-pad alarm device   Use of floor mat   Use of roll guard  Taken 07/27/2022 0800 by Lavina Hamman, RN  Moderate Risk (6-13): MOD-Consider activation of bed alarm if appropriate     Problem: Compromised Sensory Perception  Goal: Sensory Perception Interventions  Outcome: Progressing  Flowsheets (Taken 07/31/2022 0800 by Quinn Axe Melody, RN)  Sensory Perception Interventions: Offload heels, Pad bony prominences, Reposition q 2hrs/turn Clock, Q2 hour skin assessment under devices if present     Problem: Compromised Moisture  Goal: Moisture level Interventions  Outcome: Progressing  Flowsheets (Taken 07/31/2022 0800 by Quinn Axe Melody, RN)  Moisture level Interventions: Moisture wicking products, Moisture barrier cream     Problem: Compromised Friction/Shear  Goal: Friction and Shear Interventions  Outcome: Progressing  Flowsheets (Taken 07/30/2022 2124 by Connye Burkitt, RN)  Friction and Shear  Interventions: Pad bony prominences, Off load heels, HOB 30 degrees or less unless contraindicated, Consider: TAP seated positioning, Heel foams     Problem: Pain  Goal: Pain at adequate level as identified by patient  Outcome: Progressing  Flowsheets (Taken 07/31/2022 0618 by Connye Burkitt, RN)  Pain at adequate level as identified by patient:   Reassess pain within 30-60 minutes of any procedure/intervention, per Pain Assessment, Intervention, Reassessment (AIR) Cycle   Evaluate if patient comfort function goal is met   Evaluate patient's satisfaction with pain management progress     Problem: Pain interferes with ability to perform ADL  Goal: Pain at adequate level as identified by patient  Outcome: Progressing  Flowsheets (Taken 07/31/2022 0618 by Connye Burkitt, RN)  Pain at adequate level as identified by patient:   Reassess pain within 30-60 minutes of any procedure/intervention, per Pain Assessment, Intervention, Reassessment (AIR) Cycle   Evaluate if patient comfort function goal is met   Evaluate patient's satisfaction with pain management progress

## 2022-08-01 NOTE — Progress Notes (Signed)
[facility]  PROGRESS NOTE      Patient: Shelby Barron  Date: 08/01/2022   LOS: 34 Days  Admission Date: 06/28/2022   MRN: [id number]  Attending: Melina Fiddler, MD  When on service as the attending, please contact me on Epic Secure Chat from 7 AM - 7 PM for non-urgent issues. For urgent matters use XTend page from 7 AM - 7 PM.       ASSESSMENT/PLAN     Shelby Barron is a 82 y.o. female admitted with Bladder cancer    Toxic metabolic Encephalopathy/delirium resolving  -On and off confusion alert oriented x 2 today to self and place  - Patient was upgraded to ICU on 6/30 for lethargy and decreased responsiveness  - CT head was repeated on 6/29, no acute finding  - ABG consistent with respiratory alkalosis  - EEG consistent with encephalopathy no seizure, MRI no acute stroke  - CT abdomen with and without contrast after premedication did not show any leak or abscess  - SLP advanced diet to pureed.   -NG tube removed 7/9  -SLP following daily  - Palliative consulted:  Family is clear they would like no limits to care and patient should remain full code.     Anemia, 2/2 CKD and blood loss during OR  H&H 6.8/21  Will transfuse 1 units of PRBC during hemodialysis today  - TSAT 18%  - continue Aranesp per nephrology  - Trend CBC, transfuse Hgb<7. .  No active signs of bleeding    s/p open cystectomy, bilateral nephrectomies, and resection of small bowel after intra-op enterotomy    Mixed shock - hemorrhagic vs septic: Resolved  Leukocytosis  ?  Necrotic wound tissue  -I reviewed the images from last wound care note on July 8 which showed some necrotic tissue    -I discussed with general surgery who agreed the patient needs debridement but needs to hold Eliquis for 2 days prior  Will hold Eliquis starting now and switch to heparin drip currently on hold pending debridement at bedside by general surgery team  Plan for bedside wound debridement 7/13 however patient and family preferred to reschedule the procedure  till Monday.  Bedside wound debridement on Monday by general surgery team along with wound VAC changes  Heparin to be held early morning tomorrow  I discussed with ID and plan for short course of antibiotic with debridement.  Short course of meropenem  - 6/10 small bowel enteretomy with 1L EBL. Went to the OR on June 12 for small bowel anastomosis, closure of mesenteric defect, closure of abdominal wall with wound VAC application. Wound VAC last changed on 7/11  - Surgery consulted, recommend CTAP with PO contrast that showed no anastomotic leak. Low concern for abscesses as they'd expect these to have a more loculated appearance in the interim between 6/17 and 7/1 imaging  - ID consulted, completed meropenem and Mycamine for 2 weeks on 7/3.         Acute CVA on the right frontal lobe most likely from hypotension and shock  History of CVA  Petechial hemorrhage vs cortical laminar necrosis on CTH  - Left upper extremity not moving at all unclear duration  - Neurology following, initially started on ASA 81mg  only due to concern for risk of hemorrhagic conversion. Now OK to switch to Eliquis for Afib and discontinue aspirin per neurology on 7/5 as > 10d post event.  - SBP goal 120-160  - Carotid Doppler  showed plaque with no critical stenosis  - Head CT on 7/7 w/ a few areas of cortical hyperdensity - petechial hemorrhage vs cortical laminar necrosis. Discussed with neuro OK to continue eliquis for now. Plan for repeat CTH today. Neuro exam stable.  - EEG encephalopathic pattern no seizure, repeat MRI no new infarction  - PT OT recommend SNF  -Repeat CT head on 07/26/2022 stable     Acute hypoxemic respiratory failure resolved  COPD  Extubated on 6/13, currently on room air  Maintain SaO2 > 92%  Continue as needed neb treatment  X-ray repeated on 6/29 showed resolution of interstitial edema     ESRD  S/p CRRT this admission, now on HD TTS  Continue dialysis per nephrology  IR removed the nonfunctional permacath and right  [place] IJ cath and replaced with a new permacath on 6/26.     HTN  On metoprolol   PRN IV labetalol for SBP > 180      New onset Afib with RVR, improved  - CHA2DS2-VASc score is 7  - Status post amiodarone drip. Now stable on oral amiodarone and coreg per cardiology recommendations  - GI and general surgery cleared for anticoagulation. Neurology recommended to hold anticoagulation initiation for 10 days given the size of stroke.   -  started on Eliquis since 07/23/22  Hold Eliquis starting today 7/10 for 2 days prior to wound debridement     Renal cell carcinoma outpatient follow-up hematology oncology  Urothelial carcinoma  Thrombocytopenia  - VTE ppx: SCDs, eliquis     Diabetes Mellitus type 2  -Sliding scale insulin  Blood glucose was 90 early morning acting insulin to be hold for now                     Nutrition: Pured diet as tolerated  Malnutrition Documentation    Moderate Malnutrition related to inadequate nutritional intake in the setting of acute illness as evidenced by <50% EER > 5 days, mild muscle losses (temporalis, deltoid) - new                 Patient has BMI=Body mass index is 34.52 kg/m.  Diagnosis: Obesity based on BMI criteria                   Recent Labs   Lab 08/01/22  0320 07/31/22  0754 07/31/22  0449   Hemoglobin 9.4* 7.2* 6.8*   Hematocrit 28.0* 22.5* 21.4*   MCV 89.5 91.5 91.8   WBC 11.72* 11.91* 11.63*   Platelet Count 300 345 333     Recent Labs   Lab 07/18/22  0421 06/02/22  0514   Ferritin 2,300.21*  --    Iron 18* 62   TIBC 99* 145*   Iron Saturation 18 43     Anemia Diagnosis: Already documented in note    Code Status: Full Code    Dispo: SNF    Family Contact: Daughter [name]     DVT Prophylaxis:   Current Facility-Administered Medications (Includes Only Anticoagulants, Misc. Hematological)   Medication Dose Route Last Admin    heparin 25,000 units in dextrose 5% 500 mL infusion (premix)  11 Units/kg/hr Intravenous 11 Units/kg/hr at 08/01/22 0824          CHART  REVIEW &  DISCUSSION     The following chart items were reviewed as of 12:04 PM on 08/01/22:  [x]  Lab Results [x]  Imaging Results   [x]  Problem List  [x]   Current Orders [x]  Current Medications  []  Allergies  [x]  Code Status [x]  Previous Notes   []  SDoH    The management and plan of care for this patient was discussed with the following specialty consultants:  []  Cardiology  []  Gastroenterology                 [x]  Infectious Disease  []  Pulmonology []  Neurology                [x]  Nephrology  []  Neurosurgery []  Orthopedic Surgery  []  Heme/Onc  [x]  General Surgery []  Psychiatry                                   []  Palliative    SUBJECTIVE     [name] alert oriented x 2 to self and place she stated she feels well denies any complaints she knows that she is in the hospital because she had a stroke no fever no chills no abdominal pain at the moment  MEDICATIONS     Current Facility-Administered Medications   Medication Dose Route Frequency    acetaminophen  650 mg Oral 4 times per day    Or    acetaminophen  650 mg Oral 4 times per day    amiodarone  200 mg per NG tube Daily    atorvastatin  40 mg Oral QHS    carvedilol  12.5 mg Oral Q12H SCH    darbepoetin alfa  0.45 mcg/kg Subcutaneous Weekly    glycopyrrolate  0.2 mg Intravenous Q8H    insulin glargine  5 Units Subcutaneous QAM    insulin lispro  1-3 Units Subcutaneous QHS    insulin lispro  1-5 Units Subcutaneous TID AC    lidocaine  20 mL Infiltration Once    meropenem  500 mg Intravenous Q24H    pantoprazole  40 mg per NG tube Q12H [facility]    thiamine  100 mg per NG tube Daily    umeclidinium-vilanterol  1 puff Inhalation QAM    vitamins/minerals  1 tablet Oral Daily       PHYSICAL EXAM     Vitals:    08/01/22 1125   BP: 145/74   Pulse: 75   Resp: 21   Temp: 98.4 F (36.9 C)   SpO2: 97%       Temperature: Temp  Min: 97.9 F (36.6 C)  Max: 99.1 F (37.3 C)  Pulse: Pulse  Min: 74  Max: 89  Respiratory: Resp  Min: 15  Max: 23  Non-Invasive BP: BP  Min: 117/75  Max:  174/77  Pulse Oximetry SpO2  Min: 91 %  Max: 97 %    Intake and Output Summary (Last 24 hours) at Date Time    Intake/Output Summary (Last 24 hours) at 08/01/2022 1204  Last data filed at 08/01/2022 0859  Gross per 24 hour   Intake 365 ml   Output 2275 ml   Net -1910 ml         GEN APPEARANCE: Alert, awake and oriented x 1  HEENT: PERLA; EOMI; Conjunctiva Clear  NECK: Supple; No bruits  CVS: RRR, S1, S2; No M/G/R  LUNGS: CTAB; , no Rhonchi: No rales  ABD: Soft; No TTP; + Normoactive BS vertical abdominal wound and wound VAC in place  EXT: No edema; Pulses 2+ and intact  NEURO: moving right upper extremity spontaneously, able to move lower extremities for  pain but minimal, no movement at all on the left upper extremity      LABS     Recent Labs   Lab 08/01/22  0320 07/31/22  0754 07/31/22  0449   WBC 11.72* 11.91* 11.63*   RBC 3.13* 2.46* 2.33*   Hemoglobin 9.4* 7.2* 6.8*   Hematocrit 28.0* 22.5* 21.4*   MCV 89.5 91.5 91.8   Platelet Count 300 345 333       Recent Labs   Lab 08/01/22  0320 07/31/22  0449 07/30/22  0317 07/29/22  0350 07/28/22  0537   Sodium 140 136 137 142 143   Potassium 4.3 4.7 4.2 4.4 3.9   Chloride 103 99 100 103 103   CO2 25 24 26 25 25    BUN 23* 43* 31* 59* 41*   Creatinine 3.2* 4.5* 3.2* 5.3* 3.9*   Glucose 94 82 85 105* 81   Calcium 8.4 8.6 8.5 9.5 8.7   Magnesium 2.0 1.9 1.8 2.1 1.9       Recent Labs   Lab 08/01/22  0320   ALT 10   AST (SGOT) 16   Bilirubin, Total 0.3   Albumin 1.7*   Alkaline Phosphatase 100             Recent Labs   Lab 08/01/22  0408 08/01/22  0320 07/31/22  0449   PTT 48* 56* 52*       Microbiology Results (last 15 days)       Procedure Component Value Units Date/Time    Culture, Blood, Aerobic And Anaerobic [[id number]] Collected: 07/18/22 1400    Order Status: Completed Specimen: Blood, Venous Updated: 07/23/22 2200     Culture Blood No growth at 5 days    Culture, Blood, Aerobic And Anaerobic [[id number]] Collected: 07/18/22 1400    Order Status: Completed Specimen:  Blood, Venous Updated: 07/23/22 2200     Culture Blood No growth at 5 days             RADIOLOGY     CT Head WO Contrast    Result Date: 07/26/2022   1.No significant change in multifocal areas of subacute infarction within bilateral cerebral hemispheres. 2.Small areas of petechial hemorrhage in the left parietal and occipital lobes are unchanged. No new hemorrhage or measurable parenchymal hematoma. 3.Persistent complete opacification of the right mastoid air cells and middle ear cavity. Garnette Gunner, MD 07/26/2022 12:27 PM    CT Head WO Contrast    Result Date: 07/25/2022   1.Redemonstration of multifocal areas of bilateral acute infarctions. A few areas of cortical hyperdensity likely represent petechial hemorrhage. No mass occupying hematoma. No significant mass effect or midline shift. 2.Redemonstrated right mastoid and right middle ear cavity effusion with areas of bony thinning versus dehiscence of the right tegmen tympani and right tegmen mastoideum. Vara Guardian, MD 07/25/2022 2:19 PM    CT Abdomen Pelvis W IV And PO Cont    Result Date: 07/19/2022   1.Slight interval increase in left-sided retroperitoneal fluid collection with the other abdominal fluid collections appearing relatively stable. 2. There is no definite abscess or acute inflammatory process. 3. Incidental note is made of a small paraesophageal type hernia. 4. Sigmoid diverticulosis is noted without CT evidence of diverticulitis. 5. Other findings as noted above. Miguel Dibble, MD 07/19/2022 6:36 AM    MRI Brain WO Contrast    Result Date: 07/18/2022   Interval evolution of multifocal recent infarcts in the bilateral cerebral hemispheres. No new infarct. Milan Kincora  Manchandia, MD 07/18/2022 5:57 PM    XR Chest AP Portable    Result Date: 07/17/2022  1.Near-complete resolution of interstitial edema. 2.Improving pulmonary vascular congestion. Legrand Pitts, MD 07/17/2022 2:38 PM    CT Head WO Contrast    Result Date: 07/17/2022   1.No new acute intracranial  pathology. 2.There are multiple regions of cytotoxic edema in the right > left frontal and left > right occipital lobes, consistent with known early subacute infarcts. Dannielle Burn 07/17/2022 8:26 AM    US Carotid Duplex Dopp Comp Bilateral    Result Date: 07/15/2022   1. Plaque formation in the right internal carotid artery bulb with elevated velocities. Stenosis is estimated at 50-69%. 2. Plaque in the left internal carotid artery with normal velocities. Any stenosis is less than 50%. Criteria for stenosis and velocity parameters are based on the guidelines of the SRU (Society of Radiologists in Ultrasound). Grant et al: Carotid Artery Stenosis: Grayscale and Doppler Ultrasound Diagnosis-- Society of Radiologists in Ultrasound Consensus Conference Ultrasound Quarterly. Volume 19, Number 4; December, 2003. Recommendations updated October 2021 Verne Carrow is an [facility] accredited facility Suszanne Finch, MD 07/15/2022 9:41 AM    Tunneled Cath Removal (Permcath)    Result Date: 07/14/2022  1. Nonfunctioning tunneled dialysis catheter as described above. 2. Successful placement of a new 19 cm Equistream catheter as described above. Dimitrios Papadouris, MD 07/14/2022 4:06 PM    Tunneled Cath Placement (Permcath)    Result Date: 07/14/2022  1. Nonfunctioning tunneled dialysis catheter as described above. 2. Successful placement of a new 19 cm Equistream catheter as described above. Dimitrios Papadouris, MD 07/14/2022 4:06 PM    MRI Brain WO Contrast    Result Date: 07/13/2022   1.Patchy areas of acute/recent infarction involving the cerebral hemispheres bilaterally. The infarcts have a configuration suggesting watershed territory infarcts. 2.Chronic ischemic changes as discussed. Georgann Housekeeper, MD 07/13/2022 10:28 PM    CT Head WO Contrast    Result Date: 07/13/2022   1.New region of loss of gray-white matter differentiation in the right frontal lobe, concerning for acute infarction. 2.A few additional areas of poor gray-white matter  differentiation for example in the left occipital lobe may be artifactual but could be correlated with MRI. 3.No evidence of acute intracranial hemorrhage or significant mass effect. These urgent results were discussed with and acknowledged by Dr. Don Broach on 07/13/2022 3:20 PM. Vara Guardian, MD 07/13/2022 3:21 PM    XR Chest AP Portable    Result Date: 07/10/2022  Interstitial markings have slightly increased, suggesting minimal edema. No other change. Wilmon Pali, MD 07/10/2022 4:49 PM    XR Chest AP Portable    Result Date: 07/07/2022   There is mild interstitial prominence suggesting mild congestive heart failure/fluid overload in the setting of shortness of breath [name], MD 07/07/2022 12:32 PM    XR Chest AP Portable    Result Date: 07/06/2022   Hypoventilation with basilar atelectasis. Charlott Rakes, MD 07/06/2022 3:35 PM    CT Abdomen Pelvis W IV And PO Cont    Result Date: 07/06/2022  1.Postoperative changes from bilateral nephrectomy and cystectomy. 2.Small amount of ascites including some areas which appear loculated. Small collections of fluid and gas in the anterior midline abdomen may be postoperative but superimposed infection is not excluded. No rim-enhancing fluid collections or extravasation of oral contrast to suggest anastomotic leak. Demetrios Isaacs, MD 07/06/2022 5:51 AM    CT Head WO Contrast    Result Date: 07/06/2022  1.No acute intracranial abnormality is seen. Leandro Reasoner, MD 07/06/2022 3:56 AM    XR Abdomen AP    Result Date: 07/05/2022  Enteric tube terminates in the distal stomach. Elease Etienne, DO 07/05/2022 12:24 PM   Echo Results       None          Results for orders placed or performed during the hospital encounter of 06/28/22   MRI Brain WO Contrast    Narrative    HISTORY: Declining mental status, right gaze deviation.     COMPARISON: CT head 07/17/2022 and MRI brain 07/13/2022.     TECHNIQUE: MRI of the brain performed on a 1.5 Tesla scanner without  intravenous contrast.    CONTRAST:  None.    FINDINGS:     Interval evolution of multifocal recent infarcts with associated edema in  the bilateral cerebral hemispheres. No new infarct. Mild chronic small  vessel ischemic changes within the cerebral white matter. Mild generalized  cerebral volume loss.     No acute hemorrhage, extra-axial fluid collection, or midline shift. No  mass lesion or vasogenic edema.     Normal appearance of the sella. Cerebellar tonsils are appropriately  positioned. No acute hydrocephalus. Patent basal cisterns. Skull base flow  voids are intact.    No osseous abnormality. No acute orbital abnormality. Status post bilateral  lens replacement. No significant paranasal sinus mucosal disease. Severe  opacification of the right mastoid air cells and middle ear cavity. Mild  opacification of left mastoid air cells.       Impression         Interval evolution of multifocal recent infarcts in the bilateral cerebral  hemispheres. No new infarct.    Milan Waunita Schooner, MD  07/18/2022 5:57 PM   CT Head WO Contrast    Narrative    HISTORY: Petechial hemorrhage follow up.     COMPARISON: Multiple priors most recent CT 07/25/2022    TECHNIQUE: CT of the head performed without intravenous contrast. The  following dose reduction techniques were utilized: automated exposure  control and/or adjustment of the mA and/or KV according to patient size,  and the use of an iterative reconstruction technique.    CONTRAST: None.    FINDINGS:  Multifocal areas of subacute infarction within bilateral cerebral  hemispheres remain similar to prior. Small areas of petechial hemorrhage in  the left parietal and occipital lobes are unchanged. No new hemorrhage or  measurable parenchymal hematoma.    There is a background moderate chronic microangiopathic changes in the  periventricular white matter. Moderate generalized volume loss. No  extra-axial fluid collection or midline shift. Ventricles and basal  cisterns are patent.    Persistent complete  opacification of the right mastoid air cells and middle  ear cavity. Paranasal sinuses are clear.      Impression       1.No significant change in multifocal areas of subacute infarction within  bilateral cerebral hemispheres.  2.Small areas of petechial hemorrhage in the left parietal and occipital  lobes are unchanged. No new hemorrhage or measurable parenchymal hematoma.  3.Persistent complete opacification of the right mastoid air cells and  middle ear cavity.    Garnette Gunner, MD  07/26/2022 12:27 PM       Signed,  Melina Fiddler, MD  12:04 PM 08/01/2022

## 2022-08-01 NOTE — Progress Notes (Signed)
08/01/22 1600   Volunteer Chaplain   Visit Type Follow-up   Source Chaplain Initiated   Present at Visit Patient;Family members   Spiritual Care Provided to patient   Reason for Visit Spiritual Support   Spiritual Care Interventions Provided reflective and compassionate listening;Provided prayer;Provided spiritual encouragement   Spiritual Care Outcomes Patient expressed appreciation of visit   Length of Visit 0-15 minutes   Follow-up No follow-up at this time

## 2022-08-02 ENCOUNTER — Inpatient Hospital Stay: Payer: No Typology Code available for payment source

## 2022-08-02 LAB — ARTERIAL BLOOD GAS
Arterial Base Excess: 2.2 mEq/L (ref <=2.7)
Arterial CO2: 23.7 mEq/L (ref 23.0–30.0)
Arterial HCO3: 25.4 mEq/L (ref 23.0–28.0)
Arterial O2 Saturation: 96.5 % (ref 94.0–98.0)
Arterial pCO2: 35.4 mmHg (ref 32.0–48.0)
Arterial pH: 7.47 — ABNORMAL HIGH (ref 7.350–7.450)
Arterial pO2: 76.5 mmHg — ABNORMAL LOW (ref 83.0–108.0)
FIO2: 21 %
Temperature: 37 C

## 2022-08-02 LAB — COMPREHENSIVE METABOLIC PANEL
ALT: 9 U/L (ref 0–55)
AST (SGOT): 12 U/L (ref 5–41)
Albumin/Globulin Ratio: 0.6 — ABNORMAL LOW (ref 0.9–2.2)
Albumin: 1.6 g/dL — ABNORMAL LOW (ref 3.5–5.0)
Alkaline Phosphatase: 93 U/L (ref 37–117)
Anion Gap: 15 (ref 5.0–15.0)
BUN: 34 mg/dL — ABNORMAL HIGH (ref 7–21)
Bilirubin, Total: 0.2 mg/dL (ref 0.2–1.2)
CO2: 23 mEq/L (ref 17–29)
Calcium: 8.5 mg/dL (ref 7.9–10.2)
Chloride: 101 mEq/L (ref 99–111)
Creatinine: 4.6 mg/dL — ABNORMAL HIGH (ref 0.4–1.0)
GFR: 8.8 mL/min/{1.73_m2} — ABNORMAL LOW (ref >=60.0)
Globulin: 2.8 g/dL (ref 2.0–3.6)
Glucose: 99 mg/dL (ref 70–100)
Potassium: 4.5 mEq/L (ref 3.5–5.3)
Protein, Total: 4.4 g/dL — ABNORMAL LOW (ref 6.0–8.3)
Sodium: 139 mEq/L (ref 135–145)

## 2022-08-02 LAB — CBC
Absolute nRBC: 0 10*3/uL (ref <=0.00)
Hematocrit: 27.1 % — ABNORMAL LOW (ref 34.7–43.7)
Hemoglobin: 8.8 g/dL — ABNORMAL LOW (ref 11.4–14.8)
MCH: 29.4 pg (ref 25.1–33.5)
MCHC: 32.5 g/dL (ref 31.5–35.8)
MCV: 90.6 fL (ref 78.0–96.0)
MPV: 9.4 fL (ref 8.9–12.5)
Platelet Count: 270 10*3/uL (ref 142–346)
RBC: 2.99 10*6/uL — ABNORMAL LOW (ref 3.90–5.10)
RDW: 15 % (ref 11–15)
WBC: 13.08 10*3/uL — ABNORMAL HIGH (ref 3.10–9.50)
nRBC %: 0 /100 WBC (ref <=0.0)

## 2022-08-02 LAB — WHOLE BLOOD GLUCOSE POCT
Whole Blood Glucose POCT: 106 mg/dL — ABNORMAL HIGH (ref 70–100)
Whole Blood Glucose POCT: 110 mg/dL — ABNORMAL HIGH (ref 70–100)
Whole Blood Glucose POCT: 105 mg/dL — ABNORMAL HIGH (ref 70–100)
Whole Blood Glucose POCT: 137 mg/dL — ABNORMAL HIGH (ref 70–100)
Whole Blood Glucose POCT: 118 mg/dL — ABNORMAL HIGH (ref 70–100)

## 2022-08-02 LAB — APTT: PTT: 83 s — ABNORMAL HIGH (ref 27–39)

## 2022-08-02 LAB — ANTI-XA,UFH: Anti-Xa, UFH: 0.59 IU/mL

## 2022-08-02 LAB — PHOSPHORUS: Phosphorus: 3.7 mg/dL (ref 2.3–4.7)

## 2022-08-02 LAB — MAGNESIUM: Magnesium: 1.9 mg/dL (ref 1.6–2.6)

## 2022-08-02 MED ORDER — HYDROMORPHONE HCL 1 MG/ML IJ SOLN
1.0000 mg | INTRAMUSCULAR | Status: DC | PRN
Start: 2022-08-02 — End: 2022-08-11
  Administered 2022-08-02 – 2022-08-07 (×4): 1 mg via INTRAVENOUS
  Administered 2022-08-09: 0.5 mg via INTRAVENOUS
  Filled 2022-08-02 (×4): qty 1

## 2022-08-02 MED ORDER — LIDOCAINE HCL 1 % IJ SOLN
20.0000 mL | Freq: Once | INTRAMUSCULAR | Status: DC
Start: 2022-08-02 — End: 2022-08-18
  Filled 2022-08-02 (×6): qty 20

## 2022-08-02 MED ORDER — APIXABAN 2.5 MG PO TABS
2.5000 mg | ORAL_TABLET | Freq: Two times a day (BID) | ORAL | Status: DC
Start: 2022-08-02 — End: 2022-08-18
  Administered 2022-08-02 – 2022-08-18 (×30): 2.5 mg via ORAL
  Filled 2022-08-02 (×33): qty 1

## 2022-08-02 NOTE — Progress Notes (Signed)
GENERAL SURGERY PROGRESS NOTE  [facility]    Date Time: 08/02/22 12:07 PM  Patient Name: [facility] Day: 35    ASSESSMENT:     Patient Active Problem List   Diagnosis    Type 2 diabetes mellitus with kidney complication, with long-term current use of insulin    Diabetes mellitus type II, uncontrolled    Neuropathy of hand    Left renal mass    Hamstring injury    Peritonitis    ESRD (end stage renal disease)    Chronic diastolic congestive heart failure    Aortic atherosclerosis    Malignant neoplasm of urinary bladder, unspecified site    Malignant tumor of kidney, left    Benign essential hypertension    GERD (gastroesophageal reflux disease)    Leukocytosis    Bladder cancer    Anemia    COPD (chronic obstructive pulmonary disease)     11F s/p bilateral open radical nephrectomy, cystectomy, and small bowel resection for enterotomy during procedure. Recovering well overall but with concern for unhealthy tissue in midline wound observed during wound vac changes. Will proceed with bedside debridement today together with wound vac change.    PLAN:   -- Bedside debridement of midline wound with wound vac change    SUBJECTIVE:   Still with abdominal pain. Leukocytosis that is oscillating though afebrile. Remains interactive in conversation.     Objective:   Current Vitals:   Vitals:    08/02/22 0737   BP: 164/61   Pulse: 73   Resp: 22   Temp: 98.8 F (37.1 C)   SpO2: 96%       Intake and Output Summary (Last 24 hours):  I/O last 3 completed shifts:  In: 140 [P.O.:120; I.V.:20]  Out: 0     Labs:     Results       Procedure Component Value Units Date/Time    Whole Blood Glucose POCT [[id number]]  (Abnormal) Collected: 08/02/22 0739    Specimen: Blood, Capillary Updated: 08/02/22 0743     Whole Blood Glucose POCT 106 mg/dL     APTT [[id number]]  (Abnormal) Collected: 08/02/22 0527    Specimen: Blood, Venous Updated: 08/02/22 0642     PTT 83 sec     Anti-Xa, UFH [[id number]]  (Normal)  Collected: 08/02/22 0527    Specimen: Blood, Venous Updated: 08/02/22 0641     Anti-Xa, UFH 0.59 IU/mL     Magnesium [[id number]]  (Normal) Collected: 08/02/22 0527    Specimen: Blood, Venous Updated: 08/02/22 0615     Magnesium 1.9 mg/dL     Phosphorus [[id number]]  (Normal) Collected: 08/02/22 0527    Specimen: Blood, Venous Updated: 08/02/22 0615     Phosphorus 3.7 mg/dL     Comprehensive Metabolic Panel [[id number]]  (Abnormal) Collected: 08/02/22 0527    Specimen: Blood, Venous Updated: 08/02/22 0615     Glucose 99 mg/dL      BUN 34 mg/dL      Creatinine 4.6 mg/dL      Sodium 536 mEq/L      Potassium 4.5 mEq/L      Chloride 101 mEq/L      CO2 23 mEq/L      Calcium 8.5 mg/dL      Anion Gap 64.4     GFR 8.8 mL/min/1.73 m2      AST (SGOT) 12 U/L      ALT 9 U/L      Alkaline Phosphatase 93  U/L      Albumin 1.6 g/dL      Protein, Total 4.4 g/dL      Globulin 2.8 g/dL      Albumin/Globulin Ratio 0.6     Bilirubin, Total 0.2 mg/dL     CBC without Differential [[id number]]  (Abnormal) Collected: 08/02/22 0527    Specimen: Blood, Venous Updated: 08/02/22 0538     WBC 13.08 x10 3/uL      Hemoglobin 8.8 g/dL      Hematocrit 40.9 %      Platelet Count 270 x10 3/uL      MPV 9.4 fL      RBC 2.99 x10 6/uL      MCV 90.6 fL      MCH 29.4 pg      MCHC 32.5 g/dL      RDW 15 %      nRBC % 0.0 /100 WBC      Absolute nRBC 0.00 x10 3/uL     Whole Blood Glucose POCT [[id number]]  (Abnormal) Collected: 08/01/22 2118    Specimen: Blood, Capillary Updated: 08/01/22 2129     Whole Blood Glucose POCT 149 mg/dL     Culture, Sputum and Lower Respiratory [[id number]]  (Abnormal) Collected: 07/04/22 1034    Specimen: Sputum, Suctioned Updated: 08/01/22 1716     Culture Respiratory Heavy growth of Mixed upper respiratory flora      Light growth of Gram negative rod      Heavy growth of Mycelial Fungus, Mold     Gram Stain Few WBCs      Rare Squamous epithelial cells      Few Mixed respiratory flora    Whole Blood Glucose POCT [[id number]]  (Abnormal)  Collected: 08/01/22 1616    Specimen: Blood, Capillary Updated: 08/01/22 1617     Whole Blood Glucose POCT 127 mg/dL     APTT [[id number]]  (Abnormal) Collected: 08/01/22 1230    Specimen: Blood, Venous Updated: 08/01/22 1246     PTT 52 sec     Anti-Xa, UFH [[id number]]  (Normal) Collected: 08/01/22 1230    Specimen: Blood, Venous Updated: 08/01/22 1244     Anti-Xa, UFH 0.36 IU/mL             Micro:   None     Rads:     Radiology Results (24 Hour)       ** No results found for the last 24 hours. **              Medications:     Current Facility-Administered Medications   Medication Dose Route Frequency    acetaminophen  650 mg Oral 4 times per day    Or    acetaminophen  650 mg Oral 4 times per day    amiodarone  200 mg per NG tube Daily    atorvastatin  40 mg Oral QHS    carvedilol  12.5 mg Oral Q12H [facility]    darbepoetin alfa  0.45 mcg/kg Subcutaneous Weekly    glycopyrrolate  0.2 mg Intravenous Q8H    insulin glargine  5 Units Subcutaneous QAM    insulin lispro  1-3 Units Subcutaneous QHS    insulin lispro  1-5 Units Subcutaneous TID AC    lidocaine  20 mL Intradermal Once    pantoprazole  40 mg per NG tube Q12H [facility]    thiamine  100 mg per NG tube Daily    umeclidinium-vilanterol  1 puff Inhalation QAM    vitamins/minerals  1 tablet Oral Daily      heparin infusion 25,000 units/500 mL (Cardiac/Low Intensity) Stopped (08/02/22 0504)     sodium chloride, acetaminophen, albuterol-ipratropium, benzocaine, dextrose **OR** dextrose **OR** dextrose **OR** glucagon (rDNA), diphenhydrAMINE, hydrALAZINE, HYDROmorphone, labetalol, lidocaine 2% jelly, naloxone, ondansetron, oxyCODONE, phenol     Physical Exam:     General appearance - alert, well appearing, and in no distress  Mental status - alert, oriented to person  Chest - clear to auscultation, no wheezes, rales or rhonchi, symmetric air entry  Heart - normal rate, regular rhythm  Abdomen - soft, non-distended, mildly tender around incision  Extremities - no clubbing or  cyanosis    Signed by: Jetty Duhamel, MD  PGY-4 Surgery

## 2022-08-02 NOTE — Procedures (Signed)
Bedside Wound Debridement Note:  Procedure, including indications and risks / benefits, discussed with patient and patient's daughter, [name]. Consent obtained from [place] given patient's fluctuating mental status.    Wound vac removed. Unhealthy tissue sharply debrided from right inferior aspect of wound along with fibrinous exudate. Healthy tissue with good perfusion noted. Remainder of wound granulating well. Good hemostasis achieved.    Kinnie Scales. Deno Etienne, MD  PGY-4 Surgery

## 2022-08-02 NOTE — PT Progress Note (Signed)
Physical Therapy Note    [facility]  Physical Therapy Attempt Note    Patient:  Shelby Barron MRN#:  [id number]  Unit:  [facility] [address] [facility] Room/Bed:  A2130/Q6578.A      Physical Therapy treatment attempted on 08/02/2022 at 1:50 PM.     PT Visit Cancellation Reason: Patient sleeping - cannot wake NICU/Peds (family in room stating the patient is still tired from the wound deb unable to wake up)         Physical therapy will follow up at a later time/date as able.     RN aware.   Shirley Muscat, LPTA   [facility] # [id number]  Physical medicine and Rehabilitation   [facility]   [phone]

## 2022-08-02 NOTE — Plan of Care (Signed)
NURSING SHIFT NOTE     Patient: Shelby Barron  Day: 35      SHIFT EVENTS     Shift Narrative/Significant Events (PRN med administration, fall, RRT, etc.):     Pt had Temperature of 99.8 F and ice pack applied. Pt c/o abd pain for which oxycodone given once. Safety and fall precautions remain in place. Purposeful rounding completed.          ASSESSMENT     Changes in assessment from patient's baseline this shift:    Neuro: No  CV: No  Pulm: No  Peripheral Vascular: No  HEENT: No  GI: No  BM during shift: No, Last BM: Last BM Date: 08/01/22  GU: No   Integ: No  MS: No    Pain: No change  Pain Interventions: Medications  Medications Utilized: Oxycodone oral    Mobility: PMP Activity: Step 3 - Bed Mobility of Distance Walked (ft) (Step 6,7): 0 Feet           Lines     Patient Lines/Drains/Airways Status       Active Lines, Drains and Airways       Name Placement date Placement time Site Days    Permacath Catheter - Tunneled 07/14/22 Subclavian vein catheter Right 07/14/22  1404  -- 18    Midline IV 07/01/22 Anterior;Left Upper Arm 07/01/22  1315  Upper Arm  31                         VITAL SIGNS     Vitals:    08/02/22 0737   BP: 164/61   Pulse: 73   Resp: 22   Temp: 98.8 F (37.1 C)   SpO2: 96%       Temp  Min: 97.9 F (36.6 C)  Max: 99.9 F (37.7 C)  Pulse  Min: 58  Max: 80  Resp  Min: 16  Max: 22  BP  Min: 111/41  Max: 164/61  SpO2  Min: 94 %  Max: 99 %      Intake/Output Summary (Last 24 hours) at 08/02/2022 0818  Last data filed at 08/01/2022 1915  Gross per 24 hour   Intake 120 ml   Output --   Net 120 ml          CARE PLAN       Problem: Moderate/High Fall Risk Score >5  Goal: Patient will remain free of falls  Flowsheets (Taken 08/02/2022 0817)  High (Greater than 13):   HIGH-Visual cue at entrance to patient's room   HIGH-Bed alarm on at all times while patient in bed   HIGH-Apply yellow "Fall Risk" arm band   HIGH-Initiate use of floor mats as appropriate   HIGH-Consider use of low bed     Problem:  Compromised Moisture  Goal: Moisture level Interventions  Flowsheets (Taken 08/02/2022 0817)  Moisture level Interventions: Moisture wicking products, Moisture barrier cream     Problem: Compromised Friction/Shear  Goal: Friction and Shear Interventions  Flowsheets (Taken 08/02/2022 0817)  Friction and Shear Interventions: Pad bony prominences, Off load heels, HOB 30 degrees or less unless contraindicated, Consider: TAP seated positioning, Heel foams     Problem: Pain  Goal: Pain at adequate level as identified by patient  Flowsheets (Taken 08/02/2022 0817)  Pain at adequate level as identified by patient:   Identify patient comfort function goal   Assess for risk of opioid induced respiratory depression, including snoring/sleep apnea. Alert  healthcare team of risk factors identified.   Assess pain on admission, during daily assessment and/or before any "as needed" intervention(s)   Reassess pain within 30-60 minutes of any procedure/intervention, per Pain Assessment, Intervention, Reassessment (AIR) Cycle   Evaluate if patient comfort function goal is met   Evaluate patient's satisfaction with pain management progress   Offer non-pharmacological pain management interventions     Problem: Diabetes: Glucose Imbalance  Goal: Blood glucose stable at established goal  Flowsheets (Taken 08/02/2022 0817)  Blood glucose stable at established goal:   Monitor lab values   Monitor intake and output.  Notify LIP if urine output is < 30 mL/hour.   Follow fluid restrictions/IV/PO parameters   Include patient/family in decisions related to nutrition/dietary selections   Assess for hypoglycemia /hyperglycemia   Monitor/assess vital signs   Coordinate medication administration with meals, as indicated     Problem: Pain interferes with ability to perform ADL  Goal: Pain at adequate level as identified by patient  Flowsheets (Taken 08/02/2022 0817)  Pain at adequate level as identified by patient:   Identify patient comfort function  goal   Assess for risk of opioid induced respiratory depression, including snoring/sleep apnea. Alert healthcare team of risk factors identified.   Assess pain on admission, during daily assessment and/or before any "as needed" intervention(s)   Reassess pain within 30-60 minutes of any procedure/intervention, per Pain Assessment, Intervention, Reassessment (AIR) Cycle   Evaluate if patient comfort function goal is met   Evaluate patient's satisfaction with pain management progress   Offer non-pharmacological pain management interventions

## 2022-08-02 NOTE — Plan of Care (Signed)
NURSING SHIFT NOTE     Patient: Shelby Barron  Day: 35      SHIFT EVENTS     Shift Narrative/Significant Events (PRN med administration, fall, RRT, etc.):     - A&O x1-3, wax and wean, on RA  - Denies chest pain, dizziness, Sob, n/v  - VSS. Scheduled meds given whole in apple sauce. Accu check, no SSI given. Prn dilaudid x1 for wound debridement  - Wound vac change and wound debridement done.  - Stroke alert call, prn narcan x1 given. ABG and CXR done.  - Bed mobility, anuric. Plan of care explained pt's daughter at bedside, verbalized  understanding.      Safety and fall precautions remain in place. Purposeful rounding completed.          ASSESSMENT     Changes in assessment from patient's baseline this shift:    Neuro: No  CV: No  Pulm: No  Peripheral Vascular: No  HEENT: No  GI: No  BM during shift: No, Last BM: Last BM Date: 08/01/22  GU: No   Integ: No  MS: No    Pain: No change  Pain Interventions: Rest  Medications Utilized: n/a    Mobility: PMP Activity: Step 3 - Bed Mobility of Distance Walked (ft) (Step 6,7): 0 Feet           Lines     Patient Lines/Drains/Airways Status       Active Lines, Drains and Airways       Name Placement date Placement time Site Days    Permacath Catheter - Tunneled 07/14/22 Subclavian vein catheter Right 07/14/22  1404  -- 18    Midline IV 07/01/22 Anterior;Left Upper Arm 07/01/22  1315  Upper Arm  31                         VITAL SIGNS     Vitals:    08/02/22 0737   BP: 164/61   Pulse: 73   Resp: 22   Temp: 98.8 F (37.1 C)   SpO2: 96%       Temp  Min: 97.9 F (36.6 C)  Max: 99.9 F (37.7 C)  Pulse  Min: 58  Max: 80  Resp  Min: 16  Max: 22  BP  Min: 111/41  Max: 164/61  SpO2  Min: 94 %  Max: 99 %      Intake/Output Summary (Last 24 hours) at 08/02/2022 1107  Last data filed at 08/02/2022 0838  Gross per 24 hour   Intake 220 ml   Output --   Net 220 ml            CARE PLAN        Problem: Moderate/High Fall Risk Score >5  Goal: Patient will remain free of falls  Outcome:  Progressing  Flowsheets (Taken 08/02/2022 0817 by Kipp Laurence, RN)  High (Greater than 13):   HIGH-Visual cue at entrance to patient's room   HIGH-Bed alarm on at all times while patient in bed   HIGH-Apply yellow "Fall Risk" arm band   HIGH-Initiate use of floor mats as appropriate   HIGH-Consider use of low bed     Problem: Impaired Mobility  Goal: Mobility/Activity is maintained at optimal level for patient  Outcome: Progressing  Flowsheets (Taken 07/29/2022 1755 by Lorriane Shire, RN)  Mobility/activity is maintained at optimal level for patient:   Consult/collaborate with Physical Therapy and/or Occupational Therapy   Encourage  independent activity per ability     Problem: Safety  Goal: Patient will be free from injury during hospitalization  Outcome: Progressing  Flowsheets (Taken 07/31/2022 0618 by Connye Burkitt, RN)  Patient will be free from injury during hospitalization: Include patient/ family/ care giver in decisions related to safety     Problem: Pain  Goal: Pain at adequate level as identified by patient  Outcome: Progressing  Flowsheets (Taken 08/02/2022 0817 by Kipp Laurence, RN)  Pain at adequate level as identified by patient:   Identify patient comfort function goal   Assess for risk of opioid induced respiratory depression, including snoring/sleep apnea. Alert healthcare team of risk factors identified.   Assess pain on admission, during daily assessment and/or before any "as needed" intervention(s)   Reassess pain within 30-60 minutes of any procedure/intervention, per Pain Assessment, Intervention, Reassessment (AIR) Cycle   Evaluate if patient comfort function goal is met   Evaluate patient's satisfaction with pain management progress   Offer non-pharmacological pain management interventions     Problem: Side Effects from Pain Analgesia  Goal: Patient will experience minimal side effects of analgesic therapy  Outcome: Progressing  Flowsheets (Taken 07/31/2022 0618 by Connye Burkitt,  RN)  Patient will experience minimal side effects of analgesic therapy: Assess for changes in cognitive function     Problem: Hemodynamic Status: Cardiac  Goal: Stable vital signs and fluid balance  Outcome: Progressing  Flowsheets (Taken 07/29/2022 1755 by Lorriane Shire, RN)  Stable vital signs and fluid balance:   Assess signs and symptoms associated with cardiac rhythm changes   Monitor lab values     Problem: Renal Instability  Goal: Fluid and electrolyte balance are achieved/maintained  Outcome: Progressing  Flowsheets (Taken 07/29/2022 1755 by Lorriane Shire, RN)  Fluid and electrolyte balance are achieved/maintained:   Monitor/assess lab values and report abnormal values   Monitor for muscle weakness   Assess and reassess fluid and electrolyte status   Follow fluid restrictions/IV/PO parameters   Observe for cardiac arrhythmias     Problem: Diabetes: Glucose Imbalance  Goal: Blood glucose stable at established goal  Outcome: Progressing  Flowsheets (Taken 08/02/2022 0817 by Kipp Laurence, RN)  Blood glucose stable at established goal:   Monitor lab values   Monitor intake and output.  Notify LIP if urine output is < 30 mL/hour.   Follow fluid restrictions/IV/PO parameters   Include patient/family in decisions related to nutrition/dietary selections   Assess for hypoglycemia /hyperglycemia   Monitor/assess vital signs   Coordinate medication administration with meals, as indicated     Problem: Neurological Deficit  Goal: Neurological status is stable or improving  Outcome: Progressing  Flowsheets (Taken 07/29/2022 1755 by Lorriane Shire, RN)  Neurological status is stable or improving:   Monitor/assess/document neurological assessment (Stroke: every 4 hours)   Monitor/assess NIH Stroke Scale   Observe for seizure activity and initiate seizure precautions if indicated   Perform CAM Assessment   Re-assess NIH Stroke Scale for any change in status     Problem: Every Day - Stroke  Goal: Mobility/Activity is maintained  at optimal level for patient  Outcome: Progressing  Flowsheets (Taken 07/29/2022 1755 by Lorriane Shire, RN)  Mobility/activity is maintained at optimal level for patient:   Consult/collaborate with Physical Therapy and/or Occupational Therapy   Encourage independent activity per ability  Goal: Neurological status is stable or improving  Outcome: Progressing  Flowsheets (Taken 07/29/2022 1755 by Lorriane Shire, RN)  Neurological status is stable or improving:   Monitor/assess/document  neurological assessment (Stroke: every 4 hours)   Monitor/assess NIH Stroke Scale   Observe for seizure activity and initiate seizure precautions if indicated   Perform CAM Assessment   Re-assess NIH Stroke Scale for any change in status  Goal: Core/Quality measure requirements - Daily  Outcome: Progressing  Flowsheets (Taken 07/29/2022 1755 by Lorriane Shire, RN)  Core/Quality measure requirements - Daily:   VTE Prevention: Ensure anticoagulant(s) administered and/or anti-embolism stockings/devices documented by end of day 2   Ensure antithrombotic administered or contraindication documented by LIP by end of day 2   Once lipid panel has resulted, check LDL. Contact provider for statin order if LDL > 70 (or ensure contraindication documented by LIP).   Continue stroke education (must include Modifiable Risk Factors, Warning Signs and Symptoms of Stroke, Activation of Emergency Medical System and Follow-up Appointments). Ensure handout has been given and documented.  Goal: Stable vital signs and fluid balance  Outcome: Progressing  Flowsheets (Taken 07/29/2022 1755 by Lorriane Shire, RN)  Stable vital signs and fluid balance:   Position patient for maximum circulation/cardiac output   Monitor and assess vitals every 4 hours or as ordered and hemodynamic parameters   Monitor intake and output. Notify LIP if urine output is < 30 mL/hour.   Encourage oral fluid intake   Apply telemetry monitor as ordered  Goal: Patient will maintain adequate  oxygenation  Outcome: Progressing  Flowsheets (Taken 07/29/2022 1755 by Lorriane Shire, RN)  Patient will maintain adequate oxygenation:   Sleep Apnea: Assess if previously tested or on CPAP   Suction secretions as needed   Maintain SpO2 of greater than 92%   Sleep Apnea: Encourage patient to speak to PCP regarding sleep apnea/sleep study ([place] only)  Goal: Patient's risk of aspiration will be minimized  Outcome: Progressing  Flowsheets (Taken 07/29/2022 1755 by Lorriane Shire, RN)  Patient's risk of aspiration will be minimized:   Complete new dysphagia screen for any change in status: Keep patient NPO if patient fails   Place swallow precaution signage above bed   Monitor/assess for signs of aspiration (tachypnea, cough, wheezing, clearing throat, hoarseness after eating, decrease in SaO2   Assess and monitor ability to swallow   Thicken liquids as recommended by Speech Pathologist   Order modified texture diet as recommend by Speech Pathologist   Keep head of bed up a minimum of 30 degrees when hemodynamically stable   Place patient up in chair to eat, if possible   Instruct patient to take small bites   Do not use a straw   Supervise patient during oral intake   Consult/Collaborate with Speech Pathologist for dysphagia   HOB up 90 degrees to eat if unable to be OOB  Goal: Nutritional intake is adequate  Outcome: Progressing  Flowsheets (Taken 07/29/2022 1755 by Lorriane Shire, RN)  Nutritional intake is adequate:   Consult/collaborate with Clinical Nutritionist   Consult/collaborate with Speech Therapy (swallow evaluations)  Goal: Skin integrity is maintained or improved  Outcome: Progressing  Flowsheets (Taken 07/29/2022 1755 by Lorriane Shire, RN)  Skin integrity is maintained or improved:   Assess Braden Scale every shift   Turn or reposition patient every 2 hours or as needed unless able to reposition self   Increase activity as tolerated/progressive mobility   Avoid shearing   Keep skin clean and dry   Monitor  patient's hygiene practices   Utilize specialty bed   Keep head of bed 30 degrees or less (unless contraindicated)   Collaborate with Wound,  Ostomy, and Continence Nurse   Encourage use of lotion/moisturizer on skin   Relieve pressure to bony prominences  Goal: Neurovascular status is stable or improving  Outcome: Progressing  Flowsheets (Taken 07/29/2022 1755 by Lorriane Shire, RN)  Neurovascular status is stable or improving:   Monitor/assess neurovascular status (pulses, capillary refill, pain, paresthesia, presence of edema)   Monitor/assess site of invasive procedure for signs of bleeding   Monitor/assess for signs of Venous Thrombus Emboli (edema of calf/thigh redness, pain)  Goal: Effective coping demonstrated  Outcome: Progressing  Flowsheets (Taken 07/29/2022 1755 by Lorriane Shire, RN)  Effective coping demonstrated:   Offer reassurance to decrease anxiety   Assess/report to LIP uncontrolled anxiety, depression, or ineffective coping  Goal: Will be able to express needs and understand communication  Outcome: Progressing  Flowsheets (Taken 07/29/2022 1755 by Lorriane Shire, RN)  Able to express needs and understand communication:   Provide alternative method of communication if needed   Consult/collaborate with Speech Language Pathology (SLP)   Consult/collaborate with Case Management/Social Work   Include patient care companion in decisions related to communication   Patient/patient care companion demonstrates understanding on disease process, treatment plan, medications and discharge plan     Problem: Pain interferes with ability to perform ADL  Goal: Pain at adequate level as identified by patient  Outcome: Progressing  Flowsheets (Taken 08/02/2022 0817 by Kipp Laurence, RN)  Pain at adequate level as identified by patient:   Identify patient comfort function goal   Assess for risk of opioid induced respiratory depression, including snoring/sleep apnea. Alert healthcare team of risk factors identified.    Assess pain on admission, during daily assessment and/or before any "as needed" intervention(s)   Reassess pain within 30-60 minutes of any procedure/intervention, per Pain Assessment, Intervention, Reassessment (AIR) Cycle   Evaluate if patient comfort function goal is met   Evaluate patient's satisfaction with pain management progress   Offer non-pharmacological pain management interventions

## 2022-08-02 NOTE — Progress Notes (Addendum)
Wound Ostomy Continence Progress Note    Date Time: 08/02/22 11:22 AM  Patient Name: Shelby Barron, Shelby Barron  Consulting Service: [facility] Day: 36    Vac Dressing Change/Bedside Debridement    Assessment     Pertinent skin and/or ostomy history includes   Acid reflux, Asthma, well controlled, Cancer of kidney, Chronic lower back pain, Congestive heart disease, COPD (chronic obstructive pulmonary disease), CVA (cerebral infarction) (01/12/2003), Diabetes, Diabetic nephropathy, Fibroids, Gout, H/O: gout, Hyperlipidemia, Hypertension, Neuropathy of hand, and SOB (shortness of breath)..  Admitted with Bladder cancer.      Assessment:   At bedside for vac change.  Daughter at bedside, Dr. Deno Etienne (Surgery) at bedside.  Patient is s/p debridement of surgical incision midline   Wound Type and Location:  Surgical Wound Mid Abdomen/RLQ Wound   Dimensions: 30 cm x 7 cm x 6 cm RLQ wound   Wound Bed:   Full thickness tissue loss with beefy red granulation tissue along wound edges. Granulation tissue 70% Nongranulation tissue 25% Slough 0%   Undermining: no  Tunneling:  no  Wound Edges: Right lower Quadrant wound edges between 6 and 9 O'clock (peri wound debrided) 7 cm x 6 cm x 0.2  cm (full thickness skin exposed subcutaneous tissue visible, dark pink moist wound bed)  PeriWound: hyper pigmented wound between 6 and 9 O'clock, hyperpigmented (with pale pink wound edges around RLQ)  Drainage:  sanguinous  Pain:  yes given pain medication  Odor: no     Dr. Deno Etienne at bedside.  Spoke at length with daughter regarding the procedure and expplained step by step what he was going to do.  Consent obtained and procedure completed using aseptic technique.   Patient tolerated procedure well with premedication.    Wound care to mid abdomen/ Vac Dressing Change     Area cleansed with Vashe wash (soaked for 5-10 minutes ) and  patted dry.    Applied  No-Sting skin barrier film to peri-wound and allowed to dry.   Placed Vac drape around  peri-wound window paned style, to prevent maceration and applied Eakin's ring around umbilicus, top of wound, peri wound RLQ wound and along the pannus and  curvature of pannus for more adherence.     Packed 1 pc (cut into two pieces) of silver non adherent dressing and top with  two pieces of white foam and 3 pieces of of black foam into the wound (two pieces mid-abdomen bridged to RLQ peri wound 1 pc)  Secured with vac drape.   A quarter size cut hole was cut into the black foam to fit the trackpad.   Good seal obtained and wound vacuum setting at 125 mm Hg, low intensity continuous.  Aseptic technique utilized patient called out for  "Aunt Mattie "able to calm and comfort patient.  PRN pain medication given.         Vashe Wound Solution helps to cleanse the wound and accomplish the goals of wound bed preparation in a biocompatible, safe, effective, and natural way. This solution of hypochlorous acid (HOCl) acts as a preservative that inhibits microbial contamination within the solution. Vashe Wound Solution has the highest concentration of pure HOCl.     Vashe comes from supply rooms or central supply.     Plan   Plan:   Continue pressure prevention bundle:     Head of the bed 30 degrees or less  Positioning device to the bedside  Eliminate/minimize pressure from the area  Float heels  with boots or pillows  Turn patient  Pressure redistribution cushion to the chair  Use lift sheets/low friction surface sheets for positioning.  Pad bony prominences  Nutrition consult/Optimize nutrition  Initiate bed algorithm/Specialty bed  Moisture/Incontinence management - Cleanse with incontinence cleansing wipe/water to manage incontinence and protect skin from exposure to urine and stool. Apply skin barrier protection cream. Apply [place]/external or female external urine management system to prevent urinary contamination of a wound. Apply rectal pouch/fecal management system per unit policy, to prevent fecal contamination of a  wound or contain diarrhea.            1.  Wound care orders entered into EMR.   2.  Care rendered.  3.  RN checks wound VAC Q 2 hr: Sponge contracted;  pump "ON" and plugged in.    4.  If unable to maintain the seal, apply Tegaderm dressing or vac drape to seal. May also contact [facility] at 1 - [phone] to troubleshoot.  5.  If still unable to maintain the seal and vac off or not working for 2 hours, remove all foam and place hydrogel moistened gauze into wound bed, cover with ABD pad secured with tape, and notify Physician and WOC team.          6.  Change two-to three times a week. Next vac change due on Thursday    When patient discharged or VAC discontinued -   Remove all foam dressing and non adherent dressing and place hydrogel moistened gauze into the wound bed, cover with ABD pad tape to secure.   Wound Vac machine - discard canister and tubing.    Place wound vac in CLEAR plastic bag and place in the soiled utility room. DO NOT USE RED BIOHAZARD BAGS.    Please contact WOC office [phone] and leave a message for WOC to pick up.        Discharge VAC Wound Information     Prescribed [facility] VAC Therapy for duration:  4 months      Discharge VAC Wound Information    Surgical Wound  mid abdomen/RLQ  Age of wound: 06/28/22  Did VAC initiate at the hospital?     yes         Initiated - 06/28/22  Compromised Nutritional status? no Nutrition consulted for wound care needs.  Other therapies previously tried: no.  Is there eschar present in the wound: no  Has wound been debrided in the last ten days:   yes 08/01/22  Are serial debridements required: no  Location: mid abdomen and RLQ     Measurements: Date- 08/01/22 Surgical Wound Midline                            Length- 30 cm                             Width- 7 cm                            Depth-  6 cm                            Undermining- no  cm  Tunneling-  no cm    Right lower Quadrant wound edges between 6 and 9 O'clock (peri wound  debrided) 7 cm x 6 cm x 0.2  cm (full thickness skin exposed subq tissue visible)wound bed red, pink and tan sub cutaneous tissue    The appearance of wound bed and color: red, pink, brown  Exudate: sanguinous  Is wound full-thickness: yes  Is bone, tendon or muscle exposed: yes  Is there undermining no  Is there tunneling no    7.   Type of Foam used: black simplace and white foam  Number of pieces used: 3 black and 2 white (3 pieces of of black foam into the wound -two pieces mid-abdomen bridged to RLQ peri wound 1 pc)   Contact layer applied: silver non adherent  Continuous negative pressure setting: 125 mm Hg, low, intensity continuous    Has the WOCN team attempted other dressings: no  WOCN assessed clinical reasons for the continuation of VAC at home:  Promotes wound healing, Speed granulation tissue, prevent infection               Wound Photography:     Objective Findings   Populated from Epic Documentation:  Specialty Bed:   Braden: Braden Scale Score: 11 (08/02/22 0920)  Braden Subscales:  Sensory Perceptions: Very limited (08/02/22 0920)  Moisture: Occasionally moist (08/02/22 0920)  Activity: Bedfast (08/02/22 0920)  Mobility: Very limited (08/02/22 0920)  Nutrition: Probably inadequate (08/02/22 0920)  Friction and Shear: Problem (08/02/22 0920)    Ht Readings from Last 1 Encounters:   07/18/22 1.575 m (5\' 2" )     Wt Readings from Last 3 Encounters:   08/02/22 89.3 kg (196 lb 13.9 oz)   06/25/22 87.1 kg (192 lb)   06/23/22 84.8 kg (187 lb)     Body mass index is 36.01 kg/m.    Current Diet:   Supervise For Meals Frequency: All meals  Adult diet Therapeutic/ Modified; Solid; Pureed (IDDSI level 4); Mildly thick (IDDSI level 2); Room service: No  Nepro Shake Quantity: A. One; Frequency: BID (2 times a day) - with Lunch and Dinner     History of Present Illness   This is a 82 y.o. female  has a past medical history of Acid reflux, Asthma, well controlled, Cancer of kidney, Chronic lower back pain,  Congestive heart disease, COPD (chronic obstructive pulmonary disease), CVA (cerebral infarction) (01/12/2003), Diabetes, Diabetic nephropathy, Fibroids, Gout, H/O: gout, Hyperlipidemia, Hypertension, Neuropathy of hand, and SOB (shortness of breath)..  Admitted with Bladder cancer.      Past Surgical History:   Procedure Laterality Date    APPENDECTOMY (OPEN)      CLOSURE, ENTEROTOMY, SMALL INTESTINE  06/28/2022    Procedure: CLOSURE, ENTEROTOMY, SMALL INTESTINE;  Surgeon: Marlaine Hind, MD;  Location: [facility];  Service: General;;    CYSTECTOMY, ILEOCONDUIT N/A 06/28/2022    Procedure: CYSTECTOMY, RADICAL;  Surgeon: Neldon Newport, MD;  Location: [facility];  Service: Urology;  Laterality: N/A;    DRAIN (OTHER) N/A 05/31/2022    Procedure: DRAIN (OTHER);  Surgeon: Hope Pigeon, MD;  Location: [facility];  Service: Interventional Radiology;  Laterality: N/A;    EXPLORATORY LAPAROTOMY N/A 06/30/2022    Procedure: SMALL BOWEL ANASTOMOSIS, CLOSURE OF MESENTERIC DEFECT, CLOSURE OF ABDOMINAL WALL;  Surgeon: Marlaine Hind, MD;  Location: [facility];  Service: General;  Laterality: N/A;  **[id number]/ MD AVAIL [phone] OR AFTER 1600**    EXPLORATORY LAPAROTOMY, RESECTION SMALL BOWEL  N/A 06/28/2022    Procedure: RESECTION SMALL BOWEL;  Surgeon: Marlaine Hind, MD;  Location: [facility];  Service: General;  Laterality: N/A;    HERNIA REPAIR      HYSTERECTOMY      LAPAROTOMY, NEPHRECTOMY Bilateral 06/28/2022    Procedure: BILATERAL OPEN RADICAL NEPHRECTOMY AND LYSIS OF ASHESION;  Surgeon: Neldon Newport, MD;  Location: [facility];  Service: Urology;  Laterality: Bilateral;    PARTIAL NEPHRECTOMY      tumor removed from kidney  cJanuary 2015    TUNNELED CATH PLACEMENT (PERMCATH) N/A 06/01/2022    Procedure: TUNNELED CATH PLACEMENT;  Surgeon: Suszanne Finch, MD;  Location: [facility];  Service: Interventional Radiology;  Laterality: N/A;    TUNNELED CATH PLACEMENT (PERMCATH) Right 07/14/2022    Procedure:  TUNNELED CATH PLACEMENT;  Surgeon: Hope Pigeon, MD;  [facility];  Service: Interventional Radiology;  Laterality: Right;  right IJ tunneled HD catheter placement    TUNNELED CATH REMOVAL (PERMCATH) Right 07/14/2022    Procedure: TUNNELED CATH REMOVAL;  Surgeon: Hope Pigeon, MD;  Location: [facility];  Service: Interventional Radiology;  Laterality: Right;  right neck/chest tunneled HD catheter removal    TURBT      WOUND VAC APPLICATION N/A 06/28/2022    Procedure: Tawni Pummel WOUND VAC APPLICATION;  Surgeon: Marlaine Hind, MD;  Location: [facility];  Service: General;  Laterality: N/A;    WOUND VAC APPLICATION N/A 06/30/2022    Procedure: WOUND VAC APPLICATION;  Surgeon: Marlaine Hind, MD;  Location: [facility];  Service: General;  Laterality: N/A;       Procedure(s) with comments:  TUNNELED CATH REMOVAL - right neck/chest tunneled HD catheter removal    35 Days Post-Op  -------------------  **Canceled**    Procedure(s) with comments:  TUNNELED CATH REMOVAL - right neck/chest tunneled HD catheter removal    * No surgery date entered *  -------------------    Procedure(s) with comments:  TUNNELED CATH REMOVAL - right neck/chest tunneled HD catheter removal    33 Days Post-Op  -------------------    Procedure(s) with comments:  TUNNELED CATH REMOVAL - right neck/chest tunneled HD catheter removal    19 Days Post-Op  -------------------    Procedure(s) with comments:  TUNNELED CATH REMOVAL - right neck/chest tunneled HD catheter removal    19 Days Post-Op  -------------------      Lab   Significant Lab Values:  Recent Labs     08/02/22  0527   WBC 13.08*   RBC 2.99*   Hemoglobin 8.8*   Hematocrit 27.1*   Sodium 139   Potassium 4.5   Chloride 101   CO2 23   BUN 34*   Creatinine 4.6*   Calcium 8.5   Albumin 1.6*   GFR 8.8*   Glucose 99           Burnett Spray "Pepper" Zeffie Bickert  MSN, RN, WOCN  [facility]  Wound, Ostomy, and  Continence Coordinator  [phone] Office  [phone]  [phone] [employer]  [email or web address]

## 2022-08-02 NOTE — Progress Notes (Signed)
[facility]  PROGRESS NOTE      Patient: Shelby Barron  Date: 08/02/2022   LOS: 35 Days  Admission Date: 06/28/2022   MRN: [id number]  Attending: Melina Fiddler, MD  When on service as the attending, please contact me on Epic Secure Chat from 7 AM - 7 PM for non-urgent issues. For urgent matters use XTend page from 7 AM - 7 PM.       ASSESSMENT/PLAN     Shelby Barron is a 82 y.o. female admitted with Bladder cancer      [name], [name], type 2 diabetes, renal cell carcinoma status post partial bilateral nephrectomy, [name], new diagnosis of urothelial cancer initally presented on 6/10 for elective cystectomy and completion nephrectomy, [name], [name], extubated and off pressors since 6/13.   She has been along hospital course, complicated by acute stroke and new onset A-fib.  Patient started on anticoagulation on 7 5    H/H drop but no overt bleeding on 7/7. Transfused 1U pRBCs. Also started on Aranesp. CTH showed few areas of cortical hyperdensity - petechial hemorrhage vs cortical laminar necrosis. OK to continue with eliquis per neuro given stable neuro exam.  Repeated CT head stable  She had NG tube and she passed SLP and NG tube was removed  Her wound has necrotic tissue and she had bedside debridement by general surgery today 08/02/2022  She got Dilaudid in the morning for debridement and in the afternoon patient was found to be less responsive stroke code was called but was canceled patient was given Narcan and with frequent painful stimuli patient will wake up and was more responsive and back to her baseline     Anticipated Discharge: Hopefully within 48 hours     Barriers to Discharge: Clinical improvement     Family/Social/Case Mgr Assistance: Patient daughter needs to choose SNF place  Toxic metabolic Encephalopathy/delirium resolving  -On and off confusion  alert oriented x 2 today to self and place  - Patient was upgraded to ICU on 6/30 for lethargy and decreased responsiveness  - CT head was repeated on 6/29, no acute finding  - ABG consistent with respiratory alkalosis  - EEG consistent with encephalopathy no seizure, MRI no acute stroke  - CT abdomen with and without contrast after premedication did not show any leak or abscess  - SLP advanced diet to pureed.   -NG tube removed 7/9  -SLP following daily  - Palliative consulted:  Family is clear they would like no limits to care and patient should remain full code.     Anemia, 2/2 CKD and blood loss during OR  H&H 6.8/21  Will transfuse 1 units of PRBC during hemodialysis today  - TSAT 18%  - continue Aranesp per nephrology  - Trend CBC, transfuse Hgb<7. .  No active signs of bleeding    s/p open cystectomy, bilateral nephrectomies, and resection of small bowel after intra-op enterotomy    Mixed shock - hemorrhagic vs septic: Resolved  Leukocytosis  ?  Necrotic wound tissue  -I reviewed the images from last wound care note on July 8 which showed some necrotic tissue  Status post wound debridement by general surgery at bedside  Heparin Hart resume back  I discussed with ID and plan for short course of antibiotic with debridement.  Short course  of meropenem  - 6/10 small bowel enteretomy with 1L EBL. Went to the OR on June 12 for small bowel anastomosis, closure of mesenteric defect, closure of abdominal wall with wound VAC application. Wound VAC last changed on 7/15  - Surgery consulted, recommend CTAP with PO contrast that showed no anastomotic leak. Low concern for abscesses as they'd expect these to have a more loculated appearance in the interim between 6/17 and 7/1 imaging  - ID consulted, completed meropenem and Mycamine for 2 weeks on 7/3.         Acute CVA on the right frontal lobe most likely from hypotension and shock  History of CVA  Petechial hemorrhage vs cortical laminar necrosis on CTH  - Left upper  extremity not moving at all unclear duration  - Neurology following, initially started on ASA 81mg  only due to concern for risk of hemorrhagic conversion. Now OK to switch to Eliquis for Afib and discontinue aspirin per neurology on 7/5 as > 10d post event.  - SBP goal 120-160  - Carotid Doppler showed plaque with no critical stenosis  - Head CT on 7/7 w/ a few areas of cortical hyperdensity - petechial hemorrhage vs cortical laminar necrosis. Discussed with neuro OK to continue eliquis for now. Plan for repeat CTH today. Neuro exam stable.  - EEG encephalopathic pattern no seizure, repeat MRI no new infarction  - PT OT recommend SNF  -Repeat CT head on 07/26/2022 stable     Acute hypoxemic respiratory failure resolved  COPD  Extubated on 6/13, currently on room air  Maintain SaO2 > 92%  Continue as needed neb treatment  X-ray repeated on 6/29 showed resolution of interstitial edema     ESRD  S/p CRRT this admission, now on HD TTS  Continue dialysis per nephrology  IR removed the nonfunctional permacath and right [place] IJ cath and replaced with a new permacath on 6/26.     HTN  On metoprolol   PRN IV labetalol for SBP > 180      New onset Afib with RVR, improved  - CHA2DS2-VASc score is 7  - Status post amiodarone drip. Now stable on oral amiodarone and coreg per cardiology recommendations  - GI and general surgery cleared for anticoagulation. Neurology recommended to hold anticoagulation initiation for 10 days given the size of stroke.   -  started on Eliquis since 07/23/22  Hold Eliquis starting today 7/10 for 2 days prior to wound debridement     Renal cell carcinoma outpatient follow-up hematology oncology  Urothelial carcinoma  Thrombocytopenia  - VTE ppx: SCDs, eliquis     Diabetes Mellitus type 2  -Sliding scale insulin  Blood glucose was 90 early morning acting insulin to be hold for now                     Nutrition: Pured diet as tolerated  Malnutrition Documentation    Moderate Malnutrition related to  inadequate nutritional intake in the setting of acute illness as evidenced by <50% EER > 5 days, mild muscle losses (temporalis, deltoid) - new                 Patient has BMI=Body mass index is 36.01 kg/m.  Diagnosis: Obesity based on BMI criteria                     Recent Labs   Lab 08/02/22  0527 08/01/22  0320 07/31/22  0754   Hemoglobin 8.8*  9.4* 7.2*   Hematocrit 27.1* 28.0* 22.5*   MCV 90.6 89.5 91.5   WBC 13.08* 11.72* 11.91*   Platelet Count 270 300 345     Recent Labs   Lab 07/18/22  0421 06/02/22  0514   Ferritin 2,300.21*  --    Iron 18* 62   TIBC 99* 145*   Iron Saturation 18 43     Anemia Diagnosis: Already documented in note    Code Status: Full Code    Dispo: SNF    Family Contact: Daughter [name] at bedside    DVT Prophylaxis:   Current Facility-Administered Medications (Includes Only Anticoagulants, Misc. Hematological)   Medication Dose Route Last Admin    apixaban (ELIQUIS) tablet 2.5 mg  2.5 mg Oral            CHART  REVIEW & DISCUSSION     The following chart items were reviewed as of 4:17 PM on 08/02/22:  [x]  Lab Results [x]  Imaging Results   [x]  Problem List  [x]  Current Orders [x]  Current Medications  []  Allergies  [x]  Code Status [x]  Previous Notes   []  SDoH    The management and plan of care for this patient was discussed with the following specialty consultants:  []  Cardiology  []  Gastroenterology                 [x]  Infectious Disease  []  Pulmonology []  Neurology                [x]  Nephrology  []  Neurosurgery []  Orthopedic Surgery  []  Heme/Onc  [x]  General Surgery []  Psychiatry                                   []  Palliative    SUBJECTIVE     Shelby Barron was more lethargic and drowsy with she responded to painful stimuli she was awake she was alert oriented x 2 to self and place she recognize her health aide Hazel no complaints  MEDICATIONS     Current Facility-Administered Medications   Medication Dose Route Frequency    acetaminophen  650 mg Oral 4 times per day    Or     acetaminophen  650 mg Oral 4 times per day    amiodarone  200 mg per NG tube Daily    apixaban  2.5 mg Oral Q12H SCH    atorvastatin  40 mg Oral QHS    carvedilol  12.5 mg Oral Q12H SCH    darbepoetin alfa  0.45 mcg/kg Subcutaneous Weekly    glycopyrrolate  0.2 mg Intravenous Q8H    insulin glargine  5 Units Subcutaneous QAM    insulin lispro  1-3 Units Subcutaneous QHS    insulin lispro  1-5 Units Subcutaneous TID AC    lidocaine  20 mL Intradermal Once    pantoprazole  40 mg per NG tube Q12H [facility]    thiamine  100 mg per NG tube Daily    umeclidinium-vilanterol  1 puff Inhalation QAM    vitamins/minerals  1 tablet Oral Daily       PHYSICAL EXAM     Vitals:    08/02/22 1441   BP: 149/73   Pulse: 67   Resp:    Temp:    SpO2: 100%       Temperature: Temp  Min: 97.9 F (36.6 C)  Max: 99.9 F (37.7 C)  Pulse: Pulse  Min: 58  Max: 80  Respiratory: Resp  Min: 16  Max: 22  Non-Invasive BP: BP  Min: 111/41  Max: 164/61  Pulse Oximetry SpO2  Min: 94 %  Max: 100 %    Intake and Output Summary (Last 24 hours) at Date Time    Intake/Output Summary (Last 24 hours) at 08/02/2022 1617  Last data filed at 08/02/2022 0838  Gross per 24 hour   Intake 220 ml   Output --   Net 220 ml         GEN APPEARANCE: Alert, awake and oriented x 1  HEENT: PERLA; EOMI; Conjunctiva Clear  NECK: Supple; No bruits  CVS: RRR, S1, S2; No M/G/R  LUNGS: CTAB; , no Rhonchi: No rales  ABD: Soft; No TTP; + Normoactive BS vertical abdominal wound and wound VAC in place  EXT: No edema; Pulses 2+ and intact  NEURO: moving right upper extremity spontaneously, able to move lower extremities for pain but minimal, no movement at all on the left upper extremity      LABS     Recent Labs   Lab 08/02/22  0527 08/01/22  0320 07/31/22  0754   WBC 13.08* 11.72* 11.91*   RBC 2.99* 3.13* 2.46*   Hemoglobin 8.8* 9.4* 7.2*   Hematocrit 27.1* 28.0* 22.5*   MCV 90.6 89.5 91.5   Platelet Count 270 300 345       Recent Labs   Lab 08/02/22  0527 08/01/22  0320 07/31/22  0449  07/30/22  0317 07/29/22  0350   Sodium 139 140 136 137 142   Potassium 4.5 4.3 4.7 4.2 4.4   Chloride 101 103 99 100 103   CO2 23 25 24 26 25    BUN 34* 23* 43* 31* 59*   Creatinine 4.6* 3.2* 4.5* 3.2* 5.3*   Glucose 99 94 82 85 105*   Calcium 8.5 8.4 8.6 8.5 9.5   Magnesium 1.9 2.0 1.9 1.8 2.1       Recent Labs   Lab 08/02/22  0527 08/01/22  0320   ALT 9 10   AST (SGOT) 12 16   Bilirubin, Total 0.2 0.3   Albumin 1.6* 1.7*   Alkaline Phosphatase 93 100             Recent Labs   Lab 08/02/22  0527 08/01/22  1230 08/01/22  0408   PTT 83* 52* 48*       Microbiology Results (last 15 days)       ** No results found for the last 360 hours. **             RADIOLOGY     XR Chest AP Portable    Result Date: 08/02/2022   Hypoventilation with basilar atelectasis. Mild central vascular congestion. Charlott Rakes, MD 08/02/2022 3:23 PM    CT Head WO Contrast    Result Date: 07/26/2022   1.No significant change in multifocal areas of subacute infarction within bilateral cerebral hemispheres. 2.Small areas of petechial hemorrhage in the left parietal and occipital lobes are unchanged. No new hemorrhage or measurable parenchymal hematoma. 3.Persistent complete opacification of the right mastoid air cells and middle ear cavity. Garnette Gunner, MD 07/26/2022 12:27 PM    CT Head WO Contrast    Result Date: 07/25/2022   1.Redemonstration of multifocal areas of bilateral acute infarctions. A few areas of cortical hyperdensity likely represent petechial hemorrhage. No mass occupying hematoma. No significant mass effect or midline shift. 2.Redemonstrated right mastoid and  right middle ear cavity effusion with areas of bony thinning versus dehiscence of the right tegmen tympani and right tegmen mastoideum. Vara Guardian, MD 07/25/2022 2:19 PM    CT Abdomen Pelvis W IV And PO Cont    Result Date: 07/19/2022   1.Slight interval increase in left-sided retroperitoneal fluid collection with the other abdominal fluid collections appearing relatively stable. 2.  There is no definite abscess or acute inflammatory process. 3. Incidental note is made of a small paraesophageal type hernia. 4. Sigmoid diverticulosis is noted without CT evidence of diverticulitis. 5. Other findings as noted above. Miguel Dibble, MD 07/19/2022 6:36 AM    MRI Brain WO Contrast    Result Date: 07/18/2022   Interval evolution of multifocal recent infarcts in the bilateral cerebral hemispheres. No new infarct. Milan Waunita Schooner, MD 07/18/2022 5:57 PM    XR Chest AP Portable    Result Date: 07/17/2022  1.Near-complete resolution of interstitial edema. 2.Improving pulmonary vascular congestion. Legrand Pitts, MD 07/17/2022 2:38 PM    CT Head WO Contrast    Result Date: 07/17/2022   1.No new acute intracranial pathology. 2.There are multiple regions of cytotoxic edema in the right > left frontal and left > right occipital lobes, consistent with known early subacute infarcts. Dannielle Burn 07/17/2022 8:26 AM    US Carotid Duplex Dopp Comp Bilateral    Result Date: 07/15/2022   1. Plaque formation in the right internal carotid artery bulb with elevated velocities. Stenosis is estimated at 50-69%. 2. Plaque in the left internal carotid artery with normal velocities. Any stenosis is less than 50%. Criteria for stenosis and velocity parameters are based on the guidelines of the SRU (Society of Radiologists in Ultrasound). Grant et al: Carotid Artery Stenosis: Grayscale and Doppler Ultrasound Diagnosis-- Society of Radiologists in Ultrasound Consensus Conference Ultrasound Quarterly. Volume 19, Number 4; December, 2003. Recommendations updated October 2021 Verne Carrow is an [facility] accredited facility Suszanne Finch, MD 07/15/2022 9:41 AM    Tunneled Cath Removal (Permcath)    Result Date: 07/14/2022  1. Nonfunctioning tunneled dialysis catheter as described above. 2. Successful placement of a new 19 cm Equistream catheter as described above. Dimitrios Papadouris, MD 07/14/2022 4:06 PM    Tunneled Cath Placement  (Permcath)    Result Date: 07/14/2022  1. Nonfunctioning tunneled dialysis catheter as described above. 2. Successful placement of a new 19 cm Equistream catheter as described above. Dimitrios Papadouris, MD 07/14/2022 4:06 PM    MRI Brain WO Contrast    Result Date: 07/13/2022   1.Patchy areas of acute/recent infarction involving the cerebral hemispheres bilaterally. The infarcts have a configuration suggesting watershed territory infarcts. 2.Chronic ischemic changes as discussed. Georgann Housekeeper, MD 07/13/2022 10:28 PM    CT Head WO Contrast    Result Date: 07/13/2022   1.New region of loss of gray-white matter differentiation in the right frontal lobe, concerning for acute infarction. 2.A few additional areas of poor gray-white matter differentiation for example in the left occipital lobe may be artifactual but could be correlated with MRI. 3.No evidence of acute intracranial hemorrhage or significant mass effect. These urgent results were discussed with and acknowledged by Dr. Don Broach on 07/13/2022 3:20 PM. Vara Guardian, MD 07/13/2022 3:21 PM    XR Chest AP Portable    Result Date: 07/10/2022  Interstitial markings have slightly increased, suggesting minimal edema. No other change. Wilmon Pali, MD 07/10/2022 4:49 PM    XR Chest AP Portable    Result Date: 07/07/2022   There  is mild interstitial prominence suggesting mild congestive heart failure/fluid overload in the setting of shortness of breath [name], MD 07/07/2022 12:32 PM    XR Chest AP Portable    Result Date: 07/06/2022   Hypoventilation with basilar atelectasis. Charlott Rakes, MD 07/06/2022 3:35 PM    CT Abdomen Pelvis W IV And PO Cont    Result Date: 07/06/2022  1.Postoperative changes from bilateral nephrectomy and cystectomy. 2.Small amount of ascites including some areas which appear loculated. Small collections of fluid and gas in the anterior midline abdomen may be postoperative but superimposed infection is not excluded. No rim-enhancing fluid collections or  extravasation of oral contrast to suggest anastomotic leak. Demetrios Isaacs, MD 07/06/2022 5:51 AM    CT Head WO Contrast    Result Date: 07/06/2022   1.No acute intracranial abnormality is seen. Leandro Reasoner, MD 07/06/2022 3:56 AM    XR Abdomen AP    Result Date: 07/05/2022  Enteric tube terminates in the distal stomach. Elease Etienne, DO 07/05/2022 12:24 PM   Echo Results       None          Results for orders placed or performed during the hospital encounter of 06/28/22   MRI Brain WO Contrast    Narrative    HISTORY: Declining mental status, right gaze deviation.     COMPARISON: CT head 07/17/2022 and MRI brain 07/13/2022.     TECHNIQUE: MRI of the brain performed on a 1.5 Tesla scanner without  intravenous contrast.    CONTRAST: None.    FINDINGS:     Interval evolution of multifocal recent infarcts with associated edema in  the bilateral cerebral hemispheres. No new infarct. Mild chronic small  vessel ischemic changes within the cerebral white matter. Mild generalized  cerebral volume loss.     No acute hemorrhage, extra-axial fluid collection, or midline shift. No  mass lesion or vasogenic edema.     Normal appearance of the sella. Cerebellar tonsils are appropriately  positioned. No acute hydrocephalus. Patent basal cisterns. Skull base flow  voids are intact.    No osseous abnormality. No acute orbital abnormality. Status post bilateral  lens replacement. No significant paranasal sinus mucosal disease. Severe  opacification of the right mastoid air cells and middle ear cavity. Mild  opacification of left mastoid air cells.       Impression         Interval evolution of multifocal recent infarcts in the bilateral cerebral  hemispheres. No new infarct.    Milan Waunita Schooner, MD  07/18/2022 5:57 PM   CT Head WO Contrast    Narrative    HISTORY: Petechial hemorrhage follow up.     COMPARISON: Multiple priors most recent CT 07/25/2022    TECHNIQUE: CT of the head performed without intravenous contrast.  The  following dose reduction techniques were utilized: automated exposure  control and/or adjustment of the mA and/or KV according to patient size,  and the use of an iterative reconstruction technique.    CONTRAST: None.    FINDINGS:  Multifocal areas of subacute infarction within bilateral cerebral  hemispheres remain similar to prior. Small areas of petechial hemorrhage in  the left parietal and occipital lobes are unchanged. No new hemorrhage or  measurable parenchymal hematoma.    There is a background moderate chronic microangiopathic changes in the  periventricular white matter. Moderate generalized volume loss. No  extra-axial fluid collection or midline shift. Ventricles and basal  cisterns are patent.  Persistent complete opacification of the right mastoid air cells and middle  ear cavity. Paranasal sinuses are clear.      Impression       1.No significant change in multifocal areas of subacute infarction within  bilateral cerebral hemispheres.  2.Small areas of petechial hemorrhage in the left parietal and occipital  lobes are unchanged. No new hemorrhage or measurable parenchymal hematoma.  3.Persistent complete opacification of the right mastoid air cells and  middle ear cavity.    Garnette Gunner, MD  07/26/2022 12:27 PM       Signed,  Melina Fiddler, MD  4:17 PM 08/02/2022

## 2022-08-02 NOTE — Progress Notes (Addendum)
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 s/p bilateral radical nephrectomies and  cystectomy on 06/28/22  - complicated by small bowel enterotomy and blood loss   New onset afib - on amiodarone and anticoagulation  High grade urothelial bladder CA: s/p bilateral nephrectomies and cystectomy c/b mall bowel enterotomy and blood loss (~1L) on 06/28/22  - awaiting wound debridement   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 - remains low, adequate iron - on aranesp  Encephalopathy - better  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   Hypophosphatemia -  resolved  HTN - BP elevated but fluctuant     Recommendations:   Next HD tomorrow  Stop calcium carbonate  Plan for SNF on d/c    Case discussed with: patient, daughter, CM    Debara Pickett, MD  [facility]  [phone] (office)  X [phone] ([facility] Spectra-Link)      Subjective:  pt lethargic, had dilaudid earlier for wound debridement    Review of Systems:   No SOB    Physical Exam:     Vitals:    08/02/22 0245 08/02/22 0332 08/02/22 0505 08/02/22 0737   BP: 157/59 146/62  164/61   Pulse: 70 69  73   Resp: 18   22   Temp: 98.8 F (37.1 C)   98.8 F (37.1 C)   TempSrc: Oral   Oral   SpO2: 95%   96%   Weight:   89.3 kg (196 lb 13.9 oz)    Height:           Intake and Output Summary (Last 24 hours) at Date Time    Intake/Output Summary (Last 24 hours) at 08/02/2022 1007  Last data filed at 08/02/2022 9563  Gross per 24 hour   Intake 220 ml   Output --   Net 220 ml         General: awake,  in no distress  Cardiovascular: regular rate and rhythm  Lungs: bilateral air entry  Abdomen: soft  Extremities: no edema  Access: R permacath     Meds:      Scheduled Meds: PRN Meds:    acetaminophen, 650 mg, Oral, 4 times per day   Or  acetaminophen, 650 mg, Oral, 4 times per day  amiodarone, 200 mg, per NG tube, Daily  atorvastatin, 40 mg, Oral, QHS  calcium carbonate, 500 mg, Oral, TID  carvedilol, 12.5 mg, Oral, Q12H SCH  darbepoetin alfa, 0.45 mcg/kg, Subcutaneous, Weekly  glycopyrrolate, 0.2 mg, Intravenous, Q8H  insulin glargine, 5 Units, Subcutaneous, QAM  insulin lispro, 1-3 Units, Subcutaneous, QHS  insulin lispro, 1-5 Units, Subcutaneous, TID AC  lidocaine, 20 mL, Intradermal, Once  pantoprazole, 40 mg, per NG tube, Q12H SCH  thiamine, 100 mg, per NG tube, Daily  umeclidinium-vilanterol, 1 puff, Inhalation, QAM  vitamins/minerals, 1 tablet, Oral, Daily          Continuous Infusions:   [Held by provider] heparin infusion 25,000  units/500 mL (Cardiac/Low Intensity) Stopped (08/02/22 0504)        sodium chloride, , PRN  acetaminophen, 650 mg, Q6H PRN  albuterol-ipratropium, 3 mL, Q6H PRN  benzocaine, 1 spray, TID PRN  dextrose, 15 g of glucose, PRN   Or  dextrose, 12.5 g, PRN   Or  dextrose, 12.5 g, PRN   Or  glucagon (rDNA), 1 mg, PRN  diphenhydrAMINE, 25 mg, Q6H PRN  hydrALAZINE, 10 mg, Q3H PRN  HYDROmorphone, 1 mg, Q4H PRN  labetalol, 10 mg, Q15 Min PRN  lidocaine 2% jelly, , Once PRN  naloxone, 0.2 mg, PRN  ondansetron, 4 mg, Q6H PRN  oxyCODONE, 2.5 mg, Q4H PRN  phenol, 1 spray, Q2H PRN              Labs:     Recent Labs   Lab 08/02/22  0527 08/01/22  0320 07/31/22  0754   WBC 13.08* 11.72* 11.91*   Hemoglobin 8.8* 9.4* 7.2*   Hematocrit 27.1* 28.0* 22.5*   Platelet Count 270 300 345     Recent Labs   Lab 08/02/22  0527 08/01/22  0320 07/31/22  0449   Sodium 139 140 136   Potassium 4.5 4.3 4.7   Chloride 101 103 99   CO2 23 25 24    BUN 34* 23* 43*   Creatinine 4.6* 3.2* 4.5*   Calcium 8.5 8.4 8.6    Albumin 1.6* 1.7*  --    Phosphorus 3.7 3.1 3.8   Magnesium 1.9 2.0 1.9   Glucose 99 94 82   GFR 8.8* 13.7* 9.1*           Signed by: Debara Pickett, MD

## 2022-08-02 NOTE — Plan of Care (Signed)
Patient alert and oriented to self, delayed response, room air. Wound vac remains in place. No distress noted. Scheduled medications given with apple sauce. Blood sugar monitor, no coverage needed.Safety and fall interventions remain in place. Purposeful rounding completed.   Problem: Moderate/High Fall Risk Score >5  Goal: Patient will remain free of falls  Outcome: Progressing  Flowsheets (Taken 08/02/2022 1949)  High (Greater than 13):   MOD-Use of chair-pad alarm when appropriate   MOD-Consider a move closer to [employer]   MOD-Remain with patient during toileting   MOD-Use of assistive devices -Bedside Commode if appropriate   MOD-Re-orient confused patients   MOD-Utilize diversion activities   MOD-Perform dangle, stand, walk (DSW) prior to mobilization   MOD-Include family in multidisciplinary POC discussions   MOD-Place Fall Risk level on whiteboard in room   HIGH-Visual cue at entrance to patient's room   HIGH-Bed alarm on at all times while patient in bed   HIGH-Utilize chair pad alarm for patient while in the chair   HIGH-Apply yellow "Fall Risk" arm band   HIGH-Initiate use of floor mats as appropriate   HIGH-Consider use of low bed     Problem: Compromised Activity/Mobility  Goal: Activity/Mobility Interventions  Outcome: Progressing  Flowsheets (Taken 08/02/2022 2207)  Activity/Mobility Interventions: Pad bony prominences, TAP Seated positioning system when OOB, Promote PMP, Reposition q 2 hrs / turn clock, Offload heels     Problem: Impaired Mobility  Goal: Mobility/Activity is maintained at optimal level for patient  Outcome: Progressing  Flowsheets (Taken 08/02/2022 2207)  Mobility/activity is maintained at optimal level for patient:   Encourage independent activity per ability   Consult/collaborate with Physical Therapy and/or Occupational Therapy     Problem: Safety  Goal: Patient will be free from injury during hospitalization  Outcome: Progressing  Flowsheets (Taken 08/02/2022 2207)  Patient will  be free from injury during hospitalization:   Provide alternative method of communication if needed ([employer], writing)   Include patient/ family/ care giver in decisions related to safety   Hourly rounding   Provide and maintain safe environment   Use appropriate transfer methods   Ensure appropriate safety devices are available at the bedside   Assess patient's risk for falls and implement fall prevention plan of care per policy     Problem: Every Day - Stroke  Goal: Mobility/Activity is maintained at optimal level for patient  Outcome: Progressing  Flowsheets (Taken 08/02/2022 2207)  Mobility/activity is maintained at optimal level for patient:   Encourage independent activity per ability   Consult/collaborate with Physical Therapy and/or Occupational Therapy

## 2022-08-02 NOTE — UM Notes (Signed)
[facility] Utilization Review  NPI: [id number], Tax ID: [id number]  Please Call [phone] with any questions or concerns. Confidential voicemail.  Fax final authorizations and requests for additional information Fax to [phone]     Patient: Shelby Barron  Date: 07/30/2022   LOS: 32 Days  Admission Date: 06/28/2022      CSR 07/30/22-08/02/22    Shelby Barron is a 82 y.o. 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 s/p bilateral radical nephrectomies and  cystectomy on 06/28/22  - complicated by small bowel enterotomy and blood loss   New onset afib - on amiodarone and anticoagulation  High grade urothelial bladder CA: s/p bilateral nephrectomies and cystectomy c/b mall bowel enterotomy and blood loss (~1L) on 06/28/22  - awaiting wound debridement   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 - remains low, adequate iron - on aranesp  Encephalopathy - better  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   Hypophosphatemia -  resolved  HTN - BP elevated but fluctuant      Nephrology Recommendations: 715/24   Next HD tomorrow  Stop calcium carbonate  Plan for SNF on d/c    Surgery Bedside Wound Debridement 08/02/22  Note:  Procedure, including indications and risks / benefits, discussed with patient and patient's daughter, [name]. Consent obtained from [place] given patient's fluctuating mental status.   Wound vac removed. Unhealthy tissue sharply debrided from right inferior aspect of wound  along with fibrinous exudate. Healthy tissue with good perfusion noted. Remainder of wound granulating well. Good hemostasis achieved.     08/02/22 0737    BP: 164/61   Pulse: 73   Resp: 22   Temp: 98.8 F (37.1 C)   SpO2: 96%     CSR 07/30/22    ASSESSMENT/PLAN     Toxic metabolic Encephalopathy/delirium resolving  - Patient was upgraded to ICU on 6/30 for lethargy and decreased responsiveness  - CT head was repeated on 6/29, no acute finding  - ABG consistent with respiratory alkalosis  - EEG consistent with encephalopathy no seizure, MRI no acute stroke  - CT abdomen with and without contrast after premedication did not show any leak or abscess  - SLP advanced diet to pureed.   -NG tube removed 7/9  -SLP following daily  - Palliative consulted:  Family is clear they would like no limits to care and patient should remain full code.     Anemia, 2/2 CKD and blood loss during OR  H&H 6.8/21  Will transfuse 1 units of PRBC during hemodialysis today  - TSAT 18%  - continue Aranesp per nephrology  - Trend CBC, transfuse Hgb<7. .  No active signs of bleeding     s/p open cystectomy, bilateral nephrectomies, and resection of small bowel after intra-op enterotomy    Mixed shock - hemorrhagic vs septic: Resolved  Leukocytosis  ?  Necrotic wound  tissue  -I reviewed the images from last wound care note on July 8 which showed some necrotic tissue     -I discussed with general surgery who agreed the patient needs debridement but needs to hold Eliquis for 2 days prior  Will hold Eliquis starting now and switch to heparin drip currently on hold pending debridement at bedside by general surgery team  Plan for bedside debridement by general surgery team today  Heparin held since a.m. resumed after debridement  I discussed with ID and plan for short course of antibiotic with debridement.  Short course of meropenem  - 6/10 small bowel enteretomy with 1L EBL. Went to the OR on June 12 for small bowel anastomosis, closure of mesenteric  defect, closure of abdominal wall with wound VAC application. Wound VAC last changed on 7/11  - Surgery consulted, recommend CTAP with PO contrast that showed no anastomotic leak. Low concern for abscesses as they'd expect these to have a more loculated appearance in the interim between 6/17 and 7/1 imaging  - ID consulted, completed meropenem and Mycamine for 2 weeks on 7/3.      Acute CVA on the right frontal lobe most likely from hypotension and shock  History of CVA  Petechial hemorrhage vs cortical laminar necrosis on CTH  - Left upper extremity not moving at all unclear duration  - Neurology following, initially started on ASA 81mg  only due to concern for risk of hemorrhagic conversion. Now OK to switch to Eliquis for Afib and discontinue aspirin per neurology on 7/5 as > 10d post event.  - SBP goal 120-160  - Carotid Doppler showed plaque with no critical stenosis  - Head CT on 7/7 w/ a few areas of cortical hyperdensity - petechial hemorrhage vs cortical laminar necrosis. Discussed with neuro OK to continue eliquis for now. Plan for repeat CTH today. Neuro exam stable.  - EEG encephalopathic pattern no seizure, repeat MRI no new infarction  - PT OT recommend SNF  -Repeat CT head on 07/26/2022 stable     Acute hypoxemic respiratory failure resolved  COPD  Extubated on 6/13, currently on room air  Maintain SaO2 > 92%  Continue as needed neb treatment  X-ray repeated on 6/29 showed resolution of interstitial edema     ESRD  S/p CRRT this admission, now on HD TTS  Continue dialysis per nephrology  IR removed the nonfunctional permacath and right [place] IJ cath and replaced with a new permacath on 6/26.     HTN  On metoprolol   PRN IV labetalol for SBP > 180      New onset Afib with RVR, improved  - CHA2DS2-VASc score is 7  - Status post amiodarone drip. Now stable on oral amiodarone and coreg per cardiology recommendations  - GI and general surgery cleared for anticoagulation. Neurology recommended to hold  anticoagulation initiation for 10 days given the size of stroke.   -  started on Eliquis since 07/23/22  Hold Eliquis starting today 7/10 for 2 days prior to wound debridement     Renal cell carcinoma outpatient follow-up hematology oncology  Urothelial carcinoma  Thrombocytopenia  - VTE ppx: SCDs, eliquis     Diabetes Mellitus type 2  -Sliding scale insulin     Nutrition: Pured diet as tolerated  Malnutrition Documentation     Moderate Malnutrition related to inadequate nutritional intake in the setting of acute illness as evidenced by <50% EER > 5 days, mild muscle losses (temporalis, deltoid) - new  Patient has BMI=Body mass index is 35.21 kg/m.  Diagnosis: Obesity based on BMI criteria        Lab 07/31/22  0754 07/31/22  0449 07/30/22  0317   Hemoglobin 7.2* 6.8* 7.1*   Hematocrit 22.5* 21.4* 22.1*   MCV 91.5 91.8 91.7   WBC 11.91* 11.63* 12.61*   Platelet Count 345 333 347*      Lab 07/18/22  0421 06/02/22  0514   Ferritin 2,300.21*  --    Iron 18* 62   TIBC 99* 145*   Iron Saturation 18 43      Anemia Diagnosis: Already documented in note     Code Status: Full Code     Dispo: SNF     Family Contact: Daughter [name]      DVT Prophylaxis:          Current Facility-Administered Medications (Includes Only Anticoagulants, Misc. Hematological)   Medication Dose Route Last Admin    [Held by provider] heparin 25,000 units in dextrose 5% 500 mL infusion (premix)  11 Units/kg/hr Intravenous Stopped at 07/31/22 0529           Lab 08/02/22  0527 08/01/22  0320 07/31/22  0754   WBC 13.08* 11.72* 11.91*   Hemoglobin 8.8* 9.4* 7.2*   Hematocrit 27.1* 28.0* 22.5*   Platelet Count 270 300 345       Rate: 19.9 mL/hr Dose: 11 Units/kg/hr  Weight Dosing Info: 90.5 kg  Freq: Continuous Route: IV  Last Dose: Stopped (08/02/22 0504)   Scheduled Meds:  Current Facility-Administered Medications   Medication Dose Route Frequency    acetaminophen  650 mg Oral 4 times per day    Or    acetaminophen  650 mg Oral 4 times per day     amiodarone  200 mg per NG tube Daily    apixaban  2.5 mg Oral Q12H SCH    atorvastatin  40 mg Oral QHS    carvedilol  12.5 mg Oral Q12H SCH    darbepoetin alfa  0.45 mcg/kg Subcutaneous Weekly    glycopyrrolate  0.2 mg Intravenous Q8H    insulin glargine  5 Units Subcutaneous QAM    insulin lispro  1-3 Units Subcutaneous QHS    insulin lispro  1-5 Units Subcutaneous TID AC    lidocaine  20 mL Intradermal Once    pantoprazole  40 mg per NG tube Q12H [facility]    thiamine  100 mg per NG tube Daily    umeclidinium-vilanterol  1 puff Inhalation QAM    vitamins/minerals  1 tablet Oral Daily     Continuous Infusions:  PRN Meds:.sodium chloride, acetaminophen, albuterol-ipratropium, benzocaine, dextrose **OR** dextrose **OR** dextrose **OR** glucagon (rDNA), diphenhydrAMINE, hydrALAZINE, HYDROmorphone, labetalol, lidocaine 2% jelly, naloxone, ondansetron, oxyCODONE, phenol

## 2022-08-02 NOTE — Significant Event (Signed)
Pt was found less responsive from baseline, lethargic and hard to arouse. Unable to follow command, minimal verbal response. Dilaudid last given 0933, PRN narcan given for overdose concern without improvement, MD aware. Stroke alert called 1425. Continue to monitor.

## 2022-08-02 NOTE — Progress Notes (Signed)
CM made call to pt's daughter [name] who reported she was driving and will follow up with this Clinical research associate.  CM will continue to follow and coordinate all d/c needs.

## 2022-08-02 NOTE — Progress Notes (Signed)
Nutritional Support Services  Nutrition Follow-up    Shelby Barron 82 y.o. female   MRN: [id number]    Summary of Nutrition Recommendations:    1. Continue current diet, textures per SLP    2. Adjust ONS to Magic Cup 1 PO BID to optimize nutritional intake   Each Magic Cup provides an additional 290 kcal and 9 gm protein.     3. Daily Weights     -----------------------------------------------------------------------------------------------------------------    D/w RN                                                       ASSESSMENT DATA     Subjective Nutrition: Patient seen at bedside, unable to participate in assessment. Family member at bedside, reporting pt with decreased intake of breakfast this AM but improved intake for lunch. Observed lunch tray at bedside, consumed mainly pudding and pureed veggie. Pt not drinking ONS, reportedly does not like. Will adjust to Magic Cup and monitor intake.    Learning Needs: no educational needs    Events of Current Admission:  82 yo female with PHMx COPD, CVA, DM, renal cell carcinoma s/p partial b/l nephrectomy, [name] (05/2022), new diagnosis of urothelial CA, presenting for scheduled cystectomy and complete nephrectomy. OR course c/b small bowel enterotomy and significant bleeding.   6/11: CRRT initiated  6/12: OR for small bowel anastomosis, closure of mesenteric defect, closure of abdominal wall with wound VAC application  6/13: Concern for prolonged NPO status, TPN initiated, Extubation  6/15: ROBF, TPN completed with plan for transition to PO/EN pending swallow. Transition from CRRT to HD   6/16: SLP eval, rec NPO.  6/17: Feeding tube placed and tube feeding initiated  7/2: CLD initiated   7/8: Diet advanced to Pureed   7/9: NGT removed, TF d/c  7/15: Plan for wound debridement with short course Abx    Medical Hx:  has a past medical history of Acid reflux, Asthma, well controlled, Cancer of kidney, Chronic lower back pain, Congestive heart disease, COPD (chronic  obstructive pulmonary disease), CVA (cerebral infarction) (01/12/2003), Diabetes, Diabetic nephropathy, Fibroids, Gout, H/O: gout, Hyperlipidemia, Hypertension, Neuropathy of hand, and SOB (shortness of breath).       Orders Placed This Encounter   Procedures    Adult diet Therapeutic/ Modified; Solid; Pureed (IDDSI level 4); Mildly thick (IDDSI level 2); Room service: No    Nepro Shake Quantity: A. One; Frequency: BID (2 times a day) - with Lunch and Dinner       Intake: avg intake ~50% of meals   07/28/22 1835 07/28/22 2200 07/29/22 0834   Intake (mL)   Percent Meal Consumed (%) 75%  --  50%   Percent Supplement Consumed (%)  --  0%  --       07/29/22 1000 07/30/22 0847 07/30/22 1301   Intake (mL)   Percent Meal Consumed (%)  --  50% 25%   Percent Supplement Consumed (%) 0% 0%  --       07/30/22 1700 07/31/22 1000 07/31/22 2200   Intake (mL)   Percent Meal Consumed (%) 50% 25%  --    Percent Supplement Consumed (%)  --   --  0%      08/01/22 0859 08/01/22 1915 08/02/22 0838   Intake (mL)   Percent Meal  Consumed (%) 25% 75% 50%   Percent Supplement Consumed (%)  --   --   --          ANTHROPOMETRIC  Height: 157.5 cm (5\' 2" )  Weight: 89.3 kg (196 lb 13.9 oz)  Weight Change: 4.32  Body mass index is 36.01 kg/m.     Weight History Summary: stable weights      ESTIMATED NEEDS    Total Daily Energy Needs: 1566 to 2001 kcal  Method for Calculating Energy Needs: 18 kcal - 23 kcal per kg  at 87 kg (Actual body weight)  Rationale: obese, noncritical, HD       Total Daily Protein Needs: 104.4 to 130.5 g  Method for Calculating Protein Needs: 2 g - 2.5 g per kg at 52.2 kg (Ideal body weight)  Rationale: obese, noncritical, HD      Total Daily Fluid Needs: 1044 to 1305 ml  Method for Calculating Fluid Needs: 20 ml - 25 ml  per kg at 52.2 kg (Ideal body weight)  Rationale: bmi, age    Pertinent Medications: SSI, MVI, lantus 5 units, protonix, thiamine  IVF:     [Held by provider] heparin infusion 25,000 units/500 mL  (Cardiac/Low Intensity) Stopped (08/02/22 0504)       Pertinent labs:  Recent Labs   Lab 08/02/22  0527 08/01/22  0320 07/31/22  0449 07/30/22  0317 07/29/22  0350   Sodium 139 140 136 137 142   Potassium 4.5 4.3 4.7 4.2 4.4   Chloride 101 103 99 100 103   CO2 23 25 24 26 25    BUN 34* 23* 43* 31* 59*   Creatinine 4.6* 3.2* 4.5* 3.2* 5.3*   Glucose 99 94 82 85 105*   Calcium 8.5 8.4 8.6 8.5 9.5   Magnesium 1.9 2.0 1.9 1.8 2.1   Phosphorus 3.7 3.1 3.8 2.7 3.3   GFR 8.8* 13.7* 9.1* 13.7* 7.5*       Physical Assessment: August 02, 2022  Head: temple region: slight depression with decrease in muscle tone/resistance (mild muscle loss - temporalis), orbital region: slightly bulged fat pads, ample bounce back, fluid retention may mask loss, dehydration may falsely appear as loss (no wasting observed), and buccal region: slight depression, somewhat sunken appearance, flat cheeks, decrease in bounce back of fat pads (mild fat loss)  Upper Body: clavicle bone region: some protrusion of the clavicle with decrease in muscle tone/resistance (mild muscle loss - pectoralis major), shoulder and acromion bone region: slight protrusion of acromion process, decrease in muscle tone/resistance (moderate muscle loss - deltoid), and upper arm region: some fat in pinch between fingers, but not ample (mild fat loss)  Lower Body: anterior thigh region: well-rounded, well-developed, patella not prominent, feel muscle tone/resistance in quadriceps to patella (no wasting observed) and posterior calf region: some shape to the bulb, but not well-developed, decrease in muscle tone/resistance (mild muscle loss - gastrocnemius)  Edema: edema: severe pitting(3-4+), indentation lasts 31 to >60 seconds, significant swelling (severe edema - LUE)  Skin: abd surgical incision per flow sheet  GI function: Last BM Date: 08/01/22                                                    NUTRITION DIAGNOSIS     Moderate Malnutrition related to inadequate nutritional  intake in the setting of  acute illness as evidenced by </=50% EER > 5 days, mild-moderate muscle losses (temporalis, prectoralis, deltoid, gastrocnemius), mild fat loss (buccal, triceps) - active                                                              INTERVENTION     Nutrition recommendation - Please refer to top of note                                                       MONITORING/EVALUATION     Goals:    1. Pt will consume >75% of meals and ONS by follow up - active     Nutrition Risk Level: High (will follow up at least 2 times per week and PRN)        Baxter Hire, RD, CNSC  Clinical Dietitian  [phone]

## 2022-08-02 NOTE — Progress Notes (Signed)
Infectious Diseases & Tropical Medicine  Progress Note    08/02/2022   Shelby Barron GNF:[id number],[id number] is a 82 y.o. female,       Assessment:     Encephalopathy  Acute CVA  History of bladder cancer and renal failure  Status post radical cystectomy/bilateral nephrectomy, enterectomy and lysis of adhesion (06/28/2022)  S/p small bowel resection  Abdominal wall wound s/p bedside debridement (08/02/2022)  End-stage renal disease on hemodialysis  Atrial fibrillation  Risk of aspiration    Plan:     Discontinue antibiotics  Wound care nurse following  Follow-up chest x-ray  Continue probiotics  Monitor electrolytes and renal functions closely  Monitor clinically  Discussed with patient's daughter in detail  Discussed with Dr.Al Najafi    ROS:     General: No fever, sleepy, daughter at bedside  HEENT: NG tube removed  Respiratory: No cough or shortness of breath  Gastrointestinal: Abdominal wall wound  Genito-Urinary: no hematuria  Musculoskeletal: no edema  Neurological: Sleepy  Dermatological: No rash    Physical Examination:     Blood pressure 164/61, pulse 73, temperature 98.8 F (37.1 C), temperature source Oral, resp. rate 22, height 1.575 m (5\' 2" ), weight 89.3 kg (196 lb 13.9 oz), SpO2 96%.    General Appearance: Comfortable  HEENT: Pupils are equal, round, and reactive to light.   Lungs: Clear to auscultation  Heart: Regular rate and rhythm  Chest: Symmetric chest wall expansion.   Abdomen: soft, no hepatosplenomegaly, no bowel sounds, abdominal wall wound clean post debridement  Neurological: Sleepy  Extremities: No edema    Laboratory And Diagnostic Studies:     Recent Labs     08/02/22  0527 08/01/22  0320   WBC 13.08* 11.72*   Hemoglobin 8.8* 9.4*   Hematocrit 27.1* 28.0*   Platelet Count 270 300     Recent Labs     08/02/22  0527 08/01/22  0320   Sodium 139 140   Potassium 4.5 4.3   Chloride 101 103   CO2 23 25   BUN 34* 23*   Creatinine 4.6* 3.2*   Glucose 99 94   Calcium 8.5 8.4     Recent Labs      08/02/22  0527 08/01/22  0320   AST (SGOT) 12 16   ALT 9 10   Alkaline Phosphatase 93 100   Protein, Total 4.4* 4.7*   Albumin 1.6* 1.7*         Current Meds:      Scheduled Meds: PRN Meds:    acetaminophen, 650 mg, Oral, 4 times per day   Or  acetaminophen, 650 mg, Oral, 4 times per day  amiodarone, 200 mg, per NG tube, Daily  atorvastatin, 40 mg, Oral, QHS  calcium carbonate, 500 mg, Oral, TID  carvedilol, 12.5 mg, Oral, Q12H SCH  darbepoetin alfa, 0.45 mcg/kg, Subcutaneous, Weekly  glycopyrrolate, 0.2 mg, Intravenous, Q8H  insulin glargine, 5 Units, Subcutaneous, QAM  insulin lispro, 1-3 Units, Subcutaneous, QHS  insulin lispro, 1-5 Units, Subcutaneous, TID AC  lidocaine, 20 mL, Intradermal, Once  meropenem, 500 mg, Intravenous, Q24H  pantoprazole, 40 mg, per NG tube, Q12H SCH  thiamine, 100 mg, per NG tube, Daily  umeclidinium-vilanterol, 1 puff, Inhalation, QAM  vitamins/minerals, 1 tablet, Oral, Daily        Continuous Infusions:   [Held by provider] heparin infusion 25,000 units/500 mL (Cardiac/Low Intensity) Stopped (08/02/22 0504)      sodium chloride, , PRN  acetaminophen, 650 mg, Q6H PRN  albuterol-ipratropium, 3 mL, Q6H PRN  benzocaine, 1 spray, TID PRN  dextrose, 15 g of glucose, PRN   Or  dextrose, 12.5 g, PRN   Or  dextrose, 12.5 g, PRN   Or  glucagon (rDNA), 1 mg, PRN  diphenhydrAMINE, 25 mg, Q6H PRN  hydrALAZINE, 10 mg, Q3H PRN  labetalol, 10 mg, Q15 Min PRN  lidocaine 2% jelly, , Once PRN  naloxone, 0.2 mg, PRN  ondansetron, 4 mg, Q6H PRN  oxyCODONE, 2.5 mg, Q4H PRN  phenol, 1 spray, Q2H PRN          Shelby Barron, M.D.  08/02/2022  9:14 AM

## 2022-08-02 NOTE — Significant Event (Signed)
Stroke alert cancelled, pt was able to awaken and answer a few question. ABG order placed.   MD and Stroke coordinator at bedside.

## 2022-08-03 LAB — PHOSPHORUS: Phosphorus: 4.7 mg/dL (ref 2.3–4.7)

## 2022-08-03 LAB — CBC
Absolute nRBC: 0 10*3/uL (ref <=0.00)
Hematocrit: 27.6 % — ABNORMAL LOW (ref 34.7–43.7)
Hemoglobin: 8.8 g/dL — ABNORMAL LOW (ref 11.4–14.8)
MCH: 29.1 pg (ref 25.1–33.5)
MCHC: 31.9 g/dL (ref 31.5–35.8)
MCV: 91.4 fL (ref 78.0–96.0)
MPV: 10.1 fL (ref 8.9–12.5)
Platelet Count: 266 10*3/uL (ref 142–346)
RBC: 3.02 10*6/uL — ABNORMAL LOW (ref 3.90–5.10)
RDW: 15 % (ref 11–15)
WBC: 13.17 10*3/uL — ABNORMAL HIGH (ref 3.10–9.50)
nRBC %: 0 /100 WBC (ref <=0.0)

## 2022-08-03 LAB — COMPREHENSIVE METABOLIC PANEL
ALT: 9 U/L (ref 0–55)
AST (SGOT): 12 U/L (ref 5–41)
Albumin/Globulin Ratio: 0.6 — ABNORMAL LOW (ref 0.9–2.2)
Albumin: 1.6 g/dL — ABNORMAL LOW (ref 3.5–5.0)
Alkaline Phosphatase: 94 U/L (ref 37–117)
Anion Gap: 16 — ABNORMAL HIGH (ref 5.0–15.0)
BUN: 45 mg/dL — ABNORMAL HIGH (ref 7–21)
Bilirubin, Total: 0.3 mg/dL (ref 0.2–1.2)
CO2: 22 mEq/L (ref 17–29)
Calcium: 8.8 mg/dL (ref 7.9–10.2)
Chloride: 102 mEq/L (ref 99–111)
Creatinine: 5.8 mg/dL — ABNORMAL HIGH (ref 0.4–1.0)
GFR: 6.7 mL/min/{1.73_m2} — ABNORMAL LOW (ref >=60.0)
Globulin: 2.9 g/dL (ref 2.0–3.6)
Glucose: 82 mg/dL (ref 70–100)
Potassium: 4.9 mEq/L (ref 3.5–5.3)
Protein, Total: 4.5 g/dL — ABNORMAL LOW (ref 6.0–8.3)
Sodium: 140 mEq/L (ref 135–145)

## 2022-08-03 LAB — WHOLE BLOOD GLUCOSE POCT
Whole Blood Glucose POCT: 79 mg/dL (ref 70–100)
Whole Blood Glucose POCT: 90 mg/dL (ref 70–100)
Whole Blood Glucose POCT: 143 mg/dL — ABNORMAL HIGH (ref 70–100)

## 2022-08-03 LAB — MAGNESIUM: Magnesium: 1.9 mg/dL (ref 1.6–2.6)

## 2022-08-03 MED ORDER — DARBEPOETIN ALFA 40 MCG/0.4ML IJ SOSY
0.4500 ug/kg | PREFILLED_SYRINGE | INTRAMUSCULAR | Status: DC
Start: 2022-08-03 — End: 2022-08-18
  Administered 2022-08-03 – 2022-08-17 (×2): 40 ug via SUBCUTANEOUS
  Filled 2022-08-03 (×3): qty 0.4

## 2022-08-03 MED ORDER — SODIUM CHLORIDE 0.9 % IV BOLUS
250.0000 mL | INTRAVENOUS | Status: AC | PRN
Start: 2022-08-03 — End: 2022-08-03

## 2022-08-03 MED ORDER — SODIUM CHLORIDE 0.9 % IV BOLUS
100.0000 mL | INTRAVENOUS | Status: AC | PRN
Start: 2022-08-03 — End: 2022-08-03

## 2022-08-03 MED ORDER — ALBUMIN HUMAN 25 % IV SOLN
100.0000 mL | INTRAVENOUS | Status: AC | PRN
Start: 2022-08-03 — End: 2022-08-03

## 2022-08-03 NOTE — Plan of Care (Signed)
 Problem: Renal Instability  Goal: Fluid and electrolyte balance are achieved/maintained  Flowsheets (Taken 07/29/2022 1755 by Lorriane Shire, RN)  Fluid and electrolyte balance are achieved/maintained:   Monitor/assess lab values and report abnormal values   Monitor for muscle weakness   Assess and reassess fluid and electrolyte status   Follow fluid restrictions/IV/PO parameters   Observe for cardiac arrhythmias     Problem: Patient Receiving Advanced Renal Therapies  Goal: Therapy access site remains intact  Flowsheets (Taken 07/29/2022 1100 by Bunnie Philips, RN)  Therapy access site remains intact:   Assess therapy access site   Change therapy access site dressing as needed

## 2022-08-03 NOTE — Progress Notes (Addendum)
SHIFT SUMMARY:    LOC: A&O X1, confused to place, situation , and time; frequently drowsy  CARDIOVASCULAR: regular  RESPIRATORY: clear, diminished in bases, RA  GI: + BS, + flatus, LBM 08-03-22, incontinent X2  GU: anuric per baseline with hx ESRD  INTEGUMENTARY: scattered bruising; skin tear to sacrum , wound care provided; scattered scars; blanchable erythema to skin folds  CL/DRAINS: [facility] Perma Cath (LUE Midline); wound Vac to abdomen  MOBILITY: patient has remained bed mobility with pillow support d/t generalized weakness  DIET: tolerating a pureed diet with thickened fluids, however appetite is poor; family to come to bedside with care giver to assist  PAIN: denies, no indication  TELE: yes, NSR  INTERPRETER: n/a    PLAN:   Continue with safety measures and pain management; continue with wound care; continue with HD every T, Tr, St; neurology, ID, WOCN, surgery and nephrology following; pt to discharge to SNF when medically stable, Case Management following.

## 2022-08-03 NOTE — PT Progress Note (Addendum)
Physical Therapy Note    Physical Therapy Treatment  Shelby Barron  Post Acute Care Therapy Recommendations   Discharge Recommendations:  SNF      DME needs IF patient is discharging home: No additional equipment/DME recommended at this time    Therapy discharge recommendations may change with patient status.  Please refer to most recent note for up-to-date recommendations.    [facility]: [facility] [address] [facility]  Bed: [phone].[id number]    ___________________________________________________    Time of treatment:  PT Received On: 08/03/22  Start Time: 1803  Stop Time: 1828  Time Calculation (min): 25 min       Chart Review and Collaboration with Care Team: 5 minutes, not included in above time.    PT Visit Number: 5    ___________________________________________________    Precautions:   Precautions  Weight Bearing Status: no restrictions  Aspiration Precautions: see SLP recommendations  Fall Risks: High, Impaired balance/gait, Impaired mobility, Muscle weakness, Mental status change, History of fall(s)  Seizure Precautions: Yes (comment date of last seizure)  Communication Precautions: Expressive aphasia, Receptive aphasia  Other Precautions: skin & pressure ulcer; SBP 120-160; L side flaccid (CA)    Personal Protective Equipment (PPE)  gloves and procedure mask    Updated X-Rays/Tests/Labs:  Lab Results   Component Value Date/Time    HGB 8.8 (L) 08/03/2022 06:37 AM    HGB 8.6 (L) 06/03/2022 04:23 AM    HCT 27.6 (L) 08/03/2022 06:37 AM    HCT 26.8 (L) 06/03/2022 04:23 AM    K 4.9 08/03/2022 06:37 AM    K 3.6 06/03/2022 04:23 AM    NA 140 08/03/2022 06:37 AM    NA 137 06/03/2022 04:23 AM    INR 1.1 07/14/2022 04:35 AM    INR 1.1 06/01/2022 03:15 AM    TROPI 9.6 05/26/2022 06:37 PM    TROPI -2.2 05/26/2022 06:37 PM    TROPI 11.8 05/26/2022 03:26 PM       All imaging reviewed, please see chart for details.      Subjective:  None stated     Patient Goal: pt does not state goal                Patient's medical condition  is appropriate for Physical Therapy intervention at this time.  Patient is agreeable to participation in the therapy session. Nursing clears patient for therapy.      Objective:  Observation of Patient/Vital Signs:  Vitals:    08/03/22 1917   BP: 147/69   Pulse: 84   Resp: 17   Temp: 99 F (37.2 C)   SpO2: 98%         Cognition/Neuro Status  Arousal/Alertness: Delayed responses to stimuli  Attention Span: Difficulty attending to directions  Orientation Level: Oriented to person;Oriented to place;Disoriented to time;Disoriented to situation  Memory: Decreased recall of recent events  Following Commands: Follows one step commands with repetition;Follows one step commands with increased time  Safety Awareness: maximal verbal instruction  Insights: Educated in safety awareness  Problem Solving: dependent    PROM there ex for heel slides, hip abd/add, ankle pumps and UE there ex PROM all planes x 10                      Functional Mobility  Rolling: Dependent  Scooting to HOB: Dependent        Distance Walked (ft) (Step 6,7): 0 Feet  mRS Scale - mRS Certified Providers  I am certified in mRS: Yes  mRS Assessment Source: Patient  mRS Value: 5 - Severe disability        Educated the Patient to role of physical therapy, plan of care, goals of therapy and discharge instructions with limited indication of understanding.  Additional education required..    Patient left in bed with all medical equipment in place and call bell and all personal items/needs within reach (of note, pt received without alarm in place).  RN notified of session outcome. CM team and attending have been previously notified of d/c recommendation; no updates to d/c recommendation since that time.      Assessment:  Pt received supine in bed. Pt dependent with bed mobility. PROM there ex performed to BLE and B UE. Pt was was positioned bed for optimal positioning. PT Continue to recommend SNF. Pt would continue to benefit from skilled PT  services to maximize functional potential and address deficits.                PMP Activity: Step 4 - Dangle at Bedside  Distance Walked (ft) (Step 6,7): 0 Feet    Plan:  Treatment/Interventions: Exercise, Gait training, Stair training, Neuromuscular re-education, Functional transfer training, LE strengthening/ROM, Endurance training, Cognitive reorientation, Patient/family training, Equipment eval/education, Bed mobility, Continued evaluation      PT Frequency: 2-3x/wk   Continue plan of care.    Goals:  Goals  Goal Formulation: With patient/family  Time for Goal Acheivement: By time of discharge  Goals: Select goal  Pt Will Go Supine To Sit: to maximize functional mobility and independence, with moderate assist, Not met  Pt Will Sit Edge of Bed: 1-2 min, to maximize functional mobility and independence, with moderate assist, Partly met  Pt Will Perform Sit to Stand: to maximize functional mobility and independence, with moderate assist, Not met  Pt Will Transfer Bed/Chair: with moderate assist, to maximize functional mobility and independence, Not met  Pt Will Ambulate: 31-50 feet, with rolling walker, with moderate assist, to maximize functional mobility and independence, Not met      Osvaldo Human [id number]  08/03/2022 8:13 PM      [facility]  Patient: Shelby Barron MRN#: [id number]  Unit: [facility] [address] [facility] Bed: [id number]

## 2022-08-03 NOTE — Progress Notes (Signed)
[facility]  PROGRESS NOTE      Patient: Shelby Barron  Date: 08/03/2022   LOS: 36 Days  Admission Date: 06/28/2022   MRN: [id number]  Attending: Lennox Pippins, MD  When on service as the attending, please contact me on Epic Secure Chat from 7 AM - 7 PM for non-urgent issues. For urgent matters use XTend page from 7 AM - 7 PM.       ASSESSMENT/PLAN     Shelby Barron is a 82 y.o. female admitted with Bladder cancer      [name], [name], type 2 diabetes, renal cell carcinoma status post partial bilateral nephrectomy, [name], new diagnosis of urothelial cancer initally presented on 6/10 for elective cystectomy and completion nephrectomy, [name], [name], extubated and off pressors since 6/13.   She has been along hospital course, complicated by acute stroke and new onset A-fib.  Patient started on anticoagulation on 7/5    H/H drop but no overt bleeding on 7/7. Transfused 1U pRBCs. Also started on Aranesp. CTH showed few areas of cortical hyperdensity - petechial hemorrhage vs cortical laminar necrosis. OK to continue with eliquis per neuro given stable neuro exam.  Repeated CT head stable  She had NG tube and she passed SLP and NG tube was removed  Her wound has necrotic tissue and she had bedside debridement by general surgery on 08/02/2022  She got Dilaudid in the morning for debridement and in the afternoon patient was found to be less responsive stroke code was called but was canceled patient was given Narcan and with frequent painful stimuli patient will wake up and was more responsive and back to her baseline     Anticipated Discharge: Hopefully within 24-48 hours     Barriers to Discharge: Clinical improvement     Family/Social/Case Mgr Assistance: Patient daughter needs to choose SNF place  Toxic metabolic Encephalopathy/delirium resolving  - On and off confusion alert  oriented x 1-2, now relatively stable the last few days  - Patient was upgraded to ICU on 6/30 for lethargy and decreased responsiveness  - CT head was repeated on 6/29, no acute finding  - ABG consistent with respiratory alkalosis  - EEG consistent with encephalopathy no seizure, MRI no acute stroke  - CT abdomen with and without contrast after premedication did not show any leak or abscess  - SLP advanced diet. NG tube removed 7/9  - Palliative consulted:  Family is clear they would like no limits to care and patient should remain full code.     Anemia, 2/2 CKD and blood loss during OR  H&H 6.8/21  Will transfuse 1 units of PRBC during hemodialysis today  - TSAT 18%  - continue Aranesp per nephrology  - Trend CBC, transfuse Hgb<7. .  No active signs of bleeding    s/p open cystectomy, bilateral nephrectomies, and resection of small bowel after intra-op enterotomy    Mixed shock - hemorrhagic vs septic: Resolved  Necrotic wound tissue  - Status post wound debridement by general surgery at bedside on 7/15  - 6/10 small bowel enteretomy with 1L EBL. Went to the OR on June 12 for small bowel anastomosis, closure of mesenteric defect, closure of abdominal wall with wound VAC application. Wound VAC last changed on 7/15  - Surgery consulted, recommend CTAP with PO contrast that showed  no anastomotic leak. Low concern for abscesses as they'd expect these to have a more loculated appearance in the interim between 6/17 and 7/1 imaging  - ID consulted, completed meropenem and Mycamine for 2 weeks on 7/3. No further abx recommended after bedside debridement on 7/15        Acute CVA on the right frontal lobe most likely from hypotension and shock  History of CVA  Petechial hemorrhage vs cortical laminar necrosis on CTH  - Left upper extremity not moving at all unclear duration  - Neurology following, initially started on ASA 81mg  only due to concern for risk of hemorrhagic conversion. Now OK to switch to Eliquis for Afib and  discontinue aspirin per neurology on 7/5 as > 10d post event.  - SBP goal 120-160  - Carotid Doppler showed plaque with no critical stenosis  - Head CT on 7/7 w/ a few areas of cortical hyperdensity - petechial hemorrhage vs cortical laminar necrosis. Discussed with neuro OK to continue eliquis for now. Plan for repeat CTH today. Neuro exam stable.  - EEG encephalopathic pattern no seizure, repeat MRI no new infarction  - PT OT recommend SNF  -Repeat CT head on 07/26/2022 stable     Acute hypoxemic respiratory failure resolved  COPD  Extubated on 6/13, currently on room air  Maintain SaO2 > 92%  Continue as needed neb treatment  X-ray repeated on 6/29 showed resolution of interstitial edema     ESRD  S/p CRRT this admission, now on HD TTS  Continue dialysis per nephrology  IR removed the nonfunctional permacath and right [place] IJ cath and replaced with a new permacath on 6/26.     HTN  On metoprolol   PRN IV labetalol for SBP > 180      New onset Afib with RVR, improved  - CHA2DS2-VASc score is 7  - Status post amiodarone drip. Now stable on oral amiodarone and coreg per cardiology recommendations  - GI and general surgery cleared for anticoagulation. Neurology recommended to hold anticoagulation initiation for 10 days given the size of stroke.   -  started on Eliquis since 07/23/22  Hold Eliquis starting today 7/10 for 2 days prior to wound debridement     Renal cell carcinoma outpatient follow-up hematology oncology  Urothelial carcinoma  Thrombocytopenia  - VTE ppx: SCDs, eliquis     Diabetes Mellitus type 2  -Sliding scale insulin  Blood glucose was 90 early morning acting insulin to be hold for now                     Nutrition: Pured diet as tolerated  Malnutrition Documentation    Moderate Malnutrition related to inadequate nutritional intake in the setting of acute illness as evidenced by <50% EER > 5 days, mild muscle losses (temporalis, deltoid) - new                 Patient has BMI=Body mass index is 36.33  kg/m.  Diagnosis: Obesity based on BMI criteria                       Recent Labs   Lab 08/03/22  0637 08/02/22  0527 08/01/22  0320   Hemoglobin 8.8* 8.8* 9.4*   Hematocrit 27.6* 27.1* 28.0*   MCV 91.4 90.6 89.5   WBC 13.17* 13.08* 11.72*   Platelet Count 266 270 300     Recent Labs   Lab 07/18/22  0421 06/02/22  1610   Ferritin 2,300.21*  --    Iron 18* 62   TIBC 99* 145*   Iron Saturation 18 43     Anemia Diagnosis: Already documented in note    Code Status: Full Code    Dispo: SNF    Family Contact: Daughter [name] at bedside    DVT Prophylaxis:   Current Facility-Administered Medications (Includes Only Anticoagulants, Misc. Hematological)   Medication Dose Route Last Admin    albumin human 25 % bag 25 g  100 mL Intravenous      apixaban (ELIQUIS) tablet 2.5 mg  2.5 mg Oral 2.5 mg at 08/03/22 0841          CHART  REVIEW & DISCUSSION     The following chart items were reviewed as of 11:14 AM on 08/03/22:  [x]  Lab Results [x]  Imaging Results   [x]  Problem List  [x]  Current Orders [x]  Current Medications  []  Allergies  [x]  Code Status [x]  Previous Notes   []  SDoH    The management and plan of care for this patient was discussed with the following specialty consultants:  []  Cardiology  []  Gastroenterology                 [x]  Infectious Disease  []  Pulmonology []  Neurology                [x]  Nephrology  []  Neurosurgery []  Orthopedic Surgery  []  Heme/Onc  [x]  General Surgery []  Psychiatry                                   []  Palliative    SUBJECTIVE     [name] w  Seen in HD  [facility] today  MEDICATIONS     Current Facility-Administered Medications   Medication Dose Route Frequency    acetaminophen  650 mg Oral 4 times per day    Or    acetaminophen  650 mg Oral 4 times per day    amiodarone  200 mg per NG tube Daily    apixaban  2.5 mg Oral Q12H SCH    atorvastatin  40 mg Oral QHS    carvedilol  12.5 mg Oral Q12H SCH    darbepoetin alfa  0.45 mcg/kg Subcutaneous Weekly    glycopyrrolate  0.2 mg Intravenous Q8H     insulin glargine  5 Units Subcutaneous QAM    insulin lispro  1-3 Units Subcutaneous QHS    insulin lispro  1-5 Units Subcutaneous TID AC    lidocaine  20 mL Intradermal Once    pantoprazole  40 mg per NG tube Q12H [facility]    thiamine  100 mg per NG tube Daily    umeclidinium-vilanterol  1 puff Inhalation QAM    vitamins/minerals  1 tablet Oral Daily       PHYSICAL EXAM     Vitals:    08/03/22 1030   BP: 142/68   Pulse: 74   Resp:    Temp:    SpO2:        Temperature: Temp  Min: 98.1 F (36.7 C)  Max: 98.8 F (37.1 C)  Pulse: Pulse  Min: 65  Max: 90  Respiratory: Resp  Min: 16  Max: 20  Non-Invasive BP: BP  Min: 120/76  Max: 173/70  Pulse Oximetry SpO2  Min: 93 %  Max: 100 %    Intake and Output Summary (Last 24  hours) at Date Time    Intake/Output Summary (Last 24 hours) at 08/03/2022 1114  Last data filed at 08/03/2022 0900  Gross per 24 hour   Intake 330 ml   Output 0 ml   Net 330 ml         GEN APPEARANCE: Alert, awake and oriented x 1  HEENT: PERLA; EOMI; Conjunctiva Clear  NECK: Supple; No bruits  CVS: RRR, S1, S2; No M/G/R  LUNGS: CTAB; , no Rhonchi: No rales  ABD: Soft; No TTP; + Normoactive BS vertical abdominal wound and wound VAC in place  EXT: No edema; Pulses 2+ and intact  NEURO: moving right upper extremity spontaneously, able to move lower extremities for pain but minimal, no movement at all on the left upper extremity      LABS     Recent Labs   Lab 08/03/22  0637 08/02/22  0527 08/01/22  0320   WBC 13.17* 13.08* 11.72*   RBC 3.02* 2.99* 3.13*   Hemoglobin 8.8* 8.8* 9.4*   Hematocrit 27.6* 27.1* 28.0*   MCV 91.4 90.6 89.5   Platelet Count 266 270 300       Recent Labs   Lab 08/03/22  0637 08/02/22  0527 08/01/22  0320 07/31/22  0449 07/30/22  0317   Sodium 140 139 140 136 137   Potassium 4.9 4.5 4.3 4.7 4.2   Chloride 102 101 103 99 100   CO2 22 23 25 24 26    BUN 45* 34* 23* 43* 31*   Creatinine 5.8* 4.6* 3.2* 4.5* 3.2*   Glucose 82 99 94 82 85   Calcium 8.8 8.5 8.4 8.6 8.5   Magnesium 1.9 1.9 2.0  1.9 1.8       Recent Labs   Lab 08/03/22  0637 08/02/22  0527 08/01/22  0320   ALT 9 9 10    AST (SGOT) 12 12 16    Bilirubin, Total 0.3 0.2 0.3   Albumin 1.6* 1.6* 1.7*   Alkaline Phosphatase 94 93 100             Recent Labs   Lab 08/02/22  0527 08/01/22  1230 08/01/22  0408   PTT 83* 52* 48*       Microbiology Results (last 15 days)       ** No results found for the last 360 hours. **             RADIOLOGY     XR Chest AP Portable    Result Date: 08/02/2022   Hypoventilation with basilar atelectasis. Mild central vascular congestion. Charlott Rakes, MD 08/02/2022 3:23 PM    CT Head WO Contrast    Result Date: 07/26/2022   1.No significant change in multifocal areas of subacute infarction within bilateral cerebral hemispheres. 2.Small areas of petechial hemorrhage in the left parietal and occipital lobes are unchanged. No new hemorrhage or measurable parenchymal hematoma. 3.Persistent complete opacification of the right mastoid air cells and middle ear cavity. Garnette Gunner, MD 07/26/2022 12:27 PM    CT Head WO Contrast    Result Date: 07/25/2022   1.Redemonstration of multifocal areas of bilateral acute infarctions. A few areas of cortical hyperdensity likely represent petechial hemorrhage. No mass occupying hematoma. No significant mass effect or midline shift. 2.Redemonstrated right mastoid and right middle ear cavity effusion with areas of bony thinning versus dehiscence of the right tegmen tympani and right tegmen mastoideum. Vara Guardian, MD 07/25/2022 2:19 PM    CT Abdomen Pelvis W IV And  PO Cont    Result Date: 07/19/2022   1.Slight interval increase in left-sided retroperitoneal fluid collection with the other abdominal fluid collections appearing relatively stable. 2. There is no definite abscess or acute inflammatory process. 3. Incidental note is made of a small paraesophageal type hernia. 4. Sigmoid diverticulosis is noted without CT evidence of diverticulitis. 5. Other findings as noted above. Miguel Dibble, MD 07/19/2022  6:36 AM    MRI Brain WO Contrast    Result Date: 07/18/2022   Interval evolution of multifocal recent infarcts in the bilateral cerebral hemispheres. No new infarct. Milan Waunita Schooner, MD 07/18/2022 5:57 PM    XR Chest AP Portable    Result Date: 07/17/2022  1.Near-complete resolution of interstitial edema. 2.Improving pulmonary vascular congestion. Legrand Pitts, MD 07/17/2022 2:38 PM    CT Head WO Contrast    Result Date: 07/17/2022   1.No new acute intracranial pathology. 2.There are multiple regions of cytotoxic edema in the right > left frontal and left > right occipital lobes, consistent with known early subacute infarcts. Dannielle Burn 07/17/2022 8:26 AM    US Carotid Duplex Dopp Comp Bilateral    Result Date: 07/15/2022   1. Plaque formation in the right internal carotid artery bulb with elevated velocities. Stenosis is estimated at 50-69%. 2. Plaque in the left internal carotid artery with normal velocities. Any stenosis is less than 50%. Criteria for stenosis and velocity parameters are based on the guidelines of the SRU (Society of Radiologists in Ultrasound). Grant et al: Carotid Artery Stenosis: Grayscale and Doppler Ultrasound Diagnosis-- Society of Radiologists in Ultrasound Consensus Conference Ultrasound Quarterly. Volume 19, Number 4; December, 2003. Recommendations updated October 2021 Verne Carrow is an [facility] accredited facility Suszanne Finch, MD 07/15/2022 9:41 AM    Tunneled Cath Removal (Permcath)    Result Date: 07/14/2022  1. Nonfunctioning tunneled dialysis catheter as described above. 2. Successful placement of a new 19 cm Equistream catheter as described above. Dimitrios Papadouris, MD 07/14/2022 4:06 PM    Tunneled Cath Placement (Permcath)    Result Date: 07/14/2022  1. Nonfunctioning tunneled dialysis catheter as described above. 2. Successful placement of a new 19 cm Equistream catheter as described above. Dimitrios Papadouris, MD 07/14/2022 4:06 PM    MRI Brain WO Contrast    Result Date:  07/13/2022   1.Patchy areas of acute/recent infarction involving the cerebral hemispheres bilaterally. The infarcts have a configuration suggesting watershed territory infarcts. 2.Chronic ischemic changes as discussed. Georgann Housekeeper, MD 07/13/2022 10:28 PM    CT Head WO Contrast    Result Date: 07/13/2022   1.New region of loss of gray-white matter differentiation in the right frontal lobe, concerning for acute infarction. 2.A few additional areas of poor gray-white matter differentiation for example in the left occipital lobe may be artifactual but could be correlated with MRI. 3.No evidence of acute intracranial hemorrhage or significant mass effect. These urgent results were discussed with and acknowledged by Dr. Don Broach on 07/13/2022 3:20 PM. Vara Guardian, MD 07/13/2022 3:21 PM    XR Chest AP Portable    Result Date: 07/10/2022  Interstitial markings have slightly increased, suggesting minimal edema. No other change. Wilmon Pali, MD 07/10/2022 4:49 PM    XR Chest AP Portable    Result Date: 07/07/2022   There is mild interstitial prominence suggesting mild congestive heart failure/fluid overload in the setting of shortness of breath [name], MD 07/07/2022 12:32 PM    XR Chest AP Portable    Result Date: 07/06/2022  Hypoventilation with basilar atelectasis. Charlott Rakes, MD 07/06/2022 3:35 PM    CT Abdomen Pelvis W IV And PO Cont    Result Date: 07/06/2022  1.Postoperative changes from bilateral nephrectomy and cystectomy. 2.Small amount of ascites including some areas which appear loculated. Small collections of fluid and gas in the anterior midline abdomen may be postoperative but superimposed infection is not excluded. No rim-enhancing fluid collections or extravasation of oral contrast to suggest anastomotic leak. Demetrios Isaacs, MD 07/06/2022 5:51 AM    CT Head WO Contrast    Result Date: 07/06/2022   1.No acute intracranial abnormality is seen. Leandro Reasoner, MD 07/06/2022 3:56 AM    XR Abdomen AP    Result Date:  07/05/2022  Enteric tube terminates in the distal stomach. Elease Etienne, DO 07/05/2022 12:24 PM   Echo Results       None          Results for orders placed or performed during the hospital encounter of 06/28/22   MRI Brain WO Contrast    Narrative    HISTORY: Declining mental status, right gaze deviation.     COMPARISON: CT head 07/17/2022 and MRI brain 07/13/2022.     TECHNIQUE: MRI of the brain performed on a 1.5 Tesla scanner without  intravenous contrast.    CONTRAST: None.    FINDINGS:     Interval evolution of multifocal recent infarcts with associated edema in  the bilateral cerebral hemispheres. No new infarct. Mild chronic small  vessel ischemic changes within the cerebral white matter. Mild generalized  cerebral volume loss.     No acute hemorrhage, extra-axial fluid collection, or midline shift. No  mass lesion or vasogenic edema.     Normal appearance of the sella. Cerebellar tonsils are appropriately  positioned. No acute hydrocephalus. Patent basal cisterns. Skull base flow  voids are intact.    No osseous abnormality. No acute orbital abnormality. Status post bilateral  lens replacement. No significant paranasal sinus mucosal disease. Severe  opacification of the right mastoid air cells and middle ear cavity. Mild  opacification of left mastoid air cells.       Impression         Interval evolution of multifocal recent infarcts in the bilateral cerebral  hemispheres. No new infarct.    Milan Waunita Schooner, MD  07/18/2022 5:57 PM   CT Head WO Contrast    Narrative    HISTORY: Petechial hemorrhage follow up.     COMPARISON: Multiple priors most recent CT 07/25/2022    TECHNIQUE: CT of the head performed without intravenous contrast. The  following dose reduction techniques were utilized: automated exposure  control and/or adjustment of the mA and/or KV according to patient size,  and the use of an iterative reconstruction technique.    CONTRAST: None.    FINDINGS:  Multifocal areas of subacute  infarction within bilateral cerebral  hemispheres remain similar to prior. Small areas of petechial hemorrhage in  the left parietal and occipital lobes are unchanged. No new hemorrhage or  measurable parenchymal hematoma.    There is a background moderate chronic microangiopathic changes in the  periventricular white matter. Moderate generalized volume loss. No  extra-axial fluid collection or midline shift. Ventricles and basal  cisterns are patent.    Persistent complete opacification of the right mastoid air cells and middle  ear cavity. Paranasal sinuses are clear.      Impression       1.No significant change in multifocal areas of  subacute infarction within  bilateral cerebral hemispheres.  2.Small areas of petechial hemorrhage in the left parietal and occipital  lobes are unchanged. No new hemorrhage or measurable parenchymal hematoma.  3.Persistent complete opacification of the right mastoid air cells and  middle ear cavity.    Garnette Gunner, MD  07/26/2022 12:27 PM       Signed,  Lennox Pippins, MD  11:14 AM 08/03/2022

## 2022-08-03 NOTE — Progress Notes (Signed)
Infectious Diseases & Tropical Medicine  Progress Note    08/03/2022   Shelby Barron ZOX:[id number] is a 82 y.o. female,       Assessment:     Encephalopathy  Acute CVA  History of bladder cancer and renal failure  Status post radical cystectomy/bilateral nephrectomy, enterectomy and lysis of adhesion (06/28/2022)  S/p small bowel resection  Abdominal wall wound s/p bedside debridement (08/02/2022)  End-stage renal disease on hemodialysis  Atrial fibrillation  Risk of aspiration  Leukocytosis stable    Plan:     Continue monitoring without antibiotics  Continue local wound care  Follow-up chest x-ray  Continue probiotics  Monitor electrolytes and renal functions closely  Monitor clinically    ROS:     General: No fever, sleepy but arousable, receiving hemodialysis  Respiratory: No cough or shortness of breath  Gastrointestinal: No abdominal wall pain  Genito-Urinary: no hematuria  Musculoskeletal: no edema  Neurological: Sleepy  Dermatological: No rash    Physical Examination:     Blood pressure 120/76, pulse 90, temperature 98.1 F (36.7 C), temperature source Oral, resp. rate 17, height 1.575 m (5\' 2" ), weight 90.1 kg (198 lb 10.2 oz), SpO2 99%.    General Appearance: Appears comfortable  HEENT: Pupils are equal, round, and reactive to light.   Lungs: Clear to auscultation  Heart: Regular rate and rhythm  Chest: Symmetric chest wall expansion.   Abdomen: soft, no hepatosplenomegaly, no bowel sounds, abdominal wall wound clean post debridement  Neurological: Sleepy  Extremities: No edema    Laboratory And Diagnostic Studies:     Recent Labs     08/03/22  0637 08/02/22  0527   WBC 13.17* 13.08*   Hemoglobin 8.8* 8.8*   Hematocrit 27.6* 27.1*   Platelet Count 266 270     Recent Labs     08/03/22  0637 08/02/22  0527   Sodium 140 139   Potassium 4.9 4.5   Chloride 102 101   CO2 22 23   BUN 45* 34*   Creatinine 5.8* 4.6*   Glucose 82 99   Calcium 8.8 8.5     Recent Labs     08/03/22  0637 08/02/22  0527    AST (SGOT) 12 12   ALT 9 9   Alkaline Phosphatase 94 93   Protein, Total 4.5* 4.4*   Albumin 1.6* 1.6*         Current Meds:      Scheduled Meds: PRN Meds:    acetaminophen, 650 mg, Oral, 4 times per day   Or  acetaminophen, 650 mg, Oral, 4 times per day  amiodarone, 200 mg, per NG tube, Daily  apixaban, 2.5 mg, Oral, Q12H SCH  atorvastatin, 40 mg, Oral, QHS  carvedilol, 12.5 mg, Oral, Q12H SCH  darbepoetin alfa, 0.45 mcg/kg, Subcutaneous, Weekly  glycopyrrolate, 0.2 mg, Intravenous, Q8H  insulin glargine, 5 Units, Subcutaneous, QAM  insulin lispro, 1-3 Units, Subcutaneous, QHS  insulin lispro, 1-5 Units, Subcutaneous, TID AC  lidocaine, 20 mL, Intradermal, Once  pantoprazole, 40 mg, per NG tube, Q12H SCH  thiamine, 100 mg, per NG tube, Daily  umeclidinium-vilanterol, 1 puff, Inhalation, QAM  vitamins/minerals, 1 tablet, Oral, Daily        Continuous Infusions:       sodium chloride, , PRN  acetaminophen, 650 mg, Q6H PRN  albumin human, 100 mL, PRN  albuterol-ipratropium, 3 mL, Q6H PRN  benzocaine, 1 spray, TID PRN  dextrose, 15 g of glucose, PRN   Or  dextrose, 12.5 g, PRN   Or  dextrose, 12.5 g, PRN   Or  glucagon (rDNA), 1 mg, PRN  diphenhydrAMINE, 25 mg, Q6H PRN  hydrALAZINE, 10 mg, Q3H PRN  HYDROmorphone, 1 mg, Q4H PRN  labetalol, 10 mg, Q15 Min PRN  lidocaine 2% jelly, , Once PRN  naloxone, 0.2 mg, PRN  ondansetron, 4 mg, Q6H PRN  oxyCODONE, 2.5 mg, Q4H PRN  phenol, 1 spray, Q2H PRN  sodium chloride, 100 mL, Q1H PRN  sodium chloride, 250 mL, PRN          Donnae Michels A. Janalyn Rouse, M.D.  08/03/2022  9:32 AM

## 2022-08-03 NOTE — Plan of Care (Signed)
Problem: Moderate/High Fall Risk Score >5  Goal: Patient will remain free of falls  Outcome: Progressing     Problem: Compromised Sensory Perception  Goal: Sensory Perception Interventions  Outcome: Progressing     Problem: Compromised Moisture  Goal: Moisture level Interventions  Outcome: Progressing     Problem: Compromised Activity/Mobility  Goal: Activity/Mobility Interventions  Outcome: Progressing     Problem: Compromised Nutrition  Goal: Nutrition Interventions  Outcome: Progressing     Problem: Compromised Friction/Shear  Goal: Friction and Shear Interventions  Outcome: Progressing     Problem: Inadequate Airway Clearance  Goal: Patent Airway maintained  Outcome: Progressing  Goal: Normal respiratory rate/effort achieved/maintained  Outcome: Progressing     Problem: Infection Prevention  Goal: Free from infection  Outcome: Progressing     Problem: Impaired Mobility  Goal: Mobility/Activity is maintained at optimal level for patient  Outcome: Progressing     Problem: Safety  Goal: Patient will be free from injury during hospitalization  Outcome: Progressing  Goal: Patient will be free from infection during hospitalization  Outcome: Progressing     Problem: Pain  Goal: Pain at adequate level as identified by patient  Outcome: Progressing     Problem: Side Effects from Pain Analgesia  Goal: Patient will experience minimal side effects of analgesic therapy  Outcome: Progressing     Problem: Patient Receiving Advanced Renal Therapies  Goal: Therapy access site remains intact  Outcome: Progressing     Problem: Fluid and Electrolyte Imbalance/ Endocrine  Goal: Fluid and electrolyte balance are achieved/maintained  Outcome: Progressing  Goal: Adequate hydration  Outcome: Progressing     Problem: Diabetes: Glucose Imbalance  Goal: Blood glucose stable at established goal  Outcome: Progressing     Problem: Neurological Deficit  Goal: Neurological status is stable or improving  Outcome: Progressing     Problem:  Potential for Aspiration  Goal: Risk of aspiration will be minimized  Outcome: Progressing     Problem: Every Day - Stroke  Goal: Mobility/Activity is maintained at optimal level for patient  Outcome: Progressing  Goal: Elimination patterns are normal or improving  Outcome: Progressing  Goal: Neurological status is stable or improving  Outcome: Progressing  Goal: Core/Quality measure requirements - Daily  Outcome: Progressing  Goal: Stable vital signs and fluid balance  Outcome: Progressing  Goal: Patient will maintain adequate oxygenation  Outcome: Progressing  Goal: Patient's risk of aspiration will be minimized  Outcome: Progressing  Goal: Nutritional intake is adequate  Outcome: Progressing  Goal: Skin integrity is maintained or improved  Outcome: Progressing  Goal: Neurovascular status is stable or improving  Outcome: Progressing  Goal: Effective coping demonstrated  Outcome: Progressing  Goal: Will be able to express needs and understand communication  Outcome: Progressing     Problem: Pain interferes with ability to perform ADL  Goal: Pain at adequate level as identified by patient  Outcome: Progressing

## 2022-08-03 NOTE — Progress Notes (Signed)
CM spoke with pt's daughter [name] at length regard d/c planning.  Per daughter, she has toured both [place] and [place] and was not satisfied with the condition of the SNF.  CM further educated her on other SNF that may potentially have a Private Room and daughter was opened to visiting those SNF.      CM also made call to [facility] to inquire on whether they had any Private Rooms available and to contact pt's daughter to discuss further.  CM will continue to follow and coordinate all d/c needs.

## 2022-08-03 NOTE — Progress Notes (Signed)
Patient returned form HD in stable condition, although drowsy. Patient endorses no pain. Patient's daughter called for an update and states she will be by this afternoon to visit and bring her care giver to feed patient.

## 2022-08-03 NOTE — Progress Notes (Addendum)
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 s/p bilateral radical nephrectomies and  cystectomy on 06/28/22  - complicated by small bowel enterotomy and blood loss   New onset afib - on amiodarone and anticoagulation  High grade urothelial bladder CA: s/p bilateral nephrectomies and cystectomy c/b mall bowel enterotomy and blood loss (~1L) on 06/28/22  - awaiting wound debridement   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 - remains low, adequate iron - on aranesp  Encephalopathy - better  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   HTN - BP better     Recommendations:   S/p HD today with 2.4L net UF  Plan for routine HD again on Thursday   Will give aranesp today, missed on 7/13  Plan for SNF on d/c    Case discussed with: patient    Delice Bison, MD  [facility]  [phone] (office)  X [phone] (FF[facility] Spectra-Link)      Subjective: awake, in no distress    Review of Systems:   No SOB    Physical Exam:     Vitals:    08/03/22 1230 08/03/22 1245 08/03/22 1300 08/03/22 1308   BP: 126/60 108/59 105/57 117/59   Pulse: 90 86 87 87   Resp:    16   Temp:    98 F (36.7 C)   TempSrc:       SpO2:    98%   Weight:       Height:           Intake and Output Summary (Last 24 hours) at Date Time    Intake/Output Summary (Last 24 hours) at 08/03/2022 1550  Last data filed at 08/03/2022 1308  Gross per 24 hour   Intake 210 ml   Output 2442 ml   Net -2232 ml         General: awake, in no distress  Cardiovascular: regular rate and rhythm  Lungs:  bilateral air entry  Abdomen: soft  Extremities: no edema  Access: R permacath     Meds:      Scheduled Meds: PRN Meds:    acetaminophen, 650 mg, Oral, 4 times per day   Or  acetaminophen, 650 mg, Oral, 4 times per day  amiodarone, 200 mg, per NG tube, Daily  apixaban, 2.5 mg, Oral, Q12H SCH  atorvastatin, 40 mg, Oral, QHS  carvedilol, 12.5 mg, Oral, Q12H SCH  darbepoetin alfa, 0.45 mcg/kg, Subcutaneous, Weekly  glycopyrrolate, 0.2 mg, Intravenous, Q8H  insulin glargine, 5 Units, Subcutaneous, QAM  insulin lispro, 1-3 Units, Subcutaneous, QHS  insulin lispro, 1-5 Units, Subcutaneous, TID AC  lidocaine, 20 mL, Intradermal, Once  pantoprazole, 40 mg, per NG tube, Q12H SCH  thiamine, 100 mg, per NG tube, Daily  umeclidinium-vilanterol, 1 puff, Inhalation, QAM  vitamins/minerals, 1 tablet, Oral, Daily          Continuous Infusions:         sodium chloride, , PRN  acetaminophen, 650 mg, Q6H PRN  albuterol-ipratropium, 3 mL, Q6H PRN  benzocaine, 1 spray, TID PRN  dextrose, 15 g of glucose, PRN   Or  dextrose, 12.5 g, PRN   Or  dextrose, 12.5 g, PRN   Or  glucagon (rDNA), 1 mg, PRN  diphenhydrAMINE, 25 mg, Q6H PRN  hydrALAZINE, 10 mg, Q3H PRN  HYDROmorphone, 1 mg, Q4H PRN  labetalol, 10 mg, Q15 Min PRN  lidocaine 2% jelly, , Once PRN  naloxone, 0.2 mg, PRN  ondansetron, 4 mg, Q6H PRN  oxyCODONE, 2.5 mg, Q4H PRN  phenol, 1 spray, Q2H PRN              Labs:     Recent Labs   Lab 08/03/22  0637 08/02/22  0527 08/01/22  0320   WBC 13.17* 13.08* 11.72*   Hemoglobin 8.8* 8.8* 9.4*   Hematocrit 27.6* 27.1* 28.0*   Platelet Count 266 270 300     Recent Labs   Lab 08/03/22  0637 08/02/22  0527 08/01/22  0320   Sodium 140 139 140   Potassium 4.9 4.5 4.3   Chloride 102 101 103   CO2 22 23 25    BUN 45* 34* 23*   Creatinine 5.8* 4.6* 3.2*   Calcium 8.8 8.5 8.4   Albumin 1.6* 1.6* 1.7*   Phosphorus 4.7 3.7 3.1   Magnesium 1.9 1.9 2.0   Glucose 82 99 94   GFR 6.7* 8.8* 13.7*           Signed by: Delice Bison, MD

## 2022-08-03 NOTE — Progress Notes (Signed)
UF of 2.4 liters. Tolerated well. Remains restless and confused on and off. Lungs diminished, sat 98% on RA. Dressing to right permcath dry and intact. Continues with lower extremity non pitting edema. Continues with wound vac to abdomen. Telemetry  SR. Report called to Barbee Shropshire RN   08/03/22 1308   Treatment Summary   Time Off Machine 1315   Duration of Treatment (Hours) 3.5   Treatment Type 2:1   Dialyzer Clearance Moderately streaked   Fluid Volume Off (mL) 2842   Prime Volume (mL) 200   Rinseback Volume (mL) 200   Fluid Given: Normal Saline (mL) 0   Fluid Given: PRBC  0 mL   Fluid Given: Albumin (mL) 0   Fluid Given: Other (mL) 0   Total Fluid Given 400   Hemodialysis Net Fluid Removed 2442   Post Treatment Assessment   Post-Treatment Weight (Kg) 83.7   Patient Response to Treatment TOLERATED FAIR. RESTLESS   Permacath Catheter - Tunneled 07/14/22 Subclavian vein catheter Right   Placement Date/Time: 07/14/22 1404   Inserted by: Dr. Welton Flakes  Access Type: Subclavian vein catheter  Orientation: Right  Central Line Infection Prevention Education provided?: Yes  Hand Hygiene: Alcohol based hand scrub;Soap and water  Line cart u...   Line necessity reviewed? Apheresis/hemodialysis   Site Assessment Clean;Dry;Intact   Catheter Lumen Volume Venous 1.6 mL   Catheter Lumen Volume Arterial 1.6 mL   Dressing Status and Intervention Dressing Intact   Tego/Curos Caps on Catheter Yes   NEW Tego/Curos Caps placed (Date) 08/03/22   End Caps Free From Blood Yes   Line Care Connections checked and tightened   Dressing Type Transparent;Biopatch   Dressing Status Clean;Dry;Intact   Dressing/Line Intervention Caps changed   Vitals   Temp 98 F (36.7 C)   Heart Rate 87   Resp Rate 16   BP 117/59   SpO2 98 %   O2 Device None (Room air)   Assessment   Mental Status Alert;Restless;Disoriented   Cardiac (WDL) WDL   Cardiac Regularity Return to [facility]   Cardiac Symptoms Return to [facility]   Cardiac Rhythm Normal sinus rhythm   Respiratory   WDL   Respiratory Pattern Return to WDL   Bilateral Breath Sounds Diminished   R Breath Sounds Diminished   L Breath Sounds Diminished   Cough Weak   Edema  X   Generalized Edema Non Pitting Edema   RLE Edema Non Pitting Edema   LLE Edema Non Pitting Edema   General Skin Color Appropriate for ethnicity   Skin Condition/Temp Return to WDL   Gastrointestinal (WDL) X   Abdomen Inspection Open abdomen  (WOUND VAC TO ABDOMINAL OPEN WOUND)   GI Conditions Return to [facility]   Mobility Bed   Pain Assessment   Charting Type Reassessment   Pain Scale Used Wong-Baker FACES   Numeric Pain Scale   Pain Score 0   POSS Score 2   Education   Person taught Patient   Knowledge basis Minimal   Topics taught Procedure   Teaching Tools Explain   Reponse No Evidence of Learning;Needs Reinforcement   Bedside Nurse Communication   Name of bedside RN - post dialysis Barbee Shropshire RN

## 2022-08-03 NOTE — SLP Progress Note (Signed)
Speech Language Pathology    [facility]  Speech Therapy Treatment Note Attempt     Patient:  Shelby Barron MRN#:  [id number]  Unit:  [facility] [address] [facility] Room/Bed:  I4332/R5188.A    Attempted to see patient at approximately 11:25 am.  Pt is in dialysis at this time. RN is unsure of pt's return time. Will follow-up and see pt as schedule permits.       08/03/2022  Hattie Perch M.S., CCC-SLP  Spectra Link # [phone]  Department Number: [phone]

## 2022-08-03 NOTE — Progress Notes (Signed)
Arrived via bed. Pt. Awake. Disoriented. Telemetry  SR. SAT 99% ON RA. Lungs diminished. Dressing to right permcath dry and intact. Pt. With generalized non pitting edema. Wound vac to abdomen noted. Moderate amount of bloody drainage noted. Resistence noted in venous port. Report received from  Candice Camp RN   08/03/22 0933   Dialysis Weight   Pre-Treatment Weight (Kg) 90.1   Scale Type ICU Bed Scale   Vitals   Temp 98.4 F (36.9 C)   Heart Rate 78   Resp Rate 16   BP 173/70   O2 Device None (Room air)   Assessment   Mental Status Alert;Oriented   Cardiac (WDL) WDL   Cardiac Regularity Return to [facility]   Cardiac Symptoms Return to [facility]   Cardiac Rhythm Normal sinus rhythm   Respiratory  WDL   Respiratory Pattern Return to WDL   Bilateral Breath Sounds Diminished   R Breath Sounds Diminished   L Breath Sounds Diminished   Edema  X   Generalized Edema Non Pitting Edema   RLE Edema Non Pitting Edema   LLE Edema Non Pitting Edema   General Skin Color Appropriate for ethnicity   Skin Condition/Temp Return to WDL   Gastrointestinal (WDL) X   GI Conditions Return to [facility]   Mobility Bed   Permacath Catheter - Tunneled 07/14/22 Subclavian vein catheter Right   Placement Date/Time: 07/14/22 1404   Inserted by: Dr. Welton Flakes  Access Type: Subclavian vein catheter  Orientation: Right  Central Line Infection Prevention Education provided?: Yes  Hand Hygiene: Alcohol based hand scrub;Soap and water  Line cart u...   Line necessity reviewed? Apheresis/hemodialysis   Site Assessment Clean;Dry   Catheter Lumen Volume Venous 1.6 mL   Catheter Lumen Volume Arterial 1.6 mL   Dressing Status and Intervention Dressing Intact   Pain Assessment   Charting Type Assessment   Pain Scale Used Wong-Baker FACES   Numeric Pain Scale   Pain Score 0   POSS Score 2   Hemodialysis Comments   Pre-Hemodialysis Comments time out done

## 2022-08-04 LAB — WHOLE BLOOD GLUCOSE POCT
Whole Blood Glucose POCT: 102 mg/dL — ABNORMAL HIGH (ref 70–100)
Whole Blood Glucose POCT: 164 mg/dL — ABNORMAL HIGH (ref 70–100)
Whole Blood Glucose POCT: 115 mg/dL — ABNORMAL HIGH (ref 70–100)
Whole Blood Glucose POCT: 125 mg/dL — ABNORMAL HIGH (ref 70–100)

## 2022-08-04 LAB — COMPREHENSIVE METABOLIC PANEL
ALT: 9 U/L (ref 0–55)
AST (SGOT): 13 U/L (ref 5–41)
Albumin/Globulin Ratio: 0.6 — ABNORMAL LOW (ref 0.9–2.2)
Albumin: 1.6 g/dL — ABNORMAL LOW (ref 3.5–5.0)
Alkaline Phosphatase: 94 U/L (ref 37–117)
Anion Gap: 13 (ref 5.0–15.0)
BUN: 25 mg/dL — ABNORMAL HIGH (ref 7–21)
Bilirubin, Total: 0.2 mg/dL (ref 0.2–1.2)
CO2: 25 mEq/L (ref 17–29)
Calcium: 8.4 mg/dL (ref 7.9–10.2)
Chloride: 103 mEq/L (ref 99–111)
Creatinine: 3.8 mg/dL — ABNORMAL HIGH (ref 0.4–1.0)
GFR: 11.1 mL/min/{1.73_m2} — ABNORMAL LOW (ref >=60.0)
Globulin: 2.8 g/dL (ref 2.0–3.6)
Glucose: 94 mg/dL (ref 70–100)
Potassium: 4.2 mEq/L (ref 3.5–5.3)
Protein, Total: 4.4 g/dL — ABNORMAL LOW (ref 6.0–8.3)
Sodium: 141 mEq/L (ref 135–145)

## 2022-08-04 LAB — CROSSMATCH PRBC, 1 UNIT
Expiration Date: 202408132359
Product Status: TRANSFUSED
Unit Blood Type: O POS

## 2022-08-04 LAB — CBC
Absolute nRBC: 0 10*3/uL (ref <=0.00)
Hematocrit: 26.7 % — ABNORMAL LOW (ref 34.7–43.7)
Hemoglobin: 8.7 g/dL — ABNORMAL LOW (ref 11.4–14.8)
MCH: 29.7 pg (ref 25.1–33.5)
MCHC: 32.6 g/dL (ref 31.5–35.8)
MCV: 91.1 fL (ref 78.0–96.0)
MPV: 9.6 fL (ref 8.9–12.5)
Platelet Count: 263 10*3/uL (ref 142–346)
RBC: 2.93 10*6/uL — ABNORMAL LOW (ref 3.90–5.10)
RDW: 15 % (ref 11–15)
WBC: 11.91 10*3/uL — ABNORMAL HIGH (ref 3.10–9.50)
nRBC %: 0 /100 WBC (ref <=0.0)

## 2022-08-04 LAB — MAGNESIUM: Magnesium: 1.8 mg/dL (ref 1.6–2.6)

## 2022-08-04 LAB — PHOSPHORUS: Phosphorus: 3.6 mg/dL (ref 2.3–4.7)

## 2022-08-04 NOTE — OT Progress Note (Signed)
Occupational Therapy Treatment  Shelby Barron        Post Acute Care Therapy Recommendations   Discharge Recommendations:  SNF    DME needs IF patient is discharging home: Hospital bed, [facility], [facility]    Therapy discharge recommendations may change with patient status.  Please refer to most recent note for up-to-date recommendations.    Unit: [facility]  Bed: [phone].[id number]    ___________________________________________________    Time of treatment:  OT Received On: 08/04/22  Start Time: 1455  Stop Time: 1523  Time Calculation (min): 28 min       Chart Review and Collaboration with Care Team: 7 minutes, not included in above time.    OT Visit Number: 6      Precautions and Contraindications:    Precautions  Weight Bearing Status: no restrictions  Fall Risks: High;Impaired balance/gait;Impaired mobility;Muscle weakness  Other Precautions: L hemiplegia, wound vac on abdomen, sacral wounds    Personal Protective Equipment (PPE)  gloves    Updated Labs:  Lab Results   Component Value Date/Time    HGB 8.7 (L) 08/04/2022 04:04 AM    HGB 8.6 (L) 06/03/2022 04:23 AM    HCT 26.7 (L) 08/04/2022 04:04 AM    HCT 26.8 (L) 06/03/2022 04:23 AM    K 4.2 08/04/2022 04:04 AM    K 3.6 06/03/2022 04:23 AM    NA 141 08/04/2022 04:04 AM    NA 137 06/03/2022 04:23 AM    INR 1.1 07/14/2022 04:35 AM    INR 1.1 06/01/2022 03:15 AM    TROPI 9.6 05/26/2022 06:37 PM    TROPI -2.2 05/26/2022 06:37 PM    TROPI 11.8 05/26/2022 03:26 PM       All imaging reviewed, please see chart for details.    Subjective:    .  "Yes" "Willis"       Patient's medical condition is appropriate for Occupational Therapy intervention at this time.  Patient is agreeable to participation in the therapy session. Nursing clears patient for therapy.  Pain Assessment  Pain Assessment: Numeric Scale (0-10)  Pain Score:  (Pt unable to quantify, but said yes when asked if she had pain)  Pain Location: Abdomen  Pain Orientation:  Anterior  Pain Intervention(s): Repositioned;Therapeutic presence      Objective:  Observation of Patient/Vital Signs:     Stable    Cognition/Neuro Status  Arousal/Alertness: Delayed responses to stimuli  Attention Span: Difficulty attending to directions  Orientation Level: Oriented to person;Disoriented to place;Disoriented to time;Disoriented to situation  Memory: Decreased short term memory;Decreased recall of recent events  Following Commands: Follows one step commands with repetition;Follows one step commands with increased time  Safety Awareness: maximal verbal instruction  Behavior: calm;flat affect (lethargic)  Motor Planning: decreased initiation;decreased processing speed  Coordination: FMC impaired;GMC impaired (LUE/LLE)    Functional Mobility  Rolling: Maximal Assist;to left;to right (x2)    Self-care and Home Management  Grooming: Maximal Assist;wash/dry face;in bed  LB Dressing: Dependent;Don/doff R sock;Don/doff L sock  Toileting: Dependent;perineal hygiene    Therapeutic Exercises (Bilateral)  Elbow PROM: Flexion;Extension Pronation/Supination (x 5 reps)  Wrist PROM: Flexion/Extension (x 5 reps)  Hand PROM: MP flexion;thumb flexion (x 5 reps)  Hip PROM: flexion/extension/abduction/adduction, plantarflexion/dorsiflexion x 5 reps       Educated the Patient and Family to role of occupational therapy, plan of care, goals of therapy and safety with mobility and ADLs with limited indication of understanding.  Additional education required.Marland Kitchen  Patient left in bed with alarm and all other medical equipment in place and call bell and all personal items/needs within reach.  RN notified of session outcome.  CM team and attending have been previously notified of d/c recommendation; no updates to d/c recommendation since that time.      Assessment:  Slow progress toward OT goals. Pt minimally able to follow commands. Poor initiation noted during functional activities. Session mainly focused on UE/LE PROM.  Attempted to have Pt wash her face, however no initiation of task. Therapist provided hand over hand assist, however Pt resisting at times. Therapist facilitated rolling with clin tech assisting in pericare. Dtr present at beginning of session. At this time, SNF remains d/c rec.             PMP Activity: Step 3 - Bed Mobility  Distance Walked (ft) (Step 6,7): 0 Feet      Plan:  OT Frequency Recommended: 2-3x/wk  Goal Formulation: Patient      Time For Goal Achievement: by time of discharge  ADL Goals  Patient will groom self: Moderate Assist, Not met     Neuro Re-Ed Goals  Pt will sit at edge of bed: Maximal Assist, for 3 minutes, Partly met  Other Goal: Pt will follow one step v commands 50% of the time  Musculoskeletal Goals  Pt will perform Home Exercise Program: maximal assist, with caregiver/family assist, Goal met  Executive Fucntion Goals  Pt will demonstrate attention to task: with minimal assist, to increase ability to complete ADLs, Not met  Other Goal: Pt will be oriented X 3 75% of the time; not met              Continue plan of care.      Beverly Gust, OTR/L    Physical Medicine and Rehabilitation  [facility]  [phone]    08/04/2022  3:38 PM    [facility]  Patient: Shelby Barron MRN#: [id number]   Unit: [facility] [address] [facility] Bed: [phone]

## 2022-08-04 NOTE — SLP Progress Note (Signed)
Speech Language Pathology    [facility]  Speech Therapy Treatment Note    Patient:  Shelby Barron MRN#:  [id number]  Unit:  [facility] [address] [facility] Room/Bed:  V4098/J1914.A      Time of treatment:   SLP Received On: 08/04/22  Start Time: 1020  Stop Time: 1040  Time Calculation (min): 20 min       Personal Protective Equipment (PPE)  Gloves    Subjective: Pt seen upright in bed. She was alert, awake, and able to follow basic 1-step directions. Pt aphasic and speaking with language of confusion.           Objective: Oropharyngeal analysis during PO trials in order to determine LRD,development and training of swallow strategies in order to improve swallow function and decrease risk of penetration/aspiration;  therapeutic feedings by SLP  and safe swallowing precautions education.       Assessment: Pt consumed 8 tsp of puree, 2 fl oz of slightly thickened liquids (shake without added thickener) and 3.5 fl oz of nectar thickened liquids via straw. Some oral and limb apraxia noted, corrected by clinician models of sequencing labial seal around straw + suck. She was externally paced, single sips at a time. She presented with moderate oropharyngeal dysphagia c/b min lingual hold + increased AP transit times and suspect mild swallow delay with premature spillage. Improved with isolated sips at a time. She had mildly reduced laryngeal elevation per digital palpation with no overt s/s of aspiration/penetration.        Plan/Recommendations:  IDDSI Pureed - PU4  and Mildly thick  MT2  Slow feeding rate, small bites / sips , and upright position during and after the meals . Pt may have shakes without thickener.        Charges Minutes   SLP treat    Swallow treat 20       08/04/2022  Hattie Perch M.S., CCC-SLP  Spectra Link # [phone]  Department Number: [phone]

## 2022-08-04 NOTE — Progress Notes (Signed)
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 s/p bilateral radical nephrectomies and  cystectomy on 06/28/22  - complicated by small bowel enterotomy and blood loss   New onset afib - on amiodarone and anticoagulation  High grade urothelial bladder CA: s/p bilateral nephrectomies and cystectomy c/b mall bowel enterotomy and blood loss (~1L) on 06/28/22  - awaiting wound debridement   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 - remains low, adequate iron - on aranesp  Encephalopathy - better  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   HTN - BP better     Recommendations:   HD tomorrow   Plan for SNF on d/c  No need for daily labs from renal perspective - ok to do labs on HD days     Case discussed with: patient    Delice Bison, MD  [facility]  [phone] (office)  X [phone] (FF[facility] Spectra-Link)      Subjective: resting     Review of Systems:   No SOB    Physical Exam:     Vitals:    08/04/22 0300 08/04/22 0409 08/04/22 0553 08/04/22 0744   BP:  170/66 159/71 167/73   Pulse:  75 78 78   Resp:  20  20   Temp:  98.2 F (36.8 C)  99 F (37.2 C)   TempSrc:  Oral  Oral   SpO2:  97%  98%   Weight: 89.4 kg (197 lb 1.5 oz)      Height:           Intake and Output Summary (Last 24 hours) at Date Time    Intake/Output Summary (Last 24 hours) at 08/04/2022 0915  Last data filed at 08/03/2022 1612  Gross per 24 hour   Intake 100 ml   Output 2442 ml   Net -2342 ml         General: resting   Cardiovascular: regular rate and rhythm  Lungs: bilateral  air entry  Abdomen: soft  Extremities: no edema  Access: R permacath     Meds:      Scheduled Meds: PRN Meds:    acetaminophen, 650 mg, Oral, 4 times per day   Or  acetaminophen, 650 mg, Oral, 4 times per day  amiodarone, 200 mg, per NG tube, Daily  apixaban, 2.5 mg, Oral, Q12H SCH  atorvastatin, 40 mg, Oral, QHS  carvedilol, 12.5 mg, Oral, Q12H SCH  darbepoetin alfa, 0.45 mcg/kg, Subcutaneous, Weekly  glycopyrrolate, 0.2 mg, Intravenous, Q8H  insulin glargine, 5 Units, Subcutaneous, QAM  insulin lispro, 1-3 Units, Subcutaneous, QHS  insulin lispro, 1-5 Units, Subcutaneous, TID AC  lidocaine, 20 mL, Intradermal, Once  pantoprazole, 40 mg, per NG tube, Q12H SCH  thiamine, 100 mg, per NG tube, Daily  umeclidinium-vilanterol, 1 puff, Inhalation, QAM  vitamins/minerals, 1 tablet, Oral, Daily          Continuous Infusions:         sodium chloride, , PRN  acetaminophen, 650 mg, Q6H PRN  albuterol-ipratropium, 3 mL, Q6H PRN  benzocaine, 1 spray, TID PRN  dextrose, 15 g of glucose, PRN   Or  dextrose, 12.5 g, PRN   Or  dextrose, 12.5 g, PRN   Or  glucagon (rDNA), 1 mg, PRN  diphenhydrAMINE, 25 mg, Q6H PRN  hydrALAZINE, 10 mg, Q3H PRN  HYDROmorphone, 1 mg, Q4H PRN  labetalol, 10 mg, Q15 Min PRN  lidocaine 2% jelly, , Once PRN  naloxone, 0.2 mg, PRN  ondansetron, 4 mg, Q6H PRN  oxyCODONE, 2.5 mg, Q4H PRN  phenol, 1 spray, Q2H PRN              Labs:     Recent Labs   Lab 08/04/22  0404 08/03/22  0637 08/02/22  0527   WBC 11.91* 13.17* 13.08*   Hemoglobin 8.7* 8.8* 8.8*   Hematocrit 26.7* 27.6* 27.1*   Platelet Count 263 266 270     Recent Labs   Lab 08/04/22  0404 08/03/22  0637 08/02/22  0527   Sodium 141 140 139   Potassium 4.2 4.9 4.5   Chloride 103 102 101   CO2 25 22 23    BUN 25* 45* 34*   Creatinine 3.8* 5.8* 4.6*   Calcium 8.4 8.8 8.5   Albumin 1.6* 1.6* 1.6*   Phosphorus 3.6 4.7 3.7   Magnesium 1.8 1.9 1.9   Glucose 94 82 99   GFR 11.1* 6.7* 8.8*           Signed by: Delice Bison, MD

## 2022-08-04 NOTE — Plan of Care (Signed)
NURSING SHIFT NOTE     Patient: Shelby Barron  Day: 33      SHIFT EVENTS     Shift Narrative/Significant Events (PRN med administration, fall, RRT, etc.):   Pt A&O to self only  Vital signs stable  Pt on room air  No reports of pain  Scheduled meds given  Chg bath provided   Midline flushing well  Safety and fall precautions remain in place. Purposeful rounding completed.          ASSESSMENT     Changes in assessment from patient's baseline this shift:    Neuro: No  CV: No  Pulm: No  Peripheral Vascular: No  HEENT: No  GI: No  BM during shift: No, Last BM: Last BM Date: 08/03/22  GU: No   Integ: No  MS: No    Pain: None  Pain Interventions:   Medications Utilized:     Mobility: PMP Activity: Step 3 - Bed Mobility of Distance Walked (ft) (Step 6,7): 0 Feet           Lines     Patient Lines/Drains/Airways Status       Active Lines, Drains and Airways       Name Placement date Placement time Site Days    Permacath Catheter - Tunneled 07/14/22 Subclavian vein catheter Right 07/14/22  1404  -- 20    Midline IV 07/01/22 Anterior;Left Upper Arm 07/01/22  1315  Upper Arm  33                         VITAL SIGNS     Vitals:    08/04/22 0027   BP: 156/76   Pulse: 69   Resp:    Temp:    SpO2: 99%       Temp  Min: 98 F (36.7 C)  Max: 99 F (37.2 C)  Pulse  Min: 68  Max: 90  Resp  Min: 16  Max: 17  BP  Min: 105/57  Max: 173/70  SpO2  Min: 95 %  Max: 99 %      Intake/Output Summary (Last 24 hours) at 08/04/2022 0234  Last data filed at 08/03/2022 1612  Gross per 24 hour   Intake 200 ml   Output 2442 ml   Net -2242 ml            CARE PLAN       Problem: Moderate/High Fall Risk Score >5  Goal: Patient will remain free of falls  Flowsheets (Taken 08/03/2022 0721 by Candice Camp, RN)  High (Greater than 13):   HIGH-Consider use of low bed   HIGH-Initiate use of floor mats as appropriate   HIGH-Apply yellow "Fall Risk" arm band   HIGH-Utilize chair pad alarm for patient while in the chair   HIGH-Bed alarm on at all times while  patient in bed   HIGH-Visual cue at entrance to patient's room   MOD-Place Fall Risk level on whiteboard in room   MOD-Request PT/OT consult order for patients with gait/mobility impairment   MOD-Include family in multidisciplinary POC discussions   MOD-Use of assistive devices -Bedside Commode if appropriate   MOD-Remain with patient during toileting   MOD-Consider a move closer to Nurses Station   MOD-Use of chair-pad alarm when appropriate     Problem: Inadequate Airway Clearance  Goal: Patent Airway maintained  Flowsheets (Taken 08/04/2022 0234)  Patent airway maintained:   Provide adequate fluid intake to liquefy secretions  Suction secretions as needed   Reinforce use of ordered respiratory interventions (i.e. CPAP, BiPAP, Incentive Spirometer, Acapella, etc.)   Reposition patient every 2 hours and as needed unless able to self-reposition     Problem: Infection Prevention  Goal: Free from infection  Flowsheets (Taken 08/04/2022 0234)  Free from infection:   Monitor/assess vital signs   Monitor/assess output from surgical drain if present   Assess incision for evidence of healing   Encourage/assist patient to turn, cough and perform deep breathing every 2 hours   Assess for signs and symptoms of infection   Monitor/assess lab values and report abnormal values     Problem: Constipation  Goal: Fluid and electrolyte balance are achieved/maintained  Flowsheets (Taken 08/04/2022 0234)  Fluid and electrolyte balance are achieved/maintained:   Monitor/assess lab values and report abnormal values   Assess and reassess fluid and electrolyte status   Monitor for muscle weakness   Observe for cardiac arrhythmias     Problem: Safety  Goal: Patient will be free from injury during hospitalization  Flowsheets (Taken 08/04/2022 0234)  Patient will be free from injury during hospitalization:   Assess patient's risk for falls and implement fall prevention plan of care per policy   Use appropriate transfer methods   Provide and  maintain safe environment   Ensure appropriate safety devices are available at the bedside   Hourly rounding   Include patient/ family/ care giver in decisions related to safety   Assess for patients risk for elopement and implement Elopement Risk Plan per policy     Problem: Fluid and Electrolyte Imbalance/ Endocrine  Goal: Fluid and electrolyte balance are achieved/maintained  Flowsheets (Taken 08/04/2022 0234)  Fluid and electrolyte balance are achieved/maintained:   Monitor/assess lab values and report abnormal values   Assess and reassess fluid and electrolyte status   Monitor for muscle weakness   Observe for cardiac arrhythmias     Problem: Inadequate Gas Exchange  Goal: Patent Airway maintained  Flowsheets (Taken 08/04/2022 0234)  Patent airway maintained:   Provide adequate fluid intake to liquefy secretions   Suction secretions as needed   Reinforce use of ordered respiratory interventions (i.e. CPAP, BiPAP, Incentive Spirometer, Acapella, etc.)   Reposition patient every 2 hours and as needed unless able to self-reposition

## 2022-08-04 NOTE — Progress Notes (Signed)
08/04/22 1300   Volunteer Chaplain   Visit Type Follow-up   Source Chaplain Initiated   Present at Visit Patient   Spiritual Care Provided to patient   Reason for Visit Spiritual Support   Spiritual Care Interventions Provided silent and supportive presence   Spiritual Care Outcomes Patient expressed appreciation of visit   Length of Visit 0-15 minutes   Follow-up No follow-up at this time

## 2022-08-04 NOTE — Progress Notes (Signed)
Infectious Diseases & Tropical Medicine  Progress Note    08/04/2022   Shelby Barron GNF:[id number],[id number] is a 82 y.o. female,       Assessment:     Encephalopathy  Acute CVA  History of bladder cancer and renal failure  Status post radical cystectomy/bilateral nephrectomy, enterectomy and lysis of adhesion (06/28/2022)  S/p small bowel resection  Abdominal wall wound s/p bedside debridement/wound VAC placement (08/02/2022)  End-stage renal disease on hemodialysis  Atrial fibrillation  Risk of aspiration  Leukocytosis improving    Plan:     Continue monitoring without antibiotics  Continue local wound care  Follow-up chest x-ray  Continue probiotics  Monitor electrolytes and renal functions closely  Monitor clinically    ROS:     General: No fever, awake and alert, confused  Respiratory: No cough or shortness of breath  Gastrointestinal: No abdominal pain  Genito-Urinary: no hematuria  Musculoskeletal: no edema  Neurological: awake  Dermatological: No rash    Physical Examination:     Blood pressure 167/73, pulse 78, temperature 99 F (37.2 C), temperature source Oral, resp. rate 20, height 1.575 m (5\' 2" ), weight 89.4 kg (197 lb 1.5 oz), SpO2 98%.    General Appearance: Comfortable  HEENT: Pupils are equal, round, and reactive to light.   Lungs: Clear to auscultation  Heart: Regular rate and rhythm  Chest: Symmetric chest wall expansion.   Abdomen: soft, no hepatosplenomegaly, no bowel sounds, abdominal wall wound VAC in place  Neurological: awake, confused  Extremities: No edema    Laboratory And Diagnostic Studies:     Recent Labs     08/04/22  0404 08/03/22  0637   WBC 11.91* 13.17*   Hemoglobin 8.7* 8.8*   Hematocrit 26.7* 27.6*   Platelet Count 263 266     Recent Labs     08/04/22  0404 08/03/22  0637   Sodium 141 140   Potassium 4.2 4.9   Chloride 103 102   CO2 25 22   BUN 25* 45*   Creatinine 3.8* 5.8*   Glucose 94 82   Calcium 8.4 8.8     Recent Labs     08/04/22  0404 08/03/22  0637   AST (SGOT) 13  12   ALT 9 9   Alkaline Phosphatase 94 94   Protein, Total 4.4* 4.5*   Albumin 1.6* 1.6*         Current Meds:      Scheduled Meds: PRN Meds:    acetaminophen, 650 mg, Oral, 4 times per day   Or  acetaminophen, 650 mg, Oral, 4 times per day  amiodarone, 200 mg, per NG tube, Daily  apixaban, 2.5 mg, Oral, Q12H SCH  atorvastatin, 40 mg, Oral, QHS  carvedilol, 12.5 mg, Oral, Q12H SCH  darbepoetin alfa, 0.45 mcg/kg, Subcutaneous, Weekly  glycopyrrolate, 0.2 mg, Intravenous, Q8H  insulin glargine, 5 Units, Subcutaneous, QAM  insulin lispro, 1-3 Units, Subcutaneous, QHS  insulin lispro, 1-5 Units, Subcutaneous, TID AC  lidocaine, 20 mL, Intradermal, Once  pantoprazole, 40 mg, per NG tube, Q12H SCH  thiamine, 100 mg, per NG tube, Daily  umeclidinium-vilanterol, 1 puff, Inhalation, QAM  vitamins/minerals, 1 tablet, Oral, Daily        Continuous Infusions:       sodium chloride, , PRN  acetaminophen, 650 mg, Q6H PRN  albuterol-ipratropium, 3 mL, Q6H PRN  benzocaine, 1 spray, TID PRN  dextrose, 15 g of glucose, PRN   Or  dextrose, 12.5 g, PRN  Or  dextrose, 12.5 g, PRN   Or  glucagon (rDNA), 1 mg, PRN  diphenhydrAMINE, 25 mg, Q6H PRN  hydrALAZINE, 10 mg, Q3H PRN  HYDROmorphone, 1 mg, Q4H PRN  labetalol, 10 mg, Q15 Min PRN  lidocaine 2% jelly, , Once PRN  naloxone, 0.2 mg, PRN  ondansetron, 4 mg, Q6H PRN  oxyCODONE, 2.5 mg, Q4H PRN  phenol, 1 spray, Q2H PRN          Shelby Barron A. Janalyn Rouse, M.D.  08/04/2022  10:12 AM

## 2022-08-04 NOTE — Progress Notes (Signed)
4 eyes in 4 hours pressure injury assessment note:      Completed with: Shelby Barron  Unit & Time admitted:              Bony Prominences: Check appropriate box; if wound is present enter wound assessment in LDA     Occiput:                 [x] WNL  []  Wound present  Face:                     [x] WNL  []  Wound present  Ears:                      [x] WNL  []  Wound present  Spine:                    [x] WNL  []  Wound present  Shoulders:             [x] WNL  []  Wound present  Elbows:                  [x] WNL  []  Wound present  Sacrum/coccyx:     [x] WNL  [x]  Wound present  Ischial Tuberosity:  [] WNL  [x]  Wound present  Trochanter/Hip:      [x] WNL  []  Wound present  Knees:                   [x] WNL  []  Wound present  Ankles:                   [x] WNL  []  Wound present  Heels:                    [x] WNL  []  Wound present  Other pressure areas:  []  Wound location       Device related: []  Device name:         LDA completed if wound present: yes  Consult WOCN if necessary    Other skin related issues, ie tears, rash, etc, document in Integumentary flowsheet

## 2022-08-04 NOTE — Plan of Care (Addendum)
NURSING SHIFT NOTE     Patient: Shelby Barron  Day: 16      SHIFT EVENTS     Shift Narrative/Significant Events (PRN med administration, fall, RRT, etc.):     Patient is alert and oriented x 1-2, scheduled meds given and well tolerated. On room air, no respiratory distress noted. Blood sugar checked, no insulin coverage given, parameter not met. Patient evaluated by PT, SNF recommended, was also evaluated by speech therapy. Patient assisted with meals. Incontinence care provided, skin care done. Wound vac on the stomach is intact and draining. Turned and repositioned Q2hrs. Will continue with the treament plan of care.     Safety and fall precautions remain in place. Purposeful rounding completed.          ASSESSMENT     Changes in assessment from patient's baseline this shift:    Neuro: No  CV: No  Pulm: No  Peripheral Vascular: No  HEENT: No  GI: No  BM during shift: No, Last BM: Last BM Date: 08/03/22  GU: No   Integ: No  MS: No    Pain:     Pain Interventions:   Medications Utilized:     Mobility: PMP Activity: Step 3 - Bed Mobility of Distance Walked (ft) (Step 6,7): 0 Feet           Lines     Patient Lines/Drains/Airways Status       Active Lines, Drains and Airways       Name Placement date Placement time Site Days    Permacath Catheter - Tunneled 07/14/22 Subclavian vein catheter Right 07/14/22  1404  -- 20    Midline IV 07/01/22 Anterior;Left Upper Arm 07/01/22  1315  Upper Arm  33                         VITAL SIGNS     Vitals:    08/04/22 0748   BP:    Pulse:    Resp:    Temp:    SpO2: 98%       Temp  Min: 98 F (36.7 C)  Max: 99 F (37.2 C)  Pulse  Min: 69  Max: 90  Resp  Min: 16  Max: 20  BP  Min: 105/57  Max: 170/66  SpO2  Min: 95 %  Max: 99 %             OXYGEN WEANING         CARE PLAN     Problem: Moderate/High Fall Risk Score >5  Goal: Patient will remain free of falls  Flowsheets (Taken 08/04/2022 1035)  High (Greater than 13):   HIGH-Consider use of low bed   HIGH-Initiate use of floor mats as  appropriate   HIGH-Pharmacy to initiate evaluation and intervention per protocol   HIGH-Apply yellow "Fall Risk" arm band   HIGH-Utilize chair pad alarm for patient while in the chair   HIGH-Bed alarm on at all times while patient in bed   HIGH-Visual cue at entrance to patient's room     Problem: Compromised Sensory Perception  Goal: Sensory Perception Interventions  Flowsheets (Taken 08/04/2022 1035)  Sensory Perception Interventions: Offload heels, Pad bony prominences, Reposition q 2hrs/turn Clock, Q2 hour skin assessment under devices if present     Problem: Compromised Moisture  Goal: Moisture level Interventions  Flowsheets (Taken 08/04/2022 1035)  Moisture level Interventions: Moisture wicking products, Moisture barrier cream     Problem:  Compromised Activity/Mobility  Goal: Activity/Mobility Interventions  Flowsheets (Taken 08/04/2022 1035)  Activity/Mobility Interventions: Pad bony prominences, TAP Seated positioning system when OOB, Promote PMP, Reposition q 2 hrs / turn clock, Offload heels     Problem: Inadequate Airway Clearance  Goal: Patent Airway maintained  Flowsheets (Taken 08/04/2022 1035)  Patent airway maintained:   Reposition patient every 2 hours and as needed unless able to self-reposition   Reinforce use of ordered respiratory interventions (i.e. CPAP, BiPAP, Incentive Spirometer, Acapella, etc.)   Suction secretions as needed   Provide adequate fluid intake to liquefy secretions   Position patient for maximum ventilatory efficiency     Problem: Impaired Mobility  Goal: Mobility/Activity is maintained at optimal level for patient  Flowsheets (Taken 08/04/2022 1035)  Mobility/activity is maintained at optimal level for patient:   Consult/collaborate with Physical Therapy and/or Occupational Therapy   Encourage independent activity per ability     Problem: Constipation  Goal: Fluid and electrolyte balance are achieved/maintained  Flowsheets (Taken 08/04/2022 1035)  Fluid and electrolyte balance are  achieved/maintained:   Observe for cardiac arrhythmias   Monitor for muscle weakness   Assess and reassess fluid and electrolyte status   Monitor/assess lab values and report abnormal values     Problem: Hemodynamic Status: Cardiac  Goal: Stable vital signs and fluid balance  Flowsheets (Taken 08/04/2022 1035)  Stable vital signs and fluid balance: Assess signs and symptoms associated with cardiac rhythm changes     Problem: Renal Instability  Goal: Fluid and electrolyte balance are achieved/maintained  Flowsheets (Taken 08/04/2022 1035)  Fluid and electrolyte balance are achieved/maintained:   Follow fluid restrictions/IV/PO parameters   Observe for cardiac arrhythmias   Assess and reassess fluid and electrolyte status   Monitor/assess lab values and report abnormal values   Monitor for muscle weakness     Problem: Fluid and Electrolyte Imbalance/ Endocrine  Goal: Fluid and electrolyte balance are achieved/maintained  Flowsheets (Taken 08/04/2022 1035)  Fluid and electrolyte balance are achieved/maintained:   Observe for cardiac arrhythmias   Monitor for muscle weakness   Assess and reassess fluid and electrolyte status   Monitor/assess lab values and report abnormal values     Problem: Diabetes: Glucose Imbalance  Goal: Blood glucose stable at established goal  Flowsheets (Taken 08/04/2022 1035)  Blood glucose stable at established goal:   Ensure appropriate consults are obtained (Nutrition, Diabetes Education, and Case Management)   Ensure appropriate diet and assess tolerance   Coordinate medication administration with meals, as indicated   Follow fluid restrictions/IV/PO parameters   Monitor intake and output.  Notify LIP if urine output is < 30 mL/hour.   Include patient/family in decisions related to nutrition/dietary selections   Monitor/assess vital signs   Ensure adequate hydration   Assess for hypoglycemia /hyperglycemia   Monitor lab values     Problem: Inadequate Gas Exchange  Goal: Patent Airway  maintained  Flowsheets (Taken 08/04/2022 1035)  Patent airway maintained:   Reposition patient every 2 hours and as needed unless able to self-reposition   Reinforce use of ordered respiratory interventions (i.e. CPAP, BiPAP, Incentive Spirometer, Acapella, etc.)   Suction secretions as needed   Provide adequate fluid intake to liquefy secretions   Position patient for maximum ventilatory efficiency     Problem: Every Day - Stroke  Goal: Mobility/Activity is maintained at optimal level for patient  Flowsheets (Taken 08/04/2022 1035)  Mobility/activity is maintained at optimal level for patient:   Consult/collaborate with Physical Therapy and/or Occupational Therapy  Encourage independent activity per ability  Goal: Neurovascular status is stable or improving  Flowsheets (Taken 08/04/2022 1035)  Neurovascular status is stable or improving:   Monitor/assess site of invasive procedure for signs of bleeding   Monitor/assess for signs of Venous Thrombus Emboli (edema of calf/thigh redness, pain)

## 2022-08-04 NOTE — Progress Notes (Signed)
[facility]  PROGRESS NOTE      Patient: Shelby Barron  Date: 08/04/2022   LOS: 37 Days  Admission Date: 06/28/2022   MRN: [id number]  Attending: Lennox Pippins, MD  When on service as the attending, please contact me on Epic Secure Chat from 7 AM - 7 PM for non-urgent issues. For urgent matters use XTend page from 7 AM - 7 PM.       ASSESSMENT/PLAN     Shelby Barron is a 82 y.o. female admitted with Bladder cancer      [name], [name], type 2 diabetes, renal cell carcinoma status post partial bilateral nephrectomy, [name], new diagnosis of urothelial cancer initally presented on 6/10 for elective cystectomy and completion nephrectomy, [name], [name], extubated and off pressors since 6/13.   She has been along hospital course, complicated by acute stroke and new onset A-fib.  Patient started on anticoagulation on 7/5    H/H drop but no overt bleeding on 7/7. Transfused 1U pRBCs. Also started on Aranesp. CTH showed few areas of cortical hyperdensity - petechial hemorrhage vs cortical laminar necrosis. OK to continue with eliquis per neuro given stable neuro exam.  Repeated CT head stable  She had NG tube and she passed SLP and NG tube was removed  Her wound has necrotic tissue and she had bedside debridement by general surgery on 08/02/2022  She got Dilaudid in the morning for debridement and in the afternoon patient was found to be less responsive stroke code was called but was canceled patient was given Narcan and with frequent painful stimuli patient will wake up and was more responsive and back to her baseline     Anticipated Discharge: Hopefully within 24-48 hours     Barriers to Discharge: Clinical improvement     Family/Social/Case Mgr Assistance: Patient daughter needs to choose SNF place  Toxic metabolic Encephalopathy/delirium resolving  - On and off confusion alert  oriented x 1-2, now relatively stable the last few days  - Patient was upgraded to ICU on 6/30 for lethargy and decreased responsiveness  - CT head was repeated on 6/29, no acute finding  - ABG consistent with respiratory alkalosis  - EEG consistent with encephalopathy no seizure, MRI no acute stroke  - CT abdomen with and without contrast after premedication did not show any leak or abscess  - SLP advanced diet. NG tube removed 7/9  - Palliative consulted:  Family is clear they would like no limits to care and patient should remain full code.     Anemia, 2/2 CKD and blood loss during OR  - TSAT 18%  - continue Aranesp per nephrology  - Trend CBC, transfuse Hgb<7. Currently stable.  - No active signs of bleeding    s/p open cystectomy, bilateral nephrectomies, and resection of small bowel after intra-op enterotomy    Mixed shock - hemorrhagic vs septic: Resolved  Necrotic wound tissue  - Status post wound debridement by general surgery at bedside on 7/15  - 6/10 small bowel enteretomy with 1L EBL. Went to the OR on June 12 for small bowel anastomosis, closure of mesenteric defect, closure of abdominal wall with wound VAC application. Wound VAC last changed on 7/15  - Surgery consulted, recommend CTAP with PO contrast that showed no anastomotic leak. Low concern for abscesses as they'd expect these  to have a more loculated appearance in the interim between 6/17 and 7/1 imaging  - ID consulted, completed meropenem and Mycamine for 2 weeks on 7/3. No further abx recommended after bedside debridement on 7/15    Acute CVA   Petechial hemorrhage vs cortical laminar necrosis  - Switched from daily ASA to Eliquis per neurology on 7/5 as > 10d post CVA  - Carotid Doppler showed plaque with no critical stenosis  - Head CT on 7/7 w/ a few areas of cortical hyperdensity - petechial hemorrhage vs cortical laminar necrosis. Discussed with neuro OK to continue eliquis for now. Repeat head CT on 7/8 stable.  - EEG encephalopathic  pattern no seizure, repeat MRI no new infarction  - PT OT recommend SNF     Acute hypoxemic respiratory failure resolved  COPD  Extubated on 6/13, currently on room air  X-ray repeated on 6/29 showed resolution of interstitial edema     ESRD  S/p CRRT this admission, now on HD TTS  Continue dialysis per nephrology  IR removed the nonfunctional permacath and right Qu[place] IJ cath and replaced with a new permacath on 6/26.     HTN  On coreg     New onset Afib with RVR, improved  - CHA2DS2-VASc score is 7  - Status post amiodarone drip. Now stable on oral amiodarone and coreg per cardiology recommendations  - GI and general surgery cleared for anticoagulation. Started on Eliquis since 07/23/22 with neurology clearance    Renal cell carcinoma outpatient follow-up hematology oncology  Urothelial carcinoma  Thrombocytopenia  - VTE ppx: SCDs, eliquis     Diabetes Mellitus type 2  -Sliding scale insulin               Nutrition: Pured diet as tolerated  Malnutrition Documentation    Moderate Malnutrition related to inadequate nutritional intake in the setting of acute illness as evidenced by <50% EER > 5 days, mild muscle losses (temporalis, deltoid) - new                 Patient has BMI=Body mass index is 36.05 kg/m.  Diagnosis: Obesity based on BMI criteria                         Recent Labs   Lab 08/04/22  0404 08/03/22  0637 08/02/22  0527   Hemoglobin 8.7* 8.8* 8.8*   Hematocrit 26.7* 27.6* 27.1*   MCV 91.1 91.4 90.6   WBC 11.91* 13.17* 13.08*   Platelet Count 263 266 270     Recent Labs   Lab 07/18/22  0421 06/02/22  0514   Ferritin 2,300.21*  --    Iron 18* 62   TIBC 99* 145*   Iron Saturation 18 43     Anemia Diagnosis: Already documented in note    Code Status: Full Code    Dispo: SNF    Family Contact: Daughter [name] at bedside    DVT Prophylaxis:   Current Facility-Administered Medications (Includes Only Anticoagulants, Misc. Hematological)   Medication Dose Route Last Admin    apixaban (ELIQUIS) tablet 2.5 mg   2.5 mg Oral 2.5 mg at 08/04/22 3500          CHART  REVIEW & DISCUSSION     The following chart items were reviewed as of 11:07 AM on 08/04/22:  [x]  Lab Results [x]  Imaging Results   [x]  Problem List  [x]  Current Orders [x]  Current Medications  []   Allergies  [x]  Code Status [x]  Previous Notes   []  SDoH    The management and plan of care for this patient was discussed with the following specialty consultants:  []  Cardiology  []  Gastroenterology                 [x]  Infectious Disease  []  Pulmonology []  Neurology                [x]  Nephrology  []  Neurosurgery []  Orthopedic Surgery  []  Heme/Onc  [x]  General Surgery []  Psychiatry                                   []  Palliative    SUBJECTIVE     [name] w  Seen in HD  Aox2, able to state her name & her dtrs also that she isin the hosptial  MEDICATIONS     Current Facility-Administered Medications   Medication Dose Route Frequency    acetaminophen  650 mg Oral 4 times per day    Or    acetaminophen  650 mg Oral 4 times per day    amiodarone  200 mg per NG tube Daily    apixaban  2.5 mg Oral Q12H SCH    atorvastatin  40 mg Oral QHS    carvedilol  12.5 mg Oral Q12H SCH    darbepoetin alfa  0.45 mcg/kg Subcutaneous Weekly    glycopyrrolate  0.2 mg Intravenous Q8H    insulin glargine  5 Units Subcutaneous QAM    insulin lispro  1-3 Units Subcutaneous QHS    insulin lispro  1-5 Units Subcutaneous TID AC    lidocaine  20 mL Intradermal Once    pantoprazole  40 mg per NG tube Q12H [facility]    thiamine  100 mg per NG tube Daily    umeclidinium-vilanterol  1 puff Inhalation QAM    vitamins/minerals  1 tablet Oral Daily       PHYSICAL EXAM     Vitals:    08/04/22 0748   BP:    Pulse:    Resp:    Temp:    SpO2: 98%       Temperature: Temp  Min: 98 F (36.7 C)  Max: 99 F (37.2 C)  Pulse: Pulse  Min: 69  Max: 90  Respiratory: Resp  Min: 16  Max: 20  Non-Invasive BP: BP  Min: 105/57  Max: 170/66  Pulse Oximetry SpO2  Min: 95 %  Max: 99 %    Intake and Output Summary (Last 24  hours) at Date Time    Intake/Output Summary (Last 24 hours) at 08/04/2022 1107  Last data filed at 08/04/2022 0853  Gross per 24 hour   Intake 100 ml   Output 2442 ml   Net -2342 ml         GEN APPEARANCE: Alert, awake and oriented x 1  HEENT: PERLA; EOMI; Conjunctiva Clear  NECK: Supple; No bruits  CVS: RRR, S1, S2; No M/G/R  LUNGS: CTAB; , no Rhonchi: No rales  ABD: Soft; No TTP; + Normoactive BS vertical abdominal wound and wound VAC in place  EXT: No edema; Pulses 2+ and intact  NEURO: moving right upper extremity spontaneously, able to move lower extremities for pain but minimal, no movement at all on the left upper extremity      LABS     Recent Labs   Lab  08/04/22  0404 08/03/22  0637 08/02/22  0527   WBC 11.91* 13.17* 13.08*   RBC 2.93* 3.02* 2.99*   Hemoglobin 8.7* 8.8* 8.8*   Hematocrit 26.7* 27.6* 27.1*   MCV 91.1 91.4 90.6   Platelet Count 263 266 270       Recent Labs   Lab 08/04/22  0404 08/03/22  0637 08/02/22  0527 08/01/22  0320 07/31/22  0449   Sodium 141 140 139 140 136   Potassium 4.2 4.9 4.5 4.3 4.7   Chloride 103 102 101 103 99   CO2 25 22 23 25 24    BUN 25* 45* 34* 23* 43*   Creatinine 3.8* 5.8* 4.6* 3.2* 4.5*   Glucose 94 82 99 94 82   Calcium 8.4 8.8 8.5 8.4 8.6   Magnesium 1.8 1.9 1.9 2.0 1.9       Recent Labs   Lab 08/04/22  0404 08/03/22  0637 08/02/22  0527   ALT 9 9 9    AST (SGOT) 13 12 12    Bilirubin, Total 0.2 0.3 0.2   Albumin 1.6* 1.6* 1.6*   Alkaline Phosphatase 94 94 93             Recent Labs   Lab 08/02/22  0527 08/01/22  1230 08/01/22  0408   PTT 83* 52* 48*       Microbiology Results (last 15 days)       ** No results found for the last 360 hours. **             RADIOLOGY     XR Chest AP Portable    Result Date: 08/02/2022   Hypoventilation with basilar atelectasis. Mild central vascular congestion. Charlott Rakes, MD 08/02/2022 3:23 PM    CT Head WO Contrast    Result Date: 07/26/2022   1.No significant change in multifocal areas of subacute infarction within bilateral cerebral  hemispheres. 2.Small areas of petechial hemorrhage in the left parietal and occipital lobes are unchanged. No new hemorrhage or measurable parenchymal hematoma. 3.Persistent complete opacification of the right mastoid air cells and middle ear cavity. Garnette Gunner, MD 07/26/2022 12:27 PM    CT Head WO Contrast    Result Date: 07/25/2022   1.Redemonstration of multifocal areas of bilateral acute infarctions. A few areas of cortical hyperdensity likely represent petechial hemorrhage. No mass occupying hematoma. No significant mass effect or midline shift. 2.Redemonstrated right mastoid and right middle ear cavity effusion with areas of bony thinning versus dehiscence of the right tegmen tympani and right tegmen mastoideum. Vara Guardian, MD 07/25/2022 2:19 PM    CT Abdomen Pelvis W IV And PO Cont    Result Date: 07/19/2022   1.Slight interval increase in left-sided retroperitoneal fluid collection with the other abdominal fluid collections appearing relatively stable. 2. There is no definite abscess or acute inflammatory process. 3. Incidental note is made of a small paraesophageal type hernia. 4. Sigmoid diverticulosis is noted without CT evidence of diverticulitis. 5. Other findings as noted above. Miguel Dibble, MD 07/19/2022 6:36 AM    MRI Brain WO Contrast    Result Date: 07/18/2022   Interval evolution of multifocal recent infarcts in the bilateral cerebral hemispheres. No new infarct. Milan Waunita Schooner, MD 07/18/2022 5:57 PM    XR Chest AP Portable    Result Date: 07/17/2022  1.Near-complete resolution of interstitial edema. 2.Improving pulmonary vascular congestion. Legrand Pitts, MD 07/17/2022 2:38 PM    CT Head WO Contrast    Result Date:  07/17/2022   1.No new acute intracranial pathology. 2.There are multiple regions of cytotoxic edema in the right > left frontal and left > right occipital lobes, consistent with known early subacute infarcts. Dannielle Burn 07/17/2022 8:26 AM    US Carotid Duplex Dopp Comp Bilateral    Result  Date: 07/15/2022   1. Plaque formation in the right internal carotid artery bulb with elevated velocities. Stenosis is estimated at 50-69%. 2. Plaque in the left internal carotid artery with normal velocities. Any stenosis is less than 50%. Criteria for stenosis and velocity parameters are based on the guidelines of the SRU (Society of Radiologists in Ultrasound). Grant et al: Carotid Artery Stenosis: Grayscale and Doppler Ultrasound Diagnosis-- Society of Radiologists in Ultrasound Consensus Conference Ultrasound Quarterly. Volume 19, Number 4; December, 2003. Recommendations updated October 2021 Verne Carrow is an [facility] accredited facility Suszanne Finch, MD 07/15/2022 9:41 AM    Tunneled Cath Removal (Permcath)    Result Date: 07/14/2022  1. Nonfunctioning tunneled dialysis catheter as described above. 2. Successful placement of a new 19 cm Equistream catheter as described above. Dimitrios Papadouris, MD 07/14/2022 4:06 PM    Tunneled Cath Placement (Permcath)    Result Date: 07/14/2022  1. Nonfunctioning tunneled dialysis catheter as described above. 2. Successful placement of a new 19 cm Equistream catheter as described above. Dimitrios Papadouris, MD 07/14/2022 4:06 PM    MRI Brain WO Contrast    Result Date: 07/13/2022   1.Patchy areas of acute/recent infarction involving the cerebral hemispheres bilaterally. The infarcts have a configuration suggesting watershed territory infarcts. 2.Chronic ischemic changes as discussed. Georgann Housekeeper, MD 07/13/2022 10:28 PM    CT Head WO Contrast    Result Date: 07/13/2022   1.New region of loss of gray-white matter differentiation in the right frontal lobe, concerning for acute infarction. 2.A few additional areas of poor gray-white matter differentiation for example in the left occipital lobe may be artifactual but could be correlated with MRI. 3.No evidence of acute intracranial hemorrhage or significant mass effect. These urgent results were discussed with and acknowledged by Dr.  Don Broach on 07/13/2022 3:20 PM. Vara Guardian, MD 07/13/2022 3:21 PM    XR Chest AP Portable    Result Date: 07/10/2022  Interstitial markings have slightly increased, suggesting minimal edema. No other change. Wilmon Pali, MD 07/10/2022 4:49 PM    XR Chest AP Portable    Result Date: 07/07/2022   There is mild interstitial prominence suggesting mild congestive heart failure/fluid overload in the setting of shortness of breath [name], MD 07/07/2022 12:32 PM    XR Chest AP Portable    Result Date: 07/06/2022   Hypoventilation with basilar atelectasis. Charlott Rakes, MD 07/06/2022 3:35 PM    CT Abdomen Pelvis W IV And PO Cont    Result Date: 07/06/2022  1.Postoperative changes from bilateral nephrectomy and cystectomy. 2.Small amount of ascites including some areas which appear loculated. Small collections of fluid and gas in the anterior midline abdomen may be postoperative but superimposed infection is not excluded. No rim-enhancing fluid collections or extravasation of oral contrast to suggest anastomotic leak. Demetrios Isaacs, MD 07/06/2022 5:51 AM    CT Head WO Contrast    Result Date: 07/06/2022   1.No acute intracranial abnormality is seen. Leandro Reasoner, MD 07/06/2022 3:56 AM    XR Abdomen AP    Result Date: 07/05/2022  Enteric tube terminates in the distal stomach. Elease Etienne, DO 07/05/2022 12:24 PM   Echo Results  None          Results for orders placed or performed during the hospital encounter of 06/28/22   MRI Brain WO Contrast    Narrative    HISTORY: Declining mental status, right gaze deviation.     COMPARISON: CT head 07/17/2022 and MRI brain 07/13/2022.     TECHNIQUE: MRI of the brain performed on a 1.5 Tesla scanner without  intravenous contrast.    CONTRAST: None.    FINDINGS:     Interval evolution of multifocal recent infarcts with associated edema in  the bilateral cerebral hemispheres. No new infarct. Mild chronic small  vessel ischemic changes within the cerebral white matter. Mild  generalized  cerebral volume loss.     No acute hemorrhage, extra-axial fluid collection, or midline shift. No  mass lesion or vasogenic edema.     Normal appearance of the sella. Cerebellar tonsils are appropriately  positioned. No acute hydrocephalus. Patent basal cisterns. Skull base flow  voids are intact.    No osseous abnormality. No acute orbital abnormality. Status post bilateral  lens replacement. No significant paranasal sinus mucosal disease. Severe  opacification of the right mastoid air cells and middle ear cavity. Mild  opacification of left mastoid air cells.       Impression         Interval evolution of multifocal recent infarcts in the bilateral cerebral  hemispheres. No new infarct.    Milan Waunita Schooner, MD  07/18/2022 5:57 PM   CT Head WO Contrast    Narrative    HISTORY: Petechial hemorrhage follow up.     COMPARISON: Multiple priors most recent CT 07/25/2022    TECHNIQUE: CT of the head performed without intravenous contrast. The  following dose reduction techniques were utilized: automated exposure  control and/or adjustment of the mA and/or KV according to patient size,  and the use of an iterative reconstruction technique.    CONTRAST: None.    FINDINGS:  Multifocal areas of subacute infarction within bilateral cerebral  hemispheres remain similar to prior. Small areas of petechial hemorrhage in  the left parietal and occipital lobes are unchanged. No new hemorrhage or  measurable parenchymal hematoma.    There is a background moderate chronic microangiopathic changes in the  periventricular white matter. Moderate generalized volume loss. No  extra-axial fluid collection or midline shift. Ventricles and basal  cisterns are patent.    Persistent complete opacification of the right mastoid air cells and middle  ear cavity. Paranasal sinuses are clear.      Impression       1.No significant change in multifocal areas of subacute infarction within  bilateral cerebral hemispheres.  2.Small  areas of petechial hemorrhage in the left parietal and occipital  lobes are unchanged. No new hemorrhage or measurable parenchymal hematoma.  3.Persistent complete opacification of the right mastoid air cells and  middle ear cavity.    Garnette Gunner, MD  07/26/2022 12:27 PM       Signed,  Lennox Pippins, MD  11:07 AM 08/04/2022

## 2022-08-05 LAB — WHOLE BLOOD GLUCOSE POCT
Whole Blood Glucose POCT: 94 mg/dL (ref 70–100)
Whole Blood Glucose POCT: 117 mg/dL — ABNORMAL HIGH (ref 70–100)
Whole Blood Glucose POCT: 99 mg/dL (ref 70–100)
Whole Blood Glucose POCT: 133 mg/dL — ABNORMAL HIGH (ref 70–100)

## 2022-08-05 MED ORDER — AMIODARONE HCL 200 MG PO TABS
200.0000 mg | ORAL_TABLET | Freq: Every day | ORAL | Status: DC
Start: 2022-08-05 — End: 2022-09-05

## 2022-08-05 MED ORDER — SODIUM CHLORIDE 0.9 % IV BOLUS
250.0000 mL | INTRAVENOUS | Status: AC | PRN
Start: 2022-08-05 — End: 2022-08-05

## 2022-08-05 MED ORDER — PANTOPRAZOLE ORAL SUSPENSION 40 MG/20 ML UD
40.0000 mg | Freq: Two times a day (BID) | ORAL | Status: DC
Start: 2022-08-05 — End: 2022-08-21

## 2022-08-05 MED ORDER — CARVEDILOL 12.5 MG PO TABS
12.5000 mg | ORAL_TABLET | Freq: Two times a day (BID) | ORAL | Status: DC
Start: 2022-08-05 — End: 2022-09-03

## 2022-08-05 MED ORDER — INSULIN GLARGINE 100 UNIT/ML SC SOLN
5.0000 [IU] | Freq: Every morning | SUBCUTANEOUS | Status: DC
Start: 2022-08-06 — End: 2022-08-05

## 2022-08-05 MED ORDER — APIXABAN 2.5 MG PO TABS
2.5000 mg | ORAL_TABLET | Freq: Two times a day (BID) | ORAL | Status: DC
Start: 2022-08-05 — End: 2022-09-03

## 2022-08-05 MED ORDER — OXYCODONE HCL 5 MG PO TABS
2.5000 mg | ORAL_TABLET | Freq: Three times a day (TID) | ORAL | 0 refills | Status: AC | PRN
Start: 2022-08-05 — End: 2022-08-10

## 2022-08-05 MED ORDER — SODIUM CHLORIDE 0.9 % IV BOLUS
100.0000 mL | INTRAVENOUS | Status: AC | PRN
Start: 2022-08-05 — End: 2022-08-05

## 2022-08-05 MED ORDER — NEPHRO-VITE 0.8 MG PO TABS
1.0000 | ORAL_TABLET | Freq: Every day | ORAL | Status: DC
Start: 2022-08-05 — End: 2022-08-18
  Administered 2022-08-05 – 2022-08-18 (×13): 1 via ORAL
  Filled 2022-08-05 (×14): qty 1

## 2022-08-05 NOTE — Plan of Care (Signed)
 Problem: Renal Instability  Goal: Fluid and electrolyte balance are achieved/maintained  Flowsheets (Taken 08/04/2022 1035 by Eston Ancil Doffing, RN)  Fluid and electrolyte balance are achieved/maintained:   Follow fluid restrictions/IV/PO parameters   Observe for cardiac arrhythmias   Assess and reassess fluid and electrolyte status   Monitor/assess lab values and report abnormal values   Monitor for muscle weakness     Problem: Patient Receiving Advanced Renal Therapies  Goal: Therapy access site remains intact  Flowsheets (Taken 07/29/2022 1100 by Sheela Connor, RN)  Therapy access site remains intact:   Assess therapy access site   Change therapy access site dressing as needed

## 2022-08-05 NOTE — Progress Notes (Signed)
 Arrived via bed. Pt. Awake, answers yes or no appropriatly. Calm. Lungs clear and diminished. Sat 98% on RA. Telemetry SR. Dressing to right permcath dry and intact. Wound vac to abdomen remains. No c/o pain. Report received from Ames Norse RN   08/05/22 9061   Dialysis Weight   Pre-Treatment Weight (Kg) 86.9   Scale Type ICU Bed Scale   Vitals   Temp 97.3 F (36.3 C)   Heart Rate 79   Resp Rate 16   BP 154/63   SpO2 97 %   O2 Device None (Room air)   Assessment   Mental Status Alert   Cardiac (WDL) WDL   Cardiac Regularity Return to [facility]   Cardiac Symptoms Return to [facility]   Cardiac Rhythm Normal sinus rhythm   Respiratory  WDL   Respiratory Pattern Return to WDL   Bilateral Breath Sounds Diminished;Clear   R Breath Sounds Diminished   L Breath Sounds Diminished   Edema  X   RLE Edema Non Pitting Edema   LLE Edema Non Pitting Edema   General Skin Color Appropriate for ethnicity   Skin Temp Warm   Gastrointestinal (WDL) WDL   Abdomen Inspection Return to [facility]   GI Conditions Return to [facility]   Mobility Bed   Permacath Catheter - Tunneled 07/14/22 Subclavian vein catheter Right   Placement Date/Time: 07/14/22 1404   Inserted by: Dr. Merleen  Access Type: Subclavian vein catheter  Orientation: Right  Central Line Infection Prevention Education provided?: Yes  Hand Hygiene: Alcohol based hand scrub;Soap and water   Line cart u...   Line necessity reviewed? Apheresis/hemodialysis   Site Assessment Clean;Dry;Intact   Catheter Lumen Volume Venous 1.6 mL   Catheter Lumen Volume Arterial 1.6 mL   Dressing Status and Intervention Dressing Intact   Pain Assessment   Charting Type Assessment   Pain Scale Used Wong-Baker FACES   Numeric Pain Scale   Pain Score 0   POSS Score 1   Hemodialysis Comments   Pre-Hemodialysis Comments time out done

## 2022-08-05 NOTE — PT Progress Note (Signed)
 Physical Therapy Treatment  Shelby Barron  Post Acute Care Therapy Recommendations   Discharge Recommendations:  SNF    DME needs IF patient is discharging home: No additional equipment/DME recommended at this time    Therapy discharge recommendations may change with patient status.  Please refer to most recent note for up-to-date recommendations.    Unit: [facility]  Bed: [phone].A    ___________________________________________________    Time of treatment:  PT Received On: 08/05/22  Start Time: 1740  Stop Time: 1810  Time Calculation (min): 30 min  Total Treatment Time (min): 15 (co tx with OT) to facilitate appropriate discharge planning    Chart Review and Collaboration with Care Team: 5 minutes, not included in above time.    PT Visit Number: 6    ___________________________________________________    Precautions:   Precautions  Weight Bearing Status: no restrictions  Aspiration Precautions: see SLP recommendations  Fall Risks: High, Impaired balance/gait, Impaired mobility, History of fall(s), Muscle weakness, Mental status change  Seizure Precautions: Yes (comment date of last seizure)  Communication Precautions: Speak loudly, Speak slowly  Other Precautions: L hemiplegia, wound vac on abdomen, sacral wounds    Personal Protective Equipment (PPE)  gloves and procedure mask    Updated X-Rays/Tests/Labs:  Lab Results   Component Value Date/Time    HGB 8.7 (L) 08/04/2022 04:04 AM    HGB 8.6 (L) 06/03/2022 04:23 AM    HCT 26.7 (L) 08/04/2022 04:04 AM    HCT 26.8 (L) 06/03/2022 04:23 AM    K 4.2 08/04/2022 04:04 AM    K 3.6 06/03/2022 04:23 AM    NA 141 08/04/2022 04:04 AM    NA 137 06/03/2022 04:23 AM    INR 1.1 07/14/2022 04:35 AM    INR 1.1 06/01/2022 03:15 AM    TROPI 9.6 05/26/2022 06:37 PM    TROPI -2.2 05/26/2022 06:37 PM    TROPI 11.8 05/26/2022 03:26 PM       All imaging reviewed, please see chart for details.      Subjective:  I'm feeling better.     Patient Goal: pt wants  to walk    Pain Assessment  Pain Assessment: No/denies pain           Patient's medical condition is appropriate for Physical Therapy intervention at this time.  Patient is agreeable to participation in the therapy session. Family and/or guardian are agreeable to patient's participation in the therapy session. Nursing clears patient for therapy.      Objective:  Observation of Patient/Vital Signs:  BP 151/81   Pulse 85   Temp 98.6 F (37 C) (Axillary)   Resp 18   Ht 1.575 m (5' 2)   Wt 82.5 kg (181 lb 14.1 oz)   SpO2 98%   BMI 33.27 kg/m   VSS     Cognition/Neuro Status  Arousal/Alertness: Appropriate responses to stimuli  Attention Span: Attends to task with redirection  Orientation Level: Oriented to person;Disoriented to place;Disoriented to time;Disoriented to situation  Memory: Decreased short term memory;Decreased recall of recent events  Following Commands: Follows one step commands with repetition;Follows one step commands with increased time  Safety Awareness: moderate verbal instruction  Insights: Educated in safety awareness  Problem Solving: maximum assistance  Behavior: calm;flat affect  Motor Planning: decreased initiation;decreased processing speed  Coordination: [facility] impaired;[facility] impaired    Musculoskeletal Examination  Functional Mobility  Rolling: Dependent  Supine to Sit: Maximal Assist;to Right (x 2)  Scooting to HOB: Dependent  Scooting to EOB: Maximal Assist  Sit to Supine: Maximal Assist;to Left  Sit to Stand: Unable to assess (Comment)     Locomotion  Ambulation: Unable to assess (Comment)  Distance Walked (ft) (Step 6,7): 0 Feet    Therapeutic Exercise  Knee AROM : several repetitions of LAQs with RLE, 75% of time when verbally instr  Ankle Pumps: several repetitions per side, very slow limited movement on LLE     Neuro Re-Ed  Sitting Balance: with support;maximal assist         mRS Scale - mRS Certified Providers  I am certified in mRS: Yes  mRS Assessment  Source: Patient  mRS Value: 5 - Severe disability        Educated the Patient and Family to role of physical therapy, plan of care, goals of therapy and HEP, safety with mobility and ADLs, energy conservation techniques, discharge instructions, home safety with verbalized understanding  and demonstrated understanding.    Patient left in bed with alarm and all other medical equipment in place and call bell and all personal items/needs within reach.  RN notified of session outcome. CM team and attending have been previously notified of d/c recommendation; no updates to d/c recommendation since that time.      Assessment:  Pt with significantly improved mentation in today's session. Able to speak in clear, complete sentences, comprehend questioning and reply appropriately. Physical deficits remain severe deconditioning and L inattention limiting functional mobility, although in today's session able to move L arm several times to reposition self and WB more comfortably, perform limited slowed left ankle dorsiflexion, and more consistent R knee extension. Pt is suitable for SNF.                 PMP Activity: Step 4 - Dangle at Bedside  Distance Walked (ft) (Step 6,7): 0 Feet    Plan:  Treatment/Interventions: Exercise, Gait training, Stair training, Neuromuscular re-education, Functional transfer training, LE strengthening/ROM, Endurance training, Cognitive reorientation, Patient/family training, Equipment eval/education, Bed mobility, Continued evaluation      PT Frequency: 2-3x/wk   Continue plan of care.    Goals:  Goals  Goal Formulation: With patient/family  Time for Goal Acheivement: By time of discharge  Goals: Select goal  Pt Will Go Supine To Sit: to maximize functional mobility and independence, with moderate assist, Not met  Pt Will Sit Edge of Bed: 1-2 min, to maximize functional mobility and independence, with moderate assist, Partly met  Pt Will Perform Sit to Stand: to maximize functional mobility and  independence, with moderate assist, Not met  Pt Will Transfer Bed/Chair: with moderate assist, to maximize functional mobility and independence, Not met  Pt Will Ambulate: 31-50 feet, with rolling walker, with moderate assist, to maximize functional mobility and independence, Not met      Gilberto Mariner, PT, DPT   [place] License #[id number]  [facility]  M 10-6:30, T 10-4:30, W 10-5:30, Th 10-8:30 F 11-8:30  Spectralink 2425  08/05/2022 6:25 PM      [facility]  Patient: Shelby Barron MRN#: [id number]  Unit: [facility] Barron [address] [facility] Bed: [phone]

## 2022-08-05 NOTE — Progress Notes (Addendum)
 CM received call from [facility] who reported Insurance Auth remains pending.  CM made STAT PT/OT for updated notes. CM will continue to follow and coordinate all d/c needs.

## 2022-08-05 NOTE — Plan of Care (Signed)
 NURSING SHIFT NOTE     Patient: Shelby Barron  Day: 38      SHIFT EVENTS     Shift Narrative/Significant Events (PRN med administration, fall, RRT, etc.):     Pt is A&Ox1. Pt reported pain during shift treated with scheduled Tylenol . Pt became talkitive during shift with family members present, answering questions about herself and expressing needs. Patient completed hemodialysis today at the beginning of shift. Will continue to follow plan of care.    Safety and fall precautions remain in place. Purposeful rounding completed.          ASSESSMENT     Changes in assessment from patient's baseline this shift:    Neuro: No  CV: No  Pulm: No  Peripheral Vascular: No  HEENT: No  GI: No  BM during shift: yes, Last BM: 08/05/2022  GU: No   Integ: No  MS: No        Mobility: PMP Activity: Step 3 - Bed Mobility of Distance Walked (ft) (Step 6,7): 0 Feet           Lines     Patient Lines/Drains/Airways Status       Active Lines, Drains and Airways       Name Placement date Placement time Site Days    Permacath Catheter - Tunneled 07/14/22 Subclavian vein catheter Right 07/14/22  1404  -- 21    Midline IV 07/01/22 Anterior;Left Upper Arm 07/01/22  1315  Upper Arm  34                         VITAL SIGNS     Vitals:    08/05/22 1300   BP: 102/57   Pulse: 77   Resp:    Temp:    SpO2:        Temp  Min: 97.3 F (36.3 C)  Max: 99.1 F (37.3 C)  Pulse  Min: 64  Max: 81  Resp  Min: 14  Max: 19  BP  Min: 101/47  Max: 162/71  SpO2  Min: 96 %  Max: 100 %      Intake/Output Summary (Last 24 hours) at 08/05/2022 1302  Last data filed at 08/05/2022 9077  Gross per 24 hour   Intake 478 ml   Output 0 ml   Net 478 ml                CARE PLAN        Problem: Moderate/High Fall Risk Score >5  Goal: Patient will remain free of falls  Outcome: Progressing  Flowsheets (Taken 08/05/2022 0800)  High (Greater than 13):  SABRA HIGH-Consider use of low bed  . HIGH-Initiate use of floor mats as appropriate     Problem: Compromised Sensory Perception  Goal:  Sensory Perception Interventions  Outcome: Progressing  Flowsheets (Taken 08/05/2022 1255)  Sensory Perception Interventions: Offload heels, Pad bony prominences, Reposition q 2hrs/turn Clock, Q2 hour skin assessment under devices if present     Problem: Compromised Moisture  Goal: Moisture level Interventions  Outcome: Progressing  Flowsheets (Taken 08/05/2022 1255)  Moisture level Interventions: Moisture wicking products, Moisture barrier cream     Problem: Compromised Activity/Mobility  Goal: Activity/Mobility Interventions  Outcome: Progressing  Flowsheets (Taken 08/05/2022 1255)  Activity/Mobility Interventions: Pad bony prominences, TAP Seated positioning system when OOB, Promote PMP, Reposition q 2 hrs / turn clock, Offload heels     Problem: Compromised Friction/Shear  Goal: Friction and Shear Interventions  Outcome: Progressing  Flowsheets (Taken 08/05/2022 1255)  Friction and Shear Interventions: Pad bony prominences, Off load heels, HOB 30 degrees or less unless contraindicated, Consider: TAP seated positioning, Heel foams     Problem: Inadequate Airway Clearance  Goal: Patent Airway maintained  Outcome: Progressing  Flowsheets (Taken 08/05/2022 1255)  Patent airway maintained:  . Position patient for maximum ventilatory efficiency  . Provide adequate fluid intake to liquefy secretions  . Suction secretions as needed  . Reinforce use of ordered respiratory interventions (i.e. CPAP, BiPAP, Incentive Spirometer, Acapella, etc.)  . Reposition patient every 2 hours and as needed unless able to self-reposition  Goal: Normal respiratory rate/effort achieved/maintained  Outcome: Progressing  Flowsheets (Taken 08/05/2022 1255)  Normal respiratory rate/effort achieved/maintained: Plan activities to conserve energy: plan rest periods     Problem: Infection Prevention  Goal: Free from infection  Outcome: Progressing  Flowsheets (Taken 08/05/2022 1255)  Free from infection:  . Monitor/assess vital signs  . Encourage/assist  patient to turn, cough and perform deep breathing every 2 hours  . Assess incision for evidence of healing  . Monitor/assess output from surgical drain if present  . Assess for signs and symptoms of infection  . Monitor/assess lab values and report abnormal values     Problem: Impaired Mobility  Goal: Mobility/Activity is maintained at optimal level for patient  Outcome: Progressing  Flowsheets (Taken 08/05/2022 1255)  Mobility/activity is maintained at optimal level for patient:  . Encourage independent activity per ability  . Consult/collaborate with Physical Therapy and/or Occupational Therapy     Problem: Constipation  Goal: Fluid and electrolyte balance are achieved/maintained  Outcome: Progressing  Flowsheets (Taken 08/05/2022 1255)  Fluid and electrolyte balance are achieved/maintained:  . Monitor/assess lab values and report abnormal values  . Assess and reassess fluid and electrolyte status  . Observe for cardiac arrhythmias  . Monitor for muscle weakness     Problem: Safety  Goal: Patient will be free from injury during hospitalization  Outcome: Progressing  Flowsheets (Taken 08/05/2022 1255)  Patient will be free from injury during hospitalization:  . Assess patient's risk for falls and implement fall prevention plan of care per policy  . Use appropriate transfer methods  . Provide and maintain safe environment  . Include patient/ family/ care giver in decisions related to safety  . Ensure appropriate safety devices are available at the bedside  . Hourly rounding  . Assess for patients risk for elopement and implement Elopement Risk Plan per policy  . Provide alternative method of communication if needed (communication boards, writing)  Goal: Patient will be free from infection during hospitalization  Outcome: Progressing  Flowsheets (Taken 08/05/2022 1255)  Free from Infection during hospitalization:  . Monitor lab/diagnostic results  . Monitor all insertion sites (i.e. indwelling lines, tubes, urinary  catheters, and drains)  . Assess and monitor for signs and symptoms of infection  . Encourage patient and family to use good hand hygiene technique     Problem: Hemodynamic Status: Cardiac  Goal: Stable vital signs and fluid balance  Outcome: Progressing  Flowsheets (Taken 08/05/2022 1255)  Stable vital signs and fluid balance:  . Assess signs and symptoms associated with cardiac rhythm changes  . Monitor lab values     Problem: Renal Instability  Goal: Fluid and electrolyte balance are achieved/maintained  Outcome: Progressing  Flowsheets (Taken 08/05/2022 1255)  Fluid and electrolyte balance are achieved/maintained:  . Monitor/assess lab values and report abnormal values  . Assess and reassess fluid and electrolyte  status  . Observe for cardiac arrhythmias  . Monitor for muscle weakness  . Follow fluid restrictions/IV/PO parameters     Problem: Patient Receiving Advanced Renal Therapies  Goal: Therapy access site remains intact  Outcome: Progressing  Flowsheets (Taken 08/05/2022 1255)  Therapy access site remains intact:  . Assess therapy access site  . Change therapy access site dressing as needed     Problem: Fluid and Electrolyte Imbalance/ Endocrine  Goal: Fluid and electrolyte balance are achieved/maintained  Outcome: Progressing  Flowsheets (Taken 08/05/2022 1255)  Fluid and electrolyte balance are achieved/maintained:  . Monitor/assess lab values and report abnormal values  . Assess and reassess fluid and electrolyte status  . Observe for cardiac arrhythmias  . Monitor for muscle weakness     Problem: Diabetes: Glucose Imbalance  Goal: Blood glucose stable at established goal  Outcome: Progressing  Flowsheets (Taken 08/05/2022 1255)  Blood glucose stable at established goal:  . Monitor lab values  . Monitor intake and output.  Notify LIP if urine output is < 30 mL/hour.  . Follow fluid restrictions/IV/PO parameters  . Include patient/family in decisions related to nutrition/dietary selections  . Assess for  hypoglycemia /hyperglycemia  . Monitor/assess vital signs  . Coordinate medication administration with meals, as indicated  . Ensure appropriate diet and assess tolerance  . Ensure adequate hydration  . Ensure appropriate consults are obtained (Nutrition, Diabetes Education, and Case Management)  . Ensure patient/family has adequate teaching materials     Problem: Inadequate Gas Exchange  Goal: Patent Airway maintained  Outcome: Progressing  Flowsheets (Taken 08/05/2022 1255)  Patent airway maintained:  . Position patient for maximum ventilatory efficiency  . Provide adequate fluid intake to liquefy secretions  . Suction secretions as needed  . Reinforce use of ordered respiratory interventions (i.e. CPAP, BiPAP, Incentive Spirometer, Acapella, etc.)  . Reposition patient every 2 hours and as needed unless able to self-reposition     Problem: Every Day - Stroke  Goal: Mobility/Activity is maintained at optimal level for patient  Outcome: Progressing  Flowsheets (Taken 08/05/2022 1255)  Mobility/activity is maintained at optimal level for patient:  . Encourage independent activity per ability  . Consult/collaborate with Physical Therapy and/or Occupational Therapy  Goal: Core/Quality measure requirements - Daily  Outcome: Progressing  Flowsheets (Taken 08/05/2022 1255)  Core/Quality measure requirements - Daily:  . VTE Prevention: Ensure anticoagulant(s) administered and/or anti-embolism stockings/devices documented by end of day 2  . Ensure antithrombotic administered or contraindication documented by LIP by end of day 2  Goal: Stable vital signs and fluid balance  Outcome: Progressing  Flowsheets (Taken 08/05/2022 1255)  Stable vital signs and fluid balance:  . Position patient for maximum circulation/cardiac output  . Monitor and assess vitals every 4 hours or as ordered and hemodynamic parameters  . Monitor intake and output. Notify LIP if urine output is < 30 mL/hour.  SABRA Apply telemetry monitor as ordered      Problem: Everyday - Heart Failure  Goal: Mobility/Activity is Maintained at Optimal Level for Patient  Outcome: Progressing  Flowsheets (Taken 08/05/2022 1255)  Mobility/Activity is Maintained at Optimal Level for Patient:  . Reposition patient every 2 hours and as needed unless able to reposition self  . Increase mobility as tolerated/progressive mobility protocol  . Maintain SCD's as Ordered  . Perform active/passive ROM  . Assess for changes in respiratory status, level of consciousness and/or development of fatigue  . Consult with Physical Therapy and/or Occupational Therapy  Goal: Nutritional Intake is  Adequate  Outcome: Progressing  Flowsheets (Taken 08/05/2022 1255)  Nutritional Intake is Adequate:  . Fluid Restricction if needed  . Consult/Collaborate with Nutritionist  . Patient and family teaching on low sodium diet  . Cardiac diet-2 gm Sodium  . Assess appetite,anorexia and amount of meal/food tolerated  . Encourage/perform oral hygiene as appropriate

## 2022-08-05 NOTE — Progress Notes (Signed)
 Uf of 2.1 liters. Tolerated fair. Pt. Restless and confused on and off. Bp low at times. Sat 100% on RA. No edema. Telemetry SR. No acute sob noted. Dressing to right permcath dry and intact. Report called to Ames Norse RN   08/05/22 1326   Treatment Summary   Time Off Machine 1330   Duration of Treatment (Hours) 3.5   Treatment Type 2:1   Dialyzer Clearance Moderately streaked   Fluid Volume Off (mL) 2500   Prime Volume (mL) 200   Rinseback Volume (mL) 200   Fluid Given: Normal Saline (mL) 0   Fluid Given: PRBC  0 mL   Fluid Given: Albumin  (mL) 0   Fluid Given: Other (mL) 0   Total Fluid Given 400   Hemodialysis Net Fluid Removed 2100   Post Treatment Assessment   Post-Treatment Weight (Kg) 84.8   Patient Response to Treatment TOLERATED FAIR. VERY RESTLESS AND CONFUSED ON AND OFF   Permacath Catheter - Tunneled 07/14/22 Subclavian vein catheter Right   Placement Date/Time: 07/14/22 1404   Inserted by: Dr. Merleen  Access Type: Subclavian vein catheter  Orientation: Right  Central Line Infection Prevention Education provided?: Yes  Hand Hygiene: Alcohol based hand scrub;Soap and water   Line cart u...   Line necessity reviewed? Apheresis/hemodialysis   Site Assessment Clean;Dry;Intact   Catheter Lumen Volume Venous 1.6 mL   Catheter Lumen Volume Arterial 1.6 mL   Dressing Status and Intervention Dressing Intact;Clean & Dry   Tego/Curos Caps on Catheter Yes   NEW Tego/Curos Caps placed (Date) 08/05/22   End Caps Free From Blood Yes   Line Care Connections checked and tightened   Dressing Type Transparent;Biopatch   Dressing Status Clean;Dry;Intact   Dressing/Line Intervention Caps changed   Vitals   Temp 97.6 F (36.4 C)   Heart Rate 71   Resp Rate 18   BP 144/67   SpO2 100 %   O2 Device None (Room air)   Assessment   Mental Status Alert;Restless;Disoriented   Cardiac (WDL) WDL   Cardiac Regularity Return to [facility]   Cardiac Symptoms Return to [facility]   Cardiac Rhythm Normal sinus rhythm   Respiratory  WDL    Respiratory Pattern Return to WDL   Bilateral Breath Sounds Clear;Diminished   R Breath Sounds Diminished   L Breath Sounds Diminished   Cough Weak   Edema  WDL   Generalized Edema Return to [facility]   Facial Return to [facility]   RLE Edema Return to WDL   LLE Edema Return to [facility]   General Skin Color Appropriate for ethnicity   Skin Temp Warm   Gastrointestinal (WDL) WDL   Abdomen Inspection Return to WDL   GI Conditions Return to [facility]   Mobility Bed   Pain Assessment   Charting Type Reassessment   Pain Scale Used Numeric Scale (0-10)   Numeric Pain Scale   Pain Score 0   POSS Score 1   Education   Person taught Patient   Knowledge basis Minimal   Topics taught Procedure   Teaching Tools Explain   Reponse No Evidence of Learning   Bedside Nurse Communication   Name of bedside RN - post dialysis AMES NORSE RN

## 2022-08-05 NOTE — Progress Notes (Signed)
 Infectious Diseases & Tropical Medicine  Progress Note    08/05/2022   Shelby Barron RDW:[id number] is a 82 y.o. female,       Assessment:     Encephalopathy  Acute CVA  History of bladder cancer and renal failure  Status post radical cystectomy/bilateral nephrectomy, enterectomy and lysis of adhesion (06/28/2022)  S/p small bowel resection  Abdominal wall wound s/p bedside debridement/wound VAC placement (08/02/2022)  End-stage renal disease on hemodialysis  Atrial fibrillation  Risk of aspiration    Plan:     Continue monitoring without antibiotics  Continue local wound care  Follow-up chest x-ray  Continue probiotics  Monitor electrolytes and renal functions closely  Monitor clinically    ROS:     General: No fever, sleepy but arousable, receiving hemodialysis  Respiratory: No cough or shortness of breath  Gastrointestinal: No abdominal pain  Genito-Urinary: no hematuria  Musculoskeletal: no edema  Neurological: Sleepy  Dermatological: No rash    Physical Examination:     Blood pressure 162/71, pulse 74, temperature 97.5 F (36.4 C), temperature source Oral, resp. rate 18, height 1.575 m (5' 2), weight 82.5 kg (181 lb 14.1 oz), SpO2 99%.    General Appearance: Appears comfortable  HEENT: Pupils are equal, round, and reactive to light.   Lungs: Clear to auscultation  Heart: Regular rate and rhythm  Chest: Symmetric chest wall expansion.   Abdomen: soft, no hepatosplenomegaly, no bowel sounds, abdominal wall wound VAC in place  Neurological: Sleepy  Extremities: No edema    Laboratory And Diagnostic Studies:     Recent Labs     08/04/22  0404 08/03/22  0637   WBC 11.91* 13.17*   Hemoglobin 8.7* 8.8*   Hematocrit 26.7* 27.6*   Platelet Count 263 266     Recent Labs     08/04/22  0404 08/03/22  0637   Sodium 141 140   Potassium 4.2 4.9   Chloride 103 102   CO2 25 22   BUN 25* 45*   Creatinine 3.8* 5.8*   Glucose 94 82   Calcium  8.4 8.8     Recent Labs     08/04/22  0404 08/03/22  0637   AST (SGOT) 13  12   ALT 9 9   Alkaline Phosphatase 94 94   Protein, Total 4.4* 4.5*   Albumin  1.6* 1.6*         Current Meds:      Scheduled Meds: PRN Meds:    acetaminophen , 650 mg, Oral, 4 times per day   Or  acetaminophen , 650 mg, Oral, 4 times per day  amiodarone , 200 mg, per NG tube, Daily  apixaban , 2.5 mg, Oral, Q12H [facility]  atorvastatin , 40 mg, Oral, QHS  carvedilol , 12.5 mg, Oral, Q12H [facility]  darbepoetin alfa , 0.45 mcg/kg, Subcutaneous, Weekly  glycopyrrolate , 0.2 mg, Intravenous, Q8H  insulin  glargine, 5 Units, Subcutaneous, QAM  insulin  lispro, 1-3 Units, Subcutaneous, QHS  insulin  lispro, 1-5 Units, Subcutaneous, TID AC  lidocaine , 20 mL, Intradermal, Once  pantoprazole , 40 mg, per NG tube, Q12H [facility]  thiamine , 100 mg, per NG tube, Daily  umeclidinium-vilanterol, 1 puff, Inhalation, QAM  vitamins/minerals, 1 tablet, Oral, Daily        Continuous Infusions:       sodium chloride , , PRN  acetaminophen , 650 mg, Q6H PRN  albuterol -ipratropium, 3 mL, Q6H PRN  benzocaine , 1 spray, TID PRN  dextrose , 15 g of glucose, PRN   Or  dextrose , 12.5 g, PRN   Or  dextrose , 12.5 g, PRN   Or  glucagon  (rDNA), 1 mg, PRN  diphenhydrAMINE , 25 mg, Q6H PRN  hydrALAZINE , 10 mg, Q3H PRN  HYDROmorphone , 1 mg, Q4H PRN  labetalol , 10 mg, Q15 Min PRN  lidocaine  2% jelly, , Once PRN  naloxone , 0.2 mg, PRN  ondansetron , 4 mg, Q6H PRN  oxyCODONE , 2.5 mg, Q4H PRN  phenol, 1 spray, Q2H PRN  sodium chloride , 100 mL, Q1H PRN  sodium chloride , 250 mL, PRN          Nico Rogness A. Louella, M.D.  08/05/2022  9:14 AM

## 2022-08-05 NOTE — Discharge Instr - AVS First Page (Addendum)
 [facility] DISCHARGE INSTRUCTIONS     Date of Admission: 06/28/2022    Date of Discharge: August 18, 2022    Thank you for choosing the [name]  for your healthcare needs. It was a privilege caring for you. I am genuinely concerned about your health and comfort.      Below, please find detailed instructions regarding your medical care.     You were admitted for Bladder cancer. It is important that you make your follow up appointments listed below.  Bring these discharge papers to your follow-up visit with your medical provider.   I cannot stress the importance of follow up enough.      Consultants: surgery, ID, cardiology, nephro, palliative, GI and neurology     Further Instructions:    Please follow up with wound care outpatient for ongoing wound management/dressing changes  We recommend you follow up with surgery, GI and cardiology within 2 weeks of discharge  You may continue your dialysis on a routine basis. Follow up with nephrology as needed.  We recommend you consider following up with palliative care as an outpatient for ongoing symptom management and goals of care discussions.  PCP follow up in 1 week.  Megace  and midodrine  have been added to the regimen to help with appetite stimulation along with improving variability of blood pressure    Follow up Appointments:   Follow-up Information       [facility] [place]. Call.    Specialty: Gastroenterology  Why: please call to make f/u in 6 weeks.  Contact information:  [address]  [place]  [address]  [phone]  Additional information:  Take [address] to [place], [place] [address] [place].  Turn right on [address] and then right again at [employer].  Destination will be on your right.               Drema Collin, MD Follow up.    Specialties: Nephrology, Internal Medicine  Contact information:  [address]  [address] [address]  [phone]               [place]  [facility]  [facility] -Cardiology Follow up in 1 month(s).    Specialty: Cardiology  Contact information:  [address]  [place]  [address]  [phone]             Willard Magyar, MD Follow up in 1 week(s).    Specialty: Surgery  Contact information:  [address]  [place] [address]  [phone]               [facility] Follow up in 1 week(s).    Contact information:  [address]  [address]  [place]  [address]                               Please take your medications as prescribed. As we discussed, please review the attached handouts for more information about important side effects of your medication.    If you have any fever, chest pain, palpitations, shortness of breath or difficulty breathing, worsening nausea and vomiting, or lower leg pain please seek medical attention immediately.     Finally, below please find the results of imaging studies that you had in the hospital. Please review this with your primary care doctor.    You will likely  be receiving a survey about the care you received in our hospital. It would mean a lot to me and all of the care team if you could fill that out and return the form. We're always looking to improve and your feedback will help us  do that.     I wish you good health. Please do not hesitate to contact me if I can be of assistance. I aim to provide excellent care.     Respectfully Yours,    Flint Duet, MD        If you are unable to obtain an appointment, unable to obtain newly prescribed medications, or are unclear about any of your discharge instructions please contact your discharging physician at [phone] (M-F, 8am-3pm) or weekends and after hours via the hospital operator [phone], hospital case manager, or your primary care physician.    Imaging Studies while hospitalized:  XR Chest AP Portable    Result Date: 08/02/2022   Hypoventilation with basilar atelectasis. Mild central vascular  congestion. Saddie Goodell, MD 08/02/2022 3:23 PM    CT Head WO Contrast    Result Date: 07/26/2022   1.No significant change in multifocal areas of subacute infarction within bilateral cerebral hemispheres. 2.Small areas of petechial hemorrhage in the left parietal and occipital lobes are unchanged. No new hemorrhage or measurable parenchymal hematoma. 3.Persistent complete opacification of the right mastoid air cells and middle ear cavity. Justus Radford, MD 07/26/2022 12:27 PM    CT Head WO Contrast    Result Date: 07/25/2022   1.Redemonstration of multifocal areas of bilateral acute infarctions. A few areas of cortical hyperdensity likely represent petechial hemorrhage. No mass occupying hematoma. No significant mass effect or midline shift. 2.Redemonstrated right mastoid and right middle ear cavity effusion with areas of bony thinning versus dehiscence of the right tegmen tympani and right tegmen mastoideum. Andrew Nova, MD 07/25/2022 2:19 PM    CT Abdomen Pelvis W IV And PO Cont    Result Date: 07/19/2022   1.Slight interval increase in left-sided retroperitoneal fluid collection with the other abdominal fluid collections appearing relatively stable. 2. There is no definite abscess or acute inflammatory process. 3. Incidental note is made of a small paraesophageal type hernia. 4. Sigmoid diverticulosis is noted without CT evidence of diverticulitis. 5. Other findings as noted above. Austin Door, MD 07/19/2022 6:36 AM    MRI Brain WO Contrast    Result Date: 07/18/2022   Interval evolution of multifocal recent infarcts in the bilateral cerebral hemispheres. No new infarct. Milan Arne Jumbo, MD 07/18/2022 5:57 PM    XR Chest AP Portable    Result Date: 07/17/2022  1.Near-complete resolution of interstitial edema. 2.Improving pulmonary vascular congestion. Lillette CHRISTELLA Gillis, MD 07/17/2022 2:38 PM    CT Head WO Contrast    Result Date: 07/17/2022   1.No new acute intracranial pathology. 2.There are multiple regions of cytotoxic edema  in the right > left frontal and left > right occipital lobes, consistent with known early subacute infarcts. Lamar Cinnamon 07/17/2022 8:26 AM    US  Carotid Duplex Dopp Comp Bilateral    Result Date: 07/15/2022   1. Plaque formation in the right internal carotid artery bulb with elevated velocities. Stenosis is estimated at 50-69%. 2. Plaque in the left internal carotid artery with normal velocities. Any stenosis is less than 50%. Criteria for stenosis and velocity parameters are based on the guidelines of the SRU (Society of Radiologists in Ultrasound). Grant et al: Carotid Artery Stenosis: Grayscale and  Doppler Ultrasound Diagnosis-- Society of Radiologists in Ultrasound Consensus Conference Ultrasound Quarterly. Volume 19, Number 4; December, 2003. Recommendations updated October 2021 [facility] is an [facility] accredited facility Ozell JONETTA Aho, MD 07/15/2022 9:41 AM    Tunneled Cath Removal (Permcath)    Result Date: 07/14/2022  1. Nonfunctioning tunneled dialysis catheter as described above. 2. Successful placement of a new 19 cm Equistream catheter as described above. Dimitrios Papadouris, MD 07/14/2022 4:06 PM    Tunneled Cath Placement (Permcath)    Result Date: 07/14/2022  1. Nonfunctioning tunneled dialysis catheter as described above. 2. Successful placement of a new 19 cm Equistream catheter as described above. Dimitrios Papadouris, MD 07/14/2022 4:06 PM    MRI Brain WO Contrast    Result Date: 07/13/2022   1.Patchy areas of acute/recent infarction involving the cerebral hemispheres bilaterally. The infarcts have a configuration suggesting watershed territory infarcts. 2.Chronic ischemic changes as discussed. Quintin Chihuahua, MD 07/13/2022 10:28 PM    CT Head WO Contrast    Result Date: 07/13/2022   1.New region of loss of gray-white matter differentiation in the right frontal lobe, concerning for acute infarction. 2.A few additional areas of poor gray-white matter differentiation for example in the left occipital lobe may be  artifactual but could be correlated with MRI. 3.No evidence of acute intracranial hemorrhage or significant mass effect. These urgent results were discussed with and acknowledged by Dr. PINK on 07/13/2022 3:20 PM. Andrew Nova, MD 07/13/2022 3:21 PM    XR Chest AP Portable    Result Date: 07/10/2022  Interstitial markings have slightly increased, suggesting minimal edema. No other change. Wolm Luis, MD 07/10/2022 4:49 PM   Echo Results       None          Results for orders placed or performed during the hospital encounter of 06/28/22   MRI Brain WO Contrast    Narrative    HISTORY: Declining mental status, right gaze deviation.     COMPARISON: CT head 07/17/2022 and MRI brain 07/13/2022.     TECHNIQUE: MRI of the brain performed on a 1.5 Tesla scanner without  intravenous contrast.    CONTRAST: None.    FINDINGS:     Interval evolution of multifocal recent infarcts with associated edema in  the bilateral cerebral hemispheres. No new infarct. Mild chronic small  vessel ischemic changes within the cerebral white matter. Mild generalized  cerebral volume loss.     No acute hemorrhage, extra-axial fluid collection, or midline shift. No  mass lesion or vasogenic edema.     Normal appearance of the sella. Cerebellar tonsils are appropriately  positioned. No acute hydrocephalus. Patent basal cisterns. Skull base flow  voids are intact.    No osseous abnormality. No acute orbital abnormality. Status post bilateral  lens replacement. No significant paranasal sinus mucosal disease. Severe  opacification of the right mastoid air cells and middle ear cavity. Mild  opacification of left mastoid air cells.       Impression         Interval evolution of multifocal recent infarcts in the bilateral cerebral  hemispheres. No new infarct.    Milan Arne Jumbo, MD  07/18/2022 5:57 PM   CT Head WO Contrast    Narrative    HISTORY: Petechial hemorrhage follow up.     COMPARISON: Multiple priors most recent CT  07/25/2022    TECHNIQUE: CT of the head performed without intravenous contrast. The  following dose reduction techniques were utilized: automated exposure  control  and/or adjustment of the mA and/or KV according to patient size,  and the use of an iterative reconstruction technique.    CONTRAST: None.    FINDINGS:  Multifocal areas of subacute infarction within bilateral cerebral  hemispheres remain similar to prior. Small areas of petechial hemorrhage in  the left parietal and occipital lobes are unchanged. No new hemorrhage or  measurable parenchymal hematoma.    There is a background moderate chronic microangiopathic changes in the  periventricular white matter. Moderate generalized volume loss. No  extra-axial fluid collection or midline shift. Ventricles and basal  cisterns are patent.    Persistent complete opacification of the right mastoid air cells and middle  ear cavity. Paranasal sinuses are clear.      Impression       1.No significant change in multifocal areas of subacute infarction within  bilateral cerebral hemispheres.  2.Small areas of petechial hemorrhage in the left parietal and occipital  lobes are unchanged. No new hemorrhage or measurable parenchymal hematoma.  3.Persistent complete opacification of the right mastoid air cells and  middle ear cavity.    Justus Radford, MD  07/26/2022 12:27 PM       Please be sure to review these imaging studies with your primary medical provider.

## 2022-08-05 NOTE — UM Notes (Addendum)
 [facility] Utilization Review  NPI: [id number], Tax ID: [id number]  Please Call [phone] with any questions or concerns. Confidential voicemail.  Fax final authorizations and requests for additional information Fax to [phone]   Patient: Shelby Barron  Date: 07/30/2022   82 y.o., March 10, 1940   Admission Date: 06/28/2022     CSR 08/05/22     Shelby Barron is a 82 y.o. 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 s/p bilateral radical nephrectomies and  cystectomy on 06/28/22  - complicated by small bowel enterotomy and blood loss   New onset afib - on amiodarone  and anticoagulation  High grade urothelial bladder CA: s/p bilateral nephrectomies and cystectomy c/b mall bowel enterotomy and blood loss (~1L) on 06/28/22  - awaiting wound debridement   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 - remains low, adequate iron  - on aranesp   Encephalopathy - better  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   HTN - BP better      HD today per routine  Awaiting SNF placement  Recommendations: 08/05/22    ASSESSMENT/PLAN 08/04/22      Shelby Barron is a 82 y.o. female admitted with Bladder cancer     [name], [name], type 2 diabetes, renal cell carcinoma status post partial bilateral nephrectomy, [name], new diagnosis of urothelial cancer initally presented on 6/10 for elective cystectomy and completion nephrectomy, [name], [name], extubated and off pressors since 6/13.   She has been along hospital course,  complicated by acute stroke and new onset A-fib.  Patient started on anticoagulation on 7/5     H/H drop but no overt bleeding on 7/7. Transfused 1U pRBCs. Also started on Aranesp . CTH showed few areas of cortical hyperdensity - petechial hemorrhage vs cortical laminar necrosis. OK to continue with eliquis  per neuro given stable neuro exam.  Repeated CT head stable  She had NG tube and she passed SLP and NG tube was removed  Her wound has necrotic tissue and she had bedside debridement by general surgery on 08/02/2022  She got Dilaudid  in the morning for debridement and in the afternoon patient was found to be less responsive stroke code was called but was canceled patient was given Narcan  and with frequent painful stimuli patient will wake up and was more responsive and back to her baseline     Anticipated Discharge: Hopefully within 24-48 hours     Barriers to Discharge: Clinical improvement     Family/Social/Case Mgr Assistance: Patient daughter needs to choose SNF place  Toxic metabolic Encephalopathy/delirium resolving  - On and off confusion alert oriented x 1-2, now relatively stable the last few days  - Patient was upgraded to ICU on 6/30 for lethargy and decreased responsiveness  - CT head was repeated on 6/29, no acute finding  - ABG consistent with respiratory alkalosis  - EEG consistent with encephalopathy no seizure, MRI no acute stroke  - CT  abdomen with and without contrast after premedication did not show any leak or abscess  - SLP advanced diet. NG tube removed 7/9  - Palliative consulted:  Family is clear they would like no limits to care and patient should remain full code.     Anemia, 2/2 CKD and blood loss during OR  - TSAT 18%  - continue Aranesp  per nephrology  - Trend CBC, transfuse Hgb<7. Currently stable.  - No active signs of bleeding     s/p open cystectomy, bilateral nephrectomies, and resection of small bowel after intra-op enterotomy    Mixed shock - hemorrhagic vs septic:  Resolved  Necrotic wound tissue  - Status post wound debridement by general surgery at bedside on 7/15  - 6/10 small bowel enteretomy with 1L EBL. Went to the OR on June 12 for small bowel anastomosis, closure of mesenteric defect, closure of abdominal wall with wound VAC application. Wound VAC last changed on 7/15  - Surgery consulted, recommend CTAP with PO contrast that showed no anastomotic leak. Low concern for abscesses as they'd expect these to have a more loculated appearance in the interim between 6/17 and 7/1 imaging  - ID consulted, completed meropenem  and Mycamine  for 2 weeks on 7/3. No further abx recommended after bedside debridement on 7/15     Acute CVA   Petechial hemorrhage vs cortical laminar necrosis  - Switched from daily ASA to Eliquis  per neurology on 7/5 as > 10d post CVA  - Carotid Doppler showed plaque with no critical stenosis  - Head CT on 7/7 w/ a few areas of cortical hyperdensity - petechial hemorrhage vs cortical laminar necrosis. Discussed with neuro OK to continue eliquis  for now. Repeat head CT on 7/8 stable.  - EEG encephalopathic pattern no seizure, repeat MRI no new infarction  - PT OT recommend SNF     Acute hypoxemic respiratory failure resolved  COPD  Extubated on 6/13, currently on room air  X-ray repeated on 6/29 showed resolution of interstitial edema     ESRD  S/p CRRT this admission, now on HD TTS  Continue dialysis per nephrology  IR removed the nonfunctional permacath and right [place] IJ cath and replaced with a new permacath on 6/26.     HTN  On coreg      New onset Afib with RVR, improved  - CHA2DS2-VASc score is 7  - Status post amiodarone  drip. Now stable on oral amiodarone  and coreg  per cardiology recommendations  - GI and general surgery cleared for anticoagulation. Started on Eliquis  since 07/23/22 with neurology clearance     Renal cell carcinoma outpatient follow-up hematology oncology  Urothelial carcinoma  Thrombocytopenia  - VTE ppx: SCDs, eliquis       Diabetes Mellitus type 2  -Sliding scale insulin      Nutrition: Pured diet as tolerated  Malnutrition Documentation     Moderate Malnutrition related to inadequate nutritional intake in the setting of acute illness as evidenced by <50% EER > 5 days, mild muscle losses (temporalis, deltoid) - new          Patient has BMI=Body mass index is 36.05 kg/m.  Diagnosis: Obesity based on BMI criteria        Lab 08/04/22  0404 08/03/22  0637 08/02/22  0527    Hemoglobin 8.7* 8.8* 8.8*    Hematocrit 26.7* 27.6* 27.1*    MCV 91.1 91.4 90.6    WBC 11.91* 13.17* 13.08*    Platelet Count 263 266 270  Lab 07/18/22  0421 06/02/22  0514    Ferritin 2,300.21*  --     Iron  18* 62    TIBC 99* 145*    Iron  Saturation 18 43       Anemia Diagnosis: Already documented in note     Code Status: Full Code     Dispo: SNF     Family Contact: Daughter Nat at bedside     DVT Prophylaxis:          Current Facility-Administered Medications (Includes Only Anticoagulants, Misc. Hematological)   Medication Dose Route Last Admin    apixaban  (ELIQUIS ) tablet 2.5 mg  2.5 mg Oral 2.5 mg at 08/04/22 0921                08/04/22 1528 08/04/22 2000 08/04/22 2348   BP: 161/61 143/65   Pulse: 73 74   Resp: 14 17   Temp: 99.1 F (37.3 C) 98.6 F (37 C)   TempSrc: Oral Oral   SpO2: 98% 100%   Access: R permacath    Scheduled Meds: PRN Meds:    acetaminophen , 650 mg, Oral, 4 times per day   Or  acetaminophen , 650 mg, Oral, 4 times per day  amiodarone , 200 mg, per NG tube, Daily  apixaban , 2.5 mg, Oral, Q12H [facility]  atorvastatin , 40 mg, Oral, QHS  carvedilol , 12.5 mg, Oral, Q12H [facility]  darbepoetin alfa , 0.45 mcg/kg, Subcutaneous, Weekly  glycopyrrolate , 0.2 mg, Intravenous, Q8H  insulin  glargine, 5 Units, Subcutaneous, QAM  insulin  lispro, 1-3 Units, Subcutaneous, QHS  insulin  lispro, 1-5 Units, Subcutaneous, TID AC  lidocaine , 20 mL, Intradermal, Once  pantoprazole , 40 mg, per NG tube, Q12H [facility]  thiamine , 100 mg, per NG tube,  Daily  umeclidinium-vilanterol, 1 puff, Inhalation, QAM  vitamins/minerals, 1 tablet, Oral, Daily            Continuous Infusions:             sodium chloride , , PRN  acetaminophen , 650 mg, Q6H PRN  albuterol -ipratropium, 3 mL, Q6H PRN  benzocaine , 1 spray, TID PRN  dextrose , 15 g of glucose, PRN   Or  dextrose , 12.5 g, PRN   Or  dextrose , 12.5 g, PRN   Or  glucagon  (rDNA), 1 mg, PRN  diphenhydrAMINE , 25 mg, Q6H PRN  hydrALAZINE , 10 mg, Q3H PRN  HYDROmorphone , 1 mg, Q4H PRN  labetalol , 10 mg, Q15 Min PRN  lidocaine  2% jelly, , Once PRN  naloxone , 0.2 mg, PRN  ondansetron , 4 mg, Q6H PRN  oxyCODONE , 2.5 mg, Q4H PRN  phenol, 1 spray, Q2H PRN

## 2022-08-05 NOTE — Progress Notes (Signed)
 Nutritional Support Services  Nutrition Follow-up    Shelby Barron 82 y.o. female   MRN: [id number]    Summary of Nutrition Recommendations:  Recommend follow SLP recommendations for diet consistency; hold Renal restriction as pt with ongoing variable intake and attempting to optimize nutritional status  Recommend adjust vitamin supplement to Nephrovite  Consider use of appetite stimulant to help intake  Continue Magic Cup BID and start Nepro qDay to help meet estimated needs  Each Magic Cup provides an additional 290 kcal and 9 gm protein.  Each Nepro shake provides 425 kcal, 19 gm protein, 250 mg potassium and 170 mg phosphorus.  Monitor wt trends and fluid status    -----------------------------------------------------------------------------------------------------------------                                                       ASSESSMENT DATA     Subjective Nutrition:   HD today, post HD wt of 84.8kg. Pt with ongoing variable PO intake with average of <50% over past 11 meals documented. SLP following, last seen 7/17 and recommended PU4 and MT2. Continue to encourage PO itnake. Discussed pt with MD and RN.     Events of Current Admission:  82 yo female with PHMx COPD, CVA, DM, renal cell carcinoma s/p partial b/l nephrectomy, [name] (05/2022), new diagnosis of urothelial CA, presenting for scheduled cystectomy and complete nephrectomy. OR course c/b small bowel enterotomy and significant bleeding.   6/11: CRRT initiated  6/12: OR for small bowel anastomosis, closure of mesenteric defect, closure of abdominal wall with wound VAC application  6/13: Concern for prolonged NPO status, TPN initiated, Extubation  6/15: ROBF, TPN completed with plan for transition to PO/EN pending swallow. Transition from CRRT to HD   6/16: SLP eval, rec NPO.  6/17: Feeding tube placed and tube feeding initiated  7/2: CLD initiated   7/8: Diet advanced to Pureed   7/9: NGT removed, TF d/c  7/15: Plan for wound debridement with  short course Abx    Medical Hx:  has a past medical history of Acid reflux, Asthma, well controlled, Cancer of kidney, Chronic lower back pain, Congestive heart disease, COPD (chronic obstructive pulmonary disease), CVA (cerebral infarction) (01/12/2003), Diabetes, Diabetic nephropathy, Fibroids, Gout, H/O: gout, Hyperlipidemia, Hypertension, Neuropathy of hand, and SOB (shortness of breath).     Orders Placed This Encounter   Procedures    Adult diet Therapeutic/ Modified; Solid; Pureed (IDDSI level 4); Mildly thick (IDDSI level 2); Room service: No    Magic Cup Quantity: A. One; Frequency: BID (2 times a day) - with Lunch and Dinner      08/01/22 1915 08/02/22 0838 08/02/22 1400   Intake (mL)   Percent Meal Consumed (%) 75% 50% 50%   Data Collected by  --  Clinical Technician Clinical Technician   Supplement Source Ensure High Protein  --   --       08/02/22 2236 08/03/22 0900 08/03/22 1612   Intake (mL)   Percent Meal Consumed (%) 0% 50% 50%   Data Collected by  --  Clinical Technician Clinical Technician   Supplement Source  --   --   --       08/04/22 0853 08/04/22 1344 08/04/22 1800   Intake (mL)   Percent Meal Consumed (%) 100% 0% 25%  Data Collected by Clinical Technician Clinical Technician  --    Supplement Source  --   --   --       08/04/22 2000 08/05/22 9077   Intake (mL)   Percent Meal Consumed (%) 50% 50%   Data Collected by Clinical Technician Clinical Technician   Supplement Source  --   --      ANTHROPOMETRIC  Height: 157.5 cm (5' 2)  Weight: 82.5 kg (181 lb 14.1 oz)  Body mass index is 33.27 kg/m.     ESTIMATED NEEDS  Total Daily Energy Needs: 1566 to 2001 kcal  Method for Calculating Energy Needs: 18 kcal - 23 kcal per kg  at 87 kg (Actual body weight)  Rationale: obese, noncritical, HD     Total Daily Protein Needs: 104.4 to 130.5 g  Method for Calculating Protein Needs: 2 g - 2.5 g per kg at 52.2 kg (Ideal body weight)  Rationale: obese, noncritical, HD    Total Daily Fluid Needs: 1044 to  1305 ml  Method for Calculating Fluid Needs: 20 ml - 25 ml  per kg at 52.2 kg (Ideal body weight)  Rationale: bmi, age    Pertinent Medications:   Scheduled Medications[1]  Infusion Meds[2]    PRN Meds given in the past 48 hours: no nutr related    Pertinent labs:  Recent Labs   Lab 08/04/22  0404 08/03/22  0637 08/02/22  0527 08/01/22  0320 07/31/22  0449   Sodium 141 140 139 140 136   Potassium 4.2 4.9 4.5 4.3 4.7   Chloride 103 102 101 103 99   CO2 25 22 23 25 24    BUN 25* 45* 34* 23* 43*   Creatinine 3.8* 5.8* 4.6* 3.2* 4.5*   Glucose 94 82 99 94 82   Calcium  8.4 8.8 8.5 8.4 8.6   Magnesium  1.8 1.9 1.9 2.0 1.9   Phosphorus 3.6 4.7 3.7 3.1 3.8   GFR 11.1* 6.7* 8.8* 13.7* 9.1*     Recent Labs   Lab 08/05/22  0751 08/04/22  2137 08/04/22  1527 08/04/22  1213 08/04/22  0741   Whole Blood Glucose POCT 94 125* 115* 164* 102*                                                      MONITORING/EVALUATION     Goals:     1. Pt will consume >75% of meals and ONS by follow up - active      Nutrition Risk Level: High (will follow up at least 2 times per week and PRN)      Mathews Stuhr S. Tobie MS, RD, CNSC  Clinical Dietitian       [1]   Current Facility-Administered Medications   Medication Dose    acetaminophen   650 mg    Or    acetaminophen   650 mg    amiodarone   200 mg    apixaban   2.5 mg    atorvastatin   40 mg    carvedilol   12.5 mg    darbepoetin alfa   0.45 mcg/kg    glycopyrrolate   0.2 mg    insulin  glargine  5 Units    insulin  lispro  1-3 Units    insulin  lispro  1-5 Units    lidocaine   20 mL    pantoprazole   40 mg    thiamine   100 mg    umeclidinium-vilanterol  1 puff    vitamins/minerals  1 tablet   [2]   Current Facility-Administered Medications   Medication Dose Route Frequency Last Rate

## 2022-08-05 NOTE — Discharge Summary (Signed)
 [facility]      Patient: Shelby Barron  Admission Date: 06/28/2022   DOB: [birth year]  Discharge Date: 08/05/2022    MRN: [id number]  Discharge Attending:Bettyann Birchler Elton, MD     Referring Physician: Freddrick Showman, MD  PCP: Freddrick Showman, MD       DISCHARGE SUMMARY     Discharge Information   Admission Diagnosis:   Bladder cancer    Discharge Diagnosis:   Active Hospital Problems    Diagnosis   . Anemia   . COPD (chronic obstructive pulmonary disease)   . Bladder cancer   . ESRD (end stage renal disease)        Malnutrition Documentation    Moderate Malnutrition related to inadequate nutritional intake in the setting of acute illness as evidenced by <50% EER > 5 days, mild muscle losses (temporalis, deltoid) - new                Discharge Condition: stable   Consultants: surgery, ID, cardiology, nephro, palliative, GI and neurology   Discharged to: [facility]       Hospital Course   Presentation History   Per admission H&P: Shelby Barron is a 83 y.o. female with history of COPD, [name], type 2 diabetes, renal cell carcinoma status post partial bilateral nephrectomy, [name], new diagnosis of urothelial cancer.       She was recently admitted in May 2024 at which time she was transitioned from PD to HD.  At that time was diagnosed with urothelial cancer.  She presented today for scheduled cystectomy and completion nephrectomy with urology.  Her last Plavix  dose was 6/3, her last dialysis was 6/8.  She had preop clearance with cardiology which included an echo.     In the OR course was complicated by  Small bowel enterotomy -General surgery called in enterotomy repaired with initial plan to put patient back into continuity     Later in the case significant bleeding during nephrectomy/cystectomy.  Approximately 1 L EBL.  Given significant bleeding decision made to leave patient in discontinuity and abdomen open with plans to return to the OR tomorrow.    Hospital Course (38 Days)     Acute toxic metabolic  Encephalopathy/delirium stable-improved  Possible underlying cognitive impairment  - On and off confusion alert oriented x 1-2, now relatively stable the last several days  - Patient was upgraded to ICU on 6/30 for lethargy and decreased responsiveness. CT head was with no acute finding.   - EEG consistent with encephalopathy no seizure, CVA management as below  - SLP advanced diet. NG tube removed 7/9.  - Palliative consulted:  Family was clear they would like no limits to care and patient remained full code.     Anemia of CKD and acute blood loss anemia  - TSAT 18%  - given Aranesp  this admission per nephrology  - Hgb now stable. No signs of active bleeding.     S/p open cystectomy, bilateral nephrectomies, and resection of small bowel after intra-op enterotomy    Mixed shock - hemorrhagic vs septic: Resolved  Necrotic wound tissue  - 6/10 small bowel enteretomy with 1L EBL. Went to the OR on June 12 for small bowel anastomosis, closure of mesenteric defect, closure of abdominal wall with wound VAC application. Wound VAC last changed on 7/15.   - Will need outpatient wound care follow up.  - Surgery consulted, CT abdomen with and without contrast did not show any anastomotic leak  or abscess.   - Status post wound debridement by general surgery at bedside on 7/15 for necrotic wound tissue  - ID consulted, completed meropenem  and Mycamine  for 2 weeks on 7/3. No further abx recommended after bedside debridement on 7/15     Acute CVA   Petechial hemorrhage vs cortical laminar necrosis  - Stopped ASA and started Eliquis  per neurology on 7/5 after >10 days after CVA. Continue statin therapy.  - Carotid Doppler showed plaque with no critical stenosis  - Head CT on 7/7 w/ a few areas of cortical hyperdensity - petechial hemorrhage vs cortical laminar necrosis. Discussed with neuro OK to continue eliquis  for now. Repeat head CT on 7/8 stable.  - EEG encephalopathic pattern no seizure, repeat MRI no new infarction  - PT OT  recommend SNF on discharge     Acute hypoxemic respiratory failure resolved  COPD  - Extubated on 6/13, currently on room air  - X-ray repeated on 6/29 showed resolution of interstitial edema     ESRD  S/p CRRT this admission, now on HD TTS  Continue dialysis on routine basis as outpatient  IR removed the nonfunctional permacath and right Quinton IJ cath and replaced with a new permacath on 6/26.     New onset Afib with RVR, improved  - CHA2DS2-VASc score is 7.   - Status post amiodarone  drip. Now stable on oral amiodarone  and coreg  per cardiology recommendations  - GI and general surgery cleared for anticoagulation. Started on Eliquis  since 07/23/22 with neurology clearance after CVA     Renal cell carcinoma outpatient follow-up hematology oncology  Urothelial carcinoma             Progress Note/Physical Exam at Discharge     Subjective:Patient is stable for discharge.    Vitals:    08/05/22 1300 08/05/22 1315 08/05/22 1326 08/05/22 1358   BP: 102/57 119/62 144/67 137/76   Pulse: 77 78 71 78   Resp:   18 18   Temp:   97.6 F (36.4 C) 97.7 F (36.5 C)   TempSrc:    Oral   SpO2:   100% 98%   Weight:       Height:           GEN APPEARANCE: Alert, awake and oriented x 2 (oriented to self and place)  HEENT: PERLA; EOMI; Conjunctiva Clear  NECK: Supple; No bruits  CVS: RRR, S1, S2; No M/G/R  LUNGS: CTAB; , no Rhonchi: No rales  ABD: Soft; No TTP; + Normoactive BS vertical abdominal wound and wound VAC in place  EXT: No edema; Pulses 2+ and intact  NEURO: moving right upper extremity spontaneously, minimal spont mvmt of LEs, no movement of left upper extremity       Diagnostics     Labs/Studies Pending at Discharge: No    Last Labs   Recent Labs   Lab 08/04/22  0404 08/03/22  0637 08/02/22  0527   WBC 11.91* 13.17* 13.08*   RBC 2.93* 3.02* 2.99*   Hemoglobin 8.7* 8.8* 8.8*   Hematocrit 26.7* 27.6* 27.1*   MCV 91.1 91.4 90.6   Platelet Count 263 266 270       Recent Labs   Lab 08/04/22  0404 08/03/22  0637 08/02/22  0527  08/01/22  0320 07/31/22  0449   Sodium 141 140 139 140 136   Potassium 4.2 4.9 4.5 4.3 4.7   Chloride 103 102 101 103 99   CO2 25 22 23  25 24   BUN 25* 45* 34* 23* 43*   Creatinine 3.8* 5.8* 4.6* 3.2* 4.5*   Glucose 94 82 99 94 82   Calcium  8.4 8.8 8.5 8.4 8.6   Magnesium  1.8 1.9 1.9 2.0 1.9       Microbiology Results (last 15 days)       ** No results found for the last 360 hours. **             Patient Instructions   Discharge Diet: renal diet heart healthy   Discharge Activity: as tolerated per PT    Follow Up Appointment:   Follow-up Information       [facility] [place]. Call.    Specialty: Gastroenterology  Why: please call to make f/u in 6 weeks.  Contact information:  [address] [place] [address] [place]  [address]  [phone]  Additional information:  Take [address] to [place], [place] [address] [place].  Turn right on [address] and then right again at [employer].  Destination will be on your right.               Drema Collin, MD Follow up.    Specialties: Nephrology, Internal Medicine  Contact information:  [address]  [place] [address]  [phone]               [facility] Follow up in 1 month(s).    Specialty: Cardiology  Contact information:  [address]  [place]  [address]  [phone]             Willard Magyar, MD Follow up in 1 week(s).    Specialty: Surgery  Contact information:  [address]  [place] [address]  [phone]                              Discharge Medications:     Medication List        START taking these medications      amiodarone  200 MG tablet  Commonly known as: PACERONE   Take 1 tablet (200 mg) by mouth daily This is a maintenance dose.     apixaban  2.5 MG  Commonly known as: ELIQUIS   Take 1 tablet (2.5 mg) by mouth every 12 (twelve) hours     carvedilol  12.5 MG tablet  Commonly known as: COREG   Take 1 tablet (12.5 mg) by mouth every 12  (twelve) hours     oxyCODONE  5 MG immediate release tablet  Commonly known as: ROXICODONE   Take 0.5 tablets (2.5 mg) by mouth every 8 (eight) hours as needed for Pain  Replaces: oxyCODONE  5 MG capsule     pantoprazole  40 mg/20 mL Susp  Commonly known as: PROTONIX   Take 20 mLs (40 mg) by mouth every 12 (twelve) hours  Replaces: pantoprazole  40 MG tablet            CONTINUE taking these medications      Anoro Ellipta  62.5-25 MCG/ACT Aepb  Generic drug: umeclidinium-vilanterol     atorvastatin  40 MG tablet  Commonly known as: LIPITOR     b complex vitamins capsule     cinacalcet  30 MG tablet  Commonly known as: SENSIPAR      conjugated estrogens vaginal cream  Commonly known as: PREMARIN     montelukast  10 MG tablet  Commonly known as: SINGULAIR   ondansetron  4 MG tablet  Commonly known as: Zofran   Take 1 tablet (4 mg) by mouth every 8 (eight) hours as needed for Nausea     oxybutynin  XL 5 MG 24 hr tablet  Commonly known as: DITROPAN -XL     sevelamer  carbonate 800 MG tablet  Commonly known as: RENVELA             STOP taking these medications      allopurinol  100 MG tablet  Commonly known as: ZYLOPRIM      AMBIEN  PO     clopidogrel  75 mg tablet  Commonly known as: PLAVIX      hyoscyamine  0.125 MG tablet  Commonly known as: LEVSIN      insulin  aspart 100 UNIT/ML injection  Commonly known as: NovoLOG      metoprolol  tartrate 25 MG tablet  Commonly known as: LOPRESSOR      oxyCODONE  5 MG capsule  Commonly known as: OXY-IR  Replaced by: oxyCODONE  5 MG immediate release tablet     pantoprazole  40 MG tablet  Commonly known as: PROTONIX   Replaced by: pantoprazole  40 mg/20 mL Susp     pregabalin  50 MG capsule  Commonly known as: LYRICA      torsemide 100 MG tablet  Commonly known as: DEMADEX     Tresiba  100 UNIT/ML Soln  Generic drug: Insulin  Degludec               Where to Get Your Medications        You can get these medications from any pharmacy    Bring a paper prescription for each of these medications  oxyCODONE  5 MG immediate  release tablet       Information about where to get these medications is not yet available    Ask your nurse or doctor about these medications  amiodarone  200 MG tablet  apixaban  2.5 MG  carvedilol  12.5 MG tablet  pantoprazole  40 mg/20 mL Susp          Time spent examining patient, discussing with patient/family regarding hospital course, chart review, reconciling medications and discharge planning: 45 minutes.    Signed,  Flint Duet, MD    2:22 PM 08/05/2022

## 2022-08-05 NOTE — OT Progress Note (Signed)
 Occupational Therapy Treatment  Shelby Barron        Post Acute Care Therapy Recommendations   Discharge Recommendations:  SNF    DME needs IF patient is discharging home: Barron bed, [place] [facility]ron, Wheelchair-manual    Therapy discharge recommendations may change with patient status.  Please refer to most recent note for up-to-date recommendations.    Unit: Shelby Barron [address] [facility]  Bed: [phone].[id number]    ___________________________________________________    Time of treatment:  OT Received On: 08/05/22  Start Time: 1740  Stop Time: 1810  Time Calculation (min): 30 min  Total Treatment Time (min): 15 ([place] w/ PT to maximize safety)    Chart Review and Collaboration with Care Team: 5 minutes, not included in above time.    OT Visit Number: 7      Precautions and Contraindications:    Precautions  Weight Bearing Status: no restrictions  Aspiration Precautions: see SLP recommendations  Fall Risks: High;Impaired balance/gait;Impaired mobility;History of fall(s);Muscle weakness;Mental status change  Seizure Precautions: Yes (comment date of last seizure)  Communication Precautions: Speak loudly;Speak slowly  Other Precautions: L hemiplegia, wound vac on abdomen, sacral wounds    Personal Protective Equipment (PPE)  gloves and procedure mask    Updated Labs:  Lab Results   Component Value Date/Time    HGB 8.7 (L) 08/04/2022 04:04 AM    HGB 8.6 (L) 06/03/2022 04:23 AM    HCT 26.7 (L) 08/04/2022 04:04 AM    HCT 26.8 (L) 06/03/2022 04:23 AM    K 4.2 08/04/2022 04:04 AM    K 3.6 06/03/2022 04:23 AM    NA 141 08/04/2022 04:04 AM    NA 137 06/03/2022 04:23 AM    INR 1.1 07/14/2022 04:35 AM    INR 1.1 06/01/2022 03:15 AM    TROPI 9.6 05/26/2022 06:37 PM    TROPI -2.2 05/26/2022 06:37 PM    TROPI 11.8 05/26/2022 03:26 PM       All imaging reviewed, please see chart for details.    Subjective: I don't want a blanket.          Patient's medical condition is appropriate for Occupational Therapy intervention at this  time.  Patient is agreeable to participation in the therapy session. Nursing clears patient for therapy.  Pain Assessment  Pain Assessment: No/denies pain      Objective:  Observation of Patient/Vital Signs:  Stable    Cognition/Neuro Status  Arousal/Alertness: Appropriate responses to stimuli  Attention Span: Attends to task with redirection  Orientation Level: Oriented to person;Disoriented to place;Disoriented to time;Disoriented to situation  Memory: Decreased short term memory;Decreased recall of recent events  Following Commands: Follows one step commands with repetition;Follows one step commands with increased time  Safety Awareness: moderate verbal instruction  Insights: Educated in safety awareness  Problem Solving: maximum assistance  Behavior: calm;flat affect  Motor Planning: decreased initiation;decreased processing speed  Coordination: FMC impaired;GMC impaired    Functional Mobility  Scooting Transfers: Maximal Assist;additional time (X2)  Supine to Sit Transfers: Maximal Assist;additional time (X2)  Sit to Supine Transfers: Maximal Assist;additional time (X2)  Sit to Stand Transfers: not tested    Self-care and Home Management  Grooming: Maximal Assist;wash/dry face;in bed;brushing hair  LB Dressing: Dependent;Don/doff R sock;Don/doff L sock                                    Educated the Patient to  role of occupational therapy, plan of care, goals of therapy and safety with mobility and ADLs, energy conservation techniques with verbalized understanding  and demonstrated understanding.     Patient left in bed with alarm and all other medical equipment in place and call bell and all personal items/needs within reach.  RN notified of session outcome.         Assessment: Pt is progressing towards OT goals. Main limitations are decreased activity endurance, decreased functional mobility, weakness and fatigue limiting participation in functional mobility and ADL tasks. Pt has demonstrated an increase in  alertness and sitting balance, Pt would benefit from continued OT services in order to maximize potential. Therapist continues to recommend [name] to SNF.                 PMP Activity: Step 4 - Dangle at Bedside  Distance Walked (ft) (Step 6,7): 0 Feet      Plan:  OT Frequency Recommended: 2-3x/wk  Goal Formulation: Patient      Time For Goal Achievement: by time of discharge  ADL Goals  Patient will groom self: Moderate Assist, Not met     Neuro Re-Ed Goals  Pt will sit at edge of bed: Maximal Assist, for 3 minutes, Partly met  Other Goal: Pt will follow one step v commands 50% of the time  Musculoskeletal Goals  Pt will perform Home Exercise Program: maximal assist, with caregiver/family assist, Goal met  Executive Fucntion Goals  Pt will demonstrate attention to task: with minimal assist, to increase ability to complete ADLs, Not met  Other Goal: Pt will be oriented X 3 75% of the time; not met              Continue plan of care.      Shelby Barron, COTA/L    Physical Medicine and Rehabilitation  Shelby Barron  [phone]    08/05/2022  6:31 PM    Shelby Barron  Patient: Shelby Barron MRN#: [id number]   Unit: Shelby Barron [address] [facility] Bed: [phone]

## 2022-08-05 NOTE — Progress Notes (Signed)
 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 s/p bilateral radical nephrectomies and  cystectomy on 06/28/22  - complicated by small bowel enterotomy and blood loss   New onset afib - on amiodarone  and anticoagulation  High grade urothelial bladder CA: s/p bilateral nephrectomies and cystectomy c/b mall bowel enterotomy and blood loss (~1L) on 06/28/22  - awaiting wound debridement   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 - remains low, adequate iron  - on aranesp   Encephalopathy - better  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 Speaking some but flat affect    Review of Systems:   No SOB    Physical Exam:     Vitals:    08/04/22 1528 08/04/22 2000 08/04/22 2348 08/05/22 0507   BP: 161/61 143/65 130/78 161/64   Pulse: 73 74 70 76   Resp: 14 17 18 19    Temp: 99.1 F (37.3 C) 98.6 F (37 C) 97.5 F (36.4 C)    TempSrc: Oral Oral Oral    SpO2: 98% 100% 100% 96%   Weight:       Height:           Intake and Output Summary (Last 24 hours) at Date Time    Intake/Output Summary (Last 24 hours) at 08/05/2022 0520  Last data filed at 08/05/2022 0200  Gross per 24 hour   Intake 320 ml   Output 0 ml   Net 320 ml         General: resting   Cardiovascular: regular rate and rhythm  Lungs: bilateral air  entry  Abdomen: soft  Extremities: no edema  Access: R permacath     Meds:      Scheduled Meds: PRN Meds:    acetaminophen , 650 mg, Oral, 4 times per day   Or  acetaminophen , 650 mg, Oral, 4 times per day  amiodarone , 200 mg, per NG tube, Daily  apixaban , 2.5 mg, Oral, Q12H SCH  atorvastatin , 40 mg, Oral, QHS  carvedilol , 12.5 mg, Oral, Q12H SCH  darbepoetin alfa , 0.45 mcg/kg, Subcutaneous, Weekly  glycopyrrolate , 0.2 mg, Intravenous, Q8H  insulin  glargine, 5 Units, Subcutaneous, QAM  insulin  lispro, 1-3 Units, Subcutaneous, QHS  insulin  lispro, 1-5 Units, Subcutaneous, TID AC  lidocaine , 20 mL, Intradermal, Once  pantoprazole , 40 mg, per NG tube, Q12H [facility]  thiamine , 100 mg, per NG tube, Daily  umeclidinium-vilanterol, 1 puff, Inhalation, QAM  vitamins/minerals, 1 tablet, Oral, Daily          Continuous Infusions:         sodium chloride , , PRN  acetaminophen , 650 mg, Q6H PRN  albuterol -ipratropium, 3 mL, Q6H PRN  benzocaine ,  1 spray, TID PRN  dextrose , 15 g of glucose, PRN   Or  dextrose , 12.5 g, PRN   Or  dextrose , 12.5 g, PRN   Or  glucagon  (rDNA), 1 mg, PRN  diphenhydrAMINE , 25 mg, Q6H PRN  hydrALAZINE , 10 mg, Q3H PRN  HYDROmorphone , 1 mg, Q4H PRN  labetalol , 10 mg, Q15 Min PRN  lidocaine  2% jelly, , Once PRN  naloxone , 0.2 mg, PRN  ondansetron , 4 mg, Q6H PRN  oxyCODONE , 2.5 mg, Q4H PRN  phenol, 1 spray, Q2H PRN              Labs:     Recent Labs   Lab 08/04/22  0404 08/03/22  0637 08/02/22  0527   WBC 11.91* 13.17* 13.08*   Hemoglobin 8.7* 8.8* 8.8*   Hematocrit 26.7* 27.6* 27.1*   Platelet Count 263 266 270     Recent Labs   Lab 08/04/22  0404 08/03/22  0637 08/02/22  0527   Sodium 141 140 139   Potassium 4.2 4.9 4.5   Chloride 103 102 101   CO2 25 22 23    BUN 25* 45* 34*   Creatinine 3.8* 5.8* 4.6*   Calcium  8.4 8.8 8.5   Albumin  1.6* 1.6* 1.6*   Phosphorus 3.6 4.7 3.7   Magnesium  1.8 1.9 1.9   Glucose 94 82 99   GFR 11.1* 6.7* 8.8*           Signed by: Terral Medicine, MD

## 2022-08-06 LAB — CBC
Absolute nRBC: 0 10*3/uL (ref <=0.00)
Hematocrit: 28.9 % — ABNORMAL LOW (ref 34.7–43.7)
Hemoglobin: 8.9 g/dL — ABNORMAL LOW (ref 11.4–14.8)
MCH: 28.5 pg (ref 25.1–33.5)
MCHC: 30.8 g/dL — ABNORMAL LOW (ref 31.5–35.8)
MCV: 92.6 fL (ref 78.0–96.0)
MPV: 10 fL (ref 8.9–12.5)
Platelet Count: 244 10*3/uL (ref 142–346)
RBC: 3.12 10*6/uL — ABNORMAL LOW (ref 3.90–5.10)
RDW: 15 % (ref 11–15)
WBC: 13.91 10*3/uL — ABNORMAL HIGH (ref 3.10–9.50)
nRBC %: 0 /100 WBC (ref <=0.0)

## 2022-08-06 LAB — COMPREHENSIVE METABOLIC PANEL
ALT: 9 U/L (ref 0–55)
AST (SGOT): 11 U/L (ref 5–41)
Albumin/Globulin Ratio: 0.5 — ABNORMAL LOW (ref 0.9–2.2)
Albumin: 1.6 g/dL — ABNORMAL LOW (ref 3.5–5.0)
Alkaline Phosphatase: 96 U/L (ref 37–117)
Anion Gap: 11 (ref 5.0–15.0)
BUN: 27 mg/dL — ABNORMAL HIGH (ref 7–21)
Bilirubin, Total: 0.3 mg/dL (ref 0.2–1.2)
CO2: 27 mEq/L (ref 17–29)
Calcium: 8.4 mg/dL (ref 7.9–10.2)
Chloride: 103 mEq/L (ref 99–111)
Creatinine: 4.1 mg/dL — ABNORMAL HIGH (ref 0.4–1.0)
GFR: 10.2 mL/min/{1.73_m2} — ABNORMAL LOW (ref >=60.0)
Globulin: 3.1 g/dL (ref 2.0–3.6)
Glucose: 96 mg/dL (ref 70–100)
Potassium: 4.3 mEq/L (ref 3.5–5.3)
Protein, Total: 4.7 g/dL — ABNORMAL LOW (ref 6.0–8.3)
Sodium: 141 mEq/L (ref 135–145)

## 2022-08-06 LAB — WHOLE BLOOD GLUCOSE POCT
Whole Blood Glucose POCT: 110 mg/dL — ABNORMAL HIGH (ref 70–100)
Whole Blood Glucose POCT: 162 mg/dL — ABNORMAL HIGH (ref 70–100)
Whole Blood Glucose POCT: 98 mg/dL (ref 70–100)
Whole Blood Glucose POCT: 141 mg/dL — ABNORMAL HIGH (ref 70–100)

## 2022-08-06 MED ORDER — CALCIUM CARBONATE ANTACID 500 MG PO CHEW
500.0000 mg | CHEWABLE_TABLET | ORAL | Status: DC | PRN
Start: 2022-08-06 — End: 2022-08-18
  Administered 2022-08-06 – 2022-08-17 (×3): 500 mg via ORAL
  Filled 2022-08-06 (×3): qty 1

## 2022-08-06 NOTE — Progress Notes (Addendum)
D/C Delay pending Insurance Auth    CM made call to [facility] to inquire on the status of the [employer]. Per Liaison, the Berkley Harvey is still pending.  Covering CM may follow up with Weekend Staff to inquire on the [employer].  CM will continue to follow.    Family is requesting pt transport with [employer]. Contact person Marya Landry at [phone].  Company is aware and awaiting call for pt's pick up.    Real Cons Lerae Langham MSW, [employer]  Care Manager   [facility]  [phone]

## 2022-08-06 NOTE — Plan of Care (Signed)
Problem: Moderate/High Fall Risk Score >5  Goal: Patient will remain free of falls  Outcome: Progressing  Flowsheets  Taken 08/05/2022 0800 by Wess Botts, RN  High (Greater than 13):   HIGH-Consider use of low bed   HIGH-Initiate use of floor mats as appropriate  Taken 07/31/2022 0618 by Connye Burkitt, RN  VH Moderate Risk (6-13): YELLOW "FALL RISK" ARM BAND  Taken 07/29/2022 1755 by Lorriane Shire, RN  VH High Risk (Greater than 13):   RED "HIGH FALL RISK" SIGNAGE   BED ALARM WILL BE ACTIVATED WHEN THE PATEINT IS IN BED WITH SIGNAGE "RESET BED ALARM"   A CHAIR PAD ALARM WILL BE USED WHEN PATIENT IS UP SITTING IN A CHAIR   A safety companion may be used when deemed appropriate by the Primary RN and Clinical Administrator   PATIENT IS TO BE SUPERVISED FOR ALL TOILETING ACTIVITIES   Keep door open for better visibility   Include family/significant other in multidisciplinary discussion regarding plan of care as appropriate   Request PT/OT therapy consult order from physician for patients with gait/mobility impairment   Use assistive devices   Use chair-pad alarm device   Use of floor mat   Use of roll guard  Taken 07/27/2022 0800 by Lavina Hamman, RN  Moderate Risk (6-13): MOD-Consider activation of bed alarm if appropriate     Problem: Compromised Activity/Mobility  Goal: Activity/Mobility Interventions  Outcome: Progressing  Flowsheets (Taken 08/05/2022 2120)  Activity/Mobility Interventions: Pad bony prominences, TAP Seated positioning system when OOB, Promote PMP, Reposition q 2 hrs / turn clock, Offload heels     Problem: Impaired Mobility  Goal: Mobility/Activity is maintained at optimal level for patient  Outcome: Progressing  Flowsheets (Taken 08/05/2022 1255 by Wess Botts, RN)  Mobility/activity is maintained at optimal level for patient:   Encourage independent activity per ability   Consult/collaborate with Physical Therapy and/or Occupational Therapy     Problem: Constipation  Goal: Fluid and  electrolyte balance are achieved/maintained  Outcome: Progressing  Flowsheets (Taken 08/05/2022 1255 by Wess Botts, RN)  Fluid and electrolyte balance are achieved/maintained:   Monitor/assess lab values and report abnormal values   Assess and reassess fluid and electrolyte status   Observe for cardiac arrhythmias   Monitor for muscle weakness     Problem: Safety  Goal: Patient will be free from injury during hospitalization  Outcome: Progressing  Flowsheets (Taken 08/05/2022 1255 by Wess Botts, RN)  Patient will be free from injury during hospitalization:   Assess patient's risk for falls and implement fall prevention plan of care per policy   Use appropriate transfer methods   Provide and maintain safe environment   Include patient/ family/ care giver in decisions related to safety   Ensure appropriate safety devices are available at the bedside   Hourly rounding   Assess for patients risk for elopement and implement Elopement Risk Plan per policy   Provide alternative method of communication if needed (communication boards, writing)     Problem: Hemodynamic Status: Cardiac  Goal: Stable vital signs and fluid balance  Outcome: Progressing  Flowsheets (Taken 08/05/2022 1255 by Wess Botts, RN)  Stable vital signs and fluid balance:   Assess signs and symptoms associated with cardiac rhythm changes   Monitor lab values     Problem: Fluid and Electrolyte Imbalance/ Endocrine  Goal: Fluid and electrolyte balance are achieved/maintained  Outcome: Progressing  Flowsheets (Taken 08/05/2022 1255 by Wess Botts, RN)  Fluid and electrolyte balance are achieved/maintained:  Monitor/assess lab values and report abnormal values   Assess and reassess fluid and electrolyte status   Observe for cardiac arrhythmias   Monitor for muscle weakness     Problem: Neurological Deficit  Goal: Neurological status is stable or improving  Outcome: Progressing  Flowsheets (Taken 07/29/2022 1755 by Lorriane Shire, RN)  Neurological  status is stable or improving:   Monitor/assess/document neurological assessment (Stroke: every 4 hours)   Monitor/assess NIH Stroke Scale   Observe for seizure activity and initiate seizure precautions if indicated   Perform CAM Assessment   Re-assess NIH Stroke Scale for any change in status     Problem: Every Day - Stroke  Goal: Mobility/Activity is maintained at optimal level for patient  Outcome: Progressing  Flowsheets (Taken 08/05/2022 1255 by Wess Botts, RN)  Mobility/activity is maintained at optimal level for patient:   Encourage independent activity per ability   Consult/collaborate with Physical Therapy and/or Occupational Therapy  Goal: Neurological status is stable or improving  Outcome: Progressing  Flowsheets (Taken 07/29/2022 1755 by Lorriane Shire, RN)  Neurological status is stable or improving:   Monitor/assess/document neurological assessment (Stroke: every 4 hours)   Monitor/assess NIH Stroke Scale   Observe for seizure activity and initiate seizure precautions if indicated   Perform CAM Assessment   Re-assess NIH Stroke Scale for any change in status     Problem: Day of Discharge - Stroke  Goal: Able to express understanding of discharge instructions and stroke education  Outcome: Progressing  Flowsheets (Taken 08/06/2022 0157)  Able to express understanding of discharge instructions and stroke education:   Provide alternative method of communication if needed   Include patient care companion in decisions related to communication

## 2022-08-06 NOTE — Progress Notes (Signed)
CM received call from [facility] [phone] stating Insurance is requesting a P2P by Monday 08/09/22 before 12 noon.  Contact number is [phone] with [id number].  CM provided notice to MD and will continue to coordinate all d/c needs.    Real Cons Levia Waltermire MSW, [facility]  Care Manager   [facility]  [phone]

## 2022-08-06 NOTE — Progress Notes (Signed)
Infectious Diseases & Tropical Medicine  Progress Note    08/06/2022   Shelby Barron ZOX:[id number] is a 82 y.o. female,       Assessment:     Encephalopathy  Acute CVA  History of bladder cancer and renal failure  Status post radical cystectomy/bilateral nephrectomy, enterectomy and lysis of adhesion (06/28/2022)  S/p small bowel resection  Abdominal wall wound s/p bedside debridement/wound VAC placement (08/02/2022)  End-stage renal disease on hemodialysis  Atrial fibrillation  Risk of aspiration  Mild leukocytosis    Plan:     Continue monitoring without antibiotics  Wound care nurse following  Follow-up chest x-ray  Continue probiotics  Monitor electrolytes and renal functions closely  Monitor clinically    ROS:     General: No fever, awake, confused  Respiratory: No cough or shortness of breath  Gastrointestinal: No abdominal pain  Genito-Urinary: no hematuria  Musculoskeletal: no edema  Neurological: awake  Dermatological: No rash    Physical Examination:     Blood pressure 131/68, pulse 81, temperature 99 F (37.2 C), temperature source Oral, resp. rate 16, height 1.575 m (5\' 2" ), weight 83.1 kg (183 lb 3.2 oz), SpO2 99%.    General Appearance: Comfortable  HEENT: Pupils are equal, round, and reactive to light.   Lungs: Clear to auscultation  Heart: Regular rate and rhythm  Chest: Symmetric chest wall expansion.   Abdomen: soft, no hepatosplenomegaly, no bowel sounds, abdominal wall wound VAC in place  Neurological: awake  Extremities: No edema    Laboratory And Diagnostic Studies:     Recent Labs     08/04/22  0404   WBC 11.91*   Hemoglobin 8.7*   Hematocrit 26.7*   Platelet Count 263     Recent Labs     08/04/22  0404   Sodium 141   Potassium 4.2   Chloride 103   CO2 25   BUN 25*   Creatinine 3.8*   Glucose 94   Calcium 8.4     Recent Labs     08/04/22  0404   AST (SGOT) 13   ALT 9   Alkaline Phosphatase 94   Protein, Total 4.4*   Albumin 1.6*         Current Meds:      Scheduled Meds: PRN  Meds:    acetaminophen, 650 mg, Oral, 4 times per day   Or  acetaminophen, 650 mg, Oral, 4 times per day  amiodarone, 200 mg, per NG tube, Daily  apixaban, 2.5 mg, Oral, Q12H SCH  atorvastatin, 40 mg, Oral, QHS  b complex-vitamin c-folic acid, 1 tablet, Oral, Daily  carvedilol, 12.5 mg, Oral, Q12H SCH  darbepoetin alfa, 0.45 mcg/kg, Subcutaneous, Weekly  glycopyrrolate, 0.2 mg, Intravenous, Q8H  insulin lispro, 1-3 Units, Subcutaneous, QHS  insulin lispro, 1-5 Units, Subcutaneous, TID AC  lidocaine, 20 mL, Intradermal, Once  pantoprazole, 40 mg, per NG tube, Q12H SCH  thiamine, 100 mg, per NG tube, Daily  umeclidinium-vilanterol, 1 puff, Inhalation, QAM        Continuous Infusions:       sodium chloride, , PRN  acetaminophen, 650 mg, Q6H PRN  albuterol-ipratropium, 3 mL, Q6H PRN  benzocaine, 1 spray, TID PRN  calcium carbonate, 500 mg, Q4H PRN  dextrose, 15 g of glucose, PRN   Or  dextrose, 12.5 g, PRN   Or  dextrose, 12.5 g, PRN   Or  glucagon (rDNA), 1 mg, PRN  diphenhydrAMINE, 25 mg, Q6H PRN  hydrALAZINE, 10 mg, Q3H  PRN  HYDROmorphone, 1 mg, Q4H PRN  labetalol, 10 mg, Q15 Min PRN  lidocaine 2% jelly, , Once PRN  naloxone, 0.2 mg, PRN  ondansetron, 4 mg, Q6H PRN  oxyCODONE, 2.5 mg, Q4H PRN  phenol, 1 spray, Q2H PRN          Quadir Muns A. Janalyn Rouse, M.D.  08/06/2022  9:52 AM

## 2022-08-06 NOTE — Discharge Summary (Signed)
[facility]      Patient: Shelby Barron  Admission Date: 06/28/2022   DOB: 05/09/1940  Discharge Date: 08/06/2022    MRN: [id number]  Discharge Attending:Shaneka Efaw Laurine Blazer, MD     Referring Physician: Patsy Lager, MD  PCP: Patsy Lager, MD       DISCHARGE SUMMARY     Discharge Information   Admission Diagnosis:   Bladder cancer    Discharge Diagnosis:   Active Hospital Problems    Diagnosis    Anemia    COPD (chronic obstructive pulmonary disease)    Bladder cancer    ESRD (end stage renal disease)        Malnutrition Documentation    Moderate Malnutrition related to inadequate nutritional intake in the setting of acute illness as evidenced by <50% EER > 5 days, mild muscle losses (temporalis, deltoid) - new                Discharge Condition: stable   Consultants: surgery, ID, cardiology, nephro, palliative, GI and neurology   Discharged to: [facility]       Hospital Course   Presentation History   Per admission H&P: Shelby Barron is a 82 y.o. female with history of COPD, [name], type 2 diabetes, renal cell carcinoma status post partial bilateral nephrectomy, [name], new diagnosis of urothelial cancer.       She was recently admitted in May 2024 at which time she was transitioned from PD to HD.  At that time was diagnosed with urothelial cancer.  She presented today for scheduled cystectomy and completion nephrectomy with urology.  Her last Plavix dose was 6/3, her last dialysis was 6/8.  She had preop clearance with cardiology which included an echo.     In the OR course was complicated by  Small bowel enterotomy -General surgery called in enterotomy repaired with initial plan to put patient back into continuity     Later in the case significant bleeding during nephrectomy/cystectomy.  Approximately 1 L EBL.  Given significant bleeding decision made to leave patient in discontinuity and abdomen open with plans to return to the OR tomorrow.    Hospital Course (39 Days)     Acute toxic metabolic  Encephalopathy/delirium stable-improved  Possible underlying cognitive impairment  - On and off confusion alert oriented x 1-2, now relatively stable the last several days  - Patient was upgraded to ICU on 6/30 for lethargy and decreased responsiveness. CT head was with no acute finding.   - EEG consistent with encephalopathy no seizure, CVA management as below  - SLP advanced diet. NG tube removed 7/9.  - Palliative consulted:  Family was clear they would like no limits to care and patient remained full code.     Anemia of CKD and acute blood loss anemia  - TSAT 18%  - given Aranesp this admission per nephrology  - Hgb now stable. No signs of active bleeding.     S/p open cystectomy, bilateral nephrectomies, and resection of small bowel after intra-op enterotomy    Mixed shock - hemorrhagic vs septic: Resolved  Necrotic wound tissue  - 6/10 small bowel enteretomy with 1L EBL. Went to the OR on June 12 for small bowel anastomosis, closure of mesenteric defect, closure of abdominal wall with wound VAC application. Wound VAC last changed on 7/15.   - Will need outpatient wound care follow up.  - Surgery consulted, CT abdomen with and without contrast did not show any anastomotic leak  or abscess.   - Status post wound debridement by general surgery at bedside on 7/15 for necrotic wound tissue  - ID consulted, completed meropenem and Mycamine for 2 weeks on 7/3. No further abx recommended after bedside debridement on 7/15     Acute CVA   Petechial hemorrhage vs cortical laminar necrosis  - Stopped ASA and started Eliquis per neurology on 7/5 after >10 days after CVA. Continue statin therapy.  - Carotid Doppler showed plaque with no critical stenosis  - Head CT on 7/7 w/ a few areas of cortical hyperdensity - petechial hemorrhage vs cortical laminar necrosis. Discussed with neuro OK to continue eliquis for now. Repeat head CT on 7/8 stable.  - EEG encephalopathic pattern no seizure, repeat MRI no new infarction  - PT OT  recommend SNF on discharge     Acute hypoxemic respiratory failure resolved  COPD  - Extubated on 6/13, currently on room air  - X-ray repeated on 6/29 showed resolution of interstitial edema     ESRD  S/p CRRT this admission, now on HD TTS  Continue dialysis on routine basis as outpatient  IR removed the nonfunctional permacath and right Quinton IJ cath and replaced with a new permacath on 6/26.     New onset Afib with RVR, improved  - CHA2DS2-VASc score is 7.   - Status post amiodarone drip. Now stable on oral amiodarone and coreg per cardiology recommendations  - GI and general surgery cleared for anticoagulation. Started on Eliquis since 07/23/22 with neurology clearance after CVA     Renal cell carcinoma outpatient follow-up hematology oncology  Urothelial carcinoma             Progress Note/Physical Exam at Discharge     Subjective:Patient is stable for discharge.    Vitals:    08/06/22 0456 08/06/22 0459 08/06/22 0745 08/06/22 1144   BP: 106/55 106/55 131/68 118/68   Pulse: 75 75 81 94   Resp: 16  16 18    Temp: 98.2 F (36.8 C)  99 F (37.2 C) 98.1 F (36.7 C)   TempSrc: Oral  Oral Oral   SpO2: 93%  99% 93%   Weight:   83.1 kg (183 lb 3.2 oz)    Height:           GEN APPEARANCE: Alert, awake and oriented x 2 (oriented to self and place)  HEENT: PERLA; EOMI; Conjunctiva Clear  NECK: Supple; No bruits  CVS: RRR, S1, S2; No M/G/R  LUNGS: CTAB; , no Rhonchi: No rales  ABD: Soft; No TTP; + Normoactive BS vertical abdominal wound and wound VAC in place  EXT: No edema; Pulses 2+ and intact  NEURO: moving right upper extremity spontaneously, minimal spont mvmt of LEs, no movement of left upper extremity       Diagnostics     Labs/Studies Pending at Discharge: No    Last Labs   Recent Labs   Lab 08/06/22  0504 08/04/22  0404 08/03/22  0637   WBC 13.91* 11.91* 13.17*   RBC 3.12* 2.93* 3.02*   Hemoglobin 8.9* 8.7* 8.8*   Hematocrit 28.9* 26.7* 27.6*   MCV 92.6 91.1 91.4   Platelet Count 244 263 266       Recent Labs    Lab 08/06/22  0504 08/04/22  0404 08/03/22  0637 08/02/22  0527 08/01/22  0320 07/31/22  0449   Sodium 141 141 140 139 140 136   Potassium 4.3 4.2 4.9 4.5 4.3 4.7  Chloride 103 103 102 101 103 99   CO2 27 25 22 23 25 24    BUN 27* 25* 45* 34* 23* 43*   Creatinine 4.1* 3.8* 5.8* 4.6* 3.2* 4.5*   Glucose 96 94 82 99 94 82   Calcium 8.4 8.4 8.8 8.5 8.4 8.6   Magnesium  --  1.8 1.9 1.9 2.0 1.9       Microbiology Results (last 15 days)       ** No results found for the last 360 hours. **             Patient Instructions   Discharge Diet: renal diet heart healthy   Discharge Activity: as tolerated per PT    Follow Up Appointment:   Follow-up Information       [facility] [place]. Call.    Specialty: Gastroenterology  Why: please call to make f/u in 6 weeks.  Contact information:  [address]  [place] [address]  [phone]  Additional information:  Take [address] to [place], [place] [address] [place].  Turn right on [address] and then right again at [employer].  Destination will be on your right.               Alla German, MD Follow up.    Specialties: Nephrology, Internal Medicine  Contact information:  [address]  [place] [address]  [phone]               [facility] Follow up in 1 month(s).    Specialty: Cardiology  Contact information:  [address]  [place] [address]  [phone]             Marlaine Hind, MD Follow up in 1 week(s).    Specialty: Surgery  Contact information:  [address]  [place] [address]  [phone]               [facility] Follow up in 1 week(s).    Contact information:  [address]  [address]  [place] [address]                            Discharge Medications:     Medication List        START taking these medications      amiodarone 200 MG tablet  Commonly known as: PACERONE  Take 1 tablet (200 mg)  by mouth daily This is a maintenance dose.     apixaban 2.5 MG  Commonly known as: ELIQUIS  Take 1 tablet (2.5 mg) by mouth every 12 (twelve) hours     carvedilol 12.5 MG tablet  Commonly known as: COREG  Take 1 tablet (12.5 mg) by mouth every 12 (twelve) hours     oxyCODONE 5 MG immediate release tablet  Commonly known as: ROXICODONE  Take 0.5 tablets (2.5 mg) by mouth every 8 (eight) hours as needed for Pain  Replaces: oxyCODONE 5 MG capsule     pantoprazole 40 mg/20 mL Susp  Commonly known as: PROTONIX  Take 20 mLs (40 mg) by mouth every 12 (twelve) hours  Replaces: pantoprazole 40 MG tablet            CONTINUE taking these medications      Anoro Ellipta 62.5-25 MCG/ACT Aepb  Generic drug: umeclidinium-vilanterol     atorvastatin 40  MG tablet  Commonly known as: LIPITOR     b complex vitamins capsule     cinacalcet 30 MG tablet  Commonly known as: SENSIPAR     conjugated estrogens vaginal cream  Commonly known as: PREMARIN     montelukast 10 MG tablet  Commonly known as: SINGULAIR     ondansetron 4 MG tablet  Commonly known as: Zofran  Take 1 tablet (4 mg) by mouth every 8 (eight) hours as needed for Nausea     oxybutynin XL 5 MG 24 hr tablet  Commonly known as: DITROPAN-XL     sevelamer carbonate 800 MG tablet  Commonly known as: RENVELA            STOP taking these medications      allopurinol 100 MG tablet  Commonly known as: ZYLOPRIM     AMBIEN PO     clopidogrel 75 mg tablet  Commonly known as: PLAVIX     hyoscyamine 0.125 MG tablet  Commonly known as: LEVSIN     insulin aspart 100 UNIT/ML injection  Commonly known as: NovoLOG     metoprolol tartrate 25 MG tablet  Commonly known as: LOPRESSOR     oxyCODONE 5 MG capsule  Commonly known as: OXY-IR  Replaced by: oxyCODONE 5 MG immediate release tablet     pantoprazole 40 MG tablet  Commonly known as: PROTONIX  Replaced by: pantoprazole 40 mg/20 mL Susp     pregabalin 50 MG capsule  Commonly known as: LYRICA     torsemide 100 MG tablet  Commonly known as:  DEMADEX     Tresiba 100 UNIT/ML Soln  Generic drug: Insulin Degludec               Where to Get Your Medications        You can get these medications from any pharmacy    Bring a paper prescription for each of these medications  oxyCODONE 5 MG immediate release tablet       Information about where to get these medications is not yet available    Ask your nurse or doctor about these medications  amiodarone 200 MG tablet  apixaban 2.5 MG  carvedilol 12.5 MG tablet  pantoprazole 40 mg/20 mL Susp          Time spent examining patient, discussing with patient/family regarding hospital course, chart review, reconciling medications and discharge planning: 45 minutes.    Signed,  Lennox Pippins, MD    1:21 PM 08/06/2022

## 2022-08-06 NOTE — Progress Notes (Signed)
4 eyes in 4 hours pressure injury assessment note:      Completed with: [place]  Unit & Time admitted:  23, 2100            Bony Prominences: Check appropriate box; if wound is present enter wound assessment in LDA     Occiput:                 [x] WNL  []  Wound present  Face:                     [x] WNL  []  Wound present  Ears:                      [x] WNL  []  Wound present  Spine:                    [x] WNL  []  Wound present  Shoulders:             [x] WNL  []  Wound present  Elbows:                  [x] WNL  []  Wound present  Sacrum/coccyx:     [] WNL  [x]  Wound present  Ischial Tuberosity:  [] WNL  [x]  Wound present  Trochanter/Hip:      [x] WNL  []  Wound present  Knees:                   [x] WNL  []  Wound present  Ankles:                   [x] WNL  []  Wound present  Heels:                    [x] WNL  []  Wound present  Other pressure areas:  []  Wound location       Device related: [x]  Device name: Wound vac, abdomen        LDA completed if wound present: yes/no  Consult WOCN if necessary    Other skin related issues, ie tears, rash, etc, document in Integumentary flowsheet

## 2022-08-06 NOTE — Progress Notes (Signed)
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 s/p bilateral radical nephrectomies and  cystectomy on 06/28/22  - complicated by small bowel enterotomy and blood loss   New onset afib - on amiodarone and anticoagulation  High grade urothelial bladder CA: s/p bilateral nephrectomies and cystectomy c/b mall bowel enterotomy and blood loss (~1L) on 06/28/22  - awaiting wound debridement   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 - remains low, adequate iron - on aranesp  Encephalopathy - better  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8 C)  99 F (37.2 C) 98.1 F (36.7 C)   TempSrc: Oral  Oral Oral   SpO2: 93%  99% 93%   Weight:   83.1 kg (183 lb 3.2 oz)    Height:           Intake and Output Summary (Last 24 hours) at Date Time    Intake/Output Summary (Last 24 hours) at 08/06/2022 1415  Last data filed at 08/06/2022 1410  Gross per 24 hour   Intake 630 ml   Output 0 ml    Net 630 ml         General: awake, alert   Cardiovascular: regular rate and rhythm  Lungs: bilateral air entry  Abdomen: soft  Extremities: no edema  Access: R permacath     Meds:      Scheduled Meds: PRN Meds:    acetaminophen, 650 mg, Oral, 4 times per day   Or  acetaminophen, 650 mg, Oral, 4 times per day  amiodarone, 200 mg, per NG tube, Daily  apixaban, 2.5 mg, Oral, Q12H SCH  atorvastatin, 40 mg, Oral, QHS  b complex-vitamin c-folic acid, 1 tablet, Oral, Daily  carvedilol, 12.5 mg, Oral, Q12H SCH  darbepoetin alfa, 0.45 mcg/kg, Subcutaneous, Weekly  glycopyrrolate, 0.2 mg, Intravenous, Q8H  insulin lispro, 1-3 Units, Subcutaneous, QHS  insulin lispro, 1-5 Units, Subcutaneous, TID AC  lidocaine, 20 mL, Intradermal, Once  pantoprazole, 40 mg, per NG tube, Q12H SCH  thiamine, 100 mg, per NG tube, Daily  umeclidinium-vilanterol, 1 puff, Inhalation, QAM          Continuous Infusions:  sodium chloride, , PRN  acetaminophen, 650 mg, Q6H PRN  albuterol-ipratropium, 3 mL, Q6H PRN  benzocaine, 1 spray, TID PRN  calcium carbonate, 500 mg, Q4H PRN  dextrose, 15 g of glucose, PRN   Or  dextrose, 12.5 g, PRN   Or  dextrose, 12.5 g, PRN   Or  glucagon (rDNA), 1 mg, PRN  diphenhydrAMINE, 25 mg, Q6H PRN  hydrALAZINE, 10 mg, Q3H PRN  HYDROmorphone, 1 mg, Q4H PRN  labetalol, 10 mg, Q15 Min PRN  lidocaine 2% jelly, , Once PRN  naloxone, 0.2 mg, PRN  ondansetron, 4 mg, Q6H PRN  oxyCODONE, 2.5 mg, Q4H PRN  phenol, 1 spray, Q2H PRN              Labs:     Recent Labs   Lab 08/06/22  0504 08/04/22  0404 08/03/22  0637   WBC 13.91* 11.91* 13.17*   Hemoglobin 8.9* 8.7* 8.8*   Hematocrit 28.9* 26.7* 27.6*   Platelet Count 244 263 266     Recent Labs   Lab 08/06/22  0504 08/04/22  0404 08/03/22  0637 08/02/22  0527   Sodium 141 141 140 139   Potassium 4.3 4.2 4.9 4.5   Chloride 103 103 102 101   CO2 27 25 22 23    BUN 27* 25* 45* 34*   Creatinine 4.1* 3.8* 5.8* 4.6*   Calcium 8.4 8.4 8.8 8.5   Albumin 1.6* 1.6* 1.6* 1.6*    Phosphorus  --  3.6 4.7 3.7   Magnesium  --  1.8 1.9 1.9   Glucose 96 94 82 99   GFR 10.2* 11.1* 6.7* 8.8*           Signed by: Delice Bison, MD

## 2022-08-06 NOTE — Plan of Care (Signed)
Patient A&O x4, given pain meds as needed, afebrile.  Bm noted, assisted with meals.  Family updated at bedside.  Safety measures place, rounding in progress.    Problem: Moderate/High Fall Risk Score >5  Goal: Patient will remain free of falls  08/06/2022 1929 by Cecilio Asper, RN  Flowsheets (Taken 08/06/2022 1100)  High (Greater than 13): HIGH-Consider use of low bed  08/06/2022 1207 by Cecilio Asper, RN  Outcome: Progressing  Flowsheets (Taken 08/06/2022 1100)  High (Greater than 13): HIGH-Consider use of low bed     Problem: Compromised Sensory Perception  Goal: Sensory Perception Interventions  08/06/2022 1929 by Cecilio Asper, RN  Flowsheets (Taken 08/06/2022 1100)  Sensory Perception Interventions: Offload heels, Pad bony prominences, Reposition q 2hrs/turn Clock, Q2 hour skin assessment under devices if present  08/06/2022 1207 by Cecilio Asper, RN  Outcome: Progressing  Flowsheets (Taken 08/06/2022 1100)  Sensory Perception Interventions: Offload heels, Pad bony prominences, Reposition q 2hrs/turn Clock, Q2 hour skin assessment under devices if present     Problem: Compromised Moisture  Goal: Moisture level Interventions  08/06/2022 1929 by Cecilio Asper, RN  Flowsheets (Taken 08/06/2022 1100)  Moisture level Interventions: Moisture wicking products, Moisture barrier cream  08/06/2022 1207 by Cecilio Asper, RN  Outcome: Progressing  Flowsheets (Taken 08/06/2022 1100)  Moisture level Interventions: Moisture wicking products, Moisture barrier cream     Problem: Compromised Activity/Mobility  Goal: Activity/Mobility Interventions  08/06/2022 1929 by Cecilio Asper, RN  Flowsheets (Taken 08/06/2022 1100)  Activity/Mobility Interventions: Pad bony prominences, TAP Seated positioning system when OOB, Promote PMP, Reposition q 2 hrs / turn clock, Offload heels  08/06/2022 1207 by Cecilio Asper, RN  Outcome: Progressing  Flowsheets (Taken 08/06/2022 1100)  Activity/Mobility Interventions: Pad bony  prominences, TAP Seated positioning system when OOB, Promote PMP, Reposition q 2 hrs / turn clock, Offload heels     Problem: Compromised Nutrition  Goal: Nutrition Interventions  Outcome: Progressing     Problem: Compromised Friction/Shear  Goal: Friction and Shear Interventions  08/06/2022 1929 by Cecilio Asper, RN  Flowsheets (Taken 08/06/2022 1100)  Friction and Shear Interventions: Pad bony prominences, Off load heels, HOB 30 degrees or less unless contraindicated, Consider: TAP seated positioning, Heel foams  08/06/2022 1207 by Cecilio Asper, RN  Outcome: Progressing        Problem: Impaired Mobility  Goal: Mobility/Activity is maintained at optimal level for patient  08/06/2022 1929 by Cecilio Asper, RN  Flowsheets (Taken 08/05/2022 1255 by Wess Botts, RN)  Mobility/activity is maintained at optimal level for patient:   Encourage independent activity per ability   Consult/collaborate with Physical Therapy and/or Occupational Therapy  08/06/2022 1207 by Cecilio Asper, RN  Outcome: Progressing     Problem: Constipation  Goal: Fluid and electrolyte balance are achieved/maintained  08/06/2022 1929 by Cecilio Asper, RN  Flowsheets (Taken 08/06/2022 1148)  Fluid and electrolyte balance are achieved/maintained:   Monitor/assess lab values and report abnormal values   Assess and reassess fluid and electrolyte status   Observe for cardiac arrhythmias   Monitor for muscle weakness  08/06/2022 1207 by Cecilio Asper, RN  Outcome: Progressing  Flowsheets (Taken 08/06/2022 1148)  Fluid and electrolyte balance are achieved/maintained:   Monitor/assess lab values and report abnormal values   Assess and reassess fluid and electrolyte status   Observe for cardiac arrhythmias   Monitor for muscle weakness  Goal: Elimination patterns are normal or improving  Outcome: Progressing     Problem: Safety  Goal: Patient will be free from  injury during hospitalization  Outcome: Progressing  Goal: Patient will be free from  infection during hospitalization  Outcome: Progressing     Problem: Pain  Goal: Pain at adequate level as identified by patient  08/06/2022 1929 by Cecilio Asper, RN  Flowsheets (Taken 08/06/2022 1929)  Pain at adequate level as identified by patient:   Identify patient comfort function goal   Assess for risk of opioid induced respiratory depression, including snoring/sleep apnea. Alert healthcare team of risk factors identified.   Assess pain on admission, during daily assessment and/or before any "as needed" intervention(s)   Reassess pain within 30-60 minutes of any procedure/intervention, per Pain Assessment, Intervention, Reassessment (AIR) Cycle   Evaluate if patient comfort function goal is met   Evaluate patient's satisfaction with pain management progress   Offer non-pharmacological pain management interventions  08/06/2022 1207 by Cecilio Asper, RN  Outcome: Progressing     Problem: Side Effects from Pain Analgesia  Goal: Patient will experience minimal side effects of analgesic therapy  Outcome: Progressing     Problem: Hemodynamic Status: Cardiac  Goal: Stable vital signs and fluid balance  Outcome: Progressing  Flowsheets (Taken 08/06/2022 1148)  Stable vital signs and fluid balance:   Assess signs and symptoms associated with cardiac rhythm changes   Monitor lab values     Problem: Renal Instability  Goal: Fluid and electrolyte balance are achieved/maintained  08/06/2022 1929 by Cecilio Asper, RN  Flowsheets (Taken 08/06/2022 1148)  Fluid and electrolyte balance are achieved/maintained:   Monitor/assess lab values and report abnormal values   Assess and reassess fluid and electrolyte status   Observe for cardiac arrhythmias   Follow fluid restrictions/IV/PO parameters  08/06/2022 1207 by Cecilio Asper, RN  Outcome: Progressing  Flowsheets (Taken 08/06/2022 1148)  Fluid and electrolyte balance are achieved/maintained:   Monitor/assess lab values and report abnormal values   Assess and reassess fluid  and electrolyte status   Observe for cardiac arrhythmias   Follow fluid restrictions/IV/PO parameters     Problem: Patient Receiving Advanced Renal Therapies  Goal: Therapy access site remains intact  Outcome: Progressing     Problem: Fluid and Electrolyte Imbalance/ Endocrine  Goal: Fluid and electrolyte balance are achieved/maintained  08/06/2022 1929 by Cecilio Asper, RN  Flowsheets (Taken 08/06/2022 1148)  Fluid and electrolyte balance are achieved/maintained:   Monitor/assess lab values and report abnormal values   Assess and reassess fluid and electrolyte status   Observe for cardiac arrhythmias   Monitor for muscle weakness  08/06/2022 1207 by Cecilio Asper, RN  Outcome: Progressing  Flowsheets (Taken 08/06/2022 1148)  Fluid and electrolyte balance are achieved/maintained:   Monitor/assess lab values and report abnormal values   Assess and reassess fluid and electrolyte status   Observe for cardiac arrhythmias   Monitor for muscle weakness  Problem: Diabetes: Glucose Imbalance  Goal: Blood glucose stable at established goal  08/06/2022 1929 by Cecilio Asper, RN  Flowsheets (Taken 08/05/2022 1255 by Wess Botts, RN)  Blood glucose stable at established goal:   Monitor lab values   Monitor intake and output.  Notify LIP if urine output is < 30 mL/hour.   Follow fluid restrictions/IV/PO parameters   Include patient/family in decisions related to nutrition/dietary selections   Assess for hypoglycemia /hyperglycemia   Monitor/assess vital signs   Coordinate medication administration with meals, as indicated   Ensure appropriate diet and assess tolerance   Ensure adequate hydration   Ensure appropriate consults are obtained (Nutrition, Diabetes Education, and Case Management)  Ensure patient/family has adequate teaching materials  08/06/2022 1207 by Cecilio Asper, RN  Outcome: Progressing     Problem: Inadequate Gas Exchange  Goal: Patent Airway maintained  Outcome: Progressing  Goal: Adequate oxygenation  and improved ventilation  Outcome: Progressing     Problem: Neurological Deficit  Goal: Neurological status is stable or improving  08/06/2022 1929 by Cecilio Asper, RN  Flowsheets (Taken 08/06/2022 1929)  Neurological status is stable or improving:   Observe for seizure activity and initiate seizure precautions if indicated   Perform CAM Assessment   Re-assess NIH Stroke Scale for any change in status  08/06/2022 1207 by Cecilio Asper, RN  Outcome: Progressing     Problem: Potential for Aspiration  Goal: Risk of aspiration will be minimized  Outcome: Progressing     Problem: Every Day - Stroke  Goal: Mobility/Activity is maintained at optimal level for patient  08/06/2022 1929 by Cecilio Asper, RN  Flowsheets (Taken 08/05/2022 1255 by Wess Botts, RN)  Mobility/activity is maintained at optimal level for patient:   Encourage independent activity per ability   Consult/collaborate with Physical Therapy and/or Occupational Therapy  08/06/2022 1207 by Cecilio Asper, RN  Outcome: Progressing  Goal: Elimination patterns are normal or improving  Outcome: Progressing  Goal: Neurological status is stable or improving  08/06/2022 1929 by Cecilio Asper, RN  Flowsheets (Taken 08/06/2022 1929)  Neurological status is stable or improving:   Observe for seizure activity and initiate seizure precautions if indicated   Perform CAM Assessment   Re-assess NIH Stroke Scale for any change in status  7/Problem: Pain interferes with ability to perform ADL  Goal: Pain at adequate level as identified by patient  08/06/2022 1929 by Cecilio Asper, RN  Flowsheets (Taken 08/06/2022 1929)  Pain at adequate level as identified by patient:   Identify patient comfort function goal   Assess for risk of opioid induced respiratory depression, including snoring/sleep apnea. Alert healthcare team of risk factors identified.   Assess pain on admission, during daily assessment and/or before any "as needed" intervention(s)   Reassess pain  within 30-60 minutes of any procedure/intervention, per Pain Assessment, Intervention, Reassessment (AIR) Cycle   Evaluate if patient comfort function goal is met   Evaluate patient's satisfaction with pain management progress   Offer non-pharmacological pain management interventions  08/06/2022 1207 by Cecilio Asper, RN  Outcome: Progressing     Problem: Everyday - Heart Failure  Goal: Mobility/Activity is Maintained at Optimal Level for Patient  08/06/2022 1929 by Cecilio Asper, RN  Flowsheets (Taken 08/05/2022 1255 by Wess Botts, RN)  Mobility/Activity is Maintained at Optimal Level for Patient:   Reposition patient every 2 hours and as needed unless able to reposition self   Increase mobility as tolerated/progressive mobility protocol   Maintain SCD's as Ordered   Perform active/passive ROM   Assess for changes in respiratory status, level of consciousness and/or development of fatigue   Consult with Physical Therapy and/or Occupational Therapy  08/06/2022 1207 by Cecilio Asper, RN  Outcome: Progressing  Goal: Nutritional Intake is Adequate  08/06/2022 1929 by Cecilio Asper, RN  Outcome: Progressing  Flowsheets (Taken 08/05/2022 1255 by Wess Botts, RN)  Nutritional Intake is Adequate:   Fluid Restricction if needed   Consult/Collaborate with Nutritionist   Patient and family teaching on low sodium diet   Cardiac diet-2 gm Sodium   Assess appetite,anorexia and amount of meal/food tolerated   Encourage/perform oral hygiene as appropriate  08/06/2022 1207 by Cecilio Asper, RN  Outcome:  Progressing     Problem: Compromised Sensory Perception  Goal: Sensory Perception Interventions  08/06/2022 1929 by Cecilio Asper, RN  Flowsheets (Taken 08/06/2022 1100)  Sensory Perception Interventions: Offload heels, Pad bony prominences, Reposition q 2hrs/turn Clock, Q2 hour skin assessment under devices if present  08/06/2022 1207 by Cecilio Asper, RN  Outcome: Progressing  Flowsheets (Taken 08/06/2022  1100)  Sensory Perception Interventions: Offload heels, Pad bony prominences, Reposition q 2hrs/turn Clock, Q2 hour skin assessment under devices if present     Problem: Compromised Moisture  Goal: Moisture level Interventions  08/06/2022 1929 by Cecilio Asper, RN  Flowsheets (Taken 08/06/2022 1100)  Moisture level Interventions: Moisture wicking products, Moisture barrier cream  08/06/2022 1207 by Cecilio Asper, RN  Outcome: Progressing  Flowsheets (Taken 08/06/2022 1100)  Moisture level Interventions: Moisture wicking products, Moisture barrier cream     Problem: Compromised Nutrition  Goal: Nutrition Interventions  Outcome: Progressing     Problem: Impaired Mobility  Goal: Mobility/Activity is maintained at optimal level for patient  08/06/2022 1929 by Cecilio Asper, RN  Flowsheets (Taken 08/05/2022 1255 by Wess Botts, RN)  Mobility/activity is maintained at optimal level for patient:   Encourage independent activity per ability   Consult/collaborate with Physical Therapy and/or Occupational Therapy  08/06/2022 1207 by Cecilio Asper, RN  Outcome: Progressing     Problem: Constipation  Goal: Fluid and electrolyte balance are achieved/maintained  08/06/2022 1929 by Cecilio Asper, RN  Flowsheets (Taken 08/06/2022 1148)  Fluid and electrolyte balance are achieved/maintained:   Monitor/assess lab values and report abnormal values   Assess and reassess fluid and electrolyte status   Observe for cardiac arrhythmias   Monitor for muscle weakness  08/06/2022 1207 by Cecilio Asper, RN  Outcome: Progressing  Flowsheets (Taken 08/06/2022 1148)  Fluid and electrolyte balance are achieved/maintained:   Monitor/assess lab values and report abnormal values   Assess and reassess fluid and electrolyte status   Observe for cardiac arrhythmias   Monitor for muscle weakness     Problem: Constipation  Goal: Fluid and electrolyte balance are achieved/maintained  08/06/2022 1929 by Cecilio Asper, RN  Flowsheets (Taken  08/06/2022 1148)  Fluid and electrolyte balance are achieved/maintained:   Monitor/assess lab values and report abnormal values   Assess and reassess fluid and electrolyte status   Observe for cardiac arrhythmias   Monitor for muscle weakness  08/06/2022 1207 by Cecilio Asper, RN  Outcome: Progressing  Flowsheets (Taken 08/06/2022 1148)  Fluid and electrolyte balance are achieved/maintained:   Monitor/assess lab values and report abnormal values   Assess and reassess fluid and electrolyte status   Observe for cardiac arrhythmias   Monitor for muscle weakness     Problem: Pain  Goal: Pain at adequate level as identified by patient  08/06/2022 1929 by Cecilio Asper, RN  Flowsheets (Taken 08/06/2022 1929)  Pain at adequate level as identified by patient:   Identify patient comfort function goal   Assess for risk of opioid induced respiratory depression, including snoring/sleep apnea. Alert healthcare team of risk factors identified.   Assess pain on admission, during daily assessment and/or before any "as needed" intervention(s)   Reassess pain within 30-60 minutes of any procedure/intervention, per Pain Assessment, Intervention, Reassessment (AIR) Cycle   Evaluate if patient comfort function goal is met   Evaluate patient's satisfaction with pain management progress   Offer non-pharmacological pain management interventions  08/06/2022 1207 by Cecilio Asper, RN  Outcome: Progressing     Problem: Renal Instability  Goal: Fluid and electrolyte  balance are achieved/maintained  08/06/2022 1929 by Cecilio Asper, RN  Flowsheets (Taken 08/06/2022 1148)  Fluid and electrolyte balance are achieved/maintained:   Monitor/assess lab values and report abnormal values   Assess and reassess fluid and electrolyte status   Observe for cardiac arrhythmias   Follow fluid restrictions/IV/PO parameters  08/06/2022 1207 by Cecilio Asper, RN  Outcome: Progressing  Flowsheets (Taken 08/06/2022 1148)  Fluid and electrolyte balance are  achieved/maintained:   Monitor/assess lab values and report abnormal values   Assess and reassess fluid and electrolyte status   Observe for cardiac arrhythmias   Follow fluid restrictions/IV/PO parameters     Problem: Fluid and Electrolyte Imbalance/ Endocrine  Goal: Fluid and electrolyte balance are achieved/maintained  08/06/2022 1929 by Cecilio Asper, RN  Flowsheets (Taken 08/06/2022 1148)  Fluid and electrolyte balance are achieved/maintained:   Monitor/assess lab values and report abnormal values   Assess and reassess fluid and electrolyte status   Observe for cardiac arrhythmias   Monitor for muscle weakness  08/06/2022 1207 by Cecilio Asper, RN  Outcome: Progressing  Flowsheets (Taken 08/06/2022 1148)  Fluid and electrolyte balance are achieved/maintained:   Monitor/assess lab values and report abnormal values   Assess and reassess fluid and electrolyte status   Observe for cardiac arrhythmias   Monitor for muscle weakness     Problem: Diabetes: Glucose Imbalance  Goal: Blood glucose stable at established goal  08/06/2022 1929 by Cecilio Asper, RN  Flowsheets (Taken 08/05/2022 1255 by Wess Botts, RN)  Blood glucose stable at established goal:   Monitor lab values   Monitor intake and output.  Notify LIP if urine output is < 30 mL/hour.   Follow fluid restrictions/IV/PO parameters   Include patient/family in decisions related to nutrition/dietary selections   Assess for hypoglycemia /hyperglycemia   Monitor/assess vital signs   Coordinate medication administration with meals, as indicated   Ensure appropriate diet and assess tolerance   Ensure adequate hydration   Ensure appropriate consults are obtained (Nutrition, Diabetes Education, and Case Management)   Ensure patient/family has adequate teaching materials  08/06/2022 1207 by Cecilio Asper, RN  Outcome: Progressing

## 2022-08-06 NOTE — Progress Notes (Signed)
Wound Ostomy Continence Consultation / Progress Note    Date Time: 08/06/22 5:01 PM  Patient Name: Shelby Barron, Shelby Barron  Consulting Service: [facility] Day: 40     Reason for Consult / Follow Up   Vac Dressing Change    Assessment & Plan   Assessment:   At bedside for vac change.  Family Friend/caregiver Hazel at bedside.  Patient premedicated for pain.     Wound Type and Location:  Surgical Wound Mid Abdomen/RLQ Wound     Wound 06/30/22 Surgical Incision Abdomen Other (Comment) WOUND VAC applied (Active)   Date First Assessed/Time First Assessed: 06/30/22 1152   Wound Type: Surgical Incision  Location: Abdomen  Wound Location Orientation: Other (Comment)  Wound Description (Comments): WOUND VAC applied      Assessments 08/06/2022  4:00 PM   Wound Image     Wound Base Description Adipose;Bleeding;Full Thickness;Granulation tissue;Moist;Red;Dark edges   % Granular 85   % Non-viable 15   Structures Present Tendon and muscle   Peri-wound Description Clean;Excoriated;Fragile;Hyperpigmented;Dark edges;Induration;Other (Comment) (Peri wound between 6 and 9O'clock  adipose tissue)   Wound Length (cm) 27 cm   Wound Width (cm) 7 cm   Wound Depth (cm) 6 cm   Wound Surface Area (cm^2) 189 cm^2   Wound Volume (cm^3) 1134 cm^3   Closure Open   Drainage Amount Small   Drainage Description Serosanguinous;Thick   Tunneling No   Undermining  No   Treatments Negative Pressure Wound Therapy;Cleansed   Dressing Non Adherent Contact Layer;Surgicel/QuikClot;Transparent Film;Foam    Wound Edges: Right lower Quadrant wound edges between 6 and 9 O'clock (peri wound debrided) 7 cm x 6 cm x 0.2  cm (full thickness skin exposed subcutaneous tissue visible, dark pink moist wound bed)  PeriWound: hyper pigmented wound between 6 and 9 O'clock, hyperpigmented (with pale pink wound edges around RLQ)    Wound care to mid abdomen/ Vac Dressing Change     Area cleansed with Vashe wash (soaked for 5-10 minutes ) and  patted dry.    Applied   No-Sting skin barrier film to peri-wound and allowed to dry.   Placed Vac drape around peri-wound window paned style, to prevent maceration and applied Eakin's ring around umbilicus, top of wound, peri wound RLQ wound and along the pannus and  curvature of pannus for more adherence.     Packed 1 pc (cut into two pieces) of silver non adherent dressing and top with  two pieces of white foam and 3 pieces of of black foam into the wound (two pieces mid-abdomen bridged to RLQ peri wound 1 pc)  Secured with vac drape.   A quarter size cut hole was cut into the black foam to fit the trackpad.   Good seal obtained and wound vacuum setting at 125 mm Hg, low intensity continuous.  Aseptic technique utilized patient called out for  "Aunt Mattie "able to calm and comfort patient.  PRN pain medication given.         Vashe Wound Solution helps to cleanse the wound and accomplish the goals of wound bed preparation in a biocompatible, safe, effective, and natural way. This solution of hypochlorous acid (HOCl) acts as a preservative that inhibits microbial contamination within the solution. Vashe Wound Solution has the highest concentration of pure HOCl.     Vashe comes from supply rooms or central supply.     Wound Photography:       Plan/Follow-Up:   Continue pressure prevention bundle:  Head of the bed 30 degrees or less  Positioning device to the bedside  Eliminate/minimize pressure from the area  Float heels with boots or pillows  Turn patient  Pressure redistribution cushion to the chair  Use lift sheets/low friction surface sheets for positioning.  Pad bony prominences  Nutrition consult/Optimize nutrition  Initiate bed algorithm/Specialty bed  Moisture/Incontinence management - Cleanse with incontinence cleansing wipe/water to manage incontinence and protect skin from exposure to urine and stool. Apply skin barrier protection cream. Apply [place]/external or female external urine management system to prevent urinary  contamination of a wound. Apply rectal pouch/fecal management system per unit policy, to prevent fecal contamination of a wound or contain diarrhea.            1.  Wound care orders entered into EMR.   2.  Care rendered.  3.  RN checks wound VAC Q 2 hr: Sponge contracted;  pump "ON" and plugged in.    4.  If unable to maintain the seal, apply Tegaderm dressing or vac drape to seal. May also contact [facility] at 1 - [phone] to troubleshoot.  5.  If still unable to maintain the seal and vac off or not working for 2 hours, remove all foam and place hydrogel moistened gauze into wound bed, cover with ABD pad secured with tape, and notify Physician and WOC team.          6.  Change two-to three times a week. Next vac change due on Monday     When patient discharged or VAC discontinued -   Remove all foam dressing and non adherent dressing and place hydrogel moistened gauze into the wound bed, cover with ABD pad tape to secure.   Wound Vac machine - discard canister and tubing.    Place wound vac in CLEAR plastic bag and place in the soiled utility room. DO NOT USE RED BIOHAZARD BAGS.    Please contact WOC office [phone] and leave a message for WOC to pick up.        Discharge VAC Wound Information     Prescribed [facility] VAC Therapy for duration:  4 months      Discharge VAC Wound Information     Surgical Wound  mid abdomen/RLQ  Age of wound: 06/28/22  Did VAC initiate at the hospital?     yes         Initiated - 06/28/22  Compromised Nutritional status? no Nutrition consulted for wound care needs.  Other therapies previously tried: no.  Is there eschar present in the wound: no  Has wound been debrided in the last ten days:   yes 08/01/22  Are serial debridements required: no  Location: mid abdomen and RLQ      Measurements: Date- 08/06/22 Surgical Wound Midline                            Length- 27 cm                             Width- 7 cm                            Depth-  6 cm                            Undermining- no  cm  Tunneling-  no cm     Right lower Quadrant wound edges between 6 and 9 O'clock (peri wound debrided) 7 cm x 6 cm x 0.1 cm (full thickness skin exposed subq tissue visible)wound bed red, pink and tan sub cutaneous tissue     The appearance of wound bed and color: red, pink, brown  Exudate: sanguinous  Is wound full-thickness: yes  Is bone, tendon or muscle exposed: yes  Is there undermining no  Is there tunneling no     7.   Type of Foam used: black simplace and white foam  Number of pieces used: 3 black and 1` white (3 pieces of of black foam into the wound -two pieces mid-abdomen bridged to RLQ peri wound 1 pc)   Contact layer applied: silver non adherent  Continuous negative pressure setting: 125 mm Hg, low, intensity continuous     Has the WOCN team attempted other dressings: no  WOCN assessed clinical reasons for the continuation of VAC at home:  Promotes wound healing, Speed granulation tissue, prevent infection             Specialty Bed: Centrella Mattress (Med-Surg) - Innovative support surfaces help manage pressure, shear and moisture to deliver optimal wound prevention and healing.     Braden: Braden Scale Score: 11 (08/06/22 1100)    Braden Subscales:  Sensory Perceptions: Very limited (08/06/22 1100)  Moisture: Occasionally moist (08/06/22 1100)  Activity: Bedfast (08/06/22 1100)  Mobility: Very limited (08/06/22 1100)  Nutrition: Probably inadequate (08/06/22 1100)  Friction and Shear: Problem (08/06/22 1100)  History of Present Illness   This is a 82 y.o. female  has a past medical history of Acid reflux, Asthma, well controlled, Cancer of kidney, Chronic lower back pain, Congestive heart disease, COPD (chronic obstructive pulmonary disease), CVA (cerebral infarction) (01/12/2003), Diabetes, Diabetic nephropathy, Fibroids, Gout, H/O: gout, Hyperlipidemia, Hypertension, Neuropathy of hand, and SOB (shortness of breath)..  Admitted with Bladder cancer.        Procedure(s) with  comments:  TUNNELED CATH REMOVAL - right neck/chest tunneled HD catheter removal    39 Days Post-Op  -------------------  **Canceled**    Procedure(s) with comments:  TUNNELED CATH REMOVAL - right neck/chest tunneled HD catheter removal    * No surgery date entered *  -------------------    Procedure(s) with comments:  TUNNELED CATH REMOVAL - right neck/chest tunneled HD catheter removal    37 Days Post-Op  -------------------    Procedure(s) with comments:  TUNNELED CATH REMOVAL - right neck/chest tunneled HD catheter removal    23 Days Post-Op  -------------------    Procedure(s) with comments:  TUNNELED CATH REMOVAL - right neck/chest tunneled HD catheter removal    23 Days Post-Op  -------------------        From Nursing/Other Documentation:   Bowel Incontinence: No (08/06/22 1212)  Last BM Date: 08/03/22 (08/06/22 1100)  Urinary Incontinence: No (08/06/22 1212)    Ht Readings from Last 1 Encounters:   07/18/22 1.575 m (5\' 2" )     Wt Readings from Last 3 Encounters:   08/06/22 83.1 kg (183 lb 3.2 oz)   06/25/22 87.1 kg (192 lb)   06/23/22 84.8 kg (187 lb)     Body mass index is 33.51 kg/m.    Recent Labs     08/06/22  0504   WBC 13.91*   RBC 3.12*   Hemoglobin 8.9*   Hematocrit 28.9*   Sodium 141   Potassium 4.3   Chloride 103  CO2 27   BUN 27*   Creatinine 4.1*   Calcium 8.4   Albumin 1.6*   GFR 10.2*   Glucose 96     Lab Results   Component Value Date    HGBA1C 5.6 07/14/2022    HGBA1C 8.5 (H) 05/27/2022    GLU 96 08/06/2022    LDL 23 07/14/2022    CREAT 4.1 (H) 08/06/2022     Recent Labs   Lab 08/06/22  1150 08/06/22  0749 08/05/22  2125 08/05/22  1644 08/05/22  1354 08/05/22  0751 08/04/22  2137 08/04/22  1527 08/04/22  1213 08/04/22  0741   Whole Blood Glucose POCT 162* 110* 133* 99 117* 94 125* 115* 164* 102*     No results for input(s): "CK" in the last 72 hours.    Vedanth Sirico "Pepper" Talisha Erby  MSN, RN, WOCN  [facility]  Wound, Ostomy, and  Continence Coordinator  [phone]  Office  [phone] [employer]  [email or web address]

## 2022-08-07 LAB — WHOLE BLOOD GLUCOSE POCT: Whole Blood Glucose POCT: 101 mg/dL — ABNORMAL HIGH (ref 70–100)

## 2022-08-07 MED ORDER — NEPHRO-VITE 0.8 MG PO TABS
1.0000 | ORAL_TABLET | Freq: Every day | ORAL | Status: DC
Start: 2022-08-08 — End: 2022-09-05

## 2022-08-07 MED ORDER — SODIUM CHLORIDE 0.9 % IV BOLUS
100.0000 mL | INTRAVENOUS | Status: AC | PRN
Start: 2022-08-07 — End: 2022-08-07

## 2022-08-07 MED ORDER — SODIUM CHLORIDE 0.9 % IV BOLUS
250.0000 mL | INTRAVENOUS | Status: AC | PRN
Start: 2022-08-07 — End: 2022-08-07

## 2022-08-07 NOTE — Progress Notes (Signed)
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 s/p bilateral radical nephrectomies and  cystectomy on 06/28/22  - complicated by small bowel enterotomy and blood loss   New onset afib - on amiodarone and anticoagulation  High grade urothelial bladder CA: s/p bilateral nephrectomies and cystectomy c/b mall bowel enterotomy and blood loss (~1L) on 06/28/22  - awaiting wound debridement   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 - remains low, adequate iron   - on aranesp  Encephalopathy - better  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   HTN - BP controlled    Recommendations:   HD today  Pending insurance approval for discharge to [place] nursing facility  - we will follow her there      Case discussed with: patient    Glenard Haring, MD  [facility]  [phone] (office)  X [phone] (FFX Spectra-Link)      Subjective: Awake.  No current complaints.     Review of Systems:   No current pain    Physical Exam:     Vitals:    08/06/22 2334 08/07/22 0130 08/07/22 0346 08/07/22 0520   BP: 124/66  112/47    Pulse: 82  77    Resp: 16 16 18     Temp: 98.1 F (36.7 C)  98.2 F (36.8 C)    TempSrc: Axillary  Axillary    SpO2: 94%  95%    Weight:    85.7 kg (188 lb 15 oz)   Height:           Intake and Output Summary (Last 24 hours) at Date Time    Intake/Output Summary (Last 24 hours) at 08/07/2022 7062  Last data filed at 08/07/2022 3762  Gross per 24 hour   Intake 410 ml   Output 0 ml   Net 410 ml         General: awake, no acute  distress  Cardiovascular: regular rate and rhythm  Lungs: CTA B without rhonchi or wheezing  Abdomen: soft  Extremities: no edema, heel protectors in place  Access: R permacath     Meds:      Scheduled Meds: PRN Meds:    acetaminophen, 650 mg, Oral, 4 times per day   Or  acetaminophen, 650 mg, Oral, 4 times per day  amiodarone, 200 mg, per NG tube, Daily  apixaban, 2.5 mg, Oral, Q12H SCH  atorvastatin, 40 mg, Oral, QHS  b complex-vitamin c-folic acid, 1 tablet, Oral, Daily  carvedilol, 12.5 mg, Oral, Q12H SCH  darbepoetin alfa, 0.45 mcg/kg, Subcutaneous, Weekly  glycopyrrolate, 0.2 mg, Intravenous, Q8H  insulin lispro, 1-3 Units, Subcutaneous, QHS  insulin lispro, 1-5 Units, Subcutaneous, TID AC  lidocaine, 20 mL, Intradermal, Once  pantoprazole, 40 mg, per NG tube, Q12H SCH  thiamine, 100 mg, per NG tube, Daily  umeclidinium-vilanterol, 1 puff, Inhalation, QAM          Continuous Infusions:  sodium chloride, , PRN  acetaminophen, 650 mg, Q6H PRN  albuterol-ipratropium, 3 mL, Q6H PRN  benzocaine, 1 spray, TID PRN  calcium carbonate, 500 mg, Q4H PRN  dextrose, 15 g of glucose, PRN   Or  dextrose, 12.5 g, PRN   Or  dextrose, 12.5 g, PRN   Or  glucagon (rDNA), 1 mg, PRN  diphenhydrAMINE, 25 mg, Q6H PRN  hydrALAZINE, 10 mg, Q3H PRN  HYDROmorphone, 1 mg, Q4H PRN  labetalol, 10 mg, Q15 Min PRN  lidocaine 2% jelly, , Once PRN  naloxone, 0.2 mg, PRN  ondansetron, 4 mg, Q6H PRN  oxyCODONE, 2.5 mg, Q4H PRN  phenol, 1 spray, Q2H PRN              Labs:     Recent Labs   Lab 08/06/22  0504 08/04/22  0404 08/03/22  0637   WBC 13.91* 11.91* 13.17*   Hemoglobin 8.9* 8.7* 8.8*   Hematocrit 28.9* 26.7* 27.6*   Platelet Count 244 263 266     Recent Labs   Lab 08/06/22  0504 08/04/22  0404 08/03/22  0637 08/02/22  0527   Sodium 141 141 140 139   Potassium 4.3 4.2 4.9 4.5   Chloride 103 103 102 101   CO2 27 25 22 23    BUN 27* 25* 45* 34*   Creatinine 4.1* 3.8* 5.8* 4.6*   Calcium 8.4 8.4 8.8 8.5   Albumin 1.6* 1.6* 1.6* 1.6*    Phosphorus  --  3.6 4.7 3.7   Magnesium  --  1.8 1.9 1.9   Glucose 96 94 82 99   GFR 10.2* 11.1* 6.7* 8.8*           Signed by: Glenard Haring, MD

## 2022-08-07 NOTE — Progress Notes (Signed)
Patient is refusing Protonix. She takes everything else, but will not accept the Protonix.

## 2022-08-07 NOTE — Progress Notes (Signed)
HD TX started via R CVC, working well, pt is awake & responsive, disoriented, VS WDL, Report received from [facility] RN.   08/07/22 0945   Bedside Nurse Communication   Name of bedside RN - pre dialysis [facility] RN   Treatment Initiation- With Dialysis Precautions   Time Out/Safety Check Completed Yes   Consent for HD signed for this hospitalization (Date) 06/01/22   Consent for HD signed for this hospitalization (Time) 1516   Preferred language No   Complication waiver N   Blood Consent Verified N/A   Dialysis Precautions All Connections Secured   Dialysis Treatment Type Routine   Is patient diabetic? Yes   RO/Hemodialysis Cabin crew   Is Total Chlorine less than 0.1 ppm? Yes   Orignial Total Chlorine Testing Time 0930   At 4 Hour Total Chlorine Testing Time 1330   RO/Hemodialysis Arts development officer Number 6    Machine Serial Number [id number]   RO # 27   RO Serial # [id number]   Water Hardness NA   pH 7.2   Pressure Test Verified Yes   Alarms Verified Passed   Machine Temperature 97.7 F (36.5 C)   Alarms Verified Yes   Na+ mEq (Machine) 138 mEq   Bicarb mEq (Machine) 35 mEq   Hemodialysis Conductivity (Machine) 13.6   Hemodialysis Conductivity (Meter) 13.8   Dialyzer Lot Number [id number]   Tubing Lot Number [id number]   RO Machine Log Completed Yes   Hepatitis Status   HBsAg (Antigen) Result Negative   HBsAg Date Drawn 08/02/22   HBsAg Repeat Draw Due Date 08/30/22   Dialysis Weight   Pre-Treatment Weight (Kg) 86.9   Scale Type ICU Bed Scale   Vitals   Temp 98 F (36.7 C)   Heart Rate 70   Resp Rate 15   BP 119/56   O2 Device None (Room air)   Assessment   Mental Status Alert;Disoriented   Cardiac (WDL) WDL   Cardiac Regularity Irregular   Cardiac Symptoms Return to [facility]   Respiratory  WDL   Respiratory Pattern Return to WDL   Bilateral Breath Sounds Clear;Diminished   R Breath Sounds Diminished   L Breath Sounds Diminished   Cough Weak   Edema  WDL   Generalized Edema Non Pitting Edema    Facial Non Pitting Edema   RLE Edema Non Pitting Edema   LLE Edema Non Pitting Edema   General Skin Color Appropriate for ethnicity   Skin Temp Dry;Warm   Gastrointestinal (WDL) WDL   Abdomen Inspection Distended   Mobility Bed   Permacath Catheter - Tunneled 07/14/22 Subclavian vein catheter Right   Placement Date/Time: 07/14/22 1404   Inserted by: Dr. Welton Flakes  Access Type: Subclavian vein catheter  Orientation: Right  Central Line Infection Prevention Education provided?: Yes  Hand Hygiene: Alcohol based hand scrub;Soap and water  Line cart u...   Line necessity reviewed? Apheresis/hemodialysis   Site Assessment Clean;Dry;Intact   Catheter Lumen Volume Venous 1.6 mL   Catheter Lumen Volume Arterial 1.6 mL   Dressing Status and Intervention Dressing Intact;Clean & Dry   Tego/Curos Caps on Catheter Yes   NEW Tego/Curos Caps placed (Date) 08/06/22   APHERESIS ONLY:  Access  Red   APHERESIS ONLY: Return Blue   End Caps Free From Blood Yes   Line Care Connections checked and tightened   Dressing Type Transparent;Biopatch   Dressing Status Clean;Dry;Intact   Dressing/Line Intervention Caps changed  Pain Assessment   Charting Type Assessment   Pain Scale Used PAINAD (Advanced Dementia)   Numeric Pain Scale   Pain Score 0   POSS Score 1   Multiple Pain Sites No   Hemodialysis Comments   Pre-Hemodialysis Comments TIMEOUT & SAEFTY CHECKS DONE

## 2022-08-07 NOTE — Plan of Care (Addendum)
NURSING SHIFT NOTE     Patient: Shelby Barron  Day: 40      SHIFT EVENTS     Shift Narrative/Significant Events (PRN med administration, fall, RRT, etc.):     -Pt a&ox1 to self on RA  -Incontinence and wound care done  -Scheduled meds given and tolerated by pt crushed in apple sauce  -Dialysis done  -PRN PO oxy given for pain d/t pt grimacing  -Asked MD if pt needs accucheck, MD stated can d/c accucheck  -MD stated labs every other day  -Call bell within reach     Safety and fall precautions remain in place. Purposeful rounding completed.          ASSESSMENT     Changes in assessment from patient's baseline this shift:    Neuro: No  CV: No  Pulm: No  Peripheral Vascular: No  HEENT: No  GI: No  BM during shift: No, Last BM: Last BM Date: 08/03/22  GU: No   Integ: No  MS: No    Pain: Improved  Pain Interventions: Medications  Medications Utilized: PO oxy    Mobility: PMP Activity: Step 3 - Bed Mobility of Distance Walked (ft) (Step 6,7): 0 Feet           Lines     Patient Lines/Drains/Airways Status       Active Lines, Drains and Airways       Name Placement date Placement time Site Days    Permacath Catheter - Tunneled 07/14/22 Subclavian vein catheter Right 07/14/22  1404  -- 24    Midline IV 07/01/22 Anterior;Left Upper Arm 07/01/22  1315  Upper Arm  37                         VITAL SIGNS     Vitals:    08/07/22 1923   BP: 104/62   Pulse: 87   Resp:    Temp: 98.8 F (37.1 C)   SpO2: 96%       Temp  Min: 97.8 F (36.6 C)  Max: 98.8 F (37.1 C)  Pulse  Min: 70  Max: 97  Resp  Min: 15  Max: 18  BP  Min: 83/58  Max: 124/66  SpO2  Min: 94 %  Max: 96 %      Intake/Output Summary (Last 24 hours) at 08/07/2022 2028  Last data filed at 08/07/2022 1700  Gross per 24 hour   Intake 270 ml   Output 2600 ml   Net -2330 ml            CARE PLAN        Problem: Moderate/High Fall Risk Score >5  Goal: Patient will remain free of falls  Outcome: Progressing  Flowsheets (Taken 08/07/2022 0800)  High (Greater than 13):   HIGH-Visual  cue at entrance to patient's room   HIGH-Bed alarm on at all times while patient in bed   HIGH-Consider use of low bed     Problem: Compromised Sensory Perception  Goal: Sensory Perception Interventions  Outcome: Progressing  Flowsheets (Taken 08/07/2022 0800)  Sensory Perception Interventions: Offload heels, Pad bony prominences, Reposition q 2hrs/turn Clock, Q2 hour skin assessment under devices if present     Problem: Compromised Moisture  Goal: Moisture level Interventions  Outcome: Progressing  Flowsheets (Taken 08/07/2022 0800)  Moisture level Interventions: Moisture wicking products, Moisture barrier cream     Problem: Compromised Activity/Mobility  Goal: Activity/Mobility Interventions  Outcome:  Progressing  Flowsheets (Taken 08/07/2022 0800)  Activity/Mobility Interventions: Pad bony prominences, TAP Seated positioning system when OOB, Promote PMP, Reposition q 2 hrs / turn clock, Offload heels     Problem: Compromised Nutrition  Goal: Nutrition Interventions  Outcome: Progressing  Flowsheets (Taken 08/07/2022 0800)  Nutrition Interventions: Discuss nutrition at Rounds, I&Os, Document % meal eaten, Daily weights     Problem: Compromised Friction/Shear  Goal: Friction and Shear Interventions  Outcome: Progressing  Flowsheets (Taken 08/07/2022 0800)  Friction and Shear Interventions: Pad bony prominences, Off load heels, HOB 30 degrees or less unless contraindicated, Consider: TAP seated positioning, Heel foams     Problem: Inadequate Airway Clearance  Goal: Patent Airway maintained  Outcome: Progressing  Flowsheets (Taken 08/05/2022 1255 by Wess Botts, RN)  Patent airway maintained:   Position patient for maximum ventilatory efficiency   Provide adequate fluid intake to liquefy secretions   Suction secretions as needed   Reinforce use of ordered respiratory interventions (i.e. CPAP, BiPAP, Incentive Spirometer, Acapella, etc.)   Reposition patient every 2 hours and as needed unless able to  self-reposition  Goal: Normal respiratory rate/effort achieved/maintained  Outcome: Progressing     Problem: Infection Prevention  Goal: Free from infection  Outcome: Progressing  Flowsheets (Taken 08/05/2022 1255 by Wess Botts, RN)  Free from infection:   Monitor/assess vital signs   Encourage/assist patient to turn, cough and perform deep breathing every 2 hours   Assess incision for evidence of healing   Monitor/assess output from surgical drain if present   Assess for signs and symptoms of infection   Monitor/assess lab values and report abnormal values     Problem: Impaired Mobility  Goal: Mobility/Activity is maintained at optimal level for patient  Outcome: Progressing  Flowsheets (Taken 08/07/2022 0135 by Lucky Rathke, RN)  Mobility/activity is maintained at optimal level for patient: Encourage independent activity per ability     Problem: Constipation  Goal: Fluid and electrolyte balance are achieved/maintained  Outcome: Progressing  Flowsheets (Taken 08/06/2022 1148 by Cecilio Asper, RN)  Fluid and electrolyte balance are achieved/maintained:   Monitor/assess lab values and report abnormal values   Assess and reassess fluid and electrolyte status   Observe for cardiac arrhythmias   Monitor for muscle weakness  Goal: Elimination patterns are normal or improving  Outcome: Progressing  Flowsheets (Taken 07/29/2022 1755 by Lorriane Shire, RN)  Elimination patterns are normal or improving: Assess for and discuss C. diff screening with LIP     Problem: Safety  Goal: Patient will be free from injury during hospitalization  Outcome: Progressing  Flowsheets (Taken 08/05/2022 1255 by Wess Botts, RN)  Patient will be free from injury during hospitalization:   Assess patient's risk for falls and implement fall prevention plan of care per policy   Use appropriate transfer methods   Provide and maintain safe environment   Include patient/ family/ care giver in decisions related to safety   Ensure  appropriate safety devices are available at the bedside   Hourly rounding   Assess for patients risk for elopement and implement Elopement Risk Plan per policy   Provide alternative method of communication if needed (communication boards, writing)  Goal: Patient will be free from infection during hospitalization  Outcome: Progressing  Flowsheets (Taken 08/05/2022 1255 by Wess Botts, RN)  Free from Infection during hospitalization:   Monitor lab/diagnostic results   Monitor all insertion sites (i.e. indwelling lines, tubes, urinary catheters, and drains)   Assess and monitor for signs and symptoms of  infection   Encourage patient and family to use good hand hygiene technique     Problem: Pain  Goal: Pain at adequate level as identified by patient  Outcome: Progressing  Flowsheets (Taken 08/07/2022 0132 by Lucky Rathke, RN)  Pain at adequate level as identified by patient:   Identify patient comfort function goal   Assess pain on admission, during daily assessment and/or before any "as needed" intervention(s)   Reassess pain within 30-60 minutes of any procedure/intervention, per Pain Assessment, Intervention, Reassessment (AIR) Cycle   Evaluate if patient comfort function goal is met     Problem: Side Effects from Pain Analgesia  Goal: Patient will experience minimal side effects of analgesic therapy  Outcome: Progressing  Flowsheets (Taken 07/31/2022 0618 by Connye Burkitt, RN)  Patient will experience minimal side effects of analgesic therapy: Assess for changes in cognitive function     Problem: Hemodynamic Status: Cardiac  Goal: Stable vital signs and fluid balance  Outcome: Progressing  Flowsheets (Taken 08/06/2022 1148 by Cecilio Asper, RN)  Stable vital signs and fluid balance:   Assess signs and symptoms associated with cardiac rhythm changes   Monitor lab values     Problem: Renal Instability  Goal: Fluid and electrolyte balance are achieved/maintained  Outcome: Progressing  Flowsheets (Taken  08/06/2022 1148 by Cecilio Asper, RN)  Fluid and electrolyte balance are achieved/maintained:   Monitor/assess lab values and report abnormal values   Assess and reassess fluid and electrolyte status   Observe for cardiac arrhythmias   Follow fluid restrictions/IV/PO parameters     Problem: Patient Receiving Advanced Renal Therapies  Goal: Therapy access site remains intact  Outcome: Progressing  Flowsheets (Taken 08/07/2022 0132 by Lucky Rathke, RN)  Therapy access site remains intact: Assess therapy access site     Problem: Fluid and Electrolyte Imbalance/ Endocrine  Goal: Fluid and electrolyte balance are achieved/maintained  Outcome: Progressing  Flowsheets (Taken 08/06/2022 1148 by Cecilio Asper, RN)  Fluid and electrolyte balance are achieved/maintained:   Monitor/assess lab values and report abnormal values   Assess and reassess fluid and electrolyte status   Observe for cardiac arrhythmias   Monitor for muscle weakness  Goal: Adequate hydration  Outcome: Progressing  Flowsheets (Taken 08/05/2022 1255 by Wess Botts, RN)  Adequate hydration:   Assess mucus membranes, skin color, turgor, perfusion and presence of edema   Assess for peripheral, sacral, periorbital and abdominal edema   Monitor and assess vital signs and perfusion     Problem: Diabetes: Glucose Imbalance  Goal: Blood glucose stable at established goal  Outcome: Progressing     Problem: Inadequate Gas Exchange  Goal: Patent Airway maintained  Outcome: Progressing  Flowsheets (Taken 08/05/2022 1255 by Wess Botts, RN)  Patent airway maintained:   Position patient for maximum ventilatory efficiency   Provide adequate fluid intake to liquefy secretions   Suction secretions as needed   Reinforce use of ordered respiratory interventions (i.e. CPAP, BiPAP, Incentive Spirometer, Acapella, etc.)   Reposition patient every 2 hours and as needed unless able to self-reposition  Goal: Adequate oxygenation and improved ventilation  Outcome:  Progressing  Flowsheets (Taken 07/29/2022 1755 by Lorriane Shire, RN)  Adequate oxygenation and improved ventilation:   Assess lung sounds   Monitor SpO2 and treat as needed   Provide mechanical and oxygen support to facilitate gas exchange   Monitor and treat ETCO2   Teach/reinforce use of incentive spirometer 10 times per hour while awake, cough and deep breath as needed   Position for  maximum ventilatory efficiency   Plan activities to conserve energy: plan rest periods   Consult/collaborate with Respiratory Therapy   Increase activity as tolerated/progressive mobility     Problem: Neurological Deficit  Goal: Neurological status is stable or improving  Outcome: Progressing     Problem: Potential for Aspiration  Goal: Risk of aspiration will be minimized  Outcome: Progressing     Problem: Every Day - Stroke  Goal: Mobility/Activity is maintained at optimal level for patient  Outcome: Progressing  Flowsheets (Taken 08/07/2022 0135 by Lucky Rathke, RN)  Mobility/activity is maintained at optimal level for patient: Encourage independent activity per ability  Goal: Elimination patterns are normal or improving  Outcome: Progressing  Flowsheets (Taken 07/29/2022 1755 by Lorriane Shire, RN)  Elimination patterns are normal or improving: Assess for and discuss C. diff screening with LIP  Goal: Neurological status is stable or improving  Outcome: Progressing  Goal: Core/Quality measure requirements - Daily  Outcome: Progressing  Goal: Stable vital signs and fluid balance  Outcome: Progressing  Goal: Patient will maintain adequate oxygenation  Outcome: Progressing  Goal: Patient's risk of aspiration will be minimized  Outcome: Progressing  Goal: Nutritional intake is adequate  Outcome: Progressing  Goal: Skin integrity is maintained or improved  Outcome: Progressing  Goal: Neurovascular status is stable or improving  Outcome: Progressing  Goal: Effective coping demonstrated  Outcome: Progressing  Goal: Will be able to  express needs and understand communication  Outcome: Progressing     Problem: Day of Discharge - Stroke  Goal: Able to express understanding of discharge instructions and stroke education  Outcome: Progressing  Goal: Core/Quality measures - Discharge  Outcome: Progressing     Problem: Pain interferes with ability to perform ADL  Goal: Pain at adequate level as identified by patient  Outcome: Progressing  Flowsheets (Taken 08/07/2022 0132 by Lucky Rathke, RN)  Pain at adequate level as identified by patient:   Identify patient comfort function goal   Assess pain on admission, during daily assessment and/or before any "as needed" intervention(s)   Reassess pain within 30-60 minutes of any procedure/intervention, per Pain Assessment, Intervention, Reassessment (AIR) Cycle   Evaluate if patient comfort function goal is met     Problem: Everyday - Heart Failure  Goal: Mobility/Activity is Maintained at Optimal Level for Patient  Outcome: Progressing  Goal: Nutritional Intake is Adequate  Outcome: Progressing   4 eyes in 4 hours pressure injury assessment note:      Completed with: [facility]  [address]            Bony Prominences: Check appropriate box; if wound is present enter wound assessment in LDA     Occiput:                 [x] WNL  []  Wound present  Face:                     [x] WNL  []  Wound present  Ears:                      [x] WNL  []  Wound present  Spine:                    [x] WNL  []  Wound present  Shoulders:             [x] WNL  []  Wound present  Elbows:                  [  x]WNL  []  Wound present  Sacrum/coccyx:     [] WNL  [x]  Wound present  Ischial Tuberosity:  [] WNL  [x]  Wound present  Trochanter/Hip:      [] WNL  [x]  Wound present  Knees:                   [x] WNL  []  Wound present  Ankles:                   [x] WNL  []  Wound present  Heels:                    [x] WNL  []  Wound present  Other pressure areas:  [x]  Wound location Abd       Device related: []  Device name:         LDA completed if wound  present: yes/no  Consult WOCN if necessary    Other skin related issues, ie tears, rash, etc, document in Integumentary flowsheet

## 2022-08-07 NOTE — Plan of Care (Signed)
Problem: Renal Instability  Goal: Fluid and electrolyte balance are achieved/maintained  Flowsheets (Taken 08/06/2022 1148 by Cecilio Asper, RN)  Fluid and electrolyte balance are achieved/maintained:   Monitor/assess lab values and report abnormal values   Assess and reassess fluid and electrolyte status   Observe for cardiac arrhythmias   Follow fluid restrictions/IV/PO parameters     Problem: Patient Receiving Advanced Renal Therapies  Goal: Therapy access site remains intact  Flowsheets (Taken 08/07/2022 0132 by Lucky Rathke, RN)  Therapy access site remains intact: Assess therapy access site

## 2022-08-07 NOTE — Progress Notes (Signed)
[facility]  Progress Note    08/07/2022   Shelby Barron GNF:[id number],[id number] is a 82 y.o. female,       Assessment:     Encephalopathy  Acute CVA  History of bladder cancer and renal failure  Status post radical cystectomy/bilateral nephrectomy, enterectomy and lysis of adhesion (06/28/2022)  S/p small bowel resection  Abdominal wall wound s/p bedside debridement/wound VAC placement (08/02/2022)  End-stage renal disease on hemodialysis  Atrial fibrillation  Risk of aspiration    Plan:     Continue monitoring without antibiotics  Wound care nurse following  Follow-up chest x-ray  Continue probiotics  Monitor electrolytes and renal functions closely  Monitor clinically    ROS:     General: No fever, awake, comfortable, confused  Respiratory: No cough or shortness of breath  Gastrointestinal: No abdominal pain  Genito-Urinary: no hematuria  Musculoskeletal: no edema  Neurological: awake  Dermatological: No rash    Physical Examination:     Blood pressure 112/62, pulse 77, temperature 98.4 F (36.9 C), temperature source Oral, resp. rate 18, height 1.575 m (5\' 2" ), weight 85.7 kg (188 lb 15 oz), SpO2 95%.    General Appearance: Appears comfortable  HEENT: Pupils are equal, round, and reactive to light.   Lungs: Clear to auscultation  Heart: Regular rate and rhythm  Chest: Symmetric chest wall expansion.   Abdomen: soft, no hepatosplenomegaly, no bowel sounds, abdominal wall wound VAC in place  Neurological: awake  Extremities: No edema    Laboratory And Diagnostic Studies:     Recent Labs     08/06/22  0504   WBC 13.91*   Hemoglobin 8.9*   Hematocrit 28.9*   Platelet Count 244     Recent Labs     08/06/22  0504   Sodium 141   Potassium 4.3   Chloride 103   CO2 27   BUN 27*   Creatinine 4.1*   Glucose 96   Calcium 8.4     Recent Labs     08/06/22  0504   AST (SGOT) 11   ALT 9   Alkaline Phosphatase 96   Protein, Total 4.7*   Albumin 1.6*         Current Meds:      Scheduled Meds: PRN  Meds:    acetaminophen, 650 mg, Oral, 4 times per day   Or  acetaminophen, 650 mg, Oral, 4 times per day  amiodarone, 200 mg, per NG tube, Daily  apixaban, 2.5 mg, Oral, Q12H SCH  atorvastatin, 40 mg, Oral, QHS  b complex-vitamin c-folic acid, 1 tablet, Oral, Daily  carvedilol, 12.5 mg, Oral, Q12H SCH  darbepoetin alfa, 0.45 mcg/kg, Subcutaneous, Weekly  glycopyrrolate, 0.2 mg, Intravenous, Q8H  insulin lispro, 1-3 Units, Subcutaneous, QHS  insulin lispro, 1-5 Units, Subcutaneous, TID AC  lidocaine, 20 mL, Intradermal, Once  pantoprazole, 40 mg, per NG tube, Q12H SCH  thiamine, 100 mg, per NG tube, Daily  umeclidinium-vilanterol, 1 puff, Inhalation, QAM        Continuous Infusions:       sodium chloride, , PRN  acetaminophen, 650 mg, Q6H PRN  albuterol-ipratropium, 3 mL, Q6H PRN  benzocaine, 1 spray, TID PRN  calcium carbonate, 500 mg, Q4H PRN  dextrose, 15 g of glucose, PRN   Or  dextrose, 12.5 g, PRN   Or  dextrose, 12.5 g, PRN   Or  glucagon (rDNA), 1 mg, PRN  diphenhydrAMINE, 25 mg, Q6H PRN  hydrALAZINE, 10 mg, Q3H PRN  HYDROmorphone, 1 mg, Q4H PRN  labetalol, 10 mg, Q15 Min PRN  lidocaine 2% jelly, , Once PRN  naloxone, 0.2 mg, PRN  ondansetron, 4 mg, Q6H PRN  oxyCODONE, 2.5 mg, Q4H PRN  phenol, 1 spray, Q2H PRN  sodium chloride, 100 mL, Q1H PRN  sodium chloride, 250 mL, PRN          Shelby Barron A. Janalyn Rouse, M.D.  08/07/2022  8:37 AM

## 2022-08-07 NOTE — Plan of Care (Signed)
NURSING SHIFT NOTE     Patient: Shelby Barron  Day: 40      SHIFT EVENTS     Shift Narrative/Significant Events (PRN med administration, fall, RRT, etc.):     Patient admitted for surgery for bladder cancer. Abdominal wound vac in place, scant drainage. Patient is unable to reposition, she is being turned. Wounds to buttocks treated with [facility]ad. Patient able to take PO medications crushed in applesauce. Patient expressed that she was in pain, responded well to IV Dilaudid. Midline dressing changed, central line dressing clean and intact. Patient's daughter updated over phone. Patient is anuric, HD scheduled T/T/S. Bedtime BG required no intervention.    Safety and fall precautions remain in place. Purposeful rounding completed.          ASSESSMENT     Changes in assessment from patient's baseline this shift:    Neuro: No  CV: No  Pulm: No  Peripheral Vascular: No  HEENT: No  GI: No  BM during shift: No, Last BM: Last BM Date: 08/03/22  GU: No   Integ: No  MS: No    Pain: No change  Pain Interventions: Medications  Medications Utilized: Dilaudid intravenous    Mobility: PMP Activity: Step 1 - Bedrest of Distance Walked (ft) (Step 6,7): 0 Feet           Lines     Patient Lines/Drains/Airways Status       Active Lines, Drains and Airways       Name Placement date Placement time Site Days    Permacath Catheter - Tunneled 07/14/22 Subclavian vein catheter Right 07/14/22  1404  -- 23    Midline IV 07/01/22 Anterior;Left Upper Arm 07/01/22  1315  Upper Arm  36                         VITAL SIGNS     Vitals:    08/06/22 2334   BP: 124/66   Pulse: 82   Resp: 16   Temp: 98.1 F (36.7 C)   SpO2: 94%       Temp  Min: 96.8 F (36 C)  Max: 99 F (37.2 C)  Pulse  Min: 72  Max: 94  Resp  Min: 16  Max: 18  BP  Min: 94/53  Max: 131/68  SpO2  Min: 93 %  Max: 99 %      Intake/Output Summary (Last 24 hours) at 08/07/2022 0135  Last data filed at 08/07/2022 0100  Gross per 24 hour   Intake 390 ml   Output 0 ml   Net 390 ml             CARE PLAN       Problem: Moderate/High Fall Risk Score >5  Goal: Patient will remain free of falls  Outcome: Progressing  Flowsheets (Taken 08/06/2022 2000)  High (Greater than 13):   HIGH-Visual cue at entrance to patient's room   HIGH-Bed alarm on at all times while patient in bed   HIGH-Apply yellow "Fall Risk" arm band   HIGH-Initiate use of floor mats as appropriate     Problem: Compromised Moisture  Goal: Moisture level Interventions  Outcome: Progressing  Flowsheets (Taken 08/06/2022 2000)  Moisture level Interventions: Moisture wicking products, Moisture barrier cream     Problem: Compromised Friction/Shear  Goal: Friction and Shear Interventions  Outcome: Progressing  Flowsheets (Taken 08/06/2022 2000)  Friction and Shear Interventions: Pad bony prominences, Off load heels, HOB  30 degrees or less unless contraindicated, Consider: TAP seated positioning, Heel foams     Problem: Impaired Mobility  Goal: Mobility/Activity is maintained at optimal level for patient  08/07/2022 0135 by Lucky Rathke, RN  Outcome: Progressing  Flowsheets (Taken 08/07/2022 0135)  Mobility/activity is maintained at optimal level for patient: Encourage independent activity per ability  08/07/2022 0132 by Lucky Rathke, RN  Outcome: Progressing  Flowsheets (Taken 08/07/2022 0132)  Mobility/activity is maintained at optimal level for patient: Encourage independent activity per ability     Problem: Pain  Goal: Pain at adequate level as identified by patient  Outcome: Progressing  Flowsheets (Taken 08/07/2022 0132)  Pain at adequate level as identified by patient:   Identify patient comfort function goal   Assess pain on admission, during daily assessment and/or before any "as needed" intervention(s)   Reassess pain within 30-60 minutes of any procedure/intervention, per Pain Assessment, Intervention, Reassessment (AIR) Cycle   Evaluate if patient comfort function goal is met     Problem: Patient Receiving Advanced Renal  Therapies  Goal: Therapy access site remains intact  Outcome: Progressing  Flowsheets (Taken 08/07/2022 0132)  Therapy access site remains intact: Assess therapy access site     Problem: Diabetes: Glucose Imbalance  Goal: Blood glucose stable at established goal  Outcome: Progressing  Flowsheets (Taken 08/07/2022 0132)  Blood glucose stable at established goal:   Monitor lab values   Include patient/family in decisions related to nutrition/dietary selections   Assess for hypoglycemia /hyperglycemia   Monitor/assess vital signs   Coordinate medication administration with meals, as indicated     Problem: Neurological Deficit  Goal: Neurological status is stable or improving  Outcome: Progressing  Flowsheets (Taken 08/07/2022 0132)  Neurological status is stable or improving: Perform CAM Assessment     Problem: Every Day - Stroke  Goal: Mobility/Activity is maintained at optimal level for patient  08/07/2022 0135 by Lucky Rathke, RN  Outcome: Progressing  Flowsheets (Taken 08/07/2022 0135)  Mobility/activity is maintained at optimal level for patient: Encourage independent activity per ability  08/07/2022 0132 by Lucky Rathke, RN  Outcome: Progressing  Flowsheets (Taken 08/07/2022 0132)  Mobility/activity is maintained at optimal level for patient: Encourage independent activity per ability  Goal: Neurological status is stable or improving  Outcome: Progressing  Flowsheets (Taken 08/07/2022 0132)  Neurological status is stable or improving: Perform CAM Assessment     Problem: Pain interferes with ability to perform ADL  Goal: Pain at adequate level as identified by patient  Outcome: Progressing  Flowsheets (Taken 08/07/2022 0132)  Pain at adequate level as identified by patient:   Identify patient comfort function goal   Assess pain on admission, during daily assessment and/or before any "as needed" intervention(s)   Reassess pain within 30-60 minutes of any procedure/intervention, per Pain Assessment,  Intervention, Reassessment (AIR) Cycle   Evaluate if patient comfort function goal is met

## 2022-08-07 NOTE — Progress Notes (Signed)
HD TX Completed x3.5hrs, 2.6L fluid removed, tolerated well, VS WDL, Report given to [facility] RN   08/07/22 1331   Treatment Summary   Time Off Machine 1325   Duration of Treatment (Hours) 3.5   Treatment Type 2:1   Dialyzer Clearance Moderately streaked   Fluid Volume Off (mL) 3000   Prime Volume (mL) 200   Rinseback Volume (mL) 200   Fluid Given: Normal Saline (mL) 0   Fluid Given: PRBC  0 mL   Fluid Given: Albumin (mL) 0   Fluid Given: Other (mL) 0   Total Fluid Given 400   Hemodialysis Net Fluid Removed 2600   Post Treatment Assessment   Post-Treatment Weight (Kg) -2.6   Patient Response to Treatment tolerated well   Additional Dialyzer Used 0   Permacath Catheter - Tunneled 07/14/22 Subclavian vein catheter Right   Placement Date/Time: 07/14/22 1404   Inserted by: Dr. Welton Flakes  Access Type: Subclavian vein catheter  Orientation: Right  Central Line Infection Prevention Education provided?: Yes  Hand Hygiene: Alcohol based hand scrub;Soap and water  Line cart u...   Line necessity reviewed? Apheresis/hemodialysis   Site Assessment Clean;Dry;Intact   Catheter Lumen Volume Venous 1.6 mL   Catheter Lumen Volume Arterial 1.6 mL   Dressing Status and Intervention Dressing Intact;Clean & Dry   Tego/Curos Caps on Catheter Yes   NEW Tego/Curos Caps placed (Date) 08/07/22   APHERESIS ONLY:  Access  Red   APHERESIS ONLY: Return Blue   End Caps Free From Blood Yes   Line Care Connections checked and tightened   Dressing Type Transparent;Biopatch   Dressing Status Clean;Dry;Intact   Dressing/Line Intervention Caps changed   Vitals   Temp 97.8 F (36.6 C)   Heart Rate 95   Resp Rate 18   BP 105/57   O2 Device None (Room air)   Assessment   Mental Status Oriented;Alert;Cooperative   Cardiac (WDL) WDL   Cardiac Regularity Irregular   Cardiac Symptoms Return to [facility]   Respiratory  WDL   Respiratory Pattern Return to WDL   Bilateral Breath Sounds Clear;Diminished   R Breath Sounds Diminished   L Breath Sounds Diminished   Edema   WDL   Generalized Edema Non Pitting Edema   Facial Return to WDL   RLE Edema Non Pitting Edema   LLE Edema Non Pitting Edema   General Skin Color Appropriate for ethnicity   Skin Temp Warm;Dry   Gastrointestinal (WDL) WDL   Abdomen Inspection Distended   Mobility Bed   Pain Assessment   Charting Type Reassessment   Pain Scale Used Numeric Scale (0-10)   Numeric Pain Scale   Pain Score 0   POSS Score 1   Multiple Pain Sites No   Education   Person taught Patient   Topics taught Procedure   Teaching Tools Explain   Reponse Unable to Teach Back (see Comments)  (Disoriented)   Bedside Nurse Communication   Name of bedside RN - post dialysis Cleotis Lema RN

## 2022-08-07 NOTE — Discharge Summary (Addendum)
[facility]      Patient: Shelby Barron  Admission Date: 06/28/2022   DOB: 11/04/1940  Discharge Date: 08/07/2022    MRN: [id number]  Discharge Attending:Jaylene Arrowood Laurine Blazer, MD     Referring Physician: Patsy Lager, MD  PCP: Patsy Lager, MD       DISCHARGE SUMMARY     Discharge Information   Admission Diagnosis:   Bladder cancer    Discharge Diagnosis:   Active Hospital Problems    Diagnosis    Anemia    COPD (chronic obstructive pulmonary disease)    Bladder cancer    ESRD (end stage renal disease)        Malnutrition Documentation    Moderate Malnutrition related to inadequate nutritional intake in the setting of acute illness as evidenced by <50% EER > 5 days, mild muscle losses (temporalis, deltoid) - new                Discharge Condition: stable   Consultants: surgery, ID, cardiology, nephro, palliative, GI and neurology   Discharged to: SNF peer to peer denied, [facility] pending dispo planning per CM likely home with [facility] Course   Presentation History   Per admission H&P: [name] is a 82 y.o. female with history of COPD, [name], type 2 diabetes, renal cell carcinoma status post partial bilateral nephrectomy, [name], new diagnosis of urothelial cancer.       She was recently admitted in May 2024 at which time she was transitioned from PD to HD.  At that time was diagnosed with urothelial cancer.  She presented today for scheduled cystectomy and completion nephrectomy with urology.  Her last Plavix dose was 6/3, her last dialysis was 6/8.  She had preop clearance with cardiology which included an echo.     In the OR course was complicated by  Small bowel enterotomy -General surgery called in enterotomy repaired with initial plan to put patient back into continuity     Later in the case significant bleeding during nephrectomy/cystectomy.  Approximately 1 L EBL.  Given significant bleeding decision made to leave patient in discontinuity and abdomen open with plans to return to the OR  tomorrow.    Hospital Course (40 Days)     Acute toxic metabolic Encephalopathy/delirium stable-improved  Possible underlying cognitive impairment  - On and off confusion alert oriented x 2, now relatively stable the last several days  - Patient was upgraded to ICU on 6/30 for lethargy and decreased responsiveness. CT head was with no acute finding.   - EEG consistent with encephalopathy no seizure, CVA management as below  - SLP advanced diet. NG tube removed 7/9.  - Palliative consulted:  Family was clear they would like no limits to care and patient remained full code.     Anemia of CKD and acute blood loss anemia  - TSAT 18%  - given Aranesp this admission per nephrology  - Hgb now stable. No signs of active bleeding.     S/p open cystectomy, bilateral nephrectomies, and resection of small bowel after intra-op enterotomy    Mixed shock - hemorrhagic vs septic: Resolved  Necrotic wound tissue  - 6/10 small bowel enteretomy with 1L EBL. Went to the OR on June 12 for small bowel anastomosis, closure of mesenteric defect, closure of abdominal wall with wound VAC application. Wound VAC last changed on 7/15.   - Will need outpatient wound care follow up.  -  Surgery consulted, CT abdomen with and without contrast did not show any anastomotic leak or abscess.   - Status post wound debridement by general surgery at bedside on 7/15 for necrotic wound tissue  - ID consulted, completed meropenem and Mycamine for 2 weeks on 7/3. No further abx recommended after bedside debridement on 7/15     Acute CVA   Petechial hemorrhage vs cortical laminar necrosis  - Stopped ASA and started Eliquis per neurology on 7/5 after >10 days after CVA. Continue statin therapy.  - Carotid Doppler showed plaque with no critical stenosis  - Head CT on 7/7 w/ a few areas of cortical hyperdensity - petechial hemorrhage vs cortical laminar necrosis. Discussed with neuro OK to continue eliquis for now. Repeat head CT on 7/8 stable.  - EEG  encephalopathic pattern no seizure, repeat MRI no new infarction  - PT OT recommend SNF on discharge     Acute hypoxemic respiratory failure resolved  COPD  - Extubated on 6/13, currently on room air  - X-ray repeated on 6/29 showed resolution of interstitial edema     ESRD  S/p CRRT this admission, now on HD TTS  Continue dialysis on routine basis as outpatient  IR removed the nonfunctional permacath and right Quinton IJ cath and replaced with a new permacath on 6/26.     New onset Afib with RVR, improved  - CHA2DS2-VASc score is 7.   - Status post amiodarone drip. Now stable on oral amiodarone and coreg per cardiology recommendations  - GI and general surgery cleared for anticoagulation. Started on Eliquis since 07/23/22 with neurology clearance after CVA     Renal cell carcinoma outpatient follow-up hematology oncology  Urothelial carcinoma             Progress Note/Physical Exam at Discharge     Subjective:Patient is stable for discharge.    Vitals:    08/07/22 0945 08/07/22 1000 08/07/22 1015 08/07/22 1030   BP: 119/56 96/48 121/56 97/53   Pulse: 70 78 77 76   Resp: 15      Temp: 98 F (36.7 C)      TempSrc:       SpO2:       Weight:       Height:           GEN APPEARANCE: Alert, awake and oriented x 2 (oriented to self and place)  HEENT: PERLA; EOMI; Conjunctiva Clear  NECK: Supple; No bruits  CVS: RRR, S1, S2; No M/G/R  LUNGS: CTAB; , no Rhonchi: No rales  ABD: Soft; No TTP; + Normoactive BS vertical abdominal wound and wound VAC in place  EXT: No edema; Pulses 2+ and intact  NEURO: moving right upper extremity spontaneously, minimal spont mvmt of LEs, no movement of left upper extremity       Diagnostics     Labs/Studies Pending at Discharge: No    Last Labs   Recent Labs   Lab 08/06/22  0504 08/04/22  0404 08/03/22  0637   WBC 13.91* 11.91* 13.17*   RBC 3.12* 2.93* 3.02*   Hemoglobin 8.9* 8.7* 8.8*   Hematocrit 28.9* 26.7* 27.6*   MCV 92.6 91.1 91.4   Platelet Count 244 263 266       Recent Labs   Lab  08/06/22  0504 08/04/22  0404 08/03/22  0637 08/02/22  0527 08/01/22  0320   Sodium 141 141 140 139 140   Potassium 4.3 4.2 4.9 4.5 4.3   Chloride  103 103 102 101 103   CO2 27 25 22 23 25    BUN 27* 25* 45* 34* 23*   Creatinine 4.1* 3.8* 5.8* 4.6* 3.2*   Glucose 96 94 82 99 94   Calcium 8.4 8.4 8.8 8.5 8.4   Magnesium  --  1.8 1.9 1.9 2.0       Microbiology Results (last 15 days)       ** No results found for the last 360 hours. **             Patient Instructions   Discharge Diet: renal diet heart healthy   Discharge Activity: as tolerated per PT    Follow Up Appointment:   Follow-up Information       [facility] [place]. Call.    Specialty: Gastroenterology  Why: please call to make f/u in 6 weeks.  Contact information:  [address]  [place] [address]  [phone]  Additional information:  Take [address] to [place], [place] [address] [place].  Turn right on [address] and then right again at [employer].  Destination will be on your right.               Alla German, MD Follow up.    Specialties: Nephrology, Internal Medicine  Contact information:  [address]  [place] [address]  [phone]               [facility] Follow up in 1 month(s).    Specialty: Cardiology  Contact information:  [address]  [place] [address]  [phone]             Marlaine Hind, MD Follow up in 1 week(s).    Specialty: Surgery  Contact information:  [address]  [place] [address]  [phone]               [facility] Follow up in 1 week(s).    Contact information:  [address]  [address]  [place] [address]                            Discharge Medications:     Medication List        START taking these medications      amiodarone 200 MG tablet  Commonly known as: PACERONE  Take 1 tablet (200 mg) by mouth daily This is a maintenance dose.     apixaban  2.5 MG  Commonly known as: ELIQUIS  Take 1 tablet (2.5 mg) by mouth every 12 (twelve) hours     b complex-vitamin c-folic acid 0.8 MG Tabs  Take 1 tablet by mouth daily  Start taking on: August 08, 2022     carvedilol 12.5 MG tablet  Commonly known as: COREG  Take 1 tablet (12.5 mg) by mouth every 12 (twelve) hours     oxyCODONE 5 MG immediate release tablet  Commonly known as: ROXICODONE  Take 0.5 tablets (2.5 mg) by mouth every 8 (eight) hours as needed for Pain  Replaces: oxyCODONE 5 MG capsule     pantoprazole 40 mg/20 mL Susp  Commonly known as: PROTONIX  Take 20 mLs (40 mg) by mouth every 12 (twelve) hours  Replaces: pantoprazole 40 MG tablet            CONTINUE taking these medications  Anoro Ellipta 62.5-25 MCG/ACT Aepb  Generic drug: umeclidinium-vilanterol     atorvastatin 40 MG tablet  Commonly known as: LIPITOR     cinacalcet 30 MG tablet  Commonly known as: SENSIPAR     conjugated estrogens vaginal cream  Commonly known as: PREMARIN     montelukast 10 MG tablet  Commonly known as: SINGULAIR     ondansetron 4 MG tablet  Commonly known as: Zofran  Take 1 tablet (4 mg) by mouth every 8 (eight) hours as needed for Nausea     oxybutynin XL 5 MG 24 hr tablet  Commonly known as: DITROPAN-XL     sevelamer carbonate 800 MG tablet  Commonly known as: RENVELA            STOP taking these medications      allopurinol 100 MG tablet  Commonly known as: ZYLOPRIM     AMBIEN PO     b complex vitamins capsule     clopidogrel 75 mg tablet  Commonly known as: PLAVIX     hyoscyamine 0.125 MG tablet  Commonly known as: LEVSIN     insulin aspart 100 UNIT/ML injection  Commonly known as: NovoLOG     metoprolol tartrate 25 MG tablet  Commonly known as: LOPRESSOR     oxyCODONE 5 MG capsule  Commonly known as: OXY-IR  Replaced by: oxyCODONE 5 MG immediate release tablet     pantoprazole 40 MG tablet  Commonly known as: PROTONIX  Replaced by: pantoprazole 40 mg/20 mL Susp     pregabalin 50 MG capsule  Commonly known as: LYRICA      torsemide 100 MG tablet  Commonly known as: DEMADEX     Tresiba 100 UNIT/ML Soln  Generic drug: Insulin Degludec               Where to Get Your Medications        You can get these medications from any pharmacy    Bring a paper prescription for each of these medications  oxyCODONE 5 MG immediate release tablet       Information about where to get these medications is not yet available    Ask your nurse or doctor about these medications  amiodarone 200 MG tablet  apixaban 2.5 MG  b complex-vitamin c-folic acid 0.8 MG Tabs  carvedilol 12.5 MG tablet  pantoprazole 40 mg/20 mL Susp          Time spent examining patient, discussing with patient/family regarding hospital course, chart review, reconciling medications and discharge planning: 45 minutes.    Signed,  Lennox Pippins, MD    10:46 AM 08/07/2022

## 2022-08-07 NOTE — Progress Notes (Signed)
08/07/22 1500   Volunteer Chaplain   Visit Type Follow-up   Source Chaplain Initiated   Present at Visit Patient   Spiritual Care Provided to patient   Reason for Visit Spiritual Support   Spiritual Care Interventions Provided reflective and compassionate listening;Provided prayer;Provided spiritual encouragement   Spiritual Care Outcomes Patient expressed appreciation of visit   Length of Visit 0-15 minutes   Follow-up No follow-up at this time

## 2022-08-08 LAB — CBC
Absolute nRBC: 0 10*3/uL (ref <=0.00)
Hematocrit: 27.9 % — ABNORMAL LOW (ref 34.7–43.7)
Hemoglobin: 8.7 g/dL — ABNORMAL LOW (ref 11.4–14.8)
MCH: 29 pg (ref 25.1–33.5)
MCHC: 31.2 g/dL — ABNORMAL LOW (ref 31.5–35.8)
MCV: 93 fL (ref 78.0–96.0)
MPV: 10.1 fL (ref 8.9–12.5)
Platelet Count: 237 10*3/uL (ref 142–346)
RBC: 3 10*6/uL — ABNORMAL LOW (ref 3.90–5.10)
RDW: 15 % (ref 11–15)
WBC: 12.97 10*3/uL — ABNORMAL HIGH (ref 3.10–9.50)
nRBC %: 0 /100 WBC (ref <=0.0)

## 2022-08-08 LAB — WHOLE BLOOD GLUCOSE POCT: Whole Blood Glucose POCT: 154 mg/dL — ABNORMAL HIGH (ref 70–100)

## 2022-08-08 LAB — COMPREHENSIVE METABOLIC PANEL
ALT: 8 U/L (ref 0–55)
AST (SGOT): 10 U/L (ref 5–41)
Albumin/Globulin Ratio: 0.5 — ABNORMAL LOW (ref 0.9–2.2)
Albumin: 1.7 g/dL — ABNORMAL LOW (ref 3.5–5.0)
Alkaline Phosphatase: 95 U/L (ref 37–117)
Anion Gap: 12 (ref 5.0–15.0)
BUN: 23 mg/dL — ABNORMAL HIGH (ref 7–21)
Bilirubin, Total: 0.3 mg/dL (ref 0.2–1.2)
CO2: 27 mEq/L (ref 17–29)
Calcium: 8.8 mg/dL (ref 7.9–10.2)
Chloride: 103 mEq/L (ref 99–111)
Creatinine: 3.9 mg/dL — ABNORMAL HIGH (ref 0.4–1.0)
GFR: 10.8 mL/min/{1.73_m2} — ABNORMAL LOW (ref >=60.0)
Globulin: 3.3 g/dL (ref 2.0–3.6)
Glucose: 115 mg/dL — ABNORMAL HIGH (ref 70–100)
Potassium: 3.8 mEq/L (ref 3.5–5.3)
Protein, Total: 5 g/dL — ABNORMAL LOW (ref 6.0–8.3)
Sodium: 142 mEq/L (ref 135–145)

## 2022-08-08 NOTE — Progress Notes (Signed)
Infectious Diseases & Tropical Medicine  Progress Note    08/08/2022   Kianni Lheureux ZOX:[id number] is a 82 y.o. female,       Assessment:     Encephalopathy  Acute CVA  History of bladder cancer and renal failure  Status post radical cystectomy/bilateral nephrectomy, enterectomy and lysis of adhesion (06/28/2022)  S/p small bowel resection  Abdominal wall wound s/p bedside debridement/wound VAC placement (08/02/2022)  End-stage renal disease on hemodialysis  Atrial fibrillation  Risk of aspiration    Plan:     Likely discharge today  Continue monitoring without antibiotics  Continue local wound care  Continue probiotics  Monitor electrolytes and renal functions closely  Monitor clinically    ROS:     General: No fever, awake, confused  Respiratory: No cough or shortness of breath  Gastrointestinal: No abdominal pain  Genito-Urinary: no hematuria  Musculoskeletal: no edema  Neurological: awake  Dermatological: No rash    Physical Examination:     Blood pressure 132/70, pulse 86, temperature 99 F (37.2 C), temperature source Oral, resp. rate 17, height 1.575 m (5\' 2" ), weight 86 kg (189 lb 9.5 oz), SpO2 99%.    General Appearance: Comfortable  HEENT: Pupils are equal, round, and reactive to light.   Lungs: Clear to auscultation  Heart: Regular rate and rhythm  Chest: Symmetric chest wall expansion.   Abdomen: soft, no hepatosplenomegaly, no bowel sounds, abdominal wall wound VAC in place  Neurological: awake  Extremities: No edema    Laboratory And Diagnostic Studies:     Recent Labs     08/08/22  0420 08/06/22  0504   WBC 12.97* 13.91*   Hemoglobin 8.7* 8.9*   Hematocrit 27.9* 28.9*   Platelet Count 237 244     Recent Labs     08/08/22  0420 08/06/22  0504   Sodium 142 141   Potassium 3.8 4.3   Chloride 103 103   CO2 27 27   BUN 23* 27*   Creatinine 3.9* 4.1*   Glucose 115* 96   Calcium 8.8 8.4     Recent Labs     08/08/22  0420 08/06/22  0504   AST (SGOT) 10 11   ALT 8 9   Alkaline Phosphatase 95 96    Protein, Total 5.0* 4.7*   Albumin 1.7* 1.6*         Current Meds:      Scheduled Meds: PRN Meds:    acetaminophen, 650 mg, Oral, 4 times per day   Or  acetaminophen, 650 mg, Oral, 4 times per day  amiodarone, 200 mg, per NG tube, Daily  apixaban, 2.5 mg, Oral, Q12H SCH  atorvastatin, 40 mg, Oral, QHS  b complex-vitamin c-folic acid, 1 tablet, Oral, Daily  carvedilol, 12.5 mg, Oral, Q12H SCH  darbepoetin alfa, 0.45 mcg/kg, Subcutaneous, Weekly  glycopyrrolate, 0.2 mg, Intravenous, Q8H  lidocaine, 20 mL, Intradermal, Once  pantoprazole, 40 mg, per NG tube, Q12H SCH  thiamine, 100 mg, per NG tube, Daily  umeclidinium-vilanterol, 1 puff, Inhalation, QAM        Continuous Infusions:       sodium chloride, , PRN  acetaminophen, 650 mg, Q6H PRN  albuterol-ipratropium, 3 mL, Q6H PRN  benzocaine, 1 spray, TID PRN  calcium carbonate, 500 mg, Q4H PRN  dextrose, 15 g of glucose, PRN   Or  dextrose, 12.5 g, PRN   Or  dextrose, 12.5 g, PRN   Or  glucagon (rDNA), 1 mg, PRN  diphenhydrAMINE, 25  mg, Q6H PRN  hydrALAZINE, 10 mg, Q3H PRN  HYDROmorphone, 1 mg, Q4H PRN  labetalol, 10 mg, Q15 Min PRN  lidocaine 2% jelly, , Once PRN  naloxone, 0.2 mg, PRN  ondansetron, 4 mg, Q6H PRN  oxyCODONE, 2.5 mg, Q4H PRN  phenol, 1 spray, Q2H PRN          Lashonta Pilling A. Janalyn Rouse, M.D.  08/08/2022  8:04 AM

## 2022-08-08 NOTE — Progress Notes (Signed)
D/C DELAY pending Home Plan or Private Pay in a SNF    CM made call to pt's daughter [name] and spoke with her at length regarding the P2P, denial, Home Plan, Private Pay in a SNF, Palliative vs Hospice.  Daughter is open to both Home and Private Pay at [facility].  Per daughter, she will evaluate how she can make space to accommodate a Hospital Bed in the Living Room, given her dad is already utilizing the space with a Bed that once accommodate both pt and her husband.  However, given pt inability to maneuver in the bed, pt would now need a Hospital Bed.      CM will make referral to Palliative Care for an Informational visit.  CM will continue to follow and coordinate all d/c needs.    Real Cons Joann Jorge MSW, [facility]  Care Manager   [facility]  [phone]

## 2022-08-08 NOTE — Nursing Progress Note (Addendum)
4 eyes in 4 hours pressure injury assessment note:      Completed with: Dahlia Client, PCT   Unit & Time admitted: Unit 23              Bony Prominences: Check appropriate box; if wound is present enter wound assessment in LDA     Occiput:                 [x] WNL  []  Wound present  Face:                     [x] WNL  []  Wound present  Ears:                      [x] WNL  []  Wound present  Spine:                    [x] WNL  []  Wound present  Shoulders:             [x] WNL  []  Wound present  Elbows:                  [x] WNL  []  Wound present  Sacrum/coccyx:     [] WNL  [x]  Wound present  Ischial Tuberosity:  [] WNL  [x]  Wound present  Trochanter/Hip:      [x] WNL  []  Wound present  Knees:                   [x] WNL  []  Wound present  Ankles:                   [x] WNL  []  Wound present  Heels:                    [x] WNL  []  Wound present  Other pressure areas:  []  Wound location       Device related: [x]  Device name: Wound Vac to abdomen         LDA completed if wound present: yes/no  Consult WOCN if necessary    Other skin related issues, ie tears, rash, etc, document in Integumentary flowsheet

## 2022-08-08 NOTE — Plan of Care (Signed)
NURSING SHIFT NOTE     Patient: Shelby Barron  Day: 84      SHIFT EVENTS     Shift Narrative/Significant Events (PRN med administration, fall, RRT, etc.):     Pt is A&Ox1 to self, on RA. Scheduled meds given. Meds given crushed with applesauce. PRN oxycodone given for pain. Pt repositioned every 2 hours. SCDs in place with bilateral boots. Wound vac in place to abdomen. Wound. Skin care completed.Bed in lowest position with fall bundle in place. [name] updated on pt's care. Plan of care ongoing.     Safety and fall precautions remain in place. Purposeful rounding completed.          ASSESSMENT     Changes in assessment from patient's baseline this shift:    Neuro: No  CV: No  Pulm: No  Peripheral Vascular: No  HEENT: No  GI: No  BM during shift: No, Last BM Date: 08/06/22  GU: No   Integ: No  MS: No    Pain: Improved  Pain Interventions: Medications  Medications Utilized: Oxycodone, oral     Mobility: PMP Activity: Step 3 - Bed Mobility of Distance Walked (ft) (Step 6,7): 0 Feet           Lines     Patient Lines/Drains/Airways Status       Active Lines, Drains and Airways       Name Placement date Placement time Site Days    Permacath Catheter - Tunneled 07/14/22 Subclavian vein catheter Right 07/14/22  1404  -- 24    Midline IV 07/01/22 Anterior;Left Upper Arm 07/01/22  1315  Upper Arm  37                       VITAL SIGNS     Vitals:    08/08/22 0421   BP: 127/60   Pulse: 85   Resp: 16   Temp: 97 F (36.1 C)   SpO2: 99%       Temp  Min: 97 F (36.1 C)  Max: 98.8 F (37.1 C)  Pulse  Min: 70  Max: 97  Resp  Min: 15  Max: 18  BP  Min: 83/58  Max: 127/60  SpO2  Min: 95 %  Max: 99 %      Intake/Output Summary (Last 24 hours) at 08/08/2022 0506  Last data filed at 08/07/2022 1700  Gross per 24 hour   Intake 200 ml   Output 2600 ml   Net -2400 ml          CARE PLAN     Problem: Moderate/High Fall Risk Score >5  Goal: Patient will remain free of falls  Flowsheets (Taken 08/07/2022 1935)  High (Greater than 13):    HIGH-Visual cue at entrance to patient's room   HIGH-Bed alarm on at all times while patient in bed   HIGH-Apply yellow "Fall Risk" arm band   HIGH-Initiate use of floor mats as appropriate   HIGH-Consider use of low bed     Problem: Compromised Sensory Perception  Goal: Sensory Perception Interventions  Flowsheets (Taken 08/07/2022 2200)  Sensory Perception Interventions: Offload heels, Pad bony prominences, Reposition q 2hrs/turn Clock, Q2 hour skin assessment under devices if present     Problem: Compromised Moisture  Goal: Moisture level Interventions  Flowsheets (Taken 08/07/2022 2200)  Moisture level Interventions: Moisture wicking products, Moisture barrier cream     Problem: Compromised Activity/Mobility  Goal: Activity/Mobility Interventions  Flowsheets (Taken 08/07/2022  2200)  Activity/Mobility Interventions: Pad bony prominences, TAP Seated positioning system when OOB, Promote PMP, Reposition q 2 hrs / turn clock, Offload heels     Problem: Compromised Friction/Shear  Goal: Friction and Shear Interventions  Flowsheets (Taken 08/07/2022 2200)  Friction and Shear Interventions: Pad bony prominences, Off load heels, HOB 30 degrees or less unless contraindicated, Consider: TAP seated positioning, Heel foams     Problem: Infection Prevention  Goal: Free from infection  Flowsheets (Taken 08/05/2022 1255 by Wess Botts, RN)  Free from infection:   Monitor/assess vital signs   Encourage/assist patient to turn, cough and perform deep breathing every 2 hours   Assess incision for evidence of healing   Monitor/assess output from surgical drain if present   Assess for signs and symptoms of infection   Monitor/assess lab values and report abnormal values     Problem: Impaired Mobility  Goal: Mobility/Activity is maintained at optimal level for patient  Flowsheets (Taken 08/07/2022 0135 by Lucky Rathke, RN)  Mobility/activity is maintained at optimal level for patient: Encourage independent activity per  ability     Problem: Constipation  Goal: Fluid and electrolyte balance are achieved/maintained  Flowsheets (Taken 08/06/2022 1148 by Cecilio Asper, RN)  Fluid and electrolyte balance are achieved/maintained:   Monitor/assess lab values and report abnormal values   Assess and reassess fluid and electrolyte status   Observe for cardiac arrhythmias   Monitor for muscle weakness     Problem: Safety  Goal: Patient will be free from injury during hospitalization  Flowsheets (Taken 08/05/2022 1255 by Wess Botts, RN)  Patient will be free from injury during hospitalization:   Assess patient's risk for falls and implement fall prevention plan of care per policy   Use appropriate transfer methods   Provide and maintain safe environment   Include patient/ family/ care giver in decisions related to safety   Ensure appropriate safety devices are available at the bedside   Hourly rounding   Assess for patients risk for elopement and implement Elopement Risk Plan per policy   Provide alternative method of communication if needed (communication boards, writing)  Goal: Patient will be free from infection during hospitalization  Flowsheets (Taken 08/05/2022 1255 by Wess Botts, RN)  Free from Infection during hospitalization:   Monitor lab/diagnostic results   Monitor all insertion sites (i.e. indwelling lines, tubes, urinary catheters, and drains)   Assess and monitor for signs and symptoms of infection   Encourage patient and family to use good hand hygiene technique     Problem: Pain  Goal: Pain at adequate level as identified by patient  Flowsheets (Taken 08/07/2022 0132 by Lucky Rathke, RN)  Pain at adequate level as identified by patient:   Identify patient comfort function goal   Assess pain on admission, during daily assessment and/or before any "as needed" intervention(s)   Reassess pain within 30-60 minutes of any procedure/intervention, per Pain Assessment, Intervention, Reassessment (AIR) Cycle   Evaluate  if patient comfort function goal is met     Problem: Side Effects from Pain Analgesia  Goal: Patient will experience minimal side effects of analgesic therapy  Flowsheets (Taken 07/31/2022 0618 by Connye Burkitt, RN)  Patient will experience minimal side effects of analgesic therapy: Assess for changes in cognitive function     Problem: Hemodynamic Status: Cardiac  Goal: Stable vital signs and fluid balance  Flowsheets (Taken 08/06/2022 1148 by Cecilio Asper, RN)  Stable vital signs and fluid balance:   Assess signs and symptoms associated with  cardiac rhythm changes   Monitor lab values     Problem: Renal Instability  Goal: Fluid and electrolyte balance are achieved/maintained  Flowsheets (Taken 08/06/2022 1148 by Cecilio Asper, RN)  Fluid and electrolyte balance are achieved/maintained:   Monitor/assess lab values and report abnormal values   Assess and reassess fluid and electrolyte status   Observe for cardiac arrhythmias   Follow fluid restrictions/IV/PO parameters     Problem: Fluid and Electrolyte Imbalance/ Endocrine  Goal: Fluid and electrolyte balance are achieved/maintained  Flowsheets (Taken 08/06/2022 1148 by Cecilio Asper, RN)  Fluid and electrolyte balance are achieved/maintained:   Monitor/assess lab values and report abnormal values   Assess and reassess fluid and electrolyte status   Observe for cardiac arrhythmias   Monitor for muscle weakness     Problem: Diabetes: Glucose Imbalance  Goal: Blood glucose stable at established goal  Flowsheets (Taken 08/07/2022 0132 by Lucky Rathke, RN)  Blood glucose stable at established goal:   Monitor lab values   Include patient/family in decisions related to nutrition/dietary selections   Assess for hypoglycemia /hyperglycemia   Monitor/assess vital signs   Coordinate medication administration with meals, as indicated     Problem: Neurological Deficit  Goal: Neurological status is stable or improving  Flowsheets (Taken 08/07/2022 0132 by Lucky Rathke, RN)  Neurological status is stable or improving: Perform CAM Assessment     Problem: Every Day - Stroke  Goal: Mobility/Activity is maintained at optimal level for patient  Flowsheets (Taken 08/07/2022 0135 by Lucky Rathke, RN)  Mobility/activity is maintained at optimal level for patient: Encourage independent activity per ability  Goal: Neurological status is stable or improving  Flowsheets (Taken 08/07/2022 0132 by Lucky Rathke, RN)  Neurological status is stable or improving: Perform CAM Assessment     Problem: Pain interferes with ability to perform ADL  Goal: Pain at adequate level as identified by patient  Flowsheets (Taken 08/07/2022 0132 by Lucky Rathke, RN)  Pain at adequate level as identified by patient:   Identify patient comfort function goal   Assess pain on admission, during daily assessment and/or before any "as needed" intervention(s)   Reassess pain within 30-60 minutes of any procedure/intervention, per Pain Assessment, Intervention, Reassessment (AIR) Cycle   Evaluate if patient comfort function goal is met

## 2022-08-08 NOTE — Progress Notes (Signed)
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 s/p bilateral radical nephrectomies and  cystectomy on 06/28/22  - complicated by small bowel enterotomy and blood loss   New onset afib - on amiodarone and anticoagulation  High grade urothelial bladder CA: s/p bilateral nephrectomies and cystectomy c/b mall bowel enterotomy and blood loss (~1L) on 06/28/22  - awaiting wound debridement   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  Does not respond to my questions.  Appears comfortable    Review of Systems:   Unobtainable    Physical Exam:     Vitals:    08/07/22 2300 08/08/22 0412 08/08/22 0421 08/08/22 0500   BP: 110/68 123/50 127/60    Pulse: 80 89 85    Resp:   16    Temp: 97 F (36.1 C)  97 F (36.1 C)    TempSrc: Oral  Oral    SpO2: 97%  99%    Weight:    86 kg (189 lb 9.5 oz)   Height:           Intake and Output Summary (Last 24 hours) at Date Time    Intake/Output Summary (Last 24 hours) at 08/08/2022 0737  Last data filed at 08/08/2022 2956  Gross per 24 hour   Intake 260 ml   Output 2600 ml   Net -2340 ml         General: awake, no acute  distress  Cardiovascular: regular rate and rhythm  Lungs: CTA B anteriorly without rhonchi or wheezing  Abdomen: soft  Extremities: no edema, heel protectors in place  Access: R permacath     Meds:      Scheduled Meds: PRN Meds:    acetaminophen, 650 mg, Oral, 4 times per day   Or  acetaminophen, 650 mg, Oral, 4 times per day  amiodarone, 200 mg, per NG tube, Daily  apixaban, 2.5 mg, Oral, Q12H SCH  atorvastatin, 40 mg, Oral, QHS  b complex-vitamin c-folic acid, 1 tablet, Oral, Daily  carvedilol, 12.5 mg, Oral, Q12H SCH  darbepoetin alfa, 0.45 mcg/kg, Subcutaneous, Weekly  glycopyrrolate, 0.2 mg, Intravenous, Q8H  lidocaine, 20 mL, Intradermal, Once  pantoprazole, 40 mg, per NG tube, Q12H SCH  thiamine, 100 mg, per NG tube, Daily  umeclidinium-vilanterol, 1 puff, Inhalation, QAM          Continuous Infusions:         sodium chloride, , PRN  acetaminophen, 650 mg, Q6H PRN  albuterol-ipratropium,  3 mL, Q6H PRN  benzocaine, 1 spray, TID PRN  calcium carbonate, 500 mg, Q4H PRN  dextrose, 15 g of glucose, PRN   Or  dextrose, 12.5 g, PRN   Or  dextrose, 12.5 g, PRN   Or  glucagon (rDNA), 1 mg, PRN  diphenhydrAMINE, 25 mg, Q6H PRN  hydrALAZINE, 10 mg, Q3H PRN  HYDROmorphone, 1 mg, Q4H PRN  labetalol, 10 mg, Q15 Min PRN  lidocaine 2% jelly, , Once PRN  naloxone, 0.2 mg, PRN  ondansetron, 4 mg, Q6H PRN  oxyCODONE, 2.5 mg, Q4H PRN  phenol, 1 spray, Q2H PRN              Labs:     Recent Labs   Lab 08/08/22  0420 08/06/22  0504 08/04/22  0404   WBC 12.97* 13.91* 11.91*   Hemoglobin 8.7* 8.9* 8.7*   Hematocrit 27.9* 28.9* 26.7*   Platelet Count 237 244 263     Recent Labs   Lab 08/08/22  0420 08/06/22  0504 08/04/22  0404 08/03/22  0637 08/02/22  0527   Sodium 142 141 141 140 139   Potassium 3.8 4.3 4.2 4.9 4.5   Chloride 103 103 103 102 101   CO2 27 27 25 22 23    BUN 23* 27* 25* 45* 34*   Creatinine 3.9* 4.1* 3.8* 5.8* 4.6*   Calcium 8.8 8.4 8.4 8.8 8.5   Albumin 1.7* 1.6* 1.6* 1.6* 1.6*   Phosphorus  --   --  3.6 4.7 3.7    Magnesium  --   --  1.8 1.9 1.9   Glucose 115* 96 94 82 99   GFR 10.8* 10.2* 11.1* 6.7* 8.8*           Signed by: Glenard Haring, MD

## 2022-08-08 NOTE — Plan of Care (Signed)
NURSING SHIFT NOTE     Patient: Shelby Barron  Day: 03      SHIFT EVENTS     Shift Narrative/Significant Events (PRN med administration, fall, RRT, etc.):     A&Ox1, no distress noted. Patient scored for moderate pain on pain scale. Pt was given PRN pain medication along with scheduled pain medication in an effort to alleviate pain. Patient was turned Q2 during shift. Will continue to follow plan of care.    Safety and fall precautions remain in place. Purposeful rounding completed.          ASSESSMENT     Changes in assessment from patient's baseline this shift:    Neuro: No  CV: No  Pulm: No  Peripheral Vascular: No  HEENT: No  GI: No  BM during shift: No, Last BM: Last BM Date: 08/06/22  GU: No   Integ: No  MS: No        Mobility: PMP Activity: Step 3 - Bed Mobility of Distance Walked (ft) (Step 6,7): 0 Feet           Lines     Patient Lines/Drains/Airways Status       Active Lines, Drains and Airways       Name Placement date Placement time Site Days    Permacath Catheter - Tunneled 07/14/22 Subclavian vein catheter Right 07/14/22  1404  -- 25    Midline IV 07/01/22 Anterior;Left Upper Arm 07/01/22  1315  Upper Arm  38                         VITAL SIGNS     Vitals:    08/08/22 1615   BP: 112/67   Pulse: 78   Resp: 18   Temp: 97.7 F (36.5 C)   SpO2: 96%       Temp  Min: 97 F (36.1 C)  Max: 99 F (37.2 C)  Pulse  Min: 78  Max: 92  Resp  Min: 16  Max: 18  BP  Min: 104/62  Max: 132/70  SpO2  Min: 96 %  Max: 99 %      Intake/Output Summary (Last 24 hours) at 08/08/2022 1807  Last data filed at 08/08/2022 1302  Gross per 24 hour   Intake 440 ml   Output --   Net 440 ml                  CARE PLAN        Problem: Moderate/High Fall Risk Score >5  Goal: Patient will remain free of falls  Outcome: Progressing  Flowsheets (Taken 08/08/2022 1803)  High (Greater than 13):   HIGH-Initiate use of floor mats as appropriate   HIGH-Apply yellow "Fall Risk" arm band   HIGH-Bed alarm on at all times while patient in bed    HIGH-Consider use of low bed   HIGH-Visual cue at entrance to patient's room     Problem: Compromised Sensory Perception  Goal: Sensory Perception Interventions  Outcome: Progressing  Flowsheets (Taken 08/08/2022 1803)  Sensory Perception Interventions: Offload heels, Pad bony prominences, Reposition q 2hrs/turn Clock, Q2 hour skin assessment under devices if present     Problem: Compromised Moisture  Goal: Moisture level Interventions  Outcome: Progressing  Flowsheets (Taken 08/08/2022 1803)  Moisture level Interventions: Moisture wicking products, Moisture barrier cream     Problem: Compromised Activity/Mobility  Goal: Activity/Mobility Interventions  Outcome: Progressing  Flowsheets (Taken 08/08/2022 1803)  Activity/Mobility  Interventions: Pad bony prominences, TAP Seated positioning system when OOB, Promote PMP, Reposition q 2 hrs / turn clock, Offload heels     Problem: Compromised Nutrition  Goal: Nutrition Interventions  Outcome: Progressing  Flowsheets (Taken 08/08/2022 1803)  Nutrition Interventions: Discuss nutrition at Rounds, I&Os, Document % meal eaten, Daily weights     Problem: Compromised Friction/Shear  Goal: Friction and Shear Interventions  Outcome: Progressing  Flowsheets (Taken 08/08/2022 1803)  Friction and Shear Interventions: Pad bony prominences, Off load heels, HOB 30 degrees or less unless contraindicated, Consider: TAP seated positioning, Heel foams     Problem: Inadequate Airway Clearance  Goal: Normal respiratory rate/effort achieved/maintained  Outcome: Progressing  Flowsheets (Taken 08/05/2022 1255)  Normal respiratory rate/effort achieved/maintained: Plan activities to conserve energy: plan rest periods     Problem: Infection Prevention  Goal: Free from infection  Outcome: Progressing  Flowsheets (Taken 08/05/2022 1255)  Free from infection:   Monitor/assess vital signs   Encourage/assist patient to turn, cough and perform deep breathing every 2 hours   Assess incision for evidence of  healing   Monitor/assess output from surgical drain if present   Assess for signs and symptoms of infection   Monitor/assess lab values and report abnormal values     Problem: Constipation  Goal: Fluid and electrolyte balance are achieved/maintained  Outcome: Progressing  Flowsheets (Taken 08/08/2022 1803)  Fluid and electrolyte balance are achieved/maintained:   Monitor/assess lab values and report abnormal values   Assess and reassess fluid and electrolyte status   Observe for cardiac arrhythmias   Monitor for muscle weakness     Problem: Safety  Goal: Patient will be free from injury during hospitalization  Outcome: Progressing  Flowsheets (Taken 08/05/2022 1255)  Patient will be free from injury during hospitalization:   Assess patient's risk for falls and implement fall prevention plan of care per policy   Use appropriate transfer methods   Provide and maintain safe environment   Include patient/ family/ care giver in decisions related to safety   Ensure appropriate safety devices are available at the bedside   Hourly rounding   Assess for patients risk for elopement and implement Elopement Risk Plan per policy   Provide alternative method of communication if needed (communication boards, writing)  Goal: Patient will be free from infection during hospitalization  Outcome: Progressing  Flowsheets (Taken 08/05/2022 1255)  Free from Infection during hospitalization:   Monitor lab/diagnostic results   Monitor all insertion sites (i.e. indwelling lines, tubes, urinary catheters, and drains)   Assess and monitor for signs and symptoms of infection   Encourage patient and family to use good hand hygiene technique     Problem: Pain  Goal: Pain at adequate level as identified by patient  Outcome: Progressing  Flowsheets (Taken 08/08/2022 1803)  Pain at adequate level as identified by patient:   Identify patient comfort function goal   Assess for risk of opioid induced respiratory depression, including snoring/sleep apnea.  Alert healthcare team of risk factors identified.   Assess pain on admission, during daily assessment and/or before any "as needed" intervention(s)   Reassess pain within 30-60 minutes of any procedure/intervention, per Pain Assessment, Intervention, Reassessment (AIR) Cycle   Evaluate if patient comfort function goal is met   Evaluate patient's satisfaction with pain management progress   Offer non-pharmacological pain management interventions   Consult/collaborate with Pain Service   Consult/collaborate with Physical Therapy, Occupational Therapy, and/or Speech Therapy     Problem: Side Effects from Pain Analgesia  Goal: Patient will experience minimal side effects of analgesic therapy  Outcome: Progressing  Flowsheets (Taken 08/08/2022 1803)  Patient will experience minimal side effects of analgesic therapy:   Monitor/assess patient's respiratory status (RR depth, effort, breath sounds)   Prevent/manage side effects per LIP orders (i.e. nausea, vomiting, pruritus, constipation, urinary retention, etc.)   Assess for changes in cognitive function   Evaluate for opioid-induced sedation with appropriate assessment tool (i.e. POSS)     Problem: Hemodynamic Status: Cardiac  Goal: Stable vital signs and fluid balance  Outcome: Progressing  Flowsheets (Taken 08/08/2022 1803)  Stable vital signs and fluid balance:   Assess signs and symptoms associated with cardiac rhythm changes   Monitor lab values     Problem: Renal Instability  Goal: Fluid and electrolyte balance are achieved/maintained  Outcome: Progressing  Flowsheets (Taken 08/08/2022 1803)  Fluid and electrolyte balance are achieved/maintained:   Monitor/assess lab values and report abnormal values   Assess and reassess fluid and electrolyte status   Monitor for muscle weakness   Follow fluid restrictions/IV/PO parameters     Problem: Patient Receiving Advanced Renal Therapies  Goal: Therapy access site remains intact  Outcome: Progressing  Flowsheets (Taken  08/08/2022 1803)  Therapy access site remains intact:   Assess therapy access site   Change therapy access site dressing as needed     Problem: Fluid and Electrolyte Imbalance/ Endocrine  Goal: Fluid and electrolyte balance are achieved/maintained  Outcome: Progressing  Flowsheets (Taken 08/08/2022 1803)  Fluid and electrolyte balance are achieved/maintained:   Monitor/assess lab values and report abnormal values   Assess and reassess fluid and electrolyte status   Observe for cardiac arrhythmias   Monitor for muscle weakness  Goal: Adequate hydration  Outcome: Progressing  Flowsheets (Taken 08/05/2022 1255)  Adequate hydration:   Assess mucus membranes, skin color, turgor, perfusion and presence of edema   Assess for peripheral, sacral, periorbital and abdominal edema   Monitor and assess vital signs and perfusion     Problem: Diabetes: Glucose Imbalance  Goal: Blood glucose stable at established goal  Outcome: Progressing  Flowsheets (Taken 08/08/2022 1803)  Blood glucose stable at established goal:   Monitor lab values   Monitor intake and output.  Notify LIP if urine output is < 30 mL/hour.   Follow fluid restrictions/IV/PO parameters   Include patient/family in decisions related to nutrition/dietary selections   Assess for hypoglycemia /hyperglycemia   Coordinate medication administration with meals, as indicated   Monitor/assess vital signs   Ensure appropriate diet and assess tolerance   Ensure adequate hydration   Ensure appropriate consults are obtained (Nutrition, Diabetes Education, and Case Management)   Ensure patient/family has adequate teaching materials     Problem: Potential for Aspiration  Goal: Risk of aspiration will be minimized  Outcome: Progressing     Problem: Day of Discharge - Stroke  Goal: Able to express understanding of discharge instructions and stroke education  Outcome: Progressing  Flowsheets (Taken 08/08/2022 1803)  Able to express understanding of discharge instructions and stroke  education:   Provide alternative method of communication if needed   Consult/collaborate with Case Management/Social Work   Include patient care companion in decisions related to communication   Patient/patient care companion demonstrates understanding on disease process, treatment plan, medications and discharge plan   Ensure "Discharge instructions: Stroke" appears on discharge paperwork  (After Visit Summary-AVS)     Problem: Pain interferes with ability to perform ADL  Goal: Pain at adequate level as identified by  patient  Outcome: Progressing  Flowsheets (Taken 08/08/2022 1803)  Pain at adequate level as identified by patient:   Identify patient comfort function goal   Assess for risk of opioid induced respiratory depression, including snoring/sleep apnea. Alert healthcare team of risk factors identified.   Assess pain on admission, during daily assessment and/or before any "as needed" intervention(s)   Reassess pain within 30-60 minutes of any procedure/intervention, per Pain Assessment, Intervention, Reassessment (AIR) Cycle   Evaluate if patient comfort function goal is met   Evaluate patient's satisfaction with pain management progress   Offer non-pharmacological pain management interventions   Consult/collaborate with Pain Service   Consult/collaborate with Physical Therapy, Occupational Therapy, and/or Speech Therapy     Problem: Everyday - Heart Failure  Goal: Mobility/Activity is Maintained at Optimal Level for Patient  Outcome: Progressing  Flowsheets (Taken 08/08/2022 1803)  Mobility/Activity is Maintained at Optimal Level for Patient:   Increase mobility as tolerated/progressive mobility protocol   Maintain SCD's as Ordered   Perform active/passive ROM   Reposition patient every 2 hours and as needed unless able to reposition self   Assess for changes in respiratory status, level of consciousness and/or development of fatigue  Goal: Nutritional Intake is Adequate  Outcome: Progressing  Flowsheets  (Taken 08/05/2022 1255)  Nutritional Intake is Adequate:   Fluid Restricction if needed   Consult/Collaborate with Nutritionist   Patient and family teaching on low sodium diet   Cardiac diet-2 gm Sodium   Assess appetite,anorexia and amount of meal/food tolerated   Encourage/perform oral hygiene as appropriate

## 2022-08-08 NOTE — Discharge Summary (Signed)
[facility]      Patient: Shelby Barron  Admission Date: 06/28/2022   DOB: September 04, 1940  Discharge Date: 08/08/2022    MRN: [id number]  Discharge Attending:John Vasconcelos Laurine Blazer, MD     Referring Physician: Patsy Lager, MD  PCP: Patsy Lager, MD       DISCHARGE SUMMARY     Discharge Information   Admission Diagnosis:   Bladder cancer    Discharge Diagnosis:   Active Hospital Problems    Diagnosis    Anemia    COPD (chronic obstructive pulmonary disease)    Bladder cancer    ESRD (end stage renal disease)        Malnutrition Documentation    Moderate Malnutrition related to inadequate nutritional intake in the setting of acute illness as evidenced by <50% EER > 5 days, mild muscle losses (temporalis, deltoid) - new                Discharge Condition: stable   Consultants: surgery, ID, cardiology, nephro, palliative, GI and neurology   Discharged to: SNF peer to peer denied, [place] pending dispo planning per CM likely home with [facility]   Presentation History   Per admission H&P: [name] is a 82 y.o. female with history of COPD, [name], type 2 diabetes, renal cell carcinoma status post partial bilateral nephrectomy, [name], new diagnosis of urothelial cancer.       She was recently admitted in May 2024 at which time she was transitioned from PD to HD.  At that time was diagnosed with urothelial cancer.  She presented today for scheduled cystectomy and completion nephrectomy with urology.  Her last Plavix dose was 6/3, her last dialysis was 6/8.  She had preop clearance with cardiology which included an echo.     In the OR course was complicated by  Small bowel enterotomy -General surgery called in enterotomy repaired with initial plan to put patient back into continuity     Later in the case significant bleeding during nephrectomy/cystectomy.  Approximately 1 L EBL.  Given significant bleeding decision made to leave patient in discontinuity and abdomen open with plans to return to the OR  tomorrow.    Hospital Course (41 Days)     Acute toxic metabolic Encephalopathy/delirium stable-improved  Possible underlying cognitive impairment  - On and off confusion alert oriented x 2, now relatively stable the last several days  - Patient was upgraded to ICU on 6/30 for lethargy and decreased responsiveness. CT head was with no acute finding.   - EEG consistent with encephalopathy no seizure, CVA management as below  - SLP advanced diet. NG tube removed 7/9.  - Palliative consulted:  Family was clear they would like no limits to care and patient remained full code.     Anemia of CKD and acute blood loss anemia  - TSAT 18%  - given Aranesp this admission per nephrology  - Hgb now stable. No signs of active bleeding.     S/p open cystectomy, bilateral nephrectomies, and resection of small bowel after intra-op enterotomy    Mixed shock - hemorrhagic vs septic: Resolved  Necrotic wound tissue  - 6/10 small bowel enteretomy with 1L EBL. Went to the OR on June 12 for small bowel anastomosis, closure of mesenteric defect, closure of abdominal wall with wound VAC application. Wound VAC last changed on 7/15.   - Will need outpatient wound care follow up.  -  Surgery consulted, CT abdomen with and without contrast did not show any anastomotic leak or abscess.   - Status post wound debridement by general surgery at bedside on 7/15 for necrotic wound tissue  - ID consulted, completed meropenem and Mycamine for 2 weeks on 7/3. No further abx recommended after bedside debridement on 7/15     Acute CVA   Petechial hemorrhage vs cortical laminar necrosis  - Stopped ASA and started Eliquis per neurology on 7/5 after >10 days after CVA. Continue statin therapy.  - Carotid Doppler showed plaque with no critical stenosis  - Head CT on 7/7 w/ a few areas of cortical hyperdensity - petechial hemorrhage vs cortical laminar necrosis. Discussed with neuro OK to continue eliquis for now. Repeat head CT on 7/8 stable.  - EEG  encephalopathic pattern no seizure, repeat MRI no new infarction  - PT OT recommend SNF on discharge     Acute hypoxemic respiratory failure resolved  COPD  - Extubated on 6/13, currently on room air  - X-ray repeated on 6/29 showed resolution of interstitial edema     ESRD  S/p CRRT this admission, now on HD TTS  Continue dialysis on routine basis as outpatient  IR removed the nonfunctional permacath and right [place] IJ cath and replaced with a new permacath on 6/26.     New onset Afib with RVR, improved  - CHA2DS2-VASc score is 7.   - Status post amiodarone drip. Now stable on oral amiodarone and coreg per cardiology recommendations  - GI and general surgery cleared for anticoagulation. Started on Eliquis since 07/23/22 with neurology clearance after CVA     Renal cell carcinoma outpatient follow-up hematology oncology  Urothelial carcinoma             Progress Note/Physical Exam at Discharge     Subjective:Patient is stable for discharge.    Vitals:    08/08/22 0412 08/08/22 0421 08/08/22 0500 08/08/22 0739   BP: 123/50 127/60  132/70   Pulse: 89 85  86   Resp:  16  17   Temp:  97 F (36.1 C)  99 F (37.2 C)   TempSrc:  Oral  Oral   SpO2:  99%  99%   Weight:   86 kg (189 lb 9.5 oz)    Height:           GEN APPEARANCE: Alert, awake and oriented x 2 (oriented to self and place)  HEENT: PERLA; EOMI; Conjunctiva Clear  NECK: Supple; No bruits  CVS: RRR, S1, S2; No M/G/R  LUNGS: CTAB; , no Rhonchi: No rales  ABD: Soft; No TTP; + Normoactive BS vertical abdominal wound and wound VAC in place  EXT: No edema; Pulses 2+ and intact  NEURO: moving right upper extremity spontaneously, minimal spont mvmt of LEs, no movement of left upper extremity       Diagnostics     Labs/Studies Pending at Discharge: No    Last Labs   Recent Labs   Lab 08/08/22  0420 08/06/22  0504 08/04/22  0404   WBC 12.97* 13.91* 11.91*   RBC 3.00* 3.12* 2.93*   Hemoglobin 8.7* 8.9* 8.7*   Hematocrit 27.9* 28.9* 26.7*   MCV 93.0 92.6 91.1   Platelet  Count 237 244 263       Recent Labs   Lab 08/08/22  0420 08/06/22  0504 08/04/22  0404 08/03/22  0637 08/02/22  0527   Sodium 142 141 141 140 139   Potassium  3.8 4.3 4.2 4.9 4.5   Chloride 103 103 103 102 101   CO2 27 27 25 22 23    BUN 23* 27* 25* 45* 34*   Creatinine 3.9* 4.1* 3.8* 5.8* 4.6*   Glucose 115* 96 94 82 99   Calcium 8.8 8.4 8.4 8.8 8.5   Magnesium  --   --  1.8 1.9 1.9       Microbiology Results (last 15 days)       ** No results found for the last 360 hours. **             Patient Instructions   Discharge Diet: renal diet heart healthy   Discharge Activity: as tolerated per PT    Follow Up Appointment:   Follow-up Information       [facility] [place]. Call.    Specialty: Gastroenterology  Why: please call to make f/u in 6 weeks.  Contact information:  [address]  [place] [address]  [phone]  Additional information:  Take [address] to [place], [place] [address] [place].  Turn right on [address] and then right again at [employer].  Destination will be on your right.               Alla German, MD Follow up.    Specialties: Nephrology, Internal Medicine  Contact information:  [address]  [place] [address]  [phone]               [facility] Follow up in 1 month(s).    Specialty: Cardiology  Contact information:  [address]  [place] [address]  [phone]             Marlaine Hind, MD Follow up in 1 week(s).    Specialty: Surgery  Contact information:  [address]  [place] [address]  [phone]               [facility] Follow up in 1 week(s).    Contact information:  [address]  [address]  [place] [address]                            Discharge Medications:     Medication List        START taking these medications      amiodarone 200 MG tablet  Commonly known as: PACERONE  Take 1 tablet (200 mg) by mouth  daily This is a maintenance dose.     apixaban 2.5 MG  Commonly known as: ELIQUIS  Take 1 tablet (2.5 mg) by mouth every 12 (twelve) hours     b complex-vitamin c-folic acid 0.8 MG Tabs  Take 1 tablet by mouth daily     carvedilol 12.5 MG tablet  Commonly known as: COREG  Take 1 tablet (12.5 mg) by mouth every 12 (twelve) hours     oxyCODONE 5 MG immediate release tablet  Commonly known as: ROXICODONE  Take 0.5 tablets (2.5 mg) by mouth every 8 (eight) hours as needed for Pain  Replaces: oxyCODONE 5 MG capsule     pantoprazole 40 mg/20 mL Susp  Commonly known as: PROTONIX  Take 20 mLs (40 mg) by mouth every 12 (twelve) hours  Replaces: pantoprazole 40 MG tablet            CONTINUE taking these medications  Anoro Ellipta 62.5-25 MCG/ACT Aepb  Generic drug: umeclidinium-vilanterol     atorvastatin 40 MG tablet  Commonly known as: LIPITOR     cinacalcet 30 MG tablet  Commonly known as: SENSIPAR     conjugated estrogens vaginal cream  Commonly known as: PREMARIN     montelukast 10 MG tablet  Commonly known as: SINGULAIR     ondansetron 4 MG tablet  Commonly known as: Zofran  Take 1 tablet (4 mg) by mouth every 8 (eight) hours as needed for Nausea     oxybutynin XL 5 MG 24 hr tablet  Commonly known as: DITROPAN-XL     sevelamer carbonate 800 MG tablet  Commonly known as: RENVELA            STOP taking these medications      allopurinol 100 MG tablet  Commonly known as: ZYLOPRIM     AMBIEN PO     b complex vitamins capsule     clopidogrel 75 mg tablet  Commonly known as: PLAVIX     hyoscyamine 0.125 MG tablet  Commonly known as: LEVSIN     insulin aspart 100 UNIT/ML injection  Commonly known as: NovoLOG     metoprolol tartrate 25 MG tablet  Commonly known as: LOPRESSOR     oxyCODONE 5 MG capsule  Commonly known as: OXY-IR  Replaced by: oxyCODONE 5 MG immediate release tablet     pantoprazole 40 MG tablet  Commonly known as: PROTONIX  Replaced by: pantoprazole 40 mg/20 mL Susp     pregabalin 50 MG capsule  Commonly  known as: LYRICA     torsemide 100 MG tablet  Commonly known as: DEMADEX     Tresiba 100 UNIT/ML Soln  Generic drug: Insulin Degludec               Where to Get Your Medications        You can get these medications from any pharmacy    Bring a paper prescription for each of these medications  oxyCODONE 5 MG immediate release tablet       Information about where to get these medications is not yet available    Ask your nurse or doctor about these medications  amiodarone 200 MG tablet  apixaban 2.5 MG  b complex-vitamin c-folic acid 0.8 MG Tabs  carvedilol 12.5 MG tablet  pantoprazole 40 mg/20 mL Susp          Time spent examining patient, discussing with patient/family regarding hospital course, chart review, reconciling medications and discharge planning: 45 minutes.    Signed,  Lennox Pippins, MD    10:45 AM 08/08/2022

## 2022-08-09 MED ORDER — POLYETHYLENE GLYCOL 3350 17 G PO PACK
17.0000 g | PACK | Freq: Every day | ORAL | Status: DC | PRN
Start: 2022-08-09 — End: 2022-08-13

## 2022-08-09 MED ORDER — SENNOSIDES-DOCUSATE SODIUM 8.6-50 MG PO TABS
1.0000 | ORAL_TABLET | Freq: Every evening | ORAL | Status: DC
Start: 2022-08-09 — End: 2022-08-18
  Administered 2022-08-09 – 2022-08-16 (×6): 1 via ORAL
  Filled 2022-08-09 (×8): qty 1

## 2022-08-09 NOTE — Plan of Care (Signed)
NURSING SHIFT NOTE     Patient: Shelby Barron  Day: 01      SHIFT EVENTS     Shift Narrative/Significant Events (PRN med administration, fall, RRT, etc.):     A&Ox1, no distress noted. Patient scored for moderate pain on pain scale. Pt was given PRN dilaudid prior to wound care, pt seemed to be ina significant pain during wound care procedure, patient was given partial dose due to risk, wasted with Charge nurse. Patient was turned Q2 during shift. Will continue to follow plan of care.    Safety and fall precautions remain in place. Purposeful rounding completed.          ASSESSMENT     Changes in assessment from patient's baseline this shift:    Neuro: No  CV: No  Pulm: No  Peripheral Vascular: No  HEENT: No  GI: No  BM during shift: No, Last BM: Last BM Date: 08/06/22  GU: No   Integ: No  MS: No      Mobility: PMP Activity: Step 3 - Bed Mobility of Distance Walked (ft) (Step 6,7): 0 Feet           Lines     Patient Lines/Drains/Airways Status       Active Lines, Drains and Airways       Name Placement date Placement time Site Days    Permacath Catheter - Tunneled 07/14/22 Subclavian vein catheter Right 07/14/22  1404  -- 26    Midline IV 07/01/22 Anterior;Left Upper Arm 07/01/22  1315  Upper Arm  39                         VITAL SIGNS     Vitals:    08/09/22 1936   BP: 145/66   Pulse: 61   Resp: 16   Temp: 97.9 F (36.6 C)   SpO2: 98%       Temp  Min: 97.3 F (36.3 C)  Max: 98.1 F (36.7 C)  Pulse  Min: 61  Max: 76  Resp  Min: 16  Max: 20  BP  Min: 105/61  Max: 145/66  SpO2  Min: 96 %  Max: 100 %      Intake/Output Summary (Last 24 hours) at 08/09/2022 2135  Last data filed at 08/09/2022 1700  Gross per 24 hour   Intake 320 ml   Output 0 ml   Net 320 ml                  CARE PLAN        Problem: Moderate/High Fall Risk Score >5  Goal: Patient will remain free of falls  Outcome: Progressing  Flowsheets (Taken 08/09/2022 0800)  Moderate Risk (6-13):   MOD-Apply bed exit alarm if patient is confused   MOD-Floor mat  at bedside (where available) if appropriate  High (Greater than 13):   HIGH-Visual cue at entrance to patient's room   HIGH-Bed alarm on at all times while patient in bed   HIGH-Consider use of low bed   HIGH-Initiate use of floor mats as appropriate   HIGH-Apply yellow "Fall Risk" arm band     Problem: Compromised Sensory Perception  Goal: Sensory Perception Interventions  Outcome: Progressing  Flowsheets (Taken 08/09/2022 0800)  Sensory Perception Interventions: Offload heels, Pad bony prominences, Reposition q 2hrs/turn Clock, Q2 hour skin assessment under devices if present     Problem: Compromised Moisture  Goal: Moisture level Interventions  Outcome: Progressing  Flowsheets (Taken 08/09/2022 0800)  Moisture level Interventions: Moisture wicking products, Moisture barrier cream     Problem: Compromised Activity/Mobility  Goal: Activity/Mobility Interventions  Outcome: Progressing  Flowsheets (Taken 08/09/2022 0800)  Activity/Mobility Interventions: Pad bony prominences, TAP Seated positioning system when OOB, Promote PMP, Reposition q 2 hrs / turn clock, Offload heels     Problem: Compromised Nutrition  Goal: Nutrition Interventions  Outcome: Progressing  Flowsheets (Taken 08/09/2022 0800)  Nutrition Interventions: Discuss nutrition at Rounds, I&Os, Document % meal eaten, Daily weights     Problem: Compromised Friction/Shear  Goal: Friction and Shear Interventions  Outcome: Progressing  Flowsheets (Taken 08/09/2022 0800)  Friction and Shear Interventions: Pad bony prominences, Off load heels, HOB 30 degrees or less unless contraindicated, Consider: TAP seated positioning, Heel foams     Problem: Inadequate Airway Clearance  Goal: Patent Airway maintained  Outcome: Progressing  Flowsheets (Taken 08/05/2022 1255)  Patent airway maintained:   Position patient for maximum ventilatory efficiency   Provide adequate fluid intake to liquefy secretions   Suction secretions as needed   Reinforce use of ordered respiratory  interventions (i.e. CPAP, BiPAP, Incentive Spirometer, Acapella, etc.)   Reposition patient every 2 hours and as needed unless able to self-reposition  Goal: Normal respiratory rate/effort achieved/maintained  Outcome: Progressing  Flowsheets (Taken 08/05/2022 1255)  Normal respiratory rate/effort achieved/maintained: Plan activities to conserve energy: plan rest periods     Problem: Infection Prevention  Goal: Free from infection  Outcome: Progressing  Flowsheets (Taken 08/05/2022 1255)  Free from infection:   Monitor/assess vital signs   Encourage/assist patient to turn, cough and perform deep breathing every 2 hours   Assess incision for evidence of healing   Monitor/assess output from surgical drain if present   Assess for signs and symptoms of infection   Monitor/assess lab values and report abnormal values     Problem: Safety  Goal: Patient will be free from injury during hospitalization  Outcome: Progressing  Flowsheets (Taken 08/05/2022 1255)  Patient will be free from injury during hospitalization:   Assess patient's risk for falls and implement fall prevention plan of care per policy   Use appropriate transfer methods   Provide and maintain safe environment   Include patient/ family/ care giver in decisions related to safety   Ensure appropriate safety devices are available at the bedside   Hourly rounding   Assess for patients risk for elopement and implement Elopement Risk Plan per policy   Provide alternative method of communication if needed (communication boards, writing)  Goal: Patient will be free from infection during hospitalization  Outcome: Progressing  Flowsheets (Taken 08/05/2022 1255)  Free from Infection during hospitalization:   Monitor lab/diagnostic results   Monitor all insertion sites (i.e. indwelling lines, tubes, urinary catheters, and drains)   Assess and monitor for signs and symptoms of infection   Encourage patient and family to use good hand hygiene technique     Problem: Pain  Goal:  Pain at adequate level as identified by patient  Outcome: Progressing  Flowsheets (Taken 08/08/2022 1803)  Pain at adequate level as identified by patient:   Identify patient comfort function goal   Assess for risk of opioid induced respiratory depression, including snoring/sleep apnea. Alert healthcare team of risk factors identified.   Assess pain on admission, during daily assessment and/or before any "as needed" intervention(s)   Reassess pain within 30-60 minutes of any procedure/intervention, per Pain Assessment, Intervention, Reassessment (AIR) Cycle   Evaluate if patient  comfort function goal is met   Evaluate patient's satisfaction with pain management progress   Offer non-pharmacological pain management interventions   Consult/collaborate with Pain Service   Consult/collaborate with Physical Therapy, Occupational Therapy, and/or Speech Therapy     Problem: Side Effects from Pain Analgesia  Goal: Patient will experience minimal side effects of analgesic therapy  Outcome: Progressing  Flowsheets (Taken 08/08/2022 1803)  Patient will experience minimal side effects of analgesic therapy:   Monitor/assess patient's respiratory status (RR depth, effort, breath sounds)   Prevent/manage side effects per LIP orders (i.e. nausea, vomiting, pruritus, constipation, urinary retention, etc.)   Assess for changes in cognitive function   Evaluate for opioid-induced sedation with appropriate assessment tool (i.e. POSS)     Problem: Hemodynamic Status: Cardiac  Goal: Stable vital signs and fluid balance  Outcome: Progressing  Flowsheets (Taken 08/08/2022 1803)  Stable vital signs and fluid balance:   Assess signs and symptoms associated with cardiac rhythm changes   Monitor lab values     Problem: Renal Instability  Goal: Fluid and electrolyte balance are achieved/maintained  Outcome: Progressing  Flowsheets (Taken 08/08/2022 1803)  Fluid and electrolyte balance are achieved/maintained:   Monitor/assess lab values and report  abnormal values   Assess and reassess fluid and electrolyte status   Monitor for muscle weakness   Follow fluid restrictions/IV/PO parameters     Problem: Patient Receiving Advanced Renal Therapies  Goal: Therapy access site remains intact  Outcome: Progressing  Flowsheets (Taken 08/08/2022 1803)  Therapy access site remains intact:   Assess therapy access site   Change therapy access site dressing as needed     Problem: Fluid and Electrolyte Imbalance/ Endocrine  Goal: Adequate hydration  Outcome: Progressing  Flowsheets (Taken 08/05/2022 1255)  Adequate hydration:   Assess mucus membranes, skin color, turgor, perfusion and presence of edema   Assess for peripheral, sacral, periorbital and abdominal edema   Monitor and assess vital signs and perfusion     Problem: Diabetes: Glucose Imbalance  Goal: Blood glucose stable at established goal  Outcome: Progressing  Flowsheets (Taken 08/08/2022 1803)  Blood glucose stable at established goal:   Monitor lab values   Monitor intake and output.  Notify LIP if urine output is < 30 mL/hour.   Follow fluid restrictions/IV/PO parameters   Include patient/family in decisions related to nutrition/dietary selections   Assess for hypoglycemia /hyperglycemia   Coordinate medication administration with meals, as indicated   Monitor/assess vital signs   Ensure appropriate diet and assess tolerance   Ensure adequate hydration   Ensure appropriate consults are obtained (Nutrition, Diabetes Education, and Case Management)   Ensure patient/family has adequate teaching materials     Problem: Inadequate Gas Exchange  Goal: Patent Airway maintained  Outcome: Progressing  Flowsheets (Taken 08/05/2022 1255)  Patent airway maintained:   Position patient for maximum ventilatory efficiency   Provide adequate fluid intake to liquefy secretions   Suction secretions as needed   Reinforce use of ordered respiratory interventions (i.e. CPAP, BiPAP, Incentive Spirometer, Acapella, etc.)   Reposition  patient every 2 hours and as needed unless able to self-reposition  Goal: Adequate oxygenation and improved ventilation  Outcome: Progressing  Flowsheets (Taken 08/09/2022 2130)  Adequate oxygenation and improved ventilation:   Assess lung sounds   Monitor SpO2 and treat as needed   Monitor and treat ETCO2   Provide mechanical and oxygen support to facilitate gas exchange   Position for maximum ventilatory efficiency   Teach/reinforce use of incentive spirometer  10 times per hour while awake, cough and deep breath as needed   Increase activity as tolerated/progressive mobility   Plan activities to conserve energy: plan rest periods     Problem: Potential for Aspiration  Goal: Risk of aspiration will be minimized  Outcome: Progressing  Flowsheets (Taken 08/09/2022 2130)  Risk of aspiration will be minimized:   Place swallow precaution signage above bed and supervise patient during oral intake   Assess/monitor ability to swallow using dysphagia screen: Keep patient NPO if patient fails screening   Monitor/assess for signs of aspiration (tachypnea, cough, wheezing, clearing throat, hoarseness after eating, decrease in SaO2)   Consult/collaborate/follow recommended modified texture diet/thicken liquids as indicated by Speech Pathologist   Place patient up in chair to eat, if possible/head of bed up 90 degrees to eat if unable to be out of bed   Instruct patient to take small bites, small single sips of liquid, and do not use a straw     Problem: Every Day - Stroke  Goal: Patient will maintain adequate oxygenation  Outcome: Progressing  Flowsheets (Taken 08/09/2022 2130)  Patient will maintain adequate oxygenation:   Suction secretions as needed   Maintain SpO2 of greater than 92%  Goal: Patient's risk of aspiration will be minimized  Outcome: Progressing  Flowsheets (Taken 08/09/2022 2130)  Patient's risk of aspiration will be minimized:   Complete new dysphagia screen for any change in status: Keep patient NPO if patient  fails   Place swallow precaution signage above bed   Monitor/assess for signs of aspiration (tachypnea, cough, wheezing, clearing throat, hoarseness after eating, decrease in SaO2   Order modified texture diet as recommend by Speech Pathologist   Thicken liquids as recommended by Speech Pathologist   Assess and monitor ability to swallow   Keep head of bed up a minimum of 30 degrees when hemodynamically stable   Place patient up in chair to eat, if possible   HOB up 90 degrees to eat if unable to be OOB   Instruct patient to take small bites   Instruct patient to take small single sips of liquid   Consult/Collaborate with Speech Pathologist for dysphagia   Supervise patient during oral intake  Goal: Skin integrity is maintained or improved  Outcome: Progressing  Flowsheets (Taken 08/09/2022 2130)  Skin integrity is maintained or improved:   Assess Braden Scale every shift   Increase activity as tolerated/progressive mobility   Turn or reposition patient every 2 hours or as needed unless able to reposition self   Relieve pressure to bony prominences   Avoid shearing   Keep skin clean and dry   Encourage use of lotion/moisturizer on skin   Monitor patient's hygiene practices   Collaborate with Wound, Ostomy, and Continence Nurse   Keep head of bed 30 degrees or less (unless contraindicated)  Goal: Neurovascular status is stable or improving  Outcome: Progressing  Flowsheets (Taken 08/09/2022 2130)  Neurovascular status is stable or improving:   Monitor/assess neurovascular status (pulses, capillary refill, pain, paresthesia, presence of edema)   Monitor/assess for signs of Venous Thrombus Emboli (edema of calf/thigh redness, pain)   Monitor/assess site of invasive procedure for signs of bleeding  Goal: Effective coping demonstrated  Outcome: Progressing  Flowsheets (Taken 08/09/2022 2130)  Effective coping demonstrated:   Assess/report to LIP uncontrolled anxiety, depression, or ineffective coping   Offer reassurance to  decrease anxiety  Goal: Will be able to express needs and understand communication  Outcome: Progressing  Flowsheets (Taken  08/09/2022 2130)  Able to express needs and understand communication:   Provide alternative method of communication if needed   Consult/collaborate with Speech Language Pathology (SLP)   Consult/collaborate with Case Management/Social Work   Include patient care companion in decisions related to communication   Patient/patient care companion demonstrates understanding on disease process, treatment plan, medications and discharge plan     Problem: Day of Discharge - Stroke  Goal: Able to express understanding of discharge instructions and stroke education  Outcome: Progressing  Flowsheets (Taken 08/08/2022 1803)  Able to express understanding of discharge instructions and stroke education:   Provide alternative method of communication if needed   Consult/collaborate with Case Management/Social Work   Include patient care companion in decisions related to communication   Patient/patient care companion demonstrates understanding on disease process, treatment plan, medications and discharge plan   Ensure "Discharge instructions: Stroke" appears on discharge paperwork  (After Visit Summary-AVS)     Problem: Pain interferes with ability to perform ADL  Goal: Pain at adequate level as identified by patient  Outcome: Progressing  Flowsheets (Taken 08/08/2022 1803)  Pain at adequate level as identified by patient:   Identify patient comfort function goal   Assess for risk of opioid induced respiratory depression, including snoring/sleep apnea. Alert healthcare team of risk factors identified.   Assess pain on admission, during daily assessment and/or before any "as needed" intervention(s)   Reassess pain within 30-60 minutes of any procedure/intervention, per Pain Assessment, Intervention, Reassessment (AIR) Cycle   Evaluate if patient comfort function goal is met   Evaluate patient's satisfaction with  pain management progress   Offer non-pharmacological pain management interventions   Consult/collaborate with Pain Service   Consult/collaborate with Physical Therapy, Occupational Therapy, and/or Speech Therapy     Problem: Everyday - Heart Failure  Goal: Mobility/Activity is Maintained at Optimal Level for Patient  Outcome: Progressing  Flowsheets (Taken 08/08/2022 1803)  Mobility/Activity is Maintained at Optimal Level for Patient:   Increase mobility as tolerated/progressive mobility protocol   Maintain SCD's as Ordered   Perform active/passive ROM   Reposition patient every 2 hours and as needed unless able to reposition self   Assess for changes in respiratory status, level of consciousness and/or development of fatigue

## 2022-08-09 NOTE — Discharge Summary (Signed)
[facility]      Patient: Shelby Barron  Admission Date: 06/28/2022   DOB: [birth year]  Discharge Date: 08/09/2022    MRN: [id number]  Discharge Attending:Audrina Marten Laurine Blazer, MD     Referring Physician: Patsy Lager, MD  PCP: Patsy Lager, MD       DISCHARGE SUMMARY     Discharge Information   Admission Diagnosis:   Bladder cancer    Discharge Diagnosis:   Active Hospital Problems    Diagnosis    Anemia    COPD (chronic obstructive pulmonary disease)    Bladder cancer    ESRD (end stage renal disease)        Malnutrition Documentation    Moderate Malnutrition related to inadequate nutritional intake in the setting of acute illness as evidenced by <50% EER > 5 days, mild muscle losses (temporalis, deltoid) - new                Discharge Condition: stable   Consultants: surgery, ID, cardiology, nephro, palliative, GI and neurology   Discharged to: SNF peer to peer denied, [facility] pending dispo planning per CM likely home with [facility] Course   Presentation History   Per admission H&P: Shelby Barron is a 82 y.o. female with history of COPD, [name], type 2 diabetes, renal cell carcinoma status post partial bilateral nephrectomy, [name], new diagnosis of urothelial cancer.       She was recently admitted in May 2024 at which time she was transitioned from PD to HD.  At that time was diagnosed with urothelial cancer.  She presented today for scheduled cystectomy and completion nephrectomy with urology.  Her last Plavix dose was 6/3, her last dialysis was 6/8.  She had preop clearance with cardiology which included an echo.     In the OR course was complicated by  Small bowel enterotomy -General surgery called in enterotomy repaired with initial plan to put patient back into continuity     Later in the case significant bleeding during nephrectomy/cystectomy.  Approximately 1 L EBL.  Given significant bleeding decision made to leave patient in discontinuity and abdomen open with plans to return to the OR  tomorrow.    Hospital Course (42 Days)     Acute toxic metabolic Encephalopathy/delirium stable-improved  Possible underlying cognitive impairment  - On and off confusion alert oriented x 2, now relatively stable the last several days  - Patient was upgraded to ICU on 6/30 for lethargy and decreased responsiveness. CT head was with no acute finding.   - EEG consistent with encephalopathy no seizure, CVA management as below  - SLP advanced diet. NG tube removed 7/9.  - Palliative consulted:  Family was clear they would like no limits to care and patient remained full code.     Anemia of CKD and acute blood loss anemia  - TSAT 18%  - given Aranesp this admission per nephrology  - Hgb now stable. No signs of active bleeding.     S/p open cystectomy, bilateral nephrectomies, and resection of small bowel after intra-op enterotomy    Mixed shock - hemorrhagic vs septic: Resolved  Necrotic wound tissue  - 6/10 small bowel enteretomy with 1L EBL. Went to the OR on June 12 for small bowel anastomosis, closure of mesenteric defect, closure of abdominal wall with wound VAC application. Wound VAC in place. Will need outpatient wound care follow up for ongoing management.  - Surgery consulted,  CT abdomen with and without contrast did not show any anastomotic leak or abscess.   - Status post wound debridement by general surgery at bedside on 7/15 for necrotic wound tissue  - ID consulted, completed meropenem and Mycamine for 2 weeks on 7/3. No further abx recommended after bedside debridement on 7/15     Acute CVA   Petechial hemorrhage vs cortical laminar necrosis  - Stopped ASA and started Eliquis per neurology on 7/5 after >10 days after CVA. Continue statin therapy.  - Carotid Doppler showed plaque with no critical stenosis  - Head CT on 7/7 w/ a few areas of cortical hyperdensity - petechial hemorrhage vs cortical laminar necrosis. Discussed with neuro OK to continue eliquis for now. Repeat head CT on 7/8 stable.  - EEG  encephalopathic pattern no seizure, repeat MRI no new infarction  - PT OT recommend SNF on discharge     Acute hypoxemic respiratory failure resolved  COPD  - Extubated on 6/13, currently on room air  - X-ray repeated on 6/29 showed resolution of interstitial edema     ESRD  S/p CRRT this admission, now on HD TTS  Continue dialysis on routine basis as outpatient  IR removed the nonfunctional permacath and right Quinton IJ cath and replaced with a new permacath on 6/26.     New onset Afib with RVR, improved  - CHA2DS2-VASc score is 7.   - Status post amiodarone drip. Now stable on oral amiodarone and coreg per cardiology recommendations  - GI and general surgery cleared for anticoagulation. Started on Eliquis since 07/23/22 with neurology clearance after CVA     Renal cell carcinoma outpatient follow-up hematology oncology  Urothelial carcinoma             Progress Note/Physical Exam at Discharge     Subjective:Patient is stable for discharge.    Vitals:    08/09/22 0431 08/09/22 0546 08/09/22 0729 08/09/22 0838   BP: 133/64  124/60    Pulse: 76  65    Resp:   20    Temp:   97.3 F (36.3 C)    TempSrc:   Oral    SpO2:   96% 96%   Weight:  84.6 kg (186 lb 8.2 oz)     Height:           GEN APPEARANCE: Alert, awake and oriented x 2 (oriented to self and place)  HEENT: PERLA; EOMI; Conjunctiva Clear  NECK: Supple; No bruits  CVS: RRR, S1, S2; No M/G/R  LUNGS: CTAB; , no Rhonchi: No rales  ABD: Soft; No TTP; + Normoactive BS vertical abdominal wound and wound VAC in place  EXT: No edema; Pulses 2+ and intact  NEURO: moving right upper extremity spontaneously, minimal spont mvmt of LEs, no movement of left upper extremity       Diagnostics     Labs/Studies Pending at Discharge: No    Last Labs   Recent Labs   Lab 08/08/22  0420 08/06/22  0504 08/04/22  0404   WBC 12.97* 13.91* 11.91*   RBC 3.00* 3.12* 2.93*   Hemoglobin 8.7* 8.9* 8.7*   Hematocrit 27.9* 28.9* 26.7*   MCV 93.0 92.6 91.1   Platelet Count 237 244 263        Recent Labs   Lab 08/08/22  0420 08/06/22  0504 08/04/22  0404 08/03/22  0637   Sodium 142 141 141 140   Potassium 3.8 4.3 4.2 4.9   Chloride 103 103  103 102   CO2 27 27 25 22    BUN 23* 27* 25* 45*   Creatinine 3.9* 4.1* 3.8* 5.8*   Glucose 115* 96 94 82   Calcium 8.8 8.4 8.4 8.8   Magnesium  --   --  1.8 1.9       Microbiology Results (last 15 days)       ** No results found for the last 360 hours. **             Patient Instructions   Discharge Diet: renal diet heart healthy   Discharge Activity: as tolerated per PT    Follow Up Appointment:   Follow-up Information       [facility] [place]. Call.    Specialty: Gastroenterology  Why: please call to make f/u in 6 weeks.  Contact information:  [address]  [place] [address]  [phone]  Additional information:  Take [address] to [place], [place] [address] [place].  Turn right on [address] and then right again at [employer].  Destination will be on your right.               Alla German, MD Follow up.    Specialties: Nephrology, Internal Medicine  Contact information:  [address]  [place] [address]  [phone]               [facility] Follow up in 1 month(s).    Specialty: Cardiology  Contact information:  [address]  [place] [address]  [phone]             Marlaine Hind, MD Follow up in 1 week(s).    Specialty: Surgery  Contact information:  [address]  [place] [address]  [phone]               [facility] Follow up in 1 week(s).    Contact information:  [address]  [address]  [place] [address]                            Discharge Medications:     Medication List        START taking these medications      amiodarone 200 MG tablet  Commonly known as: PACERONE  Take 1 tablet (200 mg) by mouth daily This is a maintenance dose.     apixaban 2.5 MG  Commonly known  as: ELIQUIS  Take 1 tablet (2.5 mg) by mouth every 12 (twelve) hours     b complex-vitamin c-folic acid 0.8 MG Tabs  Take 1 tablet by mouth daily     carvedilol 12.5 MG tablet  Commonly known as: COREG  Take 1 tablet (12.5 mg) by mouth every 12 (twelve) hours     oxyCODONE 5 MG immediate release tablet  Commonly known as: ROXICODONE  Take 0.5 tablets (2.5 mg) by mouth every 8 (eight) hours as needed for Pain  Replaces: oxyCODONE 5 MG capsule     pantoprazole 40 mg/20 mL Susp  Commonly known as: PROTONIX  Take 20 mLs (40 mg) by mouth every 12 (twelve) hours  Replaces: pantoprazole 40 MG tablet            CONTINUE taking these medications      Anoro Ellipta 62.5-25 MCG/ACT Aepb  Generic drug: umeclidinium-vilanterol  atorvastatin 40 MG tablet  Commonly known as: LIPITOR     cinacalcet 30 MG tablet  Commonly known as: SENSIPAR     conjugated estrogens vaginal cream  Commonly known as: PREMARIN     montelukast 10 MG tablet  Commonly known as: SINGULAIR     ondansetron 4 MG tablet  Commonly known as: Zofran  Take 1 tablet (4 mg) by mouth every 8 (eight) hours as needed for Nausea     oxybutynin XL 5 MG 24 hr tablet  Commonly known as: DITROPAN-XL     sevelamer carbonate 800 MG tablet  Commonly known as: RENVELA            STOP taking these medications      allopurinol 100 MG tablet  Commonly known as: ZYLOPRIM     AMBIEN PO     b complex vitamins capsule     clopidogrel 75 mg tablet  Commonly known as: PLAVIX     hyoscyamine 0.125 MG tablet  Commonly known as: LEVSIN     insulin aspart 100 UNIT/ML injection  Commonly known as: NovoLOG     metoprolol tartrate 25 MG tablet  Commonly known as: LOPRESSOR     oxyCODONE 5 MG capsule  Commonly known as: OXY-IR  Replaced by: oxyCODONE 5 MG immediate release tablet     pantoprazole 40 MG tablet  Commonly known as: PROTONIX  Replaced by: pantoprazole 40 mg/20 mL Susp     pregabalin 50 MG capsule  Commonly known as: LYRICA     torsemide 100 MG tablet  Commonly known as:  DEMADEX     Tresiba 100 UNIT/ML Soln  Generic drug: Insulin Degludec               Where to Get Your Medications        You can get these medications from any pharmacy    Bring a paper prescription for each of these medications  oxyCODONE 5 MG immediate release tablet       Information about where to get these medications is not yet available    Ask your nurse or doctor about these medications  amiodarone 200 MG tablet  apixaban 2.5 MG  b complex-vitamin c-folic acid 0.8 MG Tabs  carvedilol 12.5 MG tablet  pantoprazole 40 mg/20 mL Susp          Time spent examining patient, discussing with patient/family regarding hospital course, chart review, reconciling medications and discharge planning: 45 minutes.    SignedLennox Pippins, MD    11:03 AM 08/09/2022

## 2022-08-09 NOTE — Progress Notes (Signed)
D/C Delay pending Private Pay at [facility].    CM met with pt's daughter [name] to follow up on her d/c plan and decision.  CM further discussed and educated daughter on [place] and daughter received copy along with copy in Chart.    Daughter is in agreement to Private Pay at [facility] effective 08/10/22.      CM spoke with Cassi of [name] [phone] who is working with pt and Admissions for payment and SOC with HD.    CM also notified Charge RN to ensure pt is on an Early Morning Schedule for HD and is in the process of coordinating Transport for pt.    Real Cons Shiven Junious MSW, [facility]  Care Manager   [facility]  [phone]

## 2022-08-09 NOTE — Progress Notes (Signed)
Nutritional Support Services  Nutrition Follow-up    Shelby Barron 82 y.o. female   MRN: [id number]    Summary of Nutrition Recommendations:  Follow SLP recommendations for diet consistency; encourage PO  Continue Nepro BID to help pt meet estimated needs. Each Nepro shake provides 425 kcal, 19 gm protein, 250 mg potassium and 170 mg phosphorus.  Monitor wt trends  Daily Nephrovite    -----------------------------------------------------------------------------------------------------------------                                                       ASSESSMENT DATA     Subjective Nutrition:   Pt seen at bedside. Dislikes ONS but family encouraging intake- typically drinking at least 1 Nepro per day. Discussed importance of nutrition and meeting needs as pt with increased needs from HD and wound healing. Family attempting to have pt consume >75% of trays but pt dislikes food and consistency being sent. SLP following- 7/17 recommend puree, mild thick. Discussed pt with SLP.     Learning Needs: Encouraged intake    Events of Current Admission:  82 yo female with PHMx COPD, CVA, DM, renal cell carcinoma s/p partial b/l nephrectomy, [name] (05/2022), new diagnosis of urothelial CA, presenting for scheduled cystectomy and complete nephrectomy. OR course c/b small bowel enterotomy and significant bleeding.   6/11: CRRT initiated  6/12: OR for small bowel anastomosis, closure of mesenteric defect, closure of abdominal wall with wound VAC application  6/13: Concern for prolonged NPO status, TPN initiated, Extubation  6/15: ROBF, TPN completed with plan for transition to PO/EN pending swallow. Transition from CRRT to HD   6/16: SLP eval, rec NPO.  6/17: Feeding tube placed and tube feeding initiated  7/2: CLD initiated   7/8: Diet advanced to Pureed   7/9: NGT removed, TF d/c  7/15: Plan for wound debridement with short course Abx    Medical Hx:  has a past medical history of Acid reflux, Asthma, well controlled, Cancer of  kidney, Chronic lower back pain, Congestive heart disease, COPD (chronic obstructive pulmonary disease), CVA (cerebral infarction) (01/12/2003), Diabetes, Diabetic nephropathy, Fibroids, Gout, H/O: gout, Hyperlipidemia, Hypertension, Neuropathy of hand, and SOB (shortness of breath).     Orders Placed This Encounter   Procedures    Adult diet Therapeutic/ Modified; Solid; Pureed (IDDSI level 4); Mildly thick (IDDSI level 2); Room service: No    Magic Cup Quantity: A. One; Frequency: BID (2 times a day) - with Lunch and Dinner    Nepro supplement Quantity: A. One; Flavor: Wild berry; Frequency: Daily with dinner Preference for Allyson Sabal if possible     ANTHROPOMETRIC  Height: 157.5 cm (5\' 2" )  Weight: 84.6 kg (186 lb 8.2 oz)  Body mass index is 34.11 kg/m.     Weight History Summary   Weight Monitoring     Weight Weight Method   07/06/2022 93.6 kg  Bed Scale    07/07/2022 90.4 kg  Bed Scale     90.4 kg     07/08/2022 93.2 kg  Bed Scale    07/09/2022 93.1 kg  Bed Scale    07/10/2022 93.2 kg  Bed Scale     88.3 kg  Bed Scale    07/11/2022 88.5 kg  Bed Scale    07/12/2022 87.8 kg  Bed Scale  07/13/2022 87.5 kg  Bed Scale    07/14/2022 84 kg     07/15/2022 88.3 kg  Bed Scale    07/16/2022 86.5 kg  Bed Scale    07/17/2022 96.9 kg  Bed Scale    07/18/2022 101 kg  Bed Scale     87.5 kg     07/19/2022 86.7 kg  Bed Scale    07/20/2022 90.4 kg  Bed Scale    07/21/2022 90.4 kg  Bed Scale    07/22/2022 90.4 kg  Bed Scale    07/23/2022 90.4 kg  Bed Scale    07/24/2022 87.5 kg  Bed Scale    07/25/2022 86.9 kg  Bed Scale    07/26/2022 86.9 kg  Bed Scale    07/27/2022 95.2 kg  Bed Scale    07/28/2022 90.5 kg  Bed Scale    07/29/2022 97.387 kg  Bed Scale    07/30/2022 89.6 kg  Bed Scale    07/31/2022 87.317 kg     08/01/2022 85.6 kg     08/02/2022 89.3 kg     08/03/2022 90.1 kg  Bed Scale     90.102 kg     08/04/2022 89.4 kg  Bed Scale    08/05/2022 82.5 kg  Bed Scale    08/06/2022 83.099 kg  Bed Scale    08/07/2022 85.7 kg  Bed Scale    08/08/2022 86 kg  Bed Scale     08/09/2022 84.6 kg       ESTIMATED NEEDS  Total Daily Energy Needs: 2125 to 2550 kcal  Method for Calculating Energy Needs: 25 kcal - 30 kcal per kg  at 85 kg (Actual body weight)  Rationale: non icu, HD, wound     Total Daily Protein Needs: 99.792 to 124.74 g  Method for Calculating Protein Needs: 2 g - 2.5 g per kg at 49.896 kg (Ideal body weight)  Rationale: non icu, HD, wound    Total Daily Fluid Needs: 1247.4 to 1496.88 ml  Method for Calculating Fluid Needs: 25 ml - 30 ml  per kg at 49.896 kg (Ideal body weight)  Rationale: BMI, wound; or per team    Pertinent Medications:   Current Facility-Administered Medications   Medication Dose Route Frequency    acetaminophen  650 mg Oral 4 times per day    Or    acetaminophen  650 mg Oral 4 times per day    amiodarone  200 mg per NG tube Daily    apixaban  2.5 mg Oral Q12H SCH    atorvastatin  40 mg Oral QHS    b complex-vitamin c-folic acid  1 tablet Oral Daily    carvedilol  12.5 mg Oral Q12H SCH    darbepoetin alfa  0.45 mcg/kg Subcutaneous Weekly    glycopyrrolate  0.2 mg Intravenous Q8H    lidocaine  20 mL Intradermal Once    pantoprazole  40 mg per NG tube Q12H [facility]    thiamine  100 mg per NG tube Daily    umeclidinium-vilanterol  1 puff Inhalation QAM     PRN Meds given in the past 48 hours: tums    Pertinent labs:  Recent Labs   Lab 08/08/22  0420 08/06/22  0504 08/04/22  0404 08/03/22  0637   Sodium 142 141 141 140   Potassium 3.8 4.3 4.2 4.9   Chloride 103 103 103 102   CO2 27 27 25 22    BUN 23* 27* 25* 45*  Creatinine 3.9* 4.1* 3.8* 5.8*   Glucose 115* 96 94 82   Calcium 8.8 8.4 8.4 8.8   Magnesium  --   --  1.8 1.9   Phosphorus  --   --  3.6 4.7   GFR 10.8* 10.2* 11.1* 6.7*     Recent Labs   Lab 08/08/22  2139 08/07/22  0728 08/06/22  2123 08/06/22  1722 08/06/22  1150   Whole Blood Glucose POCT 154* 101* 141* 98 162*     Physical Assessment: Date 7/22  Head: temple region: slight depression with decrease in muscle tone/resistance (mild muscle loss -  temporalis)  Upper Body: clavicle bone region: some protrusion of the clavicle with decrease in muscle tone/resistance (mild muscle loss - pectoralis major) and shoulder and acromion bone region: slight protrusion of acromion process, decrease in muscle tone/resistance (mild muscle loss - deltoid)  Lower Body: posterior calf region: some shape to the bulb, but not well-developed, decrease in muscle tone/resistance (mild muscle loss - gastrocnemius)  Edema: edema: moderate pitting (2+), indentation lasts 15 to 30 seconds (moderate edema - BLE)  Skin: large abd woud, WV in place  GI function: soft; LBM 7/19                                                    NUTRITION DIAGNOSIS     Moderate Malnutrition related to inadequate nutritional intake in the setting of acute illness as evidenced by </=50% EER > 5 days, mild-moderate muscle losses (temporalis, prectoralis, deltoid, gastrocnemius), mild fat loss (buccal, triceps) . - active                                                           INTERVENTION     Nutrition recommendation - Please refer to top of note                                                     MONITORING/EVALUATION     Goal:  Pt will consume >75% of meals and ONS by follow up - active     Nutrition Risk Level: High (will follow up at least 2 times per week and PRN)      Urvi Imes S. [name] MS, RD, CNSC  Clinical Dietitian

## 2022-08-09 NOTE — Progress Notes (Signed)
Covering CM may follow up with Provider     [facility]. Contact person Marya Landry at [phone].  Company is aware and awaiting call for pt's pick up.     Per [facility], Room number is 310 and Report [phone]    Real Cons. Latreshia Beauchaine MSW, [facility]  Care Manager   [facility]  [phone]

## 2022-08-09 NOTE — Progress Notes (Signed)
Infectious Diseases & Tropical Medicine  Progress Note    08/09/2022   Yasha Tibbett WFU:[id number] is a 82 y.o. female,       Assessment:     Encephalopathy  Acute CVA  History of bladder cancer and renal failure  Status post radical cystectomy/bilateral nephrectomy, enterectomy and lysis of adhesion (06/28/2022)  S/p small bowel resection  Abdominal wall wound s/p bedside debridement/wound VAC placement (08/02/2022)  End-stage renal disease on hemodialysis  Atrial fibrillation  Risk of aspiration    Plan:     Continue monitoring without antibiotics  Wound care nurse following  Continue probiotics  Monitor electrolytes and renal functions closely  Monitor clinically    ROS:     General: No fever, sleepy  Respiratory: No cough or shortness of breath  Gastrointestinal: No abdominal pain  Genito-Urinary: no hematuria  Musculoskeletal: no edema  Neurological: Sleepy  Dermatological: No rash    Physical Examination:     Blood pressure 124/60, pulse 65, temperature 97.3 F (36.3 C), temperature source Oral, resp. rate 20, height 1.575 m (5\' 2" ), weight 84.6 kg (186 lb 8.2 oz), SpO2 96%.    General Appearance: Appears comfortable  HEENT: Pupils are equal, round, and reactive to light.   Lungs: Clear to auscultation  Heart: Regular rate and rhythm  Chest: Symmetric chest wall expansion.   Abdomen: soft, no hepatosplenomegaly, no bowel sounds, abdominal wall wound VAC in place  Neurological: Sleepy  Extremities: No edema    Laboratory And Diagnostic Studies:     Recent Labs     08/08/22  0420   WBC 12.97*   Hemoglobin 8.7*   Hematocrit 27.9*   Platelet Count 237     Recent Labs     08/08/22  0420   Sodium 142   Potassium 3.8   Chloride 103   CO2 27   BUN 23*   Creatinine 3.9*   Glucose 115*   Calcium 8.8     Recent Labs     08/08/22  0420   AST (SGOT) 10   ALT 8   Alkaline Phosphatase 95   Protein, Total 5.0*   Albumin 1.7*         Current Meds:      Scheduled Meds: PRN Meds:    acetaminophen, 650 mg, Oral, 4  times per day   Or  acetaminophen, 650 mg, Oral, 4 times per day  amiodarone, 200 mg, per NG tube, Daily  apixaban, 2.5 mg, Oral, Q12H SCH  atorvastatin, 40 mg, Oral, QHS  b complex-vitamin c-folic acid, 1 tablet, Oral, Daily  carvedilol, 12.5 mg, Oral, Q12H SCH  darbepoetin alfa, 0.45 mcg/kg, Subcutaneous, Weekly  glycopyrrolate, 0.2 mg, Intravenous, Q8H  lidocaine, 20 mL, Intradermal, Once  pantoprazole, 40 mg, per NG tube, Q12H SCH  thiamine, 100 mg, per NG tube, Daily  umeclidinium-vilanterol, 1 puff, Inhalation, QAM        Continuous Infusions:       sodium chloride, , PRN  acetaminophen, 650 mg, Q6H PRN  albuterol-ipratropium, 3 mL, Q6H PRN  benzocaine, 1 spray, TID PRN  calcium carbonate, 500 mg, Q4H PRN  dextrose, 15 g of glucose, PRN   Or  dextrose, 12.5 g, PRN   Or  dextrose, 12.5 g, PRN   Or  glucagon (rDNA), 1 mg, PRN  diphenhydrAMINE, 25 mg, Q6H PRN  hydrALAZINE, 10 mg, Q3H PRN  HYDROmorphone, 1 mg, Q4H PRN  labetalol, 10 mg, Q15 Min PRN  lidocaine 2% jelly, , Once PRN  naloxone, 0.2 mg, PRN  ondansetron, 4 mg, Q6H PRN  oxyCODONE, 2.5 mg, Q4H PRN  phenol, 1 spray, Q2H PRN          Feras Gardella A. Janalyn Rouse, M.D.  08/09/2022  9:28 AM

## 2022-08-09 NOTE — Nursing Progress Note (Signed)
4 eyes in 4 hours pressure injury assessment note:      Completed with: Layla, PCT  Unit & Time admitted: Unit 23             Bony Prominences: Check appropriate box; if wound is present enter wound assessment in LDA     Occiput:                 [x] WNL  []  Wound present  Face:                     [x] WNL  []  Wound present  Ears:                      [x] WNL  []  Wound present  Spine:                    [x] WNL  []  Wound present  Shoulders:             [x] WNL  []  Wound present  Elbows:                  [x] WNL  []  Wound present  Sacrum/coccyx:     [] WNL  [x]  Wound present  Ischial Tuberosity:  [] WNL  [x]  Wound present  Trochanter/Hip:      [x] WNL  []  Wound present  Knees:                   [x] WNL  []  Wound present  Ankles:                   [x] WNL  []  Wound present  Heels:                    [x] WNL  []  Wound present  Other pressure areas:  []  Wound location       Device related: [x]  Device name: Wound Vac to abdomen          LDA completed if wound present: yes/no  Consult WOCN if necessary    Other skin related issues, ie tears, rash, etc, document in Integumentary flowsheet

## 2022-08-09 NOTE — Progress Notes (Signed)
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 s/p bilateral radical nephrectomies and  cystectomy on 06/28/22  - complicated by small bowel enterotomy and blood loss   New onset afib - on amiodarone and anticoagulation  High grade urothelial bladder CA: s/p bilateral nephrectomies and cystectomy c/b mall bowel enterotomy and blood loss (~1L) on 06/28/22  - awaiting wound debridement   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   - on aranesp  Encephalopathy - better  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  Awake.  Alert    Review of Systems:   Unobtainable due to encephalopathy    Physical Exam:     Vitals:    08/09/22 0334 08/09/22 0431 08/09/22 0546 08/09/22 0729   BP: 105/61 133/64  124/60   Pulse: 72 76  65   Resp: 17   20   Temp: 97.9 F (36.6 C)   97.3 F (36.3 C)   TempSrc: Oral   Oral   SpO2: 96%   96%   Weight:   84.6 kg (186 lb 8.2 oz)    Height:           Intake and Output Summary (Last 24 hours) at Date Time    Intake/Output Summary (Last 24 hours) at 08/09/2022 0948  Last data filed at 08/09/2022 0630  Gross per 24 hour   Intake 390 ml   Output --   Net 390 ml         General:  awake  Cardiovascular: regular rate and rhythm  Lungs: CTA B anteriorly without rhonchi or wheezing  Abdomen: soft, midline wound with clear dressing and VAC in place  Extremities: no edema, heel protectors in place  Access: R permacath     Meds:      Scheduled Meds: PRN Meds:    acetaminophen, 650 mg, Oral, 4 times per day   Or  acetaminophen, 650 mg, Oral, 4 times per day  amiodarone, 200 mg, per NG tube, Daily  apixaban, 2.5 mg, Oral, Q12H SCH  atorvastatin, 40 mg, Oral, QHS  b complex-vitamin c-folic acid, 1 tablet, Oral, Daily  carvedilol, 12.5 mg, Oral, Q12H SCH  darbepoetin alfa, 0.45 mcg/kg, Subcutaneous, Weekly  glycopyrrolate, 0.2 mg, Intravenous, Q8H  lidocaine, 20 mL, Intradermal, Once  pantoprazole, 40 mg, per NG tube, Q12H SCH  thiamine, 100 mg, per NG tube, Daily  umeclidinium-vilanterol, 1 puff, Inhalation, QAM          Continuous Infusions:         sodium chloride, ,  PRN  acetaminophen, 650 mg, Q6H PRN  albuterol-ipratropium, 3 mL, Q6H PRN  benzocaine, 1 spray, TID PRN  calcium carbonate, 500 mg, Q4H PRN  dextrose, 15 g of glucose, PRN   Or  dextrose, 12.5 g, PRN   Or  dextrose, 12.5 g, PRN   Or  glucagon (rDNA), 1 mg, PRN  diphenhydrAMINE, 25 mg, Q6H PRN  hydrALAZINE, 10 mg, Q3H PRN  HYDROmorphone, 1 mg, Q4H PRN  labetalol, 10 mg, Q15 Min PRN  lidocaine 2% jelly, , Once PRN  naloxone, 0.2 mg, PRN  ondansetron, 4 mg, Q6H PRN  oxyCODONE, 2.5 mg, Q4H PRN  phenol, 1 spray, Q2H PRN              Labs:     Recent Labs   Lab 08/08/22  0420 08/06/22  0504 08/04/22  0404   WBC 12.97* 13.91* 11.91*   Hemoglobin 8.7* 8.9* 8.7*   Hematocrit 27.9* 28.9* 26.7*   Platelet Count 237 244 263     Recent Labs   Lab 08/08/22  0420 08/06/22  0504 08/04/22  0404 08/03/22  0637   Sodium 142 141 141 140   Potassium 3.8 4.3 4.2 4.9   Chloride 103 103 103 102   CO2 27 27 25 22    BUN 23* 27* 25* 45*   Creatinine 3.9* 4.1* 3.8* 5.8*   Calcium 8.8 8.4 8.4 8.8   Albumin 1.7* 1.6* 1.6* 1.6*   Phosphorus  --   --  3.6 4.7    Magnesium  --   --  1.8 1.9   Glucose 115* 96 94 82   GFR 10.8* 10.2* 11.1* 6.7*           Signed by: Glenard Haring, MD

## 2022-08-09 NOTE — Progress Notes (Signed)
08/09/22 1500   Volunteer Chaplain   Visit Type Follow-up   Source Chaplain Initiated   Present at Visit Patient;Family members   Spiritual Care Provided to patient;family members   Reason for Visit Spiritual Support   Spiritual Care Interventions Provided prayer;Provided spiritual encouragement;Provided comfort, encouragement,affirmation   Spiritual Care Outcomes Family expressed appreciation of visit;Patient appears to have appreciated visit   Length of Visit 0-15 minutes   Follow-up No follow-up at this time

## 2022-08-09 NOTE — Progress Notes (Signed)
Wound Ostomy Continence Consultation / Progress Note    Date Time: 08/09/22 12:09 PM  Patient Name: Shelby Barron, Shelby Barron  Consulting Service: [facility] [employer] Day: 80     Reason for Consult / Follow Up   Vac Dressing change    Assessment & Plan   Assessment:   At bedside for vac change. Patient premedicated for pain. Patient crying out, comfort measures given.  Attending RN at bedside.  Patient given supplemental O2 2L via NC as O 2 sat decreased to 37f% with administration of pain med.  Attending RN remained at bedside.  Pulse Ox in place O2 sat at 97% when treatment finished.      Wound Type and Location:  Surgical Wound Mid Abdomen/RLQ Wound  Wound 06/30/22 Surgical Incision Abdomen Other (Comment) WOUND VAC applied (Active)   Date First Assessed/Time First Assessed: 06/30/22 1152   Wound Type: Surgical Incision  Location: Abdomen  Wound Location Orientation: Other (Comment)  Wound Description (Comments): WOUND VAC applied      Assessments 08/09/2022 12:00 PM   Wound Image     Wound Base Description Granulation tissue;Bleeding;Adipose;Full Thickness;Moist;Pink;Red   % Granular 80   % Non-viable 20   Structures Present Fascia   Peri-wound Description Bleeding;Dark edges;Excoriated;Fragile;Hyperpigmented   Wound Length (cm) 27 cm   Wound Width (cm) 6.8 cm   Wound Depth (cm) 6 cm   Wound Surface Area (cm^2) 183.6 cm^2   Wound Volume (cm^3) 1101.6 cm^3   Closure Open   Drainage Amount Small   Drainage Description Serosanguinous   Tunneling No   Undermining  No   Treatments Negative Pressure Wound Therapy;Cleansed   Topical Other (Comment) (Vashe wash)   Dressing Foam;Non Adherent Contact Layer      Wound Edges: Right lower Quadrant wound edges between 6 and 9 O'clock (peri wound debrided) 7 cm x 6 cm x 0.1 cm (full thickness skin exposed brown subcutaneous tissue visible, dark pink moist wound bed)  PeriWound: hyper pigmented wound between 6 and 9 O'clock, hyperpigmented (with pale pink wound edges around RLQ)      Wound care to mid abdomen/ Vac Dressing Change     Area cleansed with Vashe wash (soaked for 5-10 minutes ) and  patted dry.    Applied  No-Sting skin barrier film to peri-wound and allowed to dry.   Placed Vac drape around peri-wound window paned style, to prevent maceration and applied Eakin's ring around umbilicus, top of wound, peri wound RLQ wound and along the pannus and  curvature of pannus for more adherence.     Packed 1 pc (cut into two pieces) of silver non adherent dressing and 3 pieces of of black foam into the wound (two pieces mid-abdomen bridged to RLQ peri wound 1 pc)  Secured with vac drape.   A quarter size cut hole was cut into the black foam to fit the trackpad.   Good seal obtained and wound vacuum setting at 125 mm Hg, low intensity continuous.    Aseptic technique utilized          Vashe Wound Solution helps to cleanse the wound and accomplish the goals of wound bed preparation in a biocompatible, safe, effective, and natural way. This solution of hypochlorous acid (HOCl) acts as a preservative that inhibits microbial contamination within the solution. Vashe Wound Solution has the highest concentration of pure HOCl.     Vashe comes from supply rooms or central supply.     Braden: Braden Scale Score: 11 (08/08/22  2112)    Braden Subscales:  Sensory Perceptions: Very limited (08/08/22 2112)  Moisture: Occasionally moist (08/08/22 2112)  Activity: Bedfast (08/08/22 2112)  Mobility: Very limited (08/08/22 2112)  Nutrition: Probably inadequate (08/08/22 2112)  Friction and Shear: Problem (08/08/22 2112)    Ht Readings from Last 1 Encounters:   07/18/22 1.575 m (5\' 2" )     Wt Readings from Last 3 Encounters:   08/09/22 84.6 kg (186 lb 8.2 oz)   06/25/22 87.1 kg (192 lb)   06/23/22 84.8 kg (187 lb)     Body mass index is 34.11 kg/m.    Wound Photography:              Plan/Follow-Up:   Continue pressure prevention bundle:     Head of the bed 30 degrees or less  Positioning device to the  bedside  Eliminate/minimize pressure from the area  Float heels with boots or pillows  Turn patient  Pressure redistribution cushion to the chair  Use lift sheets/low friction surface sheets for positioning.  Pad bony prominences  Nutrition consult/Optimize nutrition  Initiate bed algorithm/Specialty bed  Moisture/Incontinence management - Cleanse with incontinence cleansing wipe/water to manage incontinence and protect skin from exposure to urine and stool. Apply skin barrier protection cream. Apply [place]/external or female external urine management system to prevent urinary contamination of a wound. Apply rectal pouch/fecal management system per unit policy, to prevent fecal contamination of a wound or contain diarrhea.            1.  Wound care orders entered into EMR.   2.  Care rendered.  3.  RN checks wound VAC Q 2 hr: Sponge contracted;  pump "ON" and plugged in.    4.  If unable to maintain the seal, apply Tegaderm dressing or vac drape to seal. May also contact [facility] at 1 - [phone] to troubleshoot.  5.  If still unable to maintain the seal and vac off or not working for 2 hours, remove all foam and place hydrogel moistened gauze into wound bed, cover with ABD pad secured with tape, and notify Physician and WOC team.          6.  Change two-to three times a week. Next vac change due on Thursday     When patient discharged or VAC discontinued -   Remove all foam dressing and non adherent dressing and place hydrogel moistened gauze into the wound bed, cover with ABD pad tape to secure.   Wound Vac machine - discard canister and tubing.    Place wound vac in CLEAR plastic bag and place in the soiled utility room. DO NOT USE RED BIOHAZARD BAGS.    Please contact WOC office [phone] and leave a message for WOC to pick up.        Discharge VAC Wound Information     Prescribed [facility] VAC Therapy for duration:  4 months      Discharge VAC Wound Information     Surgical Wound  mid abdomen/RLQ  Age of wound:  06/28/22  Did VAC initiate at the hospital?     yes         Initiated - 06/28/22  Compromised Nutritional status? no Nutrition consulted for wound care needs.  Other therapies previously tried: no.  Is there eschar present in the wound: no  Has wound been debrided in the last ten days:   yes 08/01/22  Are serial debridements required: no  Location: mid abdomen and RLQ  Measurements: Date- 08/09/22 Surgical Wound Midline                            Length- 27 cm                             Width- 6.8 cm                            Depth-  6 cm                            Undermining- no  cm                            Tunneling-  no cm     Right lower Quadrant wound edges between 6 and 9 O'clock (peri wound debrided) 7 cm x 6 cm x 0.1 cm (full thickness skin exposed subq tissue visible)wound bed red, pink and tan sub cutaneous tissue     The appearance of wound bed and color: red, pink, brown  Exudate: sanguinous  Is wound full-thickness: yes  Is bone, tendon or muscle exposed: yes  Is there undermining no  Is there tunneling no     7.   Type of Foam used: black simplace   Number of pieces used: 3 black  (3 pieces of of black foam into the wound -two pieces mid-abdomen bridged to RLQ peri wound 1 pc)   Contact layer applied: silver non adherent  Continuous negative pressure setting: 125 mm Hg, low, intensity continuous     Has the WOCN team attempted other dressings: no  WOCN assessed clinical reasons for the continuation of VAC at home:  Promotes wound healing, Speed granulation tissue, prevent infection             Specialty Bed: [employer] Mattress (Med-Surg) - Innovative support surfaces help manage pressure, shear and moisture to deliver optimal wound prevention and healing.        History of Present Illness   This is a 82 y.o. female  has a past medical history of Acid reflux, Asthma, well controlled, Cancer of kidney, Chronic lower back pain, Congestive heart disease, COPD (chronic obstructive pulmonary  disease), CVA (cerebral infarction) (01/12/2003), Diabetes, Diabetic nephropathy, Fibroids, Gout, H/O: gout, Hyperlipidemia, Hypertension, Neuropathy of hand, and SOB (shortness of breath)..  Admitted with Bladder cancer.        Procedure(s) with comments:  TUNNELED CATH REMOVAL - right neck/chest tunneled HD catheter removal    42 Days Post-Op  -------------------  **Canceled**    Procedure(s) with comments:  TUNNELED CATH REMOVAL - right neck/chest tunneled HD catheter removal    * No surgery date entered *  -------------------    Procedure(s) with comments:  TUNNELED CATH REMOVAL - right neck/chest tunneled HD catheter removal    40 Days Post-Op  -------------------    Procedure(s) with comments:  TUNNELED CATH REMOVAL - right neck/chest tunneled HD catheter removal    26 Days Post-Op  -------------------    Procedure(s) with comments:  TUNNELED CATH REMOVAL - right neck/chest tunneled HD catheter removal    26 Days Post-Op  -------------------        From Nursing/Other Documentation:   Bowel Incontinence: No (08/06/22 1212)  Last BM Date: 08/06/22 (08/08/22 2112)  Urinary  Incontinence: Yes (08/08/22 0800)    Ht Readings from Last 1 Encounters:   07/18/22 1.575 m (5\' 2" )     Wt Readings from Last 3 Encounters:   08/09/22 84.6 kg (186 lb 8.2 oz)   06/25/22 87.1 kg (192 lb)   06/23/22 84.8 kg (187 lb)     Body mass index is 34.11 kg/m.    No results for input(s): "WBC", "RBC", "HGB", "HCT", "LABPLAT", "NA", "K", "CL", "CO2", "BUN", "CREAT", "CA", "ALB", "EGFR", "GLU" in the last 24 hours.    Invalid input(s): "WHITEBLOODCE"  Lab Results   Component Value Date    HGBA1C 5.6 07/14/2022    HGBA1C 8.5 (H) 05/27/2022    GLU 115 (H) 08/08/2022    LDL 23 07/14/2022    CREAT 3.9 (H) 08/08/2022     Recent Labs   Lab 08/08/22  2139 08/07/22  0728 08/06/22  2123 08/06/22  1722 08/06/22  1150 08/06/22  0749 08/05/22  2125 08/05/22  1644 08/05/22  1354 08/05/22  0751   Whole Blood Glucose POCT 154* 101* 141* 98 162* 110* 133*  99 117* 94     No results for input(s): "CK" in the last 72 hours.            From Nursing/Other Documentation:   Bowel Incontinence: No (08/06/22 1212)  Last BM Date: 08/06/22 (08/08/22 2112)  Urinary Incontinence: Yes (08/08/22 0800)    Ht Readings from Last 1 Encounters:   07/18/22 1.575 m (5\' 2" )     Wt Readings from Last 3 Encounters:   08/09/22 84.6 kg (186 lb 8.2 oz)   06/25/22 87.1 kg (192 lb)   06/23/22 84.8 kg (187 lb)     Body mass index is 34.11 kg/m.    No results for input(s): "WBC", "RBC", "HGB", "HCT", "LABPLAT", "NA", "K", "CL", "CO2", "BUN", "CREAT", "CA", "ALB", "EGFR", "GLU" in the last 24 hours.    Invalid input(s): "WHITEBLOODCE"  Lab Results   Component Value Date    HGBA1C 5.6 07/14/2022    HGBA1C 8.5 (H) 05/27/2022    GLU 115 (H) 08/08/2022    LDL 23 07/14/2022    CREAT 3.9 (H) 08/08/2022     Recent Labs   Lab 08/08/22  2139 08/07/22  0728 08/06/22  2123 08/06/22  1722 08/06/22  1150 08/06/22  0749 08/05/22  2125 08/05/22  1644 08/05/22  1354 08/05/22  0751   Whole Blood Glucose POCT 154* 101* 141* 98 162* 110* 133* 99 117* 94     No results for input(s): "CK" in the last 72 hours.                Nikolos Billig "Pepper" Yvonna Brun  MSN, RN, WOCN  [facility]  Wound, Ostomy, and  Continence Coordinator  [phone] Office  [phone] [employer]  [email or web address]

## 2022-08-09 NOTE — Plan of Care (Signed)
NURSING SHIFT NOTE     Patient: Shelby Barron  Day: 01      SHIFT EVENTS     Shift Narrative/Significant Events (PRN med administration, fall, RRT, etc.):     Pt is A&Ox2, oriented to time and place. Pt is on RA. Scheduled meds given. Meds crushed and given with applesauce. Wound/skin care completed. Pt repositioned every 2 hours. Wound vac to abdomen in place. SCDs in place. Bed in lowest position with bed alarm on. [name] updated on pt's care. Plan of care ongoing.     Safety and fall precautions remain in place. Purposeful rounding completed.          ASSESSMENT     Changes in assessment from patient's baseline this shift:    Neuro: No  CV: No  Pulm: No  Peripheral Vascular: No  HEENT: No  GI: No  BM during shift: No, Last BM Date: 08/06/22  GU: No   Integ: No  MS: No    Pain: Improved  Pain Interventions: Medications  Medications Utilized: Oxycodone oral     Mobility: PMP Activity: Step 3 - Bed Mobility of Distance Walked (ft) (Step 6,7): 0 Feet           Lines     Patient Lines/Drains/Airways Status       Active Lines, Drains and Airways       Name Placement date Placement time Site Days    Permacath Catheter - Tunneled 07/14/22 Subclavian vein catheter Right 07/14/22  1404  -- 25    Midline IV 07/01/22 Anterior;Left Upper Arm 07/01/22  1315  Upper Arm  38                         VITAL SIGNS     Vitals:    08/09/22 0334   BP: 105/61   Pulse: 72   Resp: 17   Temp: 97.9 F (36.6 C)   SpO2: 96%       Temp  Min: 97 F (36.1 C)  Max: 99 F (37.2 C)  Pulse  Min: 68  Max: 92  Resp  Min: 16  Max: 18  BP  Min: 105/61  Max: 132/70  SpO2  Min: 96 %  Max: 99 %      Intake/Output Summary (Last 24 hours) at 08/09/2022 0416  Last data filed at 08/08/2022 1302  Gross per 24 hour   Intake 370 ml   Output 0 ml   Net 370 ml          CARE PLAN        Problem: Moderate/High Fall Risk Score >5  Goal: Patient will remain free of falls  Flowsheets (Taken 08/08/2022 1920)  High (Greater than 13):   HIGH-Visual cue at entrance  to patient's room   HIGH-Bed alarm on at all times while patient in bed   HIGH-Utilize chair pad alarm for patient while in the chair   HIGH-Apply yellow "Fall Risk" arm band   HIGH-Initiate use of floor mats as appropriate   HIGH-Consider use of low bed     Problem: Compromised Sensory Perception  Goal: Sensory Perception Interventions  Flowsheets (Taken 08/08/2022 2112)  Sensory Perception Interventions: Offload heels, Pad bony prominences, Reposition q 2hrs/turn Clock, Q2 hour skin assessment under devices if present     Problem: Compromised Moisture  Goal: Moisture level Interventions  Flowsheets (Taken 08/08/2022 2112)  Moisture level Interventions: Moisture wicking products, Moisture barrier cream  Problem: Compromised Activity/Mobility  Goal: Activity/Mobility Interventions  Flowsheets (Taken 08/08/2022 2112)  Activity/Mobility Interventions: Pad bony prominences, TAP Seated positioning system when OOB, Promote PMP, Reposition q 2 hrs / turn clock, Offload heels     Problem: Compromised Friction/Shear  Goal: Friction and Shear Interventions  Flowsheets (Taken 08/08/2022 2112)  Friction and Shear Interventions: Pad bony prominences, Off load heels, HOB 30 degrees or less unless contraindicated, Consider: TAP seated positioning, Heel foams     Problem: Inadequate Airway Clearance  Goal: Patent Airway maintained  Flowsheets (Taken 08/05/2022 1255 by Wess Botts, RN)  Patent airway maintained:   Position patient for maximum ventilatory efficiency   Provide adequate fluid intake to liquefy secretions   Suction secretions as needed   Reinforce use of ordered respiratory interventions (i.e. CPAP, BiPAP, Incentive Spirometer, Acapella, etc.)   Reposition patient every 2 hours and as needed unless able to self-reposition     Problem: Constipation  Goal: Fluid and electrolyte balance are achieved/maintained  Flowsheets (Taken 08/08/2022 1803 by Wess Botts, RN)  Fluid and electrolyte balance are  achieved/maintained:   Monitor/assess lab values and report abnormal values   Assess and reassess fluid and electrolyte status   Observe for cardiac arrhythmias   Monitor for muscle weakness     Problem: Safety  Goal: Patient will be free from injury during hospitalization  Flowsheets (Taken 08/05/2022 1255 by Wess Botts, RN)  Patient will be free from injury during hospitalization:   Assess patient's risk for falls and implement fall prevention plan of care per policy   Use appropriate transfer methods   Provide and maintain safe environment   Include patient/ family/ care giver in decisions related to safety   Ensure appropriate safety devices are available at the bedside   Hourly rounding   Assess for patients risk for elopement and implement Elopement Risk Plan per policy   Provide alternative method of communication if needed (communication boards, writing)  Goal: Patient will be free from infection during hospitalization  Flowsheets (Taken 08/05/2022 1255 by Wess Botts, RN)  Free from Infection during hospitalization:   Monitor lab/diagnostic results   Monitor all insertion sites (i.e. indwelling lines, tubes, urinary catheters, and drains)   Assess and monitor for signs and symptoms of infection   Encourage patient and family to use good hand hygiene technique     Problem: Pain  Goal: Pain at adequate level as identified by patient  Flowsheets (Taken 08/08/2022 1803 by Wess Botts, RN)  Pain at adequate level as identified by patient:   Identify patient comfort function goal   Assess for risk of opioid induced respiratory depression, including snoring/sleep apnea. Alert healthcare team of risk factors identified.   Assess pain on admission, during daily assessment and/or before any "as needed" intervention(s)   Reassess pain within 30-60 minutes of any procedure/intervention, per Pain Assessment, Intervention, Reassessment (AIR) Cycle   Evaluate if patient comfort function goal is met   Evaluate  patient's satisfaction with pain management progress   Offer non-pharmacological pain management interventions   Consult/collaborate with Pain Service   Consult/collaborate with Physical Therapy, Occupational Therapy, and/or Speech Therapy     Problem: Side Effects from Pain Analgesia  Goal: Patient will experience minimal side effects of analgesic therapy  Flowsheets (Taken 08/08/2022 1803 by Wess Botts, RN)  Patient will experience minimal side effects of analgesic therapy:   Monitor/assess patient's respiratory status (RR depth, effort, breath sounds)   Prevent/manage side effects per LIP orders (i.e. nausea, vomiting, pruritus,  constipation, urinary retention, etc.)   Assess for changes in cognitive function   Evaluate for opioid-induced sedation with appropriate assessment tool (i.e. POSS)     Problem: Renal Instability  Goal: Fluid and electrolyte balance are achieved/maintained  Flowsheets (Taken 08/08/2022 1803 by Wess Botts, RN)  Fluid and electrolyte balance are achieved/maintained:   Monitor/assess lab values and report abnormal values   Assess and reassess fluid and electrolyte status   Monitor for muscle weakness   Follow fluid restrictions/IV/PO parameters     Problem: Patient Receiving Advanced Renal Therapies  Goal: Therapy access site remains intact  Flowsheets (Taken 08/08/2022 1803 by Wess Botts, RN)  Therapy access site remains intact:   Assess therapy access site   Change therapy access site dressing as needed     Problem: Fluid and Electrolyte Imbalance/ Endocrine  Goal: Fluid and electrolyte balance are achieved/maintained  Flowsheets (Taken 08/08/2022 1803 by Wess Botts, RN)  Fluid and electrolyte balance are achieved/maintained:   Monitor/assess lab values and report abnormal values   Assess and reassess fluid and electrolyte status   Observe for cardiac arrhythmias   Monitor for muscle weakness  Goal: Adequate hydration  Flowsheets (Taken 08/05/2022 1255 by Wess Botts,  RN)  Adequate hydration:   Assess mucus membranes, skin color, turgor, perfusion and presence of edema   Assess for peripheral, sacral, periorbital and abdominal edema   Monitor and assess vital signs and perfusion     Problem: Inadequate Gas Exchange  Goal: Patent Airway maintained  Flowsheets (Taken 08/05/2022 1255 by Wess Botts, RN)  Patent airway maintained:   Position patient for maximum ventilatory efficiency   Provide adequate fluid intake to liquefy secretions   Suction secretions as needed   Reinforce use of ordered respiratory interventions (i.e. CPAP, BiPAP, Incentive Spirometer, Acapella, etc.)   Reposition patient every 2 hours and as needed unless able to self-reposition     Problem: Neurological Deficit  Goal: Neurological status is stable or improving  Flowsheets (Taken 08/07/2022 0132 by Lucky Rathke, RN)  Neurological status is stable or improving: Perform CAM Assessment     Problem: Every Day - Stroke  Goal: Neurological status is stable or improving  Flowsheets (Taken 08/07/2022 0132 by Lucky Rathke, RN)  Neurological status is stable or improving: Perform CAM Assessment     Problem: Pain interferes with ability to perform ADL  Goal: Pain at adequate level as identified by patient  Flowsheets (Taken 08/08/2022 1803 by Wess Botts, RN)  Pain at adequate level as identified by patient:   Identify patient comfort function goal   Assess for risk of opioid induced respiratory depression, including snoring/sleep apnea. Alert healthcare team of risk factors identified.   Assess pain on admission, during daily assessment and/or before any "as needed" intervention(s)   Reassess pain within 30-60 minutes of any procedure/intervention, per Pain Assessment, Intervention, Reassessment (AIR) Cycle   Evaluate if patient comfort function goal is met   Evaluate patient's satisfaction with pain management progress   Offer non-pharmacological pain management interventions    Consult/collaborate with Pain Service   Consult/collaborate with Physical Therapy, Occupational Therapy, and/or Speech Therapy     Problem: Everyday - Heart Failure  Goal: Mobility/Activity is Maintained at Optimal Level for Patient  Flowsheets (Taken 08/08/2022 1803 by Wess Botts, RN)  Mobility/Activity is Maintained at Optimal Level for Patient:   Increase mobility as tolerated/progressive mobility protocol   Maintain SCD's as Ordered   Perform active/passive ROM   Reposition patient every 2 hours and  as needed unless able to reposition self   Assess for changes in respiratory status, level of consciousness and/or development of fatigue

## 2022-08-10 ENCOUNTER — Inpatient Hospital Stay: Payer: No Typology Code available for payment source

## 2022-08-10 LAB — COMPREHENSIVE METABOLIC PANEL
ALT: 10 U/L (ref 0–55)
ALT: 8 U/L (ref 0–55)
AST (SGOT): 11 U/L (ref 5–41)
AST (SGOT): 11 U/L (ref 5–41)
Albumin/Globulin Ratio: 0.4 — ABNORMAL LOW (ref 0.9–2.2)
Albumin/Globulin Ratio: 0.5 — ABNORMAL LOW (ref 0.9–2.2)
Albumin: 1.5 g/dL — ABNORMAL LOW (ref 3.5–5.0)
Albumin: 1.5 g/dL — ABNORMAL LOW (ref 3.5–5.0)
Alkaline Phosphatase: 91 U/L (ref 37–117)
Alkaline Phosphatase: 85 U/L (ref 37–117)
Anion Gap: 16 — ABNORMAL HIGH (ref 5.0–15.0)
Anion Gap: 14 (ref 5.0–15.0)
BUN: 50 mg/dL — ABNORMAL HIGH (ref 7–21)
BUN: 22 mg/dL — ABNORMAL HIGH (ref 7–21)
Bilirubin, Total: 0.3 mg/dL (ref 0.2–1.2)
Bilirubin, Total: 0.2 mg/dL (ref 0.2–1.2)
CO2: 25 mEq/L (ref 17–29)
CO2: 24 mEq/L (ref 17–29)
Calcium: 8.8 mg/dL (ref 7.9–10.2)
Calcium: 8.1 mg/dL (ref 7.9–10.2)
Chloride: 100 mEq/L (ref 99–111)
Chloride: 105 mEq/L (ref 99–111)
Creatinine: 6.2 mg/dL — ABNORMAL HIGH (ref 0.4–1.0)
Creatinine: 3.6 mg/dL — ABNORMAL HIGH (ref 0.4–1.0)
GFR: 6.2 mL/min/{1.73_m2} — ABNORMAL LOW (ref >=60.0)
GFR: 11.9 mL/min/{1.73_m2} — ABNORMAL LOW (ref >=60.0)
Globulin: 3.4 g/dL (ref 2.0–3.6)
Globulin: 3.1 g/dL (ref 2.0–3.6)
Glucose: 83 mg/dL (ref 70–100)
Glucose: 126 mg/dL — ABNORMAL HIGH (ref 70–100)
Potassium: 4.9 mEq/L (ref 3.5–5.3)
Potassium: 4.1 mEq/L (ref 3.5–5.3)
Protein, Total: 4.9 g/dL — ABNORMAL LOW (ref 6.0–8.3)
Protein, Total: 4.6 g/dL — ABNORMAL LOW (ref 6.0–8.3)
Sodium: 141 mEq/L (ref 135–145)
Sodium: 143 mEq/L (ref 135–145)

## 2022-08-10 LAB — CBC
Absolute nRBC: 0 10*3/uL (ref <=0.00)
Hematocrit: 26.7 % — ABNORMAL LOW (ref 34.7–43.7)
Hemoglobin: 8.3 g/dL — ABNORMAL LOW (ref 11.4–14.8)
MCH: 28.8 pg (ref 25.1–33.5)
MCHC: 31.1 g/dL — ABNORMAL LOW (ref 31.5–35.8)
MCV: 92.7 fL (ref 78.0–96.0)
MPV: 10.1 fL (ref 8.9–12.5)
Platelet Count: 304 10*3/uL (ref 142–346)
RBC: 2.88 10*6/uL — ABNORMAL LOW (ref 3.90–5.10)
RDW: 15 % (ref 11–15)
WBC: 12.93 10*3/uL — ABNORMAL HIGH (ref 3.10–9.50)
nRBC %: 0 /100 WBC (ref <=0.0)

## 2022-08-10 LAB — LAB USE ONLY - CBC WITH DIFFERENTIAL
Absolute Basophils: 0.05 10*3/uL (ref 0.00–0.08)
Absolute Eosinophils: 0.02 10*3/uL (ref 0.00–0.44)
Absolute Immature Granulocytes: 0.16 10*3/uL — ABNORMAL HIGH (ref 0.00–0.07)
Absolute Lymphocytes: 2.77 10*3/uL (ref 0.42–3.22)
Absolute Monocytes: 1.36 10*3/uL — ABNORMAL HIGH (ref 0.21–0.85)
Absolute Neutrophils: 6.79 10*3/uL — ABNORMAL HIGH (ref 1.10–6.33)
Absolute nRBC: 0 10*3/uL (ref <=0.00)
Basophils %: 0.4 %
Eosinophils %: 0.2 %
Hematocrit: 25.2 % — ABNORMAL LOW (ref 34.7–43.7)
Hemoglobin: 8 g/dL — ABNORMAL LOW (ref 11.4–14.8)
Immature Granulocytes %: 1.4 %
Lymphocytes %: 24.8 %
MCH: 29.3 pg (ref 25.1–33.5)
MCHC: 31.7 g/dL (ref 31.5–35.8)
MCV: 92.3 fL (ref 78.0–96.0)
MPV: 10 fL (ref 8.9–12.5)
Monocytes %: 12.2 %
Neutrophils %: 61 %
Platelet Count: 292 10*3/uL (ref 142–346)
Preliminary Absolute Neutrophil Count: 6.79 10*3/uL — ABNORMAL HIGH (ref 1.10–6.33)
RBC: 2.73 10*6/uL — ABNORMAL LOW (ref 3.90–5.10)
RDW: 15 % (ref 11–15)
WBC: 11.15 10*3/uL — ABNORMAL HIGH (ref 3.10–9.50)
nRBC %: 0 /100 WBC (ref <=0.0)

## 2022-08-10 LAB — ARTERIAL BLOOD GAS
Arterial Base Excess: 1.3 mEq/L (ref <=2.7)
Arterial CO2: 22.8 mEq/L — ABNORMAL LOW (ref 23.0–30.0)
Arterial HCO3: 24 mEq/L (ref 23.0–28.0)
Arterial O2 Saturation: 98.6 % — ABNORMAL HIGH (ref 94.0–98.0)
Arterial pCO2: 31.2 mmHg — ABNORMAL LOW (ref 32.0–48.0)
Arterial pH: 7.499 — ABNORMAL HIGH (ref 7.350–7.450)
Arterial pO2: 121 mmHg — ABNORMAL HIGH (ref 83.0–108.0)
O2 Liter Flow: 4 L/min
Temperature: 37 C

## 2022-08-10 LAB — WHOLE BLOOD GLUCOSE POCT: Whole Blood Glucose POCT: 138 mg/dL — ABNORMAL HIGH (ref 70–100)

## 2022-08-10 LAB — HIGH SENSITIVITY TROPONIN-I: hs Troponin: 7.1 ng/L (ref <=14.0)

## 2022-08-10 MED ORDER — SODIUM CHLORIDE 0.9 % IV BOLUS
100.0000 mL | INTRAVENOUS | Status: AC | PRN
Start: 2022-08-10 — End: 2022-08-10

## 2022-08-10 MED ORDER — POLYETHYLENE GLYCOL 3350 17 G PO PACK
17.0000 g | PACK | Freq: Every day | ORAL | Status: DC | PRN
Start: 2022-08-10 — End: 2022-09-03

## 2022-08-10 MED ORDER — NALOXONE HCL 0.4 MG/ML IJ SOLN (WRAP)
0.4000 mg | INTRAMUSCULAR | Status: DC | PRN
Start: 2022-08-10 — End: 2022-08-18

## 2022-08-10 MED ORDER — SODIUM CHLORIDE 0.9 % IV BOLUS
250.0000 mL | Freq: Once | INTRAVENOUS | Status: AC
Start: 2022-08-10 — End: 2022-08-11
  Administered 2022-08-10: 250 mL via INTRAVENOUS

## 2022-08-10 MED ORDER — SODIUM CHLORIDE 0.9 % IV BOLUS
250.0000 mL | INTRAVENOUS | Status: AC | PRN
Start: 2022-08-10 — End: 2022-08-10

## 2022-08-10 NOTE — Significant Event (Signed)
Responded to a RAPID RESPONSE called overhead.  Per nursing staff patient had been discharged and when transferring her to the stretcher she suddenly became unresponsive, hypotensive and hypoxic.  Upon my arrival patient was lethargic, minimally responsive to sternal rub with eye opening however was aphasic.  Vital signs noted hypotension with systolic in the 80's, O2 saturation was 98% on 4L.  Glucose 136.  Patient received bolus NS, 0.4mg  Narcan with minimal improvement.  EKG obtained was unremarkable.  STAT labs including CMP, CBC, Troponin were obtained, CT head/ CTA head & neck and CT perfusion were ordered.  Discussed with ICU attending who evaluated patient at bedside and graciously accepted patient to transfer for a higher level of care.

## 2022-08-10 NOTE — Progress Notes (Addendum)
LOS # [id number]      Summary of Discharge Plan:Private Pay at [facility]       Identified Possible Discharge Barriers:payment      CM Interventions and Outcome: Room number is 310 and Report [phone] Ext [phone]     CM spoke to liaison [place], [phone], pt daughter is to go to [facility] with payment. Liaison to call CM once payment is complete. CM to f/u.     CM contacted [employer]. Contact person Marya Landry at [phone].  Company is aware and awaiting call for pt's pick up. Per Elnora Morrison, transportation is private pay, unaware of need for pickup. CM contacted daughter to inform her of above, daughter would like transportation through insurance.     [employer] stretcher transport trip number [phone], pickup window between 3:19 pm-7:19 pm with a (possible pickup at 6:25 pm).    Discussed above Discharge Plan with (patient, family, Care Team, others): yes      Case Management will continue to follow for [place] needs that may arise.    Sherryll Burger BSN, RN CM  RN Case Manager I  [facility]  [phone]

## 2022-08-10 NOTE — Progress Notes (Signed)
Hd tx completed x3.5hrs, unable to removed fluids, pt bp is running low while on tx, awake & responsive, no change of baseline from arrival, Report given to [facility] RN     08/10/22 1239   Treatment Summary   Time Off Machine 1230   Duration of Treatment (Hours) 3.5   Treatment Type 2:1   Dialyzer Clearance Moderately streaked   Fluid Volume Off (mL) 1000   Prime Volume (mL) 200   Rinseback Volume (mL) 200   Fluid Given: Normal Saline (mL) 200   Fluid Given: PRBC  0 mL   Fluid Given: Albumin (mL) 0   Fluid Given: Other (mL) 0   Total Fluid Given 600   Hemodialysis Net Fluid Removed 400   Post Treatment Assessment   Post-Treatment Weight (Kg) -400   Patient Response to Treatment tolerated well   Additional Dialyzer Used 0   Permacath Catheter - Tunneled 07/14/22 Subclavian vein catheter Right   Placement Date/Time: 07/14/22 1404   Inserted by: Dr. Welton Flakes  Access Type: Subclavian vein catheter  Orientation: Right  Central Line Infection Prevention Education provided?: Yes  Hand Hygiene: Alcohol based hand scrub;Soap and water  Line cart u...   Line necessity reviewed? Apheresis/hemodialysis   Site Assessment Clean;Intact;Dry   Catheter Lumen Volume Venous 1.6 mL   Catheter Lumen Volume Arterial 1.6 mL   Dressing Status and Intervention Dressing Intact;Clean & Dry   Tego/Curos Caps on Catheter Yes   NEW Tego/Curos Caps placed (Date) 08/10/22   APHERESIS ONLY:  Access  Red   APHERESIS ONLY: Return Blue   End Caps Free From Blood Yes   Line Care Connections checked and tightened   Dressing Type Transparent;Biopatch   Dressing Status Clean;Dry;Intact   Dressing/Line Intervention Caps changed   Vitals   Temp 98.7 F (37.1 C)   Heart Rate 81   Resp Rate 15   BP 129/53   O2 Device None (Room air)   Assessment   Mental Status Alert;Oriented;Cooperative   Cardiac (WDL) WDL   Cardiac Regularity Irregular   Cardiac Symptoms Return to [facility]   Respiratory  WDL   Respiratory Pattern Return to WDL   Bilateral Breath Sounds  Diminished   R Breath Sounds Diminished   L Breath Sounds Diminished   Cough Spontaneous   Edema  WDL   Generalized Edema Non Pitting Edema   Facial Return to WDL   RLE Edema Non Pitting Edema   LLE Edema Non Pitting Edema   General Skin Color Appropriate for ethnicity   Skin Temp Warm   Gastrointestinal (WDL) WDL   Abdomen Inspection Open abdomen  (wound cvac)   Mobility Bed   Pain Assessment   Charting Type Reassessment   Pain Scale Used Numeric Scale (0-10)   Numeric Pain Scale   Pain Score 0   POSS Score 1   Multiple Pain Sites No   Education   Person taught Patient   Knowledge basis Substantial   Topics taught Procedure   Teaching Tools Explain   Reponse Unable to Teach Back (see Comments)   Bedside Nurse Communication   Name of bedside RN - post dialysis Oluchi Umekwe

## 2022-08-10 NOTE — Progress Notes (Signed)
Infectious Diseases & Tropical Medicine  Progress Note    08/10/2022   Shelby Barron JWJ:[id number] is a 82 y.o. female,       Assessment:     Encephalopathy  Acute CVA  History of bladder cancer and renal failure  Status post radical cystectomy/bilateral nephrectomy, enterectomy and lysis of adhesion (06/28/2022)  S/p small bowel resection  Abdominal wall wound s/p bedside debridement/wound VAC placement (08/02/2022)  End-stage renal disease on hemodialysis  Atrial fibrillation  Risk of aspiration    Plan:     Likely discharge today  Continue monitoring without antibiotics  Continue local wound care  Continue probiotics  Monitor electrolytes and renal functions closely  Monitor clinically    ROS:     General: No fever, awake, comfortable, confused  Respiratory: No cough or shortness of breath  Gastrointestinal: No abdominal pain  Genito-Urinary: no hematuria  Musculoskeletal: no edema  Neurological: awake  Dermatological: No rash    Physical Examination:     Blood pressure 155/62, pulse 79, temperature 97.3 F (36.3 C), resp. rate 17, height 1.575 m (5\' 2" ), weight 85.9 kg (189 lb 6 oz), SpO2 93%.    General Appearance: Awake  HEENT: Pupils are equal, round, and reactive to light.   Lungs: Clear to auscultation  Heart: Regular rate and rhythm  Chest: Symmetric chest wall expansion.   Abdomen: soft, no hepatosplenomegaly, no bowel sounds, abdominal wall wound VAC in place  Neurological: awake  Extremities: No edema    Laboratory And Diagnostic Studies:     Recent Labs     08/10/22  0430 08/08/22  0420   WBC 12.93* 12.97*   Hemoglobin 8.3* 8.7*   Hematocrit 26.7* 27.9*   Platelet Count 304 237     Recent Labs     08/10/22  0430 08/08/22  0420   Sodium 141 142   Potassium 4.9 3.8   Chloride 100 103   CO2 25 27   BUN 50* 23*   Creatinine 6.2* 3.9*   Glucose 83 115*   Calcium 8.8 8.8     Recent Labs     08/10/22  0430 08/08/22  0420   AST (SGOT) 11 10   ALT 10 8   Alkaline Phosphatase 91 95   Protein,  Total 4.9* 5.0*   Albumin 1.5* 1.7*         Current Meds:      Scheduled Meds: PRN Meds:    acetaminophen, 650 mg, Oral, 4 times per day   Or  acetaminophen, 650 mg, Oral, 4 times per day  amiodarone, 200 mg, per NG tube, Daily  apixaban, 2.5 mg, Oral, Q12H SCH  atorvastatin, 40 mg, Oral, QHS  b complex-vitamin c-folic acid, 1 tablet, Oral, Daily  carvedilol, 12.5 mg, Oral, Q12H SCH  darbepoetin alfa, 0.45 mcg/kg, Subcutaneous, Weekly  glycopyrrolate, 0.2 mg, Intravenous, Q8H  lidocaine, 20 mL, Intradermal, Once  pantoprazole, 40 mg, per NG tube, Q12H SCH  senna-docusate, 1 tablet, Oral, QHS  thiamine, 100 mg, per NG tube, Daily  umeclidinium-vilanterol, 1 puff, Inhalation, QAM        Continuous Infusions:       sodium chloride, , PRN  acetaminophen, 650 mg, Q6H PRN  albuterol-ipratropium, 3 mL, Q6H PRN  benzocaine, 1 spray, TID PRN  calcium carbonate, 500 mg, Q4H PRN  dextrose, 15 g of glucose, PRN   Or  dextrose, 12.5 g, PRN   Or  dextrose, 12.5 g, PRN   Or  glucagon (rDNA), 1 mg,  PRN  diphenhydrAMINE, 25 mg, Q6H PRN  hydrALAZINE, 10 mg, Q3H PRN  HYDROmorphone, 1 mg, Q4H PRN  labetalol, 10 mg, Q15 Min PRN  lidocaine 2% jelly, , Once PRN  naloxone, 0.2 mg, PRN  ondansetron, 4 mg, Q6H PRN  oxyCODONE, 2.5 mg, Q4H PRN  phenol, 1 spray, Q2H PRN  polyethylene glycol, 17 g, Daily PRN  sodium chloride, 100 mL, Q1H PRN  sodium chloride, 250 mL, PRN          Lestine Rahe A. Janalyn Rouse, M.D.  08/10/2022  9:19 AM

## 2022-08-10 NOTE — Progress Notes (Signed)
Called [employer] at [phone] and spoke to [place] who stated that the patient does have [employer] with a $200.00 copay that will be billed. Transport company is [employer] at [phone]. Ref #[id number].     Called [employer] at [phone] and spoke to [place]. [employer] stretcher transport is going to [facility] [address] [place], [place] [address] into [address] and nurse report [phone] Ext [phone], is set up for today between 3:19 pm-7:19 pm with a (possible pickup at 6:25 pm) under trip [phone]. Rep stated that the driver will contact the nurse at ext [phone] with the exact ETA.     Brock Bad, CMS

## 2022-08-10 NOTE — Plan of Care (Signed)
Problem: Renal Instability  Goal: Fluid and electrolyte balance are achieved/maintained  Flowsheets (Taken 08/08/2022 1803 by Wess Botts, RN)  Fluid and electrolyte balance are achieved/maintained:   Monitor/assess lab values and report abnormal values   Assess and reassess fluid and electrolyte status   Monitor for muscle weakness   Follow fluid restrictions/IV/PO parameters     Problem: Patient Receiving Advanced Renal Therapies  Goal: Therapy access site remains intact  Flowsheets (Taken 08/08/2022 1803 by Wess Botts, RN)  Therapy access site remains intact:   Assess therapy access site   Change therapy access site dressing as needed

## 2022-08-10 NOTE — Plan of Care (Signed)
NURSING SHIFT NOTE     Patient: Shelby Barron  Day: 16      SHIFT EVENTS     Shift Narrative/Significant Events (PRN med administration, fall, RRT, etc.):     Pt is A&Ox2, oriented to person and place. Pt is on RA. Scheduled meds given. Wound/Skin care completed. Pt repositioned every 2 hours. Dialysis catheter dressing changed. Bed in lowest position with fall bundle in place. Daughter updated on pt's care overnight. Plan of care to discharge today.    Safety and fall precautions remain in place. Purposeful rounding completed.          ASSESSMENT     Changes in assessment from patient's baseline this shift:    Neuro: No  CV: No  Pulm: No  Peripheral Vascular: No  HEENT: No  GI: No  BM during shift: No, Last BM Date: 08/06/22  GU: No   Integ: No  MS: No    Pain: Improved  Pain Interventions: Medications  Medications Utilized: Oxycodone oral     Mobility: PMP Activity: Step 3 - Bed Mobility of Distance Walked (ft) (Step 6,7): 0 Feet           Lines     Patient Lines/Drains/Airways Status       Active Lines, Drains and Airways       Name Placement date Placement time Site Days    Permacath Catheter - Tunneled 07/14/22 Subclavian vein catheter Right 07/14/22  1404  -- 26    Midline IV 07/01/22 Anterior;Left Upper Arm 07/01/22  1315  Upper Arm  39                         VITAL SIGNS     Vitals:    08/10/22 0509   BP: 117/56   Pulse: 64   Resp:    Temp:    SpO2:        Temp  Min: 97.3 F (36.3 C)  Max: 98.1 F (36.7 C)  Pulse  Min: 61  Max: 77  Resp  Min: 16  Max: 20  BP  Min: 117/56  Max: 154/72  SpO2  Min: 93 %  Max: 100 %      Intake/Output Summary (Last 24 hours) at 08/10/2022 0624  Last data filed at 08/10/2022 0509  Gross per 24 hour   Intake 360 ml   Output 0 ml   Net 360 ml          CARE PLAN        Problem: Moderate/High Fall Risk Score >5  Goal: Patient will remain free of falls  Flowsheets (Taken 08/09/2022 2106)  High (Greater than 13):   HIGH-Visual cue at entrance to patient's room   HIGH-Apply yellow  "Fall Risk" arm band   HIGH-Pharmacy to initiate evaluation and intervention per protocol   HIGH-Initiate use of floor mats as appropriate   HIGH-Consider use of low bed   HIGH-Bed alarm on at all times while patient in bed     Problem: Compromised Sensory Perception  Goal: Sensory Perception Interventions  Flowsheets (Taken 08/09/2022 2106)  Sensory Perception Interventions: Offload heels, Pad bony prominences, Reposition q 2hrs/turn Clock, Q2 hour skin assessment under devices if present     Problem: Compromised Moisture  Goal: Moisture level Interventions  Flowsheets (Taken 08/09/2022 2106)  Moisture level Interventions: Moisture wicking products, Moisture barrier cream     Problem: Compromised Activity/Mobility  Goal: Activity/Mobility Interventions  Flowsheets (Taken 08/09/2022  2106)  Activity/Mobility Interventions: Pad bony prominences, TAP Seated positioning system when OOB, Promote PMP, Reposition q 2 hrs / turn clock, Offload heels     Problem: Compromised Friction/Shear  Goal: Friction and Shear Interventions  Flowsheets (Taken 08/09/2022 2106)  Friction and Shear Interventions: Pad bony prominences, Off load heels, HOB 30 degrees or less unless contraindicated, Consider: TAP seated positioning, Heel foams     Problem: Inadequate Airway Clearance  Goal: Patent Airway maintained  Flowsheets (Taken 08/05/2022 1255 by Wess Botts, RN)  Patent airway maintained:   Position patient for maximum ventilatory efficiency   Provide adequate fluid intake to liquefy secretions   Suction secretions as needed   Reinforce use of ordered respiratory interventions (i.e. CPAP, BiPAP, Incentive Spirometer, Acapella, etc.)   Reposition patient every 2 hours and as needed unless able to self-reposition     Problem: Infection Prevention  Goal: Free from infection  Flowsheets (Taken 08/05/2022 1255 by Wess Botts, RN)  Free from infection:   Monitor/assess vital signs   Encourage/assist patient to turn, cough and perform deep  breathing every 2 hours   Assess incision for evidence of healing   Monitor/assess output from surgical drain if present   Assess for signs and symptoms of infection   Monitor/assess lab values and report abnormal values     Problem: Constipation  Goal: Fluid and electrolyte balance are achieved/maintained  Flowsheets (Taken 08/08/2022 1803 by Wess Botts, RN)  Fluid and electrolyte balance are achieved/maintained:   Monitor/assess lab values and report abnormal values   Assess and reassess fluid and electrolyte status   Observe for cardiac arrhythmias   Monitor for muscle weakness     Problem: Safety  Goal: Patient will be free from injury during hospitalization  Flowsheets (Taken 08/05/2022 1255 by Wess Botts, RN)  Patient will be free from injury during hospitalization:   Assess patient's risk for falls and implement fall prevention plan of care per policy   Use appropriate transfer methods   Provide and maintain safe environment   Include patient/ family/ care giver in decisions related to safety   Ensure appropriate safety devices are available at the bedside   Hourly rounding   Assess for patients risk for elopement and implement Elopement Risk Plan per policy   Provide alternative method of communication if needed (communication boards, writing)  Goal: Patient will be free from infection during hospitalization  Flowsheets (Taken 08/05/2022 1255 by Wess Botts, RN)  Free from Infection during hospitalization:   Monitor lab/diagnostic results   Monitor all insertion sites (i.e. indwelling lines, tubes, urinary catheters, and drains)   Assess and monitor for signs and symptoms of infection   Encourage patient and family to use good hand hygiene technique     Problem: Pain  Goal: Pain at adequate level as identified by patient  Flowsheets (Taken 08/08/2022 1803 by Wess Botts, RN)  Pain at adequate level as identified by patient:   Identify patient comfort function goal   Assess for risk of opioid  induced respiratory depression, including snoring/sleep apnea. Alert healthcare team of risk factors identified.   Assess pain on admission, during daily assessment and/or before any "as needed" intervention(s)   Reassess pain within 30-60 minutes of any procedure/intervention, per Pain Assessment, Intervention, Reassessment (AIR) Cycle   Evaluate if patient comfort function goal is met   Evaluate patient's satisfaction with pain management progress   Offer non-pharmacological pain management interventions   Consult/collaborate with Pain Service   Consult/collaborate with Physical Therapy,  Occupational Therapy, and/or Speech Therapy     Problem: Side Effects from Pain Analgesia  Goal: Patient will experience minimal side effects of analgesic therapy  Flowsheets (Taken 08/08/2022 1803 by Wess Botts, RN)  Patient will experience minimal side effects of analgesic therapy:   Monitor/assess patient's respiratory status (RR depth, effort, breath sounds)   Prevent/manage side effects per LIP orders (i.e. nausea, vomiting, pruritus, constipation, urinary retention, etc.)   Assess for changes in cognitive function   Evaluate for opioid-induced sedation with appropriate assessment tool (i.e. POSS)     Problem: Hemodynamic Status: Cardiac  Goal: Stable vital signs and fluid balance  Flowsheets (Taken 08/08/2022 1803 by Wess Botts, RN)  Stable vital signs and fluid balance:   Assess signs and symptoms associated with cardiac rhythm changes   Monitor lab values     Problem: Renal Instability  Goal: Fluid and electrolyte balance are achieved/maintained  Flowsheets (Taken 08/08/2022 1803 by Wess Botts, RN)  Fluid and electrolyte balance are achieved/maintained:   Monitor/assess lab values and report abnormal values   Assess and reassess fluid and electrolyte status   Monitor for muscle weakness   Follow fluid restrictions/IV/PO parameters     Problem: Patient Receiving Advanced Renal Therapies  Goal: Therapy access  site remains intact  Flowsheets (Taken 08/08/2022 1803 by Wess Botts, RN)  Therapy access site remains intact:   Assess therapy access site   Change therapy access site dressing as needed     Problem: Fluid and Electrolyte Imbalance/ Endocrine  Goal: Fluid and electrolyte balance are achieved/maintained  Flowsheets (Taken 08/08/2022 1803 by Wess Botts, RN)  Fluid and electrolyte balance are achieved/maintained:   Monitor/assess lab values and report abnormal values   Assess and reassess fluid and electrolyte status   Observe for cardiac arrhythmias   Monitor for muscle weakness  Goal: Adequate hydration  Flowsheets (Taken 08/05/2022 1255 by Wess Botts, RN)  Adequate hydration:   Assess mucus membranes, skin color, turgor, perfusion and presence of edema   Assess for peripheral, sacral, periorbital and abdominal edema   Monitor and assess vital signs and perfusion     Problem: Diabetes: Glucose Imbalance  Goal: Blood glucose stable at established goal  Flowsheets (Taken 08/08/2022 1803 by Wess Botts, RN)  Blood glucose stable at established goal:   Monitor lab values   Monitor intake and output.  Notify LIP if urine output is < 30 mL/hour.   Follow fluid restrictions/IV/PO parameters   Include patient/family in decisions related to nutrition/dietary selections   Assess for hypoglycemia /hyperglycemia   Coordinate medication administration with meals, as indicated   Monitor/assess vital signs   Ensure appropriate diet and assess tolerance   Ensure adequate hydration   Ensure appropriate consults are obtained (Nutrition, Diabetes Education, and Case Management)   Ensure patient/family has adequate teaching materials     Problem: Inadequate Gas Exchange  Goal: Patent Airway maintained  Flowsheets (Taken 08/05/2022 1255 by Wess Botts, RN)  Patent airway maintained:   Position patient for maximum ventilatory efficiency   Provide adequate fluid intake to liquefy secretions   Suction secretions as  needed   Reinforce use of ordered respiratory interventions (i.e. CPAP, BiPAP, Incentive Spirometer, Acapella, etc.)   Reposition patient every 2 hours and as needed unless able to self-reposition  Goal: Adequate oxygenation and improved ventilation  Flowsheets (Taken 08/09/2022 2130 by Wess Botts, RN)  Adequate oxygenation and improved ventilation:   Assess lung sounds   Monitor SpO2 and treat as needed  Monitor and treat ETCO2   Provide mechanical and oxygen support to facilitate gas exchange   Position for maximum ventilatory efficiency   Teach/reinforce use of incentive spirometer 10 times per hour while awake, cough and deep breath as needed   Increase activity as tolerated/progressive mobility   Plan activities to conserve energy: plan rest periods     Problem: Pain interferes with ability to perform ADL  Goal: Pain at adequate level as identified by patient  Flowsheets (Taken 08/08/2022 1803 by Wess Botts, RN)  Pain at adequate level as identified by patient:   Identify patient comfort function goal   Assess for risk of opioid induced respiratory depression, including snoring/sleep apnea. Alert healthcare team of risk factors identified.   Assess pain on admission, during daily assessment and/or before any "as needed" intervention(s)   Reassess pain within 30-60 minutes of any procedure/intervention, per Pain Assessment, Intervention, Reassessment (AIR) Cycle   Evaluate if patient comfort function goal is met   Evaluate patient's satisfaction with pain management progress   Offer non-pharmacological pain management interventions   Consult/collaborate with Pain Service   Consult/collaborate with Physical Therapy, Occupational Therapy, and/or Speech Therapy

## 2022-08-10 NOTE — Progress Notes (Signed)
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 s/p bilateral radical nephrectomies and  cystectomy on 06/28/22  - complicated by small bowel enterotomy and blood loss   New onset afib - on amiodarone and anticoagulation  High grade urothelial bladder CA: s/p bilateral nephrectomies and cystectomy c/b mall bowel enterotomy and blood loss (~1L) on 06/28/22  - awaiting wound debridement   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   - on aranesp  Encephalopathy - persistent   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   HTN - BP controlled    Recommendations:   -s/p Hd today with no UF due to soft BP  -Private pay at [facility] nursing facility -  we will follow her there  -Continue Aranesp      Case discussed with: patient    Delice Bison, MD  [facility]  [phone] (office)  [phone] (FFX Spectra-Link)      Subjective: awake, not responding to questions     Review of Systems:   Unobtainable due to encephalopathy    Physical Exam:     Vitals:    08/10/22 1200 08/10/22 1215 08/10/22 1230 08/10/22 1239   BP: (!) 85/51 105/55 (!) 88/51 129/53   Pulse: 87 85 81 81   Resp:    15   Temp:    98.7 F (37.1 C)   TempSrc:       SpO2:       Weight:       Height:           Intake and Output Summary (Last 24 hours) at Date Time    Intake/Output Summary (Last 24 hours) at 08/10/2022 1513  Last data filed at 08/10/2022 1239  Gross per 24 hour   Intake 120 ml   Output 400 ml   Net -280 ml         General: awake  Cardiovascular: regular rate and  rhythm  Lungs: CTA B anteriorly without rhonchi or wheezing  Abdomen: soft, midline wound with clear dressing and VAC in place  Extremities: no edema, heel protectors in place  Access: R permacath     Meds:      Scheduled Meds: PRN Meds:    acetaminophen, 650 mg, Oral, 4 times per day   Or  acetaminophen, 650 mg, Oral, 4 times per day  amiodarone, 200 mg, per NG tube, Daily  apixaban, 2.5 mg, Oral, Q12H SCH  atorvastatin, 40 mg, Oral, QHS  b complex-vitamin c-folic acid, 1 tablet, Oral, Daily  carvedilol, 12.5 mg, Oral, Q12H SCH  darbepoetin alfa, 0.45 mcg/kg, Subcutaneous, Weekly  glycopyrrolate, 0.2 mg, Intravenous, Q8H  lidocaine, 20 mL, Intradermal, Once  pantoprazole, 40 mg, per NG tube, Q12H SCH  senna-docusate, 1 tablet, Oral, QHS  thiamine, 100 mg, per NG tube, Daily  umeclidinium-vilanterol, 1 puff, Inhalation, QAM          Continuous Infusions:         sodium  chloride, , PRN  acetaminophen, 650 mg, Q6H PRN  albuterol-ipratropium, 3 mL, Q6H PRN  benzocaine, 1 spray, TID PRN  calcium carbonate, 500 mg, Q4H PRN  dextrose, 15 g of glucose, PRN   Or  dextrose, 12.5 g, PRN   Or  dextrose, 12.5 g, PRN   Or  glucagon (rDNA), 1 mg, PRN  diphenhydrAMINE, 25 mg, Q6H PRN  hydrALAZINE, 10 mg, Q3H PRN  HYDROmorphone, 1 mg, Q4H PRN  labetalol, 10 mg, Q15 Min PRN  lidocaine 2% jelly, , Once PRN  naloxone, 0.2 mg, PRN  ondansetron, 4 mg, Q6H PRN  oxyCODONE, 2.5 mg, Q4H PRN  phenol, 1 spray, Q2H PRN  polyethylene glycol, 17 g, Daily PRN              Labs:     Recent Labs   Lab 08/10/22  0430 08/08/22  0420 08/06/22  0504   WBC 12.93* 12.97* 13.91*   Hemoglobin 8.3* 8.7* 8.9*   Hematocrit 26.7* 27.9* 28.9*   Platelet Count 304 237 244     Recent Labs   Lab 08/10/22  0430 08/08/22  0420 08/06/22  0504 08/04/22  0404   Sodium 141 142 141 141   Potassium 4.9 3.8 4.3 4.2   Chloride 100 103 103 103   CO2 25 27 27 25    BUN 50* 23* 27* 25*   Creatinine 6.2* 3.9* 4.1* 3.8*   Calcium 8.8 8.8 8.4 8.4   Albumin 1.5* 1.7* 1.6* 1.6*    Phosphorus  --   --   --  3.6   Magnesium  --   --   --  1.8   Glucose 83 115* 96 94   GFR 6.2* 10.8* 10.2* 11.1*           Signed by: Delice Bison, MD

## 2022-08-10 NOTE — Plan of Care (Signed)
Day: 33      SHIFT EVENTS     Shift Narrative/Significant Events (PRN med administration, fall, RRT, etc.):   Patient returned from dialysis, due medications administered, pt tolerated well. Repositioned q2hrs. Wound Vac in place, cherry colored drainage noted. Pt is total dependent and assisted with ADL's. [name] updated on plan of care.  Safety and fall precautions remain in place. Purposeful rounding completed.        Problem: Moderate/High Fall Risk Score >5  Goal: Patient will remain free of falls  Flowsheets (Taken 08/10/2022 1425)  High (Greater than 13):   HIGH-Bed alarm on at all times while patient in bed   HIGH-Utilize chair pad alarm for patient while in the chair   HIGH-Apply yellow "Fall Risk" arm band   HIGH-Initiate use of floor mats as appropriate   HIGH-Consider use of low bed     Problem: Compromised Sensory Perception  Goal: Sensory Perception Interventions  Flowsheets (Taken 08/10/2022 1100)  Sensory Perception Interventions: Offload heels, Pad bony prominences, Reposition q 2hrs/turn Clock, Q2 hour skin assessment under devices if present     Problem: Compromised Activity/Mobility  Goal: Activity/Mobility Interventions  Flowsheets (Taken 08/10/2022 1100)  Activity/Mobility Interventions: Pad bony prominences, TAP Seated positioning system when OOB, Promote PMP, Reposition q 2 hrs / turn clock, Offload heels     Problem: Compromised Friction/Shear  Goal: Friction and Shear Interventions  Flowsheets (Taken 08/10/2022 1100)  Friction and Shear Interventions: Pad bony prominences, Off load heels, HOB 30 degrees or less unless contraindicated, Consider: TAP seated positioning, Heel foams     Problem: Infection Prevention  Goal: Free from infection  Flowsheets (Taken 08/10/2022 1425)  Free from infection:   Monitor/assess vital signs   Assess incision for evidence of healing   Assess for signs and symptoms of infection     Problem: Impaired Mobility  Goal: Mobility/Activity is maintained at  optimal level for patient  Flowsheets (Taken 08/10/2022 1425)  Mobility/activity is maintained at optimal level for patient:   Encourage independent activity per ability   Consult/collaborate with Physical Therapy and/or Occupational Therapy     Problem: Constipation  Goal: Fluid and electrolyte balance are achieved/maintained  Flowsheets (Taken 08/10/2022 1425)  Fluid and electrolyte balance are achieved/maintained:   Monitor/assess lab values and report abnormal values   Assess and reassess fluid and electrolyte status     Problem: Safety  Goal: Patient will be free from injury during hospitalization  Flowsheets (Taken 08/10/2022 1425)  Patient will be free from injury during hospitalization:   Assess patient's risk for falls and implement fall prevention plan of care per policy   Provide and maintain safe environment   Ensure appropriate safety devices are available at the bedside   Hourly rounding   Include patient/ family/ care giver in decisions related to safety  Goal: Patient will be free from infection during hospitalization  Flowsheets (Taken 08/10/2022 1425)  Free from Infection during hospitalization:   Assess and monitor for signs and symptoms of infection   Monitor all insertion sites (i.e. indwelling lines, tubes, urinary catheters, and drains)   Encourage patient and family to use good hand hygiene technique     Problem: Pain  Goal: Pain at adequate level as identified by patient  Flowsheets (Taken 08/10/2022 1425)  Pain at adequate level as identified by patient:   Identify patient comfort function goal   Assess for risk of opioid induced respiratory depression, including snoring/sleep apnea. Alert healthcare team of risk factors identified.  Reassess pain within 30-60 minutes of any procedure/intervention, per Pain Assessment, Intervention, Reassessment (AIR) Cycle   Evaluate if patient comfort function goal is met   Evaluate patient's satisfaction with pain management progress   Offer  non-pharmacological pain management interventions     Problem: Hemodynamic Status: Cardiac  Goal: Stable vital signs and fluid balance  Flowsheets (Taken 08/10/2022 1425)  Stable vital signs and fluid balance:   Assess signs and symptoms associated with cardiac rhythm changes   Monitor lab values     Problem: Renal Instability  Goal: Fluid and electrolyte balance are achieved/maintained  Flowsheets (Taken 08/10/2022 1425)  Fluid and electrolyte balance are achieved/maintained:   Monitor/assess lab values and report abnormal values   Monitor for muscle weakness   Observe for cardiac arrhythmias     Problem: Fluid and Electrolyte Imbalance/ Endocrine  Goal: Fluid and electrolyte balance are achieved/maintained  Flowsheets (Taken 08/10/2022 1425)  Fluid and electrolyte balance are achieved/maintained:   Monitor/assess lab values and report abnormal values   Assess and reassess fluid and electrolyte status     Problem: Every Day - Stroke  Goal: Mobility/Activity is maintained at optimal level for patient  Flowsheets (Taken 08/10/2022 1425)  Mobility/activity is maintained at optimal level for patient:   Encourage independent activity per ability   Consult/collaborate with Physical Therapy and/or Occupational Therapy     Problem: Pain interferes with ability to perform ADL  Goal: Pain at adequate level as identified by patient  Flowsheets (Taken 08/10/2022 1425)  Pain at adequate level as identified by patient:   Identify patient comfort function goal   Assess for risk of opioid induced respiratory depression, including snoring/sleep apnea. Alert healthcare team of risk factors identified.   Reassess pain within 30-60 minutes of any procedure/intervention, per Pain Assessment, Intervention, Reassessment (AIR) Cycle   Evaluate if patient comfort function goal is met   Evaluate patient's satisfaction with pain management progress   Offer non-pharmacological pain management interventions     Problem: Everyday - Heart  Failure  Goal: Mobility/Activity is Maintained at Optimal Level for Patient  Flowsheets (Taken 08/10/2022 1425)  Mobility/Activity is Maintained at Optimal Level for Patient:   Perform active/passive ROM   Reposition patient every 2 hours and as needed unless able to reposition self     NURSING SHIFT NOTE         ASSESSMENT     Changes in assessment from patient's baseline this shift:    Neuro:   CV:   Pulm:   Peripheral Vascular:   HEENT:   GI:   BM during shift: , Last BM: Last BM Date: 08/06/22  GU:    Integ:   MS:     Pain:   Pain Interventions:   Medications Utilized:     Mobility: PMP Activity: Step 3 - Bed Mobility of Distance Walked (ft) (Step 6,7): 0 Feet           Lines     Patient Lines/Drains/Airways Status       Active Lines, Drains and Airways       Name Placement date Placement time Site Days    Permacath Catheter - Tunneled 07/14/22 Subclavian vein catheter Right 07/14/22  1404  -- 27    Midline IV 07/01/22 Anterior;Left Upper Arm 07/01/22  1315  Upper Arm  40                         VITAL SIGNS

## 2022-08-10 NOTE — Progress Notes (Signed)
Hd tx started via R CVC, pt is awake & responsive when name called, vs wdl, Report received from [facility] RN.   08/10/22 0845   Bedside Nurse Communication   Name of bedside RN - pre dialysis Kathrynn Ducking RN   Treatment Initiation- With Dialysis Precautions   Time Out/Safety Check Completed Yes   Consent for HD signed for this hospitalization (Date) 06/01/22   Consent for HD signed for this hospitalization (Time) 1516   Preferred language No   Complication waiver N   Blood Consent Verified N/A   Dialysis Precautions All Connections Secured   Dialysis Treatment Type Routine   Is patient diabetic? Yes   RO/Hemodialysis Cabin crew   Is Total Chlorine less than 0.1 ppm? Yes   Orignial Total Chlorine Testing Time 0830   At 4 Hour Total Chlorine Testing Time 1230   RO/Hemodialysis Arts development officer Number 7    Machine Serial Number [id number]   RO # 25   RO Serial # [id number]   Water Hardness NA   pH 7.2   Pressure Test Verified Yes   Alarms Verified Passed   Machine Temperature 97.7 F (36.5 C)   Alarms Verified Yes   Na+ mEq (Machine) 138 mEq   Bicarb mEq (Machine) 35 mEq   Hemodialysis Conductivity (Machine) 13.6   Hemodialysis Conductivity (Meter) 13.8   Dialyzer Lot Number [id number]   Tubing Lot Number [id number]   RO Machine Log Completed Yes   Hepatitis Status   HBsAg (Antigen) Result Negative   HBsAg Date Drawn 08/02/22   HBsAg Repeat Draw Due Date 08/30/22   Dialysis Weight   Pre-Treatment Weight (Kg) 86.9   Scale Type ICU Bed Scale   Vitals   Temp 97.3 F (36.3 C)   Heart Rate 79   Resp Rate 17   BP 124/64   O2 Device None (Room air)   Assessment   Mental Status Alert;Does not follow commands   Cardiac (WDL) WDL   Cardiac Symptoms Return to [facility]   Respiratory  WDL   Respiratory Pattern Return to WDL   Bilateral Breath Sounds Clear;Diminished   R Breath Sounds Diminished   L Breath Sounds Diminished   Edema  WDL   Generalized Edema Non Pitting Edema   Facial Return to WDL   RLE Edema Non Pitting  Edema   LLE Edema Non Pitting Edema   General Skin Color Appropriate for ethnicity   Skin Temp Warm   Gastrointestinal (WDL) X   Abdomen Inspection Open abdomen  (wound vac)   Permacath Catheter - Tunneled 07/14/22 Subclavian vein catheter Right   Placement Date/Time: 07/14/22 1404   Inserted by: Dr. Welton Flakes  Access Type: Subclavian vein catheter  Orientation: Right  Central Line Infection Prevention Education provided?: Yes  Hand Hygiene: Alcohol based hand scrub;Soap and water  Line cart u...   Line necessity reviewed? Apheresis/hemodialysis   Site Assessment Clean;Dry;Intact   Catheter Lumen Volume Venous 1.6 mL   Catheter Lumen Volume Arterial 1.6 mL   Dressing Status and Intervention Dressing Intact;Clean & Dry   Tego/Curos Caps on Catheter Yes   NEW Tego/Curos Caps placed (Date) 08/10/22   APHERESIS ONLY:  Access  Red   APHERESIS ONLY: Return Blue   End Caps Free From Blood Yes   Line Care Connections checked and tightened   Dressing Type Transparent;Biopatch   Dressing Status Clean;Dry;Intact   Dressing/Line Intervention Caps changed   Pain Assessment   Charting Type Assessment  Pain Scale Used Numeric Scale (0-10)   Numeric Pain Scale   Pain Score 0   POSS Score 1   Multiple Pain Sites No   Hemodialysis Comments   Pre-Hemodialysis Comments timeout & saefty checksdone

## 2022-08-10 NOTE — Discharge Summary (Addendum)
[facility]      Patient: Shelby Barron  Admission Date: 06/28/2022   DOB: 07-May-1940  Discharge Date: 08/10/2022    MRN: [id number]  Discharge Attending: A , MD     Referring Physician: Patsy Lager, MD  PCP: Patsy Lager, MD       DISCHARGE SUMMARY     Discharge Information   Admission Diagnosis:   Bladder cancer    Discharge Diagnosis:   Active Hospital Problems    Diagnosis    Anemia    COPD (chronic obstructive pulmonary disease)    Bladder cancer    ESRD (end stage renal disease)   Acute toxic metabolic Encephalopathy/delirium stable-improved  Possible underlying cognitive impairment  Anemia of CKD and acute blood loss anemia  S/p open cystectomy, bilateral nephrectomies, and resection of small bowel after intra-op enterotomy    Mixed shock - hemorrhagic vs septic: Resolved  Necrotic wound tissue  Acute CVA   Petechial hemorrhage vs cortical laminar necrosis  Acute hypoxemic respiratory failure resolved  COPD  ESRD  New onset Afib with RVR, improved  Renal cell carcinoma outpatient follow-up hematology oncology  Urothelial carcinoma    Malnutrition Documentation    Moderate Malnutrition related to inadequate nutritional intake in the setting of acute illness as evidenced by <50% EER > 5 days, mild muscle losses (temporalis, deltoid) - new                Discharge Condition: stable   Consultants: surgery, ID, cardiology, nephro, palliative, GI and neurology   Discharged to: SNF peer to peer denied, [facility] pending dispo planning per CM likely home with [facility]   Presentation History   Per admission H&P: Shelby Barron is a 82 y.o. female with history of COPD, [name], type 2 diabetes, renal cell carcinoma status post partial bilateral nephrectomy, [name], new diagnosis of urothelial cancer.       She was recently admitted in May 2024 at which time she was transitioned from PD to HD.  At that time was diagnosed with urothelial cancer.  She presented today for scheduled  cystectomy and completion nephrectomy with urology.  Her last Plavix dose was 6/3, her last dialysis was 6/8.  She had preop clearance with cardiology which included an echo.     In the OR course was complicated by  Small bowel enterotomy -General surgery called in enterotomy repaired with initial plan to put patient back into continuity     Later in the case significant bleeding during nephrectomy/cystectomy.  Approximately 1 L EBL.  Given significant bleeding decision made to leave patient in discontinuity and abdomen open with plans to return to the OR tomorrow.    Hospital Course (43 Days)     Acute toxic metabolic Encephalopathy/delirium stable-improved  Possible underlying cognitive impairment  - On and off confusion alert oriented x 2, now relatively stable the last several days  - Patient was upgraded to ICU on 6/30 for lethargy and decreased responsiveness. CT head was with no acute finding.   - EEG consistent with encephalopathy no seizure, CVA management as below  - SLP advanced diet. NG tube removed 7/9.  - Palliative consulted:  Family was clear they would like no limits to care and patient remained full code.     Anemia of CKD and acute blood loss anemia  - TSAT 18%  - given Aranesp this admission per nephrology  - Hgb now stable. No  signs of active bleeding.     S/p open cystectomy, bilateral nephrectomies, and resection of small bowel after intra-op enterotomy    Mixed shock - hemorrhagic vs septic: Resolved  Necrotic wound tissue  - 6/10 small bowel enteretomy with 1L EBL. Went to the OR on June 12 for small bowel anastomosis, closure of mesenteric defect, closure of abdominal wall with wound VAC application. Wound VAC in place. Will need outpatient wound care follow up for ongoing management.  - Surgery consulted, CT abdomen with and without contrast did not show any anastomotic leak or abscess.   - Status post wound debridement by general surgery at bedside on 7/15 for necrotic wound tissue  - ID  consulted, completed meropenem and Mycamine for 2 weeks on 7/3. No further abx recommended after bedside debridement on 7/15  Wound care team recommendation.    When patient discharged or VAC discontinued -   Remove all foam dressing and non adherent dressing and place hydrogel moistened gauze into the wound bed, cover with ABD pad tape to secure.   Wound Vac machine - discard canister and tubing.    Place wound vac in CLEAR plastic bag and place in the soiled utility room. DO NOT USE RED BIOHAZARD BAGS.    Please contact WOC office [phone] and leave a message for WOC to pick up.      Acute CVA   Petechial hemorrhage vs cortical laminar necrosis  - Stopped ASA and started Eliquis per neurology on 7/5 after >10 days after CVA. Continue statin therapy.  - Carotid Doppler showed plaque with no critical stenosis  - Head CT on 7/7 w/ a few areas of cortical hyperdensity - petechial hemorrhage vs cortical laminar necrosis. Discussed with neuro OK to continue eliquis for now. Repeat head CT on 7/8 stable.  - EEG encephalopathic pattern no seizure, repeat MRI no new infarction  - PT OT recommend SNF on discharge     Acute hypoxemic respiratory failure resolved  COPD  - Extubated on 6/13, currently on room air  - X-ray repeated on 6/29 showed resolution of interstitial edema     ESRD  S/p CRRT this admission, now on HD TTS  Continue dialysis on routine basis as outpatient  IR removed the nonfunctional permacath and right Quinton IJ cath and replaced with a new permacath on 6/26.     New onset Afib with RVR, improved  - CHA2DS2-VASc score is 7.   - Status post amiodarone drip. Now stable on oral amiodarone and coreg per cardiology recommendations  - GI and general surgery cleared for anticoagulation. Started on Eliquis since 07/23/22 with neurology clearance after CVA     Renal cell carcinoma outpatient follow-up hematology oncology  Urothelial carcinoma    Plan discussed with the daughter in detail, question answered concern  addressed.         Progress Note/Physical Exam at Discharge     Subjective:Patient is stable for discharge.    Vitals:    08/10/22 1215 08/10/22 1230 08/10/22 1239 08/10/22 1532   BP: 105/55 (!) 88/51 129/53 112/69   Pulse: 85 81 81 89   Resp:   15 16   Temp:   98.7 F (37.1 C) 99 F (37.2 C)   TempSrc:    Oral   SpO2:    94%   Weight:       Height:           GEN APPEARANCE: Alert, awake and oriented x 2 (oriented to self  and place)  HEENT: PERLA; EOMI; Conjunctiva Clear  NECK: Supple; No bruits  CVS: RRR, S1, S2; No M/G/R  LUNGS: CTAB; , no Rhonchi: No rales  ABD: Soft; No TTP; + Normoactive BS vertical abdominal wound and wound VAC in place  EXT: No edema; Pulses 2+ and intact  NEURO: moving right upper extremity spontaneously, minimal spont mvmt of LEs, no movement of left upper extremity       Diagnostics     Labs/Studies Pending at Discharge: No    Last Labs   Recent Labs   Lab 08/10/22  0430 08/08/22  0420 08/06/22  0504   WBC 12.93* 12.97* 13.91*   RBC 2.88* 3.00* 3.12*   Hemoglobin 8.3* 8.7* 8.9*   Hematocrit 26.7* 27.9* 28.9*   MCV 92.7 93.0 92.6   Platelet Count 304 237 244       Recent Labs   Lab 08/10/22  0430 08/08/22  0420 08/06/22  0504 08/04/22  0404   Sodium 141 142 141 141   Potassium 4.9 3.8 4.3 4.2   Chloride 100 103 103 103   CO2 25 27 27 25    BUN 50* 23* 27* 25*   Creatinine 6.2* 3.9* 4.1* 3.8*   Glucose 83 115* 96 94   Calcium 8.8 8.8 8.4 8.4   Magnesium  --   --   --  1.8       Microbiology Results (last 15 days)       ** No results found for the last 360 hours. **             Patient Instructions   Discharge Diet: renal diet heart healthy   Discharge Activity: as tolerated per PT    Follow Up Appointment:   Follow-up Information       [facility] [place]. Call.    Specialty: Gastroenterology  Why: please call to make f/u in 6 weeks.  Contact information:  [address]  [place] [address]  [phone]  Additional information:  Take [address]  North/South to [place], [place] [address] [place].  Turn right on [address] and then right again at [employer].  Destination will be on your right.               Alla German, MD Follow up.    Specialties: Nephrology, Internal Medicine  Contact information:  [address]  [place] [address]  [phone]               [facility] Follow up in 1 month(s).    Specialty: Cardiology  Contact information:  [address]  [place] [address]  [phone]             Marlaine Hind, MD Follow up in 1 week(s).    Specialty: Surgery  Contact information:  [address]  [address]  [place] [address]  [phone]               [facility] Follow up in 1 week(s).    Contact information:  [address]  [place] [address]                            Discharge Medications:     Medication List        START taking these medications      amiodarone 200 MG  tablet  Commonly known as: PACERONE  Take 1 tablet (200 mg) by mouth daily This is a maintenance dose.     apixaban 2.5 MG  Commonly known as: ELIQUIS  Take 1 tablet (2.5 mg) by mouth every 12 (twelve) hours     b complex-vitamin c-folic acid 0.8 MG Tabs  Take 1 tablet by mouth daily     carvedilol 12.5 MG tablet  Commonly known as: COREG  Take 1 tablet (12.5 mg) by mouth every 12 (twelve) hours     oxyCODONE 5 MG immediate release tablet  Commonly known as: ROXICODONE  Take 0.5 tablets (2.5 mg) by mouth every 8 (eight) hours as needed for Pain  Replaces: oxyCODONE 5 MG capsule     pantoprazole 40 mg/20 mL Susp  Commonly known as: PROTONIX  Take 20 mLs (40 mg) by mouth every 12 (twelve) hours  Replaces: pantoprazole 40 MG tablet            CONTINUE taking these medications      Anoro Ellipta 62.5-25 MCG/ACT Aepb  Generic drug: umeclidinium-vilanterol     atorvastatin 40 MG tablet  Commonly known as: LIPITOR     cinacalcet 30 MG tablet  Commonly known as:  SENSIPAR     conjugated estrogens vaginal cream  Commonly known as: PREMARIN     montelukast 10 MG tablet  Commonly known as: SINGULAIR     ondansetron 4 MG tablet  Commonly known as: Zofran  Take 1 tablet (4 mg) by mouth every 8 (eight) hours as needed for Nausea     oxybutynin XL 5 MG 24 hr tablet  Commonly known as: DITROPAN-XL     sevelamer carbonate 800 MG tablet  Commonly known as: RENVELA            STOP taking these medications      allopurinol 100 MG tablet  Commonly known as: ZYLOPRIM     AMBIEN PO     b complex vitamins capsule     clopidogrel 75 mg tablet  Commonly known as: PLAVIX     hyoscyamine 0.125 MG tablet  Commonly known as: LEVSIN     insulin aspart 100 UNIT/ML injection  Commonly known as: NovoLOG     metoprolol tartrate 25 MG tablet  Commonly known as: LOPRESSOR     oxyCODONE 5 MG capsule  Commonly known as: OXY-IR  Replaced by: oxyCODONE 5 MG immediate release tablet     pantoprazole 40 MG tablet  Commonly known as: PROTONIX  Replaced by: pantoprazole 40 mg/20 mL Susp     pregabalin 50 MG capsule  Commonly known as: LYRICA     torsemide 100 MG tablet  Commonly known as: DEMADEX     Tresiba 100 UNIT/ML Soln  Generic drug: Insulin Degludec               Where to Get Your Medications        You can get these medications from any pharmacy    Bring a paper prescription for each of these medications  oxyCODONE 5 MG immediate release tablet       Information about where to get these medications is not yet available    Ask your nurse or doctor about these medications  amiodarone 200 MG tablet  apixaban 2.5 MG  b complex-vitamin c-folic acid 0.8 MG Tabs  carvedilol 12.5 MG tablet  pantoprazole 40 mg/20 mL Susp          Time spent examining patient, discussing with  patient/family regarding hospital course, chart review, reconciling medications and discharge planning: 45 minutes.    Signed,  Gayleen Orem, MD    4:37 PM 08/10/2022

## 2022-08-11 ENCOUNTER — Inpatient Hospital Stay: Payer: No Typology Code available for payment source

## 2022-08-11 DIAGNOSIS — J449 Chronic obstructive pulmonary disease, unspecified: Secondary | ICD-10-CM

## 2022-08-11 DIAGNOSIS — E46 Unspecified protein-calorie malnutrition: Secondary | ICD-10-CM

## 2022-08-11 DIAGNOSIS — E1165 Type 2 diabetes mellitus with hyperglycemia: Secondary | ICD-10-CM

## 2022-08-11 DIAGNOSIS — R001 Bradycardia, unspecified: Secondary | ICD-10-CM

## 2022-08-11 DIAGNOSIS — G9341 Metabolic encephalopathy: Secondary | ICD-10-CM

## 2022-08-11 DIAGNOSIS — D631 Anemia in chronic kidney disease: Secondary | ICD-10-CM

## 2022-08-11 DIAGNOSIS — R571 Hypovolemic shock: Secondary | ICD-10-CM

## 2022-08-11 DIAGNOSIS — K219 Gastro-esophageal reflux disease without esophagitis: Secondary | ICD-10-CM

## 2022-08-11 DIAGNOSIS — Z905 Acquired absence of kidney: Secondary | ICD-10-CM

## 2022-08-11 DIAGNOSIS — R079 Chest pain, unspecified: Secondary | ICD-10-CM

## 2022-08-11 DIAGNOSIS — E8809 Other disorders of plasma-protein metabolism, not elsewhere classified: Secondary | ICD-10-CM

## 2022-08-11 LAB — RENAL FUNCTION PANEL
Albumin: 1.4 g/dL — ABNORMAL LOW (ref 3.5–5.0)
Anion Gap: 15 (ref 5.0–15.0)
BUN: 23 mg/dL — ABNORMAL HIGH (ref 7–21)
CO2: 23 mEq/L (ref 17–29)
Calcium: 8.3 mg/dL (ref 7.9–10.2)
Chloride: 105 mEq/L (ref 99–111)
Creatinine: 3.7 mg/dL — ABNORMAL HIGH (ref 0.4–1.0)
GFR: 11.5 mL/min/{1.73_m2} — ABNORMAL LOW (ref >=60.0)
Glucose: 110 mg/dL — ABNORMAL HIGH (ref 70–100)
Phosphorus: 3.9 mg/dL (ref 2.3–4.7)
Potassium: 4.2 mEq/L (ref 3.5–5.3)
Sodium: 143 mEq/L (ref 135–145)

## 2022-08-11 LAB — ECG 12-LEAD
Atrial Rate: 67 {beats}/min
Atrial Rate: 68 {beats}/min
IHS MUSE NARRATIVE AND IMPRESSION: NORMAL
IHS MUSE NARRATIVE AND IMPRESSION: NORMAL
P Axis: 15 degrees
P Axis: 58 degrees
P-R Interval: 160 ms
P-R Interval: 172 ms
Q-T Interval: 474 ms
Q-T Interval: 502 ms
QRS Duration: 74 ms
QRS Duration: 80 ms
QTC Calculation (Bezet): 500 ms
QTC Calculation (Bezet): 533 ms
R Axis: -11 degrees
R Axis: 1 degrees
T Axis: 65 degrees
T Axis: 60 degrees
Ventricular Rate: 67 {beats}/min
Ventricular Rate: 68 {beats}/min

## 2022-08-11 LAB — WHOLE BLOOD GLUCOSE POCT
Whole Blood Glucose POCT: 114 mg/dL — ABNORMAL HIGH (ref 70–100)
Whole Blood Glucose POCT: 123 mg/dL — ABNORMAL HIGH (ref 70–100)
Whole Blood Glucose POCT: 117 mg/dL — ABNORMAL HIGH (ref 70–100)

## 2022-08-11 LAB — CBC
Absolute nRBC: 0 10*3/uL (ref <=0.00)
Hematocrit: 24.5 % — ABNORMAL LOW (ref 34.7–43.7)
Hemoglobin: 7.7 g/dL — ABNORMAL LOW (ref 11.4–14.8)
MCH: 29.5 pg (ref 25.1–33.5)
MCHC: 31.4 g/dL — ABNORMAL LOW (ref 31.5–35.8)
MCV: 93.9 fL (ref 78.0–96.0)
MPV: 9.7 fL (ref 8.9–12.5)
Platelet Count: 272 10*3/uL (ref 142–346)
RBC: 2.61 10*6/uL — ABNORMAL LOW (ref 3.90–5.10)
RDW: 15 % (ref 11–15)
WBC: 12.69 10*3/uL — ABNORMAL HIGH (ref 3.10–9.50)
nRBC %: 0 /100 WBC (ref <=0.0)

## 2022-08-11 LAB — LACTIC ACID
Whole Blood Lactic Acid: 2.8 mmol/L — ABNORMAL HIGH (ref 0.2–2.0)
Whole Blood Lactic Acid: 1.5 mmol/L (ref 0.2–2.0)

## 2022-08-11 LAB — MAGNESIUM: Magnesium: 1.9 mg/dL (ref 1.6–2.6)

## 2022-08-11 LAB — AMMONIA: Ammonia: 16 umol/L — ABNORMAL LOW (ref 18–72)

## 2022-08-11 LAB — CALCIUM, IONIZED: Calcium, Ionized: 2.2 mEq/L — ABNORMAL LOW (ref 2.23–2.56)

## 2022-08-11 MED ORDER — NOREPINEPHRINE-DEXTROSE 8-5 MG/250ML-% IV SOLN (IHS)
0.0000 ug/min | INTRAVENOUS | Status: DC
Start: 2022-08-11 — End: 2022-08-11

## 2022-08-11 MED ORDER — PIPERACILLIN-TAZOBACTAM 2.25 GM MBP (CNR)
2.2500 g | Freq: Three times a day (TID) | INTRAVENOUS | Status: DC
Start: 2022-08-11 — End: 2022-08-11
  Administered 2022-08-11 (×3): 2250 mg via INTRAVENOUS
  Filled 2022-08-11 (×4): qty 2250

## 2022-08-11 MED ORDER — VANCOMYCIN PHARMACY TO DOSE PLACEHOLDER
INTRAVENOUS | Status: DC
Start: 2022-08-11 — End: 2022-08-11

## 2022-08-11 MED ORDER — ACETAMINOPHEN 160 MG/5ML PO SUSP/SOLN (WRAP)
650.0000 mg | ORAL | Status: DC | PRN
Start: 2022-08-11 — End: 2022-08-18
  Administered 2022-08-11: 650 mg via ORAL
  Filled 2022-08-11 (×3): qty 20.3

## 2022-08-11 MED ORDER — ACETAMINOPHEN 325 MG PO TABS
650.0000 mg | ORAL_TABLET | ORAL | Status: DC | PRN
Start: 2022-08-11 — End: 2022-08-18
  Administered 2022-08-13: 650 mg via ORAL
  Filled 2022-08-11: qty 2

## 2022-08-11 MED ORDER — CALCIUM GLUCONATE-NACL 1-0.675 GM/50ML-% IV SOLN
1.0000 g | Freq: Once | INTRAVENOUS | Status: AC
Start: 2022-08-11 — End: 2022-08-11
  Administered 2022-08-11: 1 g via INTRAVENOUS
  Filled 2022-08-11: qty 50

## 2022-08-11 MED ORDER — VANCOMYCIN HCL 1 G IV SOLR
1750.0000 mg | Freq: Once | INTRAVENOUS | Status: AC
Start: 2022-08-11 — End: 2022-08-11
  Administered 2022-08-11: 1750 mg via INTRAVENOUS
  Filled 2022-08-11: qty 1750

## 2022-08-11 MED ORDER — TRAMADOL HCL 50 MG PO TABS
50.0000 mg | ORAL_TABLET | Freq: Three times a day (TID) | ORAL | Status: DC | PRN
Start: 2022-08-11 — End: 2022-08-18
  Administered 2022-08-11 – 2022-08-18 (×11): 50 mg via ORAL
  Filled 2022-08-11 (×11): qty 1

## 2022-08-11 MED ORDER — ACETAMINOPHEN 650 MG RE SUPP
650.0000 mg | RECTAL | Status: DC | PRN
Start: 2022-08-11 — End: 2022-08-18

## 2022-08-11 NOTE — SLP Eval Note (Signed)
[facility]  Speech Therapy Treatment Note    Patient:  Shelby Barron MRN#:  [id number]  Unit:  [place] [facility]ARDIOV[facility]ULAR [facility] NEURO INTENSIVE CARE Room/Bed:  V4098/J1914-78      Time of treatment:   SLP Received On: 08/11/22  Start Time: 1000  Stop Time: 1020  Time Calculation (min): 20 min       Personal Protective Equipment (PPE)  Gloves and Procedure Mask with Shield    Subjective:   New evaluation due to patient transferring to CVICU         Objective: Oropharyngeal analysis during PO trials in order to determine LRD,development and training of swallow strategies in order to improve swallow function and decrease risk of penetration/aspiration;  therapeutic feedings by SLP  and safe swallowing precautions education.       Assessment: Patient presents similar to previous session with suspected mild oropharyngeal dysphagia. Patient demonstrates slow mastication, bolus formation and A/P transit. Decreased bolus cohesion with lingual residuals with dry solids, which eventually cleared with multiple liquid washes and moist puree bites. Swallow initiation appeared timely vs mildly delayed with adequate laryngeal elevation on palpation. Retrograde back flow of thin liquid via straw noted / improved with mildly thickened single sips.         Plan/Recommendations:  IDDSI Pureed - PU4  and Mildly thick  MT2  Slow feeding rate, small bites / sips , upright position during and after the meals , Alternate solid and liquid , Reflux precautions , full assistance with meals , and check oral cavity for residue         Charges Minutes   SLP treat    Swallow treat 20       08/11/2022  Presley Raddle MS [facility] SLP   08/11/2022 2:47 PM  [phone]

## 2022-08-11 NOTE — Progress Notes (Signed)
Patient seen and examined at bedside.  Patient currently alert oriented x 2 knows location and then, exam with able to move right upper extremity with some diminished movement of the right lower extremity and plus on the left side, as per the review of medical documentation discussion with physicians previously given to the patient.  This appears to be patient's baseline mental status as patient has waxing and waning mental status, patient likely had a vasovagal episode in the setting of being moved and being hypoperfused which caused a change in neurological status and now patient is closer to baseline mental status.  MRI is ordered and pending, neurology has been consulted as follow-up there was suspect which further input in that standpoint.  Patient was restarted antibiotics with vancomycin and Zosyn, [place] vancomycin low suspicion for infection, if cultures are negative with de-escalate antibiotics, patient has low albumin if patient cannot tolerate oral initiation or fail speech eval can consider Dobbhoff and rediscuss with surgery if PEG tube is an option given poor nutritional status and need for adequate nutrition for wound healing purposes.  [place] Coreg for now given labile blood pressures episode, transfer to IMCU, discussed with hospitalist

## 2022-08-11 NOTE — Progress Notes (Signed)
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 s/p bilateral radical nephrectomies and  cystectomy on 06/28/22  - complicated by small bowel enterotomy and blood loss   New onset afib - on amiodarone and anticoagulation  High grade urothelial bladder CA: s/p bilateral nephrectomies and cystectomy c/b mall bowel enterotomy and blood loss (~1L) on 06/28/22  - awaiting wound debridement   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   - on aranesp  Encephalopathy - persistent - episode of unresponsive last night   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   HTN - BP controlled    Recommendations:     HD tomorrow  Plan for discharge to [facility] facility - we will continue to follow her there     Case discussed with: ICU, HD RN    Delice Bison, MD  [facility]  [phone] (office)  X [phone] ([facility] Spectra-Link)      Subjective: overnight events noted  Transferred to NICU overnight for episode of unresponsiveness while being transferred from bed to stretcher.     Review of Systems:   Asleep     Physical Exam:     Vitals:    08/11/22 0500 08/11/22 0600 08/11/22 0700 08/11/22 0800   BP: 122/57 100/49 116/56 108/53   Pulse: 69 (!) 59 81 68   Resp: (!) 32 18 (!) 40 (!) 33   Temp:    99.2 F (37.3 C)   TempSrc:    Axillary   SpO2: 97% 97% 95% 99%   Weight:       Height:           Intake and Output Summary (Last 24 hours) at Date Time    Intake/Output Summary (Last 24 hours) at 08/11/2022 0901  Last data filed at 08/11/2022 0359  Gross  per 24 hour   Intake 320 ml   Output 400 ml   Net -80 ml         General: asleep  Cardiovascular: regular rate and rhythm  Lungs: CTA B anteriorly without rhonchi or wheezing  Abdomen: soft, midline wound with clear dressing and VAC in place  Extremities: no edema, heel protectors in place  Access: R permacath     Meds:      Scheduled Meds: PRN Meds:    acetaminophen, 650 mg, Oral, 4 times per day   Or  acetaminophen, 650 mg, Oral, 4 times per day  amiodarone, 200 mg, per NG tube, Daily  apixaban, 2.5 mg, Oral, Q12H SCH  atorvastatin, 40 mg, Oral, QHS  b complex-vitamin c-folic acid, 1 tablet, Oral, Daily  darbepoetin alfa, 0.45 mcg/kg, Subcutaneous, Weekly  glycopyrrolate, 0.2 mg, Intravenous, Q8H  lidocaine, 20 mL, Intradermal, Once  pantoprazole, 40 mg, per NG tube, Q12H SCH  piperacillin-tazobactam, 2.25 g, Intravenous, Q8H  senna-docusate, 1 tablet, Oral, QHS  thiamine, 100 mg, per NG tube, Daily  umeclidinium-vilanterol, 1 puff, Inhalation, QAM  vancomycin, , Intravenous, See Admin Instructions  Continuous Infusions:         sodium chloride, , PRN  acetaminophen, 650 mg, Q4H PRN   Or  acetaminophen, 650 mg, Q4H PRN   Or  acetaminophen, 650 mg, Q4H PRN  albuterol-ipratropium, 3 mL, Q6H PRN  benzocaine, 1 spray, TID PRN  calcium carbonate, 500 mg, Q4H PRN  dextrose, 15 g of glucose, PRN   Or  dextrose, 12.5 g, PRN   Or  dextrose, 12.5 g, PRN   Or  glucagon (rDNA), 1 mg, PRN  diphenhydrAMINE, 25 mg, Q6H PRN  hydrALAZINE, 10 mg, Q3H PRN  labetalol, 10 mg, Q15 Min PRN  lidocaine 2% jelly, , Once PRN  naloxone, 0.4 mg, PRN  ondansetron, 4 mg, Q6H PRN  phenol, 1 spray, Q2H PRN  polyethylene glycol, 17 g, Daily PRN              Labs:     Recent Labs   Lab 08/11/22  0211 08/10/22  2236 08/10/22  0430   WBC 12.69* 11.15* 12.93*   Hemoglobin 7.7* 8.0* 8.3*   Hematocrit 24.5* 25.2* 26.7*   Platelet Count 272 292 304     Recent Labs   Lab 08/11/22  0212 08/10/22  2236 08/10/22  0430   Sodium 143 143 141   Potassium  4.2 4.1 4.9   Chloride 105 105 100   CO2 23 24 25    BUN 23* 22* 50*   Creatinine 3.7* 3.6* 6.2*   Calcium 8.3 8.1 8.8   Albumin 1.4* 1.5* 1.5*   Phosphorus 3.9  --   --    Magnesium 1.9  --   --    Glucose 110* 126* 83   GFR 11.5* 11.9* 6.2*           Signed by: Delice Bison, MD

## 2022-08-11 NOTE — Treatment Plan (Cosign Needed)
4 eyes in 4 hours pressure injury assessment note:      Completed with:   Unit & Time admitted:              Bony Prominences: Check appropriate box; if wound is present enter wound assessment in LDA     Occiput:                 [x] WNL  []  Wound present  Face:                     [x] WNL  []  Wound present  Ears:                      [x] WNL  []  Wound present  Spine:                    [x] WNL  []  Wound present  Shoulders:             [x] WNL  []  Wound present  Elbows:                  [x] WNL  []  Wound present  Sacrum/coccyx:     [] WNL  [x]  Wound present  Ischial Tuberosity:  [] WNL  [x]  Wound present  Trochanter/Hip:      [x] WNL  []  Wound present  Knees:                   [x] WNL  []  Wound present  Ankles:                   [x] WNL  []  Wound present  Heels:                    [x] WNL  []  Wound present  Other pressure areas:  []  Wound location       Device related: []  Device name:         LDA completed if wound present: yes  Consult WOCN if necessary yes    Other skin related issues, ie tears, rash, etc, document in Integumentary flowsheet

## 2022-08-11 NOTE — Consults (Signed)
Palliative re-consult noted. Patient and family well known to this Palliative Care team. Please see prior PC notes dated 6/19 and 7/1. Family has been dismissive of PC team and did not want to speak further with [place] at the prior two meetings. Patient clearly warrants repeat goals discussions, yet family must be agreeable to speak with PC first. Discussed with ICU attending this morning, will await ICU confirmation whether family OK to speak with our team for goals before we see for re-consult. ICU team aware, our team remains available to re-engage goals discussions any time.     Hermelinda Dellen, MD  Palliative Care  [facility]

## 2022-08-11 NOTE — Progress Notes (Signed)
LOS # 44      Summary of Discharge Plan: [facility], private pay      Identified Possible Discharge Barriers: Discharge held yesterday due to RRT. Pending medical stability.      CM Interventions and Outcome: CM spoke with Cassi with Admissions at [facility] [phone]. She is updated of events overnight and patient is not ready for discharge. CM notes a potential EDD for tomorrow. Her bed is still available and family has paid for her room. Transport will need to be set up through [employer].      Discussed above Discharge Plan with (patient, family, Care Team, others): care team,daughter      Case Management will continue to follow for [place] needs that may arise.      Marlan Palau, MSW, ACM-SW  Social Worker Case Manager II  [facility]

## 2022-08-11 NOTE — Plan of Care (Signed)
Problem: Moderate/High Fall Risk Score >5  Goal: Patient will remain free of falls  Outcome: Progressing  Flowsheets (Taken 08/11/2022 0500)  High (Greater than 13):   HIGH-Apply yellow "Fall Risk" arm band   HIGH-Initiate use of floor mats as appropriate   HIGH-Consider use of low bed     Problem: Compromised Friction/Shear  Goal: Friction and Shear Interventions  Outcome: Progressing  Flowsheets (Taken 08/11/2022 0500)  Friction and Shear Interventions: Pad bony prominences, Off load heels, HOB 30 degrees or less unless contraindicated, Consider: TAP seated positioning, Heel foams     Problem: Safety  Goal: Patient will be free from injury during hospitalization  Outcome: Progressing  Flowsheets (Taken 08/11/2022 0500)  Patient will be free from injury during hospitalization:   Assess patient's risk for falls and implement fall prevention plan of care per policy   Provide and maintain safe environment   Use appropriate transfer methods   Ensure appropriate safety devices are available at the bedside   Include patient/ family/ care giver in decisions related to safety   Hourly rounding   Assess for patients risk for elopement and implement Elopement Risk Plan per policy   Provide alternative method of communication if needed (communication boards, writing)  Goal: Patient will be free from infection during hospitalization  Outcome: Progressing  Flowsheets (Taken 08/11/2022 0500)  Free from Infection during hospitalization:   Assess and monitor for signs and symptoms of infection   Monitor lab/diagnostic results   Monitor all insertion sites (i.e. indwelling lines, tubes, urinary catheters, and drains)   Encourage patient and family to use good hand hygiene technique     Problem: Renal Instability  Goal: Fluid and electrolyte balance are achieved/maintained  Outcome: Progressing  Flowsheets (Taken 08/11/2022 0500)  Fluid and electrolyte balance are achieved/maintained:   Monitor/assess lab values and report abnormal  values   Assess and reassess fluid and electrolyte status   Observe for cardiac arrhythmias   Monitor for muscle weakness   Follow fluid restrictions/IV/PO parameters     Problem: Neurological Deficit  Goal: Neurological status is stable or improving  Outcome: Progressing  Flowsheets (Taken 08/11/2022 0500)  Neurological status is stable or improving:   Monitor/assess/document neurological assessment (Stroke: every 4 hours)   Monitor/assess NIH Stroke Scale   Re-assess NIH Stroke Scale for any change in status   Observe for seizure activity and initiate seizure precautions if indicated   Perform CAM Assessment     Problem: Every Day - Stroke  Goal: Neurological status is stable or improving  Outcome: Progressing  Flowsheets (Taken 08/11/2022 0500)  Neurological status is stable or improving:   Monitor/assess/document neurological assessment (Stroke: every 4 hours)   Monitor/assess NIH Stroke Scale   Re-assess NIH Stroke Scale for any change in status   Observe for seizure activity and initiate seizure precautions if indicated   Perform CAM Assessment     Problem: Everyday - Heart Failure  Goal: Mobility/Activity is Maintained at Optimal Level for Patient  Outcome: Progressing  Flowsheets (Taken 08/11/2022 0500)  Mobility/Activity is Maintained at Optimal Level for Patient:   Increase mobility as tolerated/progressive mobility protocol   Maintain SCD's as Ordered   Perform active/passive ROM   Reposition patient every 2 hours and as needed unless able to reposition self   Assess for changes in respiratory status, level of consciousness and/or development of fatigue   Consult with Physical Therapy and/or Occupational Therapy

## 2022-08-11 NOTE — Progress Notes (Signed)
[facility]  PROGRESS NOTE      Patient: Shelby Barron  Date: 08/11/2022   LOS: 44 Days  Admission Date: 06/28/2022   MRN: [id number]  Attending: Donzetta Sprung, MD  When on service as the attending, please contact me on Epic Secure Chat from 7 AM - 7 PM for non-urgent issues. For urgent matters use XTend page from 7 AM - 7 PM.       ASSESSMENT/PLAN     Shelby Barron is a 82 y.o. female admitted with Bladder cancer    Interval Summary:     Assessment and plan:    New onset hypotension  New onset encephalopathy  -Considering to be more vasovagal  -Discontinue antibiotics, discussed with infectious disease  -Some thought process for C. difficile, will obtain CT abdomen without contrast to see if there is any colonic findings after speaking with ID  -Blood pressure adequate at this time, improvement in altered mental status as listed in the subjective portion  -Continue neurochecks every 4 hours    Acute toxic metabolic Encephalopathy/delirium stable-improved  Possible underlying cognitive impairment  - On and off confusion alert oriented x 2, now relatively stable the last several days  - Patient was upgraded to ICU on 6/30 for lethargy and decreased responsiveness. CT head was with no acute finding.   - EEG consistent with encephalopathy no seizure, CVA management as below  - SLP advanced diet. NG tube removed 7/9.  - Palliative consulted:  Family was clear they would like no limits to care and patient remained full code.     Anemia of CKD and acute blood loss anemia  - TSAT 18%  - given Aranesp this admission per nephrology  - Hgb now stable. No signs of active bleeding.     S/p open cystectomy, bilateral nephrectomies, and resection of small bowel after intra-op enterotomy    Mixed shock - hemorrhagic vs septic: Resolved  Necrotic wound tissue  - 6/10 small bowel enteretomy with 1L EBL. Went to the OR on June 12 for small bowel anastomosis, closure of mesenteric defect, closure of abdominal wall with wound  VAC application. Wound VAC in place. Will need outpatient wound care follow up for ongoing management.  - Surgery consulted, CT abdomen with and without contrast did not show any anastomotic leak or abscess.   - Status post wound debridement by general surgery at bedside on 7/15 for necrotic wound tissue  - ID consulted, completed meropenem and Mycamine for 2 weeks on 7/3. No further abx recommended after bedside debridement on 7/15  Wound care team recommendation.    When patient discharged or VAC discontinued -   Remove all foam dressing and non adherent dressing and place hydrogel moistened gauze into the wound bed, cover with ABD pad tape to secure.   Wound Vac machine - discard canister and tubing.    Place wound vac in CLEAR plastic bag and place in the soiled utility room. DO NOT USE RED BIOHAZARD BAGS.    Please contact WOC office [phone] and leave a message for WOC to pick up.        Acute CVA   Petechial hemorrhage vs cortical laminar necrosis  - Stopped ASA and started Eliquis per neurology on 7/5 after >10 days after CVA. Continue statin therapy.  - Carotid Doppler showed plaque with no critical stenosis  - Head CT on 7/7 w/ a few areas of cortical hyperdensity - petechial hemorrhage vs cortical laminar necrosis. Discussed with neuro  OK to continue eliquis for now. Repeat head CT on 7/8 stable.  - EEG encephalopathic pattern no seizure, repeat MRI no new infarction  - PT OT recommend SNF on discharge     Acute hypoxemic respiratory failure resolved  COPD  - Extubated on 6/13, currently on room air  - X-ray repeated on 6/29 showed resolution of interstitial edema     ESRD  S/p CRRT this admission, now on HD TTS  Continue dialysis on routine basis as outpatient  IR removed the nonfunctional permacath and right Quinton IJ cath and replaced with a new permacath on 6/26.     New onset Afib with RVR, improved  - CHA2DS2-VASc score is 7.   - Status post amiodarone drip. Now stable on oral amiodarone and coreg per  cardiology recommendations  - GI and general surgery cleared for anticoagulation. Started on Eliquis since 07/23/22 with neurology clearance after CVA     Renal cell carcinoma outpatient follow-up hematology oncology  Urothelial carcinoma                     Nutrition: Pured, mildly thick liquids  Malnutrition Documentation    Moderate Malnutrition related to inadequate nutritional intake in the setting of acute illness as evidenced by <50% EER > 5 days, mild muscle losses (temporalis, deltoid) - new                 Patient has BMI=Body mass index is 32.14 kg/m.  Diagnosis: Obesity based on BMI criteria         Recent Labs   Lab 08/11/22  0211 08/10/22  2236 08/10/22  0430   Hemoglobin 7.7* 8.0* 8.3*   Hematocrit 24.5* 25.2* 26.7*   MCV 93.9 92.3 92.7   WBC 12.69* 11.15* 12.93*   Platelet Count 272 292 304       Recent Labs   Lab 07/18/22  0421 06/02/22  0514   Ferritin 2,300.21*  --    Iron 18* 62   TIBC 99* 145*   Iron Saturation 18 43         Code Status: Full Code    Dispo: Likely tomorrow to SNF    DVT Prophylaxis:   Current Facility-Administered Medications (Includes Only Anticoagulants, Misc. Hematological)   Medication Dose Route Last Admin    apixaban (ELIQUIS) tablet 2.5 mg  2.5 mg Oral 2.5 mg at 08/11/22 1300          CHART  REVIEW & DISCUSSION     The following chart items were reviewed as of 1:53 PM on 08/11/22:  [x]  Lab Results [x]  Imaging Results   [x]  Problem List  [x]  Current Orders [x]  Current Medications  []  Allergies  []  Code Status [x]  Previous Notes   []  SDoH    The management and plan of care for this patient was discussed with the following specialty consultants:  []  Cardiology  []  Gastroenterology                 [x]  Infectious Disease  []  Pulmonology []  Neurology                []  Nephrology  []  Neurosurgery []  Orthopedic Surgery  []  Heme/Onc  []  General Surgery []  Psychiatry                                   []  Palliative    SUBJECTIVE  Shelby Barron is an 82 year old female seen  examined at bedside.  Patient's nurse in the room during this encounter as well.  Patient answers questions although very limited.  Upon discussing with the nurse, patient apparently more verbose and active that she has been the last 2 to 3 days per his report.  Had episode of hypotension and AMS highlighted above.    MEDICATIONS     Current Facility-Administered Medications   Medication Dose Route Frequency    acetaminophen  650 mg Oral 4 times per day    Or    acetaminophen  650 mg Oral 4 times per day    amiodarone  200 mg per NG tube Daily    apixaban  2.5 mg Oral Q12H SCH    atorvastatin  40 mg Oral QHS    b complex-vitamin c-folic acid  1 tablet Oral Daily    calcium GLUConate  1 g Intravenous Once    darbepoetin alfa  0.45 mcg/kg Subcutaneous Weekly    lidocaine  20 mL Intradermal Once    pantoprazole  40 mg per NG tube Q12H SCH    piperacillin-tazobactam  2.25 g Intravenous Q8H    senna-docusate  1 tablet Oral QHS    thiamine  100 mg per NG tube Daily    umeclidinium-vilanterol  1 puff Inhalation QAM       PHYSICAL EXAM     Vitals:    08/11/22 1200   BP:    Pulse:    Resp:    Temp: 99.1 F (37.3 C)   SpO2:        Temperature: Temp  Min: 98.4 F (36.9 C)  Max: 99.2 F (37.3 C)  Pulse: Pulse  Min: 51  Max: 89  Respiratory: Resp  Min: 10  Max: 40  Non-Invasive BP: BP  Min: 74/30  Max: 141/63  Pulse Oximetry SpO2  Min: 94 %  Max: 99 %    Intake and Output Summary (Last 24 hours) at Date Time    Intake/Output Summary (Last 24 hours) at 08/11/2022 1353  Last data filed at 08/11/2022 1230  Gross per 24 hour   Intake 200 ml   Output 20 ml   Net 180 ml       GEN APPEARANCE: A&OX1-2, on room air  HEENT: Anicteric  NECK: Supple;   CVS: RRR, S1, S2;   LUNGS: Decreased breath sounds at bases  ABD: Soft; No TTP; + Normoactive BS  EXT: No edema  NEURO: No new focal deficits noted, right lower extremity has decreased movement on command along with left lower extremity.    LABS     Recent Labs   Lab 08/11/22  0211  08/10/22  2236 08/10/22  0430   WBC 12.69* 11.15* 12.93*   RBC 2.61* 2.73* 2.88*   Hemoglobin 7.7* 8.0* 8.3*   Hematocrit 24.5* 25.2* 26.7*   MCV 93.9 92.3 92.7   Platelet Count 272 292 304       Recent Labs   Lab 08/11/22  0212 08/10/22  2236 08/10/22  0430 08/08/22  0420 08/06/22  0504   Sodium 143 143 141 142 141   Potassium 4.2 4.1 4.9 3.8 4.3   Chloride 105 105 100 103 103   CO2 23 24 25 27 27    BUN 23* 22* 50* 23* 27*   Creatinine 3.7* 3.6* 6.2* 3.9* 4.1*   Glucose 110* 126* 83 115* 96   Calcium 8.3 8.1 8.8 8.8 8.4   Magnesium 1.9  --   --   --   --  Recent Labs   Lab 08/11/22  0212 08/10/22  2236 08/10/22  0430 08/08/22  0420   ALT  --  8 10 8    AST (SGOT)  --  11 11 10    Bilirubin, Total  --  0.2 0.3 0.3   Albumin 1.4* 1.5* 1.5* 1.7*   Alkaline Phosphatase  --  85 91 95       Recent Labs   Lab 08/10/22  2236   hs Troponin 7.1             Microbiology Results (last 15 days)       Procedure Component Value Units Date/Time    Culture, Blood, Aerobic And Anaerobic [[id number]] Collected: 08/11/22 0208    Order Status: Resulted Specimen: Blood, Venous Updated: 08/11/22 0239    Culture, Blood, Aerobic And Anaerobic [[id number]] Collected: 08/11/22 0208    Order Status: Resulted Specimen: Blood, Venous Updated: 08/11/22 0239             RADIOLOGY     MRI Brain WO Contrast    Result Date: 08/11/2022  Motion degraded exam. No acute intracranial abnormality. No new interval infarct. Leota Jacobsen, MD 08/11/2022 12:51 PM    XR Chest AP Portable    Result Date: 08/11/2022  Mild left basilar airspace opacities and small bilateral pleural effusions. Judd Gaudier, MD 08/11/2022 2:48 AM    CT Head WO Contrast    Result Date: 08/11/2022   Redemonstrated multiple areas of subacute infarction with cortical hyperdensities which favor petechial hemorrhage in the right frontal lobe and bilateral parietal lobes. These are similar to the prior examination on 07/26/2022. No evidence of new or progressive acute infarct. No mass  effect. Vernie Murders, MD 08/11/2022 12:00 AM    XR Chest AP Portable    Result Date: 08/02/2022   Hypoventilation with basilar atelectasis. Mild central vascular congestion. Charlott Rakes, MD 08/02/2022 3:23 PM    CT Head WO Contrast    Result Date: 07/26/2022   1.No significant change in multifocal areas of subacute infarction within bilateral cerebral hemispheres. 2.Small areas of petechial hemorrhage in the left parietal and occipital lobes are unchanged. No new hemorrhage or measurable parenchymal hematoma. 3.Persistent complete opacification of the right mastoid air cells and middle ear cavity. Garnette Gunner, MD 07/26/2022 12:27 PM    CT Head WO Contrast    Result Date: 07/25/2022   1.Redemonstration of multifocal areas of bilateral acute infarctions. A few areas of cortical hyperdensity likely represent petechial hemorrhage. No mass occupying hematoma. No significant mass effect or midline shift. 2.Redemonstrated right mastoid and right middle ear cavity effusion with areas of bony thinning versus dehiscence of the right tegmen tympani and right tegmen mastoideum. Vara Guardian, MD 07/25/2022 2:19 PM    CT Abdomen Pelvis W IV And PO Cont    Result Date: 07/19/2022   1.Slight interval increase in left-sided retroperitoneal fluid collection with the other abdominal fluid collections appearing relatively stable. 2. There is no definite abscess or acute inflammatory process. 3. Incidental note is made of a small paraesophageal type hernia. 4. Sigmoid diverticulosis is noted without CT evidence of diverticulitis. 5. Other findings as noted above. Miguel Dibble, MD 07/19/2022 6:36 AM    MRI Brain WO Contrast    Result Date: 07/18/2022   Interval evolution of multifocal recent infarcts in the bilateral cerebral hemispheres. No new infarct. Milan Waunita Schooner, MD 07/18/2022 5:57 PM    XR Chest AP Portable    Result Date: 07/17/2022  1.Near-complete resolution of interstitial edema. 2.Improving pulmonary vascular congestion. Legrand Pitts,  MD 07/17/2022 2:38 PM    CT Head WO Contrast    Result Date: 07/17/2022   1.No new acute intracranial pathology. 2.There are multiple regions of cytotoxic edema in the right > left frontal and left > right occipital lobes, consistent with known early subacute infarcts. Dannielle Burn 07/17/2022 8:26 AM    US Carotid Duplex Dopp Comp Bilateral    Result Date: 07/15/2022   1. Plaque formation in the right internal carotid artery bulb with elevated velocities. Stenosis is estimated at 50-69%. 2. Plaque in the left internal carotid artery with normal velocities. Any stenosis is less than 50%. Criteria for stenosis and velocity parameters are based on the guidelines of the SRU (Society of Radiologists in Ultrasound). Grant et al: Carotid Artery Stenosis: Grayscale and Doppler Ultrasound Diagnosis-- Society of Radiologists in Ultrasound Consensus Conference Ultrasound Quarterly. Volume 19, Number 4; December, 2003. Recommendations updated October 2021 Verne Carrow is an [place] accredited facility Suszanne Finch, MD 07/15/2022 9:41 AM    Tunneled Cath Removal (Permcath)    Result Date: 07/14/2022  1. Nonfunctioning tunneled dialysis catheter as described above. 2. Successful placement of a new 19 cm Equistream catheter as described above. Dimitrios Papadouris, MD 07/14/2022 4:06 PM    Tunneled Cath Placement (Permcath)    Result Date: 07/14/2022  1. Nonfunctioning tunneled dialysis catheter as described above. 2. Successful placement of a new 19 cm Equistream catheter as described above. Dimitrios Papadouris, MD 07/14/2022 4:06 PM    MRI Brain WO Contrast    Result Date: 07/13/2022   1.Patchy areas of acute/recent infarction involving the cerebral hemispheres bilaterally. The infarcts have a configuration suggesting watershed territory infarcts. 2.Chronic ischemic changes as discussed. Georgann Housekeeper, MD 07/13/2022 10:28 PM    CT Head WO Contrast    Result Date: 07/13/2022   1.New region of loss of gray-white matter differentiation in the right  frontal lobe, concerning for acute infarction. 2.A few additional areas of poor gray-white matter differentiation for example in the left occipital lobe may be artifactual but could be correlated with MRI. 3.No evidence of acute intracranial hemorrhage or significant mass effect. These urgent results were discussed with and acknowledged by Dr. Don Broach on 07/13/2022 3:20 PM. Vara Guardian, MD 07/13/2022 3:21 PM   Echo Results       None          Results for orders placed or performed during the hospital encounter of 06/28/22   MRI Brain WO Contrast    Narrative    HISTORY: Stroke, follow up. watershed infarcts this admission, now with  recurrent AMS, rule out recurrent stroke.      COMPARISON: Brain MRI 07/18/2022    TECHNIQUE: MRI of the brain performed on a 1.5 Tesla scanner without  intravenous contrast. per acute stroke protocol. Axial diffusion,  susceptibility, and FLAIR weighted sequences were obtained.     CONTRAST: None.    FINDINGS:   Intracranial: This exam is somewhat motion degraded which limits  sensitivity, but with diagnostic images still obtained. The below findings  are within this context.    There is no acute hemorrhage or acute infarct. Evolving change from prior  multifocal infarcts in bilateral cerebral hemispheres in the left MCA, ACA,  PCA watershed territories, with associated susceptibility artifact likely  representing old petechial hemorrhage. No new infarcts. T2 shine through on  DWI. Symmetric ventricular and sulci enlargement consistent with volume  loss. There are nonspecific  bilateral hemispheric periventricular, deep,  and subcortical white matter T2 FLAIR hyperintensities consistent with  chronic microangiopathic change. No masses or fluid collections.    Partially empty sella with expanded sella turcica. The cerebellar tonsils  are above the foramen magnum.     Vessels: Usual flow void appearance in the major arteries and veins  Bone: There are no suspicious calvarial or skull base  lesions.  Extracranial: Status post bilateral lens surgery. The visualized paranasal  sinuses are predominantly clear.. Complete opacification of the right  mastoid air cells.      Impression    Motion degraded exam. No acute intracranial abnormality. No new interval  infarct.    Leota Jacobsen, MD  08/11/2022 12:51 PM   CT Head WO Contrast    Narrative    HISTORY: Altered mental status.    COMPARISON: 07/26/2022.    TECHNIQUE: CT of the head performed without intravenous contrast.  Multiplanar reformatted images were created and reviewed. The following  dose reduction techniques were utilized: automated exposure control and/or  adjustment of the mA and/or KV according to patient size, and the use of an  iterative reconstruction technique.    CONTRAST: None.    FINDINGS:  Redemonstrated multiple areas of subacute infarction with cortical  hyperdensities which favor petechial hemorrhage in the right frontal lobe  and bilateral parietal lobes. These are similar to the prior examination on  07/26/2022.    No evidence of new or progressive acute infarct. No mass effect. No  evidence of new or progressive acute intracranial hemorrhage. No  extra-axial collection is seen.    Stable volume loss and hypodensities in the white matter consistent with  chronic ischemic changes.      Impression       Redemonstrated multiple areas of subacute infarction with cortical  hyperdensities which favor petechial hemorrhage in the right frontal lobe  and bilateral parietal lobes. These are similar to the prior examination on  07/26/2022. No evidence of new or progressive acute infarct. No mass  effect.    Vernie Murders, MD  08/11/2022 12:00 AM       Signed,  Donzetta Sprung, MD  1:53 PM 08/11/2022

## 2022-08-11 NOTE — OT Progress Note (Signed)
Occupational Therapy Note    Occupational Therapy Treatment  Shelby Barron        Post Acute Care Therapy Recommendations   Discharge Recommendations:  SNF    If recommended discharge disposition is not available, patient will require the following assistance: HH aide, Total assist, Assist with OOB activity, Assist with I/ADLs, and HH therapy    DME needs IF patient is discharging home: Hospital bed, [facility], [facility]    Therapy discharge recommendations may change with patient status.  Please refer to most recent note for up-to-date recommendations.    Unit: [facility]  Bed: A2703/A2703-01    ___________________________________________________    Time of treatment:  OT Received On: 08/11/22  Start Time: 1315  Stop Time: 1339  Time Calculation (min): 24 min       Chart Review and Collaboration with Care Team: 4 minutes, not included in above time.    OT Visit Number: 8      Precautions and Contraindications:    Precautions  Weight Bearing Status: no restrictions  Fall Risks: High    Personal Protective Equipment (PPE)  gloves and procedure mask    Updated Labs:  Lab Results   Component Value Date/Time    HGB 7.7 (L) 08/11/2022 02:11 AM    HGB 8.6 (L) 06/03/2022 04:23 AM    HCT 24.5 (L) 08/11/2022 02:11 AM    HCT 26.8 (L) 06/03/2022 04:23 AM    K 4.2 08/11/2022 02:12 AM    K 3.6 06/03/2022 04:23 AM    NA 143 08/11/2022 02:12 AM    NA 137 06/03/2022 04:23 AM    INR 1.1 07/14/2022 04:35 AM    INR 1.1 06/01/2022 03:15 AM    TROPI 7.1 08/10/2022 10:36 PM    TROPI 9.6 05/26/2022 06:37 PM    TROPI -2.2 05/26/2022 06:37 PM    TROPI 11.8 05/26/2022 03:26 PM       All imaging reviewed, please see chart for details.    Subjective:    .  "I'm so thirsty."     Pt was being discharged to [facility] on 08/10/22 and while being transferred to the stretcher, pt became unresponsive, hypotensive, and hypoxic therefore d/c was canceled and pt was transferred to ICU- CT head  and MRI brain negative for new stroke  Patient's medical condition is appropriate for Occupational Therapy intervention at this time.  Nursing clears patient for therapy.  Pain Assessment  Pain Assessment: No/denies pain      Objective:  Observation of Patient/Vital Signs:    Stable    Cognition/Neuro Status  Arousal/Alertness: Delayed responses to stimuli  Attention Span: Difficulty attending to directions  Orientation Level: Oriented to person;Oriented to place;Disoriented to time;Disoriented to situation  Memory: Decreased recall of recent events;Decreased short term memory  Following Commands: Does not follow commands  Safety Awareness: moderate verbal instruction  Insights: Not aware of deficits  Problem Solving: dependent  Behavior: inattentive;perseverative  Motor Planning: decreased initiation;decreased processing speed  Coordination: FMC impaired;GMC impaired    Functional Mobility  Rolling: Dependent (of 2 persons)  Supine to Sit Transfers: not tested    Self-care and Home Management  Eating: Dependent  Grooming: Dependent  Bathing: Dependent  UB Dressing: Dependent  LB Dressing: Dependent  Toileting: Dependent  Educated the Patient to role of occupational therapy, plan of care, goals of therapy and HEP, safety with mobility and ADLs, home safety with no indication of understanding.  Additional education required..     Patient left in bed with alarm and all other medical equipment in place and call bell and all personal items/needs within reach.  RN notified of session outcome.  CM team and attending have been previously notified of d/c recommendation; no updates to d/c recommendation since that time.      Assessment:  Pt's functional mobility and ADL capability remains poor- pt today is not following commands, and is easily distracted and perseverates at times.  Will need SNF with possible LTC if pt cannot progress toward goals set.  At this time, pt's poor cognition is  limiting her understanding of the plan of care.          PMP Activity: Step 2 - Supine Exercises  Distance Walked (ft) (Step 6,7): 0 Feet      Plan:  OT Frequency Recommended: 2-3x/wk  Goal Formulation: Patient      Time For Goal Achievement: by time of discharge  ADL Goals  Patient will groom self: Moderate Assist, Not met     Neuro Re-Ed Goals  Pt will sit at edge of bed: Maximal Assist, for 3 minutes, Partly met  Other Goal: Pt will follow one step v commands 50% of the time (not met)  Musculoskeletal Goals  Pt will perform Home Exercise Program: maximal assist, with caregiver/family assist, Goal met  Executive Fucntion Goals  Pt will demonstrate attention to task: with minimal assist, to increase ability to complete ADLs, Not met  Other Goal: Pt will be oriented X 3 75% of the time; not met              Continue plan of care.      Vernona Rieger Leeyah Heather OTR/L   08/11/2022 1:46 PM    [facility]  Patient: Shelby Barron MRN#: [id number]   Unit: [facility]O INTENSIVE CARE Bed: V4098/J1914-78

## 2022-08-11 NOTE — UM Notes (Signed)
PATIENT NAME: Shelby Barron,Shelby Barron   DOB: 1940-06-29     Continued Stay Review:  08/11/22    patient apparently more verbose and active that she has been the last 2 to 3 days per his report. Had episode of hypotension and AMS     BP 141/63   Pulse 72   Temp 99.1 F (37.3 C) (Axillary)   Resp (!) 34   Ht 1.575 Barron (5\' 2" )   Wt 79.7 kg (175 lb 11.3 oz)   SpO2 94%   BMI 32.14 kg/Barron      Procedure Component Value Units Date/Time    Renal Function Panel [[id number]]  (Abnormal) Collected: 08/11/22 0212     BUN 23 mg/dL      Creatinine 3.7 mg/dL      GFR 40.9 WJ/XBJ/4.78 m2     Ammonia [[id number]]  (Abnormal) Collected: 08/11/22 0209    Specimen: Blood, Venous Updated: 08/11/22 0235     Ammonia 16 umol/L     Lactic Acid [[id number]]  (Abnormal) Collected: 08/11/22 0208    Specimen: Blood, Venous Updated: 08/11/22 0227     Whole Blood Lactic Acid 2.8 mmol/L     CBC without Differential [[id number]]  (Abnormal) Collected: 08/11/22 0211    Specimen: Blood, Venous Updated: 08/11/22 0221     WBC 12.69 x10 3/uL      Hemoglobin 7.7 g/dL      Hematocrit 40.1 %     Arterial Blood Gas [[id number]]  (Abnormal) Collected: 08/10/22 1110     Arterial pH 7.499     Arterial pCO2 31.2 mmHg      Arterial pO2 121.0 mmHg      Arterial HCO3 24.0 mEq/L      CXR - Mild left basilar airspace opacities and small bilateral pleural effusions.     Scheduled Meds:  Current Facility-Administered Medications   Medication Dose Route Frequency    acetaminophen  650 mg Oral 4 times per day    Or    acetaminophen  650 mg Oral 4 times per day    amiodarone  200 mg per NG tube Daily    apixaban  2.5 mg Oral Q12H SCH    atorvastatin  40 mg Oral QHS    b complex-vitamin c-folic acid  1 tablet Oral Daily    darbepoetin alfa  0.45 mcg/kg Subcutaneous Weekly    lidocaine  20 mL Intradermal Once    pantoprazole  40 mg per NG tube Q12H SCH    piperacillin-tazobactam  2.25 g Intravenous Q8H    senna-docusate  1 tablet Oral QHS    thiamine  100 mg per NG tube Daily     umeclidinium-vilanterol  1 puff Inhalation QAM       Plan of Care  New onset hypotension  New onset encephalopathy  -Considering to be more vasovagal  -Discontinue antibiotics, discussed with infectious disease  -Some thought process for C. difficile, will obtain CT abdomen without contrast to see if there is any colonic findings after speaking with ID  -Blood pressure adequate at this time, improvement in altered mental status as listed in the subjective portion  -Continue neurochecks every 4 hours     Acute toxic metabolic Encephalopathy/delirium stable-improved  Possible underlying cognitive impairment  - On and off confusion alert oriented x 2, now relatively stable the last several days     S/p open cystectomy, bilateral nephrectomies, and resection of small bowel after intra-op enterotomy    Mixed shock - hemorrhagic vs septic: Resolved  Necrotic wound tissue  - ID consulted, completed meropenem and Mycamine for 2 weeks on 7/3. No further abx recommended after bedside debridement on 7/15    UTILIZATION REVIEW CONTACT: Kennon Rounds RN, BSN  Utilization Review   [facility] [employer]  [phone]  [id number]  Email: [email or web address]  NPI:   [id number]  Tax ID:  [id number]         NOTES TO REVIEWER:    This clinical review is based on/compiled from documentation provided by the treatment team within the patient's medical record.

## 2022-08-11 NOTE — Progress Notes (Signed)
Initial Pharmacy Vancomycin Dosing Consult:  Day 1     Patient Parameters  Age: 82 y.o.  Wt Readings from Last 1 Encounters:   08/11/22 79.7 kg (175 lb 11.3 oz)       Serum creatinine: 3.7 mg/dL (H) 40/10/27 2536  Estimated creatinine clearance: 11.3 mL/min (A)    Pertinent Cultures:   Date Source  Organism & Pertinent Susceptibilities     08-11-22 blood                   As appropriate, contact physician to consider change in therapy if cultures grow organism other than MRSA     Assessment     Vancomycin therapy is for empiric treatment of Sepsis (source unknown)  Vancomycin Monitoring Goal:  15-25mg /dL predialysis level for hemodialysis patients  Renal function assessment - Vancomycin: not applicable as patient requires hemodialysis for ESRD  Will dose vancomycin with Single dose then redose vancomycin after hemodialysis based on level assessment        Plan:   Give a one time vancomycin dose of 1750mg  (7-24 0256)  Vancomycin Pre-HD level has been ordered for 7-25  Plan for supplemental doses of vancomycin based on pre-HD levels  Pharmacist will assess daily for additional vancomycin doses       Thank you,  Verl Blalock, RPh  Phone: [phone]

## 2022-08-11 NOTE — H&P (Signed)
[facility] ICU  History and Physical      Patient Name: Shelby Barron  MRN: [id number]  Room: A2703/A2703-01  Code Status: Full Code    Chief Complaint / Primary Reason for MSICU Evaluation   Altered mental status    History of Presenting Illness   Shelby Barron is a 82 y.o. female with multiple medical problems COPD, prior stroke in 2004, type 2 diabetes, RCC status post bilateral nephrectomy, [name], recently diagnosed urothelial cancer, with prolonged hospitalization beginning 6/10 when she was admitted for elective cystectomy and completion nephrectomy with the OR course complicated by small bowel enterotomy intraoperative hemorrhage 1 L EBL status post treatment for septic and hemorrhagic shock in the ICU, requiring CRRT at that time, antibiotics guided by ID.  She returned to the OR 6/12 for small bowel anastomosis, abdominal closure and wound VAC, extubated 6/13.  She was downgraded 6/16 and had a right chest permacath placed by IR on 6/26.  On 6/29 she became lethargic and was again upgraded to ICU on 6/30.  Repeat MRI of the brain was unchanged and EEG was negative.  She was downgraded again and has since been on the hospital medicine service.  Course has been notable for new onset A-fib with RVR status post amiodarone infusion, and Eliquis started 07/23/2022.  She has been awaiting discharge.    This evening, rapid response was called for hypotension and altered mental status with SBP in the 70s.  FSBG was 138.  Patient did receive oxycodone 2.5 mg p.o. earlier this evening and was given Narcan with no significant response.  ABG was unimpressive with no hypercapnia.  She was given a 250 mL IV fluid bolus with normalization of BP.  Hospitalist requested MCCS evaluation and on our encounter, she was noted to be awake but lethargic, GCS E3 V2 M5 (11 total).  Taken for stat head CT without contrast which showed no new infarct, hemorrhage, or other acute intracranial pathology.  Given lethargy  and hypotension, patient was transferred to [facility] ICU for further workup and care.    Subjective   Past Medical History:     Past Medical History:   Diagnosis Date    Acid reflux     Asthma, well controlled     Cancer of kidney     Chronic lower back pain     Congestive heart disease     COPD (chronic obstructive pulmonary disease)     CVA (cerebral infarction) 01/12/2003    Diabetes     Diabetic nephropathy     Fibroids     Gout     H/O: gout     Hyperlipidemia     Hypertension     Neuropathy of hand     SOB (shortness of breath)        Past Surgical History:     Past Surgical History:   Procedure Laterality Date    APPENDECTOMY (OPEN)      CLOSURE, ENTEROTOMY, SMALL INTESTINE  06/28/2022    Procedure: CLOSURE, ENTEROTOMY, SMALL INTESTINE;  Surgeon: Marlaine Hind, MD;  [facility]: [facility];  Service: General;;    CYSTECTOMY, ILEOCONDUIT N/A 06/28/2022    Procedure: CYSTECTOMY, RADICAL;  Surgeon: Neldon Newport, MD;  [facility]: [facility];  Service: Urology;  Laterality: N/A;    DRAIN (OTHER) N/A 05/31/2022    Procedure: DRAIN (OTHER);  Surgeon: Hope Pigeon, MD;  Location: [facility];  Service: Interventional Radiology;  Laterality: N/A;  EXPLORATORY LAPAROTOMY N/A 06/30/2022    Procedure: SMALL BOWEL ANASTOMOSIS, CLOSURE OF MESENTERIC DEFECT, CLOSURE OF ABDOMINAL WALL;  Surgeon: Marlaine Hind, MD;  Location: [facility];  Service: General;  Laterality: N/A;  **IP-2910.01/ MD AVAIL [id number] OR AFTER 1600**    EXPLORATORY LAPAROTOMY, RESECTION SMALL BOWEL N/A 06/28/2022    Procedure: RESECTION SMALL BOWEL;  Surgeon: Marlaine Hind, MD;  [facility]: [facility];  Service: General;  Laterality: N/A;    HERNIA REPAIR      HYSTERECTOMY      LAPAROTOMY, NEPHRECTOMY Bilateral 06/28/2022    Procedure: BILATERAL OPEN RADICAL NEPHRECTOMY AND LYSIS OF ASHESION;  Surgeon: Neldon Newport, MD;  [facility]: [facility];  Service: Urology;  Laterality: Bilateral;    PARTIAL NEPHRECTOMY      tumor removed from kidney   cJanuary 2015    TUNNELED CATH PLACEMENT (PERMCATH) N/A 06/01/2022    Procedure: TUNNELED CATH PLACEMENT;  Surgeon: Suszanne Finch, MD;  [facility]: [facility];  Service: Interventional Radiology;  Laterality: N/A;    TUNNELED CATH PLACEMENT (PERMCATH) Right 07/14/2022    Procedure: TUNNELED CATH PLACEMENT;  Surgeon: Hope Pigeon, MD;  [facility];  Service: Interventional Radiology;  Laterality: Right;  right IJ tunneled HD catheter placement    TUNNELED CATH REMOVAL (PERMCATH) Right 07/14/2022    Procedure: TUNNELED CATH REMOVAL;  Surgeon: Hope Pigeon, MD;  [facility]: [facility];  Service: Interventional Radiology;  Laterality: Right;  right neck/chest tunneled HD catheter removal    TURBT      WOUND VAC APPLICATION N/A 06/28/2022    Procedure: Tawni Pummel WOUND VAC APPLICATION;  Surgeon: Marlaine Hind, MD;  [facility];  Service: General;  Laterality: N/A;    WOUND VAC APPLICATION N/A 06/30/2022    Procedure: WOUND VAC APPLICATION;  Surgeon: Marlaine Hind, MD;  [facility];  Service: General;  Laterality: N/A;       Family History:     Family History   Problem Relation Age of Onset    Cancer Mother     Heart failure Father     Cancer Maternal Aunt     Diabetes Maternal Aunt     Cancer Paternal Aunt        Social History:     Social History     Socioeconomic History    Marital status: Married     Spouse name: Not on file    Number of children: Not on file    Years of education: Not on file    Highest education level: Not on file   Occupational History    Not on file   Tobacco Use    Smoking status: Former    Smokeless tobacco: Never   Vaping Use    Vaping status: Never Used   Substance and Sexual Activity    Alcohol use: No    Drug use: No    Sexual activity: Not on file   Other Topics Concern    Not on file   Social History Narrative    Not on file     Social Determinants of Health     Financial Resource Strain: Not on file   Food Insecurity: No Food Insecurity (05/26/2022)     Hunger Vital Sign     Worried About Running Out of Food in the Last Year: Never true     Ran Out of Food in the Last Year: Never true   Transportation Needs: No Transportation Needs (05/26/2022)  PRAPARE - Therapist, art (Medical): No     Lack of Transportation (Non-Medical): No   Physical Activity: Not on file   Stress: Not on file   Social Connections: Unknown (02/01/2022)    Received from [facility]    Social Network     Social Network: Not on file   Intimate Partner Violence: Not At Risk (05/26/2022)    Humiliation, Afraid, Rape, and Kick questionnaire     Fear of Current or Ex-Partner: No     Emotionally Abused: No     Physically Abused: No     Sexually Abused: No   Housing Stability: Unknown (05/26/2022)    Housing Stability Vital Sign     Unable to Pay for Housing in the Last Year: No     Number of Places Lived in the Last Year: Not on file     Unstable Housing in the Last Year: No       Allergies:   Allergies[1]       Objective   Physical Examination   Vitals Temp:  [97.3 F (36.3 C)-99 F (37.2 C)]   Heart Rate:  [64-89]   Resp Rate:  [14-17]   BP: (73-155)/(30-80)   SpO2:  [93 %-99 %]   Weight:  [85.9 kg (189 lb 6 oz)]   BMI (calculated):  [34.6]  // Temperature with 24 range  Vent Settings      General: Chronically ill-appearing African-American woman, obtunded    Neuro:    Mental status: GCS E3 V2 M5 (11 total).  Localizes with the right upper extremity, does not follow commands.  Pupils equal round and reactive to light.  Does not visually track.  No obvious facial droop.  Moving all extremities except the left upper extremity.    Lungs:   clear to auscultation, no wheezes, rales or rhonchi, symmetric air entry, no tachypnea, retractions or cyanosis     Cardiac:   {:315510}     Abdomen:    {:315920}     Extremities:   {CICUExtremities:63181}    Skin:     {CICUSkin:63192}    Assessment   Shelby Barron is a 82 y.o. female who presents with ***.      {Vanishing  Tip  Patient meets BMI criteria for a weight-based diagnosis. Patient has high (BMI=Body mass index is 34.64 kg/m.)      Overweight = BMI 25-29.9 kg/m^2     Obesity = BMI 30-39.9 kg/m^2     Obesity Class 3 (formerly known as Morbid Obesity) = BMI 40+ kg/m^2  reference. :16109604}  Patient has BMI=Body mass index is 34.64 kg/m.  Diagnosis: {BMI High:57938} based on BMI criteria       {Vanishing Tip Patient has a Hgb level below the normal lab reference range at [facility].  [place] Lab Reference Values    Female Hgb <11.3 is abnormal   Female      Hgb < 12.5 is abnormal. :54098119}  Recent Labs   Lab 08/10/22  2236 08/10/22  0430 08/08/22  0420   Hemoglobin 8.0* 8.3* 8.7*   Hematocrit 25.2* 26.7* 27.9*   MCV 92.3 92.7 93.0   WBC 11.15* 12.93* 12.97*   Platelet Count 292 304 237     Recent Labs   Lab 07/18/22  0421 06/02/22  0514   Ferritin 2,300.21*  --    Iron 18* 62   TIBC 99* 145*   Iron Saturation 18 43     {  Anemia Diagnosis:30464406}   Malnutrition Documentation    Moderate Malnutrition related to inadequate nutritional intake in the setting of acute illness as evidenced by <50% EER > 5 days, mild muscle losses (temporalis, deltoid) - new               Neuro   {Addt Neuro Dx:59059:::0}    Cardiovascular   {Cardiac Addt Dx:59060:::0}    Pulmonary  {Pulm Addt Dx:59061:::0}    Renal  {Renal Addt Dx:59063:::0}    Infections Disease  {Infectious Disease Addt Dx:59065:::0}    Hematology  {Heme Addt Dx:59066:::0}    Endo/Rheum  {Endo Addt Dx:59067:::0}    ICU Checklist  Sedation:   CAM-ICU:     CAM ICU:   Last Documented RASS:     Currently ordered infusions:    Reviewed: {YES/NO:21936::"Yes"}   Mobility:   Current Mobility Level:   PMP Activity: Step 3 - Bed Mobility (08/10/2022  1:00 PM)    Current PT Order: Yes  Current OT Order: Yes Reviewed: {YES/NO:21936::"Yes"}   Respiratory (n/a if blank):   Ventilator Time:    Last Recorded Vent Mode:    Reviewed: {YES/NO:21936::"Yes"}   Gastrointenstinal  Last Bowel Movement:    Last BM Date: 08/06/22 Reviewed: {YES/NO:21936::"Yes"}   CAUTI Prevention (n/a if blank):  Foley Day:        Reviewed: {YES/NO:21936::"Yes"}   Blood Steam Infection Prevention (n/a if blank):   Permacath Catheter - Tunneled 07/14/22 Subclavian vein catheter Right Reviewed: {YES/NO:21936::"Yes"}   DVT Chemoprophylaxis (none if blank):   apixaban (ELIQUIS) tablet 2.5 mg  Reviewed: {YES/NO:21936::"Yes"}            Plan     NEUROLOGICAL:   {cicuNeuro:63182}    CARDIOVASCULAR:   Blood Pressure Goal: ***  ***    PULMONARY:   Supplemental oxygen as needed to keep sat > ***%  ***    RENAL:   ***    GASTROINTESTINAL:  At risk for constipation  Nutrition: ***  Bowel Regimen  GI ppx:     INFECTIOUS DISEASE:   ***  Microbiology History ***    HEMATOLOGY/ONCOLOGY:   ***  - VTE ppx:    ENDOCRONOLOGY/RHEUMATOLOGY:   ***    FAMILY UPDATE:   Patient's *** was updated, they were appreciative and all questions were answered.     Advance Care Planning: Open ACP Navigator  Code Status: Full Code  Next of Kin:  Primary Emergency Contact: Palermo,Nicole  {Vanishing Tip This patient does not have an Advance Care Planning Note. If you have completed a goals of care discussion or advanced care planning meeting please use the ACPNOTE SmartBlock to document. :[id number]}         ADVANCE CARE PLAN             {  Vanishing tip  If showing above, you can right-click on "ADVANCE CARE PLAN" and click on 'edit smartblock' (or click on ACPNOTE box on top) to document an advance care planning discussion if it was completed. If not, this text will disappear. ACP time should be added to your critical care time:65284}    This patient is critically ill with life-threatening condition(s) and a high probability of sudden clinically significant deterioration due to the condition(s) noted in the assessment and plan, which requires the highest level of physician/advance practice provider preparedness to intervene urgently. Full attention to the direct care  of this patient was provided for the period of time noted below. Any critical care time performed today  is exclusive of teaching and billable procedures and not overlapping with any other physicians or advance practice providers.    I have personally assessed the patient and based my assessment and medical decision-making on a review of the patient's history and 24-hour interval events along with medical records, physical examination, vital signs, analysis of recent laboratory results, evaluation of radiology images, monitoring data for potential decompensation, and additional findings found in detail within ICU team notes. The findings and plan of care was discussed with the care team.    Total critical care time: *** minutes during this encounter.    Janora Norlander, MD   08/11/2022 12:11 AM          [1]   Allergies  Allergen Reactions    Metrizamide Shortness Of Breath and Nausea Only    Rosuvastatin Other (See Comments)     myopathy    myopathy   myopathy    myopathy myopathy      myopathy    myopathy, myopathy    Iodinated Contrast Media Other (See Comments)     Nausea and SOB per patient   On Dialysisi    Nausea and SOB per patient    Nausea and SOB per patient      Nausea and SOB per patient   Nausea and SOB per patient    Nausea and SOB per patient      Nausea and SOB per patient Nausea and SOB per patient      Nausea and SOB per patient Nausea and SOB per patient Nausea and SOB per patient      Nausea and SOB per patient  Nausea and SOB per patient Nausea and SOB per patient    Nausea and SOB per patient   Nausea and SOB per patient    Pioglitazone      Weight gain    Sulfa Antibiotics Hives     blister

## 2022-08-11 NOTE — Plan of Care (Signed)
Problem: Moderate/High Fall Risk Score >5  Goal: Patient will remain free of falls  Outcome: Progressing  Flowsheets (Taken 08/11/2022 2200)  High (Greater than 13):   HIGH-Consider use of low bed   HIGH-Initiate use of floor mats as appropriate   HIGH-Pharmacy to initiate evaluation and intervention per protocol   HIGH-Apply yellow "Fall Risk" arm band   HIGH-Utilize chair pad alarm for patient while in the chair   HIGH-Visual cue at entrance to patient's room     Problem: Compromised Sensory Perception  Goal: Sensory Perception Interventions  Outcome: Progressing  Flowsheets (Taken 08/11/2022 2000)  Sensory Perception Interventions: Offload heels, Pad bony prominences, Reposition q 2hrs/turn Clock, Q2 hour skin assessment under devices if present     Problem: Compromised Moisture  Goal: Moisture level Interventions  Outcome: Progressing  Flowsheets (Taken 08/11/2022 2000)  Moisture level Interventions: Moisture wicking products, Moisture barrier cream     Problem: Compromised Activity/Mobility  Goal: Activity/Mobility Interventions  Outcome: Progressing  Flowsheets (Taken 08/11/2022 2000)  Activity/Mobility Interventions: Pad bony prominences, TAP Seated positioning system when OOB, Promote PMP, Reposition q 2 hrs / turn clock, Offload heels     Problem: Compromised Nutrition  Goal: Nutrition Interventions  Outcome: Progressing  Flowsheets (Taken 08/11/2022 2000)  Nutrition Interventions: Discuss nutrition at Rounds, I&Os, Document % meal eaten, Daily weights     Problem: Compromised Friction/Shear  Goal: Friction and Shear Interventions  Outcome: Progressing  Flowsheets (Taken 08/11/2022 2000)  Friction and Shear Interventions: Pad bony prominences, Off load heels, HOB 30 degrees or less unless contraindicated, Consider: TAP seated positioning, Heel foams     Problem: Infection Prevention  Goal: Free from infection  Outcome: Progressing  Flowsheets (Taken 08/11/2022 2303)  Free from infection:   Encourage/assist  patient to turn, cough and perform deep breathing every 2 hours   Monitor/assess vital signs   Assess for signs and symptoms of infection   Monitor/assess lab values and report abnormal values   Assess incision for evidence of healing   Monitor/assess output from surgical drain if present     Problem: Impaired Mobility  Goal: Mobility/Activity is maintained at optimal level for patient  Outcome: Progressing  Flowsheets (Taken 08/11/2022 2303)  Mobility/activity is maintained at optimal level for patient: Encourage independent activity per ability     Problem: Safety  Goal: Patient will be free from injury during hospitalization  Outcome: Progressing  Flowsheets (Taken 08/11/2022 0500 by Mar Daring, RN)  Patient will be free from injury during hospitalization:   Assess patient's risk for falls and implement fall prevention plan of care per policy   Provide and maintain safe environment   Use appropriate transfer methods   Ensure appropriate safety devices are available at the bedside   Include patient/ family/ care giver in decisions related to safety   Hourly rounding   Assess for patients risk for elopement and implement Elopement Risk Plan per policy   Provide alternative method of communication if needed (communication boards, writing)  Goal: Patient will be free from infection during hospitalization  Outcome: Progressing  Flowsheets (Taken 08/11/2022 0500 by Mar Daring, RN)  Free from Infection during hospitalization:   Assess and monitor for signs and symptoms of infection   Monitor lab/diagnostic results   Monitor all insertion sites (i.e. indwelling lines, tubes, urinary catheters, and drains)   Encourage patient and family to use good hand hygiene technique     Problem: Renal Instability  Goal: Fluid and electrolyte balance are achieved/maintained  Outcome: Progressing  Flowsheets (Taken 08/11/2022 1024 by Julien Girt, RN)  Fluid and electrolyte balance are achieved/maintained:   Monitor for muscle  weakness   Monitor/assess lab values and report abnormal values   Assess and reassess fluid and electrolyte status   Observe for cardiac arrhythmias     Problem: Patient Receiving Advanced Renal Therapies  Goal: Therapy access site remains intact  Outcome: Progressing  Flowsheets (Taken 08/08/2022 1803 by Wess Botts, RN)  Therapy access site remains intact:   Assess therapy access site   Change therapy access site dressing as needed     Problem: Neurological Deficit  Goal: Neurological status is stable or improving  Outcome: Progressing  Flowsheets (Taken 08/11/2022 1024 by Julien Girt, RN)  Neurological status is stable or improving:   Monitor/assess/document neurological assessment (Stroke: every 4 hours)   Monitor/assess NIH Stroke Scale   Observe for seizure activity and initiate seizure precautions if indicated   Re-assess NIH Stroke Scale for any change in status   Perform CAM Assessment     Problem: Potential for Aspiration  Goal: Risk of aspiration will be minimized  Outcome: Progressing  Flowsheets (Taken 08/09/2022 2130 by Wess Botts, RN)  Risk of aspiration will be minimized:   Place swallow precaution signage above bed and supervise patient during oral intake   Assess/monitor ability to swallow using dysphagia screen: Keep patient NPO if patient fails screening   Monitor/assess for signs of aspiration (tachypnea, cough, wheezing, clearing throat, hoarseness after eating, decrease in SaO2)   Consult/collaborate/follow recommended modified texture diet/thicken liquids as indicated by Speech Pathologist   Place patient up in chair to eat, if possible/head of bed up 90 degrees to eat if unable to be out of bed   Instruct patient to take small bites, small single sips of liquid, and do not use a straw     Problem: Every Day - Stroke  Goal: Mobility/Activity is maintained at optimal level for patient  Outcome: Progressing  Flowsheets (Taken 08/11/2022 2303)  Mobility/activity is maintained at optimal  level for patient: Encourage independent activity per ability  Goal: Neurological status is stable or improving  Outcome: Progressing  Flowsheets (Taken 08/11/2022 1024 by Julien Girt, RN)  Neurological status is stable or improving:   Monitor/assess/document neurological assessment (Stroke: every 4 hours)   Monitor/assess NIH Stroke Scale   Observe for seizure activity and initiate seizure precautions if indicated   Re-assess NIH Stroke Scale for any change in status   Perform CAM Assessment  Goal: Core/Quality measure requirements - Daily  Outcome: Progressing     Problem: Everyday - Heart Failure  Goal: Mobility/Activity is Maintained at Optimal Level for Patient  Outcome: Progressing  Flowsheets (Taken 08/11/2022 0500 by Mar Daring, RN)  Mobility/Activity is Maintained at Optimal Level for Patient:   Increase mobility as tolerated/progressive mobility protocol   Maintain SCD's as Ordered   Perform active/passive ROM   Reposition patient every 2 hours and as needed unless able to reposition self   Assess for changes in respiratory status, level of consciousness and/or development of fatigue   Consult with Physical Therapy and/or Occupational Therapy  Goal: Nutritional Intake is Adequate  Outcome: Progressing  Flowsheets (Taken 08/05/2022 1255 by Wess Botts, RN)  Nutritional Intake is Adequate:   Fluid Restricction if needed   Consult/Collaborate with Nutritionist   Patient and family teaching on low sodium diet   Cardiac diet-2 gm Sodium   Assess appetite,anorexia and amount of meal/food tolerated   Encourage/perform oral  hygiene as appropriate

## 2022-08-11 NOTE — Plan of Care (Addendum)
1100 - transport on way for pt to go to MRI. Reviewed wound vac with WOCN. Ok to detach the machine and clamp tube while pt is in MRI.   1530 - CT available for CTab pel. Neuro exam done while waiting for transport.   1555 - transport arrived, unchanged neuro status before and after CT. Remained with pt during CT for wound vac care.  Reassessed when returned to room. Family in room most of afternoon to end of shift.    4 eyes in 4 hours pressure injury assessment note:      Completed with: Shelby Barron   Unit & Time admitted:  0715            Bony Prominences: Check appropriate box; if wound is present enter wound assessment in LDA     Occiput:                 [x] WNL  []  Wound present  Face:                     [x] WNL  []  Wound present  Ears:                      [x] WNL  []  Wound present  Spine:                    [x] WNL  []  Wound present  Shoulders:             [x] WNL  []  Wound present  Elbows:                  [x] WNL  []  Wound present   Sacrum/coccyx:     [] WNL  [x]  Wound present   Ischial Tuberosity:  [] WNL  [x]  Wound present  Trochanter/Hip:      [x] WNL  []  Wound present  Knees:                   [x] WNL  []  Wound present  Ankles:                   [x] WNL  []  Wound present  Heels:                    [x] WNL  []  Wound present  Other pressure areas:  []  Wound location       Device related: []  Device name:         LDA completed if wound present: yes/no  Consult WOCN if necessary    Other skin related issues, ie tears, rash, etc, document in Integumentary flowsheet      Problem: Inadequate Airway Clearance  Goal: Patent Airway maintained  Outcome: Progressing  Flowsheets (Taken 08/11/2022 1024)  Patent airway maintained:   Position patient for maximum ventilatory efficiency   Reposition patient every 2 hours and as needed unless able to self-reposition     Problem: Impaired Mobility  Goal: Mobility/Activity is maintained at optimal level for patient  Outcome: Not Progressing  Flowsheets (Taken 08/11/2022  1024)  Mobility/activity is maintained at optimal level for patient: Encourage independent activity per ability     Problem: Constipation  Goal: Elimination patterns are normal or improving  Outcome: Progressing  Note: Pt had 2 BMs during overnight     Problem: Hemodynamic Status: Cardiac  Goal: Stable vital signs and fluid balance  Outcome: Progressing  Flowsheets (Taken 08/11/2022 1024)  Stable vital signs and fluid balance:   Assess signs and symptoms  associated with cardiac rhythm changes   Monitor lab values     Problem: Renal Instability  Goal: Fluid and electrolyte balance are achieved/maintained  Outcome: Progressing  Flowsheets (Taken 08/11/2022 1024)  Fluid and electrolyte balance are achieved/maintained:   Monitor for muscle weakness   Monitor/assess lab values and report abnormal values   Assess and reassess fluid and electrolyte status   Observe for cardiac arrhythmias     Problem: Diabetes: Glucose Imbalance  Goal: Blood glucose stable at established goal  Outcome: Progressing  Flowsheets (Taken 08/11/2022 1024)  Blood glucose stable at established goal:   Monitor lab values   Monitor intake and output.  Notify LIP if urine output is < 30 mL/hour.   Monitor/assess vital signs   Assess for hypoglycemia /hyperglycemia     Problem: Inadequate Gas Exchange  Goal: Patent Airway maintained  Outcome: Progressing  Flowsheets (Taken 08/11/2022 1024)  Patent airway maintained:   Position patient for maximum ventilatory efficiency   Reposition patient every 2 hours and as needed unless able to self-reposition     Problem: Neurological Deficit  Goal: Neurological status is stable or improving  Outcome: Progressing  Flowsheets (Taken 08/11/2022 1024)  Neurological status is stable or improving:   Monitor/assess/document neurological assessment (Stroke: every 4 hours)   Monitor/assess NIH Stroke Scale   Observe for seizure activity and initiate seizure precautions if indicated   Re-assess NIH Stroke Scale for any change  in status   Perform CAM Assessment     Problem: Every Day - Stroke  Goal: Mobility/Activity is maintained at optimal level for patient  Outcome: Not Progressing  Flowsheets (Taken 08/11/2022 1024)  Mobility/activity is maintained at optimal level for patient: Encourage independent activity per ability  Goal: Elimination patterns are normal or improving  Outcome: Progressing  Note: Pt had 2 BMs during overnight  Goal: Neurological status is stable or improving  Outcome: Progressing  Flowsheets (Taken 08/11/2022 1024)  Neurological status is stable or improving:   Monitor/assess/document neurological assessment (Stroke: every 4 hours)   Monitor/assess NIH Stroke Scale   Observe for seizure activity and initiate seizure precautions if indicated   Re-assess NIH Stroke Scale for any change in status   Perform CAM Assessment  Goal: Core/Quality measure requirements - Daily  Outcome: Progressing  Flowsheets (Taken 08/11/2022 1024)  Core/Quality measure requirements - Daily:   VTE Prevention: Ensure anticoagulant(s) administered and/or anti-embolism stockings/devices documented by end of day 2   Ensure antithrombotic administered or contraindication documented by LIP by end of day 2   Once lipid panel has resulted, check LDL. Contact provider for statin order if LDL > 70 (or ensure contraindication documented by LIP).   Continue stroke education (must include Modifiable Risk Factors, Warning Signs and Symptoms of Stroke, Activation of Emergency Medical System and Follow-up Appointments). Ensure handout has been given and documented.  Goal: Stable vital signs and fluid balance  Outcome: Progressing  Flowsheets (Taken 08/11/2022 1024)  Stable vital signs and fluid balance:   Position patient for maximum circulation/cardiac output   Monitor and assess vitals every 4 hours or as ordered and hemodynamic parameters   Monitor intake and output. Notify LIP if urine output is < 30 mL/hour.   Encourage oral fluid intake   Apply telemetry  monitor as ordered  Goal: Patient will maintain adequate oxygenation  Outcome: Progressing  Flowsheets (Taken 08/11/2022 1024)  Patient will maintain adequate oxygenation:   Suction secretions as needed   Maintain SpO2 of greater than 92%  Goal:  Patient's risk of aspiration will be minimized  Outcome: Progressing  Flowsheets (Taken 08/11/2022 1024)  Patient's risk of aspiration will be minimized:   Complete new dysphagia screen for any change in status: Keep patient NPO if patient fails   Monitor/assess for signs of aspiration (tachypnea, cough, wheezing, clearing throat, hoarseness after eating, decrease in SaO2   Place swallow precaution signage above bed   HOB up 90 degrees to eat if unable to be OOB  Goal: Nutritional intake is adequate  Outcome: Progressing  Flowsheets (Taken 08/11/2022 1024)  Nutritional intake is adequate:   Consult/collaborate with Speech Therapy (swallow evaluations)   Consult/collaborate with Clinical Nutritionist  Goal: Skin integrity is maintained or improved  Outcome: Progressing  Flowsheets (Taken 08/11/2022 1024)  Skin integrity is maintained or improved:   Assess Braden Scale every shift   Turn or reposition patient every 2 hours or as needed unless able to reposition self   Increase activity as tolerated/progressive mobility   Relieve pressure to bony prominences   Keep skin clean and dry  Goal: Neurovascular status is stable or improving  Outcome: Progressing  Flowsheets (Taken 08/11/2022 1024)  Neurovascular status is stable or improving:   Monitor/assess neurovascular status (pulses, capillary refill, pain, paresthesia, presence of edema)   Monitor/assess for signs of Venous Thrombus Emboli (edema of calf/thigh redness, pain)  Goal: Effective coping demonstrated  Outcome: Progressing  Flowsheets (Taken 08/11/2022 1024)  Effective coping demonstrated: Offer reassurance to decrease anxiety

## 2022-08-11 NOTE — Progress Notes (Signed)
During bedside handoff, daughter asked for Dr Thedore Mins to follow up with her to see her progress and care  plan. Also, daughter asked for palliative care to call her to see what services they offer. She asked for palliative to be called after pt finishes dialysis. Daughter asked for a date when the wound vac dressing will be changed. Daughter made aware that night RN head concerns and that I would leave a RN note with these requests.

## 2022-08-11 NOTE — Progress Notes (Signed)
[facility]  Progress Note    08/11/2022   Shelby Barron ZOX:[id number] is a 82 y.o. female,       Assessment:     Encephalopathy  Acute CVA  History of bladder cancer and renal failure  Status post radical cystectomy/bilateral nephrectomy, enterectomy and lysis of adhesion (06/28/2022)  S/p small bowel resection  Abdominal wall wound s/p bedside debridement/wound VAC placement (08/02/2022)  CT scan of abdomen and pelvis no acute findings (08/11/2022)  End-stage renal disease on hemodialysis  Atrial fibrillation  Mild leukocytosis-likely reactive  Transfer to ICU for hypotension  No clear source of infection  Risk of aspiration  Vigilance for C. difficile    Plan:     Monitor without antibiotics  Continue local wound care  CBC in a.m.  Follow-up chest x-ray  Continue probiotics  Monitor electrolytes and renal functions closely  Monitor clinically  Discussed with patient  Discussed with nursing staff  Discussed with Dr. Thedore Mins    ROS:     General: No fever, awake, comfortable, pleasantly confused  Respiratory: No cough or shortness of breath  Gastrointestinal: Denies any abdominal pain, soft stools per nursing staff  Genito-Urinary: no hematuria  Musculoskeletal: no edema  Neurological: awake  Dermatological: No rash    Physical Examination:     Blood pressure 104/51, pulse 76, temperature 99.2 F (37.3 C), temperature source Axillary, resp. rate (!) 46, height 1.575 m (5\' 2" ), weight 79.7 kg (175 lb 11.3 oz), SpO2 96%.    General Appearance: Awake  HEENT: Pupils are equal, round, and reactive to light.   Lungs: Decreased breath sounds  Heart: Regular rate and rhythm  Chest: Symmetric chest wall expansion.   Abdomen: soft, no hepatosplenomegaly, no bowel sounds, abdominal wall wound VAC in place  Neurological: awake  Extremities: No edema    Laboratory And Diagnostic Studies:     Recent Labs     08/11/22  0211 08/10/22  2236   WBC 12.69* 11.15*   Hemoglobin 7.7* 8.0*    Hematocrit 24.5* 25.2*   Platelet Count 272 292     Recent Labs     08/11/22  0212 08/10/22  2236   Sodium 143 143   Potassium 4.2 4.1   Chloride 105 105   CO2 23 24   BUN 23* 22*   Creatinine 3.7* 3.6*   Glucose 110* 126*   Calcium 8.3 8.1     Recent Labs     08/11/22  0212 08/10/22  2236 08/10/22  0430   AST (SGOT)  --  11 11   ALT  --  8 10   Alkaline Phosphatase  --  85 91   Protein, Total  --  4.6* 4.9*   Albumin 1.4* 1.5* 1.5*         Current Meds:      Scheduled Meds: PRN Meds:    acetaminophen, 650 mg, Oral, 4 times per day   Or  acetaminophen, 650 mg, Oral, 4 times per day  amiodarone, 200 mg, per NG tube, Daily  apixaban, 2.5 mg, Oral, Q12H SCH  atorvastatin, 40 mg, Oral, QHS  b complex-vitamin c-folic acid, 1 tablet, Oral, Daily  calcium GLUConate, 1 g, Intravenous, Once  darbepoetin alfa, 0.45 mcg/kg, Subcutaneous, Weekly  lidocaine, 20 mL, Intradermal, Once  pantoprazole, 40 mg, per NG tube, Q12H SCH  piperacillin-tazobactam, 2.25 g, Intravenous, Q8H  senna-docusate, 1 tablet, Oral, QHS  thiamine, 100 mg, per NG tube, Daily  umeclidinium-vilanterol, 1 puff, Inhalation,  QAM        Continuous Infusions:       sodium chloride, , PRN  acetaminophen, 650 mg, Q4H PRN   Or  acetaminophen, 650 mg, Q4H PRN   Or  acetaminophen, 650 mg, Q4H PRN  albuterol-ipratropium, 3 mL, Q6H PRN  benzocaine, 1 spray, TID PRN  calcium carbonate, 500 mg, Q4H PRN  dextrose, 15 g of glucose, PRN   Or  dextrose, 12.5 g, PRN   Or  dextrose, 12.5 g, PRN   Or  glucagon (rDNA), 1 mg, PRN  diphenhydrAMINE, 25 mg, Q6H PRN  hydrALAZINE, 10 mg, Q3H PRN  labetalol, 10 mg, Q15 Min PRN  lidocaine 2% jelly, , Once PRN  naloxone, 0.4 mg, PRN  ondansetron, 4 mg, Q6H PRN  phenol, 1 spray, Q2H PRN  polyethylene glycol, 17 g, Daily PRN          Selen Smucker A. Janalyn Rouse, M.D.  08/11/2022  9:30 AM

## 2022-08-11 NOTE — Plan of Care (Signed)
Pt alert and oriented having a conversion with her sister at bedside.vss wdl except sbp on the soft side.transport arrived transfer pt to  nursing center,upon removal of midline,and switched pt to stretcher,pt sbp decreased to 74/56 and was recheck later but pt became lethagic and unreponsive.RRT was actived,[facility] staffs arrived arrived at bedside.pt was transfer back to bed while labs works and new IV lines was placed.Narcans was given,pt became arousal responding to sister's questions.wound vac suction maintained,pt was sent for another CTA and was sent to [facility].bedside report given to [facility].   Problem: Moderate/High Fall Risk Score >5  Goal: Patient will remain free of falls  Outcome: Progressing  Flowsheets  Taken 08/10/2022 1425 by Kathrynn Ducking, RN  High (Greater than 13):   HIGH-Bed alarm on at all times while patient in bed   HIGH-Utilize chair pad alarm for patient while in the chair   HIGH-Apply yellow "Fall Risk" arm band   HIGH-Initiate use of floor mats as appropriate   HIGH-Consider use of low bed  Taken 08/09/2022 0800 by Wess Botts, RN  Moderate Risk (6-13):   MOD-Apply bed exit alarm if patient is confused   MOD-Floor mat at bedside (where available) if appropriate  Taken 07/29/2022 1755 by Lorriane Shire, RN  VH High Risk (Greater than 13):   RED "HIGH FALL RISK" SIGNAGE   BED ALARM WILL BE ACTIVATED WHEN THE PATEINT IS IN BED WITH SIGNAGE "RESET BED ALARM"   A CHAIR PAD ALARM WILL BE USED WHEN PATIENT IS UP SITTING IN A CHAIR   A safety companion may be used when deemed appropriate by the Primary RN and Clinical Administrator   PATIENT IS TO BE SUPERVISED FOR ALL TOILETING ACTIVITIES   Keep door open for better visibility   Include family/significant other in multidisciplinary discussion regarding plan of care as appropriate   Request PT/OT therapy consult order from physician for patients with gait/mobility impairment   Use assistive devices   Use chair-pad alarm device    Use of floor mat   Use of roll guard     Problem: Compromised Sensory Perception  Goal: Sensory Perception Interventions  Outcome: Progressing  Flowsheets (Taken 08/10/2022 1100 by Kathrynn Ducking, RN)  Sensory Perception Interventions: Offload heels, Pad bony prominences, Reposition q 2hrs/turn Clock, Q2 hour skin assessment under devices if present     Problem: Compromised Moisture  Goal: Moisture level Interventions  Outcome: Progressing     Problem: Compromised Activity/Mobility  Goal: Activity/Mobility Interventions  Outcome: Progressing  Flowsheets (Taken 08/10/2022 1100 by Kathrynn Ducking, RN)  Activity/Mobility Interventions: Pad bony prominences, TAP Seated positioning system when OOB, Promote PMP, Reposition q 2 hrs / turn clock, Offload heels     Problem: Compromised Nutrition  Goal: Nutrition Interventions  Outcome: Progressing  Flowsheets (Taken 08/10/2022 1100 by Kathrynn Ducking, RN)  Nutrition Interventions: Discuss nutrition at Rounds, I&Os, Document % meal eaten, Daily weights     Problem: Compromised Friction/Shear  Goal: Friction and Shear Interventions  Outcome: Progressing  Flowsheets (Taken 08/10/2022 1100 by Kathrynn Ducking, RN)  Friction and Shear Interventions: Pad bony prominences, Off load heels, HOB 30 degrees or less unless contraindicated, Consider: TAP seated positioning, Heel foams     Problem: Inadequate Airway Clearance  Goal: Patent Airway maintained  Outcome: Progressing  Flowsheets (Taken 08/05/2022 1255 by Wess Botts, RN)  Patent airway maintained:   Position patient for maximum ventilatory efficiency   Provide adequate fluid intake to liquefy secretions   Suction secretions as  needed   Reinforce use of ordered respiratory interventions (i.e. CPAP, BiPAP, Incentive Spirometer, Acapella, etc.)   Reposition patient every 2 hours and as needed unless able to self-reposition  Goal: Normal respiratory rate/effort achieved/maintained  Outcome: Progressing  Flowsheets (Taken 08/05/2022 1255  by Wess Botts, RN)  Normal respiratory rate/effort achieved/maintained: Plan activities to conserve energy: plan rest periods     Problem: Infection Prevention  Goal: Free from infection  Outcome: Progressing  Flowsheets (Taken 08/10/2022 1425 by Kathrynn Ducking, RN)  Free from infection:   Monitor/assess vital signs   Assess incision for evidence of healing   Assess for signs and symptoms of infection     Problem: Impaired Mobility  Goal: Mobility/Activity is maintained at optimal level for patient  Outcome: Progressing  Flowsheets (Taken 08/10/2022 1425 by Kathrynn Ducking, RN)  Mobility/activity is maintained at optimal level for patient:   Encourage independent activity per ability   Consult/collaborate with Physical Therapy and/or Occupational Therapy     Problem: Constipation  Goal: Fluid and electrolyte balance are achieved/maintained  Outcome: Progressing  Goal: Elimination patterns are normal or improving  Outcome: Progressing  Flowsheets (Taken 08/08/2022 1803 by Wess Botts, RN)  Elimination patterns are normal or improving: Assess for and discuss C. diff screening with LIP     Problem: Safety  Goal: Patient will be free from injury during hospitalization  Outcome: Progressing  Flowsheets (Taken 08/10/2022 1425 by Kathrynn Ducking, RN)  Patient will be free from injury during hospitalization:   Assess patient's risk for falls and implement fall prevention plan of care per policy   Provide and maintain safe environment   Ensure appropriate safety devices are available at the bedside   Hourly rounding   Include patient/ family/ care giver in decisions related to safety  Goal: Patient will be free from infection during hospitalization  Outcome: Progressing  Flowsheets (Taken 08/10/2022 1425 by Kathrynn Ducking, RN)  Free from Infection during hospitalization:   Assess and monitor for signs and symptoms of infection   Monitor all insertion sites (i.e. indwelling lines, tubes, urinary catheters, and drains)    Encourage patient and family to use good hand hygiene technique     Problem: Pain  Goal: Pain at adequate level as identified by patient  Outcome: Progressing  Flowsheets (Taken 08/10/2022 1425 by Kathrynn Ducking, RN)  Pain at adequate level as identified by patient:   Identify patient comfort function goal   Assess for risk of opioid induced respiratory depression, including snoring/sleep apnea. Alert healthcare team of risk factors identified.   Reassess pain within 30-60 minutes of any procedure/intervention, per Pain Assessment, Intervention, Reassessment (AIR) Cycle   Evaluate if patient comfort function goal is met   Evaluate patient's satisfaction with pain management progress   Offer non-pharmacological pain management interventions     Problem: Side Effects from Pain Analgesia  Goal: Patient will experience minimal side effects of analgesic therapy  Outcome: Progressing  Flowsheets (Taken 08/08/2022 1803 by Wess Botts, RN)  Patient will experience minimal side effects of analgesic therapy:   Monitor/assess patient's respiratory status (RR depth, effort, breath sounds)   Prevent/manage side effects per LIP orders (i.e. nausea, vomiting, pruritus, constipation, urinary retention, etc.)   Assess for changes in cognitive function   Evaluate for opioid-induced sedation with appropriate assessment tool (i.e. POSS)     Problem: Hemodynamic Status: Cardiac  Goal: Stable vital signs and fluid balance  Outcome: Progressing  Flowsheets (Taken 08/10/2022 1425 by Kathrynn Ducking, RN)  Stable vital  signs and fluid balance:   Assess signs and symptoms associated with cardiac rhythm changes   Monitor lab values     Problem: Renal Instability  Goal: Fluid and electrolyte balance are achieved/maintained  Outcome: Progressing  Flowsheets (Taken 08/10/2022 1425 by Kathrynn Ducking, RN)  Fluid and electrolyte balance are achieved/maintained:   Monitor/assess lab values and report abnormal values   Monitor for muscle weakness    Observe for cardiac arrhythmias     Problem: Patient Receiving Advanced Renal Therapies  Goal: Therapy access site remains intact  Outcome: Progressing     Problem: Fluid and Electrolyte Imbalance/ Endocrine  Goal: Fluid and electrolyte balance are achieved/maintained  Outcome: Progressing  Goal: Adequate hydration  Outcome: Progressing     Problem: Diabetes: Glucose Imbalance  Goal: Blood glucose stable at established goal  Outcome: Progressing  Flowsheets (Taken 08/08/2022 1803 by Wess Botts, RN)  Blood glucose stable at established goal:   Monitor lab values   Monitor intake and output.  Notify LIP if urine output is < 30 mL/hour.   Follow fluid restrictions/IV/PO parameters   Include patient/family in decisions related to nutrition/dietary selections   Assess for hypoglycemia /hyperglycemia   Coordinate medication administration with meals, as indicated   Monitor/assess vital signs   Ensure appropriate diet and assess tolerance   Ensure adequate hydration   Ensure appropriate consults are obtained (Nutrition, Diabetes Education, and Case Management)   Ensure patient/family has adequate teaching materials     Problem: Inadequate Gas Exchange  Goal: Patent Airway maintained  Outcome: Progressing  Flowsheets (Taken 08/05/2022 1255 by Wess Botts, RN)  Patent airway maintained:   Position patient for maximum ventilatory efficiency   Provide adequate fluid intake to liquefy secretions   Suction secretions as needed   Reinforce use of ordered respiratory interventions (i.e. CPAP, BiPAP, Incentive Spirometer, Acapella, etc.)   Reposition patient every 2 hours and as needed unless able to self-reposition  Goal: Adequate oxygenation and improved ventilation  Outcome: Progressing  Flowsheets (Taken 08/09/2022 2130 by Wess Botts, RN)  Adequate oxygenation and improved ventilation:   Assess lung sounds   Monitor SpO2 and treat as needed   Monitor and treat ETCO2   Provide mechanical and oxygen support to  facilitate gas exchange   Position for maximum ventilatory efficiency   Teach/reinforce use of incentive spirometer 10 times per hour while awake, cough and deep breath as needed   Increase activity as tolerated/progressive mobility   Plan activities to conserve energy: plan rest periods     Problem: Neurological Deficit  Goal: Neurological status is stable or improving  Outcome: Progressing  Flowsheets (Taken 08/07/2022 0132 by Lucky Rathke, RN)  Neurological status is stable or improving: Perform CAM Assessment     Problem: Potential for Aspiration  Goal: Risk of aspiration will be minimized  Outcome: Progressing  Flowsheets (Taken 08/09/2022 2130 by Wess Botts, RN)  Risk of aspiration will be minimized:   Place swallow precaution signage above bed and supervise patient during oral intake   Assess/monitor ability to swallow using dysphagia screen: Keep patient NPO if patient fails screening   Monitor/assess for signs of aspiration (tachypnea, cough, wheezing, clearing throat, hoarseness after eating, decrease in SaO2)   Consult/collaborate/follow recommended modified texture diet/thicken liquids as indicated by Speech Pathologist   Place patient up in chair to eat, if possible/head of bed up 90 degrees to eat if unable to be out of bed   Instruct patient to take small bites, small single sips of  liquid, and do not use a straw     Problem: Every Day - Stroke  Goal: Mobility/Activity is maintained at optimal level for patient  Outcome: Progressing  Flowsheets (Taken 08/10/2022 1425 by Kathrynn Ducking, RN)  Mobility/activity is maintained at optimal level for patient:   Encourage independent activity per ability   Consult/collaborate with Physical Therapy and/or Occupational Therapy  Goal: Elimination patterns are normal or improving  Outcome: Progressing  Flowsheets (Taken 08/08/2022 1803 by Wess Botts, RN)  Elimination patterns are normal or improving: Assess for and discuss C. diff screening with  LIP  Goal: Neurological status is stable or improving  Outcome: Progressing  Flowsheets (Taken 08/07/2022 0132 by Lucky Rathke, RN)  Neurological status is stable or improving: Perform CAM Assessment  Goal: Core/Quality measure requirements - Daily  Outcome: Progressing  Flowsheets (Taken 08/05/2022 1255 by Wess Botts, RN)  Core/Quality measure requirements - Daily:   VTE Prevention: Ensure anticoagulant(s) administered and/or anti-embolism stockings/devices documented by end of day 2   Ensure antithrombotic administered or contraindication documented by LIP by end of day 2  Goal: Stable vital signs and fluid balance  Outcome: Progressing  Goal: Patient will maintain adequate oxygenation  Outcome: Progressing  Flowsheets (Taken 08/09/2022 2130 by Wess Botts, RN)  Patient will maintain adequate oxygenation:   Suction secretions as needed   Maintain SpO2 of greater than 92%  Goal: Patient's risk of aspiration will be minimized  Outcome: Progressing  Flowsheets (Taken 08/09/2022 2130 by Wess Botts, RN)  Patient's risk of aspiration will be minimized:   Complete new dysphagia screen for any change in status: Keep patient NPO if patient fails   Place swallow precaution signage above bed   Monitor/assess for signs of aspiration (tachypnea, cough, wheezing, clearing throat, hoarseness after eating, decrease in SaO2   Order modified texture diet as recommend by Speech Pathologist   Thicken liquids as recommended by Speech Pathologist   Assess and monitor ability to swallow   Keep head of bed up a minimum of 30 degrees when hemodynamically stable   Place patient up in chair to eat, if possible   HOB up 90 degrees to eat if unable to be OOB   Instruct patient to take small bites   Instruct patient to take small single sips of liquid   Consult/Collaborate with Speech Pathologist for dysphagia   Supervise patient during oral intake  Goal: Nutritional intake is adequate  Outcome: Progressing  Flowsheets  (Taken 08/09/2022 2130 by Wess Botts, RN)  Nutritional intake is adequate: Consult/collaborate with Clinical Nutritionist  Goal: Skin integrity is maintained or improved  Outcome: Progressing  Flowsheets (Taken 08/09/2022 2130 by Wess Botts, RN)  Skin integrity is maintained or improved:   Assess Braden Scale every shift   Increase activity as tolerated/progressive mobility   Turn or reposition patient every 2 hours or as needed unless able to reposition self   Relieve pressure to bony prominences   Avoid shearing   Keep skin clean and dry   Encourage use of lotion/moisturizer on skin   Monitor patient's hygiene practices   Collaborate with Wound, Ostomy, and Continence Nurse   Keep head of bed 30 degrees or less (unless contraindicated)  Goal: Neurovascular status is stable or improving  Outcome: Progressing  Flowsheets (Taken 08/09/2022 2130 by Wess Botts, RN)  Neurovascular status is stable or improving:   Monitor/assess neurovascular status (pulses, capillary refill, pain, paresthesia, presence of edema)   Monitor/assess for signs of Venous Thrombus Emboli (edema of calf/thigh  redness, pain)   Monitor/assess site of invasive procedure for signs of bleeding  Goal: Effective coping demonstrated  Outcome: Progressing  Flowsheets (Taken 08/09/2022 2130 by Wess Botts, RN)  Effective coping demonstrated:   Assess/report to LIP uncontrolled anxiety, depression, or ineffective coping   Offer reassurance to decrease anxiety  Goal: Will be able to express needs and understand communication  Outcome: Progressing  Flowsheets (Taken 08/09/2022 2130 by Wess Botts, RN)  Able to express needs and understand communication:   Provide alternative method of communication if needed   Consult/collaborate with Speech Language Pathology (SLP)   Consult/collaborate with Case Management/Social Work   Include patient care companion in decisions related to communication   Patient/patient care companion demonstrates  understanding on disease process, treatment plan, medications and discharge plan     Problem: Day of Discharge - Stroke  Goal: Able to express understanding of discharge instructions and stroke education  Outcome: Progressing  Goal: Core/Quality measures - Discharge  Outcome: Progressing  Flowsheets (Taken 08/11/2022 0055)  Core/Quality measures - Discharge: Document discharge NIH Stroke Scale     Problem: Pain interferes with ability to perform ADL  Goal: Pain at adequate level as identified by patient  Outcome: Progressing  Flowsheets (Taken 08/10/2022 1425 by Kathrynn Ducking, RN)  Pain at adequate level as identified by patient:   Identify patient comfort function goal   Assess for risk of opioid induced respiratory depression, including snoring/sleep apnea. Alert healthcare team of risk factors identified.   Reassess pain within 30-60 minutes of any procedure/intervention, per Pain Assessment, Intervention, Reassessment (AIR) Cycle   Evaluate if patient comfort function goal is met   Evaluate patient's satisfaction with pain management progress   Offer non-pharmacological pain management interventions     Problem: Everyday - Heart Failure  Goal: Mobility/Activity is Maintained at Optimal Level for Patient  Outcome: Progressing  Flowsheets (Taken 08/10/2022 1425 by Kathrynn Ducking, RN)  Mobility/Activity is Maintained at Optimal Level for Patient:   Perform active/passive ROM   Reposition patient every 2 hours and as needed unless able to reposition self  Goal: Nutritional Intake is Adequate  Outcome: Progressing  Flowsheets (Taken 08/05/2022 1255 by Wess Botts, RN)  Nutritional Intake is Adequate:   Fluid Restricction if needed   Consult/Collaborate with Nutritionist   Patient and family teaching on low sodium diet   Cardiac diet-2 gm Sodium   Assess appetite,anorexia and amount of meal/food tolerated   Encourage/perform oral hygiene as appropriate

## 2022-08-11 NOTE — Progress Notes (Signed)
[facility] Consultation Note                                       Date Time: 08/11/22 10:26 AM  Patient Name: Shelby Barron, Shelby Barron  Requesting Physician: Donzetta Sprung, MD  Date of Admission: 06/28/2022    CC / Reason for Consultation: LUE weakness         Assessment:   82y/o woman hx of COPD, CVA, DM, renal cell carcinoma s/p bilateral nephrectomy, [name], new diagnosis of urothelial cancer admitted for elective cystectomy and completion nephrectomy. [name]. Hospital course complicated by new onset A fib with RVR. Neurology consulted for LUE weakness and change in speech.     MRI brain with patchy areas of acute infarct involving bilateral cerebral hemispheres. Pattern suggestive of watershed infarcts. Infarcts likely in setting of drop in blood pressure based on distribution of infarcts on imaging. Symptoms reportedly first noticed after patient returned from OR. She is having waxing and waning mental status; repeat MRI and EEG unrevealing.     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   -cEEG w/ generalized slowing with intermittent GPD's. No seizures     Mental status continues to wax and wane w/ episode of decreased responsiveness w/ SBP in the 70s overnight resulting in RRT.  Change in mental status likely secondary to hypoperfusion. Repeat MRI brain ordered. Mental status improved this morning; appears back to her baseline.     Plan:   -Continue to monitor mental status   -Continue Eliquis 5mg  bid  -Repeat MRI brain   -Continue high intensity statin  -SBP goal 120-160   -SLP.  Encourage PO intake. Consider NGT if pt unable to tolerate oral intake.   -PT/OT evaluation    Meds     Home :   Prior to Admission medications    Medication Sig Start Date End Date Taking? Authorizing Provider   allopurinol (ZYLOPRIM) 100 MG tablet Take 1 tablet  (100 mg) by mouth daily   Yes [provider]   atorvastatin (LIPITOR) 40 MG tablet Take 1 tablet (40 mg) by mouth daily   Yes [provider]   conjugated estrogens (PREMARIN) vaginal cream Place vaginally daily   Yes [provider]   insulin aspart (NOVOLOG) 100 UNIT/ML injection Inject 5-10 Units into the skin as needed   Yes [provider]   Insulin Degludec Evaristo Bury) 100 UNIT/ML Solution Inject 36 Units into the skin daily   Yes [provider]   metoprolol tartrate (LOPRESSOR) 25 MG tablet Take 0.5 tablets (12.5 mg) by mouth daily   Yes [provider]   montelukast (SINGULAIR) 10 MG tablet Take 1 tablet (10 mg) by mouth nightly   Yes [provider]   ondansetron (Zofran) 4 MG tablet Take 1 tablet (4 mg) by mouth every 8 (eight) hours as needed for Nausea 06/04/22  Yes Yousuf, Munib A, MD   oxybutynin XL (DITROPAN-XL) 5 MG 24 hr tablet Take 1 tablet (5 mg) by mouth daily   Yes [provider]   oxyCODONE (OXY-IR) 5 MG capsule Take 1 capsule (5 mg) by mouth every 4 (four) hours as needed   Yes [provider]   pantoprazole (PROTONIX) 40 MG tablet Take 1 tablet (  40 mg) by mouth daily   Yes [provider]   pregabalin (LYRICA) 50 MG capsule Take 1 capsule (50 mg) by mouth Per pt has not been taking   Yes [provider]   sevelamer carbonate (RENVELA) 800 MG tablet Take 1 tablet (800 mg) by mouth 2 (two) times daily   Yes [provider]   torsemide (DEMADEX) 100 MG tablet Take 1 tablet (100 mg) by mouth daily   Yes [provider]   umeclidinium-vilanterol (Anoro Ellipta) 62.5-25 MCG/ACT Aerosol Pwdr, Breath Activated Inhale 1 puff into the lungs daily   Yes [provider]   Zolpidem Tartrate (AMBIEN PO) Take 5 mg by mouth as needed   Yes [provider]   b complex vitamins capsule Take 1 capsule by mouth daily    [provider]   cinacalcet (SENSIPAR) 30 MG  tablet Take 1 tablet (30 mg) by mouth daily    [provider]   clopidogrel (PLAVIX) 75 MG tablet Take 1 tablet (75 mg) by mouth daily    [provider]   hyoscyamine (LEVSIN) 0.125 MG tablet Take 1 tablet (0.125 mg) by mouth every 4 (four) hours as needed for Cramping 06/02/22   Yousuf, Munib A, MD      Inpatient :   Current Facility-Administered Medications   Medication Dose Route Frequency    acetaminophen  650 mg Oral 4 times per day    Or    acetaminophen  650 mg Oral 4 times per day    amiodarone  200 mg per NG tube Daily    apixaban  2.5 mg Oral Q12H SCH    atorvastatin  40 mg Oral QHS    b complex-vitamin c-folic acid  1 tablet Oral Daily    calcium GLUConate  1 g Intravenous Once    darbepoetin alfa  0.45 mcg/kg Subcutaneous Weekly    lidocaine  20 mL Intradermal Once    pantoprazole  40 mg per NG tube Q12H SCH    piperacillin-tazobactam  2.25 g Intravenous Q8H    senna-docusate  1 tablet Oral QHS    thiamine  100 mg per NG tube Daily    umeclidinium-vilanterol  1 puff Inhalation QAM         Physical Exam:   Temp:  [98.4 F (36.9 C)-99.2 F (37.3 C)] 99.2 F (37.3 C)  Heart Rate:  [51-89] 81  Resp Rate:  [10-40] 27  BP: (74-155)/(30-69) 141/63       General: Not fully cooperative with the exam  Neck: supple  CVS: warm and well perfused  Resp: no respiratory distress  Extremities: no pedal edema, no rashes noted    Neurological Examination:  MSE: Awake, alert oriented to name and place.Follows intermittent simple verbal commands  CN: Pupils 4-->70mm b/l, no gaze preference noted. Control and instrumentation engineer. EOMI. ? decreased blink to threat on R. face symmetric. Tongue midline.   Motor: No movement of LUE. RUE antigravity. No antigravity in b/l LE but noted some movement in plane of bed.   Sensory: grimaces to noxious stimuli throughout  Coordination: does not cooperate w/ FTN    Labs:     Results       Procedure Component Value Units Date/Time    Lactic Acid [[id number]]  (Normal) Collected:  08/11/22 0849    Specimen: Blood, Venous Updated: 08/11/22 0904     Whole Blood Lactic Acid 1.5 mmol/L     Renal Function Panel [[id number]]  (Abnormal) Collected: 08/11/22  1610    Specimen: Blood, Venous Updated: 08/11/22 0302     Glucose 110 mg/dL      Sodium 960 mEq/L      Potassium 4.2 mEq/L      Chloride 105 mEq/L      CO2 23 mEq/L      BUN 23 mg/dL      Calcium 8.3 mg/dL      Creatinine 3.7 mg/dL      Albumin 1.4 g/dL      Phosphorus 3.9 mg/dL      Anion Gap 45.4     GFR 11.5 mL/min/1.73 m2     Magnesium [[id number]]  (Normal) Collected: 08/11/22 0212    Specimen: Blood, Venous Updated: 08/11/22 0249     Magnesium 1.9 mg/dL     Culture, Blood, Aerobic And Anaerobic [[id number]] Collected: 08/11/22 0208    Specimen: Blood, Venous Updated: 08/11/22 0239    Culture, Blood, Aerobic And Anaerobic [[id number]] Collected: 08/11/22 0208    Specimen: Blood, Venous Updated: 08/11/22 0239    Ammonia [[id number]]  (Abnormal) Collected: 08/11/22 0209    Specimen: Blood, Venous Updated: 08/11/22 0235     Ammonia 16 umol/L     Lactic Acid [[id number]]  (Abnormal) Collected: 08/11/22 0208    Specimen: Blood, Venous Updated: 08/11/22 0227     Whole Blood Lactic Acid 2.8 mmol/L     Calcium, Ionized [[id number]]  (Abnormal) Collected: 08/11/22 0211    Specimen: Blood, Venous Updated: 08/11/22 0224     Calcium, Ionized 2.20 mEq/L     CBC without Differential [[id number]]  (Abnormal) Collected: 08/11/22 0211    Specimen: Blood, Venous Updated: 08/11/22 0221     WBC 12.69 x10 3/uL      Hemoglobin 7.7 g/dL      Hematocrit 56.4 %      Platelet Count 272 x10 3/uL      MPV 9.7 fL      RBC 2.61 x10 6/uL      MCV 93.9 fL      MCH 29.5 pg      MCHC 31.4 g/dL      RDW 15 %      nRBC % 0.0 /100 WBC      Absolute nRBC 0.00 x10 3/uL     Arterial Blood Gas [[id number]]  (Abnormal) Collected: 08/10/22 1110    Specimen: Blood, Arterial Updated: 08/10/22 2345     Arterial pH 7.499     Arterial pCO2 31.2 mmHg      Arterial pO2 121.0 mmHg      Arterial HCO3  24.0 mEq/L      Arterial CO2 22.8 mEq/L      Arterial Base Excess 1.3 mEq/L      Arterial O2 Saturation 98.6 %      Temperature 37.0 C      O2 Delivery Nasal Cannula     O2 Liter Flow 4.0 L/min     High Sensitivity Troponin-I at 0 hrs [[id number]]  (Normal) Collected: 08/10/22 2236    Specimen: Blood, Venous Updated: 08/10/22 2339     hs Troponin 7.1 ng/L     Comprehensive Metabolic Panel [[id number]]  (Abnormal) Collected: 08/10/22 2236    Specimen: Blood, Venous Updated: 08/10/22 2336     Glucose 126 mg/dL      BUN 22 mg/dL      Creatinine 3.6 mg/dL      Sodium 732 mEq/L      Potassium 4.1 mEq/L      Chloride 105 mEq/L  CO2 24 mEq/L      Calcium 8.1 mg/dL      Anion Gap 09.3     GFR 11.9 mL/min/1.73 m2      AST (SGOT) 11 U/L      ALT 8 U/L      Alkaline Phosphatase 85 U/L      Albumin 1.5 g/dL      Protein, Total 4.6 g/dL      Globulin 3.1 g/dL      Albumin/Globulin Ratio 0.5     Bilirubin, Total 0.2 mg/dL     CBC with Differential (Order) [[id number]]  (Abnormal) Collected: 08/10/22 2236    Specimen: Blood, Venous Updated: 08/10/22 2317    Narrative:      The following orders were created for panel order CBC with Differential (Order).  Procedure                               Abnormality         Status                     ---------                               -----------         ------                     CBC with Differential (C.Marland KitchenMarland Kitchen[[id number]]  Abnormal            Final result                 Please view results for these tests on the individual orders.    CBC with Differential (Component) [[id number]]  (Abnormal) Collected: 08/10/22 2236    Specimen: Blood, Venous Updated: 08/10/22 2317     WBC 11.15 x10 3/uL      Hemoglobin 8.0 g/dL      Hematocrit 61.6 %      Platelet Count 292 x10 3/uL      MPV 10.0 fL      RBC 2.73 x10 6/uL      MCV 92.3 fL      MCH 29.3 pg      MCHC 31.7 g/dL      RDW 15 %      nRBC % 0.0 /100 WBC      Absolute nRBC 0.00 x10 3/uL      Preliminary Absolute Neutrophil Count 6.79 x10 3/uL       Neutrophils % 61.0 %      Lymphocytes % 24.8 %      Monocytes % 12.2 %      Eosinophils % 0.2 %      Basophils % 0.4 %      Immature Granulocytes % 1.4 %      Absolute Neutrophils 6.79 x10 3/uL      Absolute Lymphocytes 2.77 x10 3/uL      Absolute Monocytes 1.36 x10 3/uL      Absolute Eosinophils 0.02 x10 3/uL      Absolute Basophils 0.05 x10 3/uL      Absolute Immature Granulocytes 0.16 x10 3/uL     Whole Blood Glucose POCT [[id number]]  (Abnormal) Collected: 08/10/22 2223    Specimen: Blood, Capillary Updated: 08/10/22 2235     Whole Blood Glucose POCT 138 mg/dL  Rads:     Results for orders placed or performed during the hospital encounter of 06/28/22   CT Head WO Contrast    Narrative    HISTORY: Altered mental status.     COMPARISON: Brain MRI 09/20/2008.    TECHNIQUE: CT of the head performed without intravenous contrast. The  following dose reduction techniques were utilized: automated exposure  control and/or adjustment of the mA and/or KV according to patient size,  and the use of an iterative reconstruction technique.    CONTRAST: None.    FINDINGS:  The ventricles and sulcal pattern are within normal limits for the  patient's stated age. There is mild-moderate supratentorial chronic  ischemic disease. No acute infarct is noted. No intracranial hemorrhage is  seen. There is minimal paranasal sinus disease. There is moderate right  mastoid opacification. 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      Impression       1.No acute intracranial abnormality is seen.    Leandro Reasoner, MD  07/06/2022 3:56 AM     Signed by:  Consepcion Hearing, NP-C  Nurse Practitioner  [facility]  Spectra: [facility] [phone]     *Final recommendations per attending neurologist who will also see patient today and record notes.

## 2022-08-12 LAB — LAB USE ONLY - CBC WITH DIFFERENTIAL
Absolute Basophils: 0.07 10*3/uL (ref 0.00–0.08)
Absolute Eosinophils: 0.02 10*3/uL (ref 0.00–0.44)
Absolute Immature Granulocytes: 0.18 10*3/uL — ABNORMAL HIGH (ref 0.00–0.07)
Absolute Lymphocytes: 2.67 10*3/uL (ref 0.42–3.22)
Absolute Monocytes: 1.29 10*3/uL — ABNORMAL HIGH (ref 0.21–0.85)
Absolute Neutrophils: 6.79 10*3/uL — ABNORMAL HIGH (ref 1.10–6.33)
Absolute nRBC: 0 10*3/uL (ref <=0.00)
Basophils %: 0.6 %
Eosinophils %: 0.2 %
Hematocrit: 23.2 % — ABNORMAL LOW (ref 34.7–43.7)
Hemoglobin: 7.2 g/dL — ABNORMAL LOW (ref 11.4–14.8)
Immature Granulocytes %: 1.6 %
Lymphocytes %: 24.2 %
MCH: 29 pg (ref 25.1–33.5)
MCHC: 31 g/dL — ABNORMAL LOW (ref 31.5–35.8)
MCV: 93.5 fL (ref 78.0–96.0)
MPV: 9.5 fL (ref 8.9–12.5)
Monocytes %: 11.7 %
Neutrophils %: 61.7 %
Platelet Count: 288 10*3/uL (ref 142–346)
Preliminary Absolute Neutrophil Count: 6.79 10*3/uL — ABNORMAL HIGH (ref 1.10–6.33)
RBC: 2.48 10*6/uL — ABNORMAL LOW (ref 3.90–5.10)
RDW: 15 % (ref 11–15)
WBC: 11.02 10*3/uL — ABNORMAL HIGH (ref 3.10–9.50)
nRBC %: 0 /100 WBC (ref <=0.0)

## 2022-08-12 LAB — RENAL FUNCTION PANEL
Albumin: 1.4 g/dL — ABNORMAL LOW (ref 3.5–5.0)
Anion Gap: 15 (ref 5.0–15.0)
BUN: 32 mg/dL — ABNORMAL HIGH (ref 7–21)
CO2: 23 mEq/L (ref 17–29)
Calcium: 8.7 mg/dL (ref 7.9–10.2)
Chloride: 105 mEq/L (ref 99–111)
Creatinine: 4.7 mg/dL — ABNORMAL HIGH (ref 0.4–1.0)
GFR: 8.6 mL/min/{1.73_m2} — ABNORMAL LOW (ref >=60.0)
Glucose: 91 mg/dL (ref 70–100)
Phosphorus: 4.9 mg/dL — ABNORMAL HIGH (ref 2.3–4.7)
Potassium: 3.8 mEq/L (ref 3.5–5.3)
Sodium: 143 mEq/L (ref 135–145)

## 2022-08-12 LAB — MAGNESIUM: Magnesium: 1.8 mg/dL (ref 1.6–2.6)

## 2022-08-12 LAB — WHOLE BLOOD GLUCOSE POCT
Whole Blood Glucose POCT: 89 mg/dL (ref 70–100)
Whole Blood Glucose POCT: 149 mg/dL — ABNORMAL HIGH (ref 70–100)

## 2022-08-12 LAB — CALCIUM, IONIZED: Calcium, Ionized: 2.34 mEq/L (ref 2.23–2.56)

## 2022-08-12 MED ORDER — SODIUM CHLORIDE 0.9 % IV BOLUS
250.0000 mL | INTRAVENOUS | Status: AC | PRN
Start: 2022-08-12 — End: 2022-08-12

## 2022-08-12 MED ORDER — MIDODRINE HCL 5 MG PO TABS
10.0000 mg | ORAL_TABLET | ORAL | Status: DC
Start: 2022-08-12 — End: 2022-08-18
  Administered 2022-08-12 – 2022-08-17 (×3): 10 mg via ORAL
  Filled 2022-08-12 (×3): qty 2

## 2022-08-12 MED ORDER — SODIUM CHLORIDE 0.9 % IV BOLUS
100.0000 mL | INTRAVENOUS | Status: AC | PRN
Start: 2022-08-12 — End: 2022-08-12

## 2022-08-12 MED ORDER — MORPHINE SULFATE 2 MG/ML IJ/IV SOLN (WRAP)
1.0000 mg | Freq: Once | Status: AC
Start: 2022-08-12 — End: 2022-08-12
  Administered 2022-08-12: 1 mg via INTRAVENOUS
  Filled 2022-08-12: qty 1

## 2022-08-12 NOTE — SLP Progress Note (Signed)
[facility]  Speech Therapy Treatment Note    Patient:  LEEYA RUSCONI MRN#:  [id number]  Unit:  [place] [facility] Room/Bed:  A2703/A2703-01      Time of treatment:   SLP Received On: 08/12/22  Start Time: 0740  Stop Time: 0800  Time Calculation (min): 20 min       Personal Protective Equipment (PPE)  Gloves and Procedure Mask with Shield    Subjective:     Pt is alert/ verbal , able to follow some commands       Objective: Oropharyngeal analysis during PO trials in order to determine LRD,development and training of swallow strategies in order to improve swallow function and decrease risk of penetration/aspiration;  therapeutic feedings by SLP  and safe swallowing precautions education.       Assessment: t presents with mild oral pharyngeal dysphagia characterized by delay in bolus transit , delay in pharyngeal swallow trigger and fairly adequate HLE upon subjective digital palpation. Pt tolerated 50% of breakfast and 4 fl oz of Ensure via straw without s/s overt aspiration or penetration            Plan/Recommendations:  IDDSI Pureed - PU4  and Mildly thick  MT2  Slow feeding rate, small bites / sips , upright position during and after the meals , Alternate solid and liquid , full assistance with meals , liquid wash, and check oral cavity for residue         Charges Minutes   SLP treat    Swallow treat 20       08/12/2022  Presley Raddle MS [facility] SLP   08/12/2022 3:21 PM  [phone]

## 2022-08-12 NOTE — Progress Notes (Signed)
[facility] Consultation Note                                       Date Time: 08/12/22 8:45 AM  Patient Name: Shelby Barron  Requesting Physician: Donzetta Sprung, MD  Date of Admission: 06/28/2022    CC / Reason for Consultation: LUE weakness         Assessment:   82y/o woman hx of COPD, CVA, DM, renal cell carcinoma s/p bilateral nephrectomy, [name], new diagnosis of urothelial cancer admitted for elective cystectomy and completion nephrectomy. [name]. Hospital course complicated by new onset A fib with RVR. Neurology consulted for LUE weakness and change in speech.     MRI brain 06/25 with patchy areas of acute infarct involving bilateral cerebral hemispheres. Pattern suggestive of watershed infarcts. Infarcts likely in setting of drop in blood pressure based on distribution of infarcts on imaging. Symptoms reportedly first noticed after patient returned from OR. She is having waxing and waning mental status; repeat MRI and EEG unrevealing.     Repeat MRI brain 07/24 with no acute process.     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   -cEEG w/ generalized slowing with intermittent GPD's. No seizures     Mental status continues to wax and wane w/ episode of decreased responsiveness w/ SBP in the 70s overnight resulting in RRT.  Change in mental status likely secondary to hypoperfusion. Repeat MRI brain ordered. Mental status improved this morning; appears back to her baseline.     Plan:   -Continue to monitor mental status   -Continue Eliquis 5mg  bid  -Continue high intensity statin  -SBP goal 120-160   -SLP.  Encourage PO intake. Consider NGT if pt unable to tolerate oral intake.   -PT/OT evaluation  -Neurology will follow along as needed        Signed by:  Vanessa [facility]  Spectra: IAH [phone]

## 2022-08-12 NOTE — Progress Notes (Addendum)
LOS # [id number]      Summary of Discharge Plan: [facility], private pay       Identified Possible Discharge Barriers: Patient is scheduled for HD today.      CM Interventions and Outcome: CM spoke with Dr. Thedore Mins and Palliative Care. Plan for discharge to SNF today.   CM spoke with Cassi with Admissions at [facility] [phone], notified her of anticipated discharge today. They can still accept her with same room and report #.  Room number: 310   Report: [phone] Ext [phone]   Transport will need to be set up through [facility].    ADDENDUM: CM received update that patient not ready for discharge today due to variable BP and downtrending hemoglobin. CM updated Admissions liaison Cassi.      Discussed above Discharge Plan with (patient, family, Care Team, others): care team, daughter, pt      Case Management will continue to follow for [facility] needs that may arise.      Marlan Palau, MSW, ACM-SW  Social Worker Case Manager II  [facility]

## 2022-08-12 NOTE — Progress Notes (Signed)
[facility]  PROGRESS NOTE      Patient: Shelby Barron  Date: 08/12/2022   LOS: 45 Days  Admission Date: 06/28/2022   MRN: [id number]  Attending: Donzetta Sprung, MD  When on service as the attending, please contact me on Epic Secure Chat from 7 AM - 7 PM for non-urgent issues. For urgent matters use XTend page from 7 AM - 7 PM.       ASSESSMENT/PLAN     YUSRA RAVERT is a 82 y.o. female admitted with Bladder cancer    Interval Summary:     Assessment and plan:     Hypotension  Encephalopathy  -Considering to be more vasovagal  -CT without contrast denies nasal congestion or sore throat for any acute findings apart from the abdominal wound from recent procedure  -Blood pressure stable, given midodrine prior to HD  -Mentation is improved compared to previously alert and oriented x 2  -Continue neurochecks every 4 hours     Acute toxic metabolic Encephalopathy/delirium stable-improved  Possible underlying cognitive impairment  - On and off confusion alert oriented x 2, now relatively stable the last several days  - Patient was upgraded to ICU on 6/30 for lethargy and decreased responsiveness. CT head was with no acute finding.   - EEG consistent with encephalopathy no seizure, CVA management as below  - SLP advanced diet. NG tube removed 7/9.  - Palliative consulted:  Family was clear they would like no limits to care and patient remained full code.     Anemia of CKD and acute blood loss anemia  -Hemoglobin trending down, 7.2 from 7.7 previously  -Will monitor in the morning, threshold for transfusion is below 7.0  - given Aranesp this admission per nephrology  - No signs of active bleeding.     S/p open cystectomy, bilateral nephrectomies, and resection of small bowel after intra-op enterotomy    Mixed shock - hemorrhagic vs septic: Resolved  Necrotic wound tissue  - 6/10 small bowel enteretomy with 1L EBL. Went to the OR on June 12 for small bowel anastomosis, closure of mesenteric defect, closure of  abdominal wall with wound VAC application. Wound VAC in place. Will need outpatient wound care follow up for ongoing management.  - Surgery consulted, CT abdomen with and without contrast did not show any anastomotic leak or abscess.   - Status post wound debridement by general surgery at bedside on 7/15 for necrotic wound tissue  - ID consulted, completed meropenem and Mycamine for 2 weeks on 7/3. No further abx recommended after bedside debridement on 7/15  Wound care team recommendation.    When patient discharged or VAC discontinued -   Remove all foam dressing and non adherent dressing and place hydrogel moistened gauze into the wound bed, cover with ABD pad tape to secure.   Wound Vac machine - discard canister and tubing.    Place wound vac in CLEAR plastic bag and place in the soiled utility room. DO NOT USE RED BIOHAZARD BAGS.    Please contact [facility] office [phone] and leave a message for [facility] to pick up.        Acute CVA   Petechial hemorrhage vs cortical laminar necrosis  - Stopped ASA and started Eliquis per neurology on 7/5 after >10 days after CVA. Continue statin therapy.  - Carotid Doppler showed plaque with no critical stenosis  - Head CT on 7/7 w/ a few areas of cortical hyperdensity - petechial hemorrhage vs cortical laminar  necrosis. Discussed with neuro OK to continue eliquis for now. Repeat head CT on 7/8 stable.  - EEG encephalopathic pattern no seizure, repeat MRI no new infarction  - PT OT recommend SNF on discharge     Acute hypoxemic respiratory failure resolved  COPD  - Extubated on 6/13, currently on room air  - X-ray repeated on 6/29 showed resolution of interstitial edema     ESRD  S/p CRRT this admission, now on HD TTS  Continue dialysis on routine basis as outpatient  IR removed the nonfunctional permacath and right Quinton IJ cath and replaced with a new permacath on 6/26.     New onset Afib with RVR, improved  - CHA2DS2-VASc score is 7.   - Status post amiodarone drip. Now stable on  oral amiodarone and coreg per cardiology recommendations  - GI and general surgery cleared for anticoagulation. Started on Eliquis since 07/23/22 with neurology clearance after CVA     Renal cell carcinoma outpatient follow-up hematology oncology  Urothelial carcinoma                    Nutrition: Pured and mildly thick  Malnutrition Documentation    Severe malnutrition related to inadequate oral intake in the setting of acute illness as evidenced by PO intake meeting <50% of needs for >5 days, moderate-severe muscle loss (temporalis, deltoid, interosseous, quadriceps, gastrocnemius, trapezius, supraspinatus, infraspinatus), moderate fat loss (orbital, buccal, triceps), and 13% weight loss in >1 month          Patient has BMI=Body mass index is 33.63 kg/m.  Diagnosis: Obesity based on BMI criteria         Recent Labs   Lab 08/12/22  0521 08/11/22  0211 08/10/22  2236   Hemoglobin 7.2* 7.7* 8.0*   Hematocrit 23.2* 24.5* 25.2*   MCV 93.5 93.9 92.3   WBC 11.02* 12.69* 11.15*   Platelet Count 288 272 292       Recent Labs   Lab 07/18/22  0421 06/02/22  0514   Ferritin 2,300.21*  --    Iron 18* 62   TIBC 99* 145*   Iron Saturation 18 43         Code Status: Full Code    Dispo: Possible discharge tomorrow if hemoglobin is stable    Family Contact: Daughter, spoken to on the phone    DVT Prophylaxis:   Current Facility-Administered Medications (Includes Only Anticoagulants, Misc. Hematological)   Medication Dose Route Last Admin    apixaban (ELIQUIS) tablet 2.5 mg  2.5 mg Oral 2.5 mg at 08/11/22 2053          CHART  REVIEW & DISCUSSION     The following chart items were reviewed as of 2:55 PM on 08/12/22:  [x]  Lab Results [x]  Imaging Results   [x]  Problem List  [x]  Current Orders [x]  Current Medications  []  Allergies  []  Code Status [x]  Previous Notes   []  SDoH    The management and plan of care for this patient was discussed with the following specialty consultants:  []  Cardiology  []  Gastroenterology                 []   Infectious Disease  []  Pulmonology [x]  Neurology                []  Nephrology  []  Neurosurgery []  Orthopedic Surgery  []  Heme/Onc  []  General Surgery []  Psychiatry                                   [  x] Palliative    SUBJECTIVE     KHALISE BILLARD is a 82 year old female seen examined at bedside.  Palliative medicine and primary medicine along with patient's nurse in the room during this encounter.  Daughter called and updated over the phone.  Possible discharge likely tomorrow    MEDICATIONS     Current Facility-Administered Medications   Medication Dose Route Frequency    acetaminophen  650 mg Oral 4 times per day    Or    acetaminophen  650 mg Oral 4 times per day    amiodarone  200 mg per NG tube Daily    apixaban  2.5 mg Oral Q12H [facility]    atorvastatin  40 mg Oral QHS    b complex-vitamin c-folic acid  1 tablet Oral Daily    darbepoetin alfa  0.45 mcg/kg Subcutaneous Weekly    lidocaine  20 mL Intradermal Once    midodrine  10 mg Oral Q Tue/Thu/Sat (AM)    pantoprazole  40 mg per NG tube Q12H [facility]    senna-docusate  1 tablet Oral QHS    thiamine  100 mg per NG tube Daily    umeclidinium-vilanterol  1 puff Inhalation QAM       PHYSICAL EXAM     Vitals:    08/12/22 1445   BP: 104/53   Pulse: 72   Resp:    Temp:    SpO2:        Temperature: Temp  Min: 97.2 F (36.2 C)  Max: 98.9 F (37.2 C)  Pulse: Pulse  Min: 59  Max: 89  Respiratory: Resp  Min: 18  Max: 34  Non-Invasive BP: BP  Min: 85/53  Max: 146/65  Pulse Oximetry SpO2  Min: 93 %  Max: 97 %    Intake and Output Summary (Last 24 hours) at Date Time    Intake/Output Summary (Last 24 hours) at 08/12/2022 1455  Last data filed at 08/12/2022 1100  Gross per 24 hour   Intake 535 ml   Output 250 ml   Net 285 ml         GEN APPEARANCE: Oriented x 2, on room air  HEENT: Anicteric  NECK: Supple  CVS: RRR, S1, S2  LUNGS: Decreased breath sounds at bases  ABD: Soft; + Normoactive BS, wound VAC/sponge on with dressing in place  EXT: No edema;   NEURO: No new focal deficits  noted  LABS     Recent Labs   Lab 08/12/22  0521 08/11/22  0211 08/10/22  2236   WBC 11.02* 12.69* 11.15*   RBC 2.48* 2.61* 2.73*   Hemoglobin 7.2* 7.7* 8.0*   Hematocrit 23.2* 24.5* 25.2*   MCV 93.5 93.9 92.3   Platelet Count 288 272 292       Recent Labs   Lab 08/12/22  0521 08/11/22  0212 08/10/22  2236 08/10/22  0430 08/08/22  0420   Sodium 143 143 143 141 142   Potassium 3.8 4.2 4.1 4.9 3.8   Chloride 105 105 105 100 103   CO2 23 23 24 25 27    BUN 32* 23* 22* 50* 23*   Creatinine 4.7* 3.7* 3.6* 6.2* 3.9*   Glucose 91 110* 126* 83 115*   Calcium 8.7 8.3 8.1 8.8 8.8   Magnesium 1.8 1.9  --   --   --        Recent Labs   Lab 08/12/22  0521 08/11/22  0212 08/10/22  2236 08/10/22  0430 08/08/22  0420   ALT  --   --  8 10 8    AST (SGOT)  --   --  11 11 10    Bilirubin, Total  --   --  0.2 0.3 0.3   Albumin 1.4* 1.4* 1.5* 1.5* 1.7*   Alkaline Phosphatase  --   --  85 91 95       Recent Labs   Lab 08/10/22  2236   hs Troponin 7.1             Microbiology Results (last 15 days)       Procedure Component Value Units Date/Time    Culture, Blood, Aerobic And Anaerobic [[id number]] Collected: 08/11/22 0208    Order Status: Completed Specimen: Blood, Venous Updated: 08/12/22 0700     Culture Blood No growth at 1 day    Culture, Blood, Aerobic And Anaerobic [[id number]] Collected: 08/11/22 0208    Order Status: Completed Specimen: Blood, Venous Updated: 08/12/22 0700     Culture Blood No growth at 1 day             RADIOLOGY     CT Abdomen Pelvis WO IV/ WO PO Cont    Result Date: 08/11/2022  1. Colonic diverticulosis. No findings to suggest acute diverticulitis or colitis. 2. Small ascites and retroperitoneal free fluid, increased. 3. Nonspecific gallbladder distention. 4. Hiatal hernia. 5. Open midline wound. 6. Left flank hernia containing ascites. Kennyth Lose, MD 08/11/2022 4:23 PM    MRI Brain WO Contrast    Result Date: 08/11/2022  Motion degraded exam. No acute intracranial abnormality. No new interval infarct. Leota Jacobsen, MD 08/11/2022 12:51 PM    XR Chest AP Portable    Result Date: 08/11/2022  Mild left basilar airspace opacities and small bilateral pleural effusions. Judd Gaudier, MD 08/11/2022 2:48 AM    CT Head WO Contrast    Result Date: 08/11/2022   Redemonstrated multiple areas of subacute infarction with cortical hyperdensities which favor petechial hemorrhage in the right frontal lobe and bilateral parietal lobes. These are similar to the prior examination on 07/26/2022. No evidence of new or progressive acute infarct. No mass effect. Vernie Murders, MD 08/11/2022 12:00 AM    XR Chest AP Portable    Result Date: 08/02/2022   Hypoventilation with basilar atelectasis. Mild central vascular congestion. Charlott Rakes, MD 08/02/2022 3:23 PM    CT Head WO Contrast    Result Date: 07/26/2022   1.No significant change in multifocal areas of subacute infarction within bilateral cerebral hemispheres. 2.Small areas of petechial hemorrhage in the left parietal and occipital lobes are unchanged. No new hemorrhage or measurable parenchymal hematoma. 3.Persistent complete opacification of the right mastoid air cells and middle ear cavity. Garnette Gunner, MD 07/26/2022 12:27 PM    CT Head WO Contrast    Result Date: 07/25/2022   1.Redemonstration of multifocal areas of bilateral acute infarctions. A few areas of cortical hyperdensity likely represent petechial hemorrhage. No mass occupying hematoma. No significant mass effect or midline shift. 2.Redemonstrated right mastoid and right middle ear cavity effusion with areas of bony thinning versus dehiscence of the right tegmen tympani and right tegmen mastoideum. Vara Guardian, MD 07/25/2022 2:19 PM    CT Abdomen Pelvis W IV And PO Cont    Result Date: 07/19/2022   1.Slight interval increase in left-sided retroperitoneal fluid collection with the other abdominal fluid collections appearing relatively stable. 2. There is no definite abscess or acute inflammatory process. 3.  Incidental note is made of a  small paraesophageal type hernia. 4. Sigmoid diverticulosis is noted without CT evidence of diverticulitis. 5. Other findings as noted above. Miguel Dibble, MD 07/19/2022 6:36 AM    MRI Brain WO Contrast    Result Date: 07/18/2022   Interval evolution of multifocal recent infarcts in the bilateral cerebral hemispheres. No new infarct. Milan Waunita Schooner, MD 07/18/2022 5:57 PM    XR Chest AP Portable    Result Date: 07/17/2022  1.Near-complete resolution of interstitial edema. 2.Improving pulmonary vascular congestion. Legrand Pitts, MD 07/17/2022 2:38 PM    CT Head WO Contrast    Result Date: 07/17/2022   1.No new acute intracranial pathology. 2.There are multiple regions of cytotoxic edema in the right > left frontal and left > right occipital lobes, consistent with known early subacute infarcts. Dannielle Burn 07/17/2022 8:26 AM    US Carotid Duplex Dopp Comp Bilateral    Result Date: 07/15/2022   1. Plaque formation in the right internal carotid artery bulb with elevated velocities. Stenosis is estimated at 50-69%. 2. Plaque in the left internal carotid artery with normal velocities. Any stenosis is less than 50%. Criteria for stenosis and velocity parameters are based on the guidelines of the SRU (Society of Radiologists in Ultrasound). Grant et al: Carotid Artery Stenosis: Grayscale and Doppler Ultrasound Diagnosis-- Society of Radiologists in Ultrasound Consensus Conference Ultrasound Quarterly. Volume 19, Number 4; December, 2003. Recommendations updated October 2021 Verne Carrow is an [facility] accredited facility Suszanne Finch, MD 07/15/2022 9:41 AM    Tunneled Cath Removal (Permcath)    Result Date: 07/14/2022  1. Nonfunctioning tunneled dialysis catheter as described above. 2. Successful placement of a new 19 cm Equistream catheter as described above. Dimitrios Papadouris, MD 07/14/2022 4:06 PM    Tunneled Cath Placement (Permcath)    Result Date: 07/14/2022  1. Nonfunctioning tunneled dialysis catheter as described above. 2.  Successful placement of a new 19 cm Equistream catheter as described above. Dimitrios Papadouris, MD 07/14/2022 4:06 PM    MRI Brain WO Contrast    Result Date: 07/13/2022   1.Patchy areas of acute/recent infarction involving the cerebral hemispheres bilaterally. The infarcts have a configuration suggesting watershed territory infarcts. 2.Chronic ischemic changes as discussed. Georgann Housekeeper, MD 07/13/2022 10:28 PM    CT Head WO Contrast    Result Date: 07/13/2022   1.New region of loss of gray-white matter differentiation in the right frontal lobe, concerning for acute infarction. 2.A few additional areas of poor gray-white matter differentiation for example in the left occipital lobe may be artifactual but could be correlated with MRI. 3.No evidence of acute intracranial hemorrhage or significant mass effect. These urgent results were discussed with and acknowledged by Dr. Don Broach on 07/13/2022 3:20 PM. Vara Guardian, MD 07/13/2022 3:21 PM   Echo Results       None          Results for orders placed or performed during the hospital encounter of 06/28/22   MRI Brain WO Contrast    Narrative    HISTORY: Stroke, follow up. watershed infarcts this admission, now with  recurrent AMS, rule out recurrent stroke.      COMPARISON: Brain MRI 07/18/2022    TECHNIQUE: MRI of the brain performed on a 1.5 Tesla scanner without  intravenous contrast. per acute stroke protocol. Axial diffusion,  susceptibility, and FLAIR weighted sequences were obtained.     CONTRAST: None.    FINDINGS:   Intracranial: This exam is somewhat motion  degraded which limits  sensitivity, but with diagnostic images still obtained. The below findings  are within this context.    There is no acute hemorrhage or acute infarct. Evolving change from prior  multifocal infarcts in bilateral cerebral hemispheres in the left MCA, ACA,  PCA watershed territories, with associated susceptibility artifact likely  representing old petechial hemorrhage. No new infarcts. T2  shine through on  DWI. Symmetric ventricular and sulci enlargement consistent with volume  loss. There are nonspecific bilateral hemispheric periventricular, deep,  and subcortical white matter T2 FLAIR hyperintensities consistent with  chronic microangiopathic change. No masses or fluid collections.    Partially empty sella with expanded sella turcica. The cerebellar tonsils  are above the foramen magnum.     Vessels: Usual flow void appearance in the major arteries and veins  Bone: There are no suspicious calvarial or skull base lesions.  Extracranial: Status post bilateral lens surgery. The visualized paranasal  sinuses are predominantly clear.. Complete opacification of the right  mastoid air cells.      Impression    Motion degraded exam. No acute intracranial abnormality. No new interval  infarct.    Leota Jacobsen, MD  08/11/2022 12:51 PM   CT Head WO Contrast    Narrative    HISTORY: Altered mental status.    COMPARISON: 07/26/2022.    TECHNIQUE: CT of the head performed without intravenous contrast.  Multiplanar reformatted images were created and reviewed. The following  dose reduction techniques were utilized: automated exposure control and/or  adjustment of the mA and/or KV according to patient size, and the use of an  iterative reconstruction technique.    CONTRAST: None.    FINDINGS:  Redemonstrated multiple areas of subacute infarction with cortical  hyperdensities which favor petechial hemorrhage in the right frontal lobe  and bilateral parietal lobes. These are similar to the prior examination on  07/26/2022.    No evidence of new or progressive acute infarct. No mass effect. No  evidence of new or progressive acute intracranial hemorrhage. No  extra-axial collection is seen.    Stable volume loss and hypodensities in the white matter consistent with  chronic ischemic changes.      Impression       Redemonstrated multiple areas of subacute infarction with cortical  hyperdensities which favor  petechial hemorrhage in the right frontal lobe  and bilateral parietal lobes. These are similar to the prior examination on  07/26/2022. No evidence of new or progressive acute infarct. No mass  effect.    Vernie Murders, MD  08/11/2022 12:00 AM       Signed,  Donzetta Sprung, MD  2:55 PM 08/12/2022

## 2022-08-12 NOTE — Progress Notes (Signed)
Wound Ostomy Continence Consultation / Progress Note    Date Time: 08/12/22 11:28 AM  Patient Name: Shelby Barron, Shelby Barron  Consulting Service: [facility] Day: 44     Reason for Consult / Follow Up   Vac Dressing Change    Assessment & Plan   Assessment:   At bedside for vac change. Patient premedicated for pain. Patient crying out, comfort measures given.  Attending RN at bedside.       Location:  Surgical Wound Midline Abdomen/ RLQ wound -peri wound  Wound 06/30/22 Surgical Incision Abdomen Other (Comment) WOUND VAC applied (Active)   Date First Assessed/Time First Assessed: 06/30/22 1152   Wound Type: Surgical Incision  Location: Abdomen  Wound Location Orientation: Other (Comment)  Wound Description (Comments): WOUND VAC applied      Assessments 08/12/2022 11:00 AM   Wound Image      Wound Base Description Bleeding;Dark edges;Fibrin/slough;Full Thickness;Granulation tissue;Moist;White   % Granular 75   % Non-viable 25   Structures Present Tendon (fascia,)   Peri-wound Description Brown;Excoriated;Fragile;Hyperpigmented;Maceration   Wound Length (cm) 24 cm   Wound Width (cm) 7 cm   Wound Depth (cm) 6.5 cm   Wound Surface Area (cm^2) 168 cm^2   Wound Volume (cm^3) 1092 cm^3   Closure Open   Drainage Amount Small   Drainage Description Serosanguinous   Tunneling No   Undermining  No   Treatments Negative Pressure Wound Therapy   Dressing Non Adherent Contact Layer;Foam;Bulky Gauze  Roll (vashe wash)    Wound Edges: Right lower Quadrant wound edges between 6 and 9 O'clock (peri wound debrided) 7 cm x 8 cm x 0.1 cm (full thickness skin exposed brown subcutaneous tissue visible, dark pink moist wound bed)  PeriWound: hyper pigmented wound between 6 and 9 O'clock, hyperpigmented (with pale pink wound edges around RLQ)    Wound proximal portion seems to be improving.RLQ and Distal portion of wound noted some yellow slough, fibrinous tissue, dark discoloration noted subcutaneous.       Wound care to mid abdomen/ Vac  Dressing Change     Area cleansed with Vashe wash (soaked for 5-10 minutes ) and  patted dry.    Applied  No-Sting skin barrier film to peri-wound and allowed to dry.   Placed Vac drape around peri-wound window paned style, to prevent maceration and applied Eakin's ring around umbilicus, top of wound, peri wound RLQ wound and along the pannus and  curvature of pannus for more adherence.     Packed 1 pc (cut into two pieces) of silver non adherent dressing and 3 pieces of of black foam into the wound (two pieces mid-abdomen bridged to RLQ peri wound 1 pc)  Secured with vac drape.   A quarter size cut hole was cut into the black foam to fit the trackpad.   Good seal obtained and wound vacuum setting at 125 mm Hg, low intensity continuous.    Aseptic technique utilized          Vashe Wound Solution helps to cleanse the wound and accomplish the goals of wound bed preparation in a biocompatible, safe, effective, and natural way. This solution of hypochlorous acid (HOCl) acts as a preservative that inhibits microbial contamination within the solution. Vashe Wound Solution has the highest concentration of pure HOCl.     Vashe comes from supply rooms or central supply.     Wound Photography:       Plan/Follow-Up:     Continue pressure prevention bundle:  Head of the bed 30 degrees or less  Positioning device to the bedside  Eliminate/minimize pressure from the area  Float heels with boots or pillows  Turn patient  Pressure redistribution cushion to the chair  Use lift sheets/low friction surface sheets for positioning.  Pad bony prominences  Nutrition consult/Optimize nutrition  Initiate bed algorithm/Specialty bed  Moisture/Incontinence management - Cleanse with incontinence cleansing wipe/water to manage incontinence and protect skin from exposure to urine and stool. Apply skin barrier protection cream. Apply [place]/external or female external urine management system to prevent urinary contamination of a wound. Apply  rectal pouch/fecal management system per unit policy, to prevent fecal contamination of a wound or contain diarrhea.            1.  Wound care orders entered into EMR.   2.  Care rendered.  3.  RN checks wound VAC Q 2 hr: Sponge contracted;  pump "ON" and plugged in.    4.  If unable to maintain the seal, apply Tegaderm dressing or vac drape to seal. May also contact [facility] at 1 - [phone] to troubleshoot.  5.  If still unable to maintain the seal and vac off or not working for 2 hours, remove all foam and place hydrogel moistened gauze into wound bed, cover with ABD pad secured with tape, and notify Physician and WOC team.          6.  Change two-to three times a week. Next vac change due on Monday     When patient discharged or VAC discontinued -   Remove all foam dressing and non adherent dressing and place hydrogel moistened gauze into the wound bed, cover with ABD pad tape to secure.   Wound Vac machine - discard canister and tubing.    Place wound vac in CLEAR plastic bag and place in the soiled utility room. DO NOT USE RED BIOHAZARD BAGS.    Please contact WOC office [phone] and leave a message for WOC to pick up.        Discharge VAC Wound Information     Prescribed [facility] VAC Therapy for duration:  4 months      Discharge VAC Wound Information     Surgical Wound  mid abdomen/RLQ  Age of wound: 06/28/22  Did VAC initiate at the hospital?     yes         Initiated - 06/28/22  Compromised Nutritional status? no Nutrition consulted for wound care needs.  Other therapies previously tried: no.  Is there eschar present in the wound: no  Has wound been debrided in the last ten days:   yes 08/01/22  Are serial debridements required: no  Location: mid abdomen and RLQ      Measurements: Date- 08/12/22 Surgical Wound Midline                            Length- 24 cm                             Width- 7 cm                            Depth-  6.5 cm                            Undermining- no  cm  Tunneling-  no cm     Right lower Quadrant wound edges between 6 and 9 O'clock (peri wound debrided) 7 cm x 8 cm x 0.1 cm (full thickness skin exposed subq tissue visible)wound bed red, pink and tan sub cutaneous tissue     The appearance of wound bed and color: red, pink, brown  Exudate: sanguinous  Is wound full-thickness: yes  Is bone, tendon or muscle exposed: yes  Is there undermining no  Is there tunneling no     7.   Type of Foam used: black simplace   Number of pieces used: 3 black  (3 pieces of of black foam into the wound -two pieces mid-abdomen bridged to RLQ peri wound 1 pc)   Contact layer applied: silver non adherent  Continuous negative pressure setting: 125 mm Hg, low, intensity continuous     Has the WOCN team attempted other dressings: no    WOCN assessed clinical reasons for the continuation of VAC at home:  Promotes wound healing, Speed granulation tissue, prevent infection         Specialty Bed: Progressa mattress - combines Engineer, civil (consulting), incontinence management systems, and StayInPlace technology to address the five factors of skin breakdown - pressure, shear, friction, heat, and moisture - for optimal wound healing and skin protection.     History of Present Illness   This is a 82 y.o. female  has a past medical history of Acid reflux, Asthma, well controlled, Cancer of kidney, Chronic lower back pain, Congestive heart disease, COPD (chronic obstructive pulmonary disease), CVA (cerebral infarction) (01/12/2003), Diabetes, Diabetic nephropathy, Fibroids, Gout, H/O: gout, Hyperlipidemia, Hypertension, Neuropathy of hand, and SOB (shortness of breath)..  Admitted with Bladder cancer.        Procedure(s) with comments:  TUNNELED CATH REMOVAL - right neck/chest tunneled HD catheter removal    45 Days Post-Op  -------------------  **Canceled**    Procedure(s) with comments:  TUNNELED CATH REMOVAL - right neck/chest tunneled HD catheter removal    * No surgery date entered  *  -------------------    Procedure(s) with comments:  TUNNELED CATH REMOVAL - right neck/chest tunneled HD catheter removal    43 Days Post-Op  -------------------    Procedure(s) with comments:  TUNNELED CATH REMOVAL - right neck/chest tunneled HD catheter removal    29 Days Post-Op  -------------------    Procedure(s) with comments:  TUNNELED CATH REMOVAL - right neck/chest tunneled HD catheter removal    29 Days Post-Op  -------------------        From Nursing/Other Documentation:   Bowel Incontinence: Yes (08/12/22 0400)  Last BM Date: 08/11/22 (08/11/22 2000)  Urinary Incontinence: No (08/12/22 0400)  Braden: Braden Scale Score: 10 (08/11/22 2000)  Braden Subscales:  Sensory Perceptions: Very limited (08/11/22 2000)  Moisture: Occasionally moist (08/11/22 2000)  Activity: Bedfast (08/11/22 2000)  Mobility: Very limited (08/11/22 2000)  Nutrition: Very poor (08/11/22 2000)  Friction and Shear: Problem (08/11/22 2000)    Current Diet:   Supervise For Meals Frequency: All meals  Magic Cup Quantity: A. One; Frequency: BID (2 times a day) - with Lunch and Dinner  Adult diet Therapeutic/ Modified; Solid; Pureed (IDDSI level 4); Mildly thick (IDDSI level 2); Room service: No  Ensure Plus High Protein Supplement Quantity: A. One; Flavor: Vanilla; Frequency: TID (3 times a day) with meals       Ht Readings from Last 1 Encounters:   08/11/22 1.575 m (5\' 2" )     Wt Readings from  Last 3 Encounters:   08/12/22 83.4 kg (183 lb 13.8 oz)   06/25/22 87.1 kg (192 lb)   06/23/22 84.8 kg (187 lb)     Body mass index is 33.63 kg/m.    Recent Labs     08/12/22  0521   WBC 11.02*   RBC 2.48*   Hemoglobin 7.2*   Hematocrit 23.2*   Sodium 143   Potassium 3.8   Chloride 105   CO2 23   BUN 32*   Creatinine 4.7*   Calcium 8.7   Albumin 1.4*   GFR 8.6*   Glucose 91     Lab Results   Component Value Date    HGBA1C 5.6 07/14/2022    HGBA1C 8.5 (H) 05/27/2022    GLU 91 08/12/2022    LDL 23 07/14/2022    CREAT 4.7 (H) 08/12/2022     Recent  Labs   Lab 08/12/22  0504 08/11/22  2308 08/11/22  1751 08/11/22  1231 08/10/22  2223 08/08/22  2139 08/07/22  0728 08/06/22  2123 08/06/22  1722 08/06/22  1150   Whole Blood Glucose POCT 89 117* 123* 114* 138* 154* 101* 141* 98 162*     No results for input(s): "CK" in the last 72 hours.        Eugena Rhue "Pepper" Bunny Lowdermilk  MSN, RN, WOCN  [facility]  Wound, Ostomy, and  Continence Coordinator  [phone] Office  [phone] [employer]  [email or web address]

## 2022-08-12 NOTE — Progress Notes (Signed)
HD flow sheets, CXR, HBsSg attached to referral in Careport per request of hosp CM.     Gypsy Decant, HD Care Coordinator  [phone]

## 2022-08-12 NOTE — Treatment Plan (Cosign Needed)
4 eyes in 4 hours pressure injury assessment note:      Completed with:   Unit & Time admitted:              Bony Prominences: Check appropriate box; if wound is present enter wound assessment in LDA     Occiput:                 [x] WNL  []  Wound present  Face:                     [x] WNL  []  Wound present  Ears:                      [x] WNL  []  Wound present  Spine:                    [x] WNL  []  Wound present  Shoulders:             [x] WNL  []  Wound present  Elbows:                  [x] WNL  []  Wound present  Sacrum/coccyx:     [] WNL  [x]  Wound present  Ischial Tuberosity:  [] WNL  [x]  Wound present  Trochanter/Hip:      [x] WNL  []  Wound present  Knees:                   [x] WNL  []  Wound present  Ankles:                   [x] WNL  []  Wound present  Heels:                    [x] WNL  []  Wound present  Other pressure areas:  [x]  Wound location abdomen with wound vac dressing changed today       Device related: []  Device name:         LDA completed if wound present: yes/no  Consult WOCN if necessary    Other skin related issues, ie tears, rash, etc, document in Integumentary flowsheet

## 2022-08-12 NOTE — Progress Notes (Addendum)
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 s/p bilateral radical nephrectomies and  cystectomy on 06/28/22  - complicated by small bowel enterotomy and blood loss   New onset afib - on amiodarone and anticoagulation  High grade urothelial bladder CA: s/p bilateral nephrectomies and cystectomy c/b mall bowel enterotomy and blood loss (~1L) on 06/28/22  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 - remains low  - on aranesp  Encephalopathy - persistent - episode of unresponsive last night   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   HTN - BP labile    Recommendations:   HD today, 1L UF, midodrine 10 mg prior to dialysis ordered  Plan for discharge to [facility] nursing facility - we will continue to follow her there     Case discussed with: ICU, HD RN    Debara Pickett, MD  [facility]  [phone] (office)  X [phone] ([facility] Spectra-Link)      Subjective:  MRI brain  - no new changes  On RA    Review of Systems:   Asleep     Physical Exam:     Vitals:    08/11/22 2000 08/12/22 0000 08/12/22 0400 08/12/22 0800   BP: 113/58 111/56 145/65    Pulse: 69 (!) 59 75    Resp: 21 18 18     Temp: 98.2 F (36.8 C) 97.4 F (36.3 C) 98.9 F (37.2 C) 98.3 F (36.8 C)   TempSrc: Axillary Axillary Axillary Axillary   SpO2: 95% 96% 93%    Weight:   83.4 kg (183 lb 13.8 oz)    Height:           Intake and Output Summary (Last 24 hours) at Date Time    Intake/Output Summary (Last 24 hours) at 08/12/2022 0858  Last data filed at 08/11/2022 2000  Gross per 24 hour    Intake 575 ml   Output 20 ml   Net 555 ml         General: asleep  Cardiovascular: regular rate and rhythm  Lungs: CTA B anteriorly without rhonchi or wheezing  Abdomen: soft, midline wound with clear dressing and VAC in place  Extremities: no edema, heel protectors in place  Access: R permacath     Meds:      Scheduled Meds: PRN Meds:    acetaminophen, 650 mg, Oral, 4 times per day   Or  acetaminophen, 650 mg, Oral, 4 times per day  amiodarone, 200 mg, per NG tube, Daily  apixaban, 2.5 mg, Oral, Q12H SCH  atorvastatin, 40 mg, Oral, QHS  b complex-vitamin c-folic acid, 1 tablet, Oral, Daily  darbepoetin alfa, 0.45 mcg/kg, Subcutaneous, Weekly  lidocaine, 20 mL, Intradermal, Once  pantoprazole, 40 mg, per NG tube, Q12H SCH  senna-docusate, 1 tablet, Oral, QHS  thiamine, 100 mg, per NG tube, Daily  umeclidinium-vilanterol, 1 puff, Inhalation, QAM          Continuous Infusions:  sodium chloride, , PRN  acetaminophen, 650 mg, Q4H PRN   Or  acetaminophen, 650 mg, Q4H PRN   Or  acetaminophen, 650 mg, Q4H PRN  albuterol-ipratropium, 3 mL, Q6H PRN  benzocaine, 1 spray, TID PRN  calcium carbonate, 500 mg, Q4H PRN  dextrose, 15 g of glucose, PRN   Or  dextrose, 12.5 g, PRN   Or  dextrose, 12.5 g, PRN   Or  glucagon (rDNA), 1 mg, PRN  diphenhydrAMINE, 25 mg, Q6H PRN  hydrALAZINE, 10 mg, Q3H PRN  labetalol, 10 mg, Q15 Min PRN  lidocaine 2% jelly, , Once PRN  naloxone, 0.4 mg, PRN  ondansetron, 4 mg, Q6H PRN  phenol, 1 spray, Q2H PRN  polyethylene glycol, 17 g, Daily PRN  sodium chloride, 100 mL, Q1H PRN  sodium chloride, 250 mL, PRN  traMADol, 50 mg, Q8H PRN              Labs:     Recent Labs   Lab 08/12/22  0521 08/11/22  0211 08/10/22  2236   WBC 11.02* 12.69* 11.15*   Hemoglobin 7.2* 7.7* 8.0*   Hematocrit 23.2* 24.5* 25.2*   Platelet Count 288 272 292     Recent Labs   Lab 08/12/22  0521 08/11/22  0212 08/10/22  2236   Sodium 143 143 143   Potassium 3.8 4.2 4.1   Chloride 105 105 105   CO2 23 23 24    BUN 32* 23*  22*   Creatinine 4.7* 3.7* 3.6*   Calcium 8.7 8.3 8.1   Albumin 1.4* 1.4* 1.5*   Phosphorus 4.9* 3.9  --    Magnesium 1.8 1.9  --    Glucose 91 110* 126*   GFR 8.6* 11.5* 11.9*           Signed by: Debara Pickett, MD

## 2022-08-12 NOTE — Progress Notes (Signed)
Infectious Diseases & Tropical Medicine  Progress Note    08/12/2022   Shelby Barron VWU:[id number] is a 82 y.o. female,       Assessment:     Encephalopathy  Acute CVA  History of bladder cancer and renal failure  Status post radical cystectomy/bilateral nephrectomy, enterectomy and lysis of adhesion (06/28/2022)  S/p small bowel resection  Abdominal wall wound s/p bedside debridement/wound VAC placement (08/02/2022)  CT scan of abdomen and pelvis no acute findings (08/11/2022)  End-stage renal disease on hemodialysis  Atrial fibrillation  Mild leukocytosis-likely reactive  No fever or leukocytosis  Risk of aspiration  Vigilance for C. difficile    Plan:     Continue monitoring without antibiotics  Continue local wound care (reviewed along with wound care nurse)  Follow-up chest x-ray  Continue probiotics  Monitor electrolytes and renal functions closely  Monitor clinically  Discussed with nursing staff    ROS:     General: No fever, awake and alert,  Respiratory: No cough or shortness of breath  Gastrointestinal: Soft stools, abdominal wall wound VAC in place  Genito-Urinary: no hematuria  Musculoskeletal: no edema  Neurological: awake  Dermatological: No rash    Physical Examination:     Blood pressure 145/65, pulse 75, temperature 98.3 F (36.8 C), temperature source Axillary, resp. rate 18, height 1.575 m (5\' 2" ), weight 83.4 kg (183 lb 13.8 oz), SpO2 93%.    General Appearance: Comfortable  HEENT: Pupils are equal, round, and reactive to light.   Lungs: Clear to auscultation  Heart: Regular rate and rhythm  Chest: Symmetric chest wall expansion.   Abdomen: soft, no hepatosplenomegaly, no bowel sounds, abdominal wall wound VAC in place  Neurological: awake  Extremities: No edema    Laboratory And Diagnostic Studies:     Recent Labs     08/12/22  0521 08/11/22  0211   WBC 11.02* 12.69*   Hemoglobin 7.2* 7.7*   Hematocrit 23.2* 24.5*   Platelet Count 288 272     Recent Labs     08/12/22  0521  08/11/22  0212   Sodium 143 143   Potassium 3.8 4.2   Chloride 105 105   CO2 23 23   BUN 32* 23*   Creatinine 4.7* 3.7*   Glucose 91 110*   Calcium 8.7 8.3     Recent Labs     08/12/22  0521 08/11/22  0212 08/10/22  2236 08/10/22  0430   AST (SGOT)  --   --  11 11   ALT  --   --  8 10   Alkaline Phosphatase  --   --  85 91   Protein, Total  --   --  4.6* 4.9*   Albumin 1.4* 1.4* 1.5* 1.5*         Current Meds:      Scheduled Meds: PRN Meds:    acetaminophen, 650 mg, Oral, 4 times per day   Or  acetaminophen, 650 mg, Oral, 4 times per day  amiodarone, 200 mg, per NG tube, Daily  apixaban, 2.5 mg, Oral, Q12H SCH  atorvastatin, 40 mg, Oral, QHS  b complex-vitamin c-folic acid, 1 tablet, Oral, Daily  darbepoetin alfa, 0.45 mcg/kg, Subcutaneous, Weekly  lidocaine, 20 mL, Intradermal, Once  pantoprazole, 40 mg, per NG tube, Q12H SCH  senna-docusate, 1 tablet, Oral, QHS  thiamine, 100 mg, per NG tube, Daily  umeclidinium-vilanterol, 1 puff, Inhalation, QAM        Continuous Infusions:  sodium chloride, , PRN  acetaminophen, 650 mg, Q4H PRN   Or  acetaminophen, 650 mg, Q4H PRN   Or  acetaminophen, 650 mg, Q4H PRN  albuterol-ipratropium, 3 mL, Q6H PRN  benzocaine, 1 spray, TID PRN  calcium carbonate, 500 mg, Q4H PRN  dextrose, 15 g of glucose, PRN   Or  dextrose, 12.5 g, PRN   Or  dextrose, 12.5 g, PRN   Or  glucagon (rDNA), 1 mg, PRN  diphenhydrAMINE, 25 mg, Q6H PRN  hydrALAZINE, 10 mg, Q3H PRN  labetalol, 10 mg, Q15 Min PRN  lidocaine 2% jelly, , Once PRN  naloxone, 0.4 mg, PRN  ondansetron, 4 mg, Q6H PRN  phenol, 1 spray, Q2H PRN  polyethylene glycol, 17 g, Daily PRN  traMADol, 50 mg, Q8H PRN          Camryn Lampson A. Janalyn Rouse, M.D.  08/12/2022  8:08 AM

## 2022-08-12 NOTE — Progress Notes (Signed)
Nutritional Support Services  Nutrition Follow-up    Shelby Barron 82 y.o. female   MRN: [id number]    Summary of Nutrition Recommendations:    Palliative care to see patient today - monitor GOC  Continue current diet per SLP reccs  1:1 assistance during meal times  Magic Cup BID  Each Magic Cup provides an additional 290 kcal and 9 gm protein.  Ensure plus HP TID   - ONS provides 350 kcal, 20 g protein per serving   Daily weights  Consider appetite stimulant - messaged MD     -----------------------------------------------------------------------------------------------------------------    D/w MD, RN, palliative care                                                       ASSESSMENT DATA     Subjective Nutrition: Pt met at bedside, Aox1. Observed 25% of breakfast eaten (mostly eggs and grits). Wound care also at bedside. Pt poor historian, could not recall intake hx. From chart, oral intake has been mostly 25% of pureed meals for >1 week. New signs of muscle and fat loss noted in addition to significant weight loss. Patient now meets criteria for severe malnutrition.     Pt was receiving NGT feedings prior on in admission, PEG tube was brought up with daughter at that point however pt became more awake and started eating so NGT was removed. Pt was transferred to ICU again for hypotension following HD, likely related to poor oral intake among others. Discussed with Dr. Minus Liberty yesterday about possibility of PEG again - patient's albumin is very low in addition to wound on abdomen, likely not a candidate for PEG. Palliative care to meet with family again today, per RN note daughter is interested in meeting with them now. Will adjust ONS to see if patient will take Ensure vs Nepro. No overt GI issues. May also consider appetite stimulant.       Medical Hx:  has a past medical history of Acid reflux, Asthma, well controlled, Cancer of kidney, Chronic lower back pain, Congestive heart disease, COPD (chronic obstructive  pulmonary disease), CVA (cerebral infarction) (01/12/2003), Diabetes, Diabetic nephropathy, Fibroids, Gout, H/O: gout, Hyperlipidemia, Hypertension, Neuropathy of hand, and SOB (shortness of breath).       Orders Placed This Encounter   Procedures    Adult diet Therapeutic/ Modified; Solid; Pureed (IDDSI level 4); Mildly thick (IDDSI level 2); Room service: No    Magic Cup Quantity: A. One; Frequency: BID (2 times a day) - with Lunch and Dinner    Nepro supplement Quantity: A. One; Flavor: Wild berry; Frequency: Daily with dinner Preference for Allyson Sabal if possible     Intake:     08/06/22 0923 08/06/22 1410 08/07/22 0806   Intake (mL)   Percent Meal Consumed (%) 50% 0% 25%      08/07/22 1408 08/07/22 1700 08/08/22 0923   Intake (mL)   Percent Meal Consumed (%) 25% 25% 75%      08/08/22 1302 08/09/22 0900 08/09/22 1320   Intake (mL)   Percent Meal Consumed (%) 75% 25% 0%   Percent Supplement Consumed (%) 50%  --   --       08/10/22 0924 08/10/22 1435 08/11/22 1500   Intake (mL)   Percent Meal Consumed (%) 50% 25% 25%  08/11/22 1758 08/11/22 1800 08/11/22 2000   Percent Supplement Consumed (%) 25% 25% 25%       ANTHROPOMETRIC  Height: 157.5 cm (5\' 2" )  Weight: 83.4 kg (183 lb 13.8 oz)  Body mass index is 33.63 kg/m.     Weight History Summary: 13% weight loss in 1.5 months (acute, severe) ; could be fluid related however noted new signs of muscle and fat wasting cannot rule out LBM loss     Weight Monitoring     Weight Weight Method   06/30/2022 96 kg  Bed Scale    07/01/2022 96.7 kg  Bed Scale    07/02/2022 97.6 kg  Bed Scale    07/03/2022 93 kg  Bed Scale    07/04/2022 87.8 kg  Bed Scale    07/05/2022 87.8 kg  Bed Scale    07/06/2022 93.6 kg  Bed Scale    07/07/2022 90.4 kg  Bed Scale     90.4 kg     07/08/2022 93.2 kg  Bed Scale    07/09/2022 93.1 kg  Bed Scale    07/10/2022 93.2 kg  Bed Scale     88.3 kg  Bed Scale    07/11/2022 88.5 kg  Bed Scale    07/12/2022 87.8 kg  Bed Scale    07/13/2022 87.5 kg  Bed Scale    07/14/2022  84 kg     07/15/2022 88.3 kg  Bed Scale    07/16/2022 86.5 kg  Bed Scale    07/17/2022 96.9 kg  Bed Scale    07/18/2022 101 kg  Bed Scale     87.5 kg     07/19/2022 86.7 kg  Bed Scale    07/20/2022 90.4 kg  Bed Scale    07/21/2022 90.4 kg  Bed Scale    07/22/2022 90.4 kg  Bed Scale    07/23/2022 90.4 kg  Bed Scale    07/24/2022 87.5 kg  Bed Scale    07/25/2022 86.9 kg  Bed Scale    07/26/2022 86.9 kg  Bed Scale    07/27/2022 95.2 kg  Bed Scale    07/28/2022 90.5 kg  Bed Scale    07/29/2022 97.387 kg  Bed Scale    07/30/2022 89.6 kg  Bed Scale    07/31/2022 87.317 kg     08/01/2022 85.6 kg     08/02/2022 89.3 kg     08/03/2022 90.1 kg  Bed Scale     90.102 kg     08/04/2022 89.4 kg  Bed Scale    08/05/2022 82.5 kg  Bed Scale    08/06/2022 83.099 kg  Bed Scale    08/07/2022 85.7 kg  Bed Scale    08/08/2022 86 kg  Bed Scale    08/09/2022 84.6 kg     08/10/2022 85.9 kg     08/11/2022 79.7 kg  Bed Scale    08/12/2022 83.4 kg  Bed Scale        ESTIMATED NEEDS    Total Daily Energy Needs: 2125 to 2550 kcal  Method for Calculating Energy Needs: 25 kcal - 30 kcal per kg  at 85 kg (Actual body weight)  Rationale: non icu, HD, wound       Total Daily Protein Needs: 99.792 to 124.74 g  Method for Calculating Protein Needs: 2 g - 2.5 g per kg at 49.896 kg (Ideal body weight)  Rationale: non icu, HD, wound      Total Daily Fluid Needs: 1247.4 to  1496.88 ml  Method for Calculating Fluid Needs: 25 ml - 30 ml  per kg at 49.896 kg (Ideal body weight)  Rationale: BMI, wound; or per team    Pertinent Medications:   Nephrovite, lipitor, PPI, pericolace, thiamine  IVF:  Infusion Meds[1]    Pertinent labs:  Recent Labs   Lab 08/12/22  0521 08/11/22  0212 08/11/22  0211 08/10/22  2236 08/10/22  0430 08/08/22  0420   Sodium 143 143  --  143 141 142   Potassium 3.8 4.2  --  4.1 4.9 3.8   Chloride 105 105  --  105 100 103   CO2 23 23  --  24 25 27    BUN 32* 23*  --  22* 50* 23*   Creatinine 4.7* 3.7*  --  3.6* 6.2* 3.9*   Glucose 91 110*  --  126* 83 115*   Calcium 8.7 8.3  --   8.1 8.8 8.8   Magnesium 1.8 1.9  --   --   --   --    Phosphorus 4.9* 3.9  --   --   --   --    GFR 8.6* 11.5*  --  11.9* 6.2* 10.8*   WBC 11.02*  --  12.69* 11.15* 12.93* 12.97*   Hematocrit 23.2*  --  24.5* 25.2* 26.7* 27.9*   Hemoglobin 7.2*  --  7.7* 8.0* 8.3* 8.7*         Physical Assessment: 7/25 More severe findings compared to previous assessment   Head: temple region: slight depression with decrease in muscle tone/resistance (moderate muscle loss - temporalis), orbital region: slightly dark circles, somewhat hollow look, some decrease in bounce back of fat pads (moderate fat loss), and buccal region: slight depression, somewhat sunken appearance, flat cheeks, decrease in bounce back of fat pads (moderate fat loss)  Upper Body: clavicle bone region: some protrusion of the clavicle with decrease in muscle tone/resistance (mild muscle loss - pectoralis major), shoulder and acromion bone region: slight protrusion of acromion process, decrease in muscle tone/resistance (moderate muscle loss - deltoid), upper arm region: some fat in pinch between fingers, but not ample (moderate fat loss), dorsal hand region: slight depression, decrease in muscle tone/resistance (moderate muscle loss - interosseous), and scapular bone region: slight protrusion of the scapula bone, mild depressions, decrease in muscle tone/resistance around scapula bone (moderate muscle loss - trapezius, supraspinatus, infraspinatus)  Lower Body: anterior thigh and patellar region: slight depression along inner/outer thigh, patella slightly prominent, feel decrease in muscle tone/resistance in quadriceps to the patella (moderate muscle loss - quadriceps) and posterior calf region: absence of bulb shape, minimal to no muscle tone/resistance (severe muscle loss - gastrocnemius)  Edema: edema: no sign of fluid accumulation   Skin: abdominal wound w/ wound vac  GI function: WNL, LBM 7/24                                                    NUTRITION  DIAGNOSIS     Moderate Malnutrition related to inadequate nutritional intake in the setting of acute illness as evidenced by </=50% EER > 5 days, mild-moderate muscle losses (temporalis, prectoralis, deltoid, gastrocnemius), mild fat loss (buccal, triceps) - [place], see new    Severe malnutrition related to inadequate oral intake in the setting of acute illness as evidenced by PO intake meeting <  50% of needs for >5 days, moderate-severe muscle loss (temporalis, deltoid, interosseous, quadriceps, gastrocnemius, trapezius, supraspinatus, infraspinatus), moderate fat loss (orbital, buccal, triceps), and 13% weight loss in >1 month - new                                                           INTERVENTION     Nutrition recommendation - Please refer to top of note                                                       MONITORING/EVALUATION     1. Goal: Pt will consume >75% of meals and ONS by follow up - active     Nutrition Risk Level: High (will follow up at least 2 times per week and PRN)     Marisa Sprinkles, RD, CNSC  Dietitian Clinical Specialist I  [employer] [phone]       [1]   Current Facility-Administered Medications   Medication Dose Route Frequency Last Rate

## 2022-08-12 NOTE — PT Progress Note (Signed)
[facility]  Physical Therapy Attempt Note    Patient:  Shelby Barron MRN#:  [id number]  Unit:  [facility] Room/Bed:  N8295/A2130-86      Physical Therapy evaluation attempted on 08/12/2022 at 8:08 AM.         PT Order Cancellation Reason: Not clinically indicated at this time - Rehab decision (OT reporting patient is currently only appropriate for 1 dicsipline. PT will follow up when patient can tolerate both disciplines)        RN aware.    Nicoletta Dress, PT, DPT  08/12/2022  8:08 AM    Physical Therapist  Physical Medicine and Rehabilitation  [place], [place], Thurs 8-4:30pm  [place], Fri 12:30-9:00pm  [phone]

## 2022-08-12 NOTE — Progress Notes (Signed)
Arrived via bed. Post wound vac changed. Given [place] prior to coming. Pt alert, pleasently confused. Lungs clear. No sob noted. On RA. Telemetry SR. Dressing to right permcath dry and intact. No c/o pain. Report received from [employer] RN   08/12/22 1142   Dialysis Weight   Pre-Treatment Weight (Kg) 88.3   Scale Type ICU Bed Scale   Vitals   Temp 97.2 F (36.2 C)   Heart Rate 75   Resp Rate 22   BP 146/65   SpO2 97 %   O2 Device None (Room air)   Assessment   Mental Status Alert   Cardiac (WDL) X   Cardiac Regularity Irregular   Cardiac Symptoms Return to [facility]   Cardiac Rhythm Normal sinus rhythm   Respiratory  WDL   Respiratory Pattern Return to WDL   Bilateral Breath Sounds Diminished   R Breath Sounds Diminished   L Breath Sounds Diminished   Edema  X   RLE Edema +1   LLE Edema +1   General Skin Color Appropriate for ethnicity   Skin Temp Warm   Gastrointestinal (WDL) X   Abdomen Inspection Distended   GI Conditions Return to [facility]   Mobility Bed   Permacath Catheter - Tunneled 07/14/22 Subclavian vein catheter Right   Placement Date/Time: 07/14/22 1404   Inserted by: Dr. Welton Flakes  Access Type: Subclavian vein catheter  Orientation: Right  Central Line Infection Prevention Education provided?: Yes  Hand Hygiene: Alcohol based hand scrub;Soap and water  Line cart u...   Line necessity reviewed? Apheresis/hemodialysis   Site Assessment Clean;Dry;Intact   Catheter Lumen Volume Venous 1.6 mL   Catheter Lumen Volume Arterial 1.6 mL   Dressing Status and Intervention Dressing Intact   Tego/Curos Caps on Catheter Yes   Pain Assessment   Charting Type Assessment   Pain Scale Used Wong-Baker FACES   Numeric Pain Scale   Pain Score 0   POSS Score 1   Hemodialysis Comments   Pre-Hemodialysis Comments time out done

## 2022-08-12 NOTE — Treatment Plan (Cosign Needed)
4 eyes in 4 hours pressure injury assessment note:      Completed with: Lenor  Unit & Time admitted:              Bony Prominences: Check appropriate box; if wound is present enter wound assessment in LDA     Occiput:                 [x] WNL  []  Wound present  Face:                     [x] WNL  []  Wound present  Ears:                      [x] WNL  []  Wound present  Spine:                    [x] WNL  []  Wound present  Shoulders:             [x] WNL  []  Wound present  Elbows:                  [x] WNL  []  Wound present  Sacrum/coccyx:     [] WNL  [x]  Wound present  Ischial Tuberosity:  [] WNL  [x]  Wound present  Trochanter/Hip:      [x] WNL  []  Wound present  Knees:                   [x] WNL  []  Wound present  Ankles:                   [x] WNL  []  Wound present  Heels:                    [x] WNL  []  Wound present  Other pressure areas:  []  Wound location       Device related: []  Device name:         LDA completed if wound present: yes  Consult WOCN if necessary yes    Other skin related issues, ie tears, rash, etc, document in Integumentary flowsheet

## 2022-08-12 NOTE — Plan of Care (Signed)
HD done for 3.5 hours episode of hypotension in HD given fluid .     Problem: Moderate/High Fall Risk Score >5  Goal: Patient will remain free of falls  Flowsheets (Taken 08/12/2022 0800)  High (Greater than 13):   HIGH-Visual cue at entrance to patient's room   HIGH-Bed alarm on at all times while patient in bed   HIGH-Utilize chair pad alarm for patient while in the chair   HIGH-Apply yellow "Fall Risk" arm band   HIGH-Pharmacy to initiate evaluation and intervention per protocol   HIGH-Initiate use of floor mats as appropriate   HIGH-Consider use of low bed     Problem: Compromised Sensory Perception  Goal: Sensory Perception Interventions  Flowsheets (Taken 08/12/2022 0800)  Sensory Perception Interventions: Offload heels, Pad bony prominences, Reposition q 2hrs/turn Clock, Q2 hour skin assessment under devices if present     Problem: Compromised Moisture  Goal: Moisture level Interventions  Flowsheets (Taken 08/12/2022 0800)  Moisture level Interventions: Moisture wicking products, Moisture barrier cream     Problem: Compromised Activity/Mobility  Goal: Activity/Mobility Interventions  Flowsheets (Taken 08/12/2022 0800)  Activity/Mobility Interventions: Pad bony prominences, TAP Seated positioning system when OOB, Promote PMP, Reposition q 2 hrs / turn clock, Offload heels     Problem: Compromised Nutrition  Goal: Nutrition Interventions  Flowsheets (Taken 08/12/2022 0800)  Nutrition Interventions: Discuss nutrition at Rounds, I&Os, Document % meal eaten, Daily weights     Problem: Compromised Friction/Shear  Goal: Friction and Shear Interventions  Flowsheets (Taken 08/12/2022 0800)  Friction and Shear Interventions: Pad bony prominences, Off load heels, HOB 30 degrees or less unless contraindicated, Consider: TAP seated positioning, Heel foams     Problem: Inadequate Airway Clearance  Goal: Patent Airway maintained  Flowsheets (Taken 08/12/2022 1848)  Patent airway maintained:   Position patient for  maximum ventilatory efficiency   Suction secretions as needed   Reposition patient every 2 hours and as needed unless able to self-reposition  Goal: Normal respiratory rate/effort achieved/maintained  Flowsheets (Taken 08/12/2022 1848)  Normal respiratory rate/effort achieved/maintained: Plan activities to conserve energy: plan rest periods     Problem: Infection Prevention  Goal: Free from infection  Flowsheets (Taken 08/12/2022 1848)  Free from infection:   Monitor/assess vital signs   Encourage/assist patient to turn, cough and perform deep breathing every 2 hours   Assess incision for evidence of healing   Monitor/assess output from surgical drain if present   Monitor/assess lab values and report abnormal values   Assess for signs and symptoms of infection     Problem: Impaired Mobility  Goal: Mobility/Activity is maintained at optimal level for patient  Flowsheets (Taken 08/12/2022 1848)  Mobility/activity is maintained at optimal level for patient:   Encourage independent activity per ability   Consult/collaborate with Physical Therapy and/or Occupational Therapy     Problem: Safety  Goal: Patient will be free from injury during hospitalization  Flowsheets (Taken 08/12/2022 1848)  Patient will be free from injury during hospitalization:   Assess patient's risk for falls and implement fall prevention plan of care per policy   Provide and maintain safe environment   Use appropriate transfer methods   Ensure appropriate safety devices are available at the bedside   Include patient/ family/ care giver in decisions related to safety   Hourly rounding   Assess for patients risk for elopement and implement Elopement Risk Plan per policy   Provide alternative method of communication if needed ([employer], writing)  Problem: Hemodynamic Status: Cardiac  Goal: Stable vital signs and fluid balance  Flowsheets (Taken 08/12/2022 1848)  Stable vital signs and fluid balance:   Assess signs and symptoms associated  with cardiac rhythm changes   Monitor lab values     Problem: Renal Instability  Goal: Fluid and electrolyte balance are achieved/maintained  Flowsheets (Taken 08/12/2022 1848)  Fluid and electrolyte balance are achieved/maintained:   Monitor/assess lab values and report abnormal values   Assess and reassess fluid and electrolyte status   Observe for cardiac arrhythmias   Monitor for muscle weakness   Follow fluid restrictions/IV/PO parameters     Problem: Diabetes: Glucose Imbalance  Goal: Blood glucose stable at established goal  Flowsheets (Taken 08/12/2022 1848)  Blood glucose stable at established goal:   Monitor lab values   Monitor intake and output.  Notify LIP if urine output is < 30 mL/hour.   Follow fluid restrictions/IV/PO parameters   Include patient/family in decisions related to nutrition/dietary selections   Assess for hypoglycemia /hyperglycemia   Monitor/assess vital signs   Coordinate medication administration with meals, as indicated   Ensure appropriate diet and assess tolerance   Ensure adequate hydration   Ensure appropriate consults are obtained (Nutrition, Diabetes Education, and Case Management)   Ensure patient/family has adequate teaching materials     Problem: Inadequate Gas Exchange  Goal: Patent Airway maintained  Flowsheets (Taken 08/12/2022 1848)  Patent airway maintained:   Position patient for maximum ventilatory efficiency   Suction secretions as needed   Reposition patient every 2 hours and as needed unless able to self-reposition     Problem: Neurological Deficit  Goal: Neurological status is stable or improving  Flowsheets (Taken 08/12/2022 1848)  Neurological status is stable or improving:   Monitor/assess/document neurological assessment (Stroke: every 4 hours)   Monitor/assess NIH Stroke Scale   Re-assess NIH Stroke Scale for any change in status   Perform CAM Assessment   Observe for seizure activity and initiate seizure precautions if indicated     Problem: Every Day -  Stroke  Goal: Mobility/Activity is maintained at optimal level for patient  Flowsheets (Taken 08/12/2022 1848)  Mobility/activity is maintained at optimal level for patient:   Encourage independent activity per ability   Consult/collaborate with Physical Therapy and/or Occupational Therapy  Goal: Neurological status is stable or improving  Flowsheets (Taken 08/12/2022 1848)  Neurological status is stable or improving:   Monitor/assess/document neurological assessment (Stroke: every 4 hours)   Monitor/assess NIH Stroke Scale   Re-assess NIH Stroke Scale for any change in status   Perform CAM Assessment   Observe for seizure activity and initiate seizure precautions if indicated  Goal: Skin integrity is maintained or improved  Flowsheets (Taken 08/12/2022 1848)  Skin integrity is maintained or improved:   Assess Braden Scale every shift   Turn or reposition patient every 2 hours or as needed unless able to reposition self   Keep skin clean and dry   Avoid shearing   Encourage use of lotion/moisturizer on skin   Monitor patient's hygiene practices   Collaborate with Wound, Ostomy, and Continence Nurse   Increase activity as tolerated/progressive mobility   Relieve pressure to bony prominences   Utilize specialty bed   Keep head of bed 30 degrees or less (unless contraindicated)

## 2022-08-12 NOTE — Progress Notes (Signed)
Uf of only 644m. Bp soft this tx. Pt. Remains pleasently confused. Lungs diminished. Sat 98% on RA. Dressing to right permcath dry and intact. Telemetry SR with occasional PVC. Appears comfort able at present.report called to Shelby Kitten RN   08/12/22 1515   Treatment Summary   Time Off Machine 1529   Duration of Treatment (Hours) 3.5   Treatment Type 2:1   Dialyzer Clearance Heavily streaked   Fluid Volume Off (mL) 1111   Prime Volume (mL) 200   Rinseback Volume (mL) 200   Fluid Given: Normal Saline (mL) 100   Fluid Given: PRBC  0 mL   Fluid Given: Albumin (mL) 0   Fluid Given: Other (mL) 0   Total Fluid Given 500   Hemodialysis Net Fluid Removed 611   Post Treatment Assessment   Post-Treatment Weight (Kg) 87.8   Patient Response to Treatment TOLERATED FAIR. BP SOFT   Permacath Catheter - Tunneled 07/14/22 Subclavian vein catheter Right   Placement Date/Time: 07/14/22 1404   Inserted by: Dr. Welton Flakes  Access Type: Subclavian vein catheter  Orientation: Right  Central Line Infection Prevention Education provided?: Yes  Hand Hygiene: Alcohol based hand scrub;Soap and water  Line cart u...   Line necessity reviewed? Apheresis/hemodialysis   Site Assessment Clean;Dry;Intact   Catheter Lumen Volume Venous 1.6 mL   Catheter Lumen Volume Arterial 1.6 mL   Dressing Status and Intervention Dressing Intact;Clean & Dry   Tego/Curos Caps on Catheter Yes   NEW Tego/Curos Caps placed (Date) 08/12/22   End Caps Free From Blood Yes   Line Care Connections checked and tightened   Dressing Type Transparent;Biopatch   Dressing Status Clean;Dry;Intact   Dressing/Line Intervention Caps changed   Vitals   Temp 97.4 F (36.3 C)   Heart Rate 81   Resp Rate 18   BP 128/59   SpO2 98 %   O2 Device None (Room air)   Assessment   Mental Status Alert;Restless;Disoriented   Cardiac (WDL) X   Cardiac Regularity Irregular   Cardiac Symptoms Return to [facility]   Cardiac Rhythm Normal sinus rhythm   Ectopy Premature ventricular contractions (PVCs)    Ectopy Frequency Occasional   Respiratory  WDL   Respiratory Pattern Return to WDL   Bilateral Breath Sounds Diminished   R Breath Sounds Diminished   L Breath Sounds Diminished   Edema  X   RLE Edema +1   LLE Edema +1   General Skin Color Appropriate for ethnicity   Skin Temp Warm   Gastrointestinal (WDL) X   Abdomen Inspection Distended   GI Conditions Return to [facility]   Mobility Bed   Pain Assessment   Charting Type Reassessment   Pain Scale Used Wong-Baker FACES   Numeric Pain Scale   Pain Score 1   POSS Score 1   Pain Location Generalized   Pain Descriptors Discomfort   Pain Frequency Intermittent   Education   Person taught Patient   Knowledge basis None   Topics taught Procedure   Teaching Tools Explain   Reponse No Evidence of Learning   Bedside Nurse Communication   Name of bedside RN - post dialysis Shelby Kitten RN

## 2022-08-12 NOTE — Plan of Care (Signed)
Problem: Renal Instability  Goal: Fluid and electrolyte balance are achieved/maintained  Flowsheets (Taken 08/11/2022 1024 by Julien Girt, RN)  Fluid and electrolyte balance are achieved/maintained:   Monitor for muscle weakness   Monitor/assess lab values and report abnormal values   Assess and reassess fluid and electrolyte status   Observe for cardiac arrhythmias     Problem: Patient Receiving Advanced Renal Therapies  Goal: Therapy access site remains intact  Flowsheets (Taken 08/08/2022 1803 by Wess Botts, RN)  Therapy access site remains intact:   Assess therapy access site   Change therapy access site dressing as needed

## 2022-08-12 NOTE — Malnutrition Assessment (Signed)
Shelby Barron is a 82 y.o. female patient.   [id number]    Malnutrition Assessment   Malnutrition Documentation    Severe malnutrition related to inadequate oral intake in the setting of acute illness as evidenced by PO intake meeting <50% of needs for >5 days, moderate-severe muscle loss (temporalis, deltoid, interosseous, quadriceps, gastrocnemius, trapezius, supraspinatus, infraspinatus), moderate fat loss (orbital, buccal, triceps), and 13% weight loss in >1 month        Marisa Sprinkles, RDN      If physician disagrees with this assessment see addendum.

## 2022-08-12 NOTE — Consults (Signed)
Consult received for "arrange outpatient palliative."  Referral sent to [facility] for Palliative services.     Marlan Palau, MSW, ACM-SW  Social Worker Case Manager II  [facility]

## 2022-08-12 NOTE — Progress Notes (Signed)
[facility] Palliative Medicine & Comprehensive Care  Service Phone Number: Mackie Pai: [phone]  Mon-Fri 9a-4p     Palliative Care Follow Up   Date Time: 08/12/22 3:42 PM   Patient Name: Shelby Barron, Shelby Barron   Location: [id number]/Q5956-38   Attending Physician: Donzetta Sprung, MD   Consulting Provider: Hermelinda Dellen, MD  Consulting Service: Palliative Medicine and Comprehensive Care  Consulted request from Donzetta Sprung, MD to see patient regarding:   Reason for Referral: Clarify goals of care; Advance care planning; Psychosocial or spiritual support        Assessment & Plan   Impression      Shelby Barron is a 82 y.o. female with significant PMH of [name] TTS, CVA (2004), HFpEF, RCC s/p partial b/l nephrectomy, and recently diagnosed urothelial cancer. Pt was admitted to [facility] on 06/28/2022 for elective cystectomy and completion nephrectomy with surgery complicated by intraoperative enterotomy resulting in hemorrhagic shock with abdomen left in discontinuity who is now s/p abdominal anastomosis and abdominal wall closure (6/12), with course further complicated by acute metabolic encephalopathy, new atrial fibrillation with RVR, GNR pneumonia, and dysphagia. Palliative medicine initially consulted 6/19, signed off as goals were clear for continued restorative care with no limits. Palliative medicine re-engaged 7/1 for goals and, again, PC signed off as goals remained for full restorative care and daughter did not want to speak further with the team. We have been re-consulted again 7/25 per daughter request.     7/25: Re-consulted per daughter request. Called daughter and discussed case. Daughter has multiple questions regarding "the role of Palliative care within the healthcare system". Daughter had multiple questions regarding Palliative Care vs hospice care, she had multiple questions regarding restorative care vs comfort care, she had multiple questions regarding inpatient vs outpatient Palliative Care  services. All of the daughter's questions surrounding Palliative Care services and resources and our role in the setting of critical and terminal illness were answered. Daughter says family goals including hers as well as the patient's spouses goals for the patient are for full restorative care and any and all measures for increased quantity of life. She agrees to outpatient Palliative Care referral. Patient is high risk for repeat hospitalizations. Should inpatient Palliative Care need to be re-consulted in the future, recommend checking with daughter/husband that they are OK to speak with Palliative Care again prior to consultation. Outpatient Palliative Care referral placed. Our team remains available to re-engage goals discussions as needed, we will sign off for now. Discussed with hospitalist and CM.     Estimated Prognosis: Unclear     Recommendations   1. Goals of Care:       # Capacity:       - Patient does not have capacity for medical decision making.       # Conversation:  7/25: Re-consulted per daughter request. Called daughter and discussed case. Daughter has multiple questions regarding "the role of Palliative care within the healthcare system". Daughter had multiple questions regarding Palliative Care vs hospice care, she had multiple questions regarding restorative care vs comfort care, she had multiple questions regarding inpatient vs outpatient Palliative Care services. All of the daughter's questions surrounding Palliative Care services and resources and our role in the setting of critical and terminal illness were answered. Daughter says family goals including hers as well as the patient's spouses goals for the patient are for full restorative care and any and all measures for increased quantity of life. She agrees to outpatient Palliative Care referral.  Patient is high risk for repeat hospitalizations. Should inpatient Palliative Care need to be re-consulted in the future, recommend checking  with daughter/husband that they are OK to speak with Palliative Care again prior to consultation. Outpatient Palliative Care referral placed. Our team remains available to re-engage goals discussions as needed, we will sign off for now. Discussed with hospitalist and CM.          - 7/1: Daughter recalls speaking with Dr. Phineas Semen earlier in admission. Daughter reports that their goals have not changed regarding code status or restorative care. Daughter was not informed that palliative medicine was re-consulted to discuss goals of care, nothing that "goals have not changed since we last spoke with your colleague." She is not interested in speaking with palliative team (she also speaks on behalf of her dad/patient's mPOA/husband). Encouraged daughter to reach out and request palliative consult if she and her father ever wish to address patient's GOC/quality of life.        - 6/19: Family meeting held with husband, daughter, and niece (family physician). Medical update provided, reviewed hospitalization, reviewed chronic conditions including [name], her cardiac history, and her oncological history leading to elective surgeries resulting in complications requiring ICU level of care. Family is clear they would like no limits to care and are OK with CPR and re-intubation in a code event as needed. Discussed one option could be for continued optimization towards comfort/hospice on discharge whenever medically cleared. Family defers any comfort/hospice approach to care on discharge as they are hopeful patient will be able to regain strength via PT/OT/rehab.  Goals are for continued full restorative care with goals for SNF/rehab vs home with home health, for follow ups with outpatient specialists, and to continue FULL CODE resuscitation status, to set NO LIMITS to care for now.       # Code Status:       - Full Code      # Advance Directive/Living Will:       - Medical Decision Making Capacity:  no       - ACP Validation:  Patient unable to complete       - Medical Decision Maker: husband, Joaquim Lai. Daughter, Ty Oshima assists with medical decision making. No MPOA paperwork.        - ACP Document: None      # Palliative Care:       - Introduced the Palliative Medicine team available here at [facility]. Reviewed that team assists in guiding communication between patients, their families, and the different providers involved in a patient's care to ensure that everyone is on the same page and working toward the same goals.     2. Symptom Management:      # Risk for Delirium:       - Delirium Precautions:           * Frequent reorientation by staff and maintaining appropriate day-night cycles          * Maximize visibility of environmental cues such as clocks, calendars          * Encourage communication and visits with family members and friends          * Allow exposure to natural light during the day, opening curtains to window          * Encourage day time activities, OOB and keeping pt active during day if possible           * Minimize  disturbances and reduce light exposure and TV in the evening/night           * Limit non essential care such as blood draws, imaging, etc          * Group blood draws together to limit frequency of needle sticks           * Optimize nutrition, bladder/bowel regimen. Constipation, urinary retention, dehydration may worsen delirium. Rule out infection such as UTI or PNA which can predispose patients to delirium.          * Minimize medications that may cause/exacerbate delirium: opiates, anticholinergics, antihistamines, benzodiazepines, and other sedatives    3. Psychosocial:       # Support:      - Family support from husband Randolph, daughter [name] and niece Alona Bene.     4. Spiritual:        # [employer]     5. PC Team follow-up plans: PC to sign off       Discharge Disposition: TBD, likely SNF      Outpatient Follow Up Recommended: yes, PC outpatient referral placed      Outcomes:  Clarified goals of care    52 minutes. Total time on unit today in care of [name] including chart review, face to face evaluation, management, counseling and coordination of care.     Discussed case/recommendations with:   - Hospitalist and CM      Hermelinda Dellen, MD  Palliative Care  [place]: [phone], M-F 9a-4p     Subjective/Interval History     7/25: Patient seen and examined. She tells [place] her name and birthday, she does not answer all questions appropriately, she remains confused, she is staring off to one side.     Called daughter, she had multiple questions regarding Palliative Care and hospice. All her questions were answered, her and family goals for patient remain for full restorative care.        Palliative Functional and Symptom Assessment     Palliative Performance Scale: 30% - Totally bed bound, unable to do any work, extensive disease, total care, reduced intake, full LOC, drowsy or confusion       Medications   Scheduled Meds  Current Facility-Administered Medications   Medication Dose Route Frequency    acetaminophen  650 mg Oral 4 times per day    Or    acetaminophen  650 mg Oral 4 times per day    amiodarone  200 mg per NG tube Daily    apixaban  2.5 mg Oral Q12H SCH    atorvastatin  40 mg Oral QHS    b complex-vitamin c-folic acid  1 tablet Oral Daily    darbepoetin alfa  0.45 mcg/kg Subcutaneous Weekly    lidocaine  20 mL Intradermal Once    midodrine  10 mg Oral Q Tue/Thu/Sat (AM)    pantoprazole  40 mg per NG tube Q12H [facility]    senna-docusate  1 tablet Oral QHS    thiamine  100 mg per NG tube Daily    umeclidinium-vilanterol  1 puff Inhalation QAM      DRIPS     PRN MEDS  Current Facility-Administered Medications   Medication Dose    sodium chloride      acetaminophen  650 mg    Or    acetaminophen  650 mg    Or    acetaminophen  650 mg    albuterol-ipratropium  3 mL    benzocaine  1 spray    calcium carbonate  500 mg    dextrose  15 g of glucose    Or    dextrose  12.5 g    Or     dextrose  12.5 g    Or    glucagon (rDNA)  1 mg    diphenhydrAMINE  25 mg    hydrALAZINE  10 mg    labetalol  10 mg    lidocaine 2% jelly      naloxone  0.4 mg    ondansetron  4 mg    phenol  1 spray    polyethylene glycol  17 g    traMADol  50 mg       Allergies   Allergies   Allergen Reactions    Metrizamide Shortness Of Breath and Nausea Only    Rosuvastatin Other (See Comments)     myopathy    myopathy   myopathy    myopathy myopathy      myopathy    myopathy, myopathy    Iodinated Contrast Media Other (See Comments)     Nausea and SOB per patient   On Dialysisi    Nausea and SOB per patient    Nausea and SOB per patient      Nausea and SOB per patient   Nausea and SOB per patient    Nausea and SOB per patient      Nausea and SOB per patient Nausea and SOB per patient      Nausea and SOB per patient Nausea and SOB per patient Nausea and SOB per patient      Nausea and SOB per patient  Nausea and SOB per patient Nausea and SOB per patient    Nausea and SOB per patient   Nausea and SOB per patient    Pioglitazone      Weight gain    Sulfa Antibiotics Hives     blister       Physical Exam   Vital Signs:  BP 128/59   Pulse 81   Temp 97.4 F (36.3 C)   Resp 18   Ht 1.575 m (5\' 2" )   Wt 83.4 kg (183 lb 13.8 oz)   SpO2 98%   BMI 33.63 kg/m     Intake/Output:    Intake/Output Summary (Last 24 hours) at 08/12/2022 1542  Last data filed at 08/12/2022 1515  Gross per 24 hour   Intake 325 ml   Output 861 ml   Net -536 ml       Physical Exam:  Physical Exam  Constitutional:       General: She is not in acute distress.     Appearance: She is ill-appearing.   HENT:      Head:      Comments: Bitemporal wasting     Mouth/Throat:      Mouth: Mucous membranes are dry.   Eyes:      Conjunctiva/sclera: Conjunctivae normal.   Cardiovascular:      Rate and Rhythm: Normal rate.   Pulmonary:      Effort: Pulmonary effort is normal. No respiratory distress.      Breath sounds: No stridor.   Skin:     General: Skin is warm and dry.    Neurological:      Mental Status: She is disoriented.            Labs / Radiology   Lab and diagnostics: reviewed in Epic  Recent Labs  Lab 08/12/22  0521   WBC 11.02*   Hemoglobin 7.2*   Hematocrit 23.2*   Platelet Count 288                Recent Labs   Lab 08/12/22  0521   Sodium 143   Potassium 3.8   Chloride 105   CO2 23   BUN 32*   Creatinine 4.7*   GFR 8.6*   Glucose 91   Calcium 8.7     Recent Labs   Lab 08/12/22  0521 08/11/22  0212 08/10/22  2236   Bilirubin, Total  --   --  0.2   Protein, Total  --   --  4.6*   Albumin 1.4*  More results in Results Review 1.5*   ALT  --   --  8   AST (SGOT)  --   --  11   More results in Results Review = values in this interval not displayed.          CT Abdomen Pelvis WO IV/ WO PO Cont    Result Date: 08/11/2022  1. Colonic diverticulosis. No findings to suggest acute diverticulitis or colitis. 2. Small ascites and retroperitoneal free fluid, increased. 3. Nonspecific gallbladder distention. 4. Hiatal hernia. 5. Open midline wound. 6. Left flank hernia containing ascites. Kennyth Lose, MD 08/11/2022 4:23 PM    MRI Brain WO Contrast    Result Date: 08/11/2022  Motion degraded exam. No acute intracranial abnormality. No new interval infarct. Leota Jacobsen, MD 08/11/2022 12:51 PM    XR Chest AP Portable    Result Date: 08/11/2022  Mild left basilar airspace opacities and small bilateral pleural effusions. Judd Gaudier, MD 08/11/2022 2:48 AM    CT Head WO Contrast    Result Date: 08/11/2022   Redemonstrated multiple areas of subacute infarction with cortical hyperdensities which favor petechial hemorrhage in the right frontal lobe and bilateral parietal lobes. These are similar to the prior examination on 07/26/2022. No evidence of new or progressive acute infarct. No mass effect. Vernie Murders, MD 08/11/2022 12:00 AM

## 2022-08-13 LAB — LAB USE ONLY - CBC WITH DIFFERENTIAL
Absolute Basophils: 0.07 10*3/uL (ref 0.00–0.08)
Absolute Eosinophils: 0.02 10*3/uL (ref 0.00–0.44)
Absolute Immature Granulocytes: 0.23 10*3/uL — ABNORMAL HIGH (ref 0.00–0.07)
Absolute Lymphocytes: 3.18 10*3/uL (ref 0.42–3.22)
Absolute Monocytes: 1.69 10*3/uL — ABNORMAL HIGH (ref 0.21–0.85)
Absolute Neutrophils: 8.33 10*3/uL — ABNORMAL HIGH (ref 1.10–6.33)
Absolute nRBC: 0 10*3/uL (ref <=0.00)
Basophils %: 0.5 %
Eosinophils %: 0.1 %
Hematocrit: 23.1 % — ABNORMAL LOW (ref 34.7–43.7)
Hemoglobin: 7.3 g/dL — ABNORMAL LOW (ref 11.4–14.8)
Immature Granulocytes %: 1.7 %
Lymphocytes %: 23.5 %
MCH: 30 pg (ref 25.1–33.5)
MCHC: 31.6 g/dL (ref 31.5–35.8)
MCV: 95.1 fL (ref 78.0–96.0)
MPV: 9.4 fL (ref 8.9–12.5)
Monocytes %: 12.5 %
Neutrophils %: 61.7 %
Platelet Count: 363 10*3/uL — ABNORMAL HIGH (ref 142–346)
Preliminary Absolute Neutrophil Count: 8.33 10*3/uL — ABNORMAL HIGH (ref 1.10–6.33)
RBC: 2.43 10*6/uL — ABNORMAL LOW (ref 3.90–5.10)
RDW: 15 % (ref 11–15)
WBC: 13.52 10*3/uL — ABNORMAL HIGH (ref 3.10–9.50)
nRBC %: 0 /100 WBC (ref <=0.0)

## 2022-08-13 LAB — WHOLE BLOOD GLUCOSE POCT
Whole Blood Glucose POCT: 119 mg/dL — ABNORMAL HIGH (ref 70–100)
Whole Blood Glucose POCT: 153 mg/dL — ABNORMAL HIGH (ref 70–100)
Whole Blood Glucose POCT: 268 mg/dL — ABNORMAL HIGH (ref 70–100)

## 2022-08-13 LAB — RENAL FUNCTION PANEL
Albumin: 1.4 g/dL — ABNORMAL LOW (ref 3.5–5.0)
Anion Gap: 11 (ref 5.0–15.0)
BUN: 15 mg/dL (ref 7–21)
CO2: 28 mEq/L (ref 17–29)
Calcium: 8.2 mg/dL (ref 7.9–10.2)
Chloride: 103 mEq/L (ref 99–111)
Creatinine: 2.9 mg/dL — ABNORMAL HIGH (ref 0.4–1.0)
GFR: 15.4 mL/min/{1.73_m2} — ABNORMAL LOW (ref >=60.0)
Glucose: 96 mg/dL (ref 70–100)
Phosphorus: 3.3 mg/dL (ref 2.3–4.7)
Potassium: 3.7 mEq/L (ref 3.5–5.3)
Sodium: 142 mEq/L (ref 135–145)

## 2022-08-13 LAB — CALCIUM, IONIZED: Calcium, Ionized: 2.27 mEq/L (ref 2.23–2.56)

## 2022-08-13 LAB — MAGNESIUM: Magnesium: 1.7 mg/dL (ref 1.6–2.6)

## 2022-08-13 MED ORDER — POLYETHYLENE GLYCOL 3350 17 G PO PACK
17.0000 g | PACK | Freq: Every day | ORAL | Status: DC
Start: 2022-08-13 — End: 2022-08-18
  Administered 2022-08-13 – 2022-08-17 (×3): 17 g via ORAL
  Filled 2022-08-13 (×5): qty 1

## 2022-08-13 MED ORDER — MEGESTROL ACETATE 400 MG/10ML PO SUSP
400.0000 mg | Freq: Every day | ORAL | Status: DC
Start: 2022-08-13 — End: 2022-08-18
  Administered 2022-08-13 – 2022-08-18 (×6): 400 mg via ORAL
  Filled 2022-08-13 (×6): qty 10

## 2022-08-13 MED ORDER — POLYETHYLENE GLYCOL 3350 17 G PO PACK
17.0000 g | PACK | Freq: Every day | ORAL | 0 refills | Status: DC
Start: 2022-08-13 — End: 2022-09-05

## 2022-08-13 MED ORDER — MEGESTROL ACETATE 40 MG PO TABS
40.0000 mg | ORAL_TABLET | Freq: Four times a day (QID) | ORAL | 0 refills | Status: DC
Start: 2022-08-13 — End: 2022-09-03

## 2022-08-13 MED ORDER — MIDODRINE HCL 10 MG PO TABS
10.0000 mg | ORAL_TABLET | ORAL | 0 refills | Status: DC
Start: 2022-08-14 — End: 2022-09-05

## 2022-08-13 MED ORDER — MEGESTROL ACETATE 40 MG PO TABS
40.0000 mg | ORAL_TABLET | Freq: Four times a day (QID) | ORAL | Status: DC
Start: 2022-08-13 — End: 2022-08-13

## 2022-08-13 MED ORDER — TRAMADOL HCL 50 MG PO TABS
50.0000 mg | ORAL_TABLET | Freq: Three times a day (TID) | ORAL | 0 refills | Status: AC | PRN
Start: 2022-08-13 — End: 2022-08-17

## 2022-08-13 NOTE — Progress Notes (Signed)
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 s/p bilateral radical nephrectomies and  cystectomy on 06/28/22  - complicated by small bowel enterotomy and blood loss   New onset afib - on amiodarone and anticoagulation  High grade urothelial bladder CA: s/p bilateral nephrectomies and cystectomy c/b mall bowel enterotomy and blood loss (~1L) on 06/28/22  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 - remains low  - on aranesp  Encephalopathy - persistent - episode of unresponsive last night   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   HTN - BP labile    Recommendations:   S/p HD yesterday with limited UF given soft BP  Plan for routien HD tomorrow, midodrine pre-dialysis - will order   Plan for discharge to [facility] nursing facility - we will continue to follow her there     Case discussed with: patient    Delice Bison, MD  [facility]  [phone] (office)  X [phone] ([facility] Spectra-Link)      Subjective: awake, alert   Review of Systems:   No chest pain, no SOB    Physical Exam:     Vitals:    08/13/22 0600 08/13/22 0800 08/13/22 1000 08/13/22 1200   BP: 141/63 128/62 127/59    Pulse: 85 78 80    Resp: 22 (!) 38 (!) 28    Temp:  99.8 F (37.7 C)  99.1 F (37.3 C)   TempSrc:  Axillary  Axillary   SpO2: 98% 94% 96%    Weight:       Height:           Intake and Output Summary (Last 24 hours) at Date Time    Intake/Output Summary (Last 24 hours) at 08/13/2022 1330  Last data filed at 08/13/2022 0800  Gross per 24 hour   Intake 180 ml    Output 611 ml   Net -431 ml         Genera: awake, alert   Cardiovascular: regular rate and rhythm  Lungs: CTA B anteriorly without rhonchi or wheezing  Abdomen: soft, midline wound with clear dressing and VAC in place  Extremities: no edema, heel protectors in place  Access: R permacath     Meds:      Scheduled Meds: PRN Meds:    acetaminophen, 650 mg, Oral, 4 times per day   Or  acetaminophen, 650 mg, Oral, 4 times per day  amiodarone, 200 mg, per NG tube, Daily  apixaban, 2.5 mg, Oral, Q12H SCH  atorvastatin, 40 mg, Oral, QHS  b complex-vitamin c-folic acid, 1 tablet, Oral, Daily  darbepoetin alfa, 0.45 mcg/kg, Subcutaneous, Weekly  lidocaine, 20 mL, Intradermal, Once  megestrol, 40 mg, Oral, QID  midodrine, 10 mg, Oral, Q Tue/Thu/Sat (AM)  pantoprazole, 40 mg, per NG tube, Q12H SCH  polyethylene glycol, 17 g, Oral, Daily  senna-docusate, 1 tablet, Oral, QHS  thiamine, 100 mg, per NG tube, Daily  umeclidinium-vilanterol, 1 puff, Inhalation, QAM  Continuous Infusions:         sodium chloride, , PRN  acetaminophen, 650 mg, Q4H PRN   Or  acetaminophen, 650 mg, Q4H PRN   Or  acetaminophen, 650 mg, Q4H PRN  albuterol-ipratropium, 3 mL, Q6H PRN  benzocaine, 1 spray, TID PRN  calcium carbonate, 500 mg, Q4H PRN  dextrose, 15 g of glucose, PRN   Or  dextrose, 12.5 g, PRN   Or  dextrose, 12.5 g, PRN   Or  glucagon (rDNA), 1 mg, PRN  diphenhydrAMINE, 25 mg, Q6H PRN  hydrALAZINE, 10 mg, Q3H PRN  labetalol, 10 mg, Q15 Min PRN  lidocaine 2% jelly, , Once PRN  naloxone, 0.4 mg, PRN  ondansetron, 4 mg, Q6H PRN  phenol, 1 spray, Q2H PRN  traMADol, 50 mg, Q8H PRN              Labs:     Recent Labs   Lab 08/13/22  0322 08/12/22  0521 08/11/22  0211   WBC 13.52* 11.02* 12.69*   Hemoglobin 7.3* 7.2* 7.7*   Hematocrit 23.1* 23.2* 24.5*   Platelet Count 363* 288 272     Recent Labs   Lab 08/13/22  0322 08/12/22  0521 08/11/22  0212   Sodium 142 143 143   Potassium 3.7 3.8 4.2   Chloride 103 105 105   CO2 28 23 23    BUN 15 32*  23*   Creatinine 2.9* 4.7* 3.7*   Calcium 8.2 8.7 8.3   Albumin 1.4* 1.4* 1.4*   Phosphorus 3.3 4.9* 3.9   Magnesium 1.7 1.8 1.9   Glucose 96 91 110*   GFR 15.4* 8.6* 11.5*           Signed by: Delice Bison, MD

## 2022-08-13 NOTE — Progress Notes (Signed)
[facility]  PROGRESS NOTE      Patient: Shelby Barron  Date: 08/13/2022   LOS: 46 Days  Admission Date: 06/28/2022   MRN: [id number]  Attending: Donzetta Sprung, MD  When on service as the attending, please contact me on Epic Secure Chat from 7 AM - 7 PM for non-urgent issues. For urgent matters use XTend page from 7 AM - 7 PM.       ASSESSMENT/PLAN     Shelby Barron is a 82 y.o. female admitted with Bladder cancer    Interval Summary:     Assessment and plan:     Hypotension  Encephalopathy  -Considering to be more vasovagal  -CT without contrast denies nasal congestion or sore throat for any acute findings apart from the abdominal wound from recent procedure  -Blood pressure stable, given midodrine prior to HD; next dose tomorrow and HD tomorrow  -Mentation is improved compared to previously alert and oriented x 2, stable  -Continue neurochecks every 4 hours     Acute toxic metabolic Encephalopathy/delirium stable-improved  Possible underlying cognitive impairment  - On and off confusion alert oriented x 2, now relatively stable the last several days  - Patient was upgraded to ICU on 6/30 for lethargy and decreased responsiveness. CT head was with no acute finding.   - EEG consistent with encephalopathy no seizure, CVA management as below  - SLP evaluated patient today, pulled back diet to full liquid with mildly thickened liquid.  NG tube removed 7/9.  - Palliative signed off, family was clear they would like no limits to care and patient remained full code.     Anemia of CKD and acute blood loss anemia  -Hemoglobin stable today, 7.3 from 7.2; no bleeding diathesis noted  -Will monitor in the morning, threshold for transfusion is below 7.0  - given Aranesp this admission per nephrology  - No signs of active bleeding.     S/p open cystectomy, bilateral nephrectomies, and resection of small bowel after intra-op enterotomy    Mixed shock - hemorrhagic vs septic: Resolved  Necrotic wound tissue  - 6/10 small  bowel enteretomy with 1L EBL. Went to the OR on June 12 for small bowel anastomosis, closure of mesenteric defect, closure of abdominal wall with wound VAC application. Wound VAC in place. Will need outpatient wound care follow up for ongoing management.  - Surgery consulted, CT abdomen with and without contrast did not show any anastomotic leak or abscess.   - Status post wound debridement by general surgery at bedside on 7/15 for necrotic wound tissue  - ID consulted, completed meropenem and Mycamine for 2 weeks on 7/3. No further abx recommended after bedside debridement on 7/15  Wound care team recommendation.    When patient discharged or VAC discontinued -   Remove all foam dressing and non adherent dressing and place hydrogel moistened gauze into the wound bed, cover with ABD pad tape to secure.   Wound Vac machine - discard canister and tubing.    Place wound vac in CLEAR plastic bag and place in the soiled utility room. DO NOT USE RED BIOHAZARD BAGS.    Please contact [facility] office [phone] and leave a message for [facility] to pick up.        Acute CVA   Petechial hemorrhage vs cortical laminar necrosis  - Stopped ASA and started Eliquis per neurology on 7/5 after >10 days after CVA. Continue statin therapy.  - Carotid Doppler showed plaque with  no critical stenosis  - Head CT on 7/7 w/ a few areas of cortical hyperdensity - petechial hemorrhage vs cortical laminar necrosis. Discussed with neuro OK to continue eliquis for now. Repeat head CT on 7/8 stable.  - EEG encephalopathic pattern no seizure, repeat MRI no new infarction  - PT OT recommend SNF on discharge     Acute hypoxemic respiratory failure resolved  COPD  - Extubated on 6/13, currently on room air  - X-ray repeated on 6/29 showed resolution of interstitial edema     ESRD  S/p CRRT this admission, now on HD TTS  Continue dialysis on routine basis as outpatient  IR removed the nonfunctional permacath and right Quinton IJ cath and replaced with a new permacath  on 6/26.     New onset Afib with RVR, improved  - CHA2DS2-VASc score is 7.   - Status post amiodarone drip. Now stable on oral amiodarone and coreg per cardiology recommendations  - GI and general surgery cleared for anticoagulation. Started on Eliquis since 07/23/22 with neurology clearance after CVA     Renal cell carcinoma outpatient follow-up hematology oncology  Urothelial carcinoma               Nutrition: Full liquid diet and mildly thick liquids; SLP evaluation this morning led to downgrading of patient's previous diet  Malnutrition Documentation    Severe malnutrition related to inadequate oral intake in the setting of acute illness as evidenced by PO intake meeting <50% of needs for >5 days, moderate-severe muscle loss (temporalis, deltoid, interosseous, quadriceps, gastrocnemius, trapezius, supraspinatus, infraspinatus), moderate fat loss (orbital, buccal, triceps), and 13% weight loss in >1 month          Patient has BMI=Body mass index is 33.63 kg/m.  Diagnosis: Obesity based on BMI criteria         Recent Labs   Lab 08/13/22  0322 08/12/22  0521 08/11/22  0211   Hemoglobin 7.3* 7.2* 7.7*   Hematocrit 23.1* 23.2* 24.5*   MCV 95.1 93.5 93.9   WBC 13.52* 11.02* 12.69*   Platelet Count 363* 288 272       Recent Labs   Lab 07/18/22  0421 06/02/22  0514   Ferritin 2,300.21*  --    Iron 18* 62   TIBC 99* 145*   Iron Saturation 18 43         Code Status: Full Code    Dispo: Possible discharge in next 24 hours to SNF after dialysis    Family Contact: Daughter, spoken to on the phone and updated    DVT Prophylaxis:   Current Facility-Administered Medications (Includes Only Anticoagulants, Misc. Hematological)   Medication Dose Route Last Admin    apixaban (ELIQUIS) tablet 2.5 mg  2.5 mg Oral 2.5 mg at 08/13/22 0948          CHART  REVIEW & DISCUSSION     The following chart items were reviewed as of 4:14 PM on 08/13/22:  [x]  Lab Results [x]  Imaging Results   [x]  Problem List  [x]  Current Orders [x]  Current  Medications  []  Allergies  []  Code Status [x]  Previous Notes   []  SDoH    The management and plan of care for this patient was discussed with the following specialty consultants:  []  Cardiology  []  Gastroenterology                 [x]  Infectious Disease  []  Pulmonology []  Neurology                []   Nephrology  []  Neurosurgery []  Orthopedic Surgery  []  Heme/Onc  []  General Surgery []  Psychiatry                                   []  Palliative    SUBJECTIVE     Shelby Barron is a 82 year old female seen examined at bedside.  Patient's nurse in the room during this encounter, conversed with infectious ease also just outside the door patient's room.  Call the patient's daughter and updated on the phone.  Discussed about nutrition and the fact that the patient is not really having enough nutrition along with feasibility of artificial nutrition in terms of feeding tube is likely out of question due to the intra-abdominal pathology and recent surgeries.    Of note, patient's nurse reports that the patient has not had a bowel movement since the 24th    MEDICATIONS     Current Facility-Administered Medications   Medication Dose Route Frequency    acetaminophen  650 mg Oral 4 times per day    Or    acetaminophen  650 mg Oral 4 times per day    amiodarone  200 mg per NG tube Daily    apixaban  2.5 mg Oral Q12H [facility]    atorvastatin  40 mg Oral QHS    b complex-vitamin c-folic acid  1 tablet Oral Daily    darbepoetin alfa  0.45 mcg/kg Subcutaneous Weekly    lidocaine  20 mL Intradermal Once    megestrol  400 mg Oral Daily    midodrine  10 mg Oral Q Tue/Thu/Sat (AM)    pantoprazole  40 mg per NG tube Q12H [facility]    polyethylene glycol  17 g Oral Daily    senna-docusate  1 tablet Oral QHS    thiamine  100 mg per NG tube Daily    umeclidinium-vilanterol  1 puff Inhalation QAM       PHYSICAL EXAM     Vitals:    08/13/22 1400   BP: 99/51   Pulse: 74   Resp: (!) 34   Temp:    SpO2: 94%       Temperature: Temp  Min: 97.5 F (36.4 C)   Max: 99.8 F (37.7 C)  Pulse: Pulse  Min: 69  Max: 93  Respiratory: Resp  Min: 20  Max: 38  Non-Invasive BP: BP  Min: 99/51  Max: 141/63  Pulse Oximetry SpO2  Min: 92 %  Max: 98 %    Intake and Output Summary (Last 24 hours) at Date Time    Intake/Output Summary (Last 24 hours) at 08/13/2022 1614  Last data filed at 08/13/2022 0800  Gross per 24 hour   Intake 180 ml   Output --   Net 180 ml         GEN APPEARANCE: Thin oriented x 2, on room air  HEENT: Anicteric  NECK: Supple;   CVS: RRR, S1, S2;   LUNGS: Breath sounds at bases  ABD: Soft; wound VAC in place; + Normoactive BS  EXT: No significant pitting edema noted  NEURO: No new focal deficits noted    LABS     Recent Labs   Lab 08/13/22  0322 08/12/22  0521 08/11/22  0211   WBC 13.52* 11.02* 12.69*   RBC 2.43* 2.48* 2.61*   Hemoglobin 7.3* 7.2* 7.7*   Hematocrit 23.1* 23.2* 24.5*   MCV 95.1 93.5 93.9  Platelet Count 363* 288 272       Recent Labs   Lab 08/13/22  0322 08/12/22  0521 08/11/22  0212 08/10/22  2236 08/10/22  0430   Sodium 142 143 143 143 141   Potassium 3.7 3.8 4.2 4.1 4.9   Chloride 103 105 105 105 100   CO2 28 23 23 24 25    BUN 15 32* 23* 22* 50*   Creatinine 2.9* 4.7* 3.7* 3.6* 6.2*   Glucose 96 91 110* 126* 83   Calcium 8.2 8.7 8.3 8.1 8.8   Magnesium 1.7 1.8 1.9  --   --        Recent Labs   Lab 08/13/22  0322 08/12/22  0521 08/11/22  0212 08/10/22  2236 08/10/22  0430 08/08/22  0420   ALT  --   --   --  8 10 8    AST (SGOT)  --   --   --  11 11 10    Bilirubin, Total  --   --   --  0.2 0.3 0.3   Albumin 1.4* 1.4* 1.4* 1.5* 1.5* 1.7*   Alkaline Phosphatase  --   --   --  85 91 95       Recent Labs   Lab 08/10/22  2236   hs Troponin 7.1             Microbiology Results (last 15 days)       Procedure Component Value Units Date/Time    Culture, Blood, Aerobic And Anaerobic [[id number]] Collected: 08/11/22 0208    Order Status: Completed Specimen: Blood, Venous Updated: 08/13/22 0700     Culture Blood No growth at 2 days    Culture, Blood, Aerobic And  Anaerobic [[id number]] Collected: 08/11/22 0208    Order Status: Completed Specimen: Blood, Venous Updated: 08/13/22 0700     Culture Blood No growth at 2 days             RADIOLOGY     CT Abdomen Pelvis WO IV/ WO PO Cont    Result Date: 08/11/2022  1. Colonic diverticulosis. No findings to suggest acute diverticulitis or colitis. 2. Small ascites and retroperitoneal free fluid, increased. 3. Nonspecific gallbladder distention. 4. Hiatal hernia. 5. Open midline wound. 6. Left flank hernia containing ascites. Kennyth Lose, MD 08/11/2022 4:23 PM    MRI Brain WO Contrast    Result Date: 08/11/2022  Motion degraded exam. No acute intracranial abnormality. No new interval infarct. Leota Jacobsen, MD 08/11/2022 12:51 PM    XR Chest AP Portable    Result Date: 08/11/2022  Mild left basilar airspace opacities and small bilateral pleural effusions. Judd Gaudier, MD 08/11/2022 2:48 AM    CT Head WO Contrast    Result Date: 08/11/2022   Redemonstrated multiple areas of subacute infarction with cortical hyperdensities which favor petechial hemorrhage in the right frontal lobe and bilateral parietal lobes. These are similar to the prior examination on 07/26/2022. No evidence of new or progressive acute infarct. No mass effect. Vernie Murders, MD 08/11/2022 12:00 AM    XR Chest AP Portable    Result Date: 08/02/2022   Hypoventilation with basilar atelectasis. Mild central vascular congestion. Charlott Rakes, MD 08/02/2022 3:23 PM    CT Head WO Contrast    Result Date: 07/26/2022   1.No significant change in multifocal areas of subacute infarction within bilateral cerebral hemispheres. 2.Small areas of petechial hemorrhage in the left parietal and occipital lobes are unchanged. No new hemorrhage  or measurable parenchymal hematoma. 3.Persistent complete opacification of the right mastoid air cells and middle ear cavity. Garnette Gunner, MD 07/26/2022 12:27 PM    CT Head WO Contrast    Result Date: 07/25/2022   1.Redemonstration of multifocal areas of  bilateral acute infarctions. A few areas of cortical hyperdensity likely represent petechial hemorrhage. No mass occupying hematoma. No significant mass effect or midline shift. 2.Redemonstrated right mastoid and right middle ear cavity effusion with areas of bony thinning versus dehiscence of the right tegmen tympani and right tegmen mastoideum. Vara Guardian, MD 07/25/2022 2:19 PM    CT Abdomen Pelvis W IV And PO Cont    Result Date: 07/19/2022   1.Slight interval increase in left-sided retroperitoneal fluid collection with the other abdominal fluid collections appearing relatively stable. 2. There is no definite abscess or acute inflammatory process. 3. Incidental note is made of a small paraesophageal type hernia. 4. Sigmoid diverticulosis is noted without CT evidence of diverticulitis. 5. Other findings as noted above. Miguel Dibble, MD 07/19/2022 6:36 AM    MRI Brain WO Contrast    Result Date: 07/18/2022   Interval evolution of multifocal recent infarcts in the bilateral cerebral hemispheres. No new infarct. Milan Waunita Schooner, MD 07/18/2022 5:57 PM    XR Chest AP Portable    Result Date: 07/17/2022  1.Near-complete resolution of interstitial edema. 2.Improving pulmonary vascular congestion. Legrand Pitts, MD 07/17/2022 2:38 PM    CT Head WO Contrast    Result Date: 07/17/2022   1.No new acute intracranial pathology. 2.There are multiple regions of cytotoxic edema in the right > left frontal and left > right occipital lobes, consistent with known early subacute infarcts. Dannielle Burn 07/17/2022 8:26 AM    US Carotid Duplex Dopp Comp Bilateral    Result Date: 07/15/2022   1. Plaque formation in the right internal carotid artery bulb with elevated velocities. Stenosis is estimated at 50-69%. 2. Plaque in the left internal carotid artery with normal velocities. Any stenosis is less than 50%. Criteria for stenosis and velocity parameters are based on the guidelines of the SRU (Society of Radiologists in Ultrasound). Grant et  al: Carotid Artery Stenosis: Grayscale and Doppler Ultrasound Diagnosis-- Society of Radiologists in Ultrasound Consensus Conference Ultrasound Quarterly. Volume 19, Number 4; December, 2003. Recommendations updated October 2021 Verne Carrow is an [facility] accredited facility Suszanne Finch, MD 07/15/2022 9:41 AM   Echo Results       None          Results for orders placed or performed during the hospital encounter of 06/28/22   MRI Brain WO Contrast    Narrative    HISTORY: Stroke, follow up. watershed infarcts this admission, now with  recurrent AMS, rule out recurrent stroke.      COMPARISON: Brain MRI 07/18/2022    TECHNIQUE: MRI of the brain performed on a 1.5 Tesla scanner without  intravenous contrast. per acute stroke protocol. Axial diffusion,  susceptibility, and FLAIR weighted sequences were obtained.     CONTRAST: None.    FINDINGS:   Intracranial: This exam is somewhat motion degraded which limits  sensitivity, but with diagnostic images still obtained. The below findings  are within this context.    There is no acute hemorrhage or acute infarct. Evolving change from prior  multifocal infarcts in bilateral cerebral hemispheres in the left MCA, ACA,  PCA watershed territories, with associated susceptibility artifact likely  representing old petechial hemorrhage. No new infarcts. T2 shine through on  DWI. Symmetric  ventricular and sulci enlargement consistent with volume  loss. There are nonspecific bilateral hemispheric periventricular, deep,  and subcortical white matter T2 FLAIR hyperintensities consistent with  chronic microangiopathic change. No masses or fluid collections.    Partially empty sella with expanded sella turcica. The cerebellar tonsils  are above the foramen magnum.     Vessels: Usual flow void appearance in the major arteries and veins  Bone: There are no suspicious calvarial or skull base lesions.  Extracranial: Status post bilateral lens surgery. The visualized paranasal  sinuses are  predominantly clear.. Complete opacification of the right  mastoid air cells.      Impression    Motion degraded exam. No acute intracranial abnormality. No new interval  infarct.    Leota Jacobsen, MD  08/11/2022 12:51 PM   CT Head WO Contrast    Narrative    HISTORY: Altered mental status.    COMPARISON: 07/26/2022.    TECHNIQUE: CT of the head performed without intravenous contrast.  Multiplanar reformatted images were created and reviewed. The following  dose reduction techniques were utilized: automated exposure control and/or  adjustment of the mA and/or KV according to patient size, and the use of an  iterative reconstruction technique.    CONTRAST: None.    FINDINGS:  Redemonstrated multiple areas of subacute infarction with cortical  hyperdensities which favor petechial hemorrhage in the right frontal lobe  and bilateral parietal lobes. These are similar to the prior examination on  07/26/2022.    No evidence of new or progressive acute infarct. No mass effect. No  evidence of new or progressive acute intracranial hemorrhage. No  extra-axial collection is seen.    Stable volume loss and hypodensities in the white matter consistent with  chronic ischemic changes.      Impression       Redemonstrated multiple areas of subacute infarction with cortical  hyperdensities which favor petechial hemorrhage in the right frontal lobe  and bilateral parietal lobes. These are similar to the prior examination on  07/26/2022. No evidence of new or progressive acute infarct. No mass  effect.    Vernie Murders, MD  08/11/2022 12:00 AM       Signed,  Donzetta Sprung, MD  4:14 PM 08/13/2022

## 2022-08-13 NOTE — UM Notes (Signed)
** This review is compiled from documentation provided by the treatment team within the patient's medical record. **     Shelby Litten, RN, BSN  Clinical Case Manager - Utilization Review  [facility]  [address]  [place], [place] [address]  UR Main no: [phone]  Fax: [phone]  [email or web address]  System Tax ID [id number]  [facility]            NPI [id number]  [place]      NPI [id number]  Einar Gip        NPI [id number]  Shelby Barron         NPI [id number]  [place]     NPI [id number]        Please use fax number to provide Authorization or to request additional information.      DATE OF REVIEW: 7/26    Patient Name: Shelby Barron       DOB: 1940/10/25  MRN: [id number]    PATIENT CLASS:  Patient placed to Inpatient on 06/28/2022    -Patient remains in ICU as [facility] level of care  -Wound vac care  -Vital signs  -Patient remains confused, A&O to self and place, poor judgement, poor safety awareness, delayed responses  -Neuro checks every 4 hours  -Full liquid diet    ASSESSMENT/PLAN      MAVI Barron is a 82 y.o. female admitted with Bladder cancer     Interval Summary:      Assessment and plan:     Hypotension  Encephalopathy  -Considering to be more vasovagal  -CT without contrast denies nasal congestion or sore throat for any acute findings apart from the abdominal wound from recent procedure  -Blood pressure stable, given midodrine prior to HD; next dose tomorrow and HD tomorrow  -Mentation is improved compared to previously alert and oriented x 2, stable  -Continue neurochecks every 4 hours     Acute toxic metabolic Encephalopathy/delirium stable-improved  Possible underlying cognitive impairment  - On and off confusion alert oriented x 2, now relatively stable the last several days  - Patient was upgraded to ICU on 6/30 for lethargy and decreased responsiveness. CT head was with no acute finding.   - EEG consistent with encephalopathy no seizure, CVA management as  below  - SLP evaluated patient today, pulled back diet to full liquid with mildly thickened liquid.  NG tube removed 7/9.  - Palliative signed off, family was clear they would like no limits to care and patient remained full code.     Anemia of CKD and acute blood loss anemia  -Hemoglobin stable today, 7.3 from 7.2; no bleeding diathesis noted  -Will monitor in the morning, threshold for transfusion is below 7.0  - given Aranesp this admission per nephrology  - No signs of active bleeding.     S/p open cystectomy, bilateral nephrectomies, and resection of small bowel after intra-op enterotomy    Mixed shock - hemorrhagic vs septic: Resolved  Necrotic wound tissue  - 6/10 small bowel enteretomy with 1L EBL. Went to the OR on June 12 for small bowel anastomosis, closure of mesenteric defect, closure of abdominal wall with wound VAC application. Wound VAC in place. Will need outpatient wound care follow up for ongoing management.  - Surgery consulted, CT abdomen with and without contrast did not show any anastomotic leak or abscess.   - Status post wound debridement by general surgery at bedside on 7/15 for necrotic wound tissue  -  ID consulted, completed meropenem and Mycamine for 2 weeks on 7/3. No further abx recommended after bedside debridement on 7/15  Wound care team recommendation.    When patient discharged or VAC discontinued -   Remove all foam dressing and non adherent dressing and place hydrogel moistened gauze into the wound bed, cover with ABD pad tape to secure.   Wound Vac machine - discard canister and tubing.    Place wound vac in CLEAR plastic bag and place in the soiled utility room. DO NOT USE RED BIOHAZARD BAGS.    Please contact WOC office [phone] and leave a message for WOC to pick up.        Acute CVA   Petechial hemorrhage vs cortical laminar necrosis  - Stopped ASA and started Eliquis per neurology on 7/5 after >10 days after CVA. Continue statin therapy.  - Carotid Doppler showed plaque with  no critical stenosis  - Head CT on 7/7 w/ a few areas of cortical hyperdensity - petechial hemorrhage vs cortical laminar necrosis. Discussed with neuro OK to continue eliquis for now. Repeat head CT on 7/8 stable.  - EEG encephalopathic pattern no seizure, repeat MRI no new infarction  - PT OT recommend SNF on discharge     Acute hypoxemic respiratory failure resolved  COPD  - Extubated on 6/13, currently on room air  - X-ray repeated on 6/29 showed resolution of interstitial edema     ESRD  S/p CRRT this admission, now on HD TTS  Continue dialysis on routine basis as outpatient  IR removed the nonfunctional permacath and right Quinton IJ cath and replaced with a new permacath on 6/26.     New onset Afib with RVR, improved  - CHA2DS2-VASc score is 7.   - Status post amiodarone drip. Now stable on oral amiodarone and coreg per cardiology recommendations  - GI and general surgery cleared for anticoagulation. 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 s/p bilateral radical nephrectomies and  cystectomy on 06/28/22  - complicated by small bowel enterotomy and blood loss   New onset afib - on amiodarone and anticoagulation  High grade urothelial bladder CA: s/p bilateral nephrectomies and cystectomy c/b mall bowel enterotomy and blood loss (~1L) on 06/28/22  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 - remains low  - on aranesp  Encephalopathy - persistent - episode of unresponsive last night   Acute  infarct involving bialteral cerebral hemispheres suggestive of watershed infarcts   HTN - BP labile     Recommendations:   S/p HD yesterday with limited UF given soft BP  Plan for routien HD tomorrow, midodrine pre-dialysis - will order     Per ID:  Assessment:      Encephalopathy  Acute CVA  Bladder cancer  Status post radical cystectomy/bilateral nephrectomy, enterectomy and lysis of adhesion (06/28/2022)  S/p small bowel resection  Abdominal wall wound  s/p bedside debridement/wound VAC placement (08/02/2022)  CT scan of abdomen and pelvis no acute findings (08/11/2022)  End-stage renal disease on hemodialysis  Atrial fibrillation  Mild leukocytosis-likely reactive  Risk of aspiration     Plan:      Continue monitoring without antibiotics  Continue local wound care (reviewed along with wound care nurse)  Follow-up chest x-ray  Continue probiotics  Monitor electrolytes and renal functions closely  Monitor clinically  Discussed with nursing staff  Discussed with Dr. Thedore Mins     Scheduled Meds:  Current Facility-Administered Medications   Medication Dose Route Frequency    acetaminophen  650 mg Oral 4 times per day    Or    acetaminophen  650 mg Oral 4 times per day    amiodarone  200 mg per NG tube Daily    apixaban  2.5 mg Oral Q12H SCH    atorvastatin  40 mg Oral QHS    b complex-vitamin c-folic acid  1 tablet Oral Daily    darbepoetin alfa  0.45 mcg/kg Subcutaneous Weekly    lidocaine  20 mL Intradermal Once    megestrol  400 mg Oral Daily    midodrine  10 mg Oral Q Tue/Thu/Sat (AM)    pantoprazole  40 mg per NG tube Q12H SCH    polyethylene glycol  17 g Oral Daily    senna-docusate  1 tablet Oral QHS    thiamine  100 mg per NG tube Daily    umeclidinium-vilanterol  1 puff Inhalation QAM     Continuous Infusions:  PRN Meds:.sodium chloride, acetaminophen **OR** acetaminophen **OR** acetaminophen, albuterol-ipratropium, benzocaine, calcium carbonate, dextrose **OR** dextrose **OR** dextrose **OR** glucagon (rDNA),  diphenhydrAMINE, hydrALAZINE, labetalol, lidocaine 2% jelly, naloxone, ondansetron, phenol, traMADol    Vitals:    08/13/22 1000 08/13/22 1200 08/13/22 1400 08/13/22 1600   BP: 127/59  99/51    Pulse: 80  74    Resp: (!) 28  (!) 34    Temp:  99.1 F (37.3 C)  98.8 F (37.1 C)   TempSrc:  Axillary  Axillary   SpO2: 96%  94%    Weight:       Height:          Latest Reference Range & Units 08/13/22 03:22   WBC 3.10 - 9.50 x10 3/uL 13.52 (H)   Hemoglobin 11.4 - 14.8 g/dL 7.3 (L)   Hematocrit 32.3 - 43.7 % 23.1 (L)   Platelet Count 142 - 346 x10 3/uL 363 (H)   (H): Data is abnormally high  (L): Data is abnormally low   Latest Reference Range & Units 08/13/22 03:22   Creatinine 0.4 - 1.0 mg/dL 2.9 (H)   (H): Data is abnormally high   Latest Reference Range & Units 08/13/22 03:22   Albumin 3.5 - 5.0 g/dL 1.4 (L)   (L): Data is abnormally low

## 2022-08-13 NOTE — Treatment Plan (Cosign Needed)
4 eyes in 4 hours pressure injury assessment note:      Completed with:   Unit & Time admitted:              Bony Prominences: Check appropriate box; if wound is present enter wound assessment in LDA     Occiput:                 [x] WNL  []  Wound present  Face:                     [x] WNL  []  Wound present  Ears:                      [x] WNL  []  Wound present  Spine:                    [x] WNL  []  Wound present  Shoulders:             [x] WNL  []  Wound present  Elbows:                  [x] WNL  []  Wound present  Sacrum/coccyx:     [] WNL  [x]  Wound present  Ischial Tuberosity:  [x] WNL  []  Wound present  Trochanter/Hip:      [x] WNL  []  Wound present  Knees:                   [x] WNL  []  Wound present  Ankles:                   [x] WNL  []  Wound present  Heels:                    [x] WNL  []  Wound present  Other pressure areas:  [x]  Wound location abdomen open with wound vac       Device related: []  Device name:         LDA completed if wound present: yes/no  Consult WOCN if necessary    Other skin related issues, ie tears, rash, etc, document in Integumentary flowsheet

## 2022-08-13 NOTE — SLP Progress Note (Signed)
[facility]  Speech Therapy Treatment Note    Patient:  Shelby Barron MRN#:  [id number]  Unit:  [facility] Room/Bed:  A2703/A2703-01      Time of treatment:   SLP Received On: 08/13/22  Start Time: 0920  Stop Time: 0940  Time Calculation (min): 20 min       Personal Protective Equipment (PPE)  Gloves and Procedure Mask with Shield    Subjective:     Pt is alert/ verbal , able to follow some commands       Objective: Oropharyngeal analysis during PO trials in order to determine LRD,development and training of swallow strategies in order to improve swallow function and decrease risk of penetration/aspiration;  therapeutic feedings by SLP  and safe swallowing precautions education.       Assessment: : Pt  presents with mod oral pharyngeal dysphagia characterized by delay in bolus transit , delay in pharyngeal swallow trigger and fairly adequate HLE upon subjective digital palpation. Pt tolerated 50% of breakfast  pt had one episode of emesis during meal , RN notified       Plan/Recommendations:  full liquid   and Mildly thick  MT2  Slow feeding rate, small bites / sips , upright position during and after the meals , Reflux precautions , full assistance with meals , liquid wash, and check oral cavity for residue         Charges Minutes   SLP treat    Swallow treat 20       08/13/2022  Presley Raddle MS [facility] SLP   08/13/2022 2:28 PM  [phone]

## 2022-08-13 NOTE — Plan of Care (Signed)
Problem: Moderate/High Fall Risk Score >5  Goal: Patient will remain free of falls  Flowsheets (Taken 08/13/2022 0800)  High (Greater than 13):   HIGH-Visual cue at entrance to patient's room   HIGH-Bed alarm on at all times while patient in bed   HIGH-Utilize chair pad alarm for patient while in the chair   HIGH-Apply yellow "Fall Risk" arm band   HIGH-Initiate use of floor mats as appropriate   HIGH-Consider use of low bed     Problem: Compromised Sensory Perception  Goal: Sensory Perception Interventions  Flowsheets (Taken 08/13/2022 0800)  Sensory Perception Interventions: Offload heels, Pad bony prominences, Reposition q 2hrs/turn Clock, Q2 hour skin assessment under devices if present     Problem: Compromised Moisture  Goal: Moisture level Interventions  Flowsheets (Taken 08/13/2022 0800)  Moisture level Interventions: Moisture wicking products, Moisture barrier cream     Problem: Compromised Activity/Mobility  Goal: Activity/Mobility Interventions  Flowsheets (Taken 08/13/2022 0800)  Activity/Mobility Interventions: Pad bony prominences, TAP Seated positioning system when OOB, Promote PMP, Reposition q 2 hrs / turn clock, Offload heels     Problem: Compromised Nutrition  Goal: Nutrition Interventions  Flowsheets (Taken 08/13/2022 0800)  Nutrition Interventions: Discuss nutrition at Rounds, I&Os, Document % meal eaten, Daily weights     Problem: Compromised Friction/Shear  Goal: Friction and Shear Interventions  Flowsheets (Taken 08/13/2022 0800)  Friction and Shear Interventions: Pad bony prominences, Off load heels, HOB 30 degrees or less unless contraindicated, Consider: TAP seated positioning, Heel foams     Problem: Inadequate Airway Clearance  Goal: Patent Airway maintained  Flowsheets (Taken 08/12/2022 1848)  Patent airway maintained:   Position patient for maximum ventilatory efficiency   Suction secretions as needed   Reposition patient every 2 hours and as needed unless able to self-reposition      Problem: Infection Prevention  Goal: Free from infection  Flowsheets (Taken 08/12/2022 1848)  Free from infection:   Monitor/assess vital signs   Encourage/assist patient to turn, cough and perform deep breathing every 2 hours   Assess incision for evidence of healing   Monitor/assess output from surgical drain if present   Monitor/assess lab values and report abnormal values   Assess for signs and symptoms of infection     Problem: Impaired Mobility  Goal: Mobility/Activity is maintained at optimal level for patient  Flowsheets (Taken 08/12/2022 1848)  Mobility/activity is maintained at optimal level for patient:   Encourage independent activity per ability   Consult/collaborate with Physical Therapy and/or Occupational Therapy     Problem: Constipation  Goal: Fluid and electrolyte balance are achieved/maintained  Flowsheets (Taken 08/13/2022 1158)  Fluid and electrolyte balance are achieved/maintained:   Monitor/assess lab values and report abnormal values   Assess and reassess fluid and electrolyte status   Observe for cardiac arrhythmias   Monitor for muscle weakness     Problem: Safety  Goal: Patient will be free from injury during hospitalization  Flowsheets (Taken 08/12/2022 1848)  Patient will be free from injury during hospitalization:   Assess patient's risk for falls and implement fall prevention plan of care per policy   Provide and maintain safe environment   Use appropriate transfer methods   Ensure appropriate safety devices are available at the bedside   Include patient/ family/ care giver in decisions related to safety   Hourly rounding   Assess for patients risk for elopement and implement Elopement Risk Plan per policy   Provide alternative method of communication if needed (communication boards, writing)  Problem: Hemodynamic Status: Cardiac  Goal: Stable vital signs and fluid balance  Flowsheets (Taken 08/13/2022 1158)  Stable vital signs and fluid balance:   Assess signs and symptoms associated  with cardiac rhythm changes   Monitor lab values     Problem: Renal Instability  Goal: Fluid and electrolyte balance are achieved/maintained  Flowsheets (Taken 08/13/2022 1158)  Fluid and electrolyte balance are achieved/maintained:   Monitor/assess lab values and report abnormal values   Assess and reassess fluid and electrolyte status   Observe for cardiac arrhythmias   Monitor for muscle weakness   Follow fluid restrictions/IV/PO parameters     Problem: Inadequate Gas Exchange  Goal: Patent Airway maintained  Flowsheets (Taken 08/12/2022 1848)  Patent airway maintained:   Position patient for maximum ventilatory efficiency   Suction secretions as needed   Reposition patient every 2 hours and as needed unless able to self-reposition     Problem: Every Day - Stroke  Goal: Mobility/Activity is maintained at optimal level for patient  Flowsheets (Taken 08/12/2022 1848)  Mobility/activity is maintained at optimal level for patient:   Encourage independent activity per ability   Consult/collaborate with Physical Therapy and/or Occupational Therapy

## 2022-08-13 NOTE — Plan of Care (Signed)
Neuro: A&O x 3, disoriented to time, follows commands. RUE 3/5, BLE spontaneous movement 1/5, LUE flicker of muscle to pain 1/5. PERRL. NIH 17.     CV: NSR HR 60-80s. SBP 100-120s.     GI: No BM this shift.     GU: Anuric.     Problem: Moderate/High Fall Risk Score >5  Goal: Patient will remain free of falls  Outcome: Progressing  Flowsheets (Taken 08/12/2022 2000)  High (Greater than 13):   HIGH-Consider use of low bed   HIGH-Initiate use of floor mats as appropriate   HIGH-Apply yellow "Fall Risk" arm band   HIGH-Bed alarm on at all times while patient in bed     Problem: Pain  Goal: Pain at adequate level as identified by patient  Outcome: Progressing  Flowsheets (Taken 08/13/2022 0132)  Pain at adequate level as identified by patient:   Identify patient comfort function goal   Assess pain on admission, during daily assessment and/or before any "as needed" intervention(s)   Reassess pain within 30-60 minutes of any procedure/intervention, per Pain Assessment, Intervention, Reassessment (AIR) Cycle   Evaluate if patient comfort function goal is met   Evaluate patient's satisfaction with pain management progress   Offer non-pharmacological pain management interventions     Problem: Neurological Deficit  Goal: Neurological status is stable or improving  Outcome: Progressing  Flowsheets (Taken 08/13/2022 0132)  Neurological status is stable or improving:   Monitor/assess/document neurological assessment (Stroke: every 4 hours)   Monitor/assess NIH Stroke Scale   Re-assess NIH Stroke Scale for any change in status   Observe for seizure activity and initiate seizure precautions if indicated   Perform CAM Assessment     Problem: Every Day - Stroke  Goal: Neurological status is stable or improving  Outcome: Progressing  Flowsheets (Taken 08/13/2022 0132)  Neurological status is stable or improving:   Monitor/assess/document neurological assessment (Stroke: every 4 hours)   Monitor/assess NIH Stroke Scale   Re-assess NIH  Stroke Scale for any change in status   Observe for seizure activity and initiate seizure precautions if indicated   Perform CAM Assessment     Problem: Pain interferes with ability to perform ADL  Goal: Pain at adequate level as identified by patient  Outcome: Progressing  Flowsheets (Taken 08/13/2022 0132)  Pain at adequate level as identified by patient:   Identify patient comfort function goal   Assess pain on admission, during daily assessment and/or before any "as needed" intervention(s)   Reassess pain within 30-60 minutes of any procedure/intervention, per Pain Assessment, Intervention, Reassessment (AIR) Cycle   Evaluate if patient comfort function goal is met   Evaluate patient's satisfaction with pain management progress   Offer non-pharmacological pain management interventions

## 2022-08-13 NOTE — Progress Notes (Signed)
Infectious Diseases & Tropical Medicine  Progress Note    08/13/2022   Carri Blazevich ONG:[id number],WNU:[id number] is a 82 y.o. female,       Assessment:     Encephalopathy  Acute CVA  Bladder cancer  Status post radical cystectomy/bilateral nephrectomy, enterectomy and lysis of adhesion (06/28/2022)  S/p small bowel resection  Abdominal wall wound s/p bedside debridement/wound VAC placement (08/02/2022)  CT scan of abdomen and pelvis no acute findings (08/11/2022)  End-stage renal disease on hemodialysis  Atrial fibrillation  Mild leukocytosis-likely reactive  Risk of aspiration    Plan:     Continue monitoring without antibiotics  Continue local wound care (reviewed along with wound care nurse)  Follow-up chest x-ray  Continue probiotics  Monitor electrolytes and renal functions closely  Monitor clinically  Discussed with nursing staff  Discussed with Dr. Thedore Mins    ROS:     General: No fever, awake, confused  Respiratory: No cough or shortness of breath  Gastrointestinal: No diarrhea per nursing staff, abdominal wall wound VAC in place  Genito-Urinary: no hematuria  Musculoskeletal: no edema  Neurological: awake  Dermatological: No rash    Physical Examination:     Blood pressure 141/63, pulse 85, temperature 99.8 F (37.7 C), temperature source Axillary, resp. rate 22, height 1.575 m (5\' 2" ), weight 83.4 kg (183 lb 13.8 oz), SpO2 98%.    General Appearance: Appears comfortable  HEENT: Pupils are equal, round, and reactive to light.   Lungs: Clear to auscultation  Heart: Regular rate and rhythm  Chest: Symmetric chest wall expansion.   Abdomen: soft, no hepatosplenomegaly, no bowel sounds, abdominal wall wound VAC in place  Neurological: awake  Extremities: No edema    Laboratory And Diagnostic Studies:     Recent Labs     08/13/22  0322 08/12/22  0521   WBC 13.52* 11.02*   Hemoglobin 7.3* 7.2*   Hematocrit 23.1* 23.2*   Platelet Count 363* 288     Recent Labs     08/13/22  0322 08/12/22  0521   Sodium 142 143    Potassium 3.7 3.8   Chloride 103 105   CO2 28 23   BUN 15 32*   Creatinine 2.9* 4.7*   Glucose 96 91   Calcium 8.2 8.7     Recent Labs     08/13/22  0322 08/12/22  0521 08/11/22  0212 08/10/22  2236   AST (SGOT)  --   --   --  11   ALT  --   --   --  8   Alkaline Phosphatase  --   --   --  85   Protein, Total  --   --   --  4.6*   Albumin 1.4* 1.4*   < > 1.5*    < > = values in this interval not displayed.         Current Meds:      Scheduled Meds: PRN Meds:    acetaminophen, 650 mg, Oral, 4 times per day   Or  acetaminophen, 650 mg, Oral, 4 times per day  amiodarone, 200 mg, per NG tube, Daily  apixaban, 2.5 mg, Oral, Q12H SCH  atorvastatin, 40 mg, Oral, QHS  b complex-vitamin c-folic acid, 1 tablet, Oral, Daily  darbepoetin alfa, 0.45 mcg/kg, Subcutaneous, Weekly  lidocaine, 20 mL, Intradermal, Once  midodrine, 10 mg, Oral, Q Tue/Thu/Sat (AM)  pantoprazole, 40 mg, per NG tube, Q12H SCH  senna-docusate, 1 tablet, Oral, QHS  thiamine,  100 mg, per NG tube, Daily  umeclidinium-vilanterol, 1 puff, Inhalation, QAM        Continuous Infusions:       sodium chloride, , PRN  acetaminophen, 650 mg, Q4H PRN   Or  acetaminophen, 650 mg, Q4H PRN   Or  acetaminophen, 650 mg, Q4H PRN  albuterol-ipratropium, 3 mL, Q6H PRN  benzocaine, 1 spray, TID PRN  calcium carbonate, 500 mg, Q4H PRN  dextrose, 15 g of glucose, PRN   Or  dextrose, 12.5 g, PRN   Or  dextrose, 12.5 g, PRN   Or  glucagon (rDNA), 1 mg, PRN  diphenhydrAMINE, 25 mg, Q6H PRN  hydrALAZINE, 10 mg, Q3H PRN  labetalol, 10 mg, Q15 Min PRN  lidocaine 2% jelly, , Once PRN  naloxone, 0.4 mg, PRN  ondansetron, 4 mg, Q6H PRN  phenol, 1 spray, Q2H PRN  polyethylene glycol, 17 g, Daily PRN  traMADol, 50 mg, Q8H PRN          Orean Giarratano A. Janalyn Rouse, M.D.  08/13/2022  8:42 AM

## 2022-08-13 NOTE — Treatment Plan (Cosign Needed)
4 eyes in 4 hours pressure injury assessment note:      Completed with:   Unit & Time admitted:              Bony Prominences: Check appropriate box; if wound is present enter wound assessment in LDA     Occiput:                 [x] WNL  []  Wound present  Face:                     [x] WNL  []  Wound present  Ears:                      [x] WNL  []  Wound present  Spine:                    [x] WNL  []  Wound present  Shoulders:             [x] WNL  []  Wound present  Elbows:                  [x] WNL  []  Wound present  Sacrum/coccyx:     [] WNL  [x]  Wound present  Ischial Tuberosity:  [x] WNL  []  Wound present  Trochanter/Hip:      [x] WNL  []  Wound present  Knees:                   [x] WNL  []  Wound present  Ankles:                   [x] WNL  []  Wound present  Heels:                    [x] WNL  []  Wound present  Other pressure areas:  []  Wound location       Device related: []  Device name:         LDA completed if wound present: Yes  Consult WOCN if necessary Yes    Other skin related issues, ie tears, rash, etc, document in Integumentary flowsheet

## 2022-08-14 LAB — RENAL FUNCTION PANEL
Albumin: 1.4 g/dL — ABNORMAL LOW (ref 3.5–5.0)
Anion Gap: 17 — ABNORMAL HIGH (ref 5.0–15.0)
BUN: 29 mg/dL — ABNORMAL HIGH (ref 7–21)
CO2: 25 mEq/L (ref 17–29)
Calcium: 8.9 mg/dL (ref 7.9–10.2)
Chloride: 102 mEq/L (ref 99–111)
Creatinine: 4 mg/dL — ABNORMAL HIGH (ref 0.4–1.0)
GFR: 10.5 mL/min/{1.73_m2} — ABNORMAL LOW (ref >=60.0)
Glucose: 113 mg/dL — ABNORMAL HIGH (ref 70–100)
Phosphorus: 3.3 mg/dL (ref 2.3–4.7)
Potassium: 3.8 mEq/L (ref 3.5–5.3)
Sodium: 144 mEq/L (ref 135–145)

## 2022-08-14 LAB — LAB USE ONLY - CBC WITH DIFFERENTIAL
Absolute Basophils: 0.06 10*3/uL (ref 0.00–0.08)
Absolute Eosinophils: 0.01 10*3/uL (ref 0.00–0.44)
Absolute Immature Granulocytes: 0.38 10*3/uL — ABNORMAL HIGH (ref 0.00–0.07)
Absolute Lymphocytes: 3.69 10*3/uL — ABNORMAL HIGH (ref 0.42–3.22)
Absolute Monocytes: 1.55 10*3/uL — ABNORMAL HIGH (ref 0.21–0.85)
Absolute Neutrophils: 8.89 10*3/uL — ABNORMAL HIGH (ref 1.10–6.33)
Absolute nRBC: 0 10*3/uL (ref <=0.00)
Basophils %: 0.4 %
Eosinophils %: 0.1 %
Hematocrit: 27.3 % — ABNORMAL LOW (ref 34.7–43.7)
Hemoglobin: 8.7 g/dL — ABNORMAL LOW (ref 11.4–14.8)
Immature Granulocytes %: 2.6 %
Lymphocytes %: 25.3 %
MCH: 29.5 pg (ref 25.1–33.5)
MCHC: 31.9 g/dL (ref 31.5–35.8)
MCV: 92.5 fL (ref 78.0–96.0)
MPV: 9.6 fL (ref 8.9–12.5)
Monocytes %: 10.6 %
Neutrophils %: 61 %
Platelet Count: 363 10*3/uL — ABNORMAL HIGH (ref 142–346)
Preliminary Absolute Neutrophil Count: 8.89 10*3/uL — ABNORMAL HIGH (ref 1.10–6.33)
RBC: 2.95 10*6/uL — ABNORMAL LOW (ref 3.90–5.10)
RDW: 15 % (ref 11–15)
WBC: 14.58 10*3/uL — ABNORMAL HIGH (ref 3.10–9.50)
nRBC %: 0 /100 WBC (ref <=0.0)

## 2022-08-14 LAB — MAGNESIUM: Magnesium: 1.8 mg/dL (ref 1.6–2.6)

## 2022-08-14 LAB — WHOLE BLOOD GLUCOSE POCT
Whole Blood Glucose POCT: 115 mg/dL — ABNORMAL HIGH (ref 70–100)
Whole Blood Glucose POCT: 145 mg/dL — ABNORMAL HIGH (ref 70–100)
Whole Blood Glucose POCT: 100 mg/dL (ref 70–100)
Whole Blood Glucose POCT: 97 mg/dL (ref 70–100)
Whole Blood Glucose POCT: 131 mg/dL — ABNORMAL HIGH (ref 70–100)

## 2022-08-14 LAB — CALCIUM, IONIZED: Calcium, Ionized: 2.21 mEq/L — ABNORMAL LOW (ref 2.23–2.56)

## 2022-08-14 MED ORDER — SODIUM CHLORIDE 0.9 % IV BOLUS
250.0000 mL | INTRAVENOUS | Status: AC | PRN
Start: 2022-08-14 — End: 2022-08-14

## 2022-08-14 MED ORDER — MORPHINE SULFATE 2 MG/ML IJ/IV SOLN (WRAP)
1.0000 mg | Freq: Once | Status: AC
Start: 2022-08-14 — End: 2022-08-14
  Administered 2022-08-14: 1 mg via INTRAVENOUS
  Filled 2022-08-14: qty 1

## 2022-08-14 MED ORDER — ALBUMIN HUMAN 25 % IV SOLN
100.0000 mL | INTRAVENOUS | Status: AC | PRN
Start: 2022-08-14 — End: 2022-08-14
  Administered 2022-08-14: 25 g via INTRAVENOUS
  Filled 2022-08-14: qty 100

## 2022-08-14 MED ORDER — SODIUM CHLORIDE 0.9 % IV BOLUS
100.0000 mL | INTRAVENOUS | Status: AC | PRN
Start: 2022-08-14 — End: 2022-08-14

## 2022-08-14 NOTE — Plan of Care (Signed)
NURSING SHIFT NOTE     Patient: Shelby Barron  Day: 48      SHIFT EVENTS     Shift Narrative/Significant Events (PRN med administration, fall, RRT, etc.):     Pt Aox1 to self, on room air, NSR with PVCs on tele and stable at this time.    Pt drank 2 full ensures this shift, but less than 25% of lunch and dinner.    Pt turned Q 2 hours and cleaned of stool.     Morphine and tramadol given this shift for moderate to severe PAINAD scores. Pain otherwise relieved on repositioning patient.    Family and caregiver at bedside updated throughout shift.    Safety and fall precautions remain in place. Purposeful rounding completed.          ASSESSMENT     Changes in assessment from patient's baseline this shift:    Neuro: No  CV: No  Pulm: No  Peripheral Vascular: No  HEENT: No  GI: No  BM during shift: Yes   , Last BM: Last BM Date: 08/14/22  GU: No   Integ: No  MS: No    Pain: No change  Pain Interventions: Medications  Medications Utilized: morphine intravenous and tramadol    Mobility: PMP Activity: Step 3 - Bed Mobility of Distance Walked (ft) (Step 6,7): 0 Feet           Lines     Patient Lines/Drains/Airways Status       Active Lines, Drains and Airways       Name Placement date Placement time Site Days    Permacath Catheter - Tunneled 07/14/22 Subclavian vein catheter Right 07/14/22  1404  -- 31    Peripheral IV 08/11/22 20 G 08/11/22  --  --  3    Peripheral IV 08/11/22 20 G Right Antecubital 08/11/22  --  Antecubital  3    Peripheral IV 08/11/22 22 G Anterior;Right Hand 08/11/22  --  Hand  3                         VITAL SIGNS     Vitals:    08/14/22 1800   BP: 104/50   Pulse: 83   Resp: (!) 30   Temp:    SpO2: 100%       Temp  Min: 97.7 F (36.5 C)  Max: 99 F (37.2 C)  Pulse  Min: 69  Max: 96  Resp  Min: 18  Max: 32  BP  Min: 91/44  Max: 143/63  SpO2  Min: 94 %  Max: 100 %      Intake/Output Summary (Last 24 hours) at 08/14/2022 2028  Last data filed at 08/14/2022 1800  Gross per 24 hour   Intake 492 ml   Output  600 ml   Net -108 ml            CARE PLAN                Problem: Moderate/High Fall Risk Score >5  Goal: Patient will remain free of falls  Outcome: Progressing  Flowsheets  Taken 08/13/2022 2000 by Clearnce Sorrel, RN  High (Greater than 13):   HIGH-Initiate use of floor mats as appropriate   HIGH-Pharmacy to initiate evaluation and intervention per protocol   HIGH-Apply yellow "Fall Risk" arm band   HIGH-Utilize chair pad alarm for patient while in the chair   HIGH-Bed alarm on at all  times while patient in bed   HIGH-Visual cue at entrance to patient's room   MOD-Place Fall Risk level on whiteboard in room   MOD-Include family in multidisciplinary POC discussions   MOD-Request PT/OT consult order for patients with gait/mobility impairment   MOD-Utilize diversion activities   MOD-Perform dangle, stand, walk (DSW) prior to mobilization   MOD-Re-orient confused patients   MOD-Use of assistive devices -Bedside Commode if appropriate   MOD-Remain with patient during toileting   MOD-Consider a move closer to Nurses Station   MOD-Use of chair-pad alarm when appropriate   LOW-Anticoagulation education for injury risk   LOW-Fall Interventions Appropriate for Low Fall Risk  Taken 08/09/2022 0800 by Wess Botts, RN  Moderate Risk (6-13):   MOD-Apply bed exit alarm if patient is confused   MOD-Floor mat at bedside (where available) if appropriate  Taken 07/31/2022 0618 by Connye Burkitt, RN  VH Moderate Risk (6-13): YELLOW "FALL RISK" ARM BAND  Taken 07/29/2022 1755 by Lorriane Shire, RN  VH High Risk (Greater than 13):   RED "HIGH FALL RISK" SIGNAGE   BED ALARM WILL BE ACTIVATED WHEN THE PATEINT IS IN BED WITH SIGNAGE "RESET BED ALARM"   A CHAIR PAD ALARM WILL BE USED WHEN PATIENT IS UP SITTING IN A CHAIR   A safety companion may be used when deemed appropriate by the Primary RN and Clinical Administrator   PATIENT IS TO BE SUPERVISED FOR ALL TOILETING ACTIVITIES   Keep door open for better visibility   Include  family/significant other in multidisciplinary discussion regarding plan of care as appropriate   Request PT/OT therapy consult order from physician for patients with gait/mobility impairment   Use assistive devices   Use chair-pad alarm device   Use of floor mat   Use of roll guard     Problem: Compromised Activity/Mobility  Goal: Activity/Mobility Interventions  Outcome: Progressing  Flowsheets (Taken 08/14/2022 1831)  Activity/Mobility Interventions: Pad bony prominences, TAP Seated positioning system when OOB, Promote PMP, Reposition q 2 hrs / turn clock, Offload heels     Problem: Impaired Mobility  Goal: Mobility/Activity is maintained at optimal level for patient  Outcome: Progressing  Flowsheets (Taken 08/12/2022 1848 by Iona Beard, RN)  Mobility/activity is maintained at optimal level for patient:   Encourage independent activity per ability   Consult/collaborate with Physical Therapy and/or Occupational Therapy     Problem: Constipation  Goal: Fluid and electrolyte balance are achieved/maintained  Outcome: Progressing  Flowsheets (Taken 08/13/2022 1158 by Iona Beard, RN)  Fluid and electrolyte balance are achieved/maintained:   Monitor/assess lab values and report abnormal values   Assess and reassess fluid and electrolyte status   Observe for cardiac arrhythmias   Monitor for muscle weakness     Problem: Fluid and Electrolyte Imbalance/ Endocrine  Goal: Fluid and electrolyte balance are achieved/maintained  Outcome: Progressing  Flowsheets (Taken 08/13/2022 1158 by Iona Beard, RN)  Fluid and electrolyte balance are achieved/maintained:   Monitor/assess lab values and report abnormal values   Assess and reassess fluid and electrolyte status   Observe for cardiac arrhythmias   Monitor for muscle weakness     Problem: Neurological Deficit  Goal: Neurological status is stable or improving  Outcome: Progressing  Flowsheets (Taken 08/13/2022 0132 by Lia Hopping, RN)  Neurological status is stable  or improving:   Monitor/assess/document neurological assessment (Stroke: every 4 hours)   Monitor/assess NIH Stroke Scale   Re-assess NIH Stroke Scale for any change in status   Observe for  seizure activity and initiate seizure precautions if indicated   Perform CAM Assessment     Problem: Every Day - Stroke  Goal: Mobility/Activity is maintained at optimal level for patient  Outcome: Progressing  Flowsheets (Taken 08/12/2022 1848 by Iona Beard, RN)  Mobility/activity is maintained at optimal level for patient:   Encourage independent activity per ability   Consult/collaborate with Physical Therapy and/or Occupational Therapy  Goal: Neurological status is stable or improving  Outcome: Progressing  Flowsheets (Taken 08/13/2022 0132 by Lia Hopping, RN)  Neurological status is stable or improving:   Monitor/assess/document neurological assessment (Stroke: every 4 hours)   Monitor/assess NIH Stroke Scale   Re-assess NIH Stroke Scale for any change in status   Observe for seizure activity and initiate seizure precautions if indicated   Perform CAM Assessment  Goal: Patient's risk of aspiration will be minimized  Outcome: Progressing  Flowsheets (Taken 08/11/2022 1024 by Julien Girt, RN)  Patient's risk of aspiration will be minimized:   Complete new dysphagia screen for any change in status: Keep patient NPO if patient fails   Monitor/assess for signs of aspiration (tachypnea, cough, wheezing, clearing throat, hoarseness after eating, decrease in SaO2   Place swallow precaution signage above bed   HOB up 90 degrees to eat if unable to be OOB  Goal: Skin integrity is maintained or improved  Outcome: Progressing  Flowsheets (Taken 08/12/2022 1848 by Iona Beard, RN)  Skin integrity is maintained or improved:   Assess Braden Scale every shift   Turn or reposition patient every 2 hours or as needed unless able to reposition self   Keep skin clean and dry   Avoid shearing   Encourage use of lotion/moisturizer on  skin   Monitor patient's hygiene practices   Collaborate with Wound, Ostomy, and Continence Nurse   Increase activity as tolerated/progressive mobility   Relieve pressure to bony prominences   Utilize specialty bed   Keep head of bed 30 degrees or less (unless contraindicated)

## 2022-08-14 NOTE — Plan of Care (Signed)
Problem: Renal Instability  Goal: Fluid and electrolyte balance are achieved/maintained  Outcome: Progressing  Flowsheets (Taken 08/14/2022 0946)  Fluid and electrolyte balance are achieved/maintained:   Monitor/assess lab values and report abnormal values   Assess and reassess fluid and electrolyte status   Monitor for muscle weakness   Observe for cardiac arrhythmias   Follow fluid restrictions/IV/PO parameters     Problem: Patient Receiving Advanced Renal Therapies  Goal: Therapy access site remains intact  Flowsheets (Taken 08/14/2022 0946)  Therapy access site remains intact:   Assess therapy access site   Change therapy access site dressing as needed

## 2022-08-14 NOTE — Progress Notes (Signed)
[facility]  PROGRESS NOTE      Patient: Shelby Barron  Date: 08/14/2022   LOS: 47 Days  Admission Date: 06/28/2022   MRN: [id number]  Attending: Donzetta Sprung, MD  When on service as the attending, please contact me on Epic Secure Chat from 7 AM - 7 PM for non-urgent issues. For urgent matters use XTend page from 7 AM - 7 PM.       ASSESSMENT/PLAN     Shelby Barron is a 82 y.o. female admitted with Bladder cancer    Interval Summary:     Assessment and plan:     Hypotension  Encephalopathy  -Considering to be more vasovagal  -CT without contrast denies nasal congestion or sore throat for any acute findings apart from the abdominal wound from recent procedure  -Blood pressure stable, given midodrine prior to HD; soft blood pressures during HD today, status post albumin infusion and 600 mL removed  -Mentation is improved, alert and oriented x 3, stable  -Continue neurochecks every 4 hours     Acute toxic metabolic Encephalopathy/delirium stable-improved  Possible underlying cognitive impairment  - On and off confusion alert oriented x 2, now relatively stable the last several days  - Patient was upgraded to ICU on 6/30 for lethargy and decreased responsiveness. CT head was with no acute finding.   - EEG consistent with encephalopathy no seizure, CVA management as below  - SLP evaluated patient today, pulled back diet to full liquid with mildly thickened liquid.  NG tube removed 7/9.  - Palliative signed off, family was clear they would like no limits to care and patient remained full code.     Anemia of CKD and acute blood loss anemia  -Hemoglobin stable today, 7.3 from 7.2; no bleeding diathesis noted  -Will monitor in the morning, threshold for transfusion is below 7.0  - given Aranesp this admission per nephrology  - No signs of active bleeding.     S/p open cystectomy, bilateral nephrectomies, and resection of small bowel after intra-op enterotomy    Mixed shock - hemorrhagic vs septic:  Resolved  Necrotic wound tissue  - 6/10 small bowel enteretomy with 1L EBL. Went to the OR on June 12 for small bowel anastomosis, closure of mesenteric defect, closure of abdominal wall with wound VAC application. Wound VAC in place. Will need outpatient wound care follow up for ongoing management.  - Surgery consulted, CT abdomen with and without contrast did not show any anastomotic leak or abscess.   - Status post wound debridement by general surgery at bedside on 7/15 for necrotic wound tissue  - ID consulted, completed meropenem and Mycamine for 2 weeks on 7/3. No further abx recommended after bedside debridement on 7/15  -Family would like reevaluation by wound care on Monday around change of wound VAC bandaging before discharge  Wound care team recommendation.    When patient discharged or VAC discontinued -   Remove all foam dressing and non adherent dressing and place hydrogel moistened gauze into the wound bed, cover with ABD pad tape to secure.   Wound Vac machine - discard canister and tubing.    Place wound vac in CLEAR plastic bag and place in the soiled utility room. DO NOT USE RED BIOHAZARD BAGS.    Please contact [facility] office [phone] and leave a message for [facility] to pick up.        Acute CVA   Petechial hemorrhage vs cortical laminar necrosis  - Stopped  ASA and started Eliquis per neurology on 7/5 after >10 days after CVA. Continue statin therapy.  - Carotid Doppler showed plaque with no critical stenosis  - Head CT on 7/7 w/ a few areas of cortical hyperdensity - petechial hemorrhage vs cortical laminar necrosis. Discussed with neuro OK to continue eliquis for now. Repeat head CT on 7/8 stable.  - EEG encephalopathic pattern no seizure, repeat MRI no new infarction  - PT OT recommend SNF on discharge     Acute hypoxemic respiratory failure resolved  COPD  - Extubated on 6/13, currently on room air  - X-ray repeated on 6/29 showed resolution of interstitial edema     ESRD  S/p CRRT this admission, now on  HD TTS  Continue dialysis on routine basis as outpatient  IR removed the nonfunctional permacath and right Quinton IJ cath and replaced with a new permacath on 6/26.     New onset Afib with RVR, improved  - CHA2DS2-VASc score is 7.   - Status post amiodarone drip. Now stable on oral amiodarone and coreg per cardiology recommendations  - GI and general surgery cleared for anticoagulation. Started on Eliquis since 07/23/22 with neurology clearance after CVA     Renal cell carcinoma outpatient follow-up hematology oncology  Urothelial carcinoma                 Nutrition: Full liquid diet with mildly thick liquids  Malnutrition Documentation    Severe malnutrition related to inadequate oral intake in the setting of acute illness as evidenced by PO intake meeting <50% of needs for >5 days, moderate-severe muscle loss (temporalis, deltoid, interosseous, quadriceps, gastrocnemius, trapezius, supraspinatus, infraspinatus), moderate fat loss (orbital, buccal, triceps), and 13% weight loss in >1 month          Patient has BMI=Body mass index is 32.9 kg/m.  Diagnosis: Obesity based on BMI criteria         Recent Labs   Lab 08/14/22  0531 08/13/22  0322 08/12/22  0521   Hemoglobin 8.7* 7.3* 7.2*   Hematocrit 27.3* 23.1* 23.2*   MCV 92.5 95.1 93.5   WBC 14.58* 13.52* 11.02*   Platelet Count 363* 363* 288       Recent Labs   Lab 07/18/22  0421 06/02/22  0514   Ferritin 2,300.21*  --    Iron 18* 62   TIBC 99* 145*   Iron Saturation 18 43         Code Status: Full Code    Dispo: Monday after wound VAC bandaging replacement and reassessment    Family Contact: Daughter, Genora Claycamp, updated over the phone with patient's husband present in the background; phone was on speaker    DVT Prophylaxis:   Current Facility-Administered Medications (Includes Only Anticoagulants, Misc. Hematological)   Medication Dose Route Last Admin    apixaban (ELIQUIS) tablet 2.5 mg  2.5 mg Oral 2.5 mg at 08/14/22 0858          CHART  REVIEW & DISCUSSION      The following chart items were reviewed as of 3:34 PM on 08/14/22:  [x]  Lab Results [x]  Imaging Results   [x]  Problem List  [x]  Current Orders [x]  Current Medications  []  Allergies  []  Code Status [x]  Previous Notes   []  SDoH    The management and plan of care for this patient was discussed with the following specialty consultants:  []  Cardiology  []  Gastroenterology                 [  x] Infectious Disease  []  Pulmonology []  Neurology                [x]  Nephrology  []  Neurosurgery []  Orthopedic Surgery  []  Heme/Onc  []  General Surgery []  Psychiatry                                   []  Palliative    SUBJECTIVE     JANASIA IVES is an 82 year old female seen examined at bedside.  Patient might be taken to dialysis during this encounter.  States that she does not feel the best due to the pain around her abdomen, educated that her blood pressure is a major concern and will be given pain medication IV when she comes back from dialysis.  1 mg morphine provided.  Family called and updated.  Family would like the patient to remain in the hospital until reevaluation by wound care on Monday.  Otherwise agreeable with current plan of care.    MEDICATIONS     Current Facility-Administered Medications   Medication Dose Route Frequency    acetaminophen  650 mg Oral 4 times per day    Or    acetaminophen  650 mg Oral 4 times per day    amiodarone  200 mg per NG tube Daily    apixaban  2.5 mg Oral Q12H SCH    atorvastatin  40 mg Oral QHS    b complex-vitamin c-folic acid  1 tablet Oral Daily    darbepoetin alfa  0.45 mcg/kg Subcutaneous Weekly    lidocaine  20 mL Intradermal Once    megestrol  400 mg Oral Daily    midodrine  10 mg Oral Q Tue/Thu/Sat (AM)    morphine  1 mg Intravenous Once    pantoprazole  40 mg per NG tube Q12H SCH    polyethylene glycol  17 g Oral Daily    senna-docusate  1 tablet Oral QHS    thiamine  100 mg per NG tube Daily    umeclidinium-vilanterol  1 puff Inhalation QAM       PHYSICAL EXAM     Vitals:     08/14/22 1310   BP: 108/57   Pulse: 77   Resp: 18   Temp: 97.7 F (36.5 C)   SpO2: 98%       Temperature: Temp  Min: 97.7 F (36.5 C)  Max: 99.4 F (37.4 C)  Pulse: Pulse  Min: 69  Max: 96  Respiratory: Resp  Min: 18  Max: 32  Non-Invasive BP: BP  Min: 91/44  Max: 147/67  Pulse Oximetry SpO2  Min: 94 %  Max: 100 %    Intake and Output Summary (Last 24 hours) at Date Time    Intake/Output Summary (Last 24 hours) at 08/14/2022 1534  Last data filed at 08/14/2022 1310  Gross per 24 hour   Intake --   Output 600 ml   Net -600 ml         GEN APPEARANCE: A&OX3, on room air  HEENT: Anicteric  NECK: Supple  CVS: RRR, S1, S2  LUNGS: Decreased breath sounds at bases, no crackles noted  ABD: Soft; painful to touch around the area of her incision and wound care bandaging; + Normoactive BS  EXT: No significant pitting edema  NEURO: No new focal deficits noted    LABS     Recent Labs   Lab 08/14/22  0531  08/13/22  0322 08/12/22  0521   WBC 14.58* 13.52* 11.02*   RBC 2.95* 2.43* 2.48*   Hemoglobin 8.7* 7.3* 7.2*   Hematocrit 27.3* 23.1* 23.2*   MCV 92.5 95.1 93.5   Platelet Count 363* 363* 288       Recent Labs   Lab 08/14/22  0531 08/13/22  0322 08/12/22  0521 08/11/22  0212 08/10/22  2236   Sodium 144 142 143 143 143   Potassium 3.8 3.7 3.8 4.2 4.1   Chloride 102 103 105 105 105   CO2 25 28 23 23 24    BUN 29* 15 32* 23* 22*   Creatinine 4.0* 2.9* 4.7* 3.7* 3.6*   Glucose 113* 96 91 110* 126*   Calcium 8.9 8.2 8.7 8.3 8.1   Magnesium 1.8 1.7 1.8 1.9  --        Recent Labs   Lab 08/14/22  0531 08/13/22  0322 08/12/22  0521 08/11/22  0212 08/10/22  2236 08/10/22  0430 08/08/22  0420   ALT  --   --   --   --  8 10 8    AST (SGOT)  --   --   --   --  11 11 10    Bilirubin, Total  --   --   --   --  0.2 0.3 0.3   Albumin 1.4* 1.4* 1.4*  More results in Results Review 1.5* 1.5* 1.7*   Alkaline Phosphatase  --   --   --   --  85 91 95   More results in Results Review = values in this interval not displayed.       Recent Labs   Lab  08/10/22  2236   hs Troponin 7.1             Microbiology Results (last 15 days)       Procedure Component Value Units Date/Time    Culture, Blood, Aerobic And Anaerobic [[id number]] Collected: 08/11/22 0208    Order Status: Completed Specimen: Blood, Venous Updated: 08/14/22 0700     Culture Blood No growth at 3 days    Culture, Blood, Aerobic And Anaerobic [[id number]] Collected: 08/11/22 0208    Order Status: Completed Specimen: Blood, Venous Updated: 08/14/22 0700     Culture Blood No growth at 3 days             RADIOLOGY     CT Abdomen Pelvis WO IV/ WO PO Cont    Result Date: 08/11/2022  1. Colonic diverticulosis. No findings to suggest acute diverticulitis or colitis. 2. Small ascites and retroperitoneal free fluid, increased. 3. Nonspecific gallbladder distention. 4. Hiatal hernia. 5. Open midline wound. 6. Left flank hernia containing ascites. Kennyth Lose, MD 08/11/2022 4:23 PM    MRI Brain WO Contrast    Result Date: 08/11/2022  Motion degraded exam. No acute intracranial abnormality. No new interval infarct. Leota Jacobsen, MD 08/11/2022 12:51 PM    XR Chest AP Portable    Result Date: 08/11/2022  Mild left basilar airspace opacities and small bilateral pleural effusions. Judd Gaudier, MD 08/11/2022 2:48 AM    CT Head WO Contrast    Result Date: 08/11/2022   Redemonstrated multiple areas of subacute infarction with cortical hyperdensities which favor petechial hemorrhage in the right frontal lobe and bilateral parietal lobes. These are similar to the prior examination on 07/26/2022. No evidence of new or progressive acute infarct. No mass effect. Vernie Murders, MD 08/11/2022 12:00 AM  XR Chest AP Portable    Result Date: 08/02/2022   Hypoventilation with basilar atelectasis. Mild central vascular congestion. Charlott Rakes, MD 08/02/2022 3:23 PM    CT Head WO Contrast    Result Date: 07/26/2022   1.No significant change in multifocal areas of subacute infarction within bilateral cerebral hemispheres. 2.Small  areas of petechial hemorrhage in the left parietal and occipital lobes are unchanged. No new hemorrhage or measurable parenchymal hematoma. 3.Persistent complete opacification of the right mastoid air cells and middle ear cavity. Garnette Gunner, MD 07/26/2022 12:27 PM    CT Head WO Contrast    Result Date: 07/25/2022   1.Redemonstration of multifocal areas of bilateral acute infarctions. A few areas of cortical hyperdensity likely represent petechial hemorrhage. No mass occupying hematoma. No significant mass effect or midline shift. 2.Redemonstrated right mastoid and right middle ear cavity effusion with areas of bony thinning versus dehiscence of the right tegmen tympani and right tegmen mastoideum. Vara Guardian, MD 07/25/2022 2:19 PM    CT Abdomen Pelvis W IV And PO Cont    Result Date: 07/19/2022   1.Slight interval increase in left-sided retroperitoneal fluid collection with the other abdominal fluid collections appearing relatively stable. 2. There is no definite abscess or acute inflammatory process. 3. Incidental note is made of a small paraesophageal type hernia. 4. Sigmoid diverticulosis is noted without CT evidence of diverticulitis. 5. Other findings as noted above. Miguel Dibble, MD 07/19/2022 6:36 AM    MRI Brain WO Contrast    Result Date: 07/18/2022   Interval evolution of multifocal recent infarcts in the bilateral cerebral hemispheres. No new infarct. Milan Waunita Schooner, MD 07/18/2022 5:57 PM    XR Chest AP Portable    Result Date: 07/17/2022  1.Near-complete resolution of interstitial edema. 2.Improving pulmonary vascular congestion. Legrand Pitts, MD 07/17/2022 2:38 PM    CT Head WO Contrast    Result Date: 07/17/2022   1.No new acute intracranial pathology. 2.There are multiple regions of cytotoxic edema in the right > left frontal and left > right occipital lobes, consistent with known early subacute infarcts. Dannielle Burn 07/17/2022 8:26 AM   Echo Results       None          Results for orders placed or performed  during the hospital encounter of 06/28/22   MRI Brain WO Contrast    Narrative    HISTORY: Stroke, follow up. watershed infarcts this admission, now with  recurrent AMS, rule out recurrent stroke.      COMPARISON: Brain MRI 07/18/2022    TECHNIQUE: MRI of the brain performed on a 1.5 Tesla scanner without  intravenous contrast. per acute stroke protocol. Axial diffusion,  susceptibility, and FLAIR weighted sequences were obtained.     CONTRAST: None.    FINDINGS:   Intracranial: This exam is somewhat motion degraded which limits  sensitivity, but with diagnostic images still obtained. The below findings  are within this context.    There is no acute hemorrhage or acute infarct. Evolving change from prior  multifocal infarcts in bilateral cerebral hemispheres in the left MCA, ACA,  PCA watershed territories, with associated susceptibility artifact likely  representing old petechial hemorrhage. No new infarcts. T2 shine through on  DWI. Symmetric ventricular and sulci enlargement consistent with volume  loss. There are nonspecific bilateral hemispheric periventricular, deep,  and subcortical white matter T2 FLAIR hyperintensities consistent with  chronic microangiopathic change. No masses or fluid collections.    Partially empty sella with expanded  sella turcica. The cerebellar tonsils  are above the foramen magnum.     Vessels: Usual flow void appearance in the major arteries and veins  Bone: There are no suspicious calvarial or skull base lesions.  Extracranial: Status post bilateral lens surgery. The visualized paranasal  sinuses are predominantly clear.. Complete opacification of the right  mastoid air cells.      Impression    Motion degraded exam. No acute intracranial abnormality. No new interval  infarct.    Leota Jacobsen, MD  08/11/2022 12:51 PM   CT Head WO Contrast    Narrative    HISTORY: Altered mental status.    COMPARISON: 07/26/2022.    TECHNIQUE: CT of the head performed without intravenous  contrast.  Multiplanar reformatted images were created and reviewed. The following  dose reduction techniques were utilized: automated exposure control and/or  adjustment of the mA and/or KV according to patient size, and the use of an  iterative reconstruction technique.    CONTRAST: None.    FINDINGS:  Redemonstrated multiple areas of subacute infarction with cortical  hyperdensities which favor petechial hemorrhage in the right frontal lobe  and bilateral parietal lobes. These are similar to the prior examination on  07/26/2022.    No evidence of new or progressive acute infarct. No mass effect. No  evidence of new or progressive acute intracranial hemorrhage. No  extra-axial collection is seen.    Stable volume loss and hypodensities in the white matter consistent with  chronic ischemic changes.      Impression       Redemonstrated multiple areas of subacute infarction with cortical  hyperdensities which favor petechial hemorrhage in the right frontal lobe  and bilateral parietal lobes. These are similar to the prior examination on  07/26/2022. No evidence of new or progressive acute infarct. No mass  effect.    Vernie Murders, MD  08/11/2022 12:00 AM       Signed,  Donzetta Sprung, MD  3:34 PM 08/14/2022

## 2022-08-14 NOTE — Plan of Care (Signed)
Problem: Moderate/High Fall Risk Score >5  Goal: Patient will remain free of falls  Outcome: Progressing  Flowsheets (Taken 08/09/2022 0800 by Wess Botts, RN)  Moderate Risk (6-13):   MOD-Apply bed exit alarm if patient is confused   MOD-Floor mat at bedside (where available) if appropriate     Problem: Compromised Sensory Perception  Goal: Sensory Perception Interventions  Outcome: Progressing     Problem: Compromised Moisture  Goal: Moisture level Interventions  Outcome: Progressing  Flowsheets (Taken 08/14/2022 0400)  Moisture level Interventions: Moisture wicking products, Moisture barrier cream     Problem: Compromised Activity/Mobility  Goal: Activity/Mobility Interventions  Outcome: Progressing  Flowsheets (Taken 08/14/2022 0400)  Activity/Mobility Interventions: Pad bony prominences, TAP Seated positioning system when OOB, Promote PMP, Reposition q 2 hrs / turn clock, Offload heels     Problem: Compromised Nutrition  Goal: Nutrition Interventions  Outcome: Progressing  Flowsheets (Taken 08/14/2022 0400)  Nutrition Interventions: Discuss nutrition at Rounds, I&Os, Document % meal eaten, Daily weights     Problem: Compromised Friction/Shear  Goal: Friction and Shear Interventions  Outcome: Progressing  Flowsheets (Taken 08/14/2022 0400)  Friction and Shear Interventions: Pad bony prominences, Off load heels, HOB 30 degrees or less unless contraindicated, Consider: TAP seated positioning, Heel foams     Problem: Inadequate Airway Clearance  Goal: Patent Airway maintained  Outcome: Progressing  Flowsheets (Taken 08/12/2022 1848 by Iona Beard, RN)  Patent airway maintained:   Position patient for maximum ventilatory efficiency   Suction secretions as needed   Reposition patient every 2 hours and as needed unless able to self-reposition  Goal: Normal respiratory rate/effort achieved/maintained  Outcome: Progressing  Flowsheets (Taken 08/12/2022 1848 by Iona Beard, RN)  Normal respiratory rate/effort  achieved/maintained: Plan activities to conserve energy: plan rest periods     Problem: Infection Prevention  Goal: Free from infection  Outcome: Progressing  Flowsheets (Taken 08/12/2022 1848 by Iona Beard, RN)  Free from infection:   Monitor/assess vital signs   Encourage/assist patient to turn, cough and perform deep breathing every 2 hours   Assess incision for evidence of healing   Monitor/assess output from surgical drain if present   Monitor/assess lab values and report abnormal values   Assess for signs and symptoms of infection     Problem: Impaired Mobility  Goal: Mobility/Activity is maintained at optimal level for patient  Outcome: Progressing  Flowsheets (Taken 08/12/2022 1848 by Iona Beard, RN)  Mobility/activity is maintained at optimal level for patient:   Encourage independent activity per ability   Consult/collaborate with Physical Therapy and/or Occupational Therapy     Problem: Constipation  Goal: Fluid and electrolyte balance are achieved/maintained  Outcome: Progressing  Flowsheets (Taken 08/13/2022 1158 by Iona Beard, RN)  Fluid and electrolyte balance are achieved/maintained:   Monitor/assess lab values and report abnormal values   Assess and reassess fluid and electrolyte status   Observe for cardiac arrhythmias   Monitor for muscle weakness  Goal: Elimination patterns are normal or improving  Outcome: Progressing  Flowsheets (Taken 08/08/2022 1803 by Wess Botts, RN)  Elimination patterns are normal or improving: Assess for and discuss C. diff screening with LIP     Problem: Safety  Goal: Patient will be free from injury during hospitalization  Outcome: Progressing  Flowsheets (Taken 08/12/2022 1848 by Iona Beard, RN)  Patient will be free from injury during hospitalization:   Assess patient's risk for falls and implement fall prevention plan of care per policy   Provide and maintain safe environment  Use appropriate transfer methods   Ensure appropriate safety devices are  available at the bedside   Include patient/ family/ care giver in decisions related to safety   Hourly rounding   Assess for patients risk for elopement and implement Elopement Risk Plan per policy   Provide alternative method of communication if needed (communication boards, writing)  Goal: Patient will be free from infection during hospitalization  Outcome: Progressing  Flowsheets (Taken 08/11/2022 0500 by Mar Daring, RN)  Free from Infection during hospitalization:   Assess and monitor for signs and symptoms of infection   Monitor lab/diagnostic results   Monitor all insertion sites (i.e. indwelling lines, tubes, urinary catheters, and drains)   Encourage patient and family to use good hand hygiene technique     Problem: Pain  Goal: Pain at adequate level as identified by patient  Outcome: Progressing  Flowsheets (Taken 08/13/2022 0800 by Iona Beard, RN)  Pain at adequate level as identified by patient:   Identify patient comfort function goal   Assess for risk of opioid induced respiratory depression, including snoring/sleep apnea. Alert healthcare team of risk factors identified.   Assess pain on admission, during daily assessment and/or before any "as needed" intervention(s)   Reassess pain within 30-60 minutes of any procedure/intervention, per Pain Assessment, Intervention, Reassessment (AIR) Cycle   Evaluate if patient comfort function goal is met   Evaluate patient's satisfaction with pain management progress   Offer non-pharmacological pain management interventions   Consult/collaborate with Physical Therapy, Occupational Therapy, and/or Speech Therapy   Include patient/patient care companion in decisions related to pain management as needed     Problem: Side Effects from Pain Analgesia  Goal: Patient will experience minimal side effects of analgesic therapy  Outcome: Progressing  Flowsheets (Taken 08/13/2022 0800 by Iona Beard, RN)  Patient will experience minimal side effects of analgesic  therapy:   Monitor/assess patient's respiratory status (RR depth, effort, breath sounds)   Assess for changes in cognitive function   Prevent/manage side effects per LIP orders (i.e. nausea, vomiting, pruritus, constipation, urinary retention, etc.)   Evaluate for opioid-induced sedation with appropriate assessment tool (i.e. POSS)     Problem: Hemodynamic Status: Cardiac  Goal: Stable vital signs and fluid balance  Outcome: Progressing  Flowsheets (Taken 08/13/2022 1158 by Iona Beard, RN)  Stable vital signs and fluid balance:   Assess signs and symptoms associated with cardiac rhythm changes   Monitor lab values     Problem: Renal Instability  Goal: Fluid and electrolyte balance are achieved/maintained  Outcome: Progressing  Flowsheets (Taken 08/13/2022 1158 by Iona Beard, RN)  Fluid and electrolyte balance are achieved/maintained:   Monitor/assess lab values and report abnormal values   Assess and reassess fluid and electrolyte status   Observe for cardiac arrhythmias   Monitor for muscle weakness   Follow fluid restrictions/IV/PO parameters     Problem: Patient Receiving Advanced Renal Therapies  Goal: Therapy access site remains intact  Outcome: Progressing  Flowsheets (Taken 08/08/2022 1803 by Wess Botts, RN)  Therapy access site remains intact:   Assess therapy access site   Change therapy access site dressing as needed     Problem: Fluid and Electrolyte Imbalance/ Endocrine  Goal: Fluid and electrolyte balance are achieved/maintained  Outcome: Progressing  Flowsheets (Taken 08/13/2022 1158 by Iona Beard, RN)  Fluid and electrolyte balance are achieved/maintained:   Monitor/assess lab values and report abnormal values   Assess and reassess fluid and electrolyte status   Observe for cardiac arrhythmias  Monitor for muscle weakness  Goal: Adequate hydration  Outcome: Progressing  Flowsheets (Taken 08/05/2022 1255 by Wess Botts, RN)  Adequate hydration:   Assess mucus membranes, skin color,  turgor, perfusion and presence of edema   Assess for peripheral, sacral, periorbital and abdominal edema   Monitor and assess vital signs and perfusion     Problem: Diabetes: Glucose Imbalance  Goal: Blood glucose stable at established goal  Outcome: Progressing  Flowsheets (Taken 08/12/2022 1848 by Iona Beard, RN)  Blood glucose stable at established goal:   Monitor lab values   Monitor intake and output.  Notify LIP if urine output is < 30 mL/hour.   Follow fluid restrictions/IV/PO parameters   Include patient/family in decisions related to nutrition/dietary selections   Assess for hypoglycemia /hyperglycemia   Monitor/assess vital signs   Coordinate medication administration with meals, as indicated   Ensure appropriate diet and assess tolerance   Ensure adequate hydration   Ensure appropriate consults are obtained (Nutrition, Diabetes Education, and Case Management)   Ensure patient/family has adequate teaching materials     Problem: Inadequate Gas Exchange  Goal: Patent Airway maintained  Outcome: Progressing  Flowsheets (Taken 08/12/2022 1848 by Iona Beard, RN)  Patent airway maintained:   Position patient for maximum ventilatory efficiency   Suction secretions as needed   Reposition patient every 2 hours and as needed unless able to self-reposition  Goal: Adequate oxygenation and improved ventilation  Outcome: Progressing  Flowsheets (Taken 08/09/2022 2130 by Wess Botts, RN)  Adequate oxygenation and improved ventilation:   Assess lung sounds   Monitor SpO2 and treat as needed   Monitor and treat ETCO2   Provide mechanical and oxygen support to facilitate gas exchange   Position for maximum ventilatory efficiency   Teach/reinforce use of incentive spirometer 10 times per hour while awake, cough and deep breath as needed   Increase activity as tolerated/progressive mobility   Plan activities to conserve energy: plan rest periods     Problem: Neurological Deficit  Goal: Neurological status is stable  or improving  Outcome: Progressing  Flowsheets (Taken 08/13/2022 0132 by Lia Hopping, RN)  Neurological status is stable or improving:   Monitor/assess/document neurological assessment (Stroke: every 4 hours)   Monitor/assess NIH Stroke Scale   Re-assess NIH Stroke Scale for any change in status   Observe for seizure activity and initiate seizure precautions if indicated   Perform CAM Assessment     Problem: Potential for Aspiration  Goal: Risk of aspiration will be minimized  Outcome: Progressing  Flowsheets (Taken 08/09/2022 2130 by Wess Botts, RN)  Risk of aspiration will be minimized:   Place swallow precaution signage above bed and supervise patient during oral intake   Assess/monitor ability to swallow using dysphagia screen: Keep patient NPO if patient fails screening   Monitor/assess for signs of aspiration (tachypnea, cough, wheezing, clearing throat, hoarseness after eating, decrease in SaO2)   Consult/collaborate/follow recommended modified texture diet/thicken liquids as indicated by Speech Pathologist   Place patient up in chair to eat, if possible/head of bed up 90 degrees to eat if unable to be out of bed   Instruct patient to take small bites, small single sips of liquid, and do not use a straw     Problem: Every Day - Stroke  Goal: Mobility/Activity is maintained at optimal level for patient  Outcome: Progressing  Flowsheets (Taken 08/12/2022 1848 by Iona Beard, RN)  Mobility/activity is maintained at optimal level for patient:   Encourage independent activity  per ability   Consult/collaborate with Physical Therapy and/or Occupational Therapy  Goal: Elimination patterns are normal or improving  Outcome: Progressing  Flowsheets (Taken 08/08/2022 1803 by Wess Botts, RN)  Elimination patterns are normal or improving: Assess for and discuss C. diff screening with LIP  Goal: Neurological status is stable or improving  Outcome: Progressing  Flowsheets (Taken 08/13/2022 0132 by Lia Hopping, RN)  Neurological status is stable or improving:   Monitor/assess/document neurological assessment (Stroke: every 4 hours)   Monitor/assess NIH Stroke Scale   Re-assess NIH Stroke Scale for any change in status   Observe for seizure activity and initiate seizure precautions if indicated   Perform CAM Assessment  Goal: Core/Quality measure requirements - Daily  Outcome: Progressing  Flowsheets (Taken 08/11/2022 1024 by Julien Girt, RN)  Core/Quality measure requirements - Daily:   VTE Prevention: Ensure anticoagulant(s) administered and/or anti-embolism stockings/devices documented by end of day 2   Ensure antithrombotic administered or contraindication documented by LIP by end of day 2   Once lipid panel has resulted, check LDL. Contact provider for statin order if LDL > 70 (or ensure contraindication documented by LIP).   Continue stroke education (must include Modifiable Risk Factors, Warning Signs and Symptoms of Stroke, Activation of Emergency Medical System and Follow-up Appointments). Ensure handout has been given and documented.  Goal: Stable vital signs and fluid balance  Outcome: Progressing  Flowsheets (Taken 08/11/2022 1024 by Julien Girt, RN)  Stable vital signs and fluid balance:   Position patient for maximum circulation/cardiac output   Monitor and assess vitals every 4 hours or as ordered and hemodynamic parameters   Monitor intake and output. Notify LIP if urine output is < 30 mL/hour.   Encourage oral fluid intake   Apply telemetry monitor as ordered  Goal: Patient will maintain adequate oxygenation  Outcome: Progressing  Flowsheets (Taken 08/11/2022 1024 by Julien Girt, RN)  Patient will maintain adequate oxygenation:   Suction secretions as needed   Maintain SpO2 of greater than 92%  Goal: Patient's risk of aspiration will be minimized  Outcome: Progressing  Flowsheets (Taken 08/11/2022 1024 by Julien Girt, RN)  Patient's risk of aspiration will be minimized:   Complete new dysphagia  screen for any change in status: Keep patient NPO if patient fails   Monitor/assess for signs of aspiration (tachypnea, cough, wheezing, clearing throat, hoarseness after eating, decrease in SaO2   Place swallow precaution signage above bed   HOB up 90 degrees to eat if unable to be OOB  Goal: Nutritional intake is adequate  Outcome: Progressing  Flowsheets (Taken 08/11/2022 1024 by Julien Girt, RN)  Nutritional intake is adequate:   Consult/collaborate with Speech Therapy (swallow evaluations)   Consult/collaborate with Clinical Nutritionist  Goal: Skin integrity is maintained or improved  Outcome: Progressing  Flowsheets (Taken 08/12/2022 1848 by Iona Beard, RN)  Skin integrity is maintained or improved:   Assess Braden Scale every shift   Turn or reposition patient every 2 hours or as needed unless able to reposition self   Keep skin clean and dry   Avoid shearing   Encourage use of lotion/moisturizer on skin   Monitor patient's hygiene practices   Collaborate with Wound, Ostomy, and Continence Nurse   Increase activity as tolerated/progressive mobility   Relieve pressure to bony prominences   Utilize specialty bed   Keep head of bed 30 degrees or less (unless contraindicated)  Goal: Neurovascular status is stable or improving  Outcome: Progressing  Flowsheets (Taken 08/11/2022  1024 by Julien Girt, RN)  Neurovascular status is stable or improving:   Monitor/assess neurovascular status (pulses, capillary refill, pain, paresthesia, presence of edema)   Monitor/assess for signs of Venous Thrombus Emboli (edema of calf/thigh redness, pain)  Goal: Effective coping demonstrated  Outcome: Progressing  Flowsheets (Taken 08/11/2022 1024 by Julien Girt, RN)  Effective coping demonstrated: Offer reassurance to decrease anxiety  Goal: Will be able to express needs and understand communication  Outcome: Progressing  Flowsheets (Taken 08/09/2022 2130 by Wess Botts, RN)  Able to express needs and understand  communication:   Provide alternative method of communication if needed   Consult/collaborate with Speech Language Pathology (SLP)   Consult/collaborate with Case Management/Social Work   Include patient care companion in decisions related to communication   Patient/patient care companion demonstrates understanding on disease process, treatment plan, medications and discharge plan     Problem: Pain interferes with ability to perform ADL  Goal: Pain at adequate level as identified by patient  Outcome: Progressing  Flowsheets (Taken 08/13/2022 0800 by Iona Beard, RN)  Pain at adequate level as identified by patient:   Identify patient comfort function goal   Assess for risk of opioid induced respiratory depression, including snoring/sleep apnea. Alert healthcare team of risk factors identified.   Assess pain on admission, during daily assessment and/or before any "as needed" intervention(s)   Reassess pain within 30-60 minutes of any procedure/intervention, per Pain Assessment, Intervention, Reassessment (AIR) Cycle   Evaluate if patient comfort function goal is met   Evaluate patient's satisfaction with pain management progress   Offer non-pharmacological pain management interventions   Consult/collaborate with Physical Therapy, Occupational Therapy, and/or Speech Therapy   Include patient/patient care companion in decisions related to pain management as needed     Problem: Everyday - Heart Failure  Goal: Mobility/Activity is Maintained at Optimal Level for Patient  Outcome: Progressing  Flowsheets (Taken 08/11/2022 0500 by Mar Daring, RN)  Mobility/Activity is Maintained at Optimal Level for Patient:   Increase mobility as tolerated/progressive mobility protocol   Maintain SCD's as Ordered   Perform active/passive ROM   Reposition patient every 2 hours and as needed unless able to reposition self   Assess for changes in respiratory status, level of consciousness and/or development of fatigue   Consult with  Physical Therapy and/or Occupational Therapy  Goal: Nutritional Intake is Adequate  Outcome: Progressing  Flowsheets (Taken 08/05/2022 1255 by Wess Botts, RN)  Nutritional Intake is Adequate:   Fluid Restricction if needed   Consult/Collaborate with Nutritionist   Patient and family teaching on low sodium diet   Cardiac diet-2 gm Sodium   Assess appetite,anorexia and amount of meal/food tolerated   Encourage/perform oral hygiene as appropriate

## 2022-08-14 NOTE — Progress Notes (Signed)
4 eyes in 4 hours pressure injury assessment note:      Completed with: Brad  Unit & Time admitted:              Bony Prominences: Check appropriate box; if wound is present enter wound assessment in LDA     Occiput:                 [x] WNL  []  Wound present  Face:                     [x] WNL  []  Wound present  Ears:                      [x] WNL  []  Wound present  Spine:                    [x] WNL  []  Wound present  Shoulders:             [x] WNL  []  Wound present  Elbows:                  [x] WNL  []  Wound present  Sacrum/coccyx:     [] WNL  [x]  Wound present  Ischial Tuberosity:  [x] WNL  []  Wound present  Trochanter/Hip:      [x] WNL  []  Wound present  Knees:                   [x] WNL  []  Wound present  Ankles:                   [x] WNL  []  Wound present  Heels:                    [x] WNL  []  Wound present  Other pressure areas:  []  Wound location       Device related: []  Device name:         LDA completed if wound present: yes/no  Consult WOCN if necessary    Other skin related issues, ie tears, rash, etc, document in Integumentary flowsheet

## 2022-08-14 NOTE — Progress Notes (Signed)
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 and  cystectomy on 06/28/22  - complicated by small bowel enterotomy and blood loss   New onset afib - on amiodarone and anticoagulation  High grade urothelial bladder CA: s/p bilateral nephrectomies and cystectomy c/b mall bowel enterotomy and blood loss (~1L) on 06/28/22  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 - remains low  - on aranesp  Encephalopathy - persistent - episode of unresponsive last night   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   HTN - BP labile    Recommendations:   HD today 1 L UF  Midodrine pre HD  Plan for discharge to [facility] facility - we will continue to follow her there     Case discussed with: patient, HD nurse     Debara Pickett, MD  [facility]  [phone] (office)  [phone] ([facility] Spectra-Link)      Subjective: awake, alert     Review of Systems:   No chest pain, no SOB    Physical Exam:     Vitals:    08/14/22 0100 08/14/22 0200 08/14/22 0400 08/14/22 0600   BP:  127/62     Pulse: 74 80     Resp: 21 (!) 23  22   Temp:   98.3 F (36.8 C)    TempSrc:   Axillary    SpO2: 98% 99%     Weight:   81.6 kg (179 lb 14.3 oz)    Height:           Intake and Output Summary (Last 24 hours) at Date Time    Intake/Output Summary (Last 24 hours) at 08/14/2022 0747  Last data filed at 08/13/2022 0800  Gross per 24 hour   Intake 100 ml   Output --   Net 100 ml         Genera: awake, alert   Cardiovascular: regular rate and rhythm  Lungs:  CTA B anteriorly without rhonchi or wheezing  Abdomen: soft, midline wound with clear dressing and VAC in place  Extremities: no edema, heel protectors in place  Access: R permacath     Meds:      Scheduled Meds: PRN Meds:    acetaminophen, 650 mg, Oral, 4 times per day   Or  acetaminophen, 650 mg, Oral, 4 times per day  amiodarone, 200 mg, per NG tube, Daily  apixaban, 2.5 mg, Oral, Q12H SCH  atorvastatin, 40 mg, Oral, QHS  b complex-vitamin c-folic acid, 1 tablet, Oral, Daily  darbepoetin alfa, 0.45 mcg/kg, Subcutaneous, Weekly  lidocaine, 20 mL, Intradermal, Once  megestrol, 400 mg, Oral, Daily  midodrine, 10 mg, Oral, Q Tue/Thu/Sat (AM)  pantoprazole, 40 mg, per NG tube, Q12H SCH  polyethylene glycol, 17 g, Oral, Daily  senna-docusate, 1 tablet, Oral, QHS  thiamine, 100 mg, per NG tube, Daily  umeclidinium-vilanterol, 1 puff, Inhalation, QAM  Continuous Infusions:         sodium chloride, , PRN  acetaminophen, 650 mg, Q4H PRN   Or  acetaminophen, 650 mg, Q4H PRN   Or  acetaminophen, 650 mg, Q4H PRN  albuterol-ipratropium, 3 mL, Q6H PRN  benzocaine, 1 spray, TID PRN  calcium carbonate, 500 mg, Q4H PRN  dextrose, 15 g of glucose, PRN   Or  dextrose, 12.5 g, PRN   Or  dextrose, 12.5 g, PRN   Or  glucagon (rDNA), 1 mg, PRN  diphenhydrAMINE, 25 mg, Q6H PRN  hydrALAZINE, 10 mg, Q3H PRN  labetalol, 10 mg, Q15 Min PRN  lidocaine 2% jelly, , Once PRN  naloxone, 0.4 mg, PRN  ondansetron, 4 mg, Q6H PRN  phenol, 1 spray, Q2H PRN  traMADol, 50 mg, Q8H PRN              Labs:     Recent Labs   Lab 08/14/22  0531 08/13/22  0322 08/12/22  0521   WBC 14.58* 13.52* 11.02*   Hemoglobin 8.7* 7.3* 7.2*   Hematocrit 27.3* 23.1* 23.2*   Platelet Count 363* 363* 288     Recent Labs   Lab 08/14/22  0531 08/13/22  0322 08/12/22  0521   Sodium 144 142 143   Potassium 3.8 3.7 3.8   Chloride 102 103 105   CO2 25 28 23    BUN 29* 15 32*   Creatinine 4.0* 2.9* 4.7*   Calcium 8.9 8.2 8.7   Albumin 1.4* 1.4* 1.4*   Phosphorus 3.3 3.3 4.9*    Magnesium 1.8 1.7 1.8   Glucose 113* 96 91   GFR 10.5* 15.4* 8.6*           Signed by: Debara Pickett, MD

## 2022-08-14 NOTE — Progress Notes (Signed)
Update as follows:    CM left voicemail for PM&R [phone] for PT/OT Stat which is needed for auth. CM will continue to follow.    Kentrell Guettler,MSW,Supervisee in Marketing executive I

## 2022-08-14 NOTE — Progress Notes (Signed)
Pt is awake. Pt denied pain and sob. No other complain given. PT's BP low during tx, UF lowered and albumin 25% given as ordered.  R  permanent catheter access is optimally working post dialysis.  Hemodialysis ended and pt. able to finish 3.5hrs tx.  Able to removed Net Fluid of . Reports were given to Primary nurse.       08/14/22 1310   Treatment Summary   Time Off Machine 1300   Duration of Treatment (Hours) 3.5   Treatment Type 2:1   Dialyzer Clearance Heavily streaked   Fluid Volume Off (mL) 1100   Prime Volume (mL) 200   Rinseback Volume (mL) 200   Fluid Given: Normal Saline (mL) 0   Fluid Given: PRBC  0 mL   Fluid Given: Albumin (mL) 100   Fluid Given: Other (mL) 0   Total Fluid Given 500   Hemodialysis Net Fluid Removed 600   Post Treatment Assessment   Post-Treatment Weight (Kg)   (- .)   Patient Response to Treatment fairly tolerated   Additional Dialyzer Used 0   Permacath Catheter - Tunneled 07/14/22 Subclavian vein catheter Right   Placement Date/Time: 07/14/22 1404   Inserted by: Dr. Welton Flakes  Access Type: Subclavian vein catheter  Orientation: Right  Central Line Infection Prevention Education provided?: Yes  Hand Hygiene: Alcohol based hand scrub;Soap and water  Line cart u...   Line necessity reviewed? Apheresis/hemodialysis   Site Assessment Clean;Dry;Intact   Catheter Lumen Volume Venous 1.6 mL   Catheter Lumen Volume Arterial 1.6 mL   Dressing Status and Intervention Dressing Intact;Clean & Dry   Tego/Curos Caps on Catheter Yes   Dressing Type Transparent;Biopatch   Dressing Change Due 08/17/22   Vitals   Temp 97.7 F (36.5 C)   Heart Rate 77   Resp Rate 18   BP 108/57   SpO2 98 %   O2 Device None (Room air)   Assessment   Mental Status Alert;Restless   Cardiac Rhythm Normal sinus rhythm   Respiratory  WDL   Respiratory Pattern Return to WDL   Bilateral Breath Sounds Diminished   R Breath Sounds Diminished   L Breath Sounds Diminished   RLE Edema Non Pitting Edema   LLE Edema Non  Pitting Edema   General Skin Color Appropriate for ethnicity   Skin Temp Warm;Dry   Abdomen Inspection Rounded   Mobility Bed   Pain Assessment   Charting Type Reassessment   Pain Scale Used Numeric Scale (0-10)   Numeric Pain Scale   Pain Score 0   POSS Score 1   Education   Person taught Patient   Knowledge basis None   Topics taught Procedure   Teaching Tools Explain   Reponse No Evidence of Learning;Needs Reinforcement   Bedside Nurse Communication   Name of bedside RN - post dialysis Orbie Pyo, RN

## 2022-08-14 NOTE — Progress Notes (Signed)
Infectious Diseases & Tropical Medicine  Progress Note    08/14/2022   Shelby Barron MWU:[id number],ZDG:[id number] is a 82 y.o. female,       Assessment:     Encephalopathy  Acute CVA  Bladder cancer  Status post radical cystectomy/bilateral nephrectomy, enterectomy and lysis of adhesion (06/28/2022)  S/p small bowel resection  Abdominal wall wound s/p bedside debridement/wound VAC placement (08/02/2022)  End-stage renal disease on hemodialysis  Atrial fibrillation  Leukocytosis  Risk of aspiration    Plan:     Continue monitoring without antibiotics  Continue local wound care  Follow-up chest x-ray  Continue probiotics  Monitor electrolytes and renal functions closely  Monitor clinically  Discussed with nursing staff    ROS:     General: No fever, comfortable, receiving hemodialysis  Respiratory: No cough or shortness of breath  Gastrointestinal: No diarrhea per nursing staff, abdominal wall wound VAC in place  Genito-Urinary: no hematuria  Musculoskeletal: no edema  Neurological: awake  Dermatological: No rash    Physical Examination:     Blood pressure 127/62, pulse 80, temperature 98.3 F (36.8 C), temperature source Axillary, resp. rate 22, height 1.575 m (5\' 2" ), weight 81.6 kg (179 lb 14.3 oz), SpO2 99%.    General Appearance: Confused  HEENT: Pupils are equal, round, and reactive to light.   Lungs: Clear to auscultation  Heart: Regular rate and rhythm  Chest: Symmetric chest wall expansion.   Abdomen: soft, no hepatosplenomegaly, no bowel sounds, abdominal wall wound VAC in place  Neurological: awake  Extremities: No edema    Laboratory And Diagnostic Studies:     Recent Labs     08/14/22  0531 08/13/22  0322   WBC 14.58* 13.52*   Hemoglobin 8.7* 7.3*   Hematocrit 27.3* 23.1*   Platelet Count 363* 363*     Recent Labs     08/14/22  0531 08/13/22  0322   Sodium 144 142   Potassium 3.8 3.7   Chloride 102 103   CO2 25 28   BUN 29* 15   Creatinine 4.0* 2.9*   Glucose 113* 96   Calcium 8.9 8.2     Recent Labs      08/14/22  0531 08/13/22  0322   Albumin 1.4* 1.4*         Current Meds:      Scheduled Meds: PRN Meds:    acetaminophen, 650 mg, Oral, 4 times per day   Or  acetaminophen, 650 mg, Oral, 4 times per day  amiodarone, 200 mg, per NG tube, Daily  apixaban, 2.5 mg, Oral, Q12H SCH  atorvastatin, 40 mg, Oral, QHS  b complex-vitamin c-folic acid, 1 tablet, Oral, Daily  darbepoetin alfa, 0.45 mcg/kg, Subcutaneous, Weekly  lidocaine, 20 mL, Intradermal, Once  megestrol, 400 mg, Oral, Daily  midodrine, 10 mg, Oral, Q Tue/Thu/Sat (AM)  pantoprazole, 40 mg, per NG tube, Q12H SCH  polyethylene glycol, 17 g, Oral, Daily  senna-docusate, 1 tablet, Oral, QHS  thiamine, 100 mg, per NG tube, Daily  umeclidinium-vilanterol, 1 puff, Inhalation, QAM        Continuous Infusions:       sodium chloride, , PRN  acetaminophen, 650 mg, Q4H PRN   Or  acetaminophen, 650 mg, Q4H PRN   Or  acetaminophen, 650 mg, Q4H PRN  albuterol-ipratropium, 3 mL, Q6H PRN  benzocaine, 1 spray, TID PRN  calcium carbonate, 500 mg, Q4H PRN  dextrose, 15 g of glucose, PRN   Or  dextrose, 12.5 g, PRN  Or  dextrose, 12.5 g, PRN   Or  glucagon (rDNA), 1 mg, PRN  diphenhydrAMINE, 25 mg, Q6H PRN  hydrALAZINE, 10 mg, Q3H PRN  labetalol, 10 mg, Q15 Min PRN  lidocaine 2% jelly, , Once PRN  naloxone, 0.4 mg, PRN  ondansetron, 4 mg, Q6H PRN  phenol, 1 spray, Q2H PRN  traMADol, 50 mg, Q8H PRN          Shelby Barron A. Shelby Barron, M.D.  08/14/2022  7:30 AM

## 2022-08-14 NOTE — SLP Progress Note (Signed)
[facility]    Speech Therapy Treatment Note Attempt   Patient: Shelby Barron MRN#: [id number]   Unit: [place] CARDIOVASCULAR & NEURO INTENSIVE CARE Room/Bed: A2703/A2703-01       Attempted to see patient at approximately 09:59 am.     SLP Visit Cancellation Reason: Testing/Procedure. Pt in dialysis.   Will attempt again as schedule permits. Will continue to monitor for appropriateness.       08/14/2022   Claudie Revering., CCC-SLP  [id number] Language Pathology

## 2022-08-14 NOTE — Progress Notes (Signed)
Arrived in HD unit.  Pt is alert and awake. Pt denied pain and sob. R permanent catheter access is optimally working. Report received from primary RN pre-dialysis. Timeouts and safety checks done. Will closely monitor the pt.       08/14/22 0925   Bedside Nurse Communication   Name of bedside RN - pre dialysis Orbie Pyo, RN   Treatment Initiation- With Dialysis Precautions   Time Out/Safety Check Completed Yes   Consent for HD signed for this hospitalization (Date) 06/01/22   Consent for HD signed for this hospitalization (Time) 1516   Blood Consent Verified N/A   Dialysis Precautions All Connections Secured;Saline Line Double Clamped;Venous Parameters Set;Arterial Parameters Set;Air Foam Detecctor Engaged   Dialysis Treatment Type Routine   Is patient diabetic? Yes   RO/Hemodialysis Cabin crew   Is Total Chlorine less than 0.1 ppm? Yes   Orignial Total Chlorine Testing Time 0900   At 4 Hour Total Chlorine Testing Time 1300   RO/Hemodialysis Arts development officer Number 1    Machine Serial Number [id number]   RO # 24   RO Serial # [id number]   pH 7.2   Pressure Test Verified Yes   Alarms Verified Passed   Machine Temperature 98.6 F (37 C)   Alarms Verified Yes   Na+ mEq (Machine) 138 mEq   Bicarb mEq (Machine) 35 mEq   Hemodialysis Conductivity (Machine) 14.8   Hemodialysis Conductivity (Meter) 14.8   Dialyzer Lot Number [id number]   Tubing Lot Number [id number]   RO Machine Log Completed Yes   Hepatitis Status   HBsAg (Antigen) Result Negative   HBsAg Date Drawn 07/31/22   HBsAg Repeat Draw Due Date 08/28/22   Dialysis Weight   Pre-Treatment Weight (Kg) 86.5   Scale Type ICU Bed Scale   Vitals   Temp 97.8 F (36.6 C)   Heart Rate 89   Resp Rate 20   BP 106/54   SpO2 99 %   O2 Device None (Room air)   Assessment   Mental Status Alert;Restless;Disoriented   Cardiac Rhythm Normal sinus rhythm   Respiratory  WDL   Respiratory Pattern Return to WDL   Bilateral Breath Sounds Diminished   R Breath  Sounds Diminished   L Breath Sounds Diminished   RLE Edema Non Pitting Edema   LLE Edema Non Pitting Edema   General Skin Color Appropriate for ethnicity   Skin Temp Warm;Dry   Abdomen Inspection Rounded   Mobility Bed   Permacath Catheter - Tunneled 07/14/22 Subclavian vein catheter Right   Placement Date/Time: 07/14/22 1404   Inserted by: Dr. Welton Flakes  Access Type: Subclavian vein catheter  Orientation: Right  Central Line Infection Prevention Education provided?: Yes  Hand Hygiene: Alcohol based hand scrub;Soap and water  Line cart u...   Line necessity reviewed? Apheresis/hemodialysis   Site Assessment Clean;Dry;Intact   Catheter Lumen Volume Venous 1.6 mL   Catheter Lumen Volume Arterial 1.6 mL   Dressing Status and Intervention Dressing Intact;Clean & Dry   Tego/Curos Caps on Catheter Yes   Line Care Connections checked and tightened   Dressing Type Transparent;Biopatch   Dressing Status Clean;Dry;Intact   Dressing Change Due 08/17/22   Pain Assessment   Charting Type Assessment   Pain Scale Used Numeric Scale (0-10)   Numeric Pain Scale   Pain Score 0   POSS Score 1   Hemodialysis Comments   Pre-Hemodialysis Comments Timeout and safety checks done

## 2022-08-15 LAB — LAB USE ONLY - CBC WITH DIFFERENTIAL
Absolute Basophils: 0.06 10*3/uL (ref 0.00–0.08)
Absolute Eosinophils: 0.01 10*3/uL (ref 0.00–0.44)
Absolute Immature Granulocytes: 0.35 10*3/uL — ABNORMAL HIGH (ref 0.00–0.07)
Absolute Lymphocytes: 3.45 10*3/uL — ABNORMAL HIGH (ref 0.42–3.22)
Absolute Monocytes: 1.54 10*3/uL — ABNORMAL HIGH (ref 0.21–0.85)
Absolute Neutrophils: 9.57 10*3/uL — ABNORMAL HIGH (ref 1.10–6.33)
Absolute nRBC: 0 10*3/uL (ref <=0.00)
Basophils %: 0.4 %
Eosinophils %: 0.1 %
Hematocrit: 25.5 % — ABNORMAL LOW (ref 34.7–43.7)
Hemoglobin: 8.1 g/dL — ABNORMAL LOW (ref 11.4–14.8)
Immature Granulocytes %: 2.3 %
Lymphocytes %: 23 %
MCH: 29.6 pg (ref 25.1–33.5)
MCHC: 31.8 g/dL (ref 31.5–35.8)
MCV: 93.1 fL (ref 78.0–96.0)
MPV: 9.2 fL (ref 8.9–12.5)
Monocytes %: 10.3 %
Neutrophils %: 63.9 %
Platelet Count: 363 10*3/uL — ABNORMAL HIGH (ref 142–346)
Preliminary Absolute Neutrophil Count: 9.57 10*3/uL — ABNORMAL HIGH (ref 1.10–6.33)
RBC: 2.74 10*6/uL — ABNORMAL LOW (ref 3.90–5.10)
RDW: 15 % (ref 11–15)
WBC: 14.98 10*3/uL — ABNORMAL HIGH (ref 3.10–9.50)
nRBC %: 0 /100 WBC (ref <=0.0)

## 2022-08-15 LAB — WHOLE BLOOD GLUCOSE POCT
Whole Blood Glucose POCT: 96 mg/dL (ref 70–100)
Whole Blood Glucose POCT: 112 mg/dL — ABNORMAL HIGH (ref 70–100)
Whole Blood Glucose POCT: 140 mg/dL — ABNORMAL HIGH (ref 70–100)
Whole Blood Glucose POCT: 212 mg/dL — ABNORMAL HIGH (ref 70–100)

## 2022-08-15 LAB — RENAL FUNCTION PANEL
Albumin: 1.8 g/dL — ABNORMAL LOW (ref 3.5–5.0)
Anion Gap: 13 (ref 5.0–15.0)
BUN: 16 mg/dL (ref 7–21)
CO2: 26 mEq/L (ref 17–29)
Calcium: 8.8 mg/dL (ref 7.9–10.2)
Chloride: 103 mEq/L (ref 99–111)
Creatinine: 2.7 mg/dL — ABNORMAL HIGH (ref 0.4–1.0)
GFR: 16.8 mL/min/{1.73_m2} — ABNORMAL LOW (ref >=60.0)
Glucose: 103 mg/dL — ABNORMAL HIGH (ref 70–100)
Phosphorus: 2.3 mg/dL (ref 2.3–4.7)
Potassium: 3.9 mEq/L (ref 3.5–5.3)
Sodium: 142 mEq/L (ref 135–145)

## 2022-08-15 LAB — MAGNESIUM: Magnesium: 1.8 mg/dL (ref 1.6–2.6)

## 2022-08-15 LAB — CALCIUM, IONIZED: Calcium, Ionized: 2.38 mEq/L (ref 2.23–2.56)

## 2022-08-15 MED ORDER — MORPHINE SULFATE 2 MG/ML IJ/IV SOLN (WRAP)
1.0000 mg | Freq: Once | Status: AC
Start: 2022-08-15 — End: 2022-08-15
  Administered 2022-08-15: 1 mg via INTRAVENOUS
  Filled 2022-08-15: qty 1

## 2022-08-15 NOTE — Progress Notes (Signed)
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 s/p bilateral radical nephrectomies and  cystectomy on 06/28/22  - complicated by small bowel enterotomy and blood loss   New onset afib - on amiodarone and eliquis  High grade urothelial bladder CA: s/p bilateral nephrectomies and cystectomy c/b mall bowel enterotomy and blood loss (~1L) on 06/28/22  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 - remains low  - on aranesp  Encephalopathy - persistent - episode of unresponsive last night   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   HTN - BP labile    Recommendations:   HD next Tuesday  Consider starting standing midodrine  Plan for discharge to [facility] nursing facility - we will continue to follow her there     Case discussed with: patient    Debara Pickett, MD  [facility]  [phone] (office)  [phone] ([facility])      Subjective: awake, alert   HD yesterday 600 mL UF due to low BP    Review of Systems:   No chest pain, no SOB    Physical Exam:     Vitals:    08/14/22 2000 08/14/22 2200 08/15/22 0000 08/15/22 0400   BP: (!) 88/46 113/56 107/53    Pulse: 62 63 75    Resp: (!) 25  (!) 40    Temp: 99.4 F (37.4 C)  97.5 F (36.4 C) 98.1 F (36.7 C)   TempSrc: Axillary  Axillary Axillary   SpO2: 100% 97% 100%    Weight:    82.7 kg (182 lb 5.1 oz)   Height:           Intake and Output Summary (Last 24 hours) at Date Time    Intake/Output Summary (Last 24 hours) at 08/15/2022 0160  Last data filed at 08/14/2022 2000  Gross per 24 hour   Intake  612 ml   Output 600 ml   Net 12 ml         Genera: awake, alert   Cardiovascular: regular rate and rhythm  Lungs: CTA B anteriorly without rhonchi or wheezing  Abdomen: soft, midline wound with clear dressing and VAC in place  Extremities: no edema, heel protectors in place  Access: R permacath     Meds:      Scheduled Meds: PRN Meds:    acetaminophen, 650 mg, Oral, 4 times per day   Or  acetaminophen, 650 mg, Oral, 4 times per day  amiodarone, 200 mg, per NG tube, Daily  apixaban, 2.5 mg, Oral, Q12H SCH  atorvastatin, 40 mg, Oral, QHS  b complex-vitamin c-folic acid, 1 tablet, Oral, Daily  darbepoetin alfa, 0.45 mcg/kg, Subcutaneous, Weekly  lidocaine, 20 mL, Intradermal, Once  megestrol, 400 mg, Oral, Daily  midodrine, 10 mg, Oral, Q Tue/Thu/Sat (AM)  pantoprazole, 40 mg, per NG tube, Q12H SCH  polyethylene glycol, 17 g, Oral, Daily  senna-docusate, 1 tablet, Oral, QHS  thiamine, 100 mg, per NG tube, Daily  umeclidinium-vilanterol, 1 puff, Inhalation, QAM          Continuous Infusions:         sodium chloride, , PRN  acetaminophen, 650 mg, Q4H PRN   Or  acetaminophen, 650 mg, Q4H PRN   Or  acetaminophen, 650 mg, Q4H PRN  albuterol-ipratropium, 3 mL, Q6H PRN  benzocaine, 1 spray, TID PRN  calcium carbonate, 500 mg, Q4H PRN  dextrose, 15 g of glucose, PRN   Or  dextrose, 12.5 g, PRN   Or  dextrose, 12.5 g, PRN   Or  glucagon (rDNA), 1 mg, PRN  diphenhydrAMINE, 25 mg, Q6H PRN  hydrALAZINE, 10 mg, Q3H PRN  labetalol, 10 mg, Q15 Min PRN  lidocaine 2% jelly, , Once PRN  naloxone, 0.4 mg, PRN  ondansetron, 4 mg, Q6H PRN  phenol, 1 spray, Q2H PRN  traMADol, 50 mg, Q8H PRN              Labs:     Recent Labs   Lab 08/15/22  0309 08/14/22  0531 08/13/22  0322   WBC 14.98* 14.58* 13.52*   Hemoglobin 8.1* 8.7* 7.3*   Hematocrit 25.5* 27.3* 23.1*   Platelet Count 363* 363* 363*     Recent Labs   Lab 08/15/22  0309 08/14/22  0531 08/13/22  0322   Sodium 142 144 142   Potassium 3.9 3.8 3.7   Chloride 103 102 103   CO2 26 25 28     BUN 16 29* 15   Creatinine 2.7* 4.0* 2.9*   Calcium 8.8 8.9 8.2   Albumin 1.8* 1.4* 1.4*   Phosphorus 2.3 3.3 3.3   Magnesium 1.8 1.8 1.7   Glucose 103* 113* 96   GFR 16.8* 10.5* 15.4*           Signed by: Debara Pickett, MD

## 2022-08-15 NOTE — Plan of Care (Addendum)
Patient alert and oriented to self and place, reoriented to time, vital signs stable on room air, turned per turning schedule. Wound vac intact. CHG bath and oral hygiene done. Daughter wants to speak with wound nurse during the day. Daughter updated over night and held phone to patient for daughter to speak to her and update each other. Meds given per order. Plan of care ongoing.     Problem: Moderate/High Fall Risk Score >5  Goal: Patient will remain free of falls  Outcome: Progressing  Flowsheets  Taken 08/15/2022 2000 by Richrd Sox., RN  Moderate Risk (6-13):   MOD-Consider activation of bed alarm if appropriate   MOD-Apply bed exit alarm if patient is confused   MOD-Remain with patient during toileting   MOD-Re-orient confused patients   MOD-Utilize diversion activities   MOD-Perform dangle, stand, walk (DSW) prior to mobilization  High (Greater than 13):   HIGH-Bed alarm on at all times while patient in bed   HIGH-Consider use of low bed   HIGH-Initiate use of floor mats as appropriate  Taken 08/15/2022 1957 by Davy Pique, RN  VH Moderate Risk (6-13):   INITIATE YELLOW "FALL RISK" SIGNAGE   YELLOW "FALL RISK" ARM BAND   USE OF BED EXIT ALARM IF PATIENT IS CONFUSED OR IMPULSIVE. PLACE RESET BED ALARM SIGN ABOVE BED   Remain with patient during toileting   Include family/significant other in multidisciplinary discussion regarding plan of care as appropriate   PLACE FALL RISK LEVEL ON WHITE BOARD FOR COMMUNICATION PURPOSES IN PATIENT'S ROOM   YELLOW NON-SKID SLIPPERS   Use chair-pad alarm device   Use of floor mat   Use of "STOP ask for help" sign  VH High Risk (Greater than 13):   BED ALARM WILL BE ACTIVATED WHEN THE PATEINT IS IN BED WITH SIGNAGE "RESET BED ALARM"   RED "HIGH FALL RISK" SIGNAGE   ALL REQUIRED MODERATE INTERVENTIONS   PATIENT IS TO BE SUPERVISED FOR ALL TOILETING ACTIVITIES   Keep door open for better visibility   Use of roll guard   Use chair-pad alarm device   Use assistive  devices   Request PT/OT therapy consult order from physician for patients with gait/mobility impairment     Problem: Compromised Sensory Perception  Goal: Sensory Perception Interventions  Outcome: Progressing  Flowsheets (Taken 08/15/2022 2000)  Sensory Perception Interventions: Offload heels, Pad bony prominences, Reposition q 2hrs/turn Clock, Q2 hour skin assessment under devices if present     Problem: Compromised Moisture  Goal: Moisture level Interventions  Outcome: Progressing  Flowsheets (Taken 08/15/2022 2000)  Moisture level Interventions: Moisture wicking products, Moisture barrier cream     Problem: Compromised Activity/Mobility  Goal: Activity/Mobility Interventions  Outcome: Progressing  Flowsheets (Taken 08/15/2022 2000)  Activity/Mobility Interventions: Pad bony prominences, TAP Seated positioning system when OOB, Promote PMP, Reposition q 2 hrs / turn clock, Offload heels     Problem: Compromised Nutrition  Goal: Nutrition Interventions  Outcome: Progressing  Flowsheets (Taken 08/15/2022 2000)  Nutrition Interventions: Discuss nutrition at Rounds, I&Os, Document % meal eaten, Daily weights     Problem: Compromised Friction/Shear  Goal: Friction and Shear Interventions  Outcome: Progressing  Flowsheets (Taken 08/15/2022 2000)  Friction and Shear Interventions: Pad bony prominences, Off load heels, HOB 30 degrees or less unless contraindicated, Consider: TAP seated positioning, Heel foams     Problem: Inadequate Airway Clearance  Goal: Patent Airway maintained  Outcome: Progressing  Flowsheets (Taken 08/15/2022 1957 by Davy Pique, RN)  Patent airway  maintained:   Reinforce use of ordered respiratory interventions (i.e. CPAP, BiPAP, Incentive Spirometer, Acapella, etc.)   Suction secretions as needed   Reposition patient every 2 hours and as needed unless able to self-reposition  Goal: Normal respiratory rate/effort achieved/maintained  Outcome: Progressing  Flowsheets (Taken 08/15/2022 1957 by  Davy Pique, RN)  Normal respiratory rate/effort achieved/maintained: Plan activities to conserve energy: plan rest periods     Problem: Infection Prevention  Goal: Free from infection  Outcome: Progressing  Flowsheets (Taken 08/15/2022 1957 by Davy Pique, RN)  Free from infection:   Assess incision for evidence of healing   Monitor/assess output from surgical drain if present   Monitor/assess vital signs     Problem: Impaired Mobility  Goal: Mobility/Activity is maintained at optimal level for patient  Outcome: Progressing  Flowsheets (Taken 08/15/2022 1957 by Davy Pique, RN)  Mobility/activity is maintained at optimal level for patient:   Encourage independent activity per ability   Consult/collaborate with Physical Therapy and/or Occupational Therapy     Problem: Constipation  Goal: Fluid and electrolyte balance are achieved/maintained  Outcome: Progressing  Flowsheets (Taken 08/15/2022 1957 by Davy Pique, RN)  Fluid and electrolyte balance are achieved/maintained:   Monitor/assess lab values and report abnormal values   Observe for cardiac arrhythmias   Assess and reassess fluid and electrolyte status   Monitor for muscle weakness  Goal: Elimination patterns are normal or improving  Outcome: Progressing  Flowsheets (Taken 08/08/2022 1803 by Wess Botts, RN)  Elimination patterns are normal or improving: Assess for and discuss C. diff screening with LIP     Problem: Safety  Goal: Patient will be free from injury during hospitalization  Outcome: Progressing  Flowsheets (Taken 08/15/2022 1957 by Davy Pique, RN)  Patient will be free from injury during hospitalization:   Include patient/ family/ care giver in decisions related to safety   Use appropriate transfer methods   Ensure appropriate safety devices are available at the bedside   Assess for patients risk for elopement and implement Elopement Risk Plan per policy   Hourly rounding  Goal: Patient will be free from infection  during hospitalization  Outcome: Progressing  Flowsheets (Taken 08/15/2022 1957 by Davy Pique, RN)  Free from Infection during hospitalization:   Monitor all insertion sites (i.e. indwelling lines, tubes, urinary catheters, and drains)   Assess and monitor for signs and symptoms of infection   Monitor lab/diagnostic results     Problem: Pain  Goal: Pain at adequate level as identified by patient  Outcome: Progressing  Flowsheets (Taken 08/15/2022 2000)  Pain at adequate level as identified by patient:   Assess for risk of opioid induced respiratory depression, including snoring/sleep apnea. Alert healthcare team of risk factors identified.   Identify patient comfort function goal   Assess pain on admission, during daily assessment and/or before any "as needed" intervention(s)   Reassess pain within 30-60 minutes of any procedure/intervention, per Pain Assessment, Intervention, Reassessment (AIR) Cycle   Evaluate patient's satisfaction with pain management progress   Include patient/patient care companion in decisions related to pain management as needed     Problem: Side Effects from Pain Analgesia  Goal: Patient will experience minimal side effects of analgesic therapy  Outcome: Progressing  Flowsheets (Taken 08/15/2022 2000)  Patient will experience minimal side effects of analgesic therapy:   Monitor/assess patient's respiratory status (RR depth, effort, breath sounds)   Assess for changes in cognitive function   Prevent/manage side effects per LIP orders (i.e. nausea, vomiting, pruritus,  constipation, urinary retention, etc.)     Problem: Hemodynamic Status: Cardiac  Goal: Stable vital signs and fluid balance  Outcome: Progressing  Flowsheets (Taken 08/15/2022 1957 by Davy Pique, RN)  Stable vital signs and fluid balance:   Assess signs and symptoms associated with cardiac rhythm changes   Monitor lab values     Problem: Renal Instability  Goal: Fluid and electrolyte balance are  achieved/maintained  Outcome: Progressing  Flowsheets (Taken 08/15/2022 0031 by Paul Half, RN)  Fluid and electrolyte balance are achieved/maintained:   Monitor/assess lab values and report abnormal values   Assess and reassess fluid and electrolyte status   Observe for cardiac arrhythmias   Monitor for muscle weakness   Follow fluid restrictions/IV/PO parameters     Problem: Patient Receiving Advanced Renal Therapies  Goal: Therapy access site remains intact  Outcome: Progressing  Flowsheets (Taken 08/14/2022 0946 by Bunnie Philips, RN)  Therapy access site remains intact:   Assess therapy access site   Change therapy access site dressing as needed     Problem: Fluid and Electrolyte Imbalance/ Endocrine  Goal: Fluid and electrolyte balance are achieved/maintained  Outcome: Progressing  Flowsheets (Taken 08/15/2022 1957 by Davy Pique, RN)  Fluid and electrolyte balance are achieved/maintained:   Monitor/assess lab values and report abnormal values   Observe for cardiac arrhythmias   Assess and reassess fluid and electrolyte status   Monitor for muscle weakness  Goal: Adequate hydration  Outcome: Progressing  Flowsheets (Taken 08/05/2022 1255 by Wess Botts, RN)  Adequate hydration:   Assess mucus membranes, skin color, turgor, perfusion and presence of edema   Assess for peripheral, sacral, periorbital and abdominal edema   Monitor and assess vital signs and perfusion     Problem: Diabetes: Glucose Imbalance  Goal: Blood glucose stable at established goal  Outcome: Progressing  Flowsheets (Taken 08/12/2022 1848 by Iona Beard, RN)  Blood glucose stable at established goal:   Monitor lab values   Monitor intake and output.  Notify LIP if urine output is < 30 mL/hour.   Follow fluid restrictions/IV/PO parameters   Include patient/family in decisions related to nutrition/dietary selections   Assess for hypoglycemia /hyperglycemia   Monitor/assess vital signs   Coordinate medication administration with  meals, as indicated   Ensure appropriate diet and assess tolerance   Ensure adequate hydration   Ensure appropriate consults are obtained (Nutrition, Diabetes Education, and Case Management)   Ensure patient/family has adequate teaching materials     Problem: Inadequate Gas Exchange  Goal: Patent Airway maintained  Outcome: Progressing  Flowsheets (Taken 08/15/2022 1957 by Davy Pique, RN)  Patent airway maintained:   Reinforce use of ordered respiratory interventions (i.e. CPAP, BiPAP, Incentive Spirometer, Acapella, etc.)   Suction secretions as needed   Reposition patient every 2 hours and as needed unless able to self-reposition  Goal: Adequate oxygenation and improved ventilation  Outcome: Progressing  Flowsheets (Taken 08/09/2022 2130 by Wess Botts, RN)  Adequate oxygenation and improved ventilation:   Assess lung sounds   Monitor SpO2 and treat as needed   Monitor and treat ETCO2   Provide mechanical and oxygen support to facilitate gas exchange   Position for maximum ventilatory efficiency   Teach/reinforce use of incentive spirometer 10 times per hour while awake, cough and deep breath as needed   Increase activity as tolerated/progressive mobility   Plan activities to conserve energy: plan rest periods     Problem: Neurological Deficit  Goal: Neurological status is stable or improving  Outcome: Progressing  Flowsheets (Taken 08/13/2022 0132 by Lia Hopping, RN)  Neurological status is stable or improving:   Monitor/assess/document neurological assessment (Stroke: every 4 hours)   Monitor/assess NIH Stroke Scale   Re-assess NIH Stroke Scale for any change in status   Observe for seizure activity and initiate seizure precautions if indicated   Perform CAM Assessment     Problem: Potential for Aspiration  Goal: Risk of aspiration will be minimized  Outcome: Progressing  Flowsheets (Taken 08/09/2022 2130 by Wess Botts, RN)  Risk of aspiration will be minimized:   Place swallow precaution  signage above bed and supervise patient during oral intake   Assess/monitor ability to swallow using dysphagia screen: Keep patient NPO if patient fails screening   Monitor/assess for signs of aspiration (tachypnea, cough, wheezing, clearing throat, hoarseness after eating, decrease in SaO2)   Consult/collaborate/follow recommended modified texture diet/thicken liquids as indicated by Speech Pathologist   Place patient up in chair to eat, if possible/head of bed up 90 degrees to eat if unable to be out of bed   Instruct patient to take small bites, small single sips of liquid, and do not use a straw     Problem: Every Day - Stroke  Goal: Mobility/Activity is maintained at optimal level for patient  Outcome: Progressing  Flowsheets (Taken 08/15/2022 1957 by Davy Pique, RN)  Mobility/activity is maintained at optimal level for patient:   Encourage independent activity per ability   Consult/collaborate with Physical Therapy and/or Occupational Therapy  Goal: Elimination patterns are normal or improving  Outcome: Progressing  Flowsheets (Taken 08/08/2022 1803 by Wess Botts, RN)  Elimination patterns are normal or improving: Assess for and discuss C. diff screening with LIP  Goal: Neurological status is stable or improving  Outcome: Progressing  Flowsheets (Taken 08/13/2022 0132 by Lia Hopping, RN)  Neurological status is stable or improving:   Monitor/assess/document neurological assessment (Stroke: every 4 hours)   Monitor/assess NIH Stroke Scale   Re-assess NIH Stroke Scale for any change in status   Observe for seizure activity and initiate seizure precautions if indicated   Perform CAM Assessment  Goal: Core/Quality measure requirements - Daily  Outcome: Progressing  Flowsheets (Taken 08/11/2022 1024 by Julien Girt, RN)  Core/Quality measure requirements - Daily:   VTE Prevention: Ensure anticoagulant(s) administered and/or anti-embolism stockings/devices documented by end of day 2   Ensure  antithrombotic administered or contraindication documented by LIP by end of day 2   Once lipid panel has resulted, check LDL. Contact provider for statin order if LDL > 70 (or ensure contraindication documented by LIP).   Continue stroke education (must include Modifiable Risk Factors, Warning Signs and Symptoms of Stroke, Activation of Emergency Medical System and Follow-up Appointments). Ensure handout has been given and documented.  Goal: Stable vital signs and fluid balance  Outcome: Progressing  Flowsheets (Taken 08/11/2022 1024 by Julien Girt, RN)  Stable vital signs and fluid balance:   Position patient for maximum circulation/cardiac output   Monitor and assess vitals every 4 hours or as ordered and hemodynamic parameters   Monitor intake and output. Notify LIP if urine output is < 30 mL/hour.   Encourage oral fluid intake   Apply telemetry monitor as ordered  Goal: Patient will maintain adequate oxygenation  Outcome: Progressing  Flowsheets (Taken 08/11/2022 1024 by Julien Girt, RN)  Patient will maintain adequate oxygenation:   Suction secretions as needed   Maintain SpO2 of greater than 92%  Goal: Patient's risk of aspiration will  be minimized  Outcome: Progressing  Flowsheets (Taken 08/11/2022 1024 by Julien Girt, RN)  Patient's risk of aspiration will be minimized:   Complete new dysphagia screen for any change in status: Keep patient NPO if patient fails   Monitor/assess for signs of aspiration (tachypnea, cough, wheezing, clearing throat, hoarseness after eating, decrease in SaO2   Place swallow precaution signage above bed   HOB up 90 degrees to eat if unable to be OOB  Goal: Skin integrity is maintained or improved  Outcome: Progressing  Flowsheets (Taken 08/12/2022 1848 by Iona Beard, RN)  Skin integrity is maintained or improved:   Assess Braden Scale every shift   Turn or reposition patient every 2 hours or as needed unless able to reposition self   Keep skin clean and dry   Avoid shearing    Encourage use of lotion/moisturizer on skin   Monitor patient's hygiene practices   Collaborate with Wound, Ostomy, and Continence Nurse   Increase activity as tolerated/progressive mobility   Relieve pressure to bony prominences   Utilize specialty bed   Keep head of bed 30 degrees or less (unless contraindicated)  Goal: Neurovascular status is stable or improving  Outcome: Progressing  Flowsheets (Taken 08/11/2022 1024 by Julien Girt, RN)  Neurovascular status is stable or improving:   Monitor/assess neurovascular status (pulses, capillary refill, pain, paresthesia, presence of edema)   Monitor/assess for signs of Venous Thrombus Emboli (edema of calf/thigh redness, pain)  Goal: Effective coping demonstrated  Outcome: Progressing  Flowsheets (Taken 08/11/2022 1024 by Julien Girt, RN)  Effective coping demonstrated: Offer reassurance to decrease anxiety  Goal: Will be able to express needs and understand communication  Outcome: Progressing  Flowsheets (Taken 08/09/2022 2130 by Wess Botts, RN)  Able to express needs and understand communication:   Provide alternative method of communication if needed   Consult/collaborate with Speech Language Pathology (SLP)   Consult/collaborate with Case Management/Social Work   Include patient care companion in decisions related to communication   Patient/patient care companion demonstrates understanding on disease process, treatment plan, medications and discharge plan     Problem: Pain interferes with ability to perform ADL  Goal: Pain at adequate level as identified by patient  Outcome: Progressing  Flowsheets (Taken 08/15/2022 2000)  Pain at adequate level as identified by patient:   Assess for risk of opioid induced respiratory depression, including snoring/sleep apnea. Alert healthcare team of risk factors identified.   Identify patient comfort function goal   Assess pain on admission, during daily assessment and/or before any "as needed" intervention(s)   Reassess pain  within 30-60 minutes of any procedure/intervention, per Pain Assessment, Intervention, Reassessment (AIR) Cycle   Evaluate patient's satisfaction with pain management progress   Include patient/patient care companion in decisions related to pain management as needed

## 2022-08-15 NOTE — Progress Notes (Signed)
[facility]  Progress Note    08/15/2022   Shelby Barron GMW:[id number]:[id number] is a 82 y.o. female,       Assessment:     Encephalopathy  Acute CVA  Bladder cancer  Status post radical cystectomy/bilateral nephrectomy, enterectomy and lysis of adhesion (06/28/2022)  S/p small bowel resection  Abdominal wall wound VAC in place  End-stage renal disease on hemodialysis  Atrial fibrillation  Risk of aspiration    Plan:     Continue monitoring without antibiotics  Wound care nurse following  Follow-up chest x-ray  Continue probiotics  Monitor electrolytes and renal functions closely  Monitor clinically  Discussed with nursing staff    ROS:     General: No fever, awake, confused  Respiratory: No cough or shortness of breath  Gastrointestinal: No diarrhea  Genito-Urinary: no hematuria  Musculoskeletal: no edema  Neurological: awake  Dermatological: No rash    Physical Examination:     Blood pressure 125/56, pulse 88, temperature 99 F (37.2 C), temperature source Oral, resp. rate (!) 46, height 1.575 m (5\' 2" ), weight 82.7 kg (182 lb 5.1 oz), SpO2 96%.    General Appearance: Comfortable  HEENT: Pupils are equal, round, and reactive to light.   Lungs: Clear to auscultation  Heart: Regular rate and rhythm  Chest: Symmetric chest wall expansion.   Abdomen: soft, no hepatosplenomegaly, no bowel sounds, abdominal wall wound VAC in place  Neurological: awake  Extremities: No edema    Laboratory And Diagnostic Studies:     Recent Labs     08/15/22  0309 08/14/22  0531   WBC 14.98* 14.58*   Hemoglobin 8.1* 8.7*   Hematocrit 25.5* 27.3*   Platelet Count 363* 363*     Recent Labs     08/15/22  0309 08/14/22  0531   Sodium 142 144   Potassium 3.9 3.8   Chloride 103 102   CO2 26 25   BUN 16 29*   Creatinine 2.7* 4.0*   Glucose 103* 113*   Calcium 8.8 8.9     Recent Labs     08/15/22  0309 08/14/22  0531   Albumin 1.8* 1.4*         Current Meds:      Scheduled Meds: PRN Meds:    acetaminophen, 650 mg,  Oral, 4 times per day   Or  acetaminophen, 650 mg, Oral, 4 times per day  amiodarone, 200 mg, per NG tube, Daily  apixaban, 2.5 mg, Oral, Q12H SCH  atorvastatin, 40 mg, Oral, QHS  b complex-vitamin c-folic acid, 1 tablet, Oral, Daily  darbepoetin alfa, 0.45 mcg/kg, Subcutaneous, Weekly  lidocaine, 20 mL, Intradermal, Once  megestrol, 400 mg, Oral, Daily  midodrine, 10 mg, Oral, Q Tue/Thu/Sat (AM)  pantoprazole, 40 mg, per NG tube, Q12H SCH  polyethylene glycol, 17 g, Oral, Daily  senna-docusate, 1 tablet, Oral, QHS  thiamine, 100 mg, per NG tube, Daily  umeclidinium-vilanterol, 1 puff, Inhalation, QAM        Continuous Infusions:       sodium chloride, , PRN  acetaminophen, 650 mg, Q4H PRN   Or  acetaminophen, 650 mg, Q4H PRN   Or  acetaminophen, 650 mg, Q4H PRN  albuterol-ipratropium, 3 mL, Q6H PRN  benzocaine, 1 spray, TID PRN  calcium carbonate, 500 mg, Q4H PRN  dextrose, 15 g of glucose, PRN   Or  dextrose, 12.5 g, PRN   Or  dextrose, 12.5 g, PRN   Or  glucagon (rDNA),  1 mg, PRN  diphenhydrAMINE, 25 mg, Q6H PRN  hydrALAZINE, 10 mg, Q3H PRN  labetalol, 10 mg, Q15 Min PRN  lidocaine 2% jelly, , Once PRN  naloxone, 0.4 mg, PRN  ondansetron, 4 mg, Q6H PRN  phenol, 1 spray, Q2H PRN  traMADol, 50 mg, Q8H PRN          Destyn Schuyler A. Janalyn Rouse, M.D.  08/15/2022  10:13 AM

## 2022-08-15 NOTE — Plan of Care (Signed)
NEURO: GCS E-4, M -5, V 4. . ANO X 3-4. FULL ROM TO LEFT ARM, LIMITED ROM TO LOWER EXTREMITIES.    RESP: ROOM AIR. EUPHENIC. SP02>95%    CV: SR: HR 80- 90'S. SBP 100- 120'S.     GU: ANURIC  GI: FULL LIQUID. NIL B/A. ATTEMPTED SLP , PATIENT C/O NAUSEA. SAME ABORTED .    SKIN: SKIN TEAR TO BILATERAL FOLDS. WOUND VAC TO ABDOMEN. 50 CC DRAINAGE NOTED   Problem: Moderate/High Fall Risk Score >5  Goal: Patient will remain free of falls  Outcome: Progressing  Flowsheets (Taken 08/15/2022 1957)  Moderate Risk (6-13):   MOD-Remain with patient during toileting   MOD-Floor mat at bedside (where available) if appropriate   MOD-Re-orient confused patients   MOD-Consider a move closer to Nurses Station   MOD-Place bedside commode and assistive devices out of sight when not in use   MOD-Utilize diversion activities   MOD-Request PT/OT consult order for patients with gait/mobility impairment   MOD-Apply bed exit alarm if patient is confused  High (Greater than 13):   MOD-Use of chair-pad alarm when appropriate   MOD-Remain with patient during toileting   MOD-Re-orient confused patients   MOD-Perform dangle, stand, walk (DSW) prior to mobilization   MOD-Include family in multidisciplinary POC discussions   MOD-Place Fall Risk level on whiteboard in room   HIGH-Utilize chair pad alarm for patient while in the chair   HIGH-Bed alarm on at all times while patient in bed   HIGH-Visual cue at entrance to patient's room  VH Moderate Risk (6-13):   INITIATE YELLOW "FALL RISK" SIGNAGE   YELLOW "FALL RISK" ARM BAND   USE OF BED EXIT ALARM IF PATIENT IS CONFUSED OR IMPULSIVE. PLACE RESET BED ALARM SIGN ABOVE BED   Remain with patient during toileting   Include family/significant other in multidisciplinary discussion regarding plan of care as appropriate   PLACE FALL RISK LEVEL ON WHITE BOARD FOR COMMUNICATION PURPOSES IN PATIENT'S ROOM   YELLOW NON-SKID SLIPPERS   Use chair-pad alarm device   Use of floor mat   Use of "STOP ask for help"  sign  VH High Risk (Greater than 13):   BED ALARM WILL BE ACTIVATED WHEN THE PATEINT IS IN BED WITH SIGNAGE "RESET BED ALARM"   RED "HIGH FALL RISK" SIGNAGE   ALL REQUIRED MODERATE INTERVENTIONS   PATIENT IS TO BE SUPERVISED FOR ALL TOILETING ACTIVITIES   Keep door open for better visibility   Use of roll guard   Use chair-pad alarm device   Use assistive devices   Request PT/OT therapy consult order from physician for patients with gait/mobility impairment     Problem: Compromised Sensory Perception  Goal: Sensory Perception Interventions  Outcome: Progressing  Flowsheets (Taken 08/15/2022 1957)  Sensory Perception Interventions: Offload heels, Pad bony prominences, Reposition q 2hrs/turn Clock, Q2 hour skin assessment under devices if present     Problem: Compromised Moisture  Goal: Moisture level Interventions  Outcome: Progressing  Flowsheets (Taken 08/15/2022 1957)  Moisture level Interventions: Moisture wicking products, Moisture barrier cream     Problem: Compromised Activity/Mobility  Goal: Activity/Mobility Interventions  Outcome: Progressing  Flowsheets (Taken 08/15/2022 1957)  Activity/Mobility Interventions: Pad bony prominences, TAP Seated positioning system when OOB, Promote PMP, Reposition q 2 hrs / turn clock, Offload heels     Problem: Compromised Nutrition  Goal: Nutrition Interventions  Outcome: Progressing  Flowsheets (Taken 08/15/2022 1957)  Nutrition Interventions: Discuss nutrition at Rounds, I&Os, Document % meal eaten, Daily  weights     Problem: Compromised Friction/Shear  Goal: Friction and Shear Interventions  Outcome: Progressing  Flowsheets (Taken 08/15/2022 1957)  Friction and Shear Interventions: Pad bony prominences, Off load heels, HOB 30 degrees or less unless contraindicated, Consider: TAP seated positioning, Heel foams     Problem: Inadequate Airway Clearance  Goal: Patent Airway maintained  Outcome: Progressing  Flowsheets (Taken 08/15/2022 1957)  Patent airway maintained:   Reinforce  use of ordered respiratory interventions (i.e. CPAP, BiPAP, Incentive Spirometer, Acapella, etc.)   Suction secretions as needed   Reposition patient every 2 hours and as needed unless able to self-reposition  Goal: Normal respiratory rate/effort achieved/maintained  Outcome: Progressing  Flowsheets (Taken 08/15/2022 1957)  Normal respiratory rate/effort achieved/maintained: Plan activities to conserve energy: plan rest periods     Problem: Infection Prevention  Goal: Free from infection  Outcome: Progressing  Flowsheets (Taken 08/15/2022 1957)  Free from infection:   Assess incision for evidence of healing   Monitor/assess output from surgical drain if present   Monitor/assess vital signs     Problem: Impaired Mobility  Goal: Mobility/Activity is maintained at optimal level for patient  Outcome: Progressing  Flowsheets (Taken 08/15/2022 1957)  Mobility/activity is maintained at optimal level for patient:   Encourage independent activity per ability   Consult/collaborate with Physical Therapy and/or Occupational Therapy     Problem: Constipation  Goal: Fluid and electrolyte balance are achieved/maintained  Outcome: Progressing  Flowsheets (Taken 08/15/2022 1957)  Fluid and electrolyte balance are achieved/maintained:   Monitor/assess lab values and report abnormal values   Observe for cardiac arrhythmias   Assess and reassess fluid and electrolyte status   Monitor for muscle weakness     Problem: Safety  Goal: Patient will be free from injury during hospitalization  Outcome: Progressing  Flowsheets (Taken 08/15/2022 1957)  Patient will be free from injury during hospitalization:   Include patient/ family/ care giver in decisions related to safety   Use appropriate transfer methods   Ensure appropriate safety devices are available at the bedside   Assess for patients risk for elopement and implement Elopement Risk Plan per policy   Hourly rounding  Goal: Patient will be free from infection during hospitalization  Outcome:  Progressing  Flowsheets (Taken 08/15/2022 1957)  Free from Infection during hospitalization:   Monitor all insertion sites (i.e. indwelling lines, tubes, urinary catheters, and drains)   Assess and monitor for signs and symptoms of infection   Monitor lab/diagnostic results     Problem: Pain  Goal: Pain at adequate level as identified by patient  Outcome: Progressing  Flowsheets (Taken 08/15/2022 1957)  Pain at adequate level as identified by patient:   Assess pain on admission, during daily assessment and/or before any "as needed" intervention(s)   Reassess pain within 30-60 minutes of any procedure/intervention, per Pain Assessment, Intervention, Reassessment (AIR) Cycle   Consult/collaborate with Physical Therapy, Occupational Therapy, and/or Speech Therapy   Evaluate patient's satisfaction with pain management progress   Consult/collaborate with Pain Service   Offer non-pharmacological pain management interventions   Evaluate if patient comfort function goal is met     Problem: Side Effects from Pain Analgesia  Goal: Patient will experience minimal side effects of analgesic therapy  Outcome: Progressing  Flowsheets (Taken 08/15/2022 1957)  Patient will experience minimal side effects of analgesic therapy:   Monitor/assess patient's respiratory status (RR depth, effort, breath sounds)   Assess for changes in cognitive function   Evaluate for opioid-induced sedation with appropriate assessment  tool (i.e. POSS)     Problem: Hemodynamic Status: Cardiac  Goal: Stable vital signs and fluid balance  Outcome: Progressing  Flowsheets (Taken 08/15/2022 1957)  Stable vital signs and fluid balance:   Assess signs and symptoms associated with cardiac rhythm changes   Monitor lab values     Problem: Fluid and Electrolyte Imbalance/ Endocrine  Goal: Fluid and electrolyte balance are achieved/maintained  Outcome: Progressing  Flowsheets (Taken 08/15/2022 1957)  Fluid and electrolyte balance are achieved/maintained:   Monitor/assess  lab values and report abnormal values   Observe for cardiac arrhythmias   Assess and reassess fluid and electrolyte status   Monitor for muscle weakness     Problem: Inadequate Gas Exchange  Goal: Patent Airway maintained  Outcome: Progressing  Flowsheets (Taken 08/15/2022 1957)  Patent airway maintained:   Reinforce use of ordered respiratory interventions (i.e. CPAP, BiPAP, Incentive Spirometer, Acapella, etc.)   Suction secretions as needed   Reposition patient every 2 hours and as needed unless able to self-reposition     Problem: Every Day - Stroke  Goal: Mobility/Activity is maintained at optimal level for patient  Outcome: Progressing  Flowsheets (Taken 08/15/2022 1957)  Mobility/activity is maintained at optimal level for patient:   Encourage independent activity per ability   Consult/collaborate with Physical Therapy and/or Occupational Therapy     Problem: Pain interferes with ability to perform ADL  Goal: Pain at adequate level as identified by patient  Outcome: Progressing  Flowsheets (Taken 08/15/2022 1957)  Pain at adequate level as identified by patient:   Assess pain on admission, during daily assessment and/or before any "as needed" intervention(s)   Reassess pain within 30-60 minutes of any procedure/intervention, per Pain Assessment, Intervention, Reassessment (AIR) Cycle   Consult/collaborate with Physical Therapy, Occupational Therapy, and/or Speech Therapy   Evaluate patient's satisfaction with pain management progress   Consult/collaborate with Pain Service   Offer non-pharmacological pain management interventions   Evaluate if patient comfort function goal is met

## 2022-08-15 NOTE — Treatment Plan (Cosign Needed)
4 eyes in 4 hours pressure injury assessment note:      Completed with:   Unit & Time admitted:              Bony Prominences: Check appropriate box; if wound is present enter wound assessment in LDA     Occiput:                 [x]WNL  [] Wound present  Face:                     [x]WNL  [] Wound present  Ears:                      [x]WNL  [] Wound present  Spine:                    [x]WNL  [] Wound present  Shoulders:             [x]WNL  [] Wound present  Elbows:                  [x]WNL  [] Wound present  Sacrum/coccyx:     []WNL  [x] Wound present  Ischial Tuberosity:  [x]WNL  [] Wound present  Trochanter/Hip:      [x]WNL  [] Wound present  Knees:                   [x]WNL  [] Wound present  Ankles:                   [x]WNL  [] Wound present  Heels:                    [x]WNL  [] Wound present  Other pressure areas:  [] Wound location       Device related: [] Device name:         LDA completed if wound present: yes/no  Consult WOCN if necessary    Other skin related issues, ie tears, rash, etc, document in Integumentary flowsheet

## 2022-08-15 NOTE — Plan of Care (Signed)
..  NEURO:  A/O X2/3  Following commands, grips weak,   Q4hr Neuro checks;   PERRLA:  CV:  Afebrile. Skin Warm and dry ,  pulses palpable;   HR: 60-70s; SR on monitor.   SBP: 88 (map 65)-110s  ; normotensive  Denies chest pain. No edema   SCDs intact  Respiratory:   Room air, O2 Sat >95%.   GI:  Diets: full liquid   Blood sugars Q 6  Abdomen soft , bowel sounds present, LBM 7/27  GU:  Anuric, dialysis pt   SKIN:   Mepilex/Adhesive Foam Dressing applied to sacrum/BL heels  Repositioned at intervals. Kept clean, dry and comfortable.      Safety maintained. Bed placed in the lowest position with call bell in reach, bed alarm on, and hourly rounding completed.  No further needs at this time.          Problem: Moderate/High Fall Risk Score >5  Goal: Patient will remain free of falls  Outcome: Progressing  Flowsheets (Taken 08/14/2022 2200)  High (Greater than 13):   HIGH-Visual cue at entrance to patient's room   HIGH-Bed alarm on at all times while patient in bed   HIGH-Utilize chair pad alarm for patient while in the chair   HIGH-Apply yellow "Fall Risk" arm band   HIGH-Pharmacy to initiate evaluation and intervention per protocol   HIGH-Initiate use of floor mats as appropriate   HIGH-Consider use of low bed     Problem: Impaired Mobility  Goal: Mobility/Activity is maintained at optimal level for patient  Outcome: Progressing  Flowsheets (Taken 08/15/2022 0031)  Mobility/activity is maintained at optimal level for patient:   Encourage independent activity per ability   Consult/collaborate with Physical Therapy and/or Occupational Therapy     Problem: Renal Instability  Goal: Fluid and electrolyte balance are achieved/maintained  Outcome: Progressing  Flowsheets (Taken 08/15/2022 0031)  Fluid and electrolyte balance are achieved/maintained:   Monitor/assess lab values and report abnormal values   Assess and reassess fluid and electrolyte status   Observe for cardiac arrhythmias   Monitor for muscle weakness   Follow fluid  restrictions/IV/PO parameters     Problem: Every Day - Stroke  Goal: Mobility/Activity is maintained at optimal level for patient  Outcome: Progressing  Flowsheets (Taken 08/15/2022 0031)  Mobility/activity is maintained at optimal level for patient:   Encourage independent activity per ability   Consult/collaborate with Physical Therapy and/or Occupational Therapy

## 2022-08-15 NOTE — Progress Notes (Signed)
[facility]  PROGRESS NOTE      Patient: Shelby Barron  Date: 08/15/2022   LOS: 48 Days  Admission Date: 06/28/2022   MRN: [id number]  Attending: Donzetta Sprung, MD  When on service as the attending, please contact me on Epic Secure Chat from 7 AM - 7 PM for non-urgent issues. For urgent matters use XTend page from 7 AM - 7 PM.       ASSESSMENT/PLAN     Shelby Barron is a 82 y.o. female admitted with Bladder cancer    Interval Summary:     Assessment and plan:     Hypotension  Encephalopathy  -Considering to be more vasovagal  -CT without contrast denies nasal congestion or sore throat for any acute findings apart from the abdominal wound from recent procedure  -Blood pressure stable, possibly need to change midodrine to standing order 3 times daily  -Mentation is improved, alert and oriented x 3, stable  -Continue neurochecks every 4 hours     Acute toxic metabolic Encephalopathy/delirium stable-improved  Possible underlying cognitive impairment  - On and off confusion alert oriented x 2, now relatively stable the last several days  - Patient was upgraded to ICU on 6/30 for lethargy and decreased responsiveness. CT head was with no acute finding.   - EEG consistent with encephalopathy no seizure, CVA management as below  - SLP evaluated patient today, pulled back diet to full liquid with mildly thickened liquid.  NG tube removed 7/9.  - Palliative signed off, family was clear they would like no limits to care and patient remained full code.     Anemia of CKD and acute blood loss anemia  -Hemoglobin stable today, 7.3 from 7.2; no bleeding diathesis noted  -Will monitor in the morning, threshold for transfusion is below 7.0  - given Aranesp this admission per nephrology  - No signs of active bleeding.     S/p open cystectomy, bilateral nephrectomies, and resection of small bowel after intra-op enterotomy    Mixed shock - hemorrhagic vs septic: Resolved  Necrotic wound tissue  - 6/10 small bowel enteretomy  with 1L EBL. Went to the OR on June 12 for small bowel anastomosis, closure of mesenteric defect, closure of abdominal wall with wound VAC application. Wound VAC in place. Will need outpatient wound care follow up for ongoing management.  - Surgery consulted, CT abdomen with and without contrast did not show any anastomotic leak or abscess.   - Status post wound debridement by general surgery at bedside on 7/15 for necrotic wound tissue  - ID consulted, completed meropenem and Mycamine for 2 weeks on 7/3. No further abx recommended after bedside debridement on 7/15  -Family would like reevaluation by wound care on Monday around change of wound VAC bandaging before discharge  Wound care team recommendation.    When patient discharged or VAC discontinued -   Remove all foam dressing and non adherent dressing and place hydrogel moistened gauze into the wound bed, cover with ABD pad tape to secure.   Wound Vac machine - discard canister and tubing.    Place wound vac in CLEAR plastic bag and place in the soiled utility room. DO NOT USE RED BIOHAZARD BAGS.    Please contact [facility] office [phone] and leave a message for [facility] to pick up.        Acute CVA   Petechial hemorrhage vs cortical laminar necrosis  - Stopped ASA and started Eliquis per neurology on 7/5  after >10 days after CVA. Continue statin therapy.  - Carotid Doppler showed plaque with no critical stenosis  - Head CT on 7/7 w/ a few areas of cortical hyperdensity - petechial hemorrhage vs cortical laminar necrosis. Discussed with neuro OK to continue eliquis for now. Repeat head CT on 7/8 stable.  - EEG encephalopathic pattern no seizure, repeat MRI no new infarction  - PT OT recommend SNF on discharge     Acute hypoxemic respiratory failure resolved  COPD  - Extubated on 6/13, currently on room air  - X-ray repeated on 6/29 showed resolution of interstitial edema     ESRD  S/p CRRT this admission, now on HD TTS  Continue dialysis on routine basis as outpatient  IR  removed the nonfunctional permacath and right Quinton IJ cath and replaced with a new permacath on 6/26.     New onset Afib with RVR, improved  - CHA2DS2-VASc score is 7.   - Status post amiodarone drip. Now stable on oral amiodarone and coreg per cardiology recommendations  - GI and general surgery cleared for anticoagulation. Started on Eliquis since 07/23/22 with neurology clearance after CVA     Renal cell carcinoma outpatient follow-up hematology oncology  Urothelial carcinoma                      Nutrition: Full liquid diet with mildly thick liquids: Seen by SLP again today  Malnutrition Documentation     Severe malnutrition related to inadequate oral intake in the setting of acute illness as evidenced by PO intake meeting <50% of needs for >5 days, moderate-severe muscle loss (temporalis, deltoid, interosseous, quadriceps, gastrocnemius, trapezius, supraspinatus, infraspinatus), moderate fat loss (orbital, buccal, triceps), and 13% weight loss in >1 month            Patient has BMI=Body mass index is 32.9 kg/m.  Diagnosis: Obesity based on BMI criteria                  Recent Labs    Lab 08/14/22  0531 08/13/22  0322 08/12/22  0521    Hemoglobin 8.7* 7.3* 7.2*    Hematocrit 27.3* 23.1* 23.2*    MCV 92.5 95.1 93.5    WBC 14.58* 13.52* 11.02*    Platelet Count 363* 363* 288                Recent Labs    Lab 07/18/22  0421 06/02/22  0514    Ferritin 2,300.21*  --     Iron 18* 62    TIBC 99* 145*    Iron Saturation 18 43             Code Status: Full Code     Dispo: Monday after wound VAC bandaging replacement and reassessment; daughter would like to pay for private transportation so that the patient get to the skilled nursing facility at a reasonable hour.     Family Contact: Daughter, Kodie Pettrey, updated over the phone with patient's husband present in the background; phone was on speaker    DVT Prophylaxis:   Current Facility-Administered Medications (Includes Only Anticoagulants, Misc. Hematological)    Medication Dose Route Last Admin    apixaban (ELIQUIS) tablet 2.5 mg  2.5 mg Oral 2.5 mg at 08/15/22 1062          CHART  REVIEW & DISCUSSION     The following chart items were reviewed as of 2:45 PM on 08/15/22:  [x]  Lab Results [x]   Imaging Results   [x]  Problem List  [x]  Current Orders [x]  Current Medications  []  Allergies  []  Code Status [x]  Previous Notes   []  SDoH    The management and plan of care for this patient was discussed with the following specialty consultants:  []  Cardiology  []  Gastroenterology                 []  Infectious Disease  []  Pulmonology []  Neurology                []  Nephrology  []  Neurosurgery []  Orthopedic Surgery  []  Heme/Onc  []  General Surgery []  Psychiatry                                   []  Palliative    SUBJECTIVE     Shelby Barron is an 82 year old female seen examined at bedside.  Patient's nurse in the room during this encounter.  Patient states that her abdomen hurts, just given tramadol.  Patient educated about the time it takes for oral medication to kick in and will be given a very small dose of morphine later on if it does not help.  Patient agreeable with this plan.  Spoke with daughter as well and updated with plan.    MEDICATIONS     Current Facility-Administered Medications   Medication Dose Route Frequency    acetaminophen  650 mg Oral 4 times per day    Or    acetaminophen  650 mg Oral 4 times per day    amiodarone  200 mg per NG tube Daily    apixaban  2.5 mg Oral Q12H [facility]    atorvastatin  40 mg Oral QHS    b complex-vitamin c-folic acid  1 tablet Oral Daily    darbepoetin alfa  0.45 mcg/kg Subcutaneous Weekly    lidocaine  20 mL Intradermal Once    megestrol  400 mg Oral Daily    midodrine  10 mg Oral Q Tue/Thu/Sat (AM)    pantoprazole  40 mg per NG tube Q12H [facility]    polyethylene glycol  17 g Oral Daily    senna-docusate  1 tablet Oral QHS    thiamine  100 mg per NG tube Daily    umeclidinium-vilanterol  1 puff Inhalation QAM       PHYSICAL EXAM     Vitals:     08/15/22 1200   BP:    Pulse:    Resp:    Temp: 97.6 F (36.4 C)   SpO2:        Temperature: Temp  Min: 97.5 F (36.4 C)  Max: 99.4 F (37.4 C)  Pulse: Pulse  Min: 62  Max: 88  Respiratory: Resp  Min: 22  Max: 46  Non-Invasive BP: BP  Min: 88/46  Max: 125/56  Pulse Oximetry SpO2  Min: 94 %  Max: 100 %    Intake and Output Summary (Last 24 hours) at Date Time    Intake/Output Summary (Last 24 hours) at 08/15/2022 1445  Last data filed at 08/14/2022 2000  Gross per 24 hour   Intake 356 ml   Output --   Net 356 ml         GEN APPEARANCE: Uncomfortable, A&OX2 on room air  HEENT: Anicteric  NECK: Supple  CVS: RRR, S1, S2  LUNGS: Decreased breath sounds at bases  ABD: Soft;  + Normoactive BS; bandaging  and wound VAC sponge and dressing noted  EXT: No edema;   NEURO: No new focal deficits      LABS     Recent Labs   Lab 08/15/22  0309 08/14/22  0531 08/13/22  0322   WBC 14.98* 14.58* 13.52*   RBC 2.74* 2.95* 2.43*   Hemoglobin 8.1* 8.7* 7.3*   Hematocrit 25.5* 27.3* 23.1*   MCV 93.1 92.5 95.1   Platelet Count 363* 363* 363*       Recent Labs   Lab 08/15/22  0309 08/14/22  0531 08/13/22  0322 08/12/22  0521 08/11/22  0212   Sodium 142 144 142 143 143   Potassium 3.9 3.8 3.7 3.8 4.2   Chloride 103 102 103 105 105   CO2 26 25 28 23 23    BUN 16 29* 15 32* 23*   Creatinine 2.7* 4.0* 2.9* 4.7* 3.7*   Glucose 103* 113* 96 91 110*   Calcium 8.8 8.9 8.2 8.7 8.3   Magnesium 1.8 1.8 1.7 1.8 1.9       Recent Labs   Lab 08/15/22  0309 08/14/22  0531 08/13/22  0322 08/11/22  0212 08/10/22  2236 08/10/22  0430   ALT  --   --   --   --  8 10   AST (SGOT)  --   --   --   --  11 11   Bilirubin, Total  --   --   --   --  0.2 0.3   Albumin 1.8* 1.4* 1.4*  More results in Results Review 1.5* 1.5*   Alkaline Phosphatase  --   --   --   --  85 91   More results in Results Review = values in this interval not displayed.       Recent Labs   Lab 08/10/22  2236   hs Troponin 7.1             Microbiology Results (last 15 days)       Procedure  Component Value Units Date/Time    Culture, Blood, Aerobic And Anaerobic [[id number]] Collected: 08/11/22 0208    Order Status: Completed Specimen: Blood, Venous Updated: 08/15/22 4098     Culture Blood No growth at 4 days    Culture, Blood, Aerobic And Anaerobic [[id number]] Collected: 08/11/22 0208    Order Status: Completed Specimen: Blood, Venous Updated: 08/15/22 0701     Culture Blood No growth at 4 days             RADIOLOGY     CT Abdomen Pelvis WO IV/ WO PO Cont    Result Date: 08/11/2022  1. Colonic diverticulosis. No findings to suggest acute diverticulitis or colitis. 2. Small ascites and retroperitoneal free fluid, increased. 3. Nonspecific gallbladder distention. 4. Hiatal hernia. 5. Open midline wound. 6. Left flank hernia containing ascites. Kennyth Lose, MD 08/11/2022 4:23 PM    MRI Brain WO Contrast    Result Date: 08/11/2022  Motion degraded exam. No acute intracranial abnormality. No new interval infarct. Leota Jacobsen, MD 08/11/2022 12:51 PM    XR Chest AP Portable    Result Date: 08/11/2022  Mild left basilar airspace opacities and small bilateral pleural effusions. Judd Gaudier, MD 08/11/2022 2:48 AM    CT Head WO Contrast    Result Date: 08/11/2022   Redemonstrated multiple areas of subacute infarction with cortical hyperdensities which favor petechial hemorrhage in the right frontal lobe and bilateral parietal lobes. These are similar to  the prior examination on 07/26/2022. No evidence of new or progressive acute infarct. No mass effect. Vernie Murders, MD 08/11/2022 12:00 AM    XR Chest AP Portable    Result Date: 08/02/2022   Hypoventilation with basilar atelectasis. Mild central vascular congestion. Charlott Rakes, MD 08/02/2022 3:23 PM    CT Head WO Contrast    Result Date: 07/26/2022   1.No significant change in multifocal areas of subacute infarction within bilateral cerebral hemispheres. 2.Small areas of petechial hemorrhage in the left parietal and occipital lobes are unchanged. No new  hemorrhage or measurable parenchymal hematoma. 3.Persistent complete opacification of the right mastoid air cells and middle ear cavity. Garnette Gunner, MD 07/26/2022 12:27 PM    CT Head WO Contrast    Result Date: 07/25/2022   1.Redemonstration of multifocal areas of bilateral acute infarctions. A few areas of cortical hyperdensity likely represent petechial hemorrhage. No mass occupying hematoma. No significant mass effect or midline shift. 2.Redemonstrated right mastoid and right middle ear cavity effusion with areas of bony thinning versus dehiscence of the right tegmen tympani and right tegmen mastoideum. Vara Guardian, MD 07/25/2022 2:19 PM    CT Abdomen Pelvis W IV And PO Cont    Result Date: 07/19/2022   1.Slight interval increase in left-sided retroperitoneal fluid collection with the other abdominal fluid collections appearing relatively stable. 2. There is no definite abscess or acute inflammatory process. 3. Incidental note is made of a small paraesophageal type hernia. 4. Sigmoid diverticulosis is noted without CT evidence of diverticulitis. 5. Other findings as noted above. Miguel Dibble, MD 07/19/2022 6:36 AM    MRI Brain WO Contrast    Result Date: 07/18/2022   Interval evolution of multifocal recent infarcts in the bilateral cerebral hemispheres. No new infarct. Milan Waunita Schooner, MD 07/18/2022 5:57 PM    XR Chest AP Portable    Result Date: 07/17/2022  1.Near-complete resolution of interstitial edema. 2.Improving pulmonary vascular congestion. Legrand Pitts, MD 07/17/2022 2:38 PM    CT Head WO Contrast    Result Date: 07/17/2022   1.No new acute intracranial pathology. 2.There are multiple regions of cytotoxic edema in the right > left frontal and left > right occipital lobes, consistent with known early subacute infarcts. Dannielle Burn 07/17/2022 8:26 AM   Echo Results       None          Results for orders placed or performed during the hospital encounter of 06/28/22   MRI Brain WO Contrast    Narrative    HISTORY:  Stroke, follow up. watershed infarcts this admission, now with  recurrent AMS, rule out recurrent stroke.      COMPARISON: Brain MRI 07/18/2022    TECHNIQUE: MRI of the brain performed on a 1.5 Tesla scanner without  intravenous contrast. per acute stroke protocol. Axial diffusion,  susceptibility, and FLAIR weighted sequences were obtained.     CONTRAST: None.    FINDINGS:   Intracranial: This exam is somewhat motion degraded which limits  sensitivity, but with diagnostic images still obtained. The below findings  are within this context.    There is no acute hemorrhage or acute infarct. Evolving change from prior  multifocal infarcts in bilateral cerebral hemispheres in the left MCA, ACA,  PCA watershed territories, with associated susceptibility artifact likely  representing old petechial hemorrhage. No new infarcts. T2 shine through on  DWI. Symmetric ventricular and sulci enlargement consistent with volume  loss. There are nonspecific bilateral hemispheric periventricular, deep,  and subcortical white matter T2 FLAIR hyperintensities consistent with  chronic microangiopathic change. No masses or fluid collections.    Partially empty sella with expanded sella turcica. The cerebellar tonsils  are above the foramen magnum.     Vessels: Usual flow void appearance in the major arteries and veins  Bone: There are no suspicious calvarial or skull base lesions.  Extracranial: Status post bilateral lens surgery. The visualized paranasal  sinuses are predominantly clear.. Complete opacification of the right  mastoid air cells.      Impression    Motion degraded exam. No acute intracranial abnormality. No new interval  infarct.    Leota Jacobsen, MD  08/11/2022 12:51 PM   CT Head WO Contrast    Narrative    HISTORY: Altered mental status.    COMPARISON: 07/26/2022.    TECHNIQUE: CT of the head performed without intravenous contrast.  Multiplanar reformatted images were created and reviewed. The following  dose  reduction techniques were utilized: automated exposure control and/or  adjustment of the mA and/or KV according to patient size, and the use of an  iterative reconstruction technique.    CONTRAST: None.    FINDINGS:  Redemonstrated multiple areas of subacute infarction with cortical  hyperdensities which favor petechial hemorrhage in the right frontal lobe  and bilateral parietal lobes. These are similar to the prior examination on  07/26/2022.    No evidence of new or progressive acute infarct. No mass effect. No  evidence of new or progressive acute intracranial hemorrhage. No  extra-axial collection is seen.    Stable volume loss and hypodensities in the white matter consistent with  chronic ischemic changes.      Impression       Redemonstrated multiple areas of subacute infarction with cortical  hyperdensities which favor petechial hemorrhage in the right frontal lobe  and bilateral parietal lobes. These are similar to the prior examination on  07/26/2022. No evidence of new or progressive acute infarct. No mass  effect.    Vernie Murders, MD  08/11/2022 12:00 AM       Signed,  Donzetta Sprung, MD  2:45 PM 08/15/2022

## 2022-08-15 NOTE — Progress Notes (Cosign Needed)
4 eyes in 4 hours pressure injury assessment note:      Completed with:   Unit & Time admitted:              Bony Prominences: Check appropriate box; if wound is present enter wound assessment in LDA     Occiput:                 [x] WNL  []  Wound present  Face:                     [x] WNL  []  Wound present  Ears:                      [x] WNL  []  Wound present  Spine:                    [x] WNL  []  Wound present  Shoulders:             [x] WNL  []  Wound present  Elbows:                  [x] WNL  []  Wound present  Sacrum/coccyx:     [] WNL  [x]  Wound present - skin tear, bilat buttocks   Ischial Tuberosity:  [x] WNL  []  Wound present  Trochanter/Hip:      [x] WNL  []  Wound present  Knees:                   [x] WNL  []  Wound present  Ankles:                   [x] WNL  []  Wound present  Heels:                    [x] WNL  []  Wound present  Other pressure areas:  [x]  Wound location - abdominal wound vac       Device related: []  Device name:         LDA completed if wound present: yes/no  Consult WOCN if necessary    Other skin related issues, ie tears, rash, etc, document in Integumentary flowsheet

## 2022-08-15 NOTE — SLP Progress Note (Addendum)
[facility]  Speech Therapy Treatment Note    Patient:  Shelby Barron MRN#:  [id number]  Unit:  [place] [facility] Room/Bed:  V4098/J1914-78      Time of treatment:   SLP Received On: 08/15/22  Start Time: 0941  Stop Time: 1006  Time Calculation (min): 25 min       Personal Protective Equipment (PPE)  Gloves and Procedure Mask with Shield    Subjective: RN cleared pt for treatment. Upon starting session, RN and tech repositioning patient and attending to other pt needs. Pt requesting something to drink, stating she is thirsty.        Objective: Oropharyngeal analysis during PO trials in order to determine LRD,development and training of swallow strategies in order to improve swallow function and decrease risk of penetration/aspiration;  therapeutic feedings by SLP  and safe swallowing precautions education.       Assessment: Pt presents with mild-mod oropharyngeal dysphagia c/b mild delay in bolus transit and pharyngeal swallow trigger, fairly adequate HLE upon subjective digital palpation, some improvement since last session particularly with tolerance of thin liquid (ice water) single straw sips completed x10 without overt s/s penetration, aspiration or retrograde backflow. Pt also tolerated x5 ice chips prior to thin liquid trials, and x4 bites pureed solids, without s/s penetration or aspiration and adequate oral clearance, before refusing any more bites of pureed solids, stating she didn't like them (apple sauce and vanilla yogurt). Pt refused to try meal of oatmeal, refused other solids offered. Pt reported nausea after RN then administered medications crushed in x6 fl oz mildly thickened liquid, and trials had to be discontinued as pt refused all further trials, stated she felt like she was going to vomit. SLP remained present with pt until feeling improved. RN present at handoff.        Plan/Recommendations:  Continue full liquid   and Mildly thick  MT2  Slow  feeding rate, small bites / sips , upright position during and after the meals , Reflux precautions , full assistance with meals , liquid wash, and check oral cavity for residue         Charges Minutes   SLP treat    Swallow treat 25       08/15/2022  Antony Madura, MA, CCC-SLP  Speech-Language Pathologist  [employer] [phone]  Department # [phone]

## 2022-08-16 LAB — LAB USE ONLY - CBC WITH DIFFERENTIAL
Absolute Basophils: 0.04 10*3/uL (ref 0.00–0.08)
Absolute Eosinophils: 0.01 10*3/uL (ref 0.00–0.44)
Absolute Immature Granulocytes: 0.43 10*3/uL — ABNORMAL HIGH (ref 0.00–0.07)
Absolute Lymphocytes: 3.39 10*3/uL — ABNORMAL HIGH (ref 0.42–3.22)
Absolute Monocytes: 1.56 10*3/uL — ABNORMAL HIGH (ref 0.21–0.85)
Absolute Neutrophils: 9.54 10*3/uL — ABNORMAL HIGH (ref 1.10–6.33)
Absolute nRBC: 0 10*3/uL (ref <=0.00)
Basophils %: 0.3 %
Eosinophils %: 0.1 %
Hematocrit: 23 % — ABNORMAL LOW (ref 34.7–43.7)
Hemoglobin: 7.3 g/dL — ABNORMAL LOW (ref 11.4–14.8)
Immature Granulocytes %: 2.9 %
Lymphocytes %: 22.6 %
MCH: 29.9 pg (ref 25.1–33.5)
MCHC: 31.7 g/dL (ref 31.5–35.8)
MCV: 94.3 fL (ref 78.0–96.0)
MPV: 9.9 fL (ref 8.9–12.5)
Monocytes %: 10.4 %
Neutrophils %: 63.7 %
Platelet Count: 367 10*3/uL — ABNORMAL HIGH (ref 142–346)
Preliminary Absolute Neutrophil Count: 9.54 10*3/uL — ABNORMAL HIGH (ref 1.10–6.33)
RBC: 2.44 10*6/uL — ABNORMAL LOW (ref 3.90–5.10)
RDW: 15 % (ref 11–15)
WBC: 14.97 10*3/uL — ABNORMAL HIGH (ref 3.10–9.50)
nRBC %: 0 /100 WBC (ref <=0.0)

## 2022-08-16 LAB — RENAL FUNCTION PANEL
Albumin: 1.5 g/dL — ABNORMAL LOW (ref 3.5–5.0)
Anion Gap: 15 (ref 5.0–15.0)
BUN: 26 mg/dL — ABNORMAL HIGH (ref 7–21)
CO2: 24 mEq/L (ref 17–29)
Calcium: 8.9 mg/dL (ref 7.9–10.2)
Chloride: 101 mEq/L (ref 99–111)
Creatinine: 3.6 mg/dL — ABNORMAL HIGH (ref 0.4–1.0)
GFR: 11.9 mL/min/{1.73_m2} — ABNORMAL LOW (ref >=60.0)
Glucose: 119 mg/dL — ABNORMAL HIGH (ref 70–100)
Phosphorus: 2.6 mg/dL (ref 2.3–4.7)
Potassium: 4.7 mEq/L (ref 3.5–5.3)
Sodium: 140 mEq/L (ref 135–145)

## 2022-08-16 LAB — MAGNESIUM: Magnesium: 1.9 mg/dL (ref 1.6–2.6)

## 2022-08-16 LAB — WHOLE BLOOD GLUCOSE POCT
Whole Blood Glucose POCT: 115 mg/dL — ABNORMAL HIGH (ref 70–100)
Whole Blood Glucose POCT: 113 mg/dL — ABNORMAL HIGH (ref 70–100)
Whole Blood Glucose POCT: 123 mg/dL — ABNORMAL HIGH (ref 70–100)
Whole Blood Glucose POCT: 161 mg/dL — ABNORMAL HIGH (ref 70–100)

## 2022-08-16 LAB — CALCIUM, IONIZED: Calcium, Ionized: 2.4 mEq/L (ref 2.23–2.56)

## 2022-08-16 MED ORDER — MIDODRINE HCL 5 MG PO TABS
5.0000 mg | ORAL_TABLET | Freq: Two times a day (BID) | ORAL | Status: DC
Start: 2022-08-16 — End: 2022-08-18
  Administered 2022-08-16: 5 mg via ORAL
  Filled 2022-08-16 (×4): qty 1

## 2022-08-16 MED ORDER — MIDODRINE HCL 5 MG PO TABS
5.0000 mg | ORAL_TABLET | Freq: Two times a day (BID) | ORAL | 0 refills | Status: DC
Start: 2022-08-16 — End: 2022-09-05

## 2022-08-16 MED ORDER — MEGESTROL ACETATE 400 MG/10ML PO SUSP
400.0000 mg | Freq: Every day | ORAL | 0 refills | Status: DC
Start: 2022-08-17 — End: 2022-09-05

## 2022-08-16 NOTE — UM Notes (Signed)
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 Patient past medical COPD, [name], type 2 diabetes, renal cell carcinoma status post partial bilateral nephrectomy, [name], new diagnosis of urothelial cancer.  She was recently admitted in May 2024 at which time she was transitioned from PD to HD.  At that time was diagnosed with urothelial cancer.  Patient presented for scheduled cystectomy and completion nephrectomy with urology. [name], [name], extubated and off pressors since 6/13. Patient remains for continued monitoring.        Vital Signs  Temp:  [97.4 F (36.3 C)-99.8 F (37.7 C)]   Heart Rate:  [75-91]   Resp Rate:  [21-31]   BP: (99-123)/(53-60)   SpO2:  [95 %-96 %]   Weight:  [85.4 kg (188 lb 4.4 oz)]   BMI (calculated):  [34.4]      Abnormal Labs   Latest Reference Range & Units 08/16/22 03:51   WBC 3.10 - 9.50 x10 3/uL 14.97 (H)   Hemoglobin 11.4 - 14.8 g/dL 7.3 (L)   Hematocrit 84.6 - 43.7 % 23.0 (L)   Platelet Count 142 - 346 x10 3/uL 367 (H)   RBC 3.90 - 5.10 x10 6/uL 2.44 (L)   Instrument Absolute Neutrophil Count 1.10 - 6.33 x10 3/uL 9.54 (H)   Lymphocytes Absolute Automated 0.42 - 3.22 x10 3/uL 3.39 (H)   Monocytes Absolute Automated 0.21 - 0.85 x10 3/uL 1.56 (H)   Immature Granulocytes Absolute 0.00 - 0.07 x10 3/uL 0.43 (H)   Neutrophils Absolute 1.10 - 6.33 x10 3/uL 9.54 (H)   Glucose 70 - 100 mg/dL 962 (H)   BUN 7 - 21 mg/dL 26 (H)   Creatinine 0.4 - 1.0 mg/dL 3.6 (H)   EGFR >=95.2 WU/XLK/4.40 m2 11.9 (L)    Albumin 3.5 - 5.0 g/dL 1.5 (L)       Plan of Care  Patient is an 82 year old female with multiple medical problems including COPD, prior stroke in 2004, type 2 diabetes, RCC status post bilateral nephrectomy, [name], recently diagnosed urothelial cancer, prolonged hospitalization that started on June 28, 2022.  At that moment patient was admitted due to an elective cystectomy and completion nephrectomy with lower course complicated by small bowel enterotomy intraoperative hemorrhage 1 L EBL status post treatment for septic and hemorrhagic shock in the ICU.  Required CRRT at the time.  Infectious diseases been following since that time.  Was taken back to the OR on 12 June for small bowel anastomosis, abdominal closure and wound VAC.  Extubated June 13, downgraded to floor June 16.  Had right-sided permacath placed by IR on 26 June.  Upgraded back to ICU on 30 June due to lethargy.  Repeat MRI of the brain was conducted which was negative along with EEG.  Was downgraded once more and has been under hospitalist medicine since.  Had new onset A-fib RVR status post amiodarone infusion and Eliquis started 23 July 2022.  Was awaiting discharge.  Family paying out-of-pocket  for SNF as she did not seem to be rehab-able. Downgraded from ICU again on 24 July.  Has soft pressures recorded limited dialysis and limited amount of fluids pulled.  Has required albumin and volume provided back during dialysis.  Nephrology started midodrine initially just before dialysis however to change to twice a day to maintain blood pressures.  Started on Megace for appetite improvement.  General surgery curb sided to ask if the patient would be a candidate for PEG tube, due to the malnutrition the patient already is going through, unable to do PEG tube by surgical means.  Could be done in the future by GI if she will tolerate EGD.  Likely unable to tolerate sedation General otherwise due to very strict blood pressure requirements.   Patient's daughter, Jutta Mckelvy has been kept in touch every day about the patient's progress.  Wanted wound nurse to talk to her on day of discharge.  Wound nurse spoke with her on day of discharge along with MD who spoke with her daily as well.  Patient is okay for discharge from medical standpoint to skilled nursing facility, wound VAC will be continued at the skilled nursing facility.      Medications  Current Inpatient Medications with Last Dose Taken[1]    Primary Coverage:  Payor: MEDICARE [employer] / Plan: UHC MED ADV [phone] / Product Type: MANAGED MEDICARE /     UTILIZATION REVIEW CONTACT: Wendall Stade RN, BSN  Utilization Review   [facility]  [phone]   [phone]  Email: [email or web address]  Tax ID:  [id number]         NOTES TO REVIEWER:    This clinical review is based on/compiled from documentation provided by the treatment team within the patient's medical record.            [1]   Current Facility-Administered Medications   Medication Dose Route Frequency Last Rate Last Admin    0.9% NaCl infusion   Intravenous PRN        acetaminophen (TYLENOL) tablet 650 mg  650 mg Oral 4 times per day   650 mg at 08/16/22 1203    Or    acetaminophen (TYLENOL) 160 MG/5ML oral liquid 650 mg  650 mg Oral 4 times per day   650 mg at 08/15/22 1802    acetaminophen (TYLENOL) tablet 650 mg  650 mg Oral Q4H PRN   650 mg at 08/13/22 2135    Or    acetaminophen (TYLENOL) 160 MG/5ML oral liquid 650 mg  650 mg Oral Q4H PRN   650 mg at 08/11/22 1300    Or    acetaminophen (TYLENOL) suppository 650 mg  650 mg Rectal Q4H PRN        albuterol-ipratropium (DUO-NEB) 2.5-0.5(3) mg/3 mL nebulizer 3 mL  3 mL Nebulization Q6H PRN        amiodarone (PACERONE) tablet 200 mg  200 mg per NG tube Daily   200 mg at 08/16/22 1604    apixaban (ELIQUIS) tablet 2.5 mg  2.5 mg Oral Q12H SCH   2.5 mg at 08/16/22 0951    atorvastatin (LIPITOR) tablet 40 mg  40 mg Oral QHS   40 mg at 08/15/22 2040    b complex-vitamin c-folic  acid (NEPHRO-VITE) tablet 1 tablet  1 tablet Oral Daily   1 tablet at 08/16/22 0951    benzocaine (HURRICAINE) 20 % mouth spray 1 spray  1 spray Mouth/Throat TID PRN   1 spray at 07/04/22 1102  calcium carbonate (TUMS) chewable tablet 500 mg  500 mg Oral Q4H PRN   500 mg at 08/14/22 0858    darbepoetin alfa (ARANESP) injection 40 mcg  0.45 mcg/kg Subcutaneous Weekly   40 mcg at 08/03/22 1613    dextrose (GLUCOSE) 40 % oral gel 15 g of glucose  15 g of glucose Oral PRN        Or    dextrose (D10W) 10% bolus 125 mL  12.5 g Intravenous PRN        Or    dextrose 50 % bolus 12.5 g  12.5 g Intravenous PRN        Or    glucagon (rDNA) (GLUCAGEN) injection 1 mg  1 mg Intramuscular PRN        diphenhydrAMINE (BENADRYL) injection 25 mg  25 mg Intravenous Q6H PRN   25 mg at 08/15/22 2318    hydrALAZINE (APRESOLINE) injection 10 mg  10 mg Intravenous Q3H PRN   10 mg at 07/26/22 0840    labetalol (NORMODYNE,TRANDATE) injection 10 mg  10 mg Intravenous Q15 Min PRN   10 mg at 07/23/22 0517    lidocaine (XYLOCAINE) 1 % injection 20 mL  20 mL Intradermal Once        lidocaine 2% urethral/mucosal jelly   Each Nare Once PRN        megestrol acetate (MEGACE) 400 MG/10ML oral suspension 400 mg  400 mg Oral Daily   400 mg at 08/16/22 1154    midodrine (PROAMATINE) tablet 10 mg  10 mg Oral Q Tue/Thu/Sat (AM)   10 mg at 08/14/22 0858    midodrine (PROAMATINE) tablet 5 mg  5 mg Oral BID Meals   5 mg at 08/16/22 1604    naloxone (NARCAN) injection 0.4 mg  0.4 mg Intravenous PRN        ondansetron (ZOFRAN) injection 4 mg  4 mg Intravenous Q6H PRN   4 mg at 08/15/22 1006    pantoprazole (PROTONIX) 40 mg/20 mL oral suspension 40 mg  40 mg per NG tube Q12H SCH   40 mg at 08/16/22 0951    phenol (CHLORASEPTIC) liquid 1 spray  1 spray Oral Q2H PRN   1 spray at 07/22/22 0613    polyethylene glycol (MIRALAX) packet 17 g  17 g Oral Daily   17 g at 08/15/22 0954    senna-docusate (PERICOLACE) 8.6-50 MG per tablet 1 tablet  1 tablet Oral QHS   1  tablet at 08/15/22 2040    thiamine (VITAMIN B1) tablet 100 mg  100 mg per NG tube Daily   100 mg at 08/16/22 0951    traMADol (ULTRAM) tablet 50 mg  50 mg Oral Q8H PRN   50 mg at 08/16/22 1604    umeclidinium-vilanterol (ANORO ELLIPTA) 62.5-25 MCG/ACT 1 puff  1 puff Inhalation QAM   1 puff at 08/16/22 [phone]

## 2022-08-16 NOTE — Progress Notes (Signed)
Update as follows:    CM requested PT/OT Stat for Dispo. Cm will continue to follow.     ,MSW,Supervisee in Marketing executive I

## 2022-08-16 NOTE — OT Progress Note (Signed)
Occupational Therapy Treatment  Shelby Barron        Post Acute Care Therapy Recommendations   Discharge Recommendations:  SNF      DME needs IF patient is discharging home: Hospital bed, [facility], [facility]    Therapy discharge recommendations may change with patient status.  Please refer to most recent note for up-to-date recommendations.    Unit: [facility]  Bed: A2703/A2703-01    ___________________________________________________    Time of treatment:  OT Received On: 08/16/22  Start Time: 1115  Stop Time: 1145  Time Calculation (min): 30 min       Chart Review and Collaboration with Care Team: 5 minutes, not included in above time.    OT Visit Number: 4      Precautions and Contraindications:    Precautions  Weight Bearing Status: no restrictions  Aspiration Precautions: see SLP recommendations  Fall Risks: High;Impaired balance/gait;Impaired mobility  Seizure Precautions: Yes (comment date of last seizure)  Other Precautions: L hemiplegia, wound vac on abdomen, sacral wounds    Personal Protective Equipment (PPE)  gloves and procedure mask    Updated Labs:  Lab Results   Component Value Date/Time    HGB 7.3 (L) 08/16/2022 03:51 AM    HGB 8.6 (L) 06/03/2022 04:23 AM    HCT 23.0 (L) 08/16/2022 03:51 AM    HCT 26.8 (L) 06/03/2022 04:23 AM    K 4.7 08/16/2022 03:51 AM    K 3.6 06/03/2022 04:23 AM    NA 140 08/16/2022 03:51 AM    NA 137 06/03/2022 04:23 AM    INR 1.1 07/14/2022 04:35 AM    INR 1.1 06/01/2022 03:15 AM    TROPI 7.1 08/10/2022 10:36 PM    TROPI 9.6 05/26/2022 06:37 PM    TROPI -2.2 05/26/2022 06:37 PM    TROPI 11.8 05/26/2022 03:26 PM       All imaging reviewed, please see chart for details.    Subjective: "I'm better."          Patient's medical condition is appropriate for Occupational Therapy intervention at this time.  Patient is agreeable to participation in the therapy session. Nursing clears patient for therapy.  Pain Assessment  Pain  Assessment: No/denies pain      Objective:  Observation of Patient/Vital Signs:  Stable      Cognition/Neuro Status  Arousal/Alertness: Delayed responses to stimuli  Attention Span: Attends to task with redirection  Orientation Level: Oriented to person;Oriented to place;Disoriented to time;Disoriented to situation  Memory: Decreased recall of recent events;Decreased short term memory  Following Commands: Follows one step commands without difficulty  Safety Awareness: moderate verbal instruction  Insights: Not aware of deficits  Problem Solving: dependent  Behavior: calm;cooperative  Motor Planning: decreased initiation;decreased processing speed  Coordination: FMC impaired;GMC impaired    Functional Mobility  Rolling: Maximal Assist;to left;to right (X2)  Scooting Transfers: Maximal Assist;additional time (X2)  Supine to Sit Transfers: Maximal Assist;additional time (X2)  Sit to Supine Transfers: Maximal Assist;additional time (X2)  Sit to Stand Transfers: not tested    Self-care and Home Management  Grooming: Moderate Assist;in bed;wash/dry face (w/ RUE)  Toileting: Dependent                                    Educated the Patient to role of occupational therapy, plan of care, goals of therapy and safety with mobility  and ADLs, energy conservation techniques with verbalized understanding  and demonstrated understanding.     Patient left in bed with alarm and all other medical equipment in place and call bell and all personal items/needs within reach.  RN notified of session outcome.         Assessment: Pt is progressing towards OT goals. Main limitations are decreased activity endurance, decreased functional mobility, weakness and fatigue limiting participation in functional mobility and ADL tasks. Pt has demonstrated an increase in alertness and sitting balance and EOB. Pt would benefit from continued OT services in order to maximize potential. Therapist continues to recommend [place] to SNF.                PMP  Activity: Step 4 - Dangle at Bedside  Distance Walked (ft) (Step 6,7): 0 Feet      Plan:  OT Frequency Recommended: 2-3x/wk  Goal Formulation: Patient      Time For Goal Achievement: by time of discharge  ADL Goals  Patient will groom self: Moderate Assist, Not met     Neuro Re-Ed Goals  Pt will sit at edge of bed: Maximal Assist, for 3 minutes, Partly met  Other Goal: Pt will follow one step v commands 50% of the time  Musculoskeletal Goals  Pt will perform Home Exercise Program: maximal assist, with caregiver/family assist, Goal met  Executive Fucntion Goals  Pt will demonstrate attention to task: with minimal assist, to increase ability to complete ADLs, Not met  Other Goal: Pt will be oriented X 3 75% of the time; not met              Continue plan of care.      Gerhard Munch, COTA/L    Physical Medicine and Rehabilitation  [facility]  [phone]    08/16/2022  11:58 AM    [facility]  Patient: Shelby Barron MRN#: [id number]   Unit: [facility] Bed: N8295/A2130-86

## 2022-08-16 NOTE — Plan of Care (Signed)
Problem: Impaired Mobility  Goal: Mobility/Activity is maintained at optimal level for patient  Flowsheets (Taken 08/15/2022 1957 by Davy Pique, RN)  Mobility/activity is maintained at optimal level for patient:   Encourage independent activity per ability   Consult/collaborate with Physical Therapy and/or Occupational Therapy

## 2022-08-16 NOTE — PT Eval Note (Signed)
Physical Therapy Re-evaluation  Shelby Barron      Post Acute Care Therapy Recommendations   Discharge Recommendations:  SNF    DME needs IF patient is discharging home: No additional equipment/DME recommended at this time    Therapy discharge recommendations may change with patient status.  Please refer to most recent note for up-to-date recommendations.    Unit:  [facility]  Bed: A2703/A2703-01    ___________________________________________________    Time of treatment:  PT Received On: 08/16/22  Start Time: 1140  Stop Time: 1150  Time Calculation (min): 10 min       Chart Review and Collaboration with Care Team: 8 minutes, not included in above time.    PT Visit Number: 1    Consult received for Shelby Barron for PT Re-Evaluation and Treatment.  Patient's medical condition is appropriate for Physical therapy intervention at this time.    Activity Orders:  OT eval and treat, PT eval and treat, and activity as tolerated    Precautions and Contraindications:  Precautions  Weight Bearing Status: no restrictions  Aspiration Precautions: see SLP recommendations  Fall Risks: High, Impaired balance/gait, Impaired mobility  Seizure Precautions: Yes (comment date of last seizure)  Communication Precautions: Speak loudly, Speak slowly  Other Precautions: L hemiplegia, wound vac on abdomen, sacral wounds    Personal Protective Equipment (PPE)  gloves    Medical Diagnosis:  Malignant neoplasm of urinary bladder, unspecified site [C67.9]  Malignant tumor of kidney, 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 female who required re-evaluation secondary to patient requiring physical therapy evaluation for discharge recommendation.    Updated X-Rays/Tests/Labs:  Lab Results   Component Value Date/Time    HGB 7.3 (L) 08/16/2022 03:51 AM    HGB 8.6 (L) 06/03/2022 04:23 AM    HCT 23.0 (L) 08/16/2022 03:51 AM    HCT 26.8 (L) 06/03/2022  04:23 AM    K 4.7 08/16/2022 03:51 AM    K 3.6 06/03/2022 04:23 AM    NA 140 08/16/2022 03:51 AM    NA 137 06/03/2022 04:23 AM    INR 1.1 07/14/2022 04:35 AM    INR 1.1 06/01/2022 03:15 AM    TROPI 7.1 08/10/2022 10:36 PM    TROPI 9.6 05/26/2022 06:37 PM    TROPI -2.2 05/26/2022 06:37 PM    TROPI 11.8 05/26/2022 03:26 PM       All imaging reviewed, please see chart for details.    Social History:  Please see Prior Evaluation       Subjective:  Patient is agreeable to participation in the therapy session. Nursing clears patient for therapy.   Patient Goal: pt wants to walk      Objective:  Observation of Patient/Vital Signs:  Blood pressure 121/60, pulse 91, temperature 99.2 F (37.3 C), temperature source Axillary, resp. rate 21, height 1.575 m (5\' 2" ), weight 85.4 kg (188 lb 4.4 oz), SpO2 95%.      Cognitive Status and Neuro Exam:     Cognition/Neuro Status  Arousal/Alertness: Delayed responses to stimuli  Attention Span: Attends to task with redirection  Orientation Level: Oriented to person;Oriented to place;Disoriented to time;Disoriented to situation  Memory: Decreased recall of recent events;Decreased short term memory  Following Commands: Follows one step commands without difficulty  Safety Awareness: moderate verbal instruction  Insights: Not aware of deficits  Problem Solving: dependent  Behavior: calm;cooperative  Motor Planning: decreased initiation;decreased processing speed  Coordination: FMC impaired;GMC impaired  Neuro Status RETIRED  Behavior: calm;cooperative  Motor Planning: decreased initiation;decreased processing speed  Coordination: FMC impaired;GMC impaired    Musculoskeletal Examination  Gross ROM  Right Upper Extremity ROM: within functional limits (PROM)  Left Upper Extremity ROM: within functional limits (PROM)  Right Lower Extremity ROM: within functional limits (PROM)  Left Lower Extremity ROM: within functional limits (PROM)        Gross Strength  Right Lower Extremity Strength: unable  to assess (not repsponding to commands)  Left Lower Extremity Strength: unable to assess (not repsponding to commands)       Functional Mobility:  Functional Mobility  Supine to Sit: Maximal Assist (x2)  Scooting to HOB: Dependent (x2)  Scooting to EOB: Maximal Assist  Sit to Supine: Maximal Assist (x2)        Patient required incr assist to transfer supine>sit. Max A in sitting to maintain balance as pt w/ significant posterior lean.     Balance  Balance  Sitting - Static: Poor  Sitting - Dynamic: Not tested    Participation and Activity Tolerance       mRS Scale - mRS Certified Providers  I am certified in mRS: Yes  mRS Assessment Source: Patient  mRS Value: 5 - Severe disability        Educated the Patient to role of physical therapy, plan of care, goals of therapy and safety with mobility and ADLs with limited indication of understanding.  Additional education required..     Patient left in bed with alarm and all other medical equipment in place and call bell and all personal items/needs within reach.  RN notified of session outcome. CM team and attending have been previously notified of d/c recommendation; no updates to d/c recommendation since that time.    Assessment:  Shelby Barron is a 82 y.o. female admitted 06/28/2022.  PT Assessment  Assessment: Decreased UE strength;Decreased LE strength;Decreased safety/judgement during functional mobility;Decreased cognition;Decreased endurance/activity tolerance;Impaired motor control;Decreased functional mobility;Decreased balance;Gait impairment  Prognosis: Poor;With continued PT status post acute discharge  Progress: Slow progress, decreased activity tolerance      Plan:  Treatment/Interventions: Exercise;Gait training;Stair training;Neuromuscular re-education;Functional transfer training;LE strengthening/ROM;Endurance training;Cognitive reorientation;Patient/family training;Equipment eval/education;Bed mobility;Continued evaluation   PT Frequency: 2-3x/wk    Risks/Benefits/POC Discussed with Pt/Family: With patient/family     PMP Activity: Step 4 - Dangle at Bedside  Distance Walked (ft) (Step 6,7): 0 Feet      Goals:   Goals  Goal Formulation: With patient/family  Time for Goal Acheivement: By time of discharge  Pt Will Go Supine To Sit: to maximize functional mobility and independence;with moderate assist;Partly met  Pt Will Sit Edge of Bed: 1-2 min;to maximize functional mobility and independence;with moderate assist;Partly met  Pt Will Perform Sit to Stand: to maximize functional mobility and independence;with moderate assist;Not met  Pt Will Transfer Bed/Chair: with moderate assist;to maximize functional mobility and independence;Not met  Pt Will Ambulate: 31-50 feet;with rolling walker;with moderate assist;to maximize functional mobility and independence;Discontinued (comment) (no longer appropriate for current medical status)      Nicoletta Dress, PT, DPT  08/16/2022  11:58 AM    Physical Therapist  Physical Medicine and Rehabilitation  [place], [place], Thurs 8-4:30pm  [place] Fri 12:30-9:00pm  [id number]    [facility]  Patient: Shelby Barron MRN#: [id number]  Unit: [facility] Bed: [id number]/V5643-32

## 2022-08-16 NOTE — Progress Notes (Signed)
No transportation came and pick up pt.  Pt's family requested for pt to stay over night.   Md notified, charge RN notified.

## 2022-08-16 NOTE — Progress Notes (Signed)
[facility] medical transport called to transport pt out of the ICU.Nurse consulted the charge nurse(Rhodora) who confirmed with the supervisor Erskine Squibb) that pt will not be transported tonight as patient's daughter expressed concerns of pt leaving before dialysis tomorrow due to a past incident of pt becoming hypotensive after dialysis. [facility] medical transport was updated(Klynn)

## 2022-08-16 NOTE — Progress Notes (Signed)
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 s/p bilateral radical nephrectomies and  cystectomy on 06/28/22  - complicated by small bowel enterotomy and blood loss   New onset afib - on amiodarone and eliquis  High grade urothelial bladder CA: s/p bilateral nephrectomies and cystectomy c/b mall bowel enterotomy and blood loss (~1L) on 06/28/22  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 - remains low  - on aranesp  Encephalopathy - persistent - episode of unresponsive last night   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   HTN - BP labile    Recommendations:   HD tomorrow  Will start midodrine 5 mg bid  Continue midodrine 10 mg prior to dialysis   Plan for discharge to [facility] - we will continue to follow her there     Case discussed with: patient, daughter    Debara Pickett, MD  [facility]  [phone] (office)  [phone] ([facility] Spectra-Link)      Subjective: awake    Review of Systems:   No chest pain, no SOB    Physical Exam:     Vitals:    08/16/22 0600 08/16/22 0800 08/16/22 1000 08/16/22 1200   BP: 114/58 123/57 121/60 99/53   Pulse: 78 78 91 84   Resp: 22 22 21 22    Temp:  99.2 F (37.3 C)  99 F (37.2 C)   TempSrc:  Axillary  Axillary   SpO2: 96% 96% 95% 95%   Weight:       Height:           Intake and Output Summary (Last 24 hours) at Date Time    Intake/Output Summary (Last 24 hours) at 08/16/2022 1300  Last data filed at 08/16/2022 0600  Gross per 24 hour   Intake 400 ml   Output 0 ml   Net 400 ml         Genera:  awake, alert   Cardiovascular: regular rate and rhythm  Lungs: CTA B anteriorly without rhonchi or wheezing  Abdomen: soft, midline wound with clear dressing and VAC in place  Extremities: no edema, heel protectors in place  Access: R permacath     Meds:      Scheduled Meds: PRN Meds:    acetaminophen, 650 mg, Oral, 4 times per day   Or  acetaminophen, 650 mg, Oral, 4 times per day  amiodarone, 200 mg, per NG tube, Daily  apixaban, 2.5 mg, Oral, Q12H SCH  atorvastatin, 40 mg, Oral, QHS  b complex-vitamin c-folic acid, 1 tablet, Oral, Daily  darbepoetin alfa, 0.45 mcg/kg, Subcutaneous, Weekly  lidocaine, 20 mL, Intradermal, Once  megestrol, 400 mg, Oral, Daily  midodrine, 10 mg, Oral, Q Tue/Thu/Sat (AM)  pantoprazole, 40 mg, per NG tube, Q12H SCH  polyethylene glycol, 17 g, Oral, Daily  senna-docusate, 1 tablet, Oral, QHS  thiamine, 100 mg, per NG tube, Daily  umeclidinium-vilanterol, 1 puff, Inhalation, QAM  Continuous Infusions:         sodium chloride, , PRN  acetaminophen, 650 mg, Q4H PRN   Or  acetaminophen, 650 mg, Q4H PRN   Or  acetaminophen, 650 mg, Q4H PRN  albuterol-ipratropium, 3 mL, Q6H PRN  benzocaine, 1 spray, TID PRN  calcium carbonate, 500 mg, Q4H PRN  dextrose, 15 g of glucose, PRN   Or  dextrose, 12.5 g, PRN   Or  dextrose, 12.5 g, PRN   Or  glucagon (rDNA), 1 mg, PRN  diphenhydrAMINE, 25 mg, Q6H PRN  hydrALAZINE, 10 mg, Q3H PRN  labetalol, 10 mg, Q15 Min PRN  lidocaine 2% jelly, , Once PRN  naloxone, 0.4 mg, PRN  ondansetron, 4 mg, Q6H PRN  phenol, 1 spray, Q2H PRN  traMADol, 50 mg, Q8H PRN              Labs:     Recent Labs   Lab 08/16/22  0351 08/15/22  0309 08/14/22  0531   WBC 14.97* 14.98* 14.58*   Hemoglobin 7.3* 8.1* 8.7*   Hematocrit 23.0* 25.5* 27.3*   Platelet Count 367* 363* 363*     Recent Labs   Lab 08/16/22  0351 08/15/22  0309 08/14/22  0531   Sodium 140 142 144   Potassium 4.7 3.9 3.8   Chloride 101 103 102   CO2 24 26 25    BUN 26* 16 29*   Creatinine 3.6* 2.7* 4.0*    Calcium 8.9 8.8 8.9   Albumin 1.5* 1.8* 1.4*   Phosphorus 2.6 2.3 3.3   Magnesium 1.9 1.8 1.8   Glucose 119* 103* 113*   GFR 11.9* 16.8* 10.5*           Signed by: Debara Pickett, MD

## 2022-08-16 NOTE — Plan of Care (Signed)
Surgery Plan of Care Note:  Re-engaged to discuss possibility of surgical G-tube placement. Patient currently poor surgical candidate in setting of recent bilateral nephrectomy with cystectomy, requiring small bowel resection for enterotomy. Also with other underlying comorbidities including afib, diastolic heart failure, now [name], and COPD. Can consider GI consultation for percutaneous G-tube placement (different method of placement from surgical G-tube) with EGD.    Kinnie Scales. Deno Etienne, MD  PGY-4 Surgery

## 2022-08-16 NOTE — Discharge Summary (Signed)
[facility]      Patient: Shelby Barron  Admission Date: 06/28/2022   DOB: 05/27/1940  Discharge Date: 08/16/2022    MRN: [id number]  Discharge Attending:Julianna Vanwagner Thedore Mins, MD     Referring Physician: Patsy Lager, MD  PCP: Shelby Lager, MD       DISCHARGE SUMMARY     Discharge Information   Admission Diagnosis:   Bladder cancer    Discharge Diagnosis:   Hypotension  Encephalopathy  Acute toxic metabolic encephalopathy/delirium  Possible underlying cognitive impairment  Anemia of CKD  Acute blood loss anemia  Status post open cystostomy, bilateral nephrectomies, resection of small bowel after IntraOp enterotomy  Mixed shock, hemorrhagic versus septic  Necrotic wound tissue  Acute CVA  Petechial hemorrhage versus cortical laminar necrosis  Acute hypoxic respiratory failure  COPD  ESRD  A-fib with RVR  Renal cell carcinoma  Urothelial carcinoma  Overweight  Hypercoagulable state  Severe malnutrition    Malnutrition Documentation    Severe malnutrition related to inadequate oral intake in the setting of acute illness as evidenced by PO intake meeting <50% of needs for >5 days, moderate-severe muscle loss (temporalis, deltoid, interosseous, quadriceps, gastrocnemius, trapezius, supraspinatus, infraspinatus), moderate fat loss (orbital, buccal, triceps), and 13% weight loss in >1 month         Admission Condition: Acute distress  Discharge Condition: Stable yet guarded  Consultants: Palliative medicine, infectious disease, GI, cardiology, general surgery, nephrology, critical care, CV IR  Functional Status: Stable  Discharged to: [facility]       Hospital Course   Presentation History   Per admission H&P:    Hospital Course (49 Days)     Patient is an 82 year old female with multiple medical problems including COPD, prior stroke in 2004, type 2 diabetes, RCC status post bilateral nephrectomy, [name], recently diagnosed urothelial cancer, prolonged hospitalization that started on June 28, 2022.  At that moment patient  was admitted due to an elective cystectomy and completion nephrectomy with lower course complicated by small bowel enterotomy intraoperative hemorrhage 1 L EBL status post treatment for septic and hemorrhagic shock in the ICU.  Required CRRT at the time.  Infectious diseases been following since that time.  Was taken back to the OR on 12 June for small bowel anastomosis, abdominal closure and wound VAC.  Extubated June 13, downgraded to floor June 16.  Had right-sided permacath placed by IR on 26 June.  Upgraded back to ICU on 30 June due to lethargy.  Repeat MRI of the brain was conducted which was negative along with EEG.  Was downgraded once more and has been under hospitalist medicine since.  Had new onset A-fib RVR status post amiodarone infusion and Eliquis started 23 July 2022.  Was awaiting discharge.  Family paying out-of-pocket for SNF as she did not seem to be rehab-able. Downgraded from ICU again on 24 July.  Has soft pressures recorded limited dialysis and limited amount of fluids pulled.  Has required albumin and volume provided back during dialysis.  Nephrology started midodrine initially just before dialysis however to change to twice a day to maintain blood pressures.  Started on Megace for appetite improvement.  General surgery curb sided to ask if the patient would be a candidate for PEG tube, due to the malnutrition the patient already is going through, unable to do PEG tube by surgical means.  Could be done in the future by GI if she will tolerate EGD.  Likely unable to tolerate sedation General otherwise  due to very strict blood pressure requirements.  Patient's daughter, Shelby Barron has been kept in touch every day about the patient's progress.  Wanted wound nurse to talk to her on day of discharge.  Wound nurse spoke with her on day of discharge along with MD who spoke with her daily as well.  Patient is okay for discharge from medical standpoint to skilled nursing facility, wound VAC will  be continued at the skilled nursing facility.  Patient is okay to discharge to SNF           Progress Note/Physical Exam at Discharge     Subjective:Patient is stable for discharge.    Vitals:    08/16/22 0600 08/16/22 0800 08/16/22 1000 08/16/22 1200   BP: 114/58 123/57 121/60 99/53   Pulse: 78 78 91 84   Resp: 22 22 21 22    Temp:  99.2 F (37.3 C)  99 F (37.2 C)   TempSrc:  Axillary  Axillary   SpO2: 96% 96% 95% 95%   Weight:       Height:               General: NAD, AAOx2  HEENT: Sclera anicteric  Neck: Supple  Cardiovascular: RRR  Lungs: Decreased breath sounds at bases, no crackles or rhonchi noted  Abdomen: Soft, +BS  Extremities: No significant pitting edema  Neuro: No Focal neurological deficits       Diagnostics     Labs/Studies Pending at Discharge: No    Last Labs   Recent Labs   Lab 08/16/22  0351 08/15/22  0309 08/14/22  0531   WBC 14.97* 14.98* 14.58*   RBC 2.44* 2.74* 2.95*   Hemoglobin 7.3* 8.1* 8.7*   Hematocrit 23.0* 25.5* 27.3*   MCV 94.3 93.1 92.5   Platelet Count 367* 363* 363*       Recent Labs   Lab 08/16/22  0351 08/15/22  0309 08/14/22  0531 08/13/22  0322 08/12/22  0521   Sodium 140 142 144 142 143   Potassium 4.7 3.9 3.8 3.7 3.8   Chloride 101 103 102 103 105   CO2 24 26 25 28 23    BUN 26* 16 29* 15 32*   Creatinine 3.6* 2.7* 4.0* 2.9* 4.7*   Glucose 119* 103* 113* 96 91   Calcium 8.9 8.8 8.9 8.2 8.7   Magnesium 1.9 1.8 1.8 1.7 1.8       Microbiology Results (last 15 days)       Procedure Component Value Units Date/Time    Culture, Blood, Aerobic And Anaerobic [[id number]] Collected: 08/11/22 0208    Order Status: Completed Specimen: Blood, Venous Updated: 08/16/22 0700     Culture Blood No growth at 5 days    Culture, Blood, Aerobic And Anaerobic [[id number]] Collected: 08/11/22 0208    Order Status: Completed Specimen: Blood, Venous Updated: 08/16/22 0700     Culture Blood No growth at 5 days             Patient Instructions   Discharge Diet: Full liquids with evaluation by SLP at the  skilled nursing facility and advancement as tolerated  Discharge Activity: Per SNF    Follow Up Appointment:   Follow-up Information       [facility] [place]. Call.    Specialty: Gastroenterology  Why: please call to make f/u in 6 weeks.  Contact information:  [address]  [place] [address]  [phone]  Additional information:  Take [address]  to [address] [place].  Turn right on [address] and then right again at [employer].  Destination will be on your right.               Alla German, MD Follow up.    Specialties: Nephrology, Internal Medicine  Contact information:  [address]  [place] [address]  [phone]               [facility] -[facility] Follow up in 1 month(s).    Specialty: Cardiology  Contact information:  [address]  [place] [address]  [phone]             Marlaine Hind, MD Follow up in 1 week(s).    Specialty: Surgery  Contact information:  [address]  [place] [address]  [phone]               [facility] Follow up in 1 week(s).    Contact information:  [address]  [address] [address]             Pcp, Kathreen Cosier, MD .                              Discharge Medications:     Medication List        START taking these medications      amiodarone 200 MG tablet  Commonly known as: PACERONE  Take 1 tablet (200 mg) by mouth daily This is a maintenance dose.     apixaban 2.5 MG  Commonly known as: ELIQUIS  Take 1 tablet (2.5 mg) by mouth every 12 (twelve) hours     b complex-vitamin c-folic acid 0.8 MG Tabs  Take 1 tablet by mouth daily     carvedilol 12.5 MG tablet  Commonly known as: COREG  Take 1 tablet (12.5 mg) by mouth every 12 (twelve) hours     * megestrol 40 MG tablet  Commonly known as: MEGACE  Take 1 tablet (40 mg) by mouth 4 (four) times daily     * megestrol acetate 400 MG/10ML  Susp  Commonly known as: MEGACE  Take 10 mLs (400 mg) by mouth daily  Start taking on: August 17, 2022     * midodrine 10 MG tablet  Commonly known as: PROAMATINE  Take 1 tablet (10 mg) by mouth Once daily every Tuesday, Thursday and Saturday morning     * midodrine 5 MG tablet  Commonly known as: PROAMATINE  Take 1 tablet (5 mg) by mouth 2 (two) times daily with meals     pantoprazole 40 mg/20 mL Susp  Commonly known as: PROTONIX  Take 20 mLs (40 mg) by mouth every 12 (twelve) hours  Replaces: pantoprazole 40 MG tablet     * polyethylene glycol 17 g packet  Commonly known as: MIRALAX  Take 17 g by mouth daily as needed (constipation)     * polyethylene glycol 17 g packet  Commonly known as: MIRALAX  Take 17 g by mouth daily     traMADol 50 MG tablet  Commonly known as: ULTRAM  Take 1 tablet (50 mg) by mouth every 8 (eight) hours as needed for Pain           * This list has 6 medication(s) that are the same as other medications  prescribed for you. Read the directions carefully, and ask your doctor or other care provider to review them with you.                CONTINUE taking these medications      Anoro Ellipta 62.5-25 MCG/ACT Aepb  Generic drug: umeclidinium-vilanterol     atorvastatin 40 MG tablet  Commonly known as: LIPITOR     cinacalcet 30 MG tablet  Commonly known as: SENSIPAR     conjugated estrogens vaginal cream  Commonly known as: PREMARIN     montelukast 10 MG tablet  Commonly known as: SINGULAIR     ondansetron 4 MG tablet  Commonly known as: Zofran  Take 1 tablet (4 mg) by mouth every 8 (eight) hours as needed for Nausea     oxybutynin XL 5 MG 24 hr tablet  Commonly known as: DITROPAN-XL     sevelamer carbonate 800 MG tablet  Commonly known as: RENVELA            STOP taking these medications      allopurinol 100 MG tablet  Commonly known as: ZYLOPRIM     AMBIEN PO     b complex vitamins capsule     clopidogrel 75 mg tablet  Commonly known as: PLAVIX     hyoscyamine 0.125 MG tablet  Commonly known as:  LEVSIN     insulin aspart 100 UNIT/ML injection  Commonly known as: NovoLOG     metoprolol tartrate 25 MG tablet  Commonly known as: LOPRESSOR     oxyCODONE 5 MG capsule  Commonly known as: OXY-IR     pantoprazole 40 MG tablet  Commonly known as: PROTONIX  Replaced by: pantoprazole 40 mg/20 mL Susp     pregabalin 50 MG capsule  Commonly known as: LYRICA     torsemide 100 MG tablet  Commonly known as: DEMADEX     Tresiba 100 UNIT/ML Soln  Generic drug: Insulin Degludec            ASK your doctor about these medications      oxyCODONE 5 MG immediate release tablet  Commonly known as: ROXICODONE  Take 0.5 tablets (2.5 mg) by mouth every 8 (eight) hours as needed for Pain  Ask about: Should I take this medication?               Where to Get Your Medications        These medications were sent to [id number] - [place], [place] - [address]  [address], [place] [address]      Phone: [phone]   megestrol 40 MG tablet  megestrol acetate 400 MG/10ML Susp  midodrine 10 MG tablet  midodrine 5 MG tablet  polyethylene glycol 17 g packet  traMADol 50 MG tablet       You can get these medications from any pharmacy    Bring a paper prescription for each of these medications  oxyCODONE 5 MG immediate release tablet       Information about where to get these medications is not yet available    Ask your nurse or doctor about these medications  amiodarone 200 MG tablet  apixaban 2.5 MG  b complex-vitamin c-folic acid 0.8 MG Tabs  carvedilol 12.5 MG tablet  pantoprazole 40 mg/20 mL Susp  polyethylene glycol 17 g packet          Time spent examining patient, discussing with patient/family regarding hospital course, chart review,  reconciling medications and discharge planning: 60 minutes.    Signed,  Donzetta Sprung, MD    1:46 PM 08/16/2022

## 2022-08-16 NOTE — Progress Notes (Signed)
Infectious Diseases & Tropical Medicine  Progress Note    08/16/2022   Shelby Barron ZDG:[id number],JOA:[id number] is a 82 y.o. female,       Assessment:     Encephalopathy  Acute CVA  Bladder cancer  Status post radical cystectomy/bilateral nephrectomy, enterectomy and lysis of adhesion (06/28/2022)  S/p small bowel resection  Abdominal wall wound VAC in place  End-stage renal disease on hemodialysis  Atrial fibrillation  Risk of aspiration    Plan:     Continue monitoring without antibiotics  Continue local wound care  Await G-tube placement  Follow-up chest x-ray  Continue probiotics  Monitor electrolytes and renal functions closely  Monitor clinically    ROS:     General: No fever, awake, confused  Respiratory: No cough or shortness of breath  Gastrointestinal: No diarrhea  Genito-Urinary: no hematuria  Musculoskeletal: no edema  Neurological: awake  Dermatological: No rash    Physical Examination:     Blood pressure 114/58, pulse 78, temperature 99.2 F (37.3 C), temperature source Axillary, resp. rate 22, height 1.575 m (5\' 2" ), weight 85.4 kg (188 lb 4.4 oz), SpO2 96%.    General Appearance: Comfortable, no new issues  HEENT: Pupils are equal, round, and reactive to light.   Lungs: Clear to auscultation  Heart: Regular rate and rhythm  Chest: Symmetric chest wall expansion.   Abdomen: soft, no hepatosplenomegaly, no bowel sounds, abdominal wall wound VAC in place  Neurological: awake  Extremities: No edema    Laboratory And Diagnostic Studies:     Recent Labs     08/16/22  0351 08/15/22  0309   WBC 14.97* 14.98*   Hemoglobin 7.3* 8.1*   Hematocrit 23.0* 25.5*   Platelet Count 367* 363*     Recent Labs     08/16/22  0351 08/15/22  0309   Sodium 140 142   Potassium 4.7 3.9   Chloride 101 103   CO2 24 26   BUN 26* 16   Creatinine 3.6* 2.7*   Glucose 119* 103*   Calcium 8.9 8.8     Recent Labs     08/16/22  0351 08/15/22  0309   Albumin 1.5* 1.8*         Current Meds:      Scheduled Meds: PRN Meds:     acetaminophen, 650 mg, Oral, 4 times per day   Or  acetaminophen, 650 mg, Oral, 4 times per day  amiodarone, 200 mg, per NG tube, Daily  apixaban, 2.5 mg, Oral, Q12H SCH  atorvastatin, 40 mg, Oral, QHS  b complex-vitamin c-folic acid, 1 tablet, Oral, Daily  darbepoetin alfa, 0.45 mcg/kg, Subcutaneous, Weekly  lidocaine, 20 mL, Intradermal, Once  megestrol, 400 mg, Oral, Daily  midodrine, 10 mg, Oral, Q Tue/Thu/Sat (AM)  pantoprazole, 40 mg, per NG tube, Q12H SCH  polyethylene glycol, 17 g, Oral, Daily  senna-docusate, 1 tablet, Oral, QHS  thiamine, 100 mg, per NG tube, Daily  umeclidinium-vilanterol, 1 puff, Inhalation, QAM        Continuous Infusions:       sodium chloride, , PRN  acetaminophen, 650 mg, Q4H PRN   Or  acetaminophen, 650 mg, Q4H PRN   Or  acetaminophen, 650 mg, Q4H PRN  albuterol-ipratropium, 3 mL, Q6H PRN  benzocaine, 1 spray, TID PRN  calcium carbonate, 500 mg, Q4H PRN  dextrose, 15 g of glucose, PRN   Or  dextrose, 12.5 g, PRN   Or  dextrose, 12.5 g, PRN   Or  glucagon (  rDNA), 1 mg, PRN  diphenhydrAMINE, 25 mg, Q6H PRN  hydrALAZINE, 10 mg, Q3H PRN  labetalol, 10 mg, Q15 Min PRN  lidocaine 2% jelly, , Once PRN  naloxone, 0.4 mg, PRN  ondansetron, 4 mg, Q6H PRN  phenol, 1 spray, Q2H PRN  traMADol, 50 mg, Q8H PRN          Shelby Barron A. Janalyn Rouse, M.D.  08/16/2022  9:47 AM

## 2022-08-16 NOTE — SLP Progress Note (Signed)
[facility]  Speech Therapy Treatment Note    Patient:  Shelby Barron MRN#:  [id number]  Unit:   [place] [facility]ARDIOVASCULAR [facility] NEURO INTENSIVE CARE Room/Bed:  A2703/A2703-01      Time of treatment:   SLP Received On: 08/16/22  Start Time: 0740  Stop Time: 0800  Time Calculation (min): 20 min       Personal Protective Equipment (PPE)  Procedure Mask with Shield    Subjective:   Pt is alert/ verbal , seen with lunch         Objective: Oropharyngeal analysis during PO trials in order to determine LRD,development and training of swallow strategies in order to improve swallow function and decrease risk of penetration/aspiration;  therapeutic feedings by SLP  and safe swallowing precautions education.       Assessment: pt presents with mild oral pharyngeal dysphagia characterized by delay in bolus transit , delay in pharyngeal swallow trigger and fairly adequate HLE upon subjective digital palpation. Pt tolerated 25 % of breakfast and 4 fl oz of Ensure via cup without s/s overt aspiration or penetration. Pt reported feeling sick therefore trial terminated            Plan/Recommendations:  IDDSI Pureed - PU4  and Mildly thick  MT2  Slow feeding rate, small bites / sips , upright position during and after the meals , Alternate solid and liquid , full assistance with meals , liquid wash, and check oral cavity for residue         Charges Minutes   SLP treat    Swallow treat 20       08/16/2022  Presley Raddle MS [facility] SLP   08/16/2022 2:59 PM  [phone]

## 2022-08-16 NOTE — Progress Notes (Signed)
CN made aware of transport not arriving. Attempted to make transport request, unable to submit. Spoke with Daughter and explained pt will not be transported tonight at [facility] has to coordinate with SNF. Daughter understanding, however is concerned as pt has HD scheduled tomorrow. Per dtr, when pt was to be discharge last week after HD, she was hypotensive and sent to ICU instead of d/c'd. Dtr would prefer her to be moved on a day without HD, for example 8/1. She states she has been offering to use private transport but was told transport was available. Currently, dtr would like to use private transport for 8/1, "friendly rides" was named. Daughter was reassured by RN that this would be escalated to night unit supervisor, nursing supervisor, and Armed forces technical officer.

## 2022-08-16 NOTE — Progress Notes (Signed)
Pt D/C to SNF [facility], room 119V, Report [phone]. The Pt will be transported by MMT at 2:00 PM. Family has been notified. Bedside and charge nurse has been notified.        08/16/22 1313   Discharge Disposition   Patient preference/choice provided? Yes   Physical Discharge Disposition SNF   Receiving facility, unit and room number: [facility], Room 119V   Nursing report phone number: [phone]   Mode of Transportation   (MMT (Time subject to change))   Pick up time 2:00 PM   Patient/Family/POA notified of transfer plan Yes;Patient informed only   Patient agreeable to discharge plan/expected d/c date? Yes   Family/POA agreeable to discharge plan/expected d/c date? Yes   Bedside nurse notified of transport plan? Yes     Cylee Dattilo,MSW,Supervisee in Marketing executive I

## 2022-08-17 ENCOUNTER — Encounter: Payer: Self-pay | Admitting: Critical Care Medicine

## 2022-08-17 LAB — CROSSMATCH PRBC, 1 UNIT
Expiration Date: 202409012359
ISBT CODE: 5100
Product Status: TRANSFUSED

## 2022-08-17 LAB — TYPE AND SCREEN
ABO Rh: O POS
Antibody Screen: NEGATIVE

## 2022-08-17 LAB — RENAL FUNCTION PANEL
Albumin: 1.4 g/dL — ABNORMAL LOW (ref 3.5–5.0)
Anion Gap: 16 — ABNORMAL HIGH (ref 5.0–15.0)
BUN: 39 mg/dL — ABNORMAL HIGH (ref 7–21)
CO2: 23 mEq/L (ref 17–29)
Calcium: 8.9 mg/dL (ref 7.9–10.2)
Chloride: 101 mEq/L (ref 99–111)
Creatinine: 4.8 mg/dL — ABNORMAL HIGH (ref 0.4–1.0)
GFR: 8.4 mL/min/{1.73_m2} — ABNORMAL LOW (ref >=60.0)
Glucose: 88 mg/dL (ref 70–100)
Phosphorus: 2.9 mg/dL (ref 2.3–4.7)
Potassium: 4.9 mEq/L (ref 3.5–5.3)
Sodium: 140 mEq/L (ref 135–145)

## 2022-08-17 LAB — CALCIUM, IONIZED: Calcium, Ionized: 2.4 mEq/L (ref 2.23–2.56)

## 2022-08-17 LAB — MAGNESIUM: Magnesium: 1.9 mg/dL (ref 1.6–2.6)

## 2022-08-17 LAB — WHOLE BLOOD GLUCOSE POCT
Whole Blood Glucose POCT: 83 mg/dL (ref 70–100)
Whole Blood Glucose POCT: 105 mg/dL — ABNORMAL HIGH (ref 70–100)
Whole Blood Glucose POCT: 112 mg/dL — ABNORMAL HIGH (ref 70–100)
Whole Blood Glucose POCT: 121 mg/dL — ABNORMAL HIGH (ref 70–100)

## 2022-08-17 MED ORDER — SODIUM CHLORIDE 0.9 % IV SOLN
INTRAVENOUS | Status: DC | PRN
Start: 2022-08-17 — End: 2022-08-18

## 2022-08-17 MED ORDER — SODIUM CHLORIDE 0.9 % IV BOLUS
250.0000 mL | INTRAVENOUS | Status: AC | PRN
Start: 2022-08-17 — End: 2022-08-17

## 2022-08-17 MED ORDER — SODIUM CHLORIDE 0.9 % IV BOLUS
100.0000 mL | INTRAVENOUS | Status: AC | PRN
Start: 2022-08-17 — End: 2022-08-17

## 2022-08-17 MED ORDER — ALBUMIN HUMAN 25 % IV SOLN
100.0000 mL | INTRAVENOUS | Status: AC | PRN
Start: 2022-08-17 — End: 2022-08-17
  Administered 2022-08-17: 25 g via INTRAVENOUS
  Filled 2022-08-17: qty 100

## 2022-08-17 NOTE — Progress Notes (Signed)
08/17/22 0930   Bedside Nurse Communication   Name of bedside RN - pre dialysis Luz Brazen, RN   Treatment Initiation- With Dialysis Precautions   Time Out/Safety Check Completed Yes   Consent for HD signed for this hospitalization (Date) 06/01/22   Consent for HD signed for this hospitalization (Time) 1516   Preferred language No   Complication waiver N   Blood Consent Verified N/A   Dialysis Precautions All Connections Secured   Dialysis Treatment Type Routine;ICU/CCU   Is patient diabetic? Yes   RO/Hemodialysis Cabin crew   Is Total Chlorine less than 0.1 ppm? Yes   Orignial Total Chlorine Testing Time 0930   At 4 Hour Total Chlorine Testing Time 1330   RO/Hemodialysis Arts development officer Number 8    Machine Serial Number [id number]   RO # 21   RO Serial # [id number]   Water Hardness NA   pH 7.2   Pressure Test Verified Yes   Alarms Verified Passed   Machine Temperature 98.1 F (36.7 C)   Alarms Verified Yes   Na+ mEq (Machine) 138 mEq   Bicarb mEq (Machine) 35 mEq   Hemodialysis Conductivity (Machine) 13.8   Hemodialysis Conductivity (Meter) 13.8   Dialyzer Lot Number [id number]   ex 06/17/2025   Tubing Lot Number [id number]   exp 06/17/2025   RO Machine Log Completed Yes   Hepatitis Status   HBsAg (Antigen) Result Negative   Dialysis Weight   Pre-Treatment Weight (Kg) 81.2   Scale Type ICU Bed Scale   Vitals   Temp 98.3 F (36.8 C)   Heart Rate 82   Resp Rate 20   BP 120/57   SpO2 94 %   O2 Device None (Room air)   Assessment   Mental Status Alert;Oriented;Restless   Cardiac (WDL) WDL   Cardiac Regularity Return to [facility]   Cardiac Symptoms Return to [facility]   Cardiac Rhythm Normal sinus rhythm   Respiratory  X   Respiratory Pattern Dyspnea with exertion;Shallow   Bilateral Breath Sounds Diminished   Cough No cough effort   Edema  WDL   General Skin Color Appropriate for ethnicity   Skin Temp Warm;Dry   Gastrointestinal (WDL) WDL   Abdomen Inspection Return to [facility]   GI Conditions Return to [facility]    Mobility Bed   Permacath Catheter - Tunneled 07/14/22 Subclavian vein catheter Right   Placement Date/Time: 07/14/22 1404   Inserted by: Dr. Welton Flakes  Access Type: Subclavian vein catheter  Orientation: Right  Central Line Infection Prevention Education provided?: Yes  Hand Hygiene: Alcohol based hand scrub;Soap and water  Line cart u...   Line necessity reviewed? Apheresis/hemodialysis   Site Assessment Clean;Dry;Intact   Catheter Lumen Volume Venous 1.6 mL   Catheter Lumen Volume Arterial 1.6 mL   Dressing Status and Intervention Dressing Intact   Tego/Curos Caps on Catheter Yes   NEW Tego/Curos Caps placed (Date) 08/17/22   APHERESIS ONLY:  Access  Red   APHERESIS ONLY: Return Blue   End Caps Free From Blood Yes   Line Care Connections checked and tightened   Dressing Type Biopatch;Transparent;Antimicrobial impregnated   Dressing Status Clean;Dry;Intact   Dressing/Line Intervention Caps changed   Line Used For Blood Draw Other (Comment)  (HD)   Dressing Change Due 08/23/22   Pain Assessment   Charting Type Assessment   Pain Scale Used Numeric Scale (0-10)   Numeric Pain Scale   Pain Score 0   POSS  Score 1   Hemodialysis Comments   Pre-Hemodialysis Comments Timeout and safety checks done for patient and orders. OK to proceed w/ planned HD this morning. bp soft, primary RN gave midodrine.

## 2022-08-17 NOTE — Plan of Care (Signed)
Problem: Moderate/High Fall Risk Score >5  Goal: Patient will remain free of falls  Outcome: Progressing  Flowsheets (Taken 08/17/2022 0000)  High (Greater than 13):   HIGH-Visual cue at entrance to patient's room   HIGH-Bed alarm on at all times while patient in bed   HIGH-Utilize chair pad alarm for patient while in the chair   HIGH-Apply yellow "Fall Risk" arm band   HIGH-Pharmacy to initiate evaluation and intervention per protocol   HIGH-Consider use of low bed   HIGH-Initiate use of floor mats as appropriate     Problem: Compromised Sensory Perception  Goal: Sensory Perception Interventions  Outcome: Progressing  Flowsheets (Taken 08/17/2022 0436)  Sensory Perception Interventions: Offload heels, Pad bony prominences, Reposition q 2hrs/turn Clock, Q2 hour skin assessment under devices if present     Problem: Compromised Moisture  Goal: Moisture level Interventions  Outcome: Progressing  Flowsheets (Taken 08/17/2022 0436)  Moisture level Interventions: Moisture wicking products, Moisture barrier cream     Problem: Compromised Activity/Mobility  Goal: Activity/Mobility Interventions  Outcome: Progressing  Flowsheets (Taken 08/17/2022 0436)  Activity/Mobility Interventions: Pad bony prominences, TAP Seated positioning system when OOB, Promote PMP, Reposition q 2 hrs / turn clock, Offload heels     Problem: Compromised Friction/Shear  Goal: Friction and Shear Interventions  Outcome: Progressing  Flowsheets (Taken 08/17/2022 0436)  Friction and Shear Interventions: Pad bony prominences, Off load heels, HOB 30 degrees or less unless contraindicated, Consider: TAP seated positioning, Heel foams     Problem: Impaired Mobility  Goal: Mobility/Activity is maintained at optimal level for patient  Outcome: Progressing  Flowsheets (Taken 08/17/2022 0436)  Mobility/activity is maintained at optimal level for patient: Encourage independent activity per ability     Problem: Hemodynamic Status: Cardiac  Goal: Stable vital signs  and fluid balance  Outcome: Progressing  Flowsheets (Taken 08/17/2022 0436)  Stable vital signs and fluid balance:   Assess signs and symptoms associated with cardiac rhythm changes   Monitor lab values     Problem: Diabetes: Glucose Imbalance  Goal: Blood glucose stable at established goal  Outcome: Progressing  Flowsheets (Taken 08/17/2022 0436)  Blood glucose stable at established goal:   Monitor lab values   Monitor intake and output.  Notify LIP if urine output is < 30 mL/hour.   Follow fluid restrictions/IV/PO parameters   Include patient/family in decisions related to nutrition/dietary selections   Assess for hypoglycemia /hyperglycemia   Coordinate medication administration with meals, as indicated   Ensure appropriate diet and assess tolerance   Monitor/assess vital signs   Ensure adequate hydration     Problem: Neurological Deficit  Goal: Neurological status is stable or improving  Outcome: Progressing  Flowsheets (Taken 08/17/2022 0436)  Neurological status is stable or improving:   Monitor/assess/document neurological assessment (Stroke: every 4 hours)   Monitor/assess NIH Stroke Scale   Re-assess NIH Stroke Scale for any change in status   Observe for seizure activity and initiate seizure precautions if indicated   Perform CAM Assessment     Problem: Every Day - Stroke  Goal: Mobility/Activity is maintained at optimal level for patient  Outcome: Progressing  Flowsheets (Taken 08/17/2022 0436)  Mobility/activity is maintained at optimal level for patient: Encourage independent activity per ability  Goal: Neurological status is stable or improving  Outcome: Progressing  Flowsheets (Taken 08/17/2022 0436)  Neurological status is stable or improving:   Monitor/assess/document neurological assessment (Stroke: every 4 hours)   Monitor/assess NIH Stroke Scale   Re-assess NIH Stroke Scale for  any change in status   Observe for seizure activity and initiate seizure precautions if indicated   Perform CAM  Assessment  Goal: Neurovascular status is stable or improving  Outcome: Progressing  Flowsheets (Taken 08/17/2022 0436)  Neurovascular status is stable or improving: Monitor/assess neurovascular status (pulses, capillary refill, pain, paresthesia, presence of edema)

## 2022-08-17 NOTE — Progress Notes (Signed)
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 s/p bilateral radical nephrectomies and  cystectomy on 06/28/22  - complicated by small bowel enterotomy and blood loss   New onset afib - on amiodarone and eliquis  High grade urothelial bladder CA: s/p bilateral nephrectomies and cystectomy c/b mall bowel enterotomy and blood loss (~1L) on 06/28/22  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 - remains low  - on aranesp  Encephalopathy - persistent - episode of unresponsive last night   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   HTN - BP labile    Recommendations:   Presently being dialyzed, unable to meet UF goal due to soft BP despite giving midodrine and albumin  Unable to run blood faster than 250cc/min due to elevated catheter pressure - per dialysis nurse, most likely due to patient being restless/positional   Continue midodrine 10 mg prior to dialysis   Plan for discharge to [facility] nursing facility - we will continue to follow her there     Case discussed with: patient,  HD RN    Dialysis Treatment Data:  Blood flow rate: 250 mL/min  Dialysate flow rate: 700 mL/min  Ultrafiltration rate: 430 mL/hr    Dialysate potassium: 2 mEq/L  Dialysate calcium: 2.5 mEq/L    Arterial pressure: -130 mmHg  Venous pressure:     Delice Bison, MD  [facility]  [phone] (office)  [phone] ([facility] Spectra-Link)      Subjective: awake, restless, seen on dialysis, BP soft     Review of Systems:   No chest pain, no SOB    Physical  Exam:     Vitals:    08/17/22 1245 08/17/22 1300 08/17/22 1315 08/17/22 1330   BP: 107/55 96/50 110/56 109/53   Pulse: 79 80 81 80   Resp: 20 (!) 23 (!) 26 (!) 24   Temp:       TempSrc:       SpO2: 97% 97% 98% 97%   Weight:       Height:           Intake and Output Summary (Last 24 hours) at Date Time    Intake/Output Summary (Last 24 hours) at 08/17/2022 1333  Last data filed at 08/17/2022 0800  Gross per 24 hour   Intake 10 ml   Output 0 ml   Net 10 ml         Genera: awake, alert   Cardiovascular: regular rate and rhythm  Lungs: CTA B anteriorly without rhonchi or wheezing  Abdomen: soft, midline wound with clear dressing and VAC in place  Extremities: no edema, heel protectors in place  Access: R permacath     Meds:      Scheduled Meds: PRN Meds:    acetaminophen, 650 mg, Oral, 4 times per day   Or  acetaminophen, 650 mg, Oral, 4 times per day  amiodarone, 200 mg, per NG tube, Daily  apixaban, 2.5 mg, Oral, Q12H SCH  atorvastatin, 40 mg, Oral, QHS  b complex-vitamin c-folic acid, 1 tablet, Oral, Daily  darbepoetin alfa, 0.45 mcg/kg, Subcutaneous, Weekly  lidocaine, 20 mL, Intradermal, Once  megestrol, 400 mg, Oral, Daily  midodrine, 10 mg, Oral, Q Tue/Thu/Sat (AM)  midodrine, 5 mg, Oral, BID Meals  pantoprazole, 40 mg, per NG tube, Q12H SCH  polyethylene glycol, 17 g, Oral, Daily  senna-docusate, 1 tablet, Oral, QHS  thiamine, 100 mg, per NG tube, Daily  umeclidinium-vilanterol, 1 puff, Inhalation, QAM          Continuous Infusions:         sodium chloride, , PRN  acetaminophen, 650 mg, Q4H PRN   Or  acetaminophen, 650 mg, Q4H PRN   Or  acetaminophen, 650 mg, Q4H PRN  albumin human, 100 mL, PRN  albuterol-ipratropium, 3 mL, Q6H PRN  benzocaine, 1 spray, TID PRN  calcium carbonate, 500 mg, Q4H PRN  dextrose, 15 g of glucose, PRN   Or  dextrose, 12.5 g, PRN   Or  dextrose, 12.5 g, PRN   Or  glucagon (rDNA), 1 mg, PRN  diphenhydrAMINE, 25 mg, Q6H PRN  hydrALAZINE, 10 mg, Q3H PRN  labetalol, 10 mg, Q15 Min  PRN  lidocaine 2% jelly, , Once PRN  naloxone, 0.4 mg, PRN  ondansetron, 4 mg, Q6H PRN  phenol, 1 spray, Q2H PRN  sodium chloride, 100 mL, Q1H PRN  sodium chloride, 250 mL, PRN  traMADol, 50 mg, Q8H PRN              Labs:     Recent Labs   Lab 08/16/22  0351 08/15/22  0309 08/14/22  0531   WBC 14.97* 14.98* 14.58*   Hemoglobin 7.3* 8.1* 8.7*   Hematocrit 23.0* 25.5* 27.3*   Platelet Count 367* 363* 363*     Recent Labs   Lab 08/17/22  0441 08/16/22  0351 08/15/22  0309   Sodium 140 140 142   Potassium 4.9 4.7 3.9   Chloride 101 101 103   CO2 23 24 26    BUN 39* 26* 16   Creatinine 4.8* 3.6* 2.7*   Calcium 8.9 8.9 8.8   Albumin 1.4* 1.5* 1.8*   Phosphorus 2.9 2.6 2.3   Magnesium 1.9 1.9 1.8   Glucose 88 119* 103*   GFR 8.4* 11.9* 16.8*           Signed by: Delice Bison, MD

## 2022-08-17 NOTE — Plan of Care (Signed)
Problem: Constipation  Goal: Fluid and electrolyte balance are achieved/maintained  Outcome: Progressing  Flowsheets (Taken 08/17/2022 1010)  Fluid and electrolyte balance are achieved/maintained:   Monitor/assess lab values and report abnormal values   Assess and reassess fluid and electrolyte status     Problem: Patient Receiving Advanced Renal Therapies  Goal: Therapy access site remains intact  Flowsheets (Taken 08/17/2022 1010)  Therapy access site remains intact:   Change therapy access site dressing as needed   Assess therapy access site     Problem: Fluid and Electrolyte Imbalance/ Endocrine  Goal: Fluid and electrolyte balance are achieved/maintained  Outcome: Progressing  Flowsheets (Taken 08/17/2022 1010)  Fluid and electrolyte balance are achieved/maintained:   Monitor/assess lab values and report abnormal values   Assess and reassess fluid and electrolyte status

## 2022-08-17 NOTE — Progress Notes (Addendum)
4 eyes in 4 hours pressure injury assessment note:      Completed with:   Unit & Time admitted:              Bony Prominences: Check appropriate box; if wound is present enter wound assessment in LDA     Occiput:                 [x] WNL  []  Wound present  Face:                     [x] WNL  []  Wound present  Ears:                      [x] WNL  []  Wound present  Spine:                    [x] WNL  []  Wound present  Shoulders:             [x] WNL  []  Wound present  Elbows:                  [x] WNL  []  Wound present  Sacrum/coccyx:     [] WNL  [x]  Wound present open wound/ redness  Ischial Tuberosity:  [] WNL  [x]  Wound present open wound/redness  Trochanter/Hip:      [x] WNL  []  Wound present  Knees:                   [x] WNL  []  Wound present  Ankles:                   [x] WNL  []  Wound present  Heels:                    [x] WNL  []  Wound present  Other pressure areas:  []  Wound location       Device related: []  Device name:         LDA completed if wound present: yes/no  Consult WOCN if necessary    Other skin related issues, ie tears, rash, etc, document in Integumentary flowsheet

## 2022-08-17 NOTE — Progress Notes (Signed)
LOS # [id number]      Summary of Discharge Plan: [facility] tomorrow 08/18/22 via [facility] stretcher transport, pickup planned for 12:30 PM - 1:00 PM.      Identified Possible Discharge Barriers: BP, blood transfusion      CM Interventions and Outcome: CM met with the patient's daughter [name] at bedside alongside bedside nurse and the Attending. Per Attending, will discharge the patient tomorrow. Daughter in agreement and wants to arrange her own transport with [facility]. Daughter arranged this for a 12:30 PM - 1:00 PM pickup tomorrow. Contact with [name] is Thermon Leyland [phone].     CM called and spoke with Cassi with Admissions at [facility]. Provided update of events yesterday and plan for discharge tomorrow. She states patient's dialysis slot is still confirmed and states they will have a bed for patient tomorrow. CM to call her in the morning to confirm room and report.       Discussed above Discharge Plan with (patient, family, Care Team, others): care team, daughter [name]      Case Management will continue to follow for [place] needs that may arise.      Marlan Palau, MSW, ACM-SW  Social Worker Case Manager II  [facility]

## 2022-08-17 NOTE — Nursing Progress Note (Addendum)
Patient seen and examined at bedside discussed with patient's daughter  Patient reports symptomatic dizziness because of her hemoglobin being low at 7.3 will hold discharge and give 1 unit of packed RBCs  This will also allow nephrologist on the outside to be able to dialyze more aggressively at the present time unable to do so with borderline low blood pressure readings hopefully with transfusion patient may go to skilled nursing facility and be able to go to and from dialysis minutes with  without untoward effects    Assessment  Chronic kidney disease on dialysis  Anemia due to CKD and acute blood loss  Relative hypotension  Bilateral nephrectomies      Plan:  Transfuse 1 unit of packed RBCs  Relative hypotension  Monitor in hospital overnight plan to discharge home 08/18/2022  Follow-up CBC in a.m.  Discussed with nursing staff, patient's daughter and case management

## 2022-08-17 NOTE — Plan of Care (Signed)
Pt's  AAO x2~3, intermittently, can follows command, complain of pain to abdomen, PRN tylenol given, can move extremities, weakness to LUE and BLE, SBP: 90's to 140's, NSR w/ HR:80's to 90's, palpable pulses, HD done today, stable VSS< midodrine and albumin given during HD to maintain BP. 1 unit of PRBC given as pt Hbg: 7.3, to maintain stable H&H before transport, on  RA, 2 BM this shift, tolerating pureed diet fine, need assist to feed, Anuric, skin: open @ sacrum, and wound surgical wound @ abdomen. Wound care performed as per the order.   Plan: Possible d/c to SNF tomorrow.     Purposeful rounding and care plan has been performed, discussed w/ daughter, and documented.     Problem: Moderate/High Fall Risk Score >5  Goal: Patient will remain free of falls  Outcome: Progressing  Flowsheets (Taken 08/17/2022 0800)  High (Greater than 13):   HIGH-Visual cue at entrance to patient's room   HIGH-Bed alarm on at all times while patient in bed   HIGH-Utilize chair pad alarm for patient while in the chair   HIGH-Apply yellow "Fall Risk" arm band   HIGH-Initiate use of floor mats as appropriate   HIGH-Consider use of low bed   HIGH-Pharmacy to initiate evaluation and intervention per protocol     Problem: Compromised Sensory Perception  Goal: Sensory Perception Interventions  Outcome: Progressing  Flowsheets (Taken 08/17/2022 0800)  Sensory Perception Interventions: Offload heels, Pad bony prominences, Reposition q 2hrs/turn Clock, Q2 hour skin assessment under devices if present     Problem: Compromised Moisture  Goal: Moisture level Interventions  Outcome: Progressing  Flowsheets (Taken 08/17/2022 0800)  Moisture level Interventions: Moisture wicking products, Moisture barrier cream     Problem: Compromised Activity/Mobility  Goal: Activity/Mobility Interventions  Outcome: Progressing  Flowsheets (Taken 08/17/2022 0800)  Activity/Mobility Interventions: Pad bony prominences, TAP Seated positioning system when OOB, Promote  PMP, Reposition q 2 hrs / turn clock, Offload heels     Problem: Compromised Nutrition  Goal: Nutrition Interventions  Outcome: Progressing  Flowsheets (Taken 08/17/2022 0800)  Nutrition Interventions: Discuss nutrition at Rounds, I&Os, Document % meal eaten, Daily weights     Problem: Compromised Friction/Shear  Goal: Friction and Shear Interventions  Outcome: Progressing  Flowsheets (Taken 08/17/2022 0800)  Friction and Shear Interventions: Pad bony prominences, Off load heels, HOB 30 degrees or less unless contraindicated, Consider: TAP seated positioning, Heel foams     Problem: Infection Prevention  Goal: Free from infection  Outcome: Progressing  Flowsheets (Taken 08/17/2022 0900)  Free from infection:   Monitor/assess vital signs   Assess incision for evidence of healing   Monitor/assess output from surgical drain if present   Assess for signs and symptoms of infection   Encourage/assist patient to turn, cough and perform deep breathing every 2 hours     Problem: Impaired Mobility  Goal: Mobility/Activity is maintained at optimal level for patient  Outcome: Progressing  Flowsheets (Taken 08/17/2022 0900)  Mobility/activity is maintained at optimal level for patient:   Encourage independent activity per ability   Consult/collaborate with Physical Therapy and/or Occupational Therapy     Problem: Constipation  Goal: Fluid and electrolyte balance are achieved/maintained  Outcome: Progressing  Flowsheets (Taken 08/17/2022 0900)  Fluid and electrolyte balance are achieved/maintained:   Observe for cardiac arrhythmias   Monitor for muscle weakness   Assess and reassess fluid and electrolyte status   Monitor/assess lab values and report abnormal values  Goal: Elimination patterns are normal or improving  Outcome: Progressing     Problem: Safety  Goal: Patient will be free from injury during hospitalization  Outcome: Progressing  Flowsheets (Taken 08/17/2022 0900)  Patient will be free from injury during hospitalization:    Assess patient's risk for falls and implement fall prevention plan of care per policy   Use appropriate transfer methods   Include patient/ family/ care giver in decisions related to safety   Assess for patients risk for elopement and implement Elopement Risk Plan per policy   Provide alternative method of communication if needed (communication boards, writing)   Hourly rounding   Ensure appropriate safety devices are available at the bedside   Provide and maintain safe environment  Goal: Patient will be free from infection during hospitalization  Outcome: Progressing  Flowsheets (Taken 08/17/2022 0900)  Free from Infection during hospitalization:   Assess and monitor for signs and symptoms of infection   Monitor all insertion sites (i.e. indwelling lines, tubes, urinary catheters, and drains)   Encourage patient and family to use good hand hygiene technique   Monitor lab/diagnostic results     Problem: Pain  Goal: Pain at adequate level as identified by patient  Outcome: Progressing  Flowsheets (Taken 08/17/2022 0900)  Pain at adequate level as identified by patient:   Identify patient comfort function goal   Assess for risk of opioid induced respiratory depression, including snoring/sleep apnea. Alert healthcare team of risk factors identified.   Assess pain on admission, during daily assessment and/or before any "as needed" intervention(s)   Reassess pain within 30-60 minutes of any procedure/intervention, per Pain Assessment, Intervention, Reassessment (AIR) Cycle   Evaluate if patient comfort function goal is met   Offer non-pharmacological pain management interventions   Consult/collaborate with Physical Therapy, Occupational Therapy, and/or Speech Therapy   Include patient/patient care companion in decisions related to pain management as needed   Consult/collaborate with Pain Service   Evaluate patient's satisfaction with pain management progress     Problem: Side Effects from Pain Analgesia  Goal: Patient will  experience minimal side effects of analgesic therapy  Outcome: Progressing  Flowsheets (Taken 08/17/2022 0900)  Patient will experience minimal side effects of analgesic therapy:   Monitor/assess patient's respiratory status (RR depth, effort, breath sounds)   Assess for changes in cognitive function   Prevent/manage side effects per LIP orders (i.e. nausea, vomiting, pruritus, constipation, urinary retention, etc.)   Evaluate for opioid-induced sedation with appropriate assessment tool (i.e. POSS)     Problem: Hemodynamic Status: Cardiac  Goal: Stable vital signs and fluid balance  Outcome: Progressing  Flowsheets (Taken 08/17/2022 0900)  Stable vital signs and fluid balance:   Assess signs and symptoms associated with cardiac rhythm changes   Monitor lab values     Problem: Renal Instability  Goal: Fluid and electrolyte balance are achieved/maintained  Outcome: Progressing  Flowsheets (Taken 08/17/2022 0900)  Fluid and electrolyte balance are achieved/maintained:   Monitor/assess lab values and report abnormal values   Observe for cardiac arrhythmias   Monitor for muscle weakness   Assess and reassess fluid and electrolyte status   Follow fluid restrictions/IV/PO parameters     Problem: Patient Receiving Advanced Renal Therapies  Goal: Therapy access site remains intact  Outcome: Progressing  Flowsheets (Taken 08/17/2022 0900)  Therapy access site remains intact:   Assess therapy access site   Change therapy access site dressing as needed     Problem: Fluid and Electrolyte Imbalance/ Endocrine  Goal: Fluid and electrolyte balance are achieved/maintained  Outcome:  Progressing  Flowsheets (Taken 08/17/2022 0900)  Fluid and electrolyte balance are achieved/maintained:   Observe for cardiac arrhythmias   Monitor for muscle weakness   Assess and reassess fluid and electrolyte status   Monitor/assess lab values and report abnormal values  Goal: Adequate hydration  Outcome: Progressing  Flowsheets (Taken 08/17/2022  0900)  Adequate hydration:   Monitor and assess vital signs and perfusion   Assess mucus membranes, skin color, turgor, perfusion and presence of edema   Assess for peripheral, sacral, periorbital and abdominal edema     Problem: Diabetes: Glucose Imbalance  Goal: Blood glucose stable at established goal  Outcome: Progressing  Flowsheets (Taken 08/17/2022 0900)  Blood glucose stable at established goal:   Monitor lab values   Monitor intake and output.  Notify LIP if urine output is < 30 mL/hour.   Follow fluid restrictions/IV/PO parameters   Assess for hypoglycemia /hyperglycemia   Ensure appropriate consults are obtained (Nutrition, Diabetes Education, and Case Management)   Ensure patient/family has adequate teaching materials   Ensure adequate hydration   Coordinate medication administration with meals, as indicated   Ensure appropriate diet and assess tolerance   Monitor/assess vital signs   Include patient/family in decisions related to nutrition/dietary selections     Problem: Inadequate Gas Exchange  Goal: Adequate oxygenation and improved ventilation  Outcome: Progressing  Flowsheets (Taken 08/17/2022 0900)  Adequate oxygenation and improved ventilation:   Assess lung sounds   Monitor SpO2 and treat as needed   Provide mechanical and oxygen support to facilitate gas exchange   Teach/reinforce use of incentive spirometer 10 times per hour while awake, cough and deep breath as needed   Plan activities to conserve energy: plan rest periods   Monitor and treat ETCO2   Position for maximum ventilatory efficiency   Increase activity as tolerated/progressive mobility   Consult/collaborate with Respiratory Therapy     Problem: Neurological Deficit  Goal: Neurological status is stable or improving  Outcome: Progressing  Flowsheets (Taken 08/17/2022 0900)  Neurological status is stable or improving:   Monitor/assess/document neurological assessment (Stroke: every 4 hours)   Monitor/assess NIH Stroke Scale   Observe for  seizure activity and initiate seizure precautions if indicated   Perform CAM Assessment     Problem: Potential for Aspiration  Goal: Risk of aspiration will be minimized  Outcome: Progressing  Flowsheets (Taken 08/17/2022 0900)  Risk of aspiration will be minimized:   Assess/monitor ability to swallow using dysphagia screen: Keep patient NPO if patient fails screening   Place swallow precaution signage above bed and supervise patient during oral intake   Monitor/assess for signs of aspiration (tachypnea, cough, wheezing, clearing throat, hoarseness after eating, decrease in SaO2)   Consult/collaborate/follow recommended modified texture diet/thicken liquids as indicated by Speech Pathologist   Place patient up in chair to eat, if possible/head of bed up 90 degrees to eat if unable to be out of bed   Instruct patient to take small bites, small single sips of liquid, and do not use a straw     Problem: Every Day - Stroke  Goal: Mobility/Activity is maintained at optimal level for patient  Outcome: Progressing  Flowsheets (Taken 08/17/2022 0900)  Mobility/activity is maintained at optimal level for patient:   Encourage independent activity per ability   Consult/collaborate with Physical Therapy and/or Occupational Therapy  Goal: Elimination patterns are normal or improving  Outcome: Progressing  Goal: Neurological status is stable or improving  Outcome: Progressing  Flowsheets (Taken 08/17/2022 0900)  Neurological status is stable or improving:   Monitor/assess/document neurological assessment (Stroke: every 4 hours)   Monitor/assess NIH Stroke Scale   Observe for seizure activity and initiate seizure precautions if indicated   Perform CAM Assessment  Goal: Core/Quality measure requirements - Daily  Outcome: Progressing  Flowsheets (Taken 08/17/2022 0900)  Core/Quality measure requirements - Daily:   VTE Prevention: Ensure anticoagulant(s) administered and/or anti-embolism stockings/devices documented by end of day 2    Ensure antithrombotic administered or contraindication documented by LIP by end of day 2   Once lipid panel has resulted, check LDL. Contact provider for statin order if LDL > 70 (or ensure contraindication documented by LIP).   Continue stroke education (must include Modifiable Risk Factors, Warning Signs and Symptoms of Stroke, Activation of Emergency Medical System and Follow-up Appointments). Ensure handout has been given and documented.  Goal: Stable vital signs and fluid balance  Outcome: Progressing  Flowsheets (Taken 08/17/2022 0900)  Stable vital signs and fluid balance:   Position patient for maximum circulation/cardiac output   Monitor and assess vitals every 4 hours or as ordered and hemodynamic parameters   Monitor intake and output. Notify LIP if urine output is < 30 mL/hour.   Encourage oral fluid intake   Apply telemetry monitor as ordered  Goal: Patient will maintain adequate oxygenation  Outcome: Progressing  Flowsheets (Taken 08/17/2022 0900)  Patient will maintain adequate oxygenation:   Suction secretions as needed   Maintain SpO2 of greater than 92%   Sleep Apnea: Encourage patient to speak to PCP regarding sleep apnea/sleep study ([place] only)   Sleep Apnea: Assess if previously tested or on CPAP  Goal: Patient's risk of aspiration will be minimized  Outcome: Progressing  Flowsheets (Taken 08/17/2022 0900)  Patient's risk of aspiration will be minimized:   Complete new dysphagia screen for any change in status: Keep patient NPO if patient fails   Place swallow precaution signage above bed   Monitor/assess for signs of aspiration (tachypnea, cough, wheezing, clearing throat, hoarseness after eating, decrease in SaO2   Order modified texture diet as recommend by Speech Pathologist   Assess and monitor ability to swallow   Place patient up in chair to eat, if possible   Instruct patient to take small bites   Consult/Collaborate with Speech Pathologist for dysphagia   Instruct patient to take small  single sips of liquid   HOB up 90 degrees to eat if unable to be OOB   Keep head of bed up a minimum of 30 degrees when hemodynamically stable   Thicken liquids as recommended by Speech Pathologist   Do not use a straw   Supervise patient during oral intake  Goal: Nutritional intake is adequate  Outcome: Progressing  Flowsheets (Taken 08/17/2022 0900)  Nutritional intake is adequate: Consult/collaborate with Clinical Nutritionist  Goal: Skin integrity is maintained or improved  Outcome: Progressing  Flowsheets (Taken 08/17/2022 0900)  Skin integrity is maintained or improved:   Assess Braden Scale every shift   Increase activity as tolerated/progressive mobility   Keep skin clean and dry   Collaborate with Wound, Ostomy, and Continence Nurse   Keep head of bed 30 degrees or less (unless contraindicated)   Encourage use of lotion/moisturizer on skin   Relieve pressure to bony prominences   Turn or reposition patient every 2 hours or as needed unless able to reposition self   Avoid shearing   Monitor patient's hygiene practices   Utilize specialty bed  Goal: Neurovascular status is stable  or improving  Outcome: Progressing  Flowsheets (Taken 08/17/2022 0900)  Neurovascular status is stable or improving:   Monitor/assess neurovascular status (pulses, capillary refill, pain, paresthesia, presence of edema)   Monitor/assess for signs of Venous Thrombus Emboli (edema of calf/thigh redness, pain)   Monitor/assess site of invasive procedure for signs of bleeding  Goal: Effective coping demonstrated  Outcome: Progressing  Flowsheets (Taken 08/17/2022 0900)  Effective coping demonstrated: Assess/report to LIP uncontrolled anxiety, depression, or ineffective coping  Goal: Will be able to express needs and understand communication  Outcome: Progressing  Flowsheets (Taken 08/17/2022 0900)  Able to express needs and understand communication:   Consult/collaborate with Case Management/Social Work   Provide alternative method of  communication if needed   Consult/collaborate with Speech Language Pathology (SLP)   Patient/patient care companion demonstrates understanding on disease process, treatment plan, medications and discharge plan   Include patient care companion in decisions related to communication     Problem: Pain interferes with ability to perform ADL  Goal: Pain at adequate level as identified by patient  Outcome: Progressing  Flowsheets (Taken 08/17/2022 0900)  Pain at adequate level as identified by patient:   Identify patient comfort function goal   Assess for risk of opioid induced respiratory depression, including snoring/sleep apnea. Alert healthcare team of risk factors identified.   Assess pain on admission, during daily assessment and/or before any "as needed" intervention(s)   Reassess pain within 30-60 minutes of any procedure/intervention, per Pain Assessment, Intervention, Reassessment (AIR) Cycle   Evaluate if patient comfort function goal is met   Offer non-pharmacological pain management interventions   Consult/collaborate with Physical Therapy, Occupational Therapy, and/or Speech Therapy   Include patient/patient care companion in decisions related to pain management as needed   Consult/collaborate with Pain Service   Evaluate patient's satisfaction with pain management progress     Problem: Everyday - Heart Failure  Goal: Mobility/Activity is Maintained at Optimal Level for Patient  Outcome: Progressing  Flowsheets (Taken 08/17/2022 0900)  Mobility/Activity is Maintained at Optimal Level for Patient:   Increase mobility as tolerated/progressive mobility protocol   Reposition patient every 2 hours and as needed unless able to reposition self   Assess for changes in respiratory status, level of consciousness and/or development of fatigue   Consult with Physical Therapy and/or Occupational Therapy   Maintain SCD's as Ordered   Perform active/passive ROM  Goal: Nutritional Intake is Adequate  Outcome:  Progressing  Flowsheets (Taken 08/17/2022 0900)  Nutritional Intake is Adequate:   Cardiac diet-2 gm Sodium   Patient and family teaching on low sodium diet   Encourage/perform oral hygiene as appropriate   Fluid Restricction if needed   Assess appetite,anorexia and amount of meal/food tolerated   Consult/Collaborate with Nutritionist

## 2022-08-17 NOTE — Progress Notes (Signed)
Pt daughter called, mentioned they want pt to be discharged on Wednesday to the skilled facility and she wants to pay out of pocket to transport pt to skilled facility.

## 2022-08-17 NOTE — Progress Notes (Signed)
3.5hrs of HD tx completed w/ a net fluid removal of . Hypotensive during tx required albumin IVPB and 10mg  midodrine PO w/ good effect. R-CVC access w/ problems alarming/positional. Unable to run more than 237mL/min BFR. Dressing C/D/I secured w/ new tegos and curos caps. Report given to primary RN Tamanna. Patient Vitals for the past 4 hrs:   BP Temp Pulse Resp   08/17/22 1350 (P) 112/46 -- (P) 80 (P) 21   08/17/22 1342 109/53 97.5 F (36.4 C) 80 20   08/17/22 1330 109/53 -- 80 (!) 24   08/17/22 1315 110/56 -- 81 (!) 26   08/17/22 1300 96/50 -- 80 (!) 23   08/17/22 1245 107/55 -- 79 20   08/17/22 1230 115/59 -- 85 20   08/17/22 1215 122/58 -- 78 22   08/17/22 1200 101/54 98.4 F (36.9 C) 82 22   08/17/22 1145 103/51 -- 79 20   08/17/22 1130 99/53 -- 81 21   08/17/22 1115 103/51 -- 86 19   08/17/22 1045 99/52 -- 86 (!) 23   08/17/22 1030 108/58 -- 92 22   08/17/22 1015 103/57 -- 90 21   08/17/22 1000 96/50 -- 83 20        08/17/22 1350   Treatment Summary   Time Off Machine 1342   Duration of Treatment (Hours) 3.5   Treatment Type 1:1   Dialyzer Clearance Lightly streaked   Fluid Volume Off (mL) 915   Prime Volume (mL) 200   Rinseback Volume (mL) 200   Fluid Given: Normal Saline (mL) 0   Fluid Given: PRBC  0 mL   Fluid Given: Albumin (mL) 100   Fluid Given: Other (mL) 0   Total Fluid Given 500   Hemodialysis Net Fluid Removed 415   Post Treatment Assessment   Post-Treatment Weight (Kg) -0.42   Patient Response to Treatment fair   Information for Next Treatment TBD   Additional Dialyzer Used 0   Permacath Catheter - Tunneled 07/14/22 Subclavian vein catheter Right   Placement Date/Time: 07/14/22 1404   Inserted by: Dr. Welton Flakes  Access Type: Subclavian vein catheter  Orientation: Right  Central Line Infection Prevention Education provided?: Yes  Hand Hygiene: Alcohol based hand scrub;Soap and water  Line cart u...   Line necessity reviewed? Apheresis/hemodialysis   Site Assessment Clean;Dry;Intact    Catheter Lumen Volume Venous 1.6 mL   Catheter Lumen Volume Arterial 1.6 mL   Dressing Status and Intervention Dressing Intact   Tego/Curos Caps on Catheter Yes   NEW Tego/Curos Caps placed (Date) 08/17/22   APHERESIS ONLY:  Access  Red   APHERESIS ONLY: Return Blue   End Caps Free From Blood Yes   Line Care Connections checked and tightened   Dressing Type Biopatch;Transparent;Antimicrobial impregnated   Dressing Status Clean;Dry;Intact   Dressing/Line Intervention Caps changed   Line Used For Blood Draw Other (Comment)  (HD)   Dressing Change Due 08/23/22   Vitals   Heart Rate 80   Resp Rate 21   BP 112/46   SpO2 95 %   O2 Device None (Room air)   Assessment   Mental Status Alert;Oriented;Restless   Cardiac (WDL) WDL   Cardiac Regularity Return to [facility]   Cardiac Symptoms Return to [facility]   Cardiac Rhythm Normal sinus rhythm   Respiratory  X   Respiratory Pattern Dyspnea with exertion;Shallow   Bilateral Breath Sounds Diminished   R Breath Sounds Return to WDL   L Breath Sounds Return  to WDL   Cough No cough effort   Edema  WDL   General Skin Color Appropriate for ethnicity   Skin Temp Warm;Dry   Gastrointestinal (WDL) WDL   Abdomen Inspection Return to WDL   GI Conditions Return to [facility]   Mobility Bed   Pain Assessment   Charting Type Assessment   Pain Scale Used Numeric Scale (0-10)   Numeric Pain Scale   Pain Score 0   POSS Score 1   Education   Person taught Patient   Knowledge basis Minimal   Topics taught Procedure   Teaching Tools Explain   Reponse Verbalizes Understanding   Bedside Nurse Communication   Name of bedside RN - pre dialysis Luz Brazen, RN   Name of bedside RN - post dialysis Luz Brazen, RN

## 2022-08-17 NOTE — Progress Notes (Signed)
Patient seen and examined at bedside discussed with patient's daughter  Patient reports symptomatic dizziness because of her hemoglobin being low at 7.3 will hold discharge and give 1 unit of packed RBCs  This will also allow nephrologist on the outside to be able to dialyze more aggressively at the present time unable to do so with borderline low blood pressure readings hopefully with transfusion patient may go to skilled nursing facility and be able to go to and from dialysis minutes with  without untoward effects    Assessment  Chronic kidney disease on dialysis  Anemia due to CKD and acute blood loss  Relative hypotension  Bilateral nephrectomies      Plan:  Transfuse 1 unit of packed RBCs  Relative hypotension  Monitor in hospital overnight plan to discharge home 08/18/2022  Follow-up CBC in a.m.  Discussed with nursing staff, patient's daughter and case management

## 2022-08-17 NOTE — Progress Notes (Signed)
NEURO:  A/O X 1-2  Following commands intermittently   Q4hr Neuro checks;   NIH=17  Response to voice  PERRLA:    CV:  Pulses palpable;  HR:NSR; on monitor.   SBP: Goal Map > 65   Generalized nonpitting edema.    SCDs intact    Drips:     Respiratory:   Room air    GI:  Diets:   Blood sugars Q 6    GU:  UO=Anuric    SKIN:   Skin/ incontinence care rendered. Moisture barrier cream applied.   Mepilex/Adhesive Foam Dressing applied to sacrum/BL heels  Repositioned at intervals. Kept clean, dry and comfortable.     Encouraged to call for assistance as needed. Safety maintained. Bed placed in the lowest position with call bell in reach, bed alarm on, and hourly rounding completed.    Significant event:

## 2022-08-17 NOTE — Discharge Summary (Signed)
[facility]      Patient: Shelby Barron  Admission Date: 06/28/2022   DOB: 1940-04-18  Discharge Date: 08/17/2022    MRN: [id number]  Discharge Attending:Gerren Hoffmeier Aquilla Solian, MD     Referring Physician: Patsy Lager, MD  PCP: Patsy Lager, MD       DISCHARGE SUMMARY     Discharge Information   Admission Diagnosis:   Bladder cancer    Discharge Diagnosis:   Hypotension  Encephalopathy  Acute toxic metabolic encephalopathy/delirium  Possible underlying cognitive impairment  Anemia of CKD  Acute blood loss anemia  Status post open cystostomy, bilateral nephrectomies, resection of small bowel after IntraOp enterotomy  Mixed shock, hemorrhagic versus septic  Necrotic wound tissue  Acute CVA  Petechial hemorrhage versus cortical laminar necrosis  Acute hypoxic respiratory failure  COPD  ESRD  A-fib with RVR  Renal cell carcinoma  Urothelial carcinoma  Overweight  Hypercoagulable state  Severe malnutrition    Malnutrition Documentation    Severe malnutrition related to inadequate oral intake in the setting of acute illness as evidenced by PO intake meeting <50% of needs for >5 days, moderate-severe muscle loss (temporalis, deltoid, interosseous, quadriceps, gastrocnemius, trapezius, supraspinatus, infraspinatus), moderate fat loss (orbital, buccal, triceps), and 13% weight loss in >1 month         Admission Condition: Acute distress  Discharge Condition: Stable yet guarded  Consultants: Palliative medicine, infectious disease, GI, cardiology, general surgery, nephrology, critical care, CV IR  Functional Status: Stable  Discharged to: [facility]       Hospital Course   Presentation History   Per admission H&P:    Hospital Course (50 Days)     Patient is an 82 year old female with multiple medical problems including COPD, prior stroke in 2004, type 2 diabetes, RCC status post bilateral nephrectomy, [name], recently diagnosed urothelial cancer, prolonged hospitalization that started on June 28, 2022.  At that moment patient  was admitted due to an elective cystectomy and completion nephrectomy with lower course complicated by small bowel enterotomy intraoperative hemorrhage 1 L EBL status post treatment for septic and hemorrhagic shock in the ICU.  Required CRRT at the time.  Infectious diseases been following since that time.  Was taken back to the OR on 12 June for small bowel anastomosis, abdominal closure and wound VAC.  Extubated June 13, downgraded to floor June 16.  Had right-sided permacath placed by IR on 26 June.  Upgraded back to ICU on 30 June due to lethargy.  Repeat MRI of the brain was conducted which was negative along with EEG.  Was downgraded once more and has been under hospitalist medicine since.  Had new onset A-fib RVR status post amiodarone infusion and Eliquis started 23 July 2022.  Was awaiting discharge.  Family paying out-of-pocket for SNF as she did not seem to be rehab-able. Downgraded from ICU again on 24 July.  Has soft pressures recorded limited dialysis and limited amount of fluids pulled.  Has required albumin and volume provided back during dialysis.  Nephrology started midodrine initially just before dialysis however to change to twice a day to maintain blood pressures.  Started on Megace for appetite improvement.  General surgery curb sided to ask if the patient would be a candidate for PEG tube, due to the malnutrition the patient already is going through, unable to do PEG tube by surgical means.  Could be done in the future by GI if she will tolerate EGD.  Likely unable to tolerate sedation General  otherwise due to very strict blood pressure requirements.  Patient's daughter, Swetha Magnone has been kept in touch every day about the patient's progress.  Wanted wound nurse to talk to her on day of discharge.  Wound nurse spoke with her on day of discharge along with MD who spoke with her daily as well.  Patient is okay for discharge from medical standpoint to skilled nursing facility, wound VAC will  be continued at the skilled nursing facility.  Patient is okay to discharge to SNF           Progress Note/Physical Exam at Discharge     Subjective:Patient is stable for discharge.    Vitals:    08/17/22 1713 08/17/22 1715 08/17/22 1725 08/17/22 1730   BP: 136/63 134/61  136/63   Pulse: 91 91 91 91   Resp: (!) 23 (!) 26 (!) 23 (!) 23   Temp: 98.5 F (36.9 C) 98.5 F (36.9 C)     TempSrc:  Axillary     SpO2:  95% 95% 96%   Weight:       Height:               General: NAD, AAOx2  HEENT: Sclera anicteric  Neck: Supple  Cardiovascular: RRR  Lungs: Decreased breath sounds at bases, no crackles or rhonchi noted  Abdomen: Soft, +BS  Extremities: No significant pitting edema  Neuro: No Focal neurological deficits       Diagnostics     Labs/Studies Pending at Discharge: No    Last Labs   Recent Labs   Lab 08/16/22  0351 08/15/22  0309 08/14/22  0531   WBC 14.97* 14.98* 14.58*   RBC 2.44* 2.74* 2.95*   Hemoglobin 7.3* 8.1* 8.7*   Hematocrit 23.0* 25.5* 27.3*   MCV 94.3 93.1 92.5   Platelet Count 367* 363* 363*       Recent Labs   Lab 08/17/22  0441 08/16/22  0351 08/15/22  0309 08/14/22  0531 08/13/22  0322   Sodium 140 140 142 144 142   Potassium 4.9 4.7 3.9 3.8 3.7   Chloride 101 101 103 102 103   CO2 23 24 26 25 28    BUN 39* 26* 16 29* 15   Creatinine 4.8* 3.6* 2.7* 4.0* 2.9*   Glucose 88 119* 103* 113* 96   Calcium 8.9 8.9 8.8 8.9 8.2   Magnesium 1.9 1.9 1.8 1.8 1.7       Microbiology Results (last 15 days)       Procedure Component Value Units Date/Time    Culture, Blood, Aerobic And Anaerobic [[id number]] Collected: 08/11/22 0208    Order Status: Completed Specimen: Blood, Venous Updated: 08/16/22 0700     Culture Blood No growth at 5 days    Culture, Blood, Aerobic And Anaerobic [[id number]] Collected: 08/11/22 0208    Order Status: Completed Specimen: Blood, Venous Updated: 08/16/22 0700     Culture Blood No growth at 5 days             Patient Instructions   Discharge Diet: Full liquids with evaluation by SLP at the  skilled nursing facility and advancement as tolerated  Discharge Activity: Per SNF    Follow Up Appointment:   Follow-up Information       [facility] [place]. Call.    Specialty: Gastroenterology  Why: please call to make f/u in 6 weeks.  Contact information:  [address]  [place] [address]  [phone]  Additional  information:  Take [address] to [place] [place].  Turn right on [address] and then right again at [employer].  Destination will be on your right.               Alla German, MD Follow up.    Specialties: Nephrology, Internal Medicine  Contact information:  [address]  [place] [address]  [phone]               [facility] Follow up in 1 month(s).    Specialty: Cardiology  Contact information:  [address]  [place] [address]  [phone]             Marlaine Hind, MD Follow up in 1 week(s).    Specialty: Surgery  Contact information:  [address] [employer]  [address]  [place] [address]  [phone]               [facility] Follow up in 1 week(s).    Contact information:  [address]  [address]  [address] [address]             Pcp, Kathreen Cosier, MD .                              Discharge Medications:     Medication List        START taking these medications      amiodarone 200 MG tablet  Commonly known as: PACERONE  Take 1 tablet (200 mg) by mouth daily This is a maintenance dose.     apixaban 2.5 MG  Commonly known as: ELIQUIS  Take 1 tablet (2.5 mg) by mouth every 12 (twelve) hours     b complex-vitamin c-folic acid 0.8 MG Tabs  Take 1 tablet by mouth daily     carvedilol 12.5 MG tablet  Commonly known as: COREG  Take 1 tablet (12.5 mg) by mouth every 12 (twelve) hours     * megestrol 40 MG tablet  Commonly known as: MEGACE  Take 1 tablet (40 mg) by mouth 4 (four) times daily     * megestrol acetate 400 MG/10ML  Susp  Commonly known as: MEGACE  Take 10 mLs (400 mg) by mouth daily     * midodrine 10 MG tablet  Commonly known as: PROAMATINE  Take 1 tablet (10 mg) by mouth Once daily every Tuesday, Thursday and Saturday morning     * midodrine 5 MG tablet  Commonly known as: PROAMATINE  Take 1 tablet (5 mg) by mouth 2 (two) times daily with meals     pantoprazole 40 mg/20 mL Susp  Commonly known as: PROTONIX  Take 20 mLs (40 mg) by mouth every 12 (twelve) hours  Replaces: pantoprazole 40 MG tablet     * polyethylene glycol 17 g packet  Commonly known as: MIRALAX  Take 17 g by mouth daily as needed (constipation)     * polyethylene glycol 17 g packet  Commonly known as: MIRALAX  Take 17 g by mouth daily     traMADol 50 MG tablet  Commonly known as: ULTRAM  Take 1 tablet (50 mg) by mouth every 8 (eight) hours as needed for Pain           * This list has 6 medication(s) that are the same as other medications prescribed for  you. Read the directions carefully, and ask your doctor or other care provider to review them with you.                CONTINUE taking these medications      Anoro Ellipta 62.5-25 MCG/ACT Aepb  Generic drug: umeclidinium-vilanterol     atorvastatin 40 MG tablet  Commonly known as: LIPITOR     cinacalcet 30 MG tablet  Commonly known as: SENSIPAR     conjugated estrogens vaginal cream  Commonly known as: PREMARIN     montelukast 10 MG tablet  Commonly known as: SINGULAIR     ondansetron 4 MG tablet  Commonly known as: Zofran  Take 1 tablet (4 mg) by mouth every 8 (eight) hours as needed for Nausea     oxybutynin XL 5 MG 24 hr tablet  Commonly known as: DITROPAN-XL     sevelamer carbonate 800 MG tablet  Commonly known as: RENVELA            STOP taking these medications      allopurinol 100 MG tablet  Commonly known as: ZYLOPRIM     AMBIEN PO     b complex vitamins capsule     clopidogrel 75 mg tablet  Commonly known as: PLAVIX     hyoscyamine 0.125 MG tablet  Commonly known as: LEVSIN     insulin aspart 100  UNIT/ML injection  Commonly known as: NovoLOG     metoprolol tartrate 25 MG tablet  Commonly known as: LOPRESSOR     oxyCODONE 5 MG capsule  Commonly known as: OXY-IR     pantoprazole 40 MG tablet  Commonly known as: PROTONIX  Replaced by: pantoprazole 40 mg/20 mL Susp     pregabalin 50 MG capsule  Commonly known as: LYRICA     torsemide 100 MG tablet  Commonly known as: DEMADEX     Tresiba 100 UNIT/ML Soln  Generic drug: Insulin Degludec            ASK your doctor about these medications      oxyCODONE 5 MG immediate release tablet  Commonly known as: ROXICODONE  Take 0.5 tablets (2.5 mg) by mouth every 8 (eight) hours as needed for Pain  Ask about: Should I take this medication?               Where to Get Your Medications        These medications were sent to [id number] - [place], [place] - [address]  [address], [place] [address]      Phone: [phone]   megestrol 40 MG tablet  megestrol acetate 400 MG/10ML Susp  midodrine 10 MG tablet  midodrine 5 MG tablet  polyethylene glycol 17 g packet  traMADol 50 MG tablet       You can get these medications from any pharmacy    Bring a paper prescription for each of these medications  oxyCODONE 5 MG immediate release tablet       Information about where to get these medications is not yet available    Ask your nurse or doctor about these medications  amiodarone 200 MG tablet  apixaban 2.5 MG  b complex-vitamin c-folic acid 0.8 MG Tabs  carvedilol 12.5 MG tablet  pantoprazole 40 mg/20 mL Susp  polyethylene glycol 17 g packet          Time spent examining patient, discussing with patient/family regarding hospital course, chart review, reconciling medications  and discharge planning: 60 minutes.    Signed,  Gwyndolyn Kaufman, MD    6:13 PM 08/17/2022

## 2022-08-17 NOTE — Progress Notes (Addendum)
[facility]  Nutrition Follow up    Shelby Barron 82 y.o. female   MRN: [id number]    Summary of Nutrition Recommendations:    Palliative care to see patient today - monitor GOC  Continue current diet per SLP reccs  1:1 assistance during meal times  Magic Cup BID  Each Magic Cup provides an additional 290 kcal and 9 gm protein.  Ensure plus HP TID   - ONS provides 350 kcal, 20 g protein per serving   Daily weights  Continue appetite stimulant (megace)    -----------------------------------------------------------------------------------------------------------------                                                          Assessment Data:     Nutrition: pt seen at bedside. Receiving dialysis. Breakfast tray on table - 0% consumed. Megace started on 7/26. Will continue to send ONS. Writer encouraged if pt was not going to consume her meal to at least try and drink ONS. Denies N/V/C/D. Will continue to monitor appetite stimulant effectiveness.     Learning Needs: encouraged po intake     Hospital Admission: 82 yo female with PHMx COPD, CVA, DM, renal cell carcinoma s/p partial b/l nephrectomy, [name] (05/2022), new diagnosis of urothelial CA, presenting for scheduled cystectomy and complete nephrectomy. OR course c/b small bowel enterotomy and significant bleeding.   6/11: CRRT initiated  6/12: OR for small bowel anastomosis, closure of mesenteric defect, closure of abdominal wall with wound VAC application  6/13: Concern for prolonged NPO status, TPN initiated, Extubation  6/15: ROBF, TPN completed with plan for transition to PO/EN pending swallow. Transition from CRRT to HD   6/16: SLP eval, rec NPO.  6/17: Feeding tube placed and tube feeding initiated  7/2: CLD initiated   7/8: Diet advanced to Pureed   7/9: NGT removed, TF d/c  7/15: Plan for wound debridement with short course Abx    Medical Hx:  has a past medical history of Acid reflux, Asthma, well controlled, Cancer of kidney, Chronic  lower back pain, Congestive heart disease, COPD (chronic obstructive pulmonary disease), CVA (cerebral infarction) (01/12/2003), Diabetes, Diabetic nephropathy, Fibroids, Gout, H/O: gout, Hyperlipidemia, Hypertension, Neuropathy of hand, and SOB (shortness of breath).    PSH: has a past surgical history that includes Hernia repair; APPENDECTOMY (OPEN); Hysterectomy; tumor removed from kidney Marletta Lor 2015); Partial nephrectomy; TURBT; Drain (Other) (N/A, 05/31/2022); Tunneled Cath Placement (Permcath) (N/A, 06/01/2022); CYSTECTOMY, ILEOCONDUIT (N/A, 06/28/2022); LAPAROTOMY, NEPHRECTOMY (Bilateral, 06/28/2022); EXPLORATORY LAPAROTOMY, RESECTION SMALL BOWEL (N/A, 06/28/2022); WOUND VAC APPLICATION (N/A, 06/28/2022); CLOSURE, ENTEROTOMY, SMALL INTESTINE (06/28/2022); EXPLORATORY LAPAROTOMY (N/A, 06/30/2022); WOUND VAC APPLICATION (N/A, 06/30/2022); Tunneled Cath Removal (Permcath) (Right, 07/14/2022); and Tunneled Cath Placement (Permcath) (Right, 07/14/2022).     Orders Placed This Encounter   Procedures    Adult diet Therapeutic/ Modified; Solid; Pureed (IDDSI level 4) (two servings of soup with lunch and dinner / strained oatmeal with breakfast); Mildly thick (IDDSI level 2); Room service: No    Magic Cup Quantity: A. One; Frequency: BID (2 times a day) - with Lunch and Dinner    Ensure Plus High Protein Supplement Quantity: A. One; Flavor: Vanilla; Frequency: TID (3 times a day) with meals     Intake:      08/13/22 1000 08/13/22 1400 08/13/22 1600  Intake (mL)   Percent Meal Consumed (%)  --   --   --    Data Collected by  --   --   --    Supplement Source Ensure High Protein Ensure Plus Hi Protein - Vanilla Ensure Plus Hi Protein - Vanilla   Percent Supplement Consumed (%) 25% 100% 100%      08/14/22 0800 08/14/22 1400 08/14/22 1800   Intake (mL)   Percent Meal Consumed (%) 25% 0% 0%   Data Collected by  --   --   --    Supplement Source  --   --   --    Percent Supplement Consumed (%) 100% 100% 100%      08/15/22 0800  08/15/22 1000 08/15/22 1300   Intake (mL)   Percent Meal Consumed (%) 25%  --  100%   Data Collected by   (RN)  --   --    Supplement Source  --  Ensure Plus Hi Protein - Vanilla  --    Percent Supplement Consumed (%)  --  100%  --       08/15/22 1600 08/16/22 1000   Intake (mL)   Percent Meal Consumed (%) 100%  --    Data Collected by  --   --    Supplement Source  --   --    Percent Supplement Consumed (%)  --  0%       ANTHROPOMETRIC  Height: 157.5 cm (5\' 2" )  Weight: 81.2 kg (179 lb 0.2 oz)     Weight Change: -4.92              Body mass index is 32.74 kg/m.    Weight Monitoring     Weight Weight Method   05/26/2022 88.451 kg  Stated     88.5 kg  Standing Scale    06/23/2022 84.823 kg     06/25/2022 87.091 kg     06/28/2022 87.09 kg  Standing Scale    06/29/2022 91.3 kg  Bed Scale    06/30/2022 96 kg  Bed Scale    07/01/2022 96.7 kg  Bed Scale    07/02/2022 97.6 kg  Bed Scale    07/03/2022 93 kg  Bed Scale    07/04/2022 87.8 kg  Bed Scale    07/05/2022 87.8 kg  Bed Scale    07/06/2022 93.6 kg  Bed Scale    07/07/2022 90.4 kg  Bed Scale     90.4 kg     07/08/2022 93.2 kg  Bed Scale    07/09/2022 93.1 kg  Bed Scale    07/10/2022 93.2 kg  Bed Scale     88.3 kg  Bed Scale    07/11/2022 88.5 kg  Bed Scale    07/12/2022 87.8 kg  Bed Scale    07/13/2022 87.5 kg  Bed Scale    07/14/2022 84 kg     07/15/2022 88.3 kg  Bed Scale    07/16/2022 86.5 kg  Bed Scale    07/17/2022 96.9 kg  Bed Scale    07/18/2022 101 kg  Bed Scale     87.5 kg     07/19/2022 86.7 kg  Bed Scale    07/20/2022 90.4 kg  Bed Scale    07/21/2022 90.4 kg  Bed Scale    07/22/2022 90.4 kg  Bed Scale    07/23/2022 90.4 kg  Bed Scale    07/24/2022 87.5 kg  Bed Scale    07/25/2022  86.9 kg  Bed Scale    07/26/2022 86.9 kg  Bed Scale    07/27/2022 95.2 kg  Bed Scale    07/28/2022 90.5 kg  Bed Scale    07/29/2022 97.387 kg  Bed Scale    07/30/2022 89.6 kg  Bed Scale    07/31/2022 87.317 kg     08/01/2022 85.6 kg     08/02/2022 89.3 kg     08/03/2022 90.1 kg  Bed Scale     90.102 kg     08/04/2022 89.4 kg  Bed  Scale    08/05/2022 82.5 kg  Bed Scale    08/06/2022 83.099 kg  Bed Scale    08/07/2022 85.7 kg  Bed Scale    08/08/2022 86 kg  Bed Scale    08/09/2022 84.6 kg     08/10/2022 85.9 kg     08/11/2022 79.7 kg  Bed Scale    08/12/2022 83.4 kg  Bed Scale    08/13/2022 83.4 kg     08/14/2022 81.6 kg  Bed Scale    08/15/2022 82.7 kg  Bed Scale    08/16/2022 85.4 kg     08/17/2022 81.2 kg  Bed Scale          Weight History Summary:       ESTIMATED NEEDS    Total Daily Energy Needs: 2125 to 2550 kcal  Method for Calculating Energy Needs: 25 kcal - 30 kcal per kg  at 85 kg (Actual body weight)  Rationale: non icu, HD, wound       Total Daily Protein Needs: 99.792 to 124.74 g  Method for Calculating Protein Needs: 2 g - 2.5 g per kg at 49.896 kg (Ideal body weight)  Rationale: non icu, HD, wound      Total Daily Fluid Needs: 1247.4 to 1496.88 ml  Method for Calculating Fluid Needs: 25 ml - 30 ml  per kg at 49.896 kg (Ideal body weight)  Rationale: BMI, wound; or per team      Pertinent Medications:  Current Facility-Administered Medications   Medication Dose Route Frequency    acetaminophen  650 mg Oral 4 times per day    Or    acetaminophen  650 mg Oral 4 times per day    amiodarone  200 mg per NG tube Daily    apixaban  2.5 mg Oral Q12H [facility]    atorvastatin  40 mg Oral QHS    b complex-vitamin c-folic acid  1 tablet Oral Daily    darbepoetin alfa  0.45 mcg/kg Subcutaneous Weekly    lidocaine  20 mL Intradermal Once    megestrol  400 mg Oral Daily    midodrine  10 mg Oral Q Tue/Thu/Sat (AM)    midodrine  5 mg Oral BID Meals    pantoprazole  40 mg per NG tube Q12H [facility]    polyethylene glycol  17 g Oral Daily    senna-docusate  1 tablet Oral QHS    thiamine  100 mg per NG tube Daily    umeclidinium-vilanterol  1 puff Inhalation QAM       IVF:  Infusion Meds[1]    Allergies[2]      Pertinent labs:  Recent Labs   Lab 08/17/22  0441 08/16/22  0351 08/15/22  0309 08/14/22  0531 08/13/22  0322 08/12/22  0521   Sodium 140 140 142 144 142 143    Potassium 4.9 4.7 3.9 3.8 3.7 3.8   Chloride 101 101 103 102  103 105   CO2 23 24 26 25 28 23    BUN 39* 26* 16 29* 15 32*   Creatinine 4.8* 3.6* 2.7* 4.0* 2.9* 4.7*   Glucose 88 119* 103* 113* 96 91   Calcium 8.9 8.9 8.8 8.9 8.2 8.7   Magnesium 1.9 1.9 1.8 1.8 1.7 1.8   Phosphorus 2.9 2.6 2.3 3.3 3.3 4.9*   GFR 8.4* 11.9* 16.8* 10.5* 15.4* 8.6*   WBC  --  14.97* 14.98* 14.58* 13.52* 11.02*   Hematocrit  --  23.0* 25.5* 27.3* 23.1* 23.2*   Hemoglobin  --  7.3* 8.1* 8.7* 7.3* 7.2*                                                                     Intervention     Nutrition recommendation - Please refer to top of note                                                                Monitoring/Evaluation     Goals:     Patient to meet >75% of estimated needs through meals and ONS on f/u.- new    Nutrition Risk Level: High (will follow up at least 2 times per week and PRN)     Jilda Roche MS, RDN  Clinical Dietitian PRN           [1]   Current Facility-Administered Medications   Medication Dose Route Frequency Last Rate   [2]   Allergies  Allergen Reactions    Metrizamide Shortness Of Breath and Nausea Only    Rosuvastatin Other (See Comments)     myopathy    myopathy   myopathy    myopathy myopathy      myopathy    myopathy, myopathy    Iodinated Contrast Media Other (See Comments)     Nausea and SOB per patient   On Dialysisi    Nausea and SOB per patient    Nausea and SOB per patient      Nausea and SOB per patient   Nausea and SOB per patient    Nausea and SOB per patient      Nausea and SOB per patient Nausea and SOB per patient      Nausea and SOB per patient Nausea and SOB per patient Nausea and SOB per patient      Nausea and SOB per patient  Nausea and SOB per patient Nausea and SOB per patient    Nausea and SOB per patient   Nausea and SOB per patient    Pioglitazone      Weight gain    Sulfa Antibiotics Hives     blister

## 2022-08-17 NOTE — Progress Notes (Signed)
Infectious Diseases & Tropical Medicine  Progress Note    08/17/2022   Shelby Barron VQQ:[id number]:[id number] is a 82 y.o. female,       Assessment:     Encephalopathy  Acute CVA  Bladder cancer  Status post radical cystectomy/bilateral nephrectomy, enterectomy and lysis of adhesion (06/28/2022)  S/p small bowel resection  Abdominal wall wound VAC in place  End-stage renal disease on hemodialysis  Atrial fibrillation  Risk of aspiration  Leukocytosis stable    Plan:     Continue monitoring without antibiotics  Continue local wound care  Follow-up chest x-ray  Continue probiotics  Monitor electrolytes and renal functions closely  Monitor clinically  Discussed with nursing staff    ROS:     General: No fever, awake and alert, confused  Respiratory: No cough or shortness of breath  Gastrointestinal: No diarrhea per nurse, complains of abdominal pain  Genito-Urinary: no hematuria  Musculoskeletal: no edema  Neurological: awake  Dermatological: No rash    Physical Examination:     Blood pressure 103/53, pulse 86, temperature 99.3 F (37.4 C), temperature source Axillary, resp. rate 18, height 1.575 m (5\' 2" ), weight 81.2 kg (179 lb 0.2 oz), SpO2 95%.    General Appearance: Comfortable  HEENT: Pupils are equal, round, and reactive to light.   Lungs: Clear to auscultation  Heart: Regular rate and rhythm  Chest: Symmetric chest wall expansion.   Abdomen: soft, no hepatosplenomegaly, no bowel sounds, abdominal wall wound VAC in place  Neurological: awake  Extremities: No edema    Laboratory And Diagnostic Studies:     Recent Labs     08/16/22  0351 08/15/22  0309   WBC 14.97* 14.98*   Hemoglobin 7.3* 8.1*   Hematocrit 23.0* 25.5*   Platelet Count 367* 363*     Recent Labs     08/17/22  0441 08/16/22  0351   Sodium 140 140   Potassium 4.9 4.7   Chloride 101 101   CO2 23 24   BUN 39* 26*   Creatinine 4.8* 3.6*   Glucose 88 119*   Calcium 8.9 8.9     Recent Labs     08/17/22  0441 08/16/22  0351   Albumin 1.4* 1.5*          Current Meds:      Scheduled Meds: PRN Meds:    acetaminophen, 650 mg, Oral, 4 times per day   Or  acetaminophen, 650 mg, Oral, 4 times per day  amiodarone, 200 mg, per NG tube, Daily  apixaban, 2.5 mg, Oral, Q12H SCH  atorvastatin, 40 mg, Oral, QHS  b complex-vitamin c-folic acid, 1 tablet, Oral, Daily  darbepoetin alfa, 0.45 mcg/kg, Subcutaneous, Weekly  lidocaine, 20 mL, Intradermal, Once  megestrol, 400 mg, Oral, Daily  midodrine, 10 mg, Oral, Q Tue/Thu/Sat (AM)  midodrine, 5 mg, Oral, BID Meals  pantoprazole, 40 mg, per NG tube, Q12H SCH  polyethylene glycol, 17 g, Oral, Daily  senna-docusate, 1 tablet, Oral, QHS  thiamine, 100 mg, per NG tube, Daily  umeclidinium-vilanterol, 1 puff, Inhalation, QAM        Continuous Infusions:       sodium chloride, , PRN  acetaminophen, 650 mg, Q4H PRN   Or  acetaminophen, 650 mg, Q4H PRN   Or  acetaminophen, 650 mg, Q4H PRN  albumin human, 100 mL, PRN  albuterol-ipratropium, 3 mL, Q6H PRN  benzocaine, 1 spray, TID PRN  calcium carbonate, 500 mg, Q4H PRN  dextrose, 15 g of glucose,  PRN   Or  dextrose, 12.5 g, PRN   Or  dextrose, 12.5 g, PRN   Or  glucagon (rDNA), 1 mg, PRN  diphenhydrAMINE, 25 mg, Q6H PRN  hydrALAZINE, 10 mg, Q3H PRN  labetalol, 10 mg, Q15 Min PRN  lidocaine 2% jelly, , Once PRN  naloxone, 0.4 mg, PRN  ondansetron, 4 mg, Q6H PRN  phenol, 1 spray, Q2H PRN  sodium chloride, 100 mL, Q1H PRN  sodium chloride, 250 mL, PRN  traMADol, 50 mg, Q8H PRN          Shelby Barron A. Janalyn Rouse, M.D.  08/17/2022  9:20 AM

## 2022-08-17 NOTE — Progress Notes (Signed)
CM reviewed chart and events yesterday evening/concerns noted by daughter. Attending to follow up with daughter regarding concerns.     CM stopped by room - Patient is starting HD now.     CM will follow patient/ follow up to confirm if patient is still medically ready for discharge after dialysis today.     CM notes daughter would like to use a private transport company.     Marlan Palau, MSW, ACM-SW  Social Worker Case Manager II  [facility] [address]

## 2022-08-18 LAB — COMPREHENSIVE METABOLIC PANEL
ALT: 10 U/L (ref 0–55)
AST (SGOT): 10 U/L (ref 5–41)
Albumin/Globulin Ratio: 0.7 — ABNORMAL LOW (ref 0.9–2.2)
Albumin: 1.9 g/dL — ABNORMAL LOW (ref 3.5–5.0)
Alkaline Phosphatase: 108 U/L (ref 37–117)
Anion Gap: 15 (ref 5.0–15.0)
BUN: 18 mg/dL (ref 7–21)
Bilirubin, Total: 0.3 mg/dL (ref 0.2–1.2)
CO2: 24 mEq/L (ref 17–29)
Calcium: 8.6 mg/dL (ref 7.9–10.2)
Chloride: 102 mEq/L (ref 99–111)
Creatinine: 2.7 mg/dL — ABNORMAL HIGH (ref 0.4–1.0)
GFR: 16.8 mL/min/{1.73_m2} — ABNORMAL LOW (ref >=60.0)
Globulin: 2.8 g/dL (ref 2.0–3.6)
Glucose: 128 mg/dL — ABNORMAL HIGH (ref 70–100)
Potassium: 4.1 mEq/L (ref 3.5–5.3)
Protein, Total: 4.7 g/dL — ABNORMAL LOW (ref 6.0–8.3)
Sodium: 141 mEq/L (ref 135–145)

## 2022-08-18 LAB — LAB USE ONLY - CBC WITH DIFFERENTIAL
Absolute Basophils: 0.04 10*3/uL (ref 0.00–0.08)
Absolute Eosinophils: 0.01 10*3/uL (ref 0.00–0.44)
Absolute Immature Granulocytes: 0.32 10*3/uL — ABNORMAL HIGH (ref 0.00–0.07)
Absolute Lymphocytes: 3.08 10*3/uL (ref 0.42–3.22)
Absolute Monocytes: 1.86 10*3/uL — ABNORMAL HIGH (ref 0.21–0.85)
Absolute Neutrophils: 10.8 10*3/uL — ABNORMAL HIGH (ref 1.10–6.33)
Absolute nRBC: 0 10*3/uL (ref <=0.00)
Basophils %: 0.2 %
Eosinophils %: 0.1 %
Hematocrit: 26.1 % — ABNORMAL LOW (ref 34.7–43.7)
Hemoglobin: 8.4 g/dL — ABNORMAL LOW (ref 11.4–14.8)
Immature Granulocytes %: 2 %
Lymphocytes %: 19.1 %
MCH: 29.5 pg (ref 25.1–33.5)
MCHC: 32.2 g/dL (ref 31.5–35.8)
MCV: 91.6 fL (ref 78.0–96.0)
MPV: 9 fL (ref 8.9–12.5)
Monocytes %: 11.5 %
Neutrophils %: 67.1 %
Platelet Count: 321 10*3/uL (ref 142–346)
Preliminary Absolute Neutrophil Count: 10.8 10*3/uL — ABNORMAL HIGH (ref 1.10–6.33)
RBC: 2.85 10*6/uL — ABNORMAL LOW (ref 3.90–5.10)
RDW: 15 % (ref 11–15)
WBC: 16.11 10*3/uL — ABNORMAL HIGH (ref 3.10–9.50)
nRBC %: 0 /100 WBC (ref <=0.0)

## 2022-08-18 LAB — MAGNESIUM: Magnesium: 1.7 mg/dL (ref 1.6–2.6)

## 2022-08-18 LAB — PHOSPHORUS: Phosphorus: 2.3 mg/dL (ref 2.3–4.7)

## 2022-08-18 LAB — WHOLE BLOOD GLUCOSE POCT
Whole Blood Glucose POCT: 110 mg/dL — ABNORMAL HIGH (ref 70–100)
Whole Blood Glucose POCT: 111 mg/dL — ABNORMAL HIGH (ref 70–100)

## 2022-08-18 LAB — CALCIUM, IONIZED: Calcium, Ionized: 2.31 mEq/L (ref 2.23–2.56)

## 2022-08-18 MED ORDER — VANCOMYCIN HCL 25 MG/ML PO SOLR
250.0000 mg | Freq: Four times a day (QID) | ORAL | Status: DC
Start: 2022-08-18 — End: 2022-08-21

## 2022-08-18 NOTE — Progress Notes (Signed)
4 eyes in 4 hours pressure injury assessment note:      Completed with:   Unit & Time admitted:              Bony Prominences: Check appropriate box; if wound is present enter wound assessment in LDA     Occiput:                 [x] WNL  []  Wound present  Face:                     [x] WNL  []  Wound present  Ears:                      [x] WNL  []  Wound present  Spine:                    [x] WNL  []  Wound present  Shoulders:             [x] WNL  []  Wound present  Elbows:                  [x] WNL  []  Wound present  Sacrum/coccyx:     [] WNL  [x]  Wound present  Ischial Tuberosity:  [] WNL  [x]  Wound present  Trochanter/Hip:      [x] WNL  []  Wound present  Knees:                   [x] WNL  []  Wound present  Ankles:                   [x] WNL  []  Wound present  Heels:                    [x] WNL  []  Wound present  Other pressure areas:  [x]  Wound location surgical wound, abdomin       Device related: []  Device name:         LDA completed if wound present: yes/no  Consult WOCN if necessary    Other skin related issues, ie tears, rash, etc, document in Integumentary flowsheet

## 2022-08-18 NOTE — Plan of Care (Signed)
Problem: Moderate/High Fall Risk Score >5  Goal: Patient will remain free of falls  Outcome: Progressing  Flowsheets (Taken 08/17/2022 2000)  High (Greater than 13):   HIGH-Visual cue at entrance to patient's room   HIGH-Bed alarm on at all times while patient in bed   HIGH-Utilize chair pad alarm for patient while in the chair   HIGH-Apply yellow "Fall Risk" arm band   HIGH-Initiate use of floor mats as appropriate   HIGH-Pharmacy to initiate evaluation and intervention per protocol   HIGH-Consider use of low bed     Problem: Compromised Moisture  Goal: Moisture level Interventions  Outcome: Progressing  Flowsheets (Taken 08/18/2022 0040)  Moisture level Interventions: Moisture wicking products, Moisture barrier cream     Problem: Compromised Activity/Mobility  Goal: Activity/Mobility Interventions  Outcome: Progressing  Flowsheets (Taken 08/18/2022 0040)  Activity/Mobility Interventions: Pad bony prominences, TAP Seated positioning system when OOB, Promote PMP, Reposition q 2 hrs / turn clock, Offload heels     Problem: Impaired Mobility  Goal: Mobility/Activity is maintained at optimal level for patient  Outcome: Progressing  Flowsheets (Taken 08/18/2022 0040)  Mobility/activity is maintained at optimal level for patient: Encourage independent activity per ability     Problem: Pain  Goal: Pain at adequate level as identified by patient  Outcome: Progressing  Flowsheets (Taken 08/18/2022 0040)  Pain at adequate level as identified by patient:   Identify patient comfort function goal   Assess for risk of opioid induced respiratory depression, including snoring/sleep apnea. Alert healthcare team of risk factors identified.   Assess pain on admission, during daily assessment and/or before any "as needed" intervention(s)   Reassess pain within 30-60 minutes of any procedure/intervention, per Pain Assessment, Intervention, Reassessment (AIR) Cycle   Evaluate if patient comfort function goal is met   Evaluate patient's  satisfaction with pain management progress   Offer non-pharmacological pain management interventions     Problem: Hemodynamic Status: Cardiac  Goal: Stable vital signs and fluid balance  Outcome: Progressing  Flowsheets (Taken 08/18/2022 0040)  Stable vital signs and fluid balance:   Assess signs and symptoms associated with cardiac rhythm changes   Monitor lab values     Problem: Renal Instability  Goal: Fluid and electrolyte balance are achieved/maintained  Outcome: Progressing  Flowsheets (Taken 08/18/2022 0040)  Fluid and electrolyte balance are achieved/maintained:   Monitor/assess lab values and report abnormal values   Assess and reassess fluid and electrolyte status   Observe for cardiac arrhythmias   Monitor for muscle weakness   Follow fluid restrictions/IV/PO parameters     Problem: Diabetes: Glucose Imbalance  Goal: Blood glucose stable at established goal  Outcome: Progressing  Flowsheets (Taken 08/18/2022 0040)  Blood glucose stable at established goal:   Monitor lab values   Monitor intake and output.  Notify LIP if urine output is < 30 mL/hour.   Follow fluid restrictions/IV/PO parameters   Include patient/family in decisions related to nutrition/dietary selections   Assess for hypoglycemia /hyperglycemia   Monitor/assess vital signs   Coordinate medication administration with meals, as indicated   Ensure appropriate diet and assess tolerance   Ensure adequate hydration     Problem: Every Day - Stroke  Goal: Mobility/Activity is maintained at optimal level for patient  Outcome: Progressing  Flowsheets (Taken 08/18/2022 0040)  Mobility/activity is maintained at optimal level for patient: Encourage independent activity per ability  Goal: Stable vital signs and fluid balance  Outcome: Progressing  Flowsheets (Taken 08/18/2022 0040)  Stable vital signs and  fluid balance:   Position patient for maximum circulation/cardiac output   Monitor and assess vitals every 4 hours or as ordered and hemodynamic  parameters   Monitor intake and output. Notify LIP if urine output is < 30 mL/hour.   Encourage oral fluid intake  Goal: Neurovascular status is stable or improving  Outcome: Progressing  Flowsheets (Taken 08/18/2022 0040)  Neurovascular status is stable or improving: Monitor/assess neurovascular status (pulses, capillary refill, pain, paresthesia, presence of edema)     Problem: Pain interferes with ability to perform ADL  Goal: Pain at adequate level as identified by patient  Outcome: Progressing  Flowsheets (Taken 08/18/2022 0040)  Pain at adequate level as identified by patient:   Identify patient comfort function goal   Assess for risk of opioid induced respiratory depression, including snoring/sleep apnea. Alert healthcare team of risk factors identified.   Assess pain on admission, during daily assessment and/or before any "as needed" intervention(s)   Reassess pain within 30-60 minutes of any procedure/intervention, per Pain Assessment, Intervention, Reassessment (AIR) Cycle   Evaluate if patient comfort function goal is met   Evaluate patient's satisfaction with pain management progress   Offer non-pharmacological pain management interventions

## 2022-08-18 NOTE — Discharge Summary (Signed)
[facility]      Patient: Shelby Barron  Admission Date: 06/28/2022   DOB: 1940-05-05  Discharge Date: 08/18/2022    MRN: [id number]  Discharge Attending:Eve Rey Aquilla Solian, MD     Referring Physician: Patsy Lager, MD  PCP: Shelby Lager, MD       DISCHARGE SUMMARY     Discharge Information   Admission Diagnosis:   Bladder cancer    Discharge Diagnosis:   Hypotension  Encephalopathy  Acute toxic metabolic encephalopathy/delirium  Possible underlying cognitive impairment  Anemia of CKD  Acute blood loss anemia  Status post open cystostomy, bilateral nephrectomies, resection of small bowel after IntraOp enterotomy  Mixed shock, hemorrhagic versus septic  Necrotic wound tissue  Acute CVA  Petechial hemorrhage versus cortical laminar necrosis  Acute hypoxic respiratory failure  COPD  ESRD  A-fib with RVR  Renal cell carcinoma  Urothelial carcinoma  Overweight  Hypercoagulable state  Severe malnutrition    Malnutrition Documentation    Severe malnutrition related to inadequate oral intake in the setting of acute illness as evidenced by PO intake meeting <50% of needs for >5 days, moderate-severe muscle loss (temporalis, deltoid, interosseous, quadriceps, gastrocnemius, trapezius, supraspinatus, infraspinatus), moderate fat loss (orbital, buccal, triceps), and 13% weight loss in >1 month         Admission Condition: Acute distress  Discharge Condition: Stable yet guarded  Consultants: Palliative medicine, infectious disease, GI, cardiology, general surgery, nephrology, critical care, CV IR  Functional Status: Stable  Discharged to: [facility]       Hospital Course   Presentation History   Per admission H&P:    Hospital Course (51 Days)     Patient is an 82 year old female with multiple medical problems including COPD, prior stroke in 2004, type 2 diabetes, RCC status post bilateral nephrectomy, [name], recently diagnosed urothelial cancer, prolonged hospitalization that started on June 28, 2022.  At that moment patient  was admitted due to an elective cystectomy and completion nephrectomy with lower course complicated by small bowel enterotomy intraoperative hemorrhage 1 L EBL status post treatment for septic and hemorrhagic shock in the ICU.  Required CRRT at the time.  Infectious diseases been following since that time.  Was taken back to the OR on 12 June for small bowel anastomosis, abdominal closure and wound VAC.  Extubated June 13, downgraded to floor June 16.  Had right-sided permacath placed by IR on 26 June.  Upgraded back to ICU on 30 June due to lethargy.  Repeat MRI of the brain was conducted which was negative along with EEG.  Was downgraded once more and has been under hospitalist medicine since.  Had new onset A-fib RVR status post amiodarone infusion and Eliquis started 23 July 2022.  Was awaiting discharge.  Family paying out-of-pocket for SNF as she did not seem to be rehab-able. Downgraded from ICU again on 24 July.  Has soft pressures recorded limited dialysis and limited amount of fluids pulled.  Has required albumin and volume provided back during dialysis.  Nephrology started midodrine initially just before dialysis however to change to twice a day to maintain blood pressures.  Started on Megace for appetite improvement.  General surgery curb sided to ask if the patient would be a candidate for PEG tube, due to the malnutrition the patient already is going through, unable to do PEG tube by surgical means.  Could be done in the future by GI if she will tolerate EGD.  Likely unable to tolerate sedation General  otherwise due to very strict blood pressure requirements.  Patient's daughter, Shelby Barron has been kept in touch every day about the patient's progress.  Wanted wound nurse to talk to her on day of discharge.  Wound nurse spoke with her on day of discharge along with MD who spoke with her daily as well.  Patient is okay for discharge from medical standpoint to skilled nursing facility, wound VAC will  be continued at the skilled nursing facility.  Patient is okay to discharge to SNF           Progress Note/Physical Exam at Discharge     Subjective:Patient is stable for discharge.    Vitals:    08/18/22 0500 08/18/22 0600 08/18/22 0700 08/18/22 0800   BP: 143/65 135/72 136/69    Pulse: 87 91 91    Resp: (!) 28 (!) 26 (!) 31    Temp:    99.7 F (37.6 C)   TempSrc:    Axillary   SpO2: 94% 97% 96%    Weight:       Height:               General: NAD, AAOx2  HEENT: Sclera anicteric  Neck: Supple  Cardiovascular: RRR  Lungs: Decreased breath sounds at bases, no crackles or rhonchi noted  Abdomen: Soft, +BS  Extremities: No significant pitting edema  Neuro: No Focal neurological deficits       Diagnostics     Labs/Studies Pending at Discharge: No    Last Labs   Recent Labs   Lab 08/18/22  0230 08/16/22  0351 08/15/22  0309   WBC 16.11* 14.97* 14.98*   RBC 2.85* 2.44* 2.74*   Hemoglobin 8.4* 7.3* 8.1*   Hematocrit 26.1* 23.0* 25.5*   MCV 91.6 94.3 93.1   Platelet Count 321 367* 363*       Recent Labs   Lab 08/18/22  0230 08/17/22  0441 08/16/22  0351 08/15/22  0309 08/14/22  0531   Sodium 141 140 140 142 144   Potassium 4.1 4.9 4.7 3.9 3.8   Chloride 102 101 101 103 102   CO2 24 23 24 26 25    BUN 18 39* 26* 16 29*   Creatinine 2.7* 4.8* 3.6* 2.7* 4.0*   Glucose 128* 88 119* 103* 113*   Calcium 8.6 8.9 8.9 8.8 8.9   Magnesium 1.7 1.9 1.9 1.8 1.8       Microbiology Results (last 15 days)       Procedure Component Value Units Date/Time    Culture, Blood, Aerobic And Anaerobic [[id number]] Collected: 08/11/22 0208    Order Status: Completed Specimen: Blood, Venous Updated: 08/16/22 0700     Culture Blood No growth at 5 days    Culture, Blood, Aerobic And Anaerobic [[id number]] Collected: 08/11/22 0208    Order Status: Completed Specimen: Blood, Venous Updated: 08/16/22 0700     Culture Blood No growth at 5 days             Patient Instructions   Discharge Diet: Full liquids with evaluation by SLP at the skilled nursing  facility and advancement as tolerated  Discharge Activity: Per SNF    Follow Up Appointment:   Follow-up Information       [facility] [place]. Call.    Specialty: Gastroenterology  Why: please call to make f/u in 6 weeks.  Contact information:  [address]  [place] [address]  [phone]  Additional information:  Take [address]  [place] to [place], [place] [address] [place].  Turn right on [address] and then right again at [employer].  Destination will be on your right.               Alla German, MD Follow up.    Specialties: Nephrology, Internal Medicine  Contact information:  [address]  [place] [address]  [phone]               [facility] Follow up in 1 month(s).    Specialty: Cardiology  Contact information:  [address]  [place] [address]  [phone]             Marlaine Hind, MD Follow up in 1 week(s).    Specialty: Surgery  Contact information:  [address]  [place] [address]  [phone]               [facility] Follow up in 1 week(s).    Contact information:  [address]  [address]  [address] [address]             Pcp, Kathreen Cosier, MD .                              Discharge Medications:     Medication List        START taking these medications      amiodarone 200 MG tablet  Commonly known as: PACERONE  Take 1 tablet (200 mg) by mouth daily This is a maintenance dose.     apixaban 2.5 MG  Commonly known as: ELIQUIS  Take 1 tablet (2.5 mg) by mouth every 12 (twelve) hours     b complex-vitamin c-folic acid 0.8 MG Tabs  Take 1 tablet by mouth daily     carvedilol 12.5 MG tablet  Commonly known as: COREG  Take 1 tablet (12.5 mg) by mouth every 12 (twelve) hours     * megestrol 40 MG tablet  Commonly known as: MEGACE  Take 1 tablet (40 mg) by mouth 4 (four) times daily     * megestrol acetate 400 MG/10ML Susp  Commonly known  as: MEGACE  Take 10 mLs (400 mg) by mouth daily     * midodrine 10 MG tablet  Commonly known as: PROAMATINE  Take 1 tablet (10 mg) by mouth Once daily every Tuesday, Thursday and Saturday morning     * midodrine 5 MG tablet  Commonly known as: PROAMATINE  Take 1 tablet (5 mg) by mouth 2 (two) times daily with meals     pantoprazole 40 mg/20 mL Susp  Commonly known as: PROTONIX  Take 20 mLs (40 mg) by mouth every 12 (twelve) hours  Replaces: pantoprazole 40 MG tablet     * polyethylene glycol 17 g packet  Commonly known as: MIRALAX  Take 17 g by mouth daily as needed (constipation)     * polyethylene glycol 17 g packet  Commonly known as: MIRALAX  Take 17 g by mouth daily           * This list has 6 medication(s) that are the same as other medications prescribed for you. Read the directions carefully, and ask your doctor or other care provider to review them with you.  CONTINUE taking these medications      Anoro Ellipta 62.5-25 MCG/ACT Aepb  Generic drug: umeclidinium-vilanterol     atorvastatin 40 MG tablet  Commonly known as: LIPITOR     cinacalcet 30 MG tablet  Commonly known as: SENSIPAR     conjugated estrogens vaginal cream  Commonly known as: PREMARIN     montelukast 10 MG tablet  Commonly known as: SINGULAIR     ondansetron 4 MG tablet  Commonly known as: Zofran  Take 1 tablet (4 mg) by mouth every 8 (eight) hours as needed for Nausea     oxybutynin XL 5 MG 24 hr tablet  Commonly known as: DITROPAN-XL     sevelamer carbonate 800 MG tablet  Commonly known as: RENVELA            STOP taking these medications      allopurinol 100 MG tablet  Commonly known as: ZYLOPRIM     AMBIEN PO     b complex vitamins capsule     clopidogrel 75 mg tablet  Commonly known as: PLAVIX     hyoscyamine 0.125 MG tablet  Commonly known as: LEVSIN     insulin aspart 100 UNIT/ML injection  Commonly known as: NovoLOG     metoprolol tartrate 25 MG tablet  Commonly known as: LOPRESSOR     oxyCODONE 5 MG capsule  Commonly  known as: OXY-IR     pantoprazole 40 MG tablet  Commonly known as: PROTONIX  Replaced by: pantoprazole 40 mg/20 mL Susp     pregabalin 50 MG capsule  Commonly known as: LYRICA     torsemide 100 MG tablet  Commonly known as: DEMADEX     Tresiba 100 UNIT/ML Soln  Generic drug: Insulin Degludec            ASK your doctor about these medications      oxyCODONE 5 MG immediate release tablet  Commonly known as: ROXICODONE  Take 0.5 tablets (2.5 mg) by mouth every 8 (eight) hours as needed for Pain  Ask about: Should I take this medication?     traMADol 50 MG tablet  Commonly known as: ULTRAM  Take 1 tablet (50 mg) by mouth every 8 (eight) hours as needed for Pain  Ask about: Should I take this medication?               Where to Get Your Medications        These medications were sent to [id number] - [place], [place] - [address]  [address], [place] [address]      Phone: [phone]   megestrol 40 MG tablet  megestrol acetate 400 MG/10ML Susp  midodrine 10 MG tablet  midodrine 5 MG tablet  polyethylene glycol 17 g packet  traMADol 50 MG tablet       You can get these medications from any pharmacy    Bring a paper prescription for each of these medications  oxyCODONE 5 MG immediate release tablet       Information about where to get these medications is not yet available    Ask your nurse or doctor about these medications  amiodarone 200 MG tablet  apixaban 2.5 MG  b complex-vitamin c-folic acid 0.8 MG Tabs  carvedilol 12.5 MG tablet  pantoprazole 40 mg/20 mL Susp  polyethylene glycol 17 g packet          Time spent examining patient, discussing with patient/family regarding hospital course,  chart review, reconciling medications and discharge planning: 60 minutes.    Signed,  Gwyndolyn Kaufman, MD    9:17 AM 08/18/2022

## 2022-08-18 NOTE — Progress Notes (Signed)
Assumed care at 0340 from [facility].

## 2022-08-18 NOTE — Plan of Care (Addendum)
Neuro: Oriented to self/place, follows commands, able to move all extremities, pupils brisk and a size 4 in both.   CV: NSR, HR 90's, SBP 130's, goal MAP > 65 mmhg.  Resp: RA, diminished lung sounds, sating in the 90's.   GI: Pureed - mild thick liquid diet. Poor appetite.   GU: Anuric. HD pt.   Skin: Abdominal surgical incision covered with mepilexes. Skin tears on sacrum, buttocks, and coccyx.   Access: 20 G R AC. 20 G R FA. R permacath.      Significant Event(s):   - Pt was discharged at 1300 and was transported down via stretcher. After visit summary (AVS) was explained to the pt and family given to the family. PIV(s) were removed and the pt took all their belongings with them. Family at bedside the whole time.    - Report was given to Samira RN at the SNF facility at 1200.     Problem: Moderate/High Fall Risk Score >5  Goal: Patient will remain free of falls  Outcome: Progressing  Flowsheets (Taken 08/15/2022 1957 by Davy Pique, RN)  VH High Risk (Greater than 13):   BED ALARM WILL BE ACTIVATED WHEN THE PATEINT IS IN BED WITH SIGNAGE "RESET BED ALARM"   RED "HIGH FALL RISK" SIGNAGE   ALL REQUIRED MODERATE INTERVENTIONS   PATIENT IS TO BE SUPERVISED FOR ALL TOILETING ACTIVITIES   Keep door open for better visibility   Use of roll guard   Use chair-pad alarm device   Use assistive devices   Request PT/OT therapy consult order from physician for patients with gait/mobility impairment     Problem: Compromised Nutrition  Goal: Nutrition Interventions  Outcome: Progressing  Flowsheets (Taken 08/17/2022 0800 by Luz Brazen, RN)  Nutrition Interventions: Discuss nutrition at Rounds, I&Os, Document % meal eaten, Daily weights     Problem: Infection Prevention  Goal: Free from infection  Outcome: Progressing  Flowsheets (Taken 08/17/2022 0900 by Luz Brazen, RN)  Free from infection:   Monitor/assess vital signs   Assess incision for evidence of healing   Monitor/assess output from surgical drain if  present   Assess for signs and symptoms of infection   Encourage/assist patient to turn, cough and perform deep breathing every 2 hours     Problem: Safety  Goal: Patient will be free from injury during hospitalization  Outcome: Progressing  Flowsheets (Taken 08/17/2022 0900 by Luz Brazen, RN)  Patient will be free from injury during hospitalization:   Assess patient's risk for falls and implement fall prevention plan of care per policy   Use appropriate transfer methods   Include patient/ family/ care giver in decisions related to safety   Assess for patients risk for elopement and implement Elopement Risk Plan per policy   Provide alternative method of communication if needed (communication boards, writing)   Hourly rounding   Ensure appropriate safety devices are available at the bedside   Provide and maintain safe environment  Goal: Patient will be free from infection during hospitalization  Outcome: Progressing  Flowsheets (Taken 08/17/2022 0900 by Luz Brazen, RN)  Free from Infection during hospitalization:   Assess and monitor for signs and symptoms of infection   Monitor all insertion sites (i.e. indwelling lines, tubes, urinary catheters, and drains)   Encourage patient and family to use good hand hygiene technique   Monitor lab/diagnostic results     Problem: Hemodynamic Status: Cardiac  Goal: Stable vital signs and fluid balance  Outcome: Progressing  Flowsheets (Taken 08/18/2022  0040 by Biagio Quint Simdi, RN)  Stable vital signs and fluid balance:   Assess signs and symptoms associated with cardiac rhythm changes   Monitor lab values     Problem: Neurological Deficit  Goal: Neurological status is stable or improving  Outcome: Progressing  Flowsheets (Taken 08/17/2022 0900 by Luz Brazen, RN)  Neurological status is stable or improving:   Monitor/assess/document neurological assessment (Stroke: every 4 hours)   Monitor/assess NIH Stroke Scale   Observe for seizure activity and initiate  seizure precautions if indicated   Perform CAM Assessment     Problem: Every Day - Stroke  Goal: Neurological status is stable or improving  Outcome: Progressing  Flowsheets (Taken 08/17/2022 0900 by Luz Brazen, RN)  Neurological status is stable or improving:   Monitor/assess/document neurological assessment (Stroke: every 4 hours)   Monitor/assess NIH Stroke Scale   Observe for seizure activity and initiate seizure precautions if indicated   Perform CAM Assessment     Problem: Day of Discharge - Stroke  Goal: Able to express understanding of discharge instructions and stroke education  Outcome: Progressing  Flowsheets (Taken 08/08/2022 1803 by Wess Botts, RN)  Able to express understanding of discharge instructions and stroke education:   Provide alternative method of communication if needed   Consult/collaborate with Case Management/Social Work   Include patient care companion in decisions related to communication   Patient/patient care companion demonstrates understanding on disease process, treatment plan, medications and discharge plan   Ensure "Discharge instructions: Stroke" appears on discharge paperwork  (After Visit Summary-AVS)  Goal: Core/Quality measures - Discharge  Outcome: Progressing  Flowsheets (Taken 08/11/2022 0055 by Annamarie Major, RN)  Core/Quality measures - Discharge: Document discharge NIH Stroke Scale

## 2022-08-18 NOTE — Progress Notes (Addendum)
Patient will discharge to [facility] SNF (private pay) today. Patient will be going to room 119B. Nurse to call report to [phone]. Patient will continue dialysis at the facility.    Daughter arranged her own [employer] transport with [employer], pickup ETA between 12:30 PM and 1:00 PM. CM confirmed pickup with [place] with the company, [phone].     08/18/22 0814   Discharge Disposition   Patient preference/choice provided? Yes   Physical Discharge Disposition SNF   Was patient sent with auth pending to the post-acute facility? No   Receiving facility, unit and room number: [facility], Room 119B   Nursing report phone number: [phone]   Mode of Transportation Other (comment)  (private pay stretcher)   Pick up time 12:30 PM - 1:00 PM   Patient/Family/POA notified of transfer plan Yes   Patient agreeable to discharge plan/expected d/c date? Yes   Family/POA agreeable to discharge plan/expected d/c date? Yes   Bedside nurse notified of transport plan? Yes   CM Interventions   Multidisciplinary rounds/family meeting before d/c? Yes   Medicare Checklist   Is this a Medicare patient? Yes   Patient received 1st IMM Letter? Yes   If LOS 3 days or greater, did patient received 2nd IMM Letter? Yes     Marlan Palau, MSW, ACM-SW  Social Worker Case Manager II  [facility]

## 2022-08-18 NOTE — Progress Notes (Signed)
Infectious Diseases & Tropical Medicine  Progress Note    08/18/2022   Shelby Barron,Shelby Barron is a 82 y.o. female,       Assessment:     Acute CVA  Bladder cancer  Status post radical cystectomy/bilateral nephrectomy, enterectomy and lysis of adhesion (06/28/2022)  S/p small bowel resection  Abdominal wall wound VAC in place  End-stage renal disease on hemodialysis  Atrial fibrillation  Risk of aspiration  Leukocytosis-likely reactive    Plan:     Likely discharge today  Continue monitoring without antibiotics  Continue local wound care  Follow-up chest x-ray  Continue probiotics  Monitor electrolytes and renal functions closely  Monitor clinically  Discussed with nursing staff  Discussed with Dr.Chinery    ROS:     General: No fever, awake, comfortable, no new issues  Respiratory: No cough or shortness of breath  Gastrointestinal: Abdominal wall wound  Genito-Urinary: no hematuria  Musculoskeletal: no edema  Neurological: awake  Dermatological: No rash    Physical Examination:     Blood pressure 136/69, pulse 91, temperature 99.7 F (37.6 C), temperature source Axillary, resp. rate (!) 31, height 1.575 m (5\' 2" ), weight 81.2 kg (179 lb 0.2 oz), SpO2 96%.    General Appearance: Awake and alert  HEENT: Pupils are equal, round, and reactive to light.   Lungs: Clear to auscultation  Heart: Regular rate and rhythm  Chest: Symmetric chest wall expansion.   Abdomen: soft, no hepatosplenomegaly, no bowel sounds, abdominal wall wound VAC in place  Neurological: awake  Extremities: No edema    Laboratory And Diagnostic Studies:     Recent Labs     08/18/22  0230 08/16/22  0351   WBC 16.11* 14.97*   Hemoglobin 8.4* 7.3*   Hematocrit 26.1* 23.0*   Platelet Count 321 367*     Recent Labs     08/18/22  0230 08/17/22  0441   Sodium 141 140   Potassium 4.1 4.9   Chloride 102 101   CO2 24 23   BUN 18 39*   Creatinine 2.7* 4.8*   Glucose 128* 88   Calcium 8.6 8.9     Recent Labs     08/18/22  0230 08/17/22  0441    AST (SGOT) 10  --    ALT 10  --    Alkaline Phosphatase 108  --    Protein, Total 4.7*  --    Albumin 1.9* 1.4*         Current Meds:      Scheduled Meds: PRN Meds:    acetaminophen, 650 mg, Oral, 4 times per day   Or  acetaminophen, 650 mg, Oral, 4 times per day  amiodarone, 200 mg, per NG tube, Daily  apixaban, 2.5 mg, Oral, Q12H SCH  atorvastatin, 40 mg, Oral, QHS  b complex-vitamin c-folic acid, 1 tablet, Oral, Daily  darbepoetin alfa, 0.45 mcg/kg, Subcutaneous, Weekly  lidocaine, 20 mL, Intradermal, Once  megestrol, 400 mg, Oral, Daily  midodrine, 10 mg, Oral, Q Tue/Thu/Sat (AM)  midodrine, 5 mg, Oral, BID Meals  pantoprazole, 40 mg, per NG tube, Q12H SCH  polyethylene glycol, 17 g, Oral, Daily  senna-docusate, 1 tablet, Oral, QHS  thiamine, 100 mg, per NG tube, Daily  umeclidinium-vilanterol, 1 puff, Inhalation, QAM        Continuous Infusions:       sodium chloride, , PRN  sodium chloride, , PRN  acetaminophen, 650 mg, Q4H PRN   Or  acetaminophen, 650 mg, Q4H  PRN   Or  acetaminophen, 650 mg, Q4H PRN  albuterol-ipratropium, 3 mL, Q6H PRN  benzocaine, 1 spray, TID PRN  calcium carbonate, 500 mg, Q4H PRN  dextrose, 15 g of glucose, PRN   Or  dextrose, 12.5 g, PRN   Or  dextrose, 12.5 g, PRN   Or  glucagon (rDNA), 1 mg, PRN  diphenhydrAMINE, 25 mg, Q6H PRN  hydrALAZINE, 10 mg, Q3H PRN  labetalol, 10 mg, Q15 Min PRN  lidocaine 2% jelly, , Once PRN  naloxone, 0.4 mg, PRN  ondansetron, 4 mg, Q6H PRN  phenol, 1 spray, Q2H PRN  traMADol, 50 mg, Q8H PRN          Whitaker Holderman A. Janalyn Rouse, M.D.  08/18/2022  9:24 AM

## 2022-08-18 NOTE — Progress Notes (Signed)
4 eyes in 4 hours pressure injury assessment note:      Completed with:  Cristela Felt RN at 0800  Unit & Time admitted:              Bony Prominences: Check appropriate box; if wound is present enter wound assessment in LDA     Occiput:                 [x] WNL  []  Wound present  Face:                     [x] WNL  []  Wound present  Ears:                      [x] WNL  []  Wound present  Spine:                    [x] WNL  []  Wound present  Shoulders:             [x] WNL  []  Wound present  Elbows:                  [x] WNL  []  Wound present  Sacrum/coccyx:     [] WNL  [x]  Wound present  Ischial Tuberosity:  [x] WNL  []  Wound present  Trochanter/Hip:      [x] WNL  []  Wound present  Knees:                   [x] WNL  []  Wound present  Ankles:                   [x] WNL  []  Wound present  Heels:                    [x] WNL  []  Wound present  Other pressure areas:  []  Wound location       Device related: []  Device name:         LDA completed if wound present: yes/no  Consult WOCN if necessary    Other skin related issues, ie tears, rash, etc, document in Integumentary flowsheet

## 2022-08-18 NOTE — Discharge Summary (Signed)
[facility]      Patient: Shelby Barron  Admission Date: 06/28/2022   DOB: [birth year]  Discharge Date: 08/18/2022    MRN: [id number]  Discharge Attending:Enrika Aguado Aquilla Solian, MD     Referring Physician: Patsy Lager, MD  PCP: Patsy Lager, MD       DISCHARGE SUMMARY     Discharge Information   Admission Diagnosis:   Bladder cancer    Discharge Diagnosis:   Hypotension  Encephalopathy  Acute toxic metabolic encephalopathy/delirium  Possible underlying cognitive impairment  Anemia of CKD  Acute blood loss anemia  Status post open cystostomy, bilateral nephrectomies, resection of small bowel after IntraOp enterotomy  Mixed shock, hemorrhagic versus septic  Necrotic wound tissue  Acute CVA  Petechial hemorrhage versus cortical laminar necrosis  Acute hypoxic respiratory failure  COPD  ESRD  A-fib with RVR  Renal cell carcinoma  Urothelial carcinoma  Overweight  Hypercoagulable state  Severe malnutrition  Frequent  stools     Malnutrition Documentation    Severe malnutrition related to inadequate oral intake in the setting of acute illness as evidenced by PO intake meeting <50% of needs for >5 days, moderate-severe muscle loss (temporalis, deltoid, interosseous, quadriceps, gastrocnemius, trapezius, supraspinatus, infraspinatus), moderate fat loss (orbital, buccal, triceps), and 13% weight loss in >1 month         Admission Condition: Acute distress  Discharge Condition: Stable yet guarded  Consultants: Palliative medicine, infectious disease, GI, cardiology, general surgery, nephrology, critical care, CV IR  Functional Status: Stable  Discharged to: [facility]       Hospital Course   Presentation History   Per admission H&P:    Hospital Course (51 Days)     Patient is an 82 year old female with multiple medical problems including COPD, prior stroke in 2004, type 2 diabetes, RCC status post bilateral nephrectomy, [name], recently diagnosed urothelial cancer, prolonged hospitalization that started on June 28, 2022.  At  that moment patient was admitted due to an elective cystectomy and completion nephrectomy with lower course complicated by small bowel enterotomy intraoperative hemorrhage 1 L EBL status post treatment for septic and hemorrhagic shock in the ICU.  Required CRRT at the time.  Infectious diseases been following since that time.  Was taken back to the OR on 12 June for small bowel anastomosis, abdominal closure and wound VAC.  Extubated June 13, downgraded to floor June 16.  Had right-sided permacath placed by IR on 26 June.  Upgraded back to ICU on 30 June due to lethargy.  Repeat MRI of the brain was conducted which was negative along with EEG.  Was downgraded once more and has been under hospitalist medicine since.  Had new onset A-fib RVR status post amiodarone infusion and Eliquis started 23 July 2022.  Was awaiting discharge.  Family paying out-of-pocket for SNF as she did not seem to be rehab-able. Downgraded from ICU again on 24 July.  Has soft pressures recorded limited dialysis and limited amount of fluids pulled.  Has required albumin and volume provided back during dialysis.  Nephrology started midodrine initially just before dialysis however to change to twice a day to maintain blood pressures.  Started on Megace for appetite improvement.  General surgery curb sided to ask if the patient would be a candidate for PEG tube, due to the malnutrition the patient already is going through, unable to do PEG tube by surgical means.  Could be done in the future by GI if she will tolerate EGD.  Likely  unable to tolerate sedation General otherwise due to very strict blood pressure requirements.  Patient's daughter, Shelby Barron has been kept in touch every day about the patient's progress.  Wanted wound nurse to talk to her on day of discharge.  Wound nurse spoke with her on day of discharge along with MD who spoke with her daily as well.  Patient is okay for discharge from medical standpoint to skilled nursing  facility, wound VAC will be continued at the skilled nursing facility.  Patient is okay to discharge to SNF  Per my discussion with infectious diseases because of frequent diarrhea has she should be treated with vancomycin  250 mg p.o. 4 times daily for 10 days for C. difficile prophylaxis           Progress Note/Physical Exam at Discharge     Subjective:Patient is stable for discharge.    Vitals:    08/18/22 0700 08/18/22 0800 08/18/22 0926 08/18/22 1141   BP: 136/69 139/69     Pulse: 91 94     Resp: (!) 31 (!) 31     Temp:  99.7 F (37.6 C)  99.9 F (37.7 C)   TempSrc:  Axillary  Axillary   SpO2: 96% 96% 97%    Weight:       Height:               General: NAD, AAOx2  HEENT: Sclera anicteric  Neck: Supple  Cardiovascular: RRR  Lungs: Decreased breath sounds at bases, no crackles or rhonchi noted  Abdomen: Soft, +BS  Extremities: No significant pitting edema  Neuro: No Focal neurological deficits       Diagnostics     Labs/Studies Pending at Discharge: No    Last Labs   Recent Labs   Lab 08/18/22  0230 08/16/22  0351 08/15/22  0309   WBC 16.11* 14.97* 14.98*   RBC 2.85* 2.44* 2.74*   Hemoglobin 8.4* 7.3* 8.1*   Hematocrit 26.1* 23.0* 25.5*   MCV 91.6 94.3 93.1   Platelet Count 321 367* 363*       Recent Labs   Lab 08/18/22  0230 08/17/22  0441 08/16/22  0351 08/15/22  0309 08/14/22  0531   Sodium 141 140 140 142 144   Potassium 4.1 4.9 4.7 3.9 3.8   Chloride 102 101 101 103 102   CO2 24 23 24 26 25    BUN 18 39* 26* 16 29*   Creatinine 2.7* 4.8* 3.6* 2.7* 4.0*   Glucose 128* 88 119* 103* 113*   Calcium 8.6 8.9 8.9 8.8 8.9   Magnesium 1.7 1.9 1.9 1.8 1.8       Microbiology Results (last 15 days)       Procedure Component Value Units Date/Time    Culture, Blood, Aerobic And Anaerobic [[id number]] Collected: 08/11/22 0208    Order Status: Completed Specimen: Blood, Venous Updated: 08/16/22 0700     Culture Blood No growth at 5 days    Culture, Blood, Aerobic And Anaerobic [[id number]] Collected: 08/11/22 0208    Order  Status: Completed Specimen: Blood, Venous Updated: 08/16/22 0700     Culture Blood No growth at 5 days             Patient Instructions   Discharge Diet: Full liquids with evaluation by SLP at the skilled nursing facility and advancement as tolerated  Discharge Activity: Per SNF    Follow Up Appointment:   Contact information for follow-up providers  [facility] [place]. Call.    Specialty: Gastroenterology  Why: please call to make f/u in 6 weeks.  Contact information:  [address]  [place] [address]  [phone]  Additional information:  Take [address] to [place], [place] [address] [place].  Turn right on [address] and then right again at [employer].  Destination will be on your right.               Alla German, MD Follow up.    Specialties: Nephrology, Internal Medicine  Contact information:  [address]  [place] [address]  [phone]               [facility] Follow up in 1 month(s).    Specialty: Cardiology  Contact information:  [address]  [place] [address]  [phone]             Marlaine Hind, MD Follow up in 1 week(s).    Specialty: Surgery  Contact information:  [address] [place]  [address]  [place] [address]  [phone]               [facility] Follow up in 1 week(s).    Contact information:  [address] [address]             Patsy Lager, MD .                       Contact information for after-discharge care       Discharge Placement       [facility] .    Service: Skilled Nursing  Contact information:  [address]  [place] [address]  [phone]                                    Discharge Medications:     Medication List        START taking these medications      amiodarone 200 MG tablet  Commonly known as: PACERONE  Take 1 tablet (200 mg) by mouth daily This is a maintenance  dose.     apixaban 2.5 MG  Commonly known as: ELIQUIS  Take 1 tablet (2.5 mg) by mouth every 12 (twelve) hours     b complex-vitamin c-folic acid 0.8 MG Tabs  Take 1 tablet by mouth daily     carvedilol 12.5 MG tablet  Commonly known as: COREG  Take 1 tablet (12.5 mg) by mouth every 12 (twelve) hours     * megestrol 40 MG tablet  Commonly known as: MEGACE  Take 1 tablet (40 mg) by mouth 4 (four) times daily     * megestrol acetate 400 MG/10ML Susp  Commonly known as: MEGACE  Take 10 mLs (400 mg) by mouth daily     * midodrine 10 MG tablet  Commonly known as: PROAMATINE  Take 1 tablet (10 mg) by mouth Once daily every Tuesday, Thursday and Saturday morning     * midodrine 5 MG tablet  Commonly known as: PROAMATINE  Take 1 tablet (5 mg) by mouth 2 (two) times daily with meals     pantoprazole 40 mg/20 mL Susp  Commonly known as: PROTONIX  Take 20 mLs (40 mg) by mouth every 12 (twelve) hours  Replaces: pantoprazole 40 MG tablet     * polyethylene glycol 17 g packet  Commonly known as: MIRALAX  Take 17 g by mouth daily as needed (constipation)     * polyethylene glycol 17 g packet  Commonly known as: MIRALAX  Take 17 g by mouth daily     vancomycin 25 MG/ML oral solution  Take 10 mLs (250 mg) by mouth 4 (four) times daily for 10 days           * This list has 6 medication(s) that are the same as other medications prescribed for you. Read the directions carefully, and ask your doctor or other care provider to review them with you.                CONTINUE taking these medications      Anoro Ellipta 62.5-25 MCG/ACT Aepb  Generic drug: umeclidinium-vilanterol     atorvastatin 40 MG tablet  Commonly known as: LIPITOR     cinacalcet 30 MG tablet  Commonly known as: SENSIPAR     conjugated estrogens vaginal cream  Commonly known as: PREMARIN     montelukast 10 MG tablet  Commonly known as: SINGULAIR     ondansetron 4 MG tablet  Commonly known as: Zofran  Take 1 tablet (4 mg) by mouth every 8 (eight) hours as needed for  Nausea     oxybutynin XL 5 MG 24 hr tablet  Commonly known as: DITROPAN-XL     sevelamer carbonate 800 MG tablet  Commonly known as: RENVELA            STOP taking these medications      allopurinol 100 MG tablet  Commonly known as: ZYLOPRIM     AMBIEN PO     b complex vitamins capsule     clopidogrel 75 mg tablet  Commonly known as: PLAVIX     hyoscyamine 0.125 MG tablet  Commonly known as: LEVSIN     insulin aspart 100 UNIT/ML injection  Commonly known as: NovoLOG     metoprolol tartrate 25 MG tablet  Commonly known as: LOPRESSOR     oxyCODONE 5 MG capsule  Commonly known as: OXY-IR     pantoprazole 40 MG tablet  Commonly known as: PROTONIX  Replaced by: pantoprazole 40 mg/20 mL Susp     pregabalin 50 MG capsule  Commonly known as: LYRICA     torsemide 100 MG tablet  Commonly known as: DEMADEX     Tresiba 100 UNIT/ML Soln  Generic drug: Insulin Degludec            ASK your doctor about these medications      oxyCODONE 5 MG immediate release tablet  Commonly known as: ROXICODONE  Take 0.5 tablets (2.5 mg) by mouth every 8 (eight) hours as needed for Pain  Ask about: Should I take this medication?     traMADol 50 MG tablet  Commonly known as: ULTRAM  Take 1 tablet (50 mg) by mouth every 8 (eight) hours as needed for Pain  Ask about: Should I take this medication?               Where to Get Your Medications        These medications were sent to [id number], [place] - [address]  [address] [place] [address]      Phone: [phone]   megestrol 40 MG tablet  megestrol acetate 400 MG/10ML Susp  midodrine 10 MG tablet  midodrine 5 MG tablet  polyethylene glycol 17 g packet  traMADol 50 MG tablet       You can get these medications from any pharmacy    Bring a paper prescription for each of these medications  oxyCODONE 5 MG immediate release tablet       Information about where to get these medications is not yet available    Ask your nurse or doctor about  these medications  amiodarone 200 MG tablet  apixaban 2.5 MG  b complex-vitamin c-folic acid 0.8 MG Tabs  carvedilol 12.5 MG tablet  pantoprazole 40 mg/20 mL Susp  polyethylene glycol 17 g packet  vancomycin 25 MG/ML oral solution          Time spent examining patient, discussing with patient/family regarding hospital course, chart review, reconciling medications and discharge planning: 60 minutes.    Signed,  Gwyndolyn Kaufman, MD    12:42 PM 08/18/2022

## 2022-08-18 NOTE — Progress Notes (Signed)
NEURO:  A/O X 1-2  Following commands intermittently   Q4hr Neuro checks;   Response to voice  PERRLA:     CV:  Pulses palpable;  HR:NSR; on monitor.   SBP: Goal Map > 65   Generalized nonpitting edema.    SCDs intact    Respiratory:   Room air     GI:  Diets:   Blood sugars Q 6   1Large bm  GU:  UO=Anuric     SKIN:   Skin/ incontinence care rendered. Moisture barrier cream applied.   Mepilex/Adhesive Foam Dressing applied to sacrum/BL heels  Repositioned at intervals. Kept clean, dry and comfortable.      Encouraged to call for assistance as needed. Safety maintained. Bed placed in the lowest position with call bell in reach, bed alarm on, and hourly rounding completed.     Significant event:

## 2022-08-19 ENCOUNTER — Encounter (INDEPENDENT_AMBULATORY_CARE_PROVIDER_SITE_OTHER): Payer: Self-pay

## 2022-08-20 ENCOUNTER — Emergency Department
Admission: EM | Admit: 2022-08-20 | Discharge: 2022-08-20 | Disposition: A | Discharge status: Home / Self Care | Payer: Medicare Other | Attending: Emergency Medicine | Admitting: Emergency Medicine

## 2022-08-20 ENCOUNTER — Emergency Department: Payer: No Typology Code available for payment source

## 2022-08-20 DIAGNOSIS — T829XXA Unspecified complication of cardiac and vascular prosthetic device, implant and graft, initial encounter: Secondary | ICD-10-CM

## 2022-08-20 DIAGNOSIS — T8241XA Breakdown (mechanical) of vascular dialysis catheter, initial encounter: Secondary | ICD-10-CM | POA: Insufficient documentation

## 2022-08-20 LAB — LAB USE ONLY - CBC WITH DIFFERENTIAL
Absolute Basophils: 0.05 10*3/uL (ref 0.00–0.08)
Absolute Eosinophils: 0.01 10*3/uL (ref 0.00–0.44)
Absolute Immature Granulocytes: 0.35 10*3/uL — ABNORMAL HIGH (ref 0.00–0.07)
Absolute Lymphocytes: 3.11 10*3/uL (ref 0.42–3.22)
Absolute Monocytes: 1.29 10*3/uL — ABNORMAL HIGH (ref 0.21–0.85)
Absolute Neutrophils: 10.44 10*3/uL — ABNORMAL HIGH (ref 1.10–6.33)
Absolute nRBC: 0.03 10*3/uL — ABNORMAL HIGH (ref <=0.00)
Basophils %: 0.3 %
Eosinophils %: 0.1 %
Hematocrit: 30.5 % — ABNORMAL LOW (ref 34.7–43.7)
Hemoglobin: 9.7 g/dL — ABNORMAL LOW (ref 11.4–14.8)
Immature Granulocytes %: 2.3 %
Lymphocytes %: 20.4 %
MCH: 29.3 pg (ref 25.1–33.5)
MCHC: 31.8 g/dL (ref 31.5–35.8)
MCV: 92.1 fL (ref 78.0–96.0)
MPV: 9.2 fL (ref 8.9–12.5)
Monocytes %: 8.5 %
Neutrophils %: 68.4 %
Platelet Count: 446 10*3/uL — ABNORMAL HIGH (ref 142–346)
Preliminary Absolute Neutrophil Count: 10.44 10*3/uL — ABNORMAL HIGH (ref 1.10–6.33)
RBC: 3.31 10*6/uL — ABNORMAL LOW (ref 3.90–5.10)
RDW: 15 % (ref 11–15)
WBC: 15.25 10*3/uL — ABNORMAL HIGH (ref 3.10–9.50)
nRBC %: 0.2 /100 WBC — ABNORMAL HIGH (ref <=0.0)

## 2022-08-20 LAB — BASIC METABOLIC PANEL
Anion Gap: 14 (ref 5.0–15.0)
BUN: 42 mg/dL — ABNORMAL HIGH (ref 7–21)
CO2: 23 mEq/L (ref 17–29)
Calcium: 10 mg/dL (ref 7.9–10.2)
Chloride: 100 mEq/L (ref 99–111)
Creatinine: 5.3 mg/dL — ABNORMAL HIGH (ref 0.4–1.0)
GFR: 7.5 mL/min/{1.73_m2} — ABNORMAL LOW (ref >=60.0)
Glucose: 82 mg/dL (ref 70–100)
Potassium: 5.2 mEq/L (ref 3.5–5.3)
Sodium: 137 mEq/L (ref 135–145)

## 2022-08-20 MED ORDER — PANTOPRAZOLE ORAL SUSPENSION 40 MG/20 ML UD
40.0000 mg | Freq: Once | ORAL | Status: AC
Start: 2022-08-20 — End: 2022-08-20
  Administered 2022-08-20: 40 mg via ORAL
  Filled 2022-08-20: qty 20

## 2022-08-20 MED ORDER — SODIUM POLYSTYRENE SULFONATE 15 GM/60ML PO SUSP
30.0000 g | Freq: Once | ORAL | Status: AC
Start: 2022-08-20 — End: 2022-08-20
  Administered 2022-08-20: 30 g via ORAL
  Filled 2022-08-20: qty 60
  Filled 2022-08-20: qty 120

## 2022-08-20 MED ORDER — APIXABAN 2.5 MG PO TABS
2.5000 mg | ORAL_TABLET | Freq: Once | ORAL | Status: AC
Start: 2022-08-20 — End: 2022-08-20
  Administered 2022-08-20: 2.5 mg via ORAL
  Filled 2022-08-20: qty 1

## 2022-08-20 MED ORDER — CARVEDILOL 6.25 MG PO TABS
12.5000 mg | ORAL_TABLET | Freq: Once | ORAL | Status: AC
Start: 2022-08-20 — End: 2022-08-20
  Administered 2022-08-20: 12.5 mg via ORAL
  Filled 2022-08-20: qty 2

## 2022-08-20 NOTE — ED Provider Notes (Signed)
[facility] EMERGENCY DEPARTMENT  ATTENDING PHYSICIAN HISTORY AND PHYSICAL EXAM     Patient Name: Shelby Barron, Shelby Barron  Department:[facility] EM[facility] DE[facility]  Encounter Date:  08/20/2022  Attending Physician: Marijean Heath, MD   Age: 82 y.o. female  Patient Room: Am[facility]fisher  PCP: Patsy Lager, MD           Diagnosis/Disposition:     Final diagnoses:   Complication associated with dialysis catheter       ED Disposition       ED Disposition   Discharge    Condition   --    Date/Time   Fri Aug 20, 2022  6:38 PM    Comment   [name] discharge to home/self care.    Condition at disposition: Stable                 Follow-Up Providers (if applicable)    [facility]  [address]  [place] [address]  [phone]  Go to   If symptoms worsen       New Prescriptions    No medications on file           Medical Decision Making:     Initial Differential Diagnosis:  Here with malfunctioning dialysis catheter.  No signs of infection.  Will check for any metabolic arrangement given she missed 1 session of dialysis.  Does not appear clinically fluid overloaded.    Plan:  Chest x-ray, labs  Nephrology consult    Final Impression:  Malfunctioning dialysis catheter  The patient was deemed stable for discharge. They were given strict return precautions as it relates to their presumed diagnosis, verbalized understanding of these precautions and agreed to follow up as instructed. All questions were answered prior to discharge.    ED Course as of 08/20/22 1845   Fri Aug 20, 2022   1648 Radiology -   CXR  interpreted by me with the following observations: small left pleural effusion      [KB]   1836 Spoke with nephrologist Dr. Janice Norrie and he set up appointment for patient to get her dialysis catheter replaced tomorrow at 10 AM and transport is set up.  She would then get dialysis after that.  I spoke with patient and her family member and her daughter at bedside who feel comfortable with the plan.  Nephrology  recommended dose of Kayexalate prior to discharge. [KB]      ED Course User Index  [KB] Marijean Heath, MD       Medical Decision Making  Amount and/or Complexity of Data Reviewed  Radiology: ordered.    Risk  Prescription drug management.                              History of Presenting Illness:     Nursing Triage note: Patient presents to ED via [facility] for a clogged dialysis port. Per facility the clot "just became clogged," to her right chest. Dressing is falling off port access. Per facility patient received dialysis on Thursday. Patient gets dialysis Tu/Th/Sat. Patient has a wound vac in place to stomach area. Patient is on 4L NC which is baseline. Patient has metabolic encephalopathy, very lethargic, but oriented x2 to self and place. Patient has DM and ESRD.  Chief complaint: Vascular Access Problem    Shelby Barron is a 82 y.o. female with history of COPD, prior  stroke in 2004, type 2 diabetes, RCC status post bilateral nephrectomy, [name], recently diagnosed urothelial cancer, prolonged hospitalization that started on June 28, 2022 here with dialysis catheter malfunction.  Patient was supposed to get dialysis yesterday but reportedly her dialysis catheter was not returning blood but does flush.  Otherwise she has no complaints.  She is mostly nonverbal and family is at bedside.  They states she is at her baseline.          Review of Systems:  Physical Exam:     Review of Systems   All other systems reviewed and are negative.      Positive and negative ROS per above and in HPI. All other systems reviewed and negative.     Triage Vitals: Pulse 78  BP 144/81  Resp 22  SpO2 100 %  Temp 99.2 F (37.3 C)     Physical Exam  Vitals and nursing note reviewed.   Constitutional:       General: She is not in acute distress.     Comments: Chronically ill-appearing   HENT:      Head: Normocephalic and atraumatic.      Mouth/Throat:      Mouth: Mucous membranes are  moist.      Pharynx: Oropharynx is clear.   Eyes:      Extraocular Movements: Extraocular movements intact.      Conjunctiva/sclera: Conjunctivae normal.   Cardiovascular:      Rate and Rhythm: Normal rate.      Comments: Right chest wall dialysis catheter access without surrounding redness or warmth or pain.  Pulmonary:      Effort: Pulmonary effort is normal. No respiratory distress.      Breath sounds: Normal breath sounds.   Abdominal:      General: Abdomen is flat.      Palpations: Abdomen is soft.      Comments: Wound VAC in place   Skin:     General: Skin is warm and dry.   Neurological:      Mental Status: She is alert. Mental status is at baseline.                 Interpretations, Clinical Decision Tools and Critical Care:                    Procedures:   Procedures      Attestations:     Scribe Attestation: There was no scribe involved in the care of this patient.     Documentation Notes:  Parts of this note were generated by the Epic EMR system/ Dragon speech recognition and may contain inherent errors or omissions not intended by the user. Grammatical errors, random word insertions, deletions, pronoun errors and incomplete sentences are occasional consequences of this technology due to software limitations. Not all errors are caught or corrected.  My documentation is often completed after the patient is no longer under my clinical care. In some cases, the Epic EMR may pull updated results into the above documentation which may not reflect all results or information that were available to me at the time of my medical decision making.   If there are questions or concerns about the content of this note or information contained within the body of this dictation they should be addressed directly with the author for clarification.                  Marijean Heath, MD  08/20/22 [phone]

## 2022-08-20 NOTE — ED Triage Notes (Signed)
Patient reported that she was brought to the hospital for problem with her dialysis catheter. Patient is alert and oriented x 3 (to self, situation and place). Patient denies any pain, nausea, vomiting, fever and chills. Patient on 3 L oxygen and has a wound vac on her abdomen.

## 2022-08-20 NOTE — Discharge Instructions (Signed)
Please do not eat or drink after midnight tonight

## 2022-08-20 NOTE — ED Notes (Signed)
Bed: N G  Expected date:   Expected time:   Means of arrival:   Comments:  R here

## 2022-08-20 NOTE — Provider Clarification Note (Signed)
Patient Name: Shelby Barron, Shelby Barron  Account #: [id number]   MR #: [id number]  Discharge Date: 08/18/2022    Dear Dr. Barbara Cower, ,     A debridement is documented 08/02/2022, abdomen.  Additional clarification regarding the procedure is requested.    History/Risk Factors:    Clinical Indicators:  Wound Care Note 07/29/2022    Assessment:    Wound Type and Location: Surgical Wound Mid Abdomen  Dimensions: 26 cm x 6 cm x 5.8 cm  Wound Bed: Full thickness tissue loss with beefy red granulation tissue along wound edges. Granulation tissue 60% Nongranulation tissue 15% Slough 35%  Undermining:no  Tunneling: no    Peri Wound/ Edges:Right lower abdomen wound between 6 and 9 O'clock peri wound , partial thickness skin loss. Noted decrease in slough/eschar in wound bed area measuring 4 cm x 6 cm (originally wound  from skin tear from vac drape)        Treatment: Bedside Wound Debridement Note: 08/02/2022    Procedure, including indications and risks / benefits, discussed with patient and patient's daughter, Shelby Barron. Consent obtained from [name] given patient's fluctuating mental status.     Wound vac removed. Unhealthy tissue sharply debrided from right inferior aspect of wound along with fibrinous exudate. Healthy tissue with good perfusion noted. Remainder of wound granulating well. Good hemostasis achieved.       Question: Please clarify the type of procedure performed:    Excisional debridement (the removal of necrotic, devitalized tissue or slough by means of cutting away of tissue)   Non-excisional debridement (the removal of necrotic, devitalized tissue or slough by means of flushing, brushing, washing, irrigation)  Other (please specify)   Unable to determine     Question: Please clarify the depth of the debridement:      Debridement down to and including skin/subcutaneous  Debridement down to and including fascia   Debridement down to and including tendon  Debridement down to and including muscle  Debridement down to and  including bone        Thank you,    [email or web address]      Date:  08/20/2022    Five elements required for accurate and compliant documentation of a  debridement:  Technique used (e.g., excisional, excised, cutting, brushing, jet lavage etc.)  Instrument(s) used (e.g., scalpel, curette, etc.)  Ashby Dawes of the tissue removed (e.g., necrotic, devitalized tissues, non-viable tissue, etc.)  Appearance and size of the wound (e.g., down to fresh bleeding tissue, 7cm x 10cm, etc.)  Depth of the debridement (e.g., skin, subcutaneous tissue, fascia, muscle, bone, etc.)      PROVIDER RESPONSE (Choose from list above or add free text): Excisional debridment with scalpel, down to and including skin and subcutaneous tissue

## 2022-08-21 ENCOUNTER — Inpatient Hospital Stay
Admission: EM | Admit: 2022-08-21 | Discharge: 2022-09-19 | DRG: 673 | Disposition: E | Payer: Medicare Other | Attending: Internal Medicine | Admitting: Internal Medicine

## 2022-08-21 ENCOUNTER — Emergency Department: Payer: Medicare Other

## 2022-08-21 DIAGNOSIS — I10 Essential (primary) hypertension: Secondary | ICD-10-CM

## 2022-08-21 DIAGNOSIS — Z9049 Acquired absence of other specified parts of digestive tract: Secondary | ICD-10-CM

## 2022-08-21 DIAGNOSIS — R5381 Other malaise: Secondary | ICD-10-CM | POA: Diagnosis present

## 2022-08-21 DIAGNOSIS — I69364 Other paralytic syndrome following cerebral infarction affecting left non-dominant side: Secondary | ICD-10-CM

## 2022-08-21 DIAGNOSIS — T8131XA Disruption of external operation (surgical) wound, not elsewhere classified, initial encounter: Secondary | ICD-10-CM | POA: Diagnosis not present

## 2022-08-21 DIAGNOSIS — J449 Chronic obstructive pulmonary disease, unspecified: Secondary | ICD-10-CM | POA: Diagnosis present

## 2022-08-21 DIAGNOSIS — E1140 Type 2 diabetes mellitus with diabetic neuropathy, unspecified: Secondary | ICD-10-CM | POA: Diagnosis present

## 2022-08-21 DIAGNOSIS — N186 End stage renal disease: Secondary | ICD-10-CM | POA: Diagnosis present

## 2022-08-21 DIAGNOSIS — G9341 Metabolic encephalopathy: Secondary | ICD-10-CM | POA: Diagnosis not present

## 2022-08-21 DIAGNOSIS — J690 Pneumonitis due to inhalation of food and vomit: Secondary | ICD-10-CM | POA: Diagnosis not present

## 2022-08-21 DIAGNOSIS — I700 Atherosclerosis of aorta: Secondary | ICD-10-CM | POA: Diagnosis present

## 2022-08-21 DIAGNOSIS — I5032 Chronic diastolic (congestive) heart failure: Secondary | ICD-10-CM | POA: Diagnosis present

## 2022-08-21 DIAGNOSIS — Z85528 Personal history of other malignant neoplasm of kidney: Secondary | ICD-10-CM

## 2022-08-21 DIAGNOSIS — I132 Hypertensive heart and chronic kidney disease with heart failure and with stage 5 chronic kidney disease, or end stage renal disease: Secondary | ICD-10-CM | POA: Diagnosis present

## 2022-08-21 DIAGNOSIS — Z79899 Other long term (current) drug therapy: Secondary | ICD-10-CM

## 2022-08-21 DIAGNOSIS — I959 Hypotension, unspecified: Secondary | ICD-10-CM | POA: Diagnosis not present

## 2022-08-21 DIAGNOSIS — K219 Gastro-esophageal reflux disease without esophagitis: Secondary | ICD-10-CM | POA: Diagnosis present

## 2022-08-21 DIAGNOSIS — R112 Nausea with vomiting, unspecified: Secondary | ICD-10-CM | POA: Diagnosis not present

## 2022-08-21 DIAGNOSIS — Y838 Other surgical procedures as the cause of abnormal reaction of the patient, or of later complication, without mention of misadventure at the time of the procedure: Secondary | ICD-10-CM | POA: Diagnosis present

## 2022-08-21 DIAGNOSIS — D509 Iron deficiency anemia, unspecified: Secondary | ICD-10-CM | POA: Diagnosis present

## 2022-08-21 DIAGNOSIS — I4719 Other supraventricular tachycardia: Secondary | ICD-10-CM | POA: Diagnosis not present

## 2022-08-21 DIAGNOSIS — R1310 Dysphagia, unspecified: Secondary | ICD-10-CM | POA: Diagnosis present

## 2022-08-21 DIAGNOSIS — I480 Paroxysmal atrial fibrillation: Secondary | ICD-10-CM | POA: Diagnosis present

## 2022-08-21 DIAGNOSIS — I2510 Atherosclerotic heart disease of native coronary artery without angina pectoris: Secondary | ICD-10-CM | POA: Diagnosis present

## 2022-08-21 DIAGNOSIS — E785 Hyperlipidemia, unspecified: Secondary | ICD-10-CM | POA: Diagnosis present

## 2022-08-21 DIAGNOSIS — D62 Acute posthemorrhagic anemia: Secondary | ICD-10-CM | POA: Diagnosis present

## 2022-08-21 DIAGNOSIS — R079 Chest pain, unspecified: Secondary | ICD-10-CM

## 2022-08-21 DIAGNOSIS — R7989 Other specified abnormal findings of blood chemistry: Secondary | ICD-10-CM | POA: Diagnosis not present

## 2022-08-21 DIAGNOSIS — Z992 Dependence on renal dialysis: Secondary | ICD-10-CM

## 2022-08-21 DIAGNOSIS — Y712 Prosthetic and other implants, materials and accessory cardiovascular devices associated with adverse incidents: Secondary | ICD-10-CM | POA: Diagnosis present

## 2022-08-21 DIAGNOSIS — R6510 Systemic inflammatory response syndrome (SIRS) of non-infectious origin without acute organ dysfunction: Secondary | ICD-10-CM | POA: Diagnosis present

## 2022-08-21 DIAGNOSIS — Z90710 Acquired absence of both cervix and uterus: Secondary | ICD-10-CM

## 2022-08-21 DIAGNOSIS — E1122 Type 2 diabetes mellitus with diabetic chronic kidney disease: Secondary | ICD-10-CM | POA: Diagnosis present

## 2022-08-21 DIAGNOSIS — D75839 Thrombocytosis, unspecified: Secondary | ICD-10-CM | POA: Diagnosis present

## 2022-08-21 DIAGNOSIS — Z7401 Bed confinement status: Secondary | ICD-10-CM

## 2022-08-21 DIAGNOSIS — G9340 Encephalopathy, unspecified: Secondary | ICD-10-CM

## 2022-08-21 DIAGNOSIS — L0390 Cellulitis, unspecified: Secondary | ICD-10-CM | POA: Diagnosis present

## 2022-08-21 DIAGNOSIS — E669 Obesity, unspecified: Secondary | ICD-10-CM | POA: Diagnosis present

## 2022-08-21 DIAGNOSIS — Z7901 Long term (current) use of anticoagulants: Secondary | ICD-10-CM

## 2022-08-21 DIAGNOSIS — Z794 Long term (current) use of insulin: Secondary | ICD-10-CM

## 2022-08-21 DIAGNOSIS — M109 Gout, unspecified: Secondary | ICD-10-CM | POA: Diagnosis present

## 2022-08-21 DIAGNOSIS — Z905 Acquired absence of kidney: Secondary | ICD-10-CM

## 2022-08-21 DIAGNOSIS — J9601 Acute respiratory failure with hypoxia: Secondary | ICD-10-CM | POA: Diagnosis not present

## 2022-08-21 DIAGNOSIS — Z906 Acquired absence of other parts of urinary tract: Secondary | ICD-10-CM

## 2022-08-21 DIAGNOSIS — R188 Other ascites: Secondary | ICD-10-CM | POA: Diagnosis present

## 2022-08-21 DIAGNOSIS — L98499 Non-pressure chronic ulcer of skin of other sites with unspecified severity: Secondary | ICD-10-CM | POA: Diagnosis present

## 2022-08-21 DIAGNOSIS — Z87891 Personal history of nicotine dependence: Secondary | ICD-10-CM

## 2022-08-21 DIAGNOSIS — Z8551 Personal history of malignant neoplasm of bladder: Secondary | ICD-10-CM

## 2022-08-21 DIAGNOSIS — E43 Unspecified severe protein-calorie malnutrition: Secondary | ICD-10-CM | POA: Diagnosis present

## 2022-08-21 DIAGNOSIS — Z66 Do not resuscitate: Secondary | ICD-10-CM | POA: Diagnosis not present

## 2022-08-21 DIAGNOSIS — E8770 Fluid overload, unspecified: Secondary | ICD-10-CM | POA: Diagnosis present

## 2022-08-21 DIAGNOSIS — T8241XA Breakdown (mechanical) of vascular dialysis catheter, initial encounter: Principal | ICD-10-CM | POA: Diagnosis present

## 2022-08-21 DIAGNOSIS — D631 Anemia in chronic kidney disease: Secondary | ICD-10-CM | POA: Diagnosis present

## 2022-08-21 DIAGNOSIS — T8189XA Other complications of procedures, not elsewhere classified, initial encounter: Secondary | ICD-10-CM | POA: Diagnosis present

## 2022-08-21 DIAGNOSIS — Z6830 Body mass index (BMI) 30.0-30.9, adult: Secondary | ICD-10-CM

## 2022-08-21 DIAGNOSIS — J9811 Atelectasis: Secondary | ICD-10-CM | POA: Diagnosis present

## 2022-08-21 LAB — COMPREHENSIVE METABOLIC PANEL
ALT: 13 U/L (ref 0–55)
AST (SGOT): 13 U/L (ref 5–41)
Albumin/Globulin Ratio: 0.5 — ABNORMAL LOW (ref 0.9–2.2)
Albumin: 1.6 g/dL — ABNORMAL LOW (ref 3.5–5.0)
Alkaline Phosphatase: 123 U/L — ABNORMAL HIGH (ref 37–117)
Anion Gap: 16 — ABNORMAL HIGH (ref 5.0–15.0)
BUN: 52 mg/dL — ABNORMAL HIGH (ref 7–21)
Bilirubin, Total: 0.3 mg/dL (ref 0.2–1.2)
CO2: 23 mEq/L (ref 17–29)
Calcium: 9.1 mg/dL (ref 7.9–10.2)
Chloride: 100 mEq/L (ref 99–111)
Creatinine: 6.6 mg/dL — ABNORMAL HIGH (ref 0.4–1.0)
GFR: 5.7 mL/min/{1.73_m2} — ABNORMAL LOW (ref >=60.0)
Globulin: 3.2 g/dL (ref 2.0–3.6)
Glucose: 76 mg/dL (ref 70–100)
Potassium: 4.8 mEq/L (ref 3.5–5.3)
Protein, Total: 4.8 g/dL — ABNORMAL LOW (ref 6.0–8.3)
Sodium: 139 mEq/L (ref 135–145)

## 2022-08-21 LAB — LAB USE ONLY - CBC WITH DIFFERENTIAL
Absolute Basophils: 0.04 10*3/uL (ref 0.00–0.08)
Absolute Eosinophils: 0.04 10*3/uL (ref 0.00–0.44)
Absolute Immature Granulocytes: 0.27 10*3/uL — ABNORMAL HIGH (ref 0.00–0.07)
Absolute Lymphocytes: 3.56 10*3/uL — ABNORMAL HIGH (ref 0.42–3.22)
Absolute Monocytes: 1.76 10*3/uL — ABNORMAL HIGH (ref 0.21–0.85)
Absolute Neutrophils: 10.66 10*3/uL — ABNORMAL HIGH (ref 1.10–6.33)
Absolute nRBC: 0.02 10*3/uL — ABNORMAL HIGH (ref <=0.00)
Basophils %: 0.2 %
Eosinophils %: 0.2 %
Hematocrit: 27.4 % — ABNORMAL LOW (ref 34.7–43.7)
Hemoglobin: 8.9 g/dL — ABNORMAL LOW (ref 11.4–14.8)
Immature Granulocytes %: 1.7 %
Lymphocytes %: 21.8 %
MCH: 29.4 pg (ref 25.1–33.5)
MCHC: 32.5 g/dL (ref 31.5–35.8)
MCV: 90.4 fL (ref 78.0–96.0)
MPV: 9.1 fL (ref 8.9–12.5)
Monocytes %: 10.8 %
Neutrophils %: 65.3 %
Platelet Count: 442 10*3/uL — ABNORMAL HIGH (ref 142–346)
Preliminary Absolute Neutrophil Count: 10.66 10*3/uL — ABNORMAL HIGH (ref 1.10–6.33)
RBC: 3.03 10*6/uL — ABNORMAL LOW (ref 3.90–5.10)
RDW: 16 % — ABNORMAL HIGH (ref 11–15)
WBC: 16.33 10*3/uL — ABNORMAL HIGH (ref 3.10–9.50)
nRBC %: 0.1 /100 WBC — ABNORMAL HIGH (ref <=0.0)

## 2022-08-21 LAB — PHOSPHORUS: Phosphorus: 4.3 mg/dL (ref 2.3–4.7)

## 2022-08-21 LAB — ECG 12-LEAD
Atrial Rate: 94 {beats}/min
IHS MUSE NARRATIVE AND IMPRESSION: NORMAL
P Axis: 39 degrees
P-R Interval: 160 ms
Q-T Interval: 358 ms
QRS Duration: 74 ms
QTC Calculation (Bezet): 447 ms
R Axis: -20 degrees
T Axis: 64 degrees
Ventricular Rate: 94 {beats}/min

## 2022-08-21 LAB — MAGNESIUM: Magnesium: 2 mg/dL (ref 1.6–2.6)

## 2022-08-21 MED ORDER — CINACALCET HCL 30 MG PO TABS
30.0000 mg | ORAL_TABLET | Freq: Every evening | ORAL | Status: DC
Start: 2022-08-21 — End: 2022-08-31
  Administered 2022-08-21 – 2022-08-26 (×6): 30 mg via ORAL
  Filled 2022-08-21 (×9): qty 1

## 2022-08-21 MED ORDER — AMIODARONE HCL 200 MG PO TABS
200.0000 mg | ORAL_TABLET | Freq: Every day | ORAL | Status: DC
Start: 2022-08-21 — End: 2022-08-28
  Administered 2022-08-21 – 2022-08-26 (×6): 200 mg via ORAL
  Filled 2022-08-21 (×9): qty 1

## 2022-08-21 MED ORDER — MELATONIN 3 MG PO TABS
3.0000 mg | ORAL_TABLET | Freq: Every evening | ORAL | Status: DC | PRN
Start: 2022-08-21 — End: 2022-09-03

## 2022-08-21 MED ORDER — DEXTROSE 10 % IV BOLUS
12.5000 g | INTRAVENOUS | Status: DC | PRN
Start: 2022-08-21 — End: 2022-09-03
  Administered 2022-08-22: 125 mL via INTRAVENOUS
  Filled 2022-08-21: qty 250

## 2022-08-21 MED ORDER — OXYCODONE HCL 5 MG PO TABS
2.5000 mg | ORAL_TABLET | Freq: Four times a day (QID) | ORAL | Status: DC | PRN
Start: 2022-08-21 — End: 2022-08-21
  Administered 2022-08-21: 2.5 mg via ORAL
  Filled 2022-08-21 (×2): qty 1

## 2022-08-21 MED ORDER — PANTOPRAZOLE SODIUM 40 MG PO TBEC
40.0000 mg | DELAYED_RELEASE_TABLET | Freq: Every day | ORAL | Status: DC
Start: 2022-08-22 — End: 2022-09-03
  Administered 2022-08-22 – 2022-09-03 (×7): 40 mg via ORAL
  Filled 2022-08-21 (×10): qty 1

## 2022-08-21 MED ORDER — POLYETHYLENE GLYCOL 3350 17 G PO PACK
17.0000 g | PACK | Freq: Every day | ORAL | Status: DC | PRN
Start: 2022-08-21 — End: 2022-09-03
  Administered 2022-08-22 – 2022-08-25 (×2): 17 g via ORAL
  Filled 2022-08-21 (×2): qty 1

## 2022-08-21 MED ORDER — NALOXONE HCL 0.4 MG/ML IJ SOLN (WRAP)
0.2000 mg | INTRAMUSCULAR | Status: DC | PRN
Start: 2022-08-21 — End: 2022-09-03

## 2022-08-21 MED ORDER — ACETAMINOPHEN 325 MG PO TABS
650.0000 mg | ORAL_TABLET | ORAL | Status: DC | PRN
Start: 2022-08-21 — End: 2022-09-03

## 2022-08-21 MED ORDER — CARBOXYMETHYLCELLULOSE SOD PF 0.5 % OP SOLN
1.0000 [drp] | Freq: Three times a day (TID) | OPHTHALMIC | Status: DC | PRN
Start: 2022-08-21 — End: 2022-09-03

## 2022-08-21 MED ORDER — SALINE SPRAY 0.65 % NA SOLN
2.0000 | NASAL | Status: DC | PRN
Start: 2022-08-21 — End: 2022-09-03

## 2022-08-21 MED ORDER — SEVELAMER CARBONATE 800 MG PO TABS
800.0000 mg | ORAL_TABLET | Freq: Three times a day (TID) | ORAL | Status: DC
Start: 2022-08-21 — End: 2022-08-29
  Administered 2022-08-21 – 2022-08-26 (×13): 800 mg via ORAL
  Filled 2022-08-21 (×18): qty 1

## 2022-08-21 MED ORDER — MIDODRINE HCL 5 MG PO TABS
5.0000 mg | ORAL_TABLET | Freq: Two times a day (BID) | ORAL | Status: DC
Start: 2022-08-21 — End: 2022-08-31
  Administered 2022-08-21 – 2022-08-26 (×6): 5 mg via ORAL
  Filled 2022-08-21 (×11): qty 1

## 2022-08-21 MED ORDER — CARVEDILOL 12.5 MG PO TABS
12.5000 mg | ORAL_TABLET | Freq: Two times a day (BID) | ORAL | Status: DC
Start: 2022-08-21 — End: 2022-08-28
  Administered 2022-08-21 – 2022-08-26 (×10): 12.5 mg via ORAL
  Filled 2022-08-21 (×13): qty 1

## 2022-08-21 MED ORDER — BENZOCAINE-MENTHOL MT LOZG (WRAP)
1.0000 | LOZENGE | OROMUCOSAL | Status: DC | PRN
Start: 2022-08-21 — End: 2022-09-03

## 2022-08-21 MED ORDER — ALTEPLASE 2 MG IJ SOLR
2.0000 mg | Freq: Once | INTRAMUSCULAR | Status: AC
Start: 2022-08-21 — End: 2022-08-21
  Administered 2022-08-21: 2 mg
  Filled 2022-08-21: qty 2

## 2022-08-21 MED ORDER — APIXABAN 2.5 MG PO TABS
2.5000 mg | ORAL_TABLET | Freq: Two times a day (BID) | ORAL | Status: DC
Start: 2022-08-21 — End: 2022-08-23
  Administered 2022-08-21 – 2022-08-22 (×3): 2.5 mg via ORAL
  Filled 2022-08-21 (×5): qty 1

## 2022-08-21 MED ORDER — GLUCAGON 1 MG IJ SOLR (WRAP)
1.0000 mg | INTRAMUSCULAR | Status: DC | PRN
Start: 2022-08-21 — End: 2022-09-03

## 2022-08-21 MED ORDER — OXYBUTYNIN CHLORIDE ER 5 MG PO TB24
5.0000 mg | ORAL_TABLET | Freq: Every day | ORAL | Status: DC
Start: 2022-08-22 — End: 2022-09-03
  Administered 2022-08-22 – 2022-09-02 (×6): 5 mg via ORAL
  Filled 2022-08-21 (×8): qty 1

## 2022-08-21 MED ORDER — DEXTROSE 50 % IV SOLN
12.5000 g | INTRAVENOUS | Status: DC | PRN
Start: 2022-08-21 — End: 2022-09-03

## 2022-08-21 MED ORDER — ATORVASTATIN CALCIUM 20 MG PO TABS
40.0000 mg | ORAL_TABLET | Freq: Every day | ORAL | Status: DC
Start: 2022-08-21 — End: 2022-08-31
  Administered 2022-08-21 – 2022-08-26 (×6): 40 mg via ORAL
  Filled 2022-08-21 (×9): qty 2

## 2022-08-21 MED ORDER — OXYCODONE HCL 5 MG PO TABS
2.5000 mg | ORAL_TABLET | Freq: Four times a day (QID) | ORAL | Status: DC | PRN
Start: 2022-08-21 — End: 2022-08-23
  Administered 2022-08-22 – 2022-08-23 (×4): 2.5 mg via ORAL
  Filled 2022-08-21 (×4): qty 1

## 2022-08-21 MED ORDER — UMECLIDINIUM-VILANTEROL 62.5-25 MCG/ACT IN AEPB
1.0000 | INHALATION_SPRAY | Freq: Every morning | RESPIRATORY_TRACT | Status: DC
Start: 2022-08-22 — End: 2022-09-03
  Administered 2022-08-22 – 2022-08-31 (×6): 1 via RESPIRATORY_TRACT
  Filled 2022-08-21 (×2): qty 7

## 2022-08-21 MED ORDER — MONTELUKAST SODIUM 10 MG PO TABS
10.0000 mg | ORAL_TABLET | Freq: Every evening | ORAL | Status: DC
Start: 2022-08-21 — End: 2022-08-31
  Administered 2022-08-21 – 2022-08-26 (×6): 10 mg via ORAL
  Filled 2022-08-21 (×7): qty 1

## 2022-08-21 MED ORDER — GLUCOSE 40 % PO GEL (WRAP)
15.0000 g | ORAL | Status: DC | PRN
Start: 2022-08-21 — End: 2022-09-03

## 2022-08-21 MED ORDER — NEPHRO-VITE 0.8 MG PO TABS
1.0000 | ORAL_TABLET | Freq: Every day | ORAL | Status: DC
Start: 2022-08-21 — End: 2022-09-03
  Administered 2022-08-21 – 2022-09-03 (×9): 1 via ORAL
  Filled 2022-08-21 (×12): qty 1

## 2022-08-21 MED ORDER — BENZONATATE 100 MG PO CAPS
100.0000 mg | ORAL_CAPSULE | Freq: Three times a day (TID) | ORAL | Status: DC | PRN
Start: 2022-08-21 — End: 2022-09-03

## 2022-08-21 NOTE — Plan of Care (Signed)
Problem: Moderate/High Fall Risk Score >5  Goal: Patient will remain free of falls  Outcome: Progressing  Flowsheets (Taken 08/21/2022 1720)  High (Greater than 13):   LOW-Fall Interventions Appropriate for Low Fall Risk   LOW-Anticoagulation education for injury risk   MOD-Use of chair-pad alarm when appropriate   MOD-Consider a move closer to [employer]   MOD-Remain with patient during toileting   MOD-Use of assistive devices -Bedside Commode if appropriate   MOD-Re-orient confused patients   MOD-Utilize diversion activities   MOD-Perform dangle, stand, walk (DSW) prior to mobilization   MOD-Request PT/OT consult order for patients with gait/mobility impairment   MOD-Include family in multidisciplinary POC discussions   HIGH-Visual cue at entrance to patient's room   MOD-Place Fall Risk level on whiteboard in room   HIGH-Bed alarm on at all times while patient in bed   HIGH-Utilize chair pad alarm for patient while in the chair   HIGH-Apply yellow "Fall Risk" arm band   HIGH-Pharmacy to initiate evaluation and intervention per protocol   HIGH-Initiate use of floor mats as appropriate   HIGH-Consider use of low bed     Problem: Compromised Sensory Perception  Goal: Sensory Perception Interventions  Outcome: Progressing  Flowsheets (Taken 08/21/2022 1832)  Sensory Perception Interventions: Offload heels, Pad bony prominences, Reposition q 2hrs/turn Clock, Q2 hour skin assessment under devices if present     Problem: Compromised Moisture  Goal: Moisture level Interventions  Outcome: Progressing  Flowsheets (Taken 08/21/2022 1832)  Moisture level Interventions: Moisture wicking products, Moisture barrier cream     Problem: Compromised Activity/Mobility  Goal: Activity/Mobility Interventions  Outcome: Progressing  Flowsheets (Taken 08/21/2022 1832)  Activity/Mobility Interventions: Pad bony prominences, TAP Seated positioning system when OOB, Promote PMP, Reposition q 2 hrs / turn clock, Offload heels     Problem:  Compromised Nutrition  Goal: Nutrition Interventions  Outcome: Progressing  Flowsheets (Taken 08/21/2022 1832)  Nutrition Interventions: Discuss nutrition at Rounds, I&Os, Document % meal eaten, Daily weights     Problem: Compromised Friction/Shear  Goal: Friction and Shear Interventions  Outcome: Progressing  Flowsheets (Taken 08/21/2022 1832)  Friction and Shear Interventions: Pad bony prominences, Off load heels, HOB 30 degrees or less unless contraindicated, Consider: TAP seated positioning, Heel foams     Problem: Renal Instability  Goal: Fluid and electrolyte balance are achieved/maintained  Outcome: Progressing  Flowsheets (Taken 08/21/2022 1832)  Fluid and electrolyte balance are achieved/maintained:   Monitor/assess lab values and report abnormal values   Assess and reassess fluid and electrolyte status   Observe for cardiac arrhythmias   Follow fluid restrictions/IV/PO parameters   Monitor for muscle weakness  Goal: Nutritional intake is adequate  Outcome: Progressing  Flowsheets (Taken 08/21/2022 1832)  Nutritional intake is adequate:   Consult/collaborate with Clinical Nutritionist   Consult/collaborate with Speech Therapy (swallow evaluations)  Goal: Perineal skin integrity is maintained or improved and remains free from infection  Outcome: Progressing  Note: Lack of both kidney and bladder, anuric      Problem: Patient Receiving Advanced Renal Therapies  Goal: Therapy access site remains intact  Outcome: Progressing  Flowsheets (Taken 08/21/2022 1832)  Therapy access site remains intact:   Assess therapy access site   Change therapy access site dressing as needed     Problem: Impaired Mobility  Goal: Mobility/Activity is maintained at optimal level for patient  Outcome: Progressing  Flowsheets (Taken 08/21/2022 1832)  Mobility/activity is maintained at optimal level for patient:   Encourage independent activity per ability  Consult/collaborate with Physical Therapy and/or Occupational Therapy     Problem:  Peripheral Neurovascular Impairment  Goal: Extremity color, movement, sensation are maintained or improved  Outcome: Progressing  Flowsheets (Taken 08/21/2022 1832)  Extremity color, movement, sensation are maintained or improved:   Assess and monitor application of corrective devices (cast, brace, splint), check skin integrity   Assess extremity for proper alignment     Problem: Compromised skin integrity  Goal: Skin integrity is maintained or improved  Outcome: Progressing  Flowsheets (Taken 08/21/2022 1832)  Skin integrity is maintained or improved:   Assess Braden Scale every shift   Turn or reposition patient every 2 hours or as needed unless able to reposition self   Increase activity as tolerated/progressive mobility   Relieve pressure to bony prominences   Avoid shearing   Keep skin clean and dry   Collaborate with Wound, Ostomy, and Continence Nurse   Monitor patient's hygiene practices   Encourage use of lotion/moisturizer on skin   Keep head of bed 30 degrees or less (unless contraindicated)     Problem: Nutrition  Goal: Nutritional intake is adequate  Outcome: Progressing  Flowsheets (Taken 08/21/2022 1832)  Nutritional intake is adequate:   Consult/collaborate with Clinical Nutritionist   Consult/collaborate with Speech Therapy (swallow evaluations)

## 2022-08-21 NOTE — ED to IP RN Note (Signed)
[facility] EMERGENCY DEPARTMENT  ED NURSING NOTE FOR THE RECEIVING INPATIENT NURSE   ED NURSE Enid Cutter [phone]   ED CHARGE RN Marchelle Folks [phone]   ADMISSION INFORMATION   Shelby Barron is a 82 y.o. female admitted with an ED diagnosis of:    1. Hemodialysis catheter dysfunction, initial encounter    2. ESRD (end stage renal disease)    3. Dependence on hemodialysis    4. Encephalopathy    5. Type 2 diabetes mellitus with chronic kidney disease on chronic dialysis, with long-term current use of insulin    6. Chronic diastolic congestive heart failure    7. Benign essential hypertension    8. Chronic obstructive pulmonary disease, unspecified COPD type         Isolation: None   Allergies: Metrizamide, Rosuvastatin, Iodinated contrast media, Pioglitazone, and Sulfa antibiotics   Holding Orders confirmed? No   Belongings Documented? Yes   Home medications sent to pharmacy confirmed? N/A   NURSING CARE   Patient Comes From:   Mental Status: Home/Family Care  alert   ADL: Dependent with ADLs   Ambulation: Bedbound for 8 weeks per daughter   Pertinent Information  and Safety Concerns:     Broset Violence Risk Level: Moderate Hx of stroke, wound vac for abdominal wound, Cathflo needs to be administered by Dialysis RN on dialysis catheter     CT / NIH   CT Head ordered on this patient?  No   NIH/Dysphagia assessment done prior to admission? N/A   VITAL SIGNS (at the time of this note)      Vitals:    08/21/22 1500   BP: 124/63   Pulse: 95   Resp: 21   Temp: 98 F (36.7 C)   SpO2: 98%     Pain Score: 0-No pain (08/21/22 1426)  PAINAD Score: 0 (08/21/22 1426)

## 2022-08-21 NOTE — Progress Notes (Signed)
Admission Note-  Patient admitted to CDU room #  323-2 at 1720. Hand off report received from Epic.    Patient oriented to room, bed in lowest position, bed alarm activated, call bell within reach.   Patient informed of visitor policy.  Belongings doc'd in epic.       Skin Assessment  Two nurse skin assessment performed with: Jill Alexanders    Per Dr Betsy Pries do not change abd wound vac dressing.  Pictures taken and daughter also expressed refusal of dressing change.  Per daughter changed Thursday by [facility] nursing.  See photos below                              Braden score <15. Yes    Actions taken: wound consult entered, Optifoam placed, pressure relief, nutrition consult entered     Bony Prominences: Check appropriate box; if wound is present enter wound assessment in LDA     Occiput:                 [x] WNL  []  Wound present  Face:                     [x] WNL  []  Wound present  Ears:                      [] WNL  []  Wound present  Spine:                    [x] WNL  []  Wound present  Shoulders:             [x] WNL  []  Wound present  Elbows:                  [x] WNL  []  Wound present  Sacrum/coccyx:     [] WNL  []  Wound present  Ischial Tuberosity:  [] WNL  [x]  Wound present  Trochanter/Hip:      [x] WNL  []  Wound present  Knees:                   [x] WNL  []  Wound present  Ankles:                   [x] WNL  []  Wound present  Heels:                    [x] WNL  []  Wound present         Device related: []  Device name:         LDA completed if wound present: Y  [employer] (if necessary): Y     Other skin related issues, ie tears, rash, etc, document in Integumentary flowsheet

## 2022-08-21 NOTE — ED Triage Notes (Signed)
MMT from [facility]. ESRD, has not had dialysis since Tuesday. Sent by Dr. Janice Norrie for blood work and possible emergent dialysis if unable to wait till Monday,

## 2022-08-21 NOTE — H&P (Addendum)
[facility]      Patient: Shelby Barron  Date: 08/21/2022   DOB: [birth year]  Admission Date: 08/21/2022   MRN: [id number]  Attending: [name], DO       Chief Complaint   Patient presents with    Vascular Access Problem      History Gathered From: Self    HISTORY AND PHYSICAL     MARIANNA CID is a 82 y.o. female with a PMHx of [name], RCC S/P Bilateral Nephrectomy, AF on Eliquis, Urothelial CA S/P Cystectomy c/b Small Bowel Enterotomy S/P Small Bowel Anastomosis+Abdominal Closure+Wound Vac, Chronic Diastolic CHF, DM Type II, HTN, HLD, Hx. CVA (residual LUE flaccid paralysis), AOCD, and Gout who presented with Clogged HD Catheter, has not received HD since Tuesday.    Patient recently discharged to SNF after extensive complicated admission at [facility]. Her HD catheter has been clogged and non-functioning for the past 4 days, thus has not received dialysis since then. Nephrology evaluated her as outpatient, gave kayexalate and tried to manage as outpatient, however was unsuccessful so with worsening labs she was sent to the hospital for management. In the ED, VSS, labs WBC 16, Hgb 8.9, eGFR 5.7. IR and Nephrology consulted. Patient tracking with eyes, but nonverbal and not following commands on exam, daughter at bedside reports patient is not responding because she is upset about being back in the hospital, she has a caretaker named Jerrye Beavers who is the only person the patient will speak to and take medication from.    SH: Denies tobacco, illicit drug, or ETOH    FH: Non-contributory except as documented below    Medical History[1]    Past Surgical History:   Procedure Laterality Date    APPENDECTOMY (OPEN)      CLOSURE, ENTEROTOMY, SMALL INTESTINE  06/28/2022    Procedure: CLOSURE, ENTEROTOMY, SMALL INTESTINE;  Surgeon: Marlaine Hind, MD;  Location: [facility];  Service: General;;    CYSTECTOMY, ILEOCONDUIT N/A 06/28/2022    Procedure: CYSTECTOMY, RADICAL;  Surgeon: Neldon Newport, MD;   Location: [facility];  Service: Urology;  Laterality: N/A;    DRAIN (OTHER) N/A 05/31/2022    Procedure: DRAIN (OTHER);  Surgeon: Hope Pigeon, MD;  [facility]: [facility];  Service: Interventional Radiology;  Laterality: N/A;    EXPLORATORY LAPAROTOMY N/A 06/30/2022    Procedure: SMALL BOWEL ANASTOMOSIS, CLOSURE OF MESENTERIC DEFECT, CLOSURE OF ABDOMINAL WALL;  Surgeon: Marlaine Hind, MD;  Location: [facility];  Service: General;  Laterality: N/A;  **[id number]/ MD AVAIL [id number] OR AFTER 1600**    EXPLORATORY LAPAROTOMY, RESECTION SMALL BOWEL N/A 06/28/2022    Procedure: RESECTION SMALL BOWEL;  Surgeon: Marlaine Hind, MD;  [facility]: [facility];  Service: General;  Laterality: N/A;    HERNIA REPAIR      HYSTERECTOMY      LAPAROTOMY, NEPHRECTOMY Bilateral 06/28/2022    Procedure: BILATERAL OPEN RADICAL NEPHRECTOMY AND LYSIS OF ASHESION;  Surgeon: Neldon Newport, MD;  [facility]: [facility];  Service: Urology;  Laterality: Bilateral;    PARTIAL NEPHRECTOMY      tumor removed from kidney  cJanuary 2015    TUNNELED CATH PLACEMENT (PERMCATH) N/A 06/01/2022    Procedure: TUNNELED CATH PLACEMENT;  Surgeon: Suszanne Finch, MD;  [facility];  Service: Interventional Radiology;  Laterality: N/A;    TUNNELED CATH PLACEMENT (PERMCATH) Right 07/14/2022    Procedure: TUNNELED CATH PLACEMENT;  Surgeon: Hope Pigeon, MD;  [facility];  Service: Interventional Radiology;  Laterality: Right;  right IJ tunneled HD catheter placement    TUNNELED CATH REMOVAL (PERMCATH) Right 07/14/2022    Procedure: TUNNELED CATH REMOVAL;  Surgeon: Hope Pigeon, MD;  [facility]: [facility];  Service: Interventional Radiology;  Laterality: Right;  right neck/chest tunneled HD catheter removal    TURBT      WOUND VAC APPLICATION N/A 06/28/2022    Procedure: Tawni Pummel WOUND VAC APPLICATION;  Surgeon: Marlaine Hind, MD;  [facility]: [facility];  Service: General;  Laterality: N/A;    WOUND VAC APPLICATION N/A 06/30/2022     Procedure: WOUND VAC APPLICATION;  Surgeon: Marlaine Hind, MD;  [facility]: [facility];  Service: General;  Laterality: N/A;       Prior to Admission medications    Medication Sig Start Date End Date Taking? Authorizing Provider   acetaminophen (TYLENOL) 500 MG tablet Take 2 tablets (1,000 mg) by mouth 3 (three) times daily   Yes [provider]   amiodarone (PACERONE) 200 MG tablet Take 1 tablet (200 mg) by mouth daily This is a maintenance dose. 08/05/22  Yes Lennox Pippins, MD   apixaban Everlene Balls) 2.5 MG Take 1 tablet (2.5 mg) by mouth every 12 (twelve) hours 08/05/22  Yes Dar, Keenan Bachelor, MD   atorvastatin (LIPITOR) 40 MG tablet Take 1 tablet (40 mg) by mouth daily   Yes [provider]   b complex-vitamin c-folic acid (NEPHRO-VITE) 0.8 MG Tab Take 1 tablet by mouth daily 08/08/22  Yes Dar, Keenan Bachelor, MD   carvedilol (COREG) 12.5 MG tablet Take 1 tablet (12.5 mg) by mouth every 12 (twelve) hours 08/05/22  Yes Lennox Pippins, MD   cinacalcet (SENSIPAR) 30 MG tablet Take 1 tablet (30 mg) by mouth every evening   Yes [provider]   conjugated estrogens (PREMARIN) vaginal cream Place vaginally every other day At bedtime   Yes [provider]   megestrol (MEGACE) 40 MG tablet Take 1 tablet (40 mg) by mouth 4 (four) times daily 08/13/22  Yes Donzetta Sprung, MD   megestrol acetate (MEGACE) 400 MG/10ML Suspension Take 10 mLs (400 mg) by mouth daily 08/17/22  Yes Donzetta Sprung, MD   midodrine (PROAMATINE) 10 MG tablet Take 1 tablet (10 mg) by mouth Once daily every Tuesday, Thursday and Saturday morning  Patient taking differently: Take 1 tablet (10 mg) by mouth Once daily every Tuesday, Thursday and Saturday morning Hold for SBP > 140 08/14/22  Yes Donzetta Sprung, MD   midodrine (PROAMATINE) 5 MG tablet Take 1 tablet (5 mg) by mouth 2 (two) times daily with meals 08/16/22  Yes Donzetta Sprung, MD   montelukast (SINGULAIR) 10 MG tablet Take 1 tablet (10 mg) by mouth nightly   Yes [provider]   ondansetron (Zofran) 4 MG tablet Take 1 tablet (4 mg) by mouth every 8 (eight) hours as needed for Nausea 06/04/22  Yes Yousuf, Munib A, MD   oxybutynin XL (DITROPAN-XL) 5 MG 24 hr tablet Take 1 tablet (5 mg) by mouth daily   Yes [provider]   pantoprazole (PROTONIX) 20 MG tablet Take 1 tablet (20 mg) by mouth every 12 (twelve) hours   Yes [provider]   polyethylene glycol (MIRALAX) 17 g packet Take 17 g by mouth daily as needed (constipation) 08/10/22  Yes Yousuf, Munib A, MD   polyethylene glycol (MIRALAX) 17 g packet Take 17 g by mouth daily 08/13/22  Yes Donzetta Sprung, MD   sevelamer carbonate (  RENVELA) 800 MG tablet Take 1 tablet (800 mg) by mouth 3 (three) times daily with meals   Yes [provider]   traMADol (ULTRAM) 50 MG tablet Take 1 tablet (50 mg) by mouth every 8 (eight) hours as needed for Pain   Yes [provider]   umeclidinium-vilanterol (Anoro Ellipta) 62.5-25 MCG/ACT Aerosol Pwdr, Breath Activated Inhale 1 puff into the lungs daily   Yes [provider]   pantoprazole (PROTONIX) 40 mg/20 mL Suspension Take 20 mLs (40 mg) by mouth every 12 (twelve) hours 08/05/22 08/21/22  Lennox Pippins, MD   vancomycin 25 MG/ML oral solution Take 10 mLs (250 mg) by mouth 4 (four) times daily for 10 days 08/18/22 08/21/22  Gwyndolyn Kaufman, MD       Allergies[2]    Family History   Problem Relation Age of Onset    Cancer Mother     Heart failure Father     Cancer Maternal Aunt     Diabetes Maternal Aunt     Cancer Paternal Aunt        Social History[3]    REVIEW OF SYSTEMS     Ten point review of systems negative or as per HPI and below endorsements.    PHYSICAL EXAM     Vital Signs (most recent): BP 144/69   Pulse 95   Temp 98 F (36.7 C) (Oral)   Resp 20   SpO2 97%   Constitutional: No apparent distress. Nonverbal, not following commands, but will track with eyes  HEENT: NC/AT, no scleral icterus or conjunctival pallor, no nasal discharge,  MMM  Neck: trachea midline, no masses  Cardiovascular: RRR, normal S1 S2, no murmurs  Respiratory: Normal rate. No retractions or increased work of breathing. Clear to auscultation bilaterally  Gastrointestinal: non-distended, soft, non-tender, no rebound or guarding, no hepatosplenomegaly +abdominal wound vac  Genitourinary: no suprapubic or costovertebral angle tenderness  Musculoskeletal: No clubbing, edema, or cyanosis. Capillary refill normal  Skin: no rashes, jaundice or other lesions  Neurologic: unable to assess  Psychiatric: nonverbal    LABS & IMAGING     Recent Results (from the past 24 hour(s))   Basic Metabolic Panel    Collection Time: 08/20/22  4:40 PM   Result Value Ref Range    Glucose 82 70 - 100 mg/dL    BUN 42 (H) 7 - 21 mg/dL    Creatinine 5.3 (H) 0.4 - 1.0 mg/dL    Calcium 08.6 7.9 - 57.8 mg/dL    Sodium 469 629 - 528 mEq/L    Potassium 5.2 3.5 - 5.3 mEq/L    Chloride 100 99 - 111 mEq/L    CO2 23 17 - 29 mEq/L    Anion Gap 14.0 5.0 - 15.0    GFR 7.5 (L) >=60.0 mL/min/1.73 m2   CBC with Differential (Component)    Collection Time: 08/20/22  4:40 PM   Result Value Ref Range    WBC 15.25 (H) 3.10 - 9.50 x10 3/uL    Hemoglobin 9.7 (L) 11.4 - 14.8 g/dL    Hematocrit 41.3 (L) 34.7 - 43.7 %    Platelet Count 446 (H) 142 - 346 x10 3/uL    MPV 9.2 8.9 - 12.5 fL    RBC 3.31 (L) 3.90 - 5.10 x10 6/uL    MCV 92.1 78.0 - 96.0 fL    MCH 29.3 25.1 - 33.5 pg    MCHC 31.8 31.5 - 35.8 g/dL    RDW 15 11 -  15 %    nRBC % 0.2 (H) <=0.0 /100 WBC    Absolute nRBC 0.03 (H) <=0.00 x10 3/uL    Preliminary Absolute Neutrophil Count 10.44 (H) 1.10 - 6.33 x10 3/uL    Neutrophils % 68.4 Not Established %    Lymphocytes % 20.4 Not Established %    Monocytes % 8.5 Not Established %    Eosinophils % 0.1 Not Established %    Basophils % 0.3 Not Established %    Immature Granulocytes % 2.3 Not Established %    Absolute Neutrophils 10.44 (H) 1.10 - 6.33 x10 3/uL    Absolute Lymphocytes 3.11 0.42 - 3.22 x10 3/uL    Absolute  Monocytes 1.29 (H) 0.21 - 0.85 x10 3/uL    Absolute Eosinophils 0.01 0.00 - 0.44 x10 3/uL    Absolute Basophils 0.05 0.00 - 0.08 x10 3/uL    Absolute Immature Granulocytes 0.35 (H) 0.00 - 0.07 x10 3/uL   Comprehensive Metabolic Panel    Collection Time: 08/21/22  2:24 PM   Result Value Ref Range    Glucose 76 70 - 100 mg/dL    BUN 52 (H) 7 - 21 mg/dL    Creatinine 6.6 (H) 0.4 - 1.0 mg/dL    Sodium 540 981 - 191 mEq/L    Potassium 4.8 3.5 - 5.3 mEq/L    Chloride 100 99 - 111 mEq/L    CO2 23 17 - 29 mEq/L    Calcium 9.1 7.9 - 10.2 mg/dL    Anion Gap 47.8 (H) 5.0 - 15.0    GFR 5.7 (L) >=60.0 mL/min/1.73 m2    AST (SGOT) 13 5 - 41 U/L    ALT 13 0 - 55 U/L    Alkaline Phosphatase 123 (H) 37 - 117 U/L    Albumin 1.6 (L) 3.5 - 5.0 g/dL    Protein, Total 4.8 (L) 6.0 - 8.3 g/dL    Globulin 3.2 2.0 - 3.6 g/dL    Albumin/Globulin Ratio 0.5 (L) 0.9 - 2.2    Bilirubin, Total 0.3 0.2 - 1.2 mg/dL   Magnesium    Collection Time: 08/21/22  2:24 PM   Result Value Ref Range    Magnesium 2.0 1.6 - 2.6 mg/dL   Phosphorus    Collection Time: 08/21/22  2:24 PM   Result Value Ref Range    Phosphorus 4.3 2.3 - 4.7 mg/dL   CBC with Differential (Component)    Collection Time: 08/21/22  2:24 PM   Result Value Ref Range    WBC 16.33 (H) 3.10 - 9.50 x10 3/uL    Hemoglobin 8.9 (L) 11.4 - 14.8 g/dL    Hematocrit 29.5 (L) 34.7 - 43.7 %    Platelet Count 442 (H) 142 - 346 x10 3/uL    MPV 9.1 8.9 - 12.5 fL    RBC 3.03 (L) 3.90 - 5.10 x10 6/uL    MCV 90.4 78.0 - 96.0 fL    MCH 29.4 25.1 - 33.5 pg    MCHC 32.5 31.5 - 35.8 g/dL    RDW 16 (H) 11 - 15 %    nRBC % 0.1 (H) <=0.0 /100 WBC    Absolute nRBC 0.02 (H) <=0.00 x10 3/uL    Preliminary Absolute Neutrophil Count 10.66 (H) 1.10 - 6.33 x10 3/uL    Neutrophils % 65.3 Not Established %    Lymphocytes % 21.8 Not Established %    Monocytes % 10.8 Not Established %    Eosinophils % 0.2 Not Established %  Basophils % 0.2 Not Established %    Immature Granulocytes % 1.7 Not Established %    Absolute  Neutrophils 10.66 (H) 1.10 - 6.33 x10 3/uL    Absolute Lymphocytes 3.56 (H) 0.42 - 3.22 x10 3/uL    Absolute Monocytes 1.76 (H) 0.21 - 0.85 x10 3/uL    Absolute Eosinophils 0.04 0.00 - 0.44 x10 3/uL    Absolute Basophils 0.04 0.00 - 0.08 x10 3/uL    Absolute Immature Granulocytes 0.27 (H) 0.00 - 0.07 x10 3/uL   ECG 12 lead    Collection Time: 08/21/22  2:38 PM   Result Value Ref Range    Ventricular Rate 94 BPM    Atrial Rate 94 BPM    P-R Interval 160 ms    QRS Duration 74 ms    Q-T Interval 358 ms    QTC Calculation (Bezet) 447 ms    P Axis 39 degrees    R Axis -20 degrees    T Axis 64 degrees    IHS MUSE NARRATIVE AND IMPRESSION       NORMAL SINUS RHYTHM  INFERIOR MYOCARDIAL INFARCTION (CITED ON OR BEFORE 26-May-2022)  ANTEROLATERAL MYOCARDIAL INFARCTION (CITED ON OR BEFORE 23-Jun-2022)  ABNORMAL ECG  WHEN COMPARED WITH ECG OF 11-Aug-2022 03:49,  QUESTIONABLE CHANGE IN INITIAL FORCES OF LATERAL LEADS  QT HAS SHORTENED         MICROBIOLOGY:  Blood Culture: not done  Urine Culture: not done  Antibiotics Started: none    IMAGING (xray images personally reviewed and concur with radiologist unless otherwise stated below):  XR Chest  AP Portable   Final Result      No significant change mild pulmonary vascular congestion and mild basal   atelectasis. Hiatal hernia.      Clide Cliff, MD   08/21/2022 2:35 PM          CARDIAC:  EKG Interpretation (on my review 3):  sinus rhythm    Markers:        EMERGENCY DEPARTMENT COURSE:  Orders Placed This Encounter   Procedures    XR Chest  AP Portable    CBC with Differential (Order)    Comprehensive Metabolic Panel    Magnesium    Phosphorus    CBC with Differential (Component)    Cardiac monitoring (ED Only)    Telemetry Monitoring - Long Term    ED Unit Sec Comm Order    ECG 12 lead    Saline lock IV    Adult Admit to Observation       ASSESSMENT & PLAN     LASHANA SPANG is a 82 y.o. female admitted under OBSERVATION with:    1. Malfunctioning HD Catheter  2. [name], Hx.  RCC S/P Bilateral Nephrectomy  Labs stable  IR [facility]) and Nephrology ([place]) consulting- S/P Cath-Flo x2, will attempt HD tonight, plan for IR exchange on Monday unless HD needed more urgently over weekend, contact IR to place groin quinton  Continue home meds    3. SIRS  SIRS present on admission, persistent leukocytosis noted from prior  CXR no infectious etiology, patient is anuric, no obvious signs of infection, monitor for now    4. Atrial Fibrillation  5. Chronic Diastolic CHF  6. HTN  7. HLD, Hx. CVA  8. AOCD, stable  9. Dysphagia  10. Deconditioning  11. Urothelial CA S/P Cystectomy c/b Small Bowel Enterotomy S/P Small Bowel Anastomosis+Abdominal Closure+Wound Vac  Continue home meds  Wound Care, Nutrition,  SLP, PT/OT, aspiration and fall precautions    FEN:  Fluid:  Electrolytes:  Nutrition:  Renal diet    Patient has BMI=There is no height or weight on file to calculate BMI.  Diagnosis: Obesity based on BMI criteria         Recent Labs   Lab 08/21/22  1424 08/20/22  1640 08/18/22  0230   Hemoglobin 8.9* 9.7* 8.4*   Hematocrit 27.4* 30.5* 26.1*   MCV 90.4 92.1 91.6   WBC 16.33* 15.25* 16.11*   Platelet Count 442* 446* 321     Recent Labs   Lab 07/18/22  0421 06/02/22  0514   Ferritin 2,300.21*  --    Iron 18* 62   TIBC 99* 145*   Iron Saturation 18 43     Anemia Diagnosis: Chronic: Anemia of Chronic Disease      There is no height or weight on file to calculate BMI.     Monitoring:  Continuous pulse oximetry:  Telemetry needed:  Level of care:       CODE STATUS: full code  Surrogate decision maker or Advanced Care Plan: as noted in patient's contacts    PRIMARY CARE MD: Patsy Lager, MD    Safety Checklist  DVT prophylaxis: Chemical and Mechanical   Foley: Not present   IVs:  Peripheral IV   PT/OT: Ordered   Daily CBC & or Chem ordered: Yes, due to clinical and lab instability     Patient Lines/Drains/Airways Status       Active PICC Line / CVC Line / PIV Line / Drain / Airway / Intraosseous Line /  Epidural Line / ART Line / Line / Wound / Pressure Ulcer / NG/OG Tube       Name Placement date Placement time Site Days    Permacath Catheter - Tunneled 07/14/22 Subclavian vein catheter Right 07/14/22  1404  -- 38    Peripheral IV 20 G Standard Anterior;Right Wrist --  --  Wrist  --    Wound 06/30/22 Surgical Incision Abdomen Other (Comment) WOUND VAC applied 06/30/22  1152  Abdomen  52    Wound 07/23/22 Skin Tear Buttocks;Coccyx Inner R and L cheek, coccyx 07/23/22  --  Buttocks;Coccyx  [address] DO  08/21/2022 4:23 PM         [1]   Past Medical History:  Diagnosis Date    Acid reflux     Asthma, well controlled     Cancer of kidney     Chronic lower back pain     Congestive heart disease     COPD (chronic obstructive pulmonary disease)     CVA (cerebral infarction) 01/12/2003    Diabetes     Diabetic nephropathy     Fibroids     Gout     H/O: gout     Hyperlipidemia     Hypertension     Neuropathy of hand     SOB (shortness of breath)    [2]   Allergies  Allergen Reactions    Metrizamide Shortness Of Breath and Nausea Only    Rosuvastatin Other (See Comments)     myopathy    myopathy   myopathy    myopathy myopathy      myopathy    myopathy, myopathy    Iodinated Contrast Media Other (See Comments)     Nausea  and SOB per patient   On Dialysisi    Nausea and SOB per patient    Nausea and SOB per patient      Nausea and SOB per patient   Nausea and SOB per patient    Nausea and SOB per patient      Nausea and SOB per patient Nausea and SOB per patient      Nausea and SOB per patient Nausea and SOB per patient Nausea and SOB per patient      Nausea and SOB per patient  Nausea and SOB per patient Nausea and SOB per patient    Nausea and SOB per patient   Nausea and SOB per patient    Pioglitazone      Weight gain    Sulfa Antibiotics Hives     blister   [3]   Social History  Tobacco Use    Smoking status: Former    Smokeless tobacco: Never   Vaping Use    Vaping status: Never Used    Substance Use Topics    Alcohol use: No    Drug use: No

## 2022-08-21 NOTE — Progress Notes (Signed)
Pt is in ED room  11. Time out for the pt and order prior to apply the medication to the perm catheter. Pt is alert, family member is at the bedside.  Procedure explained. Medication given per order. Report given to the primary care RN.   08/21/22 1700   Bedside Nurse Communication   Name of bedside RN - pre dialysis G, Manju RN   Treatment Initiation- With Dialysis Precautions   Time Out/Safety Check Completed Yes   Vitals   Heart Rate 95   Resp Rate 18   BP 118/56   SpO2 98 %   O2 Device None (Room air)

## 2022-08-21 NOTE — ED Notes (Signed)
Bed: A11  Expected date:   Expected time:   Means of arrival:   Comments:  Medic

## 2022-08-21 NOTE — ED Provider Notes (Signed)
[facility]  [place] [facility] EMERGENCY DEPARTMENT HISTORY & PHYSICAL EXAM      Patient Information     Patient Name: Shelby Barron, Shelby Barron  Encounter Date:  08/21/2022  Patient DOB:  [birth year]  MRN:  [id number]  Room:  11/A11  Rendering Provider: Forest Gleason, MD, M.D.    History of Presenting Illness     Chief Complaint(s): catheter malfunction    HPI Comments:   Shelby Barron is a 82 y.o. female with history of diabetes and encephalopathy as well as end-stage renal disease on hemodialysis with history from previous chart review, patient's daughter at bedside, and nephrology who called and about the patient.  Patient's hemodialysis catheter has been clogged and not working last Tuesday.  They have tried to work on getting it taken care of as outpatient in terms of vascular access fix.  This has been unsuccessful.  She was seen in emergency outside yesterday and given a dose of Kayexalate with nephrology plans to try to get catheter fixed.  This is continued to be unsuccessful.  Patient generally without significant complaints though does not say very much.  She speaks very softly stating "I am okay"      PMD: Pcp, Not[place]file, MD    Past Medical History     Past Medical History:   Diagnosis Date    Acid reflux     Asthma, well controlled     Cancer of kidney     Chronic lower back pain     Congestive heart disease     COPD (chronic obstructive pulmonary disease)     CVA (cerebral infarction) 01/12/2003    Diabetes     Diabetic nephropathy     Fibroids     Gout     H/O: gout     Hyperlipidemia     Hypertension     Neuropathy of hand     SOB (shortness of breath)        Past Surgical History     Past Surgical History:   Procedure Laterality Date    APPENDECTOMY (OPEN)      CLOSURE, ENTEROTOMY, SMALL INTESTINE  06/28/2022    Procedure: CLOSURE, ENTEROTOMY, SMALL INTESTINE;  Surgeon: Marlaine Hind, MD;  [facility];  Service: General;;    CYSTECTOMY, ILEOCONDUIT N/A 06/28/2022     Procedure: CYSTECTOMY, RADICAL;  Surgeon: Neldon Newport, MD;  [facility]: [facility];  Service: Urology;  Laterality: N/A;    DRAIN (OTHER) N/A 05/31/2022    Procedure: DRAIN (OTHER);  Surgeon: Hope Pigeon, MD;  [facility]: [facility];  Service: Interventional Radiology;  Laterality: N/A;    EXPLORATORY LAPAROTOMY N/A 06/30/2022    Procedure: SMALL BOWEL ANASTOMOSIS, CLOSURE OF MESENTERIC DEFECT, CLOSURE OF ABDOMINAL WALL;  Surgeon: Marlaine Hind, MD;  [facility];  Service: General;  Laterality: N/A;  **IP-[id number]/ MD AVAIL [id number] OR AFTER 1600**    EXPLORATORY LAPAROTOMY, RESECTION SMALL BOWEL N/A 06/28/2022    Procedure: RESECTION SMALL BOWEL;  Surgeon: Marlaine Hind, MD;  [facility];  Service: General;  Laterality: N/A;    HERNIA REPAIR      HYSTERECTOMY      LAPAROTOMY, NEPHRECTOMY Bilateral 06/28/2022    Procedure: BILATERAL OPEN RADICAL NEPHRECTOMY AND LYSIS OF ASHESION;  Surgeon: Neldon Newport, MD;  [facility]: [facility];  Service: Urology;  Laterality: Bilateral;    PARTIAL NEPHRECTOMY      tumor removed from kidney  cJanuary 2015  TUNNELED CATH PLACEMENT (PERMCATH) N/A 06/01/2022    Procedure: TUNNELED CATH PLACEMENT;  Surgeon: Suszanne Finch, MD;  [facility]: [facility];  Service: Interventional Radiology;  Laterality: N/A;    TUNNELED CATH PLACEMENT (PERMCATH) Right 07/14/2022    Procedure: TUNNELED CATH PLACEMENT;  Surgeon: Hope Pigeon, MD;  [facility];  Service: Interventional Radiology;  Laterality: Right;  right IJ tunneled HD catheter placement    TUNNELED CATH REMOVAL (PERMCATH) Right 07/14/2022    Procedure: TUNNELED CATH REMOVAL;  Surgeon: Hope Pigeon, MD;  [facility]: [facility];  Service: Interventional Radiology;  Laterality: Right;  right neck/chest tunneled HD catheter removal    TURBT      WOUND VAC APPLICATION N/A 06/28/2022    Procedure: Tawni Pummel WOUND VAC APPLICATION;  Surgeon: Marlaine Hind, MD;  [facility];  Service:  General;  Laterality: N/A;    WOUND VAC APPLICATION N/A 06/30/2022    Procedure: WOUND VAC APPLICATION;  Surgeon: Marlaine Hind, MD;  [facility];  Service: General;  Laterality: N/A;       Family History     Family History   Problem Relation Age of Onset    Cancer Mother     Heart failure Father     Cancer Maternal Aunt     Diabetes Maternal Aunt     Cancer Paternal Aunt        Social History     Social History     Occupational History    Not on file   Tobacco Use    Smoking status: Former    Smokeless tobacco: Never   Vaping Use    Vaping status: Never Used   Substance and Sexual Activity    Alcohol use: No    Drug use: No    Sexual activity: Not Currently     Partners: Male       Allergies     Allergies[1]    Home Medications     Prior to Admission medications    Medication Sig Start Date End Date Taking? Authorizing Provider   amiodarone (PACERONE) 200 MG tablet Take 1 tablet (200 mg) by mouth daily This is a maintenance dose. 08/05/22   Lennox Pippins, MD   apixaban Everlene Balls) 2.5 MG Take 1 tablet (2.5 mg) by mouth every 12 (twelve) hours 08/05/22   Lennox Pippins, MD   atorvastatin (LIPITOR) 40 MG tablet Take 1 tablet (40 mg) by mouth daily    [provider]   b complex-vitamin c-folic acid (NEPHRO-VITE) 0.8 MG Tab Take 1 tablet by mouth daily 08/08/22   Lennox Pippins, MD   carvedilol (COREG) 12.5 MG tablet Take 1 tablet (12.5 mg) by mouth every 12 (twelve) hours 08/05/22   Lennox Pippins, MD   cinacalcet (SENSIPAR) 30 MG tablet Take 1 tablet (30 mg) by mouth daily    [provider]   conjugated estrogens (PREMARIN) vaginal cream Place vaginally daily    [provider]   megestrol (MEGACE) 40 MG tablet Take 1 tablet (40 mg) by mouth 4 (four) times daily 08/13/22   Donzetta Sprung, MD   megestrol acetate (MEGACE) 400 MG/10ML Suspension Take 10 mLs (400 mg) by mouth daily 08/17/22   Donzetta Sprung, MD   midodrine (PROAMATINE) 10 MG tablet Take 1 tablet (10 mg) by mouth Once daily every  Tuesday, Thursday and Saturday morning 08/14/22   Donzetta Sprung, MD   midodrine (PROAMATINE) 5 MG tablet Take 1 tablet (5 mg) by mouth 2 (two)  times daily with meals 08/16/22   Donzetta Sprung, MD   montelukast (SINGULAIR) 10 MG tablet Take 1 tablet (10 mg) by mouth nightly    [provider]   ondansetron (Zofran) 4 MG tablet Take 1 tablet (4 mg) by mouth every 8 (eight) hours as needed for Nausea 06/04/22   Yousuf, Munib A, MD   oxybutynin XL (DITROPAN-XL) 5 MG 24 hr tablet Take 1 tablet (5 mg) by mouth daily    [provider]   pantoprazole (PROTONIX) 40 mg/20 mL Suspension Take 20 mLs (40 mg) by mouth every 12 (twelve) hours 08/05/22   Lennox Pippins, MD   polyethylene glycol (MIRALAX) 17 g packet Take 17 g by mouth daily as needed (constipation) 08/10/22   Yousuf, Munib A, MD   polyethylene glycol (MIRALAX) 17 g packet Take 17 g by mouth daily 08/13/22   Donzetta Sprung, MD   sevelamer carbonate (RENVELA) 800 MG tablet Take 1 tablet (800 mg) by mouth 2 (two) times daily    [provider]   umeclidinium-vilanterol (Anoro Ellipta) 62.5-25 MCG/ACT Aerosol Pwdr, Breath Activated Inhale 1 puff into the lungs daily    [provider]   vancomycin 25 MG/ML oral solution Take 10 mLs (250 mg) by mouth 4 (four) times daily for 10 days 08/18/22 08/28/22  Gwyndolyn Kaufman, MD         Physical Exam     Patient Vitals for the past 24 hrs:   BP Temp Temp src Pulse Resp SpO2   08/21/22 1500 124/63 98 F (36.7 C) Oral 95 21 98 %   08/21/22 1430 125/63 -- -- 94 20 96 %   08/21/22 1357 -- -- -- -- 16 --   08/21/22 1356 117/57 97.9 F (36.6 C) -- 94 -- 95 %     Physical Exam  Vitals and nursing note reviewed.   Constitutional:       Comments: Patient appears fatigued.  Speaks softly.  Not in acute distress.  Wound VAC in place to abdomen   HENT:      Head: Normocephalic and atraumatic.   Cardiovascular:      Rate and Rhythm: Normal rate and regular rhythm.      Heart sounds: S1 normal and S2 normal.    Pulmonary:      Effort: Pulmonary effort is normal.      Breath sounds: No stridor. Decreased breath sounds present.   Skin:     General: Skin is warm and dry.           Orders Placed During This Encounter     Orders Placed This Encounter   Procedures    XR Chest  AP Portable    CBC with Differential (Order)    Comprehensive Metabolic Panel    Magnesium    Phosphorus    CBC with Differential (Component)    Cardiac monitoring (ED Only)    Telemetry Monitoring - Long Term    ED Unit Sec Comm Order    ECG 12 lead    Saline lock IV    Adult Admit to Observation       ED Medications Administered     ED Medication Orders (From admission, onward)      Start Ordered     Status Ordering Provider    08/21/22 1500 08/21/22 1445  alteplase (CATH-FLO) injection 2 mg  Once        Route: Intracatheter  Ordered Dose: 2 mg  Acknowledged Threasa Alpha    08/21/22 1500 08/21/22 1445  alteplase (CATH-FLO) injection 2 mg  Once        Route: Intracatheter  Ordered Dose: 2 mg       Acknowledged Threasa Alpha            Medical Decision Making     BP 124/63   Pulse 95   Temp 98 F (36.7 C) (Oral)   Resp 21   SpO2 98%     O2 Sat in ED is 96% which is within normal limits.  Pt is being observed on continuous pulsox    Patient presenting with ongoing malfunctioning dialysis catheter.  Going on a number of days without dialysis.  Sent from facility by nephrology to be evaluated in terms of potential need for emergent dialysis in which case IR would be needed to manage catheter.  Otherwise we will plan to admit the patient to the hospital for catheter management potentially on Monday.  Will check EKG, labs, chest x-ray    Patient Shelby Barron was seen and treated by me in the ED  Forest Gleason, MD, M.D.  08/21/2022 2:07 PM    EKG interpreted by me in real-time shows: Normal sinus rhythm at 95.  Low voltage QRS.  No acute ST-T abnormality.  No T wave peaking    Diagnostic Study Results and Data Review     The results of  the diagnostic studies below were reviewed by the ED provider:    Labs  Results       Procedure Component Value Units Date/Time    Phosphorus [[id number]]  (Normal) Collected: 08/21/22 1424    Specimen: Blood, Venous Updated: 08/21/22 1455     Phosphorus 4.3 mg/dL     Comprehensive Metabolic Panel [[id number]]  (Abnormal) Collected: 08/21/22 1424    Specimen: Blood, Venous Updated: 08/21/22 1455     Glucose 76 mg/dL      BUN 52 mg/dL      Creatinine 6.6 mg/dL      Sodium 784 mEq/L      Potassium 4.8 mEq/L      Chloride 100 mEq/L      CO2 23 mEq/L      Calcium 9.1 mg/dL      Anion Gap 69.6     GFR 5.7 mL/min/1.73 m2      AST (SGOT) 13 U/L      ALT 13 U/L      Alkaline Phosphatase 123 U/L      Albumin 1.6 g/dL      Protein, Total 4.8 g/dL      Globulin 3.2 g/dL      Albumin/Globulin Ratio 0.5     Bilirubin, Total 0.3 mg/dL     Magnesium [[id number]]  (Normal) Collected: 08/21/22 1424    Specimen: Blood, Venous Updated: 08/21/22 1455     Magnesium 2.0 mg/dL     CBC with Differential (Order) [[id number]]  (Abnormal) Collected: 08/21/22 1424    Specimen: Blood, Venous Updated: 08/21/22 1452    Narrative:      The following orders were created for panel order CBC with Differential (Order).  Procedure                               Abnormality         Status                     ---------                               -----------         ------  CBC with Differential (C.Marland KitchenMarland Kitchen[[id number]]  Abnormal            Final result                 Please view results for these tests on the individual orders.    CBC with Differential (Component) [[id number]]  (Abnormal) Collected: 08/21/22 1424    Specimen: Blood, Venous Updated: 08/21/22 1452     WBC 16.33 x10 3/uL      Hemoglobin 8.9 g/dL      Hematocrit 30.8 %      Platelet Count 442 x10 3/uL      MPV 9.1 fL      RBC 3.03 x10 6/uL      MCV 90.4 fL      MCH 29.4 pg      MCHC 32.5 g/dL      RDW 16 %      nRBC % 0.1 /100 WBC      Absolute nRBC 0.02 x10 3/uL      Preliminary  Absolute Neutrophil Count 10.66 x10 3/uL      Neutrophils % 65.3 %      Lymphocytes % 21.8 %      Monocytes % 10.8 %      Eosinophils % 0.2 %      Basophils % 0.2 %      Immature Granulocytes % 1.7 %      Absolute Neutrophils 10.66 x10 3/uL      Absolute Lymphocytes 3.56 x10 3/uL      Absolute Monocytes 1.76 x10 3/uL      Absolute Eosinophils 0.04 x10 3/uL      Absolute Basophils 0.04 x10 3/uL      Absolute Immature Granulocytes 0.27 x10 3/uL             Radiologic Studies  Radiology Results (24 Hour)       Procedure Component Value Units Date/Time    XR Chest  AP Portable [[id number]] Collected: 08/21/22 1434    Order Status: Completed Updated: 08/21/22 1437    Narrative:      HISTORY: Renal failure.     COMPARISON: 08/20/2022    FINDINGS:   Right jugular catheter remains in place. No significant change mild  pulmonary vascular congestion and mild basal atelectasis. Retrocardiac  opacity correlating with hiatal hernia. Possible persistent small left  pleural effusion. No pneumothorax. Cardiac mediastinal silhouette stable.      Impression:        No significant change mild pulmonary vascular congestion and mild basal  atelectasis. Hiatal hernia.    Clide Cliff, MD  08/21/2022 2:35 PM              Clinical Course       2:11 PM  D/w Dr. Eldridge Scot and Janice Norrie of nephrology and will await results to determine urgency of HD    2:15 PM  D/w Dr. Letta Moynahan of IR and awaits labs and nephro determination of urgency of HD.  There are follow-up patients at [facility] but no other pending cases so potentially might be Monday for HD catheter fix but will follow along    3:06 PM  Continue to be in touch with Dr. Eldridge Scot from nephrology as well as Dr. Letta Moynahan from IR.  Patient's labs are stable.  Patient will be admitted and dialysis nurse upstairs will try to instill Cathflo for declogging.  Will plan to admit to telemetry in the meantime and monitor.  Updated patient and daughter who are in agreement.  Discussed that white blood count  remains somewhat elevated but relatively stable and we can continue to monitor this.  Level of anemia continues to be present with some fluctuation and can continue to monitor    3:14 PM  Discussed with hospitalist who will admit to telemetry observation      Procedures       Procedures      Critical Care       na    Prescriptions       New Prescriptions    No medications on file       Diagnosis and Disposition     Clinical Impression:  1. Hemodialysis catheter dysfunction, initial encounter    2. ESRD (end stage renal disease)    3. Dependence on hemodialysis    4. Encephalopathy    5. Type 2 diabetes mellitus with chronic kidney disease on chronic dialysis, with long-term current use of insulin    6. Chronic diastolic congestive heart failure    7. Benign essential hypertension    8. Chronic obstructive pulmonary disease, unspecified COPD type      Final diagnoses:   Hemodialysis catheter dysfunction, initial encounter   ESRD (end stage renal disease)   Dependence on hemodialysis   Encephalopathy   Type 2 diabetes mellitus with chronic kidney disease on chronic dialysis, with long-term current use of insulin   Chronic diastolic congestive heart failure   Benign essential hypertension   Chronic obstructive pulmonary disease, unspecified COPD type       Disposition:  ED Disposition       ED Disposition   Observation    Condition   --    Date/Time   Sat Aug 21, 2022  3:12 PM    Comment   Admitting Physician: [name] [[id number]]   Service:: Medicine [[id number]]   Estimated Length of Stay: < 2 midnights   Tentative Discharge Plan?: Home or Self Care [1]   Does patient need telemetry?: Yes   Is patient 18 yrs or greater?: Yes   Telemetry type (separate Telemetry order is also required):: Adult telemetry   Special Needs:: Fall Risk   Special Needs:: Hemodialysis                   This note was generated by the [facility] EMR system/Dragon speech recognition and may contain inherent errors or omissions not intended by the user.  Grammatical errors, random word insertions, deletions, pronoun errors and incomplete sentences are occasional consequences of this technology due to software limitations. Not all errors are caught or corrected. If there are questions or concerns about the content of this note or information contained within the body of this dictation they should be addressed directly with the author for clarification.             [1]   Allergies  Allergen Reactions    Metrizamide Shortness Of Breath and Nausea Only    Rosuvastatin Other (See Comments)     myopathy    myopathy   myopathy    myopathy myopathy      myopathy    myopathy, myopathy    Iodinated Contrast Media Other (See Comments)     Nausea and SOB per patient   On Dialysisi    Nausea and SOB per patient    Nausea and SOB per patient      Nausea and SOB per patient   Nausea and SOB per patient    Nausea and SOB per patient  Nausea and SOB per patient Nausea and SOB per patient      Nausea and SOB per patient Nausea and SOB per patient Nausea and SOB per patient      Nausea and SOB per patient  Nausea and SOB per patient Nausea and SOB per patient    Nausea and SOB per patient   Nausea and SOB per patient    Pioglitazone      Weight gain    Sulfa Antibiotics Hives     blister        Forest Gleason, MD  08/21/22 2134

## 2022-08-21 NOTE — ED to IP RN Note (Signed)
[facility] EMERGENCY DEPARTMENT   ED Nursing Note For The Receiving Inpatient Nurse - UPDATE     The following information is an update to the previous ED Nursing Admission Note:    Cathflo to the Dialysis catheter by Dialysis RN, RN will come back about an hour to check a catheter.      VITAL SIGNS     Vitals:    08/21/22 1700   BP: 118/56   Pulse: 95   Resp: 18   Temp:    SpO2: 98%     Pain Score: 0-No pain (08/21/22 1426)  PAINAD Score: 3 (08/21/22 1630)

## 2022-08-21 NOTE — EDIE (Signed)
[facility]?NOTIFICATION?08/21/2022 13:46?Shelby Barron, Shelby Barron?MRN: [id number]    Criteria Met      3 Different Facilities in 90 Days    Security and Safety  No Security Events were found.  ED Care Guidelines  There are currently no ED Care Guidelines for this patient. Please check your facility's medical records system.        Prescription Monitoring Program  Narx Score not available at this time.    E.D. Visit Count (12 mo.)  Facility Visits   [place] [facility] 1   [place] [facility] 1   Total 2   Note: Visits indicate total known visits.     Recent Emergency Department Visit Summary  Date Facility [facility] Type Diagnoses or Chief Complaint    Aug 21, 2022  [facility] [place] [facility]  [place]   Emergency      .      Aug 20, 2022  [place] [facility] [place] [facility]  [place]  [place]  Emergency      Unspecified complication of cardiac and vascular prosthetic device, implant and graft, initial encounter      Vascular Access Problem      Clogged Dialysis [facility]        Recent Inpatient Visit Summary  Date Facility [facility] Type Diagnoses or Chief Complaint    Jun 28, 2022   [facility] [place] [facility]  [place]  Medical Surgical      End stage renal disease      Encounter for planned postprocedural wound closure      Other shock      Malignant neoplasm of left kidney, except renal pelvis      Malignant neoplasm of bladder, unspecified      May 26, 2022   - [place] [facility]  [place]  Medical Surgical      End stage renal disease      Peritonitis, unspecified      Shortness of breath        Care Team  Provider Specialty Phone Fax Service Dates   Johney [place], [place] Nurse Practitioner: Family [phone]  Current    BEHIRI, [facility] , DO Internal Medicine   Current      [facility]  This patient has registered at the [facility] Emergency Department  For more information visit: [email or web address]     PLEASE NOTE:     1.   Any care recommendations and  other clinical information are provided as guidelines or for historical purposes only, and providers should exercise their own clinical judgment when providing care.    2.   You may only use this information for purposes of treatment, payment or health care operations activities, and subject to the limitations of applicable [facility] Policies.    3.   You should consult directly with the organization that provided a care guideline or other clinical history with any questions about additional information or accuracy or completeness of information provided.    ? 2024 [facility] - [email or web address]

## 2022-08-21 NOTE — Pharmacy Admission Med History Advanced (Signed)
Medication Reconciliation for Admission    Patient's Pharmacy Information:    Primary Pharmacy Updated in Epic: Yes  Preferred pharmacy:   [id number] Mackie Pai, [place] - [address]  [address]  [place] [address]  Phone: [phone] Fax: [phone]        Primary Source of Medication History: Family Member and Medication List [facility])  ** Eliquis 2.5 mg po q 12 hrs. Last dose taken on 08/20/22 PM. Hx: afib       Prior to Admission Medication List:  Home Medications       Med List Status: Pharmacy Completed Set By: Heloise Ochoa, RPh at 08/21/2022  4:10 PM              acetaminophen (TYLENOL) 500 MG tablet     Take 2 tablets (1,000 mg) by mouth 3 (three) times daily     amiodarone (PACERONE) 200 MG tablet     Take 1 tablet (200 mg) by mouth daily This is a maintenance dose.     apixaban (ELIQUIS) 2.5 MG     Take 1 tablet (2.5 mg) by mouth every 12 (twelve) hours     atorvastatin (LIPITOR) 40 MG tablet     Take 1 tablet (40 mg) by mouth daily     b complex-vitamin c-folic acid (NEPHRO-VITE) 0.8 MG Tab     Take 1 tablet by mouth daily     carvedilol (COREG) 12.5 MG tablet     Take 1 tablet (12.5 mg) by mouth every 12 (twelve) hours     cinacalcet (SENSIPAR) 30 MG tablet     Take 1 tablet (30 mg) by mouth every evening     conjugated estrogens (PREMARIN) vaginal cream     Place vaginally every other day At bedtime     megestrol (MEGACE) 40 MG tablet     Take 1 tablet (40 mg) by mouth 4 (four) times daily     megestrol acetate (MEGACE) 400 MG/10ML Suspension     Take 10 mLs (400 mg) by mouth daily     midodrine (PROAMATINE) 10 MG tablet     Take 1 tablet (10 mg) by mouth Once daily every Tuesday, Thursday and Saturday morning     Patient taking differently: Take 1 tablet (10 mg) by mouth Once daily every Tuesday, Thursday and Saturday morning Hold for SBP > 140     midodrine (PROAMATINE) 5 MG tablet     Take 1 tablet (5 mg) by mouth 2  (two) times daily with meals     montelukast (SINGULAIR) 10 MG tablet     Take 1 tablet (10 mg) by mouth nightly     ondansetron (Zofran) 4 MG tablet     Take 1 tablet (4 mg) by mouth every 8 (eight) hours as needed for Nausea     oxybutynin XL (DITROPAN-XL) 5 MG 24 hr tablet     Take 1 tablet (5 mg) by mouth daily     pantoprazole (PROTONIX) 20 MG tablet     Take 1 tablet (20 mg) by mouth every 12 (twelve) hours     polyethylene glycol (MIRALAX) 17 g packet     Take 17 g by mouth daily as needed (constipation)     polyethylene glycol (MIRALAX) 17 g packet     Take 17 g by mouth daily     sevelamer carbonate (RENVELA) 800 MG tablet     Take  1 tablet (800 mg) by mouth 3 (three) times daily with meals     traMADol (ULTRAM) 50 MG tablet     Take 1 tablet (50 mg) by mouth every 8 (eight) hours as needed for Pain     umeclidinium-vilanterol (Anoro Ellipta) 62.5-25 MCG/ACT Aerosol Pwdr, Breath Activated     Inhale 1 puff into the lungs daily                    Heloise Ochoa, RPh

## 2022-08-21 NOTE — Plan of Care (Signed)
Problem: Renal Instability  Goal: Fluid and electrolyte balance are achieved/maintained  Outcome: Progressing  Flowsheets (Taken 08/21/2022 2306)  Fluid and electrolyte balance are achieved/maintained:   Monitor/assess lab values and report abnormal values   Observe for cardiac arrhythmias   Monitor for muscle weakness   Assess and reassess fluid and electrolyte status   Follow fluid restrictions/IV/PO parameters     Problem: Patient Receiving Advanced Renal Therapies  Goal: Therapy access site remains intact  Outcome: Progressing  Flowsheets (Taken 08/21/2022 2306)  Therapy access site remains intact:   Change therapy access site dressing as needed   Assess therapy access site

## 2022-08-22 LAB — CBC
Absolute nRBC: 0.02 10*3/uL — ABNORMAL HIGH (ref <=0.00)
Hematocrit: 25.8 % — ABNORMAL LOW (ref 34.7–43.7)
Hemoglobin: 8.4 g/dL — ABNORMAL LOW (ref 11.4–14.8)
MCH: 29.6 pg (ref 25.1–33.5)
MCHC: 32.6 g/dL (ref 31.5–35.8)
MCV: 90.8 fL (ref 78.0–96.0)
MPV: 9.2 fL (ref 8.9–12.5)
Platelet Count: 457 10*3/uL — ABNORMAL HIGH (ref 142–346)
RBC: 2.84 10*6/uL — ABNORMAL LOW (ref 3.90–5.10)
RDW: 16 % — ABNORMAL HIGH (ref 11–15)
WBC: 16.64 10*3/uL — ABNORMAL HIGH (ref 3.10–9.50)
nRBC %: 0.1 /100 WBC — ABNORMAL HIGH (ref <=0.0)

## 2022-08-22 LAB — MAGNESIUM: Magnesium: 2.1 mg/dL (ref 1.6–2.6)

## 2022-08-22 LAB — BASIC METABOLIC PANEL
Anion Gap: 20 — ABNORMAL HIGH (ref 5.0–15.0)
BUN: 61 mg/dL — ABNORMAL HIGH (ref 7–21)
CO2: 23 mEq/L (ref 17–29)
Calcium: 9 mg/dL (ref 7.9–10.2)
Chloride: 98 mEq/L — ABNORMAL LOW (ref 99–111)
Creatinine: 6.9 mg/dL — ABNORMAL HIGH (ref 0.4–1.0)
GFR: 5.4 mL/min/{1.73_m2} — ABNORMAL LOW (ref >=60.0)
Glucose: 60 mg/dL — ABNORMAL LOW (ref 70–100)
Potassium: 4.5 mEq/L (ref 3.5–5.3)
Sodium: 141 mEq/L (ref 135–145)

## 2022-08-22 LAB — WHOLE BLOOD GLUCOSE POCT
Whole Blood Glucose POCT: 119 mg/dL — ABNORMAL HIGH (ref 70–100)
Whole Blood Glucose POCT: 151 mg/dL — ABNORMAL HIGH (ref 70–100)
Whole Blood Glucose POCT: 69 mg/dL — ABNORMAL LOW (ref 70–100)

## 2022-08-22 MED ORDER — HYDROMORPHONE HCL 1 MG/ML IJ SOLN
0.2500 mg | Freq: Once | INTRAMUSCULAR | Status: AC
Start: 2022-08-22 — End: 2022-08-22
  Administered 2022-08-22: 0.25 mg via INTRAVENOUS
  Filled 2022-08-22: qty 0.5

## 2022-08-22 NOTE — Plan of Care (Signed)
Problem: Renal Instability  Goal: Fluid and electrolyte balance are achieved/maintained  Outcome: Not Progressing  Flowsheets (Taken 08/21/2022 2306 by Fanny Bien, Journey, RN)  Fluid and electrolyte balance are achieved/maintained:   Monitor/assess lab values and report abnormal values   Observe for cardiac arrhythmias   Monitor for muscle weakness   Assess and reassess fluid and electrolyte status   Follow fluid restrictions/IV/PO parameters     Problem: Renal Instability  Goal: Nutritional intake is adequate  Outcome: Progressing  Flowsheets (Taken 08/21/2022 1832 by Illene Regulus A, RN)  Nutritional intake is adequate:   Consult/collaborate with Clinical Nutritionist   Consult/collaborate with Speech Therapy (swallow evaluations)  Goal: Perineal skin integrity is maintained or improved and remains free from infection  Outcome: Progressing  Flowsheets (Taken 08/22/2022 1422)  Perineal skin integrity is maintained or improved:   Apply urinary containment device as appropriate and/or per order   Utilize bladder scans prior to or post void as appropriate   Encourage patient to identify medications that aid bladder function prior to administration   Encourage bladder emptying at regular intervals   Assess need for indwelling catheter every shift and discuss with LIP     Problem: Impaired Mobility  Goal: Mobility/Activity is maintained at optimal level for patient  Outcome: Progressing  Flowsheets (Taken 08/21/2022 1832 by Ardine Eng, Ros-Gassandre A, RN)  Mobility/activity is maintained at optimal level for patient:   Encourage independent activity per ability   Consult/collaborate with Physical Therapy and/or Occupational Therapy     Problem: Nutrition  Goal: Nutritional intake is adequate  Outcome: Progressing  Flowsheets (Taken 08/21/2022 1832 by Ardine Eng, Ros-Gassandre A, RN)  Nutritional intake is adequate:   Consult/collaborate with Clinical Nutritionist   Consult/collaborate with Speech Therapy (swallow  evaluations)     Problem: Patient Receiving Advanced Renal Therapies  Goal: Therapy access site remains intact  Flowsheets (Taken 08/21/2022 2306 by Egbert Garibaldi, RN)  Therapy access site remains intact:   Change therapy access site dressing as needed   Assess therapy access site

## 2022-08-22 NOTE — PT Eval Note (Signed)
Physical Therapy Evaluation    Shelby Barron    Unit: [facility] 3 WEST  Bed: Z610/R604.54      Post Acute Care Therapy Recommendations:   Discharge Recommendations:  SNF    If SNF  recommended discharge disposition is not available, patient will need LTC or 24/7 caregiver assist for transfers/supervision, hoyer, hospital bed, w/c and HHPT/OT.   DME needs IF patient is discharging home: [employer], Hospital bed    Therapy discharge recommendations may change with patient status.  Please refer to most recent note for up-to-date recommendations.    Recommended (non-medical) mode of transportation at discharge:  stretcher    PMP - Progressive Mobility Protocol   PMP Activity: Step 3 - Bed Mobility  Distance Walked (ft) (Step 6,7): 0 Feet        Evaluation:   Consult received for Shelby Barron for PT evaluation and treatment.  Chart reviewed.  Patient's medical condition is appropriate for Physical Therapy intervention at this time.     Medical Diagnosis: Benign essential hypertension [I10]  Encephalopathy [G93.40]  ESRD (end stage renal disease) [N18.6]  Dependence on hemodialysis [Z99.2]  Chronic diastolic congestive heart failure [I50.32]  Hemodialysis catheter dysfunction, initial encounter [T82.41XA]  Chronic obstructive pulmonary disease, unspecified COPD type [J44.9]  Type 2 diabetes mellitus with chronic kidney disease on chronic dialysis, with long-term current use of insulin [E11.22, N18.6, Z99.2, Z79.4]    Therapy Diagnosis: difficulty walking, generalized muscle weakness    Precautions  Weight Bearing Status: no restrictions  Other Precautions: (+) CVA, falls        History of Present Illness: Shelby Barron is a 82 y.o. female admitted on 08/21/2022 with  HD  port non functional. Recent d/c from [facility] for abdominal surgery and CVA. Admit from SNF, has been at SNF x2-3 days.       Problem List[1]  Medical History[2]  Past Surgical History:   Procedure Laterality Date    APPENDECTOMY  (OPEN)      CLOSURE, ENTEROTOMY, SMALL INTESTINE  06/28/2022    Procedure: CLOSURE, ENTEROTOMY, SMALL INTESTINE;  Surgeon: Marlaine Hind, MD;  [facility];  Service: General;;    CYSTECTOMY, ILEOCONDUIT N/A 06/28/2022    Procedure: CYSTECTOMY, RADICAL;  Surgeon: Neldon Newport, MD;  [facility];  Service: Urology;  Laterality: N/A;    DRAIN (OTHER) N/A 05/31/2022    Procedure: DRAIN (OTHER);  Surgeon: Hope Pigeon, MD;  Location: [facility];  Service: Interventional Radiology;  Laterality: N/A;    EXPLORATORY LAPAROTOMY N/A 06/30/2022    Procedure: SMALL BOWEL ANASTOMOSIS, CLOSURE OF MESENTERIC DEFECT, CLOSURE OF ABDOMINAL WALL;  Surgeon: Marlaine Hind, MD;  [facility]: [facility];  Service: General;  Laterality: N/A;  **IP-[id number]/ MD AVAIL [id number] OR AFTER 1600**    EXPLORATORY LAPAROTOMY, RESECTION SMALL BOWEL N/A 06/28/2022    Procedure: RESECTION SMALL BOWEL;  Surgeon: Marlaine Hind, MD;  [facility]: [facility];  Service: General;  Laterality: N/A;    HERNIA REPAIR      HYSTERECTOMY      LAPAROTOMY, NEPHRECTOMY Bilateral 06/28/2022    Procedure: BILATERAL OPEN RADICAL NEPHRECTOMY AND LYSIS OF ASHESION;  Surgeon: Neldon Newport, MD;  [facility]: [facility];  Service: Urology;  Laterality: Bilateral;    PARTIAL NEPHRECTOMY      tumor removed from kidney  cJanuary 2015    TUNNELED CATH PLACEMENT (PERMCATH) N/A 06/01/2022    Procedure: TUNNELED CATH PLACEMENT;  Surgeon: Arlyce Dice,  Veverly Fells, MD;  [facility];  Service: Interventional Radiology;  Laterality: N/A;    TUNNELED CATH PLACEMENT (PERMCATH) Right 07/14/2022    Procedure: TUNNELED CATH PLACEMENT;  Surgeon: Hope Pigeon, MD;  Location: [facility];  Service: Interventional Radiology;  Laterality: Right;  right IJ tunneled HD catheter placement    TUNNELED CATH REMOVAL (PERMCATH) Right 07/14/2022    Procedure: TUNNELED CATH REMOVAL;  Surgeon: Hope Pigeon, MD;  Location: [facility];  Service: Interventional Radiology;   Laterality: Right;  right neck/chest tunneled HD catheter removal    TURBT      WOUND VAC APPLICATION N/A 06/28/2022    Procedure: Tawni Pummel WOUND VAC APPLICATION;  Surgeon: Marlaine Hind, MD;  [facility]: [facility];  Service: General;  Laterality: N/A;    WOUND VAC APPLICATION N/A 06/30/2022    Procedure: WOUND VAC APPLICATION;  Surgeon: Marlaine Hind, MD;  [facility]: [facility];  Service: General;  Laterality: N/A;         Previous Functional Level/Home Living Situation  Prior Level of Function  Prior level of function: Independent with ADLs;Ambulates independently  Baseline Activity Level: Community ambulation  DME Currently at Home: ADL- Grab Bars;ADL- Soil scientist, [employer] Living Arrangements  Living Arrangements: Spouse/significant other;Children  Type of Home: House  Home Layout: Multi-level;Bed/bath upstairs;Stairs to enter with rails (add number in comment)  Bathroom Shower/Tub: Walk-in Physiological scientist: Standard  Bathroom Equipment: Grab bars in shower;Shower chair  DME Currently at Home: ADL- Grab Bars;ADL- Soil scientist, [employer]  Home Living - Notes / Comments:  (Home set up is daughter's town home. Pt was living in [facility] w/ husband prior to abdominal surgery. Came to [facility] for surgery.)        Subjective: Patient is agreeable to participation in the therapy session. Nursing clears patient for therapy.   Pain Assessment  Pain Assessment: FLACC  FLACC (Face, Legs, Activity, Crying, Consolability)  Pain Rating: FLACC (rest) - Face: no particular expression or smile  Pain Rating: FLACC (rest) - Legs: normal position or relaxed  Pain Rating: FLACC (rest) - Activity: lying quietly, normal position, moves easily  Pain Rating: FLACC (rest) - Cry: no cry (awake or asleep)  Pain Rating: FLACC (rest) - Consolability: content, relaxed  Score: FLACC (rest): 0  Pain Rating: FLACC (activity) - Face: occasional grimace or frown, withdrawn, disinterested  Pain Rating: FLACC (activity) -  Legs: normal position or relaxed  Pain Rating: FLACC (activity): squirming, shifting back and forth, tense  Pain Rating: FLACC (activity) - Cry: no cry (awake or asleep)  Pain Rating: FLACC (activity) - Consolability: reassured by occasional touch, hug or being talked to  Score: FLACC (activity): 3               Objective:  Patient is in bed with telemetry in place.    Observation of patient/vitals            Cognition/Neuro Status  Arousal/Alertness: Delayed responses to stimuli  Attention Span: Difficulty dividing attention  Orientation Level: Oriented to person  Memory: Unable to assess  Following Commands: Follows one step commands with repetition  Problem Solving: Unable to assess  Behavior: cooperative;calm;attentive        Musculoskeletal Examination  Gross ROM  Right Lower Extremity ROM: needs focused assessment (limited tolerance for PROM)  Left Lower Extremity ROM: needs focused assessment (limited tolerance for PROM)  Gross Strength  Right Lower Extremity Strength: 1/5  Left Lower Extremity Strength: 1/5                Sensation: unable to assess      Functional Mobility  Rolling: Dependent  Supine to Sit: Dependent  Sit to Supine: Dependent                           AM-PAC Basic Mobility  Turning Over in Bed: Unable  Sitting Down On/Standing From Armchair: Unable  Lying on Back to Sitting on Side of Bed: Unable  Assist Moving to/from Bed to Chair: Total  Assist to Walk in Hospital Room: Total  Assist to Climb 3-5 Steps with Railing: Total  PT Basic Mobility Raw Score: 6           Participation and Endurance  Participation Effort: poor  Endurance: Tolerates 10 - 20 min exercise with multiple rests           Treatment:    Initial evaluation. Patient limited participation where she would become inattenive or distracted. Per OT conversation with daughter, has been like this since her [facility] hospital admission. Patient limited participation  noted      Assessment:    Assessment: Gait impairment;Decreased balance;Decreased functional mobility;Decreased endurance/activity tolerance;Decreased cognition;Decreased safety/judgement during functional mobility;Decreased LE strength      Patient History: (Low = none; Mod = 1-2; High = 3 or more)   Personal factors and pre-existing co-morbidities affecting plan of care include home environment    Examination: (Low = 1-2; Mod = 3 or more; High = 4 or more)   Plan of care will address impairments in the follow body systems: strength, function, gait, and balance as demonstrated by these objective measures: vital signs, manual muscle test, AM-PAC, and gait analysis    Clinical presentation is evolving due to decreased safety awareness  Plan of care complexity impacted by situation that requires close monitoring of vitals signs, close monitoring of labs, modifications of tasks, and family/caregiver training        Prognosis: Fair        Plan:       Patient Goal: none stated     Risks/Benefits/POC Discussed with Pt/Family: With patient       Treatment/Interventions: Exercise;Gait training;Stair training;Bed mobility;Equipment eval/education;Patient/family training    Plan  Risks/Benefits/POC Discussed with Pt/Family: With patient  Patient Goal: none stated  Treatment/Interventions: Exercise;Gait training;Stair training;Bed mobility;Equipment eval/education;Patient/family training  PT Frequency: 4-5x/wk    Goals   Goals  Goal Formulation: With patient  Time for Goal Acheivement: 5 visits  Pt Will Go Supine To Sit: with minimal assist;to maximize functional mobility and independence;5 visits  Pt Will Perform Sit To Supine: with minimal assist;to maximize functional mobility and independence;5 visits  Pt Will Perform Sit to Stand: with minimal assist;to maximize functional mobility and independence;5 visits  Pt Will Transfer Bed/Chair: with minimal assist;to maximize functional mobility and independence;5 visits  Pt Will  Ambulate: 1-10 feet;with rolling walker;with moderate assist;to maximize functional mobility and independence;5 visits          Interdisciplinary Communication:   Pt left up in bed with alarm activated.  Updated white communication board in room with patient's current mobility status  Communicated with Everlean Alstrom via [id number] regarding mobility, transfers.      Education:   Educated patient  to role of physical therapy, plan of care, transfer training techniques, falls prevention, gait training techniques with  fww, home safety, next appropriate level of care, exercises for lower extremity.      Patient  demonstrated and verbalized understanding.      Discussed goals of therapy with patient , in agreement with plan.           MD Co-sign needed:  Y      Time Calculation  PT Received On: 08/22/22  Start Time: 1245  Stop Time: 1305  Time Calculation (min): 20 min          Signature:  Valetta Close, PT  08/22/2022  1:20 PM            Unit: [facility] 3 WEST  Bed: Z610/R604.54         [1]   Patient Active Problem List  Diagnosis    Type 2 diabetes mellitus with kidney complication, with long-term current use of insulin    Diabetes mellitus type II, uncontrolled    Neuropathy of hand    Left renal mass    Hamstring injury    Peritonitis    ESRD (end stage renal disease)    Chronic diastolic congestive heart failure    Aortic atherosclerosis    Malignant neoplasm of urinary bladder, unspecified site    Malignant tumor of kidney, left    Benign essential hypertension    GERD (gastroesophageal reflux disease)    Leukocytosis    Bladder cancer    Anemia    COPD (chronic obstructive pulmonary disease)    Hemodialysis catheter dysfunction, initial encounter   [2]   Past Medical History:  Diagnosis Date    Acid reflux     Asthma, well controlled     Cancer of kidney     Chronic lower back pain     Congestive heart disease     COPD (chronic obstructive pulmonary disease)     CVA (cerebral infarction) 01/12/2003    Diabetes      Diabetic nephropathy     Fibroids     Gout     H/O: gout     Hyperlipidemia     Hypertension     Neuropathy of hand     SOB (shortness of breath)

## 2022-08-22 NOTE — UM Notes (Signed)
PATIENT NAME: Ogletree,Roxann M   DOB: 03-18-1940     UPGRADED TO INPATIENT  Admit to Inpatient (Order #[id number]) on 08/22/22     2nd [place] medical necessity with plan for Permacath exchange tomorrow.    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 female with a PMHx of [name], RCC S/P Bilateral Nephrectomy, AF on Eliquis, Urothelial CA S/P Cystectomy c/b Small Bowel Enterotomy S/P Small Bowel Anastomosis+Abdominal Closure+Wound Vac, Chronic Diastolic CHF, DM Type II, HTN, HLD, Hx. CVA (residual LUE flaccid paralysis), AOCD, and Gout who presented with Clogged HD Catheter, has not received HD since Tuesday.     Patient recently discharged to SNF after extensive complicated admission at [facility]. Her HD catheter has been clogged and non-functioning for the past 4 days, thus has not received dialysis since then. Nephrology evaluated her as outpatient, gave kayexalate and tried to manage as outpatient, however was unsuccessful so with worsening labs she was sent to the hospital for management. In the ED, VSS, labs WBC 16, Hgb 8.9, eGFR 5.7. IR and Nephrology consulted. Patient tracking with eyes, but nonverbal and not following commands on exam, daughter at bedside reports patient is not responding because she is upset about being back in the hospital, she has a caretaker named Jerrye Beavers who is the only person the  patient will speak to and take medication from.    Vital Signs  On RA  BP 153/71   Pulse 79   Temp 97.6 F (36.4 C) (Oral)   Resp 17   Wt 82.2 kg (181 lb 3.5 oz)   SpO2 95%   BMI 30.16 kg/m     Medications  Scheduled Meds:  Current Facility-Administered Medications   Medication Dose Route Frequency    amiodarone  200 mg Oral Daily    apixaban  2.5 mg Oral Q12H SCH    atorvastatin  40 mg Oral Daily    b complex-vitamin c-folic acid  1 tablet Oral Daily    carvedilol  12.5 mg Oral Q12H [facility]    cinacalcet  30 mg Oral QPM    midodrine  5 mg Oral BID Meals    montelukast  10 mg Oral QHS    oxybutynin XL  5 mg Oral Daily    pantoprazole  40 mg Oral Daily    sevelamer carbonate  800 mg Oral TID MEALS    umeclidinium-vilanterol  1 puff Inhalation QAM     Abnormal Labs    08/30/2022 14:24 08/22/22 03:33   WBC 16.33 (H) 16.64 (H)   Hemoglobin 8.9 (L) 8.4 (L)   Hematocrit 27.4 (L) 25.8 (L)   Platelet Count 442 (H) 457 (H)   RBC 3.03 (L) 2.84 (L)   RDW 16 (H) 16 (H)   Instrument Absolute Neutrophil Count 10.66 (H)     Lymphocytes Absolute Automated 3.56 (H)     Monocytes Absolute Automated 1.76 (H)     Immature  Granulocytes Absolute 0.27 (H)     Nucleated RBC 0.1 (H) 0.1 (H)   Neutrophils Absolute 10.66 (H)     Nucleated RBC Absolute 0.02 (H) 0.02 (H)   Glucose 76 60 (L)   BUN 52 (H) 61 (H)   Creatinine 6.6 (H) 6.9 (H)   Chloride 100 98 (L)   Anion Gap 16.0 (H) 20.0 (H)   EGFR 5.7 (L) 5.4 (L)   Alkaline Phosphatase 123 (H)     Albumin 1.6 (L)     Protein Total 4.8 (L)     Albumin/Globulin Ratio 0.5 (L)        Plan of Care  Permacath exchange tomorrow  HD tomorrow  NPO after MN  D/c back to [facility] after HD tomorrow  Aranesp tomorrow      Primary Coverage:  Payor: MEDICARE / Plan: MEDICARE PART A AND B / Product Type: Medicare /     UTILIZATION REVIEW CONTACT: Vernie Murders, RN, BSN  Utilization Review   [facility]  [phone]  [phone]  Email: [email or web address]  Tax ID:   [phone]

## 2022-08-22 NOTE — Progress Notes (Signed)
CASE MANAGEMENT PROGRESS NOTE      Patient: Shelby Barron (82 y.o. female)  Admission Date: 08/21/2022 [facility] Day 0)    Active Hospital Problems    Diagnosis    Hemodialysis catheter dysfunction, initial encounter       Length of stay: Hospital Day 0    Discharge plan: Return to [facility] nursing  Anticipated date of discharge: later today vs tomorrow  Barriers to discharge: [place] ready          Patient Updates:  [name] reached out to me to let me know that patient will go back to room 119B and report can be called to [phone].  They can accept patient whenever she is ready.     Dtr wants [name] used for transport- [phone]    Jodi Marble RN, BSN  RN Case Manager I  Case Management Dept  [phone]

## 2022-08-22 NOTE — Progress Notes (Signed)
Initial Case Management Assessment and Discharge Planning  [facility]   Patient Name: RINOA, GARRAMONE   Date of Birth December 07, 1940   Attending Physician: Regina Eck, MD   Primary Care Physician: Patsy Lager, MD   Length of Stay 0   Reason for Consult / Chief Complaint IDPA        Situation   Admission DX:   1. Hemodialysis catheter dysfunction, initial encounter    2. ESRD (end stage renal disease)    3. Dependence on hemodialysis    4. Encephalopathy    5. Type 2 diabetes mellitus with chronic kidney disease on chronic dialysis, with long-term current use of insulin    6. Chronic diastolic congestive heart failure    7. Benign essential hypertension    8. Chronic obstructive pulmonary disease, unspecified COPD type        A/O Status: Partially    LACE Score: 11    Patient admitted from: ER  Admission Status: observation    Health Care Agent: Self       Background     Advanced directive:   <no information>    has NO advance directive - additional information requested- currently working on an updated one    Code Status:   Full Code     Residence: Multi-story home    PCP: PCP Not on File, MD  Patient Contact:   [phone] (home)     [phone] (mobile)     Emergency contact:   Extended Emergency Contact Information  Primary Emergency Contact: Tash,Nicole  Address: [address]           [place], [place] [address] Darden [place] of [employer] Phone: [phone]  Relation: Daughter      ADL/IADL's: Assistive Device  Previous Level of function: 7 Independent     DME: None    Pharmacy:     [id number] [facility], [place] - [address] AT [address]  [address] [place]  [place] [address]  Phone: [phone] Fax: [phone]      Prescription Coverage: Yes    Home Health: The patient is not currently receiving home health services.    Previous SNF/AR: [facility]    Date First IMM given: obs status  UAI on file?: No  Transport for discharge? Mode of  transportation: [facility] is the transport that patient and family use  Agreeable to SNF: [facility]  post-discharge:  Yes     Assessment   Spoke w dtr; verified all as above. Patient has been staying in [place] w her dtr at [phone].  However Dtr has recently privately paid for patient to be at [facility] center.  Where is has been getting her HD Tues/ Thurs and Saturdays.  Once her port is fixed patient should return to [facility] center. [facility] referral sent for easy transition.  Patient denies issues obtaining/affording medication,  food, transportation, housing, and finances.There were no other CM needs noted at the time of my assessment, the CM team will continue to follow peripherally as needed.    Family uses [facility] for transport and the dtr would prefer to use only them as they work very well w her mom!  They are at [phone] ([place] C)  BARRIERS TO DISCHARGE: port functioning     Recommendation   D/C Plan A: SNF- bed privately paid for already    D/C Plan B: SNF    D/C Plan C: SNF  Jodi Marble RN, BSN  RN Case Manager I  Case Management Dept  [phone]

## 2022-08-22 NOTE — Consults (Signed)
[facility]  CONSULT  [id number]      Date Time: 08/22/22 1:03 PM  Patient Name: Shelby Barron  Requesting Physician: Regina Eck, MD  Consulting Physician: Threasa Alpha, MD, MD    Primary Care Physician: Patsy Lager, MD    Reason for Consultation: CRF      Assessment:   Problem List[1]    CRF -on HD currently at [facility]. Last HD 1 week ago  Anemia CKD  Deconditioning      Recommendations:     Permacath exchange tomorrow  HD tomorrow  NPO after MN  D/c back to [facility] after HD tomorrow  Aranesp tomorrow        Regina Eck, MD, thank you for this consultation.  We will follow the patient with you during this hospitalization.  Please contact me with any questions or issues.    Signed by: Threasa Alpha, MD, FASN   [facility]  [id number] (office)      -----------------------------------------------------------------------------------------------------------        History of Presenting Illness:   Shelby Barron is a 82 y.o. female who presented to the hospital with non-functional dialysis catheter and inability to receive HD. She had a prolonged hospitalization at [place]. She experienced significant decline there, but was able to be d/c'd to [facility]. However, her ports could not be aspirated and she has not received HD. Admitted for catheter exchange. [place] dwelling since admission.    Past Medical History:     Past Medical History:   Diagnosis Date    Acid reflux     Asthma, well controlled     Cancer of kidney     Chronic lower back pain     Congestive heart disease     COPD (chronic obstructive pulmonary disease)     CVA (cerebral infarction) 01/12/2003    Diabetes     Diabetic nephropathy     Fibroids     Gout     H/O: gout     Hyperlipidemia     Hypertension     Neuropathy of hand     SOB (shortness of breath)        Available old records reviewed, including:  old consults    Past Surgical History:     Past Surgical History:    Procedure Laterality Date    APPENDECTOMY (OPEN)      CLOSURE, ENTEROTOMY, SMALL INTESTINE  06/28/2022    Procedure: CLOSURE, ENTEROTOMY, SMALL INTESTINE;  Surgeon: Marlaine Hind, MD;  Location: [facility];  Service: General;;    CYSTECTOMY, ILEOCONDUIT N/A 06/28/2022    Procedure: CYSTECTOMY, RADICAL;  Surgeon: Neldon Newport, MD;  Location: [facility];  Service: Urology;  Laterality: N/A;    DRAIN (OTHER) N/A 05/31/2022    Procedure: DRAIN (OTHER);  Surgeon: Hope Pigeon, MD;  Location: [facility];  Service: Interventional Radiology;  Laterality: N/A;    EXPLORATORY LAPAROTOMY N/A 06/30/2022    Procedure: SMALL BOWEL ANASTOMOSIS, CLOSURE OF MESENTERIC DEFECT, CLOSURE OF ABDOMINAL WALL;  Surgeon: Marlaine Hind, MD;  Location: [facility];  Service: General;  Laterality: N/A;  **[id number]/ MD AVAIL [id number] OR AFTER 1600**    EXPLORATORY LAPAROTOMY, RESECTION SMALL BOWEL N/A 06/28/2022    Procedure: RESECTION SMALL BOWEL;  Surgeon: Marlaine Hind, MD;  Location: [facility];  Service: General;  Laterality: N/A;    HERNIA REPAIR      HYSTERECTOMY      LAPAROTOMY, NEPHRECTOMY Bilateral 06/28/2022  Procedure: BILATERAL OPEN RADICAL NEPHRECTOMY AND LYSIS OF ASHESION;  Surgeon: Neldon Newport, MD;  Location: [facility];  Service: Urology;  Laterality: Bilateral;    PARTIAL NEPHRECTOMY      tumor removed from kidney  cJanuary 2015    TUNNELED CATH PLACEMENT (PERMCATH) N/A 06/01/2022    Procedure: TUNNELED CATH PLACEMENT;  Surgeon: Suszanne Finch, MD;  [facility]: [facility];  Service: Interventional Radiology;  Laterality: N/A;    TUNNELED CATH PLACEMENT (PERMCATH) Right 07/14/2022    Procedure: TUNNELED CATH PLACEMENT;  Surgeon: Hope Pigeon, MD;  [facility];  Service: Interventional Radiology;  Laterality: Right;  right IJ tunneled HD catheter placement    TUNNELED CATH REMOVAL (PERMCATH) Right 07/14/2022    Procedure: TUNNELED CATH REMOVAL;  Surgeon: Hope Pigeon, MD;  Location:  [facility];  Service: Interventional Radiology;  Laterality: Right;  right neck/chest tunneled HD catheter removal    TURBT      WOUND VAC APPLICATION N/A 06/28/2022    Procedure: Tawni Pummel WOUND VAC APPLICATION;  Surgeon: Marlaine Hind, MD;  [facility]: [facility];  Service: General;  Laterality: N/A;    WOUND VAC APPLICATION N/A 06/30/2022    Procedure: WOUND VAC APPLICATION;  Surgeon: Marlaine Hind, MD;  [facility]: [facility];  Service: General;  Laterality: N/A;       Family History:     Family History   Problem Relation Age of Onset    Cancer Mother     Heart failure Father     Cancer Maternal Aunt     Diabetes Maternal Aunt     Cancer Paternal Aunt        Social History:     Social History     Socioeconomic History    Marital status: Married     Spouse name: Not on file    Number of children: Not on file    Years of education: Not on file    Highest education level: Not on file   Occupational History    Not on file   Tobacco Use    Smoking status: Former    Smokeless tobacco: Never   Vaping Use    Vaping status: Never Used   Substance and Sexual Activity    Alcohol use: No    Drug use: No    Sexual activity: Not Currently     Partners: Male   Other Topics Concern    Not on file   Social History Narrative    Not on file     Social Determinants of Health     Financial Resource Strain: Patient Unable To Answer (08/17/2022)    Overall Financial Resource Strain (CARDIA)     Difficulty of Paying Living Expenses: Patient unable to answer   Food Insecurity: No Food Insecurity (05/26/2022)    Hunger Vital Sign     Worried About Running Out of Food in the Last Year: Never true     Ran Out of Food in the Last Year: Never true   Transportation Needs: Patient Unable To Answer (08/17/2022)    PRAPARE - Transportation     Lack of Transportation (Medical): Patient unable to answer     Lack of Transportation (Non-Medical): Patient unable to answer   Physical Activity: Patient Unable To Answer (08/17/2022)    Exercise Vital Sign     Days  of Exercise per Week: Patient unable to answer     Minutes of Exercise per Session: Patient unable to answer  Stress: Patient Unable To Answer (08/17/2022)    [employer] of Occupational Health - Occupational Stress Questionnaire     Feeling of Stress : Patient unable to answer   Social Connections: Patient Unable To Answer (08/17/2022)    Social Connection and Isolation Panel [NHANES]     Frequency of Communication with Friends and Family: Patient unable to answer     Frequency of Social Gatherings with Friends and Family: Patient unable to answer     Attends Religious Services: Patient unable to answer     Active Member of Clubs or Organizations: Patient unable to answer     Attends Banker Meetings: Patient unable to answer     Marital Status: Patient unable to answer   Intimate Partner Violence: Not At Risk (05/26/2022)    Humiliation, Afraid, Rape, and Kick questionnaire     Fear of Current or Ex-Partner: No     Emotionally Abused: No     Physically Abused: No     Sexually Abused: No   Housing Stability: Patient Unable To Answer (08/17/2022)    Housing Stability Vital Sign     Unable to Pay for Housing in the Last Year: Patient unable to answer     Number of Times Moved in the Last Year: Not on file     Homeless in the Last Year: Patient unable to answer       Allergies:   Allergies[2]    Medications:     Medications Prior to Admission   Medication Sig    acetaminophen (TYLENOL) 500 MG tablet Take 2 tablets (1,000 mg) by mouth 3 (three) times daily    amiodarone (PACERONE) 200 MG tablet Take 1 tablet (200 mg) by mouth daily This is a maintenance dose.    apixaban (ELIQUIS) 2.5 MG Take 1 tablet (2.5 mg) by mouth every 12 (twelve) hours    atorvastatin (LIPITOR) 40 MG tablet Take 1 tablet (40 mg) by mouth daily    b complex-vitamin c-folic acid (NEPHRO-VITE) 0.8 MG Tab Take 1 tablet by mouth daily    carvedilol (COREG) 12.5 MG tablet Take 1 tablet (12.5 mg) by mouth every 12 (twelve) hours     cinacalcet (SENSIPAR) 30 MG tablet Take 1 tablet (30 mg) by mouth every evening    conjugated estrogens (PREMARIN) vaginal cream Place vaginally every other day At bedtime    megestrol (MEGACE) 40 MG tablet Take 1 tablet (40 mg) by mouth 4 (four) times daily    megestrol acetate (MEGACE) 400 MG/10ML Suspension Take 10 mLs (400 mg) by mouth daily    midodrine (PROAMATINE) 10 MG tablet Take 1 tablet (10 mg) by mouth Once daily every Tuesday, Thursday and Saturday morning (Patient taking differently: Take 1 tablet (10 mg) by mouth Once daily every Tuesday, Thursday and Saturday morning Hold for SBP > 140)    midodrine (PROAMATINE) 5 MG tablet Take 1 tablet (5 mg) by mouth 2 (two) times daily with meals    montelukast (SINGULAIR) 10 MG tablet Take 1 tablet (10 mg) by mouth nightly    ondansetron (Zofran) 4 MG tablet Take 1 tablet (4 mg) by mouth every 8 (eight) hours as needed for Nausea    oxybutynin XL (DITROPAN-XL) 5 MG 24 hr tablet Take 1 tablet (5 mg) by mouth daily    pantoprazole (PROTONIX) 20 MG tablet Take 1 tablet (20 mg) by mouth every 12 (twelve) hours    polyethylene glycol (MIRALAX) 17 g packet Take 17 g by mouth  daily as needed (constipation)    polyethylene glycol (MIRALAX) 17 g packet Take 17 g by mouth daily    sevelamer carbonate (RENVELA) 800 MG tablet Take 1 tablet (800 mg) by mouth 3 (three) times daily with meals    traMADol (ULTRAM) 50 MG tablet Take 1 tablet (50 mg) by mouth every 8 (eight) hours as needed for Pain    umeclidinium-vilanterol (Anoro Ellipta) 62.5-25 MCG/ACT Aerosol Pwdr, Breath Activated Inhale 1 puff into the lungs daily        Scheduled Meds: PRN Meds:    amiodarone, 200 mg, Oral, Daily  apixaban, 2.5 mg, Oral, Q12H SCH  atorvastatin, 40 mg, Oral, Daily  b complex-vitamin c-folic acid, 1 tablet, Oral, Daily  carvedilol, 12.5 mg, Oral, Q12H SCH  cinacalcet, 30 mg, Oral, QPM  midodrine, 5 mg, Oral, BID Meals  montelukast, 10 mg, Oral, QHS  oxybutynin XL, 5 mg, Oral,  Daily  pantoprazole, 40 mg, Oral, Daily  sevelamer carbonate, 800 mg, Oral, TID MEALS  umeclidinium-vilanterol, 1 puff, Inhalation, QAM           acetaminophen, 650 mg, Q4H PRN  benzocaine-menthol, 1 lozenge, Q2H PRN  benzonatate, 100 mg, TID PRN  carboxymethylcellulose sodium, 1 drop, TID PRN  dextrose, 15 g of glucose, PRN   Or  dextrose, 12.5 g, PRN   Or  dextrose, 12.5 g, PRN   Or  glucagon (rDNA), 1 mg, PRN  melatonin, 3 mg, QHS PRN  naloxone, 0.2 mg, PRN  oxyCODONE, 2.5 mg, Q6H PRN  polyethylene glycol, 17 g, Daily PRN  saline, 2 spray, Q4H PRN            Review of Systems:   A comprehensive review of systems was per the HPI and below:   Cannot be obtained; slow to respond  General ROS: no f/c, no weight changes  HEENT ROS: no blurry vision, no oral lesions, no epistaxis  Allergy/Immunology ROS: no new allergic reactions  Hematological and Lymphatic ROS: no known bleeding/clotting disorders  Respiratory ROS: negative for cough, shortness of breath, or wheezing  Cardiovascular ROS: negative for chest pain or dyspnea on exertion  Gastrointestinal ROS: negative for abd/flank pain, change in bowel habits  Genito-Urinary ROS: negative for dysuria, hematuria, difficulty voiding or nocturia  Musculoskeletal ROS:  negative for trauma or falls, arthralgias  Neurological ROS: no focal weakness, no dizziness  Endocrine ROS: no change in libido, no change in hair distribution  Dermatological ROS: no new rashes or lesions      Physical Exam:     Vitals:    08/22/22 0332 08/22/22 0600 08/22/22 0800 08/22/22 1000   BP:   147/67 153/71   Pulse:   78 79   Resp:   18 17   Temp: 98 F (36.7 C)  97.3 F (36.3 C) 97.6 F (36.4 C)   TempSrc: Axillary  Oral Oral   SpO2:   94% 95%   Weight:  82.2 kg (181 lb 3.5 oz)         Intake and Output Summary (Last 24 hours) at Date Time  No intake or output data in the 24 hours ending 08/22/22 1303    Recent weights:      08/14/2022 08/15/2022 08/16/2022 08/17/2022 08/18/2022 08/20/2022 08/22/2022    Weight Monitoring   Height      165.1 cm    Height Method      Estimated    Weight 81.6 kg 82.7 kg 85.4 kg 81.2 kg 81.2 kg 85 kg  82.2 kg   Weight Method Bed Scale Bed Scale  Bed Scale  Estimated Bed Scale   BMI (calculated) 32.9 kg/m2 33.3 kg/m2 34.4 kg/m2 32.7 kg/m2 32.7 kg/m2 31.2 kg/m2           General: chronically ill appearing; slow speech, not oriented  HEENT: sclera anicteric, oropharynx clear without lesions, mucous membranes moist  Neck: supple, no JVD  Cardiovascular: regular rate and rhythm, no murmurs, rubs or gallops  Lungs: clear to auscultation bilaterally, without wheezing, rhonchi, or rales  Abdomen: soft, non-tender, non-distended  Extremities: no clubbing, cyanosis, or edema  Neuro: A+O x 3, no gross motor/sensory deficits  Skin: no rashes or lesions noted      Labs:     Recent Labs   Lab 08/22/22  0333 08/21/22  1424 08/20/22  1640   WBC 16.64* 16.33* 15.25*   Hemoglobin 8.4* 8.9* 9.7*   Hematocrit 25.8* 27.4* 30.5*   Platelet Count 457* 442* 446*     Recent Labs   Lab 08/22/22  0333 08/21/22  1424 08/20/22  1640 08/18/22  0230 08/17/22  0441   Sodium 141 139 137 141 140   Potassium 4.5 4.8 5.2 4.1 4.9   Chloride 98* 100 100 102 101   CO2 23 23 23 24 23    BUN 61* 52* 42* 18 39*   Creatinine 6.9* 6.6* 5.3* 2.7* 4.8*   Calcium 9.0 9.1 10.0 8.6 8.9   Albumin  --  1.6*  --  1.9* 1.4*   Phosphorus  --  4.3  --  2.3 2.9   Magnesium 2.1 2.0  --  1.7 1.9   Glucose 60* 76 82 128* 88   GFR 5.4* 5.7* 7.5* 16.8* 8.4*             Invalid input(s): "LEUKOCYTESUR"        Imaging personally reviewed, including: CXR      cc: Regina Eck, MD  Pcp, [place], MD               [1]   Patient Active Problem List  Diagnosis    Type 2 diabetes mellitus with kidney complication, with long-term current use of insulin    Diabetes mellitus type II, uncontrolled    Neuropathy of hand    Left renal mass    Hamstring injury    Peritonitis    ESRD (end stage renal disease)    Chronic diastolic congestive heart failure     Aortic atherosclerosis    Malignant neoplasm of urinary bladder, unspecified site    Malignant tumor of kidney, left    Benign essential hypertension    GERD (gastroesophageal reflux disease)    Leukocytosis    Bladder cancer    Anemia    COPD (chronic obstructive pulmonary disease)    Hemodialysis catheter dysfunction, initial encounter   [2]   Allergies  Allergen Reactions    Metrizamide Shortness Of Breath and Nausea Only    Rosuvastatin Other (See Comments)     myopathy    myopathy   myopathy    myopathy myopathy      myopathy    myopathy, myopathy    Iodinated Contrast Media Other (See Comments)     Nausea and SOB per patient   On Dialysisi    Nausea and SOB per patient    Nausea and SOB per patient      Nausea and SOB per patient   Nausea and SOB per patient    Nausea and  SOB per patient      Nausea and SOB per patient Nausea and SOB per patient      Nausea and SOB per patient Nausea and SOB per patient Nausea and SOB per patient      Nausea and SOB per patient  Nausea and SOB per patient Nausea and SOB per patient    Nausea and SOB per patient   Nausea and SOB per patient    Pioglitazone      Weight gain    Sulfa Antibiotics Hives     blister

## 2022-08-22 NOTE — SLP Eval Note (Signed)
[facility]   Speech and Language Therapy Swallow Evaluation     Attention Physicians: For patients in an observation or outpatient status, Medicare requires the ordering provider to certify that this service is medically necessary.  Please co-sign this evaluation to indicate your agreement.  Thank you.    Patient: Shelby Barron    MRN#: [id number]   Consult received for Shelby Barron for SLP Swallow Evaluation and Treatment.    Recommendations:   Solids:  Minced and Moist (IDDSI MM5)  Liquids: Thin Liquids (IDDSI TN0) cup, straw  Meds: PO, crushed, with puree (IDDSI level 4)     Precautions/Compensations: elevate HOB, suction PRN, frequent oral care, alert and awake, upright positioning, alternate bites and sips, check for buccal pocketing on right, oral care before and after meals, eat and drink slowly  Supervision: Feeding assistance needed    Referrals: Please consider ordering a Cognitive/Speech-language evaluation by SLP      Plan of care: begin/continue oral diet, precautions and strategy training, dysphagia treatment, and patient/family education  Assessment:       Clinical signs and symptoms of oral dysphagia, likely subacute related to stroke in 06/2022. Pt with prolonged oral preparation and right oral residue cleared with multiple liquid washes and cued lingual sweep. Swallow response appears timely at bedside. Pt without overt s/sx of aspiration and O2 saturation remained 95-96% across trials.    Unclear if pt has received formal cognitive-linguistic evaluation s/p stroke. Would recommend this be completed while here if status becomes inpatient or at next level of care should she remain observation to determine candidacy for cognitive-communication rehab.     Education:   Risks and benefits of evaluation discussed with RN. Educated on role of speech therapy, plan of care, and goals of therapy and in agreement with POC. Educated on recommended diet, oral infection control, risks of  aspiration, and s/s of aspiration.     Evaluation:   Medical Diagnosis: Benign essential hypertension [I10]  Encephalopathy [G93.40]  ESRD (end stage renal disease) [N18.6]  Dependence on hemodialysis [Z99.2]  Chronic diastolic congestive heart failure [I50.32]  Hemodialysis catheter dysfunction, initial encounter [T82.41XA]  Chronic obstructive pulmonary disease, unspecified COPD type [J44.9]  Type 2 diabetes mellitus with chronic kidney disease on chronic dialysis, with long-term current use of insulin [E11.22, N18.6, Z99.2, Z79.4]  Therapy Diagnosis: oral dysphagia, cognitive deficits, and communication impairment    History of Present Illness: Shelby Barron is a 82 y.o. female admitted on 08/21/2022 with clogged HD catheter.     Past Medical/Surgical History:  Problem List[1]  Medical History[2]   Past Surgical History:   Procedure Laterality Date    APPENDECTOMY (OPEN)      CLOSURE, ENTEROTOMY, SMALL INTESTINE  06/28/2022    Procedure: CLOSURE, ENTEROTOMY, SMALL INTESTINE;  Surgeon: Marlaine Hind, MD;  [facility];  Service: General;;    CYSTECTOMY, ILEOCONDUIT N/A 06/28/2022    Procedure: CYSTECTOMY, RADICAL;  Surgeon: Neldon Newport, MD;  [facility];  Service: Urology;  Laterality: N/A;    DRAIN (OTHER) N/A 05/31/2022    Procedure: DRAIN (OTHER);  Surgeon: Hope Pigeon, MD;  [facility];  Service: Interventional Radiology;  Laterality: N/A;    EXPLORATORY LAPAROTOMY N/A 06/30/2022    Procedure: SMALL BOWEL ANASTOMOSIS, CLOSURE OF MESENTERIC DEFECT, CLOSURE OF ABDOMINAL WALL;  Surgeon: Marlaine Hind, MD;  [facility];  Service: General;  Laterality: N/A;  **IP-2910.01/ MD AVAIL [id number] OR AFTER 1600**  EXPLORATORY LAPAROTOMY, RESECTION SMALL BOWEL N/A 06/28/2022    Procedure: RESECTION SMALL BOWEL;  Surgeon: Marlaine Hind, MD;  [facility];  Service: General;  Laterality: N/A;    HERNIA REPAIR      HYSTERECTOMY      LAPAROTOMY, NEPHRECTOMY  Bilateral 06/28/2022    Procedure: BILATERAL OPEN RADICAL NEPHRECTOMY AND LYSIS OF ASHESION;  Surgeon: Neldon Newport, MD;  [facility];  Service: Urology;  Laterality: Bilateral;    PARTIAL NEPHRECTOMY      tumor removed from kidney  cJanuary 2015    TUNNELED CATH PLACEMENT (PERMCATH) N/A 06/01/2022    Procedure: TUNNELED CATH PLACEMENT;  Surgeon: Suszanne Finch, MD;  [facility];  Service: Interventional Radiology;  Laterality: N/A;    TUNNELED CATH PLACEMENT (PERMCATH) Right 07/14/2022    Procedure: TUNNELED CATH PLACEMENT;  Surgeon: Hope Pigeon, MD;  [facility];  Service: Interventional Radiology;  Laterality: Right;  right IJ tunneled HD catheter placement    TUNNELED CATH REMOVAL (PERMCATH) Right 07/14/2022    Procedure: TUNNELED CATH REMOVAL;  Surgeon: Hope Pigeon, MD;  [facility];  Service: Interventional Radiology;  Laterality: Right;  right neck/chest tunneled HD catheter removal    TURBT      WOUND VAC APPLICATION N/A 06/28/2022    Procedure: Tawni Pummel WOUND VAC APPLICATION;  Surgeon: Marlaine Hind, MD;  [facility];  Service: General;  Laterality: N/A;    WOUND VAC APPLICATION N/A 06/30/2022    Procedure: WOUND VAC APPLICATION;  Surgeon: Marlaine Hind, MD;  [facility];  Service: General;  Laterality: N/A;       Imaging:   XR Chest  AP Portable  Result Date: 08/21/2022  No significant change mild pulmonary vascular congestion and mild basal atelectasis. Hiatal hernia. Clide Cliff, MD 08/21/2022 2:35 PM    XR Chest 2 Views  Result Date: 08/20/2022   Increased interstitial markings likely edema with probable trace left effusion. Rocky Crafts, MD 08/20/2022 4:12 PM      Speech Therapy or Relevant Medical History:Previous clinical (bedside) swallow evaluation  Neuro related history: hx stroke    History of Intubation: Not intubated this admission    Baseline diet prior to admission: puree, mildly thick liquids    Nursing Dysphagia Screen:  pass    Subjective:   Interpreter utilized: no, not indicated     Patient is agreeable to participation in the therapy session. Patient's medical condition is appropriate for Speech therapy intervention at this time. No family at bedside.     PAIN: no response     Behavior/cognition: awake, alert, calm , cooperative, and follows simple commands    Orientation: oriented to person (name)    Objective:   Current diet: Pureed solids (PU4) and Mildly Thick liquids (MT2)  Position: in bed and upright   Medical equipment in place: telemetry and IV   Respiratory status: room air  Precautions: fall and bleeding    Oral motor exam: UTA  not following commands, pt able to complete lingual sweep with verbal cues and repetition  Speech:  limited verbal expression. 2-3 word phrases max with majority of verbalizations single word responses    Vocal Quality: clear vocal quality, judged severity: n/a     Oral Inspection:   Lips: moist/pink  Tongue: moist/pink  Saliva: WFL  Teeth: WFL    IDDSI oral trials:  Thin   (Th0)  via Cup Edge, Straw Oral: adequate labial seal, inconsistent bite response  to straw/spoon presentations, adequate suck/swallow from straw when engaged in intake; no anterior spillage; unable to coordinate taking from edge of cup    Pharyngeal: no overt signs or symptoms of aspiration     Amount: 4 oz, some single sips, multiple consecutive straw sips     Puree   (PU4)   Oral: adequate labial seal and spoon strip, occasional bite on spoon, slow/reduced oral manipulation, prolonged a/p transit  lingual residue, right buccal residue cleared with multiple liquid washes and cued lingual sweep    Pharyngeal: no overt signs or symptoms of aspiration     Amount: 2 oz     Regular Solids   (RG7)   Oral: prolonged but effective mastication, slow/reduced oral manipulation, prolonged a/p transit, lingual residue  right buccal residue cleared with oral swab as liquid wash and lingual sweep uneffective     Pharyngeal: no overt  signs or symptoms of aspiration     Amount: graham cracker     Esophageal Phase: unremarkable    Goals:     Goals  Patient will use lingual sweep to clear buccal cavity to reduce residue from oral cavity during p.o. intake: with minimal verbal instruction, in 5 sessions  Patient will demonstrate adequate oropharyngeal phase skills for a: Thin Liquids (IDDSI TN0), Soft and bite Sized (IDDSI SB6)    Patient left with call bell within reach, all needs met, bed alarm activated, and all questions answered. RN, Everlean Alstrom, notified via secure chat of session outcome and patient response including results and recommendations of dysphagia evaluation including but not limited to current functional status, amount of oral intake, level of supervision for oral intake, medication administration, diet and POC.    Treatment Time: Start Time: 1220 to Stop Time: 1240    Signature: Sid Falcon, M.S. [place]  [phone]      For Reference: Hospital Approved Diet Consistency Selection Protocol    Overall Assessment of Patient  Symptoms Observed Diet Consistency to be Ordered   Level of alertness not safe for po intake NPO *Must place call to provider    Clinical signs/symptoms of aspiration with all textures NPO *Must place call to provider     Assessment of Liquids  Symptoms Observed Liquid Consistency to be Ordered   No difficulty with liquids    Clinical signs/symptoms of aspiration with thin liquid Thin   Clinical signs/symptoms of aspiration with nectar liquid Mildly Thick   Clinical signs/symptoms of aspiration with honey liquid Moderately Thick     Assessment of Solids:  Symptoms Observed Solid Consistency to be Ordered   No difficulty Regular   Difficulty with mastication of solids Soft and Bite Sized   Slow, but effective mastication of soft solids; and difficulty with harder solids Minced and Moist   Oral residuals present, unable to masticate soft solids Puree      Special Considerations:   When the patient has past  medical history which includes chronic conditions with dietary considerations such as diabetes mellitus, congestive heart failure or renal failure, the SLP will discuss/verify additional dietary restrictions with registered dietician or nurse.   When palliative care is involved, the recommended consistencies vs. patient wishes will be discussed with the care team.      Follow-up: Recommended consistencies are re-assessed frequently during the patient's hospitalization as the patient's condition and/or other factors influencing the recommendations may change.            [1]   Patient Active Problem List  Diagnosis  Type 2 diabetes mellitus with kidney complication, with long-term current use of insulin    Diabetes mellitus type II, uncontrolled    Neuropathy of hand    Left renal mass    Hamstring injury    Peritonitis    ESRD (end stage renal disease)    Chronic diastolic congestive heart failure    Aortic atherosclerosis    Malignant neoplasm of urinary bladder, unspecified site    Malignant tumor of kidney, left    Benign essential hypertension    GERD (gastroesophageal reflux disease)    Leukocytosis    Bladder cancer    Anemia    COPD (chronic obstructive pulmonary disease)    Hemodialysis catheter dysfunction, initial encounter   [2]   Past Medical History:  Diagnosis Date    Acid reflux     Asthma, well controlled     Cancer of kidney     Chronic lower back pain     Congestive heart disease     COPD (chronic obstructive pulmonary disease)     CVA (cerebral infarction) 01/12/2003    Diabetes     Diabetic nephropathy     Fibroids     Gout     H/O: gout     Hyperlipidemia     Hypertension     Neuropathy of hand     SOB (shortness of breath)

## 2022-08-22 NOTE — OT Eval Note (Signed)
Occupational Therapy Eval - [facility]    Post Acute Care Therapy Recommendations:   Discharge Recommendation: SNF    If above recommendation is not available pt will need 24 hour/2 person, max assist, HHOT, for safe discharge. 1 floor s/u    DME needs IF patient is discharging home: Hospital bed, [facility], [place], [facility]    Therapy discharge recommendation may change with patient status. Please refer to most recent note  for updated recommendations.    Evaluation:   Patient: Shelby Barron    Bed: Z610/R604.54   Time Calculation  OT Received On: 08/22/22  Start Time: 1000  Stop Time: 1032  Time Calculation (min): 32 min    Precautions and Contraindications:   Precautions  Other Precautions:  (falls)    Medical Diagnosis: Benign essential hypertension [I10]  Encephalopathy [G93.40]  ESRD (end stage renal disease) [N18.6]  Dependence on hemodialysis [Z99.2]  Chronic diastolic congestive heart failure [I50.32]  Hemodialysis catheter dysfunction, initial encounter [T82.41XA]  Chronic obstructive pulmonary disease, unspecified COPD type [J44.9]  Type 2 diabetes mellitus with chronic kidney disease on chronic dialysis, with long-term current use of insulin [E11.22, N18.6, Z99.2, Z79.4]      History of Present Illness: Shelby Barron is a 82 y.o. female admitted on 08/21/2022 with HD  port non functional. Recent d/c from [facility] for abdominal surgery and CVA. Admit from SNF, has been at SNF x2-3 days.    Problem List[1]   Medical History[2]  Past Surgical History:   Procedure Laterality Date    APPENDECTOMY (OPEN)      CLOSURE, ENTEROTOMY, SMALL INTESTINE  06/28/2022    Procedure: CLOSURE, ENTEROTOMY, SMALL INTESTINE;  Surgeon: Marlaine Hind, MD;  [facility];  Service: General;;    CYSTECTOMY, ILEOCONDUIT N/A 06/28/2022    Procedure: CYSTECTOMY, RADICAL;  Surgeon: Neldon Newport, MD;  [facility];  Service: Urology;  Laterality: N/A;    DRAIN (OTHER) N/A 05/31/2022    Procedure:  DRAIN (OTHER);  Surgeon: Hope Pigeon, MD;  [facility];  Service: Interventional Radiology;  Laterality: N/A;    EXPLORATORY LAPAROTOMY N/A 06/30/2022    Procedure: SMALL BOWEL ANASTOMOSIS, CLOSURE OF MESENTERIC DEFECT, CLOSURE OF ABDOMINAL WALL;  Surgeon: Marlaine Hind, MD;  [facility];  Service: General;  Laterality: N/A;  **IP-2910.01/ MD AVAIL [id number] OR AFTER 1600**    EXPLORATORY LAPAROTOMY, RESECTION SMALL BOWEL N/A 06/28/2022    Procedure: RESECTION SMALL BOWEL;  Surgeon: Marlaine Hind, MD;  [facility];  Service: General;  Laterality: N/A;    HERNIA REPAIR      HYSTERECTOMY      LAPAROTOMY, NEPHRECTOMY Bilateral 06/28/2022    Procedure: BILATERAL OPEN RADICAL NEPHRECTOMY AND LYSIS OF ASHESION;  Surgeon: Neldon Newport, MD;  [facility];  Service: Urology;  Laterality: Bilateral;    PARTIAL NEPHRECTOMY      tumor removed from kidney  cJanuary 2015    TUNNELED CATH PLACEMENT (PERMCATH) N/A 06/01/2022    Procedure: TUNNELED CATH PLACEMENT;  Surgeon: Suszanne Finch, MD;  [facility];  Service: Interventional Radiology;  Laterality: N/A;    TUNNELED CATH PLACEMENT (PERMCATH) Right 07/14/2022    Procedure: TUNNELED CATH PLACEMENT;  Surgeon: Hope Pigeon, MD;  [facility];  Service: Interventional Radiology;  Laterality: Right;  right IJ tunneled HD catheter placement    TUNNELED CATH REMOVAL (PERMCATH) Right 07/14/2022    Procedure: TUNNELED CATH REMOVAL;  Surgeon: Welton Flakes,  Dimitrios C, MD;  [facility];  Service: Interventional Radiology;  Laterality: Right;  right neck/chest tunneled HD catheter removal    TURBT      WOUND VAC APPLICATION N/A 06/28/2022    Procedure: Tawni Pummel WOUND VAC APPLICATION;  Surgeon: Marlaine Hind, MD;  [facility];  Service: General;  Laterality: N/A;    WOUND VAC APPLICATION N/A 06/30/2022    Procedure: WOUND VAC APPLICATION;  Surgeon: Marlaine Hind, MD;  [facility];  Service:  General;  Laterality: N/A;         Prior Level of Function/Social History:  Prior Level of Function  Prior level of function: Independent with ADLs;Ambulates independently (prior to stay of [facility] for abdominal surgery and CVA)  Baseline Activity Level: Community ambulation  DME Currently at Home: ADL- Grab Bars;ADL- Soil scientist, [employer] Living Arrangements  Living Arrangements: Spouse/significant other;Children  Type of Home: House  Home Layout: Multi-level;Bed/bath upstairs;Stairs to enter with rails (add number in comment) (full bath on main level. 9 STE[address] 2 + 6 stairs to upper level)  Bathroom Shower/Tub: Pension scheme manager: Standard  Bathroom Equipment: Grab bars in shower;Shower chair  DME Currently at Home: ADL- Grab Bars;ADL- Soil scientist, [employer]  Home Living - Notes / Comments:  (Home set up is daughter's town home. Pt was living in [facility] w/ husband prior to abdominal surgery. Came to [facility] for surgery.)    Subjective:   Patient is agreeable to participation in the therapy session. Nursing clears patient for therapy.      Pain Assessment  Pain Assessment: FLACC (Pt reports "high pain", does not provide number.)     Pain Assessment: FLACC (Pt reports "high pain", does not provide number.)    Objective:       Patient is in bed with telemetry, peripheral IV, and Abdominal wound vac, R perma cath and bilateral multi podus boots in place.    Vitals: stable    Current Level of Function:  Cognitive Status and Neuro Exam:  Cognition/Neuro Status  Arousal/Alertness: Appropriate responses to stimuli  Attention Span: Appears intact  Orientation Level: Oriented to person (Limited assessment as pt non verbal after questions regarding name and DOB)  Memory:  (Limited 2/2 pt non verbalizing)  Following Commands:  (Pt following simple commands w/ increased time. Limited 2/2 pt non verbalizing throughout session)  Safety Awareness: unable to assess  Insights: Fully aware of deficits  Problem  Solving: Unable to assess (Pt non verbalizing)                    Musculoskeletal Examination  Gross ROM  Right Upper Extremity ROM: within functional limits (AROM)  Left Upper Extremity ROM:  (PROM WFL. AROM not noted shoulder-->digits all plane. Flaccidity noted throughout)  Gross Strength  Right Upper Extremity Strength: 3/5 (> or =. Limited assessment 2/2 pt not engaging in session)  Left Upper Extremity Strength: 0/5                                  Sensory/Oculomotor Examination          Activities of Daily Living  Self-care and Home Management  Bathing: Dependent  UB Dressing: Maximal Assist  LB Dressing: Dependent  Toileting: Dependent    Functional Mobility:  Mobility and Transfers  Rolling: Dependent;to Right;to Left (using TAPs)  Scooting to HOB: Dependent (w/ Trendelenberg and TAPs)  PMP - Progressive Mobility Protocol   PMP Activity: Step 3 - Bed Mobility     Balance  Balance  Static Sitting Balance:  (not assessed, pt not engaging in OT session)  Dyanamic Sitting Balance:  (not assessed, pt not engaging in OT session)  Static Standing Balance:  (not assessed, pt not engaging in OT session)  Dynamic Standing Balance:  (not assessed, pt not engaging in OT session)    Participation and Activity Tolerance  Participation Effort: fair  Endurance: Tolerates 10 - 20 min exercise with multiple rests        Assessment:   Patient History - Expanded chart review required including reading through labs, imaging, vitals, and calling patient's family for thorough history.       Examination - Examination reveals impairments in the following body systems: Assessment: decreased strength;balance deficits;decreased independence with ADLs;decreased ROM;decreased safety awareness;decreased endurance/activity tolerance.      Clinical Decision Making - Detailed assessment(s) required and presence of a few comorbidities or other factors that affect plan of care requiring modification of task including falls risk, Chronic  weakness (L HP 2/2 CVA), decreased verbalization/participation during sessions, decreased safety awareness        Prognosis: Fair;With continued OT s/p acute discharge    Plan:   Treatment Interventions: ADL retraining;Patient/Family training;Equipment eval/education;Neuro muscular reeducation;Compensatory technique education;UE strengthening/ROM;Functional transfer training       OT Frequency Recommended: 4-5x/wk       Goals:      Goals  Goal Formulation: Patient  Time For Goal Achievement: 5 visits  Goals: Select goal  Patient will feed self: Independent;5 visits (after s/u)  Patient will groom self: Supervision;5 visits  Other Goal:  (Pt will complete supine<>sit w/ max assist in prep for EOB ADLs in 5 sessions.)  Pt will sit at edge of bed: Maximal Assist;for 3 minutes;to prepare for OOB tasks;5 visits       Education:   Educated patient/daughter (via phone) to role of occupational therapy, plan of care, home safety, next appropriate level of care, safety with mobility and ADLs.  Demonstrated fair understanding. Also discussed goals of therapy and are in agreement with plan.        Interdisciplinary Communication:   Patient is in bed w/ bed/chair alarm set, and call bell within reach. Communicated with PT and SLP via [id number] regarding pt status.            Signature: Gerre Couch, OTD, OTR/L         [1]   Patient Active Problem List  Diagnosis    Type 2 diabetes mellitus with kidney complication, with long-term current use of insulin    Diabetes mellitus type II, uncontrolled    Neuropathy of hand    Left renal mass    Hamstring injury    Peritonitis    ESRD (end stage renal disease)    Chronic diastolic congestive heart failure    Aortic atherosclerosis    Malignant neoplasm of urinary bladder, unspecified site    Malignant tumor of kidney, left    Benign essential hypertension    GERD (gastroesophageal reflux disease)    Leukocytosis    Bladder cancer    Anemia    COPD (chronic obstructive pulmonary disease)     Hemodialysis catheter dysfunction, initial encounter   [2]   Past Medical History:  Diagnosis Date    Acid reflux     Asthma, well controlled     Cancer of kidney     Chronic  lower back pain     Congestive heart disease     COPD (chronic obstructive pulmonary disease)     CVA (cerebral infarction) 01/12/2003    Diabetes     Diabetic nephropathy     Fibroids     Gout     H/O: gout     Hyperlipidemia     Hypertension     Neuropathy of hand     SOB (shortness of breath)

## 2022-08-22 NOTE — Progress Notes (Signed)
[facility]  HOSPITALIST  PROGRESS NOTE      Patient: Shelby Barron  Date: 08/22/2022   LOS: 0 Days  Admission Date: 08/21/2022   MRN: [id number]  Attending:  Regina Eck, MD     ASSESSMENT/PLAN     [name] is a 82 y.o. female admitted with:      1. Malfunctioning HD Catheter  2. [name], Hx. RCC S/P Bilateral Nephrectomy  Labs stable  IR [facility]) and Nephrology ([place]) consulting- S/P Cath-Flo x2  Plan for IR Permacath exchange on Monday 8/5  Plan for HD following above on Monday 8/5  Continue home meds     3. SIRS  SIRS present on admission, persistent leukocytosis noted from prior  CXR no infectious etiology, patient is anuric, no obvious signs of infection, monitor for now     4. Atrial Fibrillation  5. Chronic Diastolic CHF  6. HTN  7. HLD, Hx. CVA  8. AOCD, stable  9. Dysphagia  10. 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          Monitoring:  Telemetry needed: No  Continuous pulse oximetry needed: No  Level of Care: 4      Safety Checklist  DVT prophylaxis: Chemical and / or mechanical ppx NOT indicated or contraindicated: Fully anticoagulated    Foley: Not present   IVs:  Peripheral IV   PT/OT: Not needed   Daily CBC & or Chem ordered: Yes, due to clinical and lab instability       Patient Lines/Drains/Airways Status       Active PICC Line / CVC Line / PIV Line / Drain / Airway / Intraosseous Line / Epidural Line / ART Line / Line / Wound / Pressure Ulcer / NG/OG Tube       Name Placement date Placement time Site Days    Permacath Catheter - Tunneled 07/14/22 Subclavian vein catheter Right 07/14/22  1404  -- 38    Peripheral IV 20 G Standard Anterior;Right  Wrist --  --  Wrist  --    Wound 06/30/22 Surgical Incision Abdomen Other (Comment) WOUND VAC applied 06/30/22  1152  Abdomen  52    Wound 07/23/22 Skin Tear Buttocks;Coccyx Inner R and L cheek, coccyx 07/23/22  --  Buttocks;Coccyx  30                       Code Status: full code    DISPO: assisted living facility    Family Contact: As listed in medical chart    Care Plan discussed with nursing, consultants       SUBJECTIVE     Shelby Barron is seen resting comfortably, appears very sleepy. Patient tracking with eyes, but nonverbal and not following commands on exam.    MEDICATIONS     Current Facility-Administered Medications   Medication Dose Route Frequency    amiodarone  200 mg Oral Daily    apixaban  2.5 mg Oral Q12H SCH    atorvastatin  40 mg Oral Daily    b complex-vitamin c-folic acid  1 tablet Oral Daily  carvedilol  12.5 mg Oral Q12H [facility]    cinacalcet  30 mg Oral QPM    midodrine  5 mg Oral BID Meals    montelukast  10 mg Oral QHS    oxybutynin XL  5 mg Oral Daily    pantoprazole  40 mg Oral Daily    sevelamer carbonate  800 mg Oral TID MEALS    umeclidinium-vilanterol  1 puff Inhalation QAM       ROS     All other systems not mentioned in subjective reviewed and negative - unable to obtain    PHYSICAL EXAM     Vitals:    08/22/22 0332   BP:    Pulse:    Resp:    Temp: 98 F (36.7 C)   SpO2:        Temperature: Temp  Min: 97.3 F (36.3 C)  Max: 98.5 F (36.9 C)  Pulse: Pulse  Min: 91  Max: 96  Respiratory: Resp  Min: 16  Max: 21  Non-Invasive BP: BP  Min: 116/58  Max: 144/69  Pulse Oximetry SpO2  Min: 95 %  Max: 98 %    Intake and Output Summary (Last 24 hours) at Date Time  No intake or output data in the 24 hours ending 08/22/22 0801    GEN APPEARANCE: Elderly, Chronically ill appearing; No acute distress  HEENT: NC/AT; Conjunctiva Clear, Sclerae Anicteric; MM dry  NECK: Supple  CVS: RRR, S1, S2; No Murmur  LUNGS: CTAB; No Wheezes/Rhonchi/Rales  ABD: Soft; ND; No TTP; + Normoactive BS  EXT: No LE  edema  SKIN: No rash or lesions  NEURO:  Patient tracking with eyes, but nonverbal and not following commands on exam; No acute focal neurological deficits    LABS     Recent Labs   Lab 08/22/22  0333 08/21/22  1424 08/20/22  1640   WBC 16.64* 16.33* 15.25*   RBC 2.84* 3.03* 3.31*   Hemoglobin 8.4* 8.9* 9.7*   Hematocrit 25.8* 27.4* 30.5*   MCV 90.8 90.4 92.1   Platelet Count 457* 442* 446*       Recent Labs   Lab 08/22/22  0333 08/21/22  1424 08/20/22  1640 08/18/22  0230 08/17/22  0441 08/16/22  0351   Sodium 141 139 137 141 140 140   Potassium 4.5 4.8 5.2 4.1 4.9 4.7   Chloride 98* 100 100 102 101 101   CO2 23 23 23 24 23 24    BUN 61* 52* 42* 18 39* 26*   Creatinine 6.9* 6.6* 5.3* 2.7* 4.8* 3.6*   Glucose 60* 76 82 128* 88 119*   Calcium 9.0 9.1 10.0 8.6 8.9 8.9   Magnesium 2.1 2.0  --  1.7 1.9 1.9       Recent Labs   Lab 08/21/22  1424 08/18/22  0230 08/17/22  0441   ALT 13 10  --    AST (SGOT) 13 10  --    Bilirubin, Total 0.3 0.3  --    Albumin 1.6* 1.9* 1.4*   Alkaline Phosphatase 123* 108  --                    Microbiology Results (last 15 days)       Procedure Component Value Units Date/Time    Culture, Blood, Aerobic And Anaerobic [[id number]] Collected: 08/11/22 0208    Order Status: Completed Specimen: Blood, Venous Updated: 08/16/22 0700     Culture Blood No growth at 5  days    Culture, Blood, Aerobic And Anaerobic [[id number]] Collected: 08/11/22 0208    Order Status: Completed Specimen: Blood, Venous Updated: 08/16/22 0700     Culture Blood No growth at 5 days             RADIOLOGY     Radiological Procedure personally reviewed and concur with radiologist reports unless stated otherwise.    XR Chest  AP Portable   Final Result      No significant change mild pulmonary vascular congestion and mild basal   atelectasis. Hiatal hernia.      Clide Cliff, MD   08/21/2022 2:35 PM          Signed,  [name], PA-C  8:01 AM 08/22/2022

## 2022-08-22 NOTE — Progress Notes (Signed)
Unable to obtain patient's signature as patient is not able to participate. Telephonic acknowledgement received from daughter Kyrra Prada, [phone] and copy of the letter will be delivered to patient's room.    Belva Agee, CMS

## 2022-08-22 NOTE — UM Notes (Signed)
PATIENT NAME: Shelby Barron, Shelby Barron   DOB: 03-Oct-1940     Admission Order   Adult Admit to Observation (Order #[id number]) on Sep 09, 2022     Admit Diagnosis: Benign essential hypertension [I10]  Encephalopathy [G93.40]  ESRD (end stage renal disease) [N18.6]  Dependence on hemodialysis [Z99.2]  Chronic diastolic congestive heart failure [I50.32]  Hemodialysis catheter dysfunction, initial encounter [T82.41XA]  Chronic obstructive pulmonary disease, unspecified COPD type [J44.9]  Type 2 diabetes mellitus with chronic kidney disease on chronic dialysis, with long-term current use of insulin [E11.22, N18.6, Z99.2, Z79.4]  Facility: [facility] [address] [place]    Reason for Admission   Shelby Barron is a 82 y.o. female with a PMHx of [name], RCC S/P Bilateral Nephrectomy, AF on Eliquis, Urothelial CA S/P Cystectomy c/b Small Bowel Enterotomy S/P Small Bowel Anastomosis+Abdominal Closure+Wound Vac, Chronic Diastolic CHF, DM Type II, HTN, HLD, Hx. CVA (residual LUE flaccid paralysis), AOCD, and Gout who presented with Clogged HD Catheter, has not received HD since Tuesday.     Patient recently discharged to SNF after extensive complicated admission at [facility]. Her HD catheter has been clogged and non-functioning for the past 4 days, thus has not received dialysis since then. Nephrology evaluated her as outpatient, gave kayexalate and tried to manage as outpatient, however was unsuccessful so with worsening labs she was sent to the hospital for management. In the ED, VSS, labs WBC 16, Hgb 8.9, eGFR 5.7. IR and Nephrology consulted. Patient tracking with eyes, but nonverbal and not following commands on exam, daughter at bedside reports patient is not responding because she is upset about being back in the hospital, she has a caretaker named Jerrye Beavers who is the only person the patient will speak to and take medication from.    Vital Signs  On RA  Temp:  [97.3 F (36.3 C)-98.5 F (36.9 C)] 97.3 F (36.3 C)  Heart Rate:   [78-96] 78  Resp Rate:  [16-21] 18  BP: (116-147)/(56-70) 147/67    Medications  Scheduled Meds:  Current Facility-Administered Medications   Medication Dose Route Frequency    amiodarone  200 mg Oral Daily    apixaban  2.5 mg Oral Q12H SCH    atorvastatin  40 mg Oral Daily    b complex-vitamin c-folic acid  1 tablet Oral Daily    carvedilol  12.5 mg Oral Q12H [facility]    cinacalcet  30 mg Oral QPM    midodrine  5 mg Oral BID Meals    montelukast  10 mg Oral QHS    oxybutynin XL  5 mg Oral Daily    pantoprazole  40 mg Oral Daily    sevelamer carbonate  800 mg Oral TID MEALS    umeclidinium-vilanterol  1 puff Inhalation QAM     Abnormal Labs   09-Sep-2022 14:24 08/22/22 03:33   WBC 16.33 (H) 16.64 (H)   Hemoglobin 8.9 (L) 8.4 (L)   Hematocrit 27.4 (L) 25.8 (L)   Platelet Count 442 (H) 457 (H)   RBC 3.03 (L) 2.84 (L)   RDW 16 (H) 16 (H)   Instrument Absolute Neutrophil Count 10.66 (H)    Lymphocytes Absolute Automated 3.56 (H)    Monocytes Absolute Automated 1.76 (H)    Immature Granulocytes Absolute 0.27 (H)    Nucleated RBC 0.1 (H) 0.1 (H)   Neutrophils Absolute 10.66 (H)    Nucleated RBC Absolute 0.02 (H) 0.02 (H)   Glucose 76 60 (L)   BUN 52 (H)  61 (H)   Creatinine 6.6 (H) 6.9 (H)   Chloride 100 98 (L)   Anion Gap 16.0 (H) 20.0 (H)   EGFR 5.7 (L) 5.4 (L)   Alkaline Phosphatase 123 (H)    Albumin 1.6 (L)    Protein Total 4.8 (L)    Albumin/Globulin Ratio 0.5 (L)      Imaging   PCXR:  No significant change mild pulmonary vascular congestion and mild basal atelectasis. Hiatal hernia.    Plan of Care  1. Malfunctioning HD Catheter  2. [name], Hx. RCC S/P Bilateral Nephrectomy  Labs stable  IR [facility]) and Nephrology ([place]) consulting- S/P Cath-Flo x2, will attempt HD tonight, plan for IR exchange on Monday unless HD needed more urgently over weekend, contact IR to place groin quinton  Continue home meds     3. SIRS  SIRS present on admission, persistent leukocytosis noted from prior  CXR no infectious etiology,  patient is anuric, no obvious signs of infection, monitor for now     4. Atrial Fibrillation  5. Chronic Diastolic CHF  6. HTN  7. HLD, Hx. CVA  8. AOCD, stable  9. Dysphagia  10. 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

## 2022-08-22 NOTE — Progress Notes (Signed)
Admission Note-  Patient admitted to CDU room #  480 at 1723. Hand off report received from Prior RN Everlean Alstrom.    Patient oriented to room, bed in lowest position, bed alarm activated, call bell within reach.   Patient informed of visitor policy.  Belongings doc'd in epic.       Skin Assessment  Two nurse skin assessment performed with: RN Shefali  Skin Ok, healing sacral wounds and bruising- see pictures attached. Blanchable redness on heels.  Braden score <15. Yes    Actions taken: Pictures taken     Bony Prominences: Check appropriate box; if wound is present enter wound assessment in LDA     Occiput:                 [x] WNL  []  Wound present  Face:                     [x] WNL  []  Wound present  Ears:                      [x] WNL  []  Wound present  Spine:                    [x] WNL  []  Wound present  Shoulders:             [x] WNL  []  Wound present  Elbows:                  [x] WNL  []  Wound present  Sacrum/coccyx:     [] WNL  [x]  Wound present  Ischial Tuberosity:  [x] WNL  []  Wound present  Trochanter/Hip:      [x] WNL  []  Wound present  Knees:                   [x] WNL  []  Wound present  Ankles:                   [x] WNL  []  Wound present  Heels:                    [x] WNL  []  Wound present         Device related: []  Device name:         LDA completed if wound present: Y  [employer] (if necessary): Y (if necessary)                                          Other skin related issues, ie tears, rash, etc, document in Integumentary flowsheet

## 2022-08-22 NOTE — Plan of Care (Addendum)
Pt. Is Alert with eyes open spontaneously, responding to some questions, patient appears to be at mental baseline per caregiver at bedside. Pt. Follows commands by opening mouth on command for medications and moves RUE spontaneously in response to painful stimuli. Provided analgesia prior to ABD dressing changed with one time dose of IV dilaudid for analgesia with adequate pain control throughout dressing change. Pt. Is on room air, lungs CTA, NSR on monitor. Aspiration precautions in place, pt. Is feeder assist. SLP evaluated during day and provided recommendations. CHG bath done, pt. Being turned and repositioned Q2 hours, bruising noted to RUE, on eliquis. Updated daughter [name] over the phone regarding plan of care and patient's status. Wound photos done this shift noted below, wet to dry dressing done with removal of SNF facility wound vac dressing and device.       Media Information      Document Information  ABD with wound VAC removed and cleansed prior to dressing  Photographic Image: Photograph      08/22/2022 22:14   Attached To:   Hospital Encounter on 08/21/22   Source Information    Sheliah Hatch, RN  [facility]   Document History        Problem: Compromised Sensory Perception  Goal: Sensory Perception Interventions  Outcome: Progressing  Flowsheets (Taken 08/22/2022 0800 by Terrance Mass, RN)  Sensory Perception Interventions: Offload heels, Pad bony prominences, Reposition q 2hrs/turn Clock, Q2 hour skin assessment under devices if present     Problem: Compromised Moisture  Goal: Moisture level Interventions  Outcome: Progressing  Flowsheets (Taken 08/22/2022 0800 by Terrance Mass, RN)  Moisture level Interventions: Moisture wicking products, Moisture barrier cream     Problem: Renal Instability  Goal: Fluid and electrolyte balance are achieved/maintained  Outcome: Progressing  Flowsheets (Taken 08/21/2022 2306 by Fanny Bien, Journey, RN)  Fluid and electrolyte balance are  achieved/maintained:   Monitor/assess lab values and report abnormal values   Observe for cardiac arrhythmias   Monitor for muscle weakness   Assess and reassess fluid and electrolyte status   Follow fluid restrictions/IV/PO parameters     Problem: Impaired Mobility  Goal: Mobility/Activity is maintained at optimal level for patient  Outcome: Progressing  Flowsheets (Taken 08/21/2022 1832 by Illene Regulus A, RN)  Mobility/activity is maintained at optimal level for patient:   Encourage independent activity per ability   Consult/collaborate with Physical Therapy and/or Occupational Therapy     Problem: Peripheral Neurovascular Impairment  Goal: Extremity color, movement, sensation are maintained or improved  Outcome: Progressing  Flowsheets (Taken 08/21/2022 1832 by Illene Regulus A, RN)  Extremity color, movement, sensation are maintained or improved:   Assess and monitor application of corrective devices (cast, brace, splint), check skin integrity   Assess extremity for proper alignment

## 2022-08-23 ENCOUNTER — Encounter: Admission: EM | Disposition: E | Payer: Self-pay | Source: Home / Self Care | Attending: Internal Medicine

## 2022-08-23 HISTORY — PX: TUNNELED CATH CHECK/CHANGE (PERMCATH): IMG2663

## 2022-08-23 LAB — RENAL FUNCTION PANEL
Albumin: 1.5 g/dL — ABNORMAL LOW (ref 3.5–5.0)
Anion Gap: 18 — ABNORMAL HIGH (ref 5.0–15.0)
BUN: 67 mg/dL — ABNORMAL HIGH (ref 7–21)
CO2: 22 mEq/L (ref 17–29)
Calcium: 8.4 mg/dL (ref 7.9–10.2)
Chloride: 100 mEq/L (ref 99–111)
Creatinine: 8 mg/dL — ABNORMAL HIGH (ref 0.4–1.0)
GFR: 4.6 mL/min/{1.73_m2} — ABNORMAL LOW (ref >=60.0)
Glucose: 73 mg/dL (ref 70–100)
Phosphorus: 5.1 mg/dL — ABNORMAL HIGH (ref 2.3–4.7)
Potassium: 5.1 mEq/L (ref 3.5–5.3)
Sodium: 140 mEq/L (ref 135–145)

## 2022-08-23 LAB — APTT: PTT: 30 s (ref 27–39)

## 2022-08-23 LAB — CBC
Absolute nRBC: 0 10*3/uL (ref <=0.00)
Hematocrit: 25.8 % — ABNORMAL LOW (ref 34.7–43.7)
Hemoglobin: 8.3 g/dL — ABNORMAL LOW (ref 11.4–14.8)
MCH: 29.6 pg (ref 25.1–33.5)
MCHC: 32.2 g/dL (ref 31.5–35.8)
MCV: 92.1 fL (ref 78.0–96.0)
MPV: 9.1 fL (ref 8.9–12.5)
Platelet Count: 433 10*3/uL — ABNORMAL HIGH (ref 142–346)
RBC: 2.8 10*6/uL — ABNORMAL LOW (ref 3.90–5.10)
RDW: 16 % — ABNORMAL HIGH (ref 11–15)
WBC: 17.19 10*3/uL — ABNORMAL HIGH (ref 3.10–9.50)
nRBC %: 0 /100 WBC (ref <=0.0)

## 2022-08-23 LAB — WHOLE BLOOD GLUCOSE POCT
Whole Blood Glucose POCT: 76 mg/dL (ref 70–100)
Whole Blood Glucose POCT: 76 mg/dL (ref 70–100)
Whole Blood Glucose POCT: 95 mg/dL (ref 70–100)

## 2022-08-23 LAB — PT/INR
INR: 1.7 (ref 0.9–1.1)
PT: 19.8 s — ABNORMAL HIGH (ref 10.1–12.9)

## 2022-08-23 LAB — MAGNESIUM: Magnesium: 2 mg/dL (ref 1.6–2.6)

## 2022-08-23 SURGERY — TUNNELED CATH CHECK/CHANGE
Anesthesia: IV Sedation | Site: Neck | Laterality: Right

## 2022-08-23 MED ORDER — LIDOCAINE 1% BUFFERED - CNR/OUTSOURCED
INTRAMUSCULAR | Status: AC | PRN
Start: 2022-08-23 — End: 2022-08-23
  Administered 2022-08-23: 10 mL via INTRADERMAL

## 2022-08-23 MED ORDER — ALBUMIN HUMAN 25 % IV SOLN
100.0000 mL | INTRAVENOUS | Status: AC | PRN
Start: 2022-08-23 — End: 2022-08-23

## 2022-08-23 MED ORDER — HEPARIN (PORCINE) IN D5W 50-5 UNIT/ML-% IV SOLN (UNITS/KG/HR ONLY)
12.0000 [IU]/kg/h | INTRAVENOUS | Status: DC
Start: 2022-08-23 — End: 2022-08-25
  Administered 2022-08-23: 12 [IU]/kg/h via INTRAVENOUS
  Administered 2022-08-24: 8 [IU]/kg/h via INTRAVENOUS
  Filled 2022-08-23 (×2): qty 500

## 2022-08-23 MED ORDER — ONDANSETRON HCL 4 MG/2ML IJ SOLN
INTRAMUSCULAR | Status: AC
Start: 2022-08-23 — End: ?
  Filled 2022-08-23: qty 2

## 2022-08-23 MED ORDER — MIDAZOLAM HCL 1 MG/ML IJ SOLN (WRAP)
INTRAMUSCULAR | Status: AC
Start: 2022-08-23 — End: ?
  Filled 2022-08-23: qty 4

## 2022-08-23 MED ORDER — AQUAPHOR EX OINT
TOPICAL_OINTMENT | Freq: Two times a day (BID) | CUTANEOUS | Status: DC
Start: 2022-08-23 — End: 2022-09-03
  Filled 2022-08-23 (×2): qty 50

## 2022-08-23 MED ORDER — HEPARIN SODIUM (PORCINE) 5000 UNIT/ML IJ SOLN
3000.0000 [IU] | INTRAMUSCULAR | Status: DC | PRN
Start: 2022-08-23 — End: 2022-08-26

## 2022-08-23 MED ORDER — DIPHENHYDRAMINE HCL 50 MG/ML IJ SOLN
INTRAMUSCULAR | Status: AC
Start: 2022-08-23 — End: ?
  Filled 2022-08-23: qty 1

## 2022-08-23 MED ORDER — LIDOCAINE-EPINEPHRINE 2 %-1:100000 IJ SOLN
INTRAMUSCULAR | Status: AC
Start: 2022-08-23 — End: ?
  Filled 2022-08-23: qty 20

## 2022-08-23 MED ORDER — SODIUM HYPOCHLORITE 0.125 % EX SOLN
Freq: Two times a day (BID) | CUTANEOUS | Status: DC
Start: 2022-08-23 — End: 2022-08-26
  Filled 2022-08-23: qty 473

## 2022-08-23 MED ORDER — OXYCODONE HCL 5 MG PO TABS
5.0000 mg | ORAL_TABLET | Freq: Four times a day (QID) | ORAL | Status: DC | PRN
Start: 2022-08-23 — End: 2022-09-03
  Administered 2022-08-24 – 2022-09-03 (×6): 5 mg via ORAL
  Filled 2022-08-23 (×7): qty 1

## 2022-08-23 MED ORDER — MEGESTROL ACETATE 400 MG/10ML PO SUSP
400.0000 mg | Freq: Two times a day (BID) | ORAL | Status: DC
Start: 2022-08-23 — End: 2022-08-31
  Administered 2022-08-23 – 2022-08-26 (×8): 400 mg via ORAL
  Filled 2022-08-23 (×12): qty 10

## 2022-08-23 MED ORDER — SODIUM CHLORIDE 0.9 % IV BOLUS
250.0000 mL | INTRAVENOUS | Status: AC | PRN
Start: 2022-08-23 — End: 2022-08-23

## 2022-08-23 MED ORDER — MORPHINE SULFATE 2 MG/ML IJ/IV SOLN (WRAP)
2.0000 mg | Freq: Three times a day (TID) | Status: DC | PRN
Start: 2022-08-23 — End: 2022-08-26
  Administered 2022-08-23 – 2022-08-26 (×5): 2 mg via INTRAVENOUS
  Filled 2022-08-23 (×5): qty 1

## 2022-08-23 MED ORDER — LIDOCAINE 1% BUFFERED - CNR/OUTSOURCED
INTRAMUSCULAR | Status: AC
Start: 2022-08-23 — End: ?
  Filled 2022-08-23: qty 11

## 2022-08-23 MED ORDER — FENTANYL CITRATE (PF) 50 MCG/ML IJ SOLN (WRAP)
INTRAMUSCULAR | Status: AC
Start: 2022-08-23 — End: ?
  Filled 2022-08-23: qty 4

## 2022-08-23 MED ORDER — SODIUM CHLORIDE 0.9 % IV BOLUS
100.0000 mL | INTRAVENOUS | Status: AC | PRN
Start: 2022-08-23 — End: 2022-08-23

## 2022-08-23 SURGICAL SUPPLY — 9 items
DRESSING HEMOSTATIC QUIKCLOT L4 IN X W4 (Dressing) ×1 IMPLANT
DRESSING HEMOSTATIC QUIKCLOT L4 IN X W4 IN KAOLIN 4 PLY NONWOVEN (Dressing) ×1 IMPLANT
GUIDEWIRE VASCULAR OD.035 IN L180 CM L3 (Guidewire) ×2 IMPLANT
GUIDEWIRE VASCULAR OD.035 IN L180 CM L3 CM GLIDEWIRE STANDARD ANGLE (Guidewire) ×2 IMPLANT
KIT CATHETER L36 CM L19 CM OD14.5 FR (Catheter) ×1 IMPLANT
KIT CATHETER L36 CM L19 CM OD14.5 FR MAHURKAR TAL VENATRAC CHRONIC (Catheter) ×1 IMPLANT
TRAY SRG ALL PURPOSE (Pack) ×1 IMPLANT
TRAY SRG ALL PURPOSE IFOH (Pack) ×1
TRAY SURGICAL ALL PURPOSE FOAKS (Pack) ×1 IMPLANT

## 2022-08-23 NOTE — Consults (Signed)
Nutritional Support Services  Nutrition Assessment    Shelby Barron 82 y.o. female   MRN: [id number]    Summary of Nutrition Recommendations:    Continue regular diet   Add oral nutrition supplement  ensure plus HP TID strawberry    Add juven BID for wound healing  Daily weights, standing scale preferable if able  Monitor and document po intake    -----------------------------------------------------------------------------------------------------------------                                                        Assessment Data:   Referral Source: consult  Reason for Referral: poor appetite    Nutrition: Pt seen at bedside. Unable to participate in assessment. Receiving dialysis. Per RN, was NPO for cath procedure this AM. Pt occasionally keeps mouth closed and refuses meds. Daughter gave specific instructions on feeding and it has to be the right person at the right time to offer her food. Drinks ensure strawberry. Will order TID. Per epic- 8.2 kg weight loss x 1 month - 9% clinically significant. NFPE performed showing moderate muscle and fat wasting in upper body. Equipment on legs UTA and UTA back region 2/2 positioning. Pt with wound on buttocks and abd - add juven BID. Pt on megace at home - messaged MD to place order. Overall, suspecting pt not meeting adequate energy requirements and meets malnutrition criteria.    Learning Needs: encouraged po intake     Hospital Admission: 82 y.o. female with a PMHx of [name], RCC S/P Bilateral Nephrectomy, AF on Eliquis, Urothelial CA S/P Cystectomy c/b Small Bowel Enterotomy S/P Small Bowel Anastomosis+Abdominal Closure+Wound Vac, Chronic Diastolic CHF, DM Type II, HTN, HLD, Hx. CVA (residual LUE flaccid paralysis), AOCD, and Gout who presented with Clogged HD Catheter, has not received HD since Tuesday.     Patient recently discharged to SNF after extensive complicated admission at [facility]. Her HD catheter has been clogged and non-functioning for the past 4  days, thus has not received dialysis since then. Nephrology evaluated her as outpatient, gave kayexalate and tried to manage as outpatient, however was unsuccessful so with worsening labs she was sent to the hospital for management. In the ED, VSS, labs WBC 16, Hgb 8.9, eGFR 5.7. IR and Nephrology consulted. Patient tracking with eyes, but nonverbal and not following commands on exam, daughter at bedside reports patient is not responding because she is upset about being back in the hospital, she has a caretaker named Jerrye Beavers who is the only person the patient will speak to and take medication from.    Medical Hx:  has a past medical history of Acid reflux, Asthma, well controlled, Cancer of kidney, Chronic lower back pain, Congestive heart disease, COPD (chronic obstructive pulmonary disease), CVA (cerebral infarction) (01/12/2003), Diabetes, Diabetic nephropathy, Fibroids, Gout, H/O: gout, Hyperlipidemia, Hypertension, Neuropathy of hand, and SOB (shortness of breath).    PSH: has a past surgical history that includes Hernia repair; APPENDECTOMY (OPEN); Hysterectomy; tumor removed from kidney Marletta Lor 2015); Partial nephrectomy; TURBT; Drain (Other) (N/A, 05/31/2022); Tunneled Cath Placement (Permcath) (N/A, 06/01/2022); CYSTECTOMY, ILEOCONDUIT (N/A, 06/28/2022); LAPAROTOMY, NEPHRECTOMY (Bilateral, 06/28/2022); EXPLORATORY LAPAROTOMY, RESECTION SMALL BOWEL (N/A, 06/28/2022); WOUND VAC APPLICATION (N/A, 06/28/2022); CLOSURE, ENTEROTOMY, SMALL INTESTINE (06/28/2022); EXPLORATORY LAPAROTOMY (N/A, 06/30/2022); WOUND VAC APPLICATION (N/A, 06/30/2022); Tunneled Cath Removal (Permcath) (Right, 07/14/2022); and  Tunneled Cath Placement (Permcath) (Right, 07/14/2022).     Orders Placed This Encounter   Procedures    Adult diet Therapeutic/ Modified; Solid; Pureed (IDDSI level 4); Mildly thick (IDDSI level 2)    Ensure Plus High Protein Supplement Quantity: A. One; Flavor: Strawberry; Frequency: TID (3 times a day) with meals    Juven  Quantity: A. One; Frequency: BID (2 times a day) - with Breakfast and Dinner     ANTHROPOMETRIC  Height: 165.1 cm (5\' 5" )  Weight: 82.2 kg (181 lb 3.5 oz)     Weight Change: 0     Body mass index is 30.16 kg/m.      Weight Monitoring     Weight Weight Method   07/20/2022 90.4 kg  Bed Scale    07/21/2022 90.4 kg  Bed Scale    07/22/2022 90.4 kg  Bed Scale    07/23/2022 90.4 kg  Bed Scale    07/24/2022 87.5 kg  Bed Scale    07/25/2022 86.9 kg  Bed Scale    07/26/2022 86.9 kg  Bed Scale    07/27/2022 95.2 kg  Bed Scale    07/28/2022 90.5 kg  Bed Scale    07/29/2022 97.387 kg  Bed Scale    07/30/2022 89.6 kg  Bed Scale    07/31/2022 87.317 kg     08/01/2022 85.6 kg     08/02/2022 89.3 kg     08/03/2022 90.1 kg  Bed Scale     90.102 kg     08/04/2022 89.4 kg  Bed Scale    08/05/2022 82.5 kg  Bed Scale    08/06/2022 83.099 kg  Bed Scale    08/07/2022 85.7 kg  Bed Scale    08/08/2022 86 kg  Bed Scale    08/09/2022 84.6 kg     08/10/2022 85.9 kg     08/11/2022 79.7 kg  Bed Scale    08/12/2022 83.4 kg  Bed Scale    08/13/2022 83.4 kg     08/14/2022 81.6 kg  Bed Scale    08/15/2022 82.7 kg  Bed Scale    08/16/2022 85.4 kg     08/17/2022 81.2 kg  Bed Scale    08/18/2022 81.2 kg     08/20/2022 85 kg  Estimated    08/22/2022 82.2 kg  Bed Scale     82.2 kg       Weight History Summary: 8.2 kg weight loss x 1 month - 9% clinically significant     ESTIMATED NEEDS    Total Daily Energy Needs: 1644 to 2055 kcal  Method for Calculating Energy Needs: 20 kcal - 25 kcal per kg  at 82.2 kg (Actual body weight)  Rationale: obese non icu wound HD       Total Daily Protein Needs: 85.05 to 113.4 g  Method for Calculating Protein Needs: 1.5 g - 2 g per kg at 56.7 kg (0 body weight)  Rationale: obese non icu wound HD      Total Daily Fluid Needs: 1644 to 2055 ml  Method for Calculating Fluid Needs: 1 ml per kcal energy = 1644 to 2055 kcal         Pertinent Medications:  Current Facility-Administered Medications   Medication Dose Route Frequency    amiodarone  200 mg Oral Daily     apixaban  2.5 mg Oral Q12H [facility]    atorvastatin  40 mg Oral Daily    b complex-vitamin c-folic acid  1 tablet Oral  Daily    carvedilol  12.5 mg Oral Q12H [facility]    cinacalcet  30 mg Oral QPM    megestrol  400 mg Oral BID Meals    midodrine  5 mg Oral BID Meals    montelukast  10 mg Oral QHS    oxybutynin XL  5 mg Oral Daily    pantoprazole  40 mg Oral Daily    petrolatum   Topical Q12H [facility]    sevelamer carbonate  800 mg Oral TID MEALS    sodium hypochlorite   Irrigation Q12H [facility]    umeclidinium-vilanterol  1 puff Inhalation QAM         IVF:  Infusion Meds[1]    Allergies[2]      Pertinent labs:  Recent Labs   Lab 08/23/22  0438 08/22/22  0333 08/21/22  1424 08/20/22  1640 08/18/22  0230 08/17/22  0441   Sodium 140 141 139 137 141 140   Potassium 5.1 4.5 4.8 5.2 4.1 4.9   Chloride 100 98* 100 100 102 101   CO2 22 23 23 23 24 23    BUN 67* 61* 52* 42* 18 39*   Creatinine 8.0* 6.9* 6.6* 5.3* 2.7* 4.8*   Glucose 73 60* 76 82 128* 88   Calcium 8.4 9.0 9.1 10.0 8.6 8.9   Magnesium 2.0 2.1 2.0  --  1.7 1.9   Phosphorus 5.1*  --  4.3  --  2.3 2.9   GFR 4.6* 5.4* 5.7* 7.5* 16.8* 8.4*   WBC 17.19* 16.64* 16.33* 15.25* 16.11*  --    Hematocrit 25.8* 25.8* 27.4* 30.5* 26.1*  --    Hemoglobin 8.3* 8.4* 8.9* 9.7* 8.4*  --        Physical Assessment: 8/5   Head: temple region: slight depression with decrease in muscle tone/resistance (moderate muscle loss - temporalis), orbital region: slightly dark circles, somewhat hollow look, some decrease in bounce back of fat pads (moderate fat loss), and buccal region: slight depression, somewhat sunken appearance, flat cheeks, decrease in bounce back of fat pads (moderate fat loss)  Upper Body: clavicle bone region: some protrusion of the clavicle with decrease in muscle tone/resistance (moderate muscle loss - pectoralis major), shoulder and acromion bone region: slight protrusion of acromion process, decrease in muscle tone/resistance (moderate muscle loss - deltoid), upper arm region: some  fat in pinch between fingers, but not ample (moderate fat loss), and dorsal hand region: muscle may bulge or be flat, no depression, feel muscle tone/resistance (no wasting observed)  Lower Body: deferred d/t equipment   Edema: non pitting LUE   Skin: blanch red bilat heels, bruising, cracking bilat feet, tear buttocks, abd wound   GI function: rounded, tender, hypoactive                                                                 Nutrition Diagnosis      Severe Malnutrition related to inadequate oral intake in the setting of acute illness as evidenced by 9% weight loss x 1 month,  moderate muscle wasting (temporalis, pectoralis, deltoid) and moderate fat loss (orbital, buccal, triceps) - new  Intervention     Nutrition recommendation - Please refer to top of note                                                                Monitoring/Evaluation     Goals:     Patient to meet >75% of estimated needs through meals and ONS on f/u.- new    Jilda Roche MS, RDN  Clinical Dietitian PRN           [1]   Current Facility-Administered Medications   Medication Dose Route Frequency Last Rate   [2]   Allergies  Allergen Reactions    Metrizamide Shortness Of Breath and Nausea Only    Rosuvastatin Other (See Comments)     myopathy    myopathy   myopathy    myopathy myopathy      myopathy    myopathy, myopathy    Iodinated Contrast Media Other (See Comments)     Nausea and SOB per patient   On Dialysisi    Nausea and SOB per patient    Nausea and SOB per patient      Nausea and SOB per patient   Nausea and SOB per patient    Nausea and SOB per patient      Nausea and SOB per patient Nausea and SOB per patient      Nausea and SOB per patient Nausea and SOB per patient Nausea and SOB per patient      Nausea and SOB per patient  Nausea and SOB per patient Nausea and SOB per patient    Nausea and SOB per patient   Nausea and SOB per patient    Pioglitazone      Weight  gain    Sulfa Antibiotics Hives     blister

## 2022-08-23 NOTE — Brief Op Note (Signed)
BRIEF OP NOTE    Date Time: 08/23/22 10:53 AM    Patient Name:   Shelby Barron, Shelby Barron (MRN: [id number])    Date of Operation:   08/23/2022     Providers Performing:   Surgeons and Role:     * , Loren Racer, MD - Primary    Surgical First Assistant(s):   None    Operative Procedure:   TUNNELED CATH CHECK/CHANGE-permacath exchange:     Preoperative Diagnosis:   Pre-Op Diagnosis Codes:      * CKD (chronic kidney disease) requiring chronic dialysis [N18.6, Z99.2]    Postoperative Diagnosis:   Post-Op Diagnosis Codes:     * CKD (chronic kidney disease) requiring chronic dialysis [N18.6, Z99.2]    Findings:   Permcath exchange 19 cm 14.33F, tip in RA.    Anesthesia:   No value filed.    Estimated Blood Loss:    * No values recorded between 08/23/2022 10:12 AM and 08/23/2022 10:53 AM *    Implants:     Implant Name Type Inv. Item Serial No. Model No. Manufacturer Lot No. LRB No. Used Action   KIT CATHETER L36 CM L19 CM OD14.5 FR - [id number] Catheter KIT CATHETER L36 CM L19 CM OD14.5 FR  [id number] [employer] [id number] Right 1 Implanted           Drains:   Drains: no            Specimens:   * No specimens in log *       Complications:   None     Signed by: Tamala Bari, MD                                                                           FO IVR

## 2022-08-23 NOTE — Progress Notes (Signed)
Arrived at the patient's room to initiate hemodialysis. Patient is alert. Waited for patient to return from CVC placement. TIMEOUT STARTED, Periodic confusion. Right Subclavian CVC newly placed sluggish. TEGO's changed and lines reversed. TX soon to be initiated. Will keep monitoring Patient.               08/23/22 1140   Bedside Nurse Communication   Name of bedside RN - pre dialysis Vallery Ridge RN   Treatment Initiation- With Dialysis Precautions   Time Out/Safety Check Completed Yes   Consent for HD signed for this hospitalization (Date) 06/01/22   Consent for HD signed for this hospitalization (Time) 1514   Preferred language Yes   Complication waiver Y   Blood Consent Verified Yes   Dialysis Precautions All Connections Secured   Dialysis Treatment Type Routine   Is patient diabetic? Yes   RO/Hemodialysis Cabin crew   Is Total Chlorine less than 0.1 ppm? Yes   Orignial Total Chlorine Testing Time 1210   At 4 Hour Total Chlorine Testing Time 1610   RO/Hemodialysis Arts development officer Number 2    Machine Serial Number [id number]   RO # 32   Water Hardness Yes   pH 7.4   Pressure Test Verified Yes   Alarms Verified Passed   Machine Temperature 96.8 F (36 C)   Manual Machine Temperature 96.8 F (36 C)   Alarms Verified Yes   Na+ mEq (Machine) 138 mEq   Bicarb mEq (Machine) 35 mEq   Hemodialysis Conductivity (Machine) 13.6   Hemodialysis Conductivity (Meter) 13.6   RO Machine Log Completed Yes   Hepatitis Status   HBsAg (Antigen) Result Negative   Dialysis Weight   Pre-Treatment Weight (Kg) 82.2   Scale Type N/A   Vitals   Temp 97.6 F (36.4 C)   Heart Rate 71   Resp Rate 14   BP 149/70   SpO2 97 %   O2 Device None (Room air)   Assessment   Mental Status Alert;Oriented;Cooperative   Cardiac (WDL) WDL   Cardiac Regularity Return to [facility]   Cardiac Rhythm Normal sinus rhythm   Respiratory  WDL   R Breath Sounds Return to WDL   L Breath Sounds Return to [facility]   Cough Non-productive   Edema  WDL    General Skin Color Appropriate for ethnicity   Skin Temp Warm   Gastrointestinal (WDL) WDL   Abdomen Inspection Return to [facility]   Mobility Bed   Permacath Catheter - Tunneled 08/23/22 Subclavian vein catheter Right   Placement Date/Time: (c) 08/23/22 1047   Inserted by: Dr. Bonita Quin  Access Type: Subclavian vein catheter  Orientation: Right  Central Line Infection Prevention Education provided?: Yes  Hand Hygiene: Chlorhexidine;Alcohol based hand scrub;Soap and water  ...   Line necessity reviewed? Apheresis/hemodialysis   Site Assessment Clean;Dry;Intact   Catheter Lumen Volume Venous 1700 mL   Catheter Lumen Volume Arterial 1600 mL   Dressing Status and Intervention Dressing Intact   Tego/Curos Caps on Catheter Yes   NEW Tego/Curos Caps placed (Date) 08/23/22   End Caps Free From Blood Yes   Line Care Connections checked and tightened   Dressing Type Transparent;Biopatch   Dressing Status Clean;Dry;Intact   Dressing/Line Intervention Caps changed   Line Used For Blood Draw Yes   Dressing Change Due 08/30/22   Numeric Pain Scale   Pain Score 0   POSS Score 1   Pain Location Abdomen   Pain Frequency  Continuous   Multiple Pain Sites No   Hemodialysis Comments   Pre-Hemodialysis Comments TIME OUT STARTED   Hemodialysis History Information   Chronic Dialysis Patient Yes   Outpatient Nephrologist DR. [name]

## 2022-08-23 NOTE — H&P (Signed)
BRIEF IR HISTORY AND PHYSICAL    Date Time: 08/23/22 10:19 AM    INDICATIONS:   Procedure(s):  TUNNELED CATH CHECK/CHANGE-permacath exchange      PROCEDURALIST COMMENTS BELOW:   53F with malfxning permcath, here for exchange.    PAST MEDICAL HISTORY:   Medical History[1]    PAST SURGICAL HISTORY     Past Surgical History:   Procedure Laterality Date    APPENDECTOMY (OPEN)      CLOSURE, ENTEROTOMY, SMALL INTESTINE  06/28/2022    Procedure: CLOSURE, ENTEROTOMY, SMALL INTESTINE;  Surgeon: Marlaine Hind, MD;  [facility];  Service: General;;    CYSTECTOMY, ILEOCONDUIT N/A 06/28/2022    Procedure: CYSTECTOMY, RADICAL;  Surgeon: Neldon Newport, MD;  [facility];  Service: Urology;  Laterality: N/A;    DRAIN (OTHER) N/A 05/31/2022    Procedure: DRAIN (OTHER);  Surgeon: Hope Pigeon, MD;  [facility];  Service: Interventional Radiology;  Laterality: N/A;    EXPLORATORY LAPAROTOMY N/A 06/30/2022    Procedure: SMALL BOWEL ANASTOMOSIS, CLOSURE OF MESENTERIC DEFECT, CLOSURE OF ABDOMINAL WALL;  Surgeon: Marlaine Hind, MD;  [facility];  Service: General;  Laterality: N/A;  **[id number]/ MD AVAIL [id number] OR AFTER 1600**    EXPLORATORY LAPAROTOMY, RESECTION SMALL BOWEL N/A 06/28/2022    Procedure: RESECTION SMALL BOWEL;  Surgeon: Marlaine Hind, MD;  [facility];  Service: General;  Laterality: N/A;    HERNIA REPAIR      HYSTERECTOMY      LAPAROTOMY, NEPHRECTOMY Bilateral 06/28/2022    Procedure: BILATERAL OPEN RADICAL NEPHRECTOMY AND LYSIS OF ASHESION;  Surgeon: Neldon Newport, MD;  [facility];  Service: Urology;  Laterality: Bilateral;    PARTIAL NEPHRECTOMY      tumor removed from kidney  cJanuary 2015    TUNNELED CATH PLACEMENT (PERMCATH) N/A 06/01/2022    Procedure: TUNNELED CATH PLACEMENT;  Surgeon: Suszanne Finch, MD;  [facility];  Service: Interventional Radiology;  Laterality: N/A;    TUNNELED CATH PLACEMENT (PERMCATH) Right 07/14/2022     Procedure: TUNNELED CATH PLACEMENT;  Surgeon: Hope Pigeon, MD;  [facility];  Service: Interventional Radiology;  Laterality: Right;  right IJ tunneled HD catheter placement    TUNNELED CATH REMOVAL (PERMCATH) Right 07/14/2022    Procedure: TUNNELED CATH REMOVAL;  Surgeon: Hope Pigeon, MD;  [facility];  Service: Interventional Radiology;  Laterality: Right;  right neck/chest tunneled HD catheter removal    TURBT      WOUND VAC APPLICATION N/A 06/28/2022    Procedure: Tawni Pummel WOUND VAC APPLICATION;  Surgeon: Marlaine Hind, MD;  [facility];  Service: General;  Laterality: N/A;    WOUND VAC APPLICATION N/A 06/30/2022    Procedure: WOUND VAC APPLICATION;  Surgeon: Marlaine Hind, MD;  [facility];  Service: General;  Laterality: N/A;       Family History:     Family History   Problem Relation Age of Onset    Cancer Mother     Heart failure Father     Cancer Maternal Aunt     Diabetes Maternal Aunt     Cancer Paternal Aunt        Social History:     Social History     Socioeconomic History    Marital status: Married     Spouse name: Not on file    Number of children: Not on file    Years of education:  Not on file    Highest education level: Not on file   Occupational History    Not on file   Tobacco Use    Smoking status: Former    Smokeless tobacco: Never   Vaping Use    Vaping status: Never Used   Substance and Sexual Activity    Alcohol use: No    Drug use: No    Sexual activity: Not Currently     Partners: Male   Other Topics Concern    Not on file   Social History Narrative    Not on file     Social Determinants of Health     Financial Resource Strain: Patient Unable To Answer (08/17/2022)    Overall Financial Resource Strain (CARDIA)     Difficulty of Paying Living Expenses: Patient unable to answer   Food Insecurity: No Food Insecurity (05/26/2022)    Hunger Vital Sign     Worried About Running Out of Food in the Last Year: Never true     Ran Out of Food in the  Last Year: Never true   Transportation Needs: Patient Unable To Answer (08/17/2022)    PRAPARE - Transportation     Lack of Transportation (Medical): Patient unable to answer     Lack of Transportation (Non-Medical): Patient unable to answer   Physical Activity: Patient Unable To Answer (08/17/2022)    Exercise Vital Sign     Days of Exercise per Week: Patient unable to answer     Minutes of Exercise per Session: Patient unable to answer   Stress: Patient Unable To Answer (08/17/2022)    [employer] of Occupational Health - Occupational Stress Questionnaire     Feeling of Stress : Patient unable to answer   Social Connections: Patient Unable To Answer (08/17/2022)    Social Connection and Isolation Panel [NHANES]     Frequency of Communication with Friends and Family: Patient unable to answer     Frequency of Social Gatherings with Friends and Family: Patient unable to answer     Attends Religious Services: Patient unable to answer     Active Member of Clubs or Organizations: Patient unable to answer     Attends Banker Meetings: Patient unable to answer     Marital Status: Patient unable to answer   Intimate Partner Violence: Not At Risk (05/26/2022)    Humiliation, Afraid, Rape, and Kick questionnaire     Fear of Current or Ex-Partner: No     Emotionally Abused: No     Physically Abused: No     Sexually Abused: No   Housing Stability: Patient Unable To Answer (08/17/2022)    Housing Stability Vital Sign     Unable to Pay for Housing in the Last Year: Patient unable to answer     Number of Times Moved in the Last Year: Not on file     Homeless in the Last Year: Patient unable to answer         REVIEW OF SYSTEMS REVIEWED:   YES  ( x )        HOME MEDICATIONS     Prior to Admission medications    Medication Sig Start Date End Date Taking? Authorizing Provider   acetaminophen (TYLENOL) 500 MG tablet Take 2 tablets (1,000 mg) by mouth 3 (three) times daily   Yes [provider]   amiodarone  (PACERONE) 200 MG tablet Take 1 tablet (200 mg) by mouth daily This is a maintenance dose.  08/05/22  Yes Lennox Pippins, MD   apixaban (ELIQUIS) 2.5 MG Take 1 tablet (2.5 mg) by mouth every 12 (twelve) hours 08/05/22  Yes Dar, Keenan Bachelor, MD   atorvastatin (LIPITOR) 40 MG tablet Take 1 tablet (40 mg) by mouth daily   Yes [provider]   b complex-vitamin c-folic acid (NEPHRO-VITE) 0.8 MG Tab Take 1 tablet by mouth daily 08/08/22  Yes Dar, Keenan Bachelor, MD   carvedilol (COREG) 12.5 MG tablet Take 1 tablet (12.5 mg) by mouth every 12 (twelve) hours 08/05/22  Yes Lennox Pippins, MD   cinacalcet (SENSIPAR) 30 MG tablet Take 1 tablet (30 mg) by mouth every evening   Yes [provider]   conjugated estrogens (PREMARIN) vaginal cream Place vaginally every other day At bedtime   Yes [provider]   megestrol (MEGACE) 40 MG tablet Take 1 tablet (40 mg) by mouth 4 (four) times daily 08/13/22  Yes Donzetta Sprung, MD   megestrol acetate (MEGACE) 400 MG/10ML Suspension Take 10 mLs (400 mg) by mouth daily 08/17/22  Yes Donzetta Sprung, MD   midodrine (PROAMATINE) 10 MG tablet Take 1 tablet (10 mg) by mouth Once daily every Tuesday, Thursday and Saturday morning  Patient taking differently: Take 1 tablet (10 mg) by mouth Once daily every Tuesday, Thursday and Saturday morning Hold for SBP > 140 08/14/22  Yes Donzetta Sprung, MD   midodrine (PROAMATINE) 5 MG tablet Take 1 tablet (5 mg) by mouth 2 (two) times daily with meals 08/16/22  Yes Donzetta Sprung, MD   montelukast (SINGULAIR) 10 MG tablet Take 1 tablet (10 mg) by mouth nightly   Yes [provider]   ondansetron (Zofran) 4 MG tablet Take 1 tablet (4 mg) by mouth every 8 (eight) hours as needed for Nausea 06/04/22  Yes Yousuf, Munib A, MD   oxybutynin XL (DITROPAN-XL) 5 MG 24 hr tablet Take 1 tablet (5 mg) by mouth daily   Yes [provider]   pantoprazole (PROTONIX) 20 MG tablet Take 1 tablet (20 mg) by mouth every 12 (twelve) hours   Yes [provider]   polyethylene glycol (MIRALAX) 17 g packet Take 17 g by mouth daily as needed (constipation) 08/10/22  Yes Yousuf, Munib A, MD   polyethylene glycol (MIRALAX) 17 g packet Take 17 g by mouth daily 08/13/22  Yes Donzetta Sprung, MD   sevelamer carbonate (RENVELA) 800 MG tablet Take 1 tablet (800 mg) by mouth 3 (three) times daily with meals   Yes [provider]   traMADol (ULTRAM) 50 MG tablet Take 1 tablet (50 mg) by mouth every 8 (eight) hours as needed for Pain   Yes [provider]   umeclidinium-vilanterol (Anoro Ellipta) 62.5-25 MCG/ACT Aerosol Pwdr, Breath Activated Inhale 1 puff into the lungs daily   Yes [provider]         INPATIENT MEDICATIONS      Current Facility-Administered Medications   Medication Dose Route Frequency    amiodarone  200 mg Oral Daily    [Held by provider] apixaban  2.5 mg Oral Q12H SCH    atorvastatin  40 mg Oral Daily    b complex-vitamin c-folic acid  1 tablet Oral Daily    carvedilol  12.5 mg Oral Q12H [facility]    cinacalcet  30 mg Oral QPM    megestrol  400 mg Oral BID Meals    midodrine  5 mg Oral BID Meals    montelukast  10 mg Oral QHS  oxybutynin XL  5 mg Oral Daily    pantoprazole  40 mg Oral Daily    sevelamer carbonate  800 mg Oral TID MEALS    umeclidinium-vilanterol  1 puff Inhalation QAM       acetaminophen, albumin human, benzocaine-menthol, benzonatate, carboxymethylcellulose sodium, dextrose **OR** dextrose **OR** dextrose **OR** glucagon (rDNA), melatonin, naloxone, oxyCODONE, polyethylene glycol, saline, sodium chloride, sodium chloride      ALLERGIES:   Allergies[2]      PREVIOUS REACTION TO SEDATION MEDICATIONS     NO ( x )   YES (  )      PHYSICAL EXAM     AIRWAY CLASSIFICATION:    CLASS I   (  )   CLASS II  ( x )    CLASS III  (  )     CLASS IV  (  )    INTUBATED (  )    CARDIAC :   RRR      LUNGS:   NLR      LABS:     Lab Results   Component Value Date/Time    WBC 17.19 (H) 08/23/2022 04:38 AM    WBC 11.42 (H)  06/03/2022 04:23 AM    HCT 25.8 (L) 08/23/2022 04:38 AM    HCT 26.8 (L) 06/03/2022 04:23 AM    PLT 433 (H) 08/23/2022 04:38 AM    PLT 196 06/03/2022 04:23 AM    INR 1.1 07/14/2022 04:35 AM    INR 1.1 06/01/2022 03:15 AM    PT 12.4 07/14/2022 04:35 AM    PT 12.7 06/01/2022 03:15 AM    PTT 83 (H) 08/02/2022 05:27 AM    PTT 27 06/01/2022 03:15 AM    BUN 67 (H) 08/23/2022 04:38 AM    BUN 25.0 (H) 06/03/2022 04:23 AM    CREAT 8.0 (H) 08/23/2022 04:38 AM    CREAT 9.4 (H) 06/03/2022 04:23 AM    GLU 73 08/23/2022 04:38 AM    GLU 147 (H) 06/03/2022 04:23 AM    K 5.1 08/23/2022 04:38 AM    K 3.6 06/03/2022 04:23 AM           ASA PHYSICAL STATUS   (  )  ASA 1   HEALTHY PATIENT  (  )  ASA 2   MILD SYSTEMIC ILLNESS  ( x )  ASA 3   SYSTEMIC DISEASE, NOT INCAPACITATING  (  )  ASA 4   SEVERE SYSTEMIC DISEASE, DISEASE IS CONSTANT THREAT TO LIFE  (  )  ASA 5   MORIBUND CONDITION, NOT EXPECTED TO LIVE >24 HOURS            IRRESPECTIVE OF PROCEDURE  (  )  E           EMERGENCY PROCEDURE       PLANNED SEDATION:   (  ) NO SEDATION  ( x ) MODERATE SEDATION  (  ) DEEP SEDATION WITH ANESTHESIA      CONCLUSION:   PATIENT HAS BEEN REASSESSED IMMEDIATELY PRIOR TO THE PROCEDURE   AND IS AN APPROPRIATE CANDIDATE FOR THE PLANNED SEDATION AND   PROCEDURE.  RISKS, BENEFITS AND ALTERNATIVES TO THE PLANNED   PROCEDURE AND SEDATION HAVE BEEN EXPLAINED TO THE PATIENT   OR GUARDIAN.    ( x )  YES  (  )  EMERGENCY CONSENT       Signed by: Tamala Bari, MD  [facility]-Section of Vascular & Interventional Radiology  Contact Numbers:  Regular business hours (8A-5P M-F):  [facility]:  [phone] (option 3-outpatient scheduling, option 4-consults, option 5-inpatient procedures)  [facility]:  [phone]  [facility]: [phone]  After hours/Answering service:  [phone]         [1]   Past Medical History:  Diagnosis Date    Acid reflux     Asthma, well controlled     Cancer of kidney     Chronic  lower back pain     Congestive heart disease     COPD (chronic obstructive pulmonary disease)     CVA (cerebral infarction) 01/12/2003    Diabetes     Diabetic nephropathy     Fibroids     Gout     H/O: gout     Hyperlipidemia     Hypertension     Neuropathy of hand     SOB (shortness of breath)    [2]   Allergies  Allergen Reactions    Metrizamide Shortness Of Breath and Nausea Only    Rosuvastatin Other (See Comments)     myopathy    myopathy   myopathy    myopathy myopathy      myopathy    myopathy, myopathy    Iodinated Contrast Media Other (See Comments)     Nausea and SOB per patient   On Dialysisi    Nausea and SOB per patient    Nausea and SOB per patient      Nausea and SOB per patient   Nausea and SOB per patient    Nausea and SOB per patient      Nausea and SOB per patient Nausea and SOB per patient      Nausea and SOB per patient Nausea and SOB per patient Nausea and SOB per patient      Nausea and SOB per patient  Nausea and SOB per patient Nausea and SOB per patient    Nausea and SOB per patient   Nausea and SOB per patient    Pioglitazone      Weight gain    Sulfa Antibiotics Hives     blister

## 2022-08-23 NOTE — Progress Notes (Signed)
Pt to IR room 1 for Permacath exchange.  Telephone consent done with Dr. Bonita Quin and pt daughter, Shelby Barron.      Pt positioned on table, attached to monitor and O2.  Pt was sterile prepped and draped per protocol by IR tech, Keno.  A 14.5 Fr x 19 cm Permacath was exchanged under fluoroscopy and sutured with local anesthetic only.  Sedation not given per MD. Line placement confirmed with imaging.  No ectopy noted throughout case.     Oozing from site noted.  Pressure held.  Hemostasis achieved.  Quickclot and pressure dressing applied to site.      Report called to bedside RN.  Pt transported back to inpatient bed in stable condition.

## 2022-08-23 NOTE — Progress Notes (Signed)
Patient tolerated HD well but unable to meet goal due to continuous dropping in BP readings during TX. Primary RN aware and Nephrologist rounded and seen patient. Net fluid removal of 1100 cc. CVC site is secured with TEGO connectors and CUROS CAPS. D/c no other complaints. VSS               08/23/22 1600   Treatment Summary   Time Off Machine 1552   Duration of Treatment (Hours) 3.30   Treatment Type 1:1   Dialyzer Clearance Moderately streaked   Fluid Volume Off (mL) 1800   Prime Volume (mL) 200   Rinseback Volume (mL) 200   Fluid Given: Normal Saline (mL) 300   Fluid Given: PRBC  0 mL   Fluid Given: Albumin (mL) 0   Fluid Given: Other (mL) 0   Total Fluid Given 700   Hemodialysis Net Fluid Removed 1100   Post Treatment Assessment   Patient Response to Treatment Tolerated HD well   Information for Next Treatment Per MD   Additional Dialyzer Used None   Permacath Catheter - Tunneled 08/23/22 Subclavian vein catheter Right   Placement Date/Time: (c) 08/23/22 1047   Inserted by: Dr. Bonita Quin  Access Type: Subclavian vein catheter  Orientation: Right  Central Line Infection Prevention Education provided?: Yes  Hand Hygiene: Chlorhexidine;Alcohol based hand scrub;Soap and water  ...   Line necessity reviewed? Apheresis/hemodialysis   Site Assessment Clean;Dry;Intact   Dressing Status and Intervention Dressing Intact   Tego/Curos Caps on Catheter Yes   End Caps Free From Blood Yes   Line Care Connections checked and tightened   Dressing Type Transparent;Biopatch   Dressing Status Clean;Dry;Intact   Dressing/Line Intervention Caps changed;Dressing changed   Line Used For Blood Draw No   Dressing Change Due 08/30/22   Vitals   Temp 97.6 F (36.4 C)   Heart Rate 87   Resp Rate 21   BP 94/48   Assessment   Mental Status Alert;Oriented;Cooperative   Cardiac (WDL) WDL   Cardiac Regularity Return to [facility]   Cardiac Rhythm Normal sinus rhythm   Respiratory  WDL   R Breath Sounds Return to WDL   L Breath Sounds Return to [facility]    General Skin Color Appropriate for ethnicity   Skin Temp Warm   Gastrointestinal (WDL) WDL   Abdomen Inspection Return to [facility]   Numeric Pain Scale   Pain Score 6   POSS Score 1   Education   Person taught Patient   Knowledge basis Minimal   Topics taught Access care;S&S of infection   Teaching Tools Explain;Demo   Reponse Needs Reinforcement   Bedside Nurse Communication   Name of bedside RN - post dialysis Domitila RN

## 2022-08-23 NOTE — Progress Notes (Signed)
[facility] PROGRESS NOTE  [id number]      Date Time: 08/23/22 12:09 PM  Patient Name: Shelby Barron  Attending Physician: Regina Eck, MD    CC: follow-up ESRD  HD Note  Assessment:     1) Malfunctioning permcath - s/p replacement 08/23/22  2) [name] MWF  3) Anemia due to CKD  4) Deconditioning s/p prolonged hospitalization at [place]     Recommendations:     1) Tolerating HD: Current Rx =  Dialysis Treatment Data:  Blood flow rate:    Dialysate flow rate:    Ultrafiltration rate:      Dialysate potassium:    Dialysate calcium:      Arterial pressure:    Venous pressure:  mmHg   2) HD MWF per routine  3) Stable for [place] after HD today      Case discussed with: patient, HD RN      Glenard Haring, MD  [facility]  [id number] (office)      Subjective:   S/p permcath exchange.  Feels Ok.        Physical Exam:     Vitals:    08/23/22 1040 08/23/22 1045 08/23/22 1050 08/23/22 1124   BP: 159/80 148/70 181/81 156/70   Pulse: 77 75 79    Resp: 15 20 (!) 24    Temp:    98 F (36.7 C)   TempSrc:    Axillary   SpO2: 100% 100% 100% 98%   Weight:       Height:           Intake and Output Summary (Last 24 hours) at Date Time  No intake or output data in the 24 hours ending 08/23/22 1209    General: awake, alert, no distress  Cardiovascular: regular rate and rhythm, no murmurs, rubs or gallops  Lungs: clear to auscultation anteriorly  Abdomen: soft, non-tender, non-distended, normoactive bowel sounds  Extremities: no LE edema    Meds:      Scheduled Meds: PRN Meds:    amiodarone, 200 mg, Oral, Daily  apixaban, 2.5 mg, Oral, Q12H SCH  atorvastatin, 40 mg, Oral, Daily  b complex-vitamin c-folic acid, 1 tablet, Oral, Daily  carvedilol, 12.5 mg, Oral, Q12H SCH  cinacalcet, 30 mg, Oral, QPM  megestrol, 400 mg, Oral, BID Meals  midodrine, 5 mg, Oral, BID Meals  montelukast, 10 mg, Oral, QHS  oxybutynin XL, 5 mg, Oral, Daily  pantoprazole, 40 mg, Oral, Daily  sevelamer carbonate, 800 mg, Oral,  TID MEALS  umeclidinium-vilanterol, 1 puff, Inhalation, QAM           acetaminophen, 650 mg, Q4H PRN  albumin human, 100 mL, PRN  benzocaine-menthol, 1 lozenge, Q2H PRN  benzonatate, 100 mg, TID PRN  carboxymethylcellulose sodium, 1 drop, TID PRN  dextrose, 15 g of glucose, PRN   Or  dextrose, 12.5 g, PRN   Or  dextrose, 12.5 g, PRN   Or  glucagon (rDNA), 1 mg, PRN  melatonin, 3 mg, QHS PRN  naloxone, 0.2 mg, PRN  oxyCODONE, 2.5 mg, Q6H PRN  polyethylene glycol, 17 g, Daily PRN  saline, 2 spray, Q4H PRN  sodium chloride, 100 mL, Q1H PRN  sodium chloride, 250 mL, PRN              Labs:     Recent Labs   Lab 08/23/22  0438 08/22/22  0333 08/21/22  1424   WBC 17.19* 16.64* 16.33*   Hemoglobin 8.3* 8.4*  8.9*   Hematocrit 25.8* 25.8* 27.4*   Platelet Count 433* 457* 442*     Recent Labs   Lab 08/23/22  0438 08/22/22  0333 08/21/22  1424 08/20/22  1640 08/18/22  0230   Sodium 140 141 139  More results in Results Review 141   Potassium 5.1 4.5 4.8  More results in Results Review 4.1   Chloride 100 98* 100  More results in Results Review 102   CO2 22 23 23   More results in Results Review 24   BUN 67* 61* 52*  More results in Results Review 18   Creatinine 8.0* 6.9* 6.6*  More results in Results Review 2.7*   Calcium 8.4 9.0 9.1  More results in Results Review 8.6   Albumin 1.5*  --  1.6*  --  1.9*   Phosphorus 5.1*  --  4.3  --  2.3   Magnesium 2.0 2.1 2.0  --  1.7   Glucose 73 60* 76  More results in Results Review 128*   GFR 4.6* 5.4* 5.7*  More results in Results Review 16.8*   More results in Results Review = values in this interval not displayed.             Invalid input(s): "LEUKOCYTESUR"        Imaging personally reviewed, including: No results found.        Signed by: Glenard Haring, MD

## 2022-08-23 NOTE — OT Progress Note (Signed)
Attempted to see pt for OT session. Pt on HD.     Thank you  Gerre Couch, OTD, OTR/L

## 2022-08-23 NOTE — Progress Notes (Signed)
[facility]  HOSPITALIST  PROGRESS NOTE      Patient: Shelby Barron  Date: 08/23/2022   LOS: 1 Days  Admission Date: 08/21/2022   MRN: [id number]  Attending:  Regina Eck, MD     ASSESSMENT/PLAN     Shelby Barron is a 82 y.o. female with PMH AF on Eliquis, chronic HFpEF, HTN, HLD, h/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 during complicated admission at [facility], [name], RCC s/p bilateral nephrectomy, urothelial CA s/p cystectomy c/b small bowel enterotomy s/p small bowel anastomosis and abdominal closure requiring wound vac, anemia of chronic disease, COPD, h/o T2DM (last A1c 5.6 in June 2024), dysphagia, gout, admitted with malfunctioning HD catheter with need for HD and non-healing abdominal surgical wound.    Malfunctioning HD Catheter  [name] MWF  H/o RCC s/p bilateral nephrectomy  Urothelial CA s/p cystectomy  - Appreciate IR and [facility] consult and recommendations  - s/p Permacath exchange in IR 08/23/22  - s/p HD on 08/23/22, continue MWF HD schedule  - Continue Nephrovite, cinacalcet, sevelamer  - Continue midodrine for hypotension (hold for SBP>120)    SIRS present on admission  - SIRS present on admission with persistent leukocytosis, tachycardia  - CXR without infectious etiology, patient anuric w/out obvious signs of infection  - Monitor for fever    Non-healing abdominal surgical wound   Small bowel enterotomy s/p small bowel anastomosis, abdominal closure, wound vac  - Appreciate Surgery and WOCN consult and recommendations after malfunctioning wound vac removed  - Consideration for I&D of wound  - Continue heparin gtt and allow for Eliquis washout for 48 hours prior to any procedures, last dose Eliquis evening 08/22/22   - Pain management: morphine 2mg  IV q8h prn, oxycodone 5mg  po q6h prn     Anemia of chronic kidney disease, stable  Iron deficiency anemia  - Hb stable at b/l  - Iron studies from 07/18/22 reviewed, not on supplementation    COPD  - Continue montelukast,  Anoro    Dysphagia  - Appreciate SLP consult and recommendations  - Continue PPI  - Aspiration precautions    AF on Eliquis  Chronic HFpEF  HTN  HLD  H/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  - Continue amiodarone 200mg  po daily  - Continue atorvastatin 40mg  qHS  - Hold Eliquis 2.5 mg po BID, transition to heparin gtt in case of any procedures    Deconditioned state s/p prolonged hospitalisation  - PT/OT recommend SNF  - Fall precautions, skin precautions    Severe Protein Calorie Malnutrition related to inadequate oral intake in the setting of acute illness as evidenced by 9% weight loss x 1 month,  moderate muscle wasting (temporalis, pectoralis, deltoid) and moderate fat loss (orbital, buccal, triceps)   - Appreciate Nutrition consult and recommendations  - Continue Megace BID    Obesity Class I, BMI 30  - Lifestyle modifications impractical for largely bed-bound patient    Analgesia: Tylenol prn     FEN  Fluids: SL  Nutrition: Minced and Moist, thin liquids    Monitoring:  Telemetry needed: Yes  Continuous pulse oximetry needed: No  Level of Care: 3      Safety Checklist  DVT prophylaxis: Chemical and / or mechanical ppx NOT indicated or contraindicated: Fully anticoagulated    Foley: Not present   IVs:  Peripheral IV   PT/OT: Not needed   Daily CBC & or Chem ordered: Yes, due to  clinical and lab instability       Patient Lines/Drains/Airways Status       Active PICC Line / CVC Line / PIV Line / Drain / Airway / Intraosseous Line / Epidural Line / ART Line / Line / Wound / Pressure Ulcer / NG/OG Tube       Name Placement date Placement time Site Days    Permacath Catheter - Tunneled 07/14/22 Subclavian vein catheter Right 07/14/22  1404  -- 38    Peripheral IV 20 G Standard Anterior;Right Wrist --  --  Wrist  --    Wound 06/30/22 Surgical Incision Abdomen Other (Comment) WOUND VAC applied 06/30/22  1152  Abdomen  52    Wound 07/23/22 Skin Tear Buttocks;Coccyx Inner R and L cheek, coccyx 07/23/22  --   Buttocks;Coccyx  30                       Code Status: full code    DISPO: assisted living facility    Family Contact: As listed in medical chart    Care Plan discussed with nursing, consultants       SUBJECTIVE     Shelby Barron is largely non-verbal though tries to speak at times. Appears uncomfortable in pain from surgical incision site.  MEDICATIONS     Current Facility-Administered Medications   Medication Dose Route Frequency    amiodarone  200 mg Oral Daily    atorvastatin  40 mg Oral Daily    b complex-vitamin c-folic acid  1 tablet Oral Daily    carvedilol  12.5 mg Oral Q12H [facility]    cinacalcet  30 mg Oral QPM    megestrol  400 mg Oral BID Meals    midodrine  5 mg Oral BID Meals    montelukast  10 mg Oral QHS    oxybutynin XL  5 mg Oral Daily    pantoprazole  40 mg Oral Daily    petrolatum   Topical Q12H SCH    sevelamer carbonate  800 mg Oral TID MEALS    sodium hypochlorite   Irrigation Q12H SCH    umeclidinium-vilanterol  1 puff Inhalation QAM       ROS     All other systems not mentioned in subjective reviewed and negative - unable to obtain    PHYSICAL EXAM     Vitals:    08/23/22 1610   BP: 90/53   Pulse: 93   Resp: 19   Temp:    SpO2:        Temperature: Temp  Min: 97.6 F (36.4 C)  Max: 99 F (37.2 C)  Pulse: Pulse  Min: 63  Max: 101  Respiratory: Resp  Min: 14  Max: 32  Non-Invasive BP: BP  Min: 81/60  Max: 181/81  Pulse Oximetry SpO2  Min: 95 %  Max: 100 %    Intake and Output Summary (Last 24 hours) at Date Time    Intake/Output Summary (Last 24 hours) at 08/23/2022 1812  Last data filed at 08/23/2022 1600  Gross per 24 hour   Intake --   Output 1100 ml   Net -1100 ml     GEN APPEARANCE: Appears in pain at times.  Chronically ill, frail appearing  HEENT: Pupils round, equal, reactive. Anicteric sclera. Dry MM  NECK: supple  CVS: RRR, S1, S2. 2/6 systolic murmur  LUNGS: CTAB  ABD: +BS, soft NTND  EXT: Flaccid LUE paralysis with left hand mild contracture  SKIN: Pale. Large non-healing abdominal  surgical wound/incision with e/o necrotic tissue with thin tan drainage and malodor   NEURO: Flat affect. Largely non-verbal though attempts to speak.    LABS     Recent Labs   Lab 08/23/22  0438 08/22/22  0333 08/21/22  1424   WBC 17.19* 16.64* 16.33*   RBC 2.80* 2.84* 3.03*   Hemoglobin 8.3* 8.4* 8.9*   Hematocrit 25.8* 25.8* 27.4*   MCV 92.1 90.8 90.4   Platelet Count 433* 457* 442*       Recent Labs   Lab 08/23/22  0438 08/22/22  0333 08/21/22  1424 08/20/22  1640 08/18/22  0230 08/17/22  0441   Sodium 140 141 139 137 141 140   Potassium 5.1 4.5 4.8 5.2 4.1 4.9   Chloride 100 98* 100 100 102 101   CO2 22 23 23 23 24 23    BUN 67* 61* 52* 42* 18 39*   Creatinine 8.0* 6.9* 6.6* 5.3* 2.7* 4.8*   Glucose 73 60* 76 82 128* 88   Calcium 8.4 9.0 9.1 10.0 8.6 8.9   Magnesium 2.0 2.1 2.0  --  1.7 1.9       Recent Labs   Lab 08/23/22  0438 08/21/22  1424 08/18/22  0230   ALT  --  13 10   AST (SGOT)  --  13 10   Bilirubin, Total  --  0.3 0.3   Albumin 1.5* 1.6* 1.9*   Alkaline Phosphatase  --  123* 108       Recent Labs   Lab 08/23/22  1637   INR 1.7   PT 19.8*   PTT 30       Microbiology Results (last 15 days)       Procedure Component Value Units Date/Time    Culture, Blood, Aerobic And Anaerobic [[id number]] Collected: 08/11/22 0208    Order Status: Completed Specimen: Blood, Venous Updated: 08/16/22 0700     Culture Blood No growth at 5 days    Culture, Blood, Aerobic And Anaerobic [[id number]] Collected: 08/11/22 0208    Order Status: Completed Specimen: Blood, Venous Updated: 08/16/22 0700     Culture Blood No growth at 5 days             RADIOLOGY     Radiological Procedure personally reviewed and concur with radiologist reports unless stated otherwise.    Tunneled Cath Check/Change (Permcath)   Final Result      Right-sided PermCath exchange with new 14.5 French 19 cm PermCath with tip   in the right atrium.      PLAN:    Catheter ready for immediate use.      Darci Current, MD   08/23/2022 12:49 PM      XR Chest  AP  Portable   Final Result      No significant change mild pulmonary vascular congestion and mild basal   atelectasis. Hiatal hernia.      Clide Cliff, MD   08/21/2022 2:35 PM          Signed,  Kathrene Bongo, FNP-BC, AGACNP-BC  6:12 PM 08/23/2022

## 2022-08-23 NOTE — Progress Notes (Addendum)
Unable to present IMM/Acknowledgment of Outpatient/Observation Status Form or OBS letter in person due to (check what applies):    Patient is on COVID/TB isolation __N/A__  Patient is confused/intubated/unable to participate ___X___      Sherron Monday to patient/family member [name] patient daughter via phone to review 1st IMM Letter who acknowledged and verbalized understanding of the letter.  A copy of 1st IMM Letter will be mailed to patient's residence.      Prudencio Pair, CMS

## 2022-08-23 NOTE — Progress Notes (Signed)
ROC referral from hosp RN CC Samara Deist  - pt admitted from [facility] and will d/c back to same facility with on-site HD.     Gypsy Decant, HD Care Coordinator  [phone]

## 2022-08-23 NOTE — Consults (Signed)
Wound Ostomy Continence Consultation / Progress Note    Date Time: 08/23/22 12:26 PM  Patient Name: PEACE, YAGER  Consulting Service: [facility] Day: 3     Reason for Consult / Follow Up   Surgical Wound, Vac    Assessment & Plan   Assessment:    08/23/22 1200   Integumentary   Integumentary (WDL) X   General Skin Color Appropriate for ethnicity   Skin Temp Warm   Skin Assessment Blanchable Redness;Flaky;Moisture Associated Skin Damage   Image    Cracking Skin Location Bilateral feet   Blanchable Redness Location Bilateral heels   MASD/IAD Location Buttocks/Sacrum   Treatment Moisturizer   MASD/IAD Assessment Denuded;Dry;Red;Pink  (Hyperpigmentation)   Flaky Location Bilateral feet     Wound 06/30/22 Abdomen Anterior (Active)   Date First Assessed/Time First Assessed: 06/30/22 1152   Wound Type: Surgical Incision  Location: Abdomen  Wound Location Orientation: Anterior      Assessments 08/23/2022 12:00 PM   Wound Image      Wound Base Description Adipose;Agranular;Necrotic;Eschar;Tan;Yellow;Red;Pink   % Granular 60   % Non-viable 40   Peri-wound Description Clean;Dry;Intact;Hyperpigmented   Wound Length (cm) 18 cm   Wound Width (cm) 14 cm   Wound Depth (cm) 6 cm   Wound Surface Area (cm^2) 252 cm^2   Wound Volume (cm^3) 1512 cm^3   Drainage Amount Small   Drainage Description Tan/Brown;Thin;Serosanguinous;Malodorous   Tunneling No   Undermining  No   Margins Closed   Treatments Cleansed   Topical Dakins Solution   Dressing Gauze;Bordered Foam      Plan/Follow-Up:   Recommend Dakins q12hrs to the abdominal surgical incision  Recommend Surgery evaluation for possible debridement of the abdominal surgical wound  Would not recommend replacing NPWT dressing until abdominal wound undergoes debridement  Recommend Aquaphor to the bilateral medial buttocks and BLE q12hrs  Recommend continued PI prevention interventions    History of Present Illness   This is a 82 y.o. female  has a past medical history of Acid  reflux, Asthma, well controlled, Cancer of kidney, Chronic lower back pain, Congestive heart disease, COPD (chronic obstructive pulmonary disease), CVA (cerebral infarction) (01/12/2003), Diabetes, Diabetic nephropathy, Fibroids, Gout, H/O: gout, Hyperlipidemia, Hypertension, Neuropathy of hand, and SOB (shortness of breath)..  Admitted with Hemodialysis catheter dysfunction, initial encounter.        Procedure(s) with comments:  TUNNELED CATH CHECK/CHANGE-permacath exchange - exchange permacath - non-functional    Day of Surgery  -------------------        Betsey Amen MSN, RN, [facility]  Wound and Ostomy Service  [phone]

## 2022-08-23 NOTE — Plan of Care (Signed)
Hemodialysis Initiated to correct and control fluid and electrolyte imbalances      Problem: Renal Instability  Goal: Fluid and electrolyte balance are achieved/maintained  Flowsheets (Taken 08/23/2022 1215)  Fluid and electrolyte balance are achieved/maintained:   Monitor/assess lab values and report abnormal values   Assess and reassess fluid and electrolyte status   Monitor for muscle weakness   Follow fluid restrictions/IV/PO parameters   Observe for cardiac arrhythmias

## 2022-08-23 NOTE — Plan of Care (Addendum)
Md/provider aware Sepsis screen was triggered per EPIC. Pt in no distress. No further acute interventions needed at present.     Problem: Compromised Moisture  Goal: Moisture level Interventions  Outcome: Progressing     Problem: Compromised Activity/Mobility  Goal: Activity/Mobility Interventions  Outcome: Progressing  Flowsheets (Taken 08/23/2022 0905)  Activity/Mobility Interventions: Pad bony prominences, TAP Seated positioning system when OOB, Promote PMP, Reposition q 2 hrs / turn clock, Offload heels     Problem: Compromised Nutrition  Goal: Nutrition Interventions  Outcome: Progressing     Problem: Compromised Friction/Shear  Goal: Friction and Shear Interventions  Outcome: Progressing  Flowsheets (Taken 08/23/2022 0905)  Friction and Shear Interventions: Pad bony prominences, Off load heels, HOB 30 degrees or less unless contraindicated, Consider: TAP seated positioning, Heel foams     Problem: Impaired Mobility  Goal: Mobility/Activity is maintained at optimal level for patient  Outcome: Progressing  Flowsheets (Taken 08/21/2022 1832 by Illene Regulus A, RN)  Mobility/activity is maintained at optimal level for patient:   Encourage independent activity per ability   Consult/collaborate with Physical Therapy and/or Occupational Therapy     Problem: Compromised skin integrity  Goal: Skin integrity is maintained or improved  Outcome: Progressing  Flowsheets (Taken 08/23/2022 0905)  Skin integrity is maintained or improved: Keep skin clean and dry

## 2022-08-23 NOTE — Malnutrition Assessment (Signed)
Shelby Barron is a 82 y.o. female patient.   [id number]    Malnutrition Assessment   Malnutrition Documentation    Severe Malnutrition related to inadequate oral intake in the setting of acute illness as evidenced by 9% weight loss x 1 month,  moderate muscle wasting (temporalis, pectoralis, deltoid) and moderate fat loss (orbital, buccal, triceps)        Jilda Roche MS, RDN  Clinical Dietitian PRN    If physician disagrees with this assessment see addendum.

## 2022-08-23 NOTE — Plan of Care (Signed)
Problem: Moderate/High Fall Risk Score >5  Goal: Patient will remain free of falls  Outcome: Progressing  Flowsheets (Taken 08/23/2022 2000)  High (Greater than 13):   HIGH-Consider use of low bed   HIGH-Apply yellow "Fall Risk" arm band   HIGH-Utilize chair pad alarm for patient while in the chair   HIGH-Bed alarm on at all times while patient in bed   HIGH-Visual cue at entrance to patient's room   MOD-Utilize diversion activities   MOD-Remain with patient during toileting   MOD-Consider a move closer to Nurses Station   MOD-Use of chair-pad alarm when appropriate   LOW-Fall Interventions Appropriate for Low Fall Risk     Problem: Compromised Sensory Perception  Goal: Sensory Perception Interventions  Outcome: Progressing  Flowsheets (Taken 08/23/2022 2000)  Sensory Perception Interventions: Offload heels, Pad bony prominences, Reposition q 2hrs/turn Clock, Q2 hour skin assessment under devices if present     Problem: Compromised Moisture  Goal: Moisture level Interventions  Outcome: Progressing  Flowsheets (Taken 08/23/2022 2000)  Moisture level Interventions: Moisture wicking products, Moisture barrier cream     Problem: Compromised Activity/Mobility  Goal: Activity/Mobility Interventions  Outcome: Progressing  Flowsheets (Taken 08/23/2022 2000)  Activity/Mobility Interventions: Pad bony prominences, TAP Seated positioning system when OOB, Promote PMP, Reposition q 2 hrs / turn clock, Offload heels     Problem: Compromised Nutrition  Goal: Nutrition Interventions  Outcome: Progressing  Flowsheets (Taken 08/23/2022 2000)  Nutrition Interventions: Discuss nutrition at Rounds, I&Os, Document % meal eaten, Daily weights     Problem: Compromised Friction/Shear  Goal: Friction and Shear Interventions  Outcome: Progressing  Flowsheets (Taken 08/23/2022 2000)  Friction and Shear Interventions: Pad bony prominences, Off load heels, HOB 30 degrees or less unless contraindicated, Consider: TAP seated positioning, Heel foams      Problem: Renal Instability  Goal: Fluid and electrolyte balance are achieved/maintained  Outcome: Progressing  Flowsheets (Taken 08/23/2022 2300)  Fluid and electrolyte balance are achieved/maintained:   Monitor/assess lab values and report abnormal values   Assess and reassess fluid and electrolyte status   Observe for cardiac arrhythmias   Monitor for muscle weakness     Problem: Impaired Mobility  Goal: Mobility/Activity is maintained at optimal level for patient  Outcome: Progressing  Flowsheets (Taken 08/23/2022 2300)  Mobility/activity is maintained at optimal level for patient: Encourage independent activity per ability     Problem: Peripheral Neurovascular Impairment  Goal: Extremity color, movement, sensation are maintained or improved  Outcome: Progressing  Flowsheets (Taken 08/23/2022 2300)  Extremity color, movement, sensation are maintained or improved:   Assess and monitor application of corrective devices (cast, brace, splint), check skin integrity   Assess extremity for proper alignment     Problem: Compromised skin integrity  Goal: Skin integrity is maintained or improved  Outcome: Progressing  Flowsheets (Taken 08/23/2022 2300)  Skin integrity is maintained or improved:   Assess Braden Scale every shift   Turn or reposition patient every 2 hours or as needed unless able to reposition self   Increase activity as tolerated/progressive mobility   Relieve pressure to bony prominences   Avoid shearing   Keep skin clean and dry   Collaborate with Wound, Ostomy, and Continence Nurse   Keep head of bed 30 degrees or less (unless contraindicated)     Problem: Pain interferes with ability to perform ADL  Goal: Pain at adequate level as identified by patient  Outcome: Progressing  Flowsheets (Taken 08/23/2022 2300)  Pain at adequate level as identified  by patient:   Identify patient comfort function goal   Assess for risk of opioid induced respiratory depression, including snoring/sleep apnea. Alert healthcare team  of risk factors identified.   Reassess pain within 30-60 minutes of any procedure/intervention, per Pain Assessment, Intervention, Reassessment (AIR) Cycle   Evaluate if patient comfort function goal is met   Assess pain on admission, during daily assessment and/or before any "as needed" intervention(s)   Evaluate patient's satisfaction with pain management progress   Offer non-pharmacological pain management interventions   Include patient/patient care companion in decisions related to pain management as needed

## 2022-08-24 ENCOUNTER — Encounter: Payer: Self-pay | Admitting: Diagnostic Radiology

## 2022-08-24 DIAGNOSIS — T8131XA Disruption of external operation (surgical) wound, not elsewhere classified, initial encounter: Secondary | ICD-10-CM

## 2022-08-24 LAB — RENAL FUNCTION PANEL
Albumin: 1.4 g/dL — ABNORMAL LOW (ref 3.5–5.0)
Anion Gap: 14 (ref 5.0–15.0)
BUN: 38 mg/dL — ABNORMAL HIGH (ref 7–21)
CO2: 24 mEq/L (ref 17–29)
Calcium: 8.2 mg/dL (ref 7.9–10.2)
Chloride: 106 mEq/L (ref 99–111)
Creatinine: 4.9 mg/dL — ABNORMAL HIGH (ref 0.4–1.0)
GFR: 8.2 mL/min/{1.73_m2} — ABNORMAL LOW (ref >=60.0)
Glucose: 115 mg/dL — ABNORMAL HIGH (ref 70–100)
Phosphorus: 3.6 mg/dL (ref 2.3–4.7)
Potassium: 4.3 mEq/L (ref 3.5–5.3)
Sodium: 144 mEq/L (ref 135–145)

## 2022-08-24 LAB — APTT
PTT: 99 s — ABNORMAL HIGH (ref 27–39)
PTT: 69 s — ABNORMAL HIGH (ref 27–39)
PTT: 50 s — ABNORMAL HIGH (ref 27–39)

## 2022-08-24 LAB — CBC
Absolute nRBC: 0 10*3/uL (ref <=0.00)
Hematocrit: 23.9 % — ABNORMAL LOW (ref 34.7–43.7)
Hemoglobin: 7.7 g/dL — ABNORMAL LOW (ref 11.4–14.8)
MCH: 29.3 pg (ref 25.1–33.5)
MCHC: 32.2 g/dL (ref 31.5–35.8)
MCV: 90.9 fL (ref 78.0–96.0)
MPV: 8.9 fL (ref 8.9–12.5)
Platelet Count: 427 10*3/uL — ABNORMAL HIGH (ref 142–346)
RBC: 2.63 10*6/uL — ABNORMAL LOW (ref 3.90–5.10)
RDW: 16 % — ABNORMAL HIGH (ref 11–15)
WBC: 16.2 10*3/uL — ABNORMAL HIGH (ref 3.10–9.50)
nRBC %: 0 /100 WBC (ref <=0.0)

## 2022-08-24 LAB — MAGNESIUM: Magnesium: 1.9 mg/dL (ref 1.6–2.6)

## 2022-08-24 NOTE — SLP Progress Note (Signed)
Speech Language Pathology  SLP Treatment Note    Patient: Shelby Barron    MRN#: [id number]   Unit: 4S CLINICAL DECISION UNIT  Bed: V409/W119-14    Recommendations:   Solids:  Minced and Moist (IDDSI MM5)  Liquids: Thin Liquids (IDDSI TN0) any modality  Meds: PO, crushed, with puree (IDDSI level 4)     Precautions/Compensations: elevate HOB, suction PRN, frequent oral care, alert and awake, upright positioning, alternate bites and sips, small single bites/sips, eat and drink slowly  Supervision: Feeding assistance needed    Plan of care: begin/continue oral diet, dysphagia treatment, and patient/family education  Assessment:       Patient with limited sustained attention this date and limited acceptance of PO trials. She required frequent attention cues to participate. Pt continues without overt s/sx of aspiration with thin liquids by single straw sips. O2 saturation remained 92-94% across trials. She did not accept solid PO during this session.     Education:   Risks and benefits of evaluation discussed with patient. Educated on role of speech therapy,  results of VFSS, plan of care, and goals of therapy and in agreement with POC. Educated on recommended diet, oral infection control, risks of aspiration, and s/s of aspiration. Requires reinforcement/repetition.     Subjective:   Interpreter utilized: no, not indicated     Patient is agreeable to participation in the therapy session. Patient's medical condition is appropriate for Speech therapy intervention at this time. No family at bedside.     PAIN: no     Behavior/cognition: awake, alert, calm , cooperative, and follows simple commands, poor sustained attention    Orientation: Oriented to self    Objective:   Current diet: Pureed solids (PU4) and Mildly Thick liquids (MT2)  Position: in bed and upright   Medical equipment in place: telemetry   Respiratory status: room air  Precautions: fall, bleeding, and skin and pressure ulcer    IDDSI oral trials:  Thin    (Th0)  via Straw Oral: WFL     Pharyngeal: no overt signs or symptoms of aspiration     Amount: 2 oz     Esophageal Phase: belching    Patient required frequent attention cues to continue session. She turned head away from offerings of solid PO this session.     Goals:   Goals  Patient will use lingual sweep to clear buccal cavity to reduce residue from oral cavity during p.o. intake: with minimal verbal instruction, in 5 sessions  Patient will demonstrate adequate oropharyngeal phase skills for a: Thin Liquids (IDDSI TN0), Soft and bite Sized (IDDSI SB6)    Patient left with call bell within reach, all needs met, bed alarm activated, and all questions answered. RN, Agustin Cree, notified via direct communication of session outcome and patient response including results and recommendations of dysphagia evaluation including but not limited to current functional status, amount of oral intake, level of supervision for oral intake, medication administration, diet and POC.    Treatment Time: Start Time: 0920 to Stop Time: 0938    Signature: Sid Falcon, M.S. CCC-SLP  [phone]

## 2022-08-24 NOTE — Plan of Care (Signed)
Problem: Moderate/High Fall Risk Score >5  Goal: Patient will remain free of falls  Outcome: Progressing  Flowsheets (Taken 08/24/2022 2000)  High (Greater than 13):   HIGH-Consider use of low bed   HIGH-Apply yellow "Fall Risk" arm band   HIGH-Utilize chair pad alarm for patient while in the chair   HIGH-Bed alarm on at all times while patient in bed   HIGH-Visual cue at entrance to patient's room   MOD-Request PT/OT consult order for patients with gait/mobility impairment   MOD-Re-orient confused patients   MOD-Use of assistive devices -Bedside Commode if appropriate   MOD-Remain with patient during toileting   MOD-Consider a move closer to Nurses Station   LOW-Fall Interventions Appropriate for Low Fall Risk     Problem: Compromised Sensory Perception  Goal: Sensory Perception Interventions  Outcome: Progressing  Flowsheets (Taken 08/24/2022 2000)  Sensory Perception Interventions: Offload heels, Pad bony prominences, Reposition q 2hrs/turn Clock, Q2 hour skin assessment under devices if present     Problem: Compromised Moisture  Goal: Moisture level Interventions  Outcome: Progressing  Flowsheets (Taken 08/24/2022 2000)  Moisture level Interventions: Moisture wicking products, Moisture barrier cream     Problem: Compromised Activity/Mobility  Goal: Activity/Mobility Interventions  Outcome: Progressing  Flowsheets (Taken 08/24/2022 2000)  Activity/Mobility Interventions: Pad bony prominences, TAP Seated positioning system when OOB, Promote PMP, Reposition q 2 hrs / turn clock, Offload heels     Problem: Compromised Nutrition  Goal: Nutrition Interventions  Outcome: Progressing  Flowsheets (Taken 08/24/2022 2000)  Nutrition Interventions: Discuss nutrition at Rounds, I&Os, Document % meal eaten, Daily weights     Problem: Compromised Friction/Shear  Goal: Friction and Shear Interventions  Outcome: Progressing  Flowsheets (Taken 08/24/2022 2000)  Friction and Shear Interventions: Pad bony prominences, Off load heels, HOB 30  degrees or less unless contraindicated, Consider: TAP seated positioning, Heel foams     Problem: Renal Instability  Goal: Fluid and electrolyte balance are achieved/maintained  Outcome: Progressing  Flowsheets (Taken 08/23/2022 2300)  Fluid and electrolyte balance are achieved/maintained:   Monitor/assess lab values and report abnormal values   Assess and reassess fluid and electrolyte status   Observe for cardiac arrhythmias   Monitor for muscle weakness     Problem: Impaired Mobility  Goal: Mobility/Activity is maintained at optimal level for patient  Outcome: Progressing  Flowsheets (Taken 08/23/2022 2300)  Mobility/activity is maintained at optimal level for patient: Encourage independent activity per ability     Problem: Peripheral Neurovascular Impairment  Goal: Extremity color, movement, sensation are maintained or improved  Outcome: Progressing  Flowsheets (Taken 08/23/2022 2300)  Extremity color, movement, sensation are maintained or improved:   Assess and monitor application of corrective devices (cast, brace, splint), check skin integrity   Assess extremity for proper alignment     Problem: Compromised skin integrity  Goal: Skin integrity is maintained or improved  Outcome: Progressing  Flowsheets (Taken 08/23/2022 2300)  Skin integrity is maintained or improved:   Assess Braden Scale every shift   Turn or reposition patient every 2 hours or as needed unless able to reposition self   Increase activity as tolerated/progressive mobility   Relieve pressure to bony prominences   Avoid shearing   Keep skin clean and dry   Collaborate with Wound, Ostomy, and Continence Nurse   Keep head of bed 30 degrees or less (unless contraindicated)     Problem: Pain interferes with ability to perform ADL  Goal: Pain at adequate level as identified by patient  Outcome:  Progressing  Flowsheets (Taken 08/23/2022 2300)  Pain at adequate level as identified by patient:   Identify patient comfort function goal   Assess for risk of opioid  induced respiratory depression, including snoring/sleep apnea. Alert healthcare team of risk factors identified.   Reassess pain within 30-60 minutes of any procedure/intervention, per Pain Assessment, Intervention, Reassessment (AIR) Cycle   Evaluate if patient comfort function goal is met   Assess pain on admission, during daily assessment and/or before any "as needed" intervention(s)   Evaluate patient's satisfaction with pain management progress   Offer non-pharmacological pain management interventions   Include patient/patient care companion in decisions related to pain management as needed

## 2022-08-24 NOTE — Progress Notes (Signed)
[facility] PROGRESS NOTE  [id number]      Date Time: 08/24/22 10:23 AM  Patient Name: Shelby Barron  Attending Physician: Regina Eck, MD    CC: follow-up ESRD    Assessment:     1) Malfunctioning permcath - s/p replacement 08/23/22  2) [name] MWF  3) Anemia due to CKD  4) Deconditioning s/p prolonged hospitalization at [place]     Recommendations:     1)  HD MWF per routine  2) Stable for [place] after HD today      Case discussed with: patient, HD RN      Glenard Haring, MD  [facility]  [id number] (office)      Subjective:   S/p permcath exchange.  Feels Ok.        Physical Exam:     Vitals:    08/23/22 2330 08/24/22 0354 08/24/22 0535 08/24/22 0805   BP: 113/48 124/51  125/60   Pulse: 64 77  79   Resp: 18 21  22    Temp: 98.4 F (36.9 C) 98 F (36.7 C)  98.8 F (37.1 C)   TempSrc: Axillary Axillary  Oral   SpO2: 96% 97%  94%   Weight:   81.9 kg (180 lb 8.9 oz)    Height:           Intake and Output Summary (Last 24 hours) at Date Time    Intake/Output Summary (Last 24 hours) at 08/24/2022 1023  Last data filed at 08/23/2022 1600  Gross per 24 hour   Intake --   Output 1100 ml   Net -1100 ml       General: awake, alert, flat affect  Cardiovascular: regular rate and rhythm, no murmurs, rubs or gallops  Lungs: clear to auscultation anteriorly  Abdomen: soft, non-tender, non-distended, normoactive bowel sounds  Extremities: no LE edema  RUE with ecchymoses    Meds:      Scheduled Meds: PRN Meds:    amiodarone, 200 mg, Oral, Daily  atorvastatin, 40 mg, Oral, Daily  b complex-vitamin c-folic acid, 1 tablet, Oral, Daily  carvedilol, 12.5 mg, Oral, Q12H SCH  cinacalcet, 30 mg, Oral, QPM  megestrol, 400 mg, Oral, BID Meals  midodrine, 5 mg, Oral, BID Meals  montelukast, 10 mg, Oral, QHS  oxybutynin XL, 5 mg, Oral, Daily  pantoprazole, 40 mg, Oral, Daily  petrolatum, , Topical, Q12H SCH  sevelamer carbonate, 800 mg, Oral, TID MEALS  sodium hypochlorite, , Irrigation, Q12H  SCH  umeclidinium-vilanterol, 1 puff, Inhalation, QAM           acetaminophen, 650 mg, Q4H PRN  benzocaine-menthol, 1 lozenge, Q2H PRN  benzonatate, 100 mg, TID PRN  carboxymethylcellulose sodium, 1 drop, TID PRN  dextrose, 15 g of glucose, PRN   Or  dextrose, 12.5 g, PRN   Or  dextrose, 12.5 g, PRN   Or  glucagon (rDNA), 1 mg, PRN  heparin (porcine), 3,000 Units, PRN  melatonin, 3 mg, QHS PRN  morphine, 2 mg, Q8H PRN  naloxone, 0.2 mg, PRN  oxyCODONE, 5 mg, Q6H PRN  polyethylene glycol, 17 g, Daily PRN  saline, 2 spray, Q4H PRN              Labs:     Recent Labs   Lab 08/24/22  0354 08/23/22  0438 08/22/22  0333   WBC 16.20* 17.19* 16.64*   Hemoglobin 7.7* 8.3* 8.4*   Hematocrit 23.9* 25.8* 25.8*   Platelet Count 427* 433*  457*     Recent Labs   Lab 08/24/22  0355 08/23/22  0438 08/22/22  0333 08/21/22  1424   Sodium 144 140 141 139   Potassium 4.3 5.1 4.5 4.8   Chloride 106 100 98* 100   CO2 24 22 23 23    BUN 38* 67* 61* 52*   Creatinine 4.9* 8.0* 6.9* 6.6*   Calcium 8.2 8.4 9.0 9.1   Albumin 1.4* 1.5*  --  1.6*   Phosphorus 3.6 5.1*  --  4.3   Magnesium 1.9 2.0 2.1 2.0   Glucose 115* 73 60* 76   GFR 8.2* 4.6* 5.4* 5.7*             Invalid input(s): "LEUKOCYTESUR"        Imaging personally reviewed, including: Tunneled Cath Check/Change (Permcath)    Result Date: 08/23/2022  Right-sided PermCath exchange with new 14.5 French 19 cm PermCath with tip in the right atrium. PLAN: Catheter ready for immediate use. Darci Current, MD 08/23/2022 12:49 PM         Signed by: Glenard Haring, MD

## 2022-08-24 NOTE — Progress Notes (Signed)
[facility]  HOSPITALIST  PROGRESS NOTE      Patient: Shelby Barron  Date: 08/24/2022   LOS: 2 Days  Admission Date: 08/21/2022   MRN: [id number]  Attending:  Regina Eck, MD     ASSESSMENT/PLAN     KYNSLEE BETSCH is a 82 y.o. female with PMH AF on Eliquis, chronic HFpEF, HTN, HLD, h/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 during complicated admission at [facility], [name], RCC s/p bilateral nephrectomy, urothelial CA s/p cystectomy c/b small bowel enterotomy s/p small bowel anastomosis and abdominal closure requiring wound vac, anemia of chronic disease, COPD, h/o T2DM (last A1c 5.6 in June 2024), dysphagia, gout, admitted with malfunctioning HD catheter with need for HD and non-healing abdominal surgical wound.    Malfunctioning HD Catheter  [name] MWF  H/o RCC s/p bilateral nephrectomy  Urothelial CA s/p cystectomy  - Appreciate IR and [facility] consult and recommendations  - s/p Permacath exchange in IR 08/23/22  - s/p HD on 08/23/22, continue MWF HD schedule  - Continue Nephrovite, cinacalcet, sevelamer  - Continue midodrine for hypotension (hold for SBP>120)    SIRS present on admission  - SIRS present on admission with persistent leukocytosis, tachycardia  - CXR without infectious etiology, patient anuric w/out obvious signs of infection  - Monitor for fever    Non-healing abdominal surgical wound   Small bowel enterotomy s/p small bowel anastomosis, abdominal closure, wound vac  - Appreciate Surgery and WOCN consult and recommendations after malfunctioning wound vac removed  - Continue heparin gtt and allow for Eliquis washout for 48 hours prior to any procedures, last dose Eliquis evening 08/22/22   - Stop heparin gtt at 5am 08/25/22 in preparation for I&D in OR under moderate sedation  - Pain management: morphine 2mg  IV q8h prn, oxycodone 5mg  po q6h prn     Anemia of chronic kidney disease, stable  Iron deficiency anemia  Thrombocytosis, suspect reactive  - Hb stable at b/l  - Iron  studies from 07/18/22 reviewed, not on supplementation  - Monitor daily CBC    COPD  - Continue montelukast, Anoro    Dysphagia  - Appreciate SLP consult and recommendations  - Continue PPI  - Aspiration precautions    AF on Eliquis  Chronic HFpEF  HTN  HLD  H/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  - Continue amiodarone 200mg  po daily  - Continue atorvastatin 40mg  qHS  - Hold Eliquis 2.5 mg po BID, transition to heparin gtt in case of any procedures    Deconditioned state s/p prolonged hospitalisation  - PT/OT recommend SNF  - Fall precautions, skin precautions    Severe Protein Calorie Malnutrition related to inadequate oral intake in the setting of acute illness as evidenced by 9% weight loss x 1 month,  moderate muscle wasting (temporalis, pectoralis, deltoid) and moderate fat loss (orbital, buccal, triceps)   - Appreciate Nutrition consult and recommendations  - Continue Megace BID    Obesity Class I, BMI 30  - Lifestyle modifications impractical for largely bed-bound patient    Analgesia: Tylenol prn     FEN  Fluids: SL  Nutrition: Minced and Moist, thin liquids. NPO after MN    Monitoring:  Telemetry needed: Yes  Continuous pulse oximetry needed: No  Level of Care: 3      Safety Checklist  DVT prophylaxis: Chemical and / or mechanical ppx NOT indicated or contraindicated: Fully anticoagulated    Foley: Not present  IVs:  Peripheral IV   PT/OT: Not needed   Daily CBC & or Chem ordered: Yes, due to clinical and lab instability       Patient Lines/Drains/Airways Status       Active PICC Line / CVC Line / PIV Line / Drain / Airway / Intraosseous Line / Epidural Line / ART Line / Line / Wound / Pressure Ulcer / NG/OG Tube       Name Placement date Placement time Site Days    Permacath Catheter - Tunneled 07/14/22 Subclavian vein catheter Right 07/14/22  1404  -- 38    Peripheral IV 20 G Standard Anterior;Right Wrist --  --  Wrist  --    Wound 06/30/22 Surgical Incision Abdomen Other (Comment) WOUND VAC  applied 06/30/22  1152  Abdomen  52    Wound 07/23/22 Skin Tear Buttocks;Coccyx Inner R and L cheek, coccyx 07/23/22  --  Buttocks;Coccyx  30                       Code Status: full code    DISPO: assisted living facility    Family Contact: As listed in medical chart    Care Plan discussed with nursing, consultants       SUBJECTIVE     MATHEW COSCARELLI is largely non-verbal though tries to speak at times. Appears uncomfortable in pain from surgical incision site.  MEDICATIONS     Current Facility-Administered Medications   Medication Dose Route Frequency    amiodarone  200 mg Oral Daily    atorvastatin  40 mg Oral Daily    b complex-vitamin c-folic acid  1 tablet Oral Daily    carvedilol  12.5 mg Oral Q12H [facility]    cinacalcet  30 mg Oral QPM    megestrol  400 mg Oral BID Meals    midodrine  5 mg Oral BID Meals    montelukast  10 mg Oral QHS    oxybutynin XL  5 mg Oral Daily    pantoprazole  40 mg Oral Daily    petrolatum   Topical Q12H SCH    sevelamer carbonate  800 mg Oral TID MEALS    sodium hypochlorite   Irrigation Q12H SCH    umeclidinium-vilanterol  1 puff Inhalation QAM       ROS     All other systems not mentioned in subjective reviewed and negative - unable to obtain    PHYSICAL EXAM     Vitals:    08/24/22 1600   BP: 130/59   Pulse: 83   Resp: (!) 24   Temp: 98.7 F (37.1 C)   SpO2: 96%       Temperature: Temp  Min: 98 F (36.7 C)  Max: 98.8 F (37.1 C)  Pulse: Pulse  Min: 64  Max: 90  Respiratory: Resp  Min: 17  Max: 24  Non-Invasive BP: BP  Min: 113/48  Max: 130/59  Pulse Oximetry SpO2  Min: 94 %  Max: 97 %    Intake and Output Summary (Last 24 hours) at Date Time  No intake or output data in the 24 hours ending 08/24/22 1618    GEN APPEARANCE: Appears in pain at times.  Chronically ill, frail appearing  HEENT: Pupils round, equal, reactive. Anicteric sclera. Dry MM  NECK: supple  CVS: RRR, S1, S2. 2/6 systolic murmur  LUNGS: CTAB  ABD: +BS, soft NTND  EXT: Flaccid LUE paralysis with left hand mild  contracture  SKIN: Pale. Large non-healing abdominal surgical wound/incision with e/o necrotic tissue with thin tan drainage and malodor   NEURO: Flat affect. Largely non-verbal though attempts to speak.    LABS     Recent Labs   Lab 08/24/22  0354 08/23/22  0438 08/22/22  0333   WBC 16.20* 17.19* 16.64*   RBC 2.63* 2.80* 2.84*   Hemoglobin 7.7* 8.3* 8.4*   Hematocrit 23.9* 25.8* 25.8*   MCV 90.9 92.1 90.8   Platelet Count 427* 433* 457*       Recent Labs   Lab 08/24/22  0355 08/23/22  0438 08/22/22  0333 08/21/22  1424 08/20/22  1640 08/18/22  0230   Sodium 144 140 141 139 137 141   Potassium 4.3 5.1 4.5 4.8 5.2 4.1   Chloride 106 100 98* 100 100 102   CO2 24 22 23 23 23 24    BUN 38* 67* 61* 52* 42* 18   Creatinine 4.9* 8.0* 6.9* 6.6* 5.3* 2.7*   Glucose 115* 73 60* 76 82 128*   Calcium 8.2 8.4 9.0 9.1 10.0 8.6   Magnesium 1.9 2.0 2.1 2.0  --  1.7       Recent Labs   Lab 08/24/22  0355 08/23/22  0438 08/21/22  1424 08/18/22  0230   ALT  --   --  13 10   AST (SGOT)  --   --  13 10   Bilirubin, Total  --   --  0.3 0.3   Albumin 1.4* 1.5* 1.6* 1.9*   Alkaline Phosphatase  --   --  123* 108       Recent Labs   Lab 08/24/22  1151 08/24/22  0354 08/23/22  1637   INR  --   --  1.7   PT  --   --  19.8*   PTT 69* 99* 30       Microbiology Results (last 15 days)       Procedure Component Value Units Date/Time    Culture, Blood, Aerobic And Anaerobic [[id number]] Collected: 08/11/22 0208    Order Status: Completed Specimen: Blood, Venous Updated: 08/16/22 0700     Culture Blood No growth at 5 days    Culture, Blood, Aerobic And Anaerobic [[id number]] Collected: 08/11/22 0208    Order Status: Completed Specimen: Blood, Venous Updated: 08/16/22 0700     Culture Blood No growth at 5 days             RADIOLOGY     Radiological Procedure personally reviewed and concur with radiologist reports unless stated otherwise.    Tunneled Cath Check/Change (Permcath)   Final Result      Right-sided PermCath exchange with new 14.5 French 19 cm  PermCath with tip   in the right atrium.      PLAN:    Catheter ready for immediate use.      Darci Current, MD   08/23/2022 12:49 PM      XR Chest  AP Portable   Final Result      No significant change mild pulmonary vascular congestion and mild basal   atelectasis. Hiatal hernia.      Clide Cliff, MD   08/21/2022 2:35 PM          Signed,  Kathrene Bongo, FNP-BC, AGACNP-BC  4:18 PM 08/24/2022

## 2022-08-24 NOTE — OT Progress Note (Addendum)
Occupational Therapy Note    Occupational Therapy Treatment -[facility]    Post Acute Care Therapy Recommendations:   Discharge Recommendation: SNF    If above recommendation is not available, pt will need 24/7 (1-2 person) assistance with ADLs, IADLs, transfers and mobility for safe discharge.    DME needs IF patient is discharging home: Hospital bed, [facility], [place], [facility]     Assessment:  Patient: Shelby Barron    Bed: Z610/R604-54   Pt presents with increased participation and at times, verbal interaction with clinician (answering questions, c/o pain). Pt is dependent with all transfers and is able sit at the EOB x 5 minutes, with varying level of assist from Max A to Min A, however, unable to tolerate longer EOB tolerance at this time.     Goals:     Time For Goal Achievement: 5 visits  ADL Goals  Patient will feed self: Independent;5 visits (after s/u)  Patient will groom self: Supervision;5 visits  Mobility and Transfer Goals  Other Goal:  (Pt will complete supine<>sit w/ max assist in prep for EOB ADLs in 5 sessions.)  Neuro Re-Ed Goals  Pt will sit at edge of bed: Maximal Assist;for 3 minutes;to prepare for OOB tasks;5 visits      Continue plan of care.    Education:   Further education provided to patient on increasing time OOB, HEP, safety with mobility and ADLs, discharge instructions, home safety. Pt unable to verbalize understanding, continue to reiterate.     Interdisciplinary Communication:   Patient in bed and call bell within reach. Communicated with Agustin Cree, RN, via F2F regarding pts progress and outcome of OT session.    Treatment:    Time Calculation  OT Received On: 08/24/22  Start Time: 1450  Stop Time: 1515  Time Calculation (min): 25 min    Treatment # 2    Precautions and Contraindications:    Precautions  Weight Bearing Status: no restrictions  Fall Risks: High;History of fall(s);Impaired balance/gait;Impaired mobility;Impaired vision;Mental status change;Muscle weakness  Other  Precautions: (+) CVA, falls    Subjective:  .  "Oh, it hurts, it hurts".      Patient's medical condition is appropriate for Occupational Therapy intervention at this time.  Patient is agreeable to participation in the therapy session. Nursing clears patient for therapy.  Pain Assessment  Pain Assessment: PAINAD    Objective:  Patient is in bed with telemetry, pressure relief boots, peripheral IV, and indwelling urinary catheter in place.    Cognition/Neuro Status  Arousal/Alertness: Delayed responses to stimuli  Attention Span: Difficulty dividing attention  Orientation Level: Oriented to person  Memory: Unable to assess  Following Commands: Follows one step commands with repetition;50%  Safety Awareness: maximal verbal instruction  Insights: Not aware of deficits  Problem Solving: Unable to assess  Behavior: cooperative;calm;attentive  Motor Planning: decreased processing speed;decreased initiation;perseveration  Coordination: GMC impaired;FMC impaired    Functional Mobility  Rolling: Dependent  Scooting Transfers: Dependent  Supine to Sit Transfers: Dependent  Sit to Supine Transfers: Dependent  When rolling, cues to hold LUE and lift head. Trendelenburg and TAPS to roll and scoot to HOB.     Neuro Re-Ed  Balance Training: Elbow propping (static sitting) Pt with minimal LUE assistance, when elbow propping on LUE, pt able to tolerate and maintain balance; unable to push into midline.     Signature: Jennell Corner, OT

## 2022-08-24 NOTE — Plan of Care (Signed)
Problem: Moderate/High Fall Risk Score >5  Goal: Patient will remain free of falls  Outcome: Progressing  Flowsheets (Taken 08/24/2022 0800)  High (Greater than 13):   HIGH-Visual cue at entrance to patient's room   HIGH-Bed alarm on at all times while patient in bed   HIGH-Utilize chair pad alarm for patient while in the chair   HIGH-Apply yellow "Fall Risk" arm band   HIGH-Pharmacy to initiate evaluation and intervention per protocol   HIGH-Initiate use of floor mats as appropriate   HIGH-Consider use of low bed     Problem: Compromised Sensory Perception  Goal: Sensory Perception Interventions  Outcome: Progressing  Flowsheets (Taken 08/24/2022 0800)  Sensory Perception Interventions: Offload heels, Pad bony prominences, Reposition q 2hrs/turn Clock, Q2 hour skin assessment under devices if present

## 2022-08-24 NOTE — Consults (Signed)
CONSULTATION REPORT  [facility] [phone]    Consultation Date Time: 08/24/22 5:11 PM  Date of Admission: 09/02/2022     Requesting Physician: Regina Eck, MD  Consulting Physician: Alanda Amass MD  Consulting Service: General Surgery      Reason for Consultation:   Cellulitis & Abscess    Impression:   Problem List[1]    -06/28/22 Bilateral Open Radical Nephrectomy and LOA with Enterotomies (MD Celine Mans) requiring Small Bowel resection/ Abdomen left in discontinuity and Abthera wound vac applied (MD Karmaker).  -06/30/22 Small Bowel Anastomosis and Closure of Mesenteric Defect / Closure of Abdominal Wall; Wound Vac applied     Abdominal midline wound noted; pictures in Epic. Heparin gtt 2/2 A-Fib. Home Eliquis held on admission. BUN 38 / Crt 4.9.   Plan:     -NPO at midnight for OR wound debridement with MD Zollie Beckers  -Type and Cross  -Hold Heparin gtt 6hrs prior to OR time / 10:40am.   -Remainder of plan per primary team    Pt seen with MD Orvell Careaga    History of Present Illness:   Shelby Barron is a 82 y.o. female who  has a past medical history of Acid reflux, Asthma, well controlled, Cancer of kidney, Chronic lower back pain, Congestive heart disease, COPD (chronic obstructive pulmonary disease), CVA (cerebral infarction) (01/12/2003), Diabetes, Diabetic nephropathy, Fibroids, Gout, H/O: gout, Hyperlipidemia, Hypertension, Neuropathy of hand, and SOB (shortness of breath)..      She presented to the hospital due to malfunctioning HD catheter and upon arrival and initial assessment / removal of wound vac noted bilateral lower abdominal wound beds with necrotic eschar tan and yellow to color.  Symptoms began  patient and family unaware when this began .  It is located in the bilateral lower portion of abdominal incision. Associated symptoms: none. The patient denies fever, chills, nausea, vomiting, and any active drainage from wound bed.  The patient admits to the following precipitating factors: none known.  Previous  treatments include: wound vac therapy s/p surgical intervention at [facility].   Symptoms have been stable since.      Past Medical History:     Past Medical History:   Diagnosis Date    Acid reflux     Asthma, well controlled     Cancer of kidney     Chronic lower back pain     Congestive heart disease     COPD (chronic obstructive pulmonary disease)     CVA (cerebral infarction) 01/12/2003    Diabetes     Diabetic nephropathy     Fibroids     Gout     H/O: gout     Hyperlipidemia     Hypertension     Neuropathy of hand     SOB (shortness of breath)        Past Surgical History:     Past Surgical History:   Procedure Laterality Date    APPENDECTOMY (OPEN)      CLOSURE, ENTEROTOMY, SMALL INTESTINE  06/28/2022    Procedure: CLOSURE, ENTEROTOMY, SMALL INTESTINE;  Surgeon: Marlaine Hind, MD;  [facility];  Service: General;;    CYSTECTOMY, ILEOCONDUIT N/A 06/28/2022    Procedure: CYSTECTOMY, RADICAL;  Surgeon: Neldon Newport, MD;  [facility];  Service: Urology;  Laterality: N/A;    DRAIN (OTHER) N/A 05/31/2022    Procedure: DRAIN (OTHER);  Surgeon: Hope Pigeon, MD;  [facility]: [facility];  Service: Interventional Radiology;  Laterality: N/A;  EXPLORATORY LAPAROTOMY N/A 06/30/2022    Procedure: SMALL BOWEL ANASTOMOSIS, CLOSURE OF MESENTERIC DEFECT, CLOSURE OF ABDOMINAL WALL;  Surgeon: Marlaine Hind, MD;  [facility];  Service: General;  Laterality: N/A;  **[id number]/ MD AVAIL [id number] OR AFTER 1600**    EXPLORATORY LAPAROTOMY, RESECTION SMALL BOWEL N/A 06/28/2022    Procedure: RESECTION SMALL BOWEL;  Surgeon: Marlaine Hind, MD;  [facility];  Service: General;  Laterality: N/A;    HERNIA REPAIR      HYSTERECTOMY      LAPAROTOMY, NEPHRECTOMY Bilateral 06/28/2022    Procedure: BILATERAL OPEN RADICAL NEPHRECTOMY AND LYSIS OF ASHESION;  Surgeon: Neldon Newport, MD;  [facility]: [facility];  Service: Urology;  Laterality: Bilateral;    PARTIAL NEPHRECTOMY       tumor removed from kidney  cJanuary 2015    TUNNELED CATH CHECK/CHANGE [facility]) Right 09/06/2022    Procedure: TUNNELED CATH CHECK/CHANGE-permacath exchange;  Surgeon: Tamala Bari, MD;  Location: [facility];  Service: Interventional Radiology;  Laterality: Right;  exchange permacath - non-functional    TUNNELED CATH PLACEMENT (PERMCATH) N/A 06/01/2022    Procedure: TUNNELED CATH PLACEMENT;  Surgeon: Suszanne Finch, MD;  [facility];  Service: Interventional Radiology;  Laterality: N/A;    TUNNELED CATH PLACEMENT (PERMCATH) Right 07/14/2022    Procedure: TUNNELED CATH PLACEMENT;  Surgeon: Hope Pigeon, MD;  [facility];  Service: Interventional Radiology;  Laterality: Right;  right IJ tunneled HD catheter placement    TUNNELED CATH REMOVAL (PERMCATH) Right 07/14/2022    Procedure: TUNNELED CATH REMOVAL;  Surgeon: Hope Pigeon, MD;  [facility]: [facility];  Service: Interventional Radiology;  Laterality: Right;  right neck/chest tunneled HD catheter removal    TURBT      WOUND VAC APPLICATION N/A 06/28/2022    Procedure: Tawni Pummel WOUND VAC APPLICATION;  Surgeon: Marlaine Hind, MD;  [facility]: [facility];  Service: General;  Laterality: N/A;    WOUND VAC APPLICATION N/A 06/30/2022    Procedure: WOUND VAC APPLICATION;  Surgeon: Marlaine Hind, MD;  [facility];  Service: General;  Laterality: N/A;       Family History:     Family History   Problem Relation Age of Onset    Cancer Mother     Heart failure Father     Cancer Maternal Aunt     Diabetes Maternal Aunt     Cancer Paternal Aunt        Social History:     Social History     Socioeconomic History    Marital status: Married     Spouse name: Not on file    Number of children: Not on file    Years of education: Not on file    Highest education level: Not on file   Occupational History    Not on file   Tobacco Use    Smoking status: Former    Smokeless tobacco: Never   Vaping Use    Vaping status: Never Used   Substance and Sexual  Activity    Alcohol use: No    Drug use: No    Sexual activity: Not Currently     Partners: Male   Other Topics Concern    Not on file   Social History Narrative    Not on file     Social Determinants of Health     Financial Resource Strain: Patient Unable To Answer (08/17/2022)    Overall Financial Resource Strain (CARDIA)  Difficulty of Paying Living Expenses: Patient unable to answer   Food Insecurity: No Food Insecurity (05/26/2022)    Hunger Vital Sign     Worried About Running Out of Food in the Last Year: Never true     Ran Out of Food in the Last Year: Never true   Transportation Needs: Patient Unable To Answer (08/17/2022)    PRAPARE - Transportation     Lack of Transportation (Medical): Patient unable to answer     Lack of Transportation (Non-Medical): Patient unable to answer   Physical Activity: Patient Unable To Answer (08/17/2022)    Exercise Vital Sign     Days of Exercise per Week: Patient unable to answer     Minutes of Exercise per Session: Patient unable to answer   Stress: Patient Unable To Answer (08/17/2022)    [employer] of Occupational Health - Occupational Stress Questionnaire     Feeling of Stress : Patient unable to answer   Social Connections: Patient Unable To Answer (08/17/2022)    Social Connection and Isolation Panel [NHANES]     Frequency of Communication with Friends and Family: Patient unable to answer     Frequency of Social Gatherings with Friends and Family: Patient unable to answer     Attends Religious Services: Patient unable to answer     Active Member of Clubs or Organizations: Patient unable to answer     Attends Banker Meetings: Patient unable to answer     Marital Status: Patient unable to answer   Intimate Partner Violence: Not At Risk (05/26/2022)    Humiliation, Afraid, Rape, and Kick questionnaire     Fear of Current or Ex-Partner: No     Emotionally Abused: No     Physically Abused: No     Sexually Abused: No   Housing Stability: Patient Unable To  Answer (08/17/2022)    Housing Stability Vital Sign     Unable to Pay for Housing in the Last Year: Patient unable to answer     Number of Times Moved in the Last Year: Not on file     Homeless in the Last Year: Patient unable to answer       Allergies:   Allergies[2]    Medications:     Prior to Admission medications    Medication Sig Start Date End Date Taking? Authorizing Provider   acetaminophen (TYLENOL) 500 MG tablet Take 2 tablets (1,000 mg) by mouth 3 (three) times daily   Yes [provider]   amiodarone (PACERONE) 200 MG tablet Take 1 tablet (200 mg) by mouth daily This is a maintenance dose. 08/05/22  Yes Lennox Pippins, MD   apixaban (ELIQUIS) 2.5 MG Take 1 tablet (2.5 mg) by mouth every 12 (twelve) hours 08/05/22  Yes Dar, Keenan Bachelor, MD   atorvastatin (LIPITOR) 40 MG tablet Take 1 tablet (40 mg) by mouth daily   Yes [provider]   b complex-vitamin c-folic acid (NEPHRO-VITE) 0.8 MG Tab Take 1 tablet by mouth daily 08/08/22  Yes Dar, Keenan Bachelor, MD   carvedilol (COREG) 12.5 MG tablet Take 1 tablet (12.5 mg) by mouth every 12 (twelve) hours 08/05/22  Yes Dar, Keenan Bachelor, MD   cinacalcet (SENSIPAR) 30 MG tablet Take 1 tablet (30 mg) by mouth every evening   Yes [provider]   conjugated estrogens (PREMARIN) vaginal cream Place vaginally every other day At bedtime   Yes [provider]   megestrol (MEGACE) 40 MG tablet Take 1 tablet (40  mg) by mouth 4 (four) times daily 08/13/22  Yes Donzetta Sprung, MD   megestrol acetate (MEGACE) 400 MG/10ML Suspension Take 10 mLs (400 mg) by mouth daily 08/17/22  Yes Donzetta Sprung, MD   midodrine (PROAMATINE) 10 MG tablet Take 1 tablet (10 mg) by mouth Once daily every Tuesday, Thursday and Saturday morning  Patient taking differently: Take 1 tablet (10 mg) by mouth Once daily every Tuesday, Thursday and Saturday morning Hold for SBP > 140 08/14/22  Yes Donzetta Sprung, MD   midodrine (PROAMATINE) 5 MG tablet Take 1 tablet (5 mg) by mouth 2 (two) times  daily with meals 08/16/22  Yes Donzetta Sprung, MD   montelukast (SINGULAIR) 10 MG tablet Take 1 tablet (10 mg) by mouth nightly   Yes [provider]   ondansetron (Zofran) 4 MG tablet Take 1 tablet (4 mg) by mouth every 8 (eight) hours as needed for Nausea 06/04/22  Yes Yousuf, Munib A, MD   oxybutynin XL (DITROPAN-XL) 5 MG 24 hr tablet Take 1 tablet (5 mg) by mouth daily   Yes [provider]   pantoprazole (PROTONIX) 20 MG tablet Take 1 tablet (20 mg) by mouth every 12 (twelve) hours   Yes [provider]   polyethylene glycol (MIRALAX) 17 g packet Take 17 g by mouth daily as needed (constipation) 08/10/22  Yes Yousuf, Munib A, MD   polyethylene glycol (MIRALAX) 17 g packet Take 17 g by mouth daily 08/13/22  Yes Donzetta Sprung, MD   sevelamer carbonate (RENVELA) 800 MG tablet Take 1 tablet (800 mg) by mouth 3 (three) times daily with meals   Yes [provider]   traMADol (ULTRAM) 50 MG tablet Take 1 tablet (50 mg) by mouth every 8 (eight) hours as needed for Pain   Yes [provider]   umeclidinium-vilanterol (Anoro Ellipta) 62.5-25 MCG/ACT Aerosol Pwdr, Breath Activated Inhale 1 puff into the lungs daily   Yes [provider]       Review of Systems:   As per the HPI.  The patient denies any additional changes to their otic, opthalmologic, dermatologic, pulmonary, cardiac, gastrointestinal, genitourinary, musculoskeletal, hematologic, constitutional, or psychiatric systems.    Physical Exam:     Vitals:    08/24/22 1600   BP: 130/59   Pulse: 83   Resp: (!) 24   Temp: 98.7 F (37.1 C)   SpO2: 96%       General appearance - alert, well appearing, and in no distress  Mental status - alert, oriented to person, place, and time  Eyes - sclera anicteric, left eye normal, right eye normal  Mouth - mucous membranes moist, pharynx normal without lesions  Chest - no tachypnea, retractions or cyanosis  Heart - normal rate and regular rhythm  Abdomen - soft, nontender,  nondistended, no masses or organomegaly; wound pictures attached to Epic chart  Musculoskeletal - no joint tenderness, deformity or swelling  Extremities - no pedal edema noted  Skin - normal coloration and turgor, no rashes, no suspicious skin lesions noted    Labs:     Results       Procedure Component Value Units Date/Time    APTT [[id number]]  (Abnormal) Collected: 08/24/22 1151    Specimen: Blood, Venous Updated: 08/24/22 1215     PTT 69 sec     Magnesium [[id number]]  (Normal) Collected: 08/24/22 0355    Specimen: Blood, Venous Updated: 08/24/22 0426     Magnesium 1.9 mg/dL     Renal  Function Panel [[id number]]  (Abnormal) Collected: 08/24/22 0355    Specimen: Blood, Venous Updated: 08/24/22 0426     Glucose 115 mg/dL      Sodium 737 mEq/L      Potassium 4.3 mEq/L      Chloride 106 mEq/L      CO2 24 mEq/L      BUN 38 mg/dL      Calcium 8.2 mg/dL      Creatinine 4.9 mg/dL      Albumin 1.4 g/dL      Phosphorus 3.6 mg/dL      Anion Gap 10.6     GFR 8.2 mL/min/1.73 m2     APTT [[id number]]  (Abnormal) Collected: 08/24/22 0354    Specimen: Blood, Venous Updated: 08/24/22 0416     PTT 99 sec     CBC without Differential [[id number]]  (Abnormal) Collected: 08/24/22 0354    Specimen: Blood, Venous Updated: 08/24/22 0403     WBC 16.20 x10 3/uL      Hemoglobin 7.7 g/dL      Hematocrit 18.2 %      Platelet Count 427 x10 3/uL      MPV 8.9 fL      RBC 2.63 x10 6/uL      MCV 90.9 fL      MCH 29.3 pg      MCHC 32.2 g/dL      RDW 16 %      nRBC % 0.0 /100 WBC      Absolute nRBC 0.00 x10 3/uL     Whole Blood Glucose POCT [[id number]]  (Normal) Collected: 08/28/2022 2354    Specimen: Blood, Capillary Updated: 09/15/2022 2356     Whole Blood Glucose POCT 95 mg/dL             Rads:     Radiology Results (24 Hour)       Procedure Component Value Units Date/Time    Tunneled Cath Check/Change (Permcath) [id number] Collected: 09/15/2022 1243    Order Status: Completed Updated: 08/24/22 0543    Narrative:      PROCEDURE: Tunneled dialysis  catheter exchange under fluoroscopic guidance.    HISTORY: Malfunctioning tunnel dialysis catheter.    OPERATOR(S):  Attending VIR Physician(s): Darci Current, MD    CONTRAST:  Contrast agent: None  Contrast volume (mL): 0    RADIATION PARAMETERS:  Fluoroscopy time (minutes): 2.4  Reference air kerma (mGy): 9.8     TECHNIQUE/FINDINGS: Informed consent for the procedure including risks,  benefits and alternatives was obtained. A safety timeout was performed and  documented with all members of the procedure team present, including  verification of patient identification, correct procedure, procedure site,  and laterality. The patient was positioned supine.     Preparation: The site was prepared and draped using all elements of maximal  sterile barrier technique including sterile gloves, sterile gown, cap,  mask, large sterile sheet, sterile ultrasound probe cover, sterile  ultrasound gel, hand hygiene and cutaneous antisepsis with 2%  chlorhexidine.  Medical reason for site preparation exception: Not applicable.    Sedation: The patient was placed under no additional IV sedation which was  monitored by the performing provider     Local anesthesia was a Optician, dispensing. Indwelling PermCath was removed over wire.  A new 19 cm 14.5 [place] was advanced with the tip in the right atrium. Both  ports flush and aspirate appropriately. The catheter screws cannot  procedure sterile dressing was applied. The patient tolerated procedure  well.  All indicated images were saved to PACS.      Impression:        Right-sided PermCath exchange with new 14.5 French 19 cm PermCath with tip  in the right atrium.    PLAN:   Catheter ready for immediate use.    Darci Current, MD  09/09/2022 12:49 PM              Signed by: Tamala Bari, FNP    ___    I performed the substantive portion of this visit by personally conducting the MDM. Patient was also seen by Benard Halsted, NP on the date the note was created. The assessment and plan were discussed and  formulated together and has been reviewed and updated as needed.    Subjective: Patient non-verbal. Unable to obtain.    Vitals:    08/24/22 1600   BP: 130/59   Pulse: 83   Resp: (!) 24   Temp: 98.7 F (37.1 C)   SpO2: 96%       Gen: NAD  HEENT: EOMI, no scleral icterus  Pulm: Easy work of breathing, equal chest rise, no audible wheezing  Card: RRR  Abd: Soft, obese, midline laparotomy incision with extension to RLQ with necrotic and fibrinous debris of subcutaneous tissues. Fascia appears intact.  Skin: warm, dry, non-icteric  Psych: Appropriate mood and affect    Relevant Labs:  WBC 16  Hg 7.7  Plt 427  PTT 99  Cr 4.9  Albumin 1.4    A/P  62 F with asthma, CHF, COPD, CVA, DM, HLD, HTN, and kidney cancer s/p bilateral nephrectomy c/b bowel injury, left in discontinuity brought back for anastomosis and wound closure, now presenting with wound necrosis    -Given anticoagulation and risk of bleeding, will plan for wound debridement in OR tomorrow, possible wound vac reapplication vs wet-to-dry dressing post-op  -NPO p MN  -Hold heparin drip 6 hr prior to procedure  -Rest of plan per primary team    Alizon Schmeling K. Zacari Radick, MD  [facility]           [1]   Patient Active Problem List  Diagnosis    Type 2 diabetes mellitus with kidney complication, with long-term current use of insulin    Diabetes mellitus type II, uncontrolled    Neuropathy of hand    Left renal mass    Hamstring injury    Peritonitis    ESRD (end stage renal disease)    Chronic diastolic congestive heart failure    Aortic atherosclerosis    Malignant neoplasm of urinary bladder, unspecified site    Malignant tumor of kidney, left    Benign essential hypertension    GERD (gastroesophageal reflux disease)    Leukocytosis    Bladder cancer    Anemia    COPD (chronic obstructive pulmonary disease)    Hemodialysis catheter dysfunction, initial encounter    CKD (chronic kidney disease) requiring chronic dialysis    Surgical wound breakdown    [2]   Allergies  Allergen Reactions    Metrizamide Shortness Of Breath and Nausea Only    Rosuvastatin Other (See Comments)     myopathy    myopathy   myopathy    myopathy myopathy      myopathy    myopathy, myopathy    Iodinated Contrast Media Other (See Comments)     Nausea and SOB per patient   On Dialysisi    Nausea and SOB per  patient    Nausea and SOB per patient      Nausea and SOB per patient   Nausea and SOB per patient    Nausea and SOB per patient      Nausea and SOB per patient Nausea and SOB per patient      Nausea and SOB per patient Nausea and SOB per patient Nausea and SOB per patient      Nausea and SOB per patient  Nausea and SOB per patient Nausea and SOB per patient    Nausea and SOB per patient   Nausea and SOB per patient    Pioglitazone      Weight gain    Sulfa Antibiotics Hives     blister

## 2022-08-25 ENCOUNTER — Encounter: Admission: EM | Disposition: E | Payer: Self-pay | Source: Home / Self Care | Attending: Internal Medicine

## 2022-08-25 ENCOUNTER — Encounter: Payer: Self-pay | Admitting: Internal Medicine

## 2022-08-25 ENCOUNTER — Inpatient Hospital Stay: Payer: Medicare Other | Admitting: Certified Registered"

## 2022-08-25 DIAGNOSIS — S31109D Unspecified open wound of abdominal wall, unspecified quadrant without penetration into peritoneal cavity, subsequent encounter: Secondary | ICD-10-CM

## 2022-08-25 HISTORY — PX: DEBRIDEMENT & IRRIGATION, WOUND: SHX3697

## 2022-08-25 HISTORY — PX: WOUND VAC APPLICATION: SHX510117

## 2022-08-25 LAB — MAGNESIUM: Magnesium: 1.9 mg/dL (ref 1.6–2.6)

## 2022-08-25 LAB — CBC
Absolute nRBC: 0.02 10*3/uL — ABNORMAL HIGH (ref <=0.00)
Hematocrit: 25.6 % — ABNORMAL LOW (ref 34.7–43.7)
Hemoglobin: 8.2 g/dL — ABNORMAL LOW (ref 11.4–14.8)
MCH: 29.6 pg (ref 25.1–33.5)
MCHC: 32 g/dL (ref 31.5–35.8)
MCV: 92.4 fL (ref 78.0–96.0)
MPV: 9.2 fL (ref 8.9–12.5)
Platelet Count: 470 10*3/uL — ABNORMAL HIGH (ref 142–346)
RBC: 2.77 10*6/uL — ABNORMAL LOW (ref 3.90–5.10)
RDW: 16 % — ABNORMAL HIGH (ref 11–15)
WBC: 18.86 10*3/uL — ABNORMAL HIGH (ref 3.10–9.50)
nRBC %: 0.1 /100 WBC — ABNORMAL HIGH (ref <=0.0)

## 2022-08-25 LAB — TYPE AND SCREEN
ABO Rh: O POS
Antibody Screen: NEGATIVE

## 2022-08-25 LAB — RENAL FUNCTION PANEL
Albumin: 1.4 g/dL — ABNORMAL LOW (ref 3.5–5.0)
Anion Gap: 16 — ABNORMAL HIGH (ref 5.0–15.0)
BUN: 46 mg/dL — ABNORMAL HIGH (ref 7–21)
CO2: 22 mEq/L (ref 17–29)
Calcium: 8.1 mg/dL (ref 7.9–10.2)
Chloride: 104 mEq/L (ref 99–111)
Creatinine: 5.8 mg/dL — ABNORMAL HIGH (ref 0.4–1.0)
GFR: 6.7 mL/min/{1.73_m2} — ABNORMAL LOW (ref >=60.0)
Glucose: 124 mg/dL — ABNORMAL HIGH (ref 70–100)
Phosphorus: 3.9 mg/dL (ref 2.3–4.7)
Potassium: 4.8 mEq/L (ref 3.5–5.3)
Sodium: 142 mEq/L (ref 135–145)

## 2022-08-25 LAB — WHOLE BLOOD GLUCOSE POCT
Whole Blood Glucose POCT: 108 mg/dL — ABNORMAL HIGH (ref 70–100)
Whole Blood Glucose POCT: 101 mg/dL — ABNORMAL HIGH (ref 70–100)
Whole Blood Glucose POCT: 149 mg/dL — ABNORMAL HIGH (ref 70–100)

## 2022-08-25 LAB — APTT: PTT: 46 s — ABNORMAL HIGH (ref 27–39)

## 2022-08-25 SURGERY — DEBRIDEMENT & IRRIGATION, WOUND
Anesthesia: Anesthesia General | Site: Abdomen | Wound class: Contaminated

## 2022-08-25 MED ORDER — APIXABAN 2.5 MG PO TABS
2.5000 mg | ORAL_TABLET | Freq: Two times a day (BID) | ORAL | Status: DC
Start: 2022-08-26 — End: 2022-08-26
  Administered 2022-08-26: 2.5 mg via ORAL
  Filled 2022-08-25: qty 1

## 2022-08-25 MED ORDER — VANCOMYCIN PHARMACY TO DOSE PLACEHOLDER
INTRAVENOUS | Status: DC
Start: 2022-08-25 — End: 2022-08-27

## 2022-08-25 MED ORDER — STERILE WATER FOR INJECTION IJ/IV SOLN (WRAP)
1.0000 g | INTRAMUSCULAR | Status: DC
Start: 2022-08-26 — End: 2022-08-27
  Administered 2022-08-26 – 2022-08-27 (×2): 1 g via INTRAVENOUS
  Filled 2022-08-25 (×2): qty 1000

## 2022-08-25 MED ORDER — SODIUM CHLORIDE 0.9 % IV BOLUS
100.0000 mL | INTRAVENOUS | Status: AC | PRN
Start: 2022-08-25 — End: 2022-08-25

## 2022-08-25 MED ORDER — LACTATED RINGERS IV SOLN
INTRAVENOUS | Status: DC | PRN
Start: 2022-08-25 — End: 2022-08-25

## 2022-08-25 MED ORDER — LIDOCAINE-EPINEPHRINE 2 %-1:100000 IJ SOLN
INTRAMUSCULAR | Status: AC
Start: 2022-08-25 — End: ?
  Filled 2022-08-25: qty 40

## 2022-08-25 MED ORDER — CEFAZOLIN SODIUM 1 G IJ SOLR
INTRAMUSCULAR | Status: AC
Start: 2022-08-25 — End: ?
  Filled 2022-08-25: qty 1000

## 2022-08-25 MED ORDER — PROPOFOL INFUSION 10 MG/ML
INTRAVENOUS | Status: DC | PRN
Start: 2022-08-25 — End: 2022-08-25
  Administered 2022-08-25: 50 ug/kg/min via INTRAVENOUS

## 2022-08-25 MED ORDER — SODIUM CHLORIDE 0.9 % IV BOLUS
250.0000 mL | INTRAVENOUS | Status: AC | PRN
Start: 2022-08-25 — End: 2022-08-25

## 2022-08-25 MED ORDER — PHENYLEPHRINE 100 MCG/ML IV BOLUS (ANESTHESIA)
PREFILLED_SYRINGE | INTRAVENOUS | Status: DC | PRN
Start: 2022-08-25 — End: 2022-08-25
  Administered 2022-08-25 (×2): 100 ug via INTRAVENOUS

## 2022-08-25 MED ORDER — ROCURONIUM BROMIDE 50 MG/5ML IV SOLN
INTRAVENOUS | Status: AC
Start: 2022-08-25 — End: ?
  Filled 2022-08-25: qty 5

## 2022-08-25 MED ORDER — LIDOCAINE-EPINEPHRINE 2 %-1:200000 IJ SOLN
INTRAMUSCULAR | Status: DC | PRN
Start: 2022-08-25 — End: 2022-08-25
  Administered 2022-08-25: 20 mL

## 2022-08-25 MED ORDER — FENTANYL CITRATE (PF) 50 MCG/ML IJ SOLN (WRAP)
25.0000 ug | INTRAMUSCULAR | Status: DC | PRN
Start: 2022-08-25 — End: 2022-08-25
  Administered 2022-08-25: 25 ug via INTRAVENOUS

## 2022-08-25 MED ORDER — PROPOFOL 10 MG/ML IV EMUL (WRAP)
INTRAVENOUS | Status: AC
Start: 2022-08-25 — End: ?
  Filled 2022-08-25: qty 20

## 2022-08-25 MED ORDER — SODIUM CHLORIDE 0.9% BAG (IRRIGATION USE)
INTRAVENOUS | Status: DC | PRN
Start: 2022-08-25 — End: 2022-08-25
  Administered 2022-08-25: 1000 mL

## 2022-08-25 MED ORDER — ONDANSETRON HCL 4 MG/2ML IJ SOLN
4.0000 mg | Freq: Once | INTRAMUSCULAR | Status: AC | PRN
Start: 2022-08-25 — End: 2022-08-25
  Administered 2022-08-25: 4 mg via INTRAVENOUS

## 2022-08-25 MED ORDER — BUPIVACAINE-EPINEPHRINE (PF) 0.5% -1:200000 IJ SOLN
INTRAMUSCULAR | Status: DC | PRN
Start: 2022-08-25 — End: 2022-08-25
  Administered 2022-08-25: 20 mL via INTRAMUSCULAR

## 2022-08-25 MED ORDER — HYDROMORPHONE HCL 1 MG/ML IJ SOLN
0.5000 mg | INTRAMUSCULAR | Status: DC | PRN
Start: 2022-08-25 — End: 2022-08-25

## 2022-08-25 MED ORDER — MINERAL OIL LIGHT OIL
TOPICAL_OIL | Status: AC
Start: 2022-08-25 — End: ?
  Filled 2022-08-25: qty 2

## 2022-08-25 MED ORDER — FENTANYL CITRATE (PF) 50 MCG/ML IJ SOLN (WRAP)
INTRAMUSCULAR | Status: AC
Start: 2022-08-25 — End: ?
  Filled 2022-08-25: qty 2

## 2022-08-25 MED ORDER — HEPARIN (PORCINE) IN D5W 50-5 UNIT/ML-% IV SOLN (UNITS/KG/HR ONLY)
12.0000 [IU]/kg/h | INTRAVENOUS | Status: DC
Start: 2022-08-25 — End: 2022-08-26
  Administered 2022-08-25: 12 [IU]/kg/h via INTRAVENOUS
  Filled 2022-08-25: qty 500

## 2022-08-25 MED ORDER — LIDOCAINE HCL (PF) 2 % IJ SOLN
INTRAMUSCULAR | Status: AC
Start: 2022-08-25 — End: ?
  Filled 2022-08-25: qty 5

## 2022-08-25 MED ORDER — BUPIVACAINE-EPINEPHRINE (PF) 0.5% -1:200000 IJ SOLN
INTRAMUSCULAR | Status: AC
Start: 2022-08-25 — End: ?
  Filled 2022-08-25: qty 30

## 2022-08-25 MED ORDER — VANCOMYCIN 1000 MG IN 250 ML NS IVPB VIAL-MATE (CNR)
1000.0000 mg | Freq: Once | INTRAVENOUS | Status: AC
Start: 2022-08-26 — End: 2022-08-26
  Administered 2022-08-26: 1000 mg via INTRAVENOUS
  Filled 2022-08-25: qty 250

## 2022-08-25 MED ORDER — STERILE WATER FOR INJECTION IJ/IV SOLN (WRAP)
1.0000 g | Freq: Once | INTRAMUSCULAR | Status: AC
Start: 2022-08-25 — End: 2022-08-25
  Administered 2022-08-25: 1 g via INTRAVENOUS

## 2022-08-25 MED ORDER — ALBUMIN HUMAN 25 % IV SOLN
100.0000 mL | INTRAVENOUS | Status: AC | PRN
Start: 2022-08-25 — End: 2022-08-25
  Administered 2022-08-25 (×2): 25 g via INTRAVENOUS
  Filled 2022-08-25 (×2): qty 100

## 2022-08-25 SURGICAL SUPPLY — 55 items
ADHESIVE SKIN CLOSURE DERMABOND ADVANCED (Skin Closure) IMPLANT
ADHESIVE SKIN CLOSURE DERMABOND ADVANCED .7 ML LIQUID APPLICATOR (Skin Closure) IMPLANT
APPLICATOR SKIN CLOSURE (Skin Closure) IMPLANT
APPLICATOR SKIN CLSR NONHYPOALLERGENIC STERI-STRIP BNZN TINCTURE .6 ML (Skin Closure) IMPLANT
BANDAGE ADHESIVE L3 3/4 IN X W2 IN (Bandage) IMPLANT
BANDAGE ADHESIVE L3 3/4 IN X W2 IN STRETCH FLEXIBLE PAD XL CURITY (Bandage) IMPLANT
BLADE 15 CLASSIC CARBON STEEL TISSUE (Blade) IMPLANT
BLADE 15 CLASSIC CARBON STEEL TISSUE SURGICAL (Blade) IMPLANT
CANISTER WOUND DRAINAGE GEL TUBE CLAMP (Suction) ×1 IMPLANT
CANISTER WOUND DRAINAGE GEL TUBE CLAMP CONNECTOR INFOVAC SENSATRAC 500 (Suction) ×1 IMPLANT
DRAPE SURGICAL SHEET L77 IN X W53 IN (Drape) IMPLANT
DRAPE SURGICAL SHEET L77 IN X W53 IN MEDLINE SMS 3/4 BLUE (Drape) IMPLANT
DRESSING TRANSPARENT L4 3/4 IN X W4 IN (Dressing) IMPLANT
DRESSING TRANSPARENT L4 3/4 IN X W4 IN POLYURETHANE ADHESIVE (Dressing) IMPLANT
ELECTRODE ADULT PATIENT RETURN L9 FT REM POLYHESIVE ACRYLIC FOAM (Procedure Accessories) ×1 IMPLANT
ELECTRODE PATIENT RETURN L9 FT VALLEYLAB (Procedure Accessories) ×1 IMPLANT
GLOVE SURGICAL 7 1/2 BIOGEL PI (Glove) ×1 IMPLANT
GLOVE SURGICAL 7 1/2 BIOGEL PI ULTRATOUCH M POWDER FREE BEAD CUFF (Glove) ×1 IMPLANT
KIT DRESSING L17 CM X W14.5 CM THK1.75 (Dressing) ×1 IMPLANT
KIT DRESSING L17 CM X W14.5 CM THK1.75 CM MEDIUM VAC SIMPLACE (Dressing) ×1 IMPLANT
KIT INFECTION CONTROL CUSTOM (Kits) ×1 IMPLANT
KIT INFECTION CONTROL CUSTOM IFOH03 (Kits) ×1 IMPLANT
MANIFOLD SUCTION 2 STANDARD 4 PORT (Filter) ×1 IMPLANT
MANIFOLD SUCTION 2 STANDARD 4 PORT NEPTUNE 2 WASTE MANAGEMENT SYSTEM (Filter) ×1 IMPLANT
NEEDLE HPO SS PP RW BD 25GA 1.5IN LF (Needles) ×1 IMPLANT
SET HANDPIECE SUCTION TUBE HIGH FLOW TIP (Cautery) IMPLANT
SET HANDPIECE SUCTION TUBE HIGH FLOW TIP INTERPULSE (Cautery) IMPLANT
SOLUTION IRRIGATION 0.9% SODIUM CHLORIDE (Irrigation Solutions) ×1 IMPLANT
SOLUTION IRRIGATION 0.9% SODIUM CHLORIDE 1000 ML PLASTIC POUR BOTTLE (Irrigation Solutions) ×1 IMPLANT
SOLUTION PRP 4% CHG 4OZ SCR CR EXDN ANMC (Scrub Supplies) ×1 IMPLANT
SPONGE GAUZE L2 IN X W2 IN 4 PLY HIGH (Dressing) IMPLANT
SPONGE GAUZE L2 IN X W2 IN 4 PLY HIGH ABSORBENT NONWOVEN LINT FREE (Dressing) IMPLANT
SPONGE GAUZE L4 IN X W4 IN 12 PLY WOVEN (Dressing) IMPLANT
SPONGE GAUZE L4 IN X W4 IN 12 PLY WOVEN FOLD EDGE XRAY DETECT COTTON (Dressing) IMPLANT
STRIP SKIN CLOSURE L4 IN X W1/2 IN (Dressing) IMPLANT
STRIP SKIN CLOSURE L4 IN X W1/2 IN REINFORCE STERI-STRIP POLYESTER (Dressing) IMPLANT
SUTURE COATED VICRYL 2-0 SH L27 IN BRAID (Suture) IMPLANT
SUTURE COATED VICRYL 2-0 SH L27 IN BRAID COATED UNDYED ABSORBABLE (Suture) IMPLANT
SUTURE COATED VICRYL PLUS 3-0 SH L27 IN (Suture) ×1 IMPLANT
SUTURE COATED VICRYL PLUS 3-0 SH L27 IN BRAID ANTIBACTERIAL COATED (Suture) ×1 IMPLANT
SUTURE ETHILON BLACK 2-0 FS L18 IN (Suture) IMPLANT
SUTURE ETHILON BLACK 3-0 FS-1 L18 IN (Suture) IMPLANT
SUTURE ETHILON BLACK 3-0 FS-1 L18 IN MONOFILAMENT NONABSORBABLE (Suture) IMPLANT
SUTURE MONOCRYL 5-0 P-3 L18 IN (Suture) ×1 IMPLANT
SUTURE MONOCRYL 5-0 P-3 L18 IN MONOFILAMENT UNDYED ABSORBABLE (Suture) ×1 IMPLANT
SUTURE NYLON ETHILON BLACK SILVER 2-0 L18 IN L26 MM REVERSE CUTTING FS (Suture) IMPLANT
SYRINGE 10 ML GRADUATE NONPYROGENIC DEHP (Syringes, Needles) ×1 IMPLANT
SYRINGE 10 ML GRADUATE NONPYROGENIC DEHP FREE PVC FREE LOK MEDICAL (Syringes, Needles) ×1 IMPLANT
TAPE SURGICAL L10 YD X W6 IN STANDARD (Tape) ×1 IMPLANT
TOWEL L27 IN X W17 IN COTTON PREWASH (Other) IMPLANT
TOWEL L27 IN X W17 IN COTTON PREWASH DELINT HIGH ABSORBENT BLUE (Other) IMPLANT
TRAY SKIN SCRUB L8 IN 6 WING 6 SPONGE STICK 2 TIP APPLICATOR DRY VINYL (Prep) ×1 IMPLANT
TRAY SKIN SCRUB MEDLINE L8 IN VINYL (Prep) ×1 IMPLANT
TRAY SRG MAJOR PROC IFOH (Pack) ×1 IMPLANT
TRAY SURGICAL MAJOR (Pack) ×1 IMPLANT

## 2022-08-25 NOTE — Transfer of Care (Signed)
Anesthesia Transfer of Care Note    Patient: GLORIBEL MUNDT    Procedures performed: Procedure(s):  DEBRIDEMENT & IRRIGATION, WOUND  WOUND VAC APPLICATION    Anesthesia type: General TIVA    Patient location:Phase I PACU    Last vitals:   Vitals:    08/25/22 1252   BP: 120/58   Pulse: 80   Resp: 12   Temp: 36.2 C (97.2 F)   SpO2: 100%       Post pain: Patient not complaining of pain, continue current therapy      Mental Status:awake and alert     Respiratory Function: tolerating room air    Cardiovascular: stable    Nausea/Vomiting: patient not complaining of nausea or vomiting    Hydration Status: adequate    Post assessment: no apparent anesthetic complications, no reportable events, and no evidence of recall    Signed by: Cyndia Skeeters, CRNA  08/25/22 12:52 PM

## 2022-08-25 NOTE — Progress Notes (Signed)
GENERAL SURGERY PROGRESS NOTE  [facility] [phone]    Date Time: 08/25/22 8:59 AM  Patient Name: [facility] Day: 3    ASSESSMENT:   Problem List[1]  Malnutrition Documentation    Severe Malnutrition related to inadequate oral intake in the setting of acute illness as evidenced by 9% weight loss x 1 month,  moderate muscle wasting (temporalis, pectoralis, deltoid) and moderate fat loss (orbital, buccal, triceps)         -06/28/22 Bilateral Open Radical Nephrectomy and LOA with Enterotomies (MD Celine Mans) requiring Small Bowel resection/ Abdomen left in discontinuity and Abthera wound vac applied (MD Karmaker).  -06/30/22 Small Bowel Anastomosis and Closure of Mesenteric Defect / Closure of Abdominal Wall; Wound Vac applied      Abdominal midline wound noted; pictures in Epic.     Heparin gtt 2/2 A-Fib- held at 0500 per nursing. Home Eliquis held on admission.     HDQMWF    PLAN:   OR today for wound debridement  Discussed with pts daughter [name] via telephone- agreeable to proceed.    SUBJECTIVE:   The patient is doing fairly well. Reports generalized pain. Current dietary status:  Ensure Plus High Protein Supplement Quantity: A. One; Flavor: Strawberry; Frequency: TID (3 times a day) with meals  Juven Quantity: A. One; Frequency: BID (2 times a day) - with Breakfast and Dinner  Diet NPO time specified - Surgery/Procedure and tolerating well.       Objective:   Current Vitals:   Vitals:    08/25/22 0845   BP: 106/54   Pulse: 87   Resp: 19   Temp:    SpO2: 98%       Intake and Output Summary (Last 24 hours):  No intake/output data recorded.    Labs:     Results       Procedure Component Value Units Date/Time    Type and Screen [[id number]] Collected: 08/25/22 0558    Specimen: Blood, Venous Updated: 08/25/22 0702     ABO Rh O Pos     Antibody Screen Negative     Type And Screen Expiration 08/28/2022 23:59    Magnesium [[id number]]  (Normal) Collected: 08/25/22 0416    Specimen: Blood, Venous Updated: 08/25/22  0502     Magnesium 1.9 mg/dL     Renal Function Panel [[id number]]  (Abnormal) Collected: 08/25/22 0416    Specimen: Blood, Venous Updated: 08/25/22 0502     Glucose 124 mg/dL      Sodium 086 mEq/L      Potassium 4.8 mEq/L      Chloride 104 mEq/L      CO2 22 mEq/L      BUN 46 mg/dL      Calcium 8.1 mg/dL      Creatinine 5.8 mg/dL      Albumin 1.4 g/dL      Phosphorus 3.9 mg/dL      Anion Gap 57.8     GFR 6.7 mL/min/1.73 m2     APTT [[id number]]  (Abnormal) Collected: 08/25/22 0416    Specimen: Blood, Venous Updated: 08/25/22 0457     PTT 46 sec     CBC without Differential [[id number]]  (Abnormal) Collected: 08/25/22 0416    Specimen: Blood, Venous Updated: 08/25/22 0448     WBC 18.86 x10 3/uL      Hemoglobin 8.2 g/dL      Hematocrit 27.2 %      Platelet Count 470 x10 3/uL  MPV 9.2 fL      RBC 2.77 x10 6/uL      MCV 92.4 fL      MCH 29.6 pg      MCHC 32.0 g/dL      RDW 16 %      nRBC % 0.1 /100 WBC      Absolute nRBC 0.02 x10 3/uL     APTT [[id number]]  (Abnormal) Collected: 08/24/22 2015    Specimen: Blood, Venous Updated: 08/24/22 2147     PTT 50 sec     APTT [[id number]]  (Abnormal) Collected: 08/24/22 1151    Specimen: Blood, Venous Updated: 08/24/22 1215     PTT 69 sec             Rads:     Radiology Results (24 Hour)       ** No results found for the last 24 hours. **              Medications:     Current Facility-Administered Medications   Medication Dose Route Frequency    amiodarone  200 mg Oral Daily    atorvastatin  40 mg Oral Daily    b complex-vitamin c-folic acid  1 tablet Oral Daily    carvedilol  12.5 mg Oral Q12H [facility]    cinacalcet  30 mg Oral QPM    megestrol  400 mg Oral BID Meals    midodrine  5 mg Oral BID Meals    montelukast  10 mg Oral QHS    oxybutynin XL  5 mg Oral Daily    pantoprazole  40 mg Oral Daily    petrolatum   Topical Q12H SCH    sevelamer carbonate  800 mg Oral TID MEALS    sodium hypochlorite   Irrigation Q12H SCH    umeclidinium-vilanterol  1 puff Inhalation QAM      [Held by  provider] heparin infusion 25,000 units/500 mL (Cardiac/Low Intensity) Stopped (08/25/22 0459)     acetaminophen, albumin human, benzocaine-menthol, benzonatate, carboxymethylcellulose sodium, dextrose **OR** dextrose **OR** dextrose **OR** glucagon (rDNA), heparin (porcine), melatonin, morphine, naloxone, oxyCODONE, polyethylene glycol, saline, sodium chloride, sodium chloride, sodium chloride, sodium chloride     Physical Exam:     General appearance - alert, answers simple questions  Chest - no tachypnea, retractions or cyanosis  Heart - normal rate and regular rhythm  Abdomen - soft, grossly nontender  Wound - midline laparotomy incision with extension to RLQ with necrotic and fibrinous debris  Extremities - warm, well-perfused    Signed by: Lorenda Ishihara, NP          [1]   Patient Active Problem List  Diagnosis    Type 2 diabetes mellitus with kidney complication, with long-term current use of insulin    Diabetes mellitus type II, uncontrolled    Neuropathy of hand    Left renal mass    Hamstring injury    Peritonitis    ESRD (end stage renal disease)    Chronic diastolic congestive heart failure    Aortic atherosclerosis    Malignant neoplasm of urinary bladder, unspecified site    Malignant tumor of kidney, left    Benign essential hypertension    GERD (gastroesophageal reflux disease)    Leukocytosis    Bladder cancer    Anemia    COPD (chronic obstructive pulmonary disease)    Hemodialysis catheter dysfunction, initial encounter    CKD (chronic kidney disease) requiring chronic dialysis    Surgical wound  breakdown

## 2022-08-25 NOTE — Plan of Care (Signed)
Problem: Renal Instability  Goal: Fluid and electrolyte balance are achieved/maintained  Flowsheets (Taken 08/25/2022 0811)  Fluid and electrolyte balance are achieved/maintained:   Monitor/assess lab values and report abnormal values   Observe for cardiac arrhythmias   Monitor for muscle weakness   Assess and reassess fluid and electrolyte status   Follow fluid restrictions/IV/PO parameters

## 2022-08-25 NOTE — PT Progress Note (Signed)
Physical Therapy Note    Pt chart reviewed, pt to go to OR today for wound debridement. Pt will require new PT/OT orders following surgery to resume pt care.     Lona Millard, PT, DPT

## 2022-08-25 NOTE — Progress Notes (Signed)
[facility] PROGRESS NOTE  [id number]      Date Time: 08/25/22 9:33 AM  Patient Name: Shelby Barron  Attending Physician: Arneta Cliche, MD    CC: follow-up ESRD    Assessment:     1) Malfunctioning permcath - s/p replacement 08/23/22  2) [name] MWF  3) Anemia due to CKD  4) Deconditioning s/p prolonged hospitalization at [facility]   5) Abdominal wound in need of debridement  Recommendations:     1) Tolerating HD: Current Rx=  Dialysis Treatment Data:  Blood flow rate: 350 mL/min  Dialysate flow rate: 700 mL/min  Ultrafiltration rate: 1040 mL/hr    Dialysate potassium: 2 mEq/L  Dialysate calcium: 2.5 mEq/L    Arterial pressure: -171 mmHg  Venous pressure:     2) To OR today for debridement of abdominal wound      Case discussed with: patient, HD RN      Glenard Haring, MD  [facility]  [phone] (office)      Subjective:   Seen on HD.  Occ abdominal pain at wound site.  More lucid.      Physical Exam:     Vitals:    08/25/22 0845 08/25/22 0900 08/25/22 0915 08/25/22 0930   BP: 106/54 124/63 113/56 118/53   Pulse: 87 92 89 89   Resp: 19 19 20 18    Temp:       TempSrc:       SpO2: 98% 99% 98% 98%   Weight:       Height:           Intake and Output Summary (Last 24 hours) at Date Time  No intake or output data in the 24 hours ending 08/25/22 0933      General: awake, alert, no acute distress  Cardiovascular: regular rate and rhythm, no murmurs, rubs or gallops  Lungs: clear to auscultation anteriorly  Abdomen: soft, dressing in place  Extremities: no LE edema  RUE with ecchymoses    Meds:      Scheduled Meds: PRN Meds:    amiodarone, 200 mg, Oral, Daily  atorvastatin, 40 mg, Oral, Daily  b complex-vitamin c-folic acid, 1 tablet, Oral, Daily  carvedilol, 12.5 mg, Oral, Q12H [facility]  cinacalcet, 30 mg, Oral, QPM  megestrol, 400 mg, Oral, BID Meals  midodrine, 5 mg, Oral, BID Meals  montelukast, 10 mg, Oral, QHS  oxybutynin XL, 5 mg, Oral, Daily  pantoprazole, 40 mg, Oral,  Daily  petrolatum, , Topical, Q12H SCH  sevelamer carbonate, 800 mg, Oral, TID MEALS  sodium hypochlorite, , Irrigation, Q12H SCH  umeclidinium-vilanterol, 1 puff, Inhalation, QAM           acetaminophen, 650 mg, Q4H PRN  albumin human, 100 mL, PRN  benzocaine-menthol, 1 lozenge, Q2H PRN  benzonatate, 100 mg, TID PRN  carboxymethylcellulose sodium, 1 drop, TID PRN  dextrose, 15 g of glucose, PRN   Or  dextrose, 12.5 g, PRN   Or  dextrose, 12.5 g, PRN   Or  glucagon (rDNA), 1 mg, PRN  heparin (porcine), 3,000 Units, PRN  melatonin, 3 mg, QHS PRN  morphine, 2 mg, Q8H PRN  naloxone, 0.2 mg, PRN  oxyCODONE, 5 mg, Q6H PRN  polyethylene glycol, 17 g, Daily PRN  saline, 2 spray, Q4H PRN  sodium chloride, 100 mL, Q1H PRN  sodium chloride, 100 mL, Q1H PRN  sodium chloride, 250 mL, PRN  sodium chloride, 250 mL, PRN  Labs:     Recent Labs   Lab 08/25/22  0416 08/24/22  0354 08/23/22  0438   WBC 18.86* 16.20* 17.19*   Hemoglobin 8.2* 7.7* 8.3*   Hematocrit 25.6* 23.9* 25.8*   Platelet Count 470* 427* 433*     Recent Labs   Lab 08/25/22  0416 08/24/22  0355 08/23/22  0438   Sodium 142 144 140   Potassium 4.8 4.3 5.1   Chloride 104 106 100   CO2 22 24 22    BUN 46* 38* 67*   Creatinine 5.8* 4.9* 8.0*   Calcium 8.1 8.2 8.4   Albumin 1.4* 1.4* 1.5*   Phosphorus 3.9 3.6 5.1*   Magnesium 1.9 1.9 2.0   Glucose 124* 115* 73   GFR 6.7* 8.2* 4.6*             Invalid input(s): "LEUKOCYTESUR"        Imaging personally reviewed, including: No results found.        Signed by: Glenard Haring, MD

## 2022-08-25 NOTE — Progress Notes (Signed)
Called Daughter Alexzia Viswanathan for consent for surgery and anesthesia.  She does consent, another nurse, Boyd Kerbs, confirmed this as well.

## 2022-08-25 NOTE — Progress Notes (Signed)
[facility]  HOSPITALIST  PROGRESS NOTE      Patient: Shelby Barron  Date: 08/25/2022   LOS: 3 Days  Admission Date: 08/21/2022   MRN: [id number]  Attending:  Arneta Cliche, MD     ASSESSMENT/PLAN     [name] is a 82 y.o. female with PMH AF on Eliquis, chronic HFpEF, HTN, HLD, h/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 during complicated admission at [facility], [name], RCC s/p bilateral nephrectomy, urothelial CA s/p cystectomy c/b small bowel enterotomy s/p small bowel anastomosis and abdominal closure requiring wound vac, anemia of chronic disease, COPD, h/o T2DM (last A1c 5.6 in June 2024), dysphagia, gout, admitted with malfunctioning HD catheter with need for HD and non-healing abdominal surgical wound.    Non-healing abdominal surgical wound   Small bowel enterotomy s/p small bowel anastomosis, abdominal closure, wound vac  SIRS present on admission  - SIRS present on admission with persistent leukocytosis, tachycardia  - CXR without infectious etiology, patient anuric w/out obvious signs of infection  - Appreciate Surgery and WOCN consult and recommendations after malfunctioning wound vac removed  - In OR, large mass of necrotic fat overlying pus pocket incised and drained  - Despite lack of fever, given elevation in WBC and surgical findings, will start Vanco and ceftriaxone for potential skin/soft tissue infection  - Resume heparin gtt at 1800 this evening with plan for resuming Eliquis in AM  - Pain management: morphine 2mg  IV q8h prn, oxycodone 5mg  po q6h prn   - Maintain wound vac  - Monitor for fever    Malfunctioning HD Catheter  [name] MWF  H/o RCC s/p bilateral nephrectomy  Urothelial CA s/p cystectomy  - Appreciate IR and [facility] consult and recommendations  - s/p Permacath exchange in IR 08/23/22  - s/p HD on 08/23/22, continue MWF HD schedule  - Continue Nephrovite, cinacalcet, sevelamer  - Continue midodrine for hypotension (hold for SBP>120)    Anemia of chronic  kidney disease, stable  Iron deficiency anemia  Thrombocytosis, suspect reactive  - Hb stable at b/l  - Iron studies from 07/18/22 reviewed, not on supplementation  - Monitor daily CBC    COPD  - Continue montelukast, Anoro    Dysphagia  - Appreciate SLP consult and recommendations  - Continue PPI  - Aspiration precautions    AF on Eliquis  Chronic HFpEF  HTN  HLD  H/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  - Continue amiodarone 200mg  po daily  - Continue atorvastatin 40mg  qHS  - Resume Eliquis 2.5 mg po BID 08/26/22 in AM with concurrent discontinuation of heparin gtt    Deconditioned state s/p prolonged hospitalisation  - PT/OT recommend SNF  - Fall precautions, skin precautions    Severe Protein Calorie Malnutrition related to inadequate oral intake in the setting of acute illness as evidenced by 9% weight loss x 1 month,  moderate muscle wasting (temporalis, pectoralis, deltoid) and moderate fat loss (orbital, buccal, triceps)   - Appreciate Nutrition consult and recommendations  - Continue Megace BID    Obesity Class I, BMI 30  - Lifestyle modifications impractical for largely bed-bound patient    Analgesia: Tylenol prn     FEN  Fluids: SL  Nutrition: Minced and Moist, thin liquids. NPO after MN    Monitoring:  Telemetry needed: Yes  Continuous pulse oximetry needed: No  Level of Care: 3      Safety Checklist  DVT prophylaxis:  Chemical and / or mechanical ppx NOT indicated or contraindicated: Fully anticoagulated    Foley: Not present   IVs:  Peripheral IV   PT/OT: Not needed   Daily CBC & or Chem ordered: Yes, due to clinical and lab instability       Patient Lines/Drains/Airways Status       Active PICC Line / CVC Line / PIV Line / Drain / Airway / Intraosseous Line / Epidural Line / ART Line / Line / Wound / Pressure Ulcer / NG/OG Tube       Name Placement date Placement time Site Days    Permacath Catheter - Tunneled 07/14/22 Subclavian vein catheter Right 07/14/22  1404  -- 38    Peripheral IV 20 G  Standard Anterior;Right Wrist --  --  Wrist  --    Wound 06/30/22 Surgical Incision Abdomen Other (Comment) WOUND VAC applied 06/30/22  1152  Abdomen  52    Wound 07/23/22 Skin Tear Buttocks;Coccyx Inner R and L cheek, coccyx 07/23/22  --  Buttocks;Coccyx  30                       Code Status: full code    DISPO: assisted living facility    Family Contact: As listed in medical chart    Care Plan discussed with nursing, consultants       SUBJECTIVE     Shelby Barron is largely non-verbal though tries to speak at times. Appears uncomfortable in pain from surgical incision site.  MEDICATIONS     Current Facility-Administered Medications   Medication Dose Route Frequency    amiodarone  200 mg Oral Daily    atorvastatin  40 mg Oral Daily    b complex-vitamin c-folic acid  1 tablet Oral Daily    carvedilol  12.5 mg Oral Q12H [facility]    cinacalcet  30 mg Oral QPM    megestrol  400 mg Oral BID Meals    midodrine  5 mg Oral BID Meals    montelukast  10 mg Oral QHS    oxybutynin XL  5 mg Oral Daily    pantoprazole  40 mg Oral Daily    petrolatum   Topical Q12H [facility]    sevelamer carbonate  800 mg Oral TID MEALS    sodium hypochlorite   Irrigation Q12H SCH    umeclidinium-vilanterol  1 puff Inhalation QAM       ROS     All other systems not mentioned in subjective reviewed and negative - unable to obtain    PHYSICAL EXAM     Vitals:    08/25/22 1330   BP: 135/64   Pulse: 89   Resp: 21   Temp:    SpO2: 99%       Temperature: Temp  Min: 97.2 F (36.2 C)  Max: 99.4 F (37.4 C)  Pulse: Pulse  Min: 80  Max: 95  Respiratory: Resp  Min: 12  Max: 25  Non-Invasive BP: BP  Min: 94/56  Max: 139/59  Pulse Oximetry SpO2  Min: 93 %  Max: 100 %    Intake and Output Summary (Last 24 hours) at Date Time    Intake/Output Summary (Last 24 hours) at 08/25/2022 1412  Last data filed at 08/25/2022 1300  Gross per 24 hour   Intake 100 ml   Output 1700 ml   Net -1600 ml       GEN APPEARANCE: Chronically ill, frail appearing  HEENT:  Pupils round, equal,  reactive. Anicteric sclera. Dry MM  NECK: supple  CVS: RRR, S1, S2. 2/6 systolic murmur  LUNGS: CTAB  ABD: +BS, soft NTND  EXT: Flaccid LUE paralysis with left hand mild contracture  SKIN: Pale. Large non-healing abdominal surgical wound/incision with wound vac in place post-operatively  NEURO: Flat affect. Minimally verbal    LABS     Recent Labs   Lab 08/25/22  0416 08/24/22  0354 08/23/22  0438   WBC 18.86* 16.20* 17.19*   RBC 2.77* 2.63* 2.80*   Hemoglobin 8.2* 7.7* 8.3*   Hematocrit 25.6* 23.9* 25.8*   MCV 92.4 90.9 92.1   Platelet Count 470* 427* 433*       Recent Labs   Lab 08/25/22  0416 08/24/22  0355 08/23/22  0438 08/22/22  0333 08/21/22  1424   Sodium 142 144 140 141 139   Potassium 4.8 4.3 5.1 4.5 4.8   Chloride 104 106 100 98* 100   CO2 22 24 22 23 23    BUN 46* 38* 67* 61* 52*   Creatinine 5.8* 4.9* 8.0* 6.9* 6.6*   Glucose 124* 115* 73 60* 76   Calcium 8.1 8.2 8.4 9.0 9.1   Magnesium 1.9 1.9 2.0 2.1 2.0       Recent Labs   Lab 08/25/22  0416 08/24/22  0355 08/23/22  0438 08/21/22  1424   ALT  --   --   --  13   AST (SGOT)  --   --   --  13   Bilirubin, Total  --   --   --  0.3   Albumin 1.4* 1.4* 1.5* 1.6*   Alkaline Phosphatase  --   --   --  123*       Recent Labs   Lab 08/25/22  0416 08/24/22  2015 08/24/22  1151 08/24/22  0354 08/23/22  1637   INR  --   --   --   --  1.7   PT  --   --   --   --  19.8*   PTT 46* 50* 69*  More results in Results Review 30   More results in Results Review = values in this interval not displayed.       Microbiology Results (last 15 days)       Procedure Component Value Units Date/Time    Culture, Blood, Aerobic And Anaerobic [[id number]] Collected: 08/11/22 0208    Order Status: Completed Specimen: Blood, Venous Updated: 08/16/22 0700     Culture Blood No growth at 5 days    Culture, Blood, Aerobic And Anaerobic [[id number]] Collected: 08/11/22 0208    Order Status: Completed Specimen: Blood, Venous Updated: 08/16/22 0700     Culture Blood No growth at 5 days              RADIOLOGY     Radiological Procedure personally reviewed and concur with radiologist reports unless stated otherwise.    Tunneled Cath Check/Change (Permcath)   Final Result      Right-sided PermCath exchange with new 14.5 French 19 cm PermCath with tip   in the right atrium.      PLAN:    Catheter ready for immediate use.      Darci Current, MD   08/23/2022 12:49 PM      XR Chest  AP Portable   Final Result      No significant change mild pulmonary vascular congestion and mild basal   atelectasis.  Hiatal hernia.      Clide Cliff, MD   08/21/2022 2:35 PM          Signed,  Kathrene Bongo, FNP-BC, AGACNP-BC  2:12 PM 08/25/2022

## 2022-08-25 NOTE — Consults (Signed)
Department of Pharmacy - Vancomycin Dosing Progress Note    Patient: Shelby Barron   Room/Bed: M578/I696-29    Assessment   Vancomycin indication: SSTI  Patient weight: 81.9 kg (180 lb 8.9 oz)   Age: 82 y.o.    Renal Function:   Serum creatinine: 5.8 mg/dL (H) 52/84/13 2440  Estimated creatinine clearance: 7.8 mL/min (A)      Vancomycin Regimen: Day  Doses received: none        Pharmacy Plan     Vancomycin 1000 mg IV x once. Random vanc level on 8/8 at 1300.    Pharmacy will continue to follow, and order levels and/or adjust vancomycin doses as necessary.  Please contact pharmacy with any questions at  x [phone], or Epic Secure Chat group "[facility]."    Chrystine Oiler, RPh

## 2022-08-25 NOTE — Progress Notes (Signed)
HD started per order. Time out for the pt, consent and order done prior to the HD. Pt is alert and disoriented on occasions. Procedure explained. Right perm catheter is patent. Labs reviewed. Report received from the primary care RN.   08/25/22 0803   Vitals   Temp 98.6 F (37 C)   Heart Rate 92   Resp Rate 20   BP 139/59   SpO2 96 %   O2 Device None (Room air)   Machine Metrics   $Treatment Started/Capturing Charge Yes   Blood Flow Rate (mL/min) 350 mL/min   Arterial Pressure (mmHg) -137 mmHg   Venous Pressure (mmHg) 132   Dialysate Flow Rate (mL/min) 700 mL/min   Transmembrane Pressure (mmHg) 43 mmHg   Ultrafiltration Rate (mL/Hr) 690 mL/hr   Fluid Removal (ml) 0 ml   Fluid Bolus (ml) 200 ml   Dialysate K (mEq/L) 2 mEq/L   Dialysate CA (mEq/L) 2.5 mEq/L   Hemodialysis Comments   Arteriovenous Lines Secure Yes   Comments HD started per order

## 2022-08-25 NOTE — Anesthesia Postprocedure Evaluation (Signed)
Anesthesia Post Evaluation    Patient: Shelby Barron    Procedure(s):  DEBRIDEMENT & IRRIGATION, WOUND  WOUND VAC APPLICATION    Anesthesia type: general    Last Vitals:   Vitals Value Taken Time   BP 136/63 08/25/22 1320   Temp 36.9 C (98.5 F) 08/25/22 1320   Pulse 89 08/25/22 1320   Resp 19 08/25/22 1320   SpO2 98 % 08/25/22 1320                 Anesthesia Post Evaluation:     Patient Evaluated: PACU  Patient Participation: complete - patient participated  Level of Consciousness: awake  Pain Score: 2  Pain Management: adequate  Non-opioid multimodal analgesia not used between 6 hours prior to anesthesia start and PACU discharge    Airway Patency: patent        Anesthetic complications: No      PONV Status: none    Cardiovascular status: acceptable  Respiratory status: acceptable  Hydration status: acceptable      Signed by: Donald Siva, MD, 08/25/2022 1:35 PM

## 2022-08-25 NOTE — Progress Notes (Signed)
Patient is awake, follow some commands. Disoriented to time. VSS, denies pain when asked, HOB elevated. Mepilex on her bilateral heel and sacrum are intact. NSR.    Wound vac is functioning connected to her abdominal wound set at 125 mm hg at low continuous pressure.    Patient is returning to her room, daughter updated and report was given to [employer]. Patient is waiting for transport .

## 2022-08-25 NOTE — Anesthesia Preprocedure Evaluation (Addendum)
Anesthesia Evaluation    AIRWAY    Mallampati: II    TM distance: >3 FB  Neck ROM: full  Mouth Opening:full  Planned to use difficult airway equipment: No CARDIOVASCULAR    cardiovascular exam normal       DENTAL                   PULMONARY    pulmonary exam normal     OTHER FINDINGS                                      Relevant Problems   PULMONARY   (+) COPD (chronic obstructive pulmonary disease)      CARDIO   (+) Aortic atherosclerosis   (+) Benign essential hypertension   (+) Chronic diastolic congestive heart failure      GI   (+) GERD (gastroesophageal reflux disease)      GU/RENAL   (+) CKD (chronic kidney disease) requiring chronic dialysis   (+) ESRD (end stage renal disease)   (+) Malignant tumor of kidney, left      ENDO   (+) Type 2 diabetes mellitus with kidney complication, with long-term current use of insulin               Anesthesia Plan    ASA 4     general               (Risk and benefits of anesthesia explained to patient. Questions answered, consent obtained.  )      intravenous induction   Detailed anesthesia plan: general IV, general LMA and general endotracheal        Post op pain management: per surgeon    informed consent obtained      pertinent labs reviewed           Signed by: Donald Siva, MD 08/25/22 11:43 AM

## 2022-08-25 NOTE — Op Note (Addendum)
FULL OPERATIVE NOTE    Date Time: 08/25/22 12:43 PM  Patient Name: [name], Shelby Barron  Surgeon: Vanessa Ralphs. Zollie Beckers, MD      Date of Operation:   08/25/2022    Providers Performing:   Surgeon(s):  Berle Mull, MD    Circulator: Satira Sark, RN  Relief Scrub: Kathaleen Bury [place] Dom Person: Lenard Forth, RN; Bonney Roussel, RN  First Assistant: Paulita Fujita  Team Leader: Lenard Forth, RN    The assistant listed provided continuous assistance throughout the case and was an integral part of the procedure.  The assistant listed provided continuous assistance with the surgery and was an integral part of the procedure.  Operative Procedure:   Procedure(s):  DEBRIDEMENT & IRRIGATION, WOUND    Preoperative Diagnosis:   Pre-Op Diagnosis Codes:      * Surgical wound breakdown [T81.31XA]    Postoperative Diagnosis:   Post-Op Diagnosis Codes:     * Surgical wound breakdown [T81.31XA]    Indications:   same    Operative Notes:   After consent obtained, the patient was brought to the OR.  SCDs were placed and the patient was anesthetized.  Surgical pause was performed. The patient was prepped and draped in sterile fashion.  Ancef 1 g given.  The wound was examined.  On the left edge, ad 1 by 4 by 3 cm area of necrotic vat was sharply removed.  Otherwise this side of the wound, along with the entire midline portion, appeared viable and granulating well.  On the R side, the lateral area of wound extension had a large mass of necrotic fat overlying a pocket of pus.  A 10 by 6 by 6 cm area was sharply excised to bleeding tissue.  Two 4 cm tunnels were identified laterally which were surrounded by healthy tissue.  These were cleaned out.  All remaining tissue appeared viable.  Cautery was applied where needed.  Black sponge was used to fill the wound and both tunnels and the vac applied.   The patient was awakened and sent to PACU in stable condition with all counts correct.  Tissue removed was skin and  subcutaneous.      Estimated Blood Loss:   * No values recorded between 08/25/2022 12:08 PM and 08/25/2022 12:43 PM *    Implants:   * No implants in log *    Drains:   Drains:  VAC    Specimens:   * No specimens in log *      Findings:   same as postop diagnosis  Infection was present at the time of surgery at the superficial level. I visualized purulence/pus.  Complications:   None    Signed by: Berle Mull, MD, MD

## 2022-08-25 NOTE — Progress Notes (Signed)
HD completed with no complications. Pt tolerated the TX. HD cut to 2.5 hrs due to pt needs to go to the surgery. 1.7 liter fluid removed per order. 200 ml albumin given during the HD to maintain the BP. Pt is alert and oriented. The right perm catheter is patent. The VS is WDL. Report given to the primary care RN.   08/25/22 1033   Vitals   Temp 98.2 F (36.8 C)   Heart Rate 93   Resp Rate 21   BP 128/49   SpO2 97 %   O2 Device None (Room air)   Machine Metrics   Blood Flow Rate (mL/min) 200 mL/min   Arterial Pressure (mmHg) -23 mmHg   Venous Pressure (mmHg) 20   Dialysate Flow Rate (mL/min) 700 mL/min   Transmembrane Pressure (mmHg) 80 mmHg   Ultrafiltration Rate (mL/Hr) 0 mL/hr   Fluid Removal (ml) 2350 ml   Fluid Bolus (ml) 200 ml   Dialysate K (mEq/L) 2 mEq/L   Dialysate CA (mEq/L) 2.5 mEq/L   Hemodialysis Comments   Arteriovenous Lines Secure Yes   Comments HD completed with no complications

## 2022-08-25 NOTE — Nursing Progress Note (Addendum)
Admission Note-  Patient admitted from CDU to Medical#  [id number] at 1715. Hand off report received from [place], [place].    Patient oriented to room, bed in lowest position, bed alarm activated, call bell within reach.   Fall prevention contract completed. Yes/No  Patient informed of visitor policy. Caregiver at bedside.  Belongings sent with pt..     Brief Clinical Picture:  Neuro AXO1; Resp - RA; NO Foley or Central lines; GI- WNL.     Skin Assessment  Two nurse skin assessment performed with: Kem Kays, RN.    Areas observed with redness or injury: pt has large abd wound, sp I/d today, wound vac in place, no leaks, draining small amount of ss fluid.  Skin tears on bil buttocks, Mepilex in place, changed.  Scattered bruises; cracked feet; blanchable redness bil heels. Prevalon boots on.    Devices present: wound vac   skin integrity under device: WNL    Braden score <15. Yes    Actions taken: Q2H turns using pillows and TAPS.     Any significant admission event: none.

## 2022-08-26 ENCOUNTER — Inpatient Hospital Stay: Payer: Medicare Other | Admitting: Acute Care

## 2022-08-26 ENCOUNTER — Encounter: Payer: Self-pay | Admitting: Surgery

## 2022-08-26 ENCOUNTER — Inpatient Hospital Stay: Payer: Medicare Other

## 2022-08-26 LAB — RENAL FUNCTION PANEL
Albumin: 2.2 g/dL — ABNORMAL LOW (ref 3.5–5.0)
Anion Gap: 13 (ref 5.0–15.0)
BUN: 28 mg/dL — ABNORMAL HIGH (ref 7–21)
CO2: 23 mEq/L (ref 17–29)
Calcium: 8.6 mg/dL (ref 7.9–10.2)
Chloride: 106 mEq/L (ref 99–111)
Creatinine: 4.5 mg/dL — ABNORMAL HIGH (ref 0.4–1.0)
GFR: 9.1 mL/min/{1.73_m2} — ABNORMAL LOW (ref >=60.0)
Glucose: 117 mg/dL — ABNORMAL HIGH (ref 70–100)
Phosphorus: 2.9 mg/dL (ref 2.3–4.7)
Potassium: 4.4 mEq/L (ref 3.5–5.3)
Sodium: 142 mEq/L (ref 135–145)

## 2022-08-26 LAB — CBC
Absolute nRBC: 0.02 10*3/uL — ABNORMAL HIGH (ref <=0.00)
Hematocrit: 22.3 % — ABNORMAL LOW (ref 34.7–43.7)
Hemoglobin: 7 g/dL — ABNORMAL LOW (ref 11.4–14.8)
MCH: 29.5 pg (ref 25.1–33.5)
MCHC: 31.4 g/dL — ABNORMAL LOW (ref 31.5–35.8)
MCV: 94.1 fL (ref 78.0–96.0)
MPV: 9 fL (ref 8.9–12.5)
Platelet Count: 398 10*3/uL — ABNORMAL HIGH (ref 142–346)
RBC: 2.37 10*6/uL — ABNORMAL LOW (ref 3.90–5.10)
RDW: 16 % — ABNORMAL HIGH (ref 11–15)
WBC: 17.37 10*3/uL — ABNORMAL HIGH (ref 3.10–9.50)
nRBC %: 0.1 /100 WBC — ABNORMAL HIGH (ref <=0.0)

## 2022-08-26 LAB — IRON PROFILE
Iron: 23 ug/dL — ABNORMAL LOW (ref 32–157)
UIBC: <25 ug/dL — ABNORMAL LOW (ref 126–382)

## 2022-08-26 LAB — BASIC METABOLIC PANEL
Anion Gap: 18 — ABNORMAL HIGH (ref 5.0–15.0)
BUN: 33 mg/dL — ABNORMAL HIGH (ref 7–21)
CO2: 21 mEq/L (ref 17–29)
Calcium: 9.2 mg/dL (ref 7.9–10.2)
Chloride: 104 mEq/L (ref 99–111)
Creatinine: 5.2 mg/dL — ABNORMAL HIGH (ref 0.4–1.0)
GFR: 7.6 mL/min/{1.73_m2} — ABNORMAL LOW (ref >=60.0)
Glucose: 150 mg/dL — ABNORMAL HIGH (ref 70–100)
Potassium: 4.4 mEq/L (ref 3.5–5.3)
Sodium: 143 mEq/L (ref 135–145)

## 2022-08-26 LAB — MAGNESIUM: Magnesium: 1.9 mg/dL (ref 1.6–2.6)

## 2022-08-26 LAB — APTT
PTT: 72 s — ABNORMAL HIGH (ref 27–39)
PTT: 33 s (ref 27–39)

## 2022-08-26 LAB — VANCOMYCIN, RANDOM: Vancomycin Random: 18.9 ug/mL

## 2022-08-26 LAB — WHOLE BLOOD GLUCOSE POCT: Whole Blood Glucose POCT: 149 mg/dL — ABNORMAL HIGH (ref 70–100)

## 2022-08-26 LAB — HEMOGLOBIN AND HEMATOCRIT
Hematocrit: 29.2 % — ABNORMAL LOW (ref 34.7–43.7)
Hemoglobin: 9.5 g/dL — ABNORMAL LOW (ref 11.4–14.8)

## 2022-08-26 MED ORDER — MORPHINE SULFATE 2 MG/ML IJ/IV SOLN (WRAP)
2.0000 mg | Status: DC | PRN
Start: 2022-08-26 — End: 2022-09-03
  Administered 2022-08-26 – 2022-09-02 (×8): 2 mg via INTRAVENOUS
  Filled 2022-08-26 (×8): qty 1

## 2022-08-26 MED ORDER — SODIUM CHLORIDE 0.9 % IV SOLN
INTRAVENOUS | Status: DC | PRN
Start: 2022-08-26 — End: 2022-09-03

## 2022-08-26 MED ORDER — ONDANSETRON HCL 4 MG/2ML IJ SOLN
4.0000 mg | Freq: Three times a day (TID) | INTRAMUSCULAR | Status: DC | PRN
Start: 2022-08-26 — End: 2022-09-03
  Administered 2022-08-26: 4 mg via INTRAVENOUS
  Filled 2022-08-26: qty 2

## 2022-08-26 MED ORDER — HEPARIN (PORCINE) IN D5W 50-5 UNIT/ML-% IV SOLN (UNITS/KG/HR ONLY)
12.0000 [IU]/kg/h | INTRAVENOUS | Status: DC
Start: 2022-08-26 — End: 2022-09-01
  Administered 2022-08-26 – 2022-08-29 (×4): 12 [IU]/kg/h via INTRAVENOUS
  Administered 2022-08-30 – 2022-08-31 (×2): 10 [IU]/kg/h via INTRAVENOUS
  Filled 2022-08-26 (×6): qty 500

## 2022-08-26 MED ORDER — HEPARIN SODIUM (PORCINE) 5000 UNIT/ML IJ SOLN
3000.0000 [IU] | INTRAMUSCULAR | Status: DC | PRN
Start: 2022-08-26 — End: 2022-09-01

## 2022-08-26 MED ORDER — HEPARIN (PORCINE) IN D5W 50-5 UNIT/ML-% IV SOLN (UNITS/KG/HR ONLY)
12.0000 [IU]/kg/h | INTRAVENOUS | Status: DC
Start: 2022-08-26 — End: 2022-08-26

## 2022-08-26 MED ORDER — DARBEPOETIN ALFA 40 MCG/0.4ML IJ SOSY
0.4500 ug/kg | PREFILLED_SYRINGE | INTRAMUSCULAR | Status: DC
Start: 2022-08-26 — End: 2022-09-03
  Administered 2022-08-26 – 2022-09-02 (×2): 40 ug via SUBCUTANEOUS
  Filled 2022-08-26 (×2): qty 0.4

## 2022-08-26 MED ORDER — ALBUTEROL-IPRATROPIUM 2.5-0.5 (3) MG/3ML IN SOLN
3.0000 mL | Freq: Once | RESPIRATORY_TRACT | Status: AC
Start: 2022-08-26 — End: 2022-08-26
  Administered 2022-08-26: 3 mL via RESPIRATORY_TRACT
  Filled 2022-08-26: qty 3

## 2022-08-26 NOTE — Progress Notes (Signed)
[facility]  HOSPITALIST  PROGRESS NOTE      Patient: Shelby Barron  Date: 08/26/2022   LOS: 4 Days  Admission Date: 08/21/2022   MRN: [id number]  Attending:  Arneta Cliche, MD     ASSESSMENT/PLAN     Shelby Barron is a 82 y.o. female with PMH AF on Eliquis, chronic HFpEF, HTN, HLD, h/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 during complicated admission at [facility], [name], RCC s/p bilateral nephrectomy, urothelial CA s/p cystectomy c/b small bowel enterotomy s/p small bowel anastomosis and abdominal closure requiring wound vac, anemia of chronic disease, COPD, h/o T2DM (last A1c 5.6 in June 2024), dysphagia, gout, admitted with malfunctioning HD catheter with need for HD and non-healing abdominal surgical wound.    Non-healing abdominal surgical wound s/p I&D 08/25/22 with Dr. Morton Stall bowel enterotomy s/p small bowel anastomosis, abdominal closure, wound vac  SIRS present on admission  - SIRS present on admission with persistent leukocytosis, tachycardia  - CXR without infectious etiology, patient anuric w/out obvious signs of infection  - Appreciate Surgery and WOCN consult and recommendations after malfunctioning wound vac removed  - s/p I&D 08/25/22 with Dr. Zollie Beckers  - In OR, large mass of necrotic fat overlying pus pocket incised and drained  - Despite lack of fever, given elevation in WBC and surgical findings, continue Vanco and ceftriaxone for potential skin/soft tissue infection (would give minimum 5 days)  - Resume heparin gtt, may transition to Eliquis closer to discharge  - Pain management: morphine 2mg  IV q8h prn, oxycodone 5mg  po q6h prn   - Maintain wound vac  - Monitor for fever    Acute blood loss anemia 2/to abdominal incision and drainage  Anemia of chronic kidney disease, stable  Iron deficiency anemia  Thrombocytosis, suspect reactive  - Hb dropped to 7, was given 1u PRBC overnight 08/26/22 with good response  - Iron studies from 07/18/22 reviewed, not on supplementation  - Monitor daily  CBC    Malfunctioning HD Catheter  [name] MWF  H/o RCC s/p bilateral nephrectomy  Urothelial CA s/p cystectomy  - Appreciate IR and [facility] consult and recommendations  - s/p Permacath exchange in IR 08/23/22  - s/p HD on 08/23/22, continue MWF HD schedule  - Continue Nephrovite, cinacalcet, sevelamer  - Continue midodrine for hypotension (hold for SBP>120)    COPD  - Continue montelukast, Anoro    Dysphagia  - Appreciate SLP consult and recommendations  - Continue PPI  - Aspiration precautions    AF on Eliquis  Chronic HFpEF  HTN  HLD  H/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  - Continue amiodarone 200mg  po daily  - Continue atorvastatin 40mg  qHS  - Resume Eliquis 2.5 mg po BID 08/26/22 in AM with concurrent discontinuation of heparin gtt    Deconditioned state s/p prolonged hospitalisation  - PT/OT recommend SNF  - Fall precautions, skin precautions    Severe Protein Calorie Malnutrition related to inadequate oral intake in the setting of acute illness as evidenced by 9% weight loss x 1 month,  moderate muscle wasting (temporalis, pectoralis, deltoid) and moderate fat loss (orbital, buccal, triceps)   - Appreciate Nutrition consult and recommendations  - Continue Megace BID    Obesity Class I, BMI 30  - Lifestyle modifications impractical for largely bed-bound patient    Analgesia: Tylenol prn     FEN  Fluids: SL  Nutrition: Minced and Moist, thin liquids.  Monitoring:  Telemetry needed: No  Continuous pulse oximetry needed: No  Level of Care: 4    Safety Checklist  DVT prophylaxis: Chemical and / or mechanical ppx NOT indicated or contraindicated: Fully anticoagulated    Foley: Not present   IVs:  Peripheral IV   PT/OT: Not needed   Daily CBC & or Chem ordered: Yes, due to clinical and lab instability       Patient Lines/Drains/Airways Status       Active PICC Line / CVC Line / PIV Line / Drain / Airway / Intraosseous Line / Epidural Line / ART Line / Line / Wound / Pressure Ulcer  / NG/OG Tube       Name Placement date Placement time Site Days    Permacath Catheter - Tunneled 07/14/22 Subclavian vein catheter Right 07/14/22  1404  -- 38    Peripheral IV 20 G Standard Anterior;Right Wrist --  --  Wrist  --    Wound 06/30/22 Surgical Incision Abdomen Other (Comment) WOUND VAC applied 06/30/22  1152  Abdomen  52    Wound 07/23/22 Skin Tear Buttocks;Coccyx Inner R and L cheek, coccyx 07/23/22  --  Buttocks;Coccyx  30                       Code Status: full code    DISPO: assisted living facility    Family Contact: As listed in medical chart    Care Plan discussed with nursing, consultants       SUBJECTIVE     Shelby Barron is largely non-verbal though tries to speak at times. Appears uncomfortable in pain from surgical incision site.  MEDICATIONS     Current Facility-Administered Medications   Medication Dose Route Frequency    amiodarone  200 mg Oral Daily    atorvastatin  40 mg Oral Daily    b complex-vitamin c-folic acid  1 tablet Oral Daily    carvedilol  12.5 mg Oral Q12H SCH    cefTRIAXone  1 g Intravenous Q24H    cinacalcet  30 mg Oral QPM    darbepoetin alfa  0.45 mcg/kg Subcutaneous Weekly    megestrol  400 mg Oral BID Meals    midodrine  5 mg Oral BID Meals    montelukast  10 mg Oral QHS    oxybutynin XL  5 mg Oral Daily    pantoprazole  40 mg Oral Daily    petrolatum   Topical Q12H SCH    sevelamer carbonate  800 mg Oral TID MEALS    umeclidinium-vilanterol  1 puff Inhalation QAM    vancomycin   Intravenous See Admin Instructions       ROS     All other systems not mentioned in subjective reviewed and negative - unable to obtain    PHYSICAL EXAM     Vitals:    08/26/22 1116   BP: 105/61   Pulse: 86   Resp:    Temp: 99 F (37.2 C)   SpO2:        Temperature: Temp  Min: 97.9 F (36.6 C)  Max: 99 F (37.2 C)  Pulse: Pulse  Min: 75  Max: 93  Respiratory: Resp  Min: 19  Max: 37  Non-Invasive BP: BP  Min: 101/53  Max: 140/65  Pulse Oximetry SpO2  Min: 92 %  Max: 100 %    Intake and  Output Summary (Last 24 hours) at Date Time    Intake/Output Summary (  Last 24 hours) at 08/26/2022 1515  Last data filed at 08/26/2022 1019  Gross per 24 hour   Intake 326 ml   Output 50 ml   Net 276 ml       GEN APPEARANCE: Chronically ill, frail appearing  HEENT: Pupils round, equal, reactive. Anicteric sclera. Dry MM  NECK: supple  CVS: RRR, S1, S2. 2/6 systolic murmur  LUNGS: CTAB  ABD: +BS, soft NTND. Wound VAC on covered abdominal surgical incision with small amount SS drainage  EXT: Flaccid LUE paralysis with left hand mild contracture  SKIN: Pale. Large non-healing abdominal surgical wound/incision with wound vac in place post-operatively  NEURO: Flat affect. Minimally verbal    LABS     Recent Labs   Lab 08/26/22  0407 08/25/22  0416 08/24/22  0354   WBC 17.37* 18.86* 16.20*   RBC 2.37* 2.77* 2.63*   Hemoglobin 7.0* 8.2* 7.7*   Hematocrit 22.3* 25.6* 23.9*   MCV 94.1 92.4 90.9   Platelet Count 398* 470* 427*       Recent Labs   Lab 08/26/22  0407 08/25/22  0416 08/24/22  0355 08/23/22  0438 08/22/22  0333   Sodium 142 142 144 140 141   Potassium 4.4 4.8 4.3 5.1 4.5   Chloride 106 104 106 100 98*   CO2 23 22 24 22 23    BUN 28* 46* 38* 67* 61*   Creatinine 4.5* 5.8* 4.9* 8.0* 6.9*   Glucose 117* 124* 115* 73 60*   Calcium 8.6 8.1 8.2 8.4 9.0   Magnesium 1.9 1.9 1.9 2.0 2.1       Recent Labs   Lab 08/26/22  0407 08/25/22  0416 08/24/22  0355 08/23/22  0438 08/21/22  1424   ALT  --   --   --   --  13   AST (SGOT)  --   --   --   --  13   Bilirubin, Total  --   --   --   --  0.3   Albumin 2.2* 1.4* 1.4*  More results in Results Review 1.6*   Alkaline Phosphatase  --   --   --   --  123*   More results in Results Review = values in this interval not displayed.       Recent Labs   Lab 08/26/22  0407 08/25/22  0416 08/24/22  2015 08/24/22  0354 08/23/22  1637   INR  --   --   --   --  1.7   PT  --   --   --   --  19.8*   PTT 72* 46* 50*  More results in Results Review 30   More results in Results Review = values in  this interval not displayed.     RADIOLOGY     Radiological Procedure personally reviewed and concur with radiologist reports unless stated otherwise.    Tunneled Cath Check/Change (Permcath)   Final Result      Right-sided PermCath exchange with new 14.5 French 19 cm PermCath with tip   in the right atrium.      PLAN:    Catheter ready for immediate use.      Darci Current, MD   08/23/2022 12:49 PM      XR Chest  AP Portable   Final Result      No significant change mild pulmonary vascular congestion and mild basal   atelectasis. Hiatal hernia.  Clide Cliff, MD   08/21/2022 2:35 PM          Signed,  Kathrene Bongo, FNP-BC, AGACNP-BC  3:15 PM 08/26/2022

## 2022-08-26 NOTE — Significant Event (Addendum)
Hemoglobin dropped to 7, patient is on heparin drip, she had about 50 mL of bloody output from the drain from her abdominal surgery. I expect further drop in her HCT  Patient was seen and examined, she has dementia and unable to consent for herself  Called daughter to get a consent for blood transfusion.  Said that her mom had blood transfusions x 4 in [place].  She consented for blood transfusion

## 2022-08-26 NOTE — Progress Notes (Signed)
No indication for sux at this time

## 2022-08-26 NOTE — Plan of Care (Addendum)
Pt bed bound, repositioned Q2H utilizing L-R turn clock, pillows and TAPS.  Abd pain managed with PO and IV regimen with adequate relief.  Pt confused, A&Ox1, communicates very little. Poor appetite, encouraged PO. Heparin drip stopped per orders to be restarted tonight.  This evening noted pt congested with phlegm that pt was unable to clear. Was able to suction small amount. Hospitalist PA notified and orders received for RT, CXR and labs. Will continue to monitor.  Wound vac connected, no leaks, draining bloody fluid.    Problem: Compromised Sensory Perception  Goal: Sensory Perception Interventions  Outcome: Progressing  Flowsheets (Taken 08/26/2022 0842)  Sensory Perception Interventions: Offload heels, Pad bony prominences, Reposition q 2hrs/turn Clock, Q2 hour skin assessment under devices if present     Problem: Compromised Moisture  Goal: Moisture level Interventions  Outcome: Progressing  Flowsheets (Taken 08/26/2022 0842)  Moisture level Interventions: Moisture wicking products, Moisture barrier cream     Problem: Compromised Activity/Mobility  Goal: Activity/Mobility Interventions  Outcome: Progressing  Flowsheets (Taken 08/26/2022 0842)  Activity/Mobility Interventions: Pad bony prominences, TAP Seated positioning system when OOB, Promote PMP, Reposition q 2 hrs / turn clock, Offload heels     Problem: Compromised Nutrition  Goal: Nutrition Interventions  Outcome: Progressing  Flowsheets (Taken 08/26/2022 0842)  Nutrition Interventions: Discuss nutrition at Rounds, I&Os, Document % meal eaten, Daily weights     Problem: Compromised Friction/Shear  Goal: Friction and Shear Interventions  Outcome: Progressing  Flowsheets (Taken 08/26/2022 0842)  Friction and Shear Interventions: Pad bony prominences, Off load heels, HOB 30 degrees or less unless contraindicated, Consider: TAP seated positioning, Heel foams     Problem: Renal Instability  Goal: Nutritional intake is adequate  Outcome: Progressing  Flowsheets (Taken  08/21/2022 1832 by Ardine Eng, Ros-Gassandre A, RN)  Nutritional intake is adequate:   Consult/collaborate with Clinical Nutritionist   Consult/collaborate with Speech Therapy (swallow evaluations)     Problem: Patient Receiving Advanced Renal Therapies  Goal: Therapy access site remains intact  Outcome: Progressing  Flowsheets (Taken 08/21/2022 2306 by Egbert Garibaldi, RN)  Therapy access site remains intact:   Change therapy access site dressing as needed   Assess therapy access site     Problem: Compromised skin integrity  Goal: Skin integrity is maintained or improved  Outcome: Progressing  Flowsheets (Taken 08/23/2022 2300 by Drinda Butts, RN)  Skin integrity is maintained or improved:   Assess Braden Scale every shift   Turn or reposition patient every 2 hours or as needed unless able to reposition self   Increase activity as tolerated/progressive mobility   Relieve pressure to bony prominences   Avoid shearing   Keep skin clean and dry   Collaborate with Wound, Ostomy, and Continence Nurse   Keep head of bed 30 degrees or less (unless contraindicated)     Problem: Nutrition  Goal: Nutritional intake is adequate  Outcome: Progressing  Flowsheets (Taken 08/21/2022 1832 by Ardine Eng, Ros-Gassandre A, RN)  Nutritional intake is adequate:   Consult/collaborate with Clinical Nutritionist   Consult/collaborate with Speech Therapy (swallow evaluations)     Problem: Pain interferes with ability to perform ADL  Goal: Pain at adequate level as identified by patient  Outcome: Progressing  Flowsheets (Taken 08/23/2022 2300 by Drinda Butts, RN)  Pain at adequate level as identified by patient:   Identify patient comfort function goal   Assess for risk of opioid induced respiratory depression, including snoring/sleep apnea. Alert healthcare team of risk factors identified.   Reassess pain within 30-60  minutes of any procedure/intervention, per Pain Assessment, Intervention, Reassessment (AIR) Cycle   Evaluate if patient comfort  function goal is met   Assess pain on admission, during daily assessment and/or before any "as needed" intervention(s)   Evaluate patient's satisfaction with pain management progress   Offer non-pharmacological pain management interventions   Include patient/patient care companion in decisions related to pain management as needed     Problem: Discharge Barriers  Goal: Patient will be discharged home or other facility with appropriate resources  Outcome: Progressing  Flowsheets (Taken 08/26/2022 1515)  Discharge to home or other facility with appropriate resources:   Provide appropriate patient education   Provide information on available health resources   Initiate discharge planning

## 2022-08-26 NOTE — Progress Notes (Signed)
Wound Ostomy Continence Consultation / Progress Note    Date Time: 08/26/22 10:21 AM  Patient Name: Shelby Barron, Shelby Barron  Consulting Service: [facility] Day: 6     Reason for Consult / Follow Up   F/U d/t continued hospitalization s/p OR debridement    Assessment & Plan   Assessment:   Abdominal wound with NPWT dressing in place, dressing compressed without leak.       Wound 06/30/22 Abdomen Anterior (Active)   Date First Assessed/Time First Assessed: 06/30/22 1152   Wound Type: Surgical Incision  Location: Abdomen  Wound Location Orientation: Anterior      Assessments 08/26/2022 10:00 AM   Treatments Negative Pressure Wound Therapy       Negative Pressure Wound Therapy Abdomen (Active)   Placement Date/Time: 08/25/22 1258   Present on Admission?: Yes  Wound Type: Surgical  Location: Abdomen  Wound Location Orientation: Anterior;Medial;Lower      Assessments 08/26/2022 10:00 AM   Therapy Type Traditional   Dressing Status Compressed   Target Pressure (mmHg) 125   Negative Pressure Type Continuous   Intensity Low   Drainage type Sanguinous      Plan/Follow-Up:   Planned for NPWT dressing change Friday 8/9 should pt remain hospitalized without plans for discharge  NPWT dressing changes MWF, settings 111mmHg/low/continuous    History of Present Illness   This is a 82 y.o. female  has a past medical history of Acid reflux, Asthma, well controlled, Cancer of kidney, Chronic lower back pain, Congestive heart disease, COPD (chronic obstructive pulmonary disease), CVA (cerebral infarction) (01/12/2003), Diabetes, Diabetic nephropathy, Fibroids, Gout, H/O: gout, Hyperlipidemia, Hypertension, Neuropathy of hand, and SOB (shortness of breath)..  Admitted with Hemodialysis catheter dysfunction, initial encounter.        Procedure(s):  DEBRIDEMENT & IRRIGATION, WOUND  WOUND VAC APPLICATION    3 Days Post-Op  -------------------    Procedure(s):  DEBRIDEMENT & IRRIGATION, WOUND  WOUND VAC APPLICATION    1 Day  Post-Op  -------------------        Betsey Amen MSN, RN, CWOCN  Wound and Ostomy Service  [phone]

## 2022-08-26 NOTE — Progress Notes (Addendum)
Unable to present IMM/Acknowledgment of Outpatient/Observation Status Form or OBS letter in person due to (check what applies):     Patient is on COVID/TB isolation __N/A__  Patient is confused/intubated/unable to participate ___X___        Sherron Monday to patient/family member [name] patient daughter via phone to review 2nd IMM Letter who acknowledged and verbalized understanding of the letter.  A copy of 2nd IMM Letter will be mailed to patient's residence.        Prudencio Pair, CMS

## 2022-08-26 NOTE — Progress Notes (Signed)
Anesthesia Post-Op Check    Patient is drowsy , oriented to self only with history of dementia. Patient is unable to answer questions post op questions appropriately.   Marland Kitchen

## 2022-08-26 NOTE — Progress Notes (Signed)
POST OPERATIVE PROGRESS NOTE  [facility] [phone]    Date Time: 08/26/22 6:59 AM  Patient Name: Shelby Barron, Shelby Barron      ASSESSMENT:   1 Day Post-Op S/P Procedure(s):  DEBRIDEMENT & IRRIGATION, WOUND  WOUND VAC APPLICATION  Minor wound bleeding last night, but h/h dropped enough for transfusion.  PLAN:   Continue vac for next few weeks.  Will be available for any other issues.        SUBJECTIVE:   The patient is doing well.  Post operative pain is well controlled with medications.  Current dietary status:  Ensure Plus High Protein Supplement Quantity: A. One; Flavor: Strawberry; Frequency: TID (3 times a day) with meals  Juven Quantity: A. One; Frequency: BID (2 times a day) - with Breakfast and Dinner  Adult diet Therapeutic/ Modified; Solid; Minced and moist (IDDSI level 5); Thin (IDDSI level 0) and tolerating well.  Flatus: no. BM:  no.  Additional complaints: none       OBJECTIVE:   Current Vitals:   Vitals:    08/26/22 0641   BP: 109/51   Pulse: 81   Resp: 20   Temp: 98.6 F (37 C)   SpO2: 98%       Intake and Output Summary (Last 24 hours):  I/O last 3 completed shifts:  In: 100 [I.V.:100]  Out: 1700 [Other:1700]    Labs:     Results       Procedure Component Value Units Date/Time    Prepare / Crossmatch Red Blood Cells:  One Unit, 1 Units [[id number]] Collected: 08/25/22 0558     Updated: 08/26/22 0652     RBC Leukoreduced RBC LR Bag 1     Unit Number [id number]     Product Status issued     Product Code [id number]     ISBT CODE 5100     Expiration Date [id number]    APTT [[id number]]  (Abnormal) Collected: 08/26/22 0407    Specimen: Blood, Venous Updated: 08/26/22 0508     PTT 72 sec     Magnesium [[id number]]  (Normal) Collected: 08/26/22 0407    Specimen: Blood, Venous Updated: 08/26/22 0449     Magnesium 1.9 mg/dL     Renal Function Panel [[id number]]  (Abnormal) Collected: 08/26/22 0407    Specimen: Blood, Venous Updated: 08/26/22 0449     Glucose 117 mg/dL      Sodium 347 mEq/L      Potassium 4.4 mEq/L       Chloride 106 mEq/L      CO2 23 mEq/L      BUN 28 mg/dL      Calcium 8.6 mg/dL      Creatinine 4.5 mg/dL      Albumin 2.2 g/dL      Phosphorus 2.9 mg/dL      Anion Gap 42.5     GFR 9.1 mL/min/1.73 m2     CBC without Differential [[id number]]  (Abnormal) Collected: 08/26/22 0407    Specimen: Blood, Venous Updated: 08/26/22 0418     WBC 17.37 x10 3/uL      Hemoglobin 7.0 g/dL      Hematocrit 33.2 %      Platelet Count 398 x10 3/uL      MPV 9.0 fL      RBC 2.37 x10 6/uL      MCV 94.1 fL      MCH 29.5 pg      MCHC 31.4 g/dL      RDW 16 %  nRBC % 0.1 /100 WBC      Absolute nRBC 0.02 x10 3/uL     Whole Blood Glucose POCT [[id number]]  (Abnormal) Collected: 08/25/22 2010    Specimen: Blood, Capillary Updated: 08/25/22 2025     Whole Blood Glucose POCT 149 mg/dL     Whole Blood Glucose POCT [[id number]]  (Abnormal) Collected: 08/25/22 1253    Specimen: Blood, Capillary Updated: 08/25/22 1302     Whole Blood Glucose POCT 101 mg/dL     Whole Blood Glucose POCT [[id number]]  (Abnormal) Collected: 08/25/22 1118    Specimen: Blood, Capillary Updated: 08/25/22 1119     Whole Blood Glucose POCT 108 mg/dL     Type and Screen [[id number]] Collected: 08/25/22 0558    Specimen: Blood, Venous Updated: 08/25/22 6295     ABO Rh O Pos     Antibody Screen Negative     Type And Screen Expiration 08/28/2022 23:59            Rads:     Radiology Results (24 Hour)       ** No results found for the last 24 hours. **            Physical Exam:     Mental status - drowsy  Chest - clear to auscultation, no wheezes, rales or rhonchi, symmetric air entry  Heart - normal rate, regular rhythm, normal S1, S2, no murmurs, rubs, clicks or gallops  Abdomen - soft, nontender, nondistended, no masses or organomegaly  Wound - VAC in place, minimal bloody output in canister  Extremities - peripheral pulses normal, no pedal edema, no clubbing or cyanosis      Signed by: Berle Mull, MD

## 2022-08-26 NOTE — Progress Notes (Addendum)
Nutritional Support Services  Nutrition Follow Up    Shelby Barron 82 y.o. female   MRN: [id number]    Summary of Nutrition Recommendations:      Continue regular diet- texture per SLP  - 1:1 assistance with feeding at all meals  Ensure Plus HP TID- strawberry  Juven BID for wound healing  Monitor and document po intake  Continue megace for appetite stimulation    -----------------------------------------------------------------------------------------------------------------                                                        Assessment Data:   Nutrition: Pt seen at bedside, resting. Hx of dementia, unable to participate in assessment. No family in room. 50% intake of Ensure observed on tray stand. RN reporting that pt has not been eating much but will drink some of ONS shakes at meal times. Pt's care giver has been coming in the evenings to assists and encourage intakes at dinner time. Overall, pt continuing with inadequate intake of energy/ protein needs (increased with HD). If poor intakes continue/ worsens, do not recommend initiation of enteral nutrition given hx of dementia. See statement below. No documentation of N/V/D/C. Last BM Date: 08/18/22- question accuracy.      American Ger[employer] Society Position Statement on feeding tube placement in patients with Dementia:  "When eating difficulties arise, feeding tubes are not recommended for older adults with advanced dementia. Careful hand feeding should be offered because hand feeding has been shown to be as good as tube feeding for the outcomes of death, aspiration pneumonia, functional status, and comfort. Moreover, tube feeding is associated with agitation, greater use of physical and chemical restraints, healthcare use due to tube-related complications, and development of new pressure ulcers"      Hospital Admission: 82 y.o. female with PMH AF on Eliquis, chronic HFpEF, HTN, HLD, h/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 during complicated  admission at [facility], [name], RCC s/p bilateral nephrectomy, urothelial CA s/p cystectomy c/b small bowel enterotomy s/p small bowel anastomosis and abdominal closure requiring wound vac, anemia of chronic disease, COPD, h/o T2DM (last A1c 5.6 in June 2024), dysphagia, gout, admitted with malfunctioning HD catheter with need for HD and non-healing abdominal surgical wound. S/p debridement 8/7. HD to continue as scheduled MWF    Medical Hx:  has a past medical history of Acid reflux, Asthma, well controlled, Cancer of kidney, Chronic lower back pain, Congestive heart disease, COPD (chronic obstructive pulmonary disease), CVA (cerebral infarction) (01/12/2003), Diabetes, Diabetic nephropathy, Fibroids, Gout, H/O: gout, Hyperlipidemia, Hypertension, Neuropathy of hand, and SOB (shortness of breath).    PSH: has a past surgical history that includes Hernia repair; APPENDECTOMY (OPEN); Hysterectomy; tumor removed from kidney Marletta Lor 2015); Partial nephrectomy; TURBT; Drain (Other) (N/A, 05/31/2022); Tunneled Cath Placement (Permcath) (N/A, 06/01/2022); CYSTECTOMY, ILEOCONDUIT (N/A, 06/28/2022); LAPAROTOMY, NEPHRECTOMY (Bilateral, 06/28/2022); EXPLORATORY LAPAROTOMY, RESECTION SMALL BOWEL (N/A, 06/28/2022); WOUND VAC APPLICATION (N/A, 06/28/2022); CLOSURE, ENTEROTOMY, SMALL INTESTINE (06/28/2022); EXPLORATORY LAPAROTOMY (N/A, 06/30/2022); WOUND VAC APPLICATION (N/A, 06/30/2022); Tunneled Cath Removal (Permcath) (Right, 07/14/2022); Tunneled Cath Placement (Permcath) (Right, 07/14/2022); Tunneled Cath Check/Change (Permcath) (Right, 08/23/2022); DEBRIDEMENT & IRRIGATION, WOUND (N/A, 08/25/2022); and WOUND VAC APPLICATION (N/A, 08/25/2022).     Orders Placed This Encounter   Procedures    Adult diet Therapeutic/ Modified; Solid; Pureed (IDDSI level 4); Thin (  IDDSI level 0); Patient preferences: assist with meals    Ensure Plus High Protein Supplement Quantity: A. One; Flavor: Strawberry; Frequency: TID (3 times a day) with meals    Juven  Quantity: A. One; Frequency: BID (2 times a day) - with Breakfast and Dinner     Intake:  No intakes recorded in flowsheet    ANTHROPOMETRIC  Height: 165.1 cm (5\' 5" )  Weight: 81.9 kg (180 lb 8.9 oz)     Weight Change: 0    Body mass index is 30.05 kg/m.       ESTIMATED NEEDS    Total Daily Energy Needs: 1644 to 2055 kcal  Method for Calculating Energy Needs: 20 kcal - 25 kcal per kg  at 82.2 kg (Actual body weight)  Rationale: obese non icu wound HD       Total Daily Protein Needs: 85.05 to 113.4 g  Method for Calculating Protein Needs: 1.5 g - 2 g per kg at 56.7 kg (0 body weight)  Rationale: obese non icu wound HD      Total Daily Fluid Needs: 1644 to 2055 ml  Method for Calculating Fluid Needs: 1 ml per kcal energy = 1644 to 2055 kcal         Current Meds:  amiodarone, 200 mg, Daily  atorvastatin, 40 mg, Daily  b complex-vitamin c-folic acid, 1 tablet, Daily  carvedilol, 12.5 mg, Q12H SCH  cefTRIAXone, 1 g, Q24H  cinacalcet, 30 mg, QPM  darbepoetin alfa, 0.45 mcg/kg, Weekly  megestrol, 400 mg, BID Meals  midodrine, 5 mg, BID Meals  montelukast, 10 mg, QHS  oxybutynin XL, 5 mg, Daily  pantoprazole, 40 mg, Daily  petrolatum, , Q12H SCH  sevelamer carbonate, 800 mg, TID MEALS  umeclidinium-vilanterol, 1 puff, QAM  vancomycin, , See Admin Instructions       heparin infusion 25,000 units/500 mL (Cardiac/Low Intensity)         Allergies[1]    Pertinent labs:  Recent Labs   Lab 08/26/22  0407 08/25/22  0416 08/24/22  0355 08/24/22  0354 08/23/22  0438 08/22/22  0333 08/21/22  1424   Sodium 142 142 144  --  140 141 139   Potassium 4.4 4.8 4.3  --  5.1 4.5 4.8   Chloride 106 104 106  --  100 98* 100   CO2 23 22 24   --  22 23 23    BUN 28* 46* 38*  --  67* 61* 52*   Creatinine 4.5* 5.8* 4.9*  --  8.0* 6.9* 6.6*   Glucose 117* 124* 115*  --  73 60* 76   Calcium 8.6 8.1 8.2  --  8.4 9.0 9.1   Magnesium 1.9 1.9 1.9  --  2.0 2.1 2.0   Phosphorus 2.9 3.9 3.6  --  5.1*  --  4.3   GFR 9.1* 6.7* 8.2*  --  4.6* 5.4* 5.7*   WBC  17.37* 18.86*  --  16.20* 17.19* 16.64* 16.33*   Hematocrit 22.3* 25.6*  --  23.9* 25.8* 25.8* 27.4*   Hemoglobin 7.0* 8.2*  --  7.7* 8.3* 8.4* 8.9*  Nutrition Diagnosis      Severe Malnutrition related to inadequate oral intake in the setting of acute illness as evidenced by 9% weight loss x 1 month,  moderate muscle wasting (temporalis, pectoralis, deltoid) and moderate fat loss (orbital, buccal, triceps) - active                                                              Intervention     Nutrition recommendation - Please refer to top of note                                                                Monitoring/Evaluation   Monitor PO intake, weights, labs, GI function, medical treatment plan      Goals: Pt will meet >75% of estimated needs by next RD followup.       Levin Erp, RDN  Clinical Dietitian PRN         [1]   Allergies  Allergen Reactions    Metrizamide Shortness Of Breath and Nausea Only    Rosuvastatin Other (See Comments)     myopathy    myopathy   myopathy    myopathy myopathy      myopathy    myopathy, myopathy    Iodinated Contrast Media Other (See Comments)     Nausea and SOB per patient   On Dialysisi    Nausea and SOB per patient    Nausea and SOB per patient      Nausea and SOB per patient   Nausea and SOB per patient    Nausea and SOB per patient      Nausea and SOB per patient Nausea and SOB per patient      Nausea and SOB per patient Nausea and SOB per patient Nausea and SOB per patient      Nausea and SOB per patient  Nausea and SOB per patient Nausea and SOB per patient    Nausea and SOB per patient   Nausea and SOB per patient    Pioglitazone      Weight gain    Sulfa Antibiotics Hives     blister

## 2022-08-26 NOTE — Consults (Signed)
Department of Pharmacy - Vancomycin Dosing Progress Note    Patient: Shelby Barron   Room/Bed: K440/N027-25    Assessment   Vancomycin indication: SSTI  Vancomycin target pre-HD level: 15-25 mg/L    Patient weight: 81.9 kg (180 lb 8.9 oz)   Renal function: on HD MWF    Vancomycin Regimen: vancomycin given after HD on HD days      Recent Labs     08/26/22  1312   Vancomycin Random 18.9       Date Wed  8/7 Thur  8/8       HD day? no no       Vancomycin level --- 18.9       Dose given 1g ----           Pharmacy Plan     -Will dose per Pre-HD levels on HD days  -level ordered w am labs for 8/9    Pharmacy will continue to monitor drug level and HD schedule, and order vancomycin doses and labs as necessary.  Please contact pharmacy with any questions at  x [phone], or Epic Secure Chat group "[facility]."     [place], [place].D.

## 2022-08-26 NOTE — Progress Notes (Signed)
[facility] PROGRESS NOTE  [id number]      Date Time: 08/26/22 8:54 AM  Patient Name: Shelby Barron  Attending Physician: Arneta Cliche, MD    CC: follow-up ESRD    Assessment:   [name] MWF via [facility]  Malfunctioning permcath - s/p replacement 08/23/22 and now functioning without issues  Anemia due to CKD - Hgb below goal  Deconditioning s/p prolonged hospitalization at [facility]   Abdominal wound s/p debridement and irrigation and remains with wound vac in place     Recommendations:     HD to continue on MWF schedule  Start ESA; check iron studies  Monitor phos, if remains low/lower may need to hold/reduce sevelamer  Dose medications for iHD      Case discussed with: patient      Joetta Manners, MD  [facility]  [phone] (office)      Subjective:   Underwent HD with UF 1.7L - treatment shortened due to need to go to OR    Remains confused and not oriented to place or time; reports intermittent abdominal pain.    Physical Exam:     Vitals:    08/26/22 0641 08/26/22 0706 08/26/22 0732 08/26/22 0840   BP: 109/51 110/53 101/53 123/61   Pulse: 81 81 79 87   Resp: 20 19 19     Temp: 98.6 F (37 C) 99 F (37.2 C) 98.8 F (37.1 C) 98.2 F (36.8 C)   TempSrc: Oral Oral Oral Oral   SpO2: 98% 93% 92% 96%   Weight:       Height:           Intake and Output Summary (Last 24 hours) at Date Time    Intake/Output Summary (Last 24 hours) at 08/26/2022 0854  Last data filed at 08/26/2022 0600  Gross per 24 hour   Intake 100 ml   Output 1750 ml   Net -1650 ml         General: awake, alert, no acute distress  Cardiovascular: regular rate and rhythm, no murmurs, rubs or gallops  Lungs: clear to auscultation anteriorly  Abdomen: soft, dressing in place  Extremities: no LE edema  RUE with ecchymoses  R TDC    Meds:      Scheduled Meds: PRN Meds:    amiodarone, 200 mg, Oral, Daily  atorvastatin, 40 mg, Oral, Daily  b complex-vitamin c-folic acid, 1 tablet, Oral, Daily  carvedilol, 12.5 mg, Oral, Q12H  SCH  cefTRIAXone, 1 g, Intravenous, Q24H  cinacalcet, 30 mg, Oral, QPM  megestrol, 400 mg, Oral, BID Meals  midodrine, 5 mg, Oral, BID Meals  montelukast, 10 mg, Oral, QHS  oxybutynin XL, 5 mg, Oral, Daily  pantoprazole, 40 mg, Oral, Daily  petrolatum, , Topical, Q12H SCH  sevelamer carbonate, 800 mg, Oral, TID MEALS  sodium hypochlorite, , Irrigation, Q12H SCH  umeclidinium-vilanterol, 1 puff, Inhalation, QAM  vancomycin, , Intravenous, See Admin Instructions           sodium chloride, , PRN  acetaminophen, 650 mg, Q4H PRN  benzocaine-menthol, 1 lozenge, Q2H PRN  benzonatate, 100 mg, TID PRN  carboxymethylcellulose sodium, 1 drop, TID PRN  dextrose, 15 g of glucose, PRN   Or  dextrose, 12.5 g, PRN   Or  dextrose, 12.5 g, PRN   Or  glucagon (rDNA), 1 mg, PRN  heparin (porcine), 3,000 Units, PRN  melatonin, 3 mg, QHS PRN  morphine, 2 mg, Q8H PRN  naloxone, 0.2 mg, PRN  oxyCODONE, 5 mg, Q6H PRN  polyethylene glycol, 17 g, Daily PRN  saline, 2 spray, Q4H PRN              Labs:     Recent Labs   Lab 08/26/22  0407 08/25/22  0416 08/24/22  0354   WBC 17.37* 18.86* 16.20*   Hemoglobin 7.0* 8.2* 7.7*   Hematocrit 22.3* 25.6* 23.9*   Platelet Count 398* 470* 427*     Recent Labs   Lab 08/26/22  0407 08/25/22  0416 08/24/22  0355   Sodium 142 142 144   Potassium 4.4 4.8 4.3   Chloride 106 104 106   CO2 23 22 24    BUN 28* 46* 38*   Creatinine 4.5* 5.8* 4.9*   Calcium 8.6 8.1 8.2   Albumin 2.2* 1.4* 1.4*   Phosphorus 2.9 3.9 3.6   Magnesium 1.9 1.9 1.9   Glucose 117* 124* 115*   GFR 9.1* 6.7* 8.2*             Invalid input(s): "LEUKOCYTESUR"        Imaging personally reviewed, including: No results found.        Signed by: Joetta Manners, MD

## 2022-08-26 NOTE — SLP Progress Note (Signed)
SLP Treatment Note    Patient: Shelby Barron    MRN#: [id number]   Unit: [facility]  Bed: G295/M841-32    Recommendations:   Solids:  Pureed Solids (IDDSI PU4)  Liquids: Thin Liquids (IDDSI TN0) any modality  Meds: PO, crushed, with puree (IDDSI level 4)     Precautions/Compensations: alert and awake, upright positioning, alternate bites and sips, oral care before and after meals, eat and drink slowly, check for pocketing  Supervision: Feeding assistance needed  Aspiration precautions posted at bedside: yes    Referrals: none     Plan of care: begin/continue oral diet and patient/family education  Assessment:       Clinical signs and symptoms of oral and pharyngeal dysphagia, likely acute related to recent surgery, s/p CVA, weakness and lethargy. Pt with increased time with oral stage of swallow, increased lingual residuals, oral defensiveness at times. Delayed swallow initiations with puree/solids. Oral residuals with solids removed with swab. Swallow prognosis is fair.Instrumental swallow study is not indicated. Given lethargy, pt is at risk of aspiration. SLP will continue to follow for dysphagia (swallowing) therapy.     Rehabilitation Prognosis: good due to family/community support, PLOF, and severity of impairment    Education:   Risks and benefits of evaluation discussed with patient and RN. Educated on role of speech therapy,  results of VFSS, plan of care, and goals of therapy and in agreement with POC. Educated on recommended diet, oral infection control, risks of aspiration, and s/s of aspiration. Demonstrated minimal understanding of results and recommendations.     Subjective:   Interpreter utilized: no, not indicated     Patient is agreeable to participation in the therapy session. Nursing clears patient for therapy. Patient's medical condition is appropriate for Speech therapy intervention at this time. No  family at bedside.     PAIN: no     Behavior/cognition: awake, cooperative, and  lethargic    Orientation: Oriented to self    Objective:   Current diet: Minced and Moist solids (MM5)  Position: in bed  Medical equipment in place: IV   Respiratory status: room air  Precautions: fall and aspiration    Oral care provided: []  Y []  Completed by Medical Team    IDDSI oral trials:  Ice   Oral: slow/reduced oral manipulation     Pharyngeal: no overt signs or symptoms of aspiration       Amount: 2 chips     Thin   (Th0)  via Tsp, Cup Edge, Straw Oral: WFL     Pharyngeal: no overt signs or symptoms of aspiration       Amount: 1 oz/ few sips       Puree   (PU4)   Oral: slow/reduced oral manipulation  delayed initiation of a/p transit ; oral defensiveness    Pharyngeal: no overt signs or symptoms of aspiration       Amount: 5 tsp       Regular Solids   (RG7)   Oral: slow/reduced oral manipulation  delayed initiation of a/p transit  lingual residue ; residue was removed with swab    Pharyngeal: no overt signs or symptoms of aspiration       Amount: 1 cracker     3 oz water:  Not completed   n/a       Esophageal Phase: unremarkable    Goals:   Goals  Patient will use lingual sweep to clear buccal cavity to reduce residue from oral cavity during  p.o. intake: with minimal verbal instruction, in 5 sessions  Patient will demonstrate adequate oropharyngeal phase skills for a: Thin Liquids (IDDSI TN0), Soft and bite Sized (IDDSI SB6)    Patient left with call bell within reach, all needs met, SCDs in place, and all questions answered. RN, Hassie Bruce, notified via secure chat of session outcome and patient response including results and recommendations of dysphagia evaluation including but not limited to current functional status, amount of oral intake, level of supervision for oral intake, medication administration, diet and POC.    Treatment Time: Start Time: 1200 to Stop Time: 1228    Signature: Evalee Mutton MA, CCC-SLP  Speech Language Pathologist    Speech Language Pathology

## 2022-08-27 ENCOUNTER — Inpatient Hospital Stay: Payer: Medicare Other

## 2022-08-27 LAB — CBC
Absolute nRBC: 0.02 10*3/uL — ABNORMAL HIGH (ref <=0.00)
Hematocrit: 32.6 % — ABNORMAL LOW (ref 34.7–43.7)
Hemoglobin: 10.5 g/dL — ABNORMAL LOW (ref 11.4–14.8)
MCH: 29.6 pg (ref 25.1–33.5)
MCHC: 32.2 g/dL (ref 31.5–35.8)
MCV: 91.8 fL (ref 78.0–96.0)
MPV: 9.5 fL (ref 8.9–12.5)
Platelet Count: 450 10*3/uL — ABNORMAL HIGH (ref 142–346)
RBC: 3.55 10*6/uL — ABNORMAL LOW (ref 3.90–5.10)
RDW: 16 % — ABNORMAL HIGH (ref 11–15)
WBC: 15.5 10*3/uL — ABNORMAL HIGH (ref 3.10–9.50)
nRBC %: 0.1 /100 WBC — ABNORMAL HIGH (ref <=0.0)

## 2022-08-27 LAB — RENAL FUNCTION PANEL
Albumin: 2 g/dL — ABNORMAL LOW (ref 3.5–5.0)
Anion Gap: 16 — ABNORMAL HIGH (ref 5.0–15.0)
BUN: 36 mg/dL — ABNORMAL HIGH (ref 7–21)
CO2: 22 mEq/L (ref 17–29)
Calcium: 9.2 mg/dL (ref 7.9–10.2)
Chloride: 104 mEq/L (ref 99–111)
Creatinine: 5.2 mg/dL — ABNORMAL HIGH (ref 0.4–1.0)
GFR: 7.6 mL/min/{1.73_m2} — ABNORMAL LOW (ref >=60.0)
Glucose: 158 mg/dL — ABNORMAL HIGH (ref 70–100)
Phosphorus: 3.9 mg/dL (ref 2.3–4.7)
Potassium: 4.2 mEq/L (ref 3.5–5.3)
Sodium: 142 mEq/L (ref 135–145)

## 2022-08-27 LAB — APTT
PTT: 54 s — ABNORMAL HIGH (ref 27–39)
PTT: 60 s — ABNORMAL HIGH (ref 27–39)

## 2022-08-27 LAB — D-DIMER: D-Dimer: 1.97 ug/mL FEU — ABNORMAL HIGH (ref <=0.60)

## 2022-08-27 LAB — NT-PROBNP: NT-ProBNP: 5124 pg/mL — ABNORMAL HIGH (ref <450)

## 2022-08-27 LAB — ECG 12-LEAD
Atrial Rate: 98 {beats}/min
IHS MUSE NARRATIVE AND IMPRESSION: NORMAL
P Axis: 34 degrees
P-R Interval: 146 ms
Q-T Interval: 346 ms
QRS Duration: 64 ms
QTC Calculation (Bezet): 441 ms
R Axis: -1 degrees
T Axis: 74 degrees
Ventricular Rate: 98 {beats}/min

## 2022-08-27 LAB — PROCALCITONIN: Procalcitonin: 45.03 ng/ml — ABNORMAL HIGH (ref 0.00–0.10)

## 2022-08-27 LAB — VANCOMYCIN, RANDOM: Vancomycin Random: 17.3 ug/mL

## 2022-08-27 LAB — MAGNESIUM: Magnesium: 1.8 mg/dL (ref 1.6–2.6)

## 2022-08-27 LAB — FERRITIN: Ferritin: 2340.42 ng/mL — ABNORMAL HIGH (ref 4.60–204.00)

## 2022-08-27 MED ORDER — SODIUM CHLORIDE 0.9 % IV BOLUS
100.0000 mL | INTRAVENOUS | Status: AC | PRN
Start: 2022-08-27 — End: 2022-08-27

## 2022-08-27 MED ORDER — ALBUMIN HUMAN 25 % IV SOLN
100.0000 mL | INTRAVENOUS | Status: AC | PRN
Start: 2022-08-27 — End: 2022-08-27
  Administered 2022-08-27 (×3): 25 g via INTRAVENOUS
  Filled 2022-08-27 (×3): qty 100

## 2022-08-27 MED ORDER — IOHEXOL 350 MG/ML IV SOLN
100.0000 mL | Freq: Once | INTRAVENOUS | Status: AC | PRN
Start: 2022-08-27 — End: 2022-08-27
  Administered 2022-08-27: 100 mL via INTRAVENOUS

## 2022-08-27 MED ORDER — ALBUMIN HUMAN/BIOSIMILIAR 5% IV SOLN (WRAP)
12.5000 g | Freq: Once | INTRAVENOUS | Status: AC
Start: 2022-08-27 — End: 2022-08-27
  Administered 2022-08-27: 12.5 g via INTRAVENOUS
  Filled 2022-08-27: qty 250

## 2022-08-27 MED ORDER — KETOROLAC TROMETHAMINE 30 MG/ML IJ SOLN
15.0000 mg | Freq: Once | INTRAMUSCULAR | Status: AC
Start: 2022-08-27 — End: 2022-08-27
  Administered 2022-08-27: 15 mg via INTRAVENOUS
  Filled 2022-08-27 (×2): qty 1

## 2022-08-27 MED ORDER — PIPERACILLIN-TAZOBACTAM 2.25 GM MBP (CNR)
2.2500 g | Freq: Two times a day (BID) | INTRAVENOUS | Status: DC
Start: 2022-08-27 — End: 2022-09-03
  Administered 2022-08-27 – 2022-09-03 (×15): 2250 mg via INTRAVENOUS
  Filled 2022-08-27 (×18): qty 2250

## 2022-08-27 MED ORDER — SODIUM CHLORIDE 0.9 % IV BOLUS
250.0000 mL | INTRAVENOUS | Status: AC | PRN
Start: 2022-08-27 — End: 2022-08-27

## 2022-08-27 NOTE — Progress Notes (Signed)
Admission Note-  Patient admitted to PCU #427 at 0945. Hand off report received from Medical RN.   Patient connected to tele monitor, confirmed with monitor tech.   Patient oriented to room, bed in lowest position, bed alarm activated, call bell within reach.   Fall prevention contract completed. Yes  Patient informed of visitor policy.      Brief Clinical Picture:  Neuro AXO1(Self)   Resp - 95% on 8Lmidflow  Cardiac-STachy    4 eyes in 4 hours pressure injury assessment note:      Completed with: Shannon  Unit & Time admitted: PCU 0945             Bony Prominences: Check appropriate box; if wound is present enter wound assessment in LDA     Occiput:                 [x] WNL  []  Wound present  Face:                     [x] WNL  []  Wound present  Ears:                      [x] WNL  []  Wound present  Spine:                    [x] WNL  []  Wound present  Shoulders:             [x] WNL  []  Wound present  Elbows:                  [x] WNL  []  Wound present  Sacrum/coccyx:     [] WNL  [x]  Wound present  Ischial Tuberosity:  [x] WNL  []  Wound present  Trochanter/Hip:      [x] WNL  []  Wound present  Knees:                   [x] WNL  []  Wound present  Ankles:                   [x] WNL  []  Wound present  Heels:                    [x] WNL  []  Wound present  Other pressure areas:  []  Wound location       Device related: []  Device name:         LDA completed if wound present: yes/no  Consult WOCN if necessary    Other skin related issues, ie tears, rash, etc, document in Integumentary flowsheet

## 2022-08-27 NOTE — Progress Notes (Signed)
Report to Angel, RN

## 2022-08-27 NOTE — Consults (Signed)
CONSULTATION    Date Time: 08/27/22 12:40 PM  Patient Name: Shelby Barron, Shelby Barron  Requesting Physician: Shelby Mattocks, MD      Reason for Consultation:   Aspiration pneumonia    Assessment:   82 year old female with multiple medical problems as noted below, now with acute episode of nausea and vomiting with hypoxic respiratory failure due to aspiration pneumonia.  Agree with empiric Zosyn given her prolonged hospitalizations.    Plan:   Zosyn 2.25g IV q8hrs. Aspiration precautions. Check MRSA screening.   Recommend Palliative Care consultation, though appears family has not been agreeable to this during prior admissions.  Wound management per Surgery.    Discussed with Dr. Buena Irish.  Thank you for involving [place] in the care of this patient. We will follow with you.    [place] ([id number])  [facility]  [phone]    History:   Shelby Barron is an 82 y.o. female with history of diabetes with neuropathy, hypertension, [name] (recently s/p PD), CVA, RCC status post left nephrectomy, recently diagnosed bladder cancer status post radical cystectomy, who presented to the hospital on 08/21/2022 with malfunctioning permacath.  She has had multiple recent hospitalizations with extensive stay at [facility] from 6/10 to 08/18/2022 for her cystectomy, completion nephrectomy, small bowel resection.  She was discharged to skilled nursing facility but then was sent to the ED here after a few days due to difficulties with her dialysis catheter.  This was exchanged by IR.  General surgery here has been managing her abdominal VAC and dressings and noted some breakdown of the surgical site so she underwent I&D in the OR on 8/7 with some superficial purulence/fat necrosis noted.  No cultures were sent and antibiotics were not recommended but she was started on ceftriaxone and vancomycin yesterday due to persistent leukocytosis.  Of note this is not new and has been persistent over many  weeks.  Overnight last night she had a rapid response for acute nausea and vomiting and hypoxemia with evident aspiration.  Chest x-ray demonstrates a new right lower lobe infiltrate.  She is currently requiring 8 L by medium flow nasal cannula.  Antibiotics were switched to Zosyn.  We are asked to assist with management.  Currently patient is quite ill-appearing, mumbling, and unable to provide any history so this is obtained from review of records.    Past Medical History:     Past Medical History:   Diagnosis Date    Acid reflux     Asthma, well controlled     Cancer of kidney     Chronic lower back pain     Congestive heart disease     COPD (chronic obstructive pulmonary disease)     CVA (cerebral infarction) 01/12/2003    Diabetes     Diabetic nephropathy     Fibroids     Gout     H/O: gout     Hyperlipidemia     Hypertension     Neuropathy of hand     SOB (shortness of breath)        Past Surgical History:     Past Surgical History:   Procedure Laterality Date    APPENDECTOMY (OPEN)      CLOSURE, ENTEROTOMY, SMALL INTESTINE  06/28/2022    Procedure: CLOSURE, ENTEROTOMY, SMALL INTESTINE;  Surgeon: Marlaine Hind, MD;  Location: [facility];  Service: General;;    CYSTECTOMY, ILEOCONDUIT N/A 06/28/2022    Procedure: CYSTECTOMY, RADICAL;  Surgeon: Neldon Newport, MD;  Location: [facility];  Service: Urology;  Laterality: N/A;    DEBRIDEMENT & IRRIGATION, WOUND N/A 08/25/2022    Procedure: DEBRIDEMENT & IRRIGATION, WOUND;  Surgeon: Berle Mull, MD;  Location: [place] [facility];  Service: General;  Laterality: N/A;    DRAIN (OTHER) N/A 05/31/2022    Procedure: DRAIN (OTHER);  Surgeon: Hope Pigeon, MD;  Location: [facility];  Service: Interventional Radiology;  Laterality: N/A;    EXPLORATORY LAPAROTOMY N/A 06/30/2022    Procedure: SMALL BOWEL ANASTOMOSIS, CLOSURE OF MESENTERIC DEFECT, CLOSURE OF ABDOMINAL WALL;  Surgeon: Marlaine Hind, MD;  Location: [facility];  Service: General;  Laterality: N/A;   **[id number]/ MD AVAIL [id number] OR AFTER 1600**    EXPLORATORY LAPAROTOMY, RESECTION SMALL BOWEL N/A 06/28/2022    Procedure: RESECTION SMALL BOWEL;  Surgeon: Marlaine Hind, MD;  Location: [facility];  Service: General;  Laterality: N/A;    HERNIA REPAIR      HYSTERECTOMY      LAPAROTOMY, NEPHRECTOMY Bilateral 06/28/2022    Procedure: BILATERAL OPEN RADICAL NEPHRECTOMY AND LYSIS OF ASHESION;  Surgeon: Neldon Newport, MD;  Location: [facility];  Service: Urology;  Laterality: Bilateral;    PARTIAL NEPHRECTOMY      tumor removed from kidney  cJanuary 2015    TUNNELED CATH CHECK/CHANGE [facility]) Right 08/23/2022    Procedure: TUNNELED CATH CHECK/CHANGE-permacath exchange;  Surgeon: Tamala Bari, MD;  [facility]: [facility];  Service: Interventional Radiology;  Laterality: Right;  exchange permacath - non-functional    TUNNELED CATH PLACEMENT (PERMCATH) N/A 06/01/2022    Procedure: TUNNELED CATH PLACEMENT;  Surgeon: Suszanne Finch, MD;  [facility]: [facility];  Service: Interventional Radiology;  Laterality: N/A;    TUNNELED CATH PLACEMENT (PERMCATH) Right 07/14/2022    Procedure: TUNNELED CATH PLACEMENT;  Surgeon: Hope Pigeon, MD;  [facility];  Service: Interventional Radiology;  Laterality: Right;  right IJ tunneled HD catheter placement    TUNNELED CATH REMOVAL (PERMCATH) Right 07/14/2022    Procedure: TUNNELED CATH REMOVAL;  Surgeon: Hope Pigeon, MD;  [facility]: [facility];  Service: Interventional Radiology;  Laterality: Right;  right neck/chest tunneled HD catheter removal    TURBT      WOUND VAC APPLICATION N/A 06/28/2022    Procedure: Tawni Pummel WOUND VAC APPLICATION;  Surgeon: Marlaine Hind, MD;  Location: [facility];  Service: General;  Laterality: N/A;    WOUND VAC APPLICATION N/A 06/30/2022    Procedure: WOUND VAC APPLICATION;  Surgeon: Marlaine Hind, MD;  Location: [facility];  Service: General;  Laterality: N/A;    WOUND VAC APPLICATION N/A 08/25/2022    Procedure: WOUND VAC APPLICATION;   Surgeon: Berle Mull, MD;  Location: [place];  Service: General;  Laterality: N/A;       Family History:     Family History   Problem Relation Age of Onset    Cancer Mother     Heart failure Father     Cancer Maternal Aunt     Diabetes Maternal Aunt     Cancer Paternal Aunt        Social History:     Social History     Socioeconomic History    Marital status: Married     Spouse name: Not on file    Number of children: Not on file    Years of education: Not on file    Highest education level: Not on file  Occupational History    Not on file   Tobacco Use    Smoking status: Former    Smokeless tobacco: Never   Vaping Use    Vaping status: Never Used   Substance and Sexual Activity    Alcohol use: No    Drug use: No    Sexual activity: Not Currently     Partners: Male   Other Topics Concern    Not on file   Social History Narrative    Not on file     Social Determinants of Health     Financial Resource Strain: Patient Unable To Answer (08/17/2022)    Overall Financial Resource Strain (CARDIA)     Difficulty of Paying Living Expenses: Patient unable to answer   Food Insecurity: No Food Insecurity (05/26/2022)    Hunger Vital Sign     Worried About Running Out of Food in the Last Year: Never true     Ran Out of Food in the Last Year: Never true   Transportation Needs: Patient Unable To Answer (08/17/2022)    PRAPARE - Transportation     Lack of Transportation (Medical): Patient unable to answer     Lack of Transportation (Non-Medical): Patient unable to answer   Physical Activity: Patient Unable To Answer (08/17/2022)    Exercise Vital Sign     Days of Exercise per Week: Patient unable to answer     Minutes of Exercise per Session: Patient unable to answer   Stress: Patient Unable To Answer (08/17/2022)    [employer] of Occupational Health - Occupational Stress Questionnaire     Feeling of Stress : Patient unable to answer   Social Connections: Patient Unable To Answer (08/17/2022)    Social Connection and  Isolation Panel [NHANES]     Frequency of Communication with Friends and Family: Patient unable to answer     Frequency of Social Gatherings with Friends and Family: Patient unable to answer     Attends Religious Services: Patient unable to answer     Active Member of Clubs or Organizations: Patient unable to answer     Attends Banker Meetings: Patient unable to answer     Marital Status: Patient unable to answer   Intimate Partner Violence: Not At Risk (05/26/2022)    Humiliation, Afraid, Rape, and Kick questionnaire     Fear of Current or Ex-Partner: No     Emotionally Abused: No     Physically Abused: No     Sexually Abused: No   Housing Stability: Patient Unable To Answer (08/17/2022)    Housing Stability Vital Sign     Unable to Pay for Housing in the Last Year: Patient unable to answer     Number of Times Moved in the Last Year: Not on file     Homeless in the Last Year: Patient unable to answer       Allergies:   Allergies[1]    Medications:     Current Discharge Medication List        CONTINUE these medications which have NOT CHANGED    Details   acetaminophen (TYLENOL) 500 MG tablet Take 2 tablets (1,000 mg) by mouth 3 (three) times daily      amiodarone (PACERONE) 200 MG tablet Take 1 tablet (200 mg) by mouth daily This is a maintenance dose.      apixaban (ELIQUIS) 2.5 MG Take 1 tablet (2.5 mg) by mouth every 12 (twelve) hours      atorvastatin (  LIPITOR) 40 MG tablet Take 1 tablet (40 mg) by mouth daily      b complex-vitamin c-folic acid (NEPHRO-VITE) 0.8 MG Tab Take 1 tablet by mouth daily      carvedilol (COREG) 12.5 MG tablet Take 1 tablet (12.5 mg) by mouth every 12 (twelve) hours      cinacalcet (SENSIPAR) 30 MG tablet Take 1 tablet (30 mg) by mouth every evening      conjugated estrogens (PREMARIN) vaginal cream Place vaginally every other day At bedtime      megestrol (MEGACE) 40 MG tablet Take 1 tablet (40 mg) by mouth 4 (four) times daily  Qty: 120 tablet, Refills: 0      megestrol  acetate (MEGACE) 400 MG/10ML Suspension Take 10 mLs (400 mg) by mouth daily  Qty: 480 mL, Refills: 0      !! midodrine (PROAMATINE) 10 MG tablet Take 1 tablet (10 mg) by mouth Once daily every Tuesday, Thursday and Saturday morning  Qty: 30 tablet, Refills: 0      !! midodrine (PROAMATINE) 5 MG tablet Take 1 tablet (5 mg) by mouth 2 (two) times daily with meals  Qty: 60 tablet, Refills: 0      montelukast (SINGULAIR) 10 MG tablet Take 1 tablet (10 mg) by mouth nightly      ondansetron (Zofran) 4 MG tablet Take 1 tablet (4 mg) by mouth every 8 (eight) hours as needed for Nausea  Qty: 20 tablet, Refills: 0      oxybutynin XL (DITROPAN-XL) 5 MG 24 hr tablet Take 1 tablet (5 mg) by mouth daily      pantoprazole (PROTONIX) 20 MG tablet Take 1 tablet (20 mg) by mouth every 12 (twelve) hours      !! polyethylene glycol (MIRALAX) 17 g packet Take 17 g by mouth daily as needed (constipation)      !! polyethylene glycol (MIRALAX) 17 g packet Take 17 g by mouth daily  Qty: 30 each, Refills: 0      sevelamer carbonate (RENVELA) 800 MG tablet Take 1 tablet (800 mg) by mouth 3 (three) times daily with meals      traMADol (ULTRAM) 50 MG tablet Take 1 tablet (50 mg) by mouth every 8 (eight) hours as needed for Pain      umeclidinium-vilanterol (Anoro Ellipta) 62.5-25 MCG/ACT Aerosol Pwdr, Breath Activated Inhale 1 puff into the lungs daily       !! - Potential duplicate medications found. Please discuss with provider.         Current Facility-Administered Medications   Medication Dose Route Frequency    amiodarone  200 mg Oral Daily    atorvastatin  40 mg Oral Daily    b complex-vitamin c-folic acid  1 tablet Oral Daily    carvedilol  12.5 mg Oral Q12H [facility]    cinacalcet  30 mg Oral QPM    darbepoetin alfa  0.45 mcg/kg Subcutaneous Weekly    megestrol  400 mg Oral BID Meals    midodrine  5 mg Oral BID Meals    montelukast  10 mg Oral QHS    oxybutynin XL  5 mg Oral Daily    pantoprazole  40 mg Oral Daily    petrolatum   Topical Q12H  SCH    piperacillin-tazobactam  2.25 g Intravenous Q12H    sevelamer carbonate  800 mg Oral TID MEALS    umeclidinium-vilanterol  1 puff Inhalation QAM       Review of Systems:   A  comprehensive review of systems was: Unable to obtain due to mental status    Physical Exam:     Vitals:    08/27/22 1156   BP: 121/71   Pulse: 100   Resp: 22   Temp: 98.1 F (36.7 C)   SpO2: 100%     General appearance - lethargic, ill-appearing elderly female, in NAD  Mental status - lethargic, responsive  Eyes - anicteric sclera   Chest - bibasilar rhonchi; shallow inspirations  Heart - tachycardic, regular  Abdomen - soft, mildly TTP; extensive wound with VAC dressings in place   Extremities - no edema  Skin - no rash    Lines:   Right chest Hohn catheter  PIV    Labs Reviewed:     Results       Procedure Component Value Units Date/Time    APTT [[id number]]  (Abnormal) Collected: 08/27/22 1148    Specimen: Blood, Venous Updated: 08/27/22 1227     PTT 60 sec     D-Dimer [[id number]]  (Abnormal) Collected: 08/27/22 1148    Specimen: Blood, Venous Updated: 08/27/22 1227     D-Dimer 1.97 ug/mL FEU     NT-ProBNP [[id number]] Collected: 08/27/22 1148    Specimen: Blood, Venous Updated: 08/27/22 1211    Procalcitonin [[id number]]  (Abnormal) Collected: 08/27/22 0346    Specimen: Blood, Venous Updated: 08/27/22 1108     Procalcitonin 45.03 ng/ml     Ferritin [[id number]]  (Abnormal) Collected: 08/26/22 0407    Specimen: Blood, Venous Updated: 08/27/22 0914     Ferritin 2,340.42 ng/mL     APTT [[id number]]  (Abnormal) Collected: 08/27/22 0346    Specimen: Blood, Venous Updated: 08/27/22 0507     PTT 54 sec     Magnesium [[id number]]  (Normal) Collected: 08/27/22 0346    Specimen: Blood, Venous Updated: 08/27/22 0500     Magnesium 1.8 mg/dL     Vancomycin, Random [[id number]]  (Normal) Collected: 08/27/22 0346    Specimen: Blood, Venous Updated: 08/27/22 0500     Vancomycin Random 17.3 ug/mL     Renal Function Panel [[id number]]  (Abnormal) Collected:  08/27/22 0346    Specimen: Blood, Venous Updated: 08/27/22 0500     Glucose 158 mg/dL      Sodium 616 mEq/L      Potassium 4.2 mEq/L      Chloride 104 mEq/L      CO2 22 mEq/L      BUN 36 mg/dL      Calcium 9.2 mg/dL      Creatinine 5.2 mg/dL      Albumin 2.0 g/dL      Phosphorus 3.9 mg/dL      Anion Gap 07.3     GFR 7.6 mL/min/1.73 m2     CBC without Differential [[id number]]  (Abnormal) Collected: 08/27/22 0346    Specimen: Blood, Venous Updated: 08/27/22 0435     WBC 15.50 x10 3/uL      Hemoglobin 10.5 g/dL      Hematocrit 54.6 %      Platelet Count 450 x10 3/uL      MPV 9.5 fL      RBC 3.55 x10 6/uL      MCV 91.8 fL      MCH 29.6 pg      MCHC 32.2 g/dL      RDW 16 %      nRBC % 0.1 /100 WBC      Absolute nRBC 0.02 x10 3/uL     Basic  Metabolic Panel [[id number]]  (Abnormal) Collected: 08/26/22 2320    Specimen: Blood, Venous Updated: 08/26/22 2347     Glucose 150 mg/dL      BUN 33 mg/dL      Creatinine 5.2 mg/dL      Calcium 9.2 mg/dL      Sodium 294 mEq/L      Potassium 4.4 mEq/L      Chloride 104 mEq/L      CO2 21 mEq/L      Anion Gap 18.0     GFR 7.6 mL/min/1.73 m2     Whole Blood Glucose POCT [[id number]]  (Abnormal) Collected: 08/26/22 2253    Specimen: Blood, Capillary Updated: 08/26/22 2256     Whole Blood Glucose POCT 149 mg/dL     APTT [[id number]]  (Normal) Collected: 08/26/22 1907    Specimen: Blood, Venous Updated: 08/26/22 2029     PTT 33 sec     Hemoglobin and Hematocrit [[id number]]  (Abnormal) Collected: 08/26/22 1907    Specimen: Blood, Venous Updated: 08/26/22 1943     Hemoglobin 9.5 g/dL      Hematocrit 49.6 %     Iron Profile [[id number]]  (Abnormal) Collected: 08/26/22 0407    Specimen: Blood, Venous Updated: 08/26/22 1900     Iron 23 ug/dL      UIBC <65 ug/dL      TIBC --     Iron Saturation --    Vancomycin, Random [[id number]]  (Normal) Collected: 08/26/22 1312    Specimen: Blood, Venous Updated: 08/26/22 1348     Vancomycin Random 18.9 ug/mL                Rads:   Radiological Procedure reviewed.    XR Chest AP Portable    Result Date: 08/27/2022  1. Increasing airspace opacity in the right lower lobe. 2. Stable pulmonary edema pattern. Colonel Bald, MD 08/27/2022 6:47 AM    XR Chest AP Portable    Result Date: 08/26/2022  Mild interstitial edema. Shelly Flatten, MD 08/26/2022 6:19 PM    Tunneled Cath Check/Change (Permcath)    Result Date: 08/23/2022  Right-sided PermCath exchange with new 14.5 French 19 cm PermCath with tip in the right atrium. PLAN: Catheter ready for immediate use. Darci Current, MD 08/23/2022 12:49 PM    XR Chest  AP Portable    Result Date: 08/21/2022  No significant change mild pulmonary vascular congestion and mild basal atelectasis. Hiatal hernia. Clide Cliff, MD 08/21/2022 2:35 PM    XR Chest 2 Views    Result Date: 08/20/2022   Increased interstitial markings likely edema with probable trace left effusion. Rocky Crafts, MD 08/20/2022 4:12 PM    CT Abdomen Pelvis WO IV/ WO PO Cont    Result Date: 08/11/2022  1. Colonic diverticulosis. No findings to suggest acute diverticulitis or colitis. 2. Small ascites and retroperitoneal free fluid, increased. 3. Nonspecific gallbladder distention. 4. Hiatal hernia. 5. Open midline wound. 6. Left flank hernia containing ascites. Kennyth Lose, MD 08/11/2022 4:23 PM    MRI Brain WO Contrast    Result Date: 08/11/2022  Motion degraded exam. No acute intracranial abnormality. No new interval infarct. Leota Jacobsen, MD 08/11/2022 12:51 PM    XR Chest AP Portable    Result Date: 08/11/2022  Mild left basilar airspace opacities and small bilateral pleural effusions. Judd Gaudier, MD 08/11/2022 2:48 AM    CT Head WO Contrast    Result Date: 08/11/2022   Redemonstrated multiple areas of subacute infarction with  cortical hyperdensities which favor petechial hemorrhage in the right frontal lobe and bilateral parietal lobes. These are similar to the prior examination on 07/26/2022. No evidence of new or progressive acute infarct. No mass effect. Vernie Murders, MD  08/11/2022 12:00 AM    XR Chest AP Portable    Result Date: 08/02/2022   Hypoventilation with basilar atelectasis. Mild central vascular congestion. Charlott Rakes, MD 08/02/2022 3:23 PM      Signed by: Christene Slates, MD                 [1]   Allergies  Allergen Reactions    Metrizamide Shortness Of Breath and Nausea Only    Rosuvastatin Other (See Comments)     myopathy    myopathy   myopathy    myopathy myopathy      myopathy    myopathy, myopathy    Iodinated Contrast Media Other (See Comments)     Nausea and SOB per patient   On Dialysisi    Nausea and SOB per patient    Nausea and SOB per patient      Nausea and SOB per patient   Nausea and SOB per patient    Nausea and SOB per patient      Nausea and SOB per patient Nausea and SOB per patient      Nausea and SOB per patient Nausea and SOB per patient Nausea and SOB per patient      Nausea and SOB per patient  Nausea and SOB per patient Nausea and SOB per patient    Nausea and SOB per patient   Nausea and SOB per patient    Pioglitazone      Weight gain    Sulfa Antibiotics Hives     blister

## 2022-08-27 NOTE — Provider Clarification Note (Signed)
Patient Name: [name], [name]  Account #: [id number]   MR #: [id number]  Discharge Date:        ear Ms. [name], [place] ,     Patient has documentation of  AF on Eliquis  H&P n8/03/2022.  Based on this information and the findings below, is there an additional diagnosis that is clinically appropriate for this patient?    History/Risk Factors:     Shelby Barron is a 82 y.o. female with history of diabetes and encephalopathy as well as end-stage renal disease on hemodialysis who called and about the patient.'Patient's hemodialysis catheter has been clogged and not working last Tuesday.  They have tried to work on getting it taken care of as outpatient in terms of vascular access fix.  This has been  unsuccessful.  She was seen in emergency outside yesterday and given a dose of Kayexalate with nephrology plans to try to get catheter fixed.  This is continued to be unsuccessful.     Clinical Indicators:     ED Notes: Dr. Forest Gleason, MD at 8/3:"Type 2 diabetes mellitus with chronic kidney disease on chronic dialysis, with long-term current use of insulin"    H&P: Dr.  [name],  DO at 8/3:"82 y.o. female AF on Eliquis paralysis. Atrial Fibrillation Chronic Diastolic CHF HLD, Hx. CVA"     Treatment:    -apixaban (ELIQUIS) 2.5 MG    Question: Is there an additional diagnosis that is clinically appropriate for this patient?                            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

## 2022-08-27 NOTE — Provider Clarification Note (Signed)
Patient Name: Shelby Barron, Shelby Barron  Account #: [id number]   MR #: [id number]  Discharge Date:      Dear Ms. [name], PA ,      Atrial Fibrillation is documented 08/21/2022 H&P .  Additional clarification regarding the type of Atrial Fibrillation is requested.    History/Risk Factors:     Shelby Barron is a 82 y.o. female with history of diabetes and encephalopathy as well as end-stage renal disease on hemodialysis who called and about the patient.'Patient's hemodialysis catheter has been clogged and not working last Tuesday.  They have tried to work on getting it taken care of as outpatient in terms of vascular access fix.  This has been  unsuccessful.  She was seen in emergency outside yesterday and given a dose of Kayexalate with nephrology plans to try to get catheter fixed.  This is continued to be unsuccessful.     Clinical Indicators:    H&P: Dr.  [name],  DO at 8/3:"82 y.o. female AF on Eliquis paralysis. Atrial Fibrillation Chronic Diastolic CHF HLD, Hx. CVA"     ED Notes: Dr. Forest Gleason, MD at 8/3 :"EKG interpreted by me in real-time shows: Normal sinus rhythm at 95. "    Treatment:      :-apixaban (ELIQUIS) 2.5 MG    Question: Please clarify the type of Atrial Fibrillation, if known:    Chronic Atrial Fibrillation  Paroxysmal Atrial Fibrillation  Other (please specify)   Unable to determine     Thank you,    Massomeh Anabel Bene) Pura Spice, MD, MPH, CCDS  Clinical Documentation Integrity Specialist II  [address] [facility]  Office: [phone]   Cell:    [phone]  Email: [email or web address]          Date:  08/27/2022        PROVIDER RESPONSE (Choose from list above or add free text): Paroxysmal Atrial Fibrillation

## 2022-08-27 NOTE — Progress Notes (Signed)
[facility]  HOSPITALIST  PROGRESS NOTE      Patient: Shelby Barron  Date: 08/27/2022   LOS: 5 Days  Admission Date: 08/21/2022   MRN: [id number]  Attending:  Shelby Mattocks, MD     ASSESSMENT/PLAN     Shelby Barron is a 82 y.o. female with PMH AF on Eliquis, chronic HFpEF, HTN, HLD, h/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 during complicated admission at [facility], [name], RCC s/p bilateral nephrectomy, urothelial CA s/p cystectomy c/b small bowel enterotomy s/p small bowel anastomosis and abdominal closure requiring wound vac, anemia of chronic disease, COPD, h/o T2DM (last A1c 5.6 in June 2024), dysphagia, gout, admitted with malfunctioning HD catheter with need for HD and non-healing abdominal surgical wound.    Acute Hypoxic Respiratory Failure  Aspiration PNA (acute)  - Events noted from overnight, acute episode of nausea and vomiting, followed by pt becoming increasingly hypoxemic   - Currently requiring 8L O2, pt transferred to Telemetry 08/27/22  - CXR 8/9 noted, RLL infiltrate  - Check a DDimer  - Check a BNP  - Started on IV Zosyn (renal dosing)  - Infectious Disease consulted. Appreciate recommendations. D/w Dr. Arlyss Queen.  - Strict NPO. SLP consulted.  - Aspiration precautions    -- Addendum 1630 hours, elevated D-Dimer, will obtain CT Angio Chest, d/w Attending and Nephro Dr. [name], okay to proceed with CTA. Also, Iodine noted to be listed as an allergy with a reaction listed as "nausea and occasional SOB" (however upon review of prior imaging, she has had multiple CT scans with IV contrast in the past, most recently she had a CT A/P with IV contrast in July 2024.) I additionally spoke with her daughter on the phone and she confirmed there is NO Iodione Allergy that is known.    Non-healing abdominal surgical wound s/p I&D 08/25/2022 by Dr. Morton Stall bowel enterotomy s/p small bowel anastomosis, abd closure, wound vac  - SIRS present on admission with persistent leukocytosis, tachycardia  -  Appreciate Surgery and WOCN consult and recommendations after malfunctioning wound vac removed  - In OR, large mass of necrotic fat overlying pus pocket incised and drained, no cultures  - Received IV Vanco and CTX, will discontinue, d/w Dr. Zollie Beckers VSA 8/9, no abx needed for wound, d/w Pharmacy, will discontinue Vanco  - Currently on Heparin gtt, may transition to Eliquis once cleared by Surgery  - Maintain wound vac    Acute blood loss anemia 2/to abdominal incision and drainage  Anemia of chronic kidney disease, stable  Iron deficiency anemia  Thrombocytosis, suspect reactive  - Hb dropped to 7, was given 1u PRBC 08/26/22 with good response --> Hg improved 10.5 today (08/27/22), no active signs of bleeding  - Iron studies noted  - On Aranesp per Nephro  - Monitor CBC    Malfunctioning HD Catheter  [name] MWF  H/o RCC s/p bilateral nephrectomy  Urothelial CA s/p cystectomy  - Appreciate IR and [facility] consult and recommendations  - S/p Permacath exchange by IR 08/23/22  - Continue MWF HD schedule  - Continue Midodrine for hypotension (hold for SBP>120)    AF on Eliquis  Chronic HFpEF  HTN  HLD  H/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  - Continue Amiodarone, Carvedilol  - Continue Atorvastatin   - Currently on Heparin gtt, may transition to Eliquis once cleared by Surgery    Deconditioned state s/p prolonged hospitalisation  -  PT/OT recommend SNF  - Fall precautions, skin precautions    Severe Protein Calorie Malnutrition related to inadequate oral intake in the setting of acute illness as evidenced by 9% weight loss x 1 month,  moderate muscle wasting (temporalis, pectoralis, deltoid) and moderate fat loss (orbital, buccal, triceps)   - Appreciate Nutrition consult and recommendations  - Continue Megace BID    Obesity Class I, BMI 30  - Lifestyle modifications impractical for largely bed-bound patient    D/w pt's daughter, updated twice today on phone  D/w Attending, Dr. Arlyss Queen, Dr.  [name], RN    Analgesia: Tylenol prn     FEN  Fluids: SL  Nutrition: Strict NPO 08/27/22    Monitoring:  Telemetry needed: Yes  Continuous pulse oximetry needed: Yes  Level of Care: 3    Safety Checklist  DVT prophylaxis: Chemical and / or mechanical ppx NOT indicated or contraindicated: Fully anticoagulated    Foley: Not present   IVs:  Peripheral IV   PT/OT: Not needed   Daily CBC & or Chem ordered: Yes, due to clinical and lab instability       Patient Lines/Drains/Airways Status       Active PICC Line / CVC Line / PIV Line / Drain / Airway / Intraosseous Line / Epidural Line / ART Line / Line / Wound / Pressure Ulcer / NG/OG Tube       Name Placement date Placement time Site Days    Permacath Catheter - Tunneled 07/14/22 Subclavian vein catheter Right 07/14/22  1404  -- 38    Peripheral IV 20 G Standard Anterior;Right Wrist --  --  Wrist  --    Wound 06/30/22 Surgical Incision Abdomen Other (Comment) WOUND VAC applied 06/30/22  1152  Abdomen  52    Wound 07/23/22 Skin Tear Buttocks;Coccyx Inner R and L cheek, coccyx 07/23/22  --  Buttocks;Coccyx  30                       Code Status: full code    DISPO: assisted living facility    Family Contact: As listed in medical chart    Care Plan discussed with nursing, consultants       SUBJECTIVE     Shelby Barron is seen lying in bed, ill appearing, non-verbal. On 8L O2.    MEDICATIONS     Current Facility-Administered Medications   Medication Dose Route Frequency    amiodarone  200 mg Oral Daily    atorvastatin  40 mg Oral Daily    b complex-vitamin c-folic acid  1 tablet Oral Daily    carvedilol  12.5 mg Oral Q12H [facility]    cinacalcet  30 mg Oral QPM    darbepoetin alfa  0.45 mcg/kg Subcutaneous Weekly    megestrol  400 mg Oral BID Meals    midodrine  5 mg Oral BID Meals    montelukast  10 mg Oral QHS    oxybutynin XL  5 mg Oral Daily    pantoprazole  40 mg Oral Daily    petrolatum   Topical Q12H SCH    piperacillin-tazobactam  2.25 g Intravenous Q12H    sevelamer  carbonate  800 mg Oral TID MEALS    umeclidinium-vilanterol  1 puff Inhalation QAM       ROS     All other systems not mentioned in subjective reviewed and negative - unable to obtain    PHYSICAL EXAM  Vitals:    08/27/22 0757   BP: 90/55   Pulse: (!) 104   Resp: 18   Temp: 98.6 F (37 C)   SpO2: 93%       Temperature: Temp  Min: 97.5 F (36.4 C)  Max: 99.3 F (37.4 C)  Pulse: Pulse  Min: 66  Max: 114  Respiratory: Resp  Min: 16  Max: 23  Non-Invasive BP: BP  Min: 88/54  Max: 129/65  Pulse Oximetry SpO2  Min: 90 %  Max: 100 %    Intake and Output Summary (Last 24 hours) at Date Time    Intake/Output Summary (Last 24 hours) at 08/27/2022 0973  Last data filed at 08/27/2022 0800  Gross per 24 hour   Intake 326 ml   Output 150 ml   Net 176 ml       GEN APPEARANCE: Elderly, ill appearing, lethargic, non-verbal  HEENT: NC/AT; Conjunctiva Clear, Sclerae Anicteric; MM dry  NECK: Supple  CVS: Tachycardic, S1, S2; No Murmur  LUNGS: CTAB; No Wheezes/Rhonchi/Rales  ABD: Soft; ND; generalized TTP; Large non-healing abdominal surgical wound/incision with wound vac in place post-operatively  EXT: No LE edema  SKIN: No rash or lesions  NEURO: Nonverbal and not following commands on exam; Unable to access, limited exam    LABS     Recent Labs   Lab 08/27/22  0346 08/26/22  1907 08/26/22  0407 08/25/22  0416   WBC 15.50*  --  17.37* 18.86*   RBC 3.55*  --  2.37* 2.77*   Hemoglobin 10.5* 9.5* 7.0* 8.2*   Hematocrit 32.6* 29.2* 22.3* 25.6*   MCV 91.8  --  94.1 92.4   Platelet Count 450*  --  398* 470*       Recent Labs   Lab 08/27/22  0346 08/26/22  2320 08/26/22  0407 08/25/22  0416 08/24/22  0355 08/23/22  0438   Sodium 142 143 142 142 144 140   Potassium 4.2 4.4 4.4 4.8 4.3 5.1   Chloride 104 104 106 104 106 100   CO2 22 21 23 22 24 22    BUN 36* 33* 28* 46* 38* 67*   Creatinine 5.2* 5.2* 4.5* 5.8* 4.9* 8.0*   Glucose 158* 150* 117* 124* 115* 73   Calcium 9.2 9.2 8.6 8.1 8.2 8.4   Magnesium 1.8  --  1.9 1.9 1.9 2.0       Recent  Labs   Lab 08/27/22  0346 08/26/22  0407 08/25/22  0416 08/23/22  0438 08/21/22  1424   ALT  --   --   --   --  13   AST (SGOT)  --   --   --   --  13   Bilirubin, Total  --   --   --   --  0.3   Albumin 2.0* 2.2* 1.4*  More results in Results Review 1.6*   Alkaline Phosphatase  --   --   --   --  123*   More results in Results Review = values in this interval not displayed.       Recent Labs   Lab 08/27/22  0346 08/26/22  1907 08/26/22  0407 08/24/22  0354 08/23/22  1637   INR  --   --   --   --  1.7   PT  --   --   --   --  19.8*   PTT 54* 33 72*  More results in Results Review 30  More results in Results Review = values in this interval not displayed.     RADIOLOGY     Radiological Procedure personally reviewed and concur with radiologist reports unless stated otherwise.    XR Chest AP Portable   Final Result         1. Increasing airspace opacity in the right lower lobe.   2. Stable pulmonary edema pattern.      Colonel Bald, MD   08/27/2022 6:47 AM      XR Chest AP Portable   Final Result      Mild interstitial edema.      Shelly Flatten, MD   08/26/2022 6:19 PM      Tunneled Cath Check/Change (Permcath)   Final Result      Right-sided PermCath exchange with new 14.5 French 19 cm PermCath with tip   in the right atrium.      PLAN:    Catheter ready for immediate use.      Darci Current, MD   08/23/2022 12:49 PM      XR Chest  AP Portable   Final Result      No significant change mild pulmonary vascular congestion and mild basal   atelectasis. Hiatal hernia.      Clide Cliff, MD   08/21/2022 2:35 PM          Signed,  [name], PA-C  9:38 AM 08/27/2022

## 2022-08-27 NOTE — Plan of Care (Addendum)
Pt is A/O x 1, to herself, had 1 episode vomiting last night, given zofran and started aspiration precaution, NPO after that, keeps pt sitting up at 60 degree, EKG done and BMP checking for making sure regular potassium. During the night, pt continues to have congested coughing, RT called, desaturated, pt is on 5 LPM midflow with good effectiveness. Oral suction Q 2 hrs and once coughing, yellow mucus output. Cooperated with RT deep suction by mouth. Chest Xray checked and awaiting for final result. Lft arm IV loss and ultrasound guided put in a new 20G to Lft upper arm with good tolerance. Masimo on to monitor her Oxygen. Heparin gtt not changing the rate (12 units/kg/hr). Wound vac 125 mm HG continuous low suction, having red bloody small out put. Buttuck tear moistured skin damage given aquapher and meplix with good tolerance. Pain given one time morphine last night and hold it d/t respiratory distress and drowsiness. Dressing to her permacath was oozing, changed with Wardell Heath, Char[employer] fundraiser. Continue to monitor Pt.       Problem: Moderate/High Fall Risk Score >5  Goal: Patient will remain free of falls  Outcome: Progressing  Flowsheets (Taken 08/27/2022 0100)  High (Greater than 13):   HIGH-Consider use of low bed   HIGH-Apply yellow "Fall Risk" arm band   HIGH-Bed alarm on at all times while patient in bed     Problem: Pain interferes with ability to perform ADL  Goal: Pain at adequate level as identified by patient  Outcome: Progressing

## 2022-08-27 NOTE — Progress Notes (Signed)
08/27/22 0439 08/27/22 0450   Vital Signs   Level of Consciousness  --  Responds to voice   Temp 97.5 F (36.4 C)  --    Temp Source Oral  --    Heart Rate (!) 107 (!) 109   Resp Rate 16  --    BP (!) 88/54 94/59   BP Location Right arm  --    MAP (mmHg) 65 71   Patient Position Lying Lying   Oxygen Therapy   SpO2 94 %  --    O2 Device Nasal cannula  --    O2 Flow Rate (L/min) 5 L/min  --      Pt's mews score is 4, called Star RN and messaged Dr. Kelle Darting. Albumin ordered and recheck BP every 30 mins.

## 2022-08-27 NOTE — Progress Notes (Signed)
HD started per order. Time out for the pt, consent and order done prior to the HD. Pt is alert, but disoriented. Procedure explained. Right perm catheter is patent. Labs reviewed. Report received from the primary care RN.   08/27/22 1425   Vitals   Temp 98 F (36.7 C)   Heart Rate 96   Resp Rate 16   BP 101/43   SpO2 98 %   O2 Device Nasal cannula   Machine Metrics   $Treatment Started/Capturing Charge Yes   Blood Flow Rate (mL/min) 200 mL/min   Arterial Pressure (mmHg) -80 mmHg   Venous Pressure (mmHg) 150   Dialysate Flow Rate (mL/min) 700 mL/min   Transmembrane Pressure (mmHg) 50 mmHg   Ultrafiltration Rate (mL/Hr) 570 mL/hr   Fluid Removal (ml) 0 ml   Fluid Bolus (ml) 200 ml   Dialysate K (mEq/L) 2 mEq/L   Dialysate CA (mEq/L) 2.5 mEq/L   Hemodialysis Comments   Arteriovenous Lines Secure Yes   Comments HD started per order

## 2022-08-27 NOTE — Progress Notes (Signed)
[facility] PROGRESS NOTE  [id number]      Date Time: 08/27/22 2:07 PM  Patient Name: Shelby Barron  Attending Physician: Shelby Mattocks, MD    CC: follow-up ESRD    Assessment:   [name] MWF via [facility]  Malfunctioning permcath - s/p replacement 08/23/22 and now functioning without issues  Anemia due to CKD - Hgb below goal  Deconditioning s/p prolonged hospitalization at [facility]   Abdominal wound s/p debridement and irrigation and remains with wound vac in place   Aspiration PNA    Recommendations:     HD today  Start ESA; check iron studies  Monitor phos, if remains low/lower may need to hold/reduce sevelamer  On Zosyn      Case discussed with: no one      Glenard Haring, MD  [facility]  [phone] (office)      Subjective:   Events noted.  Developed hypoxia after episode of emesis and noted to have CXR findings c/w aspiration PNA.  Now on Zosyn.  Lethargic    Physical Exam:     Vitals:    08/27/22 0610 08/27/22 0757 08/27/22 0944 08/27/22 1156   BP: 105/65 90/55 95/60  121/71   Pulse: (!) 110 (!) 104 (!) 104 100   Resp:  18 20 22    Temp:  98.6 F (37 C) 98.4 F (36.9 C) 98.1 F (36.7 C)   TempSrc:  Oral Oral Oral   SpO2:  93% 95% 100%   Weight:       Height:           Intake and Output Summary (Last 24 hours) at Date Time    Intake/Output Summary (Last 24 hours) at 08/27/2022 1407  Last data filed at 08/27/2022 0800  Gross per 24 hour   Intake --   Output 150 ml   Net -150 ml         General: awake, alert, no acute distress  Cardiovascular: regular rate and rhythm, no murmurs, rubs or gallops  Lungs: clear to auscultation anteriorly with diminished respiratory effort  Abdomen: soft, dressing in place  Extremities: trace LE edema  RUE with ecchymoses  R TDC    Meds:      Scheduled Meds: PRN Meds:    amiodarone, 200 mg, Oral, Daily  atorvastatin, 40 mg, Oral, Daily  b complex-vitamin c-folic acid, 1 tablet, Oral, Daily  carvedilol, 12.5 mg, Oral, Q12H SCH  cinacalcet, 30 mg,  Oral, QPM  darbepoetin alfa, 0.45 mcg/kg, Subcutaneous, Weekly  megestrol, 400 mg, Oral, BID Meals  midodrine, 5 mg, Oral, BID Meals  montelukast, 10 mg, Oral, QHS  oxybutynin XL, 5 mg, Oral, Daily  pantoprazole, 40 mg, Oral, Daily  petrolatum, , Topical, Q12H SCH  piperacillin-tazobactam, 2.25 g, Intravenous, Q12H  sevelamer carbonate, 800 mg, Oral, TID MEALS  umeclidinium-vilanterol, 1 puff, Inhalation, QAM           sodium chloride, , PRN  acetaminophen, 650 mg, Q4H PRN  benzocaine-menthol, 1 lozenge, Q2H PRN  benzonatate, 100 mg, TID PRN  carboxymethylcellulose sodium, 1 drop, TID PRN  dextrose, 15 g of glucose, PRN   Or  dextrose, 12.5 g, PRN   Or  dextrose, 12.5 g, PRN   Or  glucagon (rDNA), 1 mg, PRN  heparin (porcine), 3,000 Units, PRN  melatonin, 3 mg, QHS PRN  morphine, 2 mg, Q4H PRN  naloxone, 0.2 mg, PRN  ondansetron, 4 mg, Q8H PRN  oxyCODONE, 5 mg, Q6H PRN  polyethylene glycol, 17 g, Daily PRN  saline, 2 spray, Q4H PRN              Labs:     Recent Labs   Lab 08/27/22  0346 08/26/22  1907 08/26/22  0407 08/25/22  0416   WBC 15.50*  --  17.37* 18.86*   Hemoglobin 10.5* 9.5* 7.0* 8.2*   Hematocrit 32.6* 29.2* 22.3* 25.6*   Platelet Count 450*  --  398* 470*     Recent Labs   Lab 08/27/22  0346 08/26/22  2320 08/26/22  0407 08/25/22  0416   Sodium 142 143 142 142   Potassium 4.2 4.4 4.4 4.8   Chloride 104 104 106 104   CO2 22 21 23 22    BUN 36* 33* 28* 46*   Creatinine 5.2* 5.2* 4.5* 5.8*   Calcium 9.2 9.2 8.6 8.1   Albumin 2.0*  --  2.2* 1.4*   Phosphorus 3.9  --  2.9 3.9   Magnesium 1.8  --  1.9 1.9   Glucose 158* 150* 117* 124*   GFR 7.6* 7.6* 9.1* 6.7*             Invalid input(s): "LEUKOCYTESUR"        Imaging personally reviewed, including: XR Chest AP Portable    Result Date: 08/27/2022  1. Increasing airspace opacity in the right lower lobe. 2. Stable pulmonary edema pattern. Colonel Bald, MD 08/27/2022 6:47 AM    XR Chest AP Portable    Result Date: 08/26/2022  Mild interstitial edema. Shelly Flatten, MD 08/26/2022 6:19 PM         Signed by: Glenard Haring, MD

## 2022-08-27 NOTE — Progress Notes (Signed)
Patient would vac picked up by [facility] from [facility] @[phone] 

## 2022-08-27 NOTE — Progress Notes (Signed)
HD completed with no complications. Pt tolerated the TX. 3.5 hrs HD with 2.7 liter fluid removed per order as pt can tolerated. 300 ml albumin given during the HD to maintain the BP. Right perm catheter is patent. Pt is alert and disoriented. The VS is WDL. Report given to the primary care RN.   08/27/22 1800   Vitals   Temp 97.2 F (36.2 C)   Heart Rate (!) 101   Resp Rate 15   BP 133/51   SpO2 96 %   O2 Device Nasal cannula   Machine Metrics   Blood Flow Rate (mL/min) 200 mL/min   Arterial Pressure (mmHg) -176 mmHg   Venous Pressure (mmHg) 111   Dialysate Flow Rate (mL/min) 700 mL/min   Transmembrane Pressure (mmHg) 94 mmHg   Ultrafiltration Rate (mL/Hr) 1570 mL/hr   Fluid Removal (ml) 3300 ml   Fluid Bolus (ml) 200 ml   Dialysate K (mEq/L) 2 mEq/L   Dialysate CA (mEq/L) 2.5 mEq/L   Hemodialysis Comments   Arteriovenous Lines Secure Yes   Comments HD completed with no complications

## 2022-08-27 NOTE — SLP Progress Note (Signed)
Speech Language Pathology    SLP Swallowing Re-Evaluation      Patient: UNNAMED FORSYTH    MRN#: [id number]   Unit: [facility]  Bed: J093/O671-24  Time of treatment: Time Calculation  SLP Received On: 08/27/22  Start Time: 1046  Stop Time: 1058  Time Calculation (min): 12 min    Assessment:   Patient was referred for re-evaluation of swallowing following aspiration event last night.  Patient reportedly had emesis and worsening of congested cough, requiring frequent oral suctioning.  PCXR today showed increasing airspace opacity in RLL.      Patient's swallowing was non-functional at this time.  Although she was awake and responsive, she holds bolus in mouth for extended periods.  One spontaneous swallow noted at end of session.  Increased wet/gurgly upper airway congestion and wet voice quality noted during session.  High risk of aspiration at this time.  Recommend continue NPO for today.  ST to re-assess tomorrow.      Recommendations/Plan:   - Diet Recommendations: continue NPO  - Medication: non-oral  -ST to re-assess tomorrow    Subjective:   Current Diet (at time of session): NPO  Respiratory Status: nasal cannula     RN cleared patient for session; reports patient requires suctioning but clamps down on Yankauer attachment, making suctioning difficult.  Patient was awake and moving in bed, trying to remove O2, tracking movement in room.  Some unintelligible verbalization noted.   Objective:   Swallowing:  re-assessed with ice chips, thin liquids by 1/2 tsp amounts x 2 trials, puree by 1/2 tsp amounts x 2 trials, one trial mildly thick liquids.  Oral phase:  patient accepts spoon with adequate mouth closure around spoon.  Holds mouth open an allows liquids to spill from oral cavity at times.  Bolus hold noted with minimal oral movement noted in response to bolus or taste stimuli.  Weak mastication on ice chips.    Pharyngeal phase:  No swallow noted via manual palpation.  One spontaneous swallow noted at end of  session.  Increased wet/gurgly voice quality noted.     Goals:  Goals  Patient will use lingual sweep to clear buccal cavity to reduce residue from oral cavity during p.o. intake: with minimal verbal instruction, in 5 sessions  Patient will demonstrate adequate oropharyngeal phase skills for a: Thin Liquids (IDDSI TN0), Soft and bite Sized (IDDSI SB6)      Interdisciplinary Communication:  Communicated with RN via direct consult regarding results and recommendations.      Signature:  Sebastian Ache, M.A., CCC-SLP

## 2022-08-27 NOTE — Progress Notes (Signed)
Wound Ostomy Continence Consultation / Progress Note    Date Time: 08/27/22 8:59 AM  Patient Name: Shelby Barron, Shelby Barron  Consulting Service: [facility] Day: 7     Reason for Consult / Follow Up   NPWT dressing change     Assessment & Plan   Assessment:   NPWT dressing leaking this morning, starting alarming for leak alert recently.  Patient very lethargic, BP soft, on medium flow O2.   Dressing removed. Wound bed cleansed with Vashe. Lower left area of wound around 6:00-9:00 with adipose tissue and adherent tan fibrinous slough. Scattered areas of tan slough in wound bed, remainder with beefy red granular tissue. Visible suture noted in mid wound bed. Scattered areas of dry partial thickness erosion at the abdominal fold r/t MASD. Periwound prepped with skin prep spray, barrier ring applied to bottom portion of periwound. Periwound picture framed with drape, 2 pieces of foam placed into wound bed and cover with drape and reconnected back to NPWT, settings as per ordered. Initial cannister clogged, not allowing for proper seal. New cannister placed and adequate seal obtained with no leaks.  Patient to be transferred to PCU.      Wound 06/30/22 Abdomen Anterior (Active)   Date First Assessed/Time First Assessed: 06/30/22 1152   Wound Type: Surgical Incision  Location: Abdomen  Wound Location Orientation: Anterior      Assessments 08/27/2022  8:00 AM   Wound Image     Wound Base Description Adipose;Brown;Dark edges;Fibrin/slough;Full Thickness;Moist;Pink;Red;Sutures;Tan   % Granular 60   Peri-wound Description Dark edges;Dry;Denuded;Intact;Pink   Wound Length (cm) 22 cm   Wound Width (cm) 14.5 cm   Wound Depth (cm) 4.5 cm   Wound Surface Area (cm^2) 319 cm^2   Wound Volume (cm^3) 1435.5 cm^3   Closure Open   Drainage Amount Moderate   Drainage Description Sanguinous;Serosanguinous   Tunneling Yes   Tunnel 1 location/measurement 4-5 cm at 7:00-8:00 on L side   Undermining  No   Margins Open   Treatments  Cleansed;Wound Cleanser;Negative Pressure Wound Therapy;Dressing changed   Topical Hyphochlorous Acid   Dressing Transparent Film       Negative Pressure Wound Therapy Abdomen (Active)   Placement Date/Time: 08/25/22 1258   Present on Admission?: Yes  Wound Type: Surgical  Location: Abdomen  Wound Location Orientation: Anterior;Medial;Lower      Assessments 08/27/2022  8:00 AM   Therapy Type Traditional   Dressing Status Compressed   Target Pressure (mmHg) 125   Negative Pressure Type Continuous   Intensity Low   Drainage type Serosang   Output (mL) 150 mL   Action Dressing changed   Pieces of foam removed 1   Contact layer removed N/A   Pieces of foam applied 2   Type of Foam Black Foam- Simplace   Contact layer/pieces/type/accessories N/A        Plan/Follow-Up:   Recommend to continue NPWT dressing changes MWF.  Recommend Interdry to abdominal fold.  Discussed with primary RN. WOC service will continue to follow and assist as needed.     Patient Education:  N/A    Specialty Bed: Centrella Max    History of Present Illness   This is a 82 y.o. female  has a past medical history of Acid reflux, Asthma, well controlled, Cancer of kidney, Chronic lower back pain, Congestive heart disease, COPD (chronic obstructive pulmonary disease), CVA (cerebral infarction) (01/12/2003), Diabetes, Diabetic nephropathy, Fibroids, Gout, H/O: gout, Hyperlipidemia, Hypertension, Neuropathy of hand, and SOB (shortness of breath).Marland Kitchen  Admitted with Hemodialysis catheter dysfunction, initial encounter.        Procedure(s):  DEBRIDEMENT & IRRIGATION, WOUND  WOUND VAC APPLICATION    4 Days Post-Op  -------------------    Procedure(s):  DEBRIDEMENT & IRRIGATION, WOUND  WOUND VAC APPLICATION    2 Days Post-Op  -------------------        Lowry Ram BSN, RN, CWOCN  Wound and Ostomy Service  [phone]

## 2022-08-27 NOTE — Progress Notes (Addendum)
LOS Day #     SNF recommended by PT/OT on (date first recommended):  Insurance authorization required? ___ no____ (yes/no)  Referral sent on: ____2:11 pm___________ (date/time)  Facility choice provided to:___pt's family_________________  on ______unknown__________ (date/time)    Choice ___[facility]________________ (name of facility) received from patient/family on: ____8/4/2024_____________ (date)      ADMISSION PRIOR TO INSURANCE AUTH:   If requiring insurance auth, offered SNF prior to obtaining insurance auth? _____ (yes/no)  Reviewed by CM Director/PA for admission prior to insurance auth: ______ (yes/no)   Date reviewed: _____________  Outcome of CM Director/PA review: _________ (approved/not approved)      Progress on placement:  Loraine Leriche "x" on what is applicable and include progress)  Awaiting patient/family choice ____;   Awaiting insurance Auth ____;  Awaiting bed availability ____  Need to provide updated PT/OT notes ____; updated notes sent on______________________  No accepting facility at this time due to (state barrier)__________________________________  Patient discharged ___            Escalated barrier to:                                            on:    COMMENTS:  CM reviewed d/c plan and checked CarePort. Pt's family wanted to return [facility], no auth required. D/C order is pending. Pt is on wound vac now and [facility] has confirmed that they will provide their own wound vac upon return. Pt's dtr requested specific transportation company, [employer] [phone]) to transport pt upon d/c. CM continue to f/u with  d/c needs.     Willow Ora BSN. RN. RRT  Care manager I  [place] [facility]  [phone]

## 2022-08-27 NOTE — Progress Notes (Addendum)
Pt vomited yellow liquid when this RN is in the room, suction her through mouth with yankauer, large yellow mucus output. Pt has congested cough, called RT.     RT came to check on pt and claimed that there is no need for deep suction.     [place], [place] notified, keep NPO tonight till SLP re-eveluation morning, given Zofran, EKG checked and BMP lab to see potassium level.

## 2022-08-27 NOTE — Plan of Care (Signed)
Problem: Renal Instability  Goal: Fluid and electrolyte balance are achieved/maintained  Flowsheets (Taken 08/27/2022 1830)  Fluid and electrolyte balance are achieved/maintained:   Monitor/assess lab values and report abnormal values   Observe for cardiac arrhythmias   Assess and reassess fluid and electrolyte status   Monitor for muscle weakness   Follow fluid restrictions/IV/PO parameters

## 2022-08-27 NOTE — Progress Notes (Signed)
Overnight Cross Cover Note    N/v, hypoxemia.  CXR shows RLL infiltrate on my wet read, awaiting official read.  Will broaden antibiotic coverage from CTX to Zosyn      Signed,  Alphonzo Dublin M.D.  4:15 AM 08/27/2022

## 2022-08-27 NOTE — Plan of Care (Signed)
Problem: Compromised Moisture  Goal: Moisture level Interventions  Outcome: Progressing  Flowsheets (Taken 08/27/2022 1043)  Moisture level Interventions: Moisture wicking products, Moisture barrier cream     Problem: Renal Instability  Goal: Fluid and electrolyte balance are achieved/maintained  Outcome: Progressing  Flowsheets (Taken 08/27/2022 1555)  Fluid and electrolyte balance are achieved/maintained:   Monitor/assess lab values and report abnormal values   Assess and reassess fluid and electrolyte status   Monitor for muscle weakness   Observe for cardiac arrhythmias     Problem: Patient Receiving Advanced Renal Therapies  Goal: Therapy access site remains intact  Outcome: Progressing  Flowsheets (Taken 08/27/2022 1555)  Therapy access site remains intact:   Assess therapy access site   Change therapy access site dressing as needed

## 2022-08-27 NOTE — Progress Notes (Signed)
Pt's Aptt is 54, heparin gtt keeps 12 units/kg/hr per protocol.     Next Aptt check will be at 1200 pm.

## 2022-08-27 NOTE — Progress Notes (Signed)
Moved patient via hospital bed to room 427.  Personal belongings to include home wound vac moved w/patient.  Heparin hand off done with Lawanna Kobus, [employer] fundraiser.  Wound vac setting verified.  High fall precautions maintained.  Oxygen set to 8L via mid flow cannula.

## 2022-08-28 DIAGNOSIS — I4891 Unspecified atrial fibrillation: Secondary | ICD-10-CM

## 2022-08-28 LAB — RENAL FUNCTION PANEL
Albumin: 3.2 g/dL — ABNORMAL LOW (ref 3.5–5.0)
Anion Gap: 15 (ref 5.0–15.0)
BUN: 17 mg/dL (ref 7–21)
CO2: 23 mEq/L (ref 17–29)
Calcium: 10 mg/dL (ref 7.9–10.2)
Chloride: 104 mEq/L (ref 99–111)
Creatinine: 3.4 mg/dL — ABNORMAL HIGH (ref 0.4–1.0)
GFR: 12.7 mL/min/{1.73_m2} — ABNORMAL LOW (ref >=60.0)
Glucose: 111 mg/dL — ABNORMAL HIGH (ref 70–100)
Phosphorus: 2.3 mg/dL (ref 2.3–4.7)
Potassium: 3.7 mEq/L (ref 3.5–5.3)
Sodium: 142 mEq/L (ref 135–145)

## 2022-08-28 LAB — CBC
Absolute nRBC: 0.02 10*3/uL — ABNORMAL HIGH (ref <=0.00)
Hematocrit: 25.5 % — ABNORMAL LOW (ref 34.7–43.7)
Hemoglobin: 8.3 g/dL — ABNORMAL LOW (ref 11.4–14.8)
MCH: 29.6 pg (ref 25.1–33.5)
MCHC: 32.5 g/dL (ref 31.5–35.8)
MCV: 91.1 fL (ref 78.0–96.0)
MPV: 9.4 fL (ref 8.9–12.5)
Platelet Count: 402 10*3/uL — ABNORMAL HIGH (ref 142–346)
RBC: 2.8 10*6/uL — ABNORMAL LOW (ref 3.90–5.10)
RDW: 16 % — ABNORMAL HIGH (ref 11–15)
WBC: 18.28 10*3/uL — ABNORMAL HIGH (ref 3.10–9.50)
nRBC %: 0.1 /100 WBC — ABNORMAL HIGH (ref <=0.0)

## 2022-08-28 LAB — ECG 12-LEAD
Atrial Rate: 86 {beats}/min
P Axis: 56 degrees
P-R Interval: 164 ms
Q-T Interval: 386 ms
QRS Duration: 86 ms
QTC Calculation (Bezet): 461 ms
R Axis: 111 degrees
T Axis: 110 degrees

## 2022-08-28 LAB — MAGNESIUM: Magnesium: 1.9 mg/dL (ref 1.6–2.6)

## 2022-08-28 LAB — APTT: PTT: 59 s — ABNORMAL HIGH (ref 27–39)

## 2022-08-28 MED ORDER — METOPROLOL TARTRATE 5 MG/5ML IV SOLN
2.5000 mg | Freq: Once | INTRAVENOUS | Status: AC
Start: 2022-08-28 — End: 2022-08-28
  Administered 2022-08-28: 2.5 mg via INTRAVENOUS
  Filled 2022-08-28: qty 5

## 2022-08-28 MED ORDER — AMIODARONE HCL IN DEXTROSE 360-4.14 MG/200ML-% IV SOLN
0.5000 mg/min | INTRAVENOUS | Status: DC
Start: 2022-08-28 — End: 2022-08-28

## 2022-08-28 MED ORDER — AMIODARONE HCL IN DEXTROSE 360-4.14 MG/200ML-% IV SOLN
1.0000 mg/min | INTRAVENOUS | Status: DC
Start: 2022-08-28 — End: 2022-08-28

## 2022-08-28 MED ORDER — AMIODARONE HCL 200 MG PO TABS
200.0000 mg | ORAL_TABLET | Freq: Every day | ORAL | Status: DC
Start: 2022-08-29 — End: 2022-08-31

## 2022-08-28 MED ORDER — METOPROLOL TARTRATE 5 MG/5ML IV SOLN
2.5000 mg | Freq: Four times a day (QID) | INTRAVENOUS | Status: DC
Start: 2022-08-28 — End: 2022-09-01
  Administered 2022-08-28 – 2022-09-01 (×16): 2.5 mg via INTRAVENOUS
  Filled 2022-08-28 (×16): qty 5

## 2022-08-28 NOTE — Plan of Care (Signed)
Comment: Patient has wound vac - intact. Patient converted to SR this morning after IV metoprolol - EKG done. Patient refusing PO meds crushed in applesauce - MD aware - patient return to NPO status. Q2 turns. Patient c/o abdominal pain per family - PRN morphine given. Family at bedside. Will continue to monitor.    Problem: Hemodynamic Status: Cardiac  Goal: Stable vital signs and fluid balance  Outcome: Progressing  Flowsheets (Taken 08/28/2022 1850)  Stable vital signs and fluid balance:   Assess signs and symptoms associated with cardiac rhythm changes   Monitor lab values     Problem: Inadequate Gas Exchange  Goal: Adequate oxygenation and improved ventilation  Outcome: Progressing  Flowsheets (Taken 08/28/2022 1850)  Adequate oxygenation and improved ventilation:   Assess lung sounds   Monitor SpO2 and treat as needed   Position for maximum ventilatory efficiency   Consult/collaborate with Respiratory Therapy     Problem: Compromised skin integrity  Goal: Skin integrity is maintained or improved  Outcome: Progressing  Flowsheets (Taken 08/28/2022 1850)  Skin integrity is maintained or improved:   Assess Braden Scale every shift   Turn or reposition patient every 2 hours or as needed unless able to reposition self   Increase activity as tolerated/progressive mobility   Relieve pressure to bony prominences   Avoid shearing   Keep skin clean and dry   Encourage use of lotion/moisturizer on skin   Monitor patient's hygiene practices   Keep head of bed 30 degrees or less (unless contraindicated)

## 2022-08-28 NOTE — Plan of Care (Signed)
Problem: Hemodynamic Status: Cardiac  Goal: Stable vital signs and fluid balance  Outcome: Progressing  Flowsheets (Taken 08/28/2022 0036)  Stable vital signs and fluid balance:   Assess signs and symptoms associated with cardiac rhythm changes   Monitor lab values     Problem: Inadequate Gas Exchange  Goal: Adequate oxygenation and improved ventilation  Outcome: Progressing  Flowsheets (Taken 08/28/2022 0036)  Adequate oxygenation and improved ventilation:   Assess lung sounds   Monitor SpO2 and treat as needed   Monitor and treat ETCO2   Provide mechanical and oxygen support to facilitate gas exchange   Position for maximum ventilatory efficiency   Increase activity as tolerated/progressive mobility   Plan activities to conserve energy: plan rest periods   Consult/collaborate with Respiratory Therapy     Problem: Inadequate Gas Exchange  Goal: Patent Airway maintained  Outcome: Progressing  Flowsheets (Taken 08/28/2022 0036)  Patent airway maintained:   Position patient for maximum ventilatory efficiency   Suction secretions as needed   Reposition patient every 2 hours and as needed unless able to self-reposition     Problem: Pain  Goal: Pain at adequate level as identified by patient  Outcome: Progressing  Flowsheets (Taken 08/28/2022 0234)  Pain at adequate level as identified by patient:   Identify patient comfort function goal   Assess for risk of opioid induced respiratory depression, including snoring/sleep apnea. Alert healthcare team of risk factors identified.   Assess pain on admission, during daily assessment and/or before any "as needed" intervention(s)   Reassess pain within 30-60 minutes of any procedure/intervention, per Pain Assessment, Intervention, Reassessment (AIR) Cycle   Evaluate patient's satisfaction with pain management progress   Evaluate if patient comfort function goal is met   Offer non-pharmacological pain management interventions   Consult/collaborate with Pain Service   Include  patient/patient care companion in decisions related to pain management as needed

## 2022-08-28 NOTE — Progress Notes (Signed)
[facility]  HOSPITALIST  PROGRESS NOTE      Patient: Shelby Barron  Date: 08/28/2022   LOS: 6 Days  Admission Date: 08/21/2022   MRN: [id number]  Attending:  Shelby Mattocks, MD     ASSESSMENT/PLAN     Shelby Barron is a 82 y.o. female with PMH AF on Eliquis, chronic HFpEF, HTN, HLD, h/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 during complicated admission at [facility], [name], RCC s/p bilateral nephrectomy, urothelial CA s/p cystectomy c/b small bowel enterotomy s/p small bowel anastomosis and abdominal closure requiring wound vac, anemia of chronic disease, COPD, h/o T2DM (last A1c 5.6 in June 2024), dysphagia, gout, admitted with malfunctioning HD catheter with need for HD and non-healing abdominal surgical wound.    Acute Hypoxic Respiratory Failure  Aspiration PNA (acute)  - Acute episode of nausea and vomiting, followed by pt becoming increasingly hypoxemic (08/27/22)  - Currently requiring 4L O2, at max was on 8L O2  - Telemetry monitoring, Continuous pulse ox  - CXR 8/9 noted, RLL infiltrate  - DDimer 1.97. CT Angio Chest negative for PE, see report below.  - BNP 5,124. D/w Nephro Dr. [name], will remove more fluid off with HD 08/27/22.  - Continue on IV Zosyn (renal dosing). D/w Dr. Cecile Sheerer.  - Infectious Disease consulted. Appreciate recommendations.   - Strict NPO. SLP consulted.  - Aspiration precautions  - Palliative Care has seen this pt in past admissions, most recently in July 2024. PC notes reviewed, family declined to speak or meet with PC.    Paroxysmal A-Fib  - Obtain ECG  - Given high aspiration risk, pt unable to safely take po meds   - Discontinue home po Coreg. Start IV Metoprolol 2.5 mg every 6 hours.  - Started on Amiodarone gtt per [facility] recs (will hold home po Amiodarone while on IV)  - Cardiology consulted, [facility], appreciate recommendations. D/w Dr. Iver Nestle.  - Telemetry    Non-healing abdominal surgical wound s/p I&D 08/25/2022 by Dr. Morton Stall bowel enterotomy s/p small bowel anastomosis,  abd closure, wound vac  - SIRS present on admission with persistent leukocytosis, tachycardia  - Appreciate Surgery and WOCN consult and recommendations after malfunctioning wound vac removed  - In OR, large mass of necrotic fat overlying pus pocket incised and drained, no cultures  - Received IV Vanco and CTX, will discontinue, d/w Dr. Zollie Beckers VSA 8/9, no abx needed for wound, d/w Pharmacy, will discontinue Vanco  - Currently on Heparin gtt, may transition to Eliquis once cleared to take po meds  - Maintain wound vac    Acute blood loss anemia 2/to abdominal incision and drainage  Anemia of chronic kidney disease, stable  Iron deficiency anemia  Thrombocytosis, suspect reactive  - Hb dropped to 7, was given 1u PRBC 08/26/22 with good response   - No active signs of bleeding  - Iron studies noted  - On Aranesp per Nephro  - Monitor CBC    Malfunctioning HD Catheter  [name] MWF  H/o RCC s/p bilateral nephrectomy  Urothelial CA s/p cystectomy  - Appreciate IR and [facility] consult and recommendations  - S/p Permacath exchange by IR 08/23/22  - Continue MWF HD schedule  - Continue Midodrine for hypotension (hold for SBP>120)    Chronic HFpEF  HTN  HLD  H/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  - Holding home po Amiodarone and Carvedilol (npo status)  - Holding home po  Atorvastatin (npo status)  - Currently on Heparin gtt, may transition to Eliquis once cleared by Surgery    Deconditioned state s/p prolonged hospitalisation  - PT/OT recommend SNF  - Fall precautions, skin precautions    Severe Protein Calorie Malnutrition related to inadequate oral intake in the setting of acute illness as evidenced by 9% weight loss x 1 month,  moderate muscle wasting (temporalis, pectoralis, deltoid) and moderate fat loss (orbital, buccal, triceps)   - Appreciate Nutrition consult and recommendations  - Continue Megace BID    Obesity Class I, BMI 30  - Lifestyle modifications impractical for largely  bed-bound patient    D/w patient's daughter [name] and pt's husband on phone  D/w RN, Dr. Sheliah Hatch and Dr. Iver Nestle Cardio    Analgesia: Tylenol prn     FEN  Fluids: SL  Nutrition: Strict NPO 08/27/22    Monitoring:  Telemetry needed: Yes  Continuous pulse oximetry needed: Yes  Level of Care: 3    Safety Checklist  DVT prophylaxis: Chemical and / or mechanical ppx NOT indicated or contraindicated: Fully anticoagulated    Foley: Not present   IVs:  Peripheral IV   PT/OT: Not needed   Daily CBC & or Chem ordered: Yes, due to clinical and lab instability       Patient Lines/Drains/Airways Status       Active PICC Line / CVC Line / PIV Line / Drain / Airway / Intraosseous Line / Epidural Line / ART Line / Line / Wound / Pressure Ulcer / NG/OG Tube       Name Placement date Placement time Site Days    Permacath Catheter - Tunneled 07/14/22 Subclavian vein catheter Right 07/14/22  1404  -- 38    Peripheral IV 20 G Standard Anterior;Right Wrist --  --  Wrist  --    Wound 06/30/22 Surgical Incision Abdomen Other (Comment) WOUND VAC applied 06/30/22  1152  Abdomen  52    Wound 07/23/22 Skin Tear Buttocks;Coccyx Inner R and L cheek, coccyx 07/23/22  --  Buttocks;Coccyx  30                       Code Status: full code    DISPO: assisted living facility    Family Contact: As listed in medical chart    Care Plan discussed with nursing, consultants       SUBJECTIVE     Shelby Barron is seen lying in bed, ill appearing, non-verbal. On 4L O2, weaning down, seen taking off her O2 multiple times. Appears a little more awake and alert today, but not consistent in answering questions or following commands.    MEDICATIONS     Current Facility-Administered Medications   Medication Dose Route Frequency    amiodarone  200 mg Oral Daily    atorvastatin  40 mg Oral Daily    b complex-vitamin c-folic acid  1 tablet Oral Daily    carvedilol  12.5 mg Oral Q12H [facility]    cinacalcet  30 mg Oral QPM    darbepoetin alfa  0.45 mcg/kg Subcutaneous  Weekly    megestrol  400 mg Oral BID Meals    midodrine  5 mg Oral BID Meals    montelukast  10 mg Oral QHS    oxybutynin XL  5 mg Oral Daily    pantoprazole  40 mg Oral Daily    petrolatum   Topical Q12H SCH    piperacillin-tazobactam  2.25 g  Intravenous Q12H    sevelamer carbonate  800 mg Oral TID MEALS    umeclidinium-vilanterol  1 puff Inhalation QAM       ROS     All other systems not mentioned in subjective reviewed and negative - unable to obtain    PHYSICAL EXAM     Vitals:    08/28/22 0845   BP: 128/72   Pulse: (!) 116   Resp: 17   Temp: 99.1 F (37.3 C)   SpO2: 94%       Temperature: Temp  Min: 97.2 F (36.2 C)  Max: 99.9 F (37.7 C)  Pulse: Pulse  Min: 92  Max: 127  Respiratory: Resp  Min: 15  Max: 22  Non-Invasive BP: BP  Min: 89/50  Max: 137/78  Pulse Oximetry SpO2  Min: 92 %  Max: 100 %    Intake and Output Summary (Last 24 hours) at Date Time    Intake/Output Summary (Last 24 hours) at 08/28/2022 0908  Last data filed at 08/27/2022 1800  Gross per 24 hour   Intake --   Output 2700 ml   Net -2700 ml       GEN APPEARANCE: Elderly, ill appearing, lethargic, non-verbal  HEENT: NC/AT; Conjunctiva Clear, Sclerae Anicteric; MM dry  NECK: Supple  CVS: Tachycardic, Irreg, Irreg, S1, S2; No Murmur  LUNGS: Bibasilar Rales, coarse breath sounds  ABD: Soft; ND; generalized TTP; Large non-healing abdominal surgical wound/incision with wound vac in place post-operatively  EXT: No LE edema  SKIN: No rash or lesions  NEURO: Nonverbal and not following commands on exam; Unable to access, limited exam    LABS     Recent Labs   Lab 08/28/22  0341 08/27/22  0346 08/26/22  1907 08/26/22  0407   WBC 18.28* 15.50*  --  17.37*   RBC 2.80* 3.55*  --  2.37*   Hemoglobin 8.3* 10.5* 9.5* 7.0*   Hematocrit 25.5* 32.6* 29.2* 22.3*   MCV 91.1 91.8  --  94.1   Platelet Count 402* 450*  --  398*       Recent Labs   Lab 08/28/22  0341 08/27/22  0346 08/26/22  2320 08/26/22  0407 08/25/22  0416 08/24/22  0355   Sodium 142 142 143 142  142 144   Potassium 3.7 4.2 4.4 4.4 4.8 4.3   Chloride 104 104 104 106 104 106   CO2 23 22 21 23 22 24    BUN 17 36* 33* 28* 46* 38*   Creatinine 3.4* 5.2* 5.2* 4.5* 5.8* 4.9*   Glucose 111* 158* 150* 117* 124* 115*   Calcium 10.0 9.2 9.2 8.6 8.1 8.2   Magnesium 1.9 1.8  --  1.9 1.9 1.9       Recent Labs   Lab 08/28/22  0341 08/27/22  0346 08/26/22  0407 08/23/22  0438 08/21/22  1424   ALT  --   --   --   --  13   AST (SGOT)  --   --   --   --  13   Bilirubin, Total  --   --   --   --  0.3   Albumin 3.2* 2.0* 2.2*  More results in Results Review 1.6*   Alkaline Phosphatase  --   --   --   --  123*   More results in Results Review = values in this interval not displayed.       Recent Labs   Lab 08/28/22  1610 08/27/22  1148 08/27/22  0346 08/24/22  0354 08/23/22  1637   INR  --   --   --   --  1.7   PT  --   --   --   --  19.8*   PTT 59* 60* 54*  More results in Results Review 30   More results in Results Review = values in this interval not displayed.     RADIOLOGY     Radiological Procedure personally reviewed and concur with radiologist reports unless stated otherwise.    CT Angiogram Chest   Final Result         1. No pulmonary embolism.   2. Emphysema. Bibasilar lung consolidation.   3. Coronary calcification. Mild cardiomegaly.   4. Moderate hiatal hernia.   5. Ascites. Left posterior lower chest wall/abdominal wall hernia noted.      Nelta Numbers, MD   08/27/2022 6:56 PM      XR Chest AP Portable   Final Result         1. Increasing airspace opacity in the right lower lobe.   2. Stable pulmonary edema pattern.      Colonel Bald, MD   08/27/2022 6:47 AM      XR Chest AP Portable   Final Result      Mild interstitial edema.      Shelly Flatten, MD   08/26/2022 6:19 PM      Tunneled Cath Check/Change (Permcath)   Final Result      Right-sided PermCath exchange with new 14.5 French 19 cm PermCath with tip   in the right atrium.      PLAN:    Catheter ready for immediate use.      Darci Current, MD   08/23/2022 12:49 PM       XR Chest  AP Portable   Final Result      No significant change mild pulmonary vascular congestion and mild basal   atelectasis. Hiatal hernia.      Clide Cliff, MD   08/21/2022 2:35 PM          Signed,  [name], PA-C  9:08 AM 08/28/2022

## 2022-08-28 NOTE — Progress Notes (Signed)
[facility] PROGRESS NOTE  [id number]      Date Time: 08/28/22 6:40 AM  Patient Name: Shelby Barron  Attending Physician: Shelby Mattocks, MD    CC: follow-up ESRD    Assessment:   [name] MWF via [facility]  Malfunctioning permcath - s/p replacement 08/23/22 and now functioning without issues  Anemia due to CKD - Hgb below goal and ferritin elevated  Deconditioning s/p prolonged hospitalization at [facility]   Abdominal wound s/p debridement and irrigation and remains with wound vac in place   Aspiration PNA    Recommendations:     No need for HD today  On ESA - monitor H/H, which has dropped slightly  Monitor phos, if remains low/lower may need to hold/reduce sevelamer  On Zosyn  Continue midodrine PRN      Case discussed with: patient      Glenard Haring, MD  [facility]  [id number] (office)      Subjective:   Had HD yesterday with 2.8 L fluid removed.  O2 requirement down.  Went into Afib. Awake. More alert    Physical Exam:     Vitals:    08/28/22 0200 08/28/22 0231 08/28/22 0325 08/28/22 0551   BP: 116/65 119/68 120/62    Pulse: (!) 104 (!) 102 (!) 112    Resp:   20    Temp: 98.8 F (37.1 C)  99.3 F (37.4 C)    TempSrc: Oral  Axillary    SpO2: 93% 92% 96% 94%   Weight:       Height:           Intake and Output Summary (Last 24 hours) at Date Time    Intake/Output Summary (Last 24 hours) at 08/28/2022 0640  Last data filed at 08/27/2022 1800  Gross per 24 hour   Intake --   Output 2850 ml   Net -2850 ml         General: no acute distress  Cardiovascular: tachy rate and irregular rhythm, no murmurs, rubs or gallops  Lungs: clear to auscultation anteriorly with diminished respiratory effort  Abdomen: soft, dressing in place  Extremities:No LE edema  RUE with ecchymoses  R TDC    Meds:      Scheduled Meds: PRN Meds:    amiodarone, 200 mg, Oral, Daily  atorvastatin, 40 mg, Oral, Daily  b complex-vitamin c-folic acid, 1 tablet, Oral, Daily  carvedilol, 12.5 mg, Oral, Q12H  SCH  cinacalcet, 30 mg, Oral, QPM  darbepoetin alfa, 0.45 mcg/kg, Subcutaneous, Weekly  megestrol, 400 mg, Oral, BID Meals  midodrine, 5 mg, Oral, BID Meals  montelukast, 10 mg, Oral, QHS  oxybutynin XL, 5 mg, Oral, Daily  pantoprazole, 40 mg, Oral, Daily  petrolatum, , Topical, Q12H SCH  piperacillin-tazobactam, 2.25 g, Intravenous, Q12H  sevelamer carbonate, 800 mg, Oral, TID MEALS  umeclidinium-vilanterol, 1 puff, Inhalation, QAM           sodium chloride, , PRN  acetaminophen, 650 mg, Q4H PRN  benzocaine-menthol, 1 lozenge, Q2H PRN  benzonatate, 100 mg, TID PRN  carboxymethylcellulose sodium, 1 drop, TID PRN  dextrose, 15 g of glucose, PRN   Or  dextrose, 12.5 g, PRN   Or  dextrose, 12.5 g, PRN   Or  glucagon (rDNA), 1 mg, PRN  heparin (porcine), 3,000 Units, PRN  melatonin, 3 mg, QHS PRN  morphine, 2 mg, Q4H PRN  naloxone, 0.2 mg, PRN  ondansetron, 4 mg, Q8H PRN  oxyCODONE, 5 mg,  Q6H PRN  polyethylene glycol, 17 g, Daily PRN  saline, 2 spray, Q4H PRN              Labs:     Recent Labs   Lab 08/28/22  0341 08/27/22  0346 08/26/22  1907 08/26/22  0407   WBC 18.28* 15.50*  --  17.37*   Hemoglobin 8.3* 10.5* 9.5* 7.0*   Hematocrit 25.5* 32.6* 29.2* 22.3*   Platelet Count 402* 450*  --  398*     Recent Labs   Lab 08/28/22  0341 08/27/22  0346 08/26/22  2320 08/26/22  0407   Sodium 142 142 143 142   Potassium 3.7 4.2 4.4 4.4   Chloride 104 104 104 106   CO2 23 22 21 23    BUN 17 36* 33* 28*   Creatinine 3.4* 5.2* 5.2* 4.5*   Calcium 10.0 9.2 9.2 8.6   Albumin 3.2* 2.0*  --  2.2*   Phosphorus 2.3 3.9  --  2.9   Magnesium 1.9 1.8  --  1.9   Glucose 111* 158* 150* 117*   GFR 12.7* 7.6* 7.6* 9.1*             Invalid input(s): "LEUKOCYTESUR"        Imaging personally reviewed, including: CT Angiogram Chest    Result Date: 08/27/2022  1. No pulmonary embolism. 2. Emphysema. Bibasilar lung consolidation. 3. Coronary calcification. Mild cardiomegaly. 4. Moderate hiatal hernia. 5. Ascites. Left posterior lower chest  wall/abdominal wall hernia noted. Nelta Numbers, MD 08/27/2022 6:56 PM         Signed by: Glenard Haring, MD

## 2022-08-28 NOTE — SLP Progress Note (Addendum)
SLP Treatment Note    Patient: Shelby Barron    MRN#: [id number]   Unit: T4C TELEMETRY  Bed: J478/G956-21    Recommendations:   Solids:  sips and chips  Liquids: sips and chips  Meds: PO, crushed, with puree (IDDSI level 4)     Precautions/Compensations: alert and awake, small single bites/sips  Supervision: Feeding assistance needed  Aspiration precautions posted at bedside: yes    Referrals: none     Plan of care: NPO/alternative feeding  Assessment:       Clinical signs and symptoms of oral and pharyngeal dysphagia, likely acute-on-chronic related to decreased alertness/altered MS/lethargy. Pt accepted ice chips and water by tsp. Oral defensiveness with puree. Anterior oral spillage noted. Prompt swallows initiated with ice chips. Soft, unproductive, throat clearing post thin liquids, possible aspiration. Pt stated "Let me go". Improvement from 08/27/22. Swallow prognosis is fair.Instrumental swallow study is not indicated. Given decreased alertness, pt is at risk of aspiration. SLP will continue to follow for dysphagia (swallowing) therapy.     Rehabilitation Prognosis: fair due to age, medical diagnosis/prognosis, participation, and severity of impairment    Education:   Risks and benefits of evaluation discussed with patient and RN. Educated on role of speech therapy,  results of VFSS, plan of care, and goals of therapy and in agreement with POC. Educated on recommended diet, oral infection control, risks of aspiration, and s/s of aspiration. Demonstrated adequate understanding of results and recommendations.     Subjective:   Interpreter utilized: no, not indicated     Patient is agreeable to participation in the therapy session. Nursing clears patient for therapy. Patient's medical condition is appropriate for Speech therapy intervention at this time. No family at bedside.     PAIN: no     Behavior/cognition: lethargic    Orientation: Oriented to self    Objective:   Current diet: NPO   Position: in  bed  Medical equipment in place: IV   Respiratory status:  2 L O2 via NC  Precautions: aspiration    Oral care provided: []  Y []  Completed by Medical Team    IDDSI oral trials:  Ice   Oral: munching mastication pattern  slow/reduced oral manipulation     Pharyngeal: no overt signs or symptoms of aspiration       Amount: 5 chips     Thin   (Th0)  via Tsp Oral: reduced labial seal with anterior spillage  unable to extract from straw  reduced oral/lingual control  slow/reduced oral manipulation     Pharyngeal: delayed throat clearing with thin via tsp  (weak and inconsistent)      Amount: 2 oz         3 oz water:  Not completed   n/a       Esophageal Phase: unremarkable    Goals:   Goals  Patient will use lingual sweep to clear buccal cavity to reduce residue from oral cavity during p.o. intake: with minimal verbal instruction, in 5 sessions  Patient will demonstrate adequate oropharyngeal phase skills for a: Thin Liquids (IDDSI TN0), Soft and bite Sized (IDDSI SB6)    Patient left with call bell within reach, all needs met, SCDs in place, and all questions answered. RN, Lindaann Slough, notified via secure chat of session outcome and patient response including results and recommendations of dysphagia evaluation including but not limited to current functional status, amount of oral intake, level of supervision for oral intake, medication administration, diet and POC.  Treatment Time: Start Time: 1245 to Stop Time: 1300    Signature: Evalee Mutton MA, Copywriter, [employer]

## 2022-08-28 NOTE — Consults (Addendum)
[facility] CONSULTATION REPORT    [facility] TELEMETRY  [address]  [place] [address]  Dept: [phone]  Loc: [phone]      Date Time: 08/28/22 11:09 AM  Patient Name: Shelby Barron  Requesting Physician: Shelby Mattocks, MD    Reason for Consultation:   Paroxysmal atrial fibrillation    History of Present Illness:   Shelby Barron is a 82 y.o. female admitted on 08/21/2022.  We have been asked by Shelby Mattocks, MD to provide cardiac consultation regarding paroxysmal atrial fibrillation.    She has a history of significant paroxysmal atrial arrhythmias including atrial fibrillation and tachycardia with a history of a recent CVA and residual left upper extremity flaccid paralysis, ESRD on hemodialysis, renal cell carcinoma status post bilateral nephrectomy, urothelial cancer status post cystectomy and small bowel enterotomy status post small bowel anastomosis and abdominal closure requiring wound VAC, chronic heart failure with preserved ejection fraction, hypertension, hyperlipidemia, COPD, history of diabetes, dysphagia, gout. Recent admission to [facility] 06/28/2022 for cystectomy and complete nephrectomy complicated by intraoperative enterotomy and hemorrhage, and then underwent bowel resection ultimately on 06/30/2022. She is now admitted with a malfunctioning hemodialysis catheter and nonhealing abdominal surgical wound.     She underwent a permacath exchange 08/23/2022.  She was taken to the OR 08/25/2022 for nonhealing abdominal surgical wound and underwent an I&D. She is currently n.p.o. following an episode of nausea and vomiting 8/8 evening leading to likely aspiration pneumonia.  She is requiring nasal cannula and has a right lower lobe infiltrate at this time.   CT angio chest shows bibasilar lung consolidation, coronary calcification, ascites.   She is currently on IV antibiotics. Her hemoglobin dropped to 7 on 08/26/2022 morning and she was  given 1 unit of PRBCs with good response to 10.5.      Overnight, the patient converted from NSR atrial fibrillation/atrial tachycardia with HR in the 100s.  This morning she has worsening leukocytosis to 18, decreased hemoglobin to 8.3. Upon seeing her this morning she is awake.  She does not respond appropriately to questions when asked where she is or the year.  Denies any symptoms at this time.    Cardiology relevant medical records in Epic chart reviewed.    Past Medical/Surgical History:   Patient  has a past medical history of Acid reflux, Asthma, well controlled, Cancer of kidney, Chronic lower back pain, Congestive heart disease, COPD (chronic obstructive pulmonary disease), CVA (cerebral infarction) (01/12/2003), Diabetes, Diabetic nephropathy, Fibroids, Gout, H/O: gout, Hyperlipidemia, Hypertension, Neuropathy of hand, and SOB (shortness of breath).    Patient  has a past surgical history that includes Hernia repair; APPENDECTOMY (OPEN); Hysterectomy; tumor removed from kidney Marletta Lor 2015); Partial nephrectomy; TURBT; Drain (Other) (N/A, 05/31/2022); Tunneled Cath Placement (Permcath) (N/A, 06/01/2022); CYSTECTOMY, ILEOCONDUIT (N/A, 06/28/2022); LAPAROTOMY, NEPHRECTOMY (Bilateral, 06/28/2022); EXPLORATORY LAPAROTOMY, RESECTION SMALL BOWEL (N/A, 06/28/2022); WOUND VAC APPLICATION (N/A, 06/28/2022); CLOSURE, ENTEROTOMY, SMALL INTESTINE (06/28/2022); EXPLORATORY LAPAROTOMY (N/A, 06/30/2022); WOUND VAC APPLICATION (N/A, 06/30/2022); Tunneled Cath Removal (Permcath) (Right, 07/14/2022); Tunneled Cath Placement (Permcath) (Right, 07/14/2022); Tunneled Cath Check/Change (Permcath) (Right, 08/23/2022); DEBRIDEMENT & IRRIGATION, WOUND (N/A, 08/25/2022); and WOUND VAC APPLICATION (N/A, 08/25/2022).    Family History:   Patient's family history includes Cancer in her maternal aunt, mother, and paternal aunt; Diabetes in her maternal aunt; Heart failure in her father.    Social History:   Patient  reports that she has quit  smoking. She has never used smokeless  tobacco. She reports that she does not drink alcohol and does not use drugs.    Allergies:   Allergies[1]    Medications:     Current Outpatient Medications   Medication Instructions    acetaminophen (TYLENOL) 1,000 mg, Oral, 3 times daily    amiodarone (PACERONE) 200 mg, Oral, Daily, This is a maintenance dose.    apixaban (ELIQUIS) 2.5 mg, Oral, Every 12 hours scheduled    atorvastatin (LIPITOR) 40 mg, Oral, Daily    b complex-vitamin c-folic acid (NEPHRO-VITE) 0.8 MG Tab 1 tablet, Oral, Daily    carvedilol (COREG) 12.5 mg, Oral, Every 12 hours    cinacalcet (SENSIPAR) 30 mg, Oral, Every evening    conjugated estrogens (PREMARIN) vaginal cream Vaginal, Every other day, At bedtime    megestrol (MEGACE) 40 mg, Oral, 4 times daily    megestrol acetate (MEGACE) 400 mg, Oral, Daily    midodrine (PROAMATINE) 10 mg, Oral, Every Tue/Thu/Sat morning    midodrine (PROAMATINE) 5 mg, Oral, 2 times daily with meals    montelukast (SINGULAIR) 10 mg, Oral, At bedtime    ondansetron (ZOFRAN) 4 mg, Oral, Every 8 hours PRN    oxybutynin XL (DITROPAN-XL) 5 mg, Oral, Daily    pantoprazole (PROTONIX) 20 mg, Oral, Every 12 hours    polyethylene glycol (MIRALAX) 17 g, Oral, Daily PRN    polyethylene glycol (MIRALAX) 17 g, Oral, Daily    sevelamer carbonate (RENVELA) 800 mg, Oral, 3 times daily with meals    traMADol (ULTRAM) 50 mg, Oral, Every 8 hours PRN    umeclidinium-vilanterol (Anoro Ellipta) 62.5-25 MCG/ACT Aerosol Pwdr, Breath Activated 1 puff, Inhalation, Daily       Inpatient Scheduled Meds: PRN Meds:    amiodarone, 200 mg, Oral, Daily  atorvastatin, 40 mg, Oral, Daily  b complex-vitamin c-folic acid, 1 tablet, Oral, Daily  carvedilol, 12.5 mg, Oral, Q12H SCH  cinacalcet, 30 mg, Oral, QPM  darbepoetin alfa, 0.45 mcg/kg, Subcutaneous, Weekly  megestrol, 400 mg, Oral, BID Meals  midodrine, 5 mg, Oral, BID Meals  montelukast, 10 mg, Oral, QHS  oxybutynin XL, 5 mg, Oral, Daily  pantoprazole, 40  mg, Oral, Daily  petrolatum, , Topical, Q12H SCH  piperacillin-tazobactam, 2.25 g, Intravenous, Q12H  sevelamer carbonate, 800 mg, Oral, TID MEALS  umeclidinium-vilanterol, 1 puff, Inhalation, QAM        Continuous Infusions:   heparin infusion 25,000 units/500 mL (Cardiac/Low Intensity) 12 Units/kg/hr (08/28/22 1012)    sodium chloride, , PRN  acetaminophen, 650 mg, Q4H PRN  benzocaine-menthol, 1 lozenge, Q2H PRN  benzonatate, 100 mg, TID PRN  carboxymethylcellulose sodium, 1 drop, TID PRN  dextrose, 15 g of glucose, PRN   Or  dextrose, 12.5 g, PRN   Or  dextrose, 12.5 g, PRN   Or  glucagon (rDNA), 1 mg, PRN  heparin (porcine), 3,000 Units, PRN  melatonin, 3 mg, QHS PRN  morphine, 2 mg, Q4H PRN  naloxone, 0.2 mg, PRN  ondansetron, 4 mg, Q8H PRN  oxyCODONE, 5 mg, Q6H PRN  polyethylene glycol, 17 g, Daily PRN  saline, 2 spray, Q4H PRN          Physical Exam:     Vitals:    08/28/22 0325 08/28/22 0551 08/28/22 0845 08/28/22 0952   BP: 120/62  128/72 119/69   Pulse: (!) 112  (!) 116 (!) 104   Resp: 20  17    Temp: 99.3 F (37.4 C)  99.1 F (37.3 C) 99.3 F (37.4 C)  TempSrc: Axillary  Oral Oral   SpO2: 96% 94% 94% 97%   Weight:       Height:           Constitutional: Cooperative, alert, no acute distress.  Neck: No carotid bruits, JVP normal.  Cardiac: Tachycardic, regular, normal S1 and S2; no murmurs, no rubs, no gallops.  Pulmonary: Clear to auscultation bilaterally, no wheezing, no rhonchi, no rales.  Extremities: no edema.  Vascular: +2 pulses in radial artery bilaterally, 2+ pedal pulses bilaterally.    Lab/Test Findings Reviewed:     ECG reviewed: As above.  CXR reviewed: As above.    Recent Labs     08/28/22  0341   Sodium 142   Potassium 3.7   Chloride 104   CO2 23   BUN 17   Creatinine 3.4*   Calcium 10.0   Magnesium 1.9   WBC 18.28*   Hemoglobin 8.3*   Hematocrit 25.5*   Platelet Count 402*       ASSESSMENT:   Patient is a 82 y.o. female with the following relevant diagnoses:    Admitted with nonhealing  abdominal ulcer and nonfunctional hemodialysis catheter.  HD catheter was replaced 08/23/2022.  Underwent I&D in OR 08/25/2022   Aspiration PNA 8/8 evening secondary to nausea/vomiting on IV ABX   Strict NPO  History of Paroxysmal atrial arrhythmias - Currently in AF / AT   Diagnosed June 2024 at [facility] in the setting of gram-negative rod pneumonia and major abdominal surgery  CHA2DS2-VASc 7  Previously not a candidate for [facility] due to complicated intra-op course 06/28/2022 at [facility] [place] [facility] with hemorrhage.   Recent admission to [facility] 06/28/2022 for cystectomy and complete nephrectomy complicated by intraoperative enterotomy and hemorrhage.  She also underwent bowel resection ultimately on 06/30/2022  History of CVA with right upper extremity paralysis  Encephalopathy-does not respond to verbal interaction and nurse notes she is not responding to pain  Bladder cancer status post cystectomy  History of renal cell carcinoma status post bilateral nephrectomy  End-stage renal disease on hemodialysis  Hypertension  Hyperlipidemia  Anemia  Encephalopathy  Type 2 diabetes  COPD  Echocardiogram June 2024-EF 65%, grade 1 diastolic dysfunction, very mild AS with peak velocity 2.2 m/s  Carotid duplex 07/15/2022-R ICA 50 to 69% stenosis, LICA less than 50% stenosis    RECOMMENDATIONS:     Obtain ECG. Tele at this time appears more like AT though earlier in the evening it seems she was in AF. Her HR is in the 110s-120s.   At this time, she is strictly NPO given her aspiration PNA after I&D in the OR. Can administer IV metoprolol 2.5 mg every 6 hrs. Discontinue coreg 12.5 mg -- she has not received it for approximately 2 days and has been normotensive.Continue midodrine 5 mg twice daily  Initiate IV amiodarone. If she doesn't convert by 8/11 evening (appears AF started 8/9 7PM or so), must stop IV amiodarone  She was previously not a candidate for anticoagulation in June 2024 when she had her diagnosis of  atrial fibrillation, this was secondary to complicated intraoperative enterotomy hemorrhage on 06/28/2022.  It seems since then she was initiated on anticoagulation.  Currently on IV heparin.  Continue to monitor hemoglobin closely.  If it continues to drop would have to consider stopping IV heparin unfortunately.  This would not be ideal since she does have a very elevated CHA2DS2-VASc score.    Continue atorvastatin 40 mg daily  Recommendations discussed with  primary medical team.  -------------------------------------------------------------------------------------  Signed by:         Creta Levin, MD         [facility]    [facility] Contact Information   [facility]  Secure Chat (Group):   [facility] [place] [facility]    APP Spectralink:  [phone]    MD Spectralink :  [phone]  [phone]    After hours, non urgent consult line:  [phone]    After hours, physician on-call:  [phone] [facility]  Secure Chat (Group):   [facility] [place] [facility]    APP Spectralink:  [phone]    MD Spectralink :  [phone]      After hours, non urgent consult line:  [phone]    After hours, physician on-call:  [phone] [facility]  Secure Chat (Group):   [facility]    APP Spectralink:  [phone]    MD Spectralink :  [phone]      After hours, non urgent consult line:  [phone]    After hours, physician on-call:  [phone] [facility]  Secure Chat (Group):   [facility] [place] [facility]    APP Spectralink:  [phone]    MD Spectralink :  [phone]      After hours, non urgent consult line:  [phone]    After hours, physician on-call:  [phone]       This note was generated by the Dragon speech recognition and may contain errors or omissions not intended by the user. Grammatical errors, random word insertions, deletions, pronoun errors, and incomplete sentences are occasional consequences of this technology due to software limitations. Not all errors are caught or corrected. If there  are questions or concerns about the content of this note or information contained within the body of this dictation, they should be addressed directly with the author for clarification.         [1]   Allergies  Allergen Reactions    Metrizamide Shortness Of Breath and Nausea Only    Rosuvastatin Other (See Comments)     myopathy    myopathy   myopathy    myopathy myopathy      myopathy    myopathy, myopathy    Pioglitazone      Weight gain    Sulfa Antibiotics Hives     blister

## 2022-08-28 NOTE — Progress Notes (Signed)
[facility]  ID Progress Note    Reason for follow up: aspiration PNA    Subjective   Reports feeling better today.  Coughing but no phlegm.    Objective     Current Facility-Administered Medications   Medication Dose Route Frequency    amiodarone  200 mg Oral Daily    atorvastatin  40 mg Oral Daily    b complex-vitamin c-folic acid  1 tablet Oral Daily    carvedilol  12.5 mg Oral Q12H [facility]    cinacalcet  30 mg Oral QPM    darbepoetin alfa  0.45 mcg/kg Subcutaneous Weekly    megestrol  400 mg Oral BID Meals    midodrine  5 mg Oral BID Meals    montelukast  10 mg Oral QHS    oxybutynin XL  5 mg Oral Daily    pantoprazole  40 mg Oral Daily    petrolatum   Topical Q12H SCH    piperacillin-tazobactam  2.25 g Intravenous Q12H    sevelamer carbonate  800 mg Oral TID MEALS    umeclidinium-vilanterol  1 puff Inhalation QAM     Pip-tazo # 2  S/p cefazolin, ceftriaxone, vanco    Vitals:    08/28/22 0200 08/28/22 0231 08/28/22 0325 08/28/22 0551   BP: 116/65 119/68 120/62    Pulse: (!) 104 (!) 102 (!) 112    Resp:   20    Temp: 98.8 F (37.1 C)  99.3 F (37.4 C)    TempSrc: Oral  Axillary    SpO2: 93% 92% 96% 94%   Weight:       Height:         GEN: elderly female reclined upright in bed, looks weak and frail  CVS: S1S2 tachycardia, no edema  PULM: bilateral coarse breath sounds, on 2 LPM oxygen  GI: wound vac in place  SKIN: no cyanosis, no diaphoresis  NEURO: awake but not much verbal, answers at times, weak voice  PSYCH: flat affect    Lines and Drains: PIV, Foley, permacath    Recent Labs   Lab 08/28/22  0341 08/27/22  0346 08/26/22  1907 08/26/22  0407   WBC 18.28* 15.50*  --  17.37*   RBC 2.80* 3.55*  --  2.37*   Hemoglobin 8.3* 10.5* 9.5* 7.0*   Hematocrit 25.5* 32.6* 29.2* 22.3*   MCV 91.1 91.8  --  94.1   MCHC 32.5 32.2  --  31.4*   RDW 16* 16*  --  16*   MPV 9.4 9.5  --  9.0   Platelet Count 402* 450*  --  398*       Recent Labs   Lab 08/28/22  0341 08/27/22  0346 08/26/22  2320 08/26/22  0407  08/22/22  0333 08/21/22  1424   Sodium 142 142 143 142  More results in Results Review 139   Potassium 3.7 4.2 4.4 4.4  More results in Results Review 4.8   Chloride 104 104 104 106  More results in Results Review 100   CO2 23 22 21 23   More results in Results Review 23   BUN 17 36* 33* 28*  More results in Results Review 52*   Creatinine 3.4* 5.2* 5.2* 4.5*  More results in Results Review 6.6*   Calcium 10.0 9.2 9.2 8.6  More results in Results Review 9.1   Albumin 3.2* 2.0*  --  2.2*  More results in Results Review 1.6*   Protein, Total  --   --   --   --   --  4.8*   Bilirubin, Total  --   --   --   --   --  0.3   Alkaline Phosphatase  --   --   --   --   --  123*   ALT  --   --   --   --   --  13   AST (SGOT)  --   --   --   --   --  13   Glucose 111* 158* 150* 117*  More results in Results Review 76   More results in Results Review = values in this interval not displayed.     No new micro    Radiology  CT Angiogram Chest    Result Date: 08/27/2022  1. No pulmonary embolism. 2. Emphysema. Bibasilar lung consolidation. 3. Coronary calcification. Mild cardiomegaly. 4. Moderate hiatal hernia. 5. Ascites. Left posterior lower chest wall/abdominal wall hernia noted. Nelta Numbers, MD 08/27/2022 6:56 PM    XR Chest AP Portable    Result Date: 08/27/2022  1. Increasing airspace opacity in the right lower lobe. 2. Stable pulmonary edema pattern. Colonel Bald, MD 08/27/2022 6:47 AM    XR Chest AP Portable    Result Date: 08/26/2022  Mild interstitial edema. Shelly Flatten, MD 08/26/2022 6:19 PM    Tunneled Cath Check/Change (Permcath)    Result Date: 08/23/2022  Right-sided PermCath exchange with new 14.5 French 19 cm PermCath with tip in the right atrium. PLAN: Catheter ready for immediate use. Darci Current, MD 08/23/2022 12:49 PM    XR Chest  AP Portable    Result Date: 08/21/2022  No significant change mild pulmonary vascular congestion and mild basal atelectasis. Hiatal hernia. Clide Cliff, MD 08/21/2022 2:35 PM           Assessment/Plan     Shelby Barron is a 82 y.o. female with DM, ESRD-DD, h/o CVA, RCC s/p left nephrectomy, recently-diagnosed bladder ca s/p radical cystectomy. Presented on 8/3 with malfunctioning dialysis permacath which was exchanged by IR on 8/5. She was noted to have abdominal surgical wound dehiscence so she had InD in OR on 8/7. Some purulence and necrosis seen in wound. She had an episode of N/V and then hypoxic resp failure. CXR showed increasing airspace opacity in RLL so ID consulted    Aspiration pneumonia  - continue pip-tazo  - aspiration precautions  - SLP following    Abdominal surgical wound dehiscence  - s/p InD 8/7  - wound care w wound vac on board    Discussed w Molli Hazard, PA    Clearence Ped, MD  [facility]  Pager: [phone]  Epic Individual Chat Cecile Sheerer  Epic Group Chat - [facility] Larry Sierras, Bagasra et al)  Office: [phone]  Fax: [phone]   August 28, 2022

## 2022-08-29 LAB — RENAL FUNCTION PANEL
Albumin: 2.4 g/dL — ABNORMAL LOW (ref 3.5–5.0)
Anion Gap: 13 (ref 5.0–15.0)
BUN: 27 mg/dL — ABNORMAL HIGH (ref 7–21)
CO2: 25 mEq/L (ref 17–29)
Calcium: 10.2 mg/dL (ref 7.9–10.2)
Chloride: 104 mEq/L (ref 99–111)
Creatinine: 4.2 mg/dL — ABNORMAL HIGH (ref 0.4–1.0)
GFR: 9.9 mL/min/{1.73_m2} — ABNORMAL LOW (ref >=60.0)
Glucose: 72 mg/dL (ref 70–100)
Phosphorus: 2.9 mg/dL (ref 2.3–4.7)
Potassium: 3.3 mEq/L — ABNORMAL LOW (ref 3.5–5.3)
Sodium: 142 mEq/L (ref 135–145)

## 2022-08-29 LAB — CBC
Absolute nRBC: 0.04 10*3/uL — ABNORMAL HIGH (ref <=0.00)
Hematocrit: 25.7 % — ABNORMAL LOW (ref 34.7–43.7)
Hemoglobin: 8.2 g/dL — ABNORMAL LOW (ref 11.4–14.8)
MCH: 29.5 pg (ref 25.1–33.5)
MCHC: 31.9 g/dL (ref 31.5–35.8)
MCV: 92.4 fL (ref 78.0–96.0)
MPV: 9.2 fL (ref 8.9–12.5)
Platelet Count: 369 10*3/uL — ABNORMAL HIGH (ref 142–346)
RBC: 2.78 10*6/uL — ABNORMAL LOW (ref 3.90–5.10)
RDW: 17 % — ABNORMAL HIGH (ref 11–15)
WBC: 16.8 10*3/uL — ABNORMAL HIGH (ref 3.10–9.50)
nRBC %: 0.2 /100 WBC — ABNORMAL HIGH (ref <=0.0)

## 2022-08-29 LAB — APTT: PTT: 63 s — ABNORMAL HIGH (ref 27–39)

## 2022-08-29 LAB — CROSSMATCH PRBC, 1 UNIT
Expiration Date: 202409062359
ISBT CODE: 5100
Product Status: TRANSFUSED

## 2022-08-29 LAB — MAGNESIUM: Magnesium: 1.9 mg/dL (ref 1.6–2.6)

## 2022-08-29 MED ORDER — DEXTROSE 5 % IV SOLN
INTRAVENOUS | Status: DC
Start: 2022-08-29 — End: 2022-08-31

## 2022-08-29 MED ORDER — KETOROLAC TROMETHAMINE 30 MG/ML IJ SOLN
15.0000 mg | Freq: Once | INTRAMUSCULAR | Status: AC
Start: 2022-08-29 — End: 2022-08-29
  Administered 2022-08-29: 15 mg via INTRAVENOUS
  Filled 2022-08-29: qty 1

## 2022-08-29 MED ORDER — POTASSIUM CHLORIDE 10 MEQ/100ML IV SOLN (WRAP)
10.0000 meq | INTRAVENOUS | Status: AC
Start: 2022-08-29 — End: 2022-08-29
  Administered 2022-08-29 (×2): 10 meq via INTRAVENOUS
  Filled 2022-08-29 (×2): qty 100

## 2022-08-29 NOTE — Progress Notes (Signed)
[facility] PROGRESS NOTE  [address]      Date Time: 08/29/22 6:38 AM  Patient Name: Shelby Barron  Attending Physician: Shelby Mattocks, MD    CC: follow-up ESRD    Assessment:   [name] MWF via [facility]  Malfunctioning permcath - s/p replacement 08/23/22 and now functioning without issues  Anemia due to CKD - Hgb below goal and ferritin elevated  Deconditioning s/p prolonged hospitalization at [facility]   Abdominal wound s/p debridement and irrigation and remains with wound vac in place   Aspiration PNA    Recommendations:     HD tomorrow and MWF per routine  On ESA - monitor H/H  Hold Sevelamer  On Zosyn  Continue midodrine PRN      Case discussed with: patient      Glenard Haring, MD  [facility]  [id number] (office)      Subjective:   Arouses to voice.  No complaints.     Physical Exam:     Vitals:    08/29/22 0116 08/29/22 0145 08/29/22 0341 08/29/22 0604   BP: 96/59 105/65 108/62    Pulse: 79 84 84    Resp: 16 16 16     Temp:   98.8 F (37.1 C)    TempSrc:   Axillary    SpO2: 98% 97% 94%    Weight:    75.8 kg (167 lb 1.7 oz)   Height:           Intake and Output Summary (Last 24 hours) at Date Time    Intake/Output Summary (Last 24 hours) at 08/29/2022 8469  Last data filed at 08/28/2022 1000  Gross per 24 hour   Intake --   Output 0 ml   Net 0 ml         General: no acute distress  Cardiovascular: S1 and S2, no MRG  Lungs: clear to auscultation anteriorly with diminished respiratory effort  Abdomen: soft, dressing in place  Extremities:No LE edema  RUE with ecchymoses  R TDC    Meds:      Scheduled Meds: PRN Meds:    amiodarone, 200 mg, Oral, Daily  atorvastatin, 40 mg, Oral, Daily  b complex-vitamin c-folic acid, 1 tablet, Oral, Daily  cinacalcet, 30 mg, Oral, QPM  darbepoetin alfa, 0.45 mcg/kg, Subcutaneous, Weekly  megestrol, 400 mg, Oral, BID Meals  metoprolol tartrate, 2.5 mg, Intravenous, 4 times per day  midodrine, 5 mg, Oral, BID Meals  montelukast, 10 mg, Oral,  QHS  oxybutynin XL, 5 mg, Oral, Daily  pantoprazole, 40 mg, Oral, Daily  petrolatum, , Topical, Q12H SCH  piperacillin-tazobactam, 2.25 g, Intravenous, Q12H  sevelamer carbonate, 800 mg, Oral, TID MEALS  umeclidinium-vilanterol, 1 puff, Inhalation, QAM           sodium chloride, , PRN  acetaminophen, 650 mg, Q4H PRN  benzocaine-menthol, 1 lozenge, Q2H PRN  benzonatate, 100 mg, TID PRN  carboxymethylcellulose sodium, 1 drop, TID PRN  dextrose, 15 g of glucose, PRN   Or  dextrose, 12.5 g, PRN   Or  dextrose, 12.5 g, PRN   Or  glucagon (rDNA), 1 mg, PRN  heparin (porcine), 3,000 Units, PRN  melatonin, 3 mg, QHS PRN  morphine, 2 mg, Q4H PRN  naloxone, 0.2 mg, PRN  ondansetron, 4 mg, Q8H PRN  oxyCODONE, 5 mg, Q6H PRN  polyethylene glycol, 17 g, Daily PRN  saline, 2 spray, Q4H PRN  Labs:     Recent Labs   Lab 08/29/22  0344 08/28/22  0341 08/27/22  0346   WBC 16.80* 18.28* 15.50*   Hemoglobin 8.2* 8.3* 10.5*   Hematocrit 25.7* 25.5* 32.6*   Platelet Count 369* 402* 450*     Recent Labs   Lab 08/29/22  0345 08/28/22  0341 08/27/22  0346   Sodium 142 142 142   Potassium 3.3* 3.7 4.2   Chloride 104 104 104   CO2 25 23 22    BUN 27* 17 36*   Creatinine 4.2* 3.4* 5.2*   Calcium 10.2 10.0 9.2   Albumin 2.4* 3.2* 2.0*   Phosphorus 2.9 2.3 3.9   Magnesium 1.9 1.9 1.8   Glucose 72 111* 158*   GFR 9.9* 12.7* 7.6*             Invalid input(s): "LEUKOCYTESUR"        Imaging personally reviewed, including: No results found.        Signed by: Glenard Haring, MD

## 2022-08-29 NOTE — SLP Progress Note (Addendum)
Speech Language Pathology    SLP Treatment Note    Patient: Shelby Barron    MRN#: [id number]   Unit: T4C TELEMETRY  Bed: O536/U440-34    Recommendations:   Solids:  NPO, sips and chips  Liquids: NPO and sips and chips  Meds: IV     Precautions/Compensations: elevate HOB, suction PRN, frequent oral care, alert and awake, upright positioning, small single bites/sips, verbal cues  Supervision: 1:1 supervision      Referrals: none  SLP Frequency Recommended: 3-4x/wk  Plan of care: NPO/alternative feeding, therapeutic PO trials with SLP , precautions and strategy training, and patient/family education  Assessment:       Clinical signs and symptoms of pharyngeal dysphagia, 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 Ongoing lethargy and AMS significantly contributing to oral preparatory behaviors and overall deglutitive function with clinical signs of aspiration with and without PO trials. Swallow prognosis is fair.Instrumental swallow study is not indicated at this time, but once overall clinical responsiveness and status improves, this might be beneficial in light of recent CVA and chest CT findings (not limited to moderate HH), if in alignment with GOC. Given AMS and respiratory status pt is at risk of aspiration. SLP will continue to follow for dysphagia (swallowing) therapy.     Rehabilitation Prognosis: fair due to age, comorbidities, family/community support, and participation    Education:   Risks and benefits of evaluation discussed with patient and RN. Educated on role of speech therapy,  results of VFSS, plan of care, and goals of therapy and in agreement with POC. Educated on recommended diet, oral infection control, risks of aspiration, and s/s of aspiration. Demonstrated minimal understanding.     Subjective:   Interpreter utilized: no, not indicated     Patient is agreeable to  participation in the therapy session. Nursing clears patient for therapy. Patient's medical condition is appropriate for Speech therapy intervention at this time. No family at bedside.     PAIN: no     Behavior/cognition: lethargic    Orientation: Oriented to self    Objective:   Current diet: NPO except ice chips  Position: in bed and upright   Medical equipment in place: telemetry and IV   Respiratory status:  2 L O2 via NC  Precautions: fall, aspiration, and skin and pressure ulcer      IDDSI oral trials:  Ice   Oral: reduced extraction of bolus from spoon      Pharyngeal: overt signs and symptoms of aspiration characterized by   delayed throat clearing with ice chips  delayed coughing with ice chips       Amount: 2 trials     Thin   (Th0)  via Cup Edge Oral: reduced labial seal with anterior spillage     Pharyngeal: overt signs and symptoms of aspiration characterized by   immediate coughing with thin via cup x 1/2 trials        Amount: 1 oz     Puree   (PU4)   Oral: reduced extraction of bolus from spoon  reduced oral acceptance  oral defensiveness  absent oral manipulation of bolus     Pharyngeal:  no swallow response appreciated with puree bolus sitting at tongue blade. Cleared with liquid assist (ice chip bolus)        Amount: 1/2 tsp X 2       Esophageal Phase: unremarkable    Goals:  Goals  Patient will use lingual sweep to clear buccal cavity to reduce residue from oral cavity during p.o. intake: with minimal verbal instruction, in 5 sessions  Patient will demonstrate adequate oropharyngeal phase skills for a: Thin Liquids (IDDSI TN0), Pureed (IDDSI P4), without overt s/s of aspiration 5/5 boluses    Patient left with call bell within reach, all needs met, bed alarm activated, and all questions answered. RN, MD, notified via secure chat of session outcome and patient response including results and recommendations of dysphagia evaluation including but not limited to current functional status, amount of  oral intake, level of supervision for oral intake, medication administration, diet and POC.    Treatment Time: Start Time: 1200 to Stop Time: 1220    Signature: Barbara Cower, MA, CCC-SLP

## 2022-08-29 NOTE — Progress Notes (Addendum)
[facility] PROGRESS NOTE    [facility] TELEMETRY  [address]  [place] [address]  Dept: [phone]  Loc: [phone]    Date Time: 08/29/22 9:23 AM  Patient Name: [facility] Day: 7    Subjective/Cardiac Relevant Events:   Obtunded, does not respond to all questions.  Telemetry-normal sinus rhythm since yesterday 08/28/2022 at 2 PM.  Rare PVC.  Heart rate 80s.    ASSESSMENT:   Patient is a 82 y.o. female with the following relevant diagnoses:    Admitted with nonhealing abdominal ulcer and nonfunctional hemodialysis catheter.  HD catheter was replaced 08/23/2022.  Underwent I&D in OR 08/25/2022   Aspiration PNA 8/8 evening secondary to nausea/vomiting on IV ABX   Strict NPO  Paroxysmal atrial arrhythmias-was in a briefly 08/28/2022 and converted to normal sinus rhythm 08/28/2022 at 2 PM after a few hours of IV amiodarone  Diagnosed June 2024 at [facility] in the setting of gram-negative rod pneumonia and major abdominal surgery  CHA2DS2-VASc 7  Previously not a candidate for [facility] due to complicated intra-op course 06/28/2022 at [facility] with hemorrhage.   Acute blood loss this admission-hemoglobin dropped to 7.  1 unit PRBC 08/26/2022  Recent admission to [facility] 06/28/2022 for cystectomy and complete nephrectomy complicated by intraoperative enterotomy and hemorrhage.  She also underwent bowel resection ultimately on 06/30/2022  History of CVA with right upper extremity paralysis  Encephalopathy-does not respond to verbal interaction and nurse notes she is not responding to pain  Bladder cancer status post cystectomy  History of renal cell carcinoma status post bilateral nephrectomy  End-stage renal disease on hemodialysis  Hypertension  Hyperlipidemia  Anemia  Encephalopathy  Type 2 diabetes  COPD  Echocardiogram June 2024-EF 65%, grade 1 diastolic dysfunction, very mild AS with peak velocity 2.2 m/s  Carotid duplex 07/15/2022-R ICA 50  to 69% stenosis, LICA less than 50% stenosis      RECOMMENDATIONS:     She was on amiodarone drip for just a couple of hours before converting to normal sinus yesterday 08/28/2022 2PM.  Heart rate 80s at this time.  At this time, she is strictly NPO given her aspiration PNA after I&D in the OR. Can administer IV metoprolol 2.5 mg every 6 hrs. can initiate amiodarone 200 mg daily when she is able to tolerate oral medications.  She was previously not a candidate for anticoagulation in June 2024 when she had her diagnosis of atrial fibrillation, this was secondary to complicated intraoperative enterotomy hemorrhage on 06/28/2022.  It seems since then she was initiated on anticoagulation.  Currently on IV heparin.  Continue to monitor hemoglobin closely.  She received 1 unit of PRBCs 08/26/2022.  Hemoglobin is stable today at 8.2.  If hemoglobin drops further, would have to consider stopping anticoagulation.  This would less than ideal since she does have a very elevated CHA2DS2-VASc score.    Supportive care/IV antibiotics as per primary team  Fluid management as per nephrology-currently on hemodialysis  Continue atorvastatin 40 mg daily-restart when she can tolerate oral medications.  I will arrange for outpatient follow-up.  [facility] will sign off.  Please reach out to [place] if any further questions.    Telemetry/Labs Reviewed/Intake-Output:     Telemetry reviewed: As above                Recent Labs   Lab 08/29/22  0345   Albumin 2.4*     Recent  Labs   Lab 08/29/22  0345   Magnesium 1.9     Recent Labs   Lab 08/29/22  0345 08/24/22  0354 08/23/22  1637   PT  --   --  19.8*   INR  --   --  1.7   PTT 63*  More results in Results Review 30   More results in Results Review = values in this interval not displayed.     Recent Labs   Lab 08/29/22  0344 08/28/22  0341 08/27/22  0346   WBC 16.80* 18.28* 15.50*   Hemoglobin 8.2* 8.3* 10.5*   Hematocrit 25.7* 25.5* 32.6*   Platelet Count 369* 402* 450*     Recent Labs   Lab  08/29/22  0345 08/28/22  0341 08/27/22  0346   Sodium 142 142 142   Potassium 3.3* 3.7 4.2   Chloride 104 104 104   CO2 25 23 22    BUN 27* 17 36*   Creatinine 4.2* 3.4* 5.2*   GFR 9.9* 12.7* 7.6*   Glucose 72 111* 158*   Calcium 10.2 10.0 9.2           Invalid input(s): "FREET4"    .  Lab Results   Component Value Date    BNP 696 (H) 05/26/2022          Intake/Output Summary (Last 24 hours) at 08/29/2022 0923  Last data filed at 08/28/2022 1000  Gross per 24 hour   Intake --   Output 0 ml   Net 0 ml       Medications:      Scheduled Meds: PRN Meds:    amiodarone, 200 mg, Oral, Daily  atorvastatin, 40 mg, Oral, Daily  b complex-vitamin c-folic acid, 1 tablet, Oral, Daily  cinacalcet, 30 mg, Oral, QPM  darbepoetin alfa, 0.45 mcg/kg, Subcutaneous, Weekly  megestrol, 400 mg, Oral, BID Meals  metoprolol tartrate, 2.5 mg, Intravenous, 4 times per day  midodrine, 5 mg, Oral, BID Meals  montelukast, 10 mg, Oral, QHS  oxybutynin XL, 5 mg, Oral, Daily  pantoprazole, 40 mg, Oral, Daily  petrolatum, , Topical, Q12H SCH  piperacillin-tazobactam, 2.25 g, Intravenous, Q12H  umeclidinium-vilanterol, 1 puff, Inhalation, QAM        Continuous Infusions:   dextrose 50 mL/hr at 08/29/22 0554    heparin infusion 25,000 units/500 mL (Cardiac/Low Intensity) 12 Units/kg/hr (08/28/22 1012)    sodium chloride, , PRN  acetaminophen, 650 mg, Q4H PRN  benzocaine-menthol, 1 lozenge, Q2H PRN  benzonatate, 100 mg, TID PRN  carboxymethylcellulose sodium, 1 drop, TID PRN  dextrose, 15 g of glucose, PRN   Or  dextrose, 12.5 g, PRN   Or  dextrose, 12.5 g, PRN   Or  glucagon (rDNA), 1 mg, PRN  heparin (porcine), 3,000 Units, PRN  melatonin, 3 mg, QHS PRN  morphine, 2 mg, Q4H PRN  naloxone, 0.2 mg, PRN  ondansetron, 4 mg, Q8H PRN  oxyCODONE, 5 mg, Q6H PRN  polyethylene glycol, 17 g, Daily PRN  saline, 2 spray, Q4H PRN          Physical Exam:     Vitals:    08/29/22 0657 08/29/22 0721 08/29/22 0725 08/29/22 0814   BP: 118/65 109/54 123/73 117/69   Pulse:   86 84 87   Resp:  16 16 16    Temp:  99.1 F (37.3 C) 99 F (37.2 C) 99.5 F (37.5 C)   TempSrc:  Axillary Oral Axillary   SpO2:  93% 99% 96%  Weight:       Height:             08/17/2022 08/18/2022 08/20/2022 08/22/2022 08/24/2022 08/25/2022 08/29/2022   Weight Monitoring   Height   165.1 cm 165.1 cm      Height Method   Estimated       Weight 81.2 kg 81.2 kg 85 kg 82.2 kg    82.2 kg 81.9 kg 81.9 kg 75.8 kg   Weight Method Bed Scale  Estimated Bed Scale Bed Scale Actual Bed Scale   BMI (calculated) 32.7 kg/m2 32.7 kg/m2 31.2 kg/m2 30.2 kg/m2 30 kg/m2 30 kg/m2 27.8 kg/m2       Multiple values from one day are sorted in reverse-chronological order       Constitutional: Alert, not oriented, in no acute distress.  Neck: No carotid bruits, JVP normal.  Cardiac: Regular rate and rhythm, normal S1 and S2; no S3 or S4, no murmurs, no rubs, no gallops.  Pulmonary: Clear to auscultation bilaterally, no wheezing, no rhonchi, no rales.  Extremities: no edema.  Vascular: +2 pulses in radial artery bilaterally, 2+ pedal pulses bilaterally.    -------------------------------------------------------------------------------------  Signed by:         Creta Levin, MD         [facility]    [facility] Contact Information   [facility]  Secure Chat (Group):   [facility] [place] [facility]    APP Spectralink:  [phone]    MD Spectralink :  [phone]  [phone]    After hours, non urgent consult line:  [phone]    After hours, physician on-call:  [phone] [facility]  Secure Chat (Group):   [facility] [place] [facility]    APP Spectralink:  [phone]    MD Spectralink :  [phone]      After hours, non urgent consult line:  [phone]    After hours, physician on-call:  [phone] [facility]  Secure Chat (Group):   [facility]    APP Spectralink:  [phone]    MD Spectralink :  [phone]      After hours, non urgent consult line:  [phone]    After hours, physician on-call:  [phone] [facility]  Secure  Chat (Group):   [facility] [place] [facility]    APP Spectralink:  [phone]    MD Spectralink :  [phone]      After hours, non urgent consult line:  [phone]    After hours, physician on-call:  [phone]       This note was generated by the Dragon speech recognition and may contain errors or omissions not intended by the user. Grammatical errors, random word insertions, deletions, pronoun errors, and incomplete sentences are occasional consequences of this technology due to software limitations. Not all errors are caught or corrected. If there are questions or concerns about the content of this note or information contained within the body of this dictation, they should be addressed directly with the author for clarification.

## 2022-08-29 NOTE — Plan of Care (Signed)
Problem: Inadequate Gas Exchange  Goal: Adequate oxygenation and improved ventilation  Outcome: Progressing  Flowsheets (Taken 08/29/2022 0026)  Adequate oxygenation and improved ventilation:   Assess lung sounds   Monitor SpO2 and treat as needed   Provide mechanical and oxygen support to facilitate gas exchange   Position for maximum ventilatory efficiency   Teach/reinforce use of incentive spirometer 10 times per hour while awake, cough and deep breath as needed   Plan activities to conserve energy: plan rest periods   Increase activity as tolerated/progressive mobility     Problem: Renal Instability  Goal: Fluid and electrolyte balance are achieved/maintained  Outcome: Progressing  Flowsheets (Taken 08/29/2022 0026)  Fluid and electrolyte balance are achieved/maintained:   Monitor/assess lab values and report abnormal values   Assess and reassess fluid and electrolyte status   Observe for cardiac arrhythmias   Monitor for muscle weakness   Follow fluid restrictions/IV/PO parameters     Problem: Compromised Friction/Shear  Goal: Friction and Shear Interventions  Outcome: Progressing  Flowsheets (Taken 08/29/2022 0026)  Friction and Shear Interventions: Pad bony prominences, Off load heels, HOB 30 degrees or less unless contraindicated, Consider: TAP seated positioning, Heel foams     Problem: Compromised Nutrition  Goal: Nutrition Interventions  Outcome: Progressing  Flowsheets (Taken 08/29/2022 0026)  Nutrition Interventions: Discuss nutrition at Rounds, I&Os, Document % meal eaten, Daily weights     Problem: Compromised Activity/Mobility  Goal: Activity/Mobility Interventions  Outcome: Progressing  Flowsheets (Taken 08/29/2022 0026)  Activity/Mobility Interventions: Pad bony prominences, TAP Seated positioning system when OOB, Promote PMP, Reposition q 2 hrs / turn clock, Offload heels     Problem: Compromised Moisture  Goal: Moisture level Interventions  Outcome: Progressing  Flowsheets (Taken 08/29/2022  0026)  Moisture level Interventions: Moisture wicking products, Moisture barrier cream     Problem: Compromised Sensory Perception  Goal: Sensory Perception Interventions  Outcome: Progressing  Flowsheets (Taken 08/29/2022 0026)  Sensory Perception Interventions: Offload heels, Pad bony prominences, Reposition q 2hrs/turn Clock, Q2 hour skin assessment under devices if present

## 2022-08-29 NOTE — Progress Notes (Signed)
[facility]  HOSPITALIST  PROGRESS NOTE      Patient: Shelby Barron  Date: 08/29/2022   LOS: 7 Days  Admission Date: 08/21/2022   MRN: [id number]  Attending:  Shelby Mattocks, MD     ASSESSMENT/PLAN     Shelby Barron is a 82 y.o. female with PMH AF on Eliquis, chronic HFpEF, HTN, HLD, h/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 during complicated admission at [facility], [name], RCC s/p bilateral nephrectomy, urothelial CA s/p cystectomy c/b small bowel enterotomy s/p small bowel anastomosis and abdominal closure requiring wound vac, anemia of chronic disease, COPD, h/o T2DM (last A1c 5.6 in June 2024), dysphagia, gout, admitted with malfunctioning HD catheter with need for HD and non-healing abdominal surgical wound.    Acute Hypoxic Respiratory Failure  Aspiration PNA (acute)  - Acute episode of nausea and vomiting, followed by pt becoming increasingly hypoxemic (08/27/22)  - Currently requiring 2L O2, at max was on 8L O2  - Telemetry monitoring. Continuous pulse ox.  - CXR 8/9 noted, RLL infiltrate  - DDimer 1.97. CT Angio Chest negative for PE, see report below.  - BNP 5,124. D/w Nephro Dr. [name] (08/27/22), will remove more fluid off with HD.  - Continue on IV Zosyn (renal dosing)  - Infectious Disease consulted. Appreciate recommendations. D/w Dr. Cecile Sheerer.  - Strict NPO. SLP consulted.  - Aspiration precautions  - Palliative Care has seen this pt in past admissions, most recently in July 2024. PC notes reviewed, family declined to speak or meet with PC.    Paroxysmal A-Fib  - ECG noted  - Given high aspiration risk, pt unable to safely take po meds   - Discontinue home po Coreg. Started IV Metoprolol 2.5 mg every 6 hours.  - Started on Amiodarone gtt per [facility] recs, received only a couple of hours of gtt prior to converting to SR, then it was discontinued 08/27/12  - Continue home Amiodarone when able to take po meds  - Continue on Heparin gtt, may transition to Eliquis once cleared to take po meds  - If Hg is  trending down to < 8 grams, will need to consider stopping anticoagulation  - Cardiology consulted, [facility], appreciate recommendations. D/w Dr. Iver Nestle.  - Telemetry    Non-healing abdominal surgical wound s/p I&D 08/25/2022 by Dr. Morton Stall bowel enterotomy s/p small bowel anastomosis, abd closure, wound vac  - SIRS present on admission with persistent leukocytosis, tachycardia  - Appreciate Surgery and WOCN consult and recommendations after malfunctioning wound vac removed  - In OR, large mass of necrotic fat overlying pus pocket incised and drained, no cultures  - Received IV Vanco and CTX, discontinued, d/w Dr. Zollie Beckers [facility] 08/27/22, no abx needed   - Currently on Heparin gtt, may transition to Eliquis once cleared to take po meds  - Maintain wound vac    Acute blood loss anemia 2/to abdominal incision and drainage  Anemia of chronic kidney disease, stable  Iron deficiency anemia  Thrombocytosis, suspect reactive  - Hb dropped to 7, was given 1u PRBC 08/26/22 with good response   - No active signs of bleeding  - Iron studies noted  - On Aranesp per Nephro  - Monitor CBC  - If Hg is trending down to < 8 grams, will need to consider stopping anticoagulation    Malfunctioning HD Catheter  [name] MWF  H/o RCC s/p bilateral nephrectomy  Urothelial CA s/p cystectomy  - Appreciate IR  and [facility] consult and recommendations  - S/p Permacath exchange by IR 08/23/22  - Continue MWF HD schedule  - Continue Midodrine for hypotension (hold for SBP>120)    Chronic HFpEF  HTN  HLD  H/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  - Discontinued po Carvedilol (npo status) --> on IV Metoprolol every 6 hours  - Resume home Amiodarone when able to take po meds  - Resume home Atorvastatin when able to take po meds  - Continue on Heparin gtt, may transition to Eliquis when able to take po meds    Deconditioned state s/p prolonged hospitalisation  - PT/OT recommend SNF  - Fall precautions, skin  precautions    Severe Protein Calorie Malnutrition related to inadequate oral intake in the setting of acute illness as evidenced by 9% weight loss x 1 month,  moderate muscle wasting (temporalis, pectoralis, deltoid) and moderate fat loss (orbital, buccal, triceps)   - Appreciate Nutrition consult and recommendations  - Continue Megace BID    Obesity Class I, BMI 30  - Lifestyle modifications impractical for largely bed-bound patient    Providing daily updates to patient's daughter [name] on phone    Analgesia: Tylenol prn     FEN  Fluids: SL  Nutrition: Strict NPO 08/27/22    Monitoring:  Telemetry needed: Yes  Continuous pulse oximetry needed: Yes  Level of Care: 3    Safety Checklist  DVT prophylaxis: Chemical   Foley: Not present   IVs:  Peripheral IV   PT/OT: Not needed   Daily CBC & or Chem ordered: Yes, due to clinical and lab instability       Patient Lines/Drains/Airways Status       Active PICC Line / CVC Line / PIV Line / Drain / Airway / Intraosseous Line / Epidural Line / ART Line / Line / Wound / Pressure Ulcer / NG/OG Tube       Name Placement date Placement time Site Days    Permacath Catheter - Tunneled 07/14/22 Subclavian vein catheter Right 07/14/22  1404  -- 38    Peripheral IV 20 G Standard Anterior;Right Wrist --  --  Wrist  --    Wound 06/30/22 Surgical Incision Abdomen Other (Comment) WOUND VAC applied 06/30/22  1152  Abdomen  52    Wound 07/23/22 Skin Tear Buttocks;Coccyx Inner R and L cheek, coccyx 07/23/22  --  Buttocks;Coccyx  30                       Code Status: full code    DISPO: assisted living facility    Family Contact: As listed in medical chart    Care Plan discussed with nursing, consultants       SUBJECTIVE     Shelby Barron is seen lying in bed, ill appearing, lethargic, non-verbal. On 2L O2, weaning down, seen taking off her O2 multiple times. NPO status.    MEDICATIONS     Current Facility-Administered Medications   Medication Dose Route Frequency    amiodarone  200 mg Oral  Daily    atorvastatin  40 mg Oral Daily    b complex-vitamin c-folic acid  1 tablet Oral Daily    cinacalcet  30 mg Oral QPM    darbepoetin alfa  0.45 mcg/kg Subcutaneous Weekly    megestrol  400 mg Oral BID Meals    metoprolol tartrate  2.5 mg Intravenous 4 times per day    midodrine  5 mg Oral BID Meals    montelukast  10 mg Oral QHS    oxybutynin XL  5 mg Oral Daily    pantoprazole  40 mg Oral Daily    petrolatum   Topical Q12H SCH    piperacillin-tazobactam  2.25 g Intravenous Q12H    umeclidinium-vilanterol  1 puff Inhalation QAM       ROS     All other systems not mentioned in subjective reviewed and negative - unable to obtain    PHYSICAL EXAM     Vitals:    08/29/22 0814   BP: 117/69   Pulse: 87   Resp: 16   Temp: 99.5 F (37.5 C)   SpO2: 96%       Temperature: Temp  Min: 98.6 F (37 C)  Max: 99.5 F (37.5 C)  Pulse: Pulse  Min: 79  Max: 104  Respiratory: Resp  Min: 14  Max: 18  Non-Invasive BP: BP  Min: 96/59  Max: 123/73  Pulse Oximetry SpO2  Min: 93 %  Max: 99 %    Intake and Output Summary (Last 24 hours) at Date Time    Intake/Output Summary (Last 24 hours) at 08/29/2022 4696  Last data filed at 08/28/2022 1000  Gross per 24 hour   Intake --   Output 0 ml   Net 0 ml       GEN APPEARANCE: Elderly, ill appearing, lethargic, non-verbal  HEENT: NC/AT; Conjunctiva Clear, Sclerae Anicteric; MM dry  NECK: Supple  CVS: Tachycardic, Irreg, Irreg, S1, S2; No Murmur  LUNGS: Bibasilar Rales, coarse breath sounds  ABD: Soft; ND; generalized TTP; Large non-healing abdominal surgical wound/incision with wound vac in place post-operatively  EXT: No LE edema  SKIN: No rash or lesions  NEURO: Nonverbal and not following commands on exam; Unable to access, limited exam    LABS     Recent Labs   Lab 08/29/22  0344 08/28/22  0341 08/27/22  0346   WBC 16.80* 18.28* 15.50*   RBC 2.78* 2.80* 3.55*   Hemoglobin 8.2* 8.3* 10.5*   Hematocrit 25.7* 25.5* 32.6*   MCV 92.4 91.1 91.8   Platelet Count 369* 402* 450*       Recent Labs    Lab 08/29/22  0345 08/28/22  0341 08/27/22  0346 08/26/22  2320 08/26/22  0407 08/25/22  0416   Sodium 142 142 142 143 142 142   Potassium 3.3* 3.7 4.2 4.4 4.4 4.8   Chloride 104 104 104 104 106 104   CO2 25 23 22 21 23 22    BUN 27* 17 36* 33* 28* 46*   Creatinine 4.2* 3.4* 5.2* 5.2* 4.5* 5.8*   Glucose 72 111* 158* 150* 117* 124*   Calcium 10.2 10.0 9.2 9.2 8.6 8.1   Magnesium 1.9 1.9 1.8  --  1.9 1.9       Recent Labs   Lab 08/29/22  0345 08/28/22  0341 08/27/22  0346   Albumin 2.4* 3.2* 2.0*       Recent Labs   Lab 08/29/22  0345 08/28/22  0341 08/27/22  1148 08/24/22  0354 08/23/22  1637   INR  --   --   --   --  1.7   PT  --   --   --   --  19.8*   PTT 63* 59* 60*  More results in Results Review 30   More results in Results Review = values in this interval not displayed.  RADIOLOGY     Radiological Procedure personally reviewed and concur with radiologist reports unless stated otherwise.    CT Angiogram Chest   Final Result         1. No pulmonary embolism.   2. Emphysema. Bibasilar lung consolidation.   3. Coronary calcification. Mild cardiomegaly.   4. Moderate hiatal hernia.   5. Ascites. Left posterior lower chest wall/abdominal wall hernia noted.      Nelta Numbers, MD   08/27/2022 6:56 PM      XR Chest AP Portable   Final Result         1. Increasing airspace opacity in the right lower lobe.   2. Stable pulmonary edema pattern.      Colonel Bald, MD   08/27/2022 6:47 AM      XR Chest AP Portable   Final Result      Mild interstitial edema.      Shelly Flatten, MD   08/26/2022 6:19 PM      Tunneled Cath Check/Change (Permcath)   Final Result      Right-sided PermCath exchange with new 14.5 French 19 cm PermCath with tip   in the right atrium.      PLAN:    Catheter ready for immediate use.      Darci Current, MD   08/23/2022 12:49 PM      XR Chest  AP Portable   Final Result      No significant change mild pulmonary vascular congestion and mild basal   atelectasis. Hiatal hernia.      Clide Cliff, MD    08/21/2022 2:35 PM          Signed,  [name], PA-C  9:23 AM 08/29/2022

## 2022-08-29 NOTE — Plan of Care (Signed)
Comment: Patient on continuous pulse ox - removing oxygen via NC at times - oxygen saturation 89-91% on room air. Patient on heparin gtt. Patient has wound vac - no drainage noted - suction intact. Patient Q2 turns - wedges used - tolerating well.    Problem: Hemodynamic Status: Cardiac  Goal: Stable vital signs and fluid balance  Outcome: Progressing  Flowsheets (Taken 08/29/2022 1842)  Stable vital signs and fluid balance:   Assess signs and symptoms associated with cardiac rhythm changes   Monitor lab values     Problem: Inadequate Gas Exchange  Goal: Adequate oxygenation and improved ventilation  Outcome: Progressing  Flowsheets (Taken 08/29/2022 1842)  Adequate oxygenation and improved ventilation:   Assess lung sounds   Monitor SpO2 and treat as needed   Provide mechanical and oxygen support to facilitate gas exchange   Position for maximum ventilatory efficiency   Consult/collaborate with Respiratory Therapy     Problem: Renal Instability  Goal: Fluid and electrolyte balance are achieved/maintained  Outcome: Progressing  Flowsheets (Taken 08/29/2022 1842)  Fluid and electrolyte balance are achieved/maintained:   Monitor/assess lab values and report abnormal values   Assess and reassess fluid and electrolyte status   Observe for cardiac arrhythmias   Monitor for muscle weakness   Follow fluid restrictions/IV/PO parameters

## 2022-08-29 NOTE — Progress Notes (Signed)
Unable to obtain patient's signature as patient is not able to participate. Telephonic acknowledgement received from daughter Antonette Karwacki, [phone] and copy of the letter was left at patient's bedside.    Belva Agee, CMS

## 2022-08-30 ENCOUNTER — Encounter (INDEPENDENT_AMBULATORY_CARE_PROVIDER_SITE_OTHER): Payer: Self-pay | Admitting: Cardiovascular Disease

## 2022-08-30 LAB — APTT
PTT: 76 s — ABNORMAL HIGH (ref 27–39)
PTT: 54 s — ABNORMAL HIGH (ref 27–39)
PTT: 54 s — ABNORMAL HIGH (ref 27–39)

## 2022-08-30 LAB — CBC
Absolute nRBC: 0.13 10*3/uL — ABNORMAL HIGH (ref <=0.00)
Hematocrit: 28.3 % — ABNORMAL LOW (ref 34.7–43.7)
Hemoglobin: 9.2 g/dL — ABNORMAL LOW (ref 11.4–14.8)
MCH: 29.5 pg (ref 25.1–33.5)
MCHC: 32.5 g/dL (ref 31.5–35.8)
MCV: 90.7 fL (ref 78.0–96.0)
MPV: 8.7 fL — ABNORMAL LOW (ref 8.9–12.5)
Platelet Count: 337 10*3/uL (ref 142–346)
RBC: 3.12 10*6/uL — ABNORMAL LOW (ref 3.90–5.10)
RDW: 17 % — ABNORMAL HIGH (ref 11–15)
WBC: 22.12 10*3/uL — ABNORMAL HIGH (ref 3.10–9.50)
nRBC %: 0.6 /100 WBC — ABNORMAL HIGH (ref <=0.0)

## 2022-08-30 LAB — BASIC METABOLIC PANEL
Anion Gap: 13 (ref 5.0–15.0)
BUN: 36 mg/dL — ABNORMAL HIGH (ref 7–21)
CO2: 23 mEq/L (ref 17–29)
Calcium: 9.5 mg/dL (ref 7.9–10.2)
Chloride: 103 mEq/L (ref 99–111)
Creatinine: 4.9 mg/dL — ABNORMAL HIGH (ref 0.4–1.0)
GFR: 8.2 mL/min/{1.73_m2} — ABNORMAL LOW (ref >=60.0)
Glucose: 93 mg/dL (ref 70–100)
Potassium: 3.9 mEq/L (ref 3.5–5.3)
Sodium: 139 mEq/L (ref 135–145)

## 2022-08-30 LAB — WHOLE BLOOD GLUCOSE POCT: Whole Blood Glucose POCT: 88 mg/dL (ref 70–100)

## 2022-08-30 LAB — ECG 12-LEAD
IHS MUSE NARRATIVE AND IMPRESSION: NORMAL
Ventricular Rate: 86 {beats}/min

## 2022-08-30 LAB — MAGNESIUM: Magnesium: 1.9 mg/dL (ref 1.6–2.6)

## 2022-08-30 MED ORDER — ALBUMIN HUMAN 25 % IV SOLN
100.0000 mL | INTRAVENOUS | Status: AC | PRN
Start: 2022-08-30 — End: 2022-08-30
  Administered 2022-08-30 (×2): 25 g via INTRAVENOUS
  Filled 2022-08-30 (×2): qty 100

## 2022-08-30 MED ORDER — SODIUM CHLORIDE 0.9 % IV BOLUS
250.0000 mL | INTRAVENOUS | Status: AC | PRN
Start: 2022-08-30 — End: 2022-08-30

## 2022-08-30 MED ORDER — SODIUM CHLORIDE 0.9 % IV BOLUS
100.0000 mL | INTRAVENOUS | Status: AC | PRN
Start: 2022-08-30 — End: 2022-08-30

## 2022-08-30 NOTE — Progress Notes (Signed)
[facility] PROGRESS NOTE  [id number]      Date Time: 08/30/22 9:10 AM  Patient Name: Shelby Barron  Attending Physician: Shelby Mattocks, MD    CC: follow-up ESRD    Assessment:   [name] MWF via [facility]  Malfunctioning permcath - s/p replacement 08/23/22 and now functioning without issues  Anemia due to CKD - Hgb below goal and ferritin elevated  Deconditioning s/p prolonged hospitalization at [facility]   Abdominal wound s/p debridement and irrigation and remains with wound vac in place   Aspiration PNA - on abx, worsening leucocytosis    Recommendations:     HD today as per usual schedule  On ESA - monitor H/H  Hold Sevelamer - check phos with labs tomorrow  On Zosyn  Continue midodrine PRN      Case discussed with: patient      Joetta Manners, MD  [facility]  [phone] (office)      Subjective:   Remains on 2L NC    Seen on HD  Dialysis Treatment Data:  Blood flow rate: 350 mL/min  Dialysate flow rate: 700 mL/min  Ultrafiltration rate: 880 mL/hr    Dialysate potassium: 3 mEq/L  Dialysate calcium: 2.5 mEq/L    Arterial pressure: -151 mmHg  Venous pressure:    Alert but altered, does not respond to questions.    Physical Exam:     Vitals:    08/30/22 0610 08/30/22 0640 08/30/22 0759 08/30/22 0900   BP: 116/72 103/57 100/59 101/47   Pulse:   79 78   Resp:   18 18   Temp:   98.1 F (36.7 C)    TempSrc:   Axillary    SpO2:   94% 96%   Weight:       Height:           Intake and Output Summary (Last 24 hours) at Date Time  No intake or output data in the 24 hours ending 08/30/22 0910        General: no acute distress  Cardiovascular: S1 and S2, no MRG  Lungs: clear to auscultation anteriorly with diminished respiratory effort  Abdomen: soft, dressing in place  Extremities:No LE edema  RUE with ecchymoses  R TDC    Meds:      Scheduled Meds: PRN Meds:    amiodarone, 200 mg, Oral, Daily  atorvastatin, 40 mg, Oral, Daily  b complex-vitamin c-folic acid, 1 tablet, Oral,  Daily  cinacalcet, 30 mg, Oral, QPM  darbepoetin alfa, 0.45 mcg/kg, Subcutaneous, Weekly  megestrol, 400 mg, Oral, BID Meals  metoprolol tartrate, 2.5 mg, Intravenous, 4 times per day  midodrine, 5 mg, Oral, BID Meals  montelukast, 10 mg, Oral, QHS  oxybutynin XL, 5 mg, Oral, Daily  pantoprazole, 40 mg, Oral, Daily  petrolatum, , Topical, Q12H SCH  piperacillin-tazobactam, 2.25 g, Intravenous, Q12H  umeclidinium-vilanterol, 1 puff, Inhalation, QAM           sodium chloride, , PRN  acetaminophen, 650 mg, Q4H PRN  albumin human, 100 mL, PRN  benzocaine-menthol, 1 lozenge, Q2H PRN  benzonatate, 100 mg, TID PRN  carboxymethylcellulose sodium, 1 drop, TID PRN  dextrose, 15 g of glucose, PRN   Or  dextrose, 12.5 g, PRN   Or  dextrose, 12.5 g, PRN   Or  glucagon (rDNA), 1 mg, PRN  heparin (porcine), 3,000 Units, PRN  melatonin, 3 mg, QHS PRN  morphine, 2 mg, Q4H PRN  naloxone, 0.2 mg, PRN  ondansetron, 4 mg, Q8H PRN  oxyCODONE, 5 mg, Q6H PRN  polyethylene glycol, 17 g, Daily PRN  saline, 2 spray, Q4H PRN  sodium chloride, 100 mL, Q1H PRN  sodium chloride, 250 mL, PRN              Labs:     Recent Labs   Lab 08/30/22  0549 08/29/22  0344 08/28/22  0341   WBC 22.12* 16.80* 18.28*   Hemoglobin 9.2* 8.2* 8.3*   Hematocrit 28.3* 25.7* 25.5*   Platelet Count 337 369* 402*     Recent Labs   Lab 08/30/22  0549 08/29/22  0345 08/28/22  0341 08/27/22  0346   Sodium 139 142 142 142   Potassium 3.9 3.3* 3.7 4.2   Chloride 103 104 104 104   CO2 23 25 23 22    BUN 36* 27* 17 36*   Creatinine 4.9* 4.2* 3.4* 5.2*   Calcium 9.5 10.2 10.0 9.2   Albumin  --  2.4* 3.2* 2.0*   Phosphorus  --  2.9 2.3 3.9   Magnesium 1.9 1.9 1.9 1.8   Glucose 93 72 111* 158*   GFR 8.2* 9.9* 12.7* 7.6*             Invalid input(s): "LEUKOCYTESUR"        Imaging personally reviewed, including: No results found.        Signed by: Joetta Manners, MD

## 2022-08-30 NOTE — Progress Notes (Signed)
[facility]  HOSPITALIST  PROGRESS NOTE      Patient: Shelby Barron  Date: 08/30/2022   LOS: 8 Days  Admission Date: 08/21/2022   MRN: [id number]  Attending:  Shelby Mattocks, MD     ASSESSMENT/PLAN     Shelby Barron is a 82 y.o. female with PMH AF on Eliquis, chronic HFpEF, HTN, HLD, h/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 during complicated admission at [facility], [name], RCC s/p bilateral nephrectomy, urothelial CA s/p cystectomy c/b small bowel enterotomy s/p small bowel anastomosis and abdominal closure requiring wound vac, anemia of chronic disease, COPD, h/o T2DM (last A1c 5.6 in June 2024), dysphagia, gout, admitted with malfunctioning HD catheter with need for HD and non-healing abdominal surgical wound.    Acute Hypoxic Respiratory Failure  Aspiration PNA (acute)  Volume overload  Acute Pulmonary edema   - Acute episode of nausea and vomiting, followed by pt becoming increasingly hypoxemic (08/27/22)  - Currently requiring 2L O2, at max was on 8L O2  - Telemetry monitoring. Continuous pulse ox.  - CXR 8/9 noted, RLL infiltrate  - DDimer 1.97. CT Angio Chest negative for PE, see report below.  - BNP 5,124. D/w Nephro Dr. [name] (08/27/22), will remove more fluid off with HD.  - Continue on IV Zosyn (renal dosing)  - Infectious Disease consulted. Appreciate recommendations. D/w Dr. Cecile Sheerer.  - Strict NPO. SLP following   - Aspiration precautions  - Palliative Care has seen this pt in past admissions, most recently in July 2024. PC notes reviewed, family declined to speak or meet with PC.  - Patient continues to fail swallow eval. Will need to discuss long term nutrition with daughter     Paroxysmal A-Fib  - ECG noted  - Given high aspiration risk, pt unable to safely take po meds   - Discontinued home po Coreg. Started IV Metoprolol 2.5 mg every 6 hours.  - Started on Amiodarone gtt per [facility] recs, received only a couple of hours of gtt prior to converting to SR, then it was discontinued 08/27/12  - Continue  home Amiodarone when able to take po meds  - Continue on Heparin gtt, may transition to Eliquis once cleared to take po meds  - If Hg is trending down to < 8 grams, will need to consider stopping anticoagulation  - Cardiology consulted, [facility], appreciate recommendations.  - Telemetry    Non-healing abdominal surgical wound s/p I&D 08/25/2022 by Dr. Morton Stall bowel enterotomy s/p small bowel anastomosis, abd closure, wound vac  - SIRS present on admission with persistent leukocytosis, tachycardia  - Appreciate Surgery and WOCN consult and recommendations after malfunctioning wound vac removed  - In OR, large mass of necrotic fat overlying pus pocket incised and drained, no cultures  - Received IV Vanco and CTX, discontinued, d/w Dr. Zollie Beckers VSA 08/27/22, no abx needed   - Currently on Heparin gtt, may transition to Eliquis once cleared to take po meds  - Maintain wound vac    Acute blood loss anemia 2/to abdominal incision and drainage  Anemia of chronic kidney disease, stable  Iron deficiency anemia  Thrombocytosis, suspect reactive  - Hb dropped to 7, was given 1u PRBC 08/26/22 with good response   - No active signs of bleeding  - Iron studies noted  - On Aranesp per Nephro  - Monitor CBC  - If Hg is trending down to < 8 grams, will need to consider stopping anticoagulation  Malfunctioning HD Catheter  [name] MWF  H/o RCC s/p bilateral nephrectomy  Urothelial CA s/p cystectomy  - Appreciate IR and [facility] consult and recommendations  - S/p Permacath exchange by IR 08/23/22  - Continue MWF HD schedule  - Continue Midodrine for hypotension (hold for SBP>120)    Chronic HFpEF  HTN  HLD  H/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  - Discontinued po Carvedilol (npo status) --> on IV Metoprolol every 6 hours  - Resume home Amiodarone when able to take po meds  - Resume home Atorvastatin when able to take po meds  - Continue on Heparin gtt, may transition to Eliquis when able to take po  meds    Deconditioned state s/p prolonged hospitalisation  - PT/OT recommend SNF. Patient likely will need LTAC placement given multiple issues and frequent nursing care including frequent suctioning of secretions   - Fall precautions, skin precautions    Severe Protein Calorie Malnutrition related to inadequate oral intake in the setting of acute illness as evidenced by 9% weight loss x 1 month,  moderate muscle wasting (temporalis, pectoralis, deltoid) and moderate fat loss (orbital, buccal, triceps)   - Appreciate Nutrition consult and recommendations  - will need to discuss long term nutrition with patient's daughter    Obesity Class I, BMI 30  - Lifestyle modifications impractical for largely bed-bound patient    Providing daily updates to patient's daughter [name] on phone    Analgesia: Tylenol prn     FEN  Fluids: SL  Nutrition: Strict NPO 08/27/22    Monitoring:  Telemetry needed: Yes  Continuous pulse oximetry needed: Yes  Level of Care: 3    Safety Checklist  DVT prophylaxis: Chemical   Foley: Not present   IVs:  Peripheral IV   PT/OT: Not needed   Daily CBC & or Chem ordered: Yes, due to clinical and lab instability       Patient Lines/Drains/Airways Status       Active PICC Line / CVC Line / PIV Line / Drain / Airway / Intraosseous Line / Epidural Line / ART Line / Line / Wound / Pressure Ulcer / NG/OG Tube       Name Placement date Placement time Site Days    Permacath Catheter - Tunneled 07/14/22 Subclavian vein catheter Right 07/14/22  1404  -- 38    Peripheral IV 20 G Standard Anterior;Right Wrist --  --  Wrist  --    Wound 06/30/22 Surgical Incision Abdomen Other (Comment) WOUND VAC applied 06/30/22  1152  Abdomen  52    Wound 07/23/22 Skin Tear Buttocks;Coccyx Inner R and L cheek, coccyx 07/23/22  --  Buttocks;Coccyx  30                       Code Status: full code    DISPO: likely will need LTAC on discharge    Family Contact: daughter     Care Plan discussed with nursing, consultants        SUBJECTIVE     Shelby Barron is seen lying in bed, lethargic but opens eyes to voice. Ill appearing. Patient's mentation remains unchanged overnight     MEDICATIONS     Current Facility-Administered Medications   Medication Dose Route Frequency    amiodarone  200 mg Oral Daily    atorvastatin  40 mg Oral Daily    b complex-vitamin c-folic acid  1 tablet Oral Daily  cinacalcet  30 mg Oral QPM    darbepoetin alfa  0.45 mcg/kg Subcutaneous Weekly    megestrol  400 mg Oral BID Meals    metoprolol tartrate  2.5 mg Intravenous 4 times per day    midodrine  5 mg Oral BID Meals    montelukast  10 mg Oral QHS    oxybutynin XL  5 mg Oral Daily    pantoprazole  40 mg Oral Daily    petrolatum   Topical Q12H SCH    piperacillin-tazobactam  2.25 g Intravenous Q12H    umeclidinium-vilanterol  1 puff Inhalation QAM       ROS     All other systems not mentioned in subjective reviewed and negative - unable to obtain    PHYSICAL EXAM     Vitals:    08/30/22 1548   BP: 113/56   Pulse: 84   Resp: 19   Temp: 98.6 F (37 C)   SpO2: 93%       Temperature: Temp  Min: 97.9 F (36.6 C)  Max: 98.8 F (37.1 C)  Pulse: Pulse  Min: 75  Max: 94  Respiratory: Resp  Min: 16  Max: 21  Non-Invasive BP: BP  Min: 79/64  Max: 161/67  Pulse Oximetry SpO2  Min: 92 %  Max: 97 %    Intake and Output Summary (Last 24 hours) at Date Time    Intake/Output Summary (Last 24 hours) at 08/30/2022 1632  Last data filed at 08/30/2022 1258  Gross per 24 hour   Intake --   Output 2300 ml   Net -2300 ml       GEN APPEARANCE: Elderly, ill appearing, lethargic, non-verbal  HEENT: NC/AT; Conjunctiva Clear, Sclerae Anicteric; MM dry. Has secretions   NECK: Supple  CVS: Tachycardic, Irreg, Irreg, S1, S2; No Murmur  LUNGS: Bibasilar Rales, coarse breath sounds  ABD: Soft; ND; generalized TTP; Large non-healing abdominal surgical wound/incision with wound vac in place post-operatively  EXT: No LE edema  SKIN: No rash or lesions  NEURO: Nonverbal and not following  commands on exam; Unable to access, limited exam    LABS     Recent Labs   Lab 08/30/22  0549 08/29/22  0344 08/28/22  0341   WBC 22.12* 16.80* 18.28*   RBC 3.12* 2.78* 2.80*   Hemoglobin 9.2* 8.2* 8.3*   Hematocrit 28.3* 25.7* 25.5*   MCV 90.7 92.4 91.1   Platelet Count 337 369* 402*       Recent Labs   Lab 08/30/22  0549 08/29/22  0345 08/28/22  0341 08/27/22  0346 08/26/22  2320 08/26/22  0407   Sodium 139 142 142 142 143 142   Potassium 3.9 3.3* 3.7 4.2 4.4 4.4   Chloride 103 104 104 104 104 106   CO2 23 25 23 22 21 23    BUN 36* 27* 17 36* 33* 28*   Creatinine 4.9* 4.2* 3.4* 5.2* 5.2* 4.5*   Glucose 93 72 111* 158* 150* 117*   Calcium 9.5 10.2 10.0 9.2 9.2 8.6   Magnesium 1.9 1.9 1.9 1.8  --  1.9       Recent Labs   Lab 08/29/22  0345 08/28/22  0341 08/27/22  0346   Albumin 2.4* 3.2* 2.0*       Recent Labs   Lab 08/30/22  1540 08/30/22  0549 08/29/22  0345 08/24/22  0354 08/23/22  1637   INR  --   --   --   --  1.7   PT  --   --   --   --  19.8*   PTT 54* 76* 63*  More results in Results Review 30   More results in Results Review = values in this interval not displayed.     RADIOLOGY     Radiological Procedure personally reviewed and concur with radiologist reports unless stated otherwise.    CT Angiogram Chest   Final Result         1. No pulmonary embolism.   2. Emphysema. Bibasilar lung consolidation.   3. Coronary calcification. Mild cardiomegaly.   4. Moderate hiatal hernia.   5. Ascites. Left posterior lower chest wall/abdominal wall hernia noted.      Nelta Numbers, MD   08/27/2022 6:56 PM      XR Chest AP Portable   Final Result         1. Increasing airspace opacity in the right lower lobe.   2. Stable pulmonary edema pattern.      Colonel Bald, MD   08/27/2022 6:47 AM      XR Chest AP Portable   Final Result      Mild interstitial edema.      Shelly Flatten, MD   08/26/2022 6:19 PM      Tunneled Cath Check/Change (Permcath)   Final Result      Right-sided PermCath exchange with new 14.5 French 19 cm  PermCath with tip   in the right atrium.      PLAN:    Catheter ready for immediate use.      Darci Current, MD   08/23/2022 12:49 PM      XR Chest  AP Portable   Final Result      No significant change mild pulmonary vascular congestion and mild basal   atelectasis. Hiatal hernia.      Clide Cliff, MD   08/21/2022 2:35 PM          Signed,  Andi Hence, MD  4:32 PM 08/30/2022

## 2022-08-30 NOTE — Progress Notes (Signed)
INFECTIOUS DISEASE PROGRESS NOTE       Antibiotics:    Zosyn #4  S/p cefazolin, ceftriaxone, vancomycin    Lines:    PIV  Right chest Permacath    Assessment/Plan:    82 y.o. female with DM, ESRD-DD, h/o CVA, RCC s/p left nephrectomy, recently-diagnosed bladder ca s/p radical cystectomy   Recent prolonged hospitalization at [facility]    Admitted from SNF with malfunctioning Permacath  Noted dehiscence of abdominal wound; s/p I&D 08/25/22 with some necrosis/purulence seen  Episode of nausea/vomiting and hypoxic respiratory failure 08/26/22, likely aspiration pneumonia  Persistent leukocytosis; occasional low-grade temps only    Aspiration pneumonia - improved hypoxia, though with increased leukocytosis. Continue Zosyn for now. Check MRSA screen. Aspiration precautions.    Abdominal wound - no antibiotics needed per Surgery; continue local wound care[place]    [place] ([id number])  [facility]  [phone]    Subjective:    No acute events. Minimally conversant; mostly mumbling/moaning. Denies pain/cough/nausea. Afebrile. On HD when seen.    Physical Exam:      Vitals:    08/30/22 0920   BP: 94/48   Pulse: 75   Resp: 18   Temp:    SpO2: 97%     Vitals reviewed. Afebrile  General: awake, chronically ill-appearing elderly female, in NAD  Heart: RRR  Lungs: diminished bilat.  Abdomen: soft, nontender, VAC dressing in place  Extremities: no edema  Lines: PIV, right permacath    Labs:    Lab results reviewed.  Recent Labs     08/30/22  0549   WBC 22.12*   Hemoglobin 9.2*   Hematocrit 28.3*   Platelet Count 337     Recent CMP   Recent Labs     08/30/22  0549   Glucose 93   BUN 36*   Creatinine 4.9*   Sodium 139   Potassium 3.9   Chloride 103   CO2 23       Microbiology:    All microbiology results reviewed.         Radiology:    All radiological procedures reviewed.   Radiology Results (24 Hour)       ** No results found for the last 24 hours. **                Jillene Bucks, MD ([id number])   [facility]   [phone]

## 2022-08-30 NOTE — Progress Notes (Signed)
Patient tolerated HD well, Net fluid removal of 2300 cc. CVC site is secured with TEGO connectors and CUROS caps. D/c no other complaints. VSS.                   08/30/22 1258   Treatment Summary   Time Off Machine 1255   Duration of Treatment (Hours) 3.30   Treatment Type 1:1   Dialyzer Clearance Moderately streaked   Fluid Volume Off (mL) 3100   Prime Volume (mL) 200   Rinseback Volume (mL) 200   Fluid Given: Normal Saline (mL) 200   Fluid Given: PRBC  0 mL   Fluid Given: Albumin (mL) 200   Fluid Given: Other (mL) 0   Total Fluid Given 800   Hemodialysis Net Fluid Removed 2300   Permacath Catheter - Tunneled 08/23/22 Subclavian vein catheter Right   Placement Date/Time: (c) 08/23/22 1047   Inserted by: Dr. Bonita Quin  Access Type: Subclavian vein catheter  Orientation: Right  Central Line Infection Prevention Education provided?: Yes  Hand Hygiene: Chlorhexidine;Alcohol based hand scrub;Soap and water  ...   Line necessity reviewed? Apheresis/hemodialysis   Site Assessment Clean;Dry;Intact   Catheter Lumen Volume Venous 1700 mL   Catheter Lumen Volume Arterial 1600 mL   Dressing Status and Intervention Dressing Intact   Tego/Curos Caps on Catheter Yes   End Caps Free From Blood Yes   Line Care Connections checked and tightened   Dressing Type Transparent;Biopatch   Dressing Status Clean;Dry;Intact   Dressing/Line Intervention Caps changed   Dressing Change Due 09/01/22   Vitals   Temp 97.9 F (36.6 C)   Heart Rate 94   Resp Rate 18   BP 161/67   SpO2 94 %   O2 Device Nasal cannula   Assessment   Mental Status Alert;Oriented;Cooperative   Cardiac (WDL) WDL   Cardiac Regularity Return to [facility]   Respiratory  WDL   Bilateral Breath Sounds Diminished   R Breath Sounds Diminished   Mobility Bed   Pain Assessment   Charting Type Reassessment   Pain Scale Used Numeric Scale (0-10)   Numeric Pain Scale   Pain Score 0   Education   Person taught Patient   Knowledge basis None   Topics taught Access care;S&S of infection;Fluid  Management;K+;Procedure   Teaching Tools Explain;Demo   Reponse No Evidence of Learning   Bedside Nurse Communication   Name of bedside RN - post dialysis Njoroge Violet RN

## 2022-08-30 NOTE — Progress Notes (Addendum)
LOS # 8      Summary of Discharge Plan: LTAC vs. SNF      Identified Possible Discharge Barriers: TBD      CM Interventions and Outcome: CM sent referral for LTAC through careport based on MDR today. CM sent message to Mary(liaison) to review the case. CM also called dtr to update d/c plan. Pending LTAC response.      Discussed above Discharge Plan with (patient, family, Care Team, others): MD, RN, Dtr      Case Management will continue to follow for [place] needs that may arise.       Willow Ora BSN. RN. RRT  Care manager I  [facility]  [phone]

## 2022-08-30 NOTE — Plan of Care (Signed)
Hemodialysis initiated to correct and control fluid and electrolyte imbalance        Problem: Renal Instability  Goal: Fluid and electrolyte balance are achieved/maintained  Flowsheets (Taken 08/30/2022 0915)  Fluid and electrolyte balance are achieved/maintained:   Monitor/assess lab values and report abnormal values   Assess and reassess fluid and electrolyte status   Monitor for muscle weakness   Observe for cardiac arrhythmias   Follow fluid restrictions/IV/PO parameters

## 2022-08-30 NOTE — UM Notes (Signed)
UTILIZATION REVIEW NOTE:    PATIENT:  Shelby Barron  DOB:          April 15, 1940    CONTINUED STAY REVIEW:       --------08/30/22----------   Vitals:    08/30/22 1258 08/30/22 1548 08/30/22 1946 08/30/22 2230   BP: 161/67 113/56 120/57 116/64   Pulse: 94 84 80 76   Resp: 18 19 17     Temp: 97.9 F (36.6 C) 98.6 F (37 C) 99 F (37.2 C)    TempSrc:  Oral Oral    SpO2: 94% 93% 98% 98%   Weight:       Height:             RECENT LABS:  Results       Procedure Component Value Units Date/Time    APTT [[id number]]  (Abnormal) Collected: 08/30/22 2038    Specimen: Blood, Venous Updated: 08/30/22 2114     PTT 54 sec     APTT [[id number]]  (Abnormal) Collected: 08/30/22 1540    Specimen: Blood, Venous Updated: 08/30/22 1621     PTT 54 sec     Culture, Methicillin Resistant Staphylococcus aureus (MRSA) [[id number]] Collected: 08/30/22 1441    Specimen: Swab from Throat Updated: 08/30/22 1447    Culture, Methicillin Resistant Staphylococcus aureus (MRSA) [[id number]] Collected: 08/30/22 1441    Specimen: Swab from Nares Updated: 08/30/22 1447    Magnesium [[id number]]  (Normal) Collected: 08/30/22 0549    Specimen: Blood, Venous Updated: 08/30/22 0635     Magnesium 1.9 mg/dL     Basic Metabolic Panel [[id number]]  (Abnormal) Collected: 08/30/22 0549    Specimen: Blood, Venous Updated: 08/30/22 0635     Glucose 93 mg/dL      BUN 36 mg/dL      Creatinine 4.9 mg/dL      Calcium 9.5 mg/dL      Sodium 536 mEq/L      Potassium 3.9 mEq/L      Chloride 103 mEq/L      CO2 23 mEq/L      Anion Gap 13.0     GFR 8.2 mL/min/1.73 m2     APTT [[id number]]  (Abnormal) Collected: 08/30/22 0549    Specimen: Blood, Venous Updated: 08/30/22 0611     PTT 76 sec     CBC without Differential [[id number]]  (Abnormal) Collected: 08/30/22 0549    Specimen: Blood, Venous Updated: 08/30/22 0556     WBC 22.12 x10 3/uL      Hemoglobin 9.2 g/dL      Hematocrit 43.3 %      Platelet Count 337 x10 3/uL      MPV 8.7 fL      RBC 3.12 x10 6/uL      MCV 90.7 fL      MCH  29.5 pg      MCHC 32.5 g/dL      RDW 17 %      nRBC % 0.6 /100 WBC      Absolute nRBC 0.13 x10 3/uL     Whole Blood Glucose POCT [[id number]]  (Normal) Collected: 08/30/22 0420    Specimen: Blood, Capillary Updated: 08/30/22 0423     Whole Blood Glucose POCT 88 mg/dL               No results found.         MEDICATIONS:  Current Facility-Administered Medications   Medication Dose Route Frequency    amiodarone  200 mg Oral Daily    atorvastatin  40 mg Oral Daily    b complex-vitamin c-folic acid  1 tablet Oral Daily    cinacalcet  30 mg Oral QPM    darbepoetin alfa  0.45 mcg/kg Subcutaneous Weekly    megestrol  400 mg Oral BID Meals    metoprolol tartrate  2.5 mg Intravenous 4 times per day    midodrine  5 mg Oral BID Meals    montelukast  10 mg Oral QHS    oxybutynin XL  5 mg Oral Daily    pantoprazole  40 mg Oral Daily    petrolatum   Topical Q12H SCH    piperacillin-tazobactam  2.25 g Intravenous Q12H    umeclidinium-vilanterol  1 puff Inhalation QAM      dextrose 50 mL/hr at 08/30/22 1908    heparin infusion 25,000 units/500 mL (Cardiac/Low Intensity) 10 Units/kg/hr (08/30/22 1431)         PER MEDICINE:  Acute Hypoxic Respiratory Failure  Aspiration PNA (acute)  Volume overload  Acute Pulmonary edema   - Acute episode of nausea and vomiting, followed by pt becoming increasingly hypoxemic (08/27/22)  - Currently requiring 2L O2, at max was on 8L O2  - Telemetry monitoring. Continuous pulse ox.  - CXR 8/9 noted, RLL infiltrate  - DDimer 1.97. CT Angio Chest negative for PE, see report below.  - BNP 5,124. D/w Nephro Dr. [name] (08/27/22), will remove more fluid off with HD.  - Continue on IV Zosyn (renal dosing)  - Infectious Disease consulted. Appreciate recommendations. D/w Dr. Cecile Sheerer.  - Strict NPO. SLP following   - Aspiration precautions  - Palliative Care has seen this pt in past admissions, most recently in July 2024. PC notes reviewed, family declined to speak or meet with PC.    NOT STABLE FOR DISCHARGE TODAY,  STILL REQUIRING 2L O2.         _____________________________________________    ** This clinical review is compiled from documentation provided by the treatment team in the medical record. **  _____________________________________________      Lodema Pilot, RN, BSN, ACM-RN  Utilization Review Nurse II  [facility]  [address]  [address]  [place], [place] [address]  Phone: [phone]     [facility]:  Main Office Phone:  [phone], Option 3  Fax: [phone]  Tax ID:  [id number]  NPI:  [id number]    [facility]:    Main Office Phone:  [phone], Option 4  Fax: [phone]  Tax ID:  [id number]  NPI:  [id number]     [facility]:   Main Office Phone:  [phone], Option 1  Fax: [phone]  Tax ID:  [id number]  NPI:  [id number]        ------------------------------------------------------------------------

## 2022-08-30 NOTE — Plan of Care (Signed)
Problem: Inadequate Gas Exchange  Goal: Adequate oxygenation and improved ventilation  Outcome: Progressing  Flowsheets (Taken 08/30/2022 0124)  Adequate oxygenation and improved ventilation:   Assess lung sounds   Monitor SpO2 and treat as needed   Provide mechanical and oxygen support to facilitate gas exchange   Position for maximum ventilatory efficiency   Teach/reinforce use of incentive spirometer 10 times per hour while awake, cough and deep breath as needed   Plan activities to conserve energy: plan rest periods   Increase activity as tolerated/progressive mobility   Consult/collaborate with Respiratory Therapy     Problem: Hemodynamic Status: Cardiac  Goal: Stable vital signs and fluid balance  Outcome: Progressing  Flowsheets (Taken 08/30/2022 0124)  Stable vital signs and fluid balance:   Assess signs and symptoms associated with cardiac rhythm changes   Monitor lab values     Problem: Pain  Goal: Pain at adequate level as identified by patient  Outcome: Progressing  Flowsheets (Taken 08/30/2022 0124)  Pain at adequate level as identified by patient:   Identify patient comfort function goal   Assess for risk of opioid induced respiratory depression, including snoring/sleep apnea. Alert healthcare team of risk factors identified.   Assess pain on admission, during daily assessment and/or before any "as needed" intervention(s)   Evaluate if patient comfort function goal is met   Reassess pain within 30-60 minutes of any procedure/intervention, per Pain Assessment, Intervention, Reassessment (AIR) Cycle   Evaluate patient's satisfaction with pain management progress   Offer non-pharmacological pain management interventions   Include patient/patient care companion in decisions related to pain management as needed     Problem: Compromised Friction/Shear  Goal: Friction and Shear Interventions  Outcome: Progressing  Flowsheets (Taken 08/30/2022 0124)  Friction and Shear Interventions: Pad bony prominences, Off  load heels, HOB 30 degrees or less unless contraindicated, Consider: TAP seated positioning, Heel foams     Problem: Compromised Nutrition  Goal: Nutrition Interventions  Outcome: Progressing  Flowsheets (Taken 08/30/2022 0124)  Nutrition Interventions: Discuss nutrition at Rounds, I&Os, Document % meal eaten, Daily weights     Problem: Compromised Activity/Mobility  Goal: Activity/Mobility Interventions  Outcome: Progressing  Flowsheets (Taken 08/30/2022 0124)  Activity/Mobility Interventions: Pad bony prominences, TAP Seated positioning system when OOB, Promote PMP, Reposition q 2 hrs / turn clock, Offload heels     Problem: Compromised Moisture  Goal: Moisture level Interventions  Outcome: Progressing  Flowsheets (Taken 08/30/2022 0124)  Moisture level Interventions: Moisture wicking products, Moisture barrier cream     Problem: Compromised Sensory Perception  Goal: Sensory Perception Interventions  Outcome: Progressing  Flowsheets (Taken 08/30/2022 0124)  Sensory Perception Interventions: Offload heels, Pad bony prominences, Reposition q 2hrs/turn Clock, Q2 hour skin assessment under devices if present

## 2022-08-30 NOTE — Progress Notes (Signed)
Arrived at the patient's room to initiate HD. Patient is alert to name but confused with Incoherent thought process. Time out started. Right SC CVC Patent. Blood return present. TX soon to be initiated.. No verbal complaints. Continuously moaning and groaning. BP low starting BP. Albumin to be given with HD. Will continue to monitor patient during Hemodialysis                   08/30/22 0900   Bedside Nurse Communication   Name of bedside RN - pre dialysis Njorge CViolet   Treatment Initiation- With Dialysis Precautions   Time Out/Safety Check Completed Yes   Consent for HD signed for this hospitalization (Date) 06/01/22   Consent for HD signed for this hospitalization (Time) 1516   Preferred language Yes   Complication waiver N   Blood Consent Verified Yes   Dialysis Precautions All Connections Secured   Dialysis Treatment Type Routine   Is patient diabetic? Yes   RO/Hemodialysis Cabin crew   Is Total Chlorine less than 0.1 ppm? Yes   Orignial Total Chlorine Testing Time 0900   At 4 Hour Total Chlorine Testing Time 1300   RO/Hemodialysis Arts development officer Number 2    Machine Serial Number [id number]   RO # 31   RO Serial # [id number]   Water Hardness NA   pH 7.4   Pressure Test Verified Yes   Alarms Verified Passed   Machine Temperature 96.8 F (36 C)   Manual Machine Temperature 96.8 F (36 C)   Alarms Verified Yes   Na+ mEq (Machine) 138 mEq   Bicarb mEq (Machine) 35 mEq   Hemodialysis Conductivity (Machine) 13.6   Hemodialysis Conductivity (Meter) 13.6   Dialyzer Lot Number [id number]   Tubing Lot Number [id number]   04/17/2024   RO Machine Log Completed Yes   Hepatitis Status   HBsAg (Antigen) Result Negative   HBsAg Date Drawn 06/01/22   HBsAg Repeat Draw Due Date 07/31/22   HBsAb (Antibody) Result Reactive   Hepatitis C Antibody Non-Reactive   Dialysis Weight   Scale Type N/A   Vitals   Temp 98.1 F (36.7 C)   Heart Rate 78   Resp Rate 18   BP 101/47   SpO2 96 %   O2 Device Nasal cannula    Assessment   Mental Status Alert;Oriented;Cooperative   Cardiac (WDL) WDL   Cardiac Regularity Return to [facility]   Cardiac Rhythm Normal sinus rhythm   Respiratory Pattern Labored   Bilateral Breath Sounds Diminished   R Breath Sounds Diminished   Cough Non-productive   Edema  WDL   Generalized Edema Non Pitting Edema   General Skin Color Appropriate for ethnicity   Skin Temp Cool   Gastrointestinal (WDL) WDL   Abdomen Inspection Return to [facility]   GI Conditions Return to [facility]   Mobility Bed   Permacath Catheter - Tunneled 08/23/22 Subclavian vein catheter Right   Placement Date/Time: (c) 08/23/22 1047   Inserted by: Dr. Bonita Quin  Access Type: Subclavian vein catheter  Orientation: Right  Central Line Infection Prevention Education provided?: Yes  Hand Hygiene: Chlorhexidine;Alcohol based hand scrub;Soap and water  ...   Line necessity reviewed? Apheresis/hemodialysis   Site Assessment Clean;Dry;Intact   Catheter Lumen Volume Venous 1700 mL   Catheter Lumen Volume Arterial 1600 mL   Dressing Status and Intervention Dressing Intact   Tego/Curos Caps on Catheter Yes   End Caps Free From Blood Yes  Line Care Connections checked and tightened   Dressing Type Transparent;Biopatch   Dressing Status Clean;Dry;Intact   Dressing/Line Intervention Caps changed   Line Used For Blood Draw No   Dressing Change Due 09/01/22   Pain Assessment   Charting Type Assessment   Pain Scale Used Numeric Scale (0-10)   Numeric Pain Scale   Pain Score 0   POSS Score 1   Hemodialysis Comments   Pre-Hemodialysis Comments TIME OUT STARTED   Hemodialysis History Information   Outpatient Dialysis Unit N/a   Chronic Dialysis Patient Yes   Outpatient Nephrologist Dr. [name]

## 2022-08-30 NOTE — Progress Notes (Signed)
Wound Ostomy Continence Consultation / Progress Note    Date Time: 08/30/22 1:38 PM  Patient Name: NGOZI, CHRISTINA  Consulting Service: [facility] Day: 10     Reason for Consult / Follow Up   F/U visit completed d/t continued hospitalization    Assessment & Plan   Assessment:   NPWT dressing change completed, pt was premedicated for pain management, assisted by primary RN.  Abdominal wound malodorous when dressing removed however no purulence appreciated.  Wound bed cleansed and reapplied NPWT dressing.    Skin assessment completed, included below.      Integumentary  Integumentary (WDL): All findings assessed as WDL except as noted  General Skin Color: Appropriate for ethnicity  Skin Temp: Warm, Dry  Skin Assessment: Moisture Associated Skin Damage, Blanchable Redness, Scars  Image: Images linked  Scar Location: Buttocks/Sacrum  Blanchable Redness Location: Bilateral Heels  MASD/IAD Location: Buttocks/Sacrum  Treatment: Moisturizer (Aquaphor)  MASD/IAD Assessment: Denuded, Dry, Pink   Buttocks      Wound 06/30/22 Abdomen Anterior (Active)   Date First Assessed/Time First Assessed: 06/30/22 1152   Wound Type: Surgical Incision  Location: Abdomen  Wound Location Orientation: Anterior      Assessments 08/30/2022  1:11 PM   Wound Image     Wound Base Description Fibrin/slough;Granulation tissue;Moist;Red;Tan (Malodorous)   % Granular 80   % Non-viable 20   Peri-wound Description Hyperpigmented;Intact   Wound Length (cm) 21 cm   Wound Width (cm) 13 cm   Wound Depth (cm) 4.2 cm   Wound Surface Area (cm^2) 273 cm^2   Wound Volume (cm^3) 1146.6 cm^3   Closure Open   Drainage Amount Small   Drainage Description Serosanguinous   Tunneling Yes   Tunnel 1 location/measurement 6cm7-8:00   Undermining  No   Margins Open   Treatments Cleansed;Negative Pressure Wound Therapy   Topical Hyphochlorous Acid       Negative Pressure Wound Therapy Abdomen (Active)   Placement Date/Time: 08/25/22 1258   Present on Admission?:  Yes  Wound Type: Surgical  Location: Abdomen  Wound Location Orientation: Anterior;Medial;Lower      Assessments 08/30/2022  1:11 PM   Therapy Type Traditional   Dressing Status Compressed   Target Pressure (mmHg) 125   Negative Pressure Type Continuous   Intensity Low   Drainage type [place]osang   Action Dressing changed   Pieces of foam removed 1   Contact layer removed N/A   Pieces of foam applied 2   Type of Foam Simplace   Contact layer/pieces/type/accessories 2 pieces     Plan/Follow-Up:   Planned for next NPWT dressing change on Wednesday 8/14  Recommend continued use of Aquaphor and continued PI prevention interventions  No changes, discussed with primary RN  Depending on GOC, recommend conversations to take place re: nutritional needs in setting of wound healing/management    History of Present Illness   This is a 82 y.o. female  has a past medical history of Acid reflux, Asthma, well controlled, Cancer of kidney, Chronic lower back pain, Congestive heart disease, COPD (chronic obstructive pulmonary disease), CVA (cerebral infarction) (01/12/2003), Diabetes, Diabetic nephropathy, Fibroids, Gout, H/O: gout, Hyperlipidemia, Hypertension, Neuropathy of hand, and SOB (shortness of breath)..  Admitted with Hemodialysis catheter dysfunction, initial encounter.        Procedure(s):  DEBRIDEMENT & IRRIGATION, WOUND  WOUND VAC APPLICATION    7 Days Post-Op  -------------------    Procedure(s):  DEBRIDEMENT & IRRIGATION, WOUND  WOUND VAC APPLICATION  5 Days Post-Op  -------------------        Betsey Amen MSN, RN, CWOCN  Wound and Ostomy Service  [phone]

## 2022-08-31 ENCOUNTER — Inpatient Hospital Stay: Payer: Medicare Other

## 2022-08-31 LAB — CBC
Absolute nRBC: 0.1 10*3/uL — ABNORMAL HIGH (ref <=0.00)
Hematocrit: 26.2 % — ABNORMAL LOW (ref 34.7–43.7)
Hemoglobin: 8.3 g/dL — ABNORMAL LOW (ref 11.4–14.8)
MCH: 29.3 pg (ref 25.1–33.5)
MCHC: 31.7 g/dL (ref 31.5–35.8)
MCV: 92.6 fL (ref 78.0–96.0)
MPV: 9.5 fL (ref 8.9–12.5)
Platelet Count: 313 10*3/uL (ref 142–346)
RBC: 2.83 10*6/uL — ABNORMAL LOW (ref 3.90–5.10)
RDW: 17 % — ABNORMAL HIGH (ref 11–15)
WBC: 21.28 10*3/uL — ABNORMAL HIGH (ref 3.10–9.50)
nRBC %: 0.5 /100 WBC — ABNORMAL HIGH (ref <=0.0)

## 2022-08-31 LAB — MAGNESIUM: Magnesium: 1.9 mg/dL (ref 1.6–2.6)

## 2022-08-31 LAB — APTT
PTT: 51 s — ABNORMAL HIGH (ref 27–39)
PTT: 50 s — ABNORMAL HIGH (ref 27–39)

## 2022-08-31 LAB — CULTURE, METHICILLIN RESISTANT STAPHYLOCOCCUS AUREUS (MRSA)

## 2022-08-31 LAB — PHOSPHORUS: Phosphorus: 1.9 mg/dL — ABNORMAL LOW (ref 2.3–4.7)

## 2022-08-31 MED ORDER — MIDODRINE HCL 5 MG PO TABS
5.0000 mg | ORAL_TABLET | Freq: Two times a day (BID) | ORAL | Status: DC
Start: 2022-09-01 — End: 2022-09-03
  Administered 2022-09-01 – 2022-09-03 (×5): 5 mg via NASOGASTRIC
  Filled 2022-08-31 (×5): qty 1

## 2022-08-31 MED ORDER — LIDOCAINE HCL URETHRAL/MUCOSAL 2 % EX PRSY
PREFILLED_SYRINGE | Freq: Once | CUTANEOUS | Status: DC | PRN
Start: 2022-08-31 — End: 2022-09-03
  Filled 2022-08-31 (×2): qty 6

## 2022-08-31 MED ORDER — MONTELUKAST SODIUM 10 MG PO TABS
10.0000 mg | ORAL_TABLET | Freq: Every evening | ORAL | Status: DC
Start: 2022-08-31 — End: 2022-09-03
  Administered 2022-08-31 – 2022-09-02 (×3): 10 mg via NASOGASTRIC
  Filled 2022-08-31 (×3): qty 1

## 2022-08-31 MED ORDER — AMIODARONE HCL 200 MG PO TABS
200.0000 mg | ORAL_TABLET | Freq: Every day | ORAL | Status: DC
Start: 2022-09-01 — End: 2022-09-03
  Administered 2022-09-01 – 2022-09-02 (×2): 200 mg via NASOGASTRIC
  Filled 2022-08-31 (×3): qty 1

## 2022-08-31 MED ORDER — MEGESTROL ACETATE 400 MG/10ML PO SUSP
400.0000 mg | Freq: Two times a day (BID) | ORAL | Status: DC
Start: 2022-09-01 — End: 2022-09-01
  Administered 2022-09-01: 400 mg via NASOGASTRIC
  Filled 2022-08-31: qty 10

## 2022-08-31 MED ORDER — CINACALCET HCL 30 MG PO TABS
30.0000 mg | ORAL_TABLET | Freq: Every evening | ORAL | Status: DC
Start: 2022-09-01 — End: 2022-09-03
  Administered 2022-09-01 – 2022-09-02 (×2): 30 mg via NASOGASTRIC
  Filled 2022-08-31 (×2): qty 1

## 2022-08-31 MED ORDER — ATORVASTATIN CALCIUM 20 MG PO TABS
40.0000 mg | ORAL_TABLET | Freq: Every day | ORAL | Status: DC
Start: 2022-09-01 — End: 2022-09-03
  Administered 2022-09-01 – 2022-09-03 (×3): 40 mg via NASOGASTRIC
  Filled 2022-08-31 (×3): qty 2

## 2022-08-31 NOTE — Progress Notes (Signed)
[facility] PROGRESS NOTE  [id number]      Date Time: 08/31/22 10:07 AM  Patient Name: Shelby Barron  Attending Physician: Shelby Mattocks, MD    CC: follow-up ESRD    Assessment:   [name] MWF via [facility]  Malfunctioning permcath - s/p replacement 08/23/22 and now functioning without issues  Anemia due to CKD - Hgb below goal and ferritin elevated  Deconditioning s/p prolonged hospitalization at [facility]   Abdominal wound s/p debridement and irrigation and remains with wound vac in place   Aspiration PNA - on abx, worsening leucocytosis    Recommendations:     HD in am as per OP routine  UF w/ HD  as tolerated with Albumin and midodrine support  On ESA - monitor H/H  Hold Sevelamer - check phos with labs tomorrow  On Zosyn  Continue midodrine PRN  Guarded prognosis      Case discussed with: patient      Danie Chandler, MD  [facility]  [phone] (office)      Subjective:   Remains on 2L NC  Sleeping, arousable, slightly confused  Physical Exam:     Vitals:    08/31/22 0612 08/31/22 0641 08/31/22 0709 08/31/22 0759   BP: 110/57 122/63 142/61 123/65   Pulse: 71 79 80 76   Resp:    17   Temp:    98.4 F (36.9 C)   TempSrc:    Axillary   SpO2: 98% 95% 100% 99%   Weight:       Height:           Intake and Output Summary (Last 24 hours) at Date Time    Intake/Output Summary (Last 24 hours) at 08/31/2022 1007  Last data filed at 08/30/2022 1258  Gross per 24 hour   Intake --   Output 2300 ml   Net -2300 ml           General: no acute distress  Cardiovascular: S1 and S2, no MRG  Lungs: clear to auscultation anteriorly with diminished respiratory effort  Abdomen: soft, dressing in place  Extremities:No LE edema  RUE with ecchymoses  R TDC    Meds:      Scheduled Meds: PRN Meds:    amiodarone, 200 mg, Oral, Daily  atorvastatin, 40 mg, Oral, Daily  b complex-vitamin c-folic acid, 1 tablet, Oral, Daily  cinacalcet, 30 mg, Oral, QPM  darbepoetin alfa, 0.45 mcg/kg, Subcutaneous,  Weekly  megestrol, 400 mg, Oral, BID Meals  metoprolol tartrate, 2.5 mg, Intravenous, 4 times per day  midodrine, 5 mg, Oral, BID Meals  montelukast, 10 mg, Oral, QHS  oxybutynin XL, 5 mg, Oral, Daily  pantoprazole, 40 mg, Oral, Daily  petrolatum, , Topical, Q12H SCH  piperacillin-tazobactam, 2.25 g, Intravenous, Q12H  umeclidinium-vilanterol, 1 puff, Inhalation, QAM           sodium chloride, , PRN  acetaminophen, 650 mg, Q4H PRN  benzocaine-menthol, 1 lozenge, Q2H PRN  benzonatate, 100 mg, TID PRN  carboxymethylcellulose sodium, 1 drop, TID PRN  dextrose, 15 g of glucose, PRN   Or  dextrose, 12.5 g, PRN   Or  dextrose, 12.5 g, PRN   Or  glucagon (rDNA), 1 mg, PRN  heparin (porcine), 3,000 Units, PRN  lidocaine 2% jelly, , Once PRN  melatonin, 3 mg, QHS PRN  morphine, 2 mg, Q4H PRN  naloxone, 0.2 mg, PRN  ondansetron, 4 mg, Q8H PRN  oxyCODONE, 5 mg, Q6H PRN  polyethylene glycol, 17 g, Daily PRN  saline, 2 spray, Q4H PRN              Labs:     Recent Labs   Lab 08/31/22  0406 08/30/22  0549 08/29/22  0344   WBC 21.28* 22.12* 16.80*   Hemoglobin 8.3* 9.2* 8.2*   Hematocrit 26.2* 28.3* 25.7*   Platelet Count 313 337 369*     Recent Labs   Lab 08/31/22  0406 08/30/22  0549 08/29/22  0345 08/28/22  0341 08/27/22  0346   Sodium  --  139 142 142 142   Potassium  --  3.9 3.3* 3.7 4.2   Chloride  --  103 104 104 104   CO2  --  23 25 23 22    BUN  --  36* 27* 17 36*   Creatinine  --  4.9* 4.2* 3.4* 5.2*   Calcium  --  9.5 10.2 10.0 9.2   Albumin  --   --  2.4* 3.2* 2.0*   Phosphorus 1.9*  --  2.9 2.3 3.9   Magnesium 1.9 1.9 1.9 1.9 1.8   Glucose  --  93 72 111* 158*   GFR  --  8.2* 9.9* 12.7* 7.6*             Invalid input(s): "LEUKOCYTESUR"        Imaging personally reviewed, including: No results found.        Signed by: Danie Chandler, MD

## 2022-08-31 NOTE — SLP Progress Note (Signed)
Speech Language Pathology  SLP Treatment Note    Patient: Shelby Barron    MRN#: [id number]   Unit: T4C TELEMETRY  Bed: K742/V956-38    Recommendations:   Solids:  NPO  Liquids: NPO  Meds: IV     Precautions/Compensations:  continue good oral care    Referrals: Palliative Care Consult  SLP Frequency Recommended: 3-4x/wk  Plan of care: NPO/alternative feeding, precautions and strategy training, dysphagia treatment, patient/family education, and VFSS  Assessment:       Clinical signs of oral dysphagia, likely acute related to AMS. Instrumental swallow study is indicated prior to diet initiation, however, patient not appropriate for procedure at this time due minimal alertness. Given poor alertness, prolonged oral phase and poor task recognition, pt is at risk of aspiration. Pt does not demonstrate ability to safely consume an oral diet at this time. SLP will continue to follow for dysphagia (swallowing) therapy.     Rehabilitation Prognosis: fair due to age, family/community support, medical diagnosis/prognosis, and PLOF    Education:   Risks and benefits of evaluation discussed with patient and caregiver. Educated on role of speech therapy,  results of VFSS, plan of care, and goals of therapy and in agreement with POC. Educated on recommended diet, oral infection control, risks of aspiration, and s/s of aspiration. Demonstrated good understanding.     SLP contacted daughter [name] via phone to discuss role and rationale of skilled ST, results and recommendations of evaluation and POC. Will continue open communication.    Subjective:   Interpreter utilized: no, not indicated     Family and/or guardian are agreeable to patient's participation in the therapy session. Patient's medical condition is appropriate for Speech therapy intervention at this time. Sister at bedside.     PAIN: no     Behavior/cognition: lethargic, does not follow commands, and distractible     Orientation: Oriented to self    Objective:   Current  diet: NPO   Position: in bed  Medical equipment in place: telemetry   Respiratory status:  2 L O2 via NC  Precautions: fall and aspiration    IDDSI oral trials:  Ice   Oral: oral holding     Pharyngeal: no overt signs or symptoms of aspiration       Amount: 2 chips     Esophageal Phase: unremarkable    Goals:   Goals  Patient will use lingual sweep to clear buccal cavity to reduce residue from oral cavity during p.o. intake: with minimal verbal instruction, in 5 sessions  Patient will demonstrate adequate oropharyngeal phase skills for a: Thin Liquids (IDDSI TN0), Pureed (IDDSI P4), without overt s/s of aspiration 5/5 boluses    Patient left with call bell within reach, all needs met, SCDs in place, and all questions answered.     Treatment Time: Start Time: 1400 to Stop Time: 1415    Signature: Juanetta Beets MA. CCC-SLP 08/31/2022  ext. [id number] [phone])

## 2022-08-31 NOTE — Plan of Care (Signed)
Problem: Moderate/High Fall Risk Score >5  Goal: Patient will remain free of falls  Outcome: Progressing  Flowsheets (Taken 08/30/2022 2155)  High (Greater than 13):   HIGH-Bed alarm on at all times while patient in bed   HIGH-Utilize chair pad alarm for patient while in the chair   HIGH-Apply yellow "Fall Risk" arm band   LOW-Fall Interventions Appropriate for Low Fall Risk   MOD-Include family in multidisciplinary POC discussions   MOD-Place Fall Risk level on whiteboard in room     Problem: Compromised Sensory Perception  Goal: Sensory Perception Interventions  Outcome: Progressing  Flowsheets (Taken 08/30/2022 2155)  Sensory Perception Interventions: Offload heels, Pad bony prominences, Reposition q 2hrs/turn Clock, Q2 hour skin assessment under devices if present     Problem: Compromised Moisture  Goal: Moisture level Interventions  Outcome: Progressing  Flowsheets (Taken 08/30/2022 2155)  Moisture level Interventions: Moisture wicking products, Moisture barrier cream     Problem: Compromised Activity/Mobility  Goal: Activity/Mobility Interventions  Outcome: Progressing  Flowsheets (Taken 08/30/2022 2155)  Activity/Mobility Interventions: Pad bony prominences, TAP Seated positioning system when OOB, Promote PMP, Reposition q 2 hrs / turn clock, Offload heels     Problem: Compromised Nutrition  Goal: Nutrition Interventions  Outcome: Progressing  Flowsheets (Taken 08/30/2022 2155)  Nutrition Interventions: Discuss nutrition at Rounds, I&Os, Document % meal eaten, Daily weights     Problem: Compromised Friction/Shear  Goal: Friction and Shear Interventions  Outcome: Progressing  Flowsheets (Taken 08/30/2022 2155)  Friction and Shear Interventions: Pad bony prominences, Off load heels, HOB 30 degrees or less unless contraindicated, Consider: TAP seated positioning, Heel foams     Problem: Renal Instability  Goal: Fluid and electrolyte balance are achieved/maintained  Outcome: Progressing  Flowsheets (Taken 08/31/2022  0243)  Fluid and electrolyte balance are achieved/maintained:   Monitor/assess lab values and report abnormal values   Assess and reassess fluid and electrolyte status   Observe for cardiac arrhythmias   Monitor for muscle weakness   Follow fluid restrictions/IV/PO parameters  Goal: Nutritional intake is adequate  Outcome: Progressing  Flowsheets (Taken 08/31/2022 0243)  Nutritional intake is adequate:   Consult/collaborate with Clinical Nutritionist   Consult/collaborate with Speech Therapy (swallow evaluations)  Goal: Perineal skin integrity is maintained or improved and remains free from infection  Outcome: Progressing  Flowsheets (Taken 08/31/2022 0243)  Perineal skin integrity is maintained or improved: Apply urinary containment device as appropriate and/or per order     Problem: Patient Receiving Advanced Renal Therapies  Goal: Therapy access site remains intact  Outcome: Progressing  Flowsheets (Taken 08/31/2022 0243)  Therapy access site remains intact:   Assess therapy access site   Change therapy access site dressing as needed     Problem: Nutrition  Goal: Nutritional intake is adequate  Outcome: Progressing  Flowsheets (Taken 08/31/2022 0243)  Nutritional intake is adequate:   Consult/collaborate with Clinical Nutritionist   Consult/collaborate with Speech Therapy (swallow evaluations)     Problem: Pain interferes with ability to perform ADL  Goal: Pain at adequate level as identified by patient  Outcome: Progressing  Flowsheets (Taken 08/31/2022 0243)  Pain at adequate level as identified by patient:   Assess for risk of opioid induced respiratory depression, including snoring/sleep apnea. Alert healthcare team of risk factors identified.   Assess pain on admission, during daily assessment and/or before any "as needed" intervention(s)   Reassess pain within 30-60 minutes of any procedure/intervention, per Pain Assessment, Intervention, Reassessment (AIR) Cycle   Evaluate if patient comfort function  goal is  met   Offer non-pharmacological pain management interventions   Consult/collaborate with Physical Therapy, Occupational Therapy, and/or Speech Therapy   Include patient/patient care companion in decisions related to pain management as needed     Problem: Side Effects from Pain Analgesia  Goal: Patient will experience minimal side effects of analgesic therapy  Outcome: Progressing  Flowsheets (Taken 08/31/2022 0243)  Patient will experience minimal side effects of analgesic therapy:   Monitor/assess patient's respiratory status (RR depth, effort, breath sounds)   Assess for changes in cognitive function   Prevent/manage side effects per LIP orders (i.e. nausea, vomiting, pruritus, constipation, urinary retention, etc.)   Evaluate for opioid-induced sedation with appropriate assessment tool (i.e. POSS)     Problem: Inadequate Gas Exchange  Goal: Adequate oxygenation and improved ventilation  Outcome: Progressing  Flowsheets (Taken 08/31/2022 0243)  Adequate oxygenation and improved ventilation:   Assess lung sounds   Monitor SpO2 and treat as needed   Position for maximum ventilatory efficiency   Plan activities to conserve energy: plan rest periods   Increase activity as tolerated/progressive mobility   Consult/collaborate with Respiratory Therapy  Goal: Patent Airway maintained  Outcome: Progressing  Flowsheets (Taken 08/31/2022 0243)  Patent airway maintained:   Position patient for maximum ventilatory efficiency   Suction secretions as needed   Reposition patient every 2 hours and as needed unless able to self-reposition     Problem: Hemodynamic Status: Cardiac  Goal: Stable vital signs and fluid balance  Outcome: Progressing  Flowsheets (Taken 08/31/2022 0243)  Stable vital signs and fluid balance:   Assess signs and symptoms associated with cardiac rhythm changes   Monitor lab values     Problem: Pain  Goal: Pain at adequate level as identified by patient  Outcome: Progressing  Flowsheets (Taken 08/31/2022  0243)  Pain at adequate level as identified by patient:   Assess for risk of opioid induced respiratory depression, including snoring/sleep apnea. Alert healthcare team of risk factors identified.   Assess pain on admission, during daily assessment and/or before any "as needed" intervention(s)   Reassess pain within 30-60 minutes of any procedure/intervention, per Pain Assessment, Intervention, Reassessment (AIR) Cycle   Evaluate if patient comfort function goal is met   Offer non-pharmacological pain management interventions   Consult/collaborate with Physical Therapy, Occupational Therapy, and/or Speech Therapy   Include patient/patient care companion in decisions related to pain management as needed

## 2022-08-31 NOTE — Progress Notes (Signed)
[facility]  HOSPITALIST  PROGRESS NOTE      Patient: Shelby Barron  Date: 08/31/2022   LOS: 9 Days  Admission Date: 08/21/2022   MRN: [id number]  Attending:  Shelby Mattocks, MD     ASSESSMENT/PLAN     Shelby Barron is a 82 y.o. female with PMH AF on Eliquis, chronic HFpEF, HTN, HLD, h/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 during complicated admission at [facility], [name], RCC s/p bilateral nephrectomy, urothelial CA s/p cystectomy c/b small bowel enterotomy s/p small bowel anastomosis and abdominal closure requiring wound vac, anemia of chronic disease, COPD, h/o T2DM (last A1c 5.6 in June 2024), dysphagia, gout, admitted with malfunctioning HD catheter with need for HD and non-healing abdominal surgical wound.    Acute Hypoxic Respiratory Failure  Aspiration PNA (acute)  Volume overload  Acute Pulmonary edema   - Acute episode of nausea and vomiting, followed by pt becoming increasingly hypoxemic (08/27/22)  - Currently requiring 2L O2, at max was on 8L O2  - Telemetry monitoring. Continuous pulse ox.  - CXR 8/9 noted, RLL infiltrate  - DDimer 1.97. CT Angio Chest negative for PE, see report below.  - BNP was elevated, more fluid removed with HD  - Continue on IV Zosyn (renal dosing)-total duration of Abx per ID recs   - Strict NPO. SLP following. Patient continues to fail swallow eval    - Palliative Care has seen this pt in past admissions, most recently in July 2024. Family not interested in palliative care or hospice  - Daughters want to continue aggressive measures, including artifical nutrition. Daughters want to proceed with feeding tube-Nutrition consulted for placement of Envue feeding tube. Cannot do PEG at this time due to extensive abd surgeries, and extensive wound with wound vac on abdomen  - Patient will need LTAC placement due to higher level of nursing care she needs    Paroxysmal A-Fib  - Given high aspiration risk, pt unable to safely take po meds   - Discontinued home po Coreg. Started IV  Metoprolol 2.5 mg every 6 hours.  - Started on Amiodarone gtt per [facility] recs, received only a couple of hours of gtt prior to converting to SR, then it was discontinued 08/27/12  - Continue home Amiodarone when able to take po meds  - Continue on Heparin gtt, may transition to Eliquis once feeding tube placed  - If Hg is trending down to < 8 grams, will need to consider stopping anticoagulation  - Cardiology consulted, [facility], appreciate recommendations.    Non-healing abdominal surgical wound s/p I&D 08/25/2022 by Dr. Morton Stall bowel enterotomy s/p small bowel anastomosis, abd closure, wound vac  - SIRS present on admission with persistent leukocytosis, tachycardia  - Appreciate Surgery and WOCN consult and recommendations after malfunctioning wound vac removed  - In OR, large mass of necrotic fat overlying pus pocket incised and drained, no cultures  - Received IV Vanco and CTX, discontinued, d/w Dr. Zollie Beckers [facility] 08/27/22, no abx needed   - Currently on Heparin gtt, may transition to Eliquis once cleared to take po meds or once feeding tube placed   - Maintain wound vac    Acute blood loss anemia 2/to abdominal incision and drainage  Anemia of chronic kidney disease, stable  Iron deficiency anemia  Thrombocytosis, suspect reactive  - Hb dropped to 7, was given 1u PRBC 08/26/22 with good response   - No active signs of bleeding  - Iron studies  noted  - On Aranesp per Nephro  - Monitor CBC  - If Hg is trending down to < 8 grams, will need to consider stopping anticoagulation    Malfunctioning HD Catheter  [name] MWF  H/o RCC s/p bilateral nephrectomy  Urothelial CA s/p cystectomy  - Appreciate IR and [facility] consult and recommendations  - S/p Permacath exchange by IR 08/23/22  - Continue MWF HD schedule  - Continue Midodrine for hypotension (hold for SBP>120)    Chronic HFpEF  HTN  HLD  H/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  - Discontinued po Carvedilol (npo status) --> on IV Metoprolol  every 6 hours  - Resume home Amiodarone when able to take po meds or after feeding tube placed  - Resume home Atorvastatin when able to take po meds  - Continue on Heparin gtt, may transition to Eliquis when able to take po meds    Deconditioned state s/p prolonged hospitalisation  - PT/OT recommend SNF. Patient likely will need LTAC placement given multiple issues and frequent nursing care including frequent suctioning of secretions   - Fall precautions, skin precautions  - CM working on safe disposition    Severe Protein Calorie Malnutrition related to inadequate oral intake in the setting of acute illness as evidenced by 9% weight loss x 1 month,  moderate muscle wasting (temporalis, pectoralis, deltoid) and moderate fat loss (orbital, buccal, triceps)   - Appreciate Nutrition consult and recommendations  - Daughters want to proceed with feeding tube placement     Obesity Class I, BMI 30  - Lifestyle modifications impractical for largely bed-bound patient    Providing daily updates to patient's daughter [name] on phone    Analgesia: Tylenol prn     FEN  Fluids: SL  Nutrition: Strict NPO since 08/27/22    Monitoring:  Telemetry needed: Yes  Continuous pulse oximetry needed: Yes  Level of Care: 3    Safety Checklist  DVT prophylaxis: Chemical   Foley: Not present   IVs:  Peripheral IV   PT/OT: Not needed   Daily CBC & or Chem ordered: Yes, due to clinical and lab instability       Patient Lines/Drains/Airways Status       Active PICC Line / CVC Line / PIV Line / Drain / Airway / Intraosseous Line / Epidural Line / ART Line / Line / Wound / Pressure Ulcer / NG/OG Tube       Name Placement date Placement time Site Days    Permacath Catheter - Tunneled 07/14/22 Subclavian vein catheter Right 07/14/22  1404  -- 38    Peripheral IV 20 G Standard Anterior;Right Wrist --  --  Wrist  --    Wound 06/30/22 Surgical Incision Abdomen Other (Comment) WOUND VAC applied 06/30/22  1152  Abdomen  52    Wound 07/23/22 Skin Tear  Buttocks;Coccyx Inner R and L cheek, coccyx 07/23/22  --  Buttocks;Coccyx  30                       Code Status: full code    DISPO: likely will need LTAC on discharge. CM working on dispo planning     Family Contact: daughter     Care Plan discussed with nursing, consultants       SUBJECTIVE     Patient seen and examined. Non verbal. Opens eyes to voice. Overnight, no significant events per RN. Mental state remains unchanged, failing  swallow eval    MEDICATIONS     Current Facility-Administered Medications   Medication Dose Route Frequency    amiodarone  200 mg Oral Daily    atorvastatin  40 mg Oral Daily    b complex-vitamin c-folic acid  1 tablet Oral Daily    cinacalcet  30 mg Oral QPM    darbepoetin alfa  0.45 mcg/kg Subcutaneous Weekly    megestrol  400 mg Oral BID Meals    metoprolol tartrate  2.5 mg Intravenous 4 times per day    midodrine  5 mg Oral BID Meals    montelukast  10 mg Oral QHS    oxybutynin XL  5 mg Oral Daily    pantoprazole  40 mg Oral Daily    petrolatum   Topical Q12H SCH    piperacillin-tazobactam  2.25 g Intravenous Q12H    umeclidinium-vilanterol  1 puff Inhalation QAM       ROS     All other systems not mentioned in subjective reviewed and negative - unable to obtain    PHYSICAL EXAM     Vitals:    08/31/22 1141   BP: 137/69   Pulse: 76   Resp: 17   Temp: 98.6 F (37 C)   SpO2: 98%       Temperature: Temp  Min: 97.9 F (36.6 C)  Max: 99.7 F (37.6 C)  Pulse: Pulse  Min: 71  Max: 94  Respiratory: Resp  Min: 16  Max: 19  Non-Invasive BP: BP  Min: 104/54  Max: 161/67  Pulse Oximetry SpO2  Min: 93 %  Max: 100 %    Intake and Output Summary (Last 24 hours) at Date Time    Intake/Output Summary (Last 24 hours) at 08/31/2022 1212  Last data filed at 08/30/2022 1258  Gross per 24 hour   Intake --   Output 2300 ml   Net -2300 ml       GEN APPEARANCE: Elderly, ill appearing, lethargic, non-verbal. Opens eyes to voice   HEENT: NC/AT; Conjunctiva Clear, Sclerae Anicteric; MM dry. + oral secretions    NECK: Supple  CVS: Tachycardic, Irreg, Irreg, S1, S2; No Murmur  LUNGS: Bibasilar Rales, coarse breath sounds  ABD: Soft; ND; generalized TTP; Large non-healing abdominal surgical wound/incision with wound vac in place post-operatively  EXT: No LE edema  SKIN: No rash or lesions  NEURO: Nonverbal and not following commands on exam; opens eyes to voice. Neuro exam limited     LABS     Recent Labs   Lab 08/31/22  0406 08/30/22  0549 08/29/22  0344   WBC 21.28* 22.12* 16.80*   RBC 2.83* 3.12* 2.78*   Hemoglobin 8.3* 9.2* 8.2*   Hematocrit 26.2* 28.3* 25.7*   MCV 92.6 90.7 92.4   Platelet Count 313 337 369*       Recent Labs   Lab 08/31/22  0406 08/30/22  0549 08/29/22  0345 08/28/22  0341 08/27/22  0346 08/26/22  2320   Sodium  --  139 142 142 142 143   Potassium  --  3.9 3.3* 3.7 4.2 4.4   Chloride  --  103 104 104 104 104   CO2  --  23 25 23 22 21    BUN  --  36* 27* 17 36* 33*   Creatinine  --  4.9* 4.2* 3.4* 5.2* 5.2*   Glucose  --  93 72 111* 158* 150*   Calcium  --  9.5 10.2 10.0 9.2  9.2   Magnesium 1.9 1.9 1.9 1.9 1.8  --        Recent Labs   Lab 08/29/22  0345 08/28/22  0341 08/27/22  0346   Albumin 2.4* 3.2* 2.0*       Recent Labs   Lab 08/31/22  0406 08/30/22  2038 08/30/22  1540   PTT 51* 54* 54*     RADIOLOGY     Radiological Procedure personally reviewed and concur with radiologist reports unless stated otherwise.    CT Angiogram Chest   Final Result         1. No pulmonary embolism.   2. Emphysema. Bibasilar lung consolidation.   3. Coronary calcification. Mild cardiomegaly.   4. Moderate hiatal hernia.   5. Ascites. Left posterior lower chest wall/abdominal wall hernia noted.      Nelta Numbers, MD   08/27/2022 6:56 PM      XR Chest AP Portable   Final Result         1. Increasing airspace opacity in the right lower lobe.   2. Stable pulmonary edema pattern.      Colonel Bald, MD   08/27/2022 6:47 AM      XR Chest AP Portable   Final Result      Mild interstitial edema.      Shelly Flatten, MD    08/26/2022 6:19 PM      Tunneled Cath Check/Change (Permcath)   Final Result      Right-sided PermCath exchange with new 14.5 French 19 cm PermCath with tip   in the right atrium.      PLAN:    Catheter ready for immediate use.      Darci Current, MD   08/23/2022 12:49 PM      XR Chest  AP Portable   Final Result      No significant change mild pulmonary vascular congestion and mild basal   atelectasis. Hiatal hernia.      Clide Cliff, MD   08/21/2022 2:35 PM          Signed,  Andi Hence, MD  12:12 PM 08/31/2022

## 2022-08-31 NOTE — Consults (Addendum)
Nutritional Support Services  Nutrition Follow Up    Shelby Barron 82 y.o. female   MRN: [id number]    Summary of Nutrition Recommendations:     1. NPO, diet advancement per SLP recs   2. If initiation of TF aligns with GOC, recommend: Nepro, goal of 40 ml/hr + 2 Pkts ProSource/d    - provides 960 ml total volume, 1888 kcals, 118g protein, and 697 ml free H2O (100% of nutrition needs   - Flush tube with minimum of 30 ml water Q 4 hours for tube patency- additional fluids for hydration per MD   3. Continue Juven BID for wound healing  - Dilute Juven with 120 ml of water (4 oz) and administer via syringe down feeding tube.   - Flush feeding tube with 30 ml of water before and after Juven administration.    4. Recommend bridling tube to secure placement given pt w/ hx of dementia   5. If longterm alternate feeding is being considered, recommend reviewing risks/ burdens/ benefits of TF in the setting of dementia. See statement below.     According to the position paper by the American Society of Parental and Enteral Nutrition entitled Ethical Aspects of Artificially Administered Nutrition and Hydration: An ASPEN Position Paper released in January 2021:    Enteral Nutrition (EN) neither stops dementia disease progression nor prevents imminent death.  Each decision for feeding tube placement in individuals with Advanced Dementia should be made on a case-by-case basis and involve an interdisciplinary team.  Careful considerations of the benefits-harm ratio should be discussed and checked with family and/or surrogate to determine whether they would be consistent with the preferences of a person with dementia.      Risks and Burdens   Aspiration  Cellulitus, stomal inflammation, excoriation at tube site  Deprived pleasure of eating  Diarrhea  Fluid overload  Gastrointestinal and venous distention  Increased use of pharmacological sedation  Increased use of restraints  Nausea and vomiting  Negative impact on quality of life  through decreased social interaction at mealtime or decreased delivery of attention required during assisted oral feeding  Not associated with improved survival based on feeding tube insertion timing, relative to onset of eating problems  Peritonitis  Tube occlusion    Benefits  Artificial nutrition and hydration may help to prevent more serious debilitating conditions, when used at the early signs of malnutrition  Decreased distress in patients who experience coughing and choking with oral intake  Preservation of ability to perform simple instrumental daily life activities  Relief of dehydration  Weight maintenance    Benefits of careful hand-to-mouth feeding  Familiarity of food  Stimulates the sense of taste  Increases the level of interaction, communication and engagement between the patient and their family members/caretakers    -----------------------------------------------------------------------------------------------------------------                                                        Assessment Data:   Nutrition: Pt seen at bedside. Unable to participate in assessment. No family present. Pt appears to have continued with poor nutritional status prior to current NPO order (8/9). 0% intakes documented for meals and <25% intakes of Ensure. Overall, consistently meeting < 50% EEN this admission. Consult received for tube feeding placement and recs as pt now failing SLP  eval. Confirmed nutrition plans with team. Per MD, plan has been communicated to daughters, who wish to pursue aggressive measures and are agreeable to TF initiation. Recommendations provided above. Plan to place Envue electromagnetic feeding tube today. If longterm alternate feeding is being considered, recommend reviewing risks/ burdens/ benefits of TF in the setting of dementia. See statement above. Available to discuss with family if needed.     Hospital Admission:  82 y.o. female with PMH AF on Eliquis, chronic HFpEF, HTN, HLD, h/o  recent CVA with residual LUE flaccid paralysis during complicated admission at [facility], [name], RCC s/p bilateral nephrectomy, urothelial CA s/p cystectomy c/b small bowel enterotomy s/p small bowel anastomosis and abdominal closure requiring wound vac, anemia of chronic disease, COPD, h/o T2DM, dysphagia, gout, admitted with malfunctioning HD catheter with need for HD and non-healing abdominal surgical wound. S/p debridement 8/7. HD to continue as scheduled MWF. Episode of nausea/vomiting and hypoxic respiratory failure 08/26/22, likely aspiration pneumonia. SLP now recommending NPO/ alternate feeding. Consult received for feeding tube placement and recs.     Medical Hx:  has a past medical history of Acid reflux, Asthma, well controlled, Cancer of kidney, Chronic lower back pain, Congestive heart disease, COPD (chronic obstructive pulmonary disease), CVA (cerebral infarction) (01/12/2003), Diabetes, Diabetic nephropathy, Fibroids, Gout, H/O: gout, Hyperlipidemia, Hypertension, Neuropathy of hand, and SOB (shortness of breath).    PSH: has a past surgical history that includes Hernia repair; APPENDECTOMY (OPEN); Hysterectomy; tumor removed from kidney Marletta Lor 2015); Partial nephrectomy; TURBT; Drain (Other) (N/A, 05/31/2022); Tunneled Cath Placement (Permcath) (N/A, 06/01/2022); CYSTECTOMY, ILEOCONDUIT (N/A, 06/28/2022); LAPAROTOMY, NEPHRECTOMY (Bilateral, 06/28/2022); EXPLORATORY LAPAROTOMY, RESECTION SMALL BOWEL (N/A, 06/28/2022); WOUND VAC APPLICATION (N/A, 06/28/2022); CLOSURE, ENTEROTOMY, SMALL INTESTINE (06/28/2022); EXPLORATORY LAPAROTOMY (N/A, 06/30/2022); WOUND VAC APPLICATION (N/A, 06/30/2022); Tunneled Cath Removal (Permcath) (Right, 07/14/2022); Tunneled Cath Placement (Permcath) (Right, 07/14/2022); Tunneled Cath Check/Change (Permcath) (Right, 08/23/2022); DEBRIDEMENT & IRRIGATION, WOUND (N/A, 08/25/2022); and WOUND VAC APPLICATION (N/A, 08/25/2022).     Orders Placed This Encounter   Procedures    Diet NPO effective  now Except for: ICE CHIPS (sips by tsp when alert), OTHER (SEE COMMENTS)    Ensure Plus High Protein Supplement Quantity: A. One; Flavor: Strawberry; Frequency: TID (3 times a day) with meals    Juven Quantity: A. One; Frequency: BID (2 times a day) - with Breakfast and Dinner     Intake:     08/26/22 1350 08/26/22 2153 08/29/22 1000   Intake (mL)   Percent Meal Consumed (%) 0%  --   --    Data Collected by Clinical Technician  --   --    Supplement Source Ensure Plus Hi Protein - Vanilla Ensure High Protein Ensure High Protein   Percent Supplement Consumed (%) 25% 25% 0%      08/30/22 1000 08/30/22 2200   Intake (mL)   Percent Meal Consumed (%) 0%  --    Data Collected by  --   --    Supplement Source  --  Ensure High Protein   Percent Supplement Consumed (%) 0% 0%       ANTHROPOMETRIC  Height: 165.1 cm (5\' 5" )  Weight: 78.6 kg (173 lb 4.5 oz)     Weight Change: -2.36    Body mass index is 28.84 kg/m.       ESTIMATED NEEDS    Total Daily Energy Needs: 1572 to 1965 kcal  Method for Calculating Energy Needs: 20 kcal - 25 kcal per kg  at 78.6 kg (Actual  body weight)  Rationale: -       Total Daily Protein Needs: 117.9 to 141.48 g  Method for Calculating Protein Needs: 1.5 g - 1.8 g per kg at 78.6 kg (Actual body weight)  Rationale: wound, HD      Total Daily Fluid Needs: 1572 to 1965 ml  Method for Calculating Fluid Needs: 20 ml - 25 ml  per kg at 78.6 kg (Actual body weight)       Current Meds:  amiodarone, 200 mg, Daily  atorvastatin, 40 mg, Daily  b complex-vitamin c-folic acid, 1 tablet, Daily  cinacalcet, 30 mg, QPM  darbepoetin alfa, 0.45 mcg/kg, Weekly  megestrol, 400 mg, BID Meals  metoprolol tartrate, 2.5 mg, 4 times per day  midodrine, 5 mg, BID Meals  montelukast, 10 mg, QHS  oxybutynin XL, 5 mg, Daily  pantoprazole, 40 mg, Daily  petrolatum, , Q12H SCH  piperacillin-tazobactam, 2.25 g, Q12H  umeclidinium-vilanterol, 1 puff, QAM       dextrose 50 mL/hr at 08/30/22 1908    heparin infusion 25,000 units/500  mL (Cardiac/Low Intensity) 10 Units/kg/hr (08/30/22 1431)       Allergies[1]    Pertinent labs:  Recent Labs   Lab 08/31/22  0406 08/30/22  0549 08/29/22  0345 08/29/22  0344 08/28/22  0341 08/27/22  0346 08/26/22  2320 08/26/22  1907 08/26/22  0407   Sodium  --  139 142  --  142 142 143  --  142   Potassium  --  3.9 3.3*  --  3.7 4.2 4.4  --  4.4   Chloride  --  103 104  --  104 104 104  --  106   CO2  --  23 25  --  23 22 21   --  23   BUN  --  36* 27*  --  17 36* 33*  --  28*   Creatinine  --  4.9* 4.2*  --  3.4* 5.2* 5.2*  --  4.5*   Glucose  --  93 72  --  111* 158* 150*  --  117*   Calcium  --  9.5 10.2  --  10.0 9.2 9.2  --  8.6   Magnesium 1.9 1.9 1.9  --  1.9 1.8  --   --  1.9   Phosphorus 1.9*  --  2.9  --  2.3 3.9  --   --  2.9   GFR  --  8.2* 9.9*  --  12.7* 7.6* 7.6*  --  9.1*   WBC 21.28* 22.12*  --  16.80* 18.28* 15.50*  --   --  17.37*   Hematocrit 26.2* 28.3*  --  25.7* 25.5* 32.6*  --   More results in Results Review 22.3*   Hemoglobin 8.3* 9.2*  --  8.2* 8.3* 10.5*  --   More results in Results Review 7.0*   More results in Results Review = values in this interval not displayed.                                                                 Nutrition Diagnosis      Severe Malnutrition related to inadequate oral intake in the setting of acute illness as evidenced by 9% weight loss  x 1 month,  moderate muscle wasting (temporalis, pectoralis, deltoid) and moderate fat loss (orbital, buccal, triceps) - active                                                               Intervention     Nutrition recommendation - Please refer to top of note                                                                Monitoring/Evaluation   Monitor PO intake/ nutrition support goals, weights, labs, GI function, medical treatment plan    Goals: pt to receive nutrition    Levin Erp, RDN  Clinical Dietitian PRN         [1]   Allergies  Allergen Reactions    Metrizamide Shortness Of Breath and Nausea Only     Rosuvastatin Other (See Comments)     myopathy    myopathy   myopathy    myopathy myopathy      myopathy    myopathy, myopathy    Pioglitazone      Weight gain    Sulfa Antibiotics Hives     blister

## 2022-08-31 NOTE — Plan of Care (Signed)
Problem: Moderate/High Fall Risk Score >5  Goal: Patient will remain free of falls  Outcome: Progressing     Problem: Compromised Sensory Perception  Goal: Sensory Perception Interventions  Outcome: Progressing     Problem: Compromised Moisture  Goal: Moisture level Interventions  Outcome: Progressing     Problem: Compromised Activity/Mobility  Goal: Activity/Mobility Interventions  Outcome: Progressing     Problem: Compromised Nutrition  Goal: Nutrition Interventions  Outcome: Progressing     Problem: Compromised Friction/Shear  Goal: Friction and Shear Interventions  Outcome: Progressing     Problem: Renal Instability  Goal: Fluid and electrolyte balance are achieved/maintained  Outcome: Progressing  Goal: Nutritional intake is adequate  Outcome: Progressing  Goal: Perineal skin integrity is maintained or improved and remains free from infection  Outcome: Progressing     Problem: Patient Receiving Advanced Renal Therapies  Goal: Therapy access site remains intact  Outcome: Progressing     Problem: Impaired Mobility  Goal: Mobility/Activity is maintained at optimal level for patient  Outcome: Progressing     Problem: Peripheral Neurovascular Impairment  Goal: Extremity color, movement, sensation are maintained or improved  Outcome: Progressing     Problem: Compromised skin integrity  Goal: Skin integrity is maintained or improved  Outcome: Progressing     Problem: Nutrition  Goal: Nutritional intake is adequate  Outcome: Progressing     Problem: Pain interferes with ability to perform ADL  Goal: Pain at adequate level as identified by patient  Outcome: Progressing     Problem: Side Effects from Pain Analgesia  Goal: Patient will experience minimal side effects of analgesic therapy  Outcome: Progressing     Problem: Discharge Barriers  Goal: Patient will be discharged home or other facility with appropriate resources  Outcome: Progressing     Problem: Inadequate Gas Exchange  Goal: Adequate oxygenation and  improved ventilation  Outcome: Progressing  Goal: Patent Airway maintained  Outcome: Progressing     Problem: Hemodynamic Status: Cardiac  Goal: Stable vital signs and fluid balance  Outcome: Progressing     Problem: Pain  Goal: Pain at adequate level as identified by patient  Outcome: Progressing

## 2022-08-31 NOTE — Consults (Signed)
Post-Pyloric ENvue Feeding Tube Placement  ENvue Feeding tube placed:   []  Oroduodenal  []  Orojejunal  []  Orogastric  [x]  Nasoduodenal []  Nasojejunal []  Nasogastric     Tube secured at 74 cm. Stylet removed [x]     Achieved desired location based on:   [x]  Frontal view with tube tip at least traverses the RUQ field   [x]  Lateral view demonstrates posterior drop    [x]  Axial view begins to create an oval shape (complete oval only expected if placed into jejunum)   [x]  Negative pressure achieved with syringe suctioning of feeding tube    Securement Method:   []  Tape   [x]  AMT Nasal Bridle (contact superuser for removal)      [x]  AXR order placed for confirmation  [x]  Feeding tube placement documented in EHR/Avatar  [x]  Notify Pharmacy if post-pyloric positioning for medication administration changes      Feeding tube is ready for use once placement confirmed. For any concerns regarding feeding tube positioning, please call [phone] prior to ordering XR to confirm positioning, equipment can be used to confirm and adjust positioning if needed. If bridle in place, Counselling psychologist [phone], ICU Charge RN, Teacher, English as a foreign language) for removal.    Sela Hua, MS, RD, CNSC  Clinical Dietitian  [phone]

## 2022-08-31 NOTE — Progress Notes (Signed)
INFECTIOUS DISEASE PROGRESS NOTE       Antibiotics:    Zosyn #5  S/p cefazolin, ceftriaxone, vancomycin    Lines:    PIV  Right chest Permacath    Assessment/Plan:    82 y.o. female with DM, ESRD-DD, h/o CVA, RCC s/p left nephrectomy, recently-diagnosed bladder ca s/p radical cystectomy   Recent prolonged hospitalization at [facility]    Admitted from SNF with malfunctioning Permacath  Noted dehiscence of abdominal wound; s/p I&D 08/25/22 with some necrosis/purulence seen  Episode of nausea/vomiting and hypoxic respiratory failure 08/26/22, likely aspiration pneumonia  Persistent leukocytosis; occasional low-grade temps only  Dysphagia; NPO    Aspiration pneumonia - improved hypoxia, though with persistesnt leukocytosis. Continue Zosyn for now.  MRSA screen pending. Aspiration precautions; remains NPO per SLP recommendations. Family planning for NGT placement for nutrition, though will not remove the risk of aspiration. Leukocytosis likely multifactorial with dense consolidation with atelectasis, large abdominal wound, and likely some recurrent silent aspiration.    Abdominal wound - no antibiotics needed per Surgery; continue local wound care.    Discussed with Dr. Cherylin Mylar.    Jillene Bucks, MD ([id number])  [facility]  [phone]    Subjective:    No acute events. Remains responsive but minimally conversant; mostly mumbling/moaning. Denies pain/cough/nausea. Afebrile.      Physical Exam:      Vitals:    08/31/22 1255   BP: 135/57   Pulse: 76   Resp:    Temp:    SpO2: 99%     Vitals reviewed. Afebrile  General: awake, chronically ill-appearing elderly female, in NAD  Heart: RRR  Lungs: diminished bilat bases  Abdomen: soft, nontender, VAC dressing in place  Extremities: no edema  Lines: PIV, right permacath    Labs:    Lab results reviewed.  Recent Labs     08/31/22  0406   WBC 21.28*   Hemoglobin 8.3*   Hematocrit 26.2*   Platelet Count 313     Recent CMP   No results for input(s): "GLU", "BUN",  "CREAT", "NA", "K", "CL", "CO2" in the last 24 hours.      Microbiology:    All microbiology results reviewed.   8/12 mrsa pending    Radiology:    All radiological procedures reviewed.   Radiology Results (24 Hour)       ** No results found for the last 24 hours. **                Jillene Bucks, MD ([id number])   [facility]   [phone]

## 2022-09-01 DIAGNOSIS — Z008 Encounter for other general examination: Secondary | ICD-10-CM

## 2022-09-01 LAB — APTT: PTT: 47 s — ABNORMAL HIGH (ref 27–39)

## 2022-09-01 LAB — BASIC METABOLIC PANEL
Anion Gap: 13 (ref 5.0–15.0)
BUN: 24 mg/dL — ABNORMAL HIGH (ref 7–21)
CO2: 24 mEq/L (ref 17–29)
Calcium: 9.9 mg/dL (ref 7.9–10.2)
Chloride: 100 mEq/L (ref 99–111)
Creatinine: 3.3 mg/dL — ABNORMAL HIGH (ref 0.4–1.0)
GFR: 13.2 mL/min/{1.73_m2} — ABNORMAL LOW (ref >=60.0)
Glucose: 216 mg/dL — ABNORMAL HIGH (ref 70–100)
Potassium: 3.5 mEq/L (ref 3.5–5.3)
Sodium: 137 mEq/L (ref 135–145)

## 2022-09-01 LAB — LAB USE ONLY - CBC WITH DIFFERENTIAL
Absolute Basophils: 0.13 10*3/uL — ABNORMAL HIGH (ref 0.00–0.08)
Absolute Eosinophils: 0.03 10*3/uL (ref 0.00–0.44)
Absolute Immature Granulocytes: 2.08 10*3/uL — ABNORMAL HIGH (ref 0.00–0.07)
Absolute Lymphocytes: 3.74 10*3/uL — ABNORMAL HIGH (ref 0.42–3.22)
Absolute Monocytes: 1.66 10*3/uL — ABNORMAL HIGH (ref 0.21–0.85)
Absolute Neutrophils: 12 10*3/uL — ABNORMAL HIGH (ref 1.10–6.33)
Absolute nRBC: 0.08 10*3/uL — ABNORMAL HIGH (ref <=0.00)
Basophils %: 0.7 %
Eosinophils %: 0.2 %
Hematocrit: 27.3 % — ABNORMAL LOW (ref 34.7–43.7)
Hemoglobin: 8.8 g/dL — ABNORMAL LOW (ref 11.4–14.8)
Immature Granulocytes %: 10.6 %
Lymphocytes %: 19 %
MCH: 30 pg (ref 25.1–33.5)
MCHC: 32.2 g/dL (ref 31.5–35.8)
MCV: 93.2 fL (ref 78.0–96.0)
MPV: 9.4 fL (ref 8.9–12.5)
Monocytes %: 8.5 %
Neutrophils %: 61 %
Platelet Count: 310 10*3/uL (ref 142–346)
Preliminary Absolute Neutrophil Count: 12 10*3/uL — ABNORMAL HIGH (ref 1.10–6.33)
RBC: 2.93 10*6/uL — ABNORMAL LOW (ref 3.90–5.10)
RDW: 17 % — ABNORMAL HIGH (ref 11–15)
WBC: 19.64 10*3/uL — ABNORMAL HIGH (ref 3.10–9.50)
nRBC %: 0.4 /100 WBC — ABNORMAL HIGH (ref <=0.0)

## 2022-09-01 LAB — ECG 12-LEAD
Q-T Interval: 370 ms
Ventricular Rate: 95 {beats}/min

## 2022-09-01 LAB — WHOLE BLOOD GLUCOSE POCT
Whole Blood Glucose POCT: 153 mg/dL — ABNORMAL HIGH (ref 70–100)
Whole Blood Glucose POCT: 214 mg/dL — ABNORMAL HIGH (ref 70–100)
Whole Blood Glucose POCT: 221 mg/dL — ABNORMAL HIGH (ref 70–100)
Whole Blood Glucose POCT: 237 mg/dL — ABNORMAL HIGH (ref 70–100)
Whole Blood Glucose POCT: 245 mg/dL — ABNORMAL HIGH (ref 70–100)

## 2022-09-01 MED ORDER — APIXABAN 2.5 MG PO TABS
2.5000 mg | ORAL_TABLET | Freq: Two times a day (BID) | ORAL | Status: DC
Start: 2022-09-01 — End: 2022-09-03
  Administered 2022-09-01 – 2022-09-03 (×4): 2.5 mg via NASOGASTRIC
  Filled 2022-09-01 (×4): qty 1

## 2022-09-01 MED ORDER — SODIUM CHLORIDE 0.9 % IV BOLUS
100.0000 mL | INTRAVENOUS | Status: AC | PRN
Start: 2022-09-01 — End: 2022-09-01

## 2022-09-01 MED ORDER — METOPROLOL TARTRATE 12.5 MG PO SPLIT TAB
12.5000 mg | ORAL_TABLET | Freq: Two times a day (BID) | ORAL | Status: DC
Start: 2022-09-01 — End: 2022-09-03
  Administered 2022-09-02 (×2): 12.5 mg via NASOGASTRIC
  Filled 2022-09-01 (×3): qty 1

## 2022-09-01 MED ORDER — INSULIN LISPRO 100 UNIT/ML SOLN (WRAP)
1.0000 [IU] | Status: DC
Start: 2022-09-02 — End: 2022-09-03
  Administered 2022-09-02: 3 [IU] via SUBCUTANEOUS
  Administered 2022-09-02 – 2022-09-03 (×7): 2 [IU] via SUBCUTANEOUS
  Administered 2022-09-03: 3 [IU] via SUBCUTANEOUS
  Administered 2022-09-03: 2 [IU] via SUBCUTANEOUS
  Filled 2022-09-01 (×5): qty 6
  Filled 2022-09-01 (×2): qty 9
  Filled 2022-09-01 (×3): qty 6

## 2022-09-01 MED ORDER — ALBUMIN HUMAN 25 % IV SOLN
100.0000 mL | INTRAVENOUS | Status: AC | PRN
Start: 2022-09-01 — End: 2022-09-01
  Administered 2022-09-01: 25 g via INTRAVENOUS
  Filled 2022-09-01: qty 100

## 2022-09-01 MED ORDER — MEGESTROL ACETATE 400 MG/10ML PO SUSP
400.0000 mg | Freq: Every morning | ORAL | Status: DC
Start: 2022-09-02 — End: 2022-09-03
  Administered 2022-09-02 – 2022-09-03 (×2): 400 mg via NASOGASTRIC
  Filled 2022-09-01 (×2): qty 10

## 2022-09-01 MED ORDER — GUAIFENESIN 100 MG/5ML PO LIQD
400.0000 mg | Freq: Three times a day (TID) | ORAL | Status: DC | PRN
Start: 2022-09-01 — End: 2022-09-03
  Administered 2022-09-01 – 2022-09-03 (×3): 400 mg via GASTROSTOMY
  Filled 2022-09-01 (×3): qty 20

## 2022-09-01 MED ORDER — SODIUM CHLORIDE 0.9 % IV BOLUS
250.0000 mL | INTRAVENOUS | Status: AC | PRN
Start: 2022-09-01 — End: 2022-09-01

## 2022-09-01 NOTE — Plan of Care (Signed)
Problem: Moderate/High Fall Risk Score >5  Goal: Patient will remain free of falls  Outcome: Progressing  Flowsheets (Taken 09/01/2022 2000)  High (Greater than 13):   LOW-Fall Interventions Appropriate for Low Fall Risk   LOW-Anticoagulation education for injury risk   HIGH-Consider use of low bed   HIGH-Apply yellow "Fall Risk" arm band   HIGH-Bed alarm on at all times while patient in bed   HIGH-Visual cue at entrance to patient's room   MOD-Place Fall Risk level on whiteboard in room   MOD-Include family in multidisciplinary POC discussions   MOD-Re-orient confused patients     Problem: Compromised Moisture  Goal: Moisture level Interventions  Outcome: Progressing  Flowsheets (Taken 09/01/2022 2000)  Moisture level Interventions: Moisture wicking products, Moisture barrier cream     Problem: Compromised Activity/Mobility  Goal: Activity/Mobility Interventions  Outcome: Progressing  Flowsheets (Taken 09/01/2022 2000)  Activity/Mobility Interventions: Pad bony prominences, TAP Seated positioning system when OOB, Promote PMP, Reposition q 2 hrs / turn clock, Offload heels     Problem: Compromised Friction/Shear  Goal: Friction and Shear Interventions  Outcome: Progressing  Flowsheets (Taken 09/01/2022 2000)  Friction and Shear Interventions: Pad bony prominences, Off load heels, HOB 30 degrees or less unless contraindicated, Consider: TAP seated positioning, Heel foams     Problem: Renal Instability  Goal: Fluid and electrolyte balance are achieved/maintained  Outcome: Progressing  Flowsheets (Taken 09/01/2022 0323)  Fluid and electrolyte balance are achieved/maintained:   Monitor/assess lab values and report abnormal values   Assess and reassess fluid and electrolyte status   Observe for cardiac arrhythmias   Monitor for muscle weakness     Problem: Patient Receiving Advanced Renal Therapies  Goal: Therapy access site remains intact  Outcome: Progressing  Flowsheets (Taken 09/01/2022 2321)  Therapy access site remains  intact: Assess therapy access site     Problem: Impaired Mobility  Goal: Mobility/Activity is maintained at optimal level for patient  Outcome: Progressing  Flowsheets (Taken 09/01/2022 2321)  Mobility/activity is maintained at optimal level for patient: Encourage independent activity per ability     Problem: Pain interferes with ability to perform ADL  Goal: Pain at adequate level as identified by patient  Outcome: Progressing  Flowsheets (Taken 09/01/2022 0323)  Pain at adequate level as identified by patient:   Identify patient comfort function goal   Assess pain on admission, during daily assessment and/or before any "as needed" intervention(s)   Reassess pain within 30-60 minutes of any procedure/intervention, per Pain Assessment, Intervention, Reassessment (AIR) Cycle   Evaluate if patient comfort function goal is met   Evaluate patient's satisfaction with pain management progress   Offer non-pharmacological pain management interventions     Problem: Inadequate Gas Exchange  Goal: Adequate oxygenation and improved ventilation  Outcome: Progressing  Flowsheets (Taken 09/01/2022 0323)  Adequate oxygenation and improved ventilation:   Assess lung sounds   Monitor SpO2 and treat as needed   Position for maximum ventilatory efficiency   Provide mechanical and oxygen support to facilitate gas exchange   Plan activities to conserve energy: plan rest periods   Increase activity as tolerated/progressive mobility     Problem: Hemodynamic Status: Cardiac  Goal: Stable vital signs and fluid balance  Outcome: Progressing  Flowsheets (Taken 09/01/2022 1824 by Esaw Dace, RN)  Stable vital signs and fluid balance: Assess signs and symptoms associated with cardiac rhythm changes     Problem: Pain  Goal: Pain at adequate level as identified by patient  Outcome: Progressing  Flowsheets (Taken 09/01/2022 0323)  Pain at adequate level as identified by patient:   Identify patient comfort function goal   Assess pain  on admission, during daily assessment and/or before any "as needed" intervention(s)   Reassess pain within 30-60 minutes of any procedure/intervention, per Pain Assessment, Intervention, Reassessment (AIR) Cycle   Evaluate if patient comfort function goal is met   Evaluate patient's satisfaction with pain management progress   Offer non-pharmacological pain management interventions

## 2022-09-01 NOTE — Progress Notes (Addendum)
Pt will be d/c today, going to go [facility]: rm 436,  report to [phone]. Bedside RN acknowledged of above information.  The dtr has requested CMS to arrange through "[employer]( [phone]) since they have pt's account already. CM called pt's dtr and updated d/c plan and confirmed transportation the mode of transport: stretcher.     @2 :42 pm. D/C plan was cancelled.      09/01/22 1433   Discharge Disposition   Patient preference/choice provided? Yes   Physical Discharge Disposition LTAC   Receiving facility, unit and room number: [facility] rm 436   Nursing report phone number: [phone]   Mode of Transportation Wheelchair [place]  (with  stretcher)   Pick up time requested 5 pm   Patient/Family/POA notified of transfer plan Yes   Patient agreeable to discharge plan/expected d/c date? Yes   Family/POA agreeable to discharge plan/expected d/c date? Yes   Bedside nurse notified of transport plan? Yes   Special requirements for patient during transport: Oxygen   CM Interventions   Follow up appointment scheduled? No   Reason no follow up scheduled? Family to schedule   Notified MD? Yes   Multidisciplinary rounds/family meeting before d/c? Yes   Medicare Checklist   Is this a Medicare patient? Yes   Date of 2nd IMM Letter 08/29/22     Willow Ora BSN. RN. RRT  Care manager I  [facility]  [phone]. [phone]

## 2022-09-01 NOTE — Plan of Care (Signed)
Problem: Patient Receiving Advanced Renal Therapies  Goal: Therapy access site remains intact  Outcome: Progressing  Flowsheets (Taken 09/01/2022 0820)  Therapy access site remains intact:   Assess therapy access site   Change therapy access site dressing as needed  Note: Dressing changed, clean dry intact

## 2022-09-01 NOTE — Progress Notes (Signed)
Identified pt, dressing changed, clean dry intact, will start with albumin, primary RN aware of dialysis and ok to proceed     09/01/22 0746   Bedside Nurse Communication   Name of bedside RN - pre dialysis Deepika   Treatment Initiation- With Dialysis Precautions   Time Out/Safety Check Completed Yes   Consent for HD signed for this hospitalization (Date) 06/01/22   Consent for HD signed for this hospitalization (Time) 1516   Dialysis Precautions All Connections Secured;Saline Line Double Clamped;Venous Parameters Set;Arterial Parameters Set;Air Foam Detecctor Engaged   Dialysis Treatment Type Routine;Bedside   Special Considerations covid RO   Is patient diabetic? Yes   RO/Hemodialysis Cabin crew   Is Total Chlorine less than 0.1 ppm? Yes   Orignial Total Chlorine Testing Time 0715   RO/Hemodialysis Arts development officer Number 1   RO # 33   pH 7.2   Pressure Test Verified Yes   Alarms Verified Passed   Machine Temperature 98.6 F (37 C)   Alarms Verified Yes   Na+ mEq (Machine) 138 mEq   Bicarb mEq (Machine) 35 mEq   Hemodialysis Conductivity (Machine) 13.8   Hemodialysis Conductivity (Meter) 13.8   Dialyzer Lot Number [id number]   Tubing Lot Number [id number]   RO Machine Log Completed Yes   Hepatitis Status   HBsAg (Antigen) Result Negative   HBsAg Date Drawn 07/31/22   HBsAb (Antibody) Result Reactive   Vitals   Temp 98.5 F (36.9 C)   Heart Rate 84   Resp Rate 22   BP 116/54   SpO2 97 %   O2 Device Nasal cannula   Assessment   Mental Status Lethargic;Cooperative   Cardiac (WDL) WDL   Cardiac Rhythm Normal sinus rhythm   Respiratory  X   Respiratory Pattern Tachypneic   Bilateral Breath Sounds Diminished   Edema  WDL   General Skin Color Appropriate for ethnicity   Skin Temp Warm   Mobility Bed   Permacath Catheter - Tunneled 08/23/22 Subclavian vein catheter Right   Placement Date/Time: (c) 08/23/22 1047   Inserted by: Dr. Bonita Quin  Access Type: Subclavian vein catheter  Orientation: Right   Central Line Infection Prevention Education provided?: Yes  Hand Hygiene: Chlorhexidine;Alcohol based hand scrub;Soap and water  ...   Line necessity reviewed? Apheresis/hemodialysis   Site Assessment Clean;Dry;Intact   Dressing Status and Intervention Removed Dressing;New Dressing;Antibacterial Disc Applied;Clean & Dry   Tego/Curos Caps on Catheter Yes   NEW Tego/Curos Caps placed (Date) 09/01/22   End Caps Free From Blood Yes   Line Care Connections checked and tightened   Dressing Type Transparent;Biopatch   Dressing Status Clean;Dry;Intact   Dressing/Line Intervention Caps changed;Dressing changed   Dressing Change Assistant ([facility] Only) Deepika   Dressing Change Due 09/08/22   Pain Assessment   Charting Type Assessment   Pain Scale Used PAINAD (Advanced Dementia)   Hemodialysis Comments   Pre-Hemodialysis Comments Identified pt   Hemodialysis History Information   Outpatient Dialysis Unit TBD   Chronic Dialysis Patient Yes   Outpatient Nephrologist [name]

## 2022-09-01 NOTE — Progress Notes (Signed)
Dialysis Treatment Note  3.5 hours, 3 k+ 2 liters removed. Hypotensive episodes, albumin, midodrine and ns bolus for bp support. Post bp 128/55. Primary RN aware of dialysis completion. Pt resting, weak cough, post tx    Patient:  Shelby Barron MRN#:  [id number]  Unit/Room/Bed:  J478/G956-21   Isolation: None       Allergies: Allergies[1]  Code Status: Full Code    Problem List:   Patient Active Problem List    Diagnosis Date Noted    Hemodialysis catheter dysfunction, initial encounter 08/21/2022    CKD (chronic kidney disease) requiring chronic dialysis 08/21/2022    Surgical wound breakdown 08/21/2022    Anemia 06/30/2022    COPD (chronic obstructive pulmonary disease) 06/30/2022    Bladder cancer 06/28/2022    Malignant neoplasm of urinary bladder, unspecified site 06/25/2022    Malignant tumor of kidney, left 06/25/2022    GERD (gastroesophageal reflux disease) 06/25/2022    Leukocytosis 06/25/2022    Chronic diastolic congestive heart failure 06/23/2022    Aortic atherosclerosis 06/23/2022    Peritonitis 05/26/2022    ESRD (end stage renal disease) 05/26/2022    Hamstring injury 05/10/2013    Benign essential hypertension 02/10/2013    Left renal mass 11/28/2012    Type 2 diabetes mellitus with kidney complication, with long-term current use of insulin 07/31/2010    Diabetes mellitus type II, uncontrolled 07/31/2010    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       Dialysis Order:  Active Orders   Dialysis    Hemodialysis inpatient     Frequency: Once     Start Date/Time: 09/01/22 3086      Number of Occurrences: 1 Occurrences     Order Questions:      K+ (Initial) 2 mEq      KPP (Per Protocol) Yes      Ca++ 2.5 mEq      Bicarb 35 mEq      Na+ 138 mEq      Dialyzer F160      Dialysate Temperature (C) 37      BFR-As tolerated to a maximum of: 350 mL/min      DFR 700 mL/min      Duration of Treatment 3.5 Hours      Fluid Removal (L) and Dry Weight (Kg) 2L      If available, Nurse may use Crit Line to guide U/F rate Yes         Dialysis Treatment Type:    Time out/Safety Check: Time Out/Safety Check Completed: Yes (09/01/22 0746)  [facility] Consent:    Blood Consent (if applicable): Blood Consent Verified: Yes (08/30/22 0900)    Dialysis Access:           General Assessments:     09/01/22 1146   Treatment Summary   Time Off Machine 1138   Duration of Treatment (Hours) 3.5   Treatment Type 1:1   Dialyzer Clearance Lightly streaked   Fluid Volume Off (mL) 2600   Prime Volume (mL) 200   Rinseback Volume (mL) 200   Fluid Given: Normal Saline (mL) 100   Fluid Given: PRBC  0 mL   Fluid Given: Albumin (mL) 100   Fluid Given: Other (mL) 0   Total Fluid Given 600   Hemodialysis Net Fluid Removed 2000   Post Treatment Assessment   Patient Response to Treatment hypotensive   Information for Next Treatment albumin admin   Additional Dialyzer Used no   Vitals  Temp 98.5 F (36.9 C)   Heart Rate 94   Resp Rate 20   BP 128/55   SpO2 95 %   O2 Device Nasal cannula   Assessment   Mental Status Lethargic   Cardiac (WDL) WDL   Cardiac Rhythm Normal sinus rhythm   Respiratory  X   Respiratory Pattern Irregular   Bilateral Breath Sounds Diminished   Cough Weak;Non-productive   Edema  WDL   General Skin Color Appropriate for ethnicity   Mobility Bed   Pain Assessment   Charting Type Assessment   Pain Scale Used PAINAD (Advanced Dementia)   Bedside Nurse Communication   Name of bedside RN - pre dialysis Deepika   Name of bedside RN - post dialysis Deepika           Pain Assessment: Pain Assessment  Charting Type: Assessment (09/01/22 1146)  Pain Scale Used: PAINAD (Advanced Dementia) (09/01/22 1146)    Labs Values:    HBsAg Result:HBsAg (Antigen) Result: Negative (09/01/22 0746)  HBsAg Date Drawn: HBsAg Date Drawn: 07/31/22 (09/01/22 0746)  HBsAg Next Due Date:HBsAg Repeat Draw Due Date: 07/31/22 (08/30/22 0900)    Lab Results   Component Value Date    BUN 24 (H) 09/01/2022    NA 137 09/01/2022    K 3.5 09/01/2022    CREAT 3.3 (H) 09/01/2022     CO2 24 09/01/2022    CA 9.9 09/01/2022    PHOS 1.9 (L) 08/31/2022    GLU 216 (H) 09/01/2022    ALB 2.4 (L) 08/29/2022    HGB 8.8 (L) 09/01/2022    HCT 27.3 (L) 09/01/2022    WBC 19.64 (H) 09/01/2022    PLT 310 09/01/2022    PTT 47 (H) 09/01/2022    PT 19.8 (H) 08/23/2022    INR 1.7 08/23/2022       Orders Placed This Encounter      Diet NPO effective now Except for: ICE CHIPS (sips by tsp when alert), OTHER (SEE COMMENTS)       RO/Hemodialysis Machine Safety Checks - Before Each Treatment:  RO/Hemodialysis Dance movement psychotherapist Number: 1 (09/01/22 0746)   Machine Serial Number: [id number] (08/30/22 0900)  RO #: 33 (09/01/22 0746)  RO Serial #: [id number] (08/30/22 0900)  Water Hardness: NA (08/30/22 0900)  pH: 7.2 (09/01/22 0746)  Pressure Test Verified: Yes (09/01/22 0746)  Alarms Verified: Passed (09/01/22 0746)  Machine Temperature: 98.6 F (37 C) (09/01/22 0746)  Manual Machine Temperature: 96.8 F (36 C) (08/30/22 0900)  Alarms Verified: Yes (09/01/22 0746)  Na+ mEq (Machine): 138 mEq (09/01/22 0746)  Bicarb mEq (Machine): 35 mEq (09/01/22 0746)  Hemodialysis Conductivity (Machine): 13.8 (09/01/22 0746)  Hemodialysis Conductivity (Meter): 13.8 (09/01/22 0746)  Dialyzer Lot Number: [id number] (09/01/22 0746)  Tubing Lot Number: [id number] (09/01/22 0746)  RO Machine Log Completed: Yes (09/01/22 0746)    Chlorine Testing:  RO/Hemodialysis Machine Safety Checks  Is Total Chlorine less than 0.1 ppm?: Yes (09/01/22 0746)  Orignial Total Chlorine Testing Time: 0715 (09/01/22 0746)  At 4 Hour Total Chlorine Testing Time: 1300 (08/30/22 0900)    Vitals and Weight:  Patient Vitals for the past 4 hrs:   BP Temp Pulse Resp   09/01/22 1146 128/55 98.5 F (36.9 C) 94 20   09/01/22 1133 102/49 -- 93 --   09/01/22 1115 115/65 -- 91 --   09/01/22 1103 104/58 -- 94 --   09/01/22 1045 103/49 -- 89 --   09/01/22 1032 111/51 --  89 --   09/01/22 1019 124/64 -- 93 --   09/01/22 1001 93/51 -- 87 --   09/01/22 0946 98/50 -- 83 --    09/01/22 0931 122/58 -- 86 --   09/01/22 0919 126/59 -- 85 --   09/01/22 0904 118/54 -- 83 --   09/01/22 0846 96/48 -- 82 --   09/01/22 0830 104/51 -- 81 --   09/01/22 0815 109/52 -- 79 --   09/01/22 0808 136/63 -- 78 --        Dialysis Weight  Pre-Treatment Weight (Kg): 81.9 (08/27/22 1420)  Scale Type: N/A (08/30/22 0900)  Post-Treatment Weight (Kg): 79.2 (08/27/22 1800)    Treatment:     09/01/22 1133   Vitals   Heart Rate 93   BP 102/49   SpO2 96 %   Machine Metrics   Blood Flow Rate (mL/min) 350 mL/min   Arterial Pressure (mmHg) -178 mmHg   Venous Pressure (mmHg) 99   Dialysate Flow Rate (mL/min) 700 mL/min   Transmembrane Pressure (mmHg) 36 mmHg   Ultrafiltration Rate (mL/Hr) 740 mL/hr   Fluid Removal (ml) 2485 ml   Hemodialysis Comments   Arteriovenous Lines Secure Yes          09/01/22 1103 09/01/22 1115   Vitals   Heart Rate 94 91   BP 104/58 115/65   SpO2 96 % 96 %   Machine Metrics   Blood Flow Rate (mL/min) 350 mL/min 350 mL/min   Arterial Pressure (mmHg) -176 mmHg -173 mmHg   Venous Pressure (mmHg) 102 109   Dialysate Flow Rate (mL/min) 700 mL/min 700 mL/min   Transmembrane Pressure (mmHg) 38 mmHg 34 mmHg   Ultrafiltration Rate (mL/Hr) 740 mL/hr 740 mL/hr   Fluid Removal (ml) 2123 ml 2274 ml   Hemodialysis Comments   Arteriovenous Lines Secure Yes Yes          09/01/22 1019 09/01/22 1032 09/01/22 1045   Vitals   Heart Rate 93 89 89   BP 124/64 111/51 103/49   SpO2 97 % 96 % 97 %   Machine Metrics   Blood Flow Rate (mL/min) 350 mL/min 350 mL/min 350 mL/min   Arterial Pressure (mmHg) -174 mmHg -177 mmHg -175 mmHg   Venous Pressure (mmHg) 102 96 95   Dialysate Flow Rate (mL/min) 700 mL/min 700 mL/min 700 mL/min   Transmembrane Pressure (mmHg) 39 mmHg 38 mmHg 37 mmHg   Ultrafiltration Rate (mL/Hr) 740 mL/hr 740 mL/hr 740 mL/hr   Fluid Removal (ml) 1588 ml 1751 ml 1908 ml   Hemodialysis Comments   Arteriovenous Lines Secure Yes Yes Yes   Comments no distress no distress MD at bedside          09/01/22 0931  09/01/22 0946 09/01/22 1001   Vitals   Heart Rate 86 83 87   BP 122/58 98/50 93/51    SpO2 97 % 96 % 96 %   Machine Metrics   Blood Flow Rate (mL/min) 350 mL/min 350 mL/min 350 mL/min   Arterial Pressure (mmHg) -177 mmHg -172 mmHg -174 mmHg   Venous Pressure (mmHg) 96 95 98   Dialysate Flow Rate (mL/min) 700 mL/min 700 mL/min 700 mL/min   Transmembrane Pressure (mmHg) 38 mmHg 38 mmHg 38 mmHg   Ultrafiltration Rate (mL/Hr) 740 mL/hr 740 mL/hr 740 mL/hr   Fluid Removal (ml) 1007 ml 1186 ml 1363 ml   Fluid Bolus (ml)  --   --  100 ml   Hemodialysis Comments   Arteriovenous Lines Secure  Yes Yes Yes   Comments no distress  --  ns flushed lines          09/01/22 0846 09/01/22 0904 09/01/22 0919   Vitals   Heart Rate 82 83 85   BP 96/48 118/54 126/59   SpO2 97 % 98 % 97 %   Machine Metrics   Blood Flow Rate (mL/min) 350 mL/min 350 mL/min 350 mL/min   Arterial Pressure (mmHg) -148 mmHg -170 mmHg -171 mmHg   Venous Pressure (mmHg) 110 97 96   Dialysate Flow Rate (mL/min) 700 mL/min 700 mL/min 700 mL/min   Transmembrane Pressure (mmHg) 36 mmHg 36 mmHg 38 mmHg   Ultrafiltration Rate (mL/Hr) 740 mL/hr 740 mL/hr 740 mL/hr   Fluid Removal (ml) 469 ml 675 ml 861 ml   Hemodialysis Comments   Arteriovenous Lines Secure Yes Yes Yes   Comments reversed lines RN at bedside, admin midodrine and a.m. meds Dr [name] rounded at bedside          09/01/22 0808 09/01/22 0815 09/01/22 0830   Vitals   Heart Rate 78 79 81   BP 136/63 109/52 104/51   SpO2 97 % 98 % 97 %   Machine Metrics   $Treatment Started/Capturing Charge Yes  --   --    Blood Flow Rate (mL/min) 200 mL/min 300 mL/min 350 mL/min   Arterial Pressure (mmHg) -76 mmHg -122 mmHg -148 mmHg   Venous Pressure (mmHg) 19 90 113   Dialysate Flow Rate (mL/min) 700 mL/min 700 mL/min 700 mL/min   Transmembrane Pressure (mmHg) 30 mmHg 40 mmHg 36 mmHg   Ultrafiltration Rate (mL/Hr) 740 mL/hr 740 mL/hr 740 mL/hr   Fluid Removal (ml) 0 ml 130 ml 305 ml   Fluid Bolus (ml) 200 ml  --   --     Dialysate K (mEq/L) 3 mEq/L  --   --    Dialysate CA (mEq/L) 2.5 mEq/L  --   --    Hemodialysis Comments   Arteriovenous Lines Secure Yes Yes Yes   Comments treatment started albumin admin pt resting          Intake/Output:  I/O this shift:  In: -   Out: 2000 [Other:2000]      Medications:  Dialysis Medications[2]     Education:  Education  Person taught: Patient (08/30/22 1258)  Knowledge basis: None (08/30/22 1258)  Topics taught: Access care;S&S of infection;Fluid Management;K+;Procedure (08/30/22 1258)  Teaching Tools: Explain;Demo (08/30/22 1258)  Reponse: No Evidence of Learning (08/30/22 1258)    Primary Nurse Communication:  Bedside Nurse Communication  Name of bedside RN - pre dialysis: Deepika (09/01/22 1146)  Name of bedside RN - post dialysis: Deepika (09/01/22 1146)    [place]    Camelia Eng RN                         [1]   Allergies  Allergen Reactions    Metrizamide Shortness Of Breath and Nausea Only    Rosuvastatin Other (See Comments)     myopathy    myopathy   myopathy    myopathy myopathy      myopathy    myopathy, myopathy    Pioglitazone      Weight gain    Sulfa Antibiotics Hives     blister   [2]   Current Facility-Administered Medications   Medication Dose Route Last Rate Last Admin    albumin human 25 % bag 25 g  100  mL Intravenous   25 g at 09/01/22 0809    sodium chloride 0.9 % bolus 100 mL  100 mL Intravenous        sodium chloride 0.9 % bolus 250 mL  250 mL Intravenous

## 2022-09-01 NOTE — Progress Notes (Signed)
82 y.o. female with PMH AF on Eliquis, chronic HFpEF, HTN, HLD, h/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, [name], RCC s/p bilateral nephrectomy, urothelial CA s/p cystectomy c/b small bowel enterotomy s/p small bowel anastomosis and abdominal closure requiring wound vac, anemia of chronic disease, COPD, h/o T2DM (last A1c 5.6 in June 2024), dysphagia, gout, admitted with malfunctioning HD catheter with need for HD and non-healing abdominal surgical wound with course complicated by acute hypoxic respiratory failure 2/to aspiration pneumonia and pulmonary edema.    Notified by RN of consistently elevated blood glucose while on tube feedings  Patient has prior h/o T2DM with A1c 8.5 in May 2024 though most recent A1c 5.6 in June 2024 likely due to poor oral intake causing severe protein calorie malnutrition   Will start low dose PSSI q4h while on tube feedings       , FNP-BC, AGACNP-BC

## 2022-09-01 NOTE — Progress Notes (Signed)
Discussed plan with primary RN and CM, pt planned for [place] today.  NPWT dressing to be removed in its entirety prior to discharge and replaced with Vashe moistened gauze dressing covered with sacral foam.

## 2022-09-01 NOTE — Progress Notes (Signed)
[facility] PROGRESS NOTE  [id number]      Date Time: 09/01/22 9:38 AM  Patient Name: Shelby Barron  Attending Physician: Dionicia Abler, MD    CC: follow-up ESRD  HD Note  Assessment:   [name] MWF via [facility]  Malfunctioning permcath - s/p replacement 08/23/22 and now functioning without issues  Anemia due to CKD - Hgb below goal and ferritin elevated  Deconditioning s/p prolonged hospitalization at [facility]   Abdominal wound s/p debridement and irrigation and remains with wound vac in place   Aspiration PNA - on abx, worsening leucocytosis    Recommendations:     Tolerating HD: Current Rx =   Dialysis Treatment Data:  Blood flow rate: 350 mL/min  Dialysate flow rate: 700 mL/min  Ultrafiltration rate: 740 mL/hr    Dialysate potassium: 3 mEq/L  Dialysate calcium: 2.5 mEq/L    Arterial pressure: -177 mmHg  Venous pressure:   On ESA - monitor H/H  Hold Sevelamer - check phos with labs tomorrow  On Zosyn  Continue midodrine PRN      Case discussed with: patient      Glenard Haring, MD  [facility]  [phone] (office)      Subjective:   Seen on HD.  Less responsive.  Appears comfortable.      Physical Exam:     Vitals:    09/01/22 0846 09/01/22 0904 09/01/22 0919 09/01/22 0931   BP: 96/48 118/54 126/59 122/58   Pulse: 82 83 85 86   Resp:       Temp:       TempSrc:       SpO2: 97% 98% 97% 97%   Weight:       Height:           Intake and Output Summary (Last 24 hours) at Date Time    Intake/Output Summary (Last 24 hours) at 09/01/2022 0938  Last data filed at 09/01/2022 0144  Gross per 24 hour   Intake --   Output 800 ml   Net -800 ml           General: no acute distress, arouses to voice  Cardiovascular: S1 and S2, no MRG  Lungs: clear to auscultation anteriorly with diminished respiratory effort  Abdomen: soft, dressing in place  Extremities:No LE edema  RUE with ecchymoses  R TDC    Meds:      Scheduled Meds: PRN Meds:    amiodarone, 200 mg, per NG tube, Daily  atorvastatin, 40  mg, per NG tube, Daily  b complex-vitamin c-folic acid, 1 tablet, Oral, Daily  cinacalcet, 30 mg, per NG tube, QPM  darbepoetin alfa, 0.45 mcg/kg, Subcutaneous, Weekly  megestrol, 400 mg, per NG tube, BID Meals  metoprolol tartrate, 2.5 mg, Intravenous, 4 times per day  midodrine, 5 mg, per NG tube, BID Meals  montelukast, 10 mg, per NG tube, QHS  oxybutynin XL, 5 mg, Oral, Daily  pantoprazole, 40 mg, Oral, Daily  petrolatum, , Topical, Q12H SCH  piperacillin-tazobactam, 2.25 g, Intravenous, Q12H  umeclidinium-vilanterol, 1 puff, Inhalation, QAM           sodium chloride, , PRN  acetaminophen, 650 mg, Q4H PRN  albumin human, 100 mL, PRN  benzocaine-menthol, 1 lozenge, Q2H PRN  benzonatate, 100 mg, TID PRN  carboxymethylcellulose sodium, 1 drop, TID PRN  dextrose, 15 g of glucose, PRN   Or  dextrose, 12.5 g, PRN   Or  dextrose, 12.5 g, PRN  Or  glucagon (rDNA), 1 mg, PRN  heparin (porcine), 3,000 Units, PRN  lidocaine 2% jelly, , Once PRN  melatonin, 3 mg, QHS PRN  morphine, 2 mg, Q4H PRN  naloxone, 0.2 mg, PRN  ondansetron, 4 mg, Q8H PRN  oxyCODONE, 5 mg, Q6H PRN  polyethylene glycol, 17 g, Daily PRN  saline, 2 spray, Q4H PRN  sodium chloride, 100 mL, Q1H PRN  sodium chloride, 250 mL, PRN              Labs:     Recent Labs   Lab 09/01/22  0433 08/31/22  0406 08/30/22  0549   WBC 19.64* 21.28* 22.12*   Hemoglobin 8.8* 8.3* 9.2*   Hematocrit 27.3* 26.2* 28.3*   Platelet Count 310 313 337     Recent Labs   Lab 09/01/22  0433 08/31/22  0406 08/30/22  0549 08/29/22  0345 08/28/22  0341 08/27/22  0346   Sodium 137  --  139 142 142 142   Potassium 3.5  --  3.9 3.3* 3.7 4.2   Chloride 100  --  103 104 104 104   CO2 24  --  23 25 23 22    BUN 24*  --  36* 27* 17 36*   Creatinine 3.3*  --  4.9* 4.2* 3.4* 5.2*   Calcium 9.9  --  9.5 10.2 10.0 9.2   Albumin  --   --   --  2.4* 3.2* 2.0*   Phosphorus  --  1.9*  --  2.9 2.3 3.9   Magnesium  --  1.9 1.9 1.9 1.9 1.8   Glucose 216*  --  93 72 111* 158*   GFR 13.2*  --  8.2* 9.9*  12.7* 7.6*             Invalid input(s): "LEUKOCYTESUR"        Imaging personally reviewed, including: XR Abdomen Portable    Result Date: 08/31/2022  Feeding tube in stomach Kristine Linea, MD 08/31/2022 6:01 PM         Signed by: Glenard Haring, MD

## 2022-09-01 NOTE — Plan of Care (Signed)
NGT tube feeds started at beginning of shift following CXR verification, pt tolerating well, HOB 35 degrees, no complaints of nausea.     Problem: Moderate/High Fall Risk Score >5  Goal: Patient will remain free of falls  Outcome: Progressing  Flowsheets (Taken 08/31/2022 2030)  High (Greater than 13):   LOW-Fall Interventions Appropriate for Low Fall Risk   LOW-Anticoagulation education for injury risk   HIGH-Consider use of low bed   HIGH-Apply yellow "Fall Risk" arm band   HIGH-Bed alarm on at all times while patient in bed   MOD-Place Fall Risk level on whiteboard in room   MOD-Re-orient confused patients   MOD-Request PT/OT consult order for patients with gait/mobility impairment     Problem: Compromised Moisture  Goal: Moisture level Interventions  Outcome: Progressing  Flowsheets (Taken 08/31/2022 2030)  Moisture level Interventions: Moisture wicking products, Moisture barrier cream     Problem: Compromised Activity/Mobility  Goal: Activity/Mobility Interventions  Outcome: Progressing  Flowsheets (Taken 08/31/2022 2030)  Activity/Mobility Interventions: Pad bony prominences, TAP Seated positioning system when OOB, Promote PMP, Reposition q 2 hrs / turn clock, Offload heels     Problem: Compromised Friction/Shear  Goal: Friction and Shear Interventions  Outcome: Progressing  Flowsheets (Taken 08/31/2022 2030)  Friction and Shear Interventions: Pad bony prominences, Off load heels, HOB 30 degrees or less unless contraindicated, Consider: TAP seated positioning, Heel foams     Problem: Renal Instability  Goal: Fluid and electrolyte balance are achieved/maintained  Outcome: Progressing  Flowsheets (Taken 09/01/2022 0323)  Fluid and electrolyte balance are achieved/maintained:   Monitor/assess lab values and report abnormal values   Assess and reassess fluid and electrolyte status   Observe for cardiac arrhythmias   Monitor for muscle weakness     Problem: Patient Receiving Advanced Renal Therapies  Goal: Therapy  access site remains intact  Outcome: Progressing  Flowsheets (Taken 09/01/2022 0323)  Therapy access site remains intact:   Assess therapy access site   Change therapy access site dressing as needed     Problem: Impaired Mobility  Goal: Mobility/Activity is maintained at optimal level for patient  Outcome: Progressing  Flowsheets (Taken 09/01/2022 0323)  Mobility/activity is maintained at optimal level for patient: Encourage independent activity per ability     Problem: Pain interferes with ability to perform ADL  Goal: Pain at adequate level as identified by patient  Outcome: Progressing  Flowsheets (Taken 09/01/2022 0323)  Pain at adequate level as identified by patient:   Identify patient comfort function goal   Assess pain on admission, during daily assessment and/or before any "as needed" intervention(s)   Reassess pain within 30-60 minutes of any procedure/intervention, per Pain Assessment, Intervention, Reassessment (AIR) Cycle   Evaluate if patient comfort function goal is met   Evaluate patient's satisfaction with pain management progress   Offer non-pharmacological pain management interventions     Problem: Inadequate Gas Exchange  Goal: Adequate oxygenation and improved ventilation  Outcome: Progressing  Flowsheets (Taken 09/01/2022 0323)  Adequate oxygenation and improved ventilation:   Assess lung sounds   Monitor SpO2 and treat as needed   Position for maximum ventilatory efficiency   Provide mechanical and oxygen support to facilitate gas exchange   Plan activities to conserve energy: plan rest periods   Increase activity as tolerated/progressive mobility     Problem: Hemodynamic Status: Cardiac  Goal: Stable vital signs and fluid balance  Outcome: Progressing  Flowsheets (Taken 08/31/2022 0243 by Barth Kirks, RN)  Stable vital signs and fluid  balance:   Assess signs and symptoms associated with cardiac rhythm changes   Monitor lab values     Problem: Pain  Goal: Pain at adequate level as identified  by patient  Outcome: Progressing  Flowsheets (Taken 09/01/2022 0323)  Pain at adequate level as identified by patient:   Identify patient comfort function goal   Assess pain on admission, during daily assessment and/or before any "as needed" intervention(s)   Reassess pain within 30-60 minutes of any procedure/intervention, per Pain Assessment, Intervention, Reassessment (AIR) Cycle   Evaluate if patient comfort function goal is met   Evaluate patient's satisfaction with pain management progress   Offer non-pharmacological pain management interventions

## 2022-09-01 NOTE — Progress Notes (Addendum)
[facility]  HOSPITALIST  PROGRESS NOTE      Patient: Shelby Barron  Date: 09/01/2022   LOS: 10 Days  Admission Date: 08/21/2022   MRN: [id number]  Attending:  Dionicia Abler, MD     ASSESSMENT/PLAN     Shelby Barron is a 82 y.o. female with PMH AF on Eliquis, chronic HFpEF, HTN, HLD, h/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, [name], RCC s/p bilateral nephrectomy, urothelial CA s/p cystectomy c/b small bowel enterotomy s/p small bowel anastomosis and abdominal closure requiring wound vac, anemia of chronic disease, COPD, h/o T2DM (last A1c 5.6 in June 2024), dysphagia, gout, admitted with malfunctioning HD catheter with need for HD and non-healing abdominal surgical wound.    Acute Hypoxic Respiratory Failure  Aspiration PNA (acute)  Volume overload  Acute Pulmonary edema   - Acute episode of nausea and vomiting, followed by pt becoming increasingly hypoxemic (08/27/22)  - Currently requiring 2L O2, at max was on 8L O2  - Telemetry monitoring. Continuous pulse ox.  - CXR 8/9 noted, RLL infiltrate  - DDimer 1.97. CT Angio Chest negative for PE, see report below.  - BNP was elevated, more fluid removed with HD  - Continue on IV Zosyn (renal dosing) till 09/05/22 per ID recs   - Strict NPO. SLP following. Patient continues to fail swallow eval    - Palliative Care has seen this pt in past admissions, most recently in July 2024. Family not interested in palliative care or hospice  - Daughters want to continue aggressive measures, including artifical nutrition. Daughters want to proceed with feeding tube-Nutrition placed nasal bridle on 08/31/22 Cannot do PEG at this time due to extensive abd surgeries, and extensive wound with wound vac on abdomen  - Patient will need LTAC placement due to higher level of nursing care she needs    Paroxysmal A-Fib  - Given high aspiration risk, pt unable to safely take po meds   - Discontinued home po Coreg. Discontinue IV Metoprolol 2.5 mg every 6 hours. Start Lopressor 12.5 mg  PO BID  - Started on Amiodarone gtt per [facility] recs, received only a couple of hours of gtt prior to converting to SR, then it was discontinued 08/27/12  - Continue home Amiodarone when able to take po meds  - Discontinue Heparin gtt, restart Eliquis   - If Hg is trending down to < 8 grams, will need to consider stopping anticoagulation  - Cardiology consulted, [facility], appreciate recommendations.    Non-healing abdominal surgical wound s/p I&D 08/25/2022 by Dr. Morton Stall bowel enterotomy s/p small bowel anastomosis, abd closure, wound vac  - SIRS present on admission with persistent leukocytosis, tachycardia  - Appreciate Surgery and WOCN consult and recommendations after malfunctioning wound vac removed  - In OR, large mass of necrotic fat overlying pus pocket incised and drained, no cultures  - Received IV Vanco and CTX, discontinued, d/w Dr. Zollie Beckers VSA 08/27/22, no abx needed   - Maintain wound vac    Acute blood loss anemia 2/to abdominal incision and drainage  Anemia of chronic kidney disease, stable  Iron deficiency anemia  Thrombocytosis, suspect reactive  - Hb dropped to 7, was given 1u PRBC 08/26/22 with good response   - No active signs of bleeding  - Iron studies noted  - On Aranesp per Nephro  - Monitor CBC  - If Hg is trending down to < 8 grams, will need to consider stopping anticoagulation  Malfunctioning HD Catheter  [name] MWF  H/o RCC s/p bilateral nephrectomy  Urothelial CA s/p cystectomy  - Appreciate IR and [facility] consult and recommendations  - S/p Permacath exchange by IR 08/23/22  - Continue MWF HD schedule  - Continue Midodrine for hypotension (hold for SBP>120)    Chronic HFpEF  HTN  HLD  H/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  - Discontinued po Carvedilol (npo status) --> Change IV Metoprolol every 6 hours to Lopressor through NGT  - Continue home Amiodarone   - Continue home Atorvastatin   - Discontinue Heparin gtt, transition to Eliquis today      Deconditioned state s/p prolonged hospitalisation  - PT/OT recommend SNF. Patient likely will need LTAC placement given multiple issues and frequent nursing care including frequent suctioning of secretions   - Fall precautions, skin precautions  - CM working on safe disposition    Severe Protein Calorie Malnutrition related to inadequate oral intake in the setting of acute illness as evidenced by 9% weight loss x 1 month,  moderate muscle wasting (temporalis, pectoralis, deltoid) and moderate fat loss (orbital, buccal, triceps)   - Appreciate Nutrition consult and recommendations  - Daughters want to proceed with feeding tube placement     Obesity Class I, BMI 30  - Lifestyle modifications impractical for largely bed-bound patient    Providing daily updates to patient's daughter [name] on phone    Analgesia: Tylenol prn     FEN  Fluids: SL  Nutrition: Strict NPO since 08/27/22    Monitoring:  Telemetry needed: Yes  Continuous pulse oximetry needed: Yes  Level of Care: 3    Safety Checklist  DVT prophylaxis: Chemical   Foley: Not present   IVs:  Peripheral IV   PT/OT: Not needed   Daily CBC & or Chem ordered: Yes, due to clinical and lab instability       Patient Lines/Drains/Airways Status       Active PICC Line / CVC Line / PIV Line / Drain / Airway / Intraosseous Line / Epidural Line / ART Line / Line / Wound / Pressure Ulcer / NG/OG Tube       Name Placement date Placement time Site Days    Permacath Catheter - Tunneled 07/14/22 Subclavian vein catheter Right 07/14/22  1404  -- 38    Peripheral IV 20 G Standard Anterior;Right Wrist --  --  Wrist  --    Wound 06/30/22 Surgical Incision Abdomen Other (Comment) WOUND VAC applied 06/30/22  1152  Abdomen  52    Wound 07/23/22 Skin Tear Buttocks;Coccyx Inner R and L cheek, coccyx 07/23/22  --  Buttocks;Coccyx  30                       Code Status: full code    DISPO: likely will need LTAC on discharge, most likely tomorrow.     Family Contact: daughter     Care Plan  discussed with nursing, consultants, Dr. Delsa Grana at [facility].        SUBJECTIVE     Patient seen and examined. Non verbal. No acute overnight events.     MEDICATIONS     Current Facility-Administered Medications   Medication Dose Route Frequency    amiodarone  200 mg per NG tube Daily    apixaban  2.5 mg per NG tube Q12H [facility]    atorvastatin  40 mg per NG tube Daily    b  complex-vitamin c-folic acid  1 tablet Oral Daily    cinacalcet  30 mg per NG tube QPM    darbepoetin alfa  0.45 mcg/kg Subcutaneous Weekly    [START ON 09/02/2022] megestrol  400 mg per NG tube QAM W/BREAKFAST    metoprolol tartrate  12.5 mg per NG tube Q12H [facility]    midodrine  5 mg per NG tube BID Meals    montelukast  10 mg per NG tube QHS    oxybutynin XL  5 mg Oral Daily    pantoprazole  40 mg Oral Daily    petrolatum   Topical Q12H SCH    piperacillin-tazobactam  2.25 g Intravenous Q12H    umeclidinium-vilanterol  1 puff Inhalation QAM       ROS     All other systems not mentioned in subjective reviewed and negative - unable to obtain    PHYSICAL EXAM     Vitals:    09/01/22 1419   BP: 121/66   Pulse:    Resp: (!) 26   Temp: 98.5 F (36.9 C)   SpO2:        Temperature: Temp  Min: 97.6 F (36.4 C)  Max: 99.1 F (37.3 C)  Pulse: Pulse  Min: 72  Max: 94  Respiratory: Resp  Min: 17  Max: 26  Non-Invasive BP: BP  Min: 93/51  Max: 167/68  Pulse Oximetry SpO2  Min: 94 %  Max: 98 %    Intake and Output Summary (Last 24 hours) at Date Time    Intake/Output Summary (Last 24 hours) at 09/01/2022 1435  Last data filed at 09/01/2022 1146  Gross per 24 hour   Intake --   Output 2800 ml   Net -2800 ml       GEN APPEARANCE: Elderly, ill appearing, lethargic, non-verbal. Opens eyes to voice   HEENT: NC/AT; Conjunctiva Clear, Sclerae Anicteric; MM dry. + oral secretions   NECK: Supple  CVS: Irreg, Irreg, S1, S2; No Murmur  LUNGS: Bibasilar Rales, coarse breath sounds  ABD: Soft; ND; generalized TTP; Large non-healing abdominal surgical wound/incision with wound vac in  place post-operatively  EXT: No LE edema  SKIN: dry skin  NEURO: Nonverbal and not following commands on exam; opens eyes to voice. Neuro exam limited     LABS     Recent Labs   Lab 09/01/22  0433 08/31/22  0406 08/30/22  0549   WBC 19.64* 21.28* 22.12*   RBC 2.93* 2.83* 3.12*   Hemoglobin 8.8* 8.3* 9.2*   Hematocrit 27.3* 26.2* 28.3*   MCV 93.2 92.6 90.7   Platelet Count 310 313 337       Recent Labs   Lab 09/01/22  0433 08/31/22  0406 08/30/22  0549 08/29/22  0345 08/28/22  0341 08/27/22  0346   Sodium 137  --  139 142 142 142   Potassium 3.5  --  3.9 3.3* 3.7 4.2   Chloride 100  --  103 104 104 104   CO2 24  --  23 25 23 22    BUN 24*  --  36* 27* 17 36*   Creatinine 3.3*  --  4.9* 4.2* 3.4* 5.2*   Glucose 216*  --  93 72 111* 158*   Calcium 9.9  --  9.5 10.2 10.0 9.2   Magnesium  --  1.9 1.9 1.9 1.9 1.8       Recent Labs   Lab 08/29/22  0345 08/28/22  0341 08/27/22  0346  Albumin 2.4* 3.2* 2.0*       Recent Labs   Lab 09/01/22  0433 08/31/22  2115 08/31/22  0406   PTT 47* 50* 51*     RADIOLOGY     Radiological Procedure personally reviewed and concur with radiologist reports unless stated otherwise.    XR Abdomen Portable   Final Result         Feeding tube in stomach      Kristine Linea, MD   08/31/2022 6:01 PM      CT Angiogram Chest   Final Result         1. No pulmonary embolism.   2. Emphysema. Bibasilar lung consolidation.   3. Coronary calcification. Mild cardiomegaly.   4. Moderate hiatal hernia.   5. Ascites. Left posterior lower chest wall/abdominal wall hernia noted.      Nelta Numbers, MD   08/27/2022 6:56 PM      XR Chest AP Portable   Final Result         1. Increasing airspace opacity in the right lower lobe.   2. Stable pulmonary edema pattern.      Colonel Bald, MD   08/27/2022 6:47 AM      XR Chest AP Portable   Final Result      Mild interstitial edema.      Shelly Flatten, MD   08/26/2022 6:19 PM      Tunneled Cath Check/Change (Permcath)   Final Result      Right-sided PermCath exchange with  new 14.5 French 19 cm PermCath with tip   in the right atrium.      PLAN:    Catheter ready for immediate use.      Darci Current, MD   08/23/2022 12:49 PM      XR Chest  AP Portable   Final Result      No significant change mild pulmonary vascular congestion and mild basal   atelectasis. Hiatal hernia.      Clide Cliff, MD   08/21/2022 2:35 PM          Signed,  Dionicia Abler, MD  2:35 PM 09/01/2022

## 2022-09-01 NOTE — Progress Notes (Signed)
INFECTIOUS DISEASE PROGRESS NOTE       Antibiotics:    Zosyn #6  S/p cefazolin, ceftriaxone, vancomycin    Lines:    PIV  Right chest Permacath    Assessment/Plan:    82 y.o. female with DM, ESRD-DD, h/o CVA, RCC s/p left nephrectomy, recently-diagnosed bladder ca s/p radical cystectomy   Recent prolonged hospitalization at [facility]    Admitted from SNF with malfunctioning Permacath  Noted dehiscence of abdominal wound; s/p I&D 08/25/22 with some necrosis/purulence seen  Episode of nausea/vomiting and hypoxic respiratory failure 08/26/22, likely aspiration pneumonia  Persistent leukocytosis; occasional low-grade temps only  Dysphagia; NPO    Aspiration pneumonia - improved hypoxia, decreasing leukocytosis. Continue Zosyn for a 10-day course given her slow response/extensive consolidations; EOT 8/18/204.  MRSA screen negative. Aspiration precautions; remains NPO per SLP recommendations; NGT placed for nutrition.  Leukocytosis likely multifactorial with dense consolidation with atelectasis, large abdominal wound, and likely some recurrent silent aspiration. Plan is for d/c to LTAC.    Abdominal wound - no antibiotics needed per Surgery; continue local wound care.    Discussed with Dr. Eula Listen.    Jillene Bucks[place] MD ([id number])  [facility]  [phone]    Subjective:    No acute events. Remains responsive but minimally conversant. Denies nausea/vomiting/diarrhea, chest pain, cough. Remains on 2L NC.  Afebrile.     Physical Exam:      Vitals:    09/01/22 1446   BP: 119/69   Pulse: 95   Resp: 18   Temp: 99.3 F (37.4 C)   SpO2: 100%     Vitals reviewed. Afebrile  General: awake, chronically ill-appearing elderly female, in NAD  Heart: RRR  Lungs: diminished bilat bases  Abdomen: soft, nontender, VAC dressing in place  Extremities: no edema  Lines: PIV, right permacath    Labs:    Lab results reviewed.  Recent Labs     09/01/22  0433   WBC 19.64*   Hemoglobin 8.8*   Hematocrit 27.3*   Platelet Count 310      Recent CMP   Recent Labs     09/01/22  0433   Glucose 216*   BUN 24*   Creatinine 3.3*   Sodium 137   Potassium 3.5   Chloride 100   CO2 24         Microbiology:    All microbiology results reviewed.   8/12 mrsa negative    Radiology:    All radiological procedures reviewed.   Radiology Results (24 Hour)       Procedure Component Value Units Date/Time    XR Abdomen Portable [[id number]] Collected: 08/31/22 1801    Order Status: Completed Updated: 08/31/22 1803    Narrative:      HISTORY: Feeding tube placement    Portable supine view of the abdomen shows feeding tube to terminate in the  stomach.      Impression:          Feeding tube in stomach    Kristine Linea, MD  08/31/2022 6:01 PM                Jillene Bucks, MD ([id number])   [facility]   [phone]

## 2022-09-01 NOTE — Plan of Care (Signed)
Wound Vac removed and dressing done as ordered. Heparin drip Discontinued. Plan for discharge tomorrow.       Problem: Compromised Sensory Perception  Goal: Sensory Perception Interventions  Outcome: Progressing  Flowsheets (Taken 09/01/2022 1824)  Sensory Perception Interventions: Offload heels, Pad bony prominences, Reposition q 2hrs/turn Clock, Q2 hour skin assessment under devices if present     Problem: Compromised Moisture  Goal: Moisture level Interventions  Outcome: Progressing  Flowsheets (Taken 09/01/2022 1824)  Moisture level Interventions: Moisture wicking products, Moisture barrier cream     Problem: Compromised Friction/Shear  Goal: Friction and Shear Interventions  Outcome: Progressing  Flowsheets (Taken 09/01/2022 1824)  Friction and Shear Interventions: Pad bony prominences, Off load heels, HOB 30 degrees or less unless contraindicated, Consider: TAP seated positioning, Heel foams     Problem: Renal Instability  Goal: Nutritional intake is adequate  Outcome: Progressing  Flowsheets (Taken 09/01/2022 1824)  Nutritional intake is adequate:   Consult/collaborate with Clinical Nutritionist   Consult/collaborate with Speech Therapy (swallow evaluations)     Problem: Compromised skin integrity  Goal: Skin integrity is maintained or improved  Outcome: Progressing  Flowsheets (Taken 09/01/2022 1824)  Skin integrity is maintained or improved:   Assess Braden Scale every shift   Increase activity as tolerated/progressive mobility   Turn or reposition patient every 2 hours or as needed unless able to reposition self   Relieve pressure to bony prominences   Encourage use of lotion/moisturizer on skin   Keep skin clean and dry   Collaborate with Wound, Ostomy, and Continence Nurse   Utilize specialty bed   Monitor patient's hygiene practices   Avoid shearing   Keep head of bed 30 degrees or less (unless contraindicated)     Problem: Nutrition  Goal: Nutritional intake is adequate  Outcome: Progressing  Flowsheets  (Taken 09/01/2022 1824)  Nutritional intake is adequate:   Consult/collaborate with Clinical Nutritionist   Consult/collaborate with Speech Therapy (swallow evaluations)     Problem: Hemodynamic Status: Cardiac  Goal: Stable vital signs and fluid balance  Outcome: Progressing  Flowsheets (Taken 09/01/2022 1824)  Stable vital signs and fluid balance: Assess signs and symptoms associated with cardiac rhythm changes

## 2022-09-02 DIAGNOSIS — R7989 Other specified abnormal findings of blood chemistry: Secondary | ICD-10-CM

## 2022-09-02 LAB — LAB USE ONLY - CBC WITH DIFFERENTIAL
Absolute Basophils: 0.13 10*3/uL — ABNORMAL HIGH (ref 0.00–0.08)
Absolute Eosinophils: 0.05 10*3/uL (ref 0.00–0.44)
Absolute Immature Granulocytes: 2.9 10*3/uL — ABNORMAL HIGH (ref 0.00–0.07)
Absolute Lymphocytes: 3.05 10*3/uL (ref 0.42–3.22)
Absolute Monocytes: 1.55 10*3/uL — ABNORMAL HIGH (ref 0.21–0.85)
Absolute Neutrophils: 12.21 10*3/uL — ABNORMAL HIGH (ref 1.10–6.33)
Absolute nRBC: 0.05 10*3/uL — ABNORMAL HIGH (ref <=0.00)
Basophils %: 0.7 %
Eosinophils %: 0.3 %
Hematocrit: 27.8 % — ABNORMAL LOW (ref 34.7–43.7)
Hemoglobin: 8.8 g/dL — ABNORMAL LOW (ref 11.4–14.8)
Immature Granulocytes %: 14.6 %
Lymphocytes %: 15.3 %
MCH: 29.4 pg (ref 25.1–33.5)
MCHC: 31.7 g/dL (ref 31.5–35.8)
MCV: 93 fL (ref 78.0–96.0)
MPV: 9.7 fL (ref 8.9–12.5)
Monocytes %: 7.8 %
Neutrophils %: 61.3 %
Platelet Count: 296 10*3/uL (ref 142–346)
Preliminary Absolute Neutrophil Count: 12.21 10*3/uL — ABNORMAL HIGH (ref 1.10–6.33)
RBC: 2.99 10*6/uL — ABNORMAL LOW (ref 3.90–5.10)
RDW: 17 % — ABNORMAL HIGH (ref 11–15)
WBC: 19.89 10*3/uL — ABNORMAL HIGH (ref 3.10–9.50)
nRBC %: 0.3 /100 WBC — ABNORMAL HIGH (ref <=0.0)

## 2022-09-02 LAB — WHOLE BLOOD GLUCOSE POCT
Whole Blood Glucose POCT: 219 mg/dL — ABNORMAL HIGH (ref 70–100)
Whole Blood Glucose POCT: 223 mg/dL — ABNORMAL HIGH (ref 70–100)
Whole Blood Glucose POCT: 227 mg/dL — ABNORMAL HIGH (ref 70–100)
Whole Blood Glucose POCT: 236 mg/dL — ABNORMAL HIGH (ref 70–100)
Whole Blood Glucose POCT: 240 mg/dL — ABNORMAL HIGH (ref 70–100)
Whole Blood Glucose POCT: 245 mg/dL — ABNORMAL HIGH (ref 70–100)
Whole Blood Glucose POCT: 254 mg/dL — ABNORMAL HIGH (ref 70–100)
Whole Blood Glucose POCT: 269 mg/dL — ABNORMAL HIGH (ref 70–100)

## 2022-09-02 LAB — ECG 12-LEAD
Atrial Rate: 90 {beats}/min
Atrial Rate: 94 {beats}/min
Atrial Rate: 95 {beats}/min
IHS MUSE NARRATIVE AND IMPRESSION: NORMAL
IHS MUSE NARRATIVE AND IMPRESSION: NORMAL
IHS MUSE NARRATIVE AND IMPRESSION: NORMAL
P Axis: 14 degrees
P Axis: 33 degrees
P Axis: 35 degrees
P-R Interval: 164 ms
P-R Interval: 166 ms
P-R Interval: 168 ms
Q-T Interval: 360 ms
Q-T Interval: 390 ms
QRS Duration: 80 ms
QRS Duration: 84 ms
QRS Duration: 84 ms
QTC Calculation (Bezet): 450 ms
QTC Calculation (Bezet): 464 ms
QTC Calculation (Bezet): 477 ms
R Axis: -24 degrees
R Axis: -27 degrees
R Axis: -34 degrees
T Axis: 17 degrees
T Axis: 20 degrees
T Axis: 25 degrees
Ventricular Rate: 90 {beats}/min
Ventricular Rate: 94 {beats}/min

## 2022-09-02 LAB — HIGH SENSITIVITY TROPONIN-I WITH DELTA
hs Troponin-I Delta: 44
hs Troponin: 278 ng/L (ref <=14.0)

## 2022-09-02 LAB — BASIC METABOLIC PANEL
Anion Gap: 10 (ref 5.0–15.0)
BUN: 21 mg/dL (ref 7–21)
CO2: 26 mEq/L (ref 17–29)
Calcium: 9.7 mg/dL (ref 7.9–10.2)
Chloride: 100 mEq/L (ref 99–111)
Creatinine: 2.4 mg/dL — ABNORMAL HIGH (ref 0.4–1.0)
GFR: 19.3 mL/min/{1.73_m2} — ABNORMAL LOW (ref >=60.0)
Glucose: 235 mg/dL — ABNORMAL HIGH (ref 70–100)
Potassium: 3.5 mEq/L (ref 3.5–5.3)
Sodium: 136 mEq/L (ref 135–145)

## 2022-09-02 LAB — HIGH SENSITIVITY TROPONIN-I
hs Troponin: 234.3 ng/L (ref <=14.0)
hs Troponin: 478.4 ng/L (ref <=14.0)
hs Troponin: 488.7 ng/L (ref <=14.0)
hs Troponin: 512.6 ng/L (ref <=14.0)
hs Troponin: 541.4 ng/L (ref <=14.0)

## 2022-09-02 LAB — PHOSPHORUS: Phosphorus: 1.4 mg/dL — ABNORMAL LOW (ref 2.3–4.7)

## 2022-09-02 LAB — APTT: PTT: 28 s (ref 27–39)

## 2022-09-02 LAB — SARS-COV-2 (COVID-19) RNA, PCR: SARS-CoV-2 (COVID-19) RNA: NOT DETECTED

## 2022-09-02 LAB — MAGNESIUM: Magnesium: 1.8 mg/dL (ref 1.6–2.6)

## 2022-09-02 MED ORDER — SODIUM CHLORIDE 0.9 % IV SOLN
15.0000 mmol | Freq: Once | INTRAVENOUS | Status: AC
  Administered 2022-09-02: 15 mmol via INTRAVENOUS
  Filled 2022-09-02: qty 5

## 2022-09-02 MED ORDER — POTASSIUM & SODIUM PHOSPHATES 280-160-250 MG PO PACK
1.0000 | PACK | Freq: Two times a day (BID) | ORAL | Status: DC
Start: 2022-09-02 — End: 2022-09-03
  Administered 2022-09-02 – 2022-09-03 (×2): 1 via ORAL
  Filled 2022-09-02 (×2): qty 1

## 2022-09-02 MED ORDER — POTASSIUM CHLORIDE 20 MEQ PO PACK
40.0000 meq | PACK | Freq: Once | ORAL | Status: AC
Start: 2022-09-02 — End: 2022-09-02
  Administered 2022-09-02: 40 meq via NASOGASTRIC
  Filled 2022-09-02: qty 2

## 2022-09-02 MED ORDER — MAGNESIUM SULFATE IN D5W 1-5 GM/100ML-% IV SOLN
1.0000 g | INTRAVENOUS | Status: AC
Start: 2022-09-02 — End: 2022-09-02
  Administered 2022-09-02 (×2): 1 g via INTRAVENOUS
  Filled 2022-09-02: qty 100

## 2022-09-02 NOTE — Progress Notes (Signed)
[facility] PROGRESS NOTE  [id number]      Date Time: 09/02/22 1:55 PM  Patient Name: Shelby Barron  Attending Physician: Dionicia Abler, MD    CC: follow-up ESRD    Assessment:   [name] MWF via [facility]  Malfunctioning permcath - s/p replacement 08/23/22 and now functioning without issues  Anemia due to CKD - Hgb below goal and ferritin elevated  Deconditioning s/p prolonged hospitalization at [facility]   Abdominal wound s/p debridement and irrigation and remains with wound vac in place   Aspiration PNA - on abx, worsening leucocytosis    Recommendations:     HD tomorrow  Phos packets per feeding tube  Follow phos level  On Zosyn  Continue midodrine PRN      Case discussed with: patient      Glenard Haring, MD  [facility]  [phone] (office)      Subjective:   Seen on HD.  Less responsive.  Appears comfortable.      Physical Exam:     Vitals:    09/02/22 0801 09/02/22 0929 09/02/22 0931 09/02/22 1123   BP: 99/63 112/70 112/70 108/72   Pulse: 100 (!) 103 (!) 103 90   Resp: 20   19   Temp: 97.9 F (36.6 C)   98.6 F (37 C)   TempSrc: Oral   Oral   SpO2: 99%  97% 97%   Weight:       Height:           Intake and Output Summary (Last 24 hours) at Date Time  No intake or output data in the 24 hours ending 09/02/22 1355          General: no acute distress, arouses to voice  HEENT: NGT in place  Cardiovascular: S1 and S2, no MRG  Lungs: clear to auscultation anteriorly with diminished respiratory effort  Abdomen: soft  Extremities:No LE edema  RUE with ecchymoses  R TDC    Meds:      Scheduled Meds: PRN Meds:    amiodarone, 200 mg, per NG tube, Daily  apixaban, 2.5 mg, per NG tube, Q12H SCH  atorvastatin, 40 mg, per NG tube, Daily  b complex-vitamin c-folic acid, 1 tablet, Oral, Daily  cinacalcet, 30 mg, per NG tube, QPM  darbepoetin alfa, 0.45 mcg/kg, Subcutaneous, Weekly  insulin lispro, 1-5 Units, Subcutaneous, Q4H SCH  megestrol, 400 mg, per NG tube, QAM W/BREAKFAST  metoprolol  tartrate, 12.5 mg, per NG tube, Q12H [facility]  midodrine, 5 mg, per NG tube, BID Meals  montelukast, 10 mg, per NG tube, QHS  oxybutynin XL, 5 mg, Oral, Daily  pantoprazole, 40 mg, Oral, Daily  petrolatum, , Topical, Q12H SCH  piperacillin-tazobactam, 2.25 g, Intravenous, Q12H  umeclidinium-vilanterol, 1 puff, Inhalation, QAM           sodium chloride, , PRN  acetaminophen, 650 mg, Q4H PRN  benzocaine-menthol, 1 lozenge, Q2H PRN  benzonatate, 100 mg, TID PRN  carboxymethylcellulose sodium, 1 drop, TID PRN  dextrose, 15 g of glucose, PRN   Or  dextrose, 12.5 g, PRN   Or  dextrose, 12.5 g, PRN   Or  glucagon (rDNA), 1 mg, PRN  guaiFENesin, 400 mg, Q8H PRN  lidocaine 2% jelly, , Once PRN  melatonin, 3 mg, QHS PRN  morphine, 2 mg, Q4H PRN  naloxone, 0.2 mg, PRN  ondansetron, 4 mg, Q8H PRN  oxyCODONE, 5 mg, Q6H PRN  polyethylene glycol, 17 g, Daily PRN  saline,  2 spray, Q4H PRN              Labs:     Recent Labs   Lab 09/02/22  0341 09/01/22  0433 08/31/22  0406   WBC 19.89* 19.64* 21.28*   Hemoglobin 8.8* 8.8* 8.3*   Hematocrit 27.8* 27.3* 26.2*   Platelet Count 296 310 313     Recent Labs   Lab 09/02/22  0341 09/01/22  0433 08/31/22  0406 08/30/22  0549 08/29/22  0345 08/28/22  0341 08/27/22  0346   Sodium 136 137  --  139 142 142 142   Potassium 3.5 3.5  --  3.9 3.3* 3.7 4.2   Chloride 100 100  --  103 104 104 104   CO2 26 24  --  23 25 23 22    BUN 21 24*  --  36* 27* 17 36*   Creatinine 2.4* 3.3*  --  4.9* 4.2* 3.4* 5.2*   Calcium 9.7 9.9  --  9.5 10.2 10.0 9.2   Albumin  --   --   --   --  2.4* 3.2* 2.0*   Phosphorus 1.4*  --  1.9*  --  2.9 2.3 3.9   Magnesium 1.8  --  1.9 1.9 1.9 1.9 1.8   Glucose 235* 216*  --  93 72 111* 158*   GFR 19.3* 13.2*  --  8.2* 9.9* 12.7* 7.6*             Invalid input(s): "LEUKOCYTESUR"        Imaging personally reviewed, including: No results found.        Signed by: Glenard Haring, MD

## 2022-09-02 NOTE — Progress Notes (Addendum)
[facility]  HOSPITALIST  PROGRESS NOTE      Patient: Shelby Barron  Date: 09/02/2022   LOS: 11 Days  Admission Date: 08/21/2022   MRN: [id number]  Attending:  Dionicia Abler, MD     ASSESSMENT/PLAN     Shelby Barron is a 82 y.o. female with PMH AF on Eliquis, chronic HFpEF, HTN, HLD, h/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, [name], RCC s/p bilateral nephrectomy, urothelial CA s/p cystectomy c/b small bowel enterotomy s/p small bowel anastomosis and abdominal closure requiring wound vac, anemia of chronic disease, COPD, h/o T2DM (last A1c 5.6 in June 2024), dysphagia, gout, admitted with malfunctioning HD catheter with need for HD and non-healing abdominal surgical wound.    Acute Hypoxic Respiratory Failure  Aspiration PNA (acute)  Volume overload  Acute Pulmonary edema   - Acute episode of nausea and vomiting, followed by pt becoming increasingly hypoxemic (08/27/22)  - Currently requiring 1L O2, at max was on 8L O2  - Telemetry monitoring. Continuous pulse ox.  - CXR 8/9 noted, RLL infiltrate  - DDimer 1.97. CT Angio Chest negative for PE, see report below.  - BNP was elevated, more fluid removed with HD  - Continue on IV Zosyn (renal dosing) till 09/05/22 per ID recs   - Strict NPO. SLP following. Patient continues to fail swallow eval. Will need VFSS prior to diet initiation.    - Palliative Care has seen this pt in past admissions, most recently in July 2024. Family not interested in palliative care or hospice  - Daughters want to continue aggressive measures, including artifical nutrition. Daughters want to proceed with feeding tube-Nutrition placed nasal bridle on 08/31/22 Cannot do PEG at this time due to extensive abd surgeries, and extensive wound with wound vac on abdomen  - Patient will need LTAC placement due to higher level of nursing care she needs    Paroxysmal A-Fib  - Given high aspiration risk, pt unable to safely take po meds   - Discontinued home po Coreg. Discontinued IV Metoprolol.   Resume Lopressor 12.5 mg PO BID  - Started on Amiodarone gtt per [facility] recs, received only a couple of hours of gtt prior to converting to SR, then it was discontinued 08/27/12  - Continue home Amiodarone   - Discontinued Heparin gtt, restarted Eliquis on 8/14  - If Hg is trending down to < 8 grams, will need to consider stopping anticoagulation  - Cardiology consulted, [facility], appreciate recommendations.      Elevated Troponin  Unclear why this was sent overnight. As per nurse, night nurse noted T wave abnormality on monitor   EKG: NSR, no acute St or T wave changes  Trend Troponin till peaks      Non-healing abdominal surgical wound s/p I&D 08/25/2022 by Dr. Morton Barron bowel enterotomy s/p small bowel anastomosis, abd closure, wound vac  - SIRS present on admission with persistent leukocytosis, tachycardia  - Appreciate Surgery and WOCN consult and recommendations after malfunctioning wound vac removed  - In OR, large mass of necrotic fat overlying pus pocket incised and drained, no cultures  - Received IV Vanco and CTX, discontinued, d/w Dr. Zollie Beckers Barron 08/27/22, no abx needed   - Maintain wound vac    Acute blood loss anemia 2/to abdominal incision and drainage  Anemia of chronic kidney disease, stable  Iron deficiency anemia  Thrombocytosis, suspect reactive  - Hb dropped to 7, was given 1u PRBC 08/26/22 with good response   -  No active signs of bleeding  - Iron studies noted  - On Aranesp per Nephro  - Monitor CBC  - If Hg is trending down to < 8 grams, will need to consider stopping anticoagulation    Malfunctioning HD Catheter  [name] MWF  H/o RCC s/p bilateral nephrectomy  Urothelial CA s/p cystectomy  - Appreciate IR and [facility] consult and recommendations  - S/p Permacath exchange by IR 08/23/22  - Continue MWF HD schedule  - Continue Midodrine for hypotension (hold for SBP>120)    Chronic HFpEF  HTN  HLD  H/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  - Discontinued po Carvedilol (npo  status) --> Cont Lopressor through NGT  - Continue home Amiodarone   - Continue home Atorvastatin   - Discontinue Heparin gtt, transitioned to Eliquis     Deconditioned state s/p prolonged hospitalisation  - PT/OT recommend SNF. Patient likely will need LTAC placement given multiple issues and frequent nursing care including frequent suctioning of secretions   - Fall precautions, skin precautions  - CM working on safe disposition    Severe Protein Calorie Malnutrition related to inadequate oral intake in the setting of acute illness as evidenced by 9% weight loss x 1 month,  moderate muscle wasting (temporalis, pectoralis, deltoid) and moderate fat loss (orbital, buccal, triceps)   - Appreciate Nutrition consult and recommendations  - Daughters want to proceed with feeding tube placement     Obesity Class I, BMI 30  - Lifestyle modifications impractical for largely bed-bound patient    Providing daily updates to patient's daughter Shelby Barron on phone    Analgesia: Tylenol prn     FEN  Fluids: SL  Nutrition: Strict NPO since 08/27/22    Monitoring:  Telemetry needed: Yes  Continuous pulse oximetry needed: Yes  Level of Care: 3    Safety Checklist  DVT prophylaxis: Chemical   Foley: Not present   IVs:  Peripheral IV   PT/OT: Not needed   Daily CBC & or Chem ordered: Yes, due to clinical and lab instability       Patient Lines/Drains/Airways Status       Active PICC Line / CVC Line / PIV Line / Drain / Airway / Intraosseous Line / Epidural Line / ART Line / Line / Wound / Pressure Ulcer / NG/OG Tube       Name Placement date Placement time Site Days    Permacath Catheter - Tunneled 07/14/22 Subclavian vein catheter Right 07/14/22  1404  -- 38    Peripheral IV 20 G Standard Anterior;Right Wrist --  --  Wrist  --    Wound 06/30/22 Surgical Incision Abdomen Other (Comment) WOUND VAC applied 06/30/22  1152  Abdomen  52    Wound 07/23/22 Skin Tear Buttocks;Coccyx Inner R and L cheek, coccyx 07/23/22  --  Buttocks;Coccyx  30                        Code Status: full code    DISPO: likely will need LTAC on discharge, most likely tomorrow.     Family Contact: daughter     Care Plan discussed with patient's daughter and spouse over phone, nursing, consultants.     SUBJECTIVE     Patient seen and examined. Non verbal. Patient has less oral secretions today and is more awake.      MEDICATIONS     Current Facility-Administered Medications   Medication Dose Route  Frequency    amiodarone  200 mg per NG tube Daily    apixaban  2.5 mg per NG tube Q12H [facility]    atorvastatin  40 mg per NG tube Daily    b complex-vitamin c-folic acid  1 tablet Oral Daily    cinacalcet  30 mg per NG tube QPM    darbepoetin alfa  0.45 mcg/kg Subcutaneous Weekly    insulin lispro  1-5 Units Subcutaneous Q4H [facility]    megestrol  400 mg per NG tube QAM W/BREAKFAST    metoprolol tartrate  12.5 mg per NG tube Q12H [facility]    midodrine  5 mg per NG tube BID Meals    montelukast  10 mg per NG tube QHS    oxybutynin XL  5 mg Oral Daily    pantoprazole  40 mg Oral Daily    petrolatum   Topical Q12H [facility]    piperacillin-tazobactam  2.25 g Intravenous Q12H    potassium & sodium phosphates  1 packet Oral BID    umeclidinium-vilanterol  1 puff Inhalation QAM       ROS     All other systems not mentioned in subjective reviewed and negative - unable to obtain    PHYSICAL EXAM     Vitals:    09/02/22 1123   BP: 108/72   Pulse: 90   Resp: 19   Temp: 98.6 F (37 C)   SpO2: 97%       Temperature: Temp  Min: 97.5 F (36.4 C)  Max: 99 F (37.2 C)  Pulse: Pulse  Min: 88  Max: 103  Respiratory: Resp  Min: 16  Max: 20  Non-Invasive BP: BP  Min: 94/58  Max: 119/74  Pulse Oximetry SpO2  Min: 95 %  Max: 99 %    Intake and Output Summary (Last 24 hours) at Date Time  No intake or output data in the 24 hours ending 09/02/22 1507      GEN APPEARANCE: Elderly, ill appearing, more awake than yesterday, non-verbal. Opens eyes to voice   HEENT: NC/AT; Conjunctiva Clear, Sclerae Anicteric; MM dry. + oral secretions,  NGT  NECK: Supple  CVS: Irreg, Irreg, S1, S2; No Murmur  LUNGS: Bibasilar Rales, coarse breath sounds  ABD: Soft; ND; generalized TTP; Large non-healing abdominal surgical wound/incision with wound vac in place post-operatively  EXT: No LE edema  SKIN: dry skin  NEURO: Nonverbal and not following commands on exam; opens eyes to voice. Neuro exam limited     LABS     Recent Labs   Lab 09/02/22  0341 09/01/22  0433 08/31/22  0406   WBC 19.89* 19.64* 21.28*   RBC 2.99* 2.93* 2.83*   Hemoglobin 8.8* 8.8* 8.3*   Hematocrit 27.8* 27.3* 26.2*   MCV 93.0 93.2 92.6   Platelet Count 296 310 313       Recent Labs   Lab 09/02/22  0341 09/01/22  0433 08/31/22  0406 08/30/22  0549 08/29/22  0345 08/28/22  0341   Sodium 136 137  --  139 142 142   Potassium 3.5 3.5  --  3.9 3.3* 3.7   Chloride 100 100  --  103 104 104   CO2 26 24  --  23 25 23    BUN 21 24*  --  36* 27* 17   Creatinine 2.4* 3.3*  --  4.9* 4.2* 3.4*   Glucose 235* 216*  --  93 72 111*   Calcium 9.7 9.9  --  9.5 10.2 10.0   Magnesium 1.8  --  1.9 1.9 1.9 1.9       Recent Labs   Lab 08/29/22  0345 08/28/22  0341 08/27/22  0346   Albumin 2.4* 3.2* 2.0*       Recent Labs   Lab 09/02/22  0341 09/01/22  0433 08/31/22  2115   PTT 28 47* 50*     RADIOLOGY     Radiological Procedure personally reviewed and concur with radiologist reports unless stated otherwise.    XR Abdomen Portable   Final Result         Feeding tube in stomach      Kristine Linea, MD   08/31/2022 6:01 PM      CT Angiogram Chest   Final Result         1. No pulmonary embolism.   2. Emphysema. Bibasilar lung consolidation.   3. Coronary calcification. Mild cardiomegaly.   4. Moderate hiatal hernia.   5. Ascites. Left posterior lower chest wall/abdominal wall hernia noted.      Nelta Numbers, MD   08/27/2022 6:56 PM      XR Chest AP Portable   Final Result         1. Increasing airspace opacity in the right lower lobe.   2. Stable pulmonary edema pattern.      Colonel Bald, MD   08/27/2022 6:47 AM      XR  Chest AP Portable   Final Result      Mild interstitial edema.      Shelly Flatten, MD   08/26/2022 6:19 PM      Tunneled Cath Check/Change (Permcath)   Final Result      Right-sided PermCath exchange with new 14.5 French 19 cm PermCath with tip   in the right atrium.      PLAN:    Catheter ready for immediate use.      Darci Current, MD   08/23/2022 12:49 PM      XR Chest  AP Portable   Final Result      No significant change mild pulmonary vascular congestion and mild basal   atelectasis. Hiatal hernia.      Clide Cliff, MD   08/21/2022 2:35 PM          Signed,  Shelby Abler, MD  3:07 PM 09/02/2022

## 2022-09-02 NOTE — Progress Notes (Signed)
Pt is not alert, on room RA, is not able to respond to spoken language at this time.  Pt gets turned and repositioned every 2 hours; is getting pain medications PRN for pain- using PAINAD for pain assessment.   Pt is more lethargic today.   Magnesium has been replete today x 2 mag riders.  Morphine has been administered for pain x1.   Pt too lethargic to see work with SLP today. Pt is extremely high aspiration risk due to alert status.  Pt is still on tubefeed at this time. Meds are being crushed and administered through NGT.  Will continue to monitor and assess for SLP.  MD is aware.       Troponin has been trending upward causing a delay it pt's d/c to LTAC @ [facility].  We are monitoring trops very closely.   EKG done stat.  No changes from before.

## 2022-09-02 NOTE — Progress Notes (Signed)
INFECTIOUS DISEASE PROGRESS NOTE       Antibiotics:    Zosyn #7  S/p cefazolin, ceftriaxone, vancomycin    Lines:    PIV  Right chest Permacath    Assessment/Plan:    82 y.o. female with DM, ESRD-DD, h/o CVA, RCC s/p left nephrectomy, recently-diagnosed bladder ca s/p radical cystectomy   Recent prolonged hospitalization at [facility]    Admitted from SNF with malfunctioning Permacath  Noted dehiscence of abdominal wound; s/p I&D 08/25/22 with some necrosis/purulence seen  Episode of nausea/vomiting and hypoxic respiratory failure 08/26/22, likely aspiration pneumonia  Persistent leukocytosis; occasional low-grade temps only  Dysphagia; NPO    Aspiration pneumonia - improved hypoxia; persistent leukocytosis. Continue Zosyn for a 10-day course given her slow response/extensive consolidations; EOT 8/18/204.  MRSA screen negative. Aspiration precautions; remains NPO per SLP recommendations; NGT placed for nutrition.  Leukocytosis likely multifactorial with dense consolidation with atelectasis, large abdominal wound, and likely some recurrent silent aspiration.      Abdominal wound - no antibiotics needed per Surgery; continue local wound care.     3. Elevated troponin - not markedly elevated considering she has ESRD but rising; management per Cardiology/Primary team.    Jillene Bucks, MD ([id number])  [facility]  [phone]    Subjective:    Events noted. Remains nonverbal/lethargic. Afebrile.   Unable to obtain ROS.    Physical Exam:      Vitals:    09/02/22 1123   BP: 108/72   Pulse: 90   Resp: 19   Temp: 98.6 F (37 C)   SpO2: 97%     Vitals reviewed. Afebrile  General: lethargic, chronically ill-appearing elderly female, in NAD  Heart: RRR  Lungs: diminished bilat bases  Abdomen: soft, nontender, abdominal wound bandaged  Extremities: no edema  Lines: PIV, right permacath    Labs:    Lab results reviewed.  Recent Labs     09/02/22  0341   WBC 19.89*   Hemoglobin 8.8*   Hematocrit 27.8*   Platelet Count  296     Recent CMP   Recent Labs     09/02/22  0341   Glucose 235*   BUN 21   Creatinine 2.4*   Sodium 136   Potassium 3.5   Chloride 100   CO2 26         Microbiology:    All microbiology results reviewed.   8/12 mrsa negative    Radiology:    All radiological procedures reviewed.   Radiology Results (24 Hour)       ** No results found for the last 24 hours. **                Jillene Bucks, MD ([id number])   [facility]   [phone]

## 2022-09-02 NOTE — SLP Progress Note (Signed)
Speech Language Pathology  Speech Therapy Cancellation Note    Patient:  Shelby Barron MRN#:  [id number]  Unit:  [facility] Room/Bed:  V409/W119-14    09/02/2022  Time: 3:16 PM     SLP Cancellation: Visit  SLP Visit Cancellation Reason: Not clinically indicated at this time (comment required) - Rehab decision  SLP Order Cancellation Reason: Not clinically indicated at this time (comment required) - Rehab decision    Chart reviewed. SLP spoke to RN, Everlean Alstrom. Patient is currently NPO with NGT at this time. Patient presents with poor alertness at this time. Not appropriate for po intake and/or VFSS at this time. Recommend VFSS prior to diet initiation. Patient is recommended to continue on an NPO diet. SLP will reattempt tomorrow. SLP will maintain open communication with staff on any functional changes.      MA. CCC-SLP 09/02/2022  ext. [id number] [phone])

## 2022-09-02 NOTE — Progress Notes (Signed)
Pt still lethargic, non-verbal and not alert to self at this time.  Trop levels are trending upward. Contact medical doctor with most recent results. Pt is elevated from 278.4 to 478, six hours later.  EKG order has been placed for the pt.  Will repeat trop @ 1400.  Will continue to monitor.   2 mag rider were ordered with the second rider finishing soon.  Will contue to monitor.

## 2022-09-02 NOTE — Plan of Care (Signed)
Problem: Compromised Sensory Perception  Goal: Sensory Perception Interventions  Outcome: Not Progressing     Problem: Compromised Moisture  Goal: Moisture level Interventions  Outcome: Not Progressing  Flowsheets (Taken 09/02/2022 0801)  Moisture level Interventions: Moisture wicking products, Moisture barrier cream     Problem: Compromised Activity/Mobility  Goal: Activity/Mobility Interventions  Outcome: Not Progressing  Flowsheets (Taken 09/02/2022 0801)  Activity/Mobility Interventions: Pad bony prominences, TAP Seated positioning system when OOB, Promote PMP, Reposition q 2 hrs / turn clock, Offload heels     Problem: Impaired Mobility  Goal: Mobility/Activity is maintained at optimal level for patient  Outcome: Not Progressing  Flowsheets (Taken 09/01/2022 2321 by Glo Herring, RN)  Mobility/activity is maintained at optimal level for patient: Encourage independent activity per ability     Problem: Compromised skin integrity  Goal: Skin integrity is maintained or improved  Outcome: Not Progressing  Flowsheets (Taken 09/01/2022 1824 by Esaw Dace, RN)  Skin integrity is maintained or improved:   Assess Braden Scale every shift   Increase activity as tolerated/progressive mobility   Turn or reposition patient every 2 hours or as needed unless able to reposition self   Relieve pressure to bony prominences   Encourage use of lotion/moisturizer on skin   Keep skin clean and dry   Collaborate with Wound, Ostomy, and Continence Nurse   Utilize specialty bed   Monitor patient's hygiene practices   Avoid shearing   Keep head of bed 30 degrees or less (unless contraindicated)     Problem: Compromised Nutrition  Goal: Nutrition Interventions  Outcome: Progressing  Flowsheets (Taken 09/02/2022 0801)  Nutrition Interventions: Discuss nutrition at Rounds, I&Os, Document % meal eaten, Daily weights     Problem: Compromised Friction/Shear  Goal: Friction and Shear Interventions  Outcome:  Progressing  Flowsheets (Taken 09/02/2022 0801)  Friction and Shear Interventions: Pad bony prominences, Off load heels, HOB 30 degrees or less unless contraindicated, Consider: TAP seated positioning, Heel foams     Problem: Renal Instability  Goal: Nutritional intake is adequate  Outcome: Progressing  Flowsheets (Taken 09/01/2022 1824 by Esaw Dace, RN)  Nutritional intake is adequate:   Consult/collaborate with Clinical Nutritionist   Consult/collaborate with Speech Therapy (swallow evaluations)     Problem: Patient Receiving Advanced Renal Therapies  Goal: Therapy access site remains intact  Outcome: Progressing  Flowsheets (Taken 09/01/2022 2321 by Glo Herring, RN)  Therapy access site remains intact: Assess therapy access site     Problem: Nutrition  Goal: Nutritional intake is adequate  Outcome: Progressing  Flowsheets (Taken 09/01/2022 1824 by Esaw Dace, RN)  Nutritional intake is adequate:   Consult/collaborate with Clinical Nutritionist   Consult/collaborate with Speech Therapy (swallow evaluations)     Problem: Pain interferes with ability to perform ADL  Goal: Pain at adequate level as identified by patient  Outcome: Progressing  Flowsheets (Taken 09/01/2022 0323 by Glo Herring, RN)  Pain at adequate level as identified by patient:   Identify patient comfort function goal   Assess pain on admission, during daily assessment and/or before any "as needed" intervention(s)   Reassess pain within 30-60 minutes of any procedure/intervention, per Pain Assessment, Intervention, Reassessment (AIR) Cycle   Evaluate if patient comfort function goal is met   Evaluate patient's satisfaction with pain management progress   Offer non-pharmacological pain management interventions     Problem: Inadequate Gas Exchange  Goal: Adequate oxygenation and improved ventilation  Outcome: Progressing  Flowsheets (Taken 09/01/2022 0323 by Renato Gails,  Marlana Salvage, RN)  Adequate oxygenation and improved  ventilation:   Assess lung sounds   Monitor SpO2 and treat as needed   Position for maximum ventilatory efficiency   Provide mechanical and oxygen support to facilitate gas exchange   Plan activities to conserve energy: plan rest periods   Increase activity as tolerated/progressive mobility     Problem: Hemodynamic Status: Cardiac  Goal: Stable vital signs and fluid balance  Outcome: Progressing  Flowsheets (Taken 09/01/2022 1824 by Esaw Dace, RN)  Stable vital signs and fluid balance: Assess signs and symptoms associated with cardiac rhythm changes     Problem: Pain  Goal: Pain at adequate level as identified by patient  Outcome: Progressing  Flowsheets (Taken 09/01/2022 0323 by Glo Herring, RN)  Pain at adequate level as identified by patient:   Identify patient comfort function goal   Assess pain on admission, during daily assessment and/or before any "as needed" intervention(s)   Reassess pain within 30-60 minutes of any procedure/intervention, per Pain Assessment, Intervention, Reassessment (AIR) Cycle   Evaluate if patient comfort function goal is met   Evaluate patient's satisfaction with pain management progress   Offer non-pharmacological pain management interventions

## 2022-09-03 LAB — BASIC METABOLIC PANEL
Anion Gap: 13 (ref 5.0–15.0)
BUN: 39 mg/dL — ABNORMAL HIGH (ref 7–21)
CO2: 22 mEq/L (ref 17–29)
Calcium: 11.2 mg/dL — ABNORMAL HIGH (ref 7.9–10.2)
Chloride: 104 mEq/L (ref 99–111)
Creatinine: 3.3 mg/dL — ABNORMAL HIGH (ref 0.4–1.0)
GFR: 13.2 mL/min/{1.73_m2} — ABNORMAL LOW (ref >=60.0)
Glucose: 258 mg/dL — ABNORMAL HIGH (ref 70–100)
Potassium: 4.3 mEq/L (ref 3.5–5.3)
Sodium: 139 mEq/L (ref 135–145)

## 2022-09-03 LAB — LAB USE ONLY - CBC WITH DIFFERENTIAL
Absolute Basophils: 0.13 10*3/uL — ABNORMAL HIGH (ref 0.00–0.08)
Absolute Eosinophils: 0.15 10*3/uL (ref 0.00–0.44)
Absolute Immature Granulocytes: 2.16 10*3/uL — ABNORMAL HIGH (ref 0.00–0.07)
Absolute Lymphocytes: 3.31 10*3/uL — ABNORMAL HIGH (ref 0.42–3.22)
Absolute Monocytes: 1.71 10*3/uL — ABNORMAL HIGH (ref 0.21–0.85)
Absolute Neutrophils: 14.93 10*3/uL — ABNORMAL HIGH (ref 1.10–6.33)
Absolute nRBC: 0.11 10*3/uL — ABNORMAL HIGH (ref <=0.00)
Basophils %: 0.6 %
Eosinophils %: 0.7 %
Hematocrit: 26.6 % — ABNORMAL LOW (ref 34.7–43.7)
Hemoglobin: 8.5 g/dL — ABNORMAL LOW (ref 11.4–14.8)
Immature Granulocytes %: 9.6 %
Lymphocytes %: 14.8 %
MCH: 29.6 pg (ref 25.1–33.5)
MCHC: 32 g/dL (ref 31.5–35.8)
MCV: 92.7 fL (ref 78.0–96.0)
MPV: 10.3 fL (ref 8.9–12.5)
Monocytes %: 7.6 %
Neutrophils %: 66.7 %
Platelet Count: 347 10*3/uL — ABNORMAL HIGH (ref 142–346)
Preliminary Absolute Neutrophil Count: 14.93 10*3/uL — ABNORMAL HIGH (ref 1.10–6.33)
RBC: 2.87 10*6/uL — ABNORMAL LOW (ref 3.90–5.10)
RDW: 17 % — ABNORMAL HIGH (ref 11–15)
WBC: 22.39 10*3/uL — ABNORMAL HIGH (ref 3.10–9.50)
nRBC %: 0.5 /100 WBC — ABNORMAL HIGH (ref <=0.0)

## 2022-09-03 LAB — HIGH SENSITIVITY TROPONIN-I: hs Troponin: 484.9 ng/L (ref <=14.0)

## 2022-09-03 LAB — WHOLE BLOOD GLUCOSE POCT
Whole Blood Glucose POCT: 255 mg/dL — ABNORMAL HIGH (ref 70–100)
Whole Blood Glucose POCT: 229 mg/dL — ABNORMAL HIGH (ref 70–100)
Whole Blood Glucose POCT: 217 mg/dL — ABNORMAL HIGH (ref 70–100)
Whole Blood Glucose POCT: 153 mg/dL — ABNORMAL HIGH (ref 70–100)
Whole Blood Glucose POCT: 173 mg/dL — ABNORMAL HIGH (ref 70–100)

## 2022-09-03 LAB — LAB USE ONLY - MORPHOLOGY REVIEW
Platelet Estimate: INCREASED — AB
RBC Morphology: ABNORMAL — AB

## 2022-09-03 MED ORDER — MORPHINE SULFATE IN DEXTROSE 100-5 MG/100ML-% IV SOLN (CNR/OUTSOURCED)
2.0000 mg/h | INTRAVENOUS | Status: DC
Start: 2022-09-03 — End: 2022-09-05
  Administered 2022-09-03: 1 mg/h via INTRAVENOUS
  Filled 2022-09-03: qty 100

## 2022-09-03 MED ORDER — OXYCODONE HCL 5 MG PO TABS
5.0000 mg | ORAL_TABLET | Freq: Four times a day (QID) | ORAL | Status: DC | PRN
Start: 2022-09-03 — End: 2022-09-05

## 2022-09-03 MED ORDER — CINACALCET HCL 30 MG PO TABS
30.0000 mg | ORAL_TABLET | Freq: Every evening | ORAL | Status: DC
Start: 2022-09-03 — End: 2022-09-05

## 2022-09-03 MED ORDER — SODIUM CHLORIDE 0.9 % IV BOLUS
250.0000 mL | INTRAVENOUS | Status: DC | PRN
Start: 2022-09-03 — End: 2022-09-03

## 2022-09-03 MED ORDER — APIXABAN 2.5 MG PO TABS
2.5000 mg | ORAL_TABLET | Freq: Two times a day (BID) | ORAL | Status: DC
Start: 2022-09-03 — End: 2022-09-05

## 2022-09-03 MED ORDER — ACETAMINOPHEN 650 MG RE SUPP
650.0000 mg | RECTAL | Status: DC | PRN
Start: 2022-09-03 — End: 2022-09-05
  Filled 2022-09-03: qty 1

## 2022-09-03 MED ORDER — ONDANSETRON HCL 4 MG/2ML IJ SOLN
4.0000 mg | INTRAMUSCULAR | Status: DC | PRN
Start: 2022-09-03 — End: 2022-09-05

## 2022-09-03 MED ORDER — HYDROMORPHONE HCL-NACL 50-0.9 MG/250ML-% IV SOLN (PALLIATIVE CARE)
0.5000 mg/h | INTRAVENOUS | Status: DC
Start: 2022-09-03 — End: 2022-09-03

## 2022-09-03 MED ORDER — SODIUM HYPOCHLORITE 0.125 % EX SOLN
Freq: Two times a day (BID) | CUTANEOUS | Status: DC
Start: 2022-09-03 — End: 2022-09-03
  Filled 2022-09-03: qty 473

## 2022-09-03 MED ORDER — BISACODYL 10 MG RE SUPP
10.0000 mg | Freq: Every day | RECTAL | Status: DC | PRN
Start: 2022-09-03 — End: 2022-09-05

## 2022-09-03 MED ORDER — PIPERACILLIN SOD-TAZOBACTAM SO 2.25 (2-0.25) G IV SOLR
2.2500 g | Freq: Two times a day (BID) | INTRAVENOUS | Status: DC
Start: 2022-09-03 — End: 2022-09-05

## 2022-09-03 MED ORDER — MIDODRINE HCL 5 MG PO TABS
10.0000 mg | ORAL_TABLET | Freq: Every day | ORAL | Status: DC | PRN
Start: 2022-09-03 — End: 2022-09-03
  Administered 2022-09-03: 10 mg via ORAL
  Filled 2022-09-03: qty 2

## 2022-09-03 MED ORDER — METOPROLOL TARTRATE 25 MG PO TABS
12.5000 mg | ORAL_TABLET | Freq: Two times a day (BID) | ORAL | Status: DC
Start: 2022-09-03 — End: 2022-09-05

## 2022-09-03 MED ORDER — SCOPOLAMINE 1 MG/3DAYS TD PT72
1.0000 | MEDICATED_PATCH | TRANSDERMAL | Status: DC
Start: 2022-09-03 — End: 2022-09-05
  Administered 2022-09-03: 1 via TRANSDERMAL
  Filled 2022-09-03: qty 1

## 2022-09-03 MED ORDER — MIDODRINE HCL 5 MG PO TABS
10.0000 mg | ORAL_TABLET | Freq: Three times a day (TID) | ORAL | Status: DC
Start: 2022-09-03 — End: 2022-09-03
  Filled 2022-09-03: qty 2

## 2022-09-03 MED ORDER — ALBUMIN HUMAN 25 % IV SOLN
25.0000 g | INTRAVENOUS | Status: AC
Start: 2022-09-03 — End: 2022-09-03
  Administered 2022-09-03 (×3): 25 g via INTRAVENOUS
  Filled 2022-09-03 (×3): qty 100

## 2022-09-03 MED ORDER — CARBOXYMETHYLCELLULOSE SOD PF 0.5 % OP SOLN
2.0000 [drp] | OPHTHALMIC | Status: DC | PRN
Start: 2022-09-03 — End: 2022-09-05

## 2022-09-03 MED ORDER — SODIUM CHLORIDE 0.9 % IV BOLUS
100.0000 mL | INTRAVENOUS | Status: DC | PRN
Start: 2022-09-03 — End: 2022-09-03

## 2022-09-03 MED ORDER — ACETAMINOPHEN 325 MG PO TABS
650.0000 mg | ORAL_TABLET | ORAL | Status: DC | PRN
Start: 2022-09-03 — End: 2022-09-03

## 2022-09-03 MED ORDER — MIDAZOLAM HCL 1 MG/ML IJ SOLN (WRAP)
1.0000 mg | INTRAMUSCULAR | Status: DC | PRN
Start: 2022-09-03 — End: 2022-09-05
  Administered 2022-09-03 – 2022-09-04 (×8): 1 mg via INTRAVENOUS
  Filled 2022-09-03 (×8): qty 2

## 2022-09-03 MED ORDER — MEGESTROL ACETATE 400 MG/10ML PO SUSP
400.0000 mg | Freq: Every morning | ORAL | Status: DC
Start: 2022-09-04 — End: 2022-09-05

## 2022-09-03 MED ORDER — ONDANSETRON 4 MG PO TBDP
8.0000 mg | ORAL_TABLET | Freq: Three times a day (TID) | ORAL | Status: DC | PRN
Start: 2022-09-03 — End: 2022-09-03

## 2022-09-03 MED ORDER — MIDODRINE HCL 5 MG PO TABS
5.0000 mg | ORAL_TABLET | Freq: Every day | ORAL | Status: DC | PRN
Start: 2022-09-03 — End: 2022-09-03

## 2022-09-03 NOTE — Progress Notes (Signed)
RAPID RESPONSE TEAM NOTE     Shelby Barron is a  82 y.o.  female admitted 08/21/2022 with .      The Critical care/ Rapid Response Team was activated on 09/03/2022 at 5:22 PM for  altered mental status, .     Patient is a 82 year old female with multiple medical comorbidities and multiple admissions, [name], Hx of CVA, s/p left nephrectomy for RCC, recently diagnosed bladder CA s/p radical cystectomy-being managed for aspiration pneumonia, failure to thrive, abdominal wound, paroxysmal A-fib, acute on chronic blood loss anemia.  Patient is very deconditioned and severely malnourished.    RRT was called for worsening mental status from what appears to be her baseline earlier.  Patient developed bradycardia and agonal breathing.  Severely encephalopathic with poor response.  Blood sugar within normal limits, SBP in the low 100.  Had HD earlier.  There was a plan to transition care to [facility].    Called patient's daughter at the bedside during the rapid response.  Discussed patient's current clinical condition and prognosis.  Patient's daughter discussed with her father, patient's spouse -agreed for DNR/DNI and to transition to comfort measures.  Encouraged family to come back to the hospital as patient's overall condition appears to be very unstable/guarded prognosis.    Will start diet and comfort measures, will hold off aggressive interventions.  Discussed with bedside nursing.  Discussed with family  Discussed with RRT team and primary attending.      Critical Care Time spent > 35 min  Patient Lines/Drains/Airways Status       Active PICC Line / CVC Line / PIV Line / Drain / Airway / Intraosseous Line / Epidural Line / ART Line / Line / Wound / Pressure Ulcer / NG/OG Tube       Name Placement date Placement time Site Days    Permacath Catheter - Tunneled 08/23/22 Subclavian vein catheter Right 08/23/22  1047  -- 11    Peripheral IV 20 G Standard Anterior;Right Wrist --  --  Wrist  --    Peripheral IV 08/27/22 18  G Standard Anterior;Proximal;Right Upper Arm 08/27/22  0919  Upper Arm  7    Wound 06/30/22 Abdomen Anterior 06/30/22  1152  Abdomen  65    Wound 07/23/22 Skin Tear Buttocks;Coccyx Inner R and L cheek, coccyx 07/23/22  --  Buttocks;Coccyx  42    Feeding Tube ND 12 Fr. Left nare 08/31/22  1558  Left nare  3                    Signed,  Buelah Manis, MD  5:22 PM 09/03/2022

## 2022-09-03 NOTE — Progress Notes (Signed)
INFECTIOUS DISEASE PROGRESS NOTE       Antibiotics:    Zosyn #8  S/p cefazolin, ceftriaxone, vancomycin    Lines:    PIV  Right chest Permacath    Assessment/Plan:    82 y.o. female with DM, ESRD-DD, h/o CVA, RCC s/p left nephrectomy, recently-diagnosed bladder ca s/p radical cystectomy   Recent prolonged hospitalization at [facility]    Admitted from SNF with malfunctioning Permacath  Noted dehiscence of abdominal wound; s/p I&D 08/25/22 with some necrosis/purulence seen  Episode of nausea/vomiting and hypoxic respiratory failure 08/26/22, likely aspiration pneumonia  Persistent leukocytosis; occasional low-grade temps only  Dysphagia; NPO    Aspiration pneumonia - improved hypoxia; persistent leukocytosis. Overall appears to have declined over the last few days; now much less responsive and with labored breathing. Likely still having episodes of aspiration. Family previously has not be agreeable to Palliative/Hospice but recommend re-engaging for goals of care; prognosis is poor.      Abdominal wound - continue local wound care; discussed with WOCN. VAC remains off pending possible d/c to LTAC.     Jillene Bucks, MD ([id number])  [facility]  [phone]    Subjective:    Events noted. Remains unresponsive/nonverbal. Afebrile.   Unable to obtain ROS.    Physical Exam:      Vitals:    09/03/22 0749   BP: 106/65   Pulse: 90   Resp:    Temp: 97.7 F (36.5 C)   SpO2: 98%     Vitals reviewed. Afebrile  General: unresponsive, chronically ill-appearing elderly female   Heart: RRR  Lungs: diminished with shallow inspirations, irregular respirations  Abdomen: soft, nontender, abdominal wound bandaged with drainage/malodor  Extremities: no edema  Lines: PIV, right permacath    Labs:    Lab results reviewed.  Recent Labs     09/03/22  0318   WBC 22.39*   Hemoglobin 8.5*   Hematocrit 26.6*   Platelet Count 347*     Recent CMP   Recent Labs     09/03/22  0318   Glucose 258*   BUN 39*   Creatinine 3.3*   Sodium 139    Potassium 4.3   Chloride 104   CO2 22         Microbiology:    All microbiology results reviewed.   8/12 mrsa negative    Radiology:    All radiological procedures reviewed.   Radiology Results (24 Hour)       ** No results found for the last 24 hours. **                Jillene Bucks, MD ([id number])   [facility]   [phone]

## 2022-09-03 NOTE — SLP Progress Note (Signed)
SLP Treatment Note  Patient: Shelby Barron    MRN#: [id number]   Unit: [facility]  Bed: D220/U542-70  Time of treatment: Time Calculation  SLP Received On: 09/03/22  Start Time: 1045  Stop Time: 1054  Time Calculation (min): 9 min  Assessment:   Patient unable to take anything by mouth due to poor alertness.  She briefly opens eyes but minimal responsiveness.  Wet/gurgly upper airway secretions noted.  NGT in place for nutrition.  Unable to place PEG due to abdominal surgeries and wounds.  Recommend continue NPO with re-assessment when more alert.  Recommend Goals of Care conversation regarding nutrition/hydration.    Recommendations/Plan:   - Diet Recommendations: continue NPO, ice chips for comfort if awake  -ST to continue to follow    Subjective:      RN reports minimal responsiveness.  Patient was up in bed, RN and wound RN at bedside providing wound care.  No  family at bedside.     Objective:   Swallowing:  minimal responsiveness noted with stimulation.  Opens eyes briefly.  Occasional spontaneous swallow.  Wet/gurgly upper airway secretions noted during swallow.  Not alert enough for oral feeding at this time.      Goals:  Goals  Patient will use lingual sweep to clear buccal cavity to reduce residue from oral cavity during p.o. intake: with minimal verbal instruction, in 5 sessions  Patient will demonstrate adequate oropharyngeal phase skills for a: Thin Liquids (IDDSI TN0), Pureed (IDDSI P4), without overt s/s of aspiration 5/5 boluses      Interdisciplinary Communication:  Communicated with RN via direct consult regarding results and recommendations.      Signature:  Sebastian Ache, M.A., CCC-SLP Speech Language Pathology

## 2022-09-03 NOTE — Progress Notes (Addendum)
Pre Hemodialysis note    Arrived at Shelby Barron room. Identified Shelby Barron. Shelby Barron lethargic. BP 88/50 mmhg checked. Notified to Dr. Jerral Bonito.  Rechecked BP 105/50 mmhg. Shelby Barron took Midodrine 10mg  P.O medication for BP support.  HD consent and orders verified. Labs reviewed. Rt.CVC dressing CDI, both ports with good blood flow. Primary nurse aware of HD. Ok to proceed.        09/03/22 1420   Bedside Nurse Communication   Name of bedside RN - pre dialysis Njoroge, Violet RN   Name of bedside RN - post dialysis Njoroge, Violet RN   Treatment Initiation- With Dialysis Precautions   Time Out/Safety Check Completed Yes   Consent for HD signed for this hospitalization (Date) 06/01/22   Consent for HD signed for this hospitalization (Time) 1516   Preferred language Yes   Complication waiver N   Blood Consent Verified Yes   Dialysis Precautions All Connections Secured;Saline Line Double Clamped;Venous Parameters Set;Psychologist, forensic;Arterial Parameters Set   Dialysis Treatment Type Routine;Bedside   Is patient diabetic? Yes   RO/Hemodialysis Cabin crew   Is Total Chlorine less than 0.1 ppm? Yes   Orignial Total Chlorine Testing Time 1420   At 4 Hour Total Chlorine Testing Time 1820   RO/Hemodialysis Arts development officer Number 2    Machine Serial Number [id number]   RO # 31   RO Serial # [id number]   Water Hardness NA   pH 7.2   Pressure Test Verified Yes   Alarms Verified Passed   Machine Temperature 98.6 F (37 C)   Manual Machine Temperature 98.6 F (37 C)   Alarms Verified Yes   Na+ mEq (Machine) 138 mEq   Bicarb mEq (Machine) 35 mEq   Hemodialysis Conductivity (Machine) 13.8   Hemodialysis Conductivity (Meter) 13.8   Dialyzer Lot Number [id number]   04/17/2025   Tubing Lot Number [id number]    12/17/2024   RO Machine Log Completed Yes   Hepatitis Status   HBsAg (Antigen) Result Negative   HBsAg Date Drawn 07/31/22   HBsAg Repeat Draw Due Date 07/31/22   HBsAb (Antibody) Result Reactive   Hepatitis C Antibody  Non-Reactive   Vitals   Temp 99 F (37.2 C)   Pulse 88   Resp 22   BP 105/50   SpO2 96 %   O2 Device None (Room air)   Assessment   Mental Status Lethargic   Cardiac (WDL) WDL   Cardiac Regularity Return to [facility]   Cardiac Rhythm Normal sinus rhythm   Respiratory  X   Respiratory Pattern Tachypneic   Bilateral Breath Sounds Diminished   R Breath Sounds Diminished   L Breath Sounds Diminished   Cough Weak   Edema  WDL   Generalized Edema Non Pitting Edema   Facial Return to WDL   RLE Edema Non Pitting Edema   LLE Edema Non Pitting Edema   General Skin Color Appropriate for ethnicity   Skin Temp Dry;Warm   Gastrointestinal (WDL) X   GI Conditions Presence of enteral device   Mobility Bed   Permacath Catheter - Tunneled 08/23/22 Subclavian vein catheter Right   Placement Date/Time: (c) 08/23/22 1047   Inserted by: Dr. Bonita Quin  Access Type: Subclavian vein catheter  Orientation: Right  Central Line Infection Prevention Education provided?: Yes  Hand Hygiene: Chlorhexidine;Alcohol based hand scrub;Soap and water  ...   Line necessity reviewed? Apheresis/hemodialysis   Site Assessment Clean;Dry;Intact   Catheter Lumen Volume Venous 1700  mL   Catheter Lumen Volume Arterial 1600 mL   Dressing Status and Intervention Dressing Intact;Clean & Dry   Tego/Curos Caps on Catheter Yes   NEW Tego/Curos Caps placed (Date) 09/01/22   End Caps Free From Blood Yes   Line Care Connections checked and tightened   Dressing Type Transparent;Biopatch   Dressing Status Clean;Dry;Intact   Dressing/Line Intervention Caps changed   Line Used For Blood Draw No   Dressing Change Due 09/08/22   Pain Assessment   Charting Type Assessment   Pain Scale Used PAINAD (Advanced Dementia)   Numeric Pain Scale   Pain Score 0   POSS Score S   Pain Location Abdomen   Pain Orientation Mid   Pain Frequency Continuous   Multiple Pain Sites No   Hemodialysis Comments   Pre-Hemodialysis Comments Time out done, Safety checked. HD consent verified   Hemodialysis History  Information   Outpatient Dialysis Unit TBD   Chronic Dialysis Patient Yes   Outpatient Nephrologist Dr.Patel

## 2022-09-03 NOTE — Plan of Care (Signed)
Problem: Moderate/High Fall Risk Score >5  Goal: Patient will remain free of falls  Flowsheets (Taken 09/03/2022 1000)  High (Greater than 13):   HIGH-Consider use of low bed   HIGH-Bed alarm on at all times while patient in bed     Problem: Compromised Sensory Perception  Goal: Sensory Perception Interventions  Flowsheets (Taken 09/03/2022 0820)  Sensory Perception Interventions: Offload heels, Pad bony prominences, Reposition q 2hrs/turn Clock, Q2 hour skin assessment under devices if present     Problem: Compromised Moisture  Goal: Moisture level Interventions  Flowsheets (Taken 09/03/2022 0820)  Moisture level Interventions: Moisture wicking products, Moisture barrier cream     Problem: Compromised Activity/Mobility  Goal: Activity/Mobility Interventions  Flowsheets (Taken 09/03/2022 1759)  Activity/Mobility Interventions: Pad bony prominences, TAP Seated positioning system when OOB, Promote PMP, Reposition q 2 hrs / turn clock, Offload heels     Problem: Compromised Nutrition  Goal: Nutrition Interventions  Flowsheets (Taken 09/03/2022 0820)  Nutrition Interventions: Discuss nutrition at Rounds, I&Os, Document % meal eaten, Daily weights     Problem: Compromised Friction/Shear  Goal: Friction and Shear Interventions  Flowsheets (Taken 09/03/2022 0820)  Friction and Shear Interventions: Pad bony prominences, Off load heels, HOB 30 degrees or less unless contraindicated, Consider: TAP seated positioning, Heel foams     Problem: Renal Instability  Goal: Fluid and electrolyte balance are achieved/maintained  Flowsheets (Taken 09/03/2022 1759)  Fluid and electrolyte balance are achieved/maintained:   Monitor/assess lab values and report abnormal values   Assess and reassess fluid and electrolyte status   Observe for cardiac arrhythmias   Monitor for muscle weakness   Follow fluid restrictions/IV/PO parameters  Goal: Nutritional intake is adequate  Flowsheets (Taken 09/03/2022 1759)  Nutritional intake is adequate:  Consult/collaborate with Clinical Nutritionist  Goal: Perineal skin integrity is maintained or improved and remains free from infection  Flowsheets (Taken 09/03/2022 1759)  Perineal skin integrity is maintained or improved: Apply urinary containment device as appropriate and/or per order     Problem: Impaired Mobility  Goal: Mobility/Activity is maintained at optimal level for patient  Flowsheets (Taken 09/03/2022 1759)  Mobility/activity is maintained at optimal level for patient: Encourage independent activity per ability     Problem: Peripheral Neurovascular Impairment  Goal: Extremity color, movement, sensation are maintained or improved  Flowsheets (Taken 09/03/2022 1759)  Extremity color, movement, sensation are maintained or improved: Assess and monitor application of corrective devices (cast, brace, splint), check skin integrity     Problem: Compromised skin integrity  Goal: Skin integrity is maintained or improved  Flowsheets (Taken 09/03/2022 1759)  Skin integrity is maintained or improved:   Assess Braden Scale every shift   Turn or reposition patient every 2 hours or as needed unless able to reposition self   Increase activity as tolerated/progressive mobility     Problem: Nutrition  Goal: Nutritional intake is adequate  Flowsheets (Taken 09/03/2022 1759)  Nutritional intake is adequate: Consult/collaborate with Clinical Nutritionist     Problem: Discharge Barriers  Goal: Patient will be discharged home or other facility with appropriate resources  Flowsheets (Taken 09/03/2022 1759)  Discharge to home or other facility with appropriate resources:   Provide appropriate patient education   Provide information on available health resources     Problem: Inadequate Gas Exchange  Goal: Adequate oxygenation and improved ventilation  Flowsheets (Taken 09/03/2022 1759)  Adequate oxygenation and improved ventilation:   Assess lung sounds   Monitor SpO2 and treat as needed   Provide mechanical and oxygen  support to  facilitate gas exchange     Problem: Hemodynamic Status: Cardiac  Goal: Stable vital signs and fluid balance  Flowsheets (Taken 09/03/2022 1759)  Stable vital signs and fluid balance:   Assess signs and symptoms associated with cardiac rhythm changes   Monitor lab values

## 2022-09-03 NOTE — Progress Notes (Signed)
During dialysis, Pt is very weak, tachyonic  and lethargic. Pt had 3 doses of albumin and Midodrine total 15 mg. However, BP dropped 84/49. Called the Dr. Jerral Bonito. Notified Dr. Jerral Bonito and received the order "Stop HD" Hd stopped  16:35 Pm as doctor's order. Pt had 1.5 hrs of dialysis today. Total UF net : 250 ml. Chanetta Marshall cath dressing Clean, dry and intact. Secured with TEGO caps. Notified primary RN.      09/03/22 1635   Treatment Summary   Time Off Machine 1635   Duration of Treatment (Hours) 1.5   Treatment Type 1:1   Dialyzer Clearance Lightly streaked   Fluid Volume Off (mL) 751   Prime Volume (mL) 200   Rinseback Volume (mL) 200   Fluid Given: Normal Saline (mL) 0   Fluid Given: PRBC  0 mL   Fluid Given: Albumin (mL) 100   Fluid Given: Other (mL) 0   Total Fluid Given 500   Hemodialysis Net Fluid Removed 251   Post Treatment Assessment   Post-Treatment Weight (Kg) 79   Patient Response to Treatment hypotensive   Information for Next Treatment albumin, midodrin given   Additional Dialyzer Used no   Permacath Catheter - Tunneled 08/23/22 Subclavian vein catheter Right   Placement Date/Time: (c) 08/23/22 1047   Inserted by: Dr. Bonita Quin  Access Type: Subclavian vein catheter  Orientation: Right  Central Line Infection Prevention Education provided?: Yes  Hand Hygiene: Chlorhexidine;Alcohol based hand scrub;Soap and water  ...   Line necessity reviewed? Apheresis/hemodialysis   Site Assessment Clean;Intact;Dry   Dressing Status and Intervention Dressing Intact;Clean & Dry   Tego/Curos Caps on Catheter Yes   NEW Tego/Curos Caps placed (Date) 09/01/22   End Caps Free From Blood Yes   Line Care Connections checked and tightened   Dressing Type Transparent;Biopatch   Dressing Status Clean;Dry;Intact   Dressing/Line Intervention Caps changed   Line Used For Blood Draw No   Dressing Change Due 09/08/22   Vitals   Temp 98.2 F (36.8 C)   Pulse 95   Resp 26   BP 92/52   SpO2 96 %   O2 Device None (Room air)   Assessment    Mental Status Lethargic   Cardiac (WDL) WDL   Cardiac Regularity Return to [facility]   Cardiac Rhythm Normal sinus rhythm   Respiratory  X   Respiratory Pattern Tachypneic   Bilateral Breath Sounds Diminished   R Breath Sounds Diminished   L Breath Sounds Diminished   Cough Weak   Edema  X   Generalized Edema Non Pitting Edema   Facial Return to WDL   RLE Edema Non Pitting Edema   LLE Edema Non Pitting Edema   General Skin Color Appropriate for ethnicity   Skin Temp Dry;Warm   Gastrointestinal (WDL) X   Abdomen Inspection Rounded   Pain Assessment   Charting Type Assessment   Pain Scale Used Numeric Scale (0-10)   Numeric Pain Scale   Pain Score 0   Education   Person taught Patient   Knowledge basis None   Topics taught Procedure   Teaching Tools Explain   Reponse No Evidence of Learning   Bedside Nurse Communication   Name of bedside RN - pre dialysis violet,rn   Name of bedside RN - post dialysis violet,rn

## 2022-09-03 NOTE — Nursing Progress Note (Signed)
Report given to [place], Charity fundraiser. Patient left unit with all belongings.

## 2022-09-03 NOTE — Progress Notes (Addendum)
Pt will be d/c today after HD this pm to [id number], nursing report call to [phone]. Ambulance request is scheduled for 6 pm with pt's dtr agreement- 6:30 pm  confirmed. CM informed d/c plan with pt's dtr, [name]. Bedside RN has acknowledged d/c plan and transportation plan.     CM informed MD that pt will be d/c to [facility] right after HD. MD confirmed this d/c plan is still acceptable. CM contacted Virgia Land, liaison at [facility] to inform her of late d/c plan. She confirmed that is fine.       09/03/22 1056   Discharge Disposition   Patient preference/choice provided? Yes   Physical Discharge Disposition LTAC   Receiving facility, unit and room number: [facility]  Room 428   Nursing report phone number: [phone]  ML  Room 428   Mode of Transportation Ambulance   Pick up time after HD   Patient/Family/POA notified of transfer plan Yes   Patient agreeable to discharge plan/expected d/c date? Yes   Family/POA agreeable to discharge plan/expected d/c date? Yes   Bedside nurse notified of transport plan? Yes   Special requirements for patient during transport: Oxygen   Wound care post discharge? Yes   CM Interventions   Follow up appointment scheduled? No   Reason no follow up scheduled? Family to schedule   Notified MD? Yes   Multidisciplinary rounds/family meeting before d/c? Yes   Medicare Checklist   Is this a Medicare patient? Yes   Date of 2nd IMM Letter 08/29/22     Willow Ora BSN. RN. RRT  Care manager I  [facility]  [phone]. [phone]

## 2022-09-03 NOTE — Discharge Summary (Signed)
[facility]      Patient: Shelby Barron  Admission Date: 08/21/2022   DOB: 10-01-1940  Discharge Date: 09/03/2022    MRN: [id number]  Discharge Attending: Dionicia Abler, MD   Referring Physician: Patsy Lager, MD  PCP: Patsy Lager, MD       DISCHARGE SUMMARY     Discharge Information   Admission Diagnosis:   Malfunctioning HD catheter     Discharge Diagnosis:   Malfunctioning HD catheter  Non healing abdominal wound s/p wound vac  Aspiration Pneumonia  Malnutrition  Paroxysmal A Fib  Acute blood loss anemia 2/to abdominal incision and drainage   Poor prognosis  Hx of CVA  Body mass index is 28.84 kg/m.  Malnutrition Documentation    Severe Malnutrition related to inadequate oral intake in the setting of acute illness as evidenced by 9% weight loss x 1 month,  moderate muscle wasting (temporalis, pectoralis, deltoid) and moderate fat loss (orbital, buccal, triceps)        Admission Condition: poor  Discharge Condition: poor  Functional Status: Patient will not be independent with mobility/ambulation, transfers, ADL's, IADL's.    Discharge Medications:     Medication List        START taking these medications      metoprolol tartrate 25 MG tablet  Commonly known as: LOPRESSOR  Take 0.5 tablets (12.5 mg) by mouth every 12 (twelve) hours     oxyCODONE 5 MG immediate release tablet  Commonly known as: ROXICODONE  Take 1 tablet (5 mg) by mouth every 6 (six) hours as needed for Pain     piperacillin-tazobactam 2.25 (2-0.25) g injection  Commonly known as: ZOSYN  Infuse 2.25 g into the vein every 12 (twelve) hours for 2 days            CHANGE how you take these medications      apixaban 2.5 MG  Commonly known as: ELIQUIS  1 tablet (2.5 mg) by per NG tube route every 12 (twelve) hours  What changed: how to take this     * cinacalcet 30 MG tablet  Commonly known as: SENSIPAR  Take 1 tablet (30 mg) by mouth every evening  What changed: You were already taking a medication with the same name, and this  prescription was added. Make sure you understand how and when to take each.     * cinacalcet 30 MG tablet  Commonly known as: SENSIPAR  What changed: Another medication with the same name was added. Make sure you understand how and when to take each.     * megestrol acetate 400 MG/10ML Susp  Commonly known as: MEGACE  Take 10 mLs (400 mg) by mouth daily  What changed:   Another medication with the same name was added. Make sure you understand how and when to take each.  Another medication with the same name was removed. Continue taking this medication, and follow the directions you see here.     * megestrol acetate 400 MG/10ML Susp  Commonly known as: MEGACE  10 mLs (400 mg) by per NG tube route every morning with breakfast  Start taking on: September 04, 2022  What changed: You were already taking a medication with the same name, and this prescription was added. Make sure you understand how and when to take each.  Replaces: megestrol 40 MG tablet     polyethylene glycol 17 g packet  Commonly known as: MIRALAX  Take 17 g by mouth daily  What  changed: Another medication with the same name was removed. Continue taking this medication, and follow the directions you see here.           * This list has 4 medication(s) that are the same as other medications prescribed for you. Read the directions carefully, and ask your doctor or other care provider to review them with you.                CONTINUE taking these medications      acetaminophen 500 MG tablet  Commonly known as: TYLENOL     amiodarone 200 MG tablet  Commonly known as: PACERONE  Take 1 tablet (200 mg) by mouth daily This is a maintenance dose.     Anoro Ellipta 62.5-25 MCG/ACT Aepb  Generic drug: umeclidinium-vilanterol     atorvastatin 40 MG tablet  Commonly known as: LIPITOR     b complex-vitamin c-folic acid 0.8 MG Tabs  Take 1 tablet by mouth daily     * midodrine 10 MG tablet  Commonly known as: PROAMATINE  Take 1 tablet (10 mg) by mouth Once daily every  Tuesday, Thursday and Saturday morning     * midodrine 5 MG tablet  Commonly known as: PROAMATINE  Take 1 tablet (5 mg) by mouth 2 (two) times daily with meals     montelukast 10 MG tablet  Commonly known as: SINGULAIR     oxybutynin XL 5 MG 24 hr tablet  Commonly known as: DITROPAN-XL     pantoprazole 20 MG tablet  Commonly known as: PROTONIX     traMADol 50 MG tablet  Commonly known as: ULTRAM           * This list has 2 medication(s) that are the same as other medications prescribed for you. Read the directions carefully, and ask your doctor or other care provider to review them with you.                STOP taking these medications      carvedilol 12.5 MG tablet  Commonly known as: COREG     conjugated estrogens vaginal cream  Commonly known as: PREMARIN     megestrol 40 MG tablet  Commonly known as: MEGACE  Replaced by: megestrol acetate 400 MG/10ML Susp  You also have another medication with the same name that you need to continue taking as instructed.     ondansetron 4 MG tablet  Commonly known as: Zofran     sevelamer carbonate 800 MG tablet  Commonly known as: RENVELA               Where to Get Your Medications        Information about where to get these medications is not yet available    Ask your nurse or doctor about these medications  apixaban 2.5 MG  cinacalcet 30 MG tablet  megestrol acetate 400 MG/10ML Susp  metoprolol tartrate 25 MG tablet  oxyCODONE 5 MG immediate release tablet  piperacillin-tazobactam 2.25 (2-0.25) g injection          Patient Lines/Drains/Airways Status       Active PICC Line / CVC Line / PIV Line / Drain / Airway / Intraosseous Line / Epidural Line / ART Line / Line / Wound / Pressure Ulcer / NG/OG Tube       Name Placement date Placement time Site Days    Permacath Catheter - Tunneled 08/23/22 Subclavian vein catheter Right 08/23/22  1047  -- 11  Peripheral IV 20 G Standard Anterior;Right Wrist --  --  Wrist  --    Peripheral IV 08/27/22 18 G Standard Anterior;Proximal;Right  Upper Arm 08/27/22  0919  Upper Arm  7    Wound 06/30/22 Abdomen Anterior 06/30/22  1152  Abdomen  65    Wound 07/23/22 Skin Tear Buttocks;Coccyx Inner R and L cheek, coccyx 07/23/22  --  Buttocks;Coccyx  42    Feeding Tube ND 12 Fr. Left nare 08/31/22  1558  Left nare  2                       Hospital Course   Presentation History       See HPI for details.    Hospital Course (12 Days)     KATIUSKA MALINSKI is a 82 y.o. female with PMH AF on Eliquis, chronic HFpEF, HTN, HLD, h/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, [name], RCC s/p bilateral nephrectomy, urothelial CA s/p cystectomy c/b small bowel enterotomy s/p small bowel anastomosis and abdominal closure requiring wound vac, anemia of chronic disease, COPD, h/o T2DM (last A1c 5.6 in June 2024), dysphagia, gout, admitted with malfunctioning HD catheter with need for HD and non-healing abdominal surgical wound. Hospital course complicated by aspiration pneumonia.     Acute Hypoxic Respiratory Failure  Aspiration PNA (acute)  Volume overload  Acute Pulmonary edema   - Acute episode of nausea and vomiting, followed by pt becoming increasingly hypoxemic (08/27/22)  - Currently requiring 1L O2, at max was on 8L O2  - Telemetry monitoring. Continuous pulse ox.  - CXR 8/9 noted, RLL infiltrate  - DDimer 1.97. CT Angio Chest negative for PE, see report below.  - BNP was elevated, more fluid removed with HD  - Continue on IV Zosyn (renal dosing) till 09/05/22 per ID recs   - Strict NPO. SLP following. Patient continues to fail swallow eval. Will need VFSS prior to diet initiation.    - Palliative Care has seen this pt in past admissions, most recently in July 2024. Family not interested in palliative care or hospice  - Daughters want to continue aggressive measures, including artifical nutrition. Daughters want to proceed with feeding tube. Discussed about GOC and quality of life with patient's daughter today. She is aware of the poor prognosis but patient's  husband wants to continue all aggressive measures.   -Nutrition placed nasal bridle on 08/31/22 Cannot do PEG at this time due to extensive abd surgeries, and extensive wound with wound vac on abdomen  - Patient will need LTAC placement due to higher level of nursing care she needs     Paroxysmal A-Fib  - Given high aspiration risk, pt unable to safely take po meds   - Discontinued home po Coreg. Discontinued IV Metoprolol.  Resume Lopressor 12.5 mg PO BID  - Started on Amiodarone gtt per [facility] recs, received only a couple of hours of gtt prior to converting to SR, then it was discontinued 08/27/12  - Continue home Amiodarone   - Discontinued Heparin gtt, restarted Eliquis on 8/14  - If Hg is trending down to < 8 grams, will need to consider stopping anticoagulation  - Cardiology consulted, [facility], appreciate recommendations.        Elevated Troponin, most likely demand ischemia in setting of current infection and hx of ESRD  Unclear why this was sent overnight. As per nurse, night nurse noted T wave abnormality on monitor   EKG: NSR,  no acute St or T wave changes          Non-healing abdominal surgical wound s/p I&D 08/25/2022 by Dr. Morton Stall bowel enterotomy s/p small bowel anastomosis, abd closure, wound vac  - SIRS present on admission with persistent leukocytosis, tachycardia  - Appreciate Surgery and WOCN consult and recommendations after malfunctioning wound vac removed  - In OR, large mass of necrotic fat overlying pus pocket incised and drained, no cultures  - Received IV Vanco and CTX, discontinued, d/w Dr. Zollie Beckers [facility]SA 08/27/22, no abx needed   - Maintain wound vac     Acute blood loss anemia 2/to abdominal incision and drainage  Anemia of chronic kidney disease, stable  Iron deficiency anemia  Thrombocytosis, suspect reactive  - Hb dropped to 7, was given 1u PRBC 08/26/22 with good response   - No active signs of bleeding  - Iron studies noted  - On Aranesp per Nephro  - Monitor CBC  - If Hg is trending down to <  8 grams, will need to consider stopping anticoagulation     Malfunctioning HD Catheter  [name] MWF  H/o RCC s/p bilateral nephrectomy  Urothelial CA s/p cystectomy  - Appreciate IR and [facility] consult and recommendations  - S/p Permacath exchange by IR 08/23/22  - Continue MWF HD schedule  - Continue Midodrine for hypotension (hold for SBP>120)     Chronic HFpEF  HTN  HLD  H/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  - Discontinued po Carvedilol (npo status) --> Cont Lopressor through NGT  - Continue home Amiodarone   - Continue home Atorvastatin   - Discontinue Heparin gtt, transitioned to Eliquis      Deconditioned state s/p prolonged hospitalisation  - PT/OT recommend SNF. Patient likely will need LTAC placement given multiple issues and frequent nursing care including frequent suctioning of secretions   - Fall precautions, skin precautions       Severe Protein Calorie Malnutrition related to inadequate oral intake in the setting of acute illness as evidenced by 9% weight loss x 1 month,  moderate muscle wasting (temporalis, pectoralis, deltoid) and moderate fat loss (orbital, buccal, triceps)   - Appreciate Nutrition consult and recommendations  - Daughters want to proceed with feeding tube placement      Obesity Class I, BMI 30  - Lifestyle modifications impractical for largely bed-bound patient      Summary of Nutrition Recommendations:         1. NPO, diet advancement per SLP recs   2. Recommend modifying TF to Glucerna 1.5, goal of 45 ml/hr + 2 Pkts ProSource/d               - provides 1080 ml total volume, 1780 kcals, 129g protein, and 819 ml free H2O (100% of nutrition needs              - Flush tube with minimum of 30 ml water Q 4 hours for tube patency- additional fluids for hydration per MD   3. Continue Juven BID for wound healing  - Dilute Juven with 120 ml of water (4 oz) and administer via syringe down feeding tube.   - Flush feeding tube with 30 ml of water before  and after Juven administration.        POC and discharge discussed with patient's daughter over phone, bedside nurse and Dr. Delsa Grana at [facility].       Procedures/Imaging:   XR Abdomen Portable  Final Result         Feeding tube in stomach      Kristine Linea, MD   08/31/2022 6:01 PM      CT Angiogram Chest   Final Result         1. No pulmonary embolism.   2. Emphysema. Bibasilar lung consolidation.   3. Coronary calcification. Mild cardiomegaly.   4. Moderate hiatal hernia.   5. Ascites. Left posterior lower chest wall/abdominal wall hernia noted.      Nelta Numbers, MD   08/27/2022 6:56 PM      XR Chest AP Portable   Final Result         1. Increasing airspace opacity in the right lower lobe.   2. Stable pulmonary edema pattern.      Colonel Bald, MD   08/27/2022 6:47 AM      XR Chest AP Portable   Final Result      Mild interstitial edema.      Shelly Flatten, MD   08/26/2022 6:19 PM      Tunneled Cath Check/Change (Permcath)   Final Result      Right-sided PermCath exchange with new 14.5 French 19 cm PermCath with tip   in the right atrium.      PLAN:    Catheter ready for immediate use.      Darci Current, MD   08/23/2022 12:49 PM      XR Chest  AP Portable   Final Result      No significant change mild pulmonary vascular congestion and mild basal   atelectasis. Hiatal hernia.      Clide Cliff, MD   08/21/2022 2:35 PM          Treatment Team:   Attending Provider: Dionicia Abler, MD  Consulting Physician: Debbe Mounts, MD  Consulting Physician: Modlinger, Ollen Gross, MD  Consulting Physician: Lorayne Bender, MD  Consulting Physician: Christene Slates, MD  Consulting Physician: Creta Levin, MD               Progress Note/Physical Exam at Discharge     Subjective: Patient continues to aspirate on her saliva. Discussed about poor prognosis with patient's daughter.     Vitals:    09/03/22 0600 09/03/22 0749 09/03/22 1125 09/03/22 1232   BP:  106/65 93/59 (!) 88/50   Pulse:  90 83    Resp:   22    Temp:  97.7 F  (36.5 C) 99 F (37.2 C)    TempSrc:  Oral Oral    SpO2:  98% 93%    Weight: 78.6 kg (173 lb 4.5 oz)      Height:         GEN APPEARANCE: Elderly, ill appearing, non-verbal. Opens eyes to voice intermittently  HEENT: NC/AT; Conjunctiva Clear, Sclerae Anicteric; MM dry. + oral secretions, NGT  NECK: Supple  CVS: Irreg, Irreg, S1, S2; No Murmur  LUNGS: Bibasilar Rales, coarse breath sounds, intermittent tachypnea  ABD: Soft; ND; generalized TTP; Large non-healing abdominal surgical wound/incision with wound vac in place post-operatively  EXT: No LE edema  SKIN: dry skin  NEURO: Nonverbal and not following commands on exam; opens eyes to voice. Neuro exam limited          Diagnostics     Last Labs   Recent Labs   Lab 09/03/22  0318 09/02/22  0341 09/01/22  0433   WBC 22.39* 19.89* 19.64*   RBC 2.87*  2.99* 2.93*   Hemoglobin 8.5* 8.8* 8.8*   Hematocrit 26.6* 27.8* 27.3*   MCV 92.7 93.0 93.2   Platelet Count 347* 296 310       Recent Labs   Lab 09/03/22  0318 09/02/22  0341 09/01/22  0433 08/31/22  0406 08/30/22  0549 08/29/22  0345 08/28/22  0341   Sodium 139 136 137  --  139 142 142   Potassium 4.3 3.5 3.5  --  3.9 3.3* 3.7   Chloride 104 100 100  --  103 104 104   CO2 22 26 24   --  23 25 23    BUN 39* 21 24*  --  36* 27* 17   Creatinine 3.3* 2.4* 3.3*  --  4.9* 4.2* 3.4*   Glucose 258* 235* 216*  --  93 72 111*   Calcium 11.2* 9.7 9.9  --  9.5 10.2 10.0   Magnesium  --  1.8  --  1.9 1.9 1.9 1.9       Microbiology Results (last 15 days)       Procedure Component Value Units Date/Time    COVID-19 (SARS-COV-2) ([facility] Standard test) [[id number]]  (Normal) Collected: 08/31/22 1909    Order Status: Completed Specimen: Swab from Anterior Nares Updated: 09/02/22 1323     SARS-CoV-2 (COVID-19) RNA Not Detected     Comment:   A result of "Detected" indicates Positive for the presence of SARS-CoV-2 RNA  A result of "Not Detected" indicates Negative for the presence of SARS-CoV-2 RNA  A result of "Presumptive Positive" indicates  Positive for the presence of SARS-CoV-2 RNA    Test performed using the Roche cobas SARS-CoV-2 assay.  This assay is only for use under the Food and Drug Administration's Emergency Use Authorization. This is a real-time RT-PCR assay for the qualitative detection of SARS-CoV-2 (COVID-19) RNA. Viral nucleic acids may persist in vivo, independent of viability. Detection of viral nucleic acid does not imply the presence of infectious virus, or that virus nucleic acid is the cause of clinical symptoms.   Test performance has not been established for immunocompromised patients or patients without signs and symptoms of respiratory infection. Negative results do not preclude SARS-CoV-2 infection and should not be used as the sole basis for patient management decisions. Invalid results may be due to inhibiting substances in the specimen and recollection should occur.    Please see Fact Sheets for patients and providers located at: http://www.rice.biz/       Culture, Methicillin Resistant Staphylococcus aureus (MRSA) [[id number]]  (Normal) Collected: 08/30/22 1441    Order Status: Completed Specimen: Swab from Nares Updated: 08/31/22 1823     Culture MRSA Surveillance No Methicillin Resistant Staphylococcus aureus isolated    Culture, Methicillin Resistant Staphylococcus aureus (MRSA) [[id number]]  (Normal) Collected: 08/30/22 1441    Order Status: Completed Specimen: Swab from Throat Updated: 08/31/22 1823     Culture MRSA Surveillance No Methicillin Resistant Staphylococcus aureus isolated             Patient Instructions   Discharge Diet: NPO, tube feeds through NGT  Discharge Activity:  bedrest  Labs/Studies Pending at Discharge: No  Follow-up instructions to patient:  Follow Up Appointment:   Follow-up Information       [facility]. Schedule an appointment as soon as possible for a visit in 1 week(s).    Specialty: Family Medicine  Contact information:  [address]  [place] [address]  [phone]  Additional  information:  From the [place]:   Take [address]. Take [address] to merge onto [address] toward [place].    Turn right onto [address]. Your destination will be on the left.       From the [place]:   Take [address] towards [address]. Merge onto [address]. Take exit [address] to merge onto [address]/   [facility] toward [address]. Turn right onto [address]. Your destination will be on the left.                             Time spent examining patient, discussing with patient/family regarding hospital course, chart review, reconciling medications and discharge planning: 60 minutes.    Signed,  Dionicia Abler, MD  2:55 PM 09/03/2022

## 2022-09-03 NOTE — Progress Notes (Signed)
Nutritional Support Services  Nutrition Follow Up    Shelby Barron 82 y.o. female   MRN: [id number]    Summary of Nutrition Recommendations:       1. NPO, diet advancement per SLP recs   2. Recommend modifying TF to Glucerna 1.5, goal of 45 ml/hr + 2 Pkts ProSource/d               - provides 1080 ml total volume, 1780 kcals, 129g protein, and 819 ml free H2O (100% of nutrition needs              - Flush tube with minimum of 30 ml water Q 4 hours for tube patency- additional fluids for hydration per MD   3. Continue Juven BID for wound healing  - Dilute Juven with 120 ml of water (4 oz) and administer via syringe down feeding tube.   - Flush feeding tube with 30 ml of water before and after Juven administration.  -----------------------------------------------------------------------------------------------------------------                                                        Assessment Data:   Nutrition: Pt seen at bedside. Unable to participate in assessment. No family present. TF running as ordered, meeting 100% of nutrition needs. Noted BG levels elevated while on TF. Pt started on low dose PSSI Q4H. Renal labs WNL/low. Recommend also modifying TF to Glucerna 1.5, see full recs above- discussed with MD. No documentation of TF intolerance. GI Status: Last BM Date: 09/01/22.    Hospital Admission: 82 y.o. female with PMH AF on Eliquis, chronic HFpEF, HTN, HLD, h/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 during complicated admission at [facility], [name], RCC s/p bilateral nephrectomy, urothelial CA s/p cystectomy c/b small bowel enterotomy s/p small bowel anastomosis and abdominal closure requiring wound vac, anemia of chronic disease, COPD, h/o T2DM, dysphagia, gout, admitted with malfunctioning HD catheter with need for HD and non-healing abdominal surgical wound. S/p debridement 8/7. HD to continue as scheduled MWF. Episode of nausea/vomiting and hypoxic respiratory failure 08/26/22, likely aspiration  pneumonia. SLP continuing to recommend NPO/ alternate feeding. Consult received for feeding tube placement and recs- initiated 8/13. Longterm feeding tube considered, although cannot place PEG at this time due to extensive abd surgeries, and extensive wound with wound vac on abdomen per MD. Troponin now trending upward causing a delay it pt's d/c to LTAC @ [facility].     Medical Hx:  has a past medical history of Acid reflux, Asthma, well controlled, Cancer of kidney, Chronic lower back pain, Congestive heart disease, COPD (chronic obstructive pulmonary disease), CVA (cerebral infarction) (01/12/2003), Diabetes, Diabetic nephropathy, Fibroids, Gout, H/O: gout, Hyperlipidemia, Hypertension, Neuropathy of hand, and SOB (shortness of breath).    PSH: has a past surgical history that includes Hernia repair; APPENDECTOMY (OPEN); Hysterectomy; tumor removed from kidney Marletta Lor 2015); Partial nephrectomy; TURBT; Drain (Other) (N/A, 05/31/2022); Tunneled Cath Placement (Permcath) (N/A, 06/01/2022); CYSTECTOMY, ILEOCONDUIT (N/A, 06/28/2022); LAPAROTOMY, NEPHRECTOMY (Bilateral, 06/28/2022); EXPLORATORY LAPAROTOMY, RESECTION SMALL BOWEL (N/A, 06/28/2022); WOUND VAC APPLICATION (N/A, 06/28/2022); CLOSURE, ENTEROTOMY, SMALL INTESTINE (06/28/2022); EXPLORATORY LAPAROTOMY (N/A, 06/30/2022); WOUND VAC APPLICATION (N/A, 06/30/2022); Tunneled Cath Removal (Permcath) (Right, 07/14/2022); Tunneled Cath Placement (Permcath) (Right, 07/14/2022); Tunneled Cath Check/Change (Permcath) (Right, 08/23/2022); DEBRIDEMENT & IRRIGATION, WOUND (N/A, 08/25/2022); and WOUND VAC  APPLICATION (N/A, 08/25/2022).     Orders Placed This Encounter   Procedures    Diet NPO effective now Except for: ICE CHIPS (sips by tsp when alert), OTHER (SEE COMMENTS)    Ensure Plus High Protein Supplement Quantity: A. One; Flavor: Strawberry; Frequency: TID (3 times a day) with meals    Juven Quantity: A. One; Frequency: BID (2 times a day) - with Breakfast and Dinner     Tube feeding-Continuous     Intake:  NPO    Current enteral nutrition order:  Nepro @ 40 ml hr + 2 Pkts ProSource/d + 30 ml FWF Q4H   - provides 960 ml total volume, 1888 kcals, 118g protein, and 697 ml free H2O (100% of nutrition needs     ANTHROPOMETRIC  Height: 165.1 cm (5\' 5" )  Weight: 78.6 kg (173 lb 4.5 oz)     Weight Change: -2.36    Body mass index is 28.84 kg/m.       ESTIMATED NEEDS    Total Daily Energy Needs: 1572 to 1965 kcal  Method for Calculating Energy Needs: 20 kcal - 25 kcal per kg  at 78.6 kg (Actual body weight)  Rationale: -       Total Daily Protein Needs: 117.9 to 141.48 g  Method for Calculating Protein Needs: 1.5 g - 1.8 g per kg at 78.6 kg (Actual body weight)  Rationale: wound, HD      Total Daily Fluid Needs: 1572 to 1965 ml  Method for Calculating Fluid Needs: 20 ml - 25 ml  per kg at 78.6 kg (Actual body weight)         Current Meds:  amiodarone, 200 mg, Daily  apixaban, 2.5 mg, Q12H SCH  atorvastatin, 40 mg, Daily  b complex-vitamin c-folic acid, 1 tablet, Daily  cinacalcet, 30 mg, QPM  darbepoetin alfa, 0.45 mcg/kg, Weekly  insulin lispro, 1-5 Units, Q4H SCH  megestrol, 400 mg, QAM W/BREAKFAST  metoprolol tartrate, 12.5 mg, Q12H SCH  midodrine, 5 mg, BID Meals  montelukast, 10 mg, QHS  oxybutynin XL, 5 mg, Daily  pantoprazole, 40 mg, Daily  petrolatum, , Q12H SCH  piperacillin-tazobactam, 2.25 g, Q12H  potassium & sodium phosphates, 1 packet, BID  umeclidinium-vilanterol, 1 puff, QAM          Allergies[1]    Pertinent labs:  Recent Labs   Lab 09/03/22  0318 09/02/22  0341 09/01/22  0433 08/31/22  0406 08/30/22  0549 08/29/22  0345 08/29/22  0344 08/28/22  0341   Sodium 139 136 137  --  139 142  --  142   Potassium 4.3 3.5 3.5  --  3.9 3.3*  --  3.7   Chloride 104 100 100  --  103 104  --  104   CO2 22 26 24   --  23 25  --  23   BUN 39* 21 24*  --  36* 27*  --  17   Creatinine 3.3* 2.4* 3.3*  --  4.9* 4.2*  --  3.4*   Glucose 258* 235* 216*  --  93 72  --  111*   Calcium 11.2* 9.7  9.9  --  9.5 10.2  --  10.0   Magnesium  --  1.8  --  1.9 1.9 1.9  --  1.9   Phosphorus  --  1.4*  --  1.9*  --  2.9  --  2.3   GFR 13.2* 19.3* 13.2*  --  8.2* 9.9*  --  12.7*   WBC 22.39* 19.89* 19.64* 21.28* 22.12*  --   More results in Results Review 18.28*   Hematocrit 26.6* 27.8* 27.3* 26.2* 28.3*  --   More results in Results Review 25.5*   Hemoglobin 8.5* 8.8* 8.8* 8.3* 9.2*  --   More results in Results Review 8.3*   More results in Results Review = values in this interval not displayed.                                                                 Nutrition Diagnosis      Severe Malnutrition related to inadequate oral intake in the setting of acute illness as evidenced by 9% weight loss x 1 month, moderate muscle wasting (temporalis, pectoralis, deltoid) and moderate fat loss (orbital, buccal, triceps) - active                                                               Intervention     Nutrition recommendation - Please refer to top of note                                                                Monitoring/Evaluation   Monitor PO intake, weights, labs, GI function, medical treatment plan      Goals: Pt will meet >75% of estimated needs by next RD followup.       Levin Erp, RDN  Clinical Dietitian PRN         [1]   Allergies  Allergen Reactions    Metrizamide Shortness Of Breath and Nausea Only    Rosuvastatin Other (See Comments)     myopathy    myopathy   myopathy    myopathy myopathy      myopathy    myopathy, myopathy    Pioglitazone      Weight gain    Sulfa Antibiotics Hives     blister

## 2022-09-03 NOTE — Progress Notes (Signed)
Wound Ostomy Continence Consultation / Progress Note    Date Time: 09/03/22 11:06 AM  Patient Name: Shelby Barron, Shelby Barron  Consulting Service: [facility] Day: 14     Reason for Consult / Follow Up   Follow Up Visit     Assessment & Plan   Assessment:   Patient very lethargic, minimally responsive. Frequent coughing. Respirations labored. Patient overall appears declining.   Abdominal wound dressing removed. Wound remains malodorous. There continues to be necrotic tissue and adherent slough in the wound bed, small amount of granular red beefy tissue. Wound bed cleansed with 1/4 Dakins, dakins soaked gauze roll fluffed into wound bed and covered with 3 sacral foams.  Per primary RN, patient planned for discharge to LTAC today.        Wound 06/30/22 Abdomen Anterior (Active)   Date First Assessed/Time First Assessed: 06/30/22 1152   Wound Type: Surgical Incision  Location: Abdomen  Wound Location Orientation: Anterior      Assessments 09/03/2022 11:00 AM   Wound Image     Wound Base Description Adipose;Agranular;Brown;Dark edges;Fibrin/slough;Moist;Necrotic;Pink;Red;Tan;White   % Granular 40   % Non-viable 60   Peri-wound Description Brown;Dark edges;Intact;Hyperpigmented;Pink   Wound Length (cm) 18 cm   Wound Width (cm) 15 cm   Wound Depth (cm) 4.2 cm   Wound Surface Area (cm^2) 270 cm^2   Wound Volume (cm^3) 1134 cm^3   Closure Open   Drainage Amount Moderate   Drainage Description Malodorous;Purulent;Thick;Tan/Brown   Treatments Cleansed;Dressing changed;Wound Cleanser   Topical Dakins Solution   Dressing Thin Stretch Gauze Roll;Bordered Foam        Plan/Follow-Up:   Recommend to continue PI prevention interventions.  Recommend to continue NPWT holiday and continue 1/4 dakins moistened gauze dressings Q12H to help with the malodor and slough/necrotic tissue.  Discussed with primary RN. WOC service will continue to follow and assist as needed.    Patient Education:  N/A    History of Present Illness   This is a  82 y.o. female  has a past medical history of Acid reflux, Asthma, well controlled, Cancer of kidney, Chronic lower back pain, Congestive heart disease, COPD (chronic obstructive pulmonary disease), CVA (cerebral infarction) (01/12/2003), Diabetes, Diabetic nephropathy, Fibroids, Gout, H/O: gout, Hyperlipidemia, Hypertension, Neuropathy of hand, and SOB (shortness of breath)..  Admitted with Hemodialysis catheter dysfunction, initial encounter.        Procedure(s):  DEBRIDEMENT & IRRIGATION, WOUND  WOUND VAC APPLICATION    11 Days Post-Op  -------------------    Procedure(s):  DEBRIDEMENT & IRRIGATION, WOUND  WOUND VAC APPLICATION    9 Days Post-Op  -------------------        Lowry Ram BSN, RN, CWOCN  Wound and Ostomy Service  [phone]

## 2022-09-03 NOTE — Progress Notes (Signed)
[facility] PROGRESS NOTE  [phone]      Date Time: 09/03/22 9:39 AM  Patient Name: Shelby Barron  Attending Physician: Dionicia Abler, MD    CC: follow-up ESRD    Assessment:   [name] MWF via [facility]  Malfunctioning permcath - s/p replacement 08/23/22 and now functioning without issues  Anemia due to CKD - Hgb below goal and ferritin elevated  Deconditioning s/p prolonged hospitalization at [facility]  Abdominal wound s/p debridement and irrigation and remains with wound vac in place   Aspiration PNA - on abx, worsening leucocytosis  Hypotension - now on standing midodrine  Hypercalcemia - ? Exacerbated by immobility - on cinacalcet; may need to adjust dose pending review of calcium trend    Recommendations:     HD today; noted to have intermittently low BP - will resume additional PRN midodrine prior to HD to allow UF  Phos packets per feeding tube - check phos tomorrow  Continue cinacalcet; check PTH and may need to adjust if Ca remains elevated  Continue ESA    Case discussed with: patient      Joetta Manners, MD  [facility]  [phone] (office)      Subjective:   Lethargic, resting in bed. Briefly opens eyes but does not converse.      Physical Exam:     Vitals:    09/02/22 2334 09/03/22 0335 09/03/22 0600 09/03/22 0749   BP: 100/65 109/67  106/65   Pulse: 82 86  90   Resp: 18 18     Temp: 97.3 F (36.3 C) 97.2 F (36.2 C)  97.7 F (36.5 C)   TempSrc:    Oral   SpO2: 95% 95%  98%   Weight:   78.6 kg (173 lb 4.5 oz)    Height:           Intake and Output Summary (Last 24 hours) at Date Time  No intake or output data in the 24 hours ending 09/03/22 0939          General: no acute distress, arouses to voice  HEENT: NGT in place  Cardiovascular: S1 and S2, no MRG  Lungs: clear to auscultation anteriorly with diminished respiratory effort  Abdomen: soft  Extremities:No LE edema  RUE with ecchymoses  R TDC    Meds:      Scheduled Meds: PRN Meds:    amiodarone, 200 mg,  per NG tube, Daily  apixaban, 2.5 mg, per NG tube, Q12H SCH  atorvastatin, 40 mg, per NG tube, Daily  b complex-vitamin c-folic acid, 1 tablet, Oral, Daily  cinacalcet, 30 mg, per NG tube, QPM  darbepoetin alfa, 0.45 mcg/kg, Subcutaneous, Weekly  insulin lispro, 1-5 Units, Subcutaneous, Q4H SCH  megestrol, 400 mg, per NG tube, QAM W/BREAKFAST  metoprolol tartrate, 12.5 mg, per NG tube, Q12H SCH  midodrine, 5 mg, per NG tube, BID Meals  montelukast, 10 mg, per NG tube, QHS  oxybutynin XL, 5 mg, Oral, Daily  pantoprazole, 40 mg, Oral, Daily  petrolatum, , Topical, Q12H SCH  piperacillin-tazobactam, 2.25 g, Intravenous, Q12H  potassium & sodium phosphates, 1 packet, Oral, BID  umeclidinium-vilanterol, 1 puff, Inhalation, QAM           sodium chloride, , PRN  acetaminophen, 650 mg, Q4H PRN  benzocaine-menthol, 1 lozenge, Q2H PRN  benzonatate, 100 mg, TID PRN  carboxymethylcellulose sodium, 1 drop, TID PRN  dextrose, 15 g of glucose, PRN   Or  dextrose, 12.5 g,  PRN   Or  dextrose, 12.5 g, PRN   Or  glucagon (rDNA), 1 mg, PRN  guaiFENesin, 400 mg, Q8H PRN  lidocaine 2% jelly, , Once PRN  melatonin, 3 mg, QHS PRN  morphine, 2 mg, Q4H PRN  naloxone, 0.2 mg, PRN  ondansetron, 4 mg, Q8H PRN  oxyCODONE, 5 mg, Q6H PRN  polyethylene glycol, 17 g, Daily PRN  saline, 2 spray, Q4H PRN              Labs:     Recent Labs   Lab 09/03/22  0318 09/02/22  0341 09/01/22  0433   WBC 22.39* 19.89* 19.64*   Hemoglobin 8.5* 8.8* 8.8*   Hematocrit 26.6* 27.8* 27.3*   Platelet Count 347* 296 310     Recent Labs   Lab 09/03/22  0318 09/02/22  0341 09/01/22  0433 08/31/22  0406 08/30/22  0549 08/29/22  0345 08/28/22  0341   Sodium 139 136 137  --  139 142 142   Potassium 4.3 3.5 3.5  --  3.9 3.3* 3.7   Chloride 104 100 100  --  103 104 104   CO2 22 26 24   --  23 25 23    BUN 39* 21 24*  --  36* 27* 17   Creatinine 3.3* 2.4* 3.3*  --  4.9* 4.2* 3.4*   Calcium 11.2* 9.7 9.9  --  9.5 10.2 10.0   Albumin  --   --   --   --   --  2.4* 3.2*   Phosphorus   --  1.4*  --  1.9*  --  2.9 2.3   Magnesium  --  1.8  --  1.9 1.9 1.9 1.9   Glucose 258* 235* 216*  --  93 72 111*   GFR 13.2* 19.3* 13.2*  --  8.2* 9.9* 12.7*             Invalid input(s): "LEUKOCYTESUR"        Imaging personally reviewed, including: No results found.        Signed by: Joetta Manners, MD

## 2022-09-03 NOTE — Discharge Instr - AVS First Page (Addendum)
Reason for your Hospital Admission:  Acute hypoxic respiratory failure  Aspiration Pneumonia  Atrial fibrillation  ESRD      Instructions for after your discharge:  Follow up with PCP and Cardiology.

## 2022-09-03 NOTE — Progress Notes (Signed)
Patient's condition declined during shift, hypotensive during shift as well. This evening Dialysis RN had to stop HD due to hypotension even after medicating patient with albumin and midodrine. RRT called at 1701 due to labored breathing and fixed upper left eye gaze. MD called Daughter and explained patient's poor prognosis decision was made by daughter to change patient's code status and transition patient to comfort care measures.

## 2022-09-03 NOTE — Progress Notes (Signed)
Unable to present IMM/Acknowledgment of Outpatient/Observation Status Form or OBS letter in person due to (check what applies):    Patient is on COVID/TB isolation ____  Patient is confused/intubated/unable to participate __x____      Sherron Monday to patient/family member [name] (Daughter) via phone to review 2nd IMM who acknowledged and verbalized understanding of the letter.  A copy of 2nd IMM will be mailed to patient's residence.

## 2022-09-03 NOTE — Plan of Care (Signed)
Problem: Renal Instability  Goal: Fluid and electrolyte balance are achieved/maintained  Flowsheets (Taken 09/03/2022 1543)  Fluid and electrolyte balance are achieved/maintained:   Monitor/assess lab values and report abnormal values   Assess and reassess fluid and electrolyte status   Observe for cardiac arrhythmias   Monitor for muscle weakness

## 2022-09-03 NOTE — Progress Notes (Signed)
Order Number: [id number]  MMT [phone]  Transport Mode: Ambulance BLS  Order Type: One Way  Appointment Info:   Reason for Transport:  Admission / Discharge -  Inter-Facility Transfer  Type: Inter-Facility Transfer  Transfer Reason: Higher Level of Care  Notes: HD cath dysfunction  Date: Friday, August 16th, 2024   Pick Up: 18:30 [place] Time    Notified Willow Ora, CM    Rhea Belton, [place]

## 2022-09-03 NOTE — Nursing Progress Note (Signed)
RAPID RESPONSE NOTE    Patient Name: Shelby Barron (82 y.o., female)  Admission Date: 08/21/2022 [facility] Day 12)  Attending Provider: Dionicia Abler, MD    Active Hospital Problems    Diagnosis    Hemodialysis catheter dysfunction, initial encounter    CKD (chronic kidney disease) requiring chronic dialysis       Rapid Response Activation: 1701  Rapid Response RN Arrival: 1701  Patient Location: [id number]/E454-09     Background:   Patient with increasing altered mental status and hypotension after dialysis     Situation on Arrival:   Patient with fixed upper left gaze and labored breathing. Oxygen saturation 96% on room air. BP 102/55.  HR 102.  Patient attached to [email or web address] tele monitoring showing agonal rhythm with weak pulse that resolved to sinus rhythm.  No chest compressions needed.  During this time, Patient apneic and bag mask ventilation initiated by RT Trey Paula. Oxygen saturation maintained above 94%. Dr Reita May spoke with family for goals of care. Patient will transition to comfort care and is now DNR/DNI     Actions of Rapid Response Team:   Bag mask ventilation  Transition to comfort care measures due to poor prognosis    Call Finished: 1739     Patient Disposition after call completed:   Patient's respirations unlabored. Vital signs remain stable. Patient repositioned. Dr Reita May spoke with family regarding plan of care

## 2022-09-03 NOTE — Plan of Care (Signed)
Problem: Renal Instability  Goal: Fluid and electrolyte balance are achieved/maintained  Outcome: Progressing  Flowsheets (Taken 09/03/2022 0036)  Fluid and electrolyte balance are achieved/maintained:   Monitor/assess lab values and report abnormal values   Assess and reassess fluid and electrolyte status   Observe for cardiac arrhythmias   Monitor for muscle weakness     Problem: Inadequate Gas Exchange  Goal: Adequate oxygenation and improved ventilation  Outcome: Progressing  Flowsheets (Taken 09/03/2022 0036)  Adequate oxygenation and improved ventilation:   Assess lung sounds   Monitor SpO2 and treat as needed   Provide mechanical and oxygen support to facilitate gas exchange   Position for maximum ventilatory efficiency   Plan activities to conserve energy: plan rest periods   Increase activity as tolerated/progressive mobility     Problem: Hemodynamic Status: Cardiac  Goal: Stable vital signs and fluid balance  Outcome: Progressing  Flowsheets (Taken 09/03/2022 0036)  Stable vital signs and fluid balance:   Assess signs and symptoms associated with cardiac rhythm changes   Monitor lab values     Problem: Moderate/High Fall Risk Score >5  Goal: Patient will remain free of falls  Outcome: Progressing  Flowsheets (Taken 09/02/2022 1942)  High (Greater than 13):   HIGH-Consider use of low bed   HIGH-Apply yellow "Fall Risk" arm band   HIGH-Bed alarm on at all times while patient in bed   HIGH-Visual cue at entrance to patient's room     Problem: Compromised Friction/Shear  Goal: Friction and Shear Interventions  Outcome: Progressing  Flowsheets (Taken 09/03/2022 0036)  Friction and Shear Interventions: Pad bony prominences, Off load heels, HOB 30 degrees or less unless contraindicated, Consider: TAP seated positioning, Heel foams

## 2022-09-04 MED ORDER — MORPHINE SULFATE 2 MG/ML IJ/IV SOLN (WRAP)
2.0000 mg | Freq: Once | Status: AC
Start: 2022-09-04 — End: 2022-09-04
  Administered 2022-09-04: 2 mg via INTRAVENOUS
  Filled 2022-09-04: qty 1

## 2022-09-04 MED ORDER — KETOROLAC TROMETHAMINE 30 MG/ML IJ SOLN
30.0000 mg | Freq: Once | INTRAMUSCULAR | Status: AC
Start: 2022-09-04 — End: 2022-09-04
  Administered 2022-09-04: 30 mg via INTRAVENOUS
  Filled 2022-09-04: qty 1

## 2022-09-04 MED ORDER — GLYCOPYRROLATE 0.2 MG/ML IJ SOLN (WRAP)
0.4000 mg | Freq: Four times a day (QID) | INTRAMUSCULAR | Status: DC
Start: 2022-09-04 — End: 2022-09-05
  Administered 2022-09-04: 0.4 mg via INTRAVENOUS
  Filled 2022-09-04 (×2): qty 2

## 2022-09-04 MED ORDER — MORPHINE SULFATE 2 MG/ML IJ/IV SOLN (WRAP)
2.0000 mg | Status: DC | PRN
Start: 2022-09-04 — End: 2022-09-05
  Administered 2022-09-04 (×4): 2 mg via INTRAVENOUS
  Filled 2022-09-04 (×4): qty 1

## 2022-09-04 MED ORDER — ATROPINE SULFATE 1 % SL SOLN
1.0000 [drp] | Freq: Three times a day (TID) | SUBLINGUAL | Status: DC | PRN
Start: 2022-09-04 — End: 2022-09-05
  Filled 2022-09-04 (×3): qty 1
  Filled 2022-09-04: qty 5

## 2022-09-04 MED ORDER — GLYCOPYRROLATE 0.2 MG/ML IJ SOLN (WRAP)
0.4000 mg | Freq: Three times a day (TID) | INTRAMUSCULAR | Status: DC
Start: 2022-09-04 — End: 2022-09-04
  Administered 2022-09-04: 0.4 mg via INTRAVENOUS
  Filled 2022-09-04: qty 2

## 2022-09-04 MED ORDER — MORPHINE SULFATE 2 MG/ML IJ/IV SOLN (WRAP)
0.5000 mg | Freq: Once | Status: AC
Start: 2022-09-04 — End: 2022-09-04
  Administered 2022-09-04: 0.5 mg via INTRAVENOUS
  Filled 2022-09-04: qty 1

## 2022-09-05 NOTE — Progress Notes (Signed)
Pt daughter came to unit, pick up pt's belongings. Funeral binder given with [facility] contact information. Family will choose a funeral home and will hospital know.

## 2022-09-05 NOTE — Nursing Progress Note (Signed)
Received a phone call from daughter, this RN informed the daughter of pt's passing away. Body release consent obtained over the phone.

## 2022-09-13 NOTE — Provider Clarification Note (Signed)
Patient Name: Shelby Barron, Shelby Barron  Account #: [id number]   MR #: [id number]  Discharge Date: 09/11/22    Dear Dr. Tommy Rainwater. Zollie Beckers,    A debridement is documented [insert date, location].  Additional clarification regarding the procedure is requested.    History/Risk Factors: 82 y.o. female is admitted with a Malfunctioning Hemodialysis Catheter and is found to have a Surgical Abdominal wound Breakdown.    Clinical Indicators: 09/02/2022 WBC 16.33 and 08/22/22 16.64    Treatment: Debridement and Irrigation of Abdominal Wound     Question: Please clarify the type of procedure performed:    Excisional debridement (the removal of necrotic, devitalized tissue or slough by means of cutting away of tissue)     Non-excisional debridement (the removal of necrotic, devitalized tissue or slough by means of flushing, brushing, washing, irrigation)    Other (please specify)     Unable to determine     Question: Please clarify the depth of the debridement:      Debridement down to and including skin/subcutaneous  Debridement down to and including fascia   Debridement down to and including tendon  Debridement down to and including muscle  Debridement down to and including bone    Thank you,    April S Henderson  [email or web address]  Date:  09/09/2022     Five elements required for accurate and compliant documentation of a  debridement:  Technique used (e.g., excisional, excised, cutting, brushing, jet lavage etc.)  Instrument(s) used (e.g., scalpel, curette, etc.)  Ashby Dawes of the tissue removed (e.g., necrotic, devitalized tissues, non-viable tissue, etc.)  Appearance and size of the wound (e.g., down to fresh bleeding tissue, 7cm x 10cm, etc.)  Depth of the debridement (e.g., skin, subcutaneous tissue, fascia, muscle, bone,etc>)    PROVIDER RESPONSE (Choose from list above or add free text): Excisional debridement of skin and subcutaneous tissue.

## 2022-09-19 NOTE — Progress Notes (Signed)
CASE MANAGEMENT PROGRESS NOTE      Patient: Shelby Barron (82 y.o. female)  Admission Date: 08/21/2022 [facility] Day 13)    Active Hospital Problems    Diagnosis    Hemodialysis catheter dysfunction, initial encounter    CKD (chronic kidney disease) requiring chronic dialysis       Length of stay: Hospital Day 13    Received referral from MD for GIP. Alerted [facility] and [facility] to ascertain if they have RN staff available to evaluate [name].    Joseph Berkshire, LCSW  [phone]

## 2022-09-19 NOTE — Progress Notes (Signed)
Unable to present IMM/Acknowledgment of Outpatient/Observation Status Form or OBS letter in person due to (check what applies):    Patient is on COVID/TB isolation _N/A___  Patient is confused/intubated/unable to participate ___X___      Sherron Monday to patient/family member Sylar Jansen patients daughter via phone to review 3rd IMM Letter who acknowledged and verbalized understanding of the letter.  A copy of 3rd IMM Letter will be mailed to patient's residence.      Prudencio Pair, CMS

## 2022-09-19 NOTE — Progress Notes (Signed)
[facility]  HOSPITALIST  PROGRESS NOTE      Patient: Shelby Barron  Date: 08/24/2022   LOS: 13 Days  Admission Date: 08/21/2022   MRN: [id number]  Attending:  Dionicia Abler, MD     ASSESSMENT/PLAN     TYMEISHA DUMOULIN is a 82 y.o. female admitted with       Acute metabolic encephalopathy s/p RRT on 8/16  Hypotension  Acute Hypoxic Respiratory Failure  Aspiration PNA (acute)  Volume overload  Acute Pulmonary edema   Paroxysmal A-Fib   Elevated Troponin, most likely demand ischemia in setting of current infection and hx of ESRD   Non-healing abdominal surgical wound s/p I&D 08/25/2022 by Dr. Morton Stall bowel enterotomy s/p small bowel anastomosis, abd closure, wound vac  Acute blood loss anemia 2/to abdominal incision and drainage  Anemia of chronic kidney disease, stable  Iron deficiency anemia  Thrombocytosis, suspect reactive  Malfunctioning HD Catheter  [name] MWF  H/o RCC s/p bilateral nephrectomy  Urothelial CA s/p cystectomy  Chronic HFpEF  HTN  HLD  H/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  Deconditioned state s/p prolonged hospitalisation   Severe Protein Calorie Malnutrition       Patient transitioned to CMO yesterday  Increase morphine drip 1-->2 mg/hr  Cont versed IV prn  Added morphine prn  Cont scopolamine patch  Add Rubinol  Consult placed for hospice, GIP eval  Overall poor prognosis, death is expected this admission.       POC discussed extensively with patient' daughter, [name] at bedside and nurse, Press photographer.       Safety Checklist  DVT prophylaxis: None, CMO   Foley: Not present   IVs:  Peripheral IV   PT/OT: Not needed   Daily CBC & or Chem ordered: No     Analgesia: morphine intravenous    FEN  Fluids: none  Electrolytes: noted  Nutrition: NPO  Body mass index is 28.84 kg/m.   Malnutrition Documentation    Severe Malnutrition related to inadequate oral intake in the setting of acute illness as evidenced by 9% weight loss x 1 month,  moderate muscle wasting (temporalis, pectoralis,  deltoid) and moderate fat loss (orbital, buccal, triceps)         Monitoring:  Telemetry needed: no  Continuous pulse oximetry needed: no  Level of Care: 4    Code Status: do not resuscitate    DISPO:  hospice    Family Contact: daughter  Care Plan discussed with patient's daughter, nurse at bedside, nursing, consultants, case Production designer, theatre/television/film, Press photographer.    MD/RN rounds: yes          Patient Lines/Drains/Airways Status       Active PICC Line / CVC Line / PIV Line / Drain / Airway / Intraosseous Line / Epidural Line / ART Line / Line / Wound / Pressure Ulcer / NG/OG Tube       Name Placement date Placement time Site Days    Permacath Catheter - Tunneled 08/23/22 Subclavian vein catheter Right 08/23/22  1047  -- 12    Peripheral IV 20 G Standard Anterior;Right Wrist --  --  Wrist  --    Peripheral IV 08/27/22 18 G Standard Anterior;Proximal;Right Upper Arm 08/27/22  0919  Upper Arm  8    Wound 06/30/22 Abdomen Anterior 06/30/22  1152  Abdomen  66    Wound 07/23/22 Skin Tear Buttocks;Coccyx Inner R and L cheek, coccyx 07/23/22  --  Buttocks;Coccyx  43                       SUBJECTIVE     Patient is lethargic, tachypneic with plenty oral secretions.     MEDICATIONS   Scheduled Meds:  Current Facility-Administered Medications   Medication Dose Route Frequency    glycopyrrolate  0.4 mg Intravenous Q6H    scopolamine  1 patch Transdermal Q72H     Continuous Infusions:   morphine 2 mg/hr (08/31/2022 1033)       ROS     Limited due to AMS    PHYSICAL EXAM     Vitals:    08/26/2022 1127   BP:    Pulse:    Resp: 22   Temp:    SpO2:        Temperature: Temp  Min: 98.2 F (36.8 C)  Max: 99.9 F (37.7 C)  Pulse: Pulse  Min: 86  Max: 107  Respiratory: Resp  Min: 12  Max: 26  Non-Invasive BP: BP  Min: 84/49  Max: 108/64  Pulse Oximetry SpO2  Min: 93 %  Max: 97 %    Intake and Output Summary (Last 24 hours) at Date Time    Intake/Output Summary (Last 24 hours) at 09/09/2022 1511  Last data filed at 09/03/2022 1635  Gross per 24 hour   Intake  --   Output 251 ml   Net -251 ml       GEN APPEARANCE: in moderate resp distress, lethargic, tachypnea   HEENT: Conjunctiva Clear  CVS: Tachycardic  LUNGS: Coarse breath sounds b/l  ABD: Soft; non distended  EXT: 1+ LE edema  SKIN: No rash or Lesions  NEURO: lethargic, not following commands      LABS     Recent Labs   Lab 09/03/22  0318 09/02/22  0341 09/01/22  0433   WBC 22.39* 19.89* 19.64*   RBC 2.87* 2.99* 2.93*   Hemoglobin 8.5* 8.8* 8.8*   Hematocrit 26.6* 27.8* 27.3*   MCV 92.7 93.0 93.2   Platelet Count 347* 296 310       Recent Labs   Lab 09/03/22  0318 09/02/22  0341 09/01/22  0433 08/31/22  0406 08/30/22  0549 08/29/22  0345   Sodium 139 136 137  --  139 142   Potassium 4.3 3.5 3.5  --  3.9 3.3*   Chloride 104 100 100  --  103 104   CO2 22 26 24   --  23 25   BUN 39* 21 24*  --  36* 27*   Creatinine 3.3* 2.4* 3.3*  --  4.9* 4.2*   Glucose 258* 235* 216*  --  93 72   Calcium 11.2* 9.7 9.9  --  9.5 10.2   Magnesium  --  1.8  --  1.9 1.9 1.9       Recent Labs   Lab 08/29/22  0345   Albumin 2.4*       Recent Labs   Lab 09/03/22  0318 09/02/22  2307 09/02/22  1910   hs Troponin 484.9* 512.6* 541.4*       Recent Labs   Lab 09/02/22  0341 09/01/22  0433 08/31/22  2115   PTT 28 47* 50*       Microbiology Results (last 15 days)       Procedure Component Value Units Date/Time    COVID-19 (SARS-COV-2) ([facility] Standard test) [[id number]]  (Normal) Collected: 08/31/22 1909    Order Status:  Completed Specimen: Swab from Anterior Nares Updated: 09/02/22 1323     SARS-CoV-2 (COVID-19) RNA Not Detected     Comment:   A result of "Detected" indicates Positive for the presence of SARS-CoV-2 RNA  A result of "Not Detected" indicates Negative for the presence of SARS-CoV-2 RNA  A result of "Presumptive Positive" indicates Positive for the presence of SARS-CoV-2 RNA    Test performed using the Roche cobas SARS-CoV-2 assay.  This assay is only for use under the Food and Drug Administration's Emergency Use Authorization. This is a  real-time RT-PCR assay for the qualitative detection of SARS-CoV-2 (COVID-19) RNA. Viral nucleic acids may persist in vivo, independent of viability. Detection of viral nucleic acid does not imply the presence of infectious virus, or that virus nucleic acid is the cause of clinical symptoms.   Test performance has not been established for immunocompromised patients or patients without signs and symptoms of respiratory infection. Negative results do not preclude SARS-CoV-2 infection and should not be used as the sole basis for patient management decisions. Invalid results may be due to inhibiting substances in the specimen and recollection should occur.    Please see Fact Sheets for patients and providers located at: http://www.rice.biz/       Culture, Methicillin Resistant Staphylococcus aureus (MRSA) [[id number]]  (Normal) Collected: 08/30/22 1441    Order Status: Completed Specimen: Swab from Nares Updated: 08/31/22 1823     Culture MRSA Surveillance No Methicillin Resistant Staphylococcus aureus isolated    Culture, Methicillin Resistant Staphylococcus aureus (MRSA) [[id number]]  (Normal) Collected: 08/30/22 1441    Order Status: Completed Specimen: Swab from Throat Updated: 08/31/22 1823     Culture MRSA Surveillance No Methicillin Resistant Staphylococcus aureus isolated             RADIOLOGY     Radiological Procedure xray personally reviewed and concur with radiologist reports unless stated otherwise.    XR Abdomen Portable   Final Result         Feeding tube in stomach      Kristine Linea, MD   08/31/2022 6:01 PM      CT Angiogram Chest   Final Result         1. No pulmonary embolism.   2. Emphysema. Bibasilar lung consolidation.   3. Coronary calcification. Mild cardiomegaly.   4. Moderate hiatal hernia.   5. Ascites. Left posterior lower chest wall/abdominal wall hernia noted.      Nelta Numbers, MD   08/27/2022 6:56 PM      XR Chest AP Portable   Final Result         1. Increasing airspace  opacity in the right lower lobe.   2. Stable pulmonary edema pattern.      Colonel Bald, MD   08/27/2022 6:47 AM      XR Chest AP Portable   Final Result      Mild interstitial edema.      Shelly Flatten, MD   08/26/2022 6:19 PM      Tunneled Cath Check/Change (Permcath)   Final Result      Right-sided PermCath exchange with new 14.5 French 19 cm PermCath with tip   in the right atrium.      PLAN:    Catheter ready for immediate use.      Darci Current, MD   08/23/2022 12:49 PM      XR Chest  AP Portable   Final Result      No significant  change mild pulmonary vascular congestion and mild basal   atelectasis. Hiatal hernia.      Clide Cliff, MD   08/21/2022 2:35 PM          Signed,  Dionicia Abler, MD  3:11 PM 09/13/2022

## 2022-09-19 NOTE — Progress Notes (Signed)
Pt admitted to medical unit room 493 from PCU around 2200. On morphine drip on arrival. Tube feed discontinued on transferring unit as pt is comfort care only. Feeding tube removed. Versed given for restlessness. Family at the bedside. Will continue to monitor and keep patient comfortable as possible.

## 2022-09-19 NOTE — Death Summary (Signed)
DEATH SUMMARY    Date Time: 08/24/2022  7:16 AM  Patient Name: Shelby Barron  Attending Physician: No att. providers found  Primary Care Physician: Patsy Lager, MD    Date of Admission:                                                                         08/19/2022   LOS: 14 days      Reason for Admission:   Malfunctioning HD catheter  Non healing abdominal wound  Problem List:                                                                                   Principal Diagnosis (Diagnosis after study, that is chiefly responsible for admission to inpatient status): Hemodialysis catheter dysfunction, initial encounter      Hospital Course:                                                                              Acute metabolic encephalopathy s/p RRT on 8/16  Hypotension  Acute Hypoxic Respiratory Failure  Aspiration PNA (acute)  Volume overload  Acute Pulmonary edema   Paroxysmal A-Fib   Elevated Troponin, most likely demand ischemia in setting of current infection and hx of ESRD   Non-healing abdominal surgical wound s/p I&D 09/08/2022 by Dr. Morton Stall bowel enterotomy s/p small bowel anastomosis, abd closure, wound vac  Acute blood loss anemia 2/to abdominal incision and drainage  Anemia of chronic kidney disease, stable  Iron deficiency anemia  Thrombocytosis, suspect reactive  Malfunctioning HD Catheter  [name] MWF  H/o RCC s/p bilateral nephrectomy  Urothelial CA s/p cystectomy  Chronic HFpEF  HTN  HLD  H/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  Deconditioned state s/p prolonged hospitalisation   Severe Protein Calorie Malnutrition         Patient transitioned to CMO ON 8/16  Increased morphine drip 1-->2 mg/hr  Cont versed IV prn  Added morphine prn  Cont scopolamine patch  Add Rubinol  Consult placed for hospice, GIP eval  Overall poor prognosis, death was expected this admission.         Procedures Performed:  Procedure(s):  DEBRIDEMENT & IRRIGATION, WOUND  WOUND VAC APPLICATION    13 Days Post-Op  -------------------    Procedure(s):  DEBRIDEMENT & IRRIGATION, WOUND  WOUND VAC APPLICATION    11 Days Post-Op  -------------------       Time of Death:                                                                                    Patient pronounced deceased at 10:50 PM on 2022/09/14.       Likely Cause of Death:                                                                      Acute hypoxic respiratory failure    Was Death Expected:                                                                      Death was expected.    Code Status: NO CPR  -  ALLOW NATURAL DEATH    Autopsy:   No Autopsy    Treatment Team:                                                                              Treatment Team:   Debbe Mounts, MD  [place]linger, Ollen Gross, MD  [facility], Camelia Phenes, MD  You, Loren Racer, MD  Christene Slates, MD  Marene Lenz, Sophie Charolotte Eke, RN    Signed by: Dionicia Abler, MD    cc:Pcp, Kathreen Cosier, MD

## 2022-09-19 NOTE — Progress Notes (Signed)
Pt restless and moaning at times, has a congested cough with thick secretion. Versed given x3,with minimal effect. Reached for providers x2 for prn pain medications or an increase of the dose of morphine drip. Received only one time doses of morphine twice. Symptoms improved with additional doses of morphine.

## 2022-09-19 NOTE — Death Pronouncement Note (Signed)
I was called to see the patient because of no vital signs. Shelby Barron is a 82 years old lady with DNR status and on comfort measures. Upon my arrival the patient had no neurological responses. Pupils were dilated with no response to light. The patient had no respiratory efforts or heart beats during 1 minute auscultation. I pronounced the patient dead at 10:50 PM on September 18, 2022.  No family members at bedside.

## 2022-09-19 NOTE — Progress Notes (Signed)
[phone] / Available 24 hours    Hospice Follow Up Visit:    Date Time: 08/19/2022 1:54 PM   Patient Name: Shelby Barron   Present at Visit: [name], dtr via phone     Visit Outcome:   [facility] referral received.  [facility] visited at bedside and the only family member present is the pt's sister.  Call placed to dtr [name] to set up hospice info session and she stated the spouse will need to be involved in hospice meeting.  Dtr requesting meeting at about 12:30p tomorrow at bedside.  Hospital team requesting GIP evaluation.      Next Steps:   Please call [facility] with questions.      Signed by: Gregery Na

## 2022-09-19 NOTE — Nursing Progress Note (Signed)
Attempted to call daughter and home phone 6 times and phone is going straight to voicemail. This RN left a voicemail for the daughter to call me back. Awaiting phone call.

## 2022-09-19 NOTE — Plan of Care (Addendum)
A&O UTA, responds to voice, non-verbal, lethargic. pt on comfort measure only, taking 1L NC oxygen, on 2mg /hr IV morphine drip. Suctioned pt frequently per protocol. Given prn meds morphine 2 mg/ml, midazolam 1mg , and scheduled Robinal for cough.     Problem: Moderate/High Fall Risk Score >5  Goal: Patient will remain free of falls  Flowsheets (Taken 09/13/2022 1840)  High (Greater than 13):   HIGH-Consider use of low bed   HIGH-Apply yellow "Fall Risk" arm band   HIGH-Bed alarm on at all times while patient in bed   MOD-Place Fall Risk level on whiteboard in room   MOD-Include family in multidisciplinary POC discussions   HIGH-Visual cue at entrance to patient's room     Problem: Compromised Sensory Perception  Goal: Sensory Perception Interventions  Flowsheets (Taken 08/22/2022 1840)  Sensory Perception Interventions: Offload heels, Pad bony prominences, Reposition q 2hrs/turn Clock, Q2 hour skin assessment under devices if present     Problem: Pain interferes with ability to perform ADL  Goal: Pain at adequate level as identified by patient  Flowsheets (Taken 09/05/2022 1840)  Pain at adequate level as identified by patient:   Assess pain on admission, during daily assessment and/or before any "as needed" intervention(s)   Assess for risk of opioid induced respiratory depression, including snoring/sleep apnea. Alert healthcare team of risk factors identified.   Identify patient comfort function goal   Reassess pain within 30-60 minutes of any procedure/intervention, per Pain Assessment, Intervention, Reassessment (AIR) Cycle   Evaluate if patient comfort function goal is met   Evaluate patient's satisfaction with pain management progress   Offer non-pharmacological pain management interventions   Include patient/patient care companion in decisions related to pain management as needed     Problem: Inadequate Gas Exchange  Goal: Patent Airway maintained  Flowsheets (Taken 09/03/2022 1840)  Patent airway maintained:    Reposition patient every 2 hours and as needed unless able to self-reposition   Reinforce use of ordered respiratory interventions (i.e. CPAP, BiPAP, Incentive Spirometer, Acapella, etc.)   Suction secretions as needed   Provide adequate fluid intake to liquefy secretions   Position patient for maximum ventilatory efficiency     Problem: Hemodynamic Status: Cardiac  Goal: Stable vital signs and fluid balance  Flowsheets (Taken 09/17/2022 1840)  Stable vital signs and fluid balance:   Monitor lab values   Assess signs and symptoms associated with cardiac rhythm changes     Problem: Pain  Goal: Pain at adequate level as identified by patient  Flowsheets (Taken 08/25/2022 1840)  Pain at adequate level as identified by patient:   Assess pain on admission, during daily assessment and/or before any "as needed" intervention(s)   Assess for risk of opioid induced respiratory depression, including snoring/sleep apnea. Alert healthcare team of risk factors identified.   Identify patient comfort function goal   Reassess pain within 30-60 minutes of any procedure/intervention, per Pain Assessment, Intervention, Reassessment (AIR) Cycle   Evaluate if patient comfort function goal is met   Evaluate patient's satisfaction with pain management progress   Offer non-pharmacological pain management interventions   Include patient/patient care companion in decisions related to pain management as needed

## 2022-09-19 NOTE — Plan of Care (Signed)
Problem: Moderate/High Fall Risk Score >5  Goal: Patient will remain free of falls  Outcome: Progressing  Flowsheets (Taken 08/21/2022 2001)  High (Greater than 13):   HIGH-Visual cue at entrance to patient's room   HIGH-Bed alarm on at all times while patient in bed   HIGH-Apply yellow "Fall Risk" arm band     Problem: Compromised Sensory Perception  Goal: Sensory Perception Interventions  Outcome: Progressing  Flowsheets (Taken 09/14/2022 2001)  Sensory Perception Interventions: Offload heels, Pad bony prominences, Reposition q 2hrs/turn Clock, Q2 hour skin assessment under devices if present     Problem: Compromised Moisture  Goal: Moisture level Interventions  Outcome: Progressing  Flowsheets (Taken 09/15/2022 2001)  Moisture level Interventions: Moisture wicking products, Moisture barrier cream     Problem: Compromised Activity/Mobility  Goal: Activity/Mobility Interventions  Outcome: Progressing  Flowsheets (Taken 09/09/2022 2001)  Activity/Mobility Interventions: Pad bony prominences, TAP Seated positioning system when OOB, Promote PMP, Reposition q 2 hrs / turn clock, Offload heels     Problem: Compromised Nutrition  Goal: Nutrition Interventions  Outcome: Progressing  Flowsheets (Taken 09/07/2022 2001)  Nutrition Interventions: Discuss nutrition at Rounds, I&Os, Document % meal eaten, Daily weights     Problem: Compromised Friction/Shear  Goal: Friction and Shear Interventions  Outcome: Progressing  Flowsheets (Taken 09/14/2022 2001)  Friction and Shear Interventions: Pad bony prominences, Off load heels, HOB 30 degrees or less unless contraindicated, Consider: TAP seated positioning, Heel foams     Problem: Compromised skin integrity  Goal: Skin integrity is maintained or improved  Outcome: Progressing  Flowsheets (Taken 09/05/2022 2126)  Skin integrity is maintained or improved:   Assess Braden Scale every shift   Turn or reposition patient every 2 hours or as needed unless able to reposition self   Relieve  pressure to bony prominences   Avoid shearing   Keep skin clean and dry   Collaborate with Wound, Ostomy, and Continence Nurse   Keep head of bed 30 degrees or less (unless contraindicated)     Problem: Pain interferes with ability to perform ADL  Goal: Pain at adequate level as identified by patient  Outcome: Progressing  Flowsheets (Taken 09/03/2022 2126)  Pain at adequate level as identified by patient:   Identify patient comfort function goal   Assess for risk of opioid induced respiratory depression, including snoring/sleep apnea. Alert healthcare team of risk factors identified.   Assess pain on admission, during daily assessment and/or before any "as needed" intervention(s)   Reassess pain within 30-60 minutes of any procedure/intervention, per Pain Assessment, Intervention, Reassessment (AIR) Cycle   Evaluate if patient comfort function goal is met   Evaluate patient's satisfaction with pain management progress   Offer non-pharmacological pain management interventions   Include patient/patient care companion in decisions related to pain management as needed     Problem: Inadequate Gas Exchange  Goal: Adequate oxygenation and improved ventilation  Outcome: Progressing  Flowsheets (Taken 09/12/2022 2126)  Adequate oxygenation and improved ventilation:   Assess lung sounds   Monitor SpO2 and treat as needed  Goal: Patent Airway maintained  Outcome: Progressing  Flowsheets (Taken 08/26/2022 2126)  Patent airway maintained:   Position patient for maximum ventilatory efficiency   Suction secretions as needed   Reposition patient every 2 hours and as needed unless able to self-reposition     Problem: Pain  Goal: Pain at adequate level as identified by patient  Outcome: Progressing  Flowsheets (Taken 08/31/2022 2126)  Pain at adequate level as identified by  patient:   Identify patient comfort function goal   Assess for risk of opioid induced respiratory depression, including snoring/sleep apnea. Alert healthcare team of  risk factors identified.   Assess pain on admission, during daily assessment and/or before any "as needed" intervention(s)   Reassess pain within 30-60 minutes of any procedure/intervention, per Pain Assessment, Intervention, Reassessment (AIR) Cycle   Evaluate if patient comfort function goal is met   Evaluate patient's satisfaction with pain management progress   Offer non-pharmacological pain management interventions   Include patient/patient care companion in decisions related to pain management as needed

## 2022-09-19 DEATH — deceased
